# Patient Record
Sex: Female | Born: 1955 | Race: White | Hispanic: No | Marital: Married | Smoking: Never smoker
Health system: Southern US, Community
[De-identification: ages and names within clinical notes are randomized; demographics above are authoritative.]

## PROBLEM LIST (undated history)

## (undated) DIAGNOSIS — I5189 Other ill-defined heart diseases: Secondary | ICD-10-CM

## (undated) DIAGNOSIS — R350 Frequency of micturition: Secondary | ICD-10-CM

## (undated) DIAGNOSIS — I779 Disorder of arteries and arterioles, unspecified: Secondary | ICD-10-CM

## (undated) DIAGNOSIS — R319 Hematuria, unspecified: Secondary | ICD-10-CM

## (undated) DIAGNOSIS — R519 Headache, unspecified: Secondary | ICD-10-CM

## (undated) DIAGNOSIS — F329 Major depressive disorder, single episode, unspecified: Secondary | ICD-10-CM

## (undated) DIAGNOSIS — M1990 Unspecified osteoarthritis, unspecified site: Secondary | ICD-10-CM

## (undated) DIAGNOSIS — E785 Hyperlipidemia, unspecified: Secondary | ICD-10-CM

## (undated) DIAGNOSIS — L03211 Cellulitis of face: Secondary | ICD-10-CM

## (undated) DIAGNOSIS — K589 Irritable bowel syndrome without diarrhea: Secondary | ICD-10-CM

## (undated) DIAGNOSIS — I639 Cerebral infarction, unspecified: Secondary | ICD-10-CM

## (undated) DIAGNOSIS — E119 Type 2 diabetes mellitus without complications: Secondary | ICD-10-CM

## (undated) DIAGNOSIS — R0789 Other chest pain: Secondary | ICD-10-CM

## (undated) DIAGNOSIS — K219 Gastro-esophageal reflux disease without esophagitis: Secondary | ICD-10-CM

## (undated) DIAGNOSIS — R42 Dizziness and giddiness: Secondary | ICD-10-CM

## (undated) DIAGNOSIS — T7840XA Allergy, unspecified, initial encounter: Secondary | ICD-10-CM

## (undated) DIAGNOSIS — R351 Nocturia: Secondary | ICD-10-CM

## (undated) DIAGNOSIS — L0201 Cutaneous abscess of face: Secondary | ICD-10-CM

## (undated) DIAGNOSIS — I739 Peripheral vascular disease, unspecified: Secondary | ICD-10-CM

## (undated) DIAGNOSIS — G459 Transient cerebral ischemic attack, unspecified: Secondary | ICD-10-CM

## (undated) DIAGNOSIS — R32 Unspecified urinary incontinence: Secondary | ICD-10-CM

## (undated) DIAGNOSIS — R609 Edema, unspecified: Secondary | ICD-10-CM

## (undated) DIAGNOSIS — R51 Headache: Secondary | ICD-10-CM

## (undated) DIAGNOSIS — R569 Unspecified convulsions: Secondary | ICD-10-CM

## (undated) DIAGNOSIS — I2510 Atherosclerotic heart disease of native coronary artery without angina pectoris: Secondary | ICD-10-CM

## (undated) DIAGNOSIS — R3915 Urgency of urination: Secondary | ICD-10-CM

## (undated) DIAGNOSIS — F32A Depression, unspecified: Secondary | ICD-10-CM

## (undated) DIAGNOSIS — I10 Essential (primary) hypertension: Secondary | ICD-10-CM

## (undated) DIAGNOSIS — G43909 Migraine, unspecified, not intractable, without status migrainosus: Secondary | ICD-10-CM

## (undated) HISTORY — DX: Depression, unspecified: F32.A

## (undated) HISTORY — DX: Essential (primary) hypertension: I10

## (undated) HISTORY — DX: Gastro-esophageal reflux disease without esophagitis: K21.9

## (undated) HISTORY — DX: Unspecified urinary incontinence: R32

## (undated) HISTORY — DX: Hyperlipidemia, unspecified: E78.5

## (undated) HISTORY — PX: CATARACT EXTRACTION: SUR2

## (undated) HISTORY — DX: Edema, unspecified: R60.9

## (undated) HISTORY — PX: TOE SURGERY: SHX1073

## (undated) HISTORY — PX: EYE SURGERY: SHX253

## (undated) HISTORY — PX: CHOLECYSTECTOMY: SHX55

## (undated) HISTORY — PX: APPENDECTOMY: SHX54

## (undated) HISTORY — DX: Urgency of urination: R39.15

## (undated) HISTORY — DX: Unspecified osteoarthritis, unspecified site: M19.90

## (undated) HISTORY — DX: Cellulitis of face: L03.211

## (undated) HISTORY — PX: ABDOMINAL HYSTERECTOMY: SHX81

## (undated) HISTORY — PX: ABDOMINOPLASTY: SUR9

## (undated) HISTORY — DX: Cerebral infarction, unspecified: I63.9

## (undated) HISTORY — PX: SHOULDER SURGERY: SHX246

## (undated) HISTORY — DX: Frequency of micturition: R35.0

## (undated) HISTORY — DX: Major depressive disorder, single episode, unspecified: F32.9

## (undated) HISTORY — DX: Hematuria, unspecified: R31.9

## (undated) HISTORY — PX: WRIST FRACTURE SURGERY: SHX121

## (undated) HISTORY — DX: Migraine, unspecified, not intractable, without status migrainosus: G43.909

## (undated) HISTORY — DX: Cutaneous abscess of face: L02.01

## (undated) HISTORY — DX: Nocturia: R35.1

## (undated) HISTORY — PX: UPPER GI ENDOSCOPY: SHX6162

## (undated) HISTORY — DX: Irritable bowel syndrome, unspecified: K58.9

## (undated) HISTORY — PX: OTHER SURGICAL HISTORY: SHX169

## (undated) HISTORY — PX: BACK SURGERY: SHX140

## (undated) HISTORY — PX: HERNIA REPAIR: SHX51

## (undated) HISTORY — DX: Allergy, unspecified, initial encounter: T78.40XA

---

## 1998-07-17 DIAGNOSIS — I639 Cerebral infarction, unspecified: Secondary | ICD-10-CM

## 1998-07-17 HISTORY — DX: Cerebral infarction, unspecified: I63.9

## 2002-12-13 ENCOUNTER — Ambulatory Visit (HOSPITAL_COMMUNITY): Admission: RE | Admit: 2002-12-13 | Discharge: 2002-12-13 | Payer: Self-pay | Admitting: Neurosurgery

## 2003-02-02 ENCOUNTER — Encounter: Admission: RE | Admit: 2003-02-02 | Discharge: 2003-02-02 | Payer: Self-pay | Admitting: Neurosurgery

## 2003-02-02 ENCOUNTER — Encounter: Payer: Self-pay | Admitting: Diagnostic Radiology

## 2003-02-24 ENCOUNTER — Encounter: Admission: RE | Admit: 2003-02-24 | Discharge: 2003-02-24 | Payer: Self-pay | Admitting: Neurosurgery

## 2004-04-16 ENCOUNTER — Encounter: Payer: Self-pay | Admitting: Neurology

## 2004-05-17 ENCOUNTER — Encounter: Payer: Self-pay | Admitting: Neurology

## 2004-06-16 ENCOUNTER — Encounter: Payer: Self-pay | Admitting: Neurology

## 2004-07-06 ENCOUNTER — Encounter: Payer: Self-pay | Admitting: Family Medicine

## 2004-07-22 ENCOUNTER — Encounter: Payer: Self-pay | Admitting: Family Medicine

## 2004-08-17 ENCOUNTER — Encounter: Payer: Self-pay | Admitting: Family Medicine

## 2005-01-25 ENCOUNTER — Emergency Department: Payer: Self-pay | Admitting: Emergency Medicine

## 2005-07-28 ENCOUNTER — Ambulatory Visit: Payer: Self-pay | Admitting: Family Medicine

## 2005-08-14 ENCOUNTER — Inpatient Hospital Stay: Payer: Self-pay | Admitting: Internal Medicine

## 2005-08-14 ENCOUNTER — Other Ambulatory Visit: Payer: Self-pay

## 2005-09-20 ENCOUNTER — Ambulatory Visit: Payer: Self-pay

## 2005-11-05 ENCOUNTER — Other Ambulatory Visit: Payer: Self-pay

## 2005-11-05 ENCOUNTER — Emergency Department: Payer: Self-pay | Admitting: Emergency Medicine

## 2006-01-30 ENCOUNTER — Ambulatory Visit: Payer: Self-pay | Admitting: Ophthalmology

## 2006-02-25 ENCOUNTER — Emergency Department: Payer: Self-pay | Admitting: Emergency Medicine

## 2006-05-24 ENCOUNTER — Ambulatory Visit: Payer: Self-pay | Admitting: Family Medicine

## 2006-11-24 IMAGING — CR DG RIBS 2V*R*
1 series · 5 of 5 positions shown · non-contrast
Comparison: none

REASON FOR EXAM: rt rib pain rm 16
COMMENTS:

[Series 1: view not recorded · 0.17mm/px · 5 of 5 slices shown]
[im 1/5]
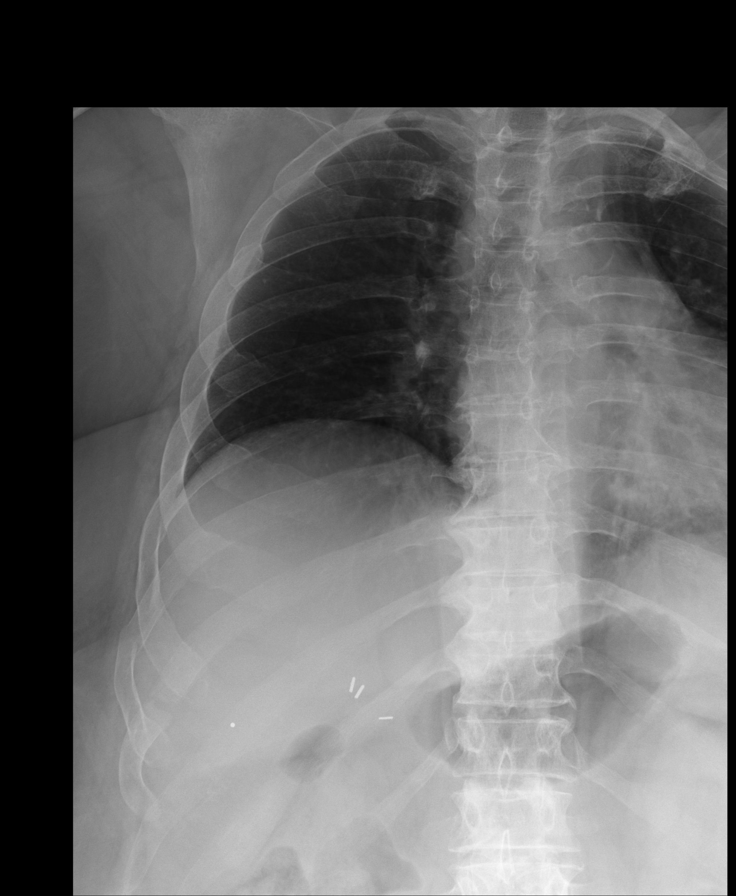
[im 2/5]
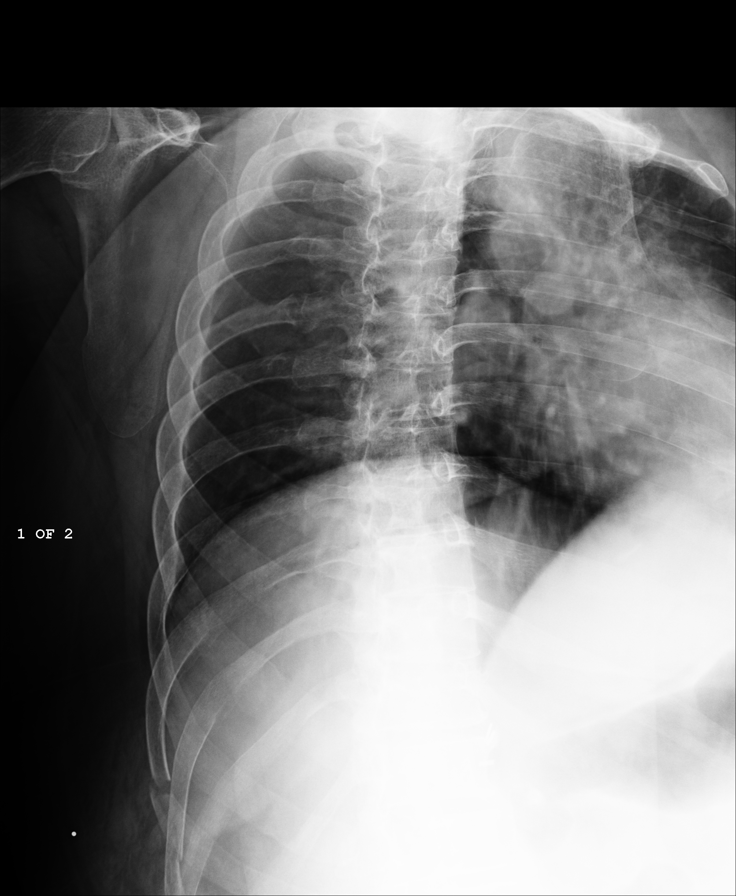
[im 3/5]
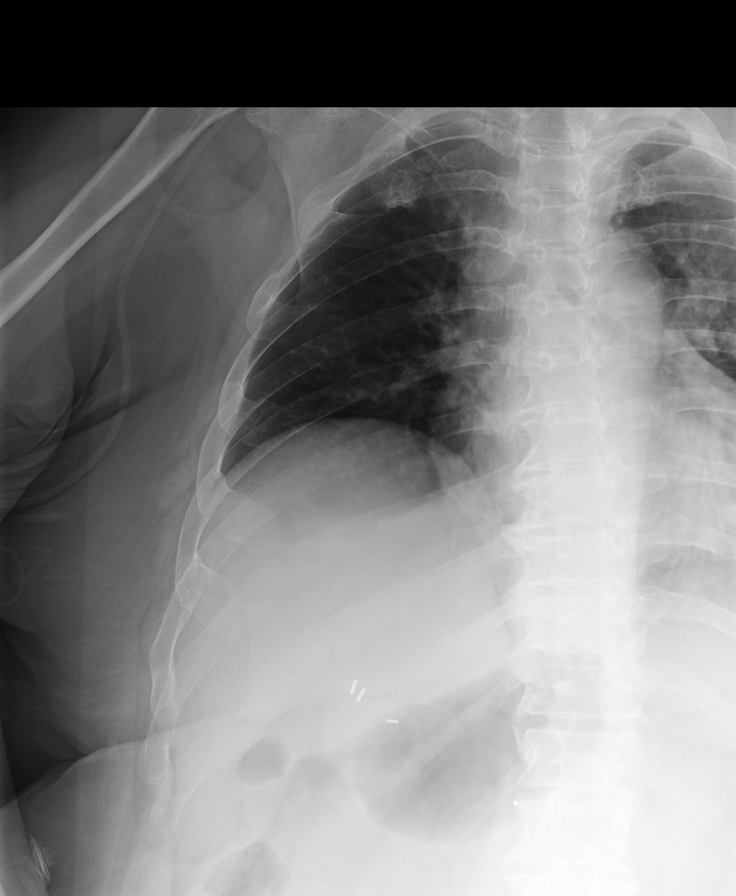
[im 4/5]
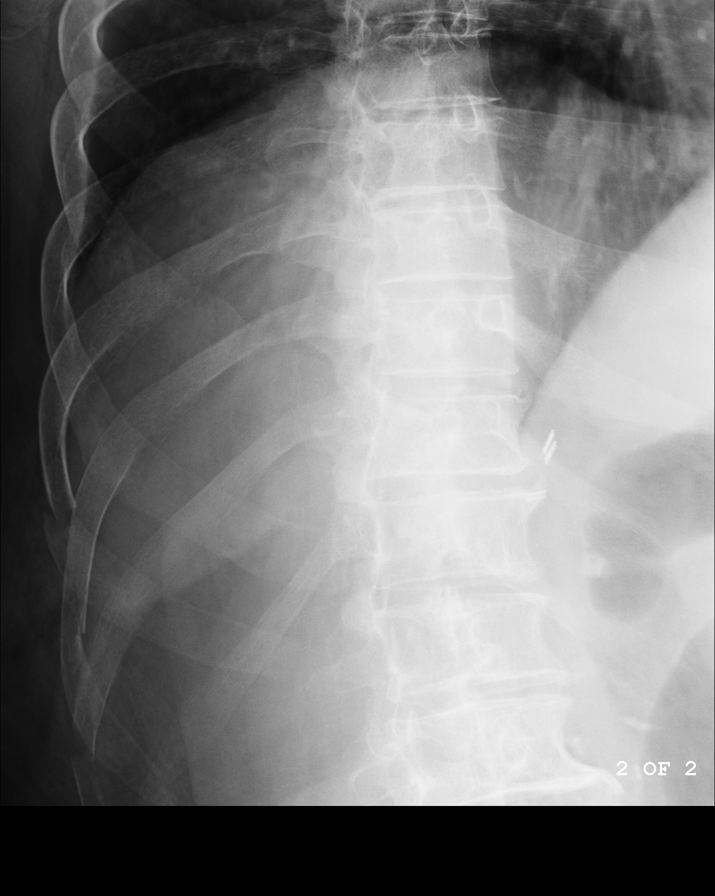
[im 5/5]
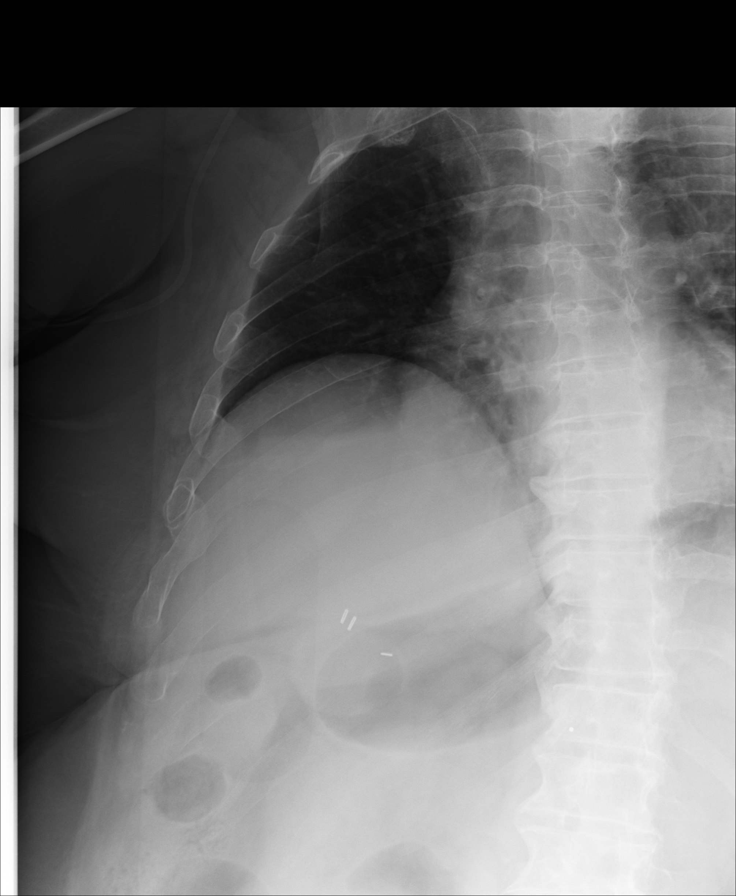

[5 of 5 positions shown; findings below may reference images not displayed]

PROCEDURE:     DXR - DXR RIBS RIGHT UNILATERAL  - January 25, 2005  [DATE]

RESULT:        There are noted minimally displaced fractures of the eighth,
ninth and tenth RIGHT ribs.   Also noted is an old appearing fracture of the
third RIGHT rib laterally.   No pneumothorax or pleural effusion on the
RIGHT is seen.
IMPRESSION: There are fractures of the eighth, ninth and tenth RIGHT ribs as noted above.

## 2007-01-02 ENCOUNTER — Ambulatory Visit: Payer: Self-pay | Admitting: Gastroenterology

## 2007-01-15 ENCOUNTER — Ambulatory Visit: Payer: Self-pay | Admitting: Internal Medicine

## 2007-01-28 ENCOUNTER — Ambulatory Visit: Payer: Self-pay | Admitting: Internal Medicine

## 2007-02-15 ENCOUNTER — Ambulatory Visit: Payer: Self-pay | Admitting: Internal Medicine

## 2007-02-21 ENCOUNTER — Other Ambulatory Visit: Payer: Self-pay

## 2007-02-21 ENCOUNTER — Ambulatory Visit: Payer: Self-pay | Admitting: Podiatry

## 2007-02-25 ENCOUNTER — Ambulatory Visit: Payer: Self-pay | Admitting: Family Medicine

## 2007-02-27 ENCOUNTER — Ambulatory Visit: Payer: Self-pay | Admitting: Podiatry

## 2007-03-19 ENCOUNTER — Inpatient Hospital Stay: Payer: Self-pay | Admitting: Internal Medicine

## 2007-03-19 ENCOUNTER — Other Ambulatory Visit: Payer: Self-pay

## 2007-05-03 ENCOUNTER — Ambulatory Visit: Payer: Self-pay | Admitting: Family Medicine

## 2007-05-27 IMAGING — CT CT CHEST-ABD W/ CM
1 of 2 series · 13 of 32 positions shown, 18 images · non-contrast
Comparison: none

REASON FOR EXAM: RUQ pain and side pain, weight loss_ pt. allergic to dye_
CT relayed pre-med...
COMMENTS:

[Series 2: soft tissue · axial · 0.82mm/px · z∈[-744,-364]mm · 13 of 88 slices shown, 18 images]
[im 6/88  mediastinal]
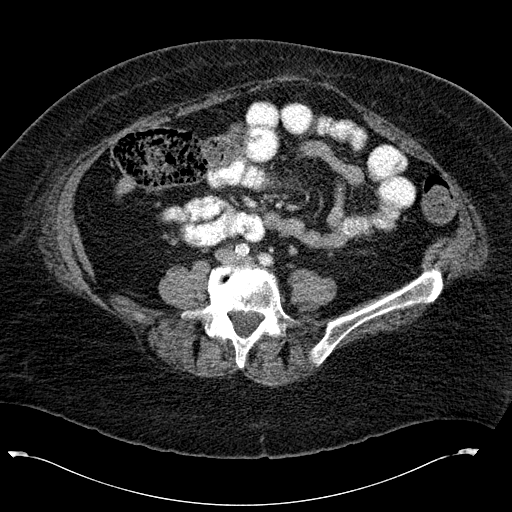
[im 6/88  bone]
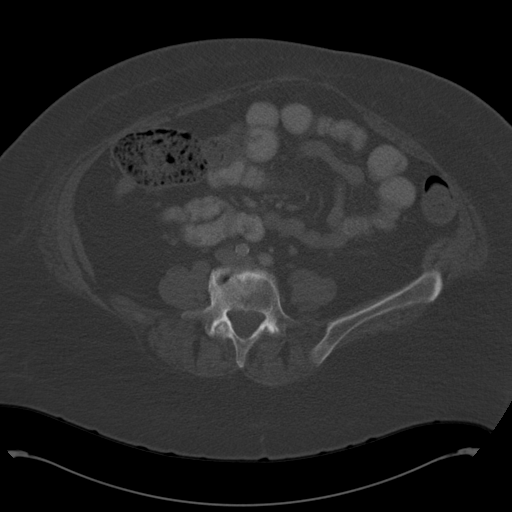
[im 17/88  mediastinal]
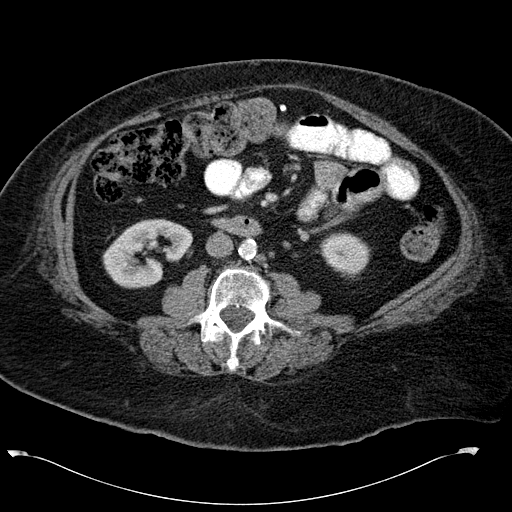
[im 22/88  mediastinal]
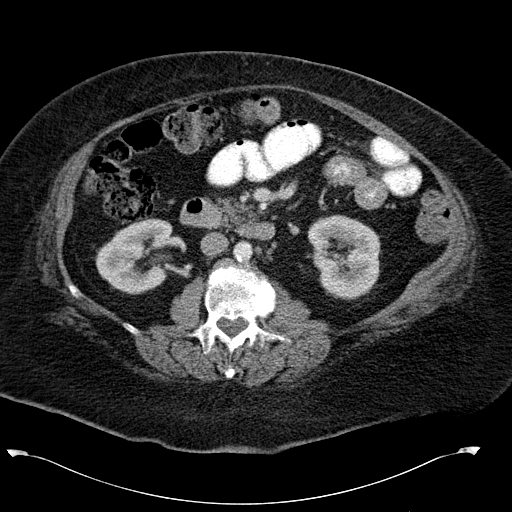
[im 30/88  mediastinal]
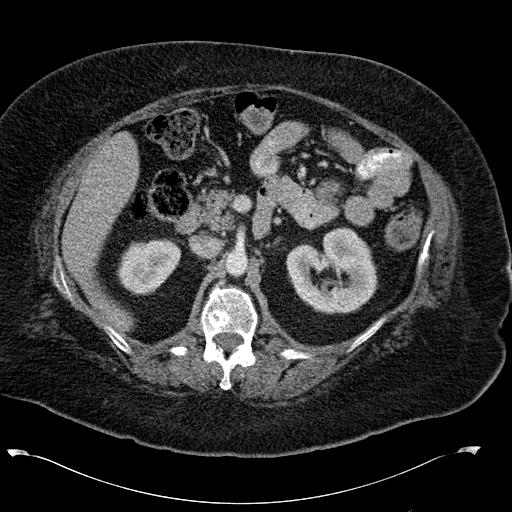
[im 33/88  mediastinal]
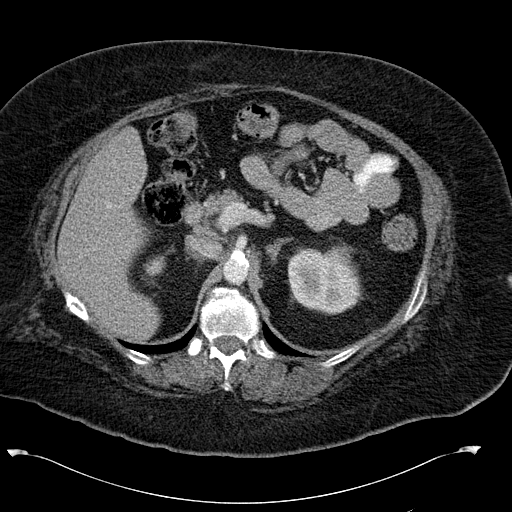
[im 43/88  mediastinal]
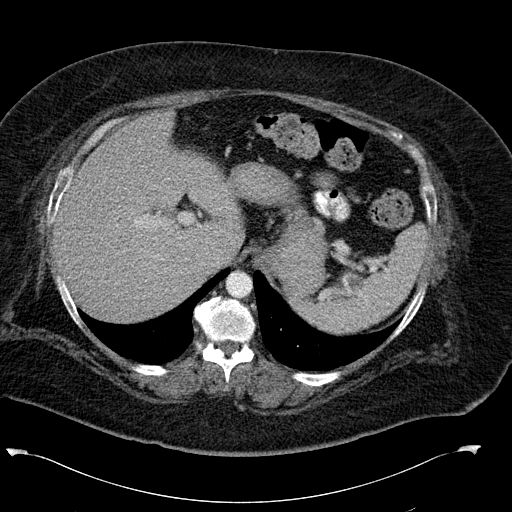
[im 44/88  mediastinal]
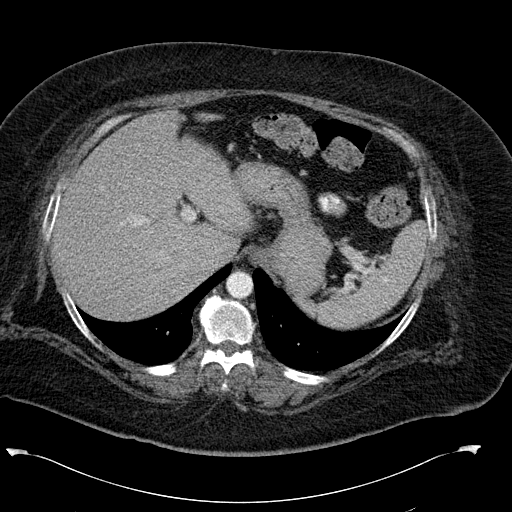
[im 55/88  mediastinal]
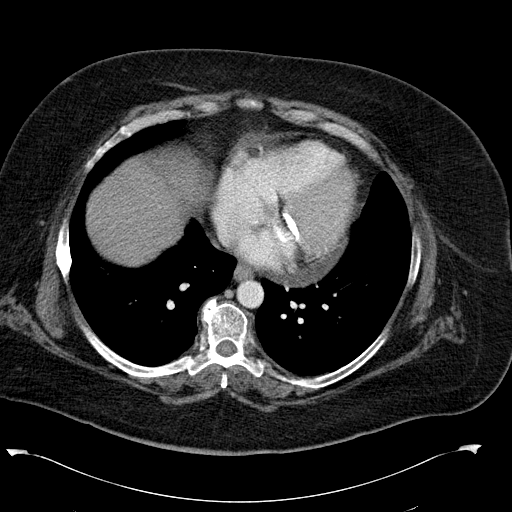
[im 59/88  mediastinal]
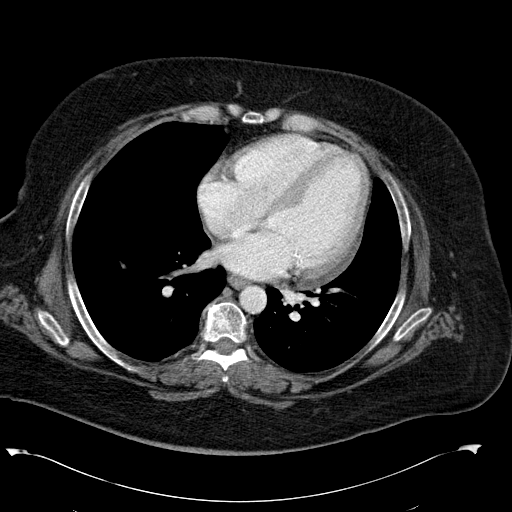
[im 59/88  bone]
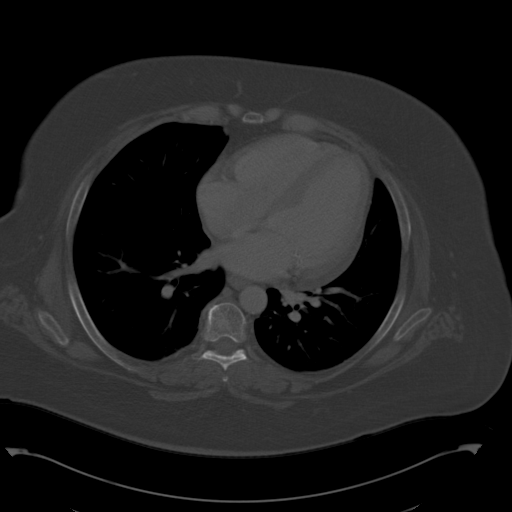
[im 66/88  mediastinal]
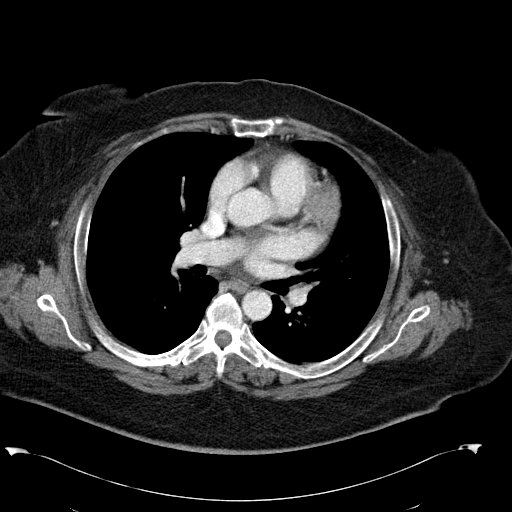
[im 66/88  lung]
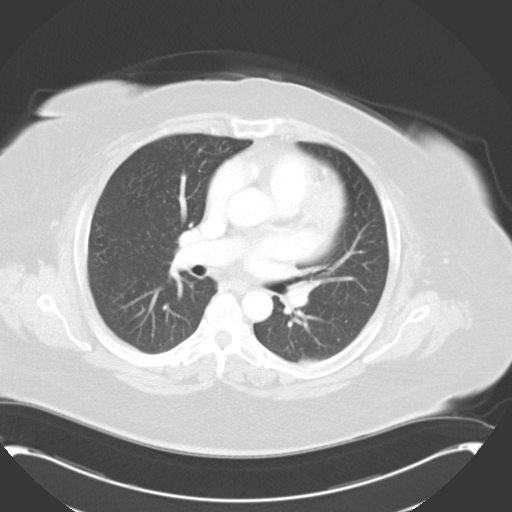
[im 71/88  mediastinal]
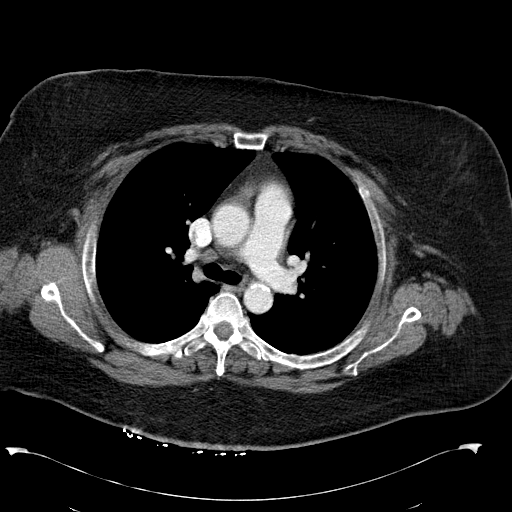
[im 71/88  lung]
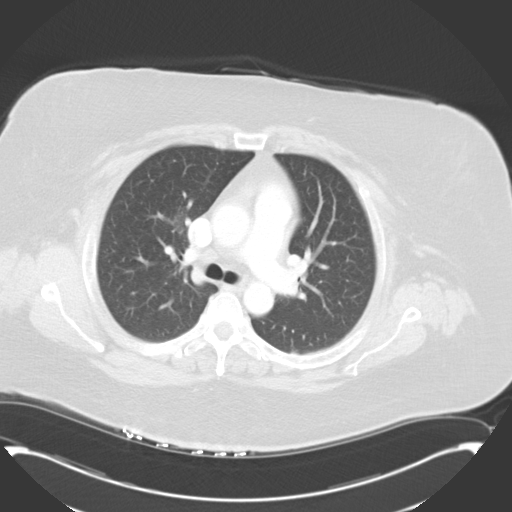
[im 77/88  lung]
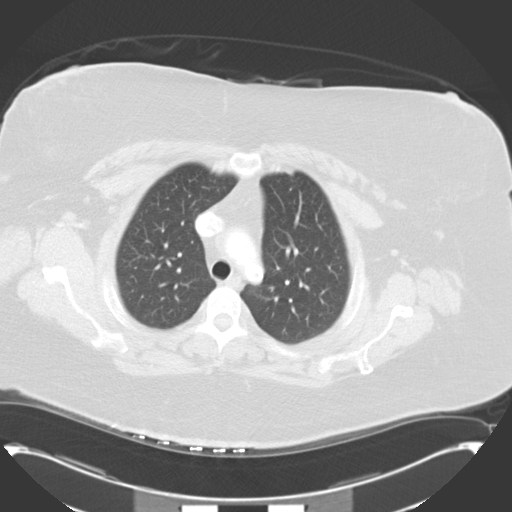
[im 82/88  mediastinal]
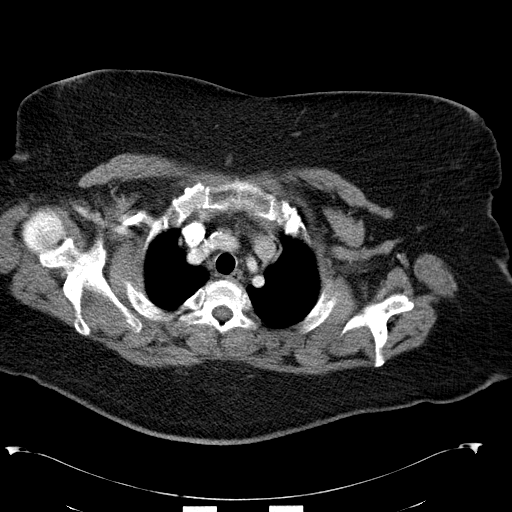
[im 82/88  lung]
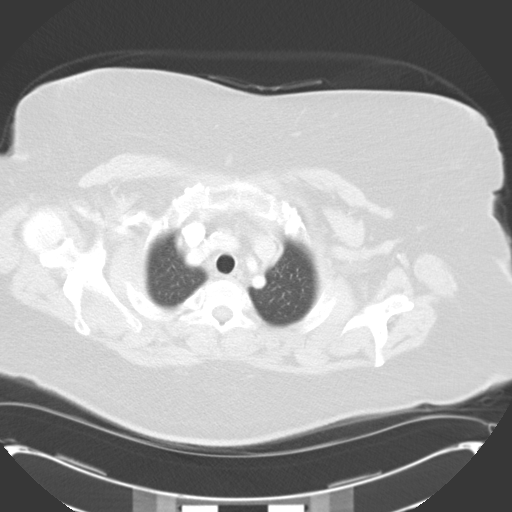

[13 of 32 positions shown; findings below may reference images not displayed]

PROCEDURE:     CT  - CT CHEST AND ABDOMEN W  - July 28, 2005  [DATE]

RESULT:     The patient has unexplained RIGHT upper quadrant discomfort as
well as weight loss. The patient received premedication due to contrast
sensitivity.  The patient received 100 ml of Isovue 370 for this study.

CT SCAN OF THE CHEST:  Within the mediastinum I see no pathologic sized
lymph nodes.  The cardiac chambers are normal in size. There is no pleural
nor pericardial effusion. The caliber of the thoracic aorta is normal. The
pulmonary apices exhibit very minimal scarring on the RIGHT at lung window
settings.  Elsewhere, the lungs exhibit no evidence of atelectasis or
infiltrate nor abnormal nodules. No bronchiectasis is evident. There is no
intrathoracic goiter. I see no axillary lymphadenopathy.

CT SCAN OF THE ABDOMEN:  The patient apparently ingested only a small amount
of the oral contrast.  None is present in the upper gastrointestinal tract.
There is some in the distal mid and distal small bowel, but none is in the
colon.  The liver exhibits normal density with no mass or ductal dilation.
The gallbladder is surgically absent.  The pancreas exhibits no focal mass
nor evidence of inflammatory change.  The spleen, non-distended stomach,
adrenal glands, and kidneys are normal in appearance. The caliber of the
thoracic aorta is normal. The periaortic and pericaval regions appeared
normal.  The partially contrast filled loops of small bowel are normal where
visualized.  The observed portions of the colon appeared normal. No ascites
is seen. There are no findings suspicious for omental caking.
IMPRESSION: 1)I do not see evidence of CHF nor of pneumonia.

2)I see no evidence of mediastinal nor hilar mass.

3)Not mentioned above is moderate degenerative change of the thoracic spine
manifested by osteophytes.

4)I do not see evidence of an intraabdominal mass.

5)I do not see evidence of acute hepatobiliary disease nor acute renal
abnormality.

6)The observed portions of the bowel are normal in appearance. Please note
that this study did not include the pelvis.

## 2007-06-14 IMAGING — MR MRI HEAD WITHOUT AND WITH CONTRAST
9 series · 48 of 48 positions shown · IV contrast (20ML MAGNEVIST)
Comparison: none

REASON FOR EXAM: cva
COMMENTS:

[Series 3: t1_sag · sagittal · 5.0mm · 0.86mm/px · 4 of 20 slices shown]
[im 1/20]
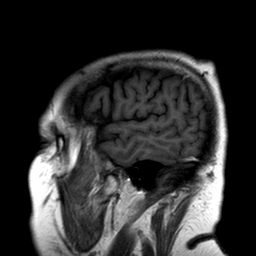
[im 7/20]
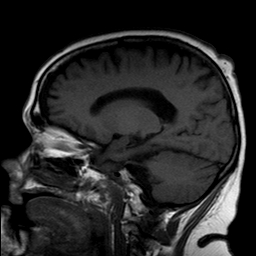
[im 13/20]
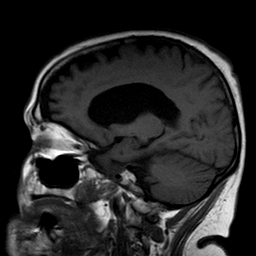
[im 20/20]
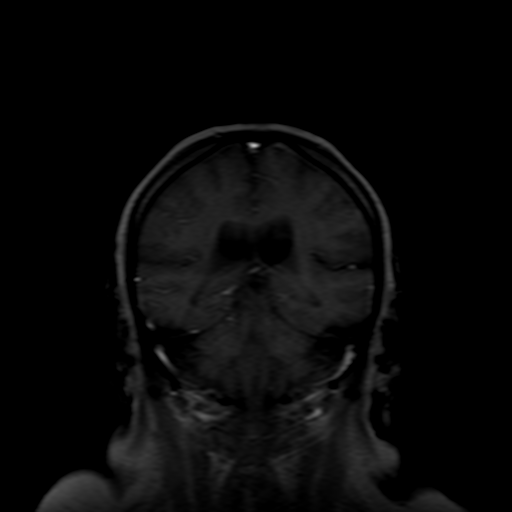

[Series 11: T1 · axial · 5.0mm · 0.86mm/px · z∈[-63,+95]mm · 5 of 24 slices shown (1 of 3)]
[im 1/24]
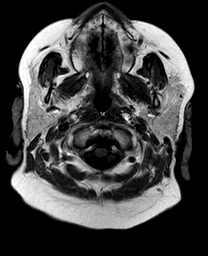
[im 6/24]
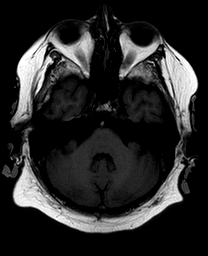
[im 12/24]
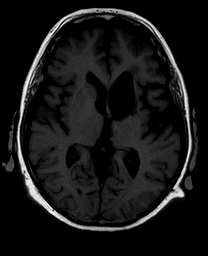
[im 18/24]
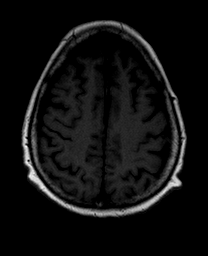
[im 24/24]
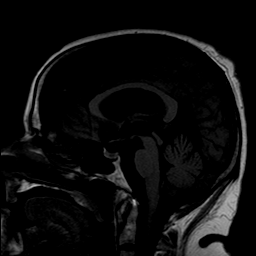

[Series 13: T2 · axial · 5.0mm · 0.43mm/px · z∈[-63,+95]mm · 5 of 24 slices shown]
[im 1/24]
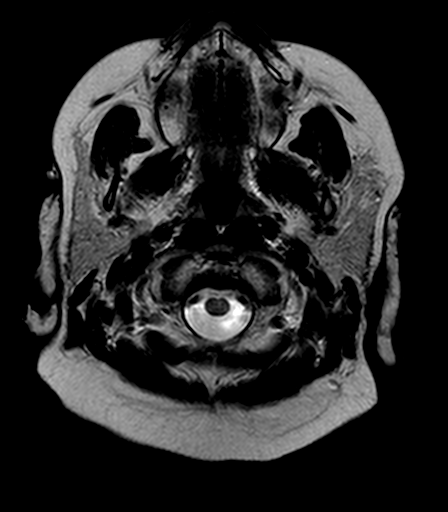
[im 6/24]
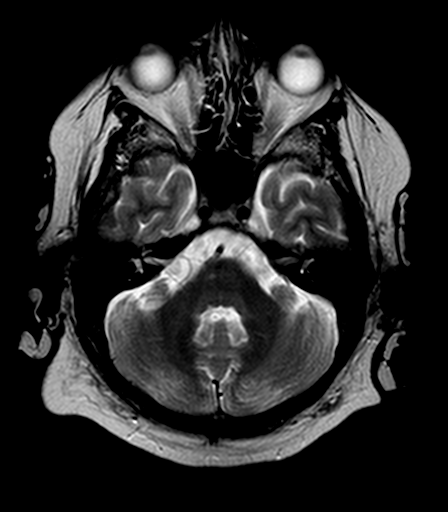
[im 12/24]
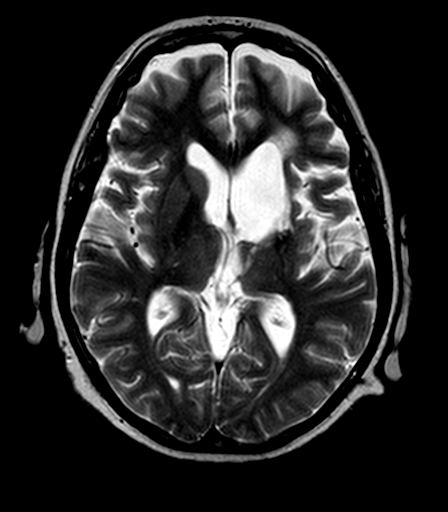
[im 18/24]
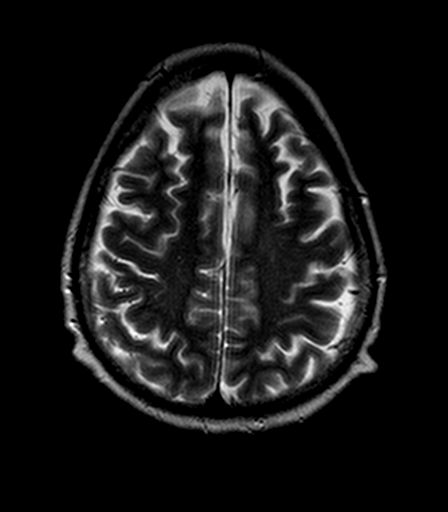
[im 24/24]
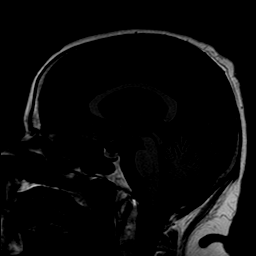

[Series 15: FLAIR · axial · 5.0mm · 0.86mm/px · z∈[-63,+95]mm · 6 of 24 slices shown]
[im 1/24]
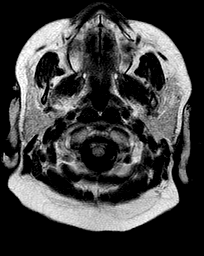
[im 5/24]
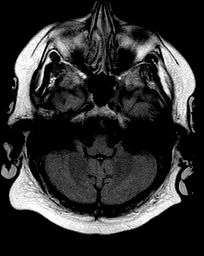
[im 10/24]
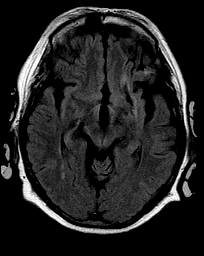
[im 14/24]
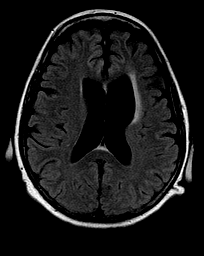
[im 19/24]
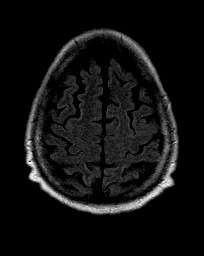
[im 24/24]
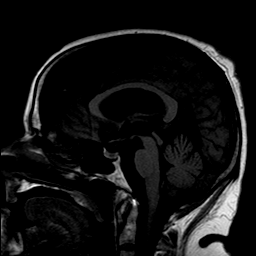

[Series 17: t1_cor · sagittal · 5.0mm · 0.86mm/px · 6 of 24 slices shown]
[im 1/24]
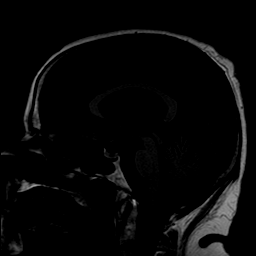
[im 5/24]
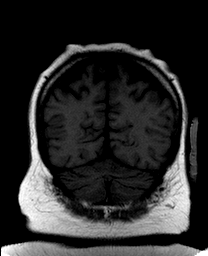
[im 10/24]
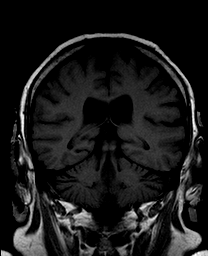
[im 14/24]
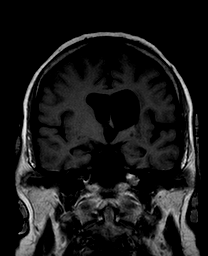
[im 19/24]
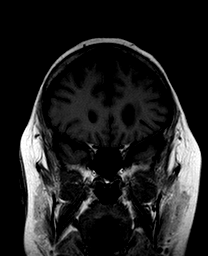
[im 24/24]
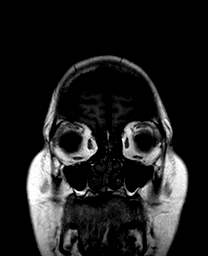

[Series 19: T1 · axial · 5.0mm · 0.43mm/px · z∈[-63,+95]mm · 6 of 24 slices shown (2 of 3)]
[im 1/24]
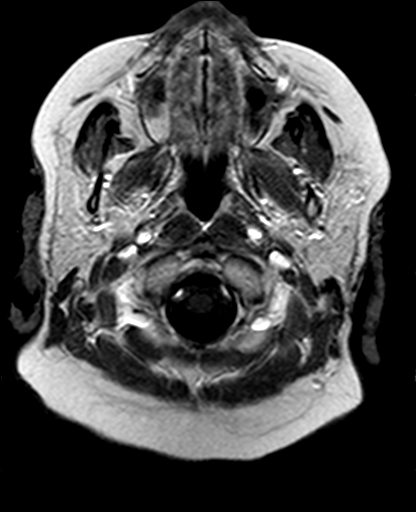
[im 5/24]
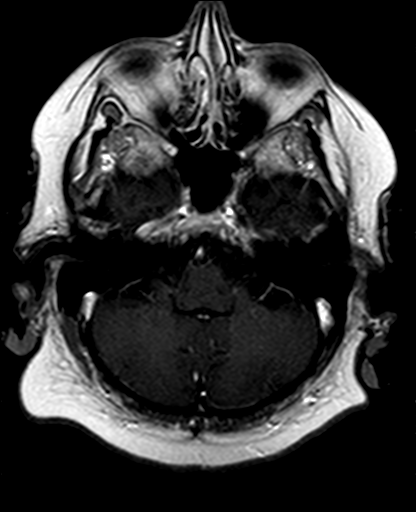
[im 10/24]
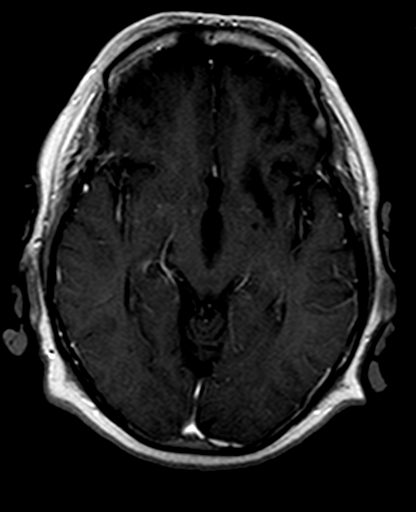
[im 14/24]
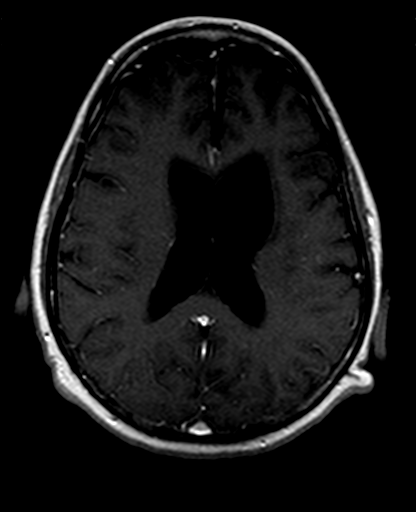
[im 19/24]
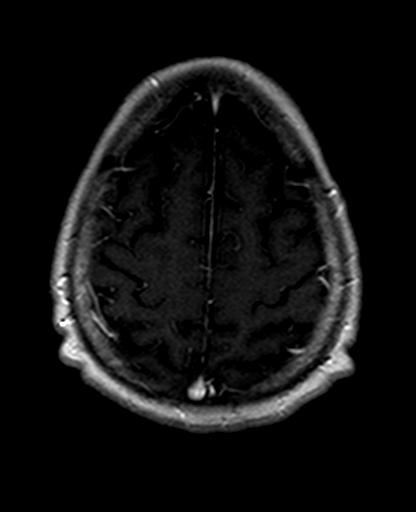
[im 24/24]
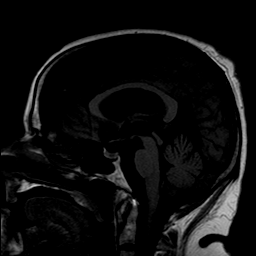

[Series 21: T1 · sagittal · 5.0mm · 0.86mm/px · 6 of 24 slices shown (3 of 3)]
[im 1/24]
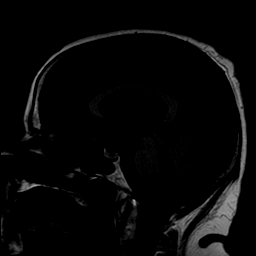
[im 5/24]
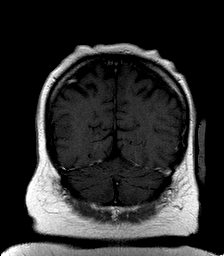
[im 10/24]
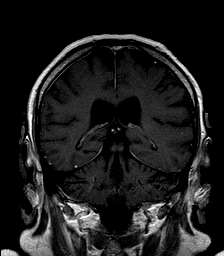
[im 14/24]
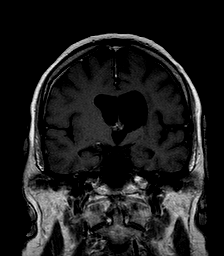
[im 19/24]
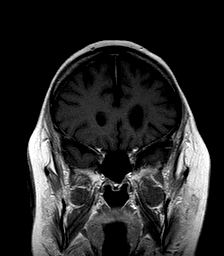
[im 24/24]
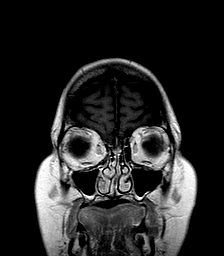

[Series 5002: DWI · axial · 5.0mm · 1.80mm/px · z∈[-59,+95]mm · 5 of 22 slices shown]
[im 1/22]
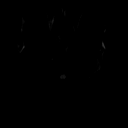
[im 6/22]
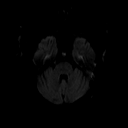
[im 11/22]
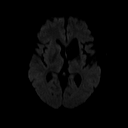
[im 16/22]
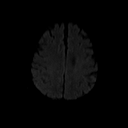
[im 22/22]
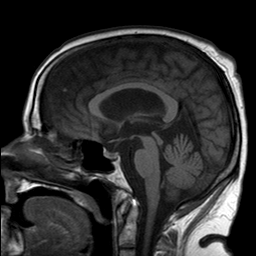

[Series 5003: ADC · axial · 5.0mm · 1.80mm/px · z∈[-59,+95]mm · 5 of 22 slices shown]
[im 1/22]
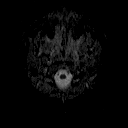
[im 6/22]
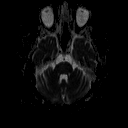
[im 11/22]
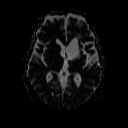
[im 16/22]
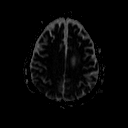
[im 22/22]
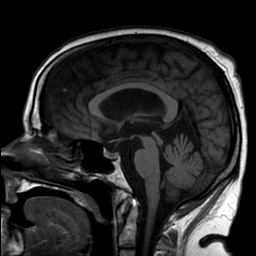

[48 of 48 positions shown; findings below may reference images not displayed]

PROCEDURE:     MR  - MR BRAIN WO/W CONTRAST  - August 15, 2005 [DATE]

RESULT:     The patient underwent CT scanning of the brain on [DATE],
which revealed no acute abnormality, but evidence of old basal ganglia
infarction on the LEFT.

Multiplanar images were obtained through the brain both prior to and
following administration of Gadolinium.  The diffusion-weighted images
reveal mild ex vacuo dilation of the LEFT lateral ventricle.  There is no
evidence of an acute ischemic infarction.  The inversion recovery-FLAIR
sequences reveal very mildly increased periventricular white matter signal
bilaterally with more increased signal noted adjacent to the dilated frontal
horn of the lateral ventricle on the LEFT.  There are no findings to suggest
a demyelinating process. The standard spin echo images reveal very mild
cerebral atrophy. The cerebellum does not demonstrate atrophic change.
Following Gadolinium administration the enhancement pattern of the brain
parenchyma is normal. The 7th and 8th cranial nerves are normal in
appearance at the level of the cerebellopontine angles. The T2 weighted
images reveal no significant inflammatory change in the paranasal sinuses.
The sella and parasellar structures are normal in appearance.
IMPRESSION: 1)There is evidence of ex vacuo dilation of the frontal horn of the LEFT
lateral ventricle consistent with prior ischemic insult involving the basal
ganglia on the LEFT.  No acute infarction is seen.

2)There is no evidence of an intracranial mass nor intracranial hemorrhage
nor of a demyelinating process.

3)Review of the report of the prior MRI of the brain from 12 February, 2001
suggests that there has not been significant interval change.

## 2007-06-17 ENCOUNTER — Ambulatory Visit: Payer: Self-pay | Admitting: Orthopedic Surgery

## 2007-06-19 ENCOUNTER — Ambulatory Visit: Payer: Self-pay | Admitting: Orthopedic Surgery

## 2007-08-18 ENCOUNTER — Emergency Department: Payer: Self-pay | Admitting: Emergency Medicine

## 2007-09-04 IMAGING — CT CT HEAD WITHOUT CONTRAST
2 series · 16 of 30 positions shown, 20 images · non-contrast
Comparison: none

REASON FOR EXAM: Dizzy
COMMENTS:  LMP: hyster

[Series 2: without · axial · non-contrast · 0.42mm/px · z∈[-136,-16]mm · 13 of 30 slices shown, 17 images]
[im 3/30  brain]
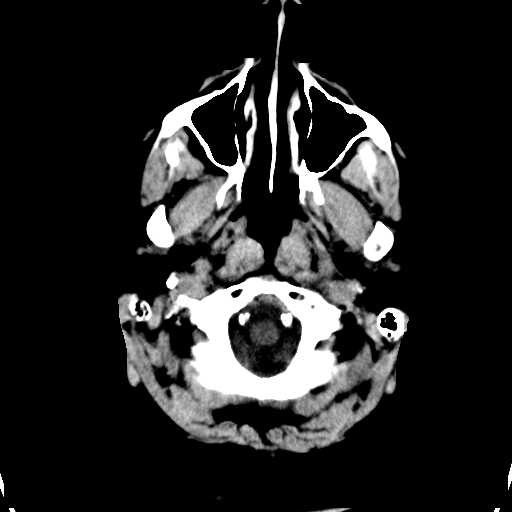
[im 3/30  bone]
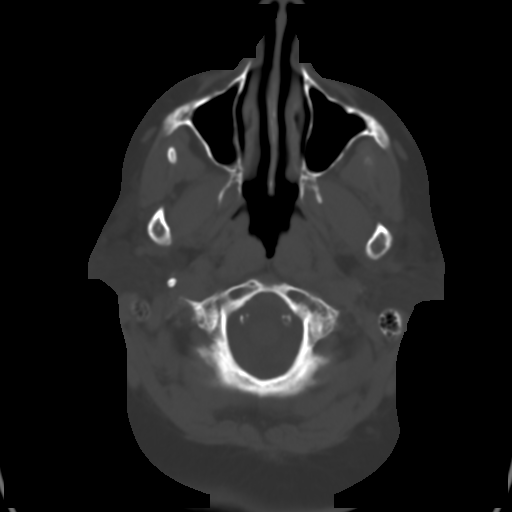
[im 5/30  brain]
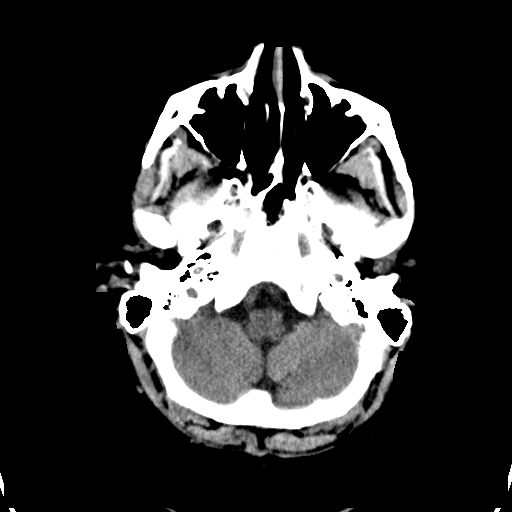
[im 7/30  brain]
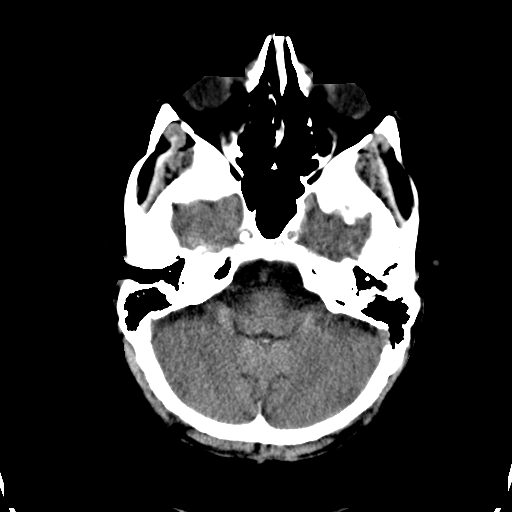
[im 9/30  brain]
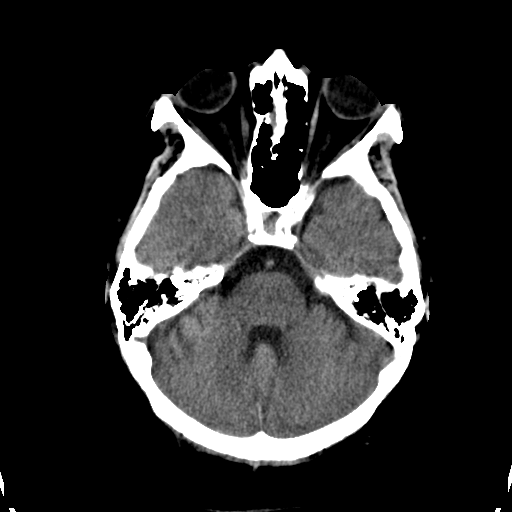
[im 11/30  brain]
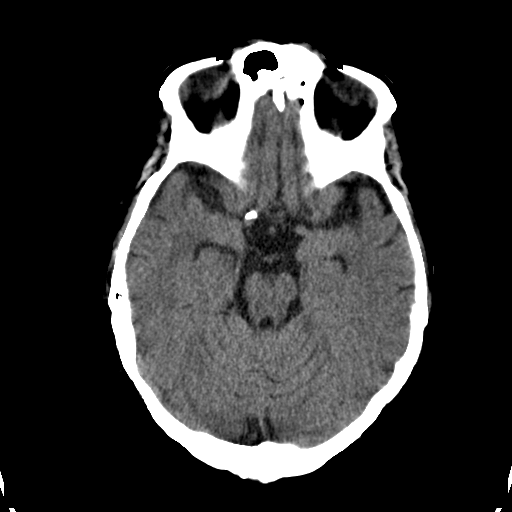
[im 11/30  bone]
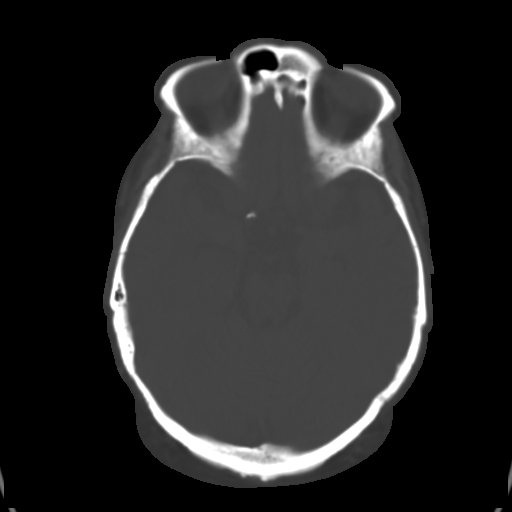
[im 13/30  brain]
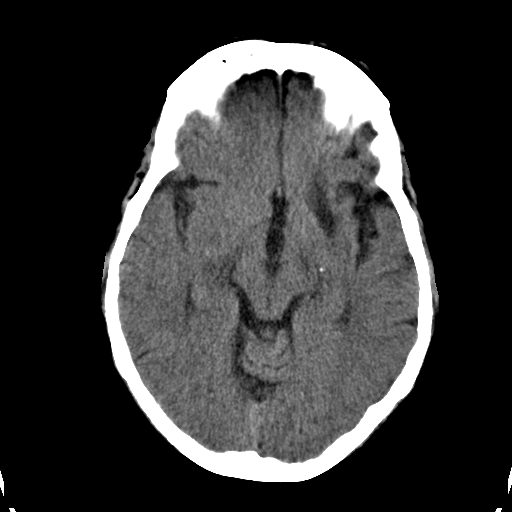
[im 15/30  brain]
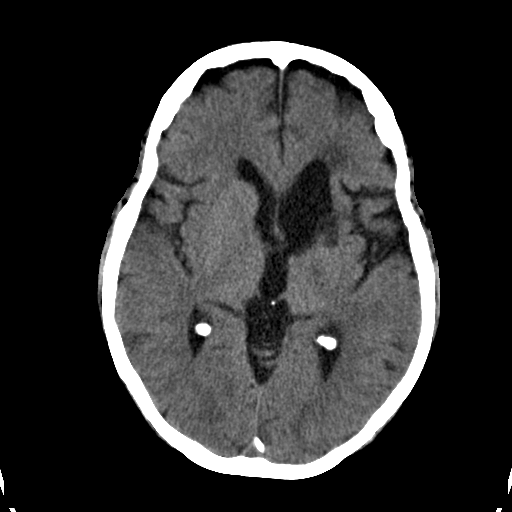
[im 17/30  brain]
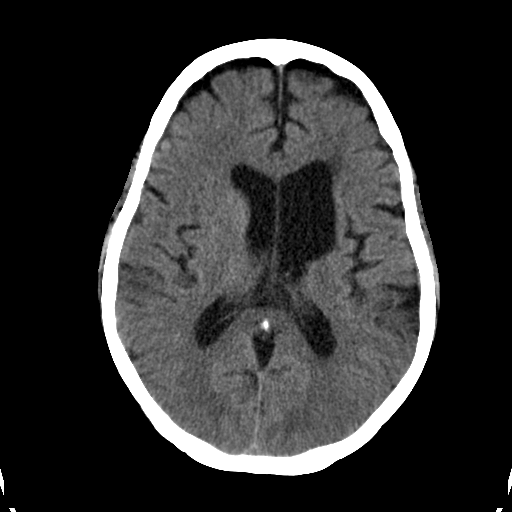
[im 19/30  brain]
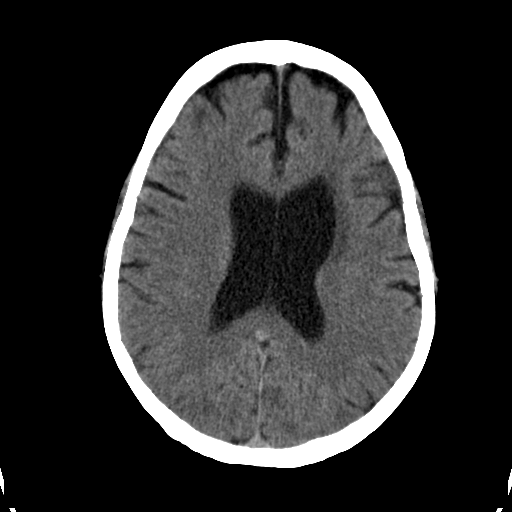
[im 19/30  bone]
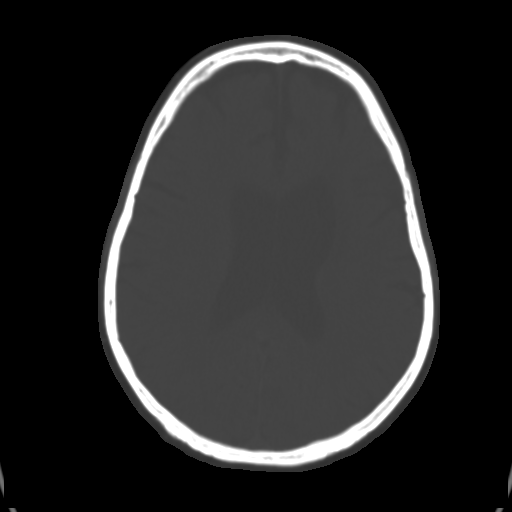
[im 21/30  brain]
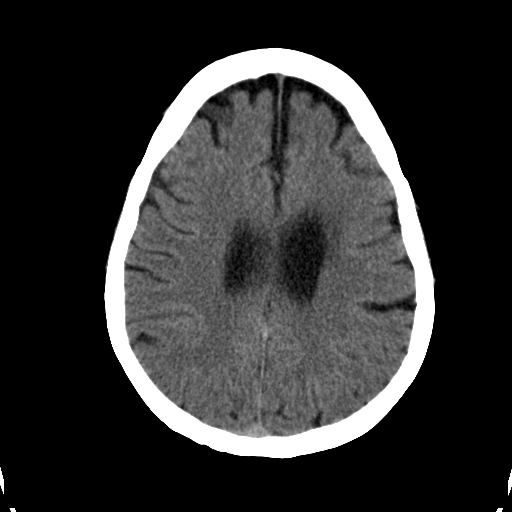
[im 23/30  brain]
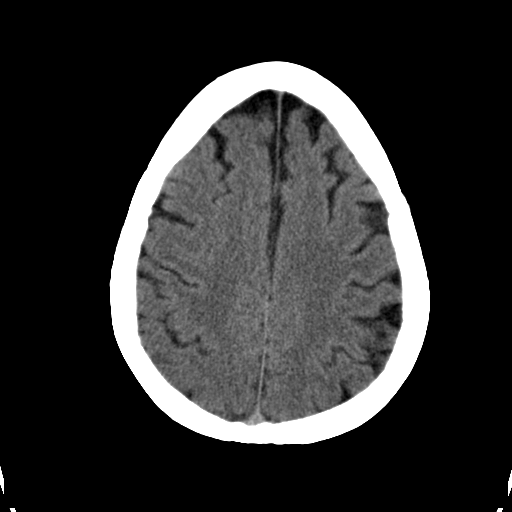
[im 25/30  brain]
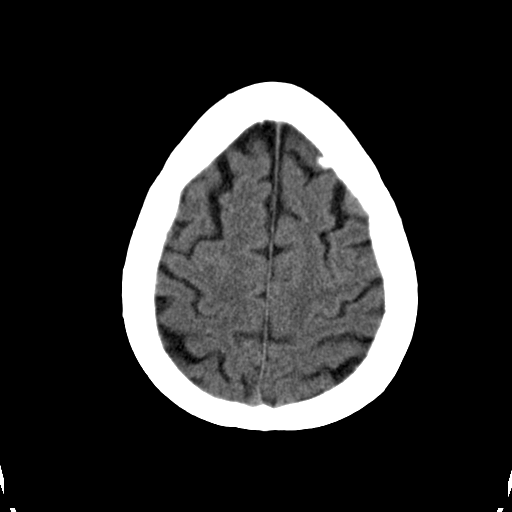
[im 27/30  brain]
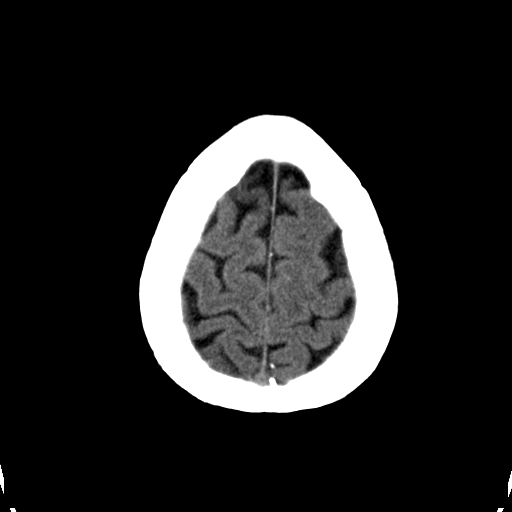
[im 27/30  bone]
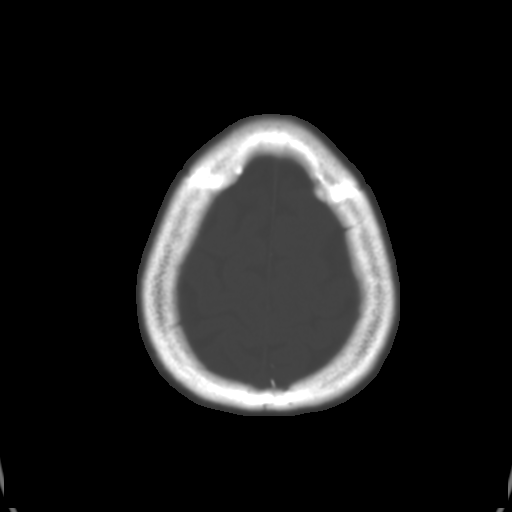

[Series 3: bone · axial · 0.42mm/px · z∈[-136,-96]mm · 3 of 30 slices shown]
[im 3/30  bone]
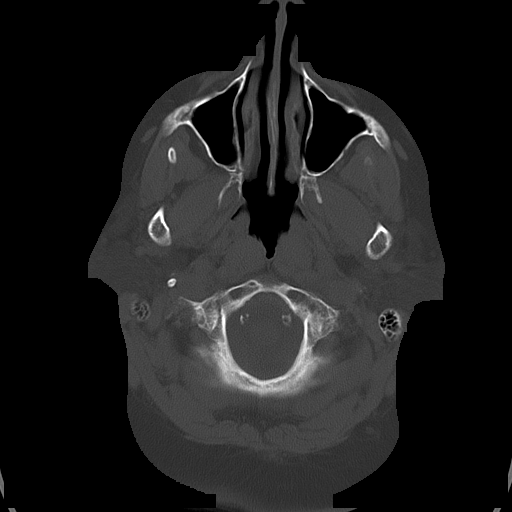
[im 7/30  bone]
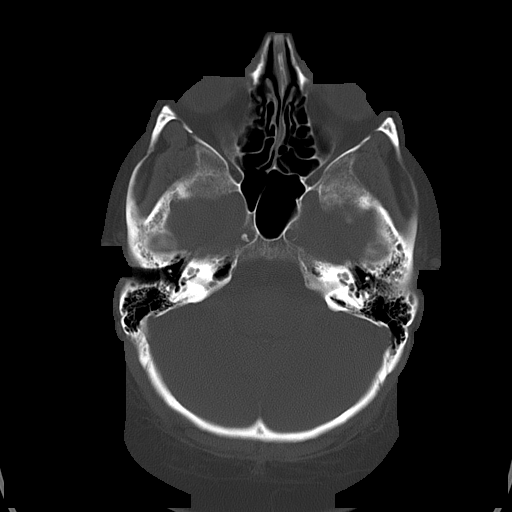
[im 11/30  bone]
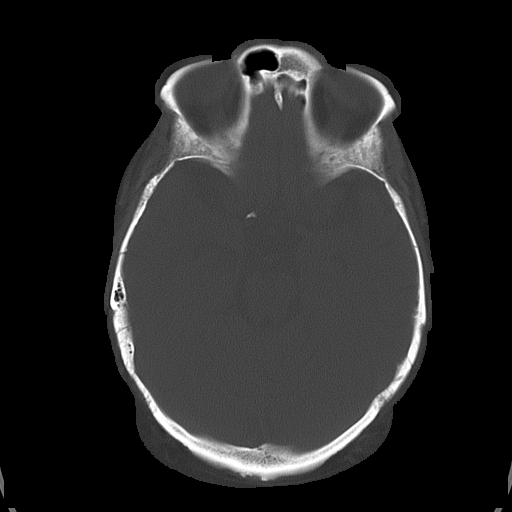

[16 of 30 positions shown; findings below may reference images not displayed]

PROCEDURE:     CT  - CT HEAD WITHOUT CONTRAST  - November 05, 2005  [DATE]

RESULT:          The patient is complaining of dizziness.  Comparison is
made to a study 14 August, 2005.

There remains ex vacuo dilation of the frontal horn of the LEFT lateral
ventricle secondary to prior ischemic insult in the anterior aspect of the
basal ganglia and adjacent caudate nucleus.  There is no shift of the
midline.  There is no intracranial hemorrhage.  There are no findings to
suggest an acute evolving ischemic infarction.  At bone window settings, I
do not see evidence of an acute skull fracture.
IMPRESSION: There are chronic changes present in the LEFT basal
ganglia, but I do not see evidence of an acute ischemic or hemorrhagic
event.

A preliminary report was sent to the emergency room at the conclusion of the
study.

## 2007-11-06 ENCOUNTER — Ambulatory Visit: Payer: Self-pay | Admitting: Ophthalmology

## 2007-11-06 ENCOUNTER — Other Ambulatory Visit: Payer: Self-pay

## 2007-11-12 ENCOUNTER — Ambulatory Visit: Payer: Self-pay | Admitting: Ophthalmology

## 2008-03-22 IMAGING — US US EXTREM LOW VENOUS*R*
1 series · 17 of 24 positions shown · non-contrast
Comparison: none

REASON FOR EXAM: RIGHT leg pain and swelling  CALL report   [id number]
COMMENTS:

PROCEDURE:     US  - US DOPPLER LOW EXTR RIGHT  - May 24, 2006  [DATE]
RESULT:     The phasic, augmentation, and Valsalva flow waveforms are
normal.  The RIGHT femoral and popliteal vein shows normal compressibility.
Doppler examination shows no occlusion or deep venous thrombosis.

[Series 1: us extrem low venous*right* · 17 of 29 slices shown]
[im 1/29]
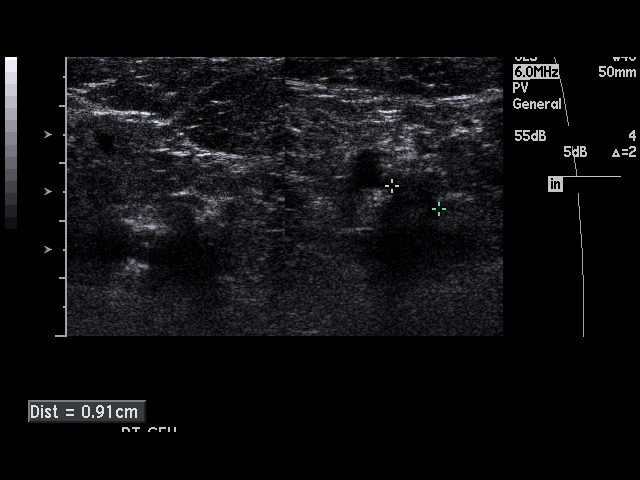
[im 3/29]
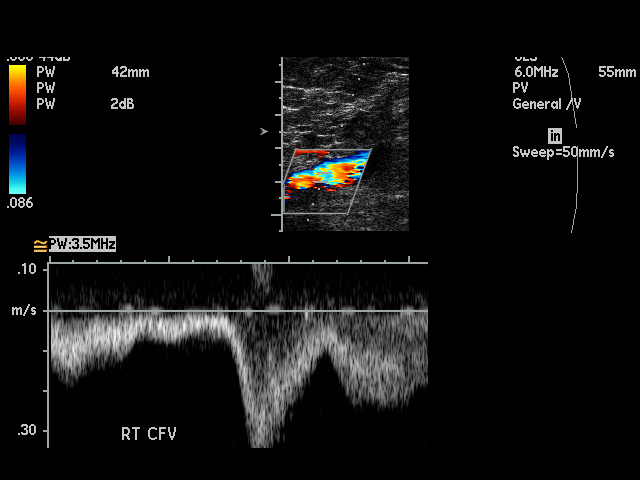
[im 4/29]
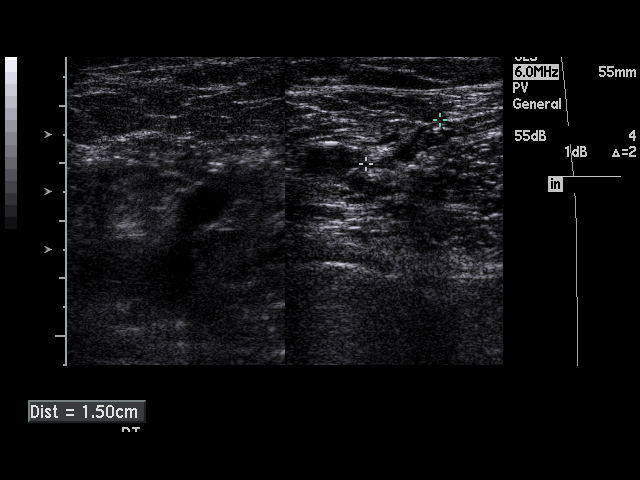
[im 5/29]
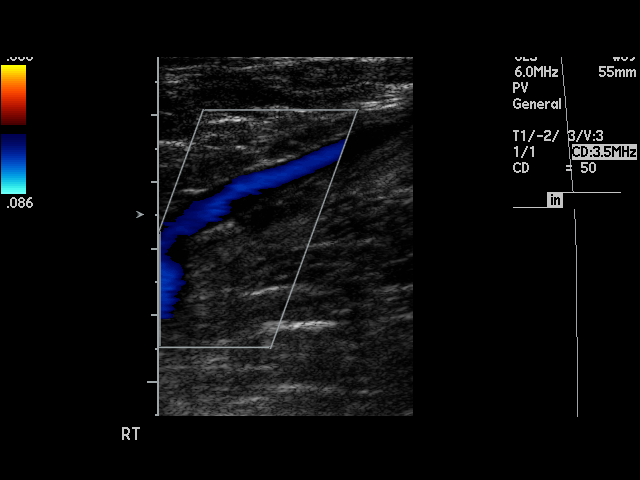
[im 8/29]
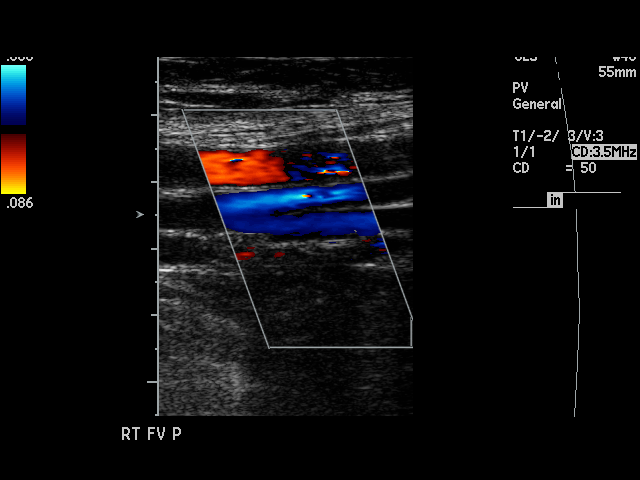
[im 9/29]
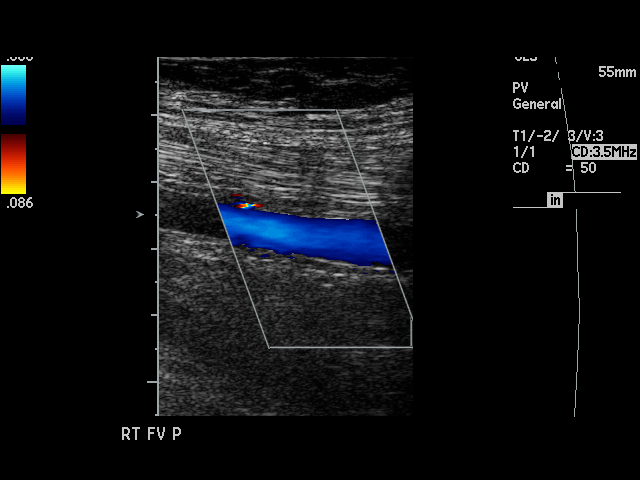
[im 11/29]
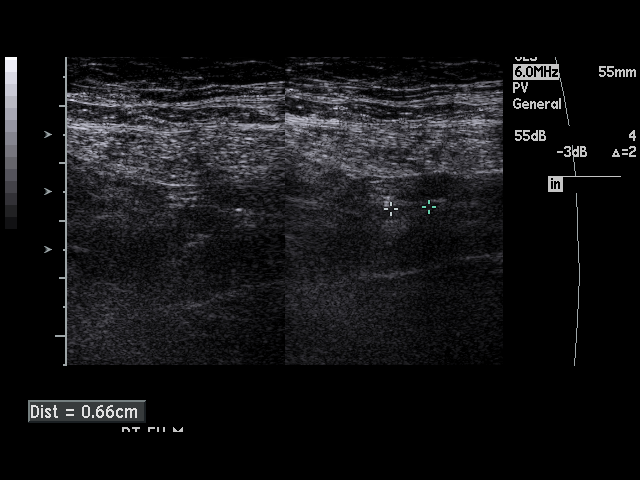
[im 13/29]
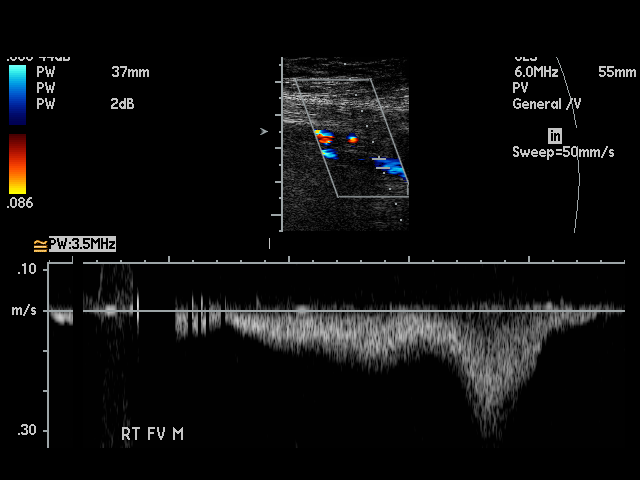
[im 15/29]
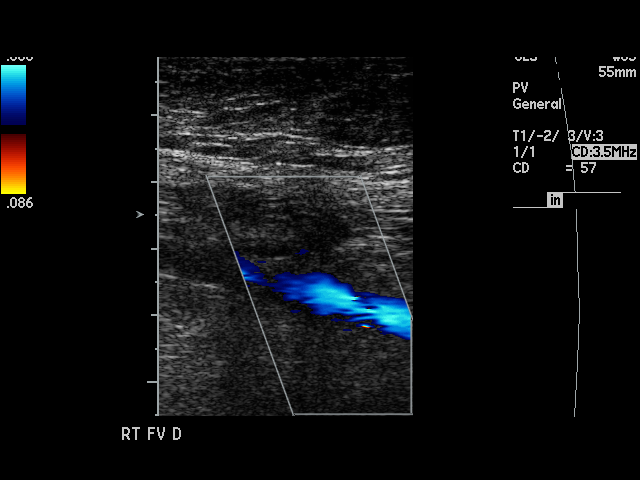
[im 16/29]
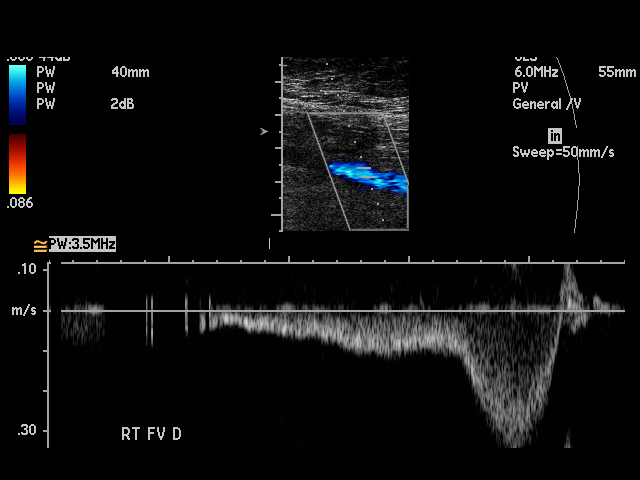
[im 18/29]
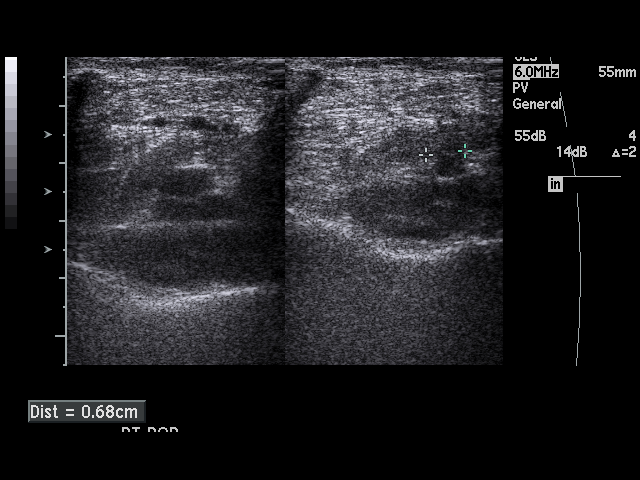
[im 20/29]
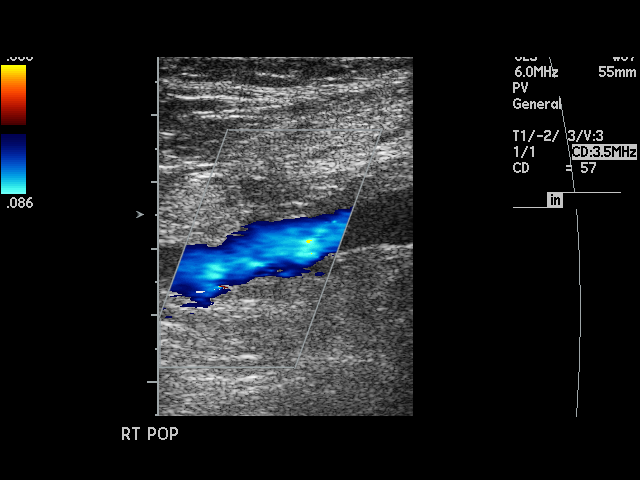
[im 21/29]
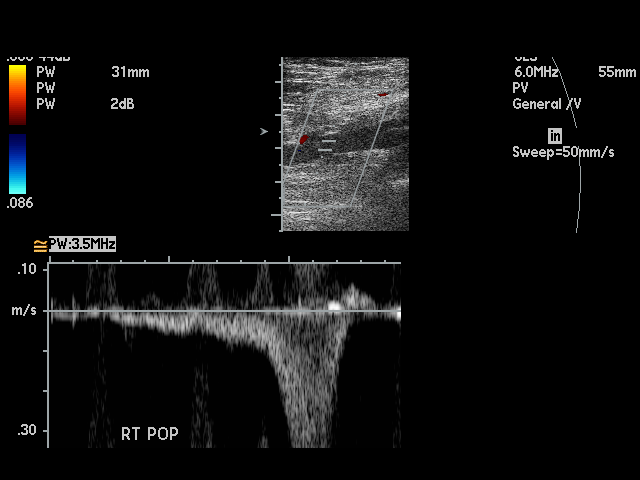
[im 24/29]
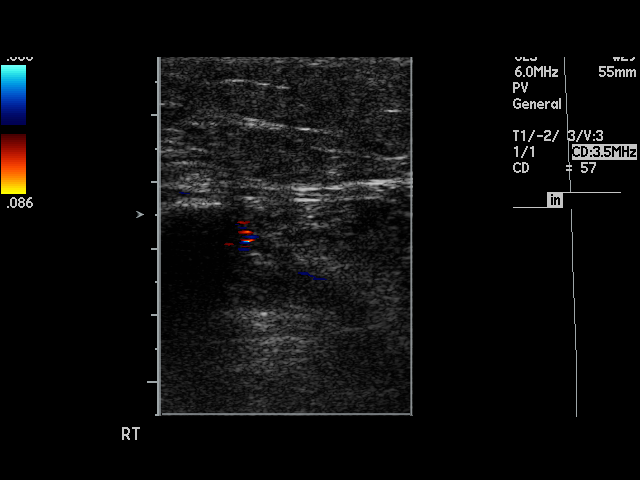
[im 25/29]
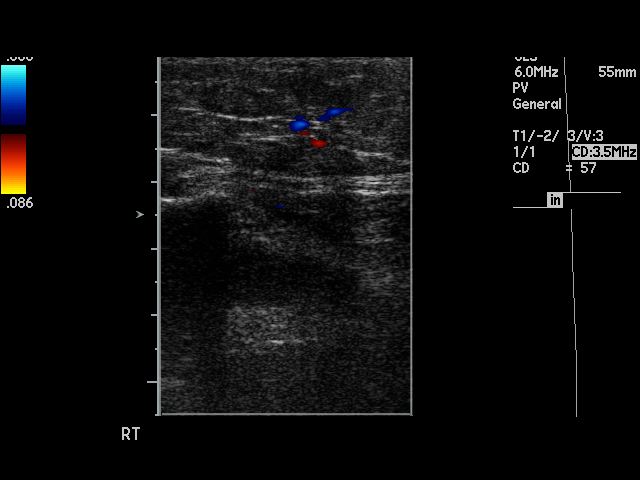
[im 26/29]
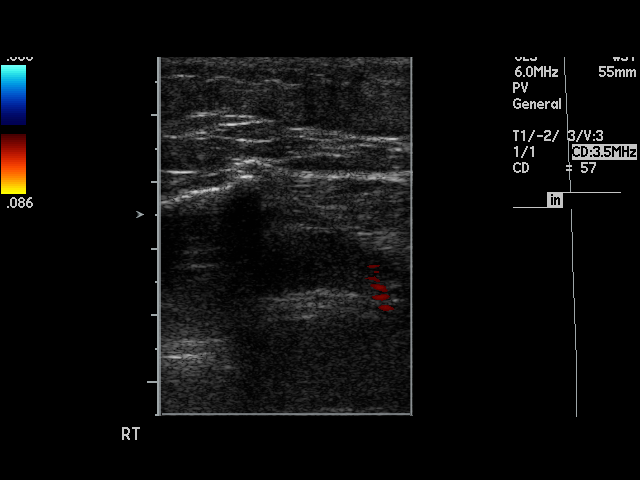
[im 29/29]
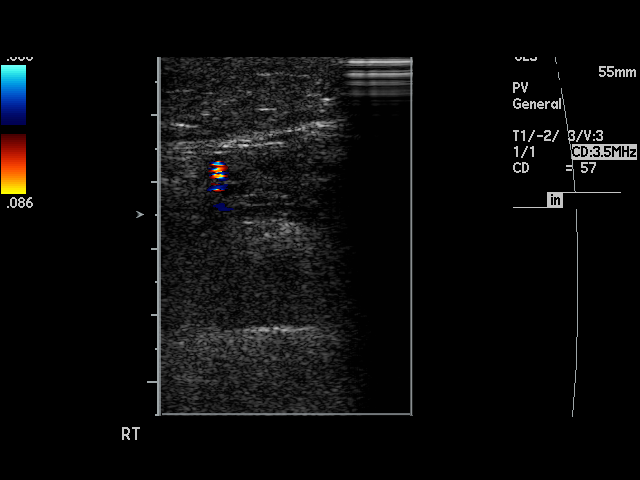

[17 of 24 positions shown; findings below may reference images not displayed]

IMPRESSION: Normal study.  No deep venous thrombosis is identified in the RIGHT lower
extremity.

## 2008-04-21 ENCOUNTER — Ambulatory Visit (HOSPITAL_COMMUNITY): Admission: RE | Admit: 2008-04-21 | Discharge: 2008-04-21 | Payer: Self-pay | Admitting: Neurosurgery

## 2008-06-04 ENCOUNTER — Inpatient Hospital Stay (HOSPITAL_COMMUNITY): Admission: RE | Admit: 2008-06-04 | Discharge: 2008-06-11 | Payer: Self-pay | Admitting: Neurosurgery

## 2008-06-08 ENCOUNTER — Ambulatory Visit: Payer: Self-pay | Admitting: Physical Medicine & Rehabilitation

## 2008-06-18 ENCOUNTER — Observation Stay: Payer: Self-pay | Admitting: Specialist

## 2008-07-17 DIAGNOSIS — G459 Transient cerebral ischemic attack, unspecified: Secondary | ICD-10-CM

## 2008-07-17 HISTORY — PX: COLONOSCOPY: SHX174

## 2008-07-17 HISTORY — DX: Transient cerebral ischemic attack, unspecified: G45.9

## 2008-12-16 ENCOUNTER — Encounter: Admission: RE | Admit: 2008-12-16 | Discharge: 2008-12-16 | Payer: Self-pay | Admitting: Family Medicine

## 2008-12-24 IMAGING — CR DG HAND COMPLETE 3+V*L*
1 series · 4 of 4 positions shown · non-contrast
Comparison: none

REASON FOR EXAM: trauma, fall
COMMENTS:

[Series 2: view not recorded · 0.17mm/px · 4 of 4 slices shown]
[im 1/4]
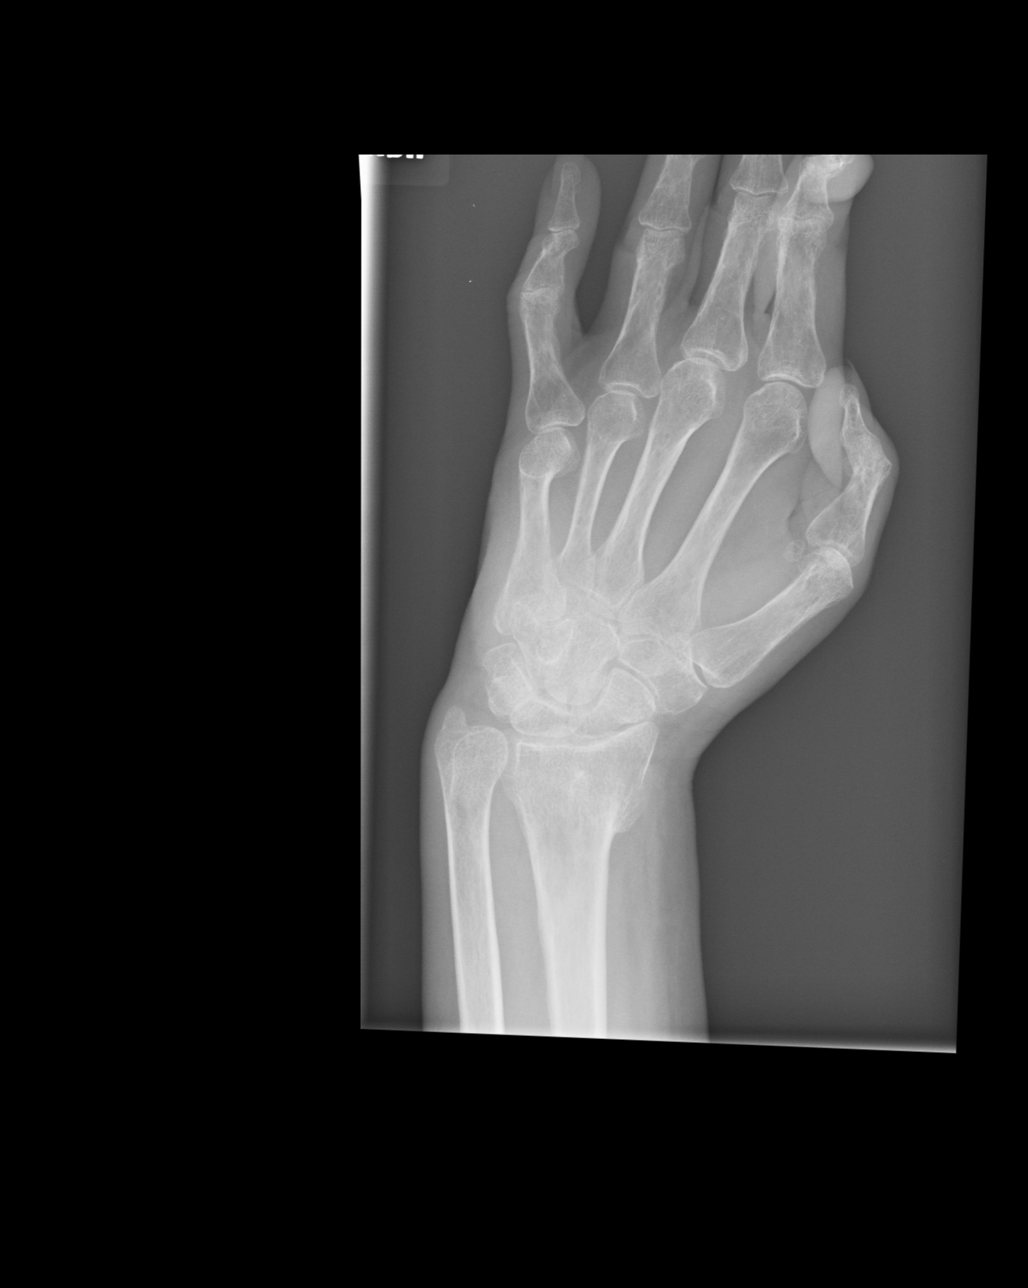
[im 2/4]
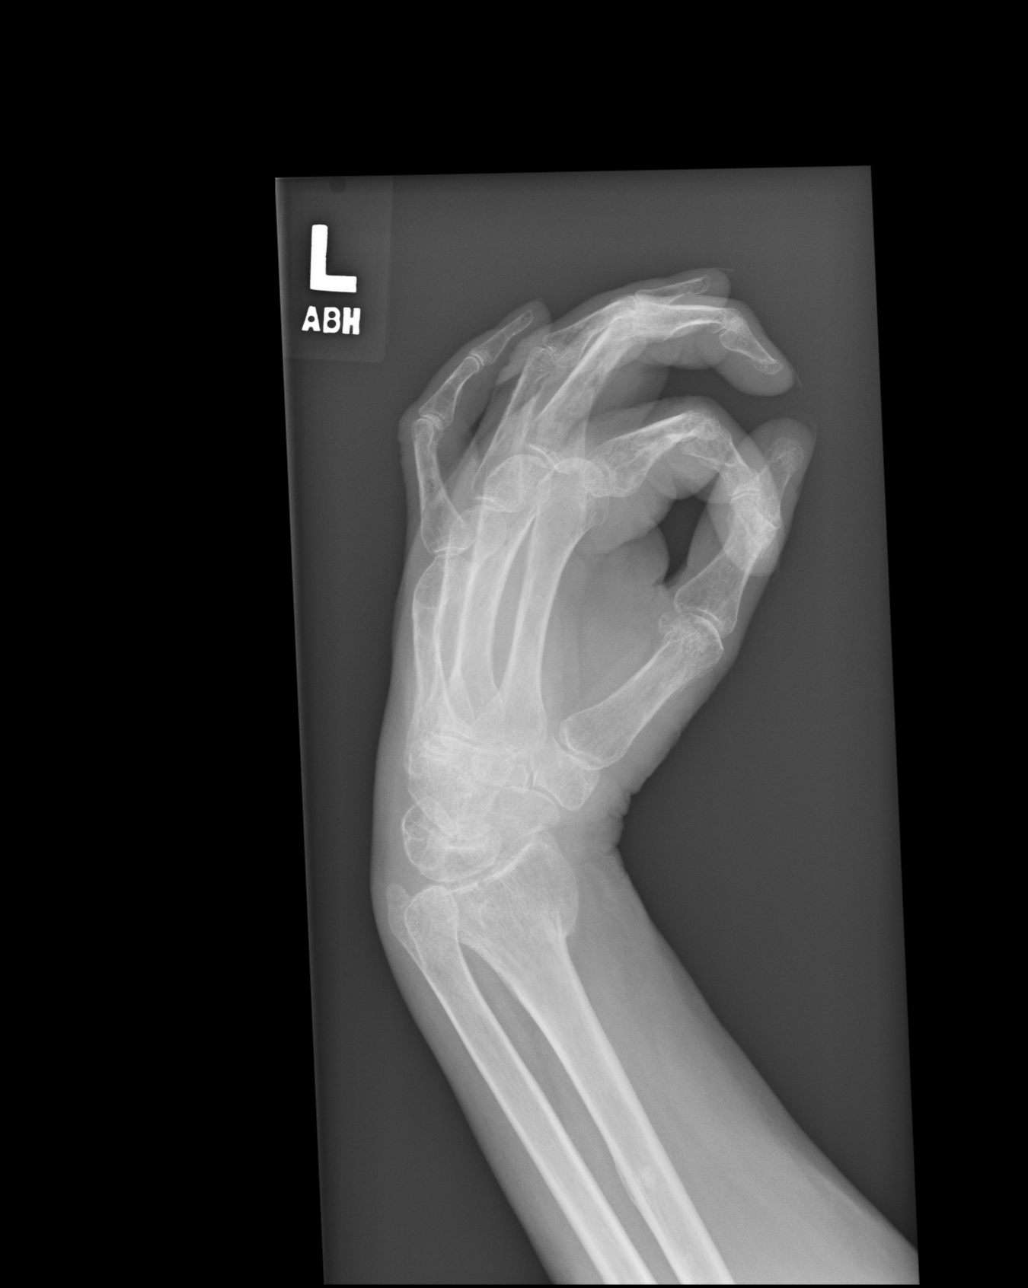
[im 3/4]
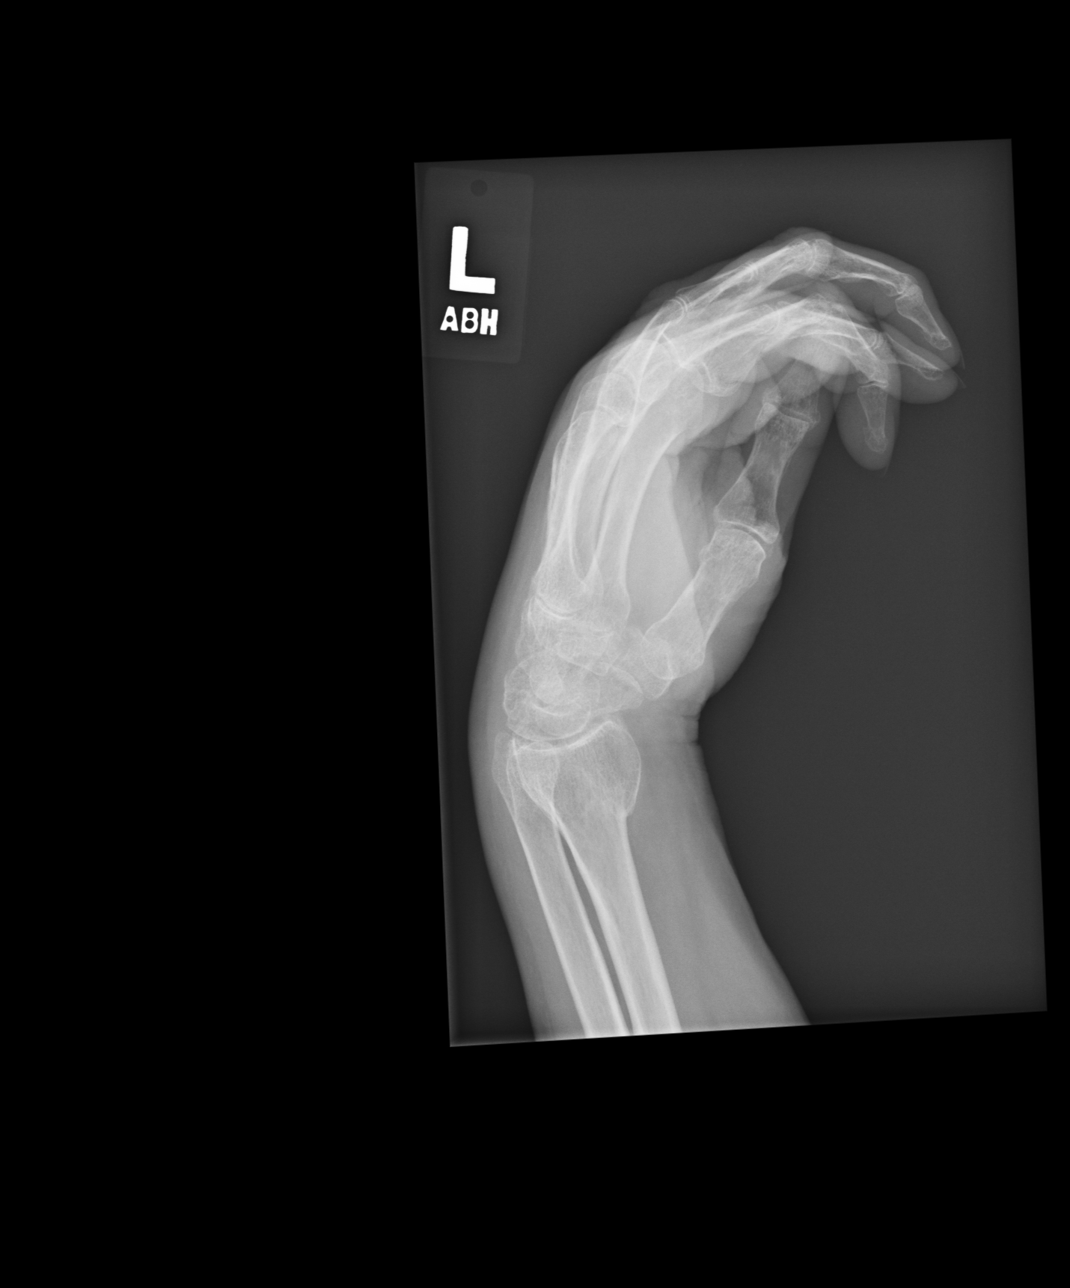
[im 4/4]
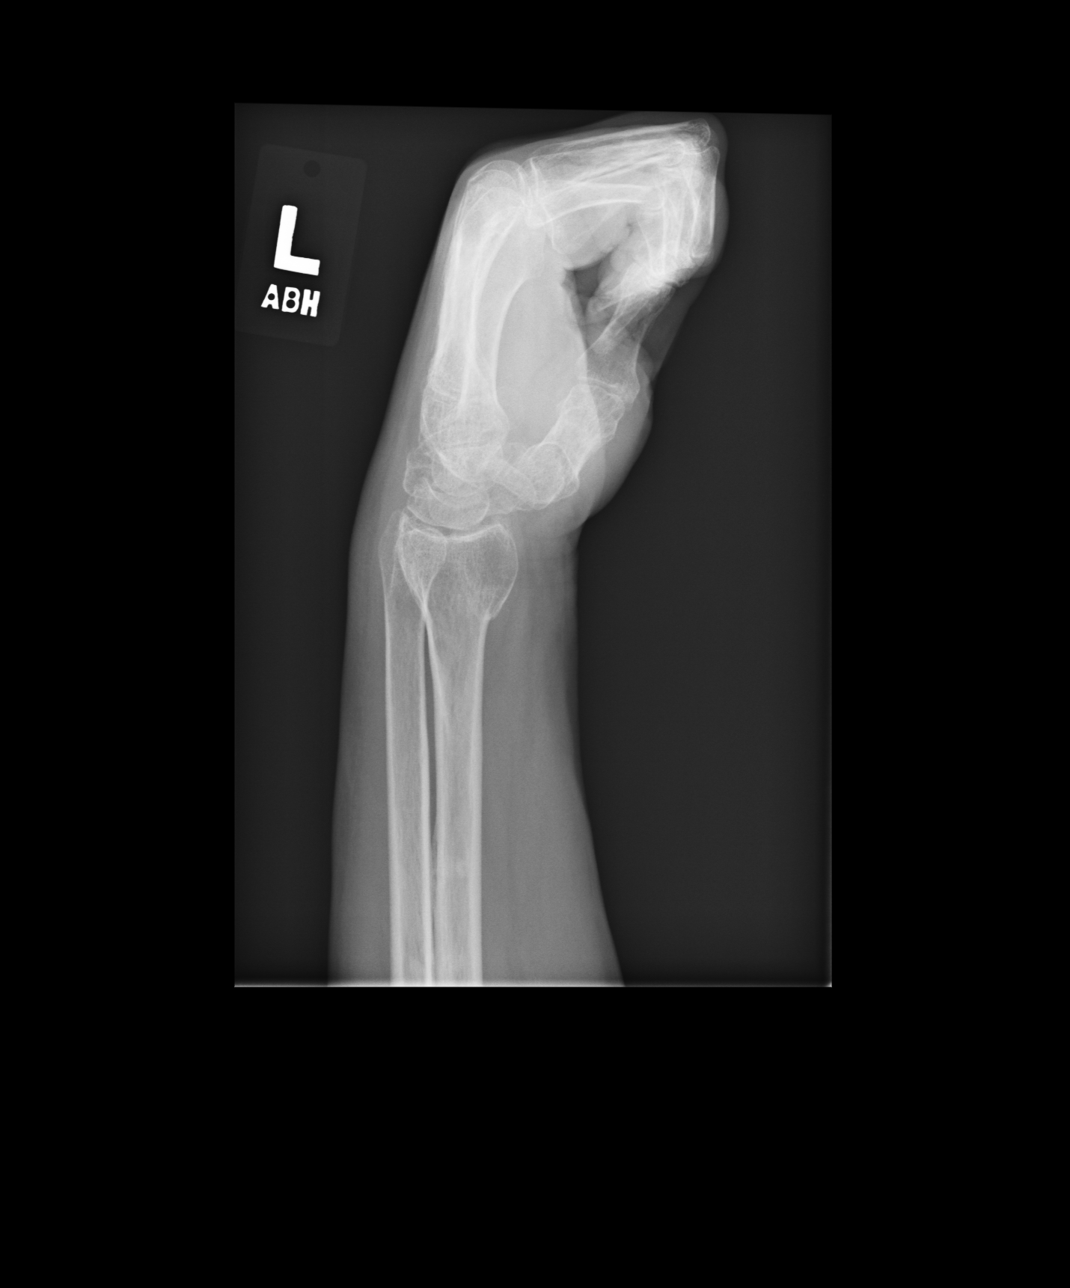

[4 of 4 positions shown; findings below may reference images not displayed]

PROCEDURE:     KDR - KDXR HAND LT COMPLETE W/OBLIQUES  - February 25, 2007 [DATE]

RESULT:     There is a minimally displaced, slightly comminuted fracture of
the distal LEFT radius. The fracture extends into the articular plate but no
step-off along the articular plate is currently seen. No other fractures are
identified.
IMPRESSION: Fracture the distal LEFT radius, minimally displaced.

## 2008-12-24 IMAGING — CR STERNUM - 2+ VIEW
1 series · 4 of 4 positions shown · non-contrast
Comparison: none

REASON FOR EXAM: trauma, fall
COMMENTS:

PROCEDURE:     KDR - KDXR STERNUM  - February 25, 2007 [DATE]
RESULT:     No fracture or other acute bony abnormality is identified.

[Series 1: view not recorded · 0.17mm/px · 4 of 4 slices shown]
[im 1/4]
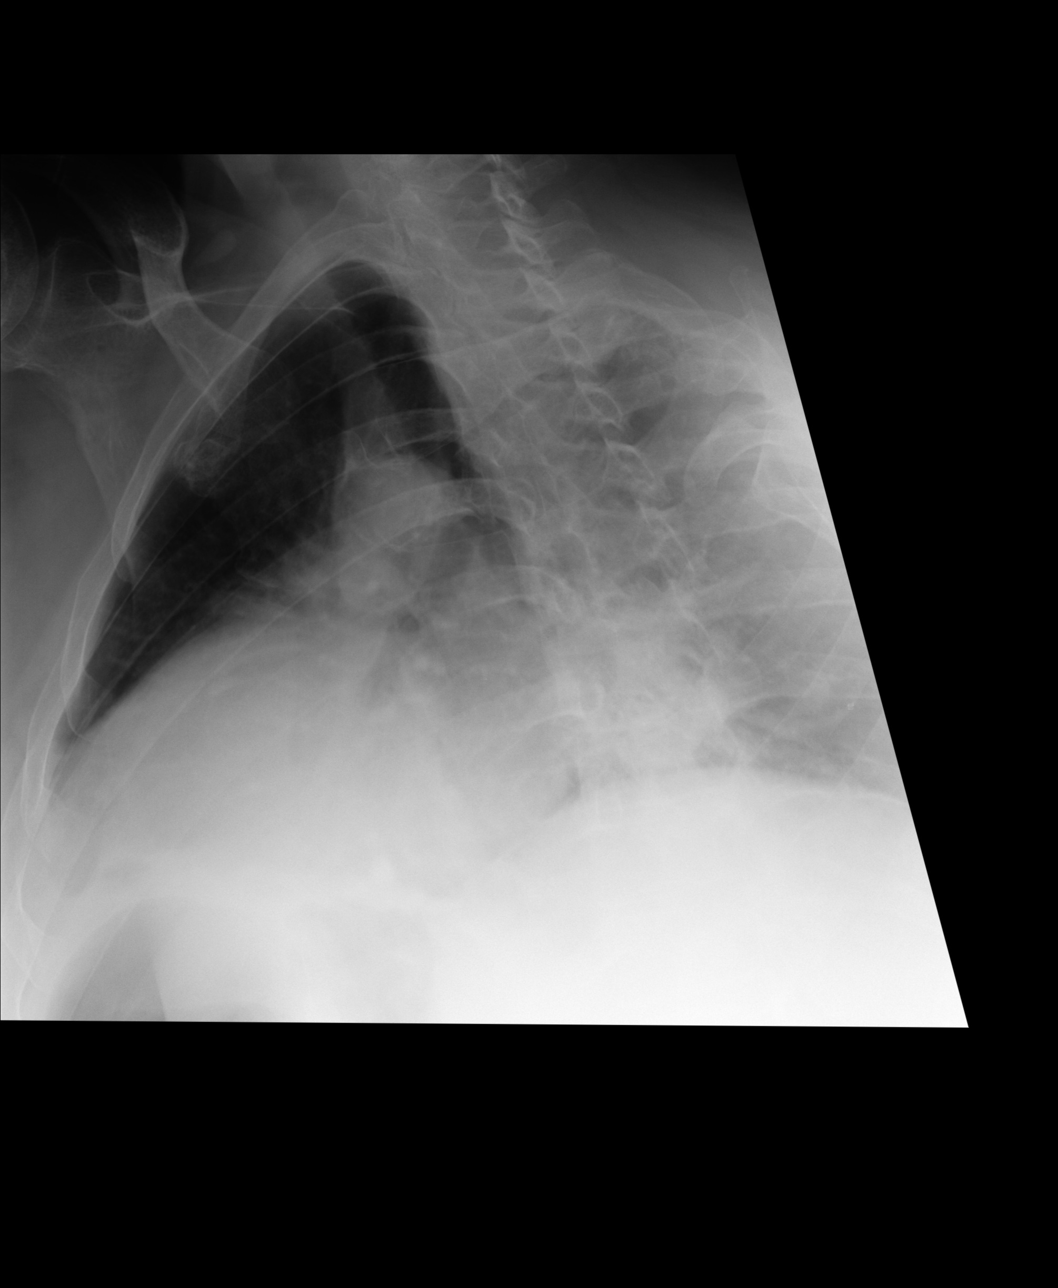
[im 2/4]
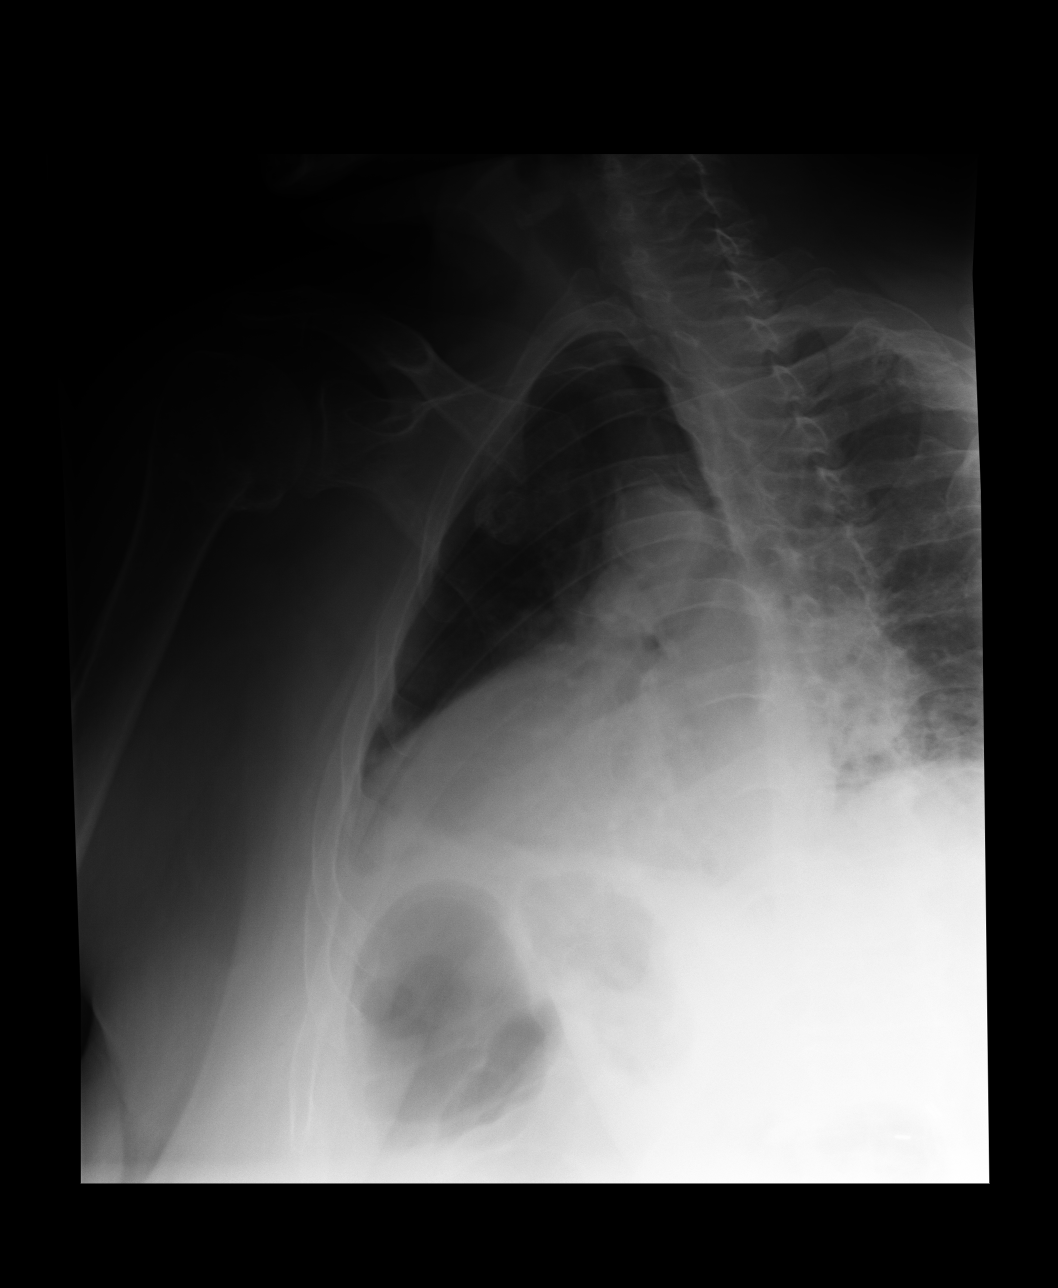
[im 3/4]
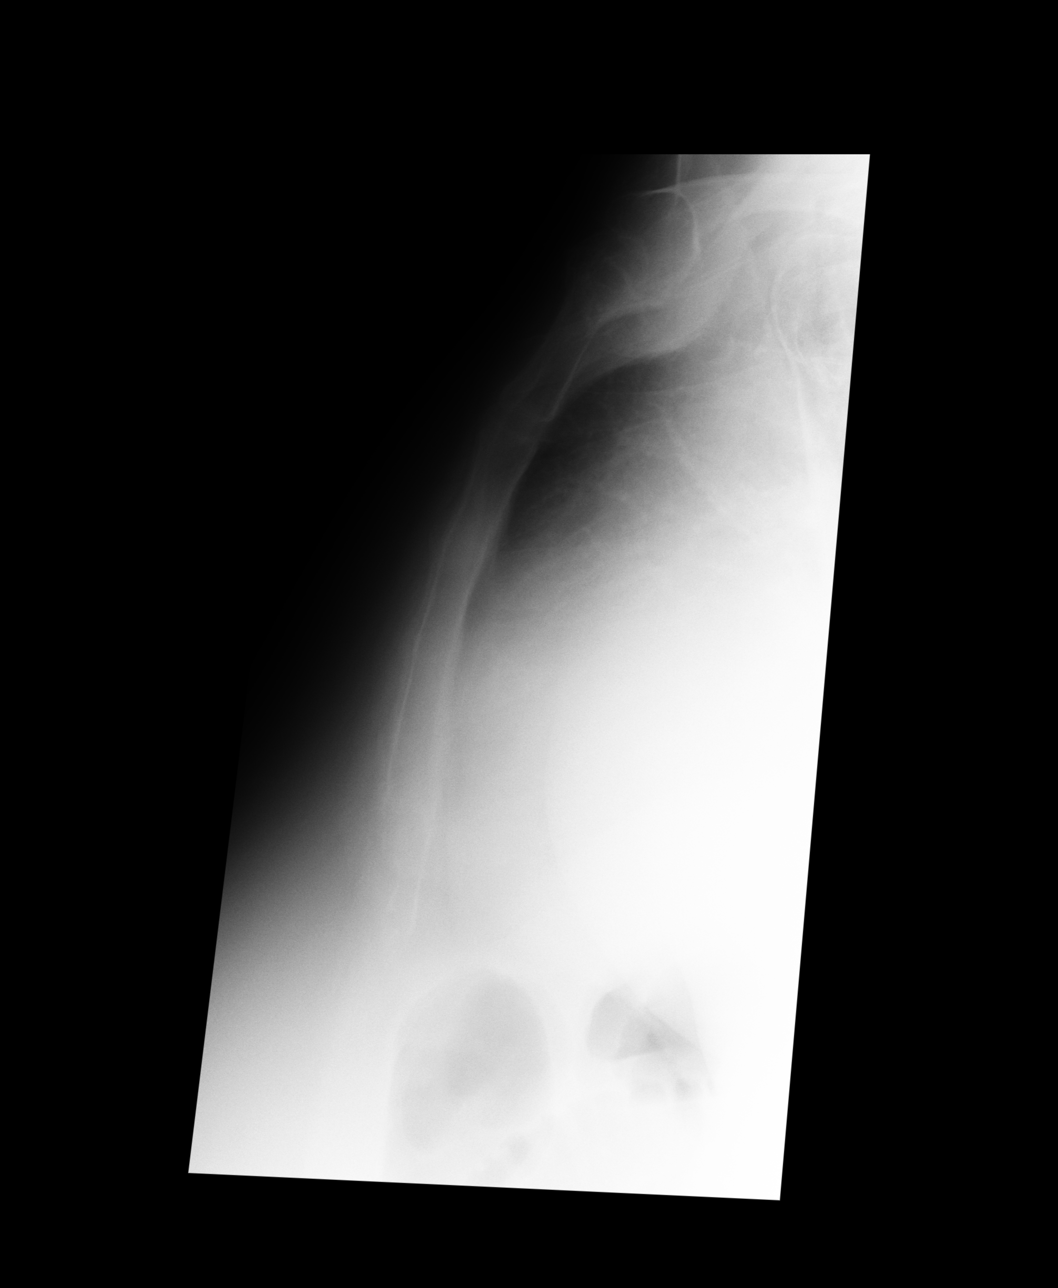
[im 4/4]
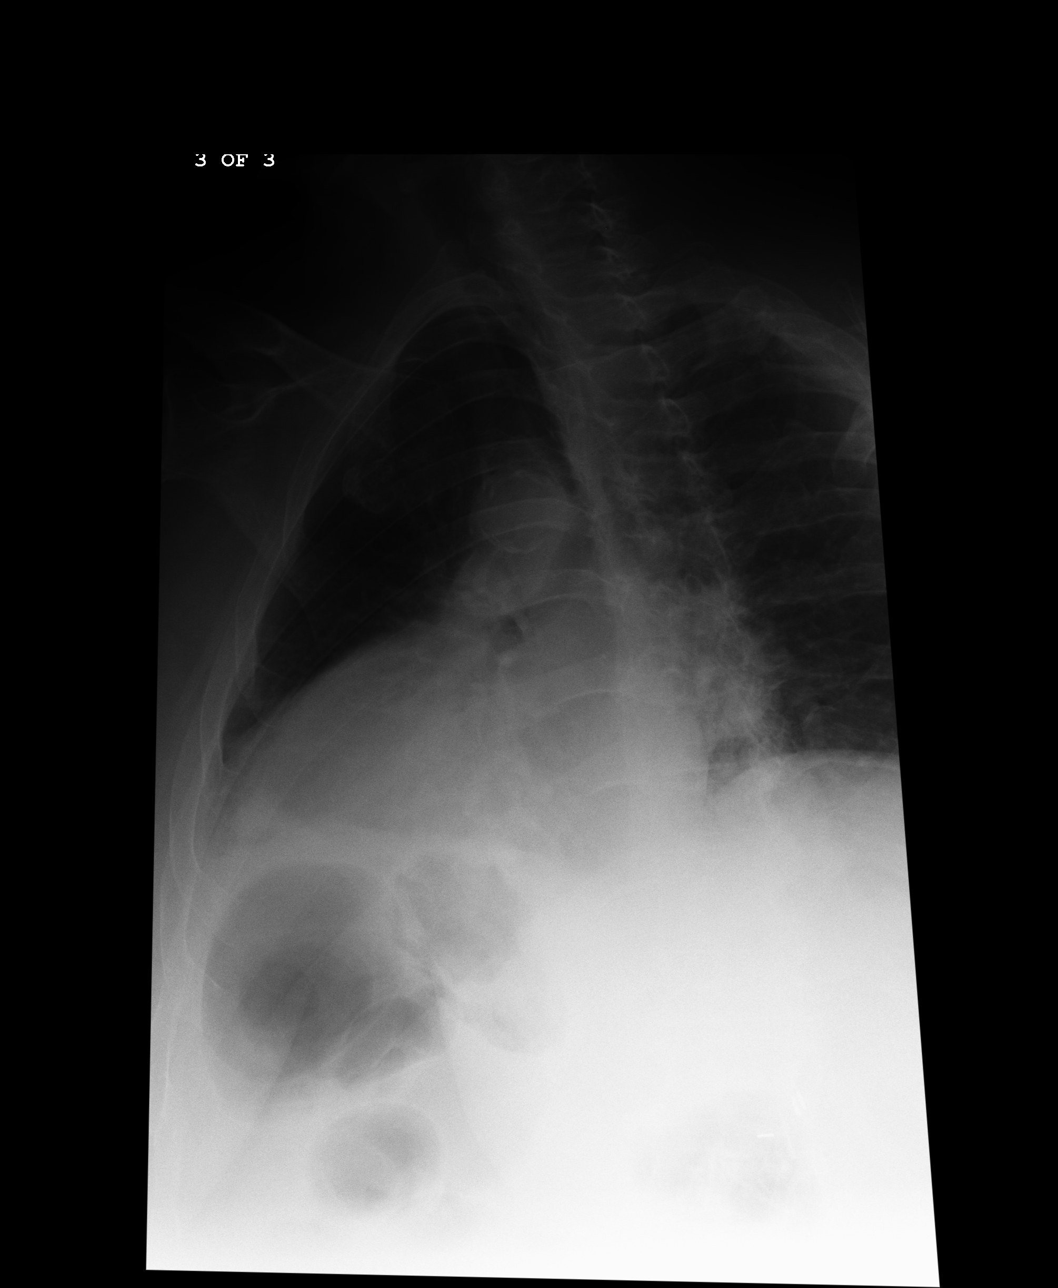

[4 of 4 positions shown; findings below may reference images not displayed]

IMPRESSION: No significant osseous abnormalities are noted.

## 2008-12-24 IMAGING — CR DG CHEST 2V
1 series · 2 of 2 positions shown · non-contrast
Comparison: none

REASON FOR EXAM: trauma, fall
COMMENTS:

PROCEDURE:     KDR - KDXR CHEST PA (OR AP) AND LAT  - February 25, 2007 [DATE]
RESULT:      The lung fields are clear except for slight fibrosis or
atelectasis at the LEFT base. No pneumonia, pneumothorax or pleural effusion
is seen. The heart size is normal. No acute bony abnormalities are seen.

[Series 1: view not recorded · 0.17mm/px · 2 of 2 slices shown]
[im 1/2]
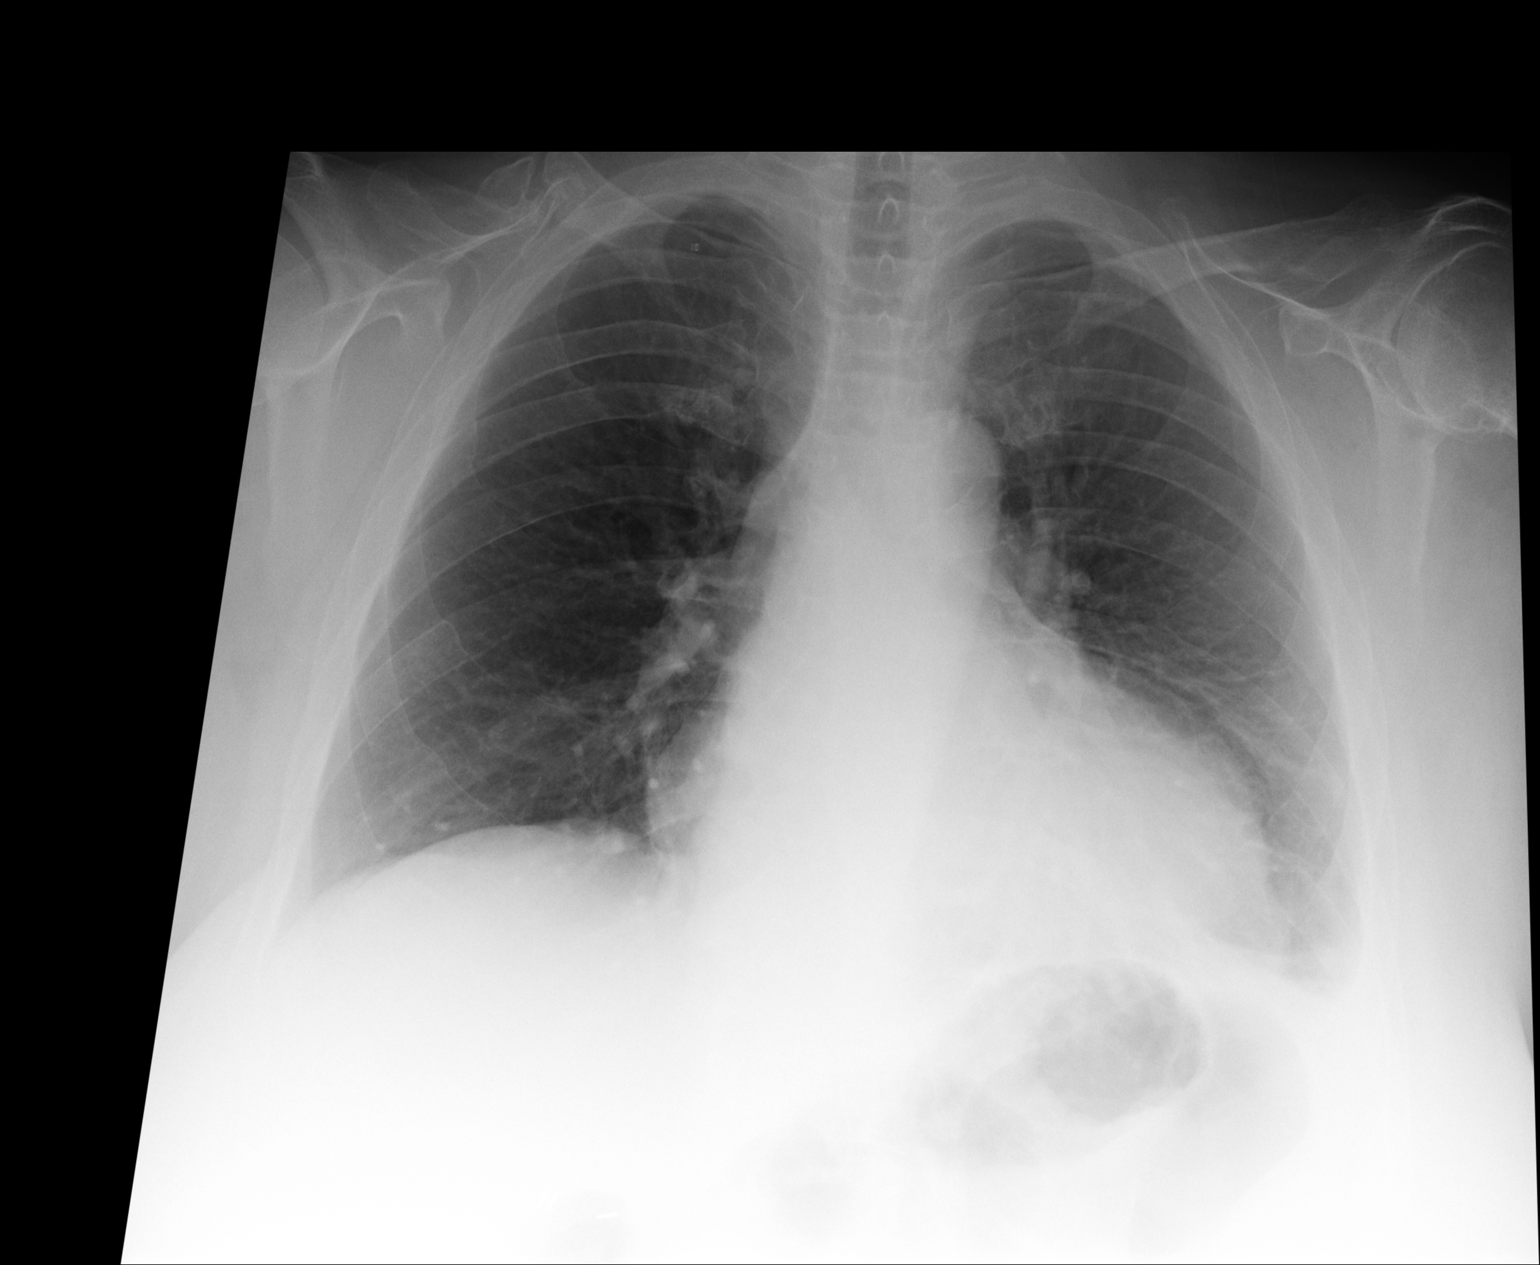
[im 2/2]
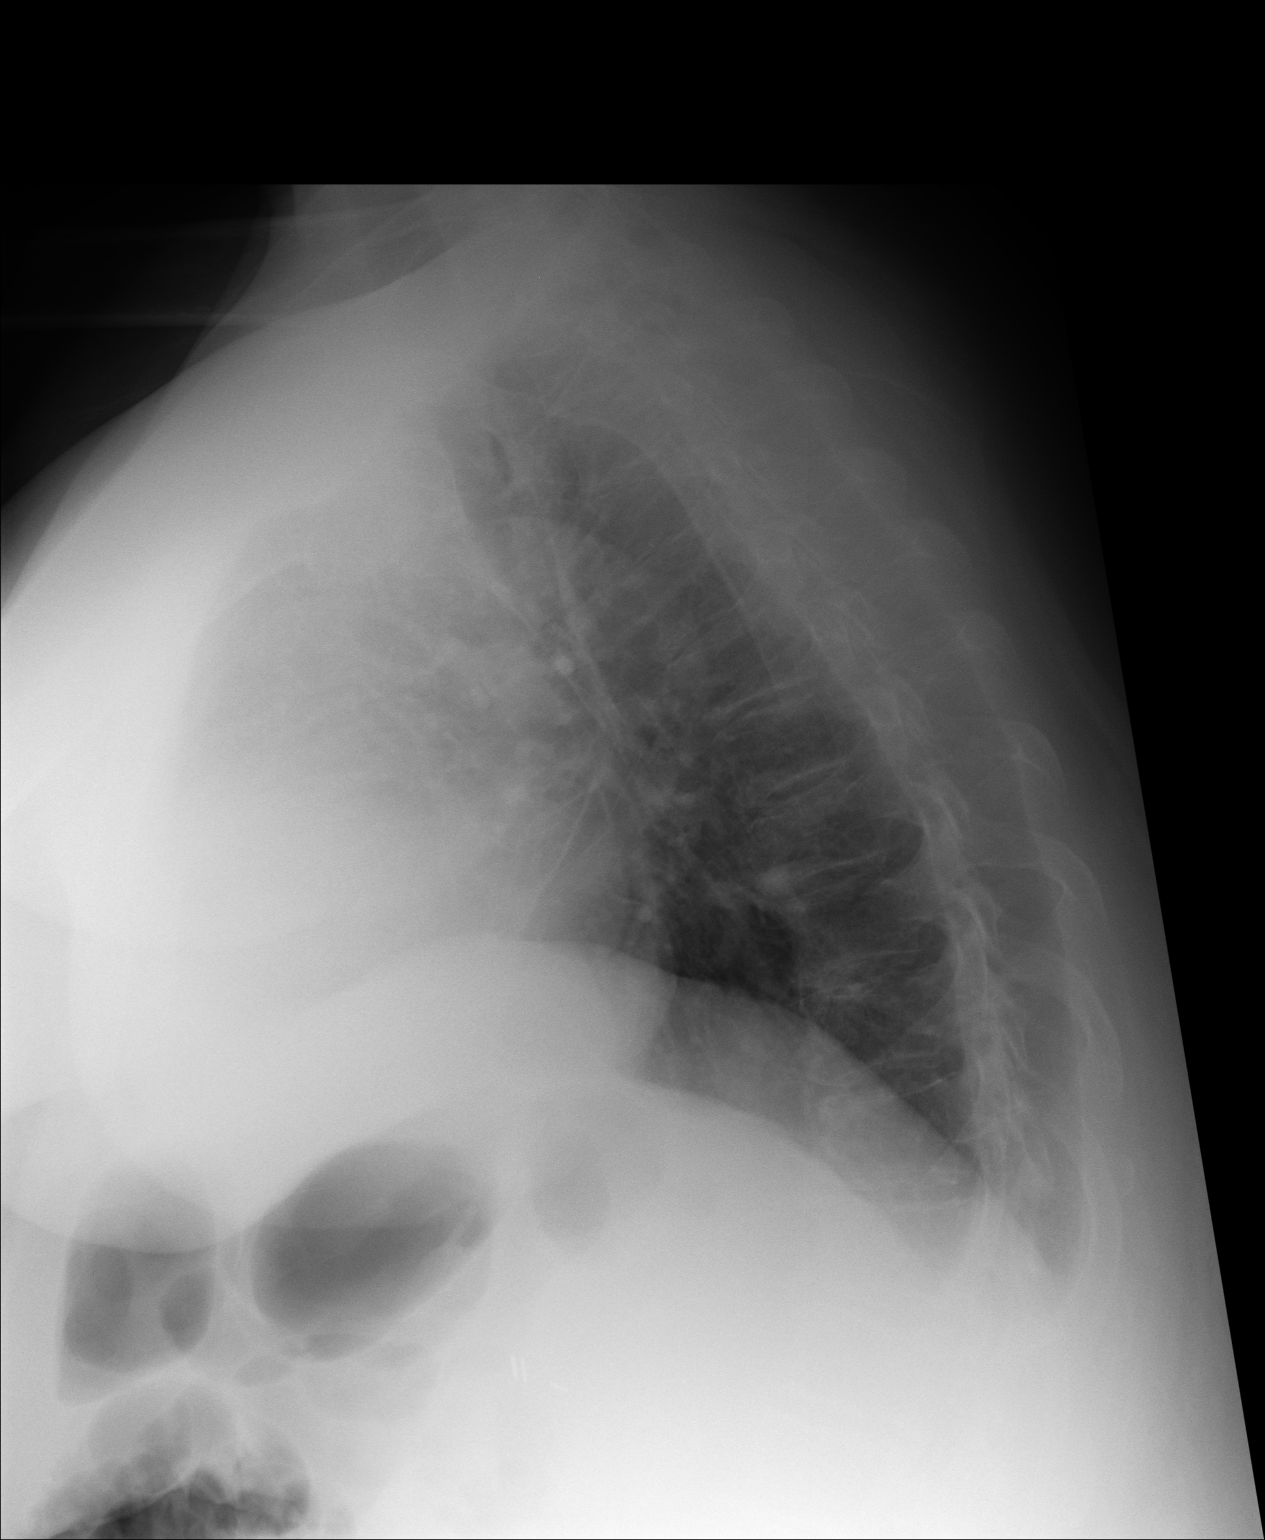

[2 of 2 positions shown; findings below may reference images not displayed]

IMPRESSION: 1. There is a slight increase in density at the LEFT base compatible
fibrosis or atelectasis. The lung fields otherwise are clear.
2. No acute bony abnormalities are identified.

## 2009-01-14 ENCOUNTER — Emergency Department (HOSPITAL_COMMUNITY): Admission: EM | Admit: 2009-01-14 | Discharge: 2009-01-14 | Payer: Self-pay | Admitting: Family Medicine

## 2009-01-15 ENCOUNTER — Ambulatory Visit: Payer: Self-pay | Admitting: Cardiology

## 2009-01-15 ENCOUNTER — Ambulatory Visit: Payer: Self-pay | Admitting: Vascular Surgery

## 2009-01-15 ENCOUNTER — Encounter (INDEPENDENT_AMBULATORY_CARE_PROVIDER_SITE_OTHER): Payer: Self-pay | Admitting: Internal Medicine

## 2009-01-15 ENCOUNTER — Inpatient Hospital Stay (HOSPITAL_COMMUNITY): Admission: EM | Admit: 2009-01-15 | Discharge: 2009-01-16 | Payer: Self-pay | Admitting: Emergency Medicine

## 2009-01-15 IMAGING — CR DG CHEST 2V
1 series · 2 of 2 positions shown · non-contrast
Comparison: none

REASON FOR EXAM: stroke
COMMENTS:

PROCEDURE:     DXR - DXR CHEST PA (OR AP) AND LATERAL  - March 19, 2007  [DATE]
RESULT:       The lung fields are clear. No pneumonia, pneumothorax or
pleural effusion is seen.  The heart is mildly enlarged.  There is observed
an old fracture deformity of the RIGHT clavicle.

[Series 1: view not recorded · 0.17mm/px · 2 of 2 slices shown]
[im 1/2]
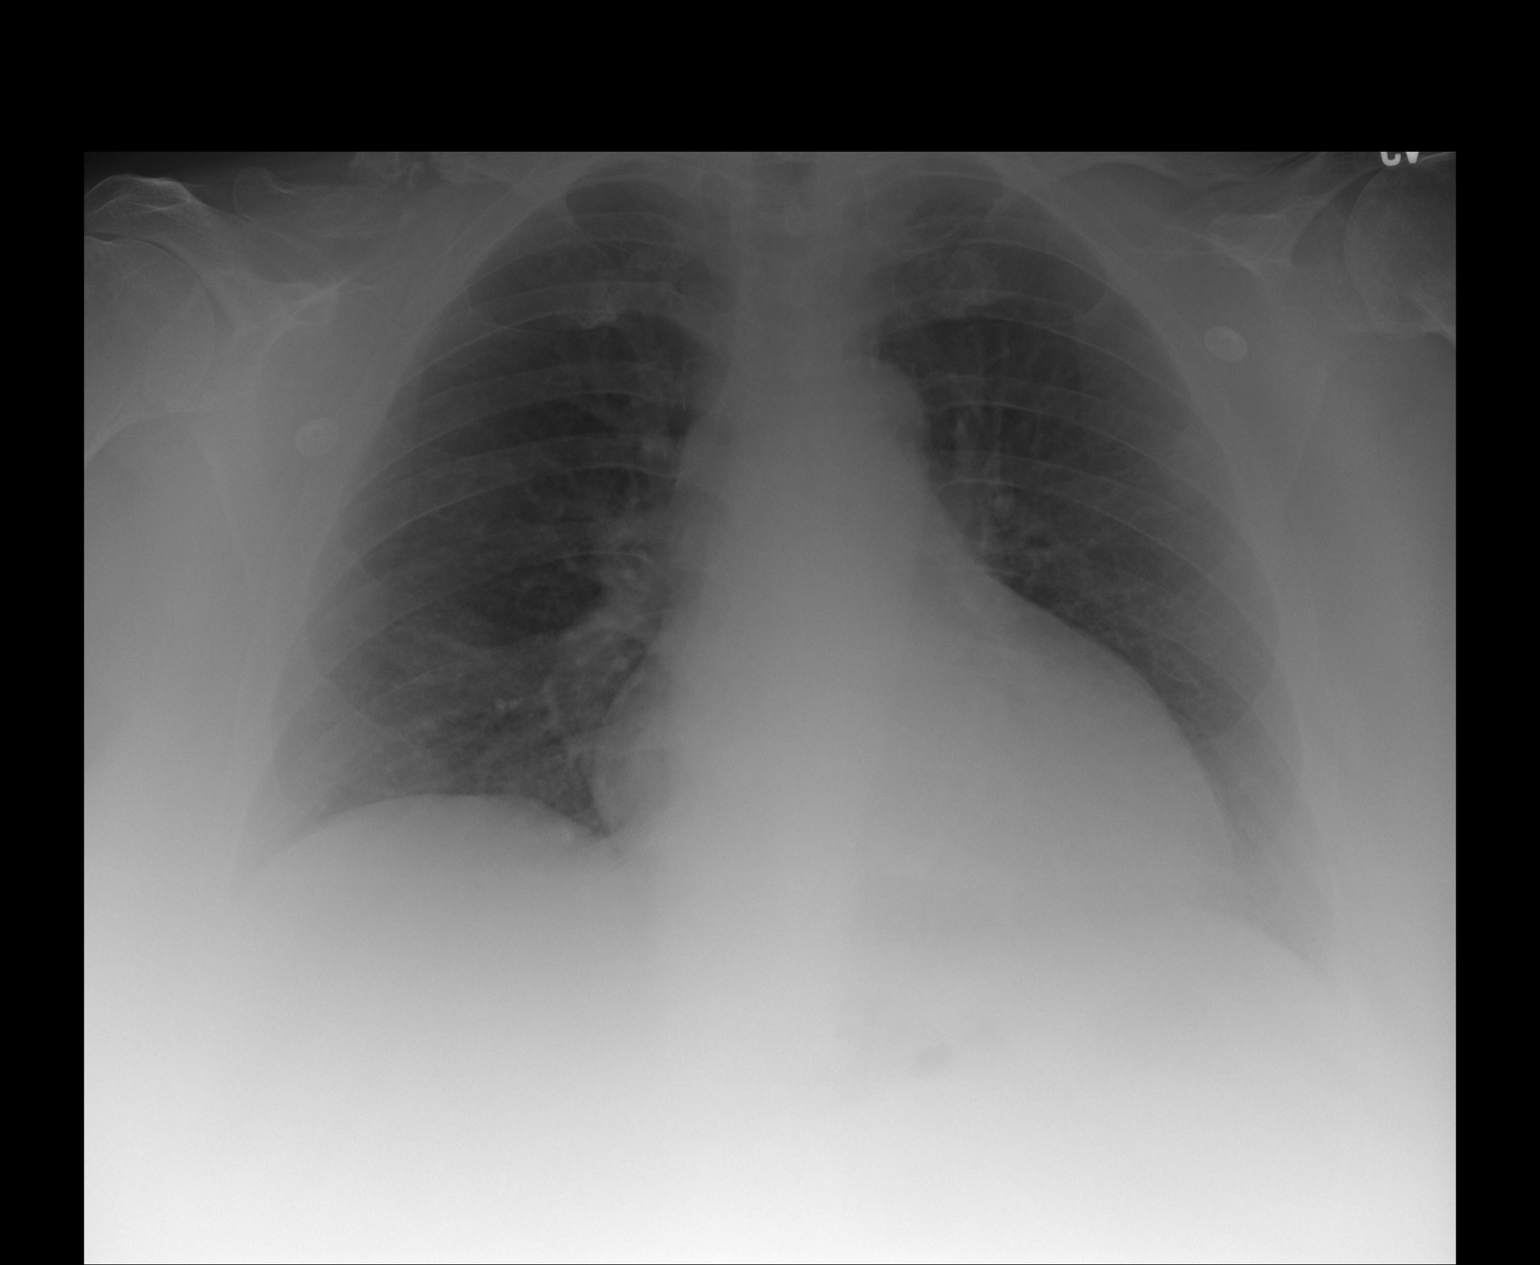
[im 2/2]
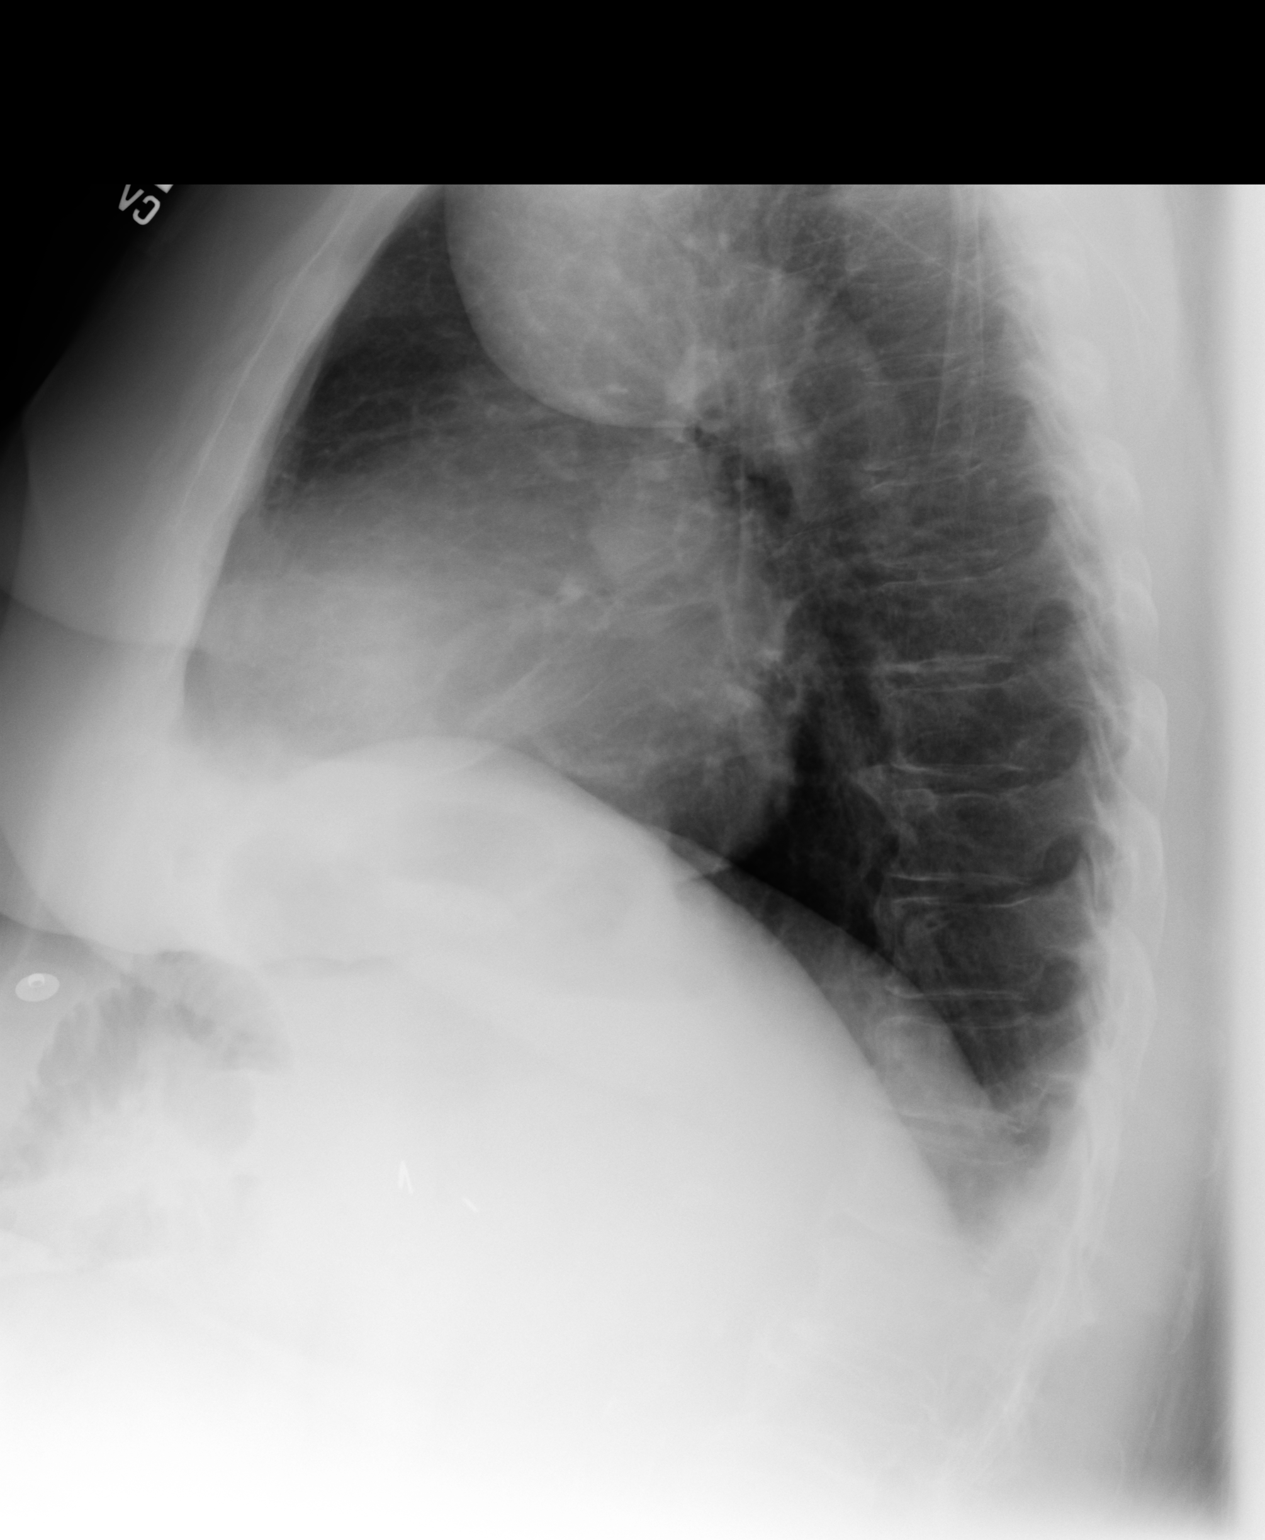

[2 of 2 positions shown; findings below may reference images not displayed]

IMPRESSION: 1.     No acute changes are identified.
2.     Cardiomegaly.
3.     There is an old fracture of the RIGHT clavicle.

## 2009-01-15 IMAGING — CT CT HEAD WITHOUT CONTRAST
1 series · 16 of 30 positions shown, 20 images · non-contrast
Comparison: none

REASON FOR EXAM: confusion  -  ed [HOSPITAL]
COMMENTS:

[Series 2: soft tissue · axial · 0.41mm/px · z∈[-244,-98]mm · 16 of 33 slices shown, 20 images]
[im 2/33  brain]
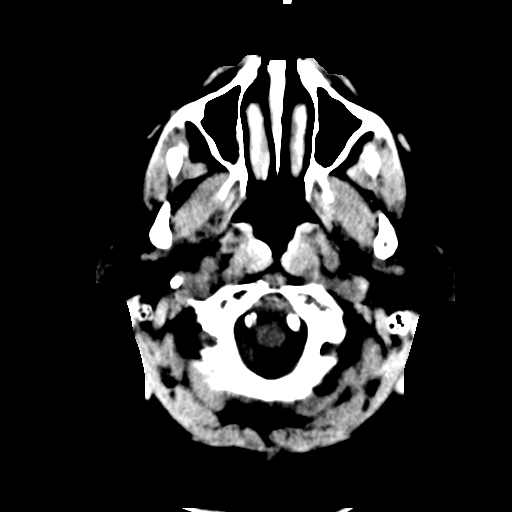
[im 2/33  bone]
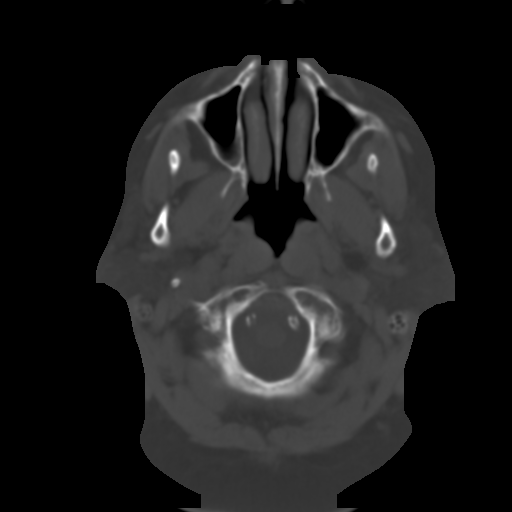
[im 4/33  brain]
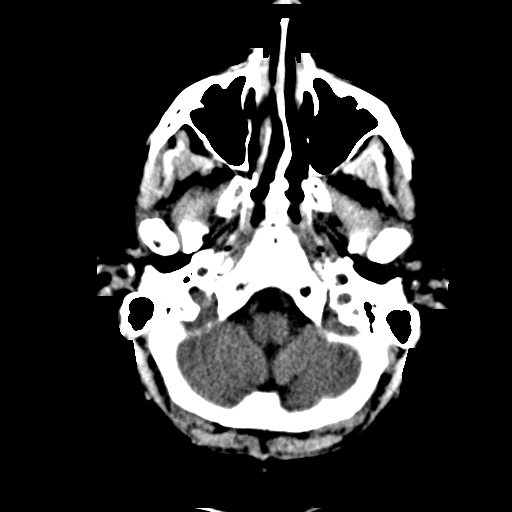
[im 6/33  brain]
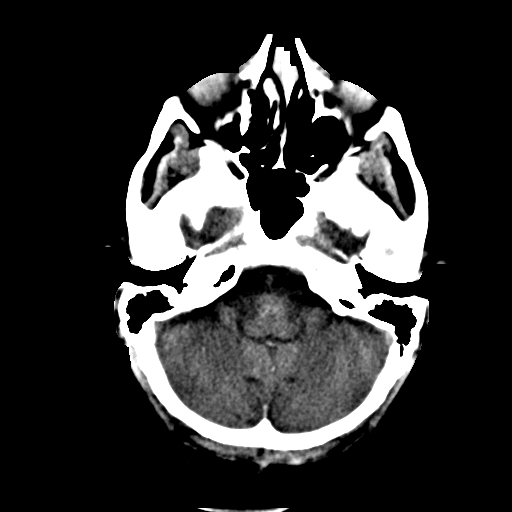
[im 8/33  brain]
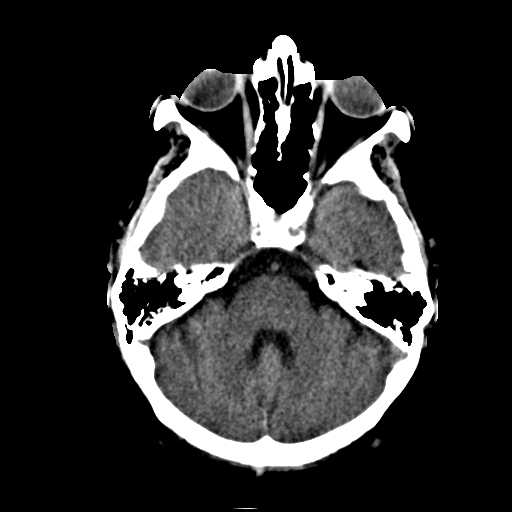
[im 9/33  brain]
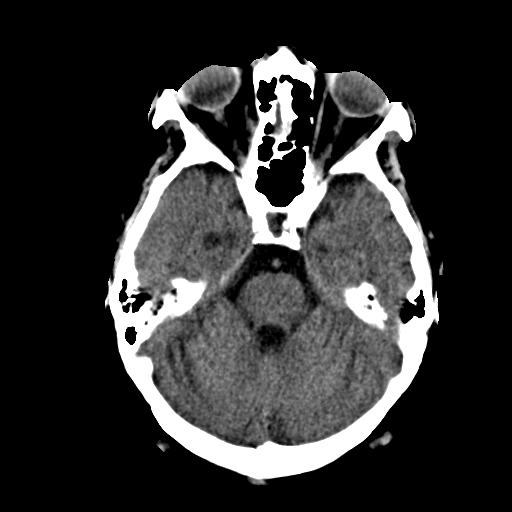
[im 9/33  bone]
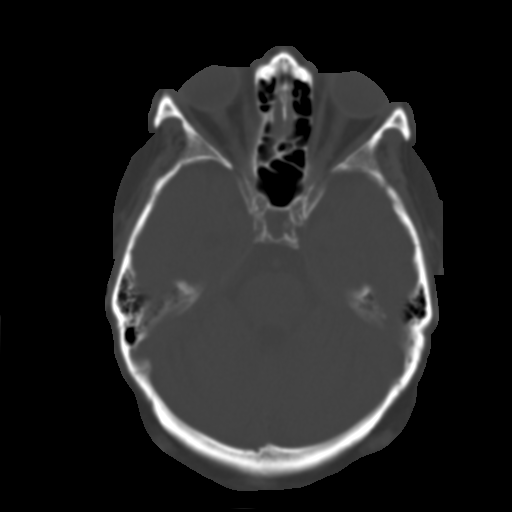
[im 12/33  brain]
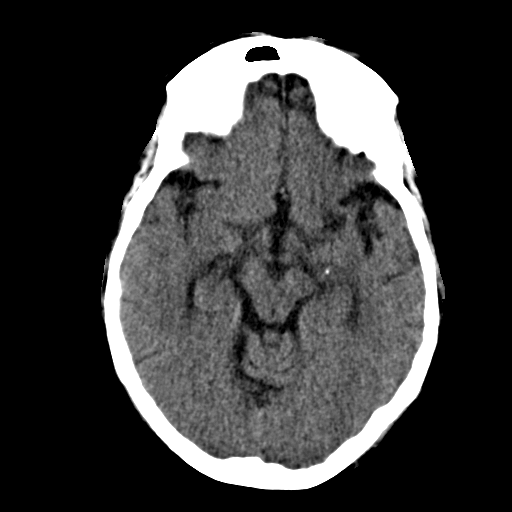
[im 14/33  brain]
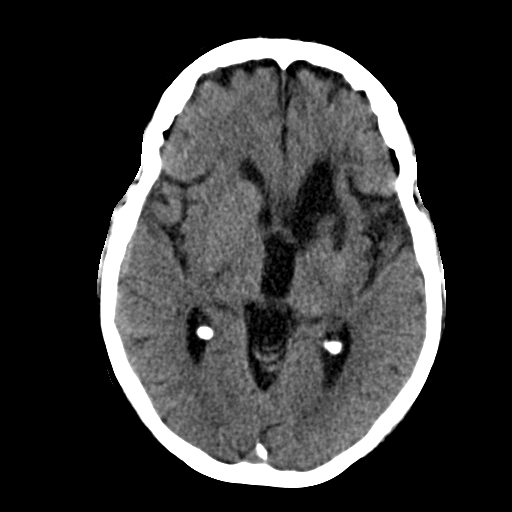
[im 16/33  brain]
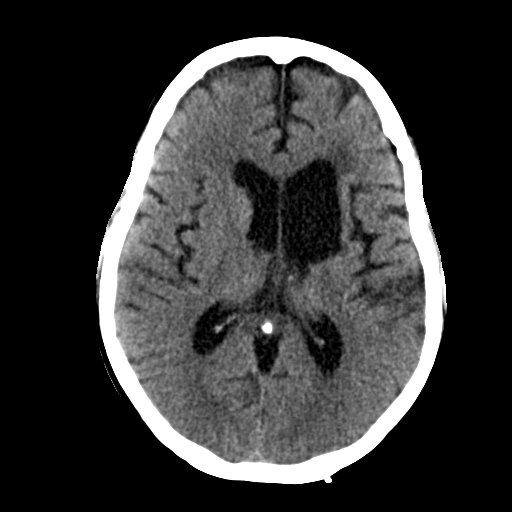
[im 17/33  brain]
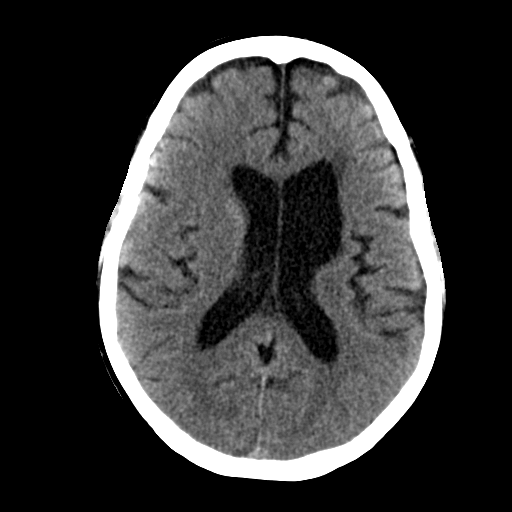
[im 17/33  bone]
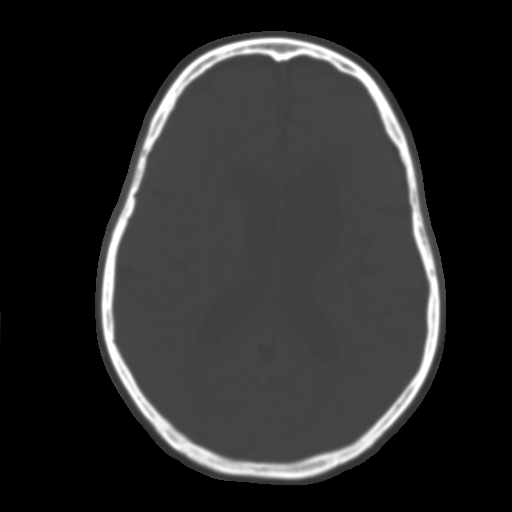
[im 19/33  brain]
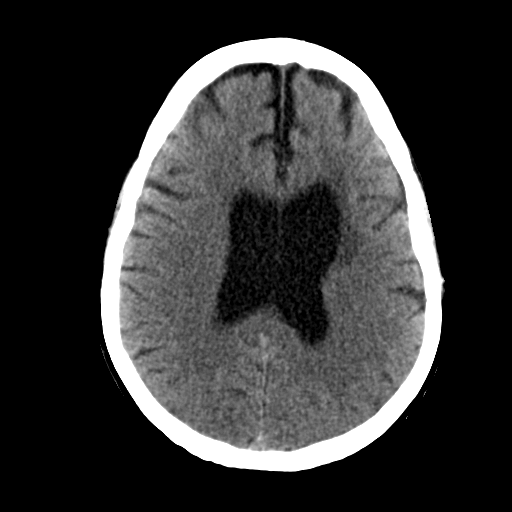
[im 21/33  brain]
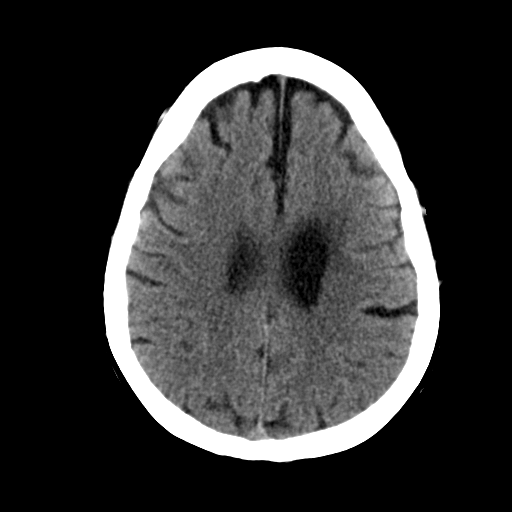
[im 24/33  brain]
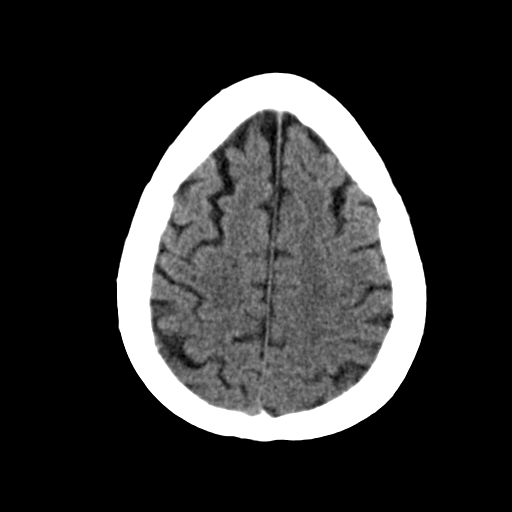
[im 25/33  brain]
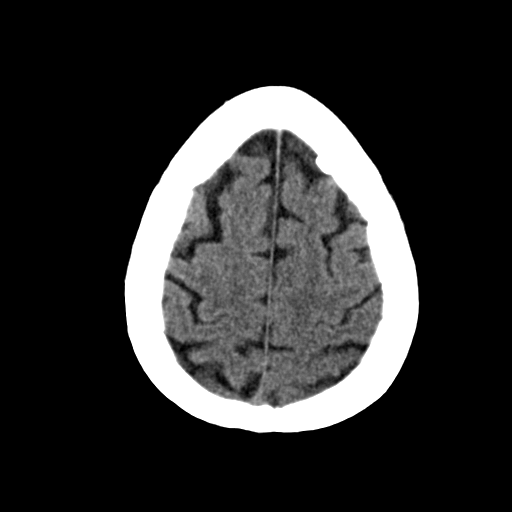
[im 25/33  bone]
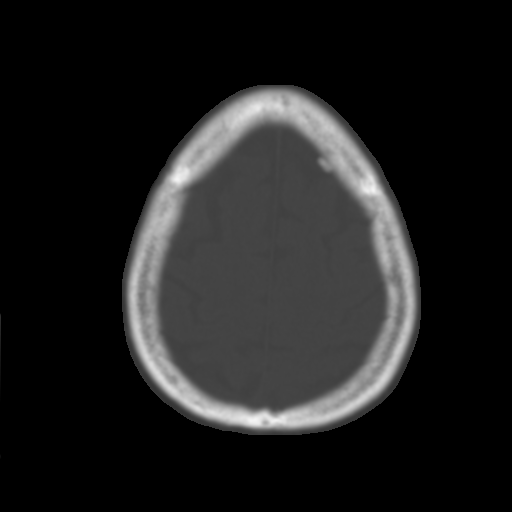
[im 27/33  brain]
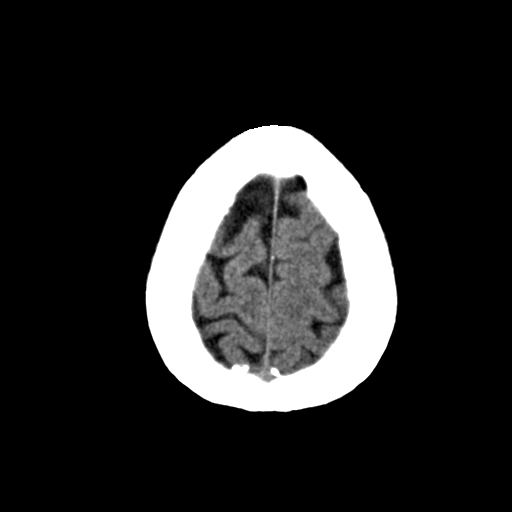
[im 29/33  brain]
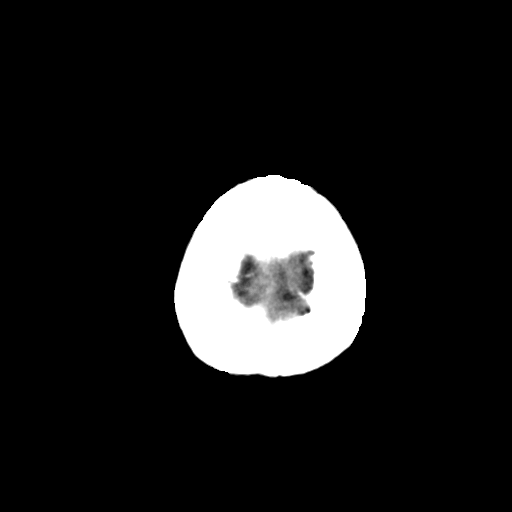
[im 31/33  brain]
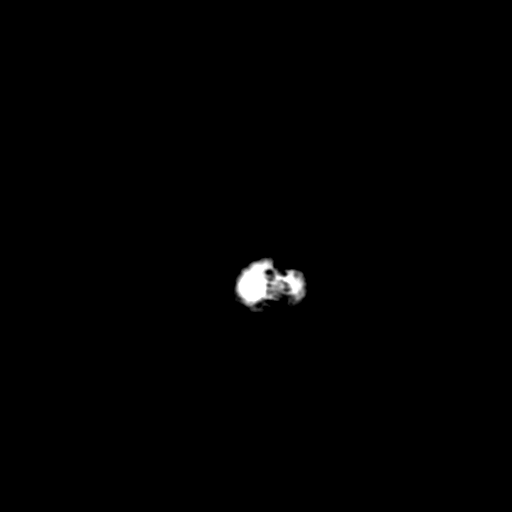

[16 of 30 positions shown; findings below may reference images not displayed]

PROCEDURE:     CT  - CT HEAD WITHOUT CONTRAST  - March 19, 2007  [DATE]

RESULT:     The ventricles are mildly prominent. This is most noticeable on
the LEFT in the frontal region where there is ex vacuo dilation related to
prior encephalomalacia likely from prior ischemic insult in the anterior
parietal lobe and basal ganglia. There is no evidence of acute intracranial
hemorrhage. I do not see findings to suggest an evolving ischemic
infarction. The cerebellum and brainstem are normal in appearance. At bone
window settings the observed portions of the paranasal sinuses are clear.
Review of a study 05 November, 2005 reveals the findings in the brain to be
stable.
IMPRESSION: 1. There are chronic changes in the LEFT basal ganglia with stable
enlargement of the frontal horn of the LEFT lateral ventricle.
2. I do not see evidence of an acute ischemic or hemorrhagic event.

This report was called to the [HOSPITAL] the conclusion of the
study.

## 2009-01-16 IMAGING — US US CAROTID DUPLEX BILAT
1 series · 17 of 24 positions shown · non-contrast
Comparison: none

REASON FOR EXAM: aphasia
COMMENTS:

[Series 1: us carotid duplex bilat · 17 of 61 slices shown]
[im 1/61]
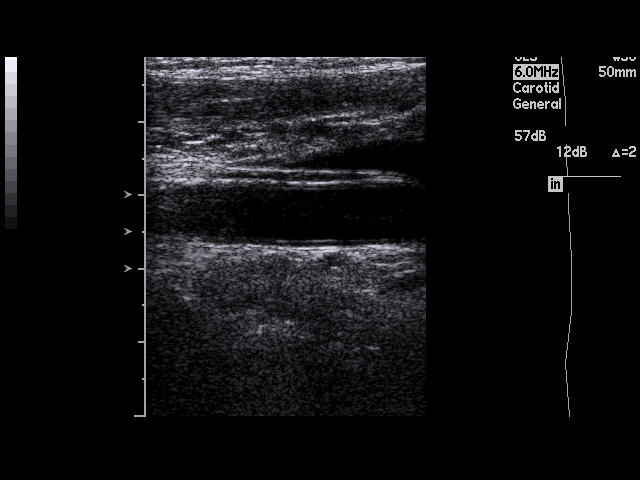
[im 6/61]
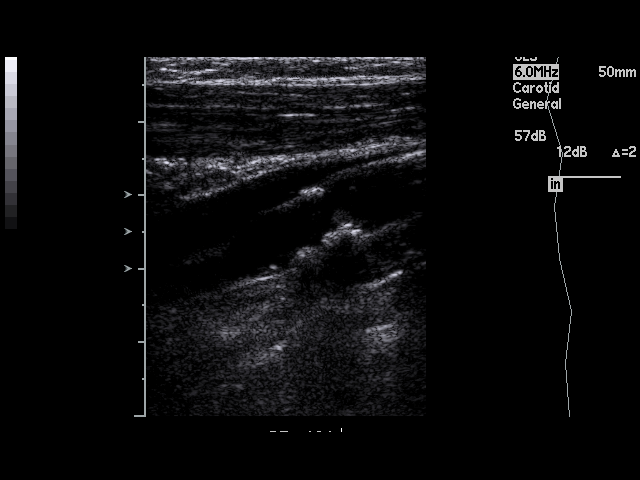
[im 8/61]
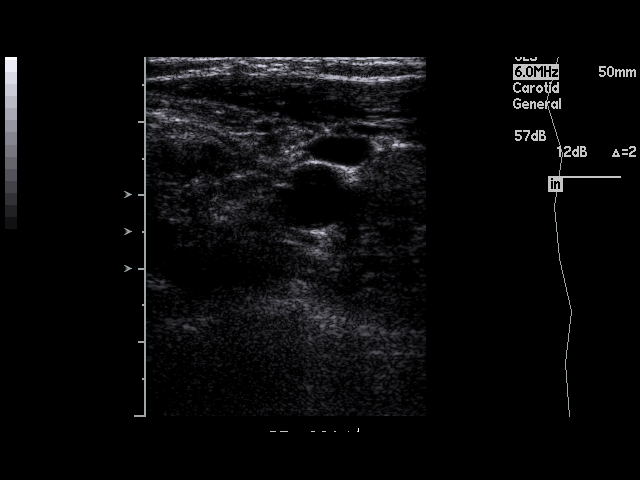
[im 11/61]
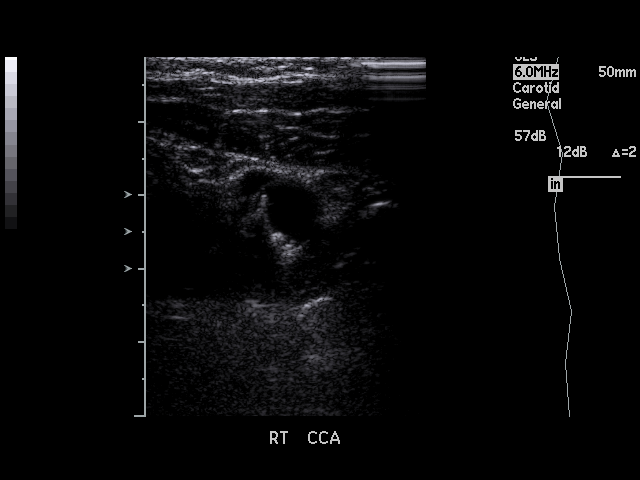
[im 16/61]
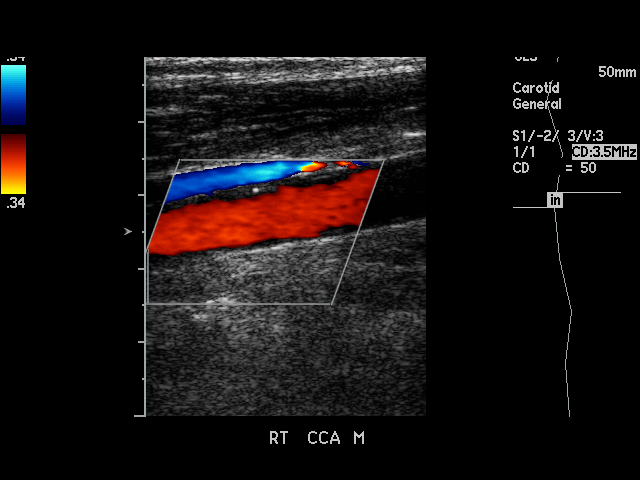
[im 19/61]
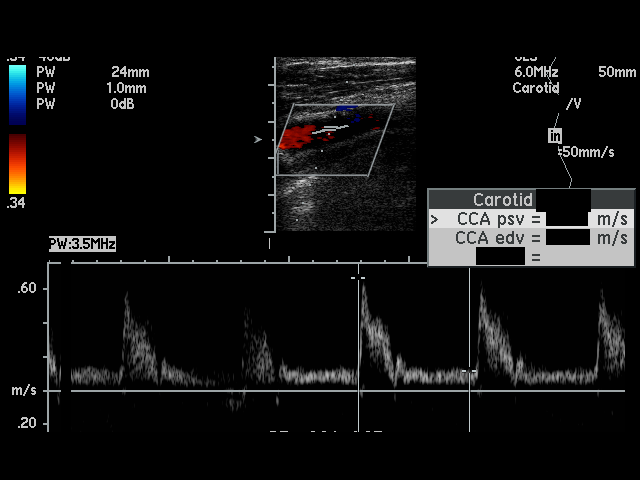
[im 24/61]
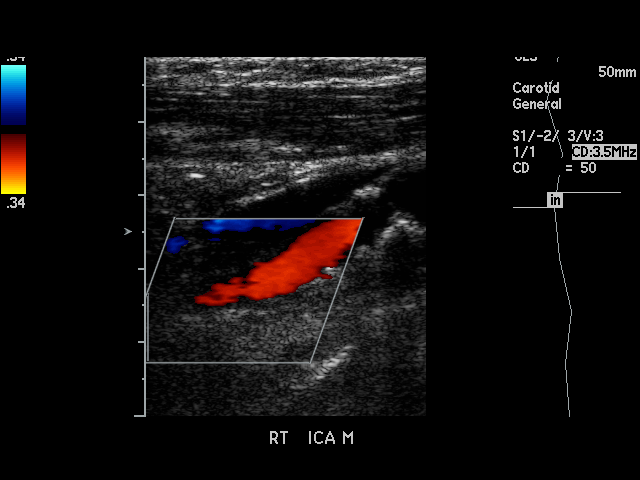
[im 27/61]
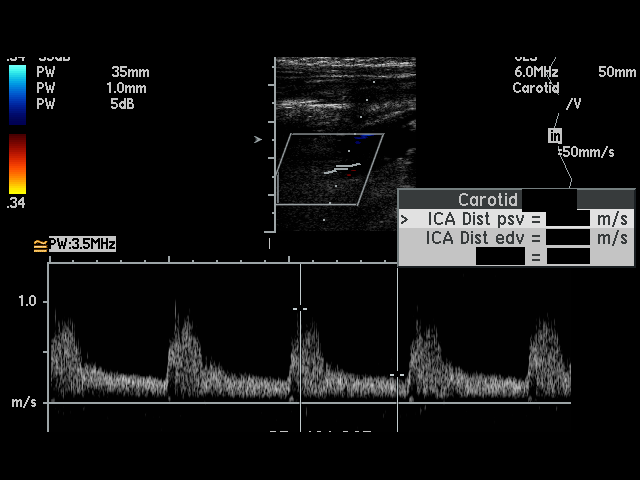
[im 32/61]
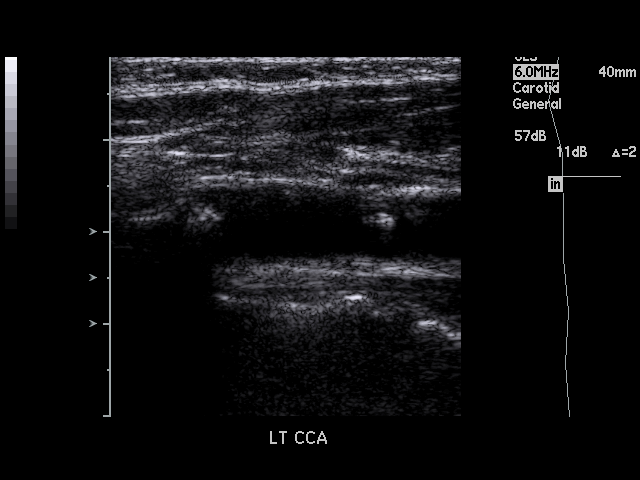
[im 34/61]
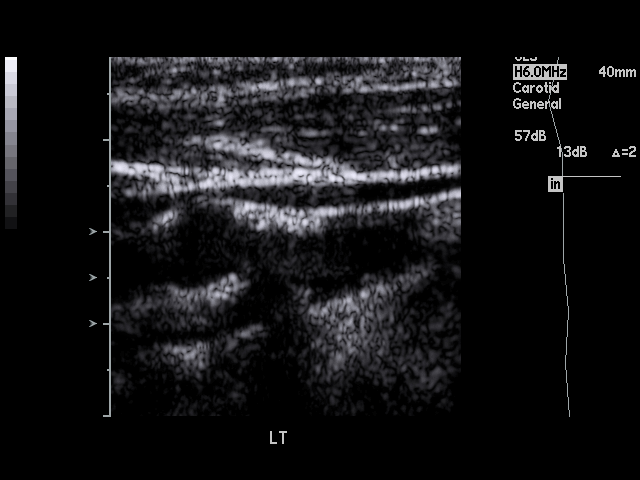
[im 37/61]
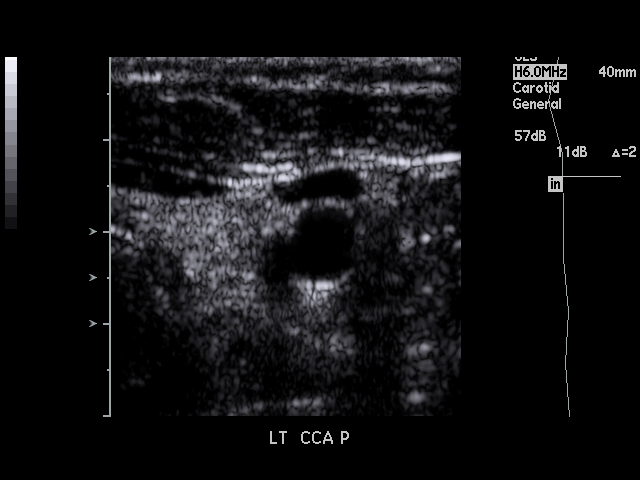
[im 42/61]
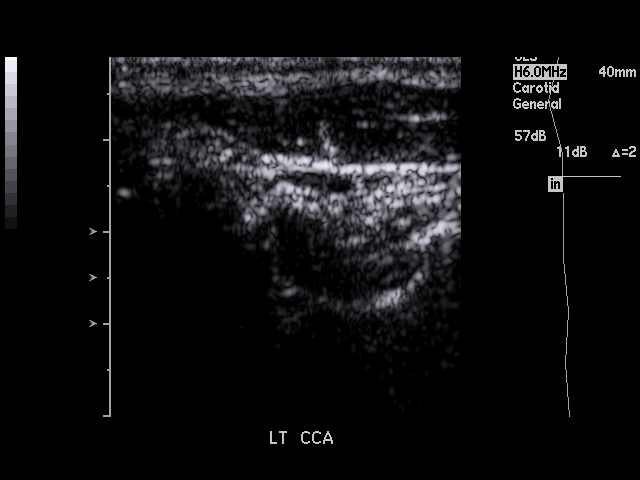
[im 45/61]
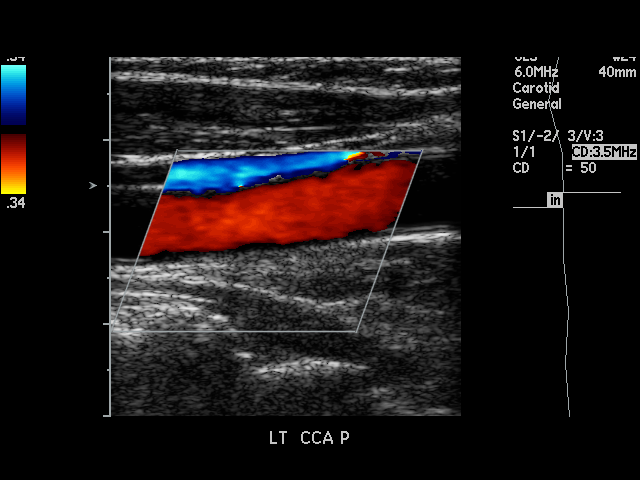
[im 50/61]
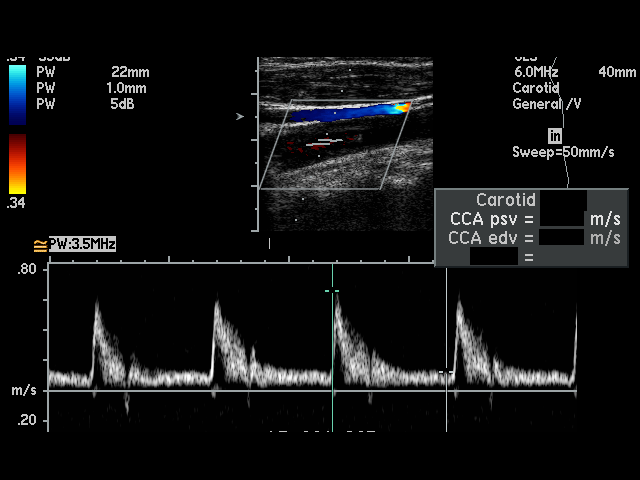
[im 53/61]
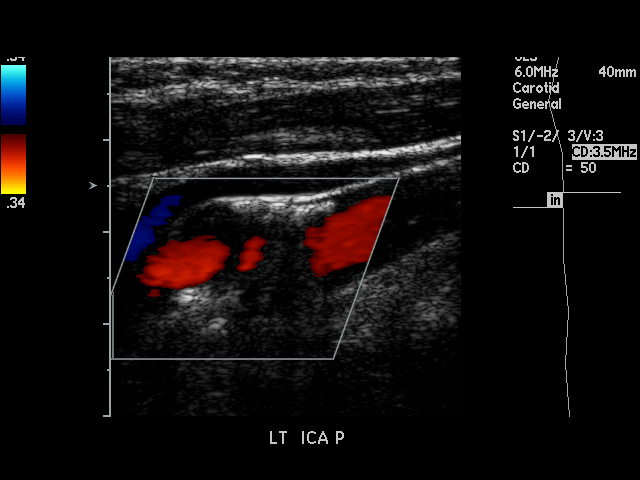
[im 55/61]
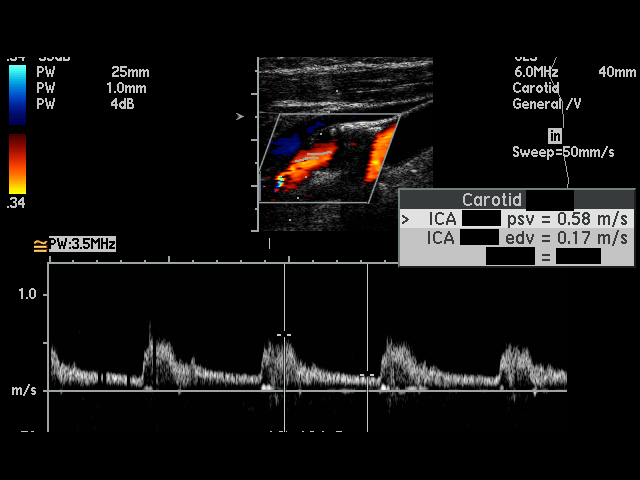
[im 61/61]
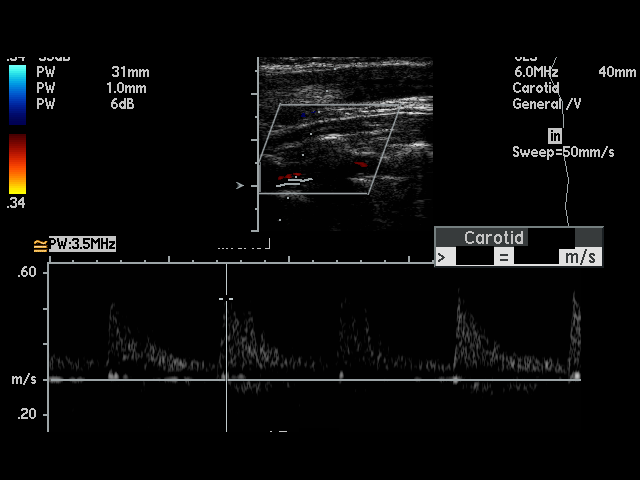

[17 of 24 positions shown; findings below may reference images not displayed]

PROCEDURE:     US  - US CAROTID DOPPLER BILATERAL  - March 20, 2007  [DATE]

RESULT:     There is mild to moderate calcific plaque formation about the
carotid bifurcations bilaterally. On the RIGHT, the peak RIGHT common
carotid artery flow velocity measures .671 meters per second and the peak
RIGHT internal carotid artery flow velocity measures 1.190 meters per
second. The IC/CC ratio is 1.773.  On the LEFT, the peak LEFT common carotid
artery flow velocity measures .684 meters per second and the peak LEFT
internal carotid artery flow velocity measures .750 meters per second. The
IC/CC ratio is 1.096.  These values bilaterally are in the normal range and
compatible with the absence of hemodynamically significant stenosis.

Antegrade flow is observed in both vertebrals.
IMPRESSION: 1. There is mild to moderate calcific plaque formation noted bilaterally. 2.
No hemodynamically significant stenosis is identified on either side.
3. Antegrade flow is present in both vertebrals.

## 2009-01-16 IMAGING — MR MRI HEAD WITHOUT CONTRAST
6 of 7 series · 39 of 48 positions shown · IV contrast (gadolinium)
Comparison: none

REASON FOR EXAM: acute cva
COMMENTS:

PROCEDURE:     MR  - MR BRAIN WO CONTRAST  - March 20, 2007  [DATE]
RESULT:     Comparison: CT of the head on 03/19/2007 and MRI brain on
08/15/05.
TECHNIQUE: Multisequence, multiplanar MRI of the brain was performed without
gadolinium contrast.

[Series 3: t1_se_sag · axial · 10.0mm · 0.55mm/px · z∈[+0,+108]mm · 5 of 24 slices shown]
[im 1/24]
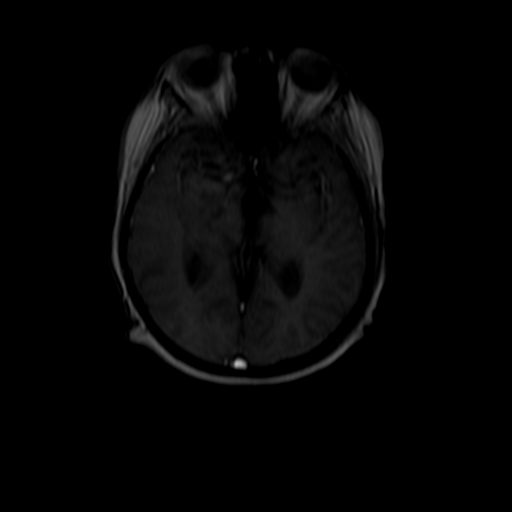
[im 4/24]
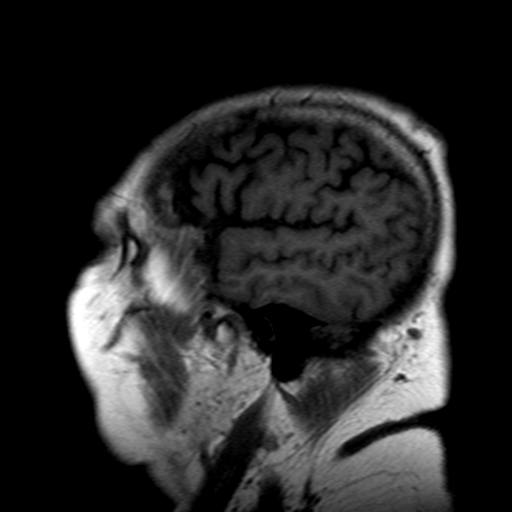
[im 8/24]
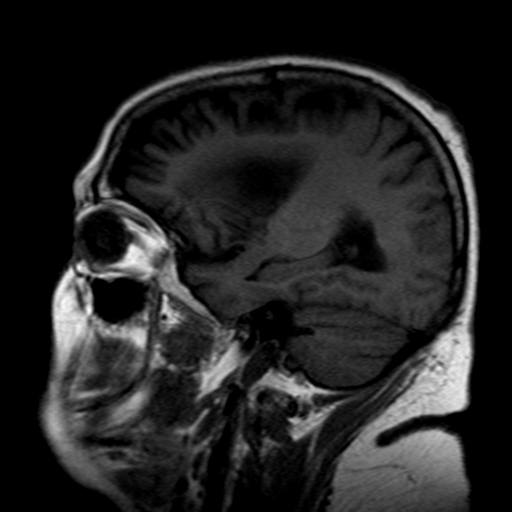
[im 12/24]
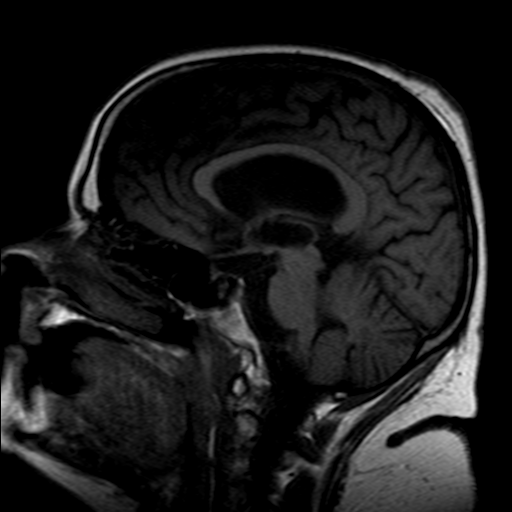
[im 16/24]
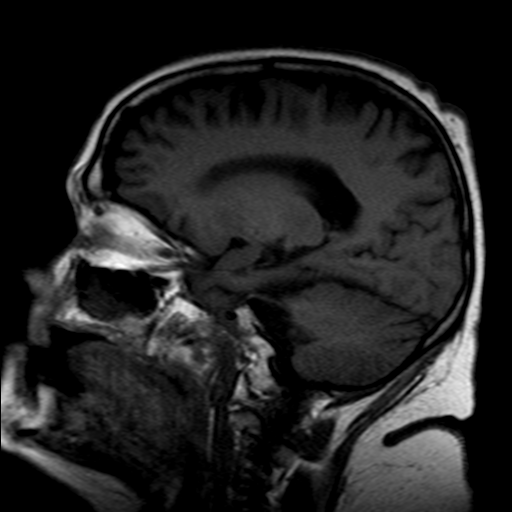

[Series 13: T2 · axial · 5.0mm · 0.45mm/px · z∈[-56,+108]mm · 7 of 24 slices shown]
[im 1/24]
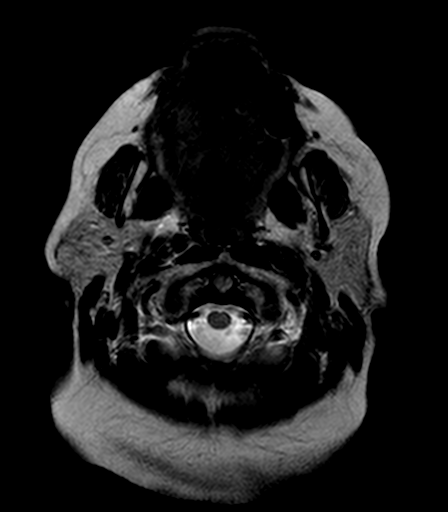
[im 4/24]
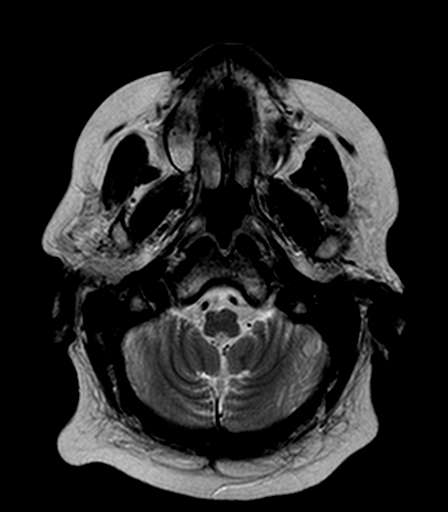
[im 8/24]
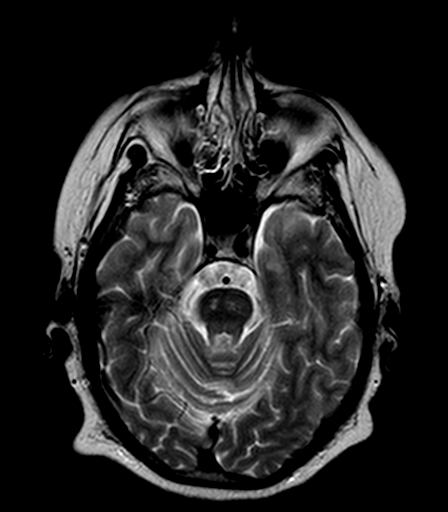
[im 12/24]
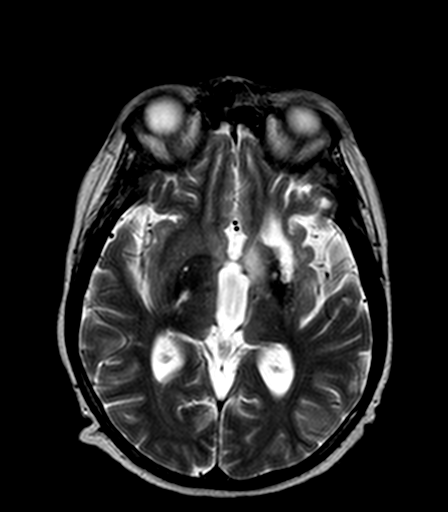
[im 16/24]
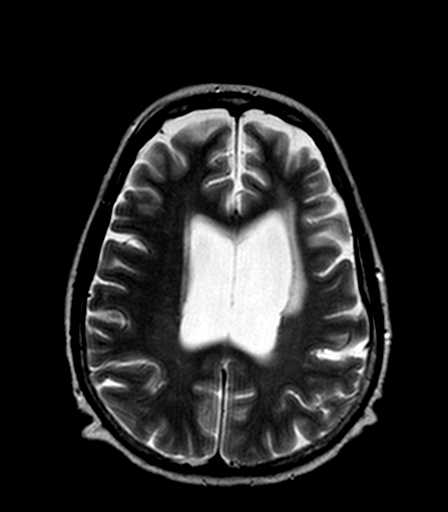
[im 20/24]
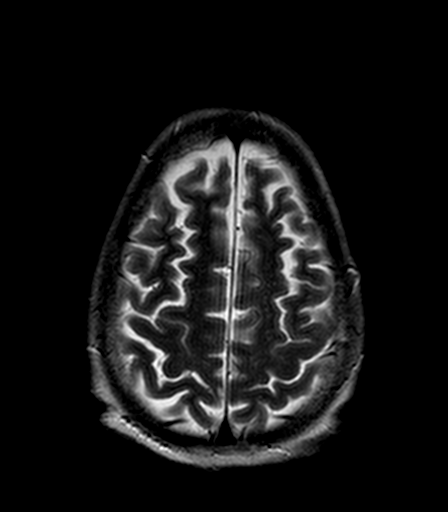
[im 24/24]
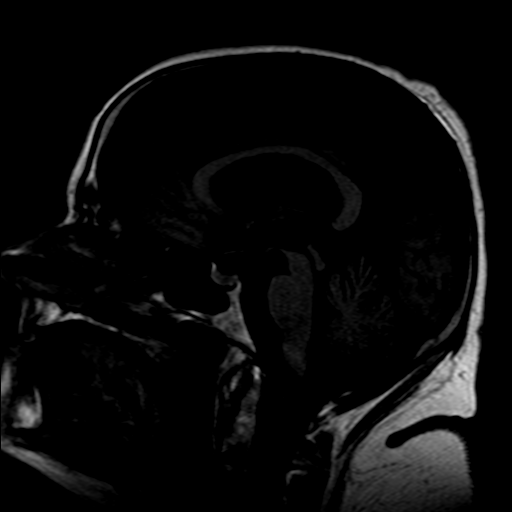

[Series 14: FLAIR · axial · 5.0mm · 0.90mm/px · z∈[-56,+108]mm · 7 of 24 slices shown]
[im 1/24]
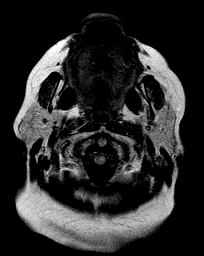
[im 4/24]
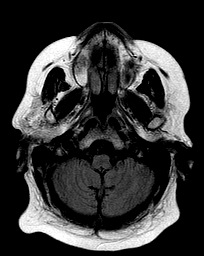
[im 8/24]
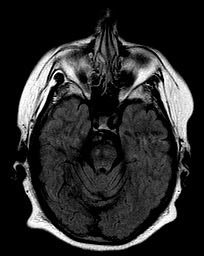
[im 12/24]
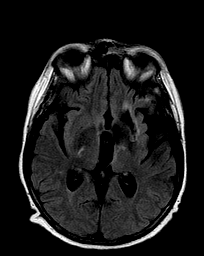
[im 16/24]
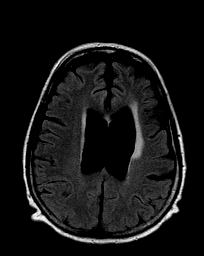
[im 20/24]
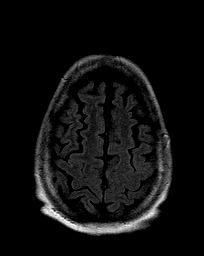
[im 24/24]
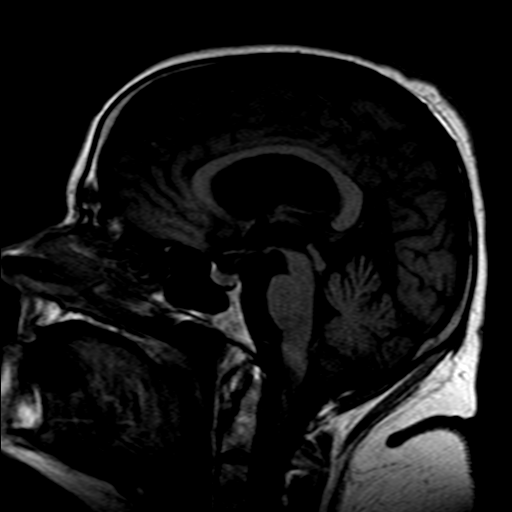

[Series 5002: DWI · axial · 5.0mm · 1.80mm/px · z∈[-40,+108]mm · 6 of 22 slices shown]
[im 1/22]
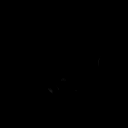
[im 5/22]
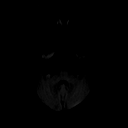
[im 9/22]
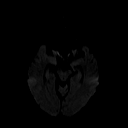
[im 13/22]
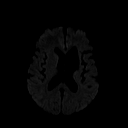
[im 17/22]
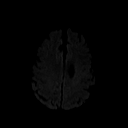
[im 22/22]
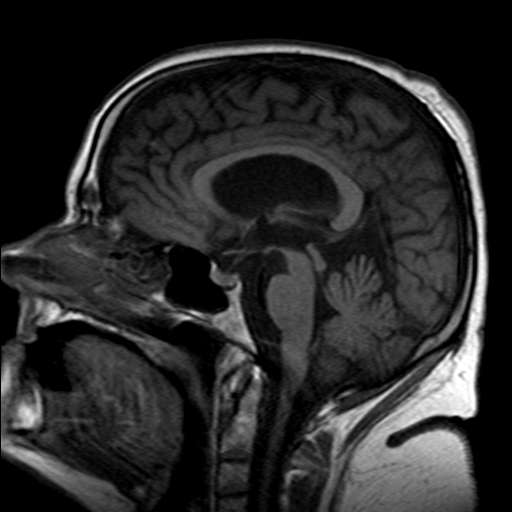

[Series 5003: ADC · axial · 5.0mm · 1.80mm/px · z∈[-40,+108]mm · 6 of 22 slices shown]
[im 1/22]
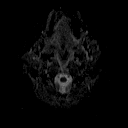
[im 5/22]
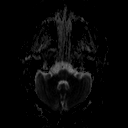
[im 9/22]
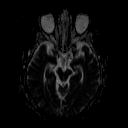
[im 13/22]
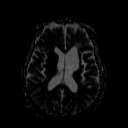
[im 17/22]
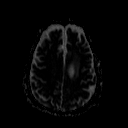
[im 22/22]
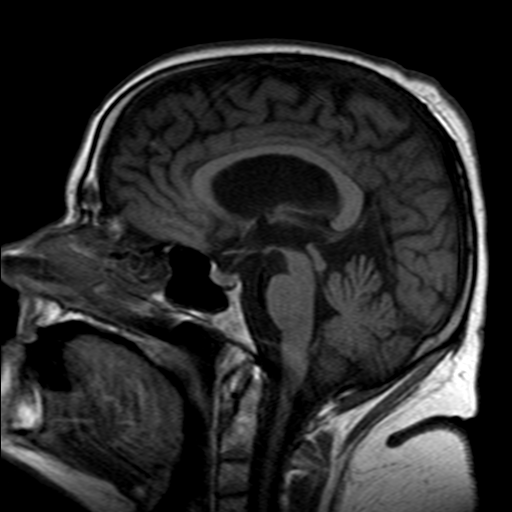

[Series 5009: T1 · coronal · 5.0mm · 0.86mm/px · 8 of 28 slices shown]
[im 1/28]
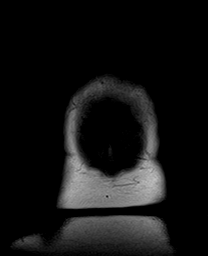
[im 4/28]
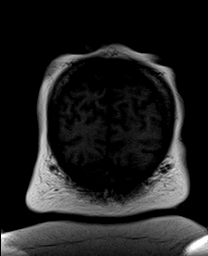
[im 8/28]
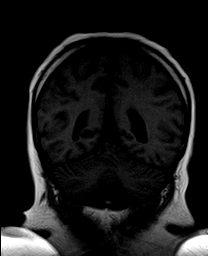
[im 12/28]
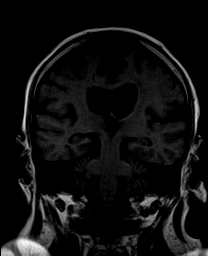
[im 16/28]
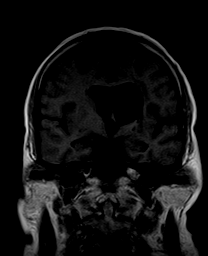
[im 20/28]
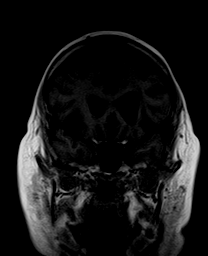
[im 24/28]
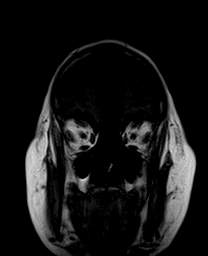
[im 28/28]
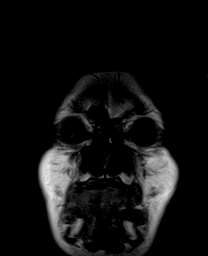

[39 of 48 positions shown; findings below may reference images not displayed]

FINDINGS: There is no restricted diffusion to suggest acute ischemia. There is no
abnormal extra-axial collection. The left basal ganglia and caudate are not
definitely identified. There is expansion of the frontal horn of the left
lateral ventricle. This is likely due to old left basal ganglia and caudate
infarct. Findings are similar compared to MRI on 08/15/2005. Mild
periventricular white matter changes are seen, nonspecific, but are most
commonly due to chronic small vessel disease. There is no intracranial mass
or midline shift. There is no abnormal contrast enhancement. The ventricles
are within normal limits in size for patient's age. The basal cisterns are
patent. The visualized paranasal sinuses and mastoid air cells are
unremarkable.
IMPRESSION: 1. Findings are similar compare to the previous MRI. No evidence of acute
ischemia.

## 2009-01-26 ENCOUNTER — Encounter: Admission: RE | Admit: 2009-01-26 | Discharge: 2009-04-01 | Payer: Self-pay | Admitting: Neurology

## 2009-02-28 ENCOUNTER — Emergency Department: Payer: Self-pay | Admitting: Internal Medicine

## 2009-03-01 IMAGING — CR RIGHT HAND - COMPLETE 3+ VIEW
1 series · 3 of 3 positions shown · non-contrast
Comparison: none

REASON FOR EXAM: Fell, pain
COMMENTS:

[Series 2: view not recorded · 0.17mm/px · 3 of 3 slices shown]
[im 1/3]
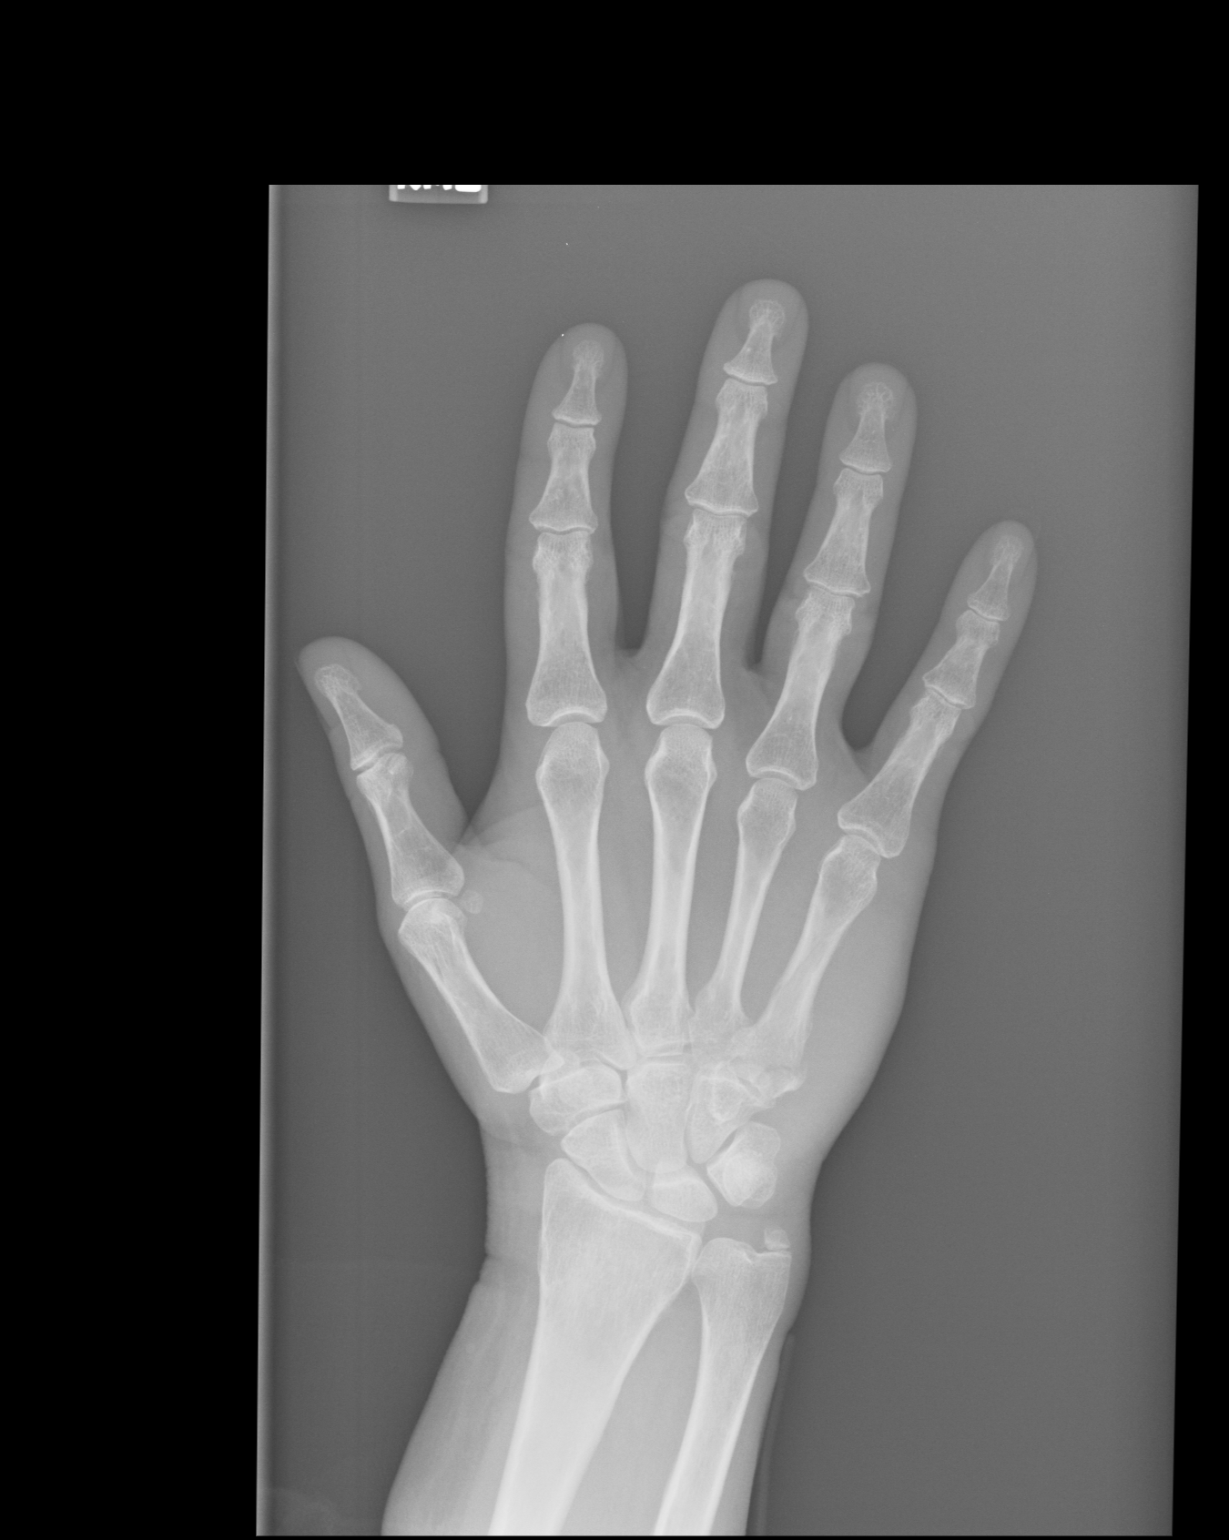
[im 2/3]
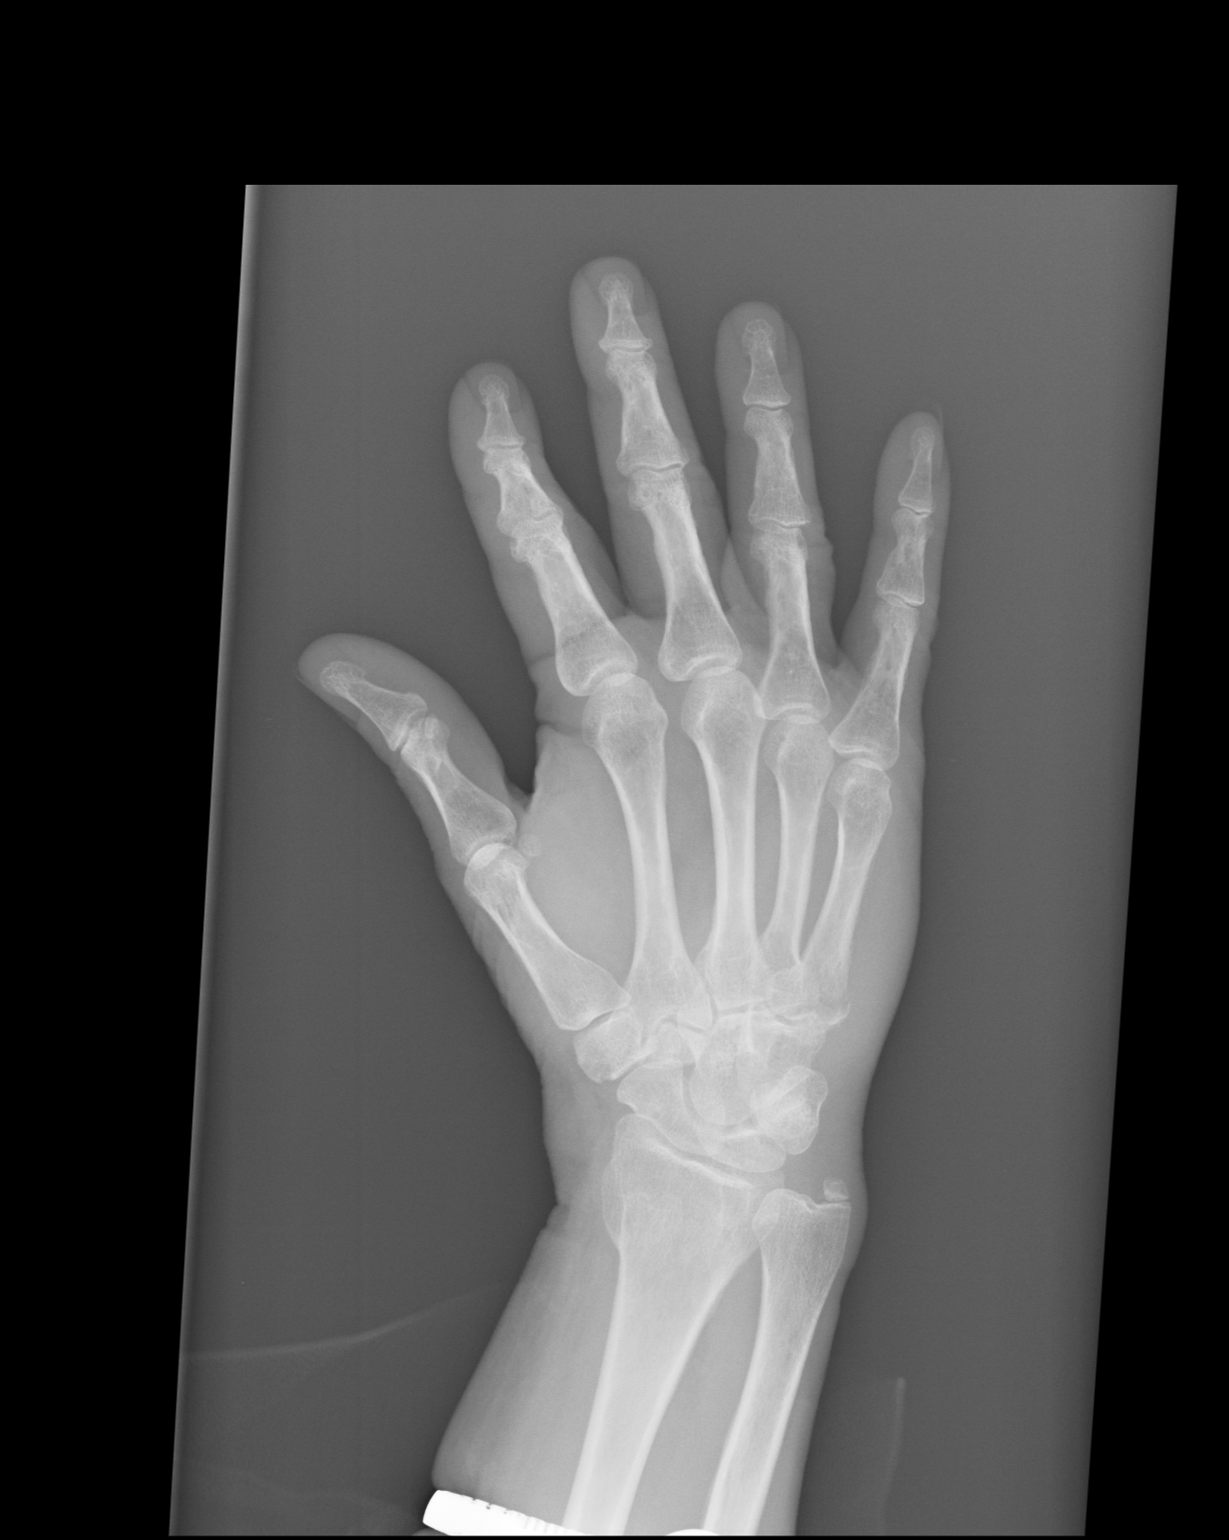
[im 3/3]
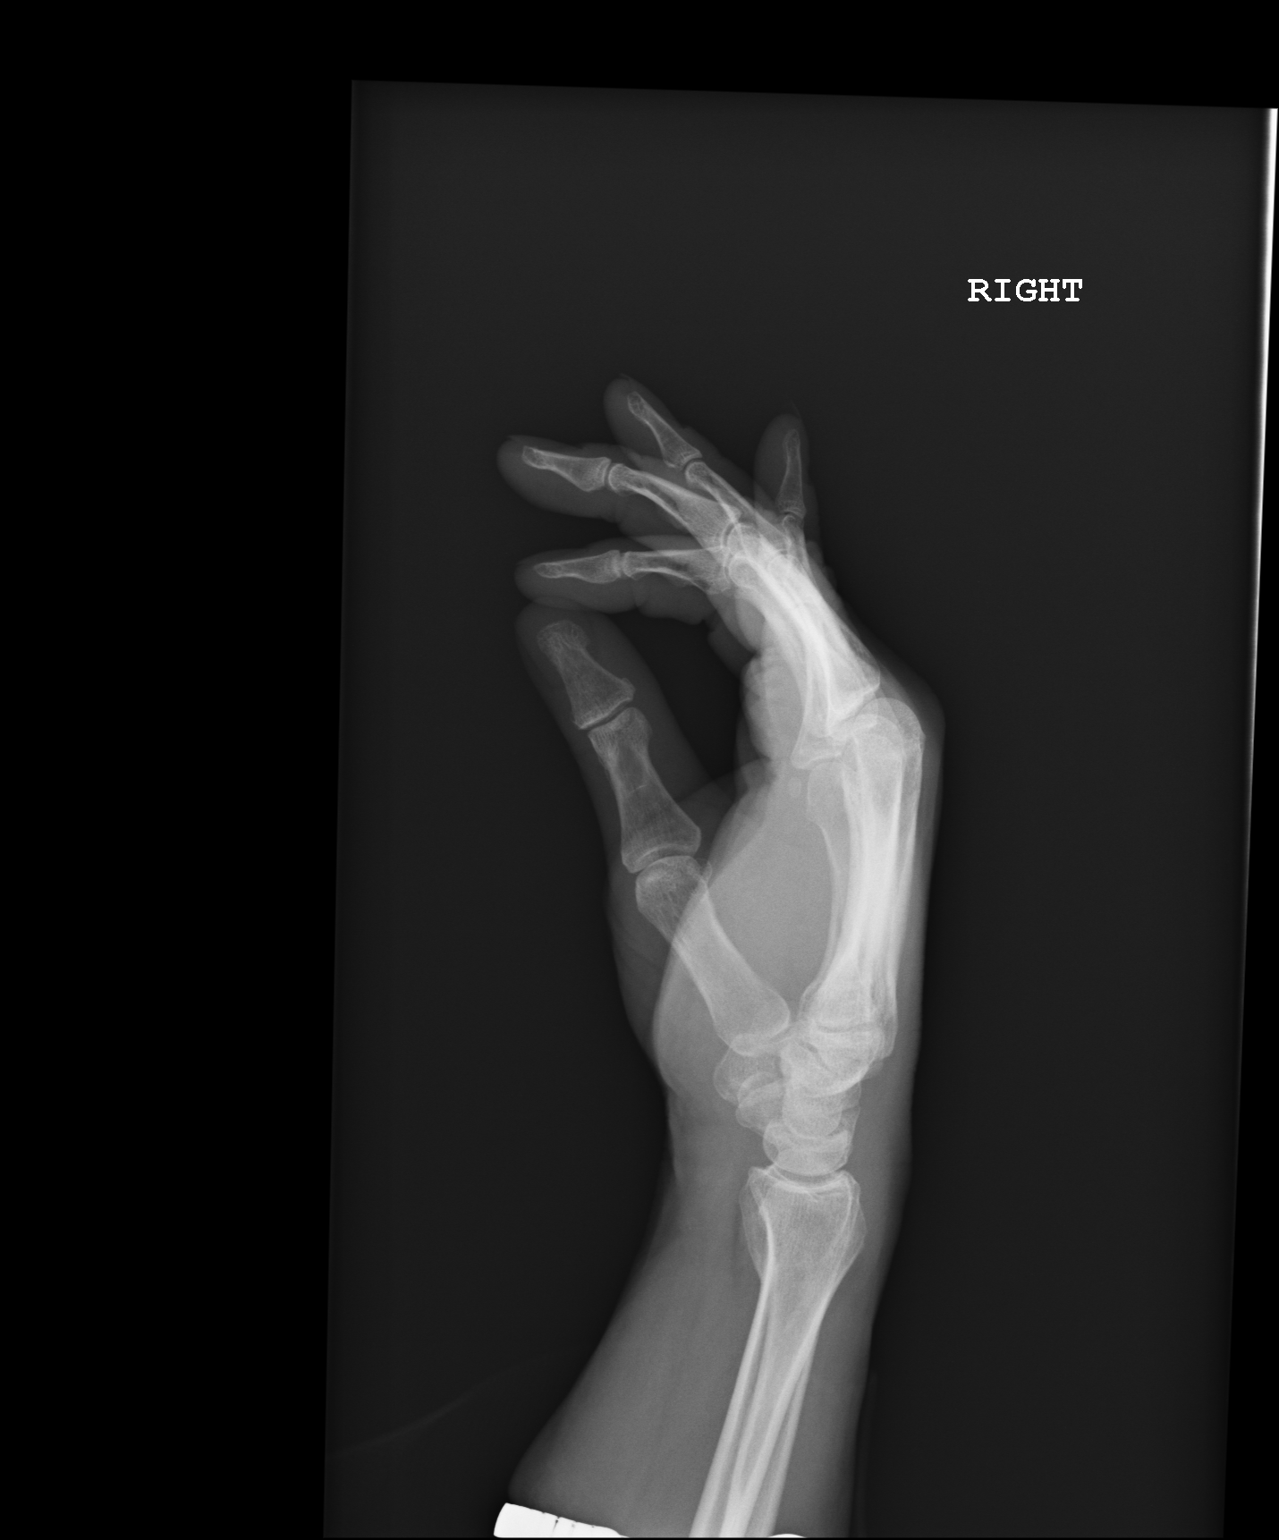

[3 of 3 positions shown; findings below may reference images not displayed]

PROCEDURE:     KDR - KDXR HAND RT COMPLETE W/OBLIQUES  - May 03, 2007  [DATE]

RESULT:     Images of the RIGHT hand are underexposed. There is a fracture
at the base of the ulnar styloid process. Degenerative interphalangeal
changes are noted. The fracture in the wrist may be old. No other
significant finding is seen.
IMPRESSION: Fracture at the base of the ulnar styloid process which is
likely old. Clinical correlation is recommended. There are some
interphalangeal degenerative changes which are relatively mild and more
prominent proximally.

## 2009-03-11 ENCOUNTER — Ambulatory Visit: Payer: Self-pay | Admitting: Family Medicine

## 2009-03-23 ENCOUNTER — Ambulatory Visit: Payer: Self-pay | Admitting: Urology

## 2009-05-04 ENCOUNTER — Ambulatory Visit: Payer: Self-pay | Admitting: Family Medicine

## 2009-06-16 IMAGING — CT CT HEAD WITHOUT CONTRAST
2 series · 15 of 30 positions shown, 19 images · non-contrast
Comparison: none

REASON FOR EXAM: fall  -  waiting room
COMMENTS:   LMP: Post Hysterectomy

[Series 2: without · axial · non-contrast · 0.46mm/px · z∈[+935,+1060]mm · 13 of 31 slices shown, 17 images]
[im 3/31  brain]
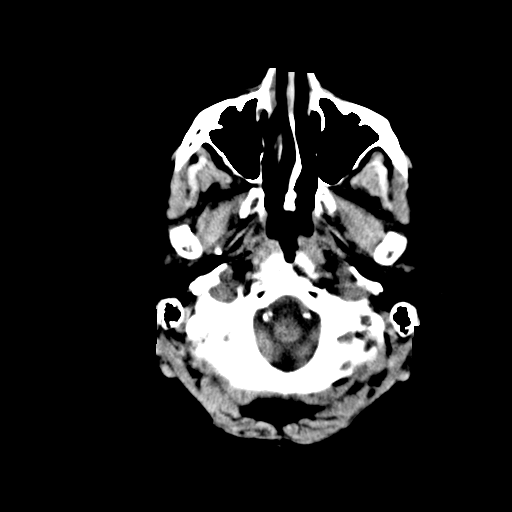
[im 3/31  bone]
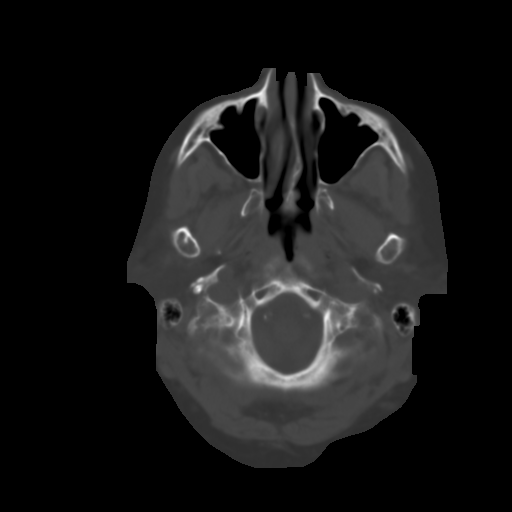
[im 5/31  brain]
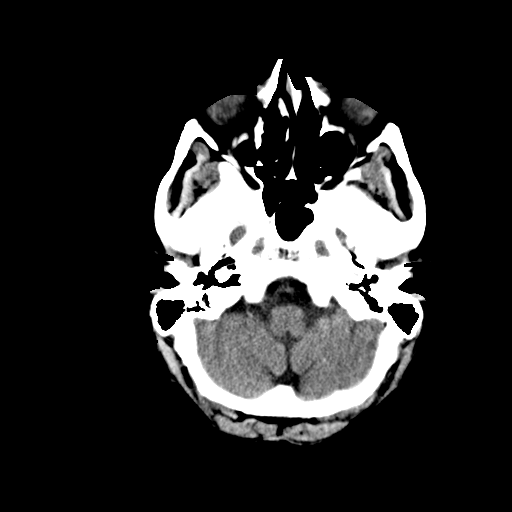
[im 7/31  brain]
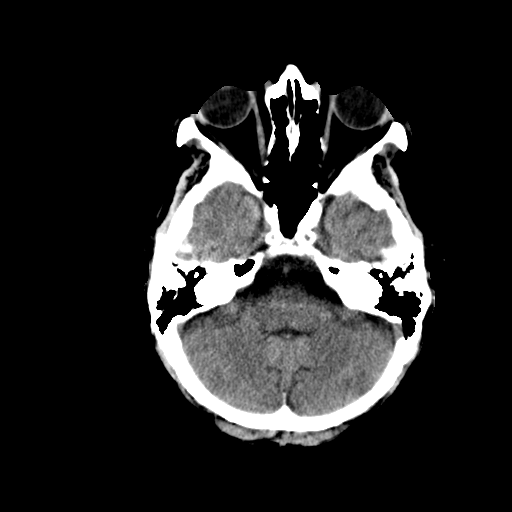
[im 9/31  brain]
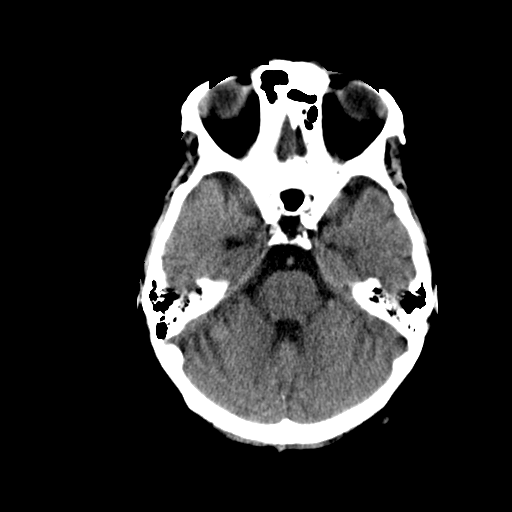
[im 11/31  brain]
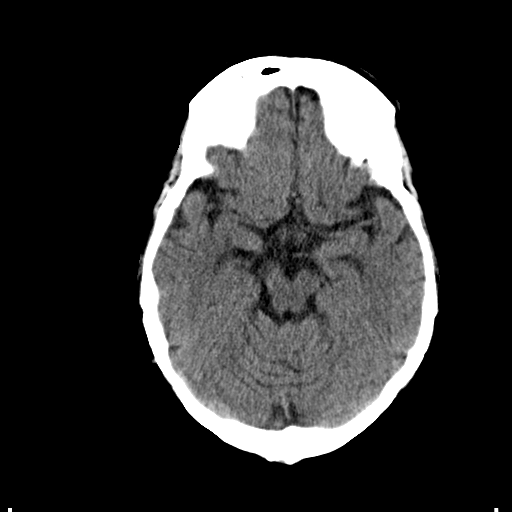
[im 11/31  bone]
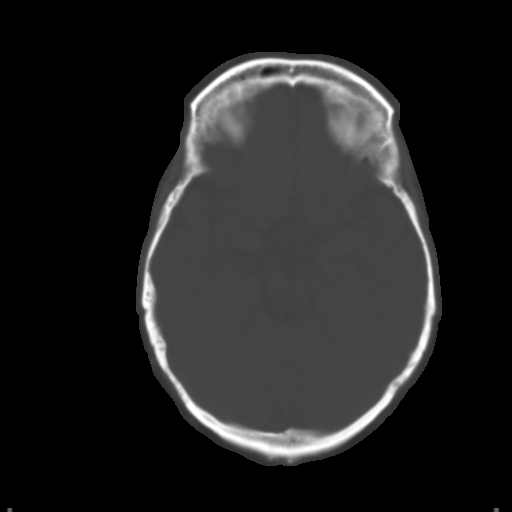
[im 13/31  brain]
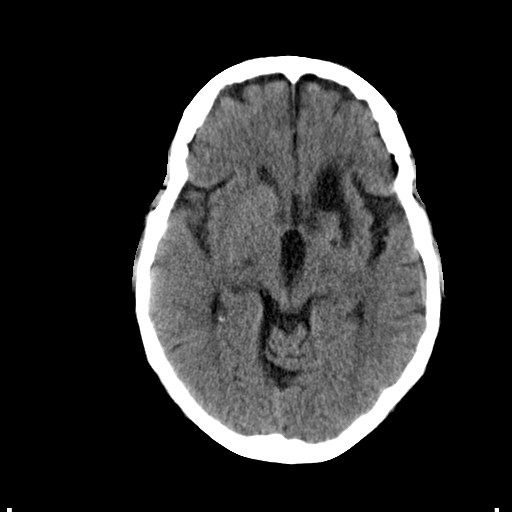
[im 16/31  brain]
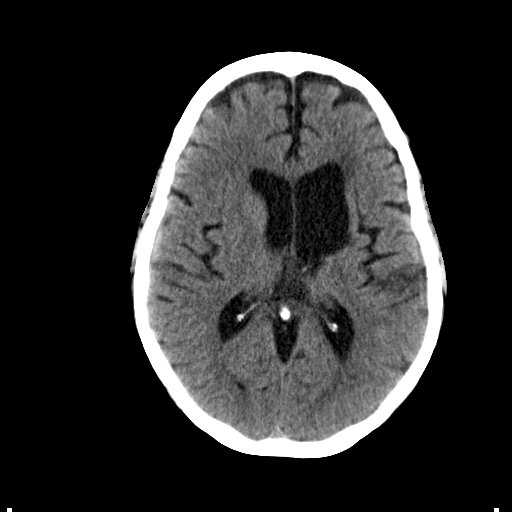
[im 18/31  brain]
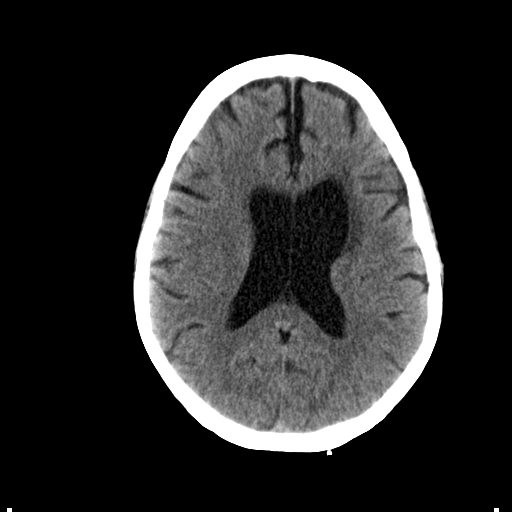
[im 20/31  brain]
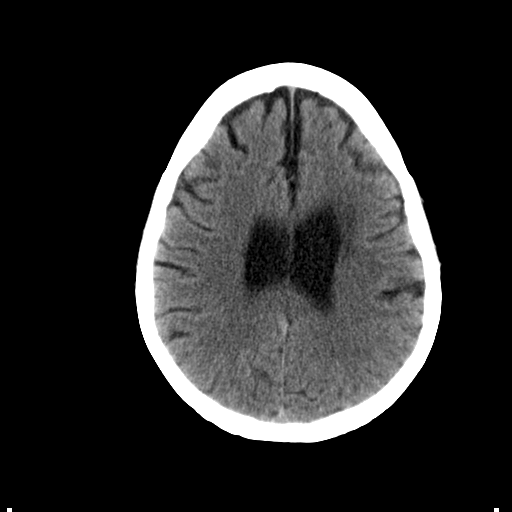
[im 20/31  bone]
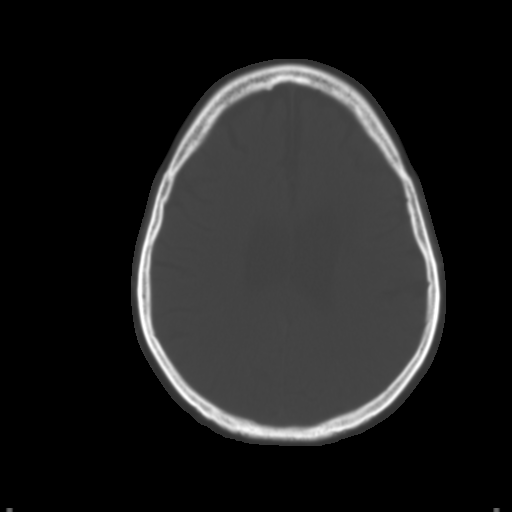
[im 22/31  brain]
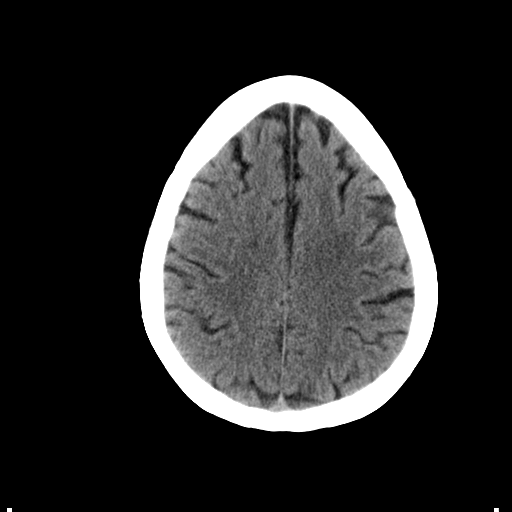
[im 24/31  brain]
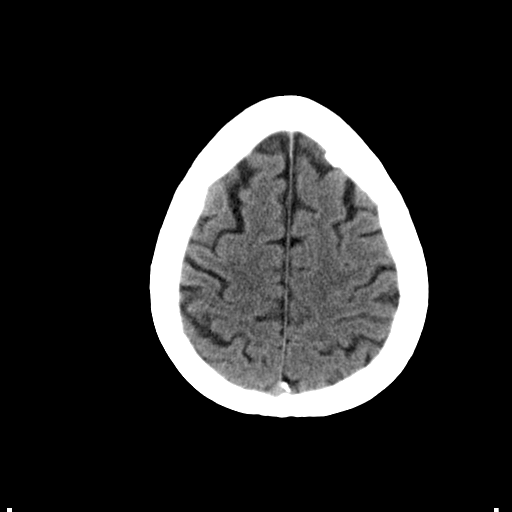
[im 26/31  brain]
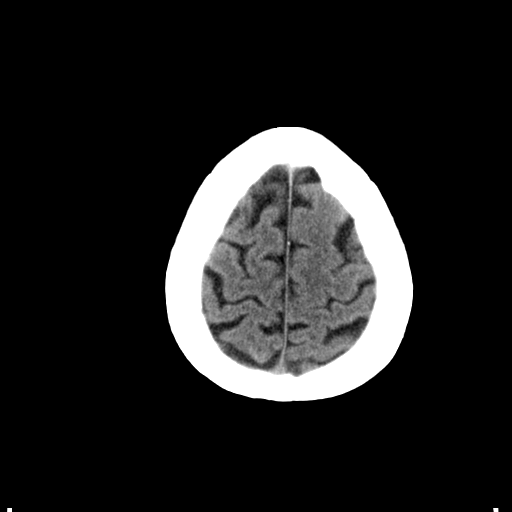
[im 28/31  brain]
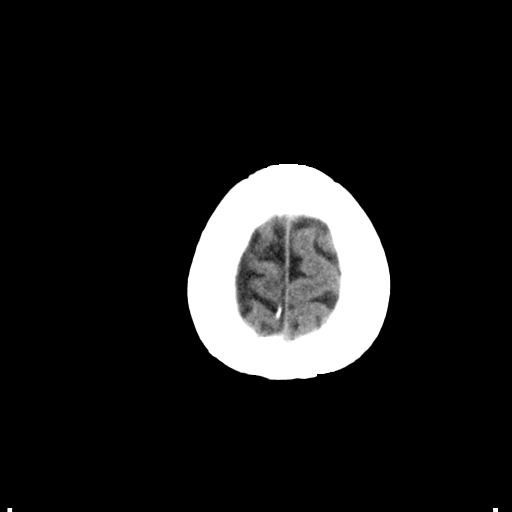
[im 28/31  bone]
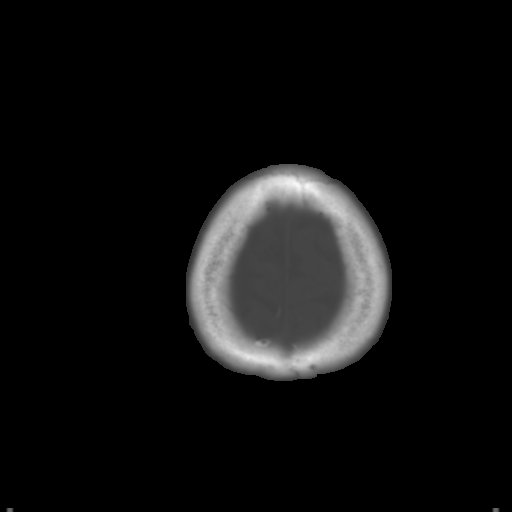

[Series 3: bone · axial · 0.46mm/px · z∈[+935,+955]mm · 2 of 31 slices shown]
[im 3/31  bone]
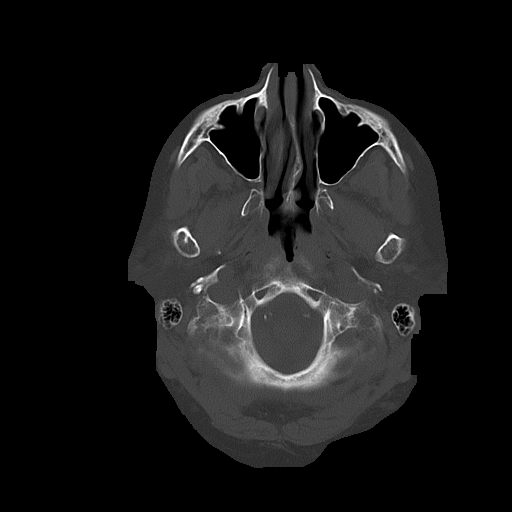
[im 7/31  bone]
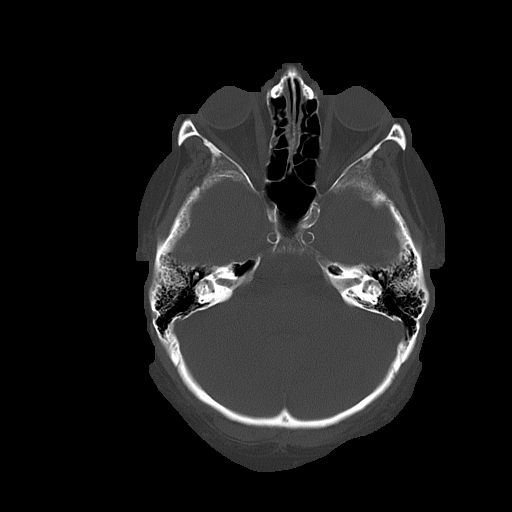

[15 of 30 positions shown; findings below may reference images not displayed]

PROCEDURE:     CT  - CT HEAD WITHOUT CONTRAST  - August 18, 2007  [DATE]

RESULT:     The ventricles are normal in position. There is dilation of the
frontal horn of the LEFT lateral ventricle due to ex vacuo effects from
prior CVA. There is no intracranial hemorrhage, mass, or mass-effect. The
ventricular changes on the LEFT are stable when compared to a study 19 March, 2007. The cerebellum and brainstem are normal in density. At bone
window settings, the observed portions of the paranasal sinuses appear
clear. There is a small amount of soft tissue swelling over the LEFT orbit
but no underlying fracture is seen.
IMPRESSION: There are chronic changes present from prior CVA involving
the anterior aspect of the basal ganglia on the LEFT in the deep white
matter and LEFT anterior parietal lobe. I do not see evidence of an acute
ischemic or hemorrhagic event. There is a small amount of soft tissue
swelling over the LEFT orbit.

A preliminary report was sent to the [HOSPITAL] the conclusion
of the study.

## 2009-06-21 ENCOUNTER — Ambulatory Visit: Payer: Self-pay | Admitting: Family Medicine

## 2009-11-16 LAB — HM DEXA SCAN

## 2010-01-18 ENCOUNTER — Inpatient Hospital Stay: Payer: Self-pay | Admitting: Internal Medicine

## 2010-02-18 IMAGING — CT CT L SPINE W/ CM
2 of 8 series · 4 of 20 positions shown, 5 images · IV contrast (omnipaque)
Comparison: None.
COMPARISON: Lumbar MRI 12/13/2002.

CLINICAL DATA: 52-year-old female with chronic back pain involving
the hips right greater than left.  Pain extends down the posterior
right leg to the knee with "restlessness" of both legs.

MYELOGRAM LUMBAR
TECHNIQUE: Intrathecal contrast was administered by Dr. Kaiser
Hm  via lumbar puncture at the L5-S1 level. Following
injection of intrathecal Omnipaque contrast, spine imaging in
multiple projections was performed using fluoroscopy.
Fluoroscopy Time: 0.29 minutes.
TECHNIQUE: CT imaging of the lumbar spine was performed after
intrathecal contrast administration.  Multiplanar CT image
reconstructions were also generated.

[Series 2: l-spine helical · axial · 0.27mm/px · z∈[-320,-137]mm · 3 of 94 slices shown, 4 images]
[im 1/94  soft-tissue]
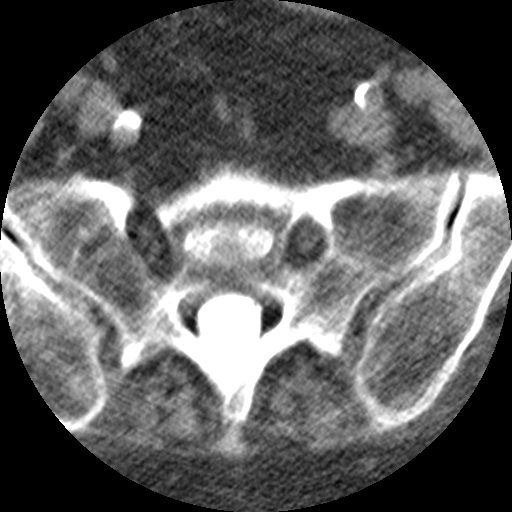
[im 1/94  bone]
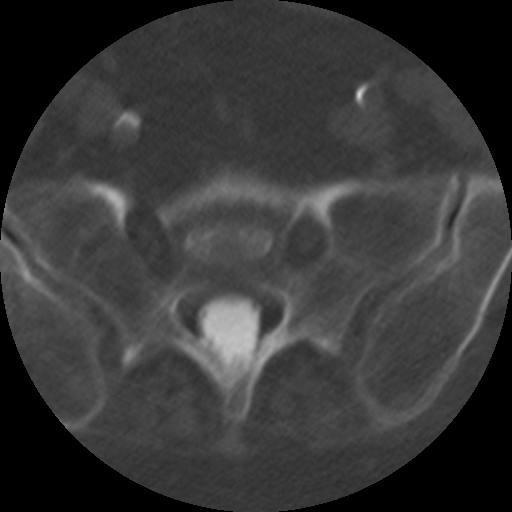
[im 47/94  bone]
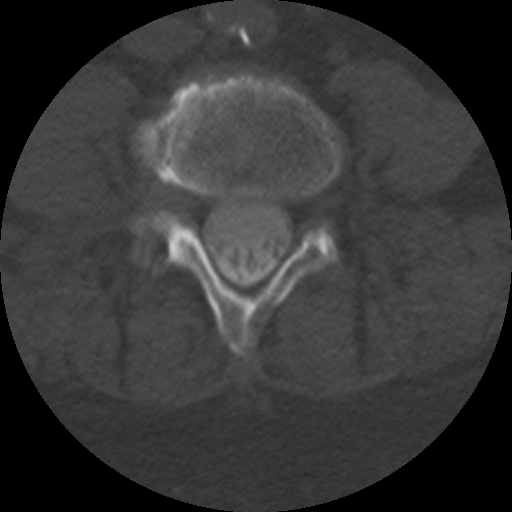
[im 94/94  bone]
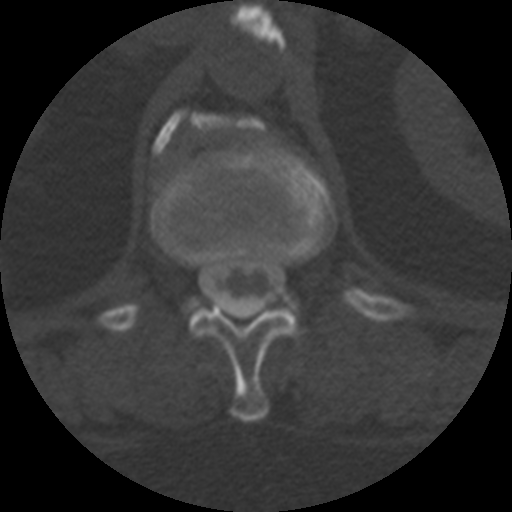

[Series 104: coronal lspine- detail · coronal · 0.39mm/px · 1 of 56 slices shown]
[im 28/56  bone]
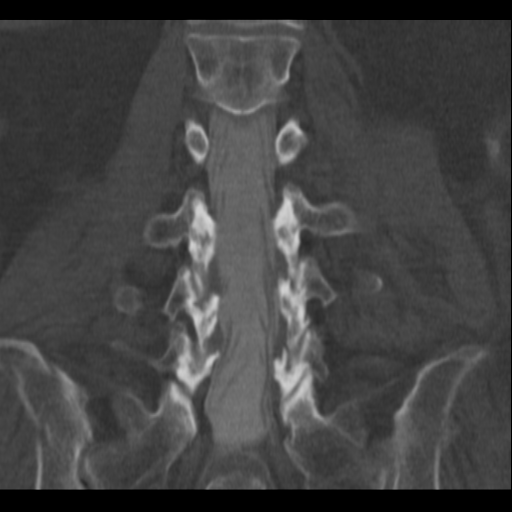

[4 of 20 positions shown; findings below may reference images not displayed]

FINDINGS: There is a moderate ventral defect at L4-L5.  However, AP
and oblique views suggest right lateral recess stenosis without
significant central spinal stenosis.  There is a paucity of
contrast above the L2 level despite Trendelenburg positioning.  A
fluoroscopic lateral view shows moderate ventral defect at L1-L2
and suggestion of moderate to severe spinal stenosis resulting.
The thecal sac at the thoracolumbar junction and has a more normal
appearance.  The conus medullaris is not delineated.  There is a
mild ventral defect at L2-L3.  The L3-L4 level and L5-L1 levels
appear within normal limits.
IMPRESSION: 1.  Moderate spinal stenosis suspected at L1-L2.
2.  Moderate ventral defect at L4-L5 appears to result primarily in
right lateral recess stenosis.
3. See post myelogram CT findings below.

CT MYELOGRAPHY LUMBAR SPINE
FINDINGS: Extensive calcified atherosclerosis of the abdominal
aorta involving all major branch origins and extending into the
iliac arteries.  The study is intermittently degraded by motion
artifact despite repeated imaging.  Bilateral vacuum sacroiliac
joint phenomena.  Visualized sacrum is intact.  Multilevel moderate
to severe degenerative endplate changes, worst at L2-L3 and L4-L5.
Extensive L4-L5 vacuum disc phenomena.  Moderate right posterior L1
Schmorl's node.  No spondylolisthesis. No acute osseous abnormality
identified.

Adequate intrathecal contrast.  The conus medullaris is located at
L1-L2.  The lower lumbar spinal canal is patulous.  The cauda
equina roots appear within normal limits.

T12-L1:  Left greater than right moderate facet hypertrophy.  No
stenosis.

L1-L2:  Partially calcified central disc osteophyte protrusion.
Moderate facet hypertrophy.  Bilateral ligament flavum
calcification.  Combined, there is mild overall spinal stenosis (AP
thecal sac 9 - 9.5 mm).  No significant foraminal stenosis. The
appearance is stable.

L2-L3:  Overall stable left eccentric circumferential disc
protrusion with right paracentral partially calcified superimposed
protrusion.  Mild to moderate facet hypertrophy.  Stable mild
bilateral multifactorial L2 foraminal stenosis; the left L2 nerve
root may be abutted by disc osteophyte.  Vacuum disc phenomenon
anteriorly.  No spinal stenosis.

L3-L4:  Moderate left greater than right facet hypertrophy.  Mild
bilateral L3 foraminal stenosis, primarily due to facet.  No
significant disc protrusion or central stenosis.

L4-L5:  Right eccentric circumferential disc osteophyte complex,
very similar to the prior exam.  There is moderate to severe right
greater than left facet hypertrophy.  As a result, there is
moderate stenosis of the right lateral recess and severe right L4
neural foraminal stenosis.  The left foramen is mildly stenosed,
primarily due to facet changes.  No spinal stenosis.  These are
stable.

L5-S1:  Moderate left greater than right facet hypertrophy.  No
disc protrusion or spinal stenosis.  Patulous canal this level.
Stable mild to moderate bilateral L5 neural foraminal stenosis,
primarily due to facet disease.
IMPRESSION: 1. Partially calcified disc/disc osteophyte protrusion combined
with moderate facet hypertrophy and ligament flavum calcification
at L1-L2 results in spinal stenosis which is stable since 1555 (AP
thecal sac 9 - 9.5 mm).
2.  Disc osteophyte complex and severe facet hypertrophy at L4-L5
result in severe right L4 neural foraminal stenosis and moderate
right lateral recess stenosis. These appear stable since [DATE].  Mild to moderate left L2 and bilateral L5 neural foraminal
stenosis appears stable since 1555.  The extraforaminal left L2
nerve root may be abutted by disc osteophyte complex.

## 2010-02-18 IMAGING — RF DG MYELOGRAM LUMBAR
8 series · 8 of 8 positions shown · IV contrast (omnipaque)
Comparison: None.
COMPARISON: Lumbar MRI 12/13/2002.

CLINICAL DATA: 52-year-old female with chronic back pain involving
the hips right greater than left.  Pain extends down the posterior
right leg to the knee with "restlessness" of both legs.

MYELOGRAM LUMBAR
TECHNIQUE: Intrathecal contrast was administered by Dr. Kaiser
Hm  via lumbar puncture at the L5-S1 level. Following
injection of intrathecal Omnipaque contrast, spine imaging in
multiple projections was performed using fluoroscopy.
Fluoroscopy Time: 0.29 minutes.
TECHNIQUE: CT imaging of the lumbar spine was performed after
intrathecal contrast administration.  Multiplanar CT image
reconstructions were also generated.

[Series 1: run · 1 of 1 slices shown (1 of 8)]
[im 1/1]
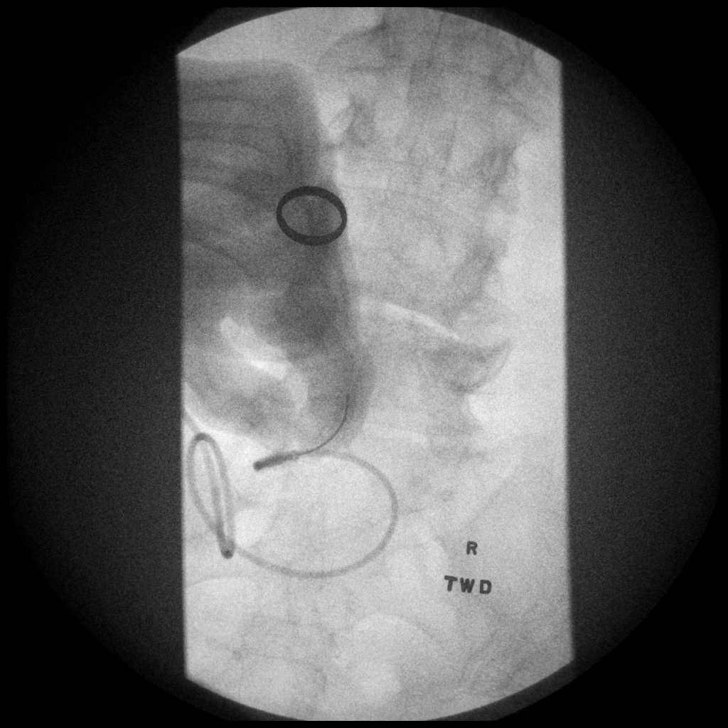

[Series 2: run · 1 of 1 slices shown (2 of 8)]
[im 1/1]
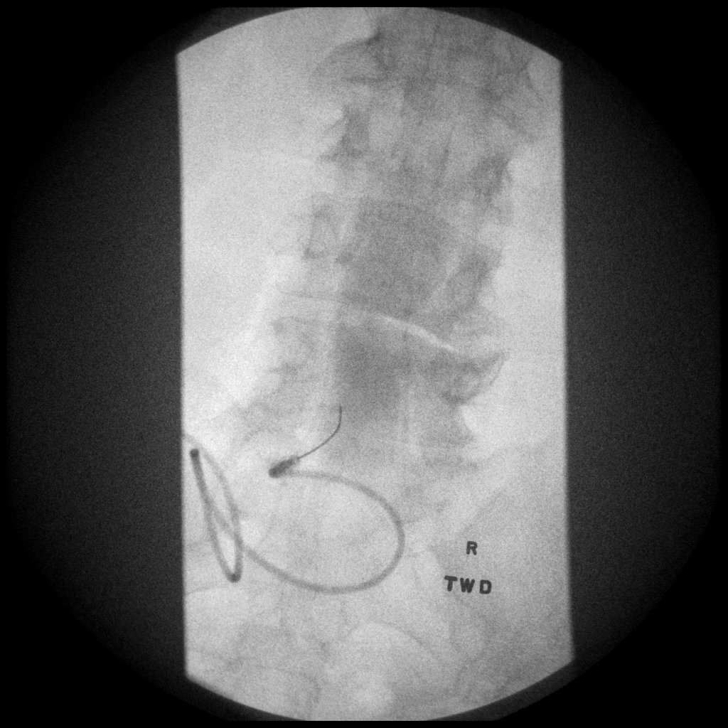

[Series 3: run · 1 of 1 slices shown (3 of 8)]
[im 1/1]
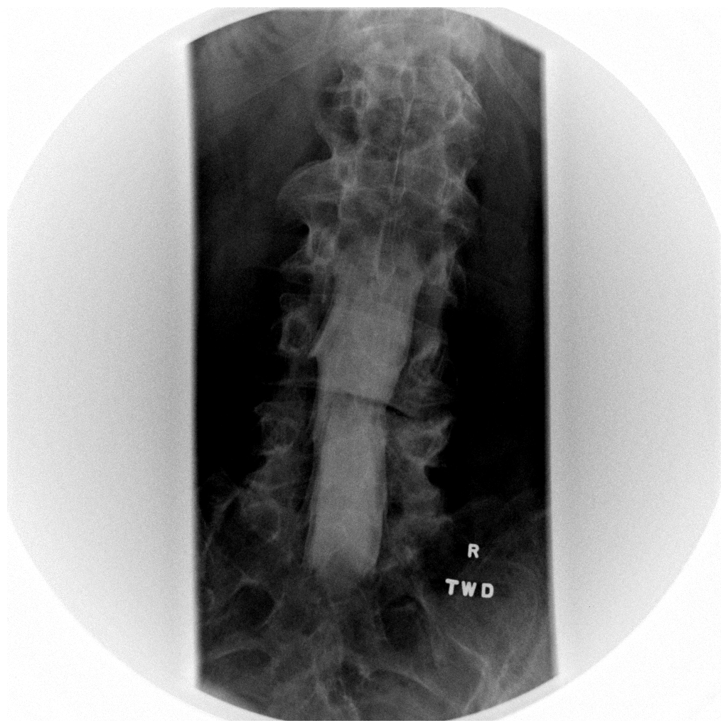

[Series 4: run · 1 of 1 slices shown (4 of 8)]
[im 1/1]
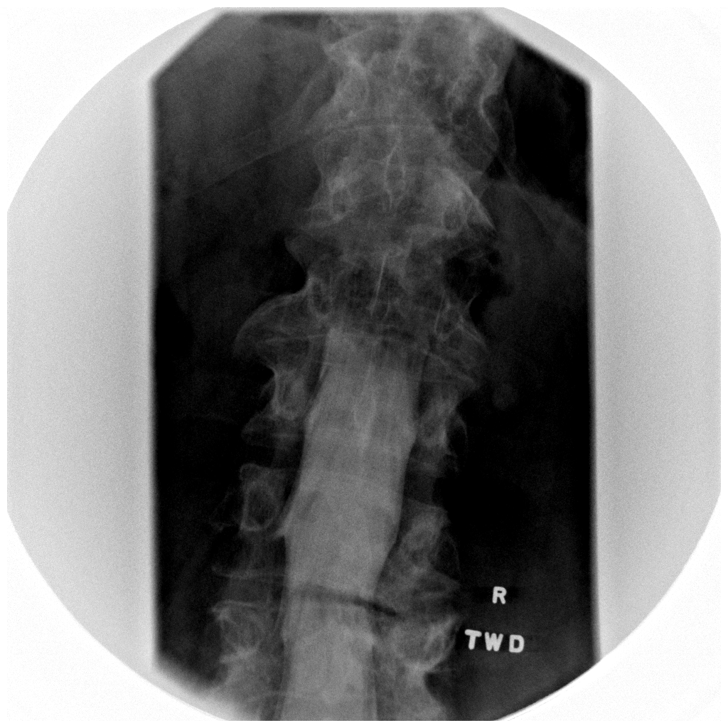

[Series 5: run · 1 of 1 slices shown (5 of 8)]
[im 1/1]
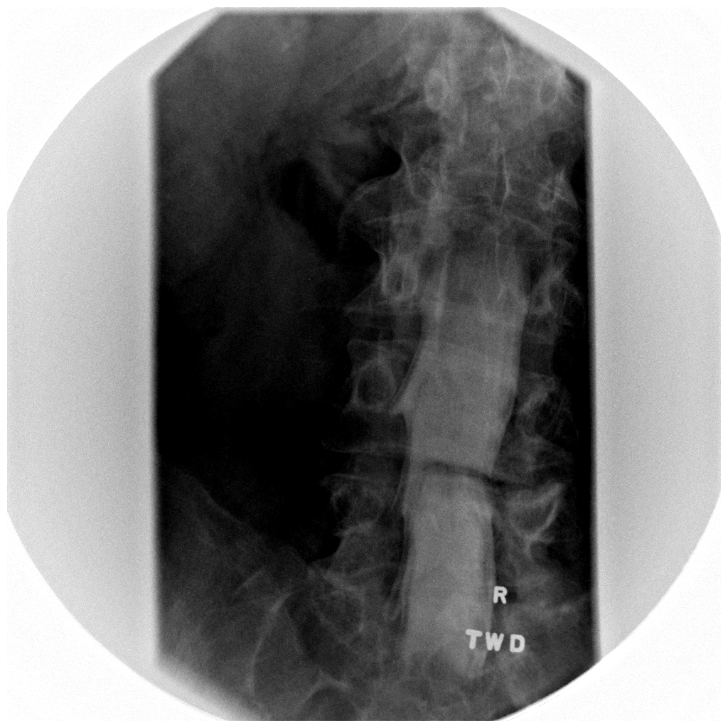

[Series 6: run · 1 of 1 slices shown (6 of 8)]
[im 1/1]
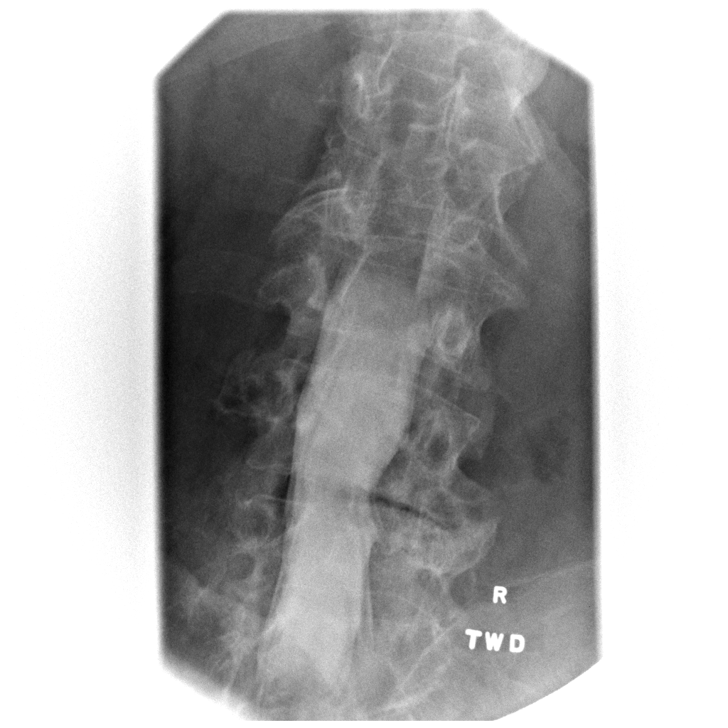

[Series 7: run · 1 of 1 slices shown (7 of 8)]
[im 1/1]
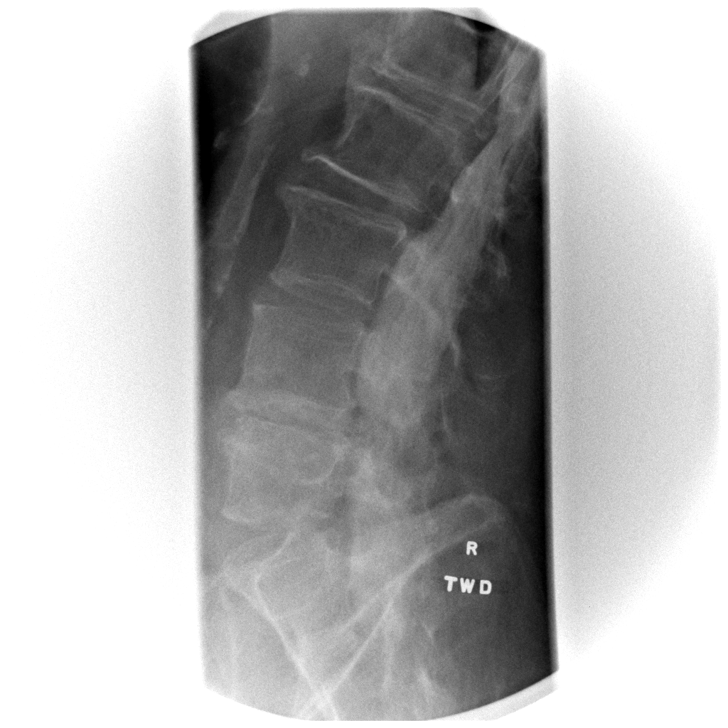

[Series 8: run · 1 of 1 slices shown (8 of 8)]
[im 1/1]
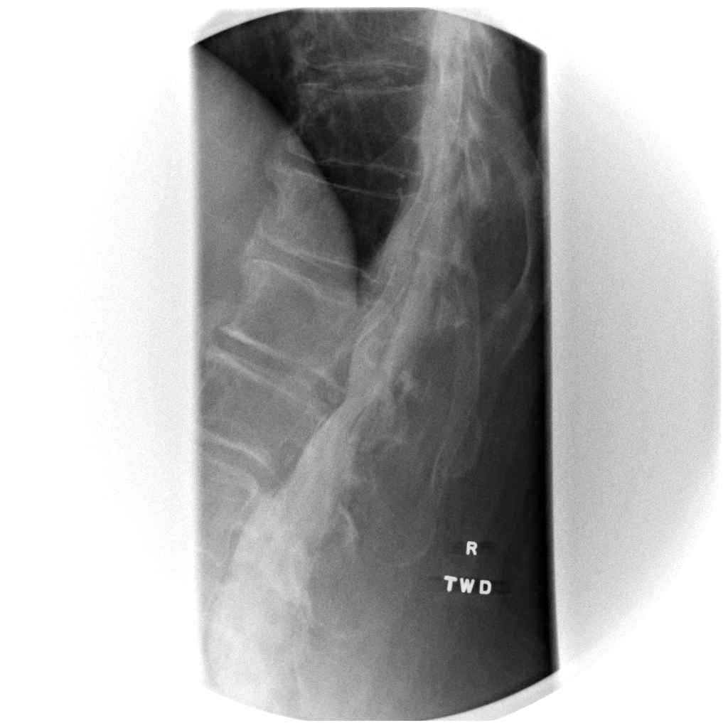

[8 of 8 positions shown; findings below may reference images not displayed]

FINDINGS: There is a moderate ventral defect at L4-L5.  However, AP
and oblique views suggest right lateral recess stenosis without
significant central spinal stenosis.  There is a paucity of
contrast above the L2 level despite Trendelenburg positioning.  A
fluoroscopic lateral view shows moderate ventral defect at L1-L2
and suggestion of moderate to severe spinal stenosis resulting.
The thecal sac at the thoracolumbar junction and has a more normal
appearance.  The conus medullaris is not delineated.  There is a
mild ventral defect at L2-L3.  The L3-L4 level and L5-L1 levels
appear within normal limits.
IMPRESSION: 1.  Moderate spinal stenosis suspected at L1-L2.
2.  Moderate ventral defect at L4-L5 appears to result primarily in
right lateral recess stenosis.
3. See post myelogram CT findings below.

CT MYELOGRAPHY LUMBAR SPINE
FINDINGS: Extensive calcified atherosclerosis of the abdominal
aorta involving all major branch origins and extending into the
iliac arteries.  The study is intermittently degraded by motion
artifact despite repeated imaging.  Bilateral vacuum sacroiliac
joint phenomena.  Visualized sacrum is intact.  Multilevel moderate
to severe degenerative endplate changes, worst at L2-L3 and L4-L5.
Extensive L4-L5 vacuum disc phenomena.  Moderate right posterior L1
Schmorl's node.  No spondylolisthesis. No acute osseous abnormality
identified.

Adequate intrathecal contrast.  The conus medullaris is located at
L1-L2.  The lower lumbar spinal canal is patulous.  The cauda
equina roots appear within normal limits.

T12-L1:  Left greater than right moderate facet hypertrophy.  No
stenosis.

L1-L2:  Partially calcified central disc osteophyte protrusion.
Moderate facet hypertrophy.  Bilateral ligament flavum
calcification.  Combined, there is mild overall spinal stenosis (AP
thecal sac 9 - 9.5 mm).  No significant foraminal stenosis. The
appearance is stable.

L2-L3:  Overall stable left eccentric circumferential disc
protrusion with right paracentral partially calcified superimposed
protrusion.  Mild to moderate facet hypertrophy.  Stable mild
bilateral multifactorial L2 foraminal stenosis; the left L2 nerve
root may be abutted by disc osteophyte.  Vacuum disc phenomenon
anteriorly.  No spinal stenosis.

L3-L4:  Moderate left greater than right facet hypertrophy.  Mild
bilateral L3 foraminal stenosis, primarily due to facet.  No
significant disc protrusion or central stenosis.

L4-L5:  Right eccentric circumferential disc osteophyte complex,
very similar to the prior exam.  There is moderate to severe right
greater than left facet hypertrophy.  As a result, there is
moderate stenosis of the right lateral recess and severe right L4
neural foraminal stenosis.  The left foramen is mildly stenosed,
primarily due to facet changes.  No spinal stenosis.  These are
stable.

L5-S1:  Moderate left greater than right facet hypertrophy.  No
disc protrusion or spinal stenosis.  Patulous canal this level.
Stable mild to moderate bilateral L5 neural foraminal stenosis,
primarily due to facet disease.
IMPRESSION: 1. Partially calcified disc/disc osteophyte protrusion combined
with moderate facet hypertrophy and ligament flavum calcification
at L1-L2 results in spinal stenosis which is stable since 1555 (AP
thecal sac 9 - 9.5 mm).
2.  Disc osteophyte complex and severe facet hypertrophy at L4-L5
result in severe right L4 neural foraminal stenosis and moderate
right lateral recess stenosis. These appear stable since [DATE].  Mild to moderate left L2 and bilateral L5 neural foraminal
stenosis appears stable since 1555.  The extraforaminal left L2
nerve root may be abutted by disc osteophyte complex.

## 2010-03-24 ENCOUNTER — Ambulatory Visit: Payer: Self-pay | Admitting: Family Medicine

## 2010-03-31 IMAGING — CR DG CHEST 2V
2 series · 2 of 2 positions shown · non-contrast
Comparison: No priors

CLINICAL DATA: Preop for lumbar stenosis

CHEST - 2 VIEW

[view not recorded (1 of 2)]
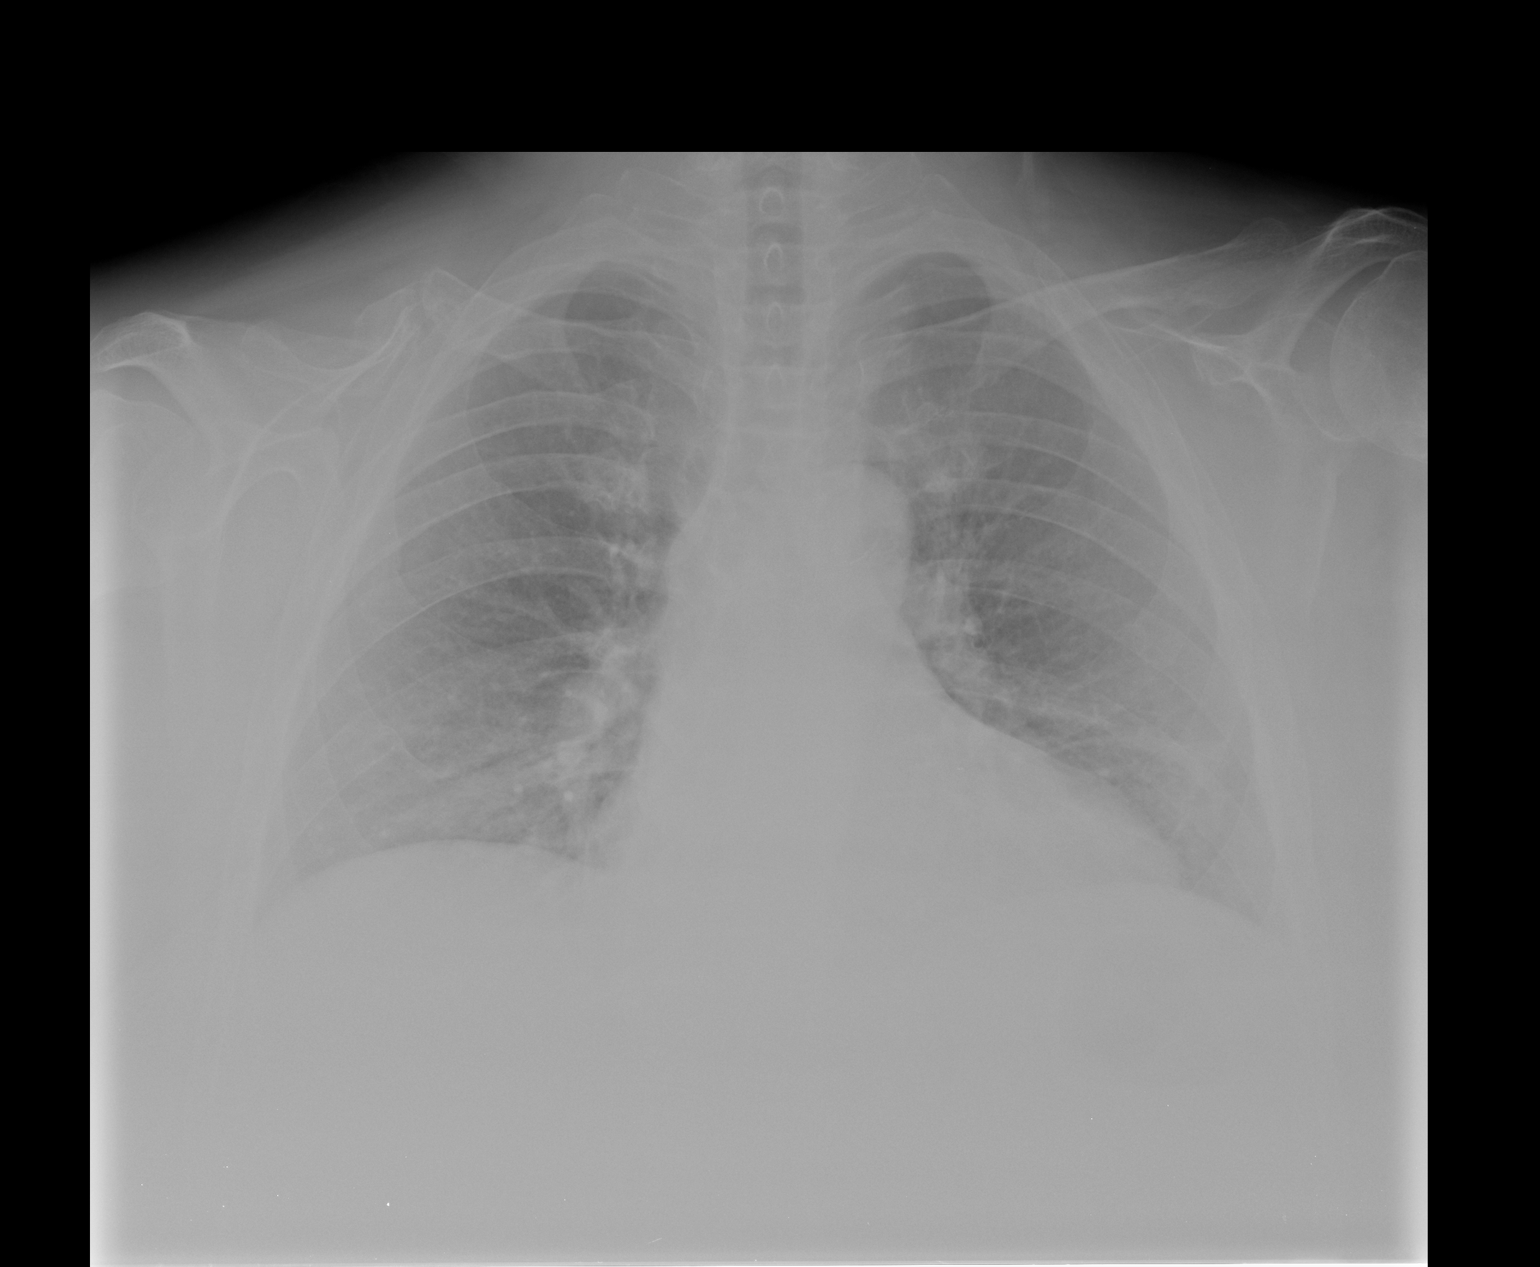

[view not recorded (2 of 2)]
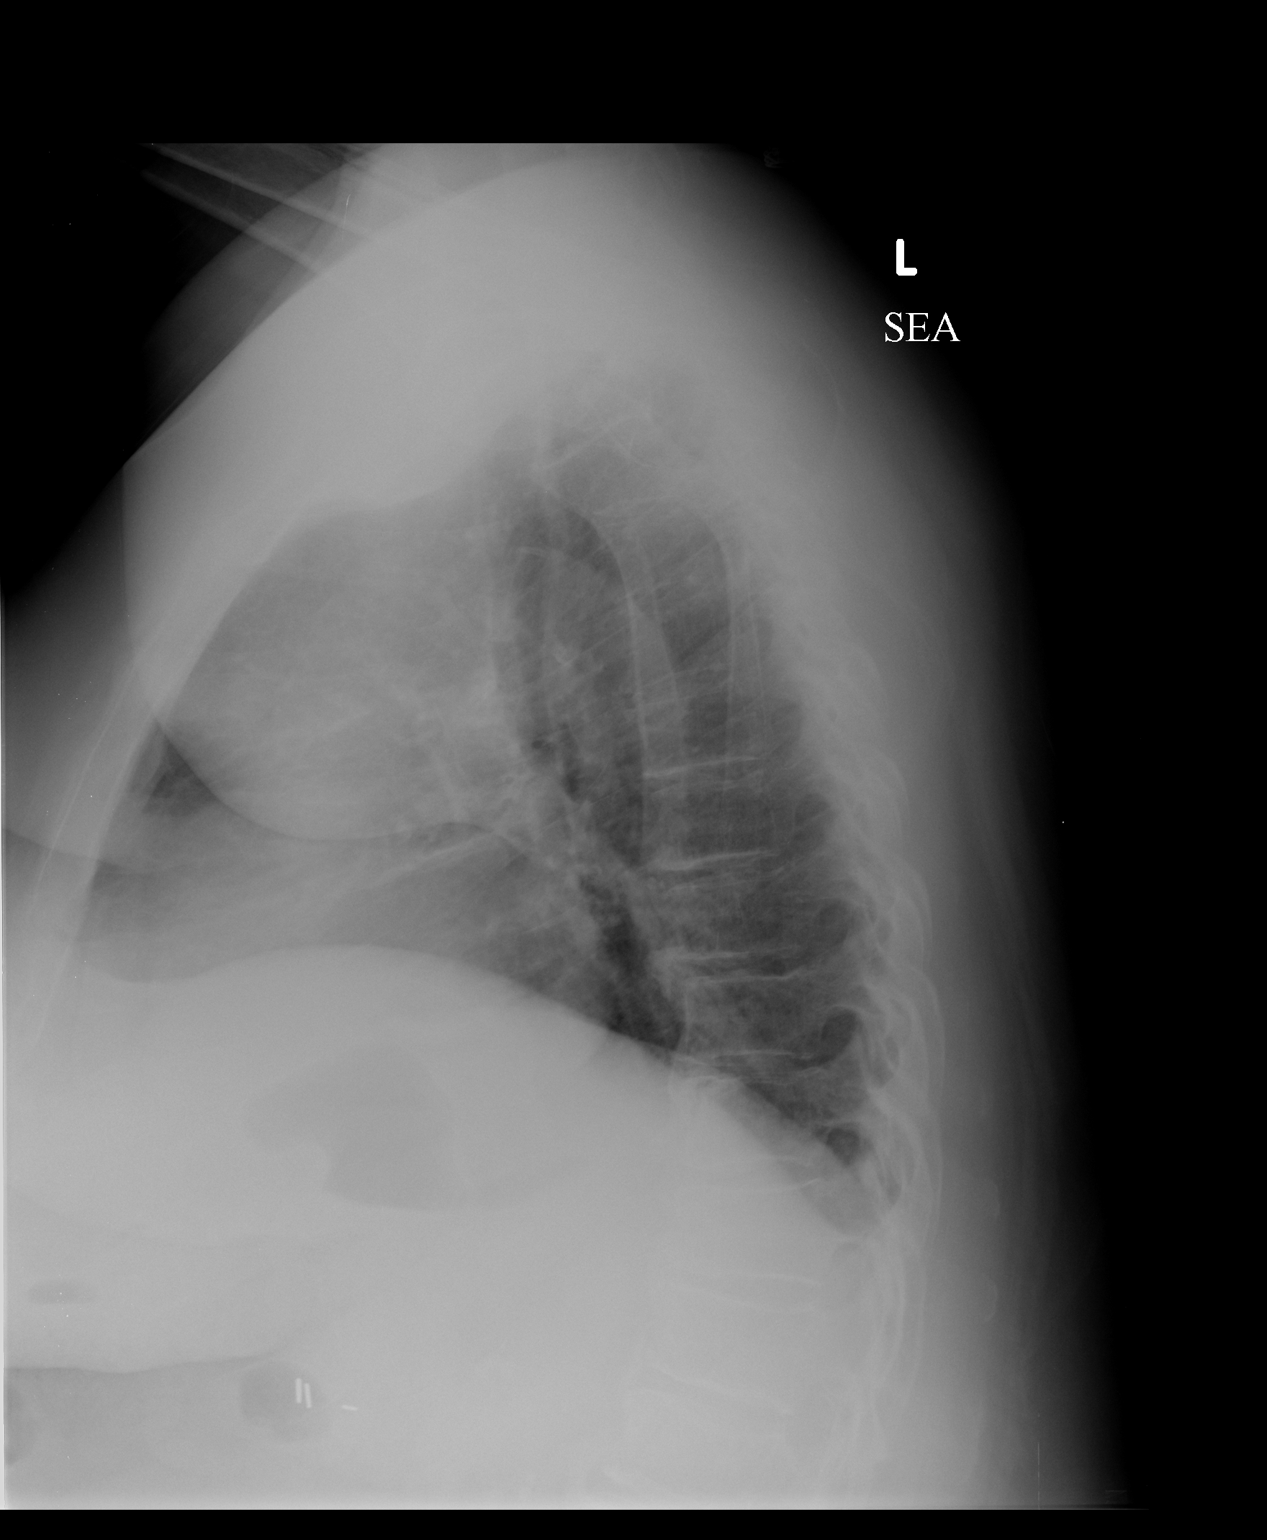

[2 of 2 positions shown; findings below may reference images not displayed]

FINDINGS: The heart is mildly enlarged.  There is no congestive
heart failure or active pulmonary disease.  Osseous structures
intact.There appears to be an old fracture or surgical resection of
the right distal clavicle.
IMPRESSION: 1.  Mild cardiomegaly - no frank congestive heart failure or active
disease.
2.  Post-traumatic or postsurgical changes of the right clavicle.

## 2010-04-03 IMAGING — RF DG LUMBAR SPINE 2-3V
1 series · 2 of 2 positions shown · non-contrast
Comparison: Plain film 06/04/2008

CLINICAL DATA: Posterior lateral interbody fusion

LUMBAR SPINE - 2-3 VIEW

[Series 1: run · 2 of 2 slices shown]
[im 1/2]
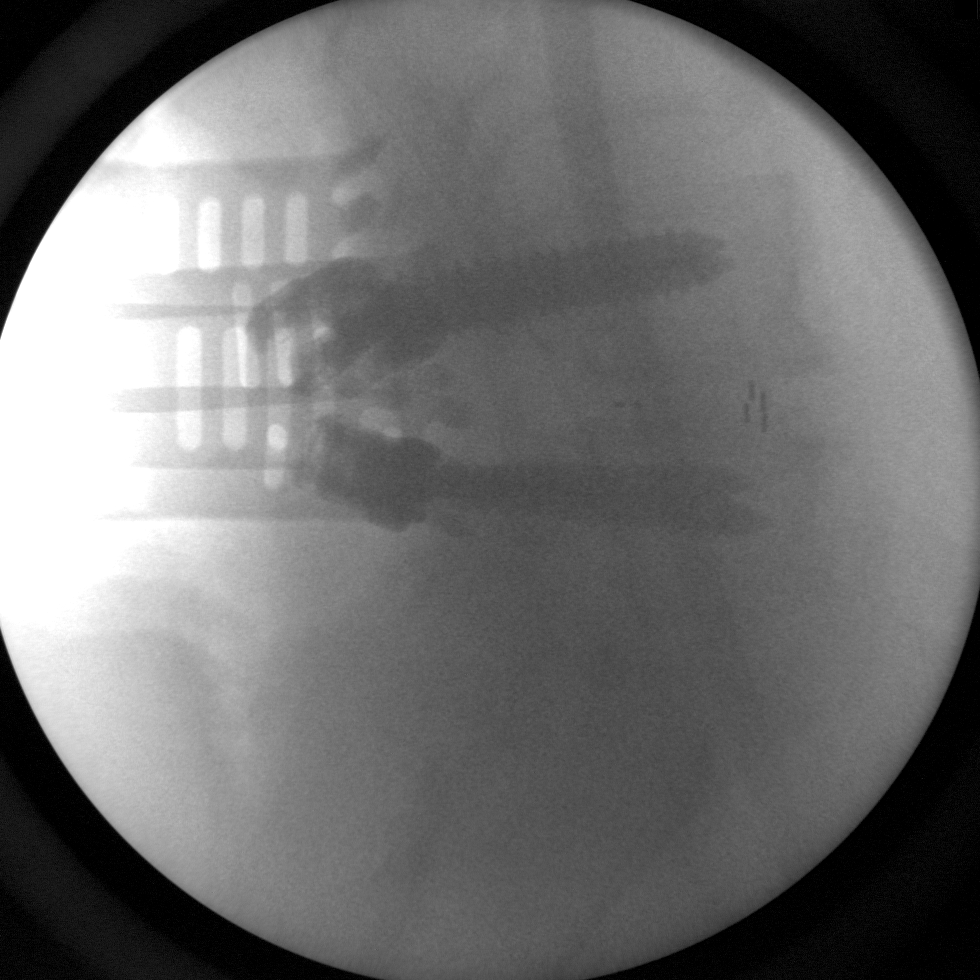
[im 2/2]
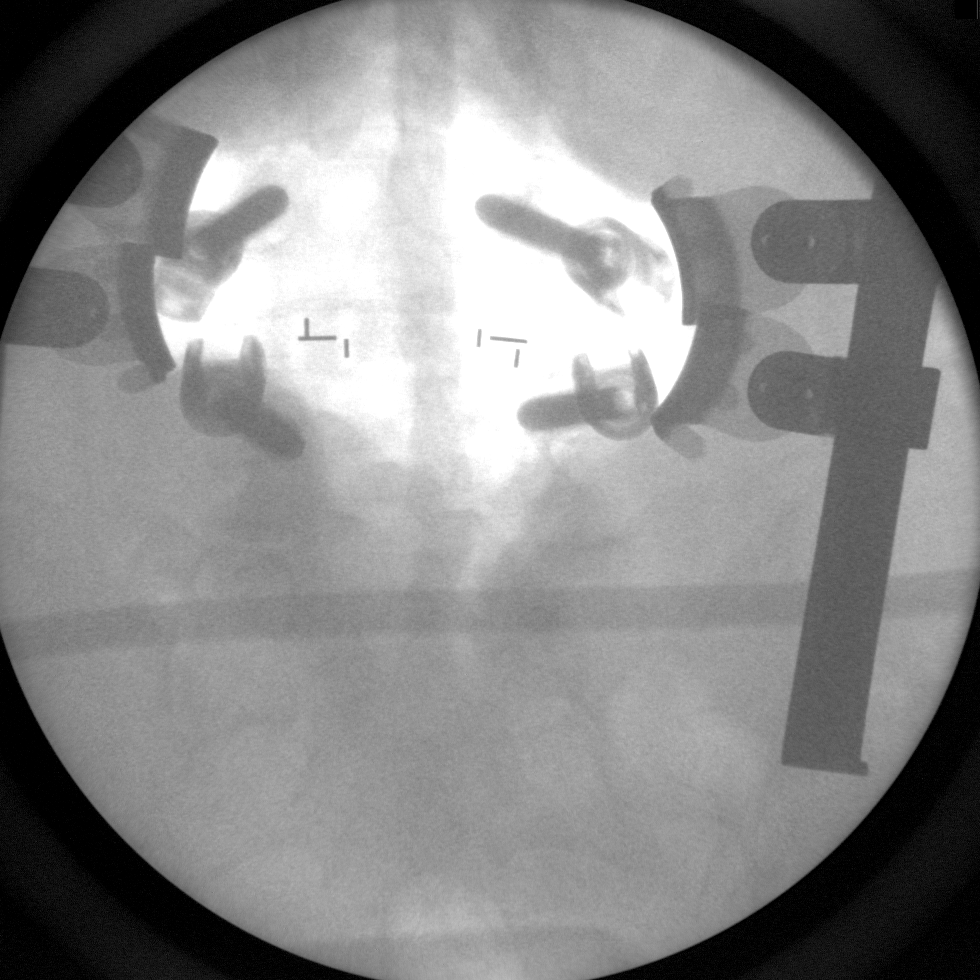

[2 of 2 positions shown; findings below may reference images not displayed]

FINDINGS: Posterior  lumbar fusion with pedicle screws at L4 and
L5.
IMPRESSION:

## 2010-04-03 IMAGING — CR DG LUMBAR SPINE 2-3V
1 series · 1 of 1 positions shown · non-contrast
Comparison: CT scan 04/21/2008

CLINICAL DATA: L4-5 laminectomy and PLIF

LUMBAR SPINE - 2-3 VIEW

[view not recorded]
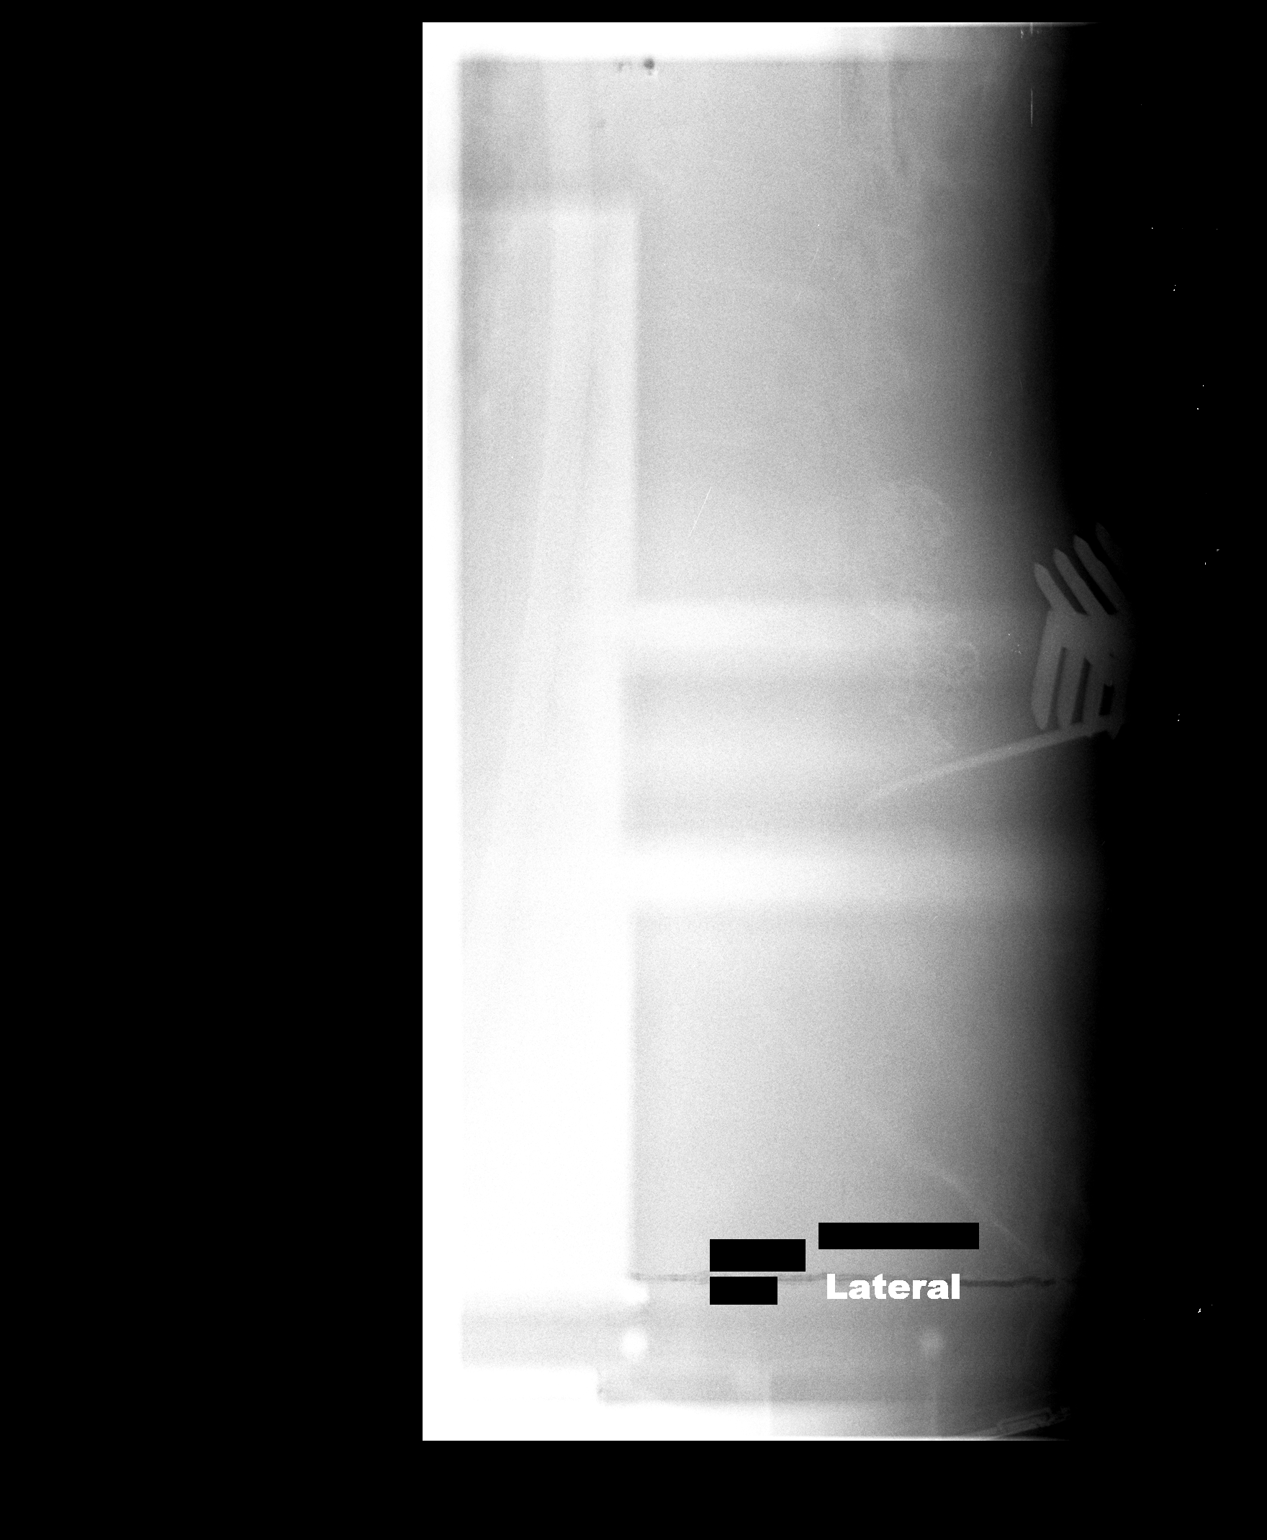

[1 of 1 positions shown; findings below may reference images not displayed]

FINDINGS: The films are markedly under penetrated.  It is difficult
to be sure of the exact location of the markers.  Based on the
sacral anatomy, the initial film is probably at the spinous process
of L4.  The second film is even worse from a technical standpoint.
There is a probe in place that may be between the spinous processes
of L5 and S1.  This is not reliable data.
IMPRESSION: Limited evaluation.  See above.

## 2010-04-03 IMAGING — RF DG OR PORTABLE SPINE
1 series · 1 of 1 positions shown · non-contrast
Comparison: CT 04/21/2008

CLINICAL DATA: Posterior lumbar fixation

PORTABLE SPINE

[Series 1: run · 1 of 1 slices shown]
[im 1/1]
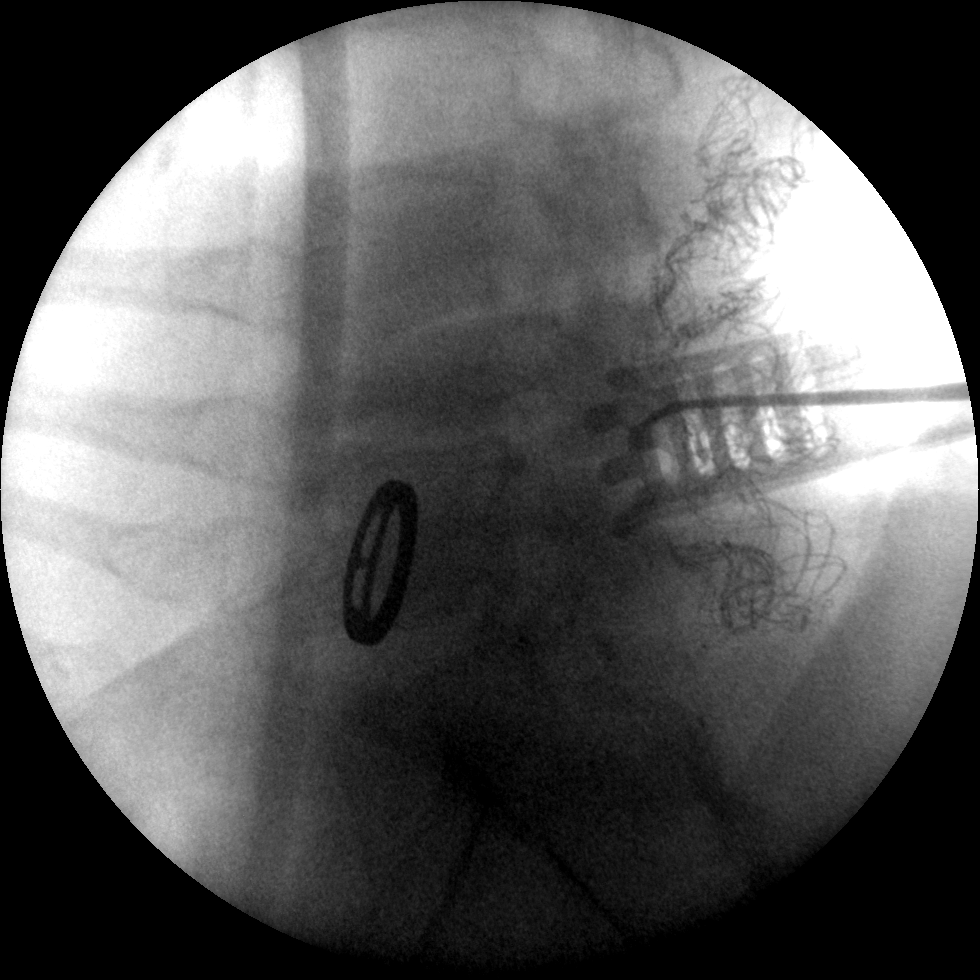

[1 of 1 positions shown; findings below may reference images not displayed]

FINDINGS: Using the CT for comparison, the skin spreaders are posterior to
the L5 vertebral body and the surgical probe is at the L4-L5 disc
space.
IMPRESSION: Intraoperative view of the lumbar spine as described above.

## 2010-04-17 IMAGING — CR DG CHEST 1V PORT
1 series · 1 of 1 positions shown · non-contrast
Comparison: none

REASON FOR EXAM: vomiting with AMS
COMMENTS:   LMP: Post-Menopausal

[view not recorded]
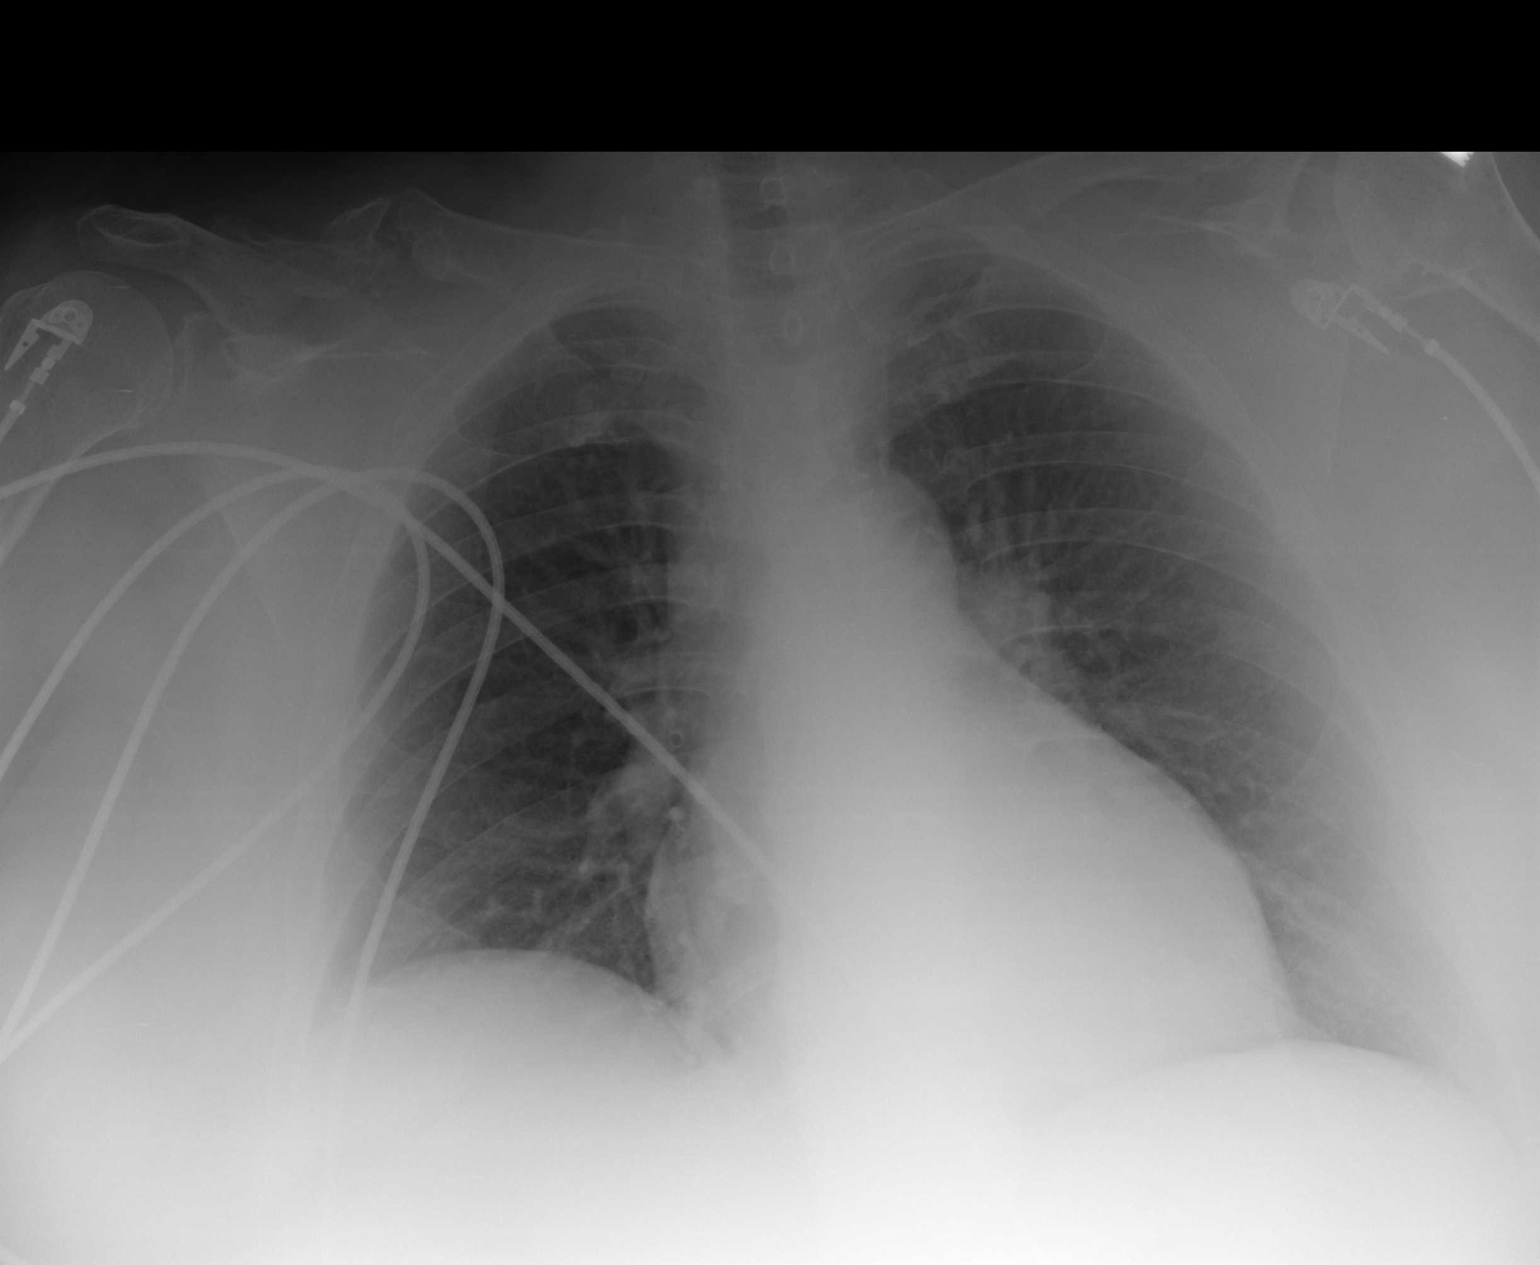

[1 of 1 positions shown; findings below may reference images not displayed]

PROCEDURE:     DXR - DXR PORTABLE CHEST SINGLE VIEW  - June 18, 2008 [DATE]

RESULT:     Comparison is made to the prior exam of 03/19/2007. The lung
fields are clear. No pneumonia, pneumothorax or pleural effusion is seen.
Heart size is normal. There is again noted deformity of the lateral aspect
of the RIGHT clavicle compatible with prior clavicle fracture with fibrous
union.
IMPRESSION: No acute changes are identified.

## 2010-04-17 IMAGING — CT CT HEAD WITHOUT CONTRAST
2 series · 16 of 30 positions shown, 20 images · non-contrast
Comparison: none

REASON FOR EXAM: AMS
COMMENTS:   LMP: Post-Menopausal

[Series 2: without · axial · non-contrast · 0.41mm/px · z∈[+704,+839]mm · 13 of 33 slices shown, 17 images]
[im 3/33  brain]
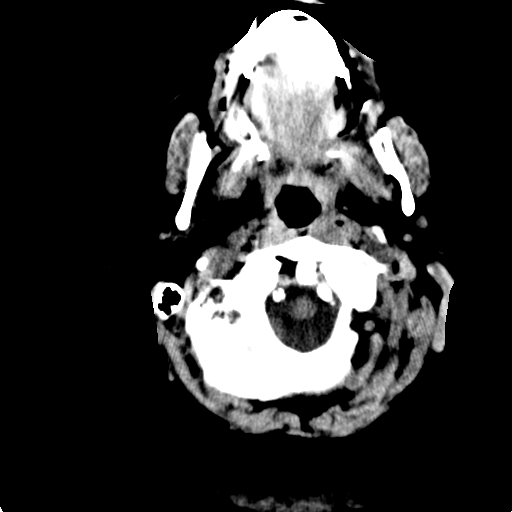
[im 3/33  bone]
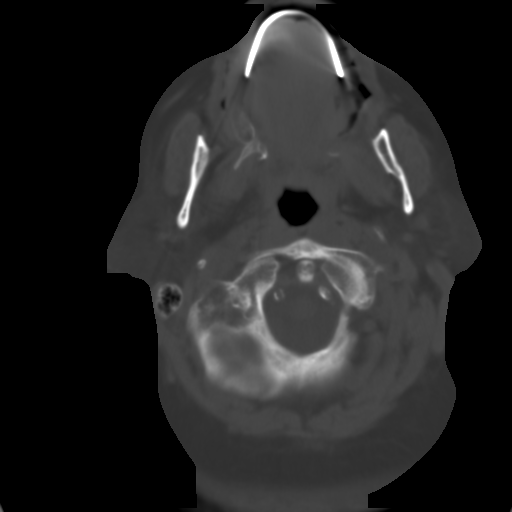
[im 5/33  brain]
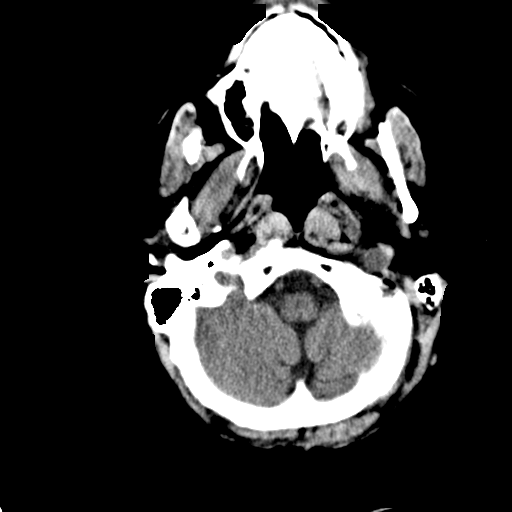
[im 7/33  brain]
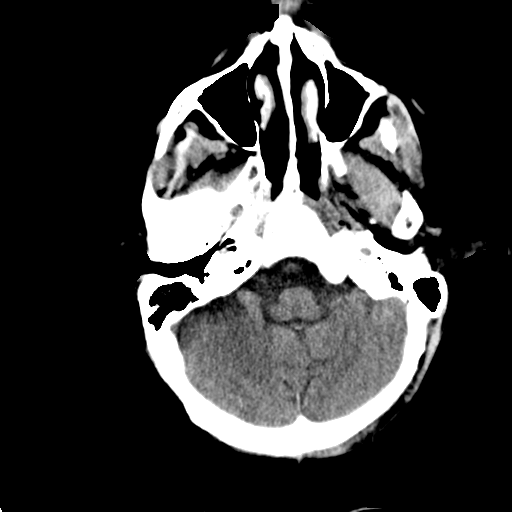
[im 10/33  brain]
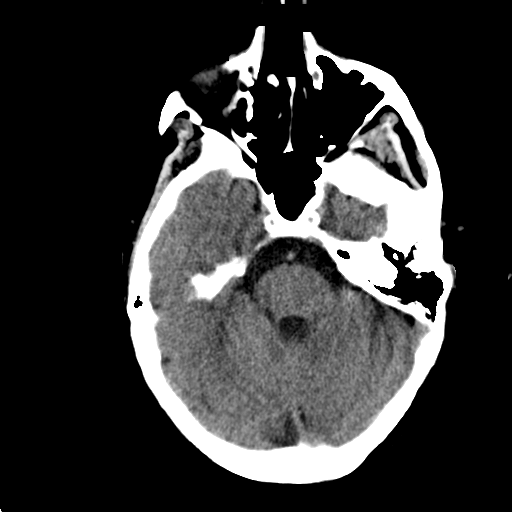
[im 12/33  brain]
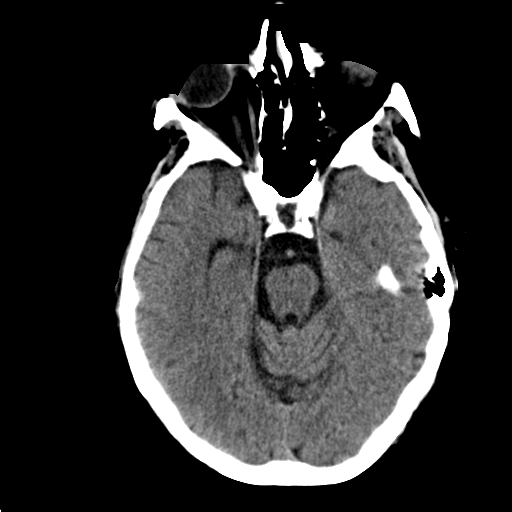
[im 12/33  bone]
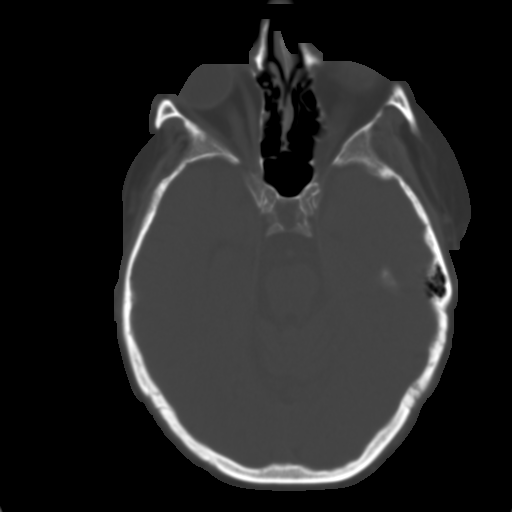
[im 14/33  brain]
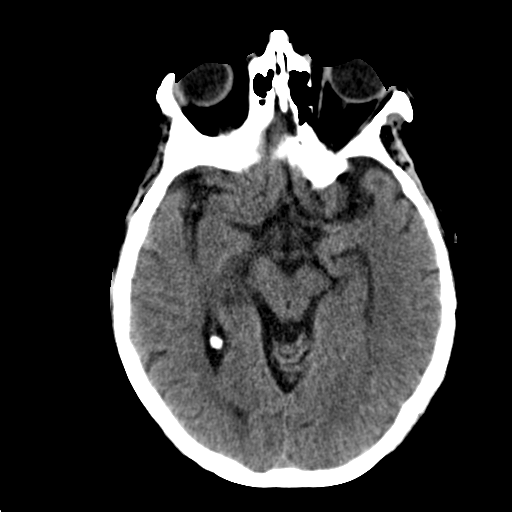
[im 17/33  brain]
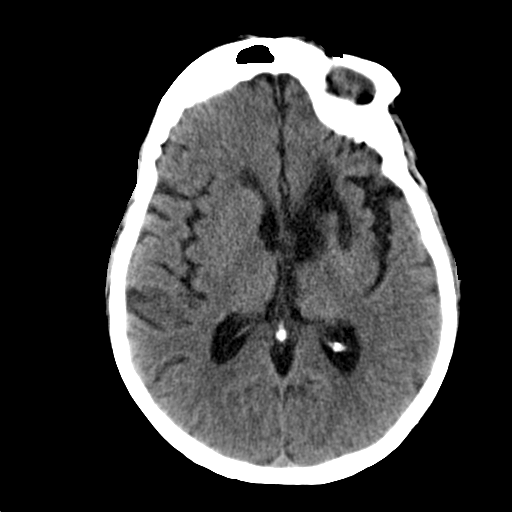
[im 19/33  brain]
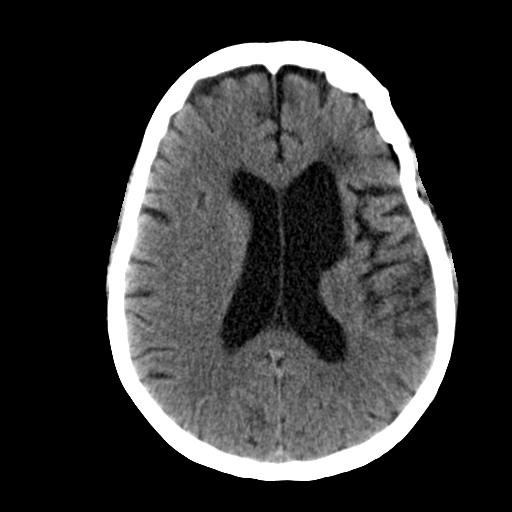
[im 21/33  brain]
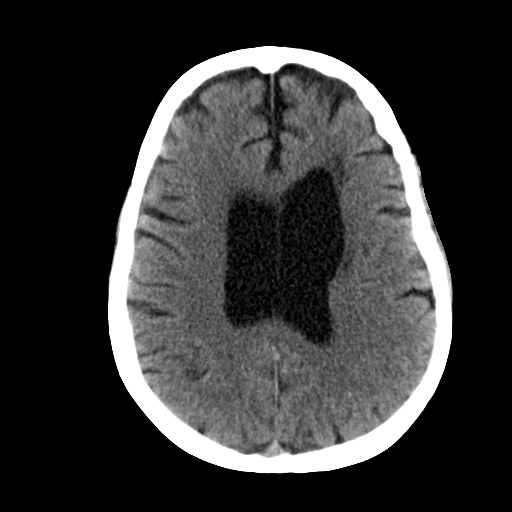
[im 21/33  bone]
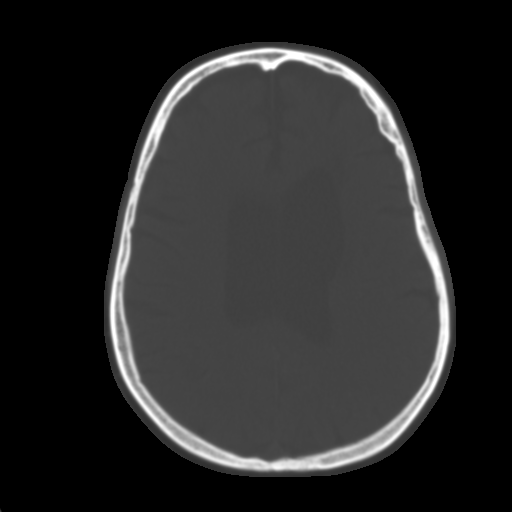
[im 23/33  brain]
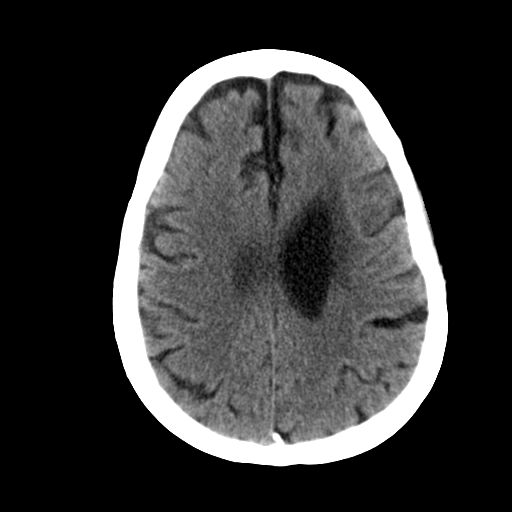
[im 26/33  brain]
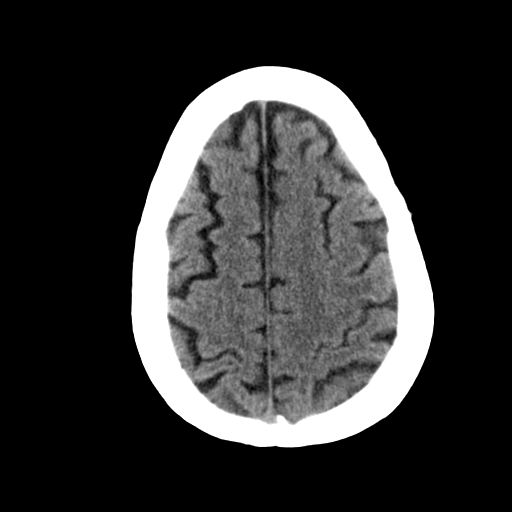
[im 28/33  brain]
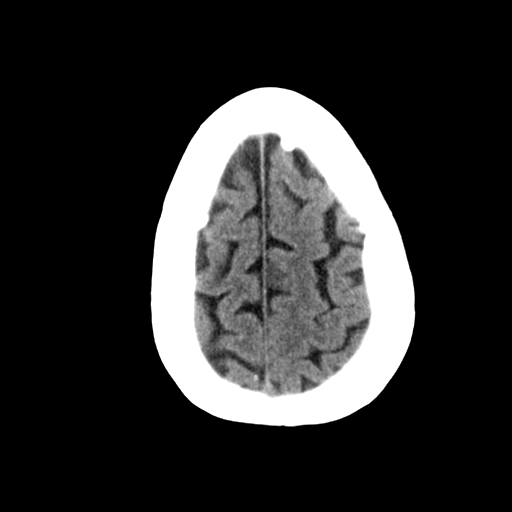
[im 30/33  brain]
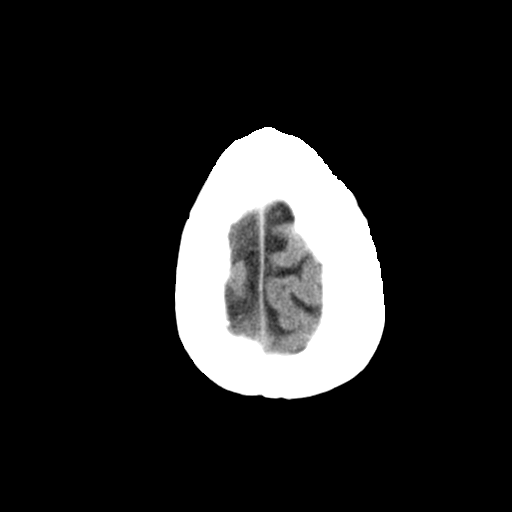
[im 30/33  bone]
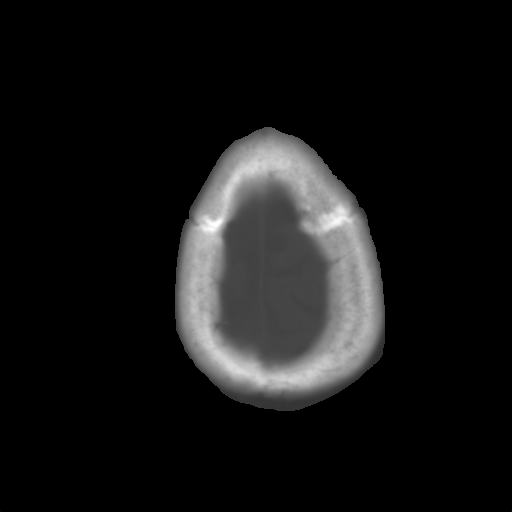

[Series 3: bone · axial · 0.41mm/px · z∈[+704,+749]mm · 3 of 33 slices shown]
[im 3/33  bone]
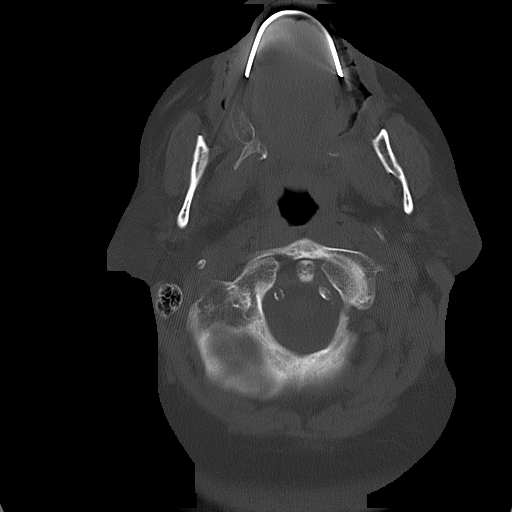
[im 7/33  bone]
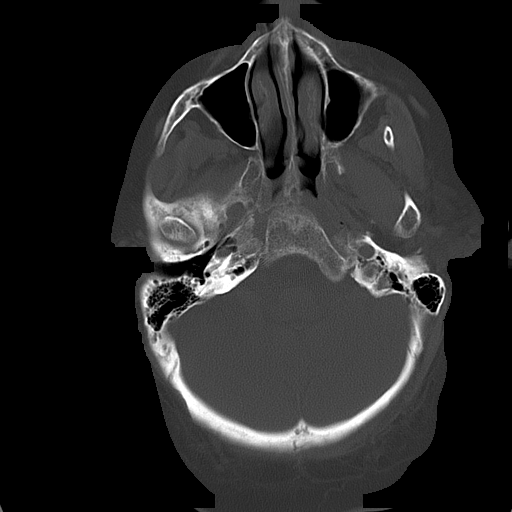
[im 12/33  bone]
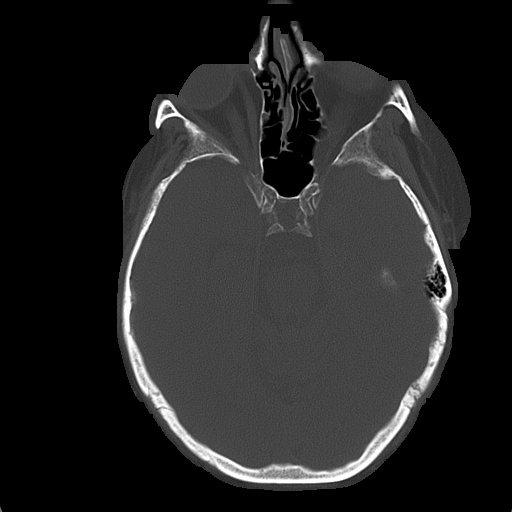

[16 of 30 positions shown; findings below may reference images not displayed]

PROCEDURE:     CT  - CT HEAD WITHOUT CONTRAST  - June 18, 2008  [DATE]

RESULT:     Noncontrast emergent CT of the brain is compared to the previous
exam dated 08/18/2007 and to an examination performed on 03/19/2007.

As noted previously there is prominence of the ventricles and sulci
consistent with atrophy. Ischemic changes are seen in the LEFT parietal and
frontal regions and in the basal ganglia. No new territorial infarct is
present. There is no hemorrhage, mass-effect or midline shift. The bone
window images demonstrate normal aeration of the sinuses and mastoids
included on the exam. The calvarium is intact.
IMPRESSION: Stable CT of the brain with atrophy and chronic microvascular ischemic
disease.

## 2010-05-21 ENCOUNTER — Emergency Department: Payer: Self-pay | Admitting: Unknown Physician Specialty

## 2010-10-15 IMAGING — MR MR HEAD WO/W CM
9 of 11 series · 24 of 48 positions shown · IV contrast (multihance)
Comparison: None available.

CLINICAL DATA: Memory changes.  Inappropriate speech.
Forgetfulness.  Confusion.

MRI HEAD WITHOUT AND WITH CONTRAST
TECHNIQUE: Multiplanar, multiecho pulse sequences of the brain and
surrounding structures were obtained according to standard protocol
without and with intravenous contrast
Contrast: 10 ml Multihance

[Series 1: loc · axial · 10.0mm · 0.94mm/px · 1 of 9 slices shown]
[im 1/9]
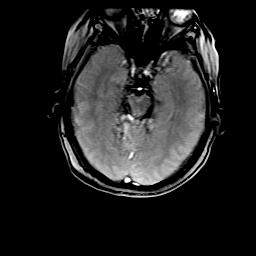

[Series 3: FLAIR · sagittal · 5.0mm · 0.47mm/px · 1 of 25 slices shown (1 of 2)]
[im 1/25]
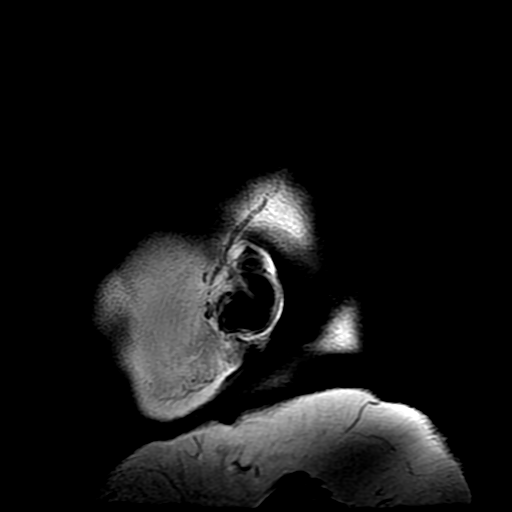

[Series 4: DWI · axial · 5.0mm · 1.09mm/px · z∈[-72,+98]mm · 6 of 63 slices shown (1 of 2)]
[im 1/63]
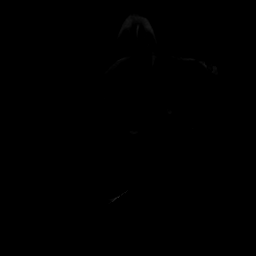
[im 13/63]
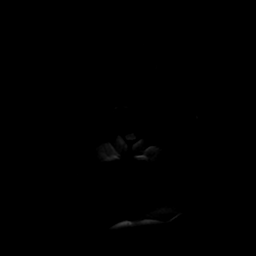
[im 25/63]
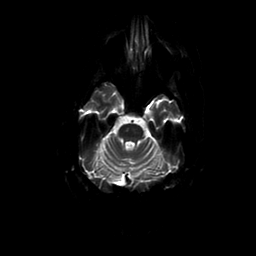
[im 38/63]
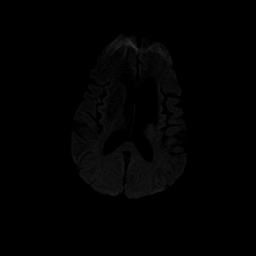
[im 50/63]
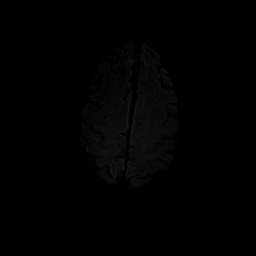
[im 63/63]
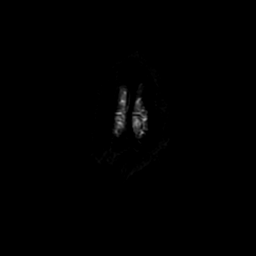

[Series 5: (id) · axial · 5.0mm · 0.43mm/px · z∈[-65,+85]mm · 2 of 26 slices shown]
[im 1/26]
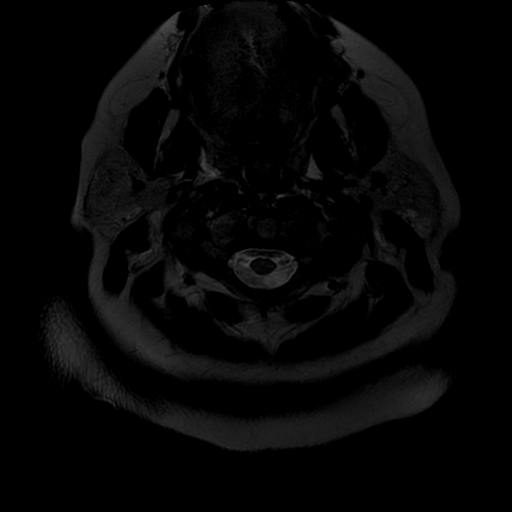
[im 26/26]
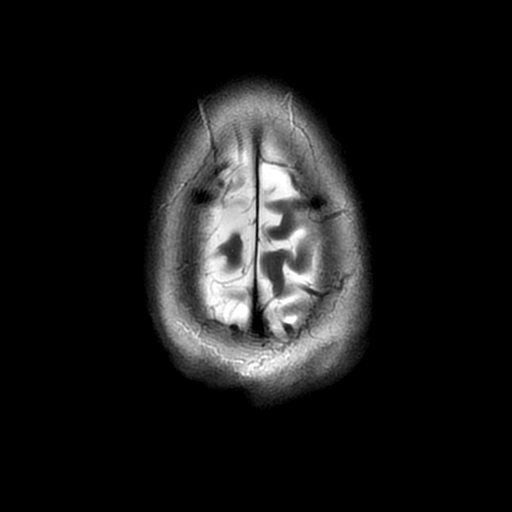

[Series 6: T2-star · axial · 5.0mm · 0.43mm/px · z∈[-65,+85]mm · 2 of 26 slices shown]
[im 1/26]
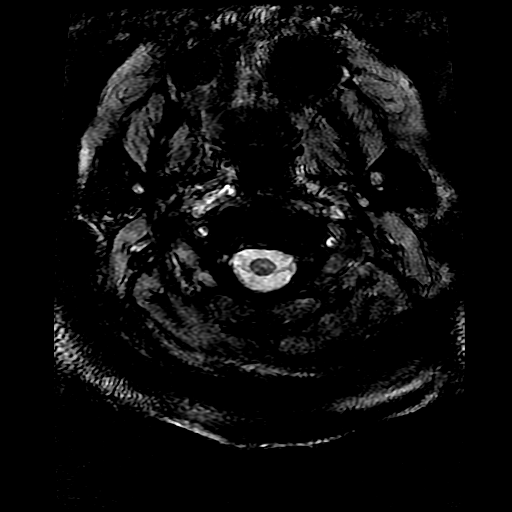
[im 26/26]
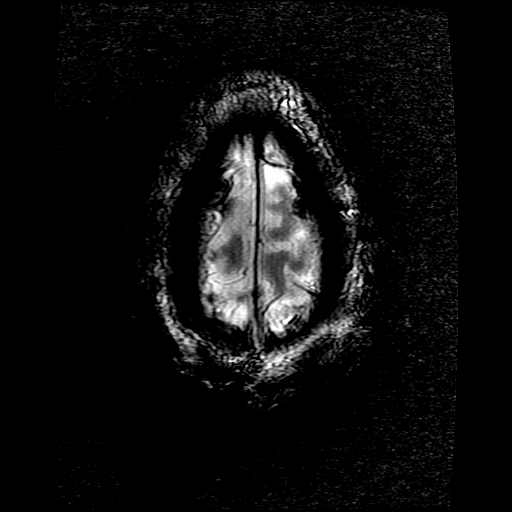

[Series 7: FLAIR · axial · 5.0mm · 0.43mm/px · z∈[-73,+89]mm · 3 of 28 slices shown (2 of 2)]
[im 1/28]
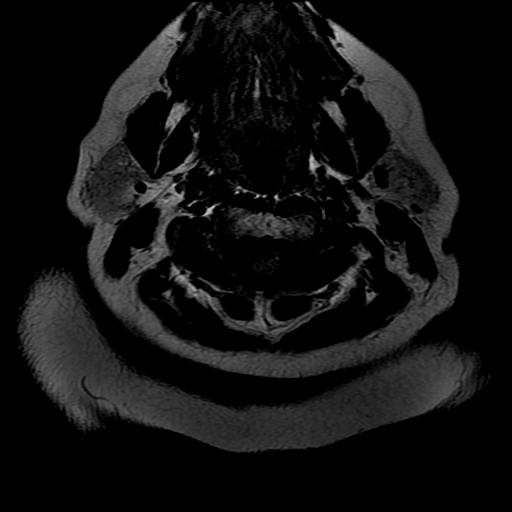
[im 14/28]
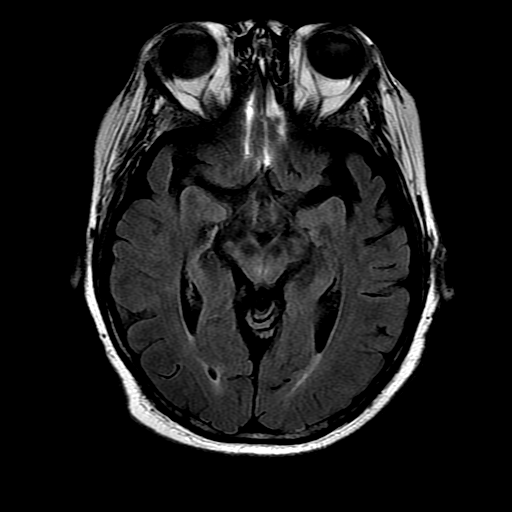
[im 28/28]
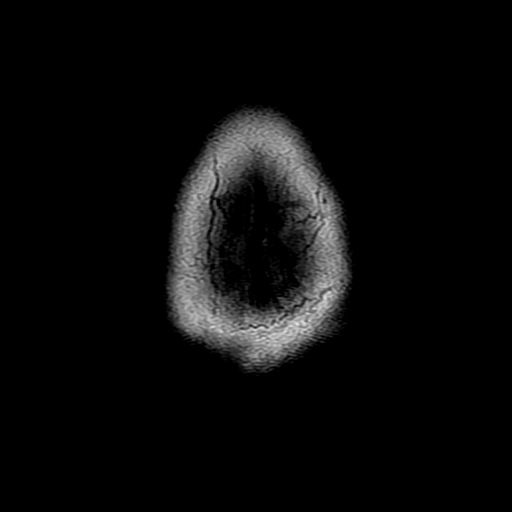

[Series 9: T2 post-contrast · coronal · 5.0mm · 0.45mm/px · 3 of 28 slices shown]
[im 1/28]
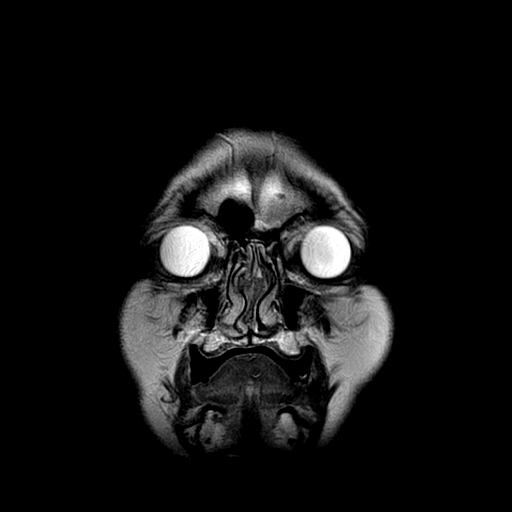
[im 14/28]
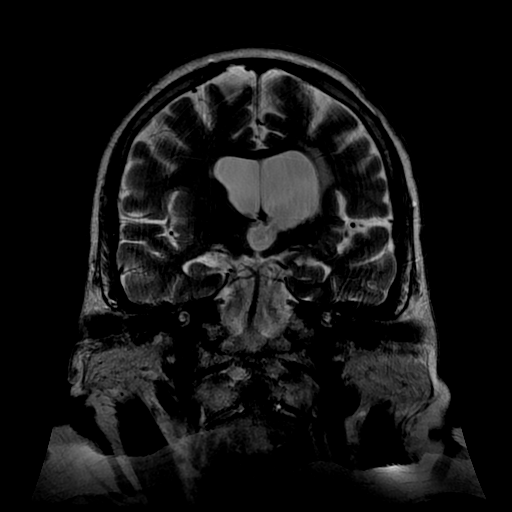
[im 28/28]
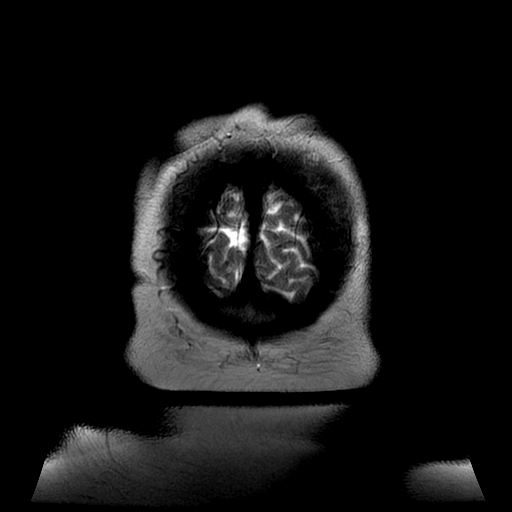

[Series 11: T1 · coronal · 5.0mm · 0.45mm/px · 3 of 29 slices shown]
[im 1/29]
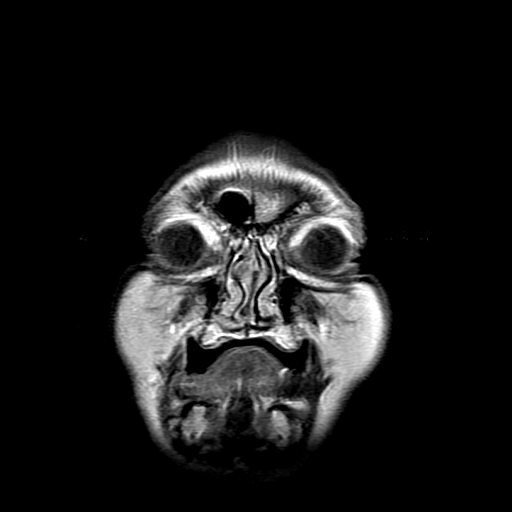
[im 15/29]
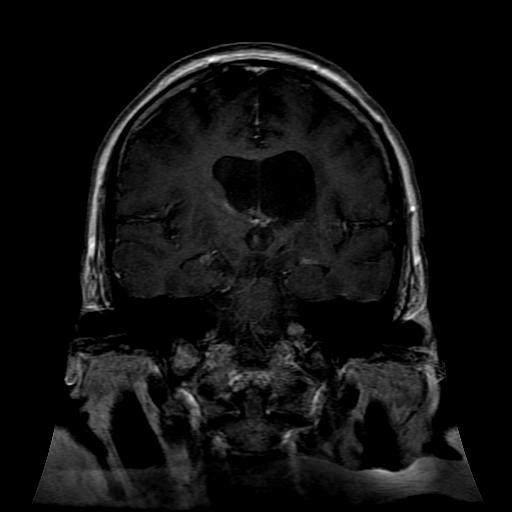
[im 29/29]
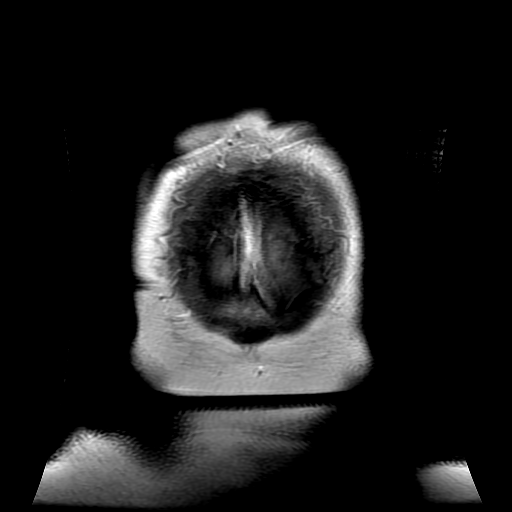

[Series 400: DWI · axial · 5.0mm · 1.09mm/px · z∈[-72,+98]mm · 3 of 32 slices shown (2 of 2)]
[im 1/32]
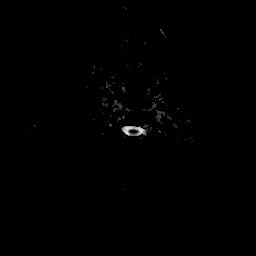
[im 16/32]
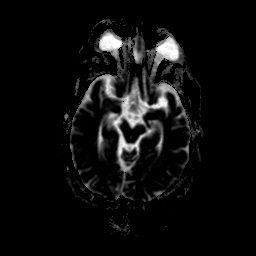
[im 32/32]
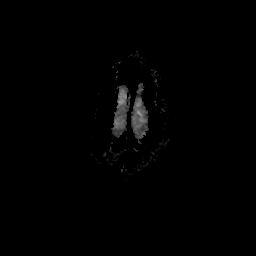

[24 of 48 positions shown; findings below may reference images not displayed]

FINDINGS: There is a focal diffusion abnormality within the high
right parietal lobe near the vertex.  ADC map confirms the finding.
There is no significant edema on T2 or FLAIR weighted images
associated with this area.  The patient does have a history of a
remote left basal ganglia infarct with ex vacuo dilation of the
lateral ventricle.  White matter changes are worse left than right,
associated with remote infarct.  There is no evidence for acute
hemorrhage or mass lesion.  White matter changes are present in the
cortical spinal tracts of the pons.  Flow is present in the major
intracranial arteries.  The paranasal sinuses and mastoid air cells
are clear.

Post contrast images demonstrate no areas of pathologic
enhancement.
IMPRESSION: 1.  Focal area of diffusion signal abnormality in the high right
parietal lobe near the vertex worrisome for acute / subacute
infarct.   This lesion may be artifactual and is not necessarily
correlate with the given symptoms.
2.  Remote left basal ganglia infarct with ex vacuo dilation of the
left lateral ventricle.
3.  Mild asymmetry of white matter disease associated with the left
sided infarct.

Critical test results telephoned to Dr. Felner at the time of

## 2010-10-23 LAB — GLUCOSE, CAPILLARY: Glucose-Capillary: 125 mg/dL — ABNORMAL HIGH (ref 70–99)

## 2010-10-23 LAB — URINALYSIS, ROUTINE W REFLEX MICROSCOPIC
Bilirubin Urine: NEGATIVE
Glucose, UA: NEGATIVE mg/dL
Ketones, ur: NEGATIVE mg/dL
Nitrite: NEGATIVE
pH: 7 (ref 5.0–8.0)

## 2010-10-23 LAB — PROTIME-INR: Prothrombin Time: 14.7 seconds (ref 11.6–15.2)

## 2010-10-23 LAB — BASIC METABOLIC PANEL
CO2: 26 mEq/L (ref 19–32)
Calcium: 8.6 mg/dL (ref 8.4–10.5)
Chloride: 110 mEq/L (ref 96–112)
Creatinine, Ser: 1.13 mg/dL (ref 0.4–1.2)
Glucose, Bld: 145 mg/dL — ABNORMAL HIGH (ref 70–99)
Sodium: 141 mEq/L (ref 135–145)

## 2010-10-23 LAB — CBC
Hemoglobin: 11.7 g/dL — ABNORMAL LOW (ref 12.0–15.0)
MCHC: 33.9 g/dL (ref 30.0–36.0)
MCV: 89.4 fL (ref 78.0–100.0)
MCV: 89.4 fL (ref 78.0–100.0)
Platelets: 163 10*3/uL (ref 150–400)
RBC: 3.77 MIL/uL — ABNORMAL LOW (ref 3.87–5.11)
RDW: 14.5 % (ref 11.5–15.5)
WBC: 6.2 10*3/uL (ref 4.0–10.5)

## 2010-10-23 LAB — COMPREHENSIVE METABOLIC PANEL
AST: 27 U/L (ref 0–37)
AST: 29 U/L (ref 0–37)
Albumin: 3.3 g/dL — ABNORMAL LOW (ref 3.5–5.2)
BUN: 27 mg/dL — ABNORMAL HIGH (ref 6–23)
CO2: 27 mEq/L (ref 19–32)
Calcium: 9 mg/dL (ref 8.4–10.5)
Chloride: 105 mEq/L (ref 96–112)
Creatinine, Ser: 1.22 mg/dL — ABNORMAL HIGH (ref 0.4–1.2)
Creatinine, Ser: 1.25 mg/dL — ABNORMAL HIGH (ref 0.4–1.2)
GFR calc Af Amer: 54 mL/min — ABNORMAL LOW (ref >60)
GFR calc Af Amer: 56 mL/min — ABNORMAL LOW (ref >60)
Glucose, Bld: 112 mg/dL — ABNORMAL HIGH (ref 70–99)
Total Protein: 6.2 g/dL (ref 6.0–8.3)

## 2010-10-23 LAB — DIFFERENTIAL
Basophils Absolute: 0 10*3/uL (ref 0.0–0.1)
Basophils Relative: 0 % (ref 0–1)
Monocytes Absolute: 0.6 10*3/uL (ref 0.1–1.0)
Monocytes Relative: 10 % (ref 3–12)
Neutrophils Relative %: 51 % (ref 43–77)

## 2010-10-23 LAB — URINE CULTURE: Colony Count: 60000

## 2010-10-23 LAB — URINE MICROSCOPIC-ADD ON

## 2010-10-23 LAB — LIPID PANEL
HDL: 39 mg/dL — ABNORMAL LOW (ref >39)
Total CHOL/HDL Ratio: 3.7 RATIO
VLDL: 22 mg/dL (ref 0–40)

## 2010-10-23 LAB — T4, FREE: Free T4: 0.69 ng/dL — ABNORMAL LOW (ref 0.80–1.80)

## 2010-10-23 LAB — HEMOGLOBIN A1C: Hgb A1c MFr Bld: 6.6 % — ABNORMAL HIGH (ref 4.6–6.1)

## 2010-10-23 LAB — POCT CARDIAC MARKERS
CKMB, poc: <1 ng/mL — ABNORMAL LOW (ref 1.0–8.0)
CKMB, poc: <1 ng/mL — ABNORMAL LOW (ref 1.0–8.0)
Myoglobin, poc: 74.8 ng/mL (ref 12–200)
Myoglobin, poc: 92.2 ng/mL (ref 12–200)
Troponin i, poc: <0.05 ng/mL (ref 0.00–0.09)

## 2010-10-23 LAB — SEDIMENTATION RATE: Sed Rate: 22 mm/hr (ref 0–22)

## 2010-10-23 LAB — VITAMIN B12: Vitamin B-12: 551 pg/mL (ref 211–911)

## 2010-10-23 LAB — TSH: TSH: 1.034 u[IU]/mL (ref 0.350–4.500)

## 2010-10-23 LAB — APTT: aPTT: 31 seconds (ref 24–37)

## 2010-10-23 LAB — HOMOCYSTEINE: Homocysteine: 11.8 umol/L (ref 4.0–15.4)

## 2010-11-13 IMAGING — CR DG CHEST 1V PORT
1 series · 1 of 1 positions shown · non-contrast
Comparison: 06/01/2008

CLINICAL DATA: Dizziness

PORTABLE CHEST - 1 VIEW

[AP]
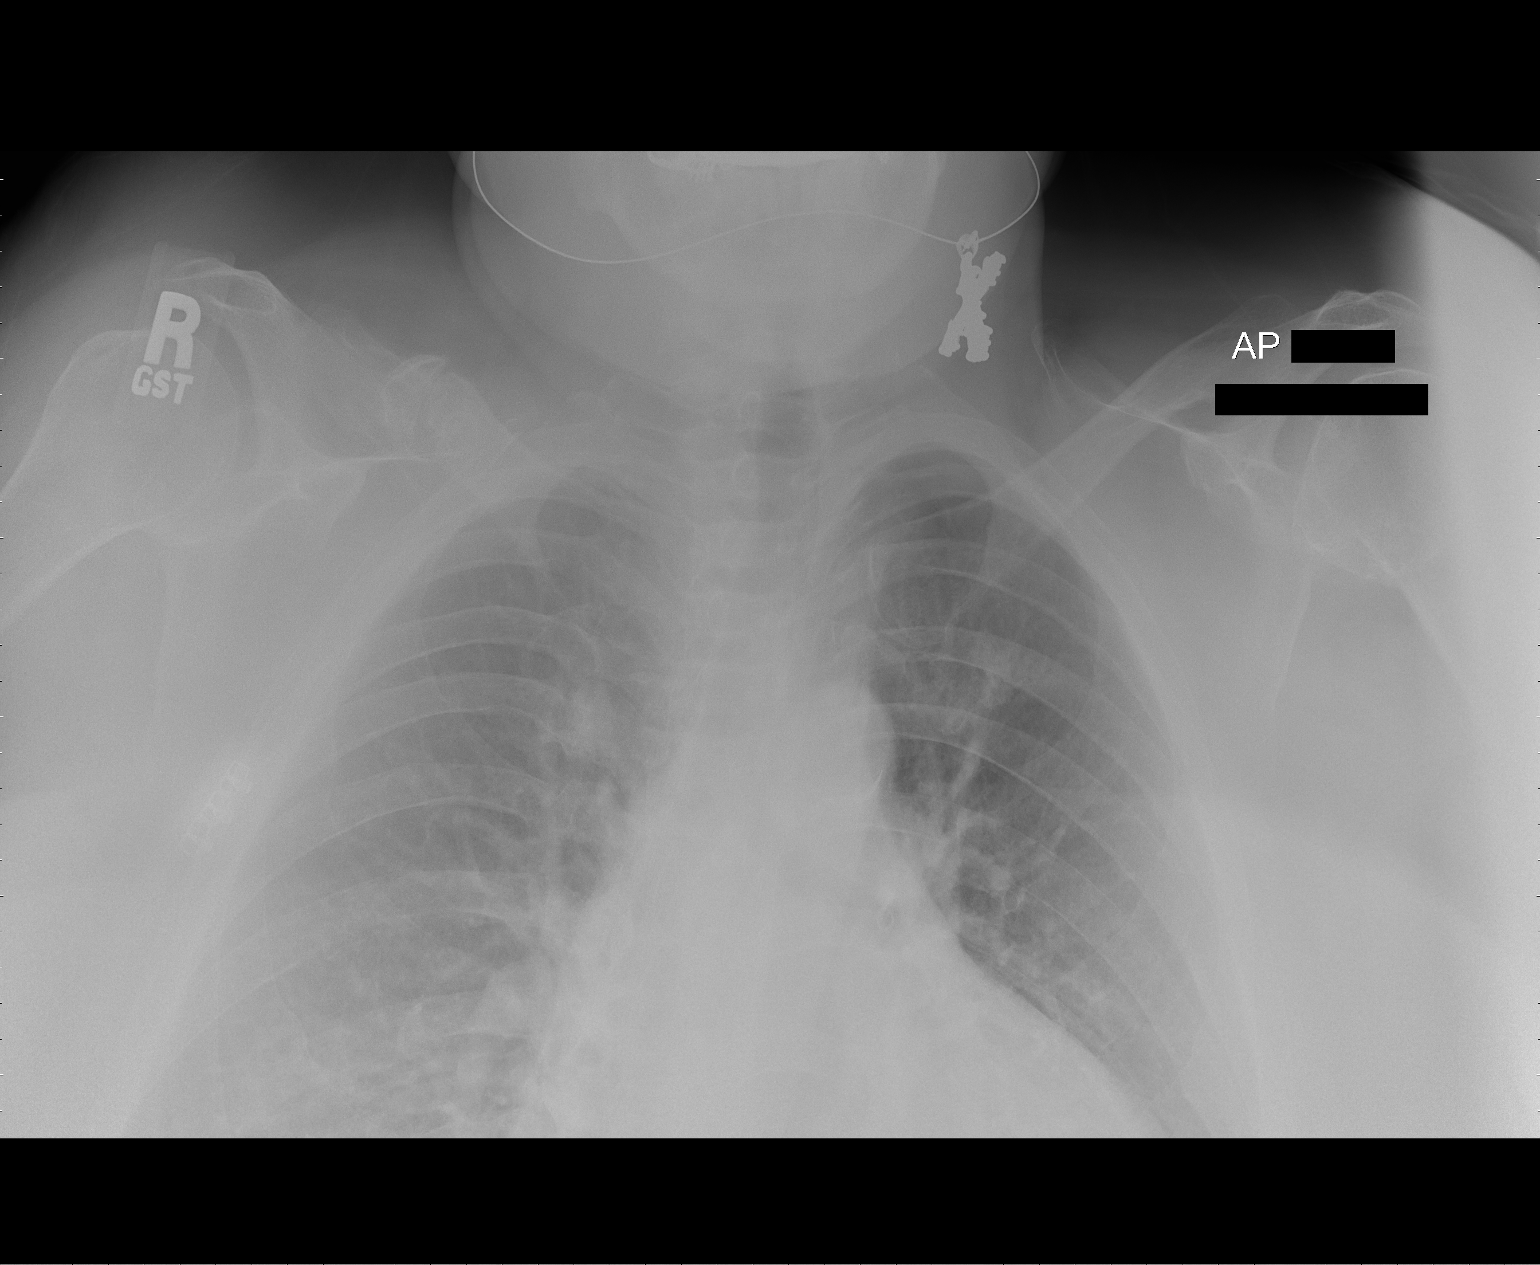

[1 of 1 positions shown; findings below may reference images not displayed]

FINDINGS: Old right clavicle fracture.  Stable cardiomegaly.
Coarse perihilar and bibasilar interstitial opacities as before
without confluent infiltrate or overt edema.  No definite effusion
although the lateral costophrenic angles are excluded.
IMPRESSION: 1.  Stable cardiomegaly and chronic interstitial prominence.
2.  No acute or superimposed abnormality.

## 2010-11-13 IMAGING — CT CT HEAD W/O CM
1 of 2 series · 16 of 30 positions shown, 20 images · non-contrast
Comparison: MR 12/16/2008

CLINICAL DATA: Headache, dizziness

CT HEAD WITHOUT CONTRAST
TECHNIQUE: Contiguous axial images were obtained from the base of
the skull through the vertex without contrast.

[Series 2: head routine 4.8 h37s · axial · 0.47mm/px · z∈[+1192,+1322]mm · 16 of 30 slices shown, 20 images]
[im 2/30  brain]
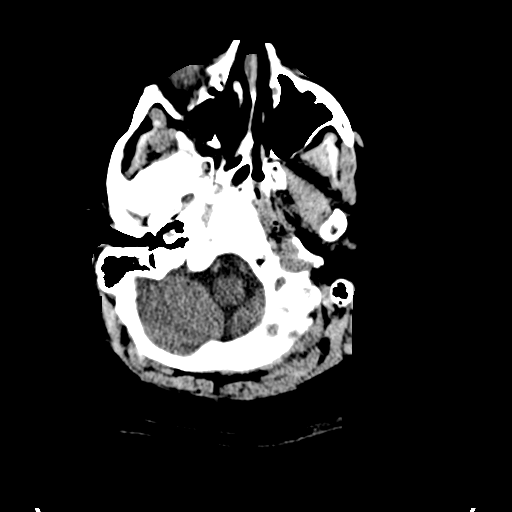
[im 2/30  bone]
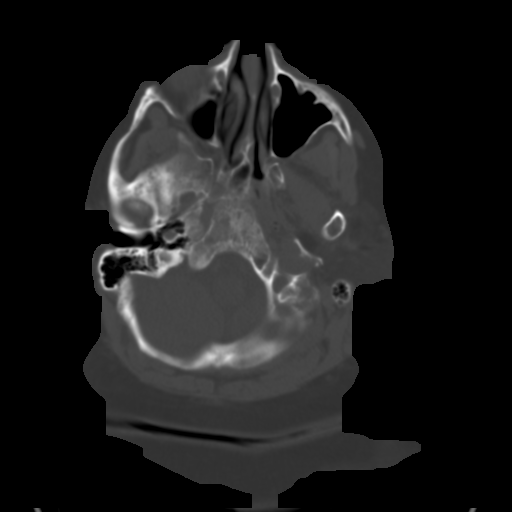
[im 4/30  brain]
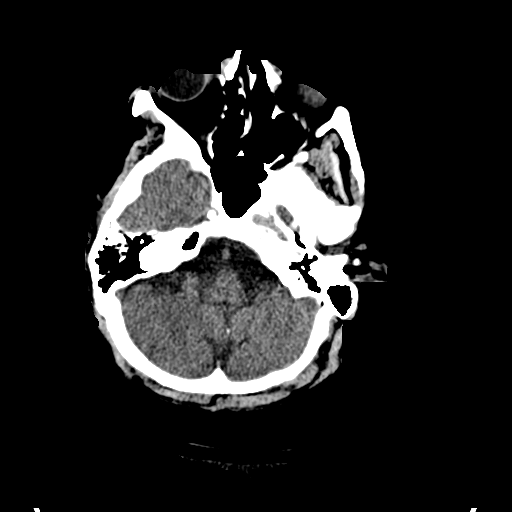
[im 5/30  brain]
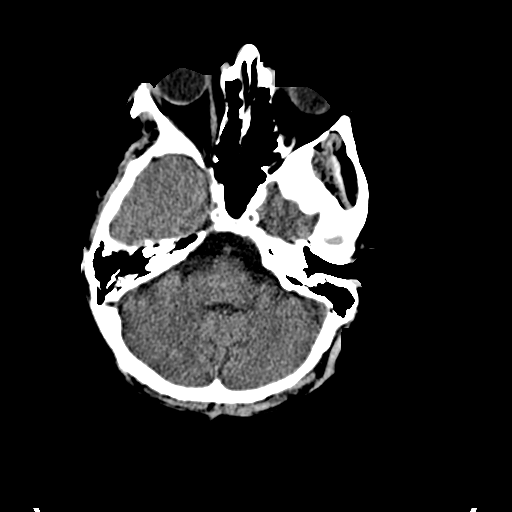
[im 8/30  brain]
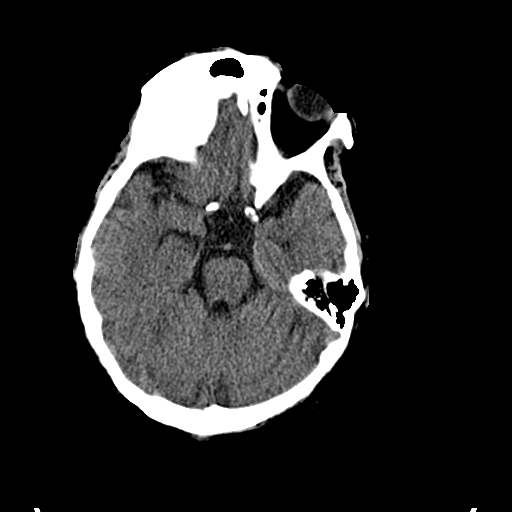
[im 9/30  brain]
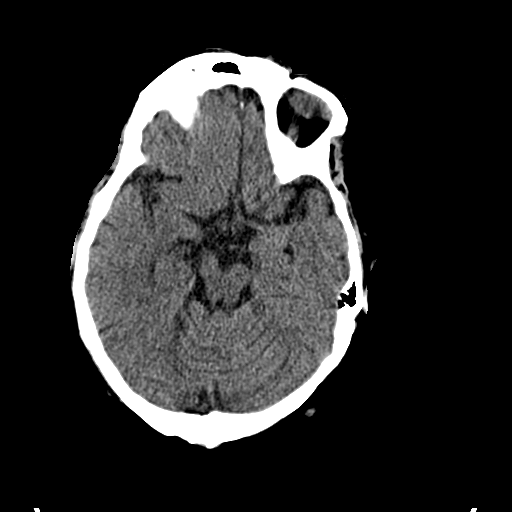
[im 9/30  bone]
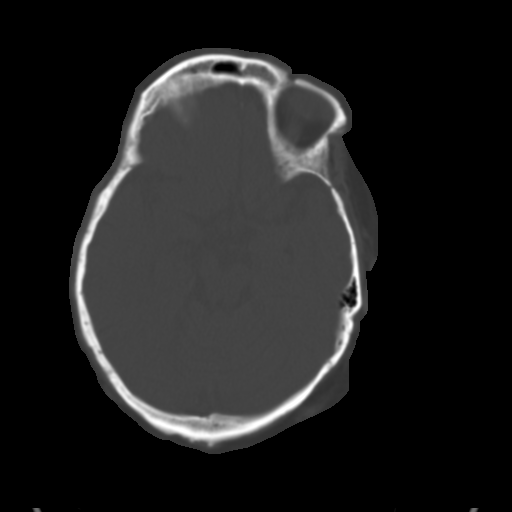
[im 10/30  brain]
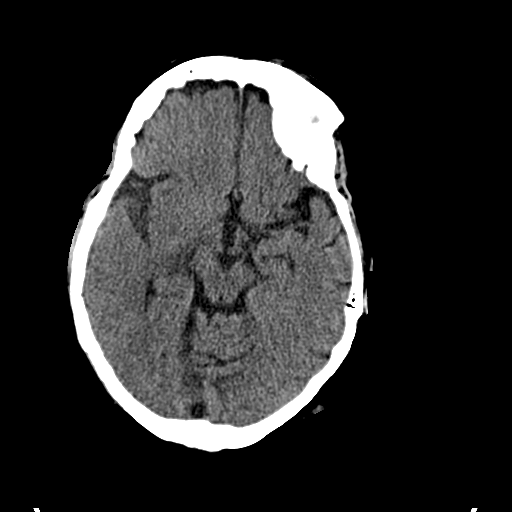
[im 13/30  brain]
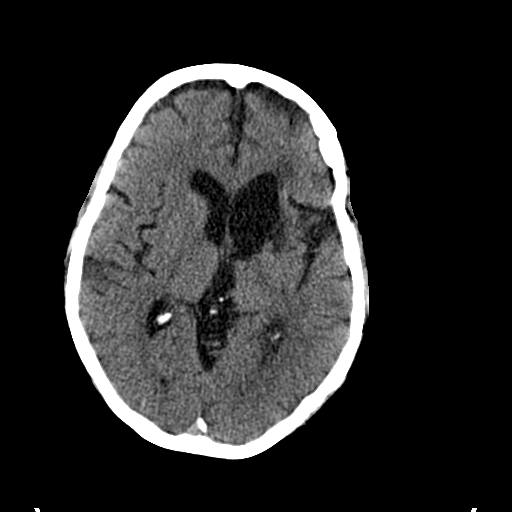
[im 14/30  brain]
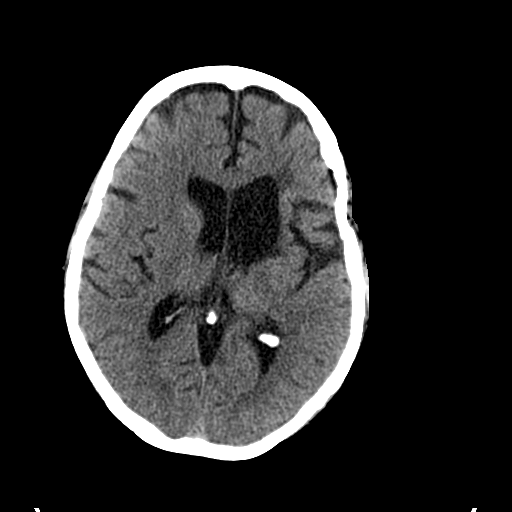
[im 16/30  brain]
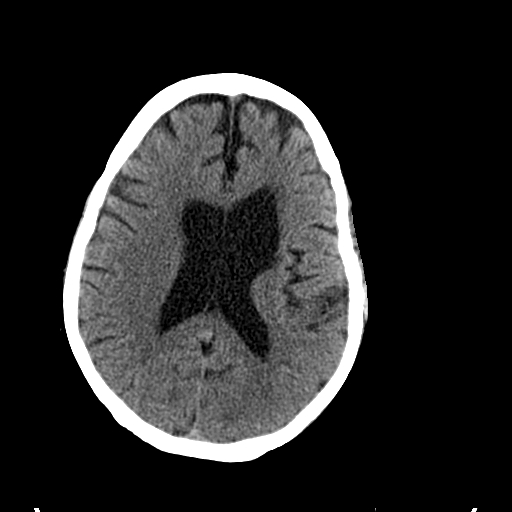
[im 16/30  bone]
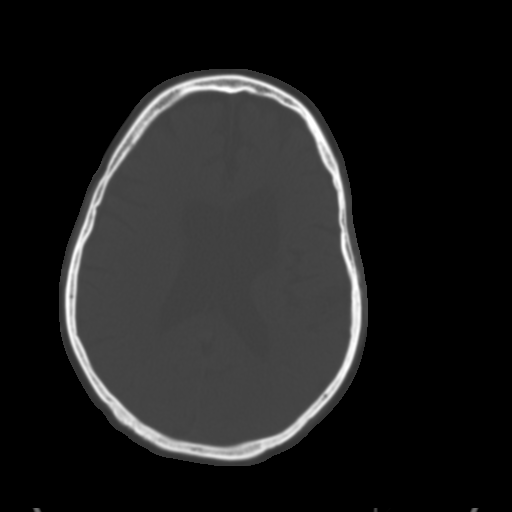
[im 17/30  brain]
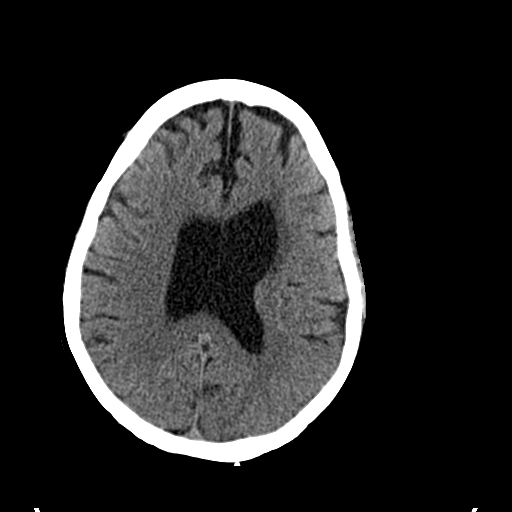
[im 20/30  brain]
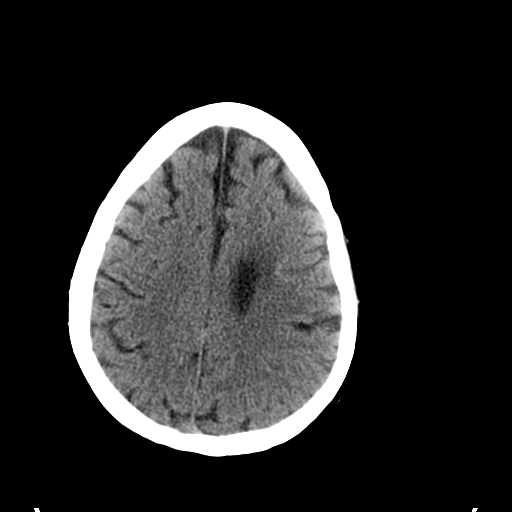
[im 21/30  brain]
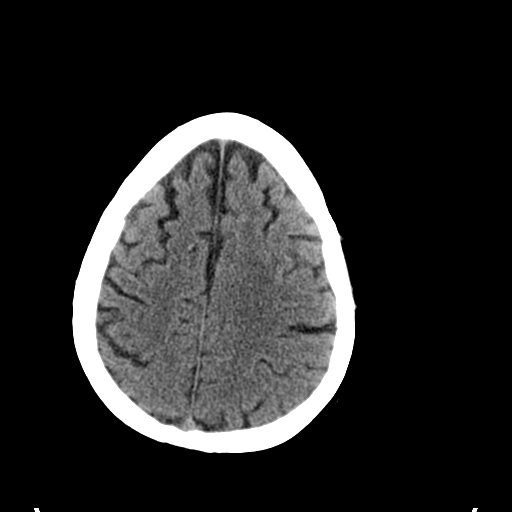
[im 22/30  brain]
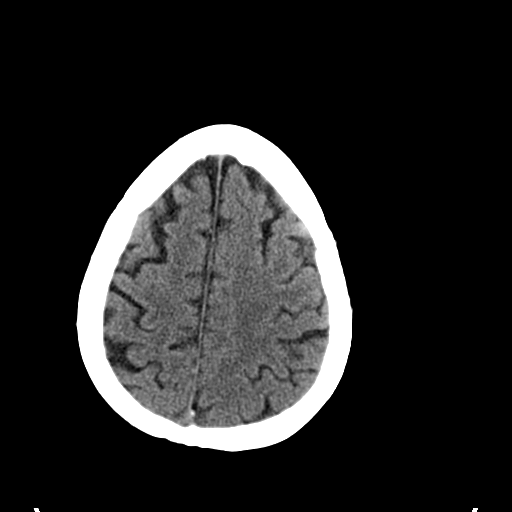
[im 22/30  bone]
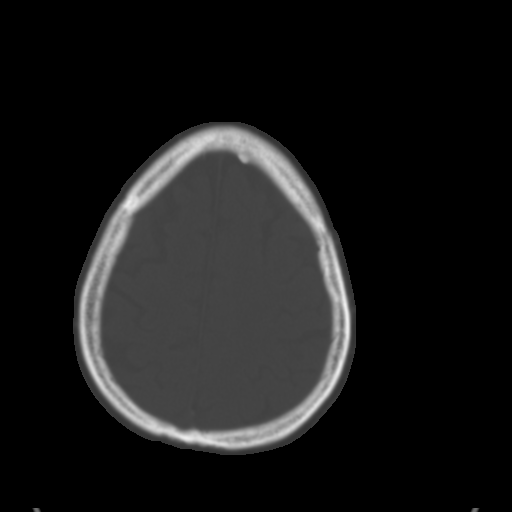
[im 25/30  brain]
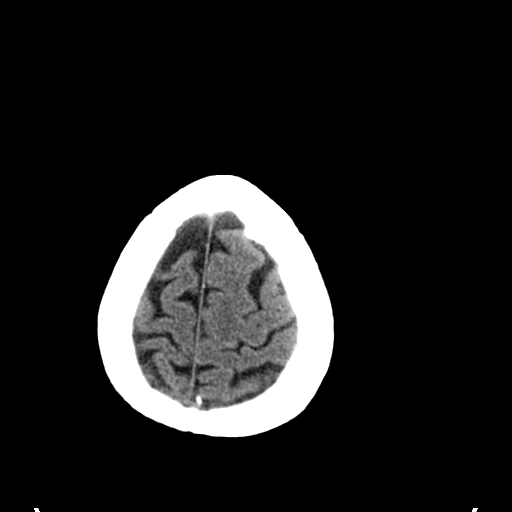
[im 26/30  brain]
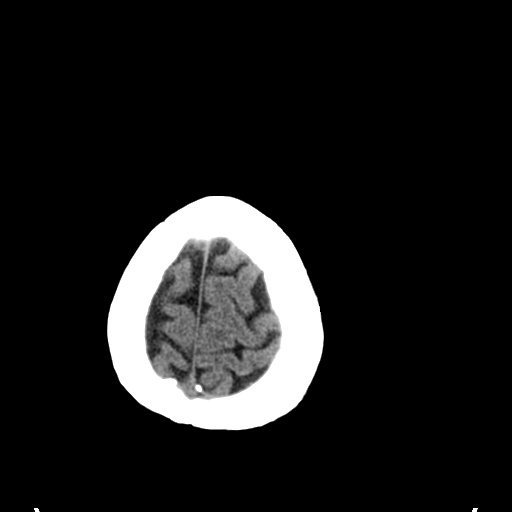
[im 28/30  brain]
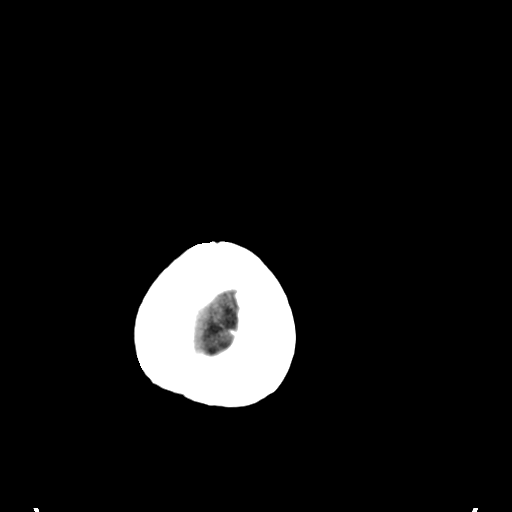

[16 of 30 positions shown; findings below may reference images not displayed]

FINDINGS: Atherosclerotic and physiologic intracranial
calcifications. Old left basal ganglia and caudate nucleus infarct
with parenchymal loss and ex vacuo dilatation of the adjacent
lateral ventricle. Mild diffuse parenchymal atrophy.  Negative for
acute intracranial hemorrhage, midline shift, mass, or mass effect.
Acute infarct may be inapparent on noncontrast CT.  Bone windows
reveal no calvarial lesion.
IMPRESSION: 1.  Negative for bleed or other acute intracranial process.
2.  Old left basal ganglia and caudate nucleus infarct.

## 2010-11-14 IMAGING — MR MR MRA HEAD W/O CM
8 of 12 series · 24 of 48 positions shown · non-contrast
Comparison: CT head 01/14/2009.  MRI brain 12/16/2008.

MRI HEAD

CLINICAL DATA: .  History of vertigo.  Her symptoms became much
worse this morning.  Previous CVA 10 years ago with residual right-
sided weakness.

MRI HEAD WITHOUT CONTRAST
MRA HEAD WITHOUT CONTRAST
TECHNIQUE: Multiplanar, multiecho pulse sequences of the brain and
surrounding structures were obtained according to standard protocol
without intravenous contrast.  Angiographic images of the head were
obtained using MRA technique without contrast.

[Series 1: loc · axial · 6.0mm · 0.59mm/px · z∈[-22,+150]mm · 2 of 19 slices shown]
[im 1/19]
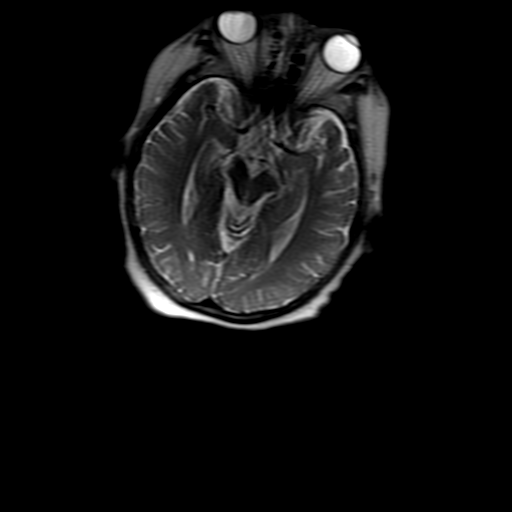
[im 19/19]
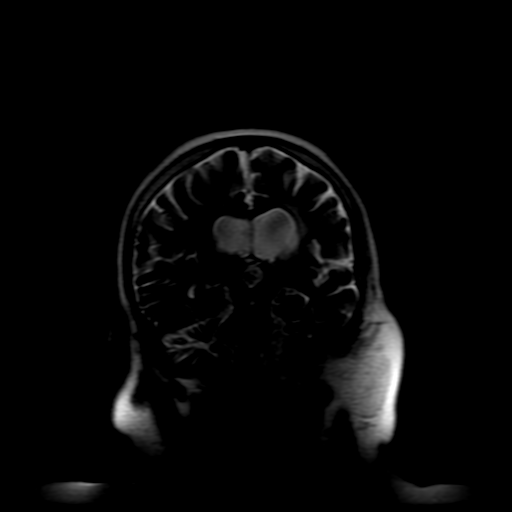

[Series 3: DWI · axial · 5.0mm · 1.09mm/px · z∈[-94,+51]mm · 6 of 60 slices shown (1 of 2)]
[im 1/60]
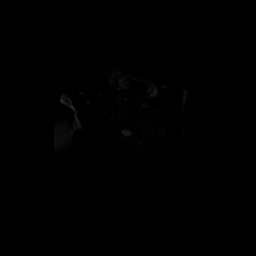
[im 12/60]
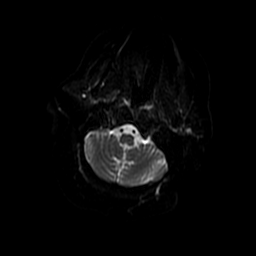
[im 24/60]
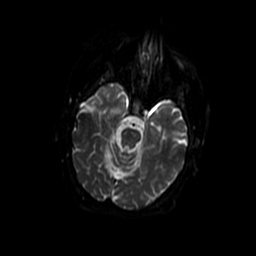
[im 36/60]
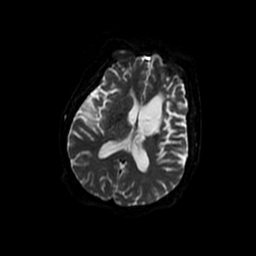
[im 48/60]
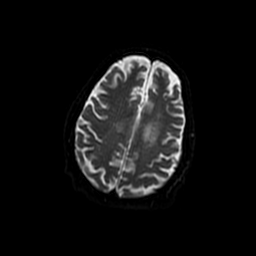
[im 60/60]
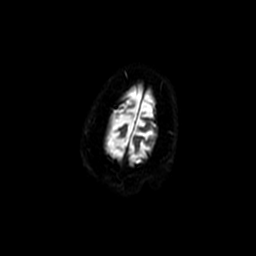

[Series 4: (id) mt fs · axial · 1.4mm · 0.43mm/px · z∈[-79,-27]mm · 5 of 137 slices shown]
[im 1/137]
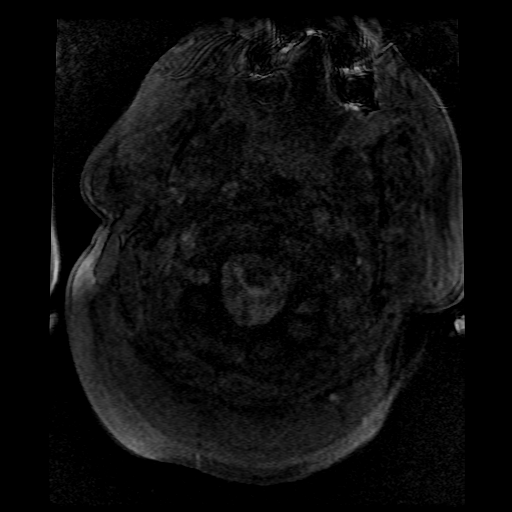
[im 21/137]
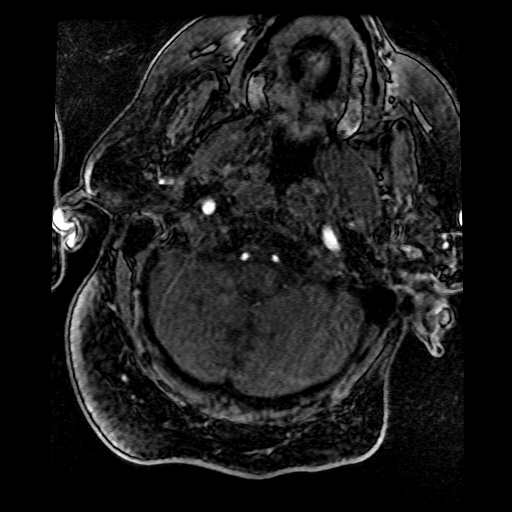
[im 42/137]
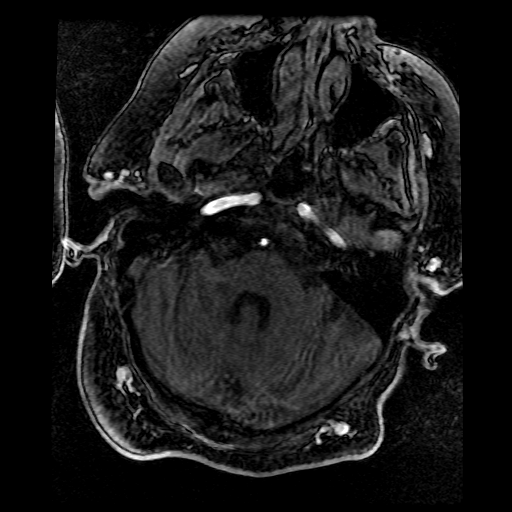
[im 63/137]
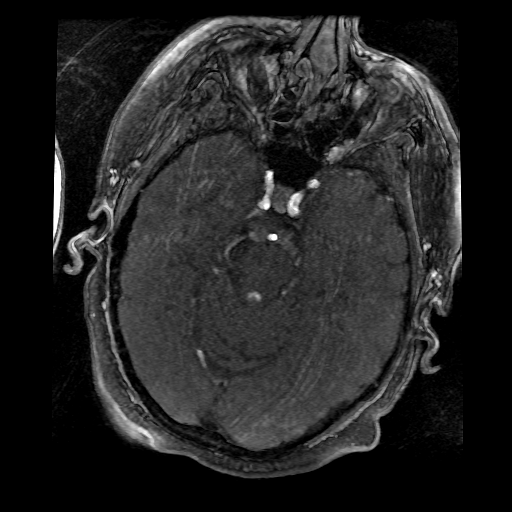
[im 74/137]
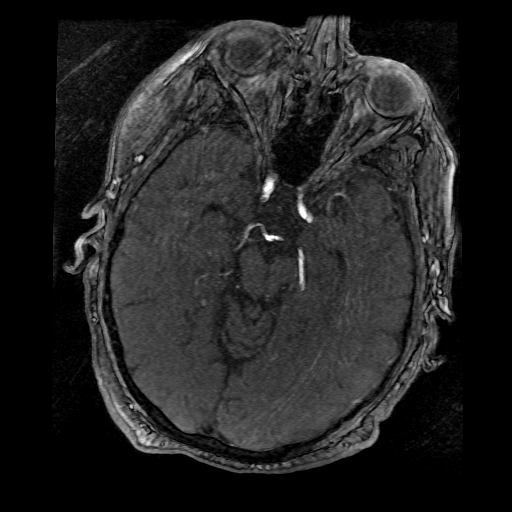

[Series 6: FLAIR · axial · 5.0mm · 0.43mm/px · z∈[-93,+53]mm · 2 of 22 slices shown]
[im 1/22]
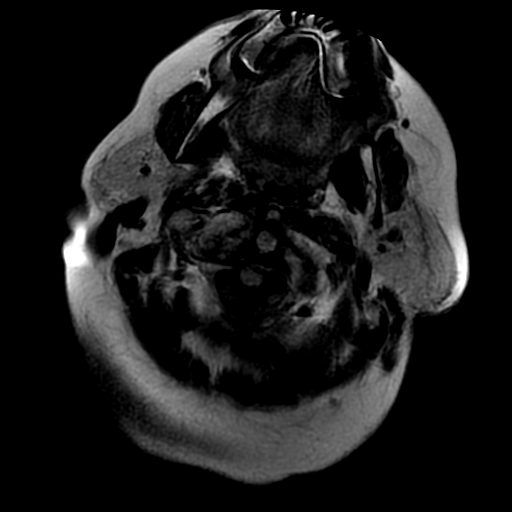
[im 22/22]
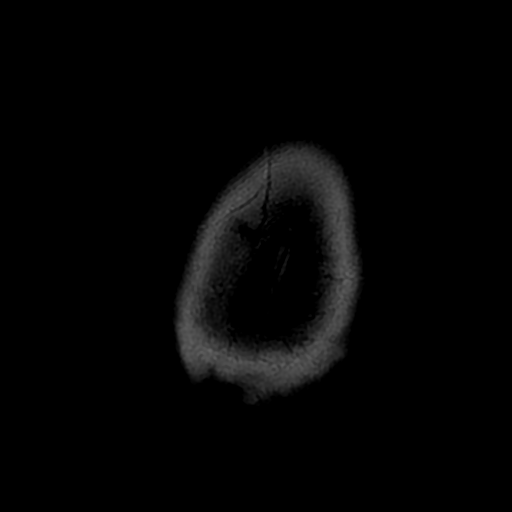

[Series 7: T2 · axial · 5.0mm · 0.43mm/px · z∈[-93,+53]mm · 2 of 22 slices shown (1 of 2)]
[im 1/22]
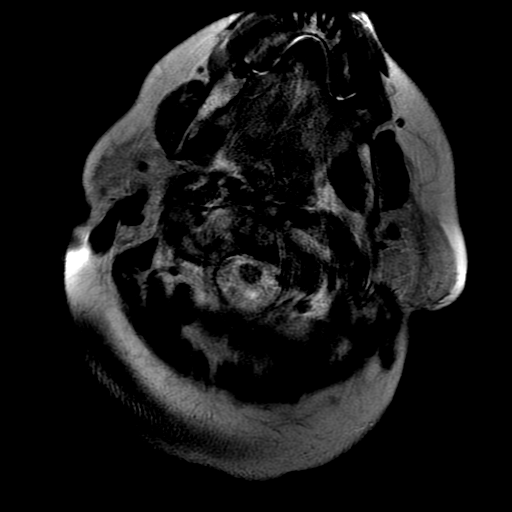
[im 22/22]
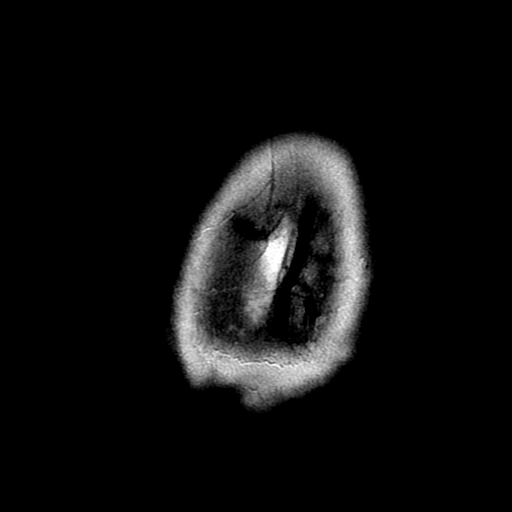

[Series 8: T1 · sagittal · 5.0mm · 0.47mm/px · 1 of 12 slices shown]
[im 1/12]
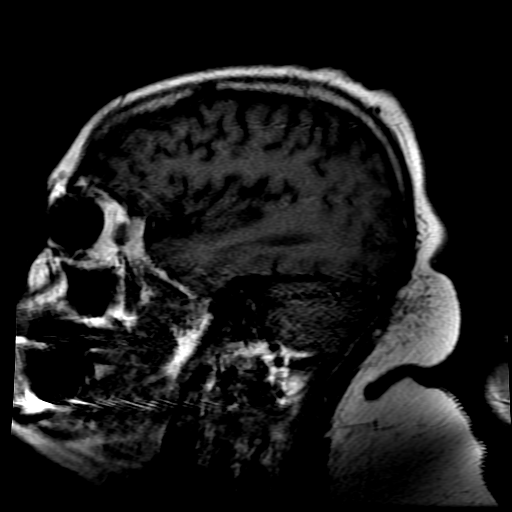

[Series 11: T2 · coronal · 5.0mm · 0.39mm/px · 3 of 24 slices shown (2 of 2)]
[im 1/24]
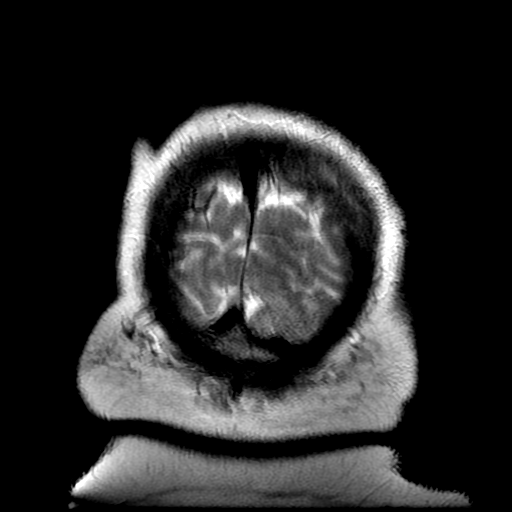
[im 12/24]
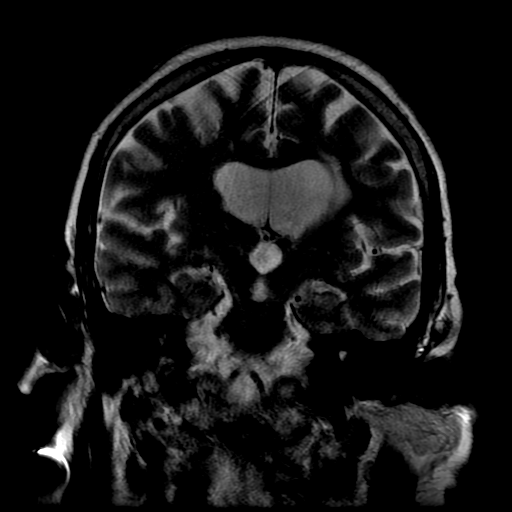
[im 24/24]
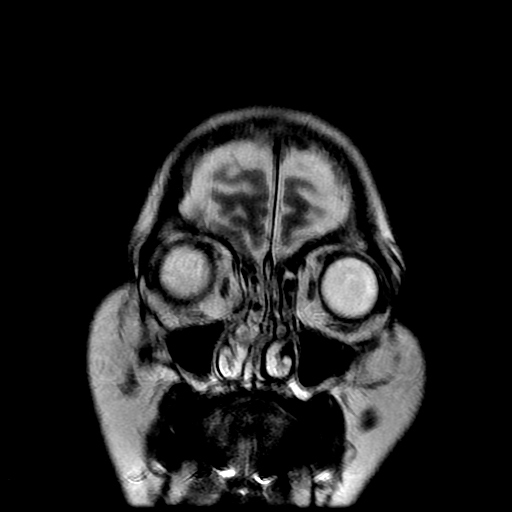

[Series 300: DWI · axial · 5.0mm · 1.09mm/px · z∈[-94,+51]mm · 3 of 30 slices shown (2 of 2)]
[im 1/30]
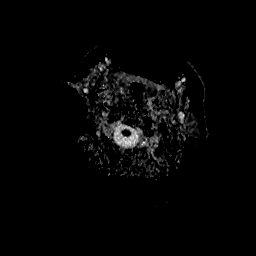
[im 15/30]
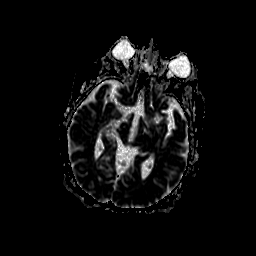
[im 30/30]
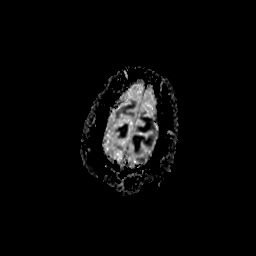

[24 of 48 positions shown; findings below may reference images not displayed]

FINDINGS: The study is somewhat degraded by patient motion.
Diffusion weighted images demonstrate no evidence for acute or
subacute infarction.  A remote left frontal periventricular infarct
is again noted.  Asymmetric left sided white matter disease is
stable.  Basal ganglia lacunar infarcts are unchanged.  No acute
intracranial abnormalities are seen.  Specifically, there is no
evidence for acute infarct, hemorrhage, mass, hydrocephalus, or
extra-axial fluid collection.  The area of concern as a possible
acute infarct involving the posterior right parietal lobe on the
prior study was likely artifactual as there is no significant T2 or
FLAIR signal abnormality in this region on today's exam.

Flow is present in the major intracranial arteries.  The patient is
status post bilateral lens extractions.  The globes and orbits are
otherwise intact.  The paranasal sinuses and mastoid air cells are
clear.
IMPRESSION: 1.  No acute intracranial abnormality.
2.  Stable left frontal periventricular infarcts.
3.  Remote to bilateral basal ganglia infarcts are stable.
4.  The area of concern for acute infarction on the prior exam was
likely artifactual.  There are no lesions in this area on today's
exam.

MRA HEAD
FINDINGS: Normal signal is present in the internal carotid arteries
and high cervical segments through the ICA termini bilaterally.
The A1 and M1 segments are normal.  Distal branch vessel
irregularity within the MCA distribution bilaterally and distal
right ACA distribution is compatible with intracranial
atherosclerotic changes.

The vertebral arteries are codominant.  Both PICA vessels are seen.
Both posterior cerebral arteries originate from the basilar tip.
There is segmental narrowing of the distal PCA branch vessels,
right worse than left.
IMPRESSION: 1.  Bilateral MCA, PCA and right PCA small vessel segmental
irregularity compatible with intracranial atherosclerotic type
change.
2.  No significant proximal stenosis, aneurysm, or branch vessel
occlusion.

## 2010-11-29 NOTE — Op Note (Signed)
NAME:  Susan House, Susan House NO.:  [id number]   MEDICAL RECORD NO.:  [id number]          PATIENT TYPE:  INP   LOCATION:  3006                         FACILITY:  [facility]   PHYSICIAN:  Ophelia Charter, M.D.DATE OF BIRTH:  1955/12/08   DATE OF PROCEDURE:  06/04/2008  DATE OF DISCHARGE:                               OPERATIVE REPORT   BRIEF HISTORY:  The patient is a 55 year old white female who has  suffered from neck and leg pains.  She failed medical management, was  worked up with lumbar MRI, which demonstrated the patient had severe  degenerative disease at L4-L5 with stenosis.  I discussed the various  treatment options with the patient and her husband including surgery.  The patient has weighed the risks, benefits, and alternatives of the  surgery, so I will proceed with the L4-L5 decompression,  instrumentation, and fusion.   PREOPERATIVE DIAGNOSES:  1. L4-L5 degenerative disease.  2. Spinal stenosis.  3. Lumbar radiculopathy status post myelopathy.  4. Lumbago.  5. Facet arthropathy.   POSTOPERATIVE DIAGNOSES:  1. L4-L5 degenerative disease.  2. Spinal stenosis.  3. Lumbar radiculopathy status post myelopathy.  4. Lumbago.  5. Facet arthropathy.   PROCEDURE:  L4 decompressive laminectomy to decompress the bilateral L4  and L5 nerve roots; L4-L5 posterior lumbar interbody fusion with local  morselized autograft bone and Actifuse/Mastergraft bone graft extender;  insertion of the L4-L5 interbody prosthesis (Capstone PEEK interbody  prosthesis); L4-L5 posterior nonsegmental instrumentation with Legacy  titanium pedicle screws and rods; L4-L5 posterolateral arthrodesis with  local morselized autograft bone and Actifuse/Mastergraft bone graft  extender.   SURGEON:  Ophelia Charter, M.D.   ASSISTANT:  None.   ANESTHESIA:  General endotracheal.   ESTIMATED BLOOD LOSS:  250 mL.   SPECIMENS:  None.   DRAINS:  None.   COMPLICATIONS:  None.   DESCRIPTION OF PROCEDURE:  The patient was brought to the operating room  by the anesthesia team.  General endotracheal anesthesia was induced.  The patient was then turned to the prone position on the Wilson frame.  The lumbosacral region was then prepared with Betadine scrub and  Betadine solution.  Sterile drapes were applied and I injected the area  to be incised with Marcaine with epinephrine solution.  I used a scalpel  to make a linear midline incision over the L4-L5 interspace.  I used  electrocautery to perform a bilateral subperiosteal dissection exposing  the spinous process, lamina of L3, L4, and L5 and the upper sacrum.  We  obtained intraoperative radiograph to confirm our location.  I then  inserted a Versatrac retractor for exposure.  We began the decompression  by using a high-speed drill to perform a bilateral L4 laminotomy.  Of  note, because of the patient's severe facet arthropathy and lateral  recess foraminal stenosis, a wide lateral decompression was required at  L4-L5 and we performed wide foraminotomies above the L4 and L5 nerve  roots bilaterally.  We used a high-speed drill to perform bilateral L4  laminotomies and we widened the laminotomies with Kerrison punch and  then removed the medial aspect of the facet joints.  We removed the  excess ligamentum flavum from the lateral recesses and then we performed  wide foraminotomies above the bilateral L4 and L5 nerve root completing  decompression at this level.   Having completed the decompression, we now turned our attention to the  posterior lumbar interbody fusion.  We had one lateral exposure to the  L4-L5 intervertebral disk bilaterally.  We incised the disk with a 15-  blade scalpel and performed a partial intervertebral dissection with the  pituitary forceps and Karlin curettes.  We then prepared the vertebral  endplates for fusion by clearing the soft tissue using the curette.  We  then used trial spacers  and determined to use a 8 x 26 mm Capstone  interbody prosthesis on the right and 10 x 26 mm on the left because  there was asymmetry of the disk space.  We prefilled the prosthesis with  a combination of local autograft bone and Actifuse and Mastergraft bone  graft extender.  We inserted the prosthesis into the interspaces  bilaterally, of course, after retracting the neural structures out of  harm's way.  I should mention we filled the lateral, ventral, and medial  to the prosthesis with a combination of the local autograft bone we  obtained during the decompression as well as Actifuse and Mastergraft  bone graft extender.  This completed the posterior lumbar interbody  fusion.   We now turned our attention to instrumentation.  Under fluoroscopic  guidance, we cannulated the bilateral L4 and L5 pedicles with the bone  probe.  We tapped the pedicles with a 5.5 mm tap.  We then probed the  inside of the tap pedicles with a ball probe and noticed no cortical  breaches.  We inserted a 7.5 x 45 mm pedicle screws bilaterally at L4  and L5.  We did this under fluoroscopic guidance.  We then palpated  along the medial aspect of the bilateral L4-L5 pedicles and noted that  there were no cortical breeches and the L4 and L5 nerve roots were not  injured.  We then connected the unilateral pedicle screws with lordotic  rod, compressed to construct, and then secured the rod in place with  caps, which we tightened appropriately.  This completed the  instrumentation.   We now turned our attention to posterolateral arthrodesis.  We used a  high-speed drill to decorticate the remainder of the L4-L5 facets, pars,  and transverse processes at L4 and L5.  We then laid a combination of  local autograft bone and Actifuse/ Mastergraft bone graft extender.  This completes the posterolateral arthrodesis.  We then irrigated the  wound with bacitracin solution.  We inspected the thecal sac and the  bilateral L4  and L5 nerve roots, and noted they were well decompressed.  We obtained hemostasis using bipolar electrocautery.  We then removed  the retractor and then reapproximated the patient's thoracolumbar fascia  with interrupted #1 Vicryl suture, the subcutaneous tissue with  interrupted 2-0 Vicryl suture, and the skin with Steri-Strips and  Benzoin.  The wound was then coated with  bacitracin ointment.  A sterile dressing was applied.  The drapes were  removed.  The patient was subsequently returned to supine position where  she was extubated by the anesthesia team and transported to the post  anesthesia care unit in stable condition.  All sponge, instrument, and  needle counts were correct at the end of the case.  Ophelia Charter, M.D.  Electronically Signed     JDJ/MEDQ  D:  06/05/2008  T:  06/06/2008  Job:  [id number]

## 2010-11-29 NOTE — H&P (Signed)
Susan House, FILTZ NO.:  [id number]   MEDICAL RECORD NO.:  [id number]          PATIENT TYPE:  INP   LOCATION:  [place]                         FACILITY:  [place] [facility]   PHYSICIAN:  Karlyn Agee, M.D. DATE OF BIRTH:  July 01, 1956   DATE OF ADMISSION:  01/15/2009  DATE OF DISCHARGE:                              HISTORY & PHYSICAL   PRIMARY CARE PHYSICIAN:  Dr. Miguel Aschoff.   NEUROLOGIST:  Dr. Asencion Partridge Dohmeier.   PODIATRIST:  Dr. Cleda Mccreedy in [place].   CHIEF COMPLAINT:  Sudden onset of nausea and vertigo about midday today.   HISTORY OF PRESENT ILLNESS:  This is a 55 year old Caucasian lady with a  history of first CVA in 40 at age 44 with residual left hemiplegia,  but who is able to ambulate slowly with a cane or a walker, who then  developed episodic vertigo about 2 months ago and eventually was seen by  Dr. Brett Fairy about 2 weeks ago and diagnosed with a second stroke, said  to be the cause of her vertigo, memory problems and delayed swallowing  and subsequently has been stable on blood thinners, but who suddenly  developed an episode of vertigo while sitting down today associated with  nausea but not associated with tinnitus and eventually the patient was  brought to the emergency room by ambulance to be evaluated.  The patient  is noting no focal weakness, but she has been having a persistent  headache.  She has also noted slowing of her speech and she has to be  very careful with her swallowing but she is having no choking.   She has been having no fever, cough or cold.  She has noted no  hypoglycemic episodes.  She has been having recurrent falls because of  her gait instability and currently is wearing a splint to her left ankle  because of recent fall with damage to the ligaments of her ankle.   PAST MEDICAL HISTORY:  1. Recurrent CVAs.  2. Hypertension.  3. Diabetes.  4. Cataracts.  5. Hyperlipidemia.  6. History of restless leg syndrome.  7.  History of recurrent urinary tract infections.  8. Chronic vertigo.   PAST SURGICAL HISTORY:  1. Status post appendectomy.  2. Cataract surgery.  3. Cholecystectomy.  4. Fusion of the lumbar spine.  5. C-section.  6. Hysterectomy.   MEDICATIONS:  1. Hydrochlorothiazide 25 mg daily.  2. Byetta 5-10 units twice daily with breakfast and supper.  3. Lantus 30 units at bedtime.  4. Celexa 40 mg daily.  5. Wellbutrin 150 mg daily.  6. Oxybutynin 5 mg twice daily.  7. Enalapril 20 mg twice daily.  8. Diltiazem 240 mg twice daily.  9. Baclofen 20 mg four times daily.  10.Plavix 75 mg daily.  11.Aspirin 81 mg daily.  12.Multivitamins 1 daily.  13.Septra double-strength 1 daily prophylaxis.  14.Pravachol 40 mg at bedtime.  15.Valium 5 mg at bedtime for restless legs.  16.Valium 2 mg p.r.n. usually once daily for vertigo.  The patient      said she took an extra one at midday  today when the vertigo      started.   ALLERGIES:  1. MORPHINE CAUSES ANAPHYLAXIS.  2. DILAUDID CAUSES RESPIRATORY DEPRESSION.  3. REGLAN CAUSES ACUTE HYPERTENSION.  4. BETADINE CAUSES A RASH.  5. TETRACYCLINE CAUSES GENERALIZED ITCHING.   SOCIAL HISTORY:  The patient used to work as a Marine scientist, but since her  stroke has been on disability.  She denies tobacco, alcohol or illicit  drug use.  There is no family history of early strokes.   REVIEW OF SYSTEMS:  A 10-point review of systems significant only for  deliberate loss of about 90 pounds over the past 15 months.  Ambulates  by cane or a walker or for long distances she uses an electronic  wheelchair.  She has been trying to do some physical exercise, walking  in the park when able, but recently has been falling with increased  frequency.  Other than this, a 10-point review of systems is  unremarkable.   PHYSICAL EXAMINATION:  GENERAL:  Obese middle-aged Caucasian lady lying  on the stretcher in no acute distress.  VITAL SIGNS:  Her temperature is 97.1,  respirations 14, pulse is 57,  blood pressure 129/68.  She is saturating at 96% on room air.  HEENT:  Pupils are round, equal and reactive.  Mucous membranes pink and  anicteric.  NECK:  She has no cervical lymphadenopathy or thyromegaly.  No  jugulovenous distention.  CHEST:  Clear to auscultation bilaterally.  CARDIOVASCULAR:  Regular rhythm without murmur.  ABDOMEN:  Obese, soft and nontender.  EXTREMITIES:  She has external rotation of the left lower extremity and  extensive bruising of the thigh  above the left knee and she has flexion  deformity at rest of the left wrist.  She has 2+ dorsalis pedis equal  bilaterally.  There is no edema.  SKIN:  Warm and dry.  There is no ulcerations.  CENTRAL NERVOUS SYSTEM:  Cranial nerves II through XII appear grossly  intact.  She has a marked left hemiplegia.  Gait was not tested.   LABORATORY DATA:  Her white count is 6.2, hemoglobin 11.7, MCV 89.4,  platelets 180.  She has 13% eosinophils and an absolute eosinophil count  was elevated at 0.8, otherwise unremarkable.  Complete metabolic panel:  Sodium was 138, potassium 3.7, chloride 105, CO2 of 27, glucose 112, BUN  27, creatinine 1.25.  Liver functions are completely normal.  Cardiac  markers repeated showed undetectable CK-MB and troponins and normal  myoglobin.  Urinalysis not yet done.  Chest x-ray shows old right  clavicular fracture, stable cardiomegaly and chronic interstitial  prominence, no acute or superimposed abnormality.  CT scan of the brain  without contrast was negative for blood or other acute process.  She has  an old left basal ganglia and caudate nucleus infarct.   ASSESSMENT:  1. Acute vertigo without tinnitus in a lady with a history of acute      strokes presenting as vertigo.  2. Diabetes type 2, controlled.  3. Hypertension, controlled.  4. History of hyperlipidemia.  5. Eosinophilia.  6. Obesity.   PLAN:  This patient has been discussed with the neurologist  on-call, who  has declined admission and recommended admission by the hospitalist  service for a neurology consult.  Therefore, we will admit this patient  for further assessment of her cardiovascular risk factors.  She in fact  has outpatient workup pending and we will go ahead and start those with  a 2-D echo, carotid  Doppler and we will schedule an MRI.  We will check  her serum homocysteine, ESR, CRP, etc.  We will keep her n.p.o. for now  and schedule her a swallowing evaluation.  We will discuss her with the  neurologist service in the morning.      Karlyn Agee, M.D.  Electronically Signed     LC/MEDQ  D:  01/15/2009  T:  01/15/2009  Job:  [id number]   cc:   Larey Seat, M.D.  Richard Rosanna Randy

## 2010-11-29 NOTE — Discharge Summary (Signed)
NAMESUNNYE, SAARINEN NO.:  [id number]   MEDICAL RECORD NO.:  [id number]          PATIENT TYPE:  INP   LOCATION:  6711                         FACILITY:  [facility]   PHYSICIAN:  Sherryl Manges, M.D.  DATE OF BIRTH:  [birth year]   DATE OF ADMISSION:  01/14/2009  DATE OF DISCHARGE:  01/16/2009                               DISCHARGE SUMMARY   PRIMARY CARE PHYSICIAN:  Miguel Aschoff   PRIMARY NEUROLOGIST:  Larey Seat, M.D.   PRIMARY PODIATRIST:  Dr. Caryl Comes, [place], [place].   DISCHARGE DIAGNOSES:  1. Vertebrobasilar transient ischemic attack.  2. History of cerebrovascular disease, status post multiple      cerebrovascular accidents/residual left hemiparesis.  3. Episodic vertigo.  4. Hypertension.  5. Type 2 diabetes mellitus.  6. Dyslipidemia.  7. Obesity.  8. History of cataracts.  9. Restless leg syndrome.  10.History of recurrent urinary tract infections.   DISCHARGE MEDICATIONS:  1. Hydrochlorothiazide 25 mg p.o. daily.  2. Byetta 5 to 10 units subcutaneously b.i.d. with breakfast and      supper.  3. Lantus insulin 30 units subcutaneously q.h.s.  4. Celexa 40 mg p.o. daily.  5. Wellbutrin XL 150 mg p.o. daily.  6. Enalapril 20 mg p.o. b.i.d.  7. Cardizem CD 240 mg p.o. b.i.d.  8. Baclofen 20 mg p.o. q.i.d.  9. Multivitamin one p.o. daily.  10.Septra DS one p.o. daily.  11.Pravachol 40 mg p.o. q.h.s.  12.Valium 5 mg p.o. q.h.s.  13.Meclizine 12.5 mg p.o. p.r.n. q.8h. for vertigo.  14.Aggrenox (200/ 25), one p.o. q.h.s. for 2 weeks, then one p.o.      b.i.d.  15.Oxybutynin 5 mg p.o. b.i.d.  16.Aspirin 81 mg p.o. daily for 2 weeks, then discontinued.   Note:  Plavix and 2 mg p.r.n. Valium have all been discontinued.   PROCEDURES:  1. Chest x-ray done 01/14/2009 showed stable cardiomegaly and chronic      interstitial prominence.  No acute superimposed abnormality.  2. Head CT scan done 01/14/2009:  This was negative for  hemorrhage or      other acute intracranial process.  Old left basal ganglia and      caudate nuclear infarct noted.  3. Brain MRI / MRA 01/15/2009:  This showed no acute intracranial      abnormality although stable left frontal periventricular infarcts      remote, two bilateral basal ganglia infarcts are stable.  The area      of concern for acute infarction on prior examination is likely      artifactual although no new lesions in this area on examination on      01/15/2009.  4. Brain MRI on 01/15/2009:  This showed bilateral MCA, PCA and right      PCA small vessel segment irregularity compatible with intracranial      atherosclerotic type change, no significant proximal stenosis, or      branch vessel occlusion.  5. 2D echocardiogram done 01/15/2009:  This showed normal left      ventricular cavity size, wall thickness increased in part by mild  LVH, systolic function normal.  EF 65%.  The parameters are      consistent with abnormal left ventricular relaxation (diastolic      dysfunction).  There was moderately calcified mitral valve annulus.      The left atrium was mildly dilated.  Peak pulmonary artery pressure      was 30 mmHG.  Inferior vena cava was normal in size with normal      respirophasic diameter changes (50%).  Small pericardial effusion was identified posterior to the heart.   CONSULTATIONS:  1. Dr. Antony Contras, Neurology.   ADMISSION HISTORY:  As in H and P of 01/15/2009, dictated by Karlyn Agee, M.D. However, in brief, this is a 55 year old female, with  known history of recurrent CVAs, residual left hemiparesis,  hypertension, diabetes mellitus, cataracts, dyslipidemia, restless leg  syndrome, chronic vertigo and recurrent urinary tract infections, status  post appendectomy, status post cataract surgery, status post  cholecystectomy, status post lumbar spine fusion, previous Cesarean  section, status post hysterectomy, presenting following an acute  episode  of vertigo, associated with slowing of speech, without associated  dysphasia or new extremity weakness.  She presented to the Emergency  Department, and was admitted for further evaluation, investigation and  management.   CLINICAL COURSE:  1. Vertebrobasilar TIA.  For details of presentation, refer to      admission history above.  The patient has a known history of      recurrent episodes of vertigo of approximately 2 months' duration      however she now presented with a severe acute episode associated      with slowing of speech.  There were no new neurologic findings.      She underwent CVA workup, which essentially showed no evidence of      new cerebrovascular accident.  She has therefore been placed on      p.r.n. Meclizine for symptomatic purposes.  Neurology consultation      was kindly provided by Dr. Antony Contras.  For details of the      consultation, refer to the consultation note of 01/15/2009.  He      recommended discontinuation of Aspirin and Plavix, and switching      patient to Aggrenox.  This has been instituted accordingly.      Fortunately, the patient had no further recurrences of      symptomatology during the course of her hospitalization.  She was      evaluated by PT/OT and continued home health PT is recommended.   1. Type 2 diabetes mellitus.  This is insulin requiring.  The patient      was managed with Lantus insulin, as well as sliding scale insulin      coverage and a carbohydrate modified diet, during the course of her      hospitalization. For the most part, she remained euglycemic.   1. Hypertension.  This was adequately controlled with pre-admission      antihypertensive medications.   1. Dyslipidemia.  The patient continues on statin treatment.   1. Restless leg syndrome.  This did not prove problematic.   1. History of recurrent urinary tract infections.  The patient is on      prophylactic antibiotics for this.  This was  continued.   DISPOSITION:  The patient was on 01/16/2009 completely asymptomatic,  ready to go, was seen by Neurology, and cleared for discharge.  She was  therefore discharged accordingly.  DIET:  Heart healthy/ carbohydrate modified.   ACTIVITY:  As tolerated, otherwise, per PT.   FOLLOWUP:  The patient is to followup routinely with her primary care  physician Dr. Miguel Aschoff, and followup routinely with primary  neurologist Dr. Asencion Partridge Dohmeier.      Sherryl Manges, M.D.  Electronically Signed     CO/MEDQ  D:  01/16/2009  T:  01/16/2009  Job:  [id number]   cc:   Bertram Denver, M.D.  Dr. Caryl Comes podiatrist

## 2010-11-29 NOTE — Consult Note (Signed)
NAME:  Susan House, Susan House NO.:  [id number]   MEDICAL RECORD NO.:  [id number]          PATIENT TYPE:  INP   LOCATION:  6711                         FACILITY:  [facility]   PHYSICIAN:  Pramod P. Leonie Man, MD    DATE OF BIRTH:  [birth year]   DATE OF CONSULTATION:  DATE OF DISCHARGE:                                 CONSULTATION   REFERRING PHYSICIAN:  Sherryl Manges, MD   REASON FOR REFERRAL:  TIA.   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  She has been having some intermittent  positional vertigo for the last month or two and she has seen Dr.  Brett Fairy in the office and had an outpatient workup, MRI in June of this  year, which did not show an acute stroke and showed only old bilateral  basal ganglia and right and left frontal infarcts.  She came in  yesterday when she called for an episode of acute vertigo, which lasted  6-8 hours along with nausea.  She was admitted last night and has felt  better and symptoms have resolved today.  She has undergone an MRI scan  of the brain, which has not shown an acute stroke.  MRI of the brain  shows only mild intracranial atherosclerotic changes and carotid  Dopplers have shown no significant extracranial stenosis.  A 2-D echo  was done but results are pending.  Lipid profile, hemoglobin A1c, and  homocysteine are all normal.  She has currently been on aspirin and  Plavix for last 1 month, prior to that she was only on aspirin alone.  She has been tolerating this without bleeding or other side effects.   PAST MEDICAL HISTORY:  Significant for strokes, hypertension, diabetes,  obesity, cataracts, restless leg syndrome, recurrent bladder infections.   HOME MEDICATIONS:  On aspirin, baclofen,  Byetta, Celexa, diltiazem,  enalapril, hydrochlorothiazide, Lantus insulin, multivitamins,  oxybutynin, Plavix,  Pravachol, Septa, Valium, and Wellbutrin.   PAST SURGICAL HISTORY:  Appendicectomy, cataract surgery,  cholecystectomy, lumbar spine fusion, hysterectomy.   MEDICATION ALLERGIES:  MORPHINE, DILAUDID, REGLAN, BETADINE, and  TETRACYCLINE.   SOCIAL HISTORY:  The patient is married, lives with her husband, does  not smoke or drink or abuse drugs.   REVIEW OF SYSTEMS:  Negative for recent fever, cough, chest pain,  diarrhea, or illness.   PHYSICAL EXAMINATION:  GENERAL:  Obese middle-aged Caucasian lady  currently not in distress.  VITAL SIGNS:  She is febrile with pulse rate of 54 per minute, regular  sinus, blood pressure 114/65, oxygen sats 98% on room air.  HEENT:  Head is nontraumatic.  ENT exam unremarkable.  NECK:  Supple.  There is no bruit.  CARDIAC:  No murmur or gallop.  LUNGS:  Clear to auscultation.  NEUROLOGIC:  She is pleasant, awake, alert, cooperative.  There is no  aphasia, apraxia, or dysarthria.  Eye movements are full range without  nystagmus.  She has a left homonymous hemianopsia.  She  has left lower  facial weakness.  Tongue is midline.  Motor system exam reveals no upper  extremity drift, but she has spastic hemiparesis on the left.  She has  4/5 strength on the left, but has significant weakness of the left grip  and intrinsic hand muscles.  She can barely extend the fingers.  She has  increased tone on the left side with increased reflexes and he has  sustained clonus of the left ankle.  Left plantar is upgoing, right is  downgoing.  She has subjective decreased sensation on the left.  Coordination is slow and slightly impaired on the left.  Gait was not  tested.   DATA REVIEWED:  MRI scan of the brain done yesterday reveals no acute  infarct.  Old left frontal infarct with encephalomalacia and dilatation  of left lateral ventricle is noted.  Bilateral old basal ganglia  infarcts are noted as well.  MRA of the brain shows mild intracranial  atherosclerotic  changes.  Carotid ultrasound shows no significant  extracranial stenosis.  2-D echo is done, results are pending.  Hemoglobin A1c, homocystine, and lipid profile are all normal.   IMPRESSION:  A 55-year lady with previous right cerebral infarcts with  multiple vascular risk factors, presents with 6-8 hour episode of  vertigo and nausea, likely a posterior circulation TIA secondary to  small vessel disease.   PLAN:  I would recommend changing the Plavix to Aggrenox for secondary  stroke prevention.  Begin with Aggrenox once a day for 2 weeks and  increase if tolerated without headache to twice a day.  She may continue  aspirin for the first 2 weeks and stop when she reaches the Aggrenox  twice a day.  She may use Tylenol for pretreatment for her headaches.  Continue aggressive treatment of diabetes and hypertension.  She will  follow up electively in the future with Dr. Brett Fairy her neurologist.   Thank you for the referral.           ______________________________  Kathie Rhodes. Leonie Man, MD     PPS/MEDQ  D:  01/15/2009  T:  01/16/2009  Job:  [id number]

## 2010-12-02 NOTE — Discharge Summary (Signed)
NAME:  Susan House, Susan House NO.:  [id number]   MEDICAL RECORD NO.:  [id number]          PATIENT TYPE:  INP   LOCATION:  3006                         FACILITY:  [facility]   PHYSICIAN:  Ophelia Charter, M.D.DATE OF BIRTH:  Dec 14, 1955   DATE OF ADMISSION:  06/04/2008  DATE OF DISCHARGE:  06/11/2008                               DISCHARGE SUMMARY   BRIEF HISTORY:  The patient is a 55 year old white female who has  suffered from back and leg pains.  She has failed medical management and  worked up with a lumbar MRI, which demonstrated that the patient had  severe degenerative disk disease at L4-L5 with stenosis.  I discussed  the various treatment options with the patient and her husband including  surgery.  The patient is aware of the risks, benefits, and alternatives  to surgery and decided to proceed with an L4-5 decompression,  instrumentation and fusion.   For further details of this admission, please refer to typed history and  physical.   HOSPITAL COURSE:  I admitted the patient to [facility] on  June 04, 2008.  On the day of admission, I performed an L4-5  decompression, instrumentation and fusion.  The surgery went well (for  full details of this operation, please refer to the typed operative  note).   POSTOPERATIVE COURSE:  The patient's postoperative course was as  follows, the patient was a bit anemic with a hemoglobin of 7.1.  She was  treated with iron, and she remained asymptomatic clinically.  We had PT  and OT see the patient and work with her.  We have the rehab team see  her and they did not think that she needed inpatient rehab, and the  patient was subsequently discharged to home on June 11, 2008.   DISCHARGE INSTRUCTIONS:  The patient was given written discharge  instructions and was asked to follow up with me in 4 weeks.   DISCHARGE PRESCRIPTIONS:  1. Percocet 10/325, #100, 1 p.o. q.4 h. p.r.n. for pain.  2. Valium 5 mg, #50, 1  p.o. q.8 h. p.r.n. muscle spasms.  3. Ferrous sulfate 325 mg, #60, 1 p.o. b.i.d.   FINAL DIAGNOSES:  L4-5 degenerative disk disease, spinal stenosis,  lumbar radiculopathy, myelopathy, lumbago, facet arthropathy and blood  loss anemia.   PROCEDURE PERFORMED:  L4-5 decompressive laminectomy, decompressive  bilateral L4 as well as L5 nerve roots; L4-5 posterior lumbar interbody  fusion with local morselized autograft bone and Actifuse/Mastergraft  bone  graft extender; insertion of the L4-5 interbody prosthesis (Capstone  PEEK interbody prosthesis); L4-5 posterior nonsegmental instrumentation  with Legacy titanium pedicle screws and rods; L4-5 posterolateral  arthrodesis with local morselized autograft bone and  Actifuse/Mastergraft bone graft extender.      Ophelia Charter, M.D.  Electronically Signed     JDJ/MEDQ  D:  07/30/2008  T:  07/31/2008  Job:  [id number]

## 2010-12-28 IMAGING — CR RIGHT HAND - COMPLETE 3+ VIEW
1 series · 3 of 3 positions shown · non-contrast
Comparison: none

REASON FOR EXAM: fall
COMMENTS:   LMP: Post Hysterectomy

PROCEDURE:     DXR - DXR HAND RT COMPLETE W/OBLIQUES  - February 28, 2009 [DATE]
RESULT:

[Series 1: view not recorded · 0.17mm/px · 3 of 3 slices shown]
[im 1/3]
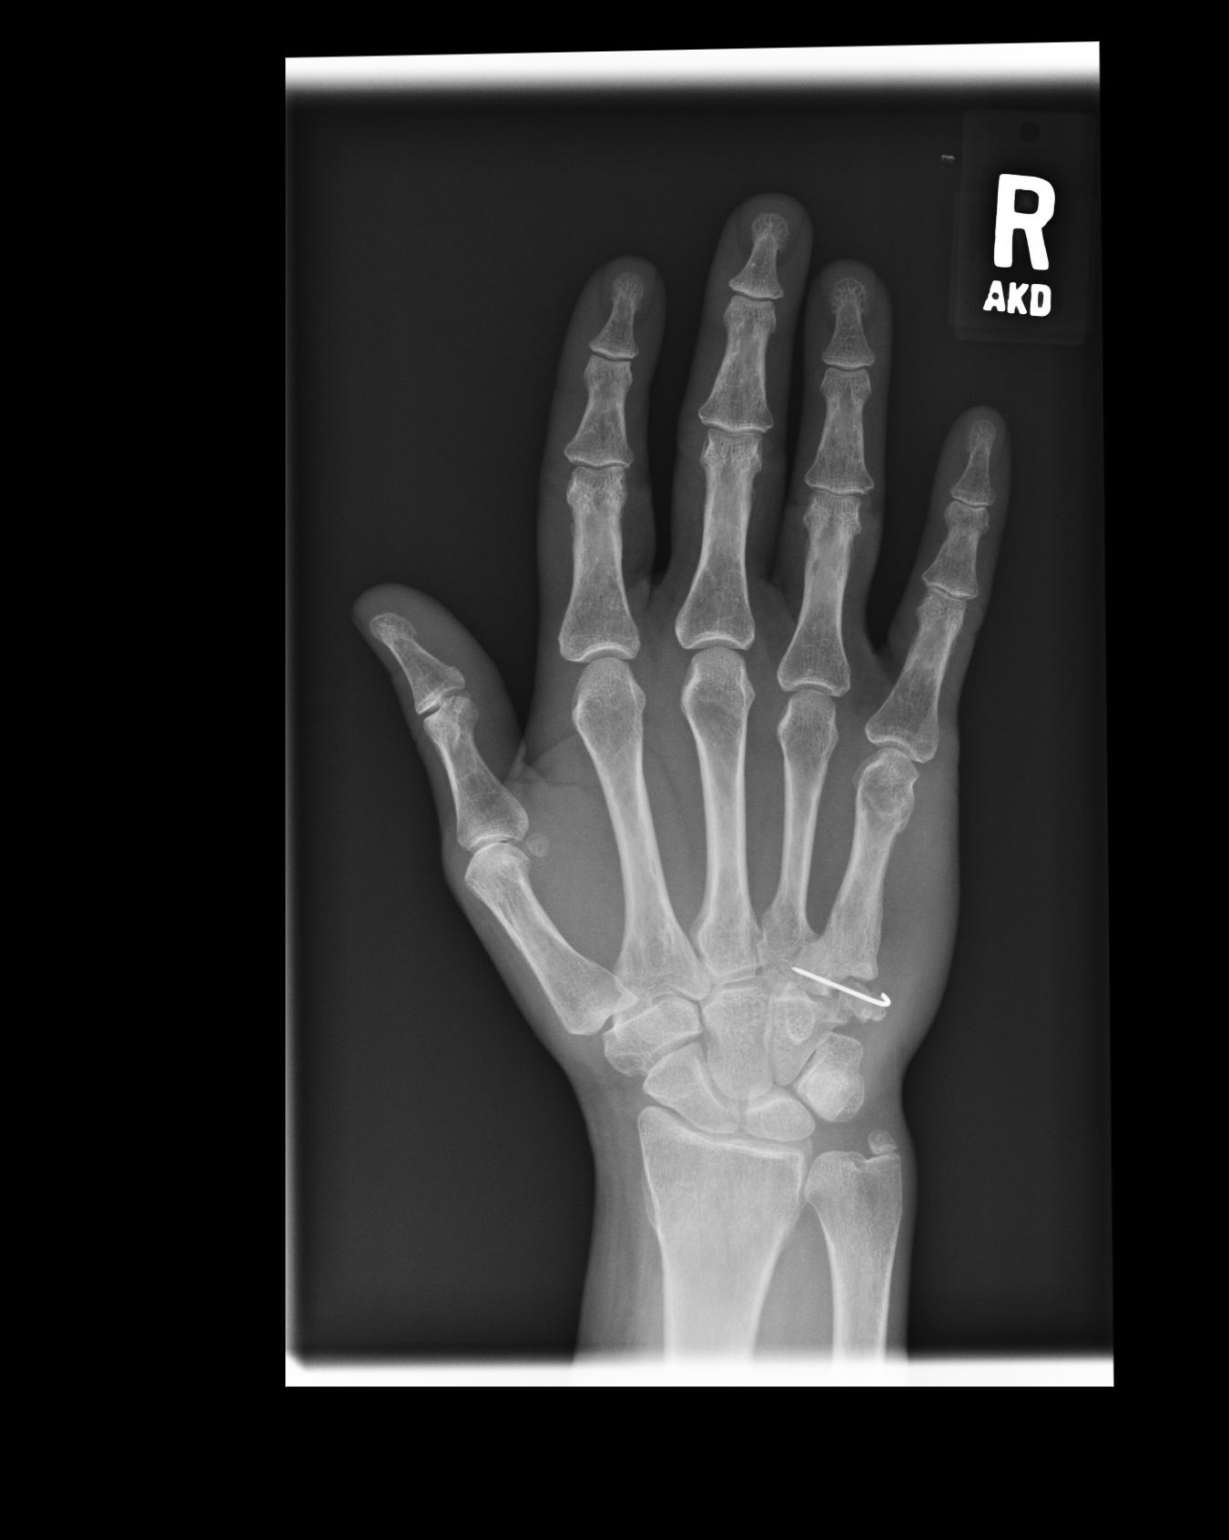
[im 2/3]
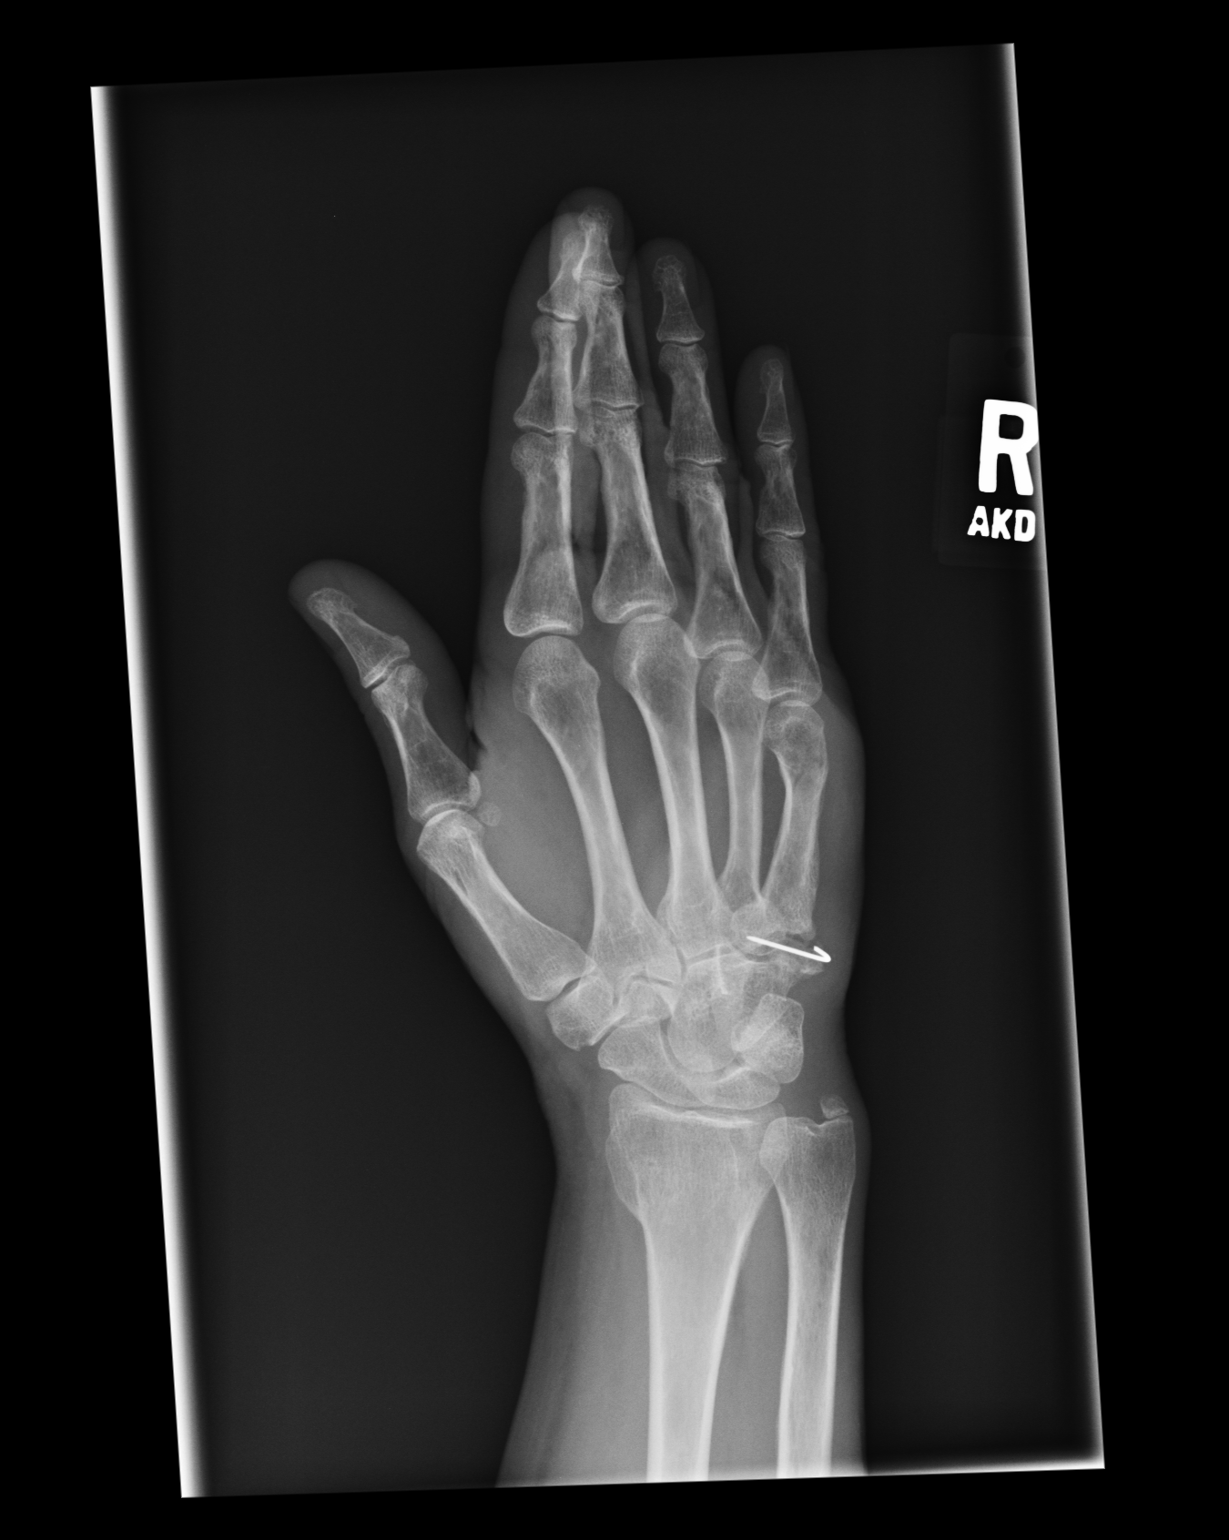
[im 3/3]
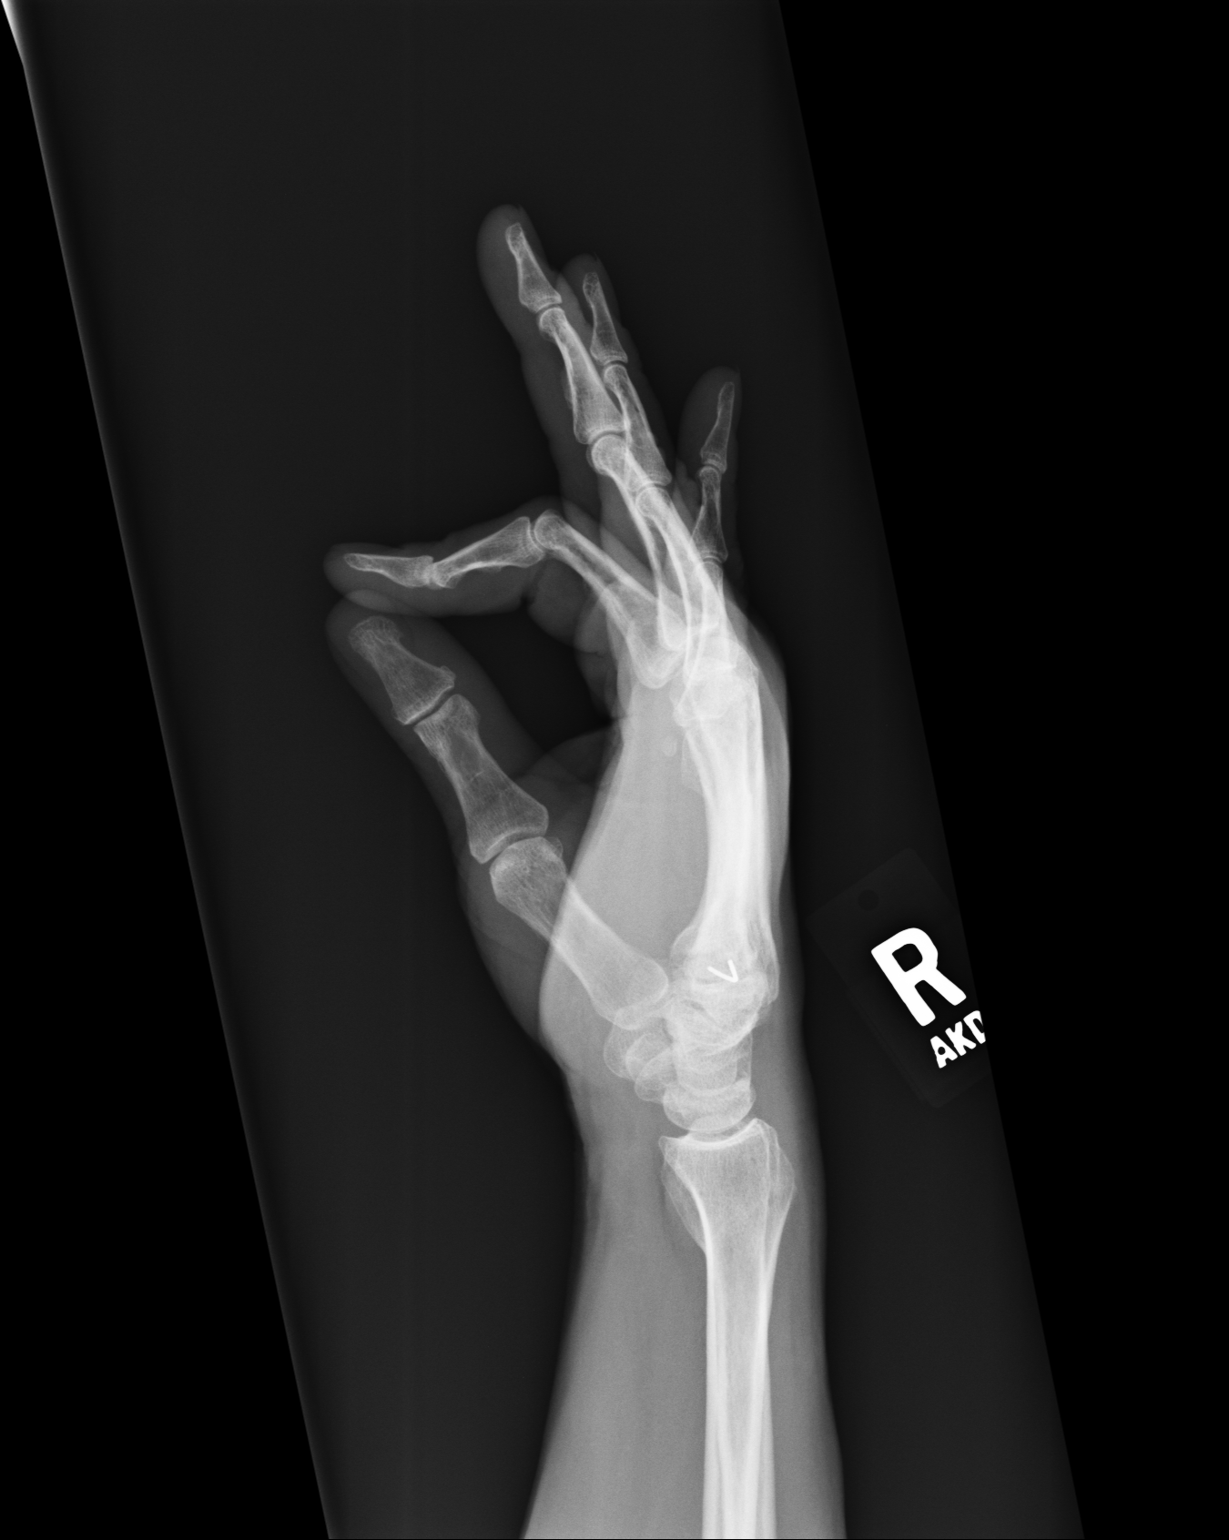

[3 of 3 positions shown; findings below may reference images not displayed]

FINDINGS: Three views of the RIGHT hand are provided.  There is no
comparison.

There is an old RIGHT ulnar styloid fracture.  There is a comminuted
fracture at the base of the RIGHT fifth metacarpal transfixed with a K-wire.
The fracture is ununited.  There are no other fractures identified. There is
no dislocation.  The soft tissues are unremarkable.
IMPRESSION: 1. Ununited, comminuted fracture at the base of the RIGHT fifth metacarpal.

## 2011-01-08 IMAGING — CR RIGHT HAND - COMPLETE 3+ VIEW
1 series · 3 of 3 positions shown · non-contrast
Comparison: none

REASON FOR EXAM: Pain - CALL REPORT
COMMENTS:

[Series 2: view not recorded · 0.17mm/px · 3 of 3 slices shown]
[im 1/3]
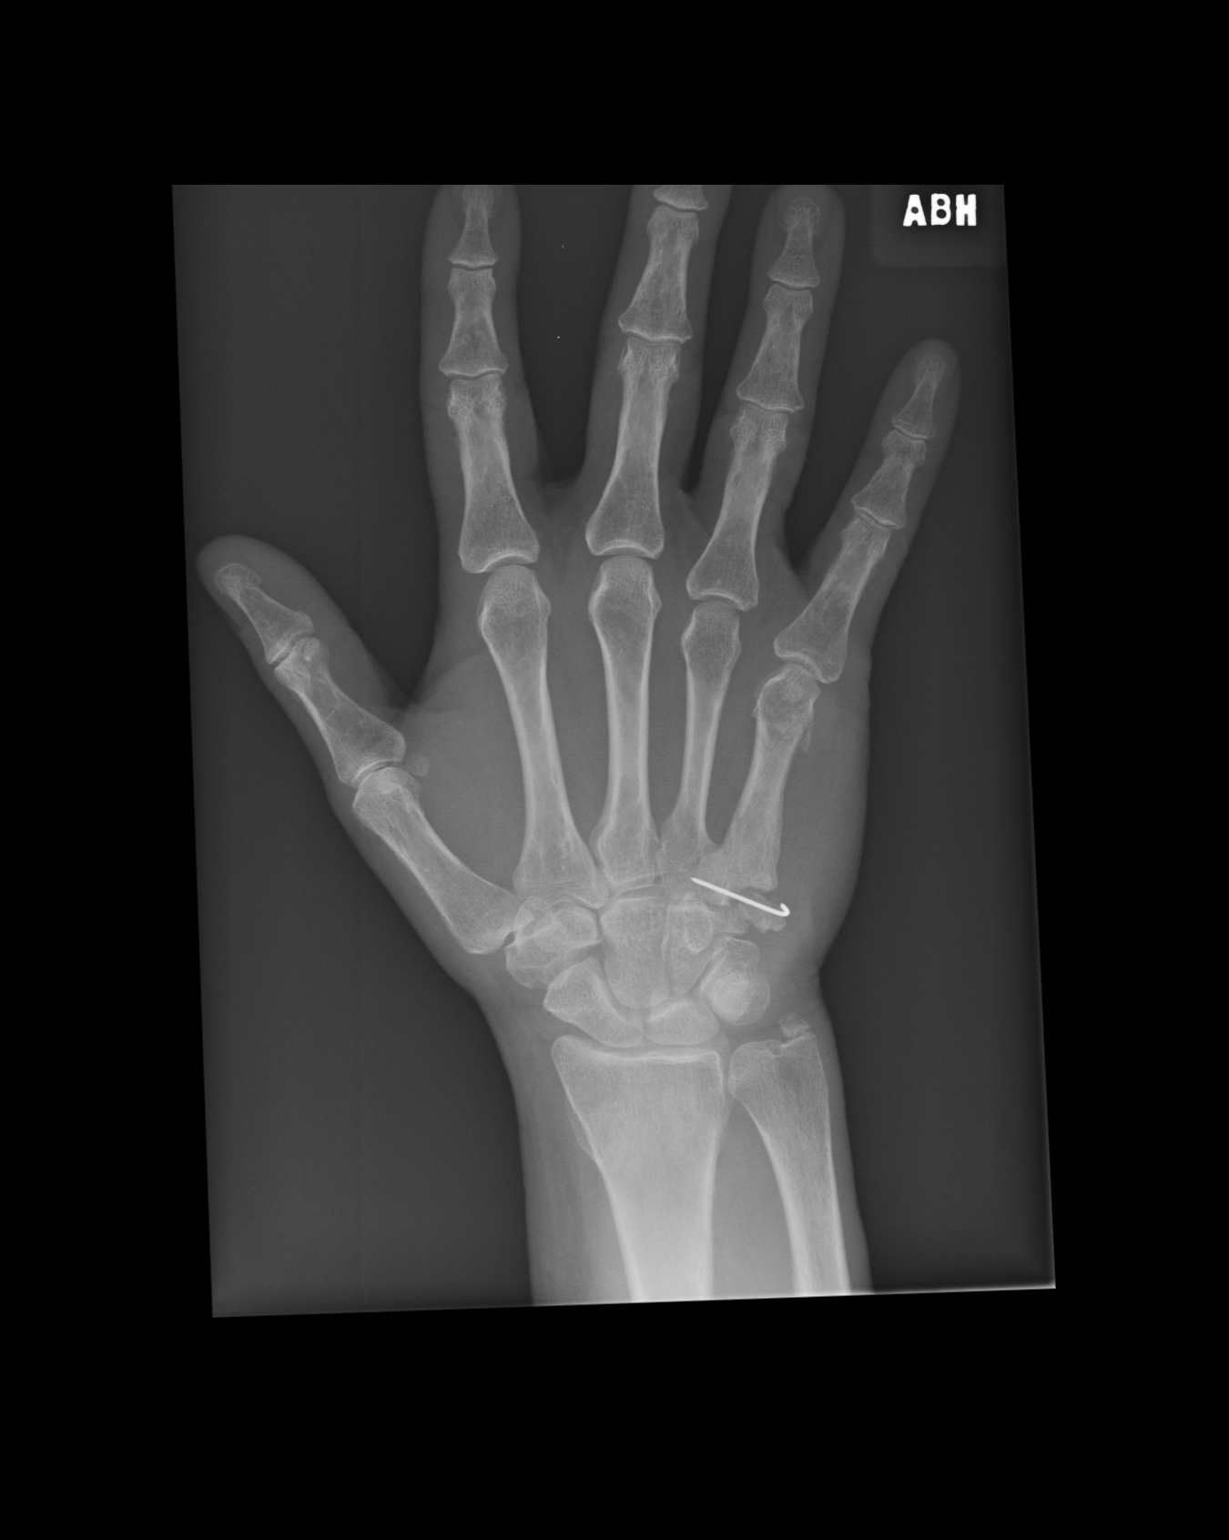
[im 2/3]
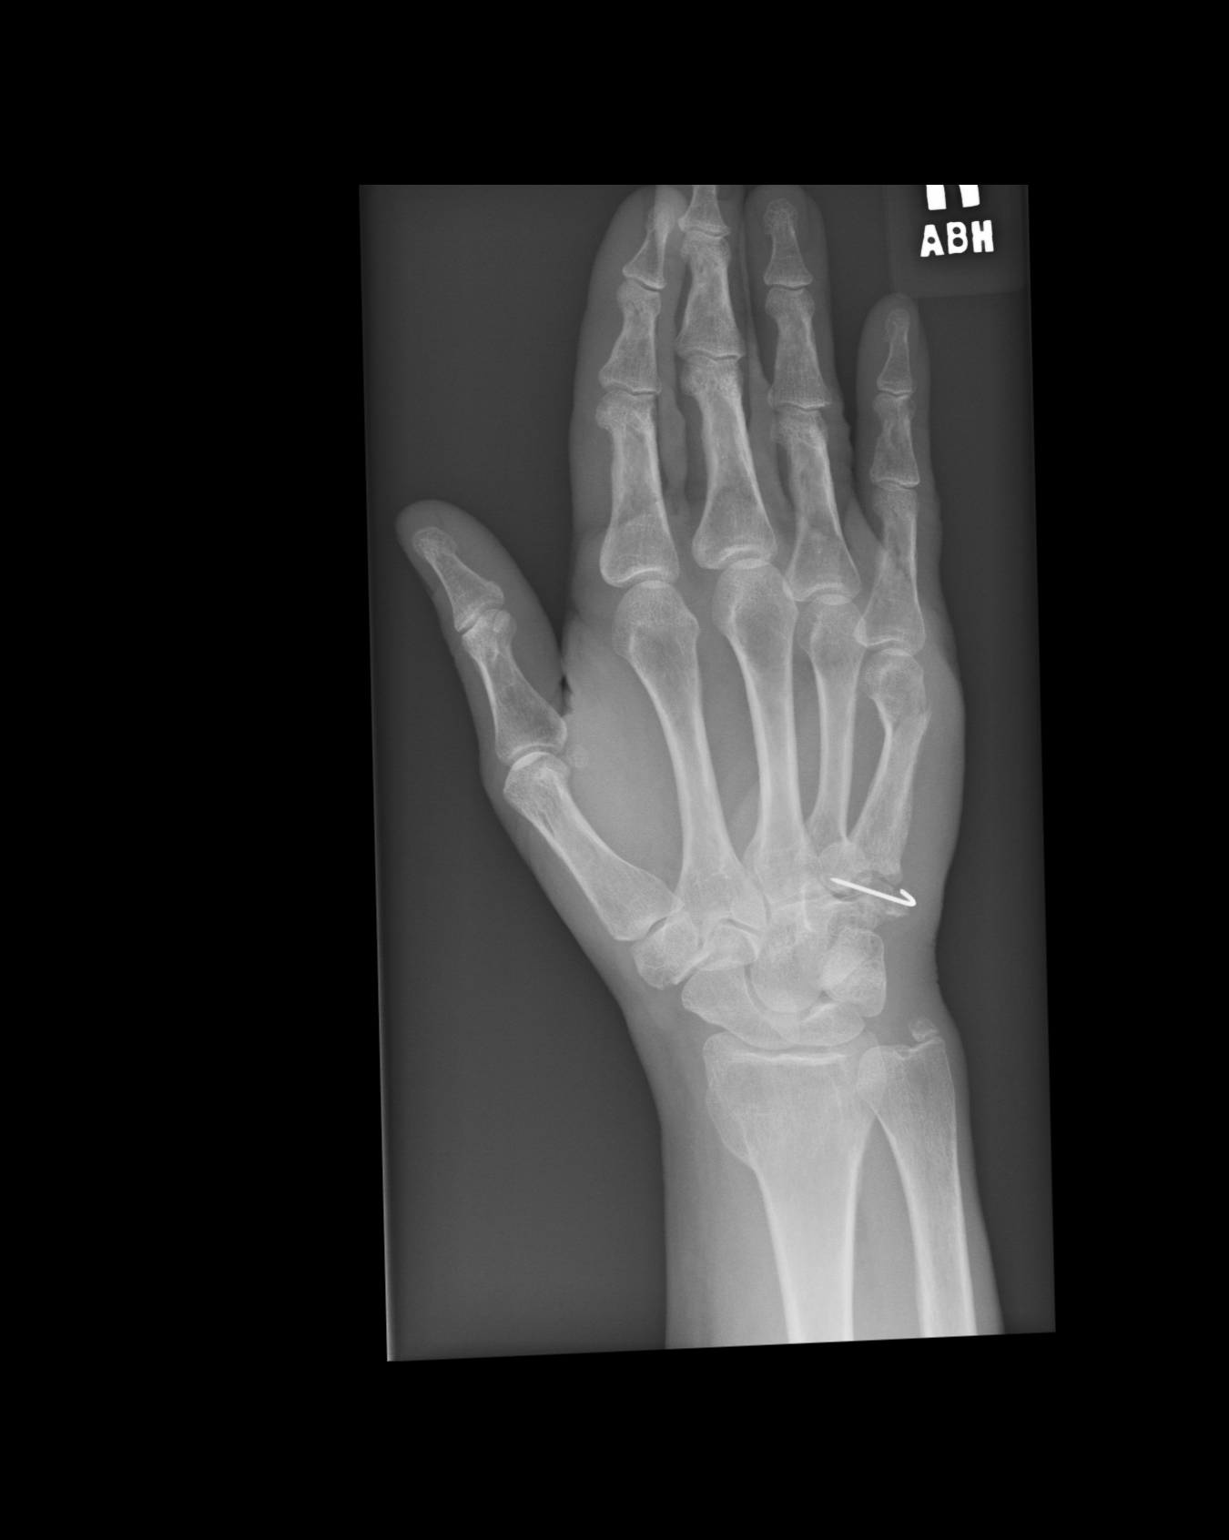
[im 3/3]
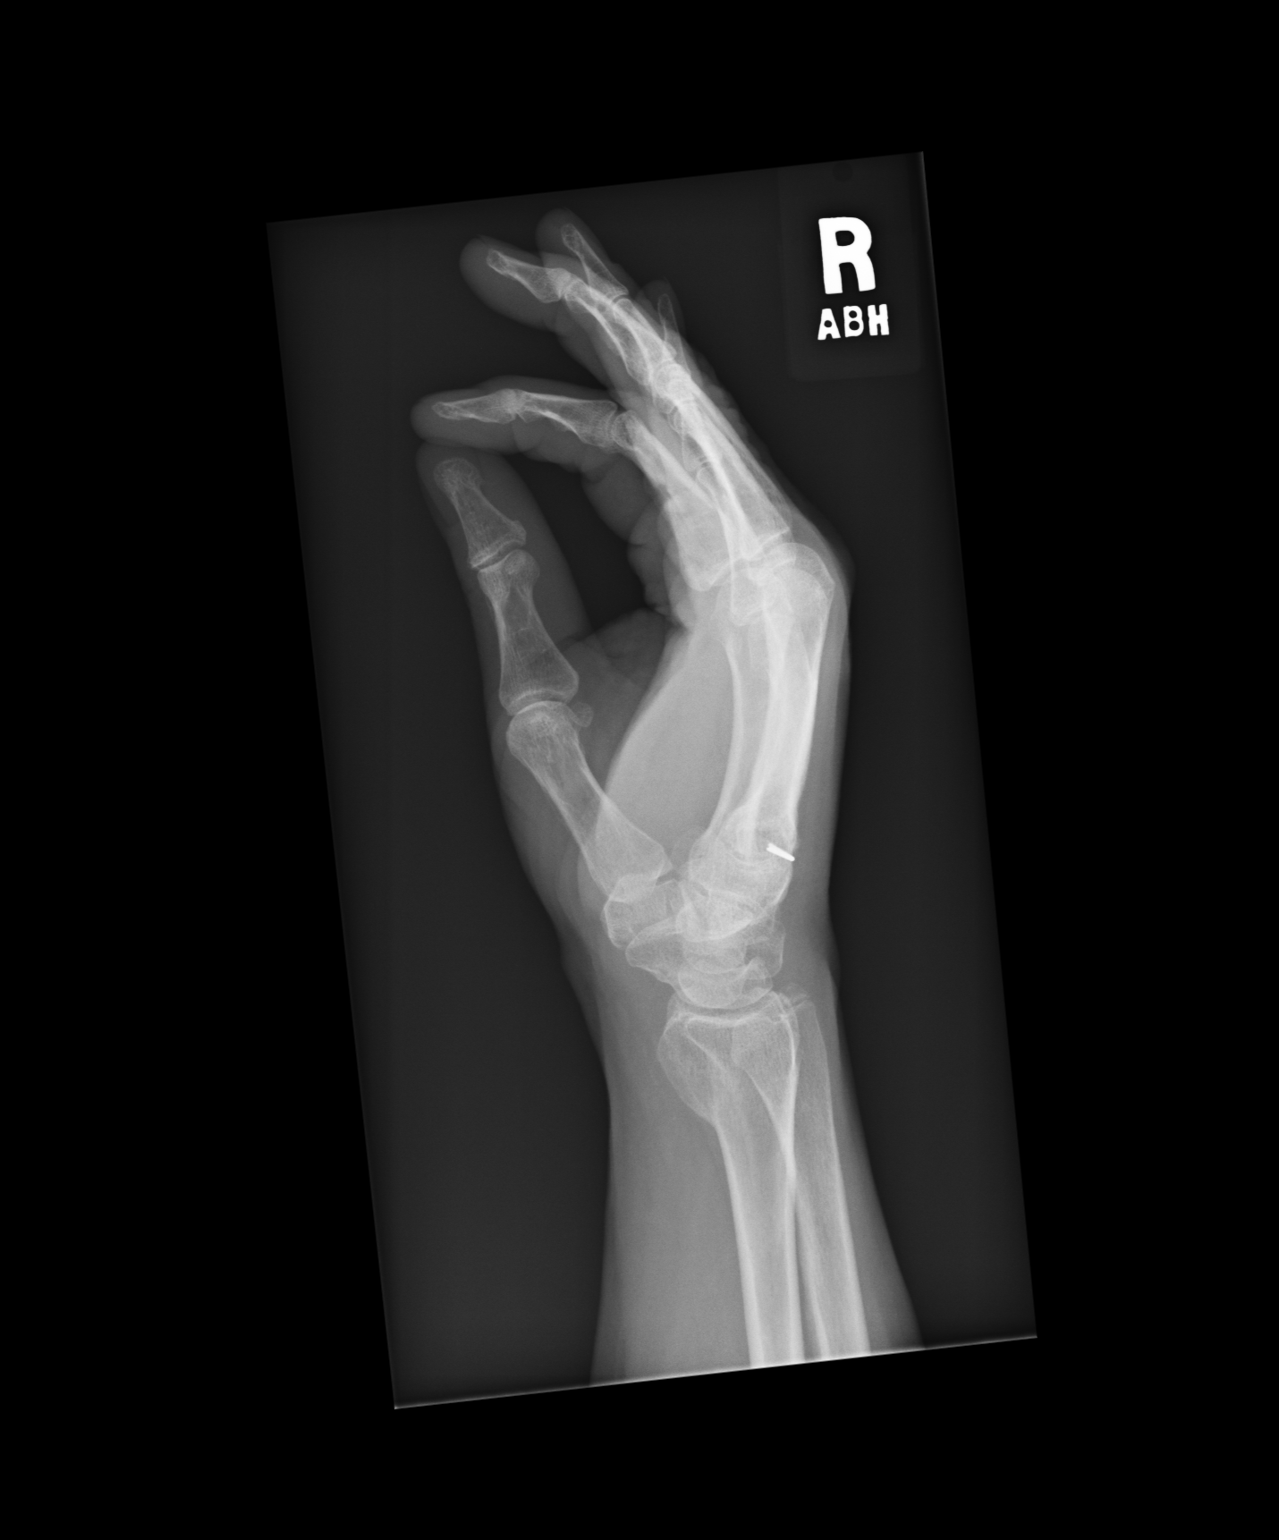

[3 of 3 positions shown; findings below may reference images not displayed]

PROCEDURE:     KDR - KDXR HAND RT COMPLETE W/OBLIQUES  - March 11, 2009 [DATE]

RESULT:     Images of the right hand compare to the study of 02/28/2009.

There is an the base of the right fifth metacarpal. There is a fracture in
the distal portion of the right fifth metacarpal with posterior angulation
consistent with a boxer's type fracture. No additional fracture is seen.
IMPRESSION: Boxer's fracture in the distal right fifth metacarpal with
dorsal angulation. No significant comminution is seen. Previous fracture at
the base of the right fifth metacarpal with a K wire present as described
previously.

## 2011-01-20 IMAGING — US US RENAL KIDNEY
1 series · 17 of 21 positions shown · non-contrast
Comparison: none

REASON FOR EXAM: UTI MICROHEMATURIA
COMMENTS:

[Series 1: us renal kidney · 17 of 21 slices shown]
[im 1/21]
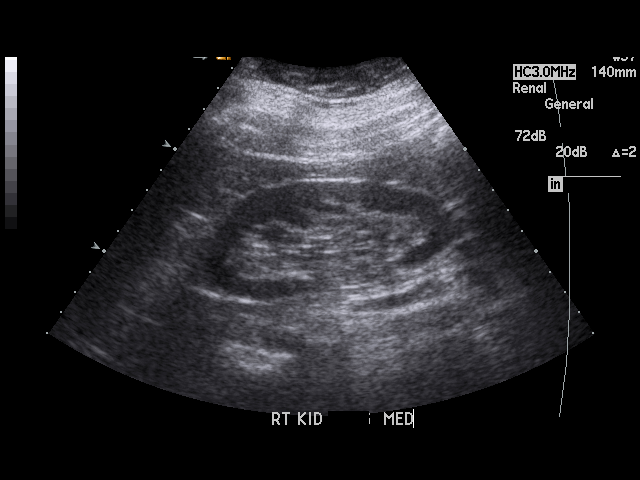
[im 2/21]
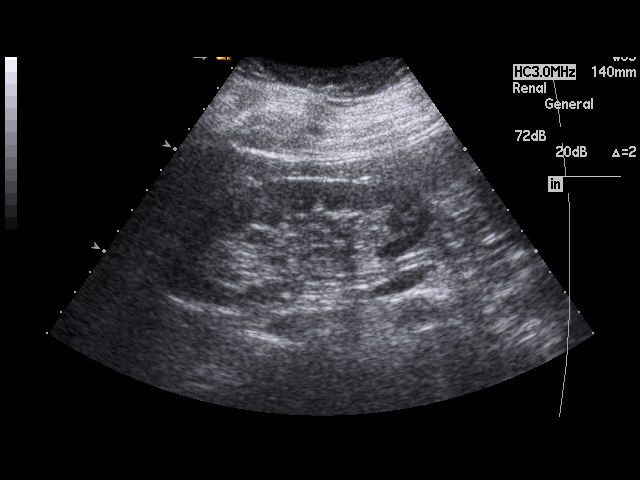
[im 4/21]
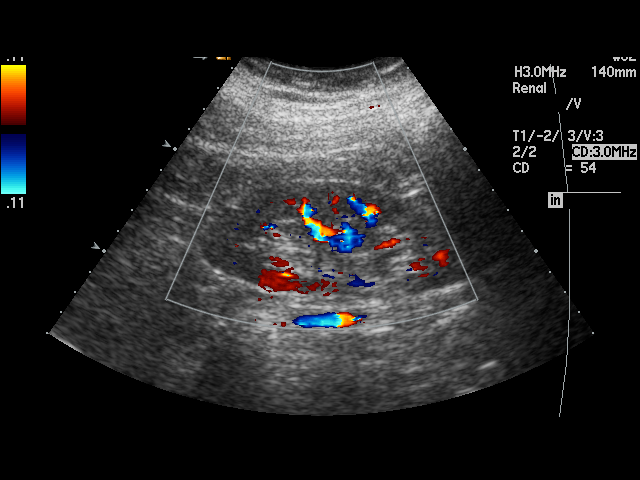
[im 5/21]
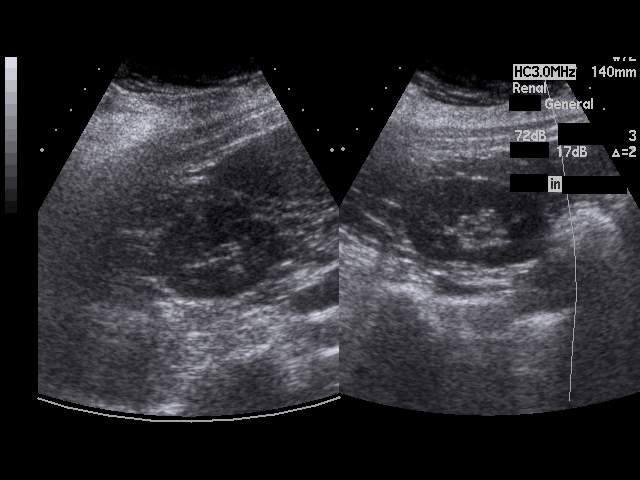
[im 6/21]
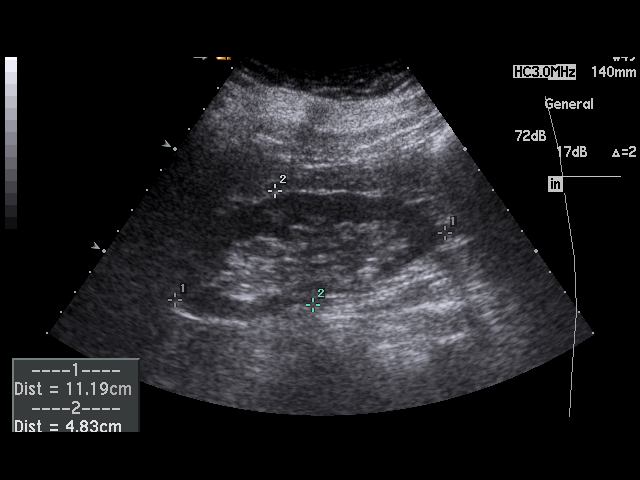
[im 7/21]
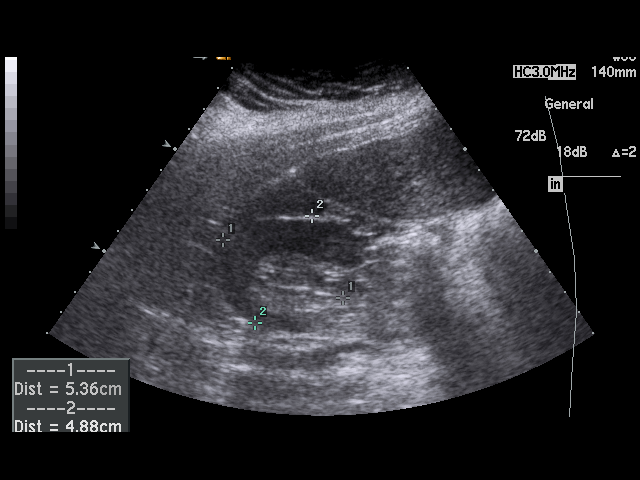
[im 9/21]
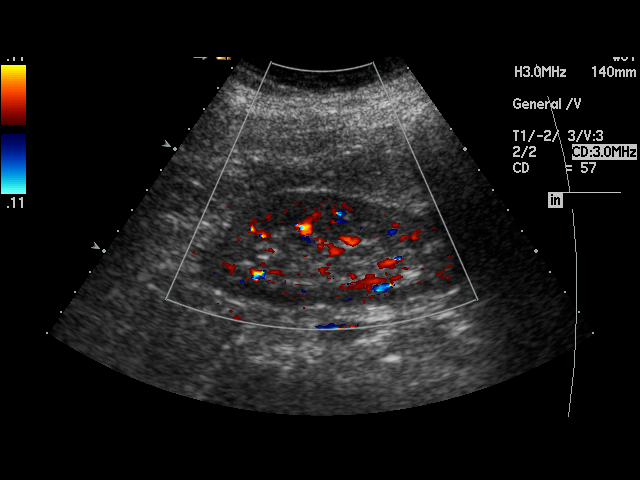
[im 10/21]
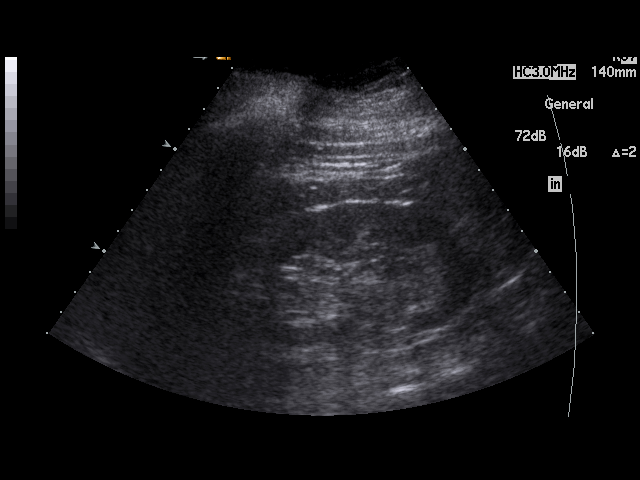
[im 11/21]
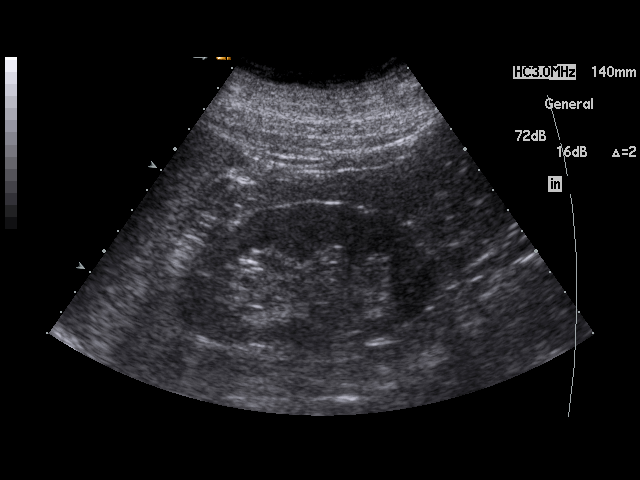
[im 12/21]
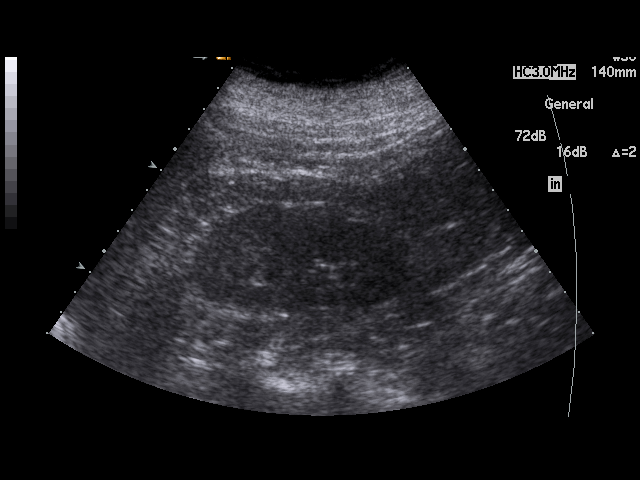
[im 13/21]
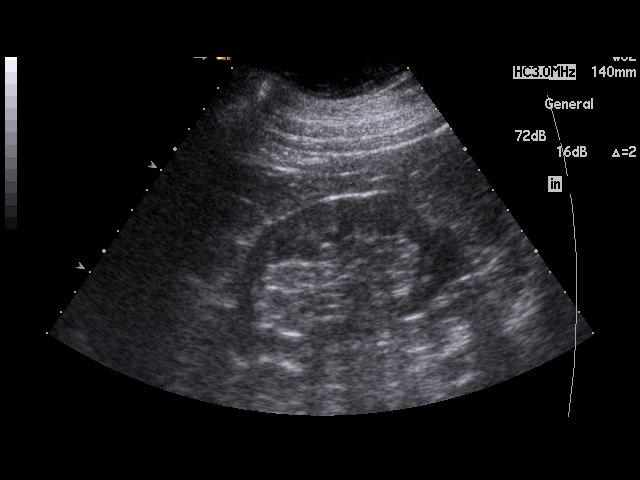
[im 15/21]
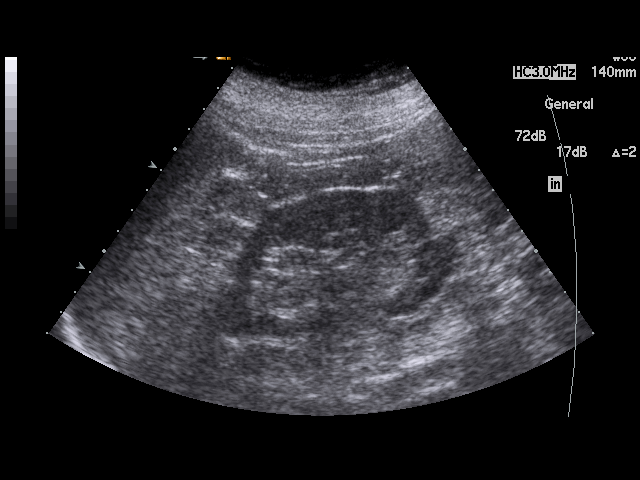
[im 16/21]
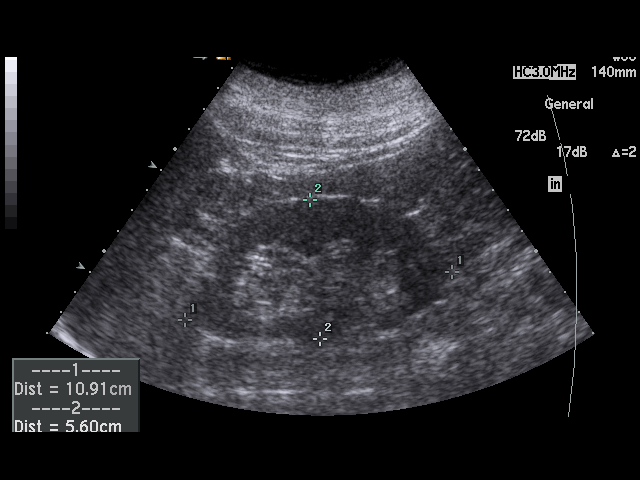
[im 17/21]
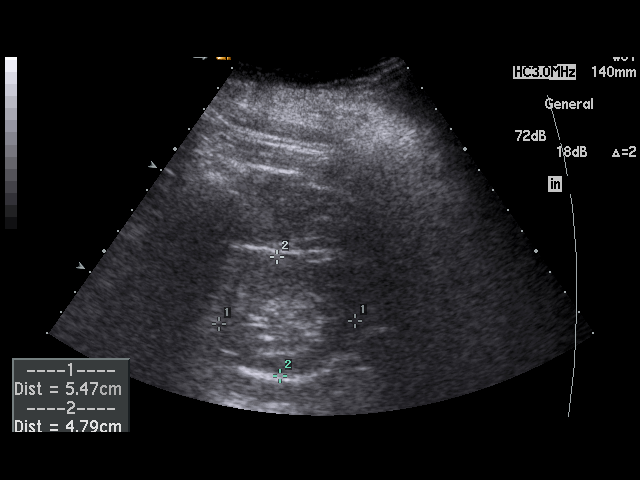
[im 18/21]
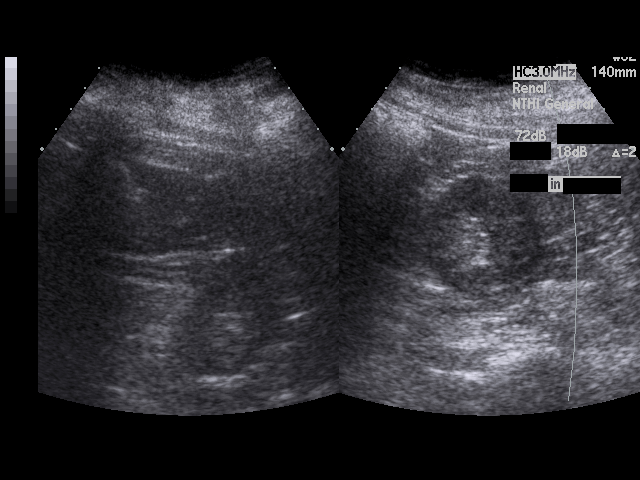
[im 20/21]
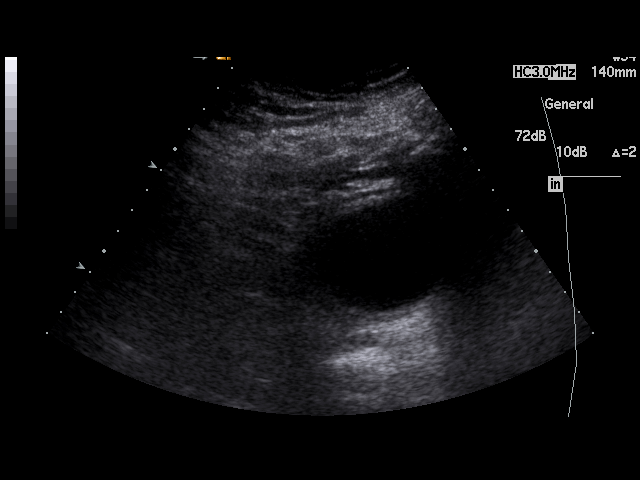
[im 21/21]
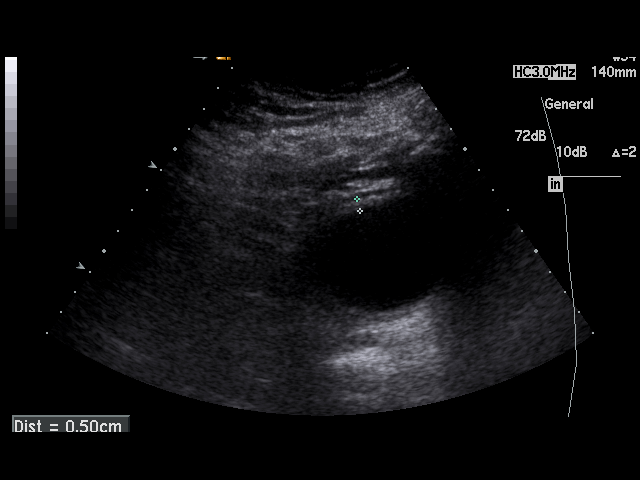

[17 of 21 positions shown; findings below may reference images not displayed]

PROCEDURE:     US  - US KIDNEY  - March 23, 2009  [DATE]

RESULT:     The right kidney measures 11.19 cm x 5.3 cm x 4.8 cm and the
left kidney measures 10.9 cm x 5.4 cm x 4.7 cm. The renal cortical margins
are smooth. No definite renal cortical thickening is seen. No renal mass
lesions are noted. There is no hydronephrosis. No renal calcifications are
identified. The wall of the urinary bladder measures 5 mm in thickness and
appears slightly thickened.
IMPRESSION: 1. The kidneys are normal in appearance sonographically.
2. There is a slightly thickened appearance to the urinary bladder wall.

## 2011-03-03 IMAGING — MR MRI HEAD WITHOUT CONTRAST
7 series · 48 of 48 positions shown · non-contrast
Comparison: none

REASON FOR EXAM: left side HA   HX multilple CVA's    dizziness  double
vision   CALL report ...
COMMENTS:

[Series 2: t1_sag · axial · 10.0mm · 0.55mm/px · z∈[+0,+100]mm · 5 of 27 slices shown]
[im 1/27]
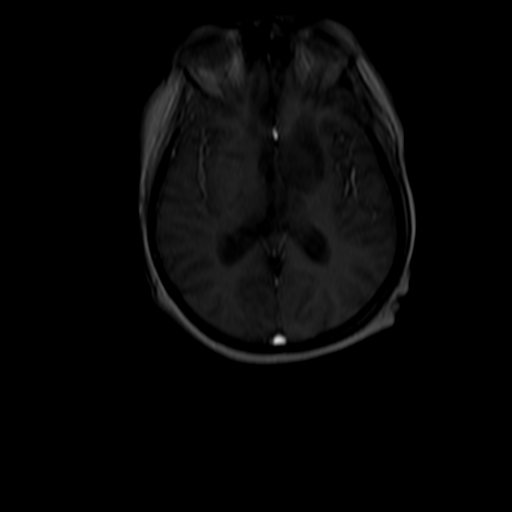
[im 7/27]
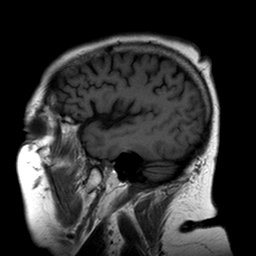
[im 14/27]
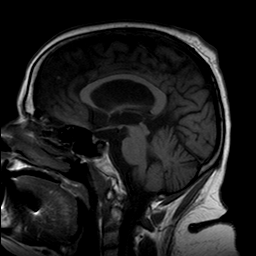
[im 20/27]
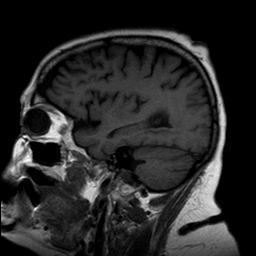
[im 27/27]
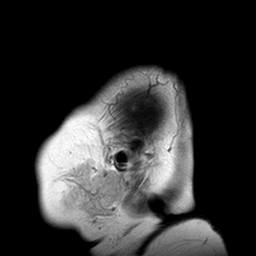

[Series 5: t1_axial_thin slices · axial · 4.0mm · 0.90mm/px · z∈[-74,+100]mm · 8 of 35 slices shown]
[im 1/35]
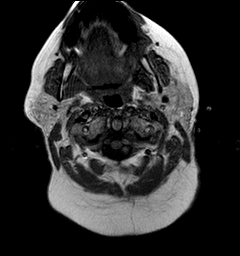
[im 5/35]
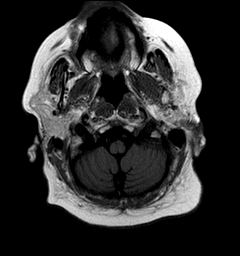
[im 10/35]
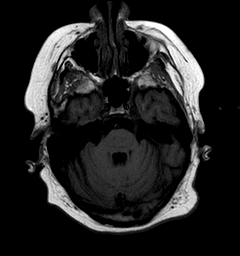
[im 15/35]
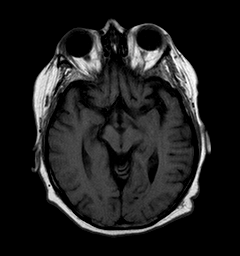
[im 20/35]
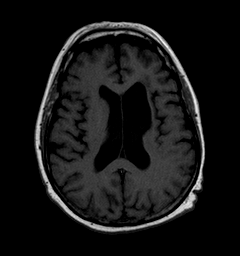
[im 25/35]
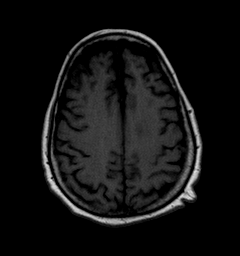
[im 30/35]
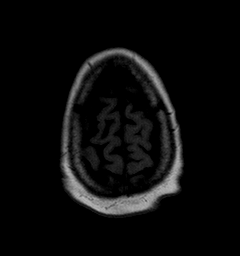
[im 35/35]
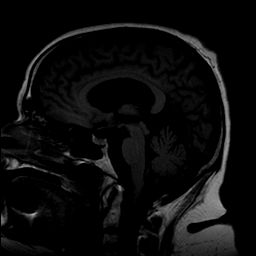

[Series 12: T2 · axial · 4.0mm · 0.45mm/px · z∈[-74,+100]mm · 8 of 34 slices shown]
[im 1/34]
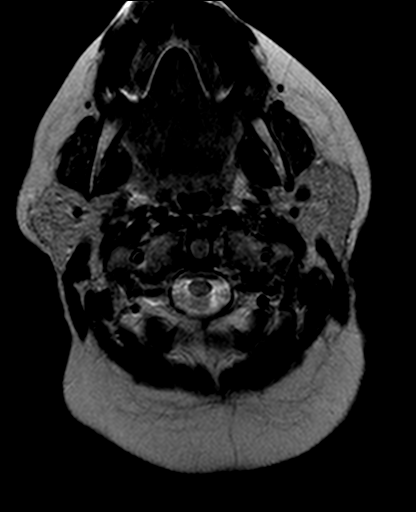
[im 5/34]
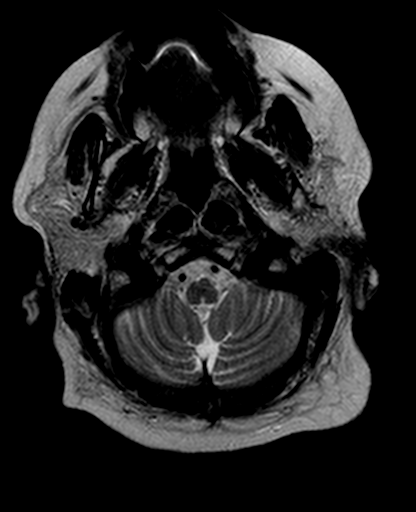
[im 10/34]
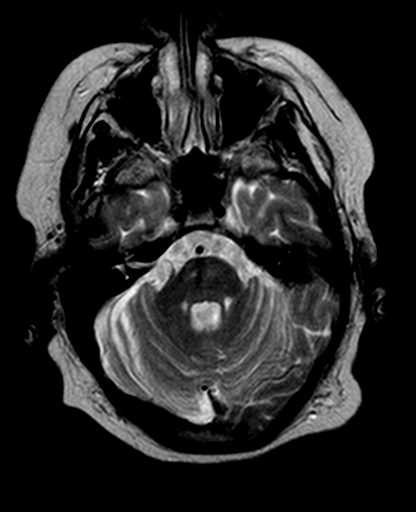
[im 15/34]
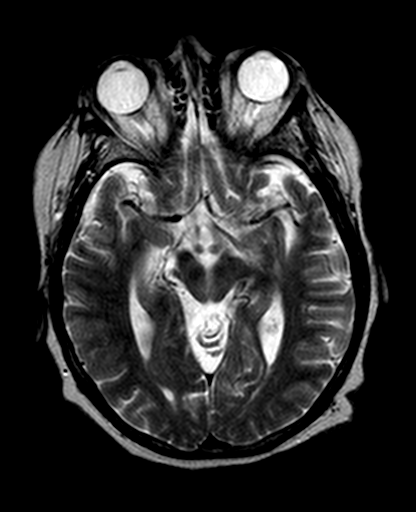
[im 19/34]
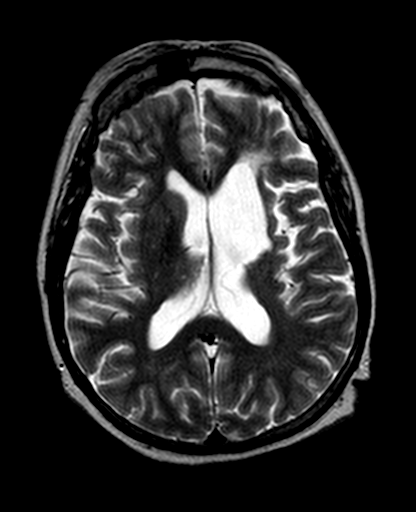
[im 24/34]
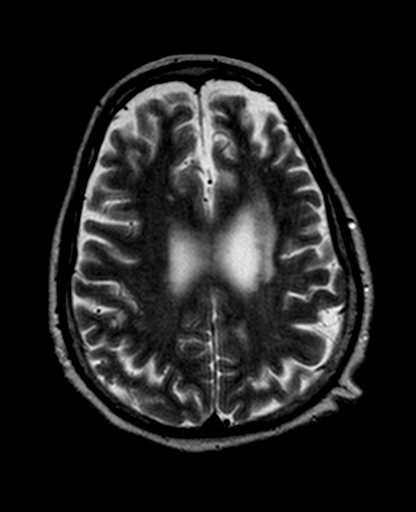
[im 29/34]
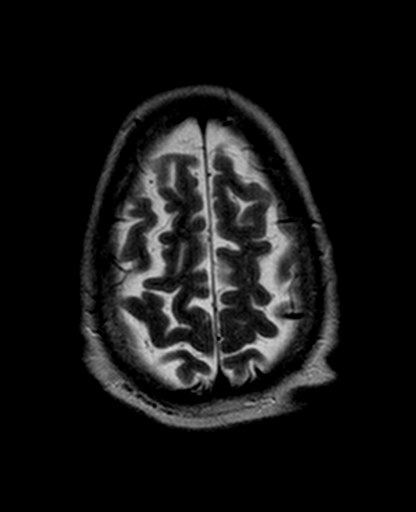
[im 34/34]
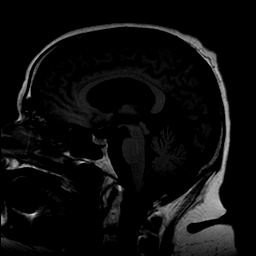

[Series 14: FLAIR · axial · 5.0mm · 0.90mm/px · z∈[-69,+100]mm · 6 of 24 slices shown]
[im 1/24]
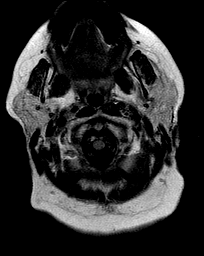
[im 5/24]
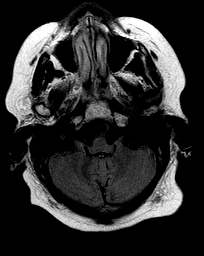
[im 10/24]
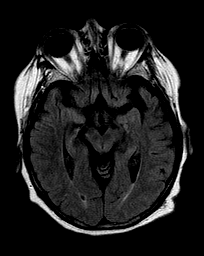
[im 14/24]
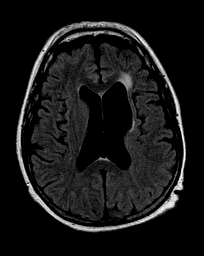
[im 19/24]
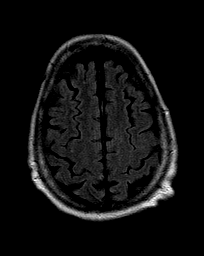
[im 24/24]
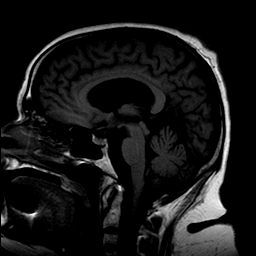

[Series 16: t1_coronal_thin slices · coronal · 4.0mm · 0.90mm/px · 9 of 38 slices shown]
[im 1/38]
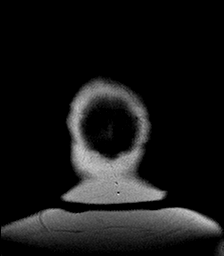
[im 5/38]
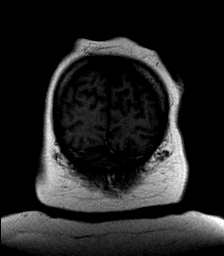
[im 10/38]
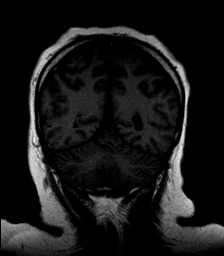
[im 14/38]
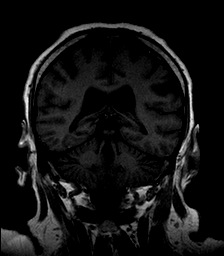
[im 19/38]
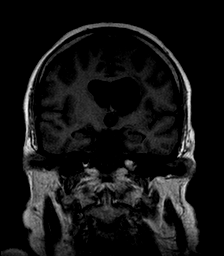
[im 24/38]
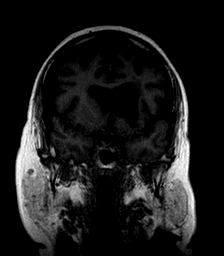
[im 28/38]
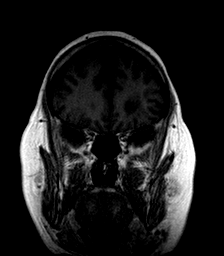
[im 33/38]
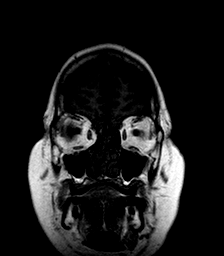
[im 38/38]
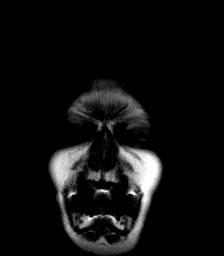

[Series 5003: DWI · axial · 5.0mm · 1.80mm/px · z∈[-72,+100]mm · 6 of 24 slices shown]
[im 1/24]
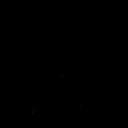
[im 5/24]
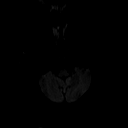
[im 10/24]
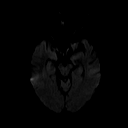
[im 14/24]
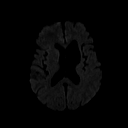
[im 19/24]
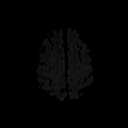
[im 24/24]
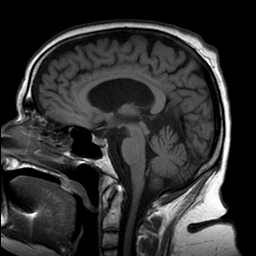

[Series 5005: ADC · axial · 5.0mm · 1.80mm/px · z∈[-72,+100]mm · 6 of 24 slices shown]
[im 1/24]
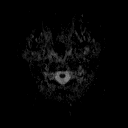
[im 5/24]
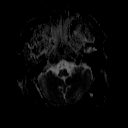
[im 10/24]
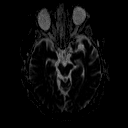
[im 14/24]
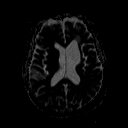
[im 19/24]
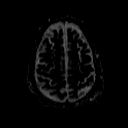
[im 24/24]
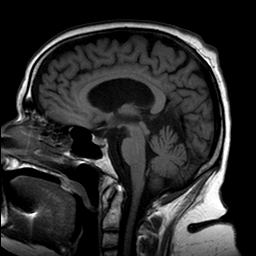

[48 of 48 positions shown; findings below may reference images not displayed]

PROCEDURE:     MR  - MR BRAIN WO CONTRAST  - May 04, 2009 [DATE]

RESULT:     History: Left-sided weakness and double vision.

Comparison Study: Head CT of 06/18/2008 and MRI the brain of 03/20/2007.

Procedure and Findings: Multiplanar, multisequence imaging of the brain is
obtained. Diffusion-weighted images reveal no evidence of acute ischemia.
Deep white matter changes noted consistent with chronic ischemia. Vascular
flow voids are normal. Ectasia of the left lateral horn of the lateral
ventricle noted this is consistent degree of adjacent atrophy.
IMPRESSION: No acute or focal abnormality. Diffuse atrophy present.

## 2011-04-14 ENCOUNTER — Ambulatory Visit: Payer: Self-pay | Admitting: Family Medicine

## 2011-04-18 LAB — GLUCOSE, CAPILLARY
Glucose-Capillary: 71
Glucose-Capillary: 109 — ABNORMAL HIGH
Glucose-Capillary: 112 — ABNORMAL HIGH
Glucose-Capillary: 117 — ABNORMAL HIGH
Glucose-Capillary: 120 — ABNORMAL HIGH
Glucose-Capillary: 124 — ABNORMAL HIGH
Glucose-Capillary: 133 — ABNORMAL HIGH
Glucose-Capillary: 141 — ABNORMAL HIGH
Glucose-Capillary: 143 — ABNORMAL HIGH
Glucose-Capillary: 147 — ABNORMAL HIGH
Glucose-Capillary: 153 — ABNORMAL HIGH
Glucose-Capillary: 156 — ABNORMAL HIGH
Glucose-Capillary: 156 — ABNORMAL HIGH
Glucose-Capillary: 161 — ABNORMAL HIGH
Glucose-Capillary: 171 — ABNORMAL HIGH
Glucose-Capillary: 176 — ABNORMAL HIGH
Glucose-Capillary: 196 — ABNORMAL HIGH
Glucose-Capillary: 199 — ABNORMAL HIGH
Glucose-Capillary: 199 — ABNORMAL HIGH
Glucose-Capillary: 211 — ABNORMAL HIGH
Glucose-Capillary: 236 — ABNORMAL HIGH

## 2011-04-18 LAB — BASIC METABOLIC PANEL
BUN: 22
CO2: 20
CO2: 25
Calcium: 7.5 — ABNORMAL LOW
Calcium: 8.2 — ABNORMAL LOW
Calcium: 9.5
Chloride: 106
Creatinine, Ser: 0.92
GFR calc Af Amer: >60
GFR calc non Af Amer: 53 — ABNORMAL LOW
GFR calc non Af Amer: >60
Glucose, Bld: 136 — ABNORMAL HIGH
Potassium: 3.7
Sodium: 135

## 2011-04-18 LAB — HEPATIC FUNCTION PANEL
ALT: 22
AST: 26
Albumin: 3.8
Total Protein: 5.9 — ABNORMAL LOW

## 2011-04-18 LAB — CBC
HCT: 35 — ABNORMAL LOW
Hemoglobin: 8.4 — ABNORMAL LOW
MCHC: 33
MCHC: 33.8
Platelets: 216
Platelets: 233
RBC: 2.75 — ABNORMAL LOW
RBC: 2.82 — ABNORMAL LOW
RBC: 3.18 — ABNORMAL LOW
RDW: 14
WBC: 6.1
WBC: 6.5
WBC: 7.7
WBC: 8.9

## 2011-04-18 LAB — ABO/RH: ABO/RH(D): B POS

## 2011-04-18 LAB — TYPE AND SCREEN: Antibody Screen: NEGATIVE

## 2011-04-20 IMAGING — CR DG RIBS 2V*L*
1 series · 4 of 4 positions shown · non-contrast
Comparison: none

REASON FOR EXAM: rib fracture
COMMENTS:

PROCEDURE:     KDR - KDXR RIBS LEFT UNILATERAL  - June 21, 2009  [DATE]
RESULT:     Images of the left ribs are underexposed. There does appear to
be a fracture in what appears to be the left seventh rib. There is no
pneumothorax. The eighth rib and more inferior ribs are poorly seen.

[Series 1: view not recorded · 0.17mm/px · 4 of 4 slices shown]
[im 1/4]
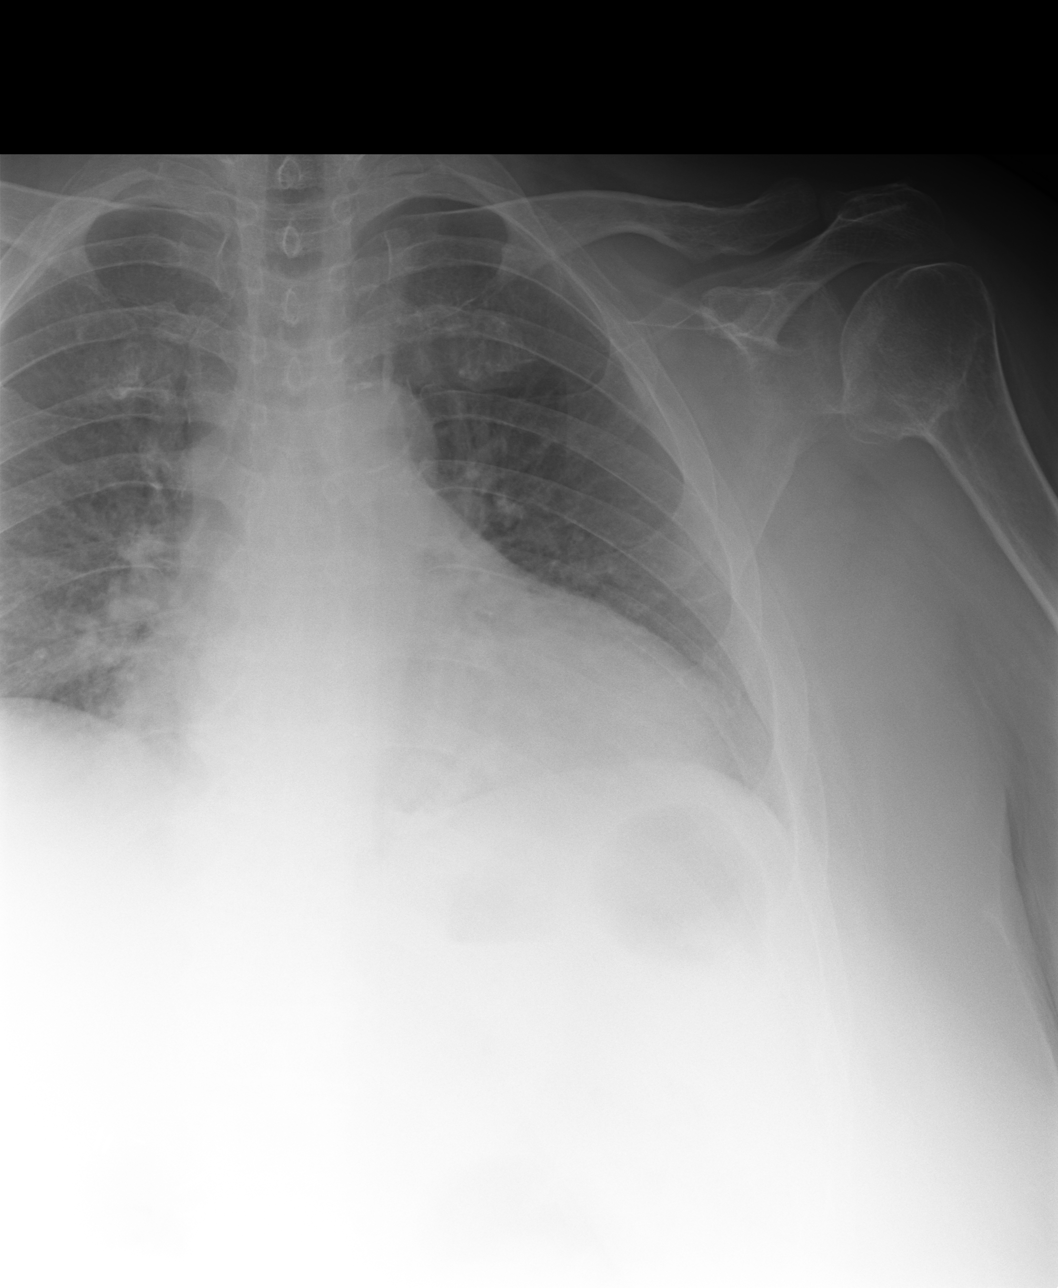
[im 2/4]
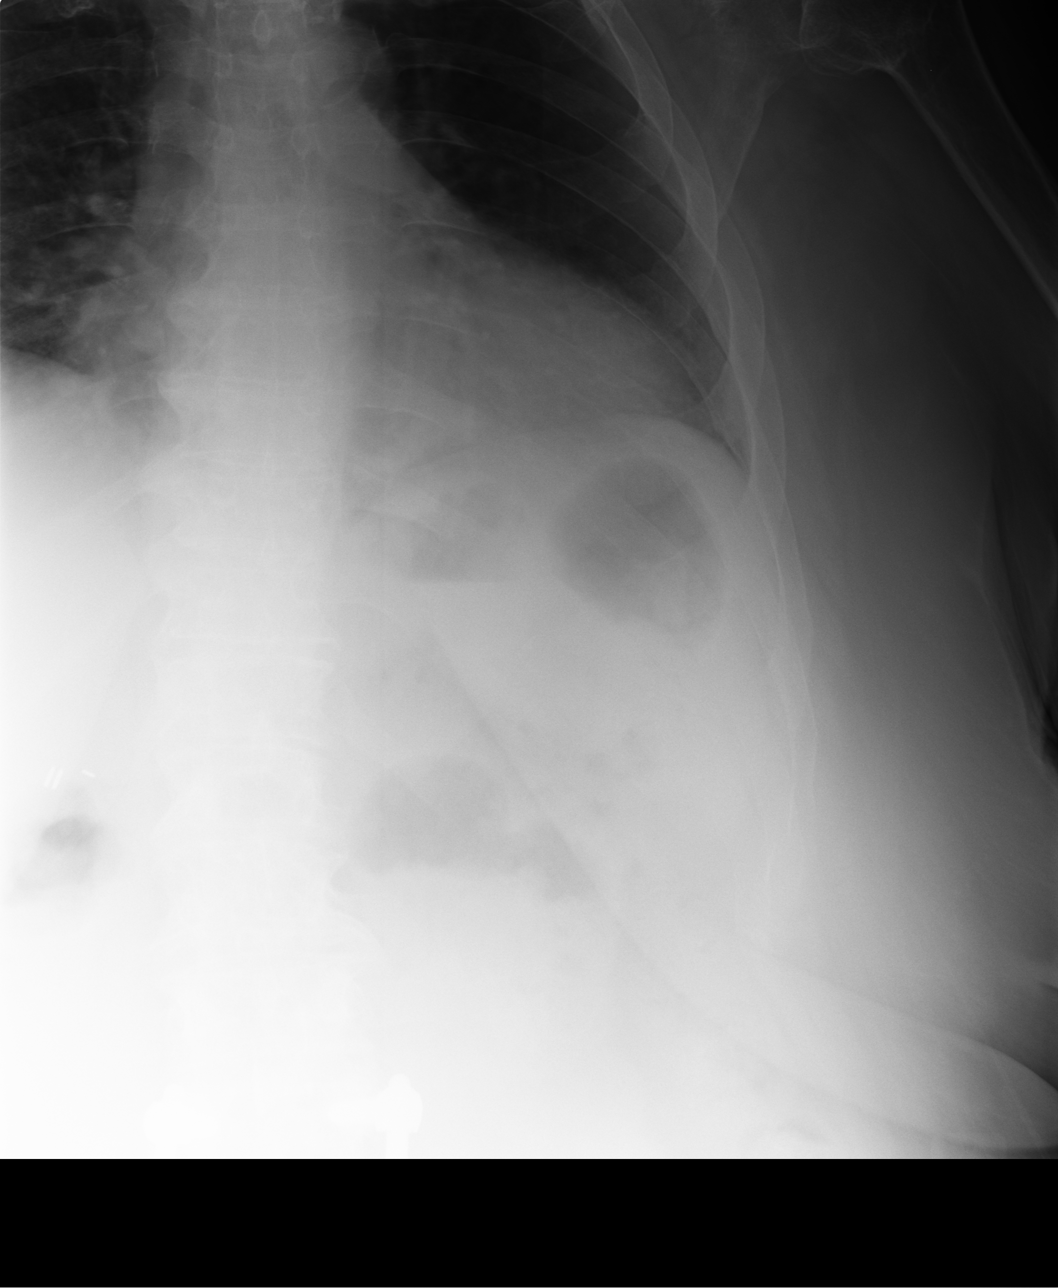
[im 3/4]
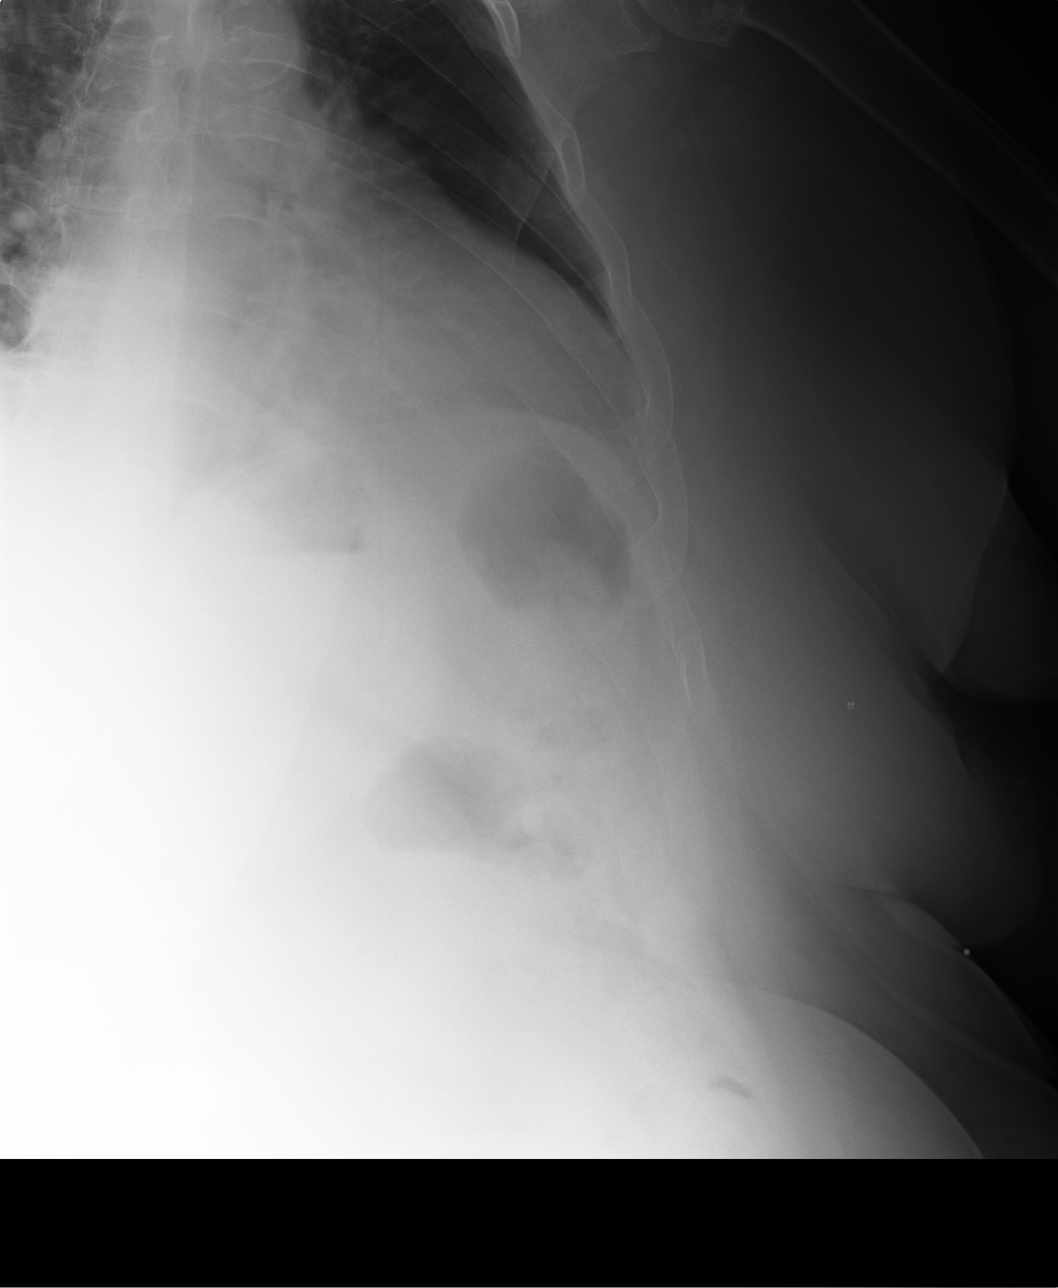
[im 4/4]
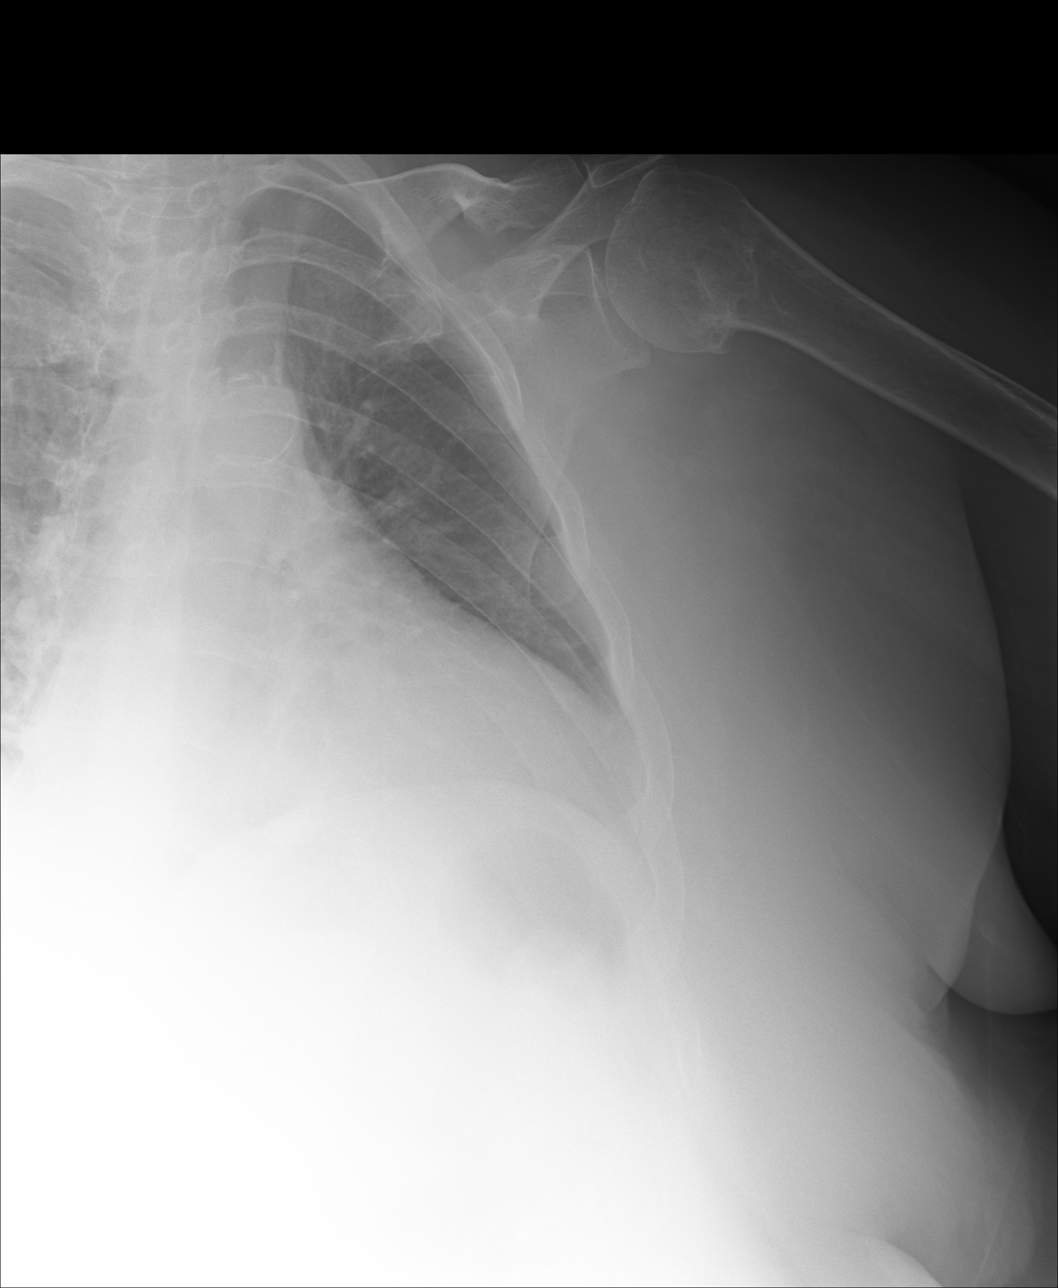

[4 of 4 positions shown; findings below may reference images not displayed]

IMPRESSION: Findings consistent with a left seventh rib fracture.

## 2011-09-29 ENCOUNTER — Ambulatory Visit: Payer: Self-pay | Admitting: Family Medicine

## 2011-10-30 ENCOUNTER — Ambulatory Visit: Payer: Self-pay | Admitting: Orthopedic Surgery

## 2011-11-17 IMAGING — CR DG CHEST 1V PORT
1 series · 1 of 1 positions shown · non-contrast
Comparison: none

REASON FOR EXAM: Chest Pain
COMMENTS:

[view not recorded]
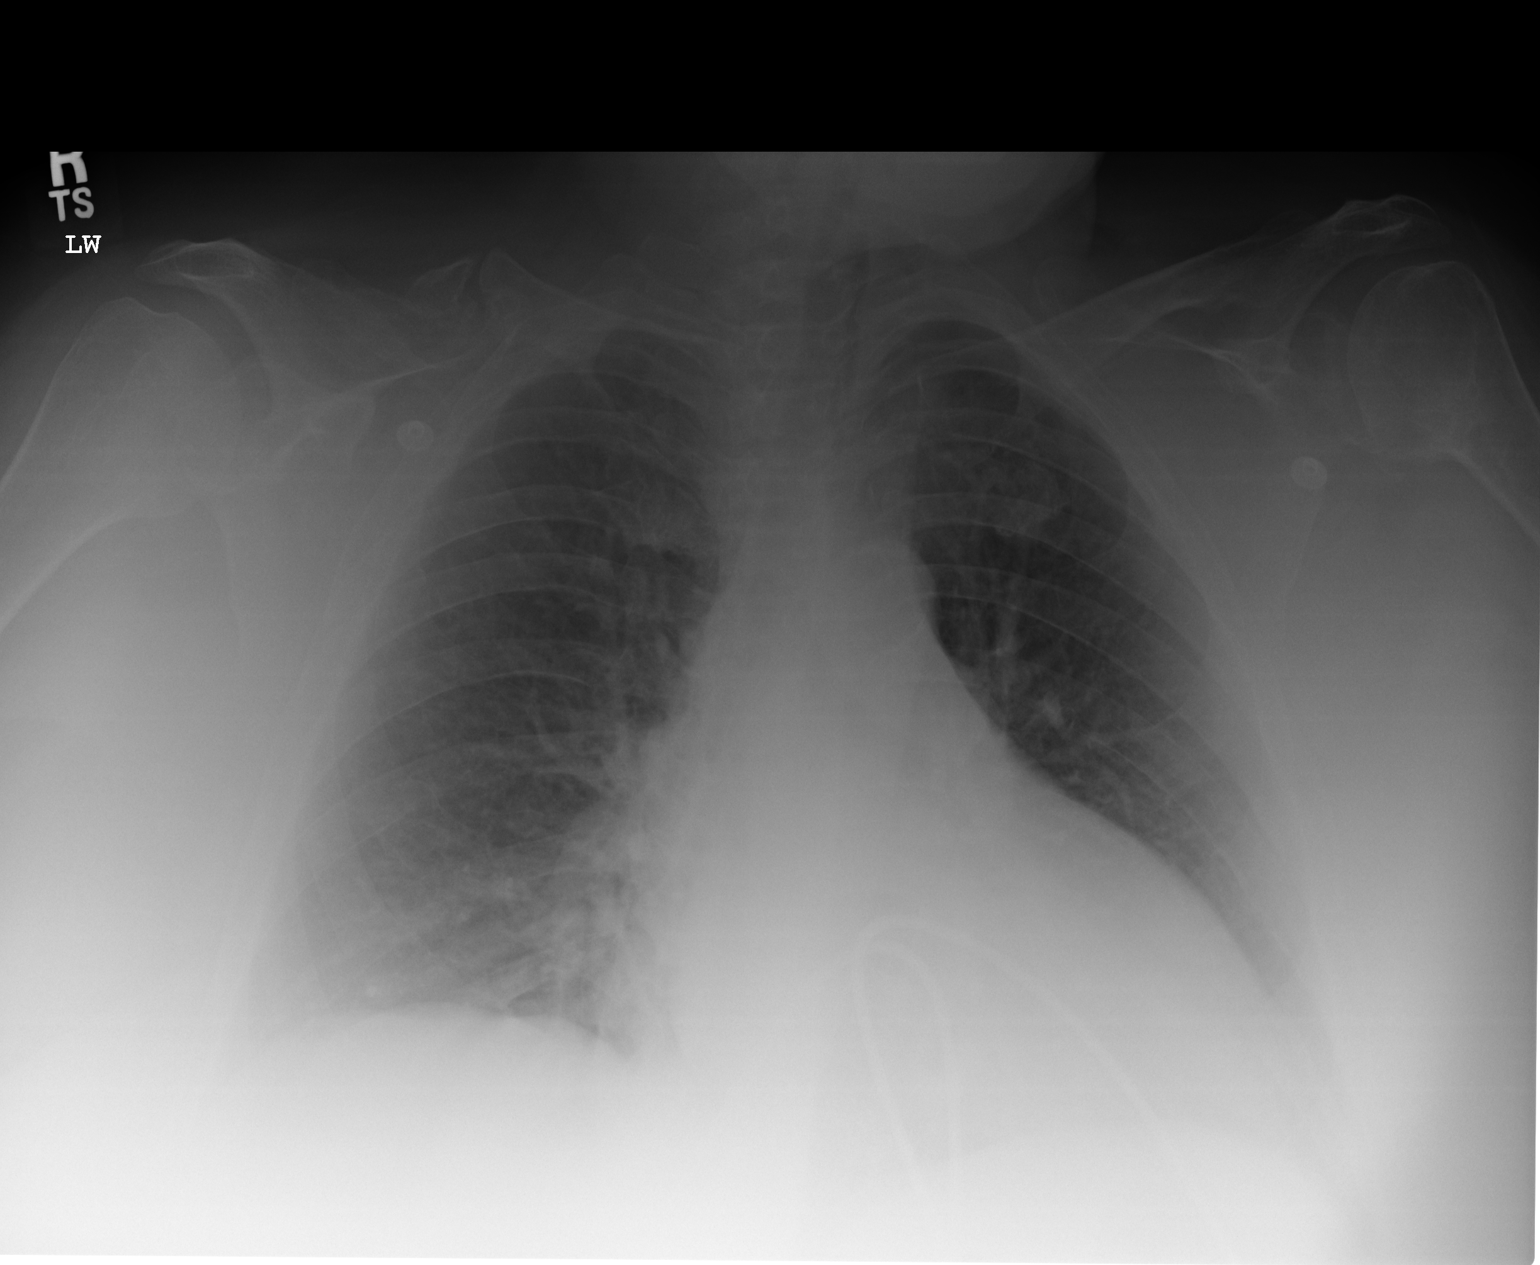

[1 of 1 positions shown; findings below may reference images not displayed]

PROCEDURE:     DXR - DXR PORTABLE CHEST SINGLE VIEW  - January 18, 2010  [DATE]

RESULT:     Comparison is made to a prior study dated 06/18/2008.

The patient has taken a hallow inspiration. With technique taken into
consideration, there is no evidence of focal infiltrates, effusions or
edema. The cardiac silhouette is enlarged. The visualized bony skeleton
demonstrates no gross abnormalities.
IMPRESSION: Shallow inspiration without evidence of acute cardiopulmonary disease.

## 2011-12-09 ENCOUNTER — Other Ambulatory Visit: Payer: Self-pay | Admitting: Family Medicine

## 2011-12-09 LAB — URINALYSIS, COMPLETE
Glucose,UR: NEGATIVE mg/dL (ref 0–75)
Leukocyte Esterase: NEGATIVE
Nitrite: NEGATIVE
Ph: 5 (ref 4.5–8.0)
Protein: 100
RBC,UR: 349 /HPF (ref 0–5)
Specific Gravity: 1.021 (ref 1.003–1.030)

## 2011-12-12 LAB — URINE CULTURE

## 2011-12-15 ENCOUNTER — Ambulatory Visit: Payer: Self-pay | Admitting: Family Medicine

## 2012-01-21 IMAGING — CR DG FOOT COMPLETE 3+V*L*
1 series · 3 of 3 positions shown · non-contrast
Comparison: none

REASON FOR EXAM: left foot pain
COMMENTS:

[Series 2: view not recorded · 0.17mm/px · 3 of 3 slices shown]
[im 1/3]
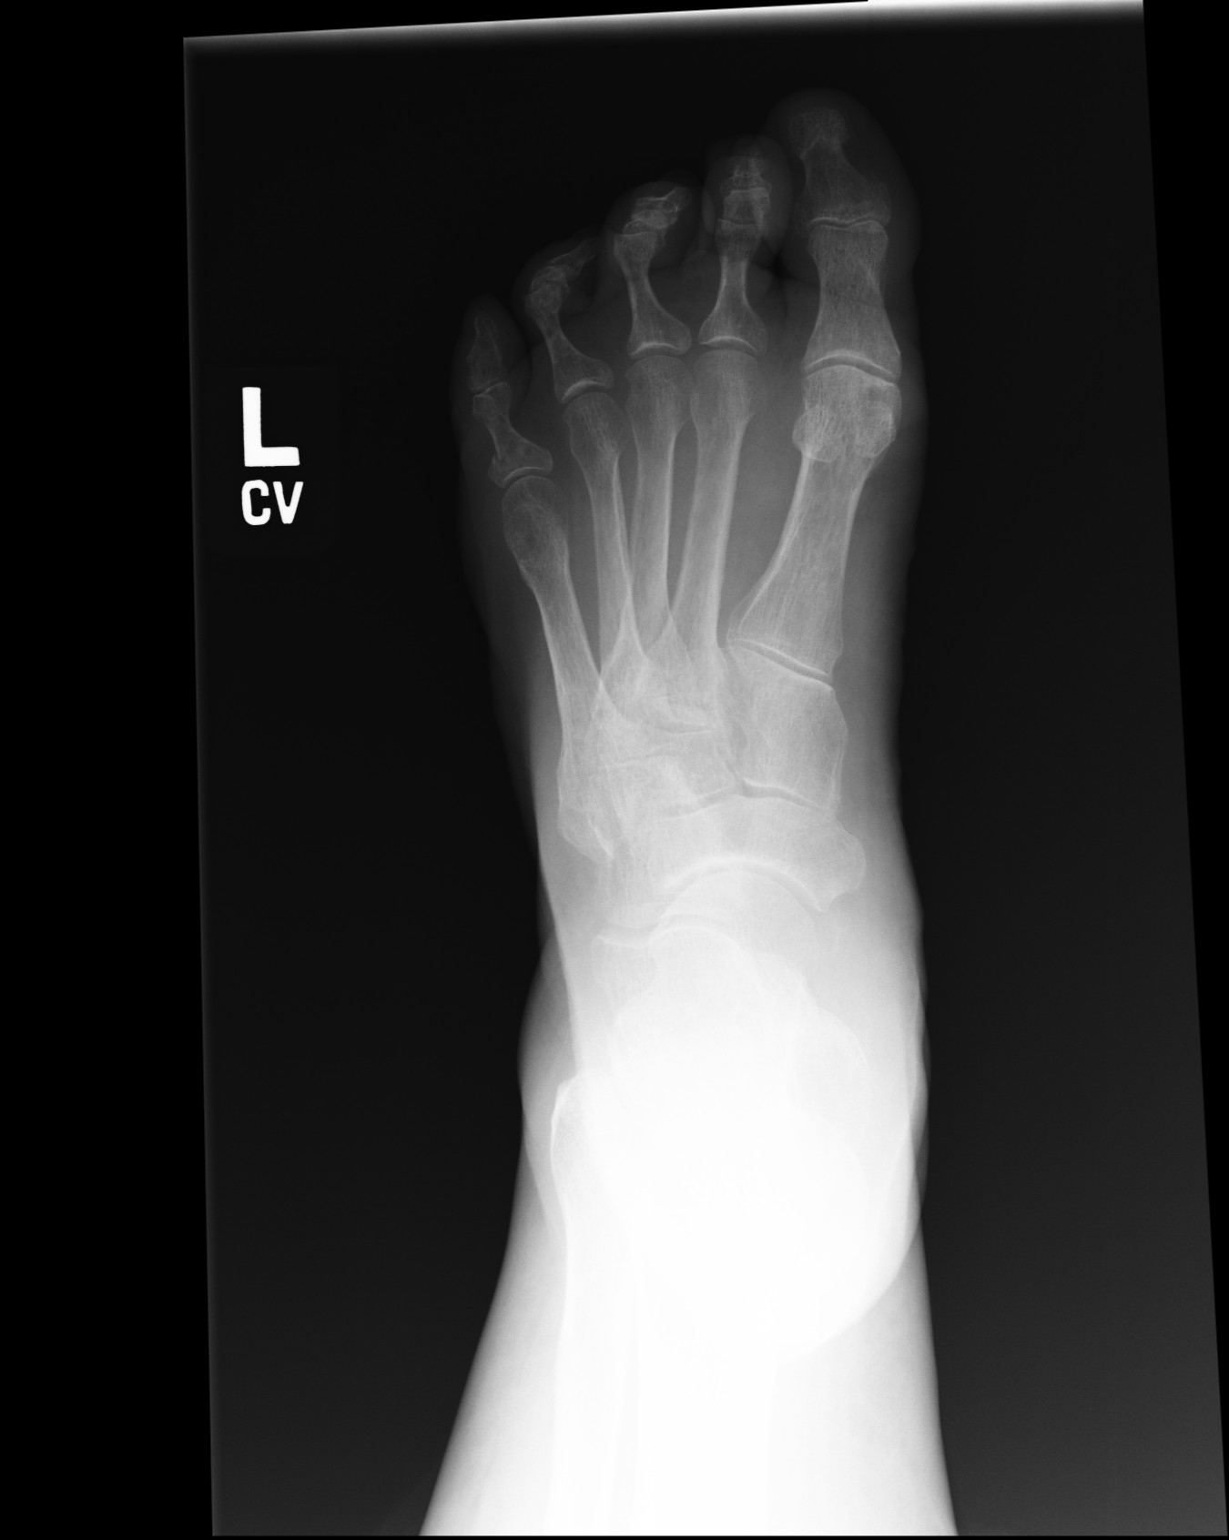
[im 2/3]
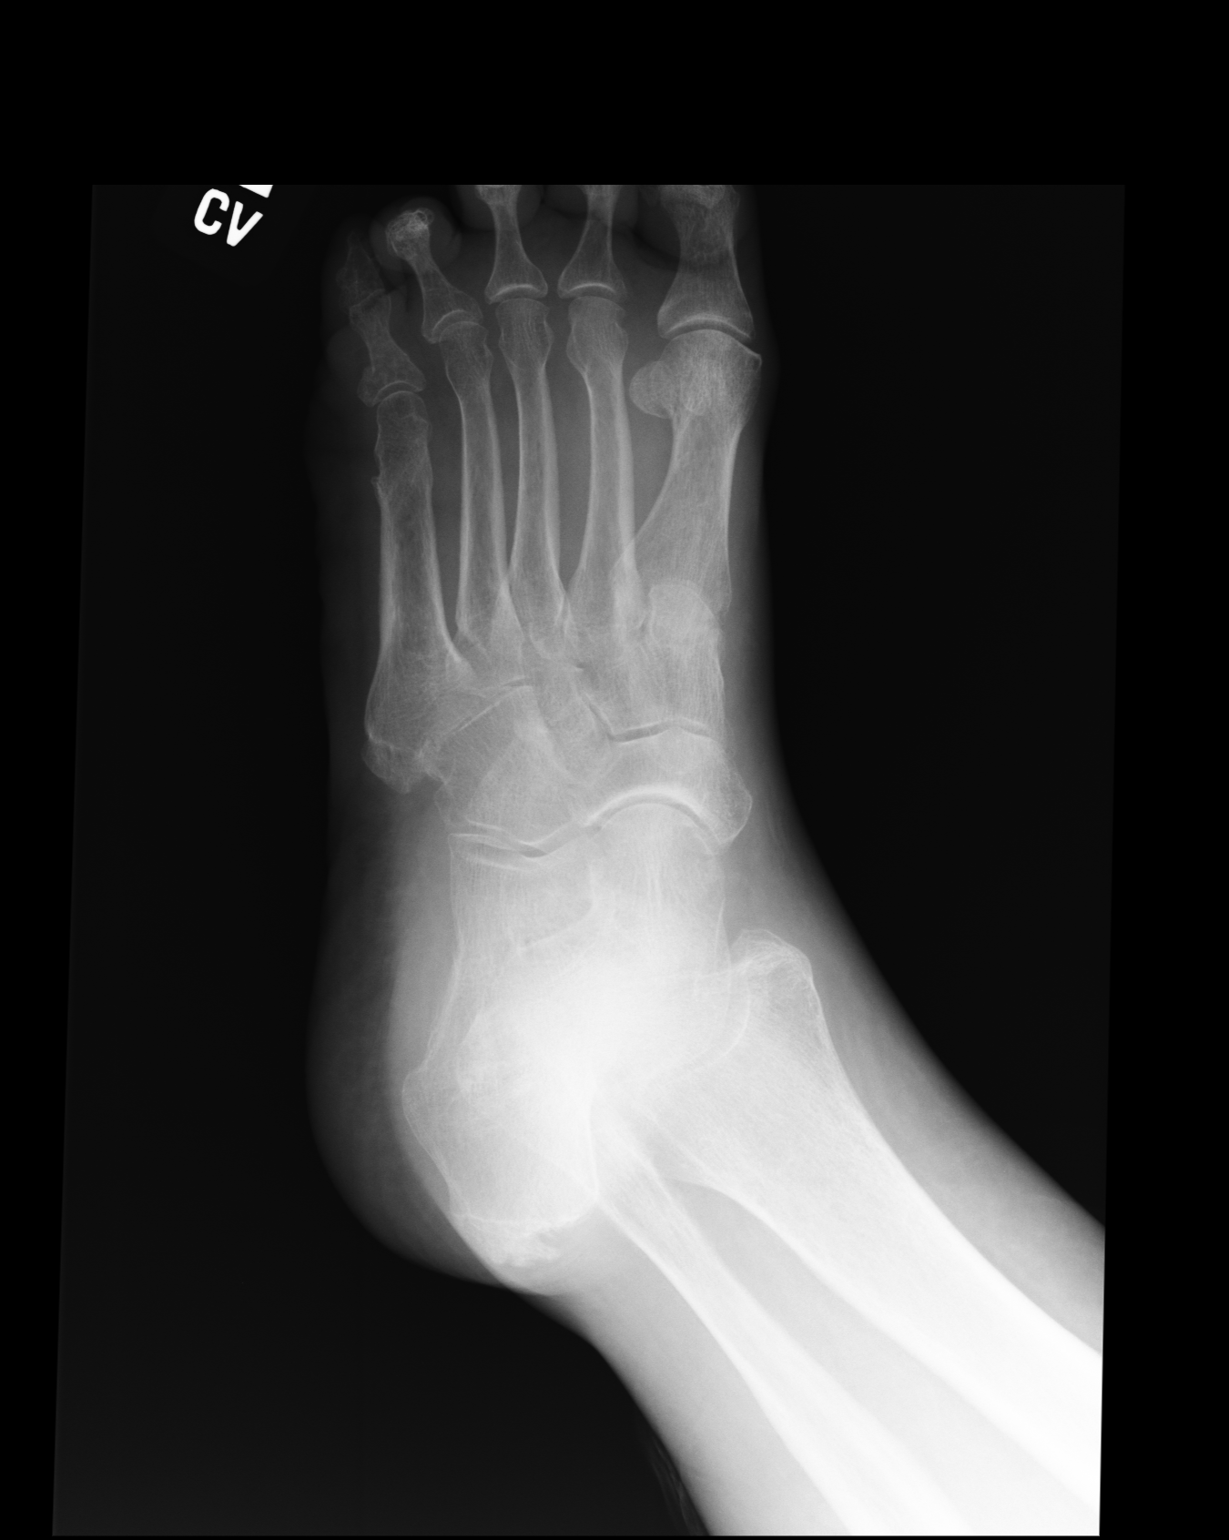
[im 3/3]
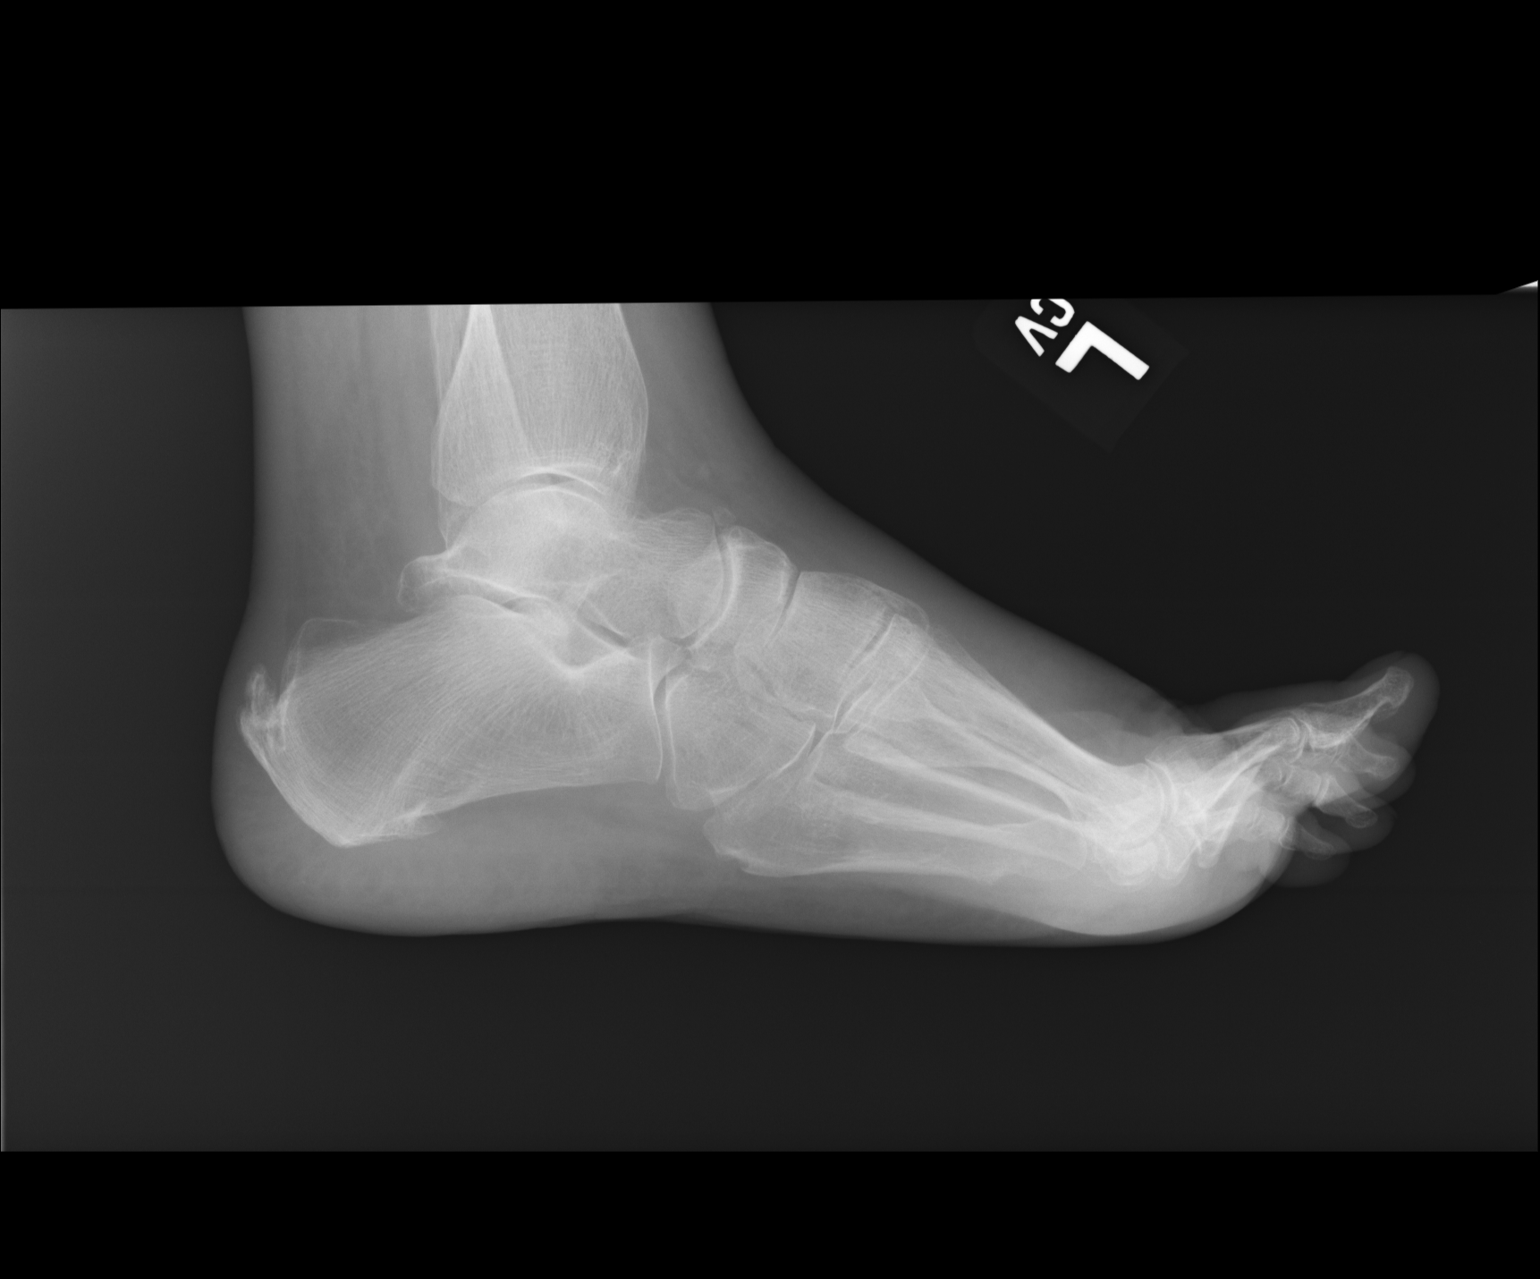

[3 of 3 positions shown; findings below may reference images not displayed]

PROCEDURE:     KDR - KDXR FOOT LT COMP W/OBLIQUES  - March 24, 2010  [DATE]

RESULT:     There is deformity of the proximal portion of the proximal
phalanx of the fifth toe compatible with a minimally displaced impaction
type fracture. Also noted is deformity of the distal shaft of the fifth
metatarsal compatible with residual change from prior injury that has now
healed. No additional fractures are seen. No radiodense soft tissue foreign
body is observed. No periosteal reactive changes are seen.
IMPRESSION: 1.     Fracture of the proximal portion of the proximal phalanx of the fifth
toe, minimally displaced.

## 2012-03-19 IMAGING — CR DG SHOULDER 3+V*R*
1 series · 2 of 2 positions shown · non-contrast
Comparison: none

REASON FOR EXAM: fall, pain
COMMENTS:

[Series 1: view not recorded · 0.17mm/px · 2 of 2 slices shown]
[im 1/2]
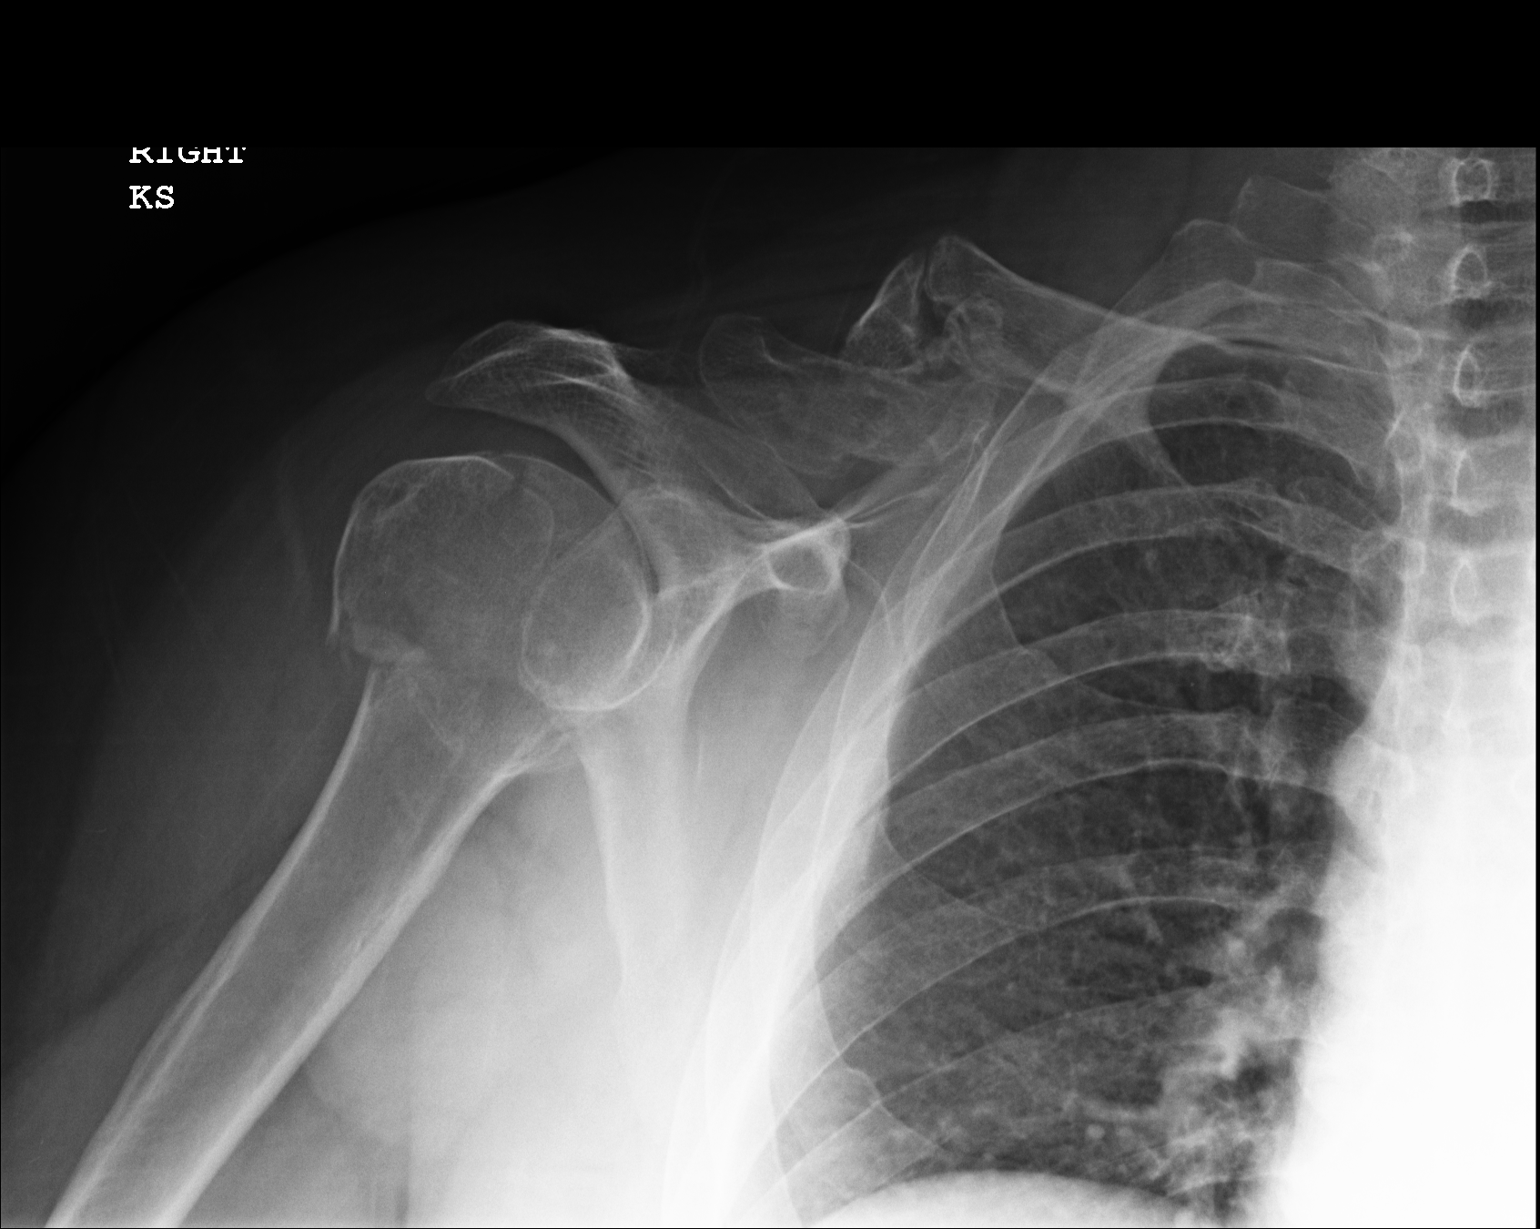
[im 2/2]
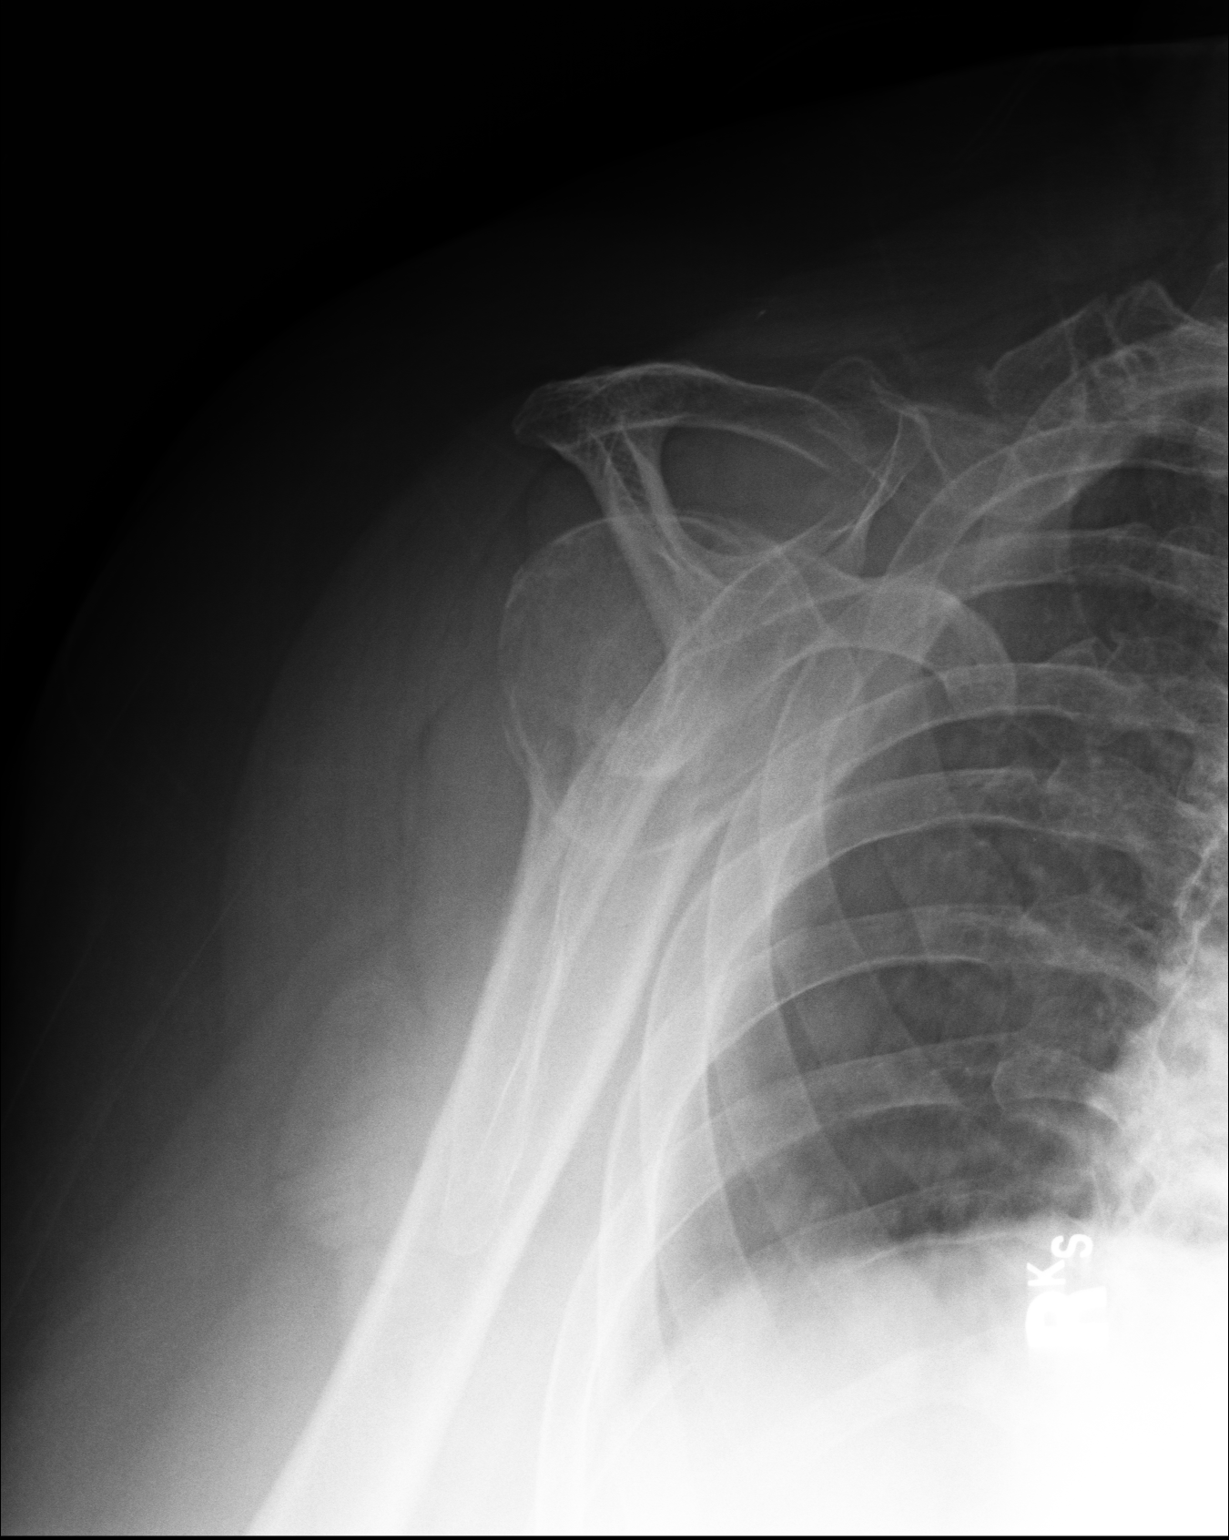

[2 of 2 positions shown; findings below may reference images not displayed]

PROCEDURE:     DXR - DXR SHOULDER RIGHT COMPLETE  - May 21, 2010  [DATE]

RESULT:     The patient has sustained an acute comminuted fracture of the
humeral head. The bony glenoid is grossly intact. There is chronic deformity
of the distal one half of the right clavicle with wide disruption of the AC
joint and overriding of and displacement of the old fracture fragments of
the clavicle.
IMPRESSION: 1. The patient has sustained an acute fracture of the humeral head. It is
comminuted and mildly displaced but the humeral head does not appear
dislocated from the glenohumeral joint.
2. There is chronic deformity of the distal one half of the right clavicle.

## 2012-10-12 ENCOUNTER — Telehealth: Payer: Self-pay | Admitting: *Deleted

## 2012-10-12 NOTE — Telephone Encounter (Signed)
Called patient to cancel appt on 4-9. Will call back to r/s her next week.

## 2012-10-15 ENCOUNTER — Ambulatory Visit: Payer: Self-pay | Admitting: Family Medicine

## 2012-10-19 ENCOUNTER — Emergency Department: Payer: Self-pay | Admitting: Unknown Physician Specialty

## 2012-10-19 LAB — COMPREHENSIVE METABOLIC PANEL
Albumin: 3.7 g/dL (ref 3.4–5.0)
Alkaline Phosphatase: 79 U/L (ref 50–136)
BUN: 56 mg/dL — ABNORMAL HIGH (ref 7–18)
Bilirubin,Total: 0.2 mg/dL (ref 0.2–1.0)
Co2: 26 mmol/L (ref 21–32)
Creatinine: 1.97 mg/dL — ABNORMAL HIGH (ref 0.60–1.30)
EGFR (African American): 32 — ABNORMAL LOW
Glucose: 109 mg/dL — ABNORMAL HIGH (ref 65–99)
Osmolality: 297 (ref 275–301)
Potassium: 4.4 mmol/L (ref 3.5–5.1)
SGOT(AST): 29 U/L (ref 15–37)
Sodium: 141 mmol/L (ref 136–145)
Total Protein: 7.2 g/dL (ref 6.4–8.2)

## 2012-10-19 LAB — URINALYSIS, COMPLETE
Bacteria: NONE SEEN
Bilirubin,UR: NEGATIVE
Specific Gravity: 1.016 (ref 1.003–1.030)
Squamous Epithelial: 3

## 2012-10-19 LAB — CBC
MCHC: 32.3 g/dL (ref 32.0–36.0)
RDW: 16.8 % — ABNORMAL HIGH (ref 11.5–14.5)
WBC: 6.8 10*3/uL (ref 3.6–11.0)

## 2012-10-19 LAB — CK TOTAL AND CKMB (NOT AT ARMC): CK, Total: 110 U/L (ref 21–215)

## 2012-10-23 ENCOUNTER — Ambulatory Visit: Payer: Self-pay | Admitting: Nurse Practitioner

## 2012-11-27 ENCOUNTER — Encounter: Payer: Self-pay | Admitting: General Surgery

## 2012-11-27 ENCOUNTER — Ambulatory Visit (INDEPENDENT_AMBULATORY_CARE_PROVIDER_SITE_OTHER): Payer: Medicare Other | Admitting: General Surgery

## 2012-11-27 VITALS — BP 154/70 | HR 92 | Resp 16 | Ht 62.0 in | Wt 290.0 lb

## 2012-11-27 DIAGNOSIS — I872 Venous insufficiency (chronic) (peripheral): Secondary | ICD-10-CM | POA: Insufficient documentation

## 2012-11-27 NOTE — Patient Instructions (Addendum)
The patient is aware to call back for any questions or concerns.  Start with unna boots weekly then convert to Wearing compression hose daily

## 2012-11-27 NOTE — Progress Notes (Signed)
Patient ID: Susan House, female   DOB: Jan 14, 1956, 57 y.o.   MRN: [id number]  Chief Complaint  Patient presents with  . Other    left leg swelling/ same leg she had stroke in     HPI ADREAM House is Susan 57 y.o. female who presents for left leg swelling. Patient has had previous stroke in this same leg. The patient states she has always had swelling in the left leg since her stroke in 2000. Her most recent stroke was approximately 2 years ago. She states she has had approximately 4 strokes.The past two months the swelling has gotten worse in the left leg but has gotten significantly worse in the past week. She Susan fall 2 weeks ago while trying to get off of the couch. She states her left leg was asleep at the time and didn't realize it.  HPI  Past Medical History  Diagnosis Date  . Cellulitis and abscess of face   . Allergy   . Depression   . Hypertension   . Edema   . Arthritis   . Migraine   . GERD (gastroesophageal reflux disease)   . Stroke   . Diabetes mellitus without complication   . Hyperlipidemia   . Obesity   . IBS (irritable bowel syndrome)   . Urinary incontinence     Past Surgical History  Procedure Laterality Date  . Colonoscopy  2010  . Appendectomy    . Cesarean section    . Hernia repair    . Cholecystectomy    . Eye surgery    . Abdominal hysterectomy    . Upper gi endoscopy    . Carpel tunnel surgery      History reviewed. No pertinent family history.  Social History History  Substance Use Topics  . Smoking status: Never Smoker   . Smokeless tobacco: Not on file  . Alcohol Use: No    Allergies  Allergen Reactions  . Morphine And Related Anaphylaxis  . Betadine (Povidone Iodine) Rash  . Tetracyclines & Related Rash    Current Outpatient Prescriptions  Medication Sig Dispense Refill  . amLODipine (NORVASC) 5 MG tablet Take 5 mg by mouth daily.      Marland Kitchen aspirin 325 MG EC tablet Take 325 mg by mouth daily.      Marland Kitchen atorvastatin  (LIPITOR) 20 MG tablet Take 20 mg by mouth at bedtime.      . baclofen (LIORESAL) 20 MG tablet Take 20 mg by mouth 2 (two) times daily.      . carvedilol (COREG) 6.25 MG tablet 1/2 tab by mouth twice daily.      . citalopram (CELEXA) 40 MG tablet Take 40 mg by mouth daily.      . clopidogrel (PLAVIX) 75 MG tablet Take 75 mg by mouth daily.      . enalapril (VASOTEC) 20 MG tablet Take 20 mg by mouth 2 (two) times daily.      . hydrochlorothiazide (HYDRODIURIL) 25 MG tablet Take 25 mg by mouth daily.      . insulin NPH-regular (NOVOLIN 70/30) (70-30) 100 UNIT/ML injection Inject 15 Units into the skin 2 (two) times daily.      . metFORMIN (GLUCOPHAGE) 1000 MG tablet Take 1,000 mg by mouth 2 (two) times daily.      Marland Kitchen oxybutynin (DITROPAN-XL) 5 MG 24 hr tablet Take 5 mg by mouth 4 (four) times daily.      . pantoprazole (PROTONIX) 40 MG tablet Take 40 mg  by mouth daily.      . pramipexole (MIRAPEX) 0.75 MG tablet Take 0.75 mg by mouth 2 (two) times daily.       No current facility-administered medications for this visit.    Review of Systems Review of Systems  Constitutional: Negative.   Respiratory: Negative.   Cardiovascular: Negative.     Blood pressure 154/70, pulse 92, resp. rate 16, height 5\' 2"  (1.575 m), weight 290 lb (131.543 kg).  Physical Exam Physical Exam  Constitutional: She is oriented to person, place, and time. She appears well-developed.  Cardiovascular: Normal rate and regular rhythm.   Pulmonary/Chest: Effort normal and breath sounds normal.  Neurological: She is alert and oriented to person, place, and time.  Skin: Skin is warm and dry.  1+ edema on right, 3+on left with some stasis discoloration. Left leg is tender to palpation, but no redness noted. Feet are warm capillary refill is normal. Pulses were [place]  Data Reviewed Venous us-no clots seen.   Assessment    Venous insufficiency Plan    Una boot with compression followed by use of compression  hose. Discussed fully with pt.     Duplex venous study of left lower limb was performed. No clots seen in CFV,Fv or PV. Doppler flow is phasic, and varies with deep breathing and  Compression. With the patient lying supine in Susan reverse Trendelenburg position there is mild reflux of  0.53 seconds noted in the common femoral vein. Left GSV is normal  SANKAR,SEEPLAPUTHUR G 11/27/2012, 12:56 PM

## 2012-12-04 ENCOUNTER — Ambulatory Visit (INDEPENDENT_AMBULATORY_CARE_PROVIDER_SITE_OTHER): Payer: Medicare Other | Admitting: *Deleted

## 2012-12-04 DIAGNOSIS — I872 Venous insufficiency (chronic) (peripheral): Secondary | ICD-10-CM

## 2012-12-04 NOTE — Progress Notes (Signed)
Unna boot changed to left leg. Swelling much improved. States it feels better.

## 2012-12-04 NOTE — Patient Instructions (Signed)
The patient is aware to call back for any questions or concerns.  

## 2012-12-11 ENCOUNTER — Encounter: Payer: Self-pay | Admitting: General Surgery

## 2012-12-11 ENCOUNTER — Ambulatory Visit (INDEPENDENT_AMBULATORY_CARE_PROVIDER_SITE_OTHER): Payer: Medicare Other | Admitting: General Surgery

## 2012-12-11 VITALS — BP 140/68 | HR 64 | Resp 16 | Ht 61.0 in | Wt 295.0 lb

## 2012-12-11 DIAGNOSIS — I872 Venous insufficiency (chronic) (peripheral): Secondary | ICD-10-CM

## 2012-12-11 NOTE — Progress Notes (Signed)
Patient ID: Susan House, female   DOB: 02/24/1956, 57 y.o.   MRN: [id number]  Chief Complaint  Patient presents with  . Follow-up    HPI Susan House is a 57 y.o. female who presents for follow up left leg swelling. Patient has had previous stroke in this same leg. The patient states she has always had swelling in the left leg since her stroke in 2000. Her most recent stroke was approximately 2 years ago. She states she has had approximately 4 strokes. Removed the unna boot last night.   HPI  Past Medical History  Diagnosis Date  . Cellulitis and abscess of face   . Allergy   . Depression   . Hypertension   . Edema   . Arthritis   . Migraine   . GERD (gastroesophageal reflux disease)   . Stroke   . Diabetes mellitus without complication   . Hyperlipidemia   . Obesity   . IBS (irritable bowel syndrome)   . Urinary incontinence     Past Surgical History  Procedure Laterality Date  . Colonoscopy  2010  . Appendectomy    . Cesarean section    . Hernia repair    . Cholecystectomy    . Eye surgery    . Abdominal hysterectomy    . Upper gi endoscopy    . Carpel tunnel surgery      History reviewed. No pertinent family history.  Social History History  Substance Use Topics  . Smoking status: Never Smoker   . Smokeless tobacco: Not on file  . Alcohol Use: No    Allergies  Allergen Reactions  . Morphine And Related Anaphylaxis  . Betadine (Povidone Iodine) Rash  . Tetracyclines & Related Rash    Current Outpatient Prescriptions  Medication Sig Dispense Refill  . amLODipine (NORVASC) 5 MG tablet Take 5 mg by mouth daily.      Marland Kitchen aspirin 325 MG EC tablet Take 325 mg by mouth daily.      Marland Kitchen atorvastatin (LIPITOR) 20 MG tablet Take 20 mg by mouth at bedtime.      . baclofen (LIORESAL) 20 MG tablet Take 20 mg by mouth 2 (two) times daily.      . carvedilol (COREG) 6.25 MG tablet 1/2 tab by mouth twice daily.      . citalopram (CELEXA) 40 MG tablet Take 40 mg by  mouth daily.      . clopidogrel (PLAVIX) 75 MG tablet Take 75 mg by mouth daily.      . enalapril (VASOTEC) 20 MG tablet Take 20 mg by mouth 2 (two) times daily.      . hydrochlorothiazide (HYDRODIURIL) 25 MG tablet Take 25 mg by mouth daily.      . insulin NPH-regular (NOVOLIN 70/30) (70-30) 100 UNIT/ML injection Inject 15 Units into the skin 2 (two) times daily.      . metFORMIN (GLUCOPHAGE) 1000 MG tablet Take 1,000 mg by mouth 2 (two) times daily.      Marland Kitchen oxybutynin (DITROPAN-XL) 5 MG 24 hr tablet Take 5 mg by mouth 4 (four) times daily.      . pantoprazole (PROTONIX) 40 MG tablet Take 40 mg by mouth daily.      . pramipexole (MIRAPEX) 0.75 MG tablet Take 0.75 mg by mouth 2 (two) times daily.       No current facility-administered medications for this visit.    Review of Systems Review of Systems  Constitutional: Negative.   Respiratory: Negative.  Cardiovascular: Negative.     Blood pressure 140/68, pulse 64, resp. rate 16, height 5\' 1"  (1.549 m), weight 295 lb (133.811 kg).  Physical Exam Physical Exam  Constitutional: She is oriented to person, place, and time. She appears well-developed and well-nourished.  Neurological: She is alert and oriented to person, place, and time.  Skin: Skin is warm and dry.   Left leg shows less edema and marked reduction in skin discoloration Data Reviewed none  Assessment    Venous insufficiency    Plan    Compression hose RX given 30-40 mmHg       Susan House G 12/12/2012, 9:36 AM

## 2012-12-11 NOTE — Patient Instructions (Addendum)
Wear Compression hose daily The patient is aware to call back for any questions or concerns.

## 2012-12-12 ENCOUNTER — Encounter: Payer: Self-pay | Admitting: General Surgery

## 2012-12-18 ENCOUNTER — Encounter: Payer: Self-pay | Admitting: General Surgery

## 2012-12-22 ENCOUNTER — Other Ambulatory Visit: Payer: Self-pay

## 2012-12-22 MED ORDER — PRAMIPEXOLE DIHYDROCHLORIDE 0.75 MG PO TABS: 0.7500 mg | ORAL_TABLET | Freq: Two times a day (BID) | ORAL | Status: DC

## 2013-01-02 ENCOUNTER — Ambulatory Visit (INDEPENDENT_AMBULATORY_CARE_PROVIDER_SITE_OTHER): Payer: Medicare Other | Admitting: *Deleted

## 2013-01-02 DIAGNOSIS — I872 Venous insufficiency (chronic) (peripheral): Secondary | ICD-10-CM

## 2013-01-02 NOTE — Patient Instructions (Signed)
1 week unna boot

## 2013-01-02 NOTE — Progress Notes (Signed)
The patient came in today for unna boot.  Unna boot to left leg, kerlix and coban applied.  Left posterior calf with redness, tender and warm to touch. Follow up as scheduled.

## 2013-01-03 ENCOUNTER — Encounter: Payer: Self-pay | Admitting: Nurse Practitioner

## 2013-01-03 ENCOUNTER — Ambulatory Visit (INDEPENDENT_AMBULATORY_CARE_PROVIDER_SITE_OTHER): Payer: Medicare Other | Admitting: Nurse Practitioner

## 2013-01-03 VITALS — BP 128/67 | HR 74 | Ht 61.5 in | Wt 293.0 lb

## 2013-01-03 DIAGNOSIS — I69959 Hemiplegia and hemiparesis following unspecified cerebrovascular disease affecting unspecified side: Secondary | ICD-10-CM

## 2013-01-03 DIAGNOSIS — R42 Dizziness and giddiness: Secondary | ICD-10-CM | POA: Insufficient documentation

## 2013-01-03 DIAGNOSIS — I69993 Ataxia following unspecified cerebrovascular disease: Secondary | ICD-10-CM

## 2013-01-03 DIAGNOSIS — G2581 Restless legs syndrome: Secondary | ICD-10-CM

## 2013-01-03 DIAGNOSIS — I69998 Other sequelae following unspecified cerebrovascular disease: Secondary | ICD-10-CM | POA: Insufficient documentation

## 2013-01-03 NOTE — Patient Instructions (Addendum)
Continue Plavix and aspirin for secondary stroke prevention Continue Mirapex for restless legs Follow up in 6-8 months

## 2013-01-03 NOTE — Progress Notes (Signed)
HPI:  Returns for followup after her last visit with Dr. Brett Fairy 04/17/2012. She has history of  stroke in July, 2000, with residual left hemiparesis and left homonymous hemaniopsia. She experienced a posterior circulation TIA in June, 2010.   In October, 2010, she developed horizontal diplopia and was seen at [facility] ER. An MRI was performed that was unchanged from prior studies. 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  The diplopia resolved after a few months and she has had no further problems with her vision. She does report some intermittent dizziness with upgaze.  She complains of tinnitus and wonders if it is related to her other medical conditions. She says that it is not present constantly and does not have a pulsatile quality. She finds it annoying but it is not otherwise bothersome to her.  Patient also has a history of restless leg syndrome for which she takes Mirapex. She denies any compulsive behaviors. She claims she had an ER admission in March for dizziness which she was told was due to dehydration. She received IV fluids. She is wearing a Unna boot to the left lower leg, she has vascular insufficiency but claims that she has no skin breakdown. She reports that her CBGs have been within the normal range. She has no new complaints today    ROS:  Allergies, weight gain, swelling and left leg, insomnia  Physical Exam General: well developed, obese female  seated, in no evident distress Head: head normocephalic and atraumatic. Oropharynx benign Neck: supple with no carotid  bruits Cardiovascular: regular rate and rhythm, no murmurs Skin: Unna boot left leg  Neurologic Exam Mental Status: Awake and fully alert. Oriented to place and time. Follows all commands. Speech and language normal.   Cranial Nerves: Fundoscopic exam reveals sharp disc margins. Pupils equal, briskly  reactive to light. Extraocular movements with left homonymous hemianopsia. Visual fields full to confrontation. Hearing intact and symmetric to finger snap.  Face, tongue, palate move normally and symmetrically. Neck flexion and extension normal.  Motor: Normal bulk and tone on right, left exhibit spastic hemiparesis with a left leg having 4/5 strength, left foot drop, and Unna boot in  place. The left upper arm has 4-5 strength of the hand has 2/5 strength she has a clawlike deformity of the left hand and increased tone.  Sensory.: intact to touch , temperature and pinprick and vibratory on the right, diminished to touch and temperature on the left but pinprick and vibratory are normal.  Coordination: Significantly diminished on the left, normal on the right Gait and Station: Wheelchair-bound   Reflexes: 2+ and symmetric on the left, 1+ on the right . Toes downgoing on the right upgoing on the left.     ASSESSMENT: History of stroke in July 2000 with residual left hemiparesis and left homonymous hemianopsia. Posterior circulatory TIA in June 2010. Restless leg syndrome     PLAN: Continue Plavix and aspirin for secondary stroke prevention Continue Mirapex for restless legs Due to her venous insufficiency and unna boot  on the left having much difficulty doing any kind of exercise program at this time.  Follow up in 6-8 months  Dennie Bible, [facility] APRN

## 2013-01-08 ENCOUNTER — Ambulatory Visit (INDEPENDENT_AMBULATORY_CARE_PROVIDER_SITE_OTHER): Payer: Medicare Other | Admitting: *Deleted

## 2013-01-08 DIAGNOSIS — I872 Venous insufficiency (chronic) (peripheral): Secondary | ICD-10-CM

## 2013-01-08 NOTE — Patient Instructions (Signed)
Follow up as needed

## 2013-01-08 NOTE — Progress Notes (Signed)
Patient here today for unna boot change.  The area of concern on the left leg is with no redness, no swelling and no tenderness.  No unna boot per Dr Jamal Collin.  Continue to wear compression hose.

## 2013-01-09 ENCOUNTER — Ambulatory Visit: Payer: Medicare Other

## 2013-01-25 ENCOUNTER — Emergency Department: Payer: Self-pay | Admitting: Emergency Medicine

## 2013-01-26 ENCOUNTER — Emergency Department: Payer: Self-pay | Admitting: Emergency Medicine

## 2013-01-26 LAB — BASIC METABOLIC PANEL
BUN: 26 mg/dL — ABNORMAL HIGH (ref 7–18)
Calcium, Total: 8.6 mg/dL (ref 8.5–10.1)
Chloride: 108 mmol/L — ABNORMAL HIGH (ref 98–107)
Creatinine: 1.26 mg/dL (ref 0.60–1.30)
EGFR (African American): 55 — ABNORMAL LOW
Glucose: 140 mg/dL — ABNORMAL HIGH (ref 65–99)
Potassium: 4.3 mmol/L (ref 3.5–5.1)
Sodium: 141 mmol/L (ref 136–145)

## 2013-02-10 IMAGING — US US EXTREM LOW VENOUS*L*
1 series · 17 of 23 positions shown · non-contrast
Comparison: none

REASON FOR EXAM: CR [id number] lower left calf pain and swelling  Eval DVT
COMMENTS:

[Series 1: us extrem low venous*left* · 17 of 23 slices shown]
[im 1/23]
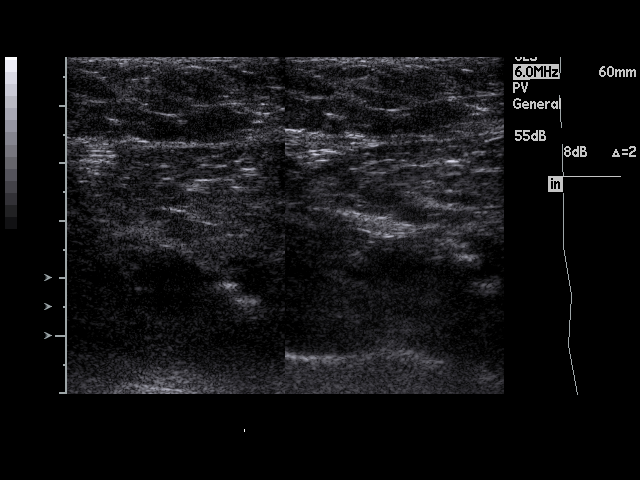
[im 3/23]
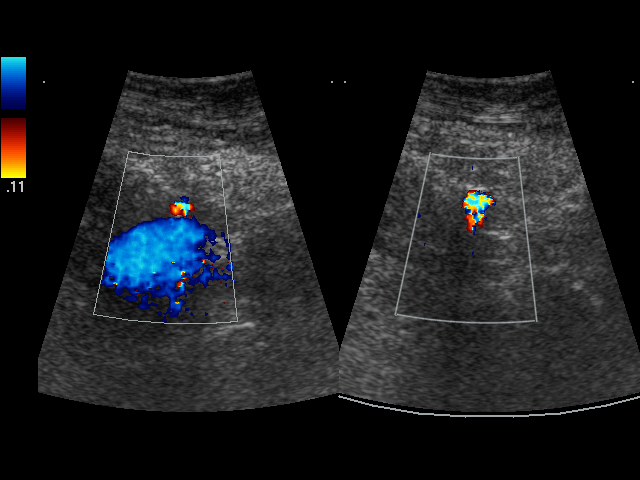
[im 4/23]
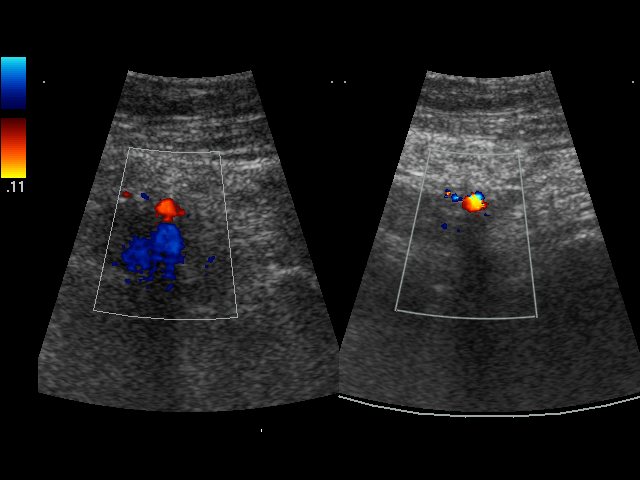
[im 5/23]
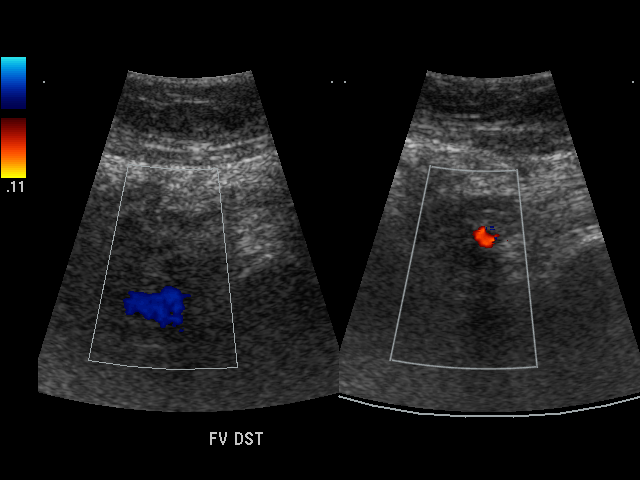
[im 7/23]
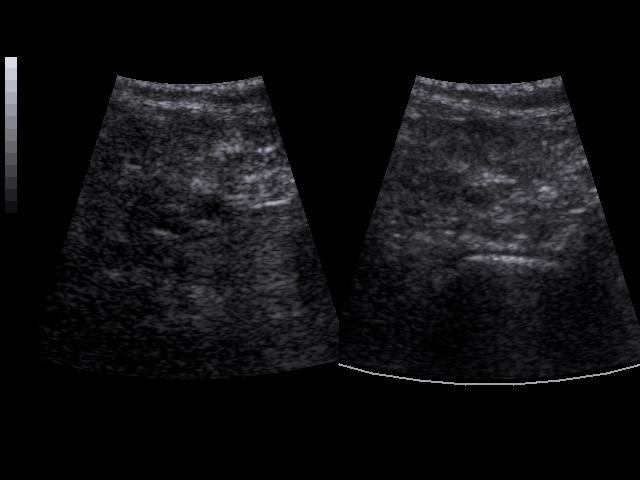
[im 8/23]
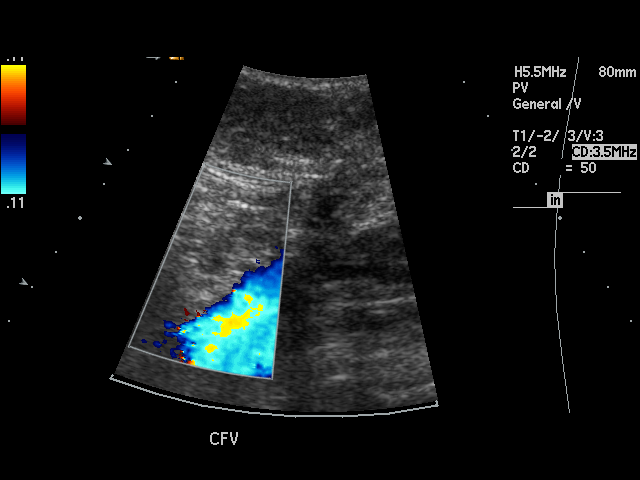
[im 9/23]
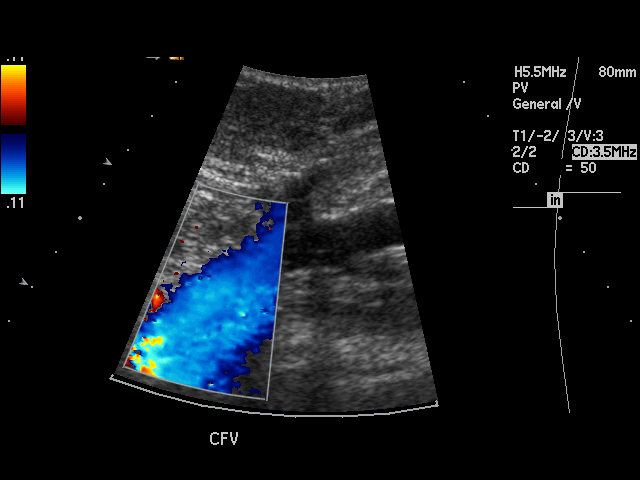
[im 11/23]
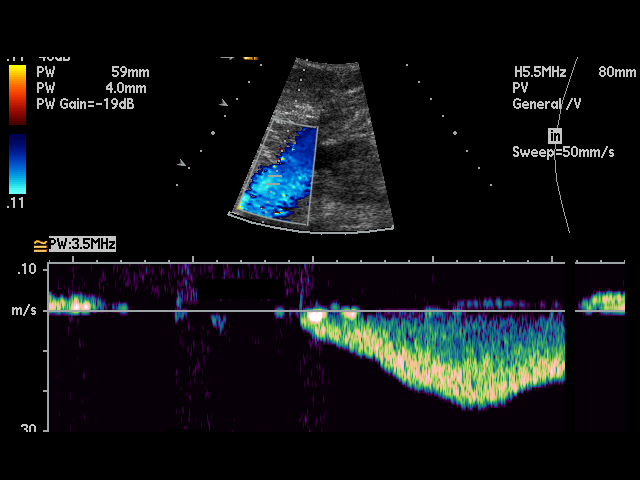
[im 12/23]
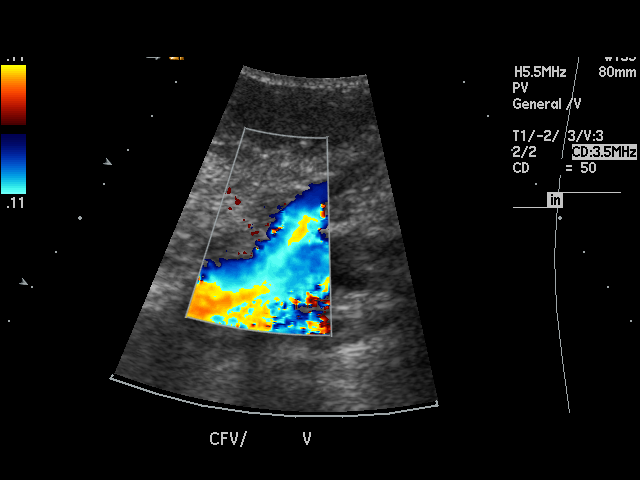
[im 13/23]
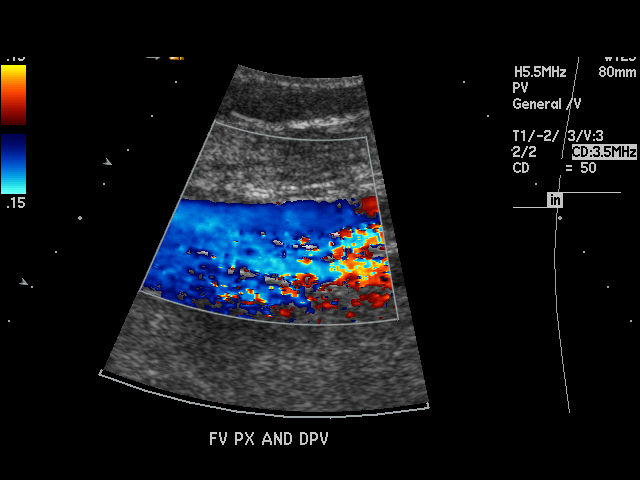
[im 15/23]
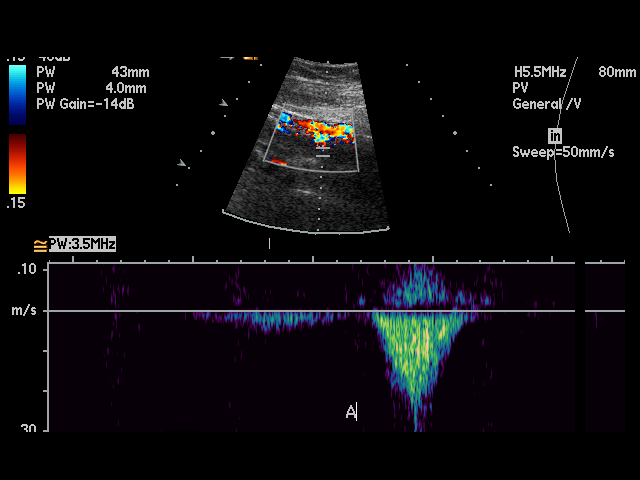
[im 16/23]
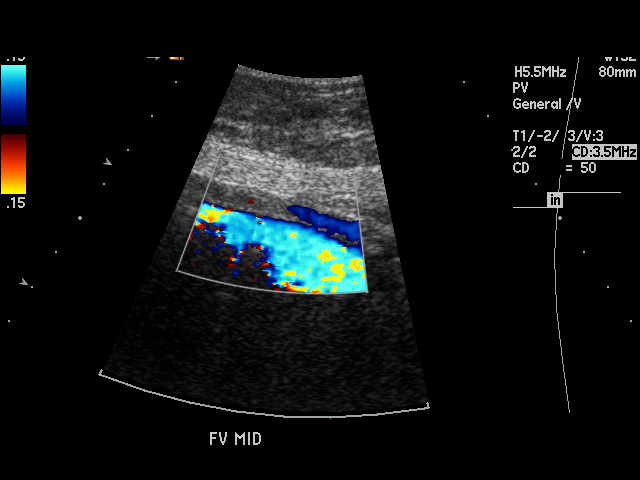
[im 17/23]
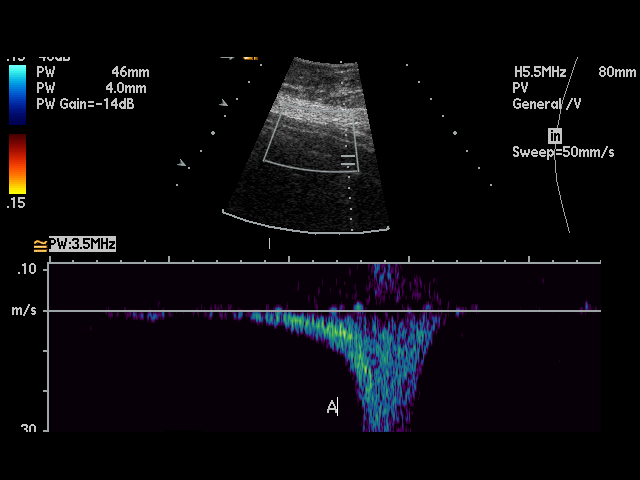
[im 19/23]
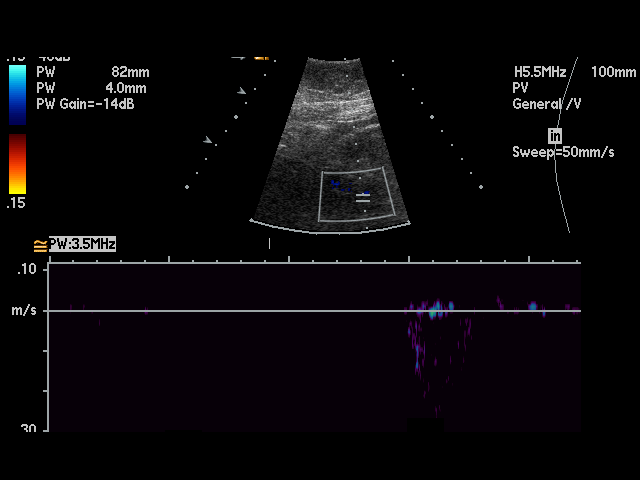
[im 20/23]
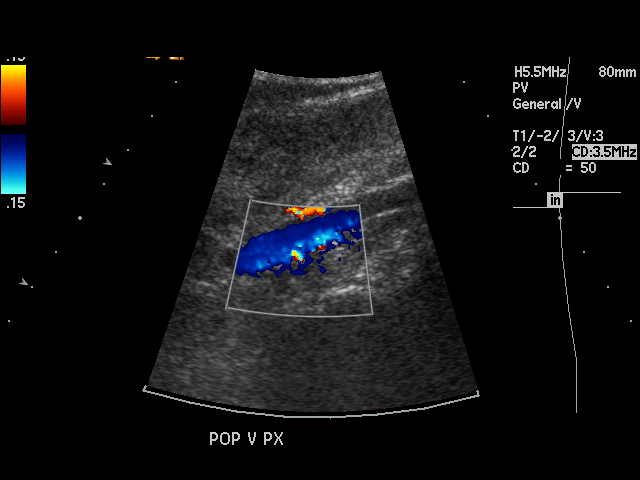
[im 21/23]
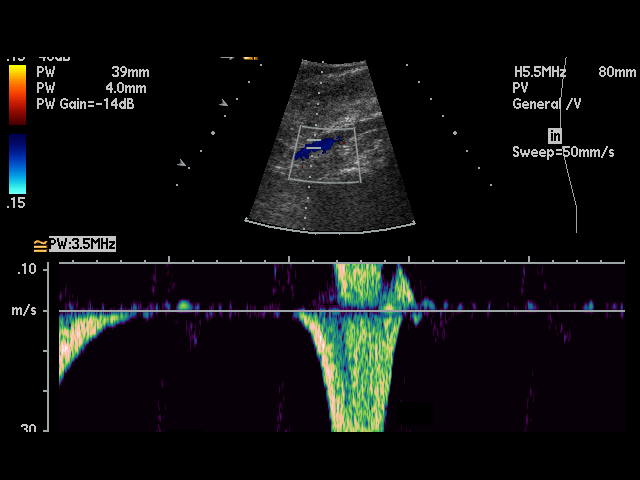
[im 23/23]
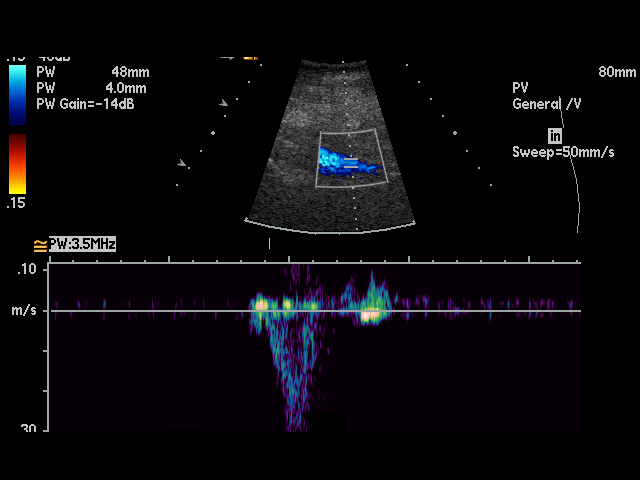

[17 of 23 positions shown; findings below may reference images not displayed]

PROCEDURE:     US  - US DOPPLER LOW EXTR LEFT  - April 14, 2011  [DATE]

RESULT:     This examination is limited due to body habitus. Routine
compression of the venous system cannot be performed in the usual fashion
but was performed utilizing compression with Doppler. No loss of compression
is identified but this method is less sensitive. Routine Doppler examination
shows no occlusion or evidence for deep vein thrombosis.
IMPRESSION: 1. No occlusion or evidence for deep vein thrombosis is identified.
2. Note is made that the examination is limited due to body habitus.

## 2013-03-12 ENCOUNTER — Ambulatory Visit: Payer: Self-pay | Admitting: Family Medicine

## 2013-03-19 ENCOUNTER — Ambulatory Visit: Payer: Self-pay | Admitting: Orthopedic Surgery

## 2013-04-09 ENCOUNTER — Ambulatory Visit: Payer: Self-pay | Admitting: Orthopedic Surgery

## 2013-05-20 ENCOUNTER — Other Ambulatory Visit: Payer: Self-pay | Admitting: Neurology

## 2013-05-20 MED ORDER — PRAMIPEXOLE DIHYDROCHLORIDE 0.75 MG PO TABS: 0.7500 mg | ORAL_TABLET | Freq: Two times a day (BID) | ORAL | Status: DC

## 2013-05-22 ENCOUNTER — Other Ambulatory Visit: Payer: Self-pay

## 2013-07-25 LAB — LIPID PANEL
Cholesterol: 132 mg/dL (ref 0–200)
HDL: 48 mg/dL (ref 35–70)
LDL CALC: 67 mg/dL
LDL/HDL RATIO: 1.4
Triglycerides: 87 mg/dL (ref 40–160)

## 2013-07-28 IMAGING — CR DG CHEST 2V
1 series · 3 of 3 positions shown · non-contrast
Comparison: none

REASON FOR EXAM: shortness of breath
COMMENTS:

[Series 1: pa · 0.17mm/px · 3 of 3 slices shown]
[im 1/3]
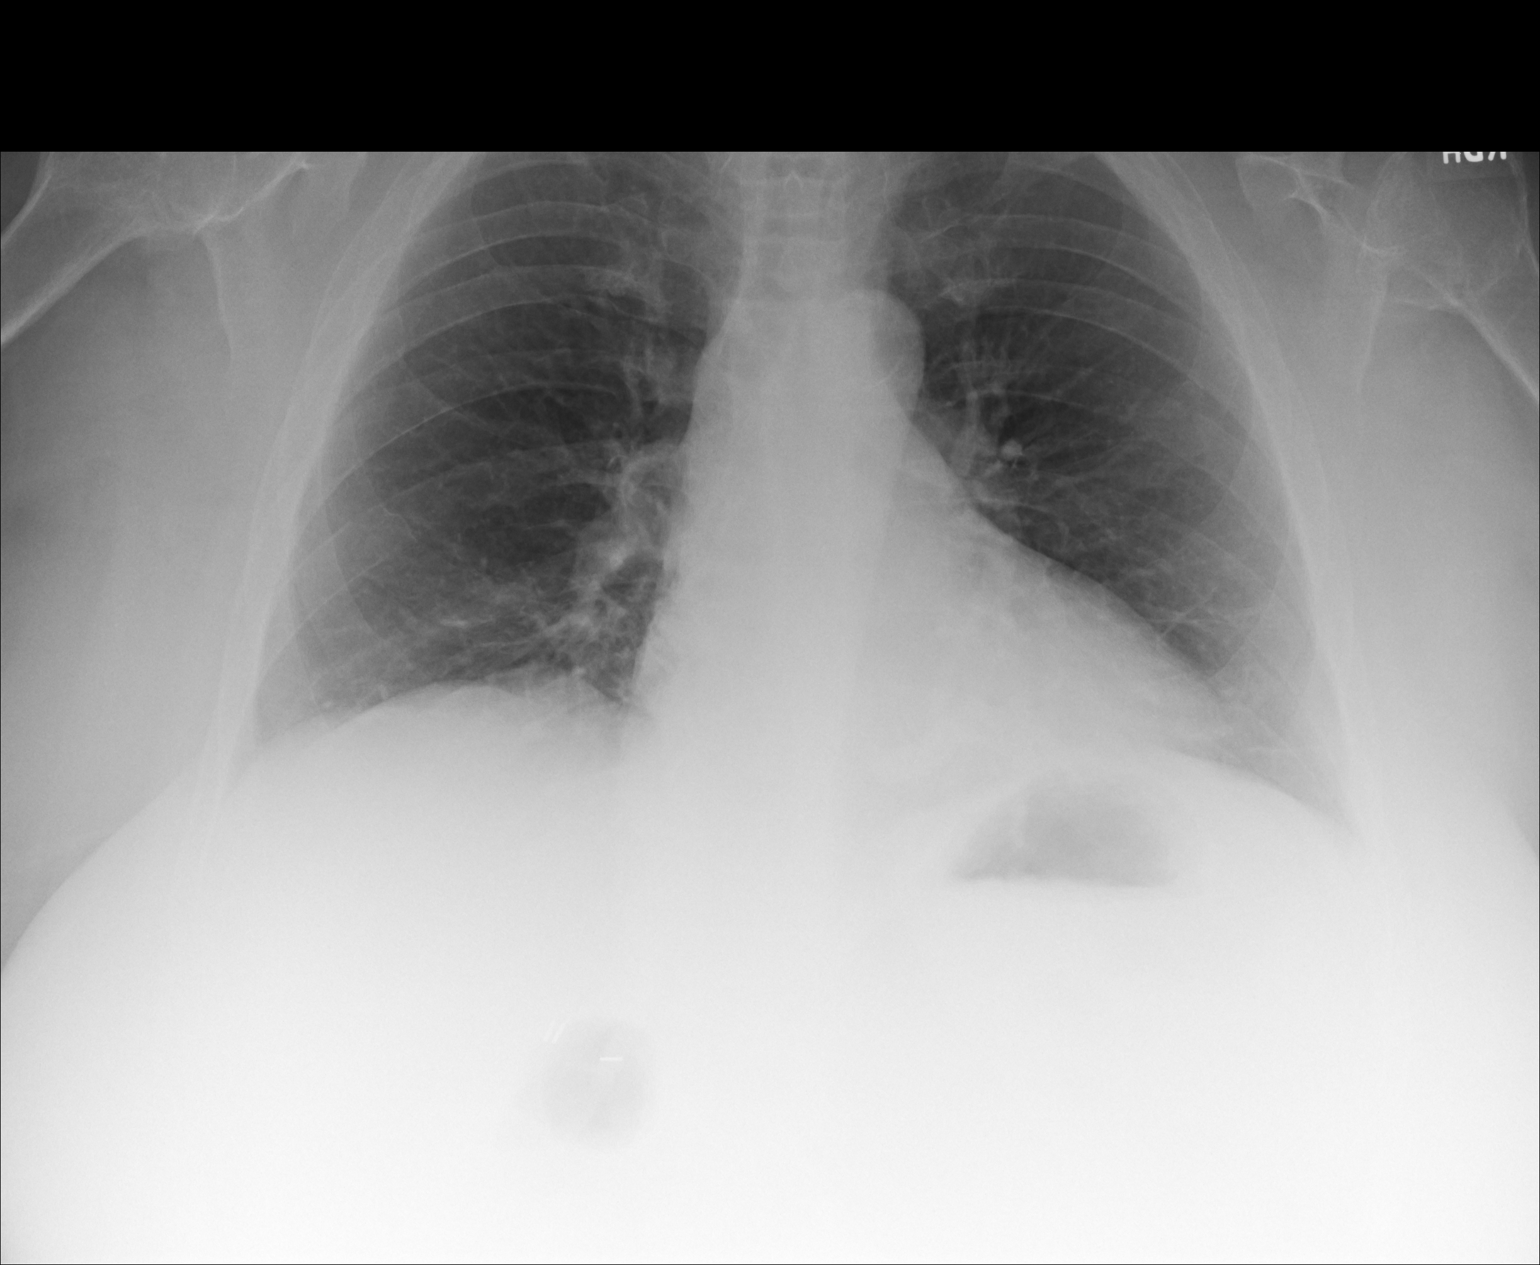
[im 2/3]
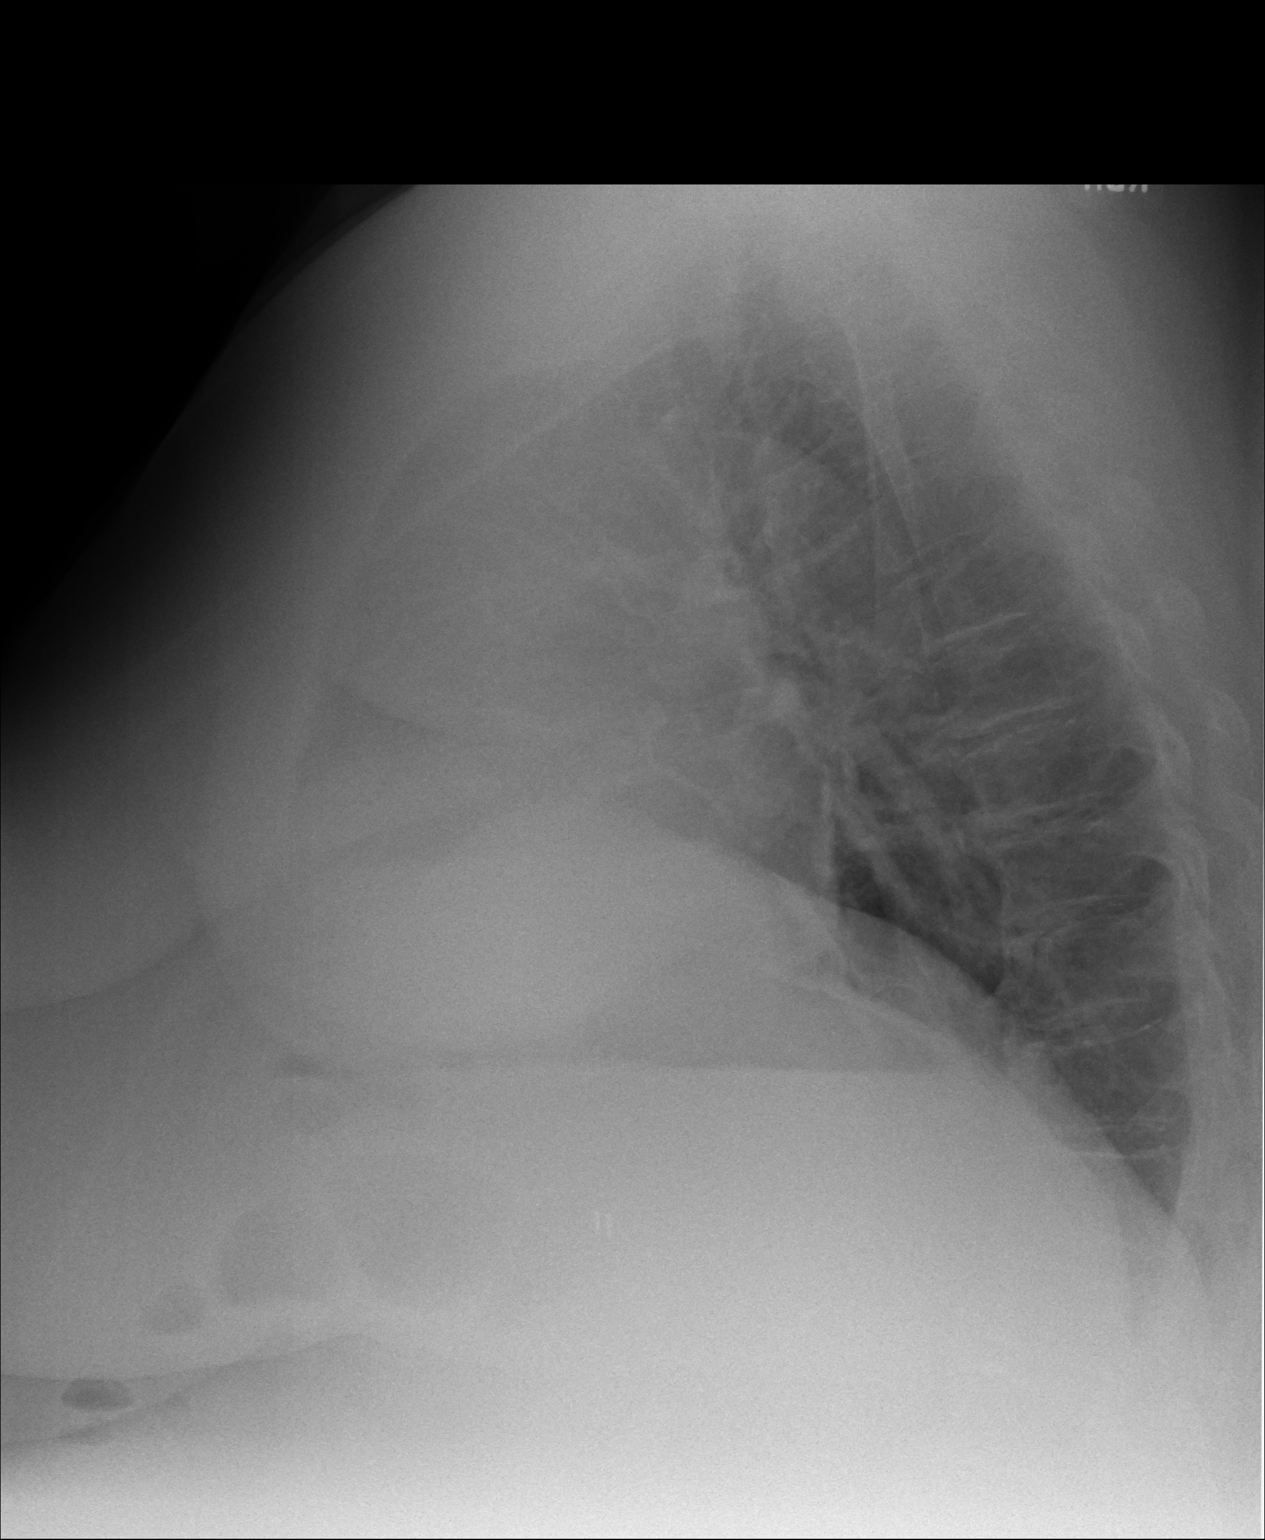
[im 3/3]
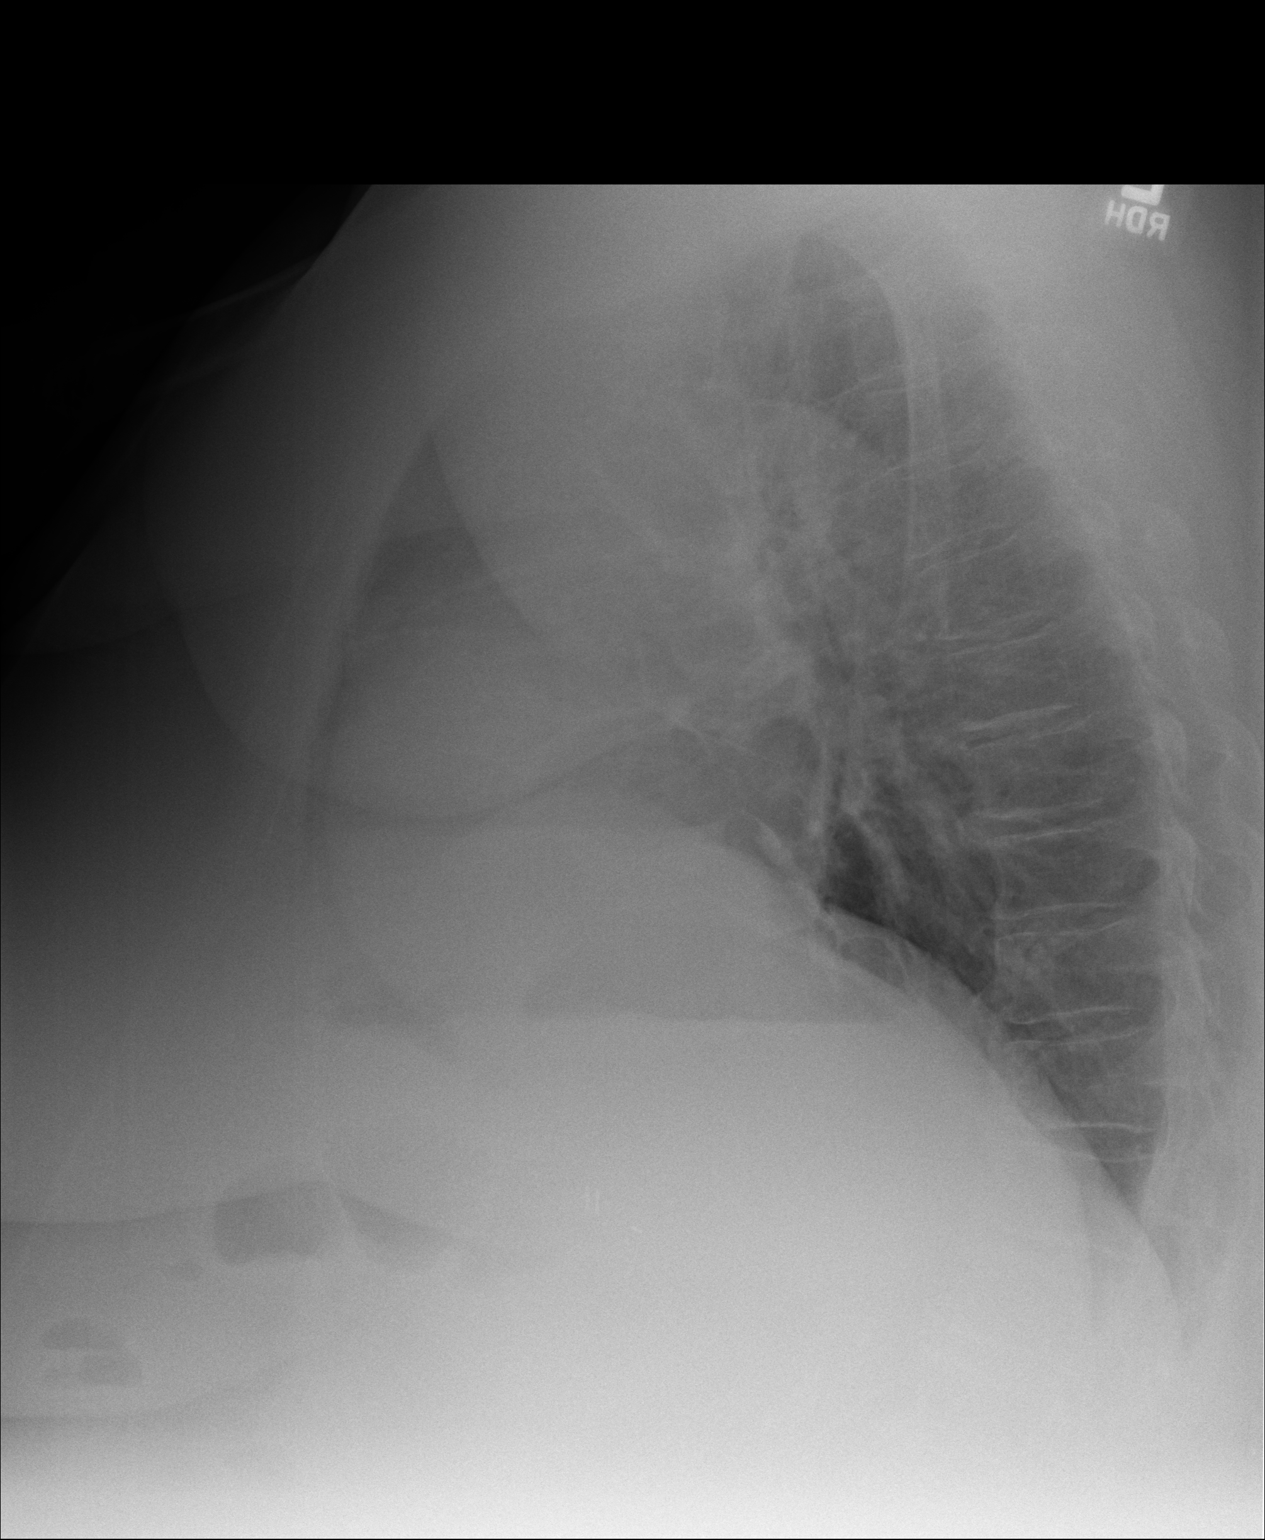

[3 of 3 positions shown; findings below may reference images not displayed]

PROCEDURE:     KDR - KDXR CHEST PA (OR AP) AND LAT  - September 29, 2011  [DATE]

RESULT:     Comparison is made to a prior exam of 01/18/2010. The current exam
shows the lung fields to be clear. No pneumonia, pneumothorax or pleural
effusion is seen. There heart size is normal. No acute bony abnormalities
are identified.
IMPRESSION: No acute changes are identified.

## 2013-08-28 IMAGING — CT CT OF THE LEFT KNEE WITHOUT CONTRAST
1 series · 12 of 14 positions shown, 15 images · non-contrast
Comparison: none

REASON FOR EXAM: lt knee pain eval tibia fracture  fall
COMMENTS:

PROCEDURE:     CT  - CT KNEE LEFT WO  - October 30, 2011 [DATE]
RESULT:
TECHNIQUE: Multislice helical acquisition through the left knee is
reconstructed at bone window settings in the axial, coronal and sagittal
planes. Slice thickness of the reconstructions is 1.0 mm.
There is no previous exam for comparison.

[Series 2: axial · axial · 0.42mm/px · z∈[-244,-106]mm · 12 of 165 slices shown, 15 images]
[im 13/165  soft-tissue]
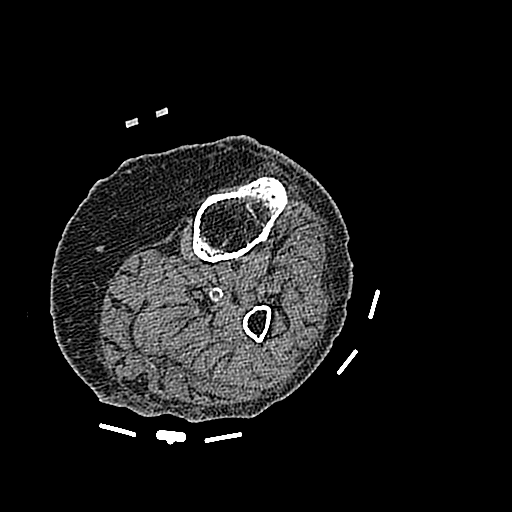
[im 13/165  bone]
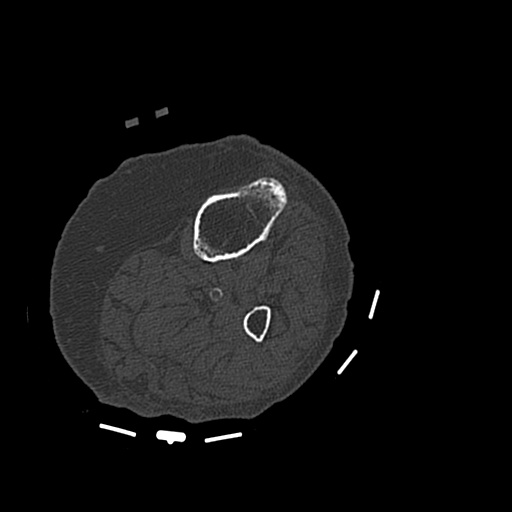
[im 26/165  bone]
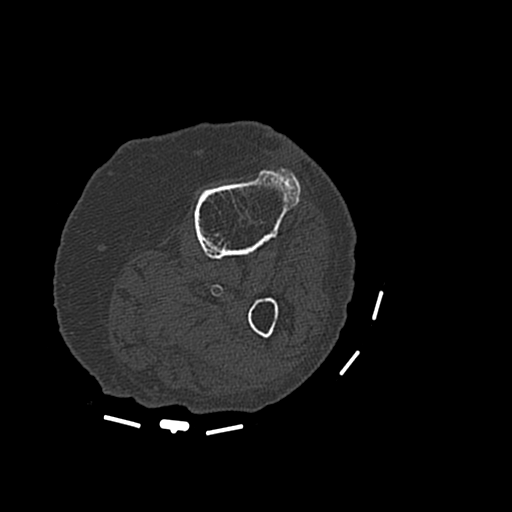
[im 38/165  bone]
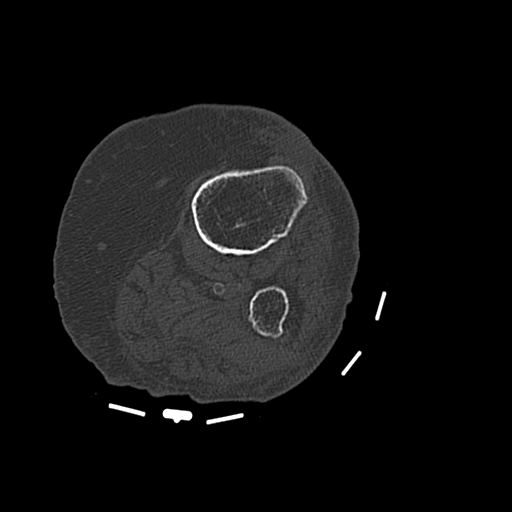
[im 51/165  bone]
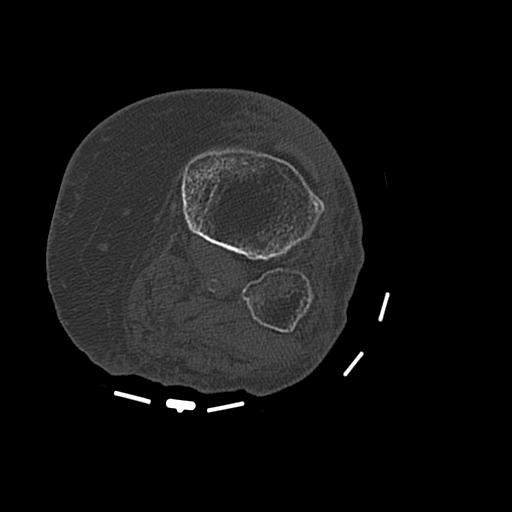
[im 64/165  soft-tissue]
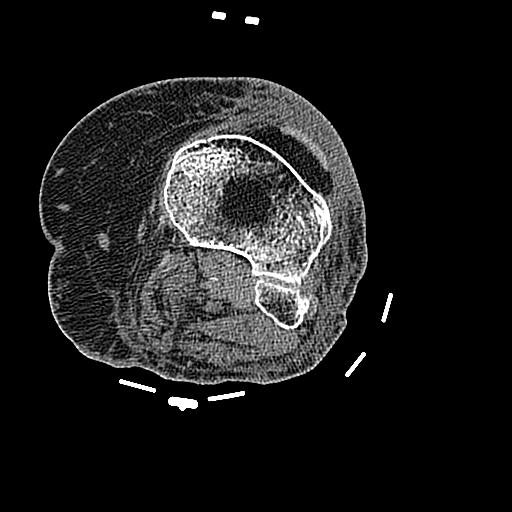
[im 64/165  bone]
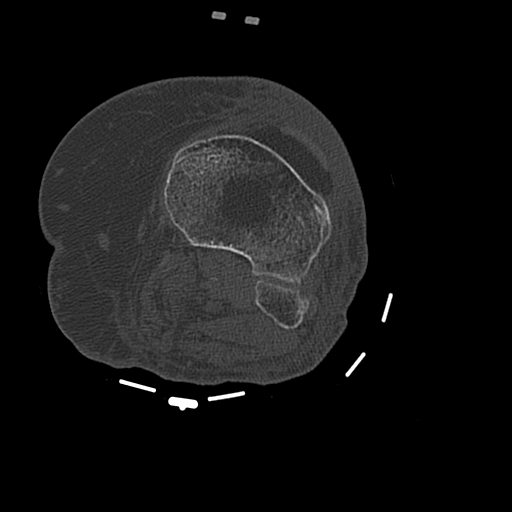
[im 76/165  bone]
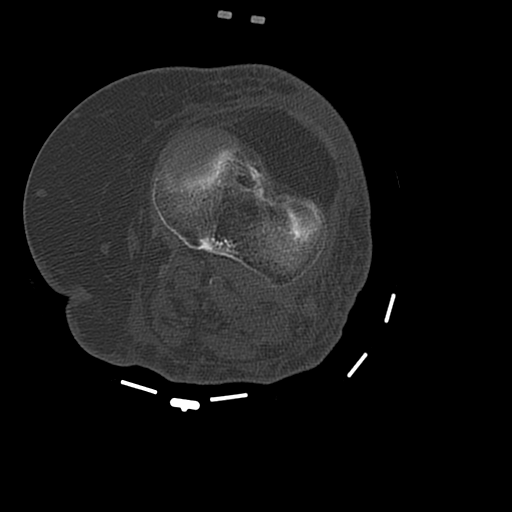
[im 89/165  bone]
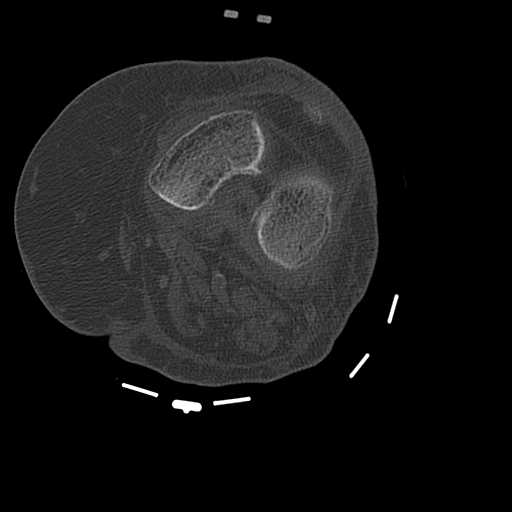
[im 101/165  bone]
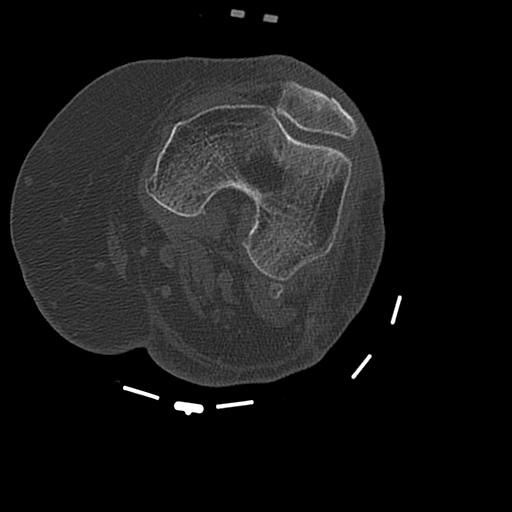
[im 114/165  soft-tissue]
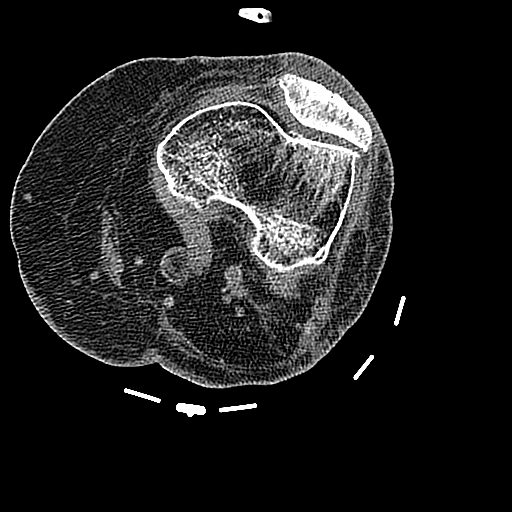
[im 114/165  bone]
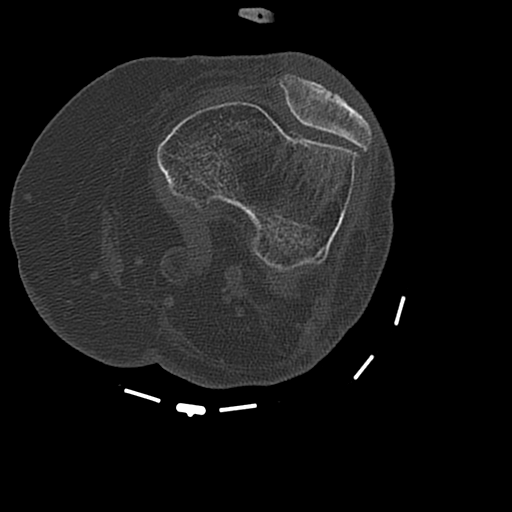
[im 127/165  bone]
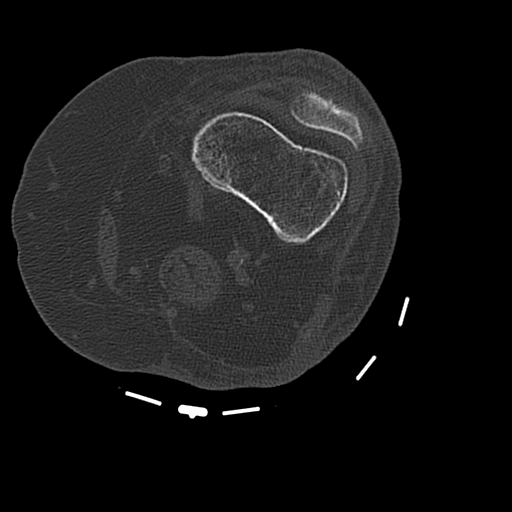
[im 139/165  bone]
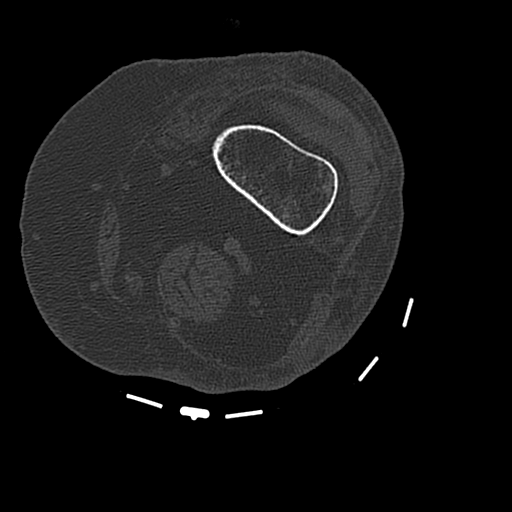
[im 152/165  bone]
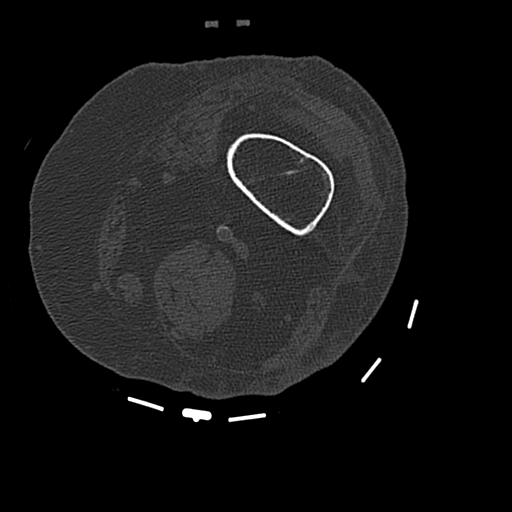

[12 of 14 positions shown; findings below may reference images not displayed]

FINDINGS: There is a vertically oriented fracture within the patella without
distraction. Medial compartmental narrowing is present. The distal femur,
proximal tibia and proximal fibula appear intact. Atherosclerotic
calcification is present. Degenerative marginal hypertrophic spurring is
present. Some lateral compartmental calcification suggests chondrocalcinosis.
IMPRESSION: 1.  Vertically oriented mid patellar fracture without distraction.
2.  Diffuse degenerative changes as described. MRI could be obtained to
investigate the presence of internal derangement or other occult fractures.

## 2013-09-02 ENCOUNTER — Telehealth: Payer: Self-pay | Admitting: Nurse Practitioner

## 2013-09-02 NOTE — Telephone Encounter (Signed)
Calling asking if she should be concerned that her dizziness is lasting longer than usual. She already has an appt for Friday 2/20

## 2013-09-02 NOTE — Telephone Encounter (Signed)
Spoke to patient. She says she is having an increase in symptoms, especially dizziness. Requested earlier appt. Scheduled appt w/ NP-Carolyn on 09/03/13.

## 2013-09-03 ENCOUNTER — Telehealth: Payer: Self-pay | Admitting: Nurse Practitioner

## 2013-09-03 ENCOUNTER — Ambulatory Visit: Payer: Self-pay | Admitting: Nurse Practitioner

## 2013-09-03 NOTE — Telephone Encounter (Signed)
No show for scheduled appt 

## 2013-09-05 ENCOUNTER — Ambulatory Visit: Payer: Medicare Other | Admitting: Nurse Practitioner

## 2013-09-05 ENCOUNTER — Ambulatory Visit: Payer: Commercial Managed Care - HMO | Admitting: Nurse Practitioner

## 2013-09-12 ENCOUNTER — Encounter: Payer: Self-pay | Admitting: Surgery

## 2013-09-14 ENCOUNTER — Encounter: Payer: Self-pay | Admitting: Surgery

## 2013-10-08 ENCOUNTER — Encounter: Payer: Self-pay | Admitting: Nurse Practitioner

## 2013-10-08 ENCOUNTER — Ambulatory Visit (INDEPENDENT_AMBULATORY_CARE_PROVIDER_SITE_OTHER): Payer: Medicare HMO | Admitting: Nurse Practitioner

## 2013-10-08 VITALS — BP 111/64 | HR 80 | Ht 61.5 in | Wt 292.0 lb

## 2013-10-08 DIAGNOSIS — G2581 Restless legs syndrome: Secondary | ICD-10-CM

## 2013-10-08 DIAGNOSIS — I69993 Ataxia following unspecified cerebrovascular disease: Secondary | ICD-10-CM

## 2013-10-08 DIAGNOSIS — I69959 Hemiplegia and hemiparesis following unspecified cerebrovascular disease affecting unspecified side: Secondary | ICD-10-CM

## 2013-10-08 MED ORDER — PRAMIPEXOLE DIHYDROCHLORIDE 0.75 MG PO TABS: 0.7500 mg | ORAL_TABLET | Freq: Two times a day (BID) | ORAL | Status: DC

## 2013-10-08 NOTE — Progress Notes (Signed)
[facility]  PATIENT: Susan House DOB: [birth year]   REASON FOR VISIT: Followup for stroke and restless leg syndrome   HISTORY OF PRESENT ILLNESS: Susan House, 58 year old female returns for followup. She has history of stroke in 2000 and then a posterior circulatory TIA in June of 2010. She is currently on Plavix and aspirin. She denies further stroke or TIA symptoms. She complains of chronic dizziness. She has had admission to the hospital in the past or dizziness which was due to dehydration. Her restless leg syndrome symptoms are well controlled with Mirapex. She denies any compulsive behaviors. She states her blood sugars have been between 130 and 150 fasting. She is on insulin. She returns for reevaluation  She has history of stroke in July, 2000, with residual left hemiparesis and left homonymous hemaniopsia. She experienced a posterior circulation TIA in June, 2010. In October, 2010, she developed horizontal diplopia and was seen at [facility] ER. An MRI was performed that was unchanged from prior studies. 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 The diplopia resolved after a few months and she has had no further problems with her vision. She does report some intermittent dizziness with upgaze.  She complains of tinnitus and wonders if it is related to her other medical conditions. She says that it is not present constantly and does not have a pulsatile quality. She finds it annoying but it is not otherwise bothersome to her.  Patient also has a history of restless leg syndrome for which she takes Mirapex. She denies any compulsive behaviors. She claims she had an ER admission in March for dizziness which she was told was due to dehydration. She received IV fluids.  She is wearing a Unna boot to the left lower leg, she has vascular insufficiency but claims that she has  no skin breakdown. She reports that her CBGs have been within the normal range. She has no new complaints today     REVIEW OF SYSTEMS: Full 14 system review of systems performed and notable only for those listed, all others are neg:  Constitutional: N/A  Cardiovascular: N/A  Ear/Nose/Throat: N/A  Skin: N/A  Eyes: N/A  Respiratory: Cough  Gastroitestinal: Frequency of urine  Hematology/Lymphatic: N/A  Endocrine: N/A Musculoskeletal:N/A  Allergy/Immunology: N/A  Neurological: Dizziness  Psychiatric: N/A   ALLERGIES: Allergies  Allergen Reactions  . Morphine And Related Anaphylaxis  . Betadine [Povidone Iodine] Rash  . Tetracyclines & Related Rash    HOME MEDICATIONS: Outpatient Prescriptions Prior to Visit  Medication Sig Dispense Refill  . amLODipine (NORVASC) 5 MG tablet Take 5 mg by mouth daily.      Marland Kitchen aspirin 325 MG EC tablet Take 325 mg by mouth daily.      Marland Kitchen atorvastatin (LIPITOR) 20 MG tablet Take 20 mg by mouth at bedtime.      . baclofen (LIORESAL) 20 MG tablet Take 20 mg by mouth 2 (two) times daily.      . carvedilol (COREG) 6.25 MG tablet 1/2 tab by mouth twice daily.      . citalopram (CELEXA) 40 MG tablet Take 40 mg by mouth daily.      . clopidogrel (PLAVIX) 75 MG tablet Take 75 mg by mouth daily.      . enalapril (VASOTEC) 20 MG tablet Take 20 mg by mouth 2 (two) times daily.      . hydrochlorothiazide (  HYDRODIURIL) 25 MG tablet Take 25 mg by mouth daily.      . insulin NPH-regular (NOVOLIN 70/30) (70-30) 100 UNIT/ML injection Inject 15 Units into the skin 2 (two) times daily.      . metFORMIN (GLUCOPHAGE) 1000 MG tablet Take 1,000 mg by mouth 2 (two) times daily.      Marland Kitchen oxybutynin (DITROPAN-XL) 5 MG 24 hr tablet Take 5 mg by mouth 4 (four) times daily.      . pantoprazole (PROTONIX) 40 MG tablet Take 40 mg by mouth daily.      . pramipexole (MIRAPEX) 0.75 MG tablet Take 1 tablet (0.75 mg total) by mouth 2 (two) times daily.  180 tablet  0  . Prenatal Vit-Fe  Sulfate-FA (PRENATAL VITAMIN PO) Take by mouth.       No facility-administered medications prior to visit.    PAST MEDICAL HISTORY: Past Medical History  Diagnosis Date  . Cellulitis and abscess of face   . Allergy   . Depression   . Hypertension   . Edema   . Arthritis   . Migraine   . GERD (gastroesophageal reflux disease)   . Stroke   . Diabetes mellitus without complication   . Hyperlipidemia   . Obesity   . IBS (irritable bowel syndrome)   . Urinary incontinence     PAST SURGICAL HISTORY: Past Surgical History  Procedure Laterality Date  . Colonoscopy  2010  . Appendectomy    . Cesarean section    . Hernia repair    . Cholecystectomy    . Eye surgery    . Abdominal hysterectomy    . Upper gi endoscopy    . Carpel tunnel surgery      FAMILY HISTORY: History reviewed. No pertinent family history.  SOCIAL HISTORY: History   Social History  . Marital Status: Married    Spouse Name: Marcello Moores     Number of Children: 2  . Years of Education: 15   Occupational History  . Disabled     Social History Main Topics  . Smoking status: Never Smoker   . Smokeless tobacco: Never Used  . Alcohol Use: No  . Drug Use: No  . Sexual Activity: Not on file   Other Topics Concern  . Not on file   Social History Narrative   Patient lives at home with husband Marcello Moores.    Patient has 2 children and 2 step children.    Patient has 12+ years of education.    Patient is Disabled.      PHYSICAL EXAM  Filed Vitals:   10/08/13 1103 10/08/13 1106  BP: 117/60 111/64  Pulse: 72 80  Height: 5' 1.5" (1.562 m)   Weight: 292 lb (132.45 kg)    Body mass index is 54.29 kg/(m^2).  Generalized: Well developed, morbidly obese female in no acute distress  Head: normocephalic and atraumatic,. Oral cavity dry and tongue is cracked.   Neck: Supple, no carotid bruits  Cardiac: Regular rate rhythm, no murmur  Musculoskeletal: Claw like deformity of the left hand  Neurological  examination   Mentation: Alert oriented to time, place, history taking. Follows all commands speech and language fluent  Cranial nerve II-XII: Fundoscopic exam reveals sharp disc margins.Pupils were equal round reactive to light extraocular movements with left homonympous hemianopsia,  visual field were full on confrontational test. Facial sensation and strength were normal. hearing was intact to finger rubbing bilaterally. Uvula tongue midline. head turning and shoulder shrug were normal and symmetric.Tongue  protrusion into cheek strength was normal. Motor: normal bulk and tone, full strength on the right side, spastic hemiparesis with left lower extremity 4/5 left foot drop. The left upper arm has 4/5 strength of the arm and  2/5 strength with a clawlike deformity and increased tone of the hand  Sensory: normal and symmetric to light touch, pinprick, and  vibration on the right, diminished to touch and temperature on the left, pinprick and vibratory are normal  Coordination: finger-nose-finger, heel-to-shin bilaterally, normal on the right significantly diminished on the left  Reflexes: 2+ and symmetrical on the left ,  1+ on the right, toes downgoing on the right upgoing on the left. Gait and Station: Rising up from seated position without assistance, using a rolling walker, unsteady gait, difficulty with turning obvious foot drop on the left  DIAGNOSTIC DATA (LABS, IMAGING, TESTING) -     ASSESSMENT AND PLAN  58 y.o. year old female  has a past medical history of stroke in 2000 with residual left hemiparesis and left homonymous hemianopsia. Posterior circulatory TIA in June 2010, restless leg syndrome continued dizziness probably due to dehydration  Continue Plavix and aspirin for secondary stroke prevention Continue Mirapex for restless leg syndrome will refill with mail order Given information on dehydration, she needs to increase her fluids Followup in 6 months next visit with Dr.  De Nurse, [facility], [facility], [facility] [address], [place], [place] [address] [phone]

## 2013-10-08 NOTE — Patient Instructions (Signed)
Continue Plavix and aspirin for secondary stroke prevention Continue Mirapex for restless leg syndrome will refill with mail order Given information on dehydration, she needs to increase her fluids Followup in 6 months next visit with Dr. Brett Fairy

## 2013-10-13 IMAGING — US US EXTREM LOW VENOUS*L*
1 series · 14 of 18 positions shown · non-contrast
Comparison: none

REASON FOR EXAM: CR [id number] left leg pain and swelling
COMMENTS:

PROCEDURE:     US  - US DOPPLER LOW EXTR LEFT  - December 15, 2011  [DATE]
RESULT:     Left lower extremity color flow duplex Doppler reveals no
evidence of deep venous thrombosis.

[Series 1: us extrem low venous*left* · 0.13mm/px · 14 of 18 slices shown]
[im 1/18]
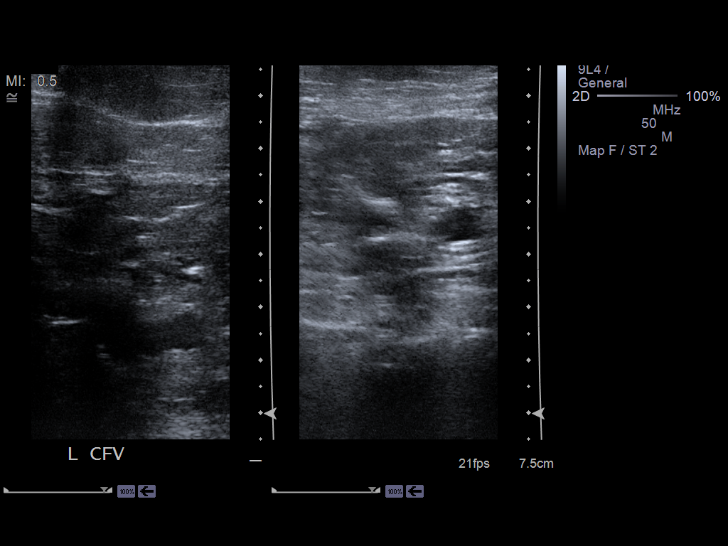
[im 2/18]
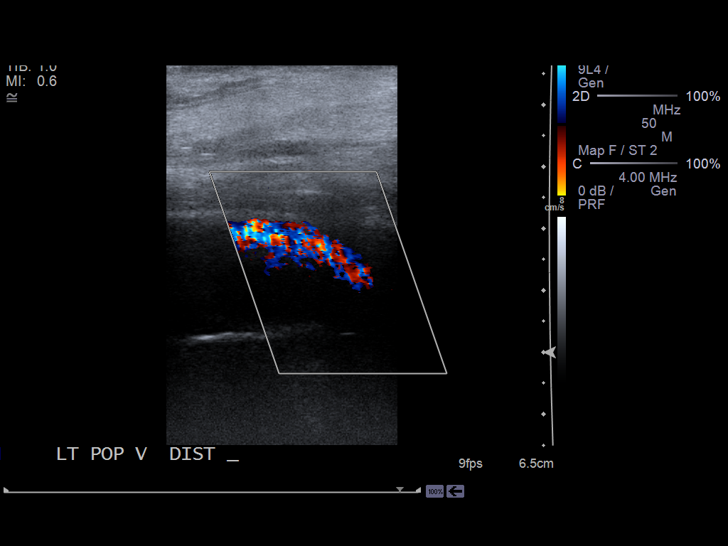
[im 4/18]
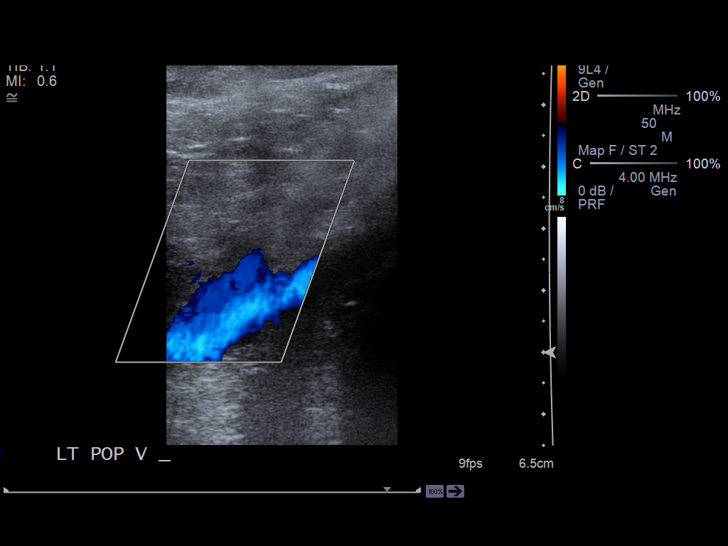
[im 5/18]
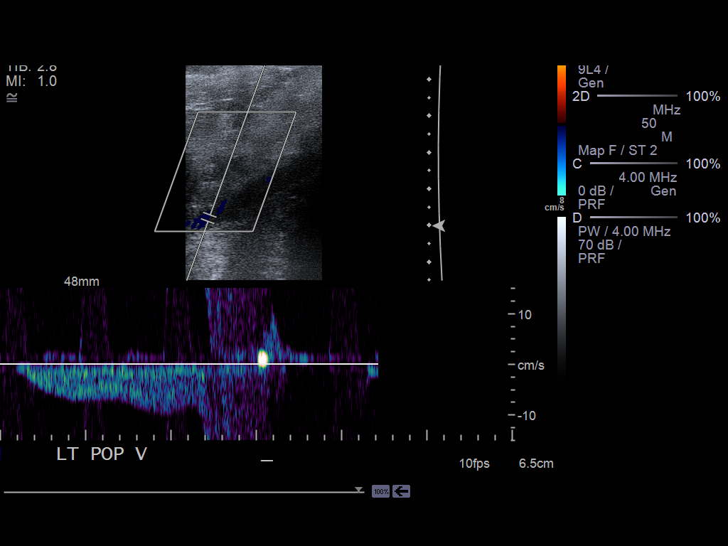
[im 6/18]
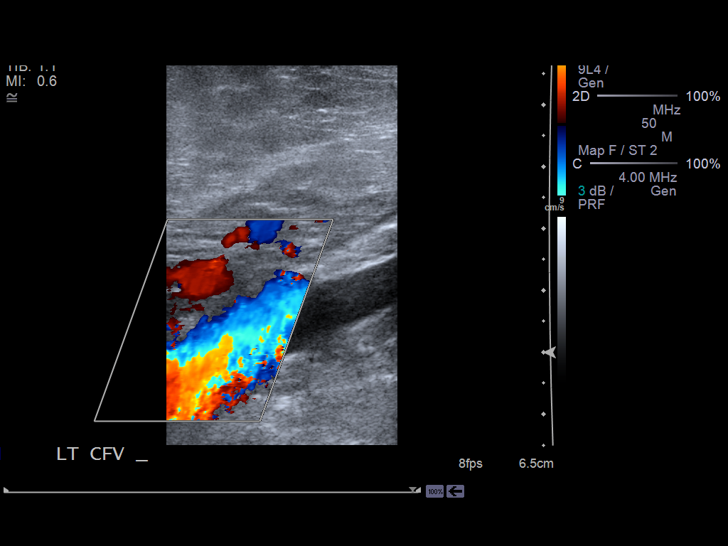
[im 8/18]
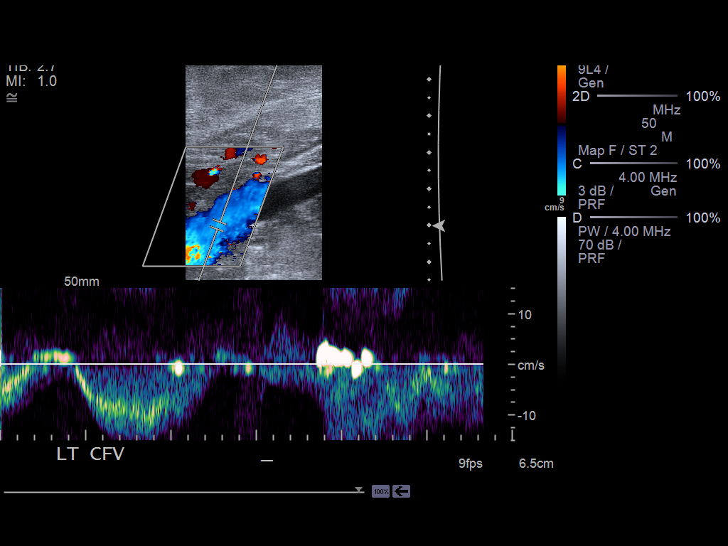
[im 9/18]
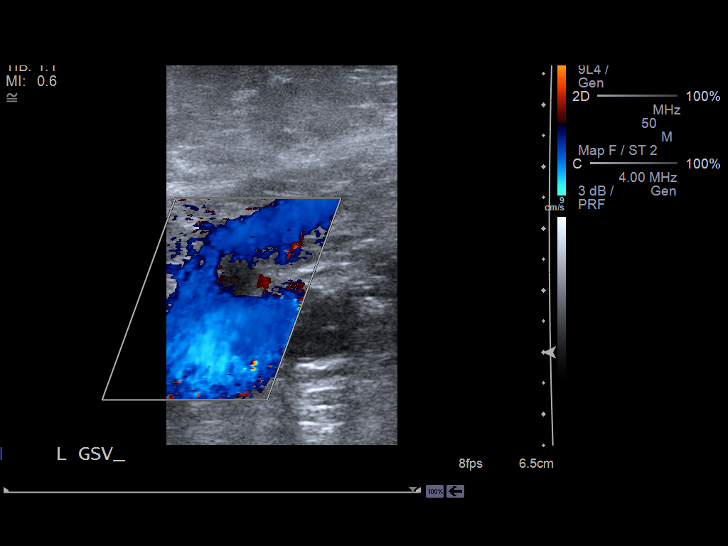
[im 10/18]
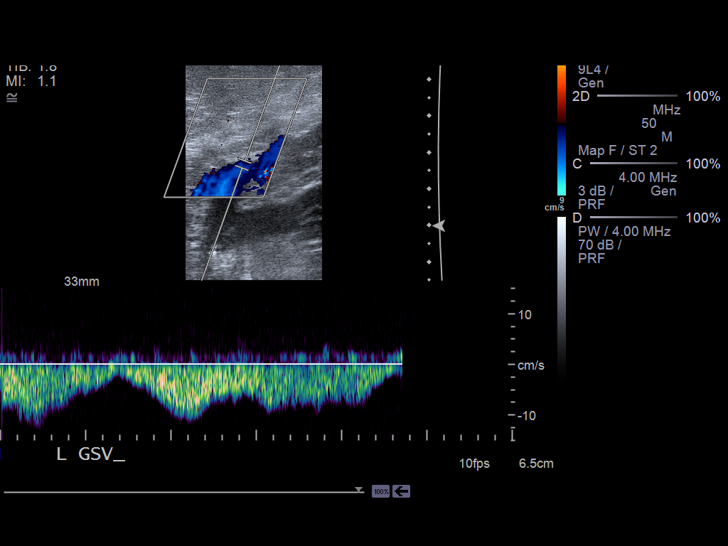
[im 11/18]
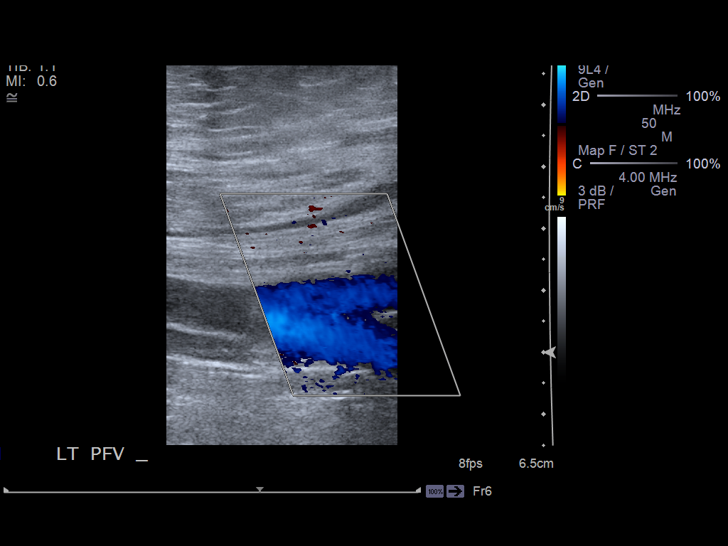
[im 13/18]
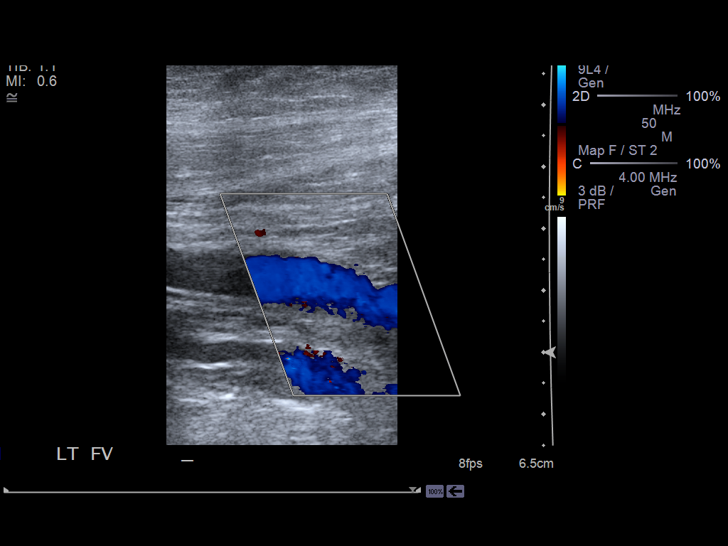
[im 14/18]
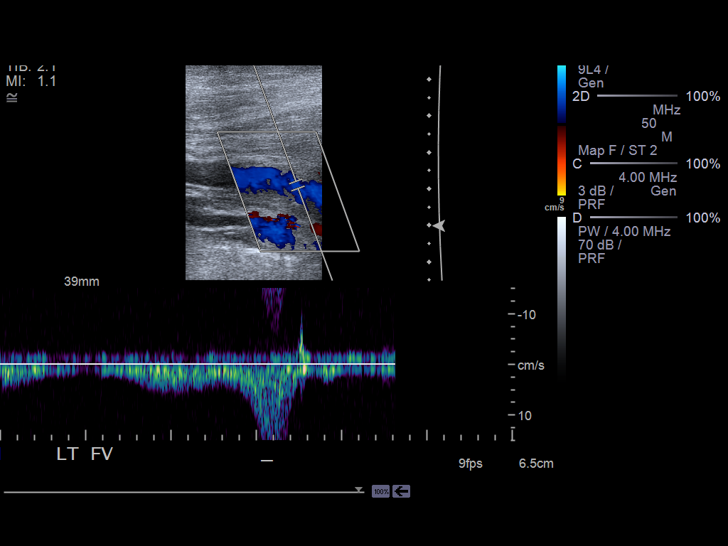
[im 15/18]
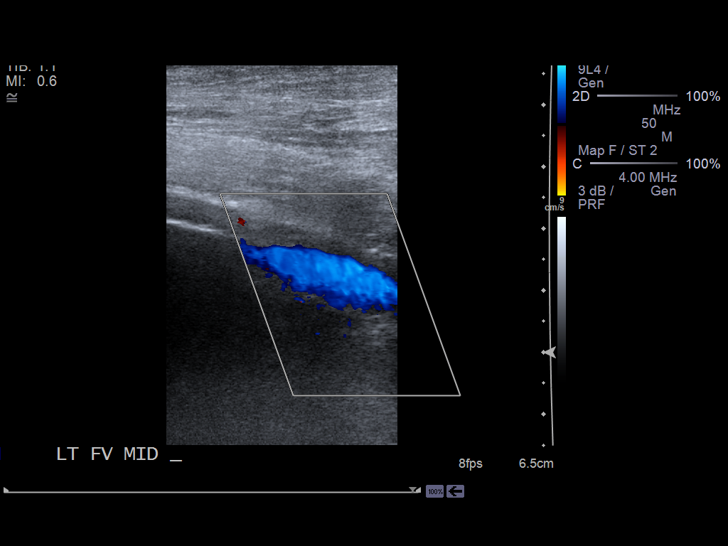
[im 17/18]
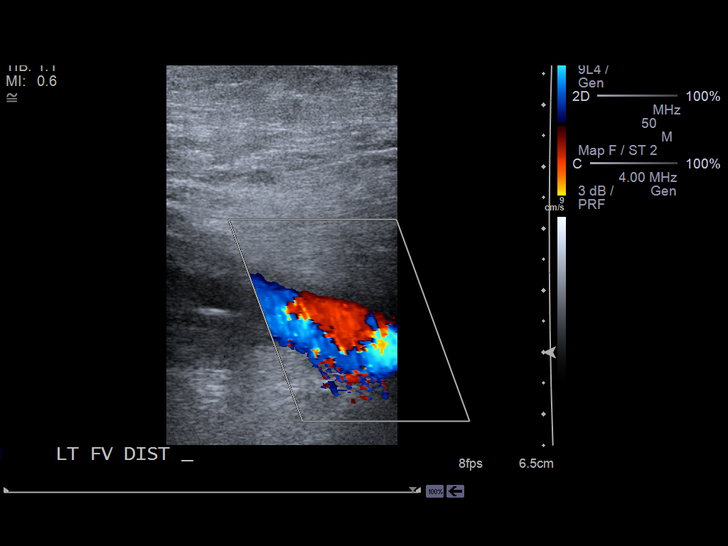
[im 18/18]
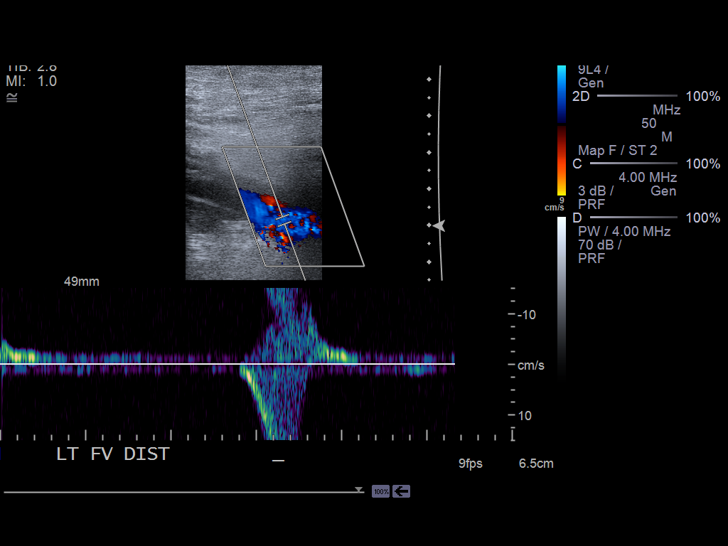

[14 of 18 positions shown; findings below may reference images not displayed]

IMPRESSION: Negative exam.

## 2013-10-17 ENCOUNTER — Encounter: Payer: Self-pay | Admitting: Surgery

## 2013-10-30 ENCOUNTER — Telehealth: Payer: Self-pay | Admitting: Neurology

## 2013-10-30 MED ORDER — PRAMIPEXOLE DIHYDROCHLORIDE 0.75 MG PO TABS: 0.7500 mg | ORAL_TABLET | Freq: Two times a day (BID) | ORAL | Status: DC

## 2013-10-30 NOTE — Telephone Encounter (Signed)
Mirapex was sent to the pharmacy on 03/25.  I called the patient back to verify pharmacy info.  She said she does use [facility].  Tokd her we will resend the Rx. She will call us back if anything further is needed.   pramipexole (MIRAPEX) 0.75 MG tablet 180 tablet 1 10/08/2013     Sig - Route: Take 1 tablet (0.75 mg total) by mouth 2 (two) times daily. - Oral    E-Prescribing Status: Receipt confirmed by pharmacy (10/08/2013 11:58 AM EDT)                Pharmacy    [address] DELIVERY - [place], [place]

## 2013-10-30 NOTE — Telephone Encounter (Signed)
Patient called to state that her pharmacy hasn't received anything regarding Mirapex script. Please call and advise patient.

## 2013-10-31 ENCOUNTER — Telehealth: Payer: Self-pay | Admitting: *Deleted

## 2013-10-31 NOTE — Telephone Encounter (Signed)
Pt was seen here in May 2014 with swelling in her left leg and had an unna boot put on she said it helped it then. She stated that for about 9-10 months it was started swelling again and she was just wondering if there is anything she can do to help the swelling go down or does she need to come in again for another unna boot to see if that would help.

## 2013-11-18 NOTE — Telephone Encounter (Signed)
Needs appt to see if unna boot is appropricate

## 2013-12-01 ENCOUNTER — Ambulatory Visit: Payer: Medicare HMO | Admitting: General Surgery

## 2013-12-03 ENCOUNTER — Ambulatory Visit (INDEPENDENT_AMBULATORY_CARE_PROVIDER_SITE_OTHER): Payer: Commercial Managed Care - HMO | Admitting: General Surgery

## 2013-12-03 ENCOUNTER — Encounter: Payer: Self-pay | Admitting: General Surgery

## 2013-12-03 VITALS — BP 132/74 | HR 70 | Resp 16 | Ht 61.0 in | Wt 292.0 lb

## 2013-12-03 DIAGNOSIS — I872 Venous insufficiency (chronic) (peripheral): Secondary | ICD-10-CM

## 2013-12-03 NOTE — Patient Instructions (Addendum)
Continue compression hose and elevate lower extremity when not walking.

## 2013-12-03 NOTE — Progress Notes (Signed)
Patient ID: Marciano Sequin, female   DOB: Jan 15, 1956, 58 y.o.   MRN: [id number]  Chief Complaint  Patient presents with  . Other    left left swelling    HPI RANIM HERTLING is a 58 y.o. female here today for left legs swelling . Patient states this has been going on for about one month. Jon Gills NP applied a unna boot then it it seemed to have gotten better.  HPI  Past Medical History  Diagnosis Date  . Cellulitis and abscess of face   . Allergy   . Depression   . Hypertension   . Edema   . Arthritis   . Migraine   . GERD (gastroesophageal reflux disease)   . Stroke   . Diabetes mellitus without complication   . Hyperlipidemia   . Obesity   . IBS (irritable bowel syndrome)   . Urinary incontinence     Past Surgical History  Procedure Laterality Date  . Colonoscopy  2010  . Appendectomy    . Cesarean section    . Hernia repair    . Cholecystectomy    . Eye surgery    . Abdominal hysterectomy    . Upper gi endoscopy    . Carpel tunnel surgery      History reviewed. No pertinent family history.  Social History History  Substance Use Topics  . Smoking status: Never Smoker   . Smokeless tobacco: Never Used  . Alcohol Use: No    Allergies  Allergen Reactions  . Morphine And Related Anaphylaxis  . Betadine [Povidone Iodine] Rash  . Tetracyclines & Related Rash    Current Outpatient Prescriptions  Medication Sig Dispense Refill  . amLODipine (NORVASC) 5 MG tablet Take 5 mg by mouth daily.      Marland Kitchen aspirin 325 MG EC tablet Take 325 mg by mouth daily.      Marland Kitchen atorvastatin (LIPITOR) 20 MG tablet Take 20 mg by mouth at bedtime.      . baclofen (LIORESAL) 20 MG tablet Take 20 mg by mouth 2 (two) times daily.      . citalopram (CELEXA) 40 MG tablet Take 40 mg by mouth daily.      . clopidogrel (PLAVIX) 75 MG tablet Take 75 mg by mouth daily.      . enalapril (VASOTEC) 20 MG tablet Take 20 mg by mouth 2 (two) times daily.      . hydrochlorothiazide (HYDRODIURIL) 25 MG  tablet Take 25 mg by mouth daily.      . insulin NPH-regular (NOVOLIN 70/30) (70-30) 100 UNIT/ML injection Inject 15 Units into the skin 2 (two) times daily.      . metFORMIN (GLUCOPHAGE) 1000 MG tablet Take 1,000 mg by mouth 2 (two) times daily.      Marland Kitchen oxybutynin (DITROPAN-XL) 5 MG 24 hr tablet Take 5 mg by mouth 4 (four) times daily.      . pantoprazole (PROTONIX) 40 MG tablet Take 40 mg by mouth daily.      . pramipexole (MIRAPEX) 0.75 MG tablet Take 1 tablet (0.75 mg total) by mouth 2 (two) times daily.  180 tablet  1  . Prenatal Vit-Fe Sulfate-FA (PRENATAL VITAMIN PO) Take by mouth.       No current facility-administered medications for this visit.    Review of Systems Review of Systems  Constitutional: Negative.   Cardiovascular: Positive for leg swelling. Negative for chest pain and palpitations.    Blood pressure 132/74, pulse 70, resp. rate  16, height 5\' 1"  (1.549 m), weight 292 lb (132.45 kg).  Physical Exam Physical Exam  Constitutional: She is oriented to person, place, and time. She appears well-developed and well-nourished.  Cardiovascular:  Pulses:      Posterior tibial pulses are 2+ on the right side, and 2+ on the left side.  Lower leg edema noted, left > right.  Neurological: She is alert and oriented to person, place, and time.  Skin: Skin is warm and dry.  No stasis changes noted.   Data Reviewed none  Assessment    Chronic venous insufficiency, no skin problems now. Feel left leg edema is aggravated by her left hemiparesis. She is using 20-30 mm hg stocking now. Advised to use 30-40 mm stocking for left side and to elevate left leg when not up walking.      Plan    Continue compression hose and elevate lower extremity.       Jassmin Kemmerer G Chevy Sweigert 12/03/2013, 1:11 PM

## 2014-01-06 ENCOUNTER — Encounter: Payer: Self-pay | Admitting: General Surgery

## 2014-01-06 ENCOUNTER — Ambulatory Visit (INDEPENDENT_AMBULATORY_CARE_PROVIDER_SITE_OTHER): Payer: Medicare HMO | Admitting: General Surgery

## 2014-01-06 VITALS — BP 172/82 | HR 64 | Resp 18 | Ht 62.0 in | Wt 293.0 lb

## 2014-01-06 DIAGNOSIS — I8310 Varicose veins of unspecified lower extremity with inflammation: Secondary | ICD-10-CM

## 2014-01-06 DIAGNOSIS — I872 Venous insufficiency (chronic) (peripheral): Secondary | ICD-10-CM

## 2014-01-06 NOTE — Patient Instructions (Addendum)
Udder cream daily when she resumes use of compression hose.

## 2014-01-06 NOTE — Progress Notes (Signed)
Patient ID: Susan House, female   DOB: Jan 08, 1956, 58 y.o.   MRN: [id number]  Chief Complaint  Patient presents with  . Follow-up    left leg sores    HPI Susan House is a 58 y.o. female. Here today for evaluation of left lower leg sores. States she noticed them Friday night. States the largest one started as a blister. She has noticed some swelling but has been keeping her legs elevated. Using antibiotic ointment and gauze.   HPI  Past Medical History  Diagnosis Date  . Cellulitis and abscess of face   . Allergy   . Depression   . Hypertension   . Edema   . Arthritis   . Migraine   . GERD (gastroesophageal reflux disease)   . Stroke   . Diabetes mellitus without complication   . Hyperlipidemia   . Obesity   . IBS (irritable bowel syndrome)   . Urinary incontinence     Past Surgical History  Procedure Laterality Date  . Colonoscopy  2010  . Appendectomy    . Cesarean section    . Hernia repair    . Cholecystectomy    . Eye surgery    . Abdominal hysterectomy    . Upper gi endoscopy    . Carpel tunnel surgery      History reviewed. No pertinent family history.  Social History History  Substance Use Topics  . Smoking status: Never Smoker   . Smokeless tobacco: Never Used  . Alcohol Use: No    Allergies  Allergen Reactions  . Morphine And Related Anaphylaxis  . Betadine [Povidone Iodine] Rash  . Tetracyclines & Related Rash    Current Outpatient Prescriptions  Medication Sig Dispense Refill  . amLODipine (NORVASC) 5 MG tablet Take 5 mg by mouth daily.      Susan House aspirin 325 MG EC tablet Take 325 mg by mouth daily.      Susan House atorvastatin (LIPITOR) 20 MG tablet Take 20 mg by mouth at bedtime.      . baclofen (LIORESAL) 20 MG tablet Take 20 mg by mouth 2 (two) times daily.      . citalopram (CELEXA) 40 MG tablet Take 40 mg by mouth daily.      . clopidogrel (PLAVIX) 75 MG tablet Take 75 mg by mouth daily.      . enalapril (VASOTEC) 20 MG tablet Take 20 mg by  mouth 2 (two) times daily.      . hydrochlorothiazide (HYDRODIURIL) 25 MG tablet Take 25 mg by mouth daily.      . insulin NPH-regular (NOVOLIN 70/30) (70-30) 100 UNIT/ML injection Inject 15 Units into the skin 2 (two) times daily.      . metFORMIN (GLUCOPHAGE) 1000 MG tablet Take 1,000 mg by mouth 2 (two) times daily.      Susan House oxybutynin (DITROPAN-XL) 5 MG 24 hr tablet Take 5 mg by mouth 4 (four) times daily.      . pantoprazole (PROTONIX) 40 MG tablet Take 40 mg by mouth daily.      . pramipexole (MIRAPEX) 0.75 MG tablet Take 1 tablet (0.75 mg total) by mouth 2 (two) times daily.  180 tablet  1  . Prenatal Vit-Fe Sulfate-FA (PRENATAL VITAMIN PO) Take by mouth.       No current facility-administered medications for this visit.    Review of Systems Review of Systems  Constitutional: Negative.   Respiratory: Negative.   Cardiovascular: Negative.     Blood pressure 172/82,  pulse 64, resp. rate 18, height 5\' 2"  (1.575 m), weight 293 lb (132.904 kg).  Physical Exam Physical Exam  Constitutional: She is oriented to person, place, and time. She appears well-developed and well-nourished.  Neurological: She is alert and oriented to person, place, and time.  Skin: Skin is warm and dry.  Mild statis dermatitis involving the left leg. A few small skin breaks are noted. No active drainage, moderate edema to the leg.    Data Reviewed none  Assessment    Mild statis dermatitis involving the left leg. A few small skin breaks are noted. No active drainage, moderate edema to the leg.    Plan    Unna boot application for 2 weeks. Once stasis changes resolve can resume use of compression hose. Also recommended use of udder cream       Susan House G 01/07/2014, 7:09 AM

## 2014-01-07 ENCOUNTER — Encounter: Payer: Self-pay | Admitting: General Surgery

## 2014-01-07 LAB — HEMOGLOBIN A1C: Hgb A1c MFr Bld: 8.8 % — AB (ref 4.0–6.0)

## 2014-01-13 ENCOUNTER — Ambulatory Visit (INDEPENDENT_AMBULATORY_CARE_PROVIDER_SITE_OTHER): Payer: Medicare HMO | Admitting: *Deleted

## 2014-01-13 DIAGNOSIS — I8310 Varicose veins of unspecified lower extremity with inflammation: Secondary | ICD-10-CM

## 2014-01-13 DIAGNOSIS — I872 Venous insufficiency (chronic) (peripheral): Secondary | ICD-10-CM

## 2014-01-13 NOTE — Progress Notes (Signed)
Here today for unna boot dressing change left leg. There is a 3 x 1 ulceration left lateral leg. Edema improving. Unna boot reapplied.

## 2014-01-13 NOTE — Patient Instructions (Signed)
Follow up in one week

## 2014-01-17 ENCOUNTER — Observation Stay: Payer: Self-pay | Admitting: Internal Medicine

## 2014-01-17 LAB — CBC
HCT: 35.2 % (ref 35.0–47.0)
HGB: 11.2 g/dL — AB (ref 12.0–16.0)
MCH: 26 pg (ref 26.0–34.0)
MCHC: 31.8 g/dL — ABNORMAL LOW (ref 32.0–36.0)
MCV: 82 fL (ref 80–100)
Platelet: 215 10*3/uL (ref 150–440)
RBC: 4.3 10*6/uL (ref 3.80–5.20)
RDW: 17.2 % — ABNORMAL HIGH (ref 11.5–14.5)
WBC: 8.3 10*3/uL (ref 3.6–11.0)

## 2014-01-17 LAB — COMPREHENSIVE METABOLIC PANEL
Albumin: 3.4 g/dL (ref 3.4–5.0)
Alkaline Phosphatase: 120 U/L — ABNORMAL HIGH
Anion Gap: 9 (ref 7–16)
BUN: 31 mg/dL — ABNORMAL HIGH (ref 7–18)
Bilirubin,Total: 0.3 mg/dL (ref 0.2–1.0)
CALCIUM: 8.9 mg/dL (ref 8.5–10.1)
CHLORIDE: 105 mmol/L (ref 98–107)
CREATININE: 1.39 mg/dL — AB (ref 0.60–1.30)
Co2: 23 mmol/L (ref 21–32)
EGFR (African American): 49 — ABNORMAL LOW
GFR CALC NON AF AMER: 42 — AB
Glucose: 247 mg/dL — ABNORMAL HIGH (ref 65–99)
Osmolality: 289 (ref 275–301)
POTASSIUM: 4.2 mmol/L (ref 3.5–5.1)
SGOT(AST): 23 U/L (ref 15–37)
SGPT (ALT): 20 U/L (ref 12–78)
SODIUM: 137 mmol/L (ref 136–145)
Total Protein: 7 g/dL (ref 6.4–8.2)

## 2014-01-17 LAB — URINALYSIS, COMPLETE
BLOOD: NEGATIVE
Bilirubin,UR: NEGATIVE
KETONE: NEGATIVE
NITRITE: NEGATIVE
Ph: 5 (ref 4.5–8.0)
Protein: 30
SPECIFIC GRAVITY: 1.014 (ref 1.003–1.030)

## 2014-01-17 LAB — HEMOGLOBIN A1C: Hemoglobin A1C: 9.6 % — ABNORMAL HIGH (ref 4.2–6.3)

## 2014-01-17 LAB — TROPONIN I
Troponin-I: <0.02 ng/mL
Troponin-I: <0.02 ng/mL

## 2014-01-18 LAB — CBC WITH DIFFERENTIAL/PLATELET
BASOS ABS: 0 10*3/uL (ref 0.0–0.1)
Basophil %: 0.6 %
EOS PCT: 5.4 %
Eosinophil #: 0.4 10*3/uL (ref 0.0–0.7)
HCT: 31.8 % — AB (ref 35.0–47.0)
HGB: 10.1 g/dL — AB (ref 12.0–16.0)
LYMPHS ABS: 1.4 10*3/uL (ref 1.0–3.6)
Lymphocyte %: 17.7 %
MCH: 25.5 pg — ABNORMAL LOW (ref 26.0–34.0)
MCHC: 31.7 g/dL — ABNORMAL LOW (ref 32.0–36.0)
MCV: 81 fL (ref 80–100)
MONOS PCT: 8.4 %
Monocyte #: 0.7 x10 3/mm (ref 0.2–0.9)
Neutrophil #: 5.4 10*3/uL (ref 1.4–6.5)
Neutrophil %: 67.9 %
Platelet: 221 10*3/uL (ref 150–440)
RBC: 3.95 10*6/uL (ref 3.80–5.20)
RDW: 16.4 % — ABNORMAL HIGH (ref 11.5–14.5)
WBC: 7.9 10*3/uL (ref 3.6–11.0)

## 2014-01-18 LAB — LIPID PANEL
Cholesterol: 110 mg/dL (ref 0–200)
HDL Cholesterol: 35 mg/dL — ABNORMAL LOW (ref 40–60)
LDL CHOLESTEROL, CALC: 51 mg/dL (ref 0–100)
Triglycerides: 122 mg/dL (ref 0–200)
VLDL CHOLESTEROL, CALC: 24 mg/dL (ref 5–40)

## 2014-01-18 LAB — COMPREHENSIVE METABOLIC PANEL
ALBUMIN: 3 g/dL — AB (ref 3.4–5.0)
ALK PHOS: 97 U/L
Anion Gap: 8 (ref 7–16)
BILIRUBIN TOTAL: 0.3 mg/dL (ref 0.2–1.0)
BUN: 25 mg/dL — ABNORMAL HIGH (ref 7–18)
CALCIUM: 8.7 mg/dL (ref 8.5–10.1)
CHLORIDE: 105 mmol/L (ref 98–107)
CO2: 25 mmol/L (ref 21–32)
CREATININE: 1.26 mg/dL (ref 0.60–1.30)
EGFR (African American): 55 — ABNORMAL LOW
EGFR (Non-African Amer.): 47 — ABNORMAL LOW
Glucose: 152 mg/dL — ABNORMAL HIGH (ref 65–99)
Osmolality: 283 (ref 275–301)
POTASSIUM: 3.5 mmol/L (ref 3.5–5.1)
SGOT(AST): 21 U/L (ref 15–37)
SGPT (ALT): 18 U/L (ref 12–78)
SODIUM: 138 mmol/L (ref 136–145)
Total Protein: 6.5 g/dL (ref 6.4–8.2)

## 2014-01-18 LAB — TROPONIN I: Troponin-I: <0.02 ng/mL

## 2014-01-19 LAB — URINE CULTURE

## 2014-01-20 ENCOUNTER — Ambulatory Visit: Payer: Self-pay | Admitting: Neurology

## 2014-01-20 ENCOUNTER — Ambulatory Visit: Payer: Medicare HMO

## 2014-01-20 ENCOUNTER — Telehealth: Payer: Self-pay | Admitting: *Deleted

## 2014-01-20 NOTE — Telephone Encounter (Signed)
States she is going home today. She was hospitilized for a severe UTI. Aware unna boot needs changing this week, possible Wednesday or Thursday per husband.

## 2014-01-22 ENCOUNTER — Ambulatory Visit (INDEPENDENT_AMBULATORY_CARE_PROVIDER_SITE_OTHER): Payer: Commercial Managed Care - HMO | Admitting: *Deleted

## 2014-01-22 DIAGNOSIS — I8310 Varicose veins of unspecified lower extremity with inflammation: Secondary | ICD-10-CM | POA: Diagnosis not present

## 2014-01-22 DIAGNOSIS — I872 Venous insufficiency (chronic) (peripheral): Secondary | ICD-10-CM

## 2014-01-22 LAB — CULTURE, BLOOD (SINGLE)

## 2014-01-22 LAB — TSH: TSH: 0.74 u[IU]/mL (ref <=5.90)

## 2014-01-22 NOTE — Patient Instructions (Signed)
One week

## 2014-01-22 NOTE — Progress Notes (Addendum)
The patient came in today for unna boot dressing change.  Left leg were washed with soap and water.  Unna boot, kerlix and coban applied.  Edema improving.The area of concern is improving. Follow up as scheduled.  Unna boots ordered through [facility] as pt is receiving this since discharge from [facility]. Follow up with MD in 3 weeks.

## 2014-02-11 ENCOUNTER — Ambulatory Visit: Payer: Medicare HMO | Admitting: General Surgery

## 2014-03-06 ENCOUNTER — Other Ambulatory Visit: Payer: Self-pay

## 2014-03-06 MED ORDER — PRAMIPEXOLE DIHYDROCHLORIDE 0.75 MG PO TABS: 0.7500 mg | ORAL_TABLET | Freq: Two times a day (BID) | ORAL | Status: DC

## 2014-03-10 ENCOUNTER — Encounter: Payer: Self-pay | Admitting: *Deleted

## 2014-03-16 LAB — BASIC METABOLIC PANEL
BUN: 35 mg/dL — AB (ref 4–21)
Creatinine: 1.4 mg/dL — AB (ref <=1.1)
GLUCOSE: 249 mg/dL
POTASSIUM: 4.6 mmol/L (ref 3.4–5.3)
SODIUM: 143 mmol/L (ref 137–147)

## 2014-03-16 LAB — CBC AND DIFFERENTIAL
HCT: 33 % — AB (ref 36–46)
Hemoglobin: 10.3 g/dL — AB (ref 12.0–16.0)
Platelets: 238 10*3/uL (ref 150–399)
WBC: 7.2 10^3/mL

## 2014-03-16 LAB — HEPATIC FUNCTION PANEL
ALT: 11 U/L (ref 7–35)
AST: 13 U/L (ref 13–35)

## 2014-03-17 ENCOUNTER — Emergency Department: Payer: Self-pay | Admitting: Emergency Medicine

## 2014-03-26 LAB — HM MAMMOGRAPHY: HM Mammogram: NORMAL

## 2014-04-08 ENCOUNTER — Ambulatory Visit: Payer: Self-pay | Admitting: Family Medicine

## 2014-04-15 ENCOUNTER — Ambulatory Visit: Payer: Medicare HMO | Admitting: Neurology

## 2014-05-12 ENCOUNTER — Ambulatory Visit: Payer: Self-pay | Admitting: Family Medicine

## 2014-06-10 ENCOUNTER — Ambulatory Visit: Payer: Self-pay | Admitting: Urology

## 2014-06-17 ENCOUNTER — Emergency Department: Payer: Self-pay | Admitting: Emergency Medicine

## 2014-06-17 LAB — TROPONIN I
Troponin-I: <0.02 ng/mL
Troponin-I: <0.02 ng/mL

## 2014-06-17 LAB — CBC
HCT: 34 % — ABNORMAL LOW (ref 35.0–47.0)
HGB: 10.6 g/dL — AB (ref 12.0–16.0)
MCH: 26 pg (ref 26.0–34.0)
MCHC: 31.2 g/dL — AB (ref 32.0–36.0)
MCV: 84 fL (ref 80–100)
Platelet: 206 10*3/uL (ref 150–440)
RBC: 4.07 10*6/uL (ref 3.80–5.20)
RDW: 16.6 % — ABNORMAL HIGH (ref 11.5–14.5)
WBC: 8.2 10*3/uL (ref 3.6–11.0)

## 2014-06-17 LAB — PRO B NATRIURETIC PEPTIDE: B-Type Natriuretic Peptide: 1150 pg/mL — ABNORMAL HIGH (ref 0–125)

## 2014-06-17 LAB — BASIC METABOLIC PANEL
ANION GAP: 8 (ref 7–16)
BUN: 35 mg/dL — ABNORMAL HIGH (ref 7–18)
CALCIUM: 8.6 mg/dL (ref 8.5–10.1)
CHLORIDE: 105 mmol/L (ref 98–107)
Co2: 27 mmol/L (ref 21–32)
Creatinine: 1.29 mg/dL (ref 0.60–1.30)
EGFR (African American): 55 — ABNORMAL LOW
EGFR (Non-African Amer.): 45 — ABNORMAL LOW
GLUCOSE: 116 mg/dL — AB (ref 65–99)
OSMOLALITY: 288 (ref 275–301)
Potassium: 4.1 mmol/L (ref 3.5–5.1)
Sodium: 140 mmol/L (ref 136–145)

## 2014-06-19 ENCOUNTER — Ambulatory Visit: Payer: Commercial Managed Care - HMO | Admitting: Cardiovascular Disease

## 2014-06-25 ENCOUNTER — Ambulatory Visit (INDEPENDENT_AMBULATORY_CARE_PROVIDER_SITE_OTHER): Payer: Self-pay | Admitting: General Surgery

## 2014-06-25 ENCOUNTER — Encounter: Payer: Self-pay | Admitting: General Surgery

## 2014-06-25 VITALS — BP 132/72 | HR 72 | Resp 16 | Ht 62.0 in | Wt 284.0 lb

## 2014-06-25 DIAGNOSIS — I872 Venous insufficiency (chronic) (peripheral): Secondary | ICD-10-CM

## 2014-06-25 NOTE — Progress Notes (Signed)
Patient ID: Susan House, female   DOB: 1956-01-08, 57 y.o.   MRN: [id number]  Chief Complaint  Patient presents with  . Follow-up    left leg swelling    HPI Susan House is a 58 y.o. female here today for a evaluation of left leg swelling. Patient states this started two days ago. It is not severe, no pain with it.  HPI  Past Medical History  Diagnosis Date  . Cellulitis and abscess of face   . Allergy   . Depression   . Hypertension   . Edema   . Arthritis   . Migraine   . GERD (gastroesophageal reflux disease)   . Stroke   . Diabetes mellitus without complication   . Hyperlipidemia   . Obesity   . IBS (irritable bowel syndrome)   . Urinary incontinence     Past Surgical History  Procedure Laterality Date  . Colonoscopy  2010  . Appendectomy    . Cesarean section    . Hernia repair    . Cholecystectomy    . Eye surgery    . Abdominal hysterectomy    . Upper gi endoscopy    . Carpel tunnel surgery      History reviewed. No pertinent family history.  Social History History  Substance Use Topics  . Smoking status: Never Smoker   . Smokeless tobacco: Never Used  . Alcohol Use: No    Allergies  Allergen Reactions  . Morphine And Related Anaphylaxis  . Betadine [Povidone Iodine] Rash  . Tetracyclines & Related Rash    Current Outpatient Prescriptions  Medication Sig Dispense Refill  . amLODipine (NORVASC) 5 MG tablet Take 5 mg by mouth daily.    Marland Kitchen aspirin 325 MG EC tablet Take 325 mg by mouth daily.    Marland Kitchen atorvastatin (LIPITOR) 20 MG tablet Take 20 mg by mouth at bedtime.    . baclofen (LIORESAL) 20 MG tablet Take 20 mg by mouth 2 (two) times daily.    . citalopram (CELEXA) 40 MG tablet Take 40 mg by mouth daily.    . clopidogrel (PLAVIX) 75 MG tablet Take 75 mg by mouth daily.    . enalapril (VASOTEC) 20 MG tablet Take 20 mg by mouth 2 (two) times daily.    . hydrochlorothiazide (HYDRODIURIL) 25 MG tablet Take 25 mg by mouth daily.    . insulin  NPH-regular (NOVOLIN 70/30) (70-30) 100 UNIT/ML injection Inject 15 Units into the skin 2 (two) times daily.    . metFORMIN (GLUCOPHAGE) 1000 MG tablet Take 1,000 mg by mouth 2 (two) times daily.    Marland Kitchen oxybutynin (DITROPAN-XL) 5 MG 24 hr tablet Take 5 mg by mouth 4 (four) times daily.    . pantoprazole (PROTONIX) 40 MG tablet Take 40 mg by mouth daily.    . pramipexole (MIRAPEX) 0.75 MG tablet Take 1 tablet (0.75 mg total) by mouth 2 (two) times daily. 180 tablet 0  . Prenatal Vit-Fe Sulfate-FA (PRENATAL VITAMIN PO) Take by mouth.     No current facility-administered medications for this visit.    Review of Systems Review of Systems  Constitutional: Negative.   Respiratory: Negative.   Cardiovascular: Negative.     Blood pressure 132/72, pulse 72, resp. rate 16, height 5\' 2"  (1.575 m), weight 284 lb (128.822 kg).  Physical Exam Physical Exam  Constitutional: She is oriented to person, place, and time. She appears well-developed and well-nourished.  Cardiovascular:  Pulses:      Dorsalis  pedis pulses are 0 on the right side, and 0 on the left side.       Posterior tibial pulses are 2+ on the right side, and 2+ on the left side.  Feet are warm, cap refill is 2secs. Mild edema left leg- no calf tenderness.  Neurological: She is alert and oriented to person, place, and time.  Skin: Skin is warm and dry.    Data Reviewed Last note reviewed Assessment    Venous insufficiency-this has been documented before.. No arterial issues at present. Pt advised. Continue use of compression stockings.    Plan    Patient to return  as needed.       Kisean Rollo G 06/25/2014, 11:16 AM

## 2014-07-02 ENCOUNTER — Ambulatory Visit: Payer: Self-pay | Admitting: Urology

## 2014-07-09 ENCOUNTER — Ambulatory Visit: Payer: Commercial Managed Care - HMO | Admitting: Cardiovascular Disease

## 2014-07-28 ENCOUNTER — Ambulatory Visit: Payer: Commercial Managed Care - HMO | Admitting: Cardiovascular Disease

## 2014-08-13 ENCOUNTER — Encounter: Payer: Self-pay | Admitting: Cardiovascular Disease

## 2014-08-13 ENCOUNTER — Ambulatory Visit (INDEPENDENT_AMBULATORY_CARE_PROVIDER_SITE_OTHER): Payer: Commercial Managed Care - HMO | Admitting: Cardiovascular Disease

## 2014-08-13 VITALS — BP 100/52 | HR 68 | Ht 62.0 in | Wt 275.5 lb

## 2014-08-13 DIAGNOSIS — E785 Hyperlipidemia, unspecified: Secondary | ICD-10-CM

## 2014-08-13 DIAGNOSIS — R002 Palpitations: Secondary | ICD-10-CM

## 2014-08-13 DIAGNOSIS — I152 Hypertension secondary to endocrine disorders: Secondary | ICD-10-CM | POA: Insufficient documentation

## 2014-08-13 DIAGNOSIS — I639 Cerebral infarction, unspecified: Secondary | ICD-10-CM

## 2014-08-13 DIAGNOSIS — I10 Essential (primary) hypertension: Secondary | ICD-10-CM

## 2014-08-13 DIAGNOSIS — R0789 Other chest pain: Secondary | ICD-10-CM

## 2014-08-13 DIAGNOSIS — R0602 Shortness of breath: Secondary | ICD-10-CM | POA: Diagnosis not present

## 2014-08-13 DIAGNOSIS — I6529 Occlusion and stenosis of unspecified carotid artery: Secondary | ICD-10-CM | POA: Insufficient documentation

## 2014-08-13 DIAGNOSIS — E1159 Type 2 diabetes mellitus with other circulatory complications: Secondary | ICD-10-CM | POA: Insufficient documentation

## 2014-08-13 DIAGNOSIS — I6523 Occlusion and stenosis of bilateral carotid arteries: Secondary | ICD-10-CM

## 2014-08-13 DIAGNOSIS — R079 Chest pain, unspecified: Secondary | ICD-10-CM

## 2014-08-13 DIAGNOSIS — E118 Type 2 diabetes mellitus with unspecified complications: Secondary | ICD-10-CM

## 2014-08-13 NOTE — Patient Instructions (Signed)
You are doing well.  Please cut the amlodipine in 1/2 once a day (blood pressure was running low today) Or take HCTZ every other day  Please call us if you have new issues that need to be addressed before your next appt.  Your physician wants you to follow-up in: 6 months.  You will receive a reminder letter in the mail two months in advance. If you don't receive a letter, please call our office to schedule the follow-up appointment.

## 2014-08-13 NOTE — Assessment & Plan Note (Signed)
Blood pressure is low on today's visit. She is dizzy getting up to the exam table. Systolic pressure 123XX123. Up to 110 on recheck. Recommended she take her HCTZ every other day. Also could consider cutting amlodipine in half for holding amlodipine. As mouth is very dry, considered she try to cut back on the HCTZ

## 2014-08-13 NOTE — Assessment & Plan Note (Signed)
Cholesterol is at goal on the current lipid regimen. No changes to the medications were made.  

## 2014-08-13 NOTE — Assessment & Plan Note (Signed)
Stroke in 2000, on high-dose aspirin and Plavix. TIA in 2010. Mild to moderate carotid arterial disease in 2008. Possibly embolic. No evidence of arrhythmia

## 2014-08-13 NOTE — Assessment & Plan Note (Signed)
Mild to moderate disease in 2008.  Lipids well controlled. Stressed importance of good diabetes control. We'll set her up for follow-up carotid ultrasound in the future

## 2014-08-13 NOTE — Assessment & Plan Note (Addendum)
She reports hemoglobin A1c 7.2. Stressed the importance of aggressive diabetes control.

## 2014-08-13 NOTE — Progress Notes (Signed)
Patient ID: Susan House, female    DOB: 1955/08/08, 59 y.o.   MRN: [id number]  HPI Comments: 59 year old female with a history of stroke in 2000 followed by posterior circulatory TIA in June of 2010, on Plavix and aspirin, chronic dizziness in the past secondary to dehydration, restless leg syndrome, type 2 diabetes, who presents for recent episodes of chest pain.  She has had significant cardiac workup in the past for chest pain including numerous echocardiograms dating back to 2008, and per the patient she's had a cardiac catheterization. Results of the latter is not available at this time She reports having episode of chest pain early December 2015. She was seen in the emergency room to summer second with normal workup at that time, cardiac enzymes negative, chest x-ray reviewed showing no abnormalities. Normal EKG She returned to the emergency room with hematuria 07/02/2014 from urinary tract infection. These are becoming more common per the patient She denies any further episodes of chest pain through the rest of December into January. Currently feels well  Last echocardiogram July 2015 showing normal ejection fraction She reports having some arrhythmia in early January 2016 in the setting of infection of her toe. Since her toe infection has resolved, she's had no further palpitations. She was treated with Augmentin. No EKG documenting any arrhythmia. She reports her sugars have been relatively well-controlled. She believes her hemoglobin A1c is 7.2  Prior carotid ultrasound 2008 showing mild to moderate carotid arterial disease. Stress test July 2011 showing no ischemia. At that time was having chest pain and had rule out Continues to have chronic dizziness, typically when she stands Mouth is always dry, she attributes this to the oxybutynin  Initial blood pressure today was 100/52, repeat blood pressure 110/70 EKG on today's visit shows normal sinus rhythm with rate 68 bpm, no  significant ST or T-wave changes  Lab work Junior 2015 showing total cholesterol 132, LDL 67 EKG November 2014 showing rare PVC   She denies further stroke or TIA symptoms. She complains of chronic dizziness. She has had admission to the hospital in the past or dizziness which was due to dehydration. Her restless leg syndrome symptoms are well controlled with Mirapex. She denies any compulsive behaviors. She states her blood sugars have been between 130 and 150 fasting. She is on insulin. She returns for reevaluation   Allergies  Allergen Reactions  . Morphine And Related Anaphylaxis  . Betadine [Povidone Iodine] Rash  . Tetracyclines & Related Rash    Outpatient Encounter Prescriptions as of 08/13/2014  Medication Sig  . amLODipine (NORVASC) 5 MG tablet Take 5 mg by mouth daily.  Marland Kitchen aspirin 325 MG EC tablet Take 325 mg by mouth daily.  Marland Kitchen atorvastatin (LIPITOR) 20 MG tablet Take 20 mg by mouth at bedtime.  . baclofen (LIORESAL) 20 MG tablet Take 20 mg by mouth 2 (two) times daily.  . citalopram (CELEXA) 40 MG tablet Take 40 mg by mouth daily.  . clopidogrel (PLAVIX) 75 MG tablet Take 75 mg by mouth daily.  . enalapril (VASOTEC) 20 MG tablet Take 20 mg by mouth 2 (two) times daily.  . hydrochlorothiazide (HYDRODIURIL) 25 MG tablet Take 25 mg by mouth daily.  . insulin NPH-regular (NOVOLIN 70/30) (70-30) 100 UNIT/ML injection Inject 15 Units into the skin 2 (two) times daily.  . metFORMIN (GLUCOPHAGE) 1000 MG tablet Take 1,000 mg by mouth 2 (two) times daily.  Marland Kitchen oxybutynin (DITROPAN-XL) 5 MG 24 hr tablet Take 5 mg by  mouth 4 (four) times daily.  . pantoprazole (PROTONIX) 40 MG tablet Take 40 mg by mouth daily.  . pramipexole (MIRAPEX) 0.75 MG tablet Take 1 tablet (0.75 mg total) by mouth 2 (two) times daily.  . Prenatal Vit-Fe Sulfate-FA (PRENATAL VITAMIN PO) Take by mouth.    Past Medical History  Diagnosis Date  . Cellulitis and abscess of face   . Allergy   . Depression   .  Hypertension   . Edema   . Arthritis   . Migraine   . GERD (gastroesophageal reflux disease)   . Stroke   . Diabetes mellitus without complication   . Hyperlipidemia   . Obesity   . IBS (irritable bowel syndrome)   . Urinary incontinence     Past Surgical History  Procedure Laterality Date  . Colonoscopy  2010  . Appendectomy    . Cesarean section    . Hernia repair    . Cholecystectomy    . Eye surgery    . Abdominal hysterectomy    . Upper gi endoscopy    . Carpel tunnel surgery      Social History  reports that she has never smoked. She has never used smokeless tobacco. She reports that she does not drink alcohol or use illicit drugs.  Family History family history includes Arrhythmia in her mother; Heart attack (age of onset: 54) in her father; Hyperlipidemia in her father and mother; Hypertension in her father and mother.  Review of Systems  Constitutional: Negative.   Eyes: Negative.   Respiratory: Negative.   Cardiovascular: Negative.   Gastrointestinal: Negative.   Musculoskeletal: Negative.   Neurological: Negative.   Hematological: Negative.   Psychiatric/Behavioral: Negative.   All other systems reviewed and are negative.   BP 100/52 mmHg  Pulse 68  Ht 5\' 2"  (1.575 m)  Wt 275 lb 8 oz (124.966 kg)  BMI 50.38 kg/m2  Physical Exam  Constitutional: She is oriented to person, place, and time. She appears well-developed and well-nourished.  Obese  HENT:  Head: Normocephalic.  Nose: Nose normal.  Mouth/Throat: Oropharynx is clear and moist.  Eyes: Conjunctivae are normal. Pupils are equal, round, and reactive to light.  Neck: Normal range of motion. Neck supple. No JVD present.  Cardiovascular: Normal rate, regular rhythm, S1 normal, S2 normal, normal heart sounds and intact distal pulses.  Exam reveals no gallop and no friction rub.   No murmur heard. Trace edema of the left lower extremity below the knee  Pulmonary/Chest: Effort normal and breath  sounds normal. No respiratory distress. She has no wheezes. She has no rales. She exhibits no tenderness.  Abdominal: Soft. Bowel sounds are normal. She exhibits no distension. There is no tenderness.  Musculoskeletal: Normal range of motion. She exhibits no edema or tenderness.  Lymphadenopathy:    She has no cervical adenopathy.  Neurological: She is alert and oriented to person, place, and time. Coordination normal.  Left side weakness arm and leg  Skin: Skin is warm and dry. No rash noted. No erythema.  Psychiatric: She has a normal mood and affect. Her behavior is normal. Judgment and thought content normal.    Assessment and Plan  Nursing note and vitals reviewed.

## 2014-08-13 NOTE — Assessment & Plan Note (Signed)
Atypical chest pain in early December 2015. No further episodes since that time. No prior cardiac issues, numerous echocardiogram showing normal ejection fraction. As she's not having any recurrent symptoms, stress test has not been ordered. Suggested to her that if she has recurrent chest pain symptoms that she call our office for further evaluation, possible stress testing

## 2014-08-14 IMAGING — US US EXTREM LOW VENOUS*R*
1 series · 14 of 21 positions shown · non-contrast
Comparison: none

REASON FOR EXAM: Call Report  [id number]  after 5pm [id number]  Right leg
swelling and redness  Mennea...
COMMENTS:

[Series 1: us extrem low venous*right* · 0.12mm/px · 14 of 21 slices shown]
[im 1/21]
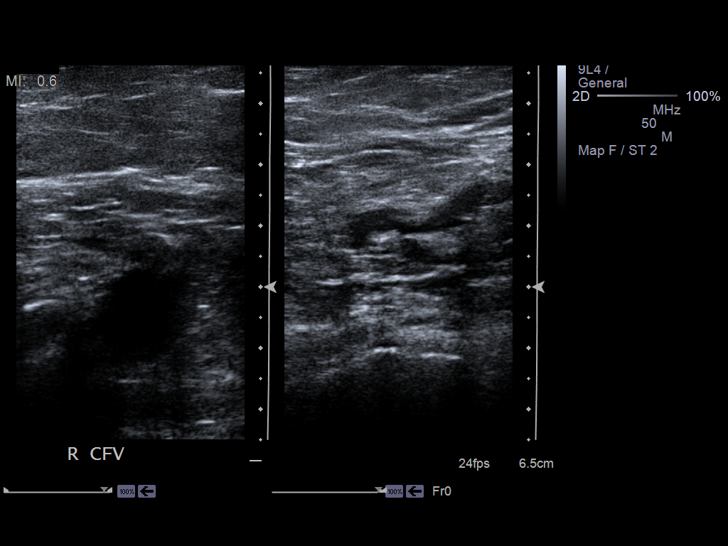
[im 3/21]
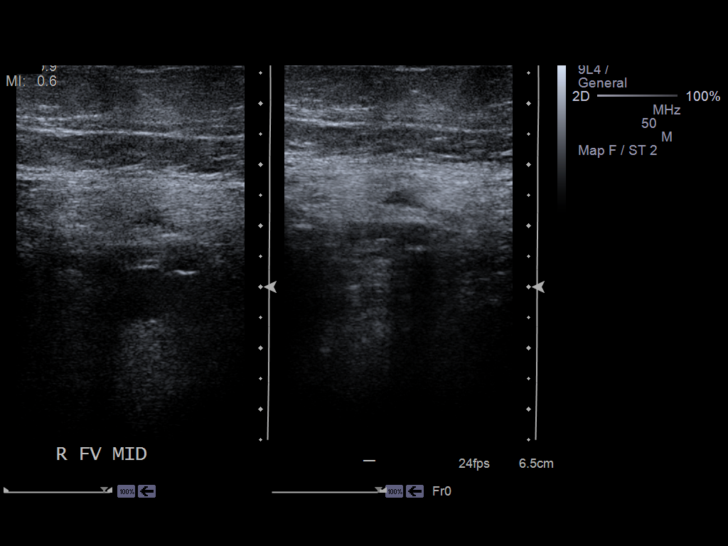
[im 4/21]
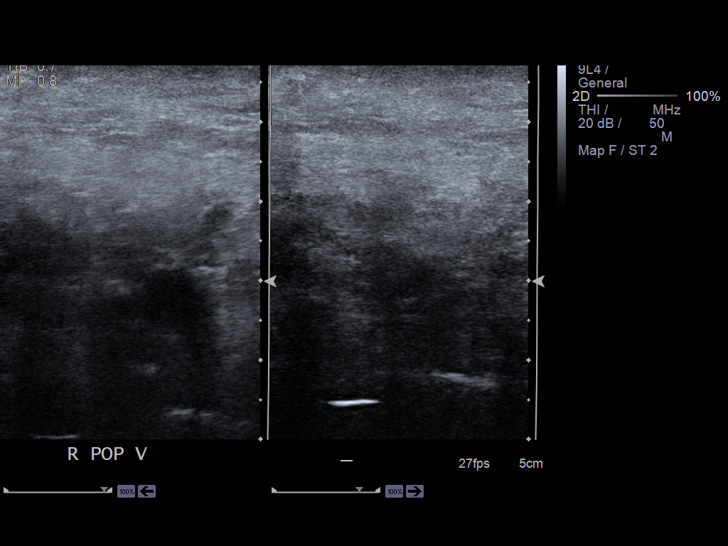
[im 6/21]
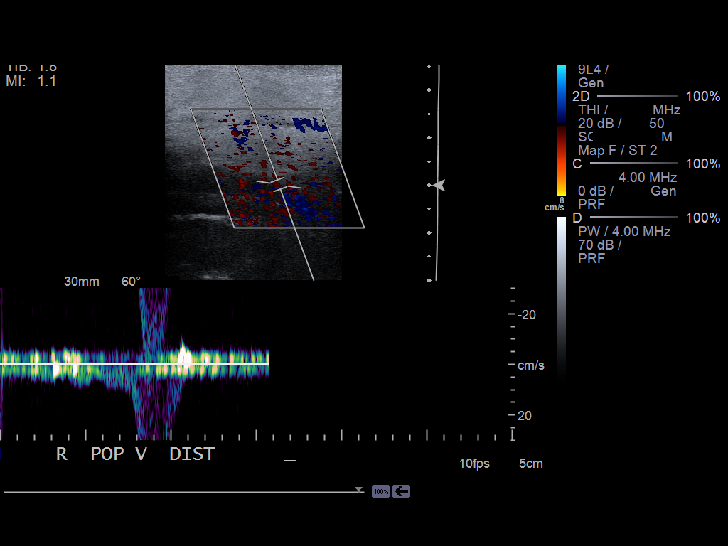
[im 7/21]
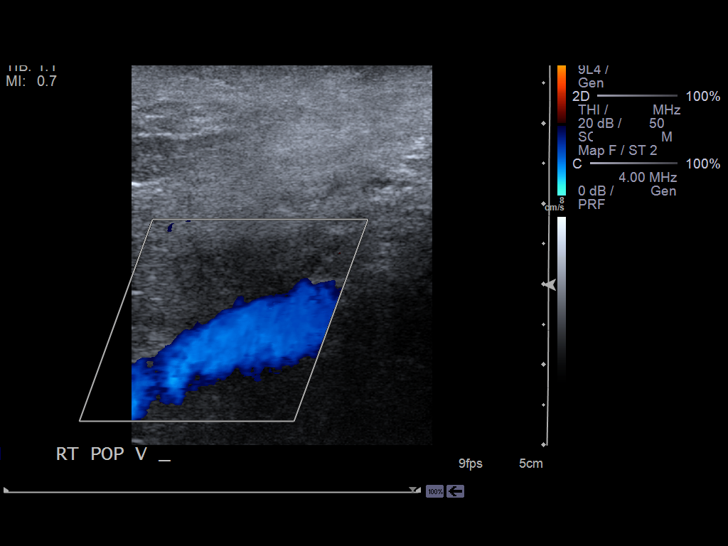
[im 9/21]
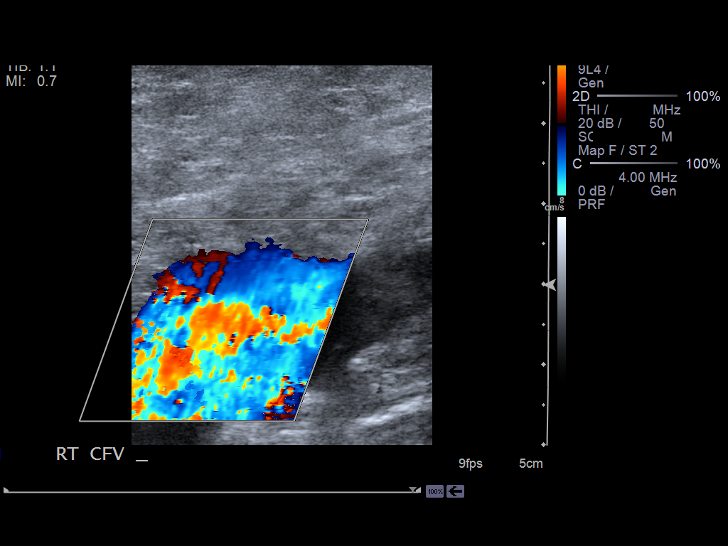
[im 10/21]
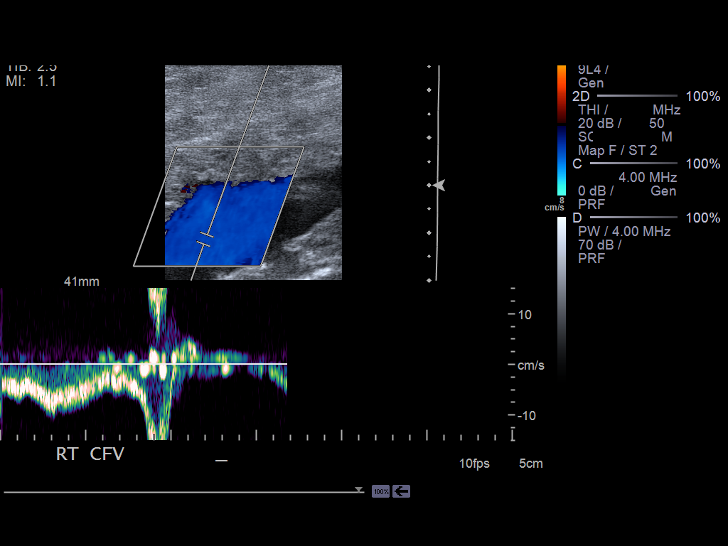
[im 12/21]
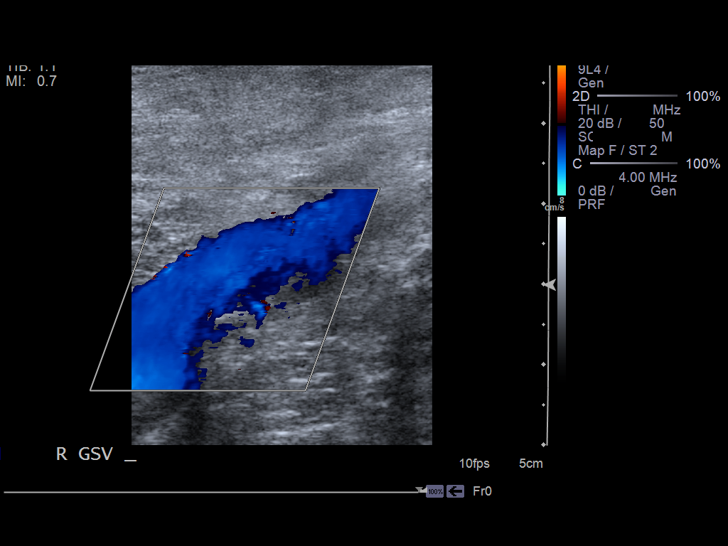
[im 13/21]
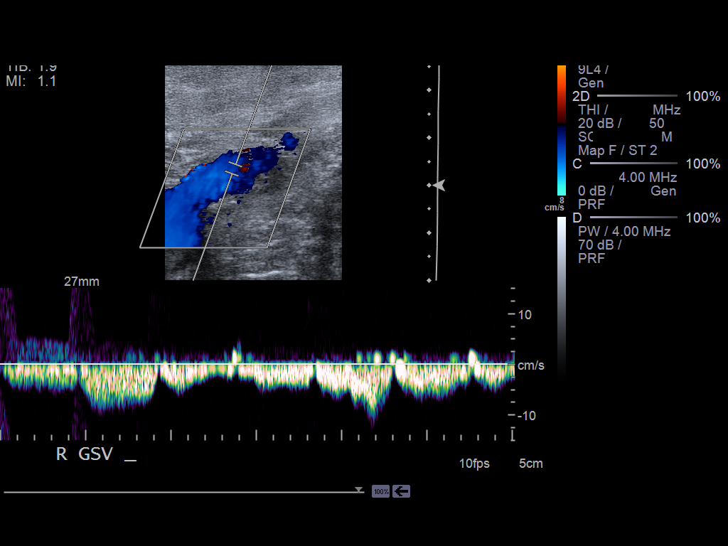
[im 15/21]
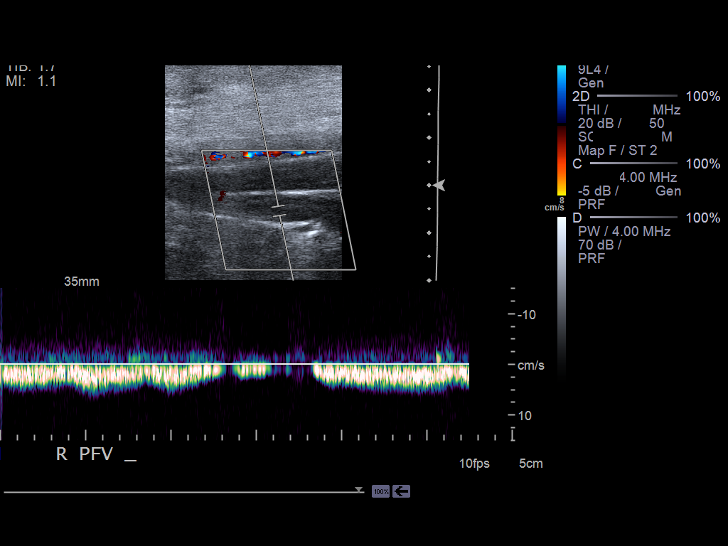
[im 16/21]
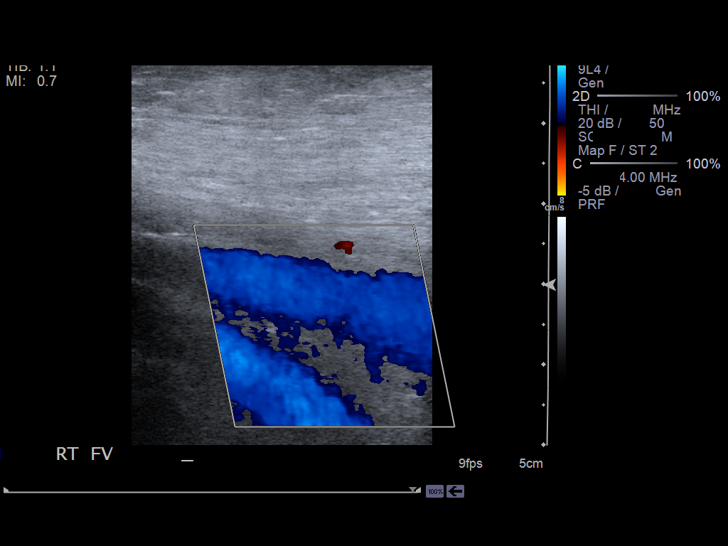
[im 18/21]
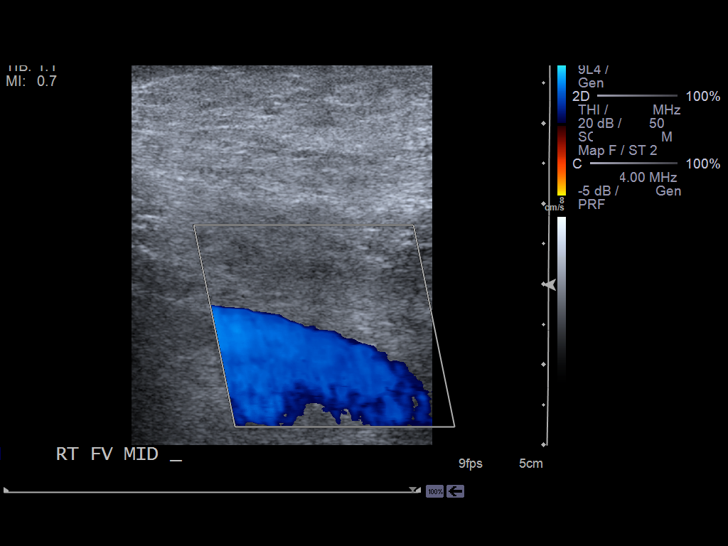
[im 19/21]
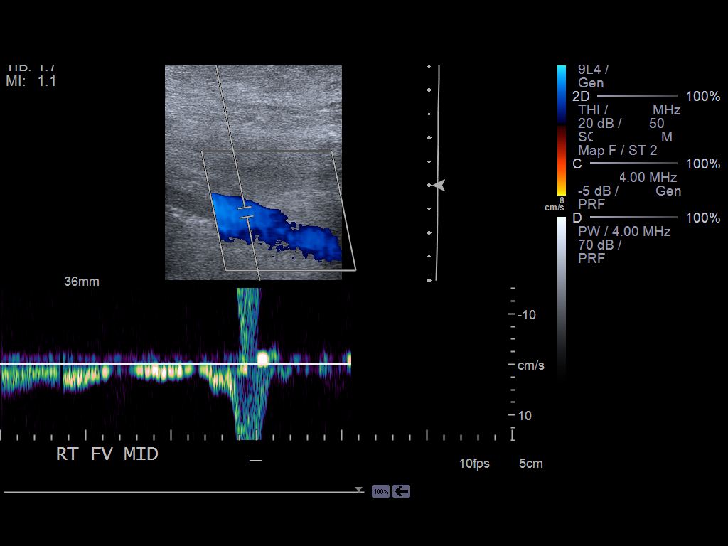
[im 21/21]
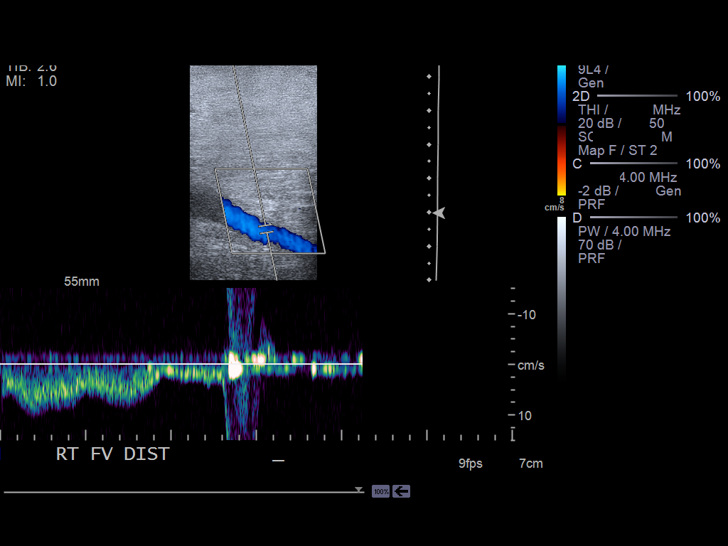

[14 of 21 positions shown; findings below may reference images not displayed]

PROCEDURE:     US  - US DOPPLER LOW EXTR RIGHT  - October 15, 2012  [DATE]

RESULT:     Doppler interrogation of the deep venous system of the right leg
is performed from the common femoral vein through the popliteal vein. Images
demonstrate grayscale compression images suggest full compressibility of the
deep venous structures. Color Doppler and spectral Doppler interrogation is
normal. There is no evidence of abnormal fluid collection.
IMPRESSION: 1. No evidence of right lower extremity deep vein thrombosis.

[REDACTED]

## 2014-08-18 IMAGING — CT CT HEAD WITHOUT CONTRAST
1 of 2 series · 13 of 30 positions shown, 17 images · non-contrast
Comparison: none

REASON FOR EXAM: Double vision, facial droop
COMMENTS:

[Series 5: soft tissue · axial · 0.42mm/px · z∈[-296,-156]mm · 13 of 34 slices shown, 17 images]
[im 3/34  brain]
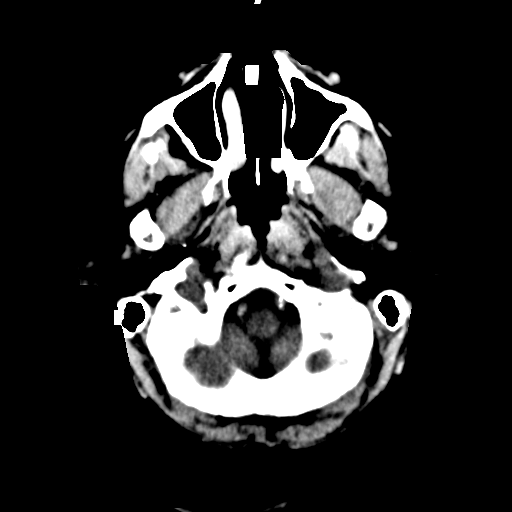
[im 3/34  bone]
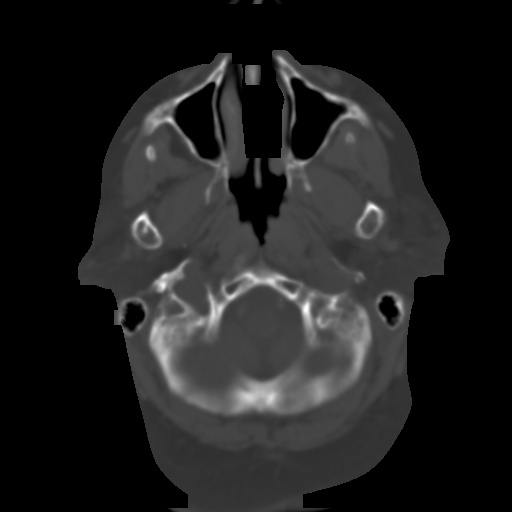
[im 5/34  brain]
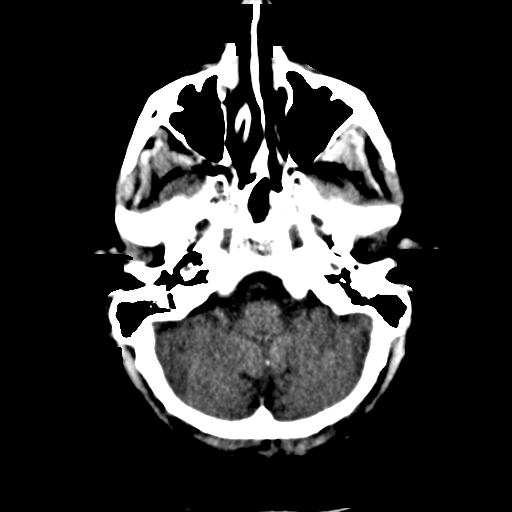
[im 8/34  brain]
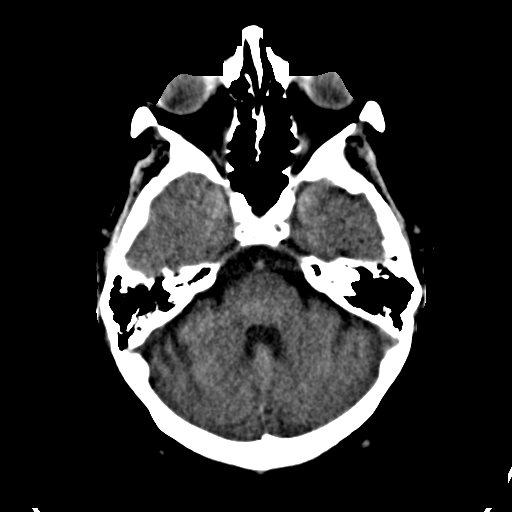
[im 10/34  brain]
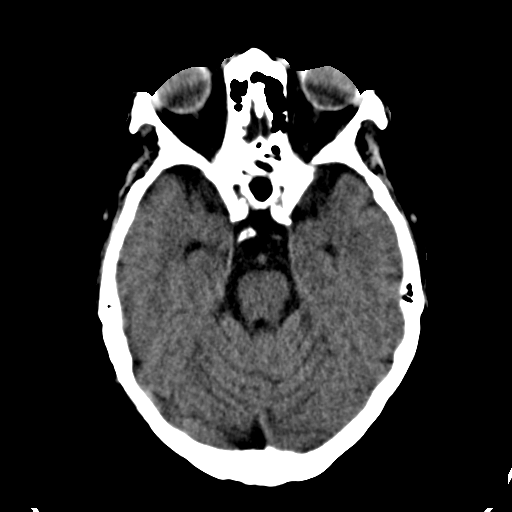
[im 12/34  brain]
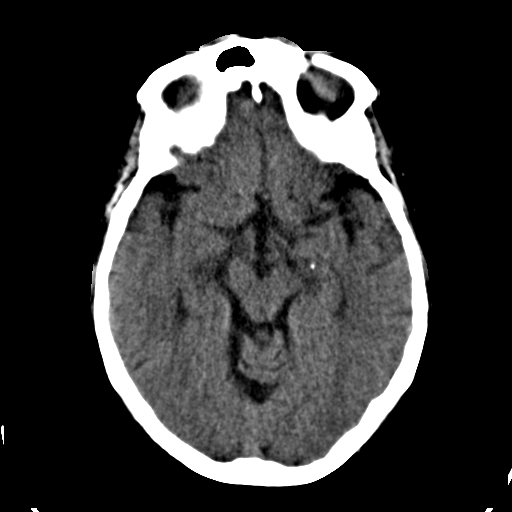
[im 12/34  bone]
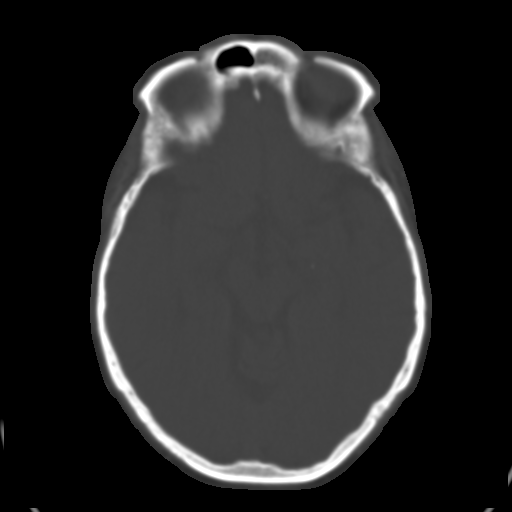
[im 15/34  brain]
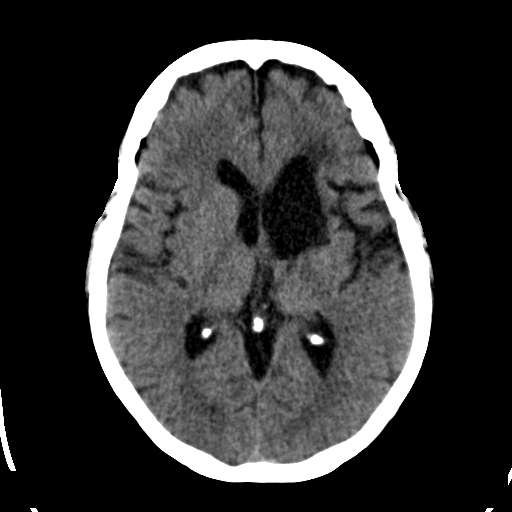
[im 17/34  brain]
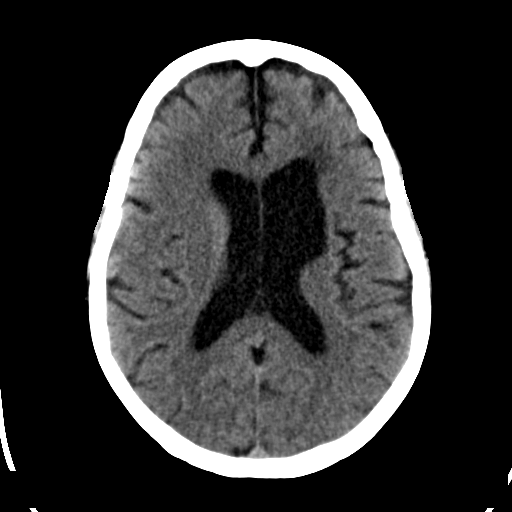
[im 19/34  brain]
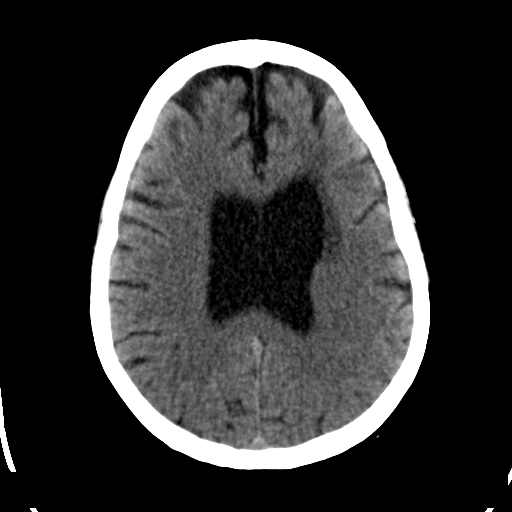
[im 22/34  brain]
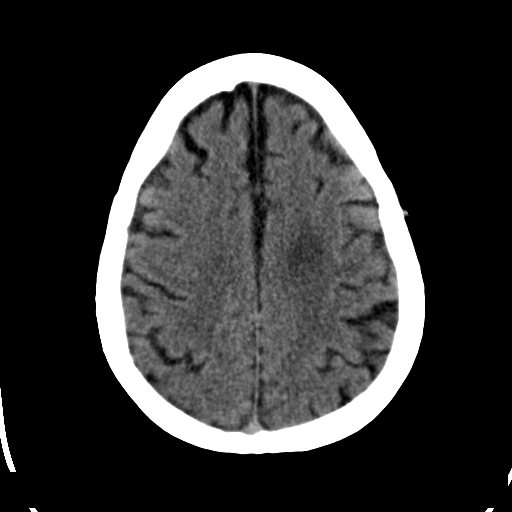
[im 22/34  bone]
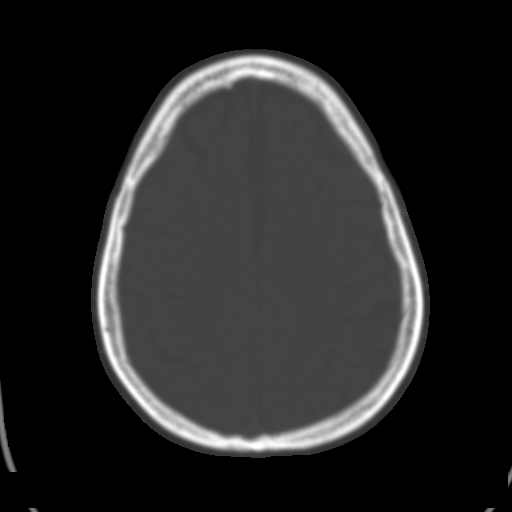
[im 24/34  brain]
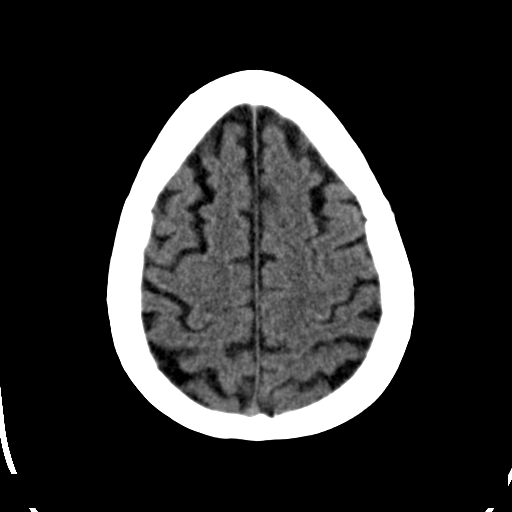
[im 26/34  brain]
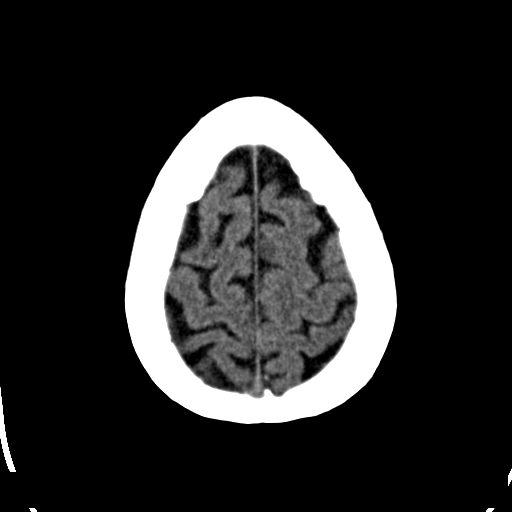
[im 29/34  brain]
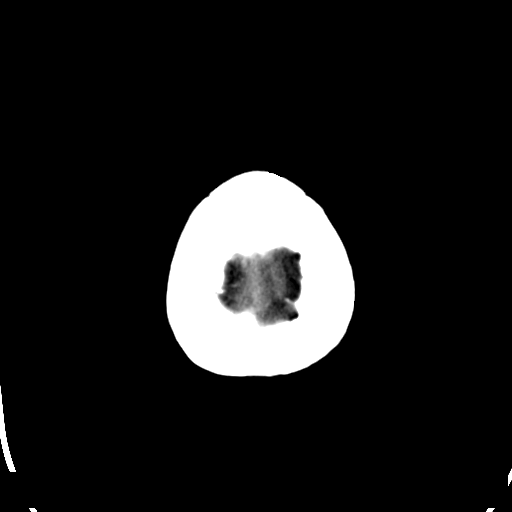
[im 31/34  brain]
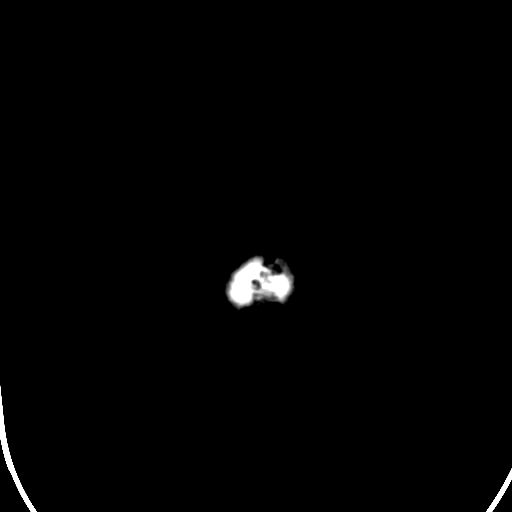
[im 31/34  bone]
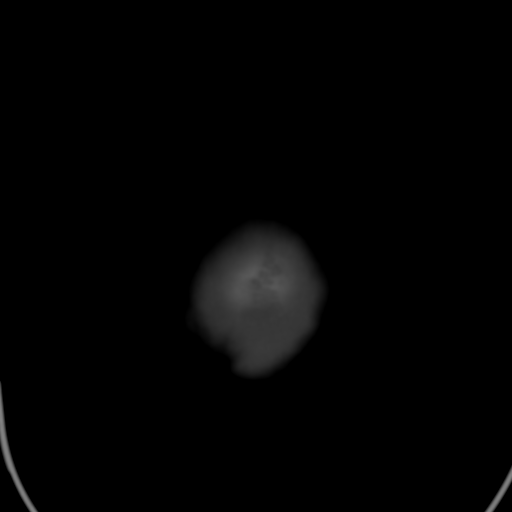

[13 of 30 positions shown; findings below may reference images not displayed]

PROCEDURE:     CT  - CT HEAD WITHOUT CONTRAST  - October 19, 2012 [DATE]

RESULT:     Axial noncontrast CT scanning was performed through the brain
with reconstructions at 5 mm intervals and slice thicknesses. There are no
previous studies available for comparison.

There is ex vacuo dilation of the left lateral ventricle in its anterior and
midportion. There is evidence of encephalomalacia in the basal ganglia on
the left. There is mildly decreased density in the deep white matter of the
left cerebellar hemisphere in the frontal region. There is no shift of the
midline. There is no acute intracranial hemorrhage. The cerebellum and
brainstem are normal in density. There are no abnormal intracranial
calcifications.

At bone window settings there is no evidence of an acute skull fracture. The
observed portions of the paranasal sinuses are clear.
IMPRESSION: There is no acute intracranial abnormality. There are
chronic changes in the left frontal lobe and left basal ganglia consistent
with a pre with previous ischemic insults resulting in encephalomalacia and
ex vacuo dilation of the left lateral ventricle.

[REDACTED]

## 2014-11-07 NOTE — H&P (Signed)
PATIENT NAME:  Susan House, Susan House MR#:  [id number] DATE OF BIRTH:  August 14, 1955  DATE OF ADMISSION:  01/17/2014  PRIMARY CARE PHYSICIAN:  Dr. Rosanna Randy.   CARDIOLOGIST:  Dr. Clayborn Bigness.   HISTORY OF PRESENT ILLNESS:  The patient is a 59 year old Caucasian female with past medical history significant for history of multiple medical problems including admission in July 2011 for chest pains which was felt to be musculoskeletal, history of bradycardia, renal failure, hypertension, stroke with left-sided weakness, history of urinary tract infection, hyperlipidemia, diabetes mellitus, who presents to the hospital with complaints of confusion.  Apparently the patient was doing well yesterday; however, today in the morning she was found to be confused by family and so they brought her in to the hospital via EMS.  She was somewhat sleepy earlier today.  She was complaining of lower back pains; however, lower back pain seems to be chronic.  In the Emergency Room, she was noted to have renal insufficiency as well as some dehydration and hospice services were contacted for admission.  She was also noted to have urinary tract infection.   PAST MEDICAL HISTORY:  Significant for history of admission for musculoskeletal chest pains in July 2011, bradycardia, acute renal failure resolved with IV fluids, hypertension, stroke, history of urinary tract infections in the past, hyperlipidemia, obesity, diabetes mellitus insulin-dependent, transient ischemic attack in 2007, history of migraine headaches, spinal fusion surgery in the past, overactive bladder, anemia, history of vitreous hemorrhage and decreased peripheral vision in the eyes status post repair and vision is back to normal.   PAST SURGICAL HISTORY:  Also significant for cataract surgery, toe surgery, hysterectomy, cholecystectomy, appendectomy, Cesarean section as well as back surgeries.   ALLERGIES:  MORPHINE, REGLAN, DILAUDID, TETRACYCLINE AS WELL AS BETADINE.    MEDICATIONS:  According to medical records, the patient is on acetaminophen hydrocodone 325/5 mg 1 tablet 3 times daily as needed, Aggrenox 1 tablet twice daily, amlodipine 5 mg by mouth daily, aspirin 325 mg by mouth daily, atorvastatin 10 mg by mouth at bedtime, baclofen 10 mg 4 times daily, bupropion SR 150 mg by mouth twice daily, carvedilol 6.25 mg by mouth twice daily, ciprofloxacin 500 mg by mouth twice daily, citalopram 40 mg by mouth daily, Citracal with vitamin D once daily, clopidogrel 75 mg by mouth daily, cranberry juice capsule once daily, diazepam 5 mg 4 times daily, enalapril 20 mg by mouth twice daily, folic acid 0.8 mg by mouth once daily, HCTZ 25 mg by mouth daily, insulin 70/30 26 units in the morning and 17 units in the p.m., magnesium oxide 400 mg by mouth daily, magnesium oxide 600 mg by mouth daily, metformin 1 gram twice daily, Novolin, omeprazole 20 mg by mouth daily, multivitamins, oxybutynin 5 mg by mouth twice daily, oxycodone 5 mg 2 capsules every four hours as needed for pain, pramipexole 0.75 mg by mouth twice daily, Pravachol 40 mg by mouth daily.  It is unclear which medications this patient is getting since reconciliation is not yet done.   FAMILY HISTORY:  Hypertension as well as heart disease as well as diabetes.  The patient's father died of heart disease at the age of 31.  No history of cancers.   SOCIAL HISTORY:  The patient is married, lives with her husband.  No history of smoking, alcohol abuse.  She is disabled, not working after stroke.   REVIEW OF SYSTEMS:   Difficult to obtain as the patient is confused.  Admits of having some lower back pains  as well as incontinent, according to the patient's family she uses [facility], however she does have significant incontinence intermittently.  She has also left lower extremity sore which is being treated.  She has lower extremity swelling which seems to be chronic and being managed with Kerlix.  She walks with cane as  well as walker according to family members.   CONSTITUTIONAL:  Denies any fevers, chills, fatigue, weakness, pains, weight loss or gain.  EYES:  Denies any blurry vision, double vision, glaucoma or cataracts.  EARS, NOSE, THROAT:  Denies any tinnitus, allergies, epistaxis, sinus pain, dentures, difficulty swallowing.   RESPIRATORY:  Denies cough, wheeze, asthma, COPD.  CARDIOVASCULAR:  Denies chest pains, orthopnea, edema, arrhythmias, palpitations or syncope.  GASTROINTESTINAL:  Denies nausea, vomiting, diarrhea or constipation.  GENITOURINARY:  Denies dysuria, hematuria, frequency.  ENDOCRINOLOGY:  Denies any polydipsia, nocturia or thyroid problems, heat or cold intolerance or thirst.  HEMATOLOGY AND LYMPHATIC:  Denies anemia, easy bruising, bleeding, swollen glands.  SKIN:  Denies any acne, lesion, moles.  MUSCULOSKELETAL:  Denies arthritis, cramps, swelling.  NEUROLOGIC:  Denies numbness, epilepsy or tremor.  PSYCHIATRIC:  Denies anxiety, insomnia, depression.   PHYSICAL EXAMINATION: VITAL SIGNS:  On arrival to the hospital, temperature was 97.6, pulse was 65, respiratory rate was 18, blood pressure 143/85, saturation was 100% on room air.  GENERAL:  This is a well-developed, well-nourished, obese Caucasian female in no significant distress, sitting on the stretcher.  HEENT:  Her pupils equal, reactive to light.  Extraocular muscles intact, no icterus or conjunctivitis.  Has normal hearing.  No pharyngeal erythema.  Mucosa is very dry.  NECK:  No masses.  Supple, nontender.  Thyroid is not enlarged.  No adenopathy.  No JVD or carotid bruits bilaterally.  Full range of motion.  LUNGS:  Clear to auscultation in all fields.  No rales, rhonchi, diminished breath sounds or wheezing.  No labored inspirations, increased effort, dullness to percussion or overt respiratory distress.  CARDIOVASCULAR:  S1, S2 appreciated.  The rhythm is regular.  PMI not lateralized.  Chest is nontender to palpation.   1+ pedal pulses.  Trace lower extremity edema.  The patient's left lower extremity is in Kerlix wrapped.  No calf tenderness or cyanosis was noted.  ABDOMEN:  Soft, protuberant, nontender.  Bowel sounds are present.  No hepatosplenomegaly or masses were noted.  RECTAL:  Deferred.  MUSCLE STRENGTH:  Able to move the right side, however has difficulty with left side, however her effort is not adequate.  She does have significant contracture in the left upper extremity.  No cyanosis, degenerative joint disease.  SKIN:  Did not reveal any rashes, lesions, erythema, nodularity or induration.  She does have an ulceration apparently in the left lower extremity which was unfortunately not unwrapped.  LYMPHATIC:  No adenopathy in the cervical region.  NEUROLOGICAL:  Cranial nerves grossly intact.  Sensory is intact.  No dysarthria, aphasia.  The patient is alert, disoriented, cooperative, but memory is impaired.  She is confused, but no agitation or depression noted.   LABORATORY DATA:  BMP done on arrival to the hospital 01/17/2014 revealed elevation of BUN and creatinine to 31 and 1.39.  Glucose level is elevated at 247 and apparently at home it was 400.  Liver enzymes revealed alkaline phosphatase elevation to 120, otherwise liver enzymes normal.  Troponin less than 0.02.  White blood cell count was normal at 8.3, hemoglobin 11.2, platelet count 215.  Urinalysis, yellow hazy urine, more than 500 glucose,  negative for bilirubin or ketones.  Specific gravity 1.014, pH was 5.0, negative for blood, 30 mg/dL of protein, negative for nitrites, 2+ leukocyte esterase, 0 to 5 red blood cells, 15 to 30 white blood cells, 1+ bacteria, 0 to 5 epithelial cell as well as transitional epithelial cell.  White blood cell clumps were present.   RADIOLOGIC STUDIES:  Chest x-ray, portable single view, 01/17/2014, showed mild bibasilar atelectasis, PA and lateral chest x-ray would be beneficial when clinically possible.  CT scan of  head without contrast 01/17/2014 showed no acute intracranial abnormality, stable chronic changes in left frontal lobe and basal ganglia were demonstrated.   ASSESSMENT AND PLAN: 1.  Metabolic encephalopathy due to urinary tract infection as well as dehydration.  Admit the patient to the medical floor.  Continue intravenous fluids.  Neuro checks.  CT scan of head was unremarkable.  We will continue the patient with physical therapy.  2.  Urinary tract infection.  We will continue Rocephin and get cultures.  3.  Renal insufficiency, acute.  Continue intravenous fluids.  Follow up the patient's creatinine.  4.  Dehydration, questionable due to poorly controlled diabetes mellitus.  Get hemoglobin A1c.  We will continue intravenous fluids.  5.  Anemia, seems to be stable.  Follow up with rehydration.   TIME SPENT:  50 minutes.    ____________________________ Theodoro Grist, MD rv:ea D: 01/17/2014 19:41:53 ET T: 01/17/2014 23:00:40 ET JOB#: [id number]  cc: Theodoro Grist, MD, <Dictator> Richard L. Rosanna Randy, MD Theodoro Grist MD ELECTRONICALLY SIGNED 02/05/2014 16:20

## 2014-11-07 NOTE — Consult Note (Signed)
PATIENT NAME:  Susan House, Susan House MR#:  [id number] DATE OF BIRTH:  11-11-1955  DATE OF CONSULTATION:  01/20/2014  REFERRING PHYSICIAN:   CONSULTING PHYSICIAN:  Leotis Pain, MD  REASON FOR CONSULTATION: Altered mental status.   HISTORY OF PRESENT ILLNESS:  A pleasant female presenting July 4th with altered mental status. She is a 59 year old Caucasian female with past medical history significant for admission 2011 for chest pain, renal failure, hypertension, history of a stroke with residual left-sided weakness, history of urinary tract infections, and diabetes presenting with 2-3 days history of confusion, per family.  Hospital course complicated by findings of a urinary tract infection,  periods of unresponsiveness and confusion. The patient is status post MRI of the head, did not show any acute intracranial abnormality. As per family, the patient is currently back to baseline. All laboratory work-up and imaging has been reviewed.   ALLERGIES: INCLUDE MORPHINE, REGLAN AND DILAUDID.   PAST SURGICAL HISTORY: Bilateral cataract surgery, status post hysterectomy, cholecystectomy, appendectomy and cesarean section.   PAST MEDICAL HISTORY: Multiple admissions for chest pain, bradycardia, renal failure, urinary tract infections, stroke, and cataracts.  FAMILY HISTORY: Hypertension and diabetes.   SOCIAL HISTORY: Lives with her husband. No smoking, drug or EtOH use.   CURRENT REVIEW OF SYSTEMS: No shortness of breath. No abdominal pain. No chest pain and chronic weakness on the left side from previous stroke. No anxiety. No depression. No heat or cold intolerance.   PHYSICAL EXAMINATION:  VITAL SIGNS: Include a temperature of 97.8, pulse 61, respirations 20, blood pressure 148/66, pulse oximetry 96.  NEUROLOGICAL EVALUATION: The patient is alert, awake and oriented to time, place, and location.  Able to count the days of the week backwards. Facial sensation intact. Facial motor is intact. There is  prominent anisocoria that is present. Right eye is 4 mm, left eye is 2 mm.  Both are reactive and responsive. No proptosis.  No anhidrosis.  I do not think this is Horner's. Tongue is midline. Motor strength appears to be diminished in the left upper extremity with chronic contractures, chronic, left upper extremity proximal is 4/5, distally 3/5.  Left lower extremity is 3/5. There is a left facial droop, which is chronic.  Reflexes: Significant reflexes on the left upper extremity. Coordination: Finger-to-nose intact on the right. Gait could not be assessed.   IMPRESSION: A 59 year old female admitted with altered mental status, found to present with urinary tract infection.  Mental status back to baseline.   PLAN: Agree with discharge planning. No further imaging from a neurological prospective. Continue the antiplatelet medication. Follow up with primary urology the patient sees in [place]. This case was discussed with Dr. Bridgett Larsson.   Thank you. It was a pleasure seeing this patient.    ____________________________ Leotis Pain, MD yz:ts D: 01/20/2014 13:28:59 ET T: 01/20/2014 13:57:20 ET JOB#: [id number]  cc: Leotis Pain, MD, <Dictator> Leotis Pain MD ELECTRONICALLY SIGNED 01/27/2014 14:55

## 2014-11-07 NOTE — Discharge Summary (Signed)
PATIENT NAME:  Susan House, Susan House MR#:  [id number] DATE OF BIRTH:  04-08-1956  DATE OF ADMISSION:  01/17/2014 DATE OF DISCHARGE:  01/20/2014  PRIMARY CARE PHYSICIAN: Dr. Miguel Aschoff  DISCHARGE DIAGNOSES: 1.  Acute encephalopathy, possibly due to urinary tract infection.  2.  Staphylococcus urinary tract infection.  3.  Acute renal failure. 4.  Hypertension. 5.  Diabetes. 6.  History of cerebrovascular accident.   CONDITION: Stable.   CODE STATUS: FULL code.   HOME MEDICATIONS: Please refer to the medication reconciliation list.   DISCHARGE DIET: Low-sodium, low-fat, low-cholesterol, ADA diet.  DISCHARGE ACTIVITY: As tolerated.   FOLLOW-UP CARE: Follow up with PCP within 1 to 2 weeks.   HOME CARE: The patient needs home health and physical therapy.   REASON FOR ADMISSION: Confusion.   HOSPITAL COURSE: The patient is a 59 year old female with a history of hypertension, diabetes, and CVA with left-sided weakness who was sent to ED due to confusion. She was noted to have renal insufficiency and dehydration. In addition, urinalysis showed UTI. For detailed history and physical examination, please refer to the admission note dictated by Dr. Ether Griffins.   DIAGNOSTIC DATA: Laboratory data on admission date showed BUN 31 and creatinine 1.39. WBC 8.3 and hemoglobin 11.2.   Chest x-ray showed mild bibasilar atelectasis. CAT scan of head did not show any acute intracranial abnormality.   HOSPITAL COURSE: 1.  Acute metabolic encephalopathy, possibly due to UTI. The patient's mental status has been improving. The patient becomes alert, awake, and oriented today. She got a MRI of brain, which did not show any acute CVA. Dr. Irish Elders evaluated the patient and suggested the patient may be discharged to home today.  2.  For UTI, the patient was treated with Rocephin. Since urine culture showed Staphylococcus aureus, the patient's antibiotic was changed to Bactrim p.o.  3.  Acute renal failure.  After IV fluid support, renal function improved.  4.  History of CVA. The patient has been treated with statin and aspirin and the patient got physical therapy evaluation.   The patient's mental status has much improved. Vital signs are stable. She is clinically stable and will be discharged to home with home health and PT today. I discussed the patient's discharge plan with the patient, the patient's husband, nurse and case manager.   TIME SPENT: About 40 minutes.   ____________________________ Demetrios Loll, MD qc:sb D: 01/20/2014 14:20:17 ET T: 01/20/2014 16:25:52 ET JOB#: [id number]  cc: Demetrios Loll, MD, <Dictator> Demetrios Loll MD ELECTRONICALLY SIGNED 01/20/2014 18:04

## 2014-11-10 ENCOUNTER — Ambulatory Visit
Admit: 2014-11-10 | Disposition: A | Discharge status: Home / Self Care | Payer: Self-pay | Attending: Family Medicine | Admitting: Family Medicine

## 2014-11-11 DIAGNOSIS — E1149 Type 2 diabetes mellitus with other diabetic neurological complication: Secondary | ICD-10-CM | POA: Insufficient documentation

## 2014-11-11 DIAGNOSIS — S4292XA Fracture of left shoulder girdle, part unspecified, initial encounter for closed fracture: Secondary | ICD-10-CM | POA: Insufficient documentation

## 2014-11-11 DIAGNOSIS — F330 Major depressive disorder, recurrent, mild: Secondary | ICD-10-CM

## 2014-11-11 DIAGNOSIS — I739 Peripheral vascular disease, unspecified: Secondary | ICD-10-CM

## 2014-11-11 DIAGNOSIS — F419 Anxiety disorder, unspecified: Secondary | ICD-10-CM

## 2014-11-11 DIAGNOSIS — R252 Cramp and spasm: Secondary | ICD-10-CM

## 2014-11-11 DIAGNOSIS — F32A Depression, unspecified: Secondary | ICD-10-CM

## 2014-11-11 DIAGNOSIS — E039 Hypothyroidism, unspecified: Secondary | ICD-10-CM | POA: Insufficient documentation

## 2014-11-11 DIAGNOSIS — F320 Major depressive disorder, single episode, mild: Secondary | ICD-10-CM | POA: Insufficient documentation

## 2014-11-11 DIAGNOSIS — Z8614 Personal history of Methicillin resistant Staphylococcus aureus infection: Secondary | ICD-10-CM

## 2014-11-11 DIAGNOSIS — I998 Other disorder of circulatory system: Secondary | ICD-10-CM

## 2014-11-11 DIAGNOSIS — H269 Unspecified cataract: Secondary | ICD-10-CM

## 2014-11-11 DIAGNOSIS — N390 Urinary tract infection, site not specified: Secondary | ICD-10-CM

## 2014-11-11 DIAGNOSIS — E785 Hyperlipidemia, unspecified: Secondary | ICD-10-CM | POA: Insufficient documentation

## 2014-11-11 DIAGNOSIS — S82892S Other fracture of left lower leg, sequela: Secondary | ICD-10-CM

## 2014-11-11 DIAGNOSIS — G2581 Restless legs syndrome: Secondary | ICD-10-CM

## 2014-11-11 DIAGNOSIS — T148XXA Other injury of unspecified body region, initial encounter: Secondary | ICD-10-CM

## 2014-11-11 DIAGNOSIS — F329 Major depressive disorder, single episode, unspecified: Secondary | ICD-10-CM | POA: Insufficient documentation

## 2014-11-11 DIAGNOSIS — E669 Obesity, unspecified: Secondary | ICD-10-CM

## 2014-11-11 DIAGNOSIS — Z7409 Other reduced mobility: Secondary | ICD-10-CM

## 2014-11-11 DIAGNOSIS — L300 Nummular dermatitis: Secondary | ICD-10-CM

## 2014-11-11 DIAGNOSIS — T300 Burn of unspecified body region, unspecified degree: Secondary | ICD-10-CM

## 2014-11-11 DIAGNOSIS — I69359 Hemiplegia and hemiparesis following cerebral infarction affecting unspecified side: Secondary | ICD-10-CM

## 2014-11-11 DIAGNOSIS — R32 Unspecified urinary incontinence: Secondary | ICD-10-CM

## 2014-11-11 DIAGNOSIS — I878 Other specified disorders of veins: Secondary | ICD-10-CM

## 2014-11-11 DIAGNOSIS — S82899A Other fracture of unspecified lower leg, initial encounter for closed fracture: Secondary | ICD-10-CM | POA: Insufficient documentation

## 2014-11-11 DIAGNOSIS — M129 Arthropathy, unspecified: Secondary | ICD-10-CM

## 2014-11-11 DIAGNOSIS — R296 Repeated falls: Secondary | ICD-10-CM

## 2014-11-11 DIAGNOSIS — D649 Anemia, unspecified: Secondary | ICD-10-CM | POA: Insufficient documentation

## 2014-11-11 DIAGNOSIS — I10 Essential (primary) hypertension: Secondary | ICD-10-CM

## 2014-11-11 DIAGNOSIS — R29898 Other symptoms and signs involving the musculoskeletal system: Secondary | ICD-10-CM

## 2014-11-11 DIAGNOSIS — E113299 Type 2 diabetes mellitus with mild nonproliferative diabetic retinopathy without macular edema, unspecified eye: Secondary | ICD-10-CM

## 2014-11-11 DIAGNOSIS — G9341 Metabolic encephalopathy: Secondary | ICD-10-CM

## 2014-11-11 DIAGNOSIS — E11319 Type 2 diabetes mellitus with unspecified diabetic retinopathy without macular edema: Secondary | ICD-10-CM | POA: Insufficient documentation

## 2014-11-11 DIAGNOSIS — E038 Other specified hypothyroidism: Secondary | ICD-10-CM

## 2014-11-11 DIAGNOSIS — E1142 Type 2 diabetes mellitus with diabetic polyneuropathy: Secondary | ICD-10-CM

## 2014-11-24 IMAGING — CR DG FOOT COMPLETE 3+V*L*
1 series · 3 of 3 positions shown · non-contrast
Comparison: none

REASON FOR EXAM: fall, pain
COMMENTS:

[Series 1: ap · 0.17mm/px · 3 of 3 slices shown]
[im 1/3]
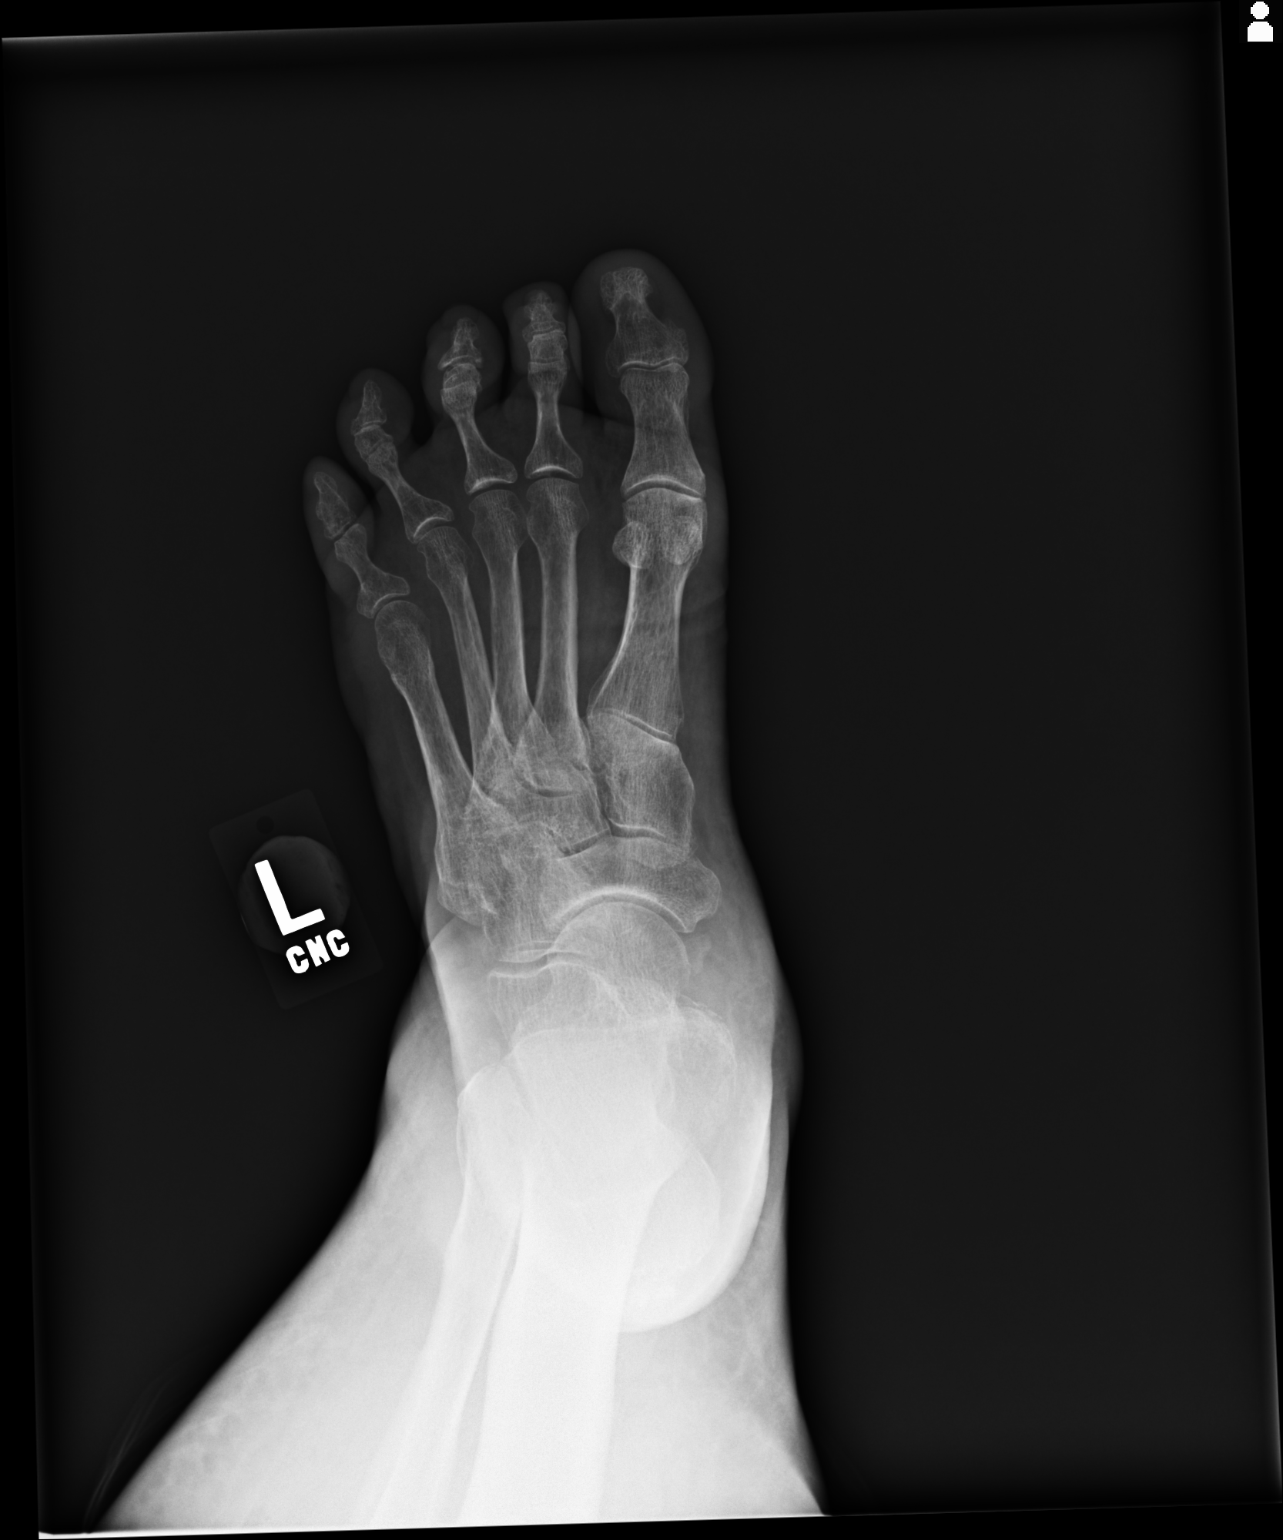
[im 2/3]
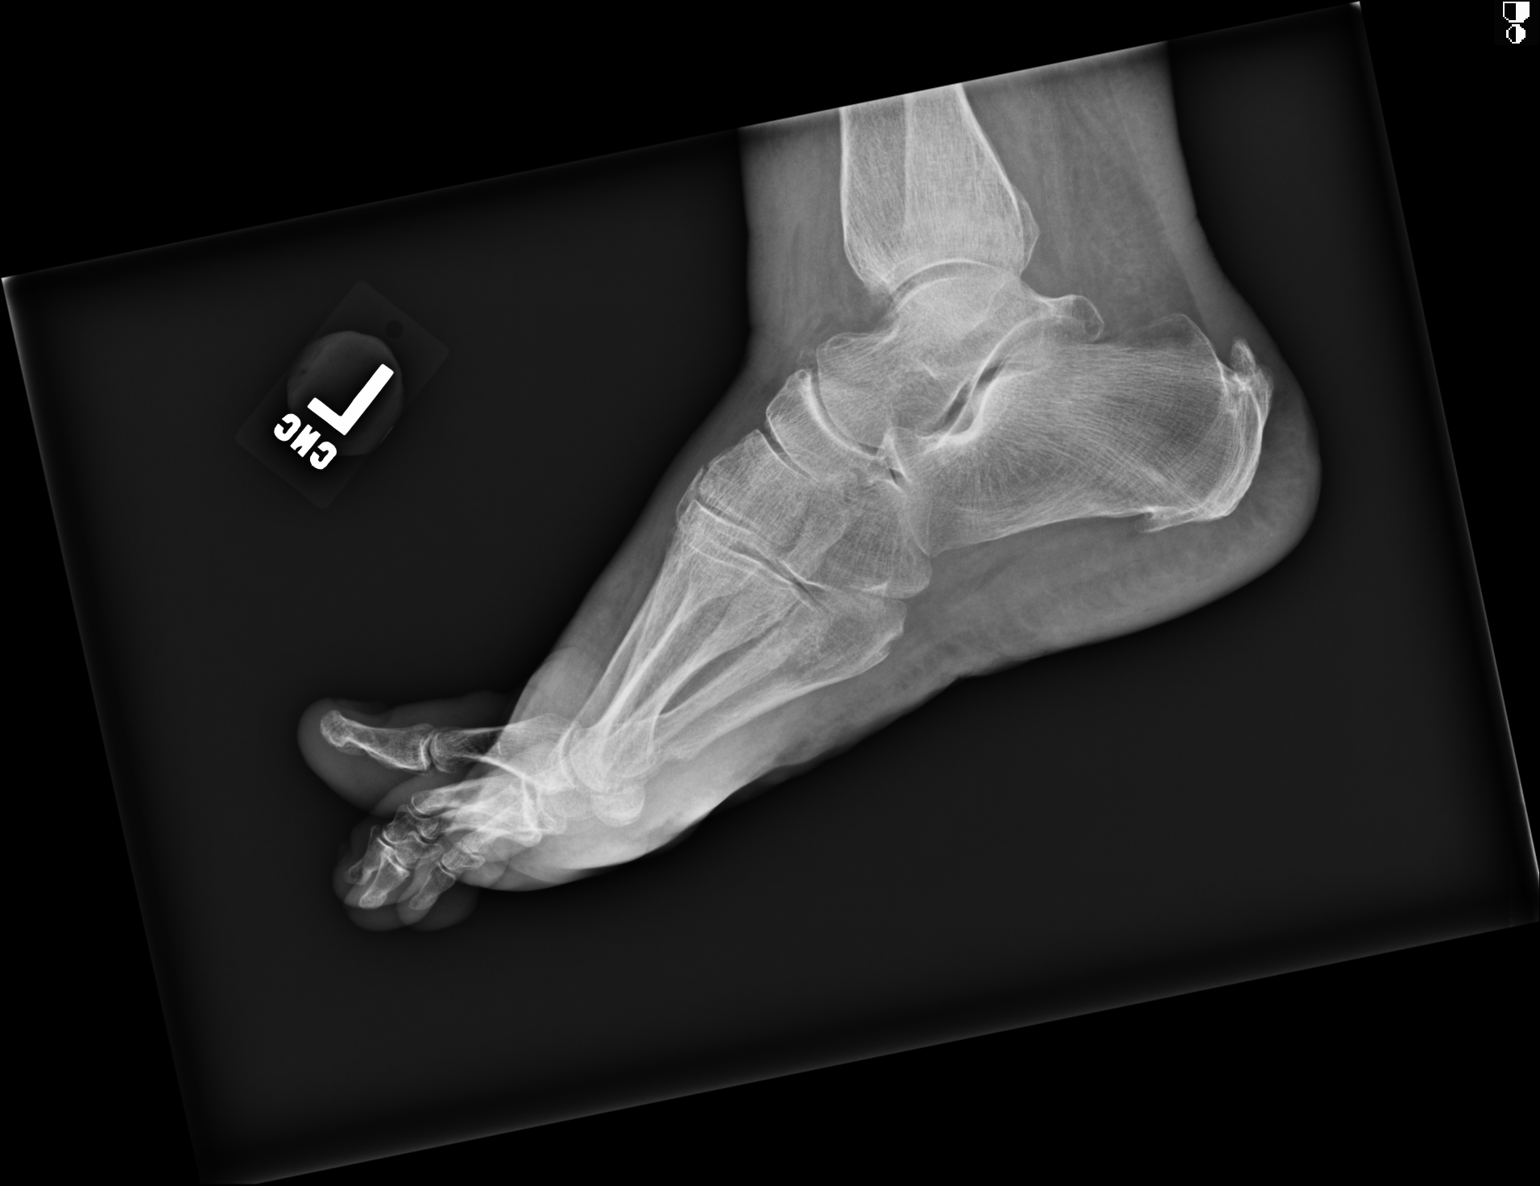
[im 3/3]
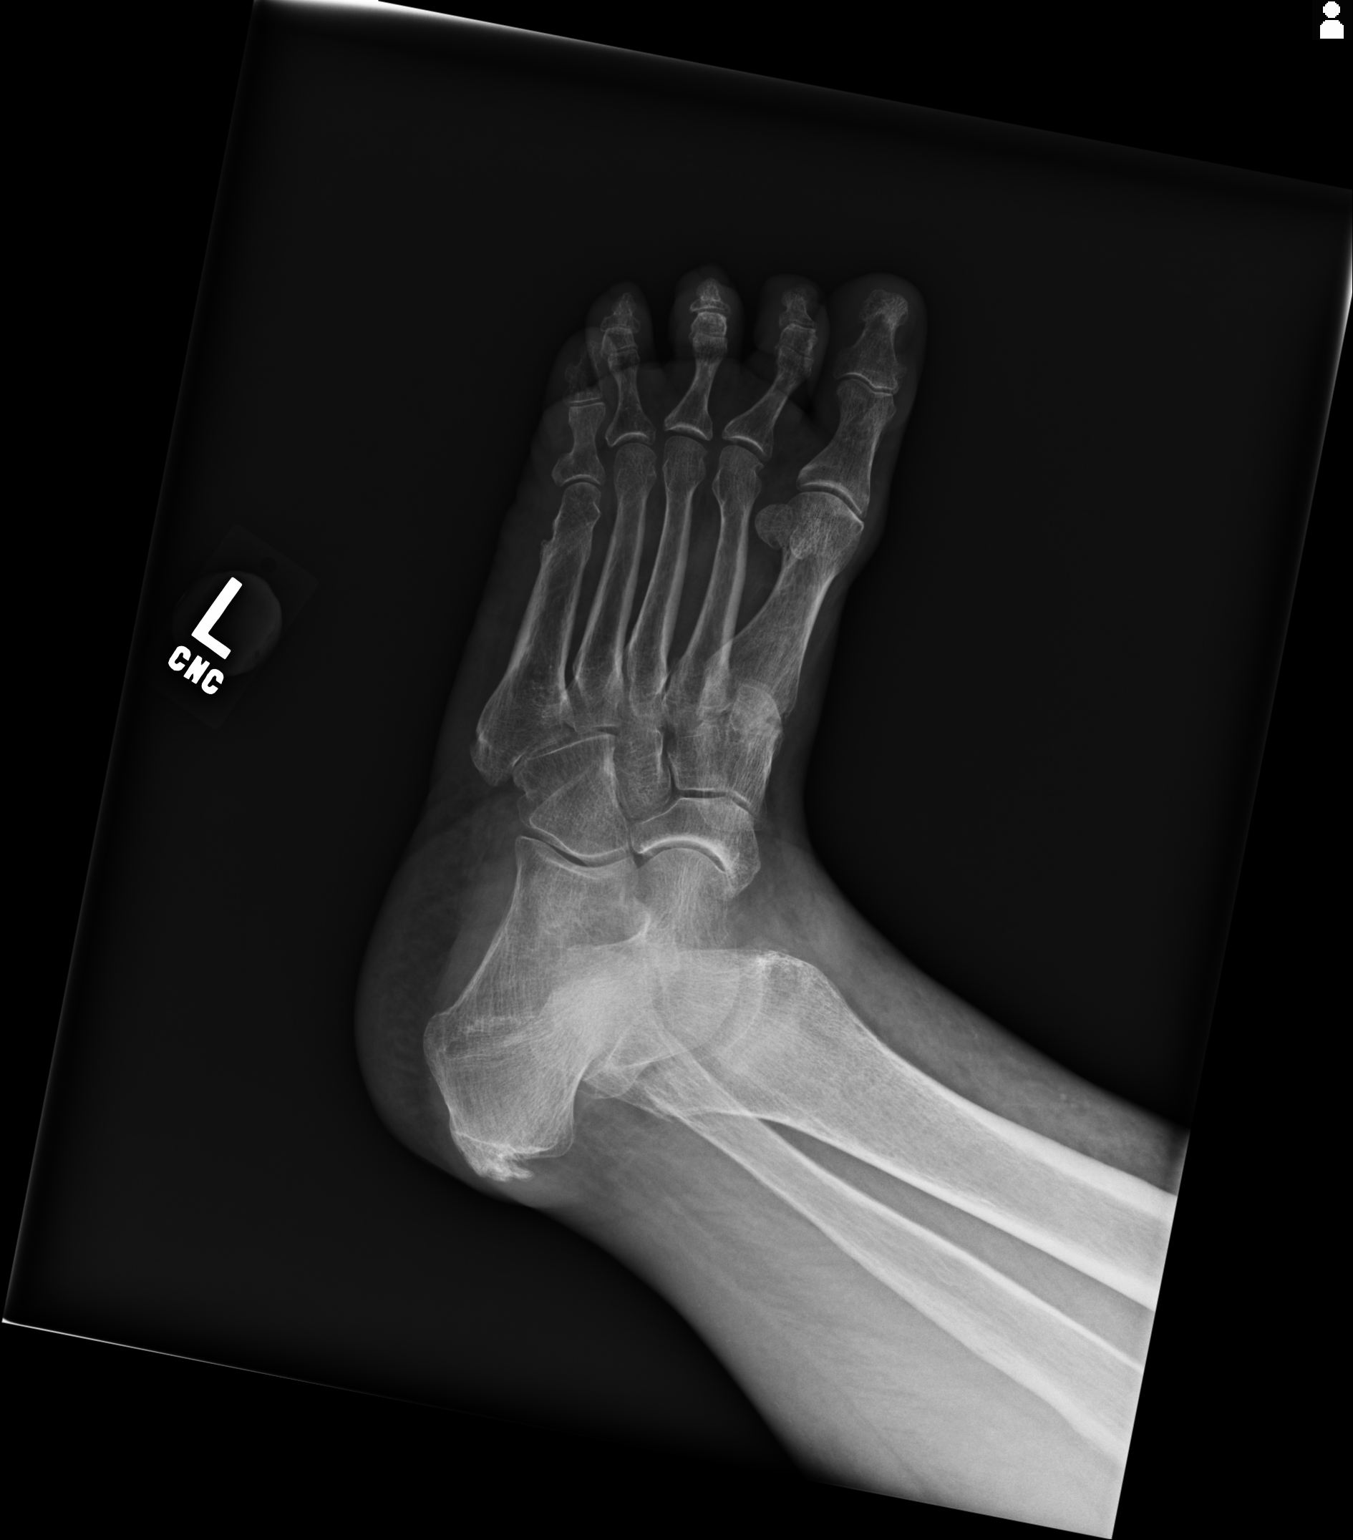

[3 of 3 positions shown; findings below may reference images not displayed]

PROCEDURE:     DXR - DXR FOOT LT COMP W/OBLIQUES  - January 25, 2013 [DATE]

RESULT:     There is spurring in the Achilles and plantar regions of the
calcaneus. There appears to be an old healed distal left fifth metatarsal
fracture. There is osteopenia with degenerative change. There is no evidence
of acute bony abnormality.
IMPRESSION: Please see above.

[REDACTED]

## 2014-11-24 IMAGING — CR DG ANKLE COMPLETE 3+V*L*
1 series · 4 of 4 positions shown · non-contrast
Comparison: none

REASON FOR EXAM: fall, pain
COMMENTS:

[Series 1: ap · 0.17mm/px · 4 of 4 slices shown]
[im 1/4]
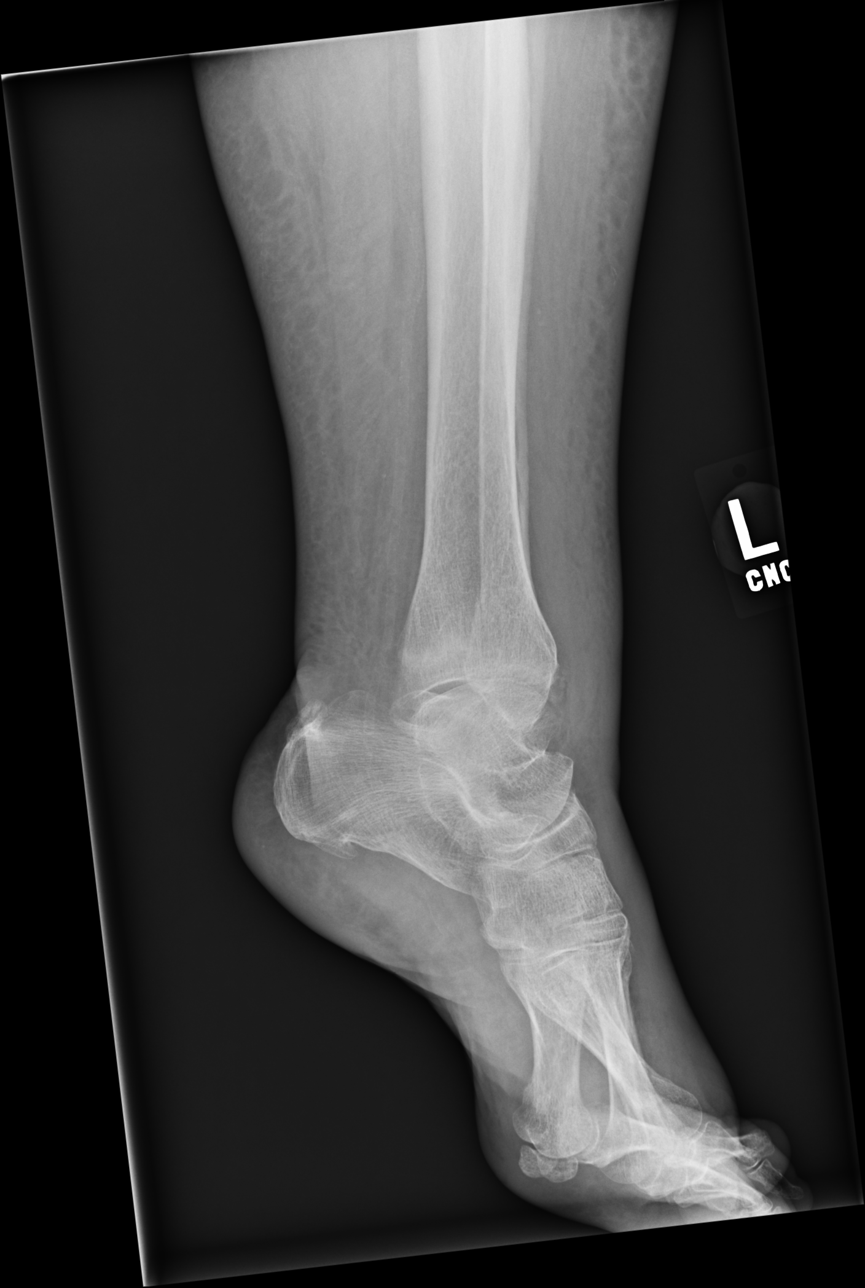
[im 2/4]
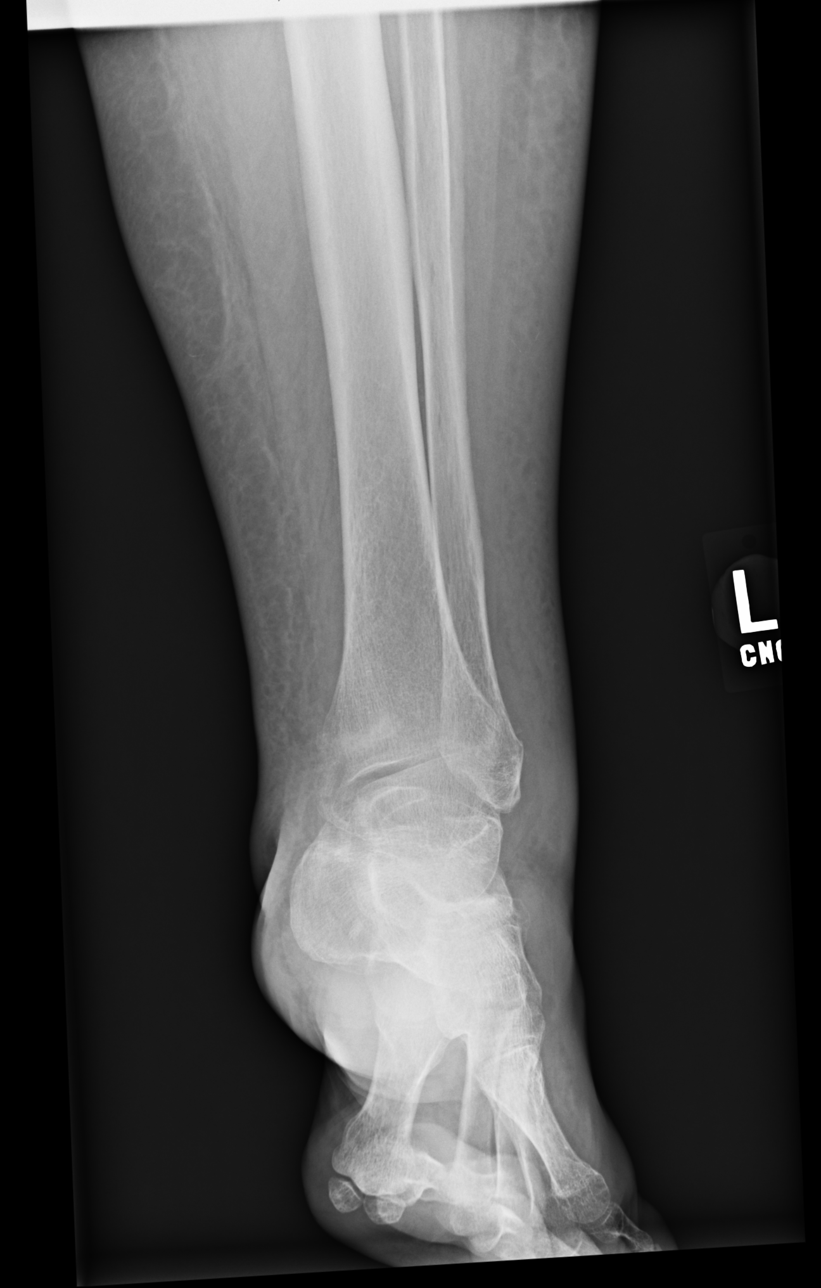
[im 3/4]
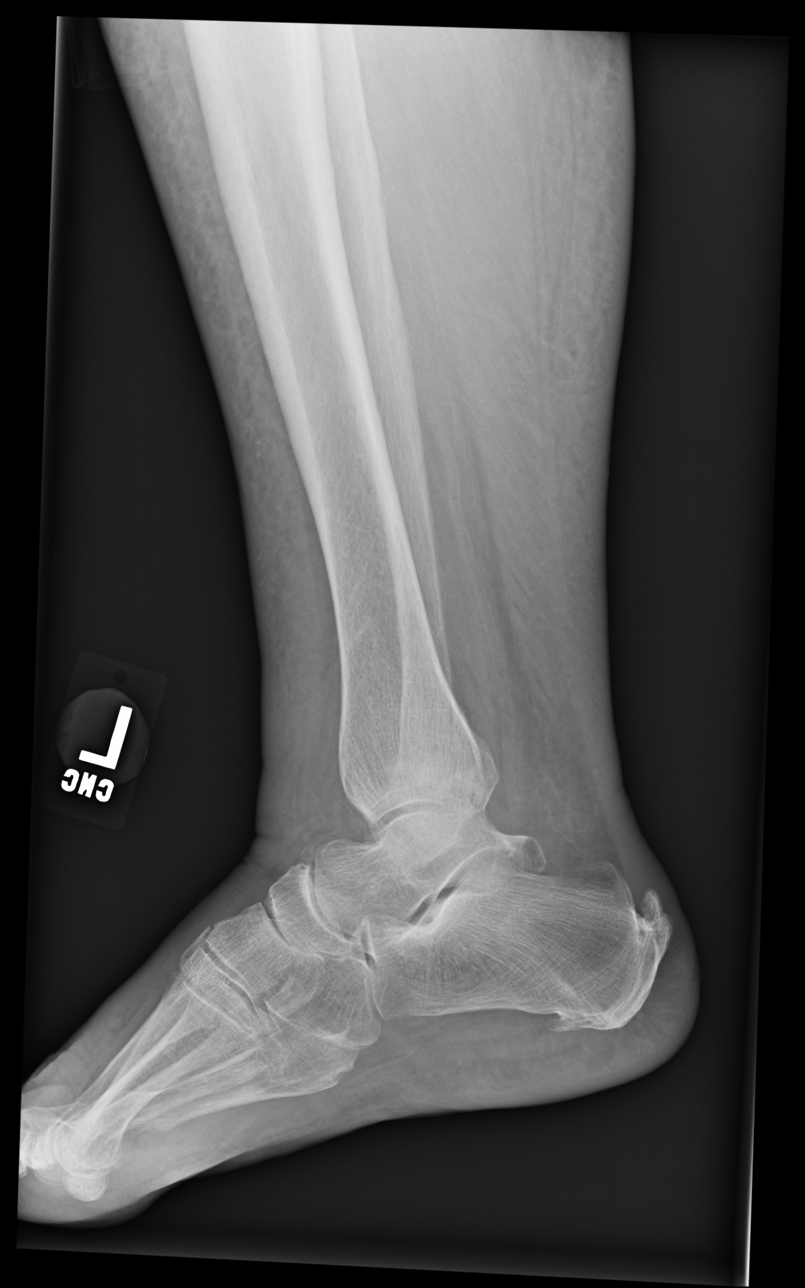
[im 4/4]
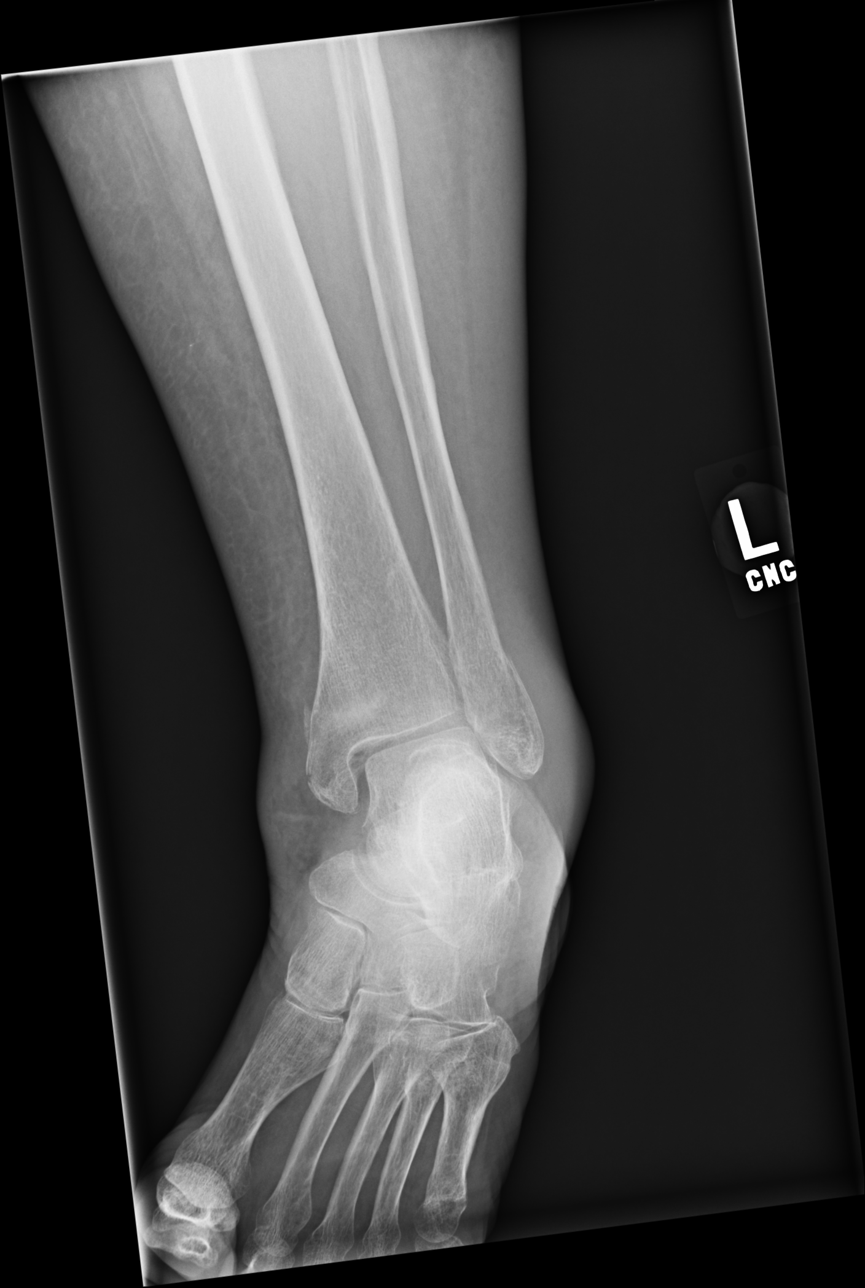

[4 of 4 positions shown; findings below may reference images not displayed]

PROCEDURE:     DXR - DXR ANKLE LEFT COMPLETE  - January 25, 2013 [DATE]

RESULT:     There is soft tissue prominence especially laterally with
evidence of osteopenia. There are nondisplaced fractures in the medial and
lateral malleolus without a definite posterior malleolar fracture.
Degenerative spurring is seen in the plantar and Achilles regions of the
calcaneus.
IMPRESSION: Findings concerning for fractures in the base of the medial
malleolus and in the lateral malleolus. The posterior malleolus appears
intact.

[REDACTED]

## 2014-12-24 ENCOUNTER — Encounter: Payer: Self-pay | Admitting: Family Medicine

## 2014-12-24 ENCOUNTER — Other Ambulatory Visit: Payer: Self-pay | Admitting: Family Medicine

## 2014-12-24 ENCOUNTER — Ambulatory Visit (INDEPENDENT_AMBULATORY_CARE_PROVIDER_SITE_OTHER): Payer: Commercial Managed Care - HMO | Admitting: Family Medicine

## 2014-12-24 VITALS — BP 136/62 | HR 72 | Temp 97.7°F | Resp 14 | Ht 62.0 in | Wt 277.0 lb

## 2014-12-24 DIAGNOSIS — G2581 Restless legs syndrome: Secondary | ICD-10-CM

## 2014-12-24 DIAGNOSIS — R29898 Other symptoms and signs involving the musculoskeletal system: Secondary | ICD-10-CM | POA: Diagnosis not present

## 2014-12-24 DIAGNOSIS — N390 Urinary tract infection, site not specified: Secondary | ICD-10-CM | POA: Diagnosis not present

## 2014-12-24 DIAGNOSIS — I739 Peripheral vascular disease, unspecified: Secondary | ICD-10-CM | POA: Diagnosis not present

## 2014-12-24 LAB — POCT URINALYSIS DIPSTICK
Bilirubin, UA: NEGATIVE
Glucose, UA: NEGATIVE
Ketones, UA: NEGATIVE
Nitrite, UA: POSITIVE
SPEC GRAV UA: 1.025
Urobilinogen, UA: 0.2
pH, UA: 6

## 2014-12-24 MED ORDER — CIPROFLOXACIN HCL 500 MG PO TABS: 500.0000 mg | ORAL_TABLET | Freq: Two times a day (BID) | ORAL | Status: DC

## 2014-12-24 MED ORDER — FERROUS SULFATE 325 (65 FE) MG PO TABS: 325.0000 mg | ORAL_TABLET | Freq: Every day | ORAL | Status: DC
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
## 2014-12-26 LAB — URINE CULTURE

## 2015-01-09 IMAGING — CR DG LUMBAR SPINE COMPLETE 4+V
1 series · 7 of 7 positions shown · non-contrast
Comparison: none

REASON FOR EXAM: fall 1 week ago back pain
COMMENTS:

PROCEDURE:     KDR - KDXR LUMBAR SPINE WITH OBLIQUES  - March 12, 2013  [DATE]
RESULT:

[Series 1: ap · 0.17mm/px · 7 of 7 slices shown]
[im 1/7]
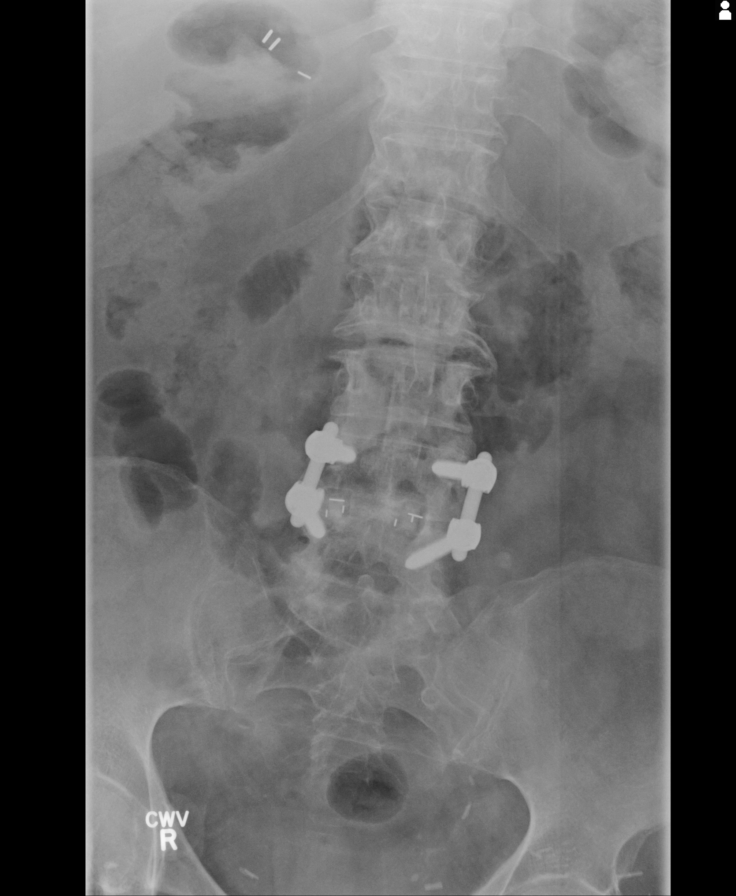
[im 2/7]
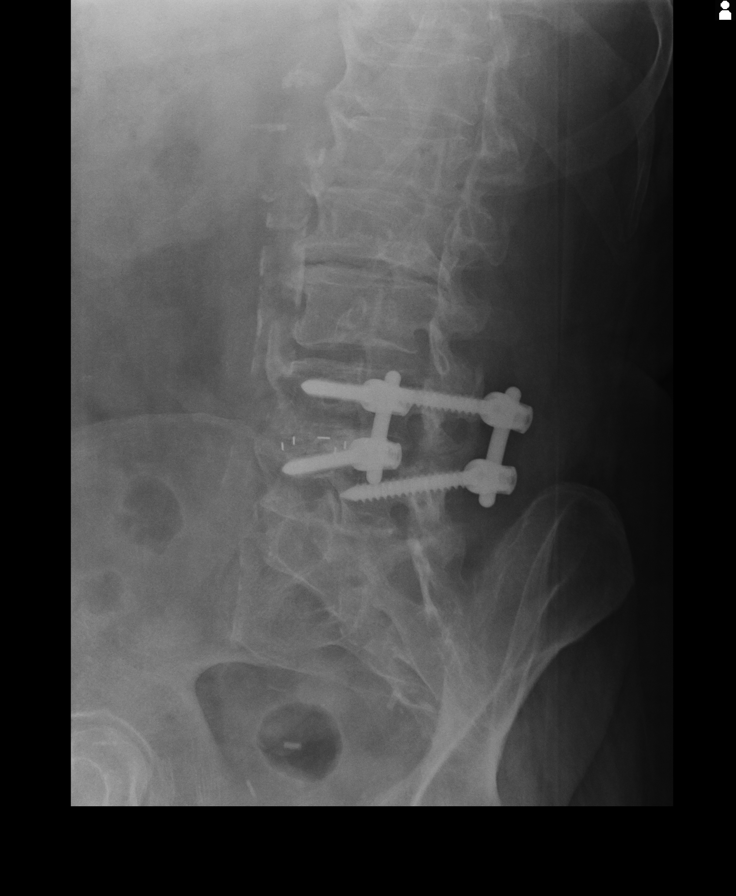
[im 3/7]
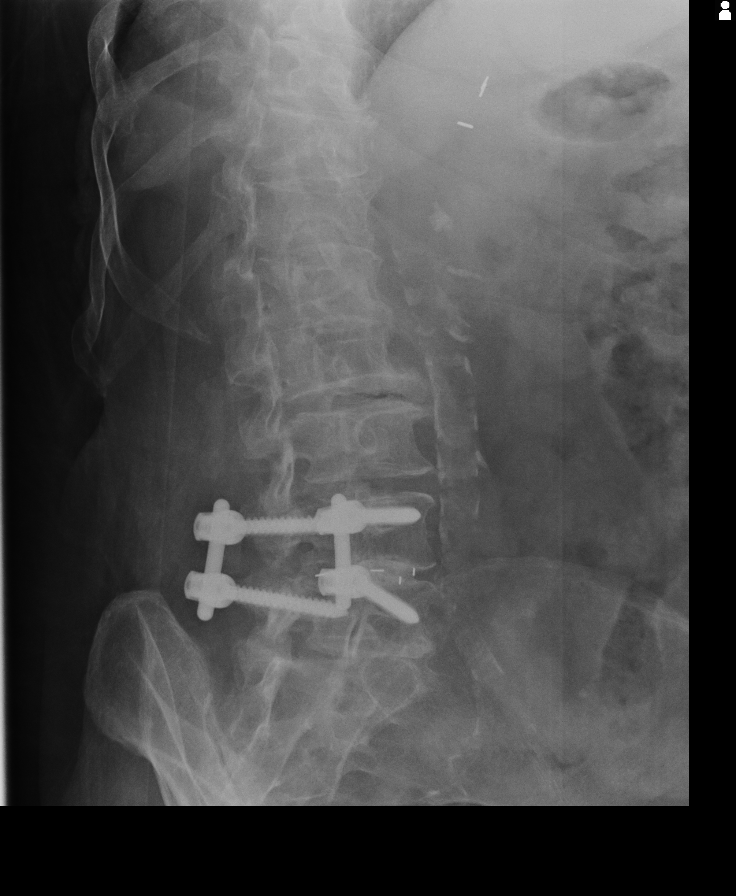
[im 4/7]
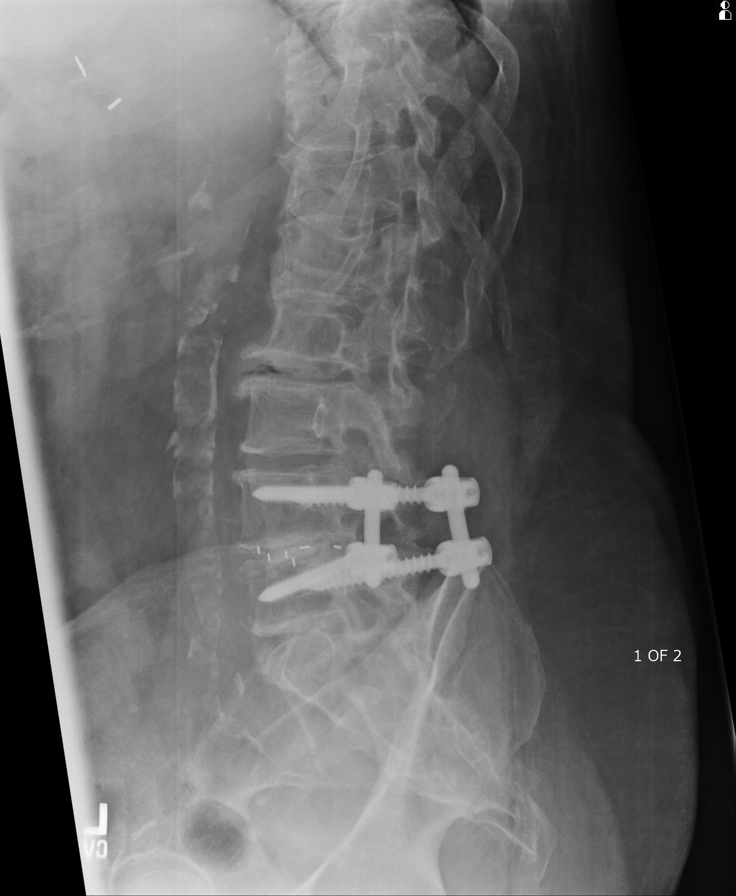
[im 5/7]
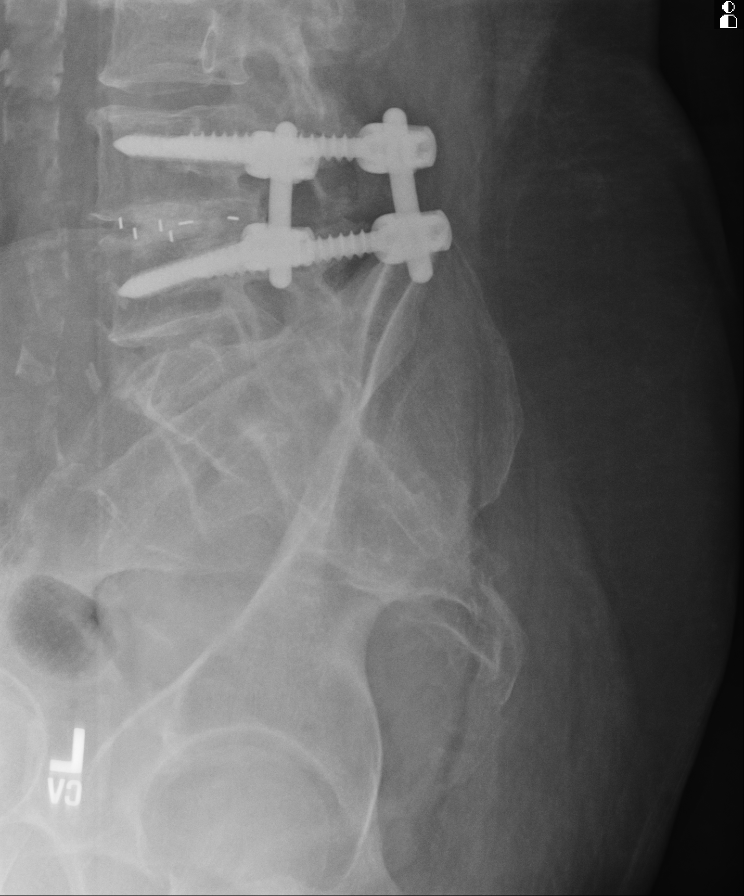
[im 6/7]
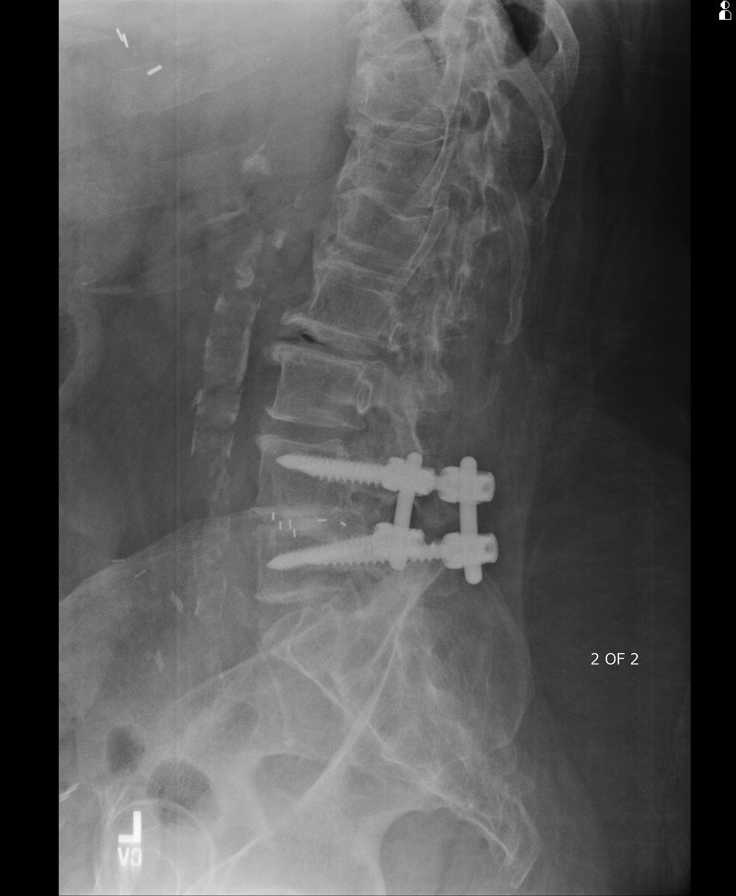
[im 7/7]
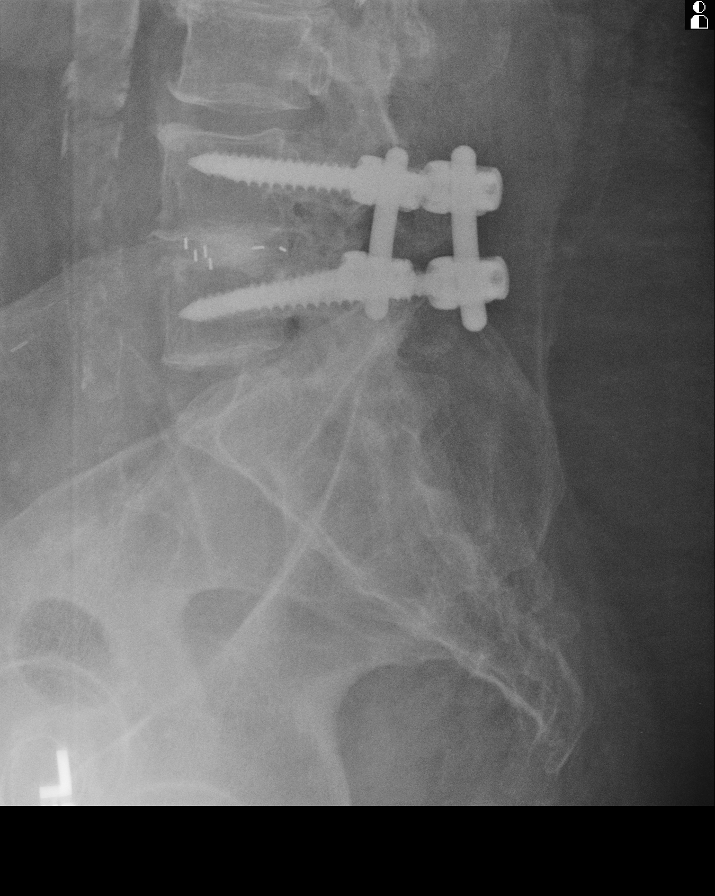

[7 of 7 positions shown; findings below may reference images not displayed]

FINDINGS: The patient is status post posterior fusion of the L4-5 vertebral
bodies. There is otherwise no evidence of fracture, dislocation or
malalignment. Multilevel degenerative disc disease changes are identified
within the lumbar spine. Hardware appears intact without evidence of
loosening or failure.
IMPRESSION: 1.  No evidence of acute osseous abnormalities.
2.  Multilevel degenerative disc disease changes.

## 2015-01-25 ENCOUNTER — Telehealth: Payer: Self-pay | Admitting: Family Medicine

## 2015-02-06 IMAGING — CR DG ANKLE 2V *L*
1 series · 2 of 2 positions shown · non-contrast
Comparison: none

REASON FOR EXAM: Dx lt ankle pain
COMMENTS:

[Series 2: x ankle ap left · 0.14mm/px · 2 of 2 slices shown]
[im 1/2]
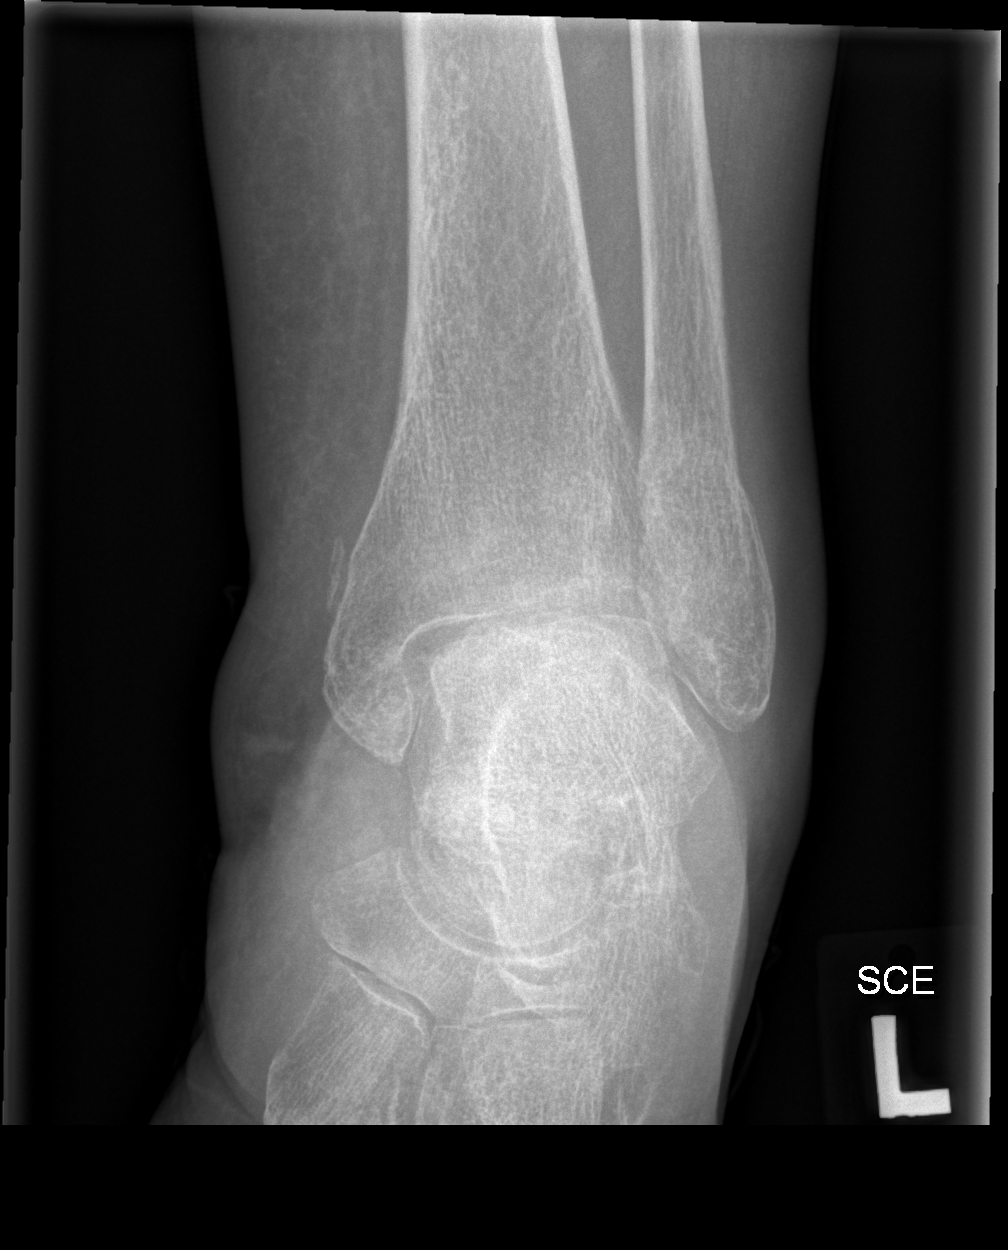
[im 2/2]
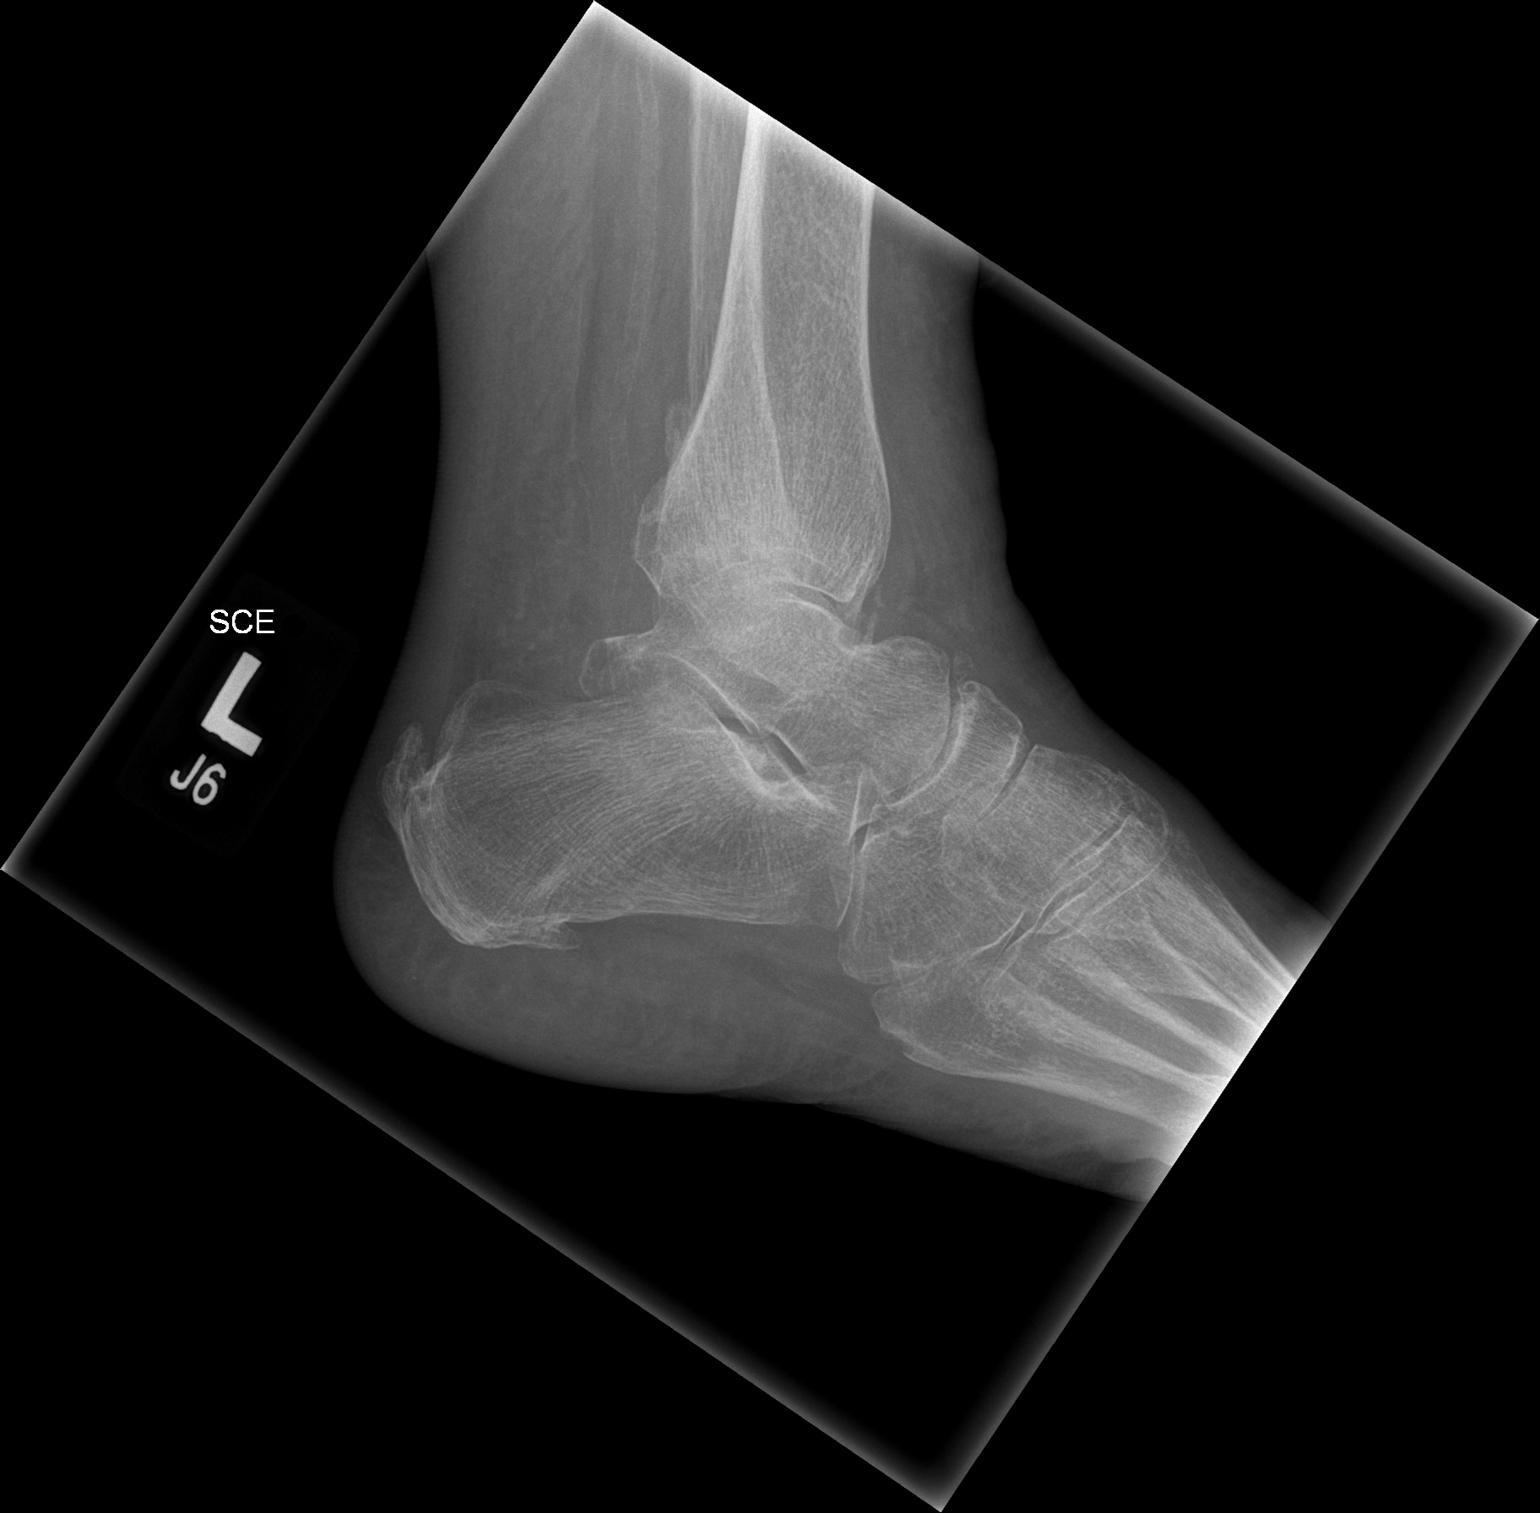

[2 of 2 positions shown; findings below may reference images not displayed]

PROCEDURE:     DXR - DXR ANKLE LEFT AP AND LATERAL  - April 09, 2013 [DATE]

RESULT:     There is significant osteopenia with soft tissue swelling. There
is fracture suggested at the base of the medial malleolus. Only 2 views are
submitted. Complete ankle series may be beneficial. The calcaneus shows
spurring in the Achilles and plantar regions. Talus appears intact. There is
some sclerosis in the distal fibular shaft which could represent a site of
nondisplaced fracture.
IMPRESSION: Significant osteopenia. Findings concerning for fracture at
the base of the medial malleolus. There is some sclerosis in the distal
fibula which could represent an area of nondisplaced fracture as well.
Complete ankle series would be helpful.

[REDACTED]

## 2015-02-12 ENCOUNTER — Other Ambulatory Visit: Payer: Self-pay | Admitting: Family Medicine

## 2015-02-19 ENCOUNTER — Encounter: Payer: Self-pay | Admitting: Cardiovascular Disease

## 2015-02-19 ENCOUNTER — Ambulatory Visit (INDEPENDENT_AMBULATORY_CARE_PROVIDER_SITE_OTHER): Payer: Commercial Managed Care - HMO | Admitting: Family Medicine

## 2015-02-19 ENCOUNTER — Encounter: Payer: Self-pay | Admitting: Family Medicine

## 2015-02-19 VITALS — BP 118/66 | HR 74 | Temp 98.5°F | Resp 16 | Wt 284.4 lb

## 2015-02-19 DIAGNOSIS — N3090 Cystitis, unspecified without hematuria: Secondary | ICD-10-CM | POA: Diagnosis not present

## 2015-02-19 LAB — POCT URINALYSIS DIPSTICK
GLUCOSE UA: 100
Nitrite, UA: NEGATIVE
PH UA: 5
Protein, UA: 2000
UROBILINOGEN UA: 0.2

## 2015-02-19 MED ORDER — AMOXICILLIN-POT CLAVULANATE 875-125 MG PO TABS: 1.0000 | ORAL_TABLET | Freq: Two times a day (BID) | ORAL | Status: DC

## 2015-02-19 NOTE — Progress Notes (Signed)
Subjective:     Patient ID: Susan House, female   DOB: 1956-06-27, 59 y.o.   MRN: [id number]  HPI  Chief Complaint  Patient presents with  . Dysuria    Patient comes in office today with concerns of urinary tract infection. Patient states for the past 3 days she has had burning with urination, patient denies frequency of urination but states that she has had incontinence. Patient reports taking Cipro for 2 days but it did not help with her symptoms  Also has noticed increased frequency of her usually liquid stools from once to five x day. Accompanied by her husband today.   Review of Systems  Constitutional: Negative for fever and chills.  Gastrointestinal: Negative for nausea and vomiting.       Objective:   Physical Exam  Constitutional: She appears well-developed and well-nourished. No distress.  Genitourinary:  No cva tenderness       Assessment:    1. Cystitis - Urine culture - POCT urinalysis dipstick - amoxicillin-clavulanate (AUGMENTIN) 875-125 MG per tablet; Take 1 tablet by mouth 2 (two) times daily.  Dispense: 14 tablet; Refill: 0    Plan:    Imodium for increased frequency of stools. Further f/u pending urine culture report.

## 2015-02-19 NOTE — Patient Instructions (Addendum)
We will call you with the culture result. Discussed use of imodium for increased frequency of stools.

## 2015-02-21 LAB — URINE CULTURE

## 2015-02-22 ENCOUNTER — Telehealth: Payer: Self-pay

## 2015-02-22 NOTE — Telephone Encounter (Signed)
Patient was advised of culture she states that symptoms have improved since taking Augmentin. Advised patient to complete antibiotic, if symptoms return to give our office a call back.KW

## 2015-02-22 NOTE — Telephone Encounter (Signed)
Pt returned call. Thanks TNP °

## 2015-02-22 NOTE — Telephone Encounter (Signed)
[id number] 

## 2015-02-22 NOTE — Telephone Encounter (Signed)
-----   Message from Carmon Ginsberg, [place] sent at 02/22/2015 11:58 AM EDT ----- No specific organism but his may have been altered by the use of Cipro. Are you improving with Augmentin?

## 2015-03-06 ENCOUNTER — Emergency Department
Admission: EM | Admit: 2015-03-06 | Discharge: 2015-03-06 | Disposition: A | Discharge status: Home / Self Care | Payer: Commercial Managed Care - HMO | Attending: Emergency Medicine | Admitting: Emergency Medicine

## 2015-03-06 DIAGNOSIS — E11319 Type 2 diabetes mellitus with unspecified diabetic retinopathy without macular edema: Secondary | ICD-10-CM | POA: Diagnosis not present

## 2015-03-06 DIAGNOSIS — Z79899 Other long term (current) drug therapy: Secondary | ICD-10-CM | POA: Insufficient documentation

## 2015-03-06 DIAGNOSIS — Z981 Arthrodesis status: Secondary | ICD-10-CM | POA: Insufficient documentation

## 2015-03-06 DIAGNOSIS — Z792 Long term (current) use of antibiotics: Secondary | ICD-10-CM | POA: Diagnosis not present

## 2015-03-06 DIAGNOSIS — E1149 Type 2 diabetes mellitus with other diabetic neurological complication: Secondary | ICD-10-CM | POA: Insufficient documentation

## 2015-03-06 DIAGNOSIS — G8929 Other chronic pain: Secondary | ICD-10-CM | POA: Insufficient documentation

## 2015-03-06 DIAGNOSIS — Z7952 Long term (current) use of systemic steroids: Secondary | ICD-10-CM | POA: Insufficient documentation

## 2015-03-06 DIAGNOSIS — I10 Essential (primary) hypertension: Secondary | ICD-10-CM | POA: Insufficient documentation

## 2015-03-06 DIAGNOSIS — Z7982 Long term (current) use of aspirin: Secondary | ICD-10-CM | POA: Diagnosis not present

## 2015-03-06 DIAGNOSIS — Z7902 Long term (current) use of antithrombotics/antiplatelets: Secondary | ICD-10-CM | POA: Diagnosis not present

## 2015-03-06 DIAGNOSIS — I69352 Hemiplegia and hemiparesis following cerebral infarction affecting left dominant side: Secondary | ICD-10-CM | POA: Diagnosis not present

## 2015-03-06 DIAGNOSIS — M545 Low back pain: Secondary | ICD-10-CM | POA: Insufficient documentation

## 2015-03-06 DIAGNOSIS — Z794 Long term (current) use of insulin: Secondary | ICD-10-CM | POA: Insufficient documentation

## 2015-03-06 MED ORDER — KETOROLAC TROMETHAMINE 60 MG/2ML IM SOLN
60.0000 mg | Freq: Once | INTRAMUSCULAR | Status: AC
  Administered 2015-03-06: 60 mg via INTRAMUSCULAR
  Filled 2015-03-06: qty 2

## 2015-03-06 MED ORDER — ORPHENADRINE CITRATE 30 MG/ML IJ SOLN
60.0000 mg | INTRAMUSCULAR | Status: AC
  Administered 2015-03-06: 60 mg via INTRAMUSCULAR
  Filled 2015-03-06: qty 2

## 2015-03-06 MED ORDER — KETOROLAC TROMETHAMINE 10 MG PO TABS: 10.0000 mg | ORAL_TABLET | Freq: Three times a day (TID) | ORAL | Status: DC

## 2015-03-06 MED ORDER — CYCLOBENZAPRINE HCL 5 MG PO TABS: 5.0000 mg | ORAL_TABLET | Freq: Three times a day (TID) | ORAL | Status: DC | PRN

## 2015-03-06 NOTE — Discharge Instructions (Signed)
Chronic Back Pain  When back pain lasts longer than 3 months, it is called chronic back pain.People with chronic back pain often go through certain periods that are more intense (flare-ups).  CAUSES Chronic back pain can be caused by wear and tear (degeneration) on different structures in your back. These structures include:  The bones of your spine (vertebrae) and the joints surrounding your spinal cord and nerve roots (facets).  The strong, fibrous tissues that connect your vertebrae (ligaments). Degeneration of these structures may result in pressure on your nerves. This can lead to constant pain. HOME CARE INSTRUCTIONS  Avoid bending, heavy lifting, prolonged sitting, and activities which make the problem worse.  Take brief periods of rest throughout the day to reduce your pain. Lying down or standing usually is better than sitting while you are resting.  Take over-the-counter or prescription medicines only as directed by your caregiver. SEEK IMMEDIATE MEDICAL CARE IF:   You have weakness or numbness in one of your legs or feet.  You have trouble controlling your bladder or bowels.  You have nausea, vomiting, abdominal pain, shortness of breath, or fainting. Document Released: 08/10/2004 Document Revised: 09/25/2011 Document Reviewed: 06/17/2011 [facility] Patient Information 2015 [place], [place]. This information is not intended to replace advice given to you by your health care provider. Make sure you discuss any questions you have with your health care provider.  Radicular Pain Radicular pain in either the arm or leg is usually from a bulging or herniated disk in the spine. A piece of the herniated disk may press against the nerves as the nerves exit the spine. This causes pain which is felt at the tips of the nerves down the arm or leg. Other causes of radicular pain may include:  Fractures.  Heart disease.  Cancer.  An abnormal and usually degenerative state of the nervous  system or nerves (neuropathy). Diagnosis may require CT or MRI scanning to determine the primary cause.  Nerves that start at the neck (nerve roots) may cause radicular pain in the outer shoulder and arm. It can spread down to the thumb and fingers. The symptoms vary depending on which nerve root has been affected. In most cases radicular pain improves with conservative treatment. Neck problems may require physical therapy, a neck collar, or cervical traction. Treatment may take many weeks, and surgery may be considered if the symptoms do not improve.  Conservative treatment is also recommended for sciatica. Sciatica causes pain to radiate from the lower back or buttock area down the leg into the foot. Often there is a history of back problems. Most patients with sciatica are better after 2 to 4 weeks of rest and other supportive care. Short term bed rest can reduce the disk pressure considerably. Sitting, however, is not a good position since this increases the pressure on the disk. You should avoid bending, lifting, and all other activities which make the problem worse. Traction can be used in severe cases. Surgery is usually reserved for patients who do not improve within the first months of treatment. Only take over-the-counter or prescription medicines for pain, discomfort, or fever as directed by your caregiver. Narcotics and muscle relaxants may help by relieving more severe pain and spasm and by providing mild sedation. Cold or massage can give significant relief. Spinal manipulation is not recommended. It can increase the degree of disc protrusion. Epidural steroid injections are often effective treatment for radicular pain. These injections deliver medicine to the spinal nerve in the space between  the protective covering of the spinal cord and back bones (vertebrae). Your caregiver can give you more information about steroid injections. These injections are most effective when given within two weeks of  the onset of pain.  You should see your caregiver for follow up care as recommended. A program for neck and back injury rehabilitation with stretching and strengthening exercises is an important part of management.  SEEK IMMEDIATE MEDICAL CARE IF:  You develop increased pain, weakness, or numbness in your arm or leg.  You develop difficulty with bladder or bowel control.  You develop abdominal pain. Document Released: 08/10/2004 Document Revised: 09/25/2011 Document Reviewed: 10/26/2008 [facility] Patient Information 2015 [place], [place]. This information is not intended to replace advice given to you by your health care provider. Make sure you discuss any questions you have with your health care provider. Emergency care providers appreciate that many patients coming to [place] are in severe pain and we wish to address their pain in the safest, most responsible manner.  It is important to recognize, however, that the proper treatment of chronic pain differs from that of the pain of injuries and acute illnesses.  Our goal is to provider quality, safe, personalized care and we thank you for giving [place] the opportunity to serve you.  The use of narcotics and related agents for chronic pain syndromes may lead to additional physical and psychological problems.  Nearly as many people die from prescription narcotics each year as die from car crashes.  Additionally, this risk is increased if such prescriptions are obtained from a variety of sources.  Therefore, only your primary care physician or a pain management specialist is able to safely treat such syndromes with narcotic medications long-term.  Documentation revealing such prescriptions have been sought from multiple sources may prohibit [place] from providing a refill or different narcotic medication.  Your name may be checked first through the [place].  This database is a record of controlled substance medication  prescriptions that the patient has received.  This has been established by [facility] in an effort to eliminate the dangerous, and often life-threatening, practice of obtaining multiple prescriptions from different medical providers.  If you have a chronic pain syndrome (i.e. chronic headaches, recurrent back or neck pain, dental pain, abdominal or pelvic pain without a specific diagnosis, or neuropathic pain such as fibromyalgia) or recurrent visits for the same condition without an acute diagnosis, you may be treated with non-narcotics and other non-addictive medicines.  Allergic reactions or negative side effects that may be reported by a patient to such medications will not typically lead to the use of a narcotic analgesic or other controlled substance as an alternative.  Patients managing chronic pain with a personal physician should have provisions in place for breakthrough pain.  If you are in crisis, you should call your physician.  If your physician directs you to the emergency department, please have the doctor call and speak to our attending physician concerning your care.  When patients come to the Emergency Department (ED) with acute medical conditions in which the ED physician feels it is appropriate to prescribe narcotic or sedating pain medication, the physician will prescribe these in very limited quantities.  The amount of these medications will last only until you can see your primary care physician in his/her office.  Any patient who returns to the ED seeking refills should expect only non-narcotic pain medications.  In the event an acute medical condition exists and the  emergency physician feels it is necessary that the patient be given a narcotic or sedating medication, a responsible adult driver should be present in the room prior to the medication being given by the nurse.  Prescriptions for narcotic or sedating medications that have been lost, stolen, or expired will NOT be  refilled in the ED.  Patients who have chronic pain may receive non-narcotic prescriptions until seen by their primary care physician.  It is every patient's personal responsibility to maintain active prescriptions with his or her primary care physician or specialist.    Take the prescription meds as directed. Follow-up with your provider for further evaluation and management.
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
## 2015-03-06 NOTE — ED Notes (Signed)
Pt is having back pain and states that it has not got worse today and started three days ago. Pt states a history of back pain and surgery.

## 2015-03-08 ENCOUNTER — Ambulatory Visit (INDEPENDENT_AMBULATORY_CARE_PROVIDER_SITE_OTHER): Payer: Commercial Managed Care - HMO | Admitting: Family Medicine

## 2015-03-08 ENCOUNTER — Encounter: Payer: Self-pay | Admitting: Family Medicine

## 2015-03-08 ENCOUNTER — Telehealth: Payer: Self-pay | Admitting: Family Medicine

## 2015-03-08 VITALS — BP 148/80 | HR 88 | Temp 97.8°F | Resp 16

## 2015-03-08 DIAGNOSIS — M545 Low back pain: Secondary | ICD-10-CM

## 2015-03-08 DIAGNOSIS — M461 Sacroiliitis, not elsewhere classified: Secondary | ICD-10-CM | POA: Diagnosis not present

## 2015-03-08 DIAGNOSIS — M79602 Pain in left arm: Secondary | ICD-10-CM | POA: Diagnosis not present

## 2015-03-08 MED ORDER — HYDROMORPHONE HCL 2 MG PO TABS: ORAL_TABLET | ORAL | Status: DC

## 2015-03-08 MED ORDER — PREDNISONE 10 MG (21) PO TBPK: 10.0000 mg | ORAL_TABLET | Freq: Every day | ORAL | Status: DC

## 2015-03-08 NOTE — Telephone Encounter (Signed)
Noted-aa 
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
## 2015-03-08 NOTE — Telephone Encounter (Signed)
Pt eas discharged from [facility] ER on 03/06/2015 for back pain.  Pt is scheduled to come in today for a follow up/MW

## 2015-03-11 ENCOUNTER — Encounter: Payer: Self-pay | Admitting: Family Medicine

## 2015-03-11 ENCOUNTER — Ambulatory Visit (INDEPENDENT_AMBULATORY_CARE_PROVIDER_SITE_OTHER): Payer: Commercial Managed Care - HMO | Admitting: Family Medicine

## 2015-03-11 VITALS — BP 124/78 | HR 93 | Temp 97.5°F | Resp 16

## 2015-03-11 DIAGNOSIS — Z0000 Encounter for general adult medical examination without abnormal findings: Secondary | ICD-10-CM

## 2015-03-11 DIAGNOSIS — M461 Sacroiliitis, not elsewhere classified: Secondary | ICD-10-CM | POA: Diagnosis not present

## 2015-03-11 DIAGNOSIS — M545 Low back pain: Secondary | ICD-10-CM
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
## 2015-03-25 ENCOUNTER — Encounter: Payer: Self-pay | Admitting: Family Medicine

## 2015-03-25 ENCOUNTER — Ambulatory Visit (INDEPENDENT_AMBULATORY_CARE_PROVIDER_SITE_OTHER): Payer: Commercial Managed Care - HMO | Admitting: Family Medicine

## 2015-03-25 VITALS — BP 148/62 | HR 96 | Temp 97.9°F | Resp 18 | Wt 280.0 lb

## 2015-03-25 DIAGNOSIS — N390 Urinary tract infection, site not specified: Secondary | ICD-10-CM

## 2015-03-25 LAB — POCT URINALYSIS DIPSTICK
BILIRUBIN UA: NEGATIVE
GLUCOSE UA: 2000
Ketones, UA: NEGATIVE
NITRITE UA: POSITIVE
PH UA: 5
Spec Grav, UA: 1.02
Urobilinogen, UA: 0.2

## 2015-03-25 MED ORDER — CIPROFLOXACIN HCL 250 MG PO TABS: 250.0000 mg | ORAL_TABLET | Freq: Two times a day (BID) | ORAL | Status: DC
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
## 2015-03-26 ENCOUNTER — Other Ambulatory Visit: Payer: Self-pay | Admitting: Family Medicine

## 2015-03-29 LAB — URINE CULTURE

## 2015-04-01 ENCOUNTER — Other Ambulatory Visit: Payer: Self-pay

## 2015-04-01 ENCOUNTER — Telehealth: Payer: Self-pay | Admitting: Family Medicine

## 2015-04-01 MED ORDER — CLOPIDOGREL BISULFATE 75 MG PO TABS: 75.0000 mg | ORAL_TABLET | Freq: Every day | ORAL | Status: DC

## 2015-04-01 NOTE — Telephone Encounter (Signed)
Spoke with patient, she needed her Plavix filled. Taking care of-aa

## 2015-04-01 NOTE — Telephone Encounter (Signed)
Pt would like Ana to return her call. Thanks TNP

## 2015-04-02 ENCOUNTER — Encounter: Payer: Self-pay | Admitting: Family Medicine

## 2015-04-02 ENCOUNTER — Ambulatory Visit: Payer: Commercial Managed Care - HMO | Admitting: Cardiovascular Disease

## 2015-04-08 ENCOUNTER — Encounter: Payer: Self-pay | Admitting: Family Medicine

## 2015-04-12 ENCOUNTER — Encounter: Payer: Self-pay | Admitting: Family Medicine

## 2015-04-12 ENCOUNTER — Ambulatory Visit (INDEPENDENT_AMBULATORY_CARE_PROVIDER_SITE_OTHER): Payer: Commercial Managed Care - HMO | Admitting: Family Medicine

## 2015-04-12 VITALS — BP 142/82 | HR 68 | Temp 98.3°F | Resp 16

## 2015-04-12 DIAGNOSIS — Z23 Encounter for immunization: Secondary | ICD-10-CM

## 2015-04-12 MED ORDER — HYDROCODONE-ACETAMINOPHEN 10-325 MG PO TABS: 1.0000 | ORAL_TABLET | ORAL | Status: DC | PRN
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
## 2015-04-23 ENCOUNTER — Encounter: Payer: Self-pay | Admitting: Family Medicine

## 2015-04-24 ENCOUNTER — Encounter: Payer: Self-pay | Admitting: Family Medicine

## 2015-04-24 ENCOUNTER — Ambulatory Visit (INDEPENDENT_AMBULATORY_CARE_PROVIDER_SITE_OTHER): Payer: Commercial Managed Care - HMO | Admitting: Family Medicine

## 2015-04-24 VITALS — BP 136/80 | HR 92 | Temp 97.7°F | Resp 20

## 2015-04-24 DIAGNOSIS — J320 Chronic maxillary sinusitis: Secondary | ICD-10-CM | POA: Diagnosis not present

## 2015-04-24 MED ORDER — AMOXICILLIN 875 MG PO TABS
875.0000 mg | ORAL_TABLET | Freq: Two times a day (BID) | ORAL | Status: DC
Start: 2015-04-24 — End: 2015-05-03
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
## 2015-04-26 ENCOUNTER — Encounter: Payer: Self-pay | Admitting: Family Medicine

## 2015-04-26 ENCOUNTER — Ambulatory Visit (INDEPENDENT_AMBULATORY_CARE_PROVIDER_SITE_OTHER): Payer: Commercial Managed Care - HMO | Admitting: Family Medicine

## 2015-04-26 ENCOUNTER — Ambulatory Visit
Admission: RE | Admit: 2015-04-26 | Discharge: 2015-04-26 | Disposition: A | Discharge status: Home / Self Care | Payer: Commercial Managed Care - HMO | Source: Ambulatory Visit | Attending: Family Medicine | Admitting: Family Medicine

## 2015-04-26 ENCOUNTER — Telehealth: Payer: Self-pay | Admitting: Family Medicine

## 2015-04-26 VITALS — BP 128/78 | HR 88 | Temp 98.6°F | Resp 18

## 2015-04-26 DIAGNOSIS — M79671 Pain in right foot: Secondary | ICD-10-CM | POA: Insufficient documentation

## 2015-04-26 DIAGNOSIS — M79604 Pain in right leg: Secondary | ICD-10-CM | POA: Diagnosis not present

## 2015-04-26 DIAGNOSIS — S9031XA Contusion of right foot, initial encounter: Secondary | ICD-10-CM

## 2015-04-26 NOTE — Telephone Encounter (Signed)
X Ray results have not came back yet. Will call patient when results come in.

## 2015-04-26 NOTE — Progress Notes (Signed)
Patient ID: Susan House, female   DOB: Jul 19, 1955, 59 y.o.   MRN: [id number] , Name: Susan House   MRN: [id number]    DOB: 1956-02-29   Date:04/26/2015       Progress Note  Subjective  Chief Complaint  Chief Complaint  Patient presents with  . Foot Pain    right foot pain X 1 day    Foot Pain This is a new problem. The current episode started yesterday. The problem occurs constantly. The problem has been gradually worsening. Associated symptoms include joint swelling.  Was coming out of her walk-in tub and slipped the right foot under the door of the tub yesterday evening. Swelling, pain and bruising noted today with an abrasion on the top of the foot. Did not fall. Last Tdap was in 2012. Pain to walk or stand. Used an ice pack immediately with rest over last night.   Past Medical History  Diagnosis Date  . Cellulitis and abscess of face   . Allergy   . Depression   . Hypertension   . Edema   . Arthritis   . Migraine   . GERD (gastroesophageal reflux disease)   . Stroke ([place])   . Diabetes mellitus without complication ([place])   . Hyperlipidemia   . Obesity   . IBS (irritable bowel syndrome)   . Urinary incontinence     Social History  Substance Use Topics  . Smoking status: Never Smoker   . Smokeless tobacco: Never Used  . Alcohol Use: No    Current outpatient prescriptions:  .  amLODipine (NORVASC) 5 MG tablet, Take 5 mg by mouth daily., Disp: , Rfl:  .  amoxicillin (AMOXIL) 875 MG tablet, Take 1 tablet (875 mg total) by mouth 2 (two) times daily., Disp: 20 tablet, Rfl: 0 .  aspirin 325 MG tablet, Take 325 mg by mouth daily., Disp: , Rfl:  .  atorvastatin (LIPITOR) 20 MG tablet, Take 20 mg by mouth daily., Disp: , Rfl:  .  clopidogrel (PLAVIX) 75 MG tablet, Take 1 tablet (75 mg total) by mouth daily., Disp: 90 tablet, Rfl: 3 .  enalapril (VASOTEC) 20 MG tablet, Take 20 mg by mouth 2 (two) times daily., Disp: , Rfl:  .  glucose blood test strip, 1 each by Other  route daily. Use as instructed, Disp: , Rfl:  .  hydrochlorothiazide (HYDRODIURIL) 25 MG tablet, Take 25 mg by mouth daily., Disp: , Rfl:  .  HYDROmorphone (DILAUDID) 2 MG tablet, 1-2 every 6 hours PRN, Disp: 45 tablet, Rfl: 0 .  insulin aspart protamine- aspart (NOVOLOG MIX 70/30) (70-30) 100 UNIT/ML injection, Inject 12-15 Units into the skin 2 (two) times daily., Disp: , Rfl:  .  insulin NPH-regular (NOVOLIN 70/30) (70-30) 100 UNIT/ML injection, Inject 15 Units into the skin 2 (two) times daily., Disp: , Rfl:  .  ketorolac (TORADOL) 10 MG tablet, Take 1 tablet (10 mg total) by mouth every 8 (eight) hours., Disp: 15 tablet, Rfl: 0 .  metFORMIN (GLUCOPHAGE) 1000 MG tablet, Take 1,000 mg by mouth 2 (two) times daily with a meal., Disp: , Rfl:  .  nitroGLYCERIN (NITROSTAT) 0.4 MG SL tablet, Place 0.4 mg under the tongue every 5 (five) minutes as needed for chest pain., Disp: , Rfl:  .  oxybutynin (DITROPAN-XL) 5 MG 24 hr tablet, Take 5 mg by mouth 4 (four) times daily., Disp: , Rfl:   Allergies  Allergen Reactions  . Morphine And Related Anaphylaxis  . Betadine [Povidone  Iodine]   . Fentanyl   . Iodine   . Morphine And Related   . Reglan [Metoclopramide]   . Simvastatin     Muscle pain  . Betadine [Povidone Iodine] Rash  . Tetracyclines & Related Rash  . Tetracyclines & Related Rash   Review of Systems  Constitutional: Negative.   HENT: Negative.   Eyes: Negative.   Respiratory: Negative.   Cardiovascular: Negative.   Gastrointestinal: Negative.   Genitourinary: Negative.   Musculoskeletal: Positive for joint pain and joint swelling.  Skin: Negative.   Neurological: Negative.   Endo/Heme/Allergies: Negative.   Psychiatric/Behavioral: Negative.    Objective  Filed Vitals:   04/26/15 1029  BP: 128/78  Pulse: 88  Temp: 98.6 F (37 C)  TempSrc: Oral  Resp: 18  SpO2: 98%   Physical Exam  Constitutional: She is oriented to person, place, and time and well-developed,  well-nourished, and in no distress.  Musculoskeletal: She exhibits edema and tenderness.  Ecchymosis and swelling of the right forefoot. Pain increased to apply pressure to the plantar or dorsal surface of the second metatarsal. Good pulses. Denies numbness.  Neurological: She is alert and oriented to person, place, and time.  Skin:  Couple of small abrasions to the top of the right foot.    Recent Results (from the past 2160 hour(s))  Urine culture     Status: None   Collection Time: 02/19/15 12:00 AM  Result Value Ref Range   Urine Culture, Routine Final report    Urine Culture result 1 Comment     Comment: Culture shows less than 10,000 colony forming units of bacteria per milliliter of urine. This colony count is not generally considered to be clinically significant.   POCT urinalysis dipstick     Status: Abnormal   Collection Time: 02/19/15 11:54 AM  Result Value Ref Range   Color, UA dark yellow    Clarity, UA cloudy    Glucose, UA 100    Bilirubin, UA small    Ketones, UA small    Spec Grav, UA >=1.030    Blood, UA moderate    pH, UA 5.0    Protein, UA 2000     Comment: ++++   Urobilinogen, UA 0.2    Nitrite, UA negative    Leukocytes, UA moderate (2+) (A) Negative  POCT urinalysis dipstick     Status: Abnormal   Collection Time: 03/25/15  3:59 PM  Result Value Ref Range   Color, UA brownish color    Clarity, UA cloudy    Glucose, UA 2000    Bilirubin, UA negative    Ketones, UA negative    Spec Grav, UA 1.020    Blood, UA moderate hemolyzide    pH, UA 5.0    Protein, UA 2+    Urobilinogen, UA 0.2    Nitrite, UA positive    Leukocytes, UA moderate (2+) (A) Negative  Urine culture     Status: Abnormal   Collection Time: 03/26/15 12:00 AM  Result Value Ref Range   Urine Culture, Routine Final report (A)    Urine Culture result 1 Proteus mirabilis (A)     Comment: Greater than 100,000 colony forming units per mL   RESULT 2 Comment     Comment: Mixed  urogenital flora 10,000-25,000 colony forming units per mL    ANTIMICROBIAL SUSCEPTIBILITY Comment     Comment:       ** S = Susceptible; I = Intermediate; R = Resistant **  P = Positive; N = Negative             MICS are expressed in micrograms per mL    Antibiotic                 RSLT#1    RSLT#2    RSLT#3    RSLT#4 Amoxicillin/Clavulanic Acid    S Ampicillin                     S Cefepime                       S Ceftriaxone                    S Cefuroxime                     S Cephalothin                    S Ciprofloxacin                  S Ertapenem                      S Gentamicin                     S Levofloxacin                   S Nitrofurantoin                 R Piperacillin                   S Tetracycline                   R Tobramycin                     S Trimethoprim/Sulfa             S      Assessment & Plan  1. Pain of right lower extremity Onset yesterday evening while getting out of her walk-in tub. Very bruised and swollen. Will get x-ray evaluation to rule out fracture. - DG Foot Complete Right  2. Contusion of right foot, initial encounter Onset yesterday when she slipped getting out of her walk-in tub scraping the foot on the bottom to the tub door. Continue elevation and apply band-aid to abrasion. Tdap up to date (2012). Recheck pending x-ray report. Advised to watch for signs of infection.

## 2015-04-26 NOTE — Telephone Encounter (Signed)
Pt would like results of her x-ray results called to [phone].

## 2015-04-27 ENCOUNTER — Telehealth: Payer: Self-pay

## 2015-04-27 NOTE — Telephone Encounter (Signed)
Patient advised as directed below. Patient verbalized understanding and agrees with plan of care.  

## 2015-04-27 NOTE — Telephone Encounter (Signed)
Attempted to contact patient. No answer nor voicemail.  

## 2015-04-27 NOTE — Telephone Encounter (Signed)
-----   Message from Margo Common, [place] sent at 04/26/2015  5:39 PM EDT ----- No sign of fractured bones in the right foot. Seems to be a severe contusion with the abrasion. May use Tylenol or Aleve as needed and elevate foot as often as possible. Recheck as needed.

## 2015-04-27 NOTE — Telephone Encounter (Signed)
Pt is returning call.  CB#[phone]/MW °

## 2015-04-27 NOTE — Telephone Encounter (Signed)
X Ray results back. See lab result message.  Attempted to contact patient. No answer nor voicemail.

## 2015-05-03 ENCOUNTER — Encounter: Payer: Self-pay | Admitting: Family Medicine

## 2015-05-03 ENCOUNTER — Ambulatory Visit (INDEPENDENT_AMBULATORY_CARE_PROVIDER_SITE_OTHER): Payer: Commercial Managed Care - HMO | Admitting: Family Medicine

## 2015-05-03 VITALS — BP 146/80 | HR 88 | Temp 97.6°F | Resp 16

## 2015-05-03 DIAGNOSIS — L03115 Cellulitis of right lower limb: Secondary | ICD-10-CM | POA: Diagnosis not present

## 2015-05-03 DIAGNOSIS — S9031XA Contusion of right foot, initial encounter: Secondary | ICD-10-CM | POA: Diagnosis not present

## 2015-05-03 MED ORDER — SULFAMETHOXAZOLE-TRIMETHOPRIM 800-160 MG PO TABS: 1.0000 | ORAL_TABLET | Freq: Two times a day (BID) | ORAL | Status: DC
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
## 2015-05-06 ENCOUNTER — Ambulatory Visit (INDEPENDENT_AMBULATORY_CARE_PROVIDER_SITE_OTHER): Payer: Commercial Managed Care - HMO | Admitting: Family Medicine

## 2015-05-06 ENCOUNTER — Encounter: Payer: Self-pay | Admitting: Family Medicine

## 2015-05-06 VITALS — BP 146/76 | HR 76 | Temp 98.4°F | Resp 16 | Wt 277.0 lb

## 2015-05-06 DIAGNOSIS — I638 Other cerebral infarction: Secondary | ICD-10-CM | POA: Diagnosis not present

## 2015-05-06 DIAGNOSIS — R42 Dizziness and giddiness: Secondary | ICD-10-CM | POA: Diagnosis not present

## 2015-05-06 DIAGNOSIS — I6389 Other cerebral infarction: Secondary | ICD-10-CM

## 2015-05-06 DIAGNOSIS — L03115 Cellulitis of right lower limb: Secondary | ICD-10-CM

## 2015-05-06 DIAGNOSIS — R51 Headache: Secondary | ICD-10-CM

## 2015-05-06 DIAGNOSIS — R519 Headache, unspecified: Secondary | ICD-10-CM

## 2015-05-06 MED ORDER — MECLIZINE HCL 25 MG PO TABS: 25.0000 mg | ORAL_TABLET | Freq: Three times a day (TID) | ORAL | Status: DC | PRN
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
## 2015-05-10 ENCOUNTER — Encounter: Payer: Self-pay | Admitting: Family Medicine

## 2015-05-10 ENCOUNTER — Ambulatory Visit (INDEPENDENT_AMBULATORY_CARE_PROVIDER_SITE_OTHER): Payer: Commercial Managed Care - HMO | Admitting: Family Medicine

## 2015-05-10 VITALS — BP 124/80 | HR 92 | Temp 97.9°F | Resp 16

## 2015-05-10 DIAGNOSIS — Z794 Long term (current) use of insulin: Secondary | ICD-10-CM | POA: Diagnosis not present

## 2015-05-10 DIAGNOSIS — B373 Candidiasis of vulva and vagina: Secondary | ICD-10-CM | POA: Diagnosis not present

## 2015-05-10 DIAGNOSIS — E1142 Type 2 diabetes mellitus with diabetic polyneuropathy: Secondary | ICD-10-CM

## 2015-05-10 DIAGNOSIS — B3731 Acute candidiasis of vulva and vagina: Secondary | ICD-10-CM | POA: Insufficient documentation

## 2015-05-10 DIAGNOSIS — L0390 Cellulitis, unspecified: Secondary | ICD-10-CM | POA: Insufficient documentation

## 2015-05-10 DIAGNOSIS — L03818 Cellulitis of other sites: Secondary | ICD-10-CM | POA: Diagnosis not present

## 2015-05-10 MED ORDER — CEPHALEXIN 500 MG PO TABS: 500.0000 mg | ORAL_TABLET | Freq: Four times a day (QID) | ORAL | Status: DC

## 2015-05-10 MED ORDER — FLUCONAZOLE 150 MG PO TABS: 150.0000 mg | ORAL_TABLET | Freq: Once | ORAL | Status: DC
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
## 2015-05-14 ENCOUNTER — Other Ambulatory Visit: Payer: Self-pay | Admitting: Family Medicine

## 2015-06-15 ENCOUNTER — Other Ambulatory Visit: Payer: Self-pay

## 2015-06-15 MED ORDER — MOMETASONE FUROATE 0.1 % EX CREA: 1.0000 "application " | TOPICAL_CREAM | Freq: Every day | CUTANEOUS | Status: DC

## 2015-06-24 ENCOUNTER — Telehealth: Payer: Self-pay | Admitting: Family Medicine

## 2015-06-28 ENCOUNTER — Ambulatory Visit: Payer: Commercial Managed Care - HMO | Admitting: Cardiovascular Disease

## 2015-07-01 ENCOUNTER — Ambulatory Visit (INDEPENDENT_AMBULATORY_CARE_PROVIDER_SITE_OTHER): Payer: Commercial Managed Care - HMO | Admitting: Family Medicine

## 2015-07-01 ENCOUNTER — Other Ambulatory Visit: Payer: Self-pay | Admitting: Family Medicine

## 2015-07-01 VITALS — BP 152/80 | HR 82 | Temp 97.7°F | Resp 16 | Wt 268.0 lb

## 2015-07-01 DIAGNOSIS — N390 Urinary tract infection, site not specified: Secondary | ICD-10-CM | POA: Diagnosis not present

## 2015-07-01 DIAGNOSIS — R300 Dysuria: Secondary | ICD-10-CM

## 2015-07-01 DIAGNOSIS — J329 Chronic sinusitis, unspecified: Secondary | ICD-10-CM | POA: Diagnosis not present

## 2015-07-01 DIAGNOSIS — R319 Hematuria, unspecified: Secondary | ICD-10-CM | POA: Diagnosis not present

## 2015-07-01 LAB — POCT URINALYSIS DIPSTICK
BILIRUBIN UA: NEGATIVE
Glucose, UA: NEGATIVE
KETONES UA: NEGATIVE
Nitrite, UA: NEGATIVE
PROTEIN UA: NEGATIVE
Spec Grav, UA: 1.025
Urobilinogen, UA: 0.2
pH, UA: 6

## 2015-07-01 MED ORDER — LEVOFLOXACIN 500 MG PO TABS: 500.0000 mg | ORAL_TABLET | Freq: Every day | ORAL | Status: DC
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
## 2015-07-02 LAB — URINE CULTURE

## 2015-07-20 ENCOUNTER — Telehealth: Payer: Self-pay | Admitting: Family Medicine

## 2015-07-20 ENCOUNTER — Encounter: Payer: Self-pay | Admitting: Family Medicine

## 2015-07-20 ENCOUNTER — Ambulatory Visit (INDEPENDENT_AMBULATORY_CARE_PROVIDER_SITE_OTHER): Payer: Commercial Managed Care - HMO | Admitting: Family Medicine

## 2015-07-20 VITALS — BP 148/72 | HR 105 | Resp 18

## 2015-07-20 DIAGNOSIS — N390 Urinary tract infection, site not specified: Secondary | ICD-10-CM

## 2015-07-20 DIAGNOSIS — N3090 Cystitis, unspecified without hematuria: Secondary | ICD-10-CM | POA: Diagnosis not present

## 2015-07-20 DIAGNOSIS — L03818 Cellulitis of other sites: Secondary | ICD-10-CM

## 2015-07-20 DIAGNOSIS — R32 Unspecified urinary incontinence: Secondary | ICD-10-CM

## 2015-07-20 DIAGNOSIS — J329 Chronic sinusitis, unspecified: Secondary | ICD-10-CM

## 2015-07-20 MED ORDER — OXYBUTYNIN CHLORIDE ER 5 MG PO TB24: 5.0000 mg | ORAL_TABLET | Freq: Every day | ORAL | Status: DC

## 2015-07-20 MED ORDER — AMOXICILLIN-POT CLAVULANATE 875-125 MG PO TABS: 1.0000 | ORAL_TABLET | Freq: Two times a day (BID) | ORAL | Status: DC

## 2015-07-20 NOTE — Telephone Encounter (Signed)
Pt calling saying [facility] called telling her they need an ok on her  oxybutynin (DITROPAN-XL) 5 MG 24 hr tablet  Taking -- -- Historical Provider, MD   Pt's call back [phone]     Thanks Con Memos

## 2015-07-20 NOTE — Telephone Encounter (Signed)
Ok to refill fort 1 year.

## 2015-07-20 NOTE — Telephone Encounter (Signed)
Med sent to pharmacy. Pt informed.  

## 2015-07-20 NOTE — Telephone Encounter (Signed)
Is thi ok to fill?-aa
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
## 2015-07-21 ENCOUNTER — Other Ambulatory Visit: Payer: Self-pay

## 2015-07-21 MED ORDER — GLUCOSE BLOOD VI STRP: 1.0000 | ORAL_STRIP | Freq: Every day | Status: DC

## 2015-07-22 ENCOUNTER — Observation Stay (HOSPITAL_BASED_OUTPATIENT_CLINIC_OR_DEPARTMENT_OTHER)
Admit: 2015-07-22 | Discharge: 2015-07-22 | Disposition: A | Discharge status: Home / Self Care | Payer: Commercial Managed Care - HMO | Attending: Cardiovascular Disease | Admitting: Cardiovascular Disease

## 2015-07-22 ENCOUNTER — Observation Stay
Admission: EM | Admit: 2015-07-22 | Discharge: 2015-07-23 | Disposition: A | Discharge status: Home / Self Care | Payer: Commercial Managed Care - HMO | Attending: Internal Medicine | Admitting: Internal Medicine

## 2015-07-22 ENCOUNTER — Encounter: Payer: Self-pay | Admitting: Emergency Medicine

## 2015-07-22 ENCOUNTER — Emergency Department: Payer: Commercial Managed Care - HMO

## 2015-07-22 DIAGNOSIS — Z794 Long term (current) use of insulin: Secondary | ICD-10-CM | POA: Diagnosis not present

## 2015-07-22 DIAGNOSIS — Z87440 Personal history of urinary (tract) infections: Secondary | ICD-10-CM | POA: Insufficient documentation

## 2015-07-22 DIAGNOSIS — E785 Hyperlipidemia, unspecified: Secondary | ICD-10-CM | POA: Diagnosis not present

## 2015-07-22 DIAGNOSIS — Z833 Family history of diabetes mellitus: Secondary | ICD-10-CM | POA: Insufficient documentation

## 2015-07-22 DIAGNOSIS — G2581 Restless legs syndrome: Secondary | ICD-10-CM | POA: Insufficient documentation

## 2015-07-22 DIAGNOSIS — R531 Weakness: Secondary | ICD-10-CM | POA: Insufficient documentation

## 2015-07-22 DIAGNOSIS — Z9049 Acquired absence of other specified parts of digestive tract: Secondary | ICD-10-CM | POA: Insufficient documentation

## 2015-07-22 DIAGNOSIS — I10 Essential (primary) hypertension: Secondary | ICD-10-CM | POA: Diagnosis present

## 2015-07-22 DIAGNOSIS — E669 Obesity, unspecified: Secondary | ICD-10-CM | POA: Diagnosis not present

## 2015-07-22 DIAGNOSIS — Z981 Arthrodesis status: Secondary | ICD-10-CM | POA: Diagnosis not present

## 2015-07-22 DIAGNOSIS — E1129 Type 2 diabetes mellitus with other diabetic kidney complication: Secondary | ICD-10-CM | POA: Diagnosis not present

## 2015-07-22 DIAGNOSIS — R42 Dizziness and giddiness: Secondary | ICD-10-CM | POA: Diagnosis not present

## 2015-07-22 DIAGNOSIS — Z8673 Personal history of transient ischemic attack (TIA), and cerebral infarction without residual deficits: Secondary | ICD-10-CM | POA: Diagnosis not present

## 2015-07-22 DIAGNOSIS — E119 Type 2 diabetes mellitus without complications: Secondary | ICD-10-CM | POA: Diagnosis not present

## 2015-07-22 DIAGNOSIS — K219 Gastro-esophageal reflux disease without esophagitis: Secondary | ICD-10-CM | POA: Diagnosis not present

## 2015-07-22 DIAGNOSIS — F4321 Adjustment disorder with depressed mood: Secondary | ICD-10-CM

## 2015-07-22 DIAGNOSIS — R21 Rash and other nonspecific skin eruption: Secondary | ICD-10-CM | POA: Diagnosis not present

## 2015-07-22 DIAGNOSIS — E1122 Type 2 diabetes mellitus with diabetic chronic kidney disease: Secondary | ICD-10-CM

## 2015-07-22 DIAGNOSIS — I739 Peripheral vascular disease, unspecified: Secondary | ICD-10-CM | POA: Diagnosis not present

## 2015-07-22 DIAGNOSIS — I6529 Occlusion and stenosis of unspecified carotid artery: Secondary | ICD-10-CM | POA: Insufficient documentation

## 2015-07-22 DIAGNOSIS — R079 Chest pain, unspecified: Secondary | ICD-10-CM | POA: Diagnosis present

## 2015-07-22 DIAGNOSIS — R0789 Other chest pain: Principal | ICD-10-CM | POA: Insufficient documentation

## 2015-07-22 DIAGNOSIS — N390 Urinary tract infection, site not specified: Secondary | ICD-10-CM | POA: Insufficient documentation

## 2015-07-22 DIAGNOSIS — Z9889 Other specified postprocedural states: Secondary | ICD-10-CM | POA: Diagnosis not present

## 2015-07-22 DIAGNOSIS — Z90710 Acquired absence of both cervix and uterus: Secondary | ICD-10-CM | POA: Insufficient documentation

## 2015-07-22 DIAGNOSIS — Z7982 Long term (current) use of aspirin: Secondary | ICD-10-CM | POA: Insufficient documentation

## 2015-07-22 DIAGNOSIS — Z8349 Family history of other endocrine, nutritional and metabolic diseases: Secondary | ICD-10-CM | POA: Insufficient documentation

## 2015-07-22 DIAGNOSIS — N183 Chronic kidney disease, stage 3 (moderate): Secondary | ICD-10-CM

## 2015-07-22 DIAGNOSIS — Z9849 Cataract extraction status, unspecified eye: Secondary | ICD-10-CM | POA: Insufficient documentation

## 2015-07-22 DIAGNOSIS — Z8249 Family history of ischemic heart disease and other diseases of the circulatory system: Secondary | ICD-10-CM | POA: Diagnosis not present

## 2015-07-22 DIAGNOSIS — E876 Hypokalemia: Secondary | ICD-10-CM | POA: Diagnosis present

## 2015-07-22 DIAGNOSIS — R0602 Shortness of breath: Secondary | ICD-10-CM | POA: Diagnosis not present

## 2015-07-22 DIAGNOSIS — F329 Major depressive disorder, single episode, unspecified: Secondary | ICD-10-CM | POA: Diagnosis not present

## 2015-07-22 DIAGNOSIS — I209 Angina pectoris, unspecified: Secondary | ICD-10-CM | POA: Insufficient documentation

## 2015-07-22 DIAGNOSIS — E860 Dehydration: Secondary | ICD-10-CM | POA: Insufficient documentation

## 2015-07-22 DIAGNOSIS — F4320 Adjustment disorder, unspecified: Secondary | ICD-10-CM | POA: Insufficient documentation

## 2015-07-22 HISTORY — DX: Morbid (severe) obesity due to excess calories: E66.01

## 2015-07-22 HISTORY — DX: Type 2 diabetes mellitus without complications: E11.9

## 2015-07-22 HISTORY — DX: Transient cerebral ischemic attack, unspecified: G45.9

## 2015-07-22 LAB — TROPONIN I: TROPONIN I: 0.03 ng/mL (ref <0.031)

## 2015-07-22 LAB — CBC
HCT: 36.5 % (ref 35.0–47.0)
HEMOGLOBIN: 11.9 g/dL — AB (ref 12.0–16.0)
MCH: 27.8 pg (ref 26.0–34.0)
MCHC: 32.6 g/dL (ref 32.0–36.0)
MCV: 85.3 fL (ref 80.0–100.0)
Platelets: 232 10*3/uL (ref 150–440)
RBC: 4.28 MIL/uL (ref 3.80–5.20)
RDW: 15.6 % — ABNORMAL HIGH (ref 11.5–14.5)
WBC: 9.9 10*3/uL (ref 3.6–11.0)

## 2015-07-22 LAB — URINE CULTURE

## 2015-07-22 LAB — BASIC METABOLIC PANEL
ANION GAP: 11 (ref 5–15)
BUN: 40 mg/dL — ABNORMAL HIGH (ref 6–20)
CALCIUM: 8.6 mg/dL — AB (ref 8.9–10.3)
CHLORIDE: 104 mmol/L (ref 101–111)
CO2: 23 mmol/L (ref 22–32)
Creatinine, Ser: 1.64 mg/dL — ABNORMAL HIGH (ref 0.44–1.00)
GFR calc non Af Amer: 33 mL/min — ABNORMAL LOW (ref >60)
GFR, EST AFRICAN AMERICAN: 39 mL/min — AB (ref >60)
Glucose, Bld: 308 mg/dL — ABNORMAL HIGH (ref 65–99)
Potassium: 2.9 mmol/L — CL (ref 3.5–5.1)
Sodium: 138 mmol/L (ref 135–145)

## 2015-07-22 LAB — GLUCOSE, CAPILLARY
GLUCOSE-CAPILLARY: 94 mg/dL (ref 65–99)
Glucose-Capillary: 234 mg/dL — ABNORMAL HIGH (ref 65–99)
Glucose-Capillary: 154 mg/dL — ABNORMAL HIGH (ref 65–99)
Glucose-Capillary: 75 mg/dL (ref 65–99)

## 2015-07-22 LAB — MAGNESIUM: MAGNESIUM: 1.4 mg/dL — AB (ref 1.7–2.4)

## 2015-07-22 LAB — POTASSIUM: Potassium: 3.6 mmol/L (ref 3.5–5.1)

## 2015-07-22 MED ORDER — ENOXAPARIN SODIUM 40 MG/0.4ML ~~LOC~~ SOLN
40.0000 mg | SUBCUTANEOUS | Status: DC
  Administered 2015-07-22: 40 mg via SUBCUTANEOUS
  Filled 2015-07-22: qty 0.4

## 2015-07-22 MED ORDER — ASPIRIN 81 MG PO CHEW
81.0000 mg | CHEWABLE_TABLET | Freq: Every day | ORAL | Status: DC
  Administered 2015-07-23: 81 mg via ORAL
  Filled 2015-07-22: qty 1

## 2015-07-22 MED ORDER — AMLODIPINE BESYLATE 5 MG PO TABS
5.0000 mg | ORAL_TABLET | Freq: Every day | ORAL | Status: DC
  Administered 2015-07-22 – 2015-07-23 (×2): 5 mg via ORAL
  Filled 2015-07-22 (×2): qty 1

## 2015-07-22 MED ORDER — LEVOFLOXACIN 500 MG PO TABS
500.0000 mg | ORAL_TABLET | Freq: Every day | ORAL | Status: DC
  Administered 2015-07-22: 500 mg via ORAL
  Filled 2015-07-22: qty 1

## 2015-07-22 MED ORDER — HYDROCODONE-ACETAMINOPHEN 5-325 MG PO TABS
2.0000 | ORAL_TABLET | Freq: Once | ORAL | Status: AC
  Administered 2015-07-22: 2 via ORAL
  Filled 2015-07-22: qty 2

## 2015-07-22 MED ORDER — POTASSIUM CHLORIDE CRYS ER 20 MEQ PO TBCR
40.0000 meq | EXTENDED_RELEASE_TABLET | Freq: Once | ORAL | Status: AC
  Administered 2015-07-22: 40 meq via ORAL
  Filled 2015-07-22: qty 2

## 2015-07-22 MED ORDER — ASPIRIN 300 MG RE SUPP: 300.0000 mg | RECTAL | Status: DC

## 2015-07-22 MED ORDER — POTASSIUM CHLORIDE 10 MEQ/100ML IV SOLN
10.0000 meq | INTRAVENOUS | Status: AC
  Administered 2015-07-22: 10 meq via INTRAVENOUS
  Filled 2015-07-22 (×2): qty 100

## 2015-07-22 MED ORDER — ONDANSETRON HCL 4 MG/2ML IJ SOLN: 4.0000 mg | Freq: Four times a day (QID) | INTRAMUSCULAR | Status: DC | PRN

## 2015-07-22 MED ORDER — PANTOPRAZOLE SODIUM 40 MG PO TBEC
40.0000 mg | DELAYED_RELEASE_TABLET | Freq: Every day | ORAL | Status: DC
  Administered 2015-07-22 – 2015-07-23 (×2): 40 mg via ORAL
  Filled 2015-07-22 (×2): qty 1

## 2015-07-22 MED ORDER — INSULIN ASPART 100 UNIT/ML ~~LOC~~ SOLN: 0.0000 [IU] | Freq: Every day | SUBCUTANEOUS | Status: DC

## 2015-07-22 MED ORDER — LORAZEPAM 2 MG/ML IJ SOLN
0.5000 mg | Freq: Once | INTRAMUSCULAR | Status: AC
  Administered 2015-07-22: 0.5 mg via INTRAVENOUS
  Filled 2015-07-22: qty 1

## 2015-07-22 MED ORDER — OXYBUTYNIN CHLORIDE ER 5 MG PO TB24
5.0000 mg | ORAL_TABLET | Freq: Every day | ORAL | Status: DC
  Administered 2015-07-22 – 2015-07-23 (×2): 5 mg via ORAL
  Filled 2015-07-22 (×2): qty 1

## 2015-07-22 MED ORDER — NITROGLYCERIN 0.4 MG SL SUBL: 0.4000 mg | SUBLINGUAL_TABLET | SUBLINGUAL | Status: DC | PRN

## 2015-07-22 MED ORDER — BACLOFEN 10 MG PO TABS
20.0000 mg | ORAL_TABLET | Freq: Two times a day (BID) | ORAL | Status: DC
  Administered 2015-07-22 – 2015-07-23 (×3): 20 mg via ORAL
  Filled 2015-07-22 (×3): qty 2

## 2015-07-22 MED ORDER — SODIUM CHLORIDE 0.9 % IV SOLN: INTRAVENOUS | Status: DC

## 2015-07-22 MED ORDER — SODIUM CHLORIDE 0.9 % IV SOLN
INTRAVENOUS | Status: DC
  Administered 2015-07-22: 19:00:00 via INTRAVENOUS

## 2015-07-22 MED ORDER — CLOPIDOGREL BISULFATE 75 MG PO TABS
75.0000 mg | ORAL_TABLET | Freq: Every day | ORAL | Status: DC
  Administered 2015-07-22 – 2015-07-23 (×2): 75 mg via ORAL
  Filled 2015-07-22 (×2): qty 1

## 2015-07-22 MED ORDER — MAGNESIUM OXIDE 400 (241.3 MG) MG PO TABS
400.0000 mg | ORAL_TABLET | Freq: Two times a day (BID) | ORAL | Status: DC
  Administered 2015-07-22 – 2015-07-23 (×3): 400 mg via ORAL
  Filled 2015-07-22 (×3): qty 1

## 2015-07-22 MED ORDER — NITROGLYCERIN 0.4 MG SL SUBL
0.4000 mg | SUBLINGUAL_TABLET | SUBLINGUAL | Status: DC | PRN
  Administered 2015-07-22: 0.4 mg via SUBLINGUAL
  Filled 2015-07-22: qty 1

## 2015-07-22 MED ORDER — SODIUM CHLORIDE 0.9 % IV BOLUS (SEPSIS)
500.0000 mL | Freq: Once | INTRAVENOUS | Status: AC
  Administered 2015-07-22: 500 mL via INTRAVENOUS

## 2015-07-22 MED ORDER — ACETAMINOPHEN 325 MG PO TABS: 650.0000 mg | ORAL_TABLET | ORAL | Status: DC | PRN

## 2015-07-22 MED ORDER — ASPIRIN 325 MG PO TABS
325.0000 mg | ORAL_TABLET | Freq: Every day | ORAL | Status: DC
  Filled 2015-07-22: qty 1

## 2015-07-22 MED ORDER — ENALAPRIL MALEATE 10 MG PO TABS
20.0000 mg | ORAL_TABLET | Freq: Two times a day (BID) | ORAL | Status: DC
  Administered 2015-07-22 – 2015-07-23 (×3): 20 mg via ORAL
  Filled 2015-07-22 (×3): qty 2

## 2015-07-22 MED ORDER — NITROGLYCERIN 2 % TD OINT
1.0000 [in_us] | TOPICAL_OINTMENT | Freq: Four times a day (QID) | TRANSDERMAL | Status: DC
  Filled 2015-07-22: qty 1

## 2015-07-22 MED ORDER — INSULIN ASPART PROT & ASPART (70-30 MIX) 100 UNIT/ML ~~LOC~~ SUSP
25.0000 [IU] | Freq: Two times a day (BID) | SUBCUTANEOUS | Status: DC
  Administered 2015-07-22 – 2015-07-23 (×2): 25 [IU] via SUBCUTANEOUS
  Filled 2015-07-22 (×2): qty 25

## 2015-07-22 MED ORDER — ASPIRIN EC 81 MG PO TBEC: 81.0000 mg | DELAYED_RELEASE_TABLET | Freq: Every day | ORAL | Status: DC

## 2015-07-22 MED ORDER — INSULIN ASPART 100 UNIT/ML ~~LOC~~ SOLN
0.0000 [IU] | Freq: Three times a day (TID) | SUBCUTANEOUS | Status: DC
  Administered 2015-07-22: 5 [IU] via SUBCUTANEOUS
  Administered 2015-07-22: 3 [IU] via SUBCUTANEOUS
  Administered 2015-07-23: 2 [IU] via SUBCUTANEOUS
  Filled 2015-07-22: qty 5
  Filled 2015-07-22: qty 3
  Filled 2015-07-22: qty 2

## 2015-07-22 MED ORDER — ASPIRIN 81 MG PO CHEW: 324.0000 mg | CHEWABLE_TABLET | ORAL | Status: DC

## 2015-07-22 MED ORDER — INSULIN ASPART PROT & ASPART (70-30 MIX) 100 UNIT/ML ~~LOC~~ SUSP
25.0000 [IU] | Freq: Two times a day (BID) | SUBCUTANEOUS | Status: DC
  Administered 2015-07-22: 25 [IU] via SUBCUTANEOUS
  Filled 2015-07-22: qty 25
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
## 2015-07-22 NOTE — Care Management (Signed)
Patient place in observation to evaluate chest pain.  Cariology has consult.  Echo performed and Lexiscan ordered.

## 2015-07-22 NOTE — ED Notes (Signed)
pt woke up at 0200with chest pain reports took 3 of her husbands NTG prior to calling EMS who gave an additional 3 NTG and placed 1" NTG paste to left chest wall, and 324 ASA. PT is currently pain free

## 2015-07-22 NOTE — Progress Notes (Signed)
*  PRELIMINARY RESULTS* Echocardiogram 2D Echocardiogram has been performed.  Susan House Stills 07/22/2015, 4:56 PM

## 2015-07-22 NOTE — Consult Note (Signed)
Cardiology Consultation Note  Patient ID: Susan House, MRN: [id number], DOB/AGE: 08-30-1955 60 y.o. Admit date: 07/22/2015   Date of Consult: 07/22/2015 Primary Physician: Wilhemena Durie, MD Primary Cardiologist: Dr. Rockey Situ, MD  Chief Complaint: Chest pain Reason for Consult: Chest pain  HPI: 60 year old female with a history of stroke in 2000 followed by posterior circulatory TIA in June of 2010, on Plavix and aspirin, chronic dizziness in the past secondary to dehydration, restless leg syndrome, type 2 diabetes, chronic UTIs, who presents for recent episodes of chest pain.  She has had significant cardiac workup in the past for chest pain including numerous echocardiograms dating back to 2008 showing normal LV systolic function, and per the patient she's had a cardiac catheterization. Results of the latter is not available at this time, though has had a negative Lexiscan Myoview in 12/2009 in the setting of chest after ruling out. Echo showed normal LV systolic function. Chest pain admission in December 2015, ruled out. Nor further work up. She reports having some arrhythmia in early January 2016 in the setting of infection of her toe. Since her toe infection has resolved, she's had no further palpitations. She was treated with Augmentin. No EKG documenting any arrhythmia.  Prior carotid ultrasound 2008 showing mild to moderate carotid arterial disease. Continues to have chronic dizziness, typically when she stands Mouth is always dry, she attributes this to the oxybutynin  Lab work 2015 showing total cholesterol 132, LDL 67 EKG November 2014 showing rare PVC  She presented to [facility] on 2022-07-27 with nonexertional chest pain that developed around 1 or 2 AM. SHe has been under a tremendous amount of stress lately with her mother being in the hospital at [facility] and her mother ultimately passed away on 2022-07-27. Patient is extremely upset about this. Patient has associated SOB and fatigue. No associated  nausea, vomiting, diaphoresis, palpitations, presyncope or syncope. She has not had any exertional symptoms.   Upon the [facility]'s [facility] they were found to have troponin negative x 2, K+ 2.9 s/p IV potassium, KCl 40 meq x 1 and 10 meq x 1, Mg++ 1.4, hgb 11.9. ECG as below, CXR showed no acute process. She has been without chest pain since taking SL NTG x 3 from her husband.     Past Medical History  Diagnosis Date  . Cellulitis and abscess of face   . Allergy   . Depression   . Hypertension   . Edema   . Arthritis   . Migraine   . GERD (gastroesophageal reflux disease)   . Stroke ([place])   . Diabetes mellitus without complication ([place])   . Hyperlipidemia   . Obesity   . IBS (irritable bowel syndrome)   . Urinary incontinence       Most Recent Cardiac Studies: As above   Surgical History:  Past Surgical History  Procedure Laterality Date  . Colonoscopy  2010  . Appendectomy    . Hernia repair    . Eye surgery    . Upper gi endoscopy    . Carpel tunnel surgery    . Back surgery      extensive spinal fusion  . Shoulder surgery    . Wrist fracture surgery Left   . Abdominoplasty    . Toe surgery    . Abdominal hysterectomy    . Cataract extraction      x 2  . Cholecystectomy    . Cesarean section  Home Meds: Prior to Admission medications   Medication Sig Start Date End Date Taking? Authorizing Provider  amLODipine (NORVASC) 5 MG tablet Take 5 mg by mouth daily.   Yes Historical Provider, MD  amoxicillin-clavulanate (AUGMENTIN) 875-125 MG tablet Take 1 tablet by mouth 2 (two) times daily. 07/20/15  Yes Richard Maceo Pro., MD  aspirin 325 MG tablet Take 325 mg by mouth daily.   Yes Historical Provider, MD  atorvastatin (LIPITOR) 20 MG tablet Take 20 mg by mouth daily.   Yes Historical Provider, MD  baclofen (LIORESAL) 20 MG tablet Take 20 mg by mouth 2 (two) times daily.   Yes Historical Provider, MD  clopidogrel (PLAVIX) 75 MG tablet Take 1 tablet (75  mg total) by mouth daily. 04/01/15  Yes Richard Maceo Pro., MD  enalapril (VASOTEC) 20 MG tablet Take 20 mg by mouth 2 (two) times daily.   Yes Historical Provider, MD  glucose blood test strip 1 each by Other route daily. Check sugar three times daily, DX:E11.9 07/21/15  Yes Richard Maceo Pro., MD  hydrochlorothiazide (HYDRODIURIL) 25 MG tablet Take 25 mg by mouth daily.   Yes Historical Provider, MD  insulin aspart protamine- aspart (NOVOLOG MIX 70/30) (70-30) 100 UNIT/ML injection Inject 25-30 Units into the skin 2 (two) times daily.    Yes Historical Provider, MD  metFORMIN (GLUCOPHAGE) 1000 MG tablet Take 1,000 mg by mouth 2 (two) times daily with a meal.   Yes Historical Provider, MD  nitroGLYCERIN (NITROSTAT) 0.4 MG SL tablet Place 0.4 mg under the tongue every 5 (five) minutes as needed for chest pain.   Yes Historical Provider, MD  oxybutynin (DITROPAN-XL) 5 MG 24 hr tablet Take 1 tablet (5 mg total) by mouth daily. 07/20/15  Yes Richard Maceo Pro., MD    Inpatient Medications:  . amLODipine  5 mg Oral Daily  . aspirin  324 mg Oral NOW   Or  . aspirin  300 mg Rectal NOW  . aspirin  325 mg Oral Daily  . baclofen  20 mg Oral BID  . clopidogrel  75 mg Oral Daily  . enalapril  20 mg Oral BID  . enoxaparin (LOVENOX) injection  40 mg Subcutaneous Q24H  . insulin aspart  0-15 Units Subcutaneous TID WC  . insulin aspart  0-5 Units Subcutaneous QHS  . insulin aspart protamine- aspart  25-30 Units Subcutaneous BID  . levofloxacin  500 mg Oral Daily  . nitroGLYCERIN  1 inch Topical 4 times per day  . oxybutynin  5 mg Oral Daily      Allergies:  Allergies  Allergen Reactions  . Morphine And Related Anaphylaxis  . Betadine [Povidone Iodine]   . Fentanyl   . Iodine   . Morphine And Related   . Reglan [Metoclopramide]   . Simvastatin     Muscle pain  . Betadine [Povidone Iodine] Rash  . Tetracyclines & Related Rash  . Tetracyclines & Related Rash    Social History    Social History  . Marital Status: Married    Spouse Name: Marcello Moores   . Number of Children: 2  . Years of Education: 15   Occupational History  . Disabled    . retired    Social History Main Topics  . Smoking status: Never Smoker   . Smokeless tobacco: Never Used  . Alcohol Use: No  . Drug Use: No  . Sexual Activity: No   Other Topics Concern  . Not on file  Social History Narrative   ** Merged History Encounter **       Patient lives at home with husband Marcello Moores.    Patient has 2 children and 2 step children.    Patient has 12+ years of education.    Patient is Disabled.      Family History  Problem Relation Age of Onset  . Hyperlipidemia Mother   . Arrhythmia Mother     WPW  . Heart attack Father 21  . Hyperlipidemia Father   . Hypertension Father   . Hypertension Mother   . Rheum arthritis Mother   . Heart disease Mother   . Stroke Father   . Diabetes Father   . Heart disease Father   . Coronary artery disease Father   . Diabetes Sister   . Hyperlipidemia Sister   . Hypertension Sister   . Depression Sister   . Depression Sister   . Diabetes Sister   . Hypertension Sister      Review of Systems: Review of Systems  Constitutional: Positive for malaise/fatigue. Negative for fever, chills, weight loss and diaphoresis.  HENT: Negative for congestion.   Eyes: Negative for discharge and redness.  Respiratory: Positive for shortness of breath. Negative for cough, hemoptysis, sputum production and wheezing.   Cardiovascular: Positive for chest pain. Negative for palpitations, orthopnea, claudication, leg swelling and PND.  Gastrointestinal: Negative for heartburn, nausea, vomiting and abdominal pain.  Musculoskeletal: Negative for falls.  Skin: Negative for rash.  Neurological: Positive for weakness. Negative for dizziness, sensory change, speech change, focal weakness and loss of consciousness.  Endo/Heme/Allergies: Does not bruise/bleed easily.   Psychiatric/Behavioral: The patient is not nervous/anxious.   All other systems reviewed and are negative.    Labs:  Recent Labs  07/22/15 0347 07/22/15 0944  TROPONINI <0.03 <0.03   Lab Results  Component Value Date   WBC 9.9 07/22/2015   HGB 11.9* 07/22/2015   HCT 36.5 07/22/2015   MCV 85.3 07/22/2015   PLT 232 07/22/2015    Recent Labs Lab 07/22/15 0347  NA 138  K 2.9*  CL 104  CO2 23  BUN 40*  CREATININE 1.64*  CALCIUM 8.6*  GLUCOSE 308*   Lab Results  Component Value Date   CHOL 110 01/18/2014   HDL 35* 01/18/2014   LDLCALC 51 01/18/2014   TRIG 122 01/18/2014   No results found for: DDIMER  Radiology/Studies:  Dg Chest 2 View  07/22/2015  CLINICAL DATA:  Acute onset of generalized chest pain. Initial encounter. EXAM: CHEST  2 VIEW COMPARISON:  Chest radiograph performed 06/17/2014 FINDINGS: The lungs are well-aerated and clear. There is no evidence of focal opacification, pleural effusion or pneumothorax. The heart is normal in size; the mediastinal contour is within normal limits. No acute osseous abnormalities are seen. There is chronic deformity involving the distal left clavicle, with underlying chronic displaced fracture. IMPRESSION: No acute cardiopulmonary process seen. Electronically Signed   By: Garald Balding M.D.   On: 07/22/2015 04:14    EKG: NSR, 97 bpm, early repolarization, nonspecific st/t changes    Weights: Filed Weights   07/22/15 0349 07/22/15 0744  Weight: 260 lb (117.935 kg) 269 lb 12.8 oz (122.38 kg)     Physical Exam: Blood pressure 106/48, pulse 79, temperature 97.9 F (36.6 C), temperature source Oral, resp. rate 18, height 5\' 1"  (1.549 m), weight 269 lb 12.8 oz (122.38 kg), SpO2 100 %. Body mass index is 51 kg/(m^2). General: Well developed, well nourished, in no  acute distress. Head: Normocephalic, atraumatic, sclera non-icteric, no xanthomas, nares are without discharge.  Neck: Negative for carotid bruits. JVD not  elevated. Lungs: Clear bilaterally to auscultation without wheezes, rales, or rhonchi. Breathing is unlabored. Heart: RRR with S1 S2. No murmurs, rubs, or gallops appreciated. Abdomen: Obese, soft, non-tender, non-distended with normoactive bowel sounds. No hepatomegaly. No rebound/guarding. No obvious abdominal masses. Msk:  Strength and tone appear normal for age. Extremities: No clubbing or cyanosis. No edema.  Distal pedal pulses are 2+ and equal bilaterally. Neuro: Alert and oriented X 3. No facial asymmetry. No focal deficit. Moves all extremities spontaneously. Psych:  Responds to questions appropriately with a normal affect.    Assessment and Plan:   1. Atypical chest pain: -Patient has been under a large amount of stress lately with her mother being hospitalized at [facility] and her passing away on 07/20/2014 -There is concern for Takotsubo cardiomyopathy -Check echo to evaluate LV function and wall motion -If normal echo she would need Lexiscan Myoview to evaluate for high risk ischemia -If echo demonstrates Taktosubo cardiomyopathy will need to treat medically and follow up with outpatient echo -Chest pain also possibly in the setting of spasm 2/2 hypokalemia and hypomagnesemia  2. Hypokalemia: -Replete to 4.0  3. Hypomagnesemia: -Replete to 2.0  4. HTN: -Per IM  5. History of stroke: -Aspirin and Plavix  6. PAD: -Needs outpatient follow up for carotid dupplex      SignedChristell Faith, PA-C Pager: [phone] 07/22/2015, 10:27 AM

## 2015-07-22 NOTE — Care Management Obs Status (Signed)
[facility] NOTIFICATION   Patient Details  Name: Susan House MRN: [id number] Date of Birth: Aug 09, 1955   Medicare Observation Status Notification Given:  Yes    Katrina Stack, RN 07/22/2015, 2:53 PM

## 2015-07-23 ENCOUNTER — Telehealth: Payer: Self-pay | Admitting: Family Medicine

## 2015-07-23 ENCOUNTER — Ambulatory Visit: Payer: Commercial Managed Care - HMO | Admitting: Cardiovascular Disease

## 2015-07-23 ENCOUNTER — Telehealth: Payer: Self-pay

## 2015-07-23 LAB — LIPID PANEL
CHOL/HDL RATIO: 3.4 ratio
CHOLESTEROL: 101 mg/dL (ref 0–200)
HDL: 30 mg/dL — AB (ref >40)
LDL Cholesterol: 49 mg/dL (ref 0–99)
TRIGLYCERIDES: 108 mg/dL (ref <150)
VLDL: 22 mg/dL (ref 0–40)

## 2015-07-23 LAB — MAGNESIUM: Magnesium: 1.7 mg/dL (ref 1.7–2.4)

## 2015-07-23 LAB — BASIC METABOLIC PANEL
ANION GAP: 8 (ref 5–15)
BUN: 33 mg/dL — ABNORMAL HIGH (ref 6–20)
CO2: 23 mmol/L (ref 22–32)
Calcium: 8.2 mg/dL — ABNORMAL LOW (ref 8.9–10.3)
Chloride: 110 mmol/L (ref 101–111)
Creatinine, Ser: 1.09 mg/dL — ABNORMAL HIGH (ref 0.44–1.00)
GFR, EST NON AFRICAN AMERICAN: 54 mL/min — AB (ref >60)
GLUCOSE: 119 mg/dL — AB (ref 65–99)
POTASSIUM: 3.5 mmol/L (ref 3.5–5.1)
SODIUM: 141 mmol/L (ref 135–145)

## 2015-07-23 LAB — GLUCOSE, CAPILLARY: GLUCOSE-CAPILLARY: 131 mg/dL — AB (ref 65–99)

## 2015-07-23 MED ORDER — MAGNESIUM OXIDE 400 (241.3 MG) MG PO TABS: 400.0000 mg | ORAL_TABLET | Freq: Every day | ORAL | Status: DC

## 2015-07-23 MED ORDER — PANTOPRAZOLE SODIUM 40 MG PO TBEC: 40.0000 mg | DELAYED_RELEASE_TABLET | Freq: Every day | ORAL | Status: DC

## 2015-07-23 MED ORDER — ENOXAPARIN SODIUM 40 MG/0.4ML ~~LOC~~ SOLN: 40.0000 mg | Freq: Two times a day (BID) | SUBCUTANEOUS | Status: DC

## 2015-07-23 NOTE — Progress Notes (Signed)
Pt heart rhythm normal sinus throughout night. Pt had 8 beat run of Vtach. Asymptomatic. Md notified. Acknowledged. No new orders placed. Will continue to monitor.

## 2015-07-23 NOTE — Telephone Encounter (Signed)
-----   Message from Jerrol Banana., MD sent at 07/23/2015  2:31 PM EST ----- Urine resistant to PCN but pt not having symptoms--if still asym,ptomatic with uring just push fluids and cranberry juice.

## 2015-07-23 NOTE — Progress Notes (Signed)
Enoxaparin 40 mg subcutaneously daily ordered for DVT prophylaxis. Order was changed to enoxaparin 40 mg subcutaneously BID per protocol due to CrCl > 30 mL/min and BMI > 40.  Darylene Price Yuridia Couts 11:47 AM

## 2015-07-23 NOTE — Progress Notes (Signed)
Discharge instructions explained to pt/ verbalized an understanding/ iv and tele removed/ will transport off unit via wheelchair.  

## 2015-07-23 NOTE — Progress Notes (Signed)
Patient: Susan House / Admit Date: 07/22/2015 / Date of Encounter: 07/23/2015, 10:28 AM   Subjective: No complaints overnight. Echo showed EF 55-60%, no regional WMA, GR1DD, calcified mitral annulus, mildly dilated LA, RV systolic function was normal, PASP normal. She has not had any further chest pain. Potassium is low normal at 3.5. Renal function has improved from 1.64-->1.09 with IV hydration.   Review of Systems: Review of Systems  Constitutional: Positive for malaise/fatigue. Negative for fever, chills, weight loss and diaphoresis.  HENT: Negative for congestion.   Eyes: Negative for discharge and redness.  Respiratory: Negative for cough, hemoptysis, sputum production, shortness of breath and wheezing.   Cardiovascular: Negative for chest pain, palpitations, orthopnea, claudication, leg swelling and PND.  Gastrointestinal: Negative for nausea.  Musculoskeletal: Negative for falls.  Skin: Negative for rash.  Neurological: Positive for weakness. Negative for sensory change, speech change, focal weakness and loss of consciousness.  Endo/Heme/Allergies: Does not bruise/bleed easily.  Psychiatric/Behavioral: The patient is not nervous/anxious.     Objective: Telemetry: NSR, 80s, 8 beats of NSVT overnight Physical Exam: Blood pressure 135/55, pulse 73, temperature 97.7 F (36.5 C), temperature source Oral, resp. rate 18, height 5\' 1"  (1.549 m), weight 269 lb 12.8 oz (122.38 kg), SpO2 99 %. Body mass index is 51 kg/(m^2). General: Well developed, well nourished, in no acute distress. Head: Normocephalic, atraumatic, sclera non-icteric, no xanthomas, nares are without discharge. Neck: Negative for carotid bruits. JVP not elevated. Lungs: Clear bilaterally to auscultation without wheezes, rales, or rhonchi. Breathing is unlabored. Heart: RRR S1 S2 without murmurs, rubs, or gallops.  Abdomen: Obese, soft, non-tender, non-distended with normoactive bowel sounds. No  rebound/guarding. Resolving mildly erythematous rash along lower abdomen that has not advanced past marked borders.  Extremities: No clubbing or cyanosis. No edema. Distal pedal pulses are 2+ and equal bilaterally. Neuro: Alert and oriented X 3. Moves all extremities spontaneously. Psych:  Responds to questions appropriately with a normal affect.   Intake/Output Summary (Last 24 hours) at 07/23/15 1028 Last data filed at 07/23/15 0830  Gross per 24 hour  Intake    600 ml  Output      0 ml  Net    600 ml    Inpatient Medications:  . amLODipine  5 mg Oral Daily  . aspirin  81 mg Oral Daily  . baclofen  20 mg Oral BID  . clopidogrel  75 mg Oral Daily  . enalapril  20 mg Oral BID  . enoxaparin (LOVENOX) injection  40 mg Subcutaneous Q24H  . insulin aspart  0-15 Units Subcutaneous TID WC  . insulin aspart  0-5 Units Subcutaneous QHS  . insulin aspart protamine- aspart  25 Units Subcutaneous BID  . magnesium oxide  400 mg Oral BID  . oxybutynin  5 mg Oral Daily  . pantoprazole  40 mg Oral Daily   Infusions:  . sodium chloride 100 mL/hr at 07/22/15 1837    Labs:  Recent Labs  07/22/15 0347 07/22/15 1325 07/23/15 0516  NA 138  --  141  K 2.9* 3.6 3.5  CL 104  --  110  CO2 23  --  23  GLUCOSE 308*  --  119*  BUN 40*  --  33*  CREATININE 1.64*  --  1.09*  CALCIUM 8.6*  --  8.2*  MG 1.4*  --  1.7   No results for input(s): AST, ALT, ALKPHOS, BILITOT, PROT, ALBUMIN in the last 72 hours.  Recent Labs  07/22/15 0347  WBC 9.9  HGB 11.9*  HCT 36.5  MCV 85.3  PLT 232    Recent Labs  07/22/15 0347 07/22/15 0944 07/22/15 1325 07/22/15 1915  TROPONINI <0.03 <0.03 0.03 <0.03   Invalid input(s): POCBNP No results for input(s): HGBA1C in the last 72 hours.   Weights: Filed Weights   07/22/15 0349 07/22/15 0744  Weight: 260 lb (117.935 kg) 269 lb 12.8 oz (122.38 kg)     Radiology/Studies:  Dg Chest 2 View  07/22/2015  CLINICAL DATA:  Acute onset of generalized  chest pain. Initial encounter. EXAM: CHEST  2 VIEW COMPARISON:  Chest radiograph performed 06/17/2014 FINDINGS: The lungs are well-aerated and clear. There is no evidence of focal opacification, pleural effusion or pneumothorax. The heart is normal in size; the mediastinal contour is within normal limits. No acute osseous abnormalities are seen. There is chronic deformity involving the distal left clavicle, with underlying chronic displaced fracture. IMPRESSION: No acute cardiopulmonary process seen. Electronically Signed   By: Garald Balding M.D.   On: 07/22/2015 04:14     Assessment and Plan   1. Atypical chest pain: -Patient has been under a large amount of stress lately with her mother being hospitalized at [facility] and her passing away on 07/20/2014 -She was found to be hypokalemic with a potassium of 2.9 upon admission, s/p repletion to 3.5 thus far -She has been chest pain free since prior to her arrival to the ED -Echo as above with normal LV systolic function and no regional wall motion abnormalities  -No further inpatient ischemic  -Consider outpatient nuclear stress testing should she redevelop symptoms -Need to get her prior cardiac cath on file for thoroughness of her medical record    2. Hypokalemia: -Improved -May not need KCl at this time, could use banana unless this persists/worsens at her follow up  3. Hypomagnesemia: -Replete to 2.0 -Continue Mag ox 400 mg daily   4. HTN: -Per IM  5. History of stroke: -Aspirin and Plavix  6. PAD/carotid artery stenosis: -Needs outpatient follow up for carotid duplex  7. Rash: -Appears to be cellulitis vs skin irritation 2/2 her pannus  -Per IM   Signed, Christell Faith, PA-C Pager: [phone] 07/23/2015, 10:28 AM

## 2015-07-23 NOTE — Telephone Encounter (Signed)
[id number] 

## 2015-07-23 NOTE — Progress Notes (Signed)
Pt informed me rash on lower abdomen has become more red than it was the prior day. No pain or discomfort associated with rash at this time. Rash has been marked on abdomen. Pt stated " I don't want anything done to it if it will keep me here longer in the hospital". MD notified of change and pt statement. Acknowledged. Will continue to monitor.

## 2015-07-23 NOTE — Telephone Encounter (Signed)
[facility] schedule f/u with Dr. Rosanna Randy for 07/29/15. Pt was treated for chest pain at rest and is being discharged today 07/23/15. Thanks TNP

## 2015-07-23 NOTE — Discharge Instructions (Signed)

## 2015-07-26 NOTE — Discharge Summary (Signed)
[facility] [place]  NAME: Susan House    MR#:  [id number]  DATE OF BIRTH:  06/24/1956  DATE OF ADMISSION:  07/22/2015 ADMITTING PHYSICIAN: Saundra Shelling, MD  DATE OF DISCHARGE: 07/23/2015 12:05 PM  PRIMARY CARE PHYSICIAN: Wilhemena Durie, MD    ADMISSION DIAGNOSIS:  Dehydration [E86.0] Hypokalemia [E87.6] Essential hypertension [I10] Chest pain, unspecified chest pain type [R07.9] Type 2 diabetes mellitus with other diabetic kidney complication, without long-term current use of insulin (HCC) [E11.29]  DISCHARGE DIAGNOSIS:  Principal Problem:   Chest pain at rest Active Problems:   Hypokalemia   Dehydration   Type 2 diabetes mellitus with kidney complication, without long-term current use of insulin (HCC)   Grief reaction   Esophageal reflux   Angina pectoris ([place])   SECONDARY DIAGNOSIS:   Past Medical History  Diagnosis Date  . Cellulitis and abscess of face   . Allergy   . Depression   . Hypertension   . Edema   . Arthritis   . Migraine   . GERD (gastroesophageal reflux disease)   . Stroke ([place]) 2000  . Diabetes mellitus ([place])   . Hyperlipidemia   . Morbid obesity ([place])   . IBS (irritable bowel syndrome)   . Urinary incontinence   . History of nuclear stress test     a. 12/2009: lexiscan - negative  . TIA (transient ischemic attack) 2010  . History of echocardiogram     2008: normal, 2011: normal LVSF     ADMITTING HISTORY  HISTORY OF PRESENT ILLNESS: Tarissa Bucknam is a 60 y.o. female with a known history of hypertension, GERD, CVA, diabetes mellitus, hyperlipidemia who, around 2 AM this morning from sleep with chest discomfort. Pain was located left-sided chest was nonradiating. Patient took 3 nitroglycerin tablets which were belonging to her husband prior to the arrival to the ER. Patient also received aspirin was put on Nitropaste. Chest pain got relieved with above intervention. No cough or fever. No  history of travel or sick contacts. Patient was worked up in the emergency room first set of troponin was negative and EKG did not show any ST segment elevation or depression. Patient's mother passed away recently. During the workup in the emergency room patient's potassium was low, it was supplemented in the emergency room. Patient also complained of rash below the umbilical area along the waistline. No itching or any vesicles.    HOSPITAL COURSE:   Patient was admitted to telemetry floor. Troponin x 3 was normal. EKG showed nothing acute. Seen by cardiology Dr. Rockey Situ and thought to have atypical chest pain likely from GERD. Started on protonix. Echo showed normal EF with no focal wall motion abnormalities. chest pain resolved. Discharged home in a stable condition to follow up with cardiology and PCP.  CONSULTS OBTAINED:  Treatment Team:  Minna Merritts, MD  DRUG ALLERGIES:   Allergies  Allergen Reactions  . Morphine And Related Anaphylaxis  . Betadine [Povidone Iodine]   . Fentanyl   . Iodine   . Morphine And Related   . Reglan [Metoclopramide]   . Simvastatin     Muscle pain  . Betadine [Povidone Iodine] Rash  . Tetracyclines & Related Rash  . Tetracyclines & Related Rash    DISCHARGE MEDICATIONS:   Discharge Medication List as of 07/23/2015 11:19 AM    START taking these medications   Details  magnesium oxide (MAG-OX) 400 (241.3 Mg) MG tablet Take 1 tablet (400  mg total) by mouth daily., Starting 07/23/2015, Until Discontinued, Print    pantoprazole (PROTONIX) 40 MG tablet Take 1 tablet (40 mg total) by mouth daily., Starting 07/23/2015, Until Discontinued, Print      CONTINUE these medications which have NOT CHANGED   Details  amLODipine (NORVASC) 5 MG tablet Take 5 mg by mouth daily., Until Discontinued, Historical Med    amoxicillin-clavulanate (AUGMENTIN) 875-125 MG tablet Take 1 tablet by mouth 2 (two) times daily., Starting 07/20/2015, Until Discontinued, Normal     aspirin 325 MG tablet Take 325 mg by mouth daily., Until Discontinued, Historical Med    atorvastatin (LIPITOR) 20 MG tablet Take 20 mg by mouth daily., Until Discontinued, Historical Med    baclofen (LIORESAL) 20 MG tablet Take 20 mg by mouth 2 (two) times daily., Until Discontinued, Historical Med    clopidogrel (PLAVIX) 75 MG tablet Take 1 tablet (75 mg total) by mouth daily., Starting 04/01/2015, Until Discontinued, Normal    enalapril (VASOTEC) 20 MG tablet Take 20 mg by mouth 2 (two) times daily., Until Discontinued, Historical Med    glucose blood test strip 1 each by Other route daily. Check sugar three times daily, DX:E11.9, Starting 07/21/2015, Until Discontinued, Normal    hydrochlorothiazide (HYDRODIURIL) 25 MG tablet Take 25 mg by mouth daily., Until Discontinued, Historical Med    insulin aspart protamine- aspart (NOVOLOG MIX 70/30) (70-30) 100 UNIT/ML injection Inject 25-30 Units into the skin 2 (two) times daily. , Until Discontinued, Historical Med    metFORMIN (GLUCOPHAGE) 1000 MG tablet Take 1,000 mg by mouth 2 (two) times daily with a meal., Until Discontinued, Historical Med    nitroGLYCERIN (NITROSTAT) 0.4 MG SL tablet Place 0.4 mg under the tongue every 5 (five) minutes as needed for chest pain., Until Discontinued, Historical Med    oxybutynin (DITROPAN-XL) 5 MG 24 hr tablet Take 1 tablet (5 mg total) by mouth daily., Starting 07/20/2015, Until Discontinued, Normal         Today    VITAL SIGNS:  Blood pressure 135/55, pulse 73, temperature 97.7 F (36.5 C), temperature source Oral, resp. rate 18, height 5\' 1"  (1.549 m), weight 122.38 kg (269 lb 12.8 oz), SpO2 99 %.  I/O:  No intake or output data in the 24 hours ending 07/26/15 1534  PHYSICAL EXAMINATION:  Physical Exam  GENERAL:  60 y.o.-year-old patient lying in the bed with no acute distress. Obese. LUNGS: Normal breath sounds bilaterally, no wheezing, rales,rhonchi or crepitation. No use of accessory  muscles of respiration.  CARDIOVASCULAR: S1, S2 normal. No murmurs, rubs, or gallops.  ABDOMEN: Soft, non-tender, non-distended. Bowel sounds present. No organomegaly or mass.  NEUROLOGIC: Moves all 4 extremities. PSYCHIATRIC: The patient is alert and oriented x 3.  SKIN: No obvious rash, lesion, or ulcer.   DATA REVIEW:   CBC  Recent Labs Lab 07/22/15 0347  WBC 9.9  HGB 11.9*  HCT 36.5  PLT 232    Chemistries   Recent Labs Lab 07/23/15 0516  NA 141  K 3.5  CL 110  CO2 23  GLUCOSE 119*  BUN 33*  CREATININE 1.09*  CALCIUM 8.2*  MG 1.7    Cardiac Enzymes  Recent Labs Lab 07/22/15 1915  TROPONINI <0.03    Microbiology Results  Results for orders placed or performed in visit on 07/20/15  Urine culture     Status: Abnormal   Collection Time: 07/20/15 12:00 AM  Result Value Ref Range Status   Urine Culture, Routine Final report (A)  Final   Urine Culture result 1 Staphylococcus aureus (A)  Final    Comment: Greater than 100,000 colony forming units per mL Based on resistance to penicillin and susceptibility to oxacillin this isolate would be susceptible to: * Penicillinase-stable penicillins; such as:     Cloxacillin     Dicloxacillin     Nafcillin * Beta-lactam/beta-lactamase inhibitor combinations; such as:     Amoxicillin-clavulanic acid     Ampicillin-sulbactam * Antistaphylococcal cephems; such as:     Cefaclor     Cefuroxime * Antistaphylococcal carbapenems; such as:     Imipenem     Meropenem    RESULT 2 Comment  Final    Comment: Mixed urogenital flora 25,000-50,000 colony forming units per mL    ANTIMICROBIAL SUSCEPTIBILITY Comment  Final    Comment:       ** S = Susceptible; I = Intermediate; R = Resistant **                    P = Positive; N = Negative             MICS are expressed in micrograms per mL    Antibiotic                 RSLT#1    RSLT#2    RSLT#3    RSLT#4 Ciprofloxacin                  R Gentamicin                      S Levofloxacin                   R Linezolid                      S Moxifloxacin                   R Nitrofurantoin                 S Oxacillin                      S Penicillin                     R Quinupristin/Dalfopristin      S Rifampin                       S Tetracycline                   S Trimethoprim/Sulfa             R Vancomycin                     S     RADIOLOGY:  No results found.   Follow up with PCP in 1 week.  Management plans discussed with the patient, family and they are in agreement.  CODE STATUS:   TOTAL TIME TAKING CARE OF THIS PATIENT ON DAY OF DISCHARGE: more than 30 minutes.    Hillary Bow R M.D on 07/26/2015 at 3:34 PM  Between 7am to 6pm - Pager - [phone]  After 6pm go to [email or web address] - password [email or web address]  Office  [phone]  CC: Primary care physician; Wilhemena Durie, MD   Note: This dictation was prepared with Dragon dictation along with smaller  Company secretary. Any transcriptional errors that result from this process are unintentional.

## 2015-07-27 NOTE — Telephone Encounter (Signed)
Patient reports that they added 2 medications during her stay at the hospital. I recommended patient to bring those medications with her on OV so we could review. Patient reports that she has not experienced any chest pain since then.

## 2015-07-27 NOTE — Telephone Encounter (Signed)
Advised patient as below.  

## 2015-07-28 ENCOUNTER — Other Ambulatory Visit: Payer: Self-pay | Admitting: Family Medicine

## 2015-07-28 DIAGNOSIS — I10 Essential (primary) hypertension: Secondary | ICD-10-CM

## 2015-07-28 DIAGNOSIS — R790 Abnormal level of blood mineral: Secondary | ICD-10-CM

## 2015-07-28 DIAGNOSIS — E876 Hypokalemia: Secondary | ICD-10-CM

## 2015-07-29 ENCOUNTER — Ambulatory Visit (INDEPENDENT_AMBULATORY_CARE_PROVIDER_SITE_OTHER): Payer: Medicare HMO | Admitting: Family Medicine

## 2015-07-29 ENCOUNTER — Encounter: Payer: Self-pay | Admitting: Family Medicine

## 2015-07-29 VITALS — BP 168/82 | HR 84 | Temp 97.6°F | Resp 16 | Wt 273.0 lb

## 2015-07-29 DIAGNOSIS — F4321 Adjustment disorder with depressed mood: Secondary | ICD-10-CM

## 2015-07-29 DIAGNOSIS — K219 Gastro-esophageal reflux disease without esophagitis: Secondary | ICD-10-CM | POA: Diagnosis not present

## 2015-07-29 DIAGNOSIS — E876 Hypokalemia: Secondary | ICD-10-CM

## 2015-07-29 DIAGNOSIS — E860 Dehydration: Secondary | ICD-10-CM | POA: Diagnosis not present

## 2015-07-29 DIAGNOSIS — R079 Chest pain, unspecified: Secondary | ICD-10-CM | POA: Diagnosis not present

## 2015-07-29 LAB — COMPREHENSIVE METABOLIC PANEL
ALBUMIN: 4.3 g/dL (ref 3.5–5.5)
ALT: 11 IU/L (ref 0–32)
AST: 18 IU/L (ref 0–40)
Albumin/Globulin Ratio: 1.6 (ref 1.1–2.5)
Alkaline Phosphatase: 84 IU/L (ref 39–117)
BUN/Creatinine Ratio: 20 (ref 9–23)
BUN: 18 mg/dL (ref 6–24)
Bilirubin Total: 0.3 mg/dL (ref 0.0–1.2)
CO2: 21 mmol/L (ref 18–29)
CREATININE: 0.88 mg/dL (ref 0.57–1.00)
Calcium: 9.5 mg/dL (ref 8.7–10.2)
Chloride: 101 mmol/L (ref 96–106)
GFR, EST AFRICAN AMERICAN: 83 mL/min/{1.73_m2} (ref >59)
GFR, EST NON AFRICAN AMERICAN: 72 mL/min/{1.73_m2} (ref >59)
GLOBULIN, TOTAL: 2.7 g/dL (ref 1.5–4.5)
GLUCOSE: 172 mg/dL — AB (ref 65–99)
POTASSIUM: 4.4 mmol/L (ref 3.5–5.2)
Sodium: 143 mmol/L (ref 134–144)
TOTAL PROTEIN: 7 g/dL (ref 6.0–8.5)

## 2015-07-29 MED ORDER — PANTOPRAZOLE SODIUM 40 MG PO TBEC: 40.0000 mg | DELAYED_RELEASE_TABLET | Freq: Every day | ORAL | Status: DC

## 2015-07-29 MED ORDER — MAGNESIUM OXIDE 400 (241.3 MG) MG PO TABS: 400.0000 mg | ORAL_TABLET | Freq: Every day | ORAL | Status: DC
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
## 2015-08-02 ENCOUNTER — Telehealth: Payer: Self-pay | Admitting: Emergency Medicine

## 2015-08-02 DIAGNOSIS — R32 Unspecified urinary incontinence: Secondary | ICD-10-CM

## 2015-08-02 MED ORDER — OXYBUTYNIN CHLORIDE ER 5 MG PO TB24: 5.0000 mg | ORAL_TABLET | Freq: Every day | ORAL | Status: DC

## 2015-08-02 NOTE — Telephone Encounter (Signed)
Sent refill for pt

## 2015-08-03 LAB — GLUCOSE, CAPILLARY: Glucose-Capillary: 164 mg/dL — ABNORMAL HIGH (ref 65–99)

## 2015-08-09 ENCOUNTER — Ambulatory Visit (INDEPENDENT_AMBULATORY_CARE_PROVIDER_SITE_OTHER): Payer: Commercial Managed Care - HMO | Admitting: Family Medicine

## 2015-08-09 ENCOUNTER — Encounter: Payer: Self-pay | Admitting: Family Medicine

## 2015-08-09 VITALS — BP 164/80 | HR 88 | Temp 97.5°F | Resp 18

## 2015-08-09 DIAGNOSIS — N390 Urinary tract infection, site not specified: Secondary | ICD-10-CM

## 2015-08-09 DIAGNOSIS — J320 Chronic maxillary sinusitis: Secondary | ICD-10-CM

## 2015-08-09 DIAGNOSIS — R42 Dizziness and giddiness: Secondary | ICD-10-CM

## 2015-08-09 LAB — POCT URINALYSIS DIPSTICK
BILIRUBIN UA: NEGATIVE
GLUCOSE UA: NEGATIVE
KETONES UA: NEGATIVE
Nitrite, UA: POSITIVE
Spec Grav, UA: 1.025
Urobilinogen, UA: 0.2
pH, UA: 6

## 2015-08-09 MED ORDER — PREDNISONE 10 MG (21) PO TBPK: 10.0000 mg | ORAL_TABLET | Freq: Every day | ORAL | Status: DC

## 2015-08-09 MED ORDER — LEVOFLOXACIN 500 MG PO TABS: 500.0000 mg | ORAL_TABLET | Freq: Every day | ORAL | Status: DC

## 2015-08-09 MED ORDER — KETOROLAC TROMETHAMINE 60 MG/2ML IM SOLN
60.0000 mg | Freq: Once | INTRAMUSCULAR | Status: AC
  Administered 2015-08-09: 60 mg via INTRAMUSCULAR
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
## 2015-08-10 ENCOUNTER — Telehealth: Payer: Self-pay | Admitting: Family Medicine

## 2015-08-10 NOTE — Telephone Encounter (Signed)
Pt called to let Dr. Rosanna Randy know that her headaches have gotten better.  You dont have to call back.  She just said he wanted her to let him know.  Thanks Con Memos

## 2015-08-10 NOTE — Telephone Encounter (Signed)
fyi

## 2015-08-11 LAB — CBC WITH DIFFERENTIAL/PLATELET
BASOS: 1 %
Basophils Absolute: 0 10*3/uL (ref 0.0–0.2)
EOS (ABSOLUTE): 0.3 10*3/uL (ref 0.0–0.4)
EOS: 6 %
HEMATOCRIT: 35.8 % (ref 34.0–46.6)
HEMOGLOBIN: 11.7 g/dL (ref 11.1–15.9)
IMMATURE GRANULOCYTES: 0 %
Immature Grans (Abs): 0 10*3/uL (ref 0.0–0.1)
LYMPHS ABS: 0.9 10*3/uL (ref 0.7–3.1)
Lymphs: 14 %
MCH: 27.6 pg (ref 26.6–33.0)
MCHC: 32.7 g/dL (ref 31.5–35.7)
MCV: 84 fL (ref 79–97)
MONOCYTES: 4 %
MONOS ABS: 0.2 10*3/uL (ref 0.1–0.9)
Neutrophils Absolute: 4.7 10*3/uL (ref 1.4–7.0)
Neutrophils: 75 %
Platelets: 279 10*3/uL (ref 150–379)
RBC: 4.24 x10E6/uL (ref 3.77–5.28)
RDW: 15.1 % (ref 12.3–15.4)
WBC: 6.2 10*3/uL (ref 3.4–10.8)

## 2015-08-11 LAB — SEDIMENTATION RATE: Sed Rate: 7 mm/hr (ref 0–40)

## 2015-08-11 NOTE — Telephone Encounter (Signed)
-----   Message from Jerrol Banana., MD sent at 08/11/2015 10:22 AM EST ----- Blood work normal. Urine culture pending

## 2015-08-11 NOTE — Telephone Encounter (Signed)
Patient advised as below. sd 

## 2015-08-11 NOTE — Progress Notes (Signed)
Pt advised as below. sd

## 2015-08-12 ENCOUNTER — Telehealth: Payer: Self-pay

## 2015-08-12 LAB — URINE CULTURE

## 2015-08-12 NOTE — Telephone Encounter (Signed)
-----   Message from Jerrol Banana., MD sent at 08/12/2015  3:00 PM EST ----- UTI sensitive to Levaquin.

## 2015-08-12 NOTE — Telephone Encounter (Signed)
Patient advised as below. sd 

## 2015-08-18 ENCOUNTER — Ambulatory Visit: Payer: Self-pay | Admitting: Family Medicine

## 2015-08-18 ENCOUNTER — Encounter: Payer: Self-pay | Admitting: Family Medicine

## 2015-08-18 ENCOUNTER — Ambulatory Visit (INDEPENDENT_AMBULATORY_CARE_PROVIDER_SITE_OTHER): Payer: Commercial Managed Care - HMO | Admitting: Family Medicine

## 2015-08-18 VITALS — BP 142/58 | HR 88 | Temp 97.7°F | Resp 16

## 2015-08-18 DIAGNOSIS — M5137 Other intervertebral disc degeneration, lumbosacral region: Secondary | ICD-10-CM | POA: Diagnosis not present

## 2015-08-18 MED ORDER — GABAPENTIN 100 MG PO CAPS: 100.0000 mg | ORAL_CAPSULE | Freq: Three times a day (TID) | ORAL | Status: DC

## 2015-08-18 MED ORDER — OXYCODONE HCL 10 MG PO TABS
10.0000 mg | ORAL_TABLET | ORAL | Status: DC | PRN
Start: 2015-08-18 — End: 2015-11-01

## 2015-08-18 NOTE — Progress Notes (Signed)
Patient ID: Susan House, female   DOB: [birth year], 60 y.o.   MRN: [id number]       Patient: Susan House Female    DOB: Aug 29, 1955   60 y.o.   MRN: [id number] Visit Date: 08/18/2015  Today's Provider: Wilhemena Durie, MD   Chief Complaint  Patient presents with  . Back Pain    Chronic pain    Subjective:    Back Pain This is a chronic problem. The problem occurs constantly. The problem has been gradually worsening since onset. The quality of the pain is described as aching and stabbing. The pain is moderate (to severe). The pain is the same all the time. Stiffness is present all day. Pertinent negatives include no numbness, paresthesias, pelvic pain, tingling or weakness. She has tried analgesics for the symptoms. The treatment provided no relief.  Patient reports that pain on scale 1-10 is a 9. Currently pain is 7-8.      Allergies  Allergen Reactions  . Morphine And Related Anaphylaxis  . Betadine [Povidone Iodine]   . Fentanyl   . Iodine   . Morphine And Related   . Reglan [Metoclopramide]   . Simvastatin     Muscle pain  . Betadine [Povidone Iodine] Rash  . Tetracyclines & Related Rash  . Tetracyclines & Related Rash   Previous Medications   AMLODIPINE (NORVASC) 5 MG TABLET    Take 5 mg by mouth daily.   ASPIRIN 325 MG TABLET    Take 325 mg by mouth daily.   ATORVASTATIN (LIPITOR) 20 MG TABLET    Take 20 mg by mouth daily.   BACLOFEN (LIORESAL) 20 MG TABLET    Take 20 mg by mouth 2 (two) times daily.   CLOPIDOGREL (PLAVIX) 75 MG TABLET    Take 1 tablet (75 mg total) by mouth daily.   ENALAPRIL (VASOTEC) 20 MG TABLET    Take 20 mg by mouth 2 (two) times daily.   GLUCOSE BLOOD TEST STRIP    1 each by Other route daily. Check sugar three times daily, DX:E11.9   HYDROCHLOROTHIAZIDE (HYDRODIURIL) 25 MG TABLET    Take 25 mg by mouth daily.   INSULIN ASPART PROTAMINE- ASPART (NOVOLOG MIX 70/30) (70-30) 100 UNIT/ML INJECTION    Inject 25-30 Units into the skin 2  (two) times daily.    LEVOFLOXACIN (LEVAQUIN) 500 MG TABLET    Take 1 tablet (500 mg total) by mouth daily.   MAGNESIUM OXIDE (MAG-OX) 400 (241.3 MG) MG TABLET    Take 1 tablet (400 mg total) by mouth daily.   METFORMIN (GLUCOPHAGE) 1000 MG TABLET    Take 1,000 mg by mouth 2 (two) times daily with a meal.   NITROGLYCERIN (NITROSTAT) 0.4 MG SL TABLET    Place 0.4 mg under the tongue every 5 (five) minutes as needed for chest pain.   OXYBUTYNIN (DITROPAN-XL) 5 MG 24 HR TABLET    Take 1 tablet (5 mg total) by mouth daily.   PANTOPRAZOLE (PROTONIX) 40 MG TABLET    Take 1 tablet (40 mg total) by mouth daily.   PREDNISONE (STERAPRED UNI-PAK 21 TAB) 10 MG (21) TBPK TABLET    Take 1 tablet (10 mg total) by mouth daily.    Review of Systems  Constitutional: Negative.   Cardiovascular: Negative.   Genitourinary: Negative for pelvic pain.  Musculoskeletal: Positive for back pain.  Neurological: Negative for tingling, weakness, numbness and paresthesias.    Social History  Substance Use Topics  .  Smoking status: Never Smoker   . Smokeless tobacco: Never Used  . Alcohol Use: No   Objective:   BP 142/58 mmHg  Pulse 88  Temp(Src) 97.7 F (36.5 C)  Resp 16  Wt   Physical Exam  Constitutional: She is oriented to person, place, and time. She appears well-developed and well-nourished.  Musculoskeletal:  Tender over S1 with palpitation.   Neurological: She is alert and oriented to person, place, and time. She has normal reflexes.  Tender over both SI joints-R>L. 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 No new deficit from back issue.    Assessment & Plan:     There are no diagnoses linked to this encounter. Chronic Low Back Pain/LS DDD Try Gabapentin and change Vicodin To Oxycodone q 4 hours prn. RTC 2 weeks. Pain is 8/10 at worst. 7/10 now. Discussed pt with Dr Catheryn Bacon who will see pt in 1 month. S/P CVA I have done the exam and reviewed the above chart and it is accurate to the best of  my knowledge.       Lizandra Zakrzewski Cranford Mon, MD  [facility]

## 2015-08-19 ENCOUNTER — Ambulatory Visit: Payer: Self-pay | Admitting: Family Medicine

## 2015-09-01 ENCOUNTER — Ambulatory Visit (INDEPENDENT_AMBULATORY_CARE_PROVIDER_SITE_OTHER): Payer: Commercial Managed Care - HMO | Admitting: Family Medicine

## 2015-09-01 ENCOUNTER — Encounter: Payer: Self-pay | Admitting: Family Medicine

## 2015-09-01 VITALS — BP 146/62 | HR 98 | Temp 97.8°F | Resp 16

## 2015-09-01 DIAGNOSIS — M549 Dorsalgia, unspecified: Secondary | ICD-10-CM | POA: Diagnosis not present

## 2015-09-01 DIAGNOSIS — M79641 Pain in right hand: Secondary | ICD-10-CM

## 2015-09-01 DIAGNOSIS — M109 Gout, unspecified: Secondary | ICD-10-CM

## 2015-09-01 DIAGNOSIS — G8929 Other chronic pain: Secondary | ICD-10-CM | POA: Diagnosis not present

## 2015-09-01 DIAGNOSIS — M10041 Idiopathic gout, right hand: Secondary | ICD-10-CM

## 2015-09-01 MED ORDER — COLCHICINE 0.6 MG PO TABS: 0.6000 mg | ORAL_TABLET | Freq: Two times a day (BID) | ORAL | Status: DC

## 2015-09-01 NOTE — Progress Notes (Signed)
Patient ID: Susan House, female   DOB: 1955-07-25, 60 y.o.   MRN: [id number]       Patient: Susan House Female    DOB: [birth year]   60 y.o.   MRN: [id number] Visit Date: 09/01/2015  Today's Provider: Wilhemena Durie, MD   Chief Complaint  Patient presents with  . Back Pain    chronic back pain. 2 week F/U.   Marland Kitchen Hand Pain    X 1 week.    Subjective:    Back Pain This is a chronic problem. The problem is unchanged. The quality of the pain is described as aching. The pain does not radiate. The pain is moderate. The pain is the same all the time. Stiffness is present all day. Pertinent negatives include no numbness, paresthesias, tingling or weakness. She has tried analgesics, chiropractic manipulation and NSAIDs for the symptoms. The treatment provided no relief.  Hand Pain  The incident occurred more than 1 week ago. There was no injury mechanism. The pain is present in the right hand. The quality of the pain is described as aching. The pain does not radiate. The pain is mild. The pain has been constant since the incident. Pertinent negatives include no numbness or tingling. She has tried rest for the symptoms. The treatment provided no relief.  Patient reports that she can not tell any difference in her pain since starting Gabapentin. She reports that her back pain is about the same. She also mentions that she has pain in her right hand that is in her joints. She denies any injury.      Allergies  Allergen Reactions  . Morphine And Related Anaphylaxis  . Betadine [Povidone Iodine]   . Fentanyl   . Iodine   . Morphine And Related   . Reglan [Metoclopramide]   . Simvastatin     Muscle pain  . Betadine [Povidone Iodine] Rash  . Tetracyclines & Related Rash  . Tetracyclines & Related Rash   Previous Medications   AMLODIPINE (NORVASC) 5 MG TABLET    Take 5 mg by mouth daily.   ASPIRIN 325 MG TABLET    Take 325 mg by mouth daily.   ATORVASTATIN (LIPITOR) 20 MG TABLET     Take 20 mg by mouth daily.   BACLOFEN (LIORESAL) 20 MG TABLET    Take 20 mg by mouth 2 (two) times daily.   CLOPIDOGREL (PLAVIX) 75 MG TABLET    Take 1 tablet (75 mg total) by mouth daily.   ENALAPRIL (VASOTEC) 20 MG TABLET    Take 20 mg by mouth 2 (two) times daily.   GABAPENTIN (NEURONTIN) 100 MG CAPSULE    Take 1 capsule (100 mg total) by mouth 3 (three) times daily.   GLUCOSE BLOOD TEST STRIP    1 each by Other route daily. Check sugar three times daily, DX:E11.9   HYDROCHLOROTHIAZIDE (HYDRODIURIL) 25 MG TABLET    Take 25 mg by mouth daily.   INSULIN ASPART PROTAMINE- ASPART (NOVOLOG MIX 70/30) (70-30) 100 UNIT/ML INJECTION    Inject 25-30 Units into the skin 2 (two) times daily.    LEVOFLOXACIN (LEVAQUIN) 500 MG TABLET    Take 1 tablet (500 mg total) by mouth daily.   MAGNESIUM OXIDE (MAG-OX) 400 (241.3 MG) MG TABLET    Take 1 tablet (400 mg total) by mouth daily.   METFORMIN (GLUCOPHAGE) 1000 MG TABLET    Take 1,000 mg by mouth 2 (two) times daily with a meal.   NITROGLYCERIN (  NITROSTAT) 0.4 MG SL TABLET    Place 0.4 mg under the tongue every 5 (five) minutes as needed for chest pain.   OXYBUTYNIN (DITROPAN-XL) 5 MG 24 HR TABLET    Take 1 tablet (5 mg total) by mouth daily.   OXYCODONE HCL 10 MG TABS    Take 1 tablet (10 mg total) by mouth every 4 (four) hours as needed.   PANTOPRAZOLE (PROTONIX) 40 MG TABLET    Take 1 tablet (40 mg total) by mouth daily.   PREDNISONE (STERAPRED UNI-PAK 21 TAB) 10 MG (21) TBPK TABLET    Take 1 tablet (10 mg total) by mouth daily.    Review of Systems  Constitutional: Negative.   Musculoskeletal: Positive for back pain, joint swelling, arthralgias and gait problem.  Skin: Negative.   Neurological: Negative for tingling, weakness, numbness and paresthesias.    Social History  Substance Use Topics  . Smoking status: Never Smoker   . Smokeless tobacco: Never Used  . Alcohol Use: No   Objective:   BP 146/62 mmHg  Pulse 98  Temp(Src) 97.8 F  (36.6 C)  Resp 16  Wt   Physical Exam  Constitutional: She is oriented to person, place, and time. She appears well-developed and well-nourished.  HENT:  Head: Normocephalic and atraumatic.  Right Ear: External ear normal.  Left Ear: External ear normal.  Nose: Nose normal.  Musculoskeletal:  Swollen, and warm to the touch, and tender to the MCP joint.  Neurovascular is intact.   Neurological: She is alert and oriented to person, place, and time.  Chronic left hemiparesis.        Assessment & Plan:      1. Chronic back pain Has appt later this month with Dr Maryjean Ka from pain clini for injection.  2. Acute gout of right hand, unspecified cause  - CBC with Differential/Platelet - colchicine 0.6 MG tablet; Take 1 tablet (0.6 mg total) by mouth 2 (two) times daily.  Dispense: 60 tablet; Refill: 1 - Uric acid  3. Pain of right hand Gout most likely etiology     I have done the exam and reviewed the above chart and it is accurate to the best of my knowledge.   Richard Cranford Mon, MD  [facility]

## 2015-09-02 ENCOUNTER — Ambulatory Visit (INDEPENDENT_AMBULATORY_CARE_PROVIDER_SITE_OTHER): Payer: Commercial Managed Care - HMO | Admitting: Family Medicine

## 2015-09-02 VITALS — BP 138/80 | HR 78 | Temp 97.9°F | Resp 14

## 2015-09-02 DIAGNOSIS — R059 Cough, unspecified: Secondary | ICD-10-CM

## 2015-09-02 DIAGNOSIS — R05 Cough: Secondary | ICD-10-CM

## 2015-09-02 DIAGNOSIS — J069 Acute upper respiratory infection, unspecified: Secondary | ICD-10-CM | POA: Diagnosis not present

## 2015-09-02 LAB — CBC WITH DIFFERENTIAL/PLATELET
BASOS: 1 %
Basophils Absolute: 0 10*3/uL (ref 0.0–0.2)
EOS (ABSOLUTE): 0.9 10*3/uL — AB (ref 0.0–0.4)
EOS: 15 %
HEMATOCRIT: 35.2 % (ref 34.0–46.6)
HEMOGLOBIN: 11.3 g/dL (ref 11.1–15.9)
IMMATURE GRANS (ABS): 0 10*3/uL (ref 0.0–0.1)
Immature Granulocytes: 0 %
LYMPHS: 21 %
Lymphocytes Absolute: 1.2 10*3/uL (ref 0.7–3.1)
MCH: 27.2 pg (ref 26.6–33.0)
MCHC: 32.1 g/dL (ref 31.5–35.7)
MCV: 85 fL (ref 79–97)
Monocytes Absolute: 0.5 10*3/uL (ref 0.1–0.9)
Monocytes: 9 %
NEUTROS ABS: 3.3 10*3/uL (ref 1.4–7.0)
Neutrophils: 54 %
PLATELETS: 266 10*3/uL (ref 150–379)
RBC: 4.15 x10E6/uL (ref 3.77–5.28)
RDW: 15.2 % (ref 12.3–15.4)
WBC: 5.9 10*3/uL (ref 3.4–10.8)

## 2015-09-02 LAB — URIC ACID: Uric Acid: 8.5 mg/dL — ABNORMAL HIGH (ref 2.5–7.1)

## 2015-09-02 MED ORDER — AZITHROMYCIN 250 MG PO TABS: ORAL_TABLET | ORAL | Status: DC

## 2015-09-02 MED ORDER — HYDROCODONE-HOMATROPINE 5-1.5 MG/5ML PO SYRP: 5.0000 mL | ORAL_SOLUTION | Freq: Three times a day (TID) | ORAL | Status: DC | PRN
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
## 2015-09-06 ENCOUNTER — Other Ambulatory Visit: Payer: Self-pay | Admitting: Family Medicine

## 2015-09-08 ENCOUNTER — Ambulatory Visit (INDEPENDENT_AMBULATORY_CARE_PROVIDER_SITE_OTHER): Payer: Commercial Managed Care - HMO | Admitting: Family Medicine

## 2015-09-08 ENCOUNTER — Encounter: Payer: Self-pay | Admitting: Family Medicine

## 2015-09-08 VITALS — BP 118/92 | HR 80 | Temp 97.5°F | Resp 12

## 2015-09-08 DIAGNOSIS — M10041 Idiopathic gout, right hand: Secondary | ICD-10-CM | POA: Diagnosis not present

## 2015-09-08 DIAGNOSIS — M109 Gout, unspecified: Secondary | ICD-10-CM

## 2015-09-08 DIAGNOSIS — R05 Cough: Secondary | ICD-10-CM

## 2015-09-08 DIAGNOSIS — R059 Cough, unspecified: Secondary | ICD-10-CM
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
## 2015-09-09 ENCOUNTER — Telehealth: Payer: Self-pay | Admitting: Family Medicine

## 2015-09-09 NOTE — Telephone Encounter (Signed)
Cipro 250 mg daily 5 days

## 2015-09-09 NOTE — Telephone Encounter (Signed)
What would you like to do ?

## 2015-09-09 NOTE — Telephone Encounter (Signed)
Pt states she is having burning and pain when voiding.  Pt is also having frequency.  Pt is requesting a Rx to help with this.  Hyman Hopes.  CB#[id number]/MW

## 2015-09-10 ENCOUNTER — Other Ambulatory Visit: Payer: Self-pay

## 2015-09-10 MED ORDER — CIPROFLOXACIN HCL 250 MG PO TABS: 250.0000 mg | ORAL_TABLET | Freq: Two times a day (BID) | ORAL | Status: DC

## 2015-09-13 ENCOUNTER — Other Ambulatory Visit: Payer: Self-pay

## 2015-09-13 MED ORDER — ATORVASTATIN CALCIUM 20 MG PO TABS: 20.0000 mg | ORAL_TABLET | Freq: Every day | ORAL | Status: DC

## 2015-09-13 MED ORDER — GABAPENTIN 100 MG PO CAPS: 100.0000 mg | ORAL_CAPSULE | Freq: Three times a day (TID) | ORAL | Status: DC

## 2015-09-16 ENCOUNTER — Ambulatory Visit: Payer: Commercial Managed Care - HMO | Admitting: Cardiovascular Disease

## 2015-09-20 ENCOUNTER — Ambulatory Visit (INDEPENDENT_AMBULATORY_CARE_PROVIDER_SITE_OTHER): Payer: Commercial Managed Care - HMO | Admitting: Family Medicine

## 2015-09-20 VITALS — BP 136/72 | HR 88 | Temp 97.8°F | Resp 18

## 2015-09-20 DIAGNOSIS — M10041 Idiopathic gout, right hand: Secondary | ICD-10-CM | POA: Diagnosis not present

## 2015-09-20 DIAGNOSIS — L84 Corns and callosities: Secondary | ICD-10-CM

## 2015-09-20 DIAGNOSIS — M109 Gout, unspecified: Secondary | ICD-10-CM

## 2015-09-20 MED ORDER — COLCHICINE 0.6 MG PO TABS: 0.6000 mg | ORAL_TABLET | Freq: Two times a day (BID) | ORAL | Status: DC

## 2015-09-20 MED ORDER — ALLOPURINOL 100 MG PO TABS: 100.0000 mg | ORAL_TABLET | Freq: Every day | ORAL | Status: DC
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
## 2015-09-27 ENCOUNTER — Ambulatory Visit: Payer: Self-pay | Admitting: Family Medicine

## 2015-10-03 ENCOUNTER — Other Ambulatory Visit: Payer: Self-pay | Admitting: Family Medicine

## 2015-10-07 ENCOUNTER — Other Ambulatory Visit: Payer: Self-pay | Admitting: Family Medicine

## 2015-10-07 ENCOUNTER — Ambulatory Visit (INDEPENDENT_AMBULATORY_CARE_PROVIDER_SITE_OTHER): Payer: Commercial Managed Care - HMO | Admitting: Family Medicine

## 2015-10-07 VITALS — BP 156/66 | HR 84 | Temp 97.5°F | Resp 18

## 2015-10-07 DIAGNOSIS — N390 Urinary tract infection, site not specified: Secondary | ICD-10-CM | POA: Diagnosis not present

## 2015-10-07 DIAGNOSIS — R319 Hematuria, unspecified: Secondary | ICD-10-CM

## 2015-10-07 DIAGNOSIS — R82998 Other abnormal findings in urine: Secondary | ICD-10-CM

## 2015-10-07 DIAGNOSIS — R32 Unspecified urinary incontinence: Secondary | ICD-10-CM | POA: Diagnosis not present

## 2015-10-07 LAB — POCT URINALYSIS DIPSTICK
BILIRUBIN UA: NEGATIVE
GLUCOSE UA: NEGATIVE
KETONES UA: NEGATIVE
Nitrite, UA: POSITIVE
SPEC GRAV UA: 1.025
Urobilinogen, UA: NEGATIVE
pH, UA: 5

## 2015-10-07 MED ORDER — SULFAMETHOXAZOLE-TRIMETHOPRIM 800-160 MG PO TABS: 1.0000 | ORAL_TABLET | Freq: Two times a day (BID) | ORAL | Status: DC
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
## 2015-10-09 ENCOUNTER — Telehealth: Payer: Self-pay | Admitting: Family Medicine

## 2015-10-09 NOTE — Telephone Encounter (Signed)
Is she showing any signs of any sort of infection? May need to be evaluated to make sure as that is normally why BS become elevated. If her sugar is not responding to her insulin 70/30 she may need to go to the ER for further evaluation. Biggest concern is hyperosmolar hyperglycemia or DKA (DKA is more common in T1DM). Does she have someone else in the house to monitor her for confusion or AMS?

## 2015-10-09 NOTE — Telephone Encounter (Signed)
Tawanna Sat spoke with patient and advised to go the hospital if her sugars don't go down below 300.  Thanks,  -Jennife Zaucha

## 2015-10-09 NOTE — Telephone Encounter (Signed)
Pt states her blood sugar was a little over 300 and this morning fasting it was 456.  Pt states she has taken 50 units total of Humalog 730.  Pt states she will recheck in 2 hours to see if she need to take more medication.  CB#[phone]/MW  Susan House will follow up with Susan House and call pt back/MW

## 2015-10-11 ENCOUNTER — Other Ambulatory Visit: Payer: Self-pay

## 2015-10-11 ENCOUNTER — Ambulatory Visit: Payer: Self-pay | Admitting: Family Medicine

## 2015-10-11 DIAGNOSIS — N390 Urinary tract infection, site not specified: Secondary | ICD-10-CM

## 2015-10-11 LAB — URINE CULTURE

## 2015-10-11 MED ORDER — SULFAMETHOXAZOLE-TRIMETHOPRIM 800-160 MG PO TABS: 1.0000 | ORAL_TABLET | Freq: Two times a day (BID) | ORAL | Status: DC

## 2015-10-11 MED ORDER — NITROGLYCERIN 0.4 MG SL SUBL: 0.4000 mg | SUBLINGUAL_TABLET | SUBLINGUAL | Status: AC | PRN

## 2015-10-12 ENCOUNTER — Telehealth: Payer: Self-pay

## 2015-10-12 NOTE — Telephone Encounter (Signed)
Advised pt of lab results. Pt verbally acknowledges understanding. Emily Drozdowski, CMA   

## 2015-10-12 NOTE — Telephone Encounter (Signed)
-----   Message from Jerrol Banana., MD sent at 10/12/2015 10:21 AM EDT ----- UTI sensitive to Cipro.

## 2015-10-13 ENCOUNTER — Other Ambulatory Visit: Payer: Self-pay

## 2015-10-13 ENCOUNTER — Other Ambulatory Visit: Payer: Self-pay | Admitting: Family Medicine

## 2015-10-13 DIAGNOSIS — M109 Gout, unspecified: Secondary | ICD-10-CM

## 2015-10-14 LAB — URIC ACID: URIC ACID: 6.3 mg/dL (ref 2.5–7.1)

## 2015-10-17 ENCOUNTER — Other Ambulatory Visit: Payer: Self-pay | Admitting: Family Medicine

## 2015-10-21 LAB — HM DIABETES EYE EXAM

## 2015-10-26 ENCOUNTER — Telehealth: Payer: Self-pay | Admitting: Family Medicine

## 2015-10-26 NOTE — Telephone Encounter (Signed)
Pt contacted office for refill request on the following medications:  Hydrocodone-Acetaminophen 10-325mg .  This has not been filed since 01/28/2013.  CB#[phone]/MW  Pt has scheduled an appointment with Dr Rosanna Randy for Monday.

## 2015-10-26 NOTE — Telephone Encounter (Signed)
Please review-aa 

## 2015-10-27 NOTE — Telephone Encounter (Signed)
Please review-aa 

## 2015-10-27 NOTE — Telephone Encounter (Signed)
Spoke with patient, in February we switched to Oxycodone from [place] due to pain was worsening. Patient states she felt like Oxycodone is not helping as Norco did and she hurts. She had Norco RX printed out the last time we gave it to her per pharmacist per patient in 2015 and when she went to try and get it filled pharmacist told her it was too old and they could not fill it. I advised patient that it will be best, if she has enough of oxycodone to last till Monday, to wait and discuss with Dr. Rosanna Randy. Patient was ok with that and had Oxycodone to last till then. Will address on Monday-aa

## 2015-10-27 NOTE — Telephone Encounter (Signed)
This is a schedule II medication and I am not in the office to sign it. Please forward to a provider who is in the office this week. Thanks.

## 2015-10-27 NOTE — Telephone Encounter (Signed)
Why is she needing this? I see she has oxy 10mg .

## 2015-11-01 ENCOUNTER — Encounter: Payer: Self-pay | Admitting: Family Medicine

## 2015-11-01 ENCOUNTER — Ambulatory Visit (INDEPENDENT_AMBULATORY_CARE_PROVIDER_SITE_OTHER): Payer: Commercial Managed Care - HMO | Admitting: Family Medicine

## 2015-11-01 ENCOUNTER — Encounter: Payer: Self-pay | Admitting: Cardiovascular Disease

## 2015-11-01 ENCOUNTER — Ambulatory Visit (INDEPENDENT_AMBULATORY_CARE_PROVIDER_SITE_OTHER): Payer: Commercial Managed Care - HMO | Admitting: Cardiovascular Disease

## 2015-11-01 VITALS — BP 130/60 | HR 79 | Ht 62.0 in | Wt 275.1 lb

## 2015-11-01 VITALS — BP 142/80 | HR 80 | Temp 97.9°F | Resp 14

## 2015-11-01 DIAGNOSIS — F329 Major depressive disorder, single episode, unspecified: Secondary | ICD-10-CM

## 2015-11-01 DIAGNOSIS — R002 Palpitations: Secondary | ICD-10-CM

## 2015-11-01 DIAGNOSIS — N183 Chronic kidney disease, stage 3 unspecified: Secondary | ICD-10-CM

## 2015-11-01 DIAGNOSIS — M549 Dorsalgia, unspecified: Secondary | ICD-10-CM

## 2015-11-01 DIAGNOSIS — E1122 Type 2 diabetes mellitus with diabetic chronic kidney disease: Secondary | ICD-10-CM | POA: Diagnosis not present

## 2015-11-01 DIAGNOSIS — G8929 Other chronic pain: Secondary | ICD-10-CM | POA: Diagnosis not present

## 2015-11-01 DIAGNOSIS — I10 Essential (primary) hypertension: Secondary | ICD-10-CM

## 2015-11-01 DIAGNOSIS — E785 Hyperlipidemia, unspecified: Secondary | ICD-10-CM

## 2015-11-01 DIAGNOSIS — I209 Angina pectoris, unspecified: Secondary | ICD-10-CM

## 2015-11-01 DIAGNOSIS — F32A Depression, unspecified: Secondary | ICD-10-CM

## 2015-11-01 MED ORDER — HYDROCODONE-ACETAMINOPHEN 10-325 MG PO TABS: 1.0000 | ORAL_TABLET | ORAL | Status: DC | PRN

## 2015-11-01 NOTE — Progress Notes (Signed)
Patient ID: Susan House, female    DOB: 1956/06/14, 60 y.o.   MRN: [id number]  HPI Comments: 60 year old female with a history of stroke in 2000 followed by posterior circulatory TIA in June of 2010, on Plavix and aspirin, chronic dizziness in the past secondary to dehydration, restless leg syndrome, type 2 diabetes, previous episodes of chest pain who presents for routine follow-up of her stroke, chest pain symptoms. She has residual left side weakness arm and leg from her stroke  In follow-up, she denies any significant chest pain on exertion  she continues to have chronic diarrhea that comes and goes Sometimes has diarrhea 2 days per week, other weeks with no diarrhea Review of lab work shows dehydration  in early January 2017, renal function back to normal several days later.  EKG on today's visit shows normal sinus rhythm with rate 76 bpm, no significant ST changes, there is T-wave abnormality in lead 3, aVF No change from prior EKGs  Other past medical history reviewed cardiac workup in the past for chest pain including numerous echocardiograms dating back to 2008, cardiac catheterization. Results of the latter is not available at this time chest pain early December 2015. She was seen in the emergency room to summer second with normal workup at that time, cardiac enzymes negative, chest x-ray reviewed showing no abnormalities. Normal EKG She returned to the emergency room with hematuria 07/02/2014 from urinary tract infection. These are becoming more common per the patient  Last echocardiogram July 2015 showing normal ejection fraction She reports having some arrhythmia in early January 2016 in the setting of infection of her toe. Since her toe infection has resolved, she's had no further palpitations. She was treated with Augmentin. No EKG documenting any arrhythmia.  Prior carotid ultrasound 2008 showing mild to moderate carotid arterial disease. Stress test July 2011 showing no  ischemia. At that time was having chest pain and had rule out Continues to have chronic dizziness, typically when she stands Mouth is always dry, she attributes this to the oxybutynin  Initial blood pressure today was 100/52, repeat blood pressure 110/70 EKG on today's visit shows normal sinus rhythm with rate 68 bpm, no significant ST or T-wave changes  Lab work Junior 2015 showing total cholesterol 132, LDL 67 EKG November 2014 showing rare PVC   Allergies  Allergen Reactions  . Morphine And Related Anaphylaxis  . Betadine [Povidone Iodine]   . Fentanyl   . Iodine   . Morphine And Related   . Reglan [Metoclopramide]   . Simvastatin     Muscle pain  . Betadine [Povidone Iodine] Rash  . Tetracyclines & Related Rash  . Tetracyclines & Related Rash    Outpatient Encounter Prescriptions as of 11/01/2015  Medication Sig  . allopurinol (ZYLOPRIM) 100 MG tablet Take 1 tablet (100 mg total) by mouth daily.  Marland Kitchen amLODipine (NORVASC) 5 MG tablet TAKE 1 TABLET EVERY DAY  . aspirin 325 MG tablet Take 325 mg by mouth daily.  Marland Kitchen atorvastatin (LIPITOR) 20 MG tablet Take 1 tablet (20 mg total) by mouth at bedtime.  . baclofen (LIORESAL) 20 MG tablet Take 20 mg by mouth 2 (two) times daily.  . clopidogrel (PLAVIX) 75 MG tablet Take 1 tablet (75 mg total) by mouth daily.  . colchicine 0.6 MG tablet Take 1 tablet (0.6 mg total) by mouth 2 (two) times daily.  . enalapril (VASOTEC) 20 MG tablet Take 20 mg by mouth 2 (two) times daily.  Marland Kitchen gabapentin (NEURONTIN)  100 MG capsule Take 1 capsule (100 mg total) by mouth 3 (three) times daily.  Marland Kitchen glucose blood test strip 1 each by Other route daily. Check sugar three times daily, DX:E11.9  . hydrochlorothiazide (HYDRODIURIL) 25 MG tablet TAKE 1 TABLET EVERY DAY  . HYDROcodone-acetaminophen (NORCO) 10-325 MG tablet Take 1-2 tablets by mouth every 4 (four) hours as needed.  . insulin aspart protamine- aspart (NOVOLOG MIX 70/30) (70-30) 100 UNIT/ML injection  Inject 25-30 Units into the skin 2 (two) times daily.   . magnesium oxide (MAG-OX) 400 (241.3 Mg) MG tablet Take 1 tablet (400 mg total) by mouth daily.  . metFORMIN (GLUCOPHAGE) 1000 MG tablet TAKE 1 TABLET TWICE DAILY  . nitroGLYCERIN (NITROSTAT) 0.4 MG SL tablet Place 1 tablet (0.4 mg total) under the tongue every 5 (five) minutes as needed for chest pain.  Marland Kitchen oxybutynin (DITROPAN-XL) 5 MG 24 hr tablet Take 1 tablet (5 mg total) by mouth daily.  . pantoprazole (PROTONIX) 40 MG tablet Take 1 tablet (40 mg total) by mouth daily.  Marland Kitchen sulfamethoxazole-trimethoprim (BACTRIM DS,SEPTRA DS) 800-160 MG tablet Take 1 tablet by mouth 2 (two) times daily.   No facility-administered encounter medications on file as of 11/01/2015.    Past Medical History  Diagnosis Date  . Cellulitis and abscess of face   . Allergy   . Depression   . Hypertension   . Edema   . Arthritis   . Migraine   . GERD (gastroesophageal reflux disease)   . Stroke ([place]) 2000  . Diabetes mellitus ([place])   . Hyperlipidemia   . Morbid obesity ([place])   . IBS (irritable bowel syndrome)   . Urinary incontinence   . History of nuclear stress test     a. 12/2009: lexiscan - negative  . TIA (transient ischemic attack) 2010  . History of echocardiogram     2008: normal, 2011: normal LVSF    Past Surgical History  Procedure Laterality Date  . Colonoscopy  2010  . Appendectomy    . Hernia repair    . Eye surgery    . Upper gi endoscopy    . Carpel tunnel surgery    . Back surgery      extensive spinal fusion  . Shoulder surgery    . Wrist fracture surgery Left   . Abdominoplasty    . Toe surgery    . Abdominal hysterectomy    . Cataract extraction      x 2  . Cholecystectomy    . Cesarean section      Social History  reports that she has never smoked. She has never used smokeless tobacco. She reports that she does not drink alcohol or use illicit drugs.  Family History family history includes Arrhythmia in her  mother; Coronary artery disease in her father; Depression in her sister and sister; Diabetes in her father, sister, and sister; Heart attack (age of onset: 38) in her father; Heart disease in her father and mother; Hyperlipidemia in her father, mother, and sister; Hypertension in her father, mother, sister, and sister; Rheum arthritis in her mother; Stroke in her father.  Review of Systems  Constitutional: Negative.   Eyes: Negative.   Respiratory: Negative.   Gastrointestinal: Negative.   Musculoskeletal: Negative.   Neurological: Negative.   Hematological: Negative.   Psychiatric/Behavioral: Negative.   All other systems reviewed and are negative.   BP 130/60 mmHg  Pulse 79  Ht 5\' 2"  (1.575 m)  Wt 275 lb 1.9 oz (  124.794 kg)  BMI 50.31 kg/m2  SpO2 94%  Physical Exam  Constitutional: She is oriented to person, place, and time. She appears well-developed and well-nourished.  Obese  HENT:  Head: Normocephalic.  Nose: Nose normal.  Mouth/Throat: Oropharynx is clear and moist.  Eyes: Conjunctivae are normal. Pupils are equal, round, and reactive to light.  Neck: Normal range of motion. Neck supple. No JVD present.  Cardiovascular: Normal rate, regular rhythm, S1 normal, S2 normal, normal heart sounds and intact distal pulses.  Exam reveals no gallop and no friction rub.   No murmur heard. Pulmonary/Chest: Effort normal and breath sounds normal. No respiratory distress. She has no wheezes. She has no rales. She exhibits no tenderness.  Abdominal: Soft. Bowel sounds are normal. She exhibits no distension. There is no tenderness.  Musculoskeletal: Normal range of motion. She exhibits no edema or tenderness.  Lymphadenopathy:    She has no cervical adenopathy.  Neurological: She is alert and oriented to person, place, and time. Coordination normal.  Left side weakness arm and leg  Skin: Skin is warm and dry. No rash noted. No erythema.  Psychiatric: She has a normal mood and affect.  Her behavior is normal. Judgment and thought content normal.    Assessment and Plan  Nursing note and vitals reviewed.

## 2015-11-01 NOTE — Patient Instructions (Signed)
You are doing well. No medication changes were made.  Please call us if you have new issues that need to be addressed before your next appt.  Your physician wants you to follow-up in: 12 months.  You will receive a reminder letter in the mail two months in advance. If you don't receive a letter, please call our office to schedule the follow-up appointment. 
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
## 2015-11-01 NOTE — Assessment & Plan Note (Signed)
Cholesterol is at goal on the current lipid regimen. No changes to the medications were made.  

## 2015-11-01 NOTE — Assessment & Plan Note (Signed)
Prior history of poor control diabetes Stressed the importance of strict diet control, low carbohydrates

## 2015-11-01 NOTE — Assessment & Plan Note (Signed)
Blood pressure is well controlled on today's visit. No changes made to the medications. 

## 2015-11-01 NOTE — Assessment & Plan Note (Signed)
She denies any chest pain concerning for angina on today's visit No further workup at this time

## 2015-11-03 ENCOUNTER — Ambulatory Visit: Payer: Self-pay | Admitting: Urology

## 2015-11-12 ENCOUNTER — Encounter: Payer: Self-pay | Admitting: Urology

## 2015-11-12 ENCOUNTER — Ambulatory Visit (INDEPENDENT_AMBULATORY_CARE_PROVIDER_SITE_OTHER): Payer: Commercial Managed Care - HMO | Admitting: Urology

## 2015-11-12 ENCOUNTER — Ambulatory Visit
Admission: RE | Admit: 2015-11-12 | Discharge: 2015-11-12 | Disposition: A | Discharge status: Home / Self Care | Payer: Commercial Managed Care - HMO | Source: Ambulatory Visit | Attending: Urology | Admitting: Urology

## 2015-11-12 VITALS — BP 165/82 | HR 99 | Ht 62.0 in | Wt 274.5 lb

## 2015-11-12 DIAGNOSIS — N952 Postmenopausal atrophic vaginitis: Secondary | ICD-10-CM

## 2015-11-12 DIAGNOSIS — N3941 Urge incontinence: Secondary | ICD-10-CM | POA: Diagnosis not present

## 2015-11-12 DIAGNOSIS — N390 Urinary tract infection, site not specified: Secondary | ICD-10-CM

## 2015-11-12 LAB — MICROSCOPIC EXAMINATION

## 2015-11-12 LAB — URINALYSIS, COMPLETE
BILIRUBIN UA: NEGATIVE
GLUCOSE, UA: NEGATIVE
KETONES UA: NEGATIVE
Nitrite, UA: NEGATIVE
PH UA: 5 (ref 5.0–7.5)
SPEC GRAV UA: 1.025 (ref 1.005–1.030)
UUROB: 0.2 mg/dL (ref 0.2–1.0)

## 2015-11-12 NOTE — Progress Notes (Addendum)
11/12/2015 1:05 PM   Susan House 1955-08-04 [id number]  Referring provider: Jerrol Banana., MD [address] [place], [place] [address]  Chief Complaint  Patient presents with  . Recurrent UTI    allscripts 06/2014    HPI: 60 yo F with recurrent UTIs.    5-6 UTIs over last 2-3 years.  Associated symptoms include pain with urination, cloudy urine.  No fevers with infections.  Review of urine culture data reveals MSSA, Kelbsiella, Proteus, E. Coli over past year with variable sensitivity pattern.   Baseline urinary symptoms include incontitnence following stroke 2000.  Previous PVRs minimal.  Personal history of DM, A1C fairly well controlled recently but poor control in the past.  No personal history of passing kidney stones.  + FH of stones.   CT abd w/o contrast in 05/2014 showed 2 mm LLP nonobstructing calculus.  Asymptomatic.    Last cysto 2015 unremarkable.    Today, she denies any dysuria, gross hematuria, or any other symptoms. Urine clear today.  She is post menopausal.  s/p TAH/ BSO for PCOS.    No currently sexually active.  No issues with vaginal dryness.  Uses rare premarin cream, once every 2-3 weeks.    Today, she denies any dysuria, gross hematuria, or any other symptoms. Urine clear today.  PMH: Past Medical History  Diagnosis Date  . Cellulitis and abscess of face   . Allergy   . Depression   . Hypertension   . Edema   . Arthritis   . Migraine   . GERD (gastroesophageal reflux disease)   . Stroke ([place]) 2000  . Diabetes mellitus ([place])   . Hyperlipidemia   . Morbid obesity ([place])   . IBS (irritable bowel syndrome)   . Urinary incontinence   . History of nuclear stress test     a. 12/2009: lexiscan - negative  . TIA (transient ischemic attack) 2010  . History of echocardiogram     2008: normal, 2011: normal LVSF  . Hematuria   . Nocturia   . Urgency of micturation   . Urinary frequency     Surgical History: Past  Surgical History  Procedure Laterality Date  . Colonoscopy  2010  . Appendectomy    . Hernia repair    . Eye surgery    . Upper gi endoscopy    . Carpel tunnel surgery    . Back surgery      extensive spinal fusion  . Shoulder surgery    . Wrist fracture surgery Left   . Abdominoplasty    . Toe surgery    . Abdominal hysterectomy    . Cataract extraction      x 2  . Cholecystectomy    . Cesarean section      Home Medications:    Medication List       This list is accurate as of: 11/12/15 11:59 PM.  Always use your most recent med list.               allopurinol 100 MG tablet  Commonly known as:  ZYLOPRIM  Take 1 tablet (100 mg total) by mouth daily.     amLODipine 5 MG tablet  Commonly known as:  NORVASC  TAKE 1 TABLET EVERY DAY     aspirin 325 MG tablet  Take 325 mg by mouth daily.     atorvastatin 20 MG tablet  Commonly known as:  LIPITOR  Take 1 tablet (20 mg total)  by mouth at bedtime.     baclofen 20 MG tablet  Commonly known as:  LIORESAL  Take 20 mg by mouth 2 (two) times daily.     clopidogrel 75 MG tablet  Commonly known as:  PLAVIX  Take 1 tablet (75 mg total) by mouth daily.     colchicine 0.6 MG tablet  Take 1 tablet (0.6 mg total) by mouth 2 (two) times daily.     enalapril 20 MG tablet  Commonly known as:  VASOTEC  Take 20 mg by mouth 2 (two) times daily.     gabapentin 100 MG capsule  Commonly known as:  NEURONTIN  Take 1 capsule (100 mg total) by mouth 3 (three) times daily.     glucose blood test strip  1 each by Other route daily. Check sugar three times daily, DX:E11.9     hydrochlorothiazide 25 MG tablet  Commonly known as:  HYDRODIURIL  TAKE 1 TABLET EVERY DAY     HYDROcodone-acetaminophen 10-325 MG tablet  Commonly known as:  NORCO  Take 1-2 tablets by mouth every 4 (four) hours as needed.     insulin aspart protamine- aspart (70-30) 100 UNIT/ML injection  Commonly known as:  NOVOLOG MIX 70/30  Inject 25-30 Units into  the skin 2 (two) times daily.     magnesium oxide 400 (241.3 Mg) MG tablet  Commonly known as:  MAG-OX  Take 1 tablet (400 mg total) by mouth daily.     metFORMIN 1000 MG tablet  Commonly known as:  GLUCOPHAGE  TAKE 1 TABLET TWICE DAILY     nitroGLYCERIN 0.4 MG SL tablet  Commonly known as:  NITROSTAT  Place 1 tablet (0.4 mg total) under the tongue every 5 (five) minutes as needed for chest pain.     oxybutynin 5 MG 24 hr tablet  Commonly known as:  DITROPAN-XL  Take 1 tablet (5 mg total) by mouth daily.     pantoprazole 40 MG tablet  Commonly known as:  PROTONIX  Take 1 tablet (40 mg total) by mouth daily.     sulfamethoxazole-trimethoprim 800-160 MG tablet  Commonly known as:  BACTRIM DS,SEPTRA DS  Take 1 tablet by mouth 2 (two) times daily.        Allergies:  Allergies  Allergen Reactions  . Morphine And Related Anaphylaxis  . Betadine [Povidone Iodine]   . Fentanyl   . Iodine   . Morphine And Related   . Reglan [Metoclopramide]   . Simvastatin     Muscle pain  . Betadine [Povidone Iodine] Rash  . Tetracyclines & Related Rash  . Tetracyclines & Related Rash    Family History: Family History  Problem Relation Age of Onset  . Hyperlipidemia Mother   . Arrhythmia Mother     WPW  . Heart attack Father 104  . Hyperlipidemia Father   . Hypertension Father   . Hypertension Mother   . Rheum arthritis Mother   . Heart disease Mother   . Stroke Father   . Diabetes Father   . Heart disease Father   . Coronary artery disease Father   . Diabetes Sister   . Hyperlipidemia Sister   . Hypertension Sister   . Depression Sister   . Depression Sister   . Diabetes Sister   . Hypertension Sister   . Kidney disease Paternal Grandmother     Social History:  reports that she has never smoked. She has never used smokeless tobacco. She reports that she does not  drink alcohol or use illicit drugs.  ROS: UROLOGY Frequent Urination?: Yes Hard to postpone urination?:  Yes Burning/pain with urination?: No Get up at night to urinate?: Yes Leakage of urine?: Yes Urine stream starts and stops?: No Trouble starting stream?: No Do you have to strain to urinate?: No Blood in urine?: No Urinary tract infection?: No Sexually transmitted disease?: No Injury to kidneys or bladder?: No Painful intercourse?: No Weak stream?: No Currently pregnant?: No Vaginal bleeding?: No Last menstrual period?: n  Gastrointestinal Nausea?: No Vomiting?: No Indigestion/heartburn?: No Diarrhea?: No Constipation?: No  Constitutional Fever: No Night sweats?: No Weight loss?: No Fatigue?: No  Skin Skin rash/lesions?: No Itching?: No  Eyes Blurred vision?: No Double vision?: No  Ears/Nose/Throat Sore throat?: No Sinus problems?: No  Hematologic/Lymphatic Swollen glands?: No Easy bruising?: No  Cardiovascular Leg swelling?: No Chest pain?: No  Respiratory Cough?: No Shortness of breath?: No  Endocrine Excessive thirst?: No  Musculoskeletal Back pain?: No Joint pain?: No  Neurological Headaches?: No Dizziness?: No  Psychologic Depression?: No Anxiety?: No  Physical Exam: BP 165/82 mmHg  Pulse 99  Ht 5\' 2"  (1.575 m)  Wt 274 lb 8 oz (124.512 kg)  BMI 50.19 kg/m2  Constitutional:  Alert and oriented, No acute distress.  Presents with husband today. HEENT: [place] AT, moist mucus membranes.  Trachea midline, no masses. Cardiovascular: No clubbing, cyanosis, or edema. Respiratory: Normal respiratory effort, no increased work of breathing. GI: Abdomen is soft, nontender, nondistended, no abdominal masses.  Morbidly obese.   GU: No CVA tenderness.  Skin: No rashes, bruises or suspicious lesions. Lymph: No cervical or inguinal adenopathy. Neurologic: Grossly intact, no focal deficits, moving all 4 extremities. Psychiatric: Normal mood and affect.  Laboratory Data: Lab Results  Component Value Date   WBC 5.9 09/01/2015   HGB 11.9*  07/22/2015   HCT 35.2 09/01/2015   MCV 85 09/01/2015   PLT 266 09/01/2015    Lab Results  Component Value Date   CREATININE 0.88 07/28/2015     Lab Results  Component Value Date   HGBA1C 9.6* 01/17/2014   Reports better more recently, between 6-7.   Urinalysis Results for orders placed or performed in visit on 11/12/15  Microscopic Examination  Result Value Ref Range   WBC, UA 11-30 (A) 0 -  5 /hpf   RBC, UA 0-2 0 -  2 /hpf   Epithelial Cells (non renal) 0-10 0 - 10 /hpf   Bacteria, UA Few None seen/Few  Urinalysis, Complete  Result Value Ref Range   Specific Gravity, UA 1.025 1.005 - 1.030   pH, UA 5.0 5.0 - 7.5   Color, UA Yellow Yellow   Appearance Ur Clear Clear   Leukocytes, UA Trace (A) Negative   Protein, UA 2+ (A) Negative/Trace   Glucose, UA Negative Negative   Ketones, UA Negative Negative   RBC, UA Trace (A) Negative   Bilirubin, UA Negative Negative   Urobilinogen, Ur 0.2 0.2 - 1.0 mg/dL   Nitrite, UA Negative Negative   Microscopic Examination See below:     Pertinent Imaging: Previous CT san abd/ pelvis 05/2014 reviewed 2 mm LLP stone   Assessment & Plan:    1. Recurrent UTI Lengthy discussion today discussing pathophysiology of recurrent urinary tract infections.  Discussed hygiene issues today at length as well as importance of adequate fluid intake as well as weight loss in excellent glycemic control.  I have recommended probiotics and Estrace cream in addition to the above as  UTI prevention.  Antibiotic stewardship was discussed at length today as well as her variable bacterial growth and resistance patterns. I hesitate to start suppressive antibiotics at this time. May consider in the future if deemed necessary.  Recommend KUB today given history of nonobstructing kidney stone to assess its size and consideration of treatment of the stone in the setting of recurrent infections.  - Urinalysis, Complete - DG Abd 1 View; Future  2.  Atrophic vaginitis History of atrophic vaginitis, on estrogen cream. Discussed the importance of denies tissue and its relationship to recurrent urinary tract infections. I would recommend using a pea-sized knot per urethra 3 times a week daily at bedtime. We discussed the anatomy and how to apply the medication. She will resume this medication.  3. Urge incontinence of urine Stable, baseline since stroke.     Return in about 6 months (around 05/13/2016) for recheck UTIs.  Hollice Espy, MD  [facility] [address], [place], [place] [address] [phone]

## 2015-11-14 ENCOUNTER — Encounter: Payer: Self-pay | Admitting: Urology

## 2015-11-15 ENCOUNTER — Telehealth: Payer: Self-pay

## 2015-11-15 NOTE — Telephone Encounter (Signed)
LMOM

## 2015-11-15 NOTE — Telephone Encounter (Signed)
-----   Message from Hollice Espy, MD sent at 11/12/2015  5:04 PM EDT ----- Please let [name] know that she has no obvious stones on her x-ray. The tiny stone that was seen on previous imaging has either likely passed or too small to see on plain film x-ray. This is not likely contributing to her UTIs.  Hollice Espy, MD

## 2015-11-15 NOTE — Telephone Encounter (Signed)
Spoke with pt in reference to xray results and UTIs. Pt voiced understanding.

## 2015-11-16 IMAGING — CT CT HEAD WITHOUT CONTRAST
2 series · 16 of 30 positions shown, 20 images · non-contrast
Comparison: Noncontrast CT scan of brain October 19, 2012

CLINICAL DATA: Altered mental status and confusion

EXAM:
CT HEAD WITHOUT CONTRAST
TECHNIQUE: Contiguous axial images were obtained from the base of the skull
through the vertex without intravenous contrast.

[Series 2: head wo · axial · 0.43mm/px · z∈[-210,-90]mm · 13 of 30 slices shown, 17 images]
[im 3/30  brain]
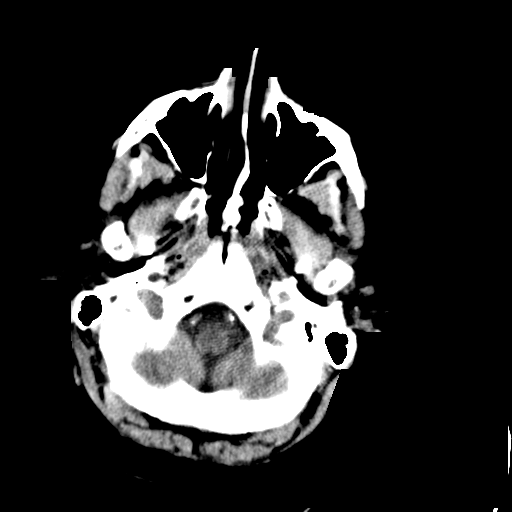
[im 3/30  bone]
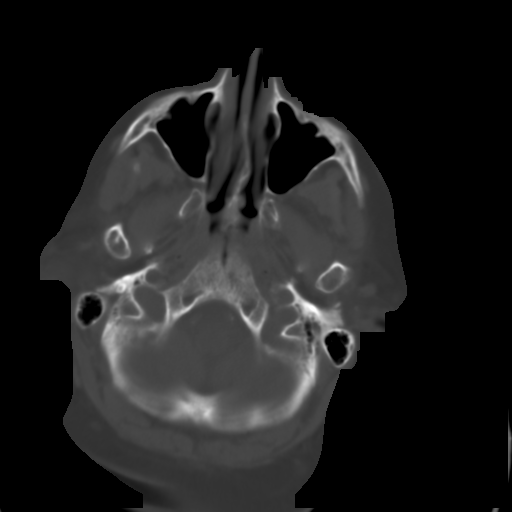
[im 5/30  brain]
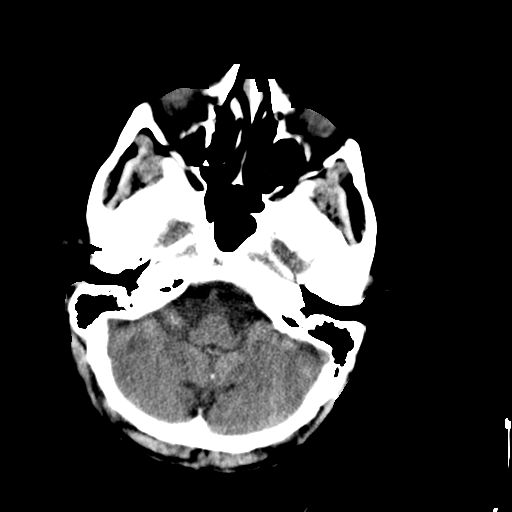
[im 7/30  brain]
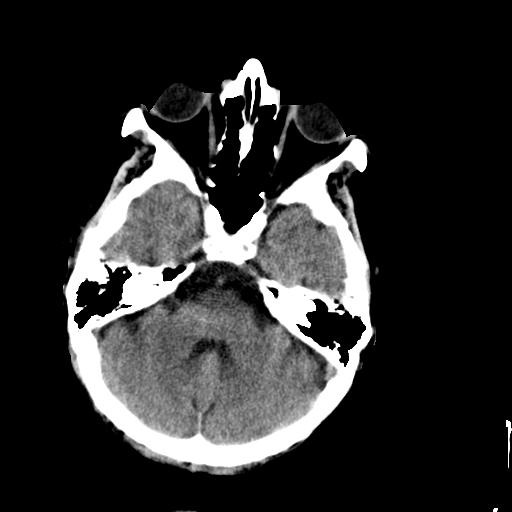
[im 9/30  brain]
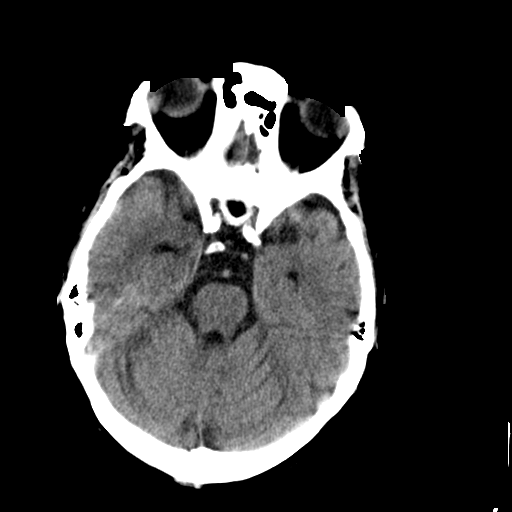
[im 11/30  brain]
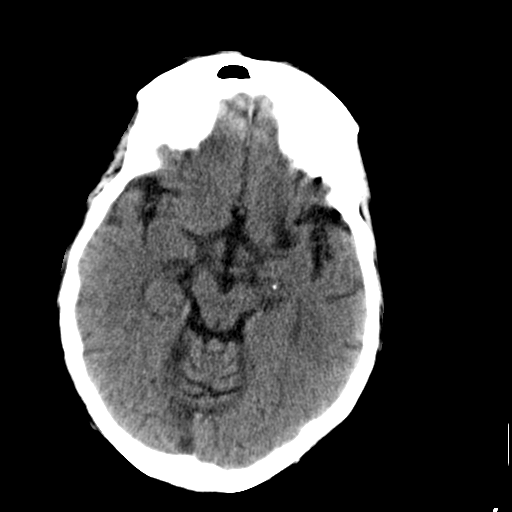
[im 11/30  bone]
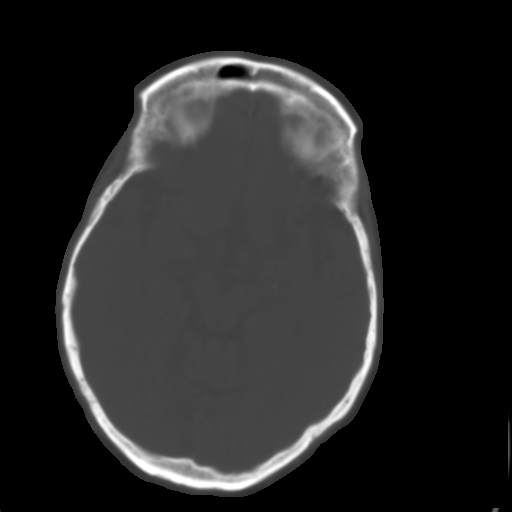
[im 13/30  brain]
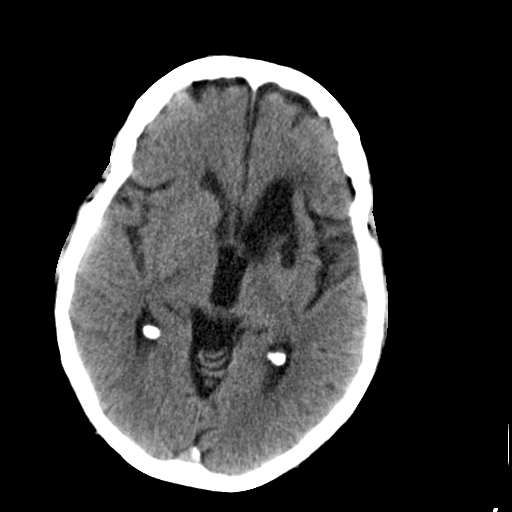
[im 15/30  brain]
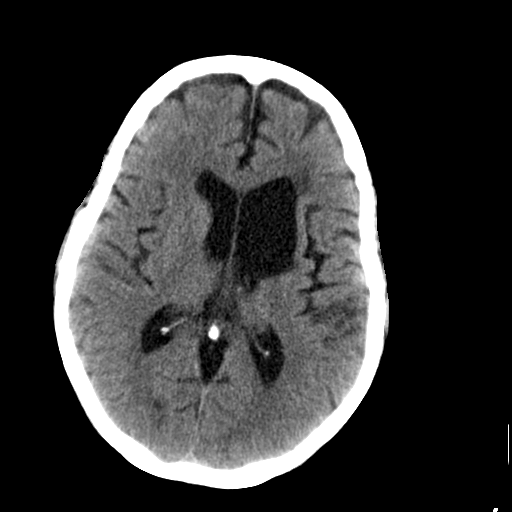
[im 17/30  brain]
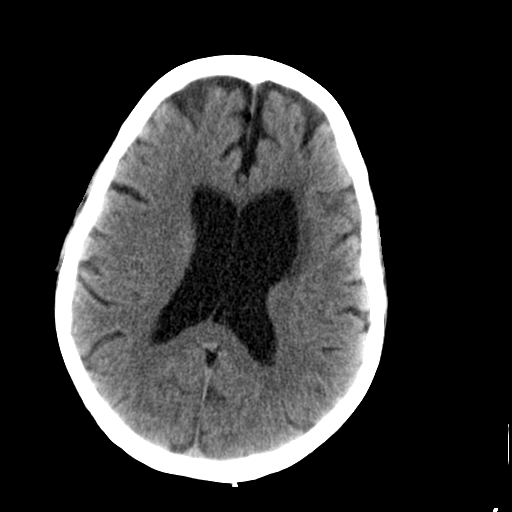
[im 19/30  brain]
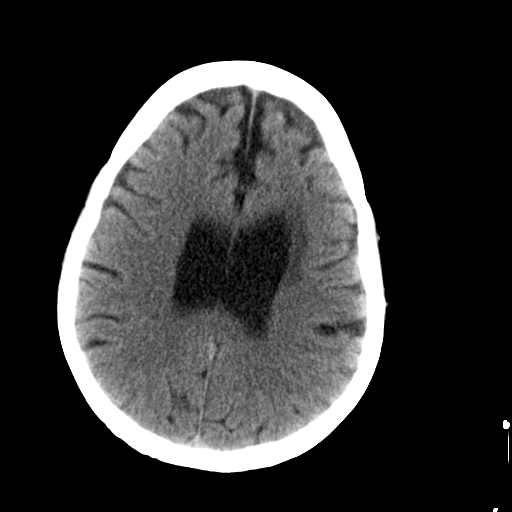
[im 19/30  bone]
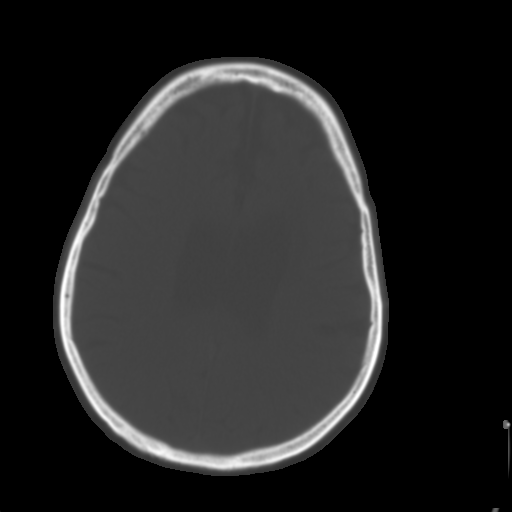
[im 21/30  brain]
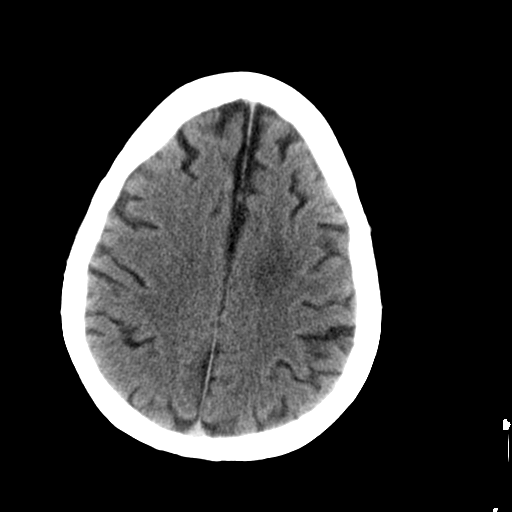
[im 23/30  brain]
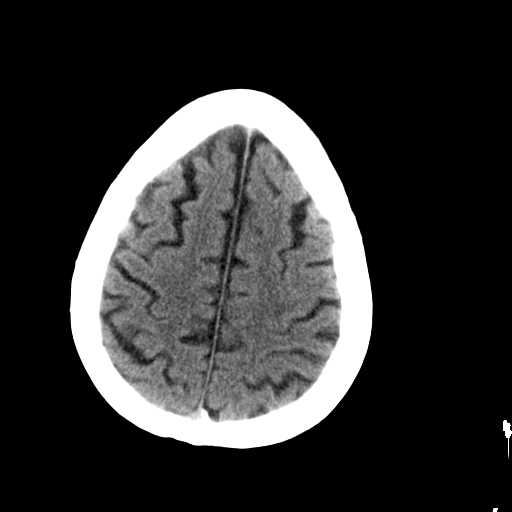
[im 25/30  brain]
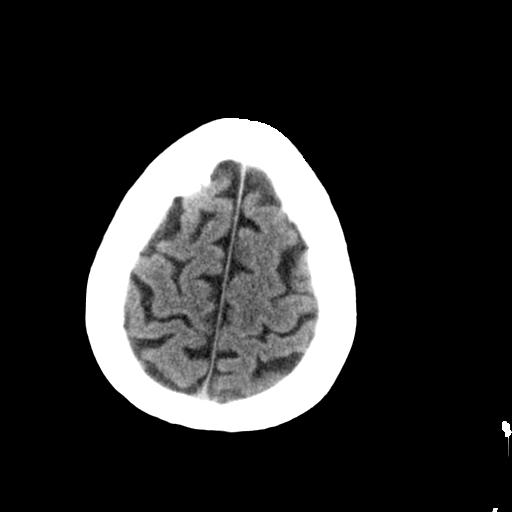
[im 27/30  brain]
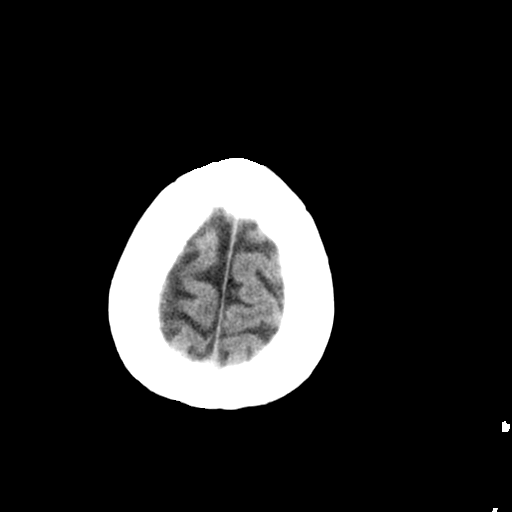
[im 27/30  bone]
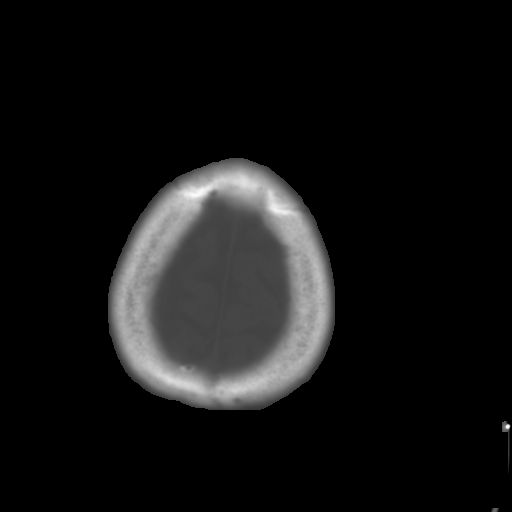

[Series 4: head wo2 · axial · 0.43mm/px · z∈[-179,-141]mm · 3 of 31 slices shown]
[im 3/31  brain]
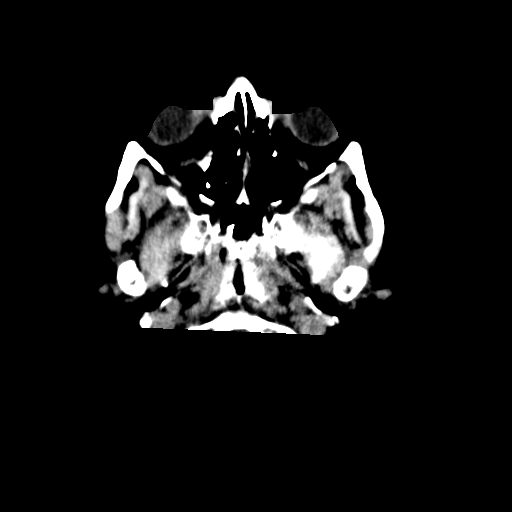
[im 7/31  brain]
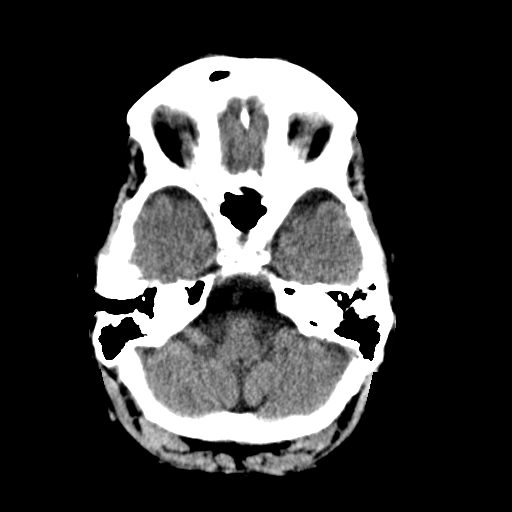
[im 11/31  brain]
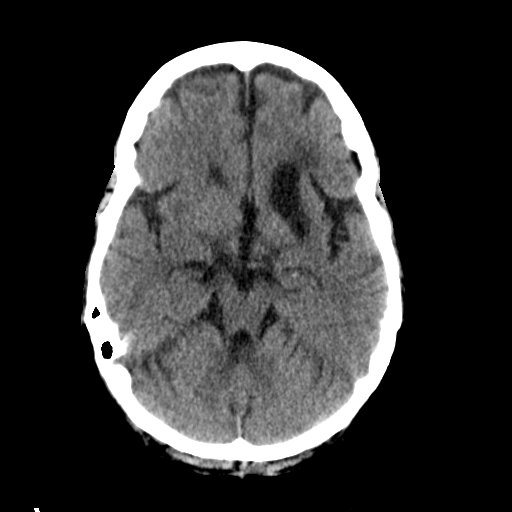

[16 of 30 positions shown; findings below may reference images not displayed]

FINDINGS: There is chronic ex vacuo dilation of the frontal horn of the left
lateral ventricle due to areas of encephalomalacia in the deep left
frontal white matter and the adjacent left basal ganglia. There is
no shift of the midline. There is no intracranial hemorrhage nor
acute ischemic change. There is very mild diffuse cerebral atrophy.
The cerebellum is normal.

The paranasal sinuses and mastoid air cells are clear. There is no
skull fracture nor lytic or blastic skull lesion.
IMPRESSION: There is no acute intracranial abnormality. Stable chronic changes
in the left frontal lobe and basal ganglia are demonstrated.

## 2015-11-16 IMAGING — CR DG CHEST 1V PORT
1 series · 1 of 1 positions shown · non-contrast
Comparison: 09/29/2011

CLINICAL DATA: Hypertension

EXAM:
PORTABLE CHEST - 1 VIEW

[ap]
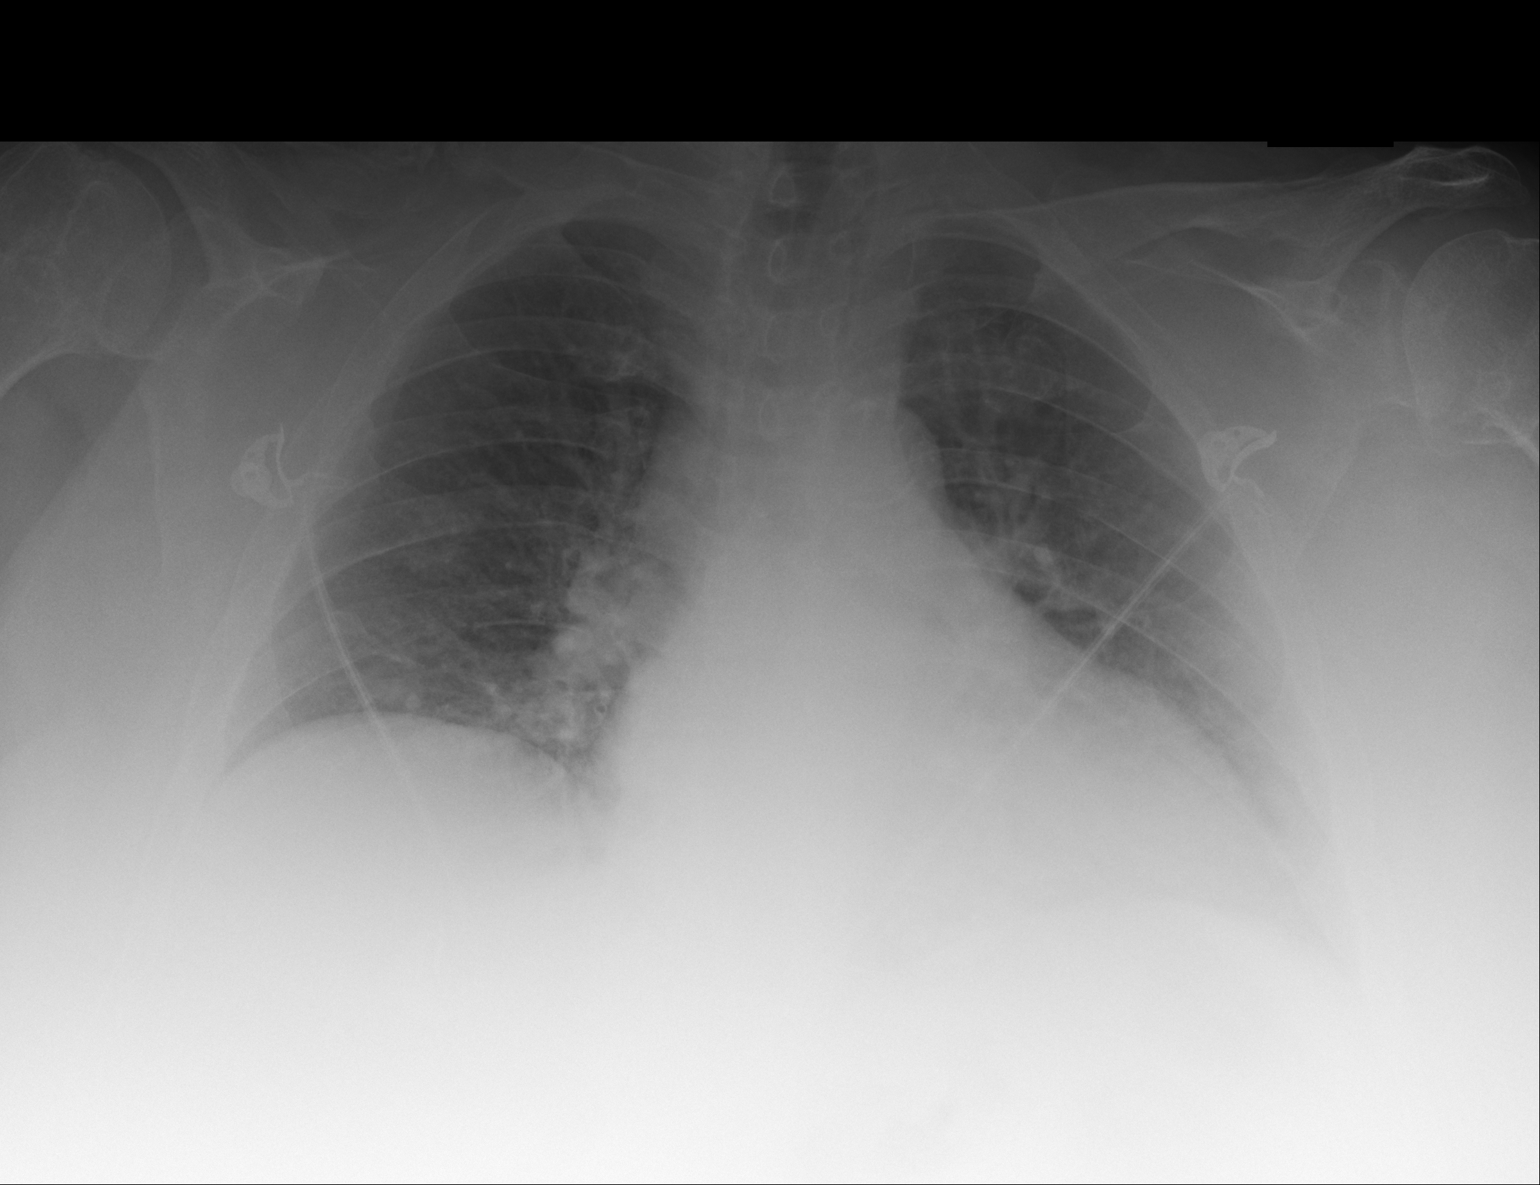

[1 of 1 positions shown; findings below may reference images not displayed]

FINDINGS: The cardiac shadow is mildly enlarged but stable. The overall
inspiratory effort is poor with mild bibasilar atelectasis. No focal
confluent infiltrate is seen. A vague density is noted in the right
mid lung related to the anterior aspect of the right third rib. No
other focal abnormality is seen.
IMPRESSION: Mild bibasilar atelectasis. PA and lateral chest x-ray would be
beneficial when clinically able.

## 2015-11-17 IMAGING — MR MRI HEAD WITHOUT CONTRAST
10 of 11 series · 43 of 48 positions shown · non-contrast
Comparison: Head CT 01/18/2014.  MRI 05/04/2009.

CLINICAL DATA: Stroke.  Slurred speech.

EXAM:
MRI HEAD WITHOUT CONTRAST
TECHNIQUE: Multiplanar, multiecho pulse sequences of the brain and surrounding
structures were obtained without intravenous contrast.

[Series 4: DWI · axial · 5.0mm · 1.80mm/px · z∈[-66,+89]mm · 4 of 26 slices shown (1 of 4)]
[im 1/26]
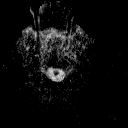
[im 9/26]
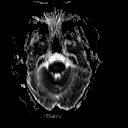
[im 17/26]
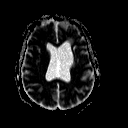
[im 26/26]
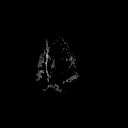

[Series 6: DWI · coronal · 5.0mm · 1.80mm/px · 6 of 37 slices shown (2 of 4)]
[im 1/37]
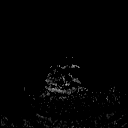
[im 8/37]
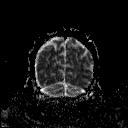
[im 15/37]
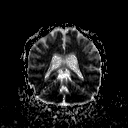
[im 22/37]
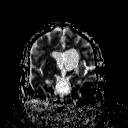
[im 29/37]
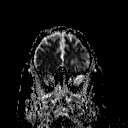
[im 37/37]
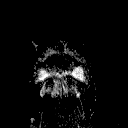

[Series 8: FLAIR · axial · 5.0mm · 0.45mm/px · z∈[-66,+89]mm · 4 of 26 slices shown (1 of 2)]
[im 1/26]
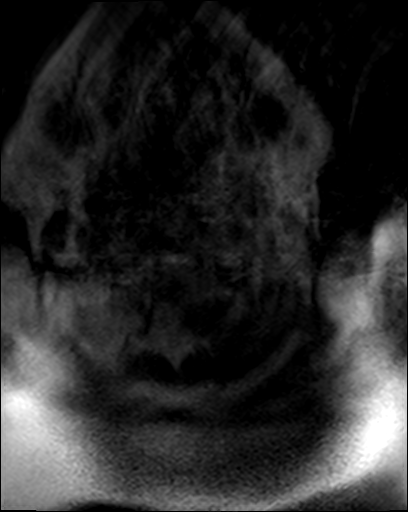
[im 9/26]
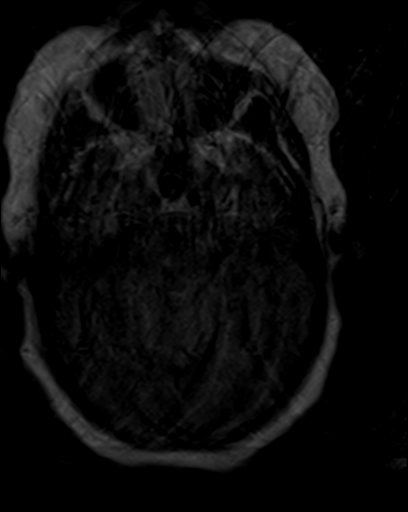
[im 17/26]
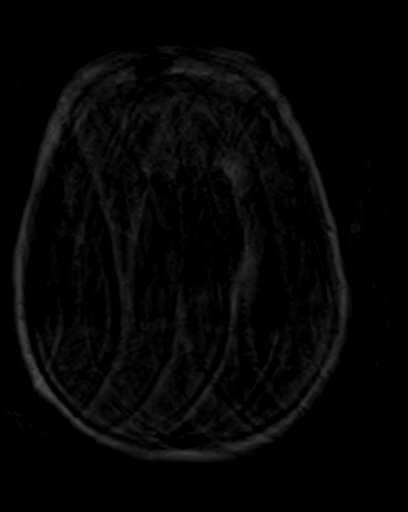
[im 26/26]
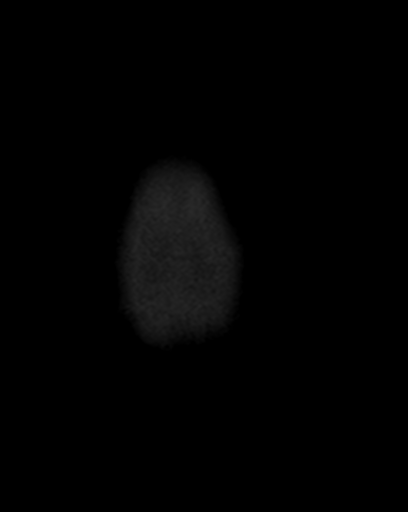

[Series 9: T2 · axial · 5.0mm · 0.45mm/px · z∈[-51,+101]mm · 4 of 26 slices shown (1 of 3)]
[im 1/26]
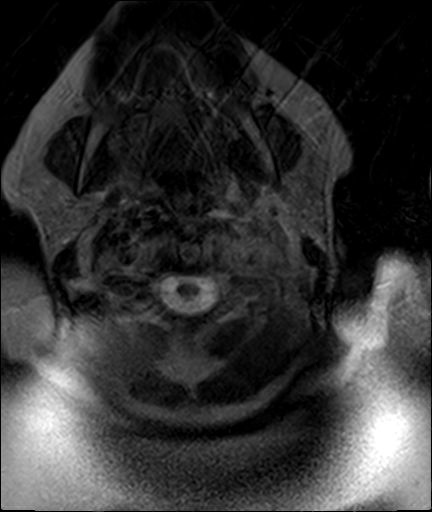
[im 9/26]
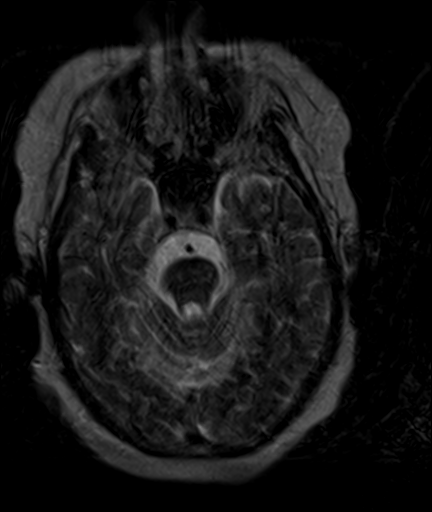
[im 17/26]
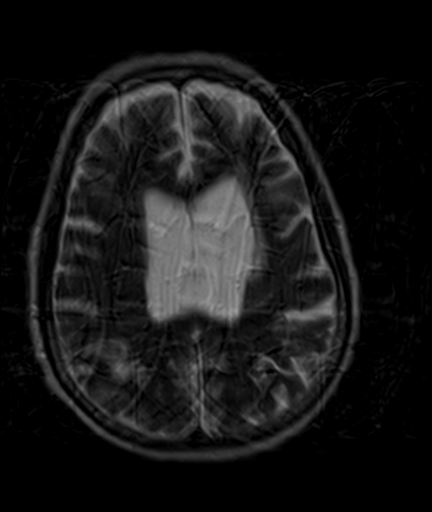
[im 26/26]
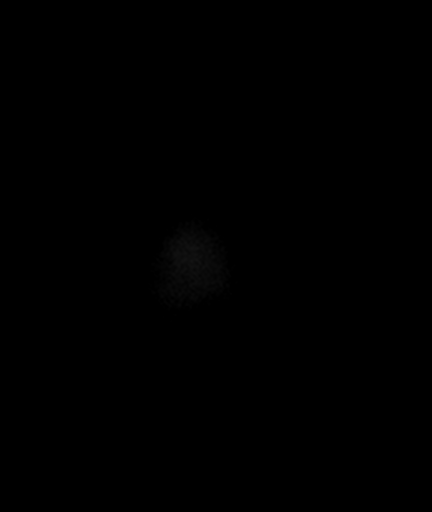

[Series 10: GRE · axial · 5.0mm · 0.45mm/px · z∈[-51,+46]mm · 3 of 26 slices shown]
[im 1/26]
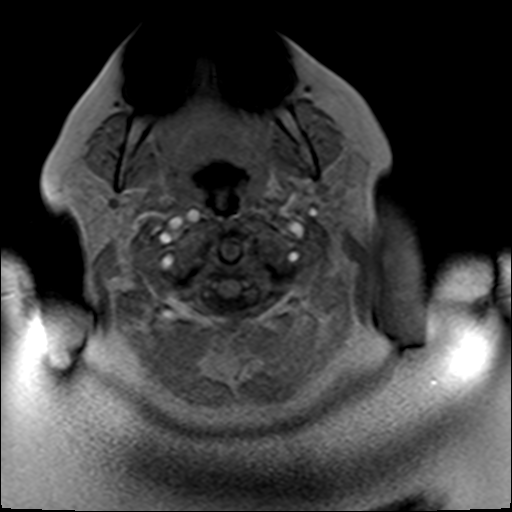
[im 9/26]
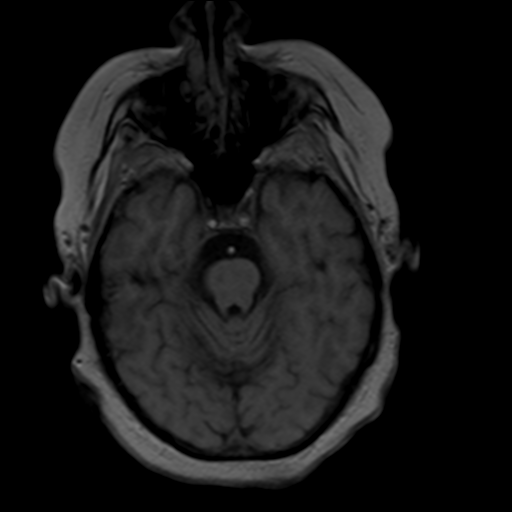
[im 17/26]
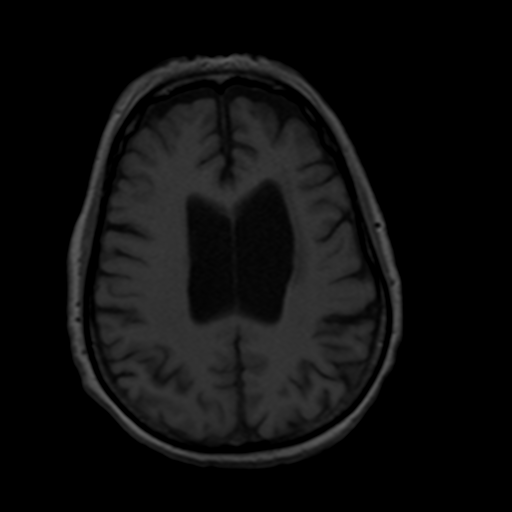

[Series 11: T2 · axial · 5.0mm · 1.20mm/px · z∈[-51,+101]mm · 4 of 26 slices shown (2 of 3)]
[im 1/26]
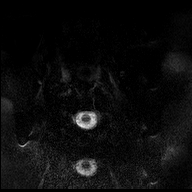
[im 9/26]
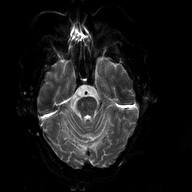
[im 17/26]
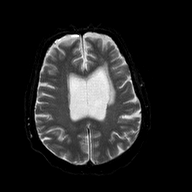
[im 26/26]
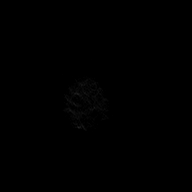

[Series 12: T2 · coronal · 5.0mm · 0.45mm/px · 4 of 29 slices shown (3 of 3)]
[im 1/29]
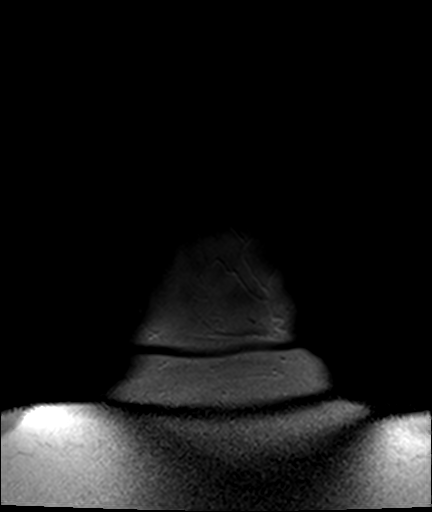
[im 10/29]
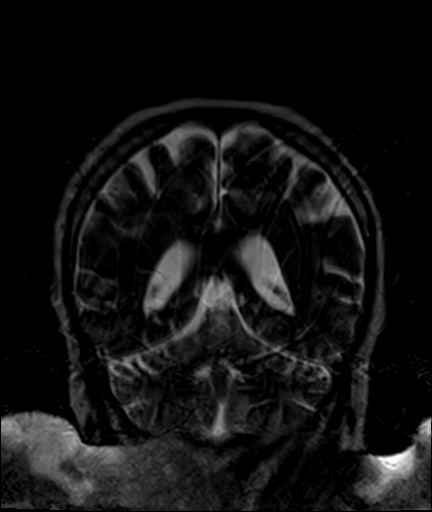
[im 19/29]
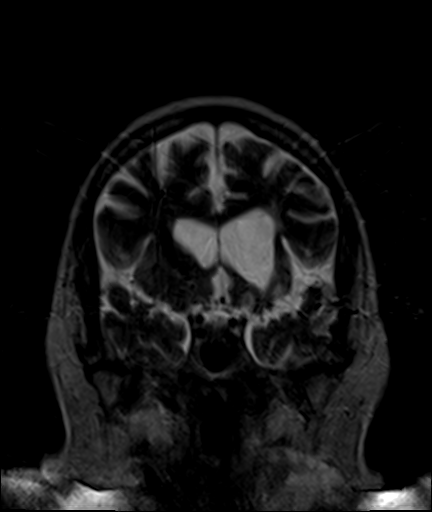
[im 29/29]
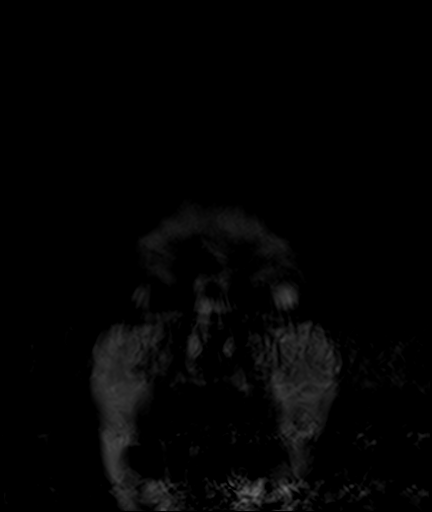

[Series 13: FLAIR · axial · 5.0mm · 0.45mm/px · z∈[-66,+89]mm · 4 of 26 slices shown (2 of 2)]
[im 1/26]
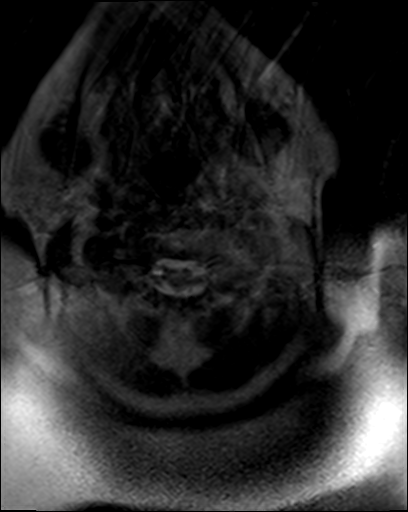
[im 9/26]
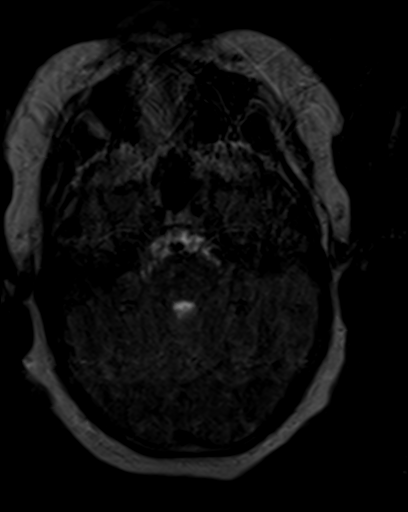
[im 17/26]
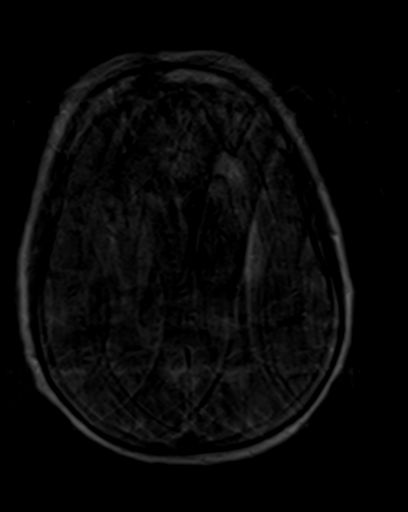
[im 26/26]
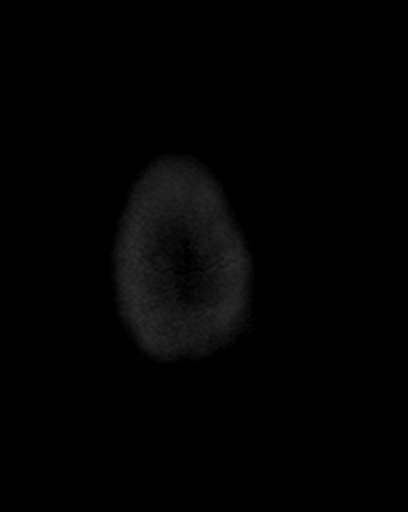

[Series 100: DWI · coronal · 5.0mm · 1.80mm/px · 6 of 37 slices shown (3 of 4)]
[im 1/37]
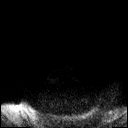
[im 8/37]
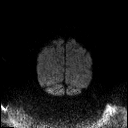
[im 15/37]
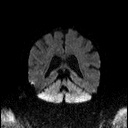
[im 22/37]
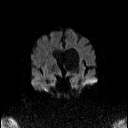
[im 29/37]
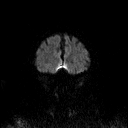
[im 37/37]
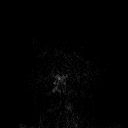

[Series 102: DWI · axial · 5.0mm · 1.80mm/px · z∈[-66,+89]mm · 4 of 26 slices shown (4 of 4)]
[im 1/26]
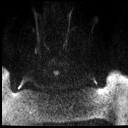
[im 9/26]
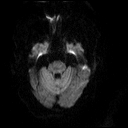
[im 17/26]
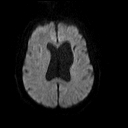
[im 26/26]
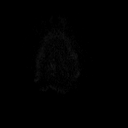

[43 of 48 positions shown; findings below may reference images not displayed]

FINDINGS: The examination is severely degraded by motion, despite the
technologist's best efforts. Diffusion imaging is of good quality
and does not show any acute or subacute infarction. There is some
chronic small-vessel changes within the pons and cerebellum. There
is an old left frontal deep brain infarction that also involves the
basal ganglia. This has progressed to atrophy, encephalomalacia and
adjacent gliosis. There are mild chronic small-vessel changes of the
hemispheric white matter elsewhere. No mass lesion, hemorrhage,
hydrocephalus or extra-axial collection. No pituitary mass. No
inflammatory sinus disease. No skull or skullbase lesion.
IMPRESSION: No acute finding.

Old infarction left frontal lobe and left basal ganglia.

## 2015-11-17 IMAGING — MR MRA HEAD WITHOUT CONTRAST
1 series · 23 of 48 positions shown · non-contrast
Comparison: Head CT yesterday.  MRI brain today.

CLINICAL DATA: Stroke.  Slurred speech.

EXAM:
MRA HEAD WITHOUT CONTRAST
TECHNIQUE: Angiographic images of the Circle of Willis were obtained using MRA
technique without intravenous contrast.

[Series 2: TOF · axial · non-contrast · 0.7mm · 0.37mm/px · z∈[-18,+59]mm · 23 of 130 slices shown]
[im 1/130]
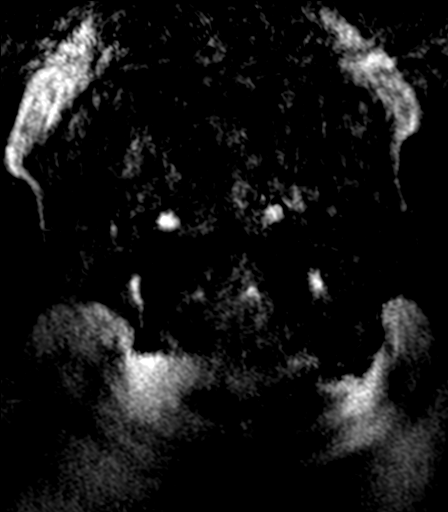
[im 3/130]
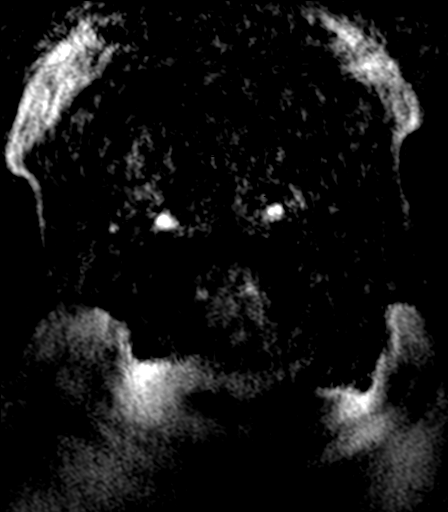
[im 6/130]
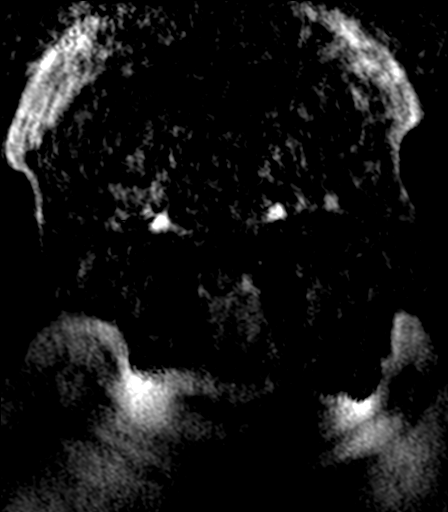
[im 9/130]
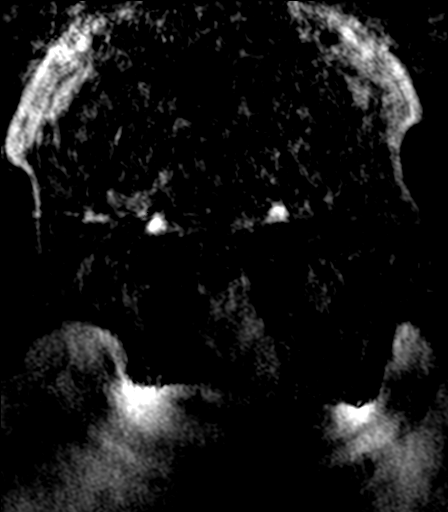
[im 11/130]
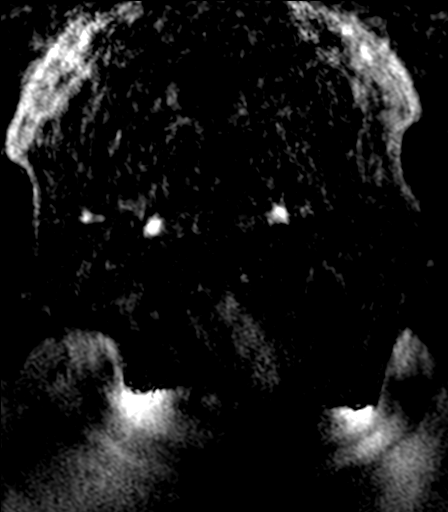
[im 14/130]
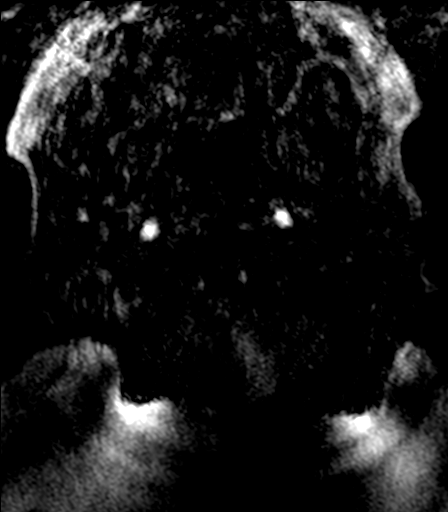
[im 17/130]
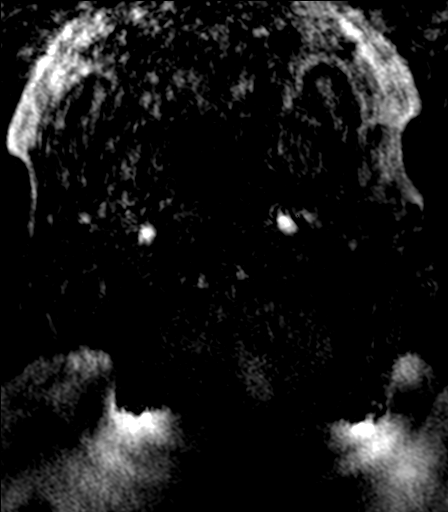
[im 20/130]
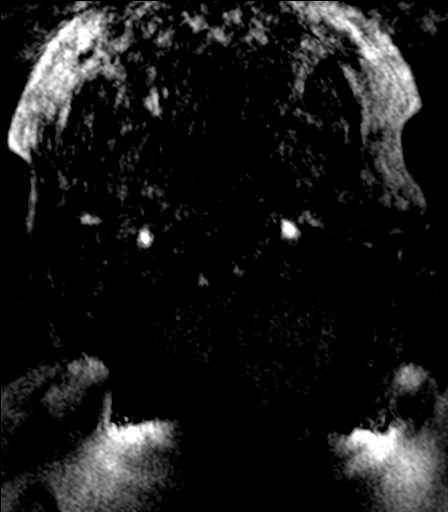
[im 22/130]
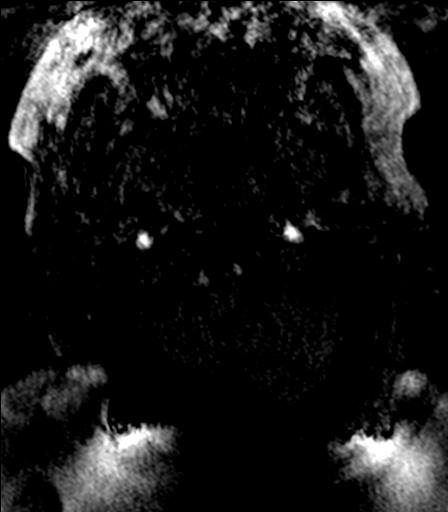
[im 25/130]
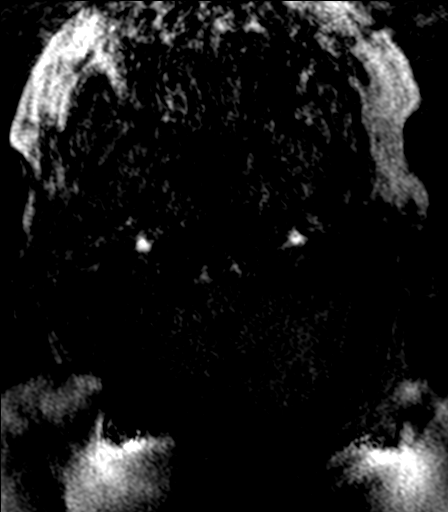
[im 28/130]
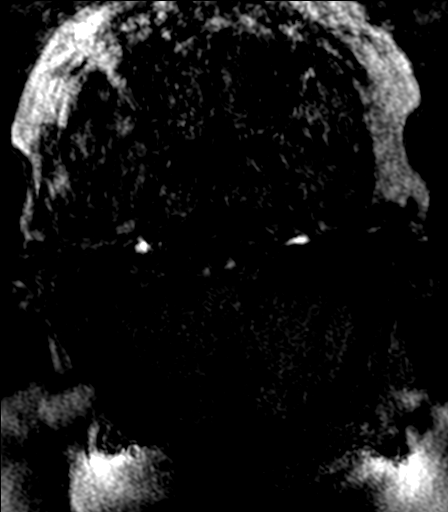
[im 31/130]
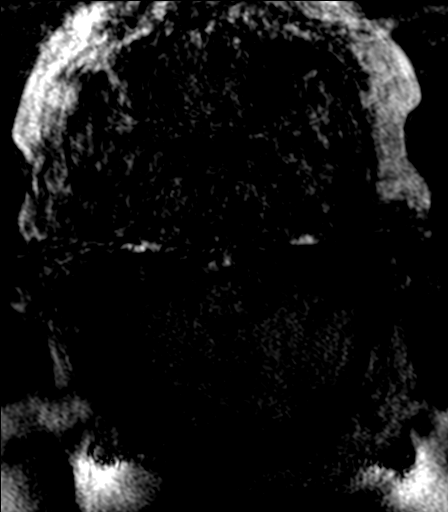
[im 33/130]
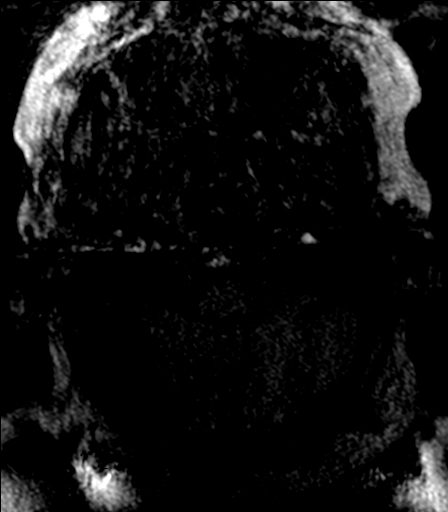
[im 36/130]
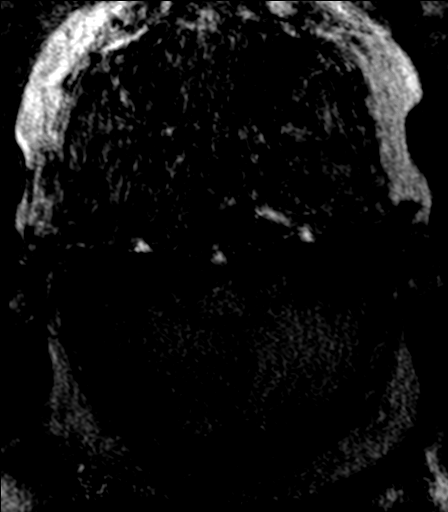
[im 39/130]
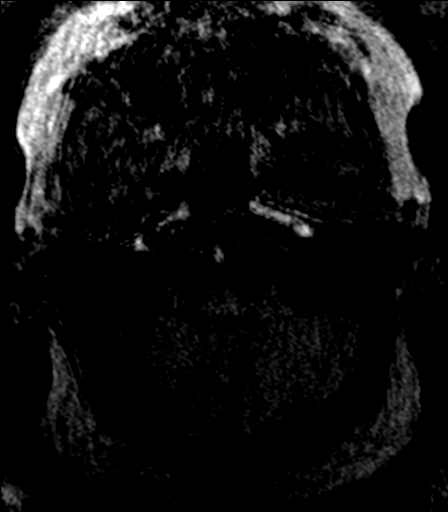
[im 42/130]
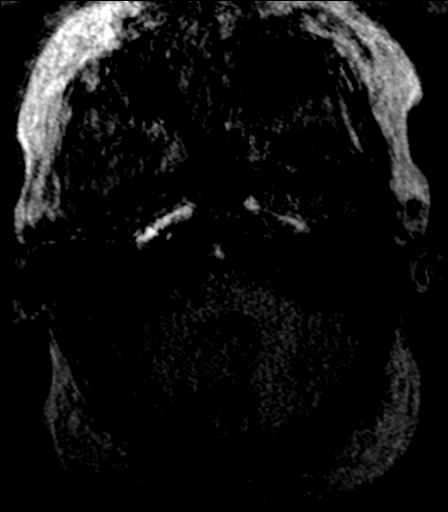
[im 58/130]
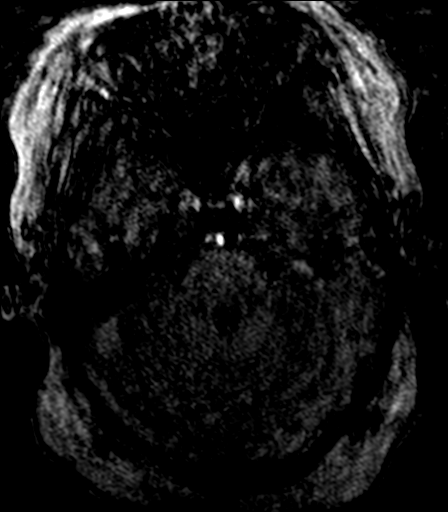
[im 66/130]
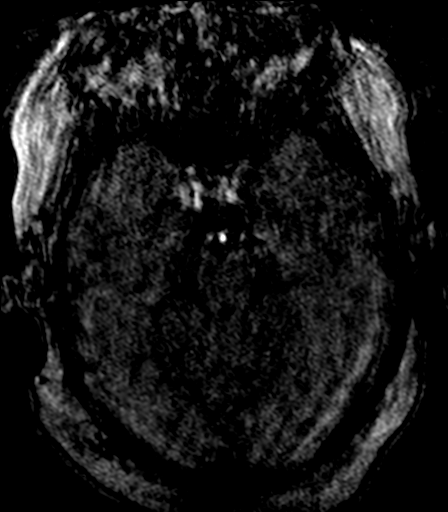
[im 75/130]
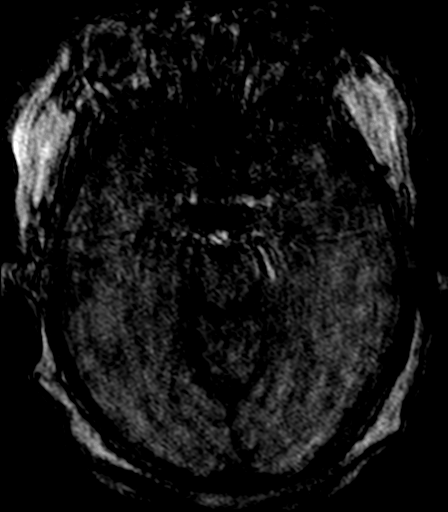
[im 91/130]
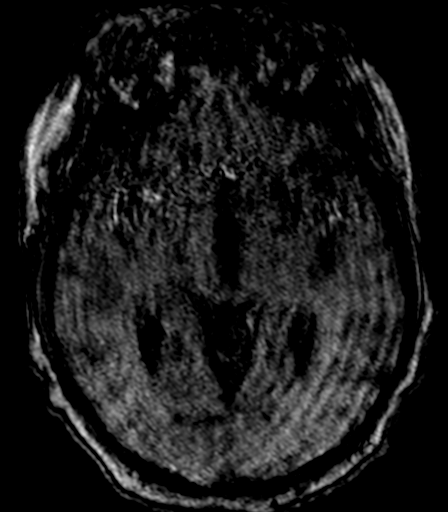
[im 108/130]
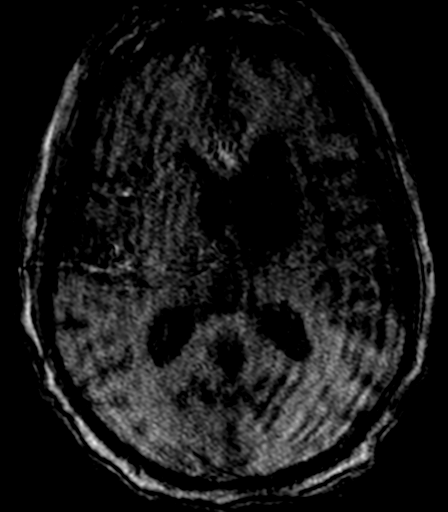
[im 110/130]
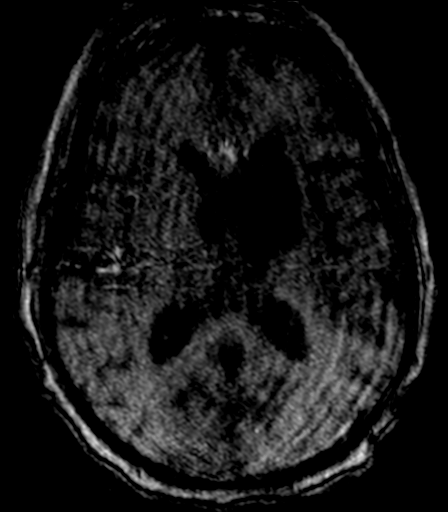
[im 124/130]
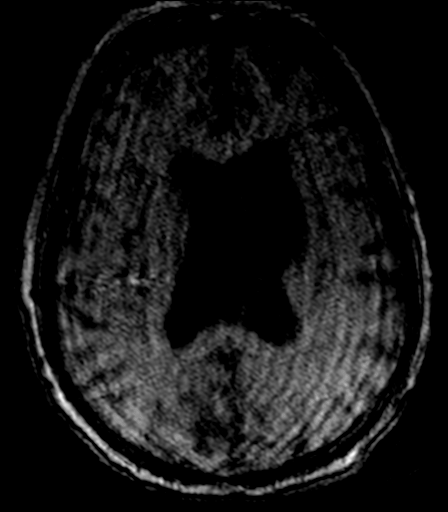

[23 of 48 positions shown; findings below may reference images not displayed]

FINDINGS: This study suffers from severe motion degradation. Both internal
carotid arteries show antegrade flow. There is flow a in both the
anterior and middle cerebral arteries. The vessels cannot be
evaluated for anatomic detail.

Both vertebral artery show flow. The basilar artery shows flow
without apparent stenosis. Vascular detail cannot be evaluated.
IMPRESSION: Severely motion degraded exam. All major vessels appear to show
flow. Anatomic detail is not possible to evaluate given the degree
of motion degradation.

## 2015-11-18 ENCOUNTER — Other Ambulatory Visit: Payer: Self-pay

## 2015-11-18 ENCOUNTER — Telehealth: Payer: Self-pay | Admitting: Family Medicine

## 2015-11-18 IMAGING — CT CT HEAD WITHOUT CONTRAST
2 of 3 series · 16 of 30 positions shown, 18 images · non-contrast
Comparison: MRI brain 01/18/2014.  CT head 01/17/2014.

CLINICAL DATA: Possible stroke. Different size pupils starting
tonight.

EXAM:
CT HEAD WITHOUT CONTRAST
TECHNIQUE: Contiguous axial images were obtained from the base of the skull
through the vertex without intravenous contrast.

[Series 2: soft tissue · axial · 0.42mm/px · z∈[-92,+28]mm · 8 of 32 slices shown, 10 images]
[im 4/32  brain]
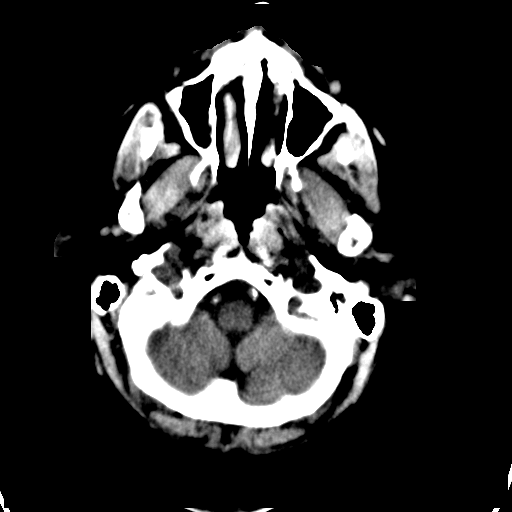
[im 4/32  bone]
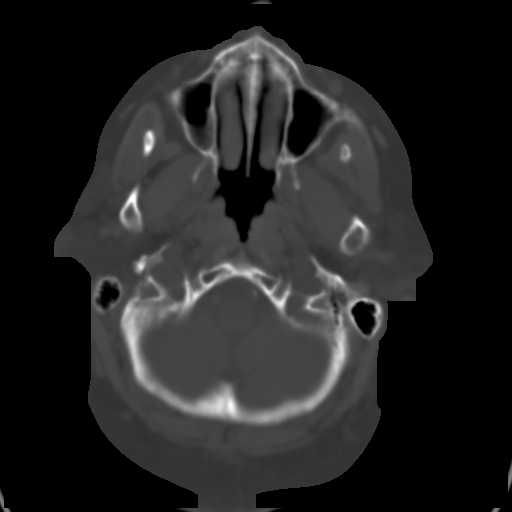
[im 7/32  brain]
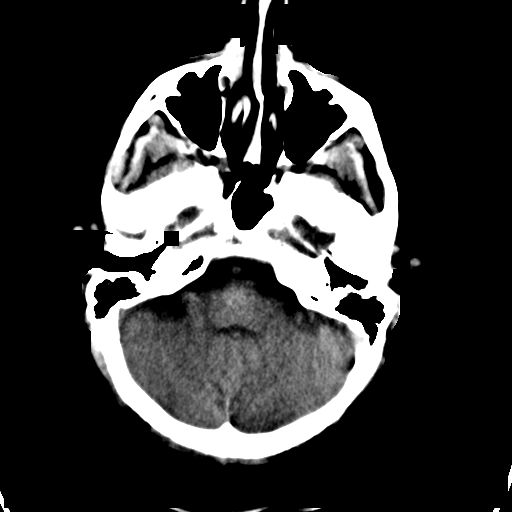
[im 11/32  brain]
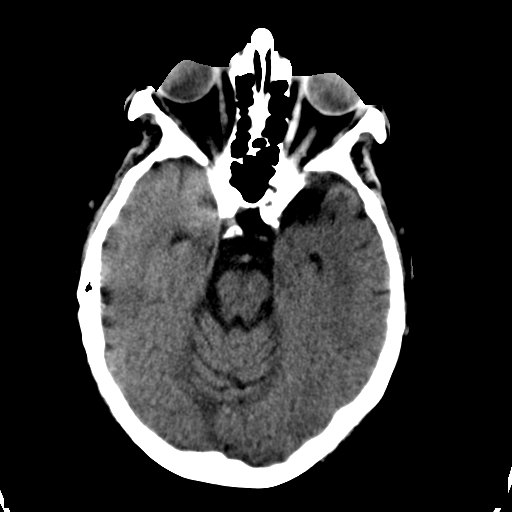
[im 14/32  brain]
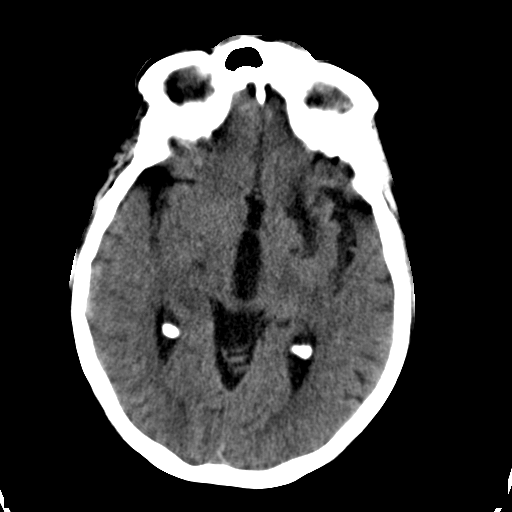
[im 18/32  brain]
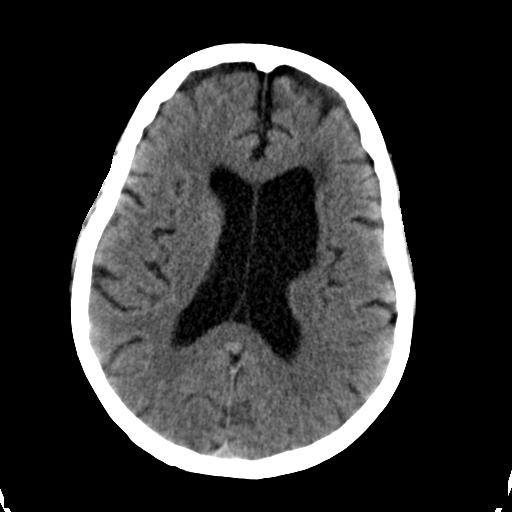
[im 18/32  bone]
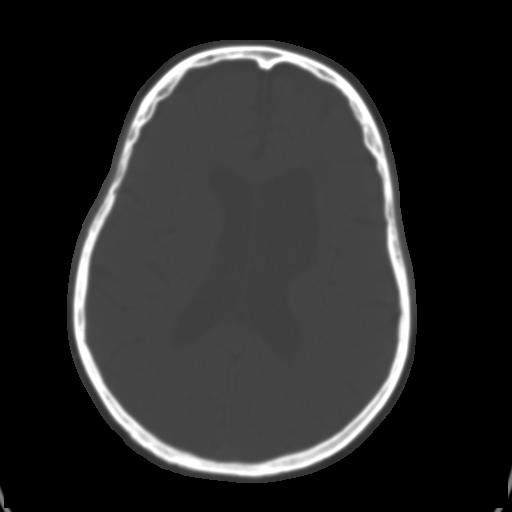
[im 21/32  brain]
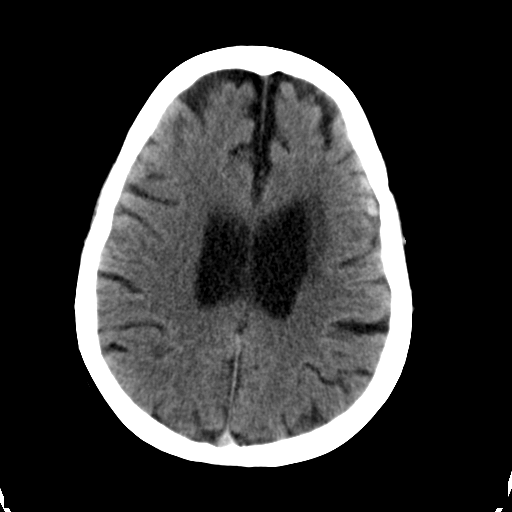
[im 25/32  brain]
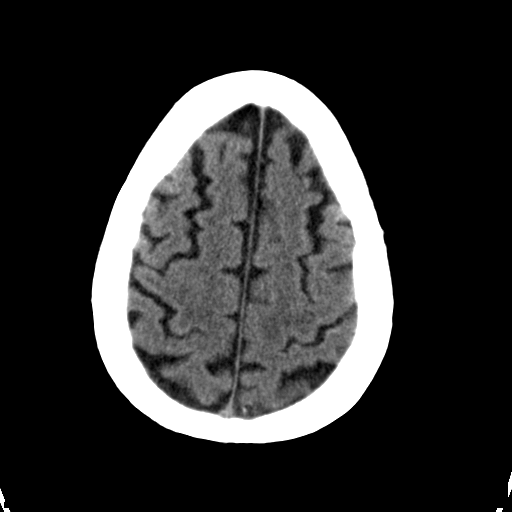
[im 28/32  brain]
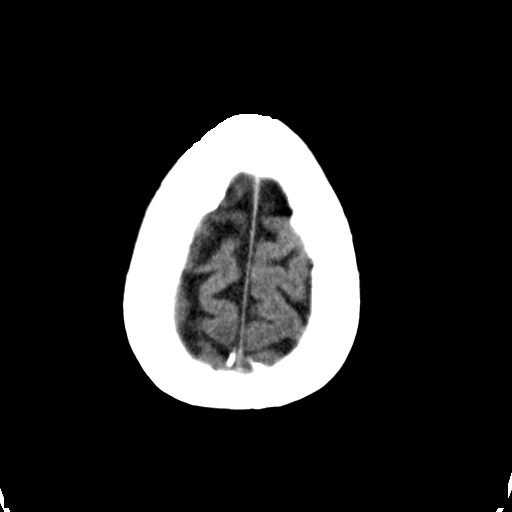

[Series 4: soft tissue recon · axial · 0.42mm/px · z∈[-54,+60]mm · 8 of 32 slices shown]
[im 4/32  brain]
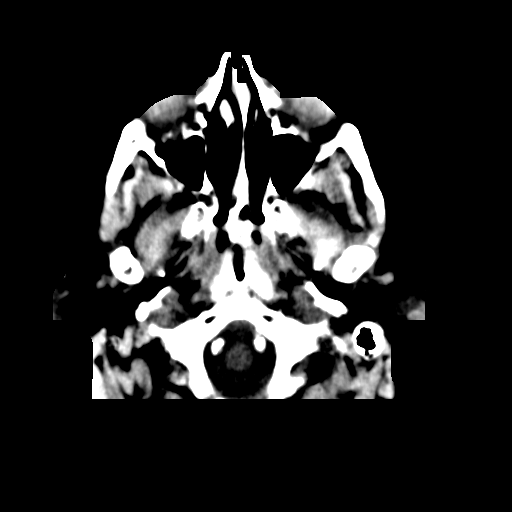
[im 7/32  brain]
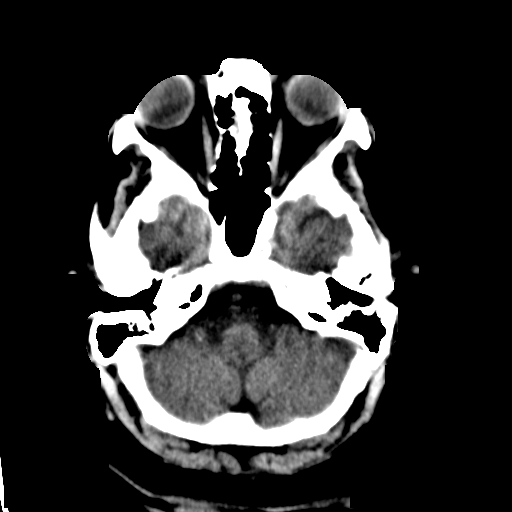
[im 11/32  brain]
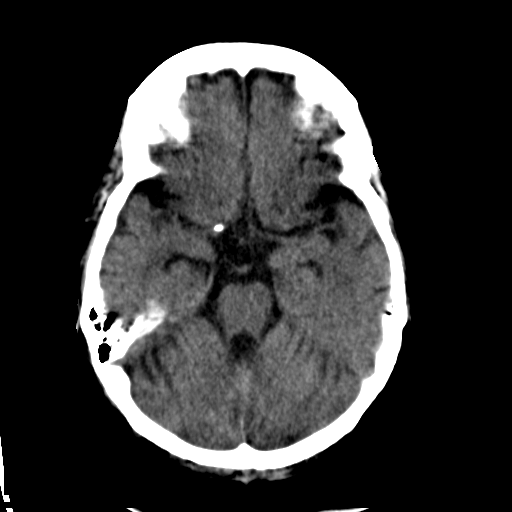
[im 14/32  brain]
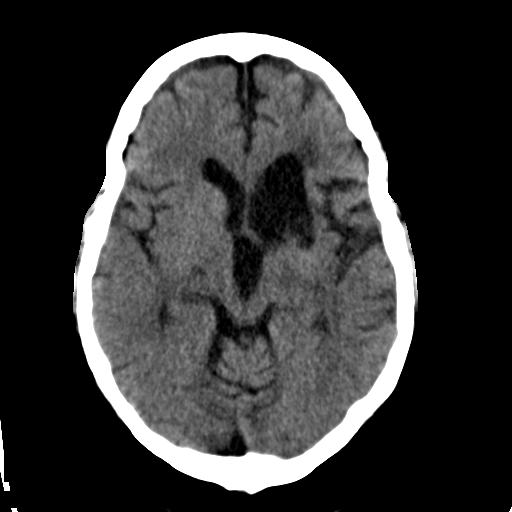
[im 18/32  brain]
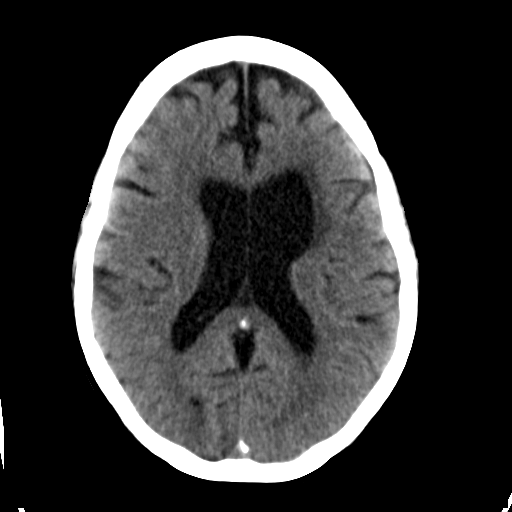
[im 21/32  brain]
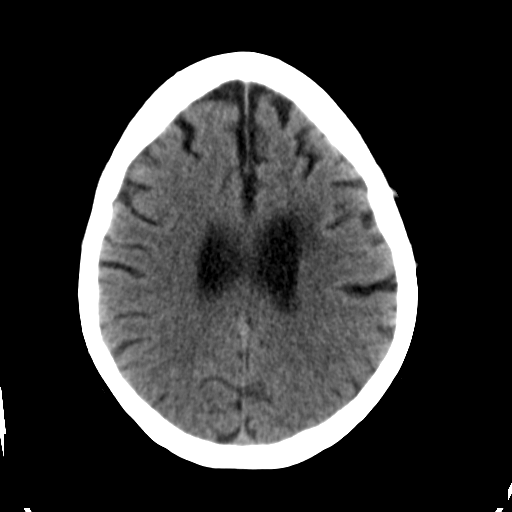
[im 25/32  brain]
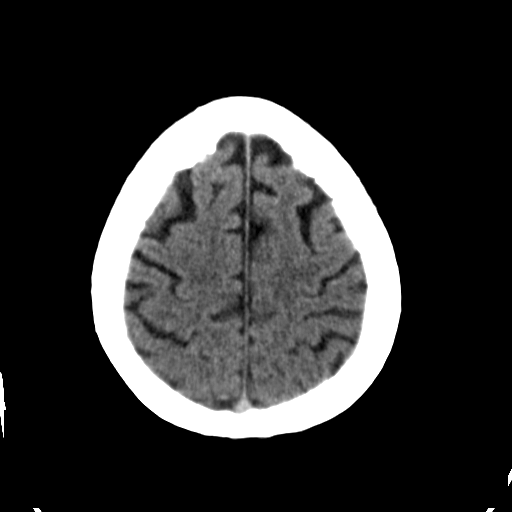
[im 28/32  brain]
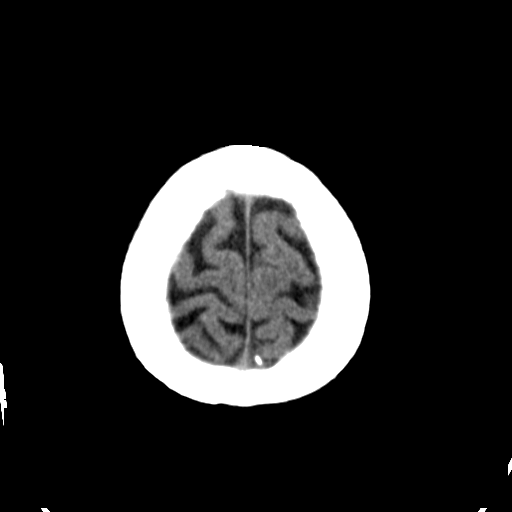

[16 of 30 positions shown; findings below may reference images not displayed]

FINDINGS: Mild diffuse cerebral atrophy. Old encephalomalacia in the left
anterior frontal deep white matter and basal ganglia with associated
ex vacuo dilatation of the anterior horn left lateral ventricle.
This is stable since previous studies and likely represents old
infarct. Low-attenuation changes in the deep white matter consistent
with small vessel ischemia. No mass effect or midline shift. No
abnormal extra-axial fluid collections. Gray-white matter junctions
are distinct. Basal cisterns are not effaced. No evidence of acute
intracranial hemorrhage. No depressed skull fractures. Visualized
paranasal sinuses and mastoid air cells are not opacified. Prominent
vascular calcifications.
IMPRESSION: No acute intracranial abnormalities. No significant change since
previous study.

## 2015-11-18 MED ORDER — ONETOUCH ULTRA SYSTEM W/DEVICE KIT: 1.0000 | PACK | Freq: Two times a day (BID) | Status: DC

## 2015-11-18 NOTE — Telephone Encounter (Signed)
Pt stated that she needs an RX for a new glucose meter and strips sent to [employer] [facility]. Pt stated her meter no longer works. Please advise. Thanks TNP

## 2015-11-23 ENCOUNTER — Ambulatory Visit
Admission: RE | Admit: 2015-11-23 | Discharge: 2015-11-23 | Disposition: A | Discharge status: Home / Self Care | Payer: Commercial Managed Care - HMO | Source: Ambulatory Visit | Attending: Family Medicine | Admitting: Family Medicine

## 2015-11-23 ENCOUNTER — Other Ambulatory Visit: Payer: Self-pay

## 2015-11-23 ENCOUNTER — Telehealth: Payer: Self-pay

## 2015-11-23 ENCOUNTER — Ambulatory Visit (INDEPENDENT_AMBULATORY_CARE_PROVIDER_SITE_OTHER): Payer: Commercial Managed Care - HMO | Admitting: Family Medicine

## 2015-11-23 VITALS — BP 178/62 | HR 88 | Temp 97.4°F | Resp 18

## 2015-11-23 DIAGNOSIS — R519 Headache, unspecified: Secondary | ICD-10-CM

## 2015-11-23 DIAGNOSIS — R42 Dizziness and giddiness: Secondary | ICD-10-CM | POA: Insufficient documentation

## 2015-11-23 DIAGNOSIS — R51 Headache: Secondary | ICD-10-CM | POA: Insufficient documentation

## 2015-11-23 DIAGNOSIS — I10 Essential (primary) hypertension: Secondary | ICD-10-CM | POA: Diagnosis not present

## 2015-11-23 DIAGNOSIS — I6389 Other cerebral infarction: Secondary | ICD-10-CM

## 2015-11-23 DIAGNOSIS — I638 Other cerebral infarction: Secondary | ICD-10-CM

## 2015-11-23 DIAGNOSIS — I69359 Hemiplegia and hemiparesis following cerebral infarction affecting unspecified side: Secondary | ICD-10-CM | POA: Diagnosis not present

## 2015-11-23 MED ORDER — ACCU-CHEK AVIVA PLUS W/DEVICE KIT: PACK | Status: DC

## 2015-11-23 MED ORDER — GLUCOSE BLOOD VI STRP
ORAL_STRIP | Status: DC
Start: 2015-11-23 — End: 2016-04-24

## 2015-11-23 MED ORDER — ACCU-CHEK SOFTCLIX LANCET DEV MISC: Status: DC

## 2015-11-23 NOTE — Telephone Encounter (Signed)
CT of head OK

## 2015-11-23 NOTE — Patient Instructions (Signed)
Take Amlodipine 5 mg 2 tablets daily until we see you back for follow up.
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
## 2015-11-23 NOTE — Telephone Encounter (Signed)
Received fax from [place], one touch needs PA asking to switch to Accu check instead, RX sent in-aa

## 2015-11-23 NOTE — Telephone Encounter (Signed)
Patient advised as directed below.  Thanks,  -Marylu Dudenhoeffer 

## 2015-11-23 NOTE — Telephone Encounter (Signed)
[id number] 

## 2015-11-23 NOTE — Telephone Encounter (Signed)
Call Report: Radiology called with CT results for Susan House.   Results:No acute findings or change since 2015. Atrophy and remote left lenticulostriate infarct.  Thanks,  -Hanadi Stanly

## 2015-11-23 NOTE — Telephone Encounter (Signed)
Please review-aa 

## 2015-11-25 ENCOUNTER — Ambulatory Visit: Payer: Self-pay | Admitting: Family Medicine

## 2015-11-26 ENCOUNTER — Ambulatory Visit (INDEPENDENT_AMBULATORY_CARE_PROVIDER_SITE_OTHER): Payer: Commercial Managed Care - HMO | Admitting: Family Medicine

## 2015-11-26 ENCOUNTER — Encounter: Payer: Self-pay | Admitting: Family Medicine

## 2015-11-26 VITALS — BP 166/60 | HR 76 | Temp 97.5°F | Resp 16

## 2015-11-26 DIAGNOSIS — R6883 Chills (without fever): Secondary | ICD-10-CM

## 2015-11-26 DIAGNOSIS — E118 Type 2 diabetes mellitus with unspecified complications: Secondary | ICD-10-CM | POA: Diagnosis not present

## 2015-11-26 DIAGNOSIS — E038 Other specified hypothyroidism: Secondary | ICD-10-CM | POA: Diagnosis not present

## 2015-11-26 DIAGNOSIS — R42 Dizziness and giddiness: Secondary | ICD-10-CM | POA: Diagnosis not present

## 2015-11-26 DIAGNOSIS — R51 Headache: Secondary | ICD-10-CM | POA: Diagnosis not present

## 2015-11-26 DIAGNOSIS — Z794 Long term (current) use of insulin: Secondary | ICD-10-CM | POA: Diagnosis not present

## 2015-11-26 DIAGNOSIS — N390 Urinary tract infection, site not specified: Secondary | ICD-10-CM

## 2015-11-26 DIAGNOSIS — I10 Essential (primary) hypertension: Secondary | ICD-10-CM

## 2015-11-26 DIAGNOSIS — R519 Headache, unspecified: Secondary | ICD-10-CM

## 2015-11-26 LAB — GLUCOSE, POCT (MANUAL RESULT ENTRY): POC GLUCOSE: 82 mg/dL (ref 70–99)

## 2015-11-26 MED ORDER — MECLIZINE HCL 25 MG PO TABS: 25.0000 mg | ORAL_TABLET | Freq: Two times a day (BID) | ORAL | Status: DC
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
## 2015-11-28 LAB — CBC WITH DIFFERENTIAL/PLATELET
BASOS: 0 %
Basophils Absolute: 0 10*3/uL (ref 0.0–0.2)
EOS (ABSOLUTE): 0.1 10*3/uL (ref 0.0–0.4)
Eos: 1 %
HEMOGLOBIN: 12.3 g/dL (ref 11.1–15.9)
Hematocrit: 38.5 % (ref 34.0–46.6)
IMMATURE GRANS (ABS): 0.1 10*3/uL (ref 0.0–0.1)
IMMATURE GRANULOCYTES: 1 %
LYMPHS: 14 %
Lymphocytes Absolute: 1.4 10*3/uL (ref 0.7–3.1)
MCH: 27.3 pg (ref 26.6–33.0)
MCHC: 31.9 g/dL (ref 31.5–35.7)
MCV: 85 fL (ref 79–97)
MONOCYTES: 9 %
Monocytes Absolute: 0.9 10*3/uL (ref 0.1–0.9)
NEUTROS PCT: 75 %
Neutrophils Absolute: 7.6 10*3/uL — ABNORMAL HIGH (ref 1.4–7.0)
PLATELETS: 330 10*3/uL (ref 150–379)
RBC: 4.51 x10E6/uL (ref 3.77–5.28)
RDW: 16 % — ABNORMAL HIGH (ref 12.3–15.4)
WBC: 10.1 10*3/uL (ref 3.4–10.8)

## 2015-11-28 LAB — COMPREHENSIVE METABOLIC PANEL
ALBUMIN: 4.5 g/dL (ref 3.5–5.5)
ALT: 13 IU/L (ref 0–32)
AST: 17 IU/L (ref 0–40)
Albumin/Globulin Ratio: 1.7 (ref 1.2–2.2)
Alkaline Phosphatase: 91 IU/L (ref 39–117)
BUN / CREAT RATIO: 21 (ref 9–23)
BUN: 32 mg/dL — AB (ref 6–24)
Bilirubin Total: 0.2 mg/dL (ref 0.0–1.2)
CALCIUM: 10 mg/dL (ref 8.7–10.2)
CO2: 22 mmol/L (ref 18–29)
Chloride: 99 mmol/L (ref 96–106)
Creatinine, Ser: 1.5 mg/dL — ABNORMAL HIGH (ref 0.57–1.00)
GFR calc Af Amer: 44 mL/min/{1.73_m2} — ABNORMAL LOW (ref >59)
GFR, EST NON AFRICAN AMERICAN: 38 mL/min/{1.73_m2} — AB (ref >59)
GLUCOSE: 154 mg/dL — AB (ref 65–99)
Globulin, Total: 2.6 g/dL (ref 1.5–4.5)
Potassium: 3.5 mmol/L (ref 3.5–5.2)
Sodium: 143 mmol/L (ref 134–144)
TOTAL PROTEIN: 7.1 g/dL (ref 6.0–8.5)

## 2015-11-28 LAB — SEDIMENTATION RATE: SED RATE: 13 mm/h (ref 0–40)

## 2015-11-28 LAB — URINE CULTURE

## 2015-11-28 LAB — TSH: TSH: 2.47 u[IU]/mL (ref 0.450–4.500)

## 2015-12-06 ENCOUNTER — Other Ambulatory Visit: Payer: Self-pay | Admitting: Family Medicine

## 2015-12-06 DIAGNOSIS — I10 Essential (primary) hypertension: Secondary | ICD-10-CM

## 2015-12-06 MED ORDER — AMLODIPINE BESYLATE 5 MG PO TABS: 5.0000 mg | ORAL_TABLET | Freq: Every day | ORAL | Status: DC

## 2015-12-06 NOTE — Telephone Encounter (Signed)
Pt contacted office for refill request on the following medications:  amLODipine (NORVASC) 5 MG tablet.  [facility] mail order.  CB#[phone]/MW  Pt states Dr Rosanna Randy was going to change the dosage to 10mg  a day when a refill was needed/MW

## 2015-12-09 ENCOUNTER — Other Ambulatory Visit: Payer: Self-pay

## 2015-12-09 DIAGNOSIS — I10 Essential (primary) hypertension: Secondary | ICD-10-CM

## 2015-12-09 MED ORDER — AMLODIPINE BESYLATE 10 MG PO TABS: 10.0000 mg | ORAL_TABLET | Freq: Every day | ORAL | Status: DC

## 2015-12-14 ENCOUNTER — Other Ambulatory Visit: Payer: Self-pay | Admitting: Emergency Medicine

## 2015-12-14 DIAGNOSIS — I10 Essential (primary) hypertension: Secondary | ICD-10-CM

## 2015-12-14 MED ORDER — AMLODIPINE BESYLATE 10 MG PO TABS: 10.0000 mg | ORAL_TABLET | Freq: Every day | ORAL | Status: DC

## 2015-12-14 MED ORDER — AMLODIPINE BESYLATE 10 MG PO TABS
10.0000 mg | ORAL_TABLET | Freq: Every day | ORAL | Status: DC
Start: 2015-12-14 — End: 2016-06-12

## 2015-12-16 ENCOUNTER — Encounter: Payer: Self-pay | Admitting: Family Medicine

## 2015-12-16 ENCOUNTER — Ambulatory Visit (INDEPENDENT_AMBULATORY_CARE_PROVIDER_SITE_OTHER): Payer: Commercial Managed Care - HMO | Admitting: Family Medicine

## 2015-12-16 ENCOUNTER — Other Ambulatory Visit: Payer: Self-pay | Admitting: Family Medicine

## 2015-12-16 VITALS — BP 132/60 | HR 94 | Temp 98.3°F | Resp 16

## 2015-12-16 DIAGNOSIS — M545 Low back pain, unspecified: Secondary | ICD-10-CM

## 2015-12-16 LAB — POCT URINALYSIS DIPSTICK
BILIRUBIN UA: NEGATIVE
Glucose, UA: NEGATIVE
KETONES UA: NEGATIVE
NITRITE UA: POSITIVE
PROTEIN UA: 100
Spec Grav, UA: >=1.03
Urobilinogen, UA: 0.2
pH, UA: 8

## 2015-12-16 MED ORDER — CYCLOBENZAPRINE HCL 5 MG PO TABS: 5.0000 mg | ORAL_TABLET | Freq: Three times a day (TID) | ORAL | Status: DC | PRN

## 2015-12-16 NOTE — Patient Instructions (Signed)
We will call you with the urine culture results. 
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
## 2015-12-18 ENCOUNTER — Other Ambulatory Visit: Payer: Self-pay | Admitting: Family Medicine

## 2015-12-18 DIAGNOSIS — N3090 Cystitis, unspecified without hematuria: Secondary | ICD-10-CM

## 2015-12-18 LAB — URINE CULTURE

## 2015-12-18 MED ORDER — CEPHALEXIN 500 MG PO CAPS: 500.0000 mg | ORAL_CAPSULE | Freq: Two times a day (BID) | ORAL | Status: DC

## 2015-12-20 ENCOUNTER — Telehealth: Payer: Self-pay

## 2015-12-20 NOTE — Telephone Encounter (Signed)
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 Is the Keflex antibiotic helping?

## 2015-12-20 NOTE — Telephone Encounter (Signed)
Was unable to reach patient at this time voice mailbox is full will try contacting patient at a later time. KW

## 2015-12-21 NOTE — Telephone Encounter (Signed)
[id number] 

## 2015-12-22 ENCOUNTER — Emergency Department
Admission: EM | Admit: 2015-12-22 | Discharge: 2015-12-23 | Disposition: A | Discharge status: Home / Self Care | Payer: Commercial Managed Care - HMO | Source: Home / Self Care | Attending: Emergency Medicine | Admitting: Emergency Medicine

## 2015-12-22 ENCOUNTER — Encounter: Payer: Self-pay | Admitting: Emergency Medicine

## 2015-12-22 DIAGNOSIS — Z7984 Long term (current) use of oral hypoglycemic drugs: Secondary | ICD-10-CM | POA: Insufficient documentation

## 2015-12-22 DIAGNOSIS — N390 Urinary tract infection, site not specified: Secondary | ICD-10-CM | POA: Diagnosis not present

## 2015-12-22 DIAGNOSIS — F320 Major depressive disorder, single episode, mild: Secondary | ICD-10-CM | POA: Insufficient documentation

## 2015-12-22 DIAGNOSIS — R1032 Left lower quadrant pain: Secondary | ICD-10-CM

## 2015-12-22 DIAGNOSIS — E039 Hypothyroidism, unspecified: Secondary | ICD-10-CM | POA: Insufficient documentation

## 2015-12-22 DIAGNOSIS — E1129 Type 2 diabetes mellitus with other diabetic kidney complication: Secondary | ICD-10-CM | POA: Insufficient documentation

## 2015-12-22 DIAGNOSIS — I10 Essential (primary) hypertension: Secondary | ICD-10-CM | POA: Insufficient documentation

## 2015-12-22 DIAGNOSIS — Z8673 Personal history of transient ischemic attack (TIA), and cerebral infarction without residual deficits: Secondary | ICD-10-CM | POA: Insufficient documentation

## 2015-12-22 DIAGNOSIS — M1990 Unspecified osteoarthritis, unspecified site: Secondary | ICD-10-CM

## 2015-12-22 DIAGNOSIS — E11319 Type 2 diabetes mellitus with unspecified diabetic retinopathy without macular edema: Secondary | ICD-10-CM

## 2015-12-22 DIAGNOSIS — N200 Calculus of kidney: Secondary | ICD-10-CM

## 2015-12-22 DIAGNOSIS — Z79899 Other long term (current) drug therapy: Secondary | ICD-10-CM

## 2015-12-22 DIAGNOSIS — E785 Hyperlipidemia, unspecified: Secondary | ICD-10-CM | POA: Insufficient documentation

## 2015-12-22 DIAGNOSIS — Z794 Long term (current) use of insulin: Secondary | ICD-10-CM | POA: Insufficient documentation

## 2015-12-22 DIAGNOSIS — Z7982 Long term (current) use of aspirin: Secondary | ICD-10-CM | POA: Insufficient documentation

## 2015-12-22 DIAGNOSIS — I739 Peripheral vascular disease, unspecified: Secondary | ICD-10-CM

## 2015-12-22 LAB — CBC
HCT: 36 % (ref 35.0–47.0)
Hemoglobin: 12 g/dL (ref 12.0–16.0)
MCH: 27.7 pg (ref 26.0–34.0)
MCHC: 33.5 g/dL (ref 32.0–36.0)
MCV: 82.6 fL (ref 80.0–100.0)
PLATELETS: 270 10*3/uL (ref 150–440)
RBC: 4.36 MIL/uL (ref 3.80–5.20)
RDW: 16.2 % — ABNORMAL HIGH (ref 11.5–14.5)
WBC: 10.8 10*3/uL (ref 3.6–11.0)

## 2015-12-22 LAB — COMPREHENSIVE METABOLIC PANEL
ALT: 16 U/L (ref 14–54)
ANION GAP: 13 (ref 5–15)
AST: 28 U/L (ref 15–41)
Albumin: 4 g/dL (ref 3.5–5.0)
Alkaline Phosphatase: 79 U/L (ref 38–126)
BUN: 23 mg/dL — ABNORMAL HIGH (ref 6–20)
CHLORIDE: 106 mmol/L (ref 101–111)
CO2: 18 mmol/L — ABNORMAL LOW (ref 22–32)
CREATININE: 1.31 mg/dL — AB (ref 0.44–1.00)
Calcium: 9.3 mg/dL (ref 8.9–10.3)
GFR, EST AFRICAN AMERICAN: 51 mL/min — AB (ref >60)
GFR, EST NON AFRICAN AMERICAN: 44 mL/min — AB (ref >60)
Glucose, Bld: 232 mg/dL — ABNORMAL HIGH (ref 65–99)
POTASSIUM: 3.9 mmol/L (ref 3.5–5.1)
Sodium: 137 mmol/L (ref 135–145)
Total Bilirubin: 0.6 mg/dL (ref 0.3–1.2)
Total Protein: 7.2 g/dL (ref 6.5–8.1)

## 2015-12-22 LAB — LIPASE, BLOOD: LIPASE: 43 U/L (ref 11–51)

## 2015-12-22 MED ORDER — ONDANSETRON HCL 4 MG/2ML IJ SOLN
4.0000 mg | Freq: Once | INTRAMUSCULAR | Status: AC | PRN
  Administered 2015-12-22: 4 mg via INTRAVENOUS
  Filled 2015-12-22: qty 2

## 2015-12-22 MED ORDER — HYDROMORPHONE HCL 1 MG/ML IJ SOLN
1.0000 mg | Freq: Once | INTRAMUSCULAR | Status: AC
  Administered 2015-12-22: 1 mg via INTRAVENOUS
  Filled 2015-12-22: qty 1

## 2015-12-22 MED ORDER — BARIUM SULFATE 2.1 % PO SUSP
900.0000 mL | Freq: Once | ORAL | Status: AC
  Administered 2015-12-22: 900 mL via ORAL
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
## 2015-12-23 ENCOUNTER — Emergency Department: Payer: Commercial Managed Care - HMO

## 2015-12-23 NOTE — ED Provider Notes (Signed)
I assumed care the patient 11:30 PM from Dr. Reita Cliche with CT scan of the abdomen and pelvis pending which revealed: CT Abdomen Pelvis Wo Contrast (Final result) Result time: 12/23/15 01:57:51   Final result by Rad Results In Interface (12/23/15 01:57:51)   Narrative:   CLINICAL DATA: Left lower quadrant pain  EXAM: CT ABDOMEN AND PELVIS WITHOUT CONTRAST  TECHNIQUE: Multidetector CT imaging of the abdomen and pelvis was performed following the standard protocol without IV contrast.  COMPARISON: 06/10/2014  FINDINGS: Lower chest and abdominal wall: Small fatty umbilical hernia. Lower abdominal wall scarring  Bulky mitral annular calcification/ossification. Coronary atherosclerotic calcification.  Hepatobiliary: No focal liver abnormality.Cholecystectomy with normal common bile duct diameter.  Pancreas: Unremarkable.  Spleen: Unremarkable.  Adrenals/Urinary Tract: Negative adrenals. Punctate stone in the lower pole left kidney, nonobstructive. No hydronephrosis or ureteral calculus. Hazy 7 mm high-density area within the medullary region of the interpolar right kidney, favor hemorrhagic cyst. Mildly thick appearance of the bladder but underdistended. No surrounding inflammatory changes.  Reproductive:Hysterectomy and probable oophorectomies. No adnexal mass.  Stomach/Bowel: No obstruction. No inflammation. Appendectomy.  Vascular/Lymphatic: Extensive aortic and branch vessel atherosclerosis, especially for age. No mass or adenopathy.  Peritoneal: No ascites or pneumoperitoneum.  Musculoskeletal: No acute finding. L4-5 discectomy with solid bony fusion.  IMPRESSION: 1. No acute finding to explain left lower quadrant pain. 2. Small, nonobstructing left nephrolithiasis.   Electronically Signed By: Monte Fantasia M.D. On: 12/23/2015 01:57      His clinical findings were conveyed to the patient. On my evaluation the patient states that her pain was  located in the left flank which is consistent with CT scan finding.  Gregor Hams, MD 12/23/15 [phone]

## 2015-12-23 NOTE — Discharge Instructions (Signed)
Kidney Stones °Kidney stones (urolithiasis) are deposits that form inside your kidneys. The intense pain is caused by the stone moving through the urinary tract. When the stone moves, the ureter goes into spasm around the stone. The stone is usually passed in the urine.  °CAUSES  °· A disorder that makes certain neck glands produce too much parathyroid hormone (primary hyperparathyroidism). °· A buildup of uric acid crystals, similar to gout in your joints. °· Narrowing (stricture) of the ureter. °· A kidney obstruction present at birth (congenital obstruction). °· Previous surgery on the kidney or ureters. °· Numerous kidney infections. °SYMPTOMS  °· Feeling sick to your stomach (nauseous). °· Throwing up (vomiting). °· Blood in the urine (hematuria). °· Pain that usually spreads (radiates) to the groin. °· Frequency or urgency of urination. °DIAGNOSIS  °· Taking a history and physical exam. °· Blood or urine tests. °· CT scan. °· Occasionally, an examination of the inside of the urinary bladder (cystoscopy) is performed. °TREATMENT  °· Observation. °· Increasing your fluid intake. °· Extracorporeal shock wave lithotripsy--This is a noninvasive procedure that uses shock waves to break up kidney stones. °· Surgery may be needed if you have severe pain or persistent obstruction. There are various surgical procedures. Most of the procedures are performed with the use of small instruments. Only small incisions are needed to accommodate these instruments, so recovery time is minimized. °The size, location, and chemical composition are all important variables that will determine the proper choice of action for you. Talk to your health care provider to better understand your situation so that you will minimize the risk of injury to yourself and your kidney.  °HOME CARE INSTRUCTIONS  °· Drink enough water and fluids to keep your urine clear or pale yellow. This will help you to pass the stone or stone fragments. °· Strain  all urine through the provided strainer. Keep all particulate matter and stones for your health care provider to see. The stone causing the pain may be as small as a grain of salt. It is very important to use the strainer each and every time you pass your urine. The collection of your stone will allow your health care provider to analyze it and verify that a stone has actually passed. The stone analysis will often identify what you can do to reduce the incidence of recurrences. °· Only take over-the-counter or prescription medicines for pain, discomfort, or fever as directed by your health care provider. °· Keep all follow-up visits as told by your health care provider. This is important. °· Get follow-up X-rays if required. The absence of pain does not always mean that the stone has passed. It may have only stopped moving. If the urine remains completely obstructed, it can cause loss of kidney function or even complete destruction of the kidney. It is your responsibility to make sure X-rays and follow-ups are completed. Ultrasounds of the kidney can show blockages and the status of the kidney. Ultrasounds are not associated with any radiation and can be performed easily in a matter of minutes. °· Make changes to your daily diet as told by your health care provider. You may be told to: °¨ Limit the amount of salt that you eat. °¨ Eat 5 or more servings of fruits and vegetables each day. °¨ Limit the amount of meat, poultry, fish, and eggs that you eat. °· Collect a 24-hour urine sample as told by your health care provider. You may need to collect another urine sample every 6-12   months. °SEEK MEDICAL CARE IF: °· You experience pain that is progressive and unresponsive to any pain medicine you have been prescribed. °SEEK IMMEDIATE MEDICAL CARE IF:  °· Pain cannot be controlled with the prescribed medicine. °· You have a fever or shaking chills. °· The severity or intensity of pain increases over 18 hours and is not  relieved by pain medicine. °· You develop a new onset of abdominal pain. °· You feel faint or pass out. °· You are unable to urinate. °  °This information is not intended to replace advice given to you by your health care provider. Make sure you discuss any questions you have with your health care provider. °  °Document Released: 07/03/2005 Document Revised: 03/24/2015 Document Reviewed: 12/04/2012 °Elsevier Interactive Patient Education ©2016 [employer] ° °

## 2015-12-24 ENCOUNTER — Ambulatory Visit (INDEPENDENT_AMBULATORY_CARE_PROVIDER_SITE_OTHER): Payer: Commercial Managed Care - HMO | Admitting: Urology

## 2015-12-24 VITALS — BP 134/74 | HR 99 | Ht 61.0 in | Wt 272.0 lb

## 2015-12-24 DIAGNOSIS — N3941 Urge incontinence: Secondary | ICD-10-CM | POA: Diagnosis not present

## 2015-12-24 DIAGNOSIS — N200 Calculus of kidney: Secondary | ICD-10-CM | POA: Diagnosis not present

## 2015-12-24 DIAGNOSIS — N390 Urinary tract infection, site not specified: Secondary | ICD-10-CM | POA: Diagnosis not present

## 2015-12-24 DIAGNOSIS — N952 Postmenopausal atrophic vaginitis: Secondary | ICD-10-CM

## 2015-12-26 ENCOUNTER — Emergency Department: Payer: Commercial Managed Care - HMO

## 2015-12-26 ENCOUNTER — Inpatient Hospital Stay
Admission: EM | Admit: 2015-12-26 | Discharge: 2015-12-28 | DRG: 689 | Disposition: A | Discharge status: Home Health | Payer: Commercial Managed Care - HMO | Attending: Internal Medicine | Admitting: Internal Medicine

## 2015-12-26 DIAGNOSIS — Z79899 Other long term (current) drug therapy: Secondary | ICD-10-CM | POA: Diagnosis not present

## 2015-12-26 DIAGNOSIS — Z885 Allergy status to narcotic agent status: Secondary | ICD-10-CM | POA: Diagnosis not present

## 2015-12-26 DIAGNOSIS — E119 Type 2 diabetes mellitus without complications: Secondary | ICD-10-CM | POA: Diagnosis present

## 2015-12-26 DIAGNOSIS — N390 Urinary tract infection, site not specified: Principal | ICD-10-CM | POA: Diagnosis present

## 2015-12-26 DIAGNOSIS — Z7902 Long term (current) use of antithrombotics/antiplatelets: Secondary | ICD-10-CM | POA: Diagnosis not present

## 2015-12-26 DIAGNOSIS — G9341 Metabolic encephalopathy: Secondary | ICD-10-CM | POA: Diagnosis present

## 2015-12-26 DIAGNOSIS — Z7409 Other reduced mobility: Secondary | ICD-10-CM

## 2015-12-26 DIAGNOSIS — I69354 Hemiplegia and hemiparesis following cerebral infarction affecting left non-dominant side: Secondary | ICD-10-CM | POA: Diagnosis not present

## 2015-12-26 DIAGNOSIS — E1129 Type 2 diabetes mellitus with other diabetic kidney complication: Secondary | ICD-10-CM | POA: Diagnosis present

## 2015-12-26 DIAGNOSIS — R6510 Systemic inflammatory response syndrome (SIRS) of non-infectious origin without acute organ dysfunction: Secondary | ICD-10-CM

## 2015-12-26 DIAGNOSIS — Z981 Arthrodesis status: Secondary | ICD-10-CM

## 2015-12-26 DIAGNOSIS — Z833 Family history of diabetes mellitus: Secondary | ICD-10-CM

## 2015-12-26 DIAGNOSIS — Z888 Allergy status to other drugs, medicaments and biological substances status: Secondary | ICD-10-CM

## 2015-12-26 DIAGNOSIS — R410 Disorientation, unspecified: Secondary | ICD-10-CM

## 2015-12-26 DIAGNOSIS — Z8261 Family history of arthritis: Secondary | ICD-10-CM

## 2015-12-26 DIAGNOSIS — E785 Hyperlipidemia, unspecified: Secondary | ICD-10-CM | POA: Diagnosis present

## 2015-12-26 DIAGNOSIS — Z8249 Family history of ischemic heart disease and other diseases of the circulatory system: Secondary | ICD-10-CM

## 2015-12-26 DIAGNOSIS — Z823 Family history of stroke: Secondary | ICD-10-CM

## 2015-12-26 DIAGNOSIS — Z881 Allergy status to other antibiotic agents status: Secondary | ICD-10-CM | POA: Diagnosis not present

## 2015-12-26 DIAGNOSIS — I10 Essential (primary) hypertension: Secondary | ICD-10-CM | POA: Diagnosis present

## 2015-12-26 DIAGNOSIS — Z7982 Long term (current) use of aspirin: Secondary | ICD-10-CM | POA: Diagnosis not present

## 2015-12-26 DIAGNOSIS — Z6841 Body Mass Index (BMI) 40.0 and over, adult: Secondary | ICD-10-CM | POA: Diagnosis not present

## 2015-12-26 DIAGNOSIS — G9340 Encephalopathy, unspecified: Secondary | ICD-10-CM

## 2015-12-26 DIAGNOSIS — R680 Hypothermia, not associated with low environmental temperature: Secondary | ICD-10-CM | POA: Diagnosis present

## 2015-12-26 DIAGNOSIS — R4182 Altered mental status, unspecified: Secondary | ICD-10-CM

## 2015-12-26 LAB — CBC
HCT: 37.2 % (ref 35.0–47.0)
Hemoglobin: 12.2 g/dL (ref 12.0–16.0)
MCH: 27.6 pg (ref 26.0–34.0)
MCHC: 32.9 g/dL (ref 32.0–36.0)
MCV: 83.7 fL (ref 80.0–100.0)
Platelets: 251 K/uL (ref 150–440)
RBC: 4.44 MIL/uL (ref 3.80–5.20)
RDW: 16.2 % — ABNORMAL HIGH (ref 11.5–14.5)
WBC: 8.8 K/uL (ref 3.6–11.0)

## 2015-12-26 LAB — URINALYSIS COMPLETE WITH MICROSCOPIC (ARMC ONLY)
Bilirubin Urine: NEGATIVE
Glucose, UA: NEGATIVE mg/dL
Ketones, ur: NEGATIVE mg/dL
Nitrite: NEGATIVE
PH: 5 (ref 5.0–8.0)
PROTEIN: 30 mg/dL — AB
Specific Gravity, Urine: 1.014 (ref 1.005–1.030)

## 2015-12-26 LAB — COMPREHENSIVE METABOLIC PANEL
ALBUMIN: 4.1 g/dL (ref 3.5–5.0)
ALK PHOS: 87 U/L (ref 38–126)
ALT: 18 U/L (ref 14–54)
ANION GAP: 12 (ref 5–15)
AST: 21 U/L (ref 15–41)
BILIRUBIN TOTAL: 0.5 mg/dL (ref 0.3–1.2)
BUN: 38 mg/dL — ABNORMAL HIGH (ref 6–20)
CALCIUM: 9.5 mg/dL (ref 8.9–10.3)
CO2: 21 mmol/L — ABNORMAL LOW (ref 22–32)
Chloride: 105 mmol/L (ref 101–111)
Creatinine, Ser: 1.3 mg/dL — ABNORMAL HIGH (ref 0.44–1.00)
GFR calc Af Amer: 51 mL/min — ABNORMAL LOW (ref >60)
GFR, EST NON AFRICAN AMERICAN: 44 mL/min — AB (ref >60)
GLUCOSE: 195 mg/dL — AB (ref 65–99)
POTASSIUM: 3.7 mmol/L (ref 3.5–5.1)
Sodium: 138 mmol/L (ref 135–145)
TOTAL PROTEIN: 7.6 g/dL (ref 6.5–8.1)

## 2015-12-26 LAB — PROTIME-INR
INR: 0.99
Prothrombin Time: 13.3 seconds (ref 11.4–15.0)

## 2015-12-26 LAB — DIFFERENTIAL
Basophils Absolute: 0.1 K/uL (ref 0–0.1)
Basophils Relative: 1 %
Eosinophils Absolute: 0.1 K/uL (ref 0–0.7)
Eosinophils Relative: 2 %
Lymphocytes Relative: 10 %
Lymphs Abs: 0.9 K/uL — ABNORMAL LOW (ref 1.0–3.6)
Monocytes Absolute: 0.6 K/uL (ref 0.2–0.9)
Monocytes Relative: 7 %
Neutro Abs: 7.1 K/uL — ABNORMAL HIGH (ref 1.4–6.5)
Neutrophils Relative %: 80 %

## 2015-12-26 LAB — APTT: aPTT: 27 seconds (ref 24–36)

## 2015-12-26 LAB — LACTIC ACID, PLASMA
Lactic Acid, Venous: 1.1 mmol/L (ref 0.5–2.0)
Lactic Acid, Venous: 1.6 mmol/L (ref 0.5–2.0)

## 2015-12-26 LAB — TROPONIN I: Troponin I: <0.03 ng/mL

## 2015-12-26 LAB — GLUCOSE, CAPILLARY: Glucose-Capillary: 265 mg/dL — ABNORMAL HIGH (ref 65–99)

## 2015-12-26 MED ORDER — MAGNESIUM OXIDE 400 (241.3 MG) MG PO TABS
400.0000 mg | ORAL_TABLET | Freq: Every day | ORAL | Status: DC
  Administered 2015-12-26 – 2015-12-28 (×3): 400 mg via ORAL
  Filled 2015-12-26 (×3): qty 1

## 2015-12-26 MED ORDER — AMLODIPINE BESYLATE 10 MG PO TABS
10.0000 mg | ORAL_TABLET | Freq: Every day | ORAL | Status: DC
  Administered 2015-12-27 – 2015-12-28 (×2): 10 mg via ORAL
  Filled 2015-12-26 (×3): qty 1

## 2015-12-26 MED ORDER — ONDANSETRON HCL 4 MG PO TABS: 4.0000 mg | ORAL_TABLET | Freq: Four times a day (QID) | ORAL | Status: DC | PRN

## 2015-12-26 MED ORDER — INSULIN ASPART 100 UNIT/ML ~~LOC~~ SOLN: 0.0000 [IU] | Freq: Three times a day (TID) | SUBCUTANEOUS | Status: DC

## 2015-12-26 MED ORDER — ENALAPRIL MALEATE 5 MG PO TABS
20.0000 mg | ORAL_TABLET | Freq: Two times a day (BID) | ORAL | Status: DC
Start: 2015-12-26 — End: 2015-12-28
  Administered 2015-12-26 – 2015-12-28 (×4): 20 mg via ORAL
  Filled 2015-12-26 (×4): qty 4

## 2015-12-26 MED ORDER — ACETAMINOPHEN 325 MG PO TABS: 650.0000 mg | ORAL_TABLET | Freq: Four times a day (QID) | ORAL | Status: DC | PRN

## 2015-12-26 MED ORDER — OXYBUTYNIN CHLORIDE ER 5 MG PO TB24
5.0000 mg | ORAL_TABLET | Freq: Every day | ORAL | Status: DC
  Administered 2015-12-26 – 2015-12-28 (×3): 5 mg via ORAL
  Filled 2015-12-26 (×3): qty 1

## 2015-12-26 MED ORDER — ONDANSETRON HCL 4 MG/2ML IJ SOLN: 4.0000 mg | Freq: Four times a day (QID) | INTRAMUSCULAR | Status: DC | PRN

## 2015-12-26 MED ORDER — SODIUM CHLORIDE 0.9 % IV SOLN
INTRAVENOUS | Status: DC
  Administered 2015-12-26 – 2015-12-28 (×3): via INTRAVENOUS

## 2015-12-26 MED ORDER — CLOPIDOGREL BISULFATE 75 MG PO TABS
75.0000 mg | ORAL_TABLET | Freq: Every day | ORAL | Status: DC
  Administered 2015-12-26 – 2015-12-28 (×3): 75 mg via ORAL
  Filled 2015-12-26 (×3): qty 1

## 2015-12-26 MED ORDER — BISACODYL 10 MG RE SUPP: 10.0000 mg | Freq: Every day | RECTAL | Status: DC | PRN

## 2015-12-26 MED ORDER — METFORMIN HCL 500 MG PO TABS
1000.0000 mg | ORAL_TABLET | Freq: Two times a day (BID) | ORAL | Status: DC
  Administered 2015-12-27 – 2015-12-28 (×4): 1000 mg via ORAL
  Filled 2015-12-26 (×5): qty 2

## 2015-12-26 MED ORDER — HYDROCHLOROTHIAZIDE 25 MG PO TABS
25.0000 mg | ORAL_TABLET | Freq: Every day | ORAL | Status: DC
Start: 2015-12-26 — End: 2015-12-28
  Administered 2015-12-27 – 2015-12-28 (×2): 25 mg via ORAL
  Filled 2015-12-26 (×3): qty 1

## 2015-12-26 MED ORDER — NITROGLYCERIN 0.4 MG SL SUBL: 0.4000 mg | SUBLINGUAL_TABLET | SUBLINGUAL | Status: DC | PRN

## 2015-12-26 MED ORDER — ACETAMINOPHEN 650 MG RE SUPP: 650.0000 mg | Freq: Four times a day (QID) | RECTAL | Status: DC | PRN

## 2015-12-26 MED ORDER — ATORVASTATIN CALCIUM 20 MG PO TABS
20.0000 mg | ORAL_TABLET | Freq: Every day | ORAL | Status: DC
  Administered 2015-12-26 – 2015-12-27 (×2): 20 mg via ORAL
  Filled 2015-12-26 (×2): qty 1

## 2015-12-26 MED ORDER — DEXTROSE 5 % IV SOLN
1.0000 g | Freq: Once | INTRAVENOUS | Status: AC
  Administered 2015-12-26: 1 g via INTRAVENOUS
  Filled 2015-12-26: qty 10

## 2015-12-26 MED ORDER — CRANBERRY 1000 MG PO CAPS: 2000.0000 mg | ORAL_CAPSULE | Freq: Every day | ORAL | Status: DC

## 2015-12-26 MED ORDER — BACLOFEN 10 MG PO TABS
20.0000 mg | ORAL_TABLET | Freq: Two times a day (BID) | ORAL | Status: DC
  Administered 2015-12-26 – 2015-12-28 (×4): 20 mg via ORAL
  Filled 2015-12-26 (×4): qty 2

## 2015-12-26 MED ORDER — INSULIN ASPART PROT & ASPART (70-30 MIX) 100 UNIT/ML ~~LOC~~ SUSP
20.0000 [IU] | Freq: Two times a day (BID) | SUBCUTANEOUS | Status: DC
  Administered 2015-12-27 – 2015-12-28 (×4): 20 [IU] via SUBCUTANEOUS
  Filled 2015-12-26 (×3): qty 20

## 2015-12-26 MED ORDER — HYDROCODONE-ACETAMINOPHEN 5-325 MG PO TABS
1.0000 | ORAL_TABLET | ORAL | Status: DC | PRN
  Administered 2015-12-26 – 2015-12-28 (×3): 2 via ORAL
  Filled 2015-12-26 (×2): qty 1
  Filled 2015-12-26 (×2): qty 2

## 2015-12-26 MED ORDER — PIPERACILLIN-TAZOBACTAM 3.375 G IVPB
3.3750 g | Freq: Three times a day (TID) | INTRAVENOUS | Status: DC
  Administered 2015-12-26 – 2015-12-28 (×5): 3.375 g via INTRAVENOUS
  Filled 2015-12-26 (×8): qty 50

## 2015-12-26 MED ORDER — GABAPENTIN 100 MG PO CAPS
100.0000 mg | ORAL_CAPSULE | Freq: Two times a day (BID) | ORAL | Status: DC
  Administered 2015-12-26 – 2015-12-28 (×4): 100 mg via ORAL
  Filled 2015-12-26 (×4): qty 1

## 2015-12-26 MED ORDER — HEPARIN SODIUM (PORCINE) 5000 UNIT/ML IJ SOLN
5000.0000 [IU] | Freq: Three times a day (TID) | INTRAMUSCULAR | Status: DC
  Administered 2015-12-26 – 2015-12-28 (×5): 5000 [IU] via SUBCUTANEOUS
  Filled 2015-12-26 (×5): qty 1

## 2015-12-26 MED ORDER — DOCUSATE SODIUM 100 MG PO CAPS
100.0000 mg | ORAL_CAPSULE | Freq: Two times a day (BID) | ORAL | Status: DC
Start: 2015-12-26 — End: 2015-12-28
  Administered 2015-12-26 – 2015-12-27 (×3): 100 mg via ORAL
  Filled 2015-12-26 (×4): qty 1

## 2015-12-26 MED ORDER — ASPIRIN EC 81 MG PO TBEC
81.0000 mg | DELAYED_RELEASE_TABLET | Freq: Every day | ORAL | Status: DC
  Administered 2015-12-26 – 2015-12-28 (×3): 81 mg via ORAL
  Filled 2015-12-26 (×3): qty 1

## 2015-12-26 MED ORDER — PANTOPRAZOLE SODIUM 40 MG PO TBEC
40.0000 mg | DELAYED_RELEASE_TABLET | Freq: Every day | ORAL | Status: DC
  Administered 2015-12-26 – 2015-12-28 (×3): 40 mg via ORAL
  Filled 2015-12-26 (×3): qty 1

## 2015-12-26 NOTE — Progress Notes (Signed)
PHARMACIST - PHYSICIAN ORDER COMMUNICATION  CONCERNING: P&T Medication Policy on Herbal Medications  DESCRIPTION:  This patient's order for:  Cranberry tablets  has been noted.  This product(s) is classified as an "herbal" or natural product. Due to a lack of definitive safety studies or FDA approval, nonstandard manufacturing practices, plus the potential risk of unknown drug-drug interactions while on inpatient medications, the Pharmacy and Therapeutics Committee does not permit the use of "herbal" or natural products of this type within .   ACTION TAKEN: The pharmacy department is unable to verify this order at this time and your patient has been informed of this safety policy. Please reevaluate patient's clinical condition at discharge and address if the herbal or natural product(s) should be resumed at that time.  

## 2015-12-26 NOTE — ED Notes (Signed)
Pt presents via EMS from home with c/o AMS x4 days worsening. Vomit stain noted to gown. EMS reports couch saturated with urine. Unable to accurately answer questions.
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
## 2015-12-26 NOTE — H&P (Signed)
History and Physical    Susan House OJJ:[id number] DOB: October 06, 1955 DOA: 12/26/2015  Referring physician: Dr. Reita Cliche PCP: Wilhemena Durie, MD  Specialists: none  Chief Complaint: confusion and vomiting  HPI: Susan House is a 60 y.o. female has a past medical history significant for HTN, DM, obesity, CVA, and elevated lipids now with 2 day hx of vomiting and confusion. In ER, pt noted to be hypothermic with altered mental status with UTI c/w SIRS. She is now admitted. The patient is confused. The family denies fever or diarrhea. No recent complaints of pain.  Review of Systems: The patient denies anorexia, fever, weight loss,, vision loss, decreased hearing, hoarseness, chest pain, syncope, dyspnea on exertion, peripheral edema, balance deficits, hemoptysis, abdominal pain, melena, hematochezia, severe indigestion/heartburn, hematuria, incontinence, genital sores, muscle weakness, suspicious skin lesions, transient blindness, difficulty walking, depression, unusual weight change, abnormal bleeding, enlarged lymph nodes, angioedema, and breast masses.   Past Medical History  Diagnosis Date  . Cellulitis and abscess of face   . Allergy   . Depression   . Hypertension   . Edema   . Arthritis   . Migraine   . GERD (gastroesophageal reflux disease)   . Stroke ([place]) 2000  . Diabetes mellitus ([place])   . Hyperlipidemia   . Morbid obesity ([place])   . IBS (irritable bowel syndrome)   . Urinary incontinence   . History of nuclear stress test     a. 12/2009: lexiscan - negative  . TIA (transient ischemic attack) 2010  . History of echocardiogram     2008: normal, 2011: normal LVSF  . Hematuria   . Nocturia   . Urgency of micturation   . Urinary frequency    Past Surgical History  Procedure Laterality Date  . Colonoscopy  2010  . Appendectomy    . Hernia repair    . Eye surgery    . Upper gi endoscopy    . Carpel tunnel surgery    . Back surgery      extensive spinal  fusion  . Shoulder surgery    . Wrist fracture surgery Left   . Abdominoplasty    . Toe surgery    . Abdominal hysterectomy    . Cataract extraction      x 2  . Cholecystectomy    . Cesarean section     Social History:  reports that she has never smoked. She has never used smokeless tobacco. She reports that she does not drink alcohol or use illicit drugs.  Allergies  Allergen Reactions  . Morphine And Related Anaphylaxis  . Fentanyl Other (See Comments)    Unknown reaction.  . Reglan [Metoclopramide]   . Simvastatin Other (See Comments)    Muscle pain  . Betadine [Povidone Iodine] Rash  . Iodine Rash  . Tetracyclines & Related Rash    Family History  Problem Relation Age of Onset  . Hyperlipidemia Mother   . Arrhythmia Mother     WPW  . Heart attack Father 10  . Hyperlipidemia Father   . Hypertension Father   . Hypertension Mother   . Rheum arthritis Mother   . Heart disease Mother   . Stroke Father   . Diabetes Father   . Heart disease Father   . Coronary artery disease Father   . Diabetes Sister   . Hyperlipidemia Sister   . Hypertension Sister   . Depression Sister   . Depression Sister   . Diabetes Sister   .  Hypertension Sister   . Kidney disease Paternal Grandmother     Prior to Admission medications   Medication Sig Start Date End Date Taking? Authorizing Provider  amLODipine (NORVASC) 10 MG tablet Take 1 tablet (10 mg total) by mouth daily. 12/14/15  Yes Richard Maceo Pro., MD  aspirin 325 MG tablet Take 325 mg by mouth daily.   Yes Historical Provider, MD  atorvastatin (LIPITOR) 20 MG tablet Take 1 tablet (20 mg total) by mouth at bedtime. 09/13/15  Yes Richard Maceo Pro., MD  baclofen (LIORESAL) 20 MG tablet Take 20 mg by mouth 2 (two) times daily.   Yes Historical Provider, MD  Blood Glucose Monitoring Suppl (ACCU-CHEK AVIVA PLUS) w/Device KIT Check sugar three times daily DX E11.9-needs a meter 11/23/15  Yes Richard Maceo Pro., MD   clopidogrel (PLAVIX) 75 MG tablet TAKE 1 TABLET EVERY DAY 12/20/15  Yes Jerrol Banana., MD  Cranberry 1000 MG CAPS Take 2,000 mg by mouth daily.    Yes Historical Provider, MD  enalapril (VASOTEC) 20 MG tablet Take 20 mg by mouth 2 (two) times daily.   Yes Historical Provider, MD  gabapentin (NEURONTIN) 100 MG capsule Take 1 capsule (100 mg total) by mouth 3 (three) times daily. Patient taking differently: Take 100 mg by mouth 2 (two) times daily.  09/13/15  Yes Richard Maceo Pro., MD  glucose blood (ACCU-CHEK AVIVA) test strip Check sugar three times daily DX E11.9 11/23/15  Yes Jerrol Banana., MD  hydrochlorothiazide (HYDRODIURIL) 25 MG tablet TAKE 1 TABLET EVERY DAY 09/06/15  Yes Jerrol Banana., MD  HYDROcodone-acetaminophen [facility]) 10-325 MG tablet Take 1-2 tablets by mouth every 4 (four) hours as needed. 11/01/15  Yes Richard Maceo Pro., MD  insulin aspart protamine- aspart (NOVOLOG MIX 70/30) (70-30) 100 UNIT/ML injection Inject 25-30 Units into the skin 2 (two) times daily.    Yes Historical Provider, MD  Lancet Devices [facility]) lancets Check sugar three times daily DX E11.9 11/23/15  Yes Richard L Cranford Mon., MD  magnesium oxide (MAG-OX) 400 (241.3 Mg) MG tablet Take 1 tablet (400 mg total) by mouth daily. 07/29/15  Yes Richard Maceo Pro., MD  metFORMIN (GLUCOPHAGE) 1000 MG tablet TAKE 1 TABLET TWICE DAILY 10/18/15  Yes Richard Maceo Pro., MD  nitroGLYCERIN (NITROSTAT) 0.4 MG SL tablet Place 1 tablet (0.4 mg total) under the tongue every 5 (five) minutes as needed for chest pain. 10/11/15  Yes Richard Maceo Pro., MD  oxybutynin (DITROPAN-XL) 5 MG 24 hr tablet Take 1 tablet (5 mg total) by mouth daily. 08/02/15  Yes Richard Maceo Pro., MD  pantoprazole (PROTONIX) 40 MG tablet Take 1 tablet (40 mg total) by mouth daily. 07/29/15  Yes Richard Maceo Pro., MD  cyclobenzaprine (FLEXERIL) 5 MG tablet Take 1 tablet (5 mg total) by mouth 3 (three) times daily  as needed for muscle spasms. 12/16/15   Carmon Ginsberg, PA  meclizine (ANTIVERT) 25 MG tablet Take 1 tablet (25 mg total) by mouth 2 (two) times daily. 11/26/15   Richard Maceo Pro., MD   Physical Exam: Filed Vitals:   12/26/15 1300 12/26/15 1330 12/26/15 1343 12/26/15 1411  BP: 132/50 130/67  138/59  Pulse: 80 77  70  Temp:   96.3 F (35.7 C)   TempSrc:   Rectal   Resp: 10 11  20   Height:      Weight:      SpO2: 98%  97%  97%     General:  No apparent distress, obese, [place]/AT  Eyes: PERRL, EOMI, no scleral icterus, conjunctiva clear  ENT: moist oropharynx without lesions, TM's benign. Dentition fair  Neck: supple, no lymphadenopathy. No bruits or thyromegaly  Cardiovascular: regular rate without MRG; 2+ peripheral pulses, no JVD, 1+ peripheral edema  Respiratory: CTA biL, good air movement without wheezing, rhonchi or crackled, respiratory effort normal  Abdomen: soft, non tender to palpation, positive bowel sounds, no guarding, no rebound  Skin: no rashes or lesions  Musculoskeletal: normal bulk and tone, no joint swelling  Psychiatric: normal mood and affect, oriented to person only  Neurologic: CN 2-12 grossly intact, Motor strength 5/5 in all 4 groups with symmetric DTR's and non-focal sensory exam  Labs on Admission:  Basic Metabolic Panel:  Recent Labs Lab 12/22/15 2249 12/26/15 1202  NA 137 138  K 3.9 3.7  CL 106 105  CO2 18* 21*  GLUCOSE 232* 195*  BUN 23* 38*  CREATININE 1.31* 1.30*  CALCIUM 9.3 9.5   Liver Function Tests:  Recent Labs Lab 12/22/15 2249 12/26/15 1202  AST 28 21  ALT 16 18  ALKPHOS 79 87  BILITOT 0.6 0.5  PROT 7.2 7.6  ALBUMIN 4.0 4.1    Recent Labs Lab 12/22/15 2249  LIPASE 43   No results for input(s): AMMONIA in the last 168 hours. CBC:  Recent Labs Lab 12/22/15 2249 12/26/15 1202  WBC 10.8 8.8  NEUTROABS  --  7.1*  HGB 12.0 12.2  HCT 36.0 37.2  MCV 82.6 83.7  PLT 270 251   Cardiac Enzymes:  Recent  Labs Lab 12/26/15 1202  TROPONINI <0.03    BNP (last 3 results) No results for input(s): BNP in the last 8760 hours.  ProBNP (last 3 results) No results for input(s): PROBNP in the last 8760 hours.  CBG: No results for input(s): GLUCAP in the last 168 hours.  Radiological Exams on Admission: Ct Head Wo Contrast  12/26/2015  CLINICAL DATA:  Increasing unresponsiveness. EXAM: CT HEAD WITHOUT CONTRAST TECHNIQUE: Contiguous axial images were obtained from the base of the skull through the vertex without intravenous contrast. COMPARISON:  Nov 23, 2015 CT scan of the brain FINDINGS: Paranasal sinuses, mastoid air cells, and middle ears are normal. Bones and extracranial soft tissues are unremarkable. No mass, mass effect, or midline shift. No subdural, epidural, or subarachnoid hemorrhage. The cerebellum, brainstem, and basal cisterns are normal. A remote left lenticulostriate infarct with ex vacuo dilatation of the frontal horn of the left lateral ventricle is stable. Scattered white matter changes are stable. No acute cortical ischemia or infarct. No other acute abnormalities. IMPRESSION: 1. No acute intracranial abnormalities identified. Electronically Signed   By: Dorise Bullion III M.D   On: 12/26/2015 12:39   Dg Chest Port 1 View  12/26/2015  CLINICAL DATA:  Acute mental status change. EXAM: PORTABLE CHEST 1 VIEW COMPARISON:  July 22, 2015 FINDINGS: Heart size not well evaluated due to portable technique. However, the heart appears to be at least borderline in size. The hila and mediastinum are normal. No pulmonary nodules, masses, or infiltrates. IMPRESSION: No active disease. Electronically Signed   By: Dorise Bullion III M.D   On: 12/26/2015 15:02    EKG: Independently reviewed.  Assessment/Plan Principal Problem:   SIRS (systemic inflammatory response syndrome) (HCC) Active Problems:   Type 2 diabetes mellitus with kidney complication, without long-term current use of insulin  (HCC)   UTI (urinary tract infection)  Encephalopathy acute   Will admit to floor with IV ABX and IV fluids. Cultures sent. Follow sugars. Consider Neuro consult if mental status does not improve. Repeat labs in AM  Diet: low carb Fluids: NS@100  DVT Prophylaxis: SQ Heparin  Code Status: FULL  Family Communication: yes  Disposition Plan: home  Time spent: 50 min

## 2015-12-27 LAB — COMPREHENSIVE METABOLIC PANEL
ALBUMIN: 3.5 g/dL (ref 3.5–5.0)
ALK PHOS: 75 U/L (ref 38–126)
ALT: 16 U/L (ref 14–54)
AST: 22 U/L (ref 15–41)
Anion gap: 7 (ref 5–15)
BUN: 28 mg/dL — ABNORMAL HIGH (ref 6–20)
CALCIUM: 9.1 mg/dL (ref 8.9–10.3)
CO2: 25 mmol/L (ref 22–32)
Chloride: 107 mmol/L (ref 101–111)
Creatinine, Ser: 1 mg/dL (ref 0.44–1.00)
GFR calc Af Amer: >60 mL/min (ref >60)
GFR calc non Af Amer: >60 mL/min (ref >60)
GLUCOSE: 191 mg/dL — AB (ref 65–99)
Potassium: 3.3 mmol/L — ABNORMAL LOW (ref 3.5–5.1)
SODIUM: 139 mmol/L (ref 135–145)
TOTAL PROTEIN: 6.5 g/dL (ref 6.5–8.1)
Total Bilirubin: 0.4 mg/dL (ref 0.3–1.2)

## 2015-12-27 LAB — CBC
HCT: 33.9 % — ABNORMAL LOW (ref 35.0–47.0)
HEMOGLOBIN: 11.3 g/dL — AB (ref 12.0–16.0)
MCH: 28 pg (ref 26.0–34.0)
MCHC: 33.5 g/dL (ref 32.0–36.0)
MCV: 83.6 fL (ref 80.0–100.0)
Platelets: 233 10*3/uL (ref 150–440)
RBC: 4.05 MIL/uL (ref 3.80–5.20)
RDW: 16.1 % — ABNORMAL HIGH (ref 11.5–14.5)
WBC: 6.8 10*3/uL (ref 3.6–11.0)

## 2015-12-27 LAB — URINE CULTURE: Culture: NO GROWTH

## 2015-12-27 LAB — GLUCOSE, CAPILLARY
GLUCOSE-CAPILLARY: 178 mg/dL — AB (ref 65–99)
GLUCOSE-CAPILLARY: 189 mg/dL — AB (ref 65–99)
GLUCOSE-CAPILLARY: 263 mg/dL — AB (ref 65–99)
Glucose-Capillary: 104 mg/dL — ABNORMAL HIGH (ref 65–99)

## 2015-12-27 MED ORDER — INSULIN ASPART 100 UNIT/ML ~~LOC~~ SOLN
0.0000 [IU] | Freq: Three times a day (TID) | SUBCUTANEOUS | Status: DC
  Administered 2015-12-27 (×2): 2 [IU] via SUBCUTANEOUS
  Administered 2015-12-27: 3 [IU] via SUBCUTANEOUS
  Administered 2015-12-28: 2 [IU] via SUBCUTANEOUS
  Filled 2015-12-27: qty 2
  Filled 2015-12-27: qty 3
  Filled 2015-12-27 (×2): qty 2

## 2015-12-27 MED ORDER — INSULIN ASPART 100 UNIT/ML ~~LOC~~ SOLN
5.0000 [IU] | Freq: Once | SUBCUTANEOUS | Status: AC
  Administered 2015-12-27: 5 [IU] via SUBCUTANEOUS
  Filled 2015-12-27: qty 5

## 2015-12-27 MED ORDER — POTASSIUM CHLORIDE CRYS ER 20 MEQ PO TBCR
40.0000 meq | EXTENDED_RELEASE_TABLET | Freq: Once | ORAL | Status: AC
  Administered 2015-12-27: 40 meq via ORAL
  Filled 2015-12-27: qty 2

## 2015-12-27 NOTE — Progress Notes (Signed)
[facility] [place] NAME: Susan House    MR#:  [id number]  DATE OF BIRTH:  1956-04-16  SUBJECTIVE: 60 year old female patient admitted for altered mental status found to have hypothermia /mild uti. she is more awake and oriented. According to family for the past 3 days She is confused.   CHIEF COMPLAINT:   Chief Complaint  Patient presents with  . Altered Mental Status    REVIEW OF SYSTEMS:    Review of Systems  Constitutional: Negative for fever and chills.  HENT: Negative for hearing loss.   Eyes: Negative for blurred vision, double vision and photophobia.  Respiratory: Negative for cough, hemoptysis and shortness of breath.   Cardiovascular: Negative for palpitations, orthopnea and leg swelling.  Gastrointestinal: Negative for vomiting, abdominal pain and diarrhea.  Genitourinary: Negative for dysuria and urgency.  Musculoskeletal: Negative for myalgias and neck pain.  Skin: Negative for rash.  Neurological: Negative for dizziness, focal weakness, seizures, weakness and headaches.  Psychiatric/Behavioral: Negative for memory loss. The patient does not have insomnia.    left-sided weakness present due to previous stroke.  Nutrition:  Tolerating Diet: Tolerating PT:      DRUG ALLERGIES:   Allergies  Allergen Reactions  . Morphine And Related Anaphylaxis  . Fentanyl Other (See Comments)    Unknown reaction.  . Reglan [Metoclopramide]   . Simvastatin Other (See Comments)    Muscle pain  . Betadine [Povidone Iodine] Rash  . Iodine Rash  . Tetracyclines & Related Rash    VITALS:  Blood pressure 135/54, pulse 64, temperature 98 F (36.7 C), temperature source Oral, resp. rate 18, height 5\' 1"  (1.549 m), weight 123.787 kg (272 lb 14.4 oz), SpO2 100 %.  PHYSICAL EXAMINATION:   Physical Exam  GENERAL:  60 y.o.-year-old patient lying in the bed with no acute distress.  EYES: Pupils equal, round, reactive to light  and accommodation. No scleral icterus. Extraocular muscles intact.  HEENT: Head atraumatic, normocephalic. Oropharynx and nasopharynx clear.  NECK:  Supple, no jugular venous distention. No thyroid enlargement, no tenderness.  LUNGS: Normal breath sounds bilaterally, no wheezing, rales,rhonchi or crepitation. No use of accessory muscles of respiration.  CARDIOVASCULAR: S1, S2 normal. No murmurs, rubs, or gallops.  ABDOMEN: Soft, nontender, nondistended. Bowel sounds present. No organomegaly or mass.  EXTREMITIES: No pedal edema, cyanosis, or clubbing.  NEUROLOGIC: Cranial nerves II through XII are intact. Decreased muscle strength on  the left side due to previous stroke. Uses  cane at baseline. PSYCHIATRIC: The patient is alert and oriented x 3.  SKIN: No obvious rash, lesion, or ulcer.    LABORATORY PANEL:   CBC  Recent Labs Lab 12/27/15 0432  WBC 6.8  HGB 11.3*  HCT 33.9*  PLT 233   ------------------------------------------------------------------------------------------------------------------  Chemistries   Recent Labs Lab 12/27/15 0432  NA 139  K 3.3*  CL 107  CO2 25  GLUCOSE 191*  BUN 28*  CREATININE 1.00  CALCIUM 9.1  AST 22  ALT 16  ALKPHOS 75  BILITOT 0.4   ------------------------------------------------------------------------------------------------------------------  Cardiac Enzymes  Recent Labs Lab 12/26/15 1202  TROPONINI <0.03   ------------------------------------------------------------------------------------------------------------------  RADIOLOGY:  Ct Head Wo Contrast  12/26/2015  CLINICAL DATA:  Increasing unresponsiveness. EXAM: CT HEAD WITHOUT CONTRAST TECHNIQUE: Contiguous axial images were obtained from the base of the skull through the vertex without intravenous contrast. COMPARISON:  Nov 23, 2015 CT scan of the brain FINDINGS: Paranasal sinuses, mastoid air cells, and middle ears  are normal. Bones and extracranial soft tissues are  unremarkable. No mass, mass effect, or midline shift. No subdural, epidural, or subarachnoid hemorrhage. The cerebellum, brainstem, and basal cisterns are normal. A remote left lenticulostriate infarct with ex vacuo dilatation of the frontal horn of the left lateral ventricle is stable. Scattered white matter changes are stable. No acute cortical ischemia or infarct. No other acute abnormalities. IMPRESSION: 1. No acute intracranial abnormalities identified. Electronically Signed   By: Dorise Bullion III M.D   On: 12/26/2015 12:39   Dg Chest Port 1 View  12/26/2015  CLINICAL DATA:  Acute mental status change. EXAM: PORTABLE CHEST 1 VIEW COMPARISON:  July 22, 2015 FINDINGS: Heart size not well evaluated due to portable technique. However, the heart appears to be at least borderline in size. The hila and mediastinum are normal. No pulmonary nodules, masses, or infiltrates. IMPRESSION: No active disease. Electronically Signed   By: Dorise Bullion III M.D   On: 12/26/2015 15:02     ASSESSMENT AND PLAN:   Principal Problem:   SIRS (systemic inflammatory response syndrome) (HCC) Active Problems:   Type 2 diabetes mellitus with kidney complication, without long-term current use of insulin (HCC)   UTI (urinary tract infection)   Encephalopathy acute  # SIRS on admission: Receiving  IV hydration. Clinically improving. Follow urine cultures. Continue empiric Rocephin.  #2 hyperthermia on admission improved. #3 acute metabolic encephalopathy secondary to SIRS improved. #4 diabetes mellitus type 2:  No Hypoglycemia. Blood glucose is well controlled. 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 #6 essential hypertension #7 hyperlipidemia D/w family,.  All the records are reviewed and case discussed with Care Management/Social Workerr. Management plans discussed with the patient, family and they  are in agreement.  CODE STATUS:full  TOTAL TIME TAKING CARE OF THIS PATIENT: 70minutes.   POSSIBLE D/C IN   1-2 DAYS, DEPENDING ON CLINICAL CONDITION.   Epifanio Lesches M.D on 12/27/2015 at 12:40 PM  Between 7am to 6pm - Pager - [phone]  After 6pm go to [email or web address] - password [email or web address] [facility]  Office  [phone]  CC: Primary care physician; Wilhemena Durie, MD

## 2015-12-27 NOTE — Evaluation (Signed)
Physical Therapy Evaluation Patient Details Name: Jamita Mckelvin MRN: [id number] DOB: July 19, 1955 Today's Date: 12/27/2015   History of Present Illness  Kiara Mcdowell is a 60yo white female who come [facility] on 6[facility]11 after 2d emesis and AMS: pt admitted with UTI, SIRS. PMH: COPD, DM, morbid obesity, remote CVA (2000) c Left hemiplegia, and now HLD. At baseline the pt performs household distance AMB c SPC, and community distances with electronic WC or shopping cart. 1 fall in the last 6 months, unexpectantly descending a single curb step.   Clinical Impression  Upon entry, the patient is received semirecumbent in bed, husband and daughter present. The pt is awake and agreeable to participate. No acute distress noted at this time. The pt is alert and oriented x3, pleasant, conversational, and following simple and multi-step commands consistently, although she does demonstrate some delay in response. Functional mobility assessment demonstrates mild-moderate weakness related to chronic CVA impairment, however the patient performs these with supervision and at her baseline. Pt is a high falls risk as demonstrated by multiple LOB in room during gait trials and transfer, as well as history of falls at home.   Patient presenting with impairment of strength, balance, and activity tolerance, limiting safety during performance of  ADL and mobility tasks. Patient will benefit from skilled intervention to address the above impairments and limitations, in order to improve patient safety upon discharge and to decrease falls risk. All further PT needs can be met at next venue of care. PT signing off.      Follow Up Recommendations Home health PT    Equipment Recommendations  None recommended by PT    Recommendations for Other Services       Precautions / Restrictions Precautions Precautions: Fall      Mobility  Bed Mobility Overal bed mobility: Needs Assistance Bed Mobility: Supine to Sit     Supine  to sit: Min assist     General bed mobility comments: Pt has a railing on be dat home, and typically exits on the opposite side.   Transfers Overall transfer level: Needs assistance Equipment used: Straight cane Transfers: Sit to/from Stand Sit to Stand: Min guard         General transfer comment: retropulsion upon initial stance, citing dizziness (a chronic issue); no LOB at second attempt.   Ambulation/Gait Ambulation/Gait assistance: Supervision Ambulation Distance (Feet): 30 Feet Assistive device: Straight cane       General Gait Details: chronic defciits noted, realted to LLE hemiplegia, pt reports to be at baseline, confirmed with family.   Stairs            Wheelchair Mobility    Modified Rankin (Stroke Patients Only)       Balance Overall balance assessment: History of Falls                                           Pertinent Vitals/Pain Pain Assessment: No/denies pain    Home Living Family/patient expects to be discharged to:: Private residence Living Arrangements: Spouse/significant other Available Help at Discharge: Family Type of Home: House Home Access: [place]: One [place]: Environmental consultant - 2 wheels;Walker - 4 wheels;Cane - single point;Wheelchair - manual;Wheelchair - power Photographer. )      Prior Function Level of Independence: Independent with assistive device(s)  Comments: additional time and AD needed; household distances only. Electronic cart or scooter/WC for community access.      Hand Dominance   Dominant Hand: Right    Extremity/Trunk Assessment   Upper Extremity Assessment: LUE deficits/detail (contracted with limited funcitonal use aside from some management of RW. )           Lower Extremity Assessment: LLE deficits/detail   LLE Deficits / Details: limited with chronic ER hypertonicicty, AMB with toe out and limited swing during gait.       Communication   Communication:  (mild delay in response. (at baseline))  Cognition Arousal/Alertness: Awake/alert Behavior During Therapy: WFL for tasks assessed/performed Overall Cognitive Status: Within Functional Limits for tasks assessed       Memory: Decreased recall of precautions              General Comments      Exercises        Assessment/Plan    PT Assessment All further PT needs can be met in the next venue of care  PT Diagnosis Difficulty walking;Generalized weakness;Other (comment) (unsteadiness on feet)   PT Problem List Decreased strength;Decreased range of motion;Decreased activity tolerance;Decreased balance;Decreased mobility;Obesity  PT Treatment Interventions     PT Goals (Current goals can be found in the Care Plan section) Acute Rehab PT Goals PT Goal Formulation: All assessment and education complete, DC therapy    Frequency     Barriers to discharge        Co-evaluation               End of Session Equipment Utilized During Treatment: Gait belt Activity Tolerance: Patient tolerated treatment well;Patient limited by fatigue;Patient limited by lethargy Patient left: in chair;with call bell/phone within reach;with chair alarm set;with family/visitor present Nurse Communication: Mobility status;Other (comment)         Time: 1610-9604 PT Time Calculation (min) (ACUTE ONLY): 22 min   Charges:   PT Evaluation $PT Eval Moderate Complexity: 1 Procedure PT Treatments $Therapeutic Activity: 8-22 mins   PT G Codes:       2:23 PM, Jan 03, 2016 Etta Grandchild, PT, DPT PRN Physical Therapist - [place] License # [id number] [phone] (mobile)

## 2015-12-28 ENCOUNTER — Inpatient Hospital Stay: Payer: Commercial Managed Care - HMO

## 2015-12-28 ENCOUNTER — Ambulatory Visit: Payer: Self-pay | Admitting: Family Medicine

## 2015-12-28 LAB — BASIC METABOLIC PANEL
ANION GAP: 9 (ref 5–15)
BUN: 24 mg/dL — AB (ref 6–20)
CALCIUM: 8.7 mg/dL — AB (ref 8.9–10.3)
CO2: 22 mmol/L (ref 22–32)
Chloride: 110 mmol/L (ref 101–111)
Creatinine, Ser: 0.98 mg/dL (ref 0.44–1.00)
GFR calc Af Amer: >60 mL/min (ref >60)
GLUCOSE: 126 mg/dL — AB (ref 65–99)
POTASSIUM: 3.9 mmol/L (ref 3.5–5.1)
SODIUM: 141 mmol/L (ref 135–145)

## 2015-12-28 LAB — GLUCOSE, CAPILLARY
GLUCOSE-CAPILLARY: 116 mg/dL — AB (ref 65–99)
Glucose-Capillary: 226 mg/dL — ABNORMAL HIGH (ref 65–99)
Glucose-Capillary: 95 mg/dL (ref 65–99)
Glucose-Capillary: 172 mg/dL — ABNORMAL HIGH (ref 65–99)

## 2015-12-28 MED ORDER — GADOBENATE DIMEGLUMINE 529 MG/ML IV SOLN
20.0000 mL | Freq: Once | INTRAVENOUS | Status: AC | PRN
  Administered 2015-12-28: 20 mL via INTRAVENOUS

## 2015-12-28 MED ORDER — BENZONATATE 100 MG PO CAPS
100.0000 mg | ORAL_CAPSULE | Freq: Three times a day (TID) | ORAL | Status: DC | PRN
  Administered 2015-12-28: 100 mg via ORAL
  Filled 2015-12-28: qty 1

## 2015-12-28 NOTE — Care Management Important Message (Signed)
Important Message  Patient Details  Name: Susan House MRN: [id number] Date of Birth: 04-01-1956   Medicare Important Message Given:  Yes    Juliann Pulse A Ruhan Borak 12/28/2015, 11:52 AM

## 2015-12-28 NOTE — Progress Notes (Signed)
Spoke with Wells Guiles, daughter of patient, regarding concerns about patient's mentation, MRI results, and discharge plan. Paged Dr. Vianne Bulls. Spoke with MD and MD able to speak with husband. MRI normal. Mentation improved and back to baseline prior to hospitalization. Patient and husband state, delayed response and emotional state is due to s/p CVA. I called Candice, another daughter, with patient's permission and expressed plan of care and plan to discharge patient this evening. Family all now in agreeance for plan for patient.

## 2015-12-28 NOTE — Care Management Note (Signed)
Case Management Note  Patient Details  Name: Susan House MRN: [id number] Date of Birth: [birth year]  Subjective/Objective:               Patient admitted with SIRS.  Patient lives at home with her husband.  Uses a cane for ambulation.   Patient states that both her and her husband drive.  Patient obtains her medications from [facility], and [employer].  Patient has 2 adult children who live locally for support.  PT has recommended home health pt   Action/Plan: MD to order home health PT and RN.  Patient was offered choice of Home health Agency.  [facility] was selected. Referral was called to [facility] with [facility].  RNCM signing off  Expected Discharge Date:                  Expected Discharge Plan:     In-House Referral:     Discharge planning Services     Post Acute Care Choice:    Choice offered to:     DME Arranged:    DME Agency:     HH Arranged:    [place] Agency:     Status of Service:     Medicare Important Message Given:  Yes Date Medicare IM Given:    Medicare IM give by:    Date Additional Medicare IM Given:    Additional Medicare Important Message give by:     If discussed at [place] of Stay Meetings, dates discussed:    Additional Comments:  Beverly Sessions, RN 12/28/2015, 4:07 PM

## 2015-12-28 NOTE — Progress Notes (Signed)
[facility] [place] NAME: Susan House    MR#:  [id number]  DATE OF BIRTH:  08/15/1955  SUBJECTIVE: 61 year old female patient admitted for altered mental status found to have hypothermia/MAS/improved with IV hydration and empiric abx.MRI brain is negative for new strokes.ptm feels much better.daught reports episodes of confusion early am.  CHIEF COMPLAINT:   Chief Complaint  Patient presents with  . Altered Mental Status    REVIEW OF SYSTEMS:    Review of Systems  Constitutional: Negative for fever and chills.  HENT: Negative for hearing loss.   Eyes: Negative for blurred vision, double vision and photophobia.  Respiratory: Negative for cough, hemoptysis and shortness of breath.   Cardiovascular: Negative for palpitations, orthopnea and leg swelling.  Gastrointestinal: Negative for vomiting, abdominal pain and diarrhea.  Genitourinary: Negative for dysuria and urgency.  Musculoskeletal: Negative for myalgias and neck pain.  Skin: Negative for rash.  Neurological: Negative for dizziness, focal weakness, seizures, weakness and headaches.  Psychiatric/Behavioral: Negative for memory loss. The patient does not have insomnia.    left-sided weakness present due to previous stroke.  Nutrition:  Tolerating Diet: Tolerating PT:      DRUG ALLERGIES:   Allergies  Allergen Reactions  . Morphine And Related Anaphylaxis  . Fentanyl Other (See Comments)    Unknown reaction.  . Reglan [Metoclopramide]   . Simvastatin Other (See Comments)    Muscle pain  . Betadine [Povidone Iodine] Rash  . Iodine Rash  . Tetracyclines & Related Rash    VITALS:  Blood pressure 151/57, pulse 87, temperature 97.5 F (36.4 C), temperature source Oral, resp. rate 16, height 5\' 1"  (1.549 m), weight 123.378 kg (272 lb), SpO2 100 %.  PHYSICAL EXAMINATION:   Physical Exam  GENERAL:  60 y.o.-year-old patient lying in the bed with no acute distress.   EYES: Pupils equal, round, reactive to light and accommodation. No scleral icterus. Extraocular muscles intact.  HEENT: Head atraumatic, normocephalic. Oropharynx and nasopharynx clear.  NECK:  Supple, no jugular venous distention. No thyroid enlargement, no tenderness.  LUNGS: Normal breath sounds bilaterally, no wheezing, rales,rhonchi or crepitation. No use of accessory muscles of respiration.  CARDIOVASCULAR: S1, S2 normal. No murmurs, rubs, or gallops.  ABDOMEN: Soft, nontender, nondistended. Bowel sounds present. No organomegaly or mass.  EXTREMITIES: No pedal edema, cyanosis, or clubbing.  NEUROLOGIC: Cranial nerves II through XII are intact. Decreased muscle strength on  the left side due to previous stroke. Uses  cane at baseline. PSYCHIATRIC: The patient is alert and oriented x 3.  SKIN: No obvious rash, lesion, or ulcer.    LABORATORY PANEL:   CBC  Recent Labs Lab 12/27/15 0432  WBC 6.8  HGB 11.3*  HCT 33.9*  PLT 233   ------------------------------------------------------------------------------------------------------------------  Chemistries   Recent Labs Lab 12/27/15 0432 12/28/15 0414  NA 139 141  K 3.3* 3.9  CL 107 110  CO2 25 22  GLUCOSE 191* 126*  BUN 28* 24*  CREATININE 1.00 0.98  CALCIUM 9.1 8.7*  AST 22  --   ALT 16  --   ALKPHOS 75  --   BILITOT 0.4  --    ------------------------------------------------------------------------------------------------------------------  Cardiac Enzymes  Recent Labs Lab 12/26/15 1202  TROPONINI <0.03   ------------------------------------------------------------------------------------------------------------------  RADIOLOGY:  Mr Kizzie Fantasia Contrast  12/28/2015  CLINICAL DATA:  60 year old female with confusion. Initial encounter. EXAM: MRI HEAD WITHOUT AND WITH CONTRAST TECHNIQUE: Multiplanar, multiecho pulse sequences of the brain and  surrounding structures were obtained without and with intravenous  contrast. CONTRAST:  85mL MULTIHANCE GADOBENATE DIMEGLUMINE 529 MG/ML IV SOLN COMPARISON:  Head CTs without contrast 12/26/2015 and earlier. Brain MRI 01/18/2014. FINDINGS: Major intracranial vascular flow voids are stable. No restricted diffusion to suggest acute infarction. No midline shift, mass effect, evidence of mass lesion, ventriculomegaly, extra-axial collection or acute intracranial hemorrhage. Cervicomedullary junction and pituitary are within normal limits. Chronic encephalomalacia in the left MCA territory with ex vacuo enlargement of the left frontal horn. Mild associated hemosiderin at the level of the left basal ganglia. No associated cortical encephalomalacia. Superimposed chronic lacunar infarct of the posterior limb right internal capsule tracking into the right midbrain. Associated right greater than left Wallerian degeneration in the brainstem. Superimposed mild T2 heterogeneity elsewhere in the deep gray matter nuclei. Overall stable gray and white matter signal since 2015. No other chronic cerebral blood products. Cerebellum remains normal. No abnormal enhancement identified. No dural thickening. Visible internal auditory structures appear normal. Mastoids remain clear. Minor paranasal sinus mucosal thickening is stable. Postoperative changes to both globes. Negative orbit and scalp soft tissues otherwise. Normal bone marrow signal. IMPRESSION: 1.  No acute intracranial abnormality. 2. Stable MRI appearance the brain since 2015 with chronic left deep gray matter and right internal capsule infarcts with Wallerian degeneration and ex vacuo left ventricle enlargement. Electronically Signed   By: Genevie Ann M.D.   On: 12/28/2015 12:06     ASSESSMENT AND PLAN:   Principal Problem:   SIRS (systemic inflammatory response syndrome) (HCC) Active Problems:   Type 2 diabetes mellitus with kidney complication, without long-term current use of insulin (HCC)   UTI (urinary tract infection)    Encephalopathy acute  # SIRS on admission: Receiving  IV hydration. Clinically improving.,urine cultures showed no infection,m.  #2 hypothermia on admission improved. #3 acute metabolic encephalopathy secondary to SIRS improved. #4 diabetes mellitus type 2:  No Hypoglycemia. Blood glucose is well controlled. 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 Management plans discussed with the patient, family and they are in agreement.  CODE STATUS:full  TOTAL TIME TAKING CARE OF THIS PATIENT: 62minutes.    Epifanio Lesches M.D on 12/28/2015 at 3:43 PM  Between 7am to 6pm - Pager - [phone]  After 6pm go to [email or web address] - password [email or web address]  Office  [phone]  CC: Primary care physician; Wilhemena Durie, MD

## 2015-12-29 ENCOUNTER — Encounter: Payer: Self-pay | Admitting: Family Medicine

## 2015-12-29 ENCOUNTER — Ambulatory Visit (INDEPENDENT_AMBULATORY_CARE_PROVIDER_SITE_OTHER): Payer: Commercial Managed Care - HMO | Admitting: Family Medicine

## 2015-12-29 VITALS — BP 148/62 | HR 98 | Temp 97.3°F | Resp 18

## 2015-12-29 DIAGNOSIS — Z09 Encounter for follow-up examination after completed treatment for conditions other than malignant neoplasm: Secondary | ICD-10-CM | POA: Diagnosis not present

## 2015-12-29 DIAGNOSIS — L259 Unspecified contact dermatitis, unspecified cause: Secondary | ICD-10-CM | POA: Diagnosis not present

## 2015-12-29 DIAGNOSIS — R6510 Systemic inflammatory response syndrome (SIRS) of non-infectious origin without acute organ dysfunction: Secondary | ICD-10-CM

## 2015-12-29 DIAGNOSIS — J690 Pneumonitis due to inhalation of food and vomit: Secondary | ICD-10-CM | POA: Diagnosis not present

## 2015-12-29 DIAGNOSIS — Z794 Long term (current) use of insulin: Secondary | ICD-10-CM

## 2015-12-29 DIAGNOSIS — E118 Type 2 diabetes mellitus with unspecified complications: Secondary | ICD-10-CM

## 2015-12-29 DIAGNOSIS — G9340 Encephalopathy, unspecified: Secondary | ICD-10-CM | POA: Diagnosis not present

## 2015-12-29 LAB — GLUCOSE, POCT (MANUAL RESULT ENTRY): POC Glucose: 117 mg/dl — AB (ref 70–99)

## 2015-12-29 MED ORDER — TRIAMCINOLONE ACETONIDE 0.1 % EX CREA: 1.0000 "application " | TOPICAL_CREAM | Freq: Two times a day (BID) | CUTANEOUS | Status: DC

## 2015-12-29 MED ORDER — AMOXICILLIN-POT CLAVULANATE 875-125 MG PO TABS: 1.0000 | ORAL_TABLET | Freq: Two times a day (BID) | ORAL | Status: DC
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
## 2015-12-29 NOTE — Telephone Encounter (Signed)
Pt was at [facility] and is been followed up with today at the office, will [place]

## 2015-12-29 NOTE — Discharge Summary (Signed)
Susan House, is a 60 y.o. female  DOB 1956-04-11  MRN [id number].  Admission date:  12/26/2015  Admitting Physician  Susan Crouch, MD  Discharge Date:  12/28/2015   Primary MD  Susan Durie, MD  Recommendations for primary care physician for things to follow:   Follow  up  With his primary doctor in one week Follow up with primary psychiatrist also in 1 week.   Admission Diagnosis  UTI (lower urinary tract infection) [N39.0] Altered mental status, unspecified altered mental status type [R41.82]   Discharge Diagnosis  UTI (lower urinary tract infection) [N39.0] Altered mental status, unspecified altered mental status type [R41.82]    Principal Problem:   SIRS (systemic inflammatory response syndrome) (HCC) Active Problems:   Type 2 diabetes mellitus with kidney complication, without long-term current use of insulin (HCC)   UTI (urinary tract infection)   Encephalopathy acute      Past Medical History  Diagnosis Date  . Cellulitis and abscess of face   . Allergy   . Depression   . Hypertension   . Edema   . Arthritis   . Migraine   . GERD (gastroesophageal reflux disease)   . Stroke ([place]) 2000  . Diabetes mellitus ([place])   . Hyperlipidemia   . Morbid obesity ([place])   . IBS (irritable bowel syndrome)   . Urinary incontinence   . History of nuclear stress test     a. 12/2009: lexiscan - negative  . TIA (transient ischemic attack) 2010  . History of echocardiogram     2008: normal, 2011: normal LVSF  . Hematuria   . Nocturia   . Urgency of micturation   . Urinary frequency     Past Surgical History  Procedure Laterality Date  . Colonoscopy  2010  . Appendectomy    . Hernia repair    . Eye surgery    . Upper gi endoscopy    . Carpel tunnel surgery    . Back surgery      extensive  spinal fusion  . Shoulder surgery    . Wrist fracture surgery Left   . Abdominoplasty    . Toe surgery    . Abdominal hysterectomy    . Cataract extraction      x 2  . Cholecystectomy    . Cesarean section         History of present illness and  Hospital Course:     Kindly see H&P for history of present illness and admission details, please review complete Labs, Consult reports and Test reports for all details in brief  HPI  from the history and physical done on the day of admission  60 year old female patient with history of depression, essential hypertension, history of previous stroke and left-sided weakness, diabetes mellitus, and because of vomiting for 2 days and also confusion for about 2-3 days. Patient temperature was low in the emergency room, Is 96.3 Fahrenheit in the emergency room. She is admitted for SIRS with hypothermia, encephalopathy. Hospital Course  1 acute encephalopathy secondary to hypothermia and SIRS on admission. Treated with IV hydration, blood cultures, urine cultures did not show any infection. Patient empirically started on IV Zosyn. Did not require any home antibiotics as culture data did not show any infection. Cbc ,,  bmp are  within normal limits Patient mental status improved the following day with IV hydration. Patient also had MRI of the brain because of previous history of stroke and also confusion.  MRI of the brain did not show any acute stroke. Physical therapy recommended home health physical therapy. Discharge home with home health physical therapy and RN. #2 number to hypothermia improved. 3,Acute metabolic encephalopathy secondary to SIRS improved. Patient has history of previous stroke with left-sided weakness and also gets emotional on and off. Advised the patient and patient's husband and patient's daughter to make sure she follows up with primary doctor. He outpatient to workup did not show any abnormalities in electrolytes, white count,  culture data, no concern for any drug overdose because patient and the family denied  taking any extra doses of pills..  #4 hyperlipidemia continue status. #5 history of previous stroke with left-sided weakness patient does use cane at baseline. She is on aspirin, statins, Plavix. Diabetes mellitus type 2: On metformin 1 g by mouth daily: Continue that.   Discharge Condition: Stable Follow UP      Discharge Instructions  and  Discharge Medications     Discharge Instructions    Face-to-face encounter (required for Medicare/Medicaid patients)    Complete by:  As directed   I Susan House certify that this patient is under my care and that I, or a nurse practitioner or physician's assistant working with me, had a face-to-face encounter that meets the physician face-to-face encounter requirements with this patient on 12/28/2015. The encounter with the patient was in whole, or in part for the following medical condition(s) which is the primary reason for home health care H/o CVA htn DMII  The encounter with the patient was in whole, or in part, for the following medical condition, which is the primary reason for home health care:  whole  I certify that, based on my findings, the following services are medically necessary home health services:   Nursing Physical therapy    Reason for Medically Necessary Home Health Services:  Therapy- Personnel officer, Public librarian  My clinical findings support the need for the above services:  Unable to leave home safely without assistance and/or assistive device  Further, I certify that my clinical findings support that this patient is homebound due to:  Unsafe ambulation due to balance issues     Home Health    Complete by:  As directed   To provide the following care/treatments:   PT RN              Medication List    STOP taking these medications        cyclobenzaprine 5 MG tablet  Commonly known as:  FLEXERIL      meclizine 25 MG tablet  Commonly known as:  ANTIVERT      TAKE these medications        ACCU-CHEK AVIVA PLUS w/Device Kit  Check sugar three times daily DX E11.9-needs a meter     accu-chek softclix lancets  Check sugar three times daily DX E11.9     amLODipine 10 MG tablet  Commonly known as:  NORVASC  Take 1 tablet (10 mg total) by mouth daily.     aspirin 325 MG tablet  Take 325 mg by mouth daily.     atorvastatin 20 MG tablet  Commonly known as:  LIPITOR  Take 1 tablet (20 mg total) by mouth at bedtime.     baclofen 20 MG tablet  Commonly known as:  LIORESAL  Take 20 mg by mouth 2 (two) times daily.     clopidogrel 75 MG tablet  Commonly known as:  PLAVIX  TAKE 1  TABLET EVERY DAY     Cranberry 1000 MG Caps  Take 2,000 mg by mouth daily.     enalapril 20 MG tablet  Commonly known as:  VASOTEC  Take 20 mg by mouth 2 (two) times daily.     gabapentin 100 MG capsule  Commonly known as:  NEURONTIN  Take 1 capsule (100 mg total) by mouth 3 (three) times daily.     glucose blood test strip  Commonly known as:  ACCU-CHEK AVIVA  Check sugar three times daily DX E11.9     hydrochlorothiazide 25 MG tablet  Commonly known as:  HYDRODIURIL  TAKE 1 TABLET EVERY DAY     HYDROcodone-acetaminophen 10-325 MG tablet  Commonly known as:  NORCO  Take 1-2 tablets by mouth every 4 (four) hours as needed.     insulin aspart protamine- aspart (70-30) 100 UNIT/ML injection  Commonly known as:  NOVOLOG MIX 70/30  Inject 25-30 Units into the skin 2 (two) times daily.     magnesium oxide 400 (241.3 Mg) MG tablet  Commonly known as:  MAG-OX  Take 1 tablet (400 mg total) by mouth daily.     metFORMIN 1000 MG tablet  Commonly known as:  GLUCOPHAGE  TAKE 1 TABLET TWICE DAILY     nitroGLYCERIN 0.4 MG SL tablet  Commonly known as:  NITROSTAT  Place 1 tablet (0.4 mg total) under the tongue every 5 (five) minutes as needed for chest pain.     oxybutynin 5 MG 24 hr tablet   Commonly known as:  DITROPAN-XL  Take 1 tablet (5 mg total) by mouth daily.     pantoprazole 40 MG tablet  Commonly known as:  PROTONIX  Take 1 tablet (40 mg total) by mouth daily.          Diet and Activity recommendation: See Discharge Instructions above   Consults obtained -  PT   Major procedures and Radiology Reports - PLEASE review detailed and final reports for all details, in brief -      Ct Abdomen Pelvis Wo Contrast  12/23/2015  CLINICAL DATA:  Left lower quadrant pain EXAM: CT ABDOMEN AND PELVIS WITHOUT CONTRAST TECHNIQUE: Multidetector CT imaging of the abdomen and pelvis was performed following the standard protocol without IV contrast. COMPARISON:  06/10/2014 FINDINGS: Lower chest and abdominal wall: Small fatty umbilical hernia. Lower abdominal wall scarring Bulky mitral annular calcification/ossification. Coronary atherosclerotic calcification. Hepatobiliary: No focal liver abnormality.Cholecystectomy with normal common bile duct diameter. Pancreas: Unremarkable. Spleen: Unremarkable. Adrenals/Urinary Tract: Negative adrenals. Punctate stone in the lower pole left kidney, nonobstructive. No hydronephrosis or ureteral calculus. Hazy 7 mm high-density area within the medullary region of the interpolar right kidney, favor hemorrhagic cyst. Mildly thick appearance of the bladder but underdistended. No surrounding inflammatory changes. Reproductive:Hysterectomy and probable oophorectomies. No adnexal mass. Stomach/Bowel:  No obstruction. No inflammation.  Appendectomy. Vascular/Lymphatic: Extensive aortic and branch vessel atherosclerosis, especially for age. No mass or adenopathy. Peritoneal: No ascites or pneumoperitoneum. Musculoskeletal: No acute finding. L4-5 discectomy with solid bony fusion. IMPRESSION: 1. No acute finding to explain left lower quadrant pain. 2. Small, nonobstructing left nephrolithiasis. Electronically Signed   By: Monte Fantasia M.D.   On: 12/23/2015  01:57   Ct Head Wo Contrast  12/26/2015  CLINICAL DATA:  Increasing unresponsiveness. EXAM: CT HEAD WITHOUT CONTRAST TECHNIQUE: Contiguous axial images were obtained from the base of the skull through the vertex without intravenous contrast. COMPARISON:  Nov 23, 2015 CT scan of the brain FINDINGS: Paranasal  sinuses, mastoid air cells, and middle ears are normal. Bones and extracranial soft tissues are unremarkable. No mass, mass effect, or midline shift. No subdural, epidural, or subarachnoid hemorrhage. The cerebellum, brainstem, and basal cisterns are normal. A remote left lenticulostriate infarct with ex vacuo dilatation of the frontal horn of the left lateral ventricle is stable. Scattered white matter changes are stable. No acute cortical ischemia or infarct. No other acute abnormalities. IMPRESSION: 1. No acute intracranial abnormalities identified. Electronically Signed   By: Dorise Bullion III M.D   On: 12/26/2015 12:39   Mr Jeri Cos Wo Contrast  12/28/2015  CLINICAL DATA:  60 year old female with confusion. Initial encounter. EXAM: MRI HEAD WITHOUT AND WITH CONTRAST TECHNIQUE: Multiplanar, multiecho pulse sequences of the brain and surrounding structures were obtained without and with intravenous contrast. CONTRAST:  17m MULTIHANCE GADOBENATE DIMEGLUMINE 529 MG/ML IV SOLN COMPARISON:  Head CTs without contrast 12/26/2015 and earlier. Brain MRI 01/18/2014. FINDINGS: Major intracranial vascular flow voids are stable. No restricted diffusion to suggest acute infarction. No midline shift, mass effect, evidence of mass lesion, ventriculomegaly, extra-axial collection or acute intracranial hemorrhage. Cervicomedullary junction and pituitary are within normal limits. Chronic encephalomalacia in the left MCA territory with ex vacuo enlargement of the left frontal horn. Mild associated hemosiderin at the level of the left basal ganglia. No associated cortical encephalomalacia. Superimposed chronic lacunar  infarct of the posterior limb right internal capsule tracking into the right midbrain. Associated right greater than left Wallerian degeneration in the brainstem. Superimposed mild T2 heterogeneity elsewhere in the deep gray matter nuclei. Overall stable gray and white matter signal since 2015. No other chronic cerebral blood products. Cerebellum remains normal. No abnormal enhancement identified. No dural thickening. Visible internal auditory structures appear normal. Mastoids remain clear. Minor paranasal sinus mucosal thickening is stable. Postoperative changes to both globes. Negative orbit and scalp soft tissues otherwise. Normal bone marrow signal. IMPRESSION: 1.  No acute intracranial abnormality. 2. Stable MRI appearance the brain since 2015 with chronic left deep gray matter and right internal capsule infarcts with Wallerian degeneration and ex vacuo left ventricle enlargement. Electronically Signed   By: HGenevie AnnM.D.   On: 12/28/2015 12:06   Dg Chest Port 1 View  12/26/2015  CLINICAL DATA:  Acute mental status change. EXAM: PORTABLE CHEST 1 VIEW COMPARISON:  July 22, 2015 FINDINGS: Heart size not well evaluated due to portable technique. However, the heart appears to be at least borderline in size. The hila and mediastinum are normal. No pulmonary nodules, masses, or infiltrates. IMPRESSION: No active disease. Electronically Signed   By: DDorise BullionIII M.D   On: 12/26/2015 15:02    Micro Results     Recent Results (from the past 240 hour(s))  Urine culture     Status: None   Collection Time: 12/26/15 12:02 PM  Result Value Ref Range Status   Specimen Description URINE, CATHETERIZED  Final   Special Requests NONE  Final   Culture [facility] GR[facility]TH [facility]  Final   Report Status 12/27/2015 FINAL  Final  Culture, blood (routine x 2)     Status: None (Preliminary result)   Collection Time: 12/26/15  3:33 PM  Result Value Ref Range Status   Specimen Description  BLOOD RIGHT HAND  Final   Special Requests BOTTLES DRAWN AEROBIC AND ANAEROBIC  1CC  Final   Culture NO GROWTH 3 DAYS  Final   Report Status PENDING  Incomplete  Culture, blood (routine x  2)     Status: None (Preliminary result)   Collection Time: 12/26/15  3:33 PM  Result Value Ref Range Status   Specimen Description BLOOD RIGHT ARM  Final   Special Requests BOTTLES DRAWN AEROBIC AND ANAEROBIC 1CC  Final   Culture NO GROWTH 3 DAYS  Final   Report Status PENDING  Incomplete       Today   Subjective:   Susan House today has no headache,no chest abdominal pain,no new weakness tingling or numbness, feels much better wants to go home today.   Objective:   Blood pressure 151/57, pulse 87, temperature 97.5 F (36.4 C), temperature source Oral, resp. rate 16, height 5' 1"  (1.549 m), weight 123.378 kg (272 lb), SpO2 100 %.  No intake or output data in the 24 hours ending 12/29/15 1003  Exam Awake Alert, Oriented x 3, No new F.N deficits, Normal affect [place].AT,PERRAL Supple Neck,No JVD, No cervical lymphadenopathy appriciated.  Symmetrical Chest wall movement, Good air movement bilaterally, CTAB RRR,No Gallops,Rubs or new Murmurs, No Parasternal Heave +ve B.Sounds, Abd Soft, Non tender, No organomegaly appriciated, No rebound -guarding or rigidity. No Cyanosis, Clubbing or edema, No new Rash or bruise  Data Review   CBC w Diff:  Lab Results  Component Value Date   WBC 6.8 12/27/2015   WBC 10.1 11/26/2015   WBC 8.2 06/17/2014   HGB 11.3* 12/27/2015   HGB 10.6* 06/17/2014   HCT 33.9* 12/27/2015   HCT 38.5 11/26/2015   HCT 34.0* 06/17/2014   PLT 233 12/27/2015   PLT 330 11/26/2015   PLT 206 06/17/2014   LYMPHOPCT 10 12/26/2015   LYMPHOPCT 17.7 01/18/2014   MONOPCT 7 12/26/2015   MONOPCT 8.4 01/18/2014   EOSPCT 2 12/26/2015   EOSPCT 5.4 01/18/2014   BASOPCT 1 12/26/2015   BASOPCT 0.6 01/18/2014    CMP:  Lab Results  Component Value Date   NA 141 12/28/2015   NA  143 11/26/2015   NA 140 06/17/2014   K 3.9 12/28/2015   K 4.1 06/17/2014   CL 110 12/28/2015   CL 105 06/17/2014   CO2 22 12/28/2015   CO2 27 06/17/2014   BUN 24* 12/28/2015   BUN 32* 11/26/2015   BUN 35* 06/17/2014   CREATININE 0.98 12/28/2015   CREATININE 1.29 06/17/2014   CREATININE 1.4* 03/16/2014   GLU 249 03/16/2014   PROT 6.5 12/27/2015   PROT 7.1 11/26/2015   PROT 6.5 01/18/2014   ALBUMIN 3.5 12/27/2015   ALBUMIN 4.5 11/26/2015   ALBUMIN 3.0* 01/18/2014   BILITOT 0.4 12/27/2015   BILITOT 0.2 11/26/2015   BILITOT 0.3 01/18/2014   ALKPHOS 75 12/27/2015   ALKPHOS 97 01/18/2014   AST 22 12/27/2015   AST 21 01/18/2014   ALT 16 12/27/2015   ALT 18 01/18/2014  .   Total Time in preparing paper work, data evaluation and todays exam - 46 minutes  Susan House M.D on 12/28/2015 at 10:03 AM    Note: This dictation was prepared with Dragon dictation along with smaller phrase technology. Any transcriptional errors that result from this process are unintentional.

## 2015-12-31 LAB — CULTURE, BLOOD (ROUTINE X 2)
CULTURE: NO GROWTH
Culture: NO GROWTH

## 2016-01-03 ENCOUNTER — Encounter: Payer: Self-pay | Admitting: Urology

## 2016-01-03 NOTE — Progress Notes (Signed)
4:35 PM  11/23/15  Susan House 12-Oct-1955 [id number]  Referring provider: Jerrol Banana., MD [address] [place], [place] [address]  Chief Complaint  Patient presents with  . Nephrolithiasis    HPI: 60 yo F with recurrent UTIs who returns to the office today after being seen in the ED on 12/22/15 with left lower quadrant pain/ left flank pain.  She underwent further workup with a noncontrast CT scan. This showed a stable left 2 mm lower pole stone which is unchanged from 2 years ago.  No UA/ UCx was obtained during this ED visit.   Otherwise, it was fairly unremarkable.  She was told to follow up with urology for her kidney stone and thus presents today.  Her discomfort has since resolved.  Review of urine culture data reveals MSSA, Kelbsiella, Proteus, E. Coli over past year with variable sensitivity pattern.  Most recent infection on 12/16/2015 with Citrobacter.  Baseline urinary symptoms include incontitnence following stroke 2000.  Previous PVRs minimal.  Personal history of DM.  No personal history of passing kidney stones.  + FH of stones.    Last cysto 2015 unremarkable.    She is taking her cranberry tablets but is not using her estrogen cream on a regular basis.   PMH: Past Medical History  Diagnosis Date  . Cellulitis and abscess of face   . Allergy   . Depression   . Hypertension   . Edema   . Arthritis   . Migraine   . GERD (gastroesophageal reflux disease)   . Stroke ([place]) 2000  . Diabetes mellitus ([place])   . Hyperlipidemia   . Morbid obesity ([place])   . IBS (irritable bowel syndrome)   . Urinary incontinence   . History of nuclear stress test     a. 12/2009: lexiscan - negative  . TIA (transient ischemic attack) 2010  . History of echocardiogram     2008: normal, 2011: normal LVSF  . Hematuria   . Nocturia   . Urgency of micturation   . Urinary frequency     Surgical History: Past Surgical History  Procedure Laterality  Date  . Colonoscopy  2010  . Appendectomy    . Hernia repair    . Eye surgery    . Upper gi endoscopy    . Carpel tunnel surgery    . Back surgery      extensive spinal fusion  . Shoulder surgery    . Wrist fracture surgery Left   . Abdominoplasty    . Toe surgery    . Abdominal hysterectomy    . Cataract extraction      x 2  . Cholecystectomy    . Cesarean section      Home Medications:    Medication List       This list is accurate as of: 12/24/15 11:59 PM.  Always use your most recent med list.               ACCU-CHEK AVIVA PLUS w/Device Kit  Check sugar three times daily DX E11.9-needs a meter     accu-chek softclix lancets  Check sugar three times daily DX E11.9     amLODipine 10 MG tablet  Commonly known as:  NORVASC  Take 1 tablet (10 mg total) by mouth daily.     aspirin 325 MG tablet  Take 325 mg by mouth daily.     atorvastatin 20 MG tablet  Commonly known as:  LIPITOR  Take 1 tablet (20 mg total) by mouth at bedtime.     baclofen 20 MG tablet  Commonly known as:  LIORESAL  Take 20 mg by mouth 2 (two) times daily.     clopidogrel 75 MG tablet  Commonly known as:  PLAVIX  TAKE 1 TABLET EVERY DAY     Cranberry 1000 MG Caps  Take 2,000 mg by mouth daily.     cyclobenzaprine 5 MG tablet  Commonly known as:  FLEXERIL  Take 1 tablet (5 mg total) by mouth 3 (three) times daily as needed for muscle spasms.     enalapril 20 MG tablet  Commonly known as:  VASOTEC  Take 20 mg by mouth 2 (two) times daily.     gabapentin 100 MG capsule  Commonly known as:  NEURONTIN  Take 1 capsule (100 mg total) by mouth 3 (three) times daily.     glucose blood test strip  Commonly known as:  ACCU-CHEK AVIVA  Check sugar three times daily DX E11.9     hydrochlorothiazide 25 MG tablet  Commonly known as:  HYDRODIURIL  TAKE 1 TABLET EVERY DAY     HYDROcodone-acetaminophen 10-325 MG tablet  Commonly known as:  NORCO  Take 1-2 tablets by mouth every 4 (four)  hours as needed.     insulin aspart protamine- aspart (70-30) 100 UNIT/ML injection  Commonly known as:  NOVOLOG MIX 70/30  Inject 25-30 Units into the skin 2 (two) times daily.     magnesium oxide 400 (241.3 Mg) MG tablet  Commonly known as:  MAG-OX  Take 1 tablet (400 mg total) by mouth daily.     meclizine 25 MG tablet  Commonly known as:  ANTIVERT  Take 1 tablet (25 mg total) by mouth 2 (two) times daily.     metFORMIN 1000 MG tablet  Commonly known as:  GLUCOPHAGE  TAKE 1 TABLET TWICE DAILY     nitroGLYCERIN 0.4 MG SL tablet  Commonly known as:  NITROSTAT  Place 1 tablet (0.4 mg total) under the tongue every 5 (five) minutes as needed for chest pain.     oxybutynin 5 MG 24 hr tablet  Commonly known as:  DITROPAN-XL  Take 1 tablet (5 mg total) by mouth daily.     pantoprazole 40 MG tablet  Commonly known as:  PROTONIX  Take 1 tablet (40 mg total) by mouth daily.        Allergies:  Allergies  Allergen Reactions  . Morphine And Related Anaphylaxis  . Fentanyl Other (See Comments)    Unknown reaction.  . Reglan [Metoclopramide]   . Simvastatin Other (See Comments)    Muscle pain  . Betadine [Povidone Iodine] Rash  . Iodine Rash  . Tetracyclines & Related Rash    Family History: Family History  Problem Relation Age of Onset  . Hyperlipidemia Mother   . Arrhythmia Mother     WPW  . Heart attack Father 15  . Hyperlipidemia Father   . Hypertension Father   . Hypertension Mother   . Rheum arthritis Mother   . Heart disease Mother   . Stroke Father   . Diabetes Father   . Heart disease Father   . Coronary artery disease Father   . Diabetes Sister   . Hyperlipidemia Sister   . Hypertension Sister   . Depression Sister   . Depression Sister   . Diabetes Sister   . Hypertension Sister   . Kidney disease Paternal Grandmother  Social History:  reports that she has never smoked. She has never used smokeless tobacco. She reports that she does not drink  alcohol or use illicit drugs.  ROS: UROLOGY Frequent Urination?: No Hard to postpone urination?: No Burning/pain with urination?: No Get up at night to urinate?: Yes Leakage of urine?: Yes Urine stream starts and stops?: No Trouble starting stream?: No Do you have to strain to urinate?: No Blood in urine?: No Urinary tract infection?: No Sexually transmitted disease?: No Injury to kidneys or bladder?: No Painful intercourse?: No Weak stream?: No Currently pregnant?: No Last menstrual period?: n  Gastrointestinal Nausea?: No Vomiting?: No Indigestion/heartburn?: No Diarrhea?: No Constipation?: No  Constitutional Fever: No Night sweats?: No Weight loss?: No Fatigue?: No  Skin Skin rash/lesions?: No Itching?: No  Eyes Blurred vision?: No Double vision?: No  Ears/Nose/Throat Sore throat?: No Sinus problems?: No  Hematologic/Lymphatic Swollen glands?: No Easy bruising?: No  Cardiovascular Leg swelling?: No Chest pain?: No  Respiratory Cough?: No Shortness of breath?: No  Endocrine Excessive thirst?: No  Musculoskeletal Back pain?: No Joint pain?: No  Neurological Headaches?: No Dizziness?: No  Psychologic Depression?: No Anxiety?: No  Physical Exam: BP 134/74 mmHg  Pulse 99  Ht _0  (1.549 m)  Wt 272 lb (123.378 kg)  BMI 51.42 kg/m2  Constitutional:  Alert and oriented, No acute distress.  Presents with husband today. HEENT: [place] AT, moist mucus membranes.  Trachea midline, no masses. Cardiovascular: No clubbing, cyanosis, or edema. Respiratory: Normal respiratory effort, no increased work of breathing. GI: Abdomen is soft, nontender, nondistended, no abdominal masses.  Morbidly obese.   GU: No CVA tenderness.  Skin: No rashes, bruises or suspicious lesions. Lymph: No cervical or inguinal adenopathy. Neurologic: Grossly intact, no focal deficits, moving all 4 extremities. Psychiatric: Normal mood and affect.  Laboratory  Data: Component     Latest Ref Rng 12/22/2015          Glucose     65 - 99 mg/dL 232 (H)  BUN     6 - 20 mg/dL 23 (H)  Creatinine     0.44 - 1.00 mg/dL 1.31 (H)  Sodium     135 - 145 mmol/L 137  Potassium     3.5 - 5.1 mmol/L 3.9  Chloride     101 - 111 mmol/L 106  CO2     22 - 32 mmol/L 18 (L)  Calcium     8.9 - 10.3 mg/dL 9.3  AST     15 - 41 U/L 28  ALT     14 - 54 U/L 16  Alkaline Phosphatase     38 - 126 U/L 79  Albumin     3.5 - 5.0 g/dL 4.0  Total Protein     6.5 - 8.1 g/dL 7.2  Total Bilirubin     0.3 - 1.2 mg/dL 0.6  Anion gap     5 - 15 13  EGFR (African American)     >60 mL/min 51 (L)  EGFR (Non-African Amer.)     >60 mL/min 44 (L)    Urinalysis Currently asymptomatic, we'll defer urinalysis today  UCx 12/16/15 Component     Latest Ref Rng 12/16/2015  Urine Culture, Routine      Final report (A)  Urine Culture result 1      Citrobacter freundii (A)  Antimicrobial Susceptibility      Comment    Pertinent Imaging: Previous CT san abd/ pelvis 05/2014 reviewed 2 mm LLP stone -  stable from 2015  Study Result     CLINICAL DATA: Left lower quadrant pain  EXAM: CT ABDOMEN AND PELVIS WITHOUT CONTRAST  TECHNIQUE: Multidetector CT imaging of the abdomen and pelvis was performed following the standard protocol without IV contrast.  COMPARISON: 06/10/2014  FINDINGS: Lower chest and abdominal wall: Small fatty umbilical hernia. Lower abdominal wall scarring  Bulky mitral annular calcification/ossification. Coronary atherosclerotic calcification.  Hepatobiliary: No focal liver abnormality.Cholecystectomy with normal common bile duct diameter.  Pancreas: Unremarkable.  Spleen: Unremarkable.  Adrenals/Urinary Tract: Negative adrenals. Punctate stone in the lower pole left kidney, nonobstructive. No hydronephrosis or ureteral calculus. Hazy 7 mm high-density area within the medullary region of the interpolar right kidney, favor  hemorrhagic cyst. Mildly thick appearance of the bladder but underdistended. No surrounding inflammatory changes.  Reproductive:Hysterectomy and probable oophorectomies. No adnexal mass.  Stomach/Bowel: No obstruction. No inflammation. Appendectomy.  Vascular/Lymphatic: Extensive aortic and branch vessel atherosclerosis, especially for age. No mass or adenopathy.  Peritoneal: No ascites or pneumoperitoneum.  Musculoskeletal: No acute finding. L4-5 discectomy with solid bony fusion.  IMPRESSION: 1. No acute finding to explain left lower quadrant pain. 2. Small, nonobstructing left nephrolithiasis.   Electronically Signed  By: Monte Fantasia M.D.  On: 12/23/2015 01:57     Assessment & Plan:    1. Recurrent UTI Reviewed with the patient that when she asymptomatic, she will need to be catheterized to obtain an accurate UA/urine culture given her habitus.  I suspect many of her infections are likely contaminants and not true infections.  Additionally, we reviewed hygiene issues again today along with importance of vaginal estrogen cream. She is inconsistent with the usage.  2. Nonobstructing 2 mm LLP stone Stable in size since 2015, CT essentially unchanged No intervention recommended No source of flank pain or and unlikely to be nidus recurrent infections  2. Atrophic vaginitis History of atrophic vaginitis- has not been using cream again Reinforced this again today  3. Urge incontinence of urine Stable, baseline since stroke.     Return for in 05/2016 as previously scheduled.  Hollice Espy, MD  [facility] [address], [place], [place] [address] [phone]

## 2016-01-05 ENCOUNTER — Ambulatory Visit (INDEPENDENT_AMBULATORY_CARE_PROVIDER_SITE_OTHER): Payer: Commercial Managed Care - HMO | Admitting: Family Medicine

## 2016-01-05 ENCOUNTER — Telehealth: Payer: Self-pay | Admitting: Family Medicine

## 2016-01-05 VITALS — BP 132/64 | HR 98 | Temp 98.3°F | Resp 16

## 2016-01-05 DIAGNOSIS — Z794 Long term (current) use of insulin: Secondary | ICD-10-CM

## 2016-01-05 DIAGNOSIS — I69359 Hemiplegia and hemiparesis following cerebral infarction affecting unspecified side: Secondary | ICD-10-CM | POA: Diagnosis not present

## 2016-01-05 DIAGNOSIS — E118 Type 2 diabetes mellitus with unspecified complications: Secondary | ICD-10-CM

## 2016-01-05 DIAGNOSIS — D649 Anemia, unspecified: Secondary | ICD-10-CM

## 2016-01-05 DIAGNOSIS — M549 Dorsalgia, unspecified: Secondary | ICD-10-CM | POA: Diagnosis not present

## 2016-01-05 DIAGNOSIS — G8929 Other chronic pain: Secondary | ICD-10-CM | POA: Diagnosis not present

## 2016-01-05 DIAGNOSIS — R6510 Systemic inflammatory response syndrome (SIRS) of non-infectious origin without acute organ dysfunction: Secondary | ICD-10-CM | POA: Diagnosis not present

## 2016-01-05 DIAGNOSIS — J690 Pneumonitis due to inhalation of food and vomit: Secondary | ICD-10-CM | POA: Diagnosis not present

## 2016-01-05 NOTE — Telephone Encounter (Signed)
Susan House with [facility] would like a call back. She would like to know what dosage of insulin pt is supposed to be taking. Please advise. Thanks TNP
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
## 2016-01-05 NOTE — Telephone Encounter (Signed)
10 or 20 or 30 units twice daily as needed, depending on her sugars, like sliding scale.-aa

## 2016-01-05 NOTE — Telephone Encounter (Signed)
Lisa informed

## 2016-01-06 ENCOUNTER — Ambulatory Visit (INDEPENDENT_AMBULATORY_CARE_PROVIDER_SITE_OTHER): Payer: Commercial Managed Care - HMO | Admitting: Neurology

## 2016-01-06 ENCOUNTER — Encounter: Payer: Self-pay | Admitting: Neurology

## 2016-01-06 VITALS — BP 150/72 | HR 88 | Resp 20 | Ht 61.0 in | Wt 273.0 lb

## 2016-01-06 DIAGNOSIS — T670XXA Heatstroke and sunstroke, initial encounter: Secondary | ICD-10-CM

## 2016-01-06 DIAGNOSIS — T6701XA Heatstroke and sunstroke, initial encounter: Secondary | ICD-10-CM | POA: Insufficient documentation

## 2016-01-06 LAB — CBC WITH DIFFERENTIAL/PLATELET
BASOS ABS: 0 10*3/uL (ref 0.0–0.2)
Basos: 0 %
EOS (ABSOLUTE): 0.3 10*3/uL (ref 0.0–0.4)
EOS: 3 %
HEMATOCRIT: 35.1 % (ref 34.0–46.6)
Hemoglobin: 11.2 g/dL (ref 11.1–15.9)
Immature Grans (Abs): 0 10*3/uL (ref 0.0–0.1)
Immature Granulocytes: 0 %
LYMPHS ABS: 1.5 10*3/uL (ref 0.7–3.1)
Lymphs: 18 %
MCH: 27.5 pg (ref 26.6–33.0)
MCHC: 31.9 g/dL (ref 31.5–35.7)
MCV: 86 fL (ref 79–97)
MONOS ABS: 0.7 10*3/uL (ref 0.1–0.9)
Monocytes: 8 %
Neutrophils Absolute: 5.9 10*3/uL (ref 1.4–7.0)
Neutrophils: 71 %
Platelets: 264 10*3/uL (ref 150–379)
RBC: 4.08 x10E6/uL (ref 3.77–5.28)
RDW: 15.7 % — AB (ref 12.3–15.4)
WBC: 8.4 10*3/uL (ref 3.4–10.8)

## 2016-01-06 LAB — HEMOGLOBIN A1C
ESTIMATED AVERAGE GLUCOSE: 189 mg/dL
Hgb A1c MFr Bld: 8.2 % — ABNORMAL HIGH (ref 4.8–5.6)

## 2016-01-06 NOTE — Patient Instructions (Signed)
Confusion Confusion is the inability to think with your usual speed or clarity. Confusion may come on quickly or slowly over time. How quickly the confusion comes on depends on the cause. Confusion can be due to any number of causes. CAUSES   Concussion, head injury, or head trauma.  Seizures.  Stroke.  Fever.  Brain tumor.  Age related decreased brain function (dementia).  Heightened emotional states like rage or terror.  Mental illness in which the person loses the ability to determine what is real and what is not (hallucinations).  Infections such as a urinary tract infection (UTI).  Toxic effects from alcohol, drugs, or prescription medicines.  Dehydration and an imbalance of salts in the body (electrolytes).  Lack of sleep.  Low blood sugar (diabetes).  Low levels of oxygen from conditions such as chronic lung disorders.  Drug interactions or other medicine side effects.  Nutritional deficiencies, especially niacin, thiamine, vitamin C, or vitamin B.  Sudden drop in body temperature (hypothermia).  Change in routine, such as when traveling or hospitalized. SIGNS AND SYMPTOMS  People often describe their thinking as cloudy or unclear when they are confused. Confusion can also include feeling disoriented. That means you are unaware of where or who you are. You may also not know what the date or time is. If confused, you may also have difficulty paying attention, remembering, and making decisions. Some people also act aggressively when they are confused.  DIAGNOSIS  The medical evaluation of confusion may include:  Blood and urine tests.  X-rays.  Brain and nervous system tests.  Analyzing your brain waves (electroencephalogram or EEG).  Magnetic resonance imaging (MRI) of your head.  Computed tomography (CT) scan of your head.  Mental status tests in which your health care provider may ask many questions. Some of these questions may seem silly or strange,  but they are a very important test to help diagnose and treat confusion. TREATMENT  An admission to the hospital may not be needed, but a person with confusion should not be left alone. Stay with a family member or friend until the confusion clears. Avoid alcohol, pain relievers, or sedative drugs until you have fully recovered. Do not drive until directed by your health care provider. HOME CARE INSTRUCTIONS  What family and friends can do:  To find out if someone is confused, ask the person to state his or her name, age, and the date. If the person is unsure or answers incorrectly, he or she is confused.  Always introduce yourself, no matter how well the person knows you.  Often remind the person of his or her location.  Place a calendar and clock near the confused person.  Help the person with his or her medicines. You may want to use a pill box, an alarm as a reminder, or give the person each dose as prescribed.  Talk about current events and plans for the day.  Try to keep the environment calm, quiet, and peaceful.  Make sure the person keeps follow-up visits with his or her health care provider. PREVENTION  Ways to prevent confusion:  Avoid alcohol.  Eat a balanced diet.  Get enough sleep.  Take medicine only as directed by your health care provider.  Do not become isolated. Spend time with other people and make plans for your days.  Keep careful watch on your blood sugar levels if you are diabetic. SEEK IMMEDIATE MEDICAL CARE IF:   You develop severe headaches, repeated vomiting, seizures, blackouts, or   slurred speech.  There is increasing confusion, weakness, numbness, restlessness, or personality changes.  You develop a loss of balance, have marked dizziness, feel uncoordinated, or fall.  You have delusions, hallucinations, or develop severe anxiety.  Your family members think you need to be rechecked.   This information is not intended to replace advice given  to you by your health care provider. Make sure you discuss any questions you have with your health care provider.   Document Released: 08/10/2004 Document Revised: 07/24/2014 Document Reviewed: 08/08/2013 Elsevier Interactive Patient Education 2016 [employer] Inc.  

## 2016-01-06 NOTE — Progress Notes (Signed)
[facility]  PATIENT: Nellie Pester DOB: May 05, 1956   REASON FOR VISIT: Followup for stroke and restless leg syndrome   HISTORY OF PRESENT ILLNESS:  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 She has continued to take Plavix and aspirin and her risk factors have not changed significantly since our last visit. She has a history of his hypertension, migraine headaches, gastroesophageal reflux, diabetes, hyperlipidemia, obesity, irritable bowel syndrome, urinary incontinence, and was last admitted with a UTI to [facility] in [place]. Admission date was 12/26/2015 under the guidance of Dr. Fulton Reek. Primary doctor is Miguel Aschoff Junior discharge date was 12/28/2015. The patient was diagnosed with altered mental status due to urosepsis. She was treated with 3 antibiotics. Upon discharge the patient was told that she did not have an infection? She was diagnosed with systemic inflammatory response syndrome and encephalopathy. A CT of the head was obtained on the 11th of children it showed no acute intracranial abnormalities, an MRI of the brain with and without contrast followed on 12/28/2015."  It appeared stable in comparison to a brain image from 2015 with chronic  deep left intracranial scoliosis, postoperative changes to both globes after cataract surgery, some minor nasal sinus mucosal thickening. 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 She was at least 8 hours out on a very hot and sunny day working outside not sure that she hydrated properly developing a terrific headache she cannot remember  what happened that night from Saturday to Sunday and her husband reports that she was not able to sleep for a couple of nights. He accompanied her to the hospital. It is likely that she had a heat related accident. She missed a couple of medicine doses as well.   CM-  09-2014 [name], 60 year old female returns for followup. She has history of stroke in 2000 and then a posterior circulatory TIA in June of 2010. She is currently on Plavix and aspirin. She denies further stroke or TIA symptoms. She complains of chronic dizziness. She has had admission to the hospital in the past or dizziness which was due to dehydration. Her restless leg syndrome symptoms are well controlled with Mirapex. She denies any compulsive behaviors. She states her blood sugars have been between 130 and 150 fasting. She is on insulin. She returns for reevaluation She has history of stroke in July, 2000, with residual left hemiparesis and left homonymous hemaniopsia. She experienced a posterior circulation TIA in June, 2010. In October, 2010, she developed horizontal diplopia and was seen at [facility] ER. An MRI was performed that was unchanged from prior studies. 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 The diplopia resolved after a few months and she has had no further problems with her vision. She does report some intermittent dizziness with upgaze.  She complains of tinnitus and wonders if it is related to her other medical conditions. She says that it is not present constantly and does not have a pulsatile quality. She finds it annoying but it is not otherwise bothersome to her. Patient also has a  history of restless leg syndrome for which she takes Mirapex. She denies any compulsive behaviors. She claims she had an ER admission in March for dizziness which she was told was due to dehydration. She received IV fluids. She is  wearing a Unna boot to the left lower leg, she has vascular insufficiency but claims that she has no skin breakdown. She reports that her CBGs have been within the normal range. She has no new complaints today   REVIEW OF SYSTEMS: Full 14 system review of systems performed and notable only for those listed, all others are neg:  Respiratory: Cough , mucous discharge Gastroitestinal: Frequency of urine  Neurological: Dizziness, hemiparesis,   Psychiatric: confused on a hot day.   ALLERGIES: Allergies  Allergen Reactions  . Morphine And Related Anaphylaxis  . Fentanyl Other (See Comments)    Unknown reaction.  . Reglan [Metoclopramide]   . Simvastatin Other (See Comments)    Muscle pain  . Betadine [Povidone Iodine] Rash  . Iodine Rash  . Tetracyclines & Related Rash    HOME MEDICATIONS: Outpatient Prescriptions Prior to Visit  Medication Sig Dispense Refill  . amLODipine (NORVASC) 10 MG tablet Take 1 tablet (10 mg total) by mouth daily. 30 tablet 0  . aspirin 325 MG tablet Take 325 mg by mouth daily.    Marland Kitchen atorvastatin (LIPITOR) 20 MG tablet Take 1 tablet (20 mg total) by mouth at bedtime. 90 tablet 3  . baclofen (LIORESAL) 20 MG tablet Take 20 mg by mouth 2 (two) times daily.    . Blood Glucose Monitoring Suppl (ACCU-CHEK AVIVA PLUS) w/Device KIT Check sugar three times daily DX E11.9-needs a meter 1 kit 0  . clopidogrel (PLAVIX) 75 MG tablet TAKE 1 TABLET EVERY DAY 90 tablet 3  . Cranberry 1000 MG CAPS Take 2,000 mg by mouth daily.     . enalapril (VASOTEC) 20 MG tablet Take 20 mg by mouth 2 (two) times daily.    Marland Kitchen gabapentin (NEURONTIN) 100 MG capsule Take 1 capsule (100 mg total) by mouth 3 (three) times daily. (Patient taking differently: Take 100 mg by mouth 2 (two) times daily. ) 90 capsule 3  . glucose blood (ACCU-CHEK AVIVA) test strip Check sugar three times daily DX E11.9 100 each 3  . hydrochlorothiazide (HYDRODIURIL) 25 MG tablet TAKE 1 TABLET EVERY DAY 90 tablet 3  .  HYDROcodone-acetaminophen (NORCO) 10-325 MG tablet Take 1-2 tablets by mouth every 4 (four) hours as needed. 180 tablet 0  . insulin aspart protamine- aspart (NOVOLOG MIX 70/30) (70-30) 100 UNIT/ML injection Inject 25-30 Units into the skin 2 (two) times daily.     Elmore Guise Devices (ACCU-CHEK SOFTCLIX) lancets Check sugar three times daily DX E11.9 100 each 3  . magnesium oxide (MAG-OX) 400 (241.3 Mg) MG tablet Take 1 tablet (400 mg total) by mouth daily. 90 tablet 3  . metFORMIN (GLUCOPHAGE) 1000 MG tablet TAKE 1 TABLET TWICE DAILY 180 tablet 3  . nitroGLYCERIN (NITROSTAT) 0.4 MG SL tablet Place 1 tablet (0.4 mg total) under the tongue every 5 (five) minutes as needed for chest pain. 25 tablet 0  . oxybutynin (DITROPAN-XL) 5 MG 24 hr tablet Take 1 tablet (5 mg total) by mouth daily. 90 tablet 3  . pantoprazole (PROTONIX) 40 MG tablet Take 1 tablet (40 mg total) by mouth daily. 90 tablet 3  . triamcinolone cream (KENALOG) 0.1 % Apply 1 application topically 2 (two) times daily. 30 g 0   No facility-administered medications prior to  visit.    PAST MEDICAL HISTORY: Past Medical History  Diagnosis Date  . Cellulitis and abscess of face   . Allergy   . Depression   . Hypertension   . Edema   . Arthritis   . Migraine   . GERD (gastroesophageal reflux disease)   . Stroke ([place]) 2000  . Diabetes mellitus ([place])   . Hyperlipidemia   . Morbid obesity ([place])   . IBS (irritable bowel syndrome)   . Urinary incontinence   . History of nuclear stress test     a. 12/2009: lexiscan - negative  . TIA (transient ischemic attack) 2010  . History of echocardiogram     2008: normal, 2011: normal LVSF  . Hematuria   . Nocturia   . Urgency of micturation   . Urinary frequency     PAST SURGICAL HISTORY: Past Surgical History  Procedure Laterality Date  . Colonoscopy  2010  . Appendectomy    . Hernia repair    . Eye surgery    . Upper gi endoscopy    . Carpel tunnel surgery    . Back surgery        extensive spinal fusion  . Shoulder surgery    . Wrist fracture surgery Left   . Abdominoplasty    . Toe surgery    . Abdominal hysterectomy    . Cataract extraction      x 2  . Cholecystectomy    . Cesarean section      FAMILY HISTORY: Family History  Problem Relation Age of Onset  . Hyperlipidemia Mother   . Arrhythmia Mother     WPW  . Heart attack Father 67  . Hyperlipidemia Father   . Hypertension Father   . Hypertension Mother   . Rheum arthritis Mother   . Heart disease Mother   . Stroke Father   . Diabetes Father   . Heart disease Father   . Coronary artery disease Father   . Diabetes Sister   . Hyperlipidemia Sister   . Hypertension Sister   . Depression Sister   . Depression Sister   . Diabetes Sister   . Hypertension Sister   . Kidney disease Paternal Grandmother     SOCIAL HISTORY: Social History   Social History  . Marital Status: Married    Spouse Name: Marcello Moores   . Number of Children: 2  . Years of Education: 15   Occupational History  . Disabled    . retired    Social History Main Topics  . Smoking status: Never Smoker   . Smokeless tobacco: Never Used  . Alcohol Use: No  . Drug Use: No  . Sexual Activity: No   Other Topics Concern  . Not on file   Social History Narrative   ** Merged History Encounter **       Patient lives at home with husband Marcello Moores.    Patient has 2 children and 2 step children.    Patient has 12+ years of education.    Patient is Disabled.      PHYSICAL EXAM  Filed Vitals:   01/06/16 1516  BP: 150/72  Pulse: 88  Resp: 20  Height: _0  (1.549 m)  Weight: 273 lb (123.832 kg)   Body mass index is 51.61 kg/(m^2).  Generalized: Well developed, morbidly obese female in no acute distress  Head: normocephalic and atraumatic,. Oral cavity dry and tongue is cracked.   Neck: Supple, no carotid bruits  Cardiac: Regular  rate rhythm, no murmur  Musculoskeletal: Claw like deformity of the left hand   Neurological examination   Mentation: Alert oriented to time, place, history taking. Follows all commands speech and language fluent- she is amnestic for 2 days prior to her admission to hospital.  Cranial nerve : Normal sense of taste and smell Fundoscopic exam reveals sharp disc margins.Pupils were equal round reactive to light extraocular movements with left homonympous hemianopsia,  visual field were full on confrontational test. Facial sensation and strength were normal. hearing was intact to finger rubbing bilaterally. Uvula tongue midline. head turning and shoulder shrug were normal and symmetric.Tongue protrusion into cheek strength was normal. Motor: normal bulk and tone, full strength on the right side, spastic hemiparesis with left lower extremity 4/5 left foot drop. The left upper arm has 4/5 strength of the arm and  2/5 strength with a clawlike deformity and increased tone of the hand  Sensory: normal and symmetric to light touch, pinprick, and  vibration on the right, diminished to touch and temperature on the left, pinprick and vibratory are normal  Coordination: finger-nose-finger, heel-to-shin bilaterally, normal on the right significantly diminished on the left  Reflexes: 2+ and symmetrical on the left ,  1+ on the right, toes downgoing on the right upgoing on the left. Gait and Station: Rising up from seated position without assistance, using a rolling walker,  unsteady gait, difficulty with turning obvious foot drop on the left  DIAGNOSTIC DATA (LABS, IMAGING, TESTING) See ED notes from [place]  60 y.o. year old female  has a past medical history of stroke in 2000 with residual left hemiparesis and left homonymous hemianopsia.  Posterior circulatory TIA in June 2010, restless leg syndrome continued dizziness probably due to dehydration  Continue Plavix and aspirin for secondary stroke prevention Continue Mirapex for restless leg syndrome will refill  with mail order- she endorsed the [employer] questionnaire at low impairment  Given information on dehydration, she needs to increase her fluids Followup in 6 months next visit with Dennie Bible, Charlotte Crumb, [facility] [address], [place],  [address] [phone]

## 2016-01-13 ENCOUNTER — Ambulatory Visit (INDEPENDENT_AMBULATORY_CARE_PROVIDER_SITE_OTHER): Payer: Commercial Managed Care - HMO | Admitting: Family Medicine

## 2016-01-13 ENCOUNTER — Ambulatory Visit (INDEPENDENT_AMBULATORY_CARE_PROVIDER_SITE_OTHER): Payer: Commercial Managed Care - HMO

## 2016-01-13 VITALS — BP 146/60 | HR 76 | Temp 97.7°F | Resp 16

## 2016-01-13 DIAGNOSIS — N390 Urinary tract infection, site not specified: Secondary | ICD-10-CM

## 2016-01-13 LAB — MICROSCOPIC EXAMINATION: WBC, UA: >30 /hpf — AB (ref 0–?)

## 2016-01-13 LAB — URINALYSIS, COMPLETE
Bilirubin, UA: NEGATIVE
Glucose, UA: NEGATIVE
NITRITE UA: POSITIVE — AB
Specific Gravity, UA: 1.025 (ref 1.005–1.030)
Urobilinogen, Ur: 0.2 mg/dL (ref 0.2–1.0)
pH, UA: 5 (ref 5.0–7.5)

## 2016-01-13 MED ORDER — SULFAMETHOXAZOLE-TRIMETHOPRIM 800-160 MG PO TABS: 1.0000 | ORAL_TABLET | Freq: Two times a day (BID) | ORAL | Status: DC

## 2016-01-13 NOTE — Progress Notes (Signed)
In and Out Catheterization  Patient is present today for a I & O catheterization due to possible UTI. Patient was cleaned and prepped in a sterile fashion with betadine and Lidocaine 2% jelly was instilled into the urethra.  A 14FR cath was inserted no complications were noted , 127ml of urine return was noted, urine was amber in color. A clean urine sample was collected for u/a and cx. Bladder was drained  And catheter was removed with out difficulty.    Preformed by: Toniann Fail, LPN   Follow up/ Additional notes: This is a Dr. Rosanna Randy pt who requested pt have cath specimen. Results will be given to Dr. Rosanna Randy.
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
## 2016-01-13 NOTE — Patient Instructions (Signed)
Patient is instructed to go to the urologist office today after she leaves here as they are going to get a cath specimen.  Patient is given a prescription for an antibiotic to use while culture is pending.

## 2016-01-14 DIAGNOSIS — E119 Type 2 diabetes mellitus without complications: Secondary | ICD-10-CM

## 2016-01-14 DIAGNOSIS — G9341 Metabolic encephalopathy: Secondary | ICD-10-CM

## 2016-01-14 DIAGNOSIS — I69354 Hemiplegia and hemiparesis following cerebral infarction affecting left non-dominant side: Secondary | ICD-10-CM

## 2016-01-14 DIAGNOSIS — Z87440 Personal history of urinary (tract) infections: Secondary | ICD-10-CM
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
## 2016-01-16 LAB — CULTURE, URINE COMPREHENSIVE

## 2016-01-17 ENCOUNTER — Telehealth: Payer: Self-pay | Admitting: Urology

## 2016-01-17 ENCOUNTER — Other Ambulatory Visit: Payer: Self-pay | Admitting: Family Medicine

## 2016-01-17 NOTE — Telephone Encounter (Signed)
Patient called the office today to obtain her urine culture results.  I advised her "Patient's urine culture is positive. It is sensitive to the Septra DS that Dr. Rosanna Randy prescribed, so she should continue that medication. Dr. Rosanna Randy would like a recheck of her urine after she completes her antibiotic. She will need to present to the office for a catheter UA."  Dr. Rosanna Randy only prescribed her 3 days worth of abx.  She is wanting to know if this is adequate.  She also stated that she is feeling dehydrated.

## 2016-01-17 NOTE — Telephone Encounter (Signed)
She needs to increase her fluid intake.  She should come in this week for a CATH UA and culture.

## 2016-01-17 NOTE — Telephone Encounter (Signed)
-----   Message from Nori Riis, PA-C sent at 01/16/2016  6:01 PM EDT ----- Patient's urine culture is positive. It is sensitive to the Septra DS that Dr. Rosanna Randy prescribed, so she should continue that medication.  Dr. Rosanna Randy would like a recheck of her urine after she completes her antibiotic. She will need to present to the office for a catheter UA.

## 2016-01-19 ENCOUNTER — Ambulatory Visit (INDEPENDENT_AMBULATORY_CARE_PROVIDER_SITE_OTHER): Payer: Commercial Managed Care - HMO | Admitting: Family Medicine

## 2016-01-19 ENCOUNTER — Encounter: Payer: Self-pay | Admitting: Family Medicine

## 2016-01-19 VITALS — BP 142/60 | HR 88 | Temp 98.1°F | Resp 16 | Wt 274.0 lb

## 2016-01-19 DIAGNOSIS — R51 Headache: Secondary | ICD-10-CM | POA: Diagnosis not present

## 2016-01-19 DIAGNOSIS — R519 Headache, unspecified: Secondary | ICD-10-CM

## 2016-01-19 DIAGNOSIS — N3001 Acute cystitis with hematuria: Secondary | ICD-10-CM

## 2016-01-19 DIAGNOSIS — E1122 Type 2 diabetes mellitus with diabetic chronic kidney disease: Secondary | ICD-10-CM | POA: Diagnosis not present

## 2016-01-19 DIAGNOSIS — N183 Chronic kidney disease, stage 3 (moderate): Secondary | ICD-10-CM

## 2016-01-19 NOTE — Progress Notes (Signed)
Patient: Susan House Female    DOB: Sep 23, 1955   60 y.o.   MRN: [id number] Visit Date: 01/19/2016  Today's Provider: Wilhemena Durie, MD   Chief Complaint  Patient presents with  . Headache   Subjective:    Headache  This is a new problem. The current episode started in the past 7 days. The problem occurs constantly. The problem has been unchanged. The pain does not radiate. The quality of the pain is described as dull and throbbing. Associated symptoms include dizziness. Pertinent negatives include no blurred vision, neck pain or sinus pressure. Nothing aggravates the symptoms. She has tried acetaminophen for the symptoms. The treatment provided mild relief.  Patient feels like she could be dehydrated. She reports that she has been trying to push fluids, but it does not seem to be helping. Patient reports that she has felt bad for the last 5 days.     Allergies  Allergen Reactions  . Morphine And Related Anaphylaxis  . Fentanyl Other (See Comments)    Unknown reaction.  . Reglan [Metoclopramide]   . Simvastatin Other (See Comments)    Muscle pain  . Betadine [Povidone Iodine] Rash  . Iodine Rash  . Tetracyclines & Related Rash   Current Meds  Medication Sig  . amLODipine (NORVASC) 10 MG tablet Take 1 tablet (10 mg total) by mouth daily.  Marland Kitchen aspirin 325 MG tablet Take 325 mg by mouth daily.  Marland Kitchen atorvastatin (LIPITOR) 20 MG tablet Take 1 tablet (20 mg total) by mouth at bedtime.  . baclofen (LIORESAL) 20 MG tablet Take 20 mg by mouth 2 (two) times daily.  . Blood Glucose Monitoring Suppl (ACCU-CHEK AVIVA PLUS) w/Device KIT Check sugar three times daily DX E11.9-needs a meter  . clopidogrel (PLAVIX) 75 MG tablet TAKE 1 TABLET EVERY DAY  . Cranberry 1000 MG CAPS Take 2,000 mg by mouth daily.   . enalapril (VASOTEC) 20 MG tablet TAKE 1 TABLET TWICE DAILY  . gabapentin (NEURONTIN) 100 MG capsule Take 1 capsule (100 mg total) by mouth 3 (three) times daily. (Patient  taking differently: Take 100 mg by mouth 2 (two) times daily. )  . glucose blood (ACCU-CHEK AVIVA) test strip Check sugar three times daily DX E11.9  . hydrochlorothiazide (HYDRODIURIL) 25 MG tablet TAKE 1 TABLET EVERY DAY  . HYDROcodone-acetaminophen (NORCO) 10-325 MG tablet Take 1-2 tablets by mouth every 4 (four) hours as needed.  . insulin aspart protamine- aspart (NOVOLOG MIX 70/30) (70-30) 100 UNIT/ML injection Inject 25-30 Units into the skin 2 (two) times daily.   Elmore Guise Devices (ACCU-CHEK SOFTCLIX) lancets Check sugar three times daily DX E11.9  . magnesium oxide (MAG-OX) 400 (241.3 Mg) MG tablet Take 1 tablet (400 mg total) by mouth daily.  . metFORMIN (GLUCOPHAGE) 1000 MG tablet TAKE 1 TABLET TWICE DAILY  . nitroGLYCERIN (NITROSTAT) 0.4 MG SL tablet Place 1 tablet (0.4 mg total) under the tongue every 5 (five) minutes as needed for chest pain.  Marland Kitchen oxybutynin (DITROPAN-XL) 5 MG 24 hr tablet Take 1 tablet (5 mg total) by mouth daily.  . pantoprazole (PROTONIX) 40 MG tablet Take 1 tablet (40 mg total) by mouth daily.  Marland Kitchen sulfamethoxazole-trimethoprim (BACTRIM DS,SEPTRA DS) 800-160 MG tablet Take 1 tablet by mouth 2 (two) times daily.  Marland Kitchen triamcinolone cream (KENALOG) 0.1 % Apply 1 application topically 2 (two) times daily.    Review of Systems  Constitutional: Negative.   HENT: Negative for sinus pressure.  Eyes: Negative.  Negative for blurred vision.  Respiratory: Negative.   Cardiovascular: Negative.   Endocrine: Negative.   Musculoskeletal: Negative for neck pain.  Neurological: Positive for dizziness and headaches.  Hematological: Negative.   Psychiatric/Behavioral: Negative.     Social History  Substance Use Topics  . Smoking status: Never Smoker   . Smokeless tobacco: Never Used  . Alcohol Use: No   Objective:   BP 142/60 mmHg  Pulse 88  Temp(Src) 98.1 F (36.7 C)  Resp 16  Wt 274 lb (124.286 kg)  Physical Exam  Constitutional: She is oriented to person,  place, and time. She appears well-developed and well-nourished.  HENT:  Head: Normocephalic and atraumatic.  Right Ear: External ear normal.  Left Ear: External ear normal.  Nose: Nose normal.  Mouth/Throat: Oropharynx is clear and moist.  No tenderness of temporal arteries.  Eyes: Conjunctivae and EOM are normal. Pupils are equal, round, and reactive to light.  Neck: Normal range of motion. Neck supple.  No nuchal rigidity.   Cardiovascular: Normal rate, regular rhythm and normal heart sounds.   Pulmonary/Chest: Effort normal and breath sounds normal.  Abdominal: Soft.  Neurological: She is alert and oriented to person, place, and time.  Chronic  Mild left facial weakness. Chronic left hemiparesis with no sensory defect deficit on that side.  Skin: Skin is warm and dry. Rash noted.  Blister on anterior left foot. Top of first 2 toes.  Psychiatric: She has a normal mood and affect. Her behavior is normal. Judgment and thought content normal.        Assessment & Plan:     1. Headache, unspecified headache type Will continue to monitor. Will obtain CT scan and sed rate if not improving.   2. Acute cystitis with hematuria Stable. F/U per  Dr. Erlene Quan. Recent UTI is treated. Patient now asymptomatic. 3. Type 2 diabetes mellitus with stage 3 chronic kidney disease, without long-term current use of insulin (HCC) Stable. F/U in 3 months.        Richard Cranford Mon, MD  [facility]

## 2016-01-20 ENCOUNTER — Encounter: Payer: Self-pay | Admitting: Family Medicine

## 2016-01-20 ENCOUNTER — Ambulatory Visit (INDEPENDENT_AMBULATORY_CARE_PROVIDER_SITE_OTHER): Payer: Commercial Managed Care - HMO | Admitting: Family Medicine

## 2016-01-20 VITALS — BP 158/68 | HR 82 | Temp 97.8°F | Resp 18 | Wt 277.0 lb

## 2016-01-20 DIAGNOSIS — R600 Localized edema: Secondary | ICD-10-CM | POA: Diagnosis not present

## 2016-01-20 DIAGNOSIS — L03032 Cellulitis of left toe: Secondary | ICD-10-CM | POA: Diagnosis not present

## 2016-01-20 MED ORDER — AMOXICILLIN 500 MG PO CAPS: 500.0000 mg | ORAL_CAPSULE | Freq: Two times a day (BID) | ORAL | Status: DC
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
## 2016-01-20 NOTE — Patient Instructions (Signed)
Take lasix you have at home daily over the weekend (for the next 4 days)

## 2016-01-25 ENCOUNTER — Ambulatory Visit: Payer: Self-pay | Admitting: Family Medicine

## 2016-01-25 ENCOUNTER — Telehealth: Payer: Self-pay | Admitting: Emergency Medicine

## 2016-01-25 ENCOUNTER — Telehealth: Payer: Self-pay | Admitting: Family Medicine

## 2016-01-25 NOTE — Telephone Encounter (Signed)
Merry Proud with [facility] calling for verbal orders for PT 1 time a week for 2 more weeks to continue with strengthen and improved mobility in her home.    CB# [phone]

## 2016-01-25 NOTE — Telephone Encounter (Signed)
Lattie Haw with [facility] calling with a question about pt's medication for toe and if it should be left open with out a bandage. Please return Lattie Haw call @ [phone]. Thanks CC

## 2016-01-25 NOTE — Telephone Encounter (Signed)
Spoke with Susan House and patient is going to be putting neosporin and bandage on the toes, the area is a little red and irritated. Susan House patient can be seen if this does not improve sooner then next week-aa

## 2016-01-26 NOTE — Telephone Encounter (Signed)
Spoke with patient. Patient states no sxs of uti at this time and she had more antibiotic at home and took three more days worth. She has been off antibiotic for a few days. Spoke to patient about increasing her fluid intake so she would not feel dehydrated. Also let her know that Dr. Rosanna Randy and Larene Beach wants her to recheck on Nurse schedule for a cath specimen and culture to make sure infection is gone. Patient ok with plan and appointment was made.

## 2016-01-27 ENCOUNTER — Ambulatory Visit (INDEPENDENT_AMBULATORY_CARE_PROVIDER_SITE_OTHER): Payer: Commercial Managed Care - HMO

## 2016-01-27 DIAGNOSIS — N390 Urinary tract infection, site not specified: Secondary | ICD-10-CM

## 2016-01-27 LAB — URINALYSIS, COMPLETE
Bilirubin, UA: NEGATIVE
GLUCOSE, UA: NEGATIVE
KETONES UA: NEGATIVE
Leukocytes, UA: NEGATIVE
NITRITE UA: NEGATIVE
RBC UA: NEGATIVE
SPEC GRAV UA: 1.02 (ref 1.005–1.030)
UUROB: 0.2 mg/dL (ref 0.2–1.0)
pH, UA: 5 (ref 5.0–7.5)

## 2016-01-27 LAB — MICROSCOPIC EXAMINATION: Bacteria, UA: NONE SEEN

## 2016-01-27 NOTE — Telephone Encounter (Signed)
ok 

## 2016-01-27 NOTE — Telephone Encounter (Signed)
Susan House

## 2016-01-27 NOTE — Progress Notes (Signed)
In and Out Catheterization  Patient is present today for a I & O catheterization due to recurrent UTI. Patient was cleaned and prepped in a sterile fashion with betadine and Lidocaine 2% jelly was instilled into the urethra.  A 14FR cath was inserted no complications were noted , 170ml of urine return was noted, urine was yellow in color. A clean urine sample was collected for u/a and cx. Bladder was drained  And catheter was removed with out difficulty.    Preformed by: Toniann Fail, LPN

## 2016-01-28 ENCOUNTER — Encounter (HOSPITAL_COMMUNITY): Payer: Self-pay | Admitting: Emergency Medicine

## 2016-01-28 ENCOUNTER — Ambulatory Visit (HOSPITAL_COMMUNITY)
Admission: EM | Admit: 2016-01-28 | Discharge: 2016-01-28 | Disposition: A | Discharge status: Home / Self Care | Payer: Commercial Managed Care - HMO | Attending: Internal Medicine | Admitting: Internal Medicine

## 2016-01-28 DIAGNOSIS — L03116 Cellulitis of left lower limb: Secondary | ICD-10-CM

## 2016-01-28 MED ORDER — CEFTRIAXONE SODIUM 1 G IJ SOLR
INTRAMUSCULAR | Status: AC
  Filled 2016-01-28: qty 10

## 2016-01-28 MED ORDER — LIDOCAINE HCL (PF) 1 % IJ SOLN
INTRAMUSCULAR | Status: AC
Start: 2016-01-28 — End: 2016-01-28
  Filled 2016-01-28: qty 2

## 2016-01-28 MED ORDER — CEFTRIAXONE SODIUM 1 G IJ SOLR
1.0000 g | Freq: Once | INTRAMUSCULAR | Status: AC
  Administered 2016-01-28: 1 g via INTRAMUSCULAR

## 2016-01-28 NOTE — ED Notes (Signed)
Pt is here for wounds on left 4th and 5th digit of foot onset x1 week... Reports drainage... Hx of DM Seen by podiatrist yest but it was not draining denies pain... Brought back in wheel chair... A&O x4... NAD

## 2016-01-28 NOTE — Discharge Instructions (Signed)

## 2016-01-28 NOTE — ED Provider Notes (Signed)
CSN: [id number]     Arrival date & time 01/28/16  1457 History   First MD Initiated Contact with Patient 01/28/16 1632     Chief Complaint  Patient presents with  . Wound Infection   (Consider location/radiation/quality/duration/timing/severity/associated sxs/prior Treatment) Patient is a 59 y.o. female presenting with foot injury. The history is provided by the patient. No language interpreter was used.  Foot Injury Location:  Foot Time since incident:  1 week Injury: no   Foot location:  L foot Pain details:    Quality:  Aching   Radiates to:  Does not radiate   Severity:  Mild   Onset quality:  Gradual Chronicity:  New Dislocation: no   Tetanus status:  Up to date Relieved by:  Nothing Worsened by:  Nothing tried Ineffective treatments:  None tried Associated symptoms: swelling   Pt has sores on 4th and 5th toe.  Her MD has been treating her with amoxicillian.  Podiatrist called in Bactrim today.  Pt reports redness around wound now.  Past Medical History  Diagnosis Date  . Cellulitis and abscess of face   . Allergy   . Depression   . Hypertension   . Edema   . Arthritis   . Migraine   . GERD (gastroesophageal reflux disease)   . Stroke ([facility]) 2000  . Diabetes mellitus ([facility])   . Hyperlipidemia   . Morbid obesity ([facility])   . IBS (irritable bowel syndrome)   . Urinary incontinence   . History of nuclear stress test     a. 12/2009: lexiscan - negative  . TIA (transient ischemic attack) 2010  . History of echocardiogram     2008: normal, 2011: normal LVSF  . Hematuria   . Nocturia   . Urgency of micturation   . Urinary frequency    Past Surgical History  Procedure Laterality Date  . Colonoscopy  2010  . Appendectomy    . Hernia repair    . Eye surgery    . Upper gi endoscopy    . Carpel tunnel surgery    . Back surgery      extensive spinal fusion  . Shoulder surgery    . Wrist fracture surgery Left   . Abdominoplasty    . Toe surgery    . Abdominal  hysterectomy    . Cataract extraction      x 2  . Cholecystectomy    . Cesarean section     Family History  Problem Relation Age of Onset  . Hyperlipidemia Mother   . Arrhythmia Mother     WPW  . Heart attack Father 72  . Hyperlipidemia Father   . Hypertension Father   . Hypertension Mother   . Rheum arthritis Mother   . Heart disease Mother   . Stroke Father   . Diabetes Father   . Heart disease Father   . Coronary artery disease Father   . Diabetes Sister   . Hyperlipidemia Sister   . Hypertension Sister   . Depression Sister   . Depression Sister   . Diabetes Sister   . Hypertension Sister   . Kidney disease Paternal Grandmother    Social History  Substance Use Topics  . Smoking status: Never Smoker   . Smokeless tobacco: Never Used  . Alcohol Use: No   OB History    Gravida Para Term Preterm AB TAB SAB Ectopic Multiple Living   1 1 0 0 0 0 0 0         Review of Systems  Skin: Positive for wound.  All other systems reviewed and are negative.   Allergies  Morphine and related; Fentanyl; Reglan; Simvastatin; Betadine; Iodine; and Tetracyclines & related  Home Medications   Prior to Admission medications   Medication Sig Start Date End Date Taking? Authorizing Provider  amLODipine (NORVASC) 10 MG tablet Take 1 tablet (10 mg total) by mouth daily. 12/14/15  Yes Richard L Gilbert Jr., MD  aspirin 325 MG tablet Take 325 mg by mouth daily.   Yes Historical Provider, MD  clopidogrel (PLAVIX) 75 MG tablet TAKE 1 TABLET EVERY DAY 12/20/15  Yes Richard L Gilbert Jr., MD  hydrochlorothiazide (HYDRODIURIL) 25 MG tablet TAKE 1 TABLET EVERY DAY 09/06/15  Yes Richard L Gilbert Jr., MD  insulin aspart protamine- aspart (NOVOLOG MIX 70/30) (70-30) 100 UNIT/ML injection Inject 25-30 Units into the skin 2 (two) times daily.    Yes Historical Provider, MD  metFORMIN (GLUCOPHAGE) 1000 MG tablet TAKE 1 TABLET TWICE DAILY 10/18/15  Yes Richard L Gilbert Jr., MD  oxybutynin (DITROPAN-XL)  5 MG 24 hr tablet Take 1 tablet (5 mg total) by mouth daily. 08/02/15  Yes Richard L Gilbert Jr., MD  pantoprazole (PROTONIX) 40 MG tablet Take 1 tablet (40 mg total) by mouth daily. 07/29/15  Yes Richard L Gilbert Jr., MD  amoxicillin (AMOXIL) 500 MG capsule Take 1 capsule (500 mg total) by mouth 2 (two) times daily. 01/20/16   Richard L Gilbert Jr., MD  atorvastatin (LIPITOR) 20 MG tablet Take 1 tablet (20 mg total) by mouth at bedtime. 09/13/15   Richard L Gilbert Jr., MD  baclofen (LIORESAL) 20 MG tablet Take 20 mg by mouth 2 (two) times daily.    Historical Provider, MD  Blood Glucose Monitoring Suppl (ACCU-CHEK AVIVA PLUS) w/Device KIT Check sugar three times daily DX E11.9-needs a meter 11/23/15   Richard L Gilbert Jr., MD  Cranberry 1000 MG CAPS Take 2,000 mg by mouth daily.     Historical Provider, MD  enalapril (VASOTEC) 20 MG tablet TAKE 1 TABLET TWICE DAILY 01/18/16   Richard L Gilbert Jr., MD  gabapentin (NEURONTIN) 100 MG capsule Take 1 capsule (100 mg total) by mouth 3 (three) times daily. Patient taking differently: Take 100 mg by mouth 2 (two) times daily.  09/13/15   Richard L Gilbert Jr., MD  glucose blood (ACCU-CHEK AVIVA) test strip Check sugar three times daily DX E11.9 11/23/15   Richard L Gilbert Jr., MD  HYDROcodone-acetaminophen ([place]CO) 10-325 MG tablet Take 1-2 tablets by mouth every 4 (four) hours as needed. 11/01/15   Richard L Gilbert Jr., MD  Lancet Devices (ACCU-CHEK SOFTCLIX) lancets Check sugar three times daily DX E11.9 11/23/15   Richard L Gilbert Jr., MD  magnesium oxide (MAG-OX) 400 (241.3 Mg) MG tablet Take 1 tablet (400 mg total) by mouth daily. 07/29/15   Richard L Gilbert Jr., MD  nitroGLYCERIN (NITROSTAT) 0.4 MG SL tablet Place 1 tablet (0.4 mg total) under the tongue every 5 (five) minutes as needed for chest pain. 10/11/15   Richard L Gilbert Jr., MD  sulfamethoxazole-trimethoprim (BACTRIM DS,SEPTRA DS) 800-160 MG tablet Take 1 tablet by mouth 2 (two) times daily. 01/13/16    Richard L Gilbert Jr., MD  triamcinolone cream (KENALOG) 0.1 % Apply 1 application topically 2 (two) times daily. 12/29/15   Richard L Gilbert Jr., MD   Meds Ordered and Administered this Visit   Medications  cefTRIAXone (ROCEPHIN) injection 1 g (1 g Intramuscular Given 01/28/16 1715)      BP 148/66 mmHg  Pulse 95  Temp(Src) 98.2 F (36.8 C) (Oral)  Resp 18  SpO2 99% No data found.   Physical Exam  Constitutional: She is oriented to person, place, and time. She appears well-developed and well-nourished.  Musculoskeletal: She exhibits tenderness.  2 small abrasions,  Redness around wound  2cm redness to foot  Neurological: She is alert and oriented to person, place, and time. She has normal reflexes.  Skin: There is erythema.  Psychiatric: She has a normal mood and affect.  Nursing note and vitals reviewed.   ED Course  Procedures (including critical care time)  Labs Review Labs Reviewed - No data to display  Imaging Review No results found.   Visual Acuity Review  Right Eye Distance:   Left Eye Distance:   Bilateral Distance:    Right Eye Near:   Left Eye Near:    Bilateral Near:         MDM  Pt has been on amoxicillian.  I doubt this would cover.   I advised continue/start Bactrim.  Pt given Rocephin 1 gram here.  Pt is to go to ED if worse over next 24,  Recheck here tomorrow for repeat injection of Rocephin if improving.    1. Cellulitis of left foot    An After Visit Summary was printed and given to the patient. Meds ordered this encounter  Medications  . cefTRIAXone (ROCEPHIN) injection 1 g    Sig:     Order Specific Question:  Antibiotic Indication:    Answer:  Cellulitis     Fransico Meadow, PA-C 01/28/16 2115

## 2016-01-29 LAB — CULTURE, URINE COMPREHENSIVE

## 2016-01-31 ENCOUNTER — Ambulatory Visit (INDEPENDENT_AMBULATORY_CARE_PROVIDER_SITE_OTHER): Payer: Commercial Managed Care - HMO | Admitting: Family Medicine

## 2016-01-31 ENCOUNTER — Encounter: Payer: Self-pay | Admitting: Family Medicine

## 2016-01-31 ENCOUNTER — Telehealth: Payer: Self-pay | Admitting: Family Medicine

## 2016-01-31 VITALS — BP 148/72 | HR 72 | Temp 97.8°F | Resp 20

## 2016-01-31 DIAGNOSIS — B9562 Methicillin resistant Staphylococcus aureus infection as the cause of diseases classified elsewhere: Secondary | ICD-10-CM

## 2016-01-31 DIAGNOSIS — G4700 Insomnia, unspecified: Secondary | ICD-10-CM | POA: Insufficient documentation

## 2016-01-31 DIAGNOSIS — L03116 Cellulitis of left lower limb: Secondary | ICD-10-CM

## 2016-01-31 DIAGNOSIS — R0683 Snoring: Secondary | ICD-10-CM | POA: Diagnosis not present

## 2016-01-31 MED ORDER — MELATONIN 10 MG PO TABS: 1.0000 | ORAL_TABLET | Freq: Every day | ORAL | Status: DC

## 2016-01-31 MED ORDER — ZOLPIDEM TARTRATE 5 MG PO TABS: 5.0000 mg | ORAL_TABLET | Freq: Every evening | ORAL | Status: DC | PRN

## 2016-01-31 NOTE — Progress Notes (Signed)
Patient: Susan House Female    DOB: 1956/07/05   59 y.o.   MRN: [id number] Visit Date: 01/31/2016  Today's Provider: Wilhemena Durie, MD   Chief Complaint  Patient presents with  . Insomnia   Subjective:    Insomnia Primary symptoms: fragmented sleep (as well as snoring), sleep disturbance, no difficulty falling asleep, somnolence, frequent awakening, no malaise/fatigue, napping.  The problem has been gradually worsening since onset. How many beverages per day that contain caffeine: 0 - 1.  Past treatments include nothing. Typical bedtime:  10-11 P.M..  Prior workup: Has had sleep study several years ago; reports the sleep study was negative at that time.       Allergies  Allergen Reactions  . Morphine And Related Anaphylaxis  . Fentanyl Other (See Comments)    Unknown reaction.  . Reglan [Metoclopramide]     Elevated BP  . Simvastatin Other (See Comments)    Muscle pain  . Betadine [Povidone Iodine] Rash  . Iodine Rash  . Tetracyclines & Related Rash   Current Meds  Medication Sig  . amLODipine (NORVASC) 10 MG tablet Take 1 tablet (10 mg total) by mouth daily.  Marland Kitchen aspirin 325 MG tablet Take 325 mg by mouth daily.  Marland Kitchen atorvastatin (LIPITOR) 20 MG tablet Take 1 tablet (20 mg total) by mouth at bedtime.  . Blood Glucose Monitoring Suppl (ACCU-CHEK AVIVA PLUS) w/Device KIT Check sugar three times daily DX E11.9-needs a meter  . clopidogrel (PLAVIX) 75 MG tablet TAKE 1 TABLET EVERY DAY  . Cranberry 1000 MG CAPS Take 2,000 mg by mouth daily.   . enalapril (VASOTEC) 20 MG tablet TAKE 1 TABLET TWICE DAILY  . glucose blood (ACCU-CHEK AVIVA) test strip Check sugar three times daily DX E11.9  . hydrochlorothiazide (HYDRODIURIL) 25 MG tablet TAKE 1 TABLET EVERY DAY  . HYDROcodone-acetaminophen (NORCO) 10-325 MG tablet Take 1-2 tablets by mouth every 4 (four) hours as needed.  . insulin aspart protamine- aspart (NOVOLOG MIX 70/30) (70-30) 100 UNIT/ML injection Inject  25-30 Units into the skin 2 (two) times daily.   Elmore Guise Devices (ACCU-CHEK SOFTCLIX) lancets Check sugar three times daily DX E11.9  . magnesium oxide (MAG-OX) 400 (241.3 Mg) MG tablet Take 1 tablet (400 mg total) by mouth daily.  . metFORMIN (GLUCOPHAGE) 1000 MG tablet TAKE 1 TABLET TWICE DAILY  . nitroGLYCERIN (NITROSTAT) 0.4 MG SL tablet Place 1 tablet (0.4 mg total) under the tongue every 5 (five) minutes as needed for chest pain.  Marland Kitchen oxybutynin (DITROPAN-XL) 5 MG 24 hr tablet Take 1 tablet (5 mg total) by mouth daily.  . pantoprazole (PROTONIX) 40 MG tablet Take 1 tablet (40 mg total) by mouth daily.  Marland Kitchen sulfamethoxazole-trimethoprim (BACTRIM DS,SEPTRA DS) 800-160 MG tablet Take 1 tablet by mouth 2 (two) times daily.  Marland Kitchen triamcinolone cream (KENALOG) 0.1 % Apply 1 application topically 2 (two) times daily.    Review of Systems  Constitutional: Negative for malaise/fatigue.  Eyes: Negative.   Respiratory: Negative.   Cardiovascular: Negative.   Endocrine: Negative.   Allergic/Immunologic: Negative.   Neurological: Positive for weakness.  Psychiatric/Behavioral: Positive for sleep disturbance. The patient has insomnia.     Social History  Substance Use Topics  . Smoking status: Never Smoker   . Smokeless tobacco: Never Used  . Alcohol Use: No   Objective:   BP 148/72 mmHg  Pulse 72  Temp(Src) 97.8 F (36.6 C) (Oral)  Resp 20  Physical Exam  Constitutional: She is oriented to person, place, and time. She appears well-developed and well-nourished.  Cardiovascular: Normal rate, regular rhythm and normal heart sounds.   Pulmonary/Chest: Effort normal and breath sounds normal. No respiratory distress.  Neurological: She is alert and oriented to person, place, and time.  Chronic left hemiparesis.  Skin:  Cellulitis ulceration on LLE has improved by 50%.  Psychiatric: She has a normal mood and affect. Her behavior is normal.        Assessment & Plan:     1.  Snoring Epworth Scale is 6. Declines sleep study. OSA not likely with clinical presentation. Still possible,however. 2. Insomnia Worsening. Declines sleep study. Start Melatonin as below. If does not improve sx, will start Ambien. Prescription printed today. - Melatonin 10 MG TABS; Take 1 tablet by mouth at bedtime.  Dispense: 30 tablet; Refill: 11 - zolpidem (AMBIEN) 5 MG tablet; Take 1 tablet (5 mg total) by mouth at bedtime as needed for sleep.  Dispense: 30 tablet; Refill: 5  3. Cellulitis of left foot due to methicillin-resistant Staphylococcus aureus Improving. Continue abx. This looks much better. Likely MRSA.    Patient seen and examined by Miguel Aschoff, MD, and note scribed by Renaldo Fiddler, CMA.   Richard Cranford Mon, MD  [facility]

## 2016-01-31 NOTE — Telephone Encounter (Signed)
Patient notified and voiced understanding.

## 2016-01-31 NOTE — Telephone Encounter (Signed)
-----   Message from Hollice Espy, MD sent at 01/29/2016  9:00 PM EDT ----- Repeat UCx negative.  Hollice Espy, MD

## 2016-02-03 ENCOUNTER — Ambulatory Visit: Payer: Self-pay | Admitting: Family Medicine

## 2016-02-05 IMAGING — CR RIGHT FOOT COMPLETE - 3+ VIEW
2 series · 3 of 3 positions shown · non-contrast
Comparison: None.

CLINICAL DATA: Right foot pain.

EXAM:
RIGHT FOOT COMPLETE - 3+ VIEW

[Series 1: kdxr foot rt complete w/obliques · 0.14mm/px · 2 of 2 slices shown (1 of 2)]
[im 1/2]
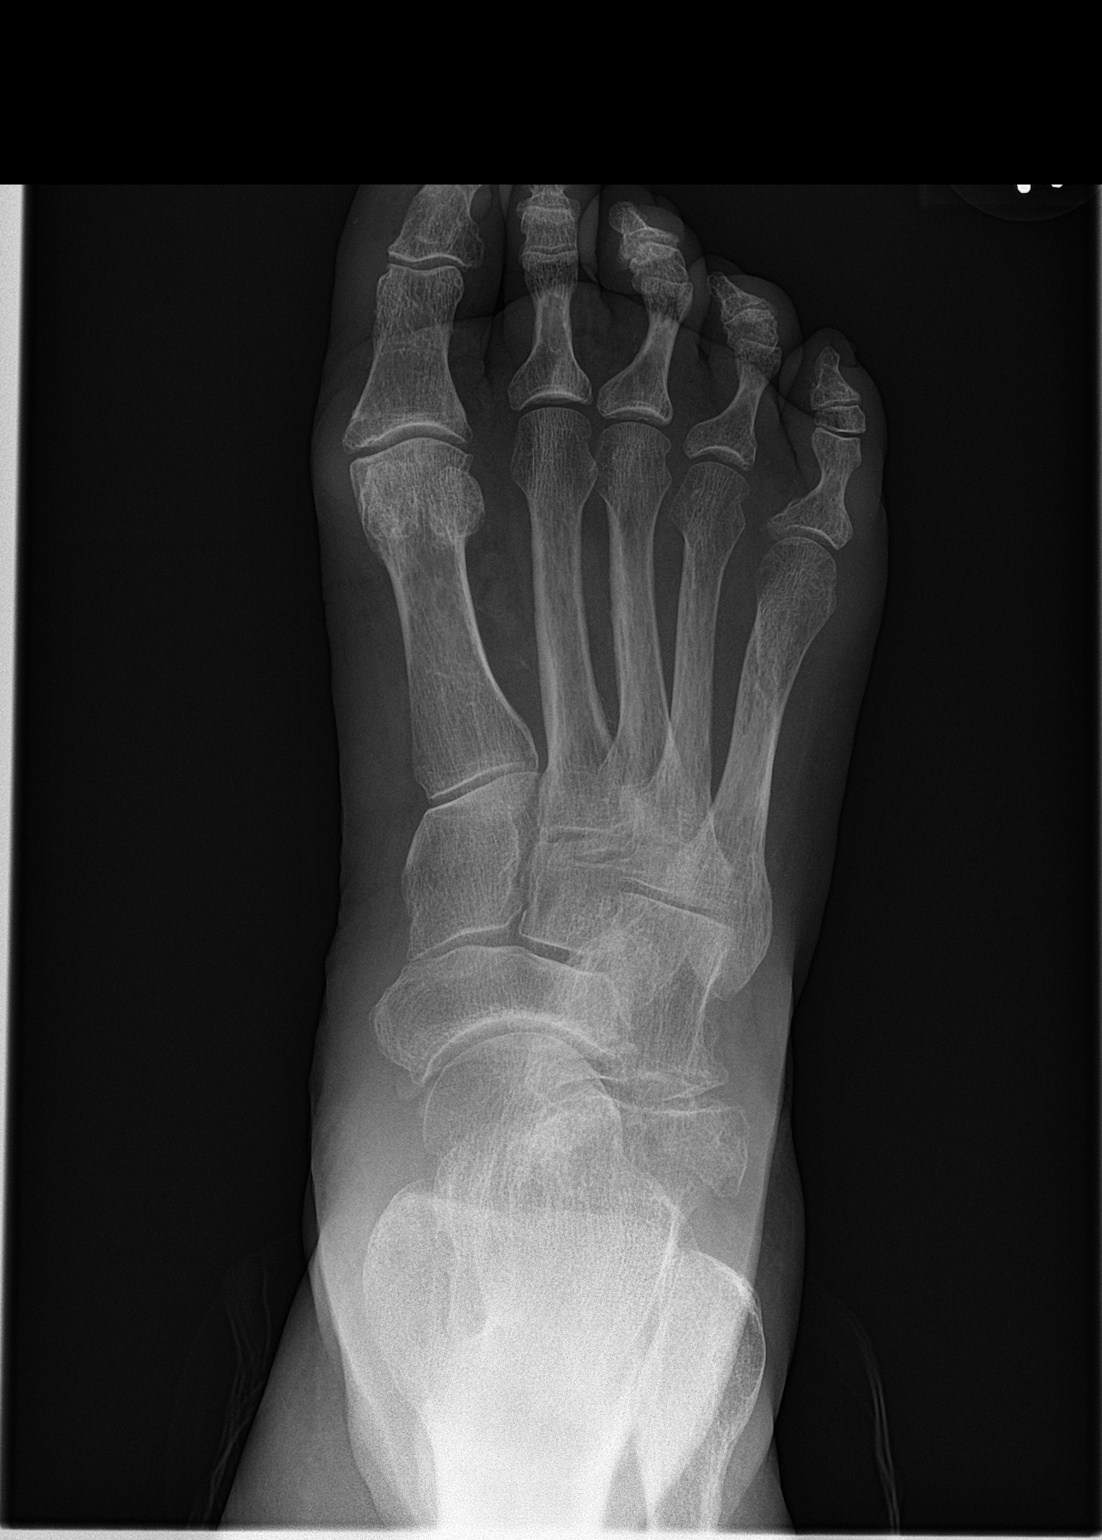
[im 2/2]
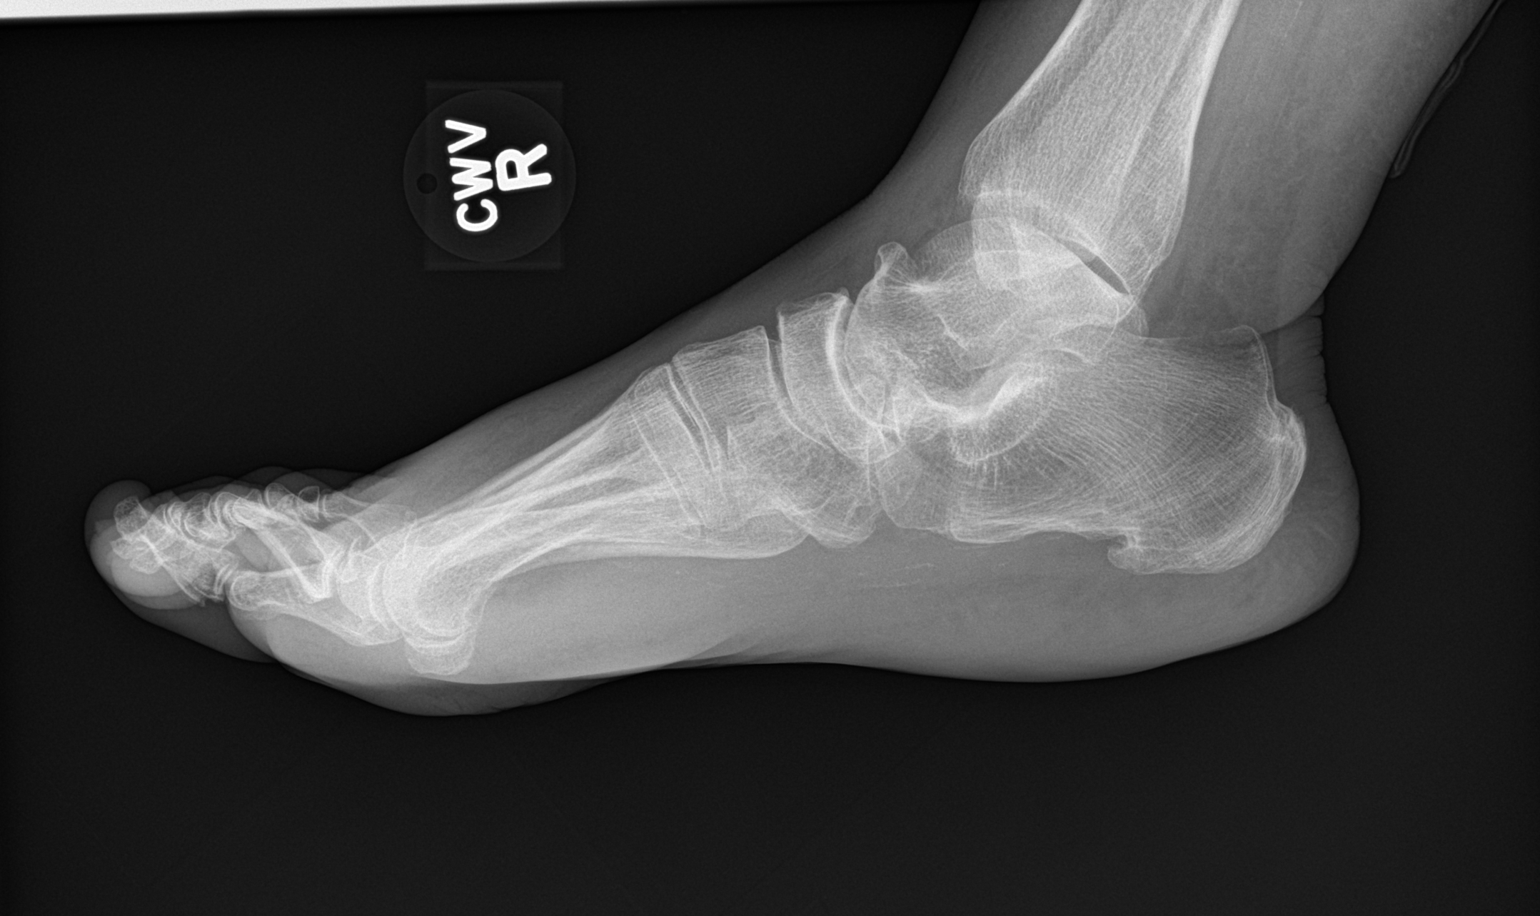

[kdxr foot rt complete w/obliques (2 of 2)]
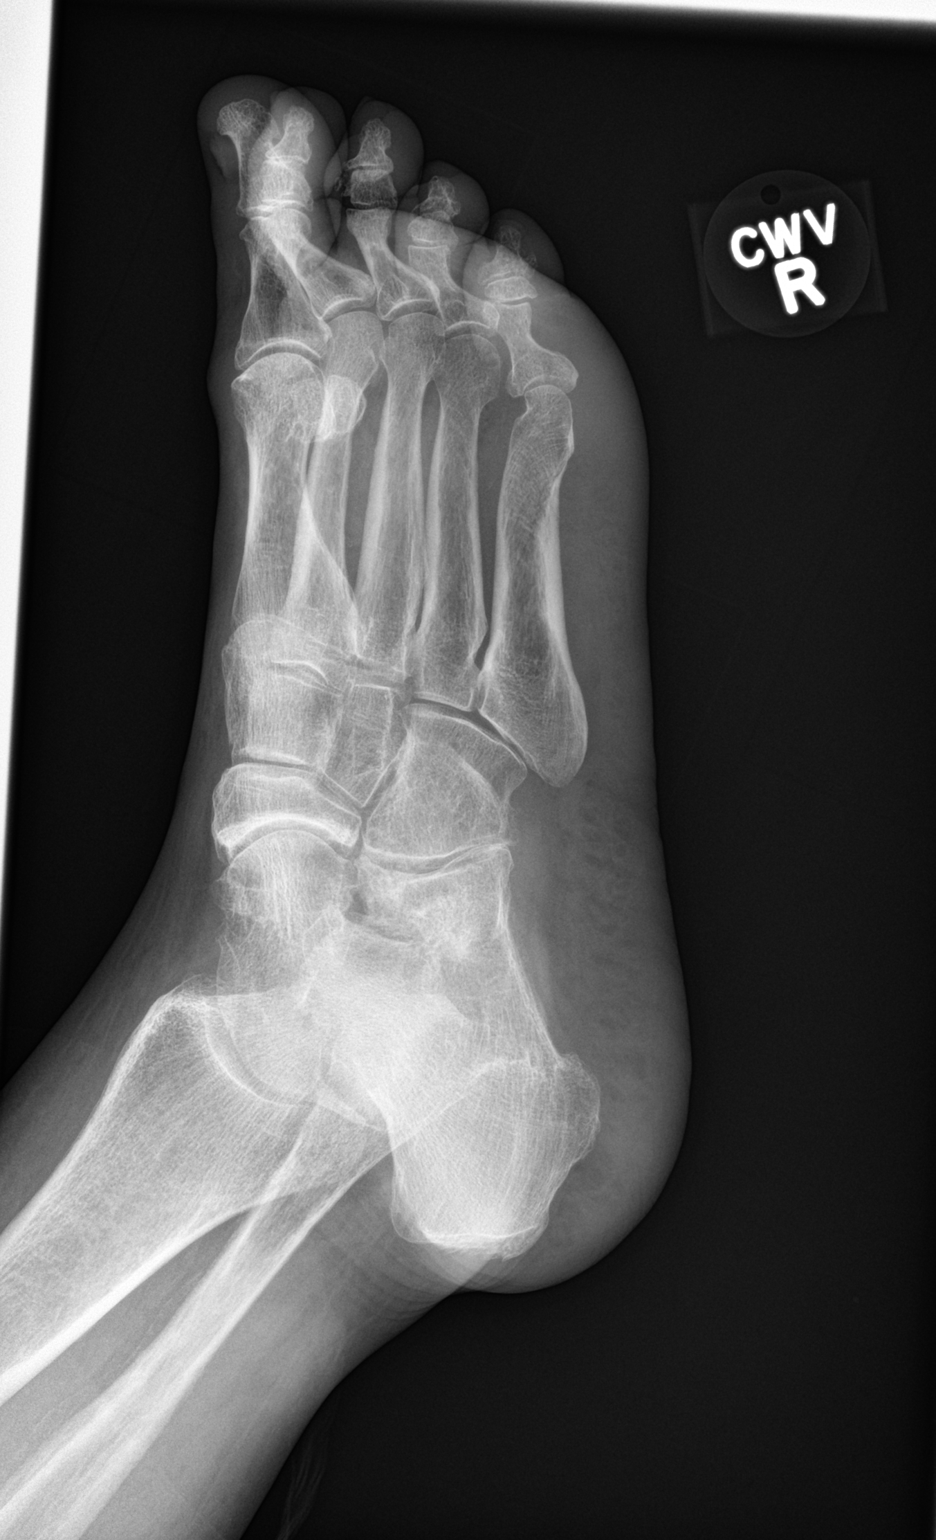

[3 of 3 positions shown; findings below may reference images not displayed]

FINDINGS: Mild irregularity is seen involving the proximal base of the fifth
proximal phalanx which may represent nondisplaced fracture. Joint
spaces are intact. Probable old fracture of distal fifth metatarsal
is noted. Spurring of posterior calcaneus is noted.
IMPRESSION: Possible nondisplaced fracture involving proximal base of the fifth
proximal phalanx.

## 2016-02-08 ENCOUNTER — Telehealth: Payer: Self-pay | Admitting: Family Medicine

## 2016-02-08 NOTE — Telephone Encounter (Signed)
Please review-aa 

## 2016-02-08 NOTE — Telephone Encounter (Signed)
Pt states that she is still having back pain.She wants to know if there is anything that can be added in addition to her Hydrocodone

## 2016-02-10 ENCOUNTER — Other Ambulatory Visit: Payer: Self-pay | Admitting: Family Medicine

## 2016-02-10 ENCOUNTER — Ambulatory Visit: Payer: Self-pay | Admitting: Neurology

## 2016-02-14 ENCOUNTER — Other Ambulatory Visit: Payer: Self-pay

## 2016-02-14 MED ORDER — CYCLOBENZAPRINE HCL 10 MG PO TABS: 10.0000 mg | ORAL_TABLET | Freq: Three times a day (TID) | ORAL | 3 refills | Status: DC | PRN

## 2016-02-15 ENCOUNTER — Telehealth: Payer: Self-pay | Admitting: Family Medicine

## 2016-02-15 NOTE — Telephone Encounter (Signed)
Pt called regarding the Rx of Flexaril that was suppose to be sent in yesterday.  She said she went to pick it up at [facility] but it had not been called in.  I did tell her it could be up to 48 hrs but I would send a message back asking about it.  Thanks Con Memos

## 2016-02-16 ENCOUNTER — Telehealth: Payer: Self-pay | Admitting: Family Medicine

## 2016-02-16 NOTE — Telephone Encounter (Signed)
Last MRI I can find is 2004--has she had one since then?

## 2016-02-16 NOTE — Telephone Encounter (Signed)
Pt is calling to see if the referral/appointment to see a specialist for her back pain has been made.    CB#[phone]/MW

## 2016-02-18 ENCOUNTER — Ambulatory Visit (INDEPENDENT_AMBULATORY_CARE_PROVIDER_SITE_OTHER): Payer: Commercial Managed Care - HMO

## 2016-02-18 VITALS — BP 133/79 | HR 102 | Temp 98.1°F | Wt 277.0 lb

## 2016-02-18 DIAGNOSIS — N390 Urinary tract infection, site not specified: Secondary | ICD-10-CM

## 2016-02-18 LAB — MICROSCOPIC EXAMINATION
Epithelial Cells (non renal): NONE SEEN /hpf (ref 0–10)
RBC, UA: NONE SEEN /hpf (ref 0–?)

## 2016-02-18 LAB — URINALYSIS, COMPLETE
BILIRUBIN UA: NEGATIVE
GLUCOSE, UA: NEGATIVE
KETONES UA: NEGATIVE
Nitrite, UA: NEGATIVE
SPEC GRAV UA: 1.015 (ref 1.005–1.030)
UUROB: 0.2 mg/dL (ref 0.2–1.0)
pH, UA: 7 (ref 5.0–7.5)

## 2016-02-18 NOTE — Progress Notes (Signed)
Pt came in today with c/o possible UTI. Pt described s/s to be foul smelling urine, cloudy urine, increased frequency, not able to hold urine, and nausea. Pt denied f/c and vomiting. Pt VS- 133/79, 102, 98.1, 277lbs. A cath specimen was obtained for u/a and cx.  In and Out Catheterization  Patient is present today for a I & O catheterization due to recurrent UTI. Patient was cleaned and prepped in a sterile fashion with betadine and Lidocaine 2% jelly was instilled into the urethra.  A 14FR cath was inserted no complications were noted , 249ml of urine return was noted, urine was cloudy and yellow in color. A clean urine sample was collected for u/a and cx. Bladder was drained  And catheter was removed with out difficulty.    Preformed by: Toniann Fail, LPN

## 2016-02-21 ENCOUNTER — Telehealth: Payer: Self-pay

## 2016-02-21 DIAGNOSIS — N390 Urinary tract infection, site not specified: Secondary | ICD-10-CM

## 2016-02-21 LAB — CULTURE, URINE COMPREHENSIVE

## 2016-02-21 MED ORDER — AMOXICILLIN-POT CLAVULANATE 875-125 MG PO TABS: 1.0000 | ORAL_TABLET | Freq: Two times a day (BID) | ORAL | 0 refills | Status: AC

## 2016-02-21 NOTE — Telephone Encounter (Signed)
Pt called to get an update about the referral. Pt stated that she had an MRI done within the last year. Pt stated it was done in [place] when she saw a neurosurgeon and the pain clinic in [place]. Please advise. Thanks TNP

## 2016-02-21 NOTE — Telephone Encounter (Signed)
-----   Message from Hollice Espy, MD sent at 02/21/2016 10:47 AM EDT ----- Treat with Augmentin bid x 7 days.  Hollice Espy, MD

## 2016-02-21 NOTE — Telephone Encounter (Signed)
This was addressed during OV today.

## 2016-02-21 NOTE — Telephone Encounter (Signed)
Spoke with pt husband in reference to +ucx. Made aware abx were sent to pharmacy. Husband voiced understanding.

## 2016-02-21 NOTE — Telephone Encounter (Signed)
error 

## 2016-02-22 ENCOUNTER — Telehealth: Payer: Self-pay

## 2016-02-22 NOTE — Telephone Encounter (Signed)
Pt called stating that Sunday she went to the urgent care due to UTI symptoms and dr gave her cipro. Per ucx organism is susceptible to cipro. Pt stated that she would rather stay on cipro than start a new medication. Made pt aware that would be ok.

## 2016-02-22 NOTE — Telephone Encounter (Signed)
error 

## 2016-02-29 ENCOUNTER — Telehealth: Payer: Self-pay | Admitting: Emergency Medicine

## 2016-02-29 NOTE — Telephone Encounter (Signed)
Pt called today about her blood sugar and back pain. She reports that Saturday her blood sugar dropped to 39. They got it back up but last night before bed her blood sugar was 369 and she had not eaten anything that would have caused it. Then this morning it was 109 fasting. She took 30 units of insulin last night before bed. She reports that her blood sugars are all over the place. Then she said she has the same back pain that she has been having but has gotten worse. She does not want to see the neurosurgeon again of the pain clinic, she said that you had mentioned another referral at some point. Please advise. thanks

## 2016-02-29 NOTE — Telephone Encounter (Signed)
According to her message about this prior, she had an MRI last year by the neurosurgeon in [place].

## 2016-02-29 NOTE — Telephone Encounter (Signed)
She might need to be seen by me or dennis to determine if she needs an MRI of her lower spine. I do not see that she has had this done in recent years. Please check behind me to see if I'm right.

## 2016-02-29 NOTE — Telephone Encounter (Signed)
If you can find that in printed for me so I can see what results were. Thanks. I cannot find it in the chart.

## 2016-02-29 NOTE — Telephone Encounter (Signed)
Pt called back and said she was going with her husband to the dr. To please call her cell [phone] or leave a message.  Thanks, [employer]

## 2016-03-01 NOTE — Telephone Encounter (Signed)
I need a copy of the MRI from last year. If we can track down where it was done and have the results sent over that will be great. Thank you. Have patient check sugars at least twice a day and document those. Follow-up in 1-2 weeks regarding sugars

## 2016-03-01 NOTE — Telephone Encounter (Signed)
No record of MRI it was just in a note from last year where patient said she had it done. She said that you mentioned another referral besides neurosurgeon who she does not want to see.  Also sugars are up and down, please see patient message about that.

## 2016-03-01 NOTE — Telephone Encounter (Signed)
Spoke with patient and advised as below, called Dr Arnoldo Morale office for MRI report and they are faxing it over. Please let me know about referral-aa

## 2016-03-06 ENCOUNTER — Ambulatory Visit: Payer: Self-pay | Admitting: Family Medicine

## 2016-03-09 ENCOUNTER — Encounter: Payer: Self-pay | Admitting: Family Medicine

## 2016-03-09 ENCOUNTER — Ambulatory Visit (INDEPENDENT_AMBULATORY_CARE_PROVIDER_SITE_OTHER): Payer: Commercial Managed Care - HMO | Admitting: Family Medicine

## 2016-03-09 VITALS — BP 126/72 | HR 88 | Temp 99.2°F | Resp 16 | Wt 276.0 lb

## 2016-03-09 DIAGNOSIS — G8929 Other chronic pain: Secondary | ICD-10-CM

## 2016-03-09 DIAGNOSIS — M5136 Other intervertebral disc degeneration, lumbar region: Secondary | ICD-10-CM | POA: Diagnosis not present

## 2016-03-09 DIAGNOSIS — J069 Acute upper respiratory infection, unspecified: Secondary | ICD-10-CM | POA: Diagnosis not present

## 2016-03-09 DIAGNOSIS — M1990 Unspecified osteoarthritis, unspecified site: Secondary | ICD-10-CM | POA: Diagnosis not present

## 2016-03-09 DIAGNOSIS — M545 Low back pain: Secondary | ICD-10-CM | POA: Diagnosis not present

## 2016-03-09 DIAGNOSIS — M549 Dorsalgia, unspecified: Secondary | ICD-10-CM

## 2016-03-09 MED ORDER — AZITHROMYCIN 250 MG PO TABS: ORAL_TABLET | ORAL | 0 refills | Status: DC

## 2016-03-09 MED ORDER — HYDROCODONE-ACETAMINOPHEN 10-325 MG PO TABS: 1.0000 | ORAL_TABLET | ORAL | 0 refills | Status: DC | PRN

## 2016-03-09 NOTE — Progress Notes (Signed)
Patient: Susan House Female    DOB: [birth year]   60 y.o.   MRN: [id number] Visit Date: 03/09/2016  Today's Provider: Wilhemena Durie, MD   Chief Complaint  Patient presents with  . URI    X 2 days.   . Back Pain   Subjective:    HPI Patient comes in today c/o cough and congestion. Patient reports that she has had symptoms for 2 days. Patient reports that she has felt feverish, but has not had a fever. Patient reports that she has not been taking anything OTC for symptoms.  Patient also mentions that she is still having pain in her lower back.     Allergies  Allergen Reactions  . Morphine And Related Anaphylaxis  . Fentanyl Other (See Comments)    Unknown reaction.  . Reglan [Metoclopramide]     Elevated BP  . Simvastatin Other (See Comments)    Muscle pain  . Betadine [Povidone Iodine] Rash  . Iodine Rash  . Tetracyclines & Related Rash   No outpatient prescriptions have been marked as taking for the 03/09/16 encounter (Office Visit) with Jerrol Banana., MD.    Review of Systems  Constitutional: Positive for fatigue and fever.       Has felt feverish.  HENT: Positive for congestion, postnasal drip, rhinorrhea, sneezing and sore throat. Negative for dental problem, drooling, ear discharge, ear pain, facial swelling, hearing loss, mouth sores, nosebleeds, sinus pressure, tinnitus, trouble swallowing and voice change.   Eyes: Negative.   Respiratory: Positive for cough. Negative for apnea, choking, chest tightness, shortness of breath, wheezing and stridor.   Cardiovascular: Negative.   Endocrine: Negative.   Genitourinary: Negative.   Musculoskeletal: Positive for arthralgias, back pain, gait problem and myalgias. Negative for joint swelling, neck pain and neck stiffness.  Neurological: Positive for weakness. Negative for dizziness and numbness.       Chronic left hemiparesis  Psychiatric/Behavioral: Negative.     Social History  Substance Use  Topics  . Smoking status: Never Smoker  . Smokeless tobacco: Never Used  . Alcohol use No   Objective:   BP 126/72 (BP Location: Right Arm, Patient Position: Sitting, Cuff Size: Normal)   Pulse 88   Temp 99.2 F (37.3 C)   Resp 16   Wt 276 lb (125.2 kg)   BMI 52.15 kg/m   Physical Exam  Constitutional: She is oriented to person, place, and time. She appears well-developed and well-nourished.  HENT:  Head: Normocephalic and atraumatic.  Right Ear: External ear normal.  Left Ear: External ear normal.  Nose: Nose normal.  Eyes: Conjunctivae are normal. No scleral icterus.  Neck: No JVD present. No thyromegaly present.  Cardiovascular: Normal rate, regular rhythm and normal heart sounds.   Pulmonary/Chest: Effort normal and breath sounds normal.  Abdominal: Soft.  Musculoskeletal: She exhibits tenderness.  Mild tenderness along the LS spinal  around the joint line. There is no tenderness over vertebral bodies  Lymphadenopathy:    She has no cervical adenopathy.  Neurological: She is alert and oriented to person, place, and time.  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  Psychiatric: She has a normal mood and affect. Her behavior is normal. Judgment and thought content normal.        Assessment & Plan:     1. Chronic low back pain MRI from Dr. Arnoldo Morale is now entered into the office notes. This is  from a year ago but it does not appear that she has a surgical problem from the MRI - Ambulatory referral to Physical Therapy  2. Upper respiratory infection/Bronchitis zpak if she worsens. - azithromycin (ZITHROMAX) 250 MG tablet; Take 2 tablets today, then 1 tablet daily thereafter.  Dispense: 6 tablet; Refill: 0  3. DDD (degenerative disc disease), lumbar   4. Arthritis  5. Morbid obesity       Wilhemena Durie, MD  [facility]

## 2016-03-10 IMAGING — CR DG ABDOMEN 1V
1 series · 3 of 3 positions shown · non-contrast
Comparison: Lumbar spine series [DATE].

CLINICAL DATA: Abdominal pain.  Pressure.  Hematuria .

EXAM:
ABDOMEN - 1 VIEW

[Series 1: kdxr kidney ureter bladder · 0.14mm/px · 3 of 3 slices shown]
[im 1/3]
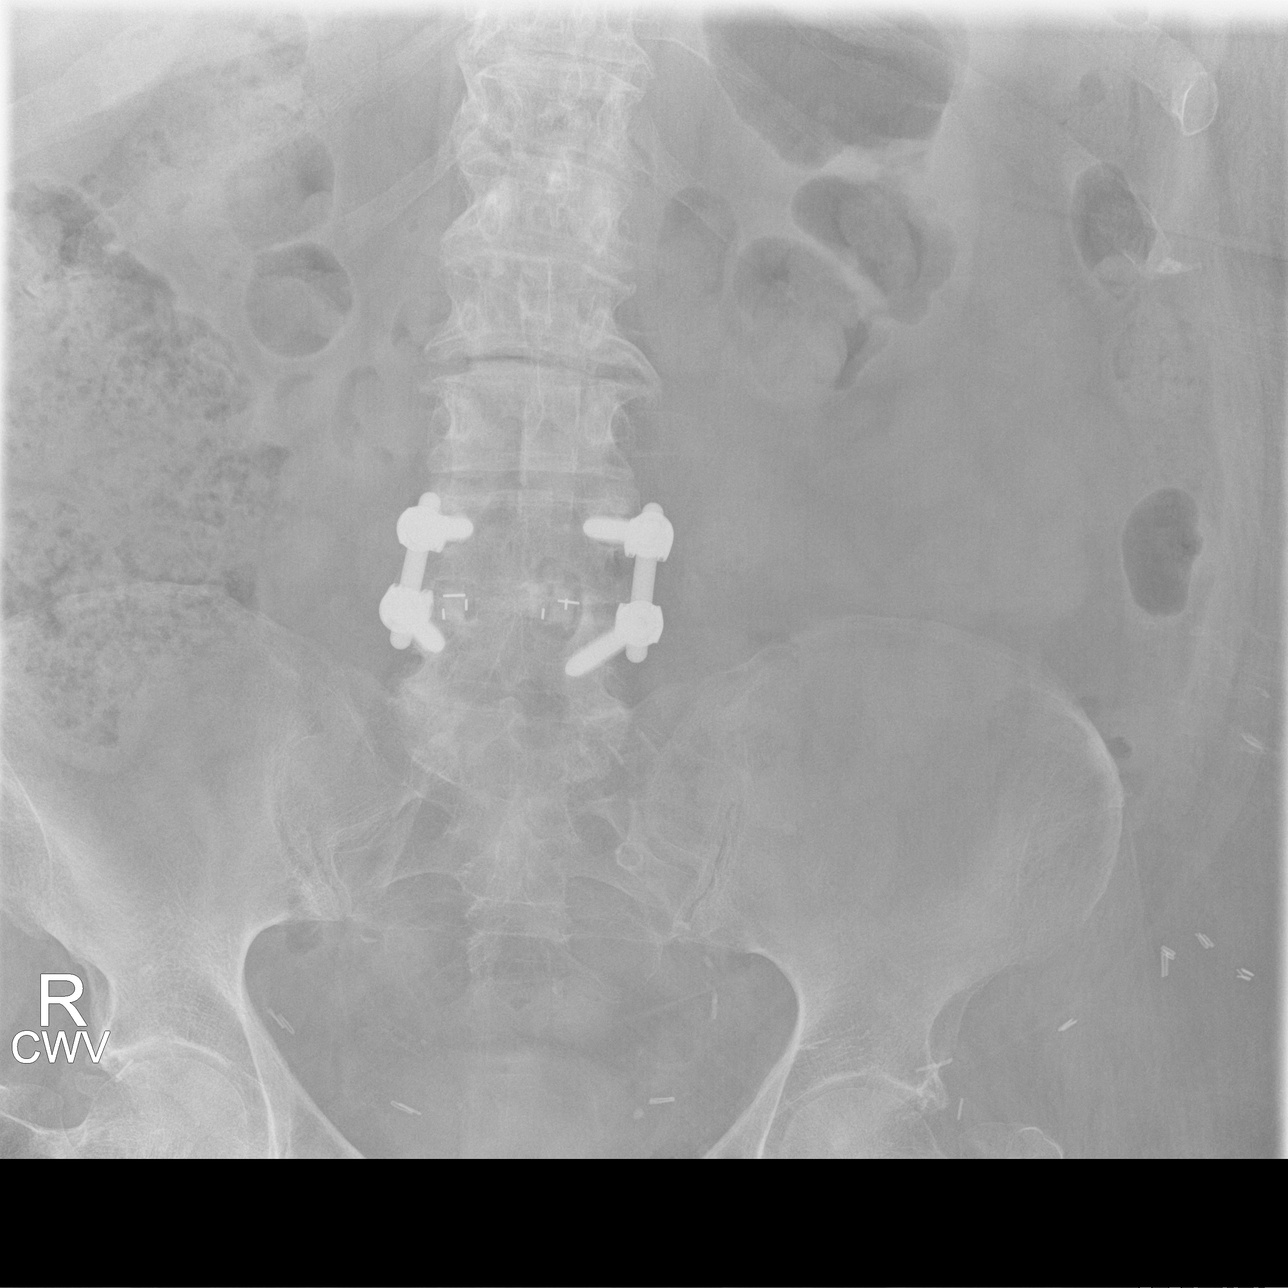
[im 2/3]
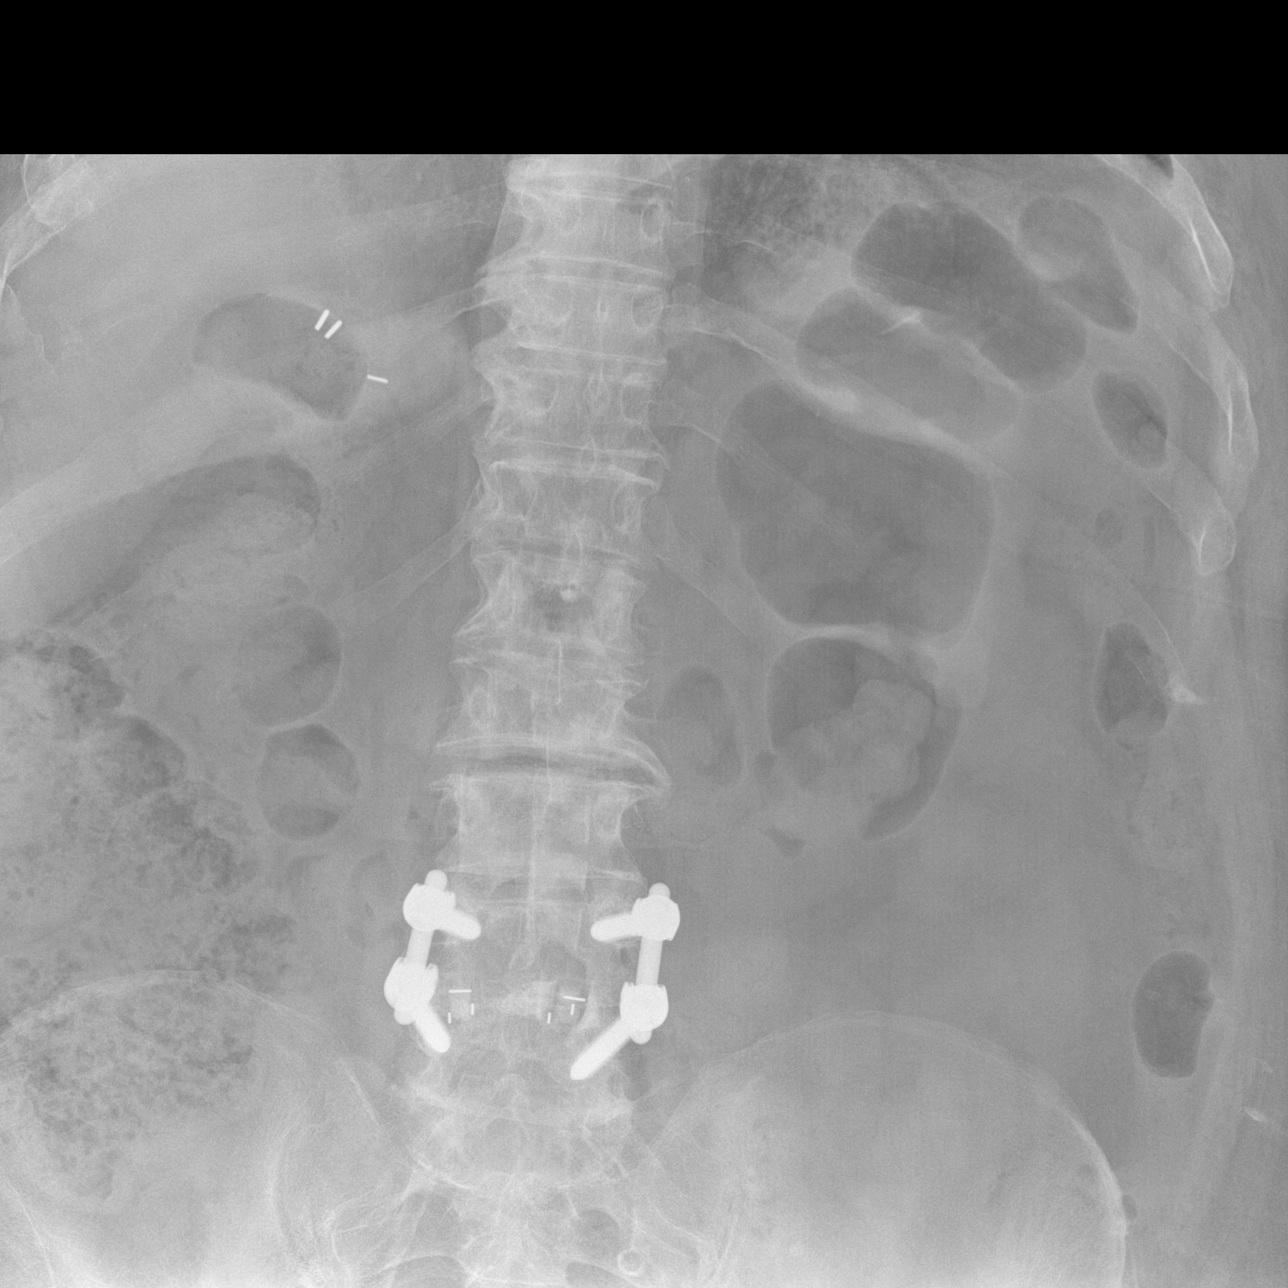
[im 3/3]
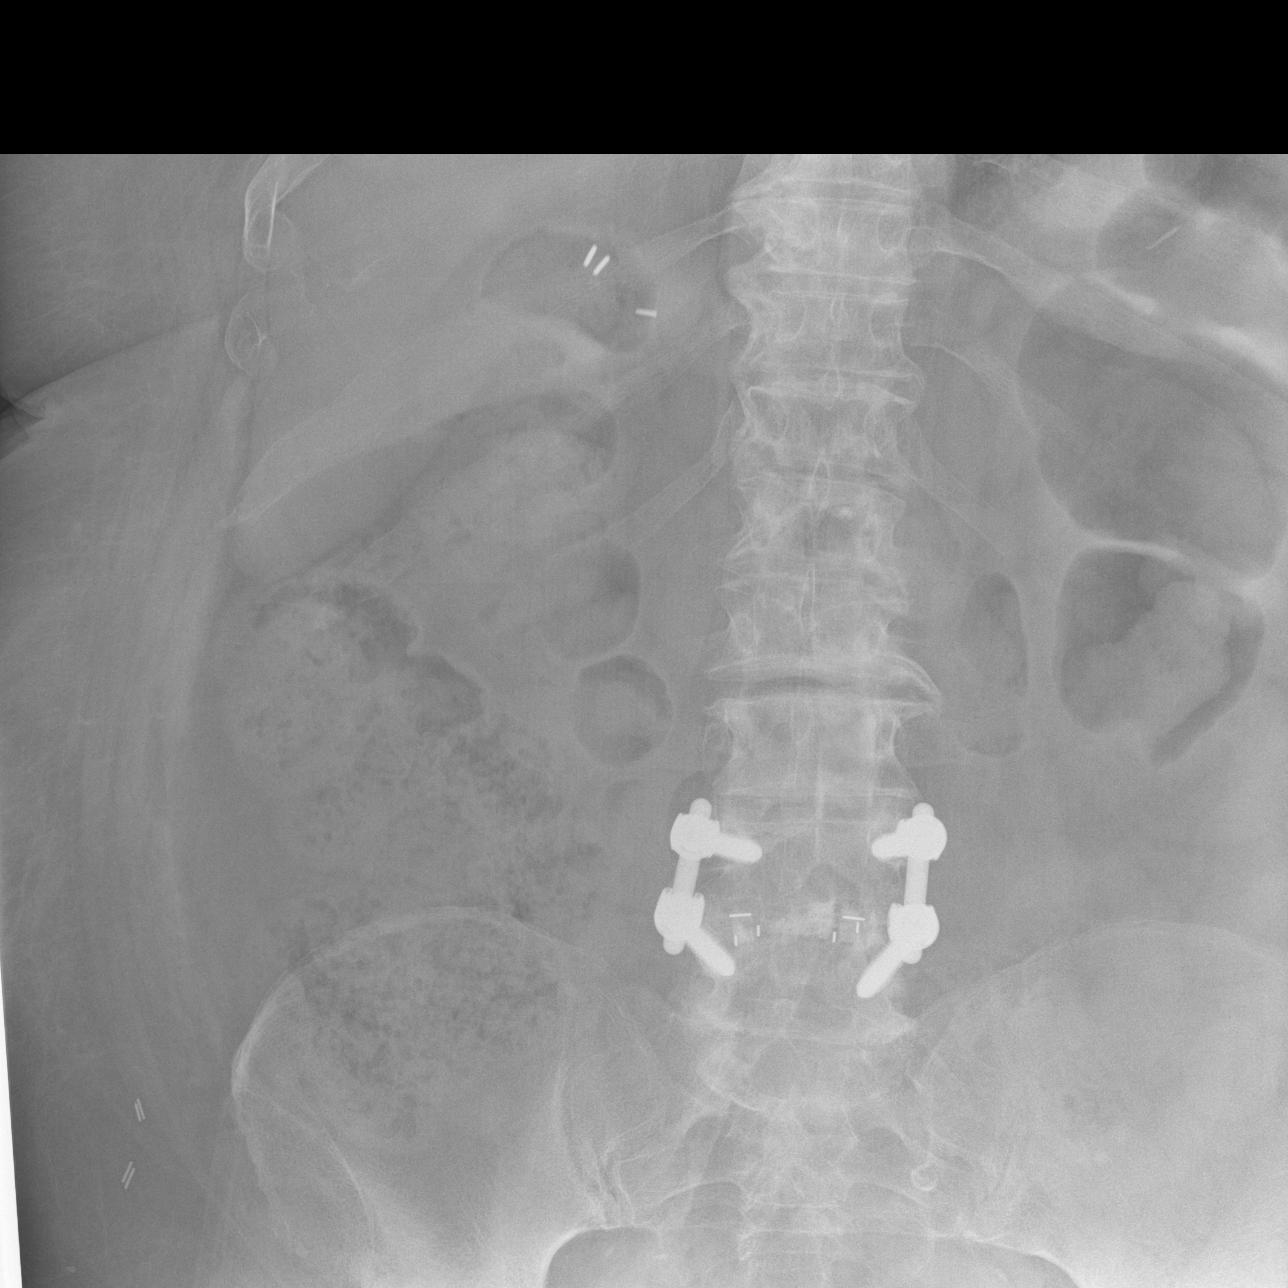

[3 of 3 positions shown; findings below may reference images not displayed]

FINDINGS: Soft tissue structures are unremarkable. Gas pattern is nonspecific.
Stool noted throughout the colon. Surgical clips are noted pelvis.
Prior lumbar spine fusion. Peripheral vascular calcification. Mild
infiltrate left lung base cannot be excluded. Cardiac calcification
noted.
IMPRESSION: 1. No evidence of nephrolithiasis.
2. Degenerative changes of postsurgical changes lumbar spine.
3. Peripheral vascular disease.
4. Left lower lobe infiltrate, possibly chronic.

## 2016-03-15 ENCOUNTER — Telehealth: Payer: Self-pay | Admitting: Family Medicine

## 2016-03-15 NOTE — Telephone Encounter (Signed)
Pt called and has questions about physical therapy/MW

## 2016-03-15 NOTE — Telephone Encounter (Signed)
FYI  Patient states she forgot to tell you that she just finished physical therapy for strength and balance and all to help back so I told her not to do another one since she just finished her secessions.-aa

## 2016-03-29 ENCOUNTER — Ambulatory Visit: Payer: Commercial Managed Care - HMO | Admitting: Family Medicine

## 2016-03-29 VITALS — BP 128/78 | HR 76 | Ht 61.0 in | Wt 278.0 lb

## 2016-03-29 DIAGNOSIS — N390 Urinary tract infection, site not specified: Secondary | ICD-10-CM

## 2016-03-29 LAB — MICROSCOPIC EXAMINATION

## 2016-03-29 LAB — URINALYSIS, COMPLETE
Bilirubin, UA: NEGATIVE
Glucose, UA: NEGATIVE
Nitrite, UA: NEGATIVE
PH UA: 5 (ref 5.0–7.5)
Specific Gravity, UA: 1.025 (ref 1.005–1.030)
Urobilinogen, Ur: 0.2 mg/dL (ref 0.2–1.0)

## 2016-03-29 NOTE — Progress Notes (Signed)
In and Out Catheterization  Patient is present today for a I & O catheterization due to recurrent UTI. Patient was cleaned and prepped in a sterile fashion with Hybiclens.  A 16FR cath was inserted no complications were noted , 57ml of urine return was noted, urine was yellow in color. A clean urine sample was collected for UA, Culture. Bladder was drained  And catheter was removed with out difficulty.    Preformed by: Elberta Leatherwood, CMA

## 2016-03-30 ENCOUNTER — Ambulatory Visit: Payer: Self-pay | Admitting: Family Medicine

## 2016-04-03 ENCOUNTER — Telehealth: Payer: Self-pay

## 2016-04-03 ENCOUNTER — Other Ambulatory Visit: Payer: Self-pay | Admitting: Urology

## 2016-04-03 DIAGNOSIS — N390 Urinary tract infection, site not specified: Secondary | ICD-10-CM

## 2016-04-03 LAB — CULTURE, URINE COMPREHENSIVE

## 2016-04-03 MED ORDER — AMOXICILLIN-POT CLAVULANATE 875-125 MG PO TABS: 1.0000 | ORAL_TABLET | Freq: Two times a day (BID) | ORAL | 0 refills | Status: DC

## 2016-04-03 NOTE — Telephone Encounter (Signed)
Spoke with pt in reference to ucx results and augmentin. Pt stated that she has been on augmentin for the past 6 weeks. Please advise.

## 2016-04-03 NOTE — Telephone Encounter (Signed)
-----   Message from Nori Riis, PA-C sent at 04/03/2016 12:40 PM EDT ----- Please notify the patient that her urine culture was positive.  I have sent a prescription to [facility] for Augmentin for seven days.  She needs to keep her appointment in 05/2016.

## 2016-04-03 NOTE — Telephone Encounter (Signed)
Why has she been on Augmentin for the last six weeks?

## 2016-04-04 ENCOUNTER — Telehealth: Payer: Self-pay | Admitting: *Deleted

## 2016-04-04 MED ORDER — CIPROFLOXACIN HCL 500 MG PO TABS: 500.0000 mg | ORAL_TABLET | Freq: Two times a day (BID) | ORAL | 0 refills | Status: DC

## 2016-04-04 NOTE — Telephone Encounter (Signed)
Discontinue the Augmentin and start Cipro 500 mg, twice daily for seven days.

## 2016-04-04 NOTE — Telephone Encounter (Signed)
Pt states UTI and medication was given by PCP.

## 2016-04-04 NOTE — Telephone Encounter (Signed)
Patient called to state she saw Dr.  Sharlotte Alamo at [facility] clinic for her toe. She states they are recommending toe amputation. She does not agree and wanted your opinion. Per Rosann Auerbach before appointment made, ask you first.

## 2016-04-04 NOTE — Telephone Encounter (Signed)
Spoke with pt in reference to augmentin. Made pt aware to take cipro instead. Pt voiced understanding. Medication sent to pharmacy.

## 2016-04-05 ENCOUNTER — Encounter: Payer: Self-pay | Admitting: *Deleted

## 2016-04-05 ENCOUNTER — Telehealth: Payer: Self-pay | Admitting: Family Medicine

## 2016-04-05 NOTE — Telephone Encounter (Signed)
ok 

## 2016-04-05 NOTE — Telephone Encounter (Signed)
Is it okay to order?  Thanks,   -Mickel Baas

## 2016-04-05 NOTE — Telephone Encounter (Signed)
Wells Guiles from Dr Dwyane Luo office called requesting [facility] referral for pt to be seen for possible amputation of left toe which Dr Cleda Mccreedy recommended

## 2016-04-05 NOTE — Telephone Encounter (Signed)
OK 

## 2016-04-06 ENCOUNTER — Ambulatory Visit: Payer: Self-pay | Admitting: Family Medicine

## 2016-04-06 ENCOUNTER — Ambulatory Visit (INDEPENDENT_AMBULATORY_CARE_PROVIDER_SITE_OTHER): Payer: Commercial Managed Care - HMO | Admitting: Family Medicine

## 2016-04-06 VITALS — BP 104/62 | HR 78 | Temp 98.4°F | Resp 16

## 2016-04-06 DIAGNOSIS — I69359 Hemiplegia and hemiparesis following cerebral infarction affecting unspecified side: Secondary | ICD-10-CM

## 2016-04-06 DIAGNOSIS — L209 Atopic dermatitis, unspecified: Secondary | ICD-10-CM

## 2016-04-06 DIAGNOSIS — M1990 Unspecified osteoarthritis, unspecified site: Secondary | ICD-10-CM

## 2016-04-06 DIAGNOSIS — E1122 Type 2 diabetes mellitus with diabetic chronic kidney disease: Secondary | ICD-10-CM | POA: Diagnosis not present

## 2016-04-06 DIAGNOSIS — G8929 Other chronic pain: Secondary | ICD-10-CM

## 2016-04-06 DIAGNOSIS — M545 Low back pain: Secondary | ICD-10-CM

## 2016-04-06 DIAGNOSIS — R42 Dizziness and giddiness: Secondary | ICD-10-CM

## 2016-04-06 DIAGNOSIS — N183 Chronic kidney disease, stage 3 (moderate): Secondary | ICD-10-CM

## 2016-04-06 MED ORDER — MECLIZINE HCL 25 MG PO TABS: 25.0000 mg | ORAL_TABLET | Freq: Three times a day (TID) | ORAL | 3 refills | Status: DC | PRN

## 2016-04-06 MED ORDER — MOMETASONE FUROATE 0.1 % EX CREA: TOPICAL_CREAM | CUTANEOUS | 2 refills | Status: DC
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
## 2016-04-06 NOTE — Telephone Encounter (Signed)
Please order-aa

## 2016-04-08 IMAGING — CT CT ABD-PELV W/O CM
2 of 4 series · 16 of 46 positions shown, 18 images · non-contrast
Comparison: July 28, 2005

CLINICAL DATA: Dysuria with painless microscopic hematuria

EXAM:
CT ABDOMEN AND PELVIS WITHOUT CONTRAST
TECHNIQUE: Multidetector CT imaging of the abdomen and pelvis was performed
following the standard protocol without oral or intravenous contrast
material administration.

[Series 2: routine abd pel without · axial · non-contrast · 0.98mm/px · z∈[-464,-34]mm · 13 of 96 slices shown, 15 images]
[im 5/96  soft-tissue]
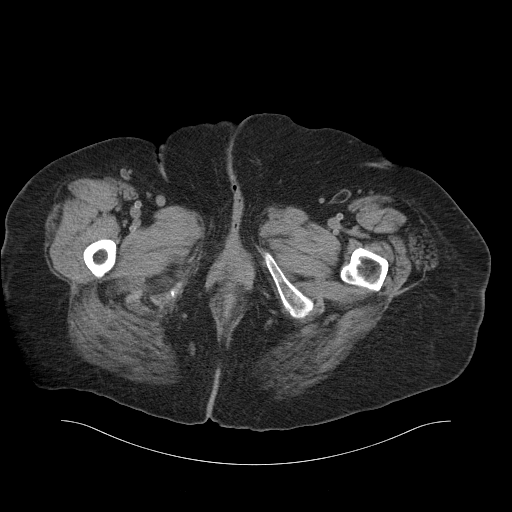
[im 5/96  bone]
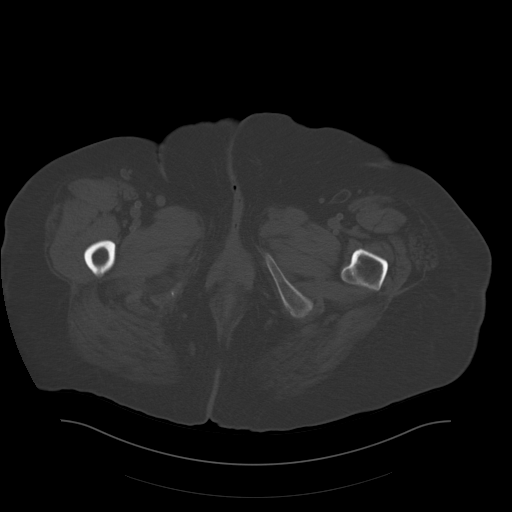
[im 13/96  soft-tissue]
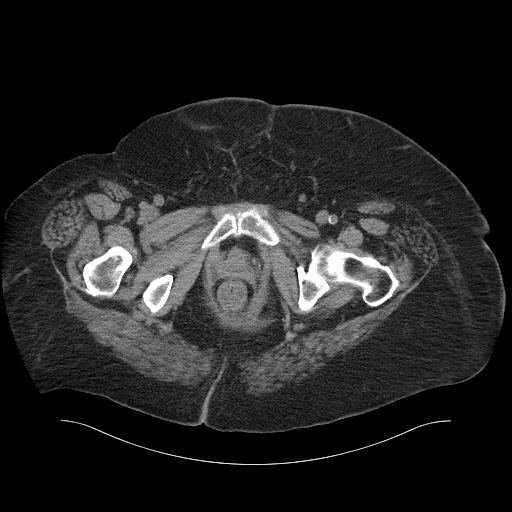
[im 21/96  soft-tissue]
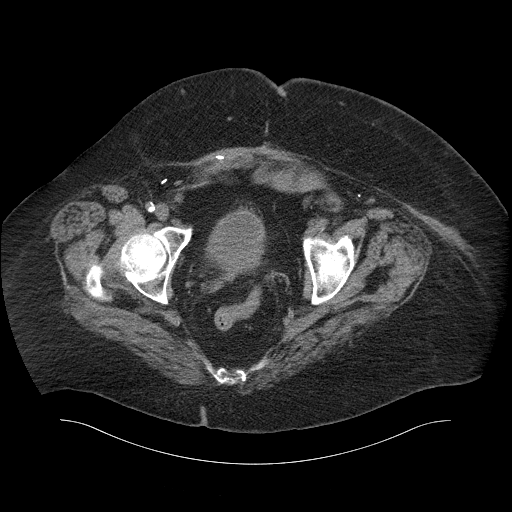
[im 25/96  soft-tissue]
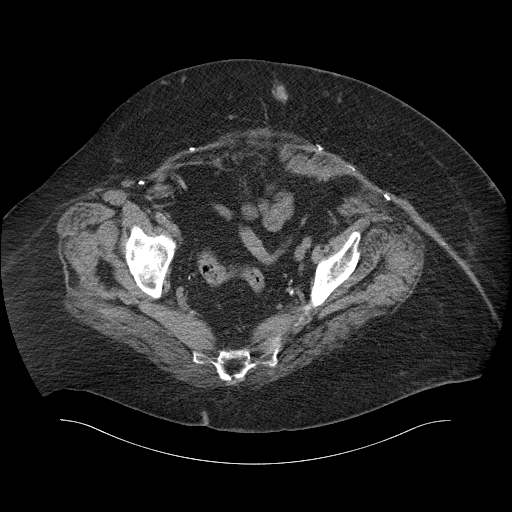
[im 34/96  soft-tissue]
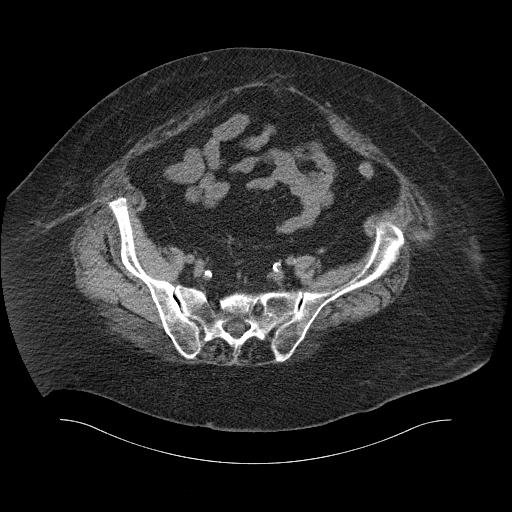
[im 42/96  soft-tissue]
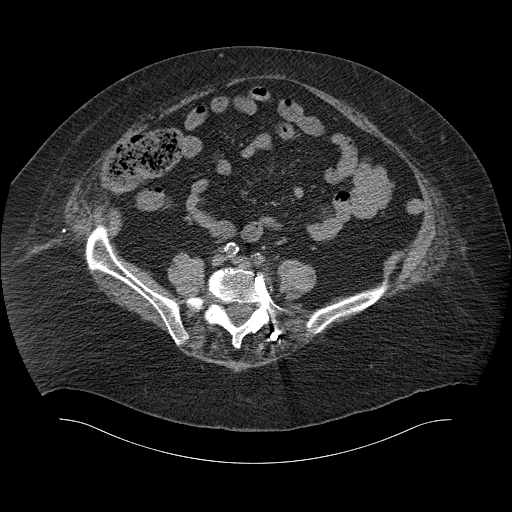
[im 50/96  soft-tissue]
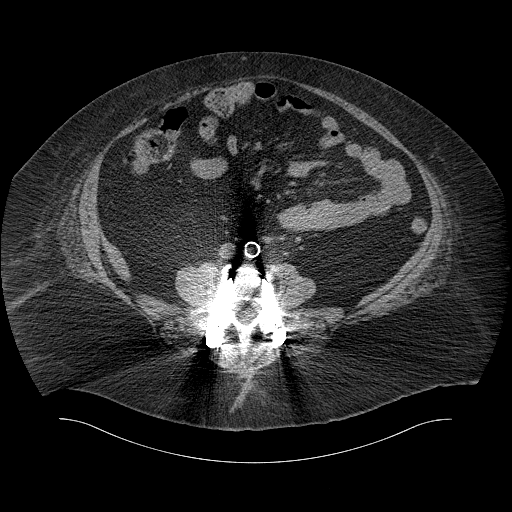
[im 54/96  soft-tissue]
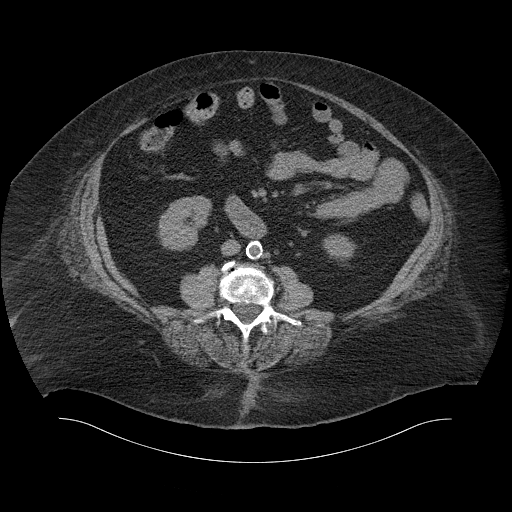
[im 62/96  soft-tissue]
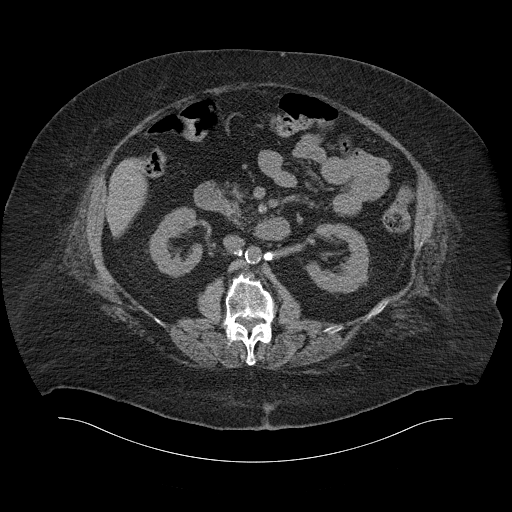
[im 62/96  bone]
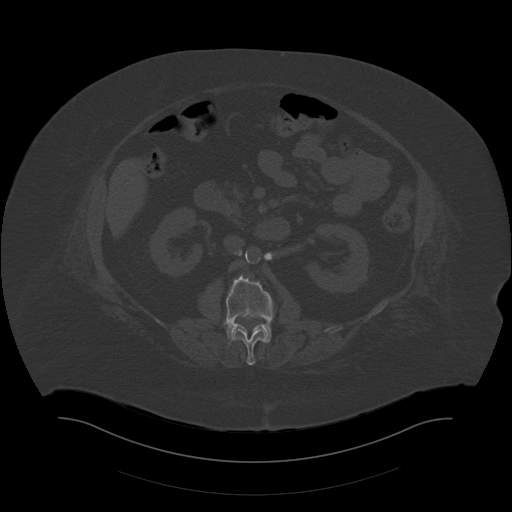
[im 71/96  soft-tissue]
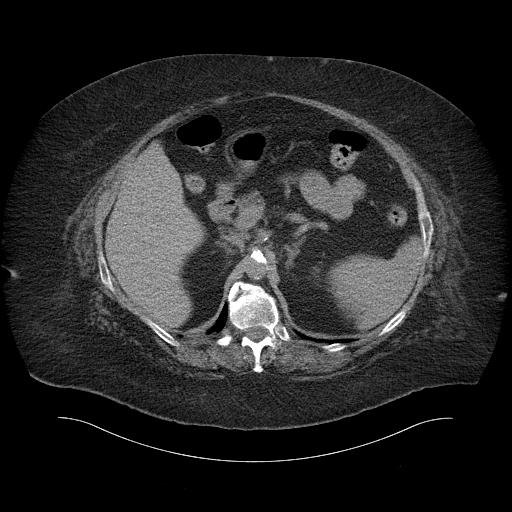
[im 75/96  soft-tissue]
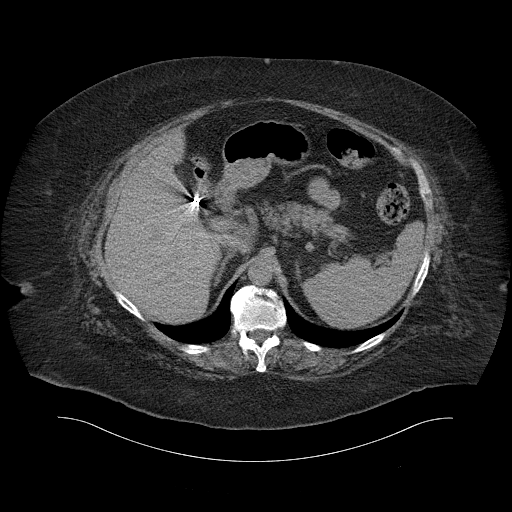
[im 83/96  soft-tissue]
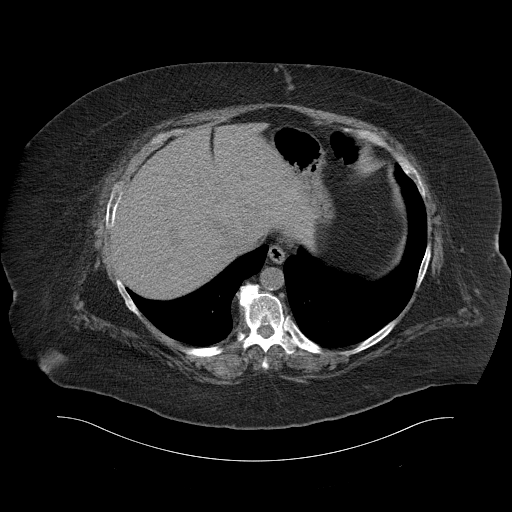
[im 91/96  soft-tissue]
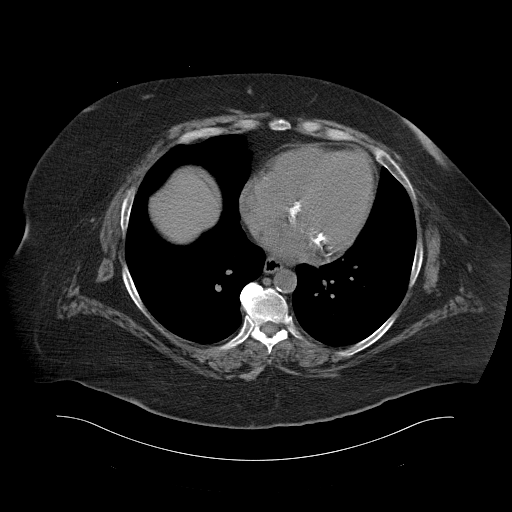

[Series 5: cor routine abd pel wo · coronal · 0.93mm/px · 3 of 200 slices shown]
[im 67/200  soft-tissue]
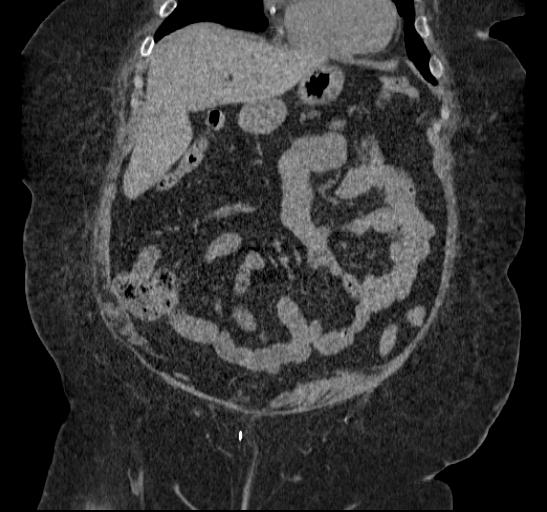
[im 89/200  soft-tissue]
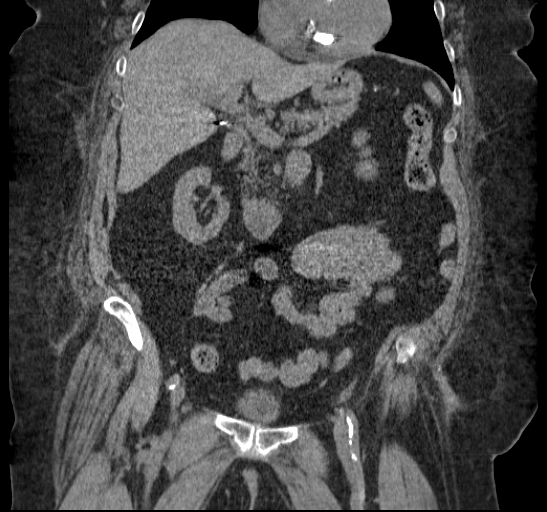
[im 111/200  soft-tissue]
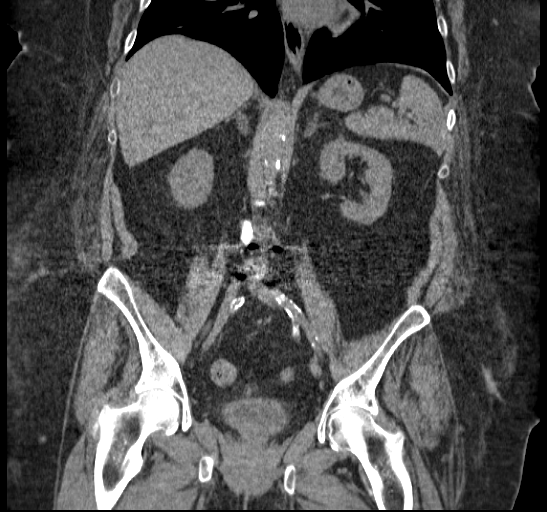

[16 of 46 positions shown; findings below may reference images not displayed]

FINDINGS: Lung bases are clear. There is a prosthetic mitral valve. There is
minimal pericardial thickening.

Liver is prominent measuring 18.0 cm in length. No focal liver
lesions are identified on this noncontrast enhanced study.
Gallbladder is absent. There is no appreciable biliary duct
dilatation.

Spleen and pancreas appear normal. Mild bilateral adrenal
hypertrophy is stable.

There is a 2 mm nonobstructing calculus in the lower pole left
kidney, seen on axial slice 40 series 2 and coronal slice 104 series
5. No other intrarenal calculi are identified. There is no renal
mass or hydronephrosis appreciable on either side. There is no
ureteral calculus seen on either side.

In the pelvis, the urinary bladder is partially decompressed. The
wall of the urinary bladder appears mildly thickened. Uterus is
absent. There is no pelvic mass or fluid collection. Appendix is not
seen. There is no periappendiceal region inflammation.

There is no bowel obstruction. No free air or portal venous air.
There is a small ventral hernia containing only fat.

There is no appreciable ascites, adenopathy, or abscess in the
abdomen or pelvis. There is atherosclerotic change in the aorta but
no aneurysm. There is extensive calcification at the origins of the
celiac and superior mesenteric arteries. There is also extensive
calcification in both proximal renal arteries. There is no evidence
of bowel ischemia. There is extensive postoperative change in the
lumbar spine at L4 and L5 as well as extensive lumbar arthropathy.
There are no blastic or bone lesions.
IMPRESSION: 2 mm nonobstructing calculus lower pole left kidney. No
hydronephrosis or ureteral calculus on either side. No renal masses
are appreciable on this noncontrast enhanced study.

Extensive atherosclerotic change as noted above.

Small ventral hernia containing only fat.

Liver prominent without focal lesion.  Gallbladder absent.

Extensive postoperative change in the lumbar spine as well as
extensive degenerative type change.

No bowel obstruction.  No abscess.

Mild urinary bladder wall thickening.  Suspect a degree of cystitis.

## 2016-04-11 ENCOUNTER — Other Ambulatory Visit: Payer: Self-pay

## 2016-04-11 DIAGNOSIS — L03039 Cellulitis of unspecified toe: Secondary | ICD-10-CM

## 2016-04-13 ENCOUNTER — Ambulatory Visit (INDEPENDENT_AMBULATORY_CARE_PROVIDER_SITE_OTHER): Payer: Commercial Managed Care - HMO | Admitting: General Surgery

## 2016-04-13 ENCOUNTER — Encounter: Payer: Self-pay | Admitting: General Surgery

## 2016-04-13 VITALS — BP 180/84 | HR 84 | Resp 16 | Ht 63.0 in | Wt 276.0 lb

## 2016-04-13 DIAGNOSIS — L97509 Non-pressure chronic ulcer of other part of unspecified foot with unspecified severity: Secondary | ICD-10-CM | POA: Insufficient documentation

## 2016-04-13 DIAGNOSIS — L97529 Non-pressure chronic ulcer of other part of left foot with unspecified severity: Secondary | ICD-10-CM

## 2016-04-13 NOTE — Patient Instructions (Signed)
Return as needed

## 2016-04-13 NOTE — Progress Notes (Signed)
Patient ID: Susan House, female   DOB: Sep 13, 1955, 60 y.o.   MRN: [id number]  Chief Complaint  Patient presents with  . Other    ulcer on toe    HPI Susan House is a 60 y.o. female here today for a evaluation of a ulcer on toe. Patient states she noticed this about three months or so. No drainage and no pain.X-ray performedAt her last visit with Dr. Cleda Mccreedy in May 2017. HPI  Past Medical History:  Diagnosis Date  . Allergy   . Arthritis   . Cellulitis and abscess of face   . Depression   . Diabetes mellitus ([place])   . Edema   . GERD (gastroesophageal reflux disease)   . Hematuria   . History of echocardiogram    2008: normal, 2011: normal LVSF  . History of nuclear stress test    a. 12/2009: lexiscan - negative  . Hyperlipidemia   . Hypertension   . IBS (irritable bowel syndrome)   . Migraine   . Morbid obesity ([place])   . Nocturia   . Stroke ([place]) 2000  . TIA (transient ischemic attack) 2010  . Urgency of micturation   . Urinary frequency   . Urinary incontinence     Past Surgical History:  Procedure Laterality Date  . ABDOMINAL HYSTERECTOMY    . ABDOMINOPLASTY    . APPENDECTOMY    . BACK SURGERY     extensive spinal fusion  . carpel tunnel surgery    . CATARACT EXTRACTION     x 2  . CESAREAN SECTION    . CHOLECYSTECTOMY    . COLONOSCOPY  2010  . EYE SURGERY    . HERNIA REPAIR    . SHOULDER SURGERY    . TOE SURGERY    . UPPER GI ENDOSCOPY    . WRIST FRACTURE SURGERY Left     Family History  Problem Relation Age of Onset  . Hyperlipidemia Mother   . Arrhythmia Mother     WPW  . Heart attack Father 6  . Hyperlipidemia Father   . Hypertension Father   . Hypertension Mother   . Rheum arthritis Mother   . Heart disease Mother   . Stroke Father   . Diabetes Father   . Heart disease Father   . Coronary artery disease Father   . Diabetes Sister   . Hyperlipidemia Sister   . Hypertension Sister   . Depression Sister   . Depression Sister    . Diabetes Sister   . Hypertension Sister   . Kidney disease Paternal Grandmother     Social History Social History  Substance Use Topics  . Smoking status: Never Smoker  . Smokeless tobacco: Never Used  . Alcohol use No    Allergies  Allergen Reactions  . Morphine And Related Anaphylaxis  . Fentanyl Other (See Comments)    Unknown reaction.  . Reglan [Metoclopramide]     Elevated BP  . Simvastatin Other (See Comments)    Muscle pain  . Betadine [Povidone Iodine] Rash  . Iodine Rash  . Tetracyclines & Related Rash    Current Outpatient Prescriptions  Medication Sig Dispense Refill  . amLODipine (NORVASC) 10 MG tablet Take 1 tablet (10 mg total) by mouth daily. 30 tablet 0  . aspirin 325 MG tablet Take 325 mg by mouth daily.    Marland Kitchen atorvastatin (LIPITOR) 20 MG tablet Take 1 tablet (20 mg total) by mouth at bedtime. 90 tablet 3  .  Blood Glucose Monitoring Suppl (ACCU-CHEK AVIVA PLUS) w/Device KIT Check sugar three times daily DX E11.9-needs a meter 1 kit 0  . clopidogrel (PLAVIX) 75 MG tablet TAKE 1 TABLET EVERY DAY 90 tablet 3  . Cranberry 1000 MG CAPS Take 2,000 mg by mouth daily.     . enalapril (VASOTEC) 20 MG tablet TAKE 1 TABLET TWICE DAILY 180 tablet 3  . glucose blood (ACCU-CHEK AVIVA) test strip Check sugar three times daily DX E11.9 100 each 3  . hydrochlorothiazide (HYDRODIURIL) 25 MG tablet TAKE 1 TABLET EVERY DAY 90 tablet 3  . HYDROcodone-acetaminophen (NORCO) 10-325 MG tablet Take 1-2 tablets by mouth every 4 (four) hours as needed. 180 tablet 0  . insulin aspart protamine- aspart (NOVOLOG MIX 70/30) (70-30) 100 UNIT/ML injection Inject 25-30 Units into the skin 2 (two) times daily.     Elmore Guise Devices (ACCU-CHEK SOFTCLIX) lancets Check sugar three times daily DX E11.9 100 each 3  . magnesium oxide (MAG-OX) 400 (241.3 Mg) MG tablet Take 1 tablet (400 mg total) by mouth daily. 90 tablet 3  . meclizine (ANTIVERT) 25 MG tablet Take 1 tablet (25 mg total) by mouth  3 (three) times daily as needed for dizziness. 90 tablet 3  . Melatonin 10 MG TABS Take 1 tablet by mouth at bedtime. 30 tablet 11  . metFORMIN (GLUCOPHAGE) 1000 MG tablet TAKE 1 TABLET TWICE DAILY 180 tablet 3  . mometasone (ELOCON) 0.1 % cream Apply once daily for 1 week at a time. 50 g 2  . nitroGLYCERIN (NITROSTAT) 0.4 MG SL tablet Place 1 tablet (0.4 mg total) under the tongue every 5 (five) minutes as needed for chest pain. 25 tablet 0  . oxybutynin (DITROPAN-XL) 5 MG 24 hr tablet Take 1 tablet (5 mg total) by mouth daily. 90 tablet 3  . pantoprazole (PROTONIX) 40 MG tablet Take 1 tablet (40 mg total) by mouth daily. 90 tablet 3   No current facility-administered medications for this visit.     Review of Systems Review of Systems  Constitutional: Negative.   Respiratory: Negative.   Cardiovascular: Negative.     Blood pressure (!) 180/84, pulse 84, resp. rate 16, height 5' 3"  (1.6 m), weight 276 lb (125.2 kg).  Physical Exam Physical Exam  Constitutional: She is oriented to person, place, and time. She appears well-developed and well-nourished.  Cardiovascular: Normal rate, regular rhythm and normal heart sounds.   Pulses:      Dorsalis pedis pulses are 2+ on the right side, and 1+ on the left side.       Posterior tibial pulses are 1+ on the right side, and 2+ on the left side.  Chronic lower extremity skin changes of venous insufficiency. No active ulcers.  Musculoskeletal:       Feet:  Neurological: She is alert and oriented to person, place, and time.  Skin: Skin is warm and dry.    Data Reviewed 11/17/2015 plain films of the foot reported degenerative changes without bony destruction of the third toe. Films not available for review.  Assessment    Callus of the lateral aspect of the left third toe, possibly related to underlying bone.    Plan    Propria padding to remove pressure on the area is recommended. Ongoing follow-up with her podiatrist is encouraged.  No urgent need for amputation.    Patient to return as needed. This information has been scribed by Gaspar Cola CMA.  Robert Bellow 04/13/2016, 9:01 PM

## 2016-04-15 IMAGING — CR DG CHEST 2V
1 series · 2 of 2 positions shown · non-contrast
Comparison: Plain film 01/17/2014, 09/29/2011, prior CT abdomen
06/10/2014.

CLINICAL DATA: 50-year-old female with a history of chest pain at
[DATE] a.m.. No history of cough.

EXAM:
CHEST - 2 VIEW

[Series 1: dxr chest pa (or ap) and lateral · 0.14mm/px · 2 of 2 slices shown]
[im 1/2]
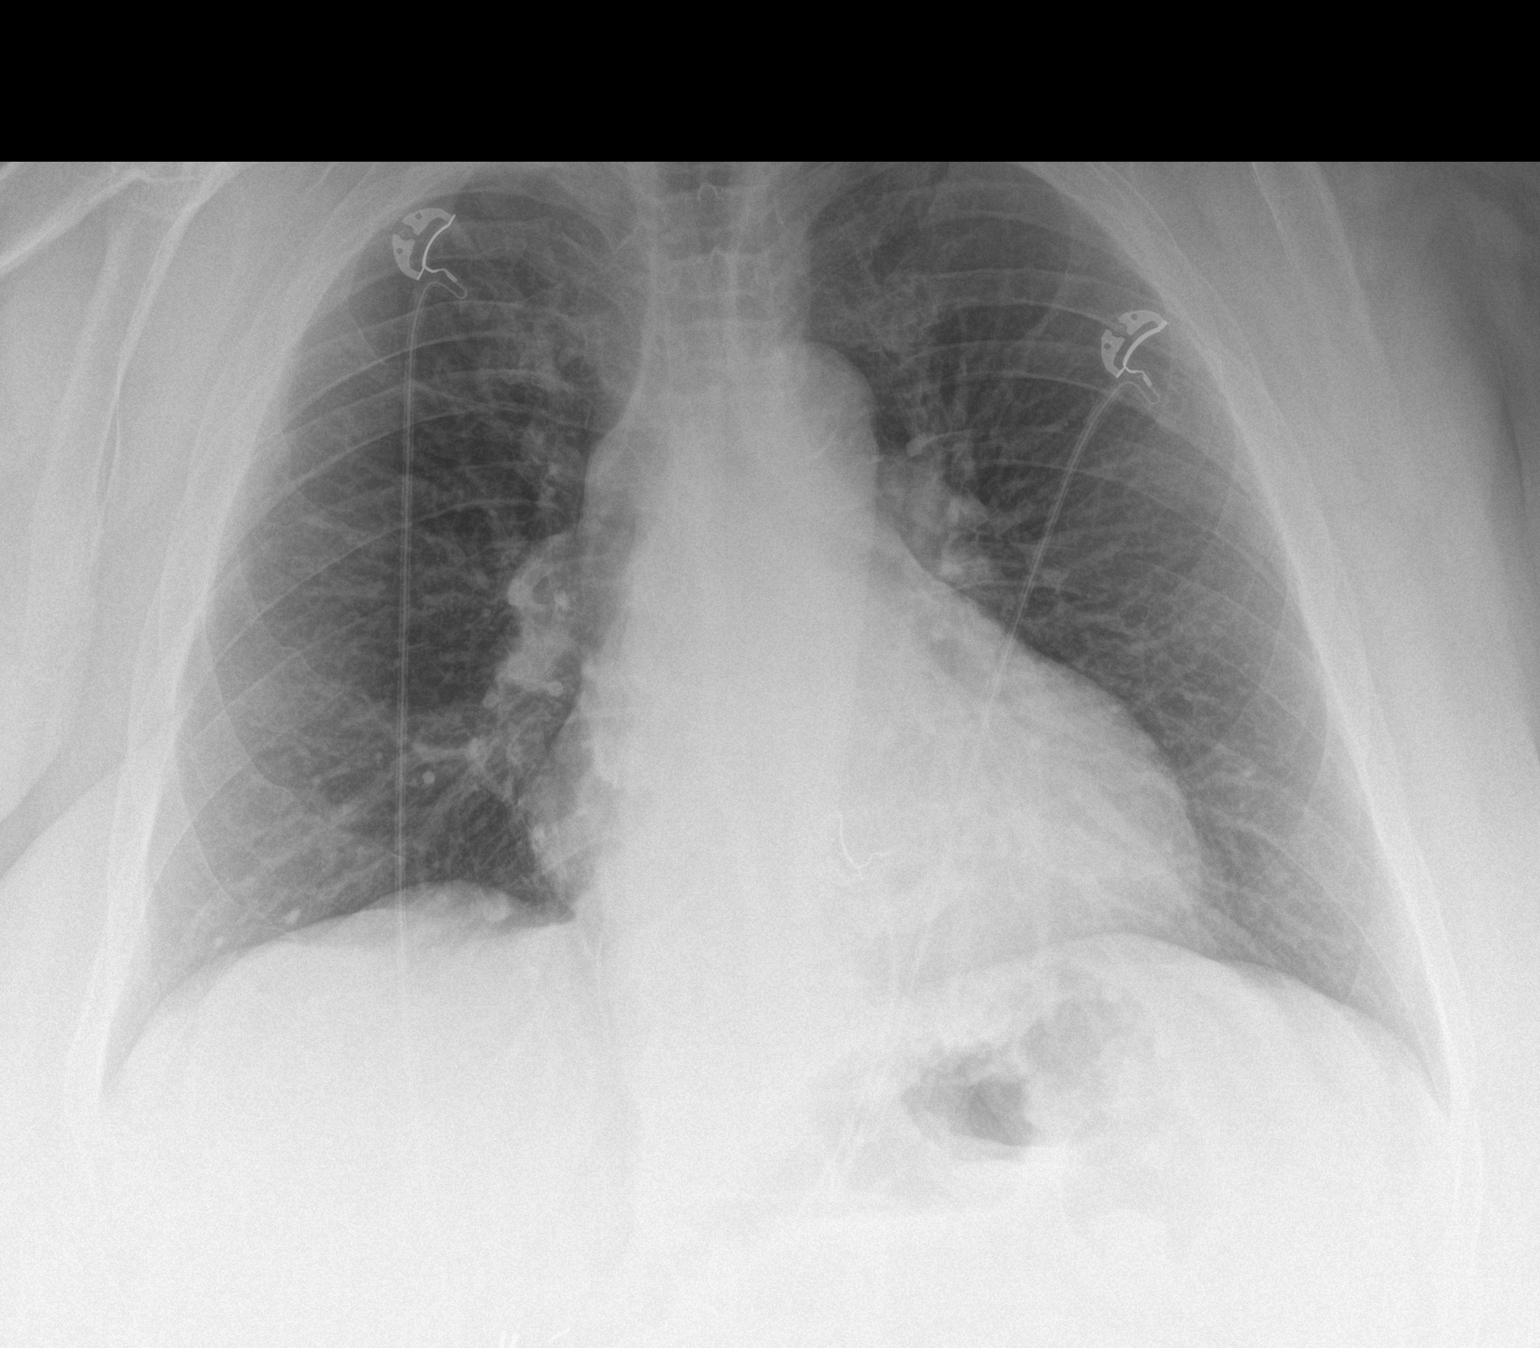
[im 2/2]
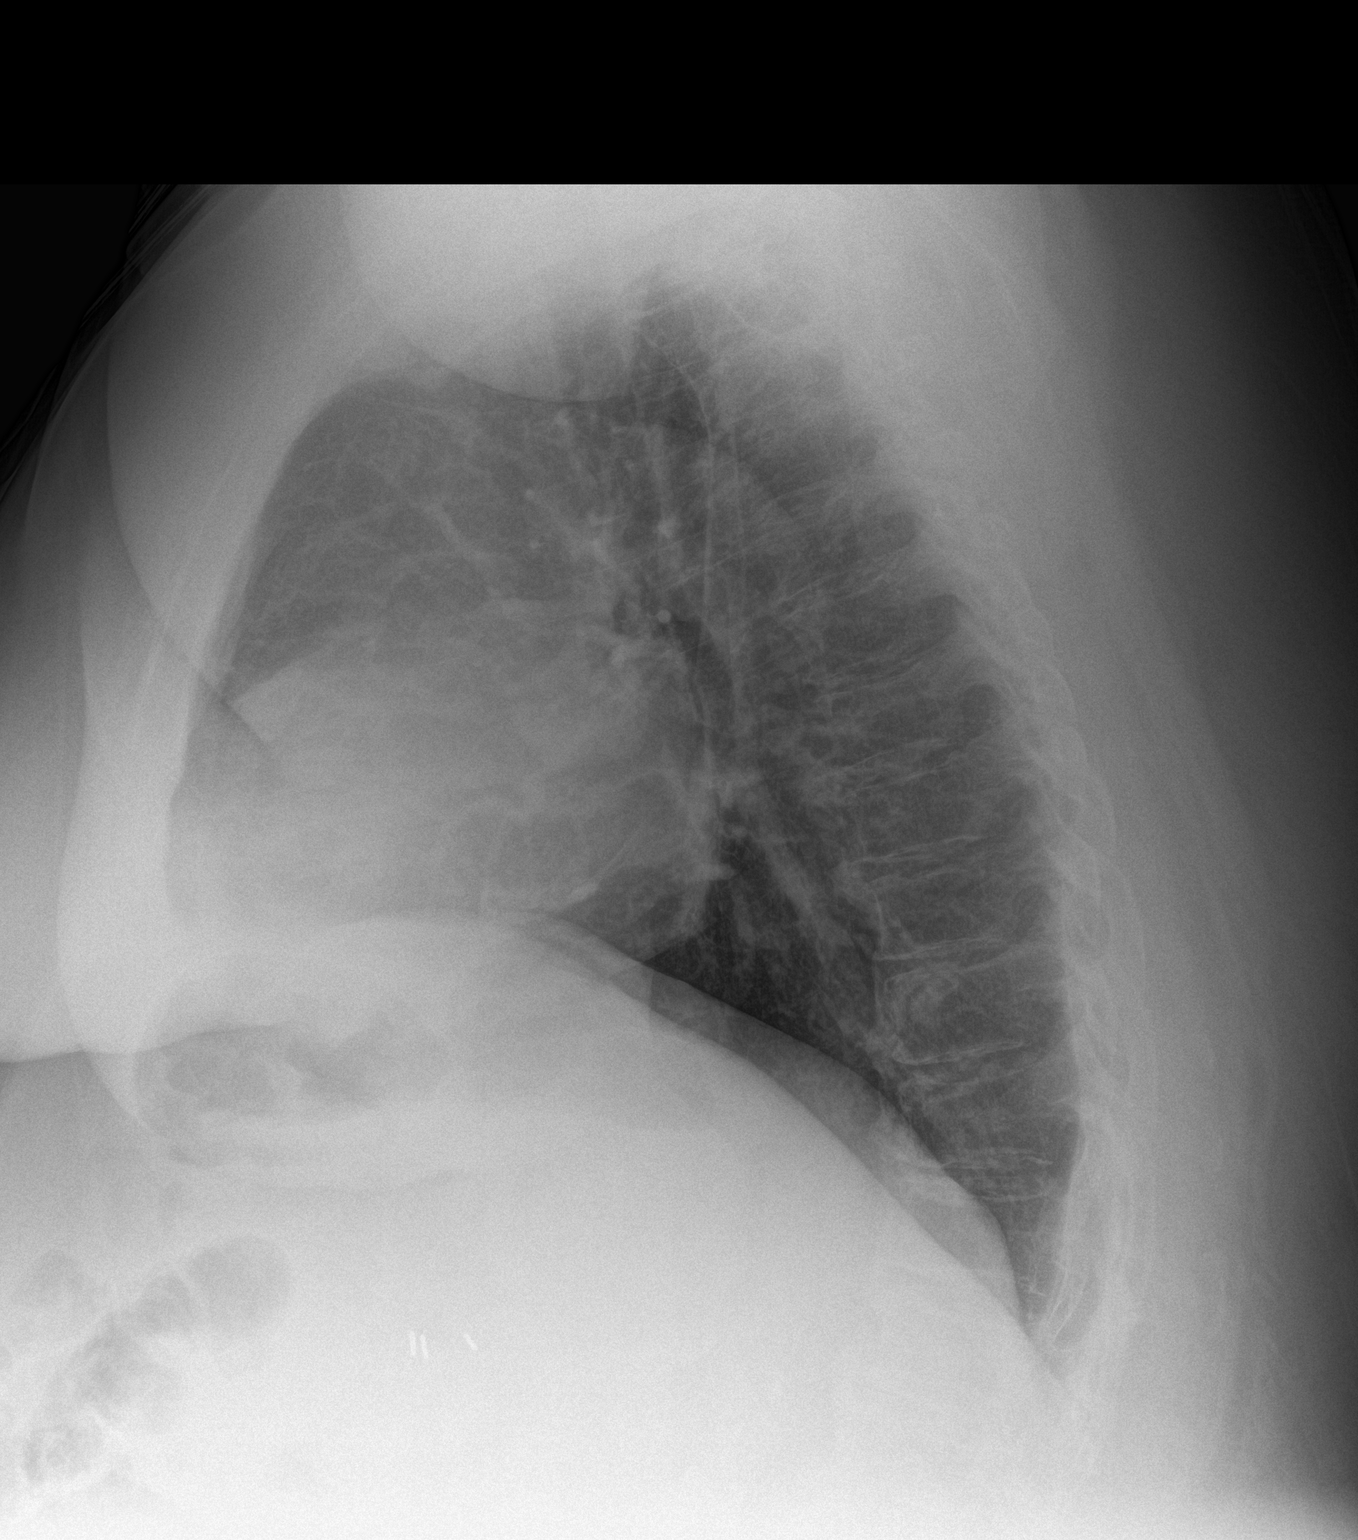

[2 of 2 positions shown; findings below may reference images not displayed]

FINDINGS: Cardiomediastinal silhouette is unchanged in size and contour from
comparison studies. No evidence of pulmonary vascular congestion.
Atherosclerotic calcifications of the aortic arch.

Ring of calcifications overlying the cardiac silhouette, compatible
with dense mitral annular calcifications present on comparison CT.

Lung volumes are low accentuating the interstitium. No confluent
airspace disease, pleural effusion, or pneumothorax. No interlobular
septal thickening. Central vasculature maintained sharp margin.

Previously questionable opacity of the right lung favored to
represent the rib, as is not currently visualized with differing
projection.

No displaced fracture identified. Multilevel degenerative disc
disease of the visualized spine with anterior osteophyte production.

Posttraumatic changes of right clavicle.

Unremarkable appearance of the upper abdomen.
IMPRESSION: No radiographic evidence of acute cardiopulmonary disease.

Changes of atherosclerosis.

Mitral annular calcifications. If not already completed, the patient
may be referred for elective workup of potential valve dysfunction.

## 2016-04-21 ENCOUNTER — Other Ambulatory Visit: Payer: Self-pay

## 2016-04-21 ENCOUNTER — Ambulatory Visit (INDEPENDENT_AMBULATORY_CARE_PROVIDER_SITE_OTHER): Payer: Commercial Managed Care - HMO

## 2016-04-21 DIAGNOSIS — Z23 Encounter for immunization: Secondary | ICD-10-CM | POA: Diagnosis not present

## 2016-04-21 NOTE — Patient Outreach (Signed)
[facility]) Care Management  04/21/2016  Mandy Peeks August 23, 1955 [id number]  Telephonic Screening  Referral Date:  04/06/16 Source:  Sonda Rumble hand off of care 04/12/16.  Issue:  Self Referral Emmi Prevent; DM type 2, HBP, Stroke with left sided weakness. DME:  Cane, walker, W/C Falls: none Meds:  About 15 Primary MD:  Last appt 04/06/16 Caregiver:  Husband ED Visits:  07/23/15, 12/28/15 Admissions: 07/22/15, 12/22/15, 12/23/15, 12/26/15, 01/28/16 A1C: 8.8  H/o ulcer on toe, Obesity, PAD,  Hypothyroidism, Diabetic retinopathy,   Outreach call #1 to patient.  Patient not reached.   RN CM left HIPAA compliant voice message with name and contact #.  RN CM scheduled for next outreach call within one week.    Nathaneil Canary, BSN, RN, [place] [facility] Care Management Coordinator [phone] Direct [phone] Cell [phone] Office [phone] Fax [email or web address]

## 2016-04-24 ENCOUNTER — Other Ambulatory Visit: Payer: Self-pay | Admitting: Family Medicine

## 2016-04-24 ENCOUNTER — Ambulatory Visit: Payer: Self-pay

## 2016-04-25 ENCOUNTER — Other Ambulatory Visit: Payer: Self-pay

## 2016-04-25 NOTE — Patient Outreach (Signed)
[facility]) Care Management  04/25/2016  Susan House 06-08-1956 [id number]   Telephonic Screening  Referral Date:  04/06/16 Source:  Sonda Rumble hand off of care 04/12/16.  Issue:  Self Referral Emmi Prevent; DM type 2, HBP, Stroke with left sided weakness.  H/o Emmi Prevent Screen Assessment: DME:  Cane, walker, W/C Falls: none Meds:  About 15 Primary MD:  Last appt 04/06/16 Caregiver:  Husband ED Visits:  07/23/15, 12/28/15 Admissions: 07/22/15, 12/22/15, 12/23/15, 12/26/15, 01/28/16 A1C: 8.8  H/o ulcer on toe, Obesity, PAD,  Hypothyroidism, Diabetic retinopathy,   Outreach call #2 to patient.  Patient not reached.   RN CM unable to leave voice message; "memory full" and would not accept message.  RN CM scheduled for next outreach call within one week.  Nathaneil Canary, BSN, RN, [place] Care Management Care Management Coordinator [phone] Direct [phone] Cell [phone] Office [phone] Fax [email or web address]

## 2016-04-26 ENCOUNTER — Other Ambulatory Visit: Payer: Self-pay

## 2016-04-26 ENCOUNTER — Encounter: Payer: Self-pay | Admitting: Family Medicine

## 2016-04-26 NOTE — Patient Outreach (Addendum)
[facility]) Care Management  04/26/2016  Susan House 20-Apr-1956 [id number]   Telephonic Screening  Referral Date: 04/06/16 Source: Sonda Rumble hand off of care 04/12/16.  Issue: Self Referral Emmi Prevent; DM type 2, HBP, Stroke with left sided weakness. Insurance:  [employer] - states looking at another policy that will pick up the secondary coverage.  Specialist  $45.00 Primary MD $10.00 - States she is having to pay more since Dr. Rosanna Randy joined the [facility] group but is not able to explain the specifics of the details of charges.  Subjective: Identifies her biggest health concern as her ability to walk and manage her Blood sugar readings.   Providers: Primary MD:    Dr. Rosanna Randy - Last appt 04/06/16  / next appt: end of November Surgeon:  Dr. Tollie Pizza - last appt 04/13/16 Podiatrist: Dr. Jens Som, [facility]   HH: none   Social: Patient lives in her home with her husband Mobility:  Ability to walk  Left side weakness from stroke. 2000 Falls: (1) over the past year -  last fall 04/24/16 while bending over picking up something and lost balance and fell to floor requiring 911 to assist with getting up out of the floor.  Refused ED transport.  Bruise Left shoulder about the size of an orange and  generalized pain back, legs and left arm more than usual.   Tries to walk some daily.  Pain: back injury, back surgery, degenerative disk disease.   Depression: none  Transportation: husband  Caregiver: husband  Emergency Contact: husband  Advance Directive: yes, Dr. Rosanna Randy does not have a copy.  Consent:  yes DME:  Cane, walker, W/C manual and electric, scales, CBG meter and supplies, BP cuff (manual and digital), Upper complete denture and lower partial plate.   Co-morbidities:  DM type 2, HBP, Stroke with left sided weakness, H/o ulcer on toe, Obesity, PAD, Hypothyroidism, Diabetic retinopathy, Gout ED Visits: 07/23/15,  12/28/15 Admissions: 07/22/15, 12/22/15, 12/23/15, 12/26/15, 01/28/16  DM A1C: 8.2 Blood sugars running from 35 - 400  States history of frequent UTI's.  Weight 276 lbs Height 62 inches  BMI 49 Monitors weight and BP only when she thinks she is having a problem but going to start because she just started a weight loss program today:   Piyo Diet Plan by Eustace Pen (workouts for the body which includes a diet plan).   Left side of the third toe ulceration since about (08/2015) Podiatrist:  Dr. Jens Som, [facility], [place] has recommended surgical removal of toe.  Patient followed up with Surgeon, Dr Tollie Pizza last week 04/2016 who supported patient's decision not to remove the toe at this time.  Patient describes wound as about 1 cm size edge to edge  with opening of 0.3 cm.   Currently applying Neosporin and keeping covered and using a toe spacer to avoid pressure.    Medications:  Patient taking about 15 medications  Co-pay cost issues:  None; generics are not an issue.  Brands vary from $15 - $80 Prevnar (PCV13) Va N/D PPSV23 05/09/2006 Flu Vaccine 04/23/2014 tDAP Vaccine 04/27/2011  Encounter Medications:  Outpatient Encounter Prescriptions as of 04/26/2016  Medication Sig  . ACCU-CHEK AVIVA PLUS test strip TEST BLOOD SUGAR THREE TIMES DAILY  . ACCU-CHEK SOFTCLIX LANCETS lancets CHECK BLOOD SUGAR THREE TIMES DAILY  . amLODipine (NORVASC) 10 MG tablet Take 1 tablet (10 mg total) by mouth daily.  Marland Kitchen aspirin 325 MG tablet Take  325 mg by mouth daily.  Marland Kitchen atorvastatin (LIPITOR) 20 MG tablet Take 1 tablet (20 mg total) by mouth at bedtime.  . Blood Glucose Monitoring Suppl (ACCU-CHEK AVIVA PLUS) w/Device KIT Check sugar three times daily DX E11.9-needs a meter  . clopidogrel (PLAVIX) 75 MG tablet TAKE 1 TABLET EVERY DAY  . Cranberry 1000 MG CAPS Take 2,000 mg by mouth daily.   . enalapril (VASOTEC) 20 MG tablet TAKE 1 TABLET TWICE DAILY  . hydrochlorothiazide  (HYDRODIURIL) 25 MG tablet TAKE 1 TABLET EVERY DAY  . HYDROcodone-acetaminophen (NORCO) 10-325 MG tablet Take 1-2 tablets by mouth every 4 (four) hours as needed.  . insulin aspart protamine- aspart (NOVOLOG MIX 70/30) (70-30) 100 UNIT/ML injection Inject 25-30 Units into the skin 2 (two) times daily.   Elmore Guise Devices (ACCU-CHEK SOFTCLIX) lancets Check sugar three times daily DX E11.9  . magnesium oxide (MAG-OX) 400 (241.3 Mg) MG tablet Take 1 tablet (400 mg total) by mouth daily.  . meclizine (ANTIVERT) 25 MG tablet Take 1 tablet (25 mg total) by mouth 3 (three) times daily as needed for dizziness.  . Melatonin 10 MG TABS Take 1 tablet by mouth at bedtime.  . metFORMIN (GLUCOPHAGE) 1000 MG tablet TAKE 1 TABLET TWICE DAILY  . mometasone (ELOCON) 0.1 % cream Apply once daily for 1 week at a time.  . nitroGLYCERIN (NITROSTAT) 0.4 MG SL tablet Place 1 tablet (0.4 mg total) under the tongue every 5 (five) minutes as needed for chest pain.  Marland Kitchen oxybutynin (DITROPAN-XL) 5 MG 24 hr tablet Take 1 tablet (5 mg total) by mouth daily.  . pantoprazole (PROTONIX) 40 MG tablet Take 1 tablet (40 mg total) by mouth daily.   No facility-administered encounter medications on file as of 04/26/2016.     Functional Status:  In your present state of health, do you have any difficulty performing the following activities: 04/06/2016 12/26/2015  Hearing? N N  Vision? N N  Difficulty concentrating or making decisions? N N  Walking or climbing stairs? Y Y  Dressing or bathing? Y Y  Doing errands, shopping? Tempie Donning  Some recent data might be hidden    Fall/Depression Screening: PHQ 2/9 Scores 09/08/2015 03/11/2015  PHQ - 2 Score 0 0    Preventives: Hearing: no issues, screened years ago.  Eyes: 04/20/16 and every 6 months - Dr. Leandrew Koyanagi [facility]  -H/o Bilateral cataract surgery, laser treatments years ago  Dentist: Dr. Delfino Lovett Daily - every 6 months - 1 year but has been 2 years this time.  Next  appt 04/2016  Upper complete denture and lower partial plate.  Podiatrist:  Mammogram 03/26/2014 Bone Density: Colonoscopy: 01/02/2007  Assessment / Plan:  Referral Date:  04/12/17 Screening 04/26/2016  [facility] Community RN CM Referral 04/26/2016 -Diabetes - uncontrolled -non-healing would relating to complications of diabetes.  -A1C 8.2 -BS running 35-400s -Recent Fall  (Patient states she will be out of town starting Tuesday 05/02/16 - 05/07/2016 but can be reached before or after those dates).  [place] Referral 04/26/2016 -Medication review:  A1C 8.2 and BS running 35-400s  RN CM advised patient in next [facility] scheduled contact call within the next 10 business days for [place] contact.   RN CM advised to please notify MD of any changes in condition prior to scheduled appt's.   RN CM provided contact name and # [phone] or main office # [phone] and 24-hour nurse line # [phone].  RN CM confirmed patient is aware  of 911 services for urgent emergency needs.  Nathaneil Canary, BSN, RN, [place] Management Care Management Coordinator [phone] Direct [phone] Cell [phone] Office [phone] Fax [email or web address] [email or web address]

## 2016-04-27 ENCOUNTER — Other Ambulatory Visit: Payer: Self-pay | Admitting: *Deleted

## 2016-04-27 NOTE — Patient Outreach (Signed)
Attempt made to contact pt, follow up on referral received from [place] telephonic RN CM 10/12 for community nurse case management services.   Unable to leave message on pt's home phone as phone kept ringing.  Attempt made to contact pt on mobile phone number provided in Epic, person answering the phone reports wrong number.      Plan:  RN CM to call pt again within 1 day.      Zara Chess.   [place] Care Management  [phone]

## 2016-04-27 NOTE — Patient Outreach (Signed)
Second attempt made to contact pt, follow up on referral from [place] 10/12 for community nurse case management services.  Unable to leave a voice message on pt's home phone as phone kept ringing.     Plan:  With pt being out of town 10/17-10/22 as  related to [facility] telephonic RN CM, this RN CM will follow up after pt returns, discuss scheduling home visit.      Zara Chess.   [place] Care Management  [phone]

## 2016-05-08 ENCOUNTER — Other Ambulatory Visit: Payer: Self-pay | Admitting: *Deleted

## 2016-05-08 NOTE — Patient Outreach (Addendum)
Follow up phone call successful (2 phone attempts made prior).   Spoke with pt, HIPAA verified, discussed purpose of call to follow up on referral received (10/12) from coworker [place] for community nurse case management services.   This RN CM was  informed by telephonic RN CM pt  will be out of town 10/17-10/22.   RN CM informed pt with phone call attempts, found her mobile number not to be  correct to which pt provided correct mobile number [phone]).   Pt reports does not think she needs a nurse but was willing to have this RN CM do a home visit.     Plan: As discussed with pt, plan to follow up again this week- home visit to assess needs.  RN CM's contact name and number provided to pt.     Zara Chess.   [place] Care Management  [phone]

## 2016-05-10 ENCOUNTER — Encounter: Payer: Self-pay | Admitting: General Surgery

## 2016-05-10 ENCOUNTER — Other Ambulatory Visit: Payer: Self-pay | Admitting: *Deleted

## 2016-05-10 ENCOUNTER — Encounter: Payer: Self-pay | Admitting: *Deleted

## 2016-05-10 NOTE — Patient Outreach (Addendum)
[facility] [facility]) Care Management   05/10/2016  Susan House June 22, 1956 [id number]  Susan House is an 60 y.o. female  Subjective: Pt reports dealing with a cold that started 10/23, attempt made to cancel home visit. Pt reports she has no fever, no productive cough,little sob sitting, feels like congestion in her  Lungs.   Pt reports she has an appointment to see Dr. Rosanna House tomorrow.   Pt reports has a hx of  UTI's, had an old prescription of Cipro 43 mg (60 years old) which she started taking 10/23 for her  Cold, not helping.  Pt reports she was having symptoms of UTI- burning, increased frequency and  That has improved.  Pt reports she did not eat or take her insulin this am, sugar 103, staying Hydrated.   Pt reports her energy level has been down for a year, MD suggested outpatient rehab, declined, not up to it at this time.  Pt reports hx of stroke in 2000, leaving her with left  sided weakness.  Pt reports on follow up with Dr. Tollie House (surgeon) about ulceration on left third toe as Dr. Cleda House suggested amputation.   Pt reports does not want amputation, monitoring  Daily, having no drainage, pain.     Objective:   Vitals:   05/10/16 1348  BP: (!) 154/62  Pulse: 80  Resp: 20    ROS  Physical Exam  Constitutional: She is oriented to person, place, and time. She appears well-developed and well-nourished.  Cardiovascular: Normal rate, regular rhythm and normal heart sounds.   Respiratory: Effort normal and breath sounds normal.  GI: Soft.  Musculoskeletal: She exhibits edema.  Trace edema noted left ankle/foot (top).  Cane/walker needed for ambulation.     Neurological: She is alert and oriented to person, place, and time.  Skin: Skin is warm and dry.  Third left toe- side facing fourth toe appears as a callous, no signs and symptoms of infection noted.    Psychiatric: She has a normal mood and affect. Her behavior is normal. Judgment and thought content  normal.    Encounter Medications:   Outpatient Encounter Prescriptions as of 05/10/2016  Medication Sig Note  . ACCU-CHEK AVIVA PLUS test strip TEST BLOOD SUGAR THREE TIMES DAILY   . ACCU-CHEK SOFTCLIX LANCETS lancets CHECK BLOOD SUGAR THREE TIMES DAILY   . amLODipine (NORVASC) 10 MG tablet Take 1 tablet (10 mg total) by mouth daily.   Marland Kitchen aspirin 325 MG tablet Take 325 mg by mouth daily.   Marland Kitchen atorvastatin (LIPITOR) 20 MG tablet Take 1 tablet (20 mg total) by mouth at bedtime.   . baclofen (LIORESAL) 20 MG tablet Take 20 mg by mouth 4 (four) times daily.   . Blood Glucose Monitoring Suppl (ACCU-CHEK AVIVA PLUS) w/Device KIT Check sugar three times daily DX E11.9-needs a meter   . clopidogrel (PLAVIX) 75 MG tablet TAKE 1 TABLET EVERY DAY   . Cranberry 1000 MG CAPS Take 2,000 mg by mouth daily.  05/10/2016: Per pt- taking 2 tablets 450 mg daily   . enalapril (VASOTEC) 20 MG tablet TAKE 1 TABLET TWICE DAILY   . gabapentin (NEURONTIN) 100 MG capsule Take 100 mg by mouth 3 (three) times daily.   . hydrochlorothiazide (HYDRODIURIL) 25 MG tablet TAKE 1 TABLET EVERY DAY   . HYDROcodone-acetaminophen (NORCO) 10-325 MG tablet Take 1-2 tablets by mouth every 4 (four) hours as needed.   . insulin aspart protamine- aspart (NOVOLOG MIX 70/30) (70-30) 100 UNIT/ML injection  Inject 25-30 Units into the skin 2 (two) times daily.    Elmore Guise Devices (ACCU-CHEK SOFTCLIX) lancets Check sugar three times daily DX E11.9   . magnesium oxide (MAG-OX) 400 (241.3 Mg) MG tablet Take 1 tablet (400 mg total) by mouth daily.   . meclizine (ANTIVERT) 25 MG tablet Take 1 tablet (25 mg total) by mouth 3 (three) times daily as needed for dizziness. 05/10/2016: As needed.   . Melatonin 10 MG TABS Take 1 tablet by mouth at bedtime. 05/10/2016: As needed.   . metFORMIN (GLUCOPHAGE) 1000 MG tablet TAKE 1 TABLET TWICE DAILY   . mometasone (ELOCON) 0.1 % cream Apply once daily for 1 week at a time. 05/10/2016: As needed  . oxybutynin  (DITROPAN-XL) 5 MG 24 hr tablet Take 1 tablet (5 mg total) by mouth daily.   . nitroGLYCERIN (NITROSTAT) 0.4 MG SL tablet Place 1 tablet (0.4 mg total) under the tongue every 5 (five) minutes as needed for chest pain. (Patient not taking: Reported on 05/10/2016) 05/10/2016: Have available if needed.   . pantoprazole (PROTONIX) 40 MG tablet Take 1 tablet (40 mg total) by mouth daily. (Patient not taking: Reported on 05/10/2016) 05/10/2016: Pt out of it, pt to call pharmacy    No facility-administered encounter medications on file as of 05/10/2016.     Functional Status:   In your present state of health, do you have any difficulty performing the following activities: 04/26/2016 04/06/2016  Hearing? N N  Vision? N N  Difficulty concentrating or making decisions? N N  Walking or climbing stairs? Y Y  Dressing or bathing? Y Y  Doing errands, shopping? Susan House  Preparing Food and eating ? N -  Using the Toilet? N -  In the past six months, have you accidently leaked urine? N -  Do you have problems with loss of bowel control? N -  Managing your Medications? N -  Managing your Finances? N -  Housekeeping or managing your Housekeeping? Y -  Some recent data might be hidden    Fall/Depression Screening:    PHQ 2/9 Scores 04/26/2016 09/08/2015 03/11/2015  PHQ - 2 Score 0 0 0    Assessment:  Pleasant 60 year old woman, resides with spouse, sitting in recliner upon  Arrival, reported dealing with a cough.   Lungs clear.  Trace edema left ankle/foot.  Per pt,  been taking Cipro 500 mg bid (view of prescription- expired >2 years ago), instructed pt to stop.    DM- view of pt's glucometer sugars today 103, ranges per pt and glucometer 45- 312. Information            On Basic carb counting, sick day rules.    HTN-  BP today 154/62.  Information on Low Na+ diet provided/reviewed - foods to avoid, reading               Food labels.     Skin integrity- pt to continue to monitor left third toe, follow up  with Dr. Cleda House.              Plan:  As discussed, pt to stop taking Cipro (medication expired), follow up with Dr. Rosanna House               Tomorrow (assess cough).             DM- pt to continue to check sugars, observe sick day rules, include protein with each meal/  Snack.              Plan to inform Dr. Rosanna House of [facility] involvement- send barrier letter as well as 10/25 home                Visit encounter.              Plan to continue to follow pt with community nurse case management services, follow up              Again next month- home visit.                  [facility] CM Care Plan Problem One   Flowsheet Row Most Recent Value  Care Plan Problem One  Flunctuation in blood sugars- several high readings   Role Documenting the Problem One  Care Management [place] for Problem One  Active  [facility] Long Term Goal (31-90 days)  Pt would see a decrease in her sugars in the next 40 days   [facility] Long Term Goal Start Date  05/10/16  Interventions for Problem One Long Term Goal  Initial home visit done, information on Basic carb counting provided, reviewed sick days for diabetics.   [facility] CM Short Term Goal #1 (0-30 days)  Pt would have protein with all of her meals/snacks within the next 30 days   [facility] CM Short Term Goal #1 Start Date  05/10/16  Interventions for Short Term Goal #1  Discussed with pt importance of having protein with all her meals/snacks- assist with keeping sugars stable     [facility] CM Care Plan Problem Two   Flowsheet Row Most Recent Value  Care Plan Problem Two  Decreased energy level   Role Documenting the Problem Two  Care Management [place] for Problem Two  Active  [facility] CM Short Term Goal #1 (0-30 days)  Pt would see energy level increase in the next 30 days   [facility] CM Short Term Goal #1 Start Date  05/10/16  Interventions for Short Term Goal #2   Discussed with pt doing follow up exercises provided by [facility] PT, pt declined outpatient rehab at this  time.       Zara Chess.   [place] Management  [phone]   Addendum: to correct spelling of  Dr. Cleda House.     Zara Chess.   [place] Care Management  [phone]

## 2016-05-11 ENCOUNTER — Encounter: Payer: Self-pay | Admitting: *Deleted

## 2016-05-11 ENCOUNTER — Ambulatory Visit (INDEPENDENT_AMBULATORY_CARE_PROVIDER_SITE_OTHER): Payer: Commercial Managed Care - HMO | Admitting: Family Medicine

## 2016-05-11 VITALS — BP 102/64 | HR 94 | Temp 97.6°F | Resp 16

## 2016-05-11 DIAGNOSIS — N183 Chronic kidney disease, stage 3 unspecified: Secondary | ICD-10-CM

## 2016-05-11 DIAGNOSIS — E871 Hypo-osmolality and hyponatremia: Secondary | ICD-10-CM | POA: Diagnosis not present

## 2016-05-11 DIAGNOSIS — M545 Low back pain: Secondary | ICD-10-CM | POA: Diagnosis not present

## 2016-05-11 DIAGNOSIS — E1122 Type 2 diabetes mellitus with diabetic chronic kidney disease: Secondary | ICD-10-CM

## 2016-05-11 DIAGNOSIS — G8929 Other chronic pain: Secondary | ICD-10-CM

## 2016-05-11 DIAGNOSIS — I10 Essential (primary) hypertension: Secondary | ICD-10-CM

## 2016-05-11 DIAGNOSIS — J0110 Acute frontal sinusitis, unspecified: Secondary | ICD-10-CM | POA: Diagnosis not present

## 2016-05-11 MED ORDER — AMOXICILLIN-POT CLAVULANATE 875-125 MG PO TABS: 1.0000 | ORAL_TABLET | Freq: Two times a day (BID) | ORAL | 0 refills | Status: DC
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
## 2016-05-12 ENCOUNTER — Other Ambulatory Visit: Payer: Self-pay | Admitting: *Deleted

## 2016-05-12 NOTE — Patient Outreach (Signed)
Error entry.   [place] M.   Kylen Ismael RN CCM [facility]  [phone]  

## 2016-05-16 ENCOUNTER — Ambulatory Visit
Admission: RE | Admit: 2016-05-16 | Discharge: 2016-05-16 | Disposition: A | Discharge status: Home / Self Care | Payer: Commercial Managed Care - HMO | Source: Ambulatory Visit | Attending: Physician Assistant | Admitting: Physician Assistant

## 2016-05-16 ENCOUNTER — Ambulatory Visit (INDEPENDENT_AMBULATORY_CARE_PROVIDER_SITE_OTHER): Payer: Commercial Managed Care - HMO | Admitting: Physician Assistant

## 2016-05-16 ENCOUNTER — Encounter: Payer: Self-pay | Admitting: Physician Assistant

## 2016-05-16 VITALS — BP 124/64 | HR 78 | Temp 98.3°F | Resp 16

## 2016-05-16 DIAGNOSIS — M545 Low back pain, unspecified: Secondary | ICD-10-CM

## 2016-05-16 DIAGNOSIS — Z9889 Other specified postprocedural states: Secondary | ICD-10-CM | POA: Insufficient documentation

## 2016-05-16 DIAGNOSIS — M47896 Other spondylosis, lumbar region: Secondary | ICD-10-CM | POA: Insufficient documentation

## 2016-05-16 DIAGNOSIS — I700 Atherosclerosis of aorta: Secondary | ICD-10-CM | POA: Insufficient documentation

## 2016-05-16 DIAGNOSIS — G8929 Other chronic pain: Secondary | ICD-10-CM

## 2016-05-16 NOTE — Progress Notes (Signed)
Patient: Susan House Female    DOB: [birth year]   60 y.o.   MRN: [id number] Visit Date: 05/16/2016  Today's Provider: Trinna Post, PA-C   Chief Complaint  Patient presents with  . Back Pain    Acute flare on a chronic issue.    Subjective:    HPI Pt comes into complaining of worsening back pain.  She has chronic low back pain for years, but she states she hurt it last night getting into bed.  She say she felt a "pop" then felt a sharpe pain in her lower back, left hip and thigh.  She says the pain is about the same as last night.  She has taking her Norco, Flexeril and that has not helped with the pain. She also has seen [facility] for injections, which have not helped. She had an MRI there three months ago, she reports. Patient reports she has baseline leftsided weakness and incontinence from a previous stroke. No noticeable increase in numbness and tingling.  Pain is 8/10 at its worst and 6/10 just sitting there. Pain worsens with movement. Patient also has Lumbosacral orthotic that she has tried in the past without relief.     Allergies  Allergen Reactions  . Morphine And Related Anaphylaxis  . Fentanyl Other (See Comments)    Unknown reaction.  . Reglan [Metoclopramide]     Elevated BP  . Simvastatin Other (See Comments)    Muscle pain  . Betadine [Povidone Iodine] Rash  . Iodine Rash  . Tetracyclines & Related Rash     Current Outpatient Prescriptions:  .  ACCU-CHEK AVIVA PLUS test strip, TEST BLOOD SUGAR THREE TIMES DAILY, Disp: 300 each, Rfl: 3 .  ACCU-CHEK SOFTCLIX LANCETS lancets, CHECK BLOOD SUGAR THREE TIMES DAILY, Disp: 300 each, Rfl: 3 .  amLODipine (NORVASC) 10 MG tablet, Take 1 tablet (10 mg total) by mouth daily., Disp: 30 tablet, Rfl: 0 .  amoxicillin-clavulanate (AUGMENTIN) 875-125 MG tablet, Take 1 tablet by mouth 2 (two) times daily., Disp: 14 tablet, Rfl: 0 .  aspirin 325 MG tablet, Take 325 mg by mouth daily., Disp: ,  Rfl:  .  atorvastatin (LIPITOR) 20 MG tablet, Take 1 tablet (20 mg total) by mouth at bedtime., Disp: 90 tablet, Rfl: 3 .  baclofen (LIORESAL) 20 MG tablet, Take 20 mg by mouth 4 (four) times daily., Disp: , Rfl:  .  clopidogrel (PLAVIX) 75 MG tablet, TAKE 1 TABLET EVERY DAY, Disp: 90 tablet, Rfl: 3 .  Cranberry 1000 MG CAPS, Take 2,000 mg by mouth daily. , Disp: , Rfl:  .  enalapril (VASOTEC) 20 MG tablet, TAKE 1 TABLET TWICE DAILY, Disp: 180 tablet, Rfl: 3 .  gabapentin (NEURONTIN) 100 MG capsule, Take 100 mg by mouth 3 (three) times daily., Disp: , Rfl:  .  hydrochlorothiazide (HYDRODIURIL) 25 MG tablet, TAKE 1 TABLET EVERY DAY, Disp: 90 tablet, Rfl: 3 .  HYDROcodone-acetaminophen (NORCO) 10-325 MG tablet, Take 1-2 tablets by mouth every 4 (four) hours as needed., Disp: 180 tablet, Rfl: 0 .  insulin aspart protamine- aspart (NOVOLOG MIX 70/30) (70-30) 100 UNIT/ML injection, Inject 25-30 Units into the skin 2 (two) times daily. , Disp: , Rfl:  .  Lancet Devices (ACCU-CHEK SOFTCLIX) lancets, Check sugar three times daily DX E11.9, Disp: 100 each, Rfl: 3 .  magnesium oxide (MAG-OX) 400 (241.3 Mg) MG tablet, Take 1 tablet (400 mg total) by mouth daily., Disp: 90 tablet, Rfl: 3 .  meclizine (ANTIVERT) 25 MG tablet, Take 1 tablet (25 mg total) by mouth 3 (three) times daily as needed for dizziness., Disp: 90 tablet, Rfl: 3 .  Melatonin 10 MG TABS, Take 1 tablet by mouth at bedtime., Disp: 30 tablet, Rfl: 11 .  metFORMIN (GLUCOPHAGE) 1000 MG tablet, TAKE 1 TABLET TWICE DAILY, Disp: 180 tablet, Rfl: 3 .  mometasone (ELOCON) 0.1 % cream, Apply once daily for 1 week at a time., Disp: 50 g, Rfl: 2 .  nitroGLYCERIN (NITROSTAT) 0.4 MG SL tablet, Place 1 tablet (0.4 mg total) under the tongue every 5 (five) minutes as needed for chest pain., Disp: 25 tablet, Rfl: 0 .  oxybutynin (DITROPAN-XL) 5 MG 24 hr tablet, Take 1 tablet (5 mg total) by mouth daily., Disp: 90 tablet, Rfl: 3 .  pantoprazole (PROTONIX) 40  MG tablet, Take 1 tablet (40 mg total) by mouth daily., Disp: 90 tablet, Rfl: 3 .  Blood Glucose Monitoring Suppl (ACCU-CHEK AVIVA PLUS) w/Device KIT, Check sugar three times daily DX E11.9-needs a meter, Disp: 1 kit, Rfl: 0  Review of Systems  Constitutional: Negative.   Musculoskeletal: Positive for back pain. Negative for gait problem, joint swelling, myalgias, neck pain and neck stiffness.  Neurological: Negative for dizziness, light-headedness and headaches.    Social History  Substance Use Topics  . Smoking status: Never Smoker  . Smokeless tobacco: Never Used  . Alcohol use No   Objective:   BP 124/64 (BP Location: Right Arm, Patient Position: Sitting, Cuff Size: Large)   Pulse 78   Temp 98.3 F (36.8 C) (Oral)   Resp 16   Physical Exam  Constitutional: She is oriented to person, place, and time. She appears well-developed and well-nourished.  Abdominal: Soft. Bowel sounds are normal.  Musculoskeletal:       Cervical back: Normal.       Thoracic back: Normal.       Lumbar back: She exhibits pain. She exhibits no tenderness, no bony tenderness, no swelling, no edema, no deformity and no spasm.  Neurological: She is alert and oriented to person, place, and time.  Strength 4/5 left lower extremity. Strength 5/5 right lower extremity. Sensation grossly intact distal bilateral extremities. Full exam limited due to patient's mobility.   Skin: Skin is warm and dry.        Assessment & Plan:      Problem List Items Addressed This Visit    Chronic back pain - Primary   Relevant Orders   DG Lumbar Spine Complete   Ambulatory referral to Orthopedics    Other Visit Diagnoses   None.    Patient is 60 y/o presenting with acute on chronic back pain. Patient has tried multiple interventions including muscle relaxers, Hydrocodone 10/325, and injections from [facility]. Told patient in absence of relief from these interventions, not many other options to be  tried in context of primary care. Will refer to Dr. Sharlet Salina at physiatry to see if he has further interventions. Suggested retrying lumbosacral orthotic to see if this helps. Will get plain film today to evaluate for any acute malalignment.  No Follow-up on file.  The entirety of the information documented in the History of Present Illness, Review of Systems and Physical Exam were personally obtained by me. Portions of this information were initially documented by Ashley Royalty, CMA and reviewed by me for thoroughness and accuracy.     Patient Instructions  Back Pain, Adult Back pain is very common in adults.The cause of  back pain is rarely dangerous and the pain often gets better over time.The cause of your back pain may not be known. Some common causes of back pain include:  Strain of the muscles or ligaments supporting the spine.  Wear and tear (degeneration) of the spinal disks.  Arthritis.  Direct injury to the back. For many people, back pain may return. Since back pain is rarely dangerous, most people can learn to manage this condition on their own. HOME CARE INSTRUCTIONS Watch your back pain for any changes. The following actions may help to lessen any discomfort you are feeling:  Remain active. It is stressful on your back to sit or stand in one place for long periods of time. Do not sit, drive, or stand in one place for more than 30 minutes at a time. Take short walks on even surfaces as soon as you are able.Try to increase the length of time you walk each day.  Exercise regularly as directed by your health care provider. Exercise helps your back heal faster. It also helps avoid future injury by keeping your muscles strong and flexible.  Do not stay in bed.Resting more than 1-2 days can delay your recovery.  Pay attention to your body when you bend and lift. The most comfortable positions are those that put less stress on your recovering back. Always use proper lifting  techniques, including:  Bending your knees.  Keeping the load close to your body.  Avoiding twisting.  Find a comfortable position to sleep. Use a firm mattress and lie on your side with your knees slightly bent. If you lie on your back, put a pillow under your knees.  Avoid feeling anxious or stressed.Stress increases muscle tension and can worsen back pain.It is important to recognize when you are anxious or stressed and learn ways to manage it, such as with exercise.  Take medicines only as directed by your health care provider. Over-the-counter medicines to reduce pain and inflammation are often the most helpful.Your health care provider may prescribe muscle relaxant drugs.These medicines help dull your pain so you can more quickly return to your normal activities and healthy exercise.  Apply ice to the injured area:  Put ice in a plastic bag.  Place a towel between your skin and the bag.  Leave the ice on for 20 minutes, 2-3 times a day for the first 2-3 days. After that, ice and heat may be alternated to reduce pain and spasms.  Maintain a healthy weight. Excess weight puts extra stress on your back and makes it difficult to maintain good posture. SEEK MEDICAL CARE IF:  You have pain that is not relieved with rest or medicine.  You have increasing pain going down into the legs or buttocks.  You have pain that does not improve in one week.  You have night pain.  You lose weight.  You have a fever or chills. SEEK IMMEDIATE MEDICAL CARE IF:   You develop new bowel or bladder control problems.  You have unusual weakness or numbness in your arms or legs.  You develop nausea or vomiting.  You develop abdominal pain.  You feel faint.   This information is not intended to replace advice given to you by your health care provider. Make sure you discuss any questions you have with your health care provider.   Document Released: 07/03/2005 Document Revised: 07/24/2014  Document Reviewed: 11/04/2013 Elsevier Interactive Patient Education 2016 [place]  Iva Lento, PA-C  [facility]

## 2016-05-16 NOTE — Patient Instructions (Signed)

## 2016-05-17 ENCOUNTER — Ambulatory Visit (INDEPENDENT_AMBULATORY_CARE_PROVIDER_SITE_OTHER): Payer: Commercial Managed Care - HMO | Admitting: Urology

## 2016-05-17 ENCOUNTER — Telehealth: Payer: Self-pay

## 2016-05-17 ENCOUNTER — Encounter: Payer: Self-pay | Admitting: Urology

## 2016-05-17 ENCOUNTER — Telehealth: Payer: Self-pay | Admitting: Neurology

## 2016-05-17 VITALS — BP 151/82 | HR 83 | Ht 61.0 in | Wt 269.5 lb

## 2016-05-17 DIAGNOSIS — N3941 Urge incontinence: Secondary | ICD-10-CM

## 2016-05-17 DIAGNOSIS — N200 Calculus of kidney: Secondary | ICD-10-CM

## 2016-05-17 DIAGNOSIS — N952 Postmenopausal atrophic vaginitis: Secondary | ICD-10-CM

## 2016-05-17 DIAGNOSIS — N390 Urinary tract infection, site not specified: Secondary | ICD-10-CM

## 2016-05-17 MED ORDER — OXYBUTYNIN CHLORIDE 5 MG PO TABS: 5.0000 mg | ORAL_TABLET | Freq: Three times a day (TID) | ORAL | 3 refills | Status: DC

## 2016-05-17 NOTE — Telephone Encounter (Signed)
I reviewed the notes in EP[facility] and cannot find mention that [facility] has given pt injections in her back. Pt could have received injections in her back prior to EPIC in 2014, but that would be in our old EMR system. I spoke to Judson Roch and advised her of this; they are in EPIC as well. I don't mind pulling the old EMR records from prior to 2014 if needed.

## 2016-05-17 NOTE — Progress Notes (Signed)
8:47 AM  05/18/16  Susan House [birth year] [id number]  Referring provider: Jerrol Banana., MD [address] [place], [place] [address]  Chief Complaint  Patient presents with  . Follow-up    recurrent UTI    HPI: 60 yo F with recurrent UTIs who returns today for follow up.    Since last visit, she has had 3 UTIs over 6 month period.  One was treated by Dr. Rosanna Randy, one at our office with a straight cath specimen, and one two weeks ago which she self treated with cipro x 1 week.  She reports that last week while at the [facility], she began to experience symptoms of burning along with urinary urgency and frequency. As the weekend so she just took some Cipro that she had at home. She never came in for UA/urine culture. None of her infections have been associated with fevers. No hospitalizations or infections.  Baseline urinary symptoms include incontitnence following stroke 2000.  Previous PVRs minimal.  She does have a 2 mm left lower pole kidney stone which is asymptomatic. He has been stable for many years.  No personal history of passing kidney stones.  + FH of stones.    Last cysto 2015 unremarkable.    She is taking her cranberry tablets but is not using her estrogen cream on a regular basis.  She is was previously on probiotic but has run out and stopped taking them.     PMH: Past Medical History:  Diagnosis Date  . Allergy   . Arthritis   . Cellulitis and abscess of face   . Depression   . Diabetes mellitus ([place])   . Edema   . GERD (gastroesophageal reflux disease)   . Hematuria   . History of echocardiogram    2008: normal, 2011: normal LVSF  . History of nuclear stress test    a. 12/2009: lexiscan - negative  . Hyperlipidemia   . Hypertension   . IBS (irritable bowel syndrome)   . Migraine   . Morbid obesity ([place])   . Nocturia   . Stroke ([place]) 2000  . TIA (transient ischemic attack) 2010  . Urgency of micturation   . Urinary  frequency   . Urinary incontinence     Surgical History: Past Surgical History:  Procedure Laterality Date  . ABDOMINAL HYSTERECTOMY    . ABDOMINOPLASTY    . APPENDECTOMY    . BACK SURGERY     extensive spinal fusion  . carpel tunnel surgery    . CATARACT EXTRACTION     x 2  . CESAREAN SECTION    . CHOLECYSTECTOMY    . COLONOSCOPY  2010  . EYE SURGERY    . HERNIA REPAIR    . SHOULDER SURGERY    . TOE SURGERY    . UPPER GI ENDOSCOPY    . WRIST FRACTURE SURGERY Left     Home Medications:    Medication List       Accurate as of 05/17/16 11:59 PM. Always use your most recent med list.          ACCU-CHEK AVIVA PLUS test strip Generic drug:  glucose blood TEST BLOOD SUGAR THREE TIMES DAILY   ACCU-CHEK AVIVA PLUS w/Device Kit Check sugar three times daily DX E11.9-needs a meter   accu-chek softclix lancets Check sugar three times daily DX E11.9   ACCU-CHEK SOFTCLIX LANCETS lancets CHECK BLOOD SUGAR THREE TIMES DAILY   amLODipine 10 MG tablet Commonly  known as:  NORVASC Take 1 tablet (10 mg total) by mouth daily.   amoxicillin-clavulanate 875-125 MG tablet Commonly known as:  AUGMENTIN Take 1 tablet by mouth 2 (two) times daily.   aspirin 325 MG tablet Take 325 mg by mouth daily.   atorvastatin 20 MG tablet Commonly known as:  LIPITOR Take 1 tablet (20 mg total) by mouth at bedtime.   baclofen 20 MG tablet Commonly known as:  LIORESAL Take 20 mg by mouth 4 (four) times daily.   clopidogrel 75 MG tablet Commonly known as:  PLAVIX TAKE 1 TABLET EVERY DAY   Cranberry 1000 MG Caps Take 2,000 mg by mouth daily.   enalapril 20 MG tablet Commonly known as:  VASOTEC TAKE 1 TABLET TWICE DAILY   gabapentin 100 MG capsule Commonly known as:  NEURONTIN Take 100 mg by mouth 3 (three) times daily.   hydrochlorothiazide 25 MG tablet Commonly known as:  HYDRODIURIL TAKE 1 TABLET EVERY DAY   HYDROcodone-acetaminophen 10-325 MG tablet Commonly known as:   NORCO Take 1-2 tablets by mouth every 4 (four) hours as needed.   insulin aspart protamine- aspart (70-30) 100 UNIT/ML injection Commonly known as:  NOVOLOG MIX 70/30 Inject 25-30 Units into the skin 2 (two) times daily.   magnesium oxide 400 (241.3 Mg) MG tablet Commonly known as:  MAG-OX Take 1 tablet (400 mg total) by mouth daily.   meclizine 25 MG tablet Commonly known as:  ANTIVERT Take 1 tablet (25 mg total) by mouth 3 (three) times daily as needed for dizziness.   Melatonin 10 MG Tabs Take 1 tablet by mouth at bedtime.   metFORMIN 1000 MG tablet Commonly known as:  GLUCOPHAGE TAKE 1 TABLET TWICE DAILY   mometasone 0.1 % cream Commonly known as:  ELOCON Apply once daily for 1 week at a time.   nitroGLYCERIN 0.4 MG SL tablet Commonly known as:  NITROSTAT Place 1 tablet (0.4 mg total) under the tongue every 5 (five) minutes as needed for chest pain.   oxybutynin 5 MG tablet Commonly known as:  DITROPAN Take 1 tablet (5 mg total) by mouth 3 (three) times daily.   pantoprazole 40 MG tablet Commonly known as:  PROTONIX Take 1 tablet (40 mg total) by mouth daily.       Allergies:  Allergies  Allergen Reactions  . Morphine And Related Anaphylaxis  . Fentanyl Other (See Comments)    Unknown reaction.  . Reglan [Metoclopramide]     Elevated BP  . Simvastatin Other (See Comments)    Muscle pain  . Betadine [Povidone Iodine] Rash  . Iodine Rash  . Tetracyclines & Related Rash    Family History: Family History  Problem Relation Age of Onset  . Hyperlipidemia Mother   . Arrhythmia Mother     WPW  . Hypertension Mother   . Rheum arthritis Mother   . Heart disease Mother   . Heart attack Father 46  . Hyperlipidemia Father   . Hypertension Father   . Stroke Father   . Diabetes Father   . Heart disease Father   . Coronary artery disease Father   . Diabetes Sister   . Hyperlipidemia Sister   . Hypertension Sister   . Depression Sister   . Depression  Sister   . Diabetes Sister   . Hypertension Sister   . Hyperlipidemia Sister   . Kidney disease Paternal Grandmother     Social History:  reports that she has never smoked. She has never  used smokeless tobacco. She reports that she does not drink alcohol or use drugs.  ROS: UROLOGY Frequent Urination?: No Hard to postpone urination?: No Burning/pain with urination?: No Get up at night to urinate?: No Leakage of urine?: Yes Urine stream starts and stops?: No Trouble starting stream?: No Do you have to strain to urinate?: No Blood in urine?: No Urinary tract infection?: No Sexually transmitted disease?: No Injury to kidneys or bladder?: No Painful intercourse?: No Weak stream?: No Currently pregnant?: No Vaginal bleeding?: No Last menstrual period?: n  Gastrointestinal Nausea?: No Vomiting?: No Indigestion/heartburn?: No Diarrhea?: No Constipation?: No  Constitutional Fever: No Night sweats?: No Weight loss?: No Fatigue?: No  Skin Skin rash/lesions?: No Itching?: No  Eyes Blurred vision?: No Double vision?: No  Ears/Nose/Throat Sore throat?: No Sinus problems?: No  Hematologic/Lymphatic Swollen glands?: No Easy bruising?: No  Cardiovascular Leg swelling?: No Chest pain?: No  Respiratory Cough?: No Shortness of breath?: No  Endocrine Excessive thirst?: No  Musculoskeletal Back pain?: Yes Joint pain?: No  Neurological Headaches?: No Dizziness?: Yes  Psychologic Depression?: No Anxiety?: No  Physical Exam: BP (!) 151/82   Pulse 83   Ht 5' 1"  (1.549 m)   Wt 269 lb 8 oz (122.2 kg)   BMI 50.92 kg/m   Constitutional:  Alert and oriented, No acute distress.  Presents with husband today. HEENT: [place] AT, moist mucus membranes.  Trachea midline, no masses. Cardiovascular: No clubbing, cyanosis, or edema. Respiratory: Normal respiratory effort, no increased work of breathing. GI: Abdomen is soft, nontender, nondistended, no abdominal masses.   Morbidly obese.   GU: No CVA tenderness.  Skin: No rashes, bruises or suspicious lesions. Neurologic: Grossly intact, no focal deficits, moving all 4 extremities. Psychiatric: Normal mood and affect.  Laboratory Data: Component     Latest Ref Rng 12/22/2015          Glucose     65 - 99 mg/dL 232 (H)  BUN     6 - 20 mg/dL 23 (H)  Creatinine     0.44 - 1.00 mg/dL 1.31 (H)  Sodium     135 - 145 mmol/L 137  Potassium     3.5 - 5.1 mmol/L 3.9  Chloride     101 - 111 mmol/L 106  CO2     22 - 32 mmol/L 18 (L)  Calcium     8.9 - 10.3 mg/dL 9.3  AST     15 - 41 U/L 28  ALT     14 - 54 U/L 16  Alkaline Phosphatase     38 - 126 U/L 79  Albumin     3.5 - 5.0 g/dL 4.0  Total Protein     6.5 - 8.1 g/dL 7.2  Total Bilirubin     0.3 - 1.2 mg/dL 0.6  Anion gap     5 - 15 13  EGFR (African American)     >60 mL/min 51 (L)  EGFR (Non-African Amer.)     >60 mL/min 44 (L)    Urinalysis Currently asymptomatic, we'll defer urinalysis today   Pertinent Imaging: No new imaging  Assessment & Plan:    1. Recurrent UTI 3 urinary tract infections over 6 month period. Given concern for development of antibiotic resistance, we'll continue to defer suppressive therapy at this time.  Advised to return to our office when symptomatic.  She will need to be catheterized to obtain an accurate UA/urine culture given her habitus.     Recommend against  self treatment and future.  Recommendations reviewed including continued use of probiotic, cranberry tablets, and a string cream.  2. Nonobstructing 2 mm LLP stone Stable in size since 2015, CT essentially unchanged No intervention recommended No source of flank pain or and unlikely to be nidus recurrent infections  2. Atrophic vaginitis History of atrophic vaginitis- has not been using cream again Reinforced this again today  3. Urge incontinence of urine Stable, baseline since stroke.     Return in about 1 year (around 05/17/2017)  for UTI recheck.  Hollice Espy, MD  [facility] [address], [place], [place] [address] [phone]

## 2016-05-17 NOTE — Telephone Encounter (Signed)
-----   Message from Trinna Post, [place] sent at 05/17/2016  8:14 AM EDT ----- No acute fracture or malalignment of vertebrae. Please advise patient to continue with physiatry referral.

## 2016-05-17 NOTE — Telephone Encounter (Signed)
Sarah/[facility] [phone] called wanting to know if there were any injections given to the pt in her back.

## 2016-05-17 NOTE — Telephone Encounter (Signed)
LMTCB 05/17/2016  Thanks,   -Mickel Baas

## 2016-05-18 NOTE — Telephone Encounter (Signed)
Patient has been advised. KW 

## 2016-05-19 ENCOUNTER — Encounter: Payer: Self-pay | Admitting: Family Medicine

## 2016-05-24 ENCOUNTER — Other Ambulatory Visit: Payer: Self-pay | Admitting: Pharmacist

## 2016-05-24 NOTE — Patient Outreach (Signed)
Susan House was referred to pharmacy for medication management related to her blood sugar control. Left a HIPAA compliant message on the patient's voicemail. If have not heard from patient by 05/26/16, will give her another call at that time.   Harlow Asa, PharmD Clinical Pharmacist [facility] [phone]

## 2016-05-25 ENCOUNTER — Telehealth: Payer: Self-pay | Admitting: Urology

## 2016-05-25 ENCOUNTER — Telehealth: Payer: Self-pay

## 2016-05-25 ENCOUNTER — Ambulatory Visit (INDEPENDENT_AMBULATORY_CARE_PROVIDER_SITE_OTHER): Payer: Commercial Managed Care - HMO

## 2016-05-25 VITALS — BP 135/74 | HR 102 | Temp 97.4°F | Wt 269.0 lb

## 2016-05-25 DIAGNOSIS — N390 Urinary tract infection, site not specified: Secondary | ICD-10-CM

## 2016-05-25 DIAGNOSIS — R32 Unspecified urinary incontinence: Secondary | ICD-10-CM | POA: Diagnosis not present

## 2016-05-25 DIAGNOSIS — M549 Dorsalgia, unspecified: Secondary | ICD-10-CM | POA: Diagnosis not present

## 2016-05-25 DIAGNOSIS — R410 Disorientation, unspecified: Secondary | ICD-10-CM | POA: Diagnosis not present

## 2016-05-25 LAB — URINALYSIS, COMPLETE
Bilirubin, UA: NEGATIVE
Glucose, UA: NEGATIVE
NITRITE UA: POSITIVE — AB
PH UA: 5.5 (ref 5.0–7.5)
Specific Gravity, UA: >1.03 — ABNORMAL HIGH (ref 1.005–1.030)
UUROB: 0.2 mg/dL (ref 0.2–1.0)

## 2016-05-25 LAB — MICROSCOPIC EXAMINATION
Bacteria, UA: NONE SEEN
EPITHELIAL CELLS (NON RENAL): NONE SEEN /HPF (ref 0–10)
RBC, UA: NONE SEEN /hpf (ref 0–?)

## 2016-05-25 MED ORDER — SULFAMETHOXAZOLE-TRIMETHOPRIM 800-160 MG PO TABS: 1.0000 | ORAL_TABLET | Freq: Two times a day (BID) | ORAL | 0 refills | Status: AC

## 2016-05-25 NOTE — Telephone Encounter (Signed)
Patient is coming in today for a cath UA for burning with urination possible UTI  Just wanted you to be aware to watch for the results   Susan House

## 2016-05-25 NOTE — Telephone Encounter (Signed)
Spoke with pt husband in reference bactrim. Made aware abx sent to pharmacy. Pt husband voiced understanding.

## 2016-05-25 NOTE — Progress Notes (Signed)
In and Out Catheterization  Patient is present today for a I & O catheterization due to possible UTI. Patient was cleaned and prepped in a sterile fashion with betadine and Lidocaine 2% jelly was instilled into the urethra.  A 14FR cath was inserted no complications were noted , 85ml of urine return was noted, urine was milky white in color. A clean urine sample was collected for u/a and cx. Bladder was drained  And catheter was removed with out difficulty.    Preformed by: Toniann Fail, LPN   Pt c/o urinary frequency, dysuria, leakage of urine, and nausea. Pt has been on abx in the last 30 days for a sinus infection. Dr. Rosanna Randy rx abx.   Blood pressure 135/74, pulse (!) 102, temperature 97.4 F (36.3 C), weight 269 lb (122 kg).

## 2016-05-25 NOTE — Telephone Encounter (Signed)
-----   Message from Hollice Espy, MD sent at 05/25/2016  1:44 PM EST ----- Highly concerning for infection. We'll go ahead and treat presumptively with Bactrim DS twice a day 7 days. We'll adjust as needed when culture and sensitivity data has resulted.  Hollice Espy, MD

## 2016-05-26 ENCOUNTER — Other Ambulatory Visit: Payer: Self-pay | Admitting: Pharmacist

## 2016-05-26 ENCOUNTER — Ambulatory Visit: Payer: Self-pay | Admitting: Pharmacist

## 2016-05-26 NOTE — Patient Outreach (Signed)
Susan House was referred to pharmacy for medication management related to her blood sugar control. Left a HIPAA compliant message on the patient's voicemail. Outreach attempt #2. If have not heard from patient by 05/29/16, will give her another call at that time.   Harlow Asa, PharmD Clinical Pharmacist [facility] [phone]

## 2016-05-29 ENCOUNTER — Telehealth: Payer: Self-pay

## 2016-05-29 ENCOUNTER — Other Ambulatory Visit: Payer: Self-pay | Admitting: Pharmacist

## 2016-05-29 ENCOUNTER — Ambulatory Visit: Payer: Self-pay | Admitting: Pharmacist

## 2016-05-29 DIAGNOSIS — N390 Urinary tract infection, site not specified: Secondary | ICD-10-CM

## 2016-05-29 LAB — CULTURE, URINE COMPREHENSIVE

## 2016-05-29 MED ORDER — CIPROFLOXACIN HCL 500 MG PO TABS: 500.0000 mg | ORAL_TABLET | Freq: Two times a day (BID) | ORAL | 0 refills | Status: DC

## 2016-05-29 NOTE — Telephone Encounter (Signed)
-----   Message from Hollice Espy, MD sent at 05/29/2016  7:21 AM EST ----- Urine culture resistant to bactrim.  Please change abx to cipro 500 mg bid x 7 day.    Hollice Espy, MD

## 2016-05-29 NOTE — Telephone Encounter (Signed)
Spoke with pt in reference to abx change. Pt voiced understanding.

## 2016-05-29 NOTE — Patient Outreach (Signed)
Susan House was referred to pharmacy for medication management related to her blood sugar control. Left another HIPAA compliant message on the patient's home voicemail. Also called patient's mobile number, but patient has a voicemail box that has not yet been setup on this line.Outreach attempt #3. Will send a coordination of care message to [facility]. Will also send patient an outreach letter to attempt contact. If have not heard back from [name] within 14 days, will close pharmacy episode.  Harlow Asa, PharmD Clinical Pharmacist [facility] [phone]

## 2016-06-09 ENCOUNTER — Other Ambulatory Visit: Payer: Self-pay | Admitting: Family Medicine

## 2016-06-09 DIAGNOSIS — E876 Hypokalemia: Secondary | ICD-10-CM

## 2016-06-09 DIAGNOSIS — E860 Dehydration: Secondary | ICD-10-CM

## 2016-06-12 ENCOUNTER — Ambulatory Visit (INDEPENDENT_AMBULATORY_CARE_PROVIDER_SITE_OTHER): Payer: Commercial Managed Care - HMO | Admitting: Family Medicine

## 2016-06-12 ENCOUNTER — Telehealth: Payer: Self-pay

## 2016-06-12 ENCOUNTER — Other Ambulatory Visit: Payer: Self-pay | Admitting: Pharmacist

## 2016-06-12 VITALS — BP 132/68 | HR 74 | Temp 97.8°F | Resp 14 | Wt 273.0 lb

## 2016-06-12 DIAGNOSIS — M5441 Lumbago with sciatica, right side: Secondary | ICD-10-CM | POA: Diagnosis not present

## 2016-06-12 DIAGNOSIS — I69359 Hemiplegia and hemiparesis following cerebral infarction affecting unspecified side: Secondary | ICD-10-CM | POA: Diagnosis not present

## 2016-06-12 DIAGNOSIS — E1122 Type 2 diabetes mellitus with diabetic chronic kidney disease: Secondary | ICD-10-CM | POA: Diagnosis not present

## 2016-06-12 DIAGNOSIS — N183 Chronic kidney disease, stage 3 (moderate): Secondary | ICD-10-CM | POA: Diagnosis not present

## 2016-06-12 DIAGNOSIS — I10 Essential (primary) hypertension: Secondary | ICD-10-CM

## 2016-06-12 DIAGNOSIS — G8929 Other chronic pain: Secondary | ICD-10-CM | POA: Diagnosis not present

## 2016-06-12 DIAGNOSIS — M5442 Lumbago with sciatica, left side: Secondary | ICD-10-CM | POA: Diagnosis not present

## 2016-06-12 DIAGNOSIS — K219 Gastro-esophageal reflux disease without esophagitis: Secondary | ICD-10-CM | POA: Diagnosis not present

## 2016-06-12 DIAGNOSIS — E860 Dehydration: Secondary | ICD-10-CM | POA: Diagnosis not present

## 2016-06-12 LAB — POCT GLYCOSYLATED HEMOGLOBIN (HGB A1C): Hemoglobin A1C: 8.5

## 2016-06-12 MED ORDER — PANTOPRAZOLE SODIUM 40 MG PO TBEC: 40.0000 mg | DELAYED_RELEASE_TABLET | Freq: Every day | ORAL | 3 refills | Status: DC

## 2016-06-12 MED ORDER — HYDROCODONE-ACETAMINOPHEN 10-325 MG PO TABS: 1.0000 | ORAL_TABLET | ORAL | 0 refills | Status: DC | PRN

## 2016-06-12 MED ORDER — AMLODIPINE BESYLATE 10 MG PO TABS: 10.0000 mg | ORAL_TABLET | Freq: Every day | ORAL | 3 refills | Status: DC

## 2016-06-12 MED ORDER — BACLOFEN 20 MG PO TABS
20.0000 mg | ORAL_TABLET | Freq: Three times a day (TID) | ORAL | 3 refills | Status: DC
Start: 2016-06-12 — End: 2016-07-11

## 2016-06-12 MED ORDER — MAGNESIUM OXIDE 400 (241.3 MG) MG PO TABS: 1.0000 | ORAL_TABLET | Freq: Every day | ORAL | 3 refills | Status: DC

## 2016-06-12 MED ORDER — OXYBUTYNIN CHLORIDE 5 MG PO TABS: 5.0000 mg | ORAL_TABLET | Freq: Three times a day (TID) | ORAL | 3 refills | Status: DC

## 2016-06-12 MED ORDER — METFORMIN HCL 1000 MG PO TABS: 1000.0000 mg | ORAL_TABLET | Freq: Two times a day (BID) | ORAL | 3 refills | Status: DC

## 2016-06-12 MED ORDER — ENALAPRIL MALEATE 20 MG PO TABS: 20.0000 mg | ORAL_TABLET | Freq: Two times a day (BID) | ORAL | 3 refills | Status: DC

## 2016-06-12 MED ORDER — ATORVASTATIN CALCIUM 20 MG PO TABS
20.0000 mg | ORAL_TABLET | Freq: Every day | ORAL | 3 refills | Status: DC
Start: 2016-06-12 — End: 2016-07-28

## 2016-06-12 MED ORDER — GABAPENTIN 100 MG PO CAPS: 100.0000 mg | ORAL_CAPSULE | Freq: Three times a day (TID) | ORAL | 3 refills | Status: DC

## 2016-06-12 MED ORDER — HYDROCHLOROTHIAZIDE 25 MG PO TABS: 25.0000 mg | ORAL_TABLET | Freq: Every day | ORAL | 3 refills | Status: DC

## 2016-06-12 MED ORDER — CLOPIDOGREL BISULFATE 75 MG PO TABS: 75.0000 mg | ORAL_TABLET | Freq: Every day | ORAL | 3 refills | Status: DC
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
## 2016-06-12 NOTE — Telephone Encounter (Signed)
Patient states that Dr Sharlet Salina office called her and told her they would not take her in since she was going to [facility] office but patient does not go there anymore. Can we try and call them and see if they thought she was trying to go to 2 different places. For travel convenience would like to the in [place] area. Let me know please-aa

## 2016-06-13 ENCOUNTER — Ambulatory Visit: Payer: Self-pay | Admitting: *Deleted

## 2016-06-13 NOTE — Telephone Encounter (Signed)
Pt advised on her voicemail-aa

## 2016-06-13 NOTE — Telephone Encounter (Signed)
I spoke with Caryl Pina from Dr Sharlet Salina office who states he didn't feel there was anything he could do to help her.He doesn't want to waste her time

## 2016-06-14 ENCOUNTER — Other Ambulatory Visit: Payer: Self-pay | Admitting: *Deleted

## 2016-06-14 NOTE — Patient Outreach (Signed)
[facility]) Care Management   06/14/2016  Susan House 11/24/1955 [id number]  Susan House Susan House is an 60 y.o. female  Subjective:  Pt reports on recent visit with Dr. Rosanna House, A1c went from 8.2 to 8.5 to  Which pt thinks  Was result of UTIs.   Pt reports currently has no signs/symptoms of UTI, sugars are down.   Pt reports She is keeping up  with her sugars as MD wants A1C down to 8, checking 4 times a day, taking insulin Just in am and suppertime.  Pt reports she did talk to Susan House pharmacist about her medications.  Pt Reports her energy is better, walking more.   Pt reports she continues with chronic back pain,+6  Today.   Discussed with pt continuing to follow with community nurse case management services  To which pt declined, her goals are to keep sugars within norm, loose weight, manage insulin/diet.   Objective:   Vitals:   06/14/16 1543  BP: (!) 158/74  Pulse: 82  Resp: 16   ROS  Physical Exam  Constitutional: She is oriented to person, place, and time. She appears well-developed and well-nourished.  Cardiovascular: Normal rate.   Slight irregularity auscultated in rhythm.    Respiratory: Effort normal and breath sounds normal.  GI: Soft.  Musculoskeletal: She exhibits edema.  Trace edema left lower leg.    Neurological: She is alert and oriented to person, place, and time.  Skin: Skin is warm and dry.  Psychiatric: She has a normal mood and affect. Her behavior is normal. Judgment and thought content normal.    Encounter Medications:   Outpatient Encounter Prescriptions as of 06/14/2016  Medication Sig Note  . ACCU-CHEK AVIVA PLUS test strip TEST BLOOD SUGAR THREE TIMES DAILY   . ACCU-CHEK SOFTCLIX LANCETS lancets CHECK BLOOD SUGAR THREE TIMES DAILY   . amLODipine (NORVASC) 10 MG tablet Take 1 tablet (10 mg total) by mouth daily.   Marland Kitchen aspirin 325 MG tablet Take 325 mg by mouth daily.   Marland Kitchen atorvastatin (LIPITOR) 20 MG tablet Take 1 tablet (20  mg total) by mouth at bedtime.   . baclofen (LIORESAL) 20 MG tablet Take 1 tablet (20 mg total) by mouth 3 (three) times daily.   . Blood Glucose Monitoring Suppl (ACCU-CHEK AVIVA PLUS) w/Device KIT Check sugar three times daily DX E11.9-needs a meter   . clopidogrel (PLAVIX) 75 MG tablet Take 1 tablet (75 mg total) by mouth daily.   . Cranberry 1000 MG CAPS Take 2,000 mg by mouth daily.  05/10/2016: Per pt- taking 2 tablets 450 mg daily   . enalapril (VASOTEC) 20 MG tablet Take 1 tablet (20 mg total) by mouth 2 (two) times daily.   Marland Kitchen gabapentin (NEURONTIN) 100 MG capsule Take 1 capsule (100 mg total) by mouth 3 (three) times daily.   . hydrochlorothiazide (HYDRODIURIL) 25 MG tablet Take 1 tablet (25 mg total) by mouth daily.   Marland Kitchen HYDROcodone-acetaminophen (NORCO) 10-325 MG tablet Take 1-2 tablets by mouth every 4 (four) hours as needed. 06/14/2016: As needed.   . insulin aspart protamine- aspart (NOVOLOG MIX 70/30) (70-30) 100 UNIT/ML injection Inject 25-30 Units into the skin 2 (two) times daily.    Elmore Guise Devices (ACCU-CHEK SOFTCLIX) lancets Check sugar three times daily DX E11.9   . magnesium oxide (MAG-OX) 400 (241.3 Mg) MG tablet Take 1 tablet (400 mg total) by mouth daily.   . metFORMIN (GLUCOPHAGE) 1000 MG tablet Take 1 tablet (1,000 mg total)  by mouth 2 (two) times daily.   . mometasone (ELOCON) 0.1 % cream Apply once daily for 1 week at a time. 06/14/2016: As needed.   Marland Kitchen oxybutynin (DITROPAN) 5 MG tablet Take 1 tablet (5 mg total) by mouth 3 (three) times daily.   . pantoprazole (PROTONIX) 40 MG tablet Take 1 tablet (40 mg total) by mouth daily.   . nitroGLYCERIN (NITROSTAT) 0.4 MG SL tablet Place 1 tablet (0.4 mg total) under the tongue every 5 (five) minutes as needed for chest pain. (Patient not taking: Reported on 06/14/2016) 06/14/2016: Available if needed.    No facility-administered encounter medications on file as of 06/14/2016.     Functional Status:   In your present state  of health, do you have any difficulty performing the following activities: 04/26/2016 04/06/2016  Hearing? N N  Vision? N N  Difficulty concentrating or making decisions? N N  Walking or climbing stairs? Y Y  Dressing or bathing? Y Y  Doing errands, shopping? Tempie Donning  Preparing Food and eating ? N -  Using the Toilet? N -  In the past six months, have you accidently leaked urine? N -  Do you have problems with loss of bowel control? N -  Managing your Medications? N -  Managing your Finances? N -  Housekeeping or managing your Housekeeping? Y -  Some recent data might be hidden    Fall/Depression Screening:    PHQ 2/9 Scores 04/26/2016 09/08/2015 03/11/2015  PHQ - 2 Score 0 0 0    Assessment:  Pleasant 60 year old woman, resides with spouse.                           Lungs clear, trace edema in left lower extremity.                          Chronic pain- lower back, today +6 sitting.                           Diabetes- per pt, did not check sugar today.  View of pt's glucometer last reading 11/27 -                           179 pm, 112 am.   Averages 7 day- 201, 14 day- 210, 30 day- 214.                            Provided pt with information on Basic Carb counting/reviewed good carbs, portion                               Sizes.      Plan:   As discussed with pt, plan to discharge from community nurse case management services,                Care plan updated.                  Plan to send pt  case closure letter which includes [facility]'s main number to call if needs arise in the                  Future.  Plan to send case closure letter to Dr. Rosanna House.              Plan to inform [facility] care management assistant to close case.         [facility] CM Care Plan Problem One   Flowsheet Row Most Recent Value  Care Plan Problem One  Flunctuation in blood sugars- several high readings   Role Documenting the Problem One  Care Management [place] for Problem One  Active  [facility]  Long Term Goal (31-90 days)  Pt would see a decrease in her sugars in the next 40 days   [facility] Long Term Goal Start Date  05/10/16  [facility] Long Term Goal Met Date  06/14/16  Interventions for Problem One Long Term Goal  Reviewed pt's glucometer readings, provided pt with information on Basic Carb Counting- reviewed.   [facility] CM Short Term Goal #1 (0-30 days)  Pt would have protein with all of her meals/snacks within the next 30 days   [facility] CM Short Term Goal #1 Start Date  05/10/16  [facility] CM Short Term Goal #1 Met Date  06/14/16    [facility] CM Care Plan Problem Two   Flowsheet Row Most Recent Value  Care Plan Problem Two  Decreased energy level   Role Documenting the Problem Two  Care Management Coordinator  Care Plan for Problem Two  Active  [facility] CM Short Term Goal #1 (0-30 days)  Pt would see energy level increase in the next 30 days   [facility] CM Short Term Goal #1 Start Date  05/10/16  [facility] CM Short Term Goal #1 Met Date   06/14/16     Zara Chess.   [place] [facility] Care Management  [phone]

## 2016-06-14 NOTE — Patient Outreach (Signed)
[facility]) Care Management  [place]   06/14/2016  Susan House 1956/04/26 [id number]  Subjective: Susan House was referred to pharmacy for medication management. Called and spoke with patient. HIPAA identifiers verified and verbal consent received. Reviewed with patient each of her medications including name, dose, indication and administration.   [name] reports that her fasting blood sugar this morning was 112 mg/dL. 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 Reports that she titrates her own insulin based on her blood sugar as directed by her PCP. Patient describes her scale for titration that has her injecting typically 20 units, but up to to 40 units, of Novolog 70/30 twice daily. Reports that she and her PCP have worked together to control her blood sugar particularly during times when she is sick. Reports that she does have symptoms of sweating and dizziness with low blood sugars. Reports appropriate treatment of low blood sugars with orange juice, frequent testing and having a meal to maintain her blood sugar after. Reports that she has a protein and carbohydrate snack each evening before bed, such as yogurt or peanut butter crackers.   [name] reports that she did have a fall about 6 weeks ago. Reports that she was bending over, lost control and fell. Reports that she was not injured, but that her husband had to call an ambulance because they needed help to get her up. Reports that she uses a cane inside her home and uses a walker when going outside of the home. Reports that she always uses railings. Reports that aside from this fall, she has had no more than one other in the past year. Patient reports that she does have occasional dizziness; reports that she has had this since her stroke. Discuss with patient the medications that may contribute to her dizziness such as baclofen, gabapentin, hydrocodone and oxybutynin. Counsel patient about  the importance of using more caution when taking these medications and avoiding using them in combination when possible. Patient verbalizes understanding.   Reports that she does have dry mouth with oxybutynin. Reports that she uses Biotene products to help with this. Discuss management of dry mouth. Reports occasional constipation. Reports improvement in urinary symptoms with use of oxybutynin. Reports that her Urologist has had her try alternatives, but these did not provide symptom relief. Counsel patient on the importance of having sufficient saliva in the event that she needed to use sublingual nitroglycerin. Patient verbalizes understanding. Counsel patient to check that her nitroglycerin is within date and counsel on proper storage of this medicaiton.  [name] reports that she has a visit with her PCP this afternoon.  Patient reports that she has no further medication questions/concerns at this time. Patient confirms that she has my phone number.  Objective:   Encounter Medications: Outpatient Encounter Prescriptions as of 06/12/2016  Medication Sig Note  . aspirin 325 MG tablet Take 325 mg by mouth daily.   . Cranberry 1000 MG CAPS Take 2,000 mg by mouth daily.  05/10/2016: Per pt- taking 2 tablets 450 mg daily   . insulin aspart protamine- aspart (NOVOLOG MIX 70/30) (70-30) 100 UNIT/ML injection Inject 25-30 Units into the skin 2 (two) times daily.    . mometasone (ELOCON) 0.1 % cream Apply once daily for 1 week at a time. 05/10/2016: As needed  . nitroGLYCERIN (NITROSTAT) 0.4 MG SL tablet Place 1 tablet (0.4 mg total) under the tongue every 5 (five) minutes as needed for chest pain. 05/10/2016:  Have available if needed.   . [DISCONTINUED] amLODipine (NORVASC) 10 MG tablet Take 1 tablet (10 mg total) by mouth daily.   . [DISCONTINUED] atorvastatin (LIPITOR) 20 MG tablet Take 1 tablet (20 mg total) by mouth at bedtime.   . [DISCONTINUED] baclofen (LIORESAL) 20 MG tablet Take 20 mg by  mouth 4 (four) times daily.   . [DISCONTINUED] clopidogrel (PLAVIX) 75 MG tablet TAKE 1 TABLET EVERY DAY   . [DISCONTINUED] enalapril (VASOTEC) 20 MG tablet TAKE 1 TABLET TWICE DAILY   . [DISCONTINUED] gabapentin (NEURONTIN) 100 MG capsule Take 100 mg by mouth 3 (three) times daily. 06/12/2016: Twice daily  . [DISCONTINUED] hydrochlorothiazide (HYDRODIURIL) 25 MG tablet TAKE 1 TABLET EVERY DAY   . [DISCONTINUED] HYDROcodone-acetaminophen (NORCO) 10-325 MG tablet Take 1-2 tablets by mouth every 4 (four) hours as needed.   . [DISCONTINUED] magnesium oxide (MAG-OX) 400 (241.3 Mg) MG tablet TAKE 1 TABLET EVERY DAY   . [DISCONTINUED] metFORMIN (GLUCOPHAGE) 1000 MG tablet TAKE 1 TABLET TWICE DAILY   . [DISCONTINUED] oxybutynin (DITROPAN) 5 MG tablet Take 1 tablet (5 mg total) by mouth 3 (three) times daily. (Patient not taking: Reported on 06/12/2016)   . [DISCONTINUED] pantoprazole (PROTONIX) 40 MG tablet Take 1 tablet (40 mg total) by mouth daily. 05/10/2016: Pt out of it, pt to call pharmacy   . ACCU-CHEK AVIVA PLUS test strip TEST BLOOD SUGAR THREE TIMES DAILY   . ACCU-CHEK SOFTCLIX LANCETS lancets CHECK BLOOD SUGAR THREE TIMES DAILY   . Blood Glucose Monitoring Suppl (ACCU-CHEK AVIVA PLUS) w/Device KIT Check sugar three times daily DX E11.9-needs a meter   . Lancet Devices (ACCU-CHEK SOFTCLIX) lancets Check sugar three times daily DX E11.9   . [DISCONTINUED] amoxicillin-clavulanate (AUGMENTIN) 875-125 MG tablet Take 1 tablet by mouth 2 (two) times daily.   . [DISCONTINUED] ciprofloxacin (CIPRO) 500 MG tablet Take 1 tablet (500 mg total) by mouth every 12 (twelve) hours.   . [DISCONTINUED] meclizine (ANTIVERT) 25 MG tablet Take 1 tablet (25 mg total) by mouth 3 (three) times daily as needed for dizziness. 05/10/2016: As needed.   . [DISCONTINUED] Melatonin 10 MG TABS Take 1 tablet by mouth at bedtime. (Patient not taking: Reported on 05/17/2016) 05/10/2016: As needed.    No facility-administered  encounter medications on file as of 06/12/2016.    Assessment:  Drugs sorted by system:  Cardiovascular: amlodipine, aspirin, atorvastatin, clopidogrel, enalapril, hydrochlorothiazide, Nitrostat  Gastrointestinal: pantoprazole  Endocrine: Novolog 70/30, metformin  Urinary: oxybutynin  Topical: mometasone cream  Pain: baclofen, gabapentin, Norco  Vitamins/Minerals: Cranberry, magnesium oxide   Duplications in therapy: none noted Gaps in therapy: none noted Drug interactions:  . Hydrocodone/baclofen/gabapentin: CNS depressants may enhance the CNS depressant effects of other CNS depressants. Patient counseled  Plan:  Will close pharmacy episode at this time.  Harlow Asa, PharmD Clinical Pharmacist [facility] [phone]

## 2016-06-15 ENCOUNTER — Encounter: Payer: Self-pay | Admitting: *Deleted

## 2016-06-27 ENCOUNTER — Telehealth: Payer: Self-pay | Admitting: *Deleted

## 2016-06-27 NOTE — Telephone Encounter (Signed)
Error pt is not on Entresto please disregard message below.

## 2016-06-27 NOTE — Telephone Encounter (Signed)
Pt has been approved for Entresto 24-26 mg tablet 06/26/16-06/26/18.

## 2016-06-29 ENCOUNTER — Ambulatory Visit (INDEPENDENT_AMBULATORY_CARE_PROVIDER_SITE_OTHER): Payer: Commercial Managed Care - HMO | Admitting: Family Medicine

## 2016-06-29 VITALS — BP 168/76 | HR 100 | Temp 97.5°F | Resp 18

## 2016-06-29 DIAGNOSIS — G8929 Other chronic pain: Secondary | ICD-10-CM

## 2016-06-29 DIAGNOSIS — N183 Chronic kidney disease, stage 3 unspecified: Secondary | ICD-10-CM

## 2016-06-29 DIAGNOSIS — N390 Urinary tract infection, site not specified: Secondary | ICD-10-CM | POA: Diagnosis not present

## 2016-06-29 DIAGNOSIS — L678 Other hair color and hair shaft abnormalities: Secondary | ICD-10-CM

## 2016-06-29 DIAGNOSIS — I69359 Hemiplegia and hemiparesis following cerebral infarction affecting unspecified side: Secondary | ICD-10-CM

## 2016-06-29 DIAGNOSIS — M5442 Lumbago with sciatica, left side: Secondary | ICD-10-CM

## 2016-06-29 DIAGNOSIS — E1122 Type 2 diabetes mellitus with diabetic chronic kidney disease: Secondary | ICD-10-CM

## 2016-06-29 DIAGNOSIS — M5441 Lumbago with sciatica, right side: Secondary | ICD-10-CM | POA: Diagnosis not present

## 2016-06-29 LAB — POCT URINALYSIS DIPSTICK
Bilirubin, UA: NEGATIVE
GLUCOSE UA: NEGATIVE
Ketones, UA: NEGATIVE
NITRITE UA: POSITIVE
Spec Grav, UA: 1.015
UROBILINOGEN UA: 0.2
pH, UA: 6

## 2016-06-29 MED ORDER — LIDOCAINE 5 % EX PTCH: 1.0000 | MEDICATED_PATCH | CUTANEOUS | 12 refills | Status: DC

## 2016-06-29 MED ORDER — NITROFURANTOIN MONOHYD MACRO 100 MG PO CAPS: 100.0000 mg | ORAL_CAPSULE | Freq: Two times a day (BID) | ORAL | 1 refills | Status: DC
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
## 2016-06-30 ENCOUNTER — Other Ambulatory Visit: Payer: Self-pay | Admitting: Family Medicine

## 2016-06-30 NOTE — Telephone Encounter (Signed)
Spoke with [facility] and they do have the RX and I advised patient, we are not sure what happened. [facility] does not open till 9 but advised patient per our system it says they received RX for Lidoderm patches. Patient states they had to send something back to [place] to get more information maybe but I have not seen anything yet and advised patient I will look out for any information on that.-aa

## 2016-06-30 NOTE — Telephone Encounter (Signed)
Pt contacted office for refill request on the following medications: 1. nitrofurantoin, macrocrystal-monohydrate, (MACROBID) 100 MG capsule  To [facility] 2. lidocaine (LIDODERM) 5 % To [facility]. Pt stated she has contacted both pharmacies and was advised that they haven't received the Rx. Please advise. Thanks TNP

## 2016-07-03 NOTE — Telephone Encounter (Signed)
-----   Message from Jerrol Banana., MD sent at 07/03/2016  8:12 AM EST ----- This mild UTI is resistant to nitrofurantoin. Just this time stop Nitrofurantoin  and use Cipro 250 twice a day for 3 days unless pt is already asymptomatic then do not get the cipro.

## 2016-07-03 NOTE — Telephone Encounter (Signed)
LMTCB

## 2016-07-04 LAB — PLEASE NOTE

## 2016-07-04 LAB — URINE CULTURE

## 2016-07-04 MED ORDER — CIPROFLOXACIN HCL 250 MG PO TABS: 250.0000 mg | ORAL_TABLET | Freq: Two times a day (BID) | ORAL | 0 refills | Status: DC

## 2016-07-04 NOTE — Telephone Encounter (Signed)
Patient advised as below. Patient verbalizes understanding and is in agreement with treatment plan.  

## 2016-07-05 ENCOUNTER — Ambulatory Visit (INDEPENDENT_AMBULATORY_CARE_PROVIDER_SITE_OTHER): Payer: Commercial Managed Care - HMO | Admitting: Adult Health

## 2016-07-05 ENCOUNTER — Encounter: Payer: Self-pay | Admitting: Adult Health

## 2016-07-05 VITALS — BP 163/68 | HR 75 | Ht 61.0 in | Wt 269.4 lb

## 2016-07-05 DIAGNOSIS — G8194 Hemiplegia, unspecified affecting left nondominant side: Secondary | ICD-10-CM

## 2016-07-05 DIAGNOSIS — G2581 Restless legs syndrome: Secondary | ICD-10-CM

## 2016-07-05 DIAGNOSIS — Z8673 Personal history of transient ischemic attack (TIA), and cerebral infarction without residual deficits: Secondary | ICD-10-CM | POA: Diagnosis not present

## 2016-07-05 NOTE — Progress Notes (Signed)
I agree with the assessment and plan as directed by NP .The patient is known to me .   Annebelle Bostic, MD  

## 2016-07-05 NOTE — Patient Instructions (Signed)
Continue Asprin and Plavix  Monitor Blood Pressure keep <130/90 HgbA1c <6.5 % Cholesterol LDL <70 If your symptoms worsen or you develop new symptoms please let us know.
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
## 2016-07-06 ENCOUNTER — Encounter: Payer: Self-pay | Admitting: Physician Assistant

## 2016-07-06 ENCOUNTER — Ambulatory Visit (INDEPENDENT_AMBULATORY_CARE_PROVIDER_SITE_OTHER): Payer: Commercial Managed Care - HMO | Admitting: Physician Assistant

## 2016-07-06 ENCOUNTER — Encounter: Payer: Self-pay | Admitting: Emergency Medicine

## 2016-07-06 ENCOUNTER — Emergency Department
Admission: EM | Admit: 2016-07-06 | Discharge: 2016-07-06 | Disposition: A | Discharge status: Home / Self Care | Payer: Commercial Managed Care - HMO | Attending: Emergency Medicine | Admitting: Emergency Medicine

## 2016-07-06 DIAGNOSIS — Y9389 Activity, other specified: Secondary | ICD-10-CM | POA: Insufficient documentation

## 2016-07-06 DIAGNOSIS — Z794 Long term (current) use of insulin: Secondary | ICD-10-CM | POA: Diagnosis not present

## 2016-07-06 DIAGNOSIS — Y999 Unspecified external cause status: Secondary | ICD-10-CM | POA: Insufficient documentation

## 2016-07-06 DIAGNOSIS — Z5321 Procedure and treatment not carried out due to patient leaving prior to being seen by health care provider: Secondary | ICD-10-CM | POA: Insufficient documentation

## 2016-07-06 DIAGNOSIS — Z79899 Other long term (current) drug therapy: Secondary | ICD-10-CM | POA: Diagnosis not present

## 2016-07-06 DIAGNOSIS — M62838 Other muscle spasm: Secondary | ICD-10-CM

## 2016-07-06 DIAGNOSIS — M549 Dorsalgia, unspecified: Secondary | ICD-10-CM | POA: Insufficient documentation

## 2016-07-06 DIAGNOSIS — Y92410 Unspecified street and highway as the place of occurrence of the external cause: Secondary | ICD-10-CM | POA: Diagnosis not present

## 2016-07-06 DIAGNOSIS — Z7982 Long term (current) use of aspirin: Secondary | ICD-10-CM | POA: Diagnosis not present

## 2016-07-06 DIAGNOSIS — I10 Essential (primary) hypertension: Secondary | ICD-10-CM | POA: Diagnosis not present

## 2016-07-06 DIAGNOSIS — E119 Type 2 diabetes mellitus without complications: Secondary | ICD-10-CM | POA: Diagnosis not present

## 2016-07-06 MED ORDER — CYCLOBENZAPRINE HCL 10 MG PO TABS: 10.0000 mg | ORAL_TABLET | Freq: Three times a day (TID) | ORAL | 0 refills | Status: DC | PRN
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
## 2016-07-06 NOTE — Patient Instructions (Signed)
Muscle Cramps and Spasms Muscle cramps and spasms are when muscles tighten by themselves. They usually get better within minutes. Muscle cramps are painful. They are usually stronger and last longer than muscle spasms. Muscle spasms may or may not be painful. They can last a few seconds or much longer. HOME CARE  Drink enough fluid to keep your pee (urine) clear or pale yellow.  Massage, stretch, and relax the muscle.  Use a warm towel, heating pad, or warm shower water on tight muscles.  Place ice on the muscle if it is tender or in pain.  Put ice in a plastic bag.  Place a towel between your skin and the bag.  Leave the ice on for 15-20 minutes, 3-4 times a day.  Only take medicine as told by your doctor. GET HELP RIGHT AWAY IF:  Your cramps or spasms get worse, happen more often, or do not get better with time. MAKE SURE YOU:  Understand these instructions.  Will watch your condition.  Will get help right away if you are not doing well or get worse. This information is not intended to replace advice given to you by your health care provider. Make sure you discuss any questions you have with your health care provider. Document Released: 06/15/2008 Document Revised: 10/28/2012 Document Reviewed: 04/06/2015 Elsevier Interactive Patient Education  2017 [employer].
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
## 2016-07-07 ENCOUNTER — Encounter: Payer: Self-pay | Admitting: Emergency Medicine

## 2016-07-07 ENCOUNTER — Observation Stay
Admission: EM | Admit: 2016-07-07 | Discharge: 2016-07-11 | Disposition: A | Discharge status: Home / Self Care | Payer: Commercial Managed Care - HMO | Attending: Internal Medicine | Admitting: Internal Medicine

## 2016-07-07 DIAGNOSIS — E039 Hypothyroidism, unspecified: Secondary | ICD-10-CM | POA: Insufficient documentation

## 2016-07-07 DIAGNOSIS — K589 Irritable bowel syndrome without diarrhea: Secondary | ICD-10-CM | POA: Diagnosis not present

## 2016-07-07 DIAGNOSIS — R351 Nocturia: Secondary | ICD-10-CM | POA: Diagnosis not present

## 2016-07-07 DIAGNOSIS — K559 Vascular disorder of intestine, unspecified: Secondary | ICD-10-CM

## 2016-07-07 DIAGNOSIS — D62 Acute posthemorrhagic anemia: Secondary | ICD-10-CM | POA: Diagnosis not present

## 2016-07-07 DIAGNOSIS — I69354 Hemiplegia and hemiparesis following cerebral infarction affecting left non-dominant side: Secondary | ICD-10-CM | POA: Diagnosis not present

## 2016-07-07 DIAGNOSIS — Z833 Family history of diabetes mellitus: Secondary | ICD-10-CM | POA: Insufficient documentation

## 2016-07-07 DIAGNOSIS — Z91041 Radiographic dye allergy status: Secondary | ICD-10-CM | POA: Insufficient documentation

## 2016-07-07 DIAGNOSIS — Z6841 Body Mass Index (BMI) 40.0 and over, adult: Secondary | ICD-10-CM | POA: Insufficient documentation

## 2016-07-07 DIAGNOSIS — K219 Gastro-esophageal reflux disease without esophagitis: Secondary | ICD-10-CM | POA: Diagnosis not present

## 2016-07-07 DIAGNOSIS — G2581 Restless legs syndrome: Secondary | ICD-10-CM | POA: Diagnosis not present

## 2016-07-07 DIAGNOSIS — Z818 Family history of other mental and behavioral disorders: Secondary | ICD-10-CM | POA: Insufficient documentation

## 2016-07-07 DIAGNOSIS — L309 Dermatitis, unspecified: Secondary | ICD-10-CM | POA: Insufficient documentation

## 2016-07-07 DIAGNOSIS — I10 Essential (primary) hypertension: Secondary | ICD-10-CM | POA: Diagnosis not present

## 2016-07-07 DIAGNOSIS — Z7902 Long term (current) use of antithrombotics/antiplatelets: Secondary | ICD-10-CM | POA: Insufficient documentation

## 2016-07-07 DIAGNOSIS — Z794 Long term (current) use of insulin: Secondary | ICD-10-CM | POA: Insufficient documentation

## 2016-07-07 DIAGNOSIS — Z90710 Acquired absence of both cervix and uterus: Secondary | ICD-10-CM | POA: Insufficient documentation

## 2016-07-07 DIAGNOSIS — B373 Candidiasis of vulva and vagina: Secondary | ICD-10-CM | POA: Insufficient documentation

## 2016-07-07 DIAGNOSIS — Z8249 Family history of ischemic heart disease and other diseases of the circulatory system: Secondary | ICD-10-CM | POA: Insufficient documentation

## 2016-07-07 DIAGNOSIS — Z823 Family history of stroke: Secondary | ICD-10-CM | POA: Insufficient documentation

## 2016-07-07 DIAGNOSIS — E11319 Type 2 diabetes mellitus with unspecified diabetic retinopathy without macular edema: Secondary | ICD-10-CM | POA: Insufficient documentation

## 2016-07-07 DIAGNOSIS — Z841 Family history of disorders of kidney and ureter: Secondary | ICD-10-CM | POA: Insufficient documentation

## 2016-07-07 DIAGNOSIS — R262 Difficulty in walking, not elsewhere classified: Secondary | ICD-10-CM

## 2016-07-07 DIAGNOSIS — G43909 Migraine, unspecified, not intractable, without status migrainosus: Secondary | ICD-10-CM | POA: Diagnosis not present

## 2016-07-07 DIAGNOSIS — G8929 Other chronic pain: Secondary | ICD-10-CM | POA: Insufficient documentation

## 2016-07-07 DIAGNOSIS — Z7982 Long term (current) use of aspirin: Secondary | ICD-10-CM | POA: Insufficient documentation

## 2016-07-07 DIAGNOSIS — K921 Melena: Secondary | ICD-10-CM

## 2016-07-07 DIAGNOSIS — Z881 Allergy status to other antibiotic agents status: Secondary | ICD-10-CM | POA: Insufficient documentation

## 2016-07-07 DIAGNOSIS — Z888 Allergy status to other drugs, medicaments and biological substances status: Secondary | ICD-10-CM | POA: Insufficient documentation

## 2016-07-07 DIAGNOSIS — E785 Hyperlipidemia, unspecified: Secondary | ICD-10-CM | POA: Diagnosis not present

## 2016-07-07 DIAGNOSIS — R350 Frequency of micturition: Secondary | ICD-10-CM | POA: Insufficient documentation

## 2016-07-07 DIAGNOSIS — Z79899 Other long term (current) drug therapy: Secondary | ICD-10-CM | POA: Insufficient documentation

## 2016-07-07 DIAGNOSIS — Z87440 Personal history of urinary (tract) infections: Secondary | ICD-10-CM | POA: Insufficient documentation

## 2016-07-07 DIAGNOSIS — Z9049 Acquired absence of other specified parts of digestive tract: Secondary | ICD-10-CM | POA: Insufficient documentation

## 2016-07-07 DIAGNOSIS — Y93G3 Activity, cooking and baking: Secondary | ICD-10-CM | POA: Insufficient documentation

## 2016-07-07 DIAGNOSIS — R531 Weakness: Secondary | ICD-10-CM | POA: Diagnosis present

## 2016-07-07 DIAGNOSIS — E860 Dehydration: Secondary | ICD-10-CM

## 2016-07-07 DIAGNOSIS — Z885 Allergy status to narcotic agent status: Secondary | ICD-10-CM | POA: Insufficient documentation

## 2016-07-07 DIAGNOSIS — L03116 Cellulitis of left lower limb: Secondary | ICD-10-CM | POA: Insufficient documentation

## 2016-07-07 DIAGNOSIS — F4322 Adjustment disorder with anxiety: Secondary | ICD-10-CM | POA: Diagnosis not present

## 2016-07-07 DIAGNOSIS — Z8614 Personal history of Methicillin resistant Staphylococcus aureus infection: Secondary | ICD-10-CM | POA: Insufficient documentation

## 2016-07-07 DIAGNOSIS — M6281 Muscle weakness (generalized): Secondary | ICD-10-CM

## 2016-07-07 DIAGNOSIS — E1151 Type 2 diabetes mellitus with diabetic peripheral angiopathy without gangrene: Secondary | ICD-10-CM | POA: Insufficient documentation

## 2016-07-07 DIAGNOSIS — G4700 Insomnia, unspecified: Secondary | ICD-10-CM | POA: Insufficient documentation

## 2016-07-07 DIAGNOSIS — I872 Venous insufficiency (chronic) (peripheral): Secondary | ICD-10-CM | POA: Insufficient documentation

## 2016-07-07 DIAGNOSIS — I878 Other specified disorders of veins: Secondary | ICD-10-CM | POA: Insufficient documentation

## 2016-07-07 DIAGNOSIS — Z8261 Family history of arthritis: Secondary | ICD-10-CM | POA: Insufficient documentation

## 2016-07-07 DIAGNOSIS — N179 Acute kidney failure, unspecified: Secondary | ICD-10-CM | POA: Insufficient documentation

## 2016-07-07 DIAGNOSIS — G459 Transient cerebral ischemic attack, unspecified: Principal | ICD-10-CM | POA: Diagnosis present

## 2016-07-07 DIAGNOSIS — E8881 Metabolic syndrome: Secondary | ICD-10-CM | POA: Diagnosis not present

## 2016-07-07 DIAGNOSIS — R32 Unspecified urinary incontinence: Secondary | ICD-10-CM | POA: Diagnosis not present

## 2016-07-07 DIAGNOSIS — G9341 Metabolic encephalopathy: Secondary | ICD-10-CM | POA: Insufficient documentation

## 2016-07-07 DIAGNOSIS — E876 Hypokalemia: Secondary | ICD-10-CM | POA: Diagnosis not present

## 2016-07-07 DIAGNOSIS — F320 Major depressive disorder, single episode, mild: Secondary | ICD-10-CM | POA: Diagnosis not present

## 2016-07-07 DIAGNOSIS — I69393 Ataxia following cerebral infarction: Secondary | ICD-10-CM | POA: Insufficient documentation

## 2016-07-07 LAB — BASIC METABOLIC PANEL
Anion gap: 11 (ref 5–15)
BUN: 45 mg/dL — AB (ref 6–20)
CO2: 22 mmol/L (ref 22–32)
CREATININE: 1.63 mg/dL — AB (ref 0.44–1.00)
Calcium: 9.1 mg/dL (ref 8.9–10.3)
Chloride: 106 mmol/L (ref 101–111)
GFR, EST AFRICAN AMERICAN: 39 mL/min — AB (ref >60)
GFR, EST NON AFRICAN AMERICAN: 33 mL/min — AB (ref >60)
Glucose, Bld: 192 mg/dL — ABNORMAL HIGH (ref 65–99)
POTASSIUM: 4.5 mmol/L (ref 3.5–5.1)
Sodium: 139 mmol/L (ref 135–145)

## 2016-07-07 LAB — TROPONIN I

## 2016-07-07 LAB — CBC
HCT: 36.6 % (ref 35.0–47.0)
HEMOGLOBIN: 11.9 g/dL — AB (ref 12.0–16.0)
MCH: 27.3 pg (ref 26.0–34.0)
MCHC: 32.4 g/dL (ref 32.0–36.0)
MCV: 84.3 fL (ref 80.0–100.0)
Platelets: 261 10*3/uL (ref 150–440)
RBC: 4.34 MIL/uL (ref 3.80–5.20)
RDW: 16.2 % — AB (ref 11.5–14.5)
WBC: 9.3 10*3/uL (ref 3.6–11.0)
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
## 2016-07-07 NOTE — ED Notes (Signed)
2 unsuccessful PIV attempts by this RN (right hand and right wrist).

## 2016-07-07 NOTE — ED Notes (Signed)
Pt. States she finished antibiotics for UTI yesterday.  Pt. Also states having stroke in 2000 that affected her lt. Side.

## 2016-07-07 NOTE — ED Notes (Signed)
Pt. Helped onto bedpan, pt. Had medium bowl movement.

## 2016-07-07 NOTE — ED Notes (Signed)
Pt. Had large bowel movement.  Pt. Helped in bed with bed pan.
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
## 2016-07-07 NOTE — ED Triage Notes (Signed)
Pt. States at around 10 pm this evening sudden onset of weakness to lt. Side.

## 2016-07-08 ENCOUNTER — Emergency Department: Payer: Commercial Managed Care - HMO

## 2016-07-08 ENCOUNTER — Observation Stay: Payer: Commercial Managed Care - HMO

## 2016-07-08 ENCOUNTER — Observation Stay (HOSPITAL_BASED_OUTPATIENT_CLINIC_OR_DEPARTMENT_OTHER)
Admit: 2016-07-08 | Discharge: 2016-07-08 | Disposition: A | Discharge status: Home / Self Care | Payer: Commercial Managed Care - HMO | Attending: Internal Medicine | Admitting: Internal Medicine

## 2016-07-08 DIAGNOSIS — G451 Carotid artery syndrome (hemispheric): Secondary | ICD-10-CM

## 2016-07-08 DIAGNOSIS — G459 Transient cerebral ischemic attack, unspecified: Secondary | ICD-10-CM | POA: Diagnosis not present

## 2016-07-08 LAB — URINALYSIS, COMPLETE (UACMP) WITH MICROSCOPIC
Bilirubin Urine: NEGATIVE
GLUCOSE, UA: NEGATIVE mg/dL
Hgb urine dipstick: NEGATIVE
Ketones, ur: 5 mg/dL — AB
NITRITE: NEGATIVE
PH: 5 (ref 5.0–8.0)
Protein, ur: 30 mg/dL — AB
Specific Gravity, Urine: 1.02 (ref 1.005–1.030)

## 2016-07-08 LAB — IRON AND TIBC
IRON: 22 ug/dL — AB (ref 28–170)
SATURATION RATIOS: 8 % — AB (ref 10.4–31.8)
TIBC: 295 ug/dL (ref 250–450)
UIBC: 273 ug/dL

## 2016-07-08 LAB — ECHOCARDIOGRAM COMPLETE
HEIGHTINCHES: 61 in
Weight: 4327 oz

## 2016-07-08 LAB — GLUCOSE, CAPILLARY
GLUCOSE-CAPILLARY: 273 mg/dL — AB (ref 65–99)
GLUCOSE-CAPILLARY: 167 mg/dL — AB (ref 65–99)
Glucose-Capillary: 258 mg/dL — ABNORMAL HIGH (ref 65–99)
Glucose-Capillary: 322 mg/dL — ABNORMAL HIGH (ref 65–99)

## 2016-07-08 LAB — LIPID PANEL
Cholesterol: 120 mg/dL (ref 0–200)
HDL: 43 mg/dL (ref >40)
LDL CALC: 69 mg/dL (ref 0–99)
TRIGLYCERIDES: 39 mg/dL (ref <150)
Total CHOL/HDL Ratio: 2.8 RATIO
VLDL: 8 mg/dL (ref 0–40)

## 2016-07-08 LAB — HEMOGLOBIN
Hemoglobin: 10.1 g/dL — ABNORMAL LOW (ref 12.0–16.0)
Hemoglobin: 10.6 g/dL — ABNORMAL LOW (ref 12.0–16.0)

## 2016-07-08 LAB — FERRITIN: FERRITIN: 21 ng/mL (ref 11–307)

## 2016-07-08 MED ORDER — ACETAMINOPHEN 160 MG/5ML PO SOLN: 650.0000 mg | ORAL | Status: DC | PRN

## 2016-07-08 MED ORDER — SODIUM CHLORIDE 0.9 % IV SOLN
INTRAVENOUS | Status: DC
  Administered 2016-07-08 (×2): via INTRAVENOUS

## 2016-07-08 MED ORDER — ONDANSETRON HCL 4 MG/2ML IJ SOLN
4.0000 mg | Freq: Once | INTRAMUSCULAR | Status: AC
  Administered 2016-07-08: 4 mg via INTRAVENOUS
  Filled 2016-07-08: qty 2

## 2016-07-08 MED ORDER — INSULIN ASPART 100 UNIT/ML ~~LOC~~ SOLN: 0.0000 [IU] | Freq: Every day | SUBCUTANEOUS | Status: DC

## 2016-07-08 MED ORDER — MAGNESIUM OXIDE 400 (241.3 MG) MG PO TABS
400.0000 mg | ORAL_TABLET | Freq: Every day | ORAL | Status: DC
  Administered 2016-07-08 – 2016-07-11 (×4): 400 mg via ORAL
  Filled 2016-07-08 (×4): qty 1

## 2016-07-08 MED ORDER — HYDROCHLOROTHIAZIDE 25 MG PO TABS
25.0000 mg | ORAL_TABLET | Freq: Every day | ORAL | Status: DC
  Administered 2016-07-08: 25 mg via ORAL
  Filled 2016-07-08: qty 1

## 2016-07-08 MED ORDER — METFORMIN HCL 500 MG PO TABS: 1000.0000 mg | ORAL_TABLET | Freq: Two times a day (BID) | ORAL | Status: DC

## 2016-07-08 MED ORDER — STROKE: EARLY STAGES OF RECOVERY BOOK
Freq: Once | Status: AC
  Administered 2016-07-08: 06:00:00

## 2016-07-08 MED ORDER — HEPARIN SODIUM (PORCINE) 5000 UNIT/ML IJ SOLN: 5000.0000 [IU] | Freq: Three times a day (TID) | INTRAMUSCULAR | Status: DC

## 2016-07-08 MED ORDER — CYCLOBENZAPRINE HCL 10 MG PO TABS: 10.0000 mg | ORAL_TABLET | Freq: Three times a day (TID) | ORAL | Status: DC | PRN

## 2016-07-08 MED ORDER — CLOPIDOGREL BISULFATE 75 MG PO TABS: 75.0000 mg | ORAL_TABLET | Freq: Every day | ORAL | Status: DC

## 2016-07-08 MED ORDER — LIDOCAINE 5 % EX PTCH
1.0000 | MEDICATED_PATCH | CUTANEOUS | Status: DC
  Administered 2016-07-08 – 2016-07-09 (×2): 1 via TRANSDERMAL
  Filled 2016-07-08 (×4): qty 1

## 2016-07-08 MED ORDER — ACETAMINOPHEN 650 MG RE SUPP: 650.0000 mg | RECTAL | Status: DC | PRN

## 2016-07-08 MED ORDER — PANTOPRAZOLE SODIUM 40 MG PO TBEC
40.0000 mg | DELAYED_RELEASE_TABLET | Freq: Every day | ORAL | Status: DC
  Administered 2016-07-08 – 2016-07-10 (×3): 40 mg via ORAL
  Filled 2016-07-08 (×3): qty 1

## 2016-07-08 MED ORDER — INSULIN ASPART PROT & ASPART (70-30 MIX) 100 UNIT/ML ~~LOC~~ SUSP: 25.0000 [IU] | Freq: Two times a day (BID) | SUBCUTANEOUS | Status: DC

## 2016-07-08 MED ORDER — HYDROCODONE-ACETAMINOPHEN 10-325 MG PO TABS
1.0000 | ORAL_TABLET | ORAL | Status: DC | PRN
  Administered 2016-07-08 (×2): 2 via ORAL
  Administered 2016-07-10: 22:00:00 1 via ORAL
  Filled 2016-07-08 (×2): qty 2
  Filled 2016-07-08: qty 1

## 2016-07-08 MED ORDER — INSULIN ASPART 100 UNIT/ML ~~LOC~~ SOLN
0.0000 [IU] | Freq: Three times a day (TID) | SUBCUTANEOUS | Status: DC
  Administered 2016-07-08: 18:00:00 7 [IU] via SUBCUTANEOUS
  Administered 2016-07-08 (×2): 5 [IU] via SUBCUTANEOUS
  Administered 2016-07-09 – 2016-07-10 (×3): 1 [IU] via SUBCUTANEOUS
  Administered 2016-07-10: 12:00:00 3 [IU] via SUBCUTANEOUS
  Administered 2016-07-11: 5 [IU] via SUBCUTANEOUS
  Filled 2016-07-08: qty 1
  Filled 2016-07-08 (×3): qty 5
  Filled 2016-07-08: qty 3
  Filled 2016-07-08 (×2): qty 1
  Filled 2016-07-08: qty 7

## 2016-07-08 MED ORDER — BACLOFEN 10 MG PO TABS: 20.0000 mg | ORAL_TABLET | Freq: Three times a day (TID) | ORAL | Status: DC

## 2016-07-08 MED ORDER — HYDROCODONE-ACETAMINOPHEN 5-325 MG PO TABS
1.0000 | ORAL_TABLET | Freq: Once | ORAL | Status: AC
  Administered 2016-07-08: 1 via ORAL
  Filled 2016-07-08: qty 1

## 2016-07-08 MED ORDER — ASPIRIN 325 MG PO TABS
325.0000 mg | ORAL_TABLET | Freq: Every day | ORAL | Status: DC
  Administered 2016-07-08: 325 mg via ORAL
  Filled 2016-07-08: qty 1

## 2016-07-08 MED ORDER — OXYBUTYNIN CHLORIDE 5 MG PO TABS
5.0000 mg | ORAL_TABLET | Freq: Three times a day (TID) | ORAL | Status: DC
  Administered 2016-07-08 – 2016-07-11 (×10): 5 mg via ORAL
  Filled 2016-07-08 (×10): qty 1

## 2016-07-08 MED ORDER — ATORVASTATIN CALCIUM 20 MG PO TABS
20.0000 mg | ORAL_TABLET | Freq: Every day | ORAL | Status: DC
  Administered 2016-07-08 – 2016-07-10 (×3): 20 mg via ORAL
  Filled 2016-07-08 (×3): qty 1

## 2016-07-08 MED ORDER — SODIUM CHLORIDE 0.9 % IV BOLUS (SEPSIS)
500.0000 mL | Freq: Once | INTRAVENOUS | Status: AC
  Administered 2016-07-08: 500 mL via INTRAVENOUS

## 2016-07-08 MED ORDER — PROMETHAZINE HCL 25 MG/ML IJ SOLN
12.5000 mg | Freq: Four times a day (QID) | INTRAMUSCULAR | Status: DC | PRN
  Administered 2016-07-08 – 2016-07-10 (×4): 12.5 mg via INTRAVENOUS
  Filled 2016-07-08 (×4): qty 1

## 2016-07-08 MED ORDER — NITROGLYCERIN 0.4 MG SL SUBL: 0.4000 mg | SUBLINGUAL_TABLET | SUBLINGUAL | Status: DC | PRN

## 2016-07-08 MED ORDER — ASPIRIN 300 MG RE SUPP: 300.0000 mg | Freq: Every day | RECTAL | Status: DC

## 2016-07-08 MED ORDER — AMLODIPINE BESYLATE 5 MG PO TABS
10.0000 mg | ORAL_TABLET | Freq: Every day | ORAL | Status: DC
  Administered 2016-07-08: 10:00:00 10 mg via ORAL
  Filled 2016-07-08: qty 2

## 2016-07-08 MED ORDER — SENNOSIDES-DOCUSATE SODIUM 8.6-50 MG PO TABS: 1.0000 | ORAL_TABLET | Freq: Every evening | ORAL | Status: DC | PRN

## 2016-07-08 MED ORDER — ENALAPRIL MALEATE 20 MG PO TABS
20.0000 mg | ORAL_TABLET | Freq: Two times a day (BID) | ORAL | Status: DC
  Filled 2016-07-08: qty 1

## 2016-07-08 MED ORDER — ACETAMINOPHEN 325 MG PO TABS: 650.0000 mg | ORAL_TABLET | ORAL | Status: DC | PRN

## 2016-07-08 MED ORDER — GABAPENTIN 100 MG PO CAPS
100.0000 mg | ORAL_CAPSULE | Freq: Three times a day (TID) | ORAL | Status: DC
  Administered 2016-07-08 – 2016-07-11 (×10): 100 mg via ORAL
  Filled 2016-07-08 (×10): qty 1

## 2016-07-08 MED ORDER — INSULIN ASPART PROT & ASPART (70-30 MIX) 100 UNIT/ML ~~LOC~~ SUSP
20.0000 [IU] | Freq: Two times a day (BID) | SUBCUTANEOUS | Status: DC
  Administered 2016-07-08 – 2016-07-10 (×4): 20 [IU] via SUBCUTANEOUS
  Filled 2016-07-08 (×4): qty 20

## 2016-07-08 MED ORDER — ASPIRIN 325 MG PO TABS
325.0000 mg | ORAL_TABLET | Freq: Every day | ORAL | Status: DC
  Filled 2016-07-08 (×2): qty 1
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
## 2016-07-08 NOTE — Progress Notes (Signed)
SLP Cancellation Note  Patient Details Name: Susan House MRN: [id number] DOB: 1955-11-21   Cancelled treatment:       Reason Eval/Treat Not Completed: SLP screened, no needs identified, will sign off (chart reviewed; NSG consulted)  Pt indicated she was at her baseline w/ speech and swallowing; no deficits stated. Pt was oriented x3 and followed general commands; also recalled what she had ordered for her breakfast meal. Encouraged general aspiration precautions w/ meals. NSG will reconsult if any change in status while admitted.    Orinda Kenner, MS, CCC-SLP Delshawn Stech 07/08/2016, 9:00 AM
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
## 2016-07-08 NOTE — ED Notes (Signed)
Pt. Helped to toilet, pt. Had small bowel movement.
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
## 2016-07-08 NOTE — ED Notes (Signed)
Pt. Helped to toilet, pt. Had loose bowel movement.

## 2016-07-08 NOTE — Evaluation (Signed)
Physical Therapy Evaluation Patient Details Name: Susan House MRN: [id number] DOB: 1956/01/28 Today's Date: 07/08/2016   History of Present Illness  60 y.o. female with a history of CVA with left-sided weakness, diabetes mellitus type 2, GERD, hyperlipidemia, hypertension, irritable bowel syndrome presented to the emergency room when she had the symptoms of not hearing she also could not find words or speak normally.   Clinical Impression  Pt is weak and generally limited, but she was actually able to do most mobility and some limited ambulation w/o direct assist.  She shows great effort in getting to sitting at EOB and despite chronic L sided weakness was able to do some in-room ambulation.  She did not have any overt LOBs or safety issues, but was weak and has some functional limitations.  Overall pt feels she will be able to go home with the assist that she has, will benefit from [place].    Follow Up Recommendations Home health PT    Equipment Recommendations       Recommendations for Other Services       Precautions / Restrictions Precautions Precautions: Fall Restrictions Weight Bearing Restrictions: No      Mobility  Bed Mobility Overal bed mobility: Modified Independent             General bed mobility comments: heavy, heavy use of rails to pull herself up - she does have a rail at home  Transfers Overall transfer level: Modified independent Equipment used: Rolling walker (2 wheeled)             General transfer comment: Pt is able to rise to standing w/o with direct assist, she has some hesitation, but ultimately did well and felt safe  Ambulation/Gait Ambulation/Gait assistance: Min guard Ambulation Distance (Feet): 35 Feet Assistive device: Rolling walker (2 wheeled)       General Gait Details: Pt with poor use of L hand on the walker and had some limp on L LE, but ultimately she was able to ambulate safely with limited in room distances w/ good  relative confidence  Stairs            Wheelchair Mobility    Modified Rankin (Stroke Patients Only)       Balance Overall balance assessment: Modified Independent (with walker)                                           Pertinent Vitals/Pain Pain Assessment:  (chronic low back pain, no acute issues)    Home Living Family/patient expects to be discharged to:: Private residence Living Arrangements: Spouse/significant other Available Help at Discharge: Family Type of Home: House Home Access: Ramped entrance     Home Layout: One level        Prior Function Level of Independence: Independent with assistive device(s)         Comments: additional time and AD needed; household distances only. Electronic cart or scooter/WC for community access.      Hand Dominance        Extremity/Trunk Assessment   Upper Extremity Assessment Upper Extremity Assessment: LUE deficits/detail LUE Deficits / Details: chronic L sided weakness from previou CVA, shld elevation to only ~70, poor grip and decreased strength t/o     Lower Extremity Assessment Lower Extremity Assessment: LLE deficits/detail LLE Deficits / Details: chronic L sided weakness, grossly 3/5 t/o  Communication   Communication: Expressive difficulties (some issues with word finding, reports better than yesterday)  Cognition Arousal/Alertness: Awake/alert Behavior During Therapy: WFL for tasks assessed/performed Overall Cognitive Status: Within Functional Limits for tasks assessed                      General Comments      Exercises     Assessment/Plan    PT Assessment Patient needs continued PT services  PT Problem List Decreased strength;Decreased range of motion;Decreased activity tolerance;Decreased balance;Decreased mobility;Decreased coordination;Decreased knowledge of use of DME;Decreased safety awareness;Obesity;Pain          PT Treatment Interventions DME  instruction;Gait training;Stair training;Functional mobility training;Therapeutic activities;Balance training;Therapeutic exercise;Neuromuscular re-education;Patient/family education    PT Goals (Current goals can be found in the Care Plan section)  Acute Rehab PT Goals Patient Stated Goal: go home PT Goal Formulation: With patient Time For Goal Achievement: 07/22/16 Potential to Achieve Goals: Fair    Frequency Min 2X/week   Barriers to discharge        Co-evaluation               End of Session Equipment Utilized During Treatment: Gait belt Activity Tolerance: Patient tolerated treatment well Patient left: with chair alarm set;with call bell/phone within reach Nurse Communication: Mobility status    Functional Assessment Tool Used: clinical judgement Functional Limitation: Mobility: Walking and moving around Mobility: Walking and Moving Around Current Status [phone]): At least 20 percent but less than 40 percent impaired, limited or restricted Mobility: Walking and Moving Around Goal Status [phone]): At least 1 percent but less than 20 percent impaired, limited or restricted    Time: 0858-0922 PT Time Calculation (min) (ACUTE ONLY): 24 min   Charges:   PT Evaluation $PT Eval Low Complexity: 1 Procedure     PT G Codes:   PT G-Codes **NOT FOR INPATIENT CLASS** Functional Assessment Tool Used: clinical judgement Functional Limitation: Mobility: Walking and moving around Mobility: Walking and Moving Around Current Status ([id number]): At least 20 percent but less than 40 percent impaired, limited or restricted Mobility: Walking and Moving Around Goal Status [phone]): At least 1 percent but less than 20 percent impaired, limited or restricted    Kreg Shropshire, DPT 07/08/2016, 10:18 AM

## 2016-07-09 LAB — GLUCOSE, CAPILLARY
GLUCOSE-CAPILLARY: 116 mg/dL — AB (ref 65–99)
GLUCOSE-CAPILLARY: 191 mg/dL — AB (ref 65–99)
Glucose-Capillary: 124 mg/dL — ABNORMAL HIGH (ref 65–99)
Glucose-Capillary: 150 mg/dL — ABNORMAL HIGH (ref 65–99)

## 2016-07-09 LAB — BASIC METABOLIC PANEL
ANION GAP: 6 (ref 5–15)
BUN: 41 mg/dL — ABNORMAL HIGH (ref 6–20)
CHLORIDE: 109 mmol/L (ref 101–111)
CO2: 24 mmol/L (ref 22–32)
Calcium: 8.5 mg/dL — ABNORMAL LOW (ref 8.9–10.3)
Creatinine, Ser: 1.09 mg/dL — ABNORMAL HIGH (ref 0.44–1.00)
GFR calc Af Amer: >60 mL/min (ref >60)
GFR, EST NON AFRICAN AMERICAN: 54 mL/min — AB (ref >60)
GLUCOSE: 92 mg/dL (ref 65–99)
POTASSIUM: 3.7 mmol/L (ref 3.5–5.1)
Sodium: 139 mmol/L (ref 135–145)

## 2016-07-09 LAB — HEMOGLOBIN
HEMOGLOBIN: 9.9 g/dL — AB (ref 12.0–16.0)
Hemoglobin: 10 g/dL — ABNORMAL LOW (ref 12.0–16.0)

## 2016-07-09 MED ORDER — SODIUM CHLORIDE 0.9 % IV SOLN
510.0000 mg | Freq: Once | INTRAVENOUS | Status: AC
  Administered 2016-07-09: 510 mg via INTRAVENOUS
  Filled 2016-07-09: qty 17

## 2016-07-09 MED ORDER — SODIUM CHLORIDE 0.9 % IV SOLN: INTRAVENOUS | Status: DC

## 2016-07-09 MED ORDER — PEG 3350-KCL-NA BICARB-NACL 420 G PO SOLR
4000.0000 mL | Freq: Once | ORAL | Status: AC
  Administered 2016-07-10: 4000 mL via ORAL
  Filled 2016-07-09: qty 4000

## 2016-07-09 NOTE — Progress Notes (Signed)
Occupational Therapy Treatment Patient Details Name: Susan House MRN: [id number] DOB: 13-Nov-1955 Today's Date: 07/09/2016    History of present illness 60 y.o. female with a history of CVA with left-sided weakness, diabetes mellitus type 2, GERD, hyperlipidemia, hypertension, irritable bowel syndrome presented to the emergency room when she had the symptoms of not hearing she also could not find words or speak normally.    OT comments  Patient seen this date for lower body dressing and toileting skills with min guard.  UB strength and ROM exercises performed in sitting with cues and therapist assist.  Will continue to work on improving strength and ability to perform basic self care tasks to return home.   Follow Up Recommendations       Equipment Recommendations       Recommendations for Other Services      Precautions / Restrictions Precautions Precautions: Fall Restrictions Weight Bearing Restrictions: No       Mobility Bed Mobility                  Transfers Overall transfer level: Needs assistance Equipment used: Rolling walker (2 wheeled) Transfers: Sit to/from Stand Sit to Stand: Min guard         General transfer comment: Patient ambulated to the bathroom and back with min guard and use of walker.     Balance                                   ADL Overall ADL's : Needs assistance/impaired     Grooming: Min guard;Wash/dry hands;Standing               Lower Body Dressing: Minimal assistance   Toilet Transfer: Min guard;Comfort height toilet   Toileting- Clothing Manipulation and Hygiene: Minimal assistance Toileting - Clothing Manipulation Details (indicate cue type and reason): min assist with depends undergarment              Vision                     Perception     Praxis      Cognition   Behavior During Therapy: WFL for tasks assessed/performed Overall Cognitive Status: Within Functional  Limits for tasks assessed                       Extremity/Trunk Assessment               Exercises Other Exercises Other Exercises: Patient seen for UE exercises for ROM and strengthening for 2 sets of 10 reps each ex for shoulder flexion, chest press, elbow flexion/extension, wrist flexion extension.    Shoulder Instructions       General Comments      Pertinent Vitals/ Pain       Pain Assessment: No/denies pain Pain Score: 0-No pain  Home Living                                          Prior Functioning/Environment              Frequency  Min 1X/week        Progress Toward Goals  OT Goals(current goals can now be found in the care plan section)  Progress towards OT goals: Progressing toward goals  Acute  Rehab OT Goals Patient Stated Goal: To be able to go home OT Goal Formulation: With patient Time For Goal Achievement: 07/22/16 Potential to Achieve Goals: Good  Plan Discharge plan remains appropriate    Co-evaluation                 End of Session Equipment Utilized During Treatment: Rolling walker;Gait belt   Activity Tolerance Patient tolerated treatment well   Patient Left in chair;with chair alarm set;with call bell/phone within reach;with family/visitor present   Nurse Communication          Time: 1130-1159 OT Time Calculation (min): 29 min  Charges: OT General Charges $OT Visit: 1 Procedure OT Treatments $Self Care/Home Management : 8-22 mins $Therapeutic Exercise: 8-22 mins Amy T Lovett, OTR/L, CLT  Lovett,Amy 07/09/2016, 12:21 PM

## 2016-07-09 NOTE — Progress Notes (Signed)
[facility] [place] NAME: Mercie Balsley    MR#:  [id number]  DATE OF BIRTH:  10/03/1955  SUBJECTIVE:  CHIEF COMPLAINT:   Chief Complaint  Patient presents with  . Weakness    Pt. states having weakness to her lt. side this evening.   The patient still has a little fresh rectal bleeding. REVIEW OF SYSTEMS:  Review of Systems  Constitutional: Positive for malaise/fatigue. Negative for chills and fever.  HENT: Negative for congestion and sore throat.   Eyes: Negative for blurred vision and double vision.  Respiratory: Negative for cough and shortness of breath.   Cardiovascular: Negative for chest pain and leg swelling.  Gastrointestinal: Positive for blood in stool. Negative for abdominal pain, constipation, diarrhea, melena, nausea and vomiting.  Genitourinary: Negative for dysuria and hematuria.  Musculoskeletal: Negative for joint pain.  Neurological: Positive for weakness. Negative for dizziness, focal weakness and loss of consciousness.  Psychiatric/Behavioral: Negative for depression. The patient is not nervous/anxious.     DRUG ALLERGIES:   Allergies  Allergen Reactions  . Morphine And Related Anaphylaxis  . Fentanyl Other (See Comments)    Unknown reaction.  . Reglan [Metoclopramide]     Elevated BP  . Simvastatin Other (See Comments)    Muscle pain  . Betadine [Povidone Iodine] Rash  . Iodine Rash  . Tetracyclines & Related Rash   VITALS:  Blood pressure (!) 119/38, pulse 80, temperature 98.3 F (36.8 C), temperature source Oral, resp. rate (!) 21, height 5\' 1"  (1.549 m), weight 270 lb 7 oz (122.7 kg), SpO2 93 %. PHYSICAL EXAMINATION:  Physical Exam  Constitutional: She is oriented to person, place, and time.  Morbid obesity.  HENT:  Head: Normocephalic.  Mouth/Throat: Oropharynx is clear and moist.  Eyes: Conjunctivae and EOM are normal. No scleral icterus.  Neck: Normal range of motion. Neck supple. No JVD  present. No tracheal deviation present.  Cardiovascular: Normal rate, regular rhythm and normal heart sounds.   No murmur heard. Pulmonary/Chest: Effort normal and breath sounds normal. No respiratory distress. She has no wheezes. She has no rales.  Abdominal: Soft. Bowel sounds are normal. She exhibits no distension. There is no tenderness. There is no rebound.  Musculoskeletal: Normal range of motion. She exhibits no edema or tenderness.  Neurological: She is alert and oriented to person, place, and time. No cranial nerve deficit.  Skin: No rash noted. No erythema.  Psychiatric: Affect normal.   LABORATORY PANEL:   CBC  Recent Labs Lab 07/07/16 2308  07/09/16 0547  WBC 9.3  --   --   HGB 11.9*  < > 9.9*  HCT 36.6  --   --   PLT 261  --   --   < > = values in this interval not displayed. ------------------------------------------------------------------------------------------------------------------ Chemistries   Recent Labs Lab 07/08/16 2356  NA 139  K 3.7  CL 109  CO2 24  GLUCOSE 92  BUN 41*  CREATININE 1.09*  CALCIUM 8.5*   RADIOLOGY:  Mr Brain Wo Contrast  Result Date: 07/08/2016 CLINICAL DATA:  60 year old hypertensive diabetic female with history of prior infarct resulting in left-sided weakness. Recent difficulty finding words lasting for 2 hours, now back to baseline. Subsequent encounter. EXAM: MRI HEAD WITHOUT CONTRAST MRA HEAD WITHOUT CONTRAST TECHNIQUE: Multiplanar, multiecho pulse sequences of the brain and surrounding structures were obtained without intravenous contrast. Angiographic images of the head were obtained using MRA technique without contrast. COMPARISON:  07/08/2016  CT. 12/28/2015 MR. 01/18/2014 MR and MR angiogram. FINDINGS: MRI HEAD FINDINGS Brain: Exam is motion degraded. No acute infarct or intracranial hemorrhage. Remote left basal ganglia/corona radiata infarct with chronically dilated left lateral ventricle and wallerian degeneration.  Remote right basal ganglia/posterior limb right internal capsule infarct. Global atrophy with ventricular prominence felt be related to the atrophy and prior infarct rather than hydrocephalus and unchanged. No intracranial mass lesion noted on this unenhanced exam. Increased signal in the pituitary region on diffusion sequence unchanged from prior exams and of indeterminate significance/etiology. Vascular: Major intracranial vascular structures are patent. Skull and upper cervical spine: No acute abnormality. Sinuses/Orbits: Post lens replacement without acute orbital abnormality. Minimal mucosal thickening paranasal sinuses. Other: Negative MRA HEAD FINDINGS Motion degradation limits evaluation for grading stenosis accurately or detecting aneurysm. Flow noted within the distal vertebral arteries and basilar artery. Findings suggest significant atherosclerotic changes of the posterior cerebral arteries and superior cerebral arteries greater on the right. Nonvisualized anterior inferior cerebellar arteries and poor delineation of majority of the posterior inferior cerebellar artery bilaterally. Anterior circulation with flow within major vessels. Limited for evaluating for stenosis although prominent intracranial atherosclerotic type changes suspected. IMPRESSION: MRI HEAD Exam is motion degraded. No acute infarct or intracranial hemorrhage. Remote left basal ganglia/corona radiata infarct with chronically dilated left lateral ventricle and wallerian degeneration. Remote right basal ganglia/posterior limb right internal capsule infarct. Global atrophy with ventricular prominence felt be related to the atrophy and prior infarct rather than hydrocephalus and unchanged. MRA HEAD Motion degradation limits evaluation for grading stenosis accurately or detecting aneurysm. Flow noted within the distal vertebral arteries and basilar artery. Findings suggest significant atherosclerotic changes of the posterior cerebral  arteries and superior cerebral arteries greater on the right. Nonvisualized anterior inferior cerebellar arteries and poor delineation of majority of the posterior inferior cerebellar artery bilaterally. Anterior circulation with flow within major vessels. Limited for evaluating for stenosis although prominent intracranial atherosclerotic type changes suspected. Electronically Signed   By: Genia Del M.D.   On: 07/08/2016 15:24   Mr Jodene Nam Head/brain DJ Cm  Result Date: 07/08/2016 CLINICAL DATA:  60 year old hypertensive diabetic female with history of prior infarct resulting in left-sided weakness. Recent difficulty finding words lasting for 2 hours, now back to baseline. Subsequent encounter. EXAM: MRI HEAD WITHOUT CONTRAST MRA HEAD WITHOUT CONTRAST TECHNIQUE: Multiplanar, multiecho pulse sequences of the brain and surrounding structures were obtained without intravenous contrast. Angiographic images of the head were obtained using MRA technique without contrast. COMPARISON:  07/08/2016 CT. 12/28/2015 MR. 01/18/2014 MR and MR angiogram. FINDINGS: MRI HEAD FINDINGS Brain: Exam is motion degraded. No acute infarct or intracranial hemorrhage. Remote left basal ganglia/corona radiata infarct with chronically dilated left lateral ventricle and wallerian degeneration. Remote right basal ganglia/posterior limb right internal capsule infarct. Global atrophy with ventricular prominence felt be related to the atrophy and prior infarct rather than hydrocephalus and unchanged. No intracranial mass lesion noted on this unenhanced exam. Increased signal in the pituitary region on diffusion sequence unchanged from prior exams and of indeterminate significance/etiology. Vascular: Major intracranial vascular structures are patent. Skull and upper cervical spine: No acute abnormality. Sinuses/Orbits: Post lens replacement without acute orbital abnormality. Minimal mucosal thickening paranasal sinuses. Other: Negative MRA HEAD  FINDINGS Motion degradation limits evaluation for grading stenosis accurately or detecting aneurysm. Flow noted within the distal vertebral arteries and basilar artery. Findings suggest significant atherosclerotic changes of the posterior cerebral arteries and superior cerebral arteries greater on the right. Nonvisualized anterior inferior cerebellar arteries  and poor delineation of majority of the posterior inferior cerebellar artery bilaterally. Anterior circulation with flow within major vessels. Limited for evaluating for stenosis although prominent intracranial atherosclerotic type changes suspected. IMPRESSION: MRI HEAD Exam is motion degraded. No acute infarct or intracranial hemorrhage. Remote left basal ganglia/corona radiata infarct with chronically dilated left lateral ventricle and wallerian degeneration. Remote right basal ganglia/posterior limb right internal capsule infarct. Global atrophy with ventricular prominence felt be related to the atrophy and prior infarct rather than hydrocephalus and unchanged. MRA HEAD Motion degradation limits evaluation for grading stenosis accurately or detecting aneurysm. Flow noted within the distal vertebral arteries and basilar artery. Findings suggest significant atherosclerotic changes of the posterior cerebral arteries and superior cerebral arteries greater on the right. Nonvisualized anterior inferior cerebellar arteries and poor delineation of majority of the posterior inferior cerebellar artery bilaterally. Anterior circulation with flow within major vessels. Limited for evaluating for stenosis although prominent intracranial atherosclerotic type changes suspected. Electronically Signed   By: Genia Del M.D.   On: 07/08/2016 15:24   ASSESSMENT AND PLAN:   Rectal bleeding. Hold aspirin and Plavix, hemoglobin every 6 hours, GI consult. Per GI consult, Ok with clear liquid diet. Colonoscopy Tuesday or sooner if she continues to bleed or Hb  drops.  Symptomatic anemia due to acute blood loss. Hemoglobin decreased from 11.9 to 9.9.  PRBC transfusion when necessary. Follow-up hemoglobin.  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 Hold aspirin and Plavix, continue Lipitor. Follow-up PT and OT evaluation.  Carotid duplex showed 50-69% stenosis in the right internal carotid artery. Less than 50% stenosis in the left internal carotid artery. Echo: No cardiac source of emboli was identified, EF 60-65%.No acute CVA per MRI/MRA of brain.  ARF.  Improved with Fluid support. Hold vasotec and HCTZ. Hypertension. Hold hypertension medication due to low side blood pressure.  Diabetes. Continue sliding scale and Novolog 70/30. Dictation #1 [id number]  [id number]  Her PT evaluation, the patient needs home health and PT. All the records are reviewed and case discussed with Care Management/Social Worker. Management plans discussed with the patient, family and they are in agreement.  CODE STATUS: Full code  TOTAL TIME TAKING CARE OF THIS PATIENT: 36 minutes.   More than 50% of the time was spent in counseling/coordination of care: YES  POSSIBLE D/C IN 3 DAYS, DEPENDING ON CLINICAL CONDITION.   Demetrios Loll M.D on 07/09/2016 at 12:28 PM  Between 7am to 6pm - Pager - [phone]  After 6pm go to [email or web address] - password [email or web address] [facility]  [facility]  Office  [phone]  CC: Primary care physician; Wilhemena Durie, MD  Note: This dictation was prepared with Dragon dictation along with smaller phrase technology. Any transcriptional errors that result from this process are unintentional.
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
## 2016-07-09 NOTE — Care Management Obs Status (Signed)
[facility] NOTIFICATION   Patient Details  Name: Loeta Herst MRN: [id number] Date of Birth: 1956-02-18   Medicare Observation Status Notification Given:  Yes    Ival Bible, RN 07/09/2016, 10:05 AM

## 2016-07-10 DIAGNOSIS — K921 Melena: Secondary | ICD-10-CM | POA: Diagnosis not present

## 2016-07-10 DIAGNOSIS — K559 Vascular disorder of intestine, unspecified: Secondary | ICD-10-CM | POA: Diagnosis not present

## 2016-07-10 LAB — BASIC METABOLIC PANEL
Anion gap: 7 (ref 5–15)
BUN: 20 mg/dL (ref 6–20)
CHLORIDE: 108 mmol/L (ref 101–111)
CO2: 25 mmol/L (ref 22–32)
CREATININE: 0.74 mg/dL (ref 0.44–1.00)
Calcium: 8.7 mg/dL — ABNORMAL LOW (ref 8.9–10.3)
GFR calc non Af Amer: >60 mL/min (ref >60)
Glucose, Bld: 183 mg/dL — ABNORMAL HIGH (ref 65–99)
POTASSIUM: 3.6 mmol/L (ref 3.5–5.1)
SODIUM: 140 mmol/L (ref 135–145)

## 2016-07-10 LAB — CBC
HCT: 30 % — ABNORMAL LOW (ref 35.0–47.0)
Hemoglobin: 10.1 g/dL — ABNORMAL LOW (ref 12.0–16.0)
MCH: 28 pg (ref 26.0–34.0)
MCHC: 33.5 g/dL (ref 32.0–36.0)
MCV: 83.6 fL (ref 80.0–100.0)
PLATELETS: 199 10*3/uL (ref 150–440)
RBC: 3.59 MIL/uL — AB (ref 3.80–5.20)
RDW: 15.7 % — ABNORMAL HIGH (ref 11.5–14.5)
WBC: 9 10*3/uL (ref 3.6–11.0)

## 2016-07-10 LAB — GLUCOSE, CAPILLARY
GLUCOSE-CAPILLARY: 141 mg/dL — AB (ref 65–99)
GLUCOSE-CAPILLARY: 189 mg/dL — AB (ref 65–99)
Glucose-Capillary: 173 mg/dL — ABNORMAL HIGH (ref 65–99)
Glucose-Capillary: 206 mg/dL — ABNORMAL HIGH (ref 65–99)

## 2016-07-10 MED ORDER — ONDANSETRON HCL 4 MG/2ML IJ SOLN
4.0000 mg | Freq: Four times a day (QID) | INTRAMUSCULAR | Status: DC | PRN
  Administered 2016-07-10: 4 mg via INTRAVENOUS
  Filled 2016-07-10: qty 2

## 2016-07-10 NOTE — Progress Notes (Signed)
Susan Lame, MD [facility]   [address] [place], [place] [address] Phone: [phone] Fax : [phone]   Subjective: This patient was admitted with rectal bleeding. The patient states that the leading has stopped considerably. The patient states she had a colonoscopy by me in the past and for my records it appears that it was in 2008. The patient now reports that she is feeling better without any abdominal pain. She also had 2 bowel movements with a small amount of clots. The patient's hemoglobin has been stable.   Objective: Vital signs in last 24 hours: Vitals:   07/09/16 2030 07/10/16 0010 07/10/16 0427 07/10/16 0834  BP: (!) 138/46 (!) 145/64 (!) 128/48 (!) 132/53  Pulse: 71 95 75 80  Resp: (!) 23 (!) 23 20 18   Temp: 98.7 F (37.1 C) 99.7 F (37.6 C) 98.4 F (36.9 C) 98.6 F (37 C)  TempSrc: Oral Oral Oral Oral  SpO2: 95% 97% 95% 95%  Weight:      Height:       Weight change:   Intake/Output Summary (Last 24 hours) at 07/10/16 1121 Last data filed at 07/09/16 1806  Gross per 24 hour  Intake             1020 ml  Output                0 ml  Net             1020 ml     Exam: Heart:: Regular rate and rhythm, S1S2 present or without murmur or extra heart sounds Lungs: normal and clear to auscultation and percussion Abdomen: soft, nontender, normal bowel sounds   Lab Results: @LABTEST2 @ Micro Results: No results found for this or any previous visit (from the past 240 hour(s)). 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 Recent difficulty finding words lasting for 2 hours, now back to baseline. Subsequent encounter. EXAM: MRI HEAD WITHOUT CONTRAST MRA HEAD WITHOUT CONTRAST TECHNIQUE: Multiplanar, multiecho pulse sequences of the brain and surrounding structures were obtained without intravenous contrast. Angiographic  images of the head were obtained using MRA technique without contrast. COMPARISON:  07/08/2016 CT. 12/28/2015 MR. 01/18/2014 MR and MR angiogram. FINDINGS: MRI HEAD FINDINGS Brain: Exam is motion degraded. No acute infarct or intracranial hemorrhage. Remote left basal ganglia/corona radiata infarct with chronically dilated left lateral ventricle and wallerian degeneration. Remote right basal ganglia/posterior limb right internal capsule infarct. Global atrophy with ventricular prominence felt be related to the atrophy and prior infarct rather than hydrocephalus and unchanged. No intracranial mass lesion noted on this unenhanced exam. Increased signal in the pituitary region on diffusion sequence unchanged from prior exams and of indeterminate significance/etiology. Vascular: Major intracranial vascular structures are patent. Skull and upper cervical spine: No acute abnormality. Sinuses/Orbits: Post lens replacement without acute orbital abnormality. Minimal mucosal thickening paranasal sinuses. Other: Negative MRA HEAD FINDINGS Motion degradation limits evaluation for grading stenosis accurately or detecting aneurysm. Flow noted within the distal vertebral arteries and basilar artery. Findings suggest significant atherosclerotic changes of the posterior cerebral arteries and superior cerebral arteries greater on the right. Nonvisualized anterior inferior cerebellar arteries and poor delineation of majority of the posterior inferior cerebellar artery bilaterally. Anterior circulation with flow within major vessels. Limited for evaluating for stenosis although prominent intracranial atherosclerotic type changes suspected. IMPRESSION: MRI HEAD Exam is motion degraded. No acute infarct or intracranial  hemorrhage. Remote left basal ganglia/corona radiata infarct with chronically dilated left lateral ventricle and wallerian degeneration. Remote right basal ganglia/posterior limb right internal capsule infarct. Global  atrophy with ventricular prominence felt be related to the atrophy and prior infarct rather than hydrocephalus and unchanged. MRA HEAD Motion degradation limits evaluation for grading stenosis accurately or detecting aneurysm. Flow noted within the distal vertebral arteries and basilar artery. Findings suggest significant atherosclerotic changes of the posterior cerebral arteries and superior cerebral arteries greater on the right. Nonvisualized anterior inferior cerebellar arteries and poor delineation of majority of the posterior inferior cerebellar artery bilaterally. Anterior circulation with flow within major vessels. Limited for evaluating for stenosis although prominent intracranial atherosclerotic type changes suspected. Electronically Signed   By: Genia Del M.D.   On: 07/08/2016 15:24   US Carotid Bilateral (at [facility] Only)  Result Date: 07/08/2016 CLINICAL DATA:  TIA EXAM: BILATERAL CAROTID DUPLEX ULTRASOUND TECHNIQUE: Pearline Cables scale imaging, color Doppler and duplex ultrasound were performed of bilateral carotid and vertebral arteries in the neck. COMPARISON:  None. FINDINGS: Criteria: Quantification of carotid stenosis is based on velocity parameters that correlate the residual internal carotid diameter with NASCET-based stenosis levels, using the diameter of the distal internal carotid lumen as the denominator for stenosis measurement. The following velocity measurements were obtained: RIGHT ICA:  199 cm/sec CCA:  70 cm/sec SYSTOLIC ICA/CCA RATIO:  2.8 DIASTOLIC ICA/CCA RATIO:  1.3 ECA:  180 cm/sec LEFT ICA:  94 cm/sec CCA:  56 cm/sec SYSTOLIC ICA/CCA RATIO:  1.7 DIASTOLIC ICA/CCA RATIO:  2.0 ECA:  129 cm/sec RIGHT CAROTID ARTERY: Extensive calcified plaque in the bulb. Low resistance internal carotid Doppler pattern. RIGHT VERTEBRAL ARTERY:  Antegrade. LEFT CAROTID ARTERY: Moderate calcified plaque in the left bulb. Low resistance internal carotid Doppler pattern. LEFT VERTEBRAL ARTERY:   Antegrade. 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 Less than 50% stenosis in the left internal carotid artery Electronically Signed   By: Marybelle Killings M.D.   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 Recent difficulty finding words lasting for 2 hours, now back to baseline. Subsequent encounter. EXAM: MRI HEAD WITHOUT CONTRAST MRA HEAD WITHOUT CONTRAST TECHNIQUE: Multiplanar, multiecho pulse sequences of the brain and surrounding structures were obtained without intravenous contrast. Angiographic images of the head were obtained using MRA technique without contrast. COMPARISON:  07/08/2016 CT. 12/28/2015 MR. 01/18/2014 MR and MR angiogram. FINDINGS: MRI HEAD FINDINGS Brain: Exam is motion degraded. No acute infarct or intracranial hemorrhage. Remote left basal ganglia/corona radiata infarct with chronically dilated left lateral ventricle and wallerian degeneration. Remote right basal ganglia/posterior limb right internal capsule infarct. Global atrophy with ventricular prominence felt be related to the atrophy and prior infarct rather than hydrocephalus and unchanged. No intracranial mass lesion noted on this unenhanced exam. Increased signal in the pituitary region on diffusion sequence unchanged from prior exams and of indeterminate significance/etiology. Vascular: Major intracranial vascular structures are patent. Skull and upper cervical spine: No acute abnormality. Sinuses/Orbits: Post lens replacement without acute orbital abnormality. Minimal mucosal thickening paranasal sinuses. Other: Negative MRA HEAD FINDINGS Motion degradation limits evaluation for grading stenosis accurately or detecting aneurysm. Flow noted within the distal vertebral arteries and basilar artery. Findings suggest significant atherosclerotic changes of the  posterior cerebral arteries and superior cerebral arteries greater on the right. Nonvisualized anterior inferior cerebellar arteries and poor delineation  of majority of the posterior inferior cerebellar artery bilaterally. Anterior circulation with flow within major vessels. Limited for evaluating for stenosis although prominent intracranial atherosclerotic type changes suspected. IMPRESSION: MRI HEAD Exam is motion degraded. No acute infarct or intracranial hemorrhage. Remote left basal ganglia/corona radiata infarct with chronically dilated left lateral ventricle and wallerian degeneration. Remote right basal ganglia/posterior limb right internal capsule infarct. Global atrophy with ventricular prominence felt be related to the atrophy and prior infarct rather than hydrocephalus and unchanged. MRA HEAD Motion degradation limits evaluation for grading stenosis accurately or detecting aneurysm. Flow noted within the distal vertebral arteries and basilar artery. Findings suggest significant atherosclerotic changes of the posterior cerebral arteries and superior cerebral arteries greater on the right. Nonvisualized anterior inferior cerebellar arteries and poor delineation of majority of the posterior inferior cerebellar artery bilaterally. Anterior circulation with flow within major vessels. Limited for evaluating for stenosis although prominent intracranial atherosclerotic type changes suspected. Electronically Signed   By: Genia Del M.D.   On: 07/08/2016 15:24   Medications: I have reviewed the patient's current medications. Scheduled Meds: . atorvastatin  20 mg Oral QHS  . gabapentin  100 mg Oral TID  . insulin aspart  0-5 Units Subcutaneous QHS  . insulin aspart  0-9 Units Subcutaneous TID WC  . insulin aspart protamine- aspart  20 Units Subcutaneous BID WC  . lidocaine  1 patch Transdermal Q24H  . magnesium oxide  400 mg Oral Daily  . oxybutynin  5 mg Oral TID  . pantoprazole  40 mg Oral QAC  breakfast  . polyethylene glycol-electrolytes  4,000 mL Oral Once   Continuous Infusions: . sodium chloride Stopped (07/09/16 1700)   PRN Meds:.acetaminophen **OR** acetaminophen (TYLENOL) oral liquid 160 mg/5 mL **OR** acetaminophen, cyclobenzaprine, HYDROcodone-acetaminophen, nitroGLYCERIN, ondansetron (ZOFRAN) IV, promethazine, senna-docusate   Assessment: Active Problems:   TIA (transient ischemic attack)    Plan: This patient was admitted with rectal bleeding. The patient has never had rectal bleeding before. The patient will be given a prep for her colonoscopy to be done tomorrow. The patient has been explained the procedure. The patient's likely diagnosis is hemorrhoidal bleeding versus diverticular bleeding. Less likely the patient may have a neoplasm causing her bleeding.   LOS: 0 days   Susan House 07/10/2016, 11:21 AM

## 2016-07-10 NOTE — Progress Notes (Signed)
Reading at [place] NAME: Shalon Salado    MR#:  [id number]  DATE OF BIRTH:  May 31, 1956  SUBJECTIVE:  CHIEF COMPLAINT:   Chief Complaint  Patient presents with  . Weakness    Pt. states having weakness to her lt. side this evening.   The patient still has a little fresh rectal bleeding. REVIEW OF SYSTEMS:  Review of Systems  Constitutional: Positive for malaise/fatigue. Negative for chills and fever.  HENT: Negative for congestion and sore throat.   Eyes: Negative for blurred vision and double vision.  Respiratory: Negative for cough and shortness of breath.   Cardiovascular: Negative for chest pain and leg swelling.  Gastrointestinal: Positive for blood in stool. Negative for abdominal pain, constipation, diarrhea, melena, nausea and vomiting.  Genitourinary: Negative for dysuria and hematuria.  Musculoskeletal: Negative for joint pain.  Neurological: Negative for dizziness, focal weakness, loss of consciousness and weakness.  Psychiatric/Behavioral: Negative for depression. The patient is not nervous/anxious.     DRUG ALLERGIES:   Allergies  Allergen Reactions  . Morphine And Related Anaphylaxis  . Fentanyl Other (See Comments)    Unknown reaction.  . Reglan [Metoclopramide]     Elevated BP  . Simvastatin Other (See Comments)    Muscle pain  . Betadine [Povidone Iodine] Rash  . Iodine Rash  . Tetracyclines & Related Rash   VITALS:  Blood pressure (!) 132/53, pulse 80, temperature 98.6 F (37 C), temperature source Oral, resp. rate 18, height 5\' 1"  (1.549 m), weight 270 lb 7 oz (122.7 kg), SpO2 95 %. PHYSICAL EXAMINATION:  Physical Exam  Constitutional: She is oriented to person, place, and time.  Morbid obesity.  HENT:  Head: Normocephalic.  Mouth/Throat: Oropharynx is clear and moist.  Eyes: Conjunctivae and EOM are normal. No scleral icterus.  Neck: Normal range of motion. Neck supple. No JVD present. No tracheal  deviation present.  Cardiovascular: Normal rate, regular rhythm and normal heart sounds.   No murmur heard. Pulmonary/Chest: Effort normal and breath sounds normal. No respiratory distress. She has no wheezes. She has no rales.  Abdominal: Soft. Bowel sounds are normal. She exhibits no distension. There is no tenderness. There is no rebound.  Musculoskeletal: Normal range of motion. She exhibits no edema or tenderness.  Neurological: She is alert and oriented to person, place, and time. No cranial nerve deficit.  Skin: No rash noted. No erythema.  Psychiatric: Affect normal.   LABORATORY PANEL:   CBC  Recent Labs Lab 07/10/16 0336  WBC 9.0  HGB 10.1*  HCT 30.0*  PLT 199   ------------------------------------------------------------------------------------------------------------------ Chemistries   Recent Labs Lab 07/10/16 0336  NA 140  K 3.6  CL 108  CO2 25  GLUCOSE 183*  BUN 20  CREATININE 0.74  CALCIUM 8.7*   RADIOLOGY:  No results found. ASSESSMENT AND PLAN:   Rectal bleeding. Hold aspirin and Plavix, hemoglobin is stable, follow up in am. Per GI consult, Ok with clear liquid diet. Colonoscopy Tuesday or sooner if she continues to bleed or Hb drops.  Symptomatic anemia due to acute blood loss. Hemoglobin decreased from 11.9 to 10.1.  PRBC transfusion when necessary. Follow-up hemoglobin.  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 Hold aspirin and Plavix, continue Lipitor. Follow-up PT and OT evaluation.  Carotid duplex showed 50-69% stenosis in the right internal carotid artery. Less than 50% stenosis in the left internal carotid artery. Echo: No cardiac source of emboli was identified, EF 60-65%.No  acute CVA per MRI/MRA of brain.  ARF.  Improved with Fluid support. Hold vasotec and HCTZ. Hypertension. Hold hypertension medication due to low side blood pressure.  Diabetes. Continue sliding scale and Novolog 70/30. Dictation #1 [id number]   [id number]  Her PT evaluation, the patient needs home health and PT. All the records are reviewed and case discussed with Care Management/Social Worker. Management plans discussed with the patient, her husband and they are in agreement.  CODE STATUS: Full code  TOTAL TIME TAKING CARE OF THIS PATIENT: 36 minutes.   More than 50% of the time was spent in counseling/coordination of care: YES  POSSIBLE D/C IN 1-2 DAYS, DEPENDING ON CLINICAL CONDITION.   Demetrios Loll M.D on 07/10/2016 at 11:46 AM  Between 7am to 6pm - Pager - [phone]  After 6pm go to [email or web address] - password [email or web address] [facility]  [facility]  Office  [phone]  CC: Primary care physician; Wilhemena Durie, MD  Note: This dictation was prepared with Dragon dictation along with smaller phrase technology. Any transcriptional errors that result from this process are unintentional.

## 2016-07-11 ENCOUNTER — Observation Stay: Payer: Commercial Managed Care - HMO | Admitting: Anesthesiology

## 2016-07-11 ENCOUNTER — Telehealth: Payer: Self-pay

## 2016-07-11 ENCOUNTER — Encounter
Admission: EM | Disposition: A | Discharge status: Home / Self Care | Payer: Self-pay | Source: Home / Self Care | Attending: Emergency Medicine

## 2016-07-11 DIAGNOSIS — K559 Vascular disorder of intestine, unspecified: Secondary | ICD-10-CM | POA: Diagnosis not present

## 2016-07-11 DIAGNOSIS — K921 Melena: Secondary | ICD-10-CM | POA: Diagnosis not present

## 2016-07-11 HISTORY — PX: COLONOSCOPY WITH PROPOFOL: SHX5780

## 2016-07-11 LAB — CBC
HCT: 31.3 % — ABNORMAL LOW (ref 35.0–47.0)
HEMOGLOBIN: 10.6 g/dL — AB (ref 12.0–16.0)
MCH: 27.8 pg (ref 26.0–34.0)
MCHC: 33.8 g/dL (ref 32.0–36.0)
MCV: 82.4 fL (ref 80.0–100.0)
PLATELETS: 217 10*3/uL (ref 150–440)
RBC: 3.81 MIL/uL (ref 3.80–5.20)
RDW: 15.5 % — AB (ref 11.5–14.5)
WBC: 6.4 10*3/uL (ref 3.6–11.0)

## 2016-07-11 LAB — GLUCOSE, CAPILLARY
GLUCOSE-CAPILLARY: 253 mg/dL — AB (ref 65–99)
Glucose-Capillary: 176 mg/dL — ABNORMAL HIGH (ref 65–99)

## 2016-07-11 SURGERY — COLONOSCOPY WITH PROPOFOL
Anesthesia: General

## 2016-07-11 MED ORDER — FENTANYL CITRATE (PF) 100 MCG/2ML IJ SOLN: 25.0000 ug | INTRAMUSCULAR | Status: DC | PRN

## 2016-07-11 MED ORDER — PROPOFOL 500 MG/50ML IV EMUL
INTRAVENOUS | Status: AC
  Filled 2016-07-11: qty 50

## 2016-07-11 MED ORDER — PROPOFOL 10 MG/ML IV BOLUS
INTRAVENOUS | Status: DC | PRN
  Administered 2016-07-11: 80 mg via INTRAVENOUS

## 2016-07-11 MED ORDER — ONDANSETRON HCL 4 MG/2ML IJ SOLN: 4.0000 mg | Freq: Once | INTRAMUSCULAR | Status: DC | PRN

## 2016-07-11 MED ORDER — PROPOFOL 500 MG/50ML IV EMUL
INTRAVENOUS | Status: DC | PRN
  Administered 2016-07-11: 120 ug/kg/min via INTRAVENOUS

## 2016-07-11 MED ORDER — SODIUM CHLORIDE 0.9 % IV SOLN
INTRAVENOUS | Status: DC
  Administered 2016-07-11: 08:00:00 via INTRAVENOUS

## 2016-07-11 NOTE — Progress Notes (Signed)
Patient returned to room at 0910 following colonoscopy. No complaints of pain. Madlyn Frankel, RN

## 2016-07-11 NOTE — Telephone Encounter (Signed)
Patient has appointment for hospital f/u on 07/20/16 at 3 pm, for TIA-aa

## 2016-07-11 NOTE — Addendum Note (Signed)
Addendum  created 07/11/16 3795 by Gunnar Fusi, MD   Order list changed, Order sets accessed

## 2016-07-11 NOTE — Progress Notes (Signed)
Patient transported to special procedures. Madlyn Frankel, RN

## 2016-07-11 NOTE — Progress Notes (Signed)
Consent signed for colonoscopy. Madlyn Frankel, RN

## 2016-07-11 NOTE — Progress Notes (Signed)
Discharge instructions given and went over with patient at bedside. All questions answered. Patient discharged home with husband via wheelchair by nursing staff. Madlyn Frankel, RN

## 2016-07-11 NOTE — Transfer of Care (Signed)
Immediate Anesthesia Transfer of Care Note  Patient: Susan House  Procedure(s) Performed: Procedure(s): COLONOSCOPY WITH PROPOFOL (N/A)  Patient Location: PACU  Anesthesia Type:General  Level of Consciousness: awake, alert  and oriented  Airway & Oxygen Therapy: Patient Spontanous Breathing and Patient connected to nasal cannula oxygen  Post-op Assessment: Report given to RN and Post -op Vital signs reviewed and stable  Post vital signs: Reviewed and stable  Last Vitals:  Vitals:   07/11/16 0440 07/11/16 0824  BP: (!) 175/70 110/83  Pulse: (!) 109 87  Resp: 20 13  Temp: 36.6 C 36.3 C    Last Pain:  Vitals:   07/11/16 0440  TempSrc: Oral  PainSc:          Complications: No apparent anesthesia complications

## 2016-07-11 NOTE — Anesthesia Preprocedure Evaluation (Signed)
Anesthesia Evaluation  Patient identified by MRN, date of birth, ID band Patient awake    Reviewed: Allergy & Precautions, NPO status , Patient's Chart, lab work & pertinent test results  History of Anesthesia Complications Negative for: history of anesthetic complications  Airway Mallampati: II       Dental  (+) Upper Dentures, Partial Lower   Pulmonary neg pulmonary ROS,           Cardiovascular hypertension, Pt. on medications + angina      Neuro/Psych Anxiety Depression TIACVA (left sided weakness, peripheral vision), Residual Symptoms    GI/Hepatic GERD  Medicated and Controlled,  Endo/Other  diabetes, Type 2, Oral Hypoglycemic AgentsHypothyroidism   Renal/GU Renal InsufficiencyRenal disease     Musculoskeletal   Abdominal   Peds  Hematology  (+) anemia ,   Anesthesia Other Findings   Reproductive/Obstetrics                             Anesthesia Physical Anesthesia Plan  ASA: III  Anesthesia Plan: General   Post-op Pain Management:    Induction: Intravenous  Airway Management Planned: Nasal Cannula  Additional Equipment:   Intra-op Plan:   Post-operative Plan:   Informed Consent: I have reviewed the patients History and Physical, chart, labs and discussed the procedure including the risks, benefits and alternatives for the proposed anesthesia with the patient or authorized representative who has indicated his/her understanding and acceptance.     Plan Discussed with:   Anesthesia Plan Comments:         Anesthesia Quick Evaluation

## 2016-07-11 NOTE — Discharge Summary (Signed)
[facility] NAME: Susan House    MR#:  [id number]  DATE OF BIRTH:  [birth year]  DATE OF ADMISSION:  07/07/2016   ADMITTING PHYSICIAN: Saundra Shelling, MD  DATE OF DISCHARGE: 07/11/2016 PRIMARY CARE PHYSICIAN: Wilhemena Durie, MD   ADMISSION DIAGNOSIS:  Dehydration [E86.0] TIA (transient ischemic attack) [G45.9] Weakness [R53.1] Transient cerebral ischemia, unspecified type [G45.9] DISCHARGE DIAGNOSIS:  Active Problems:   TIA (transient ischemic attack)   Hematochezia   Ischemic bowel disease ([place])  SECONDARY DIAGNOSIS:   Past Medical History:  Diagnosis Date  . Allergy   . Arthritis   . Cellulitis and abscess of face   . Depression   . Diabetes mellitus ([place])   . Edema   . GERD (gastroesophageal reflux disease)   . Hematuria   . History of echocardiogram    2008: normal, 2011: normal LVSF  . History of nuclear stress test    a. 12/2009: lexiscan - negative  . Hyperlipidemia   . Hypertension   . IBS (irritable bowel syndrome)   . Migraine   . Morbid obesity ([place])   . Nocturia   . Stroke ([place]) 2000  . TIA (transient ischemic attack) 2010  . Urgency of micturation   . Urinary frequency   . Urinary incontinence    HOSPITAL COURSE:   Rectal bleeding. Hold aspirin and Plavix, hemoglobin is stable. No active bleeding today. Colonoscopy today show ischemic colitis. Per Dr. Allen Norris, no aspirin or Plavix..  Symptomatic anemia due to acute blood loss. Hemoglobin decreased from 11.9 to 10.1. But up to 10.6 today.   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 Hold aspirin and Plavix, continue Lipitor..  Carotid duplex showed 50-69% stenosis in the right internal carotid artery. Less than 50% stenosis in the left internal carotid artery. Echo: No cardiac source of emboli was identified, EF 60-65%.No acute CVA per MRI/MRA of brain.  ARF.  Improved with Fluid support. resume vasotec and HCTZ. Hypertension.  Hold hypertension medication due to low side blood pressure. Resume after discharge due to elevated BP.  Diabetes. Continue sliding scale and Novolog 70/30  Her PT evaluation, the patient needs home health and PT. the patient refused.  DISCHARGE CONDITIONS:  Stable, discharge to home today. CONSULTS OBTAINED:  Treatment Team:  Leotis Pain, MD Lin Landsman, MD Lucilla Lame, MD DRUG ALLERGIES:   Allergies  Allergen Reactions  . Morphine And Related Anaphylaxis  . Fentanyl Other (See Comments)    Unknown reaction.  . Reglan [Metoclopramide]     Elevated BP  . Simvastatin Other (See Comments)    Muscle pain  . Betadine [Povidone Iodine] Rash  . Iodine Rash  . Tetracyclines & Related Rash   DISCHARGE MEDICATIONS:   Allergies as of 07/11/2016      Reactions   Morphine And Related Anaphylaxis   Fentanyl Other (See Comments)   Unknown reaction.   Reglan [metoclopramide]    Elevated BP   Simvastatin Other (See Comments)   Muscle pain   Betadine [povidone Iodine] Rash   Iodine Rash   Tetracyclines & Related Rash      Medication List    STOP taking these medications   aspirin 325 MG tablet   baclofen 20 MG tablet Commonly known as:  LIORESAL   ciprofloxacin 250 MG tablet Commonly known as:  CIPRO   clopidogrel 75 MG tablet Commonly known as:  PLAVIX     TAKE these medications  ACCU-CHEK AVIVA PLUS test strip Generic drug:  glucose blood TEST BLOOD SUGAR THREE TIMES DAILY   ACCU-CHEK AVIVA PLUS w/Device Kit Check sugar three times daily DX E11.9-needs a meter   accu-chek softclix lancets Check sugar three times daily DX E11.9   ACCU-CHEK SOFTCLIX LANCETS lancets CHECK BLOOD SUGAR THREE TIMES DAILY   amLODipine 10 MG tablet Commonly known as:  NORVASC Take 1 tablet (10 mg total) by mouth daily.   atorvastatin 20 MG tablet Commonly known as:  LIPITOR Take 1 tablet (20 mg total) by mouth at bedtime.   Cranberry 1000 MG Caps Take 2,000 mg  by mouth daily.   cyclobenzaprine 10 MG tablet Commonly known as:  FLEXERIL Take 1 tablet (10 mg total) by mouth 3 (three) times daily as needed for muscle spasms.   enalapril 20 MG tablet Commonly known as:  VASOTEC Take 1 tablet (20 mg total) by mouth 2 (two) times daily.   gabapentin 100 MG capsule Commonly known as:  NEURONTIN Take 1 capsule (100 mg total) by mouth 3 (three) times daily.   hydrochlorothiazide 25 MG tablet Commonly known as:  HYDRODIURIL Take 1 tablet (25 mg total) by mouth daily.   HYDROcodone-acetaminophen 10-325 MG tablet Commonly known as:  NORCO Take 1-2 tablets by mouth every 4 (four) hours as needed.   insulin aspart protamine- aspart (70-30) 100 UNIT/ML injection Commonly known as:  NOVOLOG MIX 70/30 Inject 25-30 Units into the skin 2 (two) times daily.   lidocaine 5 % Commonly known as:  LIDODERM Place 1 patch onto the skin daily. Remove after 12 hours   magnesium oxide 400 (241.3 Mg) MG tablet Commonly known as:  MAG-OX Take 1 tablet (400 mg total) by mouth daily.   metFORMIN 1000 MG tablet Commonly known as:  GLUCOPHAGE Take 1 tablet (1,000 mg total) by mouth 2 (two) times daily.   mometasone 0.1 % cream Commonly known as:  ELOCON Apply once daily for 1 week at a time.   nitroGLYCERIN 0.4 MG SL tablet Commonly known as:  NITROSTAT Place 1 tablet (0.4 mg total) under the tongue every 5 (five) minutes as needed for chest pain.   oxybutynin 5 MG tablet Commonly known as:  DITROPAN Take 1 tablet (5 mg total) by mouth 3 (three) times daily.   pantoprazole 40 MG tablet Commonly known as:  PROTONIX Take 1 tablet (40 mg total) by mouth daily.        DISCHARGE INSTRUCTIONS:  See AVS.  If you experience worsening of your admission symptoms, develop shortness of breath, life threatening emergency, suicidal or homicidal thoughts you must seek medical attention immediately by calling 911 or calling your MD immediately  if symptoms less  severe.  You Must read complete instructions/literature along with all the possible adverse reactions/side effects for all the Medicines you take and that have been prescribed to you. Take any new Medicines after you have completely understood and accpet all the possible adverse reactions/side effects.   Please note  You were cared for by a hospitalist during your hospital stay. If you have any questions about your discharge medications or the care you received while you were in the hospital after you are discharged, you can call the unit and asked to speak with the hospitalist on call if the hospitalist that took care of you is not available. Once you are discharged, your primary care physician will handle any further medical issues. Please note that NO REFILLS for any discharge medications will be authorized  once you are discharged, as it is imperative that you return to your primary care physician (or establish a relationship with a primary care physician if you do not have one) for your aftercare needs so that they can reassess your need for medications and monitor your lab values.    On the day of Discharge:  VITAL SIGNS:  Blood pressure (!) 140/59, pulse 88, temperature 98.3 F (36.8 C), temperature source Oral, resp. rate 18, height _0  (1.549 m), weight 270 lb 7 oz (122.7 kg), SpO2 96 %. PHYSICAL EXAMINATION:  GENERAL:  60 y.o.-year-old patient lying in the bed with no acute distress. Obese. EYES: Pupils equal, round, reactive to light and accommodation. No scleral icterus. Extraocular muscles intact.  HEENT: Head atraumatic, normocephalic. Oropharynx and nasopharynx clear.  NECK:  Supple, no jugular venous distention. No thyroid enlargement, no tenderness.  LUNGS: Normal breath sounds bilaterally, no wheezing, rales,rhonchi or crepitation. No use of accessory muscles of respiration.  CARDIOVASCULAR: S1, S2 normal. No murmurs, rubs, or gallops.  ABDOMEN: Soft, non-tender,  non-distended. Bowel sounds present. No organomegaly or mass.  EXTREMITIES: No pedal edema, cyanosis, or clubbing.  NEUROLOGIC: Cranial nerves II through XII are intact. Muscle strength 5/5 in all extremities. Sensation intact. Gait not checked.  PSYCHIATRIC: The patient is alert and oriented x 3.  SKIN: No obvious rash, lesion, or ulcer.  DATA REVIEW:   CBC  Recent Labs Lab 07/11/16 0513  WBC 6.4  HGB 10.6*  HCT 31.3*  PLT 217    Chemistries   Recent Labs Lab 07/10/16 0336  NA 140  K 3.6  CL 108  CO2 25  GLUCOSE 183*  BUN 20  CREATININE 0.74  CALCIUM 8.7*     Microbiology Results  Results for orders placed or performed in visit on 06/29/16  Urine Culture     Status: Abnormal   Collection Time: 06/29/16 12:00 AM  Result Value Ref Range Status   Urine Culture, Routine Final report (A)  Final   Urine Culture result 1 Proteus mirabilis (A)  Final    Comment: 50,000-100,000 colony forming units per mL Cefazolin <=4 ug/mL Cefazolin with an MIC <=16 predicts susceptibility to the oral agents cefaclor, cefdinir, cefpodoxime, cefprozil, cefuroxime, cephalexin, and loracarbef when used for therapy of uncomplicated urinary tract infections due to E. coli, Klebsiella pneumoniae, and Proteus mirabilis.    RESULT 2 Citrobacter freundii (A)  Final    Comment: Greater than 100,000 colony forming units per mL   ANTIMICROBIAL SUSCEPTIBILITY Comment  Final    Comment:       ** S = Susceptible; I = Intermediate; R = Resistant **                    P = Positive; N = Negative             MICS are expressed in micrograms per mL    Antibiotic                 RSLT#1    RSLT#2    RSLT#3    RSLT#4 Amoxicillin/Clavulanic Acid    S         R Ampicillin                     S Cefazolin                                R  Cefepime                       S         S Ceftriaxone                    S         S Cefuroxime                     S         R Cephalothin                    S          R Ciprofloxacin                  S         S Ertapenem                      S         S Gentamicin                     S         S Imipenem                                 S Levofloxacin                   S         S Nitrofurantoin                 R         S Piperacillin                   S         S Tetracycline                   R         S Tobramycin                     S         S Trimethoprim/Sulfa             S         S     RADIOLOGY:  No results found.   Management plans discussed with the patient, Her husband and they are in agreement.  CODE STATUS:     Code Status Orders        Start     Ordered   07/08/16 0438  Full code  Continuous     07/08/16 0437    Code Status History    Date Active Date Inactive Code Status Order ID Comments User Context   12/26/2015  6:47 PM 12/28/2015  8:27 PM Full Code [id number]  Idelle Crouch, MD Inpatient   07/22/2015  7:35 AM 07/23/2015  3:05 PM Full Code [id number]  Saundra Shelling, MD Inpatient    Advance Directive Documentation   Flowsheet Row Most Recent Value  Type of Advance Directive  Healthcare Power of Attorney, Living will  Pre-existing out of facility DNR order (yellow form or pink MOST form)  No data  "MOST" Form in Place?  No data      TOTAL TIME TAKING CARE OF THIS PATIENT: 36 minutes.  Demetrios Loll M.D on 07/11/2016 at 12:30 PM  Between 7am to 6pm - Pager - [phone]  After 6pm go to [email or web address] - password [email or web address] [facility]  [employer]  Hospitalists  Office  [phone]  CC: Primary care physician; Wilhemena Durie, MD   Note: This dictation was prepared with Dragon dictation along with smaller phrase technology. Any transcriptional errors that result from this process are unintentional.

## 2016-07-11 NOTE — Care Management (Signed)
Admitted to this facility with the diagnosis of TIA. Lives with husband, Susan House [phone]). Seen Dr, Rosanna Randy about a week ago. [place] in the past. 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 Last fall was 1-2 years ago. Fair appetite. Lost about 15 lbs. Cane, rolling walker, bedside commode, and electric wheelchair in the home. Takes care of all basic activities of daily living herself. Husband will transport. Physical therapy evaluation completed. Recommends home with home health and therapy. Discussed agencies with Susan House at the bedside. Declines services at this time. Discharge to home today per Dr. Bridgett Larsson. Shelbie Ammons RN MSN CCM Care Management

## 2016-07-11 NOTE — Discharge Instructions (Signed)
Heart healthy and low residulal diet. Activity as tolerated.

## 2016-07-11 NOTE — Anesthesia Postprocedure Evaluation (Signed)
Anesthesia Post Note  Patient: Susan House  Procedure(s) Performed: Procedure(s) (LRB): COLONOSCOPY WITH PROPOFOL (N/A)  Patient location during evaluation: PACU Anesthesia Type: General Level of consciousness: awake and alert Pain management: pain level controlled Vital Signs Assessment: post-procedure vital signs reviewed and stable Respiratory status: spontaneous breathing and respiratory function stable Cardiovascular status: stable Anesthetic complications: no     Last Vitals:  Vitals:   07/11/16 0440 07/11/16 0824  BP: (!) 175/70 110/83  Pulse: (!) 109 87  Resp: 20 13  Temp: 36.6 C 36.3 C    Last Pain:  Vitals:   07/11/16 0440  TempSrc: Oral  PainSc:                  KEPHART,WILLIAM K

## 2016-07-11 NOTE — Op Note (Signed)
[facility] Gastroenterology Patient Name: Susan House Procedure Date: 07/11/2016 8:04 AM MRN: [id number] Account #: [id number] Date of Birth: 1955-09-03 Admit Type: Inpatient Age: 60 Room: [facility] ENDO ROOM 2 Gender: Female Note Status: Finalized Procedure:            Colonoscopy Indications:          Hematochezia Providers:            Lucilla Lame MD, MD Referring MD:         Janine Ores. Rosanna Randy, MD (Referring MD) Medicines:            Propofol per Anesthesia Complications:        No immediate complications. Procedure:            Pre-Anesthesia Assessment:                       - Prior to the procedure, a History and Physical was                        performed, and patient medications and allergies were                        reviewed. The patient's tolerance of previous                        anesthesia was also reviewed. The risks and benefits of                        the procedure and the sedation options and risks were                        discussed with the patient. All questions were                        answered, and informed consent was obtained. Prior                        Anticoagulants: The patient has taken no previous                        anticoagulant or antiplatelet agents. ASA Grade                        Assessment: II - A patient with mild systemic disease.                        After reviewing the risks and benefits, the patient was                        deemed in satisfactory condition to undergo the                        procedure.                       After obtaining informed consent, the colonoscope was                        passed under direct vision. Throughout the procedure,  the patient's blood pressure, pulse, and oxygen                        saturations were monitored continuously. The                        Colonoscope was introduced through the anus and                        advanced to the the  descending colon. The colonoscopy                        was performed without difficulty. The patient tolerated                        the procedure well. The quality of the bowel                        preparation was good. Findings:      The perianal and digital rectal examinations were normal.      Segmental severe inflammation characterized by erythema, friability and       linear erosions was found in the sigmoid colon. Biopsies were taken with       a cold forceps for histology. Impression:           - Segmental severe inflammation was found in the                        sigmoid colon secondary to ischemic colitis. Biopsied. Recommendation:       - Return patient to hospital ward for ongoing care. Procedure Code(s):    --- Professional ---                       [phone], 80, Colonoscopy, flexible; with biopsy, single                        or multiple Diagnosis Code(s):    --- Professional ---                       K92.1, Melena (includes Hematochezia)                       K55.9, Vascular disorder of intestine, unspecified CPT copyright 2016 American Medical Association. All rights reserved. The codes documented in this report are preliminary and upon coder review may  be revised to meet current compliance requirements. Lucilla Lame MD, MD 07/11/2016 8:17:46 AM This report has been signed electronically. Number of Addenda: 0 Note Initiated On: 07/11/2016 8:04 AM Total Procedure Duration: 0 hours 3 minutes 39 seconds       [facility]

## 2016-07-12 ENCOUNTER — Encounter: Payer: Self-pay | Admitting: Gastroenterology

## 2016-07-12 ENCOUNTER — Telehealth: Payer: Self-pay

## 2016-07-12 LAB — SURGICAL PATHOLOGY

## 2016-07-13 ENCOUNTER — Telehealth: Payer: Self-pay | Admitting: Neurology

## 2016-07-13 NOTE — Telephone Encounter (Signed)
Called pt back. She was admitted for a possible TIA on 07/07/16 but was cleared. She started to have bloody stools and they found the GI bleed. Stayed from 12/22-12/26. Was told from biopsy that she may have ulcerative colitis. They took her off baclofen, plavix, ASA. Did not tell her how long they want her off of the medication or if they want her to resume.  They did not place her on any antibiotics. She is not sure of next steps to take. They did not send her home on any medication.She was told to stop cipro that she was taking 3 days prior to being admitted to hospital.   Advised Dr Dohmeier seeing pt today and I will send her the message to determine if she would like her to do anything different. Pt verbalized understanding.

## 2016-07-13 NOTE — Telephone Encounter (Signed)
Patient called back. Can call back on home phone number. Thank you-aa

## 2016-07-13 NOTE — Telephone Encounter (Signed)
Pt called to advise she was admitted to hospital for GI bleed on 12/22  - 12/26. Colonoscopy showed preliminary ulcerative colitis. She advised Plavix, Asprin and baclofen was stopped. Please call

## 2016-07-13 NOTE — Telephone Encounter (Signed)
Called and spoke to patient. Relayed Dr Dohmeier's message below. She verbalized understanding. She stated "I was not sure what to do when I got discharged. All I was told was to make appt with PCP and GI doctor". She verified she already has appt with GI doctor. Advised her to call with any further questions/concerns.

## 2016-07-13 NOTE — Telephone Encounter (Signed)
Transition Care Management Follow-up Telephone Call    Date discharged? 07/11/2016  How have you been since you were released from the hospital? Recovering, but still weak.  Any patient concerns? Still having diarrhea and experiencing hyperactive bowel sounds. Pt states this is an ongoing issue for her. Pt denied fever, nausea, abdominal pain or vomiting.   Items Reviewed:  Medications reviewed: Yes  Allergies reviewed: Yes  Dietary changes reviewed: Yes  Referrals reviewed: N/A   Functional Questionnaire:  Independent - I Dependent - D    Activities of Daily Living (ADLs):    Personal hygiene - I Dressing - Need assistance sometimes Eating - I Maintaining continence - D Transferring - D  Independent Activities of Daily Living (iADLs): Basic communication skills - I Transportation - I Meal preparation  - I Shopping - I Housework - I Managing medications - I  Managing personal finances - I   Confirmed importance and date/time of follow-up visits scheduled YES  Provider Appointment booked with PCP 07/20/16 @ 3 PM with Dr. Rosanna Randy.  Confirmed with patient if condition begins to worsen call PCP or go to the ER.  Patient was given the office number and encouraged to call back with question or concerns: YES

## 2016-07-13 NOTE — Telephone Encounter (Signed)
No change in meds, stay off  ASA, etc until cleared by GI- make GI follow up appointment.

## 2016-07-17 LAB — GLUCOSE, CAPILLARY: Glucose-Capillary: 190 mg/dL — ABNORMAL HIGH (ref 65–99)

## 2016-07-18 ENCOUNTER — Ambulatory Visit (INDEPENDENT_AMBULATORY_CARE_PROVIDER_SITE_OTHER): Payer: PPO | Admitting: Family Medicine

## 2016-07-18 VITALS — BP 128/74 | HR 88 | Temp 97.6°F | Resp 18

## 2016-07-18 DIAGNOSIS — N183 Chronic kidney disease, stage 3 unspecified: Secondary | ICD-10-CM

## 2016-07-18 DIAGNOSIS — I10 Essential (primary) hypertension: Secondary | ICD-10-CM

## 2016-07-18 DIAGNOSIS — R079 Chest pain, unspecified: Secondary | ICD-10-CM

## 2016-07-18 DIAGNOSIS — E1122 Type 2 diabetes mellitus with diabetic chronic kidney disease: Secondary | ICD-10-CM | POA: Diagnosis not present

## 2016-07-18 DIAGNOSIS — M5441 Lumbago with sciatica, right side: Secondary | ICD-10-CM

## 2016-07-18 DIAGNOSIS — M5442 Lumbago with sciatica, left side: Secondary | ICD-10-CM

## 2016-07-18 DIAGNOSIS — I493 Ventricular premature depolarization: Secondary | ICD-10-CM

## 2016-07-18 DIAGNOSIS — I69959 Hemiplegia and hemiparesis following unspecified cerebrovascular disease affecting unspecified side: Secondary | ICD-10-CM | POA: Diagnosis not present

## 2016-07-18 DIAGNOSIS — G8929 Other chronic pain: Secondary | ICD-10-CM

## 2016-07-18 DIAGNOSIS — Z09 Encounter for follow-up examination after completed treatment for conditions other than malignant neoplasm: Secondary | ICD-10-CM

## 2016-07-18 DIAGNOSIS — N390 Urinary tract infection, site not specified: Secondary | ICD-10-CM

## 2016-07-18 DIAGNOSIS — I998 Other disorder of circulatory system: Secondary | ICD-10-CM

## 2016-07-18 MED ORDER — CIPROFLOXACIN HCL 250 MG PO TABS: 250.0000 mg | ORAL_TABLET | Freq: Two times a day (BID) | ORAL | 0 refills | Status: DC
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
## 2016-07-19 ENCOUNTER — Telehealth: Payer: Self-pay

## 2016-07-19 LAB — CBC WITH DIFFERENTIAL/PLATELET
BASOS ABS: 0 10*3/uL (ref 0.0–0.2)
Basos: 1 %
EOS (ABSOLUTE): 0.7 10*3/uL — ABNORMAL HIGH (ref 0.0–0.4)
EOS: 11 %
HEMATOCRIT: 34.2 % (ref 34.0–46.6)
HEMOGLOBIN: 10.8 g/dL — AB (ref 11.1–15.9)
IMMATURE GRANS (ABS): 0 10*3/uL (ref 0.0–0.1)
Immature Granulocytes: 1 %
LYMPHS: 28 %
Lymphocytes Absolute: 2 10*3/uL (ref 0.7–3.1)
MCH: 26.5 pg — ABNORMAL LOW (ref 26.6–33.0)
MCHC: 31.6 g/dL (ref 31.5–35.7)
MCV: 84 fL (ref 79–97)
MONOCYTES: 10 %
Monocytes Absolute: 0.7 10*3/uL (ref 0.1–0.9)
Neutrophils Absolute: 3.6 10*3/uL (ref 1.4–7.0)
Neutrophils: 49 %
Platelets: 330 10*3/uL (ref 150–379)
RBC: 4.08 x10E6/uL (ref 3.77–5.28)
RDW: 16.8 % — ABNORMAL HIGH (ref 12.3–15.4)
WBC: 7 10*3/uL (ref 3.4–10.8)

## 2016-07-19 LAB — COMPREHENSIVE METABOLIC PANEL
ALBUMIN: 4.2 g/dL (ref 3.6–4.8)
ALT: 11 IU/L (ref 0–32)
AST: 19 IU/L (ref 0–40)
Albumin/Globulin Ratio: 1.8 (ref 1.2–2.2)
Alkaline Phosphatase: 76 IU/L (ref 39–117)
BUN / CREAT RATIO: 19 (ref 12–28)
BUN: 20 mg/dL (ref 8–27)
Bilirubin Total: 0.3 mg/dL (ref 0.0–1.2)
CALCIUM: 8.9 mg/dL (ref 8.7–10.3)
CO2: 24 mmol/L (ref 18–29)
CREATININE: 1.06 mg/dL — AB (ref 0.57–1.00)
Chloride: 102 mmol/L (ref 96–106)
GFR, EST AFRICAN AMERICAN: 66 mL/min/{1.73_m2} (ref >59)
GFR, EST NON AFRICAN AMERICAN: 57 mL/min/{1.73_m2} — AB (ref >59)
GLOBULIN, TOTAL: 2.3 g/dL (ref 1.5–4.5)
Glucose: 100 mg/dL — ABNORMAL HIGH (ref 65–99)
Potassium: 3.8 mmol/L (ref 3.5–5.2)
SODIUM: 144 mmol/L (ref 134–144)
TOTAL PROTEIN: 6.5 g/dL (ref 6.0–8.5)

## 2016-07-19 LAB — TSH: TSH: 3.37 u[IU]/mL (ref 0.450–4.500)

## 2016-07-19 NOTE — Telephone Encounter (Signed)
-----   Message from Jerrol Banana., MD sent at 07/19/2016 10:12 AM EST ----- Labs okay. Stay hydrated.

## 2016-07-19 NOTE — Telephone Encounter (Signed)
Patient advised of lab results.   Patient states when she was in the hospital they discontinued Plavix, aspirin, and baclofen. She wants to know if she needs to restart medication. Please advise.

## 2016-07-19 NOTE — Telephone Encounter (Signed)
I would restart the aspirin only of these.

## 2016-07-19 NOTE — Telephone Encounter (Signed)
Patient advised.

## 2016-07-20 ENCOUNTER — Inpatient Hospital Stay: Payer: Self-pay | Admitting: Family Medicine

## 2016-07-21 ENCOUNTER — Telehealth: Payer: Self-pay

## 2016-07-21 NOTE — Telephone Encounter (Signed)
LVM for pt to return my call.

## 2016-07-21 NOTE — Telephone Encounter (Signed)
-----   Message from Lucilla Lame, MD sent at 07/19/2016  2:10 PM EST ----- Let the patient know that the biopsies were consistent with decreased blood flow to the colon. This will usually resolve in time.

## 2016-07-21 NOTE — Telephone Encounter (Signed)
Patient left a voice message returning your call regarding results. °

## 2016-07-24 NOTE — Telephone Encounter (Signed)
Pt notified of colonoscopy results.  

## 2016-07-25 ENCOUNTER — Ambulatory Visit (INDEPENDENT_AMBULATORY_CARE_PROVIDER_SITE_OTHER): Payer: PPO | Admitting: Family Medicine

## 2016-07-25 ENCOUNTER — Ambulatory Visit
Admission: RE | Admit: 2016-07-25 | Discharge: 2016-07-25 | Disposition: A | Discharge status: Home / Self Care | Payer: PPO | Source: Ambulatory Visit | Attending: Family Medicine | Admitting: Family Medicine

## 2016-07-25 VITALS — BP 138/66 | HR 120 | Temp 99.3°F | Resp 18

## 2016-07-25 DIAGNOSIS — E860 Dehydration: Secondary | ICD-10-CM | POA: Diagnosis not present

## 2016-07-25 DIAGNOSIS — I69959 Hemiplegia and hemiparesis following unspecified cerebrovascular disease affecting unspecified side: Secondary | ICD-10-CM | POA: Diagnosis not present

## 2016-07-25 DIAGNOSIS — I708 Atherosclerosis of other arteries: Secondary | ICD-10-CM | POA: Diagnosis not present

## 2016-07-25 DIAGNOSIS — K529 Noninfective gastroenteritis and colitis, unspecified: Secondary | ICD-10-CM

## 2016-07-25 DIAGNOSIS — R110 Nausea: Secondary | ICD-10-CM | POA: Diagnosis not present

## 2016-07-25 DIAGNOSIS — R197 Diarrhea, unspecified: Secondary | ICD-10-CM | POA: Diagnosis not present

## 2016-07-25 DIAGNOSIS — E1122 Type 2 diabetes mellitus with diabetic chronic kidney disease: Secondary | ICD-10-CM | POA: Diagnosis not present

## 2016-07-25 DIAGNOSIS — N183 Chronic kidney disease, stage 3 unspecified: Secondary | ICD-10-CM

## 2016-07-25 DIAGNOSIS — I700 Atherosclerosis of aorta: Secondary | ICD-10-CM | POA: Insufficient documentation

## 2016-07-25 MED ORDER — PROMETHAZINE HCL 25 MG/ML IJ SOLN
50.0000 mg | Freq: Once | INTRAMUSCULAR | Status: AC
  Administered 2016-07-25: 50 mg via INTRAMUSCULAR

## 2016-07-25 MED ORDER — PROMETHAZINE HCL 25 MG PO TABS: 25.0000 mg | ORAL_TABLET | Freq: Three times a day (TID) | ORAL | 1 refills | Status: DC | PRN

## 2016-07-25 MED ORDER — DIPHENOXYLATE-ATROPINE 2.5-0.025 MG PO TABS: ORAL_TABLET | ORAL | 1 refills | Status: DC
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
## 2016-07-26 ENCOUNTER — Ambulatory Visit (INDEPENDENT_AMBULATORY_CARE_PROVIDER_SITE_OTHER): Payer: PPO | Admitting: Family Medicine

## 2016-07-26 ENCOUNTER — Telehealth: Payer: Self-pay

## 2016-07-26 VITALS — BP 160/66 | HR 74 | Temp 99.0°F | Resp 18

## 2016-07-26 DIAGNOSIS — M545 Low back pain: Secondary | ICD-10-CM

## 2016-07-26 DIAGNOSIS — N183 Chronic kidney disease, stage 3 unspecified: Secondary | ICD-10-CM

## 2016-07-26 DIAGNOSIS — G8929 Other chronic pain: Secondary | ICD-10-CM | POA: Diagnosis not present

## 2016-07-26 DIAGNOSIS — E1122 Type 2 diabetes mellitus with diabetic chronic kidney disease: Secondary | ICD-10-CM

## 2016-07-26 DIAGNOSIS — M5431 Sciatica, right side: Secondary | ICD-10-CM

## 2016-07-26 DIAGNOSIS — B351 Tinea unguium: Secondary | ICD-10-CM | POA: Diagnosis not present

## 2016-07-26 DIAGNOSIS — I10 Essential (primary) hypertension: Secondary | ICD-10-CM

## 2016-07-26 DIAGNOSIS — L851 Acquired keratosis [keratoderma] palmaris et plantaris: Secondary | ICD-10-CM | POA: Diagnosis not present

## 2016-07-26 DIAGNOSIS — E119 Type 2 diabetes mellitus without complications: Secondary | ICD-10-CM | POA: Diagnosis not present

## 2016-07-26 LAB — COMPREHENSIVE METABOLIC PANEL WITH GFR
ALT: 16 IU/L (ref 0–32)
AST: 23 IU/L (ref 0–40)
Albumin/Globulin Ratio: 1.6 (ref 1.2–2.2)
Albumin: 4.1 g/dL (ref 3.6–4.8)
Alkaline Phosphatase: 79 IU/L (ref 39–117)
BUN/Creatinine Ratio: 19 (ref 12–28)
BUN: 22 mg/dL (ref 8–27)
Bilirubin Total: 0.4 mg/dL (ref 0.0–1.2)
CO2: 19 mmol/L (ref 18–29)
Calcium: 9 mg/dL (ref 8.7–10.3)
Chloride: 96 mmol/L (ref 96–106)
Creatinine, Ser: 1.15 mg/dL — ABNORMAL HIGH (ref 0.57–1.00)
GFR calc Af Amer: 60 mL/min/1.73 (ref >59)
GFR calc non Af Amer: 52 mL/min/1.73 — ABNORMAL LOW (ref >59)
Globulin, Total: 2.6 g/dL (ref 1.5–4.5)
Glucose: 269 mg/dL — ABNORMAL HIGH (ref 65–99)
Potassium: 4.1 mmol/L (ref 3.5–5.2)
Sodium: 137 mmol/L (ref 134–144)
Total Protein: 6.7 g/dL (ref 6.0–8.5)

## 2016-07-26 LAB — CBC WITH DIFFERENTIAL/PLATELET
BASOS: 1 %
Basophils Absolute: 0 10*3/uL (ref 0.0–0.2)
EOS (ABSOLUTE): 0.1 10*3/uL (ref 0.0–0.4)
Eos: 1 %
Hematocrit: 36.1 % (ref 34.0–46.6)
Hemoglobin: 11.5 g/dL (ref 11.1–15.9)
IMMATURE GRANULOCYTES: 0 %
Immature Grans (Abs): 0 10*3/uL (ref 0.0–0.1)
Lymphocytes Absolute: 0.7 10*3/uL (ref 0.7–3.1)
Lymphs: 10 %
MCH: 26.9 pg (ref 26.6–33.0)
MCHC: 31.9 g/dL (ref 31.5–35.7)
MCV: 85 fL (ref 79–97)
MONOS ABS: 0.9 10*3/uL (ref 0.1–0.9)
Monocytes: 13 %
NEUTROS PCT: 75 %
Neutrophils Absolute: 4.9 10*3/uL (ref 1.4–7.0)
PLATELETS: 253 10*3/uL (ref 150–379)
RBC: 4.27 x10E6/uL (ref 3.77–5.28)
RDW: 16.7 % — ABNORMAL HIGH (ref 12.3–15.4)
WBC: 6.5 10*3/uL (ref 3.4–10.8)

## 2016-07-26 MED ORDER — METHYLPREDNISOLONE ACETATE 80 MG/ML IJ SUSP
80.0000 mg | Freq: Once | INTRAMUSCULAR | Status: AC
  Administered 2016-07-26: 80 mg via INTRAMUSCULAR

## 2016-07-26 MED ORDER — OXYCODONE HCL 5 MG PO TABS: 5.0000 mg | ORAL_TABLET | ORAL | 0 refills | Status: DC | PRN

## 2016-07-26 NOTE — Telephone Encounter (Signed)
LMTCB

## 2016-07-26 NOTE — Telephone Encounter (Signed)
-----   Message from Jerrol Banana., MD sent at 07/26/2016  6:51 AM EST ----- X-ray of abdomen okay. Continue to push fluids only for now. Advance diet as tolerated.

## 2016-07-26 NOTE — Telephone Encounter (Signed)
Patient coming in for a OV at 10:30. Nurse will advise patient of lab results.
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
## 2016-07-26 NOTE — Telephone Encounter (Signed)
-----   Message from Jerrol Banana., MD sent at 07/26/2016  6:54 AM EST ----- Labs okay.

## 2016-07-28 ENCOUNTER — Encounter: Payer: Self-pay | Admitting: Cardiovascular Disease

## 2016-07-28 ENCOUNTER — Ambulatory Visit (INDEPENDENT_AMBULATORY_CARE_PROVIDER_SITE_OTHER): Payer: PPO | Admitting: Cardiovascular Disease

## 2016-07-28 VITALS — BP 140/72 | HR 109 | Ht 61.0 in | Wt 257.0 lb

## 2016-07-28 DIAGNOSIS — I638 Other cerebral infarction: Secondary | ICD-10-CM

## 2016-07-28 DIAGNOSIS — E782 Mixed hyperlipidemia: Secondary | ICD-10-CM | POA: Diagnosis not present

## 2016-07-28 DIAGNOSIS — K559 Vascular disorder of intestine, unspecified: Secondary | ICD-10-CM

## 2016-07-28 DIAGNOSIS — I739 Peripheral vascular disease, unspecified: Secondary | ICD-10-CM | POA: Diagnosis not present

## 2016-07-28 DIAGNOSIS — Z794 Long term (current) use of insulin: Secondary | ICD-10-CM

## 2016-07-28 DIAGNOSIS — I10 Essential (primary) hypertension: Secondary | ICD-10-CM

## 2016-07-28 DIAGNOSIS — G451 Carotid artery syndrome (hemispheric): Secondary | ICD-10-CM

## 2016-07-28 DIAGNOSIS — R000 Tachycardia, unspecified: Secondary | ICD-10-CM

## 2016-07-28 DIAGNOSIS — I6389 Other cerebral infarction: Secondary | ICD-10-CM

## 2016-07-28 DIAGNOSIS — R002 Palpitations: Secondary | ICD-10-CM

## 2016-07-28 DIAGNOSIS — E118 Type 2 diabetes mellitus with unspecified complications: Secondary | ICD-10-CM

## 2016-07-28 MED ORDER — METOPROLOL SUCCINATE ER 50 MG PO TB24: 50.0000 mg | ORAL_TABLET | Freq: Every day | ORAL | 11 refills | Status: DC

## 2016-07-28 MED ORDER — ATORVASTATIN CALCIUM 40 MG PO TABS: 20.0000 mg | ORAL_TABLET | Freq: Every day | ORAL | 3 refills | Status: DC

## 2016-07-28 NOTE — Patient Instructions (Addendum)
Medication Instructions:   Please stop the HCTZ  Start metoprolol one a day for fast heart rate  Please increase your Lipitor to 40 mg daily (up from 20 mg)  Iron pill one a day ferrous  Labwork:  No new labs needed  Testing/Procedures:  Your physician has requested that you have a lower extremity arterial exercise duplex. During this test, exercise and ultrasound are used to evaluate arterial blood flow in the legs. Allow one hour for this exam. There are no restrictions or special instructions.   I recommend watching educational videos on topics of interest to you at:       [email or web address]  Enter code: HEARTCARE    Follow-Up: It was a pleasure seeing you in the office today. Please call us if you have new issues that need to be addressed before your next appt.  [phone]  Your physician wants you to follow-up in: 6 month.    If you need a refill on your cardiac medications before your next appointment, please call your pharmacy.     Intermittent Claudication Intermittent claudication is pain in your leg that occurs when you walk or exercise and goes away when you rest. The pain can occur in one or both legs. CAUSES Intermittent claudication is caused by the buildup of plaque within the major arteries in the body (atherosclerosis). The plaque, which makes arteries stiff and narrow, prevents enough blood from reaching your leg muscles. The pain occurs when you walk or exercise because your muscles need more blood when you are moving and exercising. RISK FACTORS Risk factors include:  A family history of atherosclerosis.  A personal history of stroke or heart disease.  Older age.  Being inactive or overweight.  Smoking cigarettes.  Having another health condition such as:  Diabetes.  High blood pressure.  High cholesterol. SIGNS AND SYMPTOMS  Your hip or leg may:   Ache.  Cramp.  Feel tight.  Feel weak.  Feel heavy. Over time, you may feel  pain in your calf, thigh, or hip. DIAGNOSIS  Your health care provider may diagnose intermittent claudication based on your symptoms and medical history. Your health care provider may also do tests to learn more about your condition. These may include:  Blood tests.  An ultrasound.  Imaging tests such as angiography, magnetic resonance angiography (MRA), and computed tomography angiography (CTA). TREATMENT You may be treated for problems such as:  High blood pressure.  High cholesterol.  Diabetes. Other treatments may include:  Lifestyle changes such as:  Starting an exercise program.  Losing weight.  Quitting smoking.  Medicines to help restore blood flow through your legs.  Blood vessel surgery (angioplasty) to restore blood flow if your intermittent claudication is caused by severe peripheral artery disease. HOME CARE INSTRUCTIONS  Manage any other health conditions you have.  Eat a diet low in saturated fats and calories to maintain a healthy weight.  Quit smoking, if you smoke.  Take medicines only as directed by your health care provider.  If your health care provider recommended an exercise program for you, follow it as directed. Your exercise program may involve:  Walking three or more times a week.  Walking until you have certain symptoms of intermittent claudication.  Resting until symptoms go away.  Gradually increasing walking time to about 50 minutes a day. SEEK MEDICAL CARE IF: Your condition is not getting better or is getting worse. SEEK IMMEDIATE MEDICAL CARE IF:   You have chest pain.  You have  difficulty breathing.  You develop arm weakness.  You have trouble speaking.  Your face begins to droop. MAKE SURE YOU:  Understand these instructions.  Will watch your condition.  Will get help if you are not doing well or get worse. This information is not intended to replace advice given to you by your health care provider. Make sure you  discuss any questions you have with your health care provider. Document Released: 05/05/2004 Document Revised: 07/24/2014 Document Reviewed: 10/09/2013 Elsevier Interactive Patient Education  2017 [employer].

## 2016-07-28 NOTE — Progress Notes (Signed)
Cardiology Office Note  Date:  07/28/2016   ID:  Niyanna, Asch Nov 24, 1955, MRN [id number]  PCP:  Wilhemena Durie, MD   Chief Complaint  Patient presents with  . other    Follow up from [facility] from palpitations & PVS's. Meds reviewed by the pt. verbally.     HPI:  61 year old female with a history of stroke in 2000 followed by posterior circulatory TIA in June of 2010, on Plavix and aspirin, chronic dizziness in the past secondary to dehydration, restless leg syndrome, type 2 diabetes, previous episodes of chest pain who presents for routine follow-up of her stroke, chest pain symptoms. She has residual left side weakness arm and leg from her stroke carotid ultrasound 2008 showing mild to moderate carotid arterial disease.  In follow-up today, she reports Being In hospital 07/07/16, possible TIA Could not hear in both the ears ,  felt her speech was slurred. Creatinine 1.6, BUN 45 on arrival, takes HCTZ CT scan of the head did not show any acute intracranial abnormality except for chronic white matter changes  Carotid duplex showed 50-69% stenosis in the right internal carotid artery. Less than 50% stenosis in the left internal carotid artery.  Echo: No cardiac source of emboli was identified, EF 60-65%. She had GI bleed, seen by GI Hemoglobin decreased from 11.9 to 10.1.  up to 10.6 .  Colonoscopy today show ischemic colitis. Stopped plavix Back on asa 81  MRA HEAD Flow noted within the distal vertebral arteries and basilar artery. significant atherosclerotic changes of the posterior cerebral arteries and superior cerebral arteries greater on the right.  As an outpatient, Back on HCTZ Total chol 120, LDL 69 She feels weak, not back to her baseline Denies any focal neurologic deficits denies any significant chest pain on exertion  She has noticed tachycardia both at home and in Dr. offices, heart rate typically greater than 100 Prior history of chronic diarrhea,  comes and goes  dehydration  in early January 2017 She reports recent weight loss  EKG on today's visit shows normal sinus rhythm with rate 108 bpm, PVC No change from prior EKGs  Other past medical history reviewed cardiac workup in the past for chest pain including numerous echocardiograms dating back to 2008, cardiac catheterization. Results of the latter is not available at this time chest pain early December 2015. She was seen in the emergency room to summer second with normal workup at that time, cardiac enzymes negative, chest x-ray reviewed showing no abnormalities. Normal EKG She returned to the emergency room with hematuria 07/02/2014 from urinary tract infection. These are becoming more common per the patient  Last echocardiogram July 2015 showing normal ejection fraction She reports having some arrhythmia in early January 2016 in the setting of infection of her toe. Since her toe infection has resolved, she's had no further palpitations. She was treated with Augmentin. No EKG documenting any arrhythmia.  Prior carotid ultrasound 2008 showing mild to moderate carotid arterial disease. Stress test July 2011 showing no ischemia. At that time was having chest pain and had rule out Continues to have chronic dizziness, typically when she stands Mouth is always dry, she attributes this to the oxybutynin  Lab work Junior 2015 showing total cholesterol 132, LDL 67 EKG November 2014 showing rare PVC   PMH:   has a past medical history of Allergy; Arthritis; Cellulitis and abscess of face; Depression; Diabetes mellitus ([place]); Edema; GERD (gastroesophageal reflux disease); Hematuria; History of echocardiogram; History of  nuclear stress test; Hyperlipidemia; Hypertension; IBS (irritable bowel syndrome); Migraine; Morbid obesity ([place]); Nocturia; Stroke ([place]) (2000); TIA (transient ischemic attack) (2010); Urgency of micturation; Urinary frequency; and Urinary incontinence.  PSH:    Past  Surgical History:  Procedure Laterality Date  . ABDOMINAL HYSTERECTOMY    . ABDOMINOPLASTY    . APPENDECTOMY    . BACK SURGERY     extensive spinal fusion  . carpel tunnel surgery    . CATARACT EXTRACTION     x 2  . CESAREAN SECTION    . CHOLECYSTECTOMY    . COLONOSCOPY  2010  . COLONOSCOPY WITH PROPOFOL N/A 07/11/2016   Procedure: COLONOSCOPY WITH PROPOFOL;  Surgeon: Lucilla Lame, MD;  Location: [facility] ENDOSCOPY;  Service: Endoscopy;  Laterality: N/A;  . EYE SURGERY    . HERNIA REPAIR    . SHOULDER SURGERY    . TOE SURGERY    . UPPER GI ENDOSCOPY    . WRIST FRACTURE SURGERY Left     Current Outpatient Prescriptions  Medication Sig Dispense Refill  . ACCU-CHEK AVIVA PLUS test strip TEST BLOOD SUGAR THREE TIMES DAILY 300 each 3  . ACCU-CHEK SOFTCLIX LANCETS lancets CHECK BLOOD SUGAR THREE TIMES DAILY 300 each 3  . amLODipine (NORVASC) 10 MG tablet Take 1 tablet (10 mg total) by mouth daily. 90 tablet 3  . atorvastatin (LIPITOR) 40 MG tablet Take 0.5 tablets (20 mg total) by mouth at bedtime. 90 tablet 3  . Blood Glucose Monitoring Suppl (ACCU-CHEK AVIVA PLUS) w/Device KIT Check sugar three times daily DX E11.9-needs a meter 1 kit 0  . Cranberry 1000 MG CAPS Take 2,000 mg by mouth daily.     . cyclobenzaprine (FLEXERIL) 10 MG tablet Take 1 tablet (10 mg total) by mouth 3 (three) times daily as needed for muscle spasms. 30 tablet 0  . diphenoxylate-atropine (LOMOTIL) 2.5-0.025 MG tablet 2 tablets initially and then 1 tablet every 6 hours as needed for diarrhea 60 tablet 1  . enalapril (VASOTEC) 20 MG tablet Take 1 tablet (20 mg total) by mouth 2 (two) times daily. 180 tablet 3  . gabapentin (NEURONTIN) 100 MG capsule Take 1 capsule (100 mg total) by mouth 3 (three) times daily. 270 capsule 3  . HYDROcodone-acetaminophen (NORCO) 10-325 MG tablet Take 1-2 tablets by mouth every 4 (four) hours as needed. 180 tablet 0  . insulin aspart protamine- aspart (NOVOLOG MIX 70/30) (70-30) 100  UNIT/ML injection Inject 25-30 Units into the skin 2 (two) times daily.     [employer] Devices (ACCU-CHEK SOFTCLIX) lancets Check sugar three times daily DX E11.9 100 each 3  . lidocaine (LIDODERM) 5 % Place 1 patch onto the skin daily. Remove after 12 hours 30 patch 12  . magnesium oxide (MAG-OX) 400 (241.3 Mg) MG tablet Take 1 tablet (400 mg total) by mouth daily. 90 tablet 3  . metFORMIN (GLUCOPHAGE) 1000 MG tablet Take 1 tablet (1,000 mg total) by mouth 2 (two) times daily. 180 tablet 3  . mometasone (ELOCON) 0.1 % cream Apply once daily for 1 week at a time. 50 g 2  . nitroGLYCERIN (NITROSTAT) 0.4 MG SL tablet Place 1 tablet (0.4 mg total) under the tongue every 5 (five) minutes as needed for chest pain. 25 tablet 0  . oxybutynin (DITROPAN) 5 MG tablet Take 1 tablet (5 mg total) by mouth 3 (three) times daily. 270 tablet 3  . oxyCODONE (OXY IR/ROXICODONE) 5 MG immediate release tablet Take 1 tablet (5 mg total) by mouth  every 4 (four) hours as needed for severe pain. 100 tablet 0  . pantoprazole (PROTONIX) 40 MG tablet Take 1 tablet (40 mg total) by mouth daily. 90 tablet 3  . promethazine (PHENERGAN) 25 MG tablet Take 1 tablet (25 mg total) by mouth every 8 (eight) hours as needed for nausea or vomiting. 55 tablet 1  . metoprolol succinate (TOPROL-XL) 50 MG 24 hr tablet Take 1 tablet (50 mg total) by mouth daily. Take with or immediately following a meal. 30 tablet 11   No current facility-administered medications for this visit.      Allergies:   Morphine and related; Fentanyl; Reglan [metoclopramide]; Simvastatin; Betadine [povidone iodine]; Iodine; and Tetracyclines & related   Social History:  The patient  reports that she has never smoked. She has never used smokeless tobacco. She reports that she does not drink alcohol or use drugs.   Family History:   family history includes Arrhythmia in her mother; Coronary artery disease in her father; Depression in her sister and sister; Diabetes  in her father, sister, and sister; Heart attack (age of onset: 55) in her father; Heart disease in her father and mother; Hyperlipidemia in her father, mother, sister, and sister; Hypertension in her father, mother, sister, and sister; Kidney disease in her paternal grandmother; Rheum arthritis in her mother; Stroke in her father.    Review of Systems: Review of Systems  Constitutional: Positive for malaise/fatigue.  Respiratory: Negative.   Cardiovascular: Positive for palpitations.       Tachycardia  Gastrointestinal: Negative.   Musculoskeletal: Negative.   Neurological: Positive for weakness.  Psychiatric/Behavioral: Negative.   All other systems reviewed and are negative.    PHYSICAL EXAM: VS:  BP 140/72 (BP Location: Left Arm, Patient Position: Sitting, Cuff Size: Normal)   Pulse (!) 109   Ht '5\' 1"'$  (1.549 m)   Wt 257 lb (116.6 kg)   BMI 48.56 kg/m  , BMI Body mass index is 48.56 kg/m. GEN: Well nourished, well developed, in no acute distress , Obese  HEENT: normal  Neck: no JVD, carotid bruits, or masses Cardiac:Regular rhythm, tachycardia, no murmurs, rubs, or gallops,no edema  Decreased lower extremity pulses  Respiratory:  clear to auscultation bilaterally, normal work of breathing GI: soft, nontender, nondistended, + BS MS: no deformity or atrophy  Skin: warm and dry, no rash Neuro:  Strength and sensation are intact Psych: euthymic mood, full affect    Recent Labs: 07/11/2016: Hemoglobin 10.6 07/18/2016: TSH 3.370 07/25/2016: ALT 16; BUN 22; Creatinine, Ser 1.15; Platelets 253; Potassium 4.1; Sodium 137    Lipid Panel Lab Results  Component Value Date   CHOL 120 07/08/2016   HDL 43 07/08/2016   LDLCALC 69 07/08/2016   TRIG 39 07/08/2016      Wt Readings from Last 3 Encounters:  07/28/16 257 lb (116.6 kg)  07/08/16 270 lb 7 oz (122.7 kg)  07/05/16 269 lb 6.4 oz (122.2 kg)       ASSESSMENT AND PLAN:  Palpitations - Plan: EKG 12-Lead, VAS [place] LOWER  EXTREMITY ARTERIAL DUPLEX  Essential hypertension - Plan: EKG 12-Lead, VAS [place] LOWER EXTREMITY ARTERIAL DUPLEX We will hold HCTZ, start metoprolol as below  Claudication ([place]) - Plan: VAS [place] LOWER EXTREMITY ARTERIAL DUPLEX Ankle-brachial indexes ordered for possible PAD, claudication symptoms She has known carotid disease, cerebrovascular disease  Mixed hyperlipidemia Long discussion with her concerning her lipids. Given diffuse cerebrovascular disease, carotid disease, we will pursue very aggressive lipid management. Recommended she increase Lipitor  up to 40 mg daily  Peripheral artery disease ([place])  Cerebrovascular accident (CVA) due to other mechanism [facility])  Hemispheric carotid artery syndrome  Ischemic bowel disease ([place]) Currently on low-dose aspirin, we'll pursue aggressive lipid management Recent workup by GI including colonoscopy  Type 2 diabetes mellitus with complication, with long-term current use of insulin (H[facility]) Recommended strict low carbohydrate diet  Sinus tachycardia Etiology of her tachycardia is unclear. Anemia essentially resolved after iron infusion Does not appear ill or in distress Recommended she stop the HCTZ, start metoprolol succinate 50 mg daily   Total encounter time more than 25 minutes  Greater than 50% was spent in counseling and coordination of care with the patient  Disposition:   F/U  6 months   Orders Placed This Encounter  Procedures  . EKG 12-Lead     Signed, Esmond Plants, M.D., Ph.D. 07/28/2016  [place], [place]

## 2016-07-31 ENCOUNTER — Encounter: Payer: Self-pay | Admitting: Gastroenterology

## 2016-07-31 ENCOUNTER — Ambulatory Visit (INDEPENDENT_AMBULATORY_CARE_PROVIDER_SITE_OTHER): Payer: PPO | Admitting: Gastroenterology

## 2016-07-31 VITALS — BP 144/61 | HR 103 | Temp 98.8°F | Ht 61.0 in | Wt 256.0 lb

## 2016-07-31 DIAGNOSIS — K559 Vascular disorder of intestine, unspecified: Secondary | ICD-10-CM | POA: Diagnosis not present

## 2016-07-31 DIAGNOSIS — K921 Melena: Secondary | ICD-10-CM | POA: Diagnosis not present
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
## 2016-08-01 ENCOUNTER — Telehealth: Payer: Self-pay | Admitting: Family Medicine

## 2016-08-01 NOTE — Telephone Encounter (Signed)
I saw it come through and it was on green chart please review, it was for back brace I believe-aa

## 2016-08-01 NOTE — Telephone Encounter (Signed)
Susan House at [facility] called wanting to know if we have recd a fax for orthotics for [name]. She stated they faxed it about a week ago.  She is going to refax it again.   Pt ref. # is [id number].  Thanks, [employer]

## 2016-08-02 NOTE — Telephone Encounter (Signed)
That requires face to face I am pretty sure. Did pt ask for brace?

## 2016-08-04 DIAGNOSIS — M79671 Pain in right foot: Secondary | ICD-10-CM | POA: Diagnosis not present

## 2016-08-04 DIAGNOSIS — M10072 Idiopathic gout, left ankle and foot: Secondary | ICD-10-CM | POA: Diagnosis not present

## 2016-08-04 DIAGNOSIS — M79672 Pain in left foot: Secondary | ICD-10-CM | POA: Diagnosis not present

## 2016-08-04 DIAGNOSIS — M10071 Idiopathic gout, right ankle and foot: Secondary | ICD-10-CM | POA: Diagnosis not present

## 2016-08-04 NOTE — Telephone Encounter (Signed)
LMTCB to ask patient if she had requested the back brace.  ED

## 2016-08-07 NOTE — Telephone Encounter (Signed)
Spoke with patient and she states yes she requested this. Called [employer] [employer] to speak to [place]

## 2016-08-08 NOTE — Telephone Encounter (Signed)
Dr Rosanna Randy can you fill out the form and we can send it in and see if it needs an office visit. [employer] [employer] still have not called me back. Patient states she did request this for her back-aa

## 2016-08-08 NOTE — Telephone Encounter (Signed)
I expect this is durable medical equipment and will require a visit just to document the need.

## 2016-08-09 NOTE — Telephone Encounter (Signed)
Spoke with patient today and she will make appointment to address this-aa

## 2016-08-15 ENCOUNTER — Encounter: Payer: Self-pay | Admitting: Family Medicine

## 2016-08-15 ENCOUNTER — Ambulatory Visit (INDEPENDENT_AMBULATORY_CARE_PROVIDER_SITE_OTHER): Payer: PPO | Admitting: Family Medicine

## 2016-08-15 VITALS — BP 162/70 | HR 78 | Temp 97.7°F | Resp 16

## 2016-08-15 DIAGNOSIS — M545 Low back pain: Secondary | ICD-10-CM

## 2016-08-15 DIAGNOSIS — I6389 Other cerebral infarction: Secondary | ICD-10-CM

## 2016-08-15 DIAGNOSIS — I69359 Hemiplegia and hemiparesis following cerebral infarction affecting unspecified side: Secondary | ICD-10-CM | POA: Diagnosis not present

## 2016-08-15 DIAGNOSIS — I638 Other cerebral infarction: Secondary | ICD-10-CM

## 2016-08-15 DIAGNOSIS — G8929 Other chronic pain: Secondary | ICD-10-CM | POA: Diagnosis not present
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
## 2016-08-17 ENCOUNTER — Ambulatory Visit: Payer: PPO

## 2016-08-17 ENCOUNTER — Other Ambulatory Visit: Payer: Self-pay | Admitting: Cardiovascular Disease

## 2016-08-17 DIAGNOSIS — I739 Peripheral vascular disease, unspecified: Secondary | ICD-10-CM

## 2016-08-17 DIAGNOSIS — R0989 Other specified symptoms and signs involving the circulatory and respiratory systems: Secondary | ICD-10-CM

## 2016-08-17 DIAGNOSIS — E1159 Type 2 diabetes mellitus with other circulatory complications: Secondary | ICD-10-CM | POA: Diagnosis not present

## 2016-08-21 DIAGNOSIS — M10071 Idiopathic gout, right ankle and foot: Secondary | ICD-10-CM | POA: Diagnosis not present

## 2016-08-22 DIAGNOSIS — L689 Hypertrichosis, unspecified: Secondary | ICD-10-CM | POA: Diagnosis not present

## 2016-08-23 ENCOUNTER — Telehealth: Payer: Self-pay | Admitting: Emergency Medicine

## 2016-08-23 ENCOUNTER — Ambulatory Visit (INDEPENDENT_AMBULATORY_CARE_PROVIDER_SITE_OTHER): Payer: PPO | Admitting: Family Medicine

## 2016-08-23 ENCOUNTER — Ambulatory Visit
Admission: RE | Admit: 2016-08-23 | Discharge: 2016-08-23 | Disposition: A | Discharge status: Home / Self Care | Payer: PPO | Source: Ambulatory Visit | Attending: Family Medicine | Admitting: Family Medicine

## 2016-08-23 ENCOUNTER — Other Ambulatory Visit: Payer: Self-pay | Admitting: Family Medicine

## 2016-08-23 ENCOUNTER — Encounter: Payer: Self-pay | Admitting: Family Medicine

## 2016-08-23 ENCOUNTER — Ambulatory Visit: Payer: PPO

## 2016-08-23 VITALS — BP 152/80 | HR 90 | Temp 97.7°F | Resp 16

## 2016-08-23 DIAGNOSIS — I739 Peripheral vascular disease, unspecified: Secondary | ICD-10-CM | POA: Diagnosis not present

## 2016-08-23 DIAGNOSIS — M79604 Pain in right leg: Secondary | ICD-10-CM | POA: Insufficient documentation

## 2016-08-23 DIAGNOSIS — M79661 Pain in right lower leg: Secondary | ICD-10-CM | POA: Diagnosis not present

## 2016-08-23 NOTE — Telephone Encounter (Signed)
Radiology called to let us know that pt Doppler was negative for DVT. Thanks.
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
## 2016-09-06 DIAGNOSIS — M545 Low back pain: Secondary | ICD-10-CM | POA: Diagnosis not present

## 2016-09-07 ENCOUNTER — Ambulatory Visit (INDEPENDENT_AMBULATORY_CARE_PROVIDER_SITE_OTHER): Payer: PPO | Admitting: Family Medicine

## 2016-09-07 ENCOUNTER — Encounter: Payer: Self-pay | Admitting: Family Medicine

## 2016-09-07 VITALS — BP 122/56 | HR 79 | Temp 98.5°F | Resp 16 | Wt 260.2 lb

## 2016-09-07 DIAGNOSIS — N3090 Cystitis, unspecified without hematuria: Secondary | ICD-10-CM | POA: Diagnosis not present

## 2016-09-07 DIAGNOSIS — H1032 Unspecified acute conjunctivitis, left eye: Secondary | ICD-10-CM | POA: Diagnosis not present

## 2016-09-07 LAB — POCT URINALYSIS DIPSTICK
Bilirubin, UA: NEGATIVE
Glucose, UA: NEGATIVE
Ketones, UA: NEGATIVE
Nitrite, UA: POSITIVE
PROTEIN UA: 300
SPEC GRAV UA: 1.01
UROBILINOGEN UA: 0.2
pH, UA: 6

## 2016-09-07 MED ORDER — SULFACETAMIDE SODIUM 10 % OP SOLN: 2.0000 [drp] | Freq: Four times a day (QID) | OPHTHALMIC | 0 refills | Status: DC

## 2016-09-07 MED ORDER — SULFAMETHOXAZOLE-TRIMETHOPRIM 800-160 MG PO TABS: 1.0000 | ORAL_TABLET | Freq: Two times a day (BID) | ORAL | 0 refills | Status: DC

## 2016-09-07 NOTE — Progress Notes (Signed)
Subjective:     Patient ID: Susan House, female   DOB: [birth year], 61 y.o.   MRN: [id number]  HPI  Chief Complaint  Patient presents with  . Dysuria    Patient comes in office today with concerns of a urinary tract infection. Patient reports for the past three days she has suffered with dysuria, frequency of urination and incontinence.   . Eye Pain    Patient reports pain in the left eye and itching that began this morning. Patient denies blurred vision or discharge from eye.   States she was around her granddaughter who was recovering from a cold. Accompanied by her husband today. Prior urine culture with two organisms.   Review of Systems     Objective:   Physical Exam  Constitutional: She appears well-developed and well-nourished. No distress.  Eyes: Pupils are equal, round, and reactive to light.  Mild left scleral injection; no f.b.visualized. V.A. Intact to # of fingers. Tactile pressures equal.  Genitourinary:  Genitourinary Comments: No c.v.a. tenderness       Assessment:    1. Cystitis - POCT urinalysis dipstick - Urine culture - sulfamethoxazole-trimethoprim (BACTRIM DS,SEPTRA DS) 800-160 MG tablet; Take 1 tablet by mouth 2 (two) times daily.  Dispense: 14 tablet; Refill: 0  2. Acute conjunctivitis of left eye, unspecified acute conjunctivitis type - sulfacetamide (BLEPH-10) 10 % ophthalmic solution; Place 2 drops into the left eye 4 (four) times daily.  Dispense: 15 mL; Refill: 0    Plan:    Frequent warm compresses to left eye. If matting or not improving in 24 hours to start eye abx.

## 2016-09-07 NOTE — Patient Instructions (Signed)
Start warm, wet compresses to left eye several x day. If matting or not improving in 24 hours start the eye drops. We will cal you with the culture results.

## 2016-09-08 IMAGING — CR DG LUMBAR SPINE COMPLETE 4+V
1 series · 5 of 5 positions shown · non-contrast
Comparison: CT 06/10/2014.

CLINICAL DATA: Chronic low back pain .

EXAM:
LUMBAR SPINE - COMPLETE 4+ VIEW

[Series 1: kdxr lumbar spine with obliques · 0.14mm/px · 5 of 5 slices shown]
[im 1/5]
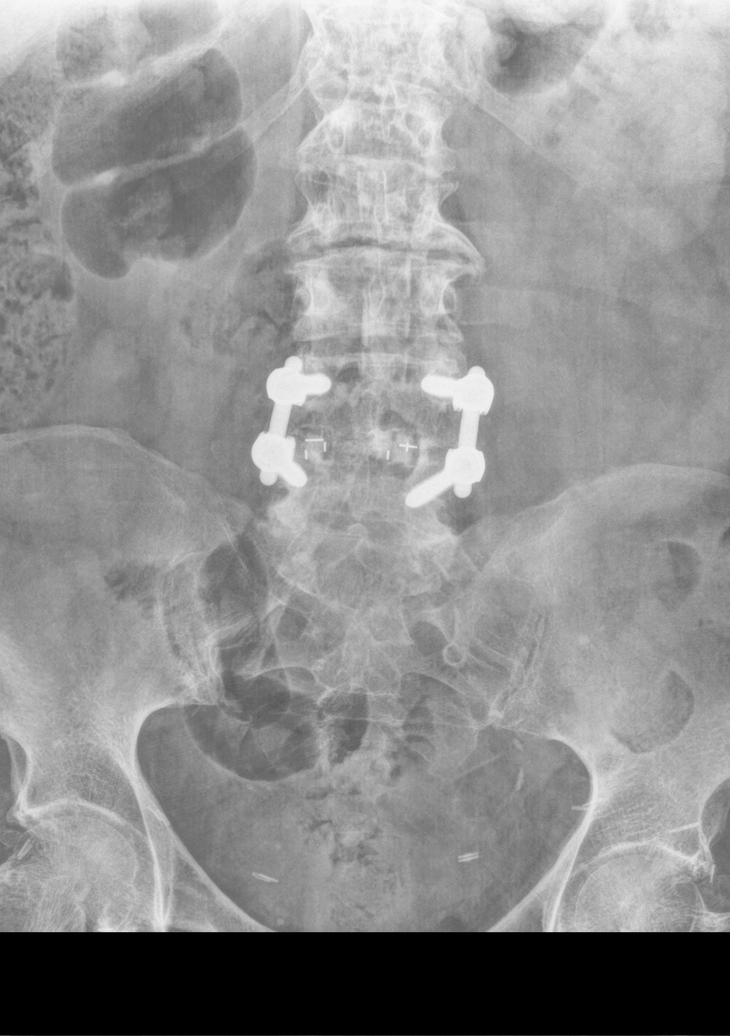
[im 2/5]
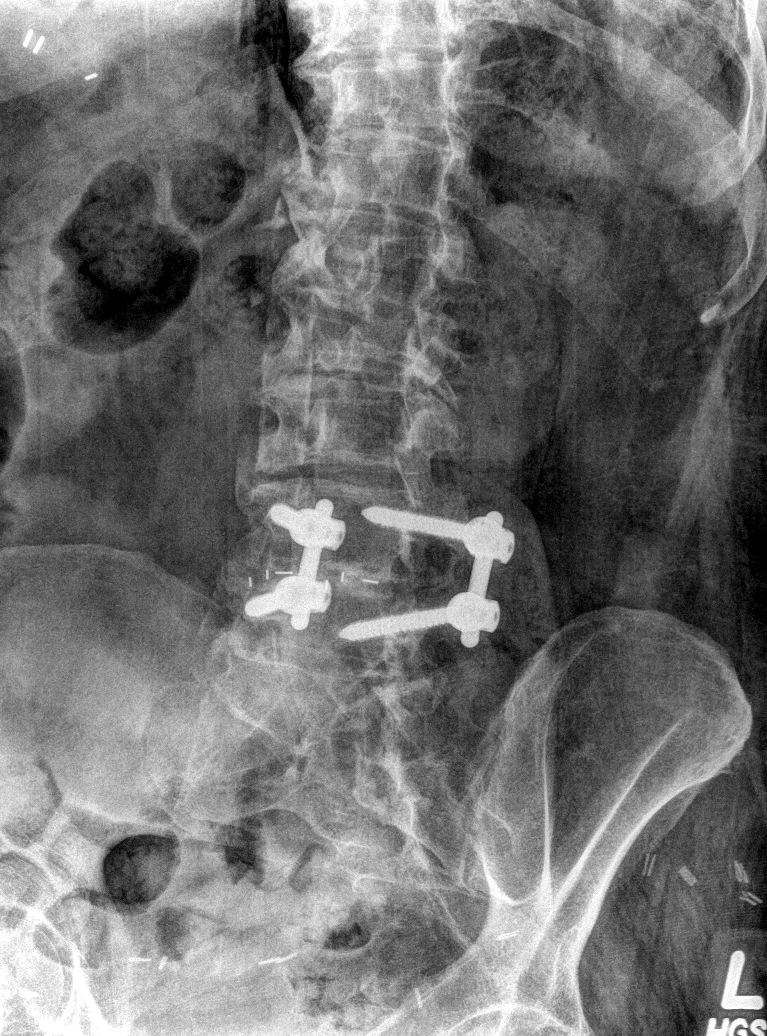
[im 3/5]
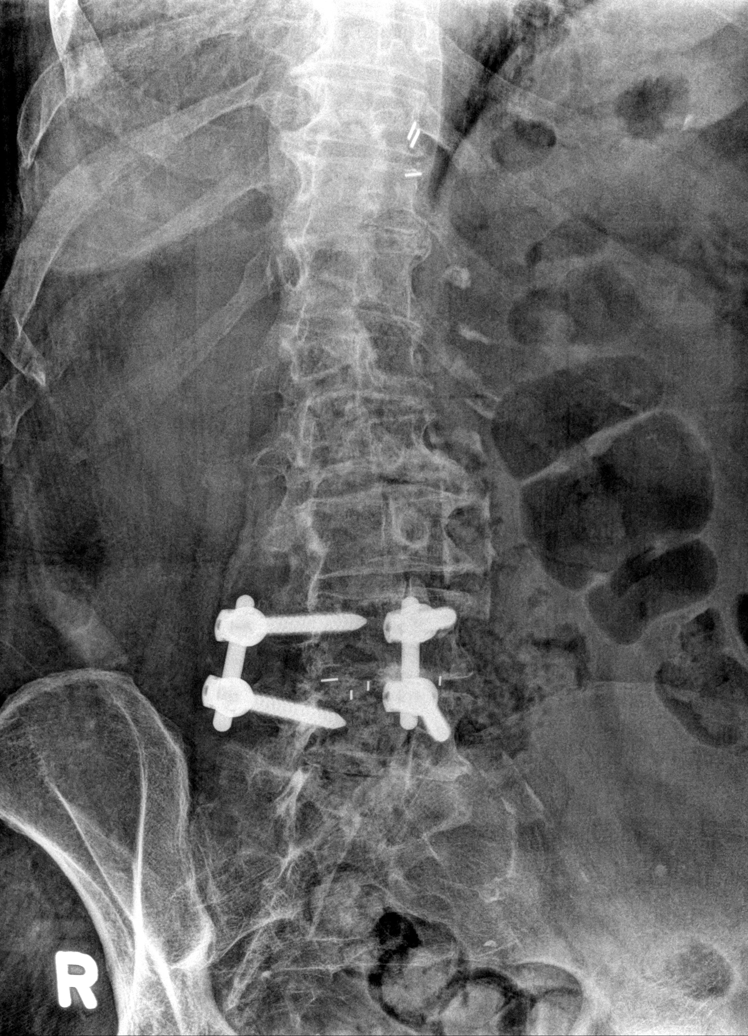
[im 4/5]
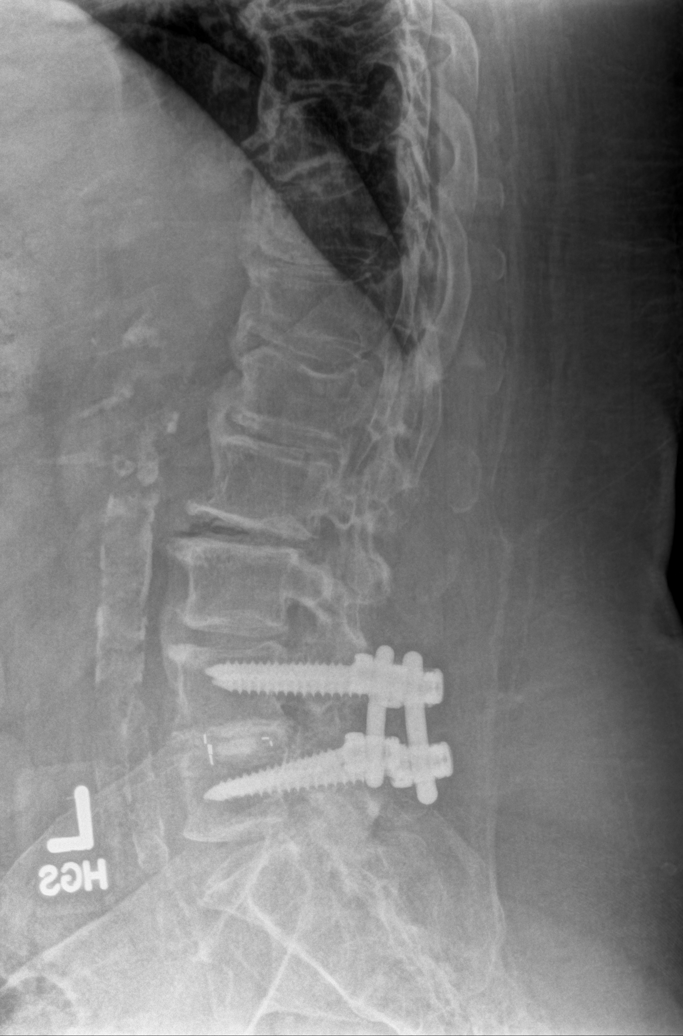
[im 5/5]
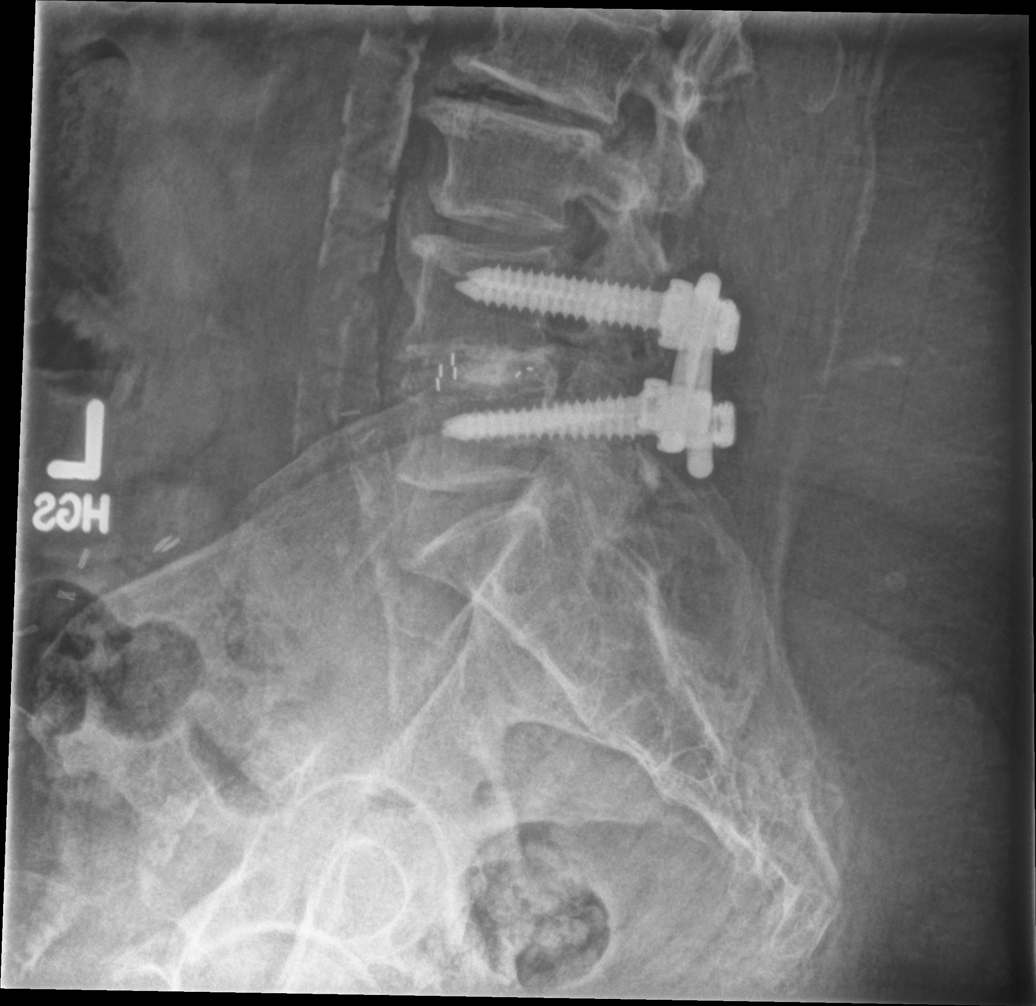

[5 of 5 positions shown; findings below may reference images not displayed]

FINDINGS: Paraspinal soft tissues are normal. Diffuse severe degenerative
changes lumbar spine. L4-L5 posterior interbody fusion. Degenerative
changes both hips. Surgical clips in the pelvis. Aortoiliac
atherosclerotic vascular disease.
IMPRESSION: 1. Prior posterior and interbody fusion L4-L5. Diffuse osteopenia
and degenerative change. No acute bony abnormality.

2. Aortoiliac atherosclerotic vascular disease.

## 2016-09-08 IMAGING — CR RIGHT HIP - COMPLETE 2+ VIEW
1 series · 3 of 3 positions shown · non-contrast
Comparison: CT 06/10/2014.

CLINICAL DATA: Right hip pain. No known injury. Initial evaluation.

EXAM:
RIGHT HIP (WITH PELVIS) 2-3 VIEWS

[Series 1: kdxr hip right complete · 0.14mm/px · 3 of 3 slices shown]
[im 1/3]
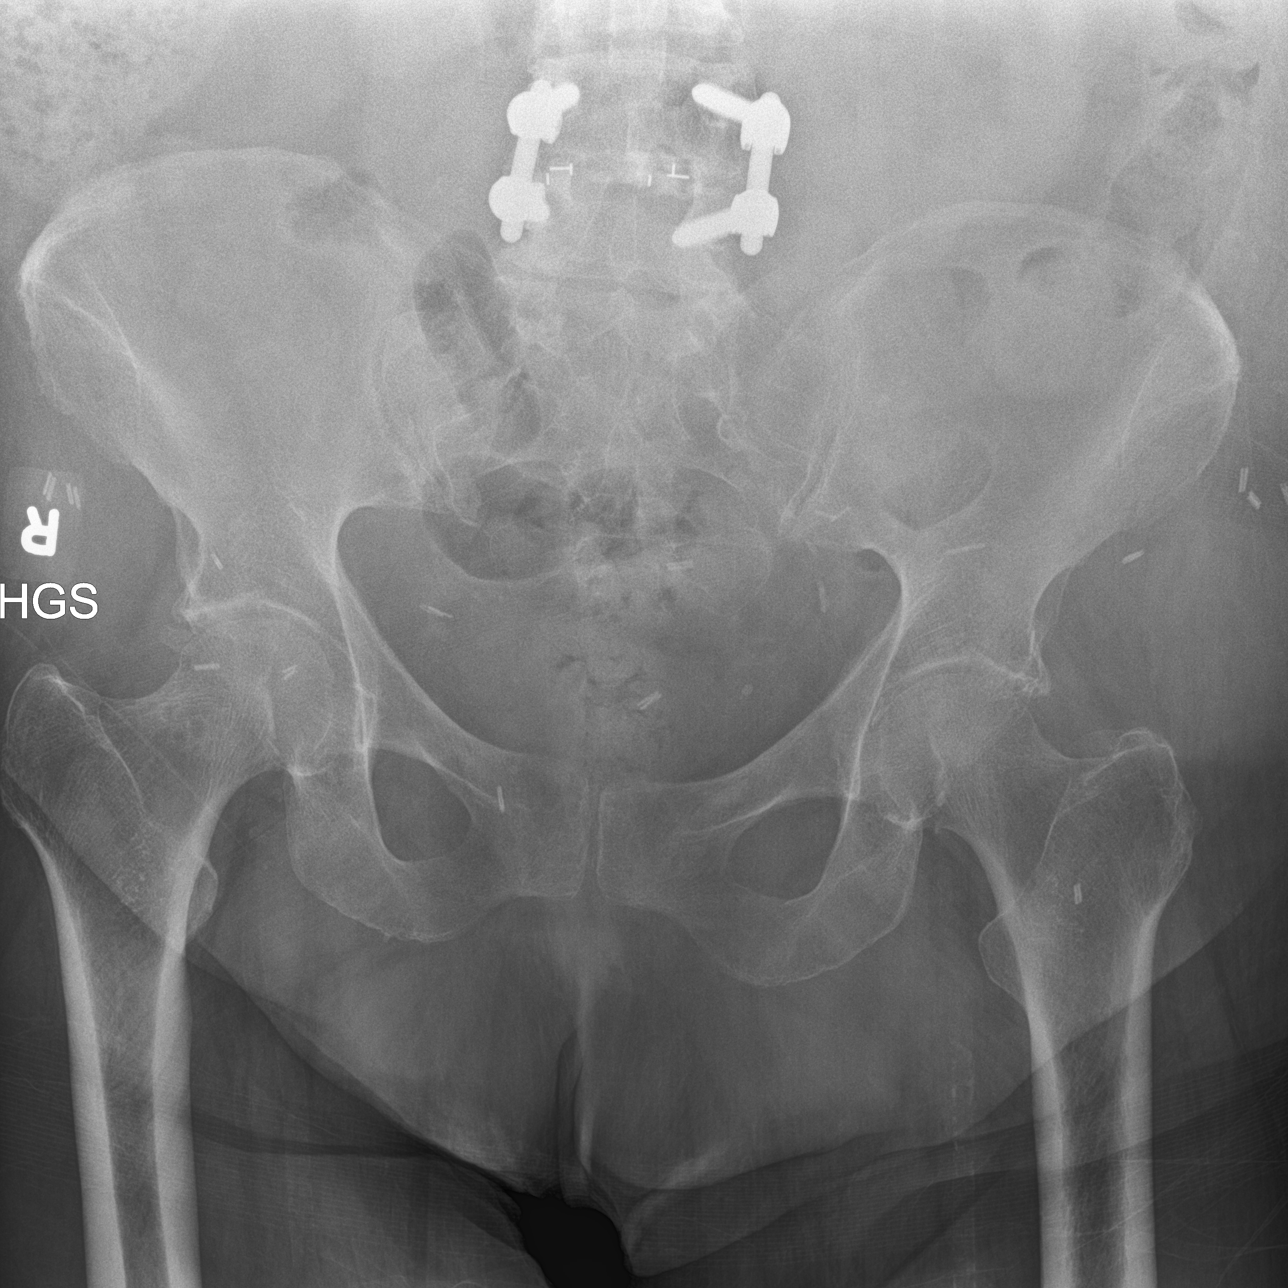
[im 2/3]
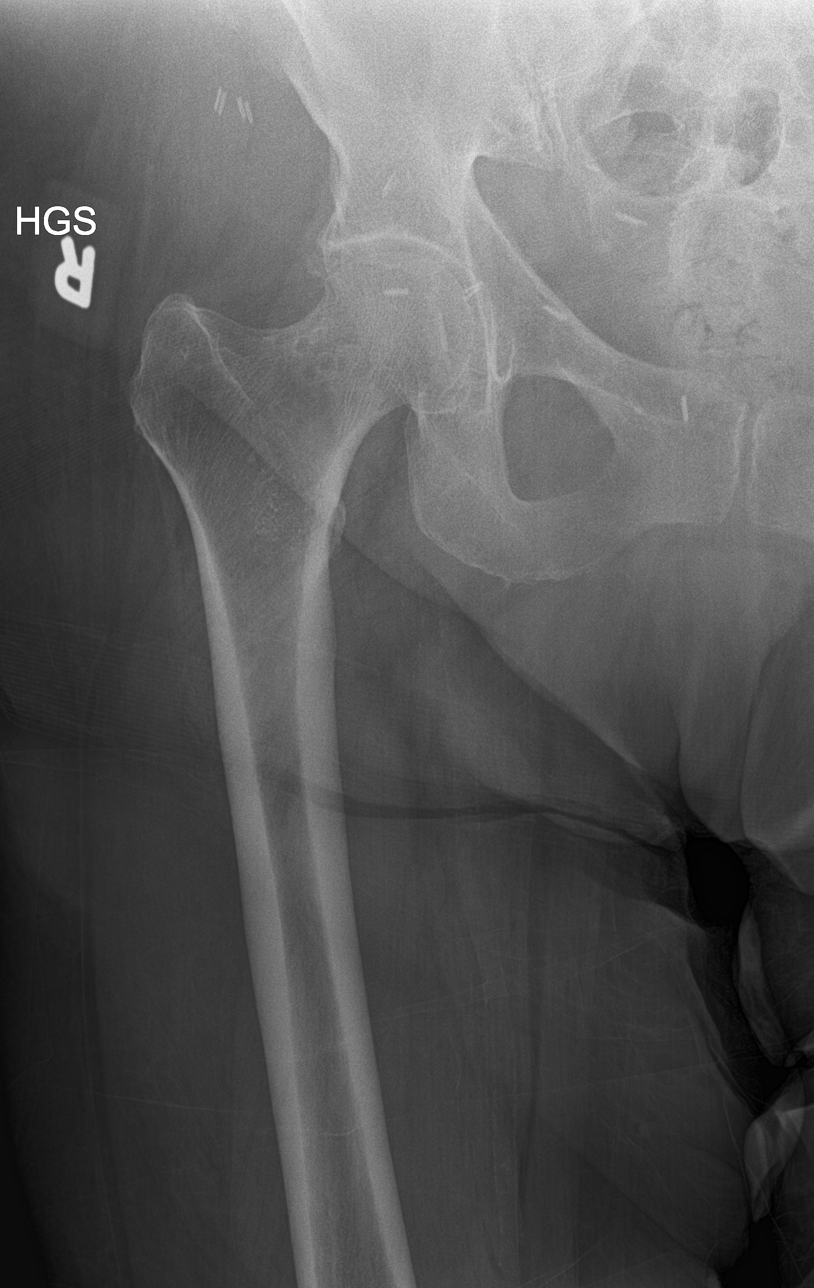
[im 3/3]
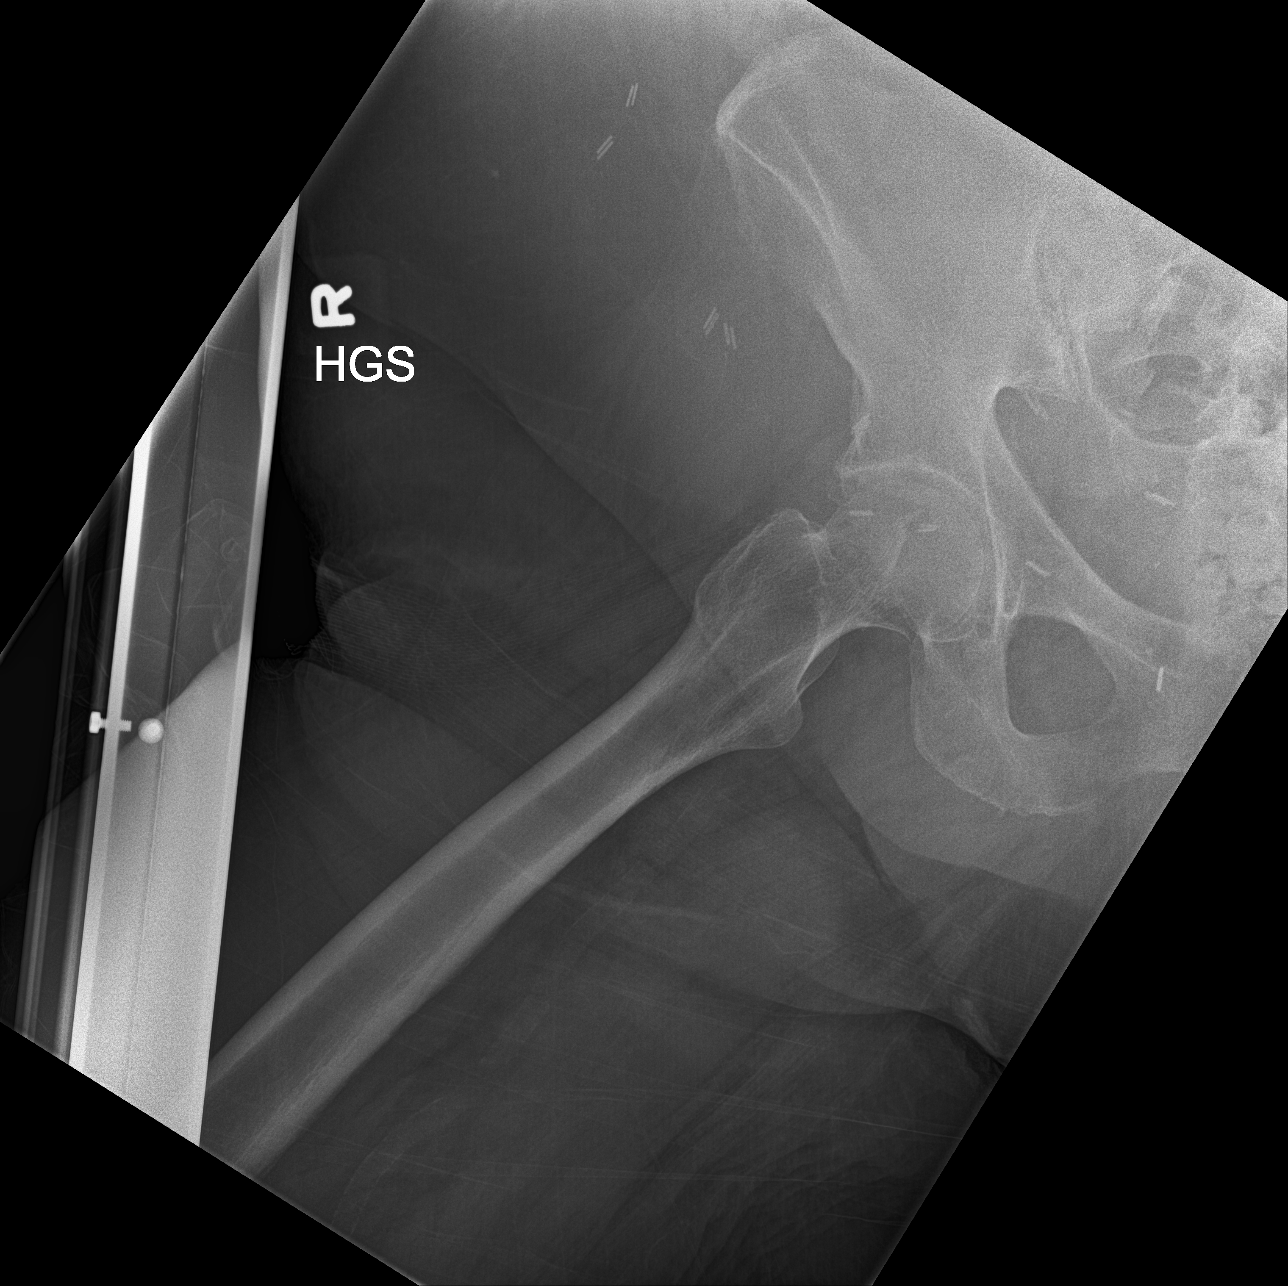

[3 of 3 positions shown; findings below may reference images not displayed]

FINDINGS: No acute bony abnormality identified. Postsurgical changes noted of
the lumbar spine. Surgical clips over the pelvis . Degenerative
changes both hips. Peripheral vascular calcification .
IMPRESSION: 1. Prior L4-L5 fusion. Surgical clips in the pelvis. Degenerative
changes both hips. No acute bony abnormality .

2.  Peripheral vascular disease.

## 2016-09-09 LAB — URINE CULTURE

## 2016-09-11 ENCOUNTER — Telehealth: Payer: Self-pay

## 2016-09-11 NOTE — Telephone Encounter (Signed)
Pt advised. Emily Drozdowski, CMA  

## 2016-09-11 NOTE — Telephone Encounter (Signed)
-----   Message from Carmon Ginsberg, [place] sent at 09/11/2016  7:37 AM EST ----- Klebsiella infection which is sensitive to the Sulfa antibiotiic.

## 2016-09-11 NOTE — Telephone Encounter (Signed)
Lmtcb. Renaldo Fiddler, CMA

## 2016-09-14 ENCOUNTER — Other Ambulatory Visit: Payer: Self-pay | Admitting: Family Medicine

## 2016-09-14 DIAGNOSIS — M5441 Lumbago with sciatica, right side: Principal | ICD-10-CM

## 2016-09-14 DIAGNOSIS — G8929 Other chronic pain: Secondary | ICD-10-CM

## 2016-09-14 DIAGNOSIS — M5442 Lumbago with sciatica, left side: Principal | ICD-10-CM

## 2016-09-14 NOTE — Telephone Encounter (Signed)
Please review-aa 

## 2016-09-14 NOTE — Telephone Encounter (Signed)
Pt called requesting a refill of HYDROcodone-acetaminophen (NORCO) 10-325 MG tablet.  States she has enough for a few more days.  Has not been taking the oxycodone because it is not working well for her.

## 2016-09-15 ENCOUNTER — Other Ambulatory Visit: Payer: Self-pay

## 2016-09-15 MED ORDER — ENALAPRIL MALEATE 20 MG PO TABS: 20.0000 mg | ORAL_TABLET | Freq: Two times a day (BID) | ORAL | 0 refills | Status: DC

## 2016-09-15 NOTE — Telephone Encounter (Signed)
Dr. Rosanna Randy pt.   Thanks,   -Mickel Baas

## 2016-09-18 MED ORDER — HYDROCODONE-ACETAMINOPHEN 10-325 MG PO TABS: 1.0000 | ORAL_TABLET | ORAL | 0 refills | Status: DC | PRN

## 2016-09-18 NOTE — Telephone Encounter (Signed)
Pt informed. Ready for pick up.

## 2016-09-18 NOTE — Telephone Encounter (Signed)
Printed, needs signature.

## 2016-09-18 NOTE — Telephone Encounter (Signed)
ok 

## 2016-09-19 ENCOUNTER — Ambulatory Visit (INDEPENDENT_AMBULATORY_CARE_PROVIDER_SITE_OTHER): Payer: PPO | Admitting: Family Medicine

## 2016-09-19 ENCOUNTER — Encounter: Payer: Self-pay | Admitting: Family Medicine

## 2016-09-19 VITALS — BP 140/82 | HR 82 | Temp 99.0°F | Resp 16

## 2016-09-19 DIAGNOSIS — L309 Dermatitis, unspecified: Secondary | ICD-10-CM | POA: Diagnosis not present

## 2016-09-19 DIAGNOSIS — N3090 Cystitis, unspecified without hematuria: Secondary | ICD-10-CM

## 2016-09-19 LAB — POCT URINALYSIS DIPSTICK
Glucose, UA: NEGATIVE
Nitrite, UA: POSITIVE
Protein, UA: 300
Spec Grav, UA: >=1.03
Urobilinogen, UA: 0.2
pH, UA: 5

## 2016-09-19 MED ORDER — SULFAMETHOXAZOLE-TRIMETHOPRIM 800-160 MG PO TABS: 1.0000 | ORAL_TABLET | Freq: Two times a day (BID) | ORAL | 0 refills | Status: DC

## 2016-09-19 MED ORDER — DOXEPIN HCL 5 % EX CREA: 1.0000 "application " | TOPICAL_CREAM | Freq: Four times a day (QID) | CUTANEOUS | 2 refills | Status: DC

## 2016-09-19 NOTE — Patient Instructions (Signed)
We will call you with the urine culture result. Try Claritin during the day and Benadryl at night for itching from eczema.

## 2016-09-19 NOTE — Progress Notes (Signed)
Subjective:     Patient ID: Susan House, female   DOB: Oct 21, 1955, 61 y.o.   MRN: [id number]  HPI  Chief Complaint  Patient presents with  . Urinary Tract Infection    Patient returns to clinic today with concerns of urinary infection. Patient complains of frequency and dysuria.   . Eczema    Patient would like to address eczema on arms, she states that she was previously on a prescription cream and has been out of it for some time. Patient reports she has been taking otc Benadryl for relief.   States she was better for two weeks after prior treatment with Septra for cystitis on 09/07/16. Currently on Amoxil for a dental problem but prior organism was resistant to ampicillin. Also wishes additional medication to treat the pruritis associated with her life long eczema. Accompanied by her husband today.  Review of Systems     Objective:   Physical Exam  Constitutional: She appears well-developed and well-nourished. No distress.  Genitourinary:  Genitourinary Comments: No cva tenderness on percussion  Skin:  A few patches of rash noted on her upper extremties       Assessment:    1. Cystitis - POCT urinalysis dipstick - Urine culture - sulfamethoxazole-trimethoprim (BACTRIM DS,SEPTRA DS) 800-160 MG tablet; Take 1 tablet by mouth 2 (two) times daily.  Dispense: 14 tablet; Refill: 0  2. Eczema, unspecified type - Doxepin HCl (ZONALON) 5 % CREA; Apply 1 application topically 4 (four) times daily.  Dispense: 45 g; Refill: 2    Plan:    Also discussed use of oral Claritin during the day and Benadryl at night.

## 2016-09-22 ENCOUNTER — Telehealth: Payer: Self-pay

## 2016-09-22 ENCOUNTER — Other Ambulatory Visit: Payer: Self-pay | Admitting: Family Medicine

## 2016-09-22 DIAGNOSIS — N3090 Cystitis, unspecified without hematuria: Secondary | ICD-10-CM

## 2016-09-22 LAB — URINE CULTURE

## 2016-09-22 MED ORDER — NITROFURANTOIN MONOHYD MACRO 100 MG PO CAPS: 100.0000 mg | ORAL_CAPSULE | Freq: Two times a day (BID) | ORAL | 0 refills | Status: DC

## 2016-09-22 NOTE — Telephone Encounter (Signed)
Let her know that so far today I have only received a preliminary result of E. Coli but no sensitivities yet.

## 2016-09-22 NOTE — Telephone Encounter (Signed)
Patient called to check on urine culture results. =aa

## 2016-09-22 NOTE — Telephone Encounter (Signed)
Pt informed and voiced understanding of results. 

## 2016-09-26 ENCOUNTER — Telehealth: Payer: Self-pay | Admitting: Family Medicine

## 2016-09-26 NOTE — Telephone Encounter (Signed)
Called Pt to re-schedule AWV with [facility] - knb

## 2016-10-03 ENCOUNTER — Ambulatory Visit: Payer: PPO | Admitting: Family Medicine

## 2016-10-03 ENCOUNTER — Ambulatory Visit: Payer: PPO

## 2016-10-04 ENCOUNTER — Ambulatory Visit: Payer: PPO | Admitting: Family Medicine

## 2016-10-04 ENCOUNTER — Ambulatory Visit: Payer: PPO

## 2016-10-09 ENCOUNTER — Ambulatory Visit: Payer: Self-pay | Admitting: Family Medicine

## 2016-10-10 ENCOUNTER — Ambulatory Visit (INDEPENDENT_AMBULATORY_CARE_PROVIDER_SITE_OTHER): Payer: PPO | Admitting: Family Medicine

## 2016-10-10 VITALS — BP 132/70 | HR 81 | Temp 98.1°F | Resp 16

## 2016-10-10 DIAGNOSIS — R079 Chest pain, unspecified: Secondary | ICD-10-CM

## 2016-10-10 DIAGNOSIS — I638 Other cerebral infarction: Secondary | ICD-10-CM

## 2016-10-10 DIAGNOSIS — E118 Type 2 diabetes mellitus with unspecified complications: Secondary | ICD-10-CM

## 2016-10-10 DIAGNOSIS — R071 Chest pain on breathing: Secondary | ICD-10-CM

## 2016-10-10 DIAGNOSIS — N3941 Urge incontinence: Secondary | ICD-10-CM

## 2016-10-10 DIAGNOSIS — N3001 Acute cystitis with hematuria: Secondary | ICD-10-CM

## 2016-10-10 DIAGNOSIS — Z794 Long term (current) use of insulin: Secondary | ICD-10-CM

## 2016-10-10 DIAGNOSIS — I6389 Other cerebral infarction: Secondary | ICD-10-CM

## 2016-10-10 DIAGNOSIS — R0789 Other chest pain: Secondary | ICD-10-CM

## 2016-10-10 LAB — POCT URINALYSIS DIPSTICK
Bilirubin, UA: NEGATIVE
GLUCOSE UA: NEGATIVE
Ketones, UA: NEGATIVE
Nitrite, UA: POSITIVE
PH UA: 6 (ref 5.0–8.0)
SPEC GRAV UA: 1.03 (ref 1.030–1.035)
Urobilinogen, UA: 0.2 (ref ?–2.0)

## 2016-10-10 MED ORDER — CIPROFLOXACIN HCL 500 MG PO TABS: 500.0000 mg | ORAL_TABLET | Freq: Two times a day (BID) | ORAL | 4 refills | Status: DC
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
## 2016-10-12 ENCOUNTER — Ambulatory Visit: Payer: PPO | Admitting: Family Medicine

## 2016-10-12 ENCOUNTER — Ambulatory Visit: Payer: PPO

## 2016-10-12 LAB — URINE CULTURE

## 2016-10-15 ENCOUNTER — Emergency Department: Payer: PPO

## 2016-10-15 ENCOUNTER — Observation Stay
Admission: EM | Admit: 2016-10-15 | Discharge: 2016-10-17 | Disposition: A | Discharge status: Home / Self Care | Payer: PPO | Attending: Internal Medicine | Admitting: Internal Medicine

## 2016-10-15 DIAGNOSIS — M1990 Unspecified osteoarthritis, unspecified site: Secondary | ICD-10-CM | POA: Insufficient documentation

## 2016-10-15 DIAGNOSIS — K589 Irritable bowel syndrome without diarrhea: Secondary | ICD-10-CM | POA: Diagnosis not present

## 2016-10-15 DIAGNOSIS — G9341 Metabolic encephalopathy: Secondary | ICD-10-CM | POA: Insufficient documentation

## 2016-10-15 DIAGNOSIS — I700 Atherosclerosis of aorta: Secondary | ICD-10-CM | POA: Diagnosis not present

## 2016-10-15 DIAGNOSIS — Z87440 Personal history of urinary (tract) infections: Secondary | ICD-10-CM | POA: Insufficient documentation

## 2016-10-15 DIAGNOSIS — Z885 Allergy status to narcotic agent status: Secondary | ICD-10-CM | POA: Insufficient documentation

## 2016-10-15 DIAGNOSIS — I69354 Hemiplegia and hemiparesis following cerebral infarction affecting left non-dominant side: Secondary | ICD-10-CM | POA: Insufficient documentation

## 2016-10-15 DIAGNOSIS — F320 Major depressive disorder, single episode, mild: Secondary | ICD-10-CM | POA: Insufficient documentation

## 2016-10-15 DIAGNOSIS — F4322 Adjustment disorder with anxiety: Secondary | ICD-10-CM | POA: Insufficient documentation

## 2016-10-15 DIAGNOSIS — B373 Candidiasis of vulva and vagina: Secondary | ICD-10-CM | POA: Insufficient documentation

## 2016-10-15 DIAGNOSIS — N3941 Urge incontinence: Secondary | ICD-10-CM | POA: Diagnosis not present

## 2016-10-15 DIAGNOSIS — I6529 Occlusion and stenosis of unspecified carotid artery: Secondary | ICD-10-CM | POA: Insufficient documentation

## 2016-10-15 DIAGNOSIS — I639 Cerebral infarction, unspecified: Secondary | ICD-10-CM | POA: Diagnosis not present

## 2016-10-15 DIAGNOSIS — Z841 Family history of disorders of kidney and ureter: Secondary | ICD-10-CM | POA: Insufficient documentation

## 2016-10-15 DIAGNOSIS — R319 Hematuria, unspecified: Secondary | ICD-10-CM | POA: Insufficient documentation

## 2016-10-15 DIAGNOSIS — I878 Other specified disorders of veins: Secondary | ICD-10-CM | POA: Insufficient documentation

## 2016-10-15 DIAGNOSIS — R0602 Shortness of breath: Secondary | ICD-10-CM | POA: Diagnosis not present

## 2016-10-15 DIAGNOSIS — M5441 Lumbago with sciatica, right side: Secondary | ICD-10-CM

## 2016-10-15 DIAGNOSIS — I10 Essential (primary) hypertension: Secondary | ICD-10-CM | POA: Diagnosis not present

## 2016-10-15 DIAGNOSIS — R351 Nocturia: Secondary | ICD-10-CM | POA: Insufficient documentation

## 2016-10-15 DIAGNOSIS — G8929 Other chronic pain: Secondary | ICD-10-CM | POA: Insufficient documentation

## 2016-10-15 DIAGNOSIS — D649 Anemia, unspecified: Secondary | ICD-10-CM | POA: Diagnosis not present

## 2016-10-15 DIAGNOSIS — Z91041 Radiographic dye allergy status: Secondary | ICD-10-CM | POA: Insufficient documentation

## 2016-10-15 DIAGNOSIS — M5136 Other intervertebral disc degeneration, lumbar region: Secondary | ICD-10-CM | POA: Insufficient documentation

## 2016-10-15 DIAGNOSIS — R1031 Right lower quadrant pain: Secondary | ICD-10-CM | POA: Diagnosis not present

## 2016-10-15 DIAGNOSIS — R569 Unspecified convulsions: Secondary | ICD-10-CM | POA: Diagnosis not present

## 2016-10-15 DIAGNOSIS — E039 Hypothyroidism, unspecified: Secondary | ICD-10-CM | POA: Diagnosis not present

## 2016-10-15 DIAGNOSIS — E1151 Type 2 diabetes mellitus with diabetic peripheral angiopathy without gangrene: Secondary | ICD-10-CM | POA: Diagnosis not present

## 2016-10-15 DIAGNOSIS — Z8614 Personal history of Methicillin resistant Staphylococcus aureus infection: Secondary | ICD-10-CM | POA: Diagnosis not present

## 2016-10-15 DIAGNOSIS — M5442 Lumbago with sciatica, left side: Secondary | ICD-10-CM

## 2016-10-15 DIAGNOSIS — I69393 Ataxia following cerebral infarction: Secondary | ICD-10-CM | POA: Diagnosis not present

## 2016-10-15 DIAGNOSIS — R109 Unspecified abdominal pain: Secondary | ICD-10-CM | POA: Diagnosis not present

## 2016-10-15 DIAGNOSIS — Z881 Allergy status to other antibiotic agents status: Secondary | ICD-10-CM | POA: Insufficient documentation

## 2016-10-15 DIAGNOSIS — Z9849 Cataract extraction status, unspecified eye: Secondary | ICD-10-CM | POA: Insufficient documentation

## 2016-10-15 DIAGNOSIS — G459 Transient cerebral ischemic attack, unspecified: Secondary | ICD-10-CM

## 2016-10-15 DIAGNOSIS — L309 Dermatitis, unspecified: Secondary | ICD-10-CM | POA: Insufficient documentation

## 2016-10-15 DIAGNOSIS — E118 Type 2 diabetes mellitus with unspecified complications: Secondary | ICD-10-CM

## 2016-10-15 DIAGNOSIS — G43909 Migraine, unspecified, not intractable, without status migrainosus: Secondary | ICD-10-CM | POA: Insufficient documentation

## 2016-10-15 DIAGNOSIS — E785 Hyperlipidemia, unspecified: Secondary | ICD-10-CM | POA: Diagnosis not present

## 2016-10-15 DIAGNOSIS — R079 Chest pain, unspecified: Secondary | ICD-10-CM | POA: Diagnosis not present

## 2016-10-15 DIAGNOSIS — G4700 Insomnia, unspecified: Secondary | ICD-10-CM | POA: Insufficient documentation

## 2016-10-15 DIAGNOSIS — Z888 Allergy status to other drugs, medicaments and biological substances status: Secondary | ICD-10-CM | POA: Insufficient documentation

## 2016-10-15 DIAGNOSIS — G2581 Restless legs syndrome: Secondary | ICD-10-CM | POA: Insufficient documentation

## 2016-10-15 DIAGNOSIS — Z794 Long term (current) use of insulin: Secondary | ICD-10-CM | POA: Insufficient documentation

## 2016-10-15 DIAGNOSIS — E860 Dehydration: Secondary | ICD-10-CM | POA: Diagnosis not present

## 2016-10-15 DIAGNOSIS — E1149 Type 2 diabetes mellitus with other diabetic neurological complication: Secondary | ICD-10-CM | POA: Insufficient documentation

## 2016-10-15 DIAGNOSIS — L03116 Cellulitis of left lower limb: Secondary | ICD-10-CM | POA: Insufficient documentation

## 2016-10-15 DIAGNOSIS — Z8261 Family history of arthritis: Secondary | ICD-10-CM | POA: Insufficient documentation

## 2016-10-15 DIAGNOSIS — E11319 Type 2 diabetes mellitus with unspecified diabetic retinopathy without macular edema: Secondary | ICD-10-CM | POA: Diagnosis not present

## 2016-10-15 DIAGNOSIS — Z7982 Long term (current) use of aspirin: Secondary | ICD-10-CM | POA: Insufficient documentation

## 2016-10-15 DIAGNOSIS — Z90710 Acquired absence of both cervix and uterus: Secondary | ICD-10-CM | POA: Insufficient documentation

## 2016-10-15 DIAGNOSIS — K219 Gastro-esophageal reflux disease without esophagitis: Secondary | ICD-10-CM | POA: Insufficient documentation

## 2016-10-15 DIAGNOSIS — R4182 Altered mental status, unspecified: Secondary | ICD-10-CM | POA: Diagnosis not present

## 2016-10-15 DIAGNOSIS — M549 Dorsalgia, unspecified: Secondary | ICD-10-CM | POA: Insufficient documentation

## 2016-10-15 DIAGNOSIS — Z7901 Long term (current) use of anticoagulants: Secondary | ICD-10-CM | POA: Insufficient documentation

## 2016-10-15 DIAGNOSIS — I872 Venous insufficiency (chronic) (peripheral): Secondary | ICD-10-CM | POA: Insufficient documentation

## 2016-10-15 DIAGNOSIS — Z8249 Family history of ischemic heart disease and other diseases of the circulatory system: Secondary | ICD-10-CM | POA: Insufficient documentation

## 2016-10-15 DIAGNOSIS — Z7902 Long term (current) use of antithrombotics/antiplatelets: Secondary | ICD-10-CM | POA: Insufficient documentation

## 2016-10-15 DIAGNOSIS — Z833 Family history of diabetes mellitus: Secondary | ICD-10-CM | POA: Insufficient documentation

## 2016-10-15 DIAGNOSIS — Z981 Arthrodesis status: Secondary | ICD-10-CM | POA: Insufficient documentation

## 2016-10-15 DIAGNOSIS — Z6841 Body Mass Index (BMI) 40.0 and over, adult: Secondary | ICD-10-CM | POA: Insufficient documentation

## 2016-10-15 DIAGNOSIS — E876 Hypokalemia: Secondary | ICD-10-CM | POA: Insufficient documentation

## 2016-10-15 DIAGNOSIS — Z823 Family history of stroke: Secondary | ICD-10-CM | POA: Insufficient documentation

## 2016-10-15 LAB — CBC WITH DIFFERENTIAL/PLATELET
BASOS ABS: 0 10*3/uL (ref 0–0.1)
BASOS PCT: 1 %
Eosinophils Absolute: 0.2 10*3/uL (ref 0–0.7)
Eosinophils Relative: 2 %
HEMATOCRIT: 40.5 % (ref 35.0–47.0)
HEMOGLOBIN: 13.4 g/dL (ref 12.0–16.0)
LYMPHS PCT: 20 %
Lymphs Abs: 1.9 10*3/uL (ref 1.0–3.6)
MCH: 27.8 pg (ref 26.0–34.0)
MCHC: 33 g/dL (ref 32.0–36.0)
MCV: 84.3 fL (ref 80.0–100.0)
MONOS PCT: 9 %
Monocytes Absolute: 0.8 10*3/uL (ref 0.2–0.9)
NEUTROS ABS: 6.7 10*3/uL — AB (ref 1.4–6.5)
NEUTROS PCT: 68 %
Platelets: 298 10*3/uL (ref 150–440)
RBC: 4.81 MIL/uL (ref 3.80–5.20)
RDW: 17.5 % — ABNORMAL HIGH (ref 11.5–14.5)
WBC: 9.7 10*3/uL (ref 3.6–11.0)

## 2016-10-15 LAB — BASIC METABOLIC PANEL
Anion gap: 11 (ref 5–15)
BUN: 17 mg/dL (ref 6–20)
CALCIUM: 9.3 mg/dL (ref 8.9–10.3)
CHLORIDE: 105 mmol/L (ref 101–111)
CO2: 22 mmol/L (ref 22–32)
CREATININE: 0.83 mg/dL (ref 0.44–1.00)
GFR calc non Af Amer: >60 mL/min (ref >60)
GLUCOSE: 240 mg/dL — AB (ref 65–99)
Potassium: 3.7 mmol/L (ref 3.5–5.1)
SODIUM: 138 mmol/L (ref 135–145)

## 2016-10-15 LAB — URINALYSIS, ROUTINE W REFLEX MICROSCOPIC
Bacteria, UA: NONE SEEN
Bilirubin Urine: NEGATIVE
Glucose, UA: 50 mg/dL — AB
Ketones, ur: NEGATIVE mg/dL
Leukocytes, UA: NEGATIVE
NITRITE: NEGATIVE
PH: 5 (ref 5.0–8.0)
PROTEIN: 100 mg/dL — AB
Specific Gravity, Urine: >1.046 — ABNORMAL HIGH (ref 1.005–1.030)

## 2016-10-15 LAB — GLUCOSE, CAPILLARY: GLUCOSE-CAPILLARY: 237 mg/dL — AB (ref 65–99)

## 2016-10-15 LAB — LIPID PANEL
Cholesterol: 144 mg/dL (ref 0–200)
HDL: 51 mg/dL (ref >40)
LDL CALC: 69 mg/dL (ref 0–99)
Total CHOL/HDL Ratio: 2.8 RATIO
Triglycerides: 122 mg/dL (ref <150)
VLDL: 24 mg/dL (ref 0–40)

## 2016-10-15 LAB — LIPASE, BLOOD: Lipase: 27 U/L (ref 11–51)

## 2016-10-15 LAB — TROPONIN I: Troponin I: <0.03 ng/mL (ref <0.03)

## 2016-10-15 MED ORDER — DOCUSATE SODIUM 100 MG PO CAPS: 100.0000 mg | ORAL_CAPSULE | Freq: Two times a day (BID) | ORAL | Status: DC | PRN

## 2016-10-15 MED ORDER — METOPROLOL SUCCINATE ER 50 MG PO TB24
50.0000 mg | ORAL_TABLET | Freq: Every day | ORAL | Status: DC
  Administered 2016-10-16 – 2016-10-17 (×2): 50 mg via ORAL
  Filled 2016-10-15 (×2): qty 1

## 2016-10-15 MED ORDER — ENALAPRIL MALEATE 10 MG PO TABS
20.0000 mg | ORAL_TABLET | Freq: Two times a day (BID) | ORAL | Status: DC
  Administered 2016-10-15 – 2016-10-17 (×4): 20 mg via ORAL
  Filled 2016-10-15 (×4): qty 2

## 2016-10-15 MED ORDER — HYDROMORPHONE HCL 1 MG/ML IJ SOLN
INTRAMUSCULAR | Status: AC
  Administered 2016-10-15: 1 mg via INTRAVENOUS
  Filled 2016-10-15: qty 1

## 2016-10-15 MED ORDER — SODIUM CHLORIDE 0.9 % IV BOLUS (SEPSIS)
1000.0000 mL | Freq: Once | INTRAVENOUS | Status: AC
  Administered 2016-10-15: 1000 mL via INTRAVENOUS

## 2016-10-15 MED ORDER — INSULIN ASPART PROT & ASPART (70-30 MIX) 100 UNIT/ML ~~LOC~~ SUSP
20.0000 [IU] | Freq: Two times a day (BID) | SUBCUTANEOUS | Status: DC
  Administered 2016-10-16 – 2016-10-17 (×3): 20 [IU] via SUBCUTANEOUS
  Filled 2016-10-15 (×3): qty 20

## 2016-10-15 MED ORDER — GABAPENTIN 100 MG PO CAPS
100.0000 mg | ORAL_CAPSULE | Freq: Three times a day (TID) | ORAL | Status: DC
  Administered 2016-10-15 – 2016-10-17 (×5): 100 mg via ORAL
  Filled 2016-10-15 (×5): qty 1

## 2016-10-15 MED ORDER — HYDROMORPHONE HCL 1 MG/ML IJ SOLN
1.0000 mg | INTRAMUSCULAR | Status: AC
  Administered 2016-10-15: 1 mg via INTRAVENOUS

## 2016-10-15 MED ORDER — METFORMIN HCL 500 MG PO TABS
1000.0000 mg | ORAL_TABLET | Freq: Two times a day (BID) | ORAL | Status: DC
  Administered 2016-10-16 – 2016-10-17 (×3): 1000 mg via ORAL
  Filled 2016-10-15 (×3): qty 2

## 2016-10-15 MED ORDER — ATORVASTATIN CALCIUM 20 MG PO TABS
20.0000 mg | ORAL_TABLET | Freq: Every day | ORAL | Status: DC
  Administered 2016-10-15 – 2016-10-16 (×2): 20 mg via ORAL
  Filled 2016-10-15 (×2): qty 1

## 2016-10-15 MED ORDER — HEPARIN SODIUM (PORCINE) 5000 UNIT/ML IJ SOLN
5000.0000 [IU] | Freq: Three times a day (TID) | INTRAMUSCULAR | Status: DC
  Administered 2016-10-15 – 2016-10-16 (×2): 5000 [IU] via SUBCUTANEOUS
  Filled 2016-10-15 (×2): qty 1

## 2016-10-15 MED ORDER — NITROGLYCERIN 0.4 MG SL SUBL: 0.4000 mg | SUBLINGUAL_TABLET | SUBLINGUAL | Status: DC | PRN

## 2016-10-15 MED ORDER — BACLOFEN 10 MG PO TABS
20.0000 mg | ORAL_TABLET | Freq: Two times a day (BID) | ORAL | Status: DC | PRN
  Filled 2016-10-15: qty 1

## 2016-10-15 MED ORDER — NITROGLYCERIN 0.4 MG SL SUBL
0.4000 mg | SUBLINGUAL_TABLET | SUBLINGUAL | Status: DC | PRN
  Administered 2016-10-15: 0.4 mg via SUBLINGUAL
  Filled 2016-10-15: qty 1

## 2016-10-15 MED ORDER — IOPAMIDOL (ISOVUE-370) INJECTION 76%
100.0000 mL | Freq: Once | INTRAVENOUS | Status: AC | PRN
  Administered 2016-10-15: 75 mL via INTRAVENOUS

## 2016-10-15 MED ORDER — HYDROCODONE-ACETAMINOPHEN 10-325 MG PO TABS
1.0000 | ORAL_TABLET | ORAL | Status: DC | PRN
Start: 2016-10-15 — End: 2016-10-17
  Administered 2016-10-16 – 2016-10-17 (×2): 2 via ORAL
  Filled 2016-10-15 (×2): qty 2

## 2016-10-15 MED ORDER — PANTOPRAZOLE SODIUM 40 MG PO TBEC
40.0000 mg | DELAYED_RELEASE_TABLET | Freq: Every day | ORAL | Status: DC
  Administered 2016-10-16 – 2016-10-17 (×2): 40 mg via ORAL
  Filled 2016-10-15 (×2): qty 1

## 2016-10-15 MED ORDER — ASPIRIN EC 81 MG PO TBEC
81.0000 mg | DELAYED_RELEASE_TABLET | Freq: Every day | ORAL | Status: DC
  Administered 2016-10-16 – 2016-10-17 (×2): 81 mg via ORAL
  Filled 2016-10-15 (×2): qty 1

## 2016-10-15 MED ORDER — OXYBUTYNIN CHLORIDE 5 MG PO TABS
5.0000 mg | ORAL_TABLET | Freq: Three times a day (TID) | ORAL | Status: DC
  Administered 2016-10-15 – 2016-10-17 (×5): 5 mg via ORAL
  Filled 2016-10-15 (×5): qty 1

## 2016-10-15 MED ORDER — CYCLOBENZAPRINE HCL 10 MG PO TABS: 10.0000 mg | ORAL_TABLET | Freq: Three times a day (TID) | ORAL | Status: DC | PRN

## 2016-10-15 MED ORDER — BARIUM SULFATE 2.1 % PO SUSP: 450.0000 mL | ORAL | Status: AC

## 2016-10-15 MED ORDER — ONDANSETRON HCL 4 MG/2ML IJ SOLN
4.0000 mg | Freq: Once | INTRAMUSCULAR | Status: AC
  Administered 2016-10-15: 4 mg via INTRAVENOUS
  Filled 2016-10-15: qty 2

## 2016-10-15 MED ORDER — INSULIN ASPART 100 UNIT/ML ~~LOC~~ SOLN
0.0000 [IU] | Freq: Three times a day (TID) | SUBCUTANEOUS | Status: DC
  Administered 2016-10-16: 1 [IU] via SUBCUTANEOUS
  Administered 2016-10-16 – 2016-10-17 (×3): 2 [IU] via SUBCUTANEOUS
  Filled 2016-10-15: qty 1
  Filled 2016-10-15 (×3): qty 2

## 2016-10-15 NOTE — H&P (Signed)
[facility] NAME: Susan House    MR#:  [id number]  DATE OF BIRTH:  10-Mar-1956  DATE OF ADMISSION:  10/15/2016  PRIMARY CARE PHYSICIAN: Wilhemena Durie, MD   REQUESTING/REFERRING PHYSICIAN: [email or web address]  CHIEF COMPLAINT:   Chief Complaint  Patient presents with  . Abdominal Pain    HISTORY OF PRESENT ILLNESS: Susan House  is a 61 y.o. female with a known history of Depression, diabetes, gastric surgery for disease, hematuria, hyperlipidemia, hypertension, irritable bowel syndrome and ulcerative colitis, stroke or, TIA, recurrent urinary tract infection and urinary urgency- had a stroke workup done in December 2017 with no acute stroke was found. In the past she was on aspirin and Plavix but she had ulcerative colitis and some bleeding issues so Plavix was stopped since then she is only taking aspirin. She also had recurrent urinary tract infection in last 1-2 months. Today she complained of some abdominal pain but then she was completely confused and does not remember any events happened yesterday when they had been family gathering., Concerned with this family brought her to emergency room, and as per ER physician initially she complained of abdominal pain and chest pain and so he did the CT scan to rule out any abnormalities which was reported to be negative.. Later patient was again confused and she forgot about all these complaints while she came to the emergency room she does not even remember why she came to emergency room and family has to remind her about her complaints. Because of this forgetfulness CT scan of the head was also done which did not show any acute stroke but because her history of having stroke in the past she is at high risk of having another stroke and so ER physician suggested to keep her in hospital for observation and workup for stroke.  PAST MEDICAL HISTORY:   Past Medical History:  Diagnosis Date  .  Allergy   . Arthritis   . Cellulitis and abscess of face   . Depression   . Diabetes mellitus ([place])   . Edema   . GERD (gastroesophageal reflux disease)   . Hematuria   . History of echocardiogram    2008: normal, 2011: normal LVSF  . History of nuclear stress test    a. 12/2009: lexiscan - negative  . Hyperlipidemia   . Hypertension   . IBS (irritable bowel syndrome)   . Migraine   . Morbid obesity ([place])   . Nocturia   . Stroke ([place]) 2000  . TIA (transient ischemic attack) 2010  . Urgency of micturation   . Urinary frequency   . Urinary incontinence     PAST SURGICAL HISTORY: Past Surgical History:  Procedure Laterality Date  . ABDOMINAL HYSTERECTOMY    . ABDOMINOPLASTY    . APPENDECTOMY    . BACK SURGERY     extensive spinal fusion  . carpel tunnel surgery    . CATARACT EXTRACTION     x 2  . CESAREAN SECTION    . CHOLECYSTECTOMY    . COLONOSCOPY  2010  . COLONOSCOPY WITH PROPOFOL N/A 07/11/2016   Procedure: COLONOSCOPY WITH PROPOFOL;  Surgeon: Lucilla Lame, MD;  Location: [facility] ENDOSCOPY;  Service: Endoscopy;  Laterality: N/A;  . EYE SURGERY    . HERNIA REPAIR    . SHOULDER SURGERY    . TOE SURGERY    . UPPER GI ENDOSCOPY    . WRIST FRACTURE SURGERY Left  SOCIAL HISTORY:  Social History  Substance Use Topics  . Smoking status: Never Smoker  . Smokeless tobacco: Never Used  . Alcohol use No    FAMILY HISTORY:  Family History  Problem Relation Age of Onset  . Hyperlipidemia Mother   . Arrhythmia Mother     WPW  . Hypertension Mother   . Rheum arthritis Mother   . Heart disease Mother   . Heart attack Father 48  . Hyperlipidemia Father   . Hypertension Father   . Stroke Father   . Diabetes Father   . Heart disease Father   . Coronary artery disease Father   . Diabetes Sister   . Hyperlipidemia Sister   . Hypertension Sister   . Depression Sister   . Depression Sister   . Diabetes Sister   . Hypertension Sister   . Hyperlipidemia Sister    . Kidney disease Paternal Grandmother     DRUG ALLERGIES:  Allergies  Allergen Reactions  . Morphine And Related Anaphylaxis  . Fentanyl Other (See Comments)    Unknown reaction.  . Reglan [Metoclopramide]     Elevated BP  . Simvastatin Other (See Comments)    Muscle pain  . Betadine [Povidone Iodine] Rash  . Iodine Rash  . Tetracyclines & Related Rash    REVIEW OF SYSTEMS:   CONSTITUTIONAL: No fever, fatigue or weakness.  EYES: No blurred or double vision.  EARS, NOSE, AND THROAT: No tinnitus or ear pain.  RESPIRATORY: No cough, shortness of breath, wheezing or hemoptysis.  CARDIOVASCULAR: No chest pain, orthopnea, edema.  GASTROINTESTINAL: No nausea, vomiting, diarrhea or abdominal pain.  GENITOURINARY: No dysuria, hematuria.  ENDOCRINE: No polyuria, nocturia,  HEMATOLOGY: No anemia, easy bruising or bleeding SKIN: No rash or lesion. MUSCULOSKELETAL: No joint pain or arthritis.   NEUROLOGIC: No tingling, numbness, weakness.  PSYCHIATRY: No anxiety or depression.   MEDICATIONS AT HOME:  Prior to Admission medications   Medication Sig Start Date End Date Taking? Authorizing Provider  ACCU-CHEK AVIVA PLUS test strip TEST BLOOD SUGAR THREE TIMES DAILY 04/24/16   Jerrol Banana., MD  ACCU-CHEK SOFTCLIX LANCETS lancets CHECK BLOOD SUGAR THREE TIMES DAILY 04/24/16   Jerrol Banana., MD  amLODipine (NORVASC) 10 MG tablet Take 1 tablet (10 mg total) by mouth daily. 06/12/16   Jerrol Banana., MD  aspirin EC 81 MG tablet Take 81 mg by mouth daily.    Historical Provider, MD  atorvastatin (LIPITOR) 40 MG tablet Take 0.5 tablets (20 mg total) by mouth at bedtime. 07/28/16   Minna Merritts, MD  Blood Glucose Monitoring Suppl (ACCU-CHEK AVIVA PLUS) w/Device KIT Check sugar three times daily DX E11.9-needs a meter 11/23/15   Richard Maceo Pro., MD  ciprofloxacin (CIPRO) 500 MG tablet Take 1 tablet (500 mg total) by mouth 2 (two) times daily. 10/10/16   Jerrol Banana., MD  Cranberry 1000 MG CAPS Take 2,000 mg by mouth daily.     Historical Provider, MD  cyclobenzaprine (FLEXERIL) 10 MG tablet Take 1 tablet (10 mg total) by mouth 3 (three) times daily as needed for muscle spasms. 07/06/16   Trinna Post, PA-C  diphenoxylate-atropine (LOMOTIL) 2.5-0.025 MG tablet 2 tablets initially and then 1 tablet every 6 hours as needed for diarrhea 07/25/16   Jerrol Banana., MD  Doxepin HCl (ZONALON) 5 % CREA Apply 1 application topically 4 (four) times daily. 09/19/16   Carmon Ginsberg, PA  enalapril (VASOTEC) 20  MG tablet Take 1 tablet (20 mg total) by mouth 2 (two) times daily. 09/15/16   Mar Daring, PA-C  gabapentin (NEURONTIN) 100 MG capsule Take 1 capsule (100 mg total) by mouth 3 (three) times daily. 06/12/16   Richard Maceo Pro., MD  HYDROcodone-acetaminophen [facility]) 10-325 MG tablet Take 1-2 tablets by mouth every 4 (four) hours as needed. 09/18/16   Richard Maceo Pro., MD  insulin aspart protamine- aspart (NOVOLOG MIX 70/30) (70-30) 100 UNIT/ML injection Inject 25-30 Units into the skin 2 (two) times daily.     Historical Provider, MD  Lancet Devices [facility]) lancets Check sugar three times daily DX E11.9 11/23/15   Richard Maceo Pro., MD  lidocaine (LIDODERM) 5 % Place 1 patch onto the skin daily. Remove after 12 hours 06/29/16   Richard Maceo Pro., MD  magnesium oxide (MAG-OX) 400 (241.3 Mg) MG tablet Take 1 tablet (400 mg total) by mouth daily. 06/12/16   Richard Maceo Pro., MD  metFORMIN (GLUCOPHAGE) 1000 MG tablet Take 1 tablet (1,000 mg total) by mouth 2 (two) times daily. 06/12/16   Richard Maceo Pro., MD  metoprolol succinate (TOPROL-XL) 50 MG 24 hr tablet Take 1 tablet (50 mg total) by mouth daily. Take with or immediately following a meal. 07/28/16 07/28/17  Minna Merritts, MD  mometasone (ELOCON) 0.1 % cream Apply once daily for 1 week at a time. 04/06/16   Richard Maceo Pro., MD  nitroGLYCERIN (NITROSTAT)  0.4 MG SL tablet Place 1 tablet (0.4 mg total) under the tongue every 5 (five) minutes as needed for chest pain. 10/11/15   Richard Maceo Pro., MD  oxybutynin (DITROPAN) 5 MG tablet Take 1 tablet (5 mg total) by mouth 3 (three) times daily. 06/12/16   Richard Maceo Pro., MD  oxyCODONE (OXY IR/ROXICODONE) 5 MG immediate release tablet Take 1 tablet (5 mg total) by mouth every 4 (four) hours as needed for severe pain. 07/26/16   Richard Maceo Pro., MD  pantoprazole (PROTONIX) 40 MG tablet Take 1 tablet (40 mg total) by mouth daily. 06/12/16   Richard Maceo Pro., MD  promethazine (PHENERGAN) 25 MG tablet Take 1 tablet (25 mg total) by mouth every 8 (eight) hours as needed for nausea or vomiting. Patient not taking: Reported on 10/10/2016 07/25/16   Jerrol Banana., MD  sulfacetamide (BLEPH-10) 10 % ophthalmic solution Place 2 drops into the left eye 4 (four) times daily. Patient not taking: Reported on 10/15/2016 09/07/16   Carmon Ginsberg, PA      PHYSICAL EXAMINATION:   VITAL SIGNS: Blood pressure (!) 165/71, pulse 72, temperature 97.9 F (36.6 C), temperature source Oral, resp. rate 16, height 5' 2" (1.575 m), weight 113.4 kg (250 lb), SpO2 99 %.  GENERAL:  61 y.o.-year-old obese patient lying in the bed with no acute distress.  EYES: Pupils equal, round, reactive to light and accommodation. No scleral icterus. Extraocular muscles intact.  HEENT: Head atraumatic, normocephalic. Oropharynx and nasopharynx clear.  NECK:  Supple, no jugular venous distention. No thyroid enlargement, no tenderness.  LUNGS: Normal breath sounds bilaterally, no wheezing, rales,rhonchi or crepitation. No use of accessory muscles of respiration.  CARDIOVASCULAR: S1, S2 normal. No murmurs, rubs, or gallops.  ABDOMEN: Soft, nontender, nondistended. Bowel sounds present. No organomegaly or mass.  EXTREMITIES: No pedal edema, cyanosis, or clubbing.  NEUROLOGIC: Cranial nerves II through XII are intact. Muscle  strength 5/5 in all extremities except left upper limb-  2/5 and atrophic changes.. Sensation intact. Gait not checked.  PSYCHIATRIC: The patient is alert and oriented x 3.  SKIN: No obvious rash, lesion, or ulcer.   LABORATORY PANEL:   CBC  Recent Labs Lab 10/15/16 1552  WBC 9.7  HGB 13.4  HCT 40.5  PLT 298  MCV 84.3  MCH 27.8  MCHC 33.0  RDW 17.5*  LYMPHSABS 1.9  MONOABS 0.8  EOSABS 0.2  BASOSABS 0.0   ------------------------------------------------------------------------------------------------------------------  Chemistries   Recent Labs Lab 10/15/16 1552  NA 138  K 3.7  CL 105  CO2 22  GLUCOSE 240*  BUN 17  CREATININE 0.83  CALCIUM 9.3   ------------------------------------------------------------------------------------------------------------------ estimated creatinine clearance is 85.8 mL/min (by C-G formula based on SCr of 0.83 mg/dL). ------------------------------------------------------------------------------------------------------------------ No results for input(s): TSH, T4TOTAL, T3FREE, THYROIDAB in the last 72 hours.  Invalid input(s): FREET3   Coagulation profile No results for input(s): INR, PROTIME in the last 168 hours. ------------------------------------------------------------------------------------------------------------------- No results for input(s): DDIMER in the last 72 hours. -------------------------------------------------------------------------------------------------------------------  Cardiac Enzymes  Recent Labs Lab 10/15/16 1552  TROPONINI <0.03   ------------------------------------------------------------------------------------------------------------------ Invalid input(s): POCBNP  ---------------------------------------------------------------------------------------------------------------  Urinalysis    Component Value Date/Time   COLORURINE YELLOW (A) 10/15/2016 1709   APPEARANCEUR HAZY (A)  10/15/2016 1709   APPEARANCEUR Turbid (A) 05/25/2016 1122   LABSPEC >1.046 (H) 10/15/2016 1709   LABSPEC 1.014 01/17/2014 1708   PHURINE 5.0 10/15/2016 1709   GLUCOSEU 50 (A) 10/15/2016 1709   GLUCOSEU >=500 01/17/2014 1708   HGBUR SMALL (A) 10/15/2016 1709   BILIRUBINUR NEGATIVE 10/15/2016 1709   BILIRUBINUR negative 10/10/2016 1421   BILIRUBINUR Negative 05/25/2016 1122   BILIRUBINUR Negative 01/17/2014 1708   KETONESUR NEGATIVE 10/15/2016 1709   PROTEINUR 100 (A) 10/15/2016 1709   UROBILINOGEN 0.2 10/10/2016 1421   UROBILINOGEN 0.2 01/15/2009 0431   NITRITE NEGATIVE 10/15/2016 1709   LEUKOCYTESUR NEGATIVE 10/15/2016 1709   LEUKOCYTESUR Trace (A) 05/25/2016 1122   LEUKOCYTESUR 2+ 01/17/2014 1708     RADIOLOGY: Ct Head Wo Contrast  Result Date: 10/15/2016 CLINICAL DATA:  Altered mental status. EXAM: CT HEAD WITHOUT CONTRAST TECHNIQUE: Contiguous axial images were obtained from the base of the skull through the vertex without intravenous contrast. COMPARISON:  Brain MRI 07/08/2016 FINDINGS: Brain: There is no evidence of acute cortical infarct, intracranial hemorrhage, mass, midline shift, or extra-axial fluid collection. A chronic infarct is again seen involving the left basal ganglia and corona radiata with dilatation of the left lateral ventricle and associated wallerian degeneration in the brainstem. Additional mild scattered cerebral white matter hypodensities elsewhere are nonspecific but compatible with chronic small vessel ischemic disease. Vascular: Calcified atherosclerosis at the skullbase. No hyperdense vessel. Skull: No fracture or focal osseous lesion. Sinuses/Orbits: Paranasal sinuses and mastoid air cells are clear. Bilateral cataract extraction. Other: None. IMPRESSION: 1. No evidence of acute intracranial abnormality. 2. Chronic left basal ganglia infarct. Electronically Signed   By: Logan Bores M.D.   On: 10/15/2016 16:41   Dg Chest Portable 1 View  Result Date:  10/15/2016 CLINICAL DATA:  Abdominal pain chest pain since this morning, history diabetes mellitus, hypertension, TIA, GERD, stroke EXAM: PORTABLE CHEST 1 VIEW COMPARISON:  Portable exam 1551 hours compared 12/26/2015 FINDINGS: Upper normal heart size. Aortic atherosclerosis. Slight pulmonary vascular congestion. Mitral annular calcification. Bronchitic changes without definite infiltrate, pleural effusion or pneumothorax. Bones demineralized with note of RIGHT clavicular deformity from old non healed fracture. IMPRESSION: Bronchitic changes without infiltrate. Electronically Signed   By: Crist Infante.D.  On: 10/15/2016 16:15   Ct Angio Chest/abd/pel For Dissection W And/or Wo Contrast  Result Date: 10/15/2016 CLINICAL DATA:  Abdominal pain, chest pain, shortness of breath tenderness with palpation in abdomen, history hypertension, GERD, diabetes mellitus, stroke EXAM: CT ANGIOGRAPHY CHEST, ABDOMEN AND PELVIS TECHNIQUE: Multidetector CT imaging through the chest, abdomen and pelvis was performed using the standard protocol during bolus administration of intravenous contrast. Multiplanar reconstructed images and MIPs were obtained and reviewed to evaluate the vascular anatomy. Patient had a history of allergy to iodine. No definite history of contrast allergy. Patient received contrast ventral without reaction. CONTRAST:  75 cc Isovue 370 IV COMPARISON:  CT abdomen pelvis 12/23/2015, CT chest 07/28/2005 FINDINGS: CTA CHEST FINDINGS Cardiovascular: Atherosclerotic calcifications aorta, coronary arteries, and proximal great vessels. No evidence of intramural hematoma on precontrast imaging. Ascending thoracic aorta upper normal caliber 3.5 cm diameter. Mitral annular calcification noted. Normal aortic enhancement without dissection. Pulmonary arteries grossly patent on nondedicated exam. No pericardial effusion. Mediastinum/Nodes: Base of cervical region unremarkable. Esophagus normal appearance. No thoracic  adenopathy. Lungs/Pleura: Minimal bibasilar subsegmental atelectasis. Lungs otherwise clear. No pulmonary infiltrate, pleural effusion or pneumothorax. Musculoskeletal: Diffuse osseous demineralization. LEFT glenohumeral degenerative changes. No acute osseous findings. Review of the MIP images confirms the above findings. CTA ABDOMEN AND PELVIS FINDINGS VASCULAR Aorta: Atherosclerotic calcification without aneurysm Celiac: Significant calcified plaque at origin. Greater than 50% stenosis suspected. SMA: Significant atherosclerotic calcified plaque at origin with greater than 50% stenosis Renals: Significant calcified atherosclerotic plaque at the origins of both renal arteries with suspected high-grade stenoses bilaterally IMA: Patent though calcified plaque is seen at the origin Inflow: Atherosclerotic calcifications throughout the common and internal iliac arteries, less in external iliac and common femoral arteries. Veins: Portal vein and splenic vein appear grossly patent. Review of the MIP images confirms the above findings. NON-VASCULAR Hepatobiliary: Gallbladder surgically absent. Liver normal appearance Pancreas: Normal appearance Spleen: Normal appearance Adrenals/Urinary Tract: Adrenal glands, kidneys, ureters, and bladder normal appearance Stomach/Bowel: Appendix not visualized. Stomach and bowel loops grossly unremarkable for technique Lymphatic: No adenopathy Reproductive: Uterus surgically absent with nonvisualization of ovaries Other: No free air free fluid.  No mass or hernia. Musculoskeletal: Demineralized. Prior L4-L5 fusion. Degenerative disc disease changes lower thoracic and lumbar spine. Review of the MIP images confirms the above findings. IMPRESSION: Extensive atherosclerotic calcifications including aorta, coronary arteries and the origins of multiple abdominal branch vessels as above. No evidence of aortic aneurysm or dissection. Minimal bibasilar atelectasis. No acute intrathoracic,  intra-abdominal or intrapelvic abnormalities. Electronically Signed   By: Lavonia Dana M.D.   On: 10/15/2016 16:49    EKG: Orders placed or performed during the hospital encounter of 10/15/16  . ED EKG  . ED EKG  . EKG 12-Lead  . EKG 12-Lead  . EKG 12-Lead  . EKG 12-Lead    IMPRESSION AND PLAN:  * TIA  Confusion    She is echocardiogram and carotid Doppler study done 3 months ago, I will not repeat at this time.   I will do MRI of the brain and get neurology consult.   Get physical therapy evaluation check her lipid panel and hemoglobin A1c.   Continue home medications for now and neurologist to decide to change any medication.  * Hypertension   Continue medications for now.  * Diabetes   Continue medications and keep on insulin sliding scale coverage.  * Hyperlipidemia   Check lipid panel.  * Recurrent UTI   Currently UA is negative.  All the  records are reviewed and case discussed with ED provider. Management plans discussed with the patient, family and they are in agreement.  CODE STATUS: Full code Code Status History    Date Active Date Inactive Code Status Order ID Comments User Context   07/08/2016  4:37 AM 07/11/2016  4:10 PM Full Code [id number]  Saundra Shelling, MD ED   12/26/2015  6:47 PM 12/28/2015  8:27 PM Full Code [id number]  Idelle Crouch, MD Inpatient   07/22/2015  7:35 AM 07/23/2015  3:05 PM Full Code [id number]  Saundra Shelling, MD Inpatient    Advance Directive Documentation     Most Recent Value  Type of Advance Directive  Living will  Pre-existing out of facility DNR order (yellow form or pink MOST form)  -  "MOST" Form in Place?  -     Patient's husband, daughter, sister were present in the room during my visit.  TOTAL TIME TAKING CARE OF THIS PATIENT: 50 minutes.    Vaughan Basta M.D on 10/15/2016   Between 7am to 6pm - Pager - [phone]  After 6pm go to [email or web address] - password [email or web address]  Office   [phone]  CC: Primary care physician; Wilhemena Durie, MD   Note: This dictation was prepared with Dragon dictation along with smaller phrase technology. Any transcriptional errors that result from this process are unintentional.

## 2016-10-15 NOTE — ED Notes (Signed)
Pt laughing with family, denies pain at this time, states feeling much better, cont to monitor

## 2016-10-15 NOTE — ED Notes (Signed)
Patient alert and oriented, speaking in complete sentences and reports pain has resolved.

## 2016-10-15 NOTE — ED Notes (Signed)
Bladder scan 25ML total. Informed dr. Kerman Passey. Pts brief full of urine. Primary RN notified.

## 2016-10-15 NOTE — ED Triage Notes (Signed)
Pt to ED from home via [facility] c/o abdominal pain. Per EMS pt reports abdominal pain but does not answer any other questions besides moaning and grimaciog in pain. Upon arrival pt continues to moan in pain, and upon palpation reports generalized tenderness.

## 2016-10-15 NOTE — Progress Notes (Signed)
Family Meeting Note  Advance Directive:yes  Today a meeting took place with the Patient, spouse and daughter.  The following clinical team members were present during this meeting:MD  The following were discussed:Patient's diagnosis: TIA, DM, HTn , Patient's progosis: Unable to determine and Goals for treatment: Full Code  Additional follow-up to be provided: PMD., Neurology consult.  Time spent during discussion:20 minutes  Susan House, [name], MD
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
## 2016-10-16 ENCOUNTER — Observation Stay: Payer: PPO

## 2016-10-16 DIAGNOSIS — G459 Transient cerebral ischemic attack, unspecified: Secondary | ICD-10-CM | POA: Diagnosis not present

## 2016-10-16 DIAGNOSIS — I69359 Hemiplegia and hemiparesis following cerebral infarction affecting unspecified side: Secondary | ICD-10-CM | POA: Diagnosis not present

## 2016-10-16 DIAGNOSIS — E118 Type 2 diabetes mellitus with unspecified complications: Secondary | ICD-10-CM | POA: Diagnosis not present

## 2016-10-16 DIAGNOSIS — I10 Essential (primary) hypertension: Secondary | ICD-10-CM | POA: Diagnosis not present

## 2016-10-16 DIAGNOSIS — R569 Unspecified convulsions: Secondary | ICD-10-CM | POA: Diagnosis not present

## 2016-10-16 DIAGNOSIS — G9341 Metabolic encephalopathy: Secondary | ICD-10-CM | POA: Diagnosis not present

## 2016-10-16 DIAGNOSIS — I639 Cerebral infarction, unspecified: Secondary | ICD-10-CM | POA: Diagnosis not present

## 2016-10-16 LAB — CBC
HCT: 36.4 % (ref 35.0–47.0)
Hemoglobin: 12.1 g/dL (ref 12.0–16.0)
MCH: 28.5 pg (ref 26.0–34.0)
MCHC: 33.3 g/dL (ref 32.0–36.0)
MCV: 85.3 fL (ref 80.0–100.0)
Platelets: 262 10*3/uL (ref 150–440)
RBC: 4.27 MIL/uL (ref 3.80–5.20)
RDW: 17.5 % — AB (ref 11.5–14.5)
WBC: 6.8 10*3/uL (ref 3.6–11.0)

## 2016-10-16 LAB — GLUCOSE, CAPILLARY
GLUCOSE-CAPILLARY: 68 mg/dL (ref 65–99)
GLUCOSE-CAPILLARY: 152 mg/dL — AB (ref 65–99)
Glucose-Capillary: 142 mg/dL — ABNORMAL HIGH (ref 65–99)
Glucose-Capillary: 155 mg/dL — ABNORMAL HIGH (ref 65–99)
Glucose-Capillary: 143 mg/dL — ABNORMAL HIGH (ref 65–99)

## 2016-10-16 LAB — BASIC METABOLIC PANEL
Anion gap: 8 (ref 5–15)
BUN: 15 mg/dL (ref 6–20)
CALCIUM: 9.1 mg/dL (ref 8.9–10.3)
CO2: 27 mmol/L (ref 22–32)
CREATININE: 0.82 mg/dL (ref 0.44–1.00)
Chloride: 107 mmol/L (ref 101–111)
GFR calc Af Amer: >60 mL/min (ref >60)
GLUCOSE: 119 mg/dL — AB (ref 65–99)
Potassium: 3.2 mmol/L — ABNORMAL LOW (ref 3.5–5.1)
Sodium: 142 mmol/L (ref 135–145)

## 2016-10-16 MED ORDER — LEVETIRACETAM 500 MG PO TABS
500.0000 mg | ORAL_TABLET | Freq: Two times a day (BID) | ORAL | Status: DC
  Administered 2016-10-16 – 2016-10-17 (×3): 500 mg via ORAL
  Filled 2016-10-16 (×3): qty 1

## 2016-10-16 MED ORDER — ENOXAPARIN SODIUM 40 MG/0.4ML ~~LOC~~ SOLN
40.0000 mg | Freq: Two times a day (BID) | SUBCUTANEOUS | Status: DC
  Administered 2016-10-16 – 2016-10-17 (×2): 40 mg via SUBCUTANEOUS
  Filled 2016-10-16 (×2): qty 0.4

## 2016-10-16 MED ORDER — LEVETIRACETAM 500 MG PO TABS: 500.0000 mg | ORAL_TABLET | Freq: Two times a day (BID) | ORAL | 2 refills | Status: DC

## 2016-10-16 MED ORDER — ENOXAPARIN SODIUM 40 MG/0.4ML ~~LOC~~ SOLN
40.0000 mg | SUBCUTANEOUS | Status: DC
  Administered 2016-10-16: 10:00:00 40 mg via SUBCUTANEOUS
  Filled 2016-10-16: qty 0.4

## 2016-10-16 MED ORDER — POTASSIUM CHLORIDE CRYS ER 20 MEQ PO TBCR
40.0000 meq | EXTENDED_RELEASE_TABLET | Freq: Once | ORAL | Status: AC
  Administered 2016-10-16: 10:00:00 40 meq via ORAL
  Filled 2016-10-16: qty 2

## 2016-10-16 NOTE — Care Management Obs Status (Signed)
[place] NOTIFICATION   Patient Details  Name: Susan House MRN: [id number] Date of Birth: Oct 28, 1955   Medicare Observation Status Notification Given:  Yes    Shelbie Ammons, RN 10/16/2016, 10:18 AM

## 2016-10-16 NOTE — Consult Note (Addendum)
Reason for Consult:AMS Referring Physician: Tressia Miners  CC: Confusion  HPI: Susan House is an 61 y.o. female with a history of confusion with dehydration who presents with confusion.  Patient at baseline spends most of her time at home.  Can ambulate in the house with a cane but requires a wheelchair when outside of the house.  Incontinent.  Does not cook or clean.  Has difficulty getting her words out at times.  This has been the case since the development of severe depression about 20 years ago that was then compounded by her stroke.   She was with her husband on last evening when she started to complain of pain all over.  Became confused and unable to follow commands.  Was brought to the ED for admission.  Has improved this morning but husband does not feel she is back to baseline.  Recently treated for UTI.  Past Medical History:  Diagnosis Date  . Allergy   . Arthritis   . Cellulitis and abscess of face   . Depression   . Diabetes mellitus ([place])   . Edema   . GERD (gastroesophageal reflux disease)   . Hematuria   . History of echocardiogram    2008: normal, 2011: normal LVSF  . History of nuclear stress test    a. 12/2009: lexiscan - negative  . Hyperlipidemia   . Hypertension   . IBS (irritable bowel syndrome)   . Migraine   . Morbid obesity ([place])   . Nocturia   . Stroke ([place]) 2000  . TIA (transient ischemic attack) 2010  . Urgency of micturation   . Urinary frequency   . Urinary incontinence     Past Surgical History:  Procedure Laterality Date  . ABDOMINAL HYSTERECTOMY    . ABDOMINOPLASTY    . APPENDECTOMY    . BACK SURGERY     extensive spinal fusion  . carpel tunnel surgery    . CATARACT EXTRACTION     x 2  . CESAREAN SECTION    . CHOLECYSTECTOMY    . COLONOSCOPY  2010  . COLONOSCOPY WITH PROPOFOL N/A 07/11/2016   Procedure: COLONOSCOPY WITH PROPOFOL;  Surgeon: Lucilla Lame, MD;  Location: [facility] ENDOSCOPY;  Service: Endoscopy;  Laterality: N/A;  . EYE  SURGERY    . HERNIA REPAIR    . SHOULDER SURGERY    . TOE SURGERY    . UPPER GI ENDOSCOPY    . WRIST FRACTURE SURGERY Left     Family History  Problem Relation Age of Onset  . Hyperlipidemia Mother   . Arrhythmia Mother     WPW  . Hypertension Mother   . Rheum arthritis Mother   . Heart disease Mother   . Heart attack Father 72  . Hyperlipidemia Father   . Hypertension Father   . Stroke Father   . Diabetes Father   . Heart disease Father   . Coronary artery disease Father   . Diabetes Sister   . Hyperlipidemia Sister   . Hypertension Sister   . Depression Sister   . Depression Sister   . Diabetes Sister   . Hypertension Sister   . Hyperlipidemia Sister   . Kidney disease Paternal Grandmother     Social History:  reports that she has never smoked. She has never used smokeless tobacco. She reports that she does not drink alcohol or use drugs.  Allergies  Allergen Reactions  . Morphine And Related Anaphylaxis  . Fentanyl Other (See Comments)  Unknown reaction.  . Reglan [Metoclopramide]     Elevated BP  . Simvastatin Other (See Comments)    Muscle pain  . Betadine [Povidone Iodine] Rash  . Iodine Rash  . Tetracyclines & Related Rash    Medications:  I have reviewed the patient's current medications. Prior to Admission:  Prescriptions Prior to Admission  Medication Sig Dispense Refill Last Dose  . amLODipine (NORVASC) 10 MG tablet Take 1 tablet (10 mg total) by mouth daily. 90 tablet 3 10/14/2016 at Unknown time  . aspirin EC 81 MG tablet Take 81 mg by mouth daily.   10/14/2016 at Unknown time  . atorvastatin (LIPITOR) 40 MG tablet Take 0.5 tablets (20 mg total) by mouth at bedtime. 90 tablet 3 10/14/2016 at Unknown time  . baclofen (LIORESAL) 20 MG tablet Take 1 tablet by mouth 2 (two) times daily as needed.    10/14/2016 at Unknown time  . Cranberry 1000 MG CAPS Take 2,000 mg by mouth daily.    10/14/2016 at Unknown time  . cyclobenzaprine (FLEXERIL) 10 MG  tablet Take 1 tablet (10 mg total) by mouth 3 (three) times daily as needed for muscle spasms. 30 tablet 0 prn at prn  . diphenoxylate-atropine (LOMOTIL) 2.5-0.025 MG tablet 2 tablets initially and then 1 tablet every 6 hours as needed for diarrhea 60 tablet 1 prn at prn  . Doxepin HCl (ZONALON) 5 % CREA Apply 1 application topically 4 (four) times daily. 45 g 2 10/14/2016 at Unknown time  . enalapril (VASOTEC) 20 MG tablet Take 1 tablet (20 mg total) by mouth 2 (two) times daily. 180 tablet 0 10/14/2016 at Unknown time  . gabapentin (NEURONTIN) 100 MG capsule Take 1 capsule (100 mg total) by mouth 3 (three) times daily. 270 capsule 3 10/14/2016 at Unknown time  . HYDROcodone-acetaminophen (NORCO) 10-325 MG tablet Take 1-2 tablets by mouth every 4 (four) hours as needed. 180 tablet 0 Past Week at prn  . insulin aspart protamine- aspart (NOVOLOG MIX 70/30) (70-30) 100 UNIT/ML injection Inject 25-30 Units into the skin 2 (two) times daily.    10/14/2016 at Unknown time  . magnesium oxide (MAG-OX) 400 (241.3 Mg) MG tablet Take 1 tablet (400 mg total) by mouth daily. 90 tablet 3 10/14/2016 at Unknown time  . metFORMIN (GLUCOPHAGE) 1000 MG tablet Take 1 tablet (1,000 mg total) by mouth 2 (two) times daily. 180 tablet 3 10/14/2016 at Unknown time  . metoprolol succinate (TOPROL-XL) 50 MG 24 hr tablet Take 1 tablet (50 mg total) by mouth daily. Take with or immediately following a meal. 30 tablet 11 10/14/2016 at Unknown time  . nitroGLYCERIN (NITROSTAT) 0.4 MG SL tablet Place 1 tablet (0.4 mg total) under the tongue every 5 (five) minutes as needed for chest pain. 25 tablet 0 prn at prn  . oxybutynin (DITROPAN) 5 MG tablet Take 1 tablet (5 mg total) by mouth 3 (three) times daily. 270 tablet 3 10/14/2016 at Unknown time  . pantoprazole (PROTONIX) 40 MG tablet Take 1 tablet (40 mg total) by mouth daily. 90 tablet 3 10/14/2016 at Unknown time  . ACCU-CHEK AVIVA PLUS test strip TEST BLOOD SUGAR THREE TIMES DAILY 300 each  3 Taking  . ACCU-CHEK SOFTCLIX LANCETS lancets CHECK BLOOD SUGAR THREE TIMES DAILY 300 each 3 Taking  . Blood Glucose Monitoring Suppl (ACCU-CHEK AVIVA PLUS) w/Device KIT Check sugar three times daily DX E11.9-needs a meter 1 kit 0 Taking  . ciprofloxacin (CIPRO) 500 MG tablet Take 1 tablet (500 mg  total) by mouth 2 (two) times daily. (Patient not taking: Reported on 10/15/2016) 10 tablet 4 Not Taking at prn  . Lancet Devices (ACCU-CHEK SOFTCLIX) lancets Check sugar three times daily DX E11.9 100 each 3 Taking  . lidocaine (LIDODERM) 5 % Place 1 patch onto the skin daily. Remove after 12 hours (Patient not taking: Reported on 10/15/2016) 30 patch 12 Not Taking at Unknown time  . mometasone (ELOCON) 0.1 % cream Apply once daily for 1 week at a time. (Patient not taking: Reported on 10/15/2016) 50 g 2 Not Taking at Unknown time  . oxyCODONE (OXY IR/ROXICODONE) 5 MG immediate release tablet Take 1 tablet (5 mg total) by mouth every 4 (four) hours as needed for severe pain. (Patient not taking: Reported on 10/15/2016) 100 tablet 0 Not Taking at Unknown time  . promethazine (PHENERGAN) 25 MG tablet Take 1 tablet (25 mg total) by mouth every 8 (eight) hours as needed for nausea or vomiting. (Patient not taking: Reported on 10/10/2016) 55 tablet 1 Not Taking at Unknown time  . sulfacetamide (BLEPH-10) 10 % ophthalmic solution Place 2 drops into the left eye 4 (four) times daily. (Patient not taking: Reported on 10/15/2016) 15 mL 0 Not Taking at Unknown time   Scheduled: . aspirin EC  81 mg Oral Daily  . atorvastatin  20 mg Oral QHS  . enalapril  20 mg Oral BID  . enoxaparin (LOVENOX) injection  40 mg Subcutaneous Q24H  . gabapentin  100 mg Oral TID  . insulin aspart  0-9 Units Subcutaneous TID WC  . insulin aspart protamine- aspart  20 Units Subcutaneous BID  . metFORMIN  1,000 mg Oral BID WC  . metoprolol succinate  50 mg Oral Daily  . oxybutynin  5 mg Oral TID  . pantoprazole  40 mg Oral Daily     ROS: History obtained from the patient  General ROS: negative for - chills, fatigue, fever, night sweats, weight gain or weight loss Psychological ROS: as noted in HPI Ophthalmic ROS: negative for - blurry vision, double vision, eye pain or loss of vision ENT ROS: negative for - epistaxis, nasal discharge, oral lesions, sore throat, tinnitus or vertigo Allergy and Immunology ROS: negative for - hives or itchy/watery eyes Hematological and Lymphatic ROS: negative for - bleeding problems, bruising or swollen lymph nodes Endocrine ROS: negative for - galactorrhea, hair pattern changes, polydipsia/polyuria or temperature intolerance Respiratory ROS: negative for - cough, hemoptysis, shortness of breath or wheezing Cardiovascular ROS: negative for - chest pain, dyspnea on exertion, edema or irregular heartbeat Gastrointestinal ROS: abdominal pain Genito-Urinary ROS: as noted in HPI Musculoskeletal ROS: pain in extremities Neurological ROS: as noted in HPI Dermatological ROS: negative for rash and skin lesion changes  Physical Examination: Blood pressure (!) 142/62, pulse 75, temperature 97.7 F (36.5 C), temperature source Oral, resp. rate 20, height 5' 1"  (1.549 m), weight 116.7 kg (257 lb 3.2 oz), SpO2 98 %.  HEENT-  Normocephalic, no lesions, without obvious abnormality.  Normal external eye and conjunctiva.  Normal TM's bilaterally.  Normal auditory canals and external ears. Normal external nose, mucus membranes and septum.  Normal pharynx. Cardiovascular- S1, S2 normal, pulses palpable throughout   Lungs- chest clear, no wheezing, rales, normal symmetric air entry Abdomen- soft, non-tender; bowel sounds normal; no masses,  no organomegaly Extremities- contracture of LUE Lymph-no adenopathy palpable Musculoskeletal-no joint tenderness, deformity or swelling Skin-warm and dry, no hyperpigmentation, vitiligo, or suspicious lesions  Neurological Examination   Mental Status: Alert,  oriented, thought content appropriate.  Word finding difficulties at times.  Able to follow 3 step commands without difficulty. Cranial Nerves: II: Discs flat bilaterally; LHH, pupils equal, round, reactive to light and accommodation III,IV, VI: ptosis not present, extra-ocular motions intact bilaterally V,VII: smile symmetric, facial light touch sensation normal bilaterally VIII: hearing normal bilaterally IX,X: gag reflex present XI: bilateral shoulder shrug XII: midline tongue extension Motor: Right : Upper extremity   5/5    Left:     Upper extremity   3+/5  Lower extremity   5/5     Lower extremity   3/5 Tone and bulk:normal tone throughout; no atrophy noted Sensory: Pinprick and light touch decreased in the LLE Deep Tendon Reflexes: 3+ on the left and 2+ on the right. Plantars: Right: mute   Left: upgoing Cerebellar: Normal finger-to-nose testing on the right, normal heel-to-shin testing bilaterally Gait: not tested due to safety concerns    Laboratory Studies:   Basic Metabolic Panel:  Recent Labs Lab 10/15/16 1552 10/16/16 0412  NA 138 142  K 3.7 3.2*  CL 105 107  CO2 22 27  GLUCOSE 240* 119*  BUN 17 15  CREATININE 0.83 0.82  CALCIUM 9.3 9.1    Liver Function Tests: No results for input(s): AST, ALT, ALKPHOS, BILITOT, PROT, ALBUMIN in the last 168 hours.  Recent Labs Lab 10/15/16 1552  LIPASE 27   No results for input(s): AMMONIA in the last 168 hours.  CBC:  Recent Labs Lab 10/15/16 1552 10/16/16 0412  WBC 9.7 6.8  NEUTROABS 6.7*  --   HGB 13.4 12.1  HCT 40.5 36.4  MCV 84.3 85.3  PLT 298 262    Cardiac Enzymes:  Recent Labs Lab 10/15/16 1552  TROPONINI <0.03    BNP: Invalid input(s): POCBNP  CBG:  Recent Labs Lab 10/15/16 2237 10/16/16 0751  GLUCAP 237* 64    Microbiology: Results for orders placed or performed in visit on 10/10/16  Urine Culture     Status: Abnormal   Collection Time: 10/10/16  2:36 PM  Result Value Ref  Range Status   Urine Culture, Routine Final report (A)  Final   Urine Culture result 1 Escherichia coli (A)  Final    Comment: Greater than 100,000 colony forming units per mL Cefazolin <=4 ug/mL Cefazolin with an MIC <=16 predicts susceptibility to the oral agents cefaclor, cefdinir, cefpodoxime, cefprozil, cefuroxime, cephalexin, and loracarbef when used for therapy of uncomplicated urinary tract infections due to E. coli, Klebsiella pneumoniae, and Proteus mirabilis.    ANTIMICROBIAL SUSCEPTIBILITY Comment  Final    Comment:       ** S = Susceptible; I = Intermediate; R = Resistant **                    P = Positive; N = Negative             MICS are expressed in micrograms per mL    Antibiotic                 RSLT#1    RSLT#2    RSLT#3    RSLT#4 Amoxicillin/Clavulanic Acid    S Ampicillin                     S Cefepime                       S Ceftriaxone  S Cefuroxime                     S Cephalothin                    S Ciprofloxacin                  S Ertapenem                      S Gentamicin                     S Imipenem                       S Levofloxacin                   S Nitrofurantoin                 S Piperacillin                   S Tetracycline                   S Tobramycin                     S Trimethoprim/Sulfa             R     Coagulation Studies: No results for input(s): LABPROT, INR in the last 72 hours.  Urinalysis:  Recent Labs Lab 10/10/16 1421 10/15/16 1709  COLORURINE  --  YELLOW*  LABSPEC  --  >1.046*  PHURINE  --  5.0  GLUCOSEU  --  50*  HGBUR  --  SMALL*  BILIRUBINUR negative NEGATIVE  KETONESUR  --  NEGATIVE  PROTEINUR moderate 100*  UROBILINOGEN 0.2  --   NITRITE positive NEGATIVE  LEUKOCYTESUR moderate (2+)* NEGATIVE    Lipid Panel:     Component Value Date/Time   CHOL 144 10/15/2016 1552   CHOL 110 01/18/2014 0059   TRIG 122 10/15/2016 1552   TRIG 122 01/18/2014 0059   HDL 51 10/15/2016 1552    HDL 35 (L) 01/18/2014 0059   CHOLHDL 2.8 10/15/2016 1552   VLDL 24 10/15/2016 1552   VLDL 24 01/18/2014 0059   LDLCALC 69 10/15/2016 1552   LDLCALC 51 01/18/2014 0059    HgbA1C:  Lab Results  Component Value Date   HGBA1C 8.5 06/12/2016    Urine Drug Screen:  No results found for: LABOPIA, COCAINSCRNUR, LABBENZ, AMPHETMU, THCU, LABBARB  Alcohol Level: No results for input(s): ETH in the last 168 hours.  Other results: EKG: sinus rhythm at 70 bpm.  Imaging: Ct Head Wo Contrast  Result Date: 10/15/2016 CLINICAL DATA:  Altered mental status. EXAM: CT HEAD WITHOUT CONTRAST TECHNIQUE: Contiguous axial images were obtained from the base of the skull through the vertex without intravenous contrast. COMPARISON:  Brain MRI 07/08/2016 FINDINGS: Brain: There is no evidence of acute cortical infarct, intracranial hemorrhage, mass, midline shift, or extra-axial fluid collection. A chronic infarct is again seen involving the left basal ganglia and corona radiata with dilatation of the left lateral ventricle and associated wallerian degeneration in the brainstem. Additional mild scattered cerebral white matter hypodensities elsewhere are nonspecific but compatible with chronic small vessel ischemic disease. Vascular: Calcified atherosclerosis at the skullbase. No hyperdense vessel. Skull: No fracture or focal osseous lesion. Sinuses/Orbits: Paranasal sinuses and mastoid air cells are  clear. Bilateral cataract extraction. Other: None. IMPRESSION: 1. No evidence of acute intracranial abnormality. 2. Chronic left basal ganglia infarct. Electronically Signed   By: Logan Bores M.D.   On: 10/15/2016 16:41   Mr Brain Wo Contrast  Result Date: 10/16/2016 CLINICAL DATA:  CVA. EXAM: MRI HEAD WITHOUT CONTRAST TECHNIQUE: Multiplanar, multiecho pulse sequences of the brain and surrounding structures were obtained without intravenous contrast. COMPARISON:  Head CT from yesterday.  Brain MRI 07/08/2016 FINDINGS: Brain:  No acute infarction, hemorrhage, hydrocephalus, extra-axial collection or mass lesion. Remote left basal ganglia and neighboring white matter infarct with ex vacuo dilatation of the left lateral ventricle. There has also been remote low internal capsule lacunar infarct on the right with wallerian degeneration seen into the brainstem. Remote lacunar infarct in the right posterior frontal subcortical white matter. Small remote high posterior right frontal cortex infarct. Chronic rounded DWI hyperintensity at the level of the posterior sella. No pituitary enlargement or underlying nodule seen on the other sequences to suggest this is clinically significant. Mild ventriculomegaly attributed to generalized volume loss. Vascular: Preserved flow voids Skull and upper cervical spine: Negative Sinuses/Orbits: Bilateral cataract resection.  Otherwise negative. IMPRESSION: 1. No acute finding or change since 2017. 2. Remote small/medium vessel infarcts as described. Electronically Signed   By: Monte Fantasia M.D.   On: 10/16/2016 09:48   Dg Chest Portable 1 View  Result Date: 10/15/2016 CLINICAL DATA:  Abdominal pain chest pain since this morning, history diabetes mellitus, hypertension, TIA, GERD, stroke EXAM: PORTABLE CHEST 1 VIEW COMPARISON:  Portable exam 1551 hours compared 12/26/2015 FINDINGS: Upper normal heart size. Aortic atherosclerosis. Slight pulmonary vascular congestion. Mitral annular calcification. Bronchitic changes without definite infiltrate, pleural effusion or pneumothorax. Bones demineralized with note of RIGHT clavicular deformity from old non healed fracture. IMPRESSION: Bronchitic changes without infiltrate. Electronically Signed   By: Lavonia Dana M.D.   On: 10/15/2016 16:15   Ct Angio Chest/abd/pel For Dissection W And/or Wo Contrast  Result Date: 10/15/2016 CLINICAL DATA:  Abdominal pain, chest pain, shortness of breath tenderness with palpation in abdomen, history hypertension, GERD, diabetes  mellitus, stroke EXAM: CT ANGIOGRAPHY CHEST, ABDOMEN AND PELVIS TECHNIQUE: Multidetector CT imaging through the chest, abdomen and pelvis was performed using the standard protocol during bolus administration of intravenous contrast. Multiplanar reconstructed images and MIPs were obtained and reviewed to evaluate the vascular anatomy. Patient had a history of allergy to iodine. No definite history of contrast allergy. Patient received contrast ventral without reaction. CONTRAST:  75 cc Isovue 370 IV COMPARISON:  CT abdomen pelvis 12/23/2015, CT chest 07/28/2005 FINDINGS: CTA CHEST FINDINGS Cardiovascular: Atherosclerotic calcifications aorta, coronary arteries, and proximal great vessels. No evidence of intramural hematoma on precontrast imaging. Ascending thoracic aorta upper normal caliber 3.5 cm diameter. Mitral annular calcification noted. Normal aortic enhancement without dissection. Pulmonary arteries grossly patent on nondedicated exam. No pericardial effusion. Mediastinum/Nodes: Base of cervical region unremarkable. Esophagus normal appearance. No thoracic adenopathy. Lungs/Pleura: Minimal bibasilar subsegmental atelectasis. Lungs otherwise clear. No pulmonary infiltrate, pleural effusion or pneumothorax. Musculoskeletal: Diffuse osseous demineralization. LEFT glenohumeral degenerative changes. No acute osseous findings. Review of the MIP images confirms the above findings. CTA ABDOMEN AND PELVIS FINDINGS VASCULAR Aorta: Atherosclerotic calcification without aneurysm Celiac: Significant calcified plaque at origin. Greater than 50% stenosis suspected. SMA: Significant atherosclerotic calcified plaque at origin with greater than 50% stenosis Renals: Significant calcified atherosclerotic plaque at the origins of both renal arteries with suspected high-grade stenoses bilaterally IMA: Patent though calcified plaque is seen at  the origin Inflow: Atherosclerotic calcifications throughout the common and internal  iliac arteries, less in external iliac and common femoral arteries. Veins: Portal vein and splenic vein appear grossly patent. Review of the MIP images confirms the above findings. NON-VASCULAR Hepatobiliary: Gallbladder surgically absent. Liver normal appearance Pancreas: Normal appearance Spleen: Normal appearance Adrenals/Urinary Tract: Adrenal glands, kidneys, ureters, and bladder normal appearance Stomach/Bowel: Appendix not visualized. Stomach and bowel loops grossly unremarkable for technique Lymphatic: No adenopathy Reproductive: Uterus surgically absent with nonvisualization of ovaries Other: No free air free fluid.  No mass or hernia. Musculoskeletal: Demineralized. Prior L4-L5 fusion. Degenerative disc disease changes lower thoracic and lumbar spine. Review of the MIP images confirms the above findings. IMPRESSION: Extensive atherosclerotic calcifications including aorta, coronary arteries and the origins of multiple abdominal branch vessels as above. No evidence of aortic aneurysm or dissection. Minimal bibasilar atelectasis. No acute intrathoracic, intra-abdominal or intrapelvic abnormalities. Electronically Signed   By: Lavonia Dana M.D.   On: 10/15/2016 16:49     Assessment/Plan: 61 year old female presenting with confusion.  MRI of the brain reviewed and shows no acute changes.  Patient returning to baseline.  Urine culture positive.  Suspect AMS related to metabolic/toxic encephalopathy.  Since patient amnestic of event with evaluate brain activity as well.  Recommendations: 1.  EEG.  If unremarkable, no further neurological testing recommended at this time.    Alexis Goodell, MD Neurology [phone] 10/16/2016, 11:01 AM    Addendum: EEG abnormal.  Would give a trial of Keppra at 566m BID.  No load required.  Patient to follow up with neurology after discharge.    LAlexis Goodell MD Neurology [phone]

## 2016-10-16 NOTE — Progress Notes (Signed)
PT Cancellation Note  Patient Details Name: Susan House MRN: [id number] DOB: June 28, 1956   Cancelled Treatment:    Reason Eval/Treat Not Completed: Patient at procedure or test/unavailable (Consult received and chart reviewed.  Patient currently off unit for diagnostic testing.  Will re-attempt at later time/date as medically appropriate and available.)   Zyiah Withington H. Owens Shark, PT, DPT, NCS 10/16/16, 11:39 AM [phone]

## 2016-10-16 NOTE — Progress Notes (Signed)
[facility] NAME: Susan House    MR#:  [id number]  DATE OF BIRTH:  05/10/1956  SUBJECTIVE:  CHIEF COMPLAINT:   Chief Complaint  Patient presents with  . Abdominal Pain   - admitted with confusion, better now - slow to respond at times  REVIEW OF SYSTEMS:  Review of Systems  Constitutional: Positive for malaise/fatigue. Negative for chills and fever.  HENT: Negative for ear discharge, hearing loss and nosebleeds.   Eyes: Negative for blurred vision and double vision.  Respiratory: Negative for cough, shortness of breath and wheezing.   Cardiovascular: Negative for chest pain, palpitations and leg swelling.  Gastrointestinal: Negative for abdominal pain, constipation, diarrhea, nausea and vomiting.  Genitourinary: Negative for dysuria.  Musculoskeletal: Positive for myalgias.  Neurological: Negative for dizziness, seizures and headaches.    DRUG ALLERGIES:   Allergies  Allergen Reactions  . Morphine And Related Anaphylaxis  . Fentanyl Other (See Comments)    Unknown reaction.  . Reglan [Metoclopramide]     Elevated BP  . Simvastatin Other (See Comments)    Muscle pain  . Betadine [Povidone Iodine] Rash  . Iodine Rash  . Tetracyclines & Related Rash    VITALS:  Blood pressure (!) 136/53, pulse 66, temperature 98.7 F (37.1 C), resp. rate 18, height 5\' 1"  (1.549 m), weight 116.7 kg (257 lb 3.2 oz), SpO2 100 %.  PHYSICAL EXAMINATION:  Physical Exam  GENERAL:  61 y.o.-year-old obese patient lying in the bed with no acute distress.  EYES: Pupils equal, round, reactive to light and accommodation. No scleral icterus. Extraocular muscles intact.  HEENT: Head atraumatic, normocephalic. Oropharynx and nasopharynx clear.  NECK:  Supple, no jugular venous distention. No thyroid enlargement, no tenderness.  LUNGS: Normal breath sounds bilaterally, no wheezing, rales,rhonchi or crepitation. No use of accessory  muscles of respiration.  CARDIOVASCULAR: S1, S2 normal. No murmurs, rubs, or gallops.  ABDOMEN: Soft, nontender, nondistended. Bowel sounds present. No organomegaly or mass.  EXTREMITIES: No pedal edema, cyanosis, or clubbing.  NEUROLOGIC: Cranial nerves II through XII are intact. Muscle strength 5/5 in all extremities. Sensation intact. Gait not checked. Speech is clear, slow to respond PSYCHIATRIC: The patient is alert and oriented x 3.  SKIN: No obvious rash, lesion, or ulcer.    LABORATORY PANEL:   CBC  Recent Labs Lab 10/16/16 0412  WBC 6.8  HGB 12.1  HCT 36.4  PLT 262   ------------------------------------------------------------------------------------------------------------------  Chemistries   Recent Labs Lab 10/16/16 0412  NA 142  K 3.2*  CL 107  CO2 27  GLUCOSE 119*  BUN 15  CREATININE 0.82  CALCIUM 9.1   ------------------------------------------------------------------------------------------------------------------  Cardiac Enzymes  Recent Labs Lab 10/15/16 1552  TROPONINI <0.03   ------------------------------------------------------------------------------------------------------------------  RADIOLOGY:  Ct Head Wo Contrast  Result Date: 10/15/2016 CLINICAL DATA:  Altered mental status. EXAM: CT HEAD WITHOUT CONTRAST TECHNIQUE: Contiguous axial images were obtained from the base of the skull through the vertex without intravenous contrast. COMPARISON:  Brain MRI 07/08/2016 FINDINGS: Brain: There is no evidence of acute cortical infarct, intracranial hemorrhage, mass, midline shift, or extra-axial fluid collection. A chronic infarct is again seen involving the left basal ganglia and corona radiata with dilatation of the left lateral ventricle and associated wallerian degeneration in the brainstem. Additional mild scattered cerebral white matter hypodensities elsewhere are nonspecific but compatible with chronic small vessel ischemic disease. Vascular:  Calcified atherosclerosis at the skullbase. No hyperdense vessel. Skull: No fracture  or focal osseous lesion. Sinuses/Orbits: Paranasal sinuses and mastoid air cells are clear. Bilateral cataract extraction. Other: None. IMPRESSION: 1. No evidence of acute intracranial abnormality. 2. Chronic left basal ganglia infarct. Electronically Signed   By: Logan Bores M.D.   On: 10/15/2016 16:41   Mr Brain Wo Contrast  Result Date: 10/16/2016 CLINICAL DATA:  CVA. EXAM: MRI HEAD WITHOUT CONTRAST TECHNIQUE: Multiplanar, multiecho pulse sequences of the brain and surrounding structures were obtained without intravenous contrast. COMPARISON:  Head CT from yesterday.  Brain MRI 07/08/2016 FINDINGS: Brain: No acute infarction, hemorrhage, hydrocephalus, extra-axial collection or mass lesion. Remote left basal ganglia and neighboring white matter infarct with ex vacuo dilatation of the left lateral ventricle. There has also been remote low internal capsule lacunar infarct on the right with wallerian degeneration seen into the brainstem. Remote lacunar infarct in the right posterior frontal subcortical white matter. Small remote high posterior right frontal cortex infarct. Chronic rounded DWI hyperintensity at the level of the posterior sella. No pituitary enlargement or underlying nodule seen on the other sequences to suggest this is clinically significant. Mild ventriculomegaly attributed to generalized volume loss. Vascular: Preserved flow voids Skull and upper cervical spine: Negative Sinuses/Orbits: Bilateral cataract resection.  Otherwise negative. IMPRESSION: 1. No acute finding or change since 2017. 2. Remote small/medium vessel infarcts as described. Electronically Signed   By: Monte Fantasia M.D.   On: 10/16/2016 09:48   Dg Chest Portable 1 View  Result Date: 10/15/2016 CLINICAL DATA:  Abdominal pain chest pain since this morning, history diabetes mellitus, hypertension, TIA, GERD, stroke EXAM: PORTABLE CHEST 1  VIEW COMPARISON:  Portable exam 1551 hours compared 12/26/2015 FINDINGS: Upper normal heart size. Aortic atherosclerosis. Slight pulmonary vascular congestion. Mitral annular calcification. Bronchitic changes without definite infiltrate, pleural effusion or pneumothorax. Bones demineralized with note of RIGHT clavicular deformity from old non healed fracture. IMPRESSION: Bronchitic changes without infiltrate. Electronically Signed   By: Lavonia Dana M.D.   On: 10/15/2016 16:15   Ct Angio Chest/abd/pel For Dissection W And/or Wo Contrast  Result Date: 10/15/2016 CLINICAL DATA:  Abdominal pain, chest pain, shortness of breath tenderness with palpation in abdomen, history hypertension, GERD, diabetes mellitus, stroke EXAM: CT ANGIOGRAPHY CHEST, ABDOMEN AND PELVIS TECHNIQUE: Multidetector CT imaging through the chest, abdomen and pelvis was performed using the standard protocol during bolus administration of intravenous contrast. Multiplanar reconstructed images and MIPs were obtained and reviewed to evaluate the vascular anatomy. Patient had a history of allergy to iodine. No definite history of contrast allergy. Patient received contrast ventral without reaction. CONTRAST:  75 cc Isovue 370 IV COMPARISON:  CT abdomen pelvis 12/23/2015, CT chest 07/28/2005 FINDINGS: CTA CHEST FINDINGS Cardiovascular: Atherosclerotic calcifications aorta, coronary arteries, and proximal great vessels. No evidence of intramural hematoma on precontrast imaging. Ascending thoracic aorta upper normal caliber 3.5 cm diameter. Mitral annular calcification noted. Normal aortic enhancement without dissection. Pulmonary arteries grossly patent on nondedicated exam. No pericardial effusion. Mediastinum/Nodes: Base of cervical region unremarkable. Esophagus normal appearance. No thoracic adenopathy. Lungs/Pleura: Minimal bibasilar subsegmental atelectasis. Lungs otherwise clear. No pulmonary infiltrate, pleural effusion or pneumothorax.  Musculoskeletal: Diffuse osseous demineralization. LEFT glenohumeral degenerative changes. No acute osseous findings. Review of the MIP images confirms the above findings. CTA ABDOMEN AND PELVIS FINDINGS VASCULAR Aorta: Atherosclerotic calcification without aneurysm Celiac: Significant calcified plaque at origin. Greater than 50% stenosis suspected. SMA: Significant atherosclerotic calcified plaque at origin with greater than 50% stenosis Renals: Significant calcified atherosclerotic plaque at the origins of both renal arteries with  suspected high-grade stenoses bilaterally IMA: Patent though calcified plaque is seen at the origin Inflow: Atherosclerotic calcifications throughout the common and internal iliac arteries, less in external iliac and common femoral arteries. Veins: Portal vein and splenic vein appear grossly patent. Review of the MIP images confirms the above findings. NON-VASCULAR Hepatobiliary: Gallbladder surgically absent. Liver normal appearance Pancreas: Normal appearance Spleen: Normal appearance Adrenals/Urinary Tract: Adrenal glands, kidneys, ureters, and bladder normal appearance Stomach/Bowel: Appendix not visualized. Stomach and bowel loops grossly unremarkable for technique Lymphatic: No adenopathy Reproductive: Uterus surgically absent with nonvisualization of ovaries Other: No free air free fluid.  No mass or hernia. Musculoskeletal: Demineralized. Prior L4-L5 fusion. Degenerative disc disease changes lower thoracic and lumbar spine. Review of the MIP images confirms the above findings. IMPRESSION: Extensive atherosclerotic calcifications including aorta, coronary arteries and the origins of multiple abdominal branch vessels as above. No evidence of aortic aneurysm or dissection. Minimal bibasilar atelectasis. No acute intrathoracic, intra-abdominal or intrapelvic abnormalities. Electronically Signed   By: Lavonia Dana M.D.   On: 10/15/2016 16:49    EKG:   Orders placed or performed  during the hospital encounter of 10/15/16  . ED EKG  . ED EKG  . EKG 12-Lead  . EKG 12-Lead  . EKG 12-Lead  . EKG 12-Lead    ASSESSMENT AND PLAN:   61 y/o Female with history of chronic depression, diabetes, hematuria, hyperlipidemia and hypertension, irritable bowel syndrome, recent CVA with left sided weakness admitted for altered mental status  #1 altered mental status-likely metabolic encephalopathy. -Appreciate neurology consult. EEG ordered to rule out seizures. -MRI without any acute findings. -Mental status back to baseline at this time. -Physical therapy consult is pending.  #2 history of recurrent UTIs-recent urine analysis is negative for infection. -No need for antibiotics at this time.  #3 history of CVA-baseline left-sided weakness. Continue aspirin and statin at this time. -Not a candidate for Plavix or other anticoagulation due to significant history of ulcerative colitis and GI bleed.  #4 hypertension-continue home medications with enalapril, Toprol.  #5 history of depression-stable at this time.  #6 DVT prophylaxis-on Lovenox     All the records are reviewed and case discussed with Care Management/Social Workerr. Management plans discussed with the patient, family and they are in agreement.  CODE STATUS: Full Code  TOTAL TIME TAKING CARE OF THIS PATIENT: 38 minutes.   POSSIBLE D/C IN 1-2 DAYS, DEPENDING ON CLINICAL CONDITION.   Gladstone Lighter M.D on 10/16/2016 at 1:58 PM  Between 7am to 6pm - Pager - [phone]  After 6pm go to [email or web address] - password [email or web address]  Office  [phone]  CC: Primary care physician; Wilhemena Durie, MD

## 2016-10-16 NOTE — Care Management (Signed)
Admitted to [facility] with the diagnosis of TIA under observation status. Lives with husband, Marcello Moores [phone]). Last seen Dr, Rosanna Randy 10/10/16. Home Health per [facility] in the past. Skilled Nursing in [place] in 2000. No home oxygen. Prescriptions are filled at [facility] or [facility] on [address]. Cane, rolling walker, bedside commode, electric wheelchair and raised toilet seat in the home. Takes care of all basic activities of daily living herself, drives. No falls. Good appetite. Husband will transport. Shelbie Ammons RN MSN CCM Care Management

## 2016-10-16 NOTE — Evaluation (Signed)
Physical Therapy Evaluation Patient Details Name: Susan House MRN: [id number] DOB: Jun 24, 1956 Today's Date: 10/16/2016   History of Present Illness  presented to ER and admitted under observation secondary to AMS, abdominal pain.  MRI brain negative for acute change.  Clinical Impression  Upon evaluation, patient alert and oriented to basic information; follows simple commands and demonstrates good effort with all functional activities.  L UE > LE hemiparesis evident (residual from CVA approx 17 years prior), unchanged with this event.  Currently requiring min/mod assist for bed mobility; min assist for sit/stand, SPT and short-distance gait (5') with SPC.  Increased lateral sway bilat with decreased active use/weight shift to L LE, but no overt buckling or LOB.  Suspect performance is near baseline for patient (patient with difficulty describing).  Additional mobility deferred secondary to incontinent bowel episode and need for toileting during session. Will continue to assess/progress as appropriate. Would benefit from skilled PT to address above deficits and promote optimal return to PLOF; Recommend transition to [facility] upon discharge from acute hospitalization.     Follow Up Recommendations Home health PT    Equipment Recommendations       Recommendations for Other Services       Precautions / Restrictions Precautions Precautions: Fall Precaution Comments: No BP L UE Restrictions Weight Bearing Restrictions: No      Mobility  Bed Mobility Overal bed mobility: Needs Assistance Bed Mobility: Supine to Sit     Supine to sit: Min assist;Mod assist     General bed mobility comments: for LE management and truncal elevation  Transfers Overall transfer level: Needs assistance   Transfers: Sit to/from Stand;Stand Pivot Transfers Sit to Stand: Min assist Stand pivot transfers: Min assist          Ambulation/Gait Ambulation/Gait assistance: Min assist Ambulation  Distance (Feet): 5 Feet Assistive device: Straight cane       General Gait Details: broad BOS with choppy steps, increased bilat lateral sway; no buckling or LOB.  Distance limited by incontinent BM requiring transfer to [facility] during session.  Stairs            Wheelchair Mobility    Modified Rankin (Stroke Patients Only)       Balance Overall balance assessment: Needs assistance Sitting-balance support: No upper extremity supported;Feet supported Sitting balance-Leahy Scale: Good     Standing balance support: No upper extremity supported Standing balance-Leahy Scale: Fair                               Pertinent Vitals/Pain Pain Assessment: No/denies pain    Home Living Family/patient expects to be discharged to:: Private residence Living Arrangements: Spouse/significant other Available Help at Discharge: Family;Available 24 hours/day Type of Home: House Home Access: Ramped entrance     Home Layout: One level Home Equipment: Walker - 2 wheels;Walker - 4 wheels;Cane - single point;Wheelchair - Education officer, community - power      Prior Function Level of [place]: Independent with assistive device(s)         Comments: Ambulatory for household distances with SPC, sup/mod indep; manual WC/power scooter for community distances.  Endorses two falls related to health event; otherwise, denies recent fall history.      Hand Dominance        Extremity/Trunk Assessment   Upper Extremity Assessment Upper Extremity Assessment:  (L UE hemiparesis, increased flexor tone with exertion)    Lower Extremity Assessment Lower Extremity Assessment:  (  L LE grossly 3-/5, decreased speed of activation, decreased active weight shift/use during functional activities (baseline hemiparesis))       Communication      Cognition Arousal/Alertness: Awake/alert Behavior During Therapy: WFL for tasks assessed/performed Overall Cognitive Status: Within Functional Limits  for tasks assessed                                 General Comments: increased processing time      General Comments      Exercises Other Exercises Other Exercises: Toilet transfer, SPT with SPC vs UE on armrest, cga/min assist; sit/stand from [facility], min assist; hygiene for continent/incontinent BM, dep   Assessment/Plan    PT Assessment Patient needs continued PT services  PT Problem List Decreased strength;Decreased range of motion;Decreased activity tolerance;Decreased balance;Decreased mobility;Decreased coordination;Decreased cognition;Decreased knowledge of use of DME;Decreased safety awareness;Decreased knowledge of precautions;Obesity       PT Treatment Interventions DME instruction;Gait training;Functional mobility training;Therapeutic activities;Therapeutic exercise;Balance training;Patient/family education    PT Goals (Current goals can be found in the Care Plan section)  Acute Rehab PT Goals Patient Stated Goal: to get better PT Goal Formulation: With patient/family Time For Goal Achievement: 10/30/16 Potential to Achieve Goals: Good    Frequency Min 2X/week   Barriers to discharge Decreased caregiver support      Co-evaluation               End of Session Equipment Utilized During Treatment: Gait belt Activity Tolerance: Patient tolerated treatment well Patient left: in chair;with call bell/phone within reach;with chair alarm set;with family/visitor present Nurse Communication: Mobility status PT Visit Diagnosis: Muscle weakness (generalized) (M62.81);Unsteadiness on feet (R26.81)    Time: 5830-9407 PT Time Calculation (min) (ACUTE ONLY): 36 min   Charges:   PT Evaluation $PT Eval Low Complexity: 1 Procedure PT Treatments $Therapeutic Activity: 8-22 mins   PT G Codes:   PT G-Codes **NOT FOR INPATIENT CLASS** Functional Assessment Tool Used: AM-PAC 6 Clicks Basic Mobility;Clinical judgement Functional Limitation: Mobility: Walking  and moving around Mobility: Walking and Moving Around Current Status (W8088): At least 40 percent but less than 60 percent impaired, limited or restricted Mobility: Walking and Moving Around Goal Status [phone]): At least 20 percent but less than 40 percent impaired, limited or restricted   Sahian Kerney H. Owens Shark, PT, DPT, NCS 10/16/16, 4:36 PM [phone]

## 2016-10-16 NOTE — Progress Notes (Signed)
61 y/o F on Lovenox for DVT prophylaxis with BMI > 40. Will increase Lovenox to 40 mg bid.   Ulice Dash, PharmD Clinical Pharmacist

## 2016-10-17 ENCOUNTER — Telehealth: Payer: Self-pay | Admitting: Neurology

## 2016-10-17 ENCOUNTER — Telehealth: Payer: Self-pay

## 2016-10-17 LAB — GLUCOSE, CAPILLARY
GLUCOSE-CAPILLARY: 158 mg/dL — AB (ref 65–99)
Glucose-Capillary: 176 mg/dL — ABNORMAL HIGH (ref 65–99)

## 2016-10-17 LAB — HEMOGLOBIN A1C
Hgb A1c MFr Bld: 7.8 % — ABNORMAL HIGH (ref 4.8–5.6)
Mean Plasma Glucose: 177 mg/dL

## 2016-10-17 MED ORDER — NYSTATIN 100000 UNIT/GM EX POWD
Freq: Two times a day (BID) | CUTANEOUS | Status: DC
  Administered 2016-10-17: 08:00:00 via TOPICAL
  Filled 2016-10-17: qty 15

## 2016-10-17 NOTE — Discharge Summary (Signed)
[facility] NAME: Susan House    MR#:  [id number]  DATE OF BIRTH:  1955/08/07  DATE OF ADMISSION:  10/15/2016 ADMITTING PHYSICIAN: Vaughan Basta, MD  DATE OF DISCHARGE: 10/17/2016  PRIMARY CARE PHYSICIAN: Wilhemena Durie, MD    ADMISSION DIAGNOSIS:  RLQ abdominal pain [R10.31] Cerebral infarction ([place]) [I63.9] CVA (cerebral infarction) [I63.9] Abdominal pain, unspecified abdominal location [R10.9] Transient cerebral ischemia, unspecified type [G45.9]  DISCHARGE DIAGNOSIS:  Acute encephalopathy due to New onset seizures ( EEG positive with left temporal region) h/o CVA  SECONDARY DIAGNOSIS:   Past Medical History:  Diagnosis Date  . Allergy   . Arthritis   . Cellulitis and abscess of face   . Depression   . Diabetes mellitus ([place])   . Edema   . GERD (gastroesophageal reflux disease)   . Hematuria   . History of echocardiogram    2008: normal, 2011: normal LVSF  . History of nuclear stress test    a. 12/2009: lexiscan - negative  . Hyperlipidemia   . Hypertension   . IBS (irritable bowel syndrome)   . Migraine   . Morbid obesity ([place])   . Nocturia   . Stroke ([place]) 2000  . TIA (transient ischemic attack) 2010  . Urgency of micturation   . Urinary frequency   . Urinary incontinence     HOSPITAL COURSE:  61 y/o Female with history of chronic depression, diabetes, hematuria, hyperlipidemia and hypertension, irritable bowel syndrome, recent CVA with left sided weakness admitted for altered mental status  #1 altered mental status-likely metabolic encephalopathy. -Appreciate neurology consult. EEG abnormal with positive seizures spikes from left temporal area.now on Keppra per neuro rec. Pt will fo/u neurology in [place]. Pt advised not to drive till cleared by her Neurologist. -seen by Dr Doy Mince -MRI without any acute findings. -Mental status back to baseline at this time. -Physical  therapy recommends HHPT  #2 history of recurrent UTIs-recent urine analysis is negative for infection. -No need for antibiotics at this time.  #3 history of CVA-baseline left-sided weakness. Continue aspirin and statin at this time. -Not a candidate for Plavix or other anticoagulation due to significant history of ulcerative colitis and GI bleed.  #4 hypertension-continue home medications with enalapril, Toprol.  #5 history of depression-stable at this time.  #6 DVT prophylaxis-on Lovenox  Overall stable for d/c to home with HHPT D/w husband  CONSULTS OBTAINED:  Treatment Team:  Alexis Goodell, MD  DRUG ALLERGIES:   Allergies  Allergen Reactions  . Morphine And Related Anaphylaxis  . Fentanyl Other (See Comments)    Unknown reaction.  . Reglan [Metoclopramide]     Elevated BP  . Simvastatin Other (See Comments)    Muscle pain  . Betadine [Povidone Iodine] Rash  . Iodine Rash  . Tetracyclines & Related Rash    DISCHARGE MEDICATIONS:   Current Discharge Medication List    START taking these medications   Details  levETIRAcetam (KEPPRA) 500 MG tablet Take 1 tablet (500 mg total) by mouth 2 (two) times daily. Qty: 60 tablet, Refills: 2      CONTINUE these medications which have NOT CHANGED   Details  amLODipine (NORVASC) 10 MG tablet Take 1 tablet (10 mg total) by mouth daily. Qty: 90 tablet, Refills: 3   Associated Diagnoses: Essential hypertension    aspirin EC 81 MG tablet Take 81 mg by mouth daily.    atorvastatin (LIPITOR) 40 MG tablet Take 0.5  tablets (20 mg total) by mouth at bedtime. Qty: 90 tablet, Refills: 3    baclofen (LIORESAL) 20 MG tablet Take 1 tablet by mouth 2 (two) times daily as needed.     Cranberry 1000 MG CAPS Take 2,000 mg by mouth daily.     cyclobenzaprine (FLEXERIL) 10 MG tablet Take 1 tablet (10 mg total) by mouth 3 (three) times daily as needed for muscle spasms. Qty: 30 tablet, Refills: 0   Associated Diagnoses: Muscle  spasm    diphenoxylate-atropine (LOMOTIL) 2.5-0.025 MG tablet 2 tablets initially and then 1 tablet every 6 hours as needed for diarrhea Qty: 60 tablet, Refills: 1    Doxepin HCl (ZONALON) 5 % CREA Apply 1 application topically 4 (four) times daily. Qty: 45 g, Refills: 2   Associated Diagnoses: Eczema, unspecified type    enalapril (VASOTEC) 20 MG tablet Take 1 tablet (20 mg total) by mouth 2 (two) times daily. Qty: 180 tablet, Refills: 0    gabapentin (NEURONTIN) 100 MG capsule Take 1 capsule (100 mg total) by mouth 3 (three) times daily. Qty: 270 capsule, Refills: 3    insulin aspart protamine- aspart (NOVOLOG MIX 70/30) (70-30) 100 UNIT/ML injection Inject 25-30 Units into the skin 2 (two) times daily.     magnesium oxide (MAG-OX) 400 (241.3 Mg) MG tablet Take 1 tablet (400 mg total) by mouth daily. Qty: 90 tablet, Refills: 3   Associated Diagnoses: Dehydration    metFORMIN (GLUCOPHAGE) 1000 MG tablet Take 1 tablet (1,000 mg total) by mouth 2 (two) times daily. Qty: 180 tablet, Refills: 3    metoprolol succinate (TOPROL-XL) 50 MG 24 hr tablet Take 1 tablet (50 mg total) by mouth daily. Take with or immediately following a meal. Qty: 30 tablet, Refills: 11    nitroGLYCERIN (NITROSTAT) 0.4 MG SL tablet Place 1 tablet (0.4 mg total) under the tongue every 5 (five) minutes as needed for chest pain. Qty: 25 tablet, Refills: 0    oxybutynin (DITROPAN) 5 MG tablet Take 1 tablet (5 mg total) by mouth 3 (three) times daily. Qty: 270 tablet, Refills: 3    pantoprazole (PROTONIX) 40 MG tablet Take 1 tablet (40 mg total) by mouth daily. Qty: 90 tablet, Refills: 3   Associated Diagnoses: Gastroesophageal reflux disease, esophagitis presence not specified    ACCU-CHEK AVIVA PLUS test strip TEST BLOOD SUGAR THREE TIMES DAILY Qty: 300 each, Refills: 3    ACCU-CHEK SOFTCLIX LANCETS lancets CHECK BLOOD SUGAR THREE TIMES DAILY Qty: 300 each, Refills: 3    Blood Glucose Monitoring Suppl  (ACCU-CHEK AVIVA PLUS) w/Device KIT Check sugar three times daily DX E11.9-needs a meter Qty: 1 kit, Refills: 0    Lancet Devices (ACCU-CHEK SOFTCLIX) lancets Check sugar three times daily DX E11.9 Qty: 100 each, Refills: 3      STOP taking these medications     HYDROcodone-acetaminophen (NORCO) 10-325 MG tablet      ciprofloxacin (CIPRO) 500 MG tablet      lidocaine (LIDODERM) 5 %      mometasone (ELOCON) 0.1 % cream      oxyCODONE (OXY IR/ROXICODONE) 5 MG immediate release tablet      promethazine (PHENERGAN) 25 MG tablet      sulfacetamide (BLEPH-10) 10 % ophthalmic solution         If you experience worsening of your admission symptoms, develop shortness of breath, life threatening emergency, suicidal or homicidal thoughts you must seek medical attention immediately by calling 911 or calling your MD immediately  if  symptoms less severe.  You Must read complete instructions/literature along with all the possible adverse reactions/side effects for all the Medicines you take and that have been prescribed to you. Take any new Medicines after you have completely understood and accept all the possible adverse reactions/side effects.   Please note  You were cared for by a hospitalist during your hospital stay. If you have any questions about your discharge medications or the care you received while you were in the hospital after you are discharged, you can call the unit and asked to speak with the hospitalist on call if the hospitalist that took care of you is not available. Once you are discharged, your primary care physician will handle any further medical issues. Please note that NO REFILLS for any discharge medications will be authorized once you are discharged, as it is imperative that you return to your primary care physician (or establish a relationship with a primary care physician if you do not have one) for your aftercare needs so that they can reassess your need for  medications and monitor your lab values. Today   SUBJECTIVE    Doing well VITAL SIGNS:  Blood pressure (!) 135/57, pulse 95, temperature 97.8 F (36.6 C), temperature source Oral, resp. rate 17, height 5' 1"  (1.549 m), weight 116.7 kg (257 lb 3.2 oz), SpO2 97 %.  I/O:    Intake/Output Summary (Last 24 hours) at 10/17/16 0958 Last data filed at 10/17/16 0550  Gross per 24 hour  Intake              240 ml  Output                1 ml  Net              239 ml    PHYSICAL EXAMINATION:  GENERAL:  61 y.o.-year-old patient lying in the bed with no acute distress.  EYES: Pupils equal, round, reactive to light and accommodation. No scleral icterus. Extraocular muscles intact.  HEENT: Head atraumatic, normocephalic. Oropharynx and nasopharynx clear.  NECK:  Supple, no jugular venous distention. No thyroid enlargement, no tenderness.  LUNGS: Normal breath sounds bilaterally, no wheezing, rales,rhonchi or crepitation. No use of accessory muscles of respiration.  CARDIOVASCULAR: S1, S2 normal. No murmurs, rubs, or gallops.  ABDOMEN: Soft, non-tender, non-distended. Bowel sounds present. No organomegaly or mass.  EXTREMITIES: No pedal edema, cyanosis, or clubbing.  NEUROLOGIC: Cranial nerves II through XII are intact. Muscle strength 5/5 in all extremities. Sensation intact. Gait not checked.  PSYCHIATRIC: The patient is alert and oriented x 3.  SKIN: No obvious rash, lesion, or ulcer.   DATA REVIEW:   CBC   Recent Labs Lab 10/16/16 0412  WBC 6.8  HGB 12.1  HCT 36.4  PLT 262    Chemistries   Recent Labs Lab 10/16/16 0412  NA 142  K 3.2*  CL 107  CO2 27  GLUCOSE 119*  BUN 15  CREATININE 0.82  CALCIUM 9.1    Microbiology Results   Recent Results (from the past 240 hour(s))  Urine Culture     Status: Abnormal   Collection Time: 10/10/16  2:36 PM  Result Value Ref Range Status   Urine Culture, Routine Final report (A)  Final   Urine Culture result 1 Escherichia coli  (A)  Final    Comment: Greater than 100,000 colony forming units per mL Cefazolin <=4 ug/mL Cefazolin with an MIC <=16 predicts susceptibility to the oral agents cefaclor, cefdinir,  cefpodoxime, cefprozil, cefuroxime, cephalexin, and loracarbef when used for therapy of uncomplicated urinary tract infections due to E. coli, Klebsiella pneumoniae, and Proteus mirabilis.    ANTIMICROBIAL SUSCEPTIBILITY Comment  Final    Comment:       ** S = Susceptible; I = Intermediate; R = Resistant **                    P = Positive; N = Negative             MICS are expressed in micrograms per mL    Antibiotic                 RSLT#1    RSLT#2    RSLT#3    RSLT#4 Amoxicillin/Clavulanic Acid    S Ampicillin                     S Cefepime                       S Ceftriaxone                    S Cefuroxime                     S Cephalothin                    S Ciprofloxacin                  S Ertapenem                      S Gentamicin                     S Imipenem                       S Levofloxacin                   S Nitrofurantoin                 S Piperacillin                   S Tetracycline                   S Tobramycin                     S Trimethoprim/Sulfa             R     RADIOLOGY:  Ct Head Wo Contrast  Result Date: 10/15/2016 CLINICAL DATA:  Altered mental status. EXAM: CT HEAD WITHOUT CONTRAST TECHNIQUE: Contiguous axial images were obtained from the base of the skull through the vertex without intravenous contrast. COMPARISON:  Brain MRI 07/08/2016 FINDINGS: Brain: There is no evidence of acute cortical infarct, intracranial hemorrhage, mass, midline shift, or extra-axial fluid collection. A chronic infarct is again seen involving the left basal ganglia and corona radiata with dilatation of the left lateral ventricle and associated wallerian degeneration in the brainstem. Additional mild scattered cerebral white matter hypodensities elsewhere are nonspecific but compatible with  chronic small vessel ischemic disease. Vascular: Calcified atherosclerosis at the skullbase. No hyperdense vessel. Skull: No fracture or focal osseous lesion. Sinuses/Orbits: Paranasal sinuses and mastoid air cells are clear. Bilateral cataract extraction. Other: None. IMPRESSION: 1. No evidence of acute intracranial abnormality.  2. Chronic left basal ganglia infarct. Electronically Signed   By: Logan Bores M.D.   On: 10/15/2016 16:41   Mr Brain Wo Contrast  Result Date: 10/16/2016 CLINICAL DATA:  CVA. EXAM: MRI HEAD WITHOUT CONTRAST TECHNIQUE: Multiplanar, multiecho pulse sequences of the brain and surrounding structures were obtained without intravenous contrast. COMPARISON:  Head CT from yesterday.  Brain MRI 07/08/2016 FINDINGS: Brain: No acute infarction, hemorrhage, hydrocephalus, extra-axial collection or mass lesion. Remote left basal ganglia and neighboring white matter infarct with ex vacuo dilatation of the left lateral ventricle. There has also been remote low internal capsule lacunar infarct on the right with wallerian degeneration seen into the brainstem. Remote lacunar infarct in the right posterior frontal subcortical white matter. Small remote high posterior right frontal cortex infarct. Chronic rounded DWI hyperintensity at the level of the posterior sella. No pituitary enlargement or underlying nodule seen on the other sequences to suggest this is clinically significant. Mild ventriculomegaly attributed to generalized volume loss. Vascular: Preserved flow voids Skull and upper cervical spine: Negative Sinuses/Orbits: Bilateral cataract resection.  Otherwise negative. IMPRESSION: 1. No acute finding or change since 2017. 2. Remote small/medium vessel infarcts as described. Electronically Signed   By: Monte Fantasia M.D.   On: 10/16/2016 09:48   Dg Chest Portable 1 View  Result Date: 10/15/2016 CLINICAL DATA:  Abdominal pain chest pain since this morning, history diabetes mellitus,  hypertension, TIA, GERD, stroke EXAM: PORTABLE CHEST 1 VIEW COMPARISON:  Portable exam 1551 hours compared 12/26/2015 FINDINGS: Upper normal heart size. Aortic atherosclerosis. Slight pulmonary vascular congestion. Mitral annular calcification. Bronchitic changes without definite infiltrate, pleural effusion or pneumothorax. Bones demineralized with note of RIGHT clavicular deformity from old non healed fracture. IMPRESSION: Bronchitic changes without infiltrate. Electronically Signed   By: Lavonia Dana M.D.   On: 10/15/2016 16:15   Ct Angio Chest/abd/pel For Dissection W And/or Wo Contrast  Result Date: 10/15/2016 CLINICAL DATA:  Abdominal pain, chest pain, shortness of breath tenderness with palpation in abdomen, history hypertension, GERD, diabetes mellitus, stroke EXAM: CT ANGIOGRAPHY CHEST, ABDOMEN AND PELVIS TECHNIQUE: Multidetector CT imaging through the chest, abdomen and pelvis was performed using the standard protocol during bolus administration of intravenous contrast. Multiplanar reconstructed images and MIPs were obtained and reviewed to evaluate the vascular anatomy. Patient had a history of allergy to iodine. No definite history of contrast allergy. Patient received contrast ventral without reaction. CONTRAST:  75 cc Isovue 370 IV COMPARISON:  CT abdomen pelvis 12/23/2015, CT chest 07/28/2005 FINDINGS: CTA CHEST FINDINGS Cardiovascular: Atherosclerotic calcifications aorta, coronary arteries, and proximal great vessels. No evidence of intramural hematoma on precontrast imaging. Ascending thoracic aorta upper normal caliber 3.5 cm diameter. Mitral annular calcification noted. Normal aortic enhancement without dissection. Pulmonary arteries grossly patent on nondedicated exam. No pericardial effusion. Mediastinum/Nodes: Base of cervical region unremarkable. Esophagus normal appearance. No thoracic adenopathy. Lungs/Pleura: Minimal bibasilar subsegmental atelectasis. Lungs otherwise clear. No pulmonary  infiltrate, pleural effusion or pneumothorax. Musculoskeletal: Diffuse osseous demineralization. LEFT glenohumeral degenerative changes. No acute osseous findings. Review of the MIP images confirms the above findings. CTA ABDOMEN AND PELVIS FINDINGS VASCULAR Aorta: Atherosclerotic calcification without aneurysm Celiac: Significant calcified plaque at origin. Greater than 50% stenosis suspected. SMA: Significant atherosclerotic calcified plaque at origin with greater than 50% stenosis Renals: Significant calcified atherosclerotic plaque at the origins of both renal arteries with suspected high-grade stenoses bilaterally IMA: Patent though calcified plaque is seen at the origin Inflow: Atherosclerotic calcifications throughout the common and internal iliac arteries, less in  external iliac and common femoral arteries. Veins: Portal vein and splenic vein appear grossly patent. Review of the MIP images confirms the above findings. NON-VASCULAR Hepatobiliary: Gallbladder surgically absent. Liver normal appearance Pancreas: Normal appearance Spleen: Normal appearance Adrenals/Urinary Tract: Adrenal glands, kidneys, ureters, and bladder normal appearance Stomach/Bowel: Appendix not visualized. Stomach and bowel loops grossly unremarkable for technique Lymphatic: No adenopathy Reproductive: Uterus surgically absent with nonvisualization of ovaries Other: No free air free fluid.  No mass or hernia. Musculoskeletal: Demineralized. Prior L4-L5 fusion. Degenerative disc disease changes lower thoracic and lumbar spine. Review of the MIP images confirms the above findings. IMPRESSION: Extensive atherosclerotic calcifications including aorta, coronary arteries and the origins of multiple abdominal branch vessels as above. No evidence of aortic aneurysm or dissection. Minimal bibasilar atelectasis. No acute intrathoracic, intra-abdominal or intrapelvic abnormalities. Electronically Signed   By: Lavonia Dana M.D.   On: 10/15/2016  16:49     Management plans discussed with the patient, family and they are in agreement.  CODE STATUS:     Code Status Orders        Start     Ordered   10/15/16 2202  Full code  Continuous     10/15/16 2201    Code Status History    Date Active Date Inactive Code Status Order ID Comments User Context   07/08/2016  4:37 AM 07/11/2016  4:10 PM Full Code [id number]  Saundra Shelling, MD ED   12/26/2015  6:47 PM 12/28/2015  8:27 PM Full Code [id number]  Idelle Crouch, MD Inpatient   07/22/2015  7:35 AM 07/23/2015  3:05 PM Full Code [id number]  Saundra Shelling, MD Inpatient    Advance Directive Documentation     Most Recent Value  Type of Advance Directive  Living will  Pre-existing out of facility DNR order (yellow form or pink MOST form)  -  "MOST" Form in Place?  -      TOTAL TIME TAKING CARE OF THIS PATIENT: 40 minutes.    Gracelynn Bircher M.D on 10/17/2016 at 9:58 AM  Between 7am to 6pm - Pager - [phone] After 6pm go to [email or web address] - password [facility]  Office  [phone]  CC: Primary care physician; Wilhemena Durie, MD

## 2016-10-17 NOTE — Telephone Encounter (Signed)
Patient's sister Marliss Coots called and stated that her sister was seen in the ER yesterday at [facility] and  Was told to call and schedule apt With Dr. Brett Fairy for  f/u seizures in 2 weeks/ they stated that the person that answered the phone told them Dr. Brett Fairy does not have any apt's until June/ they are wanting to see if pt can been seen sooner.

## 2016-10-17 NOTE — Care Management (Signed)
Physical therapy evaluation completed. Recommending home health and physical therapy. Discussed agencies with [name] at the bedside. Discussed that she has [employer] as insurance. This insurance requires a co-pay for services in the home. Declines services at this time.  Discharge to home per Dr. [name] Shelbie Ammons RN MSN CCM Care Management

## 2016-10-17 NOTE — Discharge Instructions (Signed)
PAteint advised NOT to drive due to her new onset seizures. She will f/u with her Neurologist as out pt

## 2016-10-17 NOTE — Telephone Encounter (Signed)
Patient called stating that she was discharged from the hospital today, when she arrived back home her blood sugar was 84. She states she ate some chocolate candy and M&M's and her sugar dropped down to 65. Patient states she feels weak and lightheaded, but no different that how she normally feels. Patient denies any blurred vision. I advised Dr. Rosanna Randy of patients condition and he recommends that patient drink something sweet such as orange juice or sweet tea. He also states that if she develops any other symptoms or starts to feel worse, to go to the ER.  Patient was advised of this and she verbally voiced understanding.

## 2016-10-18 ENCOUNTER — Other Ambulatory Visit: Payer: Self-pay | Admitting: Family Medicine

## 2016-10-18 NOTE — Telephone Encounter (Signed)
I spoke to [facility] and was able to schedule the patient for 4/17.

## 2016-10-19 ENCOUNTER — Other Ambulatory Visit: Payer: Self-pay

## 2016-10-19 LAB — RNA QUALITATIVE

## 2016-10-19 LAB — HIV 1/2 AB DIFFERENTIATION
HIV 1 AB: NEGATIVE
HIV 2 AB: NEGATIVE
NOTE (HIV CONF MULTISPOT): NEGATIVE

## 2016-10-19 LAB — HIV ANTIBODY (ROUTINE TESTING W REFLEX): HIV SCREEN 4TH GENERATION: REACTIVE — AB

## 2016-10-24 DIAGNOSIS — L851 Acquired keratosis [keratoderma] palmaris et plantaris: Secondary | ICD-10-CM | POA: Diagnosis not present

## 2016-10-24 DIAGNOSIS — E119 Type 2 diabetes mellitus without complications: Secondary | ICD-10-CM | POA: Diagnosis not present

## 2016-10-24 DIAGNOSIS — B351 Tinea unguium: Secondary | ICD-10-CM | POA: Diagnosis not present

## 2016-10-30 ENCOUNTER — Inpatient Hospital Stay: Payer: PPO | Admitting: Family Medicine

## 2016-10-31 ENCOUNTER — Encounter: Payer: Self-pay | Admitting: Family Medicine

## 2016-10-31 ENCOUNTER — Ambulatory Visit (INDEPENDENT_AMBULATORY_CARE_PROVIDER_SITE_OTHER): Payer: PPO | Admitting: Family Medicine

## 2016-10-31 ENCOUNTER — Ambulatory Visit: Payer: Self-pay | Admitting: Neurology

## 2016-10-31 ENCOUNTER — Inpatient Hospital Stay: Payer: PPO | Admitting: Family Medicine

## 2016-10-31 VITALS — BP 162/82 | HR 90 | Temp 97.6°F | Resp 16

## 2016-10-31 DIAGNOSIS — I10 Essential (primary) hypertension: Secondary | ICD-10-CM | POA: Diagnosis not present

## 2016-10-31 DIAGNOSIS — E118 Type 2 diabetes mellitus with unspecified complications: Secondary | ICD-10-CM | POA: Diagnosis not present

## 2016-10-31 DIAGNOSIS — G40909 Epilepsy, unspecified, not intractable, without status epilepticus: Secondary | ICD-10-CM

## 2016-10-31 DIAGNOSIS — Z794 Long term (current) use of insulin: Secondary | ICD-10-CM

## 2016-10-31 MED ORDER — METOPROLOL SUCCINATE ER 100 MG PO TB24: 100.0000 mg | ORAL_TABLET | Freq: Every day | ORAL | 11 refills | Status: DC
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
## 2016-11-01 ENCOUNTER — Ambulatory Visit: Payer: PPO | Admitting: Family Medicine

## 2016-11-01 ENCOUNTER — Ambulatory Visit: Payer: PPO

## 2016-11-06 ENCOUNTER — Ambulatory Visit (INDEPENDENT_AMBULATORY_CARE_PROVIDER_SITE_OTHER): Payer: PPO | Admitting: Family Medicine

## 2016-11-06 ENCOUNTER — Encounter: Payer: Self-pay | Admitting: Family Medicine

## 2016-11-06 VITALS — BP 130/64 | HR 63 | Temp 98.5°F | Resp 16

## 2016-11-06 DIAGNOSIS — R062 Wheezing: Secondary | ICD-10-CM

## 2016-11-06 NOTE — Progress Notes (Signed)
Subjective:     Patient ID: Susan House, female   DOB: 02-Apr-1956, 61 y.o.   MRN: [id number]  HPI  Chief Complaint  Patient presents with  . Wheezing    Patient comes in office today with complaints of wheezing and shortness of breath that began about 2hrs ago. Patient denies any URI symptoms.   States she was eating [place] at that time that her husband had prepared. Reports taking hydrocodone during the night and again two hours before eating. Reports episode has resolved. No precedent allergy or reflux sx (controlled with PPI). Has been started on Keppra recently for a seizure disorder. No special consistency diet. Accompanied by her husband today.   Review of Systems     Objective:   Physical Exam  Constitutional: She appears well-developed and well-nourished. No distress (sitting in a w/c per baseline).  Cardiovascular: Normal rate and regular rhythm.   Pulmonary/Chest: Breath sounds normal. She has no wheezes.       Assessment:    1. Wheezing: transient-suspect due to aspiration episode.     Plan:    Swallow evaluation if recurrent. Avoid pain medication 4 hours before eating if possible.

## 2016-11-06 NOTE — Patient Instructions (Signed)
Avoid pain medication < 4 hours before a meal. If you have recurrent wheezing with meals-call for swallowing study.

## 2016-11-14 ENCOUNTER — Telehealth: Payer: Self-pay | Admitting: Family Medicine

## 2016-11-14 NOTE — Telephone Encounter (Signed)
Pt is returning call.  CB#[phone]/MW

## 2016-11-16 ENCOUNTER — Ambulatory Visit (INDEPENDENT_AMBULATORY_CARE_PROVIDER_SITE_OTHER): Payer: PPO | Admitting: Family Medicine

## 2016-11-16 ENCOUNTER — Encounter: Payer: Self-pay | Admitting: Family Medicine

## 2016-11-16 VITALS — BP 142/72 | HR 68 | Temp 98.0°F | Resp 16

## 2016-11-16 DIAGNOSIS — M79672 Pain in left foot: Secondary | ICD-10-CM | POA: Diagnosis not present

## 2016-11-16 MED ORDER — MELOXICAM 7.5 MG PO TABS: 7.5000 mg | ORAL_TABLET | Freq: Two times a day (BID) | ORAL | 0 refills | Status: DC | PRN

## 2016-11-16 NOTE — Progress Notes (Signed)
     Patient: Susan House Female    DOB: 08/05/1955   60 y.o.   MRN: [id number] Visit Date: 11/16/2016  Today's Provider:   Jr, MD   Chief Complaint  Patient presents with  . Foot Pain   Subjective:    HPI  Patient comes in today c/o left foot pain. She reports that this has been ongoing for 2-3 days. She states that she has been using Tylenol for the pain. She does occasionally have swelling, but states it is minor.     Allergies  Allergen Reactions  . Morphine And Related Anaphylaxis  . Fentanyl Other (See Comments)    Unknown reaction.  . Reglan [Metoclopramide]     Elevated BP  . Simvastatin Other (See Comments)    Muscle pain  . Betadine [Povidone Iodine] Rash  . Iodine Rash  . Tetracyclines & Related Rash     Current Outpatient Prescriptions:  .  ACCU-CHEK AVIVA PLUS test strip, TEST BLOOD SUGAR THREE TIMES DAILY, Disp: 300 each, Rfl: 3 .  ACCU-CHEK SOFTCLIX LANCETS lancets, CHECK BLOOD SUGAR THREE TIMES DAILY, Disp: 300 each, Rfl: 3 .  amLODipine (NORVASC) 10 MG tablet, Take 1 tablet (10 mg total) by mouth daily., Disp: 90 tablet, Rfl: 3 .  aspirin EC 81 MG tablet, Take 81 mg by mouth daily., Disp: , Rfl:  .  atorvastatin (LIPITOR) 40 MG tablet, Take 0.5 tablets (20 mg total) by mouth at bedtime., Disp: 90 tablet, Rfl: 3 .  baclofen (LIORESAL) 20 MG tablet, Take 1 tablet by mouth 2 (two) times daily as needed. , Disp: , Rfl:  .  Blood Glucose Monitoring Suppl (ACCU-CHEK AVIVA PLUS) w/Device KIT, Check sugar three times daily DX E11.9-needs a meter, Disp: 1 kit, Rfl: 0 .  Cranberry 1000 MG CAPS, Take 2,000 mg by mouth daily. , Disp: , Rfl:  .  cyclobenzaprine (FLEXERIL) 10 MG tablet, Take 1 tablet (10 mg total) by mouth 3 (three) times daily as needed for muscle spasms., Disp: 30 tablet, Rfl: 0 .  diphenoxylate-atropine (LOMOTIL) 2.5-0.025 MG tablet, 2 tablets initially and then 1 tablet every 6 hours as needed for diarrhea, Disp: 60 tablet,  Rfl: 1 .  Doxepin HCl (ZONALON) 5 % CREA, Apply 1 application topically 4 (four) times daily., Disp: 45 g, Rfl: 2 .  enalapril (VASOTEC) 20 MG tablet, Take 1 tablet (20 mg total) by mouth 2 (two) times daily., Disp: 180 tablet, Rfl: 0 .  gabapentin (NEURONTIN) 100 MG capsule, Take 1 capsule (100 mg total) by mouth 3 (three) times daily., Disp: 270 capsule, Rfl: 3 .  HYDROcodone-acetaminophen (NORCO) 10-325 MG tablet, , Disp: , Rfl:  .  insulin aspart protamine- aspart (NOVOLOG MIX 70/30) (70-30) 100 UNIT/ML injection, Inject 25-30 Units into the skin 2 (two) times daily. , Disp: , Rfl:  .  Lancet Devices (ACCU-CHEK SOFTCLIX) lancets, Check sugar three times daily DX E11.9, Disp: 100 each, Rfl: 3 .  levETIRAcetam (KEPPRA) 500 MG tablet, Take 1 tablet (500 mg total) by mouth 2 (two) times daily., Disp: 60 tablet, Rfl: 2 .  magnesium oxide (MAG-OX) 400 (241.3 Mg) MG tablet, Take 1 tablet (400 mg total) by mouth daily., Disp: 90 tablet, Rfl: 3 .  metFORMIN (GLUCOPHAGE) 1000 MG tablet, Take 1 tablet (1,000 mg total) by mouth 2 (two) times daily., Disp: 180 tablet, Rfl: 3 .  metoprolol succinate (TOPROL-XL) 100 MG 24 hr tablet, Take 1 tablet (100 mg total) by mouth daily. Take with   or immediately following a meal., Disp: 30 tablet, Rfl: 11 .  nitroGLYCERIN (NITROSTAT) 0.4 MG SL tablet, Place 1 tablet (0.4 mg total) under the tongue every 5 (five) minutes as needed for chest pain., Disp: 25 tablet, Rfl: 0 .  oxybutynin (DITROPAN) 5 MG tablet, Take 1 tablet (5 mg total) by mouth 3 (three) times daily., Disp: 270 tablet, Rfl: 3 .  pantoprazole (PROTONIX) 40 MG tablet, Take 1 tablet (40 mg total) by mouth daily., Disp: 90 tablet, Rfl: 3  Review of Systems  Constitutional: Negative.   HENT: Negative.   Eyes: Negative.   Respiratory: Negative.   Cardiovascular: Negative.   Endocrine: Negative.   Musculoskeletal: Positive for arthralgias, gait problem, joint swelling and myalgias.  Allergic/Immunologic:  Negative.   Psychiatric/Behavioral: Negative.     Social History  Substance Use Topics  . Smoking status: Never Smoker  . Smokeless tobacco: Never Used  . Alcohol use No   Objective:   BP (!) 142/72 (BP Location: Right Arm, Patient Position: Sitting, Cuff Size: Normal)   Pulse 68   Temp 98 F (36.7 C)   Resp 16   SpO2 97%  Vitals:   11/16/16 1033  BP: (!) 142/72  Pulse: 68  Resp: 16  Temp: 98 F (36.7 C)  SpO2: 97%     Physical Exam  Constitutional: She is oriented to person, place, and time. She appears well-developed and well-nourished.  HENT:  Head: Normocephalic and atraumatic.  Right Ear: External ear normal.  Left Ear: External ear normal.  Nose: Nose normal.  Eyes: Conjunctivae are normal.  Neck: No thyromegaly present.  Cardiovascular: Normal rate, regular rhythm, normal heart sounds and intact distal pulses.   Pulmonary/Chest: Effort normal and breath sounds normal.  Abdominal: Soft.  Musculoskeletal: Normal range of motion. She exhibits tenderness and deformity. She exhibits no edema.  Has some mild tenderness on the dorsal surface over the 3rd and 4th MTP.  Neurological: She is alert and oriented to person, place, and time.  Skin: Skin is warm and dry. No rash noted. No erythema. No pallor.  Psychiatric: She has a normal mood and affect. Her behavior is normal. Judgment and thought content normal.        Assessment & Plan:     1. Left foot pain Nonspecific. - meloxicam (MOBIC) 7.5 MG tablet; Take 1 tablet (7.5 mg total) by mouth 2 (two) times daily as needed for pain.  Dispense: 60 tablet; Refill: 0 - DG Foot Complete Left; Future 2.s/p CVA      I have done the exam and reviewed the above chart and it is accurate to the best of my knowledge. Dragon  technology has been used in this note in any air is in the dictation or transcription are unintentional.    Jr, MD  [facility]  

## 2016-11-22 ENCOUNTER — Ambulatory Visit (INDEPENDENT_AMBULATORY_CARE_PROVIDER_SITE_OTHER): Payer: PPO | Admitting: Family Medicine

## 2016-11-22 ENCOUNTER — Encounter: Payer: Self-pay | Admitting: Emergency Medicine

## 2016-11-22 ENCOUNTER — Emergency Department: Payer: PPO

## 2016-11-22 ENCOUNTER — Observation Stay
Admission: EM | Admit: 2016-11-22 | Discharge: 2016-11-22 | Disposition: A | Discharge status: Home / Self Care | Payer: PPO | Attending: Internal Medicine | Admitting: Internal Medicine

## 2016-11-22 VITALS — BP 178/76 | HR 74 | Resp 22

## 2016-11-22 DIAGNOSIS — E785 Hyperlipidemia, unspecified: Secondary | ICD-10-CM | POA: Insufficient documentation

## 2016-11-22 DIAGNOSIS — I69393 Ataxia following cerebral infarction: Secondary | ICD-10-CM | POA: Insufficient documentation

## 2016-11-22 DIAGNOSIS — Z6841 Body Mass Index (BMI) 40.0 and over, adult: Secondary | ICD-10-CM | POA: Insufficient documentation

## 2016-11-22 DIAGNOSIS — E039 Hypothyroidism, unspecified: Secondary | ICD-10-CM | POA: Insufficient documentation

## 2016-11-22 DIAGNOSIS — E11319 Type 2 diabetes mellitus with unspecified diabetic retinopathy without macular edema: Secondary | ICD-10-CM | POA: Diagnosis not present

## 2016-11-22 DIAGNOSIS — K589 Irritable bowel syndrome without diarrhea: Secondary | ICD-10-CM | POA: Insufficient documentation

## 2016-11-22 DIAGNOSIS — R079 Chest pain, unspecified: Secondary | ICD-10-CM | POA: Diagnosis not present

## 2016-11-22 DIAGNOSIS — E1151 Type 2 diabetes mellitus with diabetic peripheral angiopathy without gangrene: Secondary | ICD-10-CM | POA: Insufficient documentation

## 2016-11-22 DIAGNOSIS — Z8614 Personal history of Methicillin resistant Staphylococcus aureus infection: Secondary | ICD-10-CM | POA: Diagnosis not present

## 2016-11-22 DIAGNOSIS — K219 Gastro-esophageal reflux disease without esophagitis: Secondary | ICD-10-CM | POA: Insufficient documentation

## 2016-11-22 DIAGNOSIS — E1149 Type 2 diabetes mellitus with other diabetic neurological complication: Secondary | ICD-10-CM | POA: Insufficient documentation

## 2016-11-22 DIAGNOSIS — I10 Essential (primary) hypertension: Secondary | ICD-10-CM | POA: Insufficient documentation

## 2016-11-22 LAB — BASIC METABOLIC PANEL
Anion gap: 10 (ref 5–15)
BUN: 24 mg/dL — AB (ref 6–20)
CALCIUM: 9 mg/dL (ref 8.9–10.3)
CO2: 24 mmol/L (ref 22–32)
CREATININE: 1.25 mg/dL — AB (ref 0.44–1.00)
Chloride: 103 mmol/L (ref 101–111)
GFR calc Af Amer: 53 mL/min — ABNORMAL LOW (ref >60)
GFR, EST NON AFRICAN AMERICAN: 46 mL/min — AB (ref >60)
GLUCOSE: 158 mg/dL — AB (ref 65–99)
Potassium: 4.2 mmol/L (ref 3.5–5.1)
Sodium: 137 mmol/L (ref 135–145)

## 2016-11-22 LAB — CBC
HCT: 37.7 % (ref 35.0–47.0)
Hemoglobin: 12.7 g/dL (ref 12.0–16.0)
MCH: 28.4 pg (ref 26.0–34.0)
MCHC: 33.7 g/dL (ref 32.0–36.0)
MCV: 84.4 fL (ref 80.0–100.0)
Platelets: 221 10*3/uL (ref 150–440)
RBC: 4.46 MIL/uL (ref 3.80–5.20)
RDW: 15.6 % — AB (ref 11.5–14.5)
WBC: 8.6 10*3/uL (ref 3.6–11.0)

## 2016-11-22 LAB — TROPONIN I
Troponin I: <0.03 ng/mL (ref <0.03)
Troponin I: <0.03 ng/mL (ref <0.03)

## 2016-11-22 MED ORDER — HYDROMORPHONE HCL 1 MG/ML IJ SOLN
0.5000 mg | Freq: Once | INTRAMUSCULAR | Status: DC
  Filled 2016-11-22: qty 1

## 2016-11-22 MED ORDER — ASPIRIN 81 MG PO CHEW
243.0000 mg | CHEWABLE_TABLET | Freq: Once | ORAL | Status: AC
  Administered 2016-11-22: 243 mg via ORAL

## 2016-11-22 MED ORDER — NITROGLYCERIN 2 % TD OINT
1.0000 [in_us] | TOPICAL_OINTMENT | Freq: Once | TRANSDERMAL | Status: AC
  Administered 2016-11-22: 1 [in_us] via TOPICAL
  Filled 2016-11-22: qty 1

## 2016-11-22 MED ORDER — ASPIRIN 81 MG PO CHEW
324.0000 mg | CHEWABLE_TABLET | Freq: Once | ORAL | Status: DC
  Filled 2016-11-22: qty 4

## 2016-11-22 NOTE — ED Triage Notes (Signed)
Pt in via EMS, sent over from [facility]; pt reports centralized chest pain starting last night radiating through to back and left neck w/ associated dizziness/light headedness.  Pt took 81mg  aspirin at home, given 1 nitro without relief at PCP.  Pt appears uncomfortable.  MD notified.

## 2016-11-22 NOTE — ED Notes (Signed)
Patient transported to X-ray 
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
## 2016-11-22 NOTE — ED Notes (Signed)
MD to bedside to speak with patient prior to discharge; pt agrees to be admitted to hospital at this time.
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
## 2016-11-23 ENCOUNTER — Encounter: Payer: Self-pay | Admitting: Family Medicine

## 2016-11-23 ENCOUNTER — Ambulatory Visit (INDEPENDENT_AMBULATORY_CARE_PROVIDER_SITE_OTHER): Payer: PPO | Admitting: Family Medicine

## 2016-11-23 VITALS — BP 164/70 | HR 72 | Temp 97.7°F | Resp 20

## 2016-11-23 DIAGNOSIS — M546 Pain in thoracic spine: Secondary | ICD-10-CM

## 2016-11-23 MED ORDER — PREDNISONE 10 MG (21) PO TBPK: ORAL_TABLET | ORAL | 0 refills | Status: DC

## 2016-11-23 MED ORDER — KETOROLAC TROMETHAMINE 60 MG/2ML IM SOLN
60.0000 mg | Freq: Once | INTRAMUSCULAR | Status: AC
  Administered 2016-11-23: 60 mg via INTRAMUSCULAR

## 2016-11-23 NOTE — Patient Instructions (Addendum)
Stop Meloxicam until follow up next week.

## 2016-11-23 NOTE — Progress Notes (Signed)
Patient: Susan House Female    DOB: Dec 29, 1955   61 y.o.   MRN: [id number] Visit Date: 11/23/2016  Today's Provider: Wilhemena Durie, MD   Chief Complaint  Patient presents with  . Hospitalization Follow-up   Subjective:    HPI  Follow Up ER Visit  Patient is here for ER follow up.  She was recently seen at [facility] for chest pain on 11/22/2016. Treatment for this included labs, chest xray, and aspirin.  She reports this condition is Unchanged.  Patient reports that she is still having the chest pain that radiates to her back. She states, "They did nothing for me at the hospital." She is requesting something to help resolve the pain. Per ER note, it was recommended that the patient was to stay for further work up, but she declined.         Allergies  Allergen Reactions  . Morphine And Related Anaphylaxis  . Fentanyl Other (See Comments)    Unknown reaction.  . Reglan [Metoclopramide]     Elevated BP  . Simvastatin Other (See Comments)    Muscle pain  . Betadine [Povidone Iodine] Rash  . Iodine Rash  . Tetracyclines & Related Rash     Current Outpatient Prescriptions:  .  ACCU-CHEK AVIVA PLUS test strip, TEST BLOOD SUGAR THREE TIMES DAILY, Disp: 300 each, Rfl: 3 .  ACCU-CHEK SOFTCLIX LANCETS lancets, CHECK BLOOD SUGAR THREE TIMES DAILY, Disp: 300 each, Rfl: 3 .  amLODipine (NORVASC) 10 MG tablet, Take 1 tablet (10 mg total) by mouth daily., Disp: 90 tablet, Rfl: 3 .  aspirin EC 81 MG tablet, Take 81 mg by mouth daily., Disp: , Rfl:  .  atorvastatin (LIPITOR) 40 MG tablet, Take 0.5 tablets (20 mg total) by mouth at bedtime., Disp: 90 tablet, Rfl: 3 .  baclofen (LIORESAL) 20 MG tablet, Take 1 tablet by mouth 2 (two) times daily as needed. , Disp: , Rfl:  .  Blood Glucose Monitoring Suppl (ACCU-CHEK AVIVA PLUS) w/Device KIT, Check sugar three times daily DX E11.9-needs a meter, Disp: 1 kit, Rfl: 0 .  Cranberry 1000 MG CAPS, Take 2,000 mg by mouth daily. ,  Disp: , Rfl:  .  cyclobenzaprine (FLEXERIL) 10 MG tablet, Take 1 tablet (10 mg total) by mouth 3 (three) times daily as needed for muscle spasms., Disp: 30 tablet, Rfl: 0 .  diphenoxylate-atropine (LOMOTIL) 2.5-0.025 MG tablet, 2 tablets initially and then 1 tablet every 6 hours as needed for diarrhea, Disp: 60 tablet, Rfl: 1 .  Doxepin HCl (ZONALON) 5 % CREA, Apply 1 application topically 4 (four) times daily., Disp: 45 g, Rfl: 2 .  enalapril (VASOTEC) 20 MG tablet, Take 1 tablet (20 mg total) by mouth 2 (two) times daily., Disp: 180 tablet, Rfl: 0 .  gabapentin (NEURONTIN) 100 MG capsule, Take 1 capsule (100 mg total) by mouth 3 (three) times daily., Disp: 270 capsule, Rfl: 3 .  HYDROcodone-acetaminophen (NORCO) 10-325 MG tablet, , Disp: , Rfl:  .  insulin aspart protamine- aspart (NOVOLOG MIX 70/30) (70-30) 100 UNIT/ML injection, Inject 25-30 Units into the skin 2 (two) times daily. , Disp: , Rfl:  .  Lancet Devices (ACCU-CHEK SOFTCLIX) lancets, Check sugar three times daily DX E11.9, Disp: 100 each, Rfl: 3 .  levETIRAcetam (KEPPRA) 500 MG tablet, Take 1 tablet (500 mg total) by mouth 2 (two) times daily., Disp: 60 tablet, Rfl: 2 .  magnesium oxide (MAG-OX) 400 (241.3 Mg) MG tablet,  Take 1 tablet (400 mg total) by mouth daily., Disp: 90 tablet, Rfl: 3 .  meloxicam (MOBIC) 7.5 MG tablet, Take 1 tablet (7.5 mg total) by mouth 2 (two) times daily as needed for pain., Disp: 60 tablet, Rfl: 0 .  metFORMIN (GLUCOPHAGE) 1000 MG tablet, Take 1 tablet (1,000 mg total) by mouth 2 (two) times daily., Disp: 180 tablet, Rfl: 3 .  metoprolol succinate (TOPROL-XL) 100 MG 24 hr tablet, Take 1 tablet (100 mg total) by mouth daily. Take with or immediately following a meal., Disp: 30 tablet, Rfl: 11 .  nitroGLYCERIN (NITROSTAT) 0.4 MG SL tablet, Place 1 tablet (0.4 mg total) under the tongue every 5 (five) minutes as needed for chest pain., Disp: 25 tablet, Rfl: 0 .  oxybutynin (DITROPAN) 5 MG tablet, Take 1 tablet  (5 mg total) by mouth 3 (three) times daily., Disp: 270 tablet, Rfl: 3 .  pantoprazole (PROTONIX) 40 MG tablet, Take 1 tablet (40 mg total) by mouth daily., Disp: 90 tablet, Rfl: 3  Review of Systems  Constitutional: Negative.   Eyes: Negative.   Respiratory: Positive for chest tightness and shortness of breath.   Cardiovascular: Positive for chest pain. Negative for palpitations and leg swelling.  Endocrine: Negative.   Musculoskeletal: Positive for back pain, gait problem and myalgias.       Pt in wheelchair  Allergic/Immunologic: Negative.   Neurological: Negative for dizziness, syncope, weakness, numbness and headaches.  Psychiatric/Behavioral: Negative.     Social History  Substance Use Topics  . Smoking status: Never Smoker  . Smokeless tobacco: Never Used  . Alcohol use No   Objective:   BP (!) 164/70 (BP Location: Right Arm, Patient Position: Sitting, Cuff Size: Normal)   Pulse 72   Temp 97.7 F (36.5 C)   Resp 20   SpO2 99%  Vitals:   11/23/16 1103  BP: (!) 164/70  Pulse: 72  Resp: 20  Temp: 97.7 F (36.5 C)  SpO2: 99%     Physical Exam  Constitutional: She appears well-developed and well-nourished.  HENT:  Head: Normocephalic and atraumatic.  Right Ear: External ear normal.  Left Ear: External ear normal.  Nose: Nose normal.  Eyes: Conjunctivae are normal.  Pulmonary/Chest: Effort normal and breath sounds normal.  Patient has tenderness through her upper back (scapula an up) bilaterally.   Abdominal: Soft.  Musculoskeletal: She exhibits tenderness.  Skin: Skin is warm and dry.  Psychiatric: She has a normal mood and affect. Her behavior is normal. Judgment and thought content normal.        Assessment & Plan:     1. Acute bilateral thoracic back pain Uncertain etiology. Will treat as below. Symptoms appear to be muscular. Stop meloxicam and treat as below. F/U in 4-5 days. Keep appt with cardiology.  - ketorolac (TORADOL) injection 60 mg; Inject  2 mLs (60 mg total) into the muscle once. - predniSONE (STERAPRED UNI-PAK 21 TAB) 10 MG (21) TBPK tablet; Take prednisone pak and taper as directed.  Dispense: 21 tablet; Refill: 0 2.TIIDM 3.h/o CVA      I have done the exam and reviewed the above chart and it is accurate to the best of my knowledge. Development worker, community has been used in this note in any air is in the dictation or transcription are unintentional.  Wilhemena Durie, MD  [place]

## 2016-11-24 ENCOUNTER — Telehealth: Payer: Self-pay

## 2016-11-24 ENCOUNTER — Telehealth: Payer: Self-pay | Admitting: Cardiovascular Disease

## 2016-11-24 NOTE — Telephone Encounter (Signed)
Pt calling stating she was having chest pains and is still having those pains  Pt c/o of Chest Pain: STAT if CP now or developed within 24 hours  1. Are you having CP right now? Yes   2. Are you experiencing any other symptoms (ex. SOB, nausea, vomiting, sweating)?  Just back pain, nausea   3. How long have you been experiencing CP? Mid night last night  4. Is your CP continuous or coming and going? On going (now)  5. Have you taken Nitroglycerin? Only took some pain killers to see if that would help the pains, for some back pain she was having  ?

## 2016-11-24 NOTE — Telephone Encounter (Signed)
Spoke w/ pt.  She reports that she is having severe, crushing chest pain that is radiating to her back and b/t her shoulder blades. She reports that she pain has been increasing and she is c/o SOB. Pt's husband is home w/ her now.  Advised her to have him take her to the ED or call 911. She verbalizes understanding, but asks if she can come to our office. Advised her that there is no MD in the office right now. She will proceed to the ED.

## 2016-11-24 NOTE — Telephone Encounter (Signed)
Patient called and states she is having worsening pain of her back through her chest pain like the day she came in and we sent her to ER by EMS. Patient was seen yesterday but is hurting worse again. I spoke with Dr Rosanna Randy and his advice was to go back to ER. Patient states when she went to ER earlier this week she did not leave on her own they released her otherwise she would have stayed.-aa

## 2016-11-27 ENCOUNTER — Ambulatory Visit (INDEPENDENT_AMBULATORY_CARE_PROVIDER_SITE_OTHER): Payer: PPO | Admitting: Neurology

## 2016-11-27 ENCOUNTER — Encounter: Payer: Self-pay | Admitting: Neurology

## 2016-11-27 VITALS — BP 159/77 | HR 77

## 2016-11-27 DIAGNOSIS — N183 Chronic kidney disease, stage 3 unspecified: Secondary | ICD-10-CM | POA: Insufficient documentation

## 2016-11-27 DIAGNOSIS — I69398 Other sequelae of cerebral infarction: Secondary | ICD-10-CM

## 2016-11-27 DIAGNOSIS — E669 Obesity, unspecified: Secondary | ICD-10-CM

## 2016-11-27 DIAGNOSIS — R569 Unspecified convulsions: Secondary | ICD-10-CM

## 2016-11-27 DIAGNOSIS — R269 Unspecified abnormalities of gait and mobility: Secondary | ICD-10-CM | POA: Diagnosis not present

## 2016-11-27 DIAGNOSIS — E0822 Diabetes mellitus due to underlying condition with diabetic chronic kidney disease: Secondary | ICD-10-CM | POA: Diagnosis not present

## 2016-11-27 MED ORDER — LEVETIRACETAM 500 MG PO TABS: ORAL_TABLET | ORAL | 5 refills | Status: DC

## 2016-11-27 MED ORDER — ASPIRIN 81 MG PO TBEC: 81.0000 mg | DELAYED_RELEASE_TABLET | Freq: Every day | ORAL | 12 refills | Status: DC

## 2016-11-27 NOTE — Progress Notes (Signed)
PATIENT: Susan House DOB: 07/04/1956  REASON FOR VISIT: follow up- stroke, restless leg syndrome, left hemiparesis HISTORY FROM: patient  HISTORY OF PRESENT ILLNESS:   Today is 11/27/2016 and I have the pleasure of seeing Susan House in the presence of her husband, daughter and granddaughter. Susan House suffered a possible seizure episode on 10/15/2016. Susan House is a daughter described which she had witnessed a day; the patient had fallen out of bed and onto a stool that was placed next to the bed she had very limited recollection of what happened and her husband called to the emergency room services. Her daughter met her at the hospital and found her mother still very confused she had speech arrest. She tried to communicate by gesticulation. Was pantng, seemed in discomfort. VS were Ok. Patient remained restless and erratic, needing to nurses to observe her and basically help prevent injuries. Then within an hour or so the patient became suddenly fully aware of her surroundings. The patient had received nitroglycerin for chest pain, she had some aspirin after her stroke had been ruled out, nausea medication, but she was not given a seizure medicine or sedation.  The EEG during hospitalization was read by Dr. Alexis Goodell and described a drowsy rhythm with rare left temporal sharp transients and phase reversal at the third temporal lead. Photic stimulation did not show any changes, hyperventilation was deferred. The finding was suggestive of a focal disturbance with epileptogenic potential. The patient was treated by Dr. Tressia Miners, and the discharge diagnosis on April 3 with acute encephalopathy with a new onset seizure, abnormal EEG, started on Keppra 500 mg twice a day to follow-up with neurology outpatient, no driving. She has taking it and feels slowed, thought process delay. Her family feels she is snappy and angry since starting the medication.  She takes narco for back pain.  Other medications were baclofen, Lipitor, Flexeril, Lomotil, enalapril, gabapentin, magnesium oxide insulin did troponin, Protonix, nitroglycerin, Toprol. Plavix was discontinued after she had a GI bleed, colonoscopy confirmed ulceration 06-2016 . May re start after 6 month.  MM- 2017 [name] is a 61 year old female with a history of stroke with subsequent left hemiparesis. Her stroke occurred in 2000. She reports that she has continued on aspirin and Plavix. She is tolerating these medications well. Her blood pressure is slightly elevated today. Her primary care is managing her blood pressure medications. Reports that her last hemoglobin A1c was 8.4. She is currently on Lipitor for her cholesterol. She denies any additional strokelike symptoms. She states that she does have difficulty walking long distances and for that reason she will use a wheelchair. She has very limited movement of the left arm. She has found baclofen to be beneficial for spasms. She also has a history of restless leg symptoms. Her primary care tried her on Requip however she did not find it beneficial. She is currently on gabapentin not specifically for restless leg symptoms. She states that her symptoms are tolerable but does at times interfere with her sleep. She returns today for an evaluation.  HISTORY (Dr. Edwena Felty note): Susan House is a 61 year old Caucasian female patient with significant left hemiparetic residual and left homonymous hemianopsia after her stroke she suffered in 2000. She has continued to take Plavix and aspirin and her risk factors have not changed significantly since our last visit. She has a history of his hypertension, migraine headaches, gastroesophageal reflux, diabetes, hyperlipidemia, obesity, irritable bowel syndrome, urinary incontinence, and was last admitted  with a UTI to [facility] in [place]. Admission date was 12/26/2015 under the guidance of Dr. Fulton Reek. Primary doctor is Miguel Aschoff Junior discharge date was 12/28/2015. The patient was diagnosed with altered mental status due to urosepsis. She was treated with 3 antibiotics. Upon discharge the patient was told that she did not have an infection? She was diagnosed with systemic inflammatory response syndrome and encephalopathy. A CT of the head was obtained on the 11th of June, it showed no acute intracranial abnormalities, an MRI of the brain with and without contrast followed on 12/28/2015."   It appeared stable in comparison to a brain image from 2015 with chronic  deep left intracranial scoliosis, postoperative changes to both globes after cataract surgery, some minor nasal sinus mucosal thickening. 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 She was at least 8 hours out on a very hot and sunny day working outside not sure that she hydrated properly developing a terrific headache she cannot remember what happened that night from Saturday to Sunday and her husband reports that she was not able to sleep for a couple of nights. He accompanied her to the hospital. It is likely that she had a heat related accident. She missed a couple of medicine doses as well.    REVIEW OF SYSTEMS: Out of a complete 14 system review of symptoms, the patient complains only of the following symptoms, and all other reviewed systems are negative  ALLERGIES: Allergies  Allergen Reactions  . Morphine And Related Anaphylaxis  . Fentanyl Other (See Comments)    Unknown reaction.  . Reglan [Metoclopramide]     Elevated BP  . Simvastatin Other (See Comments)    Muscle pain  . Betadine [Povidone Iodine] Rash  . Iodine Rash  . Tetracyclines & Related Rash    HOME MEDICATIONS: Outpatient  Medications Prior to Visit  Medication Sig Dispense Refill  . ACCU-CHEK AVIVA PLUS test strip TEST BLOOD SUGAR THREE TIMES DAILY 300 each 3  . ACCU-CHEK SOFTCLIX LANCETS lancets CHECK BLOOD SUGAR THREE TIMES DAILY 300 each 3  . amLODipine (NORVASC) 10 MG tablet Take 1 tablet (10 mg total) by mouth daily. 90 tablet 3  . aspirin EC 81 MG tablet Take 81 mg by mouth daily.    Marland Kitchen atorvastatin (LIPITOR) 40 MG tablet Take 0.5 tablets (20 mg total) by mouth at bedtime. 90 tablet 3  . baclofen (LIORESAL) 20 MG tablet Take 1 tablet by mouth 2 (two) times daily as needed.     . Blood Glucose Monitoring Suppl (ACCU-CHEK AVIVA PLUS) w/Device KIT Check sugar three times daily DX E11.9-needs a meter 1 kit 0  . Cranberry 1000 MG CAPS Take 2,000 mg by mouth daily.     . cyclobenzaprine (FLEXERIL) 10 MG tablet Take 1 tablet (10 mg total) by mouth 3 (three) times daily as needed for muscle spasms. 30 tablet 0  . diphenoxylate-atropine (LOMOTIL) 2.5-0.025 MG tablet 2 tablets initially and then 1 tablet every 6 hours as needed for diarrhea 60 tablet 1  . enalapril (VASOTEC) 20 MG tablet Take 1 tablet (20 mg total) by mouth 2 (two) times daily. 180 tablet 0  . gabapentin (NEURONTIN) 100 MG capsule Take 1 capsule (100 mg total) by mouth 3 (three) times  daily. 270 capsule 3  . HYDROcodone-acetaminophen (NORCO) 10-325 MG tablet     . insulin aspart protamine- aspart (NOVOLOG MIX 70/30) (70-30) 100 UNIT/ML injection Inject 25-30 Units into the skin 2 (two) times daily.     Elmore Guise Devices (ACCU-CHEK SOFTCLIX) lancets Check sugar three times daily DX E11.9 100 each 3  . levETIRAcetam (KEPPRA) 500 MG tablet Take 1 tablet (500 mg total) by mouth 2 (two) times daily. 60 tablet 2  . metFORMIN (GLUCOPHAGE) 1000 MG tablet Take 1 tablet (1,000 mg total) by mouth 2 (two) times daily. 180 tablet 3  . metoprolol succinate (TOPROL-XL) 100 MG 24 hr tablet Take 1 tablet (100 mg total) by mouth daily. Take with or immediately following a  meal. 30 tablet 11  . nitroGLYCERIN (NITROSTAT) 0.4 MG SL tablet Place 1 tablet (0.4 mg total) under the tongue every 5 (five) minutes as needed for chest pain. 25 tablet 0  . oxybutynin (DITROPAN) 5 MG tablet Take 1 tablet (5 mg total) by mouth 3 (three) times daily. 270 tablet 3  . pantoprazole (PROTONIX) 40 MG tablet Take 1 tablet (40 mg total) by mouth daily. 90 tablet 3  . predniSONE (STERAPRED UNI-PAK 21 TAB) 10 MG (21) TBPK tablet Take prednisone pak and taper as directed. 21 tablet 0  . magnesium oxide (MAG-OX) 400 (241.3 Mg) MG tablet Take 1 tablet (400 mg total) by mouth daily. (Patient not taking: Reported on 11/27/2016) 90 tablet 3  . meloxicam (MOBIC) 7.5 MG tablet Take 1 tablet (7.5 mg total) by mouth 2 (two) times daily as needed for pain. (Patient not taking: Reported on 11/27/2016) 60 tablet 0  . Doxepin HCl (ZONALON) 5 % CREA Apply 1 application topically 4 (four) times daily. 45 g 2   No facility-administered medications prior to visit.     PAST MEDICAL HISTORY: Past Medical History:  Diagnosis Date  . Allergy   . Arthritis   . Cellulitis and abscess of face   . Depression   . Diabetes mellitus ([place])   . Edema   . GERD (gastroesophageal reflux disease)   . Hematuria   . History of echocardiogram    2008: normal, 2011: normal LVSF  . History of nuclear stress test    a. 12/2009: lexiscan - negative  . Hyperlipidemia   . Hypertension   . IBS (irritable bowel syndrome)   . Migraine   . Morbid obesity ([place])   . Nocturia   . Stroke ([place]) 2000  . TIA (transient ischemic attack) 2010  . Urgency of micturation   . Urinary frequency   . Urinary incontinence     PAST SURGICAL HISTORY: Past Surgical History:  Procedure Laterality Date  . ABDOMINAL HYSTERECTOMY    . ABDOMINOPLASTY    . APPENDECTOMY    . BACK SURGERY     extensive spinal fusion  . carpel tunnel surgery    . CATARACT EXTRACTION     x 2  . CESAREAN SECTION    . CHOLECYSTECTOMY    . COLONOSCOPY   2010  . COLONOSCOPY WITH PROPOFOL N/A 07/11/2016   Procedure: COLONOSCOPY WITH PROPOFOL;  Surgeon: Lucilla Lame, MD;  Location: [facility] ENDOSCOPY;  Service: Endoscopy;  Laterality: N/A;  . EYE SURGERY    . HERNIA REPAIR    . SHOULDER SURGERY    . TOE SURGERY    . UPPER GI ENDOSCOPY    . WRIST FRACTURE SURGERY Left     FAMILY HISTORY: Family History  Problem Relation Age  of Onset  . Hyperlipidemia Mother   . Arrhythmia Mother        WPW  . Hypertension Mother   . Rheum arthritis Mother   . Heart disease Mother   . Heart attack Father 19  . Hyperlipidemia Father   . Hypertension Father   . Stroke Father   . Diabetes Father   . Heart disease Father   . Coronary artery disease Father   . Diabetes Sister   . Hyperlipidemia Sister   . Hypertension Sister   . Depression Sister   . Depression Sister   . Diabetes Sister   . Hypertension Sister   . Hyperlipidemia Sister   . Kidney disease Paternal Grandmother     SOCIAL HISTORY: Social History   Social History  . Marital status: Married    Spouse name: Marcello Moores   . Number of children: 2  . Years of education: 15   Occupational History  . Disabled    . retired    Social History Main Topics  . Smoking status: Never Smoker  . Smokeless tobacco: Never Used  . Alcohol use No  . Drug use: No  . Sexual activity: No   Other Topics Concern  . Not on file   Social History Narrative   ** Merged History Encounter **       Patient lives at home with husband Marcello Moores.    Patient has 2 children and 2 step children.    Patient has 12+ years of education.    Patient is Disabled.       PHYSICAL EXAM  Vitals:   11/27/16 1124  BP: (!) 159/77  Pulse: 77   There is no height or weight on file to calculate BMI.  Generalized: Well developed, in no acute distress   Neurological examination  Mentation: Alert oriented to time, place, history taking. Follows all commands speech and language fluent, dysarthria.  Cranial nerve :   No change in taste or smell, but less appetite. Pupils were equal round reactive to light.  Extraocular movements were full, visual field were full on confrontational test. Facial sensation and strength were normal. Uvula tongue midline. Head turning and shoulder shrug  were intact . Motor: Limited movement in the left arm particularly in the elbow and hands.  Spasticity over left extremities  Sensory: Sensory testing is intact to soft touch on all 4 extremities however slightly decreased on the left hand.. No evidence of extinction is noted.  Coordination: Cerebellar testing reveals good finger-nose-finger on the right difficulty on the left. Good heel-to-shin bilaterally.  Gait and station: Patient is in a wheelchair today. Reflexes: 2/2     DIAGNOSTIC DATA (LABS, IMAGING, TESTING) - I reviewed patient records, labs, notes, testing and imaging myself where available. MRI HEAD WITHOUT CONTRAST  TECHNIQUE: Multiplanar, multiecho pulse sequences of the brain and surrounding structures were obtained without intravenous contrast.  COMPARISON:  Head CT from yesterday.  Brain MRI 07/08/2016  FINDINGS: Brain: No acute infarction, hemorrhage, hydrocephalus, extra-axial collection or mass lesion.  Remote left basal ganglia and neighboring white matter infarct with ex vacuo dilatation of the left lateral ventricle. There has also been remote low internal capsule lacunar infarct on the right with wallerian degeneration seen into the brainstem. Remote lacunar infarct in the right posterior frontal subcortical white matter. Small remote high posterior right frontal cortex infarct.  Chronic rounded DWI hyperintensity at the level of the posterior sella. No pituitary enlargement or underlying nodule seen on the other sequences  to suggest this is clinically significant.  Mild ventriculomegaly attributed to generalized volume loss.  Vascular: Preserved flow voids  Skull and upper  cervical spine: Negative  Sinuses/Orbits: Bilateral cataract resection.  Otherwise negative.  IMPRESSION: 1. No acute finding or change since 2017. 2. Remote small/medium vessel infarcts as described.   Electronically Signed   By: Monte Fantasia M.D.   On: 10/16/2016 09:48   Orders Requiring a Screening Form   Procedure Order Status Form Status  MR BRAIN WO CONTRAST Completed Completed  Vitals   Height Weight BMI (Calculated)  5' 1"  (1.549 m) 257 lb 3.2 oz (116.7 kg) 48.7     Lab Results  Component Value Date   WBC 8.6 11/22/2016   HGB 12.7 11/22/2016   HCT 37.7 11/22/2016   MCV 84.4 11/22/2016   PLT 221 11/22/2016      Component Value Date/Time   NA 137 11/22/2016 1209   NA 137 07/25/2016 1429   NA 140 06/17/2014 0744   K 4.2 11/22/2016 1209   K 4.1 06/17/2014 0744   CL 103 11/22/2016 1209   CL 105 06/17/2014 0744   CO2 24 11/22/2016 1209   CO2 27 06/17/2014 0744   GLUCOSE 158 (H) 11/22/2016 1209   GLUCOSE 116 (H) 06/17/2014 0744   BUN 24 (H) 11/22/2016 1209   BUN 22 07/25/2016 1429   BUN 35 (H) 06/17/2014 0744   CREATININE 1.25 (H) 11/22/2016 1209   CREATININE 1.29 06/17/2014 0744   CALCIUM 9.0 11/22/2016 1209   CALCIUM 8.6 06/17/2014 0744   PROT 6.7 07/25/2016 1429   PROT 6.5 01/18/2014 0059   ALBUMIN 4.1 07/25/2016 1429   ALBUMIN 3.0 (L) 01/18/2014 0059   AST 23 07/25/2016 1429   AST 21 01/18/2014 0059   ALT 16 07/25/2016 1429   ALT 18 01/18/2014 0059   ALKPHOS 79 07/25/2016 1429   ALKPHOS 97 01/18/2014 0059   BILITOT 0.4 07/25/2016 1429   BILITOT 0.3 01/18/2014 0059   GFRNONAA 46 (L) 11/22/2016 1209   GFRNONAA 45 (L) 06/17/2014 0744   GFRNONAA 47 (L) 01/18/2014 0059   GFRAA 53 (L) 11/22/2016 1209   GFRAA 55 (L) 06/17/2014 0744   GFRAA 55 (L) 01/18/2014 0059   Lab Results  Component Value Date   CHOL 144 10/15/2016   HDL 51 10/15/2016   LDLCALC 69 10/15/2016   TRIG 122 10/15/2016   CHOLHDL 2.8 10/15/2016   Lab Results    Component Value Date   HGBA1C 7.8 (H) 10/15/2016   Lab Results  Component Value Date   VITAMINB12 551 01/15/2009   Lab Results  Component Value Date   TSH 3.370 07/18/2016      ASSESSMENT AND PLAN 61 y.o. year old female  has a past medical history of Allergy; Arthritis; Cellulitis and abscess of face; Depression; Diabetes mellitus ([place]); Edema; GERD (gastroesophageal reflux disease); Hematuria; History of echocardiogram; History of nuclear stress test; Hyperlipidemia; Hypertension; IBS (irritable bowel syndrome); Migraine; Morbid obesity ([place]); Nocturia; Stroke ([place]) (2000); TIA (transient ischemic attack) (2010); Urgency of micturation; Urinary frequency; and Urinary incontinence. here with:  1. History of stroke with enkephalomalacia. New MRI confirmed no new stroke. Not likely that event was TIA, but she should restart on ASA 81 mg. Not plavix .   2. Left hemiparesis post CVA , gait disorder, slowed and haltering speech.  3. Possible seizure ( ? ) with postiktal confusion. Has a higher risk due to old CVA- large. Had multiple spells before.   Needs repeat  EEG> reviewed MRI and CT head with patient.      Larey Seat, MD   11/27/2016, 11:50 AM [facility] [address], [place], [place] [address] [phone]

## 2016-11-27 NOTE — Patient Instructions (Signed)
Place seizure patient instructions here.  [name]. GU 750 mg of Keppra twice a day, return for an EEG here at [facility] neurologic and will follow up with my nurse practitioner within the next 4-6 weeks. I am not surprised that she had an abnormal EEG since she has well-known encephalomalacia related to a stroke in the left hemisphere as well as 2 previous strokes in the right hemisphere. In the past her confusional spells seem to always correlate with dehydration and urinary tract infections this was the first time the could not relate to one of these conditions. We also discussed that her antiplatelet therapy had been discontinued after she developed a lower GI bleed. I would like for her to return after now  6 months of therapy , back  now to a baby aspirin daily which should be enteric-coated.

## 2016-11-27 NOTE — Addendum Note (Signed)
Addended by: Larey Seat on: 11/27/2016 12:39 PM   Modules accepted: Orders

## 2016-11-28 ENCOUNTER — Ambulatory Visit (INDEPENDENT_AMBULATORY_CARE_PROVIDER_SITE_OTHER): Payer: PPO | Admitting: Family Medicine

## 2016-11-28 ENCOUNTER — Encounter: Payer: Self-pay | Admitting: Family Medicine

## 2016-11-28 VITALS — BP 158/70 | HR 80 | Temp 97.4°F | Resp 16

## 2016-11-28 DIAGNOSIS — R079 Chest pain, unspecified: Secondary | ICD-10-CM

## 2016-11-28 DIAGNOSIS — G40909 Epilepsy, unspecified, not intractable, without status epilepticus: Secondary | ICD-10-CM | POA: Diagnosis not present

## 2016-11-28 DIAGNOSIS — M546 Pain in thoracic spine: Secondary | ICD-10-CM | POA: Diagnosis not present

## 2016-11-28 MED ORDER — LEVETIRACETAM 750 MG PO TABS: ORAL_TABLET | ORAL | 0 refills | Status: DC
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
## 2016-11-29 NOTE — Progress Notes (Signed)
Cardiology Office Note  Date:  11/30/2016   ID:  Susan House, Susan House 11/03/1955, MRN [id number]  PCP:  Jerrol Banana., MD   Chief Complaint  Patient presents with  . other    Follow up from [facility] ER; chest pain. Pt. c/o chest pain that radiates through to her back.    HPI:   61 year old female with a history of  stroke in 2000 followed by posterior circulatory TIA in June of 2010,  chronic dizziness in the past secondary to dehydration,  restless leg syndrome,  type 2 diabetes,  previous episodes of chest pain  She has residual left side weakness arm and leg from her stroke Carotid duplex showed 50-69% stenosis in the right internal carotid artery. <50% stenosis  Left who presents for routine follow-up of her stroke, chest pain symptoms.  possible seizure episode on 10/15/2016. Seen by neurology, MRI showing no stroke Question of seizure  Recently seen in the emergency room 11/22/2016 for chest pain radiating through to her back Hospital records reviewed with the patient in detail centralized chest pain that started last night that radiates into her back and into her neck. Patient states the pain started last night and she took several doses of pain medication to relieve her symptoms. She's had dizziness and lightheadedness. She took aspirin and nitroglycerin without relief at the primary care doctor.  CT scan chest 10/15/2016 showing diffuse three-vessel coronary disease  1 weeks of back to chest pain Worse with movement Keppra "messing with her head", causing anxiety  Hospitalization December 2017 for weakness, dehydration, possible TIA, GI bleed, diarrhea Off plavix She noticed Black stool Felt to have Symptomatic anemia due to acute blood loss.  Hemoglobin decreased from 11.9 to 10.1. But up to 10.6 Had colo as an outpatient, was told that she had "ulcerations"  Total chol 144,LDL 69 HBA1C 7.8  EKG personally reviewed by myself on todays visit Shows  normal sinus rhythm with rate 78 bpm no significant ST or T-wave changes  Other past medical history reviewed  In hospital 07/07/16, possible TIA Could not hear in both the ears ,  felt her speech was slurred. Creatinine 1.6, BUN 45 on arrival, takes HCTZ CT scan of the head did not show any acute intracranial abnormality except for chronic white matter changes  Echo: No cardiac source of emboli was identified, EF 60-65%.  MRA HEAD Flow noted within the distal vertebral arteries and basilar artery. significant atherosclerotic changes of the posterior cerebral arteries and superior cerebral arteries greater on the right.  cardiac workup in the past for chest pain including numerous echocardiograms dating back to 2008, cardiac catheterization. Results of the latter is not available at this time chest pain early December 2015. She was seen in the emergency room to summer second with normal workup at that time, cardiac enzymes negative, chest x-ray reviewed showing no abnormalities. Normal EKG She returned to the emergency room with hematuria 07/02/2014 from urinary tract infection. These are becoming more common per the patient  Last echocardiogram July 2015 showing normal ejection fraction She reports having some arrhythmia in early January 2016 in the setting of infection of her toe. Since her toe infection has resolved, she's had no further palpitations. She was treated with Augmentin. No EKG documenting any arrhythmia.  Prior carotid ultrasound 2008 showing mild to moderate carotid arterial disease. Stress test July 2011 showing no ischemia. At that time was having chest pain and had rule out Continues to have chronic  dizziness, typically when she stands Mouth is always dry, she attributes this to the oxybutynin  Lab work Junior 2015 showing total cholesterol 132, LDL 67 EKG November 2014 showing rare PVC   PMH:   has a past medical history of Allergy; Arthritis; Cellulitis and  abscess of face; Depression; Diabetes mellitus ([place]); Edema; GERD (gastroesophageal reflux disease); Hematuria; History of echocardiogram; History of nuclear stress test; Hyperlipidemia; Hypertension; IBS (irritable bowel syndrome); Migraine; Morbid obesity ([place]); Nocturia; Stroke ([place]) (2000); TIA (transient ischemic attack) (2010); Urgency of micturation; Urinary frequency; and Urinary incontinence.  PSH:    Past Surgical History:  Procedure Laterality Date  . ABDOMINAL HYSTERECTOMY    . ABDOMINOPLASTY    . APPENDECTOMY    . BACK SURGERY     extensive spinal fusion  . carpel tunnel surgery    . CATARACT EXTRACTION     x 2  . CESAREAN SECTION    . CHOLECYSTECTOMY    . COLONOSCOPY  2010  . COLONOSCOPY WITH PROPOFOL N/A 07/11/2016   Procedure: COLONOSCOPY WITH PROPOFOL;  Surgeon: Lucilla Lame, MD;  Location: [facility] ENDOSCOPY;  Service: Endoscopy;  Laterality: N/A;  . EYE SURGERY    . HERNIA REPAIR    . SHOULDER SURGERY    . TOE SURGERY    . UPPER GI ENDOSCOPY    . WRIST FRACTURE SURGERY Left     Current Outpatient Prescriptions  Medication Sig Dispense Refill  . ACCU-CHEK AVIVA PLUS test strip TEST BLOOD SUGAR THREE TIMES DAILY 300 each 3  . ACCU-CHEK SOFTCLIX LANCETS lancets CHECK BLOOD SUGAR THREE TIMES DAILY 300 each 3  . aspirin 81 MG EC tablet Take 1 tablet (81 mg total) by mouth daily. Swallow whole. 30 tablet 12  . atorvastatin (LIPITOR) 40 MG tablet Take 0.5 tablets (20 mg total) by mouth at bedtime. 90 tablet 3  . baclofen (LIORESAL) 20 MG tablet Take 1 tablet by mouth 2 (two) times daily as needed.     . Blood Glucose Monitoring Suppl (ACCU-CHEK AVIVA PLUS) w/Device KIT Check sugar three times daily DX E11.9-needs a meter 1 kit 0  . diphenoxylate-atropine (LOMOTIL) 2.5-0.025 MG tablet 2 tablets initially and then 1 tablet every 6 hours as needed for diarrhea 60 tablet 1  . enalapril (VASOTEC) 20 MG tablet Take 1 tablet (20 mg total) by mouth 2 (two) times daily. 180 tablet 0   . HYDROcodone-acetaminophen (NORCO) 10-325 MG tablet     . insulin aspart protamine- aspart (NOVOLOG MIX 70/30) (70-30) 100 UNIT/ML injection Inject 25-30 Units into the skin 2 (two) times daily.     Elmore Guise Devices (ACCU-CHEK SOFTCLIX) lancets Check sugar three times daily DX E11.9 100 each 3  . levETIRAcetam (KEPPRA) 750 MG tablet 750 mg bid. 60 tablet 0  . metFORMIN (GLUCOPHAGE) 1000 MG tablet Take 1 tablet (1,000 mg total) by mouth 2 (two) times daily. 180 tablet 3  . metoprolol succinate (TOPROL-XL) 100 MG 24 hr tablet Take 1 tablet (100 mg total) by mouth daily. Take with or immediately following a meal. 30 tablet 11  . nitroGLYCERIN (NITROSTAT) 0.4 MG SL tablet Place 1 tablet (0.4 mg total) under the tongue every 5 (five) minutes as needed for chest pain. 25 tablet 0  . oxybutynin (DITROPAN) 5 MG tablet Take 1 tablet (5 mg total) by mouth 3 (three) times daily. 270 tablet 3  . pantoprazole (PROTONIX) 40 MG tablet Take 1 tablet (40 mg total) by mouth daily. 90 tablet 3   No current facility-administered  medications for this visit.      Allergies:   Morphine and related; Fentanyl; Reglan [metoclopramide]; Simvastatin; Betadine [povidone iodine]; Iodine; and Tetracyclines & related   Social History:  The patient  reports that she has never smoked. She has never used smokeless tobacco. She reports that she does not drink alcohol or use drugs.   Family History:   family history includes Arrhythmia in her mother; Coronary artery disease in her father; Depression in her sister and sister; Diabetes in her father, sister, and sister; Heart attack (age of onset: 46) in her father; Heart disease in her father and mother; Hyperlipidemia in her father, mother, sister, and sister; Hypertension in her father, mother, sister, and sister; Kidney disease in her paternal grandmother; Rheum arthritis in her mother; Stroke in her father.    Review of Systems: Review of Systems  Constitutional: Positive  for malaise/fatigue.  Respiratory: Negative.   Cardiovascular: Positive for palpitations.       Tachycardia  Gastrointestinal: Negative.   Musculoskeletal: Negative.   Neurological: Positive for weakness.  Psychiatric/Behavioral: Negative.   All other systems reviewed and are negative.    PHYSICAL EXAM: VS:  BP (!) 158/64 (BP Location: Right Arm, Patient Position: Sitting, Cuff Size: Normal)   Pulse 78   Ht 5' 1"  (1.549 m)   Wt 254 lb 8 oz (115.4 kg)   BMI 48.09 kg/m  , BMI Body mass index is 48.09 kg/m.  GEN: Well nourished, well developed, in no acute distress , Obese  HEENT: normal  Neck: no JVD, carotid bruits, or masses Cardiac:Regular rhythm, tachycardia, no murmurs, rubs, or gallops,no edema  Decreased lower extremity pulses  Respiratory:  clear to auscultation bilaterally, normal work of breathing GI: soft, nontender, nondistended, + BS MS: no deformity or atrophy  Skin: warm and dry, no rash Neuro:  Strength and sensation are intact Psych: euthymic mood, full affect    Recent Labs: 07/18/2016: TSH 3.370 07/25/2016: ALT 16 11/22/2016: BUN 24; Creatinine, Ser 1.25; Hemoglobin 12.7; Platelets 221; Potassium 4.2; Sodium 137    Lipid Panel Lab Results  Component Value Date   CHOL 144 10/15/2016   HDL 51 10/15/2016   LDLCALC 69 10/15/2016   TRIG 122 10/15/2016      Wt Readings from Last 3 Encounters:  11/30/16 254 lb 8 oz (115.4 kg)  11/22/16 260 lb (117.9 kg)  10/15/16 257 lb 3.2 oz (116.7 kg)       ASSESSMENT AND PLAN:   Essential hypertension -  Blood pressure mildly elevated on today's visit, recommended she monitor blood pressure at home and call our office if this runs high. Metoprolol dose recently increased. In pain today which could push her blood pressure up  Mixed hyperlipidemia Cholesterol is at goal on the current lipid regimen. No changes to the medications were made.  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    Signed, Esmond Plants, M.D., Ph.D. 11/30/2016  [place], [place]

## 2016-11-30 ENCOUNTER — Ambulatory Visit (INDEPENDENT_AMBULATORY_CARE_PROVIDER_SITE_OTHER): Payer: PPO | Admitting: Cardiovascular Disease

## 2016-11-30 ENCOUNTER — Encounter: Payer: Self-pay | Admitting: Cardiovascular Disease

## 2016-11-30 VITALS — BP 158/64 | HR 78 | Ht 61.0 in | Wt 254.5 lb

## 2016-11-30 DIAGNOSIS — I10 Essential (primary) hypertension: Secondary | ICD-10-CM | POA: Diagnosis not present

## 2016-11-30 DIAGNOSIS — I638 Other cerebral infarction: Secondary | ICD-10-CM | POA: Diagnosis not present

## 2016-11-30 DIAGNOSIS — I6523 Occlusion and stenosis of bilateral carotid arteries: Secondary | ICD-10-CM

## 2016-11-30 DIAGNOSIS — Z794 Long term (current) use of insulin: Secondary | ICD-10-CM | POA: Diagnosis not present

## 2016-11-30 DIAGNOSIS — I6389 Other cerebral infarction: Secondary | ICD-10-CM

## 2016-11-30 DIAGNOSIS — E782 Mixed hyperlipidemia: Secondary | ICD-10-CM

## 2016-11-30 DIAGNOSIS — I739 Peripheral vascular disease, unspecified: Secondary | ICD-10-CM

## 2016-11-30 DIAGNOSIS — E118 Type 2 diabetes mellitus with unspecified complications: Secondary | ICD-10-CM

## 2016-11-30 NOTE — Patient Instructions (Addendum)
Medication Instructions:   No medication changes made  Please monitor your blood pressure If you run high, We could use HCTZ 3x a week   Labwork:  No new labs needed  Testing/Procedures:  No further testing at this time   I recommend watching educational videos on topics of interest to you at:       www.goemmi.com  Enter code: HEARTCARE    Follow-Up: It was a pleasure seeing you in the office today. Please call us if you have new issues that need to be addressed before your next appt.  [phone]  Your physician wants you to follow-up in: 12 months.  You will receive a reminder letter in the mail two months in advance. If you don't receive a letter, please call our office to schedule the follow-up appointment.  If you need a refill on your cardiac medications before your next appointment, please call your pharmacy.

## 2016-12-01 ENCOUNTER — Telehealth: Payer: Self-pay | Admitting: Neurology

## 2016-12-01 DIAGNOSIS — G40909 Epilepsy, unspecified, not intractable, without status epilepticus: Secondary | ICD-10-CM

## 2016-12-01 MED ORDER — LEVETIRACETAM 500 MG PO TABS: 500.0000 mg | ORAL_TABLET | Freq: Two times a day (BID) | ORAL | 5 refills | Status: DC

## 2016-12-01 NOTE — Telephone Encounter (Signed)
I called pt. She reports that she has been taking the keppra 750mg  BID since Tuesday and thinks that he depression is worse. She does not want to wait 4-6 weeks to see the NP to change to lamictal. Pt asked that I inform Dr. Brett Fairy of this.

## 2016-12-01 NOTE — Telephone Encounter (Signed)
Pt calling re: increase in [place], she feels it may have something to do with feelings of depresssion and would like a call back

## 2016-12-01 NOTE — Telephone Encounter (Signed)
I advised the patient to return to 500 mg bid dosing due to depression. She had tolerated this dose well, but may not have sufficient  Seizure protection at this dose.  She understood, explained that she is not driving and not drinking, not operating machinery

## 2016-12-01 NOTE — Addendum Note (Signed)
Addended by: Larey Seat on: 12/01/2016 12:27 PM   Modules accepted: Orders

## 2016-12-05 MED ORDER — LEVETIRACETAM 500 MG PO TABS: 500.0000 mg | ORAL_TABLET | Freq: Two times a day (BID) | ORAL | 2 refills | Status: DC

## 2016-12-05 NOTE — Telephone Encounter (Signed)
I called Tammy back at [facility]. I clarified that the RX for keppra is now 500mg  tablet BID. Tammy is requesting a 90 day supply, otherwise, pt will need to use a local pharmacy. Will speak to Dr.Dohmeier regarding this.

## 2016-12-05 NOTE — Telephone Encounter (Signed)
Tammy with [facility] called in reference to levETIRAcetam (KEPPRA) 500 MG tablet requesting clarification on instructions due to receiving 2 different ways patient to to take the medication.  Also would like to see if patient can have a 90 day supply.  When calling per [place] state laws if having to leave a message with Tammy she does have to have callers first and full last name.  Please call [phone].

## 2016-12-05 NOTE — Telephone Encounter (Signed)
Patient called office returning RN's call patient confirmed [facility] is the pharmacy to use.

## 2016-12-05 NOTE — Telephone Encounter (Signed)
I spoke to Dr. Brett Fairy and she gave me a verbal order for a 90 day supply of pt's keppra 500mg  BID.

## 2016-12-05 NOTE — Telephone Encounter (Signed)
I called pt to discuss. It appears that Dr. Brett Fairy sent a new keppra RX to [facility] on 12/01/2016. I'm not sure if this is the preferred pharmacy for this pt. No answer, left a message asking pt to call me back.

## 2016-12-05 NOTE — Addendum Note (Signed)
Addended by: Lester Linn A on: 12/05/2016 01:29 PM   Modules accepted: Orders

## 2016-12-14 ENCOUNTER — Ambulatory Visit (INDEPENDENT_AMBULATORY_CARE_PROVIDER_SITE_OTHER): Payer: PPO | Admitting: Diagnostic Neuroimaging

## 2016-12-14 DIAGNOSIS — R569 Unspecified convulsions: Secondary | ICD-10-CM | POA: Diagnosis not present

## 2016-12-14 DIAGNOSIS — N183 Chronic kidney disease, stage 3 unspecified: Secondary | ICD-10-CM

## 2016-12-14 DIAGNOSIS — R269 Unspecified abnormalities of gait and mobility: Principal | ICD-10-CM

## 2016-12-14 DIAGNOSIS — E0822 Diabetes mellitus due to underlying condition with diabetic chronic kidney disease: Secondary | ICD-10-CM

## 2016-12-14 DIAGNOSIS — I69398 Other sequelae of cerebral infarction: Secondary | ICD-10-CM

## 2016-12-18 ENCOUNTER — Other Ambulatory Visit: Payer: Self-pay

## 2016-12-18 DIAGNOSIS — G40909 Epilepsy, unspecified, not intractable, without status epilepticus: Secondary | ICD-10-CM

## 2016-12-18 DIAGNOSIS — I10 Essential (primary) hypertension: Secondary | ICD-10-CM

## 2016-12-18 MED ORDER — METOPROLOL SUCCINATE ER 100 MG PO TB24: 100.0000 mg | ORAL_TABLET | Freq: Every day | ORAL | 3 refills | Status: DC

## 2016-12-18 MED ORDER — LEVETIRACETAM 500 MG PO TABS: 500.0000 mg | ORAL_TABLET | Freq: Two times a day (BID) | ORAL | 3 refills | Status: DC

## 2016-12-20 ENCOUNTER — Telehealth: Payer: Self-pay | Admitting: Family Medicine

## 2016-12-20 ENCOUNTER — Ambulatory Visit (INDEPENDENT_AMBULATORY_CARE_PROVIDER_SITE_OTHER): Payer: PPO | Admitting: Physician Assistant

## 2016-12-20 ENCOUNTER — Encounter: Payer: Self-pay | Admitting: Physician Assistant

## 2016-12-20 VITALS — BP 162/70 | HR 68 | Temp 97.7°F | Resp 16

## 2016-12-20 DIAGNOSIS — L309 Dermatitis, unspecified: Secondary | ICD-10-CM | POA: Diagnosis not present

## 2016-12-20 DIAGNOSIS — H60502 Unspecified acute noninfective otitis externa, left ear: Secondary | ICD-10-CM

## 2016-12-20 MED ORDER — TRIAMCINOLONE ACETONIDE 0.1 % EX CREA: TOPICAL_CREAM | CUTANEOUS | 0 refills | Status: DC

## 2016-12-20 MED ORDER — NEOMYCIN-COLIST-HC-THONZONIUM 3.3-3-10-0.5 MG/ML OT SUSP: 4.0000 [drp] | Freq: Three times a day (TID) | OTIC | 0 refills | Status: DC

## 2016-12-20 NOTE — Telephone Encounter (Signed)
Please review. Teasia Zapf Drozdowski, CMA  

## 2016-12-20 NOTE — Patient Instructions (Signed)
Otitis Externa Otitis externa is an infection of the outer ear canal. The outer ear canal is the area between the outside of the ear and the eardrum. Otitis externa is sometimes called "swimmer's ear." What are the causes? This condition may be caused by:  Swimming in dirty water.  Moisture in the ear.  An injury to the inside of the ear.  An object stuck in the ear.  A cut or scrape on the outside of the ear.  What increases the risk? This condition is more likely to develop in swimmers. What are the signs or symptoms? The first symptom of this condition is often itching in the ear. Later signs and symptoms include:  Swelling of the ear.  Redness in the ear.  Ear pain. The pain may get worse when you pull on your ear.  Pus coming from the ear.  How is this diagnosed? This condition may be diagnosed by examining the ear and testing fluid from the ear for bacteria and funguses. How is this treated? This condition may be treated with:  Antibiotic ear drops. These are often given for 10-14 days.  Medicine to reduce itching and swelling.  Follow these instructions at home:  If you were prescribed antibiotic ear drops, apply them as told by your health care provider. Do not stop using the antibiotic even if your condition improves.  Take over-the-counter and prescription medicines only as told by your health care provider.  Keep all follow-up visits as told by your health care provider. This is important. How is this prevented?  Keep your ear dry. Use the corner of a towel to dry your ear after you swim or bathe.  Avoid scratching or putting things in your ear. Doing these things can damage the ear canal or remove the protective wax that lines it, which makes it easier for bacteria and funguses to grow.  Avoid swimming in lakes, polluted water, or pools that may not have the right amount of chlorine.  Consider making ear drops and putting 3 or 4 drops in each ear after  you swim. Ask your health care provider about how you can make ear drops. Contact a health care provider if:  You have a fever.  After 3 days your ear is still red, swollen, painful, or draining pus.  Your redness, swelling, or pain gets worse.  You have a severe headache.  You have redness, swelling, pain, or tenderness in the area behind your ear. This information is not intended to replace advice given to you by your health care provider. Make sure you discuss any questions you have with your health care provider. Document Released: 07/03/2005 Document Revised: 08/10/2015 Document Reviewed: 04/12/2015 Elsevier Interactive Patient Education  2018 [employer].  

## 2016-12-20 NOTE — Telephone Encounter (Signed)
Pt states the Rx for neomycin-colistin-hydrocortisone-thonzonium (CORTISPORIN-TC) 3.09-16-08-0.5 MG/ML OTIC suspension is going to be over $200.00.  Pt is asking if she can get something else that would be cheaper.  Hyman Hopes.  CB#[phone]/MW

## 2016-12-20 NOTE — Progress Notes (Signed)
Patient: Susan House Female    DOB: Jul 17, 1956   61 y.o.   MRN: [id number] Visit Date: 12/20/2016  Today's Provider: Trinna Post, PA-C   Chief Complaint  Patient presents with  . Otalgia  . Rash   Subjective:    Otalgia   There is pain in the left ear. This is a new problem. The current episode started today (Pt states that water got in her ear yesterday, and the pain started today.). The problem has been unchanged. There has been no fever. The pain is at a severity of 5/10. The pain is moderate. Associated symptoms include a rash. Pertinent negatives include no abdominal pain, coughing, diarrhea, ear discharge, headaches, hearing loss (unrelated), neck pain, rhinorrhea, sore throat or vomiting. Treatments tried: Norco. The treatment provided no relief.   Rash Pt has a H/O Eczema. The rash affects the left side of her neck, her right wrist, and right leg. She is c/o worsening pruritis. She is asking for treatment of the pruritis.    Allergies  Allergen Reactions  . Morphine And Related Anaphylaxis  . Fentanyl Other (See Comments)    Unknown reaction.  . Reglan [Metoclopramide]     Elevated BP  . Simvastatin Other (See Comments)    Muscle pain  . Betadine [Povidone Iodine] Rash  . Iodine Rash  . Tetracyclines & Related Rash     Current Outpatient Prescriptions:  .  ACCU-CHEK AVIVA PLUS test strip, TEST BLOOD SUGAR THREE TIMES DAILY, Disp: 300 each, Rfl: 3 .  ACCU-CHEK SOFTCLIX LANCETS lancets, CHECK BLOOD SUGAR THREE TIMES DAILY, Disp: 300 each, Rfl: 3 .  aspirin 81 MG EC tablet, Take 1 tablet (81 mg total) by mouth daily. Swallow whole., Disp: 30 tablet, Rfl: 12 .  atorvastatin (LIPITOR) 40 MG tablet, Take 0.5 tablets (20 mg total) by mouth at bedtime., Disp: 90 tablet, Rfl: 3 .  Blood Glucose Monitoring Suppl (ACCU-CHEK AVIVA PLUS) w/Device KIT, Check sugar three times daily DX E11.9-needs a meter, Disp: 1 kit, Rfl: 0 .  enalapril (VASOTEC) 20 MG tablet,  Take 1 tablet (20 mg total) by mouth 2 (two) times daily., Disp: 180 tablet, Rfl: 0 .  HYDROcodone-acetaminophen (NORCO) 10-325 MG tablet, , Disp: , Rfl:  .  insulin aspart protamine- aspart (NOVOLOG MIX 70/30) (70-30) 100 UNIT/ML injection, Inject 25-30 Units into the skin 2 (two) times daily. , Disp: , Rfl:  .  Lancet Devices (ACCU-CHEK SOFTCLIX) lancets, Check sugar three times daily DX E11.9, Disp: 100 each, Rfl: 3 .  levETIRAcetam (KEPPRA) 500 MG tablet, Take 1 tablet (500 mg total) by mouth 2 (two) times daily. 500 mg bid., Disp: 180 tablet, Rfl: 3 .  metFORMIN (GLUCOPHAGE) 1000 MG tablet, Take 1 tablet (1,000 mg total) by mouth 2 (two) times daily., Disp: 180 tablet, Rfl: 3 .  metoprolol succinate (TOPROL-XL) 100 MG 24 hr tablet, Take 1 tablet (100 mg total) by mouth daily., Disp: 90 tablet, Rfl: 3 .  nitroGLYCERIN (NITROSTAT) 0.4 MG SL tablet, Place 1 tablet (0.4 mg total) under the tongue every 5 (five) minutes as needed for chest pain., Disp: 25 tablet, Rfl: 0 .  oxybutynin (DITROPAN) 5 MG tablet, Take 1 tablet (5 mg total) by mouth 3 (three) times daily., Disp: 270 tablet, Rfl: 3 .  pantoprazole (PROTONIX) 40 MG tablet, Take 1 tablet (40 mg total) by mouth daily., Disp: 90 tablet, Rfl: 3 .  baclofen (LIORESAL) 20 MG tablet, Take 1 tablet by  mouth 2 (two) times daily as needed. , Disp: , Rfl:  .  diphenoxylate-atropine (LOMOTIL) 2.5-0.025 MG tablet, 2 tablets initially and then 1 tablet every 6 hours as needed for diarrhea (Patient not taking: Reported on 12/20/2016), Disp: 60 tablet, Rfl: 1  Review of Systems  HENT: Positive for ear pain. Negative for ear discharge, hearing loss (unrelated), rhinorrhea and sore throat.   Respiratory: Negative for cough.   Gastrointestinal: Negative for abdominal pain, diarrhea and vomiting.  Musculoskeletal: Negative for neck pain.  Skin: Positive for rash.  Neurological: Negative for headaches.    Social History  Substance Use Topics  . Smoking  status: Never Smoker  . Smokeless tobacco: Never Used  . Alcohol use No   Objective:   BP (!) 162/70 (BP Location: Right Arm, Patient Position: Sitting, Cuff Size: Normal)   Pulse 68   Temp 97.7 F (36.5 C) (Oral)   Resp 16  Vitals:   12/20/16 1058  BP: (!) 162/70  Pulse: 68  Resp: 16  Temp: 97.7 F (36.5 C)  TempSrc: Oral     Physical Exam  Constitutional: She appears well-developed and well-nourished.  HENT:  Right Ear: External ear normal. No drainage. Tympanic membrane is not injected.  Left Ear: Tympanic membrane and external ear normal. No drainage. Tympanic membrane is not injected.  Mouth/Throat: Oropharynx is clear and moist. No oropharyngeal exudate.  Tragal tenderness of left ear.  Neck: Neck supple.  Lymphadenopathy:    She has no cervical adenopathy.  Skin: Skin is warm and dry. Rash noted. There is erythema.  Scaly red patches on right anterior forearm and left arm.        Assessment & Plan:     1. Acute otitis externa of left ear, unspecified type  - neomycin-colistin-hydrocortisone-thonzonium (CORTISPORIN-TC) 3.09-16-08-0.5 MG/ML OTIC suspension; Place 4 drops into the left ear 3 (three) times daily.  Dispense: 10 mL; Refill: 0  2. Eczema, unspecified type  - triamcinolone cream (KENALOG) 0.1 %; Can use thin layer on affected areas 2x daily for one week at a time. Not for face.  Dispense: 30 g; Refill: 0  No Follow-up on file.  The entirety of the information documented in the History of Present Illness, Review of Systems and Physical Exam were personally obtained by me. Portions of this information were initially documented by Raquel Sarna D. and reviewed by me for thoroughness and accuracy.            Trinna Post, PA-C  [facility]

## 2016-12-21 ENCOUNTER — Telehealth: Payer: Self-pay | Admitting: Family Medicine

## 2016-12-21 DIAGNOSIS — H60502 Unspecified acute noninfective otitis externa, left ear: Secondary | ICD-10-CM

## 2016-12-21 MED ORDER — OFLOXACIN 0.3 % OT SOLN: 5.0000 [drp] | Freq: Every day | OTIC | 0 refills | Status: DC

## 2016-12-21 NOTE — Telephone Encounter (Signed)
Sorry, I sent it to mail order. Re-sent it to [facility]. Can we please call the mail order and cancel that one? Sorry about it.

## 2016-12-21 NOTE — Telephone Encounter (Signed)
Please review. Thanks!  

## 2016-12-21 NOTE — Telephone Encounter (Signed)
Sent in ofloxacin, 5 drops left ear daily until feeling better.

## 2016-12-21 NOTE — Telephone Encounter (Signed)
Pt advised.   Thanks,   -Antionio Negron  

## 2016-12-21 NOTE — Telephone Encounter (Signed)
Pt called saying she went to the pharmacy to pick up the new rx for her ear drops due to the first ones being too expensive.    She said the pharmacy told her nothing new had been sent in.  Please advise.  Thank sTeri

## 2016-12-22 DIAGNOSIS — E113553 Type 2 diabetes mellitus with stable proliferative diabetic retinopathy, bilateral: Secondary | ICD-10-CM | POA: Diagnosis not present

## 2016-12-22 NOTE — Procedures (Signed)
   [facility]  EEG (ELECTROENCEPHALOGRAM) REPORT   STUDY DATE: 12/14/16 PATIENT NAME: Susan House DOB: 30-Jul-1955 MRN: [id number]  ORDERING CLINICIAN: Larey Seat, MD   TECHNOLOGIST: Nonda Lou TECHNIQUE: Electroencephalogram was recorded utilizing standard 10-20 system of lead placement and reformatted into average and bipolar montages.  RECORDING TIME: 25 minutes ACTIVATION: photic stimulation  CLINICAL INFORMATION: 62 year old female with seizure.  FINDINGS: Background rhythms of 10-11 hertz and 30-40 microvolts. No focal, lateralizing, epileptiform activity or seizures are seen. Patient recorded in the awake and drowsy state.   IMPRESSION:  Normal EEG in the awake and drowsy states.    INTERPRETING PHYSICIAN:  Penni Bombard, MD Certified in Neurology, Neurophysiology and Neuroimaging  [facility] [address], [place], [place] [address] [phone]

## 2016-12-25 DIAGNOSIS — L97521 Non-pressure chronic ulcer of other part of left foot limited to breakdown of skin: Secondary | ICD-10-CM | POA: Diagnosis not present

## 2017-01-02 ENCOUNTER — Ambulatory Visit (INDEPENDENT_AMBULATORY_CARE_PROVIDER_SITE_OTHER): Payer: PPO | Admitting: Adult Health

## 2017-01-02 ENCOUNTER — Encounter: Payer: Self-pay | Admitting: Adult Health

## 2017-01-02 VITALS — BP 159/67 | HR 68 | Ht 61.0 in | Wt 252.8 lb

## 2017-01-02 DIAGNOSIS — Z8673 Personal history of transient ischemic attack (TIA), and cerebral infarction without residual deficits: Secondary | ICD-10-CM

## 2017-01-02 DIAGNOSIS — R569 Unspecified convulsions: Secondary | ICD-10-CM | POA: Diagnosis not present

## 2017-01-02 NOTE — Progress Notes (Signed)
PATIENT: Susan House DOB: March 30, 1956  REASON FOR VISIT: follow up-seizure, stroke HISTORY FROM: patient  HISTORY OF PRESENT ILLNESS: Susan House is a 61 year old female with a history of stroke and most recently possible seizure events. She was seen in May with Dr. Brett Fairy. At that time an EEG was repeated and she was placed on Keppra 750 mg twice a day. The EEG was unremarkable. She was unable to tolerate Keppra 750 mg twice a day. She was decreased to 500 mg twice a day. She reports that she is tolerating this better. She has not had any additional seizure-type events. Her primary care recently increased her metoprolol due to her blood pressure. Her last hemoglobin A1c was 7.1%. Patient states that she's not had any additional strokelike symptoms. She returns today for an evaluation.  HISTORY 11/27/16:  I have the pleasure of seeing Susan House in the presence of her husband, daughter and granddaughter. Susan House suffered a possible seizure episode on 10/15/2016. Susan House is a daughter described which she had witnessed a day; the patient had fallen out of bed and onto a stool that was placed next to the bed she had very limited recollection of what happened and her husband called to the emergency room services. Her daughter met her at the hospital and found her mother still very confused she had speech arrest. She tried to communicate by gesticulation. Was pantng, seemed in discomfort. VS were Ok. Patient remained restless and erratic, needing to nurses to observe her and basically help prevent injuries. Then within an hour or so the patient became suddenly fully aware of her surroundings. The patient had received nitroglycerin for chest pain, she had some aspirin after her stroke had been ruled out, nausea medication, but she was not given a seizure medicine or sedation.  The EEG during hospitalization was read by Dr. Alexis Goodell and described a drowsy rhythm with rare left  temporal sharp transients and phase reversal at the third temporal lead. Photic stimulation did not show any changes, hyperventilation was deferred. The finding was suggestive of a focal disturbance with epileptogenic potential. The patient was treated by Dr. Tressia Miners, and the discharge diagnosis on April 3 with acute encephalopathy with a new onset seizure, abnormal EEG, started on Keppra 500 mg twice a day to follow-up with neurology outpatient, no driving. She has taking it and feels slowed, thought process delay. Her family feels she is snappy and angry since starting the medication.  She takes narco for back pain. Other medications were baclofen, Lipitor, Flexeril, Lomotil, enalapril, gabapentin, magnesium oxide insulin did troponin, Protonix, nitroglycerin, Toprol. Plavix was discontinued after she had a GI bleed, colonoscopy confirmed ulceration 06-2016 . May re start after 6 month.  MM- 2017 [name] is a 61 year old female with a history of stroke with subsequent left hemiparesis. Her stroke occurred in 2000. She reports that she has continued on aspirin and Plavix. She is tolerating these medications well. Her blood pressure is slightly elevated today. Her primary care is managing her blood pressure medications. Reports that her last hemoglobin A1c was 8.4. She is currently on Lipitor for her cholesterol. She denies any additional strokelike symptoms. She states that she does have difficulty walking long distances and for that reason she will use a wheelchair. She has very limited movement of the left arm. She has found baclofen to be beneficial for spasms. She also has a history of restless leg symptoms. Her primary care tried her on Requip however  she did not find it beneficial. She is currently on gabapentin not specifically for restless leg symptoms. She states that her symptoms are tolerable but does at times interfere with her sleep. She returns today for an evaluation.  HISTORY (Dr.  Edwena Felty note): Susan House is a 61 year old Caucasian female patient with significant left hemiparetic residual and left homonymous hemianopsia after her stroke she suffered in 2000. She has continued to take Plavix and aspirin and her risk factors have not changed significantly since our last visit. She has a history of his hypertension, migraine headaches, gastroesophageal reflux, diabetes, hyperlipidemia, obesity, irritable bowel syndrome, urinary incontinence, and was last admitted with a UTI to [facility] in [place]. Admission date was 12/26/2015 under the guidance of Dr. Fulton Reek. Primary doctor is Miguel Aschoff Junior discharge date was 12/28/2015. The patient was diagnosed with altered mental status due to urosepsis. She was treated with 3 antibiotics. Upon discharge the patient was told that she did not have an infection? She was diagnosed with systemic inflammatory response syndrome and encephalopathy. A CT of the head was obtained on the 11th of June, it showed no acute intracranial abnormalities, an MRI of the brain with and without contrast followed on 12/28/2015."  It appeared stable in comparison to a brain image from 2015 with chronic deep left intracranial scoliosis, postoperative changes to both globes after cataract surgery, some minor nasal sinus mucosal thickening. 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 She was at least 8 hours out on a very hot and sunny day working outside not sure that she hydrated properly developing a terrific headache she cannot remember what happened that night from Saturday to Sunday and her husband reports that she was not able to sleep for a couple of nights. He accompanied her to the hospital. It  is likely that she had a heat related accident. She missed a couple of medicine doses as well.   REVIEW OF SYSTEMS: Out of a complete 14 system review of symptoms, the patient complains only of the following symptoms, and all other reviewed systems are negative.  See history of present illness  ALLERGIES: Allergies  Allergen Reactions  . Morphine And Related Anaphylaxis  . Fentanyl Other (See Comments)    Unknown reaction.  . Reglan [Metoclopramide]     Elevated BP  . Simvastatin Other (See Comments)    Muscle pain  . Betadine [Povidone Iodine] Rash  . Iodine Rash  . Tetracyclines & Related Rash    HOME MEDICATIONS: Outpatient Medications Prior to Visit  Medication Sig Dispense Refill  . ACCU-CHEK AVIVA PLUS test strip TEST BLOOD SUGAR THREE TIMES DAILY 300 each 3  . ACCU-CHEK SOFTCLIX LANCETS lancets CHECK BLOOD SUGAR THREE TIMES DAILY 300 each 3  . aspirin 81 MG EC tablet Take 1 tablet (81 mg total) by mouth daily. Swallow whole. 30 tablet 12  . atorvastatin (LIPITOR) 40 MG tablet Take 0.5 tablets (20 mg total) by mouth at bedtime. 90 tablet 3  . baclofen (LIORESAL) 20 MG tablet Take 1 tablet by mouth 2 (two) times daily as needed.     . Blood Glucose Monitoring Suppl (ACCU-CHEK AVIVA PLUS) w/Device KIT Check sugar three times daily DX E11.9-needs a meter 1 kit 0  . diphenoxylate-atropine (LOMOTIL) 2.5-0.025 MG  tablet 2 tablets initially and then 1 tablet every 6 hours as needed for diarrhea (Patient not taking: Reported on 12/20/2016) 60 tablet 1  . enalapril (VASOTEC) 20 MG tablet Take 1 tablet (20 mg total) by mouth 2 (two) times daily. 180 tablet 0  . HYDROcodone-acetaminophen (NORCO) 10-325 MG tablet     . insulin aspart protamine- aspart (NOVOLOG MIX 70/30) (70-30) 100 UNIT/ML injection Inject 25-30 Units into the skin 2 (two) times daily.     [employer] Devices (ACCU-CHEK SOFTCLIX) lancets Check sugar three times daily DX E11.9 100 each 3  . levETIRAcetam (KEPPRA) 500 MG  tablet Take 1 tablet (500 mg total) by mouth 2 (two) times daily. 500 mg bid. 180 tablet 3  . metFORMIN (GLUCOPHAGE) 1000 MG tablet Take 1 tablet (1,000 mg total) by mouth 2 (two) times daily. 180 tablet 3  . metoprolol succinate (TOPROL-XL) 100 MG 24 hr tablet Take 1 tablet (100 mg total) by mouth daily. 90 tablet 3  . nitroGLYCERIN (NITROSTAT) 0.4 MG SL tablet Place 1 tablet (0.4 mg total) under the tongue every 5 (five) minutes as needed for chest pain. 25 tablet 0  . ofloxacin (FLOXIN OTIC) 0.3 % OTIC solution Place 5 drops into the left ear daily. 5 mL 0  . oxybutynin (DITROPAN) 5 MG tablet Take 1 tablet (5 mg total) by mouth 3 (three) times daily. 270 tablet 3  . pantoprazole (PROTONIX) 40 MG tablet Take 1 tablet (40 mg total) by mouth daily. 90 tablet 3  . triamcinolone cream (KENALOG) 0.1 % Can use thin layer on affected areas 2x daily for one week at a time. Not for face. 30 g 0   No facility-administered medications prior to visit.     PAST MEDICAL HISTORY: Past Medical History:  Diagnosis Date  . Allergy   . Arthritis   . Cellulitis and abscess of face   . Depression   . Diabetes mellitus ([place])   . Edema   . GERD (gastroesophageal reflux disease)   . Hematuria   . History of echocardiogram    2008: normal, 2011: normal LVSF  . History of nuclear stress test    a. 12/2009: lexiscan - negative  . Hyperlipidemia   . Hypertension   . IBS (irritable bowel syndrome)   . Migraine   . Morbid obesity ([place])   . Nocturia   . Stroke ([place]) 2000  . TIA (transient ischemic attack) 2010  . Urgency of micturation   . Urinary frequency   . Urinary incontinence     PAST SURGICAL HISTORY: Past Surgical History:  Procedure Laterality Date  . ABDOMINAL HYSTERECTOMY    . ABDOMINOPLASTY    . APPENDECTOMY    . BACK SURGERY     extensive spinal fusion  . carpel tunnel surgery    . CATARACT EXTRACTION     x 2  . CESAREAN SECTION    . CHOLECYSTECTOMY    . COLONOSCOPY  2010  .  COLONOSCOPY WITH PROPOFOL N/A 07/11/2016   Procedure: COLONOSCOPY WITH PROPOFOL;  Surgeon: Lucilla Lame, MD;  Location: [facility] ENDOSCOPY;  Service: Endoscopy;  Laterality: N/A;  . EYE SURGERY    . HERNIA REPAIR    . SHOULDER SURGERY    . TOE SURGERY    . UPPER GI ENDOSCOPY    . WRIST FRACTURE SURGERY Left     FAMILY HISTORY: Family History  Problem Relation Age of Onset  . Hyperlipidemia Mother   . Arrhythmia Mother  WPW  . Hypertension Mother   . Rheum arthritis Mother   . Heart disease Mother   . Heart attack Father 26  . Hyperlipidemia Father   . Hypertension Father   . Stroke Father   . Diabetes Father   . Heart disease Father   . Coronary artery disease Father   . Diabetes Sister   . Hyperlipidemia Sister   . Hypertension Sister   . Depression Sister   . Depression Sister   . Diabetes Sister   . Hypertension Sister   . Hyperlipidemia Sister   . Kidney disease Paternal Grandmother     SOCIAL HISTORY: Social History   Social History  . Marital status: Married    Spouse name: Marcello Moores   . Number of children: 2  . Years of education: 15   Occupational History  . Disabled    . retired    Social History Main Topics  . Smoking status: Never Smoker  . Smokeless tobacco: Never Used  . Alcohol use No  . Drug use: No  . Sexual activity: No   Other Topics Concern  . Not on file   Social History Narrative   ** Merged History Encounter **       Patient lives at home with husband Marcello Moores.    Patient has 2 children and 2 step children.    Patient has 12+ years of education.    Patient is Disabled.       PHYSICAL EXAM  Vitals:   01/02/17 0808  BP: (!) 159/67  Pulse: 68  Weight: 252 lb 12.8 oz (114.7 kg)  Height: _0  (1.549 m)   Body mass index is 47.77 kg/m.  Generalized: Well developed, in no acute distress   Neurological examination  Mentation: Alert oriented to time, place, history taking. Follows all commands speech and language  fluent Cranial nerve II-XII: Pupils were equal round reactive to light. Extraocular movements were full, visual field were full on confrontational test. Facial sensation and strength were normal. Uvula tongue midline. Head turning and shoulder shrug  were normal and symmetric. Motor: The motor testing reveals 5 over 5 strengthIn the right upper and lower extremity. 4/5 strength in the left upper and lower extremity. Restrictive movement in the left upper extremity..  Sensory: Sensory testing is intact to soft touch on all 4 extremities. No evidence of extinction is noted.  Coordination: Cerebellar testing reveals good finger-nose-finger on the right. Unable to complete on the left due to mobility. Goodheel-to-shin bilaterally.  Gait and station: Patient is in a wheelchair today.  DIAGNOSTIC DATA (LABS, IMAGING, TESTING) - I reviewed patient records, labs, notes, testing and imaging myself where available.  Lab Results  Component Value Date   WBC 8.6 11/22/2016   HGB 12.7 11/22/2016   HCT 37.7 11/22/2016   MCV 84.4 11/22/2016   PLT 221 11/22/2016      Component Value Date/Time   NA 137 11/22/2016 1209   NA 137 07/25/2016 1429   NA 140 06/17/2014 0744   K 4.2 11/22/2016 1209   K 4.1 06/17/2014 0744   CL 103 11/22/2016 1209   CL 105 06/17/2014 0744   CO2 24 11/22/2016 1209   CO2 27 06/17/2014 0744   GLUCOSE 158 (H) 11/22/2016 1209   GLUCOSE 116 (H) 06/17/2014 0744   BUN 24 (H) 11/22/2016 1209   BUN 22 07/25/2016 1429   BUN 35 (H) 06/17/2014 0744   CREATININE 1.25 (H) 11/22/2016 1209   CREATININE 1.29 06/17/2014 0744  CALCIUM 9.0 11/22/2016 1209   CALCIUM 8.6 06/17/2014 0744   PROT 6.7 07/25/2016 1429   PROT 6.5 01/18/2014 0059   ALBUMIN 4.1 07/25/2016 1429   ALBUMIN 3.0 (L) 01/18/2014 0059   AST 23 07/25/2016 1429   AST 21 01/18/2014 0059   ALT 16 07/25/2016 1429   ALT 18 01/18/2014 0059   ALKPHOS 79 07/25/2016 1429   ALKPHOS 97 01/18/2014 0059   BILITOT 0.4 07/25/2016  1429   BILITOT 0.3 01/18/2014 0059   GFRNONAA 46 (L) 11/22/2016 1209   GFRNONAA 45 (L) 06/17/2014 0744   GFRNONAA 47 (L) 01/18/2014 0059   GFRAA 53 (L) 11/22/2016 1209   GFRAA 55 (L) 06/17/2014 0744   GFRAA 55 (L) 01/18/2014 0059   Lab Results  Component Value Date   CHOL 144 10/15/2016   HDL 51 10/15/2016   LDLCALC 69 10/15/2016   TRIG 122 10/15/2016   CHOLHDL 2.8 10/15/2016   Lab Results  Component Value Date   HGBA1C 7.8 (H) 10/15/2016   Lab Results  Component Value Date   UNHRVACQ58 483 01/15/2009   Lab Results  Component Value Date   TSH 3.370 07/18/2016      ASSESSMENT AND PLAN 61 y.o. year old female  has a past medical history of Allergy; Arthritis; Cellulitis and abscess of face; Depression; Diabetes mellitus ([place]); Edema; GERD (gastroesophageal reflux disease); Hematuria; History of echocardiogram; History of nuclear stress test; Hyperlipidemia; Hypertension; IBS (irritable bowel syndrome); Migraine; Morbid obesity ([place]); Nocturia; Stroke ([place]) (2000); TIA (transient ischemic attack) (2010); Urgency of micturation; Urinary frequency; and Urinary incontinence. here with:  1. Seizure 2. History of stroke  The patient has not had any seizure like events since she began [place]. She will continue on Keppra 500 mg twice a day. If she has any additional events she will let us know. She will continue on aspirin for stroke prevention. She will maintain strict control of her blood pressure with goal  less than 130/90, cholesterol LDL less than 70 and hemoglobin A1c less than 6.5%. Patient is advised that if she has any strokelike symptoms she should call 911 or have someone take her to the emergency room. Patient voices understanding. She will follow-up in 6 months or sooner if needed.    Ward Givens, MSN, NP-C 01/02/2017, 8:02 AM [facility] [address], [place], [place] [address] [phone]

## 2017-01-02 NOTE — Progress Notes (Signed)
I have reviewed and agreed above plan. 

## 2017-01-02 NOTE — Patient Instructions (Signed)
Contiue Keppra 500 mg twice a day Continue Aspirin Blood Pressure <130/90 Cholesterol LDL <70 HgbA1c <6.5 % If your symptoms worsen or you develop new symptoms please let us know.

## 2017-01-08 ENCOUNTER — Other Ambulatory Visit: Payer: Self-pay | Admitting: Family Medicine

## 2017-01-08 DIAGNOSIS — I10 Essential (primary) hypertension: Secondary | ICD-10-CM

## 2017-01-10 ENCOUNTER — Other Ambulatory Visit: Payer: Self-pay | Admitting: Family Medicine

## 2017-01-10 NOTE — Telephone Encounter (Signed)
Patient needs a refill on HYDROcodone-acetaminophen (NORCO) 10-325 MG tablet.

## 2017-01-10 NOTE — Telephone Encounter (Signed)
Last fill was 09/18/16-aa

## 2017-01-11 MED ORDER — HYDROCODONE-ACETAMINOPHEN 10-325 MG PO TABS: 1.0000 | ORAL_TABLET | ORAL | 0 refills | Status: DC | PRN

## 2017-01-16 ENCOUNTER — Ambulatory Visit: Payer: Self-pay | Admitting: Physician Assistant

## 2017-01-18 ENCOUNTER — Ambulatory Visit: Payer: Self-pay | Admitting: Neurology

## 2017-01-22 IMAGING — US US CAROTID DUPLEX BILAT
1 series · 13 of 24 positions shown · non-contrast
Comparison: None.

CLINICAL DATA: TIA

EXAM:
BILATERAL CAROTID DUPLEX ULTRASOUND
TECHNIQUE: Gray scale imaging, color Doppler and duplex ultrasound were
performed of bilateral carotid and vertebral arteries in the neck.

[Series 1: us carotid duplex bilat · 13 of 62 slices shown]
[im 1/62]
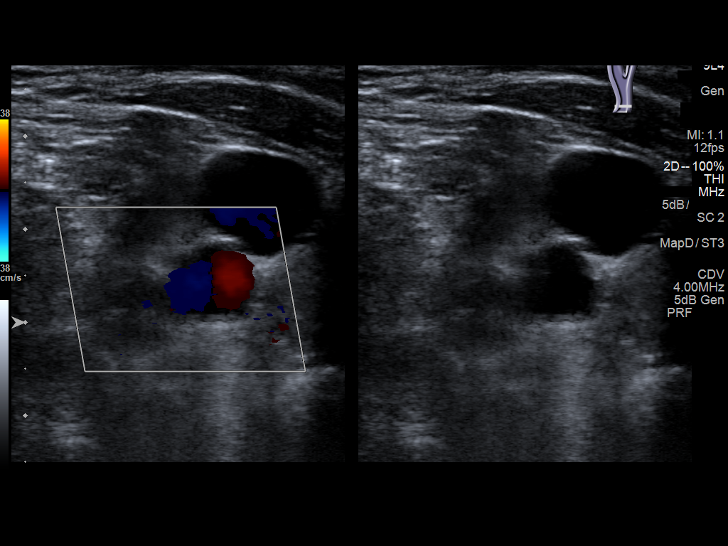
[im 6/62]
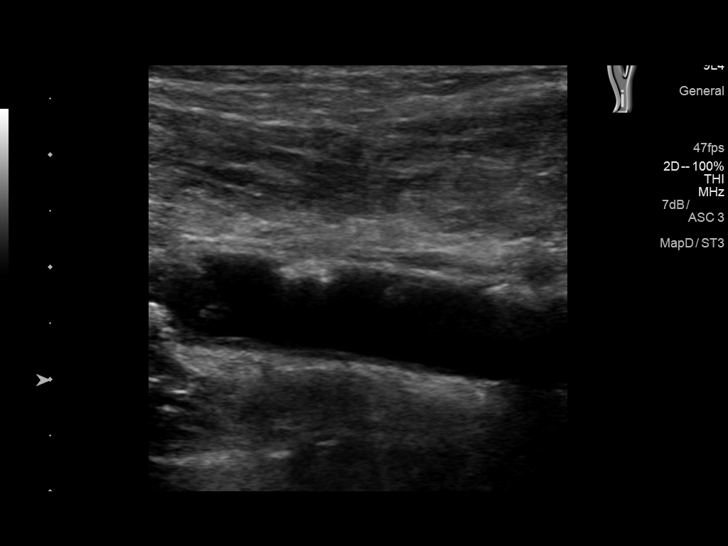
[im 11/62]
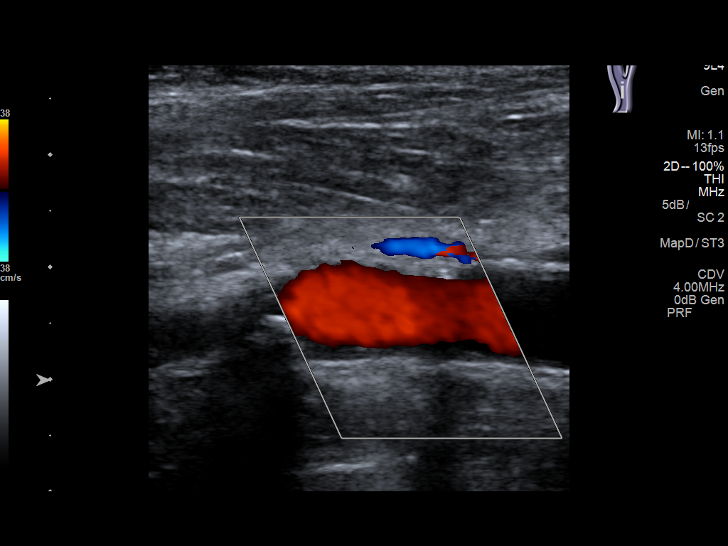
[im 16/62]
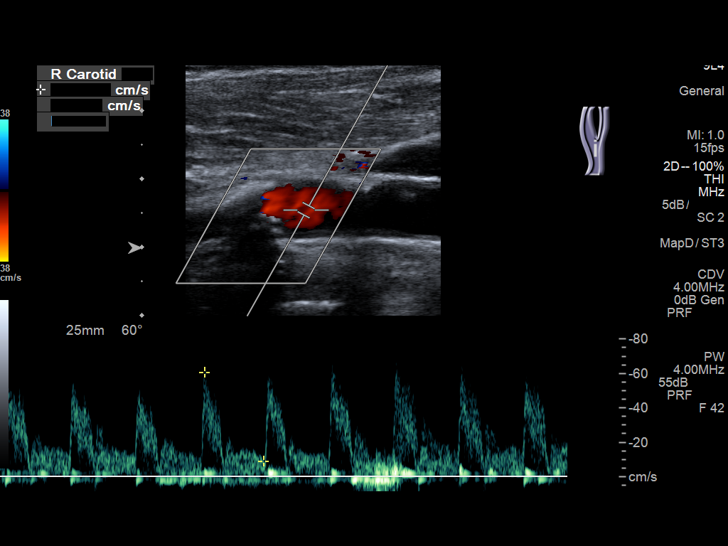
[im 22/62]
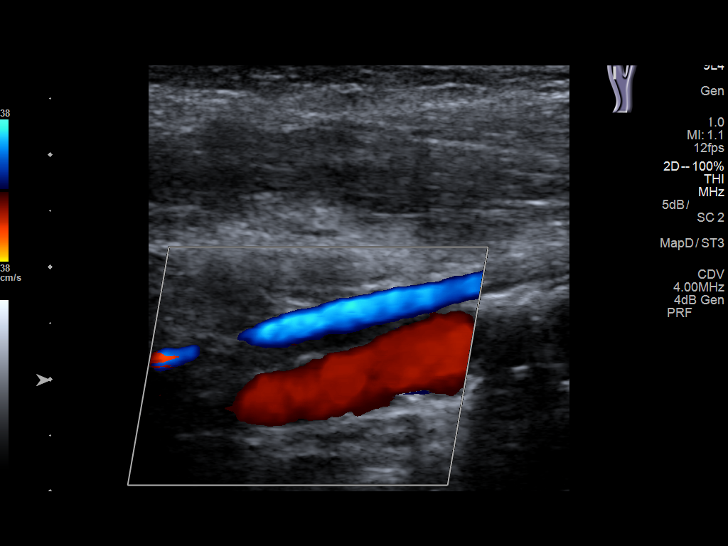
[im 27/62]
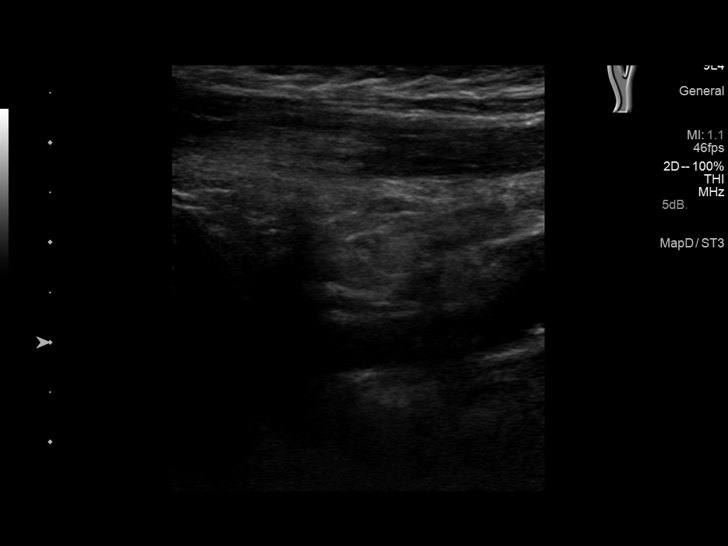
[im 32/62]
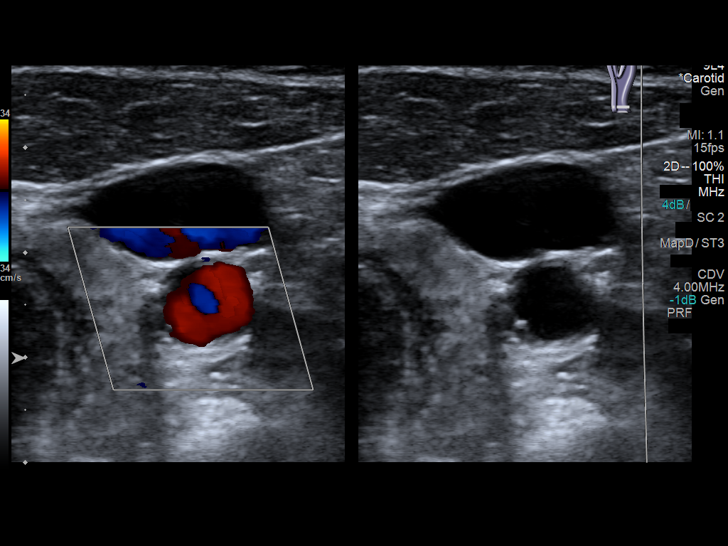
[im 35/62]
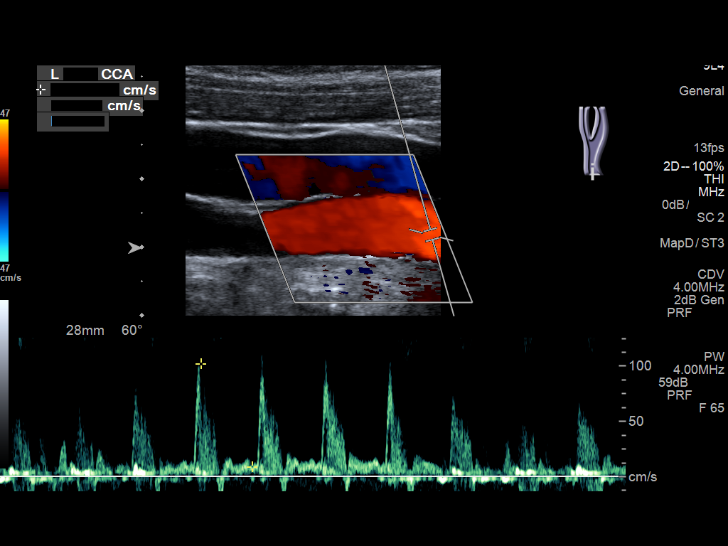
[im 40/62]
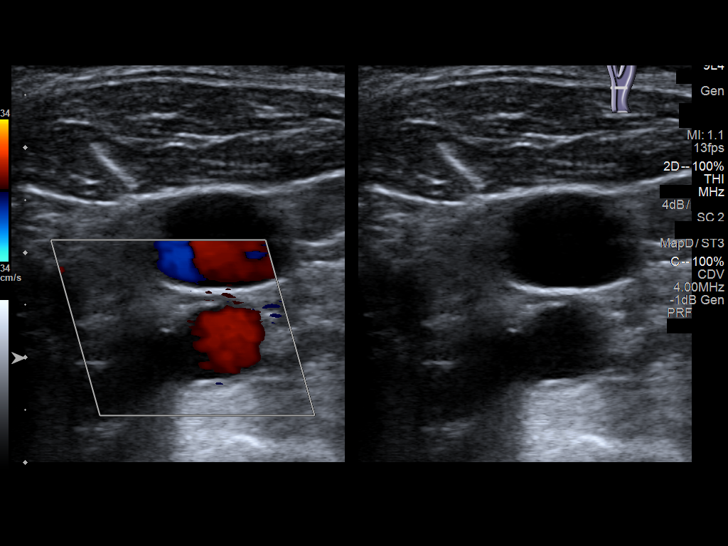
[im 46/62]
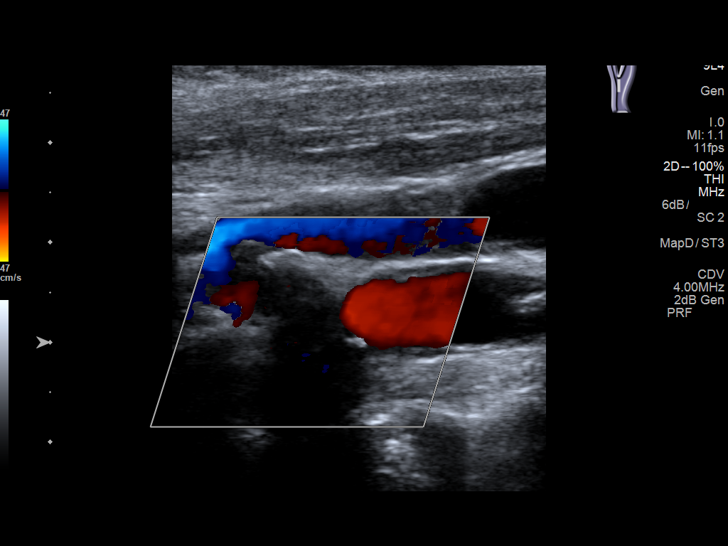
[im 51/62]
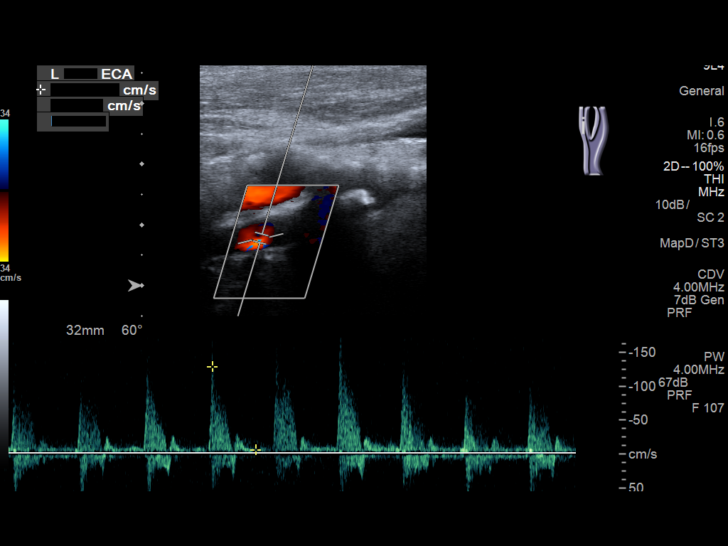
[im 56/62]
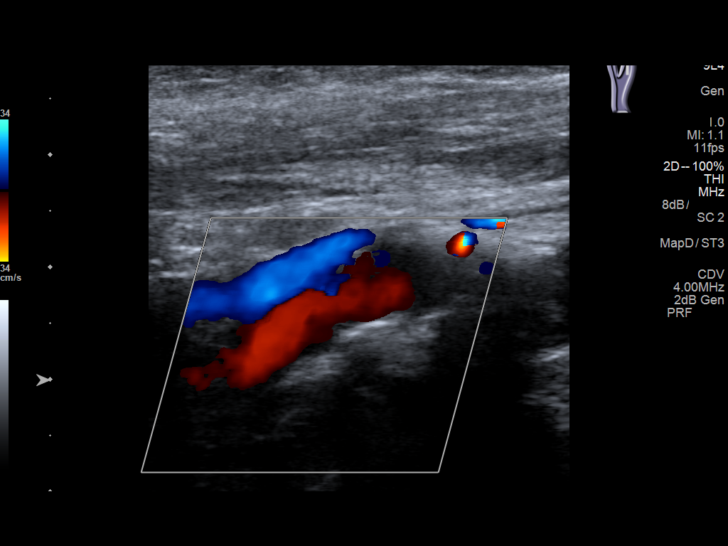
[im 62/62]
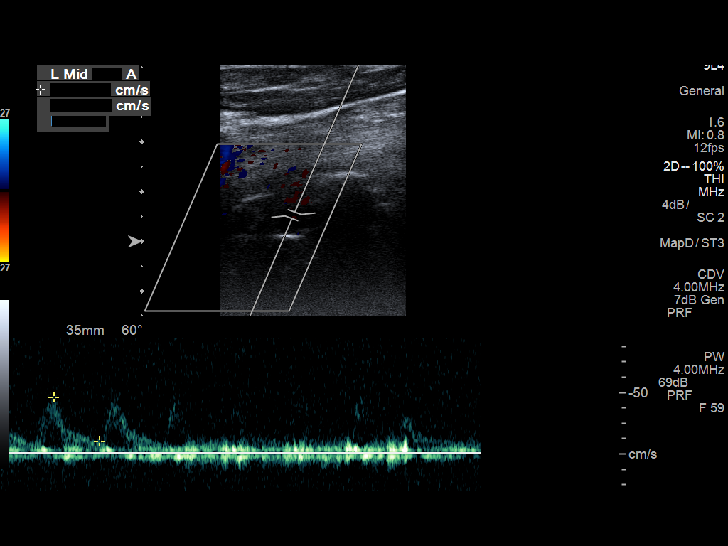

[13 of 24 positions shown; findings below may reference images not displayed]

FINDINGS: Criteria: Quantification of carotid stenosis is based on velocity
parameters that correlate the residual internal carotid diameter
with NASCET-based stenosis levels, using the diameter of the distal
internal carotid lumen as the denominator for stenosis measurement.

The following velocity measurements were obtained:

RIGHT

ICA:  199 cm/sec

CCA:  70 cm/sec

SYSTOLIC ICA/CCA RATIO:

DIASTOLIC ICA/CCA RATIO:

ECA:  180 cm/sec

LEFT

ICA:  94 cm/sec

CCA:  56 cm/sec

SYSTOLIC ICA/CCA RATIO:

DIASTOLIC ICA/CCA RATIO:

ECA:  129 cm/sec

RIGHT CAROTID ARTERY: Extensive calcified plaque in the bulb. Low
resistance internal carotid Doppler pattern.

RIGHT VERTEBRAL ARTERY:  Antegrade.

LEFT CAROTID ARTERY: Moderate calcified plaque in the left bulb. Low
resistance internal carotid Doppler pattern.

LEFT VERTEBRAL ARTERY:  Antegrade.
IMPRESSION: 50-69% stenosis in the right internal carotid artery.

Less than 50% stenosis in the left internal carotid artery

## 2017-01-23 DIAGNOSIS — L97521 Non-pressure chronic ulcer of other part of left foot limited to breakdown of skin: Secondary | ICD-10-CM | POA: Diagnosis not present

## 2017-01-23 DIAGNOSIS — E119 Type 2 diabetes mellitus without complications: Secondary | ICD-10-CM | POA: Diagnosis not present

## 2017-01-23 DIAGNOSIS — B351 Tinea unguium: Secondary | ICD-10-CM | POA: Diagnosis not present

## 2017-01-31 ENCOUNTER — Ambulatory Visit (INDEPENDENT_AMBULATORY_CARE_PROVIDER_SITE_OTHER): Payer: PPO

## 2017-01-31 ENCOUNTER — Ambulatory Visit (INDEPENDENT_AMBULATORY_CARE_PROVIDER_SITE_OTHER): Payer: PPO | Admitting: Family Medicine

## 2017-01-31 VITALS — BP 164/82 | HR 76 | Temp 98.5°F | Ht 61.0 in | Wt 252.2 lb

## 2017-01-31 VITALS — BP 144/60 | HR 76 | Temp 98.5°F | Ht 61.0 in | Wt 252.2 lb

## 2017-01-31 DIAGNOSIS — E118 Type 2 diabetes mellitus with unspecified complications: Secondary | ICD-10-CM | POA: Diagnosis not present

## 2017-01-31 DIAGNOSIS — Z0000 Encounter for general adult medical examination without abnormal findings: Secondary | ICD-10-CM | POA: Diagnosis not present

## 2017-01-31 DIAGNOSIS — Z1231 Encounter for screening mammogram for malignant neoplasm of breast: Secondary | ICD-10-CM | POA: Diagnosis not present

## 2017-01-31 DIAGNOSIS — I638 Other cerebral infarction: Secondary | ICD-10-CM | POA: Diagnosis not present

## 2017-01-31 DIAGNOSIS — M545 Low back pain, unspecified: Secondary | ICD-10-CM

## 2017-01-31 DIAGNOSIS — L509 Urticaria, unspecified: Secondary | ICD-10-CM

## 2017-01-31 DIAGNOSIS — I69359 Hemiplegia and hemiparesis following cerebral infarction affecting unspecified side: Secondary | ICD-10-CM

## 2017-01-31 DIAGNOSIS — Z1159 Encounter for screening for other viral diseases: Secondary | ICD-10-CM | POA: Diagnosis not present

## 2017-01-31 DIAGNOSIS — I6389 Other cerebral infarction: Secondary | ICD-10-CM

## 2017-01-31 DIAGNOSIS — G8929 Other chronic pain: Secondary | ICD-10-CM

## 2017-01-31 DIAGNOSIS — Z794 Long term (current) use of insulin: Secondary | ICD-10-CM

## 2017-01-31 DIAGNOSIS — I10 Essential (primary) hypertension: Secondary | ICD-10-CM

## 2017-01-31 DIAGNOSIS — Z1239 Encounter for other screening for malignant neoplasm of breast: Secondary | ICD-10-CM

## 2017-01-31 LAB — POCT GLYCOSYLATED HEMOGLOBIN (HGB A1C): Hemoglobin A1C: 7.3

## 2017-01-31 MED ORDER — MONTELUKAST SODIUM 10 MG PO TABS: 10.0000 mg | ORAL_TABLET | Freq: Every day | ORAL | 3 refills | Status: DC

## 2017-01-31 NOTE — Patient Instructions (Signed)
[name] , Thank you for taking time to come for your Medicare Wellness Visit. I appreciate your ongoing commitment to your health goals. Please review the following plan we discussed and let me know if I can assist you in the future.   Screening recommendations/referrals: Colonoscopy: completed 07/11/16, due 06/2026 Mammogram: per patient received in 2017 Recommended yearly ophthalmology/optometry visit for glaucoma screening and checkup Recommended yearly dental visit for hygiene and checkup  Vaccinations: Influenza vaccine: due 03/2017 Pneumococcal vaccine: due at age 42  Tdap vaccine: completed 04/27/11, due 04/2021 Shingles vaccine: declined   Advanced directives: Please bring a copy of your POA (Power of [place]) and/or Living Will to your next appointment.   Conditions/risks identified: Obesity; Recommend increasing water intake to 6-8 glasses a day.    Next appointment: None, need to schedule 1 year AWV.  Preventive Care 40-64 Years, Female Preventive care refers to lifestyle choices and visits with your health care provider that can promote health and wellness. What does preventive care include?  A yearly physical exam. This is also called an annual well check.  Dental exams once or twice a year.  Routine eye exams. Ask your health care provider how often you should have your eyes checked.  Personal lifestyle choices, including:  Daily care of your teeth and gums.  Regular physical activity.  Eating a healthy diet.  Avoiding tobacco and drug use.  Limiting alcohol use.  Practicing safe sex.  Taking low-dose aspirin daily starting at age 76.  Taking vitamin and mineral supplements as recommended by your health care provider. What happens during an annual well check? The services and screenings done by your health care provider during your annual well check will depend on your age, overall health, lifestyle risk factors, and family history of  disease. Counseling  Your health care provider may ask you questions about your:  Alcohol use.  Tobacco use.  Drug use.  Emotional well-being.  Home and relationship well-being.  Sexual activity.  Eating habits.  Work and work Statistician.  Method of birth control.  Menstrual cycle.  Pregnancy history. Screening  You may have the following tests or measurements:  Height, weight, and BMI.  Blood pressure.  Lipid and cholesterol levels. These may be checked every 5 years, or more frequently if you are over 17 years old.  Skin check.  Lung cancer screening. You may have this screening every year starting at age 60 if you have a 30-pack-year history of smoking and currently smoke or have quit within the past 15 years.  Fecal occult blood test (FOBT) of the stool. You may have this test every year starting at age 31.  Flexible sigmoidoscopy or colonoscopy. You may have a sigmoidoscopy every 5 years or a colonoscopy every 10 years starting at age 66.  Hepatitis C blood test.  Hepatitis B blood test.  Sexually transmitted disease (STD) testing.  Diabetes screening. This is done by checking your blood sugar (glucose) after you have not eaten for a while (fasting). You may have this done every 1-3 years.  Mammogram. This may be done every 1-2 years. Talk to your health care provider about when you should start having regular mammograms. This may depend on whether you have a family history of breast cancer.  BRCA-related cancer screening. This may be done if you have a family history of breast, ovarian, tubal, or peritoneal cancers.  Pelvic exam and Pap test. This may be done every 3 years starting at age 27. Starting  at age 78, this may be done every 5 years if you have a Pap test in combination with an HPV test.  Bone density scan. This is done to screen for osteoporosis. You may have this scan if you are at high risk for osteoporosis. Discuss your test results,  treatment options, and if necessary, the need for more tests with your health care provider. Vaccines  Your health care provider may recommend certain vaccines, such as:  Influenza vaccine. This is recommended every year.  Tetanus, diphtheria, and acellular pertussis (Tdap, Td) vaccine. You may need a Td booster every 10 years.  Zoster vaccine. You may need this after age 31.  Pneumococcal 13-valent conjugate (PCV13) vaccine. You may need this if you have certain conditions and were not previously vaccinated.  Pneumococcal polysaccharide (PPSV23) vaccine. You may need one or two doses if you smoke cigarettes or if you have certain conditions. Talk to your health care provider about which screenings and vaccines you need and how often you need them. This information is not intended to replace advice given to you by your health care provider. Make sure you discuss any questions you have with your health care provider. Document Released: 07/30/2015 Document Revised: 03/22/2016 Document Reviewed: 05/04/2015 Elsevier Interactive Patient Education  2017 [place] Prevention in the Home Falls can cause injuries. They can happen to people of all ages. There are many things you can do to make your home safe and to help prevent falls. What can I do on the outside of my home?  Regularly fix the edges of walkways and driveways and fix any cracks.  Remove anything that might make you trip as you walk through a door, such as a raised step or threshold.  Trim any bushes or trees on the path to your home.  Use bright outdoor lighting.  Clear any walking paths of anything that might make someone trip, such as rocks or tools.  Regularly check to see if handrails are loose or broken. Make sure that both sides of any steps have handrails.  Any raised decks and porches should have guardrails on the edges.  Have any leaves, snow, or ice cleared regularly.  Use sand or salt on walking  paths during winter.  Clean up any spills in your garage right away. This includes oil or grease spills. What can I do in the bathroom?  Use night lights.  Install grab bars by the toilet and in the tub and shower. Do not use towel bars as grab bars.  Use non-skid mats or decals in the tub or shower.  If you need to sit down in the shower, use a plastic, non-slip stool.  Keep the floor dry. Clean up any water that spills on the floor as soon as it happens.  Remove soap buildup in the tub or shower regularly.  Attach bath mats securely with double-sided non-slip rug tape.  Do not have throw rugs and other things on the floor that can make you trip. What can I do in the bedroom?  Use night lights.  Make sure that you have a light by your bed that is easy to reach.  Do not use any sheets or blankets that are too big for your bed. They should not hang down onto the floor.  Have a firm chair that has side arms. You can use this for support while you get dressed.  Do not have throw rugs and other things on the floor  that can make you trip. What can I do in the kitchen?  Clean up any spills right away.  Avoid walking on wet floors.  Keep items that you use a lot in easy-to-reach places.  If you need to reach something above you, use a strong step stool that has a grab bar.  Keep electrical cords out of the way.  Do not use floor polish or wax that makes floors slippery. If you must use wax, use non-skid floor wax.  Do not have throw rugs and other things on the floor that can make you trip. What can I do with my stairs?  Do not leave any items on the stairs.  Make sure that there are handrails on both sides of the stairs and use them. Fix handrails that are broken or loose. Make sure that handrails are as long as the stairways.  Check any carpeting to make sure that it is firmly attached to the stairs. Fix any carpet that is loose or worn.  Avoid having throw rugs at  the top or bottom of the stairs. If you do have throw rugs, attach them to the floor with carpet tape.  Make sure that you have a light switch at the top of the stairs and the bottom of the stairs. If you do not have them, ask someone to add them for you. What else can I do to help prevent falls?  Wear shoes that:  Do not have high heels.  Have rubber bottoms.  Are comfortable and fit you well.  Are closed at the toe. Do not wear sandals.  If you use a stepladder:  Make sure that it is fully opened. Do not climb a closed stepladder.  Make sure that both sides of the stepladder are locked into place.  Ask someone to hold it for you, if possible.  Clearly mark and make sure that you can see:  Any grab bars or handrails.  First and last steps.  Where the edge of each step is.  Use tools that help you move around (mobility aids) if they are needed. These include:  Canes.  Walkers.  Scooters.  Crutches.  Turn on the lights when you go into a dark area. Replace any light bulbs as soon as they burn out.  Set up your furniture so you have a clear path. Avoid moving your furniture around.  If any of your floors are uneven, fix them.  If there are any pets around you, be aware of where they are.  Review your medicines with your doctor. Some medicines can make you feel dizzy. This can increase your chance of falling. Ask your doctor what other things that you can do to help prevent falls. This information is not intended to replace advice given to you by your health care provider. Make sure you discuss any questions you have with your health care provider. Document Released: 04/29/2009 Document Revised: 12/09/2015 Document Reviewed: 08/07/2014 Elsevier Interactive Patient Education  2017 [employer].

## 2017-01-31 NOTE — Progress Notes (Signed)
Subjective:   Susan House is a 61 y.o. female who presents for Medicare Annual (Subsequent) preventive examination.  Review of Systems:  N/A  Cardiac Risk Factors include: advanced age (>57mn, >>36women);diabetes mellitus;dyslipidemia;hypertension;obesity (BMI >30kg/m2)     Objective:     Vitals: BP (!) 164/82 (BP Location: Left Arm)   Pulse 76   Temp 98.5 F (36.9 C) (Oral)   Ht 5' 1"  (1.549 m)   Wt 252 lb 3.2 oz (114.4 kg)   BMI 47.65 kg/m   Body mass index is 47.65 kg/m.   Tobacco History  Smoking Status  . Never Smoker  Smokeless Tobacco  . Never Used     Counseling given: Not Answered   Past Medical History:  Diagnosis Date  . Allergy   . Arthritis   . Cellulitis and abscess of face   . Depression   . Diabetes mellitus ([place]   . Edema   . GERD (gastroesophageal reflux disease)   . Hematuria   . History of echocardiogram    2008: normal, 2011: normal LVSF  . History of nuclear stress test    a. 12/2009: lexiscan - negative  . Hyperlipidemia   . Hypertension   . IBS (irritable bowel syndrome)   . Migraine   . Morbid obesity ([place]   . Nocturia   . [place]  . TIA (transient ischemic attack) 2010  . Urgency of micturation   . Urinary frequency   . Urinary incontinence    Past Surgical History:  Procedure Laterality Date  . ABDOMINAL HYSTERECTOMY    . ABDOMINOPLASTY    . APPENDECTOMY    . BACK SURGERY     extensive spinal fusion  . carpel tunnel surgery    . CATARACT EXTRACTION     x 2  . CESAREAN SECTION    . CHOLECYSTECTOMY    . COLONOSCOPY  2010  . COLONOSCOPY WITH PROPOFOL N/A 07/11/2016   Procedure: COLONOSCOPY WITH PROPOFOL;  Surgeon: DLucilla Lame MD;  Location: [facility] ENDOSCOPY;  Service: Endoscopy;  Laterality: N/A;  . EYE SURGERY    . HERNIA REPAIR    . SHOULDER SURGERY    . TOE SURGERY    . UPPER GI ENDOSCOPY    . WRIST FRACTURE SURGERY Left    Family History  Problem Relation Age of Onset  .  Hyperlipidemia Mother   . Arrhythmia Mother        WPW  . Hypertension Mother   . Rheum arthritis Mother   . Heart disease Mother   . Heart attack Father [id number] . Hyperlipidemia Father   . Hypertension Father   . Stroke Father   . Diabetes Father   . Heart disease Father   . Coronary artery disease Father   . Diabetes Sister   . Hyperlipidemia Sister   . Hypertension Sister   . Depression Sister   . Depression Sister   . Diabetes Sister   . Hypertension Sister   . Hyperlipidemia Sister   . Kidney disease Paternal Grandmother    History  Sexual Activity  . Sexual activity: No    Outpatient Encounter Prescriptions as of 01/31/2017  Medication Sig  . ACCU-CHEK AVIVA PLUS test strip TEST BLOOD SUGAR THREE TIMES DAILY  . ACCU-CHEK SOFTCLIX LANCETS lancets CHECK BLOOD SUGAR THREE TIMES DAILY  . amLODipine (NORVASC) 10 MG tablet Take 10 mg by mouth daily.  .Marland Kitchenaspirin 81 MG EC tablet Take 1 tablet (81 mg total) by mouth  daily. Swallow whole.  Marland Kitchen atorvastatin (LIPITOR) 40 MG tablet Take 0.5 tablets (20 mg total) by mouth at bedtime.  . Blood Glucose Monitoring Suppl (ACCU-CHEK AVIVA PLUS) w/Device KIT Check sugar three times daily DX E11.9-needs a meter  . diphenoxylate-atropine (LOMOTIL) 2.5-0.025 MG tablet 2 tablets initially and then 1 tablet every 6 hours as needed for diarrhea  . enalapril (VASOTEC) 20 MG tablet Take 1 tablet (20 mg total) by mouth 2 (two) times daily.  Marland Kitchen HYDROcodone-acetaminophen (NORCO) 10-325 MG tablet Take 1-2 tablets by mouth every 4 (four) hours as needed.  . insulin aspart protamine- aspart (NOVOLOG MIX 70/30) (70-30) 100 UNIT/ML injection Inject 25-30 Units into the skin 2 (two) times daily.   Elmore Guise Devices (ACCU-CHEK SOFTCLIX) lancets Check sugar three times daily DX E11.9  . levETIRAcetam (KEPPRA) 500 MG tablet Take 1 tablet (500 mg total) by mouth 2 (two) times daily. 500 mg bid.  . metFORMIN (GLUCOPHAGE) 1000 MG tablet Take 1 tablet (1,000 mg total) by  mouth 2 (two) times daily.  . metoprolol succinate (TOPROL-XL) 100 MG 24 hr tablet Take 1 tablet (100 mg total) by mouth daily.  . nitroGLYCERIN (NITROSTAT) 0.4 MG SL tablet Place 1 tablet (0.4 mg total) under the tongue every 5 (five) minutes as needed for chest pain.  Marland Kitchen oxybutynin (DITROPAN) 5 MG tablet Take 1 tablet (5 mg total) by mouth 3 (three) times daily.  . pantoprazole (PROTONIX) 40 MG tablet Take 1 tablet (40 mg total) by mouth daily.  Marland Kitchen triamcinolone cream (KENALOG) 0.1 % Can use thin layer on affected areas 2x daily for one week at a time. Not for face.  . [DISCONTINUED] metoprolol succinate (TOPROL-XL) 100 MG 24 hr tablet Take 1 tablet by mouth daily. Take with or immediately following a meal.   No facility-administered encounter medications on file as of 01/31/2017.     Activities of Daily Living In your present state of health, do you have any difficulty performing the following activities: 01/31/2017 10/15/2016  Hearing? N N  Vision? N Y  Difficulty concentrating or making decisions? N Y  Walking or climbing stairs? Y Y  Dressing or bathing? N N  Doing errands, shopping? Y N  Preparing Food and eating ? N -  Using the Toilet? N -  In the past six months, have you accidently leaked urine? Y -  Do you have problems with loss of bowel control? Y -  Managing your Medications? N -  Managing your Finances? N -  Housekeeping or managing your Housekeeping? N -  Some recent data might be hidden    Patient Care Team: Jerrol Banana., MD as PCP - General (Family Medicine) Christene Lye, MD (General Surgery) Requested, Self Rockey Situ, Kathlene November, MD as Consulting Physician (Cardiology) Sharlotte Alamo, DPM (Podiatry) Bary Castilla, Forest Gleason, MD (General Surgery) Leandrew Koyanagi, MD as Referring Physician (Ophthalmology)    Assessment:     Exercise Activities and Dietary recommendations Current Exercise Habits: The patient does not participate in regular exercise at  present, Exercise limited by: orthopedic condition(s)  Goals    . Increase water intake          Recommend increasing water intake to 6-8 glasses a day.       Fall Risk Fall Risk  01/31/2017 10/10/2016 06/12/2016 04/26/2016 09/08/2015  Falls in the past year? No No Yes Yes No  Number falls in past yr: - - 2 or more 1 -  Injury with Fall? - - No  Yes -  Risk Factor Category  - - - - -  Risk for fall due to : - - - Impaired mobility;Impaired balance/gait;History of fall(s) -  Follow up - - - Falls evaluation completed;Education provided;Falls prevention discussed -   Depression Screen PHQ 2/9 Scores 01/31/2017 04/26/2016 09/08/2015 03/11/2015  PHQ - 2 Score 0 0 0 0     Cognitive Function     6CIT Screen 01/31/2017  What Year? 0 points  What month? 0 points  What time? 0 points  Count back from 20 0 points  Months in reverse 0 points  Repeat phrase 0 points  Total Score 0    Immunization History  Administered Date(s) Administered  . Influenza Split 05/09/2006, 03/07/2010, 04/27/2011  . Influenza, High Dose Seasonal PF 04/21/2016  . Influenza,inj,Quad PF,36+ Mos 04/18/2013, 04/23/2014, 04/12/2015  . Pneumococcal Polysaccharide-23 05/25/1999, 05/09/2006  . Td 07/04/1999  . Tdap 04/27/2011   Screening Tests Health Maintenance  Topic Date Due  . Hepatitis C Screening  [birth year]  . INFLUENZA VACCINE  02/14/2017  . MAMMOGRAM  03/25/2017  . HEMOGLOBIN A1C  04/16/2017  . OPHTHALMOLOGY EXAM  04/24/2017  . FOOT EXAM  01/23/2018  . TETANUS/TDAP  04/26/2021  . COLONOSCOPY  07/11/2026  . PNEUMOCOCCAL POLYSACCHARIDE VACCINE  Completed  . HIV Screening  Completed  . PAP SMEAR  Excluded      Plan:  I have personally reviewed and addressed the Medicare Annual Wellness questionnaire and have noted the following in the patient's chart:  A. Medical and social history B. Use of alcohol, tobacco or illicit drugs  C. Current medications and supplements D. Functional ability and  status E.  Nutritional status F.  Physical activity G. Advance directives H. List of other physicians I.  Hospitalizations, surgeries, and ER visits in previous 12 months J.  [place] such as hearing and vision if needed, cognitive and depression L. Referrals and appointments - none  In addition, I have reviewed and discussed with patient certain preventive protocols, quality metrics, and best practice recommendations. A written personalized care plan for preventive services as well as general preventive health recommendations were provided to patient.  See attached scanned questionnaire for additional information.   Signed,  [name], LPN Nurse Health Advisor   MD Recommendations: None.
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
## 2017-02-01 ENCOUNTER — Ambulatory Visit: Payer: PPO | Admitting: Physician Assistant

## 2017-02-01 LAB — PLEASE NOTE

## 2017-02-01 LAB — HEPATITIS C ANTIBODY: Hep C Virus Ab: 0.2 s/co ratio (ref 0.0–0.9)

## 2017-02-05 ENCOUNTER — Ambulatory Visit (INDEPENDENT_AMBULATORY_CARE_PROVIDER_SITE_OTHER): Payer: PPO | Admitting: Family Medicine

## 2017-02-05 VITALS — BP 148/62 | HR 72 | Temp 97.6°F | Resp 16 | Wt 249.8 lb

## 2017-02-05 DIAGNOSIS — Z794 Long term (current) use of insulin: Secondary | ICD-10-CM | POA: Diagnosis not present

## 2017-02-05 DIAGNOSIS — I638 Other cerebral infarction: Secondary | ICD-10-CM

## 2017-02-05 DIAGNOSIS — I6389 Other cerebral infarction: Secondary | ICD-10-CM

## 2017-02-05 DIAGNOSIS — G8929 Other chronic pain: Secondary | ICD-10-CM | POA: Diagnosis not present

## 2017-02-05 DIAGNOSIS — M5126 Other intervertebral disc displacement, lumbar region: Secondary | ICD-10-CM | POA: Diagnosis not present

## 2017-02-05 DIAGNOSIS — M545 Low back pain, unspecified: Secondary | ICD-10-CM

## 2017-02-05 DIAGNOSIS — K529 Noninfective gastroenteritis and colitis, unspecified: Secondary | ICD-10-CM | POA: Diagnosis not present

## 2017-02-05 DIAGNOSIS — M48061 Spinal stenosis, lumbar region without neurogenic claudication: Secondary | ICD-10-CM

## 2017-02-05 DIAGNOSIS — E118 Type 2 diabetes mellitus with unspecified complications: Secondary | ICD-10-CM

## 2017-02-05 DIAGNOSIS — N3001 Acute cystitis with hematuria: Secondary | ICD-10-CM
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
## 2017-02-06 ENCOUNTER — Telehealth: Payer: Self-pay | Admitting: Family Medicine

## 2017-02-06 NOTE — Telephone Encounter (Signed)
Pt called saying she is still having a lot of diarrhea today.  She wants to know if tere is anything else she can do.  Her call is [phone]  Thanks teri

## 2017-02-06 NOTE — Telephone Encounter (Signed)
Please review-aa 

## 2017-02-06 NOTE — Telephone Encounter (Signed)
Liquids/Immodium AD for couple of days.May need stool studies if it persists.

## 2017-02-07 LAB — URINE CULTURE

## 2017-02-07 NOTE — Telephone Encounter (Signed)
Patient advised.

## 2017-02-12 ENCOUNTER — Ambulatory Visit
Admission: RE | Admit: 2017-02-12 | Discharge: 2017-02-12 | Disposition: A | Discharge status: Home / Self Care | Payer: PPO | Source: Ambulatory Visit | Attending: Family Medicine | Admitting: Family Medicine

## 2017-02-12 DIAGNOSIS — M5126 Other intervertebral disc displacement, lumbar region: Secondary | ICD-10-CM | POA: Diagnosis not present

## 2017-02-12 DIAGNOSIS — M545 Low back pain, unspecified: Secondary | ICD-10-CM

## 2017-02-12 DIAGNOSIS — M48061 Spinal stenosis, lumbar region without neurogenic claudication: Secondary | ICD-10-CM | POA: Diagnosis not present

## 2017-02-12 DIAGNOSIS — G8929 Other chronic pain: Secondary | ICD-10-CM | POA: Diagnosis not present

## 2017-02-12 DIAGNOSIS — M2578 Osteophyte, vertebrae: Secondary | ICD-10-CM | POA: Insufficient documentation

## 2017-02-13 ENCOUNTER — Other Ambulatory Visit: Payer: Self-pay | Admitting: *Deleted

## 2017-02-13 ENCOUNTER — Telehealth: Payer: Self-pay

## 2017-02-13 DIAGNOSIS — M5126 Other intervertebral disc displacement, lumbar region: Secondary | ICD-10-CM

## 2017-02-13 DIAGNOSIS — M5136 Other intervertebral disc degeneration, lumbar region: Secondary | ICD-10-CM

## 2017-02-13 LAB — POCT URINALYSIS DIPSTICK
BILIRUBIN UA: NEGATIVE
Glucose, UA: NEGATIVE
Ketones, UA: NEGATIVE
Nitrite, UA: NEGATIVE
Protein, UA: 2
SPEC GRAV UA: 1.02 (ref 1.010–1.025)
Urobilinogen, UA: 0.2 E.U./dL
pH, UA: 6.5 (ref 5.0–8.0)

## 2017-02-13 NOTE — Telephone Encounter (Signed)
-----   Message from Jerrol Banana., MD sent at 02/13/2017  3:05 PM EDT ----- Diffuse disease would try to refer to Dr Sharlet Salina.

## 2017-02-13 NOTE — Telephone Encounter (Signed)
Pt advised and agrees to referral. Order placed.

## 2017-02-13 NOTE — Patient Outreach (Signed)
HTA THN Screening call, unsuccessful and member did not have an answering machine.  l will try again within the week.  Eulah Pont. Myrtie Neither, MSN, [facility] Gerontological Nurse Practitioner [facility] Care Management [phone]

## 2017-02-15 ENCOUNTER — Other Ambulatory Visit: Payer: Self-pay | Admitting: *Deleted

## 2017-02-15 ENCOUNTER — Encounter: Payer: Self-pay | Admitting: *Deleted

## 2017-02-15 NOTE — Patient Outreach (Signed)
HTA THN Screen. Pt has been previously involved with [facility] care management. She had a nurse and pharmacy assistance. Pt sees primary care MD routinely for diabetes, HTN, CAD and chronic back pain. Takes medications as ordered for chronic problems. No acute health concerns at present. No care management needs. HTA letter sent.  Eulah Pont. Myrtie Neither, MSN, [facility] Gerontological Nurse Practitioner [facility] Care Management [phone]

## 2017-02-22 ENCOUNTER — Telehealth (INDEPENDENT_AMBULATORY_CARE_PROVIDER_SITE_OTHER): Payer: PPO | Admitting: Emergency Medicine

## 2017-02-22 DIAGNOSIS — N3001 Acute cystitis with hematuria: Secondary | ICD-10-CM

## 2017-02-22 LAB — POCT URINALYSIS DIPSTICK
Bilirubin, UA: NEGATIVE
Glucose, UA: NEGATIVE
Ketones, UA: NEGATIVE
NITRITE UA: NEGATIVE
PH UA: 6 (ref 5.0–8.0)
Protein, UA: 100
Spec Grav, UA: 1.02 (ref 1.010–1.025)
UROBILINOGEN UA: 0.2 U/dL
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
## 2017-02-22 NOTE — Telephone Encounter (Signed)
Pt advised, pt advised that when she brings the specimen to bring it on the side where [place] Plan is since we consulted with her about this issue and have her sign off on it-aa

## 2017-02-22 NOTE — Telephone Encounter (Signed)
Yes ok to get poct UA, if positive get culture

## 2017-02-22 NOTE — Telephone Encounter (Signed)
Pt called stating that Dr. Rosanna Randy told her that she could drop off a sample when she had UTI symptoms. She reports that she had a refill on her antibiotic that he usually gives her but she wants her urine checked before she takes it. Is it ok for her to drop the urine off for u/a? Please advise. Thanks

## 2017-02-24 LAB — URINE CULTURE

## 2017-03-06 DIAGNOSIS — D225 Melanocytic nevi of trunk: Secondary | ICD-10-CM | POA: Diagnosis not present

## 2017-03-06 DIAGNOSIS — D2272 Melanocytic nevi of left lower limb, including hip: Secondary | ICD-10-CM | POA: Diagnosis not present

## 2017-03-06 DIAGNOSIS — L2089 Other atopic dermatitis: Secondary | ICD-10-CM | POA: Diagnosis not present

## 2017-03-06 DIAGNOSIS — D2261 Melanocytic nevi of right upper limb, including shoulder: Secondary | ICD-10-CM | POA: Diagnosis not present

## 2017-03-07 DIAGNOSIS — M48062 Spinal stenosis, lumbar region with neurogenic claudication: Secondary | ICD-10-CM | POA: Diagnosis not present

## 2017-03-07 DIAGNOSIS — M47816 Spondylosis without myelopathy or radiculopathy, lumbar region: Secondary | ICD-10-CM | POA: Diagnosis not present

## 2017-03-07 DIAGNOSIS — M461 Sacroiliitis, not elsewhere classified: Secondary | ICD-10-CM | POA: Diagnosis not present

## 2017-03-09 IMAGING — US US EXTREM LOW VENOUS*R*
1 series · 13 of 24 positions shown · non-contrast
Comparison: None.

CLINICAL DATA: Right lower extremity pain for the past 14 hours.
Evaluate for DVT.



[Series 1: us extrem low venous*right* · 0.08mm/px · 13 of 34 slices shown]
[im 1/34]
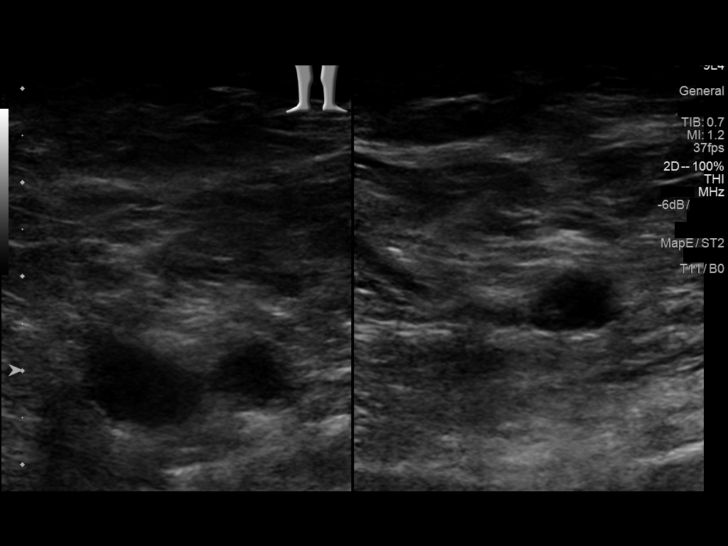
[im 3/34]
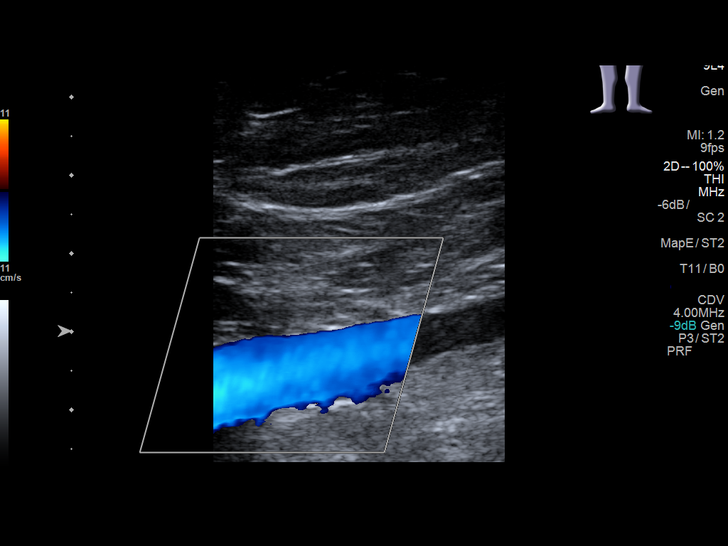
[im 6/34]
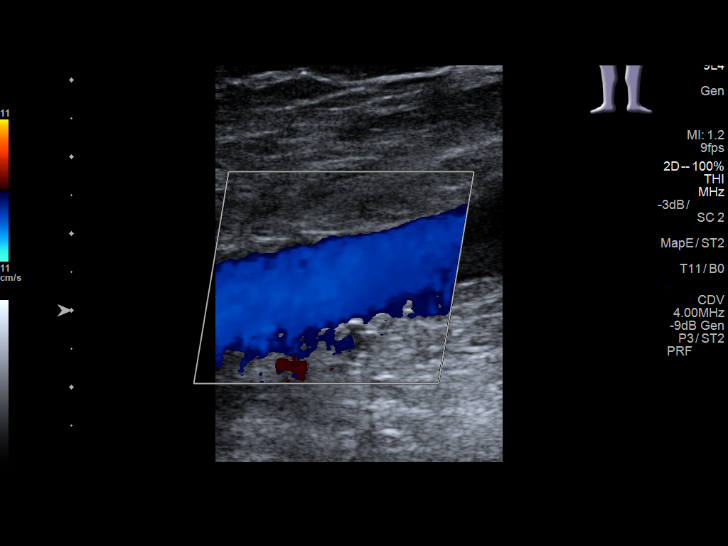
[im 9/34]
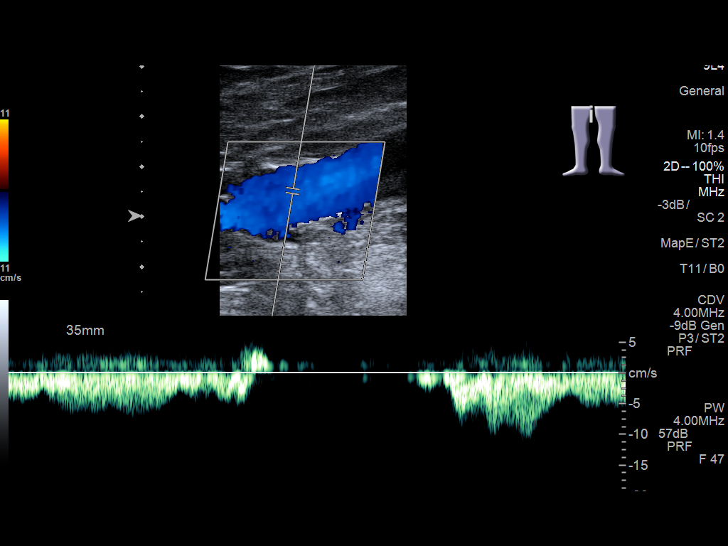
[im 12/34]
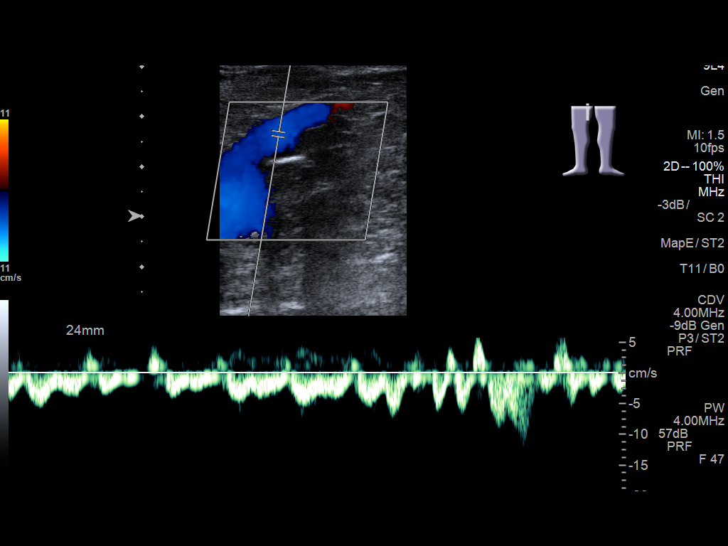
[im 15/34]
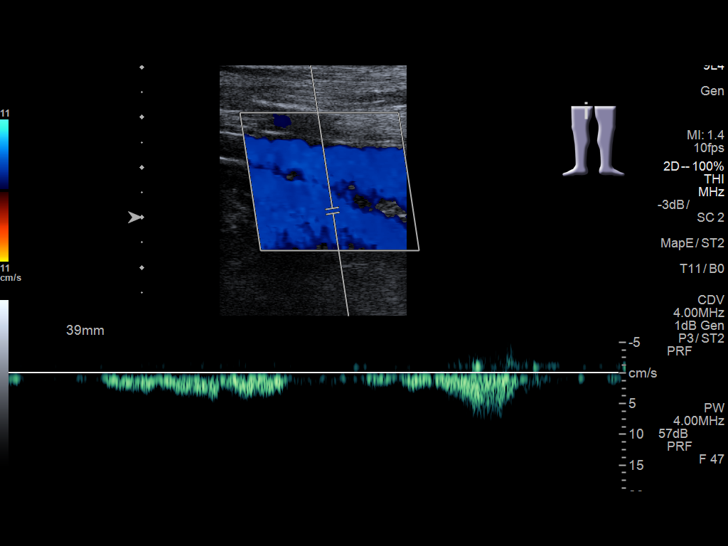
[im 18/34]
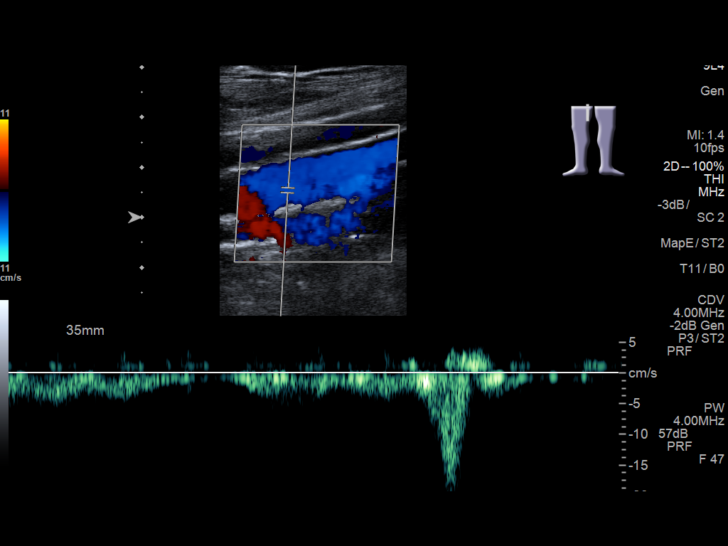
[im 19/34]
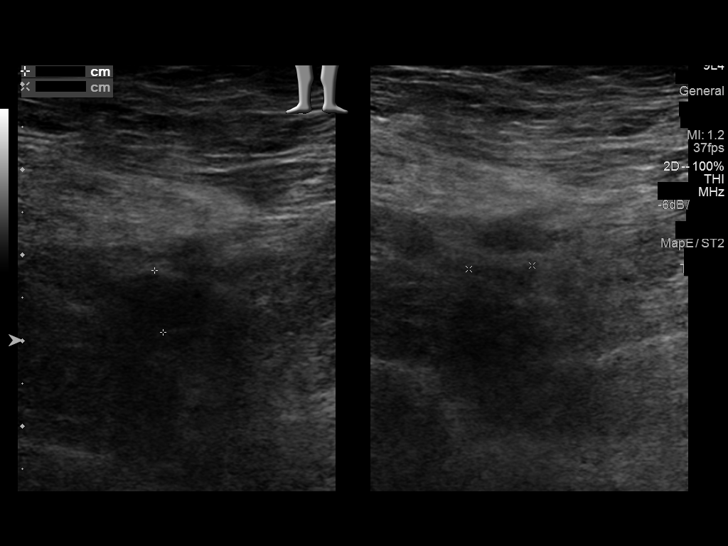
[im 22/34]
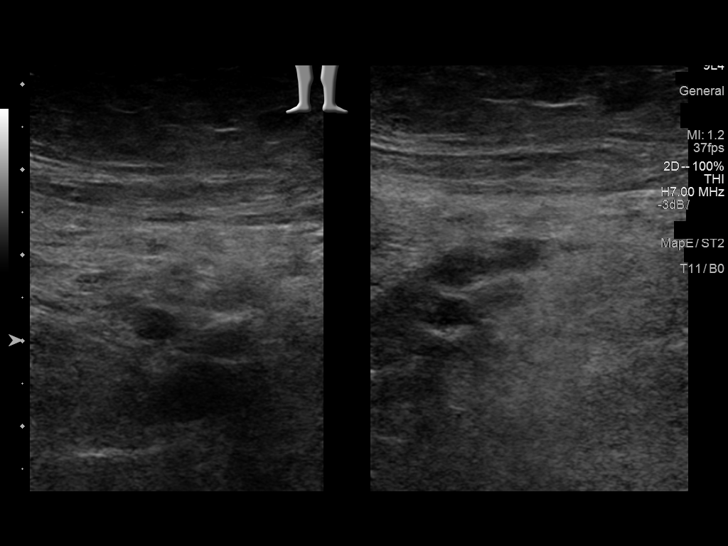
[im 25/34]
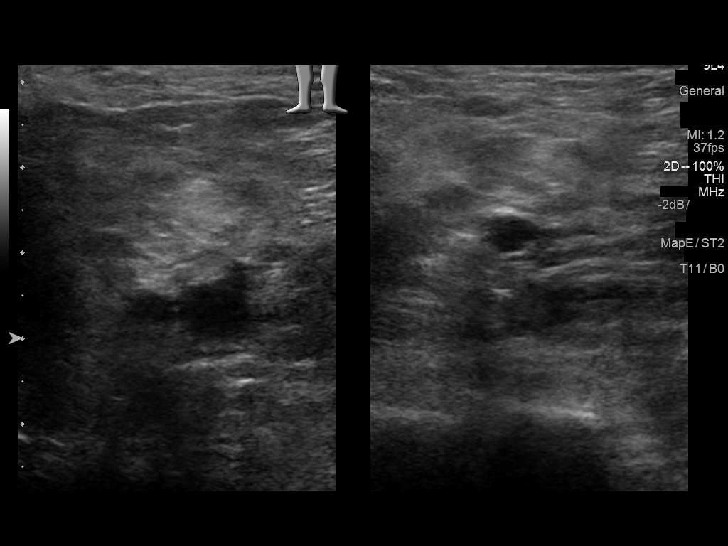
[im 28/34]
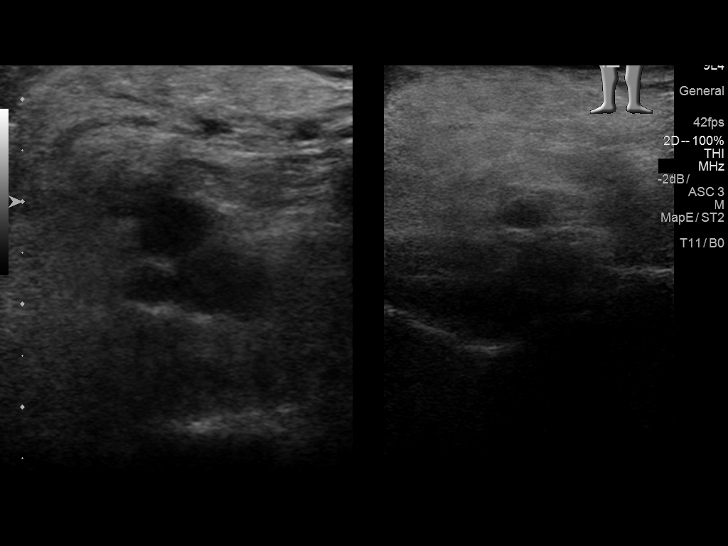
[im 31/34]
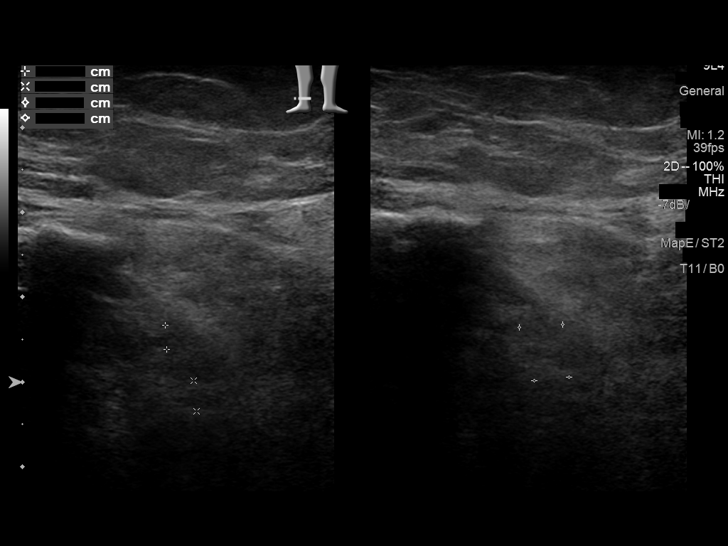
[im 34/34]
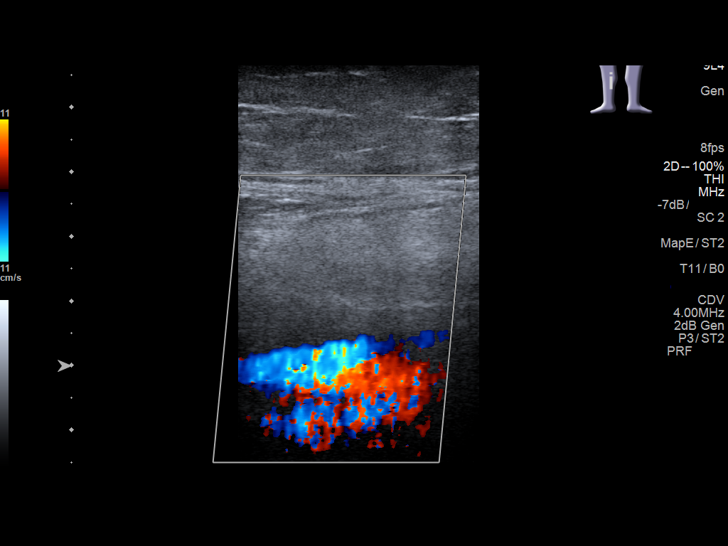

[13 of 24 positions shown; findings below may reference images not displayed]

FINDINGS: Contralateral Common Femoral Vein: Respiratory phasicity is normal
and symmetric with the symptomatic side. No evidence of thrombus.
Normal compressibility.

Common Femoral Vein: No evidence of thrombus. Normal
compressibility, respiratory phasicity and response to augmentation.

Saphenofemoral Junction: No evidence of thrombus. Normal
compressibility and flow on color Doppler imaging.

Profunda Femoral Vein: No evidence of thrombus. Normal
compressibility and flow on color Doppler imaging.

Femoral Vein: No evidence of thrombus. Normal compressibility,
respiratory phasicity and response to augmentation.

Popliteal Vein: No evidence of thrombus. Normal compressibility,
respiratory phasicity and response to augmentation.

Calf Veins: No evidence of thrombus. Normal compressibility and flow
on color Doppler imaging.

Superficial Great Saphenous Vein: No evidence of thrombus. Normal
compressibility and flow on color Doppler imaging.

Venous Reflux:  None.

Other Findings:  None.
IMPRESSION: No evidence of DVT within the right lower extremity.

## 2017-03-22 ENCOUNTER — Other Ambulatory Visit: Payer: Self-pay | Admitting: Family Medicine

## 2017-03-22 NOTE — Telephone Encounter (Signed)
Pt needs refill on her hydrocodone 10-325  Thanks teri

## 2017-03-26 MED ORDER — HYDROCODONE-ACETAMINOPHEN 10-325 MG PO TABS: 1.0000 | ORAL_TABLET | ORAL | 0 refills | Status: DC | PRN

## 2017-04-02 DIAGNOSIS — M461 Sacroiliitis, not elsewhere classified: Secondary | ICD-10-CM | POA: Diagnosis not present

## 2017-04-05 ENCOUNTER — Other Ambulatory Visit: Payer: Self-pay | Admitting: Family Medicine

## 2017-04-05 MED ORDER — ENALAPRIL MALEATE 20 MG PO TABS: 20.0000 mg | ORAL_TABLET | Freq: Two times a day (BID) | ORAL | 3 refills | Status: DC

## 2017-04-05 MED ORDER — METFORMIN HCL 1000 MG PO TABS: 1000.0000 mg | ORAL_TABLET | Freq: Two times a day (BID) | ORAL | 3 refills | Status: DC

## 2017-04-05 NOTE — Telephone Encounter (Signed)
Pt called needing refills on the   Enalapril 20mg  180 Metformin 1000 mg 180  [employer]

## 2017-04-18 ENCOUNTER — Telehealth: Payer: Self-pay | Admitting: Family Medicine

## 2017-04-18 ENCOUNTER — Ambulatory Visit (INDEPENDENT_AMBULATORY_CARE_PROVIDER_SITE_OTHER): Payer: PPO | Admitting: Family Medicine

## 2017-04-18 ENCOUNTER — Encounter: Payer: Self-pay | Admitting: Family Medicine

## 2017-04-18 VITALS — BP 162/70 | HR 74 | Temp 97.6°F | Resp 16

## 2017-04-18 DIAGNOSIS — H6690 Otitis media, unspecified, unspecified ear: Secondary | ICD-10-CM

## 2017-04-18 DIAGNOSIS — J0110 Acute frontal sinusitis, unspecified: Secondary | ICD-10-CM | POA: Diagnosis not present

## 2017-04-18 MED ORDER — HYDROCODONE-HOMATROPINE 5-1.5 MG/5ML PO SYRP: 5.0000 mL | ORAL_SOLUTION | ORAL | 0 refills | Status: DC | PRN

## 2017-04-18 MED ORDER — AMOXICILLIN-POT CLAVULANATE 875-125 MG PO TABS
1.0000 | ORAL_TABLET | Freq: Two times a day (BID) | ORAL | 0 refills | Status: DC
Start: 2017-04-18 — End: 2017-05-01

## 2017-04-18 NOTE — Telephone Encounter (Signed)
Hycodin--5cc po q 4 hours prn,120cc.  thx

## 2017-04-18 NOTE — Telephone Encounter (Signed)
Pt states she was seen today and is requesting a Rx to help with her cough.  Hyman Hopes.  UV#[phone]

## 2017-04-18 NOTE — Telephone Encounter (Signed)
RX printed. Patient advised.

## 2017-04-18 NOTE — Progress Notes (Signed)
Patient: Susan House Female    DOB: Oct 29, 1955   61 y.o.   MRN: [id number] Visit Date: 04/18/2017  Today's Provider: Wilhemena Durie, MD   Chief Complaint  Patient presents with  . URI   Subjective:    HPI Pt is here today for a URI. She reports that it started on Sunday (3 days ago). She has sinus congestion, cough, headache, chills (started today), left ear tenderness, diarrhea, nausea, vomiting (once). She reports that she had on episode of vomiting and has had diarrhea and nausea since this started.       Allergies  Allergen Reactions  . Morphine And Related Anaphylaxis  . Fentanyl Other (See Comments)    Unknown reaction.  . Reglan [Metoclopramide]     Elevated BP  . Simvastatin Other (See Comments)    Muscle pain  . Betadine [Povidone Iodine] Rash  . Iodine Rash  . Tetracyclines & Related Rash     Current Outpatient Prescriptions:  .  ACCU-CHEK AVIVA PLUS test strip, TEST BLOOD SUGAR THREE TIMES DAILY, Disp: 300 each, Rfl: 3 .  ACCU-CHEK SOFTCLIX LANCETS lancets, CHECK BLOOD SUGAR THREE TIMES DAILY, Disp: 300 each, Rfl: 3 .  amLODipine (NORVASC) 10 MG tablet, Take 10 mg by mouth daily., Disp: , Rfl:  .  aspirin 81 MG EC tablet, Take 1 tablet (81 mg total) by mouth daily. Swallow whole., Disp: 30 tablet, Rfl: 12 .  atorvastatin (LIPITOR) 40 MG tablet, Take 0.5 tablets (20 mg total) by mouth at bedtime., Disp: 90 tablet, Rfl: 3 .  Blood Glucose Monitoring Suppl (ACCU-CHEK AVIVA PLUS) w/Device KIT, Check sugar three times daily DX E11.9-needs a meter, Disp: 1 kit, Rfl: 0 .  enalapril (VASOTEC) 20 MG tablet, Take 1 tablet (20 mg total) by mouth 2 (two) times daily., Disp: 180 tablet, Rfl: 3 .  HYDROcodone-acetaminophen (NORCO) 10-325 MG tablet, Take 1-2 tablets by mouth every 4 (four) hours as needed., Disp: 60 tablet, Rfl: 0 .  insulin aspart protamine- aspart (NOVOLOG MIX 70/30) (70-30) 100 UNIT/ML injection, Inject 25-30 Units into the skin 2 (two)  times daily. , Disp: , Rfl:  .  Lancet Devices (ACCU-CHEK SOFTCLIX) lancets, Check sugar three times daily DX E11.9, Disp: 100 each, Rfl: 3 .  levETIRAcetam (KEPPRA) 500 MG tablet, Take 1 tablet (500 mg total) by mouth 2 (two) times daily. 500 mg bid., Disp: 180 tablet, Rfl: 3 .  metFORMIN (GLUCOPHAGE) 1000 MG tablet, Take 1 tablet (1,000 mg total) by mouth 2 (two) times daily., Disp: 180 tablet, Rfl: 3 .  metoprolol succinate (TOPROL-XL) 100 MG 24 hr tablet, Take 1 tablet (100 mg total) by mouth daily., Disp: 90 tablet, Rfl: 3 .  nitroGLYCERIN (NITROSTAT) 0.4 MG SL tablet, Place 1 tablet (0.4 mg total) under the tongue every 5 (five) minutes as needed for chest pain., Disp: 25 tablet, Rfl: 0 .  oxybutynin (DITROPAN) 5 MG tablet, Take 1 tablet (5 mg total) by mouth 3 (three) times daily., Disp: 270 tablet, Rfl: 3 .  pantoprazole (PROTONIX) 40 MG tablet, Take 1 tablet (40 mg total) by mouth daily., Disp: 90 tablet, Rfl: 3 .  triamcinolone cream (KENALOG) 0.1 %, Can use thin layer on affected areas 2x daily for one week at a time. Not for face., Disp: 30 g, Rfl: 0 .  diphenoxylate-atropine (LOMOTIL) 2.5-0.025 MG tablet, 2 tablets initially and then 1 tablet every 6 hours as needed for diarrhea (Patient not taking: Reported on 02/15/2017),  Disp: 60 tablet, Rfl: 1  Review of Systems  Constitutional: Positive for chills and fatigue.  HENT: Positive for congestion, ear pain and sneezing.   Eyes: Negative.   Respiratory: Positive for cough.   Cardiovascular: Negative.   Gastrointestinal: Positive for diarrhea, nausea and vomiting.  Endocrine: Negative.   Genitourinary: Negative.   Musculoskeletal: Negative.   Skin: Negative.   Allergic/Immunologic: Negative.   Neurological: Positive for headaches.  Hematological: Negative.   Psychiatric/Behavioral: Negative.     Social History  Substance Use Topics  . Smoking status: Never Smoker  . Smokeless tobacco: Never Used  . Alcohol use No    Objective:   BP (!) 162/70 (BP Location: Right Arm, Patient Position: Sitting, Cuff Size: Normal)   Pulse 74   Temp 97.6 F (36.4 C) (Oral)   Resp 16   SpO2 98%  Vitals:   04/18/17 1409  BP: (!) 162/70  Pulse: 74  Resp: 16  Temp: 97.6 F (36.4 C)  TempSrc: Oral  SpO2: 98%     Physical Exam  Constitutional: She is oriented to person, place, and time. She appears well-developed and well-nourished.  HENT:  Head: Normocephalic.  Right Ear: External ear normal.  Mouth/Throat: Oropharynx is clear and moist.  Mild otis media of the left, frontal sinus tenderness  Eyes: Pupils are equal, round, and reactive to light. Conjunctivae and EOM are normal.  Neck: Normal range of motion. Neck supple.  Cardiovascular: Normal rate, regular rhythm, normal heart sounds and intact distal pulses.   Pulmonary/Chest: Effort normal and breath sounds normal.  Musculoskeletal: Normal range of motion.  Neurological: She is alert and oriented to person, place, and time. She has normal reflexes.  Skin: Skin is warm and dry.  Psychiatric: She has a normal mood and affect. Her behavior is normal. Judgment and thought content normal.        Assessment & Plan:     1. Acute otitis media, unspecified otitis media type  - amoxicillin-clavulanate (AUGMENTIN) 875-125 MG tablet; Take 1 tablet by mouth 2 (two) times daily.  Dispense: 20 tablet; Refill: 0  2. Acute frontal sinusitis, recurrence not specified  - amoxicillin-clavulanate (AUGMENTIN) 875-125 MG tablet; Take 1 tablet by mouth 2 (two) times daily.  Dispense: 20 tablet; Refill: 0     HPI, Exam, and A&P Transcribed under the direction and in the presence of Richard L. Cranford Mon, MD  Electronically Signed: Katina Dung, CMA  I have done the exam and reviewed the above chart and it is accurate to the best of my knowledge. Development worker, community has been used in this note in any air is in the dictation or transcription are  unintentional.  Wilhemena Durie, MD  [place]

## 2017-04-25 DIAGNOSIS — B351 Tinea unguium: Secondary | ICD-10-CM | POA: Diagnosis not present

## 2017-04-25 DIAGNOSIS — E119 Type 2 diabetes mellitus without complications: Secondary | ICD-10-CM | POA: Diagnosis not present

## 2017-04-25 DIAGNOSIS — L851 Acquired keratosis [keratoderma] palmaris et plantaris: Secondary | ICD-10-CM | POA: Diagnosis not present

## 2017-05-01 ENCOUNTER — Ambulatory Visit (INDEPENDENT_AMBULATORY_CARE_PROVIDER_SITE_OTHER): Payer: PPO | Admitting: Family Medicine

## 2017-05-01 ENCOUNTER — Encounter: Payer: Self-pay | Admitting: Family Medicine

## 2017-05-01 VITALS — BP 136/72 | HR 65 | Temp 98.1°F

## 2017-05-01 DIAGNOSIS — J0140 Acute pansinusitis, unspecified: Secondary | ICD-10-CM | POA: Diagnosis not present

## 2017-05-01 DIAGNOSIS — I639 Cerebral infarction, unspecified: Secondary | ICD-10-CM | POA: Insufficient documentation

## 2017-05-01 MED ORDER — CEFDINIR 300 MG PO CAPS: 300.0000 mg | ORAL_CAPSULE | Freq: Two times a day (BID) | ORAL | 0 refills | Status: DC

## 2017-05-01 NOTE — Progress Notes (Signed)
Patient: Susan House Female    DOB: [birth year]   61 y.o.   MRN: [id number] Visit Date: 05/01/2017  Today's Provider: Vernie Murders, PA   Chief Complaint  Patient presents with  . URI   Subjective:    URI   This is a recurrent problem. The current episode started 1 to 4 weeks ago. The problem has been gradually worsening. Associated symptoms include congestion, coughing and a sore throat. Treatments tried: Antibiotics prescribed  The treatment provided no relief.   Past Medical History:  Diagnosis Date  . Allergy   . Arthritis   . Cellulitis and abscess of face   . Depression   . Diabetes mellitus ([place])   . Edema   . GERD (gastroesophageal reflux disease)   . Hematuria   . History of echocardiogram    2008: normal, 2011: normal LVSF  . History of nuclear stress test    a. 12/2009: lexiscan - negative  . Hyperlipidemia   . Hypertension   . IBS (irritable bowel syndrome)   . Migraine   . Morbid obesity ([place])   . Nocturia   . Stroke ([place]) 2000  . TIA (transient ischemic attack) 2010  . Urgency of micturation   . Urinary frequency   . Urinary incontinence    Past Surgical History:  Procedure Laterality Date  . ABDOMINAL HYSTERECTOMY    . ABDOMINOPLASTY    . APPENDECTOMY    . BACK SURGERY     extensive spinal fusion  . carpel tunnel surgery    . CATARACT EXTRACTION     x 2  . CESAREAN SECTION    . CHOLECYSTECTOMY    . COLONOSCOPY  2010  . COLONOSCOPY WITH PROPOFOL N/A 07/11/2016   Procedure: COLONOSCOPY WITH PROPOFOL;  Surgeon: Lucilla Lame, MD;  Location: [facility] ENDOSCOPY;  Service: Endoscopy;  Laterality: N/A;  . EYE SURGERY    . HERNIA REPAIR    . SHOULDER SURGERY    . TOE SURGERY    . UPPER GI ENDOSCOPY    . WRIST FRACTURE SURGERY Left    Family History  Problem Relation Age of Onset  . Hyperlipidemia Mother   . Arrhythmia Mother        WPW  . Hypertension Mother   . Rheum arthritis Mother   . Heart disease Mother   . Heart attack Father 60   . Hyperlipidemia Father   . Hypertension Father   . Stroke Father   . Diabetes Father   . Heart disease Father   . Coronary artery disease Father   . Diabetes Sister   . Hyperlipidemia Sister   . Hypertension Sister   . Depression Sister   . Depression Sister   . Diabetes Sister   . Hypertension Sister   . Hyperlipidemia Sister   . Kidney disease Paternal Grandmother    Allergies  Allergen Reactions  . Morphine And Related Anaphylaxis  . Fentanyl Other (See Comments)    Unknown reaction.  . Reglan [Metoclopramide]     Other reaction(s): Unknown Elevated BP  . Simvastatin Other (See Comments)    Muscle pain  . Betadine [Povidone Iodine] Rash  . Iodine Rash    Other reaction(s): Unknown  . Tetracyclines & Related Rash    Other reaction(s): Unknown     Previous Medications   ACCU-CHEK AVIVA PLUS TEST STRIP    TEST BLOOD SUGAR THREE TIMES DAILY   ACCU-CHEK SOFTCLIX LANCETS LANCETS    CHECK BLOOD SUGAR THREE TIMES DAILY  AMLODIPINE (NORVASC) 10 MG TABLET    Take 10 mg by mouth daily.   AMOXICILLIN-CLAVULANATE (AUGMENTIN) 875-125 MG TABLET    Take 1 tablet by mouth 2 (two) times daily.   ASPIRIN 81 MG EC TABLET    Take 1 tablet (81 mg total) by mouth daily. Swallow whole.   ATORVASTATIN (LIPITOR) 40 MG TABLET    Take 0.5 tablets (20 mg total) by mouth at bedtime.   BLOOD GLUCOSE MONITORING SUPPL (ACCU-CHEK AVIVA PLUS) W/DEVICE KIT    Check sugar three times daily DX E11.9-needs a meter   DIPHENOXYLATE-ATROPINE (LOMOTIL) 2.5-0.025 MG TABLET    2 tablets initially and then 1 tablet every 6 hours as needed for diarrhea   ENALAPRIL (VASOTEC) 20 MG TABLET    Take 1 tablet (20 mg total) by mouth 2 (two) times daily.   HYDROCODONE-ACETAMINOPHEN (NORCO) 10-325 MG TABLET    Take 1-2 tablets by mouth every 4 (four) hours as needed.   HYDROCODONE-HOMATROPINE (HYCODAN) 5-1.5 MG/5ML SYRUP    Take 5 mLs by mouth every 4 (four) hours as needed for cough.   INSULIN ASPART PROTAMINE- ASPART  (NOVOLOG MIX 70/30) (70-30) 100 UNIT/ML INJECTION    Inject 25-30 Units into the skin 2 (two) times daily.    LANCET DEVICES (ACCU-CHEK SOFTCLIX) LANCETS    Check sugar three times daily DX E11.9   LEVETIRACETAM (KEPPRA) 500 MG TABLET    Take 1 tablet (500 mg total) by mouth 2 (two) times daily. 500 mg bid.   METFORMIN (GLUCOPHAGE) 1000 MG TABLET    Take 1 tablet (1,000 mg total) by mouth 2 (two) times daily.   METOPROLOL SUCCINATE (TOPROL-XL) 100 MG 24 HR TABLET    Take 1 tablet (100 mg total) by mouth daily.   NITROGLYCERIN (NITROSTAT) 0.4 MG SL TABLET    Place 1 tablet (0.4 mg total) under the tongue every 5 (five) minutes as needed for chest pain.   OXYBUTYNIN (DITROPAN) 5 MG TABLET    Take 1 tablet (5 mg total) by mouth 3 (three) times daily.   PANTOPRAZOLE (PROTONIX) 40 MG TABLET    Take 1 tablet (40 mg total) by mouth daily.   TRIAMCINOLONE CREAM (KENALOG) 0.1 %    Can use thin layer on affected areas 2x daily for one week at a time. Not for face.    Review of Systems  Constitutional: Negative.   HENT: Positive for congestion and sore throat.   Respiratory: Positive for cough.   Cardiovascular: Negative.     Social History  Substance Use Topics  . Smoking status: Never Smoker  . Smokeless tobacco: Never Used  . Alcohol use No   Objective:   BP 136/72 (BP Location: Right Arm, Patient Position: Sitting, Cuff Size: Normal)   Pulse 65   Temp 98.1 F (36.7 C) (Oral)   SpO2 97%   Physical Exam  Constitutional: She is oriented to person, place, and time. She appears well-developed and well-nourished. No distress.  HENT:  Head: Normocephalic and atraumatic.  Right Ear: Hearing and external ear normal.  Left Ear: Hearing and external ear normal.  Nose: Nose normal.  Slightly reddened posterior pharynx without exudates. Maxillary and frontal sinuses are tender but transilluminate well.  Eyes: Conjunctivae and lids are normal. Right eye exhibits no discharge. Left eye exhibits no  discharge. No scleral icterus.  Neck: Neck supple.  Cardiovascular: Normal rate and regular rhythm.   Pulmonary/Chest: Effort normal and breath sounds normal. No respiratory distress.  Abdominal: Soft. Bowel sounds  are normal.  Lymphadenopathy:    She has no cervical adenopathy.  Neurological: She is alert and oriented to person, place, and time.  Skin: Skin is intact. No lesion and no rash noted.  Psychiatric: She has a normal mood and affect. Her speech is normal and behavior is normal. Thought content normal.      Assessment & Plan:     1. Subacute pansinusitis No longer having earache. Continues to have sinus tenderness and PND with cough. No sputum production but having a scratchy sore throat. States she finished the Augmentin 5 days ago and was sure the prescription was "not at the dose prescribed". Still has some of the Hycodan for cough at night. Recommend switching antibiotic to the Omnicef BID and using Mucinex-DM for daytime cough. Increase fluid intake and try to maintain a balanced diet to keep diabetes under control. Keep follow up appointment with Dr. Rosanna Randy on 05-07-17. - cefdinir (OMNICEF) 300 MG capsule; Take 1 capsule (300 mg total) by mouth 2 (two) times daily.  Dispense: 20 capsule; Refill: 0

## 2017-05-07 ENCOUNTER — Ambulatory Visit (INDEPENDENT_AMBULATORY_CARE_PROVIDER_SITE_OTHER): Payer: PPO | Admitting: Family Medicine

## 2017-05-07 VITALS — BP 130/70 | HR 76 | Temp 98.3°F | Resp 16 | Wt 241.0 lb

## 2017-05-07 DIAGNOSIS — Z794 Long term (current) use of insulin: Secondary | ICD-10-CM | POA: Diagnosis not present

## 2017-05-07 DIAGNOSIS — E1142 Type 2 diabetes mellitus with diabetic polyneuropathy: Secondary | ICD-10-CM

## 2017-05-07 LAB — POCT GLYCOSYLATED HEMOGLOBIN (HGB A1C): HEMOGLOBIN A1C: 7.3
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
## 2017-05-10 ENCOUNTER — Encounter: Payer: Self-pay | Admitting: Family Medicine

## 2017-05-10 DIAGNOSIS — Z1231 Encounter for screening mammogram for malignant neoplasm of breast: Secondary | ICD-10-CM | POA: Diagnosis not present

## 2017-05-18 ENCOUNTER — Ambulatory Visit: Payer: Self-pay | Admitting: Urology

## 2017-05-18 ENCOUNTER — Encounter: Payer: Self-pay | Admitting: Urology

## 2017-05-20 IMAGING — CR DG CHEST 2V
2 series · 2 of 2 positions shown · non-contrast
Comparison: Chest radiograph performed 06/17/2014

CLINICAL DATA: Acute onset of generalized chest pain. Initial
encounter.

EXAM:
CHEST  2 VIEW

[chest pa]
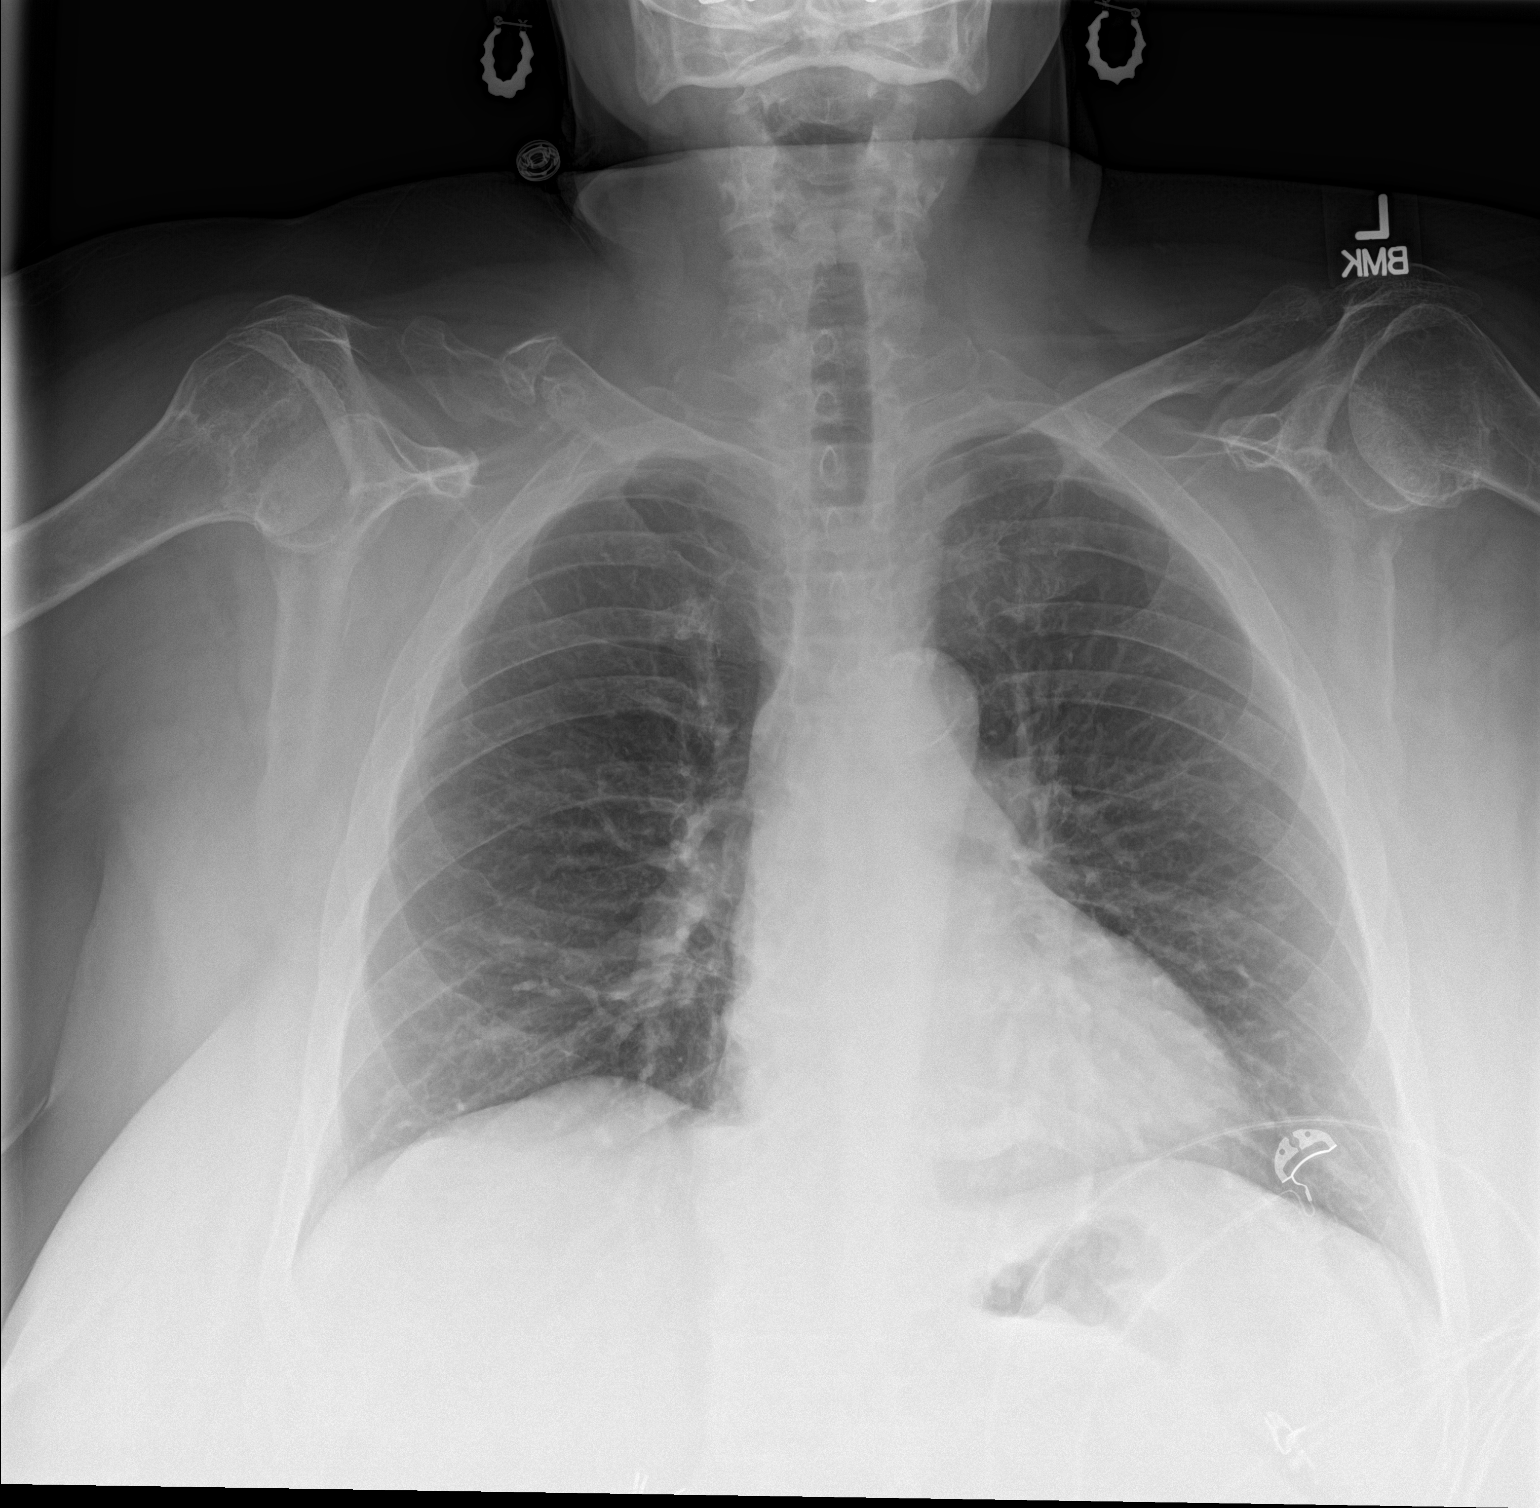

[chest lat]
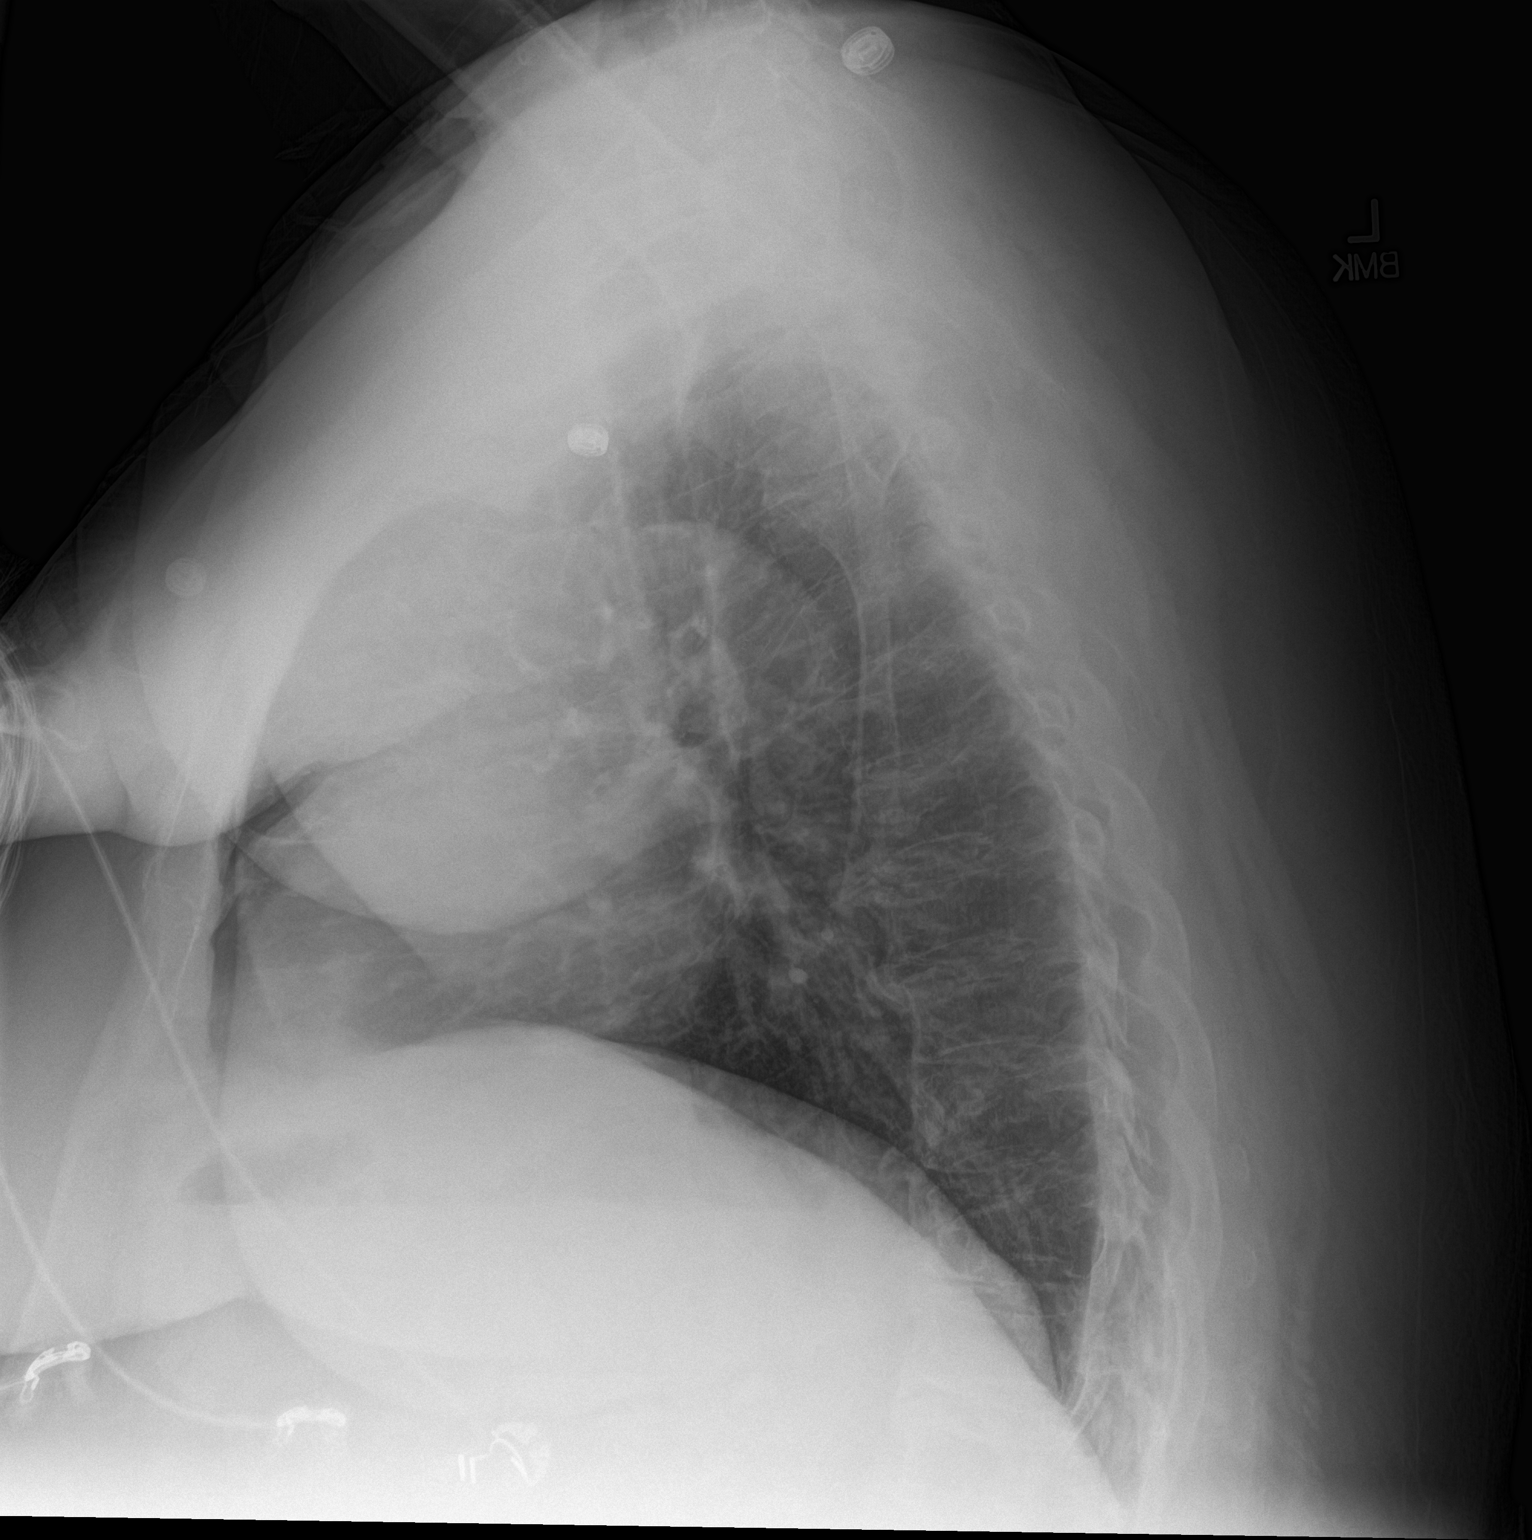

[2 of 2 positions shown; findings below may reference images not displayed]

FINDINGS: The lungs are well-aerated and clear. There is no evidence of focal
opacification, pleural effusion or pneumothorax.

The heart is normal in size; the mediastinal contour is within
normal limits. No acute osseous abnormalities are seen. There is
chronic deformity involving the distal left clavicle, with
underlying chronic displaced fracture.
IMPRESSION: No acute cardiopulmonary process seen.

## 2017-05-21 DIAGNOSIS — M47816 Spondylosis without myelopathy or radiculopathy, lumbar region: Secondary | ICD-10-CM | POA: Diagnosis not present

## 2017-05-21 DIAGNOSIS — M48062 Spinal stenosis, lumbar region with neurogenic claudication: Secondary | ICD-10-CM | POA: Diagnosis not present

## 2017-05-21 DIAGNOSIS — M461 Sacroiliitis, not elsewhere classified: Secondary | ICD-10-CM | POA: Diagnosis not present

## 2017-05-24 ENCOUNTER — Ambulatory Visit (INDEPENDENT_AMBULATORY_CARE_PROVIDER_SITE_OTHER): Payer: PPO

## 2017-05-24 DIAGNOSIS — Z23 Encounter for immunization: Secondary | ICD-10-CM

## 2017-05-25 ENCOUNTER — Other Ambulatory Visit: Payer: Self-pay | Admitting: Family Medicine

## 2017-05-25 ENCOUNTER — Encounter: Payer: Self-pay | Admitting: Family Medicine

## 2017-05-25 ENCOUNTER — Ambulatory Visit: Payer: PPO | Admitting: Family Medicine

## 2017-05-25 VITALS — BP 142/82 | HR 50 | Temp 98.4°F | Resp 16

## 2017-05-25 DIAGNOSIS — J069 Acute upper respiratory infection, unspecified: Secondary | ICD-10-CM | POA: Diagnosis not present

## 2017-05-25 MED ORDER — HYDROCODONE-HOMATROPINE 5-1.5 MG/5ML PO SYRP: ORAL_SOLUTION | ORAL | 0 refills | Status: DC

## 2017-05-25 NOTE — Progress Notes (Signed)
Subjective:     Patient ID: Susan House, female   DOB: 26-Aug-1955, 61 y.o.   MRN: [id number]  HPI  Chief Complaint  Patient presents with  . Cough    Patient comes in office today with complaints of cough and congestion x 1 week that has gradually been getting worse. Patient reports post nasal drainage and congestion, she has been taking otc Tylenol.   Reports residual dry cough after treatment of sinus infection in October. Received her flu shot yesterday.States in the last 24 hours has developed cold sx with sore throat, sinus congestion, and PND with accompanying increased cough. Accompanied by her husband today.   Review of Systems     Objective:   Physical Exam  Constitutional: She appears well-developed and well-nourished. No distress.  Ears: T.M's intact without inflammation Sinuses: mild maxillary sinus tenderness Throat: no tonsillar enlargement or exudate Neck: no cervical adenopathy Lungs: clear     Assessment:    1. Viral upper respiratory tract infection: refilled hydrocodone cough syrup.    Plan:    Discussed use of Coricidin, Mucinex and saline nasal spray.

## 2017-05-25 NOTE — Patient Instructions (Signed)
Discussed use of Coricidin, Mucinex, and salt water nasal spray.

## 2017-06-01 DIAGNOSIS — H1033 Unspecified acute conjunctivitis, bilateral: Secondary | ICD-10-CM | POA: Diagnosis not present

## 2017-06-06 DIAGNOSIS — M79672 Pain in left foot: Secondary | ICD-10-CM | POA: Diagnosis not present

## 2017-06-06 DIAGNOSIS — M79675 Pain in left toe(s): Secondary | ICD-10-CM | POA: Diagnosis not present

## 2017-06-16 ENCOUNTER — Encounter: Payer: Self-pay | Admitting: Family Medicine

## 2017-06-16 ENCOUNTER — Ambulatory Visit (INDEPENDENT_AMBULATORY_CARE_PROVIDER_SITE_OTHER): Payer: PPO | Admitting: Family Medicine

## 2017-06-16 VITALS — BP 148/60 | HR 79 | Temp 98.2°F | Resp 16

## 2017-06-16 DIAGNOSIS — H7092 Unspecified mastoiditis, left ear: Secondary | ICD-10-CM

## 2017-06-16 MED ORDER — AMOXICILLIN-POT CLAVULANATE 875-125 MG PO TABS: 1.0000 | ORAL_TABLET | Freq: Two times a day (BID) | ORAL | 0 refills | Status: DC

## 2017-06-16 NOTE — Patient Instructions (Signed)
F/u on Monday of symptoms not improving.Go to the ER if fever,chills or worsening pain over the weekend.

## 2017-06-16 NOTE — Progress Notes (Signed)
Subjective:     Patient ID: Susan House, female   DOB: 12/04/1955, 61 y.o.   MRN: [id number] Chief Complaint  Patient presents with  . Ear Pain    Patient is here today with c/o ear pain, left. She reports she just woke up with this today. She reports that she has had URI symptoms for 3 week. Treatment:Hycodan and eye drops that were given by [facility] for eye infection. Patient report no chest pain, fever, chest tightness,SOB or wheezing.  States cold symptoms from 11/8 have left her with an occasional dry cough. Accompanied by her husband today. HPI   Review of Systems     Objective:   Physical Exam  Constitutional: She appears well-developed and well-nourished. No distress.  HENT:  R TM partially obscured by cerumen. Left TM intact without inflammation. She has mild tenderness on palpation of her external ear but is moderately tender over her mastoid. No overlying erythema.  Pulmonary/Chest: Breath sounds normal.       Assessment:    1. Mastoiditis of left side: rx for Augmentin    Plan:    F/u in two days if not improving. Report to the ER if worsening over the weekend.

## 2017-06-18 ENCOUNTER — Other Ambulatory Visit: Payer: Self-pay | Admitting: Family Medicine

## 2017-06-18 ENCOUNTER — Other Ambulatory Visit: Payer: Self-pay | Admitting: Neurology

## 2017-06-18 ENCOUNTER — Ambulatory Visit: Payer: Self-pay | Admitting: Family Medicine

## 2017-06-18 DIAGNOSIS — K219 Gastro-esophageal reflux disease without esophagitis: Secondary | ICD-10-CM

## 2017-06-18 DIAGNOSIS — G40909 Epilepsy, unspecified, not intractable, without status epilepticus: Secondary | ICD-10-CM

## 2017-06-21 ENCOUNTER — Other Ambulatory Visit: Payer: Self-pay | Admitting: Family Medicine

## 2017-06-21 DIAGNOSIS — E113553 Type 2 diabetes mellitus with stable proliferative diabetic retinopathy, bilateral: Secondary | ICD-10-CM | POA: Diagnosis not present

## 2017-06-21 NOTE — Telephone Encounter (Signed)
Pt called for refill for the hydrocodone 10 mg.  Pt's call back is [phone] or [phone]  Thanks teri

## 2017-06-22 NOTE — Telephone Encounter (Signed)
Pt called to see if Rx was ready. Pt was advised Dr. Rosanna Randy is out of the office today. Can another provider write the Rx? Please advise. Thanks TNP

## 2017-06-23 MED ORDER — HYDROCODONE-ACETAMINOPHEN 10-325 MG PO TABS: 1.0000 | ORAL_TABLET | ORAL | 0 refills | Status: DC | PRN

## 2017-06-23 NOTE — Telephone Encounter (Signed)
Attempted to contact patient to advise her that RX has been sent to pharmacy. No answer and no voicemail

## 2017-06-23 NOTE — Telephone Encounter (Signed)
Dr. Alben Spittle patient. She called yesterday to see if another provider could write RX. Last RF 05/25/17 and last OV 06/16/17

## 2017-06-27 ENCOUNTER — Telehealth: Payer: Self-pay | Admitting: Family Medicine

## 2017-06-27 NOTE — Telephone Encounter (Signed)
Dr. Caryn Section sent RX electronically on 06/23/17

## 2017-06-27 NOTE — Telephone Encounter (Signed)
Pt needs refill on her hyrdrocodone 10-325.  She said she called last week and hasnt heard anything back  Her call back is [phone]  Thanks teri

## 2017-06-27 NOTE — Telephone Encounter (Signed)
Ok to rf,

## 2017-06-27 NOTE — Telephone Encounter (Signed)
Pt advised-Youa Deloney V Melany Wiesman, RMA  

## 2017-06-27 NOTE — Telephone Encounter (Signed)
This was done electronically on 06/23/17.

## 2017-06-29 ENCOUNTER — Encounter: Payer: Self-pay | Admitting: Family Medicine

## 2017-07-03 ENCOUNTER — Ambulatory Visit: Payer: PPO | Admitting: Family Medicine

## 2017-07-04 ENCOUNTER — Ambulatory Visit: Payer: PPO | Admitting: Neurology

## 2017-07-04 ENCOUNTER — Encounter: Payer: Self-pay | Admitting: Neurology

## 2017-07-04 VITALS — BP 173/78 | HR 70 | Ht 62.0 in | Wt 239.0 lb

## 2017-07-04 DIAGNOSIS — I6350 Cerebral infarction due to unspecified occlusion or stenosis of unspecified cerebral artery: Secondary | ICD-10-CM | POA: Diagnosis not present

## 2017-07-04 DIAGNOSIS — G40909 Epilepsy, unspecified, not intractable, without status epilepticus: Secondary | ICD-10-CM | POA: Diagnosis not present

## 2017-07-04 DIAGNOSIS — I739 Peripheral vascular disease, unspecified: Secondary | ICD-10-CM

## 2017-07-04 DIAGNOSIS — Z79899 Other long term (current) drug therapy: Secondary | ICD-10-CM | POA: Diagnosis not present

## 2017-07-04 DIAGNOSIS — Z8673 Personal history of transient ischemic attack (TIA), and cerebral infarction without residual deficits: Secondary | ICD-10-CM | POA: Diagnosis not present

## 2017-07-04 DIAGNOSIS — G9389 Other specified disorders of brain: Secondary | ICD-10-CM | POA: Insufficient documentation

## 2017-07-04 DIAGNOSIS — I63512 Cerebral infarction due to unspecified occlusion or stenosis of left middle cerebral artery: Secondary | ICD-10-CM

## 2017-07-04 DIAGNOSIS — I639 Cerebral infarction, unspecified: Secondary | ICD-10-CM | POA: Insufficient documentation

## 2017-07-04 MED ORDER — ACYCLOVIR 5 % EX OINT: 1.0000 "application " | TOPICAL_OINTMENT | CUTANEOUS | 1 refills | Status: DC

## 2017-07-04 MED ORDER — LEVETIRACETAM 500 MG PO TABS: 500.0000 mg | ORAL_TABLET | Freq: Two times a day (BID) | ORAL | 3 refills | Status: DC

## 2017-07-04 NOTE — Patient Instructions (Signed)
Transient Ischemic Attack °A transient ischemic attack (TIA) is a "warning stroke" that causes stroke-like symptoms. A TIA does not cause lasting damage to the brain. The symptoms of a TIA can happen fast and do not last long. It is important to know the symptoms of a TIA and what to do. This can help prevent stroke or death. °Follow these instructions at home: °· Take medicines only as told by your doctor. Make sure you understand all of the instructions. °· You may need to take aspirin or warfarin medicine. Warfarin needs to be taken exactly as told. °? Taking too much or too little warfarin is dangerous. Blood tests must be done as often as told by your doctor. A PT blood test measures how long it takes for blood to clot. Your PT is used to calculate another value called an INR. Your PT and INR help your doctor adjust your warfarin dosage. He or she will make sure you are taking the right amount. °? Food can cause problems with warfarin and affect the results of your blood tests. This is true for foods high in vitamin K. Eat the same amount of foods high in vitamin K each day. Foods high in vitamin K include spinach, kale, broccoli, cabbage, collard and turnip greens, Brussels sprouts, peas, cauliflower, seaweed, and parsley. Other foods high in vitamin K include beef and pork liver, green tea, and soybean oil. Eat the same amount of foods high in vitamin K each day. Avoid big changes in your diet. Tell your doctor before changing your diet. Talk to a food specialist (dietitian) if you have questions. °? Many medicines can cause problems with warfarin and affect your PT and INR. Tell your doctor about all medicines you take. This includes vitamins and dietary pills (supplements). Do not take or stop taking any prescribed or over-the-counter medicines unless your doctor tells you to. °? Warfarin can cause more bruising or bleeding. Hold pressure over any cuts for longer than normal. Talk to your doctor about other  side effects of warfarin. °? Avoid sports or activities that may cause injury or bleeding. °? Be careful when you shave, floss, or use sharp objects. °? Avoid or drink very little alcohol while taking warfarin. Tell your doctor if you change how much alcohol you drink. °? Tell your dentist and other doctors that you take warfarin before any procedures. °· Follow your diet program as told, if you are given one. °· Keep a healthy weight. °· Stay active. Try to get at least 30 minutes of activity on all or most days. °· Do not use any tobacco products, including cigarettes, chewing tobacco, or electronic cigarettes. If you need help quitting, ask your doctor. °· Limit alcohol intake to no more than 1 drink per day for nonpregnant women and 2 drinks per day for men. One drink equals 12 ounces of beer, 5 ounces of wine, or 1½ ounces of hard liquor. °· Do not abuse drugs. °· Keep your home safe so you do not fall. You can do this by: °? Putting grab bars in the bedroom and bathroom. °? Raising toilet seats. °? Putting a seat in the shower. °· Keep all follow-up visits as told by your doctor. This is important. °Contact a doctor if: °· Your personality changes. °· You have trouble swallowing. °· You have double vision. °· You are dizzy. °· You have a fever. °Get help right away if: °These symptoms may be an emergency. Do not wait to see if   the symptoms will go away. Get medical help right away. Call your local emergency services (911 in the [place] Do not drive yourself to the hospital. °· You have sudden weakness or lose feeling (go numb), especially on one side of the body. This can affect your: °? Face. °? Arm. °? Leg. °· You have sudden trouble walking. °· You have sudden trouble moving your arms or legs. °· You have sudden confusion. °· You have trouble talking. °· You have trouble understanding. °· You have sudden trouble seeing in one or both eyes. °· You lose your balance. °· Your movements are not smooth. °· You  have a sudden, very bad headache with no known cause. °· You have new chest pain. °· Your heartbeat is unsteady. °· You are partly or totally unaware of what is going on around you. ° °This information is not intended to replace advice given to you by your health care provider. Make sure you discuss any questions you have with your health care provider. °Document Released: 04/11/2008 Document Revised: 03/06/2016 Document Reviewed: 10/08/2013 °Elsevier Interactive Patient Education © 2018 [employer] ° °

## 2017-07-04 NOTE — Addendum Note (Signed)
Addended by: Larey Seat on: 07/04/2017 09:17 AM   Modules accepted: Orders

## 2017-07-04 NOTE — Progress Notes (Addendum)
PATIENT: [name] DOB: Jul 21, 1955  REASON FOR VISIT: follow up- stroke, restless leg syndrome, left hemiparesis HISTORY FROM: patient  HISTORY OF PRESENT ILLNESS:   I have the pleasure of seeing [name] and her husband today on 04 July 2017, the patient had been evaluated in May for a possible seizure activity versus TIA.  Her EEG returned normal.  She had no more spells since.  Patient is no longer on baclofen, stroke prevention is provided by antiplatelet therapy with aspirin, She is pain-free, she reports no headaches, dizziness or palpitations.  She is walking with a  single point cane.   Today is 11/27/2016 and I have the pleasure of seeing [name] in the presence of her husband, daughter and granddaughter. [name] suffered a possible seizure episode on 10/15/2016. [name] is a daughter described which she had witnessed a day; the patient had fallen out of bed and onto a stool that was placed next to the bed she had very limited recollection of what happened and her husband called to the emergency room services. Her daughter met her at the hospital and found her mother still very confused she had speech arrest. She tried to communicate by gesticulation. Was pantng, seemed in discomfort. VS were Ok. Patient remained restless and erratic, needing to nurses to observe her and basically help prevent injuries. Then within an hour or so the patient became suddenly fully aware of her surroundings. The patient had received nitroglycerin for chest pain, she had some aspirin after her stroke had been ruled out, nausea medication, but she was not given a seizure medicine or sedation.  The EEG during hospitalization was read by Dr. Alexis Goodell and described a drowsy rhythm with rare left temporal sharp transients and phase reversal at the third temporal lead. Photic stimulation did not show any changes, hyperventilation was deferred. The finding was suggestive of a  focal disturbance with epileptogenic potential. The patient was treated by Dr. Tressia Miners, and the discharge diagnosis on April 3 with acute encephalopathy with a new onset seizure, abnormal EEG, started on Keppra 500 mg twice a day to follow-up with neurology outpatient, no driving. She has taking it and feels slowed, thought process delay. Her family feels she is snappy and angry since starting the medication.  She takes narco for back pain. Other medications were baclofen, Lipitor, Flexeril, Lomotil, enalapril, gabapentin, magnesium oxide insulin did troponin, Protonix, nitroglycerin, Toprol. Plavix was discontinued after she had a GI bleed, colonoscopy confirmed ulceration 06-2016 . May re start after 6 month.  MM- 2017 [name] is a 61 year old female with a history of stroke with subsequent left hemiparesis. Her stroke occurred in 2000. She reports that she has continued on aspirin and Plavix. She is tolerating these medications well. Her blood pressure is slightly elevated today. Her primary care is managing her blood pressure medications. Reports that her last hemoglobin A1c was 8.4. She is currently on Lipitor for her cholesterol. She denies any additional strokelike symptoms. She states that she does have difficulty walking long distances and for that reason she will use a wheelchair. She has very limited movement of the left arm. She has found baclofen to be beneficial for spasms. She also has a history of restless leg symptoms. Her primary care tried her on Requip however she did not find it beneficial. She is currently on gabapentin not specifically for restless leg symptoms. She states that her symptoms are tolerable but does at times interfere with  her sleep. She returns today for an evaluation.  HISTORY (Dr. Edwena Felty note): [name] is a 61 year old Caucasian female patient with significant left hemiparetic residual and left homonymous hemianopsia after her stroke she suffered in 2000.  She has continued to take Plavix and aspirin and her risk factors have not changed significantly since our last visit. She has a history of his hypertension, migraine headaches, gastroesophageal reflux, diabetes, hyperlipidemia, obesity, irritable bowel syndrome, urinary incontinence, and was last admitted with a UTI to [facility] in [place]. Admission date was 12/26/2015 under the guidance of Dr. Fulton Reek. Primary doctor is Miguel Aschoff Junior discharge date was 12/28/2015. The patient was diagnosed with altered mental status due to urosepsis. She was treated with 3 antibiotics. Upon discharge the patient was told that she did not have an infection? She was diagnosed with systemic inflammatory response syndrome and encephalopathy. A CT of the head was obtained on the 11th of June, it showed no acute intracranial abnormalities, an MRI of the brain with and without contrast followed on 12/28/2015."   It appeared stable in comparison to a brain image from 2015 with chronic deep left intracranial gliosis, postoperative changes to both globes after cataract surgery, some minor nasal sinus mucosal thickening. Chronic encephalomalacia in the left MCA territory with ex vacuo enlargement of the left frontal horn of the ventricle" she had that stroke in 2000.  The patient reports that on Saturday preceding her admission to the hospital she had worked on her parents state ready to dissolve the estate. She was at least 8 hours out on a very hot and sunny day working outside not sure that she hydrated properly developing a terrific headache she cannot remember what happened that night from Saturday to Sunday and her husband reports that she was not able to sleep for a couple of nights. He accompanied her to the hospital. It is likely that she had a heat related accident. She missed a couple of medicine doses as well.    REVIEW OF SYSTEMS: Out of a complete 14 system review of  symptoms, the patient complains only of the following symptoms, and all other reviewed systems are negative  ALLERGIES: Allergies  Allergen Reactions  . Morphine And Related Anaphylaxis  . Fentanyl Other (See Comments)    Unknown reaction.  . Reglan [Metoclopramide]     Other reaction(s): Unknown Elevated BP  . Simvastatin Other (See Comments)    Muscle pain  . Betadine [Povidone Iodine] Rash  . Iodine Rash    Other reaction(s): Unknown  . Tetracyclines & Related Rash    Other reaction(s): Unknown    HOME MEDICATIONS: Outpatient Medications Prior to Visit  Medication Sig Dispense Refill  . ACCU-CHEK AVIVA PLUS test strip TEST BLOOD SUGAR THREE TIMES DAILY 300 each 3  . ACCU-CHEK SOFTCLIX LANCETS lancets CHECK BLOOD SUGAR THREE TIMES DAILY 300 each 3  . amLODipine (NORVASC) 10 MG tablet Take 1 tablet by mouth once daily 90 tablet 3  . aspirin 81 MG EC tablet Take 1 tablet (81 mg total) by mouth daily. Swallow whole. 30 tablet 12  . atorvastatin (LIPITOR) 40 MG tablet Take 0.5 tablets (20 mg total) by mouth at bedtime. 90 tablet 3  . Blood Glucose Monitoring Suppl (ACCU-CHEK AVIVA PLUS) w/Device KIT Check sugar three times daily DX E11.9-needs a meter 1 kit 0  . diphenoxylate-atropine (LOMOTIL) 2.5-0.025 MG tablet 2 tablets initially and then 1 tablet every 6 hours as needed for diarrhea 60 tablet  1  . enalapril (VASOTEC) 20 MG tablet Take 1 tablet (20 mg total) by mouth 2 (two) times daily. 180 tablet 3  . HYDROcodone-acetaminophen (NORCO) 10-325 MG tablet Take 1-2 tablets by mouth every 4 (four) hours as needed. 60 tablet 0  . HYDROcodone-homatropine (HYCODAN) 5-1.5 MG/5ML syrup 5 ml 4-6 hours as needed for cough 120 mL 0  . insulin aspart protamine- aspart (NOVOLOG MIX 70/30) (70-30) 100 UNIT/ML injection Inject 25-30 Units into the skin 2 (two) times daily.     [employer] Devices (ACCU-CHEK SOFTCLIX) lancets Check sugar three times daily DX E11.9 100 each 3  . levETIRAcetam  (KEPPRA) 500 MG tablet Take 1 tablet (500 mg total) by mouth 2 (two) times daily. 500 mg bid. 180 tablet 3  . metFORMIN (GLUCOPHAGE) 1000 MG tablet Take 1 tablet (1,000 mg total) by mouth 2 (two) times daily. 180 tablet 3  . metoprolol succinate (TOPROL-XL) 100 MG 24 hr tablet Take 1 tablet (100 mg total) by mouth daily. 90 tablet 3  . nitroGLYCERIN (NITROSTAT) 0.4 MG SL tablet Place 1 tablet (0.4 mg total) under the tongue every 5 (five) minutes as needed for chest pain. 25 tablet 0  . oxybutynin (DITROPAN) 5 MG tablet Take 1 tablet (5 mg total) by mouth 3 (three) times daily. 270 tablet 3  . pantoprazole (PROTONIX) 40 MG tablet Take 1 tablet by mouth once daily 90 tablet 3  . triamcinolone cream (KENALOG) 0.1 % Can use thin layer on affected areas 2x daily for one week at a time. Not for face. 30 g 0  . amoxicillin-clavulanate (AUGMENTIN) 875-125 MG tablet Take 1 tablet by mouth 2 (two) times daily. 20 tablet 0  . atorvastatin (LIPITOR) 20 MG tablet Take 1 tablet by mouth at bedtime 90 tablet 3  . cyclobenzaprine (FLEXERIL) 10 MG tablet 1/2-1 po qHS prn    . oxybutynin (DITROPAN) 5 MG tablet Take 1 tablet by mouth 3 times a day 270 tablet 3   No facility-administered medications prior to visit.     PAST MEDICAL HISTORY: Past Medical History:  Diagnosis Date  . Allergy   . Arthritis   . Cellulitis and abscess of face   . Depression   . Diabetes mellitus ([place])   . Edema   . GERD (gastroesophageal reflux disease)   . Hematuria   . History of echocardiogram    2008: normal, 2011: normal LVSF  . History of nuclear stress test    a. 12/2009: lexiscan - negative  . Hyperlipidemia   . Hypertension   . IBS (irritable bowel syndrome)   . Migraine   . Morbid obesity ([place])   . Nocturia   . Stroke ([place]) 2000  . TIA (transient ischemic attack) 2010  . Urgency of micturation   . Urinary frequency   . Urinary incontinence     PAST SURGICAL HISTORY: Past Surgical History:  Procedure  Laterality Date  . ABDOMINAL HYSTERECTOMY    . ABDOMINOPLASTY    . APPENDECTOMY    . BACK SURGERY     extensive spinal fusion  . carpel tunnel surgery    . CATARACT EXTRACTION     x 2  . CESAREAN SECTION    . CHOLECYSTECTOMY    . COLONOSCOPY  2010  . COLONOSCOPY WITH PROPOFOL N/A 07/11/2016   Procedure: COLONOSCOPY WITH PROPOFOL;  Surgeon: Lucilla Lame, MD;  Location: [facility] ENDOSCOPY;  Service: Endoscopy;  Laterality: N/A;  . EYE SURGERY    . HERNIA REPAIR    .  SHOULDER SURGERY    . TOE SURGERY    . UPPER GI ENDOSCOPY    . WRIST FRACTURE SURGERY Left     FAMILY HISTORY: Family History  Problem Relation Age of Onset  . Hyperlipidemia Mother   . Arrhythmia Mother        WPW  . Hypertension Mother   . Rheum arthritis Mother   . Heart disease Mother   . Heart attack Father 72  . Hyperlipidemia Father   . Hypertension Father   . Stroke Father   . Diabetes Father   . Heart disease Father   . Coronary artery disease Father   . Diabetes Sister   . Hyperlipidemia Sister   . Hypertension Sister   . Depression Sister   . Depression Sister   . Diabetes Sister   . Hypertension Sister   . Hyperlipidemia Sister   . Kidney disease Paternal Grandmother     SOCIAL HISTORY: Social History   Socioeconomic History  . Marital status: Married    Spouse name: Marcello Moores   . Number of children: 2  . Years of education: 18  . Highest education level: Not on file  Social Needs  . Financial resource strain: Not on file  . Food insecurity - worry: Not on file  . Food insecurity - inability: Not on file  . Transportation needs - medical: Not on file  . Transportation needs - non-medical: Not on file  Occupational History  . Occupation: Disabled   . Occupation: retired  Tobacco Use  . Smoking status: Never Smoker  . Smokeless tobacco: Never Used  Substance and Sexual Activity  . Alcohol use: No  . Drug use: No  . Sexual activity: No  Other Topics Concern  . Not on file  Social  History Narrative   ** Merged History Encounter **       Patient lives at home with husband Marcello Moores.    Patient has 2 children and 2 step children.    Patient has 12+ years of education.    Patient is Disabled.       PHYSICAL EXAM  Vitals:   07/04/17 0829  BP: (!) 173/78  Pulse: 70  Weight: 239 lb (108.4 kg)  Height: 5' 2"  (1.575 m)   Body mass index is 43.71 kg/m.  Generalized: Well developed, in no acute distress   Neurological examination  Mentation: Alert oriented to time, place, history taking. She speaks fluently- Cranial nerve :  No change in taste or smell, but less appetite.  Pupils were equal, status post cataract, she has diabetic retinopathy.    Extraocular movements were full, visual field were full on confrontational test. Facial sensation normal. Uvula and  tongue midline. Head turning and shoulder shrug  were intact . Right shoulder is lower.  Motor: Limited movement in the left arm particularly in the elbow and hands.  Spasticity over left extremities  Sensory: Sensory testing is intact to soft touch on all 4 extremities however slightly decreased on the left hand. Coordination: Cerebellar testing reveals good finger-nose-finger on the right, with mild dysmetria and pronator drift  difficulty on the left. Reflexes: 2/2     DIAGNOSTIC DATA (LABS, IMAGING, TESTING) - I reviewed patient records, labs, notes, testing and imaging myself where available. MRI HEAD WITHOUT CONTRAST  TECHNIQUE: Multiplanar, multiecho pulse sequences of the brain and surrounding structures were obtained without intravenous contrast.  COMPARISON:  Head CT from yesterday.  Brain MRI 07/08/2016  FINDINGS: Brain: No acute infarction, hemorrhage,  hydrocephalus, extra-axial collection or mass lesion.  Remote left basal ganglia and neighboring white matter infarct with ex vacuo dilatation of the left lateral ventricle. There has also been remote low internal capsule lacunar  infarct on the right with wallerian degeneration seen into the brainstem. Remote lacunar infarct in the right posterior frontal subcortical white matter. Small remote high posterior right frontal cortex infarct.  Chronic rounded DWI hyperintensity at the level of the posterior sella. No pituitary enlargement or underlying nodule seen on the other sequences to suggest this is clinically significant.  Mild ventriculomegaly attributed to generalized volume loss.  Vascular: Preserved flow voids  Skull and upper cervical spine: Negative  Sinuses/Orbits: Bilateral cataract resection.  Otherwise negative.  IMPRESSION: 1. No acute finding or change since 2017. 2. Remote small/medium vessel infarcts as described.   Electronically Signed   By: Monte Fantasia M.D.   On: 10/16/2016 09:48   Orders Requiring a Screening Form   Procedure Order Status Form Status  MR BRAIN WO CONTRAST Completed Completed  Vitals   Height Weight BMI (Calculated)  5' 1"  (1.549 m) 257 lb 3.2 oz (116.7 kg) 48.7     Lab Results  Component Value Date   WBC 8.6 11/22/2016   HGB 12.7 11/22/2016   HCT 37.7 11/22/2016   MCV 84.4 11/22/2016   PLT 221 11/22/2016      Component Value Date/Time   NA 137 11/22/2016 1209   NA 137 07/25/2016 1429   NA 140 06/17/2014 0744   K 4.2 11/22/2016 1209   K 4.1 06/17/2014 0744   CL 103 11/22/2016 1209   CL 105 06/17/2014 0744   CO2 24 11/22/2016 1209   CO2 27 06/17/2014 0744   GLUCOSE 158 (H) 11/22/2016 1209   GLUCOSE 116 (H) 06/17/2014 0744   BUN 24 (H) 11/22/2016 1209   BUN 22 07/25/2016 1429   BUN 35 (H) 06/17/2014 0744   CREATININE 1.25 (H) 11/22/2016 1209   CREATININE 1.29 06/17/2014 0744   CALCIUM 9.0 11/22/2016 1209   CALCIUM 8.6 06/17/2014 0744   PROT 6.7 07/25/2016 1429   PROT 6.5 01/18/2014 0059   ALBUMIN 4.1 07/25/2016 1429   ALBUMIN 3.0 (L) 01/18/2014 0059   AST 23 07/25/2016 1429   AST 21 01/18/2014 0059   ALT 16 07/25/2016 1429    ALT 18 01/18/2014 0059   ALKPHOS 79 07/25/2016 1429   ALKPHOS 97 01/18/2014 0059   BILITOT 0.4 07/25/2016 1429   BILITOT 0.3 01/18/2014 0059   GFRNONAA 46 (L) 11/22/2016 1209   GFRNONAA 45 (L) 06/17/2014 0744   GFRNONAA 47 (L) 01/18/2014 0059   GFRAA 53 (L) 11/22/2016 1209   GFRAA 55 (L) 06/17/2014 0744   GFRAA 55 (L) 01/18/2014 0059   Lab Results  Component Value Date   CHOL 144 10/15/2016   HDL 51 10/15/2016   LDLCALC 69 10/15/2016   TRIG 122 10/15/2016   CHOLHDL 2.8 10/15/2016   Lab Results  Component Value Date   HGBA1C 7.3 05/07/2017   Lab Results  Component Value Date   VITAMINB12 551 01/15/2009   Lab Results  Component Value Date   TSH 3.370 07/18/2016      ASSESSMENT AND PLAN 61 y.o. year old female  has a past medical history of Allergy, Arthritis, Cellulitis and abscess of face, Depression, Diabetes mellitus ([place]), Edema, GERD (gastroesophageal reflux disease), Hematuria, History of echocardiogram, History of nuclear stress test, Hyperlipidemia, Hypertension, IBS (irritable bowel syndrome), Migraine, Morbid obesity ([place]), Nocturia, Stroke ([place]) (2000), TIA (  transient ischemic attack) (2010), Urgency of micturation, Urinary frequency, and Urinary incontinence. here with:  Spells. - TIA versus stroke, history of left  hemispheric stroke in 2000, hemiplegia.    1. History of stroke with enkephalomalacia. New MRI confirmed no new stroke. Not likely that event was TIA, but she remains on ASA 81 mg.   2. Left hemiparesis post CVA , gait disorder, slowed and haltering speech.  Stroke from 2000.  3. Possible single seizure ( ? ) with postiktal confusion. Has a higher risk due to old CVA- large. Had multiple spells before.  Stays on Keppra 500 mg bid for control- has not had another spell. EEG normal in office, abnormal in hospital.   She stays on Keppra as she is allowed to drive now and doesn't want to lose that previlege for 6 month after d/c seizure medications.  She  has no more RLS ! . Cut out caffeine.  Has high BP- needs to talk to Dr Rosanna Randy about CKD stage 2 with HTN and DM. Given that she has PAD- may have renal artery stenosis , too. Fever blisters around the lips, mouth- lasting 14 days, burning. Topical  acyclovir- not systemic due to renal insufficieny      Rv in 12 month.  Larey Seat, MD   07/04/2017, 8:45 AM [facility] [address], [place], [place] [address] [phone]

## 2017-07-05 ENCOUNTER — Ambulatory Visit: Payer: Self-pay | Admitting: Neurology

## 2017-07-19 ENCOUNTER — Telehealth: Payer: Self-pay | Admitting: Family Medicine

## 2017-07-19 NOTE — Telephone Encounter (Signed)
Please advise-Anastasiya V Hopkins, RMA  

## 2017-07-19 NOTE — Telephone Encounter (Signed)
Pt having Abd pain, very nauseated, diarrhea since last night.    Does she need an appt or do you want to call her in [place]?

## 2017-07-22 NOTE — Telephone Encounter (Signed)
Appt if not improving

## 2017-07-23 NOTE — Telephone Encounter (Signed)
Pt advised, pt is improving-Carmen Vallecillo V Junior Kenedy, RMA

## 2017-07-24 ENCOUNTER — Other Ambulatory Visit: Payer: Self-pay

## 2017-07-24 ENCOUNTER — Emergency Department
Admission: EM | Admit: 2017-07-24 | Discharge: 2017-07-24 | Disposition: A | Discharge status: Home / Self Care | Payer: PPO | Attending: Emergency Medicine | Admitting: Emergency Medicine

## 2017-07-24 ENCOUNTER — Encounter: Payer: Self-pay | Admitting: Emergency Medicine

## 2017-07-24 DIAGNOSIS — E1122 Type 2 diabetes mellitus with diabetic chronic kidney disease: Secondary | ICD-10-CM | POA: Insufficient documentation

## 2017-07-24 DIAGNOSIS — Z79899 Other long term (current) drug therapy: Secondary | ICD-10-CM | POA: Diagnosis not present

## 2017-07-24 DIAGNOSIS — E039 Hypothyroidism, unspecified: Secondary | ICD-10-CM | POA: Diagnosis not present

## 2017-07-24 DIAGNOSIS — I129 Hypertensive chronic kidney disease with stage 1 through stage 4 chronic kidney disease, or unspecified chronic kidney disease: Secondary | ICD-10-CM | POA: Insufficient documentation

## 2017-07-24 DIAGNOSIS — N183 Chronic kidney disease, stage 3 (moderate): Secondary | ICD-10-CM | POA: Insufficient documentation

## 2017-07-24 DIAGNOSIS — Z794 Long term (current) use of insulin: Secondary | ICD-10-CM | POA: Diagnosis not present

## 2017-07-24 DIAGNOSIS — E1165 Type 2 diabetes mellitus with hyperglycemia: Secondary | ICD-10-CM | POA: Insufficient documentation

## 2017-07-24 DIAGNOSIS — E162 Hypoglycemia, unspecified: Secondary | ICD-10-CM | POA: Diagnosis not present

## 2017-07-24 DIAGNOSIS — E11649 Type 2 diabetes mellitus with hypoglycemia without coma: Secondary | ICD-10-CM | POA: Diagnosis not present

## 2017-07-24 DIAGNOSIS — N3090 Cystitis, unspecified without hematuria: Secondary | ICD-10-CM | POA: Insufficient documentation

## 2017-07-24 DIAGNOSIS — Z7982 Long term (current) use of aspirin: Secondary | ICD-10-CM | POA: Insufficient documentation

## 2017-07-24 DIAGNOSIS — I69359 Hemiplegia and hemiparesis following cerebral infarction affecting unspecified side: Secondary | ICD-10-CM | POA: Diagnosis not present

## 2017-07-24 LAB — LIPASE, BLOOD: Lipase: 37 U/L (ref 11–51)

## 2017-07-24 LAB — URINALYSIS, COMPLETE (UACMP) WITH MICROSCOPIC
BILIRUBIN URINE: NEGATIVE
Glucose, UA: NEGATIVE mg/dL
HGB URINE DIPSTICK: NEGATIVE
Ketones, ur: NEGATIVE mg/dL
Nitrite: NEGATIVE
PROTEIN: 100 mg/dL — AB
SPECIFIC GRAVITY, URINE: 1.02 (ref 1.005–1.030)
pH: 5 (ref 5.0–8.0)

## 2017-07-24 LAB — CBC WITH DIFFERENTIAL/PLATELET
Basophils Absolute: 0 10*3/uL (ref 0–0.1)
Basophils Relative: 0 %
Eosinophils Absolute: 0 10*3/uL (ref 0–0.7)
Eosinophils Relative: 1 %
HEMATOCRIT: 39.5 % (ref 35.0–47.0)
HEMOGLOBIN: 13.1 g/dL (ref 12.0–16.0)
LYMPHS ABS: 0.5 10*3/uL — AB (ref 1.0–3.6)
LYMPHS PCT: 6 %
MCH: 28.9 pg (ref 26.0–34.0)
MCHC: 33.3 g/dL (ref 32.0–36.0)
MCV: 86.6 fL (ref 80.0–100.0)
Monocytes Absolute: 0.5 10*3/uL (ref 0.2–0.9)
Monocytes Relative: 6 %
NEUTROS PCT: 87 %
Neutro Abs: 7.9 10*3/uL — ABNORMAL HIGH (ref 1.4–6.5)
Platelets: 251 10*3/uL (ref 150–440)
RBC: 4.56 MIL/uL (ref 3.80–5.20)
RDW: 14.5 % (ref 11.5–14.5)
WBC: 9 10*3/uL (ref 3.6–11.0)

## 2017-07-24 LAB — COMPREHENSIVE METABOLIC PANEL
ALBUMIN: 3.9 g/dL (ref 3.5–5.0)
ALT: 14 U/L (ref 14–54)
ANION GAP: 13 (ref 5–15)
AST: 33 U/L (ref 15–41)
Alkaline Phosphatase: 74 U/L (ref 38–126)
BUN: 25 mg/dL — ABNORMAL HIGH (ref 6–20)
CHLORIDE: 104 mmol/L (ref 101–111)
CO2: 23 mmol/L (ref 22–32)
Calcium: 9 mg/dL (ref 8.9–10.3)
Creatinine, Ser: 1.1 mg/dL — ABNORMAL HIGH (ref 0.44–1.00)
GFR calc non Af Amer: 53 mL/min — ABNORMAL LOW (ref >60)
Glucose, Bld: 104 mg/dL — ABNORMAL HIGH (ref 65–99)
Potassium: 4.2 mmol/L (ref 3.5–5.1)
SODIUM: 140 mmol/L (ref 135–145)
Total Bilirubin: 0.3 mg/dL (ref 0.3–1.2)
Total Protein: 7.3 g/dL (ref 6.5–8.1)

## 2017-07-24 LAB — GLUCOSE, CAPILLARY
GLUCOSE-CAPILLARY: 75 mg/dL (ref 65–99)
Glucose-Capillary: 126 mg/dL — ABNORMAL HIGH (ref 65–99)

## 2017-07-24 MED ORDER — CEFPODOXIME PROXETIL 200 MG PO TABS
200.0000 mg | ORAL_TABLET | Freq: Once | ORAL | Status: AC
  Administered 2017-07-24: 200 mg via ORAL
  Filled 2017-07-24: qty 1

## 2017-07-24 MED ORDER — CEFPODOXIME PROXETIL 100 MG PO TABS: 200.0000 mg | ORAL_TABLET | Freq: Two times a day (BID) | ORAL | 0 refills | Status: DC

## 2017-07-24 MED ORDER — SODIUM CHLORIDE 0.9 % IV BOLUS (SEPSIS)
1000.0000 mL | Freq: Once | INTRAVENOUS | Status: AC
  Administered 2017-07-24: 1000 mL via INTRAVENOUS

## 2017-07-24 NOTE — ED Notes (Signed)
Patient assisted to ambulate to [facility]. Patient reports weakness but able to take several steps without assistance. Left-sided weakness noted from previous CVA.

## 2017-07-24 NOTE — ED Triage Notes (Addendum)
Patient from home via [facility]. Reports she hasn't eaten since last night around 6pm. Patient reports weakness this morning and called EMS because she couldn't get out of bed. EMS reports CBG upon their arrival 56. Patient given 2 cups of OJ with added sugar by husband, after which CBG rose to 85. EMS reports patient was shaky with trouble concentrating upon their arrival. Upon arrival to ED patient alert and oriented x4 and states she "feels better".
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
## 2017-07-24 NOTE — ED Notes (Signed)
Patient to lobby in wheelchair with husband. NAD noted. Verbalized understanding of discharge instructions and followup care.

## 2017-07-25 LAB — URINE CULTURE

## 2017-07-26 DIAGNOSIS — B351 Tinea unguium: Secondary | ICD-10-CM | POA: Diagnosis not present

## 2017-07-26 DIAGNOSIS — L851 Acquired keratosis [keratoderma] palmaris et plantaris: Secondary | ICD-10-CM | POA: Diagnosis not present

## 2017-07-26 DIAGNOSIS — E119 Type 2 diabetes mellitus without complications: Secondary | ICD-10-CM | POA: Diagnosis not present

## 2017-07-30 ENCOUNTER — Ambulatory Visit: Payer: PPO | Admitting: Family Medicine

## 2017-07-30 DIAGNOSIS — H10503 Unspecified blepharoconjunctivitis, bilateral: Secondary | ICD-10-CM | POA: Diagnosis not present

## 2017-07-31 ENCOUNTER — Ambulatory Visit (INDEPENDENT_AMBULATORY_CARE_PROVIDER_SITE_OTHER): Payer: PPO | Admitting: Family Medicine

## 2017-07-31 ENCOUNTER — Other Ambulatory Visit: Payer: Self-pay

## 2017-07-31 VITALS — BP 160/68 | HR 82 | Temp 98.7°F | Resp 16

## 2017-07-31 DIAGNOSIS — I10 Essential (primary) hypertension: Secondary | ICD-10-CM

## 2017-07-31 DIAGNOSIS — G40909 Epilepsy, unspecified, not intractable, without status epilepticus: Secondary | ICD-10-CM

## 2017-07-31 DIAGNOSIS — N183 Chronic kidney disease, stage 3 unspecified: Secondary | ICD-10-CM

## 2017-07-31 DIAGNOSIS — K219 Gastro-esophageal reflux disease without esophagitis: Secondary | ICD-10-CM | POA: Diagnosis not present

## 2017-07-31 DIAGNOSIS — N39498 Other specified urinary incontinence: Secondary | ICD-10-CM

## 2017-07-31 DIAGNOSIS — E785 Hyperlipidemia, unspecified: Secondary | ICD-10-CM | POA: Diagnosis not present

## 2017-07-31 DIAGNOSIS — E0822 Diabetes mellitus due to underlying condition with diabetic chronic kidney disease: Secondary | ICD-10-CM | POA: Diagnosis not present

## 2017-07-31 MED ORDER — METFORMIN HCL 1000 MG PO TABS: 1000.0000 mg | ORAL_TABLET | Freq: Two times a day (BID) | ORAL | 3 refills | Status: DC

## 2017-07-31 MED ORDER — LEVETIRACETAM 500 MG PO TABS: 500.0000 mg | ORAL_TABLET | Freq: Two times a day (BID) | ORAL | 3 refills | Status: DC

## 2017-07-31 MED ORDER — PANTOPRAZOLE SODIUM 40 MG PO TBEC: 40.0000 mg | DELAYED_RELEASE_TABLET | Freq: Every day | ORAL | 3 refills | Status: DC

## 2017-07-31 MED ORDER — AMLODIPINE BESYLATE 10 MG PO TABS: 10.0000 mg | ORAL_TABLET | Freq: Every day | ORAL | 3 refills | Status: DC

## 2017-07-31 MED ORDER — OXYBUTYNIN CHLORIDE 5 MG PO TABS: 5.0000 mg | ORAL_TABLET | Freq: Two times a day (BID) | ORAL | 3 refills | Status: DC

## 2017-07-31 MED ORDER — ATORVASTATIN CALCIUM 40 MG PO TABS: 20.0000 mg | ORAL_TABLET | Freq: Every day | ORAL | 3 refills | Status: DC

## 2017-07-31 MED ORDER — METOPROLOL SUCCINATE ER 100 MG PO TB24: 100.0000 mg | ORAL_TABLET | Freq: Every day | ORAL | 3 refills | Status: DC

## 2017-07-31 MED ORDER — ENALAPRIL MALEATE 20 MG PO TABS: 20.0000 mg | ORAL_TABLET | Freq: Two times a day (BID) | ORAL | 3 refills | Status: DC
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
## 2017-08-09 ENCOUNTER — Other Ambulatory Visit: Payer: Self-pay | Admitting: Family Medicine

## 2017-08-09 MED ORDER — HYDROCODONE-ACETAMINOPHEN 10-325 MG PO TABS: 1.0000 | ORAL_TABLET | Freq: Four times a day (QID) | ORAL | 0 refills | Status: DC | PRN

## 2017-08-09 NOTE — Telephone Encounter (Signed)
Pt contacted office for refill request on the following medications:  HYDROcodone-acetaminophen ([place]) 10-325 MG tablet   Last Rx: 06/23/17 LOV: 07/31/17  Please advise. Thanks TNP

## 2017-08-15 ENCOUNTER — Ambulatory Visit: Payer: PPO | Admitting: Family Medicine

## 2017-08-16 ENCOUNTER — Ambulatory Visit (INDEPENDENT_AMBULATORY_CARE_PROVIDER_SITE_OTHER): Payer: PPO | Admitting: Family Medicine

## 2017-08-16 VITALS — BP 158/62 | HR 65 | Temp 98.2°F | Resp 16 | Wt 233.8 lb

## 2017-08-16 DIAGNOSIS — N183 Chronic kidney disease, stage 3 unspecified: Secondary | ICD-10-CM

## 2017-08-16 DIAGNOSIS — M545 Low back pain, unspecified: Secondary | ICD-10-CM

## 2017-08-16 DIAGNOSIS — I69359 Hemiplegia and hemiparesis following cerebral infarction affecting unspecified side: Secondary | ICD-10-CM | POA: Diagnosis not present

## 2017-08-16 DIAGNOSIS — I10 Essential (primary) hypertension: Secondary | ICD-10-CM

## 2017-08-16 DIAGNOSIS — E0822 Diabetes mellitus due to underlying condition with diabetic chronic kidney disease: Secondary | ICD-10-CM

## 2017-08-16 DIAGNOSIS — G8929 Other chronic pain: Secondary | ICD-10-CM | POA: Diagnosis not present

## 2017-08-16 DIAGNOSIS — I6389 Other cerebral infarction: Secondary | ICD-10-CM | POA: Diagnosis not present

## 2017-08-16 MED ORDER — HYDRALAZINE HCL 25 MG PO TABS: 25.0000 mg | ORAL_TABLET | Freq: Two times a day (BID) | ORAL | 12 refills | Status: DC
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
## 2017-08-22 DIAGNOSIS — M48062 Spinal stenosis, lumbar region with neurogenic claudication: Secondary | ICD-10-CM | POA: Diagnosis not present

## 2017-08-22 DIAGNOSIS — M5136 Other intervertebral disc degeneration, lumbar region: Secondary | ICD-10-CM | POA: Diagnosis not present

## 2017-08-22 DIAGNOSIS — M5416 Radiculopathy, lumbar region: Secondary | ICD-10-CM | POA: Diagnosis not present

## 2017-08-23 ENCOUNTER — Telehealth: Payer: Self-pay | Admitting: Family Medicine

## 2017-08-23 NOTE — Telephone Encounter (Signed)
Pt calling to update Dr. Rosanna Randy on her BP. Pt stated that her BP today is 190/70 and she is having off and on headaches like she does when her BP starts to elevate. Please advise. Thanks TNP

## 2017-08-27 NOTE — Telephone Encounter (Signed)
Patient advised as below. Patient verbalizes understanding and is in agreement with treatment plan.  

## 2017-08-27 NOTE — Telephone Encounter (Signed)
Double hydralazine to 50mg  BID.

## 2017-08-31 ENCOUNTER — Telehealth: Payer: Self-pay | Admitting: Family Medicine

## 2017-08-31 NOTE — Telephone Encounter (Signed)
Susan House with Susan House stated that pt's husband came into the pharmacy to pick up the new hydrALAZINE (APRESOLINE) since Dr. Rosanna Randy changed it from 25 mg to 50 mg. In the Telephone Encounter opened on 08/23/17 Dr. Rosanna Randy noted in that encounter to double Hydralazine to 50 mg BID on 08/27/16. Susan House is requesting a call back for the verbal change or a new Rx if Dr. Rosanna Randy is wanting to make this a long term change to the dosage of the medication. Please advise. Thanks TNP

## 2017-08-31 NOTE — Telephone Encounter (Signed)
Please review. Thanks!  

## 2017-09-06 ENCOUNTER — Encounter: Payer: Self-pay | Admitting: Family Medicine

## 2017-09-06 ENCOUNTER — Ambulatory Visit (INDEPENDENT_AMBULATORY_CARE_PROVIDER_SITE_OTHER): Payer: PPO | Admitting: Family Medicine

## 2017-09-06 VITALS — BP 164/78 | HR 74 | Temp 98.0°F | Resp 18 | Wt 235.0 lb

## 2017-09-06 DIAGNOSIS — I10 Essential (primary) hypertension: Secondary | ICD-10-CM | POA: Diagnosis not present

## 2017-09-06 DIAGNOSIS — E118 Type 2 diabetes mellitus with unspecified complications: Secondary | ICD-10-CM

## 2017-09-06 DIAGNOSIS — Z794 Long term (current) use of insulin: Secondary | ICD-10-CM

## 2017-09-06 LAB — POCT GLYCOSYLATED HEMOGLOBIN (HGB A1C): Hemoglobin A1C: 8.4

## 2017-09-06 MED ORDER — HYDRALAZINE HCL 100 MG PO TABS: 100.0000 mg | ORAL_TABLET | Freq: Two times a day (BID) | ORAL | 3 refills | Status: DC

## 2017-09-06 NOTE — Patient Instructions (Addendum)
Blood pressure still elevated. Will increase Hydralazine to 100mg  twice a day and follow up in 1 month.

## 2017-09-06 NOTE — Progress Notes (Signed)
Patient: Susan House Female    DOB: September 21, 1955   62 y.o.   MRN: [id number] Visit Date: 09/06/2017  Today's Provider: Wilhemena Durie, MD   Chief Complaint  Patient presents with  . Hypertension  . Diabetes   Subjective:    HPI  Hypertension, follow-up:  BP Readings from Last 3 Encounters:  08/16/17 (!) 158/62  07/31/17 (!) 160/68  07/24/17 (!) 152/78    She was last seen for hypertension 3 months ago.  BP at that visit was 158/62. Management since that visit includes increasing Hydralazine to 32m twice daily. She reports good compliance with treatment. Patient report that her appetite has decreased. She is not having side effects.  She is not exercising. She is adherent to low salt diet.   Outside blood pressures are not being checked. She is experiencing none.  Patient denies chest pain, chest pressure/discomfort, claudication, dyspnea, exertional chest pressure/discomfort, fatigue, irregular heart beat, lower extremity edema, near-syncope, orthopnea, palpitations, paroxysmal nocturnal dyspnea, syncope and tachypnea.   Cardiovascular risk factors include hypertension, obesity (BMI >= 30 kg/m2) and sedentary lifestyle.  Use of agents associated with hypertension: NSAIDS.     Weight trend: stable Wt Readings from Last 3 Encounters:  08/16/17 233 lb 12.8 oz (106.1 kg)  07/24/17 239 lb (108.4 kg)  07/04/17 239 lb (108.4 kg)    Current diet: well balanced  ------------------------------------------------------------------------  Diabetes Mellitus Type II, Follow-up:   Lab Results  Component Value Date   HGBA1C 7.3 05/07/2017   HGBA1C 7.3 01/31/2017   HGBA1C 7.8 (H) 10/15/2016    Last seen for diabetes 1 months ago.  Management since then includes no changes. She reports good compliance with treatment. She is not having side effects.  Current symptoms include none and have been stable. Home blood sugar records: fasting range:  25-321  Episodes of hypoglycemia? yes - occurs frequently   Current Insulin Regimen:NOVOLOG MIX 70/30)  20-30 units Twice a day Most Recent Eye Exam: < 1 year ago Weight trend: stable Prior visit with dietician: no Current diet: well balanced Current exercise: none  Pertinent Labs:    Component Value Date/Time   CHOL 144 10/15/2016 1552   CHOL 110 01/18/2014 0059   TRIG 122 10/15/2016 1552   TRIG 122 01/18/2014 0059   HDL 51 10/15/2016 1552   HDL 35 (L) 01/18/2014 0059   LDLCALC 69 10/15/2016 1552   LDLCALC 51 01/18/2014 0059   CREATININE 1.10 (H) 07/24/2017 0942   CREATININE 1.29 06/17/2014 0744    Wt Readings from Last 3 Encounters:  09/06/17 235 lb (106.6 kg)  08/16/17 233 lb 12.8 oz (106.1 kg)  07/24/17 239 lb (108.4 kg)    ------------------------------------------------------------------------     Allergies  Allergen Reactions  . Morphine And Related Anaphylaxis  . Fentanyl Other (See Comments)    Unknown reaction.  . Reglan [Metoclopramide]     Other reaction(s): Unknown Elevated BP  . Simvastatin Other (See Comments)    Muscle pain  . Betadine [Povidone Iodine] Rash  . Iodine Rash    Other reaction(s): Unknown  . Tetracyclines & Related Rash    Other reaction(s): Unknown     Current Outpatient Medications:  .  ACCU-CHEK AVIVA PLUS test strip, TEST BLOOD SUGAR THREE TIMES DAILY, Disp: 300 each, Rfl: 3 .  ACCU-CHEK SOFTCLIX LANCETS lancets, CHECK BLOOD SUGAR THREE TIMES DAILY, Disp: 300 each, Rfl: 3 .  acyclovir ointment (ZOVIRAX) 5 %, Apply 1 application topically every  3 (three) hours., Disp: 5 g, Rfl: 1 .  amLODipine (NORVASC) 10 MG tablet, Take 1 tablet (10 mg total) by mouth daily., Disp: 90 tablet, Rfl: 3 .  aspirin 81 MG EC tablet, Take 1 tablet (81 mg total) by mouth daily. Swallow whole., Disp: 30 tablet, Rfl: 12 .  atorvastatin (LIPITOR) 40 MG tablet, Take 0.5 tablets (20 mg total) by mouth at bedtime., Disp: 90 tablet, Rfl: 3 .  Blood  Glucose Monitoring Suppl (ACCU-CHEK AVIVA PLUS) w/Device KIT, Check sugar three times daily DX E11.9-needs a meter, Disp: 1 kit, Rfl: 0 .  diphenoxylate-atropine (LOMOTIL) 2.5-0.025 MG tablet, 2 tablets initially and then 1 tablet every 6 hours as needed for diarrhea, Disp: 60 tablet, Rfl: 1 .  enalapril (VASOTEC) 20 MG tablet, Take 1 tablet (20 mg total) by mouth 2 (two) times daily., Disp: 180 tablet, Rfl: 3 .  hydrALAZINE (APRESOLINE) 25 MG tablet, Take 1 tablet (25 mg total) by mouth 2 (two) times daily. (Patient taking differently: Take 50 mg by mouth 2 (two) times daily. ), Disp: 60 tablet, Rfl: 12 .  HYDROcodone-acetaminophen (NORCO) 10-325 MG tablet, Take 1-2 tablets by mouth every 6 (six) hours as needed., Disp: 60 tablet, Rfl: 0 .  insulin aspart protamine- aspart (NOVOLOG MIX 70/30) (70-30) 100 UNIT/ML injection, Inject 25-30 Units into the skin 2 (two) times daily. , Disp: , Rfl:  .  Lancet Devices (ACCU-CHEK SOFTCLIX) lancets, Check sugar three times daily DX E11.9, Disp: 100 each, Rfl: 3 .  levETIRAcetam (KEPPRA) 500 MG tablet, Take 1 tablet (500 mg total) by mouth 2 (two) times daily. 500 mg bid., Disp: 180 tablet, Rfl: 3 .  metFORMIN (GLUCOPHAGE) 1000 MG tablet, Take 1 tablet (1,000 mg total) by mouth 2 (two) times daily., Disp: 180 tablet, Rfl: 3 .  metoprolol succinate (TOPROL-XL) 100 MG 24 hr tablet, Take 1 tablet (100 mg total) by mouth daily., Disp: 90 tablet, Rfl: 3 .  nitroGLYCERIN (NITROSTAT) 0.4 MG SL tablet, Place 1 tablet (0.4 mg total) under the tongue every 5 (five) minutes as needed for chest pain., Disp: 25 tablet, Rfl: 0 .  oxybutynin (DITROPAN) 5 MG tablet, Take 1 tablet (5 mg total) by mouth 2 (two) times daily., Disp: 180 tablet, Rfl: 3 .  pantoprazole (PROTONIX) 40 MG tablet, Take 1 tablet (40 mg total) by mouth daily., Disp: 90 tablet, Rfl: 3 .  triamcinolone cream (KENALOG) 0.1 %, Can use thin layer on affected areas 2x daily for one week at a time. Not for face.,  Disp: 30 g, Rfl: 0  Review of Systems  Constitutional: Positive for appetite change. Negative for chills, fatigue and fever.  HENT: Negative.   Eyes: Negative.   Respiratory: Negative for chest tightness and shortness of breath.   Cardiovascular: Negative for chest pain and palpitations.  Gastrointestinal: Negative for abdominal pain, nausea and vomiting.  Endocrine: Negative.   Musculoskeletal: Positive for back pain.  Allergic/Immunologic: Negative.   Neurological: Negative for dizziness and weakness.  Psychiatric/Behavioral: Negative.     Social History   Tobacco Use  . Smoking status: Never Smoker  . Smokeless tobacco: Never Used  Substance Use Topics  . Alcohol use: No   Objective:   BP (!) 164/78 (BP Location: Right Arm, Patient Position: Sitting, Cuff Size: Large)   Pulse 74   Temp 98 F (36.7 C) (Oral)   Resp 18   Wt 235 lb (106.6 kg)   SpO2 100% Comment: room air  BMI 42.98 kg/m  There  were no vitals filed for this visit.   Physical Exam  Constitutional: She is oriented to person, place, and time. She appears well-developed and well-nourished.  HENT:  Head: Normocephalic and atraumatic.  Eyes: Conjunctivae are normal.  Neck: No thyromegaly present.  Cardiovascular: Normal rate, regular rhythm and normal heart sounds.  Pulmonary/Chest: Effort normal and breath sounds normal.  Abdominal: Soft.  Neurological: She is alert and oriented to person, place, and time.  Chronic left side weakness.        Assessment & Plan:     1. Type 2 diabetes mellitus with complication, with long-term current use of insulin (HCC)  - POCT HgB A1C--8.4 today.  2. Essential hypertension Increase hydralazine to 163m BID. - hydrALAZINE (APRESOLINE) 100 MG tablet; Take 1 tablet (100 mg total) by mouth 2 (two) times daily.  Dispense: 60 tablet; Refill: 3 3.Obesity      RWilhemena Durie MD  [facility]

## 2017-09-10 IMAGING — CR DG ABDOMEN 1V
1 series · 1 of 1 positions shown · non-contrast
Comparison: None.

CLINICAL DATA: Nephrolithiasis.

EXAM:
ABDOMEN - 1 VIEW

[dg abd 1 view]
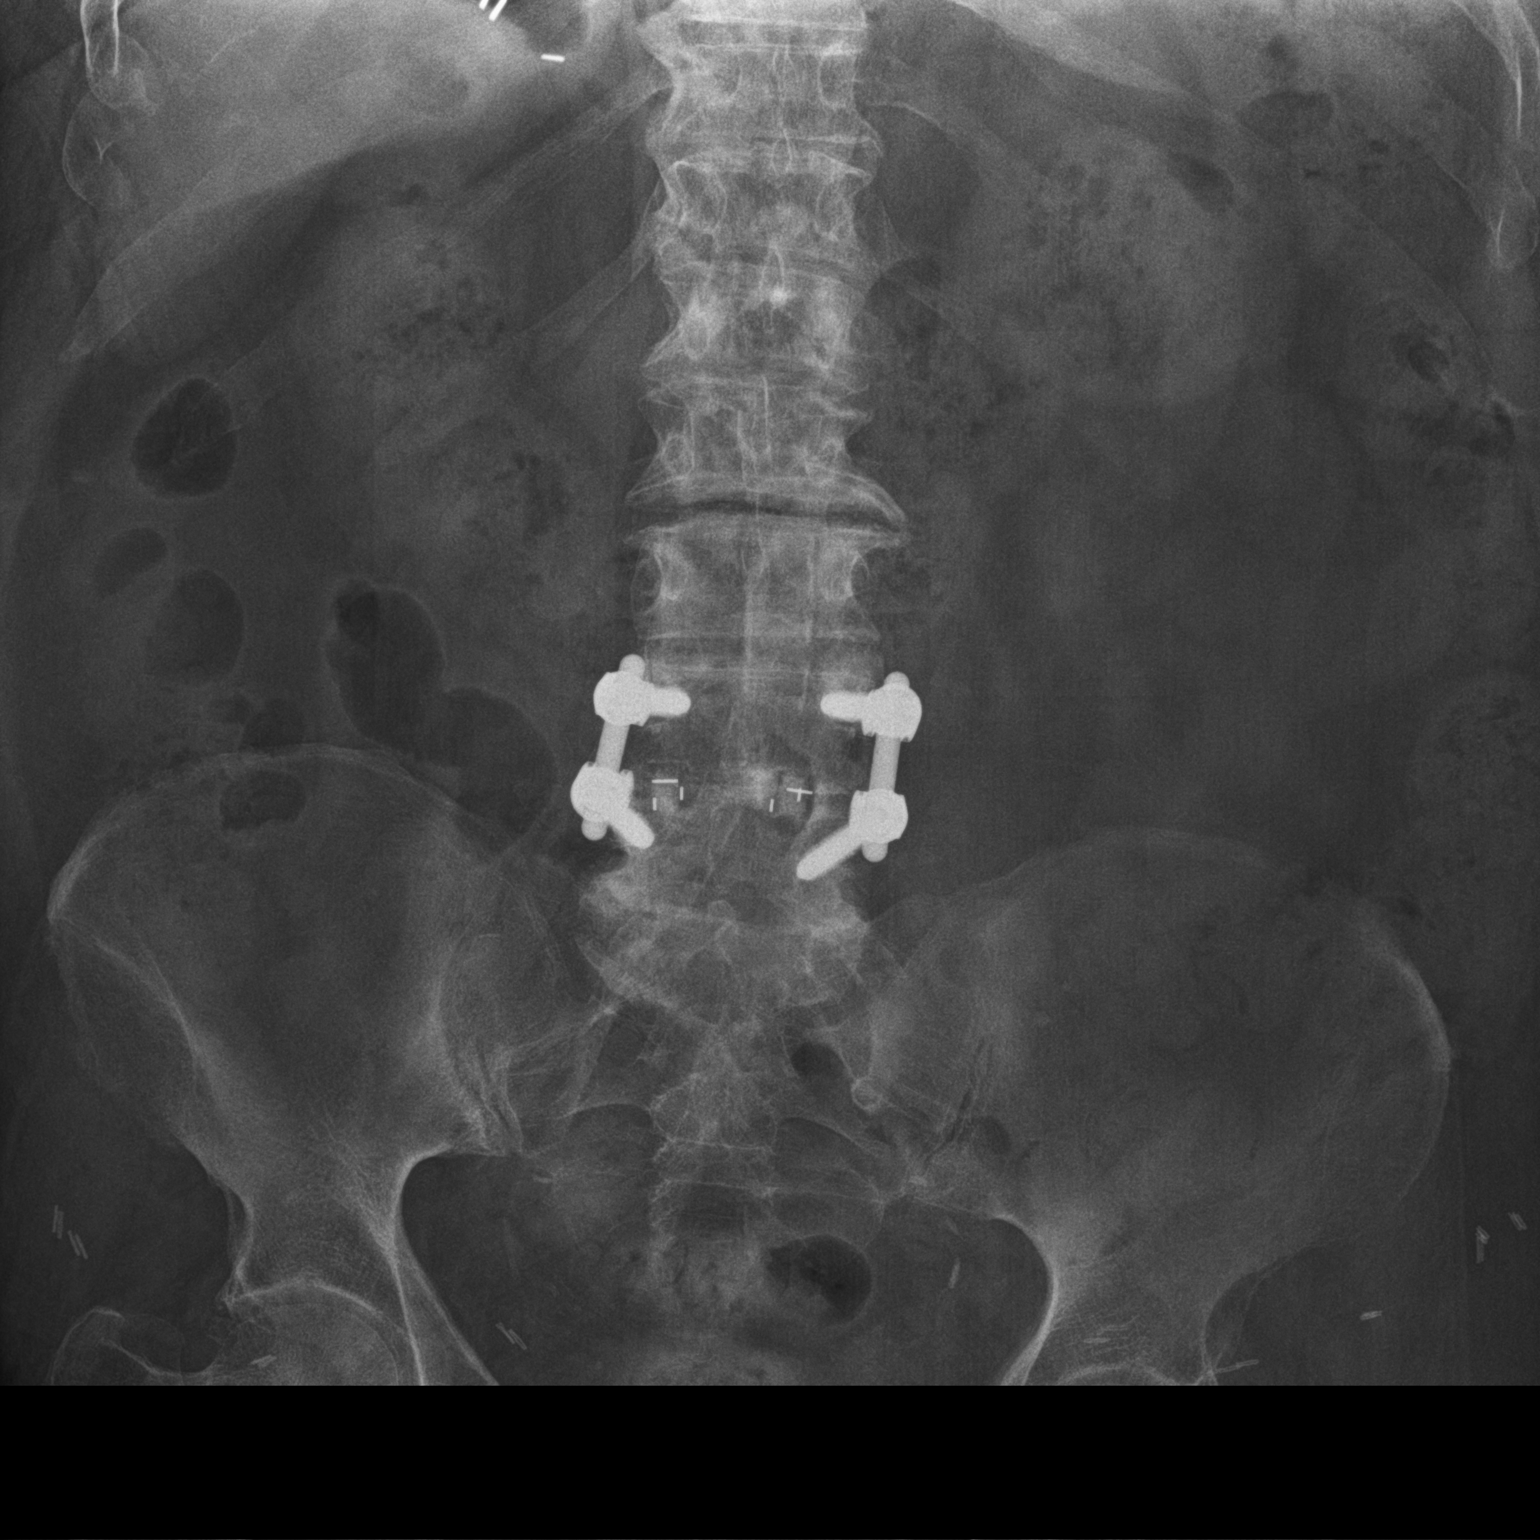

[1 of 1 positions shown; findings below may reference images not displayed]

FINDINGS: The bowel gas pattern is normal. Status post surgical posterior
fusion of lower lumbar spine. Surgical clips are noted in the
pelvis. No definite renal calculi are noted. Phleboliths are noted
in the pelvis.
IMPRESSION: No definite evidence of renal calculi. No evidence of bowel
obstruction or ileus.

## 2017-09-19 DIAGNOSIS — M48062 Spinal stenosis, lumbar region with neurogenic claudication: Secondary | ICD-10-CM | POA: Diagnosis not present

## 2017-09-19 DIAGNOSIS — M5416 Radiculopathy, lumbar region: Secondary | ICD-10-CM | POA: Diagnosis not present

## 2017-09-19 DIAGNOSIS — M5126 Other intervertebral disc displacement, lumbar region: Secondary | ICD-10-CM | POA: Diagnosis not present

## 2017-09-20 ENCOUNTER — Other Ambulatory Visit: Payer: Self-pay | Admitting: Family Medicine

## 2017-09-20 ENCOUNTER — Ambulatory Visit (INDEPENDENT_AMBULATORY_CARE_PROVIDER_SITE_OTHER): Payer: PPO | Admitting: Family Medicine

## 2017-09-20 ENCOUNTER — Other Ambulatory Visit: Payer: Self-pay

## 2017-09-20 VITALS — BP 160/82 | HR 94 | Temp 98.1°F | Resp 16

## 2017-09-20 DIAGNOSIS — G40909 Epilepsy, unspecified, not intractable, without status epilepticus: Secondary | ICD-10-CM

## 2017-09-20 DIAGNOSIS — I209 Angina pectoris, unspecified: Secondary | ICD-10-CM | POA: Diagnosis not present

## 2017-09-20 DIAGNOSIS — I63512 Cerebral infarction due to unspecified occlusion or stenosis of left middle cerebral artery: Secondary | ICD-10-CM | POA: Diagnosis not present

## 2017-09-20 DIAGNOSIS — K219 Gastro-esophageal reflux disease without esophagitis: Secondary | ICD-10-CM

## 2017-09-20 NOTE — Patient Instructions (Signed)
Try Gaviscon
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
## 2017-09-21 IMAGING — CT CT HEAD W/O CM
1 series · 16 of 29 positions shown, 20 images · non-contrast
Comparison: 03/17/2014

CLINICAL DATA: Headache with dizziness and blurred vision.

EXAM:
CT HEAD WITHOUT CONTRAST
TECHNIQUE: Contiguous axial images were obtained from the base of the skull
through the vertex without intravenous contrast.

[Series 2: head wo · axial · 0.42mm/px · z∈[-130,+0]mm · 16 of 29 slices shown, 20 images]
[im 2/29  brain]
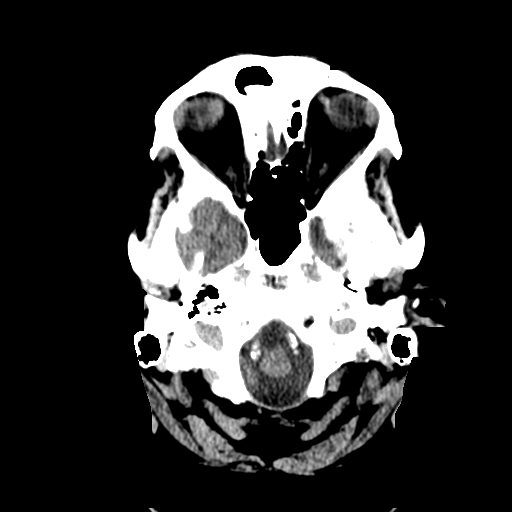
[im 2/29  bone]
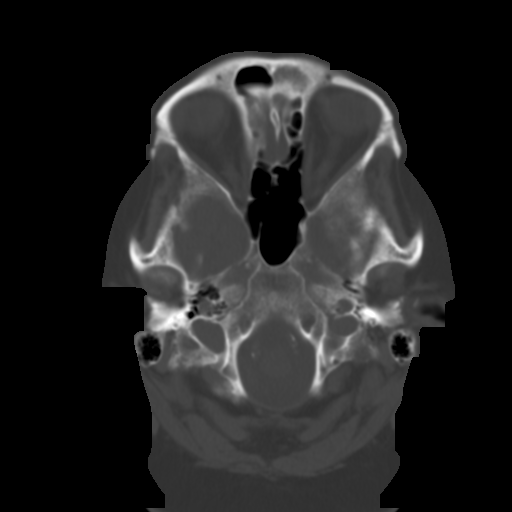
[im 4/29  brain]
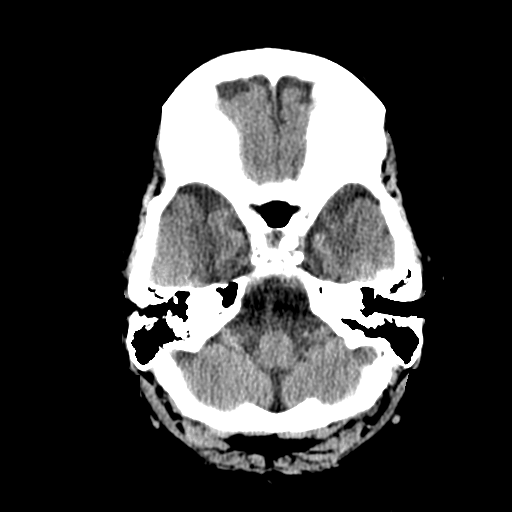
[im 6/29  brain]
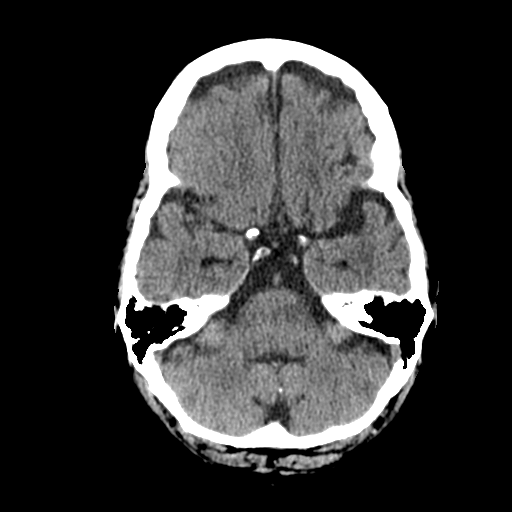
[im 7/29  brain]
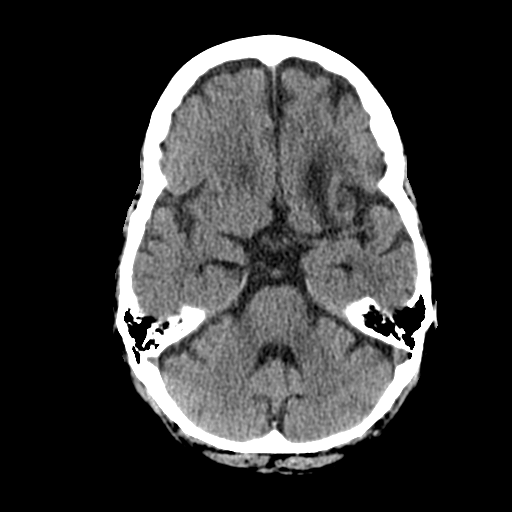
[im 9/29  brain]
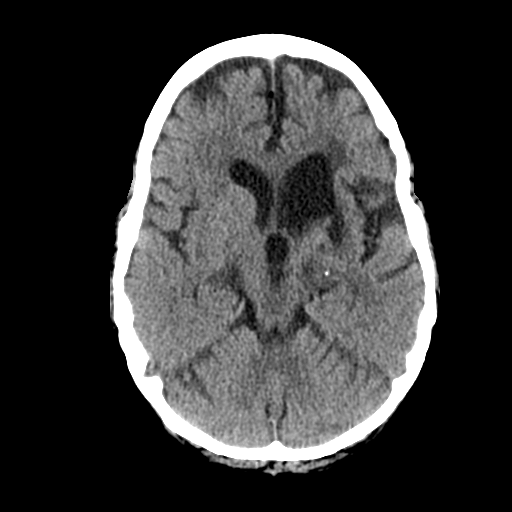
[im 9/29  bone]
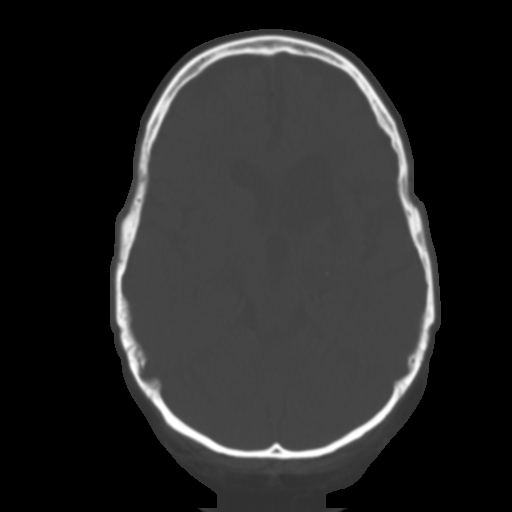
[im 11/29  brain]
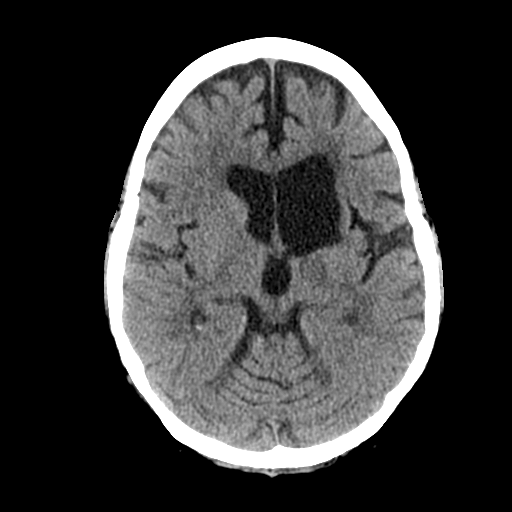
[im 12/29  brain]
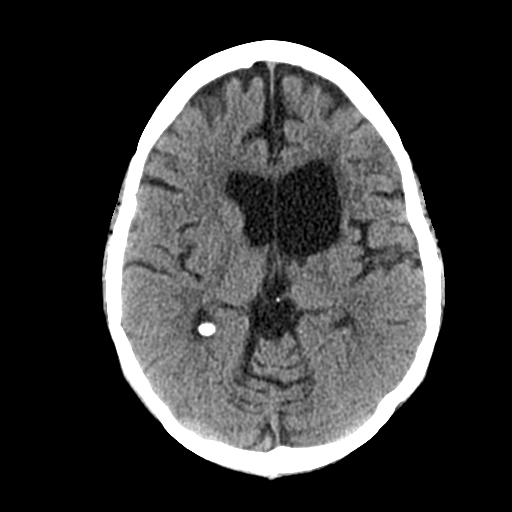
[im 14/29  brain]
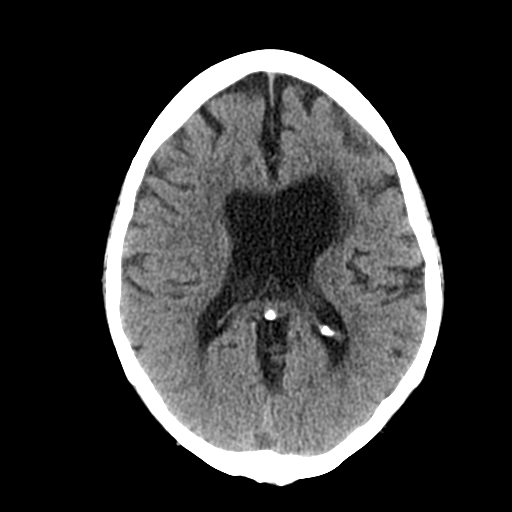
[im 16/29  brain]
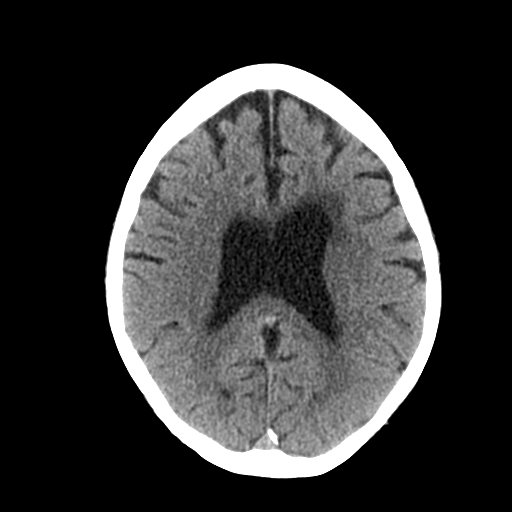
[im 16/29  bone]
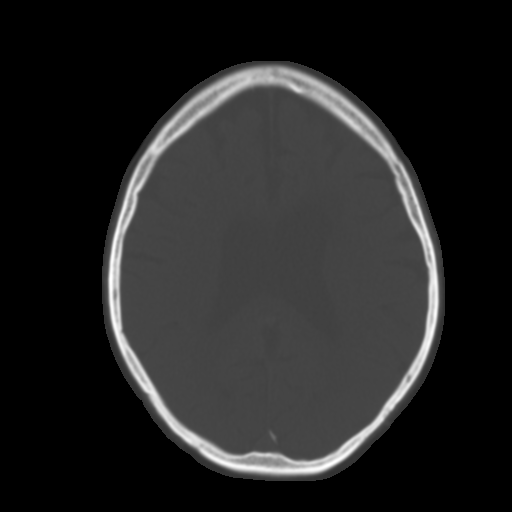
[im 18/29  brain]
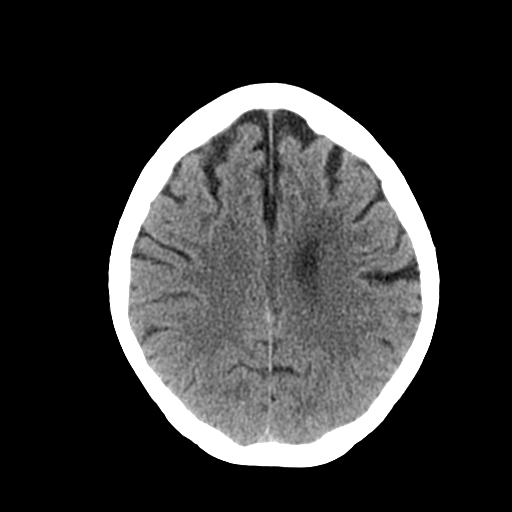
[im 19/29  brain]
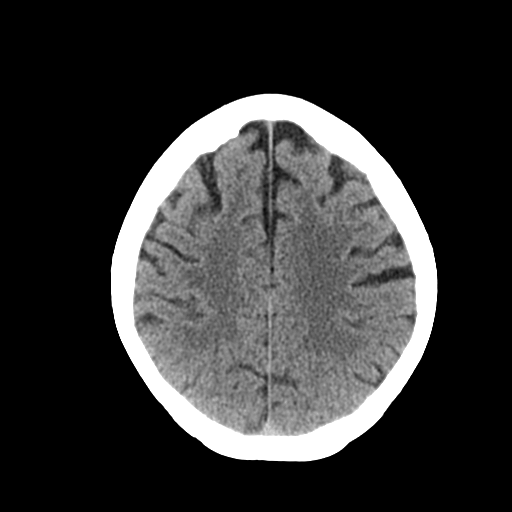
[im 21/29  brain]
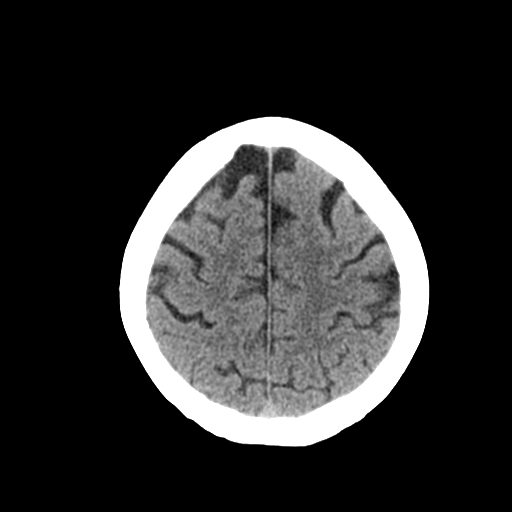
[im 23/29  brain]
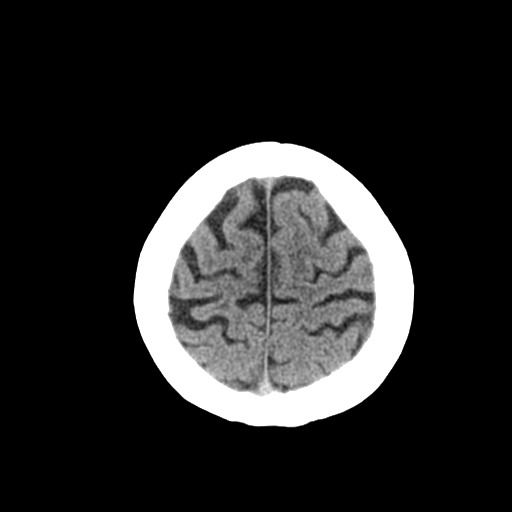
[im 23/29  bone]
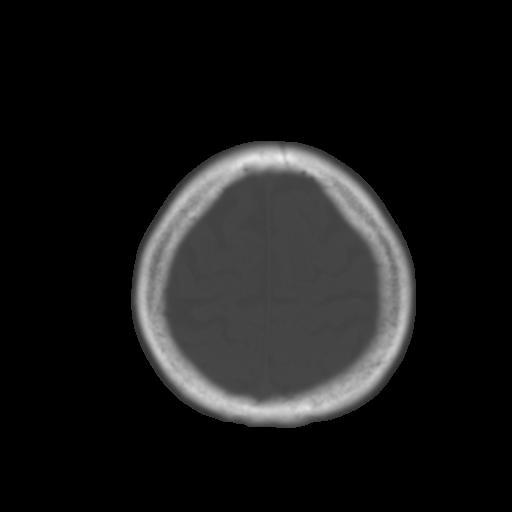
[im 24/29  brain]
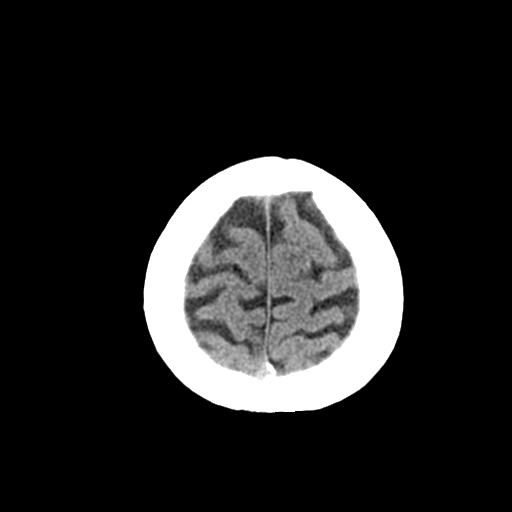
[im 26/29  brain]
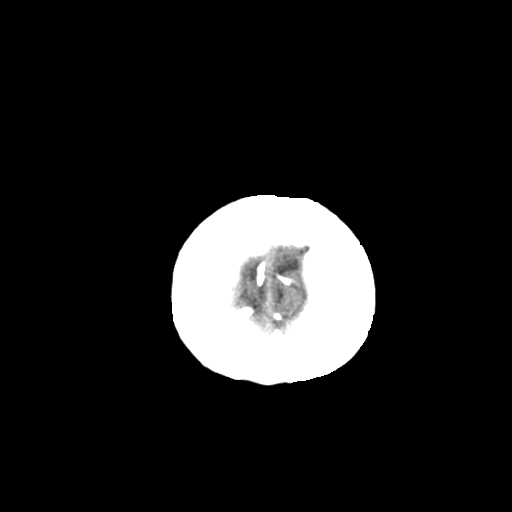
[im 28/29  brain]
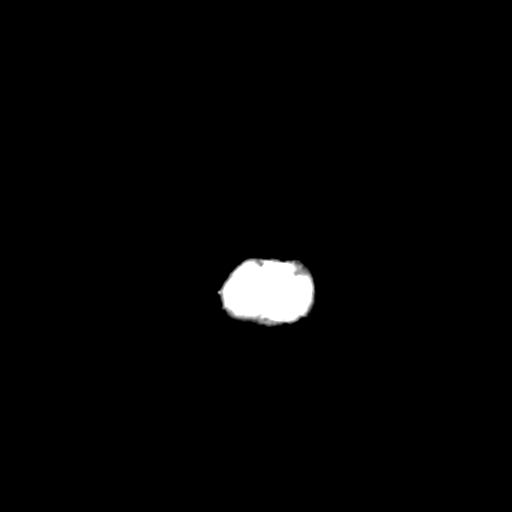

[16 of 29 positions shown; findings below may reference images not displayed]

FINDINGS: Skull and Sinuses:Negative for fracture or destructive process. The
visualized mastoids, middle ears, and imaged paranasal sinuses are
clear.

Visualized orbits: Negative.

Brain: No evidence of acute infarction, hemorrhage, hydrocephalus,
or mass lesion/mass effect.

Notable atrophy for age, generalized. Remote perforator infarct on
the left with atrophy and gliosis along the anterior limb internal
capsule and caudate/ putamen. Extensive calcified intracranial
atherosclerosis.
IMPRESSION: 1. No acute finding or change since [DATE]. Atrophy and remote left lenticulostriate infarct.

## 2017-09-25 ENCOUNTER — Encounter: Payer: Self-pay | Admitting: Family Medicine

## 2017-09-26 DIAGNOSIS — M5416 Radiculopathy, lumbar region: Secondary | ICD-10-CM | POA: Diagnosis not present

## 2017-09-26 DIAGNOSIS — M5136 Other intervertebral disc degeneration, lumbar region: Secondary | ICD-10-CM | POA: Diagnosis not present

## 2017-09-26 DIAGNOSIS — M48062 Spinal stenosis, lumbar region with neurogenic claudication: Secondary | ICD-10-CM | POA: Diagnosis not present

## 2017-09-26 DIAGNOSIS — M47816 Spondylosis without myelopathy or radiculopathy, lumbar region: Secondary | ICD-10-CM | POA: Diagnosis not present

## 2017-10-01 ENCOUNTER — Telehealth: Payer: Self-pay

## 2017-10-01 ENCOUNTER — Encounter: Payer: Self-pay | Admitting: Family Medicine

## 2017-10-01 ENCOUNTER — Ambulatory Visit (INDEPENDENT_AMBULATORY_CARE_PROVIDER_SITE_OTHER): Payer: PPO | Admitting: Family Medicine

## 2017-10-01 ENCOUNTER — Ambulatory Visit
Admission: RE | Admit: 2017-10-01 | Discharge: 2017-10-01 | Disposition: A | Discharge status: Home / Self Care | Payer: PPO | Source: Ambulatory Visit | Attending: Family Medicine | Admitting: Family Medicine

## 2017-10-01 ENCOUNTER — Ambulatory Visit: Payer: PPO | Admitting: Family Medicine

## 2017-10-01 VITALS — BP 160/82 | HR 71 | Temp 98.7°F | Resp 16

## 2017-10-01 DIAGNOSIS — E1122 Type 2 diabetes mellitus with diabetic chronic kidney disease: Secondary | ICD-10-CM | POA: Diagnosis not present

## 2017-10-01 DIAGNOSIS — I69359 Hemiplegia and hemiparesis following cerebral infarction affecting unspecified side: Secondary | ICD-10-CM

## 2017-10-01 DIAGNOSIS — R569 Unspecified convulsions: Secondary | ICD-10-CM

## 2017-10-01 DIAGNOSIS — Z87440 Personal history of urinary (tract) infections: Secondary | ICD-10-CM | POA: Diagnosis not present

## 2017-10-01 DIAGNOSIS — R4182 Altered mental status, unspecified: Secondary | ICD-10-CM

## 2017-10-01 DIAGNOSIS — N183 Chronic kidney disease, stage 3 unspecified: Secondary | ICD-10-CM

## 2017-10-01 DIAGNOSIS — W19XXXA Unspecified fall, initial encounter: Secondary | ICD-10-CM

## 2017-10-01 DIAGNOSIS — G9341 Metabolic encephalopathy: Secondary | ICD-10-CM | POA: Diagnosis not present

## 2017-10-01 DIAGNOSIS — I10 Essential (primary) hypertension: Secondary | ICD-10-CM | POA: Diagnosis not present

## 2017-10-01 DIAGNOSIS — I69398 Other sequelae of cerebral infarction: Secondary | ICD-10-CM

## 2017-10-01 LAB — POCT URINALYSIS DIPSTICK
BILIRUBIN UA: NEGATIVE
GLUCOSE UA: NEGATIVE
Ketones, UA: NEGATIVE
Leukocytes, UA: NEGATIVE
Nitrite, UA: NEGATIVE
Protein, UA: 300
RBC UA: NEGATIVE
SPEC GRAV UA: 1.02 (ref 1.010–1.025)
Urobilinogen, UA: 0.2 E.U./dL
pH, UA: 5 (ref 5.0–8.0)

## 2017-10-01 MED ORDER — HYDROCODONE-ACETAMINOPHEN 10-325 MG PO TABS: 1.0000 | ORAL_TABLET | Freq: Four times a day (QID) | ORAL | 0 refills | Status: DC | PRN

## 2017-10-01 NOTE — Progress Notes (Signed)
Patient: Susan House Female    DOB: Dec 05, 1955   62 y.o.   MRN: [id number] Visit Date: 10/02/2017  Today's Provider: Wilhemena Durie, MD   Chief Complaint  Patient presents with  . Fall  . Altered Mental Status   Subjective:    Fall  The accident occurred 3 to 5 days ago (On Friday). The fall occurred while standing (Once she step she got dizzy and fell). She fell from a height of 3 to 5 ft. She landed on concrete. There was no blood loss. The point of impact was the buttocks, neck, left shoulder and right shoulder (Upper back and lower back pain on a seat that was there). The pain is present in the back. The pain is at a severity of 7/10. The pain is moderate. The symptoms are aggravated by movement, sitting and pressure on injury. Pertinent negatives include no abdominal pain, bowel incontinence, fever, headaches, hematuria, loss of consciousness, nausea, numbness, tingling, visual change or vomiting. Associated symptoms comments: Reports the her upper back is bruised from hitting the bench that was in the back when she fell.. She has tried ice (Hydrocodone #4 ?) for the symptoms. The treatment provided no relief.  patient takes Hydro  Confusion: Per Patient she called this morning to scheduled appointment regarding her fall on Friday. She can't recall whether she told CMA that she had SOB. Per patient EMS came this morning and her blood sugar was level 51. She reports that this level was fasting. Reports that after she ate peanut butter toast to help with the sugar level and is feeling better.      Allergies  Allergen Reactions  . Morphine And Related Anaphylaxis  . Fentanyl Other (See Comments)    Unknown reaction.  . Reglan [Metoclopramide]     Other reaction(s): Unknown Elevated BP  . Simvastatin Other (See Comments)    Muscle pain  . Betadine [Povidone Iodine] Rash  . Iodine Rash    Other reaction(s): Unknown  . Tetracyclines & Related Rash    Other  reaction(s): Unknown     Current Outpatient Medications:  .  ACCU-CHEK AVIVA PLUS test strip, TEST BLOOD SUGAR THREE TIMES DAILY, Disp: 300 each, Rfl: 3 .  ACCU-CHEK SOFTCLIX LANCETS lancets, CHECK BLOOD SUGAR THREE TIMES DAILY, Disp: 300 each, Rfl: 3 .  amLODipine (NORVASC) 10 MG tablet, Take 1 tablet (10 mg total) by mouth daily., Disp: 90 tablet, Rfl: 3 .  aspirin 81 MG EC tablet, Take 1 tablet (81 mg total) by mouth daily. Swallow whole., Disp: 30 tablet, Rfl: 12 .  atorvastatin (LIPITOR) 40 MG tablet, Take 0.5 tablets (20 mg total) by mouth at bedtime., Disp: 90 tablet, Rfl: 3 .  Blood Glucose Monitoring Suppl (ACCU-CHEK AVIVA PLUS) w/Device KIT, Check sugar three times daily DX E11.9-needs a meter, Disp: 1 kit, Rfl: 0 .  diphenoxylate-atropine (LOMOTIL) 2.5-0.025 MG tablet, 2 tablets initially and then 1 tablet every 6 hours as needed for diarrhea, Disp: 60 tablet, Rfl: 1 .  enalapril (VASOTEC) 20 MG tablet, Take 1 tablet (20 mg total) by mouth 2 (two) times daily., Disp: 180 tablet, Rfl: 3 .  hydrALAZINE (APRESOLINE) 100 MG tablet, Take 1 tablet (100 mg total) by mouth 2 (two) times daily., Disp: 60 tablet, Rfl: 3 .  HYDROcodone-acetaminophen (NORCO) 10-325 MG tablet, Take 1-2 tablets by mouth every 6 (six) hours as needed., Disp: 60 tablet, Rfl: 0 .  insulin aspart protamine- aspart (NOVOLOG MIX  70/30) (70-30) 100 UNIT/ML injection, Inject 25-30 Units into the skin 2 (two) times daily. , Disp: , Rfl:  .  Lancet Devices (ACCU-CHEK SOFTCLIX) lancets, Check sugar three times daily DX E11.9, Disp: 100 each, Rfl: 3 .  levETIRAcetam (KEPPRA) 500 MG tablet, Take 1 tablet (500 mg total) by mouth 2 (two) times daily. 500 mg bid., Disp: 180 tablet, Rfl: 3 .  metFORMIN (GLUCOPHAGE) 1000 MG tablet, Take 1 tablet (1,000 mg total) by mouth 2 (two) times daily., Disp: 180 tablet, Rfl: 3 .  metoprolol succinate (TOPROL-XL) 100 MG 24 hr tablet, Take 1 tablet (100 mg total) by mouth daily., Disp: 90 tablet,  Rfl: 3 .  nitroGLYCERIN (NITROSTAT) 0.4 MG SL tablet, Place 1 tablet (0.4 mg total) under the tongue every 5 (five) minutes as needed for chest pain., Disp: 25 tablet, Rfl: 0 .  oxybutynin (DITROPAN) 5 MG tablet, Take 1 tablet (5 mg total) by mouth 2 (two) times daily., Disp: 180 tablet, Rfl: 3 .  pantoprazole (PROTONIX) 40 MG tablet, Take 1 tablet (40 mg total) by mouth daily., Disp: 90 tablet, Rfl: 3 .  triamcinolone cream (KENALOG) 0.1 %, Can use thin layer on affected areas 2x daily for one week at a time. Not for face., Disp: 30 g, Rfl: 0 .  acyclovir ointment (ZOVIRAX) 5 %, Apply 1 application topically every 3 (three) hours. (Patient not taking: Reported on 10/01/2017), Disp: 5 g, Rfl: 1 .  levETIRAcetam (KEPPRA) 750 MG tablet, TAKE ONE TABLET BY MOUTH TWO TIMES A DAY., Disp: 60 tablet, Rfl: 5  Review of Systems  Constitutional: Negative for fever.  HENT: Negative.   Eyes: Negative.   Cardiovascular: Negative for chest pain, palpitations and leg swelling.  Gastrointestinal: Negative for abdominal pain, bowel incontinence, nausea and vomiting.  Endocrine: Negative.   Genitourinary: Negative for hematuria.  Musculoskeletal: Positive for arthralgias and back pain.  Allergic/Immunologic: Negative.   Neurological: Negative for tingling, loss of consciousness, numbness and headaches.  Psychiatric/Behavioral: Negative.     Social History   Tobacco Use  . Smoking status: Never Smoker  . Smokeless tobacco: Never Used  Substance Use Topics  . Alcohol use: No   Objective:   BP (!) 160/82 (BP Location: Right Arm, Patient Position: Sitting, Cuff Size: Normal)   Pulse 71   Temp 98.7 F (37.1 C) (Oral)   Resp 16   SpO2 97%    Physical Exam  Constitutional: She appears well-developed and well-nourished.  HENT:  Head: Normocephalic and atraumatic.  Eyes: Conjunctivae are normal. No scleral icterus.  Neck: No thyromegaly present.  Cardiovascular: Normal rate, regular rhythm and  normal heart sounds.  Pulmonary/Chest: Effort normal and breath sounds normal.  Abdominal: Soft.  Musculoskeletal: She exhibits no deformity.  Mild tenderness of thoracic and LS spine.  Lymphadenopathy:    She has no cervical adenopathy.  Neurological:  Chronic right side weakness. Stable exam today.  Skin: Skin is warm and dry.  Bruising across left upper back--  Psychiatric: She has a normal mood and affect. Her behavior is normal. Judgment and thought content normal.        Assessment & Plan:  1. Altered mental status, unspecified altered mental status type Possible hypoglycemia.h/o seizures--followed by neurology. 2. History of UTI Possibly contributing to #1.  - POCT urinalysis dipstick  3. 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 Development worker, community has been used in this note in any air is in the dictation or transcription are unintentional.  Wilhemena Durie, MD  [place]

## 2017-10-01 NOTE — Telephone Encounter (Signed)
Late entry- The patient called the office stating she was having difficulty breathing.  The phone team asked for clinical to take the call as she wasn't making sense to them.  They reported she continually repeated that she was having difficulty breathing.  Clinical took the call and asked the phone team to call rescue and have them go to the patient's home to check on her.   Patient reported she was having difficulty breathing.  When asked if she was short of breath,. Having trouble catching or breath all she would say was that she was having difficulty breathing.  When asked about anything else that is what she answered.  I spoke to the husband and he said he wasn't aware of her having any difficulty.  I advised him that we had rescue coming to the home and he stated he had to get off the phone to dress.  I wanted to stay on the phone so I continued back with the patient.  I asked her if she was having trouble breathing and she then said no.  I asked why she called the office and she said difficulty breathing.  I could not detect her having any trouble with anything except some confusion.   When rescue arrived they checked her glucose and it was 50 something.  I told them that was low for her and they found her glucometer and the previous few readings were all over 200.  They also believed she appeared to be presenting with possible UTI.  The said they would assess but if she refused transport as she has in the past they would recommend her coming to see [place].   Call was later received that she would make appointment for today.

## 2017-10-02 ENCOUNTER — Telehealth: Payer: Self-pay

## 2017-10-02 NOTE — Telephone Encounter (Signed)
Imaging center called that they don't do the Thoracic 4V-only the one with 2V or the Thoracic with 2V. Per Dr.Gilbert is ok to do the 2V. Gave verbal order and they are going to change it and call patient.  Thanks,  -Breeze Angell

## 2017-10-03 ENCOUNTER — Ambulatory Visit (INDEPENDENT_AMBULATORY_CARE_PROVIDER_SITE_OTHER): Payer: PPO | Admitting: Physician Assistant

## 2017-10-03 ENCOUNTER — Observation Stay
Admission: EM | Admit: 2017-10-03 | Discharge: 2017-10-04 | Disposition: A | Discharge status: Home / Self Care | Payer: PPO | Attending: Family Medicine | Admitting: Family Medicine

## 2017-10-03 ENCOUNTER — Other Ambulatory Visit: Payer: Self-pay

## 2017-10-03 ENCOUNTER — Ambulatory Visit
Admission: RE | Admit: 2017-10-03 | Discharge: 2017-10-03 | Disposition: A | Discharge status: Home / Self Care | Payer: PPO | Source: Ambulatory Visit | Attending: Family Medicine | Admitting: Family Medicine

## 2017-10-03 ENCOUNTER — Telehealth: Payer: Self-pay

## 2017-10-03 ENCOUNTER — Encounter: Payer: Self-pay | Admitting: Physician Assistant

## 2017-10-03 ENCOUNTER — Emergency Department: Payer: PPO

## 2017-10-03 VITALS — BP 110/62 | HR 52 | Temp 97.4°F | Resp 16

## 2017-10-03 DIAGNOSIS — Z7984 Long term (current) use of oral hypoglycemic drugs: Secondary | ICD-10-CM | POA: Insufficient documentation

## 2017-10-03 DIAGNOSIS — M7918 Myalgia, other site: Secondary | ICD-10-CM | POA: Diagnosis not present

## 2017-10-03 DIAGNOSIS — E118 Type 2 diabetes mellitus with unspecified complications: Secondary | ICD-10-CM | POA: Diagnosis not present

## 2017-10-03 DIAGNOSIS — E1122 Type 2 diabetes mellitus with diabetic chronic kidney disease: Secondary | ICD-10-CM | POA: Diagnosis not present

## 2017-10-03 DIAGNOSIS — Z8614 Personal history of Methicillin resistant Staphylococcus aureus infection: Secondary | ICD-10-CM | POA: Insufficient documentation

## 2017-10-03 DIAGNOSIS — G2581 Restless legs syndrome: Secondary | ICD-10-CM | POA: Diagnosis not present

## 2017-10-03 DIAGNOSIS — Z7902 Long term (current) use of antithrombotics/antiplatelets: Secondary | ICD-10-CM | POA: Insufficient documentation

## 2017-10-03 DIAGNOSIS — Z888 Allergy status to other drugs, medicaments and biological substances status: Secondary | ICD-10-CM | POA: Insufficient documentation

## 2017-10-03 DIAGNOSIS — N183 Chronic kidney disease, stage 3 (moderate): Secondary | ICD-10-CM | POA: Diagnosis not present

## 2017-10-03 DIAGNOSIS — R001 Bradycardia, unspecified: Secondary | ICD-10-CM

## 2017-10-03 DIAGNOSIS — R410 Disorientation, unspecified: Secondary | ICD-10-CM

## 2017-10-03 DIAGNOSIS — Z794 Long term (current) use of insulin: Secondary | ICD-10-CM | POA: Insufficient documentation

## 2017-10-03 DIAGNOSIS — R110 Nausea: Secondary | ICD-10-CM

## 2017-10-03 DIAGNOSIS — Z79899 Other long term (current) drug therapy: Secondary | ICD-10-CM | POA: Insufficient documentation

## 2017-10-03 DIAGNOSIS — I69354 Hemiplegia and hemiparesis following cerebral infarction affecting left non-dominant side: Secondary | ICD-10-CM | POA: Insufficient documentation

## 2017-10-03 DIAGNOSIS — I639 Cerebral infarction, unspecified: Secondary | ICD-10-CM | POA: Diagnosis not present

## 2017-10-03 DIAGNOSIS — G459 Transient cerebral ischemic attack, unspecified: Secondary | ICD-10-CM | POA: Diagnosis not present

## 2017-10-03 DIAGNOSIS — E11319 Type 2 diabetes mellitus with unspecified diabetic retinopathy without macular edema: Secondary | ICD-10-CM | POA: Diagnosis not present

## 2017-10-03 DIAGNOSIS — I350 Nonrheumatic aortic (valve) stenosis: Secondary | ICD-10-CM | POA: Diagnosis not present

## 2017-10-03 DIAGNOSIS — R4781 Slurred speech: Secondary | ICD-10-CM | POA: Diagnosis not present

## 2017-10-03 DIAGNOSIS — Z6841 Body Mass Index (BMI) 40.0 and over, adult: Secondary | ICD-10-CM | POA: Insufficient documentation

## 2017-10-03 DIAGNOSIS — Z885 Allergy status to narcotic agent status: Secondary | ICD-10-CM | POA: Insufficient documentation

## 2017-10-03 DIAGNOSIS — K589 Irritable bowel syndrome without diarrhea: Secondary | ICD-10-CM | POA: Insufficient documentation

## 2017-10-03 DIAGNOSIS — Z91041 Radiographic dye allergy status: Secondary | ICD-10-CM | POA: Insufficient documentation

## 2017-10-03 DIAGNOSIS — G9389 Other specified disorders of brain: Secondary | ICD-10-CM | POA: Insufficient documentation

## 2017-10-03 DIAGNOSIS — I129 Hypertensive chronic kidney disease with stage 1 through stage 4 chronic kidney disease, or unspecified chronic kidney disease: Secondary | ICD-10-CM | POA: Diagnosis not present

## 2017-10-03 DIAGNOSIS — G3189 Other specified degenerative diseases of nervous system: Secondary | ICD-10-CM | POA: Diagnosis not present

## 2017-10-03 DIAGNOSIS — M1990 Unspecified osteoarthritis, unspecified site: Secondary | ICD-10-CM | POA: Insufficient documentation

## 2017-10-03 DIAGNOSIS — R4182 Altered mental status, unspecified: Secondary | ICD-10-CM

## 2017-10-03 DIAGNOSIS — R262 Difficulty in walking, not elsewhere classified: Secondary | ICD-10-CM | POA: Diagnosis not present

## 2017-10-03 DIAGNOSIS — I700 Atherosclerosis of aorta: Secondary | ICD-10-CM | POA: Diagnosis not present

## 2017-10-03 DIAGNOSIS — R531 Weakness: Secondary | ICD-10-CM

## 2017-10-03 DIAGNOSIS — Z881 Allergy status to other antibiotic agents status: Secondary | ICD-10-CM | POA: Insufficient documentation

## 2017-10-03 DIAGNOSIS — I672 Cerebral atherosclerosis: Secondary | ICD-10-CM | POA: Diagnosis not present

## 2017-10-03 DIAGNOSIS — I10 Essential (primary) hypertension: Secondary | ICD-10-CM | POA: Diagnosis not present

## 2017-10-03 DIAGNOSIS — G40909 Epilepsy, unspecified, not intractable, without status epilepticus: Secondary | ICD-10-CM | POA: Diagnosis not present

## 2017-10-03 DIAGNOSIS — M4804 Spinal stenosis, thoracic region: Secondary | ICD-10-CM | POA: Diagnosis not present

## 2017-10-03 DIAGNOSIS — K219 Gastro-esophageal reflux disease without esophagitis: Secondary | ICD-10-CM | POA: Insufficient documentation

## 2017-10-03 DIAGNOSIS — I6523 Occlusion and stenosis of bilateral carotid arteries: Secondary | ICD-10-CM | POA: Insufficient documentation

## 2017-10-03 DIAGNOSIS — Z7982 Long term (current) use of aspirin: Secondary | ICD-10-CM | POA: Insufficient documentation

## 2017-10-03 DIAGNOSIS — E785 Hyperlipidemia, unspecified: Secondary | ICD-10-CM | POA: Diagnosis not present

## 2017-10-03 DIAGNOSIS — S3992XA Unspecified injury of lower back, initial encounter: Secondary | ICD-10-CM | POA: Diagnosis not present

## 2017-10-03 DIAGNOSIS — I708 Atherosclerosis of other arteries: Secondary | ICD-10-CM | POA: Insufficient documentation

## 2017-10-03 DIAGNOSIS — M542 Cervicalgia: Secondary | ICD-10-CM | POA: Diagnosis not present

## 2017-10-03 DIAGNOSIS — E1151 Type 2 diabetes mellitus with diabetic peripheral angiopathy without gangrene: Secondary | ICD-10-CM | POA: Insufficient documentation

## 2017-10-03 DIAGNOSIS — R569 Unspecified convulsions: Secondary | ICD-10-CM | POA: Diagnosis not present

## 2017-10-03 DIAGNOSIS — W19XXXA Unspecified fall, initial encounter: Secondary | ICD-10-CM

## 2017-10-03 LAB — CBC
HCT: 35.1 % (ref 35.0–47.0)
Hemoglobin: 11.7 g/dL — ABNORMAL LOW (ref 12.0–16.0)
MCH: 28.9 pg (ref 26.0–34.0)
MCHC: 33.4 g/dL (ref 32.0–36.0)
MCV: 86.5 fL (ref 80.0–100.0)
PLATELETS: 213 10*3/uL (ref 150–440)
RBC: 4.06 MIL/uL (ref 3.80–5.20)
RDW: 14.9 % — ABNORMAL HIGH (ref 11.5–14.5)
WBC: 7.1 10*3/uL (ref 3.6–11.0)

## 2017-10-03 LAB — COMPREHENSIVE METABOLIC PANEL
ALBUMIN: 3.6 g/dL (ref 3.5–5.0)
ALT: 23 U/L (ref 14–54)
ANION GAP: 8 (ref 5–15)
AST: 27 U/L (ref 15–41)
Alkaline Phosphatase: 68 U/L (ref 38–126)
BUN: 23 mg/dL — ABNORMAL HIGH (ref 6–20)
CO2: 25 mmol/L (ref 22–32)
Calcium: 8.4 mg/dL — ABNORMAL LOW (ref 8.9–10.3)
Chloride: 105 mmol/L (ref 101–111)
Creatinine, Ser: 0.95 mg/dL (ref 0.44–1.00)
GFR calc Af Amer: >60 mL/min (ref >60)
GFR calc non Af Amer: >60 mL/min (ref >60)
GLUCOSE: 94 mg/dL (ref 65–99)
Potassium: 3.4 mmol/L — ABNORMAL LOW (ref 3.5–5.1)
SODIUM: 138 mmol/L (ref 135–145)
TOTAL PROTEIN: 6.7 g/dL (ref 6.5–8.1)
Total Bilirubin: 0.5 mg/dL (ref 0.3–1.2)

## 2017-10-03 LAB — GLUCOSE, CAPILLARY
Glucose-Capillary: 113 mg/dL — ABNORMAL HIGH (ref 65–99)
Glucose-Capillary: 31 mg/dL — CL (ref 65–99)

## 2017-10-03 LAB — DIFFERENTIAL
Basophils Absolute: 0 10*3/uL (ref 0–0.1)
Basophils Relative: 1 %
EOS ABS: 0.3 10*3/uL (ref 0–0.7)
EOS PCT: 5 %
Lymphocytes Relative: 26 %
Lymphs Abs: 1.8 10*3/uL (ref 1.0–3.6)
Monocytes Absolute: 0.8 10*3/uL (ref 0.2–0.9)
Monocytes Relative: 11 %
NEUTROS PCT: 57 %
Neutro Abs: 4.1 10*3/uL (ref 1.4–6.5)

## 2017-10-03 LAB — APTT: aPTT: 28 seconds (ref 24–36)

## 2017-10-03 LAB — PROTIME-INR
INR: 0.95
Prothrombin Time: 12.6 seconds (ref 11.4–15.2)

## 2017-10-03 LAB — TROPONIN I: Troponin I: <0.03 ng/mL (ref <0.03)

## 2017-10-03 MED ORDER — ONDANSETRON HCL 4 MG/2ML IJ SOLN
4.0000 mg | Freq: Once | INTRAMUSCULAR | Status: AC
  Administered 2017-10-03: 4 mg via INTRAVENOUS
  Filled 2017-10-03: qty 2

## 2017-10-03 MED ORDER — ENOXAPARIN SODIUM 40 MG/0.4ML ~~LOC~~ SOLN
40.0000 mg | Freq: Two times a day (BID) | SUBCUTANEOUS | Status: DC
  Administered 2017-10-04 (×2): 40 mg via SUBCUTANEOUS
  Filled 2017-10-03 (×2): qty 0.4

## 2017-10-03 MED ORDER — DEXTROSE 50 % IV SOLN
INTRAVENOUS | Status: AC
  Administered 2017-10-04: 25 g via INTRAVENOUS
  Filled 2017-10-03: qty 50

## 2017-10-03 MED ORDER — OXYBUTYNIN CHLORIDE 5 MG PO TABS
5.0000 mg | ORAL_TABLET | Freq: Two times a day (BID) | ORAL | Status: DC
  Administered 2017-10-04 (×2): 5 mg via ORAL
  Filled 2017-10-03 (×2): qty 1

## 2017-10-03 MED ORDER — DIPHENOXYLATE-ATROPINE 2.5-0.025 MG PO TABS: 2.0000 | ORAL_TABLET | Freq: Four times a day (QID) | ORAL | Status: DC | PRN

## 2017-10-03 MED ORDER — METFORMIN HCL 500 MG PO TABS
1000.0000 mg | ORAL_TABLET | Freq: Two times a day (BID) | ORAL | Status: DC
  Administered 2017-10-04: 09:00:00 1000 mg via ORAL
  Filled 2017-10-03 (×2): qty 2

## 2017-10-03 MED ORDER — ASPIRIN 81 MG PO CHEW
324.0000 mg | CHEWABLE_TABLET | Freq: Once | ORAL | Status: AC
Start: 2017-10-03 — End: 2017-10-03
  Administered 2017-10-03: 324 mg via ORAL
  Filled 2017-10-03: qty 4

## 2017-10-03 MED ORDER — ENALAPRIL MALEATE 20 MG PO TABS
20.0000 mg | ORAL_TABLET | Freq: Two times a day (BID) | ORAL | Status: DC
  Administered 2017-10-04: 20 mg via ORAL
  Filled 2017-10-03 (×3): qty 1

## 2017-10-03 MED ORDER — STROKE: EARLY STAGES OF RECOVERY BOOK
Freq: Once | Status: AC
  Administered 2017-10-03: 23:00:00

## 2017-10-03 MED ORDER — ATORVASTATIN CALCIUM 20 MG PO TABS
20.0000 mg | ORAL_TABLET | Freq: Every day | ORAL | Status: DC
  Administered 2017-10-04 (×2): 20 mg via ORAL
  Filled 2017-10-03 (×2): qty 1

## 2017-10-03 MED ORDER — SODIUM CHLORIDE 0.9 % IV BOLUS (SEPSIS)
1000.0000 mL | Freq: Once | INTRAVENOUS | Status: AC
  Administered 2017-10-03: 1000 mL via INTRAVENOUS

## 2017-10-03 MED ORDER — ACETAMINOPHEN 160 MG/5ML PO SOLN
650.0000 mg | ORAL | Status: DC | PRN
  Filled 2017-10-03: qty 20.3

## 2017-10-03 MED ORDER — ACETAMINOPHEN 650 MG RE SUPP: 650.0000 mg | RECTAL | Status: DC | PRN

## 2017-10-03 MED ORDER — PANTOPRAZOLE SODIUM 40 MG PO TBEC
40.0000 mg | DELAYED_RELEASE_TABLET | Freq: Every day | ORAL | Status: DC
  Administered 2017-10-04: 09:00:00 40 mg via ORAL
  Filled 2017-10-03: qty 1

## 2017-10-03 MED ORDER — HYDRALAZINE HCL 50 MG PO TABS
100.0000 mg | ORAL_TABLET | Freq: Two times a day (BID) | ORAL | Status: DC
  Administered 2017-10-04: 100 mg via ORAL
  Filled 2017-10-03: qty 2

## 2017-10-03 MED ORDER — HYDROCODONE-ACETAMINOPHEN 10-325 MG PO TABS: 1.0000 | ORAL_TABLET | Freq: Four times a day (QID) | ORAL | Status: DC | PRN

## 2017-10-03 MED ORDER — AMLODIPINE BESYLATE 10 MG PO TABS
10.0000 mg | ORAL_TABLET | Freq: Every day | ORAL | Status: DC
  Administered 2017-10-04: 09:00:00 10 mg via ORAL
  Filled 2017-10-03: qty 1

## 2017-10-03 MED ORDER — LEVETIRACETAM 750 MG PO TABS
750.0000 mg | ORAL_TABLET | Freq: Two times a day (BID) | ORAL | Status: DC
  Administered 2017-10-04 (×2): 750 mg via ORAL
  Filled 2017-10-03 (×3): qty 1

## 2017-10-03 MED ORDER — INSULIN ASPART 100 UNIT/ML ~~LOC~~ SOLN
0.0000 [IU] | Freq: Three times a day (TID) | SUBCUTANEOUS | Status: DC
  Administered 2017-10-04 (×2): 2 [IU] via SUBCUTANEOUS
  Filled 2017-10-03 (×2): qty 1

## 2017-10-03 MED ORDER — METOPROLOL SUCCINATE ER 50 MG PO TB24
100.0000 mg | ORAL_TABLET | Freq: Every day | ORAL | Status: DC
  Administered 2017-10-04: 100 mg via ORAL
  Filled 2017-10-03: qty 2

## 2017-10-03 MED ORDER — ASPIRIN EC 81 MG PO TBEC
81.0000 mg | DELAYED_RELEASE_TABLET | Freq: Every day | ORAL | Status: DC
  Administered 2017-10-04: 09:00:00 81 mg via ORAL
  Filled 2017-10-03: qty 1

## 2017-10-03 MED ORDER — ACETAMINOPHEN 325 MG PO TABS: 650.0000 mg | ORAL_TABLET | ORAL | Status: DC | PRN

## 2017-10-03 MED ORDER — NITROGLYCERIN 0.4 MG SL SUBL: 0.4000 mg | SUBLINGUAL_TABLET | SUBLINGUAL | Status: DC | PRN

## 2017-10-03 NOTE — Progress Notes (Signed)
Patient: Susan House Female    DOB: 12-05-1955   62 y.o.   MRN: [id number] Visit Date: 10/03/2017  Today's Provider: Trinna Post, PA-C   Chief Complaint  Patient presents with  . Fall    Pt fell 09/28/2017   Subjective:    Susan House is a 62 y/o woman with a history of CVA with residual hemiparesis, Type II DM, and seizures who presents today after a fall on Friday. She reports that she lost her balance and landed on her back. Today, she reports confusion since lunch time. She says she had trouble finding words and was misspeaking. She says her vision is "snowy" which is not normal for her. She says her left arm is weaker as well as both of her legs. She does not have a headache. She had episodes of confusion over the weekend and EMS was called. Her blood sugar was low and symptoms resolved with snack. She did not go to the hospital. She wonders if she had a stroke or if it may be secondary effects from her fall.   Fall  The accident occurred 5 to 7 days ago. Fall occurred: Pt reports stepping of a step and lost her balance.   She landed on concrete. There was no blood loss. The point of impact was the buttocks (Back). The pain is present in the back and buttocks. The pain is at a severity of 8/10. The symptoms are aggravated by movement. Pertinent negatives include no abdominal pain, bowel incontinence, fever, headaches, hearing loss, hematuria, loss of consciousness, nausea, numbness, tingling, visual change or vomiting.       Allergies  Allergen Reactions  . Morphine And Related Anaphylaxis  . Fentanyl Other (See Comments)    Unknown reaction.  . Reglan [Metoclopramide]     Other reaction(s): Unknown Elevated BP  . Simvastatin Other (See Comments)    Muscle pain  . Betadine [Povidone Iodine] Rash  . Iodine Rash    Other reaction(s): Unknown  . Tetracyclines & Related Rash    Other reaction(s): Unknown     Current Outpatient Medications:  .   ACCU-CHEK AVIVA PLUS test strip, TEST BLOOD SUGAR THREE TIMES DAILY, Disp: 300 each, Rfl: 3 .  ACCU-CHEK SOFTCLIX LANCETS lancets, CHECK BLOOD SUGAR THREE TIMES DAILY, Disp: 300 each, Rfl: 3 .  acyclovir ointment (ZOVIRAX) 5 %, Apply 1 application topically every 3 (three) hours., Disp: 5 g, Rfl: 1 .  amLODipine (NORVASC) 10 MG tablet, Take 1 tablet (10 mg total) by mouth daily., Disp: 90 tablet, Rfl: 3 .  aspirin 81 MG EC tablet, Take 1 tablet (81 mg total) by mouth daily. Swallow whole., Disp: 30 tablet, Rfl: 12 .  atorvastatin (LIPITOR) 40 MG tablet, Take 0.5 tablets (20 mg total) by mouth at bedtime., Disp: 90 tablet, Rfl: 3 .  Blood Glucose Monitoring Suppl (ACCU-CHEK AVIVA PLUS) w/Device KIT, Check sugar three times daily DX E11.9-needs a meter, Disp: 1 kit, Rfl: 0 .  diphenoxylate-atropine (LOMOTIL) 2.5-0.025 MG tablet, 2 tablets initially and then 1 tablet every 6 hours as needed for diarrhea, Disp: 60 tablet, Rfl: 1 .  enalapril (VASOTEC) 20 MG tablet, Take 1 tablet (20 mg total) by mouth 2 (two) times daily., Disp: 180 tablet, Rfl: 3 .  hydrALAZINE (APRESOLINE) 100 MG tablet, Take 1 tablet (100 mg total) by mouth 2 (two) times daily., Disp: 60 tablet, Rfl: 3 .  HYDROcodone-acetaminophen (NORCO) 10-325 MG tablet, Take 1-2  tablets by mouth every 6 (six) hours as needed., Disp: 60 tablet, Rfl: 0 .  insulin aspart protamine- aspart (NOVOLOG MIX 70/30) (70-30) 100 UNIT/ML injection, Inject 25-30 Units into the skin 2 (two) times daily. , Disp: , Rfl:  .  Lancet Devices (ACCU-CHEK SOFTCLIX) lancets, Check sugar three times daily DX E11.9, Disp: 100 each, Rfl: 3 .  levETIRAcetam (KEPPRA) 500 MG tablet, Take 1 tablet (500 mg total) by mouth 2 (two) times daily. 500 mg bid., Disp: 180 tablet, Rfl: 3 .  levETIRAcetam (KEPPRA) 750 MG tablet, TAKE ONE TABLET BY MOUTH TWO TIMES A DAY., Disp: 60 tablet, Rfl: 5 .  metFORMIN (GLUCOPHAGE) 1000 MG tablet, Take 1 tablet (1,000 mg total) by mouth 2 (two) times  daily., Disp: 180 tablet, Rfl: 3 .  metoprolol succinate (TOPROL-XL) 100 MG 24 hr tablet, Take 1 tablet (100 mg total) by mouth daily., Disp: 90 tablet, Rfl: 3 .  nitroGLYCERIN (NITROSTAT) 0.4 MG SL tablet, Place 1 tablet (0.4 mg total) under the tongue every 5 (five) minutes as needed for chest pain., Disp: 25 tablet, Rfl: 0 .  oxybutynin (DITROPAN) 5 MG tablet, Take 1 tablet (5 mg total) by mouth 2 (two) times daily., Disp: 180 tablet, Rfl: 3 .  pantoprazole (PROTONIX) 40 MG tablet, Take 1 tablet (40 mg total) by mouth daily., Disp: 90 tablet, Rfl: 3 .  triamcinolone cream (KENALOG) 0.1 %, Can use thin layer on affected areas 2x daily for one week at a time. Not for face., Disp: 30 g, Rfl: 0  Review of Systems  Constitutional: Positive for fatigue. Negative for activity change, appetite change, chills, diaphoresis, fever and unexpected weight change.  Gastrointestinal: Negative.  Negative for abdominal pain, bowel incontinence, nausea and vomiting.  Genitourinary: Negative for hematuria.  Neurological: Positive for dizziness, speech difficulty (At lunch time today.) and weakness (Increased weakness in legs and left arm.). Negative for tingling, tremors, seizures, loss of consciousness, syncope, facial asymmetry, light-headedness, numbness and headaches.  Psychiatric/Behavioral: Positive for confusion (Increased confusion at lunch time.).    Social History   Tobacco Use  . Smoking status: Never Smoker  . Smokeless tobacco: Never Used  Substance Use Topics  . Alcohol use: No   Objective:   BP 110/62 (BP Location: Right Arm, Patient Position: Sitting, Cuff Size: Large)   Pulse (!) 52   Temp (!) 97.4 F (36.3 C) (Oral)   Resp 16   SpO2 97%  Vitals:   10/03/17 1625  BP: 110/62  Pulse: (!) 52  Resp: 16  Temp: (!) 97.4 F (36.3 C)  TempSrc: Oral  SpO2: 97%     Physical Exam  Constitutional: She is oriented to person, place, and time. She appears well-developed and  well-nourished.  Eyes: Pupils are equal, round, and reactive to light.  Cardiovascular: Regular rhythm. Bradycardia present.  Neurological: She is alert and oriented to person, place, and time. She displays no tremor. A cranial nerve deficit is present. She exhibits abnormal muscle tone. She displays no seizure activity.  Left side mouth slightly downturned. 4/5 strength in left upper extremity, 5/5 strength in right upper extremity. 4/5 strength bilateral lower extremities.   Skin: Skin is warm and dry.  Psychiatric: She has a normal mood and affect.        Assessment & Plan:     1. Altered mental status, unspecified altered mental status type  Woman with history of stroke presenting today with increased confusion, changes in speech and vision and increased weakness. Last  known normal was around lunch time. She reports these changes have persisted from at least since lunch time. She presents a difficult clinical picture because she does have a history of CVA with residual hemiparesis on exam and TIA noted in her chart. Her pulse and blood pressure are both lower today than normal. Her random glucose is 158. I have recommended her to go to the ER for workup, especially as the patient herself is concerned for possibility of stroke. She declines EMS services and reports her husband, who is present in the clinic today, will take her. I have called ahead to the ER.  2. Confusion   3. Weakness  Return if symptoms worsen or fail to improve.  The entirety of the information documented in the History of Present Illness, Review of Systems and Physical Exam were personally obtained by me. Portions of this information were initially documented by Ashley Royalty, CMA and reviewed by me for thoroughness and accuracy.          Trinna Post, PA-C  [facility]

## 2017-10-03 NOTE — H&P (Signed)
[facility] NAME: Susan House    MR#:  [id number]  DATE OF BIRTH:  [birth year]  DATE OF ADMISSION:  10/03/2017  PRIMARY CARE PHYSICIAN: Jerrol Banana., MD   REQUESTING/REFERRING PHYSICIAN: Eula Listen, MD  CHIEF COMPLAINT:   Chief Complaint  Patient presents with  . Weakness  . Altered Mental Status    HISTORY OF PRESENT ILLNESS: Susan House  is a 62 y.o. female with a known history of previous history of stroke with chronic left-sided weakness who is presenting with complaint of worsening weakness on the left side since this morning at 10 AM.  Patient reports that she had a stroke about 19 years ago.  Which initially left her with significant weakness in the left upper and left lower extremity requiring rehab.  Subsequently her strength improved.  However this morning patient started to have having worsening weakness of the left upper extremity and left lower extremity.  She also started having slurred speech.  Patient came to the emergency room went to see symptoms and CT scan of the head was done which was negative.    PAST MEDICAL HISTORY:   Past Medical History:  Diagnosis Date  . Allergy   . Arthritis   . Cellulitis and abscess of face   . Depression   . Diabetes mellitus ([place])   . Edema   . GERD (gastroesophageal reflux disease)   . Hematuria   . History of echocardiogram    2008: normal, 2011: normal LVSF  . History of nuclear stress test    a. 12/2009: lexiscan - negative  . Hyperlipidemia   . Hypertension   . IBS (irritable bowel syndrome)   . Migraine   . Morbid obesity ([place])   . Nocturia   . Stroke ([place]) 2000  . TIA (transient ischemic attack) 2010  . Urgency of micturation   . Urinary frequency   . Urinary incontinence     PAST SURGICAL HISTORY:  Past Surgical History:  Procedure Laterality Date  . ABDOMINAL HYSTERECTOMY    . ABDOMINOPLASTY    . APPENDECTOMY    . BACK  SURGERY     extensive spinal fusion  . carpel tunnel surgery    . CATARACT EXTRACTION     x 2  . CESAREAN SECTION    . CHOLECYSTECTOMY    . COLONOSCOPY  2010  . COLONOSCOPY WITH PROPOFOL N/A 07/11/2016   Procedure: COLONOSCOPY WITH PROPOFOL;  Surgeon: Lucilla Lame, MD;  Location: [facility] ENDOSCOPY;  Service: Endoscopy;  Laterality: N/A;  . EYE SURGERY    . HERNIA REPAIR    . SHOULDER SURGERY    . TOE SURGERY    . UPPER GI ENDOSCOPY    . WRIST FRACTURE SURGERY Left     SOCIAL HISTORY:  Social History   Tobacco Use  . Smoking status: Never Smoker  . Smokeless tobacco: Never Used  Substance Use Topics  . Alcohol use: No    FAMILY HISTORY:  Family History  Problem Relation Age of Onset  . Hyperlipidemia Mother   . Arrhythmia Mother        WPW  . Hypertension Mother   . Rheum arthritis Mother   . Heart disease Mother   . Heart attack Father 30  . Hyperlipidemia Father   . Hypertension Father   . Stroke Father   . Diabetes Father   . Heart disease Father   . Coronary artery disease Father   .  Diabetes Sister   . Hyperlipidemia Sister   . Hypertension Sister   . Depression Sister   . Depression Sister   . Diabetes Sister   . Hypertension Sister   . Hyperlipidemia Sister   . Kidney disease Paternal Grandmother     DRUG ALLERGIES:  Allergies  Allergen Reactions  . Morphine And Related Anaphylaxis  . Fentanyl Other (See Comments)    Unknown reaction.  . Reglan [Metoclopramide]     Other reaction(s): Unknown Elevated BP  . Simvastatin Other (See Comments)    Muscle pain  . Betadine [Povidone Iodine] Rash  . Iodine Rash    Other reaction(s): Unknown  . Tetracyclines & Related Rash    Other reaction(s): Unknown    REVIEW OF SYSTEMS:   CONSTITUTIONAL: No fever, fatigue or positive weakness.  EYES: No blurred or double vision.  EARS, NOSE, AND THROAT: No tinnitus or ear pain.  RESPIRATORY: No cough, shortness of breath, wheezing or hemoptysis.   CARDIOVASCULAR: No chest pain, orthopnea, edema.  GASTROINTESTINAL: No nausea, vomiting, diarrhea or abdominal pain.  GENITOURINARY: No dysuria, hematuria.  ENDOCRINE: No polyuria, nocturia,  HEMATOLOGY: No anemia, easy bruising or bleeding SKIN: Chronic eczema. MUSCULOSKELETAL: No joint pain or arthritis.   NEUROLOGIC: No tingling, numbness, left-sided weakness  pSYCHIATRY: No anxiety or depression.   MEDICATIONS AT HOME:  Prior to Admission medications   Medication Sig Start Date End Date Taking? Authorizing Provider  ACCU-CHEK AVIVA PLUS test strip TEST BLOOD SUGAR THREE TIMES DAILY 04/24/16  Yes Jerrol Banana., MD  ACCU-CHEK SOFTCLIX LANCETS lancets CHECK BLOOD SUGAR THREE TIMES DAILY 04/24/16  Yes Jerrol Banana., MD  amLODipine (NORVASC) 10 MG tablet Take 1 tablet (10 mg total) by mouth daily. 07/31/17  Yes Jerrol Banana., MD  aspirin 81 MG EC tablet Take 1 tablet (81 mg total) by mouth daily. Swallow whole. 11/27/16  Yes Dohmeier, Asencion Partridge, MD  atorvastatin (LIPITOR) 20 MG tablet Take 20 mg by mouth daily.   Yes [provider]  Blood Glucose Monitoring Suppl (ACCU-CHEK AVIVA PLUS) w/Device KIT Check sugar three times daily DX E11.9-needs a meter 11/23/15  Yes Jerrol Banana., MD  diphenoxylate-atropine (LOMOTIL) 2.5-0.025 MG tablet 2 tablets initially and then 1 tablet every 6 hours as needed for diarrhea 07/25/16  Yes Jerrol Banana., MD  insulin aspart protamine- aspart (NOVOLOG MIX 70/30) (70-30) 100 UNIT/ML injection Inject 10-35 Units into the skin 2 (two) times daily.    Yes [provider]  Lancet Devices [facility]) lancets Check sugar three times daily DX E11.9 11/23/15  Yes Jerrol Banana., MD  acyclovir ointment (ZOVIRAX) 5 % Apply 1 application topically every 3 (three) hours. Patient not taking: Reported on 10/03/2017 07/04/17   Dohmeier, Asencion Partridge, MD  atorvastatin (LIPITOR) 40 MG tablet Take 0.5 tablets (20 mg  total) by mouth at bedtime. Patient not taking: Reported on 10/03/2017 07/31/17   Jerrol Banana., MD  enalapril (VASOTEC) 20 MG tablet Take 1 tablet (20 mg total) by mouth 2 (two) times daily. 07/31/17   Jerrol Banana., MD  hydrALAZINE (APRESOLINE) 100 MG tablet Take 1 tablet (100 mg total) by mouth 2 (two) times daily. 09/06/17   Jerrol Banana., MD  HYDROcodone-acetaminophen [facility]) 10-325 MG tablet Take 1-2 tablets by mouth every 6 (six) hours as needed. 10/01/17   Jerrol Banana., MD  levETIRAcetam (KEPPRA) 500 MG tablet Take 1 tablet (500 mg total) by  mouth 2 (two) times daily. 500 mg bid. 07/31/17   Jerrol Banana., MD  levETIRAcetam (KEPPRA) 750 MG tablet TAKE ONE TABLET BY MOUTH TWO TIMES A DAY. 09/21/17   Jerrol Banana., MD  metFORMIN (GLUCOPHAGE) 1000 MG tablet Take 1 tablet (1,000 mg total) by mouth 2 (two) times daily. 07/31/17   Jerrol Banana., MD  metoprolol succinate (TOPROL-XL) 100 MG 24 hr tablet Take 1 tablet (100 mg total) by mouth daily. 07/31/17   Jerrol Banana., MD  nitroGLYCERIN (NITROSTAT) 0.4 MG SL tablet Place 1 tablet (0.4 mg total) under the tongue every 5 (five) minutes as needed for chest pain. 10/11/15   Jerrol Banana., MD  oxybutynin (DITROPAN) 5 MG tablet Take 1 tablet (5 mg total) by mouth 2 (two) times daily. 07/31/17   Jerrol Banana., MD  pantoprazole (PROTONIX) 40 MG tablet Take 1 tablet (40 mg total) by mouth daily. 07/31/17   Jerrol Banana., MD  triamcinolone cream (KENALOG) 0.1 % Can use thin layer on affected areas 2x daily for one week at a time. Not for face. Patient not taking: Reported on 10/03/2017 12/20/16   Trinna Post, PA-C      PHYSICAL EXAMINATION:   VITAL SIGNS: Weight 235 lb (106.6 kg).  GENERAL:  62 y.o.-year-old patient lying in the bed with no acute distress.  EYES: Pupils equal, round, reactive to light and accommodation. No scleral icterus. Extraocular muscles  intact.  HEENT: Head atraumatic, normocephalic. Oropharynx and nasopharynx clear.  NECK:  Supple, no jugular venous distention. No thyroid enlargement, no tenderness.  LUNGS: Normal breath sounds bilaterally, no wheezing, rales,rhonchi or crepitation. No use of accessory muscles of respiration.  CARDIOVASCULAR: S1, S2 normal. No murmurs, rubs, or gallops.  ABDOMEN: Soft, nontender, nondistended. Bowel sounds present. No organomegaly or mass.  EXTREMITIES: No pedal edema, cyanosis, or clubbing.  NEUROLOGIC: Cranial nerves II through XII are intact.  Muscle strength in the left upper extremity is 4 out of 5.  Lower extremity is 4 out of 5 right upper extremity right lower extremity is 5 out of 5 PSYCHIATRIC: The patient is alert and oriented x 3.  SKIN: Eczema on her face chest  LABORATORY PANEL:   CBC Recent Labs  Lab 10/03/17 1734  WBC 7.1  HGB 11.7*  HCT 35.1  PLT 213  MCV 86.5  MCH 28.9  MCHC 33.4  RDW 14.9*  LYMPHSABS 1.8  MONOABS 0.8  EOSABS 0.3  BASOSABS 0.0   ------------------------------------------------------------------------------------------------------------------  Chemistries  Recent Labs  Lab 10/03/17 1734  NA 138  K 3.4*  CL 105  CO2 25  GLUCOSE 94  BUN 23*  CREATININE 0.95  CALCIUM 8.4*  AST 27  ALT 23  ALKPHOS 68  BILITOT 0.5   ------------------------------------------------------------------------------------------------------------------ estimated creatinine clearance is 71.4 mL/min (by C-G formula based on SCr of 0.95 mg/dL). ------------------------------------------------------------------------------------------------------------------ No results for input(s): TSH, T4TOTAL, T3FREE, THYROIDAB in the last 72 hours.  Invalid input(s): FREET3   Coagulation profile Recent Labs  Lab 10/03/17 1734  INR 0.95   ------------------------------------------------------------------------------------------------------------------- No results  for input(s): DDIMER in the last 72 hours. -------------------------------------------------------------------------------------------------------------------  Cardiac Enzymes Recent Labs  Lab 10/03/17 1734  TROPONINI <0.03   ------------------------------------------------------------------------------------------------------------------ Invalid input(s): POCBNP  ---------------------------------------------------------------------------------------------------------------  Urinalysis    Component Value Date/Time   COLORURINE YELLOW (A) 07/24/2017 0942   APPEARANCEUR CLOUDY (A) 07/24/2017 0942   APPEARANCEUR Turbid (A) 05/25/2016 1122   LABSPEC 1.020 07/24/2017 2924  LABSPEC 1.014 01/17/2014 1708   PHURINE 5.0 07/24/2017 0942   GLUCOSEU NEGATIVE 07/24/2017 0942   GLUCOSEU >=500 01/17/2014 1708   HGBUR NEGATIVE 07/24/2017 0942   BILIRUBINUR Negative 10/01/2017 1618   BILIRUBINUR Negative 05/25/2016 1122   BILIRUBINUR Negative 01/17/2014 1708   KETONESUR NEGATIVE 07/24/2017 0942   PROTEINUR 300 10/01/2017 1618   PROTEINUR 100 (A) 07/24/2017 0942   UROBILINOGEN 0.2 10/01/2017 1618   UROBILINOGEN 0.2 01/15/2009 0431   NITRITE Negative 10/01/2017 1618   NITRITE NEGATIVE 07/24/2017 0942   LEUKOCYTESUR Negative 10/01/2017 1618   LEUKOCYTESUR Trace (A) 05/25/2016 1122   LEUKOCYTESUR 2+ 01/17/2014 1708     RADIOLOGY: Dg Thoracic Spine 2 View  Result Date: 10/03/2017 CLINICAL DATA:  Chronic dorsalgia EXAM: THORACIC SPINE 3 VIEWS COMPARISON:  Chest radiograph Nov 22, 2016 FINDINGS: Frontal, lateral, and swimmer's views were obtained. There is no fracture or spondylolisthesis. There is moderate disc space narrowing at multiple levels. No erosive change. There are right-sided lateral osteophytes at multiple levels. No paraspinous lesions. IMPRESSION: Mild osteoarthritic change at multiple levels. No fracture or spondylolisthesis. Electronically Signed   By: Lowella Grip III M.D.    On: 10/03/2017 14:19   Dg Lumbar Spine Complete  Result Date: 10/03/2017 CLINICAL DATA:  Pain following fall EXAM: LUMBAR SPINE - COMPLETE 4+ VIEW COMPARISON:  Lumbar spine radiographs May 16, 2016; lumbar MRI February 12, 2017 FINDINGS: Frontal, lateral, spot lumbosacral lateral, and bilateral oblique views were obtained. There are 5 non-rib-bearing lumbar type vertebral bodies. There is posterior screw and plate fixation at L4 and L5. pedicle screw tips are in the respective vertebral bodies. Disc spacer at L4-5. No acute fracture or spondylolisthesis. There is severe disc space narrowing at L2-3 and L4-5, stable. There is moderate disc space narrowing at all other levels. There is facet osteoarthritic change at all levels bilaterally. There is aortoiliac atherosclerosis. IMPRESSION: Postoperative change at L4 and L5. Extensive multilevel osteoarthritic change persists. No fracture or spondylolisthesis. There is aortoiliac atherosclerotic change. Aortic Atherosclerosis (ICD10-I70.0). Electronically Signed   By: Lowella Grip III M.D.   On: 10/03/2017 14:18   Ct Head Wo Contrast  Result Date: 10/03/2017 CLINICAL DATA:  Confusion, slurred speech and LEFT-sided weakness since 1000 hours, suspected stroke greater than 6 hours old EXAM: CT HEAD WITHOUT CONTRAST TECHNIQUE: Contiguous axial images were obtained from the base of the skull through the vertex without intravenous contrast. COMPARISON:  10/15/2016 FINDINGS: Brain: Generalized atrophy. Ex vacuo dilatation of the anterior LEFT lateral ventricle unchanged. No midline shift or mass effect. Small vessel chronic ischemic changes of deep cerebral white matter. Old infarcts involving the LEFT basal ganglia, LEFT caudate lobe and anterior limb of LEFT internal capsule with encephalomalacia. No intracranial hemorrhage, mass lesion or definite evidence of acute infarction. No extra-axial fluid collections. Vascular: Extensive atherosclerotic calcifications  of internal carotid and vertebral arteries at skull base Skull: Intact Sinuses/Orbits: Clear Other: N/A IMPRESSION: Atrophy with small vessel chronic ischemic changes of deep cerebral white matter. Encephalomalacia at sites of prior lacunar infarcts involving the LEFT basal ganglia, LEFT caudate and anterior limb LEFT internal capsule with ex vacuo dilatation of anterior LEFT lateral ventricle. No new intracranial abnormalities identified. Electronically Signed   By: Lavonia Dana M.D.   On: 10/03/2017 17:50    EKG: Orders placed or performed during the hospital encounter of 10/03/17  . ED EKG  . ED EKG    IMPRESSION AND PLAN: Patient is 62 year old white female presenting with strokelike symptoms  1.  Acute CVA Swallow screen Neurology consult MRI MRA of the brain Carotid Doppler Echo of the heart Continue aspirin PT evaluation  2.  Diabetes type 2 Place on sliding scale insulin Start on low-dose Lantus substitute for 7030 insulin  3.  Essential hypertension continue amlodipine and enalapril and hydralazine  4.  History of seizure disorder continue Keppra  5.  Essential hypertension continue Toprol  6.  GERD continue Protonix  7.  Miscellaneous Lovenox for DVT prophylaxis  All the records are reviewed and case discussed with ED provider. Management plans discussed with the patient, family and they are in agreement.  CODE STATUS: Code Status History    Date Active Date Inactive Code Status Order ID Comments User Context   10/15/2016 22:01 10/17/2016 16:24 Full Code [id number]  Vaughan Basta, MD ED   07/08/2016 04:37 07/11/2016 16:10 Full Code [id number]  Saundra Shelling, MD ED   12/26/2015 18:47 12/28/2015 20:27 Full Code [id number]  Idelle Crouch, MD Inpatient   07/22/2015 07:35 07/23/2015 15:05 Full Code [id number]  Saundra Shelling, MD Inpatient       TOTAL TIME TAKING CARE OF THIS PATIENT: 55 minutes.    Dustin Flock M.D on 10/03/2017 at 8:35 PM  Between 7am to  6pm - Pager - [phone]  After 6pm go to [email or web address] - password [email or web address] [facility]  [phone]  CC: Primary care physician; Jerrol Banana., MD

## 2017-10-03 NOTE — Progress Notes (Signed)
Anticoagulation monitoring(Lovenox):  62 yo female ordered Lovenox 40 mg Q24h  Filed Weights   10/03/17 1728  Weight: 235 lb (106.6 kg)   BMI 43   Lab Results  Component Value Date   CREATININE 0.95 10/03/2017   CREATININE 1.10 (H) 07/24/2017   CREATININE 1.25 (H) 11/22/2016   Estimated Creatinine Clearance: 71.4 mL/min (by C-G formula based on SCr of 0.95 mg/dL). Hemoglobin & Hematocrit     Component Value Date/Time   HGB 11.7 (L) 10/03/2017 1734   HGB 11.5 07/25/2016 1429   HCT 35.1 10/03/2017 1734   HCT 36.1 07/25/2016 1429     Per Protocol for Patient with estCrcl > 30 ml/min and BMI > 40, will transition to Lovenox 40 mg Q12h.

## 2017-10-03 NOTE — ED Triage Notes (Addendum)
Pt to ER via POV c/o confusion, left sided weakness, vision "snowy" since 10AM today. Pt feels that her speech is slurred. Hx of stroke with left sided arm contracture.  Left arm weaker than baseline as well as left leg weaker than baseline. Pt alert and oriented X 4 at this time, speech mildly slurred at this time of presentation.

## 2017-10-03 NOTE — Telephone Encounter (Signed)
Patient has appointment to see Fabio Bering today, Mickel Baas will give results to patient

## 2017-10-03 NOTE — Telephone Encounter (Signed)
Patient wanted to let provider know she went for x-ray today, she states that she is very uncomfortable and has had trouble laying down for the past 2 night due to fall. Patient states today she has been extremely unsteady and dizzy, patient denies nausea or vomiting she denies falling today. Patient would like to know if we got xray reports back. KW

## 2017-10-03 NOTE — Telephone Encounter (Signed)
-----   Message from Jerrol Banana., MD sent at 10/03/2017  2:33 PM EDT ----- No fractures.Alot of arthritic changes.

## 2017-10-03 NOTE — Telephone Encounter (Signed)
Unable to reach patient at this time home line is busy. KW

## 2017-10-03 NOTE — Patient Instructions (Signed)

## 2017-10-03 NOTE — ED Notes (Signed)
Patient transported to CT 

## 2017-10-03 NOTE — ED Notes (Signed)
FN: pt sent over by PCP with confusion, slurred speech and left side weakness started around 10am this morning.
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
## 2017-10-04 ENCOUNTER — Inpatient Hospital Stay: Payer: PPO

## 2017-10-04 ENCOUNTER — Inpatient Hospital Stay (HOSPITAL_COMMUNITY)
Admit: 2017-10-04 | Discharge: 2017-10-04 | Disposition: A | Discharge status: Home / Self Care | Payer: PPO | Attending: Internal Medicine | Admitting: Internal Medicine

## 2017-10-04 ENCOUNTER — Ambulatory Visit: Payer: Self-pay | Admitting: Family Medicine

## 2017-10-04 ENCOUNTER — Telehealth: Payer: Self-pay | Admitting: Family Medicine

## 2017-10-04 DIAGNOSIS — G459 Transient cerebral ischemic attack, unspecified: Secondary | ICD-10-CM

## 2017-10-04 DIAGNOSIS — I6523 Occlusion and stenosis of bilateral carotid arteries: Secondary | ICD-10-CM | POA: Diagnosis not present

## 2017-10-04 DIAGNOSIS — I639 Cerebral infarction, unspecified: Secondary | ICD-10-CM

## 2017-10-04 DIAGNOSIS — R569 Unspecified convulsions: Secondary | ICD-10-CM | POA: Diagnosis not present

## 2017-10-04 DIAGNOSIS — I5030 Unspecified diastolic (congestive) heart failure: Secondary | ICD-10-CM | POA: Diagnosis not present

## 2017-10-04 DIAGNOSIS — E118 Type 2 diabetes mellitus with unspecified complications: Secondary | ICD-10-CM | POA: Diagnosis not present

## 2017-10-04 DIAGNOSIS — R531 Weakness: Secondary | ICD-10-CM | POA: Diagnosis not present

## 2017-10-04 DIAGNOSIS — I10 Essential (primary) hypertension: Secondary | ICD-10-CM | POA: Diagnosis not present

## 2017-10-04 LAB — LIPID PANEL
CHOLESTEROL: 97 mg/dL (ref 0–200)
HDL: 48 mg/dL (ref >40)
LDL Cholesterol: 39 mg/dL (ref 0–99)
Total CHOL/HDL Ratio: 2 RATIO
Triglycerides: 51 mg/dL (ref <150)
VLDL: 10 mg/dL (ref 0–40)

## 2017-10-04 LAB — GLUCOSE, CAPILLARY
GLUCOSE-CAPILLARY: 48 mg/dL — AB (ref 65–99)
GLUCOSE-CAPILLARY: 48 mg/dL — AB (ref 65–99)
GLUCOSE-CAPILLARY: 155 mg/dL — AB (ref 65–99)
GLUCOSE-CAPILLARY: 167 mg/dL — AB (ref 65–99)
GLUCOSE-CAPILLARY: 192 mg/dL — AB (ref 65–99)
Glucose-Capillary: 119 mg/dL — ABNORMAL HIGH (ref 65–99)
Glucose-Capillary: 127 mg/dL — ABNORMAL HIGH (ref 65–99)
Glucose-Capillary: 158 mg/dL — ABNORMAL HIGH (ref 65–99)
Glucose-Capillary: 158 mg/dL — ABNORMAL HIGH (ref 65–99)

## 2017-10-04 LAB — HEMOGLOBIN A1C
HEMOGLOBIN A1C: 7.8 % — AB (ref 4.8–5.6)
MEAN PLASMA GLUCOSE: 177.16 mg/dL

## 2017-10-04 LAB — ECHOCARDIOGRAM COMPLETE
HEIGHTINCHES: 62 in
Weight: 3907.2 oz

## 2017-10-04 MED ORDER — PERFLUTREN LIPID MICROSPHERE
1.0000 mL | INTRAVENOUS | Status: AC | PRN
  Administered 2017-10-04: 3 mL via INTRAVENOUS
  Filled 2017-10-04: qty 10

## 2017-10-04 MED ORDER — DEXTROSE 50 % IV SOLN
25.0000 g | Freq: Once | INTRAVENOUS | Status: AC
  Administered 2017-10-04: 25 g via INTRAVENOUS

## 2017-10-04 MED ORDER — DEXTROSE 5 % IV SOLN
INTRAVENOUS | Status: DC
  Administered 2017-10-04: 02:00:00 via INTRAVENOUS

## 2017-10-04 MED ORDER — DEXTROSE 50 % IV SOLN
INTRAVENOUS | Status: AC
  Administered 2017-10-04: 02:00:00 50 mL via INTRAVENOUS
  Filled 2017-10-04: qty 50

## 2017-10-04 MED ORDER — CLOPIDOGREL BISULFATE 75 MG PO TABS: 75.0000 mg | ORAL_TABLET | Freq: Every day | ORAL | 0 refills | Status: DC

## 2017-10-04 MED ORDER — DEXTROSE 50 % IV SOLN
1.0000 | Freq: Once | INTRAVENOUS | Status: AC
  Administered 2017-10-04: 50 mL via INTRAVENOUS

## 2017-10-04 NOTE — Telephone Encounter (Signed)
Patient is being D/C from [facility] today for CVA.  She has HFU on 10/11/2017 at 10:20am.

## 2017-10-04 NOTE — Care Management Obs Status (Signed)
[facility] NOTIFICATION   Patient Details  Name: Susan House MRN: [id number] Date of Birth: August 01, 1955   Medicare Observation Status Notification Given:  Yes    Katrina Stack, RN 10/04/2017, 2:06 PM

## 2017-10-04 NOTE — Discharge Summary (Addendum)
[facility] NAME: Susan House    MR#:  [id number]  DATE OF BIRTH:  June 30, 1956  DATE OF ADMISSION:  10/03/2017 ADMITTING PHYSICIAN: Dustin Flock, MD  DATE OF DISCHARGE: No discharge date for patient encounter.  PRIMARY CARE PHYSICIAN: Jerrol Banana., MD    ADMISSION DIAGNOSIS:  Slurred speech [R47.81] Nausea [R11.0] Sinus bradycardia [R00.1] Difficulty walking [R26.2] Left-sided weakness [R53.1]  DISCHARGE DIAGNOSIS:  Active Problems:   CVA (cerebral vascular accident) ([place])   SECONDARY DIAGNOSIS:   Past Medical History:  Diagnosis Date  . Allergy   . Arthritis   . Cellulitis and abscess of face   . Depression   . Diabetes mellitus ([place])   . Edema   . GERD (gastroesophageal reflux disease)   . Hematuria   . History of echocardiogram    2008: normal, 2011: normal LVSF  . History of nuclear stress test    a. 12/2009: lexiscan - negative  . Hyperlipidemia   . Hypertension   . IBS (irritable bowel syndrome)   . Migraine   . Morbid obesity ([place])   . Nocturia   . Stroke ([place]) 2000  . TIA (transient ischemic attack) 2010  . Urgency of micturation   . Urinary frequency   . Urinary incontinence     HOSPITAL COURSE:  Patient is 62 year old white female presenting with strokelike symptoms  1.  Acute TIA Resolved Admitted on our CVA/TIA protocol, MRI/A negative, physical therapy did see patient while in house-no physical therapy needs identified, patient tolerated diet, no complications while in house, patient changed from aspirin to Plavix daily, statin therapy remained unchanged given noted myofascial pain with medication, neurology did see patient while in house, echocardiogram and carotid Dopplers pending at the time of discharge  2.  Diabetes type 2 Controlled on current regiment  3.  Essential hypertension Controlled on amlodipine and enalapril and hydralazine  4.  History of  seizure disorder Stable on Keppra  5.  Essential hypertension Stable on Toprol  6.  GERD Stable on Protonix  DISCHARGE CONDITIONS:  On the day of discharge patient is afebrile, hemodynamically stable, tolerating diet, back to her baseline, to follow-up with primary care provider and urology as directed, for more specific details please see chart  CONSULTS OBTAINED:  Treatment Team:  Alexis Goodell, MD  DRUG ALLERGIES:   Allergies  Allergen Reactions  . Morphine And Related Anaphylaxis  . Fentanyl Other (See Comments)    Unknown reaction.  . Reglan [Metoclopramide]     Other reaction(s): Unknown Elevated BP  . Simvastatin Other (See Comments)    Muscle pain  . Betadine [Povidone Iodine] Rash  . Iodine Rash    Other reaction(s): Unknown  . Tetracyclines & Related Rash    Other reaction(s): Unknown    DISCHARGE MEDICATIONS:   Allergies as of 10/04/2017      Reactions   Morphine And Related Anaphylaxis   Fentanyl Other (See Comments)   Unknown reaction.   Reglan [metoclopramide]    Other reaction(s): Unknown Elevated BP   Simvastatin Other (See Comments)   Muscle pain   Betadine [povidone Iodine] Rash   Iodine Rash   Other reaction(s): Unknown   Tetracyclines & Related Rash   Other reaction(s): Unknown      Medication List    STOP taking these medications   aspirin 81 MG EC tablet     TAKE these medications   ACCU-CHEK AVIVA PLUS test strip  Generic drug:  glucose blood TEST BLOOD SUGAR THREE TIMES DAILY   ACCU-CHEK AVIVA PLUS w/Device Kit Check sugar three times daily DX E11.9-needs a meter   accu-chek softclix lancets Check sugar three times daily DX E11.9   ACCU-CHEK SOFTCLIX LANCETS lancets CHECK BLOOD SUGAR THREE TIMES DAILY   acyclovir ointment 5 % Commonly known as:  ZOVIRAX Apply 1 application topically every 3 (three) hours.   amLODipine 10 MG tablet Commonly known as:  NORVASC Take 1 tablet (10 mg total) by mouth daily.    atorvastatin 20 MG tablet Commonly known as:  LIPITOR Take 20 mg by mouth daily.   clopidogrel 75 MG tablet Commonly known as:  PLAVIX Take 1 tablet (75 mg total) by mouth daily.   diphenoxylate-atropine 2.5-0.025 MG tablet Commonly known as:  LOMOTIL 2 tablets initially and then 1 tablet every 6 hours as needed for diarrhea   enalapril 20 MG tablet Commonly known as:  VASOTEC Take 1 tablet (20 mg total) by mouth 2 (two) times daily.   hydrALAZINE 100 MG tablet Commonly known as:  APRESOLINE Take 1 tablet (100 mg total) by mouth 2 (two) times daily.   HYDROcodone-acetaminophen 10-325 MG tablet Commonly known as:  NORCO Take 1-2 tablets by mouth every 6 (six) hours as needed.   insulin aspart protamine- aspart (70-30) 100 UNIT/ML injection Commonly known as:  NOVOLOG MIX 70/30 Inject 10-35 Units into the skin 2 (two) times daily.   levETIRAcetam 750 MG tablet Commonly known as:  KEPPRA Take 750 mg by mouth 2 (two) times daily.   levETIRAcetam 500 MG tablet Commonly known as:  KEPPRA Take 1 tablet (500 mg total) by mouth 2 (two) times daily. 500 mg bid.   metFORMIN 1000 MG tablet Commonly known as:  GLUCOPHAGE Take 1 tablet (1,000 mg total) by mouth 2 (two) times daily.   metoprolol succinate 100 MG 24 hr tablet Commonly known as:  TOPROL-XL Take 1 tablet (100 mg total) by mouth daily.   nitroGLYCERIN 0.4 MG SL tablet Commonly known as:  NITROSTAT Place 1 tablet (0.4 mg total) under the tongue every 5 (five) minutes as needed for chest pain.   oxybutynin 5 MG tablet Commonly known as:  DITROPAN Take 1 tablet (5 mg total) by mouth 2 (two) times daily.   pantoprazole 40 MG tablet Commonly known as:  PROTONIX Take 1 tablet (40 mg total) by mouth daily.   triamcinolone cream 0.1 % Commonly known as:  KENALOG Can use thin layer on affected areas 2x daily for one week at a time. Not for face.        DISCHARGE INSTRUCTIONS:    If you experience worsening of  your admission symptoms, develop shortness of breath, life threatening emergency, suicidal or homicidal thoughts you must seek medical attention immediately by calling 911 or calling your MD immediately  if symptoms less severe.  You Must read complete instructions/literature along with all the possible adverse reactions/side effects for all the Medicines you take and that have been prescribed to you. Take any new Medicines after you have completely understood and accept all the possible adverse reactions/side effects.   Please note  You were cared for by a hospitalist during your hospital stay. If you have any questions about your discharge medications or the care you received while you were in the hospital after you are discharged, you can call the unit and asked to speak with the hospitalist on call if the hospitalist that took care of you is  not available. Once you are discharged, your primary care physician will handle any further medical issues. Please note that NO REFILLS for any discharge medications will be authorized once you are discharged, as it is imperative that you return to your primary care physician (or establish a relationship with a primary care physician if you do not have one) for your aftercare needs so that they can reassess your need for medications and monitor your lab values.    Today   CHIEF COMPLAINT:   Chief Complaint  Patient presents with  . Weakness  . Altered Mental Status    HISTORY OF PRESENT ILLNESS:  62 y.o. female with a known history of previous history of stroke with chronic left-sided weakness who is presenting with complaint of worsening weakness on the left side since this morning at 10 AM.  Patient reports that she had a stroke about 19 years ago.  Which initially left her with significant weakness in the left upper and left lower extremity requiring rehab.  Subsequently her strength improved.  However this morning patient started to have having  worsening weakness of the left upper extremity and left lower extremity.  She also started having slurred speech.  Patient came to the emergency room went to see symptoms and CT scan of the head was done which was negative.  VITAL SIGNS:  Blood pressure (!) 128/42, pulse 68, temperature 98 F (36.7 C), temperature source Oral, resp. rate 18, height 5' 2"  (1.575 m), weight 110.8 kg (244 lb 3.2 oz), SpO2 95 %.  I/O:    Intake/Output Summary (Last 24 hours) at 10/04/2017 1253 Last data filed at 10/04/2017 0400 Gross per 24 hour  Intake 140 ml  Output 0 ml  Net 140 ml    PHYSICAL EXAMINATION:  GENERAL:  62 y.o.-year-old patient lying in the bed with no acute distress.  EYES: Pupils equal, round, reactive to light and accommodation. No scleral icterus. Extraocular muscles intact.  HEENT: Head atraumatic, normocephalic. Oropharynx and nasopharynx clear.  NECK:  Supple, no jugular venous distention. No thyroid enlargement, no tenderness.  LUNGS: Normal breath sounds bilaterally, no wheezing, rales,rhonchi or crepitation. No use of accessory muscles of respiration.  CARDIOVASCULAR: S1, S2 normal. No murmurs, rubs, or gallops.  ABDOMEN: Soft, non-tender, non-distended. Bowel sounds present. No organomegaly or mass.  EXTREMITIES: No pedal edema, cyanosis, or clubbing.  NEUROLOGIC: Cranial nerves II through XII are intact. Muscle strength 5/5 in all extremities. Sensation intact. Gait not checked.  PSYCHIATRIC: The patient is alert and oriented x 3.  SKIN: No obvious rash, lesion, or ulcer.   DATA REVIEW:   CBC Recent Labs  Lab 10/03/17 1734  WBC 7.1  HGB 11.7*  HCT 35.1  PLT 213    Chemistries  Recent Labs  Lab 10/03/17 1734  NA 138  K 3.4*  CL 105  CO2 25  GLUCOSE 94  BUN 23*  CREATININE 0.95  CALCIUM 8.4*  AST 27  ALT 23  ALKPHOS 68  BILITOT 0.5    Cardiac Enzymes Recent Labs  Lab 10/03/17 1734  TROPONINI <0.03    Microbiology Results  Results for orders  placed or performed during the hospital encounter of 07/24/17  Urine culture     Status: Abnormal   Collection Time: 07/24/17 10:04 AM  Result Value Ref Range Status   Specimen Description   Final    URINE, RANDOM Performed at [facility], [address] [place], [place] [address]    Special Requests  Final    NONE Performed at [facility], [address] [place], [place] [address]    Culture MULTIPLE SPECIES PRESENT, SUGGEST RECOLLECTION (A)  Final   Report Status 07/25/2017 FINAL  Final    RADIOLOGY:  Dg Thoracic Spine 2 View  Result Date: 10/03/2017 CLINICAL DATA:  Chronic dorsalgia EXAM: THORACIC SPINE 3 VIEWS COMPARISON:  Chest radiograph Nov 22, 2016 FINDINGS: Frontal, lateral, and swimmer's views were obtained. There is no fracture or spondylolisthesis. There is moderate disc space narrowing at multiple levels. No erosive change. There are right-sided lateral osteophytes at multiple levels. No paraspinous lesions. IMPRESSION: Mild osteoarthritic change at multiple levels. No fracture or spondylolisthesis. Electronically Signed   By: Lowella Grip III M.D.   On: 10/03/2017 14:19   Dg Lumbar Spine Complete  Result Date: 10/03/2017 CLINICAL DATA:  Pain following fall EXAM: LUMBAR SPINE - COMPLETE 4+ VIEW COMPARISON:  Lumbar spine radiographs May 16, 2016; lumbar MRI February 12, 2017 FINDINGS: Frontal, lateral, spot lumbosacral lateral, and bilateral oblique views were obtained. There are 5 non-rib-bearing lumbar type vertebral bodies. There is posterior screw and plate fixation at L4 and L5. pedicle screw tips are in the respective vertebral bodies. Disc spacer at L4-5. No acute fracture or spondylolisthesis. There is severe disc space narrowing at L2-3 and L4-5, stable. There is moderate disc space narrowing at all other levels. There is facet osteoarthritic change at all levels bilaterally. There is aortoiliac atherosclerosis. IMPRESSION: Postoperative change  at L4 and L5. Extensive multilevel osteoarthritic change persists. No fracture or spondylolisthesis. There is aortoiliac atherosclerotic change. Aortic Atherosclerosis (ICD10-I70.0). Electronically Signed   By: Lowella Grip III M.D.   On: 10/03/2017 14:18   Ct Head Wo Contrast  Result Date: 10/03/2017 CLINICAL DATA:  Confusion, slurred speech and LEFT-sided weakness since 1000 hours, suspected stroke greater than 6 hours old EXAM: CT HEAD WITHOUT CONTRAST TECHNIQUE: Contiguous axial images were obtained from the base of the skull through the vertex without intravenous contrast. COMPARISON:  10/15/2016 FINDINGS: Brain: Generalized atrophy. Ex vacuo dilatation of the anterior LEFT lateral ventricle unchanged. No midline shift or mass effect. Small vessel chronic ischemic changes of deep cerebral white matter. Old infarcts involving the LEFT basal ganglia, LEFT caudate lobe and anterior limb of LEFT internal capsule with encephalomalacia. No intracranial hemorrhage, mass lesion or definite evidence of acute infarction. No extra-axial fluid collections. Vascular: Extensive atherosclerotic calcifications of internal carotid and vertebral arteries at skull base Skull: Intact Sinuses/Orbits: Clear Other: N/A IMPRESSION: Atrophy with small vessel chronic ischemic changes of deep cerebral white matter. Encephalomalacia at sites of prior lacunar infarcts involving the LEFT basal ganglia, LEFT caudate and anterior limb LEFT internal capsule with ex vacuo dilatation of anterior LEFT lateral ventricle. No new intracranial abnormalities identified. Electronically Signed   By: Lavonia Dana M.D.   On: 10/03/2017 17:50   Mr Brain Wo Contrast  Result Date: 10/04/2017 CLINICAL DATA:  62 year old female with history of prior stroke with chronic left-sided weakness, presenting with worsening left-sided weakness. EXAM: MRI HEAD WITHOUT CONTRAST MRA HEAD WITHOUT CONTRAST TECHNIQUE: Multiplanar, multiecho pulse sequences of the  brain and surrounding structures were obtained without intravenous contrast. Angiographic images of the head were obtained using MRA technique without contrast. COMPARISON:  Prior CT from 10/03/2017. FINDINGS: MRI HEAD FINDINGS Brain: Generalized age-related cerebral atrophy. Remote lacunar infarct involving the left caudate, internal capsule, and lentiform nuclei again seen. Associated encephalomalacia with ex vacuo dilatation of the left lateral ventricle. Small amount of chronic  blood products within this region. Mild chronic microvascular ischemic changes present within the periventricular white matter. No abnormal foci of restricted diffusion to suggest acute or subacute ischemia. Gray-white matter differentiation otherwise maintained. No other areas of remote cortical infarction. No acute intracranial hemorrhage. No mass lesion, midline shift or mass effect. Ventricular legally related to prior infarct without hydrocephalus. No extra-axial fluid collection. Major dural sinuses are grossly patent. Pituitary gland suprasellar region within normal limits. Midline structures intact and normal. Vascular: Major intracranial vascular flow voids are maintained. Skull and upper cervical spine: Craniocervical junction normal. Upper cervical spine normal. Bone marrow signal intensity within normal limits. No scalp soft tissue abnormality. Sinuses/Orbits: Globes normal soft tissues within normal limits. Patient status post cataract extraction bilaterally. Paranasal sinuses are largely clear. No mastoid effusion. Inner ear structures normal. Other: None. MRA HEAD FINDINGS ANTERIOR CIRCULATION: Examination mildly degraded by motion artifact. Distal cervical segments of the internal carotid arteries are widely patent with antegrade flow. Petrous segments widely patent. Mild atheromatous irregularity within the cavernous/supraclinoid ICAs without flow-limiting stenosis. Origin of the ophthalmic arteries normal. ICA termini  widely patent. A1 segments patent bilaterally. Normal and patent anterior communicating artery. Anterior cerebral arteries patent to their distal aspects without stenosis. No M1 stenosis or occlusion. No proximal M2 occlusion. Distal MCA branches well perfused and symmetric, but demonstrate distal small vessel atheromatous irregularity. POSTERIOR CIRCULATION: Visualized distal V4 segments widely patent. Posterior inferior cerebral arteries patent proximally. Basilar artery widely patent to its distal aspect. Superior cerebral arteries patent bilaterally. Both of the posterior cerebral arteries primarily supplied via the basilar. PCAs demonstrate atheromatous irregularity without high-grade stenosis. Left PCA patent to its distal aspect. Distal right PCA branches attenuated. No aneurysm. IMPRESSION: MRI HEAD IMPRESSION: 1. No acute intracranial infarct or other abnormality identified. 2. Chronic left basal ganglia lacunar infarct with associated ex vacuo dilatation of the left lateral ventricle. 3. Mild chronic small vessel ischemic disease. MRA HEAD IMPRESSION: 1. Negative intracranial MRA with no evidence for large vessel occlusion. No high-grade or proximal correctable stenosis. 2. Mild distal small vessel atheromatous irregularity. Electronically Signed   By: Jeannine Boga M.D.   On: 10/04/2017 03:51   US Carotid Bilateral (at [facility] Only)  Result Date: 10/04/2017 CLINICAL DATA:  CVA EXAM: BILATERAL CAROTID DUPLEX ULTRASOUND TECHNIQUE: Pearline Cables scale imaging, color Doppler and duplex ultrasound were performed of bilateral carotid and vertebral arteries in the neck. COMPARISON:  07/08/2016 FINDINGS: Criteria: Quantification of carotid stenosis is based on velocity parameters that correlate the residual internal carotid diameter with NASCET-based stenosis levels, using the diameter of the distal internal carotid lumen as the denominator for stenosis measurement. The following velocity measurements were  obtained: RIGHT ICA:  193 cm/sec CCA:  52 cm/sec SYSTOLIC ICA/CCA RATIO:  3.7 DIASTOLIC ICA/CCA RATIO:  2.3 ECA:  194 cm/sec LEFT ICA:  73 cm/sec CCA:  61 cm/sec SYSTOLIC ICA/CCA RATIO:  1.2 DIASTOLIC ICA/CCA RATIO:  2.1 ECA:  115 cm/sec RIGHT CAROTID ARTERY: Advanced calcified plaque in the right bulb. Low resistance internal carotid Doppler pattern is preserved. RIGHT VERTEBRAL ARTERY:  Antegrade. LEFT CAROTID ARTERY: There is moderate calcified plaque in the upper common carotid. There is prominent calcified plaque in the bulb. Low resistance internal carotid Doppler pattern is preserved. LEFT VERTEBRAL ARTERY:  Antegrade. 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 Less than 50% stenosis in the left internal carotid artery. Electronically Signed   By: Marybelle Killings M.D.   On: 10/04/2017 12:15  Mr Jodene Nam Head/brain VO Cm  Result Date: 10/04/2017 CLINICAL DATA:  63 year old female with history of prior stroke with chronic left-sided weakness, presenting with worsening left-sided weakness. EXAM: MRI HEAD WITHOUT CONTRAST MRA HEAD WITHOUT CONTRAST TECHNIQUE: Multiplanar, multiecho pulse sequences of the brain and surrounding structures were obtained without intravenous contrast. Angiographic images of the head were obtained using MRA technique without contrast. COMPARISON:  Prior CT from 10/03/2017. FINDINGS: MRI HEAD FINDINGS Brain: Generalized age-related cerebral atrophy. Remote lacunar infarct involving the left caudate, internal capsule, and lentiform nuclei again seen. Associated encephalomalacia with ex vacuo dilatation of the left lateral ventricle. Small amount of chronic blood products within this region. Mild chronic microvascular ischemic changes present within the periventricular white matter. No abnormal foci of restricted diffusion to suggest acute or subacute ischemia. Gray-white matter differentiation otherwise maintained. No other areas of remote cortical infarction. No acute  intracranial hemorrhage. No mass lesion, midline shift or mass effect. Ventricular legally related to prior infarct without hydrocephalus. No extra-axial fluid collection. Major dural sinuses are grossly patent. Pituitary gland suprasellar region within normal limits. Midline structures intact and normal. Vascular: Major intracranial vascular flow voids are maintained. Skull and upper cervical spine: Craniocervical junction normal. Upper cervical spine normal. Bone marrow signal intensity within normal limits. No scalp soft tissue abnormality. Sinuses/Orbits: Globes normal soft tissues within normal limits. Patient status post cataract extraction bilaterally. Paranasal sinuses are largely clear. No mastoid effusion. Inner ear structures normal. Other: None. MRA HEAD FINDINGS ANTERIOR CIRCULATION: Examination mildly degraded by motion artifact. Distal cervical segments of the internal carotid arteries are widely patent with antegrade flow. Petrous segments widely patent. Mild atheromatous irregularity within the cavernous/supraclinoid ICAs without flow-limiting stenosis. Origin of the ophthalmic arteries normal. ICA termini widely patent. A1 segments patent bilaterally. Normal and patent anterior communicating artery. Anterior cerebral arteries patent to their distal aspects without stenosis. No M1 stenosis or occlusion. No proximal M2 occlusion. Distal MCA branches well perfused and symmetric, but demonstrate distal small vessel atheromatous irregularity. POSTERIOR CIRCULATION: Visualized distal V4 segments widely patent. Posterior inferior cerebral arteries patent proximally. Basilar artery widely patent to its distal aspect. Superior cerebral arteries patent bilaterally. Both of the posterior cerebral arteries primarily supplied via the basilar. PCAs demonstrate atheromatous irregularity without high-grade stenosis. Left PCA patent to its distal aspect. Distal right PCA branches attenuated. No aneurysm.  IMPRESSION: MRI HEAD IMPRESSION: 1. No acute intracranial infarct or other abnormality identified. 2. Chronic left basal ganglia lacunar infarct with associated ex vacuo dilatation of the left lateral ventricle. 3. Mild chronic small vessel ischemic disease. MRA HEAD IMPRESSION: 1. Negative intracranial MRA with no evidence for large vessel occlusion. No high-grade or proximal correctable stenosis. 2. Mild distal small vessel atheromatous irregularity. Electronically Signed   By: Jeannine Boga M.D.   On: 10/04/2017 03:51    EKG:   Orders placed or performed during the hospital encounter of 10/03/17  . ED EKG  . ED EKG      Management plans discussed with the patient, family and they are in agreement.  CODE STATUS:     Code Status Orders  (From admission, onward)        Start     Ordered   10/03/17 2209  Full code  Continuous     10/03/17 2208    Code Status History    Date Active Date Inactive Code Status Order ID Comments User Context   10/15/2016 2201 10/17/2016 1624 Full Code [id number]  Vaughan Basta, MD ED  07/08/2016 0437 07/11/2016 1610 Full Code [id number]  Saundra Shelling, MD ED   12/26/2015 1847 12/28/2015 2027 Full Code [id number]  Idelle Crouch, MD Inpatient   07/22/2015 0735 07/23/2015 1505 Full Code [id number]  Saundra Shelling, MD Inpatient      TOTAL TIME TAKING CARE OF THIS PATIENT: 35 minutes.    Avel Peace Salary M.D on 10/04/2017 at 12:53 PM  Between 7am to 6pm - Pager - [phone]  After 6pm go to [email or web address] - password [email or web address] [facility]  Office  [phone]  CC: Primary care physician; Jerrol Banana., MD   Note: This dictation was prepared with Dragon dictation along with smaller phrase technology. Any transcriptional errors that result from this process are unintentional.

## 2017-10-04 NOTE — Care Management CC44 (Signed)
Condition Code 44 Documentation Completed  Patient Details  Name: Susan House MRN: [id number] Date of Birth: 1955/12/15   Condition Code 44 given:  Yes Patient signature on Condition Code 44 notice:  Yes Documentation of 2 MD's agreement:  Yes Code 44 added to claim:  Yes    Katrina Stack, RN 10/04/2017, 2:06 PM

## 2017-10-04 NOTE — Significant Event (Signed)
Hypoglycemic Event  CBG: 48  Treatment: D50 IV 50 mL  Symptoms: None  Follow-up CBG: Time: 0234 CBG Result:127 Possible Reasons for Event: Unknown  Comments/MD notified:[facility]

## 2017-10-04 NOTE — Progress Notes (Signed)
*  PRELIMINARY RESULTS* Echocardiogram 2D Echocardiogram has been performed.  Susan House 10/04/2017, 11:24 AM

## 2017-10-04 NOTE — Progress Notes (Addendum)
Hypoglycemic Event  CBG: 31  Treatment: D50 IV 50 mL  Symptoms: drowsy and weak  Follow-up CBG: Time:0024 CBG Result :119  Possible Reasons for Event: Unknown  Comments/MD notified:Dr. Lacretia Leigh [facility]

## 2017-10-04 NOTE — Evaluation (Signed)
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  Clinical Impression  Pt did well with mobility, ambulation and generally is at her baseline.  She did not need assist with any activity and was independent/mod ind with all functional tasks.  Overall she did well, family present and agrees that she will be safe at home and will not need further PT intervention.     Follow Up Recommendations No PT follow up    Equipment Recommendations       Recommendations for Other Services       Precautions / Restrictions Precautions Precautions: Fall Restrictions Weight Bearing Restrictions: No      Mobility  Bed Mobility Overal bed mobility: Modified Independent             General bed mobility comments: Pt able to get to sitting EOB w/o assist  Transfers Overall transfer level: Modified independent Equipment used: Straight cane             General transfer comment: Pt able to rise and maintain balance w/o issue  Ambulation/Gait Ambulation/Gait assistance: Supervision Ambulation Distance (Feet): 150 Feet Assistive device: Straight cane       General Gait Details: Pt was able to walk without direct assist, no LOBs only minimal fatigue with SPC.  No overt safety issues, she reports she is at/near her baseline.  Stairs            Wheelchair Mobility    Modified Rankin (Stroke Patients Only)       Balance Overall balance assessment: Modified Independent                                           Pertinent Vitals/Pain Pain Assessment: No/denies pain    Home Living Family/patient expects to be discharged to:: Private residence Living Arrangements: Spouse/significant other Available Help at  Discharge: Family Type of Home: House Home Access: Ramped entrance       Home Equipment: Transport chair;Cane - single point;Walker - 2 wheels      Prior Function Level of Independence: Independent with assistive device(s)               Hand Dominance   Dominant Hand: Right    Extremity/Trunk Assessment   Upper Extremity Assessment Upper Extremity Assessment: (chronic significant L sided weakness, R weak but WFL)    Lower Extremity Assessment Lower Extremity Assessment: Generalized weakness;Overall WFL for tasks assessed(L side not nearly as functionally limited as UE)       Communication   Communication: No difficulties  Cognition Arousal/Alertness: Awake/alert Behavior During Therapy: WFL for tasks assessed/performed Overall Cognitive Status: Within Functional Limits for tasks assessed                                        General Comments      Exercises     Assessment/Plan    PT Assessment Patent does not need any further PT services  PT Problem List         PT Treatment Interventions      PT Goals (  Current goals can be found in the Care Plan section)  Acute Rehab PT Goals Patient Stated Goal: go home PT Goal Formulation: All assessment and education complete, DC therapy    Frequency     Barriers to discharge        Co-evaluation               AM-PAC PT "6 Clicks" Daily Activity  Outcome Measure Difficulty turning over in bed (including adjusting bedclothes, sheets and blankets)?: None Difficulty moving from lying on back to sitting on the side of the bed? : A Little Difficulty sitting down on and standing up from a chair with arms (e.g., wheelchair, bedside commode, etc,.)?: None Help needed moving to and from a bed to chair (including a wheelchair)?: None Help needed walking in hospital room?: None Help needed climbing 3-5 steps with a railing? : A Little 6 Click Score: 22    End of Session Equipment Utilized  During Treatment: Gait belt Activity Tolerance: Patient tolerated treatment well Patient left: with chair alarm set;with call bell/phone within reach   PT Visit Diagnosis: Muscle weakness (generalized) (M62.81)    Time: 1610-9604 PT Time Calculation (min) (ACUTE ONLY): 15 min   Charges:   PT Evaluation $PT Eval Low Complexity: 1 Low         Kreg Shropshire, DPT 10/04/2017, 11:17 AM

## 2017-10-04 NOTE — Progress Notes (Signed)
Discharge instructions given and went over with patient at bedside. Prescription given and reviewed. Patient educated regarding new medication. Follow-up appointments reviewed. Patient stating that she has established neurologist in [place] and wishes to follow-up there vs [place]. Stroke booklet referenced during education for discharge planning and importance of follow-up. Patient discharged home with family via wheelchair by volunteer services. Madlyn Frankel, RN

## 2017-10-04 NOTE — Progress Notes (Signed)
SLP Cancellation Note  Patient Details Name: Lis Savitt MRN: [id number] DOB: 02-26-1956   Cancelled treatment:       Reason Eval/Treat Not Completed: SLP screened, no needs identified, will sign off(chart reviewed; consulted MD/NSG then met w/ pt). Pt denied any difficulty swallowing and is currently on a regular diet; tolerates swallowing pills w/ water per NSG. Pt conversed at conversational level and answered questions re: self w/out deficits noted; pt and family denied any speech-language deficits.  No further skilled ST services indicated as pt appears at her baseline. Pt agreed. NSG to reconsult if any change in status.     Orinda Kenner, MS, CCC-SLP Watson,Katherine 10/04/2017, 12:18 PM

## 2017-10-04 NOTE — Progress Notes (Signed)
OT Cancellation Note  Patient Details Name: Susan House MRN: [id number] DOB: 12/16/1955   Cancelled Treatment:    Reason Eval/Treat Not Completed: Patient at procedure or test/ unavailable. Order received, chart reviewed. Pt having echo performed upon initial attempt. Will re-attempt at later time as pt is available and medically appropriate.   Jeni Salles, MPH, MS, OTR/L ascom [phone] 10/04/17, 11:00 AM

## 2017-10-04 NOTE — Consult Note (Signed)
Referring Physician: Salary    Chief Complaint: Left sided weakness  HPI: Susan House is an 62 y.o. female with a history of a stroke about 19 years ago with resultant left hemiparesis who repots that on yesterday had acute worsening of her left sided weakness. Has had no recent illness or change in medications.  Records show patient was referred to the ED with confusion, slurred speech and worsening left sided weakness.  Overnight returned to baseline with her left sided weakness.   Initial NIHSS of 5.   At baseline patient uses a walker and cane.  Is incontinent.    Date last known well: Date: 10/03/2017 Time last known well: Time: 10:00 tPA Given: No: Outside time window  Past Medical History:  Diagnosis Date  . Allergy   . Arthritis   . Cellulitis and abscess of face   . Depression   . Diabetes mellitus ([place])   . Edema   . GERD (gastroesophageal reflux disease)   . Hematuria   . History of echocardiogram    2008: normal, 2011: normal LVSF  . History of nuclear stress test    a. 12/2009: lexiscan - negative  . Hyperlipidemia   . Hypertension   . IBS (irritable bowel syndrome)   . Migraine   . Morbid obesity ([place])   . Nocturia   . Stroke ([place]) 2000  . TIA (transient ischemic attack) 2010  . Urgency of micturation   . Urinary frequency   . Urinary incontinence     Past Surgical History:  Procedure Laterality Date  . ABDOMINAL HYSTERECTOMY    . ABDOMINOPLASTY    . APPENDECTOMY    . BACK SURGERY     extensive spinal fusion  . carpel tunnel surgery    . CATARACT EXTRACTION     x 2  . CESAREAN SECTION    . CHOLECYSTECTOMY    . COLONOSCOPY  2010  . COLONOSCOPY WITH PROPOFOL N/A 07/11/2016   Procedure: COLONOSCOPY WITH PROPOFOL;  Surgeon: Lucilla Lame, MD;  Location: [facility] ENDOSCOPY;  Service: Endoscopy;  Laterality: N/A;  . EYE SURGERY    . HERNIA REPAIR    . SHOULDER SURGERY    . TOE SURGERY    . UPPER GI ENDOSCOPY    . WRIST FRACTURE SURGERY Left      Family History  Problem Relation Age of Onset  . Hyperlipidemia Mother   . Arrhythmia Mother        WPW  . Hypertension Mother   . Rheum arthritis Mother   . Heart disease Mother   . Heart attack Father 75  . Hyperlipidemia Father   . Hypertension Father   . Stroke Father   . Diabetes Father   . Heart disease Father   . Coronary artery disease Father   . Diabetes Sister   . Hyperlipidemia Sister   . Hypertension Sister   . Depression Sister   . Depression Sister   . Diabetes Sister   . Hypertension Sister   . Hyperlipidemia Sister   . Kidney disease Paternal Grandmother    Social History:  reports that she has never smoked. She has never used smokeless tobacco. She reports that she does not drink alcohol or use drugs.  Allergies:  Allergies  Allergen Reactions  . Morphine And Related Anaphylaxis  . Fentanyl Other (See Comments)    Unknown reaction.  . Reglan [Metoclopramide]     Other reaction(s): Unknown Elevated BP  . Simvastatin Other (See Comments)  Muscle pain  . Betadine [Povidone Iodine] Rash  . Iodine Rash    Other reaction(s): Unknown  . Tetracyclines & Related Rash    Other reaction(s): Unknown    Medications:  I have reviewed the patient's current medications. Prior to Admission:  Medications Prior to Admission  Medication Sig Dispense Refill Last Dose  . ACCU-CHEK AVIVA PLUS test strip TEST BLOOD SUGAR THREE TIMES DAILY 300 each 3 UTD at UTD  . ACCU-CHEK SOFTCLIX LANCETS lancets CHECK BLOOD SUGAR THREE TIMES DAILY 300 each 3 UTD at UTD  . amLODipine (NORVASC) 10 MG tablet Take 1 tablet (10 mg total) by mouth daily. 90 tablet 3 10/03/2017 at AM  . aspirin 81 MG EC tablet Take 1 tablet (81 mg total) by mouth daily. Swallow whole. 30 tablet 12 10/03/2017 at AM  . atorvastatin (LIPITOR) 20 MG tablet Take 20 mg by mouth daily.   10/02/2017 at PM  . Blood Glucose Monitoring Suppl (ACCU-CHEK AVIVA PLUS) w/Device KIT Check sugar three times daily DX  E11.9-needs a meter 1 kit 0 UTD at UTD  . diphenoxylate-atropine (LOMOTIL) 2.5-0.025 MG tablet 2 tablets initially and then 1 tablet every 6 hours as needed for diarrhea 60 tablet 1 PRN at PRN  . enalapril (VASOTEC) 20 MG tablet Take 1 tablet (20 mg total) by mouth 2 (two) times daily. 180 tablet 3 10/03/2017 at AM  . hydrALAZINE (APRESOLINE) 100 MG tablet Take 1 tablet (100 mg total) by mouth 2 (two) times daily. 60 tablet 3 10/03/2017 at AM  . HYDROcodone-acetaminophen (NORCO) 10-325 MG tablet Take 1-2 tablets by mouth every 6 (six) hours as needed. 60 tablet 0 PRN at PRN  . insulin aspart protamine- aspart (NOVOLOG MIX 70/30) (70-30) 100 UNIT/ML injection Inject 10-35 Units into the skin 2 (two) times daily.    10/02/2017 at PM  . Lancet Devices (ACCU-CHEK SOFTCLIX) lancets Check sugar three times daily DX E11.9 100 each 3 UTD at UTD  . levETIRAcetam (KEPPRA) 750 MG tablet Take 750 mg by mouth 2 (two) times daily.   10/03/2017 at AM  . metFORMIN (GLUCOPHAGE) 1000 MG tablet Take 1 tablet (1,000 mg total) by mouth 2 (two) times daily. 180 tablet 3 10/03/2017 at AM  . metoprolol succinate (TOPROL-XL) 100 MG 24 hr tablet Take 1 tablet (100 mg total) by mouth daily. 90 tablet 3 10/03/2017 at AM  . nitroGLYCERIN (NITROSTAT) 0.4 MG SL tablet Place 1 tablet (0.4 mg total) under the tongue every 5 (five) minutes as needed for chest pain. 25 tablet 0 PRN at PRN  . oxybutynin (DITROPAN) 5 MG tablet Take 1 tablet (5 mg total) by mouth 2 (two) times daily. 180 tablet 3 10/03/2017 at AM  . pantoprazole (PROTONIX) 40 MG tablet Take 1 tablet (40 mg total) by mouth daily. 90 tablet 3 10/03/2017 at AM  . acyclovir ointment (ZOVIRAX) 5 % Apply 1 application topically every 3 (three) hours. (Patient not taking: Reported on 10/03/2017) 5 g 1 Not Taking at --  . levETIRAcetam (KEPPRA) 500 MG tablet Take 1 tablet (500 mg total) by mouth 2 (two) times daily. 500 mg bid. (Patient not taking: Reported on 10/03/2017) 180 tablet 3 Not  Taking at --  . triamcinolone cream (KENALOG) 0.1 % Can use thin layer on affected areas 2x daily for one week at a time. Not for face. (Patient not taking: Reported on 10/03/2017) 30 g 0 Not Taking at --   Scheduled: . amLODipine  10 mg Oral Daily  . aspirin  EC  81 mg Oral Daily  . atorvastatin  20 mg Oral Daily  . enalapril  20 mg Oral BID  . enoxaparin (LOVENOX) injection  40 mg Subcutaneous Q12H  . hydrALAZINE  100 mg Oral BID  . insulin aspart  0-9 Units Subcutaneous TID WC  . levETIRAcetam  750 mg Oral BID  . metFORMIN  1,000 mg Oral BID WC  . metoprolol succinate  100 mg Oral Daily  . oxybutynin  5 mg Oral BID  . pantoprazole  40 mg Oral Daily    ROS: History obtained from the patient  General ROS: negative for - chills, fatigue, fever, night sweats, weight gain or weight loss Psychological ROS: as noted in HPI Ophthalmic ROS: negative for - blurry vision, double vision, eye pain or loss of vision ENT ROS: negative for - epistaxis, nasal discharge, oral lesions, sore throat, tinnitus or vertigo Allergy and Immunology ROS: negative for - hives or itchy/watery eyes Hematological and Lymphatic ROS: negative for - bleeding problems, bruising or swollen lymph nodes Endocrine ROS: negative for - galactorrhea, hair pattern changes, polydipsia/polyuria or temperature intolerance Respiratory ROS: negative for - cough, hemoptysis, shortness of breath or wheezing Cardiovascular ROS: negative for - chest pain, dyspnea on exertion, edema or irregular heartbeat Gastrointestinal ROS: negative for - abdominal pain, diarrhea, hematemesis, nausea/vomiting or stool incontinence Genito-Urinary ROS: negative for - dysuria, hematuria, incontinence or urinary frequency/urgency Musculoskeletal ROS: negative for - joint swelling or muscular weakness Neurological ROS: as noted in HPI Dermatological ROS: negative for rash and skin lesion changes  Physical Examination: Blood pressure (!) 167/47, pulse  (!) 58, temperature 97.9 F (36.6 C), temperature source Oral, resp. rate 18, height '5\' 2"'$  (1.575 m), weight 110.8 kg (244 lb 3.2 oz), SpO2 99 %.  HEENT-  Normocephalic, no lesions, without obvious abnormality.  Normal external eye and conjunctiva.  Normal TM's bilaterally.  Normal auditory canals and external ears. Normal external nose, mucus membranes and septum.  Normal pharynx. Cardiovascular- S1, S2 normal, pulses palpable throughout   Lungs- chest clear, no wheezing, rales, normal symmetric air entry Abdomen- soft, non-tender; bowel sounds normal; no masses,  no organomegaly Extremities- no edema Lymph-no adenopathy palpable Musculoskeletal-no joint tenderness, deformity or swelling Skin-warm and dry, no hyperpigmentation, vitiligo, or suspicious lesions  Neurological Examination   Mental Status: Alert, oriented, thought content appropriate.  Speech fluent without evidence of aphasia.  Able to follow 3 step commands without difficulty. Cranial Nerves: II: Discs flat bilaterally; Left HH, pupils equal, round, reactive to light and accommodation III,IV, VI: ptosis not present, extra-ocular motions intact bilaterally V,VII: smile symmetric, facial light touch sensation normal bilaterally VIII: hearing normal bilaterally IX,X: gag reflex present XI: bilateral shoulder shrug XII: midline tongue extension Motor: Right : Upper extremity   5/5    Left:     Upper extremity   4+/5  Lower extremity   5/5     Lower extremity   4/5 Increased tone on the left Sensory: Pinprick and light touch intact throughout, bilaterally Deep Tendon Reflexes: 2+ and symmetric throughout Plantars: Right: downgoing   Left: upgoing Cerebellar: Normal finger-to-nose and normal heel-to-shin testing on the right Gait: not tested due to safety concerns    Laboratory Studies:  Basic Metabolic Panel: Recent Labs  Lab 10/03/17 1734  NA 138  K 3.4*  CL 105  CO2 25  GLUCOSE 94  BUN 23*  CREATININE 0.95   CALCIUM 8.4*    Liver Function Tests: Recent Labs  Lab 10/03/17 1734  AST 27  ALT 23  ALKPHOS 68  BILITOT 0.5  PROT 6.7  ALBUMIN 3.6   No results for input(s): LIPASE, AMYLASE in the last 168 hours. No results for input(s): AMMONIA in the last 168 hours.  CBC: Recent Labs  Lab 10/03/17 1734  WBC 7.1  NEUTROABS 4.1  HGB 11.7*  HCT 35.1  MCV 86.5  PLT 213    Cardiac Enzymes: Recent Labs  Lab 10/03/17 1734  TROPONINI <0.03    BNP: Invalid input(s): POCBNP  CBG: Recent Labs  Lab 10/04/17 0208 10/04/17 0234 10/04/17 0405 10/04/17 0625 10/04/17 0739  GLUCAP 48* 127* 155* 158* 158*    Microbiology: Results for orders placed or performed during the hospital encounter of 07/24/17  Urine culture     Status: Abnormal   Collection Time: 07/24/17 10:04 AM  Result Value Ref Range Status   Specimen Description   Final    URINE, RANDOM Performed at [facility], [address] [place], [place] [address]    Special Requests   Final    NONE Performed at [facility], [address] [place], [place] [address]    Culture MULTIPLE SPECIES PRESENT, SUGGEST RECOLLECTION (A)  Final   Report Status 07/25/2017 FINAL  Final    Coagulation Studies: Recent Labs    10/03/17 1734  LABPROT 12.6  INR 0.95    Urinalysis:  Recent Labs  Lab 10/01/17 1618  BILIRUBINUR Negative  PROTEINUR 300  UROBILINOGEN 0.2  NITRITE Negative  LEUKOCYTESUR Negative    Lipid Panel:    Component Value Date/Time   CHOL 97 10/04/2017 0458   CHOL 110 01/18/2014 0059   TRIG 51 10/04/2017 0458   TRIG 122 01/18/2014 0059   HDL 48 10/04/2017 0458   HDL 35 (L) 01/18/2014 0059   CHOLHDL 2.0 10/04/2017 0458   VLDL 10 10/04/2017 0458   VLDL 24 01/18/2014 0059   LDLCALC 39 10/04/2017 0458   LDLCALC 51 01/18/2014 0059    HgbA1C:  Lab Results  Component Value Date   HGBA1C 7.8 (H) 10/04/2017    Urine Drug Screen:  No results found for: LABOPIA,  COCAINSCRNUR, LABBENZ, AMPHETMU, THCU, LABBARB  Alcohol Level: No results for input(s): ETH in the last 168 hours.  Other results: EKG: sinus bradycardia at 47 bpm.  Imaging: Dg Thoracic Spine 2 View  Result Date: 10/03/2017 CLINICAL DATA:  Chronic dorsalgia EXAM: THORACIC SPINE 3 VIEWS COMPARISON:  Chest radiograph Nov 22, 2016 FINDINGS: Frontal, lateral, and swimmer's views were obtained. There is no fracture or spondylolisthesis. There is moderate disc space narrowing at multiple levels. No erosive change. There are right-sided lateral osteophytes at multiple levels. No paraspinous lesions. IMPRESSION: Mild osteoarthritic change at multiple levels. No fracture or spondylolisthesis. Electronically Signed   By: Lowella Grip III M.D.   On: 10/03/2017 14:19   Dg Lumbar Spine Complete  Result Date: 10/03/2017 CLINICAL DATA:  Pain following fall EXAM: LUMBAR SPINE - COMPLETE 4+ VIEW COMPARISON:  Lumbar spine radiographs May 16, 2016; lumbar MRI February 12, 2017 FINDINGS: Frontal, lateral, spot lumbosacral lateral, and bilateral oblique views were obtained. There are 5 non-rib-bearing lumbar type vertebral bodies. There is posterior screw and plate fixation at L4 and L5. pedicle screw tips are in the respective vertebral bodies. Disc spacer at L4-5. No acute fracture or spondylolisthesis. There is severe disc space narrowing at L2-3 and L4-5, stable. There is moderate disc space narrowing at all other levels. There is facet osteoarthritic change at all levels  bilaterally. There is aortoiliac atherosclerosis. IMPRESSION: Postoperative change at L4 and L5. Extensive multilevel osteoarthritic change persists. No fracture or spondylolisthesis. There is aortoiliac atherosclerotic change. Aortic Atherosclerosis (ICD10-I70.0). Electronically Signed   By: Lowella Grip III M.D.   On: 10/03/2017 14:18   Ct Head Wo Contrast  Result Date: 10/03/2017 CLINICAL DATA:  Confusion, slurred speech and LEFT-sided  weakness since 1000 hours, suspected stroke greater than 6 hours old EXAM: CT HEAD WITHOUT CONTRAST TECHNIQUE: Contiguous axial images were obtained from the base of the skull through the vertex without intravenous contrast. COMPARISON:  10/15/2016 FINDINGS: Brain: Generalized atrophy. Ex vacuo dilatation of the anterior LEFT lateral ventricle unchanged. No midline shift or mass effect. Small vessel chronic ischemic changes of deep cerebral white matter. Old infarcts involving the LEFT basal ganglia, LEFT caudate lobe and anterior limb of LEFT internal capsule with encephalomalacia. No intracranial hemorrhage, mass lesion or definite evidence of acute infarction. No extra-axial fluid collections. Vascular: Extensive atherosclerotic calcifications of internal carotid and vertebral arteries at skull base Skull: Intact Sinuses/Orbits: Clear Other: N/A IMPRESSION: Atrophy with small vessel chronic ischemic changes of deep cerebral white matter. Encephalomalacia at sites of prior lacunar infarcts involving the LEFT basal ganglia, LEFT caudate and anterior limb LEFT internal capsule with ex vacuo dilatation of anterior LEFT lateral ventricle. No new intracranial abnormalities identified. Electronically Signed   By: Lavonia Dana M.D.   On: 10/03/2017 17:50   Mr Brain Wo Contrast  Result Date: 10/04/2017 CLINICAL DATA:  62 year old female with history of prior stroke with chronic left-sided weakness, presenting with worsening left-sided weakness. EXAM: MRI HEAD WITHOUT CONTRAST MRA HEAD WITHOUT CONTRAST TECHNIQUE: Multiplanar, multiecho pulse sequences of the brain and surrounding structures were obtained without intravenous contrast. Angiographic images of the head were obtained using MRA technique without contrast. COMPARISON:  Prior CT from 10/03/2017. FINDINGS: MRI HEAD FINDINGS Brain: Generalized age-related cerebral atrophy. Remote lacunar infarct involving the left caudate, internal capsule, and lentiform nuclei  again seen. Associated encephalomalacia with ex vacuo dilatation of the left lateral ventricle. Small amount of chronic blood products within this region. Mild chronic microvascular ischemic changes present within the periventricular white matter. No abnormal foci of restricted diffusion to suggest acute or subacute ischemia. Gray-white matter differentiation otherwise maintained. No other areas of remote cortical infarction. No acute intracranial hemorrhage. No mass lesion, midline shift or mass effect. Ventricular legally related to prior infarct without hydrocephalus. No extra-axial fluid collection. Major dural sinuses are grossly patent. Pituitary gland suprasellar region within normal limits. Midline structures intact and normal. Vascular: Major intracranial vascular flow voids are maintained. Skull and upper cervical spine: Craniocervical junction normal. Upper cervical spine normal. Bone marrow signal intensity within normal limits. No scalp soft tissue abnormality. Sinuses/Orbits: Globes normal soft tissues within normal limits. Patient status post cataract extraction bilaterally. Paranasal sinuses are largely clear. No mastoid effusion. Inner ear structures normal. Other: None. MRA HEAD FINDINGS ANTERIOR CIRCULATION: Examination mildly degraded by motion artifact. Distal cervical segments of the internal carotid arteries are widely patent with antegrade flow. Petrous segments widely patent. Mild atheromatous irregularity within the cavernous/supraclinoid ICAs without flow-limiting stenosis. Origin of the ophthalmic arteries normal. ICA termini widely patent. A1 segments patent bilaterally. Normal and patent anterior communicating artery. Anterior cerebral arteries patent to their distal aspects without stenosis. No M1 stenosis or occlusion. No proximal M2 occlusion. Distal MCA branches well perfused and symmetric, but demonstrate distal small vessel atheromatous irregularity. POSTERIOR CIRCULATION:  Visualized distal V4 segments widely patent. Posterior inferior  cerebral arteries patent proximally. Basilar artery widely patent to its distal aspect. Superior cerebral arteries patent bilaterally. Both of the posterior cerebral arteries primarily supplied via the basilar. PCAs demonstrate atheromatous irregularity without high-grade stenosis. Left PCA patent to its distal aspect. Distal right PCA branches attenuated. No aneurysm. IMPRESSION: MRI HEAD IMPRESSION: 1. No acute intracranial infarct or other abnormality identified. 2. Chronic left basal ganglia lacunar infarct with associated ex vacuo dilatation of the left lateral ventricle. 3. Mild chronic small vessel ischemic disease. MRA HEAD IMPRESSION: 1. Negative intracranial MRA with no evidence for large vessel occlusion. No high-grade or proximal correctable stenosis. 2. Mild distal small vessel atheromatous irregularity. Electronically Signed   By: Jeannine Boga M.D.   On: 10/04/2017 03:51   Mr Jodene Nam Head/brain YI Cm  Result Date: 10/04/2017 CLINICAL DATA:  62 year old female with history of prior stroke with chronic left-sided weakness, presenting with worsening left-sided weakness. EXAM: MRI HEAD WITHOUT CONTRAST MRA HEAD WITHOUT CONTRAST TECHNIQUE: Multiplanar, multiecho pulse sequences of the brain and surrounding structures were obtained without intravenous contrast. Angiographic images of the head were obtained using MRA technique without contrast. COMPARISON:  Prior CT from 10/03/2017. FINDINGS: MRI HEAD FINDINGS Brain: Generalized age-related cerebral atrophy. Remote lacunar infarct involving the left caudate, internal capsule, and lentiform nuclei again seen. Associated encephalomalacia with ex vacuo dilatation of the left lateral ventricle. Small amount of chronic blood products within this region. Mild chronic microvascular ischemic changes present within the periventricular white matter. No abnormal foci of restricted diffusion to  suggest acute or subacute ischemia. Gray-white matter differentiation otherwise maintained. No other areas of remote cortical infarction. No acute intracranial hemorrhage. No mass lesion, midline shift or mass effect. Ventricular legally related to prior infarct without hydrocephalus. No extra-axial fluid collection. Major dural sinuses are grossly patent. Pituitary gland suprasellar region within normal limits. Midline structures intact and normal. Vascular: Major intracranial vascular flow voids are maintained. Skull and upper cervical spine: Craniocervical junction normal. Upper cervical spine normal. Bone marrow signal intensity within normal limits. No scalp soft tissue abnormality. Sinuses/Orbits: Globes normal soft tissues within normal limits. Patient status post cataract extraction bilaterally. Paranasal sinuses are largely clear. No mastoid effusion. Inner ear structures normal. Other: None. MRA HEAD FINDINGS ANTERIOR CIRCULATION: Examination mildly degraded by motion artifact. Distal cervical segments of the internal carotid arteries are widely patent with antegrade flow. Petrous segments widely patent. Mild atheromatous irregularity within the cavernous/supraclinoid ICAs without flow-limiting stenosis. Origin of the ophthalmic arteries normal. ICA termini widely patent. A1 segments patent bilaterally. Normal and patent anterior communicating artery. Anterior cerebral arteries patent to their distal aspects without stenosis. No M1 stenosis or occlusion. No proximal M2 occlusion. Distal MCA branches well perfused and symmetric, but demonstrate distal small vessel atheromatous irregularity. POSTERIOR CIRCULATION: Visualized distal V4 segments widely patent. Posterior inferior cerebral arteries patent proximally. Basilar artery widely patent to its distal aspect. Superior cerebral arteries patent bilaterally. Both of the posterior cerebral arteries primarily supplied via the basilar. PCAs demonstrate  atheromatous irregularity without high-grade stenosis. Left PCA patent to its distal aspect. Distal right PCA branches attenuated. No aneurysm. IMPRESSION: MRI HEAD IMPRESSION: 1. No acute intracranial infarct or other abnormality identified. 2. Chronic left basal ganglia lacunar infarct with associated ex vacuo dilatation of the left lateral ventricle. 3. Mild chronic small vessel ischemic disease. MRA HEAD IMPRESSION: 1. Negative intracranial MRA with no evidence for large vessel occlusion. No high-grade or proximal correctable stenosis. 2. Mild distal small vessel atheromatous irregularity. Electronically Signed  By: Jeannine Boga M.D.   On: 10/04/2017 03:51    Assessment: 62 y.o. female with a history of stroke presenting with worsening left sided weakness, confusion and slurred speech.  Unclear etiology. Patient with a history of seizure as well.  MRI of the brain reviewed and shows no acute changes.  Patient on ASA prior to admission.  Carotid doppler shows 50-69% stenosis of RICA and <74% LICA.  Echocardiogram pending.   A1c 7.8, LDL 39.    Stroke Risk Factors - diabetes mellitus, hyperlipidemia and hypertension  Plan: 1. Blood sugar management with target A1c<7.0 2. PT consult, OT consult, Speech consult 3. Prophylactic therapy-Continue ASA,would increase to 351m daily 4. Telemetry monitoring 5. Frequent neuro checks 6. CTA of the head to follow up doppler findings.  If findings correlate with dopplers would have patient continue follow up on an outpatient basis 7. Unclear if this was TIA or seizure.  Patient with recent increase in [place]  Would continue at 7553mBID.      LeAlexis GoodellMD Neurology [id number]/21/2019, 11:11 AM

## 2017-10-04 NOTE — Evaluation (Signed)
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   Clinical Impression   Pt currently at baseline modified independence with mobility and ADL tasks. Pt endorses that L sided weakness is back to baseline weakness at this time. Pt reports only remaining deficit she is experiencing is blurry/"snowy" vision. With testing, able to determine R eye blurriness with pt reporting that the lower hemisphere of R eye is what's "most blurry". With visual fields testing, pt noted with decreased peripheral vision in both eyes. Visual deficits appear to not be causing any functional impairment. No further skilled OT services at this time. Will sign off. Please re-consult if additional needs arise.     Follow Up Recommendations  No OT follow up    Equipment Recommendations  None recommended by OT    Recommendations for Other Services       Precautions / Restrictions Precautions Precautions: Fall Restrictions Weight Bearing Restrictions: No      Mobility Bed Mobility Overal bed mobility: Modified Independent             General bed mobility comments: additional time/effort, use of bed rail but currently at baseline modified independence  Transfers Overall transfer level: Modified independent Equipment used: Straight cane             General transfer comment: Pt able to rise and maintain balance w/o issue    Balance Overall balance assessment: Modified Independent                                         ADL either performed or assessed with clinical judgement   ADL Overall ADL's : Modified independent;At baseline                                             Vision Baseline Vision/History: Wears  glasses Wears Glasses: At all times Patient Visual Report: Blurring of vision(R eye blurriness) Vision Assessment?: Yes Eye Alignment: Within Functional Limits Ocular Range of Motion: Within Functional Limits Alignment/Gaze Preference: Within Defined Limits Tracking/Visual Pursuits: Able to track stimulus in all quads without difficulty Visual Fields: Other (comment);Impaired-to be further tested in functional context(appears to have decreased peripheral vision bilaterally)     Perception     Praxis      Pertinent Vitals/Pain Pain Assessment: No/denies pain(reports chronic lumbar back pain, improved with repositioning in bed)     Hand Dominance Right   Extremity/Trunk Assessment Upper Extremity Assessment Upper Extremity Assessment: Overall WFL for tasks assessed;LUE deficits/detail(RUE WFL) LUE Deficits / Details: weakness t/o from previous stroke, pt notes weakness is at baseline currently   Lower Extremity Assessment Lower Extremity Assessment: Overall WFL for tasks assessed;Generalized weakness(LLE weaker than R, but is currently at baseline)   Cervical / Trunk Assessment Cervical / Trunk Assessment: Normal   Communication Communication Communication: No difficulties   Cognition Arousal/Alertness: Awake/alert Behavior During Therapy: WFL for tasks assessed/performed Overall Cognitive Status: Within Functional Limits for tasks assessed  General Comments       Exercises     Shoulder Instructions      Home Living Family/patient expects to be discharged to:: Private residence Living Arrangements: Spouse/significant other Available Help at Discharge: Family Type of Home: House Home Access: [place]: One level     Bathroom Shower/Tub: Occupational psychologist: Handicapped height     [place]: Transport chair;Cane - single point;Walker - 2 wheels          Prior  Functioning/Environment Level of Independence: Independent with assistive device(s)        Comments: Ambulatory for household distances w/ SPC, sup/mod indep; manual WC/power scooter for community distances; endorses 1 fall last Friday but no others in past 12 months        OT Problem List:        OT Treatment/Interventions:      OT Goals(Current goals can be found in the care plan section) Acute Rehab OT Goals Patient Stated Goal: go home OT Goal Formulation: All assessment and education complete, DC therapy  OT Frequency:     Barriers to D/C:            Co-evaluation              AM-PAC PT "6 Clicks" Daily Activity     Outcome Measure Help from another person eating meals?: None Help from another person taking care of personal grooming?: None Help from another person toileting, which includes using toliet, bedpan, or urinal?: None Help from another person bathing (including washing, rinsing, drying)?: None Help from another person to put on and taking off regular upper body clothing?: None Help from another person to put on and taking off regular lower body clothing?: None 6 Click Score: 24   End of Session    Activity Tolerance: Patient tolerated treatment well Patient left: in bed;with call bell/phone within reach;with family/visitor present  OT Visit Diagnosis: Other symptoms and signs involving cognitive function                Time: 1121-1140 OT Time Calculation (min): 19 min Charges:  OT General Charges $OT Visit: 1 Visit OT Evaluation $OT Eval Low Complexity: 1 Low  Jeni Salles, MPH, MS, OTR/L ascom [phone] 10/04/17, 11:59 AM

## 2017-10-08 ENCOUNTER — Ambulatory Visit (INDEPENDENT_AMBULATORY_CARE_PROVIDER_SITE_OTHER): Payer: PPO | Admitting: Family Medicine

## 2017-10-08 ENCOUNTER — Encounter: Payer: Self-pay | Admitting: Family Medicine

## 2017-10-08 ENCOUNTER — Telehealth: Payer: Self-pay

## 2017-10-08 VITALS — BP 160/68 | HR 72 | Temp 97.7°F | Resp 16

## 2017-10-08 DIAGNOSIS — I6389 Other cerebral infarction: Secondary | ICD-10-CM | POA: Diagnosis not present

## 2017-10-08 DIAGNOSIS — I69352 Hemiplegia and hemiparesis following cerebral infarction affecting left dominant side: Secondary | ICD-10-CM | POA: Diagnosis not present

## 2017-10-08 DIAGNOSIS — R6889 Other general symptoms and signs: Secondary | ICD-10-CM

## 2017-10-08 LAB — POCT INFLUENZA A/B
INFLUENZA B, POC: NEGATIVE
Influenza A, POC: NEGATIVE

## 2017-10-08 NOTE — Progress Notes (Signed)
Patient: Susan House Female    DOB: [birth year]   62 y.o.   MRN: [id number] Visit Date: 10/08/2017  Today's Provider: Wilhemena Durie, MD   Chief Complaint  Patient presents with  . URI   Subjective:    HPI Pt is here for URI symptoms that started yesterday. She reports that she has some symptoms that started Saturday but really started to feel worse yesterday. She has cough, congestion, sneezing, nasal congestion, headache, fever blisters on her mouth and rash on her face, right ear pain and chills. Denies fever, sore throat, sinus pain/pressure, productive cough.     Allergies  Allergen Reactions  . Morphine And Related Anaphylaxis  . Fentanyl Other (See Comments)    Unknown reaction.  . Reglan [Metoclopramide]     Other reaction(s): Unknown Elevated BP  . Simvastatin Other (See Comments)    Muscle pain  . Betadine [Povidone Iodine] Rash  . Iodine Rash    Other reaction(s): Unknown  . Tetracyclines & Related Rash    Other reaction(s): Unknown     Current Outpatient Medications:  .  ACCU-CHEK AVIVA PLUS test strip, TEST BLOOD SUGAR THREE TIMES DAILY, Disp: 300 each, Rfl: 3 .  ACCU-CHEK SOFTCLIX LANCETS lancets, CHECK BLOOD SUGAR THREE TIMES DAILY, Disp: 300 each, Rfl: 3 .  acyclovir ointment (ZOVIRAX) 5 %, Apply 1 application topically every 3 (three) hours., Disp: 5 g, Rfl: 1 .  amLODipine (NORVASC) 10 MG tablet, Take 1 tablet (10 mg total) by mouth daily., Disp: 90 tablet, Rfl: 3 .  atorvastatin (LIPITOR) 20 MG tablet, Take 20 mg by mouth daily., Disp: , Rfl:  .  Blood Glucose Monitoring Suppl (ACCU-CHEK AVIVA PLUS) w/Device KIT, Check sugar three times daily DX E11.9-needs a meter, Disp: 1 kit, Rfl: 0 .  clopidogrel (PLAVIX) 75 MG tablet, Take 1 tablet (75 mg total) by mouth daily., Disp: 90 tablet, Rfl: 0 .  diphenoxylate-atropine (LOMOTIL) 2.5-0.025 MG tablet, 2 tablets initially and then 1 tablet every 6 hours as needed for diarrhea, Disp: 60  tablet, Rfl: 1 .  enalapril (VASOTEC) 20 MG tablet, Take 1 tablet (20 mg total) by mouth 2 (two) times daily., Disp: 180 tablet, Rfl: 3 .  hydrALAZINE (APRESOLINE) 100 MG tablet, Take 1 tablet (100 mg total) by mouth 2 (two) times daily., Disp: 60 tablet, Rfl: 3 .  HYDROcodone-acetaminophen (NORCO) 10-325 MG tablet, Take 1-2 tablets by mouth every 6 (six) hours as needed., Disp: 60 tablet, Rfl: 0 .  insulin aspart protamine- aspart (NOVOLOG MIX 70/30) (70-30) 100 UNIT/ML injection, Inject 10-35 Units into the skin 2 (two) times daily. , Disp: , Rfl:  .  Lancet Devices (ACCU-CHEK SOFTCLIX) lancets, Check sugar three times daily DX E11.9, Disp: 100 each, Rfl: 3 .  levETIRAcetam (KEPPRA) 500 MG tablet, Take 1 tablet (500 mg total) by mouth 2 (two) times daily. 500 mg bid., Disp: 180 tablet, Rfl: 3 .  metFORMIN (GLUCOPHAGE) 1000 MG tablet, Take 1 tablet (1,000 mg total) by mouth 2 (two) times daily., Disp: 180 tablet, Rfl: 3 .  metoprolol succinate (TOPROL-XL) 100 MG 24 hr tablet, Take 1 tablet (100 mg total) by mouth daily., Disp: 90 tablet, Rfl: 3 .  nitroGLYCERIN (NITROSTAT) 0.4 MG SL tablet, Place 1 tablet (0.4 mg total) under the tongue every 5 (five) minutes as needed for chest pain., Disp: 25 tablet, Rfl: 0 .  oxybutynin (DITROPAN) 5 MG tablet, Take 1 tablet (5 mg total) by mouth 2 (  two) times daily., Disp: 180 tablet, Rfl: 3 .  pantoprazole (PROTONIX) 40 MG tablet, Take 1 tablet (40 mg total) by mouth daily., Disp: 90 tablet, Rfl: 3 .  triamcinolone cream (KENALOG) 0.1 %, Can use thin layer on affected areas 2x daily for one week at a time. Not for face., Disp: 30 g, Rfl: 0 .  levETIRAcetam (KEPPRA) 750 MG tablet, Take 750 mg by mouth 2 (two) times daily., Disp: , Rfl:   Review of Systems  Constitutional: Positive for chills and fatigue.  HENT: Positive for congestion, ear pain, mouth sores, postnasal drip, rhinorrhea and sneezing.   Eyes: Negative.   Respiratory: Positive for cough.     Cardiovascular: Negative.   Gastrointestinal: Negative.   Endocrine: Negative.   Genitourinary: Negative.   Musculoskeletal: Negative.   Skin: Positive for rash.  Allergic/Immunologic: Negative.   Neurological: Positive for headaches.  Hematological: Negative.   Psychiatric/Behavioral: Negative.     Social History   Tobacco Use  . Smoking status: Never Smoker  . Smokeless tobacco: Never Used  Substance Use Topics  . Alcohol use: No   Objective:   BP (!) 160/68 (BP Location: Right Arm, Patient Position: Sitting, Cuff Size: Normal)   Pulse 72   Temp 97.7 F (36.5 C) (Oral)   Resp 16   SpO2 99%  Vitals:   10/08/17 1544  BP: (!) 160/68  Pulse: 72  Resp: 16  Temp: 97.7 F (36.5 C)  TempSrc: Oral  SpO2: 99%     Physical Exam  Constitutional: She is oriented to person, place, and time. She appears well-developed and well-nourished.  HENT:  Head: Normocephalic and atraumatic.  Eyes: Conjunctivae are normal. No scleral icterus.  Neck: No thyromegaly present.  Cardiovascular: Normal rate, regular rhythm and normal heart sounds.  Pulmonary/Chest: Effort normal and breath sounds normal.  Abdominal: Soft.  Neurological: She is alert and oriented to person, place, and time.  Skin: Skin is warm and dry.  Psychiatric: She has a normal mood and affect. Her behavior is normal. Judgment and thought content normal.        Assessment & Plan:     1. Flu-like symptoms/Viral syndrome Fluids,rest,tylenol. - POCT Influenza A/B--negative. 2.TIIDM 3.s/p CVA 4.Morbid Obesity  I have done the exam and reviewed the chart and it is accurate to the best of my knowledge. Development worker, community has been used and  any errors in dictation or transcription are unintentional. Miguel Aschoff M.D. [place], MD  [facility]

## 2017-10-08 NOTE — Telephone Encounter (Signed)
EMMI Follow-up: Called patient as there were questions about Rx being filled, follow-up appointment and discharge papers.  9:37 am - no answer - will try calling again.

## 2017-10-09 ENCOUNTER — Telehealth: Payer: Self-pay

## 2017-10-09 NOTE — Telephone Encounter (Signed)
EMMI Follow-up: 2nd follow-up call, no answer.

## 2017-10-10 ENCOUNTER — Ambulatory Visit (INDEPENDENT_AMBULATORY_CARE_PROVIDER_SITE_OTHER): Payer: PPO | Admitting: Neurology

## 2017-10-10 ENCOUNTER — Encounter: Payer: Self-pay | Admitting: Neurology

## 2017-10-10 DIAGNOSIS — G40909 Epilepsy, unspecified, not intractable, without status epilepticus: Secondary | ICD-10-CM | POA: Diagnosis not present

## 2017-10-10 MED ORDER — CLOPIDOGREL BISULFATE 75 MG PO TABS: 75.0000 mg | ORAL_TABLET | Freq: Every day | ORAL | 3 refills | Status: AC

## 2017-10-10 MED ORDER — LEVETIRACETAM 500 MG PO TABS: 500.0000 mg | ORAL_TABLET | Freq: Two times a day (BID) | ORAL | 3 refills | Status: DC

## 2017-10-10 NOTE — Progress Notes (Addendum)
PATIENT: Susan House DOB: 1956/03/17  REASON FOR VISIT: follow up- TIA , hospitalization for acute worsening- stroke, restless leg syndrome, left hemiparesis HISTORY FROM: patient and husband   HISTORY OF PRESENT ILLNESS:  I had the pleasure to see Susan House today on 10 October 2017, the patient is well established with our practice.  She was seen at [facility] on 04 October 2017 with evolving left-sided hemiparesis, was unable to stand and according to her husband felt weaker in her predisposed left side.  She has a history of removal remote lacunar infarcts in the internal capsule, basal ganglia has a small amount of chronic blood products.  The MRI MRA of the head did not reveal a new stroke and was compared to a prior CT from 03 October 2017 the day of admission.   All MRA related large vessels were patent.  She had some atheromatous irregularities in the flow of the MCA branches, ophthalmic arteries were normal, there is mild distal small vessel disease noted.  She had her first stroke about 19 years ago which initially left her with significant weakness in the left upper and lower extremities, she underwent extensive rehab and subsequently her strength had improved.   However in the morning hours of 20 March she started having worsening weakness of the left upper and lower extremity as well as slurred speech.  After the MRI did not show an acute stroke the patient was worked up under the assumption that this was a TIA she underwent a transthoracic echocardiogram which showed an ejection fraction of 65-70%, normal left ventricle size, slightly calcified mitral valve, very mild stenosis almost trivial for the aortic valve, a moderately dilated left atrium there was no mentioning of pulmonary hypertension being found or not.  Risk factors were diabetes, hypertension, BMI of 44.7,  Susan House was placed back on Plavix,Plavix was discontinued after she had  a GI bleed in 06-2016, colonoscopy confirmed ulceration 06-2016 .  She will continue amlodipine, Lipitor, Lomotil, insulin NovoLog, Lipitor Apresoline, hydrocodone for pain as needed prescribed by Dr. Rosanna Randy, Protonix, Ditropan, metoprolol, nitroglycerin as needed, metformin 1000 mg twice a day, and Keppra 500 mg tablets 2 tablets twice a day.   I have the pleasure of seeing Susan House and her husband today on 04 July 2017, the patient had been evaluated in May for a possible seizure activity versus TIA.  Her EEG returned normal.  She had no more spells since.  Patient is no longer on baclofen, stroke prevention is provided by antiplatelet therapy with aspirin, She is pain-free, she reports no headaches, dizziness or palpitations.  She is walking with a  single point cane.  Today is 11/27/2016 and I have the pleasure of seeing Susan House in the presence of her husband, daughter and granddaughter. Susan House suffered a possible seizure episode on 10/15/2016. [name] daughter described which she had witnessed a day; the patient had fallen out of bed and onto a stool that was placed next to the bed she had very limitedrecollection of what happened and her husband called to the emergency room services. Her daughter met her at the hospital and found her mother still very confused she had speech arrest. She tried to communicate by gesticulation. Was pantng, seemed in discomfort. VS were Ok. Patient remained restless and erratic, needing to nurses to observe her and basically help prevent injuries. Then within an hour or so the patient became suddenly fully aware  of her surroundings. The patient had received nitroglycerin for chest pain, she had some aspirin after her stroke had been ruled out, nausea medication, but she was not given a seizure medicine or sedation.  The EEG during hospitalization was read by Dr. Alexis Goodell and described a drowsy rhythm with rare left temporal sharp transients and  phase reversal at the third temporal lead. Photic stimulation did not show any changes, hyperventilation was deferred. The finding was suggestive of a focal disturbance with epileptogenic potential.The patient was treated by Dr. Tressia Miners, and the discharge diagnosis on April 3 with acute encephalopathy with a new onset seizure, abnormal EEG, started on Keppra 500 mg twice a day to follow-up with neurology outpatient, no driving. She has taking it and feels slowed, thought process delay. Her family feels she is snappy and angry since starting the medication.  She takes narco for back pain. Other medications were baclofen, Lipitor, Flexeril, Lomotil, enalapril, gabapentin, magnesium oxide insulin did troponin, Protonix, nitroglycerin, Toprol. Plavix was discontinued after she had a GI bleed, colonoscopy confirmed ulceration 06-2016 . May re start after 6 month.  MM- 2017 Susan House is a 62 year old female with a history of stroke with subsequent left hemiparesis. Her stroke occurred in 2000. She reports that she has continued on aspirin and Plavix. She is tolerating these medications well. Her blood pressure is slightly elevated today. Her primary care is managing her blood pressure medications. Reports that her last hemoglobin A1c was 8.4. She is currently on Lipitor for her cholesterol. She denies any additional strokelike symptoms. She states that she does have difficulty walking long distances and for that reason she will use a wheelchair. She has very limited movement of the left arm. She has found baclofen to be beneficial for spasms. She also has a history of restless leg symptoms. Her primary care tried her on Requip however she did not find it beneficial. She is currently on gabapentin not specifically for restless leg symptoms. She states that her symptoms are tolerable but does at times interfere with her sleep. She returns today for an evaluation.  HISTORY (Dr. Edwena Felty note): Susan House is a  62 year old Caucasian female patient with significant left hemiparetic residual and left homonymous hemianopsia after her stroke she suffered in 2000. She has continued to take Plavix and aspirin and her risk factors have not changed significantly since our last visit. She has a history of his hypertension, migraine headaches, gastroesophageal reflux, diabetes, hyperlipidemia, obesity, irritable bowel syndrome, urinary incontinence, and was last admitted with a UTI to [facility] in [place]. Admission date was 12/26/2015 under the guidance of Dr. Fulton Reek. Primary doctor is Miguel Aschoff Junior discharge date was 12/28/2015. The patient was diagnosed with altered mental status due to urosepsis. She was treated with 3 antibiotics. Upon discharge the patient was told that she did not have an infection? She was diagnosed with systemic inflammatory response syndrome and encephalopathy. A CT of the head was obtained on the 11th of June, it showed no acute intracranial abnormalities, an MRI of the brain with and without contrast followed on 12/28/2015."   It appeared stable in comparison to a brain image from 2015 with chronic deep left intracranial gliosis, postoperative changes to both globes after cataract surgery, some minor nasal sinus mucosal thickening. Chronic encephalomalacia in the left MCA territory with ex vacuo enlargement of the left frontal horn of the ventricle" she had that stroke in 2000.  The patient reports that on Saturday preceding  her admission to the hospital she had worked on her parents state ready to dissolve the estate. She was at least 8 hours out on a very hot and sunny day working outside not sure that she hydrated properly developing a terrific headache she cannot remember what happened that night from Saturday to Sunday and her husband reports that she was not able to sleep for a couple of nights. He accompanied her to the hospital. It is likely  that she had a heat related accident. She missed a couple of medicine doses as well.    REVIEW OF SYSTEMS: Out of a complete 14 system review of symptoms, the patient complains only of the following symptoms, and all other reviewed systems are negative.  She is back to baseline after presumed TIA. No seizure .  ALLERGIES: Allergies  Allergen Reactions  . Morphine And Related Anaphylaxis  . Fentanyl Other (See Comments)    Unknown reaction.  . Reglan [Metoclopramide]     Other reaction(s): Unknown Elevated BP  . Simvastatin Other (See Comments)    Muscle pain  . Betadine [Povidone Iodine] Rash  . Iodine Rash    Other reaction(s): Unknown  . Tetracyclines & Related Rash    Other reaction(s): Unknown    HOME MEDICATIONS: Outpatient Medications Prior to Visit  Medication Sig Dispense Refill  . ACCU-CHEK AVIVA PLUS test strip TEST BLOOD SUGAR THREE TIMES DAILY 300 each 3  . ACCU-CHEK SOFTCLIX LANCETS lancets CHECK BLOOD SUGAR THREE TIMES DAILY 300 each 3  . acyclovir ointment (ZOVIRAX) 5 % Apply 1 application topically every 3 (three) hours. 5 g 1  . amLODipine (NORVASC) 10 MG tablet Take 1 tablet (10 mg total) by mouth daily. 90 tablet 3  . atorvastatin (LIPITOR) 20 MG tablet Take 20 mg by mouth daily.    . Blood Glucose Monitoring Suppl (ACCU-CHEK AVIVA PLUS) w/Device KIT Check sugar three times daily DX E11.9-needs a meter 1 kit 0  . clopidogrel (PLAVIX) 75 MG tablet Take 1 tablet (75 mg total) by mouth daily. 90 tablet 0  . diphenoxylate-atropine (LOMOTIL) 2.5-0.025 MG tablet 2 tablets initially and then 1 tablet every 6 hours as needed for diarrhea 60 tablet 1  . enalapril (VASOTEC) 20 MG tablet Take 1 tablet (20 mg total) by mouth 2 (two) times daily. 180 tablet 3  . hydrALAZINE (APRESOLINE) 100 MG tablet Take 1 tablet (100 mg total) by mouth 2 (two) times daily. 60 tablet 3  . HYDROcodone-acetaminophen (NORCO) 10-325 MG tablet Take 1-2 tablets by mouth every 6 (six) hours as  needed. 60 tablet 0  . insulin aspart protamine- aspart (NOVOLOG MIX 70/30) (70-30) 100 UNIT/ML injection Inject 10-35 Units into the skin 2 (two) times daily.     [employer] Devices (ACCU-CHEK SOFTCLIX) lancets Check sugar three times daily DX E11.9 100 each 3  . levETIRAcetam (KEPPRA) 500 MG tablet Take 1 tablet (500 mg total) by mouth 2 (two) times daily. 500 mg bid. 180 tablet 3  . metFORMIN (GLUCOPHAGE) 1000 MG tablet Take 1 tablet (1,000 mg total) by mouth 2 (two) times daily. 180 tablet 3  . metoprolol succinate (TOPROL-XL) 100 MG 24 hr tablet Take 1 tablet (100 mg total) by mouth daily. 90 tablet 3  . nitroGLYCERIN (NITROSTAT) 0.4 MG SL tablet Place 1 tablet (0.4 mg total) under the tongue every 5 (five) minutes as needed for chest pain. 25 tablet 0  . oxybutynin (DITROPAN) 5 MG tablet Take 1 tablet (5 mg total) by mouth 2 (  two) times daily. 180 tablet 3  . pantoprazole (PROTONIX) 40 MG tablet Take 1 tablet (40 mg total) by mouth daily. 90 tablet 3  . triamcinolone cream (KENALOG) 0.1 % Can use thin layer on affected areas 2x daily for one week at a time. Not for face. 30 g 0  . levETIRAcetam (KEPPRA) 750 MG tablet Take 750 mg by mouth 2 (two) times daily.     No facility-administered medications prior to visit.     PAST MEDICAL HISTORY: Past Medical History:  Diagnosis Date  . Allergy   . Arthritis   . Cellulitis and abscess of face   . Depression   . Diabetes mellitus ([place])   . Edema   . GERD (gastroesophageal reflux disease)   . Hematuria   . History of echocardiogram    2008: normal, 2011: normal LVSF  . History of nuclear stress test    a. 12/2009: lexiscan - negative  . Hyperlipidemia   . Hypertension   . IBS (irritable bowel syndrome)   . Migraine   . Morbid obesity ([place])   . Nocturia   . Stroke ([place]) 2000  . TIA (transient ischemic attack) 2010  . Urgency of micturation   . Urinary frequency   . Urinary incontinence     PAST SURGICAL HISTORY: Past Surgical  History:  Procedure Laterality Date  . ABDOMINAL HYSTERECTOMY    . ABDOMINOPLASTY    . APPENDECTOMY    . BACK SURGERY     extensive spinal fusion  . carpel tunnel surgery    . CATARACT EXTRACTION     x 2  . CESAREAN SECTION    . CHOLECYSTECTOMY    . COLONOSCOPY  2010  . COLONOSCOPY WITH PROPOFOL N/A 07/11/2016   Procedure: COLONOSCOPY WITH PROPOFOL;  Surgeon: Lucilla Lame, MD;  Location: [facility] ENDOSCOPY;  Service: Endoscopy;  Laterality: N/A;  . EYE SURGERY    . HERNIA REPAIR    . SHOULDER SURGERY    . TOE SURGERY    . UPPER GI ENDOSCOPY    . WRIST FRACTURE SURGERY Left     FAMILY HISTORY: Family History  Problem Relation Age of Onset  . Hyperlipidemia Mother   . Arrhythmia Mother        WPW  . Hypertension Mother   . Rheum arthritis Mother   . Heart disease Mother   . Heart attack Father 23  . Hyperlipidemia Father   . Hypertension Father   . Stroke Father   . Diabetes Father   . Heart disease Father   . Coronary artery disease Father   . Diabetes Sister   . Hyperlipidemia Sister   . Hypertension Sister   . Depression Sister   . Depression Sister   . Diabetes Sister   . Hypertension Sister   . Hyperlipidemia Sister   . Kidney disease Paternal Grandmother     SOCIAL HISTORY: Social History   Socioeconomic History  . Marital status: Married    Spouse name: Marcello Moores   . Number of children: 2  . Years of education: 54  . Highest education level: Not on file  Occupational History  . Occupation: Disabled   . Occupation: retired  Scientific laboratory technician  . Financial resource strain: Not on file  . Food insecurity:    Worry: Not on file    Inability: Not on file  . Transportation needs:    Medical: Not on file    Non-medical: Not on file  Tobacco Use  . Smoking status:  Never Smoker  . Smokeless tobacco: Never Used  Substance and Sexual Activity  . Alcohol use: No  . Drug use: No  . Sexual activity: Never  Lifestyle  . Physical activity:    Days per week: Not  on file    Minutes per session: Not on file  . Stress: Not on file  Relationships  . Social connections:    Talks on phone: Not on file    Gets together: Not on file    Attends religious service: Not on file    Active member of club or organization: Not on file    Attends meetings of clubs or organizations: Not on file    Relationship status: Not on file  . Intimate partner violence:    Fear of current or ex partner: Not on file    Emotionally abused: Not on file    Physically abused: Not on file    Forced sexual activity: Not on file  Other Topics Concern  . Not on file  Social History Narrative   ** Merged History Encounter **       Patient lives at home with husband Marcello Moores.    Patient has 2 children and 2 step children.    Patient has 12+ years of education.    Patient is Disabled.       PHYSICAL EXAM  Vitals:   10/10/17 1020  BP: (!) 169/74  Pulse: 75  Weight: 232 lb (105.2 kg)  Height: _0  (1.549 m)   Body mass index is 43.84 kg/m.  Generalized: Well developed, in no acute distress   Neurological examination  Mentation: Alert oriented to time, place, history taking. She speaks fluently- Cranial nerve :  No change in taste or smell, but less appetite.  Pupils were equal, status post cataract, she has diabetic retinopathy.    Extraocular movements were full, visual field were full on confrontational test. Facial sensation normal. Uvula and  tongue midline. Head turning and shoulder shrug  were intact . Right shoulder is lower.  Motor: Limited movement in the left arm particularly in the elbow and hands.  Spasticity over left extremities  Sensory: Sensory testing is intact to soft touch on all 4 extremities however slightly decreased on the left hand. Coordination: Cerebellar testing reveals good finger-nose-finger on the right, with mild dysmetria and pronator drift  difficulty on the left. Reflexes: 2/2     DIAGNOSTIC DATA (LABS, IMAGING, TESTING) - I  reviewed patient records, labs, notes, testing and imaging myself where available. I reviewed the patient's MRI and MRA, Echocardiogram form [facility]. .   Lab Results  Component Value Date   WBC 7.1 10/03/2017   HGB 11.7 (L) 10/03/2017   HCT 35.1 10/03/2017   MCV 86.5 10/03/2017   PLT 213 10/03/2017      Component Value Date/Time   NA 138 10/03/2017 1734   NA 137 07/25/2016 1429   NA 140 06/17/2014 0744   K 3.4 (L) 10/03/2017 1734   K 4.1 06/17/2014 0744   CL 105 10/03/2017 1734   CL 105 06/17/2014 0744   CO2 25 10/03/2017 1734   CO2 27 06/17/2014 0744   GLUCOSE 94 10/03/2017 1734   GLUCOSE 116 (H) 06/17/2014 0744   BUN 23 (H) 10/03/2017 1734   BUN 22 07/25/2016 1429   BUN 35 (H) 06/17/2014 0744   CREATININE 0.95 10/03/2017 1734   CREATININE 1.29 06/17/2014 0744   CALCIUM 8.4 (L) 10/03/2017 1734   CALCIUM 8.6 06/17/2014 0744  PROT 6.7 10/03/2017 1734   PROT 6.7 07/25/2016 1429   PROT 6.5 01/18/2014 0059   ALBUMIN 3.6 10/03/2017 1734   ALBUMIN 4.1 07/25/2016 1429   ALBUMIN 3.0 (L) 01/18/2014 0059   AST 27 10/03/2017 1734   AST 21 01/18/2014 0059   ALT 23 10/03/2017 1734   ALT 18 01/18/2014 0059   ALKPHOS 68 10/03/2017 1734   ALKPHOS 97 01/18/2014 0059   BILITOT 0.5 10/03/2017 1734   BILITOT 0.4 07/25/2016 1429   BILITOT 0.3 01/18/2014 0059   GFRNONAA >60 10/03/2017 1734   GFRNONAA 45 (L) 06/17/2014 0744   GFRNONAA 47 (L) 01/18/2014 0059   GFRAA >60 10/03/2017 1734   GFRAA 55 (L) 06/17/2014 0744   GFRAA 55 (L) 01/18/2014 0059   Lab Results  Component Value Date   CHOL 97 10/04/2017   HDL 48 10/04/2017   LDLCALC 39 10/04/2017   TRIG 51 10/04/2017   CHOLHDL 2.0 10/04/2017   Lab Results  Component Value Date   HGBA1C 7.8 (H) 10/04/2017   Lab Results  Component Value Date   VITAMINB12 551 01/15/2009   Lab Results  Component Value Date   TSH 3.370 07/18/2016      ASSESSMENT AND PLAN 62 y.o. year old female  has a past medical history of Allergy,  Arthritis, Cellulitis and abscess of face, Depression, Diabetes mellitus ([place]), Edema, GERD (gastroesophageal reflux disease), Hematuria, History of echocardiogram, History of nuclear stress test, Hyperlipidemia, Hypertension, IBS (irritable bowel syndrome), Migraine, Morbid obesity ([place]), Nocturia, Stroke ([place]) (2000), TIA (transient ischemic attack) (2010), Urgency of micturation, Urinary frequency, and Urinary incontinence. here with:  Spells. - TIA versus stroke, history of left  hemispheric stroke in 2000, hemiplegia worsened temporarily- work up March 20-21 at [facility]  .    1. History of stroke with enkephalomalacia. New MRI confirmed again no new stroke.  TIA-  she restarted on plavix and D/C  ASA 81 mg.  Uncontrolle blood pressures on multiple medication-  Has not had renal artery stenosis work up yet.  DM running high,  also not narrowly controlled.    2. Left hemiparesis post CVA , gait disorder, slowed and haltering speech.  Stroke from 2000.   3. Possible single seizure ( ? ) in 2018 with postiktal confusion. Had multiple spells before.  Stays on Keppra 500 mg bid for control- has not had another spell. EEG normal in office, supposingly abnormal in hospital. She stays on Keppra as she is allowed to drive now and doesn't want to lose that previlege for 6 month after d/c seizure medications.  She has no more RLS ! . Cut out caffeine. Keep on plavix, try exercise- aqua gymnastic ( she is a non swimmer) , chair yoga.    Rv in Dec with me and in 12 month with Stroke NP, Rudean Curt Schaick .  Larey Seat, MD   10/10/2017, 11:00 AM [facility] [address] [place], [place] [address] [phone]

## 2017-10-11 ENCOUNTER — Inpatient Hospital Stay: Payer: PPO | Admitting: Family Medicine

## 2017-10-11 ENCOUNTER — Telehealth: Payer: Self-pay | Admitting: Licensed Clinical Social Worker

## 2017-10-11 ENCOUNTER — Telehealth: Payer: Self-pay | Admitting: Family Medicine

## 2017-10-11 NOTE — Telephone Encounter (Signed)
Patient was seen in office 10/08/2017 with flu like symptoms. Please advise?

## 2017-10-11 NOTE — Telephone Encounter (Signed)
Patient called and stated that she is now having sinus pain over her eyes and in her cheek.    CB# [phone]

## 2017-10-11 NOTE — Telephone Encounter (Signed)
EMMI flagged patient for answering yes to loss of interest in things. Clinical Education officer, museum (CSW) attempted to contact patient twice with no answer. The phone continued to ring and a voicemail did not pick up.   [employer], LCSW [phone]

## 2017-10-12 ENCOUNTER — Telehealth: Payer: Self-pay | Admitting: Licensed Clinical Social Worker

## 2017-10-12 NOTE — Telephone Encounter (Signed)
EMMI flagged patient for answering yes to questions about Rx being filled, follow-up appointment, discharge papers and loss of interest in things. Clinical Education officer, museum (CSW) was able to reach patient via telephone on a 3rd attempt. Patient reported that she is going fine and does not have any questions or concerns. Patient reported that she has not lost interest in things and she hit the wrong buttons. No future call is needed.   [employer], LCSW [phone]

## 2017-10-17 DIAGNOSIS — L2089 Other atopic dermatitis: Secondary | ICD-10-CM | POA: Diagnosis not present

## 2017-10-19 ENCOUNTER — Encounter: Payer: Self-pay | Admitting: Family Medicine

## 2017-10-19 ENCOUNTER — Ambulatory Visit (INDEPENDENT_AMBULATORY_CARE_PROVIDER_SITE_OTHER): Payer: PPO | Admitting: Family Medicine

## 2017-10-19 VITALS — BP 176/82 | HR 66 | Temp 98.0°F | Resp 16

## 2017-10-19 DIAGNOSIS — I10 Essential (primary) hypertension: Secondary | ICD-10-CM | POA: Diagnosis not present

## 2017-10-19 MED ORDER — CLONIDINE HCL 0.1 MG PO TABS: 0.1000 mg | ORAL_TABLET | Freq: Two times a day (BID) | ORAL | 3 refills | Status: DC

## 2017-10-19 NOTE — Progress Notes (Signed)
Patient: Susan House Female    DOB: 05/12/1956   62 y.o.   MRN: [id number] Visit Date: 10/19/2017  Today's Provider: Lavon Paganini, MD   I, Martha Clan, CMA, am acting as scribe for Lavon Paganini, MD.  Chief Complaint  Patient presents with  . Hypertension   Subjective:    Hypertension  The problem has been gradually worsening since onset. The problem is uncontrolled. Associated symptoms include headaches and malaise/fatigue. Pertinent negatives include no anxiety, blurred vision, chest pain, orthopnea, palpitations, peripheral edema, shortness of breath or sweats. There are no associated agents to hypertension. Risk factors for coronary artery disease include dyslipidemia, post-menopausal state and diabetes mellitus.  Pt is currently taking amlodipine 10 mg, enalapril 20 mg BID, hydralazine 100 mg BID for HTN. Her BP readings at home have been reading 160's-170's/60's-70's. She is c/o recent fatigue and headache this morning, but denies any other neurological or cardiovascular s/s.     Allergies  Allergen Reactions  . Morphine And Related Anaphylaxis  . Fentanyl Other (See Comments)    Unknown reaction.  . Reglan [Metoclopramide]     Other reaction(s): Unknown Elevated BP  . Simvastatin Other (See Comments)    Muscle pain  . Betadine [Povidone Iodine] Rash  . Iodine Rash    Other reaction(s): Unknown  . Tetracyclines & Related Rash    Other reaction(s): Unknown     Current Outpatient Medications:  .  ACCU-CHEK AVIVA PLUS test strip, TEST BLOOD SUGAR THREE TIMES DAILY, Disp: 300 each, Rfl: 3 .  ACCU-CHEK SOFTCLIX LANCETS lancets, CHECK BLOOD SUGAR THREE TIMES DAILY, Disp: 300 each, Rfl: 3 .  acyclovir ointment (ZOVIRAX) 5 %, Apply 1 application topically every 3 (three) hours., Disp: 5 g, Rfl: 1 .  amLODipine (NORVASC) 10 MG tablet, Take 1 tablet (10 mg total) by mouth daily., Disp: 90 tablet, Rfl: 3 .  atorvastatin (LIPITOR) 20 MG tablet, Take 20 mg  by mouth daily., Disp: , Rfl:  .  Blood Glucose Monitoring Suppl (ACCU-CHEK AVIVA PLUS) w/Device KIT, Check sugar three times daily DX E11.9-needs a meter, Disp: 1 kit, Rfl: 0 .  clopidogrel (PLAVIX) 75 MG tablet, Take 1 tablet (75 mg total) by mouth daily., Disp: 90 tablet, Rfl: 3 .  enalapril (VASOTEC) 20 MG tablet, Take 1 tablet (20 mg total) by mouth 2 (two) times daily., Disp: 180 tablet, Rfl: 3 .  hydrALAZINE (APRESOLINE) 100 MG tablet, Take 1 tablet (100 mg total) by mouth 2 (two) times daily., Disp: 60 tablet, Rfl: 3 .  HYDROcodone-acetaminophen (NORCO) 10-325 MG tablet, Take 1-2 tablets by mouth every 6 (six) hours as needed., Disp: 60 tablet, Rfl: 0 .  insulin aspart protamine- aspart (NOVOLOG MIX 70/30) (70-30) 100 UNIT/ML injection, Inject 10-35 Units into the skin 2 (two) times daily. , Disp: , Rfl:  .  Lancet Devices (ACCU-CHEK SOFTCLIX) lancets, Check sugar three times daily DX E11.9, Disp: 100 each, Rfl: 3 .  levETIRAcetam (KEPPRA) 500 MG tablet, Take 1 tablet (500 mg total) by mouth 2 (two) times daily. 500 mg bid., Disp: 180 tablet, Rfl: 3 .  metFORMIN (GLUCOPHAGE) 1000 MG tablet, Take 1 tablet (1,000 mg total) by mouth 2 (two) times daily., Disp: 180 tablet, Rfl: 3 .  metoprolol succinate (TOPROL-XL) 100 MG 24 hr tablet, Take 1 tablet (100 mg total) by mouth daily., Disp: 90 tablet, Rfl: 3 .  nitroGLYCERIN (NITROSTAT) 0.4 MG SL tablet, Place 1 tablet (0.4 mg total) under the tongue every 5 (  five) minutes as needed for chest pain., Disp: 25 tablet, Rfl: 0 .  oxybutynin (DITROPAN) 5 MG tablet, Take 1 tablet (5 mg total) by mouth 2 (two) times daily., Disp: 180 tablet, Rfl: 3 .  pantoprazole (PROTONIX) 40 MG tablet, Take 1 tablet (40 mg total) by mouth daily., Disp: 90 tablet, Rfl: 3 .  triamcinolone cream (KENALOG) 0.1 %, Can use thin layer on affected areas 2x daily for one week at a time. Not for face., Disp: 30 g, Rfl: 0 .  diphenoxylate-atropine (LOMOTIL) 2.5-0.025 MG tablet, 2  tablets initially and then 1 tablet every 6 hours as needed for diarrhea (Patient not taking: Reported on 10/19/2017), Disp: 60 tablet, Rfl: 1  Review of Systems  Constitutional: Positive for fatigue and malaise/fatigue. Negative for activity change, appetite change, chills, diaphoresis, fever and unexpected weight change.  Eyes: Negative for blurred vision.  Respiratory: Negative for shortness of breath.   Cardiovascular: Negative for chest pain, palpitations, orthopnea and leg swelling.  Neurological: Positive for dizziness (intermittent) and headaches. Negative for syncope, speech difficulty, weakness and numbness.  Psychiatric/Behavioral: Negative for confusion.    Social History   Tobacco Use  . Smoking status: Never Smoker  . Smokeless tobacco: Never Used  Substance Use Topics  . Alcohol use: No   Objective:   BP (!) 176/82 (BP Location: Right Arm, Patient Position: Sitting, Cuff Size: Large)   Pulse 66   Temp 98 F (36.7 C) (Oral)   Resp 16   SpO2 98%  Vitals:   10/19/17 1130  BP: (!) 176/82  Pulse: 66  Resp: 16  Temp: 98 F (36.7 C)  TempSrc: Oral  SpO2: 98%     Physical Exam  Constitutional: She is oriented to person, place, and time. She appears well-developed and well-nourished. No distress.  HENT:  Head: Normocephalic and atraumatic.  Eyes: Conjunctivae are normal. No scleral icterus.  Neck: Neck supple.  Cardiovascular: Normal rate, regular rhythm and normal heart sounds.  No murmur heard. Pulmonary/Chest: Effort normal and breath sounds normal. No respiratory distress. She has no wheezes. She has no rales.  Musculoskeletal: She exhibits no edema.  Lymphadenopathy:    She has no cervical adenopathy.  Neurological: She is alert and oriented to person, place, and time.  Skin: Skin is warm and dry. No rash noted.  Psychiatric: She has a normal mood and affect. Her behavior is normal.  Vitals reviewed.       Assessment & Plan:   Problem List Items  Addressed This Visit      Cardiovascular and Mediastinum   Essential hypertension - Primary    Uncontrolled Continue current medications Add clonidine 0.56m BID Has f/u scheduled next month with PCP      Relevant Medications   cloNIDine (CATAPRES) 0.1 MG tablet      The entirety of the information documented in the History of Present Illness, Review of Systems and Physical Exam were personally obtained by me. Portions of this information were initially documented by ERaquel SarnaRatchford, CMA and reviewed by me for thoroughness and accuracy.    BVirginia Crews MD, MPH [facility]4/11/2017 3:37 PM

## 2017-10-19 NOTE — Assessment & Plan Note (Signed)
Uncontrolled Continue current medications Add clonidine 0.1mg  BID Has f/u scheduled next month with PCP

## 2017-10-19 NOTE — Patient Instructions (Signed)
DASH Eating Plan DASH stands for "Dietary Approaches to Stop Hypertension." The DASH eating plan is a healthy eating plan that has been shown to reduce high blood pressure (hypertension). It may also reduce your risk for type 2 diabetes, heart disease, and stroke. The DASH eating plan may also help with weight loss. What are tips for following this plan? General guidelines  Avoid eating more than 2,300 mg (milligrams) of salt (sodium) a day. If you have hypertension, you may need to reduce your sodium intake to 1,500 mg a day.  Limit alcohol intake to no more than 1 drink a day for nonpregnant women and 2 drinks a day for men. One drink equals 12 oz of beer, 5 oz of wine, or 1 oz of hard liquor.  Work with your health care provider to maintain a healthy body weight or to lose weight. Ask what an ideal weight is for you.  Get at least 30 minutes of exercise that causes your heart to beat faster (aerobic exercise) most days of the week. Activities may include walking, swimming, or biking.  Work with your health care provider or diet and nutrition specialist (dietitian) to adjust your eating plan to your individual calorie needs. Reading food labels  Check food labels for the amount of sodium per serving. Choose foods with less than 5 percent of the Daily Value of sodium. Generally, foods with less than 300 mg of sodium per serving fit into this eating plan.  To find whole grains, look for the word "whole" as the first word in the ingredient list. Shopping  Buy products labeled as "low-sodium" or "no salt added."  Buy fresh foods. Avoid canned foods and premade or frozen meals. Cooking  Avoid adding salt when cooking. Use salt-free seasonings or herbs instead of table salt or sea salt. Check with your health care provider or pharmacist before using salt substitutes.  Do not fry foods. Cook foods using healthy methods such as baking, boiling, grilling, and broiling instead.  Cook with  heart-healthy oils, such as olive, canola, soybean, or sunflower oil. Meal planning   Eat a balanced diet that includes: ? 5 or more servings of fruits and vegetables each day. At each meal, try to fill half of your plate with fruits and vegetables. ? Up to 6-8 servings of whole grains each day. ? Less than 6 oz of lean meat, poultry, or fish each day. A 3-oz serving of meat is about the same size as a deck of cards. One egg equals 1 oz. ? 2 servings of low-fat dairy each day. ? A serving of nuts, seeds, or beans 5 times each week. ? Heart-healthy fats. Healthy fats called Omega-3 fatty acids are found in foods such as flaxseeds and coldwater fish, like sardines, salmon, and mackerel.  Limit how much you eat of the following: ? Canned or prepackaged foods. ? Food that is high in trans fat, such as fried foods. ? Food that is high in saturated fat, such as fatty meat. ? Sweets, desserts, sugary drinks, and other foods with added sugar. ? Full-fat dairy products.  Do not salt foods before eating.  Try to eat at least 2 vegetarian meals each week.  Eat more home-cooked food and less restaurant, buffet, and fast food.  When eating at a restaurant, ask that your food be prepared with less salt or no salt, if possible. What foods are recommended? The items listed may not be a complete list. Talk with your dietitian about what   dietary choices are best for you. Grains Whole-grain or whole-wheat bread. Whole-grain or whole-wheat pasta. Brown rice. Oatmeal. Quinoa. Bulgur. Whole-grain and low-sodium cereals. Pita bread. Low-fat, low-sodium crackers. Whole-wheat flour tortillas. Vegetables Fresh or frozen vegetables (raw, steamed, roasted, or grilled). Low-sodium or reduced-sodium tomato and vegetable juice. Low-sodium or reduced-sodium tomato sauce and tomato paste. Low-sodium or reduced-sodium canned vegetables. Fruits All fresh, dried, or frozen fruit. Canned fruit in natural juice (without  added sugar). Meat and other protein foods Skinless chicken or turkey. Ground chicken or turkey. Pork with fat trimmed off. Fish and seafood. Egg whites. Dried beans, peas, or lentils. Unsalted nuts, nut butters, and seeds. Unsalted canned beans. Lean cuts of beef with fat trimmed off. Low-sodium, lean deli meat. Dairy Low-fat (1%) or fat-free (skim) milk. Fat-free, low-fat, or reduced-fat cheeses. Nonfat, low-sodium ricotta or cottage cheese. Low-fat or nonfat yogurt. Low-fat, low-sodium cheese. Fats and oils Soft margarine without trans fats. Vegetable oil. Low-fat, reduced-fat, or light mayonnaise and salad dressings (reduced-sodium). Canola, safflower, olive, soybean, and sunflower oils. Avocado. Seasoning and other foods Herbs. Spices. Seasoning mixes without salt. Unsalted popcorn and pretzels. Fat-free sweets. What foods are not recommended? The items listed may not be a complete list. Talk with your dietitian about what dietary choices are best for you. Grains Baked goods made with fat, such as croissants, muffins, or some breads. Dry pasta or rice meal packs. Vegetables Creamed or fried vegetables. Vegetables in a cheese sauce. Regular canned vegetables (not low-sodium or reduced-sodium). Regular canned tomato sauce and paste (not low-sodium or reduced-sodium). Regular tomato and vegetable juice (not low-sodium or reduced-sodium). Pickles. Olives. Fruits Canned fruit in a light or heavy syrup. Fried fruit. Fruit in cream or butter sauce. Meat and other protein foods Fatty cuts of meat. Ribs. Fried meat. Bacon. Sausage. Bologna and other processed lunch meats. Salami. Fatback. Hotdogs. Bratwurst. Salted nuts and seeds. Canned beans with added salt. Canned or smoked fish. Whole eggs or egg yolks. Chicken or turkey with skin. Dairy Whole or 2% milk, cream, and half-and-half. Whole or full-fat cream cheese. Whole-fat or sweetened yogurt. Full-fat cheese. Nondairy creamers. Whipped toppings.  Processed cheese and cheese spreads. Fats and oils Butter. Stick margarine. Lard. Shortening. Ghee. Bacon fat. Tropical oils, such as coconut, palm kernel, or palm oil. Seasoning and other foods Salted popcorn and pretzels. Onion salt, garlic salt, seasoned salt, table salt, and sea salt. Worcestershire sauce. Tartar sauce. Barbecue sauce. Teriyaki sauce. Soy sauce, including reduced-sodium. Steak sauce. Canned and packaged gravies. Fish sauce. Oyster sauce. Cocktail sauce. Horseradish that you find on the shelf. Ketchup. Mustard. Meat flavorings and tenderizers. Bouillon cubes. Hot sauce and Tabasco sauce. Premade or packaged marinades. Premade or packaged taco seasonings. Relishes. Regular salad dressings. Where to find more information:  National Heart, Lung, and Blood Institute: www.nhlbi.nih.gov  American Heart Association: www.heart.org Summary  The DASH eating plan is a healthy eating plan that has been shown to reduce high blood pressure (hypertension). It may also reduce your risk for type 2 diabetes, heart disease, and stroke.  With the DASH eating plan, you should limit salt (sodium) intake to 2,300 mg a day. If you have hypertension, you may need to reduce your sodium intake to 1,500 mg a day.  When on the DASH eating plan, aim to eat more fresh fruits and vegetables, whole grains, lean proteins, low-fat dairy, and heart-healthy fats.  Work with your health care provider or diet and nutrition specialist (dietitian) to adjust your eating plan to your individual   calorie needs. This information is not intended to replace advice given to you by your health care provider. Make sure you discuss any questions you have with your health care provider. Document Released: 06/22/2011 Document Revised: 06/26/2016 Document Reviewed: 06/26/2016 Elsevier Interactive Patient Education  2018 [employer]  

## 2017-10-21 IMAGING — CT CT ABD-PELV W/O CM
2 of 7 series · 14 of 46 positions shown, 19 images · non-contrast
Comparison: 06/10/2014

CLINICAL DATA: Left lower quadrant pain

EXAM:
CT ABDOMEN AND PELVIS WITHOUT CONTRAST
TECHNIQUE: Multidetector CT imaging of the abdomen and pelvis was performed
following the standard protocol without IV contrast.

[Series 2: routine abd pel wo · axial · 0.98mm/px · z∈[-950,-516]mm · 11 of 99 slices shown, 16 images]
[im 6/99  soft-tissue]
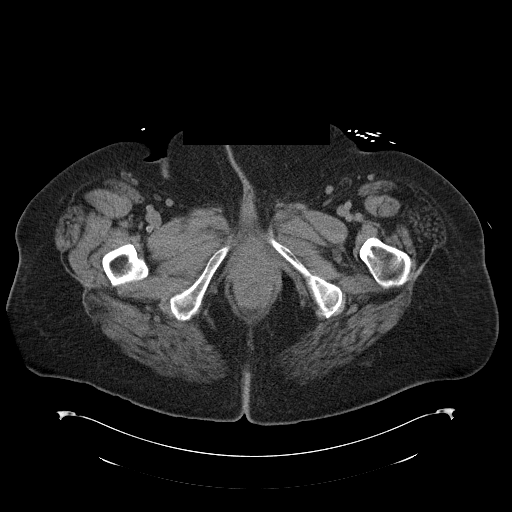
[im 6/99  bone]
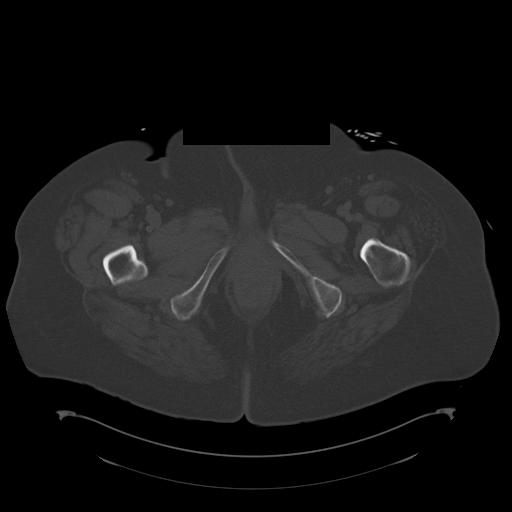
[im 18/99  soft-tissue]
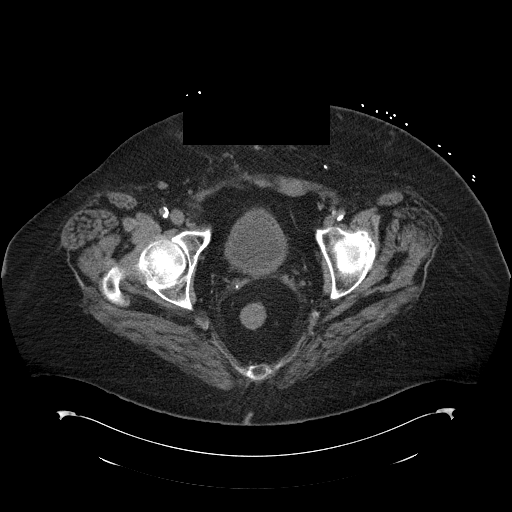
[im 29/99  soft-tissue]
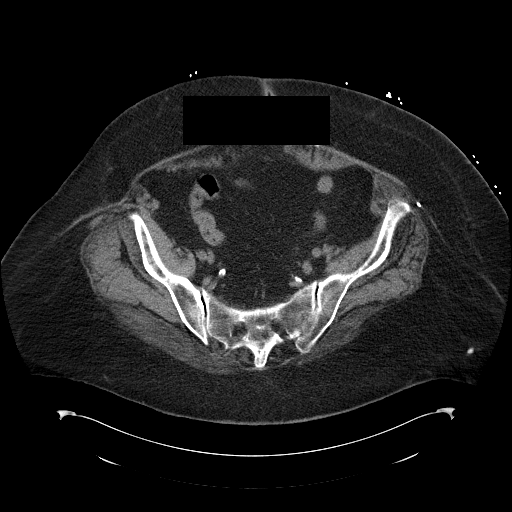
[im 35/99  soft-tissue]
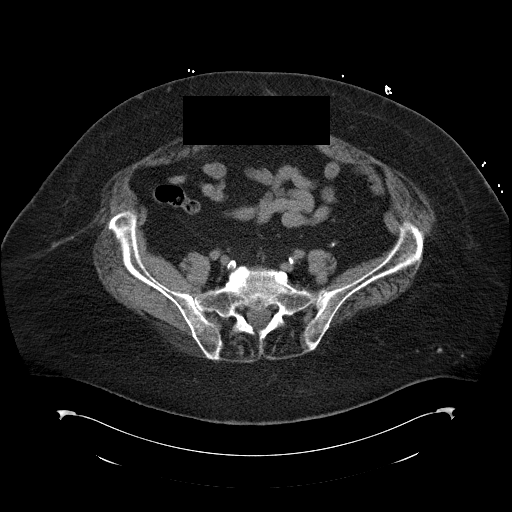
[im 47/99  soft-tissue]
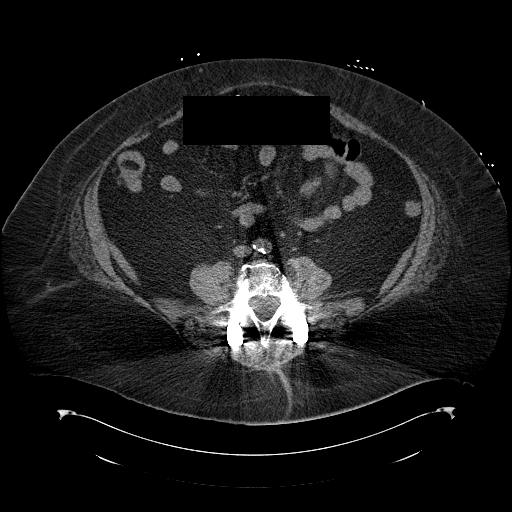
[im 52/99  soft-tissue]
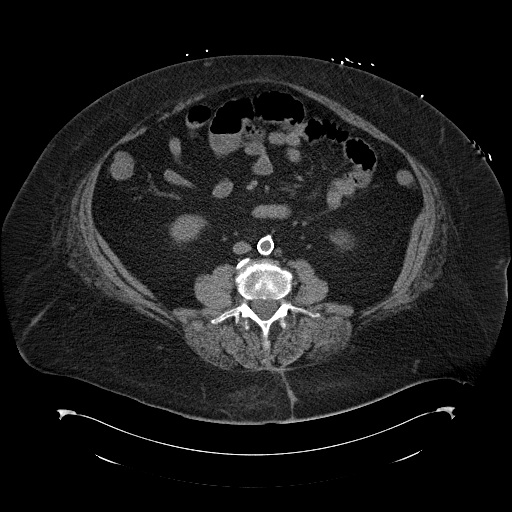
[im 64/99  soft-tissue]
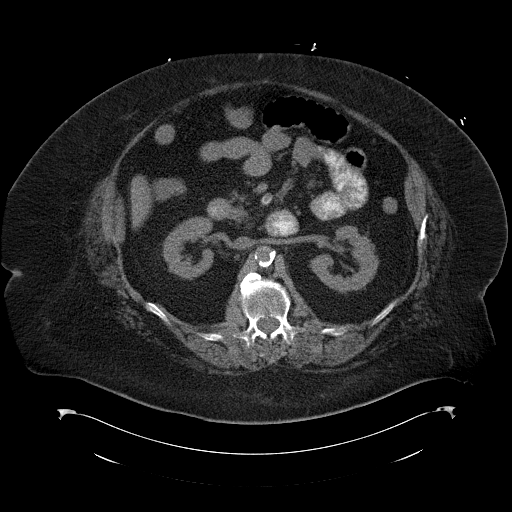
[im 75/99  soft-tissue]
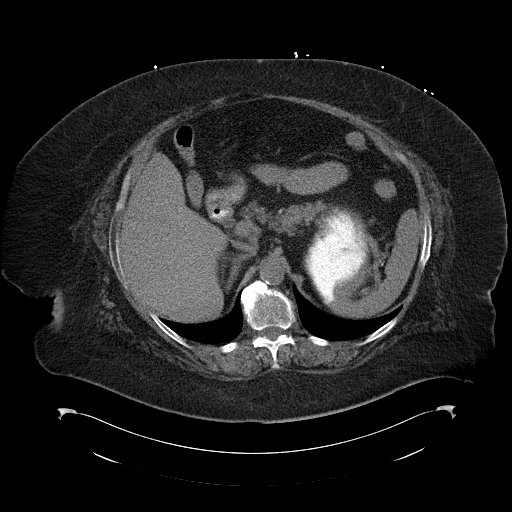
[im 75/99  lung]
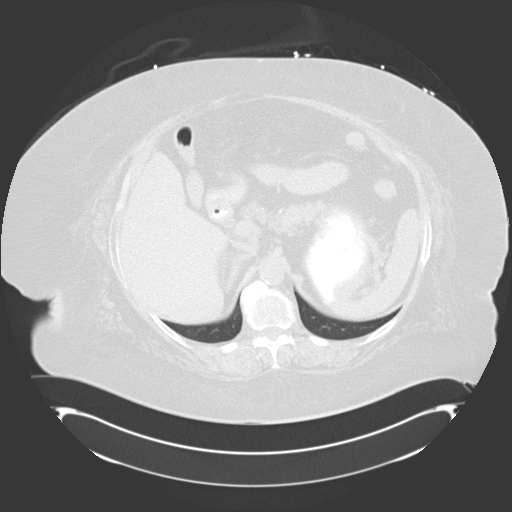
[im 81/99  soft-tissue]
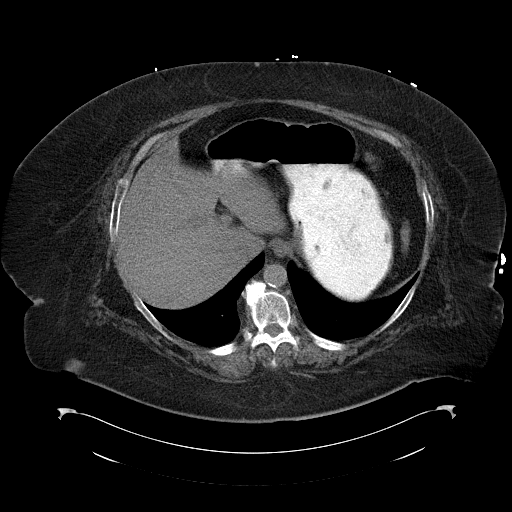
[im 81/99  lung]
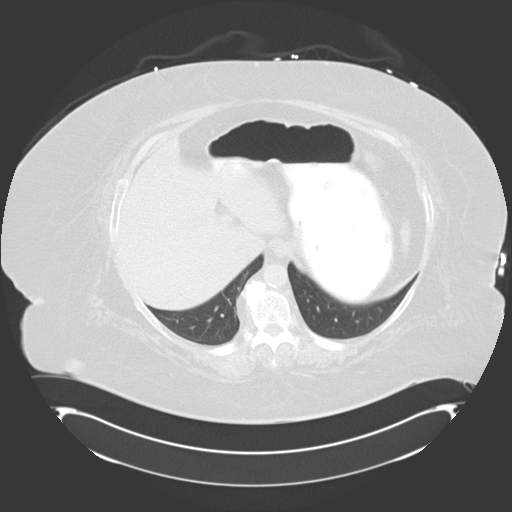
[im 81/99  bone]
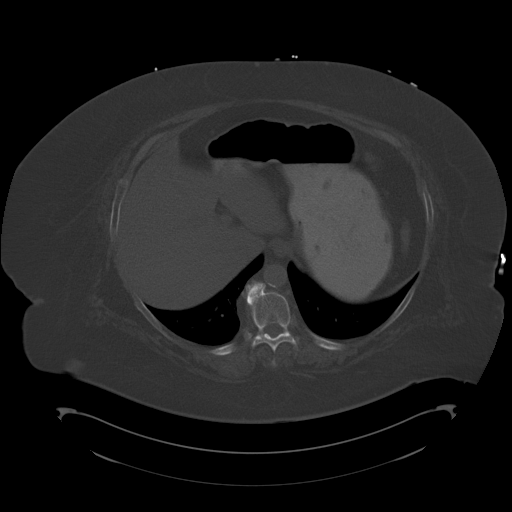
[im 87/99  lung]
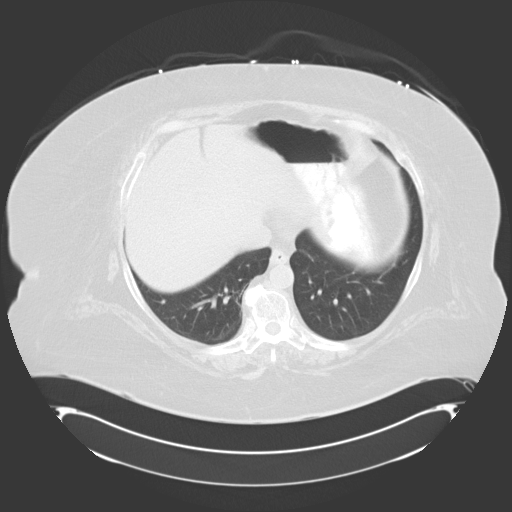
[im 93/99  soft-tissue]
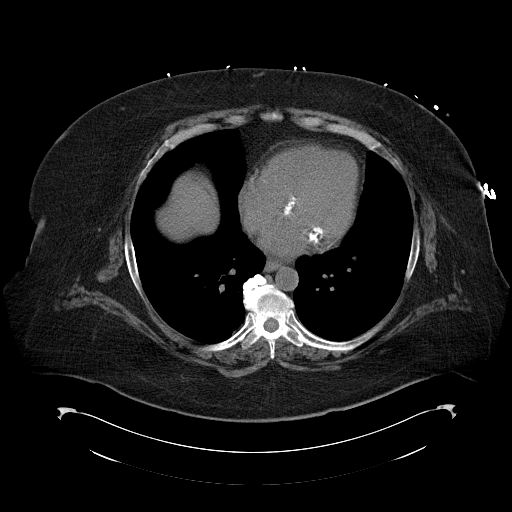
[im 93/99  lung]
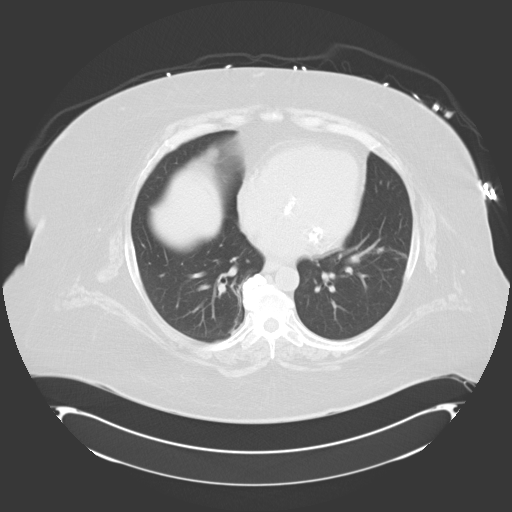

[Series 7: cor routine abd pel wo · coronal · 0.90mm/px · 3 of 182 slices shown]
[im 46/182  soft-tissue]
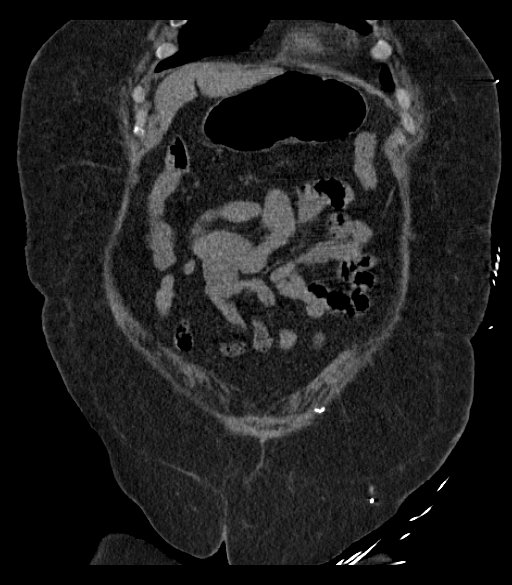
[im 91/182  soft-tissue]
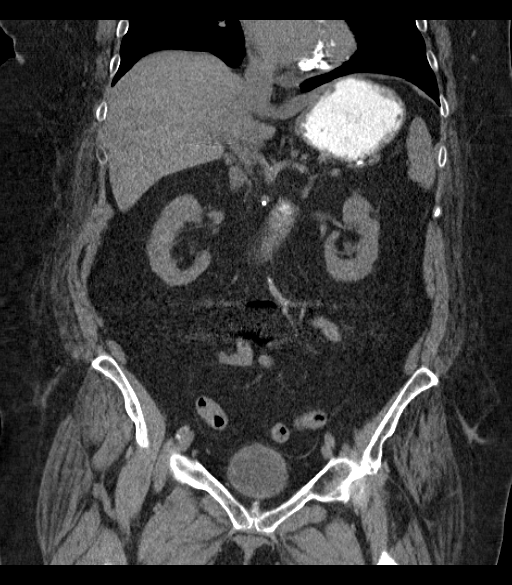
[im 136/182  soft-tissue]
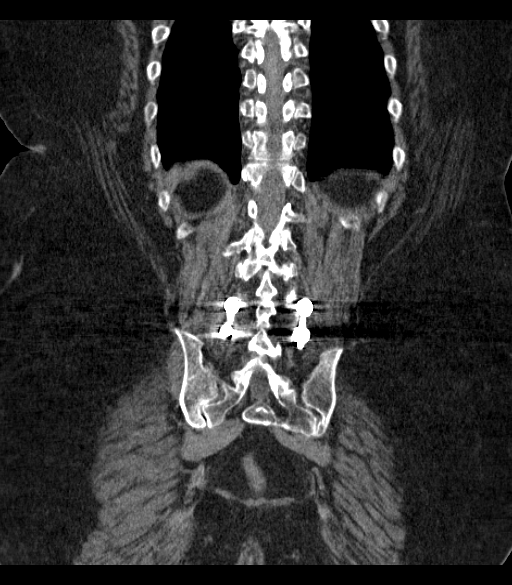

[14 of 46 positions shown; findings below may reference images not displayed]

FINDINGS: Lower chest and abdominal wall: Small fatty umbilical hernia. Lower
abdominal wall scarring

Bulky mitral annular calcification/ossification. Coronary
atherosclerotic calcification.

Hepatobiliary: No focal liver abnormality.Cholecystectomy with
normal common bile duct diameter.

Pancreas: Unremarkable.

Spleen: Unremarkable.

Adrenals/Urinary Tract: Negative adrenals. Punctate stone in the
lower pole left kidney, nonobstructive. No hydronephrosis or
ureteral calculus. Hazy 7 mm high-density area within the medullary
region of the interpolar right kidney, favor hemorrhagic cyst.
Mildly thick appearance of the bladder but underdistended. No
surrounding inflammatory changes.

Reproductive:Hysterectomy and probable oophorectomies. No adnexal
mass.

Stomach/Bowel:  No obstruction. No inflammation.  Appendectomy.

Vascular/Lymphatic: Extensive aortic and branch vessel
atherosclerosis, especially for age. No mass or adenopathy.

Peritoneal: No ascites or pneumoperitoneum.

Musculoskeletal: No acute finding. L4-5 discectomy with solid bony
fusion.
IMPRESSION: 1. No acute finding to explain left lower quadrant pain.
2. Small, nonobstructing left nephrolithiasis.

## 2017-10-24 IMAGING — CT CT HEAD W/O CM
3 of 4 series · 15 of 47 positions shown, 18 images · non-contrast
Comparison: November 23, 2015 CT scan of the brain

CLINICAL DATA: Increasing unresponsiveness.

EXAM:
CT HEAD WITHOUT CONTRAST
TECHNIQUE: Contiguous axial images were obtained from the base of the skull
through the vertex without intravenous contrast.

[Series 2: head wo · axial · 0.44mm/px · z∈[-209,-79]mm · 9 of 32 slices shown, 12 images]
[im 3/32  brain]
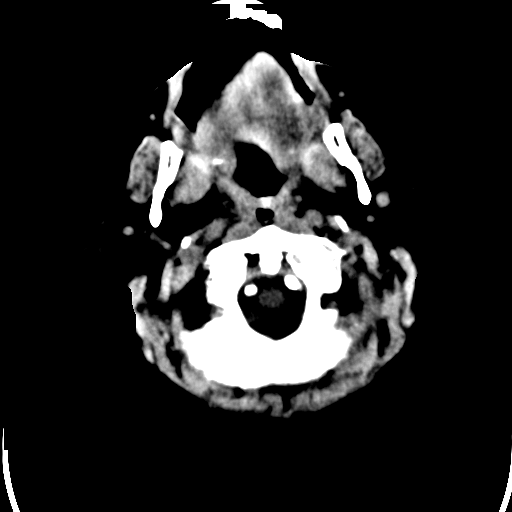
[im 3/32  bone]
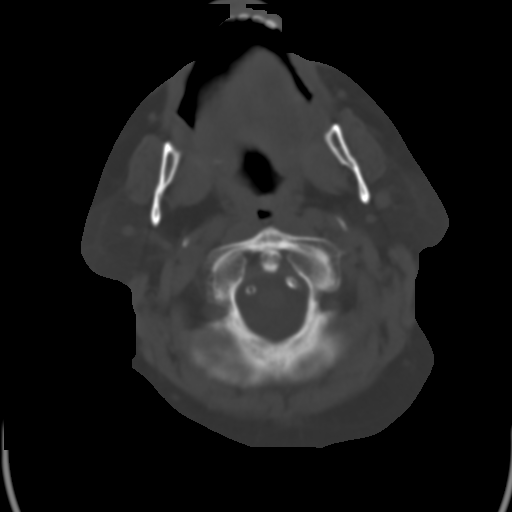
[im 7/32  brain]
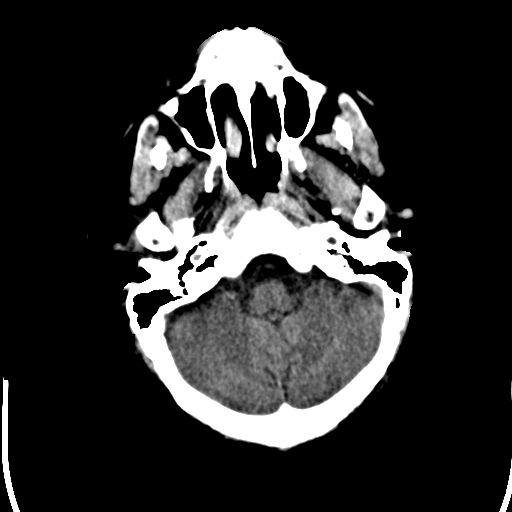
[im 9/32  brain]
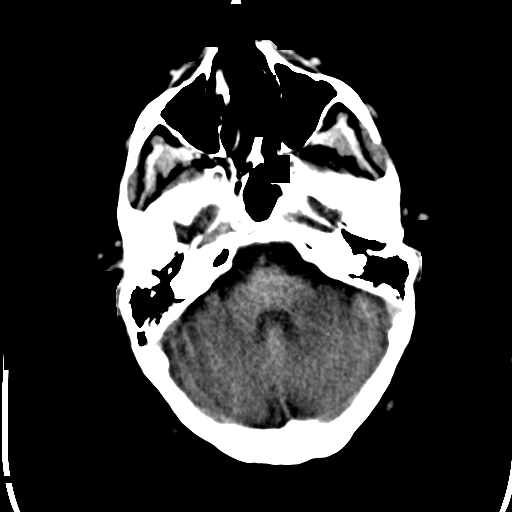
[im 14/32  brain]
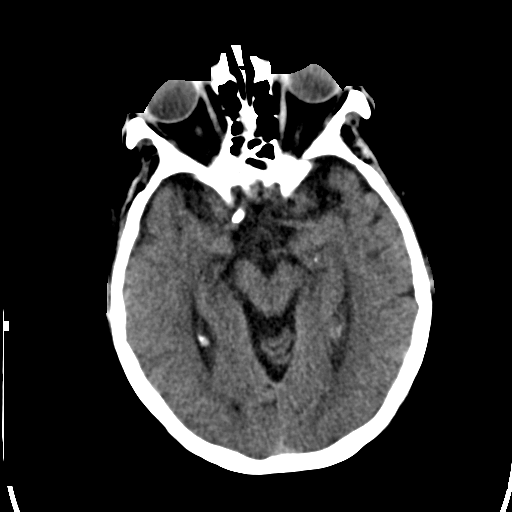
[im 16/32  brain]
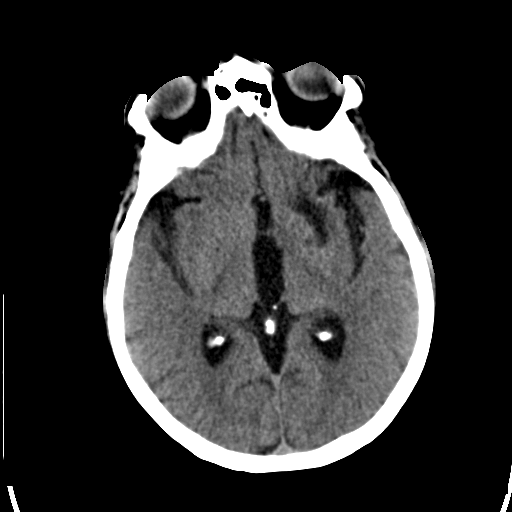
[im 16/32  bone]
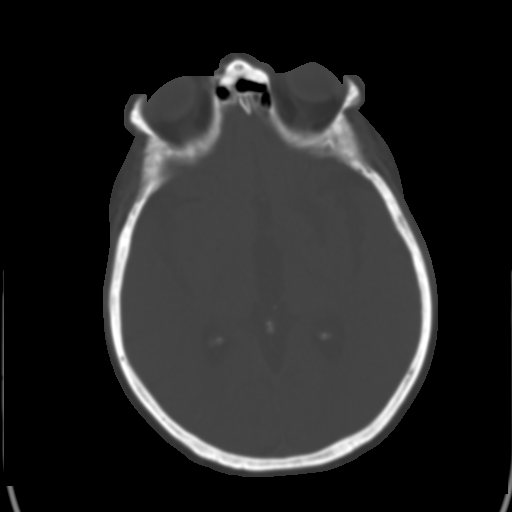
[im 18/32  brain]
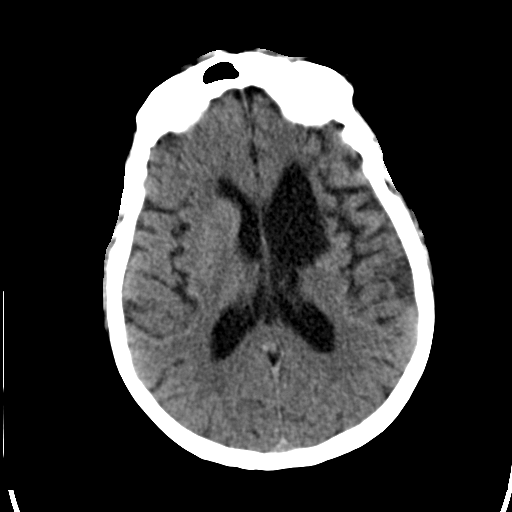
[im 23/32  brain]
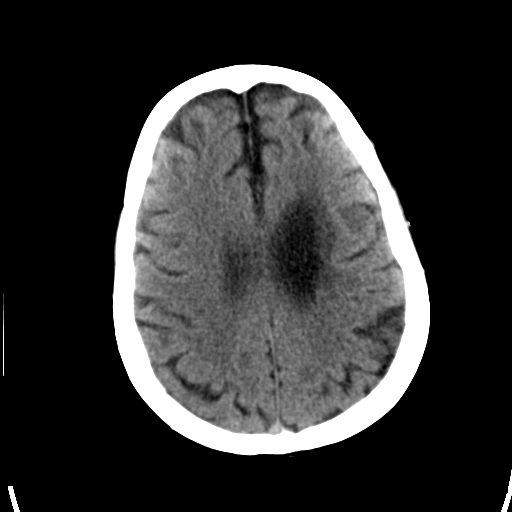
[im 25/32  brain]
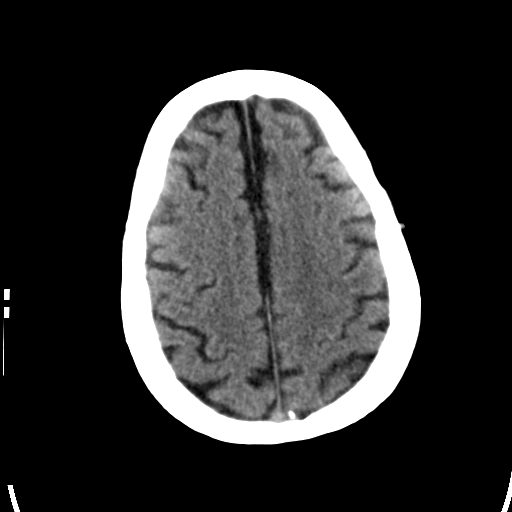
[im 29/32  brain]
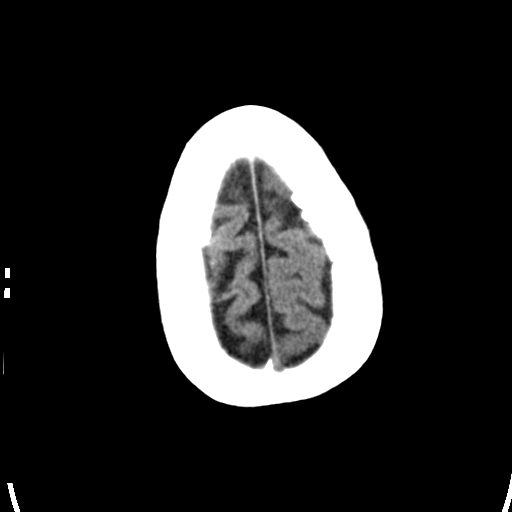
[im 29/32  bone]
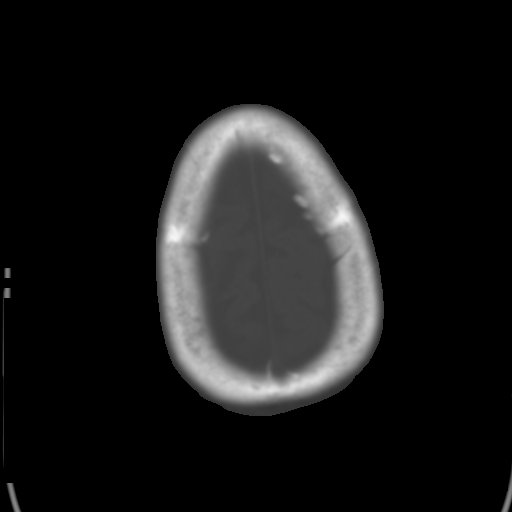

[Series 6: coronal soft tissue · coronal · 0.27mm/px · 3 of 65 slices shown]
[im 22/65  brain]
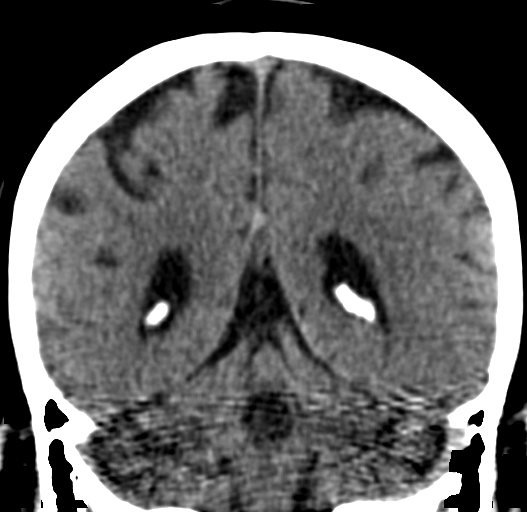
[im 29/65  brain]
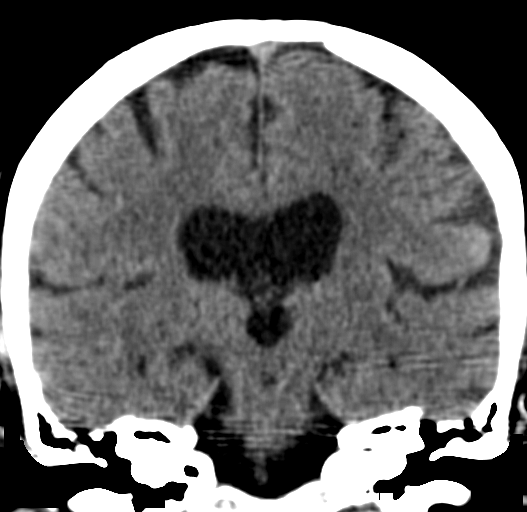
[im 36/65  brain]
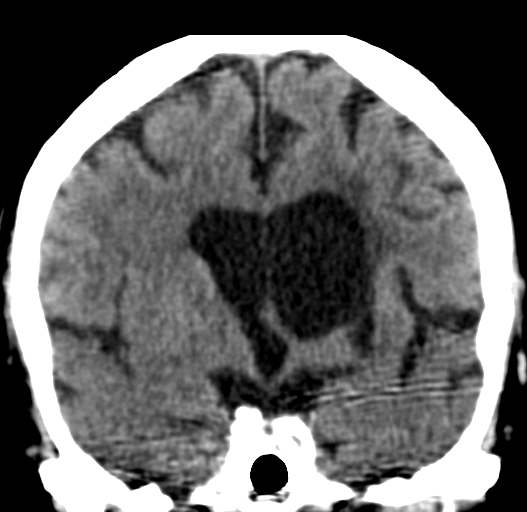

[Series 7: sagittal soft tissue · sagittal · 0.28mm/px · 3 of 49 slices shown]
[im 17/49  brain]
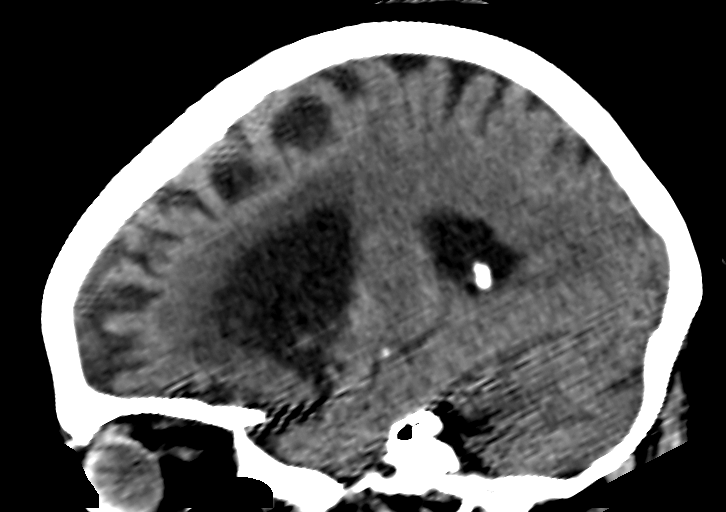
[im 25/49  brain]
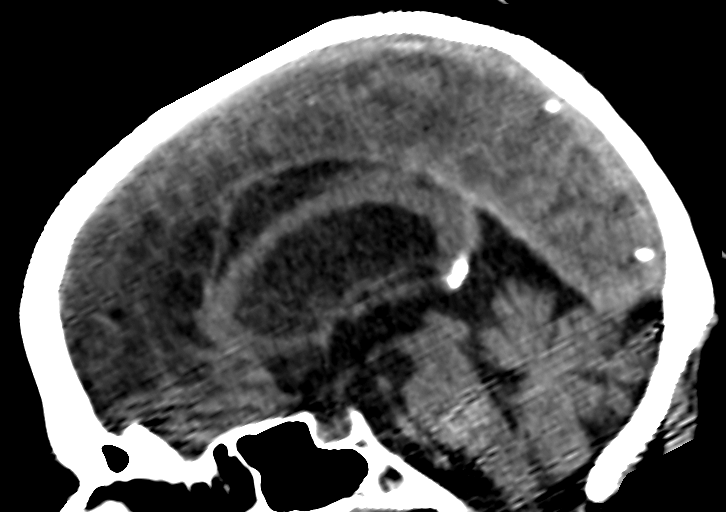
[im 33/49  brain]
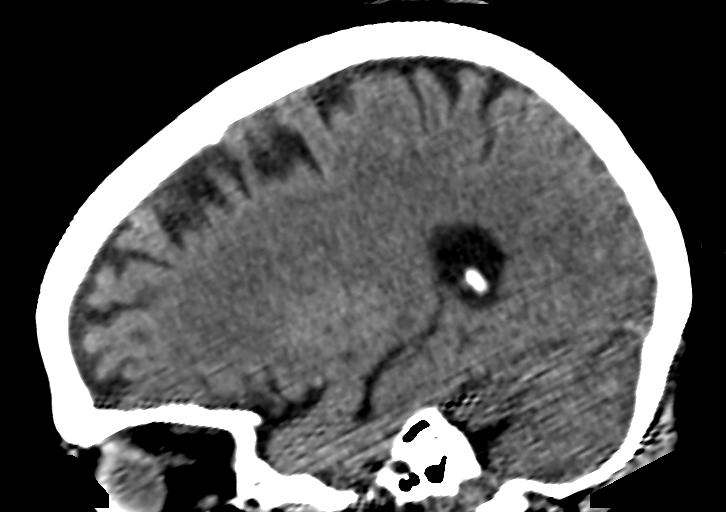

[15 of 47 positions shown; findings below may reference images not displayed]

FINDINGS: Paranasal sinuses, mastoid air cells, and middle ears are normal.
Bones and extracranial soft tissues are unremarkable. No mass, mass
effect, or midline shift. No subdural, epidural, or subarachnoid
hemorrhage. The cerebellum, brainstem, and basal cisterns are
normal. A remote left lenticulostriate infarct with ex vacuo
dilatation of the frontal horn of the left lateral ventricle is
stable. Scattered white matter changes are stable. No acute cortical
ischemia or infarct. No other acute abnormalities.
IMPRESSION: 1. No acute intracranial abnormalities identified.

## 2017-10-25 DIAGNOSIS — E119 Type 2 diabetes mellitus without complications: Secondary | ICD-10-CM | POA: Diagnosis not present

## 2017-10-25 DIAGNOSIS — B351 Tinea unguium: Secondary | ICD-10-CM | POA: Diagnosis not present

## 2017-10-26 IMAGING — MR MR HEAD WO/W CM
12 series · 48 of 48 positions shown · IV contrast (multihance)
Comparison: Head CTs without contrast 12/26/2015 and earlier. Brain
MRI 01/18/2014.

CLINICAL DATA: 59-year-old female with confusion. Initial
encounter.

EXAM:
MRI HEAD WITHOUT AND WITH CONTRAST
TECHNIQUE: Multiplanar, multiecho pulse sequences of the brain and surrounding
structures were obtained without and with intravenous contrast.
CONTRAST:  20mL MULTIHANCE GADOBENATE DIMEGLUMINE 529 MG/ML IV SOLN

[Series 2: T1 · sagittal · 5.0mm · 0.45mm/px · 3 of 29 slices shown (1 of 2)]
[im 1/29]
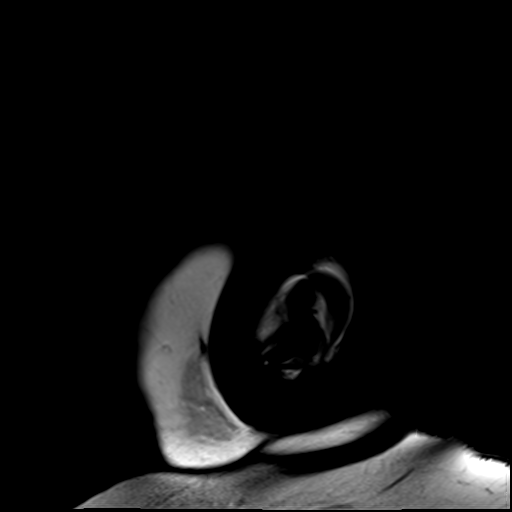
[im 15/29]
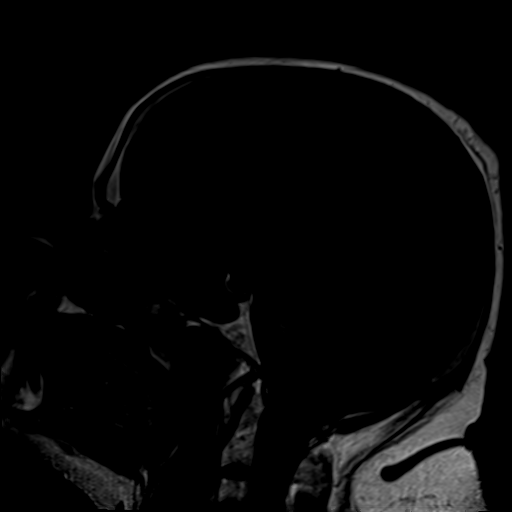
[im 29/29]
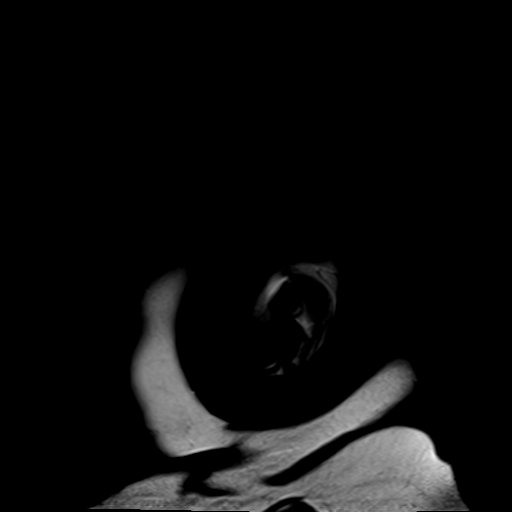

[Series 4: DWI · axial · 3.0mm · 1.80mm/px · z∈[-33,+122]mm · 5 of 55 slices shown (1 of 3)]
[im 1/55]
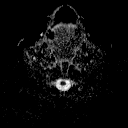
[im 14/55]
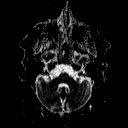
[im 28/55]
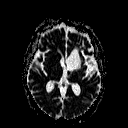
[im 41/55]
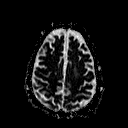
[im 55/55]
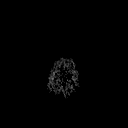

[Series 6: DWI · coronal · 3.0mm · 1.80mm/px · 4 of 45 slices shown (2 of 3)]
[im 1/45]
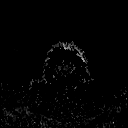
[im 15/45]
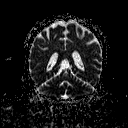
[im 30/45]
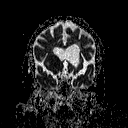
[im 45/45]
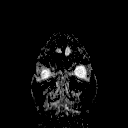

[Series 7: T2 · axial · 5.0mm · 0.60mm/px · z∈[-36,+125]mm · 3 of 27 slices shown (1 of 2)]
[im 1/27]
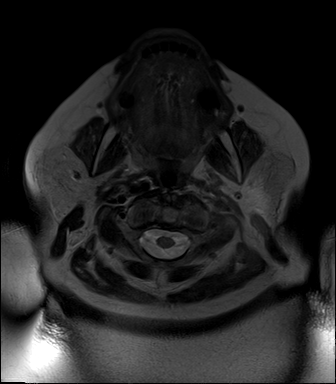
[im 14/27]
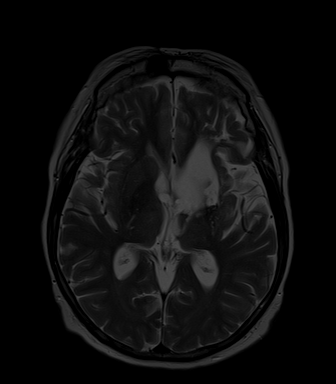
[im 27/27]
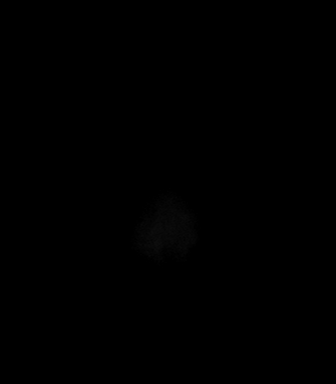

[Series 8: FLAIR · axial · 5.0mm · 0.45mm/px · z∈[-36,+125]mm · 3 of 27 slices shown]
[im 1/27]
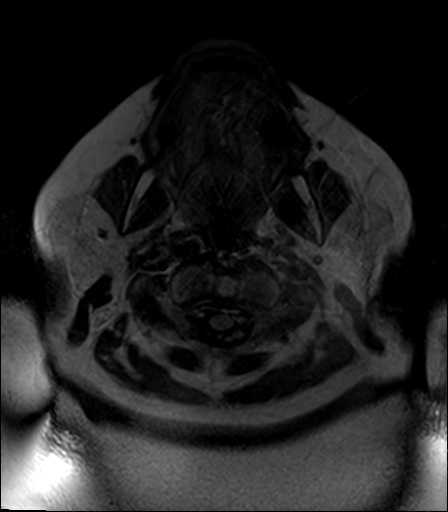
[im 14/27]
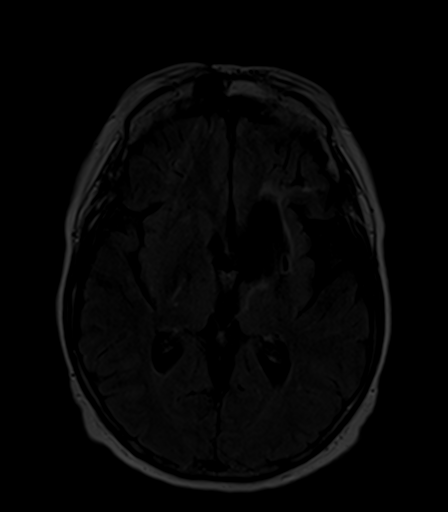
[im 27/27]
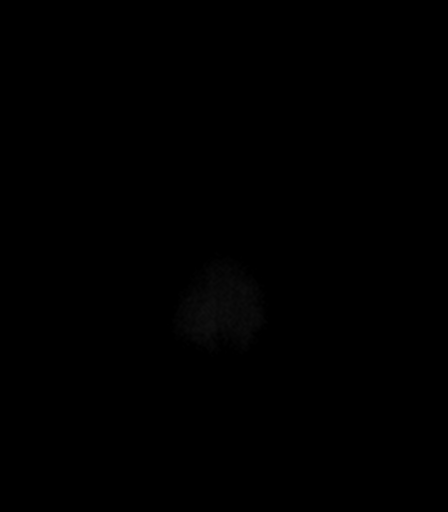

[Series 9: T2 · axial · 5.0mm · 0.45mm/px · z∈[-36,+125]mm · 3 of 27 slices shown (2 of 2)]
[im 1/27]
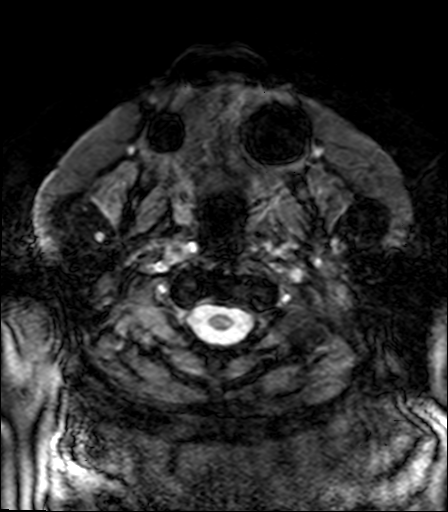
[im 14/27]
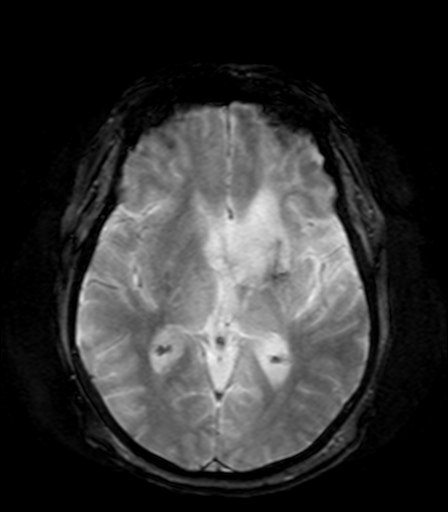
[im 27/27]
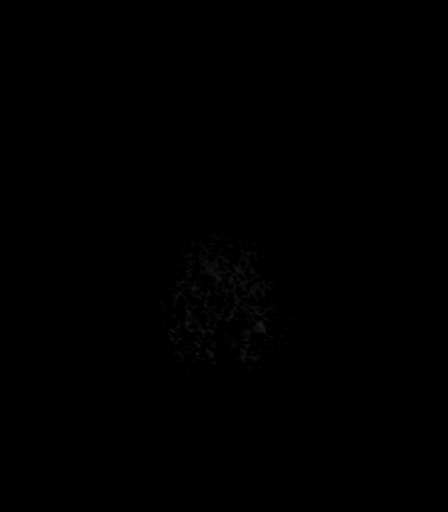

[Series 10: T1 · axial · 3.0mm · 1.00mm/px · z∈[-36,+133]mm · 6 of 60 slices shown (2 of 2)]
[im 1/60]
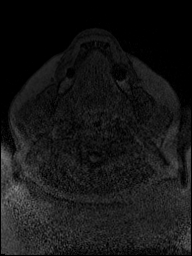
[im 12/60]
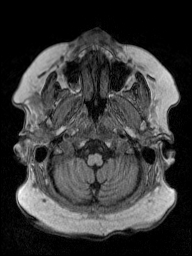
[im 24/60]
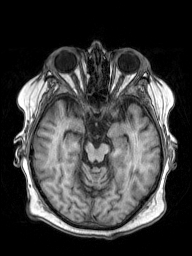
[im 36/60]
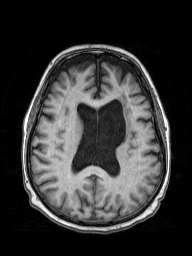
[im 48/60]
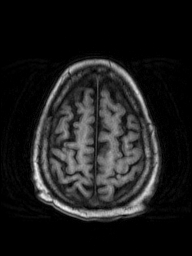
[im 60/60]
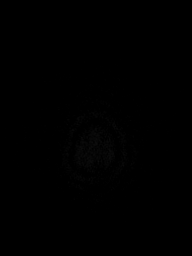

[Series 11: T2 post-contrast · coronal · 5.0mm · 0.49mm/px · 3 of 27 slices shown]
[im 1/27]
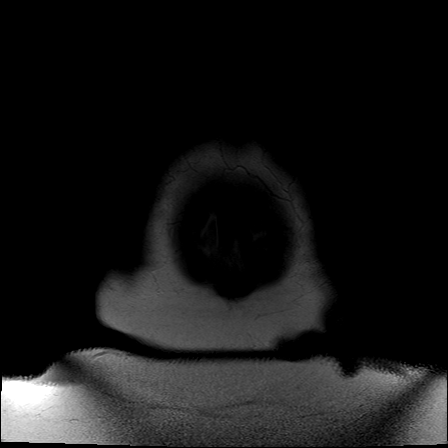
[im 14/27]
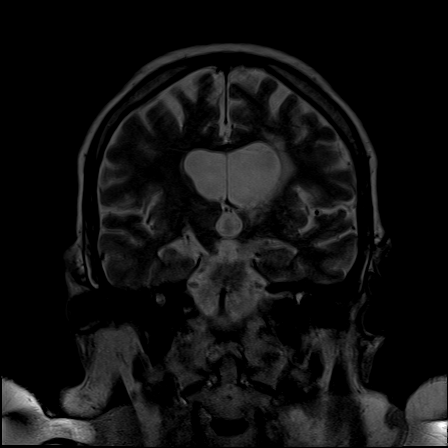
[im 27/27]
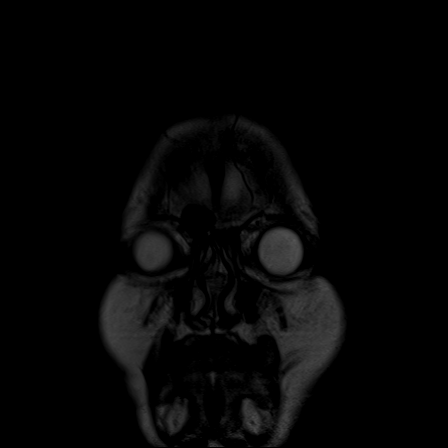

[Series 12: T1 post-contrast · axial · 3.0mm · 1.00mm/px · z∈[-36,+133]mm · 6 of 60 slices shown (1 of 2)]
[im 1/60]
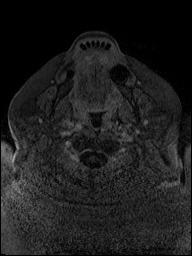
[im 12/60]
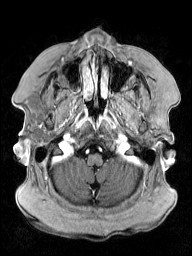
[im 24/60]
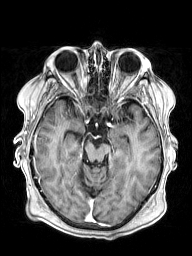
[im 36/60]
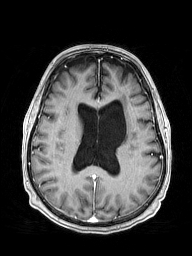
[im 48/60]
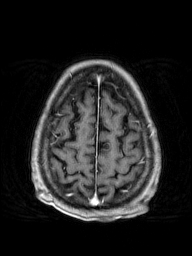
[im 60/60]
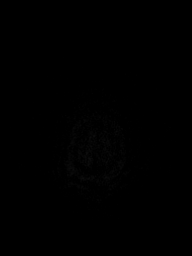

[Series 13: T1 post-contrast · coronal · 5.0mm · 0.43mm/px · 3 of 27 slices shown (2 of 2)]
[im 1/27]
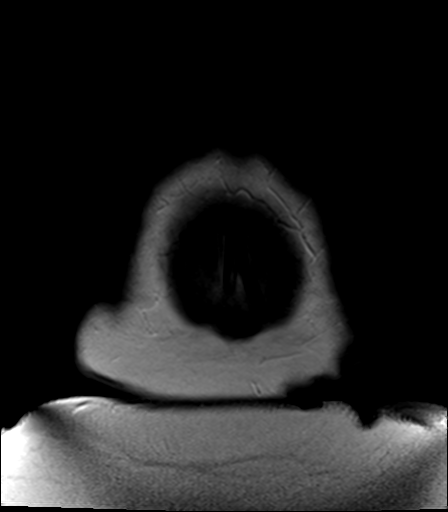
[im 14/27]
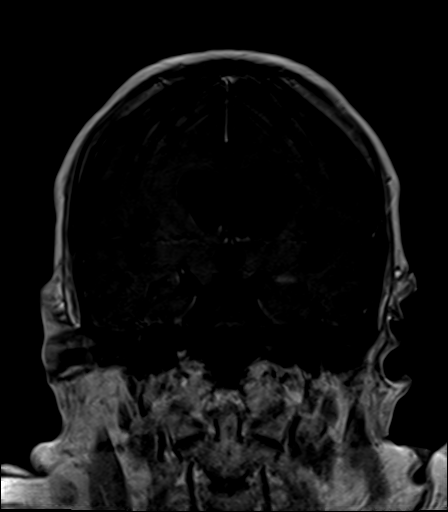
[im 27/27]
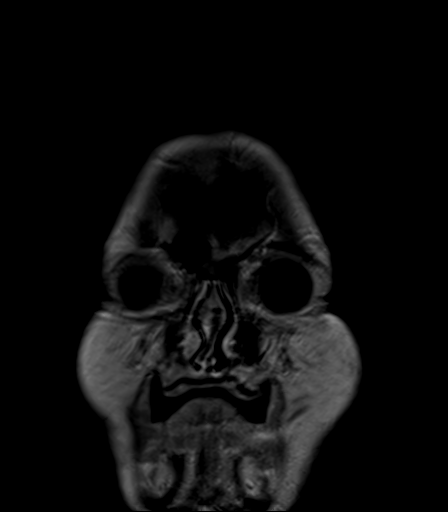

[Series 100: DWI · axial · 3.0mm · 1.80mm/px · z∈[-33,+122]mm · 5 of 55 slices shown (3 of 3)]
[im 1/55]
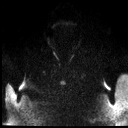
[im 14/55]
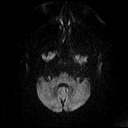
[im 28/55]
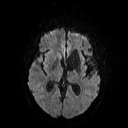
[im 41/55]
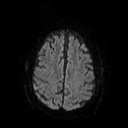
[im 55/55]
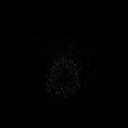

[Series 101: (id) cor · coronal · 3.0mm · 1.80mm/px · 4 of 44 slices shown]
[im 1/44]
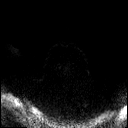
[im 15/44]
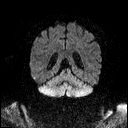
[im 29/44]
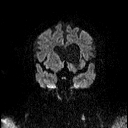
[im 44/44]
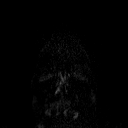

[48 of 48 positions shown; findings below may reference images not displayed]

FINDINGS: Major intracranial vascular flow voids are stable. No restricted
diffusion to suggest acute infarction. No midline shift, mass
effect, evidence of mass lesion, ventriculomegaly, extra-axial
collection or acute intracranial hemorrhage. Cervicomedullary
junction and pituitary are within normal limits.

Chronic encephalomalacia in the left MCA territory with ex vacuo
enlargement of the left frontal horn. Mild associated hemosiderin at
the level of the left basal ganglia. No associated cortical
encephalomalacia. Superimposed chronic lacunar infarct of the
posterior limb right internal capsule tracking into the right
midbrain. Associated right greater than left Wallerian degeneration
in the brainstem. Superimposed mild T2 heterogeneity elsewhere in
the deep gray matter nuclei. Overall stable gray and white matter
signal since 2500. No other chronic cerebral blood products.
Cerebellum remains normal. No abnormal enhancement identified. No
dural thickening.

Visible internal auditory structures appear normal. Mastoids remain
clear. Minor paranasal sinus mucosal thickening is stable.
Postoperative changes to both globes. Negative orbit and scalp soft
tissues otherwise. Normal bone marrow signal.
IMPRESSION: 1.  No acute intracranial abnormality.
2. Stable MRI appearance the brain since 2500 with chronic left deep
gray matter and right internal capsule infarcts with Wallerian
degeneration and ex vacuo left ventricle enlargement.

## 2017-12-04 ENCOUNTER — Encounter: Payer: Self-pay | Admitting: Family Medicine

## 2017-12-04 ENCOUNTER — Ambulatory Visit (INDEPENDENT_AMBULATORY_CARE_PROVIDER_SITE_OTHER): Payer: PPO | Admitting: Family Medicine

## 2017-12-04 VITALS — BP 162/74 | HR 70 | Temp 98.9°F | Resp 16 | Ht 61.0 in | Wt 236.0 lb

## 2017-12-04 DIAGNOSIS — Z794 Long term (current) use of insulin: Secondary | ICD-10-CM | POA: Diagnosis not present

## 2017-12-04 DIAGNOSIS — I10 Essential (primary) hypertension: Secondary | ICD-10-CM

## 2017-12-04 DIAGNOSIS — Z6841 Body Mass Index (BMI) 40.0 and over, adult: Secondary | ICD-10-CM

## 2017-12-04 DIAGNOSIS — G459 Transient cerebral ischemic attack, unspecified: Secondary | ICD-10-CM | POA: Diagnosis not present

## 2017-12-04 DIAGNOSIS — E118 Type 2 diabetes mellitus with unspecified complications: Secondary | ICD-10-CM

## 2017-12-04 NOTE — Progress Notes (Signed)
Patient: Susan House Female    DOB: Dec 18, 1955   62 y.o.   MRN: [id number] Visit Date: 12/04/2017  Today's Provider: Wilhemena Durie, MD   Chief Complaint  Patient presents with  . Hypertension  . Diabetes  . URI   Subjective:    HPI Patient comes in today to follow up on Hypertension. She was last seen in the office about 1 month ago (saw Dr. Brita Romp) due to her BP being too high. She reports that she was started on clonidine 0.38m BID. She denies any side effects to the medication. However, she reports that she is still getting high BP readings at home.   BP Readings from Last 3 Encounters:  12/04/17 (!) 162/74  10/19/17 (!) 176/82  10/10/17 (!) 169/74    Diabetes Mellitus Type II, Follow-up:   Lab Results  Component Value Date   HGBA1C 7.8 (H) 10/04/2017   HGBA1C 8.4 09/06/2017   HGBA1C 7.3 05/07/2017    Last seen for diabetes 2 months ago.  Management since then includes no changes. She reports good compliance with treatment. She is not having side effects.  Home blood sugar records: trend: fluctuating a bit  Episodes of hypoglycemia? no   Pertinent Labs:    Component Value Date/Time   CHOL 97 10/04/2017 0458   CHOL 110 01/18/2014 0059   TRIG 51 10/04/2017 0458   TRIG 122 01/18/2014 0059   HDL 48 10/04/2017 0458   HDL 35 (L) 01/18/2014 0059   LDLCALC 39 10/04/2017 0458   LDLCALC 51 01/18/2014 0059   CREATININE 0.95 10/03/2017 1734   CREATININE 1.29 06/17/2014 0744    Wt Readings from Last 3 Encounters:  12/04/17 236 lb (107 kg)  10/10/17 232 lb (105.2 kg)  10/03/17 244 lb 3.2 oz (110.8 kg)    Patient also mentions that she has had a cough for the last 3 days. She has not taken anything OTC for her symptoms.     Allergies  Allergen Reactions  . Morphine And Related Anaphylaxis  . Fentanyl Other (See Comments)    Unknown reaction.  . Reglan [Metoclopramide]     Other reaction(s): Unknown Elevated BP  . Simvastatin Other  (See Comments)    Muscle pain  . Betadine [Povidone Iodine] Rash  . Iodine Rash    Other reaction(s): Unknown  . Tetracyclines & Related Rash    Other reaction(s): Unknown     Current Outpatient Medications:  .  ACCU-CHEK AVIVA PLUS test strip, TEST BLOOD SUGAR THREE TIMES DAILY, Disp: 300 each, Rfl: 3 .  ACCU-CHEK SOFTCLIX LANCETS lancets, CHECK BLOOD SUGAR THREE TIMES DAILY, Disp: 300 each, Rfl: 3 .  acyclovir ointment (ZOVIRAX) 5 %, Apply 1 application topically every 3 (three) hours., Disp: 5 g, Rfl: 1 .  amLODipine (NORVASC) 10 MG tablet, Take 1 tablet (10 mg total) by mouth daily., Disp: 90 tablet, Rfl: 3 .  atorvastatin (LIPITOR) 20 MG tablet, Take 20 mg by mouth daily., Disp: , Rfl:  .  Blood Glucose Monitoring Suppl (ACCU-CHEK AVIVA PLUS) w/Device KIT, Check sugar three times daily DX E11.9-needs a meter, Disp: 1 kit, Rfl: 0 .  cloNIDine (CATAPRES) 0.1 MG tablet, Take 1 tablet (0.1 mg total) by mouth 2 (two) times daily., Disp: 60 tablet, Rfl: 3 .  clopidogrel (PLAVIX) 75 MG tablet, Take 1 tablet (75 mg total) by mouth daily., Disp: 90 tablet, Rfl: 3 .  diphenoxylate-atropine (LOMOTIL) 2.5-0.025 MG tablet, 2 tablets initially and  then 1 tablet every 6 hours as needed for diarrhea (Patient not taking: Reported on 10/19/2017), Disp: 60 tablet, Rfl: 1 .  enalapril (VASOTEC) 20 MG tablet, Take 1 tablet (20 mg total) by mouth 2 (two) times daily., Disp: 180 tablet, Rfl: 3 .  hydrALAZINE (APRESOLINE) 100 MG tablet, Take 1 tablet (100 mg total) by mouth 2 (two) times daily., Disp: 60 tablet, Rfl: 3 .  HYDROcodone-acetaminophen (NORCO) 10-325 MG tablet, Take 1-2 tablets by mouth every 6 (six) hours as needed., Disp: 60 tablet, Rfl: 0 .  insulin aspart protamine- aspart (NOVOLOG MIX 70/30) (70-30) 100 UNIT/ML injection, Inject 10-35 Units into the skin 2 (two) times daily. , Disp: , Rfl:  .  Lancet Devices (ACCU-CHEK SOFTCLIX) lancets, Check sugar three times daily DX E11.9, Disp: 100 each,  Rfl: 3 .  levETIRAcetam (KEPPRA) 500 MG tablet, Take 1 tablet (500 mg total) by mouth 2 (two) times daily. 500 mg bid., Disp: 180 tablet, Rfl: 3 .  metFORMIN (GLUCOPHAGE) 1000 MG tablet, Take 1 tablet (1,000 mg total) by mouth 2 (two) times daily., Disp: 180 tablet, Rfl: 3 .  metoprolol succinate (TOPROL-XL) 100 MG 24 hr tablet, Take 1 tablet (100 mg total) by mouth daily., Disp: 90 tablet, Rfl: 3 .  nitroGLYCERIN (NITROSTAT) 0.4 MG SL tablet, Place 1 tablet (0.4 mg total) under the tongue every 5 (five) minutes as needed for chest pain., Disp: 25 tablet, Rfl: 0 .  oxybutynin (DITROPAN) 5 MG tablet, Take 1 tablet (5 mg total) by mouth 2 (two) times daily., Disp: 180 tablet, Rfl: 3 .  pantoprazole (PROTONIX) 40 MG tablet, Take 1 tablet (40 mg total) by mouth daily., Disp: 90 tablet, Rfl: 3 .  triamcinolone cream (KENALOG) 0.1 %, Can use thin layer on affected areas 2x daily for one week at a time. Not for face., Disp: 30 g, Rfl: 0  Review of Systems  Constitutional: Negative for activity change, appetite change and fatigue.  HENT: Negative.   Eyes: Negative.   Respiratory: Negative.   Cardiovascular: Negative for chest pain, palpitations and leg swelling.  Endocrine: Negative.   Allergic/Immunologic: Negative.   Neurological: Negative for headaches.  Psychiatric/Behavioral: Negative.     Social History   Tobacco Use  . Smoking status: Never Smoker  . Smokeless tobacco: Never Used  Substance Use Topics  . Alcohol use: No   Objective:   BP (!) 162/74 (BP Location: Left Arm, Patient Position: Sitting, Cuff Size: Normal)   Pulse 70   Temp 98.9 F (37.2 C)   Resp 16   Ht 5' 1"  (1.549 m)   Wt 236 lb (107 kg)   SpO2 97%   BMI 44.59 kg/m  Vitals:   12/04/17 1108  BP: (!) 162/74  Pulse: 70  Resp: 16  Temp: 98.9 F (37.2 C)  SpO2: 97%  Weight: 236 lb (107 kg)  Height: 5' 1"  (1.549 m)     Physical Exam      Assessment & Plan:     HTN Increase Clonidine to 0.2 mg  BID. RTC 1 month Obesity TIIDM H/o CVA  I have done the exam and reviewed the chart and it is accurate to the best of my knowledge. Development worker, community has been used and  any errors in dictation or transcription are unintentional. Miguel Aschoff M.D. [place], MD  [facility]

## 2017-12-04 NOTE — Patient Instructions (Signed)
Increase Clonidine to 0.2mg  BID.

## 2017-12-05 ENCOUNTER — Other Ambulatory Visit: Payer: Self-pay | Admitting: Family Medicine

## 2017-12-05 MED ORDER — GLUCOSE BLOOD VI STRP: ORAL_STRIP | 3 refills | Status: DC

## 2017-12-05 MED ORDER — HYDROCODONE-ACETAMINOPHEN 10-325 MG PO TABS: 1.0000 | ORAL_TABLET | Freq: Four times a day (QID) | ORAL | 0 refills | Status: DC | PRN

## 2017-12-05 NOTE — Telephone Encounter (Signed)
Pt contacted office for refill request on the following medications:  HYDROcodone-acetaminophen (NORCO) 10-325 MG tablet   Susan House  Last Rx: 10/01/17 LOV: 12/04/17 Please advise. Thanks TNP

## 2017-12-05 NOTE — Telephone Encounter (Signed)
Please review. Thanks!  

## 2017-12-05 NOTE — Telephone Encounter (Signed)
Pt contacted office for refill request on the following medications:  Mobile test strip  [place]  Please advise. Thanks TNP

## 2017-12-11 ENCOUNTER — Ambulatory Visit (INDEPENDENT_AMBULATORY_CARE_PROVIDER_SITE_OTHER): Payer: PPO | Admitting: Family Medicine

## 2017-12-11 VITALS — BP 138/60 | HR 72 | Temp 98.2°F

## 2017-12-11 DIAGNOSIS — E1122 Type 2 diabetes mellitus with diabetic chronic kidney disease: Secondary | ICD-10-CM

## 2017-12-11 DIAGNOSIS — N183 Chronic kidney disease, stage 3 (moderate): Secondary | ICD-10-CM | POA: Diagnosis not present

## 2017-12-11 DIAGNOSIS — J329 Chronic sinusitis, unspecified: Secondary | ICD-10-CM | POA: Diagnosis not present

## 2017-12-11 MED ORDER — AMOXICILLIN-POT CLAVULANATE 875-125 MG PO TABS: 1.0000 | ORAL_TABLET | Freq: Two times a day (BID) | ORAL | 0 refills | Status: DC
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
## 2017-12-25 ENCOUNTER — Ambulatory Visit (INDEPENDENT_AMBULATORY_CARE_PROVIDER_SITE_OTHER): Payer: PPO | Admitting: Family Medicine

## 2017-12-25 VITALS — BP 138/78 | Temp 98.9°F | Resp 16 | Wt 214.0 lb

## 2017-12-25 DIAGNOSIS — K529 Noninfective gastroenteritis and colitis, unspecified: Secondary | ICD-10-CM

## 2017-12-25 DIAGNOSIS — Z6841 Body Mass Index (BMI) 40.0 and over, adult: Secondary | ICD-10-CM | POA: Diagnosis not present

## 2017-12-25 DIAGNOSIS — I6389 Other cerebral infarction: Secondary | ICD-10-CM

## 2017-12-25 DIAGNOSIS — R634 Abnormal weight loss: Secondary | ICD-10-CM | POA: Diagnosis not present

## 2017-12-25 DIAGNOSIS — I69993 Ataxia following unspecified cerebrovascular disease: Secondary | ICD-10-CM | POA: Diagnosis not present

## 2017-12-25 DIAGNOSIS — R1084 Generalized abdominal pain: Secondary | ICD-10-CM | POA: Diagnosis not present

## 2017-12-25 MED ORDER — DIPHENOXYLATE-ATROPINE 2.5-0.025 MG PO TABS: ORAL_TABLET | ORAL | 1 refills | Status: DC

## 2017-12-25 NOTE — Progress Notes (Signed)
Patient: Susan House Female    DOB: 13-Oct-1955   62 y.o.   MRN: [id number] Visit Date: 12/25/2017  Today's Provider: Wilhemena Durie, MD   Chief Complaint  Patient presents with  . Diarrhea   Subjective:    Diarrhea   This is a new problem. The current episode started in the past 7 days (about 4 days). The problem occurs 2 to 4 times per day. The problem has been unchanged. The stool consistency is described as watery. Associated symptoms include chills, myalgias, sweats and weight loss. Pertinent negatives include no fever or vomiting. Nothing aggravates the symptoms. There are no known risk factors. She has tried increased fluids for the symptoms. The treatment provided no relief.  She has city water. No recent antibiotics. Pt has a history of colitis.No blood in stool nor fevers.     Allergies  Allergen Reactions  . Morphine And Related Anaphylaxis  . Fentanyl Other (See Comments)    Unknown reaction.  . Reglan [Metoclopramide]     Other reaction(s): Unknown Elevated BP  . Simvastatin Other (See Comments)    Muscle pain  . Betadine [Povidone Iodine] Rash  . Iodine Rash    Other reaction(s): Unknown  . Tetracyclines & Related Rash    Other reaction(s): Unknown     Current Outpatient Medications:  .  ACCU-CHEK SOFTCLIX LANCETS lancets, CHECK BLOOD SUGAR THREE TIMES DAILY, Disp: 300 each, Rfl: 3 .  acyclovir ointment (ZOVIRAX) 5 %, Apply 1 application topically every 3 (three) hours., Disp: 5 g, Rfl: 1 .  amLODipine (NORVASC) 10 MG tablet, Take 1 tablet (10 mg total) by mouth daily., Disp: 90 tablet, Rfl: 3 .  atorvastatin (LIPITOR) 20 MG tablet, Take 20 mg by mouth daily., Disp: , Rfl:  .  Blood Glucose Monitoring Suppl (ACCU-CHEK AVIVA PLUS) w/Device KIT, Check sugar three times daily DX E11.9-needs a meter, Disp: 1 kit, Rfl: 0 .  cloNIDine (CATAPRES) 0.1 MG tablet, Take 1 tablet (0.1 mg total) by mouth 2 (two) times daily., Disp: 60 tablet, Rfl: 3 .   clopidogrel (PLAVIX) 75 MG tablet, Take 1 tablet (75 mg total) by mouth daily., Disp: 90 tablet, Rfl: 3 .  diphenoxylate-atropine (LOMOTIL) 2.5-0.025 MG tablet, 2 tablets initially and then 1 tablet every 6 hours as needed for diarrhea, Disp: 60 tablet, Rfl: 1 .  enalapril (VASOTEC) 20 MG tablet, Take 1 tablet (20 mg total) by mouth 2 (two) times daily., Disp: 180 tablet, Rfl: 3 .  glucose blood (ACCU-CHEK AVIVA PLUS) test strip, TEST BLOOD SUGAR THREE TIMES DAILY, Disp: 300 each, Rfl: 3 .  hydrALAZINE (APRESOLINE) 100 MG tablet, Take 1 tablet (100 mg total) by mouth 2 (two) times daily., Disp: 60 tablet, Rfl: 3 .  HYDROcodone-acetaminophen (NORCO) 10-325 MG tablet, Take 1-2 tablets by mouth every 6 (six) hours as needed., Disp: 60 tablet, Rfl: 0 .  insulin aspart protamine- aspart (NOVOLOG MIX 70/30) (70-30) 100 UNIT/ML injection, Inject 10-35 Units into the skin 2 (two) times daily. , Disp: , Rfl:  .  Lancet Devices (ACCU-CHEK SOFTCLIX) lancets, Check sugar three times daily DX E11.9, Disp: 100 each, Rfl: 3 .  levETIRAcetam (KEPPRA) 500 MG tablet, Take 1 tablet (500 mg total) by mouth 2 (two) times daily. 500 mg bid., Disp: 180 tablet, Rfl: 3 .  metFORMIN (GLUCOPHAGE) 1000 MG tablet, Take 1 tablet (1,000 mg total) by mouth 2 (two) times daily., Disp: 180 tablet, Rfl: 3 .  metoprolol succinate (  TOPROL-XL) 100 MG 24 hr tablet, Take 1 tablet (100 mg total) by mouth daily., Disp: 90 tablet, Rfl: 3 .  nitroGLYCERIN (NITROSTAT) 0.4 MG SL tablet, Place 1 tablet (0.4 mg total) under the tongue every 5 (five) minutes as needed for chest pain., Disp: 25 tablet, Rfl: 0 .  oxybutynin (DITROPAN) 5 MG tablet, Take 1 tablet (5 mg total) by mouth 2 (two) times daily., Disp: 180 tablet, Rfl: 3 .  pantoprazole (PROTONIX) 40 MG tablet, Take 1 tablet (40 mg total) by mouth daily., Disp: 90 tablet, Rfl: 3 .  triamcinolone cream (KENALOG) 0.1 %, Can use thin layer on affected areas 2x daily for one week at a time. Not for  face., Disp: 30 g, Rfl: 0 .  amoxicillin-clavulanate (AUGMENTIN) 875-125 MG tablet, Take 1 tablet by mouth 2 (two) times daily. (Patient not taking: Reported on 12/25/2017), Disp: 20 tablet, Rfl: 0  Review of Systems  Constitutional: Positive for chills and weight loss. Negative for fever.  Gastrointestinal: Positive for diarrhea. Negative for vomiting.  Musculoskeletal: Positive for myalgias.    Social History   Tobacco Use  . Smoking status: Never Smoker  . Smokeless tobacco: Never Used  Substance Use Topics  . Alcohol use: No   Objective:   BP 138/78 (BP Location: Right Arm, Patient Position: Sitting, Cuff Size: Large)   Temp 98.9 F (37.2 C)   Resp 16   Wt 214 lb (97.1 kg)   BMI 40.43 kg/m  Vitals:   12/25/17 1506  BP: 138/78  Resp: 16  Temp: 98.9 F (37.2 C)  Weight: 214 lb (97.1 kg)     Physical Exam  Constitutional: She is oriented to person, place, and time. She appears well-developed and well-nourished.  Obese WF NAD.  HENT:  Head: Normocephalic and atraumatic.  Eyes: Conjunctivae are normal. No scleral icterus.  Neck: No thyromegaly present.  Cardiovascular: Normal rate, regular rhythm and normal heart sounds.  Pulmonary/Chest: Effort normal and breath sounds normal.  Abdominal: Soft. Bowel sounds are normal. There is no tenderness.  Musculoskeletal: She exhibits no edema.  Neurological: She is alert and oriented to person, place, and time.  Chronic left side weakness.  Skin: Skin is warm and dry.  Psychiatric: She has a normal mood and affect. Her behavior is normal. Judgment and thought content normal.        Assessment & Plan:     1. Generalized abdominal pain/colitis  - CBC with Differential/Platelet - Comprehensive metabolic panel - TSH - Cdiff NAA+O+P+Stool Culture; Future - Lipase - Ambulatory referral to Gastroenterology - diphenoxylate-atropine (LOMOTIL) 2.5-0.025 MG tablet; 2 tablets initially and then 1 tablet every 6 hours as needed  for diarrhea  Dispense: 60 tablet; Refill: 1 - Cdiff NAA+O+P+Stool Culture  2. Weight loss  - CBC with Differential/Platelet - Comprehensive metabolic panel - TSH - Cdiff NAA+O+P+Stool Culture; Future - Lipase - Ambulatory referral to Gastroenterology - diphenoxylate-atropine (LOMOTIL) 2.5-0.025 MG tablet; 2 tablets initially and then 1 tablet every 6 hours as needed for diarrhea  Dispense: 60 tablet; Refill: 1 - Cdiff NAA+O+P+Stool Culture 3.Morbid obesity 4.TIIDM 5.CVA with left hemiparesis     I have done the exam and reviewed the above chart and it is accurate to the best of my knowledge. Development worker, community has been used in this note in any air is in the dictation or transcription are unintentional.  Wilhemena Durie, MD  [place]

## 2017-12-26 LAB — CBC WITH DIFFERENTIAL/PLATELET
Basophils Absolute: 0 10*3/uL (ref 0.0–0.2)
Basos: 0 %
EOS (ABSOLUTE): 0.2 10*3/uL (ref 0.0–0.4)
Eos: 3 %
Hematocrit: 38.2 % (ref 34.0–46.6)
Hemoglobin: 12.4 g/dL (ref 11.1–15.9)
IMMATURE GRANS (ABS): 0 10*3/uL (ref 0.0–0.1)
IMMATURE GRANULOCYTES: 0 %
LYMPHS: 25 %
Lymphocytes Absolute: 1.7 10*3/uL (ref 0.7–3.1)
MCH: 28.3 pg (ref 26.6–33.0)
MCHC: 32.5 g/dL (ref 31.5–35.7)
MCV: 87 fL (ref 79–97)
MONOS ABS: 0.7 10*3/uL (ref 0.1–0.9)
Monocytes: 10 %
NEUTROS PCT: 62 %
Neutrophils Absolute: 4.2 10*3/uL (ref 1.4–7.0)
PLATELETS: 301 10*3/uL (ref 150–450)
RBC: 4.38 x10E6/uL (ref 3.77–5.28)
RDW: 13.7 % (ref 12.3–15.4)
WBC: 6.8 10*3/uL (ref 3.4–10.8)

## 2017-12-26 LAB — COMPREHENSIVE METABOLIC PANEL
ALT: 14 IU/L (ref 0–32)
AST: 18 IU/L (ref 0–40)
Albumin/Globulin Ratio: 1.7 (ref 1.2–2.2)
Albumin: 4.3 g/dL (ref 3.6–4.8)
Alkaline Phosphatase: 81 IU/L (ref 39–117)
BUN/Creatinine Ratio: 13 (ref 12–28)
BUN: 14 mg/dL (ref 8–27)
Bilirubin Total: 0.4 mg/dL (ref 0.0–1.2)
CALCIUM: 8.9 mg/dL (ref 8.7–10.3)
CO2: 15 mmol/L — AB (ref 20–29)
CREATININE: 1.09 mg/dL — AB (ref 0.57–1.00)
Chloride: 102 mmol/L (ref 96–106)
GFR calc Af Amer: 63 mL/min/{1.73_m2} (ref >59)
GFR, EST NON AFRICAN AMERICAN: 55 mL/min/{1.73_m2} — AB (ref >59)
Globulin, Total: 2.5 g/dL (ref 1.5–4.5)
Glucose: 302 mg/dL — ABNORMAL HIGH (ref 65–99)
Potassium: 3.9 mmol/L (ref 3.5–5.2)
Sodium: 139 mmol/L (ref 134–144)
TOTAL PROTEIN: 6.8 g/dL (ref 6.0–8.5)

## 2017-12-26 LAB — TSH: TSH: 1.32 u[IU]/mL (ref 0.450–4.500)

## 2017-12-26 LAB — LIPASE: Lipase: 56 U/L (ref 14–72)

## 2017-12-27 ENCOUNTER — Encounter: Payer: Self-pay | Admitting: Gastroenterology

## 2018-01-01 ENCOUNTER — Ambulatory Visit (INDEPENDENT_AMBULATORY_CARE_PROVIDER_SITE_OTHER): Payer: PPO | Admitting: Family Medicine

## 2018-01-01 VITALS — BP 150/62 | HR 62 | Temp 98.0°F | Resp 16 | Wt 234.0 lb

## 2018-01-01 DIAGNOSIS — I10 Essential (primary) hypertension: Secondary | ICD-10-CM

## 2018-01-01 DIAGNOSIS — I6389 Other cerebral infarction: Secondary | ICD-10-CM

## 2018-01-01 DIAGNOSIS — Z794 Long term (current) use of insulin: Secondary | ICD-10-CM | POA: Diagnosis not present

## 2018-01-01 DIAGNOSIS — E118 Type 2 diabetes mellitus with unspecified complications: Secondary | ICD-10-CM

## 2018-01-01 DIAGNOSIS — R1310 Dysphagia, unspecified: Secondary | ICD-10-CM | POA: Diagnosis not present

## 2018-01-01 DIAGNOSIS — M545 Low back pain, unspecified: Secondary | ICD-10-CM

## 2018-01-01 DIAGNOSIS — G8929 Other chronic pain: Secondary | ICD-10-CM

## 2018-01-01 DIAGNOSIS — Z6841 Body Mass Index (BMI) 40.0 and over, adult: Secondary | ICD-10-CM

## 2018-01-01 DIAGNOSIS — R42 Dizziness and giddiness: Secondary | ICD-10-CM

## 2018-01-01 MED ORDER — CLONIDINE HCL 0.2 MG PO TABS: 0.1000 mg | ORAL_TABLET | Freq: Two times a day (BID) | ORAL | 12 refills | Status: DC

## 2018-01-01 MED ORDER — CLONIDINE HCL 0.3 MG PO TABS: 0.3000 mg | ORAL_TABLET | Freq: Two times a day (BID) | ORAL | 12 refills | Status: DC

## 2018-01-01 MED ORDER — MECLIZINE HCL 25 MG PO TABS: 25.0000 mg | ORAL_TABLET | Freq: Three times a day (TID) | ORAL | 0 refills | Status: DC | PRN

## 2018-01-01 MED ORDER — RANITIDINE HCL 150 MG PO CAPS: 150.0000 mg | ORAL_CAPSULE | Freq: Every evening | ORAL | 12 refills | Status: DC
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
## 2018-01-04 ENCOUNTER — Encounter: Payer: Self-pay | Admitting: Family Medicine

## 2018-01-04 ENCOUNTER — Ambulatory Visit (INDEPENDENT_AMBULATORY_CARE_PROVIDER_SITE_OTHER): Payer: PPO | Admitting: Family Medicine

## 2018-01-04 VITALS — BP 144/62 | HR 70 | Resp 16 | Wt 234.0 lb

## 2018-01-04 DIAGNOSIS — K219 Gastro-esophageal reflux disease without esophagitis: Secondary | ICD-10-CM | POA: Diagnosis not present

## 2018-01-04 DIAGNOSIS — I10 Essential (primary) hypertension: Secondary | ICD-10-CM

## 2018-01-04 NOTE — Patient Instructions (Signed)
Food Choices for Gastroesophageal Reflux Disease, Adult When you have gastroesophageal reflux disease (GERD), the foods you eat and your eating habits are very important. Choosing the right foods can help ease your discomfort. What guidelines do I need to follow?  Choose fruits, vegetables, whole grains, and low-fat dairy products.  Choose low-fat meat, fish, and poultry.  Limit fats such as oils, salad dressings, butter, nuts, and avocado.  Keep a food diary. This helps you identify foods that cause symptoms.  Avoid foods that cause symptoms. These may be different for everyone.  Eat small meals often instead of 3 large meals a day.  Eat your meals slowly, in a place where you are relaxed.  Limit fried foods.  Cook foods using methods other than frying.  Avoid drinking alcohol.  Avoid drinking large amounts of liquids with your meals.  Avoid bending over or lying down until 2-3 hours after eating. What foods are not recommended? These are some foods and drinks that may make your symptoms worse: Vegetables  Tomatoes. Tomato juice. Tomato and spaghetti sauce. Chili peppers. Onion and garlic. Horseradish. Fruits  Oranges, grapefruit, and lemon (fruit and juice). Meats  High-fat meats, fish, and poultry. This includes hot dogs, ribs, ham, sausage, salami, and bacon. Dairy  Whole milk and chocolate milk. Sour cream. Cream. Butter. Ice cream. Cream cheese. Drinks  Coffee and tea. Bubbly (carbonated) drinks or energy drinks. Condiments  Hot sauce. Barbecue sauce. Sweets/Desserts  Chocolate and cocoa. Donuts. Peppermint and spearmint. Fats and Oils  High-fat foods. This includes French fries and potato chips. Other  Vinegar. Strong spices. This includes black pepper, white pepper, red pepper, cayenne, curry powder, cloves, ginger, and chili powder. The items listed above may not be a complete list of foods and drinks to avoid. Contact your dietitian for more information.    This information is not intended to replace advice given to you by your health care provider. Make sure you discuss any questions you have with your health care provider. Document Released: 01/02/2012 Document Revised: 12/09/2015 Document Reviewed: 05/07/2013 Elsevier Interactive Patient Education  2017 Elsevier Inc.  

## 2018-01-04 NOTE — Progress Notes (Signed)
Patient: Susan House Female    DOB: 12-17-1955   62 y.o.   MRN: [id number] Visit Date: 01/04/2018  Today's Provider: Lavon Paganini, MD   I, Martha Clan, CMA, am acting as scribe for Lavon Paganini, MD.  No chief complaint on file.  Subjective:    HPI      Hypertension, follow-up:  BP Readings from Last 3 Encounters:  01/01/18 (!) 150/62  12/25/17 138/78  12/11/17 138/60    She was last seen for hypertension 3 days ago.  BP at that visit was 150/62. Management since that visit includes increasing Clonidine from 0.1 mg BID to 0.3 mg BID. She reports good compliance with treatment. She is not having side effects.  She is experiencing none.  Patient denies chest pain, chest pressure/discomfort, claudication, dyspnea, exertional chest pressure/discomfort, fatigue, irregular heart beat, lower extremity edema, near-syncope, orthopnea, palpitations and syncope.   Cardiovascular risk factors include diabetes mellitus, dyslipidemia, hypertension and obesity (BMI >= 30 kg/m2).  Use of agents associated with hypertension: none.     Weight trend: improving Wt Readings from Last 3 Encounters:  01/01/18 234 lb (106.1 kg)  12/25/17 214 lb (97.1 kg)  12/04/17 236 lb (107 kg)    Current diet: improving; pt was seen 3 days ago for weight loss and vertigo. She was started on Antivert, with relief. Her vertigo and appetite has improved.  ------------------------------------------------------------------------   Follow up for Dysphagia  The patient was last seen for this 3 days ago. Changes made at last visit include starting Protonix.  She reports good compliance with treatment. She feels that condition is Improved. She is not having side effects.   ------------------------------------------------------------------------------------    Allergies  Allergen Reactions  . Morphine And Related Anaphylaxis  . Fentanyl Other (See Comments)    Unknown  reaction.  . Reglan [Metoclopramide]     Other reaction(s): Unknown Elevated BP  . Simvastatin Other (See Comments)    Muscle pain  . Betadine [Povidone Iodine] Rash  . Iodine Rash    Other reaction(s): Unknown  . Tetracyclines & Related Rash    Other reaction(s): Unknown     Current Outpatient Medications:  .  ACCU-CHEK SOFTCLIX LANCETS lancets, CHECK BLOOD SUGAR THREE TIMES DAILY, Disp: 300 each, Rfl: 3 .  acyclovir ointment (ZOVIRAX) 5 %, Apply 1 application topically every 3 (three) hours., Disp: 5 g, Rfl: 1 .  amLODipine (NORVASC) 10 MG tablet, Take 1 tablet (10 mg total) by mouth daily., Disp: 90 tablet, Rfl: 3 .  amoxicillin-clavulanate (AUGMENTIN) 875-125 MG tablet, Take 1 tablet by mouth 2 (two) times daily., Disp: 20 tablet, Rfl: 0 .  atorvastatin (LIPITOR) 20 MG tablet, Take 20 mg by mouth daily., Disp: , Rfl:  .  Blood Glucose Monitoring Suppl (ACCU-CHEK AVIVA PLUS) w/Device KIT, Check sugar three times daily DX E11.9-needs a meter, Disp: 1 kit, Rfl: 0 .  cloNIDine (CATAPRES) 0.3 MG tablet, Take 1 tablet (0.3 mg total) by mouth 2 (two) times daily., Disp: 60 tablet, Rfl: 12 .  clopidogrel (PLAVIX) 75 MG tablet, Take 1 tablet (75 mg total) by mouth daily., Disp: 90 tablet, Rfl: 3 .  diphenoxylate-atropine (LOMOTIL) 2.5-0.025 MG tablet, 2 tablets initially and then 1 tablet every 6 hours as needed for diarrhea, Disp: 60 tablet, Rfl: 1 .  enalapril (VASOTEC) 20 MG tablet, Take 1 tablet (20 mg total) by mouth 2 (two) times daily., Disp: 180 tablet, Rfl: 3 .  glucose blood (ACCU-CHEK AVIVA PLUS)  test strip, TEST BLOOD SUGAR THREE TIMES DAILY, Disp: 300 each, Rfl: 3 .  hydrALAZINE (APRESOLINE) 100 MG tablet, Take 1 tablet (100 mg total) by mouth 2 (two) times daily., Disp: 60 tablet, Rfl: 3 .  HYDROcodone-acetaminophen (NORCO) 10-325 MG tablet, Take 1-2 tablets by mouth every 6 (six) hours as needed., Disp: 60 tablet, Rfl: 0 .  insulin aspart protamine- aspart (NOVOLOG MIX 70/30)  (70-30) 100 UNIT/ML injection, Inject 10-35 Units into the skin 2 (two) times daily. , Disp: , Rfl:  .  Lancet Devices (ACCU-CHEK SOFTCLIX) lancets, Check sugar three times daily DX E11.9, Disp: 100 each, Rfl: 3 .  levETIRAcetam (KEPPRA) 500 MG tablet, Take 1 tablet (500 mg total) by mouth 2 (two) times daily. 500 mg bid., Disp: 180 tablet, Rfl: 3 .  meclizine (ANTIVERT) 25 MG tablet, Take 1 tablet (25 mg total) by mouth 3 (three) times daily as needed for dizziness., Disp: 30 tablet, Rfl: 0 .  metFORMIN (GLUCOPHAGE) 1000 MG tablet, Take 1 tablet (1,000 mg total) by mouth 2 (two) times daily., Disp: 180 tablet, Rfl: 3 .  metoprolol succinate (TOPROL-XL) 100 MG 24 hr tablet, Take 1 tablet (100 mg total) by mouth daily., Disp: 90 tablet, Rfl: 3 .  nitroGLYCERIN (NITROSTAT) 0.4 MG SL tablet, Place 1 tablet (0.4 mg total) under the tongue every 5 (five) minutes as needed for chest pain., Disp: 25 tablet, Rfl: 0 .  oxybutynin (DITROPAN) 5 MG tablet, Take 1 tablet (5 mg total) by mouth 2 (two) times daily., Disp: 180 tablet, Rfl: 3 .  pantoprazole (PROTONIX) 40 MG tablet, Take 1 tablet (40 mg total) by mouth daily., Disp: 90 tablet, Rfl: 3 .  ranitidine (ZANTAC) 150 MG capsule, Take 1 capsule (150 mg total) by mouth every evening., Disp: 30 capsule, Rfl: 12 .  triamcinolone cream (KENALOG) 0.1 %, Can use thin layer on affected areas 2x daily for one week at a time. Not for face., Disp: 30 g, Rfl: 0  Review of Systems  Constitutional: Negative for activity change, appetite change, chills, diaphoresis, fatigue, fever and unexpected weight change.  Respiratory: Negative for choking and shortness of breath.   Cardiovascular: Negative for chest pain, palpitations and leg swelling.  Gastrointestinal: Negative for nausea.  Neurological: Negative for dizziness.    Social History   Tobacco Use  . Smoking status: Never Smoker  . Smokeless tobacco: Never Used  Substance Use Topics  . Alcohol use: No    Objective:   There were no vitals taken for this visit. There were no vitals filed for this visit.   Physical Exam  Constitutional: She is oriented to person, place, and time. She appears well-developed and well-nourished. No distress.  HENT:  Head: Normocephalic and atraumatic.  Mouth/Throat: Oropharynx is clear and moist.  Eyes: Conjunctivae are normal. No scleral icterus.  Neck: Neck supple. No thyromegaly present.  Cardiovascular: Normal rate, regular rhythm and normal heart sounds.  Pulmonary/Chest: Effort normal and breath sounds normal. No respiratory distress. She has no wheezes. She has no rales.  Abdominal: Soft. Bowel sounds are normal. She exhibits no distension. There is no tenderness. There is no rebound and no guarding.  Musculoskeletal: She exhibits no edema or tenderness.  Lymphadenopathy:    She has no cervical adenopathy.  Neurological: She is alert and oriented to person, place, and time.  Skin: Skin is warm and dry. Capillary refill takes less than 2 seconds. No rash noted.  Psychiatric: She has a normal mood and affect. Her behavior  is normal.  Vitals reviewed.       Assessment & Plan:   1. Essential hypertension - Chronic, improving with increased dose of clonidine -Patient's symptoms including vertigo and abdominal pain have improved - Though her blood pressure is slightly above goal today, as she is asymptomatic, we will not change medications at this time -She has follow-up appointment with PCP in 1 month  2. Gastroesophageal reflux disease, esophagitis presence not specified -Patient reports that symptoms are much improved with addition of Protonix - Her appearance and exam today are benign -Return precautions discussed -Discussed food choices to improve symptoms   Return if symptoms worsen or fail to improve.   The entirety of the information documented in the History of Present Illness, Review of Systems and Physical Exam were personally  obtained by me. Portions of this information were initially documented by Raquel Sarna Ratchford, CMA and reviewed by me for thoroughness and accuracy.    Virginia Crews, MD, MPH [facility] 01/04/2018 10:03 AM

## 2018-01-12 ENCOUNTER — Other Ambulatory Visit: Payer: Self-pay | Admitting: Family Medicine

## 2018-01-12 DIAGNOSIS — I10 Essential (primary) hypertension: Secondary | ICD-10-CM

## 2018-01-14 ENCOUNTER — Ambulatory Visit (INDEPENDENT_AMBULATORY_CARE_PROVIDER_SITE_OTHER): Payer: PPO | Admitting: Family Medicine

## 2018-01-14 ENCOUNTER — Encounter: Payer: Self-pay | Admitting: Family Medicine

## 2018-01-14 VITALS — BP 98/62 | HR 64 | Temp 98.4°F | Resp 16

## 2018-01-14 DIAGNOSIS — E118 Type 2 diabetes mellitus with unspecified complications: Secondary | ICD-10-CM

## 2018-01-14 DIAGNOSIS — Z6841 Body Mass Index (BMI) 40.0 and over, adult: Secondary | ICD-10-CM | POA: Diagnosis not present

## 2018-01-14 DIAGNOSIS — Z794 Long term (current) use of insulin: Secondary | ICD-10-CM | POA: Diagnosis not present

## 2018-01-14 DIAGNOSIS — I63412 Cerebral infarction due to embolism of left middle cerebral artery: Secondary | ICD-10-CM | POA: Diagnosis not present

## 2018-01-14 DIAGNOSIS — I69359 Hemiplegia and hemiparesis following cerebral infarction affecting unspecified side: Secondary | ICD-10-CM | POA: Diagnosis not present

## 2018-01-14 DIAGNOSIS — J324 Chronic pansinusitis: Secondary | ICD-10-CM | POA: Diagnosis not present

## 2018-01-14 MED ORDER — AMOXICILLIN-POT CLAVULANATE 875-125 MG PO TABS: 1.0000 | ORAL_TABLET | Freq: Two times a day (BID) | ORAL | 0 refills | Status: DC

## 2018-01-14 NOTE — Progress Notes (Signed)
Patient: Susan House Female    DOB: 1956/01/14   62 y.o.   MRN: [id number] Visit Date: 01/14/2018  Today's Provider: Wilhemena Durie, MD   Chief Complaint  Patient presents with  . URI   Subjective:    URI   This is a new problem. The current episode started in the past 7 days. The problem has been gradually worsening. There has been no fever. Associated symptoms include congestion, coughing, headaches, nausea, rhinorrhea, sinus pain and sneezing. Pertinent negatives include no chest pain, rash or vomiting. She has tried decongestant for the symptoms. The treatment provided no relief.   Patient also mentions that she had a fall this morning and EMS had to come and get her up. She missed a step out of her bed. She has a minor bruise on her right lower extremity.   Her fasting blood sugar was 420 this morning. She has not checked her BS since this morning, but reports that she has had 36 units of insulin.  She also c/o chronic soreness/pain in area of coccyx after sitting for a period of time.She is worried it could be a pilonidal cyst.     Allergies  Allergen Reactions  . Morphine And Related Anaphylaxis  . Fentanyl Other (See Comments)    Unknown reaction.  . Reglan [Metoclopramide]     Other reaction(s): Unknown Elevated BP  . Simvastatin Other (See Comments)    Muscle pain  . Betadine [Povidone Iodine] Rash  . Iodine Rash    Other reaction(s): Unknown  . Tetracyclines & Related Rash    Other reaction(s): Unknown     Current Outpatient Medications:  .  ACCU-CHEK SOFTCLIX LANCETS lancets, CHECK BLOOD SUGAR THREE TIMES DAILY, Disp: 300 each, Rfl: 3 .  amLODipine (NORVASC) 10 MG tablet, Take 1 tablet (10 mg total) by mouth daily., Disp: 90 tablet, Rfl: 3 .  atorvastatin (LIPITOR) 20 MG tablet, Take 20 mg by mouth daily., Disp: , Rfl:  .  Blood Glucose Monitoring Suppl (ACCU-CHEK AVIVA PLUS) w/Device KIT, Check sugar three times daily DX E11.9-needs a meter,  Disp: 1 kit, Rfl: 0 .  cloNIDine (CATAPRES) 0.3 MG tablet, Take 1 tablet (0.3 mg total) by mouth 2 (two) times daily., Disp: 60 tablet, Rfl: 12 .  clopidogrel (PLAVIX) 75 MG tablet, Take 1 tablet (75 mg total) by mouth daily., Disp: 90 tablet, Rfl: 3 .  enalapril (VASOTEC) 20 MG tablet, Take 1 tablet (20 mg total) by mouth 2 (two) times daily., Disp: 180 tablet, Rfl: 3 .  glucose blood (ACCU-CHEK AVIVA PLUS) test strip, TEST BLOOD SUGAR THREE TIMES DAILY, Disp: 300 each, Rfl: 3 .  hydrALAZINE (APRESOLINE) 100 MG tablet, Take 1 tablet (100 mg total) by mouth 2 (two) times daily., Disp: 60 tablet, Rfl: 11 .  HYDROcodone-acetaminophen (NORCO) 10-325 MG tablet, Take 1-2 tablets by mouth every 6 (six) hours as needed., Disp: 60 tablet, Rfl: 0 .  insulin aspart protamine- aspart (NOVOLOG MIX 70/30) (70-30) 100 UNIT/ML injection, Inject 10-35 Units into the skin 2 (two) times daily. , Disp: , Rfl:  .  Lancet Devices (ACCU-CHEK SOFTCLIX) lancets, Check sugar three times daily DX E11.9, Disp: 100 each, Rfl: 3 .  levETIRAcetam (KEPPRA) 500 MG tablet, Take 1 tablet (500 mg total) by mouth 2 (two) times daily. 500 mg bid., Disp: 180 tablet, Rfl: 3 .  meclizine (ANTIVERT) 25 MG tablet, Take 1 tablet (25 mg total) by mouth 3 (three) times daily  as needed for dizziness., Disp: 30 tablet, Rfl: 0 .  metFORMIN (GLUCOPHAGE) 1000 MG tablet, Take 1 tablet (1,000 mg total) by mouth 2 (two) times daily., Disp: 180 tablet, Rfl: 3 .  metoprolol succinate (TOPROL-XL) 100 MG 24 hr tablet, Take 1 tablet (100 mg total) by mouth daily., Disp: 90 tablet, Rfl: 3 .  nitroGLYCERIN (NITROSTAT) 0.4 MG SL tablet, Place 1 tablet (0.4 mg total) under the tongue every 5 (five) minutes as needed for chest pain., Disp: 25 tablet, Rfl: 0 .  oxybutynin (DITROPAN) 5 MG tablet, Take 1 tablet (5 mg total) by mouth 2 (two) times daily., Disp: 180 tablet, Rfl: 3 .  pantoprazole (PROTONIX) 40 MG tablet, Take 1 tablet (40 mg total) by mouth daily.,  Disp: 90 tablet, Rfl: 3 .  ranitidine (ZANTAC) 150 MG capsule, Take 1 capsule (150 mg total) by mouth every evening., Disp: 30 capsule, Rfl: 12 .  triamcinolone cream (KENALOG) 0.1 %, Can use thin layer on affected areas 2x daily for one week at a time. Not for face., Disp: 30 g, Rfl: 0 .  diphenoxylate-atropine (LOMOTIL) 2.5-0.025 MG tablet, 2 tablets initially and then 1 tablet every 6 hours as needed for diarrhea (Patient not taking: Reported on 01/04/2018), Disp: 60 tablet, Rfl: 1  Review of Systems  Constitutional: Positive for fatigue.  HENT: Positive for congestion, postnasal drip, rhinorrhea, sinus pressure, sinus pain and sneezing.   Eyes: Negative.   Respiratory: Positive for cough.   Cardiovascular: Negative for chest pain, palpitations and leg swelling.  Gastrointestinal: Positive for nausea. Negative for vomiting.  Endocrine: Negative for cold intolerance, heat intolerance, polydipsia, polyphagia and polyuria.  Musculoskeletal: Positive for arthralgias and myalgias.  Skin: Negative for pallor, rash and wound.  Allergic/Immunologic: Negative.   Neurological: Positive for weakness, light-headedness and headaches. Negative for syncope.  Hematological: Bruises/bleeds easily.  Psychiatric/Behavioral: Negative.     Social History   Tobacco Use  . Smoking status: Never Smoker  . Smokeless tobacco: Never Used  Substance Use Topics  . Alcohol use: No   Objective:   BP 98/62 (BP Location: Right Arm, Patient Position: Sitting, Cuff Size: Large)   Pulse 64   Temp 98.4 F (36.9 C)   Resp 16   SpO2 98%  Vitals:   01/14/18 1557  BP: 98/62  Pulse: 64  Resp: 16  Temp: 98.4 F (36.9 C)  SpO2: 98%     Physical Exam  Constitutional: She is oriented to person, place, and time. She appears well-developed and well-nourished.  HENT:  Head: Normocephalic and atraumatic.  Right Ear: External ear normal.  Left Ear: External ear normal.  Nose: Nose normal.  Mouth/Throat:  Oropharynx is clear and moist.  Sinus tenderness noted.  Eyes: Conjunctivae are normal. No scleral icterus.  Neck: No thyromegaly present.  Cardiovascular: Normal rate, regular rhythm and normal heart sounds.  Pulmonary/Chest: Effort normal and breath sounds normal.  Abdominal: Soft.  Musculoskeletal:  No pilonidal cyst or mas in buttocks area. Tenderness is noted with deep palpation in area of coccyx.  Lymphadenopathy:    She has no cervical adenopathy.  Neurological: She is alert and oriented to person, place, and time.  Mild left arm hemiparesis.Especially hand.  Skin: Skin is warm and dry.  Psychiatric: She has a normal mood and affect. Her behavior is normal. Judgment and thought content normal.        Assessment & Plan:     1. Pansinusitis, unspecified chronicity  - amoxicillin-clavulanate (AUGMENTIN) 875-125 MG tablet; Take  1 tablet by mouth 2 (two) times daily.  Dispense: 20 tablet; Refill: 0 2.TIIDM Worse recently with pt being sick. Slight SS discussed with 70/30 which pt has been doing. 3.s/p CVA with left hemiparesis 4.Falls 5.Sacral Pain Use cushions with sitting.  6.Morbid Obesity     I have done the exam and reviewed the above chart and it is accurate to the best of my knowledge. Development worker, community has been used in this note in any air is in the dictation or transcription are unintentional.  Wilhemena Durie, MD  [place]

## 2018-01-22 ENCOUNTER — Encounter: Payer: Self-pay | Admitting: Family Medicine

## 2018-01-22 ENCOUNTER — Ambulatory Visit (INDEPENDENT_AMBULATORY_CARE_PROVIDER_SITE_OTHER): Payer: PPO | Admitting: Family Medicine

## 2018-01-22 VITALS — BP 130/62 | HR 54 | Temp 97.8°F | Resp 15 | Wt 237.0 lb

## 2018-01-22 DIAGNOSIS — Z794 Long term (current) use of insulin: Secondary | ICD-10-CM | POA: Diagnosis not present

## 2018-01-22 DIAGNOSIS — E118 Type 2 diabetes mellitus with unspecified complications: Secondary | ICD-10-CM

## 2018-01-22 DIAGNOSIS — R29898 Other symptoms and signs involving the musculoskeletal system: Secondary | ICD-10-CM | POA: Diagnosis not present

## 2018-01-22 DIAGNOSIS — Z87440 Personal history of urinary (tract) infections: Secondary | ICD-10-CM | POA: Diagnosis not present

## 2018-01-22 LAB — POCT URINALYSIS DIPSTICK
Bilirubin, UA: NEGATIVE
Blood, UA: NEGATIVE
Glucose, UA: POSITIVE — AB
Ketones, UA: NEGATIVE
LEUKOCYTES UA: NEGATIVE
NITRITE UA: NEGATIVE
PROTEIN UA: POSITIVE — AB
Spec Grav, UA: 1.025 (ref 1.010–1.025)
Urobilinogen, UA: 0.2 E.U./dL
pH, UA: 5 (ref 5.0–8.0)

## 2018-01-22 LAB — GLUCOSE, POCT (MANUAL RESULT ENTRY): POC Glucose: 198 mg/dl — AB (ref 70–99)

## 2018-01-22 NOTE — Progress Notes (Signed)
  Subjective:     Patient ID: Susan House, female   DOB: 30-Dec-1955, 62 y.o.   MRN: [id number] Chief Complaint  Patient presents with  . Fatigue    Patient comes in office today with cocnerns of fatigue, dizziness and leg pain that began this morning.    HPI She is completing a course of Augmentin for a sinus infection. Sugar had been running high so she increased her Novolog 70/30 to 40 units twice daily. Has not eaten or had insulin today. Noticed that she could not walk as well in her house today due to increased weakness in her left leg. Denies fall. Hx of left hemiparesis after prior CVA. She has concern about an emerging CVA. Accompanied by her daughter and granddaughter.  Review of Systems     Objective:   Physical Exam  Constitutional: She appears well-developed and well-nourished. No distress.  Eyes: Pupils are equal, round, and reactive to light. EOM are normal.  Neck: Carotid bruit is not present.  Cardiovascular: Regular rhythm.  No murmur heard. Bradycardic per use of beta blocker  Pulmonary/Chest: Breath sounds normal.  Musculoskeletal:  Right UE and RLE 5/5. Left UE grip only 5/5. Left LE 2-3/5       Assessment:    1. Weakness of left leg: Discussed with her that it is not clear that this is a new neurological event and may be more reflective of recent sinus infection with elevated sugar and poor p.o intake. She defers ER evaluation at this time.  2. Type 2 diabetes mellitus with complication, with long-term current use of insulin (HCC) - POCT glucose (manual entry)  3. History of UTI - POCT urinalysis dipstick    Plan:    Phone f/u in AM. Report to ER with worsening neurological sx.

## 2018-01-22 NOTE — Patient Instructions (Signed)
We will do phone follow up in the AM. Do increase fluid intake and start eating. If worsening neurological sx to report to the ER.

## 2018-01-23 ENCOUNTER — Other Ambulatory Visit: Payer: Self-pay | Admitting: Family Medicine

## 2018-01-23 NOTE — Telephone Encounter (Signed)
pt needs refill on her hydrocodone 10-325  Susan House  Thanks [employer]

## 2018-01-24 DIAGNOSIS — S9031XA Contusion of right foot, initial encounter: Secondary | ICD-10-CM | POA: Diagnosis not present

## 2018-01-24 DIAGNOSIS — M79671 Pain in right foot: Secondary | ICD-10-CM | POA: Diagnosis not present

## 2018-01-24 DIAGNOSIS — S90121A Contusion of right lesser toe(s) without damage to nail, initial encounter: Secondary | ICD-10-CM | POA: Diagnosis not present

## 2018-01-24 DIAGNOSIS — E119 Type 2 diabetes mellitus without complications: Secondary | ICD-10-CM | POA: Diagnosis not present

## 2018-01-24 DIAGNOSIS — B351 Tinea unguium: Secondary | ICD-10-CM | POA: Diagnosis not present

## 2018-01-24 MED ORDER — HYDROCODONE-ACETAMINOPHEN 10-325 MG PO TABS: 1.0000 | ORAL_TABLET | Freq: Four times a day (QID) | ORAL | 0 refills | Status: DC | PRN

## 2018-01-31 ENCOUNTER — Encounter: Payer: Self-pay | Admitting: Family Medicine

## 2018-01-31 ENCOUNTER — Ambulatory Visit (INDEPENDENT_AMBULATORY_CARE_PROVIDER_SITE_OTHER): Payer: PPO | Admitting: Family Medicine

## 2018-01-31 ENCOUNTER — Ambulatory Visit: Payer: PPO

## 2018-01-31 ENCOUNTER — Ambulatory Visit: Payer: Self-pay | Admitting: Family Medicine

## 2018-01-31 ENCOUNTER — Ambulatory Visit
Admission: RE | Admit: 2018-01-31 | Discharge: 2018-01-31 | Disposition: A | Discharge status: Home / Self Care | Payer: PPO | Source: Ambulatory Visit | Attending: Family Medicine | Admitting: Family Medicine

## 2018-01-31 VITALS — BP 126/68 | HR 60 | Temp 98.5°F | Resp 16 | Wt 234.0 lb

## 2018-01-31 DIAGNOSIS — M533 Sacrococcygeal disorders, not elsewhere classified: Secondary | ICD-10-CM | POA: Diagnosis not present

## 2018-01-31 NOTE — Progress Notes (Signed)
Patient: Susan House Female    DOB: 08-06-1955   62 y.o.   MRN: [id number] Visit Date: 01/31/2018  Today's Provider: Wilhemena Durie, MD   No chief complaint on file.  Subjective:    HPI Patient comes in today c/o pain on her bottom. She reports that it has been there for a few weeks. She feels a shooting pain when she sits down.     Allergies  Allergen Reactions  . Morphine And Related Anaphylaxis  . Fentanyl Other (See Comments)    Unknown reaction.  . Reglan [Metoclopramide]     Other reaction(s): Unknown Elevated BP  . Simvastatin Other (See Comments)    Muscle pain  . Betadine [Povidone Iodine] Rash  . Iodine Rash    Other reaction(s): Unknown  . Tetracyclines & Related Rash    Other reaction(s): Unknown     Current Outpatient Medications:  .  ACCU-CHEK SOFTCLIX LANCETS lancets, CHECK BLOOD SUGAR THREE TIMES DAILY, Disp: 300 each, Rfl: 3 .  amLODipine (NORVASC) 10 MG tablet, Take 1 tablet (10 mg total) by mouth daily., Disp: 90 tablet, Rfl: 3 .  amoxicillin-clavulanate (AUGMENTIN) 875-125 MG tablet, Take 1 tablet by mouth 2 (two) times daily., Disp: 20 tablet, Rfl: 0 .  atorvastatin (LIPITOR) 20 MG tablet, Take 20 mg by mouth daily., Disp: , Rfl:  .  Blood Glucose Monitoring Suppl (ACCU-CHEK AVIVA PLUS) w/Device KIT, Check sugar three times daily DX E11.9-needs a meter, Disp: 1 kit, Rfl: 0 .  cloNIDine (CATAPRES) 0.3 MG tablet, Take 1 tablet (0.3 mg total) by mouth 2 (two) times daily., Disp: 60 tablet, Rfl: 12 .  clopidogrel (PLAVIX) 75 MG tablet, Take 1 tablet (75 mg total) by mouth daily., Disp: 90 tablet, Rfl: 3 .  diphenoxylate-atropine (LOMOTIL) 2.5-0.025 MG tablet, 2 tablets initially and then 1 tablet every 6 hours as needed for diarrhea, Disp: 60 tablet, Rfl: 1 .  enalapril (VASOTEC) 20 MG tablet, Take 1 tablet (20 mg total) by mouth 2 (two) times daily., Disp: 180 tablet, Rfl: 3 .  glucose blood (ACCU-CHEK AVIVA PLUS) test strip, TEST BLOOD  SUGAR THREE TIMES DAILY, Disp: 300 each, Rfl: 3 .  hydrALAZINE (APRESOLINE) 100 MG tablet, Take 1 tablet (100 mg total) by mouth 2 (two) times daily., Disp: 60 tablet, Rfl: 11 .  HYDROcodone-acetaminophen (NORCO) 10-325 MG tablet, Take 1-2 tablets by mouth every 6 (six) hours as needed., Disp: 100 tablet, Rfl: 0 .  insulin aspart protamine- aspart (NOVOLOG MIX 70/30) (70-30) 100 UNIT/ML injection, Inject 10-35 Units into the skin 2 (two) times daily. , Disp: , Rfl:  .  Lancet Devices (ACCU-CHEK SOFTCLIX) lancets, Check sugar three times daily DX E11.9, Disp: 100 each, Rfl: 3 .  levETIRAcetam (KEPPRA) 500 MG tablet, Take 1 tablet (500 mg total) by mouth 2 (two) times daily. 500 mg bid., Disp: 180 tablet, Rfl: 3 .  meclizine (ANTIVERT) 25 MG tablet, Take 1 tablet (25 mg total) by mouth 3 (three) times daily as needed for dizziness., Disp: 30 tablet, Rfl: 0 .  metFORMIN (GLUCOPHAGE) 1000 MG tablet, Take 1 tablet (1,000 mg total) by mouth 2 (two) times daily., Disp: 180 tablet, Rfl: 3 .  metoprolol succinate (TOPROL-XL) 100 MG 24 hr tablet, Take 1 tablet (100 mg total) by mouth daily., Disp: 90 tablet, Rfl: 3 .  nitroGLYCERIN (NITROSTAT) 0.4 MG SL tablet, Place 1 tablet (0.4 mg total) under the tongue every 5 (five) minutes as needed for chest  pain., Disp: 25 tablet, Rfl: 0 .  oxybutynin (DITROPAN) 5 MG tablet, Take 1 tablet (5 mg total) by mouth 2 (two) times daily., Disp: 180 tablet, Rfl: 3 .  pantoprazole (PROTONIX) 40 MG tablet, Take 1 tablet (40 mg total) by mouth daily., Disp: 90 tablet, Rfl: 3 .  ranitidine (ZANTAC) 150 MG capsule, Take 1 capsule (150 mg total) by mouth every evening., Disp: 30 capsule, Rfl: 12 .  triamcinolone cream (KENALOG) 0.1 %, Can use thin layer on affected areas 2x daily for one week at a time. Not for face., Disp: 30 g, Rfl: 0  Review of Systems  Constitutional: Negative.   Eyes: Negative.   Respiratory: Negative.   Cardiovascular: Negative for leg swelling.    Gastrointestinal: Negative for abdominal distention, abdominal pain, anal bleeding, blood in stool, constipation, diarrhea, nausea, rectal pain and vomiting.  Endocrine: Negative.   Musculoskeletal: Positive for myalgias.  Skin: Negative for color change, pallor, rash and wound.  Allergic/Immunologic: Negative.   Psychiatric/Behavioral: Negative.     Social History   Tobacco Use  . Smoking status: Never Smoker  . Smokeless tobacco: Never Used  Substance Use Topics  . Alcohol use: No   Objective:   BP 126/68   Pulse 60   Temp 98.5 F (36.9 C)   Resp 16   Wt 234 lb (106.1 kg)   SpO2 98%   BMI 44.21 kg/m  Vitals:   01/31/18 1436  BP: 126/68  Pulse: 60  Resp: 16  Temp: 98.5 F (36.9 C)  SpO2: 98%  Weight: 234 lb (106.1 kg)     Physical Exam  Constitutional: She appears well-developed and well-nourished.  HENT:  Head: Normocephalic and atraumatic.  Eyes: Conjunctivae are normal. No scleral icterus.  Neck: No thyromegaly present.  Cardiovascular: Normal rate, regular rhythm and normal heart sounds.  Pulmonary/Chest: Effort normal.  Abdominal: Soft.  Musculoskeletal: She exhibits tenderness. She exhibits no edema.  Tender over coccyx.No fluctuance. Skin appears normal in area.  Skin: Skin is warm and dry.  Psychiatric: She has a normal mood and affect. Her behavior is normal. Judgment and thought content normal.        Assessment & Plan:     1. Coccydynia Xray,use doughnut to keep pressure off--RTC few weeks.Reexamine if still hurting. - DG Sacrum/Coccyx; Future 2.morbid obesity 3.OA     I have done the exam and reviewed the chart and it is accurate to the best of my knowledge. Development worker, community has been used and  any errors in dictation or transcription are unintentional. Miguel Aschoff M.D. [place], MD  [facility]

## 2018-02-01 ENCOUNTER — Telehealth: Payer: Self-pay

## 2018-02-01 NOTE — Telephone Encounter (Signed)
-----   Message from Jerrol Banana., MD sent at 02/01/2018 11:12 AM EDT ----- Odette Horns ok.

## 2018-02-01 NOTE — Telephone Encounter (Signed)
Pt advised.   Thanks,   -Laura  

## 2018-02-04 ENCOUNTER — Other Ambulatory Visit: Payer: Self-pay | Admitting: Family Medicine

## 2018-02-12 ENCOUNTER — Ambulatory Visit: Payer: PPO | Admitting: Gastroenterology

## 2018-02-12 ENCOUNTER — Other Ambulatory Visit: Payer: Self-pay

## 2018-02-12 ENCOUNTER — Encounter: Payer: Self-pay | Admitting: Gastroenterology

## 2018-02-12 ENCOUNTER — Ambulatory Visit (INDEPENDENT_AMBULATORY_CARE_PROVIDER_SITE_OTHER): Payer: PPO | Admitting: Gastroenterology

## 2018-02-12 VITALS — BP 115/79 | HR 59 | Ht 61.0 in | Wt 230.0 lb

## 2018-02-12 DIAGNOSIS — R634 Abnormal weight loss: Secondary | ICD-10-CM

## 2018-02-12 DIAGNOSIS — R1310 Dysphagia, unspecified: Secondary | ICD-10-CM

## 2018-02-12 NOTE — Progress Notes (Signed)
Primary Care Physician: Jerrol Banana., MD  Primary Gastroenterologist:  Dr. Lucilla Lame  Chief Complaint  Patient presents with  . Abdominal Pain  . Weight Loss    HPI: Susan House is a 62 y.o. female here For weight loss and abdominal pain.  The patient had a colonoscopy by me back in December 2017 with ischemic colitis seen in the distal colon.  The colonoscopy was not completed due to fear of perforation on the patient with acute ischemia.  The patient had been doing well recently until she started to notice that she has lost approximately 25 pounds without trying.  She also denies any black stools or bloody stools. The patient also reports that besides the weight loss she also has had some dysphagia with food getting stuck.  She reports that she chokes when she eats.  She has a family history of this and states that her father had the same thing and was found to have strictures of his esophagus that needed dilation.  Current Outpatient Medications  Medication Sig Dispense Refill  . ACCU-CHEK SOFTCLIX LANCETS lancets CHECK BLOOD SUGAR THREE TIMES DAILY 300 each 3  . amLODipine (NORVASC) 10 MG tablet Take 1 tablet (10 mg total) by mouth daily. 90 tablet 3  . atorvastatin (LIPITOR) 20 MG tablet Take 20 mg by mouth daily.    . Blood Glucose Monitoring Suppl (ACCU-CHEK AVIVA PLUS) w/Device KIT Check sugar three times daily DX E11.9-needs a meter 1 kit 0  . cloNIDine (CATAPRES) 0.3 MG tablet Take 1 tablet (0.3 mg total) by mouth 2 (two) times daily. 60 tablet 12  . clopidogrel (PLAVIX) 75 MG tablet Take 1 tablet (75 mg total) by mouth daily. 90 tablet 3  . diphenoxylate-atropine (LOMOTIL) 2.5-0.025 MG tablet 2 tablets initially and then 1 tablet every 6 hours as needed for diarrhea 60 tablet 1  . enalapril (VASOTEC) 20 MG tablet Take 1 tablet (20 mg total) by mouth 2 (two) times daily. 180 tablet 3  . enalapril (VASOTEC) 20 MG tablet Take 1 tablet (20 mg total) by mouth 2  (two) times daily. 180 tablet 2  . glucose blood (ACCU-CHEK AVIVA PLUS) test strip TEST BLOOD SUGAR THREE TIMES DAILY 300 each 3  . hydrALAZINE (APRESOLINE) 100 MG tablet Take 1 tablet (100 mg total) by mouth 2 (two) times daily. 60 tablet 11  . HYDROcodone-acetaminophen (NORCO) 10-325 MG tablet Take 1-2 tablets by mouth every 6 (six) hours as needed. 100 tablet 0  . insulin aspart protamine- aspart (NOVOLOG MIX 70/30) (70-30) 100 UNIT/ML injection Inject 10-35 Units into the skin 2 (two) times daily.     [employer] Devices (ACCU-CHEK SOFTCLIX) lancets Check sugar three times daily DX E11.9 100 each 3  . levETIRAcetam (KEPPRA) 500 MG tablet Take 1 tablet (500 mg total) by mouth 2 (two) times daily. 500 mg bid. 180 tablet 3  . meclizine (ANTIVERT) 25 MG tablet Take 1 tablet (25 mg total) by mouth 3 (three) times daily as needed for dizziness. 30 tablet 0  . metFORMIN (GLUCOPHAGE) 1000 MG tablet Take 1 tablet (1,000 mg total) by mouth 2 (two) times daily. 180 tablet 3  . metoprolol succinate (TOPROL-XL) 100 MG 24 hr tablet Take 1 tablet (100 mg total) by mouth daily. 90 tablet 3  . nitroGLYCERIN (NITROSTAT) 0.4 MG SL tablet Place 1 tablet (0.4 mg total) under the tongue every 5 (five) minutes as needed for chest pain. 25 tablet 0  . oxybutynin (  DITROPAN) 5 MG tablet Take 1 tablet (5 mg total) by mouth 2 (two) times daily. 180 tablet 3  . pantoprazole (PROTONIX) 40 MG tablet Take 1 tablet (40 mg total) by mouth daily. 90 tablet 3  . ranitidine (ZANTAC) 150 MG capsule Take 1 capsule (150 mg total) by mouth every evening. 30 capsule 12  . triamcinolone cream (KENALOG) 0.1 % Can use thin layer on affected areas 2x daily for one week at a time. Not for face. 30 g 0   No current facility-administered medications for this visit.     Allergies as of 02/12/2018 - Review Complete 02/12/2018  Allergen Reaction Noted  . Morphine and related Anaphylaxis 11/27/2012  . Fentanyl Other (See Comments) 11/11/2014  .  Reglan [metoclopramide]  10/02/2013  . Simvastatin Other (See Comments) 11/11/2014  . Betadine [povidone iodine] Rash 11/27/2012  . Iodine Rash 10/02/2013  . Tetracyclines & related Rash 10/02/2013    ROS:  General: Negative for anorexia, weight loss, fever, chills, fatigue, weakness. ENT: Negative for hoarseness, difficulty swallowing , nasal congestion. CV: Negative for chest pain, angina, palpitations, dyspnea on exertion, peripheral edema.  Respiratory: Negative for dyspnea at rest, dyspnea on exertion, cough, sputum, wheezing.  GI: See history of present illness. GU:  Negative for dysuria, hematuria, urinary incontinence, urinary frequency, nocturnal urination.  Endo: Negative for unusual weight change.    Physical Examination:   BP 115/79   Pulse (!) 59   Ht 5' 1"  (1.549 m)   Wt 230 lb (104.3 kg)   BMI 43.46 kg/m   General: Well-nourished, well-developed in no acute distress.  Eyes: No icterus. Conjunctivae pink. Mouth: Oropharyngeal mucosa moist and pink , no lesions erythema or exudate. Lungs: Clear to auscultation bilaterally. Non-labored. Heart: Regular rate and rhythm, no murmurs rubs or gallops.  Abdomen: Bowel sounds are normal, nontender, nondistended, no hepatosplenomegaly or masses, no abdominal bruits or hernia , no rebound or guarding.   Extremities: No lower extremity edema. No clubbing or deformities. Neuro: Alert and oriented x 3.  Hemiparesis from previous stroke.. Skin: Warm and dry, no jaundice.   Psych: Alert and cooperative, normal mood and affect.  Labs:    Imaging Studies: Dg Sacrum/coccyx  Result Date: 02/01/2018 CLINICAL DATA:  Tailbone pain for 2 weeks.  No known injury. EXAM: SACRUM AND COCCYX - 2+ VIEW COMPARISON:  Lumbar spine imaging 10/03/2017 FINDINGS: Diffuse osteopenia. No fracture. Postoperative changes in the lower lumbar spine from posterior fusion at L4-5. IMPRESSION: No acute bony abnormality. Electronically Signed   By: Rolm Baptise M.D.   On: 02/01/2018 09:49    Assessment and Plan:   Susan House is a 62 y.o. y/o female who has a report of weight loss and dysphagia. The patient's last colonoscopy was incomplete since she had ischemic changes in the lower colon.  I have discussed risks & benefits which include, but are not limited to, bleeding, infection, perforation & drug reaction.  The patient agrees with this plan & written consent will be obtained.       Lucilla Lame, MD. [place] [facility]   Note: This dictation was prepared with Dragon dictation along with smaller phrase technology. Any transcriptional errors that result from this process are unintentional.

## 2018-02-13 ENCOUNTER — Other Ambulatory Visit: Payer: Self-pay

## 2018-02-13 DIAGNOSIS — R634 Abnormal weight loss: Secondary | ICD-10-CM

## 2018-02-13 DIAGNOSIS — R1310 Dysphagia, unspecified: Secondary | ICD-10-CM

## 2018-02-14 ENCOUNTER — Ambulatory Visit (INDEPENDENT_AMBULATORY_CARE_PROVIDER_SITE_OTHER): Payer: PPO | Admitting: Family Medicine

## 2018-02-14 ENCOUNTER — Encounter: Payer: Self-pay | Admitting: Family Medicine

## 2018-02-14 ENCOUNTER — Ambulatory Visit: Payer: PPO

## 2018-02-14 VITALS — BP 126/58 | HR 62 | Temp 98.3°F | Resp 20 | Wt 230.0 lb

## 2018-02-14 DIAGNOSIS — I6389 Other cerebral infarction: Secondary | ICD-10-CM | POA: Diagnosis not present

## 2018-02-14 DIAGNOSIS — M545 Low back pain: Secondary | ICD-10-CM | POA: Diagnosis not present

## 2018-02-14 DIAGNOSIS — E118 Type 2 diabetes mellitus with unspecified complications: Secondary | ICD-10-CM | POA: Diagnosis not present

## 2018-02-14 DIAGNOSIS — G8929 Other chronic pain: Secondary | ICD-10-CM

## 2018-02-14 DIAGNOSIS — Z794 Long term (current) use of insulin: Secondary | ICD-10-CM

## 2018-02-14 DIAGNOSIS — B372 Candidiasis of skin and nail: Secondary | ICD-10-CM

## 2018-02-14 LAB — POCT GLYCOSYLATED HEMOGLOBIN (HGB A1C): Hemoglobin A1C: 10.8 % — AB (ref 4.0–5.6)

## 2018-02-14 MED ORDER — CYCLOBENZAPRINE HCL 5 MG PO TABS: 5.0000 mg | ORAL_TABLET | Freq: Three times a day (TID) | ORAL | 1 refills | Status: DC | PRN

## 2018-02-14 MED ORDER — NYSTATIN 100000 UNIT/GM EX CREA: 1.0000 "application " | TOPICAL_CREAM | Freq: Two times a day (BID) | CUTANEOUS | 0 refills | Status: DC

## 2018-02-14 NOTE — Patient Instructions (Signed)
Increase Insulin by 2 units every 3 days to get FBS less than 200.

## 2018-02-14 NOTE — Progress Notes (Signed)
Patient: Susan House Female    DOB: February 22, 1956   62 y.o.   MRN: [id number] Visit Date: 02/14/2018  Today's Provider: Wilhemena Durie, MD   Chief Complaint  Patient presents with  . Diabetes  . Hypertension  . Hyperlipidemia   Subjective:    HPI  Diabetes Mellitus Type II, Follow-up:   Lab Results  Component Value Date   HGBA1C 10.8 (A) 02/14/2018   HGBA1C 7.8 (H) 10/04/2017   HGBA1C 8.4 09/06/2017    Last seen for diabetes 4 months ago.  Management since then includes no changes. She reports good compliance with treatment. She is not having side effects.  Current symptoms include hypoglycemia , nausea and paresthesia of the feet and have been unchanged. Home blood sugar records: trend: fluctuating a bit, can be as high as 300.  Episodes of hypoglycemia? yes - patient reports that as soon as she takes her insulin her BS drops too low. She takes around 31 units.   Most Recent Eye Exam: due Weight trend: stable Prior visit with dietician: no Current diet: well balanced Current exercise: no regular exercise   Chronic Low back pain and pain around coccyx.  Pertinent Labs:    Component Value Date/Time   CHOL 97 10/04/2017 0458   CHOL 110 01/18/2014 0059   TRIG 51 10/04/2017 0458   TRIG 122 01/18/2014 0059   HDL 48 10/04/2017 0458   HDL 35 (L) 01/18/2014 0059   LDLCALC 39 10/04/2017 0458   LDLCALC 51 01/18/2014 0059   CREATININE 1.09 (H) 12/25/2017 1600   CREATININE 1.29 06/17/2014 0744    Wt Readings from Last 3 Encounters:  02/14/18 230 lb (104.3 kg)  02/12/18 230 lb (104.3 kg)  01/31/18 234 lb (106.1 kg)      Allergies  Allergen Reactions  . Morphine And Related Anaphylaxis  . Fentanyl Other (See Comments)    Unknown reaction.  . Reglan [Metoclopramide]     Other reaction(s): Unknown Elevated BP  . Simvastatin Other (See Comments)    Muscle pain  . Betadine [Povidone Iodine] Rash  . Iodine Rash    Other reaction(s): Unknown    . Tetracyclines & Related Rash    Other reaction(s): Unknown     Current Outpatient Medications:  .  enalapril (VASOTEC) 20 MG tablet, Take 1 tablet (20 mg total) by mouth 2 (two) times daily., Disp: 180 tablet, Rfl: 2 .  glucose blood (ACCU-CHEK AVIVA PLUS) test strip, TEST BLOOD SUGAR THREE TIMES DAILY, Disp: 300 each, Rfl: 3 .  hydrALAZINE (APRESOLINE) 100 MG tablet, Take 1 tablet (100 mg total) by mouth 2 (two) times daily., Disp: 60 tablet, Rfl: 11 .  HYDROcodone-acetaminophen (NORCO) 10-325 MG tablet, Take 1-2 tablets by mouth every 6 (six) hours as needed., Disp: 100 tablet, Rfl: 0 .  insulin aspart protamine- aspart (NOVOLOG MIX 70/30) (70-30) 100 UNIT/ML injection, Inject 10-35 Units into the skin 2 (two) times daily. , Disp: , Rfl:  .  Lancet Devices (ACCU-CHEK SOFTCLIX) lancets, Check sugar three times daily DX E11.9, Disp: 100 each, Rfl: 3 .  levETIRAcetam (KEPPRA) 500 MG tablet, Take 1 tablet (500 mg total) by mouth 2 (two) times daily. 500 mg bid., Disp: 180 tablet, Rfl: 3 .  meclizine (ANTIVERT) 25 MG tablet, Take 1 tablet (25 mg total) by mouth 3 (three) times daily as needed for dizziness., Disp: 30 tablet, Rfl: 0 .  metFORMIN (GLUCOPHAGE) 1000 MG tablet, Take 1 tablet (1,000 mg total)  by mouth 2 (two) times daily., Disp: 180 tablet, Rfl: 3 .  metoprolol succinate (TOPROL-XL) 100 MG 24 hr tablet, Take 1 tablet (100 mg total) by mouth daily., Disp: 90 tablet, Rfl: 3 .  nitroGLYCERIN (NITROSTAT) 0.4 MG SL tablet, Place 1 tablet (0.4 mg total) under the tongue every 5 (five) minutes as needed for chest pain., Disp: 25 tablet, Rfl: 0 .  oxybutynin (DITROPAN) 5 MG tablet, Take 1 tablet (5 mg total) by mouth 2 (two) times daily., Disp: 180 tablet, Rfl: 3 .  pantoprazole (PROTONIX) 40 MG tablet, Take 1 tablet (40 mg total) by mouth daily., Disp: 90 tablet, Rfl: 3 .  ranitidine (ZANTAC) 150 MG capsule, Take 1 capsule (150 mg total) by mouth every evening., Disp: 30 capsule, Rfl: 12 .   triamcinolone cream (KENALOG) 0.1 %, Can use thin layer on affected areas 2x daily for one week at a time. Not for face., Disp: 30 g, Rfl: 0 .  ACCU-CHEK SOFTCLIX LANCETS lancets, CHECK BLOOD SUGAR THREE TIMES DAILY, Disp: 300 each, Rfl: 3 .  amLODipine (NORVASC) 10 MG tablet, Take 1 tablet (10 mg total) by mouth daily., Disp: 90 tablet, Rfl: 3 .  atorvastatin (LIPITOR) 20 MG tablet, Take 20 mg by mouth daily., Disp: , Rfl:  .  Blood Glucose Monitoring Suppl (ACCU-CHEK AVIVA PLUS) w/Device KIT, Check sugar three times daily DX E11.9-needs a meter, Disp: 1 kit, Rfl: 0 .  cloNIDine (CATAPRES) 0.3 MG tablet, Take 1 tablet (0.3 mg total) by mouth 2 (two) times daily., Disp: 60 tablet, Rfl: 12 .  clopidogrel (PLAVIX) 75 MG tablet, Take 1 tablet (75 mg total) by mouth daily., Disp: 90 tablet, Rfl: 3 .  diphenoxylate-atropine (LOMOTIL) 2.5-0.025 MG tablet, 2 tablets initially and then 1 tablet every 6 hours as needed for diarrhea, Disp: 60 tablet, Rfl: 1 .  enalapril (VASOTEC) 20 MG tablet, Take 1 tablet (20 mg total) by mouth 2 (two) times daily., Disp: 180 tablet, Rfl: 3  Review of Systems  Constitutional: Positive for fatigue. Negative for activity change, appetite change, chills, diaphoresis, fever and unexpected weight change.  Respiratory: Negative for cough and shortness of breath.   Cardiovascular: Negative for chest pain and leg swelling.  Gastrointestinal: Negative for abdominal pain, constipation, diarrhea and nausea.  Endocrine: Positive for polydipsia and polyphagia. Negative for cold intolerance and heat intolerance.  Genitourinary: Negative.   Musculoskeletal: Positive for arthralgias, back pain, joint swelling and myalgias.  Neurological: Positive for weakness and light-headedness. Negative for dizziness, tremors, syncope, numbness and headaches.  Psychiatric/Behavioral: Negative for self-injury, sleep disturbance and suicidal ideas. The patient is not nervous/anxious.     Social  History   Tobacco Use  . Smoking status: Never Smoker  . Smokeless tobacco: Never Used  Substance Use Topics  . Alcohol use: No   Objective:   BP (!) 126/58 (BP Location: Right Arm, Patient Position: Sitting, Cuff Size: Normal)   Pulse 62   Temp 98.3 F (36.8 C)   Resp 20   Wt 230 lb (104.3 kg)   SpO2 98%   BMI 43.46 kg/m  Vitals:   02/14/18 1519  BP: (!) 126/58  Pulse: 62  Resp: 20  Temp: 98.3 F (36.8 C)  SpO2: 98%  Weight: 230 lb (104.3 kg)     Physical Exam  Constitutional: She is oriented to person, place, and time. She appears well-developed and well-nourished.  HENT:  Head: Normocephalic and atraumatic.  Right Ear: External ear normal.  Left Ear: External ear  normal.  Nose: Nose normal.  Mouth/Throat: Oropharynx is clear and moist.  Eyes: Conjunctivae are normal. No scleral icterus.  Neck: No thyromegaly present.  Cardiovascular: Normal rate, regular rhythm and normal heart sounds.  Pulmonary/Chest: Effort normal and breath sounds normal.  Abdominal: Soft.  Musculoskeletal: She exhibits no edema.  Neurological: She is alert and oriented to person, place, and time.  Left hemiparesis.  Skin: Skin is warm and dry.  Small dimple at coccyx. Mild yeast irritationof buttocks.  Psychiatric: She has a normal mood and affect. Her behavior is normal. Judgment and thought content normal.        Assessment & Plan:     1. Type 2 diabetes mellitus with complication, with long-term current use of insulin [facility]) May need referral to Endocrinology. Increase insulin by 2 units every 3 days untile FBS below 200. - POCT glycosylated hemoglobin (Hb A1C)--10.8  2. Yeast dermatitis  - nystatin cream (MYCOSTATIN); Apply 1 application topically 2 (two) times daily.  Dispense: 30 g; Refill: 0  3. Chronic low back pain, unspecified back pain laterality, with sciatica presence unspecified  - cyclobenzaprine (FLEXERIL) 5 MG tablet; Take 1 tablet (5 mg total) by mouth 3  (three) times daily as needed for muscle spasms.  Dispense: 30 tablet; Refill: 1  4. Cerebrovascular accident (CVA) due to other mechanism ([place]) 5.Obesity      I have done the exam and reviewed the above chart and it is accurate to the best of my knowledge. Development worker, community has been used in this note in any air is in the dictation or transcription are unintentional.  Wilhemena Durie, MD  [place]

## 2018-02-22 DIAGNOSIS — E113553 Type 2 diabetes mellitus with stable proliferative diabetic retinopathy, bilateral: Secondary | ICD-10-CM | POA: Diagnosis not present

## 2018-02-23 ENCOUNTER — Encounter: Payer: Self-pay | Admitting: Family Medicine

## 2018-02-23 ENCOUNTER — Ambulatory Visit (INDEPENDENT_AMBULATORY_CARE_PROVIDER_SITE_OTHER): Payer: PPO | Admitting: Family Medicine

## 2018-02-23 VITALS — BP 118/70 | HR 76 | Temp 98.3°F | Resp 16

## 2018-02-23 DIAGNOSIS — S90421A Blister (nonthermal), right great toe, initial encounter: Secondary | ICD-10-CM

## 2018-02-23 MED ORDER — AMOXICILLIN-POT CLAVULANATE 875-125 MG PO TABS: 1.0000 | ORAL_TABLET | Freq: Two times a day (BID) | ORAL | 0 refills | Status: AC

## 2018-02-23 NOTE — Progress Notes (Signed)
Patient: Susan House Female    DOB: [birth year]   62 y.o.   MRN: [id number] Visit Date: 02/23/2018  Today's Provider: Lelon Huh, MD   Chief Complaint  Patient presents with  . Toe Injury   Subjective:    HPI  Patient has a small tear on her right big toe. She noticed it yesterday. Patient doesn't know how she injured her toe. Patient wanted to have it looked out.   Allergies  Allergen Reactions  . Morphine And Related Anaphylaxis  . Fentanyl Other (See Comments)    Unknown reaction.  . Reglan [Metoclopramide]     Other reaction(s): Unknown Elevated BP  . Simvastatin Other (See Comments)    Muscle pain  . Betadine [Povidone Iodine] Rash  . Iodine Rash    Other reaction(s): Unknown  . Tetracyclines & Related Rash    Other reaction(s): Unknown     Current Outpatient Medications:  .  ACCU-CHEK SOFTCLIX LANCETS lancets, CHECK BLOOD SUGAR THREE TIMES DAILY, Disp: 300 each, Rfl: 3 .  amLODipine (NORVASC) 10 MG tablet, Take 1 tablet (10 mg total) by mouth daily., Disp: 90 tablet, Rfl: 3 .  atorvastatin (LIPITOR) 20 MG tablet, Take 20 mg by mouth daily., Disp: , Rfl:  .  Blood Glucose Monitoring Suppl (ACCU-CHEK AVIVA PLUS) w/Device KIT, Check sugar three times daily DX E11.9-needs a meter, Disp: 1 kit, Rfl: 0 .  cloNIDine (CATAPRES) 0.3 MG tablet, Take 1 tablet (0.3 mg total) by mouth 2 (two) times daily., Disp: 60 tablet, Rfl: 12 .  clopidogrel (PLAVIX) 75 MG tablet, Take 1 tablet (75 mg total) by mouth daily., Disp: 90 tablet, Rfl: 3 .  cyclobenzaprine (FLEXERIL) 5 MG tablet, Take 1 tablet (5 mg total) by mouth 3 (three) times daily as needed for muscle spasms., Disp: 30 tablet, Rfl: 1 .  diphenoxylate-atropine (LOMOTIL) 2.5-0.025 MG tablet, 2 tablets initially and then 1 tablet every 6 hours as needed for diarrhea, Disp: 60 tablet, Rfl: 1 .  enalapril (VASOTEC) 20 MG tablet, Take 1 tablet (20 mg total) by mouth 2 (two) times daily., Disp: 180 tablet, Rfl:  2 .  glucose blood (ACCU-CHEK AVIVA PLUS) test strip, TEST BLOOD SUGAR THREE TIMES DAILY, Disp: 300 each, Rfl: 3 .  hydrALAZINE (APRESOLINE) 100 MG tablet, Take 1 tablet (100 mg total) by mouth 2 (two) times daily., Disp: 60 tablet, Rfl: 11 .  HYDROcodone-acetaminophen (NORCO) 10-325 MG tablet, Take 1-2 tablets by mouth every 6 (six) hours as needed., Disp: 100 tablet, Rfl: 0 .  insulin aspart protamine- aspart (NOVOLOG MIX 70/30) (70-30) 100 UNIT/ML injection, Inject 10-35 Units into the skin 2 (two) times daily. , Disp: , Rfl:  .  Lancet Devices (ACCU-CHEK SOFTCLIX) lancets, Check sugar three times daily DX E11.9, Disp: 100 each, Rfl: 3 .  levETIRAcetam (KEPPRA) 500 MG tablet, Take 1 tablet (500 mg total) by mouth 2 (two) times daily. 500 mg bid., Disp: 180 tablet, Rfl: 3 .  meclizine (ANTIVERT) 25 MG tablet, Take 1 tablet (25 mg total) by mouth 3 (three) times daily as needed for dizziness., Disp: 30 tablet, Rfl: 0 .  metFORMIN (GLUCOPHAGE) 1000 MG tablet, Take 1 tablet (1,000 mg total) by mouth 2 (two) times daily., Disp: 180 tablet, Rfl: 3 .  metoprolol succinate (TOPROL-XL) 100 MG 24 hr tablet, Take 1 tablet (100 mg total) by mouth daily., Disp: 90 tablet, Rfl: 3 .  nitroGLYCERIN (NITROSTAT) 0.4 MG SL tablet, Place 1 tablet (0.4 mg  total) under the tongue every 5 (five) minutes as needed for chest pain., Disp: 25 tablet, Rfl: 0 .  nystatin cream (MYCOSTATIN), Apply 1 application topically 2 (two) times daily., Disp: 30 g, Rfl: 0 .  oxybutynin (DITROPAN) 5 MG tablet, Take 1 tablet (5 mg total) by mouth 2 (two) times daily., Disp: 180 tablet, Rfl: 3 .  pantoprazole (PROTONIX) 40 MG tablet, Take 1 tablet (40 mg total) by mouth daily., Disp: 90 tablet, Rfl: 3 .  ranitidine (ZANTAC) 150 MG capsule, Take 1 capsule (150 mg total) by mouth every evening., Disp: 30 capsule, Rfl: 12 .  triamcinolone cream (KENALOG) 0.1 %, Can use thin layer on affected areas 2x daily for one week at a time. Not for face.,  Disp: 30 g, Rfl: 0 .  enalapril (VASOTEC) 20 MG tablet, Take 1 tablet (20 mg total) by mouth 2 (two) times daily. (Patient not taking: Reported on 02/23/2018), Disp: 180 tablet, Rfl: 3  Review of Systems  Constitutional: Negative for appetite change, chills, fatigue and fever.  Respiratory: Negative for chest tightness and shortness of breath.   Cardiovascular: Negative for chest pain and palpitations.  Gastrointestinal: Negative for abdominal pain, nausea and vomiting.  Neurological: Negative for dizziness and weakness.    Social History   Tobacco Use  . Smoking status: Never Smoker  . Smokeless tobacco: Never Used  Substance Use Topics  . Alcohol use: No   Objective:   BP 118/70 (BP Location: Right Arm, Patient Position: Sitting, Cuff Size: Large)   Pulse 76   Temp 98.3 F (36.8 C) (Oral)   Resp 16   SpO2 97%  Vitals:   02/23/18 1017  BP: 118/70  Pulse: 76  Resp: 16  Temp: 98.3 F (36.8 C)  TempSrc: Oral  SpO2: 97%     Physical Exam  General appearance: alert, well developed, well nourished, cooperative and in no distress Head: Normocephalic, without obvious abnormality, atraumatic Respiratory: Respirations even and unlabored, normal respiratory rate Extremities: No gross deformities Skin: About 1cm blister with faint erythema medial aspect of right great toe. No discharge.      Assessment & Plan:     1. Blister of great toe of right foot, initial encounter Some signs of early infection. Consider h/o diabetes and vascular disease cover with amoxicillin-clavulanate (AUGMENTIN) 875-125 MG tablet; Take 1 tablet by mouth 2 (two) times daily for 7 days.  Dispense: 14 tablet; Refill: 0  Counseled on appropriate wound care.  Call if symptoms change or if not rapidly improving.         Lelon Huh, MD  [facility]

## 2018-02-27 ENCOUNTER — Telehealth: Payer: Self-pay | Admitting: Gastroenterology

## 2018-02-27 NOTE — Telephone Encounter (Signed)
Tried contacting pt to reschedule colonoscopy but vm was not set up.

## 2018-02-27 NOTE — Telephone Encounter (Signed)
Pt would like to speak to Ginger to r/s her procedure due to being out of town until the follow week please call pt

## 2018-03-01 NOTE — Telephone Encounter (Signed)
Pt has rescheduled colonoscopy to 03/26/18.

## 2018-03-15 IMAGING — CR DG LUMBAR SPINE COMPLETE 4+V
1 series · 6 of 6 positions shown · non-contrast
Comparison: November 10, 2014.

CLINICAL DATA: Acute onset pain 1 day prior

EXAM:
LUMBAR SPINE - COMPLETE 4+ VIEW

[Series 1: dg lumbar spine complete 4 +v · 0.14mm/px · 6 of 6 slices shown]
[im 1/6]
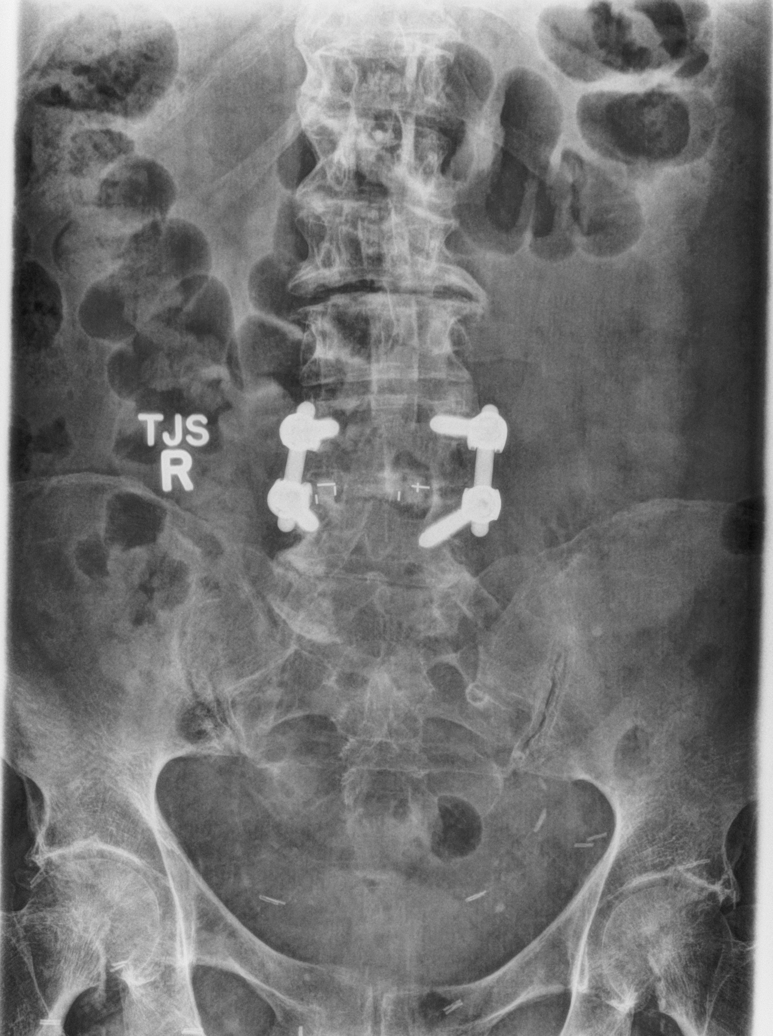
[im 2/6]
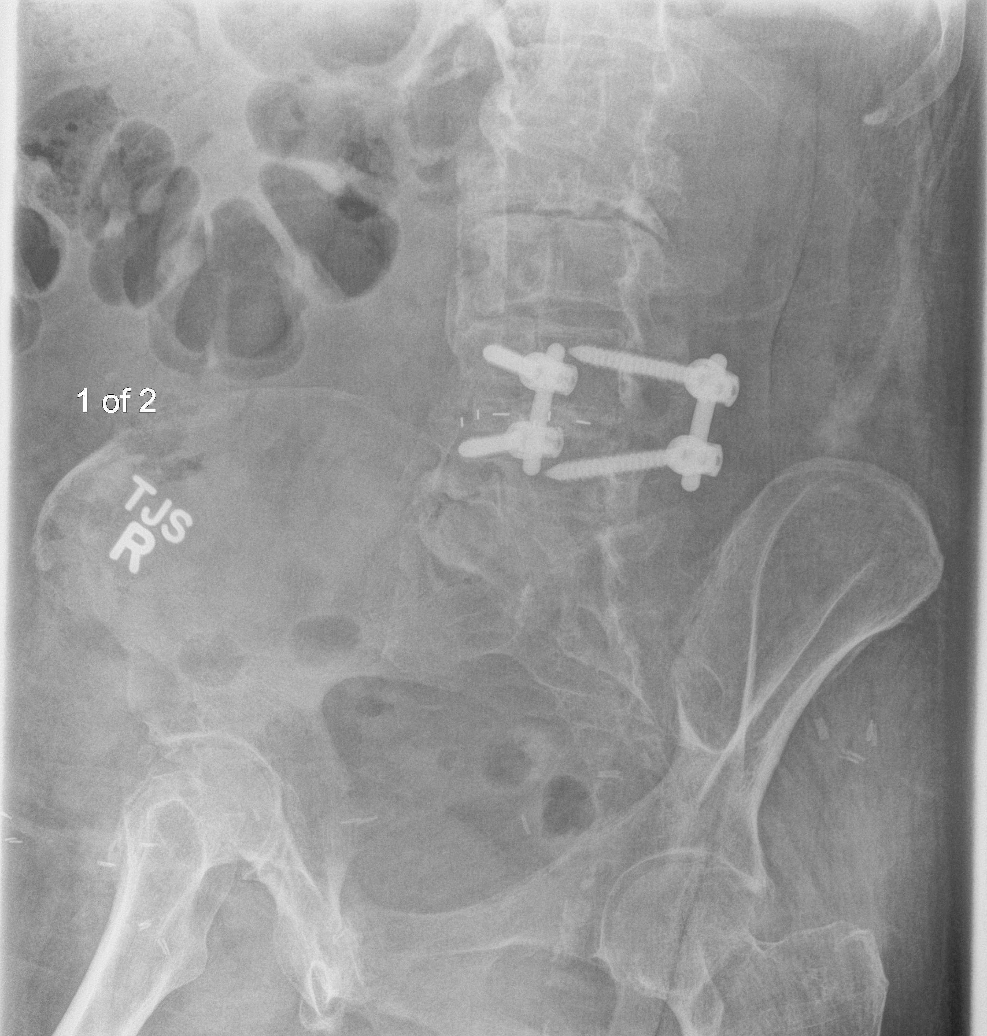
[im 3/6]
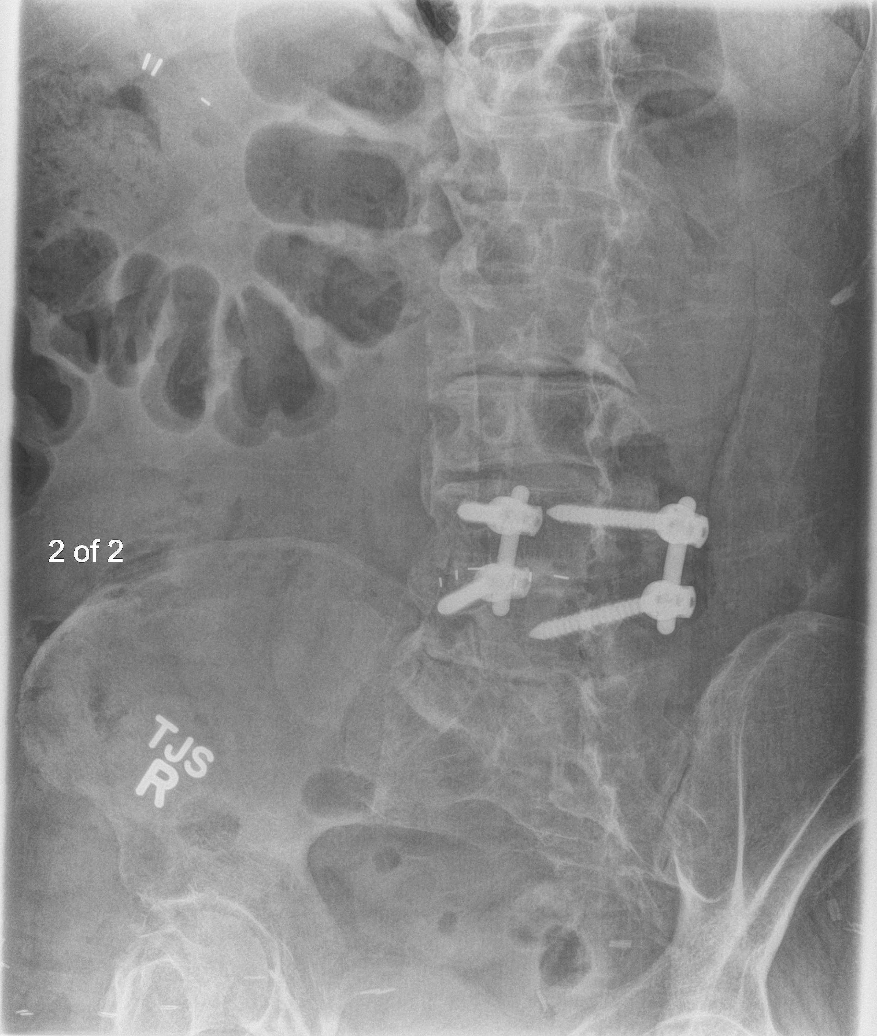
[im 4/6]
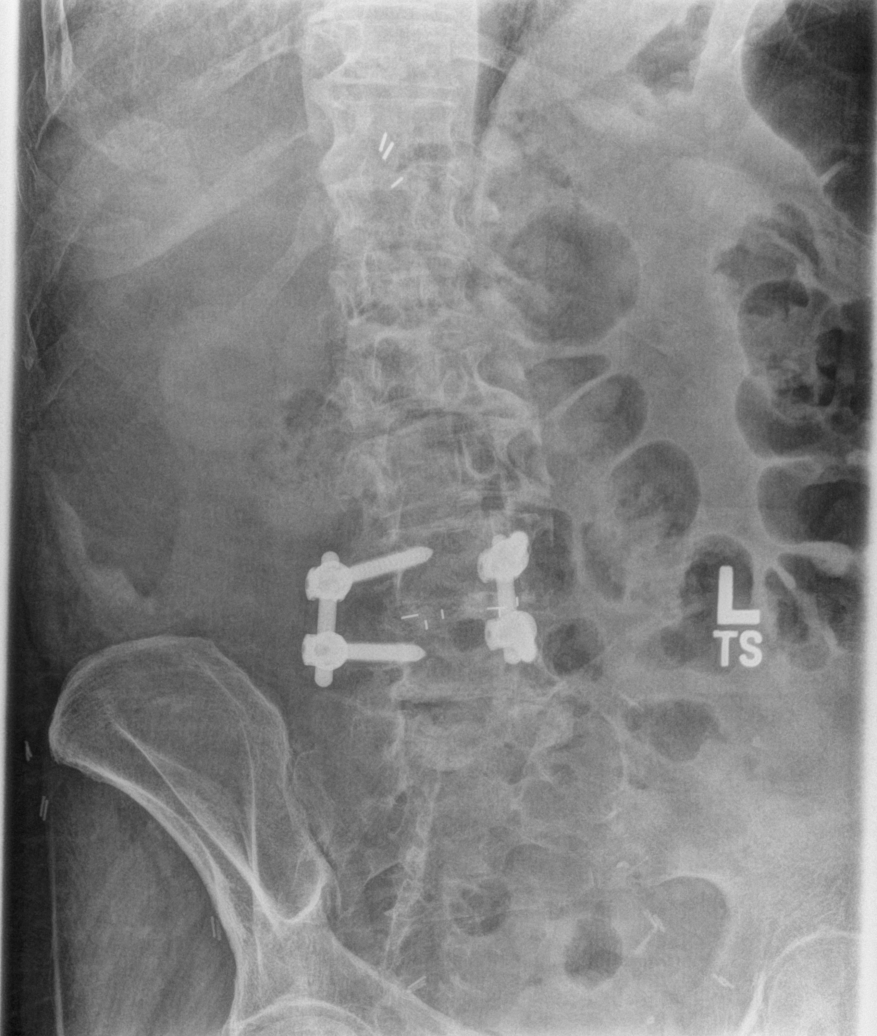
[im 5/6]
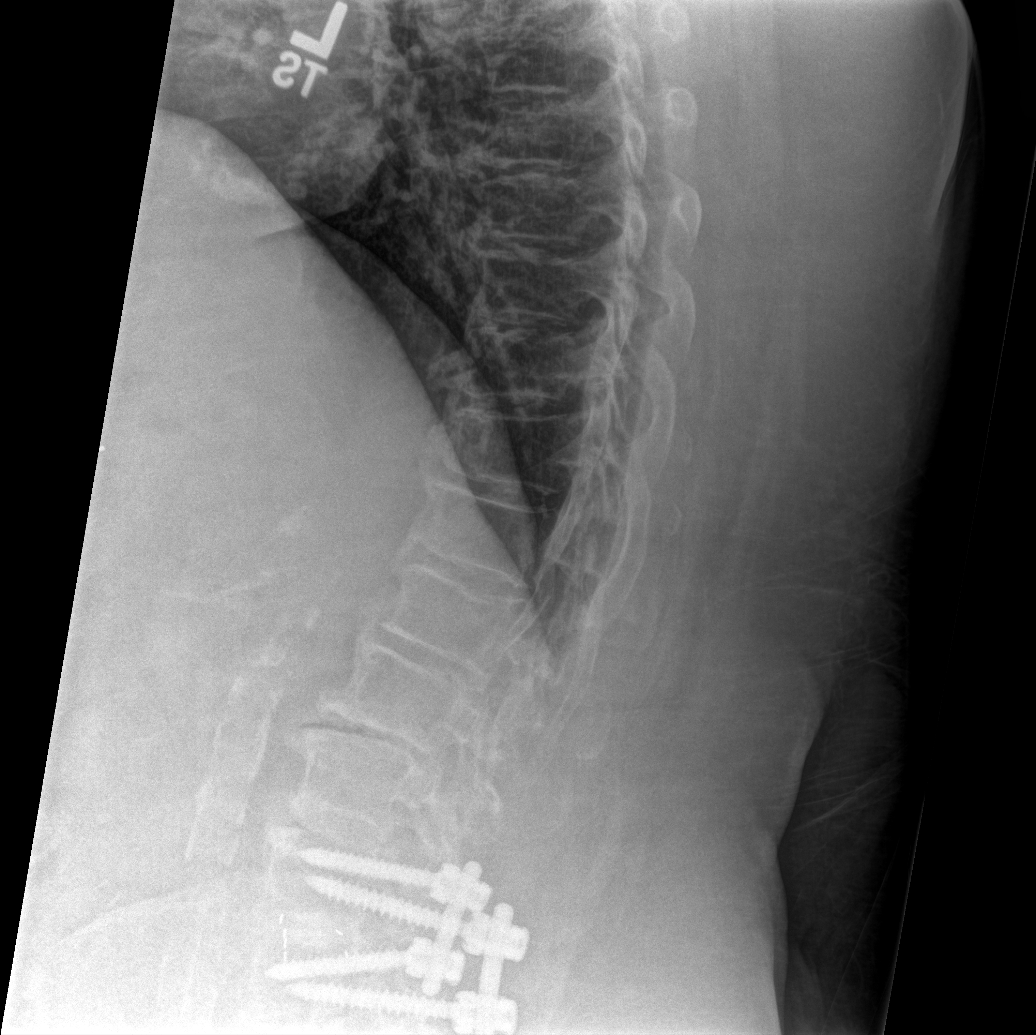
[im 6/6]
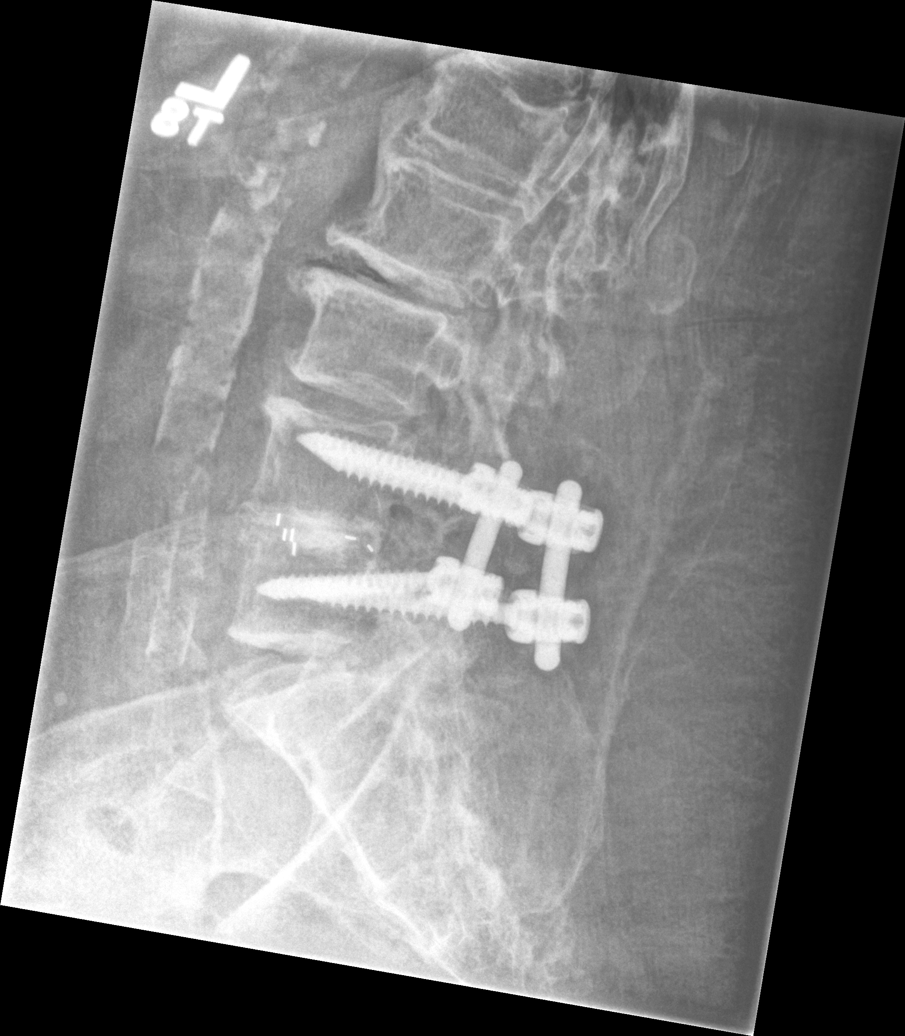

[6 of 6 positions shown; findings below may reference images not displayed]

FINDINGS: Frontal, lateral, spot lumbosacral lateral, and bilateral oblique
views were obtained. There are 5 non-rib-bearing lumbar type
vertebral bodies. There is posterior screw and plate fixation at L5
and S1. There is a disc spacer at L4-5. The screw and plate fixation
devices appear intact. There is no acute fracture or
spondylolisthesis. There is disc space narrowing throughout the
lumbar spine at all levels, stable. There is also disc space
narrowing at several levels in the lower thoracic region. These
changes are stable. There is facet osteoarthritic change at L3-4,
L4-5, and L5-S1 bilaterally. There is atherosclerotic calcification
in the aorta.
IMPRESSION: Postoperative change at L4 and L5 with support hardware intact. No
acute fracture or spondylolisthesis. Stable multilevel arthropathy.
There is aortic atherosclerosis.

## 2018-03-19 ENCOUNTER — Ambulatory Visit (INDEPENDENT_AMBULATORY_CARE_PROVIDER_SITE_OTHER): Payer: PPO | Admitting: Family Medicine

## 2018-03-19 VITALS — BP 170/80 | HR 68 | Temp 98.1°F | Resp 14

## 2018-03-19 DIAGNOSIS — L03119 Cellulitis of unspecified part of limb: Secondary | ICD-10-CM

## 2018-03-19 DIAGNOSIS — L02619 Cutaneous abscess of unspecified foot: Secondary | ICD-10-CM | POA: Diagnosis not present

## 2018-03-19 DIAGNOSIS — N390 Urinary tract infection, site not specified: Secondary | ICD-10-CM | POA: Diagnosis not present

## 2018-03-19 MED ORDER — CEPHALEXIN 500 MG PO CAPS: 500.0000 mg | ORAL_CAPSULE | Freq: Two times a day (BID) | ORAL | 0 refills | Status: DC
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
## 2018-03-21 LAB — URINE CULTURE

## 2018-03-21 LAB — SPECIMEN STATUS REPORT

## 2018-03-21 NOTE — Progress Notes (Signed)
Advised  ED 

## 2018-03-26 ENCOUNTER — Encounter
Admission: RE | Disposition: A | Discharge status: Home / Self Care | Payer: Self-pay | Source: Ambulatory Visit | Attending: Gastroenterology

## 2018-03-26 ENCOUNTER — Ambulatory Visit: Payer: PPO | Admitting: Certified Registered"

## 2018-03-26 ENCOUNTER — Ambulatory Visit
Admission: RE | Admit: 2018-03-26 | Discharge: 2018-03-26 | Disposition: A | Discharge status: Home / Self Care | Payer: PPO | Source: Ambulatory Visit | Attending: Gastroenterology | Admitting: Gastroenterology

## 2018-03-26 ENCOUNTER — Encounter: Payer: Self-pay | Admitting: Emergency Medicine

## 2018-03-26 DIAGNOSIS — K640 First degree hemorrhoids: Secondary | ICD-10-CM | POA: Insufficient documentation

## 2018-03-26 DIAGNOSIS — K2900 Acute gastritis without bleeding: Secondary | ICD-10-CM

## 2018-03-26 DIAGNOSIS — K317 Polyp of stomach and duodenum: Secondary | ICD-10-CM | POA: Diagnosis not present

## 2018-03-26 DIAGNOSIS — F329 Major depressive disorder, single episode, unspecified: Secondary | ICD-10-CM | POA: Insufficient documentation

## 2018-03-26 DIAGNOSIS — R634 Abnormal weight loss: Secondary | ICD-10-CM

## 2018-03-26 DIAGNOSIS — E785 Hyperlipidemia, unspecified: Secondary | ICD-10-CM | POA: Insufficient documentation

## 2018-03-26 DIAGNOSIS — K2950 Unspecified chronic gastritis without bleeding: Secondary | ICD-10-CM | POA: Diagnosis not present

## 2018-03-26 DIAGNOSIS — N183 Chronic kidney disease, stage 3 (moderate): Secondary | ICD-10-CM | POA: Diagnosis not present

## 2018-03-26 DIAGNOSIS — K589 Irritable bowel syndrome without diarrhea: Secondary | ICD-10-CM | POA: Diagnosis not present

## 2018-03-26 DIAGNOSIS — Z794 Long term (current) use of insulin: Secondary | ICD-10-CM | POA: Diagnosis not present

## 2018-03-26 DIAGNOSIS — Z79899 Other long term (current) drug therapy: Secondary | ICD-10-CM | POA: Diagnosis not present

## 2018-03-26 DIAGNOSIS — E119 Type 2 diabetes mellitus without complications: Secondary | ICD-10-CM | POA: Diagnosis not present

## 2018-03-26 DIAGNOSIS — K210 Gastro-esophageal reflux disease with esophagitis: Secondary | ICD-10-CM | POA: Diagnosis not present

## 2018-03-26 DIAGNOSIS — I10 Essential (primary) hypertension: Secondary | ICD-10-CM | POA: Diagnosis not present

## 2018-03-26 DIAGNOSIS — K219 Gastro-esophageal reflux disease without esophagitis: Secondary | ICD-10-CM | POA: Insufficient documentation

## 2018-03-26 DIAGNOSIS — Z8673 Personal history of transient ischemic attack (TIA), and cerebral infarction without residual deficits: Secondary | ICD-10-CM | POA: Insufficient documentation

## 2018-03-26 DIAGNOSIS — K2960 Other gastritis without bleeding: Secondary | ICD-10-CM | POA: Diagnosis not present

## 2018-03-26 DIAGNOSIS — Z6841 Body Mass Index (BMI) 40.0 and over, adult: Secondary | ICD-10-CM | POA: Insufficient documentation

## 2018-03-26 DIAGNOSIS — K2970 Gastritis, unspecified, without bleeding: Secondary | ICD-10-CM | POA: Diagnosis not present

## 2018-03-26 DIAGNOSIS — R1310 Dysphagia, unspecified: Secondary | ICD-10-CM | POA: Diagnosis not present

## 2018-03-26 DIAGNOSIS — E1122 Type 2 diabetes mellitus with diabetic chronic kidney disease: Secondary | ICD-10-CM | POA: Diagnosis not present

## 2018-03-26 DIAGNOSIS — I129 Hypertensive chronic kidney disease with stage 1 through stage 4 chronic kidney disease, or unspecified chronic kidney disease: Secondary | ICD-10-CM | POA: Diagnosis not present

## 2018-03-26 HISTORY — PX: COLONOSCOPY WITH PROPOFOL: SHX5780

## 2018-03-26 HISTORY — PX: ESOPHAGOGASTRODUODENOSCOPY (EGD) WITH PROPOFOL: SHX5813

## 2018-03-26 SURGERY — COLONOSCOPY WITH PROPOFOL
Anesthesia: General

## 2018-03-26 MED ORDER — MIDAZOLAM HCL 2 MG/2ML IJ SOLN
INTRAMUSCULAR | Status: AC
  Filled 2018-03-26: qty 2

## 2018-03-26 MED ORDER — LIDOCAINE HCL (CARDIAC) PF 100 MG/5ML IV SOSY
PREFILLED_SYRINGE | INTRAVENOUS | Status: DC | PRN
  Administered 2018-03-26: 100 mg via INTRAVENOUS

## 2018-03-26 MED ORDER — PROPOFOL 500 MG/50ML IV EMUL
INTRAVENOUS | Status: DC | PRN
  Administered 2018-03-26: 100 ug/kg/min via INTRAVENOUS

## 2018-03-26 MED ORDER — SODIUM CHLORIDE 0.9 % IV SOLN
INTRAVENOUS | Status: DC
  Administered 2018-03-26: 1000 mL via INTRAVENOUS

## 2018-03-26 MED ORDER — MIDAZOLAM HCL 2 MG/2ML IJ SOLN
INTRAMUSCULAR | Status: DC | PRN
  Administered 2018-03-26: 2 mg via INTRAVENOUS

## 2018-03-26 MED ORDER — ONDANSETRON HCL 4 MG/2ML IJ SOLN: 4.0000 mg | Freq: Once | INTRAMUSCULAR | Status: DC | PRN

## 2018-03-26 MED ORDER — LIDOCAINE HCL (PF) 2 % IJ SOLN
INTRAMUSCULAR | Status: AC
  Filled 2018-03-26: qty 10

## 2018-03-26 MED ORDER — PROPOFOL 10 MG/ML IV BOLUS
INTRAVENOUS | Status: DC | PRN
  Administered 2018-03-26: 80 mg via INTRAVENOUS
  Administered 2018-03-26: 20 mg via INTRAVENOUS

## 2018-03-26 NOTE — Op Note (Signed)
[facility] Patient Name: Susan House Procedure Date: 03/26/2018 11:52 AM MRN: [id number] Account #: [id number] Date of Birth: 1955/09/15 Admit Type: Outpatient Age: 62 Room: [facility] ENDO ROOM 4 Gender: Female Note Status: Finalized Procedure:            Colonoscopy Indications:          Weight loss Providers:            Lucilla Lame MD, MD Referring MD:         Janine Ores. Rosanna Randy, MD (Referring MD) Medicines:            Propofol per Anesthesia Complications:        No immediate complications. Procedure:            Pre-Anesthesia Assessment:                       - Prior to the procedure, a History and Physical was                        performed, and patient medications and allergies were                        reviewed. The patient's tolerance of previous                        anesthesia was also reviewed. The risks and benefits of                        the procedure and the sedation options and risks were                        discussed with the patient. All questions were                        answered, and informed consent was obtained. Prior                        Anticoagulants: The patient has taken no previous                        anticoagulant or antiplatelet agents. ASA Grade                        Assessment: II - A patient with mild systemic disease.                        After reviewing the risks and benefits, the patient was                        deemed in satisfactory condition to undergo the                        procedure.                       After obtaining informed consent, the colonoscope was                        passed under direct vision. Throughout the procedure,  the patient's blood pressure, pulse, and oxygen                        saturations were monitored continuously. The                        Colonoscope was introduced through the anus and                        advanced to  the the cecum, identified by appendiceal                        orifice and ileocecal valve. The colonoscopy was                        performed without difficulty. The patient tolerated the                        procedure well. The quality of the bowel preparation                        was fair. Findings:      The perianal and digital rectal examinations were normal.      Non-bleeding internal hemorrhoids were found during retroflexion. The       hemorrhoids were Grade I (internal hemorrhoids that do not prolapse). Impression:           - Preparation of the colon was fair.                       - Non-bleeding internal hemorrhoids.                       - No specimens collected. Recommendation:       - Discharge patient to home.                       - Resume previous diet.                       - Continue present medications.                       - Repeat colonoscopy in 10 years for screening unless                        any change in family history or lower GI problems. Procedure Code(s):    --- Professional ---                       [phone], Colonoscopy, flexible; diagnostic, including                        collection of specimen(s) by brushing or washing, when                        performed (separate procedure) Diagnosis Code(s):    --- Professional ---                       R63.4, Abnormal weight loss CPT copyright 2017 American Medical Association. All rights reserved. The codes documented in this report are preliminary and upon coder review  may  be revised to meet current compliance requirements. Lucilla Lame MD, MD 03/26/2018 12:24:07 PM This report has been signed electronically. Number of Addenda: 0 Note Initiated On: 03/26/2018 11:52 AM Scope Withdrawal Time: 0 hours 6 minutes 57 seconds  Total Procedure Duration: 0 hours 12 minutes 52 seconds       [facility]

## 2018-03-26 NOTE — Transfer of Care (Signed)
Immediate Anesthesia Transfer of Care Note  Patient: Susan House  Procedure(s) Performed: COLONOSCOPY WITH PROPOFOL (N/A ) ESOPHAGOGASTRODUODENOSCOPY (EGD) WITH PROPOFOL (N/A )  Patient Location: PACU  Anesthesia Type:General  Level of Consciousness: sedated  Airway & Oxygen Therapy: Patient Spontanous Breathing and Patient connected to nasal cannula oxygen  Post-op Assessment: Report given to RN and Post -op Vital signs reviewed and stable  Post vital signs: Reviewed and stable  Last Vitals:  Vitals Value Taken Time  BP 106/56 03/26/2018 12:28 PM  Temp    Pulse 55 03/26/2018 12:28 PM  Resp 11 03/26/2018 12:28 PM  SpO2 95 % 03/26/2018 12:28 PM    Last Pain:  Vitals:   03/26/18 1109  TempSrc: Tympanic  PainSc: 0-No pain         Complications: No apparent anesthesia complications

## 2018-03-26 NOTE — Anesthesia Preprocedure Evaluation (Signed)
Anesthesia Evaluation  Patient identified by MRN, date of birth, ID band Patient awake    Reviewed: Allergy & Precautions, NPO status , Patient's Chart, lab work & pertinent test results  History of Anesthesia Complications Negative for: history of anesthetic complications  Airway Mallampati: II       Dental  (+) Upper Dentures, Partial Lower   Pulmonary neg pulmonary ROS,           Cardiovascular hypertension, Pt. on medications + angina      Neuro/Psych Anxiety Depression TIACVA (left sided weakness, peripheral vision), Residual Symptoms    GI/Hepatic GERD  Medicated and Controlled,  Endo/Other  diabetes, Type 2, Oral Hypoglycemic AgentsHypothyroidism   Renal/GU Renal InsufficiencyRenal disease     Musculoskeletal   Abdominal   Peds  Hematology  (+) anemia ,   Anesthesia Other Findings   Reproductive/Obstetrics                             Anesthesia Physical  Anesthesia Plan  ASA: III  Anesthesia Plan: General   Post-op Pain Management:    Induction: Intravenous  PONV Risk Score and Plan:   Airway Management Planned: Nasal Cannula  Additional Equipment:   Intra-op Plan:   Post-operative Plan:   Informed Consent: I have reviewed the patients History and Physical, chart, labs and discussed the procedure including the risks, benefits and alternatives for the proposed anesthesia with the patient or authorized representative who has indicated his/her understanding and acceptance.     Plan Discussed with:   Anesthesia Plan Comments:         Anesthesia Quick Evaluation

## 2018-03-26 NOTE — Anesthesia Post-op Follow-up Note (Signed)
Anesthesia QCDR form completed.        

## 2018-03-26 NOTE — H&P (Signed)
Lucilla Lame, MD [address] [place], [place] [address] [phone] Fax : [phone]  Primary Care Physician:  Jerrol Banana., MD Primary Gastroenterologist:  Dr. Allen Norris  Pre-Procedure History & Physical: HPI:  Susan House is a 62 y.o. female is here for an endoscopy and colonoscopy.   Past Medical History:  Diagnosis Date  . Allergy   . Arthritis   . Cellulitis and abscess of face   . Depression   . Diabetes mellitus ([place])   . Edema   . GERD (gastroesophageal reflux disease)   . Hematuria   . History of echocardiogram    2008: normal, 2011: normal LVSF  . History of nuclear stress test    a. 12/2009: lexiscan - negative  . Hyperlipidemia   . Hypertension   . IBS (irritable bowel syndrome)   . Migraine   . Morbid obesity ([place])   . Nocturia   . Stroke ([place]) 2000  . TIA (transient ischemic attack) 2010  . Urgency of micturation   . Urinary frequency   . Urinary incontinence     Past Surgical History:  Procedure Laterality Date  . ABDOMINAL HYSTERECTOMY    . ABDOMINOPLASTY    . APPENDECTOMY    . BACK SURGERY     extensive spinal fusion  . carpel tunnel surgery    . CATARACT EXTRACTION     x 2  . CESAREAN SECTION    . CHOLECYSTECTOMY    . COLONOSCOPY  2010  . COLONOSCOPY WITH PROPOFOL N/A 07/11/2016   Procedure: COLONOSCOPY WITH PROPOFOL;  Surgeon: Lucilla Lame, MD;  Location: [facility] ENDOSCOPY;  Service: Endoscopy;  Laterality: N/A;  . EYE SURGERY    . HERNIA REPAIR    . SHOULDER SURGERY    . TOE SURGERY    . UPPER GI ENDOSCOPY    . WRIST FRACTURE SURGERY Left     Prior to Admission medications   Medication Sig Start Date End Date Taking? Authorizing Provider  amLODipine (NORVASC) 10 MG tablet Take 1 tablet (10 mg total) by mouth daily. 07/31/17  Yes Jerrol Banana., MD  atorvastatin (LIPITOR) 20 MG tablet Take 20 mg by mouth daily.   Yes [provider]  cephALEXin (KEFLEX) 500 MG capsule Take 1  capsule (500 mg total) by mouth 2 (two) times daily. 03/19/18  Yes Jerrol Banana., MD  cloNIDine (CATAPRES) 0.3 MG tablet Take 1 tablet (0.3 mg total) by mouth 2 (two) times daily. 01/01/18  Yes Jerrol Banana., MD  clopidogrel (PLAVIX) 75 MG tablet Take 1 tablet (75 mg total) by mouth daily. 10/10/17 10/10/18 Yes Dohmeier, Asencion Partridge, MD  cyclobenzaprine (FLEXERIL) 5 MG tablet Take 1 tablet (5 mg total) by mouth 3 (three) times daily as needed for muscle spasms. 02/14/18  Yes Jerrol Banana., MD  diphenoxylate-atropine (LOMOTIL) 2.5-0.025 MG tablet 2 tablets initially and then 1 tablet every 6 hours as needed for diarrhea 12/25/17  Yes Jerrol Banana., MD  enalapril (VASOTEC) 20 MG tablet Take 1 tablet (20 mg total) by mouth 2 (two) times daily. 02/04/18  Yes Jerrol Banana., MD  glucose blood (ACCU-CHEK AVIVA PLUS) test strip TEST BLOOD SUGAR THREE TIMES DAILY 12/05/17  Yes Jerrol Banana., MD  hydrALAZINE (APRESOLINE) 100 MG tablet Take 1 tablet (100 mg total) by mouth 2 (two) times daily. 01/12/18  Yes Jerrol Banana., MD  HYDROcodone-acetaminophen [facility]) 10-325 MG tablet Take 1-2 tablets  by mouth every 6 (six) hours as needed. 01/24/18  Yes Jerrol Banana., MD  insulin aspart protamine- aspart (NOVOLOG MIX 70/30) (70-30) 100 UNIT/ML injection Inject 10-35 Units into the skin 2 (two) times daily.    Yes [provider]  Lancet Devices [facility]) lancets Check sugar three times daily DX E11.9 11/23/15  Yes Jerrol Banana., MD  levETIRAcetam (KEPPRA) 500 MG tablet Take 1 tablet (500 mg total) by mouth 2 (two) times daily. 500 mg bid. 10/10/17  Yes Dohmeier, Asencion Partridge, MD  meclizine (ANTIVERT) 25 MG tablet Take 1 tablet (25 mg total) by mouth 3 (three) times daily as needed for dizziness. 01/01/18  Yes Jerrol Banana., MD  metFORMIN (GLUCOPHAGE) 1000 MG tablet Take 1 tablet (1,000 mg total) by mouth 2 (two) times daily. 07/31/17  Yes  Jerrol Banana., MD  metoprolol succinate (TOPROL-XL) 100 MG 24 hr tablet Take 1 tablet (100 mg total) by mouth daily. 07/31/17  Yes Jerrol Banana., MD  nitroGLYCERIN (NITROSTAT) 0.4 MG SL tablet Place 1 tablet (0.4 mg total) under the tongue every 5 (five) minutes as needed for chest pain. 10/11/15  Yes Jerrol Banana., MD  nystatin cream (MYCOSTATIN) Apply 1 application topically 2 (two) times daily. 02/14/18  Yes Jerrol Banana., MD  oxybutynin (DITROPAN) 5 MG tablet Take 1 tablet (5 mg total) by mouth 2 (two) times daily. 07/31/17  Yes Jerrol Banana., MD  pantoprazole (PROTONIX) 40 MG tablet Take 1 tablet (40 mg total) by mouth daily. 07/31/17  Yes Jerrol Banana., MD  ranitidine (ZANTAC) 150 MG capsule Take 1 capsule (150 mg total) by mouth every evening. 01/01/18  Yes Jerrol Banana., MD  triamcinolone cream (KENALOG) 0.1 % Can use thin layer on affected areas 2x daily for one week at a time. Not for face. 12/20/16  Yes Trinna Post, PA-C  ACCU-CHEK SOFTCLIX LANCETS lancets CHECK BLOOD SUGAR THREE TIMES DAILY 04/24/16   Jerrol Banana., MD  Blood Glucose Monitoring Suppl (ACCU-CHEK AVIVA PLUS) w/Device KIT Check sugar three times daily DX E11.9-needs a meter 11/23/15   Jerrol Banana., MD    Allergies as of 02/13/2018 - Review Complete 02/12/2018  Allergen Reaction Noted  . Morphine and related Anaphylaxis 11/27/2012  . Fentanyl Other (See Comments) 11/11/2014  . Reglan [metoclopramide]  10/02/2013  . Simvastatin Other (See Comments) 11/11/2014  . Betadine [povidone iodine] Rash 11/27/2012  . Iodine Rash 10/02/2013  . Tetracyclines & related Rash 10/02/2013    Family History  Problem Relation Age of Onset  . Hyperlipidemia Mother   . Arrhythmia Mother        WPW  . Hypertension Mother   . Rheum arthritis Mother   . Heart disease Mother   . Heart attack Father 58  . Hyperlipidemia Father   . Hypertension Father   .  Stroke Father   . Diabetes Father   . Heart disease Father   . Coronary artery disease Father   . Diabetes Sister   . Hyperlipidemia Sister   . Hypertension Sister   . Depression Sister   . Depression Sister   . Diabetes Sister   . Hypertension Sister   . Hyperlipidemia Sister   . Kidney disease Paternal Grandmother     Social History   Socioeconomic History  . Marital status: Married    Spouse name: Marcello Moores   . Number of children: 2  .  Years of education: 30  . Highest education level: Not on file  Occupational History  . Occupation: Disabled   . Occupation: retired  Scientific laboratory technician  . Financial resource strain: Somewhat hard  . Food insecurity:    Worry: Patient refused    Inability: Patient refused  . Transportation needs:    Medical: No    Non-medical: No  Tobacco Use  . Smoking status: Never Smoker  . Smokeless tobacco: Never Used  Substance and Sexual Activity  . Alcohol use: No  . Drug use: No  . Sexual activity: Never  Lifestyle  . Physical activity:    Days per week: Patient refused    Minutes per session: Patient refused  . Stress: Only a little  Relationships  . Social connections:    Talks on phone: More than three times a week    Gets together: More than three times a week    Attends religious service: More than 4 times per year    Active member of club or organization: Patient refused    Attends meetings of clubs or organizations: 1 to 4 times per year    Relationship status: Married  . Intimate partner violence:    Fear of current or ex partner: No    Emotionally abused: No    Physically abused: No    Forced sexual activity: No  Other Topics Concern  . Not on file  Social History Narrative   ** Merged History Encounter **       Patient lives at home with husband Marcello Moores.    Patient has 2 children and 2 step children.    Patient has 12+ years of education.    Patient is Disabled.     Review of Systems: See HPI, otherwise negative  ROS  Physical Exam: BP 139/63   Pulse 61   Temp (!) 96.9 F (36.1 C) (Tympanic)   Resp 18   Ht 5' 1"  (1.549 m)   Wt 104.3 kg   SpO2 100%   BMI 43.46 kg/m  General:   Alert,  pleasant and cooperative in NAD Head:  Normocephalic and atraumatic. Neck:  Supple; no masses or thyromegaly. Lungs:  Clear throughout to auscultation.    Heart:  Regular rate and rhythm. Abdomen:  Soft, nontender and nondistended. Normal bowel sounds, without guarding, and without rebound.   Neurologic:  Alert and  oriented x4;  grossly normal neurologically.  Impression/Plan: Susan House is here for an endoscopy and colonoscopy to be performed for weight loss and dysphagia  Risks, benefits, limitations, and alternatives regarding  endoscopy and colonoscopy have been reviewed with the patient.  Questions have been answered.  All parties agreeable.   Lucilla Lame, MD  03/26/2018, 11:50 AM

## 2018-03-26 NOTE — Anesthesia Procedure Notes (Signed)
Performed by: Ladeana Laplant, CRNA Pre-anesthesia Checklist: Patient identified, Emergency Drugs available, Suction available, Patient being monitored and Timeout performed Patient Re-evaluated:Patient Re-evaluated prior to induction Oxygen Delivery Method: Nasal cannula Induction Type: IV induction       

## 2018-03-26 NOTE — Op Note (Signed)
[facility] Gastroenterology Patient Name: Susan House Procedure Date: 03/26/2018 11:55 AM MRN: [id number] Account #: [id number] Date of Birth: 07-Jan-1956 Admit Type: Outpatient Age: 62 Room: [facility] ENDO ROOM 4 Gender: Female Note Status: Finalized Procedure:            Upper GI endoscopy Indications:          Dysphagia Providers:            Lucilla Lame MD, MD Referring MD:         Janine Ores. Rosanna Randy, MD (Referring MD) Medicines:            Propofol per Anesthesia Complications:        No immediate complications. Procedure:            Pre-Anesthesia Assessment:                       - Prior to the procedure, a History and Physical was                        performed, and patient medications and allergies were                        reviewed. The patient's tolerance of previous                        anesthesia was also reviewed. The risks and benefits of                        the procedure and the sedation options and risks were                        discussed with the patient. All questions were                        answered, and informed consent was obtained. Prior                        Anticoagulants: The patient has taken no previous                        anticoagulant or antiplatelet agents. ASA Grade                        Assessment: II - A patient with mild systemic disease.                        After reviewing the risks and benefits, the patient was                        deemed in satisfactory condition to undergo the                        procedure.                       After obtaining informed consent, the endoscope was                        passed under direct vision. Throughout the procedure,  the patient's blood pressure, pulse, and oxygen                        saturations were monitored continuously. The Endoscope                        was introduced through the mouth, and advanced to the       second part of duodenum. The upper GI endoscopy was                        accomplished without difficulty. The patient tolerated                        the procedure well. Findings:      The examined esophagus was normal. Two biopsies were obtained in the       middle third of the esophagus with cold forceps for histology.      Localized moderate inflammation characterized by erythema was found in       the gastric antrum. Biopsies were taken with a cold forceps for       histology.      A few 5 mm sessile polyps with no bleeding and no stigmata of recent       bleeding were found in the gastric body. Biopsies were taken with a cold       forceps for histology.      The examined duodenum was normal. Impression:           - Normal esophagus.                       - Gastritis. Biopsied.                       - A few gastric polyps. Biopsied.                       - Normal examined duodenum.                       - Two biopsies were obtained in the middle third of the                        esophagus. Recommendation:       - Discharge patient to home.                       - Resume previous diet.                       - Continue present medications.                       - Await pathology results.                       - Perform a colonoscopy today. Procedure Code(s):    --- Professional ---                       [phone], Esophagogastroduodenoscopy, flexible, transoral;                        with biopsy, single or multiple Diagnosis Code(s):    ---  Professional ---                       R13.10, Dysphagia, unspecified                       K31.7, Polyp of stomach and duodenum                       K29.70, Gastritis, unspecified, without bleeding CPT copyright 2017 American Medical Association. All rights reserved. The codes documented in this report are preliminary and upon coder review may  be revised to meet current compliance requirements. Lucilla Lame MD, MD 03/26/2018 12:07:48  PM This report has been signed electronically. Number of Addenda: 0 Note Initiated On: 03/26/2018 11:55 AM      [facility]

## 2018-03-27 ENCOUNTER — Encounter: Payer: Self-pay | Admitting: Gastroenterology

## 2018-03-27 LAB — SURGICAL PATHOLOGY

## 2018-03-27 NOTE — Anesthesia Postprocedure Evaluation (Signed)
Anesthesia Post Note  Patient: Susan House  Procedure(s) Performed: COLONOSCOPY WITH PROPOFOL (N/A ) ESOPHAGOGASTRODUODENOSCOPY (EGD) WITH PROPOFOL (N/A )  Patient location during evaluation: PACU Anesthesia Type: General Level of consciousness: awake and alert and oriented Pain management: pain level controlled Vital Signs Assessment: post-procedure vital signs reviewed and stable Respiratory status: spontaneous breathing Cardiovascular status: blood pressure returned to baseline Anesthetic complications: no     Last Vitals:  Vitals:   03/26/18 1228 03/26/18 1247  BP: (!) 106/56 (!) 163/61  Pulse: (!) 55   Resp: 11   Temp:    SpO2: 95% 98%    Last Pain:  Vitals:   03/26/18 1247  TempSrc:   PainSc: 0-No pain                 Nehemias Sauceda

## 2018-03-28 ENCOUNTER — Encounter: Payer: Self-pay | Admitting: Gastroenterology

## 2018-03-31 ENCOUNTER — Emergency Department: Payer: PPO

## 2018-03-31 ENCOUNTER — Encounter: Payer: Self-pay | Admitting: Emergency Medicine

## 2018-03-31 ENCOUNTER — Other Ambulatory Visit: Payer: Self-pay

## 2018-03-31 ENCOUNTER — Observation Stay
Admission: EM | Admit: 2018-03-31 | Discharge: 2018-04-02 | Disposition: A | Discharge status: Home / Self Care | Payer: PPO | Attending: Internal Medicine | Admitting: Internal Medicine

## 2018-03-31 DIAGNOSIS — Z885 Allergy status to narcotic agent status: Secondary | ICD-10-CM | POA: Diagnosis not present

## 2018-03-31 DIAGNOSIS — R531 Weakness: Secondary | ICD-10-CM | POA: Diagnosis not present

## 2018-03-31 DIAGNOSIS — E785 Hyperlipidemia, unspecified: Secondary | ICD-10-CM | POA: Diagnosis not present

## 2018-03-31 DIAGNOSIS — Z79899 Other long term (current) drug therapy: Secondary | ICD-10-CM | POA: Insufficient documentation

## 2018-03-31 DIAGNOSIS — G2581 Restless legs syndrome: Secondary | ICD-10-CM | POA: Insufficient documentation

## 2018-03-31 DIAGNOSIS — Z6841 Body Mass Index (BMI) 40.0 and over, adult: Secondary | ICD-10-CM | POA: Insufficient documentation

## 2018-03-31 DIAGNOSIS — I10 Essential (primary) hypertension: Secondary | ICD-10-CM | POA: Diagnosis not present

## 2018-03-31 DIAGNOSIS — Z888 Allergy status to other drugs, medicaments and biological substances status: Secondary | ICD-10-CM | POA: Insufficient documentation

## 2018-03-31 DIAGNOSIS — I639 Cerebral infarction, unspecified: Secondary | ICD-10-CM

## 2018-03-31 DIAGNOSIS — K589 Irritable bowel syndrome without diarrhea: Secondary | ICD-10-CM | POA: Diagnosis not present

## 2018-03-31 DIAGNOSIS — R4781 Slurred speech: Secondary | ICD-10-CM | POA: Diagnosis not present

## 2018-03-31 DIAGNOSIS — I69354 Hemiplegia and hemiparesis following cerebral infarction affecting left non-dominant side: Principal | ICD-10-CM | POA: Insufficient documentation

## 2018-03-31 DIAGNOSIS — Z794 Long term (current) use of insulin: Secondary | ICD-10-CM | POA: Insufficient documentation

## 2018-03-31 DIAGNOSIS — Z8614 Personal history of Methicillin resistant Staphylococcus aureus infection: Secondary | ICD-10-CM | POA: Insufficient documentation

## 2018-03-31 DIAGNOSIS — E1122 Type 2 diabetes mellitus with diabetic chronic kidney disease: Secondary | ICD-10-CM | POA: Diagnosis not present

## 2018-03-31 DIAGNOSIS — N183 Chronic kidney disease, stage 3 (moderate): Secondary | ICD-10-CM | POA: Insufficient documentation

## 2018-03-31 DIAGNOSIS — R2681 Unsteadiness on feet: Secondary | ICD-10-CM | POA: Diagnosis not present

## 2018-03-31 DIAGNOSIS — E11319 Type 2 diabetes mellitus with unspecified diabetic retinopathy without macular edema: Secondary | ICD-10-CM | POA: Diagnosis not present

## 2018-03-31 DIAGNOSIS — F329 Major depressive disorder, single episode, unspecified: Secondary | ICD-10-CM | POA: Diagnosis not present

## 2018-03-31 DIAGNOSIS — R29810 Facial weakness: Secondary | ICD-10-CM | POA: Diagnosis not present

## 2018-03-31 DIAGNOSIS — K219 Gastro-esophageal reflux disease without esophagitis: Secondary | ICD-10-CM | POA: Insufficient documentation

## 2018-03-31 DIAGNOSIS — E860 Dehydration: Secondary | ICD-10-CM | POA: Diagnosis not present

## 2018-03-31 DIAGNOSIS — G43809 Other migraine, not intractable, without status migrainosus: Secondary | ICD-10-CM | POA: Diagnosis not present

## 2018-03-31 DIAGNOSIS — E1149 Type 2 diabetes mellitus with other diabetic neurological complication: Secondary | ICD-10-CM | POA: Insufficient documentation

## 2018-03-31 DIAGNOSIS — R29818 Other symptoms and signs involving the nervous system: Secondary | ICD-10-CM | POA: Diagnosis not present

## 2018-03-31 DIAGNOSIS — I129 Hypertensive chronic kidney disease with stage 1 through stage 4 chronic kidney disease, or unspecified chronic kidney disease: Secondary | ICD-10-CM | POA: Diagnosis not present

## 2018-03-31 DIAGNOSIS — E119 Type 2 diabetes mellitus without complications: Secondary | ICD-10-CM | POA: Diagnosis not present

## 2018-03-31 LAB — URINALYSIS, COMPLETE (UACMP) WITH MICROSCOPIC
Bacteria, UA: NONE SEEN
Bilirubin Urine: NEGATIVE
GLUCOSE, UA: NEGATIVE mg/dL
Ketones, ur: NEGATIVE mg/dL
Nitrite: NEGATIVE
PH: 6 (ref 5.0–8.0)
Protein, ur: NEGATIVE mg/dL
Specific Gravity, Urine: 1.013 (ref 1.005–1.030)

## 2018-03-31 LAB — DIFFERENTIAL
BASOS ABS: 0 10*3/uL (ref 0–0.1)
Basophils Relative: 1 %
EOS ABS: 0.3 10*3/uL (ref 0–0.7)
Eosinophils Relative: 5 %
Lymphocytes Relative: 23 %
Lymphs Abs: 1.5 10*3/uL (ref 1.0–3.6)
MONOS PCT: 9 %
Monocytes Absolute: 0.6 10*3/uL (ref 0.2–0.9)
NEUTROS ABS: 4 10*3/uL (ref 1.4–6.5)
Neutrophils Relative %: 62 %

## 2018-03-31 LAB — PROTIME-INR
INR: 1.02
Prothrombin Time: 13.3 seconds (ref 11.4–15.2)

## 2018-03-31 LAB — COMPREHENSIVE METABOLIC PANEL
ALT: 19 U/L (ref 0–44)
AST: 21 U/L (ref 15–41)
Albumin: 3.7 g/dL (ref 3.5–5.0)
Alkaline Phosphatase: 73 U/L (ref 38–126)
Anion gap: 9 (ref 5–15)
BUN: 27 mg/dL — AB (ref 8–23)
CHLORIDE: 106 mmol/L (ref 98–111)
CO2: 26 mmol/L (ref 22–32)
Calcium: 8.9 mg/dL (ref 8.9–10.3)
Creatinine, Ser: 1.12 mg/dL — ABNORMAL HIGH (ref 0.44–1.00)
GFR calc Af Amer: 60 mL/min — ABNORMAL LOW (ref >60)
GFR, EST NON AFRICAN AMERICAN: 52 mL/min — AB (ref >60)
Glucose, Bld: 183 mg/dL — ABNORMAL HIGH (ref 70–99)
POTASSIUM: 3.9 mmol/L (ref 3.5–5.1)
SODIUM: 141 mmol/L (ref 135–145)
Total Bilirubin: 0.6 mg/dL (ref 0.3–1.2)
Total Protein: 6.8 g/dL (ref 6.5–8.1)

## 2018-03-31 LAB — CBC
HCT: 33.1 % — ABNORMAL LOW (ref 35.0–47.0)
Hemoglobin: 11.2 g/dL — ABNORMAL LOW (ref 12.0–16.0)
MCH: 29.7 pg (ref 26.0–34.0)
MCHC: 33.8 g/dL (ref 32.0–36.0)
MCV: 87.7 fL (ref 80.0–100.0)
Platelets: 220 10*3/uL (ref 150–440)
RBC: 3.77 MIL/uL — ABNORMAL LOW (ref 3.80–5.20)
RDW: 14 % (ref 11.5–14.5)
WBC: 6.4 10*3/uL (ref 3.6–11.0)

## 2018-03-31 LAB — TROPONIN I

## 2018-03-31 LAB — GLUCOSE, CAPILLARY
GLUCOSE-CAPILLARY: 161 mg/dL — AB (ref 70–99)
Glucose-Capillary: 89 mg/dL (ref 70–99)

## 2018-03-31 LAB — APTT: APTT: 27 s (ref 24–36)

## 2018-03-31 MED ORDER — INSULIN ASPART 100 UNIT/ML ~~LOC~~ SOLN
0.0000 [IU] | Freq: Three times a day (TID) | SUBCUTANEOUS | Status: DC
  Administered 2018-04-01 (×2): 8 [IU] via SUBCUTANEOUS
  Administered 2018-04-02 (×2): 5 [IU] via SUBCUTANEOUS
  Filled 2018-03-31 (×4): qty 1

## 2018-03-31 MED ORDER — FAMOTIDINE 20 MG PO TABS
20.0000 mg | ORAL_TABLET | Freq: Every day | ORAL | Status: DC
  Administered 2018-03-31 – 2018-04-01 (×2): 20 mg via ORAL
  Filled 2018-03-31 (×2): qty 1

## 2018-03-31 MED ORDER — SENNOSIDES-DOCUSATE SODIUM 8.6-50 MG PO TABS: 1.0000 | ORAL_TABLET | Freq: Every evening | ORAL | Status: DC | PRN

## 2018-03-31 MED ORDER — ACETAMINOPHEN 650 MG RE SUPP: 650.0000 mg | RECTAL | Status: DC | PRN

## 2018-03-31 MED ORDER — INSULIN ASPART 100 UNIT/ML ~~LOC~~ SOLN: 0.0000 [IU] | Freq: Every day | SUBCUTANEOUS | Status: DC

## 2018-03-31 MED ORDER — INSULIN GLARGINE 100 UNIT/ML ~~LOC~~ SOLN
10.0000 [IU] | Freq: Every day | SUBCUTANEOUS | Status: DC
  Administered 2018-03-31 – 2018-04-01 (×2): 10 [IU] via SUBCUTANEOUS
  Filled 2018-03-31 (×3): qty 0.1

## 2018-03-31 MED ORDER — INFLUENZA VAC SPLIT QUAD 0.5 ML IM SUSY
0.5000 mL | PREFILLED_SYRINGE | INTRAMUSCULAR | Status: AC
  Filled 2018-03-31: qty 0.5

## 2018-03-31 MED ORDER — ATORVASTATIN CALCIUM 20 MG PO TABS
20.0000 mg | ORAL_TABLET | Freq: Every day | ORAL | Status: DC
  Administered 2018-03-31 – 2018-04-01 (×2): 20 mg via ORAL
  Filled 2018-03-31 (×2): qty 1

## 2018-03-31 MED ORDER — ENOXAPARIN SODIUM 40 MG/0.4ML ~~LOC~~ SOLN
40.0000 mg | SUBCUTANEOUS | Status: DC
  Administered 2018-03-31 – 2018-04-01 (×2): 40 mg via SUBCUTANEOUS
  Filled 2018-03-31 (×2): qty 0.4

## 2018-03-31 MED ORDER — CLOPIDOGREL BISULFATE 75 MG PO TABS
75.0000 mg | ORAL_TABLET | Freq: Every day | ORAL | Status: DC
  Administered 2018-03-31 – 2018-04-02 (×3): 75 mg via ORAL
  Filled 2018-03-31 (×3): qty 1

## 2018-03-31 MED ORDER — PANTOPRAZOLE SODIUM 40 MG PO TBEC
40.0000 mg | DELAYED_RELEASE_TABLET | Freq: Every day | ORAL | Status: DC
  Administered 2018-04-01 – 2018-04-02 (×2): 40 mg via ORAL
  Filled 2018-03-31 (×2): qty 1

## 2018-03-31 MED ORDER — STROKE: EARLY STAGES OF RECOVERY BOOK
Freq: Once | Status: AC
  Administered 2018-03-31: 22:00:00

## 2018-03-31 MED ORDER — ACETAMINOPHEN 160 MG/5ML PO SOLN
650.0000 mg | ORAL | Status: DC | PRN
  Filled 2018-03-31: qty 20.3

## 2018-03-31 MED ORDER — OXYBUTYNIN CHLORIDE 5 MG PO TABS
5.0000 mg | ORAL_TABLET | Freq: Two times a day (BID) | ORAL | Status: DC
  Administered 2018-03-31 – 2018-04-02 (×4): 5 mg via ORAL
  Filled 2018-03-31 (×4): qty 1

## 2018-03-31 MED ORDER — ACETAMINOPHEN 325 MG PO TABS: 650.0000 mg | ORAL_TABLET | ORAL | Status: DC | PRN

## 2018-03-31 MED ORDER — HYDROCODONE-ACETAMINOPHEN 10-325 MG PO TABS: 1.0000 | ORAL_TABLET | Freq: Four times a day (QID) | ORAL | Status: DC | PRN

## 2018-03-31 MED ORDER — IOPAMIDOL (ISOVUE-370) INJECTION 76%
40.0000 mL | Freq: Once | INTRAVENOUS | Status: AC | PRN
  Administered 2018-03-31: 40 mL via INTRAVENOUS

## 2018-03-31 MED ORDER — LEVETIRACETAM 500 MG PO TABS
500.0000 mg | ORAL_TABLET | Freq: Two times a day (BID) | ORAL | Status: DC
  Administered 2018-03-31 – 2018-04-02 (×4): 500 mg via ORAL
  Filled 2018-03-31 (×5): qty 1

## 2018-03-31 NOTE — ED Notes (Signed)
Code stroke button hit at 1420

## 2018-03-31 NOTE — H&P (Addendum)
[facility] [place] NAME: Susan House    MR#:  [id number]  DATE OF BIRTH:  Jan 27, 1956  DATE OF ADMISSION:  03/31/2018  PRIMARY CARE PHYSICIAN: Jerrol Banana., MD   REQUESTING/REFERRING PHYSICIAN: Dr. Alfred Levins  CHIEF COMPLAINT:   Chief Complaint  Patient presents with  . Dizziness    HISTORY OF PRESENT ILLNESS:  Susan House  is a 62 y.o. female with a known history of CVA and TIA, HTN, T2DM, HLD, morbid obesity, and depression who presented to the ED with slurred speech and left arm/leg weakness that started around 11AM this morning. When she woke up this morning, she noticed that she felt "slow" and "sluggish". She went to church and continued to feel worse. Then all of the sudden, her left leg got very weak and she was unable to pick it up off the ground. She does have a history of stroke in 2000 and has residual left sided weakness, but her weakness got much worse today. She has been taking plavix daily, but stopped this for 5 days prior to an endoscopy/colonoscopy on 03/26/18. She denies any numbness, tingling, balance issues, or vision changes.  In the ED, code stroke was called. She was seen by [facility], who felt that she was outside the tPA window. CT head was negative. CT cerebral perfusion study showed possible abnormal appearance of the right anterior cerebral artery territory. Hospitalist team was called for admission.  PAST MEDICAL HISTORY:   Past Medical History:  Diagnosis Date  . Allergy   . Arthritis   . Cellulitis and abscess of face   . Depression   . Diabetes mellitus ([place])   . Edema   . GERD (gastroesophageal reflux disease)   . Hematuria   . History of echocardiogram    2008: normal, 2011: normal LVSF  . History of nuclear stress test    a. 12/2009: lexiscan - negative  . Hyperlipidemia   . Hypertension   . IBS (irritable bowel syndrome)   . Migraine   . Morbid obesity ([place])   .  Nocturia   . Stroke ([place]) 2000  . TIA (transient ischemic attack) 2010  . Urgency of micturation   . Urinary frequency   . Urinary incontinence     PAST SURGICAL HISTORY:   Past Surgical History:  Procedure Laterality Date  . ABDOMINAL HYSTERECTOMY    . ABDOMINOPLASTY    . APPENDECTOMY    . BACK SURGERY     extensive spinal fusion  . carpel tunnel surgery    . CATARACT EXTRACTION     x 2  . CESAREAN SECTION    . CHOLECYSTECTOMY    . COLONOSCOPY  2010  . COLONOSCOPY WITH PROPOFOL N/A 07/11/2016   Procedure: COLONOSCOPY WITH PROPOFOL;  Surgeon: Lucilla Lame, MD;  Location: [facility] ENDOSCOPY;  Service: Endoscopy;  Laterality: N/A;  . COLONOSCOPY WITH PROPOFOL N/A 03/26/2018   Procedure: COLONOSCOPY WITH PROPOFOL;  Surgeon: Lucilla Lame, MD;  Location[facility] ENDOSCOPY;  Service: Endoscopy;  Laterality: N/A;  . ESOPHAGOGASTRODUODENOSCOPY (EGD) WITH PROPOFOL N/A 03/26/2018   Procedure: ESOPHAGOGASTRODUODENOSCOPY (EGD) WITH PROPOFOL;  Surgeon: Lucilla Lame, MD;  Location: [facility] ENDOSCOPY;  Service: Endoscopy;  Laterality: N/A;  . EYE SURGERY    . HERNIA REPAIR    . SHOULDER SURGERY    . TOE SURGERY    . UPPER GI ENDOSCOPY    . WRIST FRACTURE SURGERY Left     SOCIAL HISTORY:  Social History   Tobacco Use  . Smoking status: Never Smoker  . Smokeless tobacco: Never Used  Substance Use Topics  . Alcohol use: No    FAMILY HISTORY:   Family History  Problem Relation Age of Onset  . Hyperlipidemia Mother   . Arrhythmia Mother        WPW  . Hypertension Mother   . Rheum arthritis Mother   . Heart disease Mother   . Heart attack Father 72  . Hyperlipidemia Father   . Hypertension Father   . Stroke Father   . Diabetes Father   . Heart disease Father   . Coronary artery disease Father   . Diabetes Sister   . Hyperlipidemia Sister   . Hypertension Sister   . Depression Sister   . Depression Sister   . Diabetes Sister   . Hypertension Sister   . Hyperlipidemia Sister   .  Kidney disease Paternal Grandmother     DRUG ALLERGIES:   Allergies  Allergen Reactions  . Morphine And Related Anaphylaxis  . Fentanyl Other (See Comments)    Unknown reaction.  . Reglan [Metoclopramide]     Other reaction(s): Unknown Elevated BP  . Simvastatin Other (See Comments)    Muscle pain  . Betadine [Povidone Iodine] Rash  . Iodine Rash    Other reaction(s): Unknown  . Tetracyclines & Related Rash    Other reaction(s): Unknown    REVIEW OF SYSTEMS:   Review of Systems  Constitutional: Negative for chills and fever.  HENT: Negative for congestion and sore throat.   Eyes: Negative for blurred vision and double vision.  Respiratory: Negative for cough and shortness of breath.   Cardiovascular: Negative for chest pain, palpitations and leg swelling.  Gastrointestinal: Negative for abdominal pain, nausea and vomiting.  Genitourinary: Negative for dysuria and hematuria.  Musculoskeletal: Negative for back pain and neck pain.  Neurological: Positive for dizziness, speech change, focal weakness and headaches. Negative for sensory change.  Psychiatric/Behavioral: Negative for depression. The patient is not nervous/anxious.      MEDICATIONS AT HOME:   Prior to Admission medications   Medication Sig Start Date End Date Taking? Authorizing Provider  ACCU-CHEK SOFTCLIX LANCETS lancets CHECK BLOOD SUGAR THREE TIMES DAILY 04/24/16  Yes Gilbert, Richard L Jr., MD  amLODipine (NORVASC) 10 MG tablet Take 1 tablet (10 mg total) by mouth daily. 07/31/17  Yes Gilbert, Richard L Jr., MD  atorvastatin (LIPITOR) 20 MG tablet Take 20 mg by mouth daily.   Yes [provider]  Blood Glucose Monitoring Suppl (ACCU-CHEK AVIVA PLUS) w/Device KIT Check sugar three times daily DX E11.9-needs a meter 11/23/15  Yes Gilbert, Richard L Jr., MD  cloNIDine (CATAPRES) 0.3 MG tablet Take 1 tablet (0.3 mg total) by mouth 2 (two) times daily. 01/01/18  Yes Gilbert, Richard L Jr., MD  clopidogrel  (PLAVIX) 75 MG tablet Take 1 tablet (75 mg total) by mouth daily. 10/10/17 10/10/18 Yes Dohmeier, Carmen, MD  diphenoxylate-atropine (LOMOTIL) 2.5-0.025 MG tablet 2 tablets initially and then 1 tablet every 6 hours as needed for diarrhea 12/25/17  Yes Gilbert, Richard L Jr., MD  enalapril (VASOTEC) 20 MG tablet Take 1 tablet (20 mg total) by mouth 2 (two) times daily. 02/04/18  Yes Gilbert, Richard L Jr., MD  glucose blood (ACCU-CHEK AVIVA PLUS) test strip TEST BLOOD SUGAR THREE TIMES DAILY 12/05/17  Yes Gilbert, Richard L Jr., MD  hydrALAZINE (APRESOLINE) 100 MG tablet Take 1 tablet (100 mg total) by mouth 2 (  two) times daily. 01/12/18  Yes Gilbert, Richard L Jr., MD  HYDROcodone-acetaminophen (NORCO) 10-325 MG tablet Take 1-2 tablets by mouth every 6 (six) hours as needed. 01/24/18  Yes Gilbert, Richard L Jr., MD  insulin aspart protamine- aspart (NOVOLOG MIX 70/30) (70-30) 100 UNIT/ML injection Inject 10-35 Units into the skin 2 (two) times daily.    Yes [provider]  Lancet Devices (ACCU-CHEK SOFTCLIX) lancets Check sugar three times daily DX E11.9 11/23/15  Yes Gilbert, Richard L Jr., MD  levETIRAcetam (KEPPRA) 500 MG tablet Take 1 tablet (500 mg total) by mouth 2 (two) times daily. 500 mg bid. 10/10/17  Yes Dohmeier, Carmen, MD  metFORMIN (GLUCOPHAGE) 1000 MG tablet Take 1 tablet (1,000 mg total) by mouth 2 (two) times daily. 07/31/17  Yes Gilbert, Richard L Jr., MD  metoprolol succinate (TOPROL-XL) 100 MG 24 hr tablet Take 1 tablet (100 mg total) by mouth daily. 07/31/17  Yes Gilbert, Richard L Jr., MD  nitroGLYCERIN (NITROSTAT) 0.4 MG SL tablet Place 1 tablet (0.4 mg total) under the tongue every 5 (five) minutes as needed for chest pain. 10/11/15  Yes Gilbert, Richard L Jr., MD  nystatin cream (MYCOSTATIN) Apply 1 application topically 2 (two) times daily. 02/14/18  Yes Gilbert, Richard L Jr., MD  oxybutynin (DITROPAN) 5 MG tablet Take 1 tablet (5 mg total) by mouth 2 (two) times daily. 07/31/17   Yes Gilbert, Richard L Jr., MD  pantoprazole (PROTONIX) 40 MG tablet Take 1 tablet (40 mg total) by mouth daily. 07/31/17  Yes Gilbert, Richard L Jr., MD  ranitidine (ZANTAC) 150 MG capsule Take 1 capsule (150 mg total) by mouth every evening. 01/01/18  Yes Gilbert, Richard L Jr., MD  triamcinolone cream (KENALOG) 0.1 % Can use thin layer on affected areas 2x daily for one week at a time. Not for face. 12/20/16  Yes Pollak, Adriana M, PA-C  cephALEXin (KEFLEX) 500 MG capsule Take 1 capsule (500 mg total) by mouth 2 (two) times daily. Patient not taking: Reported on 03/31/2018 03/19/18   Gilbert, Richard L Jr., MD  cyclobenzaprine (FLEXERIL) 5 MG tablet Take 1 tablet (5 mg total) by mouth 3 (three) times daily as needed for muscle spasms. Patient not taking: Reported on 03/31/2018 02/14/18   Gilbert, Richard L Jr., MD  meclizine (ANTIVERT) 25 MG tablet Take 1 tablet (25 mg total) by mouth 3 (three) times daily as needed for dizziness. Patient not taking: Reported on 03/31/2018 01/01/18   Gilbert, Richard L Jr., MD      VITAL SIGNS:  Blood pressure (!) 122/38, pulse (!) 51, temperature (!) 97.5 F (36.4 C), temperature source Oral, resp. rate 13, height 5' 1" (1.549 m), weight 104.3 kg, SpO2 96 %.  PHYSICAL EXAMINATION:  Physical Exam  GENERAL:  62 y.o.-year-old patient lying in the bed with no acute distress.  EYES: Pupils equal, round, reactive to light and accommodation. No scleral icterus. Extraocular muscles intact.  HEENT: Head atraumatic, normocephalic. Oropharynx and nasopharynx clear.  NECK:  Supple, no jugular venous distention. No thyroid enlargement, no tenderness.  LUNGS: Normal breath sounds bilaterally, no wheezing, rales,rhonchi or crepitation. No use of accessory muscles of respiration.  CARDIOVASCULAR: Bradycardic, regular rhythm, S1, S2 normal. No murmurs, rubs, or gallops.  ABDOMEN: Soft, nontender, nondistended. Bowel sounds present. No organomegaly or mass.  EXTREMITIES: No pedal  edema, cyanosis, or clubbing.  NEUROLOGIC: Muscle strength 2-3/5 in LUE, 2/5 in LLE, 5/5 in RUE, and 5/5 in RLE, sensation intact to light touch throughout, unable   to assess finger-to-nose testing on the left, but normal on the right. +slurred speech. PSYCHIATRIC: The patient is alert and oriented x 3.  SKIN: No obvious rash, lesion, or ulcer.   LABORATORY PANEL:   CBC Recent Labs  Lab 03/31/18 1252  WBC 6.4  HGB 11.2*  HCT 33.1*  PLT 220   ------------------------------------------------------------------------------------------------------------------  Chemistries  Recent Labs  Lab 03/31/18 1252  NA 141  K 3.9  CL 106  CO2 26  GLUCOSE 183*  BUN 27*  CREATININE 1.12*  CALCIUM 8.9  AST 21  ALT 19  ALKPHOS 73  BILITOT 0.6   ------------------------------------------------------------------------------------------------------------------  Cardiac Enzymes Recent Labs  Lab 03/31/18 1252  TROPONINI <0.03   ------------------------------------------------------------------------------------------------------------------  RADIOLOGY:  Ct Head Wo Contrast  Result Date: 03/31/2018 CLINICAL DATA:  Focal neuro deficit greater than 6 hours, stroke suspected. Weakness and LEFT-sided facial droop. EXAM: CT HEAD WITHOUT CONTRAST TECHNIQUE: Contiguous axial images were obtained from the base of the skull through the vertex without intravenous contrast. COMPARISON:  Head CT dated 10/03/2017.  Brain MRI dated 10/04/2017. FINDINGS: Brain: Ventricles are stable in size and configuration. Chronic ischemic changes again noted within the periventricular white matter regions bilaterally and LEFT basal ganglia with associated encephalomalacia. There is no mass, hemorrhage, edema or other evidence of acute parenchymal abnormality. No extra-axial hemorrhage. Vascular: Chronic calcified atherosclerotic changes of the large vessels at the skull base. No unexpected hyperdense vessel. Skull: Normal.  Negative for fracture or focal lesion. Sinuses/Orbits: No acute finding. Other: None. IMPRESSION: 1. No acute findings.  No intracranial mass, hemorrhage or edema. 2. Chronic ischemic changes within the white matter and LEFT basal ganglia. Electronically Signed   By: Franki Cabot M.D.   On: 03/31/2018 13:35   Ct Cerebral Perfusion W Contrast  Result Date: 03/31/2018 CLINICAL DATA:  Awoke today with balance disturbance and weakness. Left facial droop. EXAM: CT PERFUSION BRAIN TECHNIQUE: Multiphase CT imaging of the brain was performed following IV bolus contrast injection. Subsequent parametric perfusion maps were calculated using RAPID software. CONTRAST:  9m ISOVUE-370 IOPAMIDOL (ISOVUE-370) INJECTION 76% COMPARISON:  CT same day. Multiple previous intracranial MR angiography exams as distant as 2010. FINDINGS: CT Brain Perfusion Findings: CBF (<30%) Volume: 01mPerfusion (Tmax>6.0s) volume: 51m651mThe study does suggest a region of T-max greater than 4 seconds possibly corresponding with the distal anterior cerebral artery territory on the right. Mismatch Volume: 51mL62mPECTS on noncontrast CT Head: 10 at 1312 hours today. Infarct Core: 0 mL Infarction Location:None IMPRESSION: No finding to suggest completed infarction or T-max greater than 6 seconds. T-max greater than 4 seconds in the right anterior cerebral artery territory could represent right anterior cerebral insult. However, in reviewing an MR angiogram from March of this year, 2017 and as distant as 2010, there appears to be an abnormal appearance of the right anterior cerebral artery at that time and this could be due to chronic stenosis or occlusion of that vessel. If concern persists, consider CT angiography. Electronically Signed   By: MarkNelson Chimes.   On: 03/31/2018 15:48      IMPRESSION AND PLAN:   Left sided weakness and slurred speech- likely new CVA vs TIA. She has residual left sided weakness from her previous stroke, but weakness  got much weaker today. CT head negative. CT cerebral perfusion study with abnormality in the right anterior cerebral artery territory. Follows with Dr. DohmBrett Fairyan outpatient. - MRI brain pending - ECHO - carotid US -[facility] consulted - continue  plavix - NPO until passes bedside swallow - PT/OT/SLP consult  History of carotid stenosis- last carotid US 09/2017 with 50-69% stenosis in the right internal carotid artery and <50% stenosis in the left internal carotid artery - repeat carotid US  Hypotension with a history of hypertension- BP 83/70 in the ED - hold home norvasc, clonidine, enalapril, hydralazine, and metoprolol for permissive hypertension and until BPs improve  Type 2 diabetes- hyperglycemic in the ED. Recent A1c 10.8. - lantus 10 units qhs - moderate SSI  Hyperlipidemia- stable - continue lipitor - check lipid panel  Urinary incontinence- external urinary catheter in place - continue home oxybutynin   History of seizures- had one seizure in 2018 with postictal confusion and has been seizure-free since then - continue home keppra  Chronic normocytic anemia- Hgb at baseline. Just had endoscopy/colonoscopy 03/26/18 that were unremarkable except for hemorrhoids. - monitor  All the records are reviewed and case discussed with ED provider. Management plans discussed with the patient, family and they are in agreement.  CODE STATUS: DNR  TOTAL TIME TAKING CARE OF THIS PATIENT: 40 minutes.    Katy D Mayo M.D on 03/31/2018 at 6:13 PM  Between 7am to 6pm - Pager - [phone]  After 6pm go to [email or web address] - password [email or web address]  [facility]  Office  [phone]  CC: Primary care physician; Gilbert, Richard L Jr., MD   Note: This dictation was prepared with Dragon dictation along with smaller phrase technology. Any transcriptional errors that result from this process are unintentional.  

## 2018-03-31 NOTE — ED Notes (Signed)
Ct scan for  Perfusion with contrast

## 2018-03-31 NOTE — ED Triage Notes (Signed)
First Nurse Note:  States awoke this morning with difficulty maintaining balance.  Continued about her day, got dressed and went to church, where patient began to feel worse, more weak, and needed assistance getting up from seat.    Left facial droop noted.

## 2018-03-31 NOTE — Progress Notes (Signed)
Family Meeting Note  Advance Directive:no  Today a meeting took place with the Patient and spouse.  Patient is able to participate.  The following clinical team members were present during this meeting:MD  The following were discussed:Patient's diagnosis: , Patient's progosis: Unable to determine and Goals for treatment: DNR  Additional follow-up to be provided: prn  Time spent during discussion:20 minutes  Evette Doffing, MD
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
## 2018-03-31 NOTE — Consult Note (Addendum)
  [facility] Consult Services Impression:   The patient woke up with generalized weakness, and then 11-12 have left sided weakness, and slurred speech, she has been been dizzy since this morning. She was dizzy since the night before, and diabetes and htn, and hld. She had a questionable seizure back in april, and she is on Keppra and on plavix.  She had a CT  She was not a tpa candidate, she was off, this morning when she woke up.  Last night was the last time she felt normal.  Possible right bg, vs left cerebellar stroke. No Perfusion Defiict that is noted.  CTA head/neck is pending.      Comments:  @Door  Time: 1239 @Paged , 1443 @online , 1349 No TPA as outside of window, felt slow, off, abulic this morning.       Recommendations:  <220/110   Start antiplatelet if no obvious contraindication Stroke protocol admission/ orderset suggested with placement on stroke floor tele monitoring Bedside swallow evaluation HOB less than 30 degrees IV Fluid hydration with NS Euglycemia avoid hyperthermia, PRN acetaminophen dvt ppx Consider inpatient neurology consultation Discussed with ED MD Please call with questions ----------------------------------------------------------------------------------------- CC History of Present Illness   The patient was up and walking and taling at 7:00 pm she was normal, up walking and talking per history.   Diagnostic: CT head is negative.  Exam: Patient is in no apparent distress. Patient appears as stated age. No obvious acute respiratory or cardiac distress. Patient is well groomed and well-nourished. NIHSS score:4  1A: Level of Consciousness - Requires repeated stimulation to arouse 0 1B: Ask Month and Age - 0 Questions Right 0 1C: 'Blink Eyes' & 'Squeeze Hands' - Performs 0 Tasks 0 2: Test Horizontal Extraocular Movements - Partial Gaze Palsy: Corrects with Oculocephalic Reflex 0 3: Test Visual Fields - No Visual Loss 0 4:  Test Facial Palsy - Normal symmetry 0 5A: Test Left Arm Motor Drift - Some Effort Against Gravity 2 5B: Test Right Arm Motor Drift - Some Effort Against Gravity0 6A: Test Left Leg Motor Drift - Some Effort Against Gravity 2 6B: Test Right Leg Motor Drift - Some Effort Against Gravity 0 7: Test Limb Ataxia - Ataxia in 2 Limbs 0 8: Test Sensation - Complete Loss: Cannot Sense Being Touched At All 0 9: Test Language/Aphasia- Severe Aphasia: Fragmentary Expression, Inference Needed, Cannot Identify Materials 0 10: Test Dysarthria - Mute/Anarthric 0 11: Test Extinction/Inattention - Extinction to bilateral simultaneous stimulation 0    Medical Decision Making: - Extensive number of diagnosis or management options are considered above. - Extensive amount of complex data reviewed. - High risk of complication and/or morbidity or mortality are associated with differential diagnostic considerations above. - There may be Uncertain outcome and increased probability of prolonged functional impairment or high probability of severe prolonged functional impairment associated with some of these differential diagnosis. Medical Data Reviewed: 1.Data reviewed include clinical labs, radiology,Medical Tests; 2.Tests results discussed w/performing or interpreting physician; 3.Obtaining/reviewing old medical records; 4.Obtaining case history from another source; 5.Independent review of image, tracing or specimen

## 2018-03-31 NOTE — ED Triage Notes (Signed)
Pt states that when she woke up this morning she was off balance. Pt states that while she was in church she felt like she was getting weak, pt states that she felt weak on both sides. Pt states that she has HA on right side and is noted to have left facial droop. No slurred speech noted. Pt states that she has hx/o TIA and CVA.

## 2018-04-01 ENCOUNTER — Observation Stay: Payer: PPO

## 2018-04-01 ENCOUNTER — Encounter: Payer: Self-pay | Admitting: Radiology

## 2018-04-01 ENCOUNTER — Observation Stay (HOSPITAL_BASED_OUTPATIENT_CLINIC_OR_DEPARTMENT_OTHER)
Admit: 2018-04-01 | Discharge: 2018-04-01 | Disposition: A | Discharge status: Home / Self Care | Payer: PPO | Attending: Internal Medicine | Admitting: Internal Medicine

## 2018-04-01 DIAGNOSIS — I10 Essential (primary) hypertension: Secondary | ICD-10-CM | POA: Diagnosis not present

## 2018-04-01 DIAGNOSIS — R29818 Other symptoms and signs involving the nervous system: Secondary | ICD-10-CM | POA: Diagnosis not present

## 2018-04-01 DIAGNOSIS — R531 Weakness: Secondary | ICD-10-CM

## 2018-04-01 DIAGNOSIS — I639 Cerebral infarction, unspecified: Secondary | ICD-10-CM | POA: Diagnosis not present

## 2018-04-01 DIAGNOSIS — E785 Hyperlipidemia, unspecified: Secondary | ICD-10-CM | POA: Diagnosis not present

## 2018-04-01 DIAGNOSIS — I6789 Other cerebrovascular disease: Secondary | ICD-10-CM | POA: Diagnosis not present

## 2018-04-01 DIAGNOSIS — G459 Transient cerebral ischemic attack, unspecified: Secondary | ICD-10-CM | POA: Diagnosis not present

## 2018-04-01 DIAGNOSIS — I5030 Unspecified diastolic (congestive) heart failure: Secondary | ICD-10-CM

## 2018-04-01 DIAGNOSIS — E119 Type 2 diabetes mellitus without complications: Secondary | ICD-10-CM | POA: Diagnosis not present

## 2018-04-01 DIAGNOSIS — I6523 Occlusion and stenosis of bilateral carotid arteries: Secondary | ICD-10-CM | POA: Diagnosis not present

## 2018-04-01 LAB — LIPID PANEL
Cholesterol: 102 mg/dL (ref 0–200)
HDL: 29 mg/dL — AB (ref >40)
LDL Cholesterol: 33 mg/dL (ref 0–99)
Total CHOL/HDL Ratio: 3.5 RATIO
Triglycerides: 198 mg/dL — ABNORMAL HIGH (ref <150)
VLDL: 40 mg/dL (ref 0–40)

## 2018-04-01 LAB — GLUCOSE, CAPILLARY
GLUCOSE-CAPILLARY: 176 mg/dL — AB (ref 70–99)
Glucose-Capillary: 221 mg/dL — ABNORMAL HIGH (ref 70–99)
Glucose-Capillary: 267 mg/dL — ABNORMAL HIGH (ref 70–99)
Glucose-Capillary: 188 mg/dL — ABNORMAL HIGH (ref 70–99)

## 2018-04-01 LAB — ECHOCARDIOGRAM COMPLETE
Height: 61 in
Weight: 3680 oz

## 2018-04-01 MED ORDER — IOHEXOL 350 MG/ML SOLN
75.0000 mL | Freq: Once | INTRAVENOUS | Status: AC | PRN
  Administered 2018-04-01: 13:00:00 75 mL via INTRAVENOUS

## 2018-04-01 MED ORDER — TRAMADOL HCL 50 MG PO TABS: 50.0000 mg | ORAL_TABLET | Freq: Four times a day (QID) | ORAL | Status: DC | PRN

## 2018-04-01 MED ORDER — HYDROCODONE-ACETAMINOPHEN 10-325 MG PO TABS: 1.0000 | ORAL_TABLET | Freq: Four times a day (QID) | ORAL | Status: DC | PRN

## 2018-04-01 NOTE — Progress Notes (Signed)
SLP Cancellation Note  Patient Details Name: Susan House MRN: [id number] DOB: December 24, 1955   Cancelled treatment:       Reason Eval/Treat Not Completed: Patient at procedure or test/unavailable Reviewed chart. Attempted Bedside Swallow Evaluation; however patient unavailable, off floor for ultrasound. Will reattempt at a later time/date.   Meshell Abdulaziz, CCC-SLP 04/01/2018, 8:40 AM

## 2018-04-01 NOTE — Progress Notes (Signed)
[facility] [place] NAME: Susan House    MR#:  [id number]  DATE OF BIRTH:  [birth year]  SUBJECTIVE:   Patient here with left-sided weakness.  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  She is unable to move her left leg.  She denies fall or trauma  REVIEW OF SYSTEMS:    Review of Systems  Constitutional: Negative for fever, chills weight loss HENT: Negative for ear pain, nosebleeds, congestion, facial swelling, rhinorrhea, neck pain, neck stiffness and ear discharge.   Respiratory: Negative for cough, shortness of breath, wheezing  Cardiovascular: Negative for chest pain, palpitations and leg swelling.  Gastrointestinal: Negative for heartburn, abdominal pain, vomiting, diarrhea or consitpation Genitourinary: Negative for dysuria, urgency, frequency, hematuria Musculoskeletal: Negative for back pain or joint pain Neurological: She has left hemiplegia worsening left lower extremity weakness  negative for dizziness, seizures, syncope,numbness and headaches.  Hematological: Does not bruise/bleed easily.  Psychiatric/Behavioral: Negative for hallucinations, confusion, dysphoric mood    Tolerating Diet: yes      DRUG ALLERGIES:   Allergies  Allergen Reactions  . Morphine And Related Anaphylaxis  . Fentanyl Other (See Comments)    Unknown reaction.  . Reglan [Metoclopramide]     Other reaction(s): Unknown Elevated BP  . Simvastatin Other (See Comments)    Muscle pain  . Betadine [Povidone Iodine] Rash  . Iodine Rash    Other reaction(s): Unknown  . Tetracyclines & Related Rash    Other reaction(s): Unknown    VITALS:  Blood pressure (!) 140/45, pulse 62, temperature 98.5 F (36.9 C), resp. rate 16, height 5\' 1"  (1.549 m), weight 104.3 kg, SpO2 96 %.  PHYSICAL EXAMINATION:  Constitutional: Appears well-developed and well-nourished. No  distress. HENT: Normocephalic. Marland Kitchen Oropharynx is clear and moist.  Eyes: Conjunctivae and EOM are normal. PERRLA, no scleral icterus.  Neck: Normal ROM. Neck supple. No JVD. No tracheal deviation. CVS: RRR, S1/S2 +, no murmurs, no gallops, no carotid bruit.  Pulmonary: Effort and breath sounds normal, no stridor, rhonchi, wheezes, rales.  Abdominal: Soft. BS +,  no distension, tenderness, rebound or guarding.  Musculoskeletal: Left hemiplegia no edema and no tenderness.  Neuro: Alert. CN 2-12 grossly intact. No focal deficits. Skin: Skin is warm and dry. No rash noted. Psychiatric: Normal mood and affect.      LABORATORY PANEL:   CBC Recent Labs  Lab 03/31/18 1252  WBC 6.4  HGB 11.2*  HCT 33.1*  PLT 220   ------------------------------------------------------------------------------------------------------------------  Chemistries  Recent Labs  Lab 03/31/18 1252  NA 141  K 3.9  CL 106  CO2 26  GLUCOSE 183*  BUN 27*  CREATININE 1.12*  CALCIUM 8.9  AST 21  ALT 19  ALKPHOS 73  BILITOT 0.6   ------------------------------------------------------------------------------------------------------------------  Cardiac Enzymes Recent Labs  Lab 03/31/18 1252  TROPONINI <0.03   ------------------------------------------------------------------------------------------------------------------  RADIOLOGY:  Ct Head Wo Contrast  Result Date: 03/31/2018 CLINICAL DATA:  Focal neuro deficit greater than 6 hours, stroke suspected. Weakness and LEFT-sided facial droop. EXAM: CT HEAD WITHOUT CONTRAST TECHNIQUE: Contiguous axial images were obtained from the base of the skull through the vertex without intravenous contrast. COMPARISON:  Head CT dated 10/03/2017.  Brain MRI dated 10/04/2017. FINDINGS: Brain: Ventricles are stable in size and configuration. Chronic ischemic changes again noted within the periventricular white matter regions bilaterally and LEFT basal ganglia with  associated encephalomalacia.  There is no mass, hemorrhage, edema or other evidence of acute parenchymal abnormality. No extra-axial hemorrhage. Vascular: Chronic calcified atherosclerotic changes of the large vessels at the skull base. No unexpected hyperdense vessel. Skull: Normal. Negative for fracture or focal lesion. Sinuses/Orbits: No acute finding. Other: None. IMPRESSION: 1. No acute findings.  No intracranial mass, hemorrhage or edema. 2. Chronic ischemic changes within the white matter and LEFT basal ganglia. Electronically Signed   By: Franki Cabot M.D.   On: 03/31/2018 13:35   US Carotid Bilateral (at [facility] Only)  Result Date: 04/01/2018 CLINICAL DATA:  Left-sided weakness and slurred speech. History of hypertension, hyperlipidemia and diabetes. EXAM: BILATERAL CAROTID DUPLEX ULTRASOUND TECHNIQUE: Pearline Cables scale imaging, color Doppler and duplex ultrasound were performed of bilateral carotid and vertebral arteries in the neck. COMPARISON:  10/04/2017 FINDINGS: Criteria: Quantification of carotid stenosis is based on velocity parameters that correlate the residual internal carotid diameter with NASCET-based stenosis levels, using the diameter of the distal internal carotid lumen as the denominator for stenosis measurement. The following velocity measurements were obtained: RIGHT ICA:  196/37 cm/sec CCA:  74/08 cm/sec SYSTOLIC ICA/CCA RATIO:  3.1 ECA:  197 cm/sec LEFT ICA:  75/17 cm/sec CCA:  14/48 cm/sec SYSTOLIC ICA/CCA RATIO:  1.3 ECA:  153 cm/sec RIGHT CAROTID ARTERY: Similar and stable appearance of moderate amount of calcified plaque at the level of the distal common carotid artery, carotid bulb and extending into the proximal internal and external carotid arteries. Turbulent flow present in the proximal right ICA with stable velocities corresponding to an estimated 50-69% stenosis. Velocities and systolic ratio almost meet criteria for a greater than 70% stenosis. RIGHT VERTEBRAL ARTERY:  Antegrade flow with normal waveform and velocity. LEFT CAROTID ARTERY: Stable moderate calcified plaque at the level of the carotid bulb and proximal left ICA. Velocities are stable and estimated left ICA stenosis remains less than 50%. LEFT VERTEBRAL ARTERY: Antegrade flow with normal waveform and velocity. IMPRESSION: 1. Stable appearance of moderate plaque at the right carotid bifurcation with estimated 50-69% proximal right ICA stenosis. Velocity and velocity ratio almost meet criteria for a greater than 70% stenosis. 2. Stable appearance of plaque at the left carotid bifurcation with estimated less than 50% proximal left ICA stenosis. Electronically Signed   By: Aletta Edouard M.D.   On: 04/01/2018 09:33   Ct Cerebral Perfusion W Contrast  Result Date: 03/31/2018 CLINICAL DATA:  Awoke today with balance disturbance and weakness. Left facial droop. EXAM: CT PERFUSION BRAIN TECHNIQUE: Multiphase CT imaging of the brain was performed following IV bolus contrast injection. Subsequent parametric perfusion maps were calculated using RAPID software. CONTRAST:  45mL ISOVUE-370 IOPAMIDOL (ISOVUE-370) INJECTION 76% COMPARISON:  CT same day. Multiple previous intracranial MR angiography exams as distant as 2010. FINDINGS: CT Brain Perfusion Findings: CBF (<30%) Volume: 32mL Perfusion (Tmax>6.0s) volume: 24mL. The study does suggest a region of T-max greater than 4 seconds possibly corresponding with the distal anterior cerebral artery territory on the right. Mismatch Volume: 24mL ASPECTS on noncontrast CT Head: 10 at 1312 hours today. Infarct Core: 0 mL Infarction Location:None IMPRESSION: No finding to suggest completed infarction or T-max greater than 6 seconds. T-max greater than 4 seconds in the right anterior cerebral artery territory could represent right anterior cerebral insult. However, in reviewing an MR angiogram from March of this year, 2017 and as distant as 2010, there appears to be an abnormal appearance  of the right anterior cerebral artery at that time and this could be due  to chronic stenosis or occlusion of that vessel. If concern persists, consider CT angiography. Electronically Signed   By: Nelson Chimes M.D.   On: 03/31/2018 15:48     ASSESSMENT AND PLAN:   62 year old female with a history of CVA and left-sided residual deficit, diabetes and hyperlipidemia presented to the ER with left arm/leg weakness and slurred speech.  1.  Acute CVA: Patient continues to have left-sided deficit worse than previous deficits. Follow-up on MRI, echo and carotid Doppler Follow-up in neurology, PT, OT and speech consultation Continue Plavix and atorvastatin LDL 33 which is at goal  2.  Diabetes: Continue sliding scale, test and ADA diet  3.  Hypertension: Initially blood pressure medications were on hold due to low blood pressure.  I will continue to hold blood pressure medications to allow brain perfusion due to acute CVA.  4.  History of seizures: Continue Keppra  5.  Hyperlipidemia: Continue statin      Management plans discussed with the patient and she is in agreement.  CODE STATUS: DNR  TOTAL TIME TAKING CARE OF THIS PATIENT: 30 minutes.     POSSIBLE D/C tomorrow, DEPENDING ON CLINICAL CONDITION.   Kaymen Adrian M.D on 04/01/2018 at 10:23 AM  Between 7am to 6pm - Pager - [phone] After 6pm go to [email or web address] - password [email or web address]  Office  [phone]  CC: Primary care physician; Jerrol Banana., MD  Note: This dictation was prepared with Dragon dictation along with smaller phrase technology. Any transcriptional errors that result from this process are unintentional.

## 2018-04-01 NOTE — Clinical Social Work Note (Signed)
Clinical Social Work Assessment  Patient Details  Name: Susan House MRN: [id number] Date of Birth: 1956/03/13  Date of referral:  04/01/18               Reason for consult:  Facility Placement                Permission sought to share information with:  Case Manager, Customer service manager, Family Supports Permission granted to share information::  Yes, Verbal Permission Granted  Name::        Agency::     Relationship::     Contact Information:     Housing/Transportation Living arrangements for the past 2 months:  Single Family Home Source of Information:  Patient Patient Interpreter Needed:  None Criminal Activity/Legal Involvement Pertinent to Current Situation/Hospitalization:  No - Comment as needed Significant Relationships:  Adult Children, Spouse Lives with:  Spouse Do you feel safe going back to the place where you live?  Yes Need for family participation in patient care:  Yes (Comment)  Care giving concerns:  Patient lives with her husband    Facilities manager / plan:  CSW consulted for SNF placement. CSW met with patient, husband Susan House and daughter Susan House at bedside. Patient states that she lives with her husband and feels that she is doing pretty well. CSW explained that PT is recommending SNF. Patient states that she does not want to go to SNF and would prefer to go home with husband. Husband and daughter are also in agreement with this. Patient states that she has had [facility] in the past and would like to have them again. CSW made RN CM aware of above. CSW signing off. Please re consult if further needs arise.   Employment status:  Retired Nurse, adult PT Recommendations:  [place] / Referral to community resources:  [place]  Patient/Family's Response to care:  Patient and family thanked CSW for assistance   Patient/Family's Understanding of  and Emotional Response to Diagnosis, Current Treatment, and Prognosis:  Patient and family state understanding of current treatment and plan   Emotional Assessment Appearance:  Appears stated age Attitude/Demeanor/Rapport:    Affect (typically observed):  Accepting, Pleasant Orientation:  Oriented to Self, Oriented to Place, Oriented to  Time, Oriented to Situation Alcohol / Substance use:  Not Applicable Psych involvement (Current and /or in the community):  No (Comment)  Discharge Needs  Concerns to be addressed:  Discharge Planning Concerns Readmission within the last 30 days:  No Current discharge risk:  None Barriers to Discharge:  Continued Medical Work up   [employer], [place] 04/01/2018, 2:46 PM

## 2018-04-01 NOTE — Evaluation (Signed)
Physical Therapy Evaluation Patient Details Name: Susan House MRN: [id number] DOB: 09-01-1955 Today's Date: 04/01/2018   History of Present Illness  presented to ER secondary to L UE/LE weakness; admitted for TIA/CVA work up.  Head CT negative; MRI pending (returned negative at time of note)  Clinical Impression  Upon evaluation, patient alert and oriented; follows commands and demonstrates good effort with all mobility tasks.  Speech generally effortful, mildly dysarthric, but able to make needs known and maintain fluent conversation.  Noted with moderate L UE > LE hemiparesis (worse than baseline per patient and husband), distally > proximally.  MAintains L hand in fisted position; passive assist from therapist required to open.  Currently requiring min/mod assist for bed mobility; min/mod assist for sit/stand, basic transfers and gait (30').  L LE control improves with distance, but continues to demonstrate limited heel strike/toe off and subsequent L LE drag throughout gait cycle; hand-over-hand required for L UE position/placement. Increased fall risk evident; unsafe to attempt gait/mobility without assist device and +1 assist at all times. Would benefit from skilled PT to address above deficits and promote optimal return to PLOF; recommend transition to STR upon discharge from acute hospitalization.   Will continue to monitor patient status throughout hospital stay and update recommendations as appropriate.     Follow Up Recommendations SNF    Equipment Recommendations       Recommendations for Other Services       Precautions / Restrictions Precautions Precautions: Fall Restrictions Weight Bearing Restrictions: No      Mobility  Bed Mobility Overal bed mobility: Needs Assistance Bed Mobility: Supine to Sit     Supine to sit: Min assist;Mod assist     General bed mobility comments: assist for truncal elevation  Transfers Overall transfer level: Needs  assistance Equipment used: Rolling walker (2 wheeled) Transfers: Sit to/from Stand Sit to Stand: Min assist;Mod assist         General transfer comment: mod progressing to min assist for lift off, standing balance, L knee control and L UE placement on RW (hand-over-hand to unfist and place)  Ambulation/Gait Ambulation/Gait assistance: Min assist;+2 safety/equipment Gait Distance (Feet): 30 Feet Assistive device: Rolling walker (2 wheeled)       General Gait Details: step to progressing to step through gait pattern; limited heel strike/toe off of L LE, resulting in L LE drag across the floor when advancing.  Improving knee control with distance; continues to require assist for L hand placement/sustained grasp  Stairs            Wheelchair Mobility    Modified Rankin (Stroke Patients Only)       Balance Overall balance assessment: Needs assistance Sitting-balance support: No upper extremity supported;Feet supported Sitting balance-Leahy Scale: Good     Standing balance support: Bilateral upper extremity supported Standing balance-Leahy Scale: Fair                               Pertinent Vitals/Pain Pain Assessment: No/denies pain    Home Living Family/patient expects to be discharged to:: Private residence Living Arrangements: Spouse/significant other Available Help at Discharge: Family Type of Home: House Home Access: Ramped entrance     Home Layout: One level Home Equipment: Transport chair;Cane - single point;Walker - 2 wheels;Bedside commode      Prior Function Level of Independence: Independent with assistive device(s)         Comments: Ambulatory for household  distances w/ SPC, sup/mod indep; intermittent use of RW "when needed".  Able to complete ADLs indep, though husband available to assist as needed.  Denies fall history in previous six months.     Hand Dominance   Dominant Hand: Right    Extremity/Trunk Assessment   Upper  Extremity Assessment Upper Extremity Assessment: (L UE shoulder and elbow 3-/5, wrist and hand 0/5; maintains in fisted position (assist from therapist to extend to neutral).  Denies sensory deficit.)    Lower Extremity Assessment Lower Extremity Assessment: (L LE hip and knee grossly 2+ to 3-/5, generally discoodinated, ankle and toes 2-/5; denies sensory deficit.)       Communication   Communication: (mild slowing of rate of speech; somewhat effortful )  Cognition Arousal/Alertness: Awake/alert Behavior During Therapy: WFL for tasks assessed/performed Overall Cognitive Status: Within Functional Limits for tasks assessed                                        General Comments      Exercises Other Exercises Other Exercises: Sit/stand x3 with RW, mod progressing to min assist-emphasis on lift off, midline and symmetrical WBing/use of bilat LEs Other Exercises: Toilet transfer, SPT with RW, min assist; sit/stand from [facility] with RW, min assist.  CNA at bedside to complete toileting/hygiene with patient as needed.   Assessment/Plan    PT Assessment Patient needs continued PT services  PT Problem List Decreased strength;Decreased balance;Decreased mobility;Decreased coordination;Decreased cognition;Decreased activity tolerance;Decreased knowledge of use of DME;Decreased safety awareness;Decreased knowledge of precautions;Obesity       PT Treatment Interventions DME instruction;Gait training;Functional mobility training;Therapeutic activities;Therapeutic exercise;Balance training;Neuromuscular re-education;Patient/family education    PT Goals (Current goals can be found in the Care Plan section)  Acute Rehab PT Goals Patient Stated Goal: to get my left leg working again PT Goal Formulation: With patient/family Time For Goal Achievement: 04/15/18 Potential to Achieve Goals: Good    Frequency Min 2X/week   Barriers to discharge        Co-evaluation                AM-PAC PT "6 Clicks" Daily Activity  Outcome Measure Difficulty turning over in bed (including adjusting bedclothes, sheets and blankets)?: Unable Difficulty moving from lying on back to sitting on the side of the bed? : Unable Difficulty sitting down on and standing up from a chair with arms (e.g., wheelchair, bedside commode, etc,.)?: Unable Help needed moving to and from a bed to chair (including a wheelchair)?: A Little Help needed walking in hospital room?: A Lot Help needed climbing 3-5 steps with a railing? : A Lot 6 Click Score: 10    End of Session Equipment Utilized During Treatment: Gait belt Activity Tolerance: Patient tolerated treatment well Patient left: (on BSC with CNA to complete toileting needs) Nurse Communication: Mobility status PT Visit Diagnosis: Unsteadiness on feet (R26.81);Muscle weakness (generalized) (M62.81);Difficulty in walking, not elsewhere classified (R26.2);Hemiplegia and hemiparesis Hemiplegia - Right/Left: Left Hemiplegia - dominant/non-dominant: Non-dominant Hemiplegia - caused by: Unspecified    Time: 5188-4166 PT Time Calculation (min) (ACUTE ONLY): 34 min   Charges:   PT Evaluation $PT Eval Moderate Complexity: 1 Mod PT Treatments $Therapeutic Activity: 8-22 mins $Neuromuscular Re-education: 8-22 mins      Jessey Stehlin H. Owens Shark, PT, DPT, NCS 04/01/18, 2:27 PM [phone]

## 2018-04-01 NOTE — Progress Notes (Signed)
PT Cancellation Note  Patient Details Name: [place] Huberty MRN: [id number] DOB: 1956-06-12   Cancelled Treatment:    Reason Eval/Treat Not Completed: Patient at procedure or test/unavailable(Consult received and chart reviewed. Patient currently off unit for diagnostic testing.  Will re-attempt at later time/date as medically appropriate and available.)   Evon Lopezperez H. Owens Shark, PT, DPT, NCS 04/01/18, 8:53 AM [phone]

## 2018-04-01 NOTE — Consult Note (Signed)
Referring Physician: Sela Hua, MD    Chief Complaint: Left side weakness and slurred speech  HPI: Susan House is an 62 y.o. 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  Patient states that she woke up in the morning with generalized weakness and not feeling well.  Patient states that she went to church however she continued to feel unwell with worsening of her left side weakness.  Patient states that while at church she suddenly felt heaviness in her left leg and  was unable to pick it up off the ground.  She denies other associated symptoms of sudden headache, visual disturbance, or other focal neurologic deficit other than what she described.  She had a stroke work-up in the ED including CT head which was normal without acute intracranial abnormality.  She also had a CT perfusion study which showed possible abnormal appearance of the right anterior cerebral artery territory.  Due to her significant risk factors for stroke as above, patient was admitted for further stroke work-up.  Initial NIHSS score:4  Date last known well: Date: 03/31/2018 Time last known well: Time: 11:00 tPA Given: No: outside time window   Past Medical History:  Diagnosis Date  . Allergy   . Arthritis   . Cellulitis and abscess of face   . Depression   . Diabetes mellitus ([place])   . Edema   . GERD (gastroesophageal reflux disease)   . Hematuria   . History of echocardiogram    2008: normal, 2011: normal LVSF  . History of nuclear stress test    a. 12/2009: lexiscan - negative  . Hyperlipidemia   . Hypertension   . IBS (irritable bowel syndrome)   . Migraine   . Morbid obesity ([place])   . Nocturia   . Stroke ([place]) 2000  . TIA (transient ischemic  attack) 2010  . Urgency of micturation   . Urinary frequency   . Urinary incontinence     Past Surgical History:  Procedure Laterality Date  . ABDOMINAL HYSTERECTOMY    . ABDOMINOPLASTY    . APPENDECTOMY    . BACK SURGERY     extensive spinal fusion  . carpel tunnel surgery    . CATARACT EXTRACTION     x 2  . CESAREAN SECTION    . CHOLECYSTECTOMY    . COLONOSCOPY  2010  . COLONOSCOPY WITH PROPOFOL N/A 07/11/2016   Procedure: COLONOSCOPY WITH PROPOFOL;  Surgeon: Lucilla Lame, MD;  Location: [facility] ENDOSCOPY;  Service: Endoscopy;  Laterality: N/A;  . COLONOSCOPY WITH PROPOFOL N/A 03/26/2018   Procedure: COLONOSCOPY WITH PROPOFOL;  Surgeon: Lucilla Lame, MD;  Location: [facility] ENDOSCOPY;  Service: Endoscopy;  Laterality: N/A;  . ESOPHAGOGASTRODUODENOSCOPY (EGD) WITH PROPOFOL N/A 03/26/2018   Procedure: ESOPHAGOGASTRODUODENOSCOPY (EGD) WITH PROPOFOL;  Surgeon: Lucilla Lame, MD;  Location: [facility] ENDOSCOPY;  Service: Endoscopy;  Laterality: N/A;  . EYE SURGERY    . HERNIA REPAIR    . SHOULDER SURGERY    . TOE SURGERY    . UPPER GI ENDOSCOPY    . WRIST FRACTURE SURGERY Left     Family History  Problem Relation Age of Onset  . Hyperlipidemia Mother   . Arrhythmia Mother        WPW  . Hypertension Mother   . Rheum arthritis Mother   .  Heart disease Mother   . Heart attack Father 85  . Hyperlipidemia Father   . Hypertension Father   . Stroke Father   . Diabetes Father   . Heart disease Father   . Coronary artery disease Father   . Diabetes Sister   . Hyperlipidemia Sister   . Hypertension Sister   . Depression Sister   . Depression Sister   . Diabetes Sister   . Hypertension Sister   . Hyperlipidemia Sister   . Kidney disease Paternal Grandmother    Social History:  reports that she has never smoked. She has never used smokeless tobacco. She reports that she does not drink alcohol or use drugs.  Allergies:  Allergies  Allergen Reactions  . Morphine And Related Anaphylaxis   . Fentanyl Other (See Comments)    Unknown reaction.  . Reglan [Metoclopramide]     Other reaction(s): Unknown Elevated BP  . Simvastatin Other (See Comments)    Muscle pain  . Betadine [Povidone Iodine] Rash  . Iodine Rash    Other reaction(s): Unknown  . Tetracyclines & Related Rash    Other reaction(s): Unknown    Medications:  I have reviewed the patient's current medications. Prior to Admission:  Medications Prior to Admission  Medication Sig Dispense Refill Last Dose  . ACCU-CHEK SOFTCLIX LANCETS lancets CHECK BLOOD SUGAR THREE TIMES DAILY 300 each 3 Taking  . amLODipine (NORVASC) 10 MG tablet Take 1 tablet (10 mg total) by mouth daily. 90 tablet 3 03/31/2018 at 0900  . atorvastatin (LIPITOR) 20 MG tablet Take 20 mg by mouth daily.   03/30/2018 at 1700  . Blood Glucose Monitoring Suppl (ACCU-CHEK AVIVA PLUS) w/Device KIT Check sugar three times daily DX E11.9-needs a meter 1 kit 0 Taking  . cloNIDine (CATAPRES) 0.3 MG tablet Take 1 tablet (0.3 mg total) by mouth 2 (two) times daily. 60 tablet 12 03/31/2018 at 0900  . clopidogrel (PLAVIX) 75 MG tablet Take 1 tablet (75 mg total) by mouth daily. 90 tablet 3 03/30/2018 at 1700  . diphenoxylate-atropine (LOMOTIL) 2.5-0.025 MG tablet 2 tablets initially and then 1 tablet every 6 hours as needed for diarrhea 60 tablet 1 PRN at PRN  . enalapril (VASOTEC) 20 MG tablet Take 1 tablet (20 mg total) by mouth 2 (two) times daily. 180 tablet 2 03/31/2018 at 0900  . glucose blood (ACCU-CHEK AVIVA PLUS) test strip TEST BLOOD SUGAR THREE TIMES DAILY 300 each 3 Past Month at Unknown time  . hydrALAZINE (APRESOLINE) 100 MG tablet Take 1 tablet (100 mg total) by mouth 2 (two) times daily. 60 tablet 11 03/31/2018 at 0900  . HYDROcodone-acetaminophen (NORCO) 10-325 MG tablet Take 1-2 tablets by mouth every 6 (six) hours as needed. 100 tablet 0 PRN at PRN  . insulin aspart protamine- aspart (NOVOLOG MIX 70/30) (70-30) 100 UNIT/ML injection Inject 10-35  Units into the skin 2 (two) times daily.    03/31/2018 at 0900  . Lancet Devices (ACCU-CHEK SOFTCLIX) lancets Check sugar three times daily DX E11.9 100 each 3 Past Week at Unknown time  . levETIRAcetam (KEPPRA) 500 MG tablet Take 1 tablet (500 mg total) by mouth 2 (two) times daily. 500 mg bid. 180 tablet 3 03/31/2018 at 0900  . metFORMIN (GLUCOPHAGE) 1000 MG tablet Take 1 tablet (1,000 mg total) by mouth 2 (two) times daily. 180 tablet 3 03/31/2018 at 0900  . metoprolol succinate (TOPROL-XL) 100 MG 24 hr tablet Take 1 tablet (100 mg total) by mouth daily. 90 tablet 3  Past Week at Unknown time  . nitroGLYCERIN (NITROSTAT) 0.4 MG SL tablet Place 1 tablet (0.4 mg total) under the tongue every 5 (five) minutes as needed for chest pain. 25 tablet 0 PRN at PRN  . nystatin cream (MYCOSTATIN) Apply 1 application topically 2 (two) times daily. 30 g 0 PRN at PRN  . oxybutynin (DITROPAN) 5 MG tablet Take 1 tablet (5 mg total) by mouth 2 (two) times daily. 180 tablet 3 03/31/2018 at 0900  . pantoprazole (PROTONIX) 40 MG tablet Take 1 tablet (40 mg total) by mouth daily. 90 tablet 3 03/31/2018 at 0900  . ranitidine (ZANTAC) 150 MG capsule Take 1 capsule (150 mg total) by mouth every evening. 30 capsule 12 03/30/2018 at 1700  . triamcinolone cream (KENALOG) 0.1 % Can use thin layer on affected areas 2x daily for one week at a time. Not for face. 30 g 0 PRN at PRN  . cephALEXin (KEFLEX) 500 MG capsule Take 1 capsule (500 mg total) by mouth 2 (two) times daily. (Patient not taking: Reported on 03/31/2018) 10 capsule 0 Completed Course at Unknown time  . cyclobenzaprine (FLEXERIL) 5 MG tablet Take 1 tablet (5 mg total) by mouth 3 (three) times daily as needed for muscle spasms. (Patient not taking: Reported on 03/31/2018) 30 tablet 1 Not Taking at Unknown time  . meclizine (ANTIVERT) 25 MG tablet Take 1 tablet (25 mg total) by mouth 3 (three) times daily as needed for dizziness. (Patient not taking: Reported on 03/31/2018) 30  tablet 0 Not Taking at Unknown time   Scheduled: . atorvastatin  20 mg Oral Daily  . clopidogrel  75 mg Oral Daily  . enoxaparin (LOVENOX) injection  40 mg Subcutaneous Q24H  . famotidine  20 mg Oral Daily  . Influenza vac split quadrivalent PF  0.5 mL Intramuscular Tomorrow-1000  . insulin aspart  0-15 Units Subcutaneous TID WC  . insulin aspart  0-5 Units Subcutaneous QHS  . insulin glargine  10 Units Subcutaneous QHS  . levETIRAcetam  500 mg Oral BID  . oxybutynin  5 mg Oral BID  . pantoprazole  40 mg Oral Daily    ROS: History obtained from the patient   General ROS: negative for - chills, fatigue, fever, night sweats, weight gain or weight loss Psychological ROS: negative for - behavioral disorder, hallucinations, memory difficulties, mood swings or suicidal ideation Ophthalmic ROS: negative for - blurry vision, double vision, eye pain or loss of vision ENT ROS: negative for - epistaxis, nasal discharge, oral lesions, sore throat, tinnitus or vertigo Allergy and Immunology ROS: negative for - hives or itchy/watery eyes Hematological and Lymphatic ROS: negative for - bleeding problems, bruising or swollen lymph nodes Endocrine ROS: negative for - galactorrhea, hair pattern changes, polydipsia/polyuria or temperature intolerance Respiratory ROS: negative for - cough, hemoptysis, shortness of breath or wheezing Cardiovascular ROS: negative for - chest pain, dyspnea on exertion, edema or irregular heartbeat Gastrointestinal ROS: negative for - abdominal pain, diarrhea, hematemesis, nausea/vomiting or stool incontinence Genito-Urinary ROS: negative for - dysuria, hematuria, incontinence or urinary frequency/urgency Musculoskeletal ROS: negative for - joint swelling or muscular weakness Neurological ROS: as noted in HPI Dermatological ROS: negative for rash and skin lesion changes  Physical Exam   Vitals Blood pressure (!) 140/45, pulse 62, temperature 98.5 F (36.9 C), resp.  rate 16, height 5' 1"  (1.549 m), weight 104.3 kg, SpO2 96 %.   HEENT-  Normocephalic, no lesions, without obvious abnormality.  Normal external eye and conjunctiva.  Normal  TM's bilaterally.  Normal auditory canals and external ears. Normal external nose, mucus membranes and septum.  Normal pharynx. Cardiovascular- S1, S2 normal, pulses palpable throughout   Lungs- chest clear, no wheezing, rales, normal symmetric air entry Abdomen- soft, non-tender; bowel sounds normal; no masses,  no organomegaly Extremities- no edema Lymph-no adenopathy palpable Musculoskeletal-no joint tenderness, left hand contracture Skin-warm and dry, no hyperpigmentation, vitiligo, or suspicious lesions  Neurological Exam   Mental Status: Alert, oriented, thought content appropriate.  Speech slurred and dysarthric.  Able to follow 3 step commands without difficulty. Attention span and concentration seemed appropriate  Cranial Nerves: II: Discs flat bilaterally; LHH, pupils equal, round, reactive to light and accommodation III,IV, VI: ptosis not present, extra-ocular motions intact bilaterally V,VII: smile asymmetric mildly on the left, facial light touch sensation decreased on the left VIII: hearing normal bilaterally IX,X: gag reflex differed XI: bilateral shoulder shrug XII: midline tongue extension Motor: Right :  Upper extremity   5-/5 Without pronator drift      Left: Upper extremity   3/5 Right:   Lower extremity   5/5                                          Left: Lower extremity   3-/5 Increased tone on the left Sensory: Pinprick and light touch decreased on the left hand Deep Tendon Reflexes: 2+ and symmetric throughout Plantars: Right: mute                              Left: upgoing Cerebellar: Finger-to-nose testing and heel to chin intact on the right. Unable to perform on the left due to hemiparesis and contracture of hand. Gait: not tested due to safety concerns  Data Reviewed  Laboratory  Studies:  Basic Metabolic Panel: Recent Labs  Lab 03/31/18 1252  NA 141  K 3.9  CL 106  CO2 26  GLUCOSE 183*  BUN 27*  CREATININE 1.12*  CALCIUM 8.9    Liver Function Tests: Recent Labs  Lab 03/31/18 1252  AST 21  ALT 19  ALKPHOS 73  BILITOT 0.6  PROT 6.8  ALBUMIN 3.7   No results for input(s): LIPASE, AMYLASE in the last 168 hours. No results for input(s): AMMONIA in the last 168 hours.  CBC: Recent Labs  Lab 03/31/18 1252  WBC 6.4  NEUTROABS 4.0  HGB 11.2*  HCT 33.1*  MCV 87.7  PLT 220    Cardiac Enzymes: Recent Labs  Lab 03/31/18 1252  TROPONINI <0.03    BNP: Invalid input(s): POCBNP  CBG: Recent Labs  Lab 03/31/18 1249 03/31/18 2038 04/01/18 0744  GLUCAP 161* 89 221*    Microbiology: Results for orders placed or performed in visit on 03/19/18  Urine Culture     Status: None   Collection Time: 03/19/18 12:00 AM  Result Value Ref Range Status   Urine Culture, Routine Final report  Final   Organism ID, Bacteria Comment  Final    Comment: Mixed urogenital flora Less than 10,000 colonies/mL     Coagulation Studies: Recent Labs    03/31/18 1252  LABPROT 13.3  INR 1.02    Urinalysis:  Recent Labs  Lab 03/31/18 1722  COLORURINE STRAW*  LABSPEC 1.013  PHURINE 6.0  GLUCOSEU NEGATIVE  HGBUR SMALL*  BILIRUBINUR NEGATIVE  KETONESUR NEGATIVE  PROTEINUR NEGATIVE  NITRITE NEGATIVE  LEUKOCYTESUR TRACE*    Lipid Panel:    Component Value Date/Time   CHOL 102 04/01/2018 0458   CHOL 110 01/18/2014 0059   TRIG 198 (H) 04/01/2018 0458   TRIG 122 01/18/2014 0059   HDL 29 (L) 04/01/2018 0458   HDL 35 (L) 01/18/2014 0059   CHOLHDL 3.5 04/01/2018 0458   VLDL 40 04/01/2018 0458   VLDL 24 01/18/2014 0059   LDLCALC 33 04/01/2018 0458   LDLCALC 51 01/18/2014 0059    HgbA1C:  Lab Results  Component Value Date   HGBA1C 10.8 (A) 02/14/2018    Urine Drug Screen:  No results found for: LABOPIA, COCAINSCRNUR, LABBENZ, AMPHETMU,  THCU, LABBARB  Alcohol Level: No results for input(s): ETH in the last 168 hours.  Other results: EKG: normal EKG, normal sinus rhythm, unchanged from previous tracings.  Imaging: Ct Head Wo Contrast  Result Date: 03/31/2018 CLINICAL DATA:  Focal neuro deficit greater than 6 hours, stroke suspected. Weakness and LEFT-sided facial droop. EXAM: CT HEAD WITHOUT CONTRAST TECHNIQUE: Contiguous axial images were obtained from the base of the skull through the vertex without intravenous contrast. COMPARISON:  Head CT dated 10/03/2017.  Brain MRI dated 10/04/2017. FINDINGS: Brain: Ventricles are stable in size and configuration. Chronic ischemic changes again noted within the periventricular white matter regions bilaterally and LEFT basal ganglia with associated encephalomalacia. There is no mass, hemorrhage, edema or other evidence of acute parenchymal abnormality. No extra-axial hemorrhage. Vascular: Chronic calcified atherosclerotic changes of the large vessels at the skull base. No unexpected hyperdense vessel. Skull: Normal. Negative for fracture or focal lesion. Sinuses/Orbits: No acute finding. Other: None. IMPRESSION: 1. No acute findings.  No intracranial mass, hemorrhage or edema. 2. Chronic ischemic changes within the white matter and LEFT basal ganglia. Electronically Signed   By: Franki Cabot M.D.   On: 03/31/2018 13:35   Ct Cerebral Perfusion W Contrast  Result Date: 03/31/2018 CLINICAL DATA:  Awoke today with balance disturbance and weakness. Left facial droop. EXAM: CT PERFUSION BRAIN TECHNIQUE: Multiphase CT imaging of the brain was performed following IV bolus contrast injection. Subsequent parametric perfusion maps were calculated using RAPID software. CONTRAST:  62m ISOVUE-370 IOPAMIDOL (ISOVUE-370) INJECTION 76% COMPARISON:  CT same day. Multiple previous intracranial MR angiography exams as distant as 2010. FINDINGS: CT Brain Perfusion Findings: CBF (<30%) Volume: 035mPerfusion  (Tmax>6.0s) volume: 25m57mThe study does suggest a region of T-max greater than 4 seconds possibly corresponding with the distal anterior cerebral artery territory on the right. Mismatch Volume: 25mL33mPECTS on noncontrast CT Head: 10 at 1312 hours today. Infarct Core: 0 mL Infarction Location:None IMPRESSION: No finding to suggest completed infarction or T-max greater than 6 seconds. T-max greater than 4 seconds in the right anterior cerebral artery territory could represent right anterior cerebral insult. However, in reviewing an MR angiogram from March of this year, 2017 and as distant as 2010, there appears to be an abnormal appearance of the right anterior cerebral artery at that time and this could be due to chronic stenosis or occlusion of that vessel. If concern persists, consider CT angiography. Electronically Signed   By: MarkNelson Chimes.   On: 03/31/2018 15:48    Patient seen and examined.  Clinical course and management discussed.  Necessary edits performed.  I agree with the above.  Assessment and plan of care developed and discussed below.    Assessment: 62 y97. female presenting with worsening left sided weakness that is improving.  Initial imaging  was concerning and patient was admitted for further evaluation.  MRI of the brain performed today and reviewed.  It shows no acute changes.  Suspect TIA for worsening symptoms.  Patient on Plavix.  Had significant GIB on ASA and Plavix in the past.  Will continue to work up to identify further risk factors for optimization.   LDL 33.  A1c pending.  Stroke Risk Factors - diabetes mellitus, hyperlipidemia and hypertension  Plan: 1. CTA head and neck 2. Continue Plavix 75 mg/day and statin 3. PT consult, OT consult, Speech consult 4. Echocardiogram pending 5. NPO until RN stroke swallow screen 6. Telemetry monitoring 7. Frequent neuro checks   This patient was staffed with Dr. Magda Paganini, Doy Mince who personally evaluated patient, reviewed  documentation and agreed with assessment and plan of care as above.  Rufina Falco, DNP, FNP-BC Board certified Nurse Practitioner Neurology Department  04/01/2018, 8:18 AM   Alexis Goodell, MD Neurology [phone]  04/01/2018  3:13 PM

## 2018-04-01 NOTE — Progress Notes (Signed)
*  PRELIMINARY RESULTS* Echocardiogram 2D Echocardiogram has been performed.  Susan House 04/01/2018, 9:58 AM

## 2018-04-01 NOTE — Progress Notes (Addendum)
OT Cancellation Note  Patient Details Name: Susan House MRN: [id number] DOB: Mar 09, 1956   Cancelled Treatment:    Reason Eval/Treat Not Completed: Patient at procedure or test/ unavailable. Order received, chart reviewed. Pt out of room for testing. On 2nd attempt, pt still out of the room. Will re-attempt OT evaluation at later date/time as pt is available and medically appropriate.  Jeni Salles, MPH, MS, OTR/L ascom [phone] 04/01/18, 12:41 PM

## 2018-04-01 NOTE — Consult Note (Signed)
[facility] Vascular Consult Note  MRN : [id number]  Susan House is a 62 y.o. (07-06-1956) female who presents with chief complaint of  Chief Complaint  Patient presents with  . Dizziness   History of Present Illness:  The patient is a 62 year old female with a past medical history of venous insufficiency, restless leg syndrome, hemiplegia, vertigo, ataxia, past CVA, TIA, hyperlipidemia, carotid stenosis, essential hypertension, type 2 diabetes, depression, ischemic bowel disease, seizure who presented to the [facility] emergency department yesterday complaining of left-sided weakness.  The patient was at church on Sunday when her left-sided weakness began.  The patient also notes some right-sided weakness occurring at the same time.  At the time the patient began to experience "weakness", her husband notes that her speech was slightly slurred.  The patient and her husband deny any facial droop.  The patient does have a past medical history of CVA in 2000.  Before Sunday the patient denies experiencing any recent amaurosis fugax or neurological deficits. The patient notes a slight improvement in her left and right-sided weakness today.  The patient notes that her speech is not as slurred.   Vascular ultrasound of the bilateral carotid arteries on April 01, 2018: Stable appearance of moderate plaque at the right carotid bifurcation with estimated 50-69% proximal right ICA stenosis. Velocity and velocity ratio almost meet criteria for a greater than 70% stenosis. Stable appearance of plaque at the left carotid bifurcation with estimated less than 50% proximal left ICA stenosis.  CTA of the neck conducted on April 01, 2018: Right carotid system: Calcified plaque at the common carotid bifurcation and ICA bulb. ICA origin stenosis measures 60% on sagittal reformats. No beading or dissection seen. Left carotid system: Prominent calcified plaque  at the common carotid bifurcation. Proximal ICA stenosis measures 45% on coronal reformats. No downstream beading or dissection. Vertebral arteries: Bilateral proximal subclavian calcified plaque. On the right this stenosis measures up to 50% when measured on coronal reformats. Both vertebral arteries are smooth and widely patent to the dura.  Vascular surgery was consulted by Dr. Benjie Karvonen for further recommendations.  Current Facility-Administered Medications  Medication Dose Route Frequency Provider Last Rate Last Dose  . acetaminophen (TYLENOL) tablet 650 mg  650 mg Oral Q4H PRN Mayo, Pete Pelt, MD       Or  . acetaminophen (TYLENOL) solution 650 mg  650 mg Per Tube Q4H PRN Mayo, Pete Pelt, MD       Or  . acetaminophen (TYLENOL) suppository 650 mg  650 mg Rectal Q4H PRN Mayo, Pete Pelt, MD      . atorvastatin (LIPITOR) tablet 20 mg  20 mg Oral Daily Mayo, Pete Pelt, MD   20 mg at 04/01/18 1729  . clopidogrel (PLAVIX) tablet 75 mg  75 mg Oral Daily Mayo, Pete Pelt, MD   75 mg at 04/01/18 1115  . enoxaparin (LOVENOX) injection 40 mg  40 mg Subcutaneous Q24H Mayo, Pete Pelt, MD   40 mg at 03/31/18 2209  . famotidine (PEPCID) tablet 20 mg  20 mg Oral Daily Mayo, Pete Pelt, MD   20 mg at 03/31/18 2214  . HYDROcodone-acetaminophen (NORCO) 10-325 MG per tablet 1-2 tablet  1-2 tablet Oral Q6H PRN Mody, Sital, MD      . insulin aspart (novoLOG) injection 0-15 Units  0-15 Units Subcutaneous TID WC Mayo, Pete Pelt, MD   8 Units at 04/01/18 1728  . insulin aspart (novoLOG) injection 0-5 Units  0-5 Units Subcutaneous QHS Mayo, Pete Pelt, MD      . insulin glargine (LANTUS) injection 10 Units  10 Units Subcutaneous QHS Sela Hua, MD   10 Units at 03/31/18 2209  . levETIRAcetam (KEPPRA) tablet 500 mg  500 mg Oral BID Mayo, Pete Pelt, MD   500 mg at 04/01/18 1130  . oxybutynin (DITROPAN) tablet 5 mg  5 mg Oral BID Mayo, Pete Pelt, MD   5 mg at 04/01/18 1130  . pantoprazole (PROTONIX) EC tablet 40 mg  40 mg  Oral Daily Mayo, Pete Pelt, MD   40 mg at 04/01/18 1130  . senna-docusate (Senokot-S) tablet 1 tablet  1 tablet Oral QHS PRN Mayo, Pete Pelt, MD      . traMADol Veatrice Bourbon) tablet 50 mg  50 mg Oral Q6H PRN Bettey Costa, MD       Past Medical History:  Diagnosis Date  . Allergy   . Arthritis   . Cellulitis and abscess of face   . Depression   . Diabetes mellitus ([place])   . Edema   . GERD (gastroesophageal reflux disease)   . Hematuria   . History of echocardiogram    2008: normal, 2011: normal LVSF  . History of nuclear stress test    a. 12/2009: lexiscan - negative  . Hyperlipidemia   . Hypertension   . IBS (irritable bowel syndrome)   . Migraine   . Morbid obesity ([place])   . Nocturia   . Stroke ([place]) 2000  . TIA (transient ischemic attack) 2010  . Urgency of micturation   . Urinary frequency   . Urinary incontinence    Past Surgical History:  Procedure Laterality Date  . ABDOMINAL HYSTERECTOMY    . ABDOMINOPLASTY    . APPENDECTOMY    . BACK SURGERY     extensive spinal fusion  . carpel tunnel surgery    . CATARACT EXTRACTION     x 2  . CESAREAN SECTION    . CHOLECYSTECTOMY    . COLONOSCOPY  2010  . COLONOSCOPY WITH PROPOFOL N/A 07/11/2016   Procedure: COLONOSCOPY WITH PROPOFOL;  Surgeon: Lucilla Lame, MD;  Location: [facility] ENDOSCOPY;  Service: Endoscopy;  Laterality: N/A;  . COLONOSCOPY WITH PROPOFOL N/A 03/26/2018   Procedure: COLONOSCOPY WITH PROPOFOL;  Surgeon: Lucilla Lame, MD;  Location: [facility] ENDOSCOPY;  Service: Endoscopy;  Laterality: N/A;  . ESOPHAGOGASTRODUODENOSCOPY (EGD) WITH PROPOFOL N/A 03/26/2018   Procedure: ESOPHAGOGASTRODUODENOSCOPY (EGD) WITH PROPOFOL;  Surgeon: Lucilla Lame, MD;  Location: [facility] ENDOSCOPY;  Service: Endoscopy;  Laterality: N/A;  . EYE SURGERY    . HERNIA REPAIR    . SHOULDER SURGERY    . TOE SURGERY    . UPPER GI ENDOSCOPY    . WRIST FRACTURE SURGERY Left    Social History Social History   Tobacco Use  . Smoking status: Never Smoker   . Smokeless tobacco: Never Used  Substance Use Topics  . Alcohol use: No  . Drug use: No   Family History Family History  Problem Relation Age of Onset  . Hyperlipidemia Mother   . Arrhythmia Mother        WPW  . Hypertension Mother   . Rheum arthritis Mother   . Heart disease Mother   . Heart attack Father 63  . Hyperlipidemia Father   . Hypertension Father   . Stroke Father   . Diabetes Father   . Heart disease Father   . Coronary artery disease Father   . Diabetes  Sister   . Hyperlipidemia Sister   . Hypertension Sister   . Depression Sister   . Depression Sister   . Diabetes Sister   . Hypertension Sister   . Hyperlipidemia Sister   . Kidney disease Paternal Grandmother   The patient denies a family history of venous disease, peripheral artery disease or bleeding / clotting disorders.  Allergies  Allergen Reactions  . Morphine And Related Anaphylaxis  . Fentanyl Other (See Comments)    Unknown reaction.  . Reglan [Metoclopramide]     Other reaction(s): Unknown Elevated BP  . Simvastatin Other (See Comments)    Muscle pain  . Betadine [Povidone Iodine] Rash  . Iodine Rash    Other reaction(s): Unknown-not allergic to ct contrast-ars 04/01/18  . Tetracyclines & Related Rash    Other reaction(s): Unknown   REVIEW OF SYSTEMS (Negative unless checked)  Constitutional: [] Weight loss  [] Fever  [] Chills Cardiac: [] Chest pain   [] Chest pressure   [] Palpitations   [] Shortness of breath when laying flat   [] Shortness of breath at rest   [] Shortness of breath with exertion. Vascular:  [] Pain in legs with walking   [] Pain in legs at rest   [] Pain in legs when laying flat   [] Claudication   [] Pain in feet when walking  [] Pain in feet at rest  [] Pain in feet when laying flat   [] History of DVT   [] Phlebitis   [] Swelling in legs   [] Varicose veins   [] Non-healing ulcers Pulmonary:   [] Uses home oxygen   [] Productive cough   [] Hemoptysis   [] Wheeze  [] COPD    [] Asthma Neurologic:  [] Dizziness  [] Blackouts   [] Seizures   [x] History of stroke   [x] History of TIA  [x] Aphasia   [] Temporary blindness   [] Dysphagia   [x] Weakness or numbness in arms   [x] Weakness or numbness in legs Musculoskeletal:  [] Arthritis   [] Joint swelling   [] Joint pain   [] Low back pain Hematologic:  [] Easy bruising  [] Easy bleeding   [] Hypercoagulable state   [] Anemic  [] Hepatitis Gastrointestinal:  [] Blood in stool   [] Vomiting blood  [x] Gastroesophageal reflux/heartburn   [] Difficulty swallowing. Genitourinary:  [x] Chronic kidney disease   [] Difficult urination  [] Frequent urination  [] Burning with urination   [] Blood in urine Skin:  [] Rashes   [] Ulcers   [] Wounds Psychological:  [] History of anxiety   [x]  History of major depression.  Physical Examination  Vitals:   04/01/18 0435 04/01/18 0612 04/01/18 0803 04/01/18 1354  BP: (!) 125/46 (!) 142/52 (!) 140/45 (!) 148/56  Pulse: (!) 57 (!) 59 62 (!) 57  Resp: 13 16  18   Temp:  98.5 F (36.9 C)  98.1 F (36.7 C)  TempSrc:      SpO2: 98% 97% 96% 100%  Weight:      Height:       Body mass index is 43.46 kg/m. Gen:  WD/WN, NAD Head: [place]/AT, No temporalis wasting. Prominent temp pulse not noted. Ear/Nose/Throat: Hearing grossly intact, nares w/o erythema or drainage, oropharynx w/o Erythema/Exudate Eyes: Sclera non-icteric, conjunctiva clear Neck: Trachea midline.  No JVD.  No carotid bruits auscultated. Pulmonary:  Good air movement, respirations not labored, equal bilaterally.  Cardiac: RRR, normal S1, S2. Vascular:  Vessel Right Left  Radial Palpable Palpable  Ulnar Palpable Palpable  Brachial Palpable Palpable  Carotid Palpable, without bruit Palpable, without bruit  Aorta Not palpable N/A  Femoral Palpable Palpable  Popliteal Palpable Palpable  PT Palpable Palpable  DP Palpable Palpable   Gastrointestinal: soft,  non-tender/non-distended. No guarding/reflex.  Musculoskeletal: Upper extremity slightly  weaker when compared to lower extremity.  Extremities without ischemic changes.  No deformity or atrophy. No edema. Neurologic: Sensation grossly intact in extremities.  Symmetrical.  Speech is fluent -very minimal slurred speech. Motor exam as listed above. Psychiatric: Judgment intact, Mood & affect appropriate for pt's clinical situation. Dermatologic: No rashes or ulcers noted.  No cellulitis or open wounds. Lymph : No Cervical, Axillary, or Inguinal lymphadenopathy.  CBC Lab Results  Component Value Date   WBC 6.4 03/31/2018   HGB 11.2 (L) 03/31/2018   HCT 33.1 (L) 03/31/2018   MCV 87.7 03/31/2018   PLT 220 03/31/2018   BMET    Component Value Date/Time   NA 141 03/31/2018 1252   NA 139 12/25/2017 1600   NA 140 06/17/2014 0744   K 3.9 03/31/2018 1252   K 4.1 06/17/2014 0744   CL 106 03/31/2018 1252   CL 105 06/17/2014 0744   CO2 26 03/31/2018 1252   CO2 27 06/17/2014 0744   GLUCOSE 183 (H) 03/31/2018 1252   GLUCOSE 116 (H) 06/17/2014 0744   BUN 27 (H) 03/31/2018 1252   BUN 14 12/25/2017 1600   BUN 35 (H) 06/17/2014 0744   CREATININE 1.12 (H) 03/31/2018 1252   CREATININE 1.29 06/17/2014 0744   CALCIUM 8.9 03/31/2018 1252   CALCIUM 8.6 06/17/2014 0744   GFRNONAA 52 (L) 03/31/2018 1252   GFRNONAA 45 (L) 06/17/2014 0744   GFRNONAA 47 (L) 01/18/2014 0059   GFRAA 60 (L) 03/31/2018 1252   GFRAA 55 (L) 06/17/2014 0744   GFRAA 55 (L) 01/18/2014 0059   Estimated Creatinine Clearance: 57.9 mL/min (A) (by C-G formula based on SCr of 1.12 mg/dL (H)).  COAG Lab Results  Component Value Date   INR 1.02 03/31/2018   INR 0.95 10/03/2017   INR 0.99 12/26/2015   Radiology Ct Angio Head W Or Wo Contrast  Result Date: 04/01/2018 CLINICAL DATA:  Left-sided weakness.  Stroke workup. EXAM: CT ANGIOGRAPHY HEAD AND NECK TECHNIQUE: Multidetector CT imaging of the head and neck was performed using the standard protocol during bolus administration of intravenous contrast. Multiplanar  CT image reconstructions and MIPs were obtained to evaluate the vascular anatomy. Carotid stenosis measurements (when applicable) are obtained utilizing NASCET criteria, using the distal internal carotid diameter as the denominator. CONTRAST:  4mL OMNIPAQUE IOHEXOL 350 MG/ML SOLN COMPARISON:  Head CT from yesterday FINDINGS: CT HEAD FINDINGS Brain: Remote left lateral lenticulostriate infarct affecting left basal ganglia and regional white matter. Atrophy, most notably of the cerebellum. Vascular: See below. Skull: No acute finding Sinuses: Negative Orbits: Probable remote blowout fracture on the right. No acute finding. Review of the MIP images confirms the above findings CTA NECK FINDINGS Aortic arch: Atherosclerotic plaque.  Three vessel branching. Right carotid system: Calcified plaque at the common carotid bifurcation and ICA bulb. ICA origin stenosis measures 60% on sagittal reformats. No beading or dissection seen. Left carotid system: Prominent calcified plaque at the common carotid bifurcation. Proximal ICA stenosis measures 45% on coronal reformats. No downstream beading or dissection. Vertebral arteries: Bilateral proximal subclavian calcified plaque. On the right this stenosis measures up to 50% when measured on coronal reformats. Both vertebral arteries are smooth and widely patent to the dura. Skeleton: Remote nonunited right clavicle fracture. Cervical disc degeneration. Osteopenia Other neck: No evidence of mass or adenopathy. Upper chest: Negative Review of the MIP images confirms the above findings CTA HEAD FINDINGS Anterior circulation: Confluent calcified plaque on  the carotid siphons. Plaque blooming limits quantification of stenosis; no focal stenosis is suspected. Cut off of right A2, which correlates with history of leg symptoms. MCA branches are symmetrically patent. Best seen on MIPS there is prominent, asymmetric collaterals extending over the right cerebral convexity. These can be  compensatory in the acute or chronic setting. Posterior circulation: Confluent calcified plaque on the V4 segments with mild generalized luminal narrowing. The vertebral and basilar arteries are diffusely smooth. Asymmetrically small right PCA without evidence of focal inter lying stenosis. Question if there is duplicated right PCA from the posterior communicating artery. Venous sinuses: Patent Anatomic variants: As above Delayed phase: No abnormal intracranial enhancement Review of the MIP images confirms the above findings IMPRESSION: 1. Right A2 cut off/occlusion with prominent collaterals extending over the right cerebral convexity. This correlates well with history of left leg weakness. No detected infarct by CT. 2. Carotid bifurcation atherosclerosis leading to 60% proximal ICA stenosis on the left and 45% on the right. 3. 50% narrowing in the proximal right subclavian. Electronically Signed   By: Monte Fantasia M.D.   On: 04/01/2018 13:26   Ct Head Wo Contrast  Result Date: 03/31/2018 CLINICAL DATA:  Focal neuro deficit greater than 6 hours, stroke suspected. Weakness and LEFT-sided facial droop. EXAM: CT HEAD WITHOUT CONTRAST TECHNIQUE: Contiguous axial images were obtained from the base of the skull through the vertex without intravenous contrast. COMPARISON:  Head CT dated 10/03/2017.  Brain MRI dated 10/04/2017. FINDINGS: Brain: Ventricles are stable in size and configuration. Chronic ischemic changes again noted within the periventricular white matter regions bilaterally and LEFT basal ganglia with associated encephalomalacia. There is no mass, hemorrhage, edema or other evidence of acute parenchymal abnormality. No extra-axial hemorrhage. Vascular: Chronic calcified atherosclerotic changes of the large vessels at the skull base. No unexpected hyperdense vessel. Skull: Normal. Negative for fracture or focal lesion. Sinuses/Orbits: No acute finding. Other: None. IMPRESSION: 1. No acute findings.  No  intracranial mass, hemorrhage or edema. 2. Chronic ischemic changes within the white matter and LEFT basal ganglia. Electronically Signed   By: Franki Cabot M.D.   On: 03/31/2018 13:35   Ct Angio Neck W Or Wo Contrast  Result Date: 04/01/2018 CLINICAL DATA:  Left-sided weakness.  Stroke workup. EXAM: CT ANGIOGRAPHY HEAD AND NECK TECHNIQUE: Multidetector CT imaging of the head and neck was performed using the standard protocol during bolus administration of intravenous contrast. Multiplanar CT image reconstructions and MIPs were obtained to evaluate the vascular anatomy. Carotid stenosis measurements (when applicable) are obtained utilizing NASCET criteria, using the distal internal carotid diameter as the denominator. CONTRAST:  69mL OMNIPAQUE IOHEXOL 350 MG/ML SOLN COMPARISON:  Head CT from yesterday FINDINGS: CT HEAD FINDINGS Brain: Remote left lateral lenticulostriate infarct affecting left basal ganglia and regional white matter. Atrophy, most notably of the cerebellum. Vascular: See below. Skull: No acute finding Sinuses: Negative Orbits: Probable remote blowout fracture on the right. No acute finding. Review of the MIP images confirms the above findings CTA NECK FINDINGS Aortic arch: Atherosclerotic plaque.  Three vessel branching. Right carotid system: Calcified plaque at the common carotid bifurcation and ICA bulb. ICA origin stenosis measures 60% on sagittal reformats. No beading or dissection seen. Left carotid system: Prominent calcified plaque at the common carotid bifurcation. Proximal ICA stenosis measures 45% on coronal reformats. No downstream beading or dissection. Vertebral arteries: Bilateral proximal subclavian calcified plaque. On the right this stenosis measures up to 50% when measured on coronal reformats. Both vertebral arteries  are smooth and widely patent to the dura. Skeleton: Remote nonunited right clavicle fracture. Cervical disc degeneration. Osteopenia Other neck: No evidence of  mass or adenopathy. Upper chest: Negative Review of the MIP images confirms the above findings CTA HEAD FINDINGS Anterior circulation: Confluent calcified plaque on the carotid siphons. Plaque blooming limits quantification of stenosis; no focal stenosis is suspected. Cut off of right A2, which correlates with history of leg symptoms. MCA branches are symmetrically patent. Best seen on MIPS there is prominent, asymmetric collaterals extending over the right cerebral convexity. These can be compensatory in the acute or chronic setting. Posterior circulation: Confluent calcified plaque on the V4 segments with mild generalized luminal narrowing. The vertebral and basilar arteries are diffusely smooth. Asymmetrically small right PCA without evidence of focal inter lying stenosis. Question if there is duplicated right PCA from the posterior communicating artery. Venous sinuses: Patent Anatomic variants: As above Delayed phase: No abnormal intracranial enhancement Review of the MIP images confirms the above findings IMPRESSION: 1. Right A2 cut off/occlusion with prominent collaterals extending over the right cerebral convexity. This correlates well with history of left leg weakness. No detected infarct by CT. 2. Carotid bifurcation atherosclerosis leading to 60% proximal ICA stenosis on the left and 45% on the right. 3. 50% narrowing in the proximal right subclavian. Electronically Signed   By: Monte Fantasia M.D.   On: 04/01/2018 13:26   Mr Brain Wo Contrast  Result Date: 04/01/2018 CLINICAL DATA:  Stroke follow-up. Slurred speech and left arm and leg weakness. EXAM: MRI HEAD WITHOUT CONTRAST TECHNIQUE: Multiplanar, multiecho pulse sequences of the brain and surrounding structures were obtained without intravenous contrast. COMPARISON:  Head CT/CTA 04/01/2018 Brain MRI 10/04/2017 FINDINGS: BRAIN: There is no acute infarct, acute hemorrhage or mass effect. The midline structures are normal. Old left frontal lobe  infarct and multiple old gangliothalamic lacunar infarct. There is ex vacuo dilatation of the left lateral ventricle. Aside from the sites of prior infarcts, minimal periventricular white matter hyperintensity. Atrophy is greater than expected for age. Susceptibility-sensitive sequences show no chronic microhemorrhage or superficial siderosis. VASCULAR: Major intracranial arterial and venous sinus flow voids are preserved. SKULL AND UPPER CERVICAL SPINE: The visualized skull base, calvarium, upper cervical spine and extracranial soft tissues are normal. SINUSES/ORBITS: No fluid levels or advanced mucosal thickening. No mastoid or middle ear effusion. The orbits are normal. IMPRESSION: 1. No acute intracranial abnormality. 2. 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 Ex vacuo dilatation of the left lateral ventricle and generalized mildly age advanced atrophy. Electronically Signed   By: Ulyses Jarred M.D.   On: 04/01/2018 13:37   US Carotid Bilateral (at [facility] And [facility] Only)  Result Date: 04/01/2018 CLINICAL DATA:  Left-sided weakness and slurred speech. History of hypertension, hyperlipidemia and diabetes. EXAM: BILATERAL CAROTID DUPLEX ULTRASOUND TECHNIQUE: Pearline Cables scale imaging, color Doppler and duplex ultrasound were performed of bilateral carotid and vertebral arteries in the neck. COMPARISON:  10/04/2017 FINDINGS: Criteria: Quantification of carotid stenosis is based on velocity parameters that correlate the residual internal carotid diameter with NASCET-based stenosis levels, using the diameter of the distal internal carotid lumen as the denominator for stenosis measurement. The following velocity measurements were obtained: RIGHT ICA:  196/37 cm/sec CCA:  47/42 cm/sec SYSTOLIC ICA/CCA RATIO:  3.1 ECA:  197 cm/sec LEFT ICA:  75/17 cm/sec CCA:  59/56 cm/sec SYSTOLIC ICA/CCA RATIO:  1.3 ECA:  153 cm/sec RIGHT CAROTID ARTERY: Similar and stable appearance of moderate amount of calcified plaque at  the  level of the distal common carotid artery, carotid bulb and extending into the proximal internal and external carotid arteries. Turbulent flow present in the proximal right ICA with stable velocities corresponding to an estimated 50-69% stenosis. Velocities and systolic ratio almost meet criteria for a greater than 70% stenosis. RIGHT VERTEBRAL ARTERY: Antegrade flow with normal waveform and velocity. LEFT CAROTID ARTERY: Stable moderate calcified plaque at the level of the carotid bulb and proximal left ICA. Velocities are stable and estimated left ICA stenosis remains less than 50%. LEFT VERTEBRAL ARTERY: Antegrade flow with normal waveform and velocity. IMPRESSION: 1. Stable appearance of moderate plaque at the right carotid bifurcation with estimated 50-69% proximal right ICA stenosis. Velocity and velocity ratio almost meet criteria for a greater than 70% stenosis. 2. Stable appearance of plaque at the left carotid bifurcation with estimated less than 50% proximal left ICA stenosis. Electronically Signed   By: Aletta Edouard M.D.   On: 04/01/2018 09:33   Ct Cerebral Perfusion W Contrast  Result Date: 03/31/2018 CLINICAL DATA:  Awoke today with balance disturbance and weakness. Left facial droop. EXAM: CT PERFUSION BRAIN TECHNIQUE: Multiphase CT imaging of the brain was performed following IV bolus contrast injection. Subsequent parametric perfusion maps were calculated using RAPID software. CONTRAST:  67mL ISOVUE-370 IOPAMIDOL (ISOVUE-370) INJECTION 76% COMPARISON:  CT same day. Multiple previous intracranial MR angiography exams as distant as 2010. FINDINGS: CT Brain Perfusion Findings: CBF (<30%) Volume: 45mL Perfusion (Tmax>6.0s) volume: 98mL. The study does suggest a region of T-max greater than 4 seconds possibly corresponding with the distal anterior cerebral artery territory on the right. Mismatch Volume: 58mL ASPECTS on noncontrast CT Head: 10 at 1312 hours today. Infarct Core: 0 mL Infarction  Location:None IMPRESSION: No finding to suggest completed infarction or T-max greater than 6 seconds. T-max greater than 4 seconds in the right anterior cerebral artery territory could represent right anterior cerebral insult. However, in reviewing an MR angiogram from March of this year, 2017 and as distant as 2010, there appears to be an abnormal appearance of the right anterior cerebral artery at that time and this could be due to chronic stenosis or occlusion of that vessel. If concern persists, consider CT angiography. Electronically Signed   By: Nelson Chimes M.D.   On: 03/31/2018 15:48   Assessment/Plan The patient is a 62 year old female with a past medical history of venous insufficiency, restless leg syndrome, hemiplegia, vertigo, ataxia, past CVA, TIA, hyperlipidemia, carotid stenosis, essential hypertension, type 2 diabetes, depression, ischemic bowel disease admitted to the [facility] emergency department with an acute CVA - Stable 1. Carotid Stenosis: Patient does not have critical carotid stenosis noted on CTA of the neck.  The patients current stenosis is unlikely the source of the patient's CVA.  At this time, there is no indication for vascular intervention.  The patient should follow-up with [place] as an outpatient - recommend follow-up in 3 months with a carotid ultrasound.  Would recommend maximal medical management.  Patient is currently on statin and Plavix.  Would add aspirin daily. 2.  Hyperlipidemia: Encouraged good control as its slows the progression of atherosclerotic disease 3.  Diabetes: Encouraged good control as its slows the progression of atherosclerotic disease 4.  Hypertension: Encouraged good control as its slows the progression of atherosclerotic disease  Discussed with Dr. Mayme Genta, PA-C  04/01/2018 5:35 PM  This note was created with Dragon medical transcription system.  Any error is purely unintentional.

## 2018-04-02 ENCOUNTER — Telehealth: Payer: Self-pay | Admitting: Family Medicine

## 2018-04-02 ENCOUNTER — Other Ambulatory Visit: Payer: Self-pay

## 2018-04-02 DIAGNOSIS — G459 Transient cerebral ischemic attack, unspecified: Secondary | ICD-10-CM | POA: Diagnosis not present

## 2018-04-02 DIAGNOSIS — G43809 Other migraine, not intractable, without status migrainosus: Secondary | ICD-10-CM | POA: Diagnosis not present

## 2018-04-02 DIAGNOSIS — I10 Essential (primary) hypertension: Secondary | ICD-10-CM | POA: Diagnosis not present

## 2018-04-02 DIAGNOSIS — E119 Type 2 diabetes mellitus without complications: Secondary | ICD-10-CM | POA: Diagnosis not present

## 2018-04-02 DIAGNOSIS — E785 Hyperlipidemia, unspecified: Secondary | ICD-10-CM | POA: Diagnosis not present

## 2018-04-02 DIAGNOSIS — R531 Weakness: Secondary | ICD-10-CM | POA: Diagnosis not present

## 2018-04-02 LAB — HEMOGLOBIN A1C
Hgb A1c MFr Bld: 9.5 % — ABNORMAL HIGH (ref 4.8–5.6)
MEAN PLASMA GLUCOSE: 226 mg/dL

## 2018-04-02 LAB — GLUCOSE, CAPILLARY
GLUCOSE-CAPILLARY: 211 mg/dL — AB (ref 70–99)
Glucose-Capillary: 206 mg/dL — ABNORMAL HIGH (ref 70–99)

## 2018-04-02 LAB — HIV ANTIBODY (ROUTINE TESTING W REFLEX): HIV Screen 4th Generation wRfx: NONREACTIVE

## 2018-04-02 MED ORDER — ASPIRIN 81 MG PO CHEW: 81.0000 mg | CHEWABLE_TABLET | Freq: Every day | ORAL | 0 refills | Status: DC

## 2018-04-02 NOTE — Plan of Care (Signed)
  Problem: Education: Goal: Knowledge of General Education information will improve Description: Including pain rating scale, medication(s)/side effects and non-pharmacologic comfort measures Outcome: Progressing   Problem: Health Behavior/Discharge Planning: Goal: Ability to manage health-related needs will improve Outcome: Progressing   Problem: Clinical Measurements: Goal: Ability to maintain clinical measurements within normal limits will improve Outcome: Progressing Goal: Will remain free from infection Outcome: Progressing Goal: Diagnostic test results will improve Outcome: Progressing Goal: Respiratory complications will improve Outcome: Progressing Goal: Cardiovascular complication will be avoided Outcome: Progressing   Problem: Activity: Goal: Risk for activity intolerance will decrease Outcome: Progressing   Problem: Nutrition: Goal: Adequate nutrition will be maintained Outcome: Progressing   Problem: Coping: Goal: Level of anxiety will decrease Outcome: Progressing   Problem: Elimination: Goal: Will not experience complications related to bowel motility Outcome: Progressing Goal: Will not experience complications related to urinary retention Outcome: Progressing   Problem: Pain Managment: Goal: General experience of comfort will improve Outcome: Progressing   Problem: Safety: Goal: Ability to remain free from injury will improve Outcome: Progressing   Problem: Skin Integrity: Goal: Risk for impaired skin integrity will decrease Outcome: Progressing   Problem: Education: Goal: Knowledge of secondary prevention will improve Outcome: Progressing Goal: Knowledge of patient specific risk factors addressed and post discharge goals established will improve Outcome: Progressing Goal: Individualized Educational Video(s) Outcome: Progressing   

## 2018-04-02 NOTE — Progress Notes (Signed)
Pt was able to stand at sink and participate in bathing, peri care.  Pt has ROM with both legs and purposeful movement with left arm. Dorna Bloom RN

## 2018-04-02 NOTE — ED Notes (Signed)
Pt rechecked, co persistent headache no change in condition.

## 2018-04-02 NOTE — Progress Notes (Signed)
SLP Cancellation Note  Patient Details Name: Susan House MRN: [id number] DOB: 09-Nov-1955   Cancelled treatment:       Reason Eval/Treat Not Completed: SLP screened, no needs identified, will sign off. Patient stated that slurred speech "has improved" and returned to baseline. Patient denied changes in cognition and/or language skills. No word-finding deficits noted during conversation. Patient able to answer y/n and wh-ques appropriately during informal conversation and sustain attention appropriately. VERY mild slurred speech noted, however dentures were not present in patients oral cavity, potentially impacting articulation. Patient reported no difficulty in swallowing on current diet and stated that she placed dentures in oral cavity before PO intake. Dentures observed in container next to kitchen in room. NSG reported patient consuming current diet and medications without difficulty. At this time, ST services not indicated.   Loni Beckwith, M.S. CCC-SLP Speech-Language Pathologist  Loni Beckwith 04/02/2018, 9:16 AM

## 2018-04-02 NOTE — Telephone Encounter (Signed)
Just an Pharmacist, hospital for you [facility]

## 2018-04-02 NOTE — Telephone Encounter (Signed)
Pt is scheduled for hospital f/u on 04/11/18. Pt is being discharged today 04/02/18. Please advise. Thanks TNP

## 2018-04-02 NOTE — Care Management Note (Signed)
Case Management Note  Patient Details  Name: Susan House MRN: [id number] Date of Birth: 18-May-1956  Subjective/Objective:  Admitted with left sided weakness and CVA/TIA. PT recommending SNF but patient is refusing. She wants to go home with the care of her spouse and family. She has a walker and bsc. Offered a list of home health agencies. She prefers [place]. Referral to [facility] with [facility] for RN, PT and OT. Patient discharging today. She will discharge home by car. PCP is Dr. Rosanna Randy.                       Action/Plan:   Expected Discharge Date:  04/02/18               Expected Discharge Plan:  [place]  In-House Referral:     Discharge planning Services  CM Consult  Post Acute Care Choice:  Home Health Choice offered to:  Patient  DME Arranged:    DME Agency:     HH Arranged:  RN, PT, OT HH Agency:  [facility]  Status of Service:  Completed, signed off  If discussed at [place] of Stay Meetings, dates discussed:    Additional Comments:  Jolly Mango, RN 04/02/2018, 9:27 AM

## 2018-04-02 NOTE — Progress Notes (Signed)
OT Cancellation Note  Patient Details Name: Susan House MRN: [id number] DOB: 25-Jul-1955   Cancelled Treatment:    Reason Eval/Treat Not Completed: Other (comment). Spoke with RN. Pt preparing for discharge today. Home health services have been ordered. Will hold acute setting OT evaluation this date and re-attempt as appropriate should pt not discharge.   Jeni Salles, MPH, MS, OTR/L ascom [phone] 04/02/18, 1:05 PM

## 2018-04-02 NOTE — ED Triage Notes (Addendum)
Pt in with co headache states hx of migraines and has hx of the same. Pt states it feels like her regular migraines. Pt was discharged today after being admitted x2 days ago for possible stroke. States tests showed she did not have a stroke.

## 2018-04-02 NOTE — Discharge Summary (Addendum)
[facility] NAME: Susan House    MR#:  [id number]  DATE OF BIRTH:  Nov 26, 1955  DATE OF ADMISSION:  03/31/2018 ADMITTING PHYSICIAN: Sela Hua, MD  DATE OF DISCHARGE: 04/02/2018  PRIMARY CARE PHYSICIAN: Jerrol Banana., MD    ADMISSION DIAGNOSIS:  Weakness [R53.1] Cerebrovascular accident (CVA), unspecified mechanism ([place]) [I63.9]  DISCHARGE DIAGNOSIS:  Active Problems:   Left-sided weakness  TIA  SECONDARY DIAGNOSIS:   Past Medical History:  Diagnosis Date  . Allergy   . Arthritis   . Cellulitis and abscess of face   . Depression   . Diabetes mellitus ([place])   . Edema   . GERD (gastroesophageal reflux disease)   . Hematuria   . History of echocardiogram    2008: normal, 2011: normal LVSF  . History of nuclear stress test    a. 12/2009: lexiscan - negative  . Hyperlipidemia   . Hypertension   . IBS (irritable bowel syndrome)   . Migraine   . Morbid obesity ([place])   . Nocturia   . Stroke ([place]) 2000  . TIA (transient ischemic attack) 2010  . Urgency of micturation   . Urinary frequency   . Urinary incontinence     HOSPITAL COURSE:  62 year old female with a history of CVA and left-sided residual deficit, diabetes and hyperlipidemia presented to the ER with left arm/leg weakness and slurred speech.  1.    TIA with worsening left-sided weakness Patient underwent MRI which is negative for acute stroke.  Her symptoms have resolved.  It appears that patient may have had a TIA.  Carotid Dopplers and CTA of the neck were ordered with results of CTA showing Right carotid system: Calcified plaque at the common carotid bifurcation and ICA bulb. ICA origin stenosis measures 60% on sagittal reformats. No beading or dissection seen. Left carotid system: Prominent calcified plaque at the common carotid bifurcation. Proximal ICA stenosis measures 45% on coronal reformats. No downstream beading or dissection.  Vertebral arteries: Bilateral proximal subclavian calcified plaque. On the right this stenosis measures up to 50% when measured on coronal reformats. Both vertebral arteries are smooth and widely patent to the dura.  He was evaluated by both neurology and vascular surgery. She has not approach critical carotid stenosis.  She will have outpatient follow-up with Dr. Lucky Cowboy in 3 months.  She will follow-up with her neurologist in 2 weeks.  She will be discharged on Plavix and statin. GI bleed on aspirin and Plavix combined and therefore will not be discharged on both of these. She wishes to go home with home health which was set up prior to discharge. Echocardiogram showed normal ejection fraction with diastolic dysfunction and no source of thrombi. LDL 33 which is at goal  2.  Diabetes: She needs close follow-up for diabetes.  Hemoglobin A1c is 9.1.  3.  Hypertension: She may resume outpatient medications.  Initially blood pressure medications were held due to low blood pressure and to allow perfusion to the brain.  4.  History of seizures: Continue Keppra  5.  Hyperlipidemia: Continue statin   DISCHARGE CONDITIONS AND DIET:   Stable for discharge on diabetic heart healthy diet  CONSULTS OBTAINED:  Treatment Team:  Alexis Goodell, MD  DRUG ALLERGIES:   Allergies  Allergen Reactions  . Morphine And Related Anaphylaxis  . Fentanyl Other (See Comments)    Unknown reaction.  . Reglan [Metoclopramide]     Other reaction(s): Unknown Elevated  BP  . Simvastatin Other (See Comments)    Muscle pain  . Betadine [Povidone Iodine] Rash  . Iodine Rash    Other reaction(s): Unknown-not allergic to ct contrast-ars 04/01/18  . Tetracyclines & Related Rash    Other reaction(s): Unknown    DISCHARGE MEDICATIONS:   Allergies as of 04/02/2018      Reactions   Morphine And Related Anaphylaxis   Fentanyl Other (See Comments)   Unknown reaction.   Reglan [metoclopramide]    Other  reaction(s): Unknown Elevated BP   Simvastatin Other (See Comments)   Muscle pain   Betadine [povidone Iodine] Rash   Iodine Rash   Other reaction(s): Unknown-not allergic to ct contrast-ars 04/01/18   Tetracyclines & Related Rash   Other reaction(s): Unknown      Medication List    STOP taking these medications   cephALEXin 500 MG capsule Commonly known as:  KEFLEX     TAKE these medications   ACCU-CHEK AVIVA PLUS w/Device Kit Check sugar three times daily DX E11.9-needs a meter   accu-chek softclix lancets Check sugar three times daily DX E11.9   ACCU-CHEK SOFTCLIX LANCETS lancets CHECK BLOOD SUGAR THREE TIMES DAILY   amLODipine 10 MG tablet Commonly known as:  NORVASC Take 1 tablet (10 mg total) by mouth daily.   atorvastatin 20 MG tablet Commonly known as:  LIPITOR Take 20 mg by mouth daily.   cloNIDine 0.3 MG tablet Commonly known as:  CATAPRES Take 1 tablet (0.3 mg total) by mouth 2 (two) times daily.   clopidogrel 75 MG tablet Commonly known as:  PLAVIX Take 1 tablet (75 mg total) by mouth daily.   cyclobenzaprine 5 MG tablet Commonly known as:  FLEXERIL Take 1 tablet (5 mg total) by mouth 3 (three) times daily as needed for muscle spasms.   diphenoxylate-atropine 2.5-0.025 MG tablet Commonly known as:  LOMOTIL 2 tablets initially and then 1 tablet every 6 hours as needed for diarrhea   enalapril 20 MG tablet Commonly known as:  VASOTEC Take 1 tablet (20 mg total) by mouth 2 (two) times daily.   glucose blood test strip TEST BLOOD SUGAR THREE TIMES DAILY   hydrALAZINE 100 MG tablet Commonly known as:  APRESOLINE Take 1 tablet (100 mg total) by mouth 2 (two) times daily.   HYDROcodone-acetaminophen 10-325 MG tablet Commonly known as:  NORCO Take 1-2 tablets by mouth every 6 (six) hours as needed.   insulin aspart protamine- aspart (70-30) 100 UNIT/ML injection Commonly known as:  NOVOLOG MIX 70/30 Inject 10-35 Units into the skin 2 (two)  times daily.   levETIRAcetam 500 MG tablet Commonly known as:  KEPPRA Take 1 tablet (500 mg total) by mouth 2 (two) times daily. 500 mg bid.   meclizine 25 MG tablet Commonly known as:  ANTIVERT Take 1 tablet (25 mg total) by mouth 3 (three) times daily as needed for dizziness.   metFORMIN 1000 MG tablet Commonly known as:  GLUCOPHAGE Take 1 tablet (1,000 mg total) by mouth 2 (two) times daily.   metoprolol succinate 100 MG 24 hr tablet Commonly known as:  TOPROL-XL Take 1 tablet (100 mg total) by mouth daily.   nitroGLYCERIN 0.4 MG SL tablet Commonly known as:  NITROSTAT Place 1 tablet (0.4 mg total) under the tongue every 5 (five) minutes as needed for chest pain.   nystatin cream Commonly known as:  MYCOSTATIN Apply 1 application topically 2 (two) times daily.   oxybutynin 5 MG tablet Commonly known as:  DITROPAN Take 1 tablet (5 mg total) by mouth 2 (two) times daily.   pantoprazole 40 MG tablet Commonly known as:  PROTONIX Take 1 tablet (40 mg total) by mouth daily.   ranitidine 150 MG capsule Commonly known as:  ZANTAC Take 1 capsule (150 mg total) by mouth every evening.   triamcinolone cream 0.1 % Commonly known as:  KENALOG Can use thin layer on affected areas 2x daily for one week at a time. Not for face.            Durable Medical Equipment  (From admission, onward)         Start     Ordered   04/02/18 0823  For home use only DME Gilford Rile  [facility])  Once    Question:  Patient needs a walker to treat with the following condition  Answer:  TIA (transient ischemic attack)   04/02/18 0823            Today   CHIEF COMPLAINT:  Patient is doing well.  She is able to move all extremities.  She is at her baseline left-sided upper extremity weakness   VITAL SIGNS:  Blood pressure (!) 162/61, pulse (!) 59, temperature 98.1 F (36.7 C), temperature source Oral, resp. rate 20, height _0  (1.549 m), weight 104.3 kg, SpO2 96 %.   REVIEW OF  SYSTEMS:  Review of Systems  Constitutional: Negative.  Negative for chills, fever and malaise/fatigue.  HENT: Negative.  Negative for ear discharge, ear pain, hearing loss, nosebleeds and sore throat.   Eyes: Negative for blurred vision and pain.  Respiratory: Negative.  Negative for cough, hemoptysis, shortness of breath and wheezing.   Cardiovascular: Negative.  Negative for chest pain, palpitations and leg swelling.  Gastrointestinal: Negative.  Negative for abdominal pain, blood in stool, diarrhea, nausea and vomiting.  Genitourinary: Negative.  Negative for dysuria.  Musculoskeletal: Negative.  Negative for back pain.  Skin: Negative.   Neurological: Positive for focal weakness (baseline). Negative for dizziness, tremors, speech change, seizures and headaches.  Endo/Heme/Allergies: Negative.  Does not bruise/bleed easily.  Psychiatric/Behavioral: Negative.  Negative for depression, hallucinations and suicidal ideas.     PHYSICAL EXAMINATION:  GENERAL:  62 y.o.-year-old patient lying in the bed with no acute distress.  NECK:  Supple, no jugular venous distention. No thyroid enlargement, no tenderness.  LUNGS: Normal breath sounds bilaterally, no wheezing, rales,rhonchi  No use of accessory muscles of respiration.  CARDIOVASCULAR: S1, S2 normal. No murmurs, rubs, or gallops.  ABDOMEN: Soft, non-tender, non-distended. Bowel sounds present. No organomegaly or mass.  EXTREMITIES: No pedal edema, cyanosis, or clubbing.  PSYCHIATRIC: The patient is alert and oriented x 3.  SKIN: No obvious rash, lesion, or ulcer.  Neuro: Patient has chronic left upper extremity weakness and facial droop from previous stroke DATA REVIEW:   CBC Recent Labs  Lab 03/31/18 1252  WBC 6.4  HGB 11.2*  HCT 33.1*  PLT 220    Chemistries  Recent Labs  Lab 03/31/18 1252  NA 141  K 3.9  CL 106  CO2 26  GLUCOSE 183*  BUN 27*  CREATININE 1.12*  CALCIUM 8.9  AST 21  ALT 19  ALKPHOS 73  BILITOT  0.6    Cardiac Enzymes Recent Labs  Lab 03/31/18 1252  [place] <0.03    Microbiology Results  _1 @  RADIOLOGY:  Ct Angio Head W Or Wo Contrast  Result Date: 04/01/2018 CLINICAL DATA:  Left-sided weakness.  Stroke workup. EXAM: CT ANGIOGRAPHY HEAD  AND NECK TECHNIQUE: Multidetector CT imaging of the head and neck was performed using the standard protocol during bolus administration of intravenous contrast. Multiplanar CT image reconstructions and MIPs were obtained to evaluate the vascular anatomy. Carotid stenosis measurements (when applicable) are obtained utilizing NASCET criteria, using the distal internal carotid diameter as the denominator. CONTRAST:  32m OMNIPAQUE IOHEXOL 350 MG/ML SOLN COMPARISON:  Head CT from yesterday FINDINGS: CT HEAD FINDINGS Brain: Remote left lateral lenticulostriate infarct affecting left basal ganglia and regional white matter. Atrophy, most notably of the cerebellum. Vascular: See below. Skull: No acute finding Sinuses: Negative Orbits: Probable remote blowout fracture on the right. No acute finding. Review of the MIP images confirms the above findings CTA NECK FINDINGS Aortic arch: Atherosclerotic plaque.  Three vessel branching. Right carotid system: Calcified plaque at the common carotid bifurcation and ICA bulb. ICA origin stenosis measures 60% on sagittal reformats. No beading or dissection seen. Left carotid system: Prominent calcified plaque at the common carotid bifurcation. Proximal ICA stenosis measures 45% on coronal reformats. No downstream beading or dissection. Vertebral arteries: Bilateral proximal subclavian calcified plaque. On the right this stenosis measures up to 50% when measured on coronal reformats. Both vertebral arteries are smooth and widely patent to the dura. Skeleton: Remote nonunited right clavicle fracture. Cervical disc degeneration. Osteopenia Other neck: No evidence of mass or adenopathy. Upper chest: Negative Review of  the MIP images confirms the above findings CTA HEAD FINDINGS Anterior circulation: Confluent calcified plaque on the carotid siphons. Plaque blooming limits quantification of stenosis; no focal stenosis is suspected. Cut off of right A2, which correlates with history of leg symptoms. MCA branches are symmetrically patent. Best seen on MIPS there is prominent, asymmetric collaterals extending over the right cerebral convexity. These can be compensatory in the acute or chronic setting. Posterior circulation: Confluent calcified plaque on the V4 segments with mild generalized luminal narrowing. The vertebral and basilar arteries are diffusely smooth. Asymmetrically small right PCA without evidence of focal inter lying stenosis. Question if there is duplicated right PCA from the posterior communicating artery. Venous sinuses: Patent Anatomic variants: As above Delayed phase: No abnormal intracranial enhancement Review of the MIP images confirms the above findings IMPRESSION: 1. Right A2 cut off/occlusion with prominent collaterals extending over the right cerebral convexity. This correlates well with history of left leg weakness. No detected infarct by CT. 2. Carotid bifurcation atherosclerosis leading to 60% proximal ICA stenosis on the left and 45% on the right. 3. 50% narrowing in the proximal right subclavian. Electronically Signed   By: JMonte FantasiaM.D.   On: 04/01/2018 13:26   Ct Head Wo Contrast  Result Date: 03/31/2018 CLINICAL DATA:  Focal neuro deficit greater than 6 hours, stroke suspected. Weakness and LEFT-sided facial droop. EXAM: CT HEAD WITHOUT CONTRAST TECHNIQUE: Contiguous axial images were obtained from the base of the skull through the vertex without intravenous contrast. COMPARISON:  Head CT dated 10/03/2017.  Brain MRI dated 10/04/2017. FINDINGS: Brain: Ventricles are stable in size and configuration. Chronic ischemic changes again noted within the periventricular white matter regions  bilaterally and LEFT basal ganglia with associated encephalomalacia. There is no mass, hemorrhage, edema or other evidence of acute parenchymal abnormality. No extra-axial hemorrhage. Vascular: Chronic calcified atherosclerotic changes of the large vessels at the skull base. No unexpected hyperdense vessel. Skull: Normal. Negative for fracture or focal lesion. Sinuses/Orbits: No acute finding. Other: None. IMPRESSION: 1. No acute findings.  No intracranial mass, hemorrhage or edema. 2. Chronic ischemic changes  within the white matter and LEFT basal ganglia. Electronically Signed   By: Franki Cabot M.D.   On: 03/31/2018 13:35   Ct Angio Neck W Or Wo Contrast  Result Date: 04/01/2018 CLINICAL DATA:  Left-sided weakness.  Stroke workup. EXAM: CT ANGIOGRAPHY HEAD AND NECK TECHNIQUE: Multidetector CT imaging of the head and neck was performed using the standard protocol during bolus administration of intravenous contrast. Multiplanar CT image reconstructions and MIPs were obtained to evaluate the vascular anatomy. Carotid stenosis measurements (when applicable) are obtained utilizing NASCET criteria, using the distal internal carotid diameter as the denominator. CONTRAST:  44m OMNIPAQUE IOHEXOL 350 MG/ML SOLN COMPARISON:  Head CT from yesterday FINDINGS: CT HEAD FINDINGS Brain: Remote left lateral lenticulostriate infarct affecting left basal ganglia and regional white matter. Atrophy, most notably of the cerebellum. Vascular: See below. Skull: No acute finding Sinuses: Negative Orbits: Probable remote blowout fracture on the right. No acute finding. Review of the MIP images confirms the above findings CTA NECK FINDINGS Aortic arch: Atherosclerotic plaque.  Three vessel branching. Right carotid system: Calcified plaque at the common carotid bifurcation and ICA bulb. ICA origin stenosis measures 60% on sagittal reformats. No beading or dissection seen. Left carotid system: Prominent calcified plaque at the common  carotid bifurcation. Proximal ICA stenosis measures 45% on coronal reformats. No downstream beading or dissection. Vertebral arteries: Bilateral proximal subclavian calcified plaque. On the right this stenosis measures up to 50% when measured on coronal reformats. Both vertebral arteries are smooth and widely patent to the dura. Skeleton: Remote nonunited right clavicle fracture. Cervical disc degeneration. Osteopenia Other neck: No evidence of mass or adenopathy. Upper chest: Negative Review of the MIP images confirms the above findings CTA HEAD FINDINGS Anterior circulation: Confluent calcified plaque on the carotid siphons. Plaque blooming limits quantification of stenosis; no focal stenosis is suspected. Cut off of right A2, which correlates with history of leg symptoms. MCA branches are symmetrically patent. Best seen on MIPS there is prominent, asymmetric collaterals extending over the right cerebral convexity. These can be compensatory in the acute or chronic setting. Posterior circulation: Confluent calcified plaque on the V4 segments with mild generalized luminal narrowing. The vertebral and basilar arteries are diffusely smooth. Asymmetrically small right PCA without evidence of focal inter lying stenosis. Question if there is duplicated right PCA from the posterior communicating artery. Venous sinuses: Patent Anatomic variants: As above Delayed phase: No abnormal intracranial enhancement Review of the MIP images confirms the above findings IMPRESSION: 1. Right A2 cut off/occlusion with prominent collaterals extending over the right cerebral convexity. This correlates well with history of left leg weakness. No detected infarct by CT. 2. Carotid bifurcation atherosclerosis leading to 60% proximal ICA stenosis on the left and 45% on the right. 3. 50% narrowing in the proximal right subclavian. Electronically Signed   By: JMonte FantasiaM.D.   On: 04/01/2018 13:26   Mr Brain Wo Contrast  Result Date:  04/01/2018 CLINICAL DATA:  Stroke follow-up. Slurred speech and left arm and leg weakness. EXAM: MRI HEAD WITHOUT CONTRAST TECHNIQUE: Multiplanar, multiecho pulse sequences of the brain and surrounding structures were obtained without intravenous contrast. COMPARISON:  Head CT/CTA 04/01/2018 Brain MRI 10/04/2017 FINDINGS: BRAIN: There is no acute infarct, acute hemorrhage or mass effect. The midline structures are normal. Old left frontal lobe infarct and multiple old gangliothalamic lacunar infarct. There is ex vacuo dilatation of the left lateral ventricle. Aside from the sites of prior infarcts, minimal periventricular white matter hyperintensity. Atrophy is greater  than expected for age. Susceptibility-sensitive sequences show no chronic microhemorrhage or superficial siderosis. VASCULAR: Major intracranial arterial and venous sinus flow voids are preserved. SKULL AND UPPER CERVICAL SPINE: The visualized skull base, calvarium, upper cervical spine and extracranial soft tissues are normal. SINUSES/ORBITS: No fluid levels or advanced mucosal thickening. No mastoid or middle ear effusion. The orbits are normal. IMPRESSION: 1. No acute intracranial abnormality. 2. 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 Ex vacuo dilatation of the left lateral ventricle and generalized mildly age advanced atrophy. Electronically Signed   By: Ulyses Jarred M.D.   On: 04/01/2018 13:37   US Carotid Bilateral (at [facility] Only)  Result Date: 04/01/2018 CLINICAL DATA:  Left-sided weakness and slurred speech. History of hypertension, hyperlipidemia and diabetes. EXAM: BILATERAL CAROTID DUPLEX ULTRASOUND TECHNIQUE: Pearline Cables scale imaging, color Doppler and duplex ultrasound were performed of bilateral carotid and vertebral arteries in the neck. COMPARISON:  10/04/2017 FINDINGS: Criteria: Quantification of carotid stenosis is based on velocity parameters that correlate the residual internal carotid diameter with  NASCET-based stenosis levels, using the diameter of the distal internal carotid lumen as the denominator for stenosis measurement. The following velocity measurements were obtained: RIGHT ICA:  196/37 cm/sec CCA:  47/65 cm/sec SYSTOLIC ICA/CCA RATIO:  3.1 ECA:  197 cm/sec LEFT ICA:  75/17 cm/sec CCA:  46/50 cm/sec SYSTOLIC ICA/CCA RATIO:  1.3 ECA:  153 cm/sec RIGHT CAROTID ARTERY: Similar and stable appearance of moderate amount of calcified plaque at the level of the distal common carotid artery, carotid bulb and extending into the proximal internal and external carotid arteries. Turbulent flow present in the proximal right ICA with stable velocities corresponding to an estimated 50-69% stenosis. Velocities and systolic ratio almost meet criteria for a greater than 70% stenosis. RIGHT VERTEBRAL ARTERY: Antegrade flow with normal waveform and velocity. LEFT CAROTID ARTERY: Stable moderate calcified plaque at the level of the carotid bulb and proximal left ICA. Velocities are stable and estimated left ICA stenosis remains less than 50%. LEFT VERTEBRAL ARTERY: Antegrade flow with normal waveform and velocity. IMPRESSION: 1. Stable appearance of moderate plaque at the right carotid bifurcation with estimated 50-69% proximal right ICA stenosis. Velocity and velocity ratio almost meet criteria for a greater than 70% stenosis. 2. Stable appearance of plaque at the left carotid bifurcation with estimated less than 50% proximal left ICA stenosis. Electronically Signed   By: Aletta Edouard M.D.   On: 04/01/2018 09:33   Ct Cerebral Perfusion W Contrast  Result Date: 03/31/2018 CLINICAL DATA:  Awoke today with balance disturbance and weakness. Left facial droop. EXAM: CT PERFUSION BRAIN TECHNIQUE: Multiphase CT imaging of the brain was performed following IV bolus contrast injection. Subsequent parametric perfusion maps were calculated using RAPID software. CONTRAST:  84m ISOVUE-370 IOPAMIDOL (ISOVUE-370) INJECTION 76%  COMPARISON:  CT same day. Multiple previous intracranial MR angiography exams as distant as 2010. FINDINGS: CT Brain Perfusion Findings: CBF (<30%) Volume: 074mPerfusion (Tmax>6.0s) volume: 8m84mThe study does suggest a region of T-max greater than 4 seconds possibly corresponding with the distal anterior cerebral artery territory on the right. Mismatch Volume: 8mL49mPECTS on noncontrast CT Head: 10 at 1312 hours today. Infarct Core: 0 mL Infarction Location:None IMPRESSION: No finding to suggest completed infarction or T-max greater than 6 seconds. T-max greater than 4 seconds in the right anterior cerebral artery territory could represent right anterior cerebral insult. However, in reviewing an MR angiogram from March of this year, 2017 and as distant as 2010, there appears to  be an abnormal appearance of the right anterior cerebral artery at that time and this could be due to chronic stenosis or occlusion of that vessel. If concern persists, consider CT angiography. Electronically Signed   By: Nelson Chimes M.D.   On: 03/31/2018 15:48      Allergies as of 04/02/2018      Reactions   Morphine And Related Anaphylaxis   Fentanyl Other (See Comments)   Unknown reaction.   Reglan [metoclopramide]    Other reaction(s): Unknown Elevated BP   Simvastatin Other (See Comments)   Muscle pain   Betadine [povidone Iodine] Rash   Iodine Rash   Other reaction(s): Unknown-not allergic to ct contrast-ars 04/01/18   Tetracyclines & Related Rash   Other reaction(s): Unknown      Medication List    STOP taking these medications   cephALEXin 500 MG capsule Commonly known as:  KEFLEX     TAKE these medications   ACCU-CHEK AVIVA PLUS w/Device Kit Check sugar three times daily DX E11.9-needs a meter   accu-chek softclix lancets Check sugar three times daily DX E11.9   ACCU-CHEK SOFTCLIX LANCETS lancets CHECK BLOOD SUGAR THREE TIMES DAILY   amLODipine 10 MG tablet Commonly known as:  NORVASC Take 1  tablet (10 mg total) by mouth daily.   atorvastatin 20 MG tablet Commonly known as:  LIPITOR Take 20 mg by mouth daily.   cloNIDine 0.3 MG tablet Commonly known as:  CATAPRES Take 1 tablet (0.3 mg total) by mouth 2 (two) times daily.   clopidogrel 75 MG tablet Commonly known as:  PLAVIX Take 1 tablet (75 mg total) by mouth daily.   cyclobenzaprine 5 MG tablet Commonly known as:  FLEXERIL Take 1 tablet (5 mg total) by mouth 3 (three) times daily as needed for muscle spasms.   diphenoxylate-atropine 2.5-0.025 MG tablet Commonly known as:  LOMOTIL 2 tablets initially and then 1 tablet every 6 hours as needed for diarrhea   enalapril 20 MG tablet Commonly known as:  VASOTEC Take 1 tablet (20 mg total) by mouth 2 (two) times daily.   glucose blood test strip TEST BLOOD SUGAR THREE TIMES DAILY   hydrALAZINE 100 MG tablet Commonly known as:  APRESOLINE Take 1 tablet (100 mg total) by mouth 2 (two) times daily.   HYDROcodone-acetaminophen 10-325 MG tablet Commonly known as:  NORCO Take 1-2 tablets by mouth every 6 (six) hours as needed.   insulin aspart protamine- aspart (70-30) 100 UNIT/ML injection Commonly known as:  NOVOLOG MIX 70/30 Inject 10-35 Units into the skin 2 (two) times daily.   levETIRAcetam 500 MG tablet Commonly known as:  KEPPRA Take 1 tablet (500 mg total) by mouth 2 (two) times daily. 500 mg bid.   meclizine 25 MG tablet Commonly known as:  ANTIVERT Take 1 tablet (25 mg total) by mouth 3 (three) times daily as needed for dizziness.   metFORMIN 1000 MG tablet Commonly known as:  GLUCOPHAGE Take 1 tablet (1,000 mg total) by mouth 2 (two) times daily.   metoprolol succinate 100 MG 24 hr tablet Commonly known as:  TOPROL-XL Take 1 tablet (100 mg total) by mouth daily.   nitroGLYCERIN 0.4 MG SL tablet Commonly known as:  NITROSTAT Place 1 tablet (0.4 mg total) under the tongue every 5 (five) minutes as needed for chest pain.   nystatin  cream Commonly known as:  MYCOSTATIN Apply 1 application topically 2 (two) times daily.   oxybutynin 5 MG tablet Commonly known as:  DITROPAN Take 1 tablet (5 mg total) by mouth 2 (two) times daily.   pantoprazole 40 MG tablet Commonly known as:  PROTONIX Take 1 tablet (40 mg total) by mouth daily.   ranitidine 150 MG capsule Commonly known as:  ZANTAC Take 1 capsule (150 mg total) by mouth every evening.   triamcinolone cream 0.1 % Commonly known as:  KENALOG Can use thin layer on affected areas 2x daily for one week at a time. Not for face.            Durable Medical Equipment  (From admission, onward)         Start     Ordered   04/02/18 0823  For home use only DME Gilford Rile  [facility])  Once    Question:  Patient needs a walker to treat with the following condition  Answer:  TIA (transient ischemic attack)   04/02/18 0823             Management plans discussed with the patient and she is in agreement. Stable for discharge  Patient should follow up with pcp  CODE STATUS:     Code Status Orders  (From admission, onward)         Start     Ordered   03/31/18 2006  Do not attempt resuscitation (DNR)  Continuous    Question Answer Comment  In the event of cardiac or respiratory ARREST Do not call a "code blue"   In the event of cardiac or respiratory ARREST Do not perform Intubation, CPR, defibrillation or ACLS   In the event of cardiac or respiratory ARREST Use medication by any route, position, wound care, and other measures to relive pain and suffering. May use oxygen, suction and manual treatment of airway obstruction as needed for comfort.      03/31/18 2006        Code Status History    Date Active Date Inactive Code Status Order ID Comments User Context   10/03/2017 2208 10/04/2017 1718 Full Code [id number]  Dustin Flock, MD Inpatient   10/15/2016 2201 10/17/2016 1624 Full Code [id number]  Vaughan Basta, MD ED   07/08/2016 0437 07/11/2016 1610  Full Code [id number]  Saundra Shelling, MD ED   12/26/2015 1847 12/28/2015 2027 Full Code [id number]  Idelle Crouch, MD Inpatient   07/22/2015 0735 07/23/2015 1505 Full Code [id number]  Saundra Shelling, MD Inpatient    Advance Directive Documentation     Most Recent Value  Type of Advance Directive  Healthcare Power of [place], Living will  Pre-existing out of facility DNR order (yellow form or pink MOST form)  -  "MOST" Form in Place?  -      TOTAL TIME TAKING CARE OF THIS PATIENT: 39 minutes.    Note: This dictation was prepared with Dragon dictation along with smaller phrase technology. Any transcriptional errors that result from this process are unintentional.  Marlys Stegmaier M.D on 04/02/2018 at 9:52 AM  Between 7am to 6pm - Pager - [phone] After 6pm go to [email or web address] - password [email or web address] [facility]  Office  [phone]  CC: Primary care physician; Jerrol Banana., MD

## 2018-04-02 NOTE — Progress Notes (Signed)
Pt discharged home at this time no c/o. Out via w/c with husband. Instructions discussed with pt. meds / diet activity and f/u given . Verbalizes understanding. Sl d/cd earlier.

## 2018-04-03 ENCOUNTER — Emergency Department
Admission: EM | Admit: 2018-04-03 | Discharge: 2018-04-03 | Disposition: A | Discharge status: Home / Self Care | Payer: PPO | Source: Home / Self Care | Attending: Emergency Medicine | Admitting: Emergency Medicine

## 2018-04-03 DIAGNOSIS — G43809 Other migraine, not intractable, without status migrainosus: Secondary | ICD-10-CM

## 2018-04-03 MED ORDER — PROCHLORPERAZINE EDISYLATE 10 MG/2ML IJ SOLN
INTRAMUSCULAR | Status: AC
  Filled 2018-04-03: qty 2

## 2018-04-03 MED ORDER — DIPHENHYDRAMINE HCL 50 MG/ML IJ SOLN
12.5000 mg | Freq: Once | INTRAMUSCULAR | Status: AC
  Administered 2018-04-03: 12.5 mg via INTRAVENOUS

## 2018-04-03 MED ORDER — KETOROLAC TROMETHAMINE 30 MG/ML IJ SOLN
INTRAMUSCULAR | Status: AC
  Filled 2018-04-03: qty 1

## 2018-04-03 MED ORDER — PROCHLORPERAZINE MALEATE 5 MG PO TABS: 5.0000 mg | ORAL_TABLET | Freq: Four times a day (QID) | ORAL | 0 refills | Status: DC | PRN

## 2018-04-03 MED ORDER — PROCHLORPERAZINE EDISYLATE 10 MG/2ML IJ SOLN
2.5000 mg | Freq: Once | INTRAMUSCULAR | Status: AC
  Administered 2018-04-03: 2.5 mg via INTRAVENOUS

## 2018-04-03 MED ORDER — KETOROLAC TROMETHAMINE 30 MG/ML IJ SOLN
10.0000 mg | Freq: Once | INTRAMUSCULAR | Status: AC
  Administered 2018-04-03: 9.9 mg via INTRAVENOUS

## 2018-04-03 MED ORDER — DIPHENHYDRAMINE HCL 50 MG/ML IJ SOLN
INTRAMUSCULAR | Status: AC
  Filled 2018-04-03: qty 1
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
## 2018-04-03 NOTE — Discharge Instructions (Addendum)
1.  You may take Compazine as needed for headache and nausea. 2.  Return to the ER for worsening symptoms, persistent vomiting, lethargy or other concerns.

## 2018-04-03 NOTE — Telephone Encounter (Signed)
Thank you, pt is currently in the ER. Will await d/c before calling.  -MM

## 2018-04-04 ENCOUNTER — Telehealth: Payer: Self-pay | Admitting: *Deleted

## 2018-04-04 DIAGNOSIS — K219 Gastro-esophageal reflux disease without esophagitis: Secondary | ICD-10-CM | POA: Diagnosis not present

## 2018-04-04 DIAGNOSIS — R3915 Urgency of urination: Secondary | ICD-10-CM | POA: Diagnosis not present

## 2018-04-04 DIAGNOSIS — I69354 Hemiplegia and hemiparesis following cerebral infarction affecting left non-dominant side: Secondary | ICD-10-CM | POA: Diagnosis not present

## 2018-04-04 DIAGNOSIS — E785 Hyperlipidemia, unspecified: Secondary | ICD-10-CM | POA: Diagnosis not present

## 2018-04-04 DIAGNOSIS — I69398 Other sequelae of cerebral infarction: Secondary | ICD-10-CM | POA: Diagnosis not present

## 2018-04-04 DIAGNOSIS — I69392 Facial weakness following cerebral infarction: Secondary | ICD-10-CM | POA: Diagnosis not present

## 2018-04-04 DIAGNOSIS — R569 Unspecified convulsions: Secondary | ICD-10-CM | POA: Diagnosis not present

## 2018-04-04 DIAGNOSIS — H53462 Homonymous bilateral field defects, left side: Secondary | ICD-10-CM | POA: Diagnosis not present

## 2018-04-04 DIAGNOSIS — F329 Major depressive disorder, single episode, unspecified: Secondary | ICD-10-CM | POA: Diagnosis not present

## 2018-04-04 DIAGNOSIS — K589 Irritable bowel syndrome without diarrhea: Secondary | ICD-10-CM | POA: Diagnosis not present

## 2018-04-04 DIAGNOSIS — Z794 Long term (current) use of insulin: Secondary | ICD-10-CM | POA: Diagnosis not present

## 2018-04-04 DIAGNOSIS — E1165 Type 2 diabetes mellitus with hyperglycemia: Secondary | ICD-10-CM | POA: Diagnosis not present

## 2018-04-04 DIAGNOSIS — I6523 Occlusion and stenosis of bilateral carotid arteries: Secondary | ICD-10-CM | POA: Diagnosis not present

## 2018-04-04 DIAGNOSIS — Z79891 Long term (current) use of opiate analgesic: Secondary | ICD-10-CM | POA: Diagnosis not present

## 2018-04-04 DIAGNOSIS — Z6841 Body Mass Index (BMI) 40.0 and over, adult: Secondary | ICD-10-CM | POA: Diagnosis not present

## 2018-04-04 DIAGNOSIS — M4320 Fusion of spine, site unspecified: Secondary | ICD-10-CM | POA: Diagnosis not present

## 2018-04-04 DIAGNOSIS — I69322 Dysarthria following cerebral infarction: Secondary | ICD-10-CM | POA: Diagnosis not present

## 2018-04-04 DIAGNOSIS — Z981 Arthrodesis status: Secondary | ICD-10-CM | POA: Diagnosis not present

## 2018-04-04 DIAGNOSIS — Z7902 Long term (current) use of antithrombotics/antiplatelets: Secondary | ICD-10-CM | POA: Diagnosis not present

## 2018-04-04 DIAGNOSIS — I69393 Ataxia following cerebral infarction: Secondary | ICD-10-CM | POA: Diagnosis not present

## 2018-04-04 DIAGNOSIS — I10 Essential (primary) hypertension: Secondary | ICD-10-CM | POA: Diagnosis not present

## 2018-04-04 NOTE — Telephone Encounter (Signed)
Please review. Thanks!  

## 2018-04-04 NOTE — Telephone Encounter (Signed)
Patient called office requesting an rx for Toradol. Patient states she has a migraine. She states she has been given Toradol in the past and this really helped her. Please advise?

## 2018-04-05 ENCOUNTER — Telehealth: Payer: Self-pay | Admitting: Family Medicine

## 2018-04-05 NOTE — Telephone Encounter (Signed)
Please review. Thanks!  

## 2018-04-05 NOTE — Telephone Encounter (Signed)
Susan House with [facility] is requesting verbal orders for in home physical therapy as follows:  Once a week for 1 week Twice a week for 2 weeks Once a week for 1 week.  Please advise. Thanks TNP

## 2018-04-05 NOTE — Telephone Encounter (Signed)
Adding to note below: Raquel Sarna also wanted to let Dr. Rosanna Randy know pt's heart rate was 58 today. Please advise. Thanks TNP

## 2018-04-05 NOTE — Telephone Encounter (Signed)
Susan House is needing approval for plan of care for nursing  Services for pt.  1 time a week for 1 week 2 times a week for 1 week 1 time a week for 1 week 1 every other week for 1 week  Please advise.  Thanks [employer]

## 2018-04-08 NOTE — Telephone Encounter (Signed)
Tried to contact pt and family and requested a CB. No CB was made. FYI to PCP. -MM

## 2018-04-08 NOTE — Telephone Encounter (Signed)
OK for orders? And any comment on the heart rate?

## 2018-04-08 NOTE — Telephone Encounter (Signed)
Yes for orders--heart rate ok at 58.

## 2018-04-08 NOTE — Telephone Encounter (Signed)
ok 

## 2018-04-08 NOTE — Telephone Encounter (Signed)
Con Memos, I have forwarded this to Dr Rosanna Randy,  I hope you let her know that he was not here Friday and therefore we did not get the message to send to him until this morning.  I will call her as soon as he lets me know somoething OK Thanks ED

## 2018-04-08 NOTE — Telephone Encounter (Signed)
Advised  ED 

## 2018-04-08 NOTE — Telephone Encounter (Signed)
Amy with [facility] called this morning saying she has not heard back for the verbal request for nursing care.  Call [phone]  Thanks Con Memos

## 2018-04-09 ENCOUNTER — Ambulatory Visit (INDEPENDENT_AMBULATORY_CARE_PROVIDER_SITE_OTHER): Payer: PPO | Admitting: Family Medicine

## 2018-04-09 ENCOUNTER — Encounter: Payer: Self-pay | Admitting: Family Medicine

## 2018-04-09 ENCOUNTER — Telehealth: Payer: Self-pay | Admitting: Family Medicine

## 2018-04-09 VITALS — BP 122/60 | HR 70 | Temp 98.7°F | Resp 16

## 2018-04-09 DIAGNOSIS — M2669 Other specified disorders of temporomandibular joint: Secondary | ICD-10-CM

## 2018-04-09 DIAGNOSIS — L309 Dermatitis, unspecified: Secondary | ICD-10-CM

## 2018-04-09 MED ORDER — TRIAMCINOLONE ACETONIDE 0.1 % EX CREA: TOPICAL_CREAM | CUTANEOUS | 0 refills | Status: DC

## 2018-04-09 NOTE — Telephone Encounter (Signed)
Advised Susan House as below.  

## 2018-04-09 NOTE — Telephone Encounter (Signed)
Needing verbal orders for OT  2 times a week for 2 weeks  1 time a week for 1 week  The number has a confidential voicemail.  A voicemail can be left.

## 2018-04-09 NOTE — Telephone Encounter (Signed)
Amy with [facility] called this morning again saying she has not heard back for the verbal request for nursing care. She is afraid pt will be missing appointments if not approved soon. Please call Amy back at:  [phone]  Thanks, [facility]

## 2018-04-09 NOTE — Progress Notes (Signed)
Patient: Susan House Female    DOB: 1956/04/04   62 y.o.   MRN: [id number] Visit Date: 04/09/2018  Today's Provider: Wilhemena Durie, MD   Chief Complaint  Patient presents with  . Ear Pain  . Rash   Subjective:    HPI Patient comes in today c/o pain in her left ear. She reports that she has had symptoms X 3 days. She reports that her left ear is tender to touch on the outside.  She also reports that she has a rash on the left side of her neck that is red and tichy X 3 days.   Patient also mentions that she has swelling in her right ankle that has not gotten any better.     Allergies  Allergen Reactions  . Morphine And Related Anaphylaxis  . Fentanyl Other (See Comments)    Unknown reaction.  . Reglan [Metoclopramide]     Other reaction(s): Unknown Elevated BP  . Simvastatin Other (See Comments)    Muscle pain  . Betadine [Povidone Iodine] Rash  . Iodine Rash    Other reaction(s): Unknown-not allergic to ct contrast-ars 04/01/18  . Tetracyclines & Related Rash    Other reaction(s): Unknown     Current Outpatient Medications:  .  ACCU-CHEK SOFTCLIX LANCETS lancets, CHECK BLOOD SUGAR THREE TIMES DAILY, Disp: 300 each, Rfl: 3 .  amLODipine (NORVASC) 10 MG tablet, Take 1 tablet (10 mg total) by mouth daily., Disp: 90 tablet, Rfl: 3 .  atorvastatin (LIPITOR) 20 MG tablet, Take 20 mg by mouth daily., Disp: , Rfl:  .  Blood Glucose Monitoring Suppl (ACCU-CHEK AVIVA PLUS) w/Device KIT, Check sugar three times daily DX E11.9-needs a meter, Disp: 1 kit, Rfl: 0 .  cloNIDine (CATAPRES) 0.3 MG tablet, Take 1 tablet (0.3 mg total) by mouth 2 (two) times daily., Disp: 60 tablet, Rfl: 12 .  clopidogrel (PLAVIX) 75 MG tablet, Take 1 tablet (75 mg total) by mouth daily., Disp: 90 tablet, Rfl: 3 .  diphenoxylate-atropine (LOMOTIL) 2.5-0.025 MG tablet, 2 tablets initially and then 1 tablet every 6 hours as needed for diarrhea, Disp: 60 tablet, Rfl: 1 .  enalapril  (VASOTEC) 20 MG tablet, Take 1 tablet (20 mg total) by mouth 2 (two) times daily., Disp: 180 tablet, Rfl: 2 .  glucose blood (ACCU-CHEK AVIVA PLUS) test strip, TEST BLOOD SUGAR THREE TIMES DAILY, Disp: 300 each, Rfl: 3 .  hydrALAZINE (APRESOLINE) 100 MG tablet, Take 1 tablet (100 mg total) by mouth 2 (two) times daily., Disp: 60 tablet, Rfl: 11 .  HYDROcodone-acetaminophen (NORCO) 10-325 MG tablet, Take 1-2 tablets by mouth every 6 (six) hours as needed., Disp: 100 tablet, Rfl: 0 .  insulin aspart protamine- aspart (NOVOLOG MIX 70/30) (70-30) 100 UNIT/ML injection, Inject 10-35 Units into the skin 2 (two) times daily. , Disp: , Rfl:  .  Lancet Devices (ACCU-CHEK SOFTCLIX) lancets, Check sugar three times daily DX E11.9, Disp: 100 each, Rfl: 3 .  levETIRAcetam (KEPPRA) 500 MG tablet, Take 1 tablet (500 mg total) by mouth 2 (two) times daily. 500 mg bid., Disp: 180 tablet, Rfl: 3 .  metFORMIN (GLUCOPHAGE) 1000 MG tablet, Take 1 tablet (1,000 mg total) by mouth 2 (two) times daily., Disp: 180 tablet, Rfl: 3 .  metoprolol succinate (TOPROL-XL) 100 MG 24 hr tablet, Take 1 tablet (100 mg total) by mouth daily., Disp: 90 tablet, Rfl: 3 .  nitroGLYCERIN (NITROSTAT) 0.4 MG SL tablet, Place 1 tablet (0.4 mg  total) under the tongue every 5 (five) minutes as needed for chest pain., Disp: 25 tablet, Rfl: 0 .  nystatin cream (MYCOSTATIN), Apply 1 application topically 2 (two) times daily., Disp: 30 g, Rfl: 0 .  oxybutynin (DITROPAN) 5 MG tablet, Take 1 tablet (5 mg total) by mouth 2 (two) times daily., Disp: 180 tablet, Rfl: 3 .  pantoprazole (PROTONIX) 40 MG tablet, Take 1 tablet (40 mg total) by mouth daily., Disp: 90 tablet, Rfl: 3 .  prochlorperazine (COMPAZINE) 5 MG tablet, Take 1 tablet (5 mg total) by mouth every 6 (six) hours as needed for nausea or vomiting., Disp: 20 tablet, Rfl: 0 .  ranitidine (ZANTAC) 150 MG capsule, Take 1 capsule (150 mg total) by mouth every evening., Disp: 30 capsule, Rfl: 12 .   triamcinolone cream (KENALOG) 0.1 %, Can use thin layer on affected areas 2x daily for one week at a time. Not for face., Disp: 30 g, Rfl: 0 .  cyclobenzaprine (FLEXERIL) 5 MG tablet, Take 1 tablet (5 mg total) by mouth 3 (three) times daily as needed for muscle spasms. (Patient not taking: Reported on 03/31/2018), Disp: 30 tablet, Rfl: 1 .  meclizine (ANTIVERT) 25 MG tablet, Take 1 tablet (25 mg total) by mouth 3 (three) times daily as needed for dizziness. (Patient not taking: Reported on 03/31/2018), Disp: 30 tablet, Rfl: 0  Review of Systems  Constitutional: Positive for fatigue. Negative for activity change, appetite change, chills, diaphoresis, fever and unexpected weight change.  HENT: Positive for congestion, ear pain and postnasal drip. Negative for ear discharge, sinus pressure, sinus pain, sneezing, sore throat, tinnitus and voice change.   Eyes: Negative.   Respiratory: Negative for cough and shortness of breath.   Cardiovascular: Negative for chest pain and leg swelling.  Endocrine: Negative.   Musculoskeletal: Positive for arthralgias. Negative for back pain.  Allergic/Immunologic: Negative.   Neurological: Negative for dizziness, light-headedness and headaches.    Social History   Tobacco Use  . Smoking status: Never Smoker  . Smokeless tobacco: Never Used  Substance Use Topics  . Alcohol use: No   Objective:   BP 122/60 (BP Location: Right Arm, Patient Position: Sitting, Cuff Size: Normal)   Pulse 70   Temp 98.7 F (37.1 C)   Resp 16  Vitals:   04/09/18 0856  BP: 122/60  Pulse: 70  Resp: 16  Temp: 98.7 F (37.1 C)     Physical Exam  Constitutional: She is oriented to person, place, and time. She appears well-developed and well-nourished.  HENT:  Head: Normocephalic and atraumatic.  Eyes: Conjunctivae are normal. No scleral icterus.  Neck: No thyromegaly present.  Cardiovascular: Normal rate, regular rhythm and normal heart sounds.  Pulmonary/Chest: Effort  normal and breath sounds normal.  Abdominal: Soft.  Musculoskeletal: She exhibits no edema.  Neurological: She is alert and oriented to person, place, and time.  Skin: Skin is warm and dry.  Psychiatric: She has a normal mood and affect. Her behavior is normal. Thought content normal.        Assessment & Plan:     1. TMJ inflammation Hot compresses/tylenol.May need muscle relaxers.  2. Eczema, unspecified type Left ear. - triamcinolone cream (KENALOG) 0.1 %; Can use thin layer on affected areas 2x daily for one week at a time. Not for face.  Dispense: 30 g; Refill: 0 3.Rt Ankle Sprain  I have done the exam and reviewed the chart and it is accurate to the best of my knowledge. Diplomatic Services operational officer  technology has been used and  any errors in dictation or transcription are unintentional. Miguel Aschoff M.D. [place], MD  [facility]

## 2018-04-10 NOTE — Telephone Encounter (Signed)
Tried calling number as below twice. Unable to leave a VM. States. "a call cannot be completed at this time..." Will try again later.

## 2018-04-10 NOTE — Telephone Encounter (Signed)
Advised Shireen as below.

## 2018-04-10 NOTE — Telephone Encounter (Signed)
ok 

## 2018-04-10 NOTE — Telephone Encounter (Signed)
Ok to order? Please advise. Thanks!  

## 2018-04-11 ENCOUNTER — Ambulatory Visit (INDEPENDENT_AMBULATORY_CARE_PROVIDER_SITE_OTHER): Payer: PPO | Admitting: Family Medicine

## 2018-04-11 ENCOUNTER — Encounter: Payer: Self-pay | Admitting: Family Medicine

## 2018-04-11 VITALS — BP 142/64 | HR 64 | Temp 97.5°F | Resp 16 | Wt 229.0 lb

## 2018-04-11 DIAGNOSIS — G43809 Other migraine, not intractable, without status migrainosus: Secondary | ICD-10-CM

## 2018-04-11 DIAGNOSIS — Z6841 Body Mass Index (BMI) 40.0 and over, adult: Secondary | ICD-10-CM

## 2018-04-11 DIAGNOSIS — R531 Weakness: Secondary | ICD-10-CM | POA: Diagnosis not present

## 2018-04-11 DIAGNOSIS — H60502 Unspecified acute noninfective otitis externa, left ear: Secondary | ICD-10-CM

## 2018-04-11 DIAGNOSIS — G459 Transient cerebral ischemic attack, unspecified: Secondary | ICD-10-CM | POA: Diagnosis not present

## 2018-04-11 DIAGNOSIS — Z794 Long term (current) use of insulin: Secondary | ICD-10-CM

## 2018-04-11 DIAGNOSIS — I63512 Cerebral infarction due to unspecified occlusion or stenosis of left middle cerebral artery: Secondary | ICD-10-CM | POA: Diagnosis not present

## 2018-04-11 DIAGNOSIS — E118 Type 2 diabetes mellitus with unspecified complications: Secondary | ICD-10-CM

## 2018-04-11 DIAGNOSIS — Z7409 Other reduced mobility: Secondary | ICD-10-CM | POA: Diagnosis not present

## 2018-04-11 MED ORDER — FREESTYLE LIBRE 14 DAY SENSOR MISC: 1.0000 | Freq: Every day | 0 refills | Status: DC

## 2018-04-11 MED ORDER — MUPIROCIN CALCIUM 2 % EX CREA: 1.0000 "application " | TOPICAL_CREAM | Freq: Every day | CUTANEOUS | 0 refills | Status: DC

## 2018-04-11 NOTE — Progress Notes (Signed)
Patient: Susan House Female    DOB: 04/28/1956   62 y.o.   MRN: [id number] Visit Date: 04/11/2018  Today's Provider: Wilhemena Durie, MD   Chief Complaint  Patient presents with  . Hospitalization Follow-up  . Ear Pain   Subjective:    HPI  Follow up Hospitalization  Patient was admitted to [facility] on 04/02/2018 and discharged on 04/03/2018. She was treated for migraine headaches. Possible TIA Treatment for this included Toradol injection and Compazine for nausea .  She reports good compliance with treatment. She reports this condition is Improved.  BS chronically very labile.    Allergies  Allergen Reactions  . Morphine And Related Anaphylaxis  . Fentanyl Other (See Comments)    Unknown reaction.  . Reglan [Metoclopramide]     Other reaction(s): Unknown Elevated BP  . Simvastatin Other (See Comments)    Muscle pain  . Betadine [Povidone Iodine] Rash  . Iodine Rash    Other reaction(s): Unknown-not allergic to ct contrast-ars 04/01/18  . Tetracyclines & Related Rash    Other reaction(s): Unknown     Current Outpatient Medications:  .  ACCU-CHEK SOFTCLIX LANCETS lancets, CHECK BLOOD SUGAR THREE TIMES DAILY, Disp: 300 each, Rfl: 3 .  amLODipine (NORVASC) 10 MG tablet, Take 1 tablet (10 mg total) by mouth daily., Disp: 90 tablet, Rfl: 3 .  atorvastatin (LIPITOR) 20 MG tablet, Take 20 mg by mouth daily., Disp: , Rfl:  .  Blood Glucose Monitoring Suppl (ACCU-CHEK AVIVA PLUS) w/Device KIT, Check sugar three times daily DX E11.9-needs a meter, Disp: 1 kit, Rfl: 0 .  cloNIDine (CATAPRES) 0.3 MG tablet, Take 1 tablet (0.3 mg total) by mouth 2 (two) times daily., Disp: 60 tablet, Rfl: 12 .  clopidogrel (PLAVIX) 75 MG tablet, Take 1 tablet (75 mg total) by mouth daily., Disp: 90 tablet, Rfl: 3 .  cyclobenzaprine (FLEXERIL) 5 MG tablet, Take 1 tablet (5 mg total) by mouth 3 (three) times daily as needed for muscle spasms. (Patient not taking: Reported on  03/31/2018), Disp: 30 tablet, Rfl: 1 .  diphenoxylate-atropine (LOMOTIL) 2.5-0.025 MG tablet, 2 tablets initially and then 1 tablet every 6 hours as needed for diarrhea, Disp: 60 tablet, Rfl: 1 .  enalapril (VASOTEC) 20 MG tablet, Take 1 tablet (20 mg total) by mouth 2 (two) times daily., Disp: 180 tablet, Rfl: 2 .  glucose blood (ACCU-CHEK AVIVA PLUS) test strip, TEST BLOOD SUGAR THREE TIMES DAILY, Disp: 300 each, Rfl: 3 .  hydrALAZINE (APRESOLINE) 100 MG tablet, Take 1 tablet (100 mg total) by mouth 2 (two) times daily., Disp: 60 tablet, Rfl: 11 .  HYDROcodone-acetaminophen (NORCO) 10-325 MG tablet, Take 1-2 tablets by mouth every 6 (six) hours as needed., Disp: 100 tablet, Rfl: 0 .  insulin aspart protamine- aspart (NOVOLOG MIX 70/30) (70-30) 100 UNIT/ML injection, Inject 10-35 Units into the skin 2 (two) times daily. , Disp: , Rfl:  .  Lancet Devices (ACCU-CHEK SOFTCLIX) lancets, Check sugar three times daily DX E11.9, Disp: 100 each, Rfl: 3 .  levETIRAcetam (KEPPRA) 500 MG tablet, Take 1 tablet (500 mg total) by mouth 2 (two) times daily. 500 mg bid., Disp: 180 tablet, Rfl: 3 .  meclizine (ANTIVERT) 25 MG tablet, Take 1 tablet (25 mg total) by mouth 3 (three) times daily as needed for dizziness. (Patient not taking: Reported on 03/31/2018), Disp: 30 tablet, Rfl: 0 .  metFORMIN (GLUCOPHAGE) 1000 MG tablet, Take 1 tablet (1,000 mg total) by mouth  2 (two) times daily., Disp: 180 tablet, Rfl: 3 .  metoprolol succinate (TOPROL-XL) 100 MG 24 hr tablet, Take 1 tablet (100 mg total) by mouth daily., Disp: 90 tablet, Rfl: 3 .  nitroGLYCERIN (NITROSTAT) 0.4 MG SL tablet, Place 1 tablet (0.4 mg total) under the tongue every 5 (five) minutes as needed for chest pain., Disp: 25 tablet, Rfl: 0 .  nystatin cream (MYCOSTATIN), Apply 1 application topically 2 (two) times daily., Disp: 30 g, Rfl: 0 .  oxybutynin (DITROPAN) 5 MG tablet, Take 1 tablet (5 mg total) by mouth 2 (two) times daily., Disp: 180 tablet, Rfl:  3 .  pantoprazole (PROTONIX) 40 MG tablet, Take 1 tablet (40 mg total) by mouth daily., Disp: 90 tablet, Rfl: 3 .  prochlorperazine (COMPAZINE) 5 MG tablet, Take 1 tablet (5 mg total) by mouth every 6 (six) hours as needed for nausea or vomiting., Disp: 20 tablet, Rfl: 0 .  ranitidine (ZANTAC) 150 MG capsule, Take 1 capsule (150 mg total) by mouth every evening., Disp: 30 capsule, Rfl: 12 .  triamcinolone cream (KENALOG) 0.1 %, Can use thin layer on affected areas 2x daily for one week at a time. Not for face., Disp: 30 g, Rfl: 0  Review of Systems  Constitutional: Negative.   HENT: Positive for ear pain.   Eyes: Negative.   Respiratory: Negative.   Cardiovascular: Negative.   Endocrine:       BS very labile.  Musculoskeletal: Negative.   Allergic/Immunologic: Negative.   Neurological: Negative.   Psychiatric/Behavioral: Negative.     Social History   Tobacco Use  . Smoking status: Never Smoker  . Smokeless tobacco: Never Used  Substance Use Topics  . Alcohol use: No   Objective:   BP (!) 142/64   Pulse 64   Temp (!) 97.5 F (36.4 C)   Resp 16   Wt 229 lb (103.9 kg)   SpO2 98%   BMI 41.88 kg/m  Vitals:   04/11/18 1503  BP: (!) 142/64  Pulse: 64  Resp: 16  Temp: (!) 97.5 F (36.4 C)  SpO2: 98%  Weight: 229 lb (103.9 kg)     Physical Exam  Constitutional: She is oriented to person, place, and time. She appears well-developed and well-nourished.  HENT:  Head: Normocephalic and atraumatic.  Right Ear: External ear normal.  Left Ear: External ear normal.  Nose: Nose normal.  Eyes: Conjunctivae are normal. No scleral icterus.  Neck: No thyromegaly present.  Cardiovascular: Normal rate and regular rhythm.  Pulmonary/Chest: Effort normal and breath sounds normal.  Abdominal: Soft.  Musculoskeletal: She exhibits no edema.  Neurological: She is alert and oriented to person, place, and time.  Skin: Skin is warm and dry.  Eczematous lesion anterior left auricle  with tenderness and possible secondary mild cellulitis.  Psychiatric: She has a normal mood and affect. Her behavior is normal. Judgment and thought content normal.        Assessment & Plan:     1. Acute otitis externa of left ear, unspecified type  - mupirocin cream (BACTROBAN) 2 %; Apply 1 application topically daily.  Dispense: 15 g; Refill: 0  2. Other migraine without status migrainosus, not intractable   3. Type 2 diabetes mellitus with complication, with long-term current use of insulin (HCC) Very labile BSs. - Continuous Blood Gluc Sensor (FREESTYLE LIBRE 14 DAY SENSOR) MISC; 1 kit by Does not apply route daily.  Dispense: 1 each; Refill: 0  4. TIA (transient ischemic attack)  5. Cerebrovascular accident (CVA) due to occlusion of left middle cerebral artery ([place])   6. 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  7. Poor mobility   8. Class 3 severe obesity due to excess calories with serious comorbidity and body mass index (BMI) of 40.0 to 44.9 in adult [facility])       I have done the exam and reviewed the above chart and it is accurate to the best of my knowledge. Development worker, community has been used in this note in any air is in the dictation or transcription are unintentional.  Wilhemena Durie, MD  [place]

## 2018-04-17 ENCOUNTER — Telehealth: Payer: Self-pay | Admitting: Family Medicine

## 2018-04-17 NOTE — Telephone Encounter (Signed)
Jody w/ pharmacy is calling regarding pt calling waiting the pharmacy for the topical Rx that was supposed tto be called in for her itchy ear. He was checking on the status of the Rx to be called in for pt.   Please advise.  Thanks, [employer]

## 2018-04-17 NOTE — Telephone Encounter (Signed)
Please advise   Thanks,    -Laura  

## 2018-04-24 ENCOUNTER — Telehealth: Payer: Self-pay | Admitting: Family Medicine

## 2018-04-24 NOTE — Telephone Encounter (Signed)
Has referral to endocrine been ordered?

## 2018-04-24 NOTE — Telephone Encounter (Signed)
Needing Verbal Orders to extend PT for pt to: 2 times a week for 1 week 1 time a week for 1 week  Reporting:  Blood sugar swings from 300 to 400 and down to 70 and 80's.  Please advise.  Thanks, [employer]

## 2018-04-25 ENCOUNTER — Other Ambulatory Visit: Payer: Self-pay

## 2018-04-25 DIAGNOSIS — E109 Type 1 diabetes mellitus without complications: Secondary | ICD-10-CM

## 2018-04-25 NOTE — Telephone Encounter (Signed)
Advised and referral ordered

## 2018-04-26 DIAGNOSIS — E119 Type 2 diabetes mellitus without complications: Secondary | ICD-10-CM | POA: Diagnosis not present

## 2018-04-26 DIAGNOSIS — E1140 Type 2 diabetes mellitus with diabetic neuropathy, unspecified: Secondary | ICD-10-CM | POA: Diagnosis not present

## 2018-04-26 DIAGNOSIS — L851 Acquired keratosis [keratoderma] palmaris et plantaris: Secondary | ICD-10-CM | POA: Diagnosis not present

## 2018-04-26 DIAGNOSIS — B351 Tinea unguium: Secondary | ICD-10-CM | POA: Diagnosis not present

## 2018-04-29 ENCOUNTER — Telehealth: Payer: Self-pay | Admitting: Family Medicine

## 2018-04-29 NOTE — Telephone Encounter (Signed)
Pt called saying she is not going to see an endocrinologist until she talks to you again. She knows the home health PT suggested she see one but she does not think she needs to.   She also stated that she is not going to see the PT at her home any more for right now.  She would like to discontinue that service  Pt's CB # [phone]  Thanks Con Memos

## 2018-04-29 NOTE — Telephone Encounter (Signed)
Please review. Thanks!  

## 2018-04-30 ENCOUNTER — Ambulatory Visit (INDEPENDENT_AMBULATORY_CARE_PROVIDER_SITE_OTHER): Payer: PPO | Admitting: Neurology

## 2018-04-30 ENCOUNTER — Encounter: Payer: Self-pay | Admitting: Neurology

## 2018-04-30 VITALS — BP 110/62 | HR 72 | Ht 62.0 in | Wt 233.0 lb

## 2018-04-30 DIAGNOSIS — I69398 Other sequelae of cerebral infarction: Secondary | ICD-10-CM | POA: Diagnosis not present

## 2018-04-30 DIAGNOSIS — G9389 Other specified disorders of brain: Secondary | ICD-10-CM

## 2018-04-30 DIAGNOSIS — I639 Cerebral infarction, unspecified: Secondary | ICD-10-CM

## 2018-04-30 DIAGNOSIS — R269 Unspecified abnormalities of gait and mobility: Secondary | ICD-10-CM | POA: Diagnosis not present

## 2018-04-30 DIAGNOSIS — G40909 Epilepsy, unspecified, not intractable, without status epilepticus: Secondary | ICD-10-CM

## 2018-04-30 DIAGNOSIS — Z8673 Personal history of transient ischemic attack (TIA), and cerebral infarction without residual deficits: Secondary | ICD-10-CM | POA: Diagnosis not present

## 2018-04-30 DIAGNOSIS — I739 Peripheral vascular disease, unspecified: Secondary | ICD-10-CM | POA: Diagnosis not present

## 2018-04-30 DIAGNOSIS — G459 Transient cerebral ischemic attack, unspecified: Secondary | ICD-10-CM

## 2018-04-30 DIAGNOSIS — R569 Unspecified convulsions: Secondary | ICD-10-CM | POA: Diagnosis not present

## 2018-04-30 DIAGNOSIS — I69322 Dysarthria following cerebral infarction: Secondary | ICD-10-CM | POA: Diagnosis not present

## 2018-04-30 MED ORDER — LEVETIRACETAM 500 MG PO TABS: 500.0000 mg | ORAL_TABLET | Freq: Two times a day (BID) | ORAL | 3 refills | Status: DC

## 2018-04-30 NOTE — Progress Notes (Signed)
PATIENT: Susan House DOB: June 12, 1956  REASON FOR VISIT: follow up- TIA , hospitalization for acute worsening- stroke, restless leg syndrome, left hemiparesis HISTORY FROM: patient and husband   HISTORY OF PRESENT ILLNESS:  04-30-2018, I have the pleasure of meeting today with Susan House, and her husband . Susan House has a history of progressive small vessel disease, she was admitted to [facility] on 31 March 2018 and stayed overnight she was discharged on the 17th.Mr. Fabio House states that his wife had slurred speech and seemed to have worsening of left arm and leg weakness that was noted around 11 AM of the morning of 15 September.  She felt very sluggish went to church continued to feel worse.  All of a sudden her left leg got very weak-  she was unable to lift it off the ground.  Also, these changes lasted only 24 hours- and she was not confused.    It appears that she presented with slurred speech in the setting of previous strokes, hypertension, hyperlipidemia and diabetes mellitus.  She underwent an MRI of the head which did not show any new stroke, no acute infarct or hemorrhage or mass-effect.  Old left frontal lobe infarct and multiple ganglia thalamic lacunar infarcts noted.  None of them fresh.  There is ex vacuo dilation of the left lateral ventricle and generalized age advanced atrophy of the brain with microvascular disease, she was seen by tele-neurology, the ED had Susan House called a code stroke.  She also underwent an carotid studies and her right carotid bifurcation shows an estimated 50 to 70% proximal ICA stenosis velocity meet criteria for a greater than 70% stenosis though.  The interpreting radiologist Dr. Aletta Edouard, appreciated a stable appearance of moderate plaque at the right carotid bifurcation.  There was also a stable appearance of the plaque at the left carotid bifurcation here with less than 50% stenosis.  Blood chemistry showed  nonfasting glucose level of 183, BUN 27, creatinine 1.12, sodium 141 potassium 3.9 hemoglobin 11.2 hematocrit 33.1.  Muscle strength in her left upper and lower extremities is reduced to 2 out of 5 historically but he has 5 out of 5 in her right extremities.  The slurred speech is still noted today also neither she nor her husband are sure if it is actually improved or if it was absolutely At the day of her presentation.  We do not have any evidence of a new stroke so at baseline this may have been a TIA or could have been an effect of a seizure?  She was not extremely hyperglycemic, her blood pressure was not out of control, she was not on any medications that confused her. She  Had Pt at home and she is not back to baseline stated her husband- and she insists she is back to baseline. Her speech seems still very different  From her baseline.    Susan House is seen on 10 October 2017, the patient is well established with our practice.  She was seen at [facility] on 04 October 2017 with evolving left-sided hemiparesis, was unable to stand and according to her husband felt weaker in her predisposed left side.  She has a history of removal remote lacunar infarcts in the internal capsule, basal ganglia has a small amount of chronic blood products.  The MRI MRA of the head did not reveal a new stroke and was compared to a prior CT from 03 October 2017 the day of admission.   All MRA related large vessels were patent.  She had some atheromatous irregularities in the flow of the MCA branches, ophthalmic arteries were normal, there is mild distal small vessel disease noted.  She had her first stroke about 19 years ago which initially left her with significant weakness in the left upper and lower extremities, she underwent extensive rehab and subsequently her strength had improved.   However in the morning hours of 20 March she started having worsening weakness of the left upper and lower  extremity as well as slurred speech.  After the MRI did not show an acute stroke the patient was worked up under the assumption that this was a TIA she underwent a transthoracic echocardiogram which showed an ejection fraction of 65-70%, normal left ventricle size, slightly calcified mitral valve, very mild stenosis almost trivial for the aortic valve, a moderately dilated left atrium there was no mentioning of pulmonary hypertension being found or not.  Risk factors were diabetes, hypertension, BMI of 44.7,  Susan House was placed back on Plavix,Plavix was discontinued after she had a GI bleed on 06-21-2016, colonoscopy confirmed ulceration 06-2016 .  She will continue amlodipine, Lipitor, Lomotil, insulin NovoLog, Lipitor Apresoline, hydrocodone for pain as needed prescribed by Dr. Rosanna Randy, Protonix, Ditropan, metoprolol, nitroglycerin as needed, metformin 1000 mg twice a day, and Keppra 500 mg tablets 2 tablets twice a day. I have the pleasure of seeing Susan House and her husband today on 04 July 2017, the patient had been evaluated in May for a possible seizure activity versus TIA.  Her EEG returned normal.  She had no more spells since.  Patient is no longer on baclofen, stroke prevention is provided by antiplatelet therapy with aspirin,She is pain-free, she reports no headaches, dizziness or palpitations.  She is walking with a  single point cane.  Today is 11/27/2016 and I have the pleasure of seeing Susan House in the presence of her husband, daughter and granddaughter. Susan House suffered a possible seizure episode on 10/15/2016. [name] daughter described which she had witnessed a day; the patient had fallen out of bed and onto a stool that was placed next to the bed she had very limitedrecollection of what happened and her husband called to the emergency room services. Her daughter met her at the hospital and found her mother still very confused she had speech arrest. She tried to communicate  by gesticulation. Was pantng, seemed in discomfort. VS were Ok. Patient remained restless and erratic, needing to nurses to observe her and basically help prevent injuries. Then within an hour or so the patient became suddenly fully aware of her surroundings. The patient had received nitroglycerin for chest pain, she had some aspirin after her stroke had been ruled out, nausea medication, but she was not given a seizure medicine or sedation.  The EEG during hospitalization was read by Dr. Alexis Goodell and described a drowsy rhythm with rare left temporal sharp transients and phase reversal at the third temporal lead. Photic stimulation did not show any changes, hyperventilation was deferred. The finding was suggestive of a focal disturbance with epileptogenic potential.The patient was treated by Dr. Tressia Miners, and the discharge diagnosis on April 3 with acute encephalopathy with a new onset seizure, abnormal EEG, started on Keppra 500 mg twice a day to follow-up with neurology outpatient, no driving. She has taking it and feels slowed, thought process delay. Her family feels she is snappy and angry since starting  the medication.  She takes narco for back pain. Other medications were baclofen, Lipitor, Flexeril, Lomotil, enalapril, gabapentin, magnesium oxide insulin did troponin, Protonix, nitroglycerin, Toprol. Plavix was discontinued after she had a GI bleed, colonoscopy confirmed ulceration 06-2016 . May re start after 6 month.  MM- 2017 [name] is a 62 year old female with a history of stroke with subsequent left hemiparesis. Her stroke occurred in 2000. She reports that she has continued on aspirin and Plavix. She is tolerating these medications well. Her blood pressure is slightly elevated today. Her primary care is managing her blood pressure medications. Reports that her last hemoglobin A1c was 8.4. She is currently on Lipitor for her cholesterol. She denies any additional strokelike symptoms.  She states that she does have difficulty walking long distances and for that reason she will use a wheelchair. She has very limited movement of the left arm. She has found baclofen to be beneficial for spasms. She also has a history of restless leg symptoms. Her primary care tried her on Requip however she did not find it beneficial. She is currently on gabapentin not specifically for restless leg symptoms. She states that her symptoms are tolerable but does at times interfere with her sleep. She returns today for an evaluation.  HISTORY (Dr. Edwena Felty note): Susan House is a 62 year old Caucasian female patient with significant left hemiparetic residual and left homonymous hemianopsia after her stroke she suffered in 2000. She has continued to take Plavix and aspirin and her risk factors have not changed significantly since our last visit. She has a history of his hypertension, migraine headaches, gastroesophageal reflux, diabetes, hyperlipidemia, obesity, irritable bowel syndrome, urinary incontinence, and was last admitted with a UTI to [facility] in [place]. Admission date was 12/26/2015 under the guidance of Dr. Fulton Reek. Primary doctor is Miguel Aschoff Junior discharge date was 12/28/2015. The patient was diagnosed with altered mental status due to urosepsis. She was treated with 3 antibiotics. Upon discharge the patient was told that she did not have an infection? She was diagnosed with systemic inflammatory response syndrome and encephalopathy. A CT of the head was obtained on the 11th of June, it showed no acute intracranial abnormalities, an MRI of the brain with and without contrast followed on 12/28/2015."   It appeared stable in comparison to a brain image from 2015 with chronic deep left intracranial gliosis, postoperative changes to both globes after cataract surgery, some minor nasal sinus mucosal thickening. Chronic encephalomalacia in the left MCA  territory with ex vacuo enlargement of the left frontal horn of the ventricle" she had that stroke in 2000.  The patient reports that on Saturday preceding her admission to the hospital she had worked on her parents state ready to dissolve the estate. She was at least 8 hours out on a very hot and sunny day working outside not sure that she hydrated properly developing a terrific headache she cannot remember what happened that night from Saturday to Sunday and her husband reports that she was not able to sleep for a couple of nights. He accompanied her to the hospital. It is likely that she had a heat related accident. She missed a couple of medicine doses as well.    REVIEW OF SYSTEMS: Out of a complete 14 system review of symptoms, the patient complains only of the following symptoms, and all other reviewed systems are negative.  She is back to baseline after presumed TIA. No seizure .  ALLERGIES: Allergies  Allergen Reactions  .  Morphine And Related Anaphylaxis  . Fentanyl Other (See Comments)    Unknown reaction.  . Reglan [Metoclopramide]     Other reaction(s): Unknown Elevated BP  . Simvastatin Other (See Comments)    Muscle pain  . Betadine [Povidone Iodine] Rash  . Iodine Rash    Other reaction(s): Unknown-not allergic to ct contrast-ars 04/01/18  . Tetracyclines & Related Rash    Other reaction(s): Unknown    HOME MEDICATIONS: Outpatient Medications Prior to Visit  Medication Sig Dispense Refill  . ACCU-CHEK SOFTCLIX LANCETS lancets CHECK BLOOD SUGAR THREE TIMES DAILY 300 each 3  . amLODipine (NORVASC) 10 MG tablet Take 1 tablet (10 mg total) by mouth daily. 90 tablet 3  . atorvastatin (LIPITOR) 20 MG tablet Take 20 mg by mouth daily.    . Blood Glucose Monitoring Suppl (ACCU-CHEK AVIVA PLUS) w/Device KIT Check sugar three times daily DX E11.9-needs a meter 1 kit 0  . cloNIDine (CATAPRES) 0.3 MG tablet Take 1 tablet (0.3 mg total) by mouth 2 (two) times daily. 60 tablet 12    . clopidogrel (PLAVIX) 75 MG tablet Take 1 tablet (75 mg total) by mouth daily. 90 tablet 3  . Continuous Blood Gluc Sensor (FREESTYLE LIBRE 14 DAY SENSOR) MISC 1 kit by Does not apply route daily. 1 each 0  . cyclobenzaprine (FLEXERIL) 5 MG tablet Take 1 tablet (5 mg total) by mouth 3 (three) times daily as needed for muscle spasms. 30 tablet 1  . diphenoxylate-atropine (LOMOTIL) 2.5-0.025 MG tablet 2 tablets initially and then 1 tablet every 6 hours as needed for diarrhea 60 tablet 1  . enalapril (VASOTEC) 20 MG tablet Take 1 tablet (20 mg total) by mouth 2 (two) times daily. 180 tablet 2  . glucose blood (ACCU-CHEK AVIVA PLUS) test strip TEST BLOOD SUGAR THREE TIMES DAILY 300 each 3  . hydrALAZINE (APRESOLINE) 100 MG tablet Take 1 tablet (100 mg total) by mouth 2 (two) times daily. 60 tablet 11  . HYDROcodone-acetaminophen (NORCO) 10-325 MG tablet Take 1-2 tablets by mouth every 6 (six) hours as needed. 100 tablet 0  . insulin aspart protamine- aspart (NOVOLOG MIX 70/30) (70-30) 100 UNIT/ML injection Inject 10-35 Units into the skin 2 (two) times daily.     [employer]more Gu[employer] Devices (ACCU-CHEK SOFTCLIX) lancets Check sugar three times daily DX E11.9 100 each 3  . levETIRAcetam (KEPPRA) 500 MG tablet Take 1 tablet (500 mg total) by mouth 2 (two) times daily. 500 mg bid. 180 tablet 3  . meclizine (ANTIVERT) 25 MG tablet Take 1 tablet (25 mg total) by mouth 3 (three) times daily as needed for dizziness. 30 tablet 0  . metFORMIN (GLUCOPHAGE) 1000 MG tablet Take 1 tablet (1,000 mg total) by mouth 2 (two) times daily. 180 tablet 3  . metoprolol succinate (TOPROL-XL) 100 MG 24 hr tablet Take 1 tablet (100 mg total) by mouth daily. 90 tablet 3  . mupirocin cream (BACTROBAN) 2 % Apply 1 application topically daily. 15 g 0  . nitroGLYCERIN (NITROSTAT) 0.4 MG SL tablet Place 1 tablet (0.4 mg total) under the tongue every 5 (five) minutes as needed for chest pain. 25 tablet 0  . nystatin cream (MYCOSTATIN) Apply 1  application topically 2 (two) times daily. 30 g 0  . oxybutynin (DITROPAN) 5 MG tablet Take 1 tablet (5 mg total) by mouth 2 (two) times daily. 180 tablet 3  . pantoprazole (PROTONIX) 40 MG tablet Take 1 tablet (40 mg total) by mouth daily. 90 tablet 3  .  prochlorperazine (COMPAZINE) 5 MG tablet Take 1 tablet (5 mg total) by mouth every 6 (six) hours as needed for nausea or vomiting. 20 tablet 0  . ranitidine (ZANTAC) 150 MG capsule Take 1 capsule (150 mg total) by mouth every evening. 30 capsule 12  . triamcinolone cream (KENALOG) 0.1 % Can use thin layer on affected areas 2x daily for one week at a time. Not for face. 30 g 0   No facility-administered medications prior to visit.     PAST MEDICAL HISTORY: Past Medical History:  Diagnosis Date  . Allergy   . Arthritis   . Cellulitis and abscess of face   . Depression   . Diabetes mellitus ([place])   . Edema   . GERD (gastroesophageal reflux disease)   . Hematuria   . History of echocardiogram    2008: normal, 2011: normal LVSF  . History of nuclear stress test    a. 12/2009: lexiscan - negative  . Hyperlipidemia   . Hypertension   . IBS (irritable bowel syndrome)   . Migraine   . Morbid obesity ([place])   . Nocturia   . Stroke ([place]) 2000  . TIA (transient ischemic attack) 2010  . Urgency of micturation   . Urinary frequency   . Urinary incontinence     PAST SURGICAL HISTORY: Past Surgical History:  Procedure Laterality Date  . ABDOMINAL HYSTERECTOMY    . ABDOMINOPLASTY    . APPENDECTOMY    . BACK SURGERY     extensive spinal fusion  . carpel tunnel surgery    . CATARACT EXTRACTION     x 2  . CESAREAN SECTION    . CHOLECYSTECTOMY    . COLONOSCOPY  2010  . COLONOSCOPY WITH PROPOFOL N/A 07/11/2016   Procedure: COLONOSCOPY WITH PROPOFOL;  Surgeon: Lucilla Lame, MD;  Location: [facility] ENDOSCOPY;  Service: Endoscopy;  Laterality: N/A;  . COLONOSCOPY WITH PROPOFOL N/A 03/26/2018   Procedure: COLONOSCOPY WITH PROPOFOL;  Surgeon:  Lucilla Lame, MD;  Location: [facility] ENDOSCOPY;  Service: Endoscopy;  Laterality: N/A;  . ESOPHAGOGASTRODUODENOSCOPY (EGD) WITH PROPOFOL N/A 03/26/2018   Procedure: ESOPHAGOGASTRODUODENOSCOPY (EGD) WITH PROPOFOL;  Surgeon: Lucilla Lame, MD;  Location: [facility] ENDOSCOPY;  Service: Endoscopy;  Laterality: N/A;  . EYE SURGERY    . HERNIA REPAIR    . SHOULDER SURGERY    . TOE SURGERY    . UPPER GI ENDOSCOPY    . WRIST FRACTURE SURGERY Left     FAMILY HISTORY: Family History  Problem Relation Age of Onset  . Hyperlipidemia Mother   . Arrhythmia Mother        WPW  . Hypertension Mother   . Rheum arthritis Mother   . Heart disease Mother   . Heart attack Father 29  . Hyperlipidemia Father   . Hypertension Father   . Stroke Father   . Diabetes Father   . Heart disease Father   . Coronary artery disease Father   . Diabetes Sister   . Hyperlipidemia Sister   . Hypertension Sister   . Depression Sister   . Depression Sister   . Diabetes Sister   . Hypertension Sister   . Hyperlipidemia Sister   . Kidney disease Paternal Grandmother     SOCIAL HISTORY: Social History   Socioeconomic History  . Marital status: Married    Spouse name: Marcello Moores   . Number of children: 2  . Years of education: 43  . Highest education level: Not on file  Occupational History  .  Occupation: Disabled   . Occupation: retired  Scientific laboratory technician  . Financial resource strain: Somewhat hard  . Food insecurity:    Worry: Patient refused    Inability: Patient refused  . Transportation needs:    Medical: No    Non-medical: No  Tobacco Use  . Smoking status: Never Smoker  . Smokeless tobacco: Never Used  Substance and Sexual Activity  . Alcohol use: No  . Drug use: No  . Sexual activity: Never  Lifestyle  . Physical activity:    Days per week: Patient refused    Minutes per session: Patient refused  . Stress: Only a little  Relationships  . Social connections:    Talks on phone: More than three times a  week    Gets together: More than three times a week    Attends religious service: More than 4 times per year    Active member of club or organization: Patient refused    Attends meetings of clubs or organizations: 1 to 4 times per year    Relationship status: Married  . Intimate partner violence:    Fear of current or ex partner: No    Emotionally abused: No    Physically abused: No    Forced sexual activity: No  Other Topics Concern  . Not on file  Social History Narrative   ** Merged History Encounter **       Patient lives at home with husband Marcello Moores.    Patient has 2 children and 2 step children.    Patient has 12+ years of education.    Patient is Disabled.       PHYSICAL EXAM  Vitals:   04/30/18 1002  BP: 110/62  Pulse: 72  Weight: 233 lb (105.7 kg)  Height: 5' 2"  (1.575 m)   Body mass index is 42.62 kg/m.  Generalized: Well developed, in no acute distress, well groomed. Cooperative    Neurological examination  Mentation: Alert oriented to time, place, history taking. She speaks haltering,  Cranial nerve :  No change in taste or smell,  Pupils were equal, status post cataract, she has diabetic retinopathy.    Extraocular movements were full, visual field were full on confrontational test. Facial sensation normal.facial asymmetry with drooling out of left angle of her mouth.  Uvula and tongue deviation . Head turning and shoulder shrug were intact .  Right shoulder is lower.  Motor: Limited movement in the left arm particularly in the elbow and hands. Left arm muscle bulk atrophy.  Spasticity over left extremities - DTR brisk.  Sensory: Sensory testing is intact to soft touch on all 4 extremities however slightly decreased on the left hand. Coordination: Cerebellar testing reveals good finger-nose-finger on the right, with mild dysmetria and pronator drift  difficulty on the left. Reflexes: 2/2  Cannot rise from seated position without bracing, 3 attempts.    Coordination and gait - everted left foot, cannot bear weight, leaning onto cane.  Turning  with almost 8 steps. Stopped posture.    DIAGNOSTIC DATA (LABS, IMAGING, TESTING) - I reviewed patient records, labs, notes, testing and imaging myself where available. I reviewed the patient's 9-17 -2019 brain MRI and MRA, Echocardiogram, carotid doppler.   Lab Results  Component Value Date   WBC 6.4 03/31/2018   HGB 11.2 (L) 03/31/2018   HCT 33.1 (L) 03/31/2018   MCV 87.7 03/31/2018   PLT 220 03/31/2018      Component Value Date/Time   NA 141 03/31/2018 1252  NA 139 12/25/2017 1600   NA 140 06/17/2014 0744   K 3.9 03/31/2018 1252   K 4.1 06/17/2014 0744   CL 106 03/31/2018 1252   CL 105 06/17/2014 0744   CO2 26 03/31/2018 1252   CO2 27 06/17/2014 0744   GLUCOSE 183 (H) 03/31/2018 1252   GLUCOSE 116 (H) 06/17/2014 0744   BUN 27 (H) 03/31/2018 1252   BUN 14 12/25/2017 1600   BUN 35 (H) 06/17/2014 0744   CREATININE 1.12 (H) 03/31/2018 1252   CREATININE 1.29 06/17/2014 0744   CALCIUM 8.9 03/31/2018 1252   CALCIUM 8.6 06/17/2014 0744   PROT 6.8 03/31/2018 1252   PROT 6.8 12/25/2017 1600   PROT 6.5 01/18/2014 0059   ALBUMIN 3.7 03/31/2018 1252   ALBUMIN 4.3 12/25/2017 1600   ALBUMIN 3.0 (L) 01/18/2014 0059   AST 21 03/31/2018 1252   AST 21 01/18/2014 0059   ALT 19 03/31/2018 1252   ALT 18 01/18/2014 0059   ALKPHOS 73 03/31/2018 1252   ALKPHOS 97 01/18/2014 0059   BILITOT 0.6 03/31/2018 1252   BILITOT 0.4 12/25/2017 1600   BILITOT 0.3 01/18/2014 0059   GFRNONAA 52 (L) 03/31/2018 1252   GFRNONAA 45 (L) 06/17/2014 0744   GFRNONAA 47 (L) 01/18/2014 0059   GFRAA 60 (L) 03/31/2018 1252   GFRAA 55 (L) 06/17/2014 0744   GFRAA 55 (L) 01/18/2014 0059   Lab Results  Component Value Date   CHOL 102 04/01/2018   HDL 29 (L) 04/01/2018   LDLCALC 33 04/01/2018   TRIG 198 (H) 04/01/2018   CHOLHDL 3.5 04/01/2018   Lab Results  Component Value Date   HGBA1C 9.5 (H) 04/01/2018    Lab Results  Component Value Date   VITAMINB12 551 01/15/2009   Lab Results  Component Value Date   TSH 1.320 12/25/2017      ASSESSMENT AND PLAN 62 y.o. year old female  has a past medical history of Allergy, Arthritis, Cellulitis and abscess of face, Depression, Diabetes mellitus ([place]), Edema, GERD (gastroesophageal reflux disease), Hematuria, History of echocardiogram, History of nuclear stress test, Hyperlipidemia, Hypertension, IBS (irritable bowel syndrome), Migraine, Morbid obesity ([place]), Nocturia, Stroke ([place]) (2000), TIA (transient ischemic attack) (2010), Urgency of micturation, Urinary frequency, and Urinary incontinence. here with:   TIA - transient event over 24 hours or less,  5 days after colonoscopy and being for 6 days off Plavix- ,  history of left hemispheric stroke in 2000, hemiplegia work up  at [facility]-  .   New spell of left sided weakness- slurring of speech. Had diarrhea in the month of August- September and lost 25 pounds . She restarted on plavix and D/C  ASA 81 mg in March 2019.  She was taken off Plavix in preparation for colonoscopy- 03-26-2018, and resumed the medications on or after 03-28-2018.  History of stroke with enkephalomalacia. New MRI confirmed yet again no new stroke. Possible  TIA-    A) Uncontrolled blood pressures on multiple medication-  Has not had renal artery stenosis work up yet,she reports-  ECHO normal.carotid doppler to be reviewwd by STROKE MD. Has remote lacunar strokes.  Marland Kitchen  B) DM running high- and has been uncontrolled for years .    2. Left hemiparesis post CVA is in my opinion unchanged  , stabilized gait disorder, but very slurred,  slowed and haltering speech.  She needs to move - needs to walk- preferably indoors at a gym, [employer], or in a mall. I  Like  her to resume water exercises.    3. Possible single seizure ( ? ) in 2018 with postiktal confusion. Had multiple spells before.   Stays on Keppra 500 mg bid for  control- has not had another spell. EEG normal in office, supposingly abnormal in hospital. She stays on Keppra as she is allowed to drive now and doesn't want to lose that previlege for 6 month after d/c seizure medications.   4) renal artery evaluation at [facility]. See of HTN is related to a renal artery stenosis.     The patient has brain atrophy, ex vacuo atrophy and asymmetry of ventricles. There is one embolic stroke and many,many lacunes in the basal gangliae. Small vessel disease and large vessel disease. Risk factors are HTN and DM.     Rv in January  with Stroke Service ,  NP, Venancio Poisson, alternating with me.   Larey Seat, MD   04/30/2018, 10:15 AM [facility] [address] [address], [place] [address] [phone]

## 2018-04-30 NOTE — Patient Instructions (Signed)
Transient Ischemic Attack °A transient ischemic attack (TIA) is a "warning stroke" that causes stroke-like symptoms. A TIA does not cause lasting damage to the brain. The symptoms of a TIA can happen fast and do not last long. It is important to know the symptoms of a TIA and what to do. This can help prevent stroke or death. °Follow these instructions at home: °· Take medicines only as told by your doctor. Make sure you understand all of the instructions. °· You may need to take aspirin or warfarin medicine. Warfarin needs to be taken exactly as told. °? Taking too much or too little warfarin is dangerous. Blood tests must be done as often as told by your doctor. A PT blood test measures how long it takes for blood to clot. Your PT is used to calculate another value called an INR. Your PT and INR help your doctor adjust your warfarin dosage. He or she will make sure you are taking the right amount. °? Food can cause problems with warfarin and affect the results of your blood tests. This is true for foods high in vitamin K. Eat the same amount of foods high in vitamin K each day. Foods high in vitamin K include spinach, kale, broccoli, cabbage, collard and turnip greens, Brussels sprouts, peas, cauliflower, seaweed, and parsley. Other foods high in vitamin K include beef and pork liver, green tea, and soybean oil. Eat the same amount of foods high in vitamin K each day. Avoid big changes in your diet. Tell your doctor before changing your diet. Talk to a food specialist (dietitian) if you have questions. °? Many medicines can cause problems with warfarin and affect your PT and INR. Tell your doctor about all medicines you take. This includes vitamins and dietary pills (supplements). Do not take or stop taking any prescribed or over-the-counter medicines unless your doctor tells you to. °? Warfarin can cause more bruising or bleeding. Hold pressure over any cuts for longer than normal. Talk to your doctor about other  side effects of warfarin. °? Avoid sports or activities that may cause injury or bleeding. °? Be careful when you shave, floss, or use sharp objects. °? Avoid or drink very little alcohol while taking warfarin. Tell your doctor if you change how much alcohol you drink. °? Tell your dentist and other doctors that you take warfarin before any procedures. °· Follow your diet program as told, if you are given one. °· Keep a healthy weight. °· Stay active. Try to get at least 30 minutes of activity on all or most days. °· Do not use any tobacco products, including cigarettes, chewing tobacco, or electronic cigarettes. If you need help quitting, ask your doctor. °· Limit alcohol intake to no more than 1 drink per day for nonpregnant women and 2 drinks per day for men. One drink equals 12 ounces of beer, 5 ounces of wine, or 1½ ounces of hard liquor. °· Do not abuse drugs. °· Keep your home safe so you do not fall. You can do this by: °? Putting grab bars in the bedroom and bathroom. °? Raising toilet seats. °? Putting a seat in the shower. °· Keep all follow-up visits as told by your doctor. This is important. °Contact a doctor if: °· Your personality changes. °· You have trouble swallowing. °· You have double vision. °· You are dizzy. °· You have a fever. °Get help right away if: °These symptoms may be an emergency. Do not wait to see if   the symptoms will go away. Get medical help right away. Call your local emergency services (911 in the [place] Do not drive yourself to the hospital. °· You have sudden weakness or lose feeling (go numb), especially on one side of the body. This can affect your: °? Face. °? Arm. °? Leg. °· You have sudden trouble walking. °· You have sudden trouble moving your arms or legs. °· You have sudden confusion. °· You have trouble talking. °· You have trouble understanding. °· You have sudden trouble seeing in one or both eyes. °· You lose your balance. °· Your movements are not smooth. °· You  have a sudden, very bad headache with no known cause. °· You have new chest pain. °· Your heartbeat is unsteady. °· You are partly or totally unaware of what is going on around you. ° °This information is not intended to replace advice given to you by your health care provider. Make sure you discuss any questions you have with your health care provider. °Document Released: 04/11/2008 Document Revised: 03/06/2016 Document Reviewed: 10/08/2013 °Elsevier Interactive Patient Education © 2018 [employer] ° °

## 2018-05-02 ENCOUNTER — Telehealth: Payer: Self-pay | Admitting: Neurology

## 2018-05-02 ENCOUNTER — Other Ambulatory Visit: Payer: Self-pay | Admitting: Neurology

## 2018-05-02 DIAGNOSIS — G9389 Other specified disorders of brain: Secondary | ICD-10-CM

## 2018-05-02 DIAGNOSIS — Z8673 Personal history of transient ischemic attack (TIA), and cerebral infarction without residual deficits: Secondary | ICD-10-CM

## 2018-05-02 DIAGNOSIS — I639 Cerebral infarction, unspecified: Secondary | ICD-10-CM

## 2018-05-02 DIAGNOSIS — I739 Peripheral vascular disease, unspecified: Secondary | ICD-10-CM

## 2018-05-02 DIAGNOSIS — N183 Chronic kidney disease, stage 3 unspecified: Secondary | ICD-10-CM

## 2018-05-02 DIAGNOSIS — I69322 Dysarthria following cerebral infarction: Secondary | ICD-10-CM

## 2018-05-02 DIAGNOSIS — E0822 Diabetes mellitus due to underlying condition with diabetic chronic kidney disease: Secondary | ICD-10-CM

## 2018-05-02 DIAGNOSIS — I63512 Cerebral infarction due to unspecified occlusion or stenosis of left middle cerebral artery: Secondary | ICD-10-CM

## 2018-05-02 DIAGNOSIS — G459 Transient cerebral ischemic attack, unspecified: Secondary | ICD-10-CM

## 2018-05-02 NOTE — Telephone Encounter (Signed)
Order correct Susan House Will Call patient.

## 2018-05-02 NOTE — Telephone Encounter (Signed)
Myriam Jacobson or Dr. Brett Fairy  Will you please re- order - order # [id number]  - US RENAL ARTERY DUPLEX LIMITED  Patient want's at [facility]- [phone] . Thanks Hinton Dyer

## 2018-05-02 NOTE — Telephone Encounter (Signed)
Myriam Jacobson, would you please order renal vascular US for this patient at [facility]? Thank you.

## 2018-05-02 NOTE — Telephone Encounter (Signed)
Order placed

## 2018-05-06 ENCOUNTER — Ambulatory Visit (INDEPENDENT_AMBULATORY_CARE_PROVIDER_SITE_OTHER): Payer: PPO | Admitting: Family Medicine

## 2018-05-06 VITALS — BP 148/60 | HR 61 | Temp 98.4°F | Resp 16

## 2018-05-06 DIAGNOSIS — R531 Weakness: Secondary | ICD-10-CM | POA: Diagnosis not present

## 2018-05-06 DIAGNOSIS — Z6841 Body Mass Index (BMI) 40.0 and over, adult: Secondary | ICD-10-CM

## 2018-05-06 DIAGNOSIS — I69359 Hemiplegia and hemiparesis following cerebral infarction affecting unspecified side: Secondary | ICD-10-CM

## 2018-05-06 DIAGNOSIS — I63512 Cerebral infarction due to unspecified occlusion or stenosis of left middle cerebral artery: Secondary | ICD-10-CM

## 2018-05-06 DIAGNOSIS — R296 Repeated falls: Secondary | ICD-10-CM

## 2018-05-06 DIAGNOSIS — Z7409 Other reduced mobility: Secondary | ICD-10-CM | POA: Diagnosis not present

## 2018-05-06 DIAGNOSIS — I10 Essential (primary) hypertension: Secondary | ICD-10-CM
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
## 2018-05-07 ENCOUNTER — Telehealth: Payer: Self-pay | Admitting: Family Medicine
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
## 2018-05-07 IMAGING — MR MR MRA HEAD W/O CM
9 of 11 series · 31 of 48 positions shown · non-contrast
Comparison: 07/08/2016 CT. 12/28/2015 MR. 01/18/2014 MR and MR
angiogram.

CLINICAL DATA: 60-year-old hypertensive diabetic female with
history of prior infarct resulting in left-sided weakness. Recent
difficulty finding words lasting for 2 hours, now back to baseline.
Subsequent encounter.

EXAM:
MRI HEAD WITHOUT CONTRAST
MRA HEAD WITHOUT CONTRAST
TECHNIQUE: Multiplanar, multiecho pulse sequences of the brain and surrounding
structures were obtained without intravenous contrast. Angiographic
images of the head were obtained using MRA technique without
contrast.

[Series 2: GRE · sagittal · 5.0mm · 0.45mm/px · 3 of 23 slices shown (1 of 2)]
[im 1/23]
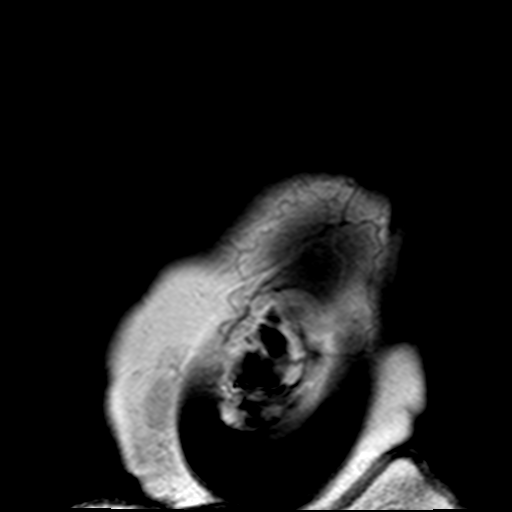
[im 12/23]
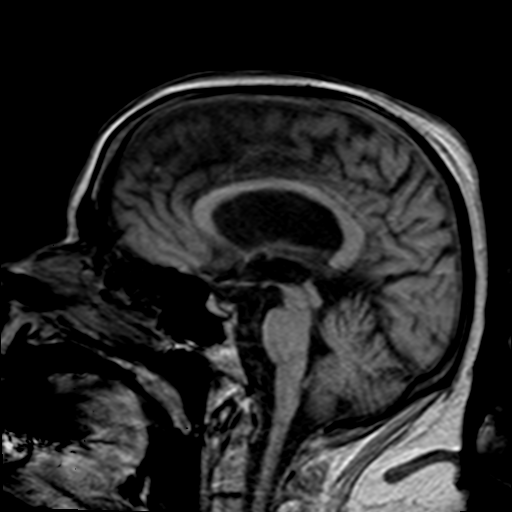
[im 23/23]
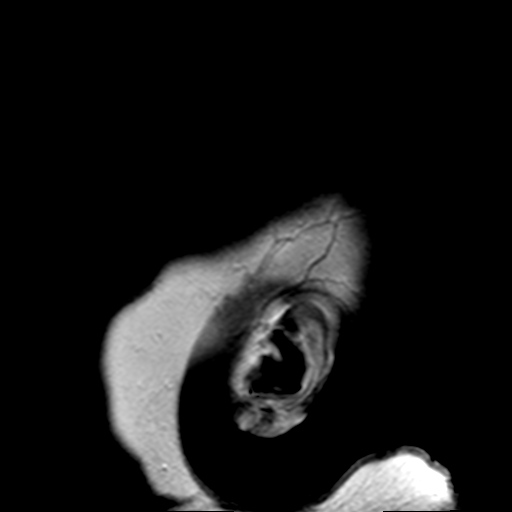

[Series 4: DWI · axial · 3.0mm · 1.80mm/px · z∈[-110,+41]mm · 6 of 52 slices shown (1 of 2)]
[im 1/52]
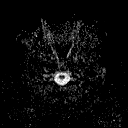
[im 11/52]
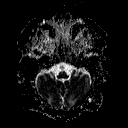
[im 21/52]
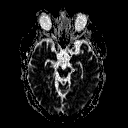
[im 31/52]
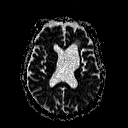
[im 41/52]
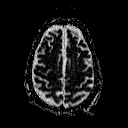
[im 52/52]
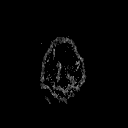

[Series 7: DWI · coronal · 3.0mm · 1.80mm/px · 5 of 46 slices shown (2 of 2)]
[im 1/46]
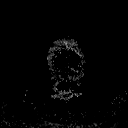
[im 12/46]
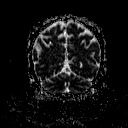
[im 23/46]
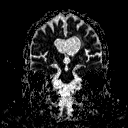
[im 34/46]
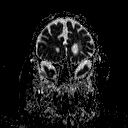
[im 46/46]
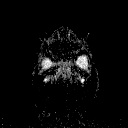

[Series 8: TOF · axial · non-contrast · 0.7mm · 0.37mm/px · z∈[-96,-28]mm · 6 of 143 slices shown]
[im 1/143]
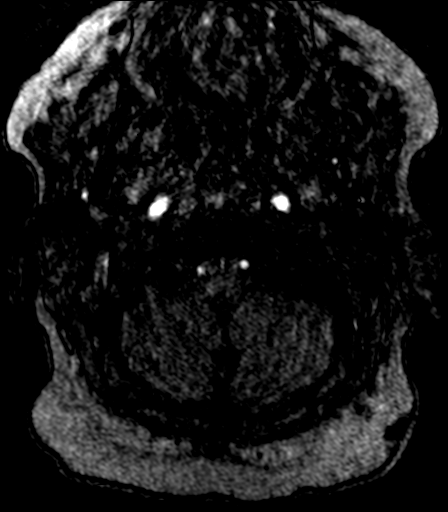
[im 22/143]
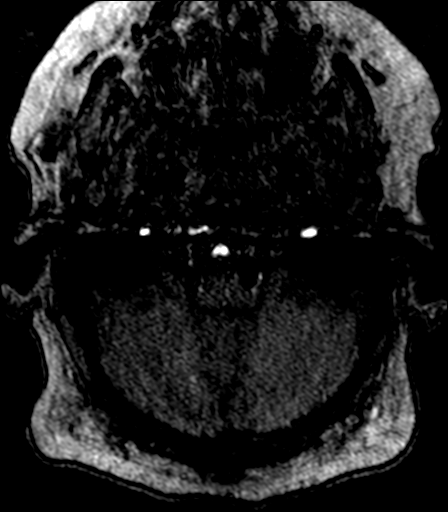
[im 44/143]
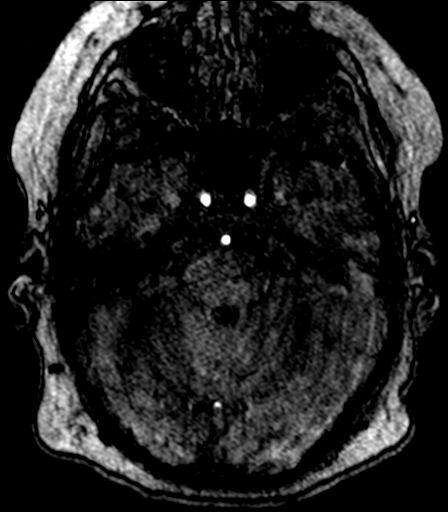
[im 66/143]
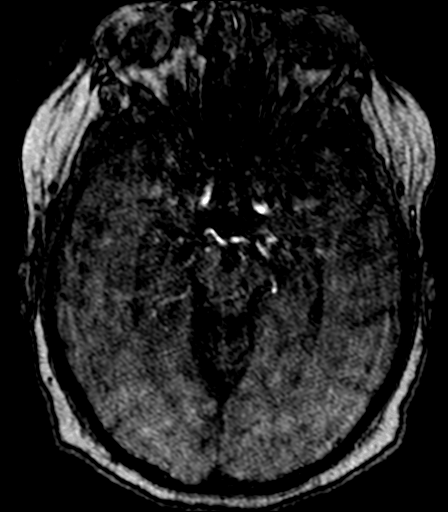
[im 77/143]
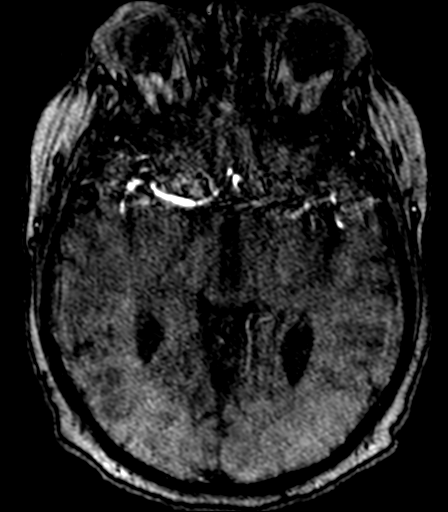
[im 99/143]
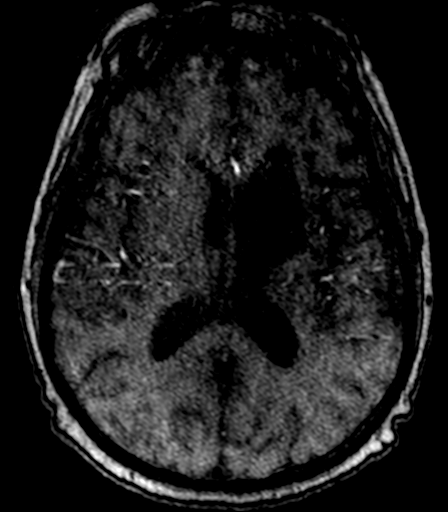

[Series 12: T2 · axial · 5.0mm · 0.45mm/px · z∈[-111,+43]mm · 2 of 24 slices shown (1 of 3)]
[im 1/24]
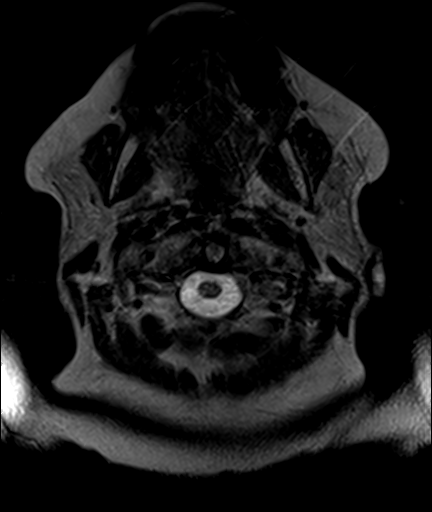
[im 24/24]
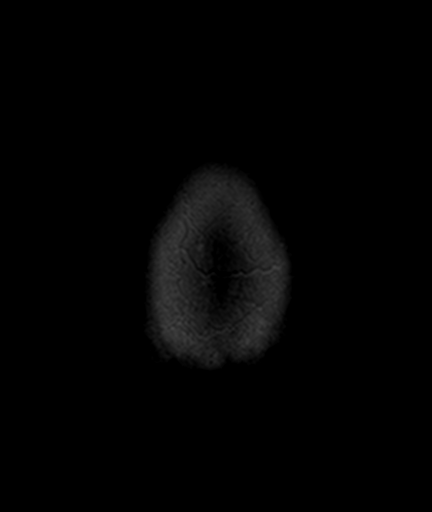

[Series 13: FLAIR · axial · 5.0mm · 0.45mm/px · z∈[-111,+43]mm · 2 of 25 slices shown]
[im 1/25]
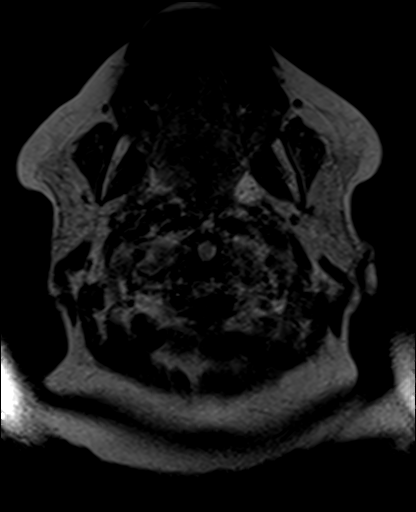
[im 25/25]
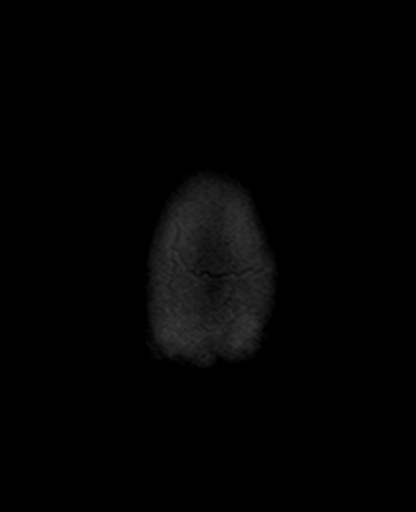

[Series 14: T2 · axial · 5.0mm · 1.20mm/px · z∈[-112,+42]mm · 2 of 25 slices shown (2 of 3)]
[im 1/25]
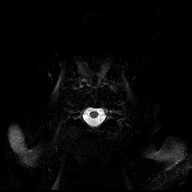
[im 25/25]
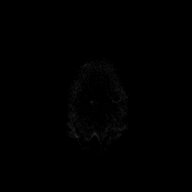

[Series 15: GRE · axial · 5.0mm · 0.45mm/px · z∈[-112,+42]mm · 2 of 25 slices shown (2 of 2)]
[im 1/25]
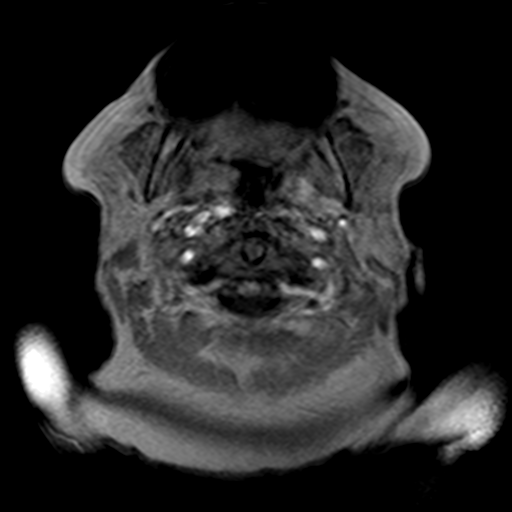
[im 25/25]
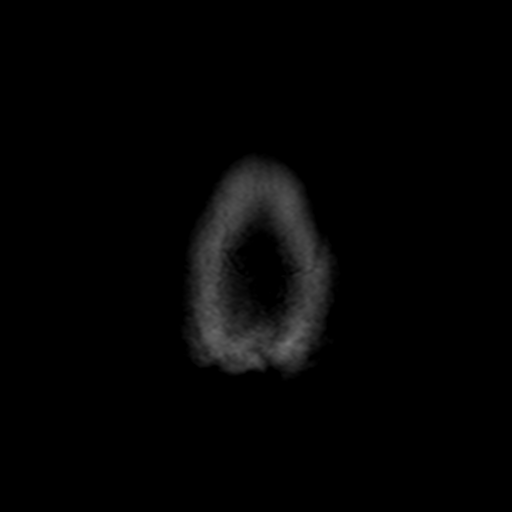

[Series 17: T2 · coronal · 5.0mm · 0.45mm/px · 3 of 26 slices shown (3 of 3)]
[im 1/26]
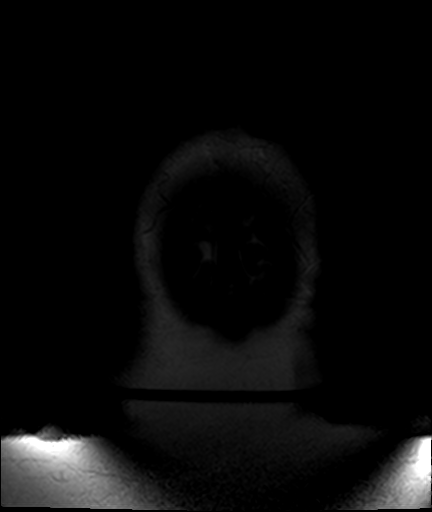
[im 13/26]
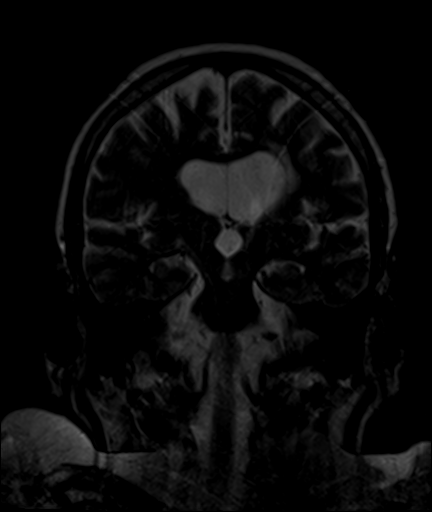
[im 26/26]
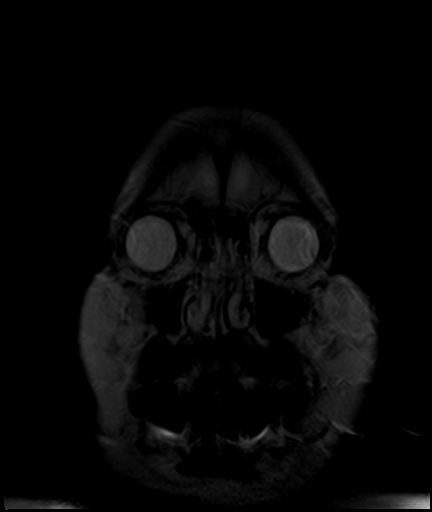

[31 of 48 positions shown; findings below may reference images not displayed]

FINDINGS: MRI HEAD FINDINGS

Brain: Exam is motion degraded.

No acute infarct or intracranial hemorrhage.

Remote left basal ganglia/corona radiata infarct with chronically
dilated left lateral ventricle and wallerian degeneration.

Remote right basal ganglia/posterior limb right internal capsule
infarct.

Global atrophy with ventricular prominence felt be related to the
atrophy and prior infarct rather than hydrocephalus and unchanged.

No intracranial mass lesion noted on this unenhanced exam.

Increased signal in the pituitary region on diffusion sequence
unchanged from prior exams and of indeterminate
significance/etiology.

Vascular: Major intracranial vascular structures are patent.

Skull and upper cervical spine: No acute abnormality.

Sinuses/Orbits: Post lens replacement without acute orbital
abnormality. Minimal mucosal thickening paranasal sinuses.

Other: Negative

MRA HEAD FINDINGS

Motion degradation limits evaluation for grading stenosis accurately
or detecting aneurysm.

Flow noted within the distal vertebral arteries and basilar artery.

Findings suggest significant atherosclerotic changes of the
posterior cerebral arteries and superior cerebral arteries greater
on the right.

Nonvisualized anterior inferior cerebellar arteries and poor
delineation of majority of the posterior inferior cerebellar artery
bilaterally.

Anterior circulation with flow within major vessels. Limited for
evaluating for stenosis although prominent intracranial
atherosclerotic type changes suspected.
IMPRESSION: MRI HEAD

Exam is motion degraded.

No acute infarct or intracranial hemorrhage.

Remote left basal ganglia/corona radiata infarct with chronically
dilated left lateral ventricle and wallerian degeneration.

Remote right basal ganglia/posterior limb right internal capsule
infarct.

Global atrophy with ventricular prominence felt be related to the
atrophy and prior infarct rather than hydrocephalus and unchanged.

MRA HEAD

Motion degradation limits evaluation for grading stenosis accurately
or detecting aneurysm.

Flow noted within the distal vertebral arteries and basilar artery.

Findings suggest significant atherosclerotic changes of the
posterior cerebral arteries and superior cerebral arteries greater
on the right.

Nonvisualized anterior inferior cerebellar arteries and poor
delineation of majority of the posterior inferior cerebellar artery
bilaterally.

Anterior circulation with flow within major vessels. Limited for
evaluating for stenosis although prominent intracranial
atherosclerotic type changes suspected.

## 2018-05-07 MED ORDER — HYDROCODONE-ACETAMINOPHEN 10-325 MG PO TABS: 1.0000 | ORAL_TABLET | Freq: Four times a day (QID) | ORAL | 0 refills | Status: DC | PRN

## 2018-05-07 NOTE — Telephone Encounter (Signed)
Raquel Sarna with [facility] called saying she wanted to extend her PT but she can not get in touch with the patient.  She has made several attempts. She is going to discontinue PT services for right now..  Thanks  teri

## 2018-05-09 ENCOUNTER — Other Ambulatory Visit: Payer: Self-pay

## 2018-05-09 NOTE — Telephone Encounter (Signed)
ok 

## 2018-05-16 DIAGNOSIS — M5416 Radiculopathy, lumbar region: Secondary | ICD-10-CM | POA: Diagnosis not present

## 2018-05-16 DIAGNOSIS — M5136 Other intervertebral disc degeneration, lumbar region: Secondary | ICD-10-CM | POA: Diagnosis not present

## 2018-05-16 DIAGNOSIS — M48062 Spinal stenosis, lumbar region with neurogenic claudication: Secondary | ICD-10-CM | POA: Diagnosis not present

## 2018-05-22 ENCOUNTER — Other Ambulatory Visit: Payer: Self-pay | Admitting: Family Medicine

## 2018-05-24 IMAGING — CR DG ABDOMEN 1V
1 series · 2 of 2 positions shown · non-contrast
Comparison: 05/16/2016.

CLINICAL DATA: Nausea.  Diarrhea .

EXAM:
ABDOMEN - 1 VIEW

[Series 1: dg abd 1 view · 0.14mm/px · 2 of 2 slices shown]
[im 1/2]
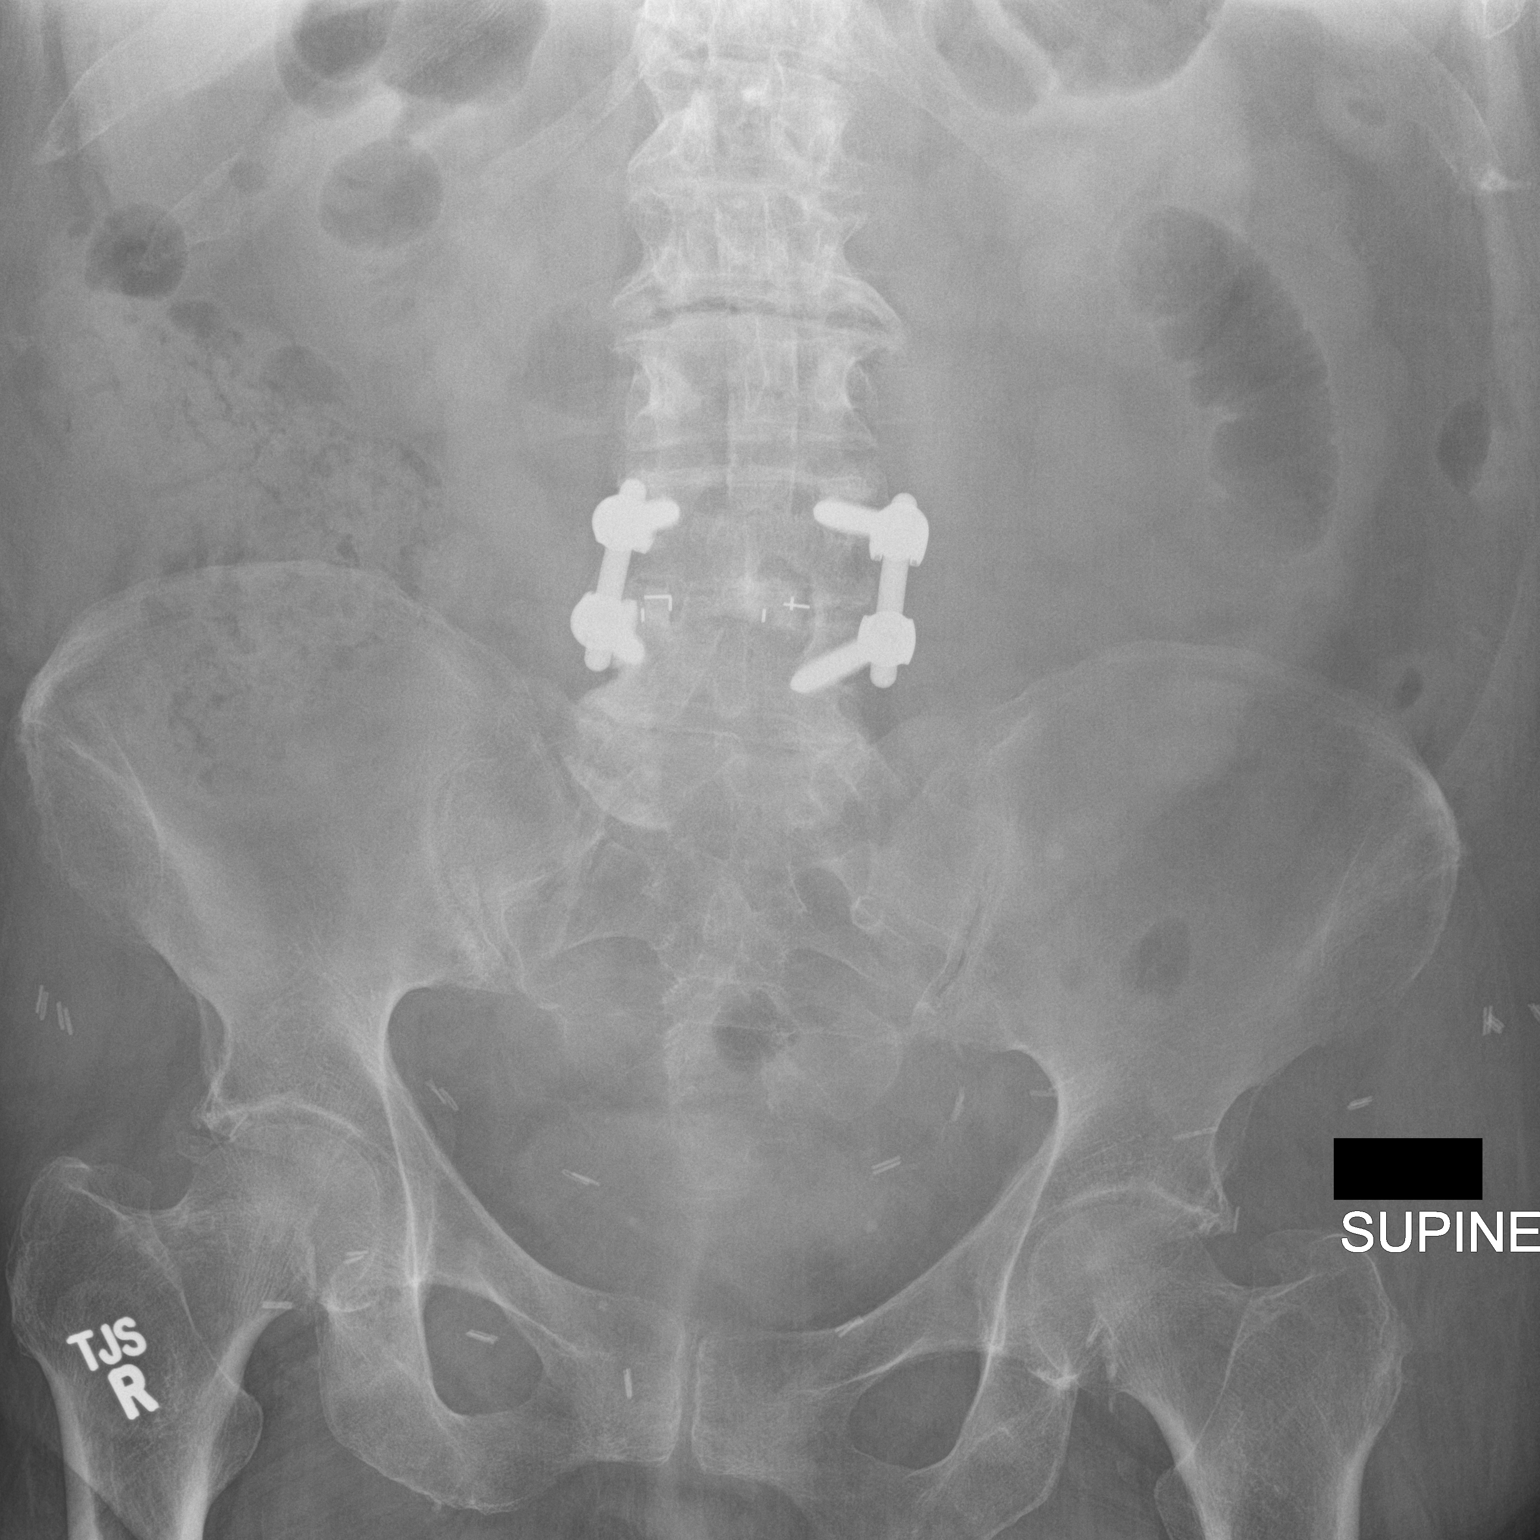
[im 2/2]
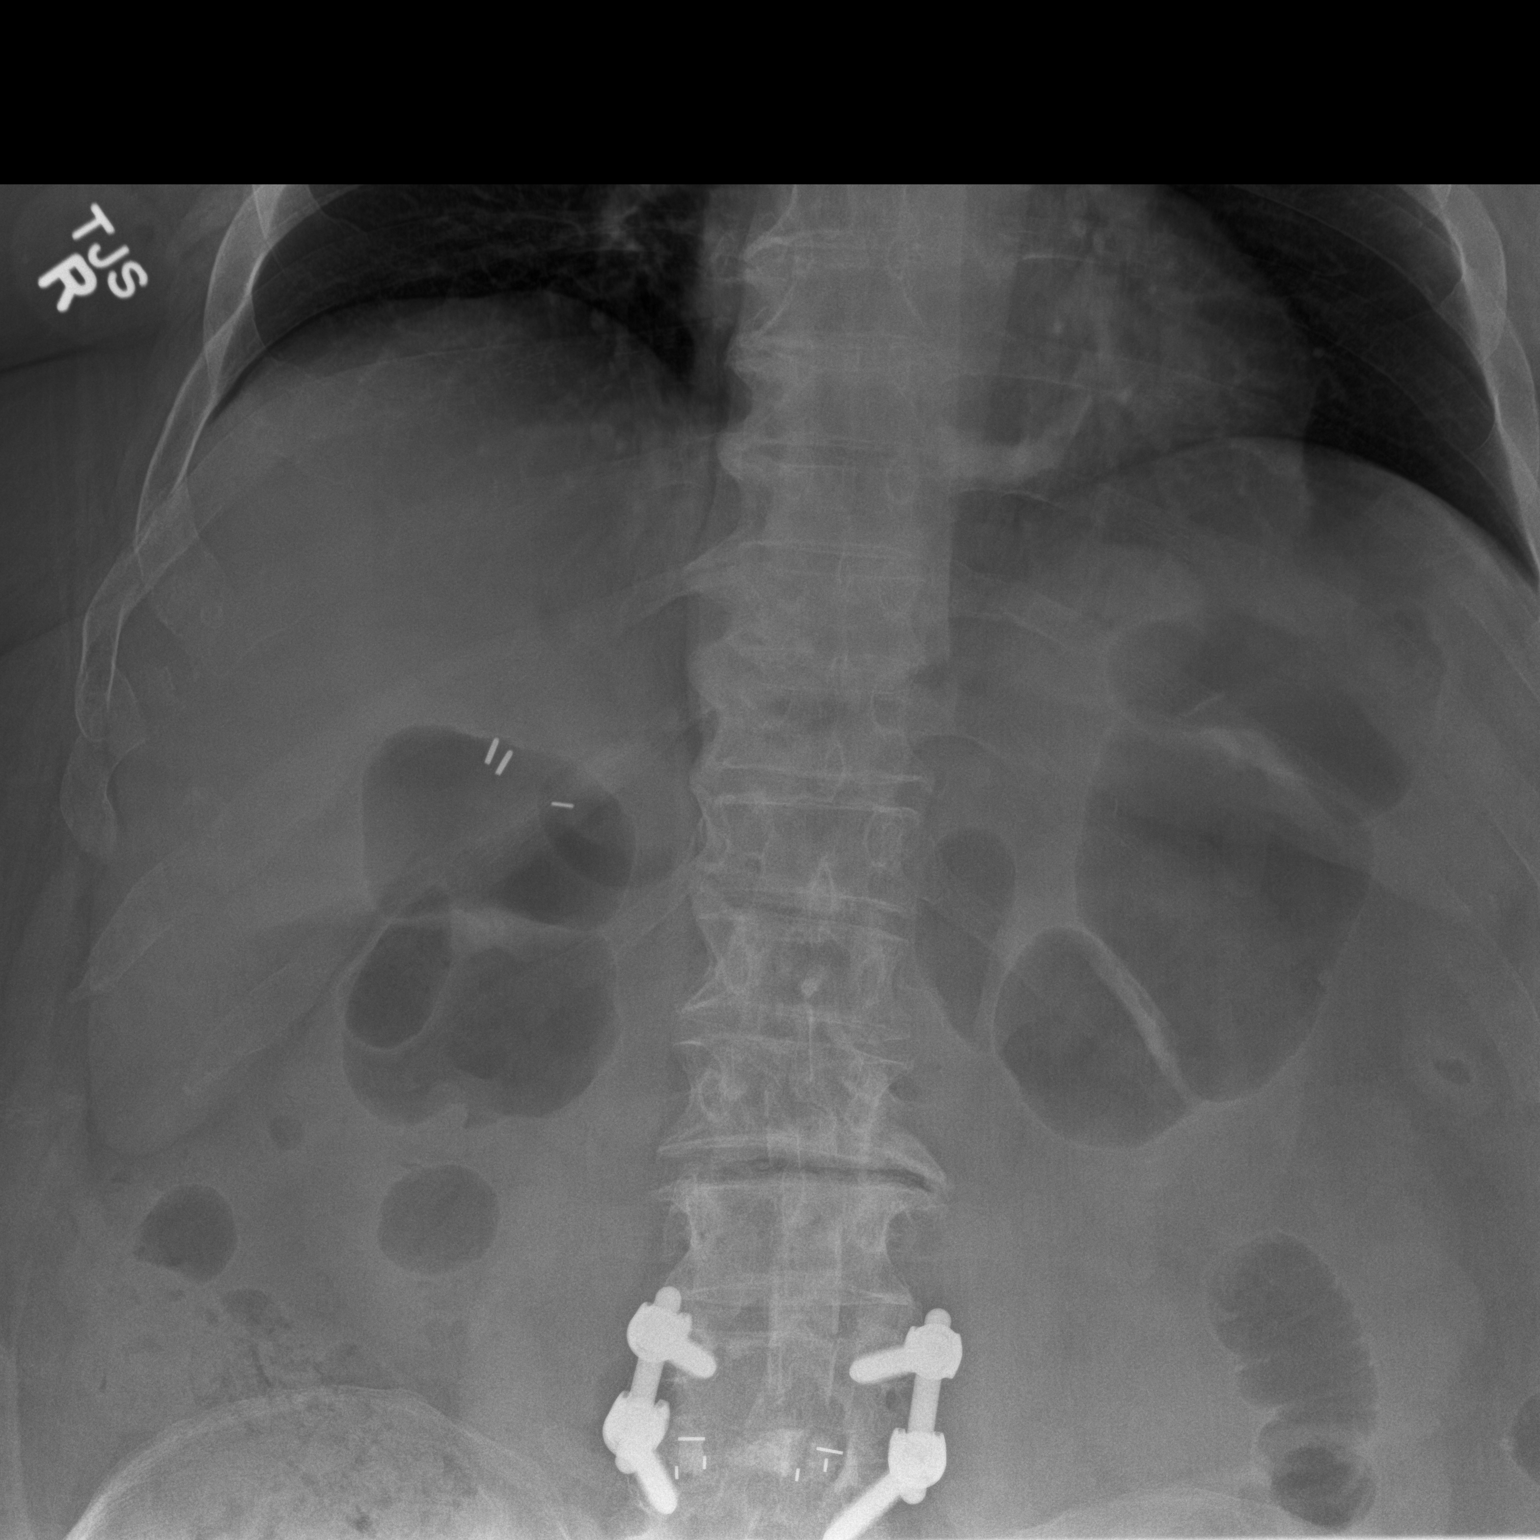

[2 of 2 positions shown; findings below may reference images not displayed]

FINDINGS: Soft tissue structures are unremarkable. Nonspecific air-filled
loops of small bowel noted. Colonic gas pattern is unremarkable .
Clips right upper quadrant. Aortoiliac atherosclerotic vascular
calcification. Prior lumbar spine fusion. Surgical clips in the
pelvis .
IMPRESSION: 1. Nonspecific air-filled loop of small bowel noted. Colonic gas
pattern is unremarkable. No free air.

2. Aortoiliac atherosclerotic vascular disease .

## 2018-06-04 ENCOUNTER — Ambulatory Visit: Payer: Self-pay | Admitting: Family Medicine

## 2018-06-17 ENCOUNTER — Other Ambulatory Visit: Payer: Self-pay | Admitting: Family Medicine

## 2018-06-17 DIAGNOSIS — K219 Gastro-esophageal reflux disease without esophagitis: Secondary | ICD-10-CM

## 2018-06-17 DIAGNOSIS — I10 Essential (primary) hypertension: Secondary | ICD-10-CM

## 2018-06-20 DIAGNOSIS — M5136 Other intervertebral disc degeneration, lumbar region: Secondary | ICD-10-CM | POA: Diagnosis not present

## 2018-06-20 DIAGNOSIS — M48062 Spinal stenosis, lumbar region with neurogenic claudication: Secondary | ICD-10-CM | POA: Diagnosis not present

## 2018-06-20 DIAGNOSIS — M5416 Radiculopathy, lumbar region: Secondary | ICD-10-CM | POA: Diagnosis not present

## 2018-06-26 ENCOUNTER — Emergency Department
Admission: EM | Admit: 2018-06-26 | Discharge: 2018-06-26 | Disposition: A | Discharge status: Home / Self Care | Payer: PPO | Attending: Emergency Medicine | Admitting: Emergency Medicine

## 2018-06-26 ENCOUNTER — Other Ambulatory Visit: Payer: Self-pay

## 2018-06-26 ENCOUNTER — Emergency Department: Payer: PPO

## 2018-06-26 ENCOUNTER — Other Ambulatory Visit: Payer: Self-pay | Admitting: Family Medicine

## 2018-06-26 ENCOUNTER — Encounter: Payer: Self-pay | Admitting: Emergency Medicine

## 2018-06-26 DIAGNOSIS — E039 Hypothyroidism, unspecified: Secondary | ICD-10-CM | POA: Insufficient documentation

## 2018-06-26 DIAGNOSIS — E1122 Type 2 diabetes mellitus with diabetic chronic kidney disease: Secondary | ICD-10-CM | POA: Insufficient documentation

## 2018-06-26 DIAGNOSIS — R0602 Shortness of breath: Secondary | ICD-10-CM | POA: Diagnosis not present

## 2018-06-26 DIAGNOSIS — N183 Chronic kidney disease, stage 3 (moderate): Secondary | ICD-10-CM | POA: Diagnosis not present

## 2018-06-26 DIAGNOSIS — F329 Major depressive disorder, single episode, unspecified: Secondary | ICD-10-CM | POA: Insufficient documentation

## 2018-06-26 DIAGNOSIS — Z8673 Personal history of transient ischemic attack (TIA), and cerebral infarction without residual deficits: Secondary | ICD-10-CM | POA: Insufficient documentation

## 2018-06-26 DIAGNOSIS — Z794 Long term (current) use of insulin: Secondary | ICD-10-CM | POA: Diagnosis not present

## 2018-06-26 DIAGNOSIS — J40 Bronchitis, not specified as acute or chronic: Secondary | ICD-10-CM | POA: Insufficient documentation

## 2018-06-26 DIAGNOSIS — Z9049 Acquired absence of other specified parts of digestive tract: Secondary | ICD-10-CM | POA: Insufficient documentation

## 2018-06-26 DIAGNOSIS — R05 Cough: Secondary | ICD-10-CM | POA: Diagnosis not present

## 2018-06-26 DIAGNOSIS — Z79899 Other long term (current) drug therapy: Secondary | ICD-10-CM | POA: Diagnosis not present

## 2018-06-26 DIAGNOSIS — Z7902 Long term (current) use of antithrombotics/antiplatelets: Secondary | ICD-10-CM | POA: Insufficient documentation

## 2018-06-26 DIAGNOSIS — I129 Hypertensive chronic kidney disease with stage 1 through stage 4 chronic kidney disease, or unspecified chronic kidney disease: Secondary | ICD-10-CM | POA: Insufficient documentation

## 2018-06-26 DIAGNOSIS — E118 Type 2 diabetes mellitus with unspecified complications: Secondary | ICD-10-CM

## 2018-06-26 LAB — CBC WITH DIFFERENTIAL/PLATELET
Abs Immature Granulocytes: 0.06 10*3/uL (ref 0.00–0.07)
Basophils Absolute: 0.1 10*3/uL (ref 0.0–0.1)
Basophils Relative: 1 %
Eosinophils Absolute: 0.1 10*3/uL (ref 0.0–0.5)
Eosinophils Relative: 1 %
HCT: 39.7 % (ref 36.0–46.0)
Hemoglobin: 13 g/dL (ref 12.0–15.0)
Immature Granulocytes: 1 %
Lymphocytes Relative: 22 %
Lymphs Abs: 2.1 10*3/uL (ref 0.7–4.0)
MCH: 28.1 pg (ref 26.0–34.0)
MCHC: 32.7 g/dL (ref 30.0–36.0)
MCV: 85.7 fL (ref 80.0–100.0)
Monocytes Absolute: 0.9 10*3/uL (ref 0.1–1.0)
Monocytes Relative: 9 %
NEUTROS ABS: 6.3 10*3/uL (ref 1.7–7.7)
NEUTROS PCT: 66 %
Platelets: 283 10*3/uL (ref 150–400)
RBC: 4.63 MIL/uL (ref 3.87–5.11)
RDW: 13 % (ref 11.5–15.5)
WBC: 9.4 10*3/uL (ref 4.0–10.5)
nRBC: 0 % (ref 0.0–0.2)

## 2018-06-26 LAB — COMPREHENSIVE METABOLIC PANEL
ALT: 16 U/L (ref 0–44)
AST: 26 U/L (ref 15–41)
Albumin: 3.9 g/dL (ref 3.5–5.0)
Alkaline Phosphatase: 80 U/L (ref 38–126)
Anion gap: 13 (ref 5–15)
BUN: 22 mg/dL (ref 8–23)
CO2: 21 mmol/L — ABNORMAL LOW (ref 22–32)
Calcium: 8.4 mg/dL — ABNORMAL LOW (ref 8.9–10.3)
Chloride: 99 mmol/L (ref 98–111)
Creatinine, Ser: 1.8 mg/dL — ABNORMAL HIGH (ref 0.44–1.00)
GFR calc Af Amer: 34 mL/min — ABNORMAL LOW (ref >60)
GFR calc non Af Amer: 30 mL/min — ABNORMAL LOW (ref >60)
Glucose, Bld: 490 mg/dL — ABNORMAL HIGH (ref 70–99)
Potassium: 3.6 mmol/L (ref 3.5–5.1)
Sodium: 133 mmol/L — ABNORMAL LOW (ref 135–145)
Total Bilirubin: 0.7 mg/dL (ref 0.3–1.2)
Total Protein: 7 g/dL (ref 6.5–8.1)

## 2018-06-26 LAB — TROPONIN I: Troponin I: <0.03 ng/mL (ref <0.03)

## 2018-06-26 MED ORDER — BENZONATATE 100 MG PO CAPS: 100.0000 mg | ORAL_CAPSULE | Freq: Three times a day (TID) | ORAL | 0 refills | Status: DC | PRN

## 2018-06-26 MED ORDER — AZITHROMYCIN 500 MG PO TABS
500.0000 mg | ORAL_TABLET | Freq: Once | ORAL | Status: AC
  Administered 2018-06-26: 500 mg via ORAL
  Filled 2018-06-26: qty 1

## 2018-06-26 MED ORDER — AZITHROMYCIN 500 MG PO TABS: 500.0000 mg | ORAL_TABLET | Freq: Every day | ORAL | 0 refills | Status: DC

## 2018-06-26 MED ORDER — AZITHROMYCIN 500 MG PO TABS: 500.0000 mg | ORAL_TABLET | Freq: Every day | ORAL | 0 refills | Status: AC

## 2018-06-26 MED ORDER — BENZONATATE 100 MG PO CAPS
100.0000 mg | ORAL_CAPSULE | Freq: Once | ORAL | Status: AC
  Administered 2018-06-26: 100 mg via ORAL
  Filled 2018-06-26: qty 1

## 2018-06-26 NOTE — ED Triage Notes (Signed)
Pt to triage via w/c with no distress noted; pt reports today having SHOB and nonprod cough; st hx of same "long time ago" and dx with bronchitis and asthma; denies pain

## 2018-06-27 ENCOUNTER — Ambulatory Visit (INDEPENDENT_AMBULATORY_CARE_PROVIDER_SITE_OTHER): Payer: PPO | Admitting: Family Medicine

## 2018-06-27 VITALS — BP 124/78 | HR 94 | Temp 98.1°F | Resp 14

## 2018-06-27 DIAGNOSIS — R059 Cough, unspecified: Secondary | ICD-10-CM

## 2018-06-27 DIAGNOSIS — R05 Cough: Secondary | ICD-10-CM

## 2018-06-27 DIAGNOSIS — J209 Acute bronchitis, unspecified: Secondary | ICD-10-CM

## 2018-06-27 DIAGNOSIS — J440 Chronic obstructive pulmonary disease with acute lower respiratory infection: Secondary | ICD-10-CM

## 2018-06-27 DIAGNOSIS — Z6841 Body Mass Index (BMI) 40.0 and over, adult: Secondary | ICD-10-CM | POA: Diagnosis not present
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
## 2018-07-03 ENCOUNTER — Other Ambulatory Visit (INDEPENDENT_AMBULATORY_CARE_PROVIDER_SITE_OTHER): Payer: Self-pay | Admitting: Vascular Surgery

## 2018-07-03 DIAGNOSIS — I779 Disorder of arteries and arterioles, unspecified: Secondary | ICD-10-CM

## 2018-07-03 DIAGNOSIS — I739 Peripheral vascular disease, unspecified: Principal | ICD-10-CM

## 2018-07-04 ENCOUNTER — Ambulatory Visit: Payer: PPO | Admitting: Neurology

## 2018-07-04 ENCOUNTER — Ambulatory Visit: Payer: Self-pay | Admitting: Family Medicine

## 2018-07-05 ENCOUNTER — Encounter (INDEPENDENT_AMBULATORY_CARE_PROVIDER_SITE_OTHER): Payer: Self-pay | Admitting: Vascular Surgery

## 2018-07-05 ENCOUNTER — Ambulatory Visit (INDEPENDENT_AMBULATORY_CARE_PROVIDER_SITE_OTHER): Payer: PPO

## 2018-07-05 ENCOUNTER — Ambulatory Visit (INDEPENDENT_AMBULATORY_CARE_PROVIDER_SITE_OTHER): Payer: PPO | Admitting: Vascular Surgery

## 2018-07-05 VITALS — BP 119/48 | HR 59 | Resp 20 | Ht 61.0 in | Wt 228.0 lb

## 2018-07-05 DIAGNOSIS — I10 Essential (primary) hypertension: Secondary | ICD-10-CM | POA: Diagnosis not present

## 2018-07-05 DIAGNOSIS — I63412 Cerebral infarction due to embolism of left middle cerebral artery: Secondary | ICD-10-CM

## 2018-07-05 DIAGNOSIS — I6523 Occlusion and stenosis of bilateral carotid arteries: Secondary | ICD-10-CM | POA: Diagnosis not present

## 2018-07-05 DIAGNOSIS — E118 Type 2 diabetes mellitus with unspecified complications: Secondary | ICD-10-CM | POA: Diagnosis not present

## 2018-07-05 DIAGNOSIS — E785 Hyperlipidemia, unspecified: Secondary | ICD-10-CM

## 2018-07-05 DIAGNOSIS — I779 Disorder of arteries and arterioles, unspecified: Secondary | ICD-10-CM

## 2018-07-05 DIAGNOSIS — I739 Peripheral vascular disease, unspecified: Secondary | ICD-10-CM | POA: Diagnosis not present

## 2018-07-05 NOTE — Progress Notes (Signed)
MRN : [id number]  Susan House is a 62 y.o. (06-19-1956) female who presents with chief complaint of  Chief Complaint  Patient presents with  . Follow-up  .  History of Present Illness: Patient returns in follow-up.  She continues to be somewhat debilitated but has not had any new events since her hospitalization and our consult several months ago. Carotid duplex today reveals velocities consistent with 40 to 59% right ICA stenosis and 1 to 39% left ICA stenosis.   Current Outpatient Medications  Medication Sig Dispense Refill  . ACCU-CHEK SOFTCLIX LANCETS lancets CHECK BLOOD SUGAR THREE TIMES DAILY 300 each 3  . amLODipine (NORVASC) 10 MG tablet Take 1 tablet by mouth daily 90 tablet 2  . atorvastatin (LIPITOR) 20 MG tablet Take 20 mg by mouth daily.    . benzonatate (TESSALON PERLES) 100 MG capsule Take 1 capsule (100 mg total) by mouth 3 (three) times daily as needed for cough. 30 capsule 0  . Blood Glucose Monitoring Suppl (ACCU-CHEK AVIVA PLUS) w/Device KIT Check sugar three times daily DX E11.9-needs a meter 1 kit 0  . cloNIDine (CATAPRES) 0.3 MG tablet Take 1 tablet (0.3 mg total) by mouth 2 (two) times daily. 60 tablet 12  . clopidogrel (PLAVIX) 75 MG tablet Take 1 tablet (75 mg total) by mouth daily. 90 tablet 3  . Continuous Blood Gluc Receiver (FREESTYLE LIBRE 14 DAY READER) DEVI LENGTH OF NEED: LIFETIME - UNLESS SPECIFIED OTHERWISE 2 Device 11  . Continuous Blood Gluc Sensor (FREESTYLE LIBRE 14 DAY SENSOR) MISC LENGTH OF NEED: LIFETIME - UNLESS SPECIFIED OTHERWISE 2 each 0  . diphenoxylate-atropine (LOMOTIL) 2.5-0.025 MG tablet 2 tablets initially and then 1 tablet every 6 hours as needed for diarrhea 60 tablet 1  . enalapril (VASOTEC) 20 MG tablet Take 1 tablet (20 mg total) by mouth 2 (two) times daily. 180 tablet 2  . glucose blood (ACCU-CHEK AVIVA PLUS) test strip TEST BLOOD SUGAR THREE TIMES DAILY 300 each 3  . hydrALAZINE (APRESOLINE) 100 MG tablet Take 1 tablet  (100 mg total) by mouth 2 (two) times daily. 60 tablet 11  . HYDROcodone-acetaminophen (NORCO) 10-325 MG tablet Take 1-2 tablets by mouth every 6 (six) hours as needed. 100 tablet 0  . insulin aspart protamine- aspart (NOVOLOG MIX 70/30) (70-30) 100 UNIT/ML injection Inject 10-35 Units into the skin 2 (two) times daily.     [employer] Devices (ACCU-CHEK SOFTCLIX) lancets Check sugar three times daily DX E11.9 100 each 3  . levETIRAcetam (KEPPRA) 500 MG tablet Take 1 tablet (500 mg total) by mouth 2 (two) times daily. 500 mg bid. 180 tablet 3  . metFORMIN (GLUCOPHAGE) 1000 MG tablet Take 1 tablet (1,000 mg total) by mouth 2 (two) times daily. 180 tablet 3  . metoprolol succinate (TOPROL-XL) 100 MG 24 hr tablet Take 1 tablet (100 mg total) by mouth daily. 90 tablet 3  . nitroGLYCERIN (NITROSTAT) 0.4 MG SL tablet Place 1 tablet (0.4 mg total) under the tongue every 5 (five) minutes as needed for chest pain. 25 tablet 0  . oxybutynin (DITROPAN) 5 MG tablet Take 1 tablet (5 mg total) by mouth 2 (two) times daily. 180 tablet 3  . pantoprazole (PROTONIX) 40 MG tablet Take 1 tablet by mouth daily 90 tablet 2  . meclizine (ANTIVERT) 25 MG tablet Take 1 tablet (25 mg total) by mouth 3 (three) times daily as needed for dizziness. (Patient not taking: Reported on 07/05/2018) 30 tablet 0  .  metFORMIN (GLUCOPHAGE) 1000 MG tablet Take 1 tablet (1,000 mg total) by mouth 2 (two) times daily. (Patient not taking: Reported on 07/05/2018) 180 tablet 2  . mupirocin cream (BACTROBAN) 2 % Apply 1 application topically daily. (Patient not taking: Reported on 07/05/2018) 15 g 0  . nystatin cream (MYCOSTATIN) Apply 1 application topically 2 (two) times daily. (Patient not taking: Reported on 07/05/2018) 30 g 0  . prochlorperazine (COMPAZINE) 5 MG tablet Take 1 tablet (5 mg total) by mouth every 6 (six) hours as needed for nausea or vomiting. (Patient not taking: Reported on 07/05/2018) 20 tablet 0  . triamcinolone cream  (KENALOG) 0.1 % Can use thin layer on affected areas 2x daily for one week at a time. Not for face. (Patient not taking: Reported on 07/05/2018) 30 g 0   No current facility-administered medications for this visit.     Past Medical History:  Diagnosis Date  . Allergy   . Arthritis   . Cellulitis and abscess of face   . Depression   . Diabetes mellitus ([place])   . Edema   . GERD (gastroesophageal reflux disease)   . Hematuria   . History of echocardiogram    2008: normal, 2011: normal LVSF  . History of nuclear stress test    a. 12/2009: lexiscan - negative  . Hyperlipidemia   . Hypertension   . IBS (irritable bowel syndrome)   . Migraine   . Morbid obesity ([place])   . Nocturia   . Stroke ([place]) 2000  . TIA (transient ischemic attack) 2010  . Urgency of micturation   . Urinary frequency   . Urinary incontinence     Past Surgical History:  Procedure Laterality Date  . ABDOMINAL HYSTERECTOMY    . ABDOMINOPLASTY    . APPENDECTOMY    . BACK SURGERY     extensive spinal fusion  . carpel tunnel surgery    . CATARACT EXTRACTION     x 2  . CESAREAN SECTION    . CHOLECYSTECTOMY    . COLONOSCOPY  2010  . COLONOSCOPY WITH PROPOFOL N/A 07/11/2016   Procedure: COLONOSCOPY WITH PROPOFOL;  Surgeon: Lucilla Lame, MD;  Location: [facility] ENDOSCOPY;  Service: Endoscopy;  Laterality: N/A;  . COLONOSCOPY WITH PROPOFOL N/A 03/26/2018   Procedure: COLONOSCOPY WITH PROPOFOL;  Surgeon: Lucilla Lame, MD;  Location: [facility] ENDOSCOPY;  Service: Endoscopy;  Laterality: N/A;  . ESOPHAGOGASTRODUODENOSCOPY (EGD) WITH PROPOFOL N/A 03/26/2018   Procedure: ESOPHAGOGASTRODUODENOSCOPY (EGD) WITH PROPOFOL;  Surgeon: Lucilla Lame, MD;  Location: [facility] ENDOSCOPY;  Service: Endoscopy;  Laterality: N/A;  . EYE SURGERY    . HERNIA REPAIR    . SHOULDER SURGERY    . TOE SURGERY    . UPPER GI ENDOSCOPY    . WRIST FRACTURE SURGERY Left     Social History Social History   Tobacco Use  . Smoking status: Never Smoker    . Smokeless tobacco: Never Used  Substance Use Topics  . Alcohol use: No  . Drug use: No    Family History Family History  Problem Relation Age of Onset  . Hyperlipidemia Mother   . Arrhythmia Mother        WPW  . Hypertension Mother   . Rheum arthritis Mother   . Heart disease Mother   . Heart attack Father 96  . Hyperlipidemia Father   . Hypertension Father   . Stroke Father   . Diabetes Father   . Heart disease Father   . Coronary artery disease  Father   . Diabetes Sister   . Hyperlipidemia Sister   . Hypertension Sister   . Depression Sister   . Depression Sister   . Diabetes Sister   . Hypertension Sister   . Hyperlipidemia Sister   . Kidney disease Paternal Grandmother      Allergies  Allergen Reactions  . Morphine And Related Anaphylaxis  . Fentanyl Other (See Comments)    Unknown reaction.  . Reglan [Metoclopramide]     Other reaction(s): Unknown Elevated BP  . Simvastatin Other (See Comments)    Muscle pain  . Betadine [Povidone Iodine] Rash  . Iodine Rash    Other reaction(s): Unknown-not allergic to ct contrast-ars 04/01/18  . Tetracyclines & Related Rash    Other reaction(s): Unknown     REVIEW OF SYSTEMS (Negative unless checked)  Constitutional: [] Weight loss  [] Fever  [] Chills Cardiac: [] Chest pain   [] Chest pressure   [] Palpitations   [] Shortness of breath when laying flat   [] Shortness of breath at rest   [x] Shortness of breath with exertion. Vascular:  [] Pain in legs with walking   [] Pain in legs at rest   [] Pain in legs when laying flat   [] Claudication   [] Pain in feet when walking  [] Pain in feet at rest  [] Pain in feet when laying flat   [] History of DVT   [] Phlebitis   [] Swelling in legs   [] Varicose veins   [] Non-healing ulcers Pulmonary:   [] Uses home oxygen   [] Productive cough   [] Hemoptysis   [] Wheeze  [] COPD   [] Asthma Neurologic:  [] Dizziness  [] Blackouts   [] Seizures   [x] History of stroke   [x] History of TIA  [x] Aphasia    [] Temporary blindness   [] Dysphagia   [x] Weakness or numbness in arms   [x] Weakness or numbness in legs Musculoskeletal:  [x] Arthritis   [] Joint swelling   [x] Joint pain   [x] Low back pain Hematologic:  [] Easy bruising  [] Easy bleeding   [] Hypercoagulable state   [] Anemic  [] Hepatitis Gastrointestinal:  [] Blood in stool   [] Vomiting blood  [] Gastroesophageal reflux/heartburn   [] Difficulty swallowing. Genitourinary:  [x] Chronic kidney disease   [] Difficult urination  [] Frequent urination  [] Burning with urination   [] Blood in urine Skin:  [] Rashes   [] Ulcers   [] Wounds Psychological:  [] History of anxiety   []  History of major depression.  Physical Examination  Vitals:   07/05/18 1519  BP: (!) 119/48  Pulse: (!) 59  Resp: 20  Weight: 228 lb (103.4 kg)  Height: 5' 1"  (1.549 m)   Body mass index is 43.08 kg/m. Gen: Obese, NAD.  Appears older than stated age Head: [place]/AT, No temporalis wasting. Ear/Nose/Throat: Hearing grossly intact, nares w/o erythema or drainage, trachea midline Eyes: Conjunctiva clear. Sclera non-icteric Neck: Supple.  Soft right carotid bruit  Pulmonary:  Good air movement, equal and clear to auscultation bilaterally.  Cardiac: RRR, No JVD Vascular:  Vessel Right Left  Radial Palpable Palpable                   Musculoskeletal: Wheelchair.  No deformity or atrophy.  Neurologic:Sensation grossly intact in extremities.  Speech is somewhat labored. Psychiatric: Judgment intact, Mood & affect appropriate for pt's clinical situation. Dermatologic: No rashes or ulcers noted.  No cellulitis or open wounds.      CBC Lab Results  Component Value Date   WBC 9.4 06/26/2018   HGB 13.0 06/26/2018   HCT 39.7 06/26/2018   MCV 85.7 06/26/2018   PLT 283 06/26/2018  BMET    Component Value Date/Time   NA 133 (L) 06/26/2018 0111   NA 139 12/25/2017 1600   NA 140 06/17/2014 0744   K 3.6 06/26/2018 0111   K 4.1 06/17/2014 0744   CL 99 06/26/2018 0111   CL  105 06/17/2014 0744   CO2 21 (L) 06/26/2018 0111   CO2 27 06/17/2014 0744   GLUCOSE 490 (H) 06/26/2018 0111   GLUCOSE 116 (H) 06/17/2014 0744   BUN 22 06/26/2018 0111   BUN 14 12/25/2017 1600   BUN 35 (H) 06/17/2014 0744   CREATININE 1.80 (H) 06/26/2018 0111   CREATININE 1.29 06/17/2014 0744   CALCIUM 8.4 (L) 06/26/2018 0111   CALCIUM 8.6 06/17/2014 0744   GFRNONAA 30 (L) 06/26/2018 0111   GFRNONAA 45 (L) 06/17/2014 0744   GFRNONAA 47 (L) 01/18/2014 0059   GFRAA 34 (L) 06/26/2018 0111   GFRAA 55 (L) 06/17/2014 0744   GFRAA 55 (L) 01/18/2014 0059   Estimated Creatinine Clearance: 35.8 mL/min (A) (by C-G formula based on SCr of 1.8 mg/dL (H)).  COAG Lab Results  Component Value Date   INR 1.02 03/31/2018   INR 0.95 10/03/2017   INR 0.99 12/26/2015    Radiology Dg Chest 2 View  Result Date: 06/26/2018 CLINICAL DATA:  62 year old female with shortness of breath. EXAM: CHEST - 2 VIEW COMPARISON:  Chest radiograph dated 11/22/2016. FINDINGS: Shallow inspiration. No focal consolidation, pleural effusion, or pneumothorax. The cardiac silhouette is within normal limits. Old right clavicular fracture. No acute osseous pathology. IMPRESSION: No active cardiopulmonary disease. Electronically Signed   By: Anner Crete M.D.   On: 06/26/2018 01:42     Assessment/Plan Essential hypertension blood pressure control important in reducing the progression of atherosclerotic disease. On appropriate oral medications.   Type 2 diabetes mellitus with complications blood glucose control important in reducing the progression of atherosclerotic disease. Also, involved in wound healing. On appropriate medications.   Hyperlipidemia lipid control important in reducing the progression of atherosclerotic disease. Continue statin therapy   Carotid stenosis Carotid duplex today reveals velocities consistent with 40 to 59% right ICA stenosis and 1 to 39% left ICA stenosis.  No role for  intervention at this level.  Continue current regimen medically.  Recheck in 6 months with noninvasive studies.    Leotis Pain, MD  07/05/2018 4:07 PM    This note was created with Dragon medical transcription system.  Any errors from dictation are purely unintentional

## 2018-07-05 NOTE — Assessment & Plan Note (Signed)
lipid control important in reducing the progression of atherosclerotic disease. Continue statin therapy  

## 2018-07-05 NOTE — Assessment & Plan Note (Signed)
blood glucose control important in reducing the progression of atherosclerotic disease. Also, involved in wound healing. On appropriate medications.  

## 2018-07-05 NOTE — Assessment & Plan Note (Signed)
blood pressure control important in reducing the progression of atherosclerotic disease. On appropriate oral medications.  

## 2018-07-05 NOTE — Assessment & Plan Note (Signed)
Carotid duplex today reveals velocities consistent with 40 to 59% right ICA stenosis and 1 to 39% left ICA stenosis.  No role for intervention at this level.  Continue current regimen medically.  Recheck in 6 months with noninvasive studies.

## 2018-07-15 ENCOUNTER — Other Ambulatory Visit: Payer: Self-pay

## 2018-07-15 ENCOUNTER — Other Ambulatory Visit: Payer: Self-pay | Admitting: Family Medicine

## 2018-07-15 ENCOUNTER — Encounter: Payer: Self-pay | Admitting: Family Medicine

## 2018-07-15 ENCOUNTER — Ambulatory Visit (INDEPENDENT_AMBULATORY_CARE_PROVIDER_SITE_OTHER): Payer: PPO | Admitting: Family Medicine

## 2018-07-15 VITALS — BP 106/66 | HR 56 | Temp 97.4°F | Ht 61.0 in | Wt 232.0 lb

## 2018-07-15 DIAGNOSIS — M5416 Radiculopathy, lumbar region: Secondary | ICD-10-CM | POA: Diagnosis not present

## 2018-07-15 DIAGNOSIS — E0822 Diabetes mellitus due to underlying condition with diabetic chronic kidney disease: Secondary | ICD-10-CM

## 2018-07-15 DIAGNOSIS — N183 Chronic kidney disease, stage 3 unspecified: Secondary | ICD-10-CM

## 2018-07-15 MED ORDER — OXYCODONE HCL 5 MG PO TABS: 5.0000 mg | ORAL_TABLET | ORAL | 0 refills | Status: DC | PRN

## 2018-07-15 NOTE — Telephone Encounter (Signed)
[facility] faxed refill request for the following medications:  metFORMIN (GLUCOPHAGE) 1000 MG tablet  90 day supply  Please advise.

## 2018-07-15 NOTE — Patient Instructions (Signed)
Start prednisione and oxycodone as needed. Do f/u with Dr. Sharlet Salina as scheduled next month.

## 2018-07-15 NOTE — Progress Notes (Signed)
  Subjective:     Patient ID: Susan House, female   DOB: 02/29/1956, 62 y.o.   MRN: [id number] Chief Complaint  Patient presents with  . Back Pain    started at 5am today    HPI Reports an exacerbation of her chronic back pain.Denies falls and states she was just moving in the bed when the pain occurred which runs down the back of her left leg. Last had EPI 06/20/18. Reports Dr. Sharlet Salina called in a steroid pack for her. Would like to try oxycodone again as hydrocodone not easing the pain much. She is accompanied by her husband today.  Review of Systems     Objective:   Physical Exam Constitutional:      General: She is not in acute distress.    Appearance: She is not ill-appearing.  Musculoskeletal:     Comments: Moderately tender in her left lumbar paravertebral area. No rash noted  Neurological:     Mental Status: She is alert.        Assessment:    1. Left lumbar radiculitis - oxyCODONE (OXY IR/ROXICODONE) 5 MG immediate release tablet; Take 1 tablet (5 mg total) by mouth every 4 (four) hours as needed for severe pain.  Dispense: 30 tablet; Refill: 0    Plan:    Start prednisone and f/u with Dr. Sharlet Salina as scheduled.

## 2018-07-22 MED ORDER — METFORMIN HCL 1000 MG PO TABS: 1000.0000 mg | ORAL_TABLET | Freq: Two times a day (BID) | ORAL | 3 refills | Status: DC

## 2018-07-23 ENCOUNTER — Telehealth: Payer: Self-pay

## 2018-07-23 ENCOUNTER — Other Ambulatory Visit: Payer: Self-pay

## 2018-07-23 ENCOUNTER — Ambulatory Visit (INDEPENDENT_AMBULATORY_CARE_PROVIDER_SITE_OTHER): Payer: Medicare Other | Admitting: Family Medicine

## 2018-07-23 ENCOUNTER — Encounter: Payer: Self-pay | Admitting: Family Medicine

## 2018-07-23 VITALS — BP 98/50 | HR 57 | Temp 97.7°F | Wt 230.0 lb

## 2018-07-23 DIAGNOSIS — R739 Hyperglycemia, unspecified: Secondary | ICD-10-CM | POA: Diagnosis not present

## 2018-07-23 LAB — POCT URINALYSIS DIPSTICK
Bilirubin, UA: NEGATIVE
Blood, UA: NEGATIVE
Glucose, UA: POSITIVE — AB
Ketones, UA: NEGATIVE
Leukocytes, UA: NEGATIVE
Nitrite, UA: NEGATIVE
Protein, UA: POSITIVE — AB
Spec Grav, UA: 1.01 (ref 1.010–1.025)
Urobilinogen, UA: 0.2 E.U./dL
pH, UA: 5 (ref 5.0–8.0)

## 2018-07-23 NOTE — Telephone Encounter (Signed)
Patient called office requesting CMA give her a call back to discuss fluctuating blood sugar readings in the morning, patient request appointment with Dr. Rosanna Randy only to discuss. Earliest appointment I had was Thursday at 8:40. KW

## 2018-07-23 NOTE — Telephone Encounter (Signed)
Appt made with PA

## 2018-07-23 NOTE — Patient Instructions (Signed)
Continue to monitor blood sugars as you are doing as adjust insulin accordingly.

## 2018-07-23 NOTE — Telephone Encounter (Signed)
No openings until Thursday--maybe one of PAs.

## 2018-07-23 NOTE — Progress Notes (Signed)
  Subjective:     Patient ID: Susan House, female   DOB: 1955-08-08, 63 y.o.   MRN: [id number] Chief Complaint  Patient presents with  . elevated blood sugars  . possible UTI   HPI States she had a blood sugar reading in the 500's yesterday while completing a 6 day course of prednisone. She has adjusted her insulin accordingly and glucose has gone from the 300's this AM to 165 this PM. Wishes to make sure that she does not have an occult UTI. Accompanied by her husband today.  Review of Systems     Objective:   Physical Exam Constitutional:      General: She is not in acute distress.    Appearance: She is not ill-appearing.  Neurological:     Mental Status: She is alert.        Assessment:    1. Hyperglycemia: secondary to corticosteroids - POCT Urinalysis Dipstick    Plan:    She will continue to monitor insulin and adjust according to blood sugar readings.

## 2018-07-23 NOTE — Telephone Encounter (Signed)
Spoke with the patient, and she reports that her blood sugars have been fluctuating for the last 2-3 days. She reports that her BS yesterday went up to 501. She reports that she took 55units of Novolog, and it came down to around 480. She reports that this morning, her BS was at 398 (fasting). She has already taken 30 units of Novolog this morning. Patient feels that she may have an underlying infection that is causing her readings to be off. She denies any URI symptoms, but she reports that she does have history of recurrent UTIs, and feels it may be that. She denies hematuria or urinary frequency. Denies fevers or chills. Patient is willing to be seen, but wants to be seen by you. You do not have any openings until Thursday. Do you want to work the patient in? Please advise. Thanks!

## 2018-07-24 NOTE — Telephone Encounter (Signed)
Faxed printed Rx for metFORMIN (GLUCOPHAGE) 1000 MG tablet to [facility] Fax# [phone]. Thanks TNP

## 2018-07-25 ENCOUNTER — Ambulatory Visit: Payer: Self-pay | Admitting: Family Medicine

## 2018-07-29 ENCOUNTER — Ambulatory Visit (INDEPENDENT_AMBULATORY_CARE_PROVIDER_SITE_OTHER): Payer: Medicare Other | Admitting: Family Medicine

## 2018-07-29 ENCOUNTER — Ambulatory Visit: Payer: Self-pay | Admitting: Family Medicine

## 2018-07-29 ENCOUNTER — Telehealth: Payer: Self-pay | Admitting: Family Medicine

## 2018-07-29 VITALS — BP 112/70 | HR 73 | Temp 97.4°F | Resp 16

## 2018-07-29 DIAGNOSIS — I63419 Cerebral infarction due to embolism of unspecified middle cerebral artery: Secondary | ICD-10-CM

## 2018-07-29 DIAGNOSIS — L851 Acquired keratosis [keratoderma] palmaris et plantaris: Secondary | ICD-10-CM | POA: Diagnosis not present

## 2018-07-29 DIAGNOSIS — N183 Chronic kidney disease, stage 3 unspecified: Secondary | ICD-10-CM

## 2018-07-29 DIAGNOSIS — E0822 Diabetes mellitus due to underlying condition with diabetic chronic kidney disease: Secondary | ICD-10-CM

## 2018-07-29 DIAGNOSIS — I69354 Hemiplegia and hemiparesis following cerebral infarction affecting left non-dominant side: Secondary | ICD-10-CM

## 2018-07-29 DIAGNOSIS — R531 Weakness: Secondary | ICD-10-CM | POA: Diagnosis not present

## 2018-07-29 DIAGNOSIS — B351 Tinea unguium: Secondary | ICD-10-CM | POA: Diagnosis not present

## 2018-07-29 DIAGNOSIS — K219 Gastro-esophageal reflux disease without esophagitis: Secondary | ICD-10-CM

## 2018-07-29 DIAGNOSIS — E1140 Type 2 diabetes mellitus with diabetic neuropathy, unspecified: Secondary | ICD-10-CM | POA: Diagnosis not present

## 2018-07-29 DIAGNOSIS — R0789 Other chest pain: Secondary | ICD-10-CM

## 2018-07-29 DIAGNOSIS — Z6841 Body Mass Index (BMI) 40.0 and over, adult: Secondary | ICD-10-CM

## 2018-07-29 LAB — POCT GLYCOSYLATED HEMOGLOBIN (HGB A1C): Hemoglobin A1C: 12 % — AB (ref 4.0–5.6)

## 2018-07-29 MED ORDER — INSULIN DEGLUDEC 100 UNIT/ML ~~LOC~~ SOPN: PEN_INJECTOR | SUBCUTANEOUS | 11 refills | Status: DC

## 2018-07-29 NOTE — Patient Instructions (Addendum)
Discontinue Novolog 70/30.  Start Tresiba at 15 units daily. For every 2 day average of 200 or more, increase insulin by 2 units.

## 2018-07-29 NOTE — Telephone Encounter (Signed)
Diflucan 150 mg daily for 2 days.

## 2018-07-29 NOTE — Telephone Encounter (Signed)
Please review. Thanks!  
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
## 2018-07-29 NOTE — Telephone Encounter (Signed)
Pt's vaginal irritation medication wasn't called into:   [id number] Lorina Rabon, [place] [phone] (Phone) [phone] (Fax)   Please fill from today's visit with Rosanna Randy.  Thanks, [employer]

## 2018-07-30 MED ORDER — FLUCONAZOLE 150 MG PO TABS: 150.0000 mg | ORAL_TABLET | Freq: Every day | ORAL | 0 refills | Status: DC

## 2018-07-31 ENCOUNTER — Encounter: Payer: Self-pay | Admitting: Family Medicine

## 2018-07-31 ENCOUNTER — Other Ambulatory Visit: Payer: Self-pay

## 2018-07-31 ENCOUNTER — Ambulatory Visit (INDEPENDENT_AMBULATORY_CARE_PROVIDER_SITE_OTHER): Payer: Medicare Other | Admitting: Family Medicine

## 2018-07-31 VITALS — BP 146/70 | HR 76 | Temp 98.4°F | Ht 62.0 in | Wt 230.0 lb

## 2018-07-31 DIAGNOSIS — S0083XA Contusion of other part of head, initial encounter: Secondary | ICD-10-CM | POA: Diagnosis not present

## 2018-07-31 NOTE — Patient Instructions (Addendum)
Discussed use of cold compresses for 20 minutes several x day then change to warm compresses tomorrow. Let us know if new symptoms.

## 2018-07-31 NOTE — Progress Notes (Addendum)
  Subjective:     Patient ID: Susan House, female   DOB: 12/23/1955, 63 y.o.   MRN: [id number] Chief Complaint  Patient presents with  . Ear Pain    left ear started this morning 07/31/18   HPI Reports she fell two days ago and sustained bruising to the left side of her face. No recent URI or changes in hearing. Remains on Plavix. Accompanied by her husband today.  Review of Systems     Objective:   Physical Exam Constitutional:      General: She is not in acute distress.    Appearance: She is not ill-appearing.  HENT:     Right Ear: Tympanic membrane and ear canal normal.     Left Ear: Tympanic membrane and ear canal normal.  Eyes:     Extraocular Movements: Extraocular movements intact.  Musculoskeletal:     Comments: No TMJ tenderness and able to move her jaw well without discomfort.  Skin:    Comments: Small chin and left maxillary contusion noted. Mild tenderness over her left face without significant swellinng  Neurological:     Mental Status: She is alert.        Assessment:    1. Contusion of face, initial encounter     Plan:    Discussed cold compresses today then start warm compresses tomorrow. Further f/u if new symptoms. Discussed likelihood of dependent drift of bruising.

## 2018-08-05 DIAGNOSIS — M5136 Other intervertebral disc degeneration, lumbar region: Secondary | ICD-10-CM | POA: Diagnosis not present

## 2018-08-05 DIAGNOSIS — M5416 Radiculopathy, lumbar region: Secondary | ICD-10-CM | POA: Diagnosis not present

## 2018-08-05 DIAGNOSIS — M48062 Spinal stenosis, lumbar region with neurogenic claudication: Secondary | ICD-10-CM | POA: Diagnosis not present

## 2018-08-05 DIAGNOSIS — M47816 Spondylosis without myelopathy or radiculopathy, lumbar region: Secondary | ICD-10-CM | POA: Diagnosis not present

## 2018-08-13 ENCOUNTER — Other Ambulatory Visit: Payer: Self-pay | Admitting: Family Medicine

## 2018-08-13 DIAGNOSIS — K219 Gastro-esophageal reflux disease without esophagitis: Secondary | ICD-10-CM

## 2018-08-13 MED ORDER — METFORMIN HCL 1000 MG PO TABS: 1000.0000 mg | ORAL_TABLET | Freq: Two times a day (BID) | ORAL | 2 refills | Status: DC

## 2018-08-13 MED ORDER — PANTOPRAZOLE SODIUM 40 MG PO TBEC: 40.0000 mg | DELAYED_RELEASE_TABLET | Freq: Every day | ORAL | 2 refills | Status: DC

## 2018-08-13 MED ORDER — ATORVASTATIN CALCIUM 20 MG PO TABS: 20.0000 mg | ORAL_TABLET | Freq: Every day | ORAL | 3 refills | Status: DC

## 2018-08-13 NOTE — Telephone Encounter (Signed)
Please review. Thanks!  

## 2018-08-13 NOTE — Telephone Encounter (Signed)
Pt needing refills on:  metFORMIN (GLUCOPHAGE) 1000 MG tablet atorvastatin (LIPITOR) 20 MG tablet pantoprazole (PROTONIX) 40 MG tablet   Please call into:  [place], [place] [phone] (Phone) [phone] (Fax)     Thanks, [employer]
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
## 2018-08-14 IMAGING — CT CT HEAD W/O CM
3 series · 16 of 47 positions shown, 19 images · non-contrast
Comparison: Brain MRI 07/08/2016

CLINICAL DATA: Altered mental status.

EXAM:
CT HEAD WITHOUT CONTRAST
TECHNIQUE: Contiguous axial images were obtained from the base of the skull
through the vertex without intravenous contrast.

[Series 2: head wo · axial · 0.42mm/px · z∈[-226,-91]mm · 10 of 33 slices shown, 13 images]
[im 3/33  brain]
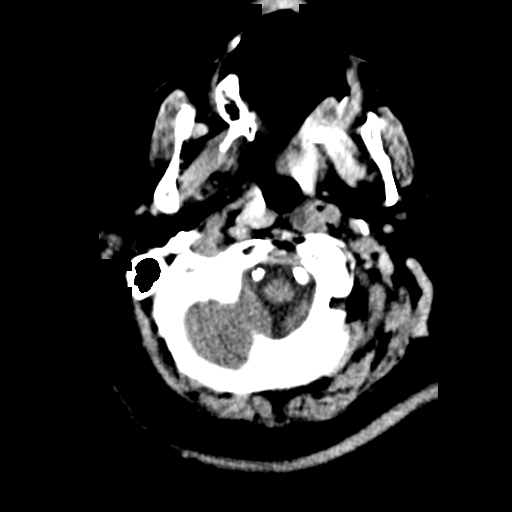
[im 3/33  bone]
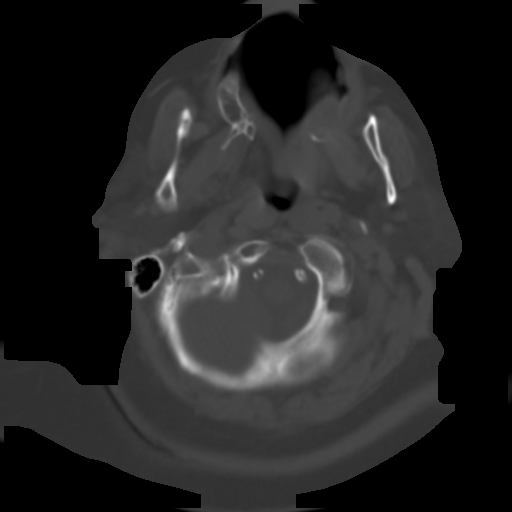
[im 6/33  brain]
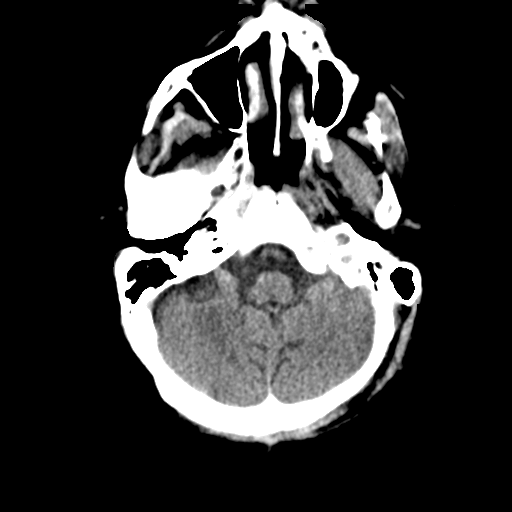
[im 9/33  brain]
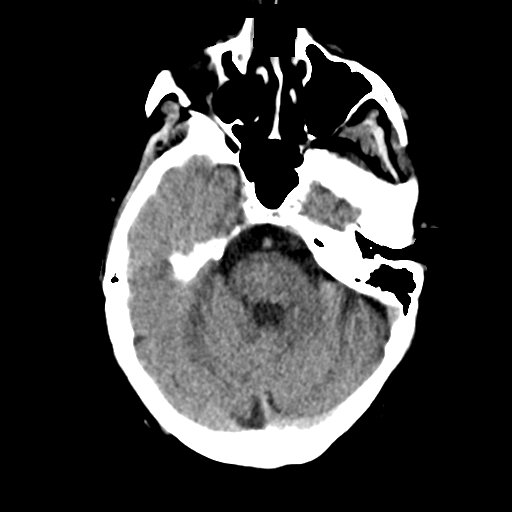
[im 12/33  brain]
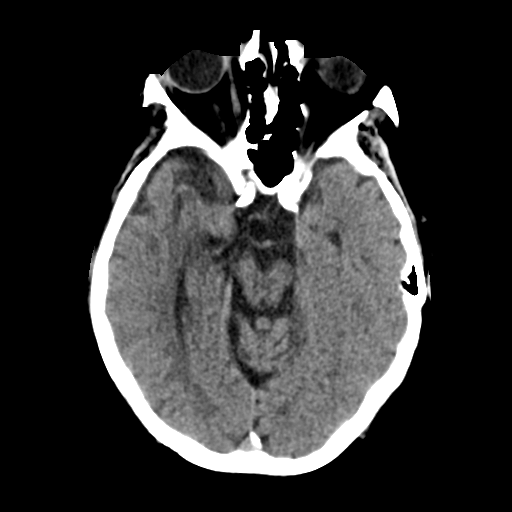
[im 15/33  brain]
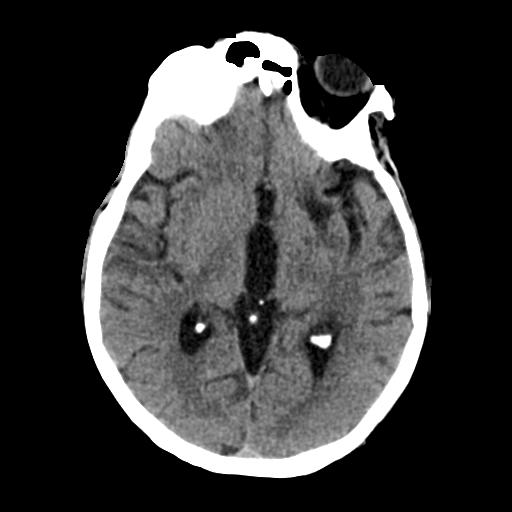
[im 15/33  bone]
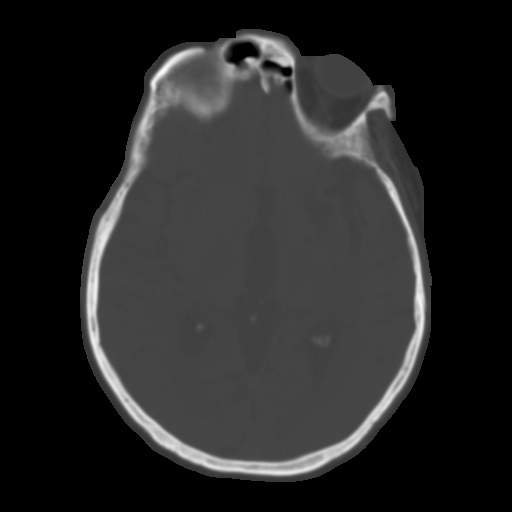
[im 18/33  brain]
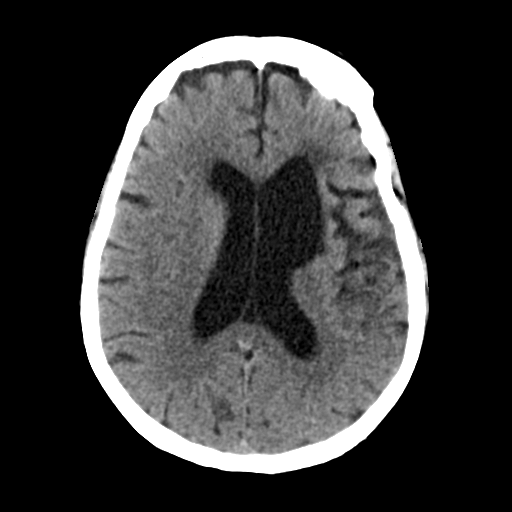
[im 21/33  brain]
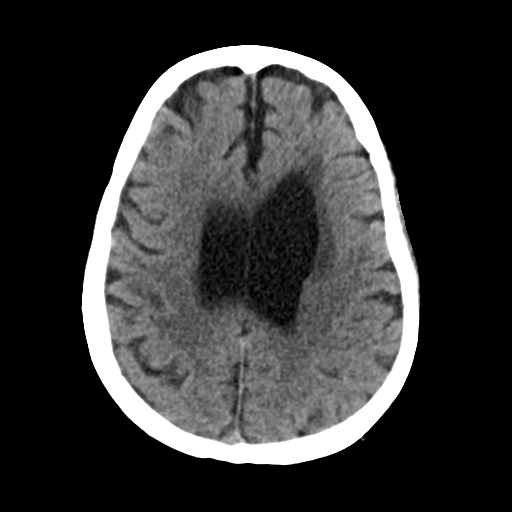
[im 25/33  brain]
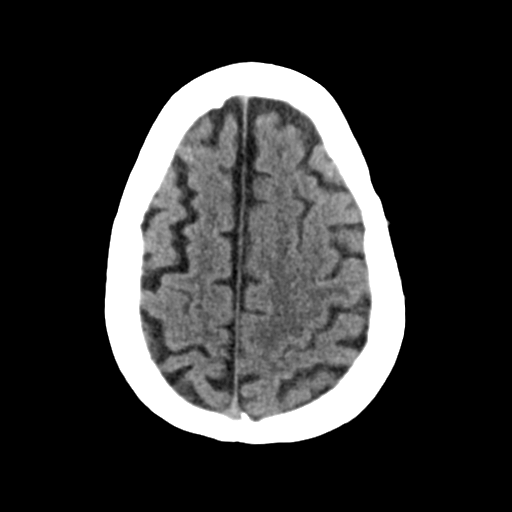
[im 27/33  brain]
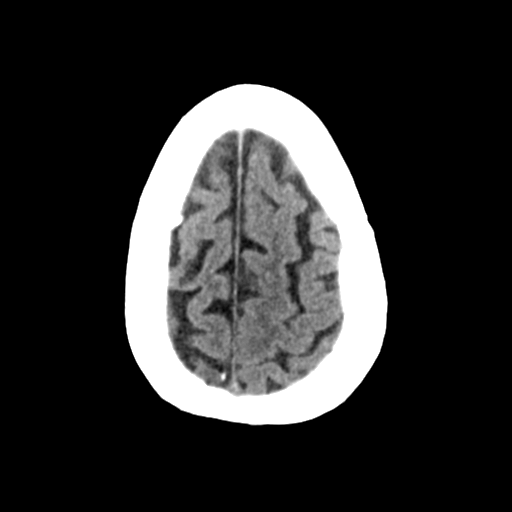
[im 27/33  bone]
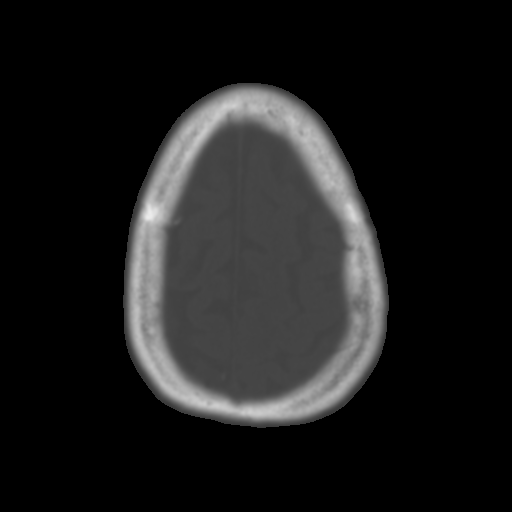
[im 30/33  brain]
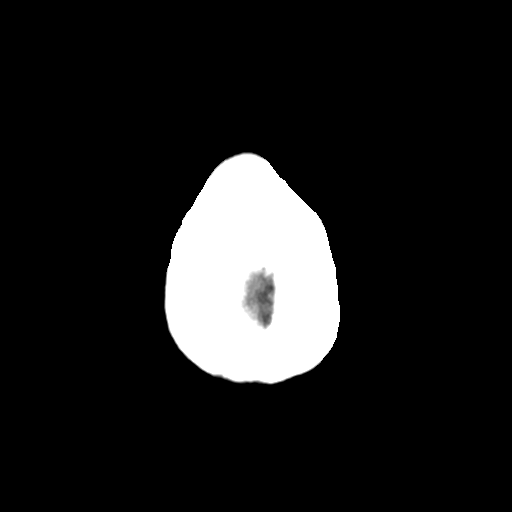

[Series 4: coronal soft tissue · coronal · 0.31mm/px · 3 of 67 slices shown]
[im 23/67  brain]
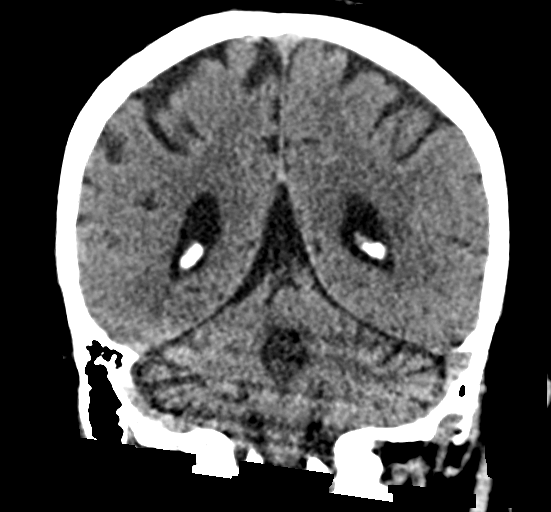
[im 30/67  brain]
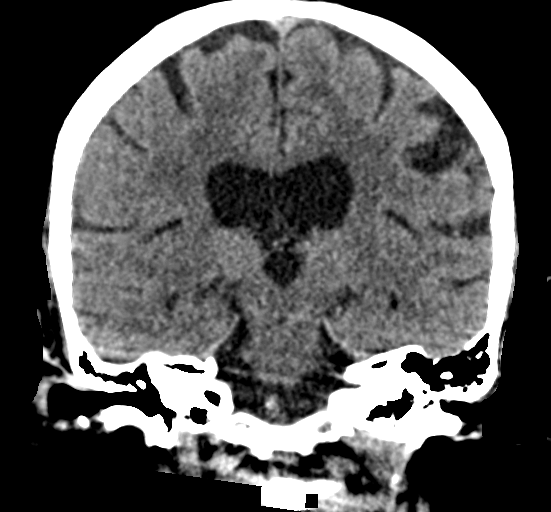
[im 37/67  brain]
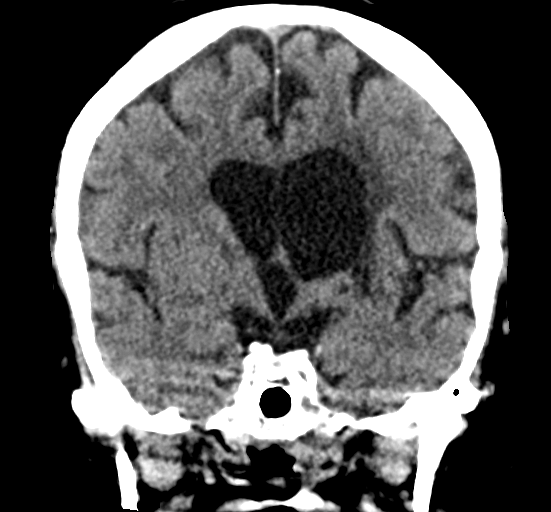

[Series 5: sagittal soft tissue · sagittal · 0.37mm/px · 3 of 51 slices shown]
[im 17/51  brain]
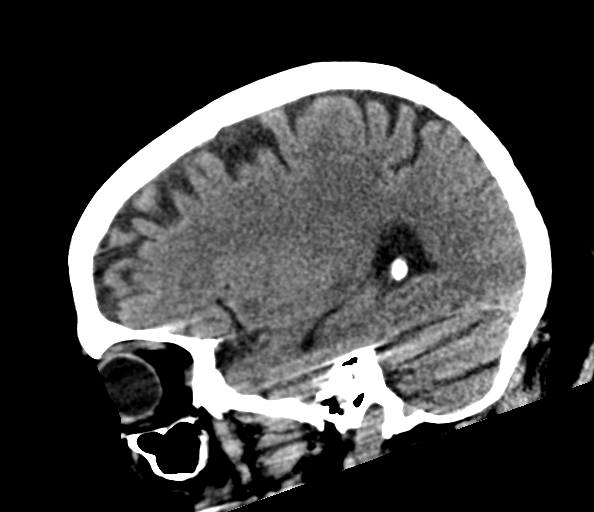
[im 26/51  brain]
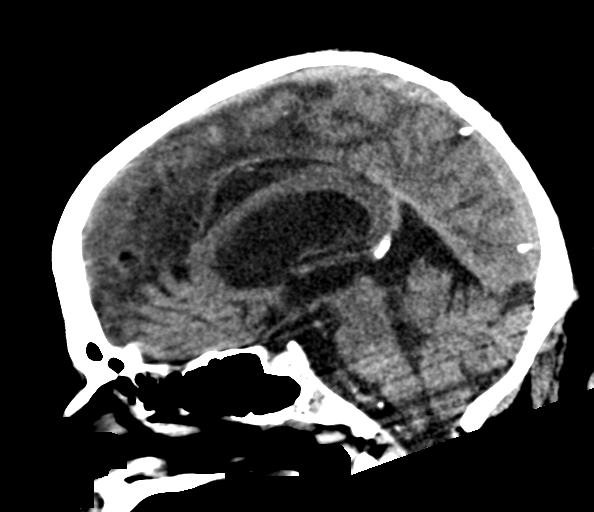
[im 34/51  brain]
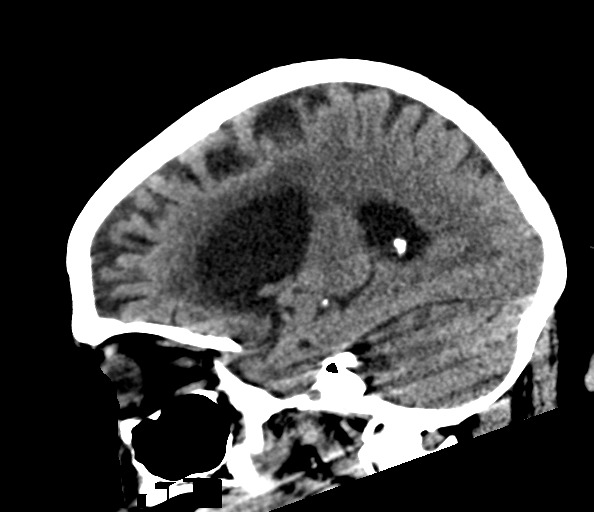

[16 of 47 positions shown; findings below may reference images not displayed]

FINDINGS: Brain: There is no evidence of acute cortical infarct, intracranial
hemorrhage, mass, midline shift, or extra-axial fluid collection. A
chronic infarct is again seen involving the left basal ganglia and
corona radiata with dilatation of the left lateral ventricle and
associated wallerian degeneration in the brainstem. Additional mild
scattered cerebral white matter hypodensities elsewhere are
nonspecific but compatible with chronic small vessel ischemic
disease.

Vascular: Calcified atherosclerosis at the skullbase. No hyperdense
vessel.

Skull: No fracture or focal osseous lesion.

Sinuses/Orbits: Paranasal sinuses and mastoid air cells are clear.
Bilateral cataract extraction.

Other: None.
IMPRESSION: 1. No evidence of acute intracranial abnormality.
2. Chronic left basal ganglia infarct.

## 2018-08-15 IMAGING — MR MR HEAD W/O CM
9 of 10 series · 35 of 48 positions shown · non-contrast
Comparison: Head CT from yesterday.  Brain MRI 07/08/2016

CLINICAL DATA: CVA.

EXAM:
MRI HEAD WITHOUT CONTRAST
TECHNIQUE: Multiplanar, multiecho pulse sequences of the brain and surrounding
structures were obtained without intravenous contrast.

[Series 2: T1 · sagittal · 5.0mm · 0.47mm/px · 2 of 23 slices shown (1 of 2)]
[im 1/23]
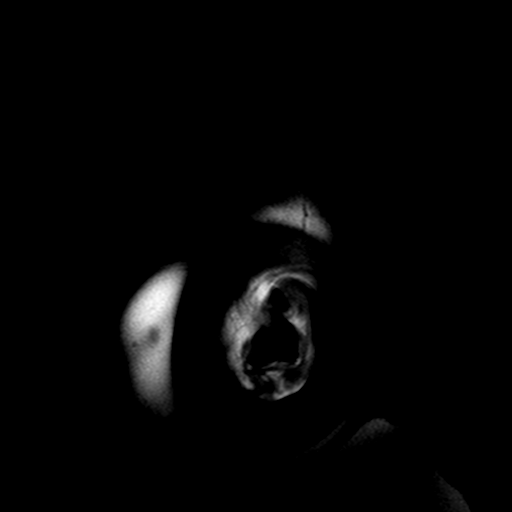
[im 23/23]
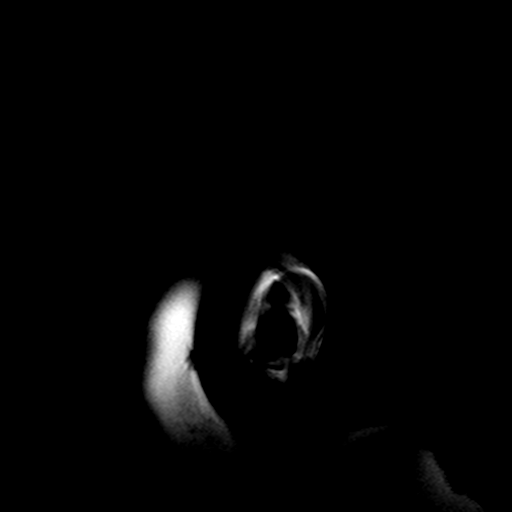

[Series 4: DWI · axial · 3.0mm · 0.94mm/px · z∈[-87,+71]mm · 5 of 54 slices shown (1 of 2)]
[im 1/54]
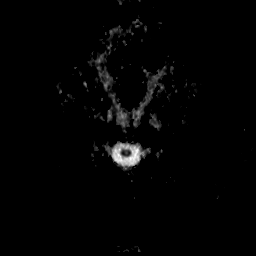
[im 14/54]
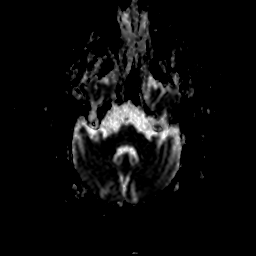
[im 27/54]
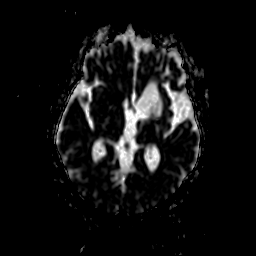
[im 40/54]
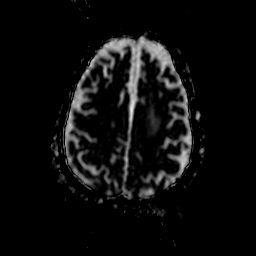
[im 54/54]
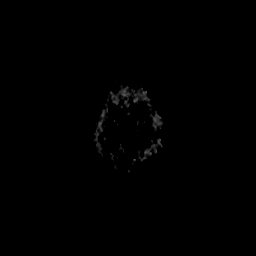

[Series 6: DWI · coronal · 5.0mm · 1.80mm/px · 4 of 39 slices shown (2 of 2)]
[im 1/39]
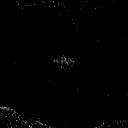
[im 13/39]
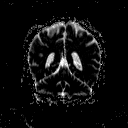
[im 26/39]
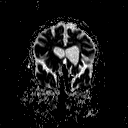
[im 39/39]
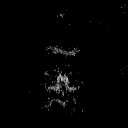

[Series 7: ax (id) · axial · 3.0mm · 0.94mm/px · z∈[-87,-9]mm · 3 of 54 slices shown]
[im 1/54]
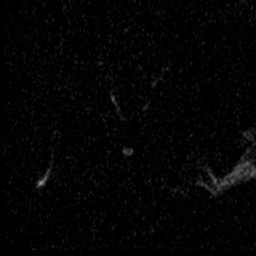
[im 14/54]
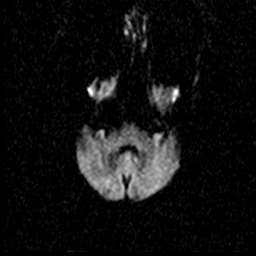
[im 27/54]
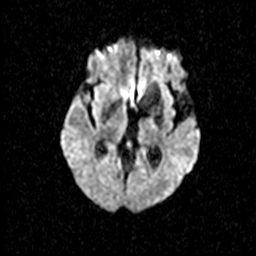

[Series 9: T2 · axial · 5.0mm · 0.45mm/px · z∈[-98,+69]mm · 2 of 25 slices shown (1 of 3)]
[im 1/25]
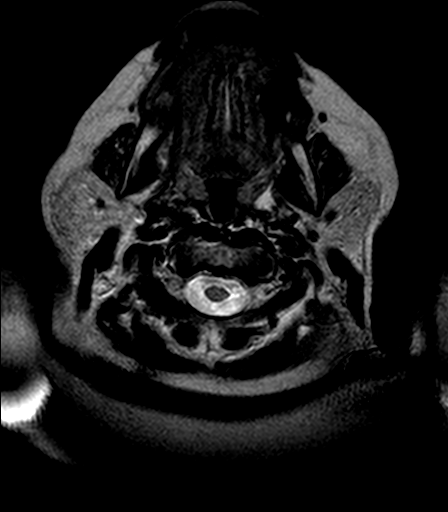
[im 25/25]
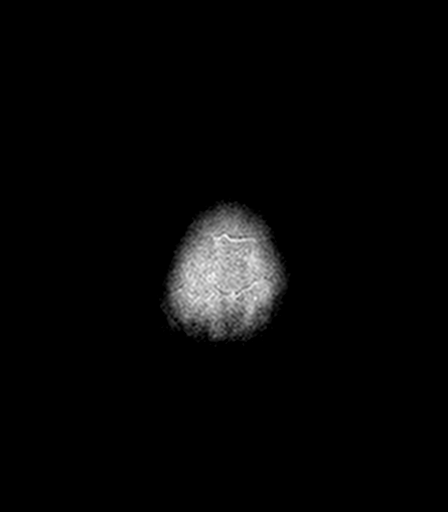

[Series 10: FLAIR · axial · 3.0mm · 0.90mm/px · z∈[-85,+67]mm · 5 of 52 slices shown]
[im 1/52]
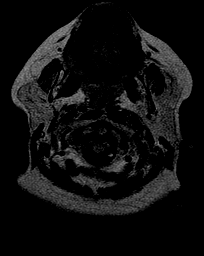
[im 13/52]
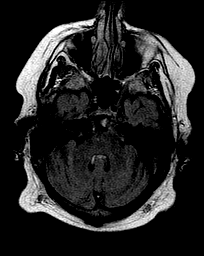
[im 26/52]
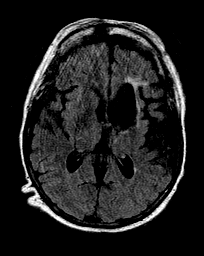
[im 39/52]
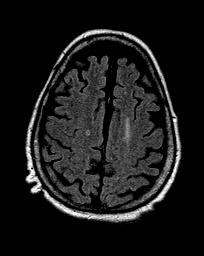
[im 52/52]
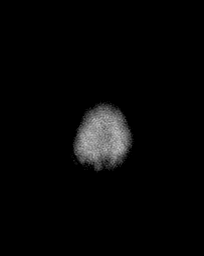

[Series 11: T2 · axial · 5.0mm · 0.45mm/px · z∈[-89,+72]mm · 3 of 28 slices shown (2 of 3)]
[im 1/28]
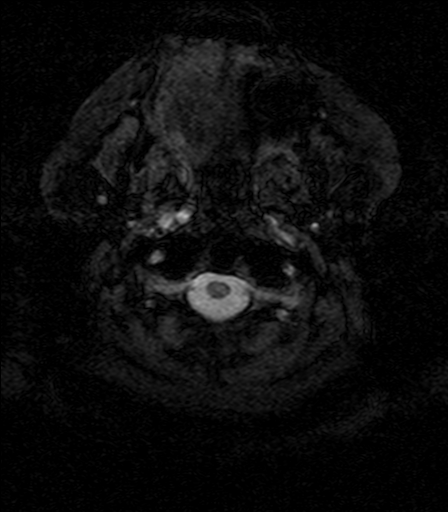
[im 14/28]
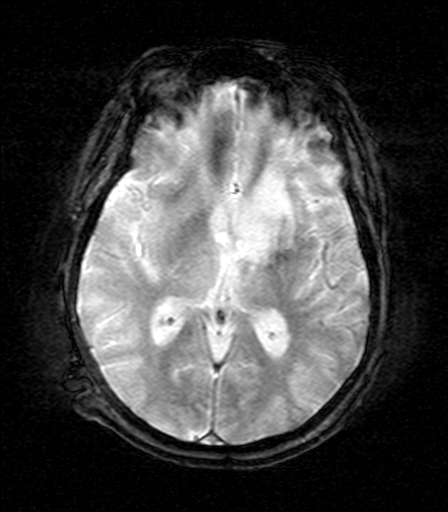
[im 28/28]
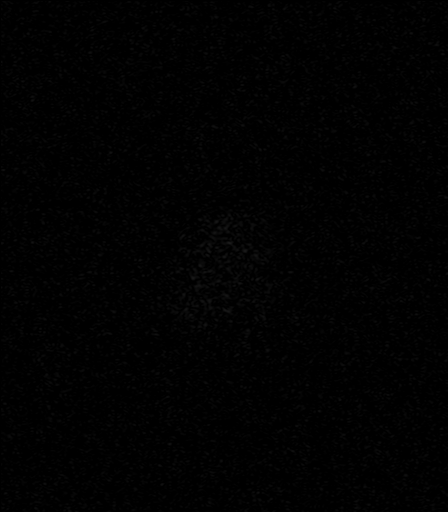

[Series 12: T1 · axial · 1.0mm · 0.45mm/px · z∈[-88,+70]mm · 8 of 160 slices shown (2 of 2)]
[im 1/160]
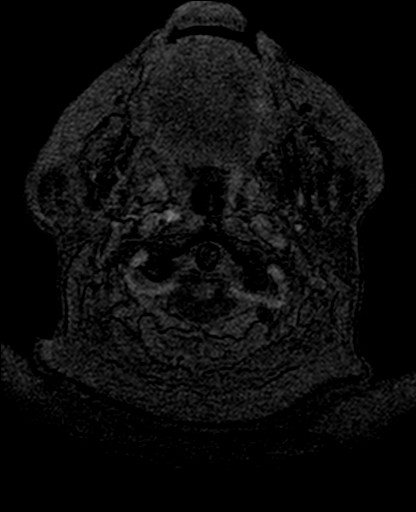
[im 23/160]
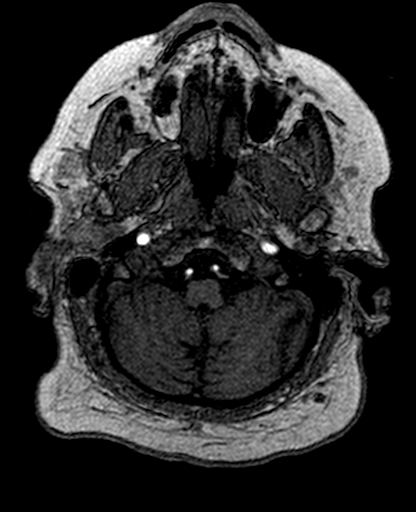
[im 46/160]
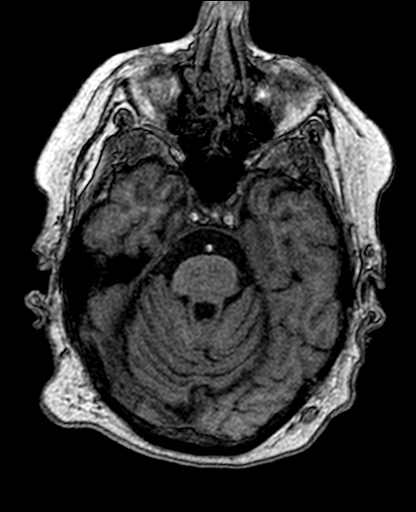
[im 69/160]
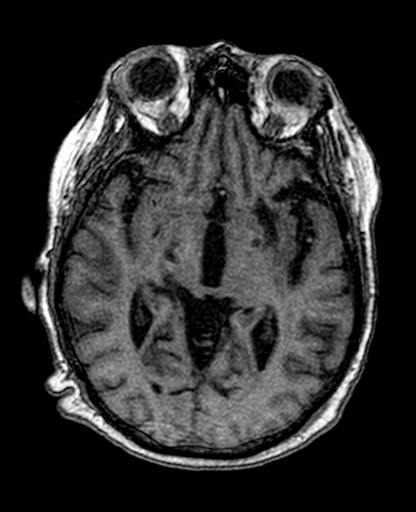
[im 91/160]
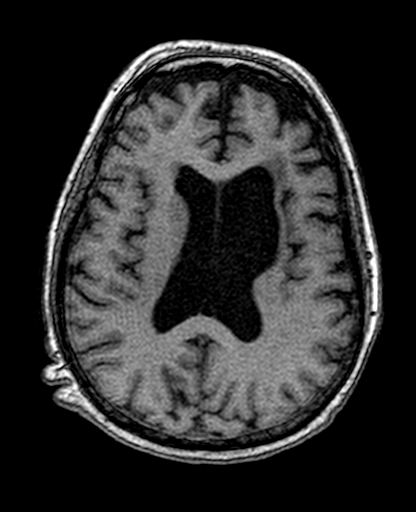
[im 114/160]
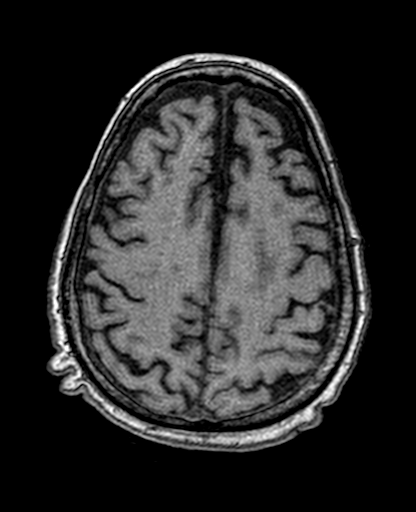
[im 137/160]
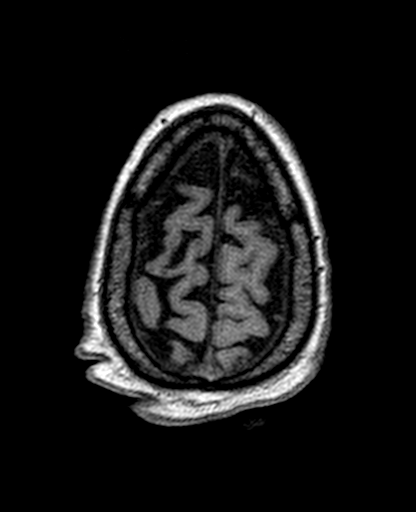
[im 160/160]
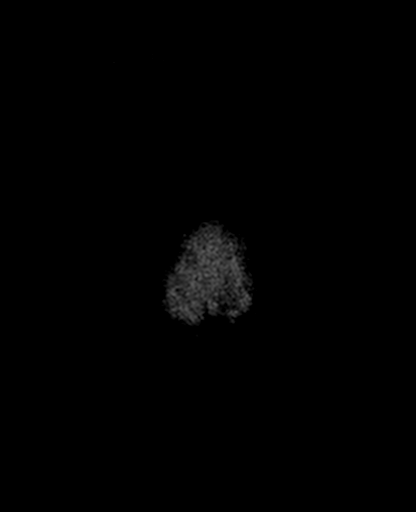

[Series 13: T2 · coronal · 5.0mm · 0.45mm/px · 3 of 30 slices shown (3 of 3)]
[im 1/30]
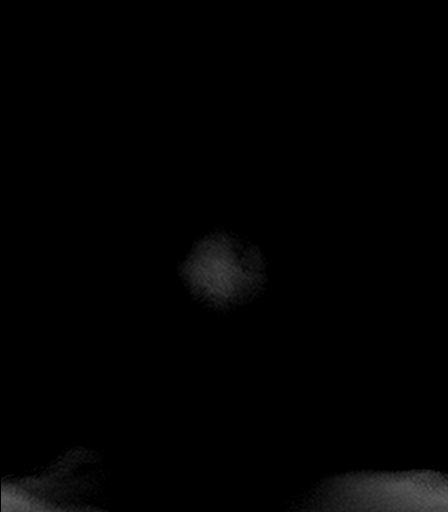
[im 15/30]
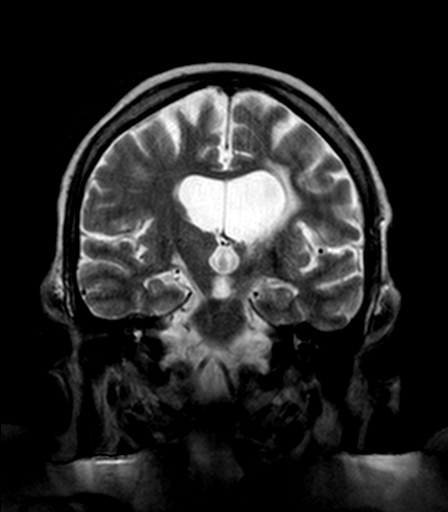
[im 30/30]
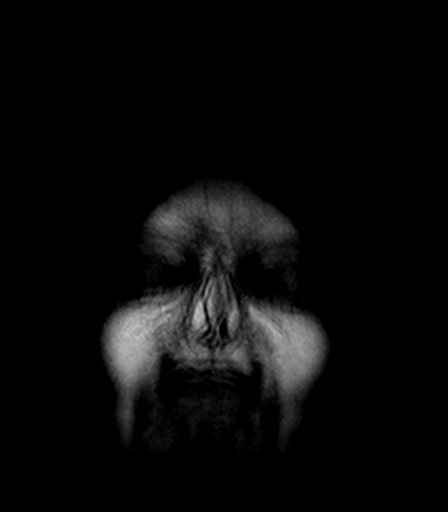

[35 of 48 positions shown; findings below may reference images not displayed]

FINDINGS: Brain: No acute infarction, hemorrhage, hydrocephalus, extra-axial
collection or mass lesion.

Remote left basal ganglia and neighboring white matter infarct with
ex vacuo dilatation of the left lateral ventricle. There has also
been remote low internal capsule lacunar infarct on the right with
wallerian degeneration seen into the brainstem. Remote lacunar
infarct in the right posterior frontal subcortical white matter.
Small remote high posterior right frontal cortex infarct.

Chronic rounded DWI hyperintensity at the level of the posterior
sella. No pituitary enlargement or underlying nodule seen on the
other sequences to suggest this is clinically significant.

Mild ventriculomegaly attributed to generalized volume loss.

Vascular: Preserved flow voids

Skull and upper cervical spine: Negative

Sinuses/Orbits: Bilateral cataract resection.  Otherwise negative.
IMPRESSION: 1. No acute finding or change since [DATE]. Remote small/medium vessel infarcts as described.

## 2018-08-21 ENCOUNTER — Telehealth: Payer: Self-pay

## 2018-08-21 NOTE — Telephone Encounter (Signed)
Pt called c/o having difficulty with left side being sluggish and/or slow.  Blood sugar was 256 this am around 10am which pt states that is good for her.  She stated that she would just go to sleep or shut her eyes for no reason then wake up and go back off to sleep.  Pt was home with daughter and husband and I asked to speak to one of them.  I spoke to the daughter and she advised that pt was not having any face drooping or any severe headaches and that pt hasn't slept much and felt that she needed to lay down to rest. Daughter will check blood pressure and keep an eye on pt.  Pt was advised that if anything changes or gets worse through the evening or before her appt on 08/22/18 at 2:40pm that she should call 911 or got to the hospital.  dbs

## 2018-08-21 NOTE — Telephone Encounter (Deleted)
Pt called back and I gave pap results to pt and when to repeat pap again.  dbs

## 2018-08-22 ENCOUNTER — Ambulatory Visit: Payer: Medicare Other | Admitting: Family Medicine

## 2018-08-22 NOTE — Telephone Encounter (Signed)
done

## 2018-08-23 ENCOUNTER — Encounter: Payer: Self-pay | Admitting: Family Medicine

## 2018-08-23 ENCOUNTER — Ambulatory Visit (INDEPENDENT_AMBULATORY_CARE_PROVIDER_SITE_OTHER): Payer: Medicare Other | Admitting: Family Medicine

## 2018-08-23 ENCOUNTER — Other Ambulatory Visit: Payer: Self-pay

## 2018-08-23 VITALS — BP 142/72 | HR 65 | Temp 98.1°F | Ht 61.0 in | Wt 230.0 lb

## 2018-08-23 DIAGNOSIS — L03114 Cellulitis of left upper limb: Secondary | ICD-10-CM

## 2018-08-23 DIAGNOSIS — N3090 Cystitis, unspecified without hematuria: Secondary | ICD-10-CM | POA: Diagnosis not present

## 2018-08-23 LAB — POCT URINALYSIS DIPSTICK
Glucose, UA: NEGATIVE
Ketones, UA: NEGATIVE
NITRITE UA: NEGATIVE
PROTEIN UA: POSITIVE — AB
Spec Grav, UA: 1.02 (ref 1.010–1.025)
UROBILINOGEN UA: 0.2 U/dL
pH, UA: 5 (ref 5.0–8.0)

## 2018-08-23 MED ORDER — CEPHALEXIN 500 MG PO CAPS: 500.0000 mg | ORAL_CAPSULE | Freq: Two times a day (BID) | ORAL | 0 refills | Status: DC

## 2018-08-23 NOTE — Patient Instructions (Signed)
We will call you with the urine culture next week. Apply warm compress to your right arm several x day.

## 2018-08-23 NOTE — Progress Notes (Signed)
  Subjective:     Patient ID: [name], female   DOB: 1955-08-21, 63 y.o.   MRN: [id number] Chief Complaint  Patient presents with  . Urinary Tract Infection    possible with pain at urinating, lower back pain, since 08/22/18  . Cyst    lower left arm that is red and painful since last night 08/22/18   HPI States she had a bout of loose stools prior to the onset of her dysuria. Also bumped her left forearm a few days ago and it only has started to get red and tender in the last 24 hours. Accompanied by her husband today.  Review of Systems      Objective:   Physical Exam Constitutional:      General: She is not in acute distress.    Appearance: She is not ill-appearing.  Genitourinary:    Comments: No cva tenderness Skin:    Comments: Left proximal  posterior forearm with area of mild swelling, tenderness, and erythema.  Neurological:     Mental Status: She is alert.        Assessment:    1. Cystitis - POCT Urinalysis Dipstick - Urine Culture - cephALEXin (KEFLEX) 500 MG capsule; Take 1 capsule (500 mg total) by mouth 2 (two) times daily.  Dispense: 14 capsule; Refill: 0  2. Cellulitis of left upper extremity - cephALEXin (KEFLEX) 500 MG capsule; Take 1 capsule (500 mg total) by mouth 2 (two) times daily.  Dispense: 14 capsule; Refill: 0    Plan:    Further f/u pending urine culture results.

## 2018-08-23 NOTE — Progress Notes (Signed)
m °

## 2018-08-25 LAB — URINE CULTURE

## 2018-08-26 ENCOUNTER — Telehealth: Payer: Self-pay

## 2018-08-26 NOTE — Telephone Encounter (Signed)
Patient has been advised and states that her arm is improving. KW

## 2018-08-26 NOTE — Telephone Encounter (Signed)
-----   Message from Carmon Ginsberg, [place] sent at 08/26/2018  7:25 AM EST ----- You have a Klebsiella infection which should respond to the cephalexin.  Is your arm getting better as well?

## 2018-08-28 ENCOUNTER — Encounter: Payer: Self-pay | Admitting: Emergency Medicine

## 2018-08-28 ENCOUNTER — Ambulatory Visit (INDEPENDENT_AMBULATORY_CARE_PROVIDER_SITE_OTHER): Payer: Medicare Other | Admitting: Family Medicine

## 2018-08-28 ENCOUNTER — Observation Stay
Admission: EM | Admit: 2018-08-28 | Discharge: 2018-08-30 | Disposition: A | Discharge status: Home / Self Care | Payer: Medicare Other | Attending: Internal Medicine | Admitting: Internal Medicine

## 2018-08-28 ENCOUNTER — Other Ambulatory Visit: Payer: Self-pay

## 2018-08-28 DIAGNOSIS — Z794 Long term (current) use of insulin: Secondary | ICD-10-CM | POA: Diagnosis not present

## 2018-08-28 DIAGNOSIS — Z885 Allergy status to narcotic agent status: Secondary | ICD-10-CM | POA: Insufficient documentation

## 2018-08-28 DIAGNOSIS — I10 Essential (primary) hypertension: Secondary | ICD-10-CM | POA: Diagnosis not present

## 2018-08-28 DIAGNOSIS — Z833 Family history of diabetes mellitus: Secondary | ICD-10-CM | POA: Diagnosis not present

## 2018-08-28 DIAGNOSIS — E161 Other hypoglycemia: Secondary | ICD-10-CM | POA: Diagnosis not present

## 2018-08-28 DIAGNOSIS — E162 Hypoglycemia, unspecified: Secondary | ICD-10-CM | POA: Diagnosis not present

## 2018-08-28 DIAGNOSIS — K219 Gastro-esophageal reflux disease without esophagitis: Secondary | ICD-10-CM | POA: Diagnosis not present

## 2018-08-28 DIAGNOSIS — Z6841 Body Mass Index (BMI) 40.0 and over, adult: Secondary | ICD-10-CM | POA: Insufficient documentation

## 2018-08-28 DIAGNOSIS — K589 Irritable bowel syndrome without diarrhea: Secondary | ICD-10-CM | POA: Diagnosis not present

## 2018-08-28 DIAGNOSIS — N183 Chronic kidney disease, stage 3 unspecified: Secondary | ICD-10-CM

## 2018-08-28 DIAGNOSIS — Z8249 Family history of ischemic heart disease and other diseases of the circulatory system: Secondary | ICD-10-CM | POA: Insufficient documentation

## 2018-08-28 DIAGNOSIS — E0822 Diabetes mellitus due to underlying condition with diabetic chronic kidney disease: Secondary | ICD-10-CM | POA: Diagnosis not present

## 2018-08-28 DIAGNOSIS — E1165 Type 2 diabetes mellitus with hyperglycemia: Secondary | ICD-10-CM | POA: Diagnosis not present

## 2018-08-28 DIAGNOSIS — E11649 Type 2 diabetes mellitus with hypoglycemia without coma: Secondary | ICD-10-CM | POA: Diagnosis not present

## 2018-08-28 DIAGNOSIS — R001 Bradycardia, unspecified: Secondary | ICD-10-CM | POA: Diagnosis not present

## 2018-08-28 DIAGNOSIS — E1122 Type 2 diabetes mellitus with diabetic chronic kidney disease: Secondary | ICD-10-CM | POA: Diagnosis not present

## 2018-08-28 DIAGNOSIS — E785 Hyperlipidemia, unspecified: Secondary | ICD-10-CM | POA: Diagnosis not present

## 2018-08-28 DIAGNOSIS — B961 Klebsiella pneumoniae [K. pneumoniae] as the cause of diseases classified elsewhere: Secondary | ICD-10-CM | POA: Diagnosis not present

## 2018-08-28 DIAGNOSIS — Z8673 Personal history of transient ischemic attack (TIA), and cerebral infarction without residual deficits: Secondary | ICD-10-CM | POA: Diagnosis not present

## 2018-08-28 DIAGNOSIS — R404 Transient alteration of awareness: Secondary | ICD-10-CM | POA: Diagnosis not present

## 2018-08-28 DIAGNOSIS — N390 Urinary tract infection, site not specified: Secondary | ICD-10-CM | POA: Diagnosis not present

## 2018-08-28 DIAGNOSIS — Z79899 Other long term (current) drug therapy: Secondary | ICD-10-CM | POA: Diagnosis not present

## 2018-08-28 DIAGNOSIS — Z7902 Long term (current) use of antithrombotics/antiplatelets: Secondary | ICD-10-CM | POA: Diagnosis not present

## 2018-08-28 DIAGNOSIS — Z888 Allergy status to other drugs, medicaments and biological substances status: Secondary | ICD-10-CM | POA: Insufficient documentation

## 2018-08-28 DIAGNOSIS — I959 Hypotension, unspecified: Secondary | ICD-10-CM | POA: Diagnosis not present

## 2018-08-28 LAB — GLUCOSE, CAPILLARY
Glucose-Capillary: 107 mg/dL — ABNORMAL HIGH (ref 70–99)
Glucose-Capillary: 59 mg/dL — ABNORMAL LOW (ref 70–99)
Glucose-Capillary: 78 mg/dL (ref 70–99)
Glucose-Capillary: 79 mg/dL (ref 70–99)
Glucose-Capillary: 103 mg/dL — ABNORMAL HIGH (ref 70–99)
Glucose-Capillary: 230 mg/dL — ABNORMAL HIGH (ref 70–99)
Glucose-Capillary: 68 mg/dL — ABNORMAL LOW (ref 70–99)
Glucose-Capillary: 60 mg/dL — ABNORMAL LOW (ref 70–99)
Glucose-Capillary: 69 mg/dL — ABNORMAL LOW (ref 70–99)
Glucose-Capillary: 80 mg/dL (ref 70–99)
Glucose-Capillary: 70 mg/dL (ref 70–99)
Glucose-Capillary: 58 mg/dL — ABNORMAL LOW (ref 70–99)
Glucose-Capillary: 67 mg/dL — ABNORMAL LOW (ref 70–99)
Glucose-Capillary: 137 mg/dL — ABNORMAL HIGH (ref 70–99)
Glucose-Capillary: 211 mg/dL — ABNORMAL HIGH (ref 70–99)

## 2018-08-28 LAB — URINALYSIS, COMPLETE (UACMP) WITH MICROSCOPIC
Bilirubin Urine: NEGATIVE
Glucose, UA: NEGATIVE mg/dL
Hgb urine dipstick: NEGATIVE
Ketones, ur: NEGATIVE mg/dL
NITRITE: NEGATIVE
Protein, ur: 100 mg/dL — AB
Specific Gravity, Urine: 1.025 (ref 1.005–1.030)
pH: 5 (ref 5.0–8.0)

## 2018-08-28 LAB — CBC
HCT: 35.6 % — ABNORMAL LOW (ref 36.0–46.0)
Hemoglobin: 11.3 g/dL — ABNORMAL LOW (ref 12.0–15.0)
MCH: 28.6 pg (ref 26.0–34.0)
MCHC: 31.7 g/dL (ref 30.0–36.0)
MCV: 90.1 fL (ref 80.0–100.0)
PLATELETS: 231 10*3/uL (ref 150–400)
RBC: 3.95 MIL/uL (ref 3.87–5.11)
RDW: 13.6 % (ref 11.5–15.5)
WBC: 9.4 10*3/uL (ref 4.0–10.5)
nRBC: 0 % (ref 0.0–0.2)

## 2018-08-28 LAB — BASIC METABOLIC PANEL
ANION GAP: 9 (ref 5–15)
BUN: 33 mg/dL — ABNORMAL HIGH (ref 8–23)
CO2: 24 mmol/L (ref 22–32)
Calcium: 8.6 mg/dL — ABNORMAL LOW (ref 8.9–10.3)
Chloride: 106 mmol/L (ref 98–111)
Creatinine, Ser: 1.3 mg/dL — ABNORMAL HIGH (ref 0.44–1.00)
GFR calc Af Amer: 51 mL/min — ABNORMAL LOW (ref >60)
GFR calc non Af Amer: 44 mL/min — ABNORMAL LOW (ref >60)
Glucose, Bld: 114 mg/dL — ABNORMAL HIGH (ref 70–99)
Potassium: 4.3 mmol/L (ref 3.5–5.1)
Sodium: 139 mmol/L (ref 135–145)

## 2018-08-28 MED ORDER — ONDANSETRON HCL 4 MG PO TABS: 4.0000 mg | ORAL_TABLET | Freq: Four times a day (QID) | ORAL | Status: DC | PRN

## 2018-08-28 MED ORDER — OXYBUTYNIN CHLORIDE 5 MG PO TABS
5.0000 mg | ORAL_TABLET | Freq: Two times a day (BID) | ORAL | Status: DC
  Administered 2018-08-29 – 2018-08-30 (×4): 5 mg via ORAL
  Filled 2018-08-28 (×4): qty 1

## 2018-08-28 MED ORDER — ACETAMINOPHEN 650 MG RE SUPP: 650.0000 mg | Freq: Four times a day (QID) | RECTAL | Status: DC | PRN

## 2018-08-28 MED ORDER — HYDROCODONE-ACETAMINOPHEN 10-325 MG PO TABS
1.0000 | ORAL_TABLET | Freq: Four times a day (QID) | ORAL | Status: DC | PRN
  Administered 2018-08-29: 1 via ORAL
  Administered 2018-08-30 (×2): 2 via ORAL
  Filled 2018-08-28: qty 1
  Filled 2018-08-28 (×2): qty 2

## 2018-08-28 MED ORDER — ATORVASTATIN CALCIUM 20 MG PO TABS
20.0000 mg | ORAL_TABLET | Freq: Every day | ORAL | Status: DC
  Administered 2018-08-29 (×2): 20 mg via ORAL
  Filled 2018-08-28 (×2): qty 1

## 2018-08-28 MED ORDER — AMLODIPINE BESYLATE 10 MG PO TABS
10.0000 mg | ORAL_TABLET | Freq: Every day | ORAL | Status: DC
  Administered 2018-08-29 – 2018-08-30 (×2): 10 mg via ORAL
  Filled 2018-08-28 (×2): qty 1

## 2018-08-28 MED ORDER — PANTOPRAZOLE SODIUM 40 MG PO TBEC
40.0000 mg | DELAYED_RELEASE_TABLET | Freq: Every day | ORAL | Status: DC
  Administered 2018-08-29 – 2018-08-30 (×2): 40 mg via ORAL
  Filled 2018-08-28 (×2): qty 1

## 2018-08-28 MED ORDER — ONDANSETRON HCL 4 MG/2ML IJ SOLN: 4.0000 mg | Freq: Four times a day (QID) | INTRAMUSCULAR | Status: DC | PRN

## 2018-08-28 MED ORDER — CLOPIDOGREL BISULFATE 75 MG PO TABS
75.0000 mg | ORAL_TABLET | Freq: Every day | ORAL | Status: DC
  Administered 2018-08-29 – 2018-08-30 (×2): 75 mg via ORAL
  Filled 2018-08-28 (×2): qty 1

## 2018-08-28 MED ORDER — SODIUM CHLORIDE 0.9% FLUSH
3.0000 mL | Freq: Once | INTRAVENOUS | Status: AC
  Administered 2018-08-28: 3 mL via INTRAVENOUS

## 2018-08-28 MED ORDER — METOPROLOL SUCCINATE ER 100 MG PO TB24
100.0000 mg | ORAL_TABLET | Freq: Every day | ORAL | Status: DC
  Administered 2018-08-29 – 2018-08-30 (×2): 100 mg via ORAL
  Filled 2018-08-28 (×2): qty 1

## 2018-08-28 MED ORDER — LEVETIRACETAM 500 MG PO TABS
500.0000 mg | ORAL_TABLET | Freq: Two times a day (BID) | ORAL | Status: DC
  Administered 2018-08-29 – 2018-08-30 (×4): 500 mg via ORAL
  Filled 2018-08-28 (×4): qty 1

## 2018-08-28 MED ORDER — ACETAMINOPHEN 325 MG PO TABS: 650.0000 mg | ORAL_TABLET | Freq: Four times a day (QID) | ORAL | Status: DC | PRN

## 2018-08-28 MED ORDER — ENOXAPARIN SODIUM 40 MG/0.4ML ~~LOC~~ SOLN
40.0000 mg | SUBCUTANEOUS | Status: DC
  Administered 2018-08-29: 40 mg via SUBCUTANEOUS
  Filled 2018-08-28: qty 0.4

## 2018-08-28 MED ORDER — DEXTROSE 10 % IV SOLN
INTRAVENOUS | Status: DC
  Administered 2018-08-28 – 2018-08-29 (×2): via INTRAVENOUS

## 2018-08-28 NOTE — ED Notes (Signed)
Meal and soda provided and d10 stopped per dr. Posey Pronto. Will recheck sugar after eating

## 2018-08-28 NOTE — Progress Notes (Signed)
Family Meeting Note  Patient being admitted with hypoglycemia. She has history of diabetes hypertension hyperlipidemia morbid obesity and Gerd. Patient feeling relatively better. Will admit for overnight observation for sugars.  Code status discussed patient is a full code. Time spent 16 minutes [name], MD

## 2018-08-28 NOTE — H&P (Signed)
[facility] [place] NAME: Susan House    MR#:  [id number]  DATE OF BIRTH:  Nov 15, 1955  DATE OF ADMISSION:  08/28/2018  PRIMARY CARE PHYSICIAN: Jerrol Banana., MD   REQUESTING/REFERRING PHYSICIAN: Dr. Rip Harbour  CHIEF COMPLAINT:  my sugars went down to 30 at the doctor's office  HISTORY OF PRESENT ILLNESS:  Susan House  is a 63 y.o. female with a known history of two diabetes on insulin, hypertension, Gerd, lipid EMEA comes to the emergency room after she was found to be hypoglycemic at Dr. Alben Spittle office. Patient had gone for her husband's appointment. She checked her sugar at home in the morning was 78. She skipped breakfast. She took her morning meds including her insulin. At that [place] office she passed out. They checked the sugar it was 30 and repeat sugar check was 20. She was into the emergency room. She was started on dextrose 10% drip. Her sugars have been anywhere from 70 to 168. Patient states she is feeling fine back to baseline at present.  Apparently patient was started on Insulin tresiba 20 units daily recently for her uncontrolled sugars. She also takes metformin thousand milligrams BID.  Patient apparently was accidentally taking Insulin tresiba 20 units twice a day instead of once a day.  She is being admitted for overnight observation for hyperglycemia  Patient A1c is 12.0 in January 2020. Her insulin 7030 was stopped and new insulin prescription was given along with metformin  PAST MEDICAL HISTORY:   Past Medical History:  Diagnosis Date  . Allergy   . Arthritis   . Cellulitis and abscess of face   . Depression   . Diabetes mellitus ([place])   . Edema   . GERD (gastroesophageal reflux disease)   . Hematuria   . History of echocardiogram    2008: normal, 2011: normal LVSF  . History of nuclear stress test    a. 12/2009: lexiscan - negative  . Hyperlipidemia   . Hypertension   . IBS  (irritable bowel syndrome)   . Migraine   . Morbid obesity ([place])   . Nocturia   . Stroke ([place]) 2000  . TIA (transient ischemic attack) 2010  . Urgency of micturation   . Urinary frequency   . Urinary incontinence     PAST SURGICAL HISTOIRY:   Past Surgical History:  Procedure Laterality Date  . ABDOMINAL HYSTERECTOMY    . ABDOMINOPLASTY    . APPENDECTOMY    . BACK SURGERY     extensive spinal fusion  . carpel tunnel surgery    . CATARACT EXTRACTION     x 2  . CESAREAN SECTION    . CHOLECYSTECTOMY    . COLONOSCOPY  2010  . COLONOSCOPY WITH PROPOFOL N/A 07/11/2016   Procedure: COLONOSCOPY WITH PROPOFOL;  Surgeon: Lucilla Lame, MD;  Location: [facility] ENDOSCOPY;  Service: Endoscopy;  Laterality: N/A;  . COLONOSCOPY WITH PROPOFOL N/A 03/26/2018   Procedure: COLONOSCOPY WITH PROPOFOL;  Surgeon: Lucilla Lame, MD;  [facility] ENDOSCOPY;  Service: Endoscopy;  Laterality: N/A;  . ESOPHAGOGASTRODUODENOSCOPY (EGD) WITH PROPOFOL N/A 03/26/2018   Procedure: ESOPHAGOGASTRODUODENOSCOPY (EGD) WITH PROPOFOL;  Surgeon: Lucilla Lame, MD;  Location: [facility] ENDOSCOPY;  Service: Endoscopy;  Laterality: N/A;  . EYE SURGERY    . HERNIA REPAIR    . SHOULDER SURGERY    . TOE SURGERY    . UPPER GI ENDOSCOPY    . WRIST FRACTURE SURGERY Left  SOCIAL HISTORY:   Social History   Tobacco Use  . Smoking status: Never Smoker  . Smokeless tobacco: Never Used  Substance Use Topics  . Alcohol use: No    FAMILY HISTORY:   Family History  Problem Relation Age of Onset  . Hyperlipidemia Mother   . Arrhythmia Mother        WPW  . Hypertension Mother   . Rheum arthritis Mother   . Heart disease Mother   . Heart attack Father 66  . Hyperlipidemia Father   . Hypertension Father   . Stroke Father   . Diabetes Father   . Heart disease Father   . Coronary artery disease Father   . Diabetes Sister   . Hyperlipidemia Sister   . Hypertension Sister   . Depression Sister   . Depression Sister   .  Diabetes Sister   . Hypertension Sister   . Hyperlipidemia Sister   . Kidney disease Paternal Grandmother     DRUG ALLERGIES:   Allergies  Allergen Reactions  . Morphine And Related Anaphylaxis  . Fentanyl Other (See Comments)    Unknown reaction.  . Reglan [Metoclopramide]     Other reaction(s): Unknown Elevated BP  . Simvastatin Other (See Comments)    Muscle pain  . Betadine [Povidone Iodine] Rash  . Iodine Rash    Other reaction(s): Unknown-not allergic to ct contrast-ars 04/01/18  . Tetracyclines & Related Rash    Other reaction(s): Unknown    REVIEW OF SYSTEMS:  Review of Systems  Constitutional: Negative for chills, fever and weight loss.  HENT: Negative for ear discharge, ear pain and nosebleeds.   Eyes: Negative for blurred vision, pain and discharge.  Respiratory: Negative for sputum production, shortness of breath, wheezing and stridor.   Cardiovascular: Negative for chest pain, palpitations, orthopnea and PND.  Gastrointestinal: Negative for abdominal pain, diarrhea, nausea and vomiting.  Genitourinary: Negative for frequency and urgency.  Musculoskeletal: Negative for back pain and joint pain.  Neurological: Negative for sensory change, speech change, focal weakness and weakness.  Psychiatric/Behavioral: Negative for depression and hallucinations. The patient is not nervous/anxious.      MEDICATIONS AT HOME:   Prior to Admission medications   Medication Sig Start Date End Date Taking? Authorizing Provider  ACCU-CHEK SOFTCLIX LANCETS lancets CHECK BLOOD SUGAR THREE TIMES DAILY 04/24/16  Yes Jerrol Banana., MD  amLODipine [facility]) 10 MG tablet Take 1 tablet by mouth daily 06/17/18  Yes Jerrol Banana., MD  atorvastatin (LIPITOR) 20 MG tablet Take 1 tablet (20 mg total) by mouth daily. 08/13/18  Yes Jerrol Banana., MD  Blood Glucose Monitoring Suppl (ACCU-CHEK AVIVA PLUS) w/Device KIT Check sugar three times daily DX E11.9-needs a meter  11/23/15  Yes Jerrol Banana., MD  cephALEXin (KEFLEX) 500 MG capsule Take 1 capsule (500 mg total) by mouth 2 (two) times daily. 08/23/18  Yes Carmon Ginsberg, PA  cloNIDine (CATAPRES) 0.3 MG tablet Take 1 tablet (0.3 mg total) by mouth 2 (two) times daily. 01/01/18  Yes Jerrol Banana., MD  clopidogrel (PLAVIX) 75 MG tablet Take 1 tablet (75 mg total) by mouth daily. 10/10/17 10/10/18 Yes Dohmeier, Asencion Partridge, MD  Continuous Blood Gluc Receiver (FREESTYLE LIBRE 14 DAY READER) DEVI LENGTH OF NEED: LIFETIME - UNLESS SPECIFIED OTHERWISE 06/27/18  Yes Jerrol Banana., MD  Continuous Blood Gluc Sensor (FREESTYLE LIBRE 14 DAY SENSOR) MISC LENGTH OF NEED: LIFETIME - UNLESS SPECIFIED OTHERWISE 06/27/18  Yes Rosanna Randy,  Retia Passe., MD  enalapril (VASOTEC) 20 MG tablet Take 1 tablet (20 mg total) by mouth 2 (two) times daily. 02/04/18  Yes Jerrol Banana., MD  fluconazole (DIFLUCAN) 150 MG tablet Take 1 tablet (150 mg total) by mouth daily. 07/30/18  Yes Jerrol Banana., MD  glucose blood (ACCU-CHEK AVIVA PLUS) test strip TEST BLOOD SUGAR THREE TIMES DAILY 12/05/17  Yes Jerrol Banana., MD  hydrALAZINE (APRESOLINE) 100 MG tablet Take 1 tablet (100 mg total) by mouth 2 (two) times daily. 01/12/18  Yes Jerrol Banana., MD  HYDROcodone-acetaminophen [facility]) 10-325 MG tablet Take 1-2 tablets by mouth every 6 (six) hours as needed. 05/07/18  Yes Jerrol Banana., MD  insulin degludec (TRESIBA FLEXTOUCH) 100 UNIT/ML SOPN FlexTouch Pen Up to 30 units daily. 07/29/18  Yes Jerrol Banana., MD  Lancet Devices [facility]) lancets Check sugar three times daily DX E11.9 11/23/15  Yes Jerrol Banana., MD  levETIRAcetam (KEPPRA) 500 MG tablet Take 1 tablet (500 mg total) by mouth 2 (two) times daily. 500 mg bid. 04/30/18  Yes Dohmeier, Asencion Partridge, MD  metFORMIN (GLUCOPHAGE) 1000 MG tablet Take 1 tablet (1,000 mg total) by mouth 2 (two) times daily. 07/22/18  Yes Jerrol Banana., MD  metoprolol succinate (TOPROL-XL) 100 MG 24 hr tablet Take 1 tablet (100 mg total) by mouth daily. 07/31/17  Yes Jerrol Banana., MD  nitroGLYCERIN (NITROSTAT) 0.4 MG SL tablet Place 1 tablet (0.4 mg total) under the tongue every 5 (five) minutes as needed for chest pain. 10/11/15  Yes Jerrol Banana., MD  oxybutynin (DITROPAN) 5 MG tablet Take 1 tablet (5 mg total) by mouth 2 (two) times daily. 07/31/17  Yes Jerrol Banana., MD  oxyCODONE (OXY IR/ROXICODONE) 5 MG immediate release tablet Take 1 tablet (5 mg total) by mouth every 4 (four) hours as needed for severe pain. 07/15/18  Yes Carmon Ginsberg, PA  pantoprazole (PROTONIX) 40 MG tablet Take 1 tablet (40 mg total) by mouth daily. 08/13/18  Yes Jerrol Banana., MD      VITAL SIGNS:  Blood pressure (!) 115/48, pulse (!) 45, temperature 97.7 F (36.5 C), temperature source Oral, resp. rate (!) 9, height 5' 1"  (1.549 m), weight 103.4 kg, SpO2 96 %.  PHYSICAL EXAMINATION:  GENERAL:  63 y.o.-year-old patient lying in the bed with no acute distress. Morbid obesity EYES: Pupils equal, round, reactive to light and accommodation. No scleral icterus. Extraocular muscles intact.  HEENT: Head atraumatic, normocephalic. Oropharynx and nasopharynx clear.  NECK:  Supple, no jugular venous distention. No thyroid enlargement, no tenderness.  LUNGS: Normal breath sounds bilaterally, no wheezing, rales,rhonchi or crepitation. No use of accessory muscles of respiration.  CARDIOVASCULAR: S1, S2 normal. No murmurs, rubs, or gallops.  ABDOMEN: Soft, nontender, nondistended. Bowel sounds present. No organomegaly or mass.  EXTREMITIES: No pedal edema, cyanosis, or clubbing.  NEUROLOGIC: Cranial nerves II through XII are intact. Muscle strength 5/5 in all extremities. Sensation intact. Gait not checked.  PSYCHIATRIC: The patient is alert and oriented x 3.  SKIN: No obvious rash, lesion, or ulcer.   LABORATORY PANEL:    CBC Recent Labs  Lab 08/28/18 1106  WBC 9.4  HGB 11.3*  HCT 35.6*  PLT 231   ------------------------------------------------------------------------------------------------------------------  Chemistries  Recent Labs  Lab 08/28/18 1106  NA 139  K 4.3  CL 106  CO2 24  GLUCOSE 114*  BUN 33*  CREATININE 1.30*  CALCIUM 8.6*   ------------------------------------------------------------------------------------------------------------------  Cardiac Enzymes No results for input(s): TROPONINI in the last 168 hours. ------------------------------------------------------------------------------------------------------------------  RADIOLOGY:  No results found.  EKG:    IMPRESSION AND PLAN:   Susan House  is a 63 y.o. female with a known history of two diabetes on insulin, hypertension, Gerd, lipid EMEA comes to the emergency room after she was found to be hypoglycemic at Dr. Alben Spittle office. Patient had gone for her husband's appointment. She checked her sugar at home in the morning was 78. She skipped breakfast. She took her morning meds including her insulin. At that [place] office she passed out. They checked the sugar it was 30 and repeat sugar check was 20.  1. Hypoglycemia suspected due to accidental taking increased doses of insulin tresiba that was started three days ago by PCP -patient received IV D10 in the ER. Her sugars have been more than 70. She will get a sandwich box. If she keeps it down sugars okay I will stop D10 drip. -Will check sugars every four hours. -depending on her sugars resume insulin tomorrow  2. Relative soft BP -am holding clonidine and hydralazine -continue amlodipine and losartan  3. Jerrye Bushy continue PPI  4. Hyperlipidemia on statins  Above was discussed with patient and husband.   All the records are reviewed and case discussed with ED provider.   CODE STATUS: full  TOTAL TIME TAKING CARE OF THIS PATIENT: *50* minutes.     [name] M.D on 08/28/2018 at 4:14 PM  Between 7am to 6pm - Pager - [phone]  After 6pm go to [email or web address] - password [email or web address]  [facility]  Office  [phone]  CC: Primary care physician; Jerrol Banana., MD

## 2018-08-28 NOTE — ED Notes (Signed)
D10 rate changed to 26mL/hr

## 2018-08-28 NOTE — ED Notes (Signed)
Dr. Cinda Quest at bedside-aware of cbg 43 Snacks provided to pt with encouragement to eat and drink
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
## 2018-08-28 NOTE — ED Notes (Signed)
Admitting MD made awre of current BG. New verbal received to increase D10 rate to 46ml/hr. Rate increased. Pt eating a peanut butter graham cracker as well.

## 2018-08-28 NOTE — ED Triage Notes (Signed)
Pt via ems from [facility]. She was at a follow up appointment with her husband and began to feel weak. Staff in the MD office checked her blood sugar and it was 32. EMS was called, IV established, and pt got 269ml of D10. Her blood glucose after that was 374. Pt CBG on arrival at ED 107. Pt takes metformin (took this morning) and has recently been started on insulin injections, which she did not take this morning because her CBG was 72. Pt alert & oriented, has left sided weakness from previous stroke (2000).

## 2018-08-28 NOTE — ED Notes (Signed)
Dr Cinda Quest aware of pt heart rate- second EKG obtained and given to EDP

## 2018-08-29 ENCOUNTER — Ambulatory Visit: Payer: Medicare Other | Admitting: Family Medicine

## 2018-08-29 ENCOUNTER — Ambulatory Visit: Payer: Self-pay | Admitting: Family Medicine

## 2018-08-29 DIAGNOSIS — K219 Gastro-esophageal reflux disease without esophagitis: Secondary | ICD-10-CM | POA: Diagnosis not present

## 2018-08-29 DIAGNOSIS — I10 Essential (primary) hypertension: Secondary | ICD-10-CM | POA: Diagnosis not present

## 2018-08-29 DIAGNOSIS — E162 Hypoglycemia, unspecified: Secondary | ICD-10-CM | POA: Diagnosis not present

## 2018-08-29 DIAGNOSIS — E1122 Type 2 diabetes mellitus with diabetic chronic kidney disease: Secondary | ICD-10-CM | POA: Diagnosis not present

## 2018-08-29 LAB — GLUCOSE, CAPILLARY
GLUCOSE-CAPILLARY: 324 mg/dL — AB (ref 70–99)
Glucose-Capillary: 183 mg/dL — ABNORMAL HIGH (ref 70–99)
Glucose-Capillary: 202 mg/dL — ABNORMAL HIGH (ref 70–99)
Glucose-Capillary: 206 mg/dL — ABNORMAL HIGH (ref 70–99)
Glucose-Capillary: 241 mg/dL — ABNORMAL HIGH (ref 70–99)
Glucose-Capillary: 296 mg/dL — ABNORMAL HIGH (ref 70–99)
Glucose-Capillary: 310 mg/dL — ABNORMAL HIGH (ref 70–99)
Glucose-Capillary: 381 mg/dL — ABNORMAL HIGH (ref 70–99)

## 2018-08-29 MED ORDER — ENOXAPARIN SODIUM 40 MG/0.4ML ~~LOC~~ SOLN
40.0000 mg | Freq: Two times a day (BID) | SUBCUTANEOUS | Status: DC
  Administered 2018-08-29 (×2): 40 mg via SUBCUTANEOUS
  Filled 2018-08-29 (×2): qty 0.4

## 2018-08-29 MED ORDER — INSULIN GLARGINE 100 UNIT/ML ~~LOC~~ SOLN
15.0000 [IU] | Freq: Every day | SUBCUTANEOUS | Status: DC
  Administered 2018-08-29: 15 [IU] via SUBCUTANEOUS
  Filled 2018-08-29 (×2): qty 0.15

## 2018-08-29 MED ORDER — INSULIN GLARGINE 100 UNIT/ML ~~LOC~~ SOLN
15.0000 [IU] | Freq: Once | SUBCUTANEOUS | Status: AC
  Administered 2018-08-29: 15 [IU] via SUBCUTANEOUS
  Filled 2018-08-29: qty 0.15

## 2018-08-29 MED ORDER — INSULIN ASPART 100 UNIT/ML ~~LOC~~ SOLN
0.0000 [IU] | Freq: Three times a day (TID) | SUBCUTANEOUS | Status: DC
  Administered 2018-08-29: 9 [IU] via SUBCUTANEOUS
  Administered 2018-08-30: 3 [IU] via SUBCUTANEOUS
  Filled 2018-08-29 (×2): qty 1

## 2018-08-29 MED ORDER — CLONIDINE HCL 0.1 MG PO TABS
0.3000 mg | ORAL_TABLET | Freq: Two times a day (BID) | ORAL | Status: DC
  Administered 2018-08-29 – 2018-08-30 (×2): 0.3 mg via ORAL
  Filled 2018-08-29 (×2): qty 3

## 2018-08-29 MED ORDER — INSULIN GLARGINE 100 UNIT/ML ~~LOC~~ SOLN
30.0000 [IU] | Freq: Every day | SUBCUTANEOUS | Status: DC
  Administered 2018-08-30: 30 [IU] via SUBCUTANEOUS
  Filled 2018-08-29: qty 0.3

## 2018-08-29 MED ORDER — INSULIN ASPART 100 UNIT/ML ~~LOC~~ SOLN
0.0000 [IU] | Freq: Every day | SUBCUTANEOUS | Status: DC
  Administered 2018-08-29: 2 [IU] via SUBCUTANEOUS
  Filled 2018-08-29: qty 1

## 2018-08-29 MED ORDER — HYDRALAZINE HCL 50 MG PO TABS
100.0000 mg | ORAL_TABLET | Freq: Two times a day (BID) | ORAL | Status: DC
  Administered 2018-08-29 – 2018-08-30 (×2): 100 mg via ORAL
  Filled 2018-08-29 (×2): qty 2

## 2018-08-29 MED ORDER — CEPHALEXIN 500 MG PO CAPS
500.0000 mg | ORAL_CAPSULE | Freq: Two times a day (BID) | ORAL | Status: DC
  Administered 2018-08-29 – 2018-08-30 (×3): 500 mg via ORAL
  Filled 2018-08-29 (×3): qty 1

## 2018-08-29 NOTE — Progress Notes (Signed)
PHARMACIST - PHYSICIAN COMMUNICATION  CONCERNING:  Enoxaparin (Lovenox) for DVT Prophylaxis    RECOMMENDATION: Patient was prescribed enoxaprin 40mg  q24 hours for VTE prophylaxis.   Filed Weights   08/28/18 1101  Weight: 228 lb (103.4 kg)    Body mass index is 43.08 kg/m.  Estimated Creatinine Clearance: 49.6 mL/min (A) (by C-G formula based on SCr of 1.3 mg/dL (H)).   Based on [place] patient is candidate for enoxaparin 40mg  every 12 hour dosing due to BMI being >40.  DESCRIPTION: Pharmacy has adjusted enoxaparin dose per [facility] policy.  Patient is now receiving enoxaparin 40mg  every 12 hours.   Dallie Piles, PharmD Clinical Pharmacist  08/29/2018 10:27 AM

## 2018-08-29 NOTE — Progress Notes (Signed)
Inpatient Diabetes Program Recommendations  AACE/ADA: New Consensus Statement on Inpatient Glycemic Control  Target Ranges:  Prepandial:   less than 140 mg/dL      Peak postprandial:   less than 180 mg/dL (1-2 hours)      Critically ill patients:  140 - 180 mg/dL  Results for [name], Statia A "[name]" (MRN [id number]) as of 08/29/2018 11:19  Ref. Range 08/29/2018 00:15 08/29/2018 04:34 08/29/2018 07:33 08/29/2018 10:38  Glucose-Capillary Latest Ref Range: 70 - 99 mg/dL 202 (H) 183 (H) 206 (H) 310 (H)   Results for [name], IVY PURYEAR" (MRN [id number]) as of 08/29/2018 11:19  Ref. Range 08/28/2018 11:33 08/28/2018 11:58 08/28/2018 12:26 08/28/2018 12:59 08/28/2018 13:01 08/28/2018 13:06 08/28/2018 13:44 08/28/2018 14:27 08/28/2018 14:59 08/28/2018 15:42 08/28/2018 17:25 08/28/2018 18:11 08/28/2018 19:10 08/28/2018 21:18  Glucose-Capillary Latest Ref Range: 70 - 99 mg/dL 79 59 (L) 78 230 (H) 68 (L) 103 (H) 69 (L) 60 (L) 80 70 58 (L) 67 (L) 137 (H) 211 (H)   Review of Glycemic Control  Diabetes history: DM2 Outpatient Diabetes medications: Tresiba 20 units daily, Metformin 1000 mg BID Current orders for Inpatient glycemic control: None  Inpatient Diabetes Program Recommendations:  IV fluids: Hypoglycemia resolved and glucose up to 310 mg/dl today at 10:38 am.  Please discontinue D10. Insulin - Basal: Please consider ordering Lantus 15 units Q24H. Correction (SSI): Please consider ordering CBGs ACHS and Novolog 0-9 units TID with meals and Novolog 0-5 units QHS.  NOTE: Per H&P, "patient found to be hypoglycemic at Dr. Alben Spittle office. Patient had gone for her husband's appointment. She checked her sugar at home in the morning was 78. She skipped breakfast. She took her morning meds including her insulin. At that [place] office she passed out. They checked the sugar it was 30 and repeat sugar check was 20. She was into the emergency room.  Apparently patient was started on Insulin tresiba 20 units  daily recently for her uncontrolled sugars. She also takes metformin thousand milligrams BID. Patient apparently was accidentally taking Insulin tresiba 20 units twice a day instead of once a day."    Per chart review, patient seen PCP on 07/29/18 and per office note, "A1c today is 12 which is entirely too high.  Was 9.5.  Patient is unwilling to see endocrinology or consider insulin pump.  This time will switch from 70/30 insulin to [place] starting at 15 units daily at titrating up every 2 days as long as fasting sugars greater than 200."  Thanks, Susan Alderman, RN, MSN, CDE Diabetes Coordinator Inpatient Diabetes Program [phone] (Team Pager from 8am to 5pm)

## 2018-08-29 NOTE — Progress Notes (Signed)
[place] [place] NAME: Susan House    MR#:  [id number]  DATE OF BIRTH:  [birth year]  SUBJECTIVE:  CHIEF COMPLAINT:   Chief Complaint  Patient presents with  . Hypoglycemia    No new complaint this morning.  Sitting up and having breakfast this morning.  No fevers.  Blood sugars trending up.  Resumed insulin regimen. Initially presented with hypoglycemia secondary to decreased p.o. intake.  REVIEW OF SYSTEMS:  Review of Systems  Constitutional: Negative for chills and fever.  HENT: Negative for hearing loss and tinnitus.   Eyes: Negative for blurred vision and double vision.  Respiratory: Negative for cough and hemoptysis.   Cardiovascular: Negative for chest pain and palpitations.  Gastrointestinal: Negative for abdominal pain, diarrhea, heartburn, nausea and vomiting.  Genitourinary: Negative for dysuria and urgency.  Musculoskeletal: Negative for myalgias and neck pain.  Skin: Negative for itching and rash.  Neurological: Negative for dizziness and headaches.  Psychiatric/Behavioral: Negative for depression and hallucinations.    DRUG ALLERGIES:   Allergies  Allergen Reactions  . Morphine And Related Anaphylaxis  . Fentanyl Other (See Comments)    Unknown reaction.  . Reglan [Metoclopramide]     Other reaction(s): Unknown Elevated BP  . Simvastatin Other (See Comments)    Muscle pain  . Betadine [Povidone Iodine] Rash  . Iodine Rash    Other reaction(s): Unknown-not allergic to ct contrast-ars 04/01/18  . Tetracyclines & Related Rash    Other reaction(s): Unknown   VITALS:  Blood pressure (!) 141/69, pulse 79, temperature 98.7 F (37.1 C), temperature source Oral, resp. rate 19, height 5\' 1"  (1.549 m), weight 103.4 kg, SpO2 98 %. PHYSICAL EXAMINATION:  GENERAL:  63 y.o.-year-old patient lying in the bed with no acute distress. Morbid obesity EYES: Pupils equal, round, reactive to light and accommodation.  No scleral icterus. Extraocular muscles intact.  HEENT: Head atraumatic, normocephalic. Oropharynx and nasopharynx clear.  NECK:  Supple, no jugular venous distention. No thyroid enlargement, no tenderness.  LUNGS: Normal breath sounds bilaterally, no wheezing, rales,rhonchi or crepitation. No use of accessory muscles of respiration.  CARDIOVASCULAR: S1, S2 normal. No murmurs, rubs, or gallops.  ABDOMEN: Soft, nontender, nondistended. Bowel sounds present. No organomegaly or mass.  EXTREMITIES: No pedal edema, cyanosis, or clubbing.  NEUROLOGIC: Cranial nerves II through XII are intact. Muscle strength 5/5 in all extremities. Sensation intact. Gait not checked.  PSYCHIATRIC: The patient is alert and oriented x 3.  SKIN: No obvious rash, lesion, or ulcer.    LABORATORY PANEL:  Female CBC Recent Labs  Lab 08/28/18 1106  WBC 9.4  HGB 11.3*  HCT 35.6*  PLT 231   ------------------------------------------------------------------------------------------------------------------ Chemistries  Recent Labs  Lab 08/28/18 1106  NA 139  K 4.3  CL 106  CO2 24  GLUCOSE 114*  BUN 33*  CREATININE 1.30*  CALCIUM 8.6*   RADIOLOGY:  No results found. ASSESSMENT AND PLAN:   Susan House  is a 63 y.o. female with a known history of two diabetes on insulin, hypertension, Jerrye Bushy, who presented to the emergency room after she was found to be hypoglycemic at Dr. Alben Spittle office. Patient had gone for her husband's appointment. She checked her sugar at home in the morning was 78. She skipped breakfast. She took her morning meds including her insulin. At that [place] office she passed out. They checked the sugar it was 30 and repeat sugar check was 20.  1.   Syncope  secondary to hypoglycemia.   Patient admitted to decreased p.o. intake.  Appears to have skipped breakfast.  And then took her insulin.   Found to have blood sugar as low as 20.  Started on D10 which was weaned off this morning due  to blood sugars running high.   Resumed insulin regimen and most recent blood sugar still greater than 300.  Increasing insulin dose to home dose and monitor blood sugars for next 24 hours to ensure no recurrent hypoglycemia and initial blood sugar better controlled and stable before discharge  Glycosylated hemoglobin level in a.m.  2.   Hypertension  Blood pressure beginning to trend up.  Resume home dose of clonidine and hydralazine.  Continue Norvasc and metoprolol.  Monitor   3. Jerrye Bushy continue PPI  4. Hyperlipidemia on statins  5.  Recent urinary tract infection with urine culture growing Klebsiella pneumonia Home dose of Keflex resumed to complete treatment duration based on sensitivities  DVT prophylaxis; Lovenox   All the records are reviewed and case discussed with Care Management/Social Worker. Management plans discussed with the patient, family and they are in agreement.  CODE STATUS: Full Code  TOTAL TIME TAKING CARE OF THIS PATIENT: 26 minutes.   More than 50% of the time was spent in counseling/coordination of care: YES  POSSIBLE D/C IN 1 DAY, DEPENDING ON CLINICAL CONDITION.   Susan House M.D on 08/29/2018 at 3:34 PM  Between 7am to 6pm - Pager - [phone]  After 6pm go to [email or web address] - password [email or web address]  [facility]  Office  [phone]  CC: Primary care physician; Jerrol Banana., MD  Note: This dictation was prepared with Dragon dictation along with smaller phrase technology. Any transcriptional errors that result from this process are unintentional.

## 2018-08-30 DIAGNOSIS — K219 Gastro-esophageal reflux disease without esophagitis: Secondary | ICD-10-CM | POA: Diagnosis not present

## 2018-08-30 DIAGNOSIS — I10 Essential (primary) hypertension: Secondary | ICD-10-CM | POA: Diagnosis not present

## 2018-08-30 DIAGNOSIS — E1122 Type 2 diabetes mellitus with diabetic chronic kidney disease: Secondary | ICD-10-CM | POA: Diagnosis not present

## 2018-08-30 DIAGNOSIS — E162 Hypoglycemia, unspecified: Secondary | ICD-10-CM | POA: Diagnosis not present

## 2018-08-30 LAB — BASIC METABOLIC PANEL
ANION GAP: 6 (ref 5–15)
BUN: 28 mg/dL — ABNORMAL HIGH (ref 8–23)
CO2: 26 mmol/L (ref 22–32)
Calcium: 8.7 mg/dL — ABNORMAL LOW (ref 8.9–10.3)
Chloride: 105 mmol/L (ref 98–111)
Creatinine, Ser: 1.07 mg/dL — ABNORMAL HIGH (ref 0.44–1.00)
GFR calc Af Amer: >60 mL/min (ref >60)
GFR calc non Af Amer: 56 mL/min — ABNORMAL LOW (ref >60)
Glucose, Bld: 231 mg/dL — ABNORMAL HIGH (ref 70–99)
Potassium: 4.1 mmol/L (ref 3.5–5.1)
Sodium: 137 mmol/L (ref 135–145)

## 2018-08-30 LAB — GLUCOSE, CAPILLARY: Glucose-Capillary: 231 mg/dL — ABNORMAL HIGH (ref 70–99)

## 2018-08-30 LAB — MAGNESIUM: Magnesium: 1.4 mg/dL — ABNORMAL LOW (ref 1.7–2.4)

## 2018-08-30 LAB — HEMOGLOBIN A1C
HEMOGLOBIN A1C: 10.5 % — AB (ref 4.8–5.6)
Mean Plasma Glucose: 254.65 mg/dL

## 2018-08-30 MED ORDER — MAGNESIUM SULFATE 2 GM/50ML IV SOLN
2.0000 g | Freq: Once | INTRAVENOUS | Status: AC
  Administered 2018-08-30: 2 g via INTRAVENOUS
  Filled 2018-08-30: qty 50

## 2018-08-30 MED ORDER — HYDRALAZINE HCL 20 MG/ML IJ SOLN
10.0000 mg | INTRAMUSCULAR | Status: DC | PRN
  Administered 2018-08-30: 10 mg via INTRAVENOUS
  Filled 2018-08-30: qty 1

## 2018-08-30 NOTE — Discharge Summary (Signed)
[place] [facility] [place] NAME: Susan House    MR#:  [id number]  DATE OF BIRTH:  07/22/1955  DATE OF ADMISSION:  08/28/2018   ADMITTING PHYSICIAN: [name], MD  DATE OF DISCHARGE: 08/30/2018  PRIMARY CARE PHYSICIAN: Jerrol Banana., MD   ADMISSION DIAGNOSIS:  Hypoglycemia [E16.2] DISCHARGE DIAGNOSIS:  Active Problems:   Hypoglycemia  SECONDARY DIAGNOSIS:   Past Medical History:  Diagnosis Date  . Allergy   . Arthritis   . Cellulitis and abscess of face   . Depression   . Diabetes mellitus ([place])   . Edema   . GERD (gastroesophageal reflux disease)   . Hematuria   . History of echocardiogram    2008: normal, 2011: normal LVSF  . History of nuclear stress test    a. 12/2009: lexiscan - negative  . Hyperlipidemia   . Hypertension   . IBS (irritable bowel syndrome)   . Migraine   . Morbid obesity ([place])   . Nocturia   . Stroke ([place]) 2000  . TIA (transient ischemic attack) 2010  . Urgency of micturation   . Urinary frequency   . Urinary incontinence    HOSPITAL COURSE:   Chief complaint; passing out due to low blood sugar  History of presenting complaint; Susan House  is a 63 y.o. female with a known history of two diabetes on insulin, hypertension, Gerd, lipid EME[employer] comes to the emergency room after she was found to be hypoglycemic at Dr. Alben Spittle office. Patient had gone for her husband's appointment. She checked her sugar at home in the morning was 78. She skipped breakfast. She took her morning meds including her insulin. At that [place] office she passed out. They checked the sugar it was 30 and repeat sugar check was 20. She was into the emergency room. She was started on dextrose 10% drip.  Please refer to the H&P dictated for further details   Hospital course; 1.  Syncope secondary to hypoglycemia.   Patient admitted to decreased p.o. intake.  Appears to have skipped breakfast.  And then took her  insulin.   Found to have blood sugar as low as 20.  Started on D10 which was weaned off yesterday.  Blood sugar started trending up and home insulin regimen has been resumed.  Blood sugars remain fairly controlled.  Hypoglycemia was due to decreased p.o. intake and patient not having breakfast on the day of admission.  Clinically and hemodynamically stable.  Back to baseline.  Glycosylated hemoglobin level of 10.5.  Outpatient monitoring and adjustment of insulin regimen by primary care physician  2.  Hypertension  Blood pressure better controlled.  Resume home regimen.  Continue the same and follow-up with primary care physician  3.Gerd continue PPI  4.Hyperlipidemia on statins  5.  Recent urinary tract infection with urine culture growing Klebsiella pneumonia Home dose of Keflex resumed to complete treatment duration based on sensitivities.  To complete the same on discharge  DISCHARGE CONDITIONS:  Stable CONSULTS OBTAINED:   DRUG ALLERGIES:   Allergies  Allergen Reactions  . Morphine And Related Anaphylaxis  . Fentanyl Other (See Comments)    Unknown reaction.  . Reglan [Metoclopramide]     Other reaction(s): Unknown Elevated BP  . Simvastatin Other (See Comments)    Muscle pain  . Betadine [Povidone Iodine] Rash  . Iodine Rash    Other reaction(s): Unknown-not allergic to ct contrast-ars 04/01/18  . Tetracyclines & Related Rash  Other reaction(s): Unknown   DISCHARGE MEDICATIONS:   Allergies as of 08/30/2018      Reactions   Morphine And Related Anaphylaxis   Fentanyl Other (See Comments)   Unknown reaction.   Reglan [metoclopramide]    Other reaction(s): Unknown Elevated BP   Simvastatin Other (See Comments)   Muscle pain   Betadine [povidone Iodine] Rash   Iodine Rash   Other reaction(s): Unknown-not allergic to ct contrast-ars 04/01/18   Tetracyclines & Related Rash   Other reaction(s): Unknown      Medication List    TAKE these medications     ACCU-CHEK AVIVA PLUS w/Device Kit Check sugar three times daily DX E11.9-needs a meter   accu-chek softclix lancets Check sugar three times daily DX E11.9   ACCU-CHEK SOFTCLIX LANCETS lancets CHECK BLOOD SUGAR THREE TIMES DAILY   amLODipine 10 MG tablet Commonly known as:  NORVASC Take 1 tablet by mouth daily   atorvastatin 20 MG tablet Commonly known as:  LIPITOR Take 1 tablet (20 mg total) by mouth daily.   cephALEXin 500 MG capsule Commonly known as:  KEFLEX Take 1 capsule (500 mg total) by mouth 2 (two) times daily.   cloNIDine 0.3 MG tablet Commonly known as:  CATAPRES Take 1 tablet (0.3 mg total) by mouth 2 (two) times daily.   clopidogrel 75 MG tablet Commonly known as:  PLAVIX Take 1 tablet (75 mg total) by mouth daily.   enalapril 20 MG tablet Commonly known as:  VASOTEC Take 1 tablet (20 mg total) by mouth 2 (two) times daily.   fluconazole 150 MG tablet Commonly known as:  DIFLUCAN Take 1 tablet (150 mg total) by mouth daily.   FREESTYLE LIBRE 14 DAY READER Devi LENGTH OF NEED: LIFETIME - UNLESS SPECIFIED OTHERWISE   FREESTYLE LIBRE 14 DAY SENSOR Misc LENGTH OF NEED: LIFETIME - UNLESS SPECIFIED OTHERWISE   glucose blood test strip Commonly known as:  ACCU-CHEK AVIVA PLUS TEST BLOOD SUGAR THREE TIMES DAILY   hydrALAZINE 100 MG tablet Commonly known as:  APRESOLINE Take 1 tablet (100 mg total) by mouth 2 (two) times daily.   HYDROcodone-acetaminophen 10-325 MG tablet Commonly known as:  NORCO Take 1-2 tablets by mouth every 6 (six) hours as needed.   insulin degludec 100 UNIT/ML Sopn FlexTouch Pen Commonly known as:  TRESIBA FLEXTOUCH Up to 30 units daily. What changed:    how much to take  how to take this  when to take this  additional instructions   levETIRAcetam 500 MG tablet Commonly known as:  KEPPRA Take 1 tablet (500 mg total) by mouth 2 (two) times daily. 500 mg bid.   metFORMIN 1000 MG tablet Commonly known as:   GLUCOPHAGE Take 1 tablet (1,000 mg total) by mouth 2 (two) times daily.   metoprolol succinate 100 MG 24 hr tablet Commonly known as:  TOPROL-XL Take 1 tablet (100 mg total) by mouth daily.   nitroGLYCERIN 0.4 MG SL tablet Commonly known as:  NITROSTAT Place 1 tablet (0.4 mg total) under the tongue every 5 (five) minutes as needed for chest pain.   oxybutynin 5 MG tablet Commonly known as:  DITROPAN Take 1 tablet (5 mg total) by mouth 2 (two) times daily.   oxyCODONE 5 MG immediate release tablet Commonly known as:  Oxy IR/ROXICODONE Take 1 tablet (5 mg total) by mouth every 4 (four) hours as needed for severe pain.   pantoprazole 40 MG tablet Commonly known as:  PROTONIX Take 1 tablet (40 mg  total) by mouth daily.        DISCHARGE INSTRUCTIONS:   DIET:  Diabetic diet DISCHARGE CONDITION:  Stable ACTIVITY:  Activity as tolerated OXYGEN:  Home Oxygen: No.  Oxygen Delivery: room air DISCHARGE LOCATION:  home   If you experience worsening of your admission symptoms, develop shortness of breath, life threatening emergency, suicidal or homicidal thoughts you must seek medical attention immediately by calling 911 or calling your MD immediately  if symptoms less severe.  You Must read complete instructions/literature along with all the possible adverse reactions/side effects for all the Medicines you take and that have been prescribed to you. Take any new Medicines after you have completely understood and accpet all the possible adverse reactions/side effects.   Please note  You were cared for by a hospitalist during your hospital stay. If you have any questions about your discharge medications or the care you received while you were in the hospital after you are discharged, you can call the unit and asked to speak with the hospitalist on call if the hospitalist that took care of you is not available. Once you are discharged, your primary care physician will handle any further  medical issues. Please note that NO REFILLS for any discharge medications will be authorized once you are discharged, as it is imperative that you return to your primary care physician (or establish a relationship with a primary care physician if you do not have one) for your aftercare needs so that they can reassess your need for medications and monitor your lab values.    On the day of Discharge:  VITAL SIGNS:  Blood pressure (!) 128/50, pulse (!) 57, temperature 98.4 F (36.9 C), temperature source Oral, resp. rate 20, height '5\' 1"'$  (1.549 m), weight 103.4 kg, SpO2 100 %. PHYSICAL EXAMINATION:  GENERAL:  63 y.o.-year-old patient lying in the bed with no acute distress.  EYES: Pupils equal, round, reactive to light and accommodation. No scleral icterus. Extraocular muscles intact.  HEENT: Head atraumatic, normocephalic. Oropharynx and nasopharynx clear.  NECK:  Supple, no jugular venous distention. No thyroid enlargement, no tenderness.  LUNGS: Normal breath sounds bilaterally, no wheezing, rales,rhonchi or crepitation. No use of accessory muscles of respiration.  CARDIOVASCULAR: S1, S2 normal. No murmurs, rubs, or gallops.  ABDOMEN: Soft, non-tender, non-distended. Bowel sounds present. No organomegaly or mass.  EXTREMITIES: No pedal edema, cyanosis, or clubbing.  NEUROLOGIC: Cranial nerves II through XII are intact. Muscle strength 5/5 in all extremities. Sensation intact. Gait not checked.  PSYCHIATRIC: The patient is alert and oriented x 3.  SKIN: No obvious rash, lesion, or ulcer.  DATA REVIEW:   CBC Recent Labs  Lab 08/28/18 1106  WBC 9.4  HGB 11.3*  HCT 35.6*  PLT 231    Chemistries  Recent Labs  Lab 08/30/18 0515  NA 137  K 4.1  CL 105  CO2 26  GLUCOSE 231*  BUN 28*  CREATININE 1.07*  CALCIUM 8.7*  MG 1.4*     Microbiology Results  Results for orders placed or performed in visit on 08/23/18  Urine Culture     Status: Abnormal   Collection Time: 08/23/18   2:53 PM  Result Value Ref Range Status   Urine Culture, Routine Final report (A)  Final   Organism ID, Bacteria Klebsiella pneumoniae (A)  Final    Comment: Greater than 100,000 colony forming units per mL Cefazolin <=4 ug/mL Cefazolin with an MIC <=16 predicts susceptibility to the oral agents cefaclor, cefdinir, cefpodoxime,  cefprozil, cefuroxime, cephalexin, and loracarbef when used for therapy of uncomplicated urinary tract infections due to E. coli, Klebsiella pneumoniae, and Proteus mirabilis.    Antimicrobial Susceptibility Comment  Final    Comment:       ** S = Susceptible; I = Intermediate; R = Resistant **                    P = Positive; N = Negative             MICS are expressed in micrograms per mL    Antibiotic                 RSLT#1    RSLT#2    RSLT#3    RSLT#4 Amoxicillin/Clavulanic Acid    S Ampicillin                     R Cefepime                       S Ceftriaxone                    S Cefuroxime                     S Ciprofloxacin                  S Ertapenem                      S Gentamicin                     S Imipenem                       S Levofloxacin                   S Meropenem                      S Nitrofurantoin                 S Piperacillin/Tazobactam        S Tetracycline                   S Tobramycin                     S Trimethoprim/Sulfa             S     RADIOLOGY:  No results found.   Management plans discussed with the patient, family and they are in agreement.  CODE STATUS: Full Code   TOTAL TIME TAKING CARE OF THIS PATIENT: 26 minutes.    Susan House M.D on 08/30/2018 at 10:27 AM  Between 7am to 6pm - Pager - [phone]  After 6pm go to [email or web address] - password [email or web address]  [employer] [facility] [place] [facility]  Office  [phone]  CC: Primary care physician; Jerrol Banana., MD   Note: This dictation was prepared with Dragon dictation along with smaller phrase technology. Any transcriptional errors  that result from this process are unintentional.

## 2018-08-31 NOTE — Progress Notes (Signed)
Patient's husband was being seen and he says she did not sleep all night.  First she was answering questions and then became unresponsive but, only to painful stimuli.  There was checked and it was 30, she was given oral glucose and blood sugar recheck was 26.  EMS was called and she was sent to the ED.  She was much improved for she left.  Blood sugar was 36 on the third check.

## 2018-09-02 ENCOUNTER — Telehealth: Payer: Self-pay

## 2018-09-02 NOTE — Telephone Encounter (Signed)
Transition Care Management Follow-up Telephone Call  Date of discharge and from where: [facility] on 08/30/18.  How have you been since you were released from the hospital? Doing ok. Blood sugar was 222 around noon yesterday and pt held off on her Insulin. Then around 7 she checked again and it was 314. Pt took 10 units of Insulin at that time. Pt has not checked her BS again today. Pt states she has felt fatigued since this episode began and she has not recovered from it yet. Pt plans to try an do some walking today. Advised her to be careful and discussed fall prevention. Declines fever, SOB or n/v/d.  Any questions or concerns? No   Items Reviewed:  Did the pt receive and understand the discharge instructions provided? Yes   Medications obtained and verified? No, pt declines at this time.  Any new allergies since your discharge? No   Dietary orders reviewed? Yes  Do you have support at home? Yes   Other (ie: DME, Home Health, etc) N/A  Functional Questionnaire: (I = Independent and D = Dependent)  Bathing/Dressing- I   Meal Prep- I  Eating- I  Maintaining continence- I, wears protection daily.   Transferring/Ambulation- Uses a cane at all times and has a walker and wheelchair as needed.  Managing Meds- I   Follow up appointments reviewed:    PCP Hospital f/u appt confirmed? Yes  Scheduled to see Dr. Rosanna Randy on 09/04/18 @ 1:40 PM.  [facility] f/u appt confirmed? No    Are transportation arrangements needed? No   If their condition worsens, is the pt aware to call  their PCP or go to the ED? Yes  Was the patient provided with contact information for the PCP's office or ED? Yes  Was the pt encouraged to call back with questions or concerns? Yes

## 2018-09-03 ENCOUNTER — Ambulatory Visit: Payer: Self-pay

## 2018-09-03 DIAGNOSIS — I10 Essential (primary) hypertension: Secondary | ICD-10-CM

## 2018-09-03 DIAGNOSIS — E118 Type 2 diabetes mellitus with unspecified complications: Secondary | ICD-10-CM

## 2018-09-03 NOTE — Chronic Care Management (AMB) (Signed)
  Chronic Care Management   Note  09/03/2018 Name: Susan House MRN: [id number] DOB: [birth year]  Susan House Susan House is a 63 year old female who sees Dr. Miguel Aschoff for primary care. Dr. Rosanna Randy asked the CCM team to consult the patient for Diabetes management secondary to her disease being uncontrolled with multiple complications. Patient has a history of but not limited to CVA with hemiplegia, HTN, CKD, DM uncontrolled with complications, and depression. Referral was placed 08/28/2018. Patient's last office visit was 08/28/2018 where she was treated for severe hypoglycemia and transported to the ED for admission.Telephone outreach to patient today to introduce CCM services.  Susan House was given information about Chronic Care Management services today including:  1. CCM service includes personalized support from designated clinical staff supervised by her physician, including individualized plan of care and coordination with other care providers 2. 24/7 contact phone numbers for assistance for urgent and routine care needs. 3. Service will only be billed when office clinical staff spend 20 minutes or more in a month to coordinate care. 4. Only one practitioner may furnish and bill the service in a calendar month. 5. The patient may stop CCM services at any time (effective at the end of the month) by phone call to the office staff. 6. The patient will be responsible for cost sharing (co-pay) of up to 20% of the service fee (after annual deductible is met).  Patient agreed to services and verbal consent obtained.      Plan: CCM Team will meet with patient face to face 09/09/2018  Susan House E. Rollene Rotunda, RN, BSN Nurse Care Coordinator [facility] Care Management [phone]

## 2018-09-04 ENCOUNTER — Encounter: Payer: Self-pay | Admitting: Family Medicine

## 2018-09-04 ENCOUNTER — Other Ambulatory Visit: Payer: Self-pay

## 2018-09-04 ENCOUNTER — Ambulatory Visit (INDEPENDENT_AMBULATORY_CARE_PROVIDER_SITE_OTHER): Payer: Medicare Other | Admitting: Family Medicine

## 2018-09-04 VITALS — BP 132/58 | HR 61 | Temp 97.8°F | Ht 61.0 in | Wt 240.4 lb

## 2018-09-04 DIAGNOSIS — M5416 Radiculopathy, lumbar region: Secondary | ICD-10-CM

## 2018-09-04 DIAGNOSIS — Z7409 Other reduced mobility: Secondary | ICD-10-CM

## 2018-09-04 DIAGNOSIS — N183 Chronic kidney disease, stage 3 unspecified: Secondary | ICD-10-CM

## 2018-09-04 DIAGNOSIS — E0822 Diabetes mellitus due to underlying condition with diabetic chronic kidney disease: Secondary | ICD-10-CM | POA: Diagnosis not present

## 2018-09-04 DIAGNOSIS — I69354 Hemiplegia and hemiparesis following cerebral infarction affecting left non-dominant side: Secondary | ICD-10-CM

## 2018-09-04 DIAGNOSIS — E118 Type 2 diabetes mellitus with unspecified complications: Secondary | ICD-10-CM

## 2018-09-04 DIAGNOSIS — Z6841 Body Mass Index (BMI) 40.0 and over, adult: Secondary | ICD-10-CM

## 2018-09-04 NOTE — Progress Notes (Signed)
Patient: Susan House Female    DOB: July 16, 1956   63 y.o.   MRN: [id number] Visit Date: 09/04/2018  Today's Provider: Wilhemena Durie, MD   Chief Complaint  Patient presents with  . Hospitalization Follow-up    low blood sugars  . Back Pain    this morning when pt went to sit up out of bed she heard something crack and she is having pain in middle of back.   Subjective:     HPI   Follow up Hospitalization This is a transition of care visit.  Patient was taking [place] twice a day.  She understands now it is a once a day drug.  She needs more understanding of her diabetes. Patient was admitted to [facility] on 08/28/18 and discharged on 08/30/18 She was treated for hypoglycemia. Treatment for this included IV dextrose to try to elevate blood sugars. Telephone follow up was done on  She reports good compliance with treatment. She reports this condition is Improved.  ------------------------------------------------------------------------------------     Allergies  Allergen Reactions  . Morphine And Related Anaphylaxis  . Fentanyl Other (See Comments)    Unknown reaction.  . Reglan [Metoclopramide]     Other reaction(s): Unknown Elevated BP  . Simvastatin Other (See Comments)    Muscle pain  . Betadine [Povidone Iodine] Rash  . Iodine Rash    Other reaction(s): Unknown-not allergic to ct contrast-ars 04/01/18  . Tetracyclines & Related Rash    Other reaction(s): Unknown     Current Outpatient Medications:  .  ACCU-CHEK SOFTCLIX LANCETS lancets, CHECK BLOOD SUGAR THREE TIMES DAILY, Disp: 300 each, Rfl: 3 .  amLODipine (NORVASC) 10 MG tablet, Take 1 tablet by mouth daily, Disp: 90 tablet, Rfl: 2 .  atorvastatin (LIPITOR) 20 MG tablet, Take 1 tablet (20 mg total) by mouth daily., Disp: 90 tablet, Rfl: 3 .  Blood Glucose Monitoring Suppl (ACCU-CHEK AVIVA PLUS) w/Device KIT, Check sugar three times daily DX E11.9-needs a meter, Disp: 1 kit, Rfl: 0 .   cephALEXin (KEFLEX) 500 MG capsule, Take 1 capsule (500 mg total) by mouth 2 (two) times daily., Disp: 14 capsule, Rfl: 0 .  cloNIDine (CATAPRES) 0.3 MG tablet, Take 1 tablet (0.3 mg total) by mouth 2 (two) times daily., Disp: 60 tablet, Rfl: 12 .  clopidogrel (PLAVIX) 75 MG tablet, Take 1 tablet (75 mg total) by mouth daily., Disp: 90 tablet, Rfl: 3 .  Continuous Blood Gluc Receiver (FREESTYLE LIBRE 14 DAY READER) DEVI, LENGTH OF NEED: LIFETIME - UNLESS SPECIFIED OTHERWISE, Disp: 2 Device, Rfl: 11 .  Continuous Blood Gluc Sensor (FREESTYLE LIBRE 14 DAY SENSOR) MISC, LENGTH OF NEED: LIFETIME - UNLESS SPECIFIED OTHERWISE, Disp: 2 each, Rfl: 0 .  enalapril (VASOTEC) 20 MG tablet, Take 1 tablet (20 mg total) by mouth 2 (two) times daily., Disp: 180 tablet, Rfl: 2 .  fluconazole (DIFLUCAN) 150 MG tablet, Take 1 tablet (150 mg total) by mouth daily., Disp: 2 tablet, Rfl: 0 .  glucose blood (ACCU-CHEK AVIVA PLUS) test strip, TEST BLOOD SUGAR THREE TIMES DAILY, Disp: 300 each, Rfl: 3 .  hydrALAZINE (APRESOLINE) 100 MG tablet, Take 1 tablet (100 mg total) by mouth 2 (two) times daily., Disp: 60 tablet, Rfl: 11 .  HYDROcodone-acetaminophen (NORCO) 10-325 MG tablet, Take 1-2 tablets by mouth every 6 (six) hours as needed., Disp: 100 tablet, Rfl: 0 .  insulin degludec (TRESIBA FLEXTOUCH) 100 UNIT/ML SOPN FlexTouch Pen, Up to 30 units daily. (Patient taking  differently: Inject 20 Units into the skin daily. ), Disp: 3 pen, Rfl: 11 .  Lancet Devices (ACCU-CHEK SOFTCLIX) lancets, Check sugar three times daily DX E11.9, Disp: 100 each, Rfl: 3 .  levETIRAcetam (KEPPRA) 500 MG tablet, Take 1 tablet (500 mg total) by mouth 2 (two) times daily. 500 mg bid., Disp: 180 tablet, Rfl: 3 .  metFORMIN (GLUCOPHAGE) 1000 MG tablet, Take 1 tablet (1,000 mg total) by mouth 2 (two) times daily., Disp: 180 tablet, Rfl: 3 .  metoprolol succinate (TOPROL-XL) 100 MG 24 hr tablet, Take 1 tablet (100 mg total) by mouth daily., Disp: 90  tablet, Rfl: 3 .  nitroGLYCERIN (NITROSTAT) 0.4 MG SL tablet, Place 1 tablet (0.4 mg total) under the tongue every 5 (five) minutes as needed for chest pain., Disp: 25 tablet, Rfl: 0 .  oxybutynin (DITROPAN) 5 MG tablet, Take 1 tablet (5 mg total) by mouth 2 (two) times daily., Disp: 180 tablet, Rfl: 3 .  oxyCODONE (OXY IR/ROXICODONE) 5 MG immediate release tablet, Take 1 tablet (5 mg total) by mouth every 4 (four) hours as needed for severe pain., Disp: 30 tablet, Rfl: 0 .  pantoprazole (PROTONIX) 40 MG tablet, Take 1 tablet (40 mg total) by mouth daily., Disp: 90 tablet, Rfl: 2  Review of Systems  Constitutional: Negative.   Eyes: Negative.   Respiratory: Negative.   Cardiovascular: Negative.   Gastrointestinal: Negative.   Endocrine: Negative.   Genitourinary: Negative.   Musculoskeletal: Positive for back pain (felt something crack when sitting up out of bed this morning).  Skin: Negative.   Allergic/Immunologic: Negative.   Neurological: Negative.   Hematological: Negative.   Psychiatric/Behavioral: Negative.     Social History   Tobacco Use  . Smoking status: Never Smoker  . Smokeless tobacco: Never Used  Substance Use Topics  . Alcohol use: No      Objective:   BP (!) 132/58 (BP Location: Right Arm, Patient Position: Sitting, Cuff Size: Normal)   Pulse 61   Temp 97.8 F (36.6 C) (Oral)   Ht 5' 1"  (1.549 m)   Wt 240 lb 6.4 oz (109 kg)   SpO2 98%   BMI 45.42 kg/m  Vitals:   09/04/18 1359  BP: (!) 132/58  Pulse: 61  Temp: 97.8 F (36.6 C)  TempSrc: Oral  SpO2: 98%  Weight: 240 lb 6.4 oz (109 kg)  Height: 5' 1"  (1.549 m)     Physical Exam Vitals signs reviewed.  Constitutional:      Appearance: She is well-developed.  HENT:     Head: Normocephalic and atraumatic.     Right Ear: External ear normal.     Left Ear: External ear normal.     Nose: Nose normal.  Eyes:     General: No scleral icterus.    Conjunctiva/sclera: Conjunctivae normal.  Neck:      Thyroid: No thyromegaly.  Cardiovascular:     Rate and Rhythm: Normal rate and regular rhythm.  Pulmonary:     Effort: Pulmonary effort is normal.     Breath sounds: Normal breath sounds.  Abdominal:     Palpations: Abdomen is soft.  Musculoskeletal:        General: Tenderness present.     Comments: Tenderness of paraspinal area of lower thoracic area and upper lumbar spine. No  Focal tenderness over any vertebral body.  Skin:    General: Skin is warm and dry.     Comments: Eczematous lesion anterior left auricle with tenderness and possible  secondary mild cellulitis.  Neurological:     Mental Status: She is alert and oriented to person, place, and time. Mental status is at baseline.     Motor: Weakness present.  Psychiatric:        Behavior: Behavior normal.        Thought Content: Thought content normal.        Judgment: Judgment normal.         Assessment & Plan    1. Left lumbar radiculitis Need referral or further evaluation.  OTC Voltaren gel. - oxyCODONE (OXY IR/ROXICODONE) 5 MG immediate release tablet; Take 1 tablet (5 mg total) by mouth every 4 (four) hours as needed for severe pain.  Dispense: 30 tablet; Refill: 0  2. Type 2 diabetes mellitus with complications ([place]) Gust with patient and I would like to refer her to endocrinology.  She wishes to try chronic care management first to see if she can get some counseling regarding appropriate care.  3. Diabetes mellitus due to underlying condition with stage 3 chronic kidney disease, without long-term current use of insulin (HCC)   4. Spastic hemiplegia of left nondominant side as late effect of cerebral infarction ([place])   5. Poor mobility Patient is high fall risk.  consider reevaluation for physical therapy referral.  6. Class 3 severe obesity due to excess calories with serious comorbidity and body mass index (BMI) of 40.0 to 44.9 in adult [facility])     I have done the exam and reviewed the above chart and it  is accurate to the best of my knowledge. Development worker, community has been used in this note in any air is in the dictation or transcription are unintentional.  I have done the exam and reviewed the above chart and it is accurate to the best of my knowledge. Development worker, community has been used in this note in any air is in the dictation or transcription are unintentional.  Wilhemena Durie, MD  [place]

## 2018-09-04 NOTE — Patient Instructions (Signed)
1. Thank You for allowing the CCM (Chronic Care Management) Team to assist you with your healthcare goals!! We look forward to meeting you on 09/09/2018 at 10:00 2. Please bring ALL medications to your appointment! If you have a blood sugar meter or a blood pressure monitor at home, bring those as well.  3.  Contact the CCM Team if you have any question or need to reschedule your initial visit.  CCM (Chronic Care Management) Team   Trish Fountain RN, BSN Nurse Care Coordinator  [phone]  Ruben Reason PharmD  Clinical Pharmacist  [phone]   [name] was given information about Chronic Care Management services today including:  1. CCM service includes personalized support from designated clinical staff supervised by her physician, including individualized plan of care and coordination with other care providers 2. 24/7 contact phone numbers for assistance for urgent and routine care needs. 3. Service will only be billed when office clinical staff spend 20 minutes or more in a month to coordinate care. 4. Only one practitioner may furnish and bill the service in a calendar month. 5. The patient may stop CCM services at any time (effective at the end of the month) by phone call to the office staff. 6. The patient will be responsible for cost sharing (co-pay) of up to 20% of the service fee (after annual deductible is met).  Patient agreed to services and verbal consent obtained.

## 2018-09-05 ENCOUNTER — Other Ambulatory Visit: Payer: Self-pay | Admitting: Family Medicine

## 2018-09-05 MED ORDER — HYDROCODONE-ACETAMINOPHEN 10-325 MG PO TABS: 1.0000 | ORAL_TABLET | Freq: Four times a day (QID) | ORAL | 0 refills | Status: DC | PRN

## 2018-09-05 NOTE — Telephone Encounter (Signed)
Pt needing a refill on: HYDROcodone-acetaminophen (NORCO) 10-325 MG tablet  Pt stating her back is hurting worse today.  Please call into: [id number] [address], [place] [phone] (Phone) [phone] (Fax)   Thanks, [place]

## 2018-09-07 MED ORDER — OXYCODONE HCL 5 MG PO TABS: 5.0000 mg | ORAL_TABLET | ORAL | 0 refills | Status: DC | PRN

## 2018-09-09 ENCOUNTER — Ambulatory Visit (INDEPENDENT_AMBULATORY_CARE_PROVIDER_SITE_OTHER): Payer: Medicare Other | Admitting: Family Medicine

## 2018-09-09 ENCOUNTER — Ambulatory Visit: Payer: Self-pay

## 2018-09-09 ENCOUNTER — Encounter: Payer: Self-pay | Admitting: Family Medicine

## 2018-09-09 VITALS — BP 142/78 | HR 62 | Temp 98.4°F | Resp 20 | Wt 239.0 lb

## 2018-09-09 DIAGNOSIS — N183 Chronic kidney disease, stage 3 (moderate): Secondary | ICD-10-CM

## 2018-09-09 DIAGNOSIS — E1122 Type 2 diabetes mellitus with diabetic chronic kidney disease: Secondary | ICD-10-CM | POA: Diagnosis not present

## 2018-09-09 DIAGNOSIS — W19XXXA Unspecified fall, initial encounter: Secondary | ICD-10-CM

## 2018-09-09 DIAGNOSIS — Z6841 Body Mass Index (BMI) 40.0 and over, adult: Secondary | ICD-10-CM

## 2018-09-09 DIAGNOSIS — E66813 Obesity, class 3: Secondary | ICD-10-CM

## 2018-09-09 DIAGNOSIS — M25511 Pain in right shoulder: Secondary | ICD-10-CM | POA: Diagnosis not present

## 2018-09-09 DIAGNOSIS — E118 Type 2 diabetes mellitus with unspecified complications: Secondary | ICD-10-CM

## 2018-09-09 DIAGNOSIS — Z7409 Other reduced mobility: Secondary | ICD-10-CM

## 2018-09-09 DIAGNOSIS — I69354 Hemiplegia and hemiparesis following cerebral infarction affecting left non-dominant side: Secondary | ICD-10-CM

## 2018-09-09 DIAGNOSIS — I10 Essential (primary) hypertension: Secondary | ICD-10-CM

## 2018-09-09 DIAGNOSIS — M62838 Other muscle spasm: Secondary | ICD-10-CM

## 2018-09-09 DIAGNOSIS — I739 Peripheral vascular disease, unspecified: Secondary | ICD-10-CM

## 2018-09-09 DIAGNOSIS — I63419 Cerebral infarction due to embolism of unspecified middle cerebral artery: Secondary | ICD-10-CM

## 2018-09-09 NOTE — Chronic Care Management (AMB) (Deleted)
  Chronic Care Management   Note  09/09/2018 Name: Susan House MRN: [id number] DOB: 05-Feb-1956     {CCM FOLLOW UP [id number]}  SIGNATURE

## 2018-09-09 NOTE — Chronic Care Management (AMB) (Signed)
  Chronic Care Management   Note  09/09/2018 Name: Susan House MRN: [id number] DOB: 03-03-1956  Care Coordination: CCM RN CM met with Susan House during today's encounter with PCP for recent fall out of bed. Susan House has agreed to reschedule appointment with the CCM Team to discuss DM education and medication reconciliation. She was again informed to bring ALL of her mediations to this appointment along with her BP cuff and glucometer for review.  Face to Face appointment with CCM team member scheduled foR:  09/23/2018 at 11:00   [place]. Rollene Rotunda, RN, BSN Nurse Care Coordinator [facility] Care Management [phone]

## 2018-09-09 NOTE — Chronic Care Management (AMB) (Signed)
  Chronic Care Management   Note  09/09/2018 Name: Susan House MRN: [id number] DOB: [birth year]   Care Coordination: Incoming call received from [name], who has an initial face to face appointment with the CCM Team this morning at 10;00. [name] states she would like to cancel her and her husbands appointment. [name] states she fell out of bed last night and is "weak". She states her arm is bruised but denies other injuries. She does not wish for CCM RN CM to make urgent appointment with PCP. States "I am fine". She is unsure why she fell out of bed. She does not wish to reschedule face to face with the CCM Team at this time.  Addendum: Patient has made appointment with PCP this pm related to her fall. CCM Team will follow up with [name] during her appointment.  Next PCP appointment scheduled for:  today at 4:00  Tiyah Zelenak E. Rollene Rotunda, RN, BSN Nurse Care Coordinator [facility] [phone]

## 2018-09-09 NOTE — Progress Notes (Signed)
Patient: Susan House Female    DOB: 1956/02/26   63 y.o.   MRN: [id number] Visit Date: 09/09/2018  Today's Provider: Wilhemena Durie, MD   Chief Complaint  Patient presents with  . Fall   Subjective:     Fall  The accident occurred 2 days ago. The fall occurred from a bed. She fell from an unknown height. She landed on hard floor. The point of impact was the head and right shoulder. The pain is at a severity of 4/10. The pain is mild. The symptoms are aggravated by pressure on injury, rotation and movement. Associated symptoms include headaches. Pertinent negatives include no fever, loss of consciousness or numbness. She has tried nothing for the symptoms.  Other than some stiffness in her neck and some bruising in discomfort in the right upper arm she has no problems from the fall.  EMS came and she had no problems with weightbearing on her right leg.  No discernible injuries to the left side.  The patient has had no further hypoglycemic episodes since discharge from the hospital.  She is now taking [place] just once a day.  Allergies  Allergen Reactions  . Morphine And Related Anaphylaxis  . Fentanyl Other (See Comments)    Unknown reaction.  . Reglan [Metoclopramide]     Other reaction(s): Unknown Elevated BP  . Simvastatin Other (See Comments)    Muscle pain  . Betadine [Povidone Iodine] Rash  . Iodine Rash    Other reaction(s): Unknown-not allergic to ct contrast-ars 04/01/18  . Tetracyclines & Related Rash    Other reaction(s): Unknown     Current Outpatient Medications:  .  ACCU-CHEK SOFTCLIX LANCETS lancets, CHECK BLOOD SUGAR THREE TIMES DAILY, Disp: 300 each, Rfl: 3 .  amLODipine (NORVASC) 10 MG tablet, Take 1 tablet by mouth daily, Disp: 90 tablet, Rfl: 2 .  atorvastatin (LIPITOR) 20 MG tablet, Take 1 tablet (20 mg total) by mouth daily., Disp: 90 tablet, Rfl: 3 .  Blood Glucose Monitoring Suppl (ACCU-CHEK AVIVA PLUS) w/Device KIT, Check sugar  three times daily DX E11.9-needs a meter, Disp: 1 kit, Rfl: 0 .  cloNIDine (CATAPRES) 0.3 MG tablet, Take 1 tablet (0.3 mg total) by mouth 2 (two) times daily., Disp: 60 tablet, Rfl: 12 .  clopidogrel (PLAVIX) 75 MG tablet, Take 1 tablet (75 mg total) by mouth daily., Disp: 90 tablet, Rfl: 3 .  Continuous Blood Gluc Receiver (FREESTYLE LIBRE 14 DAY READER) DEVI, LENGTH OF NEED: LIFETIME - UNLESS SPECIFIED OTHERWISE, Disp: 2 Device, Rfl: 11 .  Continuous Blood Gluc Sensor (FREESTYLE LIBRE 14 DAY SENSOR) MISC, LENGTH OF NEED: LIFETIME - UNLESS SPECIFIED OTHERWISE, Disp: 2 each, Rfl: 0 .  enalapril (VASOTEC) 20 MG tablet, Take 1 tablet (20 mg total) by mouth 2 (two) times daily., Disp: 180 tablet, Rfl: 2 .  fluconazole (DIFLUCAN) 150 MG tablet, Take 1 tablet (150 mg total) by mouth daily., Disp: 2 tablet, Rfl: 0 .  glucose blood (ACCU-CHEK AVIVA PLUS) test strip, TEST BLOOD SUGAR THREE TIMES DAILY, Disp: 300 each, Rfl: 3 .  hydrALAZINE (APRESOLINE) 100 MG tablet, Take 1 tablet (100 mg total) by mouth 2 (two) times daily., Disp: 60 tablet, Rfl: 11 .  HYDROcodone-acetaminophen (NORCO) 10-325 MG tablet, Take 1-2 tablets by mouth every 6 (six) hours as needed., Disp: 100 tablet, Rfl: 0 .  insulin degludec (TRESIBA FLEXTOUCH) 100 UNIT/ML SOPN FlexTouch Pen, Up to 30 units daily. (Patient taking differently: Inject 20 Units  into the skin daily. ), Disp: 3 pen, Rfl: 11 .  Lancet Devices (ACCU-CHEK SOFTCLIX) lancets, Check sugar three times daily DX E11.9, Disp: 100 each, Rfl: 3 .  levETIRAcetam (KEPPRA) 500 MG tablet, Take 1 tablet (500 mg total) by mouth 2 (two) times daily. 500 mg bid., Disp: 180 tablet, Rfl: 3 .  metFORMIN (GLUCOPHAGE) 1000 MG tablet, Take 1 tablet (1,000 mg total) by mouth 2 (two) times daily., Disp: 180 tablet, Rfl: 3 .  metoprolol succinate (TOPROL-XL) 100 MG 24 hr tablet, Take 1 tablet (100 mg total) by mouth daily., Disp: 90 tablet, Rfl: 3 .  nitroGLYCERIN (NITROSTAT) 0.4 MG SL tablet,  Place 1 tablet (0.4 mg total) under the tongue every 5 (five) minutes as needed for chest pain., Disp: 25 tablet, Rfl: 0 .  oxybutynin (DITROPAN) 5 MG tablet, Take 1 tablet (5 mg total) by mouth 2 (two) times daily., Disp: 180 tablet, Rfl: 3 .  oxyCODONE (OXY IR/ROXICODONE) 5 MG immediate release tablet, Take 1 tablet (5 mg total) by mouth every 4 (four) hours as needed for severe pain., Disp: 30 tablet, Rfl: 0 .  pantoprazole (PROTONIX) 40 MG tablet, Take 1 tablet (40 mg total) by mouth daily., Disp: 90 tablet, Rfl: 2 .  cephALEXin (KEFLEX) 500 MG capsule, Take 1 capsule (500 mg total) by mouth 2 (two) times daily. (Patient not taking: Reported on 09/09/2018), Disp: 14 capsule, Rfl: 0  Review of Systems  Constitutional: Negative for activity change, appetite change, chills, diaphoresis, fatigue, fever and unexpected weight change.  HENT: Negative.   Eyes: Negative.   Respiratory: Negative for cough and shortness of breath.   Cardiovascular: Negative for chest pain, palpitations and leg swelling.  Gastrointestinal: Negative.   Endocrine: Negative.   Musculoskeletal: Positive for arthralgias and neck pain.  Skin: Negative.   Allergic/Immunologic: Negative.   Neurological: Positive for headaches. Negative for loss of consciousness and numbness.  Psychiatric/Behavioral: Negative.     Social History   Tobacco Use  . Smoking status: Never Smoker  . Smokeless tobacco: Never Used  Substance Use Topics  . Alcohol use: No      Objective:   BP (!) 142/78 (BP Location: Right Arm, Patient Position: Sitting, Cuff Size: Large)   Pulse 62   Temp 98.4 F (36.9 C)   Resp 20   Wt 239 lb (108.4 kg)   SpO2 100%   BMI 45.16 kg/m  Vitals:   09/09/18 1615  BP: (!) 142/78  Pulse: 62  Resp: 20  Temp: 98.4 F (36.9 C)  SpO2: 100%  Weight: 239 lb (108.4 kg)     Physical Exam Vitals signs reviewed.  Constitutional:      Appearance: She is obese.  HENT:     Head: Normocephalic and  atraumatic.     Right Ear: External ear normal.     Left Ear: External ear normal.     Nose: Nose normal.  Eyes:     General: No scleral icterus.    Conjunctiva/sclera: Conjunctivae normal.  Neck:     Comments: Very mild paracervical tenderness.. Cardiovascular:     Rate and Rhythm: Normal rate and regular rhythm.     Heart sounds: Normal heart sounds.  Pulmonary:     Effort: Pulmonary effort is normal.     Breath sounds: Normal breath sounds.  Abdominal:     Palpations: Abdomen is soft.  Musculoskeletal:        General: Tenderness present.     Comments: Mild tenderness of shoulder  region on the right with full range of motion without crepitance.  Lymphadenopathy:     Cervical: No cervical adenopathy.  Skin:    Findings: Bruising present.     Comments: Sniffing and bruising of the right posterior arm from  the shoulder to about two thirds of the way down to the elbow  Neurological:     Mental Status: She is alert and oriented to person, place, and time. Mental status is at baseline.  Psychiatric:        Mood and Affect: Mood normal.        Behavior: Behavior normal.        Thought Content: Thought content normal.        Judgment: Judgment normal.         Assessment & Plan    1. Type 2 diabetes mellitus with stage 3 chronic kidney disease, without long-term current use of insulin ([place]) She has had brittle diabetes by her description.  I am not sure how compliant she is with dietary measures.  She would benefit from endocrinology referral.  Last A1c in the office was 12.  Is now on [place] daily.  Hopefully that will lessen some of her hypoglycemic episodes. - Ambulatory referral to Endocrinology 2. Fall, initial encounter Offered x-rays of her neck and shoulder the patient declines.  I think there is a very low likelihood that she has any bony injuries.  I think it is okay to just treat this vertically with icing and heat and Tylenol.  We discussed fall risk at length. More  than 50% of 25-minute visit spent in counseling and coordination of care. 3. Acute pain of right shoulder Secondary to fall  4. Cervical paraspinal muscle spasm She fell from the bed was a rail beside the bed and she had her head and neck caught in that rail.  Her husband helped her and she was fine.  He has some cervical spasm from this.    I have done the exam and reviewed the above chart and it is accurate to the best of my knowledge. Development worker, community has been used in this note in any air is in the dictation or transcription are unintentional.  Wilhemena Durie, MD  [place]

## 2018-09-09 NOTE — Progress Notes (Signed)
This encounter was created in error - please disregard.  This encounter was created in error - please disregard.

## 2018-09-18 DIAGNOSIS — M5136 Other intervertebral disc degeneration, lumbar region: Secondary | ICD-10-CM | POA: Diagnosis not present

## 2018-09-18 DIAGNOSIS — M5416 Radiculopathy, lumbar region: Secondary | ICD-10-CM | POA: Diagnosis not present

## 2018-09-18 DIAGNOSIS — M48062 Spinal stenosis, lumbar region with neurogenic claudication: Secondary | ICD-10-CM | POA: Diagnosis not present

## 2018-09-20 ENCOUNTER — Ambulatory Visit: Payer: Self-pay | Admitting: Physician Assistant

## 2018-09-20 ENCOUNTER — Other Ambulatory Visit: Payer: Self-pay

## 2018-09-20 ENCOUNTER — Emergency Department: Payer: Medicare Other

## 2018-09-20 ENCOUNTER — Emergency Department
Admission: EM | Admit: 2018-09-20 | Discharge: 2018-09-20 | Disposition: A | Discharge status: Home / Self Care | Payer: Medicare Other | Attending: Emergency Medicine | Admitting: Emergency Medicine

## 2018-09-20 DIAGNOSIS — S0993XA Unspecified injury of face, initial encounter: Secondary | ICD-10-CM | POA: Diagnosis not present

## 2018-09-20 DIAGNOSIS — I129 Hypertensive chronic kidney disease with stage 1 through stage 4 chronic kidney disease, or unspecified chronic kidney disease: Secondary | ICD-10-CM | POA: Diagnosis not present

## 2018-09-20 DIAGNOSIS — W010XXA Fall on same level from slipping, tripping and stumbling without subsequent striking against object, initial encounter: Secondary | ICD-10-CM | POA: Diagnosis not present

## 2018-09-20 DIAGNOSIS — Y998 Other external cause status: Secondary | ICD-10-CM | POA: Insufficient documentation

## 2018-09-20 DIAGNOSIS — S80919A Unspecified superficial injury of unspecified knee, initial encounter: Secondary | ICD-10-CM | POA: Diagnosis not present

## 2018-09-20 DIAGNOSIS — Z8673 Personal history of transient ischemic attack (TIA), and cerebral infarction without residual deficits: Secondary | ICD-10-CM | POA: Diagnosis not present

## 2018-09-20 DIAGNOSIS — S8002XA Contusion of left knee, initial encounter: Secondary | ICD-10-CM

## 2018-09-20 DIAGNOSIS — S8992XA Unspecified injury of left lower leg, initial encounter: Secondary | ICD-10-CM | POA: Diagnosis not present

## 2018-09-20 DIAGNOSIS — Y929 Unspecified place or not applicable: Secondary | ICD-10-CM | POA: Diagnosis not present

## 2018-09-20 DIAGNOSIS — W19XXXA Unspecified fall, initial encounter: Secondary | ICD-10-CM

## 2018-09-20 DIAGNOSIS — Y9389 Activity, other specified: Secondary | ICD-10-CM | POA: Diagnosis not present

## 2018-09-20 DIAGNOSIS — Z79899 Other long term (current) drug therapy: Secondary | ICD-10-CM | POA: Diagnosis not present

## 2018-09-20 DIAGNOSIS — S0083XA Contusion of other part of head, initial encounter: Secondary | ICD-10-CM

## 2018-09-20 DIAGNOSIS — M25562 Pain in left knee: Secondary | ICD-10-CM | POA: Diagnosis not present

## 2018-09-20 DIAGNOSIS — R52 Pain, unspecified: Secondary | ICD-10-CM | POA: Diagnosis not present

## 2018-09-20 DIAGNOSIS — Z794 Long term (current) use of insulin: Secondary | ICD-10-CM | POA: Insufficient documentation

## 2018-09-20 DIAGNOSIS — N183 Chronic kidney disease, stage 3 (moderate): Secondary | ICD-10-CM | POA: Insufficient documentation

## 2018-09-20 DIAGNOSIS — R220 Localized swelling, mass and lump, head: Secondary | ICD-10-CM | POA: Diagnosis not present

## 2018-09-20 DIAGNOSIS — E1122 Type 2 diabetes mellitus with diabetic chronic kidney disease: Secondary | ICD-10-CM | POA: Insufficient documentation

## 2018-09-20 DIAGNOSIS — R51 Headache: Secondary | ICD-10-CM | POA: Diagnosis not present

## 2018-09-20 DIAGNOSIS — N390 Urinary tract infection, site not specified: Secondary | ICD-10-CM | POA: Diagnosis not present

## 2018-09-20 DIAGNOSIS — S0990XA Unspecified injury of head, initial encounter: Secondary | ICD-10-CM | POA: Diagnosis not present

## 2018-09-20 LAB — URINALYSIS, COMPLETE (UACMP) WITH MICROSCOPIC
Bilirubin Urine: NEGATIVE
Glucose, UA: 150 mg/dL — AB
Ketones, ur: NEGATIVE mg/dL
Nitrite: POSITIVE — AB
Protein, ur: 100 mg/dL — AB
SPECIFIC GRAVITY, URINE: 1.012 (ref 1.005–1.030)
WBC, UA: >50 WBC/hpf — ABNORMAL HIGH (ref 0–5)
pH: 6 (ref 5.0–8.0)

## 2018-09-20 MED ORDER — ACETAMINOPHEN 500 MG PO TABS
ORAL_TABLET | ORAL | Status: AC
  Filled 2018-09-20: qty 1

## 2018-09-20 MED ORDER — CEPHALEXIN 500 MG PO CAPS: 500.0000 mg | ORAL_CAPSULE | Freq: Two times a day (BID) | ORAL | 0 refills | Status: DC

## 2018-09-20 MED ORDER — ACETAMINOPHEN 500 MG PO TABS
1000.0000 mg | ORAL_TABLET | Freq: Once | ORAL | Status: AC
  Administered 2018-09-20: 1000 mg via ORAL
  Filled 2018-09-20: qty 2

## 2018-09-20 NOTE — ED Notes (Signed)
Patient reports recent Diarrhea with subsequent burning and urination. Has an appt scheduled with PCP today for these complaints. Dr. Corky Downs alerted see orders.

## 2018-09-20 NOTE — ED Notes (Signed)
Patient transported to CT 

## 2018-09-20 NOTE — ED Triage Notes (Signed)
Jisselle 62 from home  status post fall.  EMS reports patient tripped and fell hitting left side of face on hardwood floor patient has left facial swelling and bruising.  Patient has a history of cva with residual left sided weakness. Vital signs WNL . cbg 240.

## 2018-09-20 NOTE — ED Notes (Signed)
Patient placed on bedpan.Skin care given.
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
## 2018-09-21 ENCOUNTER — Encounter: Payer: Self-pay | Admitting: Emergency Medicine

## 2018-09-21 ENCOUNTER — Emergency Department
Admission: EM | Admit: 2018-09-21 | Discharge: 2018-09-21 | Disposition: A | Discharge status: Home / Self Care | Payer: Medicare Other | Attending: Emergency Medicine | Admitting: Emergency Medicine

## 2018-09-21 ENCOUNTER — Other Ambulatory Visit: Payer: Self-pay

## 2018-09-21 ENCOUNTER — Emergency Department: Payer: Medicare Other

## 2018-09-21 DIAGNOSIS — Z8673 Personal history of transient ischemic attack (TIA), and cerebral infarction without residual deficits: Secondary | ICD-10-CM | POA: Insufficient documentation

## 2018-09-21 DIAGNOSIS — I10 Essential (primary) hypertension: Secondary | ICD-10-CM | POA: Diagnosis not present

## 2018-09-21 DIAGNOSIS — Z794 Long term (current) use of insulin: Secondary | ICD-10-CM | POA: Diagnosis not present

## 2018-09-21 DIAGNOSIS — R51 Headache: Secondary | ICD-10-CM | POA: Diagnosis not present

## 2018-09-21 DIAGNOSIS — S0990XA Unspecified injury of head, initial encounter: Secondary | ICD-10-CM | POA: Diagnosis not present

## 2018-09-21 DIAGNOSIS — W19XXXD Unspecified fall, subsequent encounter: Secondary | ICD-10-CM | POA: Diagnosis not present

## 2018-09-21 DIAGNOSIS — Z79899 Other long term (current) drug therapy: Secondary | ICD-10-CM | POA: Diagnosis not present

## 2018-09-21 DIAGNOSIS — E119 Type 2 diabetes mellitus without complications: Secondary | ICD-10-CM | POA: Diagnosis not present

## 2018-09-21 DIAGNOSIS — S0990XD Unspecified injury of head, subsequent encounter: Secondary | ICD-10-CM | POA: Diagnosis not present

## 2018-09-21 DIAGNOSIS — R519 Headache, unspecified: Secondary | ICD-10-CM

## 2018-09-21 DIAGNOSIS — Z7902 Long term (current) use of antithrombotics/antiplatelets: Secondary | ICD-10-CM | POA: Diagnosis not present

## 2018-09-21 DIAGNOSIS — W19XXXA Unspecified fall, initial encounter: Secondary | ICD-10-CM | POA: Diagnosis not present

## 2018-09-21 DIAGNOSIS — E039 Hypothyroidism, unspecified: Secondary | ICD-10-CM | POA: Insufficient documentation

## 2018-09-21 DIAGNOSIS — G43909 Migraine, unspecified, not intractable, without status migrainosus: Secondary | ICD-10-CM | POA: Diagnosis not present

## 2018-09-21 IMAGING — CR DG CHEST 2V
1 series · 2 of 2 positions shown · non-contrast
Comparison: 10/15/2016 chest radiograph.

CLINICAL DATA: Chest pain

EXAM:
CHEST  2 VIEW

[Series 1: dg chest 2 view · 0.14mm/px · 2 of 2 slices shown]
[im 1/2]
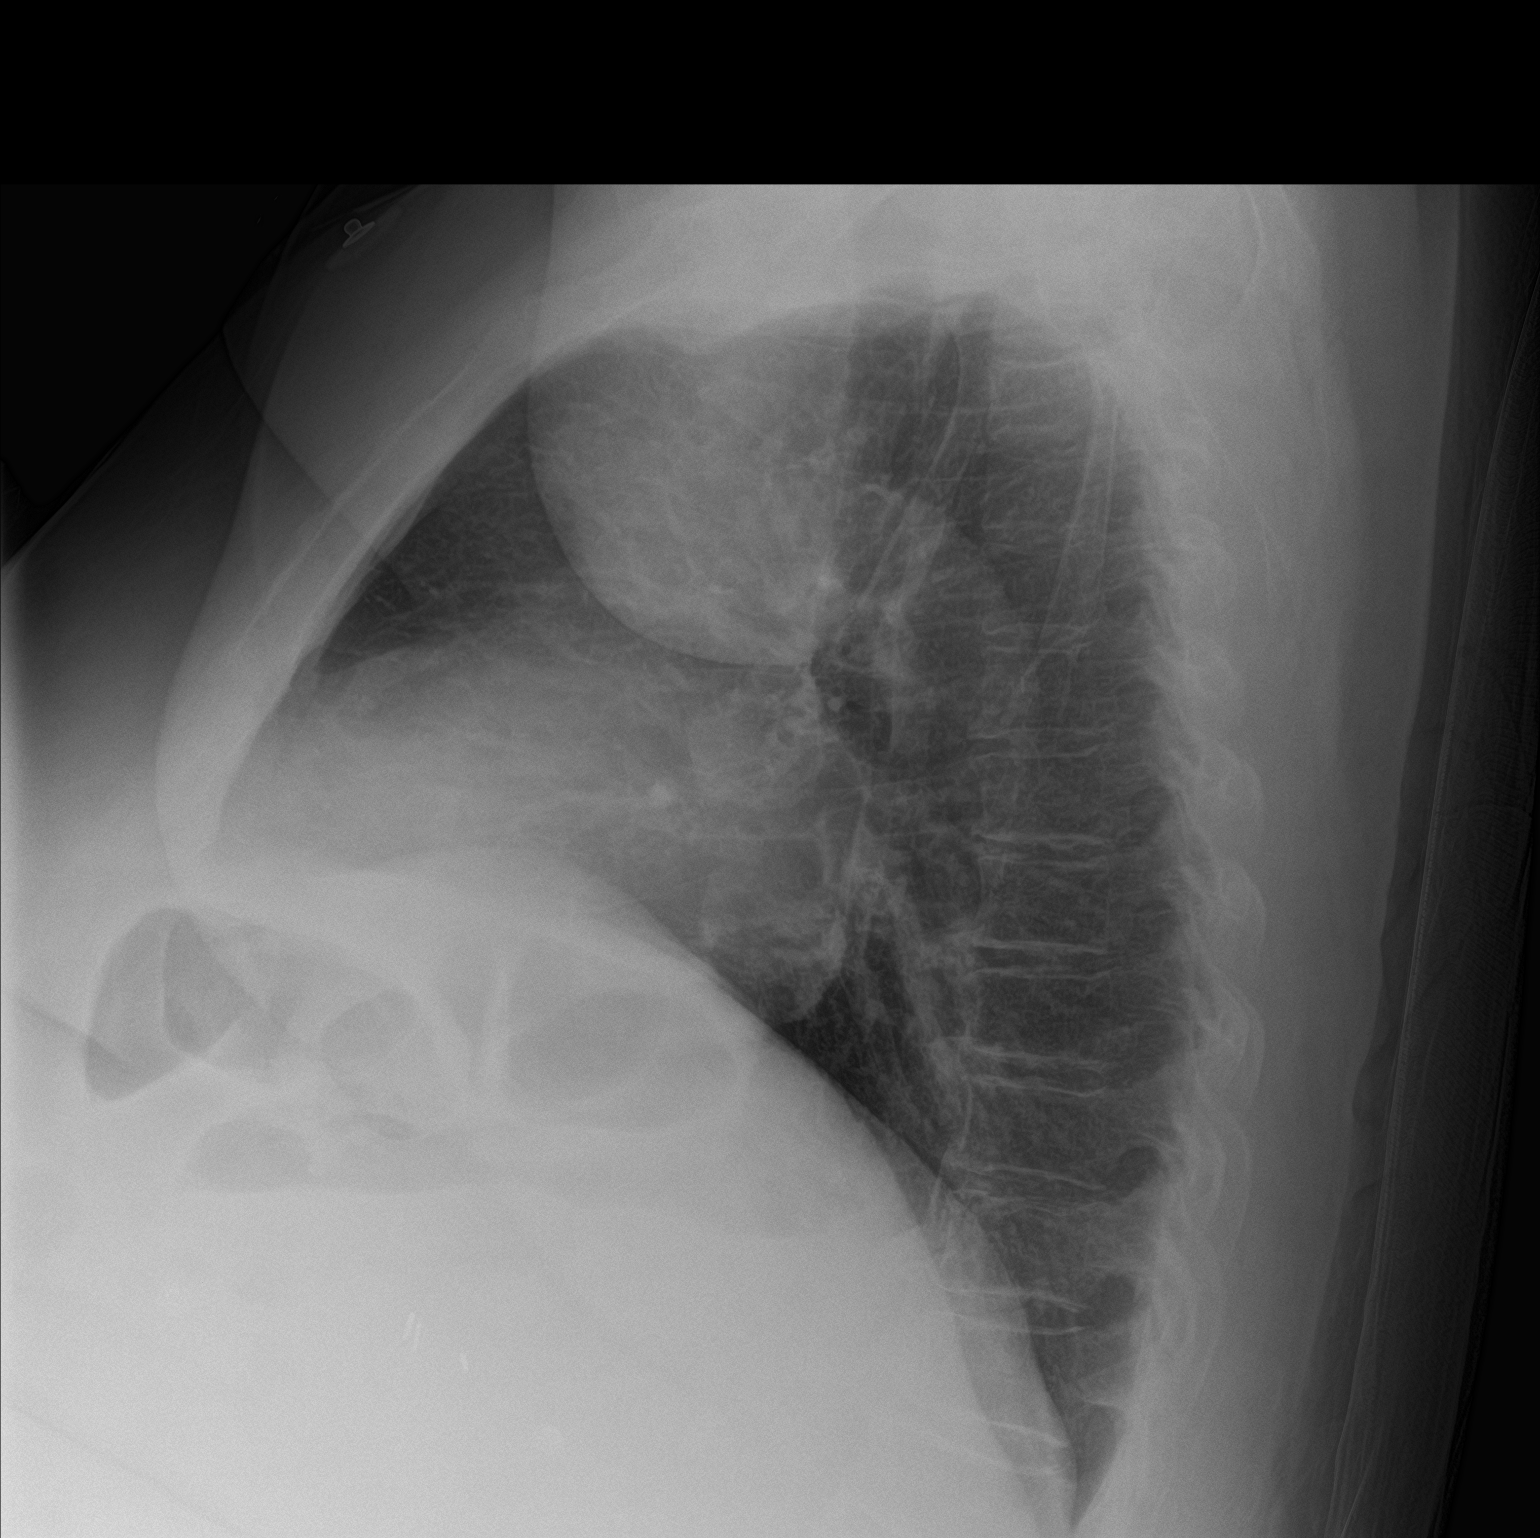
[im 2/2]
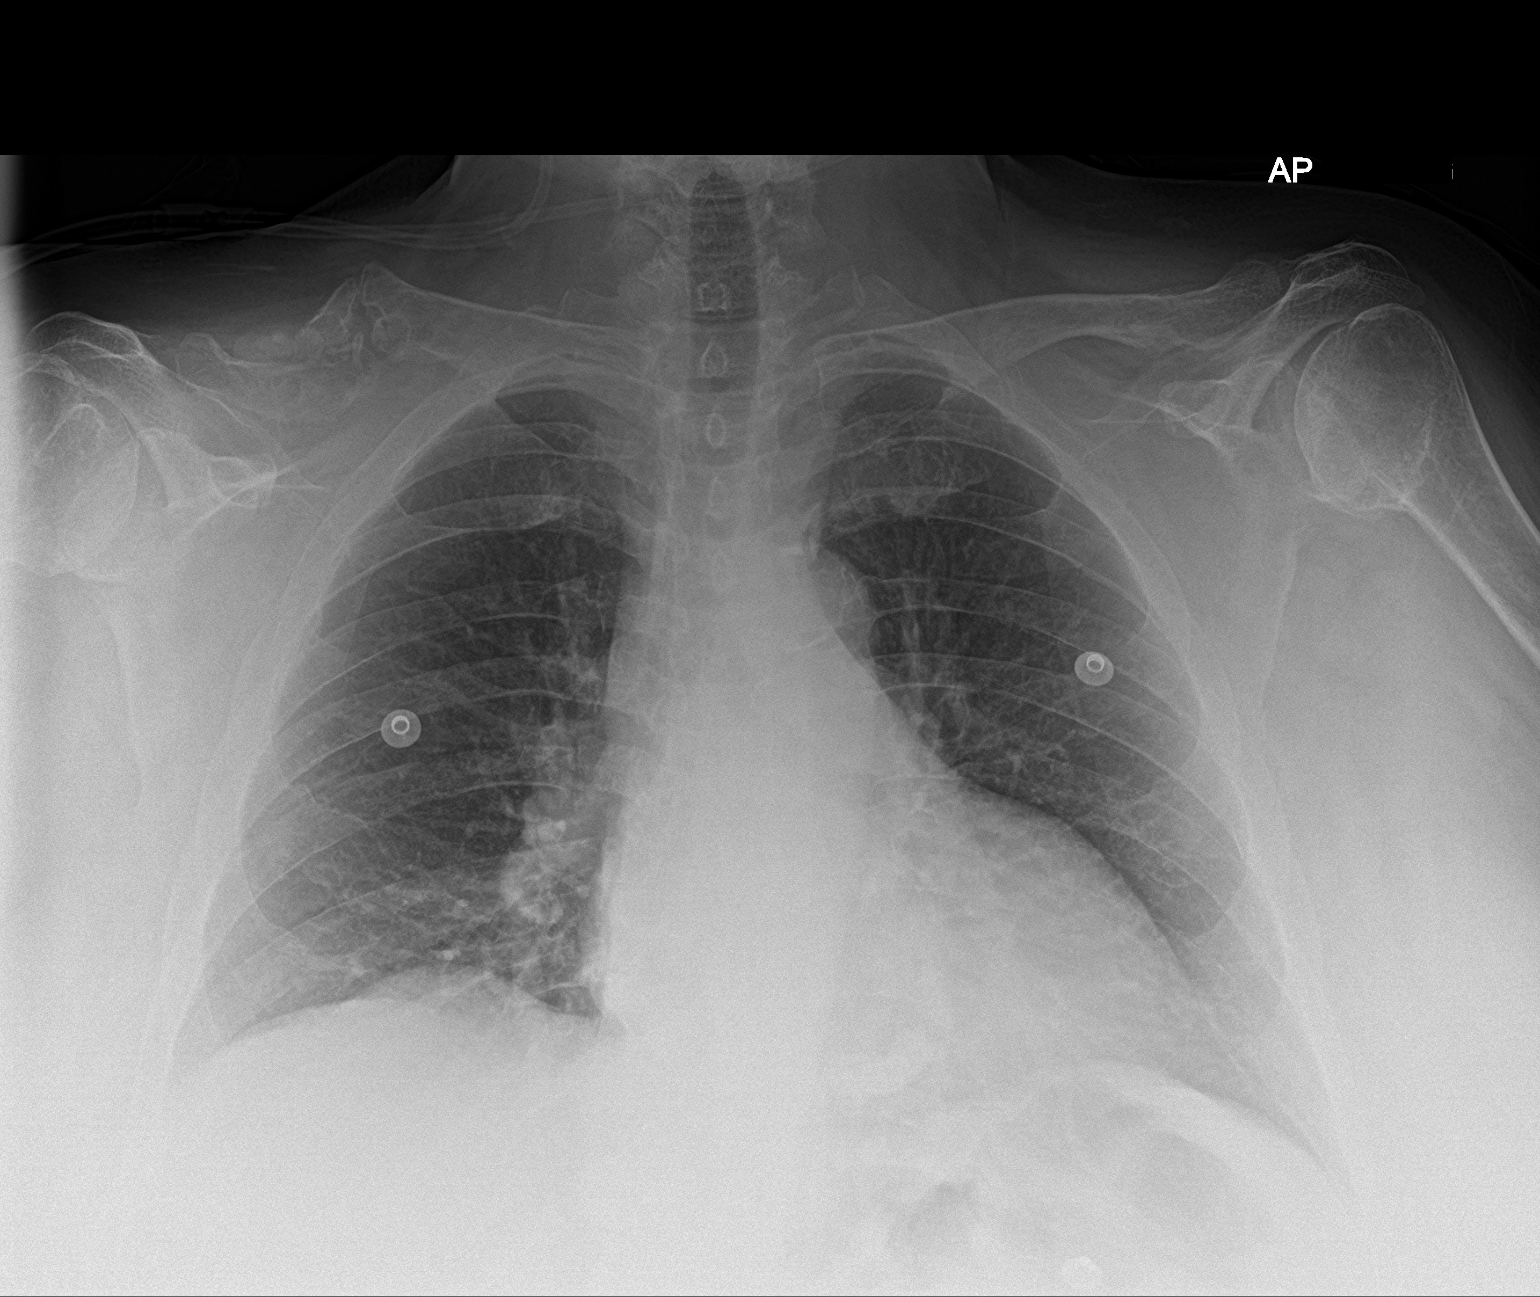

[2 of 2 positions shown; findings below may reference images not displayed]

FINDINGS: Stable cardiomediastinal silhouette with top-normal heart size and
aortic atherosclerosis. No pneumothorax. No pleural effusion. Lungs
appear clear, with no acute consolidative airspace disease and no
pulmonary edema.
IMPRESSION: No active cardiopulmonary disease.  Aortic atherosclerosis.

## 2018-09-21 MED ORDER — BUTALBITAL-APAP-CAFFEINE 50-325-40 MG PO TABS
1.0000 | ORAL_TABLET | Freq: Once | ORAL | Status: AC
  Administered 2018-09-21: 1 via ORAL
  Filled 2018-09-21: qty 1

## 2018-09-21 MED ORDER — BUTALBITAL-APAP-CAFFEINE 50-325-40 MG PO TABS: 1.0000 | ORAL_TABLET | Freq: Four times a day (QID) | ORAL | 0 refills | Status: DC | PRN

## 2018-09-21 MED ORDER — KETOROLAC TROMETHAMINE 30 MG/ML IJ SOLN
30.0000 mg | Freq: Once | INTRAMUSCULAR | Status: AC
  Administered 2018-09-21: 30 mg via INTRAVENOUS
  Filled 2018-09-21: qty 1

## 2018-09-21 MED ORDER — ONDANSETRON HCL 4 MG/2ML IJ SOLN
4.0000 mg | Freq: Once | INTRAMUSCULAR | Status: AC
  Administered 2018-09-21: 4 mg via INTRAVENOUS
  Filled 2018-09-21: qty 2

## 2018-09-21 NOTE — ED Notes (Signed)
Call light answered, pt nauseated, EDP notified, orders rx'd

## 2018-09-21 NOTE — ED Notes (Signed)
Peripheral IV discontinued. Catheter intact. No signs of infiltration or redness. Gauze applied to IV site.   Discharge instructions reviewed with patient. Questions fielded by this RN. Patient verbalizes understanding of instructions. Patient discharged home in stable condition per brown. No acute distress noted at time of discharge.
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
## 2018-09-21 NOTE — ED Notes (Signed)
Patient transported to CT 

## 2018-09-21 NOTE — ED Triage Notes (Signed)
Patient presents to Emergency Department via [facility] from home with complaints of HA and vision loss since 2100  Pt was seen earlier today for fall and released  Pt on Plavix, prior CVA in 2000 with left side weakness  Pt took 1000 mg tylenol at 2300, and 1 x tylenol #3 at 0100 without any relief of pain

## 2018-09-23 ENCOUNTER — Ambulatory Visit (INDEPENDENT_AMBULATORY_CARE_PROVIDER_SITE_OTHER): Payer: Medicare Other | Admitting: Family Medicine

## 2018-09-23 ENCOUNTER — Ambulatory Visit: Payer: Self-pay

## 2018-09-23 ENCOUNTER — Encounter: Payer: Self-pay | Admitting: Family Medicine

## 2018-09-23 VITALS — BP 124/74 | HR 59 | Temp 97.6°F | Resp 16

## 2018-09-23 DIAGNOSIS — I69354 Hemiplegia and hemiparesis following cerebral infarction affecting left non-dominant side: Secondary | ICD-10-CM | POA: Diagnosis not present

## 2018-09-23 DIAGNOSIS — G44319 Acute post-traumatic headache, not intractable: Secondary | ICD-10-CM | POA: Diagnosis not present

## 2018-09-23 DIAGNOSIS — I69993 Ataxia following unspecified cerebrovascular disease: Secondary | ICD-10-CM | POA: Diagnosis not present

## 2018-09-23 DIAGNOSIS — T07XXXA Unspecified multiple injuries, initial encounter: Secondary | ICD-10-CM | POA: Diagnosis not present

## 2018-09-23 NOTE — Progress Notes (Signed)
Patient: Susan House Female    DOB: 07/28/1955   63 y.o.   MRN: [id number] Visit Date: 09/23/2018  Today's Provider: Vernie Murders, PA   Chief Complaint  Patient presents with  . Hospitalization Follow-up   Subjective:     HPI   Follow up ER visit  Patient was seen in ER for Fall on 09/21/2018. She was treated for Head and knee injury. Treatment for this included; CT scan of head, x-ray of knee. She reports good compliance with treatment. She reports this condition is Unchanged.  -------------------------------------------------------------------  Patient states she is still having a headache. Also patient states she is having a pain in her chest.   Past Medical History:  Diagnosis Date  . Allergy   . Arthritis   . Cellulitis and abscess of face   . Depression   . Diabetes mellitus ([place])   . Edema   . GERD (gastroesophageal reflux disease)   . Hematuria   . History of echocardiogram    2008: normal, 2011: normal LVSF  . History of nuclear stress test    a. 12/2009: lexiscan - negative  . Hyperlipidemia   . Hypertension   . IBS (irritable bowel syndrome)   . Migraine   . Morbid obesity ([place])   . Nocturia   . Stroke ([place]) 2000  . TIA (transient ischemic attack) 2010  . Urgency of micturation   . Urinary frequency   . Urinary incontinence    Past Surgical History:  Procedure Laterality Date  . ABDOMINAL HYSTERECTOMY    . ABDOMINOPLASTY    . APPENDECTOMY    . BACK SURGERY     extensive spinal fusion  . carpel tunnel surgery    . CATARACT EXTRACTION     x 2  . CESAREAN SECTION    . CHOLECYSTECTOMY    . COLONOSCOPY  2010  . COLONOSCOPY WITH PROPOFOL N/A 07/11/2016   Procedure: COLONOSCOPY WITH PROPOFOL;  Surgeon: Lucilla Lame, MD;  Location: [facility] ENDOSCOPY;  Service: Endoscopy;  Laterality: N/A;  . COLONOSCOPY WITH PROPOFOL N/A 03/26/2018   Procedure: COLONOSCOPY WITH PROPOFOL;  Surgeon: Lucilla Lame, MD;  Location: [facility] ENDOSCOPY;   Service: Endoscopy;  Laterality: N/A;  . ESOPHAGOGASTRODUODENOSCOPY (EGD) WITH PROPOFOL N/A 03/26/2018   Procedure: ESOPHAGOGASTRODUODENOSCOPY (EGD) WITH PROPOFOL;  Surgeon: Lucilla Lame, MD;  Location: [facility] ENDOSCOPY;  Service: Endoscopy;  Laterality: N/A;  . EYE SURGERY    . HERNIA REPAIR    . SHOULDER SURGERY    . TOE SURGERY    . UPPER GI ENDOSCOPY    . WRIST FRACTURE SURGERY Left    Family History  Problem Relation Age of Onset  . Hyperlipidemia Mother   . Arrhythmia Mother        WPW  . Hypertension Mother   . Rheum arthritis Mother   . Heart disease Mother   . Heart attack Father 47  . Hyperlipidemia Father   . Hypertension Father   . Stroke Father   . Diabetes Father   . Heart disease Father   . Coronary artery disease Father   . Diabetes Sister   . Hyperlipidemia Sister   . Hypertension Sister   . Depression Sister   . Depression Sister   . Diabetes Sister   . Hypertension Sister   . Hyperlipidemia Sister   . Kidney disease Paternal Grandmother    Allergies  Allergen Reactions  . Morphine And Related Anaphylaxis  . Fentanyl Other (See Comments)  Unknown reaction.  . Reglan [Metoclopramide]     Other reaction(s): Unknown Elevated BP  . Simvastatin Other (See Comments)    Muscle pain  . Betadine [Povidone Iodine] Rash  . Iodine Rash    Other reaction(s): Unknown-not allergic to ct contrast-ars 04/01/18  . Tetracyclines & Related Rash    Other reaction(s): Unknown    Current Outpatient Medications:  .  ACCU-CHEK SOFTCLIX LANCETS lancets, CHECK BLOOD SUGAR THREE TIMES DAILY, Disp: 300 each, Rfl: 3 .  amLODipine (NORVASC) 10 MG tablet, Take 1 tablet by mouth daily, Disp: 90 tablet, Rfl: 2 .  atorvastatin (LIPITOR) 20 MG tablet, Take 1 tablet (20 mg total) by mouth daily., Disp: 90 tablet, Rfl: 3 .  Blood Glucose Monitoring Suppl (ACCU-CHEK AVIVA PLUS) w/Device KIT, Check sugar three times daily DX E11.9-needs a meter, Disp: 1 kit, Rfl: 0 .   butalbital-acetaminophen-caffeine (FIORICET, ESGIC) 50-325-40 MG tablet, Take 1 tablet by mouth every 6 (six) hours as needed for headache., Disp: 20 tablet, Rfl: 0 .  cephALEXin (KEFLEX) 500 MG capsule, Take 1 capsule (500 mg total) by mouth 2 (two) times daily., Disp: 14 capsule, Rfl: 0 .  cloNIDine (CATAPRES) 0.3 MG tablet, Take 1 tablet (0.3 mg total) by mouth 2 (two) times daily., Disp: 60 tablet, Rfl: 12 .  clopidogrel (PLAVIX) 75 MG tablet, Take 1 tablet (75 mg total) by mouth daily., Disp: 90 tablet, Rfl: 3 .  Continuous Blood Gluc Receiver (FREESTYLE LIBRE 14 DAY READER) DEVI, LENGTH OF NEED: LIFETIME - UNLESS SPECIFIED OTHERWISE, Disp: 2 Device, Rfl: 11 .  Continuous Blood Gluc Sensor (FREESTYLE LIBRE 14 DAY SENSOR) MISC, LENGTH OF NEED: LIFETIME - UNLESS SPECIFIED OTHERWISE, Disp: 2 each, Rfl: 0 .  enalapril (VASOTEC) 20 MG tablet, Take 1 tablet (20 mg total) by mouth 2 (two) times daily., Disp: 180 tablet, Rfl: 2 .  fluconazole (DIFLUCAN) 150 MG tablet, Take 1 tablet (150 mg total) by mouth daily., Disp: 2 tablet, Rfl: 0 .  glucose blood (ACCU-CHEK AVIVA PLUS) test strip, TEST BLOOD SUGAR THREE TIMES DAILY, Disp: 300 each, Rfl: 3 .  hydrALAZINE (APRESOLINE) 100 MG tablet, Take 1 tablet (100 mg total) by mouth 2 (two) times daily., Disp: 60 tablet, Rfl: 11 .  HYDROcodone-acetaminophen (NORCO) 10-325 MG tablet, Take 1-2 tablets by mouth every 6 (six) hours as needed., Disp: 100 tablet, Rfl: 0 .  insulin degludec (TRESIBA FLEXTOUCH) 100 UNIT/ML SOPN FlexTouch Pen, Up to 30 units daily. (Patient taking differently: Inject 20 Units into the skin daily. ), Disp: 3 pen, Rfl: 11 .  Lancet Devices (ACCU-CHEK SOFTCLIX) lancets, Check sugar three times daily DX E11.9, Disp: 100 each, Rfl: 3 .  levETIRAcetam (KEPPRA) 500 MG tablet, Take 1 tablet (500 mg total) by mouth 2 (two) times daily. 500 mg bid., Disp: 180 tablet, Rfl: 3 .  metFORMIN (GLUCOPHAGE) 1000 MG tablet, Take 1 tablet (1,000 mg total) by  mouth 2 (two) times daily., Disp: 180 tablet, Rfl: 3 .  metoprolol succinate (TOPROL-XL) 100 MG 24 hr tablet, Take 1 tablet (100 mg total) by mouth daily., Disp: 90 tablet, Rfl: 3 .  nitroGLYCERIN (NITROSTAT) 0.4 MG SL tablet, Place 1 tablet (0.4 mg total) under the tongue every 5 (five) minutes as needed for chest pain., Disp: 25 tablet, Rfl: 0 .  oxybutynin (DITROPAN) 5 MG tablet, Take 1 tablet (5 mg total) by mouth 2 (two) times daily., Disp: 180 tablet, Rfl: 3 .  oxyCODONE (OXY IR/ROXICODONE) 5 MG immediate release tablet, Take 1 tablet (  5 mg total) by mouth every 4 (four) hours as needed for severe pain., Disp: 30 tablet, Rfl: 0 .  pantoprazole (PROTONIX) 40 MG tablet, Take 1 tablet (40 mg total) by mouth daily., Disp: 90 tablet, Rfl: 2  Review of Systems  Constitutional: Negative for appetite change, chills, fatigue and fever.  Respiratory: Negative for chest tightness and shortness of breath.   Cardiovascular: Negative for chest pain and palpitations.  Gastrointestinal: Negative for abdominal pain, nausea and vomiting.  Neurological: Negative for dizziness and weakness.   Social History   Tobacco Use  . Smoking status: Never Smoker  . Smokeless tobacco: Never Used  Substance Use Topics  . Alcohol use: No     Objective:   BP 124/74 (BP Location: Right Arm, Patient Position: Sitting, Cuff Size: Large)   Pulse (!) 59   Temp 97.6 F (36.4 C) (Oral)   Resp 16   SpO2 96%  Vitals:   09/23/18 1056  BP: 124/74  Pulse: (!) 59  Resp: 16  Temp: 97.6 F (36.4 C)  TempSrc: Oral  SpO2: 96%   Physical Exam Constitutional:      General: She is not in acute distress.    Appearance: She is well-developed.  HENT:     Head: Normocephalic.     Comments: Large ecchymotic area under the left eye and cheek. Tender soft tissue to palpate with hematoma under the left maxillary bone.    Right Ear: Hearing and tympanic membrane normal.     Left Ear: Hearing and tympanic membrane normal.      Nose: Nose normal.     Mouth/Throat:     Pharynx: Oropharynx is clear.  Eyes:     General: Lids are normal. No scleral icterus.       Right eye: No discharge.        Left eye: No discharge.     Extraocular Movements: Extraocular movements intact.     Conjunctiva/sclera: Conjunctivae normal.     Pupils: Pupils are equal, round, and reactive to light.  Neck:     Musculoskeletal: Neck supple.  Cardiovascular:     Rate and Rhythm: Normal rate and regular rhythm.     Heart sounds: Normal heart sounds.  Pulmonary:     Effort: Pulmonary effort is normal. No respiratory distress.     Breath sounds: Normal breath sounds.     Comments: Chest wall tenderness anteriorly to compress/palpate. No significant bruises noted. Chest:     Chest wall: Tenderness present.  Abdominal:     General: Bowel sounds are normal.     Palpations: Abdomen is soft.  Musculoskeletal:     Comments: Weakness and contracture of left hand and arm from past CVA  Skin:    Findings: No lesion or rash.  Neurological:     Mental Status: She is alert and oriented to person, place, and time.     Motor: Weakness present.     Gait: Gait abnormal.     Comments: Weakness left side of body with poor balance.  Psychiatric:        Speech: Speech normal.        Behavior: Behavior normal.        Thought Content: Thought content normal.       Assessment & Plan    1. Multiple contusions Fell at home trying to carry a cushion with using her cane on 09-20-18. No loss of consciousness. Landed on her left side hitting her cheek on the floor. Large bruises  on the left cheek and minor scrapes and bruising of the knees. Evaluated at the ER that day and all diagnostic imaging was negative for fractures. Having more tenderness across chest to palpation with normal lung and heart exam. Probably fell due to poor balance, ataxia and left hemiplegia secondary to past stroke. Continue to monitor for neurologic changes.  2. Acute  post-traumatic headache, not intractable Onset the day after her fall on 09-20-18. Initial CT scans were negative. When headache persisted the next day, she went back to the ER for reassessment and repeat CT scan of head. A left maxillary soft tissue hematoma without fracture was noted. No intracranial bleed or lesion. Was treated with Toradol injection and given Fioricet. No further help from the Fioricet. Headache noted when she shakes her head. Had Oxycodone at home, but it has not helped any of her back, chest or head pains. Went back to the Vicodin with better relief. Advised to stop all the Oxycodone and Fioricet if using the Vicodin. Monitor for neurologic changes and recheck prn.  3. Spastic hemiplegia of left nondominant side as late effect of cerebral infarction (HCC) Unchanged ataxia and hemiplegia since CVA in July 2000. 4. Ataxia, late effect of cerebrovascular disease      Vernie Murders, PA  [facility]

## 2018-09-24 ENCOUNTER — Ambulatory Visit: Payer: Medicare Other | Admitting: Family Medicine

## 2018-09-25 ENCOUNTER — Observation Stay: Payer: Medicare Other

## 2018-09-25 ENCOUNTER — Emergency Department: Payer: Medicare Other

## 2018-09-25 ENCOUNTER — Observation Stay
Admission: EM | Admit: 2018-09-25 | Discharge: 2018-09-26 | Disposition: A | Discharge status: Home / Self Care | Payer: Medicare Other | Attending: Internal Medicine | Admitting: Internal Medicine

## 2018-09-25 ENCOUNTER — Encounter: Payer: Self-pay | Admitting: Emergency Medicine

## 2018-09-25 ENCOUNTER — Other Ambulatory Visit: Payer: Self-pay

## 2018-09-25 DIAGNOSIS — Z79899 Other long term (current) drug therapy: Secondary | ICD-10-CM | POA: Diagnosis not present

## 2018-09-25 DIAGNOSIS — W19XXXD Unspecified fall, subsequent encounter: Secondary | ICD-10-CM

## 2018-09-25 DIAGNOSIS — K219 Gastro-esophageal reflux disease without esophagitis: Secondary | ICD-10-CM | POA: Diagnosis not present

## 2018-09-25 DIAGNOSIS — E1122 Type 2 diabetes mellitus with diabetic chronic kidney disease: Secondary | ICD-10-CM | POA: Insufficient documentation

## 2018-09-25 DIAGNOSIS — G4700 Insomnia, unspecified: Secondary | ICD-10-CM | POA: Diagnosis not present

## 2018-09-25 DIAGNOSIS — I10 Essential (primary) hypertension: Secondary | ICD-10-CM

## 2018-09-25 DIAGNOSIS — G2581 Restless legs syndrome: Secondary | ICD-10-CM | POA: Insufficient documentation

## 2018-09-25 DIAGNOSIS — R079 Chest pain, unspecified: Secondary | ICD-10-CM

## 2018-09-25 DIAGNOSIS — I700 Atherosclerosis of aorta: Secondary | ICD-10-CM | POA: Diagnosis not present

## 2018-09-25 DIAGNOSIS — Z7902 Long term (current) use of antithrombotics/antiplatelets: Secondary | ICD-10-CM | POA: Insufficient documentation

## 2018-09-25 DIAGNOSIS — E1159 Type 2 diabetes mellitus with other circulatory complications: Secondary | ICD-10-CM | POA: Diagnosis not present

## 2018-09-25 DIAGNOSIS — I129 Hypertensive chronic kidney disease with stage 1 through stage 4 chronic kidney disease, or unspecified chronic kidney disease: Secondary | ICD-10-CM | POA: Insufficient documentation

## 2018-09-25 DIAGNOSIS — N183 Chronic kidney disease, stage 3 (moderate): Secondary | ICD-10-CM | POA: Diagnosis not present

## 2018-09-25 DIAGNOSIS — R519 Headache, unspecified: Secondary | ICD-10-CM

## 2018-09-25 DIAGNOSIS — E118 Type 2 diabetes mellitus with unspecified complications: Secondary | ICD-10-CM | POA: Diagnosis not present

## 2018-09-25 DIAGNOSIS — E785 Hyperlipidemia, unspecified: Secondary | ICD-10-CM | POA: Insufficient documentation

## 2018-09-25 DIAGNOSIS — E876 Hypokalemia: Secondary | ICD-10-CM | POA: Insufficient documentation

## 2018-09-25 DIAGNOSIS — K589 Irritable bowel syndrome without diarrhea: Secondary | ICD-10-CM | POA: Diagnosis not present

## 2018-09-25 DIAGNOSIS — Z8673 Personal history of transient ischemic attack (TIA), and cerebral infarction without residual deficits: Secondary | ICD-10-CM | POA: Diagnosis not present

## 2018-09-25 DIAGNOSIS — M1990 Unspecified osteoarthritis, unspecified site: Secondary | ICD-10-CM | POA: Insufficient documentation

## 2018-09-25 DIAGNOSIS — R51 Headache: Principal | ICD-10-CM

## 2018-09-25 DIAGNOSIS — Z794 Long term (current) use of insulin: Secondary | ICD-10-CM | POA: Insufficient documentation

## 2018-09-25 DIAGNOSIS — E039 Hypothyroidism, unspecified: Secondary | ICD-10-CM | POA: Insufficient documentation

## 2018-09-25 DIAGNOSIS — E1165 Type 2 diabetes mellitus with hyperglycemia: Secondary | ICD-10-CM | POA: Diagnosis not present

## 2018-09-25 DIAGNOSIS — Z8249 Family history of ischemic heart disease and other diseases of the circulatory system: Secondary | ICD-10-CM | POA: Insufficient documentation

## 2018-09-25 DIAGNOSIS — R7989 Other specified abnormal findings of blood chemistry: Secondary | ICD-10-CM | POA: Diagnosis not present

## 2018-09-25 DIAGNOSIS — Z8614 Personal history of Methicillin resistant Staphylococcus aureus infection: Secondary | ICD-10-CM | POA: Insufficient documentation

## 2018-09-25 DIAGNOSIS — Z66 Do not resuscitate: Secondary | ICD-10-CM | POA: Diagnosis not present

## 2018-09-25 DIAGNOSIS — E1149 Type 2 diabetes mellitus with other diabetic neurological complication: Secondary | ICD-10-CM | POA: Diagnosis not present

## 2018-09-25 DIAGNOSIS — F329 Major depressive disorder, single episode, unspecified: Secondary | ICD-10-CM | POA: Insufficient documentation

## 2018-09-25 DIAGNOSIS — I2510 Atherosclerotic heart disease of native coronary artery without angina pectoris: Secondary | ICD-10-CM | POA: Insufficient documentation

## 2018-09-25 DIAGNOSIS — M50221 Other cervical disc displacement at C4-C5 level: Secondary | ICD-10-CM | POA: Insufficient documentation

## 2018-09-25 DIAGNOSIS — Z6841 Body Mass Index (BMI) 40.0 and over, adult: Secondary | ICD-10-CM | POA: Insufficient documentation

## 2018-09-25 DIAGNOSIS — I200 Unstable angina: Secondary | ICD-10-CM

## 2018-09-25 DIAGNOSIS — R778 Other specified abnormalities of plasma proteins: Secondary | ICD-10-CM

## 2018-09-25 DIAGNOSIS — E119 Type 2 diabetes mellitus without complications: Secondary | ICD-10-CM | POA: Diagnosis not present

## 2018-09-25 DIAGNOSIS — S060X9D Concussion with loss of consciousness of unspecified duration, subsequent encounter: Secondary | ICD-10-CM | POA: Diagnosis not present

## 2018-09-25 DIAGNOSIS — M4802 Spinal stenosis, cervical region: Secondary | ICD-10-CM | POA: Diagnosis not present

## 2018-09-25 HISTORY — DX: Headache, unspecified: R51.9

## 2018-09-25 HISTORY — DX: Headache: R51

## 2018-09-25 HISTORY — DX: Atherosclerotic heart disease of native coronary artery without angina pectoris: I25.10

## 2018-09-25 HISTORY — DX: Other chest pain: R07.89

## 2018-09-25 HISTORY — DX: Disorder of arteries and arterioles, unspecified: I77.9

## 2018-09-25 HISTORY — DX: Peripheral vascular disease, unspecified: I73.9

## 2018-09-25 HISTORY — DX: Dizziness and giddiness: R42

## 2018-09-25 HISTORY — DX: Other ill-defined heart diseases: I51.89

## 2018-09-25 HISTORY — DX: Unspecified convulsions: R56.9

## 2018-09-25 LAB — CBC
HCT: 39.2 % (ref 36.0–46.0)
Hemoglobin: 12.9 g/dL (ref 12.0–15.0)
MCH: 28.4 pg (ref 26.0–34.0)
MCHC: 32.9 g/dL (ref 30.0–36.0)
MCV: 86.3 fL (ref 80.0–100.0)
Platelets: 280 10*3/uL (ref 150–400)
RBC: 4.54 MIL/uL (ref 3.87–5.11)
RDW: 13.7 % (ref 11.5–15.5)
WBC: 7.4 10*3/uL (ref 4.0–10.5)
nRBC: 0 % (ref 0.0–0.2)

## 2018-09-25 LAB — BASIC METABOLIC PANEL
Anion gap: 14 (ref 5–15)
BUN: 17 mg/dL (ref 8–23)
CO2: 20 mmol/L — ABNORMAL LOW (ref 22–32)
Calcium: 9.5 mg/dL (ref 8.9–10.3)
Chloride: 106 mmol/L (ref 98–111)
Creatinine, Ser: 1.19 mg/dL — ABNORMAL HIGH (ref 0.44–1.00)
GFR calc Af Amer: 57 mL/min — ABNORMAL LOW (ref >60)
GFR calc non Af Amer: 49 mL/min — ABNORMAL LOW (ref >60)
GLUCOSE: 274 mg/dL — AB (ref 70–99)
Potassium: 3.3 mmol/L — ABNORMAL LOW (ref 3.5–5.1)
Sodium: 140 mmol/L (ref 135–145)

## 2018-09-25 LAB — GLUCOSE, CAPILLARY
GLUCOSE-CAPILLARY: 240 mg/dL — AB (ref 70–99)
Glucose-Capillary: 149 mg/dL — ABNORMAL HIGH (ref 70–99)
Glucose-Capillary: 136 mg/dL — ABNORMAL HIGH (ref 70–99)
Glucose-Capillary: 155 mg/dL — ABNORMAL HIGH (ref 70–99)

## 2018-09-25 LAB — TROPONIN I
Troponin I: 0.04 ng/mL (ref <0.03)
Troponin I: 0.03 ng/mL (ref <0.03)
Troponin I: 0.03 ng/mL (ref <0.03)

## 2018-09-25 LAB — MAGNESIUM: Magnesium: 1.5 mg/dL — ABNORMAL LOW (ref 1.7–2.4)

## 2018-09-25 MED ORDER — METHOCARBAMOL 1000 MG/10ML IJ SOLN
500.0000 mg | Freq: Once | INTRAVENOUS | Status: AC
  Administered 2018-09-25: 500 mg via INTRAVENOUS
  Filled 2018-09-25: qty 5

## 2018-09-25 MED ORDER — ASPIRIN 81 MG PO CHEW
81.0000 mg | CHEWABLE_TABLET | Freq: Every day | ORAL | Status: DC
  Administered 2018-09-25 – 2018-09-26 (×2): 81 mg via ORAL
  Filled 2018-09-25 (×2): qty 1

## 2018-09-25 MED ORDER — NITROGLYCERIN 0.4 MG SL SUBL: 0.4000 mg | SUBLINGUAL_TABLET | SUBLINGUAL | Status: DC | PRN

## 2018-09-25 MED ORDER — HYDROMORPHONE HCL 1 MG/ML IJ SOLN
0.5000 mg | Freq: Once | INTRAMUSCULAR | Status: AC
  Administered 2018-09-25: 0.5 mg via INTRAVENOUS
  Filled 2018-09-25: qty 1

## 2018-09-25 MED ORDER — SODIUM CHLORIDE 0.9% FLUSH: 3.0000 mL | Freq: Once | INTRAVENOUS | Status: DC

## 2018-09-25 MED ORDER — HYDRALAZINE HCL 50 MG PO TABS
100.0000 mg | ORAL_TABLET | Freq: Three times a day (TID) | ORAL | Status: DC
  Administered 2018-09-25 – 2018-09-26 (×4): 100 mg via ORAL
  Filled 2018-09-25 (×4): qty 2

## 2018-09-25 MED ORDER — INSULIN DEGLUDEC 100 UNIT/ML ~~LOC~~ SOPN: 20.0000 [IU] | PEN_INJECTOR | Freq: Two times a day (BID) | SUBCUTANEOUS | Status: DC

## 2018-09-25 MED ORDER — SODIUM CHLORIDE 0.9 % IV BOLUS
1000.0000 mL | Freq: Once | INTRAVENOUS | Status: AC
  Administered 2018-09-25: 1000 mL via INTRAVENOUS

## 2018-09-25 MED ORDER — SODIUM CHLORIDE 0.9% FLUSH: 3.0000 mL | INTRAVENOUS | Status: DC | PRN

## 2018-09-25 MED ORDER — INSULIN GLARGINE 100 UNIT/ML ~~LOC~~ SOLN
20.0000 [IU] | Freq: Two times a day (BID) | SUBCUTANEOUS | Status: DC
  Administered 2018-09-25 (×2): 20 [IU] via SUBCUTANEOUS
  Filled 2018-09-25 (×5): qty 0.2

## 2018-09-25 MED ORDER — ASPIRIN 81 MG PO CHEW
324.0000 mg | CHEWABLE_TABLET | Freq: Once | ORAL | Status: AC
  Administered 2018-09-25: 324 mg via ORAL
  Filled 2018-09-25: qty 4

## 2018-09-25 MED ORDER — OXYBUTYNIN CHLORIDE 5 MG PO TABS
5.0000 mg | ORAL_TABLET | Freq: Two times a day (BID) | ORAL | Status: DC
  Administered 2018-09-25 – 2018-09-26 (×3): 5 mg via ORAL
  Filled 2018-09-25 (×5): qty 1

## 2018-09-25 MED ORDER — PANTOPRAZOLE SODIUM 40 MG PO TBEC
40.0000 mg | DELAYED_RELEASE_TABLET | Freq: Every day | ORAL | Status: DC
  Administered 2018-09-25 – 2018-09-26 (×2): 40 mg via ORAL
  Filled 2018-09-25 (×2): qty 1

## 2018-09-25 MED ORDER — ATORVASTATIN CALCIUM 20 MG PO TABS
20.0000 mg | ORAL_TABLET | Freq: Every day | ORAL | Status: DC
  Administered 2018-09-25 – 2018-09-26 (×2): 20 mg via ORAL
  Filled 2018-09-25 (×2): qty 1

## 2018-09-25 MED ORDER — ONDANSETRON HCL 4 MG PO TABS: 4.0000 mg | ORAL_TABLET | Freq: Four times a day (QID) | ORAL | Status: DC | PRN

## 2018-09-25 MED ORDER — ONDANSETRON HCL 4 MG/2ML IJ SOLN: 4.0000 mg | Freq: Four times a day (QID) | INTRAMUSCULAR | Status: DC | PRN

## 2018-09-25 MED ORDER — CLONIDINE HCL 0.1 MG PO TABS
0.3000 mg | ORAL_TABLET | Freq: Two times a day (BID) | ORAL | Status: DC
  Administered 2018-09-25 – 2018-09-26 (×3): 0.3 mg via ORAL
  Filled 2018-09-25 (×3): qty 3

## 2018-09-25 MED ORDER — VALPROATE SODIUM 500 MG/5ML IV SOLN
500.0000 mg | Freq: Once | INTRAVENOUS | Status: DC
  Administered 2018-09-25: 500 mg via INTRAVENOUS
  Filled 2018-09-25: qty 5

## 2018-09-25 MED ORDER — HYDRALAZINE HCL 20 MG/ML IJ SOLN: 10.0000 mg | Freq: Four times a day (QID) | INTRAMUSCULAR | Status: DC | PRN

## 2018-09-25 MED ORDER — MAGNESIUM SULFATE 2 GM/50ML IV SOLN
2.0000 g | Freq: Once | INTRAVENOUS | Status: AC
  Administered 2018-09-25: 2 g via INTRAVENOUS
  Filled 2018-09-25: qty 50

## 2018-09-25 MED ORDER — ENOXAPARIN SODIUM 40 MG/0.4ML ~~LOC~~ SOLN
40.0000 mg | Freq: Two times a day (BID) | SUBCUTANEOUS | Status: DC
  Administered 2018-09-25 – 2018-09-26 (×2): 40 mg via SUBCUTANEOUS
  Filled 2018-09-25 (×2): qty 0.4

## 2018-09-25 MED ORDER — ACETAMINOPHEN 650 MG RE SUPP: 650.0000 mg | Freq: Four times a day (QID) | RECTAL | Status: DC | PRN

## 2018-09-25 MED ORDER — ENALAPRIL MALEATE 20 MG PO TABS
20.0000 mg | ORAL_TABLET | Freq: Two times a day (BID) | ORAL | Status: DC
  Administered 2018-09-25 – 2018-09-26 (×3): 20 mg via ORAL
  Filled 2018-09-25: qty 2
  Filled 2018-09-25 (×3): qty 1

## 2018-09-25 MED ORDER — SODIUM CHLORIDE 0.9% FLUSH
3.0000 mL | Freq: Two times a day (BID) | INTRAVENOUS | Status: DC
  Administered 2018-09-25 – 2018-09-26 (×2): 3 mL via INTRAVENOUS

## 2018-09-25 MED ORDER — ONDANSETRON HCL 4 MG/2ML IJ SOLN
4.0000 mg | Freq: Once | INTRAMUSCULAR | Status: AC
  Administered 2018-09-25: 4 mg via INTRAVENOUS
  Filled 2018-09-25: qty 2

## 2018-09-25 MED ORDER — VALPROATE SODIUM 500 MG/5ML IV SOLN
500.0000 mg | Freq: Once | INTRAVENOUS | Status: DC
  Filled 2018-09-25: qty 5

## 2018-09-25 MED ORDER — HYDRALAZINE HCL 100 MG PO TABS: 100.0000 mg | ORAL_TABLET | Freq: Two times a day (BID) | ORAL | Status: DC

## 2018-09-25 MED ORDER — METFORMIN HCL 500 MG PO TABS
1000.0000 mg | ORAL_TABLET | Freq: Two times a day (BID) | ORAL | Status: DC
  Administered 2018-09-25 – 2018-09-26 (×3): 1000 mg via ORAL
  Filled 2018-09-25 (×3): qty 2

## 2018-09-25 MED ORDER — PROCHLORPERAZINE EDISYLATE 10 MG/2ML IJ SOLN
10.0000 mg | Freq: Once | INTRAMUSCULAR | Status: AC
  Administered 2018-09-25: 10 mg via INTRAVENOUS
  Filled 2018-09-25: qty 2

## 2018-09-25 MED ORDER — LIDOCAINE VISCOUS HCL 2 % MT SOLN
15.0000 mL | Freq: Once | OROMUCOSAL | Status: AC
  Administered 2018-09-25: 15 mL via ORAL
  Filled 2018-09-25: qty 15

## 2018-09-25 MED ORDER — HYDRALAZINE HCL 50 MG PO TABS
25.0000 mg | ORAL_TABLET | Freq: Three times a day (TID) | ORAL | Status: DC
  Administered 2018-09-25: 25 mg via ORAL
  Filled 2018-09-25: qty 1

## 2018-09-25 MED ORDER — ACETAMINOPHEN 325 MG PO TABS
650.0000 mg | ORAL_TABLET | Freq: Four times a day (QID) | ORAL | Status: DC | PRN
  Administered 2018-09-25: 650 mg via ORAL
  Filled 2018-09-25: qty 2

## 2018-09-25 MED ORDER — SODIUM CHLORIDE 0.9 % IV SOLN: 250.0000 mL | INTRAVENOUS | Status: DC | PRN

## 2018-09-25 MED ORDER — REGADENOSON 0.4 MG/5ML IV SOLN
0.4000 mg | Freq: Once | INTRAVENOUS | Status: AC
  Administered 2018-09-26: 0.4 mg via INTRAVENOUS
  Filled 2018-09-25: qty 5

## 2018-09-25 MED ORDER — INSULIN ASPART 100 UNIT/ML ~~LOC~~ SOLN
0.0000 [IU] | Freq: Three times a day (TID) | SUBCUTANEOUS | Status: DC
  Administered 2018-09-25: 1 [IU] via SUBCUTANEOUS
  Administered 2018-09-25: 3 [IU] via SUBCUTANEOUS
  Administered 2018-09-26: 1 [IU] via SUBCUTANEOUS
  Filled 2018-09-25 (×3): qty 1

## 2018-09-25 MED ORDER — BUTALBITAL-APAP-CAFFEINE 50-325-40 MG PO TABS
1.0000 | ORAL_TABLET | Freq: Four times a day (QID) | ORAL | Status: DC | PRN
  Administered 2018-09-25 – 2018-09-26 (×2): 1 via ORAL
  Filled 2018-09-25 (×2): qty 1

## 2018-09-25 MED ORDER — ACETAMINOPHEN 10 MG/ML IV SOLN
1000.0000 mg | Freq: Four times a day (QID) | INTRAVENOUS | Status: DC
  Filled 2018-09-25 (×2): qty 100

## 2018-09-25 MED ORDER — ENOXAPARIN SODIUM 40 MG/0.4ML ~~LOC~~ SOLN
40.0000 mg | SUBCUTANEOUS | Status: DC
  Administered 2018-09-25: 40 mg via SUBCUTANEOUS
  Filled 2018-09-25: qty 0.4

## 2018-09-25 MED ORDER — CLOPIDOGREL BISULFATE 75 MG PO TABS
75.0000 mg | ORAL_TABLET | Freq: Every day | ORAL | Status: DC
  Administered 2018-09-25 – 2018-09-26 (×2): 75 mg via ORAL
  Filled 2018-09-25 (×2): qty 1

## 2018-09-25 MED ORDER — METOPROLOL SUCCINATE ER 100 MG PO TB24
100.0000 mg | ORAL_TABLET | Freq: Every day | ORAL | Status: DC
  Administered 2018-09-25 – 2018-09-26 (×2): 100 mg via ORAL
  Filled 2018-09-25: qty 2
  Filled 2018-09-25: qty 1

## 2018-09-25 MED ORDER — ALUM & MAG HYDROXIDE-SIMETH 200-200-20 MG/5ML PO SUSP
30.0000 mL | Freq: Once | ORAL | Status: AC
  Administered 2018-09-25: 30 mL via ORAL
  Filled 2018-09-25: qty 30

## 2018-09-25 MED ORDER — POTASSIUM CHLORIDE CRYS ER 20 MEQ PO TBCR
40.0000 meq | EXTENDED_RELEASE_TABLET | Freq: Once | ORAL | Status: AC
  Administered 2018-09-25: 40 meq via ORAL
  Filled 2018-09-25: qty 2

## 2018-09-25 MED ORDER — LEVETIRACETAM 500 MG PO TABS
500.0000 mg | ORAL_TABLET | Freq: Two times a day (BID) | ORAL | Status: DC
  Administered 2018-09-25 – 2018-09-26 (×3): 500 mg via ORAL
  Filled 2018-09-25 (×3): qty 1

## 2018-09-25 MED ORDER — AMLODIPINE BESYLATE 10 MG PO TABS
10.0000 mg | ORAL_TABLET | Freq: Every day | ORAL | Status: DC
  Administered 2018-09-25 – 2018-09-26 (×2): 10 mg via ORAL
  Filled 2018-09-25: qty 2
  Filled 2018-09-25: qty 1

## 2018-09-25 NOTE — ED Notes (Signed)
Pt currently in CT.

## 2018-09-25 NOTE — Progress Notes (Signed)
PHARMACIST - PHYSICIAN COMMUNICATION  CONCERNING:  Enoxaparin (Lovenox) for DVT Prophylaxis   RECOMMENDATION: Patient was prescribed enoxaprin 40mg  q24 hours for VTE prophylaxis.   Filed Weights   09/25/18 0453  Weight: 242 lb 8.1 oz (110 kg)    Body mass index is 45.82 kg/m.  Estimated Creatinine Clearance: 56.3 mL/min (A) (by C-G formula based on SCr of 1.19 mg/dL (H)).  Based on [place] patient is candidate for enoxaparin 40mg  every 12 hour dosing due to BMI being >40.  DESCRIPTION: Pharmacy has adjusted enoxaparin dose per [facility] policy.  Patient is now receiving enoxaparin 40mg  every 12 hours.   Pernell Dupre, PharmD, BCPS Clinical Pharmacist 09/25/2018 11:24 AM

## 2018-09-25 NOTE — ED Notes (Signed)
ED TO INPATIENT HANDOFF REPORT  ED Nurse Name and Phone #: Carney Harder RN [phone]  S Name/Age/Gender Susan House 63 y.o. female Room/Bed: ED03A/ED03A  Code Status   Code Status: DNR  Home/SNF/Other Home Patient oriented to: self Is this baseline? Yes   Triage Complete: Triage complete  Chief Complaint headache  Triage Note Pt arrived to the ED accompanied by her husband for complaints of headache secondary to taking nitro for her chest pain. Pt's husband states that their MD gave them nitro for chest pain, but the Pt has no cardiac history. Pt reports that last night she had chest pain and took the nitro which gave her a bad headache. Pt had a previous stroke with deficit on her left side. Pt is AQx4 in moderate pain. MD notified.    Allergies Allergies  Allergen Reactions  . Morphine And Related Anaphylaxis  . Fentanyl Other (See Comments)    Unknown reaction.  . Reglan [Metoclopramide]     Other reaction(s): Unknown Elevated BP  . Simvastatin Other (See Comments)    Muscle pain  . Betadine [Povidone Iodine] Rash  . Iodine Rash    Other reaction(s): Unknown-not allergic to ct contrast-ars 04/01/18  . Tetracyclines & Related Rash    Other reaction(s): Unknown    Level of Care/Admitting Diagnosis ED Disposition    ED Disposition Condition [facility] Area: [place] [[id number]]  Level of Care: Telemetry [5]  Diagnosis: Chest pain [[id number]]  Admitting Physician: Dustin Flock [[id number]]  Attending Physician: Dustin Flock [[id number]]  PT Class (Do Not Modify): Observation [104]  PT Acc Code (Do Not Modify): Observation [10022]       B Medical/Surgery History Past Medical History:  Diagnosis Date  . Allergy   . Arthritis   . Atypical chest pain    a. Reportedly nl cath in the past; b. 12/2009 MV: No ischemia/infarct. EF 87%.  . Carotid arterial disease ([place])    a. 10/3327 CTA head/neck: LICA 51O, RICA 84-->Z[facility] rx.  .  Cellulitis and abscess of face   . Chronic Dizziness   . Coronary artery calcification seen on CT scan    a. 10/2016 CTA chest: Atherosclerotic calcifications Ao, cors, and prox great vessels.  . Depression   . Diabetes mellitus ([place])   . Diastolic dysfunction    a. 2008 Echo: Nl EF; b. 2011 Echo: Nl EF; c. 06/2016 Echo: EF 55-60%, Gr1DD; d. 03/2018 Echo: EF 55-60%, no rwma, Gr1DD, mod dil LA.  . Edema   . GERD (gastroesophageal reflux disease)   . Headache   . Hematuria   . Hyperlipidemia   . Hypertension   . IBS (irritable bowel syndrome)   . Migraine   . Morbid obesity ([place])   . Nocturia   . Possible Seizure ([place])    a. 10/2016 ? sz. MRI neg for stroke.  . Stroke ([place]) 2000  . TIA (transient ischemic attack) 2010 & 2019   a. 2010; b. 03/2018 Left sided wkns-->MRI neg for stroke. CTA head/neck: Right A2 cut off/occlusion w/ collats extending over R cerebral convexity. LICA 06T, RICA 45, RSubclavian 50->med rx.  . Urgency of micturation   . Urinary frequency   . Urinary incontinence    Past Surgical History:  Procedure Laterality Date  . ABDOMINAL HYSTERECTOMY    . ABDOMINOPLASTY    . APPENDECTOMY    . BACK SURGERY     extensive spinal fusion  . carpel tunnel surgery    .  CATARACT EXTRACTION     x 2  . CESAREAN SECTION    . CHOLECYSTECTOMY    . COLONOSCOPY  2010  . COLONOSCOPY WITH PROPOFOL N/A 07/11/2016   Procedure: COLONOSCOPY WITH PROPOFOL;  Surgeon: Lucilla Lame, MD;  Location: [facility] ENDOSCOPY;  Service: Endoscopy;  Laterality: N/A;  . COLONOSCOPY WITH PROPOFOL N/A 03/26/2018   Procedure: COLONOSCOPY WITH PROPOFOL;  Surgeon: Lucilla Lame, MD;  Location: [facility] ENDOSCOPY;  Service: Endoscopy;  Laterality: N/A;  . ESOPHAGOGASTRODUODENOSCOPY (EGD) WITH PROPOFOL N/A 03/26/2018   Procedure: ESOPHAGOGASTRODUODENOSCOPY (EGD) WITH PROPOFOL;  Surgeon: Lucilla Lame, MD;  Location: [facility] ENDOSCOPY;  Service: Endoscopy;  Laterality: N/A;  . EYE SURGERY    . HERNIA REPAIR    . SHOULDER  SURGERY    . TOE SURGERY    . UPPER GI ENDOSCOPY    . WRIST FRACTURE SURGERY Left      A IV Location/Drains/Wounds Patient Lines/Drains/Airways Status   Active Line/Drains/Airways    Name:   Placement date:   Placement time:   Site:   Days:   Peripheral IV 09/25/18 Right Antecubital   09/25/18    0528    Antecubital   less than 1          Intake/Output Last 24 hours  Intake/Output Summary (Last 24 hours) at 09/25/2018 1327 Last data filed at 09/25/2018 1200 Gross per 24 hour  Intake 1050 ml  Output -  Net 1050 ml    Labs/Imaging Results for orders placed or performed during the hospital encounter of 09/25/18 (from the past 48 hour(s))  Basic metabolic panel     Status: Abnormal   Collection Time: 09/25/18  5:25 AM  Result Value Ref Range   Sodium 140 135 - 145 mmol/L   Potassium 3.3 (L) 3.5 - 5.1 mmol/L   Chloride 106 98 - 111 mmol/L   CO2 20 (L) 22 - 32 mmol/L   Glucose, Bld 274 (H) 70 - 99 mg/dL   BUN 17 8 - 23 mg/dL   Creatinine, Ser 1.19 (H) 0.44 - 1.00 mg/dL   Calcium 9.5 8.9 - 10.3 mg/dL   GFR calc non Af Amer 49 (L) >60 mL/min   GFR calc Af Amer 57 (L) >60 mL/min   Anion gap 14 5 - 15    Comment: Performed at [facility], [address] [place], [place] [id number]  CBC     Status: None   Collection Time: 09/25/18  5:25 AM  Result Value Ref Range   WBC 7.4 4.0 - 10.5 K/uL   RBC 4.54 3.87 - 5.11 MIL/uL   Hemoglobin 12.9 12.0 - 15.0 g/dL   HCT 39.2 36.0 - 46.0 %   MCV 86.3 80.0 - 100.0 fL   MCH 28.4 26.0 - 34.0 pg   MCHC 32.9 30.0 - 36.0 g/dL   RDW 13.7 11.5 - 15.5 %   Platelets 280 150 - 400 K/uL   nRBC 0.0 0.0 - 0.2 %    Comment: Performed at [facility], [address] [place], [place] [address]  Troponin I - ONCE - STAT     Status: Abnormal   Collection Time: 09/25/18  5:25 AM  Result Value Ref Range   Troponin I 0.04 (HH) <0.03 ng/mL    Comment: CRITICAL RESULT CALLED TO, READ BACK BY AND VERIFIED WITH ANNIE SMITH AT 0559  09/25/2018 SMA Performed at [facility], [address], [place], [place] [address]   Troponin I -     Status:  Abnormal   Collection Time: 09/25/18  7:52 AM  Result Value Ref Range   Troponin I 0.03 (HH) <0.03 ng/mL    Comment: CRITICAL VALUE NOTED. VALUE IS CONSISTENT WITH PREVIOUSLY REPORTED/CALLED VALUE RDW/SDR Performed at [facility], [address] [place], [place] [address]   Glucose, capillary     Status: Abnormal   Collection Time: 09/25/18 12:21 PM  Result Value Ref Range   Glucose-Capillary 240 (H) 70 - 99 mg/dL   Dg Chest 2 View  Result Date: 09/25/2018 CLINICAL DATA:  63 y/o  F; chest pain. EXAM: CHEST - 2 VIEW COMPARISON:  06/26/2018 chest radiograph FINDINGS: Normal cardiac silhouette. Aortic atherosclerosis with calcification. Clear lungs. No pleural effusion or pneumothorax. No acute osseous abnormality is evident. Right upper quadrant cholecystectomy clips. Chronic fracture deformity of the right mid and distal clavicle. IMPRESSION: No acute pulmonary process identified. Electronically Signed   By: Kristine Garbe M.D.   On: 09/25/2018 05:25   Ct Head Wo Contrast  Result Date: 09/25/2018 CLINICAL DATA:  Acute headache with normal neuro exam EXAM: CT HEAD WITHOUT CONTRAST TECHNIQUE: Contiguous axial images were obtained from the base of the skull through the vertex without intravenous contrast. COMPARISON:  09/21/2018 FINDINGS: Brain: Large remote left basal ganglia infarct, lateral lenticulostriate distribution. Cerebral volume loss with ventriculomegaly. No acute infarct, hemorrhage, hydrocephalus, collection, or masslike finding Vascular: Atherosclerotic calcification without hyperdense vessel Skull: No acute finding. Sinuses/Orbits: Bilateral cataract resection. IMPRESSION: 1. No acute finding or change from recent prior. 2. Remote left basal ganglia infarction. Electronically Signed   By: Monte Fantasia M.D.   On: 09/25/2018 05:17   Ct  Cervical Spine Wo Contrast  Result Date: 09/25/2018 CLINICAL DATA:  Neck pain. EXAM: CT CERVICAL SPINE WITHOUT CONTRAST TECHNIQUE: Multidetector CT imaging of the cervical spine was performed without intravenous contrast. Multiplanar CT image reconstructions were also generated. COMPARISON:  CT angiogram of the neck dated 04/01/2018 FINDINGS: Alignment: There is straightening of the cervical lordosis. 1.5 mm retrolisthesis of C6 on C7. Skull base and vertebrae: No fracture or bone destruction. No significant facet arthritis. Soft tissues and spinal canal: No prevertebral fluid or swelling. No visible canal hematoma. Disc levels:  C2-3: Negative. C3-4: Negative. C4-5: Tiny partially calcified disc bulge in the midline with no neural impingement. C5-6: Minimal disc space narrowing. Small broad-based disc bulge with no neural impingement. Widely patent neural foramina. C6-7: Disc space narrowing. Small broad-based disc bulge with accompanying osteophytes without neural impingement. Widely patent neural foramina. C7-T1: Focal calcification in the left ligamentum flavum no consequence. No disc bulging or protrusion. No foraminal stenosis. Upper chest: Aortic atherosclerosis.  Lung apices are normal. Other: Prominent calcification in the carotid bifurcations bilaterally. Calcification in the carotid siphons and in the distal vertebral arteries. IMPRESSION: 1. No acute abnormality of the cervical spine. 2. 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 3. Atherosclerotic changes in the carotid and vertebral arteries and in the arch of the aorta. Electronically Signed   By: Lorriane Shire M.D.   On: 09/25/2018 11:45   Mr Brain Wo Contrast  Result Date: 09/25/2018 CLINICAL DATA:  Acute presentation with headache. EXAM: MRI HEAD WITHOUT CONTRAST TECHNIQUE: Multiplanar, multiecho pulse sequences of the brain and surrounding structures were obtained without intravenous contrast.  COMPARISON:  Head CT same day.  MRI 04/01/2018 FINDINGS: Brain: Diffusion imaging does not show any acute or subacute infarction. Chronic small-vessel ischemic changes and wall air in degeneration affect the pons. No focal  cerebellar insult. Cerebral hemispheres show old infarction in the right radiating white matter tracts and extensive old infarction in the left basal ganglia and radiating white matter tracts. Chronic small-vessel ischemic changes affect the deep white matter. No mass lesion, acute hemorrhage, hydrocephalus or extra-axial collection. Hemosiderin deposition is present in the old infarction regions. Ex vacuo enlargement of the ventricles without suspicion of obstructive hydrocephalus. Vascular: Major vessels at the base of the brain show flow. Skull and upper cervical spine: Negative Sinuses/Orbits: Clear/normal Other: None IMPRESSION: No change seen since September of last year. Old infarctions at the base of the brain bilaterally, more extensive on the left than the right. Chronic small-vessel ischemic changes. No evidence of acute or subacute infarction. Electronically Signed   By: Nelson Chimes M.D.   On: 09/25/2018 12:16    Pending Labs Unresulted Labs (From admission, onward)    Start     Ordered   10/02/18 0500  Creatinine, serum  (enoxaparin (LOVENOX)    CrCl >/= 30 ml/min)  Weekly,   STAT    Comments:  while on enoxaparin therapy    09/25/18 0921   09/26/18 3536  Basic metabolic panel  Tomorrow morning,   STAT     09/25/18 0921   09/25/18 1246  Magnesium  Add-on,   AD     09/25/18 1245   09/25/18 0810  Troponin I - Now Then Q6H  Now then every 6 hours,   STAT     09/25/18 0809          Vitals/Pain Today's Vitals   09/25/18 1037 09/25/18 1100 09/25/18 1230 09/25/18 1314  BP: (!) 175/74 (!) 153/59 140/64 (!) 150/66  Pulse: 78   63  Resp: 15   16  Temp:      TempSrc:      SpO2: 95%   98%  Weight:      Height:      PainSc:    6     Isolation Precautions No  active isolations  Medications Medications  sodium chloride flush (NS) 0.9 % injection 3 mL (3 mLs Intravenous Not Given 09/25/18 0730)  butalbital-acetaminophen-caffeine (FIORICET, ESGIC) 50-325-40 MG per tablet 1 tablet (has no administration in time range)  amLODipine (NORVASC) tablet 10 mg (10 mg Oral Given 09/25/18 0945)  atorvastatin (LIPITOR) tablet 20 mg (20 mg Oral Given 09/25/18 0937)  cloNIDine (CATAPRES) tablet 0.3 mg (0.3 mg Oral Given 09/25/18 0944)  enalapril (VASOTEC) tablet 20 mg (20 mg Oral Given 09/25/18 1316)  metoprolol succinate (TOPROL-XL) 24 hr tablet 100 mg (100 mg Oral Given 09/25/18 0941)  nitroGLYCERIN (NITROSTAT) SL tablet 0.4 mg (has no administration in time range)  metFORMIN (GLUCOPHAGE) tablet 1,000 mg (1,000 mg Oral Given 09/25/18 0937)  pantoprazole (PROTONIX) EC tablet 40 mg (40 mg Oral Given 09/25/18 0944)  oxybutynin (DITROPAN) tablet 5 mg (5 mg Oral Given 09/25/18 1316)  clopidogrel (PLAVIX) tablet 75 mg (75 mg Oral Given 09/25/18 0945)  levETIRAcetam (KEPPRA) tablet 500 mg (500 mg Oral Given 09/25/18 0945)  sodium chloride flush (NS) 0.9 % injection 3 mL (3 mLs Intravenous Not Given 09/25/18 1234)  sodium chloride flush (NS) 0.9 % injection 3 mL (has no administration in time range)  0.9 %  sodium chloride infusion (has no administration in time range)  acetaminophen (TYLENOL) tablet 650 mg (650 mg Oral Given 09/25/18 0909)    Or  acetaminophen (TYLENOL) suppository 650 mg ( Rectal See Alternative 09/25/18 0909)  ondansetron (ZOFRAN) tablet 4 mg (has no  administration in time range)    Or  ondansetron (ZOFRAN) injection 4 mg (has no administration in time range)  hydrALAZINE (APRESOLINE) injection 10 mg (has no administration in time range)  aspirin chewable tablet 81 mg (81 mg Oral Given 09/25/18 0908)  insulin aspart (novoLOG) injection 0-9 Units (3 Units Subcutaneous Given 09/25/18 1229)  hydrALAZINE (APRESOLINE) tablet 100 mg (100 mg Oral Not Given 09/25/18  1058)  insulin glargine (LANTUS) injection 20 Units (20 Units Subcutaneous Given 09/25/18 1230)  valproate (DEPACON) 500 mg in dextrose 5 % 50 mL IVPB (500 mg Intravenous Not Given 09/25/18 1242)  enoxaparin (LOVENOX) injection 40 mg (has no administration in time range)  regadenoson (LEXISCAN) injection SOLN 0.4 mg (has no administration in time range)  sodium chloride 0.9 % bolus 1,000 mL (0 mLs Intravenous Stopped 09/25/18 0807)  aspirin chewable tablet 324 mg (324 mg Oral Given 09/25/18 0621)  HYDROmorphone (DILAUDID) injection 0.5 mg (0.5 mg Intravenous Given 09/25/18 0622)  ondansetron (ZOFRAN) injection 4 mg (4 mg Intravenous Given 09/25/18 0622)  alum & mag hydroxide-simeth (MAALOX/MYLANTA) 200-200-20 MG/5ML suspension 30 mL (30 mLs Oral Given 09/25/18 0717)    And  lidocaine (XYLOCAINE) 2 % viscous mouth solution 15 mL (15 mLs Oral Given 09/25/18 0717)  HYDROmorphone (DILAUDID) injection 0.5 mg (0.5 mg Intravenous Given 09/25/18 0756)  methocarbamol (ROBAXIN) 500 mg in dextrose 5 % 50 mL IVPB (0 mg Intravenous Stopped 09/25/18 1006)  prochlorperazine (COMPAZINE) injection 10 mg (10 mg Intravenous Given 09/25/18 0911)  potassium chloride SA (K-DUR,KLOR-CON) CR tablet 40 mEq (40 mEq Oral Given 09/25/18 0909)    Mobility power wheelchair High fall risk   Focused Assessments    R Recommendations: See Admitting Provider Note  Report given to:   Additional Notes: Pt uses a wheelchair at home.

## 2018-09-25 NOTE — ED Notes (Signed)
Date and time results received: 09/25/18 0602 (use smartphrase ".now" to insert current time)  Test: Troponin Critical Value: 0.04  Name of Provider Notified: Dr. Rip Harbour  Orders Received? Or Actions Taken?: Orders Received - See Orders for details

## 2018-09-25 NOTE — Consult Note (Addendum)
Reason for Consult:Headache Referring Physician: Dustin Flock, MD  CC: Headache, chest pain  HPI: Susan House is an 63 y.o. 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 She reports onset of headache since her traumatic fall on 09/21/2018 and has progressively increased in frequency, intensity and severity. The headache is intermittent, moderate to severe in intensity, located predominantly in the occipital region, neck, left supraorbital and left parietal area. She describes pain as throbbing, pressure and dull aching. Headaches does not responds to over the counter analgesics. Headache is associated with nausea, word finding difficulty and vision disturbances. Denies light and noise sensitivity, irritability or difficulty with concentration. Denies associated altered sensorium, speech abnormality, cranial nerve deficit, seizures like activity,  focal motor or sensory deficits, syncope or LOC, paresthesia (numbness, tingling, pins-and-needles sensation) or a heavy feeling in an extremity. A non contrast CT head shows no acute intracranial abnormality. Patient continues to complain of headache rating it at a 6/10 on a pain intensity rating scale.  Past Medical History:  Diagnosis Date  . Allergy   . Arthritis   . Atypical chest pain    a. Reportedly nl cath in the past; b. 12/2009 MV: No ischemia/infarct. EF 87%.  . Carotid arterial disease ([place])    a. 02/5026 CTA head/neck: LICA 74J, RICA 28-->NOM rx.  . Cellulitis and abscess of face   . Chronic Dizziness   . Depression   . Diabetes mellitus ([place])   . Diastolic dysfunction    a. 2008 Echo: Nl EF; b. 2011 Echo: Nl EF; c. 06/2016 Echo:  EF 55-60%, Gr1DD; d. 03/2018 Echo: EF 55-60%, no rwma, Gr1DD, mod dil LA.  . Edema   . GERD (gastroesophageal reflux disease)   . Headache   . Hematuria   . Hyperlipidemia   . Hypertension   . IBS (irritable bowel syndrome)   . Migraine   . Morbid obesity ([place])   . Nocturia   . Possible Seizure ([place])    a. 10/2016 ? sz. MRI neg for stroke.  . Stroke ([place]) 2000  . TIA (transient ischemic attack) 2010 & 2019   a. 2010; b. 03/2018 Left sided wkns-->MRI neg for stroke. CTA head/neck: Right A2 cut off/occlusion w/ collats extending over R cerebral convexity. LICA 76H, RICA 45, RSubclavian 50->med rx.  . Urgency of micturation   . Urinary frequency   . Urinary incontinence     Past Surgical History:  Procedure Laterality Date  . ABDOMINAL HYSTERECTOMY    . ABDOMINOPLASTY    . APPENDECTOMY    . BACK SURGERY     extensive spinal fusion  . carpel tunnel surgery    . CATARACT EXTRACTION     x 2  . CESAREAN SECTION    . CHOLECYSTECTOMY    . COLONOSCOPY  2010  . COLONOSCOPY WITH PROPOFOL N/A 07/11/2016   Procedure: COLONOSCOPY WITH PROPOFOL;  Surgeon: Lucilla Lame, MD;  Location: [facility] ENDOSCOPY;  Service: Endoscopy;  Laterality: N/A;  . COLONOSCOPY WITH PROPOFOL N/A 03/26/2018   Procedure: COLONOSCOPY WITH PROPOFOL;  Surgeon: Lucilla Lame, MD;  Location: [facility] ENDOSCOPY;  Service: Endoscopy;  Laterality: N/A;  . ESOPHAGOGASTRODUODENOSCOPY (EGD) WITH PROPOFOL N/A 03/26/2018   Procedure: ESOPHAGOGASTRODUODENOSCOPY (EGD) WITH PROPOFOL;  Surgeon: Lucilla Lame, MD;  Location:  [place] ENDOSCOPY;  Service: Endoscopy;  Laterality: N/A;  . EYE SURGERY    . HERNIA REPAIR    . SHOULDER SURGERY    . TOE SURGERY    . UPPER GI ENDOSCOPY    . WRIST FRACTURE SURGERY Left     Family History  Problem Relation Age of Onset  . Hyperlipidemia Mother   . Arrhythmia Mother        WPW  . Hypertension Mother   . Rheum arthritis Mother   . Heart disease Mother   . Heart attack Father 66  . Hyperlipidemia  Father   . Hypertension Father   . Stroke Father   . Diabetes Father   . Heart disease Father   . Coronary artery disease Father   . Diabetes Sister   . Hyperlipidemia Sister   . Hypertension Sister   . Depression Sister   . Depression Sister   . Diabetes Sister   . Hypertension Sister   . Hyperlipidemia Sister   . Kidney disease Paternal Grandmother     Social History:  reports that she has never smoked. She has never used smokeless tobacco. She reports that she does not drink alcohol or use drugs.  Allergies  Allergen Reactions  . Morphine And Related Anaphylaxis  . Fentanyl Other (See Comments)    Unknown reaction.  . Reglan [Metoclopramide]     Other reaction(s): Unknown Elevated BP  . Simvastatin Other (See Comments)    Muscle pain  . Betadine [Povidone Iodine] Rash  . Iodine Rash    Other reaction(s): Unknown-not allergic to ct contrast-ars 04/01/18  . Tetracyclines & Related Rash    Other reaction(s): Unknown    Medications:  I have reviewed the patient's current medications. Prior to Admission: (Not in a hospital admission)  Scheduled: . amLODipine  10 mg Oral Daily  . aspirin  81 mg Oral Daily  . atorvastatin  20 mg Oral Daily  . cloNIDine  0.3 mg Oral BID  . clopidogrel  75 mg Oral Daily  . enalapril  20 mg Oral BID  . enoxaparin (LOVENOX) injection  40 mg Subcutaneous Q24H  . hydrALAZINE  100 mg Oral Q8H  . insulin aspart  0-9 Units Subcutaneous TID WC  . insulin glargine  20 Units Subcutaneous BID  . levETIRAcetam  500 mg Oral BID  . metFORMIN  1,000 mg Oral BID  . metoprolol succinate  100 mg Oral Daily  . oxybutynin  5 mg Oral BID  . pantoprazole  40 mg Oral Daily  . sodium chloride flush  3 mL Intravenous Once  . sodium chloride flush  3 mL Intravenous Q12H    ROS: History obtained from the patient   General ROS: negative for - chills, fatigue, fever, night sweats, weight gain or weight loss Psychological ROS: negative for - behavioral  disorder, hallucinations, memory difficulties, mood swings or suicidal ideation Ophthalmic ROS: negative for - double vision, eye pain or loss of vision. Positive for blurry vision ENT ROS: negative for - epistaxis, nasal discharge, oral lesions, sore throat, tinnitus or vertigo Allergy and Immunology ROS: negative for - hives or itchy/watery eyes Hematological and Lymphatic ROS: negative for - bleeding problems, bruising or swollen lymph nodes Endocrine ROS: negative for - galactorrhea, hair pattern changes, polydipsia/polyuria or temperature intolerance Respiratory ROS: negative for - cough, hemoptysis, shortness of breath or wheezing Cardiovascular ROS: negative for - dyspnea on exertion, edema or irregular heartbeat. Positive for chest pain Gastrointestinal ROS: negative for - abdominal pain,  diarrhea, hematemesis, vomiting or stool incontinence. Positive for nausea Genito-Urinary ROS: negative for - dysuria, hematuria, incontinence or urinary frequency/urgency Musculoskeletal ROS: negative for - joint swelling or muscular weakness Neurological ROS: as noted in HPI Dermatological ROS: negative for rash and skin lesion changes  Physical Examination: Blood pressure (!) 172/66, pulse 76, temperature 97.8 F (36.6 C), resp. rate 18, height 5\' 1"  (1.549 m), weight 110 kg, SpO2 98 %.  HEENT-  Left facial bruising and swelling.  Normal external eye and conjunctiva.  Normal TM's bilaterally.  Normal auditory canals and external ears. Normal external nose, mucus membranes and septum.  Normal pharynx. Cardiovascular- S1, S2 normal, pulses palpable throughout   Lungs- chest clear, no wheezing, rales, normal symmetric air entry Abdomen- soft, non-tender; bowel sounds normal; no masses,  no organomegaly Extremities- no edema Lymph-no adenopathy palpable Musculoskeletal-no joint tenderness, left hand contracture Skin-warm and dry, no hyperpigmentation, vitiligo, or suspicious lesions  Mental  Status: Alert, oriented, thought content appropriate. Speech clear without evidence of aphasia. Able to follow 3 step commands without difficulty. Attention span and concentration seemed appropriate  Cranial Nerves: II: Discs flat bilaterally; LHH, pupils equal, round, reactive to light and accommodation III,IV, VI: ptosis not present, extra-ocular motions intact bilaterally V,VII: smile asymmetric mildly on the left, facial light touch sensationdecreased on the left VIII: hearing normal bilaterally IX,X: gag reflex differed XI: bilateral shoulder shrug XII: midline tongue extension Motor: Right :Upper extremity 5/5Without pronator driftLeft: Upper extremity 3/5 Right:Lower extremity 5/5Left: Lower extremity 3-/5 Increased tone on the left Sensory: Pinprick and light touchdecreased on the left hand Deep Tendon Reflexes: 2+ and symmetric throughout Plantars: Right:muteLeft: upgoing Cerebellar: Finger-to-nosetesting and heel to chin intact on the right. Unable to perform on the left due to hemiparesis and contracture of hand. Gait: not tested due to safety concerns  Data Reviewed  Laboratory Studies:   Basic Metabolic Panel: Recent Labs  Lab 09/25/18 0525  NA 140  K 3.3*  CL 106  CO2 20*  GLUCOSE 274*  BUN 17  CREATININE 1.19*  CALCIUM 9.5    Liver Function Tests: No results for input(s): AST, ALT, ALKPHOS, BILITOT, PROT, ALBUMIN in the last 168 hours. No results for input(s): LIPASE, AMYLASE in the last 168 hours. No results for input(s): AMMONIA in the last 168 hours.  CBC: Recent Labs  Lab 09/25/18 0525  WBC 7.4  HGB 12.9  HCT 39.2  MCV 86.3  PLT 280    Cardiac Enzymes: Recent Labs  Lab 09/25/18 0525 09/25/18 0752  TROPONINI 0.04* 0.03*    BNP: Invalid input(s): POCBNP  CBG: No results for input(s): GLUCAP in the last 168  hours.  Microbiology: Results for orders placed or performed in visit on 08/23/18  Urine Culture     Status: Abnormal   Collection Time: 08/23/18  2:53 PM  Result Value Ref Range Status   Urine Culture, Routine Final report (A)  Final   Organism ID, Bacteria Klebsiella pneumoniae (A)  Final    Comment: Greater than 100,000 colony forming units per mL Cefazolin <=4 ug/mL Cefazolin with an MIC <=16 predicts susceptibility to the oral agents cefaclor, cefdinir, cefpodoxime, cefprozil, cefuroxime, cephalexin, and loracarbef when used for therapy of uncomplicated urinary tract infections due to E. coli, Klebsiella pneumoniae, and Proteus mirabilis.    Antimicrobial Susceptibility Comment  Final    Comment:       ** S = Susceptible; I = Intermediate; R = Resistant **  P = Positive; N = Negative             MICS are expressed in micrograms per mL    Antibiotic                 RSLT#1    RSLT#2    RSLT#3    RSLT#4 Amoxicillin/Clavulanic Acid    S Ampicillin                     R Cefepime                       S Ceftriaxone                    S Cefuroxime                     S Ciprofloxacin                  S Ertapenem                      S Gentamicin                     S Imipenem                       S Levofloxacin                   S Meropenem                      S Nitrofurantoin                 S Piperacillin/Tazobactam        S Tetracycline                   S Tobramycin                     S Trimethoprim/Sulfa             S     Coagulation Studies: No results for input(s): LABPROT, INR in the last 72 hours.  Urinalysis:  Recent Labs  Lab 09/20/18 0952  COLORURINE YELLOW*  LABSPEC 1.012  PHURINE 6.0  GLUCOSEU 150*  HGBUR SMALL*  BILIRUBINUR NEGATIVE  KETONESUR NEGATIVE  PROTEINUR 100*  NITRITE POSITIVE*  LEUKOCYTESUR LARGE*    Lipid Panel:     Component Value Date/Time   CHOL 102 04/01/2018 0458   CHOL 110 01/18/2014 0059   TRIG 198 (H)  04/01/2018 0458   TRIG 122 01/18/2014 0059   HDL 29 (L) 04/01/2018 0458   HDL 35 (L) 01/18/2014 0059   CHOLHDL 3.5 04/01/2018 0458   VLDL 40 04/01/2018 0458   VLDL 24 01/18/2014 0059   LDLCALC 33 04/01/2018 0458   LDLCALC 51 01/18/2014 0059    HgbA1C:  Lab Results  Component Value Date   HGBA1C 10.5 (H) 08/30/2018    Urine Drug Screen:  No results found for: LABOPIA, COCAINSCRNUR, LABBENZ, AMPHETMU, THCU, LABBARB  Alcohol Level: No results for input(s): ETH in the last 168 hours.  Other results: EKG: sinus rhythm at 71 bpm.  Imaging: Dg Chest 2 View  Result Date: 09/25/2018 CLINICAL DATA:  63 y/o  F; chest pain. EXAM: CHEST - 2 VIEW COMPARISON:  06/26/2018 chest radiograph FINDINGS: Normal cardiac silhouette. Aortic atherosclerosis with calcification. Clear lungs.  No pleural effusion or pneumothorax. No acute osseous abnormality is evident. Right upper quadrant cholecystectomy clips. Chronic fracture deformity of the right mid and distal clavicle. IMPRESSION: No acute pulmonary process identified. Electronically Signed   By: Kristine Garbe M.D.   On: 09/25/2018 05:25   Ct Head Wo Contrast  Result Date: 09/25/2018 CLINICAL DATA:  Acute headache with normal neuro exam EXAM: CT HEAD WITHOUT CONTRAST TECHNIQUE: Contiguous axial images were obtained from the base of the skull through the vertex without intravenous contrast. COMPARISON:  09/21/2018 FINDINGS: Brain: Large remote left basal ganglia infarct, lateral lenticulostriate distribution. Cerebral volume loss with ventriculomegaly. No acute infarct, hemorrhage, hydrocephalus, collection, or masslike finding Vascular: Atherosclerotic calcification without hyperdense vessel Skull: No acute finding. Sinuses/Orbits: Bilateral cataract resection. IMPRESSION: 1. No acute finding or change from recent prior. 2. Remote left basal ganglia infarction. Electronically Signed   By: Monte Fantasia M.D.   On: 09/25/2018 05:17   Patient  seen and examined.  Clinical course and management discussed.  Necessary edits performed.  I agree with the above.  Assessment and plan of care developed and discussed below.    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 Onset of headache since recent concussion with traumatic left facial injury. Concerns for post concussive headache.  Plan: 1. CT cervical spine 2. MRI Brain without contrast 3. Agree with Depakote for headache relief but will need to infuse over 15 minutes or this will not be helpful.    This patient was staffed with Dr. Magda Paganini, Doy Mince who personally evaluated patient, reviewed documentation and agreed with assessment and plan of care as above.  Rufina Falco, DNP, FNP-BC Board certified Nurse Practitioner Neurology Department   09/25/2018, 10:25 AM   Addendum: MRI of the brain reviewed and shows no acute changes.  CT of the cervical spine shows no evidence of fracture.  Post traumatic headache likely etiology with other postconcussive symptoms.  Disease process and prognosis discussed.    Recommendations: 1.  Would consider Ultracet prn headache.  Would not expect Fioricet to be helpful.  If headache recurrent daily would consider a low dose tricyclics, Elavil at 25mg  qhs for headache prophylaxis.     Alexis Goodell, MD Neurology [phone]  09/25/2018  3:31 PM

## 2018-09-25 NOTE — ED Notes (Signed)
Pt states headache started after falling on Saturday on her left side. Pt states "I felt and hit the left side of my head" denies LOC. Pt c/o intermittent headache at this time. Pt C/o left sided chest pain.
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
## 2018-09-25 NOTE — ED Notes (Signed)
Meal tray at bedside.  

## 2018-09-25 NOTE — ED Triage Notes (Signed)
Pt arrived to the ED accompanied by her husband for complaints of headache secondary to taking nitro for her chest pain. Pt's husband states that their MD gave them nitro for chest pain, but the Pt has no cardiac history. Pt reports that last night she had chest pain and took the nitro which gave her a bad headache. Pt had a previous stroke with deficit on her left side. Pt is AQx4 in moderate pain. MD notified.

## 2018-09-25 NOTE — H&P (Signed)
Susan House at [place] NAME: Susan House    MR#:  [id number]  DATE OF BIRTH:  04-Sep-1955  DATE OF ADMISSION:  09/25/2018  PRIMARY CARE PHYSICIAN: Jerrol Banana., MD   REQUESTING/REFERRING PHYSICIAN:   CHIEF COMPLAINT:   Chief Complaint  Patient presents with  . Headache  . Chest Pain    HISTORY OF PRESENT ILLNESS: Susan House  is a 63 y.o. female with a known history of osteoarthritis, diabetes type 2, GERD, hypertension, hyperlipidemia, previous history of CVA presenting to the hospital with complaint of chest pain and headache.  Patient states that she fell 2 weeks ago approximately and tripped.  She developed significant bruising on the left side of her face.  She does have a history of migraine but since the fall she has had episodes of headaches.  Patient actually was seen in the emergency room on March 6 and discharged home.  Now returns back with headache.  She reports that the headache is all over her head it is a dull aching pain.  Also on the left side is a little worse.  She has not had any nausea vomiting or diarrhea.  Along with a headache she is also having chest pain since last night. Pt is having left sided chest pain.  She is having dull aching pain. She continues to have chest pressure. No radiation of pain.   PAST MEDICAL HISTORY:   Past Medical History:  Diagnosis Date  . Allergy   . Arthritis   . Cellulitis and abscess of face   . Depression   . Diabetes mellitus ([place])   . Edema   . GERD (gastroesophageal reflux disease)   . Hematuria   . History of echocardiogram    2008: normal, 2011: normal LVSF  . History of nuclear stress test    a. 12/2009: lexiscan - negative  . Hyperlipidemia   . Hypertension   . IBS (irritable bowel syndrome)   . Migraine   . Morbid obesity ([place])   . Nocturia   . Stroke ([place]) 2000  . TIA (transient ischemic attack) 2010  . Urgency of micturation   . Urinary frequency    . Urinary incontinence     PAST SURGICAL HISTORY:  Past Surgical History:  Procedure Laterality Date  . ABDOMINAL HYSTERECTOMY    . ABDOMINOPLASTY    . APPENDECTOMY    . BACK SURGERY     extensive spinal fusion  . carpel tunnel surgery    . CATARACT EXTRACTION     x 2  . CESAREAN SECTION    . CHOLECYSTECTOMY    . COLONOSCOPY  2010  . COLONOSCOPY WITH PROPOFOL N/A 07/11/2016   Procedure: COLONOSCOPY WITH PROPOFOL;  Surgeon: Lucilla Lame, MD;  Location: [facility] ENDOSCOPY;  Service: Endoscopy;  Laterality: N/A;  . COLONOSCOPY WITH PROPOFOL N/A 03/26/2018   Procedure: COLONOSCOPY WITH PROPOFOL;  Surgeon: Lucilla Lame, MD;  Location: [facility] ENDOSCOPY;  Service: Endoscopy;  Laterality: N/A;  . ESOPHAGOGASTRODUODENOSCOPY (EGD) WITH PROPOFOL N/A 03/26/2018   Procedure: ESOPHAGOGASTRODUODENOSCOPY (EGD) WITH PROPOFOL;  Surgeon: Lucilla Lame, MD;  Location: [facility] ENDOSCOPY;  Service: Endoscopy;  Laterality: N/A;  . EYE SURGERY    . HERNIA REPAIR    . SHOULDER SURGERY    . TOE SURGERY    . UPPER GI ENDOSCOPY    . WRIST FRACTURE SURGERY Left     SOCIAL HISTORY:  Social History   Tobacco Use  . Smoking status:  Never Smoker  . Smokeless tobacco: Never Used  Substance Use Topics  . Alcohol use: No    FAMILY HISTORY:  Family History  Problem Relation Age of Onset  . Hyperlipidemia Mother   . Arrhythmia Mother        WPW  . Hypertension Mother   . Rheum arthritis Mother   . Heart disease Mother   . Heart attack Father 99  . Hyperlipidemia Father   . Hypertension Father   . Stroke Father   . Diabetes Father   . Heart disease Father   . Coronary artery disease Father   . Diabetes Sister   . Hyperlipidemia Sister   . Hypertension Sister   . Depression Sister   . Depression Sister   . Diabetes Sister   . Hypertension Sister   . Hyperlipidemia Sister   . Kidney disease Paternal Grandmother     DRUG ALLERGIES:  Allergies  Allergen Reactions  . Morphine And Related Anaphylaxis   . Fentanyl Other (See Comments)    Unknown reaction.  . Reglan [Metoclopramide]     Other reaction(s): Unknown Elevated BP  . Simvastatin Other (See Comments)    Muscle pain  . Betadine [Povidone Iodine] Rash  . Iodine Rash    Other reaction(s): Unknown-not allergic to ct contrast-ars 04/01/18  . Tetracyclines & Related Rash    Other reaction(s): Unknown    REVIEW OF SYSTEMS:   CONSTITUTIONAL: No fever, fatigue or weakness.  EYES: No blurred or double vision.  EARS, NOSE, AND THROAT: No tinnitus or ear pain.  RESPIRATORY: No cough, shortness of breath, wheezing or hemoptysis.  CARDIOVASCULAR:+ chest pain, orthopnea, edema.  GASTROINTESTINAL: No nausea, vomiting, diarrhea or abdominal pain.  GENITOURINARY: No dysuria, hematuria.  ENDOCRINE: No polyuria, nocturia,  HEMATOLOGY: No anemia, easy bruising or bleeding SKIN: No rash or lesion. MUSCULOSKELETAL: No joint pain or arthritis.   NEUROLOGIC: No tingling, numbness, weakness. + hedache PSYCHIATRY: No anxiety or depression.   MEDICATIONS AT HOME:  Prior to Admission medications   Medication Sig Start Date End Date Taking? Authorizing Provider  amLODipine (NORVASC) 10 MG tablet Take 1 tablet by mouth daily 06/17/18  Yes Jerrol Banana., MD  atorvastatin (LIPITOR) 20 MG tablet Take 1 tablet (20 mg total) by mouth daily. 08/13/18  Yes Jerrol Banana., MD  cephALEXin (KEFLEX) 500 MG capsule Take 1 capsule (500 mg total) by mouth 2 (two) times daily. 09/20/18  Yes Lavonia Drafts, MD  cloNIDine (CATAPRES) 0.3 MG tablet Take 1 tablet (0.3 mg total) by mouth 2 (two) times daily. 01/01/18  Yes Jerrol Banana., MD  clopidogrel (PLAVIX) 75 MG tablet Take 1 tablet (75 mg total) by mouth daily. 10/10/17 10/10/18 Yes Dohmeier, Asencion Partridge, MD  enalapril (VASOTEC) 20 MG tablet Take 1 tablet (20 mg total) by mouth 2 (two) times daily. 02/04/18  Yes Jerrol Banana., MD  hydrALAZINE (APRESOLINE) 100 MG tablet Take 1 tablet (100  mg total) by mouth 2 (two) times daily. 01/12/18  Yes Jerrol Banana., MD  insulin degludec (TRESIBA FLEXTOUCH) 100 UNIT/ML SOPN FlexTouch Pen Up to 30 units daily. Patient taking differently: Inject 20 Units into the skin 2 (two) times daily.  07/29/18  Yes Jerrol Banana., MD  levETIRAcetam (KEPPRA) 500 MG tablet Take 1 tablet (500 mg total) by mouth 2 (two) times daily. 500 mg bid. 04/30/18  Yes Dohmeier, Asencion Partridge, MD  metFORMIN (GLUCOPHAGE) 1000 MG tablet Take 1 tablet (1,000 mg total) by  mouth 2 (two) times daily. 07/22/18  Yes Jerrol Banana., MD  metoprolol succinate (TOPROL-XL) 100 MG 24 hr tablet Take 1 tablet (100 mg total) by mouth daily. 07/31/17  Yes Jerrol Banana., MD  nitroGLYCERIN (NITROSTAT) 0.4 MG SL tablet Place 1 tablet (0.4 mg total) under the tongue every 5 (five) minutes as needed for chest pain. 10/11/15  Yes Jerrol Banana., MD  oxybutynin (DITROPAN) 5 MG tablet Take 1 tablet (5 mg total) by mouth 2 (two) times daily. 07/31/17  Yes Jerrol Banana., MD  pantoprazole (PROTONIX) 40 MG tablet Take 1 tablet (40 mg total) by mouth daily. 08/13/18  Yes Jerrol Banana., MD  ACCU-CHEK SOFTCLIX LANCETS lancets CHECK BLOOD SUGAR THREE TIMES DAILY 04/24/16   Jerrol Banana., MD  Blood Glucose Monitoring Suppl (ACCU-CHEK AVIVA PLUS) w/Device KIT Check sugar three times daily DX E11.9-needs a meter 11/23/15   Jerrol Banana., MD  butalbital-acetaminophen-caffeine (FIORICET, ESGIC) [phone] MG tablet Take 1 tablet by mouth every 6 (six) hours as needed for headache. 09/21/18 09/21/19  Gregor Hams, MD  Continuous Blood Gluc Receiver (FREESTYLE LIBRE 14 DAY READER) DEVI LENGTH OF NEED: LIFETIME - UNLESS SPECIFIED OTHERWISE 06/27/18   Jerrol Banana., MD  Continuous Blood Gluc Sensor (FREESTYLE LIBRE 14 DAY SENSOR) MISC LENGTH OF NEED: LIFETIME - UNLESS SPECIFIED OTHERWISE 06/27/18   Jerrol Banana., MD  fluconazole (DIFLUCAN)  150 MG tablet Take 1 tablet (150 mg total) by mouth daily. Patient not taking: Reported on 09/25/2018 07/30/18   Jerrol Banana., MD  glucose blood (ACCU-CHEK AVIVA PLUS) test strip TEST BLOOD SUGAR THREE TIMES DAILY 12/05/17   Jerrol Banana., MD  HYDROcodone-acetaminophen [facility]) 10-325 MG tablet Take 1-2 tablets by mouth every 6 (six) hours as needed. Patient not taking: Reported on 09/25/2018 09/05/18   Jerrol Banana., MD  Lancet Devices [facility]) lancets Check sugar three times daily DX E11.9 11/23/15   Jerrol Banana., MD  mupirocin cream (BACTROBAN) 2 % Apply 1 application topically daily.    [provider]      PHYSICAL EXAMINATION:   VITAL SIGNS: Blood pressure (!) 161/77, pulse 76, temperature 97.8 F (36.6 C), resp. rate (!) 23, height 5' 1"  (1.549 m), weight 110 kg, SpO2 100 %.  GENERAL:  63 y.o.-year-old patient lying in the bed with no acute distress.  EYES: Pupils equal, round, reactive to light and accommodation. No scleral icterus. Extraocular muscles intact.  HEENT: Head atraumatic, normocephalic. Oropharynx and nasopharynx clear.  NECK:  Supple, no jugular venous distention. No thyroid enlargement, no tenderness.  LUNGS: Normal breath sounds bilaterally, no wheezing, rales,rhonchi or crepitation. No use of accessory muscles of respiration.  CARDIOVASCULAR: S1, S2 normal. No murmurs, rubs, or gallops.  ABDOMEN: Soft, nontender, nondistended. Bowel sounds present. No organomegaly or mass.  EXTREMITIES: No pedal edema, cyanosis, or clubbing.  NEUROLOGIC: Cranial nerves II through XII are intact. Muscle strength 5/5 in all extremities. Sensation intact. Gait not checked.  PSYCHIATRIC: The patient is alert and oriented x 3.  SKIN: No obvious rash, lesion, or ulcer.   LABORATORY PANEL:   CBC Recent Labs  Lab 09/25/18 0525  WBC 7.4  HGB 12.9  HCT 39.2  PLT 280  MCV 86.3  MCH 28.4  MCHC 32.9  RDW 13.7    ------------------------------------------------------------------------------------------------------------------  Chemistries  Recent Labs  Lab 09/25/18 0525  NA 140  K 3.3*  CL 106  CO2 20*  GLUCOSE 274*  BUN 17  CREATININE 1.19*  CALCIUM 9.5   ------------------------------------------------------------------------------------------------------------------ estimated creatinine clearance is 56.3 mL/min (A) (by C-G formula based on SCr of 1.19 mg/dL (H)). ------------------------------------------------------------------------------------------------------------------ No results for input(s): TSH, T4TOTAL, T3FREE, THYROIDAB in the last 72 hours.  Invalid input(s): FREET3   Coagulation profile No results for input(s): INR, PROTIME in the last 168 hours. ------------------------------------------------------------------------------------------------------------------- No results for input(s): DDIMER in the last 72 hours. -------------------------------------------------------------------------------------------------------------------  Cardiac Enzymes Recent Labs  Lab 09/25/18 0525  TROPONINI 0.04*   ------------------------------------------------------------------------------------------------------------------ Invalid input(s): POCBNP  ---------------------------------------------------------------------------------------------------------------  Urinalysis    Component Value Date/Time   COLORURINE YELLOW (A) 09/20/2018 0952   APPEARANCEUR CLOUDY (A) 09/20/2018 0952   APPEARANCEUR Turbid (A) 05/25/2016 1122   LABSPEC 1.012 09/20/2018 0952   LABSPEC 1.014 01/17/2014 1708   PHURINE 6.0 09/20/2018 0952   GLUCOSEU 150 (A) 09/20/2018 0952   GLUCOSEU >=500 01/17/2014 1708   HGBUR SMALL (A) 09/20/2018 0952   BILIRUBINUR NEGATIVE 09/20/2018 0952   BILIRUBINUR small 08/23/2018 1505   BILIRUBINUR Negative 05/25/2016 1122   BILIRUBINUR Negative 01/17/2014 1708    KETONESUR NEGATIVE 09/20/2018 0952   PROTEINUR 100 (A) 09/20/2018 0952   UROBILINOGEN 0.2 08/23/2018 1505   UROBILINOGEN 0.2 01/15/2009 0431   NITRITE POSITIVE (A) 09/20/2018 0952   LEUKOCYTESUR LARGE (A) 09/20/2018 0952   LEUKOCYTESUR 2+ 01/17/2014 1708     RADIOLOGY: Dg Chest 2 View  Result Date: 09/25/2018 CLINICAL DATA:  63 y/o  F; chest pain. EXAM: CHEST - 2 VIEW COMPARISON:  06/26/2018 chest radiograph FINDINGS: Normal cardiac silhouette. Aortic atherosclerosis with calcification. Clear lungs. No pleural effusion or pneumothorax. No acute osseous abnormality is evident. Right upper quadrant cholecystectomy clips. Chronic fracture deformity of the right mid and distal clavicle. IMPRESSION: No acute pulmonary process identified. Electronically Signed   By: Kristine Garbe M.D.   On: 09/25/2018 05:25   Ct Head Wo Contrast  Result Date: 09/25/2018 CLINICAL DATA:  Acute headache with normal neuro exam EXAM: CT HEAD WITHOUT CONTRAST TECHNIQUE: Contiguous axial images were obtained from the base of the skull through the vertex without intravenous contrast. COMPARISON:  09/21/2018 FINDINGS: Brain: Large remote left basal ganglia infarct, lateral lenticulostriate distribution. Cerebral volume loss with ventriculomegaly. No acute infarct, hemorrhage, hydrocephalus, collection, or masslike finding Vascular: Atherosclerotic calcification without hyperdense vessel Skull: No acute finding. Sinuses/Orbits: Bilateral cataract resection. IMPRESSION: 1. No acute finding or change from recent prior. 2. Remote left basal ganglia infarction. Electronically Signed   By: Monte Fantasia M.D.   On: 09/25/2018 05:17    EKG: Orders placed or performed during the hospital encounter of 09/25/18  . ED EKG  . ED EKG  . Repeat EKG  . Repeat EKG    IMPRESSION AND PLAN: Pt is 63 y/o with multiple medical problems presents with chest pain and headache.   1. Chest pain with abnormal trop- pt follows up  with cardiology as out patient we will consult them now. Trend trop. ASA, nitroglycerine prn  2. HA likely migraine: Treat symtomatic, due to persistance symtoms I ask per neurology  3. DM- place on SSI, Continue oral medication.continue insulin  4. H/o CVA asa, plavix  5. hyperlipdemia continue lipitor  6. Accelerated htn- start hydralazine, continue vasotec, norvasc  7. H/o sz continue keppra  8. Misc: lovenox  For dvt prop   All the records are reviewed and case discussed with ED provider. Management plans discussed with the patient, family and they are  in agreement.  CODE STATUS: Code Status History    Date Active Date Inactive Code Status Order ID Comments User Context   08/28/2018 2226 08/30/2018 1657 Full Code [id number]  [name], MD Inpatient   03/31/2018 2006 04/02/2018 1733 DNR [id number]  Sela Hua, MD Inpatient   10/03/2017 2208 10/04/2017 1718 Full Code [id number]  Dustin Flock, MD Inpatient   10/15/2016 2201 10/17/2016 1624 Full Code [id number]  Vaughan Basta, MD ED   07/08/2016 0437 07/11/2016 1610 Full Code [id number]  Saundra Shelling, MD ED   12/26/2015 1847 12/28/2015 2027 Full Code [id number]  Idelle Crouch, MD Inpatient   07/22/2015 0735 07/23/2015 1505 Full Code [id number]  Saundra Shelling, MD Inpatient    Advance Directive Documentation     Most Recent Value  Type of Advance Directive  Out of facility DNR (pink MOST or yellow form), Living will  Pre-existing out of facility DNR order (yellow form or pink MOST form)  -  "MOST" Form in Place?  -       TOTAL TIME TAKING CARE OF THIS PATIENT: 55 minutes.    Dustin Flock M.D on 09/25/2018 at 7:44 AM  Between 7am to 6pm - Pager - [phone]  After 6pm go to [email or web address] - password [email or web address] [facility]  [phone]  CC: Primary care physician; Jerrol Banana., MD

## 2018-09-25 NOTE — Consult Note (Signed)
Cardiology Consult    Patient ID: Susan House MRN: [id number], DOB/AGE: 63-Feb-1957   Admit date: 09/25/2018 Date of Consult: 09/25/2018  Primary Physician: Jerrol Banana., MD Primary Cardiologist: Ida Rogue, MD Requesting Provider: Chauncey Cruel. Posey Pronto, MD  Patient Profile    Susan House is a 63 y.o. female with a history of stroke w/ chronic left hand wkns, prior TIAs, DMII, HL, cerebrovascular and carotid arterial dzs, atypical c/p, diast dysfxn, chronic dizziness, obesity, and possible seizure, who is being seen today for the evaluation of chest pain at the request of Dr. Posey Pronto.  Past Medical History   Past Medical History:  Diagnosis Date   Allergy    Arthritis    Atypical chest pain    a. Reportedly nl cath in the past; b. 12/2009 MV: No ischemia/infarct. EF 87%.   Carotid arterial disease ([place])    a. 10/979 CTA head/neck: LICA 19J, RICA 47-->WGN rx.   Cellulitis and abscess of face    Chronic Dizziness    Coronary artery calcification seen on CT scan    a. 10/2016 CTA chest: Atherosclerotic calcifications Ao, cors, and prox great vessels.   Depression    Diabetes mellitus ([place])    Diastolic dysfunction    a. 2008 Echo: Nl EF; b. 2011 Echo: Nl EF; c. 06/2016 Echo: EF 55-60%, Gr1DD; d. 03/2018 Echo: EF 55-60%, no rwma, Gr1DD, mod dil LA.   Edema    GERD (gastroesophageal reflux disease)    Headache    Hematuria    Hyperlipidemia    Hypertension    IBS (irritable bowel syndrome)    Migraine    Morbid obesity ([place])    Nocturia    Possible Seizure ([place])    a. 10/2016 ? sz. MRI neg for stroke.   Stroke [facility]) 2000   TIA (transient ischemic attack) 2010 & 2019   a. 2010; b. 03/2018 Left sided wkns-->MRI neg for stroke. CTA head/neck: Right A2 cut off/occlusion w/ collats extending over R cerebral convexity. LICA 56O, RICA 45, RSubclavian 50->med rx.   Urgency of micturation    Urinary frequency    Urinary incontinence     Past  Surgical History:  Procedure Laterality Date   ABDOMINAL HYSTERECTOMY     ABDOMINOPLASTY     APPENDECTOMY     BACK SURGERY     extensive spinal fusion   carpel tunnel surgery     CATARACT EXTRACTION     x 2   CESAREAN SECTION     CHOLECYSTECTOMY     COLONOSCOPY  2010   COLONOSCOPY WITH PROPOFOL N/A 07/11/2016   Procedure: COLONOSCOPY WITH PROPOFOL;  Surgeon: Lucilla Lame, MD;  Location: [facility] ENDOSCOPY;  Service: Endoscopy;  Laterality: N/A;   COLONOSCOPY WITH PROPOFOL N/A 03/26/2018   Procedure: COLONOSCOPY WITH PROPOFOL;  Surgeon: Lucilla Lame, MD;  Location[facility] ENDOSCOPY;  Service: Endoscopy;  Laterality: N/A;   ESOPHAGOGASTRODUODENOSCOPY (EGD) WITH PROPOFOL N/A 03/26/2018   Procedure: ESOPHAGOGASTRODUODENOSCOPY (EGD) WITH PROPOFOL;  Surgeon: Lucilla Lame, MD;  Location: [facility] ENDOSCOPY;  Service: Endoscopy;  Laterality: N/A;   EYE SURGERY     HERNIA REPAIR     SHOULDER SURGERY     TOE SURGERY     UPPER GI ENDOSCOPY     WRIST FRACTURE SURGERY Left      Allergies  Allergies  Allergen Reactions   Morphine And Related Anaphylaxis   Fentanyl Other (See Comments)    Unknown reaction.   Reglan [Metoclopramide]  Other reaction(s): Unknown Elevated BP   Simvastatin Other (See Comments)    Muscle pain   Betadine [Povidone Iodine] Rash   Iodine Rash    Other reaction(s): Unknown-not allergic to ct contrast-ars 04/01/18   Tetracyclines & Related Rash    Other reaction(s): Unknown    History of Present Illness    63 y.o. female with a history of stroke (2000) w/ chronic left hand wkns, prior TIAs (2010, 03/2018), DMII, HL, cerebrovascular and carotid arterial dzs, atypical c/p, diast dysfxn, chronic dizziness, obesity, and possible seizure.  She reports a h/o chest pain dating back into the '90's and says that she previously underwent diagnostic catheterization @ that time.  She had a neg MV in 2011 and has since had inpt evaluation for atypical c/p  2017.  At that time, trop was nl and LV fxn remained nl.  She has seen Dr. Rockey Situ in clinic in the past, but not since 2018.  She lives @ home locally with her husband and is relatively sedentary.  She has some degree of chronic DOE and uses a walker or cane to ambulate, but if she needed to go longer distances, her husband pushes her in a w/c (has been this way for a few yrs).  In Sept 2019, she was admitted to [facility] w/ left sided wkns.  MRI was neg for stroke however, CTA of the head and neck showed R A2 cut off/occlusion w/ prominent collaterals and moderate bilat carotid dzs.  She was seen by vascular surgery with rec for continued med rx including asa/plavix.    Since her hospitalization in 03/2018, she says her activity level has declined further.  She denies symptoms, and simply says that she is less likely to get up and walk around.  On 3/6, she was walking in her home and holding a chux pad, which she had planned to place on a chair prior to sitting.  Unfortunately, she dropped the pad and then her feet were cut up in it, causing her to stumble and fall forward, striking her left face on the floor.  She was seen in the ED and head CT was neg for acute fracture/abnormality.  She was subsequently d/c'd but returned on 3/7 due to headache.  Repeat head CT remained negative and she was again discharged home.  Since then, she has continued to have headaches, which are predominantly frontal but do extend over her crown to the posterior head.  She has been using tylenol w/ codeine w/ only modest relief.  This AM, she awoke @ ~ 3AM w/ ongoing headache and she also noted 6/10 sscp w/o assoc symptoms or radiation.  She awoke her husband who then gave her a sl ntg from his bottle w/o relief.  Husband then drove her to the ED for further eval.  Here, ECG reportedly w/o acute ST/T changes (no ecg available for review @ this time). Initial trop was mildly elevated @ 0.04 w/ f/u  @ 7:52 am lower @ 0.03.  C/p finally  resolved around 8:30 or 9am, ~ 5.5 to 6 hrs after onset.  She is currently c/p free.  Prior to this AM, she denies any prior h/o chest pain.  Inpatient Medications     amLODipine  10 mg Oral Daily   aspirin  81 mg Oral Daily   atorvastatin  20 mg Oral Daily   cloNIDine  0.3 mg Oral BID   clopidogrel  75 mg Oral Daily   enalapril  20 mg Oral  BID   enoxaparin (LOVENOX) injection  40 mg Subcutaneous Q12H   hydrALAZINE  100 mg Oral Q8H   insulin aspart  0-9 Units Subcutaneous TID WC   insulin glargine  20 Units Subcutaneous BID   levETIRAcetam  500 mg Oral BID   metFORMIN  1,000 mg Oral BID   metoprolol succinate  100 mg Oral Daily   oxybutynin  5 mg Oral BID   pantoprazole  40 mg Oral Daily   sodium chloride flush  3 mL Intravenous Once   sodium chloride flush  3 mL Intravenous Q12H   Family History    Family History  Problem Relation Age of Onset   Hyperlipidemia Mother    Arrhythmia Mother        WPW   Hypertension Mother    Rheum arthritis Mother    Heart disease Mother    Heart attack Father 89   Hyperlipidemia Father    Hypertension Father    Stroke Father    Diabetes Father    Heart disease Father    Coronary artery disease Father    Diabetes Sister    Hyperlipidemia Sister    Hypertension Sister    Depression Sister    Depression Sister    Diabetes Sister    Hypertension Sister    Hyperlipidemia Sister    Kidney disease Paternal Grandmother    She indicated that her mother is deceased. She indicated that her father is deceased. She indicated that both of her sisters are alive. She indicated that the status of her paternal grandmother is unknown. She indicated that her daughter is alive.  Social History    Social History   Socioeconomic History   Marital status: Married    Spouse name: Marcello Moores    Number of children: 2   Years of education: 15   Highest education level: Not on file  Occupational History    Occupation: Disabled    Occupation: retired  Scientist, product/process development strain: Somewhat hard   Food insecurity:    Worry: Patient refused    Inability: Patient refused   Transportation needs:    Medical: No    Non-medical: No  Tobacco Use   Smoking status: Never Smoker   Smokeless tobacco: Never Used  Substance and Sexual Activity   Alcohol use: No   Drug use: No   Sexual activity: Never  Lifestyle   Physical activity:    Days per week: Patient refused    Minutes per session: Patient refused   Stress: Only a little  Relationships   Social connections:    Talks on phone: More than three times a week    Gets together: More than three times a week    Attends religious service: More than 4 times per year    Active member of club or organization: Patient refused    Attends meetings of clubs or organizations: 1 to 4 times per year    Relationship status: Married   Intimate partner violence:    Fear of current or ex partner: No    Emotionally abused: No    Physically abused: No    Forced sexual activity: No  Other Topics Concern   Not on file  Social History Narrative   Patient lives in [place] at home with husband Marcello Moores.    Patient has 2 children and 2 step children.    Patient has 12+ years of education.    Patient is Disabled since stroke in 2000.  Review of Systems    General:  No chills, fever, night sweats or weight changes.  Cardiovascular:  +++ chest pain, +++ chronic mild dyspnea on exertion, +++ mild edema, no orthopnea, palpitations, paroxysmal nocturnal dyspnea. Dermatological: No rash, lesions/masses Respiratory: No cough, +++ dyspnea Urologic: No hematuria, dysuria Abdominal:   +++ nausea following arrival in the ED, no vomiting, diarrhea, bright red blood per rectum, melena, or hematemesis Neurologic:  No visual changes, +++ chronic left hand wkns, no changes in mental status. All other systems reviewed and are otherwise  negative except as noted above.  Physical Exam    Blood pressure (!) 175/74, pulse 78, temperature 97.8 F (36.6 C), resp. rate 15, height 5\' 1"  (1.549 m), weight 110 kg, SpO2 95 %.  General: Pleasant, NAD Psych: Normal affect. Neuro: Alert and oriented X 3. Left hand wkns. Some word searching/aphasia noted. HEENT: Normal  Neck: Supple without bruits or JVD. Lungs:  Resp regular and unlabored, diminished breath sounds @ bilat bases. Heart: RRR no s3, s4, or murmurs. Abdomen: Soft, non-tender, non-distended, BS + x 4.  Extremities: No clubbing, cyanosis or edema. DP/PT/Radials 2+ and equal bilaterally.  Labs     Recent Labs    09/25/18 0525 09/25/18 0752  TROPONINI 0.04* 0.03*   Lab Results  Component Value Date   WBC 7.4 09/25/2018   HGB 12.9 09/25/2018   HCT 39.2 09/25/2018   MCV 86.3 09/25/2018   PLT 280 09/25/2018    Recent Labs  Lab 09/25/18 0525  NA 140  K 3.3*  CL 106  CO2 20*  BUN 17  CREATININE 1.19*  CALCIUM 9.5  GLUCOSE 274*   Lab Results  Component Value Date   CHOL 102 04/01/2018   HDL 29 (L) 04/01/2018   LDLCALC 33 04/01/2018   TRIG 198 (H) 04/01/2018    Radiology Studies    Dg Chest 2 View  Result Date: 09/25/2018 CLINICAL DATA:  63 y/o  F; chest pain. EXAM: CHEST - 2 VIEW COMPARISON:  06/26/2018 chest radiograph FINDINGS: Normal cardiac silhouette. Aortic atherosclerosis with calcification. Clear lungs. No pleural effusion or pneumothorax. No acute osseous abnormality is evident. Right upper quadrant cholecystectomy clips. Chronic fracture deformity of the right mid and distal clavicle. IMPRESSION: No acute pulmonary process identified. Electronically Signed   By: Kristine Garbe M.D.   On: 09/25/2018 05:25   Dg Knee 2 Views Left  Result Date: 09/20/2018 CLINICAL DATA:  Pt reports that she fell earlier today and landed on her left knee. Pt states she only hit the floor on the way down. Complaining of left knee pain at this time.  Anterior bruising noted EXAM: LEFT KNEE - 1-2 VIEW COMPARISON:  None. FINDINGS: No fracture, bone lesion or dislocation. Mild narrowing of the medial joint space compartment. Calcifications are noted along the menisci. No joint effusion. Soft tissues are unremarkable. Skeletal structures are diffusely demineralized. IMPRESSION: 1. No fracture or acute finding. 2. Mile arthropathic changes with medial joint space compartment narrowing. No joint effusion. Electronically Signed   By: Lajean Manes M.D.   On: 09/20/2018 09:56   Ct Head Wo Contrast  Result Date: 09/25/2018 CLINICAL DATA:  Acute headache with normal neuro exam EXAM: CT HEAD WITHOUT CONTRAST TECHNIQUE: Contiguous axial images were obtained from the base of the skull through the vertex without intravenous contrast. COMPARISON:  09/21/2018 FINDINGS: Brain: Large remote left basal ganglia infarct, lateral lenticulostriate distribution. Cerebral volume loss with ventriculomegaly. No acute infarct, hemorrhage, hydrocephalus, collection, or masslike finding  Vascular: Atherosclerotic calcification without hyperdense vessel Skull: No acute finding. Sinuses/Orbits: Bilateral cataract resection. IMPRESSION: 1. No acute finding or change from recent prior. 2. Remote left basal ganglia infarction. Electronically Signed   By: Monte Fantasia M.D.   On: 09/25/2018 05:17   Ct Head Wo Contrast  Result Date: 09/21/2018 CLINICAL DATA:  Headaches and visual loss for a few hours, history of recent fall with negative head CT, subsequent encounter EXAM: CT HEAD WITHOUT CONTRAST TECHNIQUE: Contiguous axial images were obtained from the base of the skull through the vertex without intravenous contrast. COMPARISON:  CT from the previous day FINDINGS: Brain: Diffuse atrophic changes are noted. Chronic white matter ischemic change is seen. Findings of prior infarct on the left with ex vacuo dilatation of the left lateral ventricle are again seen. No findings to suggest acute  hemorrhage, acute infarction or space-occupying mass lesion. Vascular: No hyperdense vessel or unexpected calcification. Skull: Normal. Negative for fracture or focal lesion. Sinuses/Orbits: No acute finding. Other: None. IMPRESSION: Chronic atrophic and ischemic changes without acute abnormality. No significant change from the previous day. Electronically Signed   By: Inez Catalina M.D.   On: 09/21/2018 04:02   Ct Head Wo Contrast  Result Date: 09/20/2018 CLINICAL DATA:  Blunt trauma to the face. EXAM: CT HEAD WITHOUT CONTRAST CT MAXILLOFACIAL WITHOUT CONTRAST TECHNIQUE: Multidetector CT imaging of the head and maxillofacial structures were performed using the standard protocol without intravenous contrast. Multiplanar CT image reconstructions of the maxillofacial structures were also generated. COMPARISON:  Head CT, 03/31/2018 FINDINGS: CT HEAD FINDINGS Brain: No evidence of acute infarction, hemorrhage, hydrocephalus, extra-axial collection or mass lesion/mass effect. Old left MCA distribution infarct associated with left lateral ventricle ex vacuo dilation is stable. Patchy white matter hypoattenuation is also noted consistent with mild chronic microvascular ischemic change. There is generalized ventricular and sulcal enlargement reflecting mild diffuse atrophy. Vascular: No hyperdense vessel or unexpected calcification. Skull: Normal. Negative for fracture or focal lesion. Other: None. CT MAXILLOFACIAL FINDINGS Osseous: No acute fracture or bone lesion. Normally aligned temporal mandibular joints. Orbits: Negative. No traumatic or inflammatory finding. Sinuses: Clear sinuses, middle ear cavities and mastoid air cells. Soft tissues: Left cheek soft tissue hematoma with edema extending to the left lateral periorbital soft tissues. No masses or adenopathy. Carotid artery vascular calcifications. IMPRESSION: HEAD CT 1. No acute intracranial abnormalities. 2. Old left MCA distribution infarct, atrophy and mild  chronic microvascular ischemic change. MAXILLOFACIAL CT 1. Left maxillary soft tissue contusion/hematoma. 2. No fractures. 3. No acute orbital abnormality.  Clear sinuses. Electronically Signed   By: Lajean Manes M.D.   On: 09/20/2018 10:14   Ct Cervical Spine Wo Contrast  Result Date: 09/25/2018 CLINICAL DATA:  Neck pain. EXAM: CT CERVICAL SPINE WITHOUT CONTRAST TECHNIQUE: Multidetector CT imaging of the cervical spine was performed without intravenous contrast. Multiplanar CT image reconstructions were also generated. COMPARISON:  CT angiogram of the neck dated 04/01/2018 FINDINGS: Alignment: There is straightening of the cervical lordosis. 1.5 mm retrolisthesis of C6 on C7. Skull base and vertebrae: No fracture or bone destruction. No significant facet arthritis. Soft tissues and spinal canal: No prevertebral fluid or swelling. No visible canal hematoma. Disc levels:  C2-3: Negative. C3-4: Negative. C4-5: Tiny partially calcified disc bulge in the midline with no neural impingement. C5-6: Minimal disc space narrowing. Small broad-based disc bulge with no neural impingement. Widely patent neural foramina. C6-7: Disc space narrowing. Small broad-based disc bulge with accompanying osteophytes without neural impingement. Widely patent neural foramina.  C7-T1: Focal calcification in the left ligamentum flavum no consequence. No disc bulging or protrusion. No foraminal stenosis. Upper chest: Aortic atherosclerosis.  Lung apices are normal. Other: Prominent calcification in the carotid bifurcations bilaterally. Calcification in the carotid siphons and in the distal vertebral arteries. IMPRESSION: 1. No acute abnormality of the cervical spine. 2. 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 3. Atherosclerotic changes in the carotid and vertebral arteries and in the arch of the aorta. Electronically Signed   By: Lorriane Shire M.D.   On: 09/25/2018 11:45   Mr Brain Wo  Contrast  Result Date: 09/25/2018 CLINICAL DATA:  Acute presentation with headache. EXAM: MRI HEAD WITHOUT CONTRAST TECHNIQUE: Multiplanar, multiecho pulse sequences of the brain and surrounding structures were obtained without intravenous contrast. COMPARISON:  Head CT same day.  MRI 04/01/2018 FINDINGS: Brain: Diffusion imaging does not show any acute or subacute infarction. Chronic small-vessel ischemic changes and wall air in degeneration affect the pons. No focal cerebellar insult. Cerebral hemispheres show old infarction in the right radiating white matter tracts and extensive old infarction in the left basal ganglia and radiating white matter tracts. Chronic small-vessel ischemic changes affect the deep white matter. No mass lesion, acute hemorrhage, hydrocephalus or extra-axial collection. Hemosiderin deposition is present in the old infarction regions. Ex vacuo enlargement of the ventricles without suspicion of obstructive hydrocephalus. Vascular: Major vessels at the base of the brain show flow. Skull and upper cervical spine: Negative Sinuses/Orbits: Clear/normal Other: None IMPRESSION: No change seen since September of last year. Old infarctions at the base of the brain bilaterally, more extensive on the left than the right. Chronic small-vessel ischemic changes. No evidence of acute or subacute infarction. Electronically Signed   By: Nelson Chimes M.D.   On: 09/25/2018 12:16   Ct Maxillofacial Wo Contrast  Result Date: 09/20/2018 CLINICAL DATA:  Blunt trauma to the face. EXAM: CT HEAD WITHOUT CONTRAST CT MAXILLOFACIAL WITHOUT CONTRAST TECHNIQUE: Multidetector CT imaging of the head and maxillofacial structures were performed using the standard protocol without intravenous contrast. Multiplanar CT image reconstructions of the maxillofacial structures were also generated. COMPARISON:  Head CT, 03/31/2018 FINDINGS: CT HEAD FINDINGS Brain: No evidence of acute infarction, hemorrhage, hydrocephalus,  extra-axial collection or mass lesion/mass effect. Old left MCA distribution infarct associated with left lateral ventricle ex vacuo dilation is stable. Patchy white matter hypoattenuation is also noted consistent with mild chronic microvascular ischemic change. There is generalized ventricular and sulcal enlargement reflecting mild diffuse atrophy. Vascular: No hyperdense vessel or unexpected calcification. Skull: Normal. Negative for fracture or focal lesion. Other: None. CT MAXILLOFACIAL FINDINGS Osseous: No acute fracture or bone lesion. Normally aligned temporal mandibular joints. Orbits: Negative. No traumatic or inflammatory finding. Sinuses: Clear sinuses, middle ear cavities and mastoid air cells. Soft tissues: Left cheek soft tissue hematoma with edema extending to the left lateral periorbital soft tissues. No masses or adenopathy. Carotid artery vascular calcifications. IMPRESSION: HEAD CT 1. No acute intracranial abnormalities. 2. Old left MCA distribution infarct, atrophy and mild chronic microvascular ischemic change. MAXILLOFACIAL CT 1. Left maxillary soft tissue contusion/hematoma. 2. No fractures. 3. No acute orbital abnormality.  Clear sinuses. Electronically Signed   By: Lajean Manes M.D.   On: 09/20/2018 10:14    ECG & Cardiac Imaging    pending - personally reviewed.  Assessment & Plan    1.  Chest pain/CAD/demand ischemia:  Pt w/ prior h/o atypical c/p and neg MV in 2011 with subsequent echos showing  nl EF w/o rwma (most recent 03/2018).  She awoke this AM w/ a headache in the setting of recent fall and facial trauma and also noted c/p.  C/p lasted 5-6 hrs w/o associated symptoms and trop was initially 0.04 and has since come down to 0.03.  ECG pending (not available for review when I saw her).  She is currently c/p free.  She does have evidence of coronary atherosclerosis on prior chest CT and also has known carotid and cerebrovascular dzs.  In that setting, I suspect troponin  elevation represents demand ischemia as opposed to ACS.  Agree w/ admission, asa,  blocker, acei, plavix, statin.  I will arrange for a lexiscan MV in the AM to r/o ischemia.  The benefits (risk stratification, diagnosing coronary artery disease, treatment guidance) and risks [chest pain, dyspnea, cardiac arrhythmias, dizziness, low blood pressure, allergic reaction, heart attack, and life-threatening complications (estimated to be 1 in 10,000)] were discussed in detail with Susan House and she agrees to proceed.  2.  Headache/Facial trauma:  S/p fall on 3/6 - mechanical - tripped over a chux pad.  Multiple head CT's since then and now also an MRI.  Left maxillary soft tissue contusion/hematoma noted on CT on 3/6, otw no acute findings.  Pain mgmt per IM.  3.  Essential HTN:  BP currently elevated in ED.  She did not take AM meds.  Follow bp's and adjust meds as necessary during admission.  4.  HL:  LDL 33 in 03/2018.  Cont statin rx.  5.  Carotid/cerebrovascular dzs w/ h/o TIA/Stroke:  No new Ss.  Cont asa, plavix, statin rx.  6.  Diastolic dysfunction:  Euvolemic.  Address BP as above.  7.  DMII:  A1c 10.5 in Feb.  Insulin therapy per IM.  Needs close outpt f/u.  8.  Hypokalemia:  Supplementation ordered.  F/u in AM and check Mg.   Signed, Murray Hodgkins, NP 09/25/2018, 12:27 PM  For questions or updates, please contact   Please consult [email or web address] for contact info under Cardiology/STEMI.

## 2018-09-25 NOTE — Plan of Care (Signed)
  Problem: Safety: Goal: Ability to remain free from injury will improve Outcome: Progressing   Problem: Skin Integrity: Goal: Risk for impaired skin integrity will decrease Outcome: Progressing   Problem: Clinical Measurements: Goal: Ability to maintain clinical measurements within normal limits will improve Outcome: Progressing

## 2018-09-25 NOTE — ED Notes (Signed)
ED Provider at bedside. 

## 2018-09-25 NOTE — Progress Notes (Signed)
Advanced care plan.  Purpose of the Encounter: CODE STATUS  Parties in Attendance:  Patient's Decision Capacity:  Subjective/Patient's story: Susan House  is a 63 y.o. female with a known history of osteoarthritis, diabetes type 2, GERD, hypertension, hyperlipidemia, previous history of CVA presenting to the hospital with complaint of chest pain and headache.  Patient states that she fell 2 weeks ago approximately and tripped.  She developed significant bruising on the left side of her face.  She does have a history of migraine but since the fall she has had episodes of headaches.  Patient actually was seen in the emergency room on March 6 and discharged home.  Now returns back with headache.     Objective/Medical story I discussed with patient regarding her desires for cardiac and pulmonary rescussitation.  Pt states she wants to be DNR. She also has living will instructing her wishes.     Goals of care determination:  DNR   CODE STATUS:  DNR  Time spent discussing advanced care planning: 16 minutes

## 2018-09-25 NOTE — Progress Notes (Signed)
Discussed with ordering provider for [facility], Sherrilyn Rist,  that it is recommended for an infusion of no more than 20 mg/min per [facility]rom[facility]. Provider confirmed that she wanted the medication given over 15 minutes for a headache.   Kristeen Miss, PharmD

## 2018-09-25 NOTE — ED Notes (Signed)
Pt c/o CP and Headache 7/10

## 2018-09-26 ENCOUNTER — Other Ambulatory Visit: Payer: Self-pay

## 2018-09-26 ENCOUNTER — Observation Stay (HOSPITAL_BASED_OUTPATIENT_CLINIC_OR_DEPARTMENT_OTHER): Payer: Medicare Other

## 2018-09-26 DIAGNOSIS — R7989 Other specified abnormal findings of blood chemistry: Secondary | ICD-10-CM | POA: Diagnosis not present

## 2018-09-26 DIAGNOSIS — E1159 Type 2 diabetes mellitus with other circulatory complications: Secondary | ICD-10-CM

## 2018-09-26 DIAGNOSIS — R079 Chest pain, unspecified: Secondary | ICD-10-CM | POA: Diagnosis not present

## 2018-09-26 DIAGNOSIS — E119 Type 2 diabetes mellitus without complications: Secondary | ICD-10-CM | POA: Diagnosis not present

## 2018-09-26 DIAGNOSIS — S060X9D Concussion with loss of consciousness of unspecified duration, subsequent encounter: Secondary | ICD-10-CM | POA: Diagnosis not present

## 2018-09-26 DIAGNOSIS — R51 Headache: Secondary | ICD-10-CM | POA: Diagnosis not present

## 2018-09-26 DIAGNOSIS — I10 Essential (primary) hypertension: Secondary | ICD-10-CM | POA: Diagnosis not present

## 2018-09-26 LAB — NM MYOCAR MULTI W/SPECT W/WALL MOTION / EF
CSEPHR: 57 %
Estimated workload: 1 METS
Exercise duration (min): 0 min
Exercise duration (sec): 0 s
LV dias vol: 45 mL (ref 46–106)
LVSYSVOL: 15 mL
MPHR: 158 {beats}/min
Peak HR: 91 {beats}/min
Rest HR: 63 {beats}/min
SDS: 2
SRS: 0
SSS: 0
TID: 1.15

## 2018-09-26 LAB — BASIC METABOLIC PANEL
Anion gap: 9 (ref 5–15)
BUN: 15 mg/dL (ref 8–23)
CO2: 22 mmol/L (ref 22–32)
Calcium: 8.6 mg/dL — ABNORMAL LOW (ref 8.9–10.3)
Chloride: 110 mmol/L (ref 98–111)
Creatinine, Ser: 1.15 mg/dL — ABNORMAL HIGH (ref 0.44–1.00)
GFR calc Af Amer: 59 mL/min — ABNORMAL LOW (ref >60)
GFR calc non Af Amer: 51 mL/min — ABNORMAL LOW (ref >60)
GLUCOSE: 115 mg/dL — AB (ref 70–99)
Potassium: 4.1 mmol/L (ref 3.5–5.1)
Sodium: 141 mmol/L (ref 135–145)

## 2018-09-26 LAB — MAGNESIUM: Magnesium: 2.1 mg/dL (ref 1.7–2.4)

## 2018-09-26 LAB — GLUCOSE, CAPILLARY
GLUCOSE-CAPILLARY: 113 mg/dL — AB (ref 70–99)
Glucose-Capillary: 129 mg/dL — ABNORMAL HIGH (ref 70–99)
Glucose-Capillary: 117 mg/dL — ABNORMAL HIGH (ref 70–99)

## 2018-09-26 MED ORDER — ASPIRIN 81 MG PO CHEW: 81.0000 mg | CHEWABLE_TABLET | Freq: Every day | ORAL | Status: AC

## 2018-09-26 MED ORDER — TECHNETIUM TC 99M TETROFOSMIN IV KIT
31.8200 | PACK | Freq: Once | INTRAVENOUS | Status: AC | PRN
  Administered 2018-09-26: 31.82 via INTRAVENOUS

## 2018-09-26 MED ORDER — DOXAZOSIN MESYLATE 1 MG PO TABS: 1.0000 mg | ORAL_TABLET | Freq: Every day | ORAL | Status: DC

## 2018-09-26 MED ORDER — TECHNETIUM TC 99M TETROFOSMIN IV KIT
10.3470 | PACK | Freq: Once | INTRAVENOUS | Status: AC | PRN
  Administered 2018-09-26: 10.347 via INTRAVENOUS

## 2018-09-26 MED ORDER — CARVEDILOL 6.25 MG PO TABS: 6.2500 mg | ORAL_TABLET | Freq: Two times a day (BID) | ORAL | Status: DC

## 2018-09-26 MED ORDER — TRAMADOL-ACETAMINOPHEN 37.5-325 MG PO TABS: 1.0000 | ORAL_TABLET | Freq: Four times a day (QID) | ORAL | 0 refills | Status: DC | PRN

## 2018-09-26 NOTE — Progress Notes (Signed)
Progress Note  Patient Name: Susan House Date of Encounter: 09/26/2018  Primary Cardiologist: Ida Rogue, MD  Subjective   Susan House is sitting up in the chair this morning, visibly more comfortable and alert able to respond readily to questions.  She reports feeling "much better" this morning.  She denies chest pain/dyspnea overnight, and was able to ambulate to the bathroom without difficulty.  She does admit to continued headache, but much improved at 4/10.  She has no complaints at this time, and says she feels fine.    Inpatient Medications    Scheduled Meds:  amLODipine  10 mg Oral Daily   aspirin  81 mg Oral Daily   atorvastatin  20 mg Oral Daily   cloNIDine  0.3 mg Oral BID   clopidogrel  75 mg Oral Daily   enalapril  20 mg Oral BID   enoxaparin (LOVENOX) injection  40 mg Subcutaneous Q12H   hydrALAZINE  100 mg Oral Q8H   insulin aspart  0-9 Units Subcutaneous TID WC   insulin glargine  20 Units Subcutaneous BID   levETIRAcetam  500 mg Oral BID   metFORMIN  1,000 mg Oral BID   metoprolol succinate  100 mg Oral Daily   oxybutynin  5 mg Oral BID   pantoprazole  40 mg Oral Daily   regadenoson  0.4 mg Intravenous Once   sodium chloride flush  3 mL Intravenous Once   sodium chloride flush  3 mL Intravenous Q12H   Continuous Infusions:  sodium chloride     valproate sodium     PRN Meds: sodium chloride, acetaminophen **OR** acetaminophen, butalbital-acetaminophen-caffeine, hydrALAZINE, nitroGLYCERIN, ondansetron **OR** ondansetron (ZOFRAN) IV, sodium chloride flush   Vital Signs    Vitals:   09/25/18 2024 09/25/18 2140 09/26/18 0319 09/26/18 0553  BP: (!) 140/52 (!) 150/52 (!) 158/56 (!) 170/54  Pulse: 63 (!) 59 64 60  Resp: 18  16   Temp: 98.5 F (36.9 C) 98.6 F (37 C) 98.6 F (37 C)   TempSrc: Oral Oral Oral   SpO2: 100% 99% 98%   Weight:      Height:        Intake/Output Summary (Last 24 hours) at 09/26/2018  0806 Last data filed at 09/26/2018 0100 Gross per 24 hour  Intake 1100 ml  Output 400 ml  Net 700 ml   Filed Weights   09/25/18 0453 09/25/18 1439  Weight: 110 kg 106.1 kg    Physical Exam   GEN: Obese, in no acute distress.  HEENT: + L sided facial bruising, impaired vision with glasses present. otherwise normal.  Neck: Supple, no JVD, carotid bruits, or masses. Cardiac: RRR, no murmurs, rubs, or gallops. No clubbing, cyanosis, edema.  Radials/PT 2+ and equal bilaterally.  Respiratory:  Respirations regular and unlabored, clear to auscultation bilaterally. GI: Soft, nontender, nondistended, BS + x 4. MS: +L hand contracture, no additional deformity or atrophy. Skin: warm and dry, no rash. Neuro:  +chronic L hand weakness, other strength and sensation are intact. Psych: AAOx3.  Normal affect.  Labs    Chemistry Recent Labs  Lab 09/25/18 0525 09/26/18 0522  NA 140 141  K 3.3* 4.1  CL 106 110  CO2 20* 22  GLUCOSE 274* 115*  BUN 17 15  CREATININE 1.19* 1.15*  CALCIUM 9.5 8.6*  GFRNONAA 49* 51*  GFRAA 57* 59*  ANIONGAP 14 9     Hematology Recent Labs  Lab 09/25/18 0525  WBC 7.4  RBC 4.54  HGB 12.9  HCT 39.2  MCV 86.3  MCH 28.4  MCHC 32.9  RDW 13.7  PLT 280    Cardiac Enzymes Recent Labs  Lab 09/25/18 0525 09/25/18 0752 09/25/18 1447  TROPONINI 0.04* 0.03* 0.03*      Radiology    Dg Chest 2 View  Result Date: 09/25/2018 CLINICAL DATA:  63 y/o  F; chest pain. EXAM: CHEST - 2 VIEW COMPARISON:  06/26/2018 chest radiograph FINDINGS: Normal cardiac silhouette. Aortic atherosclerosis with calcification. Clear lungs. No pleural effusion or pneumothorax. No acute osseous abnormality is evident. Right upper quadrant cholecystectomy clips. Chronic fracture deformity of the right mid and distal clavicle. IMPRESSION: No acute pulmonary process identified. Electronically Signed   By: Kristine Garbe M.D.   On: 09/25/2018 05:25   Ct Head Wo  Contrast  Result Date: 09/25/2018 CLINICAL DATA:  Acute headache with normal neuro exam EXAM: CT HEAD WITHOUT CONTRAST TECHNIQUE: Contiguous axial images were obtained from the base of the skull through the vertex without intravenous contrast. COMPARISON:  09/21/2018 FINDINGS: Brain: Large remote left basal ganglia infarct, lateral lenticulostriate distribution. Cerebral volume loss with ventriculomegaly. No acute infarct, hemorrhage, hydrocephalus, collection, or masslike finding Vascular: Atherosclerotic calcification without hyperdense vessel Skull: No acute finding. Sinuses/Orbits: Bilateral cataract resection. IMPRESSION: 1. No acute finding or change from recent prior. 2. Remote left basal ganglia infarction. Electronically Signed   By: Monte Fantasia M.D.   On: 09/25/2018 05:17   Ct Cervical Spine Wo Contrast  Result Date: 09/25/2018 CLINICAL DATA:  Neck pain. EXAM: CT CERVICAL SPINE WITHOUT CONTRAST TECHNIQUE: Multidetector CT imaging of the cervical spine was performed without intravenous contrast. Multiplanar CT image reconstructions were also generated. COMPARISON:  CT angiogram of the neck dated 04/01/2018 FINDINGS: Alignment: There is straightening of the cervical lordosis. 1.5 mm retrolisthesis of C6 on C7. Skull base and vertebrae: No fracture or bone destruction. No significant facet arthritis. Soft tissues and spinal canal: No prevertebral fluid or swelling. No visible canal hematoma. Disc levels:  C2-3: Negative. C3-4: Negative. C4-5: Tiny partially calcified disc bulge in the midline with no neural impingement. C5-6: Minimal disc space narrowing. Small broad-based disc bulge with no neural impingement. Widely patent neural foramina. C6-7: Disc space narrowing. Small broad-based disc bulge with accompanying osteophytes without neural impingement. Widely patent neural foramina. C7-T1: Focal calcification in the left ligamentum flavum no consequence. No disc bulging or protrusion. No foraminal  stenosis. Upper chest: Aortic atherosclerosis.  Lung apices are normal. Other: Prominent calcification in the carotid bifurcations bilaterally. Calcification in the carotid siphons and in the distal vertebral arteries. IMPRESSION: 1. No acute abnormality of the cervical spine. 2. 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 3. Atherosclerotic changes in the carotid and vertebral arteries and in the arch of the aorta. Electronically Signed   By: Lorriane Shire M.D.   On: 09/25/2018 11:45   Mr Brain Wo Contrast  Result Date: 09/25/2018 CLINICAL DATA:  Acute presentation with headache. EXAM: MRI HEAD WITHOUT CONTRAST TECHNIQUE: Multiplanar, multiecho pulse sequences of the brain and surrounding structures were obtained without intravenous contrast. COMPARISON:  Head CT same day.  MRI 04/01/2018 FINDINGS: Brain: Diffusion imaging does not show any acute or subacute infarction. Chronic small-vessel ischemic changes and wall air in degeneration affect the pons. No focal cerebellar insult. Cerebral hemispheres show old infarction in the right radiating white matter tracts and extensive old infarction in the left basal ganglia and radiating white matter tracts. Chronic small-vessel  ischemic changes affect the deep white matter. No mass lesion, acute hemorrhage, hydrocephalus or extra-axial collection. Hemosiderin deposition is present in the old infarction regions. Ex vacuo enlargement of the ventricles without suspicion of obstructive hydrocephalus. Vascular: Major vessels at the base of the brain show flow. Skull and upper cervical spine: Negative Sinuses/Orbits: Clear/normal Other: None IMPRESSION: No change seen since September of last year. Old infarctions at the base of the brain bilaterally, more extensive on the left than the right. Chronic small-vessel ischemic changes. No evidence of acute or subacute infarction. Electronically Signed   By: Nelson Chimes M.D.   On: 09/25/2018  12:16    Telemetry    NSR, 63, no acute events  - Personally Reviewed  ECG    NSR, 71, no acute changes  - Personally Reviewed  Cardiac Studies   Echocardiogram: 03/2018  - LV EF: 55%-65% - moderate LVH, moderately dilated LA - RA/RV normal - unable to estimate PAP - trivial TR noted, no other valve abnormalities noted  Patient Profile     63 y.o. female with history of stroke (2000) w/ chronic left hand weakness, prior TIA's (2010, 03/2018), T2DM, HLD, cerebrovascular and carotid arterial disease, chronic dizziness, atypical chest pain, Gr1DD, obesity & seizure with abnormal EEG (10/2016), who was admitted 09/25/2018 w/ headache, c/p, and mild trop elevation.  Assessment & Plan    1. Chest pain/CAD/demand ischemia:  Prior history of atypical chest pain with negative Myoview in 2011 & subsequent echos showing nl LV function without regional wall motion abnormality (most recent 03/2018).  She reports noticing chest pain 3/11 after waking from sleep with a headache in the the setting of a recent mechanical fall.  Chest pain lasted 5-6 hours without other symptoms & unrelieved by sublingual nitroglycerin given by her husband.  Troponin elevated initially 3/11 with a flat trend: 0.04 - 0.03 - 0.03.  Chest pain free overnight & this morning.  Due to history of coronary atherosclerosis noted on prior chest CT in the setting of known cerebrovascular/carotid disease she is scheduled for a lexiscan Myoview this morning to rule out ischemia.  Will follow results.  2. Headache/facial trauma:  S/p mechanical fall 09/20/2018 when she tripped over a chux pad.  Multiple head CT's & MRI with CT on 09/20/2018 notable for left maxillary soft tissue contusion/hematoma.  Neurology & IM following patient.  3. Essential HTN: BP elevated during admission with systolic overnight between 140's and 170's.  Currently taking maximum doses of amlodipine, enalapril, clonidine, & hydralazine.  Also on toprol xl 100 w/ HRs in  the 50's - 60's.  Would consider a thiazide, but due to overall sedentary status and mobility concerns s/p mechanical fall will avoid diuretic agents. Discussed with pharmacy - will transition from toprol to carvedilol to see if we can get more bp lowering effect. Given ongoing HTN despite multiple agents and w/ known vascular dzs, reasonable to pursue renal artery duplex as outpt.  4. Hyperlipidemia:  LDL 33 in September 2019.  Continue statin.  5. Carotid/cerebrovascualr disease with h/o TIA/Stroke:  No new signs/symptoms.  Continue aspirin, plavix & statin.  6.  Gr1DD: Euvolemic, address BP as stated above.  7.  T2DM:  A1C 10.5 in February 2020, insulin therapy per IM, needs outpatient f/u.  8. Hypokalemia:  Normal K+ & Mg this morning after supplementation 3/11.  Will continue to follow lab work.   Signed, Domingo Pulse [place], Student- NP 09/26/2018, 8:06 AM    For questions or updates, please  contact   Please consult [email or web address] for contact info under Cardiology/STEMI.

## 2018-09-26 NOTE — Progress Notes (Signed)
Discharged to home with husband.  No prescriptions needed.   Follow up appointments made

## 2018-09-26 NOTE — Care Management Obs Status (Signed)
[facility] NOTIFICATION   Patient Details  Name: Susan House MRN: [id number] Date of Birth: Feb 23, 1956   Medicare Observation Status Notification Given:  Yes    Elza Rafter, RN 09/26/2018, 2:37 PM

## 2018-09-26 NOTE — Discharge Summary (Signed)
[place] [facility], 63 y.o., DOB Jul 04, 1956, MRN [id number]. Admission date: 09/25/2018 Discharge Date 09/26/2018 Primary MD Alexis Goodell, MD Admitting Physician Dustin Flock, MD  Admission Diagnosis  Bad headache [R51] Elevated troponin [R79.89] Chest pain, unspecified type [R07.9]  Discharge Diagnosis   Active Problems: Chest pain atypical in nature status post stress test Headache possibly postconcussion headache Diabetes type 2 History of CVA Hyperlipidemia Accelerated hypertension History of seizure Morbid obesity    Hospital Course HISTORY OF PRESENT ILLNESS: Susan House  is a 63 y.o. female with a known history of osteoarthritis, diabetes type 2, GERD, hypertension, hyperlipidemia, previous history of CVA presenting to the hospital with complaint of chest pain and headache.  Patient states that she fell 2 weeks ago approximately and tripped.  She developed significant bruising on the left side of her face.  She does have a history of migraine but since the fall she has had episodes of headaches.  Patient actually was seen in the emergency room on March 6 and discharged home.  Now returns back with headache.  She reports that the headache is all over her head it is a dull aching pain.  Also on the left side is a little worse.  She has not had any nausea vomiting or diarrhea.  Along with a headache she is also having chest pain since last night. Pt is having left sided chest pain.  Patient was admitted under observation and was seen by cardiology and neurology.  Patient was recommended to be treated with Ultracet.  Also if daily continues to use Elavil.  Patient was seen by cardiology underwent a stress test.  Results are currently pending if stress is negative she will be discharged home.             Consults  neurology, cardiology  Significant Tests:  See full reports for all details     Dg Chest 2  View  Result Date: 09/25/2018 CLINICAL DATA:  62 y/o  F; chest pain. EXAM: CHEST - 2 VIEW COMPARISON:  06/26/2018 chest radiograph FINDINGS: Normal cardiac silhouette. Aortic atherosclerosis with calcification. Clear lungs. No pleural effusion or pneumothorax. No acute osseous abnormality is evident. Right upper quadrant cholecystectomy clips. Chronic fracture deformity of the right mid and distal clavicle. IMPRESSION: No acute pulmonary process identified. Electronically Signed   By: Kristine Garbe M.D.   On: 09/25/2018 05:25   Dg Knee 2 Views Left  Result Date: 09/20/2018 CLINICAL DATA:  Pt reports that she fell earlier today and landed on her left knee. Pt states she only hit the floor on the way down. Complaining of left knee pain at this time. Anterior bruising noted EXAM: LEFT KNEE - 1-2 VIEW COMPARISON:  None. FINDINGS: No fracture, bone lesion or dislocation. Mild narrowing of the medial joint space compartment. Calcifications are noted along the menisci. No joint effusion. Soft tissues are unremarkable. Skeletal structures are diffusely demineralized. IMPRESSION: 1. No fracture or acute finding. 2. Mile arthropathic changes with medial joint space compartment narrowing. No joint effusion. Electronically Signed   By: Lajean Manes M.D.   On: 09/20/2018 09:56   Ct Head Wo Contrast  Result Date: 09/25/2018 CLINICAL DATA:  Acute headache with normal neuro exam EXAM: CT HEAD WITHOUT CONTRAST TECHNIQUE: Contiguous axial images were obtained from the base of the skull through the vertex without intravenous contrast. COMPARISON:  09/21/2018 FINDINGS: Brain: Large remote left basal ganglia infarct, lateral lenticulostriate distribution. Cerebral  volume loss with ventriculomegaly. No acute infarct, hemorrhage, hydrocephalus, collection, or masslike finding Vascular: Atherosclerotic calcification without hyperdense vessel Skull: No acute finding. Sinuses/Orbits: Bilateral cataract resection.  IMPRESSION: 1. No acute finding or change from recent prior. 2. Remote left basal ganglia infarction. Electronically Signed   By: Monte Fantasia M.D.   On: 09/25/2018 05:17   Ct Head Wo Contrast  Result Date: 09/21/2018 CLINICAL DATA:  Headaches and visual loss for a few hours, history of recent fall with negative head CT, subsequent encounter EXAM: CT HEAD WITHOUT CONTRAST TECHNIQUE: Contiguous axial images were obtained from the base of the skull through the vertex without intravenous contrast. COMPARISON:  CT from the previous day FINDINGS: Brain: Diffuse atrophic changes are noted. Chronic white matter ischemic change is seen. Findings of prior infarct on the left with ex vacuo dilatation of the left lateral ventricle are again seen. No findings to suggest acute hemorrhage, acute infarction or space-occupying mass lesion. Vascular: No hyperdense vessel or unexpected calcification. Skull: Normal. Negative for fracture or focal lesion. Sinuses/Orbits: No acute finding. Other: None. IMPRESSION: Chronic atrophic and ischemic changes without acute abnormality. No significant change from the previous day. Electronically Signed   By: Inez Catalina M.D.   On: 09/21/2018 04:02   Ct Head Wo Contrast  Result Date: 09/20/2018 CLINICAL DATA:  Blunt trauma to the face. EXAM: CT HEAD WITHOUT CONTRAST CT MAXILLOFACIAL WITHOUT CONTRAST TECHNIQUE: Multidetector CT imaging of the head and maxillofacial structures were performed using the standard protocol without intravenous contrast. Multiplanar CT image reconstructions of the maxillofacial structures were also generated. COMPARISON:  Head CT, 03/31/2018 FINDINGS: CT HEAD FINDINGS Brain: No evidence of acute infarction, hemorrhage, hydrocephalus, extra-axial collection or mass lesion/mass effect. Old left MCA distribution infarct associated with left lateral ventricle ex vacuo dilation is stable. Patchy white matter hypoattenuation is also noted consistent with mild chronic  microvascular ischemic change. There is generalized ventricular and sulcal enlargement reflecting mild diffuse atrophy. Vascular: No hyperdense vessel or unexpected calcification. Skull: Normal. Negative for fracture or focal lesion. Other: None. CT MAXILLOFACIAL FINDINGS Osseous: No acute fracture or bone lesion. Normally aligned temporal mandibular joints. Orbits: Negative. No traumatic or inflammatory finding. Sinuses: Clear sinuses, middle ear cavities and mastoid air cells. Soft tissues: Left cheek soft tissue hematoma with edema extending to the left lateral periorbital soft tissues. No masses or adenopathy. Carotid artery vascular calcifications. IMPRESSION: HEAD CT 1. No acute intracranial abnormalities. 2. Old left MCA distribution infarct, atrophy and mild chronic microvascular ischemic change. MAXILLOFACIAL CT 1. Left maxillary soft tissue contusion/hematoma. 2. No fractures. 3. No acute orbital abnormality.  Clear sinuses. Electronically Signed   By: Lajean Manes M.D.   On: 09/20/2018 10:14   Ct Cervical Spine Wo Contrast  Result Date: 09/25/2018 CLINICAL DATA:  Neck pain. EXAM: CT CERVICAL SPINE WITHOUT CONTRAST TECHNIQUE: Multidetector CT imaging of the cervical spine was performed without intravenous contrast. Multiplanar CT image reconstructions were also generated. COMPARISON:  CT angiogram of the neck dated 04/01/2018 FINDINGS: Alignment: There is straightening of the cervical lordosis. 1.5 mm retrolisthesis of C6 on C7. Skull base and vertebrae: No fracture or bone destruction. No significant facet arthritis. Soft tissues and spinal canal: No prevertebral fluid or swelling. No visible canal hematoma. Disc levels:  C2-3: Negative. C3-4: Negative. C4-5: Tiny partially calcified disc bulge in the midline with no neural impingement. C5-6: Minimal disc space narrowing. Small broad-based disc bulge with no neural impingement. Widely patent neural foramina. C6-7: Disc space narrowing. Small  broad-based disc bulge with accompanying osteophytes without neural impingement. Widely patent neural foramina. C7-T1: Focal calcification in the left ligamentum flavum no consequence. No disc bulging or protrusion. No foraminal stenosis. Upper chest: Aortic atherosclerosis.  Lung apices are normal. Other: Prominent calcification in the carotid bifurcations bilaterally. Calcification in the carotid siphons and in the distal vertebral arteries. IMPRESSION: 1. No acute abnormality of the cervical spine. 2. 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 3. Atherosclerotic changes in the carotid and vertebral arteries and in the arch of the aorta. Electronically Signed   By: Lorriane Shire M.D.   On: 09/25/2018 11:45   Mr Brain Wo Contrast  Result Date: 09/25/2018 CLINICAL DATA:  Acute presentation with headache. EXAM: MRI HEAD WITHOUT CONTRAST TECHNIQUE: Multiplanar, multiecho pulse sequences of the brain and surrounding structures were obtained without intravenous contrast. COMPARISON:  Head CT same day.  MRI 04/01/2018 FINDINGS: Brain: Diffusion imaging does not show any acute or subacute infarction. Chronic small-vessel ischemic changes and wall air in degeneration affect the pons. No focal cerebellar insult. Cerebral hemispheres show old infarction in the right radiating white matter tracts and extensive old infarction in the left basal ganglia and radiating white matter tracts. Chronic small-vessel ischemic changes affect the deep white matter. No mass lesion, acute hemorrhage, hydrocephalus or extra-axial collection. Hemosiderin deposition is present in the old infarction regions. Ex vacuo enlargement of the ventricles without suspicion of obstructive hydrocephalus. Vascular: Major vessels at the base of the brain show flow. Skull and upper cervical spine: Negative Sinuses/Orbits: Clear/normal Other: None IMPRESSION: No change seen since September of last year. Old  infarctions at the base of the brain bilaterally, more extensive on the left than the right. Chronic small-vessel ischemic changes. No evidence of acute or subacute infarction. Electronically Signed   By: Nelson Chimes M.D.   On: 09/25/2018 12:16   Ct Maxillofacial Wo Contrast  Result Date: 09/20/2018 CLINICAL DATA:  Blunt trauma to the face. EXAM: CT HEAD WITHOUT CONTRAST CT MAXILLOFACIAL WITHOUT CONTRAST TECHNIQUE: Multidetector CT imaging of the head and maxillofacial structures were performed using the standard protocol without intravenous contrast. Multiplanar CT image reconstructions of the maxillofacial structures were also generated. COMPARISON:  Head CT, 03/31/2018 FINDINGS: CT HEAD FINDINGS Brain: No evidence of acute infarction, hemorrhage, hydrocephalus, extra-axial collection or mass lesion/mass effect. Old left MCA distribution infarct associated with left lateral ventricle ex vacuo dilation is stable. Patchy white matter hypoattenuation is also noted consistent with mild chronic microvascular ischemic change. There is generalized ventricular and sulcal enlargement reflecting mild diffuse atrophy. Vascular: No hyperdense vessel or unexpected calcification. Skull: Normal. Negative for fracture or focal lesion. Other: None. CT MAXILLOFACIAL FINDINGS Osseous: No acute fracture or bone lesion. Normally aligned temporal mandibular joints. Orbits: Negative. No traumatic or inflammatory finding. Sinuses: Clear sinuses, middle ear cavities and mastoid air cells. Soft tissues: Left cheek soft tissue hematoma with edema extending to the left lateral periorbital soft tissues. No masses or adenopathy. Carotid artery vascular calcifications. IMPRESSION: HEAD CT 1. No acute intracranial abnormalities. 2. Old left MCA distribution infarct, atrophy and mild chronic microvascular ischemic change. MAXILLOFACIAL CT 1. Left maxillary soft tissue contusion/hematoma. 2. No fractures. 3. No acute orbital abnormality.   Clear sinuses. Electronically Signed   By: Lajean Manes M.D.   On: 09/20/2018 10:14       Today   Subjective:   Kristeen Mans   denies any headache or chest pain  Objective:   Blood pressure Marland Kitchen)  142/62, pulse 63, temperature 98.5 F (36.9 C), temperature source Oral, resp. rate 18, height 5' 1"  (1.549 m), weight 106.1 kg, SpO2 98 %.  .  Intake/Output Summary (Last 24 hours) at 09/26/2018 1419 Last data filed at 09/26/2018 0100 Gross per 24 hour  Intake 50 ml  Output 400 ml  Net -350 ml    Exam VITAL SIGNS: Blood pressure (!) 142/62, pulse 63, temperature 98.5 F (36.9 C), temperature source Oral, resp. rate 18, height 5' 1"  (1.549 m), weight 106.1 kg, SpO2 98 %.  GENERAL:  63 y.o.-year-old patient lying in the bed with no acute distress.  EYES: Pupils equal, round, reactive to light and accommodation. No scleral icterus. Extraocular muscles intact.  HEENT: Head atraumatic, normocephalic. Oropharynx and nasopharynx clear.  NECK:  Supple, no jugular venous distention. No thyroid enlargement, no tenderness.  LUNGS: Normal breath sounds bilaterally, no wheezing, rales,rhonchi or crepitation. No use of accessory muscles of respiration.  CARDIOVASCULAR: S1, S2 normal. No murmurs, rubs, or gallops.  ABDOMEN: Soft, nontender, nondistended. Bowel sounds present. No organomegaly or mass.  EXTREMITIES: No pedal edema, cyanosis, or clubbing.  NEUROLOGIC: Cranial nerves II through XII are intact. Muscle strength 5/5 in all extremities. Sensation intact. Gait not checked.  PSYCHIATRIC: The patient is alert and oriented x 3.  SKIN: No obvious rash, lesion, or ulcer.   Data Review     CBC w Diff:  Lab Results  Component Value Date   WBC 7.4 09/25/2018   HGB 12.9 09/25/2018   HGB 12.4 12/25/2017   HCT 39.2 09/25/2018   HCT 38.2 12/25/2017   PLT 280 09/25/2018   PLT 301 12/25/2017   LYMPHOPCT 22 06/26/2018   LYMPHOPCT 17.7 01/18/2014   MONOPCT 9 06/26/2018   MONOPCT 8.4  01/18/2014   EOSPCT 1 06/26/2018   EOSPCT 5.4 01/18/2014   BASOPCT 1 06/26/2018   BASOPCT 0.6 01/18/2014   CMP:  Lab Results  Component Value Date   NA 141 09/26/2018   NA 139 12/25/2017   NA 140 06/17/2014   K 4.1 09/26/2018   K 4.1 06/17/2014   CL 110 09/26/2018   CL 105 06/17/2014   CO2 22 09/26/2018   CO2 27 06/17/2014   BUN 15 09/26/2018   BUN 14 12/25/2017   BUN 35 (H) 06/17/2014   CREATININE 1.15 (H) 09/26/2018   CREATININE 1.29 06/17/2014   GLU 249 03/16/2014   PROT 7.0 06/26/2018   PROT 6.8 12/25/2017   PROT 6.5 01/18/2014   ALBUMIN 3.9 06/26/2018   ALBUMIN 4.3 12/25/2017   ALBUMIN 3.0 (L) 01/18/2014   BILITOT 0.7 06/26/2018   BILITOT 0.4 12/25/2017   BILITOT 0.3 01/18/2014   ALKPHOS 80 06/26/2018   ALKPHOS 97 01/18/2014   AST 26 06/26/2018   AST 21 01/18/2014   ALT 16 06/26/2018   ALT 18 01/18/2014  .  Micro Results No results found for this or any previous visit (from the past 240 hour(s)).      Code Status Orders  (From admission, onward)         Start     Ordered   09/25/18 0921  Do not attempt resuscitation (DNR)  Continuous    Question Answer Comment  In the event of cardiac or respiratory ARREST Do not call a "code blue"   In the event of cardiac or respiratory ARREST Do not perform Intubation, CPR, defibrillation or ACLS   In the event of cardiac or respiratory ARREST Use medication by any route, position, wound  care, and other measures to relive pain and suffering. May use oxygen, suction and manual treatment of airway obstruction as needed for comfort.      09/25/18 0921        Code Status History    Date Active Date Inactive Code Status Order ID Comments User Context   08/28/2018 2226 08/30/2018 1657 Full Code [id number]  [name], MD Inpatient   03/31/2018 2006 04/02/2018 1733 DNR [id number]  Sela Hua, MD Inpatient   10/03/2017 2208 10/04/2017 1718 Full Code [id number]  Dustin Flock, MD Inpatient   10/15/2016 2201 10/17/2016  1624 Full Code [id number]  Vaughan Basta, MD ED   07/08/2016 0437 07/11/2016 1610 Full Code [id number]  Saundra Shelling, MD ED   12/26/2015 1847 12/28/2015 2027 Full Code [id number]  Idelle Crouch, MD Inpatient   07/22/2015 0735 07/23/2015 1505 Full Code [id number]  Saundra Shelling, MD Inpatient    Advance Directive Documentation     Most Recent Value  Type of Advance Directive  Out of facility DNR (pink MOST or yellow form), Living will  Pre-existing out of facility DNR order (yellow form or pink MOST form)  -  "MOST" Form in Place?  -          Follow-up Information    Gollan, Kathlene November, MD Follow up in 1 week(s).   Specialty:  Cardiology Contact information: [place] [address] [phone]        pcp Follow up in 1 week(s).           Discharge Medications   Allergies as of 09/26/2018      Reactions   Morphine And Related Anaphylaxis   Fentanyl Other (See Comments)   Unknown reaction.   Reglan [metoclopramide]    Other reaction(s): Unknown Elevated BP   Simvastatin Other (See Comments)   Muscle pain   Betadine [povidone Iodine] Rash   Iodine Rash   Other reaction(s): Unknown-not allergic to ct contrast-ars 04/01/18   Tetracyclines & Related Rash   Other reaction(s): Unknown      Medication List    STOP taking these medications   fluconazole 150 MG tablet Commonly known as:  DIFLUCAN   HYDROcodone-acetaminophen 10-325 MG tablet Commonly known as:  NORCO     TAKE these medications   Accu-Chek Aviva Plus w/Device Kit Check sugar three times daily DX E11.9-needs a meter   accu-chek softclix lancets Check sugar three times daily DX E11.9   Accu-Chek Softclix Lancets lancets CHECK BLOOD SUGAR THREE TIMES DAILY   amLODipine 10 MG tablet Commonly known as:  NORVASC Take 1 tablet by mouth daily   aspirin 81 MG chewable tablet Chew 1 tablet (81 mg total) by mouth daily. Start taking on:  September 27, 2018    atorvastatin 20 MG tablet Commonly known as:  LIPITOR Take 1 tablet (20 mg total) by mouth daily.   butalbital-acetaminophen-caffeine 50-325-40 MG tablet Commonly known as:  FIORICET, ESGIC Take 1 tablet by mouth every 6 (six) hours as needed for headache.   cephALEXin 500 MG capsule Commonly known as:  KEFLEX Take 1 capsule (500 mg total) by mouth 2 (two) times daily.   cloNIDine 0.3 MG tablet Commonly known as:  CATAPRES Take 1 tablet (0.3 mg total) by mouth 2 (two) times daily.   clopidogrel 75 MG tablet Commonly known as:  Plavix Take 1 tablet (75 mg total) by mouth daily.   enalapril 20 MG tablet Commonly known as:  VASOTEC Take 1  tablet (20 mg total) by mouth 2 (two) times daily.   FreeStyle Libre 14 Day Reader Kerrin Mo LENGTH OF NEED: LIFETIME - UNLESS SPECIFIED OTHERWISE   FreeStyle Libre 14 Day Sensor Misc LENGTH OF NEED: LIFETIME - UNLESS SPECIFIED OTHERWISE   glucose blood test strip Commonly known as:  Accu-Chek Aviva Plus TEST BLOOD SUGAR THREE TIMES DAILY   hydrALAZINE 100 MG tablet Commonly known as:  APRESOLINE Take 1 tablet (100 mg total) by mouth 2 (two) times daily.   insulin degludec 100 UNIT/ML Sopn FlexTouch Pen Commonly known as:  [place] FlexTouch Up to 30 units daily. What changed:    how much to take  how to take this  when to take this  additional instructions   levETIRAcetam 500 MG tablet Commonly known as:  KEPPRA Take 1 tablet (500 mg total) by mouth 2 (two) times daily. 500 mg bid.   metFORMIN 1000 MG tablet Commonly known as:  GLUCOPHAGE Take 1 tablet (1,000 mg total) by mouth 2 (two) times daily.   metoprolol succinate 100 MG 24 hr tablet Commonly known as:  TOPROL-XL Take 1 tablet (100 mg total) by mouth daily.   mupirocin cream 2 % Commonly known as:  BACTROBAN Apply 1 application topically daily.   nitroGLYCERIN 0.4 MG SL tablet Commonly known as:  NITROSTAT Place 1 tablet (0.4 mg total) under the tongue every 5  (five) minutes as needed for chest pain.   oxybutynin 5 MG tablet Commonly known as:  DITROPAN Take 1 tablet (5 mg total) by mouth 2 (two) times daily.   pantoprazole 40 MG tablet Commonly known as:  PROTONIX Take 1 tablet (40 mg total) by mouth daily.   traMADol-acetaminophen 37.5-325 MG tablet Commonly known as:  Ultracet Take 1 tablet by mouth every 6 (six) hours as needed.          Total Time in preparing paper work, data evaluation and todays exam - 25 minutes  Dustin Flock M.D on 09/26/2018 at 2:19 PM [place]  [phone]

## 2018-09-26 NOTE — Consult Note (Addendum)
error 

## 2018-09-26 NOTE — Progress Notes (Signed)
Subjective: Patient reports that she has no headache today.  Dizziness resolved  Objective: Current vital signs: BP (!) 142/62   Pulse 63   Temp 98.5 F (36.9 C) (Oral)   Resp 18   Ht 5\' 1"  (1.549 m)   Wt 106.1 kg   SpO2 98%   BMI 44.20 kg/m  Vital signs in last 24 hours: Temp:  [98.5 F (36.9 C)-98.6 F (37 C)] 98.5 F (36.9 C) (03/12 0819) Pulse Rate:  [59-66] 63 (03/12 0819) Resp:  [16-18] 18 (03/12 0819) BP: (140-170)/(52-66) 142/62 (03/12 0819) SpO2:  [98 %-100 %] 98 % (03/12 0819) Weight:  [106.1 kg] 106.1 kg (03/11 1439)  Intake/Output from previous day: 03/11 0701 - 03/12 0700 In: 1100 [IV Piggyback:1100] Out: 400 [Urine:400] Intake/Output this shift: No intake/output data recorded. Nutritional status:  Diet Order            Diet NPO time specified  Diet effective midnight              Neurologic Exam: Mental Status: Alert, oriented, thought content appropriate. Speechclear without evidence of aphasia.Able to follow 3 step commands without difficulty. Attention span and concentration seemed appropriate  Cranial Nerves: II: Discs flat bilaterally;LHH, pupils equal, round, reactive to light and accommodation III,IV, VI: ptosis not present, extra-ocular motions intact bilaterally V,VII: smileasymmetricmildlyon the left, facial light touch sensationdecreased on the left VIII: hearing normal bilaterally IX,X: gag reflexdiffered XI: bilateral shoulder shrug XII: midline tongue extension Motor: Left hemiparesis Sensory: Pinprick and light touchdecreased on thelefthand Deep Tendon Reflexes: 2+ and symmetric throughout   Lab Results: Basic Metabolic Panel: Recent Labs  Lab 09/25/18 0525 09/25/18 1447 09/26/18 0522  NA 140  --  141  K 3.3*  --  4.1  CL 106  --  110  CO2 20*  --  22  GLUCOSE 274*  --  115*  BUN 17  --  15  CREATININE 1.19*  --  1.15*  CALCIUM 9.5  --  8.6*  MG  --  1.5* 2.1    Liver Function Tests: No results for  input(s): AST, ALT, ALKPHOS, BILITOT, PROT, ALBUMIN in the last 168 hours. No results for input(s): LIPASE, AMYLASE in the last 168 hours. No results for input(s): AMMONIA in the last 168 hours.  CBC: Recent Labs  Lab 09/25/18 0525  WBC 7.4  HGB 12.9  HCT 39.2  MCV 86.3  PLT 280    Cardiac Enzymes: Recent Labs  Lab 09/25/18 0525 09/25/18 0752 09/25/18 1447  TROPONINI 0.04* 0.03* 0.03*    Lipid Panel: No results for input(s): CHOL, TRIG, HDL, CHOLHDL, VLDL, LDLCALC in the last 168 hours.  CBG: Recent Labs  Lab 09/25/18 1737 09/25/18 2054 09/25/18 2251 09/26/18 0551 09/26/18 0717  GLUCAP 149* 136* 155* 113* 129*    Microbiology: Results for orders placed or performed in visit on 08/23/18  Urine Culture     Status: Abnormal   Collection Time: 08/23/18  2:53 PM  Result Value Ref Range Status   Urine Culture, Routine Final report (A)  Final   Organism ID, Bacteria Klebsiella pneumoniae (A)  Final    Comment: Greater than 100,000 colony forming units per mL Cefazolin <=4 ug/mL Cefazolin with an MIC <=16 predicts susceptibility to the oral agents cefaclor, cefdinir, cefpodoxime, cefprozil, cefuroxime, cephalexin, and loracarbef when used for therapy of uncomplicated urinary tract infections due to E. coli, Klebsiella pneumoniae, and Proteus mirabilis.    Antimicrobial Susceptibility Comment  Final    Comment:       **  S = Susceptible; I = Intermediate; R = Resistant **                    P = Positive; N = Negative             MICS are expressed in micrograms per mL    Antibiotic                 RSLT#1    RSLT#2    RSLT#3    RSLT#4 Amoxicillin/Clavulanic Acid    S Ampicillin                     R Cefepime                       S Ceftriaxone                    S Cefuroxime                     S Ciprofloxacin                  S Ertapenem                      S Gentamicin                     S Imipenem                       S Levofloxacin                    S Meropenem                      S Nitrofurantoin                 S Piperacillin/Tazobactam        S Tetracycline                   S Tobramycin                     S Trimethoprim/Sulfa             S     Coagulation Studies: No results for input(s): LABPROT, INR in the last 72 hours.  Imaging: Dg Chest 2 View  Result Date: 09/25/2018 CLINICAL DATA:  63 y/o  F; chest pain. EXAM: CHEST - 2 VIEW COMPARISON:  06/26/2018 chest radiograph FINDINGS: Normal cardiac silhouette. Aortic atherosclerosis with calcification. Clear lungs. No pleural effusion or pneumothorax. No acute osseous abnormality is evident. Right upper quadrant cholecystectomy clips. Chronic fracture deformity of the right mid and distal clavicle. IMPRESSION: No acute pulmonary process identified. Electronically Signed   By: Kristine Garbe M.D.   On: 09/25/2018 05:25   Ct Head Wo Contrast  Result Date: 09/25/2018 CLINICAL DATA:  Acute headache with normal neuro exam EXAM: CT HEAD WITHOUT CONTRAST TECHNIQUE: Contiguous axial images were obtained from the base of the skull through the vertex without intravenous contrast. COMPARISON:  09/21/2018 FINDINGS: Brain: Large remote left basal ganglia infarct, lateral lenticulostriate distribution. Cerebral volume loss with ventriculomegaly. No acute infarct, hemorrhage, hydrocephalus, collection, or masslike finding Vascular: Atherosclerotic calcification without hyperdense vessel Skull: No acute finding. Sinuses/Orbits: Bilateral cataract resection. IMPRESSION: 1. No acute finding or change from recent prior. 2. Remote left basal ganglia infarction. Electronically  Signed   By: Monte Fantasia M.D.   On: 09/25/2018 05:17   Ct Cervical Spine Wo Contrast  Result Date: 09/25/2018 CLINICAL DATA:  Neck pain. EXAM: CT CERVICAL SPINE WITHOUT CONTRAST TECHNIQUE: Multidetector CT imaging of the cervical spine was performed without intravenous contrast. Multiplanar CT image reconstructions  were also generated. COMPARISON:  CT angiogram of the neck dated 04/01/2018 FINDINGS: Alignment: There is straightening of the cervical lordosis. 1.5 mm retrolisthesis of C6 on C7. Skull base and vertebrae: No fracture or bone destruction. No significant facet arthritis. Soft tissues and spinal canal: No prevertebral fluid or swelling. No visible canal hematoma. Disc levels:  C2-3: Negative. C3-4: Negative. C4-5: Tiny partially calcified disc bulge in the midline with no neural impingement. C5-6: Minimal disc space narrowing. Small broad-based disc bulge with no neural impingement. Widely patent neural foramina. C6-7: Disc space narrowing. Small broad-based disc bulge with accompanying osteophytes without neural impingement. Widely patent neural foramina. C7-T1: Focal calcification in the left ligamentum flavum no consequence. No disc bulging or protrusion. No foraminal stenosis. Upper chest: Aortic atherosclerosis.  Lung apices are normal. Other: Prominent calcification in the carotid bifurcations bilaterally. Calcification in the carotid siphons and in the distal vertebral arteries. IMPRESSION: 1. No acute abnormality of the cervical spine. 2. 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 3. Atherosclerotic changes in the carotid and vertebral arteries and in the arch of the aorta. Electronically Signed   By: Lorriane Shire M.D.   On: 09/25/2018 11:45   Mr Brain Wo Contrast  Result Date: 09/25/2018 CLINICAL DATA:  Acute presentation with headache. EXAM: MRI HEAD WITHOUT CONTRAST TECHNIQUE: Multiplanar, multiecho pulse sequences of the brain and surrounding structures were obtained without intravenous contrast. COMPARISON:  Head CT same day.  MRI 04/01/2018 FINDINGS: Brain: Diffusion imaging does not show any acute or subacute infarction. Chronic small-vessel ischemic changes and wall air in degeneration affect the pons. No focal cerebellar insult. Cerebral hemispheres show  old infarction in the right radiating white matter tracts and extensive old infarction in the left basal ganglia and radiating white matter tracts. Chronic small-vessel ischemic changes affect the deep white matter. No mass lesion, acute hemorrhage, hydrocephalus or extra-axial collection. Hemosiderin deposition is present in the old infarction regions. Ex vacuo enlargement of the ventricles without suspicion of obstructive hydrocephalus. Vascular: Major vessels at the base of the brain show flow. Skull and upper cervical spine: Negative Sinuses/Orbits: Clear/normal Other: None IMPRESSION: No change seen since September of last year. Old infarctions at the base of the brain bilaterally, more extensive on the left than the right. Chronic small-vessel ischemic changes. No evidence of acute or subacute infarction. Electronically Signed   By: Nelson Chimes M.D.   On: 09/25/2018 12:16    Medications:  I have reviewed the patient's current medications. Scheduled: . amLODipine  10 mg Oral Daily  . aspirin  81 mg Oral Daily  . atorvastatin  20 mg Oral Daily  . cloNIDine  0.3 mg Oral BID  . clopidogrel  75 mg Oral Daily  . doxazosin  1 mg Oral Daily  . enalapril  20 mg Oral BID  . enoxaparin (LOVENOX) injection  40 mg Subcutaneous Q12H  . hydrALAZINE  100 mg Oral Q8H  . insulin aspart  0-9 Units Subcutaneous TID WC  . insulin glargine  20 Units Subcutaneous BID  . levETIRAcetam  500 mg Oral BID  . metFORMIN  1,000 mg Oral BID  . metoprolol succinate  100 mg  Oral Daily  . oxybutynin  5 mg Oral BID  . pantoprazole  40 mg Oral Daily  . sodium chloride flush  3 mL Intravenous Once  . sodium chloride flush  3 mL Intravenous Q12H    Assessment/Plan: Patient improved today.  No headache and no dizziness.    Recommendations: No further neurologic intervention is recommended at this time.  If further questions arise, please call or page at that time.  Thank you for allowing neurology to participate in the  care of this patient.    LOS: 0 days   Alexis Goodell, MD Neurology [phone] 09/26/2018  12:51 PM

## 2018-09-30 ENCOUNTER — Ambulatory Visit: Payer: Self-pay | Admitting: Pharmacist

## 2018-09-30 DIAGNOSIS — E118 Type 2 diabetes mellitus with unspecified complications: Secondary | ICD-10-CM

## 2018-09-30 DIAGNOSIS — Z7409 Other reduced mobility: Secondary | ICD-10-CM

## 2018-09-30 DIAGNOSIS — I63419 Cerebral infarction due to embolism of unspecified middle cerebral artery: Secondary | ICD-10-CM

## 2018-09-30 DIAGNOSIS — I69993 Ataxia following unspecified cerebrovascular disease: Secondary | ICD-10-CM

## 2018-09-30 DIAGNOSIS — I69354 Hemiplegia and hemiparesis following cerebral infarction affecting left non-dominant side: Secondary | ICD-10-CM

## 2018-09-30 DIAGNOSIS — W19XXXA Unspecified fall, initial encounter: Secondary | ICD-10-CM

## 2018-09-30 NOTE — Chronic Care Management (AMB) (Signed)
  Chronic Care Management   Note  09/30/2018 Name: Susan House MRN: [id number] DOB: 10/16/1955  Successful outreach to Susan House to reschedule CCM office visit that was previously cancelled due to hospitalization.   Rescheduled for Monday, April 6 at [place]  Follow up plan: Face to Face appointment with CCM team member scheduled foR:  Monday April 6 at 10 am  Ruben Reason, PharmD Clinical Pharmacist [place] [phone]

## 2018-10-01 ENCOUNTER — Ambulatory Visit: Payer: Self-pay | Admitting: Family Medicine

## 2018-10-14 ENCOUNTER — Telehealth: Payer: Self-pay | Admitting: Adult Health

## 2018-10-14 ENCOUNTER — Ambulatory Visit (INDEPENDENT_AMBULATORY_CARE_PROVIDER_SITE_OTHER): Payer: Medicare Other | Admitting: Adult Health

## 2018-10-14 ENCOUNTER — Encounter: Payer: Self-pay | Admitting: Adult Health

## 2018-10-14 ENCOUNTER — Other Ambulatory Visit: Payer: Self-pay

## 2018-10-14 DIAGNOSIS — I69322 Dysarthria following cerebral infarction: Secondary | ICD-10-CM

## 2018-10-14 DIAGNOSIS — Z8673 Personal history of transient ischemic attack (TIA), and cerebral infarction without residual deficits: Secondary | ICD-10-CM | POA: Diagnosis not present

## 2018-10-14 DIAGNOSIS — R569 Unspecified convulsions: Secondary | ICD-10-CM

## 2018-10-14 NOTE — Telephone Encounter (Signed)
Please call patient to schedule office visit follow-up with Dr. Brett Fairy in 6 months time

## 2018-10-14 NOTE — Progress Notes (Signed)
[facility] [address] [place] [address] [phone]     Virtual Visit via Telephone Note  I connected with Susan House on 10/14/18 at 11:15 AM EDT by telephone located at [facility] and verified that I am speaking with the correct person using two identifiers who reports being located in her own home.    I discussed the limitations, risks, security and privacy concerns of performing an evaluation and management service by telephone and the availability of in person appointments. I also discussed with the patient that there may be a patient responsible charge related to this service. The patient expressed understanding and agreed to proceed.   History of Present Illness:  Susan House is a 63 y.o. femalewith underlying extensive medical history and has been followed in this office by Dr. 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  She was initially scheduled for face-to-face office visit today at this time but due to [place], face-to-face office visit rescheduled for non-face-to-face telephone visit.   She was last seen in this office by Dr. Brett Fairy on 04/30/2018 after TIA episode in 03/2018 with transient symptoms of slurred speech and worsening baseline left hemiparesis.  She states she has been doing well from a stroke standpoint without any recurrent episodes of speech difficulties or worsening baseline left hemiparesis.  She has continued on aspirin 81 mg without side effects of bleeding or bruising along with atorvastatin without side effects myalgias.  She continues to monitor BP and BG at home which have both been stable per patient report.  She continues to follow with PCP for HTN, HLD and DM management.  She continues on Keppra 500 mg twice daily which she tolerates well and denies any recent seizure activity.  She unfortunately presented to the ED on 09/20/2018  after experiencing a mechanical fall with resultant facial and bilateral knee bruising but no other injury and return to ED on 09/21/2018 and 09/25/2018 with ongoing severe headache.  Imaging obtained which was negative for acute abnormalities or concerns.  She was discharged with tramadol-acetaminophen 37.5-325 mg tablet.  She states her headaches have almost completely resolved and has not taken tramadol-acetaminophen tablet in the past few days.  She denies any recurrent falls.      Observations/Objective:  General: Pleasant middle-aged Caucasian female with slowed speech who asked and answered questions appropriately throughout conversation  Reviewed all recent imaging and labs from ED admissions  Assessment and Plan:  [name] is a 63 year old female with underlying medical history of DM, HTN, HLD, stroke (2000) with resultant left hemiparesis, TIA and potential seizure activity.  She has been stable from a neurological standpoint without any new or worsening stroke/TIA symptoms.  -Continue aspirin 81 mg and atorvastatin for secondary stroke prevention -Continue to follow with PCP for HTN, HLD and DM management -Continue Keppra 500 mg twice daily for seizure prophylaxis -maintain strict control of hypertension with blood pressure goal below 130/90, diabetes with hemoglobin A1c goal below 6.5% and lipids with LDL cholesterol goal below 70 mg/dL. I also advised the patient to eat a healthy diet with plenty of whole grains, cereals, fruits and vegetables, exercise regularly and maintain ideal body weight   Follow Up Instructions:   Follow-up in 6 months with Dr. Brett Fairy or call earlier if needed    I discussed the assessment and treatment plan with the patient.  The patient was provided an opportunity to ask questions  and all were answered to their satisfaction. The patient agreed with the plan and verbalized an understanding of the instructions.   I provided 25 minutes of  non-face-to-face time during this encounter.    Venancio Poisson, AGNP-BC  [facility] [address] [place], [place] [address]  Phone [phone] Fax [phone] Note: This document was prepared with digital dictation and possible smart phrase technology. Any transcriptional errors that result from this process are unintentional.

## 2018-10-16 ENCOUNTER — Telehealth: Payer: Self-pay

## 2018-10-16 IMAGING — US US CAROTID DUPLEX BILAT
1 series · 13 of 24 positions shown · non-contrast
Comparison: 10/04/2017

CLINICAL DATA: Left-sided weakness and slurred speech. History of
hypertension, hyperlipidemia and diabetes.

EXAM:
BILATERAL CAROTID DUPLEX ULTRASOUND
TECHNIQUE: Gray scale imaging, color Doppler and duplex ultrasound were
performed of bilateral carotid and vertebral arteries in the neck.

[Series 1: us carotid duplex bilat · 13 of 65 slices shown]
[im 1/65]
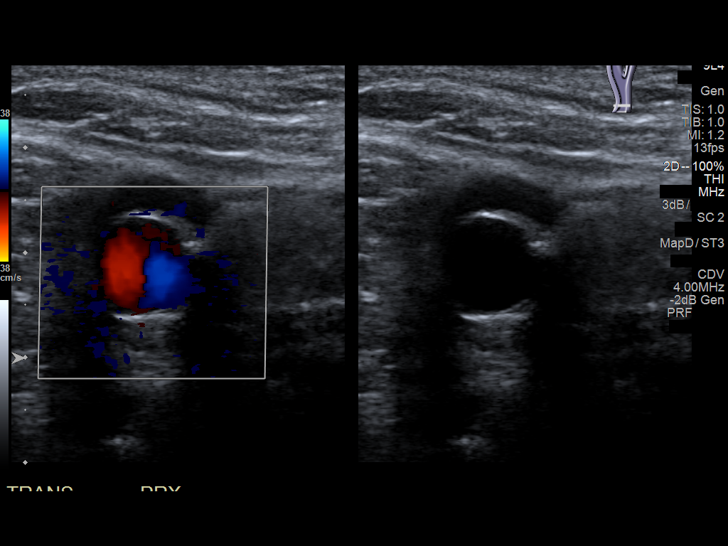
[im 6/65]
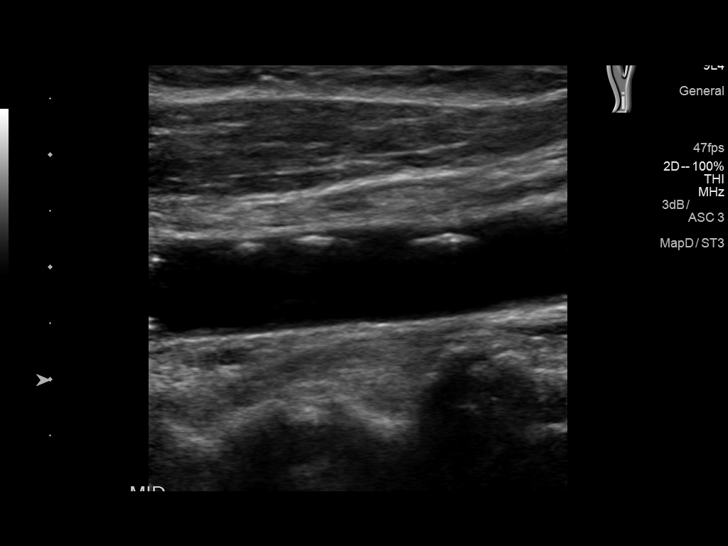
[im 12/65]
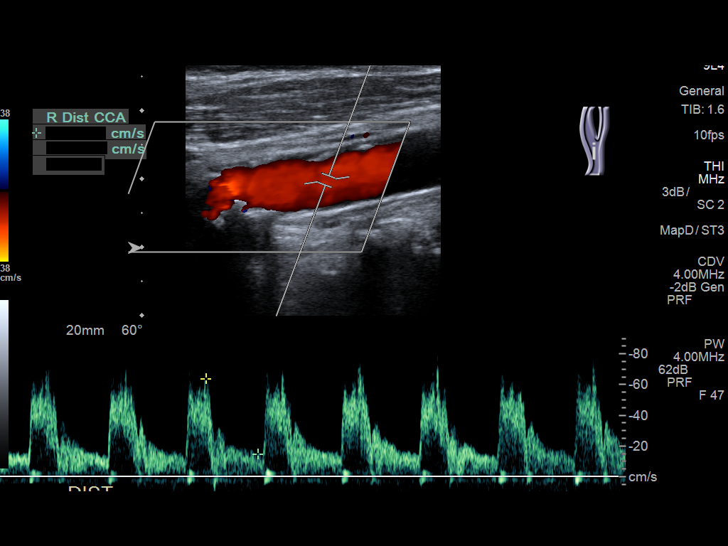
[im 17/65]
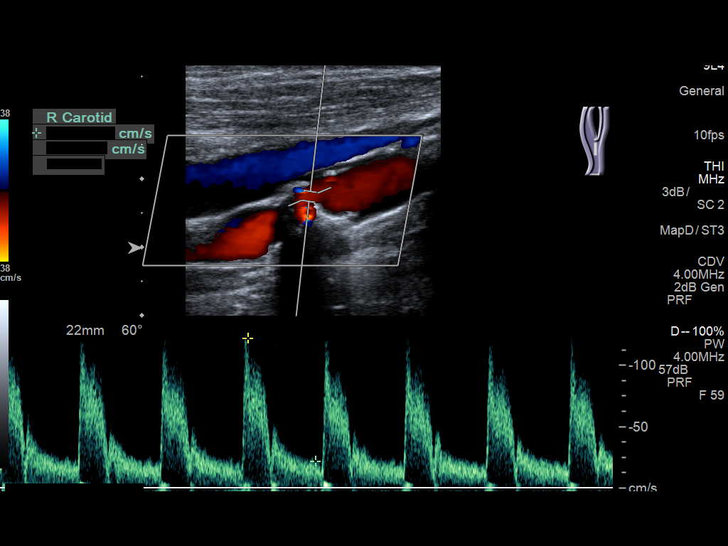
[im 23/65]
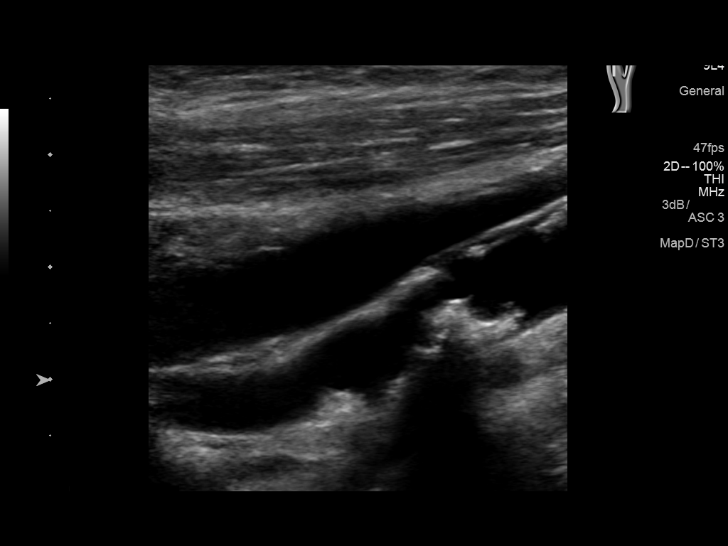
[im 28/65]
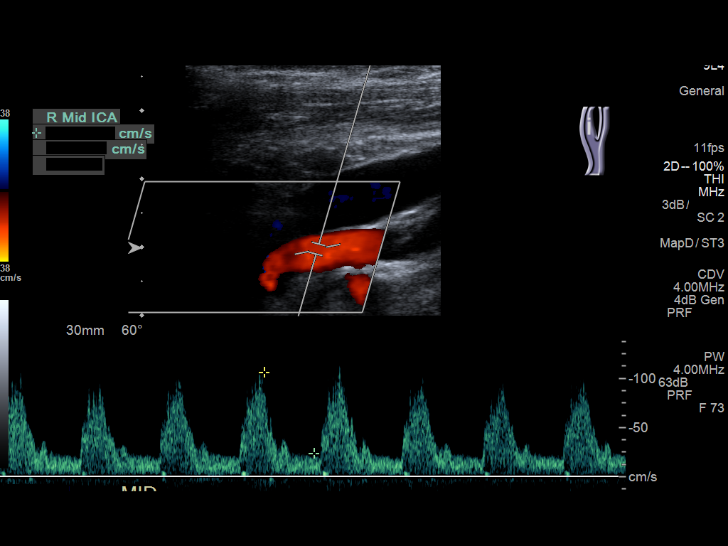
[im 34/65]
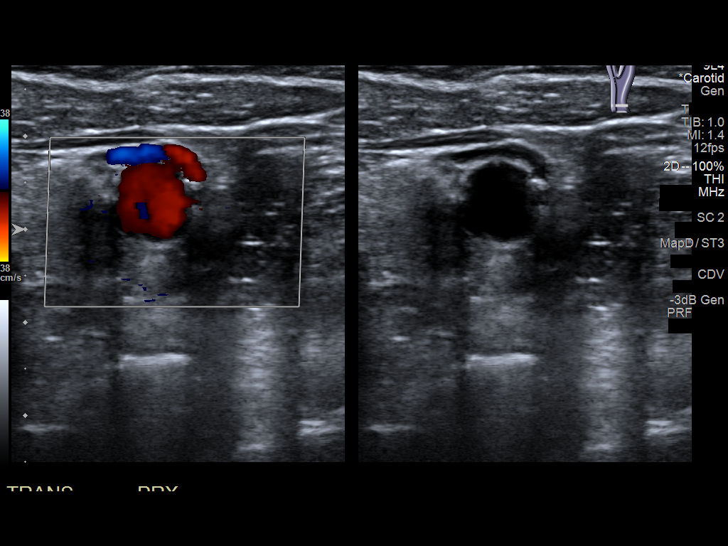
[im 37/65]
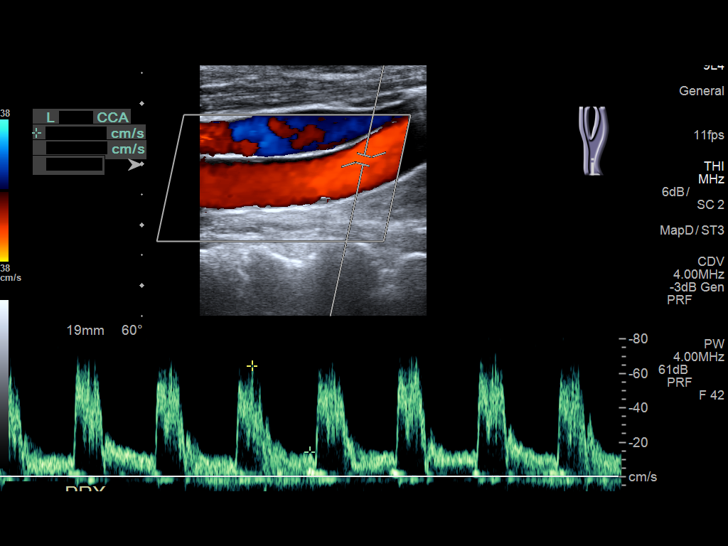
[im 42/65]
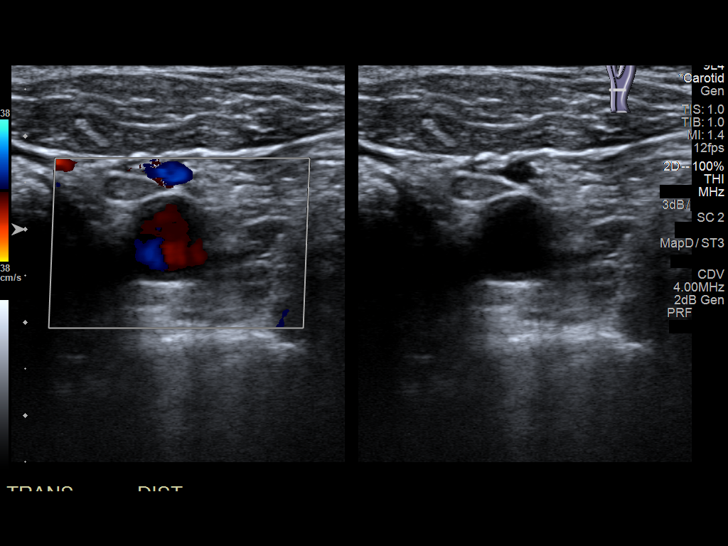
[im 48/65]
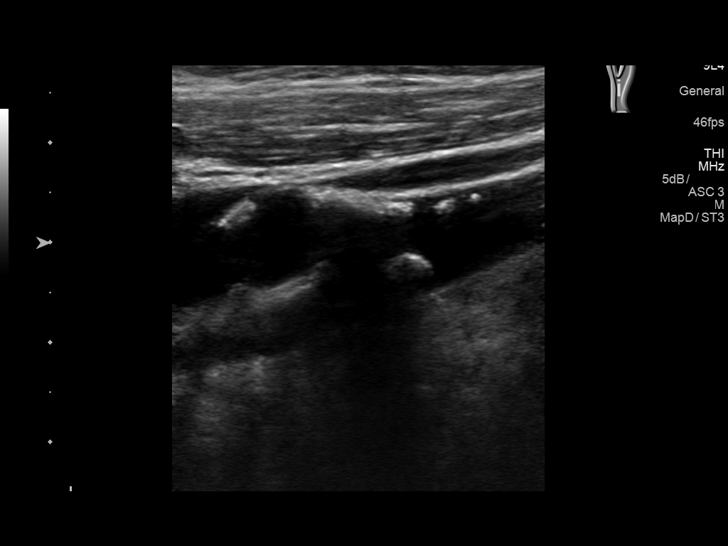
[im 53/65]
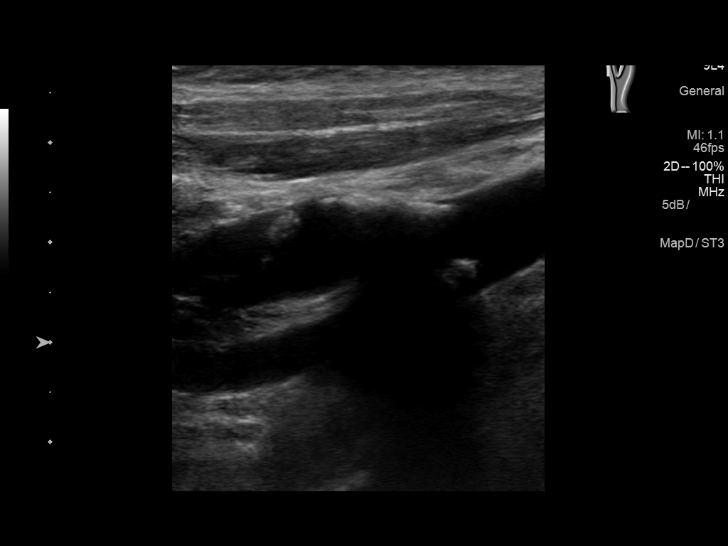
[im 59/65]
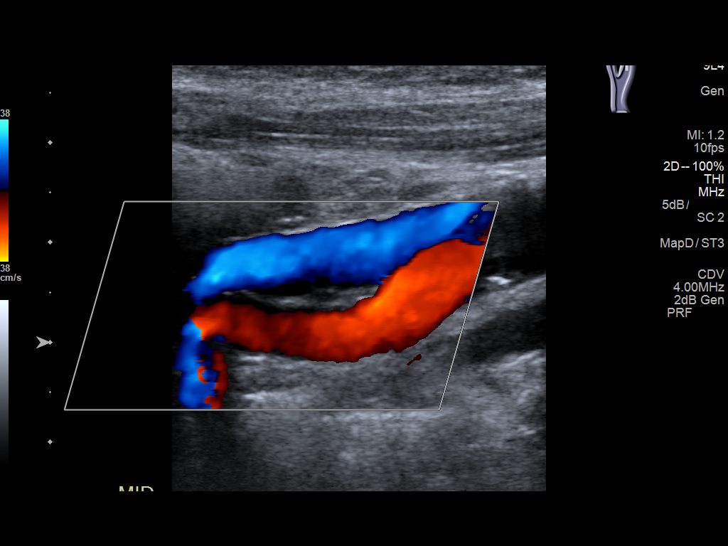
[im 65/65]
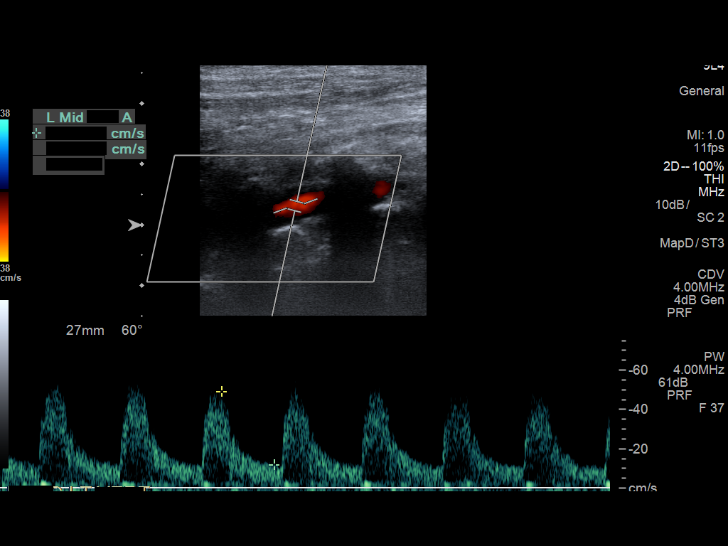

[13 of 24 positions shown; findings below may reference images not displayed]

FINDINGS: Criteria: Quantification of carotid stenosis is based on velocity
parameters that correlate the residual internal carotid diameter
with NASCET-based stenosis levels, using the diameter of the distal
internal carotid lumen as the denominator for stenosis measurement.

The following velocity measurements were obtained:

RIGHT

ICA:  196/37 cm/sec

CCA:  64/15 cm/sec

SYSTOLIC ICA/CCA RATIO:

ECA:  197 cm/sec

LEFT

ICA:  75/17 cm/sec

CCA:  57/13 cm/sec

SYSTOLIC ICA/CCA RATIO:

ECA:  153 cm/sec

RIGHT CAROTID ARTERY: Similar and stable appearance of moderate
amount of calcified plaque at the level of the distal common carotid
artery, carotid bulb and extending into the proximal internal and
external carotid arteries. Turbulent flow present in the proximal
right ICA with stable velocities corresponding to an estimated
50-69% stenosis. Velocities and systolic ratio almost meet criteria
for a greater than 70% stenosis.

RIGHT VERTEBRAL ARTERY: Antegrade flow with normal waveform and
velocity.

LEFT CAROTID ARTERY: Stable moderate calcified plaque at the level
of the carotid bulb and proximal left ICA. Velocities are stable and
estimated left ICA stenosis remains less than 50%.

LEFT VERTEBRAL ARTERY: Antegrade flow with normal waveform and
velocity.
IMPRESSION: 1. Stable appearance of moderate plaque at the right carotid
bifurcation with estimated 50-69% proximal right ICA stenosis.
Velocity and velocity ratio almost meet criteria for a greater than
70% stenosis.
2. Stable appearance of plaque at the left carotid bifurcation with
estimated less than 50% proximal left ICA stenosis.

## 2018-10-16 NOTE — Telephone Encounter (Signed)
Patient called the office requesting an appointment for elevated blood pressure. She states her blood pressure is 160/60, and she is having headaches and blurred vision. Patient denies any chest pain, shortness of breath, numbness, weakness or nausea or vomiting. I consulted with Dr. Rosanna Randy who recommend that patient take an extra dose of Clonidine today and follow up in the morning for an office visit. Patient was advised and verbally voiced understanding. Appointment scheduled 10/17/2018 at 9:40am.

## 2018-10-17 ENCOUNTER — Other Ambulatory Visit: Payer: Self-pay

## 2018-10-17 ENCOUNTER — Ambulatory Visit: Payer: Self-pay | Admitting: Family Medicine

## 2018-10-17 DIAGNOSIS — G40909 Epilepsy, unspecified, not intractable, without status epilepticus: Secondary | ICD-10-CM

## 2018-10-17 DIAGNOSIS — I10 Essential (primary) hypertension: Secondary | ICD-10-CM

## 2018-10-17 MED ORDER — AMLODIPINE BESYLATE 10 MG PO TABS: 10.0000 mg | ORAL_TABLET | Freq: Every day | ORAL | 2 refills | Status: DC

## 2018-10-17 MED ORDER — LEVETIRACETAM 500 MG PO TABS: 500.0000 mg | ORAL_TABLET | Freq: Two times a day (BID) | ORAL | 3 refills | Status: DC

## 2018-10-17 MED ORDER — METOPROLOL SUCCINATE ER 100 MG PO TB24: 100.0000 mg | ORAL_TABLET | Freq: Every day | ORAL | 3 refills | Status: DC

## 2018-10-21 ENCOUNTER — Telehealth: Payer: Self-pay

## 2018-10-21 ENCOUNTER — Ambulatory Visit: Payer: Self-pay

## 2018-10-21 ENCOUNTER — Ambulatory Visit: Payer: Self-pay | Admitting: Pharmacist

## 2018-10-21 DIAGNOSIS — E118 Type 2 diabetes mellitus with unspecified complications: Secondary | ICD-10-CM

## 2018-10-21 DIAGNOSIS — I69993 Ataxia following unspecified cerebrovascular disease: Secondary | ICD-10-CM

## 2018-10-21 NOTE — Telephone Encounter (Signed)
Virtual Visit Pre-Appointment Phone Call  Steps For Call:  1. Confirm consent - "In the setting of the current Covid19 crisis, you are scheduled for a phone visit with your provider on November 05, 2020 at 9:30AM.  Just as we do with many in-office visits, in order for you to participate in this visit, we must obtain consent.  If you'd like, I can send this to your mychart (if signed up) or email for you to review.  Otherwise, I can obtain your verbal consent now.  All virtual visits are billed to your insurance company just like a normal visit would be.  By agreeing to a virtual visit, we'd like you to understand that the technology does not allow for your provider to perform an examination, and thus may limit your provider's ability to fully assess your condition.  Finally, though the technology is pretty good, we cannot assure that it will always work on either your or our end, and in the setting of a video visit, we may have to convert it to a phone-only visit.  In either situation, we cannot ensure that we have a secure connection.  Are you willing to proceed?"  2. Give patient instructions for WebEx download to smartphone as below if video visit  3. Advise patient to be prepared with any vital sign or heart rhythm information, their current medicines, and a piece of paper and pen handy for any instructions they may receive the day of their visit  4. Inform patient they will receive a phone call 15 minutes prior to their appointment time (may be from unknown caller ID) so they should be prepared to answer  5. Confirm that appointment type is correct in Epic appointment notes (video vs telephone)    TELEPHONE CALL NOTE  Susan House has been deemed a candidate for a follow-up tele-health visit to limit community exposure during the Covid-19 pandemic. I spoke with the patient via phone to ensure availability of phone/video source, confirm preferred email & phone number, and discuss  instructions and expectations.  I reminded Susan House to be prepared with any vital sign and/or heart rhythm information that could potentially be obtained via home monitoring, at the time of her visit. I reminded Susan House to expect a phone call at the time of her visit if her visit.  Did the patient verbally acknowledge consent to treatment? YES  Horton Finer 10/21/2018 12:18 PM   CONSENT FOR TELE-HEALTH VISIT - PLEASE REVIEW  I hereby voluntarily request, consent and authorize [facility] and its employed or contracted physicians, physician assistants, nurse practitioners or other licensed health care professionals (the Practitioner), to provide me with telemedicine health care services (the Services") as deemed necessary by the treating Practitioner. I acknowledge and consent to receive the Services by the Practitioner via telemedicine. I understand that the telemedicine visit will involve communicating with the Practitioner through live audiovisual communication technology and the disclosure of certain medical information by electronic transmission. I acknowledge that I have been given the opportunity to request an in-person assessment or other available alternative prior to the telemedicine visit and am voluntarily participating in the telemedicine visit.  I understand that I have the right to withhold or withdraw my consent to the use of telemedicine in the course of my care at any time, without affecting my right to future care or treatment, and that the Practitioner or I may terminate the telemedicine visit at any  time. I understand that I have the right to inspect all information obtained and/or recorded in the course of the telemedicine visit and may receive copies of available information for a reasonable fee.  I understand that some of the potential risks of receiving the Services via telemedicine include:   Delay or interruption in medical evaluation  due to technological equipment failure or disruption;  Information transmitted may not be sufficient (e.g. poor resolution of images) to allow for appropriate medical decision making by the Practitioner; and/or   In rare instances, security protocols could fail, causing a breach of personal health information.  Furthermore, I acknowledge that it is my responsibility to provide information about my medical history, conditions and care that is complete and accurate to the best of my ability. I acknowledge that Practitioner's advice, recommendations, and/or decision may be based on factors not within their control, such as incomplete or inaccurate data provided by me or distortions of diagnostic images or specimens that may result from electronic transmissions. I understand that the practice of medicine is not an exact science and that Practitioner makes no warranties or guarantees regarding treatment outcomes. I acknowledge that I will receive a copy of this consent concurrently upon execution via email to the email address I last provided but may also request a printed copy by calling the office of [place].    I understand that my insurance will be billed for this visit.   I have read or had this consent read to me.  I understand the contents of this consent, which adequately explains the benefits and risks of the Services being provided via telemedicine.   I have been provided ample opportunity to ask questions regarding this consent and the Services and have had my questions answered to my satisfaction.  I give my informed consent for the services to be provided through the use of telemedicine in my medical care  By participating in this telemedicine visit I agree to the above.

## 2018-10-21 NOTE — Patient Instructions (Signed)
Initial CCM office visit rescheduled for Monday, May 11 at 9 am. Please bring all medications and glucometer with you!    Thank you allowing the Chronic Care Management Team to be a part of your care!   Please call a member of the CCM (Chronic Care Management) Team with any questions or case management needs:   Vanetta Mulders, BSN Nurse Care Coordinator  [phone]  Ruben Reason, PharmD  Clinical Pharmacist  [phone]  Elliot Gurney, [place] Clinical Social Worker [phone]

## 2018-10-21 NOTE — Chronic Care Management (AMB) (Signed)
  Chronic Care Management   Note  10/21/2018 Name: Susan House MRN: [id number] DOB: 1955-08-29  Successful outreach to reschedule [name] Initial CCM office visit.   Visit scheduled for Monday, May 11 at 9 am. [name] instructed to bring all medications and glucometer with her to the appointment.  Follow up plan: Face to Face appointment with CCM team member scheduled foR:  May 11  Ruben Reason, PharmD Clinical Pharmacist [place] [phone]

## 2018-10-24 ENCOUNTER — Other Ambulatory Visit: Payer: Self-pay

## 2018-10-24 ENCOUNTER — Telehealth: Payer: Self-pay | Admitting: Family Medicine

## 2018-10-24 DIAGNOSIS — G40909 Epilepsy, unspecified, not intractable, without status epilepticus: Secondary | ICD-10-CM

## 2018-10-24 DIAGNOSIS — I10 Essential (primary) hypertension: Secondary | ICD-10-CM

## 2018-10-29 ENCOUNTER — Telehealth: Payer: Self-pay | Admitting: Adult Health

## 2018-10-29 NOTE — Telephone Encounter (Signed)
I called pt that her PCP sent a year of refills to [facility] in [place] for 90 days on 10/17/2018. I recommend she call their office to have them send to the mail order. I advise pt to pick up the rx that her PCP sent on 10/17/2018 to the local pharmacy. Pt verbalized understanding.

## 2018-10-29 NOTE — Telephone Encounter (Signed)
Pt wants 61mo supply sen to [facility] but also wants to know if 15 days worth of keppra can be sent to [place] on file

## 2018-10-29 NOTE — Telephone Encounter (Signed)
Pt called in for a refill of her levETIRAcetam (KEPPRA) 500 MG tablet to be sent to [place], [place]

## 2018-10-29 NOTE — Telephone Encounter (Signed)
Pt called saying the medication have not been sent in per [facility]  Pt's states she spoke with them today and does not understand why her meds have not been sent in  Pt's call [phone]  Thanks teri

## 2018-10-30 MED ORDER — METOPROLOL SUCCINATE ER 100 MG PO TB24: 100.0000 mg | ORAL_TABLET | Freq: Every day | ORAL | 3 refills | Status: DC

## 2018-10-30 MED ORDER — AMLODIPINE BESYLATE 10 MG PO TABS: 10.0000 mg | ORAL_TABLET | Freq: Every day | ORAL | 2 refills | Status: DC

## 2018-10-30 MED ORDER — CLONIDINE HCL 0.3 MG PO TABS: 0.3000 mg | ORAL_TABLET | Freq: Two times a day (BID) | ORAL | 3 refills | Status: DC

## 2018-10-30 MED ORDER — LEVETIRACETAM 500 MG PO TABS: 500.0000 mg | ORAL_TABLET | Freq: Two times a day (BID) | ORAL | 3 refills | Status: DC

## 2018-10-30 NOTE — Telephone Encounter (Signed)
Please review. Thanks!  

## 2018-11-06 ENCOUNTER — Telehealth: Payer: Medicare Other | Admitting: Nurse Practitioner

## 2018-11-07 ENCOUNTER — Telehealth: Payer: Self-pay | Admitting: Family Medicine

## 2018-11-07 NOTE — Telephone Encounter (Signed)
Pt needs refill on  Clopidogrel 75 mg one a day  [facility]  CB#  [phone]  Thanks Con Memos

## 2018-11-08 ENCOUNTER — Telehealth: Payer: Self-pay

## 2018-11-08 ENCOUNTER — Telehealth: Payer: Medicare Other | Admitting: Cardiovascular Disease

## 2018-11-08 ENCOUNTER — Other Ambulatory Visit: Payer: Self-pay

## 2018-11-08 ENCOUNTER — Telehealth: Payer: Self-pay | Admitting: Family Medicine

## 2018-11-08 NOTE — Telephone Encounter (Signed)
Pt called saying she is having nausea, abd pain, diarrhea, no fever  Pt states this started yesterday afternoon,  CB#  [phone]  Thanks Con Memos

## 2018-11-08 NOTE — Telephone Encounter (Signed)
Pt advised.   Thanks,   -Chitara Clonch  

## 2018-11-08 NOTE — Telephone Encounter (Signed)
May use Pepto-Bismol for diarrhea and Bonine (meclizine) for nausea. Change to bland diet with clear liquids. No milk, dairy products, caffeine or spicy foods. May drink Gatorade to replace fluids. If no better in 2-3 days, will need to consider ER visit to get IV rehydration and possible imaging tests. Go sooner if fever develops.

## 2018-11-08 NOTE — Telephone Encounter (Signed)
Spoke with patient.  Made her aware that Dr. Rockey Situ has a procedure at 11:30 and we will need to move her appointment to Tuesday.  Patient was agreeable.  Scheduled for 11/12/2018 @ 8:20am

## 2018-11-08 NOTE — Telephone Encounter (Signed)
Called patient.  No answer. No VM.  Need to obtain vitals.  Need to review medications

## 2018-11-08 NOTE — Telephone Encounter (Signed)
Offered pt appointment with you this afternoon, but she states she is not able to come in secondary to diarrhea.  Pt denies fevers, bloody or black tarry stool.      Thanks,   -Mickel Baas

## 2018-11-11 NOTE — Progress Notes (Signed)
Virtual Visit via Telephone Note   This visit type was conducted due to national recommendations for restrictions regarding the COVID-19 Pandemic (e.g. social distancing) in an effort to limit this patient's exposure and mitigate transmission in our community.  Due to her co-morbid illnesses, this patient is at least at moderate risk for complications without adequate follow up.  This format is felt to be most appropriate for this patient at this time.  The patient did not have access to video technology/had technical difficulties with video requiring transitioning to audio format only (telephone).  All issues noted in this document were discussed and addressed.  No physical exam could be performed with this format.  Please refer to the patient's chart for her  consent to telehealth for [facility].   I connected with  [name] on 11/11/18 by a video enabled telemedicine application and verified that I am speaking with the correct person using two identifiers. I discussed the limitations of evaluation and management by telemedicine. The patient expressed understanding and agreed to proceed.   Evaluation Performed:  Follow-up visit  Date:  11/11/2018   ID:  Susan, House 12/06/1955, MRN [id number]  Patient Location:  [address] [place] [address]   Provider location:   [facility], [place] office  PCP:  Alexis Goodell, MD  Cardiologist:  [facility]  Chief Complaint: Gait instability   History of Present Illness:    Susan House is a 63 y.o. female who presents via audio/video conferencing for a telehealth visit today.   The patient does not symptoms concerning for COVID-19 infection (fever, chills, cough, or new SHORTNESS OF BREATH).   Patient has a past medical history of stroke in 2000 followed by posterior circulatory TIA in June of 2010,  chronic dizziness in the past secondary to dehydration,  Chronic chest  pain restless leg syndrome, home type 2 diabetes,  She has residual left side weakness arm and leg from her stroke Carotid duplex showed 50-69% stenosis in the right internal carotid artery. <50% stenosis  Left CT scan chest 10/15/2016 showing diffuse three-vessel coronary disease who presents for routine follow-up of her stroke, chest pain symptoms.  Notes indicating recent urinary tract infection Klebsiella pneumonia  Admitted to the hospital August 30, 2018 with syncope secondary to hypoglycemia  fell into [place] developed significant bruising on the left side of her face. Presented to the hospital with headache, chest pain In the hospital had work-up for her chest pain Stress test September 26, 2018 Pharmacological myocardial perfusion imaging study with no significant  ischemia Normal wall motion, EF estimated at 71% No EKG changes concerning for ischemia at peak stress or in recovery. Low risk scan   Total chol 102 LDL 51 HBA1C 10.5 down from 12  headache better Left side weakness No more chest pain, no SOB NO swelling,  Blood pressure: good range   Other past medical history reviewed Admission September 2019 Left-sided weakness with TIA  possible seizure episode on 10/15/2016. Seen by neurology, MRI showing no stroke Question of seizure   emergency room 11/22/2016 for chest pain radiating through to her back centralized chest pain  CT scan chest 10/15/2016 showing diffuse three-vessel coronary disease  Hospitalization December 2017 for weakness, dehydration, possible TIA, GI bleed, diarrhea Off plavix Had colo as an outpatient, was told that she had "ulcerations"    In hospital 07/07/16, possible TIA Could not hear in both the  ears ,  felt her speech was slurred. CT scan of the head did not show any acute intracranial abnormality except for chronic white matter changes  Echo: No cardiac source of emboli was identified, EF 60-65%.  MRA HEAD Flow  noted within the distal vertebral arteries and basilar artery. significant atherosclerotic changes of the posterior cerebral arteries and superior cerebral arteries greater on the right.  cardiac workup numerous echocardiograms dating back to 2008,  cardiac catheterization. Results of the latter is not available at this time  chest pain early December 2015. She was seen in the emergency room  echocardiogram July 2015 showing normal ejection fraction  Prior carotid ultrasound 2008 showing mild to moderate carotid arterial disease. Stress test July 2011 showing no ischemia.  Prior CV studies:   The following studies were reviewed today: Echo 03/2018 Left ventricle: The cavity size was normal. There was moderate concentric hypertrophy. Systolic function was normal. The estimated ejection fraction was in the range of 55% to 60%. Wall   motion was normal; there were no regional wall motion   abnormalities. Doppler parameters are consistent with abnormal   left ventricular relaxation (grade 1 diastolic dysfunction). - Mitral valve: Calcified annulus. - Left atrium: The atrium was moderately dilated. - Pulmonary arteries: Systolic pressure could not be accurately   estimated.  Past Medical History:  Diagnosis Date  . Allergy   . Arthritis   . Atypical chest pain    a. Reportedly nl cath in the past; b. 12/2009 MV: No ischemia/infarct. EF 87%.  . Carotid arterial disease ([place])    a. 10/350 CTA head/neck: LICA 48L, RICA 85-->TMB rx.  . Cellulitis and abscess of face   . Chronic Dizziness   . Coronary artery calcification seen on CT scan    a. 10/2016 CTA chest: Atherosclerotic calcifications Ao, cors, and prox great vessels.  . Depression   . Diabetes mellitus ([place])   . Diastolic dysfunction    a. 2008 Echo: Nl EF; b. 2011 Echo: Nl EF; c. 06/2016 Echo: EF 55-60%, Gr1DD; d. 03/2018 Echo: EF 55-60%, no rwma, Gr1DD, mod dil LA.  . Edema   . GERD (gastroesophageal reflux disease)   .  Headache   . Hematuria   . Hyperlipidemia   . Hypertension   . IBS (irritable bowel syndrome)   . Migraine   . Morbid obesity ([place])   . Nocturia   . Possible Seizure ([place])    a. 10/2016 ? sz. MRI neg for stroke.  . Stroke ([place]) 2000  . TIA (transient ischemic attack) 2010 & 2019   a. 2010; b. 03/2018 Left sided wkns-->MRI neg for stroke. CTA head/neck: Right A2 cut off/occlusion w/ collats extending over R cerebral convexity. LICA 31P, RICA 45, RSubclavian 50->med rx.  . Urgency of micturation   . Urinary frequency   . Urinary incontinence    Past Surgical History:  Procedure Laterality Date  . ABDOMINAL HYSTERECTOMY    . ABDOMINOPLASTY    . APPENDECTOMY    . BACK SURGERY     extensive spinal fusion  . carpel tunnel surgery    . CATARACT EXTRACTION     x 2  . CESAREAN SECTION    . CHOLECYSTECTOMY    . COLONOSCOPY  2010  . COLONOSCOPY WITH PROPOFOL N/A 07/11/2016   Procedure: COLONOSCOPY WITH PROPOFOL;  Surgeon: Lucilla Lame, MD;  Location: [facility] ENDOSCOPY;  Service: Endoscopy;  Laterality: N/A;  . COLONOSCOPY WITH PROPOFOL N/A 03/26/2018   Procedure: COLONOSCOPY WITH PROPOFOL;  Surgeon: Lucilla Lame, MD;  Location[facility] ENDOSCOPY;  Service: Endoscopy;  Laterality: N/A;  . ESOPHAGOGASTRODUODENOSCOPY (EGD) WITH PROPOFOL N/A 03/26/2018   Procedure: ESOPHAGOGASTRODUODENOSCOPY (EGD) WITH PROPOFOL;  Surgeon: Lucilla Lame, MD;  Location: [facility] ENDOSCOPY;  Service: Endoscopy;  Laterality: N/A;  . EYE SURGERY    . HERNIA REPAIR    . SHOULDER SURGERY    . TOE SURGERY    . UPPER GI ENDOSCOPY    . WRIST FRACTURE SURGERY Left      No outpatient medications have been marked as taking for the 11/12/18 encounter (Appointment) with Minna Merritts, MD.     Allergies:   Morphine and related; Fentanyl; Reglan [metoclopramide]; Simvastatin; Betadine [povidone iodine]; Iodine; and Tetracyclines & related   Social History   Tobacco Use  . Smoking status: Never Smoker  . Smokeless tobacco:  Never Used  Substance Use Topics  . Alcohol use: No  . Drug use: No     Current Outpatient Medications on File Prior to Visit  Medication Sig Dispense Refill  . ACCU-CHEK SOFTCLIX LANCETS lancets CHECK BLOOD SUGAR THREE TIMES DAILY 300 each 3  . amLODipine (NORVASC) 10 MG tablet Take 1 tablet (10 mg total) by mouth daily. 90 tablet 2  . aspirin 81 MG chewable tablet Chew 1 tablet (81 mg total) by mouth daily.    Marland Kitchen atorvastatin (LIPITOR) 20 MG tablet Take 1 tablet (20 mg total) by mouth daily. 90 tablet 3  . Blood Glucose Monitoring Suppl (ACCU-CHEK AVIVA PLUS) w/Device KIT Check sugar three times daily DX E11.9-needs a meter 1 kit 0  . cephALEXin (KEFLEX) 500 MG capsule Take 1 capsule (500 mg total) by mouth 2 (two) times daily. 14 capsule 0  . cloNIDine (CATAPRES) 0.3 MG tablet Take 1 tablet (0.3 mg total) by mouth 2 (two) times daily. 180 tablet 3  . Continuous Blood Gluc Receiver (FREESTYLE LIBRE 14 DAY READER) DEVI LENGTH OF NEED: LIFETIME - UNLESS SPECIFIED OTHERWISE 2 Device 11  . Continuous Blood Gluc Sensor (FREESTYLE LIBRE 14 DAY SENSOR) MISC LENGTH OF NEED: LIFETIME - UNLESS SPECIFIED OTHERWISE 2 each 0  . enalapril (VASOTEC) 20 MG tablet Take 1 tablet (20 mg total) by mouth 2 (two) times daily. 180 tablet 2  . glucose blood (ACCU-CHEK AVIVA PLUS) test strip TEST BLOOD SUGAR THREE TIMES DAILY 300 each 3  . hydrALAZINE (APRESOLINE) 100 MG tablet Take 1 tablet (100 mg total) by mouth 2 (two) times daily. 60 tablet 11  . insulin degludec (TRESIBA FLEXTOUCH) 100 UNIT/ML SOPN FlexTouch Pen Up to 30 units daily. (Patient taking differently: Inject 20 Units into the skin 2 (two) times daily. ) 3 pen 11  . Lancet Devices (ACCU-CHEK SOFTCLIX) lancets Check sugar three times daily DX E11.9 100 each 3  . levETIRAcetam (KEPPRA) 500 MG tablet Take 1 tablet (500 mg total) by mouth 2 (two) times daily. 500 mg bid. 180 tablet 3  . metFORMIN (GLUCOPHAGE) 1000 MG tablet Take 1 tablet (1,000 mg total)  by mouth 2 (two) times daily. 180 tablet 3  . metoprolol succinate (TOPROL-XL) 100 MG 24 hr tablet Take 1 tablet (100 mg total) by mouth daily. 90 tablet 3  . mupirocin cream (BACTROBAN) 2 % Apply 1 application topically daily.    . nitroGLYCERIN (NITROSTAT) 0.4 MG SL tablet Place 1 tablet (0.4 mg total) under the tongue every 5 (five) minutes as needed for chest pain. 25 tablet 0  . oxybutynin (DITROPAN) 5 MG tablet Take 1 tablet (5 mg total)  by mouth 2 (two) times daily. 180 tablet 3  . pantoprazole (PROTONIX) 40 MG tablet Take 1 tablet (40 mg total) by mouth daily. 90 tablet 2  . traMADol-acetaminophen (ULTRACET) 37.5-325 MG tablet Take 1 tablet by mouth every 6 (six) hours as needed. 30 tablet 0   No current facility-administered medications on file prior to visit.      Family Hx: The patient's family history includes Arrhythmia in her mother; Coronary artery disease in her father; Depression in her sister and sister; Diabetes in her father, sister, and sister; Heart attack (age of onset: 64) in her father; Heart disease in her father and mother; Hyperlipidemia in her father, mother, sister, and sister; Hypertension in her father, mother, sister, and sister; Kidney disease in her paternal grandmother; Rheum arthritis in her mother; Stroke in her father.  ROS:   Please see the history of present illness.    Review of Systems  Constitutional: Negative.   Respiratory: Negative.   Cardiovascular: Negative.   Gastrointestinal: Negative.   Musculoskeletal: Negative.        Gait instability  Neurological: Negative.   Psychiatric/Behavioral: Negative.   All other systems reviewed and are negative.     Labs/Other Tests and Data Reviewed:    Recent Labs: 12/25/2017: TSH 1.320 06/26/2018: ALT 16 09/25/2018: Hemoglobin 12.9; Platelets 280 09/26/2018: BUN 15; Creatinine, Ser 1.15; Magnesium 2.1; Potassium 4.1; Sodium 141   Recent Lipid Panel Lab Results  Component Value Date/Time   CHOL  102 04/01/2018 04:58 AM   CHOL 110 01/18/2014 12:59 AM   TRIG 198 (H) 04/01/2018 04:58 AM   TRIG 122 01/18/2014 12:59 AM   HDL 29 (L) 04/01/2018 04:58 AM   HDL 35 (L) 01/18/2014 12:59 AM   CHOLHDL 3.5 04/01/2018 04:58 AM   LDLCALC 33 04/01/2018 04:58 AM   LDLCALC 51 01/18/2014 12:59 AM    Wt Readings from Last 3 Encounters:  09/25/18 233 lb 14.4 oz (106.1 kg)  09/21/18 238 lb 1.6 oz (108 kg)  09/20/18 239 lb (108.4 kg)     Exam:    Vital Signs: Vital signs may also be detailed in the HPI There were no vitals taken for this visit.  Wt Readings from Last 3 Encounters:  09/25/18 233 lb 14.4 oz (106.1 kg)  09/21/18 238 lb 1.6 oz (108 kg)  09/20/18 239 lb (108.4 kg)   Temp Readings from Last 3 Encounters:  09/26/18 98.5 F (36.9 C) (Oral)  09/23/18 97.6 F (36.4 C) (Oral)  09/21/18 98.2 F (36.8 C) (Oral)   BP Readings from Last 3 Encounters:  09/26/18 (!) 138/50  09/23/18 124/74  09/21/18 (!) 165/67   Pulse Readings from Last 3 Encounters:  09/26/18 60  09/23/18 (!) 59  09/21/18 (!) 4     Well nourished, well developed female in no acute distress. Constitutional:  oriented to person, place, and time. No distress.     ASSESSMENT & PLAN:    Chronic chest pain Dating back over 10 years, prior catheterizations stress test echocardiograms all unrevealing No further testing at this time With that said she does have coronary calcification seen on CT scan Stressed the importance of aggressive diabetes control  Peripheral artery disease ([place]) Carotid disease, will need periodic carotid ultrasound Followed by vascular surgery  Cerebrovascular accident (CVA) due to other mechanism [facility]) Prior history of stroke, mechanism unclear, previously seen by neurology  Type 2 diabetes mellitus with complication, with long-term current use of insulin ([place]) Poorly controlled diabetes We have encouraged  continued exercise, careful diet management in an effort to lose weight.   Bilateral carotid artery stenosis As above, periodic carotid ultrasound Done through vascular surgery, last in December 2019  Mixed hyperlipidemia Cholesterol is at goal on the current lipid regimen. No changes to the medications were made.  Essential hypertension Blood pressure is well controlled on today's visit. No changes made to the medications.   COVID-19 Education: The signs and symptoms of COVID-19 were discussed with the patient and how to seek care for testing (follow up with PCP or arrange E-visit).  The importance of social distancing was discussed today.  Patient Risk:   After full review of this patients clinical status, I feel that they are at least moderate risk at this time.  Time:   Today, I have spent 25 minutes with the patient with telehealth technology discussing the cardiac and medical problems/diagnoses detailed above   10 min spent reviewing the chart prior to patient visit today   Medication Adjustments/Labs and Tests Ordered: Current medicines are reviewed at length with the patient today.  Concerns regarding medicines are outlined above.   Tests Ordered: No tests ordered   Medication Changes: No changes made   Disposition: Follow-up in 12 months   Signed, Ida Rogue, MD  11/11/2018 5:37 PM    [place] Office [address] [place] [address], [place], [place] [address]

## 2018-11-11 NOTE — Telephone Encounter (Signed)
yes

## 2018-11-11 NOTE — Telephone Encounter (Signed)
I do not see plavix on med list. Ok to refill? Please advise. Thanks!

## 2018-11-12 ENCOUNTER — Other Ambulatory Visit: Payer: Self-pay

## 2018-11-12 ENCOUNTER — Telehealth (INDEPENDENT_AMBULATORY_CARE_PROVIDER_SITE_OTHER): Payer: Medicare Other | Admitting: Cardiovascular Disease

## 2018-11-12 DIAGNOSIS — E782 Mixed hyperlipidemia: Secondary | ICD-10-CM | POA: Diagnosis not present

## 2018-11-12 DIAGNOSIS — I10 Essential (primary) hypertension: Secondary | ICD-10-CM | POA: Diagnosis not present

## 2018-11-12 DIAGNOSIS — I6389 Other cerebral infarction: Secondary | ICD-10-CM

## 2018-11-12 DIAGNOSIS — I6523 Occlusion and stenosis of bilateral carotid arteries: Secondary | ICD-10-CM | POA: Diagnosis not present

## 2018-11-12 DIAGNOSIS — I739 Peripheral vascular disease, unspecified: Secondary | ICD-10-CM

## 2018-11-12 DIAGNOSIS — Z794 Long term (current) use of insulin: Secondary | ICD-10-CM

## 2018-11-12 DIAGNOSIS — E118 Type 2 diabetes mellitus with unspecified complications: Secondary | ICD-10-CM

## 2018-11-12 MED ORDER — CLOPIDOGREL BISULFATE 75 MG PO TABS: 75.0000 mg | ORAL_TABLET | Freq: Every day | ORAL | 3 refills | Status: DC

## 2018-11-12 NOTE — Patient Instructions (Addendum)
Please see below for number to call for primary care provider.    Medication Instructions:  No changes  If you need a refill on your cardiac medications before your next appointment, please call your pharmacy.    Lab work: No new labs needed   If you have labs (blood work) drawn today and your tests are completely normal, you will receive your results only by: Marland Kitchen MyChart Message (if you have MyChart) OR . A paper copy in the mail If you have any lab test that is abnormal or we need to change your treatment, we will call you to review the results.   Testing/Procedures: No new testing needed   Follow-Up: At [facility], you and your health needs are our priority.  As part of our continuing mission to provide you with exceptional heart care, we have created designated Provider Care Teams.  These Care Teams include your primary Cardiologist (physician) and Advanced Practice Providers (APPs -  Physician Assistants and Nurse Practitioners) who all work together to provide you with the care you need, when you need it.  . You will need a follow up appointment in 12 months .   Please call our office 2 months in advance to schedule this appointment.    . Providers on your designated Care Team:   . Murray Hodgkins, NP . Christell Faith, PA-C . Marrianne Mood, PA-C  Any Other Special Instructions Will Be Listed Below (If Applicable).  For educational health videos Log in to : [email or web address] Or : [email or web address], password : [email or web address]

## 2018-11-12 NOTE — Telephone Encounter (Signed)
Sent in plavix into mail order pharmacy.

## 2018-11-20 ENCOUNTER — Ambulatory Visit (INDEPENDENT_AMBULATORY_CARE_PROVIDER_SITE_OTHER): Payer: Medicare Other

## 2018-11-20 DIAGNOSIS — I10 Essential (primary) hypertension: Secondary | ICD-10-CM | POA: Diagnosis not present

## 2018-11-20 DIAGNOSIS — E118 Type 2 diabetes mellitus with unspecified complications: Secondary | ICD-10-CM | POA: Diagnosis not present

## 2018-11-20 DIAGNOSIS — I69993 Ataxia following unspecified cerebrovascular disease: Secondary | ICD-10-CM

## 2018-11-20 NOTE — Chronic Care Management (AMB) (Signed)
   Chronic Care Management   Long Term RN Case Manager Follow Up Note  11/20/2018 Name: Lashauna Arpin MRN: [id number] DOB: [birth year]  Previously referred by PCP, Dr.Gilbert for Disease Management Educational Needs and Care Coordination.  Assessment:  Ms. Doranne Schmutz. Aris [place], 63 year old female patient who was referred to [facility] Team 08/28/2018 during office visit with husband. Multiple attempts were made for patient to connect with CCM RN CM and [facility] Pharmacist prior to COVID-19 restrictions, however appointments were cancelled by patient.  [name] is scheduled for an in office face to face appointment with CCM Team 11/25/2018 however due to CCM Team working remotely, patient is encouraged to change face to face appointment to telephone visit.  Patient declines stating she is doing well. She wishes to cancel appointment with CCM Team until she can meet in person. States her sugars are more stable but she has not engaged with an endocrinology as advised.  Review of patient status, including review of consultants reports, relevant laboratory and other test results, and collaboration with appropriate care team members and the patient's provider was performed as part of comprehensive patient evaluation and provision of chronic care management services.    Objective:  Lab Results  Component Value Date   HGBA1C 10.5 (H) 08/30/2018   HGBA1C 12.0 (A) 07/29/2018   HGBA1C 9.5 (H) 04/01/2018   Lab Results  Component Value Date   LDLCALC 33 04/01/2018   CREATININE 1.15 (H) 09/26/2018    3 ED visits and 2 Hospitalizations in the last 6 months  Changes in SDOH (Social Determinants of Health) since last assessment:   Medications Reviewed: No: Patient declined  Does patient express difficulty paying for or obtaining medications: No  Date of last attended PCP appointment: 09/23/2018  Date of last attended Annual Wellness Visit: 01/2017  Date of last specialist appointments:  11/12/2018 Cardiology  Care Gaps Noted: No Annual Wellness Visit (AWV) scheduled. Will request appointment once COVID-19 restrictions are lifted  Does patient exhibit evidence of need for re-establishment of more frequent engagement in CCM services?: Yes: If yes, what services are needed? Pharmacist for Disease Management Educational Needs Medication Management and Education Medication Reconciliation  Plan:Will continue to follow patient remotely through EMR and intervene in patients care as necessary. Will reschedule face to face appointment one CCM Team is no longer working remotely.  Jahayra Mazo E. Rollene Rotunda, RN, BSN Nurse Care Coordinator [facility] Care Management [phone]

## 2018-11-21 ENCOUNTER — Telehealth: Payer: Self-pay | Admitting: Family Medicine

## 2018-11-21 MED ORDER — GLUCOSE BLOOD VI STRP: ORAL_STRIP | 3 refills | Status: DC

## 2018-11-21 NOTE — Telephone Encounter (Signed)
Pt needs refill on  Accu Chek Aviva Plus  Test strips  3 months supply  [facility]  Thanks Con Memos

## 2018-11-25 ENCOUNTER — Ambulatory Visit: Payer: Self-pay

## 2018-11-29 ENCOUNTER — Encounter: Payer: Self-pay | Admitting: Emergency Medicine

## 2018-11-29 ENCOUNTER — Emergency Department: Payer: Medicare Other

## 2018-11-29 ENCOUNTER — Emergency Department
Admission: EM | Admit: 2018-11-29 | Discharge: 2018-11-29 | Disposition: A | Discharge status: Home / Self Care | Payer: Medicare Other | Attending: Emergency Medicine | Admitting: Emergency Medicine

## 2018-11-29 DIAGNOSIS — E1122 Type 2 diabetes mellitus with diabetic chronic kidney disease: Secondary | ICD-10-CM | POA: Diagnosis not present

## 2018-11-29 DIAGNOSIS — I313 Pericardial effusion (noninflammatory): Secondary | ICD-10-CM

## 2018-11-29 DIAGNOSIS — Z794 Long term (current) use of insulin: Secondary | ICD-10-CM | POA: Insufficient documentation

## 2018-11-29 DIAGNOSIS — Z7901 Long term (current) use of anticoagulants: Secondary | ICD-10-CM | POA: Diagnosis not present

## 2018-11-29 DIAGNOSIS — Z79899 Other long term (current) drug therapy: Secondary | ICD-10-CM | POA: Insufficient documentation

## 2018-11-29 DIAGNOSIS — I69959 Hemiplegia and hemiparesis following unspecified cerebrovascular disease affecting unspecified side: Secondary | ICD-10-CM | POA: Diagnosis not present

## 2018-11-29 DIAGNOSIS — E11319 Type 2 diabetes mellitus with unspecified diabetic retinopathy without macular edema: Secondary | ICD-10-CM | POA: Insufficient documentation

## 2018-11-29 DIAGNOSIS — Z7982 Long term (current) use of aspirin: Secondary | ICD-10-CM | POA: Insufficient documentation

## 2018-11-29 DIAGNOSIS — R079 Chest pain, unspecified: Secondary | ICD-10-CM

## 2018-11-29 DIAGNOSIS — R069 Unspecified abnormalities of breathing: Secondary | ICD-10-CM | POA: Diagnosis not present

## 2018-11-29 DIAGNOSIS — I129 Hypertensive chronic kidney disease with stage 1 through stage 4 chronic kidney disease, or unspecified chronic kidney disease: Secondary | ICD-10-CM | POA: Diagnosis not present

## 2018-11-29 DIAGNOSIS — E039 Hypothyroidism, unspecified: Secondary | ICD-10-CM | POA: Insufficient documentation

## 2018-11-29 DIAGNOSIS — I3139 Other pericardial effusion (noninflammatory): Secondary | ICD-10-CM

## 2018-11-29 DIAGNOSIS — Z20828 Contact with and (suspected) exposure to other viral communicable diseases: Secondary | ICD-10-CM | POA: Diagnosis not present

## 2018-11-29 DIAGNOSIS — R0602 Shortness of breath: Secondary | ICD-10-CM | POA: Diagnosis not present

## 2018-11-29 DIAGNOSIS — R0689 Other abnormalities of breathing: Secondary | ICD-10-CM | POA: Diagnosis not present

## 2018-11-29 DIAGNOSIS — N183 Chronic kidney disease, stage 3 (moderate): Secondary | ICD-10-CM | POA: Diagnosis not present

## 2018-11-29 DIAGNOSIS — I708 Atherosclerosis of other arteries: Secondary | ICD-10-CM | POA: Diagnosis not present

## 2018-11-29 DIAGNOSIS — I10 Essential (primary) hypertension: Secondary | ICD-10-CM | POA: Diagnosis not present

## 2018-11-29 DIAGNOSIS — I2510 Atherosclerotic heart disease of native coronary artery without angina pectoris: Secondary | ICD-10-CM | POA: Insufficient documentation

## 2018-11-29 DIAGNOSIS — E785 Hyperlipidemia, unspecified: Secondary | ICD-10-CM | POA: Insufficient documentation

## 2018-11-29 DIAGNOSIS — I772 Rupture of artery: Secondary | ICD-10-CM | POA: Diagnosis not present

## 2018-11-29 LAB — CBC WITH DIFFERENTIAL/PLATELET
Abs Immature Granulocytes: 0.04 10*3/uL (ref 0.00–0.07)
Basophils Absolute: 0 10*3/uL (ref 0.0–0.1)
Basophils Relative: 0 %
Eosinophils Absolute: 0.2 10*3/uL (ref 0.0–0.5)
Eosinophils Relative: 2 %
HCT: 35 % — ABNORMAL LOW (ref 36.0–46.0)
Hemoglobin: 11.8 g/dL — ABNORMAL LOW (ref 12.0–15.0)
Immature Granulocytes: 0 %
Lymphocytes Relative: 18 %
Lymphs Abs: 1.8 10*3/uL (ref 0.7–4.0)
MCH: 28.4 pg (ref 26.0–34.0)
MCHC: 33.7 g/dL (ref 30.0–36.0)
MCV: 84.3 fL (ref 80.0–100.0)
Monocytes Absolute: 0.9 10*3/uL (ref 0.1–1.0)
Monocytes Relative: 9 %
Neutro Abs: 6.9 10*3/uL (ref 1.7–7.7)
Neutrophils Relative %: 71 %
Platelets: 218 10*3/uL (ref 150–400)
RBC: 4.15 MIL/uL (ref 3.87–5.11)
RDW: 13.3 % (ref 11.5–15.5)
WBC: 9.8 10*3/uL (ref 4.0–10.5)
nRBC: 0 % (ref 0.0–0.2)

## 2018-11-29 LAB — COMPREHENSIVE METABOLIC PANEL
ALT: 42 U/L (ref 0–44)
AST: 61 U/L — ABNORMAL HIGH (ref 15–41)
Albumin: 3.8 g/dL (ref 3.5–5.0)
Alkaline Phosphatase: 86 U/L (ref 38–126)
Anion gap: 12 (ref 5–15)
BUN: 25 mg/dL — ABNORMAL HIGH (ref 8–23)
CO2: 22 mmol/L (ref 22–32)
Calcium: 9 mg/dL (ref 8.9–10.3)
Chloride: 106 mmol/L (ref 98–111)
Creatinine, Ser: 1.03 mg/dL — ABNORMAL HIGH (ref 0.44–1.00)
GFR calc Af Amer: >60 mL/min (ref >60)
GFR calc non Af Amer: 58 mL/min — ABNORMAL LOW (ref >60)
Glucose, Bld: 151 mg/dL — ABNORMAL HIGH (ref 70–99)
Potassium: 4.2 mmol/L (ref 3.5–5.1)
Sodium: 140 mmol/L (ref 135–145)
Total Bilirubin: 0.8 mg/dL (ref 0.3–1.2)
Total Protein: 7.3 g/dL (ref 6.5–8.1)

## 2018-11-29 LAB — TROPONIN I: Troponin I: <0.03 ng/mL (ref <0.03)

## 2018-11-29 LAB — BRAIN NATRIURETIC PEPTIDE: B Natriuretic Peptide: 131 pg/mL — ABNORMAL HIGH (ref 0.0–100.0)

## 2018-11-29 LAB — FIBRIN DERIVATIVES D-DIMER (ARMC ONLY): Fibrin derivatives D-dimer (ARMC): 1339.24 ng/mL (FEU) — ABNORMAL HIGH (ref 0.00–499.00)

## 2018-11-29 LAB — SARS CORONAVIRUS 2 BY RT PCR (HOSPITAL ORDER, PERFORMED IN ~~LOC~~ HOSPITAL LAB): SARS Coronavirus 2: NEGATIVE

## 2018-11-29 MED ORDER — IOHEXOL 350 MG/ML SOLN
75.0000 mL | Freq: Once | INTRAVENOUS | Status: AC | PRN
  Administered 2018-11-29: 75 mL via INTRAVENOUS
  Filled 2018-11-29: qty 75

## 2018-11-29 MED ORDER — METHYLPREDNISOLONE SODIUM SUCC 125 MG IJ SOLR
125.0000 mg | Freq: Once | INTRAMUSCULAR | Status: DC
  Filled 2018-11-29: qty 2

## 2018-11-29 MED ORDER — PREDNISONE 50 MG PO TABS: 50.0000 mg | ORAL_TABLET | Freq: Every day | ORAL | 0 refills | Status: DC

## 2018-11-29 MED ORDER — IPRATROPIUM-ALBUTEROL 0.5-2.5 (3) MG/3ML IN SOLN
6.0000 mL | Freq: Once | RESPIRATORY_TRACT | Status: AC
  Administered 2018-11-29: 20:00:00 6 mL via RESPIRATORY_TRACT
  Filled 2018-11-29: qty 6

## 2018-11-29 NOTE — ED Triage Notes (Signed)
Arrives from home via [facility].  EMS reports that patient felt SOB about 20 minutes prior to arrival.  Took 4 puffs on inhaler with no relief.  States has history of Asthma, but this episode does not feel the same as an asthma attack.  Patient is AAOx3.  Skin warm and dry. NAD
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
## 2018-11-29 NOTE — ED Notes (Signed)
Patient transported to CT 

## 2018-12-03 ENCOUNTER — Ambulatory Visit (INDEPENDENT_AMBULATORY_CARE_PROVIDER_SITE_OTHER): Payer: Medicare Other

## 2018-12-03 ENCOUNTER — Encounter (INDEPENDENT_AMBULATORY_CARE_PROVIDER_SITE_OTHER): Payer: Self-pay | Admitting: Nurse Practitioner

## 2018-12-03 ENCOUNTER — Other Ambulatory Visit: Payer: Self-pay

## 2018-12-03 ENCOUNTER — Ambulatory Visit (INDEPENDENT_AMBULATORY_CARE_PROVIDER_SITE_OTHER): Payer: Medicare Other | Admitting: Nurse Practitioner

## 2018-12-03 VITALS — BP 108/57 | HR 56 | Resp 10 | Ht 61.0 in | Wt 225.0 lb

## 2018-12-03 DIAGNOSIS — Z79899 Other long term (current) drug therapy: Secondary | ICD-10-CM

## 2018-12-03 DIAGNOSIS — E782 Mixed hyperlipidemia: Secondary | ICD-10-CM | POA: Diagnosis not present

## 2018-12-03 DIAGNOSIS — I10 Essential (primary) hypertension: Secondary | ICD-10-CM | POA: Diagnosis not present

## 2018-12-03 DIAGNOSIS — I6523 Occlusion and stenosis of bilateral carotid arteries: Secondary | ICD-10-CM

## 2018-12-03 DIAGNOSIS — E118 Type 2 diabetes mellitus with unspecified complications: Secondary | ICD-10-CM

## 2018-12-03 DIAGNOSIS — Z7984 Long term (current) use of oral hypoglycemic drugs: Secondary | ICD-10-CM

## 2018-12-03 DIAGNOSIS — Z7902 Long term (current) use of antithrombotics/antiplatelets: Secondary | ICD-10-CM | POA: Diagnosis not present

## 2018-12-03 NOTE — Progress Notes (Signed)
SUBJECTIVE:  Patient ID: Susan House, female    DOB: [birth year], 63 y.o.   MRN: [id number] Chief Complaint  Patient presents with  . Follow-up    HPI  Susan House is a 63 y.o. female that presents today after a subsequent CT scan showed a possible left subclavian artery stenosis.  The patient presented to the hospital with shortness of breath and she was being ruled out for PE.  The patient denies any arm pain.  She denies any numbness or tingling of her left upper extremity.  The patient subsequently underwent a carotid artery duplex which revealed stenosis of 40 to 59% in the right carotid artery with 1 to 39% within the left internal carotid artery.  There were no increased velocities were abnormal waveforms suggesting proximal stenosis of the subclavian artery.  The patient also had blood pressures done on her bilateral arms which were within 5 mmHg of each other.  CT scan was independently reviewed by myself and Dr. Lucky Cowboy, and based on the scan there was no evidence of significant stenosis.  Past Medical History:  Diagnosis Date  . Allergy   . Arthritis   . Atypical chest pain    a. Reportedly nl cath in the past; b. 12/2009 MV: No ischemia/infarct. EF 87%.  . Carotid arterial disease ([place])    a. 09/2547 CTA head/neck: LICA 82M, RICA 41-->RAX rx.  . Cellulitis and abscess of face   . Chronic Dizziness   . Coronary artery calcification seen on CT scan    a. 10/2016 CTA chest: Atherosclerotic calcifications Ao, cors, and prox great vessels.  . Depression   . Diabetes mellitus ([place])   . Diastolic dysfunction    a. 2008 Echo: Nl EF; b. 2011 Echo: Nl EF; c. 06/2016 Echo: EF 55-60%, Gr1DD; d. 03/2018 Echo: EF 55-60%, no rwma, Gr1DD, mod dil LA.  . Edema   . GERD (gastroesophageal reflux disease)   . Headache   . Hematuria   . Hyperlipidemia   . Hypertension   . IBS (irritable bowel syndrome)   . Migraine   . Morbid obesity ([place])   . Nocturia   . Possible Seizure  ([place])    a. 10/2016 ? sz. MRI neg for stroke.  . Stroke ([place]) 2000  . TIA (transient ischemic attack) 2010 & 2019   a. 2010; b. 03/2018 Left sided wkns-->MRI neg for stroke. CTA head/neck: Right A2 cut off/occlusion w/ collats extending over R cerebral convexity. LICA 09M, RICA 45, RSubclavian 50->med rx.  . Urgency of micturation   . Urinary frequency   . Urinary incontinence     Past Surgical History:  Procedure Laterality Date  . ABDOMINAL HYSTERECTOMY    . ABDOMINOPLASTY    . APPENDECTOMY    . BACK SURGERY     extensive spinal fusion  . carpel tunnel surgery    . CATARACT EXTRACTION     x 2  . CESAREAN SECTION    . CHOLECYSTECTOMY    . COLONOSCOPY  2010  . COLONOSCOPY WITH PROPOFOL N/A 07/11/2016   Procedure: COLONOSCOPY WITH PROPOFOL;  Surgeon: Lucilla Lame, MD;  Location: [facility] ENDOSCOPY;  Service: Endoscopy;  Laterality: N/A;  . COLONOSCOPY WITH PROPOFOL N/A 03/26/2018   Procedure: COLONOSCOPY WITH PROPOFOL;  Surgeon: Lucilla Lame, MD;  Location: [facility] ENDOSCOPY;  Service: Endoscopy;  Laterality: N/A;  . ESOPHAGOGASTRODUODENOSCOPY (EGD) WITH PROPOFOL N/A 03/26/2018   Procedure: ESOPHAGOGASTRODUODENOSCOPY (EGD) WITH PROPOFOL;  Surgeon: Lucilla Lame, MD;  Location: [facility] ENDOSCOPY;  Service: Endoscopy;  Laterality: N/A;  . EYE SURGERY    . HERNIA REPAIR    . SHOULDER SURGERY    . TOE SURGERY    . UPPER GI ENDOSCOPY    . WRIST FRACTURE SURGERY Left     Social History   Socioeconomic History  . Marital status: Married    Spouse name: Marcello Moores   . Number of children: 2  . Years of education: 73  . Highest education level: Not on file  Occupational History  . Occupation: Disabled   . Occupation: retired  Scientific laboratory technician  . Financial resource strain: Somewhat hard  . Food insecurity:    Worry: Patient refused    Inability: Patient refused  . Transportation needs:    Medical: No    Non-medical: No  Tobacco Use  . Smoking status: Never Smoker  . Smokeless tobacco: Never  Used  Substance and Sexual Activity  . Alcohol use: No  . Drug use: No  . Sexual activity: Never  Lifestyle  . Physical activity:    Days per week: Patient refused    Minutes per session: Patient refused  . Stress: Only a little  Relationships  . Social connections:    Talks on phone: More than three times a week    Gets together: More than three times a week    Attends religious service: More than 4 times per year    Active member of club or organization: Patient refused    Attends meetings of clubs or organizations: 1 to 4 times per year    Relationship status: Married  . Intimate partner violence:    Fear of current or ex partner: No    Emotionally abused: No    Physically abused: No    Forced sexual activity: No  Other Topics Concern  . Not on file  Social History Narrative   Patient lives in [place] at home with husband Marcello Moores.    Patient has 2 children and 2 step children.    Patient has 12+ years of education.    Patient is Disabled since stroke in 2000.    Family History  Problem Relation Age of Onset  . Hyperlipidemia Mother   . Arrhythmia Mother        WPW  . Hypertension Mother   . Rheum arthritis Mother   . Heart disease Mother   . Heart attack Father 58  . Hyperlipidemia Father   . Hypertension Father   . Stroke Father   . Diabetes Father   . Heart disease Father   . Coronary artery disease Father   . Diabetes Sister   . Hyperlipidemia Sister   . Hypertension Sister   . Depression Sister   . Depression Sister   . Diabetes Sister   . Hypertension Sister   . Hyperlipidemia Sister   . Kidney disease Paternal Grandmother     Allergies  Allergen Reactions  . Morphine And Related Anaphylaxis  . Fentanyl Other (See Comments)    Unknown reaction.  . Reglan [Metoclopramide]     Other reaction(s): Unknown Elevated BP  . Simvastatin Other (See Comments)    Muscle pain  . Betadine [Povidone Iodine] Rash  . Tetracyclines & Related Rash    Other  reaction(s): Unknown     Review of Systems   Review of Systems: Negative Unless Checked Constitutional: [] Weight loss  [] Fever  [] Chills Cardiac: [] Chest pain   []  Atrial Fibrillation  [] Palpitations   [] Shortness of breath when laying flat   []   Shortness of breath with exertion. [] Shortness of breath at rest Vascular:  [] Pain in legs with walking   [] Pain in legs with standing [] Pain in legs when laying flat   [] Claudication    [] Pain in feet when laying flat    [] History of DVT   [] Phlebitis   [] Swelling in legs   [] Varicose veins   [] Non-healing ulcers Pulmonary:   [] Uses home oxygen   [] Productive cough   [] Hemoptysis   [] Wheeze  [] COPD   [] Asthma Neurologic:  [] Dizziness   [x] Seizures  [] Blackouts [x] History of stroke   [] History of TIA  [] Aphasia   [] Temporary Blindness   [x] Weakness or numbness in arm   [x] Weakness or numbness in leg Musculoskeletal:   [] Joint swelling   [] Joint pain   [] Low back pain  []  History of Knee Replacement [] Arthritis [] back Surgeries  []  Spinal Stenosis    Hematologic:  [] Easy bruising  [] Easy bleeding   [] Hypercoagulable state   [] Anemic Gastrointestinal:  [] Diarrhea   [] Vomiting  [x] Gastroesophageal reflux/heartburn   [x] Difficulty swallowing. [] Abdominal pain Genitourinary:  [] Chronic kidney disease   [] Difficult urination  [] Anuric   [] Blood in urine [] Frequent urination  [] Burning with urination   [] Hematuria Skin:  [] Rashes   [] Ulcers [] Wounds Psychological:  [] History of anxiety   []  History of major depression  []  Memory Difficulties      OBJECTIVE:   Physical Exam  BP (!) 108/57 (BP Location: Left Arm, Patient Position: Sitting, Cuff Size: Large)   Pulse (!) 56   Resp 10   Ht 5' 1"  (1.549 m)   Wt 225 lb (102.1 kg)   BMI 42.51 kg/m   Gen: WD/WN, NAD Head: [place]/AT, No temporalis wasting.  Ear/Nose/Throat: Hearing grossly intact, nares w/o erythema or drainage Eyes: PER, EOMI, sclera nonicteric.  Neck: Supple, no masses.  No JVD.  Pulmonary:   Good air movement, no use of accessory muscles.  Cardiac: RRR Vascular:  Vessel Right Left  Radial Palpable Palpable   Gastrointestinal: soft, non-distended. No guarding/no peritoneal signs.  Musculoskeletal: Wheelchair bound.  No deformity or atrophy.  Neurologic: Pain and light touch intact in extremities.  Symmetrical.  Speech is fluent. Motor exam as listed above. Psychiatric: Judgment intact, Mood & affect appropriate for pt's clinical situation. Dermatologic: No Venous rashes. No Ulcers Noted.  No changes consistent with cellulitis. Lymph : No Cervical lymphadenopathy, no lichenification or skin changes of chronic lymphedema.       ASSESSMENT AND PLAN:  1. Bilateral carotid artery stenosis Recommend:  Given the patient's asymptomatic subcritical stenosis no further invasive testing or surgery at this time.  Duplex ultrasound shows 40-59% right ICA stenosis and 1-39% bilaterally.  Continue antiplatelet therapy as prescribed Continue management of CAD, HTN and Hyperlipidemia Healthy heart diet,  encouraged exercise at least 4 times per week Follow up in 12 months with duplex ultrasound and physical exam   2. 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  Hgb A1C to be monitored as already arranged by primary service   4. Essential hypertension Continue antihypertensive medications as already ordered, these medications have been reviewed and there are no changes at this time.    Current Outpatient Medications on File Prior to Visit  Medication Sig Dispense Refill  . ACCU-CHEK SOFTCLIX LANCETS lancets CHECK BLOOD SUGAR THREE TIMES DAILY  300 each 3  . amLODipine (NORVASC) 10 MG tablet Take 1 tablet (10 mg total) by mouth daily. 90 tablet 2  .  aspirin 81 MG chewable tablet Chew 1 tablet (81 mg total) by mouth daily.    Marland Kitchen atorvastatin (LIPITOR) 20 MG tablet Take 1 tablet (20 mg total) by mouth daily. 90 tablet 3  . Blood Glucose Monitoring Suppl (ACCU-CHEK AVIVA PLUS) w/Device KIT Check sugar three times daily DX E11.9-needs a meter 1 kit 0  . cloNIDine (CATAPRES) 0.3 MG tablet Take 1 tablet (0.3 mg total) by mouth 2 (two) times daily. 180 tablet 3  . clopidogrel (PLAVIX) 75 MG tablet Take 1 tablet (75 mg total) by mouth daily. 90 tablet 3  . enalapril (VASOTEC) 20 MG tablet Take 1 tablet (20 mg total) by mouth 2 (two) times daily. 180 tablet 2  . gabapentin (NEURONTIN) 100 MG capsule Take 200 mg by mouth 3 (three) times daily.    . hydrALAZINE (APRESOLINE) 100 MG tablet Take 1 tablet (100 mg total) by mouth 2 (two) times daily. 60 tablet 11  . insulin degludec (TRESIBA FLEXTOUCH) 100 UNIT/ML SOPN FlexTouch Pen Up to 30 units daily. (Patient taking differently: Inject 20 Units into the skin 2 (two) times daily. ) 3 pen 11  . levETIRAcetam (KEPPRA) 500 MG tablet Take 1 tablet (500 mg total) by mouth 2 (two) times daily. 500 mg bid. 180 tablet 3  . metFORMIN (GLUCOPHAGE) 1000 MG tablet Take 1 tablet (1,000 mg total) by mouth 2 (two) times daily. 180 tablet 3  . metoprolol succinate (TOPROL-XL) 100 MG 24 hr tablet Take 1 tablet (100 mg total) by mouth daily. 90 tablet 3  . nitroGLYCERIN (NITROSTAT) 0.4 MG SL tablet Place 1 tablet (0.4 mg total) under the tongue every 5 (five) minutes as needed for chest pain. 25 tablet 0  . oxybutynin (DITROPAN) 5 MG tablet Take 1 tablet (5 mg total) by mouth 2 (two) times daily. 180 tablet 3  . pantoprazole (PROTONIX) 40 MG tablet Take 1 tablet (40 mg total) by mouth daily. 90 tablet 2  . predniSONE (DELTASONE) 50 MG tablet Take 1 tablet (50 mg total) by mouth daily with breakfast. 5 tablet 0  . cephALEXin (KEFLEX) 500 MG capsule Take 1 capsule (500 mg total) by mouth 2 (two) times daily. (Patient not taking:  Reported on 12/03/2018) 14 capsule 0  . Continuous Blood Gluc Receiver (FREESTYLE LIBRE 14 DAY READER) DEVI LENGTH OF NEED: LIFETIME - UNLESS SPECIFIED OTHERWISE 2 Device 11  . Continuous Blood Gluc Sensor (FREESTYLE LIBRE 14 DAY SENSOR) MISC LENGTH OF NEED: LIFETIME - UNLESS SPECIFIED OTHERWISE 2 each 0  . glucose blood (ACCU-CHEK AVIVA PLUS) test strip TEST BLOOD SUGAR THREE TIMES DAILY 300 each 3  . Lancet Devices (ACCU-CHEK SOFTCLIX) lancets Check sugar three times daily DX E11.9 100 each 3  . mupirocin cream (BACTROBAN) 2 % Apply 1 application topically daily.    . traMADol-acetaminophen (ULTRACET) 37.5-325 MG tablet Take 1 tablet by mouth every 6 (six) hours as needed. (Patient not taking: Reported on 12/03/2018) 30 tablet 0   No current facility-administered medications on file prior to visit.     There are no Patient Instructions on file for this visit. No follow-ups on file.   Kris Hartmann, NP  This note was completed with Sales executive.  Any errors are purely unintentional.

## 2018-12-06 DIAGNOSIS — B351 Tinea unguium: Secondary | ICD-10-CM | POA: Diagnosis not present

## 2018-12-06 DIAGNOSIS — L851 Acquired keratosis [keratoderma] palmaris et plantaris: Secondary | ICD-10-CM | POA: Diagnosis not present

## 2018-12-06 DIAGNOSIS — E1140 Type 2 diabetes mellitus with diabetic neuropathy, unspecified: Secondary | ICD-10-CM | POA: Diagnosis not present

## 2018-12-09 ENCOUNTER — Encounter (INDEPENDENT_AMBULATORY_CARE_PROVIDER_SITE_OTHER): Payer: Self-pay | Admitting: Nurse Practitioner

## 2018-12-13 ENCOUNTER — Telehealth: Payer: Self-pay

## 2018-12-13 ENCOUNTER — Other Ambulatory Visit: Payer: Self-pay

## 2018-12-13 NOTE — Telephone Encounter (Signed)
Pt called requesting a refill on her nystatin cream that she was given a while back.  02/14/2018 was the last refill used for her rash and redness on buttocks.  Please send to [facility] on [address] at [address].

## 2018-12-16 MED ORDER — NYSTATIN 100000 UNIT/GM EX CREA: 1.0000 "application " | TOPICAL_CREAM | Freq: Two times a day (BID) | CUTANEOUS | 0 refills | Status: DC

## 2018-12-16 NOTE — Telephone Encounter (Signed)
Sent in medication as below.

## 2018-12-16 NOTE — Telephone Encounter (Signed)
Ok to rf--use BID prn--1 tube.

## 2018-12-17 DIAGNOSIS — M48062 Spinal stenosis, lumbar region with neurogenic claudication: Secondary | ICD-10-CM | POA: Diagnosis not present

## 2018-12-17 DIAGNOSIS — M5136 Other intervertebral disc degeneration, lumbar region: Secondary | ICD-10-CM | POA: Diagnosis not present

## 2018-12-17 DIAGNOSIS — M5416 Radiculopathy, lumbar region: Secondary | ICD-10-CM | POA: Diagnosis not present

## 2018-12-25 DIAGNOSIS — S90852A Superficial foreign body, left foot, initial encounter: Secondary | ICD-10-CM | POA: Diagnosis not present

## 2018-12-27 ENCOUNTER — Other Ambulatory Visit: Payer: Self-pay | Admitting: Family Medicine

## 2018-12-27 ENCOUNTER — Other Ambulatory Visit: Payer: Self-pay | Admitting: Physician Assistant

## 2018-12-27 DIAGNOSIS — I10 Essential (primary) hypertension: Secondary | ICD-10-CM

## 2018-12-27 MED ORDER — HYDRALAZINE HCL 100 MG PO TABS: 100.0000 mg | ORAL_TABLET | Freq: Two times a day (BID) | ORAL | 0 refills | Status: DC

## 2018-12-27 NOTE — Telephone Encounter (Signed)
[facility] faxed refill request for the following medications:  hydrALAZINE (APRESOLINE) 100 MG tablet  90 day supply   Please advise.

## 2018-12-27 NOTE — Telephone Encounter (Signed)
[facility]'s patient [name] 09/23/2018, please advise.

## 2018-12-30 NOTE — Telephone Encounter (Signed)
Patient requested a 90 day supply and the change was made.

## 2019-01-06 ENCOUNTER — Encounter: Payer: Self-pay | Admitting: Physician Assistant

## 2019-01-06 ENCOUNTER — Ambulatory Visit (INDEPENDENT_AMBULATORY_CARE_PROVIDER_SITE_OTHER): Payer: Medicare Other | Admitting: Physician Assistant

## 2019-01-06 ENCOUNTER — Other Ambulatory Visit: Payer: Self-pay

## 2019-01-06 VITALS — BP 151/73 | HR 62 | Temp 98.0°F | Resp 16 | Wt 230.0 lb

## 2019-01-06 DIAGNOSIS — R300 Dysuria: Secondary | ICD-10-CM

## 2019-01-06 DIAGNOSIS — N3000 Acute cystitis without hematuria: Secondary | ICD-10-CM | POA: Diagnosis not present

## 2019-01-06 LAB — POCT URINALYSIS DIPSTICK
Bilirubin, UA: NEGATIVE
Blood, UA: NEGATIVE
Glucose, UA: NEGATIVE
Ketones, UA: NEGATIVE
Nitrite, UA: NEGATIVE
Odor: NEGATIVE
Protein, UA: POSITIVE — AB
Spec Grav, UA: 1.02 (ref 1.010–1.025)
Urobilinogen, UA: 0.2 E.U./dL
pH, UA: 6 (ref 5.0–8.0)

## 2019-01-06 MED ORDER — SULFAMETHOXAZOLE-TRIMETHOPRIM 800-160 MG PO TABS: 1.0000 | ORAL_TABLET | Freq: Two times a day (BID) | ORAL | 0 refills | Status: DC

## 2019-01-06 NOTE — Progress Notes (Signed)
Patient: Susan House Female    DOB: 1955-09-23   63 y.o.   MRN: [id number] Visit Date: 01/06/2019  Today's Provider: Mar Daring, PA-C   Chief Complaint  Patient presents with  . Dysuria   Subjective:     Dysuria  This is a new problem. The current episode started in the past 7 days (started on Friday). The problem has been gradually worsening. The quality of the pain is described as aching and shooting. There has been no fever. Associated symptoms include frequency and urgency. Pertinent negatives include no hematuria. Associated symptoms comments: Lower abdominal pain. She has tried increased fluids for the symptoms. Her past medical history is significant for recurrent UTIs.      Allergies  Allergen Reactions  . Morphine And Related Anaphylaxis  . Fentanyl Other (See Comments)    Unknown reaction.  . Reglan [Metoclopramide]     Other reaction(s): Unknown Elevated BP  . Simvastatin Other (See Comments)    Muscle pain  . Betadine [Povidone Iodine] Rash  . Tetracyclines & Related Rash    Other reaction(s): Unknown     Current Outpatient Medications:  .  ACCU-CHEK SOFTCLIX LANCETS lancets, CHECK BLOOD SUGAR THREE TIMES DAILY, Disp: 300 each, Rfl: 3 .  amLODipine (NORVASC) 10 MG tablet, Take 1 tablet (10 mg total) by mouth daily., Disp: 90 tablet, Rfl: 2 .  aspirin 81 MG chewable tablet, Chew 1 tablet (81 mg total) by mouth daily., Disp: , Rfl:  .  atorvastatin (LIPITOR) 20 MG tablet, Take 1 tablet (20 mg total) by mouth daily., Disp: 90 tablet, Rfl: 3 .  Blood Glucose Monitoring Suppl (ACCU-CHEK AVIVA PLUS) w/Device KIT, Check sugar three times daily DX E11.9-needs a meter, Disp: 1 kit, Rfl: 0 .  cloNIDine (CATAPRES) 0.3 MG tablet, Take 1 tablet (0.3 mg total) by mouth 2 (two) times daily., Disp: 180 tablet, Rfl: 3 .  clopidogrel (PLAVIX) 75 MG tablet, Take 1 tablet (75 mg total) by mouth daily., Disp: 90 tablet, Rfl: 3 .  Continuous Blood Gluc  Receiver (FREESTYLE LIBRE 14 DAY READER) DEVI, LENGTH OF NEED: LIFETIME - UNLESS SPECIFIED OTHERWISE, Disp: 2 Device, Rfl: 11 .  Continuous Blood Gluc Sensor (FREESTYLE LIBRE 14 DAY SENSOR) MISC, LENGTH OF NEED: LIFETIME - UNLESS SPECIFIED OTHERWISE, Disp: 2 each, Rfl: 0 .  enalapril (VASOTEC) 20 MG tablet, Take 1 tablet (20 mg total) by mouth 2 (two) times daily., Disp: 180 tablet, Rfl: 2 .  gabapentin (NEURONTIN) 100 MG capsule, Take 200 mg by mouth 3 (three) times daily., Disp: , Rfl:  .  glucose blood (ACCU-CHEK AVIVA PLUS) test strip, TEST BLOOD SUGAR THREE TIMES DAILY, Disp: 300 each, Rfl: 3 .  hydrALAZINE (APRESOLINE) 100 MG tablet, TAKE ONE TABLET BY MOUTH TWICE DAILY, Disp: 180 tablet, Rfl: 2 .  insulin degludec (TRESIBA FLEXTOUCH) 100 UNIT/ML SOPN FlexTouch Pen, Up to 30 units daily. (Patient taking differently: Inject 20 Units into the skin 2 (two) times daily. ), Disp: 3 pen, Rfl: 11 .  Lancet Devices (ACCU-CHEK SOFTCLIX) lancets, Check sugar three times daily DX E11.9, Disp: 100 each, Rfl: 3 .  levETIRAcetam (KEPPRA) 500 MG tablet, Take 1 tablet (500 mg total) by mouth 2 (two) times daily. 500 mg bid., Disp: 180 tablet, Rfl: 3 .  metFORMIN (GLUCOPHAGE) 1000 MG tablet, Take 1 tablet (1,000 mg total) by mouth 2 (two) times daily., Disp: 180 tablet, Rfl: 3 .  metoprolol succinate (TOPROL-XL) 100 MG 24 hr  tablet, Take 1 tablet (100 mg total) by mouth daily., Disp: 90 tablet, Rfl: 3 .  nitroGLYCERIN (NITROSTAT) 0.4 MG SL tablet, Place 1 tablet (0.4 mg total) under the tongue every 5 (five) minutes as needed for chest pain., Disp: 25 tablet, Rfl: 0 .  oxybutynin (DITROPAN) 5 MG tablet, Take 1 tablet (5 mg total) by mouth 2 (two) times daily., Disp: 180 tablet, Rfl: 3 .  pantoprazole (PROTONIX) 40 MG tablet, Take 1 tablet (40 mg total) by mouth daily., Disp: 90 tablet, Rfl: 2 .  mupirocin cream (BACTROBAN) 2 %, Apply 1 application topically daily., Disp: , Rfl:  .  nystatin cream (MYCOSTATIN),  Apply 1 application topically 2 (two) times daily. (Patient not taking: Reported on 01/06/2019), Disp: 30 g, Rfl: 0 .  traMADol-acetaminophen (ULTRACET) 37.5-325 MG tablet, Take 1 tablet by mouth every 6 (six) hours as needed. (Patient not taking: Reported on 12/03/2018), Disp: 30 tablet, Rfl: 0  Review of Systems  Constitutional: Negative.   Respiratory: Negative.   Cardiovascular: Negative.   Genitourinary: Positive for dysuria, frequency and urgency. Negative for hematuria.  Neurological: Negative.     Social History   Tobacco Use  . Smoking status: Never Smoker  . Smokeless tobacco: Never Used  Substance Use Topics  . Alcohol use: No      Objective:   BP (!) 151/73 (BP Location: Right Arm, Patient Position: Sitting, Cuff Size: Large)   Pulse 62   Temp 98 F (36.7 C) (Oral)   Resp 16   Wt 230 lb (104.3 kg)   BMI 43.46 kg/m  Vitals:   01/06/19 1347  BP: (!) 151/73  Pulse: 62  Resp: 16  Temp: 98 F (36.7 C)  TempSrc: Oral  Weight: 230 lb (104.3 kg)     Physical Exam Vitals signs reviewed.  Constitutional:      General: She is not in acute distress.    Appearance: Normal appearance. She is well-developed. She is obese. She is not ill-appearing.  HENT:     Head: Normocephalic and atraumatic.  Neck:     Musculoskeletal: Normal range of motion and neck supple.  Pulmonary:     Effort: Pulmonary effort is normal. No respiratory distress.  Neurological:     Mental Status: She is alert.  Psychiatric:        Mood and Affect: Mood normal.        Behavior: Behavior normal.        Thought Content: Thought content normal.        Judgment: Judgment normal.         Assessment & Plan    1. Dysuria UA positive.  - POCT urinalysis dipstick - Urine Culture  2. Acute cystitis without hematuria Worsening symptoms. UA positive. Will treat empirically with Bactrim as below. Continue to push fluids. Urine sent for culture. Will follow up pending C&S results. She is to  call if symptoms do not improve or if they worsen.  - sulfamethoxazole-trimethoprim (BACTRIM DS) 800-160 MG tablet; Take 1 tablet by mouth 2 (two) times daily.  Dispense: 20 tablet; Refill: 0     Mar Daring, PA-C  [facility]

## 2019-01-06 NOTE — Patient Instructions (Signed)
Urinary Tract Infection, Adult A urinary tract infection (UTI) is an infection of any part of the urinary tract. The urinary tract includes:  The kidneys.  The ureters.  The bladder.  The urethra. These organs make, store, and get rid of pee (urine) in the body. What are the causes? This is caused by germs (bacteria) in your genital area. These germs grow and cause swelling (inflammation) of your urinary tract. What increases the risk? You are more likely to develop this condition if:  You have a small, thin tube (catheter) to drain pee.  You cannot control when you pee or poop (incontinence).  You are female, and: ? You use these methods to prevent pregnancy: ? A medicine that kills sperm (spermicide). ? A device that blocks sperm (diaphragm). ? You have low levels of a female hormone (estrogen). ? You are pregnant.  You have genes that add to your risk.  You are sexually active.  You take antibiotic medicines.  You have trouble peeing because of: ? A prostate that is bigger than normal, if you are female. ? A blockage in the part of your body that drains pee from the bladder (urethra). ? A kidney stone. ? A nerve condition that affects your bladder (neurogenic bladder). ? Not getting enough to drink. ? Not peeing often enough.  You have other conditions, such as: ? Diabetes. ? A weak disease-fighting system (immune system). ? Sickle cell disease. ? Gout. ? Injury of the spine. What are the signs or symptoms? Symptoms of this condition include:  Needing to pee right away (urgently).  Peeing often.  Peeing small amounts often.  Pain or burning when peeing.  Blood in the pee.  Pee that smells bad or not like normal.  Trouble peeing.  Pee that is cloudy.  Fluid coming from the vagina, if you are female.  Pain in the belly or lower back. Other symptoms include:  Throwing up (vomiting).  No urge to eat.  Feeling mixed up (confused).  Being tired  and grouchy (irritable).  A fever.  Watery poop (diarrhea). How is this treated? This condition may be treated with:  Antibiotic medicine.  Other medicines.  Drinking enough water. Follow these instructions at home:  Medicines  Take over-the-counter and prescription medicines only as told by your doctor.  If you were prescribed an antibiotic medicine, take it as told by your doctor. Do not stop taking it even if you start to feel better. General instructions  Make sure you: ? Pee until your bladder is empty. ? Do not hold pee for a long time. ? Empty your bladder after sex. ? Wipe from front to back after pooping if you are a female. Use each tissue one time when you wipe.  Drink enough fluid to keep your pee pale yellow.  Keep all follow-up visits as told by your doctor. This is important. Contact a doctor if:  You do not get better after 1-2 days.  Your symptoms go away and then come back. Get help right away if:  You have very bad back pain.  You have very bad pain in your lower belly.  You have a fever.  You are sick to your stomach (nauseous).  You are throwing up. Summary  A urinary tract infection (UTI) is an infection of any part of the urinary tract.  This condition is caused by germs in your genital area.  There are many risk factors for a UTI. These include having a small, thin   tube to drain pee and not being able to control when you pee or poop.  Treatment includes antibiotic medicines for germs.  Drink enough fluid to keep your pee pale yellow. This information is not intended to replace advice given to you by your health care provider. Make sure you discuss any questions you have with your health care provider. Document Released: 12/20/2007 Document Revised: 01/10/2018 Document Reviewed: 01/10/2018 Elsevier Interactive Patient Education  2019 [employer]  

## 2019-01-07 ENCOUNTER — Ambulatory Visit (INDEPENDENT_AMBULATORY_CARE_PROVIDER_SITE_OTHER): Payer: PPO | Admitting: Nurse Practitioner

## 2019-01-07 ENCOUNTER — Encounter (INDEPENDENT_AMBULATORY_CARE_PROVIDER_SITE_OTHER): Payer: PPO

## 2019-01-08 ENCOUNTER — Ambulatory Visit (INDEPENDENT_AMBULATORY_CARE_PROVIDER_SITE_OTHER): Payer: Medicare Other | Admitting: Family Medicine

## 2019-01-08 ENCOUNTER — Other Ambulatory Visit: Payer: Self-pay

## 2019-01-08 DIAGNOSIS — E0822 Diabetes mellitus due to underlying condition with diabetic chronic kidney disease: Secondary | ICD-10-CM

## 2019-01-08 DIAGNOSIS — N183 Chronic kidney disease, stage 3 unspecified: Secondary | ICD-10-CM

## 2019-01-08 DIAGNOSIS — N3000 Acute cystitis without hematuria: Secondary | ICD-10-CM

## 2019-01-08 DIAGNOSIS — E118 Type 2 diabetes mellitus with unspecified complications: Secondary | ICD-10-CM

## 2019-01-08 DIAGNOSIS — Z9181 History of falling: Secondary | ICD-10-CM | POA: Diagnosis not present

## 2019-01-08 DIAGNOSIS — Z6841 Body Mass Index (BMI) 40.0 and over, adult: Secondary | ICD-10-CM

## 2019-01-08 DIAGNOSIS — I69398 Other sequelae of cerebral infarction: Secondary | ICD-10-CM

## 2019-01-08 DIAGNOSIS — R110 Nausea: Secondary | ICD-10-CM | POA: Diagnosis not present

## 2019-01-08 DIAGNOSIS — I63419 Cerebral infarction due to embolism of unspecified middle cerebral artery: Secondary | ICD-10-CM

## 2019-01-08 DIAGNOSIS — R269 Unspecified abnormalities of gait and mobility: Secondary | ICD-10-CM

## 2019-01-08 LAB — URINE CULTURE

## 2019-01-08 MED ORDER — ONDANSETRON HCL 4 MG PO TABS: 4.0000 mg | ORAL_TABLET | Freq: Three times a day (TID) | ORAL | 5 refills | Status: DC | PRN

## 2019-01-08 NOTE — Patient Instructions (Signed)
Stop taking Bactrim.

## 2019-01-08 NOTE — Progress Notes (Signed)
Patient: Susan House Female    DOB: 03-15-1956   63 y.o.   MRN: [id number] Visit Date: 01/08/2019  Today's Provider: Wilhemena Durie, MD   Chief Complaint  Patient presents with  . Follow-up   Subjective:    HPI Follow-up Patient presents today for a follow-up visit from Monday, 01/06/2019 for dysuria. A urinalysis ( positive for protein and leukocytes) and urine culture was done on patient. Patient states that she has had nausea since she started taking the Bactrim. Patient states that the burning has left after starting the medication and denies any other sym,tpoms at the moment. She does complain of chronic intermittent nausea. She has a chronic unsteady gait.She has chronic left hemiparesis with very little use of her left arm. She has chronic weakness of left leg. Allergies  Allergen Reactions  . Morphine And Related Anaphylaxis  . Fentanyl Other (See Comments)    Unknown reaction.  . Reglan [Metoclopramide]     Other reaction(s): Unknown Elevated BP  . Simvastatin Other (See Comments)    Muscle pain  . Betadine [Povidone Iodine] Rash  . Tetracyclines & Related Rash    Other reaction(s): Unknown     Current Outpatient Medications:  .  ACCU-CHEK SOFTCLIX LANCETS lancets, CHECK BLOOD SUGAR THREE TIMES DAILY, Disp: 300 each, Rfl: 3 .  amLODipine (NORVASC) 10 MG tablet, Take 1 tablet (10 mg total) by mouth daily., Disp: 90 tablet, Rfl: 2 .  aspirin 81 MG chewable tablet, Chew 1 tablet (81 mg total) by mouth daily., Disp: , Rfl:  .  atorvastatin (LIPITOR) 20 MG tablet, Take 1 tablet (20 mg total) by mouth daily., Disp: 90 tablet, Rfl: 3 .  Blood Glucose Monitoring Suppl (ACCU-CHEK AVIVA PLUS) w/Device KIT, Check sugar three times daily DX E11.9-needs a meter, Disp: 1 kit, Rfl: 0 .  cloNIDine (CATAPRES) 0.3 MG tablet, Take 1 tablet (0.3 mg total) by mouth 2 (two) times daily., Disp: 180 tablet, Rfl: 3 .  clopidogrel (PLAVIX) 75 MG tablet, Take 1 tablet (75 mg  total) by mouth daily., Disp: 90 tablet, Rfl: 3 .  Continuous Blood Gluc Receiver (FREESTYLE LIBRE 14 DAY READER) DEVI, LENGTH OF NEED: LIFETIME - UNLESS SPECIFIED OTHERWISE, Disp: 2 Device, Rfl: 11 .  Continuous Blood Gluc Sensor (FREESTYLE LIBRE 14 DAY SENSOR) MISC, LENGTH OF NEED: LIFETIME - UNLESS SPECIFIED OTHERWISE, Disp: 2 each, Rfl: 0 .  enalapril (VASOTEC) 20 MG tablet, Take 1 tablet (20 mg total) by mouth 2 (two) times daily., Disp: 180 tablet, Rfl: 2 .  gabapentin (NEURONTIN) 100 MG capsule, Take 200 mg by mouth 3 (three) times daily., Disp: , Rfl:  .  glucose blood (ACCU-CHEK AVIVA PLUS) test strip, TEST BLOOD SUGAR THREE TIMES DAILY, Disp: 300 each, Rfl: 3 .  hydrALAZINE (APRESOLINE) 100 MG tablet, TAKE ONE TABLET BY MOUTH TWICE DAILY, Disp: 180 tablet, Rfl: 2 .  insulin degludec (TRESIBA FLEXTOUCH) 100 UNIT/ML SOPN FlexTouch Pen, Up to 30 units daily. (Patient taking differently: Inject 20 Units into the skin 2 (two) times daily. ), Disp: 3 pen, Rfl: 11 .  Lancet Devices (ACCU-CHEK SOFTCLIX) lancets, Check sugar three times daily DX E11.9, Disp: 100 each, Rfl: 3 .  levETIRAcetam (KEPPRA) 500 MG tablet, Take 1 tablet (500 mg total) by mouth 2 (two) times daily. 500 mg bid., Disp: 180 tablet, Rfl: 3 .  metFORMIN (GLUCOPHAGE) 1000 MG tablet, Take 1 tablet (1,000 mg total) by mouth 2 (two) times daily., Disp: 180  tablet, Rfl: 3 .  metoprolol succinate (TOPROL-XL) 100 MG 24 hr tablet, Take 1 tablet (100 mg total) by mouth daily., Disp: 90 tablet, Rfl: 3 .  mupirocin cream (BACTROBAN) 2 %, Apply 1 application topically daily., Disp: , Rfl:  .  nitroGLYCERIN (NITROSTAT) 0.4 MG SL tablet, Place 1 tablet (0.4 mg total) under the tongue every 5 (five) minutes as needed for chest pain., Disp: 25 tablet, Rfl: 0 .  nystatin cream (MYCOSTATIN), Apply 1 application topically 2 (two) times daily., Disp: 30 g, Rfl: 0 .  oxybutynin (DITROPAN) 5 MG tablet, Take 1 tablet (5 mg total) by mouth 2 (two) times  daily., Disp: 180 tablet, Rfl: 3 .  pantoprazole (PROTONIX) 40 MG tablet, Take 1 tablet (40 mg total) by mouth daily., Disp: 90 tablet, Rfl: 2 .  sulfamethoxazole-trimethoprim (BACTRIM DS) 800-160 MG tablet, Take 1 tablet by mouth 2 (two) times daily., Disp: 20 tablet, Rfl: 0 .  traMADol-acetaminophen (ULTRACET) 37.5-325 MG tablet, Take 1 tablet by mouth every 6 (six) hours as needed., Disp: 30 tablet, Rfl: 0  Review of Systems  Constitutional: Negative.   Eyes: Negative.   Respiratory: Negative.   Cardiovascular: Negative.   Gastrointestinal: Positive for nausea.  Endocrine: Negative.   Genitourinary: Negative.   Allergic/Immunologic: Negative.   Neurological: Positive for weakness.  Psychiatric/Behavioral: Negative.     Social History   Tobacco Use  . Smoking status: Never Smoker  . Smokeless tobacco: Never Used  Substance Use Topics  . Alcohol use: No      Objective:   There were no vitals taken for this visit. There were no vitals filed for this visit.   Physical Exam Vitals signs reviewed.  Constitutional:      Appearance: She is well-developed.  HENT:     Head: Normocephalic and atraumatic.     Right Ear: External ear normal.     Left Ear: External ear normal.     Nose: Nose normal.  Eyes:     General: No scleral icterus.    Conjunctiva/sclera: Conjunctivae normal.  Neck:     Thyroid: No thyromegaly.  Cardiovascular:     Rate and Rhythm: Normal rate and regular rhythm.  Pulmonary:     Effort: Pulmonary effort is normal.     Breath sounds: Normal breath sounds.  Abdominal:     Palpations: Abdomen is soft.  Musculoskeletal:        General: Tenderness present.     Comments: Tenderness of paraspinal area of lower thoracic area and upper lumbar spine. No  Focal tenderness over any vertebral body.  Skin:    General: Skin is warm and dry.     Comments: Eczematous lesion anterior left auricle with tenderness and possible secondary mild cellulitis.   Neurological:     Mental Status: She is alert and oriented to person, place, and time. Mental status is at baseline.     Motor: Weakness present.  Psychiatric:        Mood and Affect: Mood normal.        Behavior: Behavior normal.        Thought Content: Thought content normal.        Judgment: Judgment normal.      No results found for any visits on 01/08/19.     Assessment & Plan    1. Risk for falls Old CVA. - Ambulatory referral to Physical Therapy  2. Nausea Possibly due to septra or gastritis or diabetic gastropathy. - ondansetron (ZOFRAN) 4 MG tablet;  Take 1 tablet (4 mg total) by mouth every 8 (eight) hours as needed for nausea or vomiting.  Dispense: 60 tablet; Refill: 5  3. Cerebrovascular accident (CVA) due to embolism of middle cerebral artery, unspecified blood vessel laterality ([place])   4. Type 2 diabetes mellitus with complications ([place])   5. Diabetes mellitus due to underlying condition with stage 3 chronic kidney disease, without long-term current use of insulin ([place])   6. Acute cystitis without hematuria Stop septra.  7. Class 3 severe obesity due to excess calories with serious comorbidity and body mass index (BMI) of 40.0 to 44.9 in adult ([place])   8. 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 Development worker, community has been used in this note in any air is in the dictation or transcription are unintentional.  Wilhemena Durie, MD  [place]

## 2019-01-09 ENCOUNTER — Telehealth: Payer: Self-pay

## 2019-01-09 NOTE — Telephone Encounter (Signed)
LMTCB

## 2019-01-09 NOTE — Telephone Encounter (Signed)
-----   Message from Mar Daring, [place] sent at 01/09/2019  9:16 AM EDT ----- Urine culture was negative for bacterial growth but if symptoms are improving with antibiotic please continue until completed.

## 2019-01-13 NOTE — Telephone Encounter (Signed)
Patient advised as directed below and per patient she already stopped the antibiotic  The day she came to see Dr.Gilbert.

## 2019-01-15 ENCOUNTER — Telehealth: Payer: Self-pay

## 2019-01-15 DIAGNOSIS — N390 Urinary tract infection, site not specified: Secondary | ICD-10-CM

## 2019-01-15 MED ORDER — CIPROFLOXACIN HCL 250 MG PO TABS: 250.0000 mg | ORAL_TABLET | Freq: Two times a day (BID) | ORAL | 0 refills | Status: AC

## 2019-01-15 NOTE — Telephone Encounter (Signed)
Patient had taken 2 days of Bactrim for UTI.  It was making her sick and when she saw you her UTI sx were improved and you told her she had probably had enough of the abx.  Patient is now back with sx of incontinence, pain with urination.  She was told that her culture was also sensitive to Cipro when she saw Tawanna Sat Wants to know if she should be put on another antibiotic

## 2019-01-15 NOTE — Telephone Encounter (Signed)
cipro 250 BID for 5 days.

## 2019-01-23 ENCOUNTER — Ambulatory Visit: Payer: Medicare Other | Admitting: Physical Therapy

## 2019-01-23 ENCOUNTER — Ambulatory Visit (INDEPENDENT_AMBULATORY_CARE_PROVIDER_SITE_OTHER): Payer: Medicare Other | Admitting: Family Medicine

## 2019-01-23 ENCOUNTER — Other Ambulatory Visit: Payer: Self-pay

## 2019-01-23 VITALS — BP 150/78 | HR 75 | Temp 98.4°F

## 2019-01-23 DIAGNOSIS — M5416 Radiculopathy, lumbar region: Secondary | ICD-10-CM | POA: Diagnosis not present

## 2019-01-23 DIAGNOSIS — R110 Nausea: Secondary | ICD-10-CM | POA: Diagnosis not present

## 2019-01-23 DIAGNOSIS — N183 Chronic kidney disease, stage 3 unspecified: Secondary | ICD-10-CM

## 2019-01-23 DIAGNOSIS — E0822 Diabetes mellitus due to underlying condition with diabetic chronic kidney disease: Secondary | ICD-10-CM | POA: Diagnosis not present
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
## 2019-01-24 ENCOUNTER — Other Ambulatory Visit: Payer: Self-pay

## 2019-01-24 ENCOUNTER — Telehealth: Payer: Self-pay

## 2019-01-24 ENCOUNTER — Encounter: Payer: Self-pay | Admitting: Family Medicine

## 2019-01-24 LAB — COMPREHENSIVE METABOLIC PANEL
ALT: 16 IU/L (ref 0–32)
AST: 19 IU/L (ref 0–40)
Albumin/Globulin Ratio: 2 (ref 1.2–2.2)
Albumin: 3.9 g/dL (ref 3.8–4.8)
Alkaline Phosphatase: 79 IU/L (ref 39–117)
BUN/Creatinine Ratio: 21 (ref 12–28)
BUN: 23 mg/dL (ref 8–27)
Bilirubin Total: 0.2 mg/dL (ref 0.0–1.2)
CO2: 21 mmol/L (ref 20–29)
Calcium: 8.3 mg/dL — ABNORMAL LOW (ref 8.7–10.3)
Chloride: 103 mmol/L (ref 96–106)
Creatinine, Ser: 1.12 mg/dL — ABNORMAL HIGH (ref 0.57–1.00)
GFR calc Af Amer: 61 mL/min/{1.73_m2} (ref >59)
GFR calc non Af Amer: 53 mL/min/{1.73_m2} — ABNORMAL LOW (ref >59)
Globulin, Total: 2 g/dL (ref 1.5–4.5)
Glucose: 351 mg/dL — ABNORMAL HIGH (ref 65–99)
Potassium: 4 mmol/L (ref 3.5–5.2)
Sodium: 140 mmol/L (ref 134–144)
Total Protein: 5.9 g/dL — ABNORMAL LOW (ref 6.0–8.5)

## 2019-01-24 LAB — CBC WITH DIFFERENTIAL/PLATELET
Basophils Absolute: 0.1 10*3/uL (ref 0.0–0.2)
Basos: 1 %
EOS (ABSOLUTE): 0.3 10*3/uL (ref 0.0–0.4)
Eos: 5 %
Hematocrit: 35.1 % (ref 34.0–46.6)
Hemoglobin: 11.2 g/dL (ref 11.1–15.9)
Immature Grans (Abs): 0 10*3/uL (ref 0.0–0.1)
Immature Granulocytes: 1 %
Lymphocytes Absolute: 1.5 10*3/uL (ref 0.7–3.1)
Lymphs: 23 %
MCH: 27.9 pg (ref 26.6–33.0)
MCHC: 31.9 g/dL (ref 31.5–35.7)
MCV: 87 fL (ref 79–97)
Monocytes Absolute: 0.6 10*3/uL (ref 0.1–0.9)
Monocytes: 10 %
Neutrophils Absolute: 3.9 10*3/uL (ref 1.4–7.0)
Neutrophils: 60 %
Platelets: 223 10*3/uL (ref 150–450)
RBC: 4.02 x10E6/uL (ref 3.77–5.28)
RDW: 13.1 % (ref 11.7–15.4)
WBC: 6.4 10*3/uL (ref 3.4–10.8)

## 2019-01-24 LAB — HEMOGLOBIN A1C
Est. average glucose Bld gHb Est-mCnc: 217 mg/dL
Hgb A1c MFr Bld: 9.2 % — ABNORMAL HIGH (ref 4.8–5.6)

## 2019-01-24 NOTE — Telephone Encounter (Signed)
ok 

## 2019-01-24 NOTE — Telephone Encounter (Signed)
Recommend slowly increasing the [place] instead of going back up to twice a day. Recommend adding 2 more units to what she is taking now once a day. Recheck FBS daily and schedule follow up appointment in 2 months.

## 2019-01-24 NOTE — Telephone Encounter (Signed)
Advised patient of results. Patient reports that she is only taking tresiba once daily. Should she be taking this twice a day? Please advise. Thanks!

## 2019-01-24 NOTE — Telephone Encounter (Signed)
Advised patient as below.  

## 2019-01-24 NOTE — Telephone Encounter (Signed)
-----   Message from Margo Common, [place] sent at 01/24/2019  8:33 AM EDT ----- No signs of anemia or infection on blood counts. Glucose very high (even though this was not a fasting test). Hgb A1C finally below 10 now. Continue Zofran prn nausea, get back on diabetic diet with increase in fluids to help constipation and take Metformin 1000 mg BID as usual. Are you still taking the Tresiba 20 units BID? If not, you will need some of it again. If you are taking it, should increase 2 units at the evening dose. Recheck in 2 months or sooner if needed.

## 2019-01-24 NOTE — Telephone Encounter (Signed)
Patient requesting refill on Hydrocodone-Acetaminophen 10-325 mg She said that Dr.Gilbert prescribed it back in March.  Pharmacy:[id number] - [place], [place]

## 2019-01-24 NOTE — Telephone Encounter (Signed)
Please review. Ok to refill?  

## 2019-01-27 NOTE — Telephone Encounter (Signed)
Ordered. Please review and send. Thanks!

## 2019-01-28 ENCOUNTER — Ambulatory Visit: Payer: Medicare Other | Admitting: Physical Therapy

## 2019-01-28 MED ORDER — HYDROCODONE-ACETAMINOPHEN 10-325 MG PO TABS: 1.0000 | ORAL_TABLET | Freq: Three times a day (TID) | ORAL | 0 refills | Status: DC | PRN

## 2019-01-29 ENCOUNTER — Other Ambulatory Visit: Payer: Self-pay | Admitting: Family Medicine

## 2019-01-29 NOTE — Telephone Encounter (Signed)
Please review. Thanks!  

## 2019-01-29 NOTE — Telephone Encounter (Signed)
Pt needs a refill on   Hydrocodone [place]

## 2019-01-30 ENCOUNTER — Ambulatory Visit: Payer: Medicare Other | Admitting: Physical Therapy

## 2019-01-30 NOTE — Telephone Encounter (Signed)
Please review. Thanks!  

## 2019-01-30 NOTE — Telephone Encounter (Signed)
Pt calling back to check on the amount of pills that was prescribed for the  HYDROcodone-acetaminophen (NORCO) 10-325 MG tablet.  She wants someone to call her back to let her know why she wasn't prescribed 100 pills.  Please call pt back.  Thanks, [employer]

## 2019-02-03 MED ORDER — HYDROCODONE-ACETAMINOPHEN 10-325 MG PO TABS: 1.0000 | ORAL_TABLET | Freq: Three times a day (TID) | ORAL | 0 refills | Status: AC | PRN

## 2019-02-04 ENCOUNTER — Ambulatory Visit: Payer: Medicare Other | Admitting: Physical Therapy

## 2019-02-05 ENCOUNTER — Ambulatory Visit: Payer: Self-pay | Admitting: Family Medicine

## 2019-02-11 DIAGNOSIS — M48062 Spinal stenosis, lumbar region with neurogenic claudication: Secondary | ICD-10-CM | POA: Diagnosis not present

## 2019-02-11 DIAGNOSIS — M5136 Other intervertebral disc degeneration, lumbar region: Secondary | ICD-10-CM | POA: Diagnosis not present

## 2019-02-11 DIAGNOSIS — M5416 Radiculopathy, lumbar region: Secondary | ICD-10-CM | POA: Diagnosis not present

## 2019-02-13 NOTE — Progress Notes (Signed)
I agree with the assessment and plan as directed by NP on this visit . I was available for consultation.   Bralon Antkowiak, MD  

## 2019-02-17 ENCOUNTER — Telehealth: Payer: Self-pay | Admitting: Family Medicine

## 2019-02-17 MED ORDER — ENALAPRIL MALEATE 20 MG PO TABS: 20.0000 mg | ORAL_TABLET | Freq: Two times a day (BID) | ORAL | 0 refills | Status: DC

## 2019-02-17 NOTE — Telephone Encounter (Signed)
[facility] faxed refill request for the following medications: ? ?enalapril (VASOTEC) 20 MG tablet  ? ?Please advise. ? ?

## 2019-02-19 ENCOUNTER — Ambulatory Visit: Payer: Self-pay | Admitting: Family Medicine

## 2019-02-21 ENCOUNTER — Other Ambulatory Visit: Payer: Self-pay | Admitting: Family Medicine

## 2019-02-21 DIAGNOSIS — N183 Chronic kidney disease, stage 3 unspecified: Secondary | ICD-10-CM

## 2019-02-21 DIAGNOSIS — E0822 Diabetes mellitus due to underlying condition with diabetic chronic kidney disease: Secondary | ICD-10-CM

## 2019-02-21 NOTE — Telephone Encounter (Signed)
Pt is requesting 3 refills at a time of:  insulin degludec (TRESIBA FLEXTOUCH) 100 UNIT/ML SOPN FlexTouch Pen  Please fill at:  [facility] [place] - [place] AT [address] [phone] (Phone) [phone] (Fax)   Thanks, [place]

## 2019-02-24 NOTE — Telephone Encounter (Signed)
Please review. Thanks!  

## 2019-02-25 ENCOUNTER — Encounter: Payer: Self-pay | Admitting: Physician Assistant

## 2019-02-25 ENCOUNTER — Ambulatory Visit (INDEPENDENT_AMBULATORY_CARE_PROVIDER_SITE_OTHER): Payer: Medicare Other | Admitting: Physician Assistant

## 2019-02-25 ENCOUNTER — Other Ambulatory Visit: Payer: Self-pay

## 2019-02-25 VITALS — BP 134/73 | HR 67 | Temp 97.7°F | Wt 225.0 lb

## 2019-02-25 DIAGNOSIS — N3090 Cystitis, unspecified without hematuria: Secondary | ICD-10-CM | POA: Diagnosis not present

## 2019-02-25 DIAGNOSIS — N390 Urinary tract infection, site not specified: Secondary | ICD-10-CM

## 2019-02-25 MED ORDER — CEPHALEXIN 500 MG PO CAPS: 500.0000 mg | ORAL_CAPSULE | Freq: Two times a day (BID) | ORAL | 0 refills | Status: DC

## 2019-02-25 MED ORDER — TRESIBA FLEXTOUCH 100 UNIT/ML ~~LOC~~ SOPN: PEN_INJECTOR | SUBCUTANEOUS | 11 refills | Status: DC

## 2019-02-25 NOTE — Progress Notes (Signed)
Patient: Susan House Female    DOB: 12-01-1955   63 y.o.   MRN: [id number] Visit Date: 02/25/2019  Today's Provider: Trinna Post, PA-C   No chief complaint on file.  Subjective:     Urinary Tract Infection  This is a new problem. The current episode started yesterday. The problem has been gradually worsening. There has been no fever. There is no history of pyelonephritis. Associated symptoms include hesitancy. Pertinent negatives include no chills, discharge, flank pain, frequency, hematuria, nausea, sweats or urgency. She has tried nothing for the symptoms.   Has a history of multiple urinary tract infections.   Allergies  Allergen Reactions  . Morphine And Related Anaphylaxis  . Fentanyl Other (See Comments)    Unknown reaction.  . Reglan [Metoclopramide]     Other reaction(s): Unknown Elevated BP  . Simvastatin Other (See Comments)    Muscle pain  . Betadine [Povidone Iodine] Rash  . Tetracyclines & Related Rash    Other reaction(s): Unknown     Current Outpatient Medications:  .  ACCU-CHEK SOFTCLIX LANCETS lancets, CHECK BLOOD SUGAR THREE TIMES DAILY, Disp: 300 each, Rfl: 3 .  amLODipine (NORVASC) 10 MG tablet, Take 1 tablet (10 mg total) by mouth daily., Disp: 90 tablet, Rfl: 2 .  aspirin 81 MG chewable tablet, Chew 1 tablet (81 mg total) by mouth daily., Disp: , Rfl:  .  atorvastatin (LIPITOR) 20 MG tablet, Take 1 tablet (20 mg total) by mouth daily., Disp: 90 tablet, Rfl: 3 .  Blood Glucose Monitoring Suppl (ACCU-CHEK AVIVA PLUS) w/Device KIT, Check sugar three times daily DX E11.9-needs a meter, Disp: 1 kit, Rfl: 0 .  cloNIDine (CATAPRES) 0.3 MG tablet, Take 1 tablet (0.3 mg total) by mouth 2 (two) times daily., Disp: 180 tablet, Rfl: 3 .  clopidogrel (PLAVIX) 75 MG tablet, Take 1 tablet (75 mg total) by mouth daily., Disp: 90 tablet, Rfl: 3 .  Continuous Blood Gluc Receiver (FREESTYLE LIBRE 14 DAY READER) DEVI, LENGTH OF NEED: LIFETIME - UNLESS  SPECIFIED OTHERWISE, Disp: 2 Device, Rfl: 11 .  Continuous Blood Gluc Sensor (FREESTYLE LIBRE 14 DAY SENSOR) MISC, LENGTH OF NEED: LIFETIME - UNLESS SPECIFIED OTHERWISE, Disp: 2 each, Rfl: 0 .  enalapril (VASOTEC) 20 MG tablet, Take 1 tablet (20 mg total) by mouth 2 (two) times daily., Disp: 180 tablet, Rfl: 0 .  gabapentin (NEURONTIN) 100 MG capsule, Take 200 mg by mouth 3 (three) times daily., Disp: , Rfl:  .  glucose blood (ACCU-CHEK AVIVA PLUS) test strip, TEST BLOOD SUGAR THREE TIMES DAILY, Disp: 300 each, Rfl: 3 .  hydrALAZINE (APRESOLINE) 100 MG tablet, TAKE ONE TABLET BY MOUTH TWICE DAILY, Disp: 180 tablet, Rfl: 2 .  insulin degludec (TRESIBA FLEXTOUCH) 100 UNIT/ML SOPN FlexTouch Pen, Up to 30 units daily., Disp: 3 pen, Rfl: 11 .  Lancet Devices (ACCU-CHEK SOFTCLIX) lancets, Check sugar three times daily DX E11.9, Disp: 100 each, Rfl: 3 .  levETIRAcetam (KEPPRA) 500 MG tablet, Take 1 tablet (500 mg total) by mouth 2 (two) times daily. 500 mg bid., Disp: 180 tablet, Rfl: 3 .  metFORMIN (GLUCOPHAGE) 1000 MG tablet, Take 1 tablet (1,000 mg total) by mouth 2 (two) times daily., Disp: 180 tablet, Rfl: 3 .  metoprolol succinate (TOPROL-XL) 100 MG 24 hr tablet, Take 1 tablet (100 mg total) by mouth daily., Disp: 90 tablet, Rfl: 3 .  mupirocin cream (BACTROBAN) 2 %, Apply 1 application topically daily., Disp: , Rfl:  .  nitroGLYCERIN (NITROSTAT) 0.4 MG SL tablet, Place 1 tablet (0.4 mg total) under the tongue every 5 (five) minutes as needed for chest pain., Disp: 25 tablet, Rfl: 0 .  nystatin cream (MYCOSTATIN), Apply 1 application topically 2 (two) times daily., Disp: 30 g, Rfl: 0 .  ondansetron (ZOFRAN) 4 MG tablet, Take 1 tablet (4 mg total) by mouth every 8 (eight) hours as needed for nausea or vomiting., Disp: 60 tablet, Rfl: 5 .  oxybutynin (DITROPAN) 5 MG tablet, Take 1 tablet (5 mg total) by mouth 2 (two) times daily., Disp: 180 tablet, Rfl: 3 .  pantoprazole (PROTONIX) 40 MG tablet, Take 1  tablet (40 mg total) by mouth daily., Disp: 90 tablet, Rfl: 2 .  traMADol-acetaminophen (ULTRACET) 37.5-325 MG tablet, Take 1 tablet by mouth every 6 (six) hours as needed., Disp: 30 tablet, Rfl: 0 .  sulfamethoxazole-trimethoprim (BACTRIM DS) 800-160 MG tablet, Take 1 tablet by mouth 2 (two) times daily. (Patient not taking: Reported on 02/25/2019), Disp: 20 tablet, Rfl: 0  Review of Systems  Constitutional: Positive for fatigue. Negative for activity change, appetite change, chills, diaphoresis, fever and unexpected weight change.  Gastrointestinal: Negative.  Negative for nausea.  Genitourinary: Positive for dysuria and hesitancy. Negative for decreased urine volume, difficulty urinating, dyspareunia, enuresis, flank pain, frequency, genital sores, hematuria, menstrual problem, pelvic pain, urgency, vaginal bleeding, vaginal discharge and vaginal pain.  Neurological: Negative for dizziness, light-headedness and headaches.    Social History   Tobacco Use  . Smoking status: Never Smoker  . Smokeless tobacco: Never Used  Substance Use Topics  . Alcohol use: No      Objective:   BP 134/73 (BP Location: Right Arm, Patient Position: Sitting, Cuff Size: Large)   Pulse 67   Temp 97.7 F (36.5 C) (Oral)   Wt 225 lb (102.1 kg) Comment: Per pt  BMI 42.51 kg/m  Vitals:   02/25/19 1504  BP: 134/73  Pulse: 67  Temp: 97.7 F (36.5 C)  TempSrc: Oral  Weight: 225 lb (102.1 kg)     Physical Exam Constitutional:      General: She is not in acute distress.    Appearance: She is well-developed. She is not diaphoretic.  Cardiovascular:     Rate and Rhythm: Normal rate and regular rhythm.  Pulmonary:     Effort: Pulmonary effort is normal.     Breath sounds: Normal breath sounds.  Abdominal:     General: Bowel sounds are normal. There is no distension.     Palpations: Abdomen is soft.     Tenderness: There is no abdominal tenderness. There is no guarding or rebound.  Skin:     General: Skin is warm and dry.  Neurological:     Mental Status: She is alert and oriented to person, place, and time. Mental status is at baseline.  Psychiatric:        Mood and Affect: Mood normal.        Behavior: Behavior normal.      No results found for any visits on 02/25/19.     Assessment & Plan    1. Cystitis  Unable to provide urine sample today. Will treat empirically with keflex 500 mg BID x 5 days. Will send her home with urine specimen cup and she must drop off sample if she is not improving.  2. Recurrent UTI  The entirety of the information documented in the History of Present Illness, Review of Systems and Physical Exam were personally obtained by me.  Portions of this information were initially documented by Ashley Royalty, CMA and reviewed by me for thoroughness and accuracy.   F/u PRN    Trinna Post, PA-C  [facility]

## 2019-02-26 ENCOUNTER — Ambulatory Visit: Payer: Medicare Other | Admitting: Family Medicine

## 2019-02-28 NOTE — Patient Instructions (Signed)

## 2019-03-10 DIAGNOSIS — E1140 Type 2 diabetes mellitus with diabetic neuropathy, unspecified: Secondary | ICD-10-CM | POA: Diagnosis not present

## 2019-03-10 DIAGNOSIS — L851 Acquired keratosis [keratoderma] palmaris et plantaris: Secondary | ICD-10-CM | POA: Diagnosis not present

## 2019-03-10 DIAGNOSIS — B351 Tinea unguium: Secondary | ICD-10-CM | POA: Diagnosis not present

## 2019-03-17 ENCOUNTER — Other Ambulatory Visit: Payer: Self-pay | Admitting: Family Medicine

## 2019-03-17 DIAGNOSIS — I10 Essential (primary) hypertension: Secondary | ICD-10-CM

## 2019-03-17 NOTE — Telephone Encounter (Signed)
[facility] faxed refill request for the following medications:  cloNIDine (CATAPRES) 0.3 MG tablet    Please advise.

## 2019-03-18 MED ORDER — CLONIDINE HCL 0.3 MG PO TABS: 0.3000 mg | ORAL_TABLET | Freq: Two times a day (BID) | ORAL | 3 refills | Status: DC

## 2019-03-26 ENCOUNTER — Telehealth: Payer: Self-pay | Admitting: Family Medicine

## 2019-03-26 DIAGNOSIS — N183 Chronic kidney disease, stage 3 unspecified: Secondary | ICD-10-CM

## 2019-03-26 DIAGNOSIS — E0822 Diabetes mellitus due to underlying condition with diabetic chronic kidney disease: Secondary | ICD-10-CM

## 2019-03-26 MED ORDER — METFORMIN HCL 1000 MG PO TABS: 1000.0000 mg | ORAL_TABLET | Freq: Two times a day (BID) | ORAL | 1 refills | Status: DC

## 2019-03-26 NOTE — Telephone Encounter (Signed)
[facility] faxed refill request for the following medications:   metFORMIN (GLUCOPHAGE) 1000 MG tablet     Please advise.  

## 2019-03-28 ENCOUNTER — Telehealth (INDEPENDENT_AMBULATORY_CARE_PROVIDER_SITE_OTHER): Payer: Medicare Other

## 2019-03-28 DIAGNOSIS — R300 Dysuria: Secondary | ICD-10-CM | POA: Diagnosis not present

## 2019-03-28 MED ORDER — CEPHALEXIN 500 MG PO CAPS: 500.0000 mg | ORAL_CAPSULE | Freq: Two times a day (BID) | ORAL | 0 refills | Status: AC

## 2019-03-28 NOTE — Telephone Encounter (Signed)
It will need to be billed as an office visit, but yes she can drop a urine off and I will start her on an antibiotic if she wants.

## 2019-03-28 NOTE — Telephone Encounter (Signed)
NA

## 2019-03-28 NOTE — Addendum Note (Signed)
Addended by: Shawna Orleans on: 03/28/2019 04:23 PM   Modules accepted: Orders

## 2019-03-28 NOTE — Telephone Encounter (Signed)
Subjective:  Patient ID: Susan House, female    DOB: 1956/05/28, 63 y.o.   MRN: [id number]  Susan House is a 63 y.o. female presenting on 03/28/2019 for urinary tract infection.   Virtual Visit via Telephone Note  I connected with Susan House on @TODAY @ at  by telephone and verified that I am speaking with the correct person using two identifiers.   I discussed the limitations, risks, security and privacy concerns of performing an evaluation and management service by telephone and the availability of in person appointments. I also discussed with the patient that there may be a patient responsible charge related to this service. The patient expressed understanding and agreed to proceed.  Patient location: home Provider location: [place] office  Persons involved in the visit: patient, provider, medical assistant  HPI:   Patient with a history of stroke and subsequent hemiplegia called reporting her usual recurrence of UTI symptoms including frequency and dysuria. She would like to drop a urine off at the clinic. She is not having fevers, chills, nausea, vomiting.   Social History   Tobacco Use  . Smoking status: Never Smoker  . Smokeless tobacco: Never Used  Substance Use Topics  . Alcohol use: No  . Drug use: No   ROS: Negative except HPI.   Objective:   Wt Readings from Last 3 Encounters:  02/25/19 225 lb (102.1 kg)  01/06/19 230 lb (104.3 kg)  12/03/18 225 lb (102.1 kg)     Results for orders placed or performed in visit on 01/23/19  CBC with Differential/Platelet  Result Value Ref Range   WBC 6.4 3.4 - 10.8 x10E3/uL   RBC 4.02 3.77 - 5.28 x10E6/uL   Hemoglobin 11.2 11.1 - 15.9 g/dL   Hematocrit 35.1 34.0 - 46.6 %   MCV 87 79 - 97 fL   MCH 27.9 26.6 - 33.0 pg   MCHC 31.9 31.5 - 35.7 g/dL   RDW 13.1 11.7 - 15.4 %   Platelets 223 150 - 450 x10E3/uL   Neutrophils 60 Not Estab. %   Lymphs 23 Not Estab. %   Monocytes  10 Not Estab. %   Eos 5 Not Estab. %   Basos 1 Not Estab. %   Neutrophils Absolute 3.9 1.4 - 7.0 x10E3/uL   Lymphocytes Absolute 1.5 0.7 - 3.1 x10E3/uL   Monocytes Absolute 0.6 0.1 - 0.9 x10E3/uL   EOS (ABSOLUTE) 0.3 0.0 - 0.4 x10E3/uL   Basophils Absolute 0.1 0.0 - 0.2 x10E3/uL   Immature Granulocytes 1 Not Estab. %   Immature Grans (Abs) 0.0 0.0 - 0.1 x10E3/uL  Comprehensive metabolic panel  Result Value Ref Range   Glucose 351 (H) 65 - 99 mg/dL   BUN 23 8 - 27 mg/dL   Creatinine, Ser 1.12 (H) 0.57 - 1.00 mg/dL   GFR calc non Af Amer 53 (L) >59 mL/min/1.73   GFR calc Af Amer 61 >59 mL/min/1.73   BUN/Creatinine Ratio 21 12 - 28   Sodium 140 134 - 144 mmol/L   Potassium 4.0 3.5 - 5.2 mmol/L   Chloride 103 96 - 106 mmol/L   CO2 21 20 - 29 mmol/L   Calcium 8.3 (L) 8.7 - 10.3 mg/dL   Total Protein 5.9 (L) 6.0 - 8.5 g/dL   Albumin 3.9 3.8 - 4.8 g/dL   Globulin, Total 2.0 1.5 - 4.5 g/dL   Albumin/Globulin Ratio 2.0 1.2 - 2.2   Bilirubin Total 0.2 0.0 - 1.2  mg/dL   Alkaline Phosphatase 79 39 - 117 IU/L   AST 19 0 - 40 IU/L   ALT 16 0 - 32 IU/L  Hemoglobin A1c  Result Value Ref Range   Hgb A1c MFr Bld 9.2 (H) 4.8 - 5.6 %   Est. average glucose Bld gHb Est-mCnc 217 mg/dL    Assessment & Plan:  1. Dysuria  I have spent 15 minutes of non-face to face time with this patient. I will treat empirically for cystitis as below.   - Urine Culture - cephALEXin (KEFLEX) 500 MG capsule; Take 1 capsule (500 mg total) by mouth 2 (two) times daily for 5 days.  Dispense: 10 capsule; Refill: 0   Follow up PRN.  Carles Collet, PA-C [facility] 03/28/2019, 4:24 PM

## 2019-03-28 NOTE — Telephone Encounter (Signed)
Patient's advised as below. Urine has been dropped off.

## 2019-03-28 NOTE — Addendum Note (Signed)
Addended by: Trinna Post on: 03/28/2019 04:28 PM   Modules accepted: Orders, Level of Service

## 2019-03-28 NOTE — Telephone Encounter (Signed)
Patient calling with c/o UTI symptoms and wants to know if she can bring a urine in? [id number]

## 2019-03-30 ENCOUNTER — Other Ambulatory Visit: Payer: Self-pay | Admitting: Family Medicine

## 2019-03-30 DIAGNOSIS — K219 Gastro-esophageal reflux disease without esophagitis: Secondary | ICD-10-CM

## 2019-03-30 LAB — URINE CULTURE

## 2019-03-31 ENCOUNTER — Telehealth: Payer: Self-pay

## 2019-03-31 NOTE — Telephone Encounter (Signed)
-----   Message from Trinna Post, [place] sent at 03/31/2019  1:33 PM EDT ----- No evidence of urinary tract infection. Can stop antibiotics.

## 2019-03-31 NOTE — Telephone Encounter (Signed)
Pt advised.   Thanks,   -Devone Tousley  

## 2019-04-16 ENCOUNTER — Ambulatory Visit: Payer: Medicare Other | Admitting: Neurology

## 2019-04-16 ENCOUNTER — Encounter: Payer: Self-pay | Admitting: Neurology

## 2019-04-22 ENCOUNTER — Ambulatory Visit (INDEPENDENT_AMBULATORY_CARE_PROVIDER_SITE_OTHER): Payer: Medicare Other

## 2019-04-22 DIAGNOSIS — Z23 Encounter for immunization: Secondary | ICD-10-CM

## 2019-05-02 ENCOUNTER — Telehealth: Payer: Self-pay | Admitting: Family Medicine

## 2019-05-02 NOTE — Telephone Encounter (Signed)
Pt needing to know if she is needing the Shingles shot and pneumonia shot.  Please advise.  Thanks, [employer]

## 2019-05-05 NOTE — Telephone Encounter (Signed)
Pneumovax 23

## 2019-05-05 NOTE — Telephone Encounter (Signed)
Tried calling patient, no answer. Will try again later.  

## 2019-05-05 NOTE — Telephone Encounter (Signed)
Looks like she does need both. Which pneumonia vaccine would be appropriate? Prevnar or pneumovax?

## 2019-05-06 NOTE — Telephone Encounter (Signed)
Advised appt scheduled.

## 2019-05-08 ENCOUNTER — Ambulatory Visit: Payer: Self-pay | Admitting: Family Medicine

## 2019-05-16 ENCOUNTER — Other Ambulatory Visit: Payer: Self-pay | Admitting: Family Medicine

## 2019-05-22 ENCOUNTER — Ambulatory Visit
Admission: RE | Admit: 2019-05-22 | Discharge: 2019-05-22 | Disposition: A | Discharge status: Home / Self Care | Payer: Medicare Other | Source: Ambulatory Visit | Attending: Family Medicine | Admitting: Family Medicine

## 2019-05-22 ENCOUNTER — Other Ambulatory Visit: Payer: Self-pay

## 2019-05-22 ENCOUNTER — Ambulatory Visit (INDEPENDENT_AMBULATORY_CARE_PROVIDER_SITE_OTHER): Payer: Medicare Other | Admitting: Family Medicine

## 2019-05-22 VITALS — BP 152/78 | HR 80 | Temp 97.9°F | Resp 20

## 2019-05-22 DIAGNOSIS — M79604 Pain in right leg: Secondary | ICD-10-CM | POA: Insufficient documentation

## 2019-05-22 DIAGNOSIS — E118 Type 2 diabetes mellitus with unspecified complications: Secondary | ICD-10-CM | POA: Diagnosis not present

## 2019-05-22 DIAGNOSIS — T148XXA Other injury of unspecified body region, initial encounter: Secondary | ICD-10-CM | POA: Diagnosis not present

## 2019-05-22 DIAGNOSIS — S8991XA Unspecified injury of right lower leg, initial encounter: Secondary | ICD-10-CM | POA: Diagnosis not present

## 2019-05-22 DIAGNOSIS — L03115 Cellulitis of right lower limb: Secondary | ICD-10-CM

## 2019-05-22 MED ORDER — HYDROCODONE-ACETAMINOPHEN 10-325 MG PO TABS: 1.0000 | ORAL_TABLET | Freq: Four times a day (QID) | ORAL | 0 refills | Status: DC | PRN

## 2019-05-22 MED ORDER — AMOXICILLIN-POT CLAVULANATE 875-125 MG PO TABS: 1.0000 | ORAL_TABLET | Freq: Two times a day (BID) | ORAL | 0 refills | Status: DC

## 2019-05-22 NOTE — Progress Notes (Signed)
Patient: Susan House Female    DOB: 05/14/1956   63 y.o.   MRN: [id number] Visit Date: 05/22/2019  Today's Provider: Wilhemena Durie, MD   Chief Complaint  Patient presents with  . Fall  . Bleeding/Bruising   Subjective:   HPI Patient comes in today c/o a fall. She has fallen twice since yesterday. She now has bruising on both lower legs. She was getting out of her bed both times.    Allergies  Allergen Reactions  . Morphine And Related Anaphylaxis  . Fentanyl Other (See Comments)    Unknown reaction.  . Reglan [Metoclopramide]     Other reaction(s): Unknown Elevated BP  . Simvastatin Other (See Comments)    Muscle pain  . Betadine [Povidone Iodine] Rash  . Tetracyclines & Related Rash    Other reaction(s): Unknown     Current Outpatient Medications:  .  ACCU-CHEK SOFTCLIX LANCETS lancets, CHECK BLOOD SUGAR THREE TIMES DAILY, Disp: 300 each, Rfl: 3 .  amLODipine (NORVASC) 10 MG tablet, Take 1 tablet (10 mg total) by mouth daily., Disp: 90 tablet, Rfl: 2 .  aspirin 81 MG chewable tablet, Chew 1 tablet (81 mg total) by mouth daily., Disp: , Rfl:  .  atorvastatin (LIPITOR) 20 MG tablet, Take 1 tablet (20 mg total) by mouth daily., Disp: 90 tablet, Rfl: 3 .  Blood Glucose Monitoring Suppl (ACCU-CHEK AVIVA PLUS) w/Device KIT, Check sugar three times daily DX E11.9-needs a meter, Disp: 1 kit, Rfl: 0 .  cloNIDine (CATAPRES) 0.3 MG tablet, Take 1 tablet (0.3 mg total) by mouth 2 (two) times daily., Disp: 180 tablet, Rfl: 3 .  clopidogrel (PLAVIX) 75 MG tablet, Take 1 tablet (75 mg total) by mouth daily., Disp: 90 tablet, Rfl: 3 .  Continuous Blood Gluc Receiver (FREESTYLE LIBRE 14 DAY READER) DEVI, LENGTH OF NEED: LIFETIME - UNLESS SPECIFIED OTHERWISE, Disp: 2 Device, Rfl: 11 .  Continuous Blood Gluc Sensor (FREESTYLE LIBRE 14 DAY SENSOR) MISC, LENGTH OF NEED: LIFETIME - UNLESS SPECIFIED OTHERWISE, Disp: 2 each, Rfl: 0 .  enalapril (VASOTEC) 20 MG tablet, TAKE 1  TABLET BY MOUTH TWICE DAILY, Disp: 180 tablet, Rfl: 3 .  gabapentin (NEURONTIN) 100 MG capsule, Take 200 mg by mouth 3 (three) times daily., Disp: , Rfl:  .  glucose blood (ACCU-CHEK AVIVA PLUS) test strip, TEST BLOOD SUGAR THREE TIMES DAILY, Disp: 300 each, Rfl: 3 .  hydrALAZINE (APRESOLINE) 100 MG tablet, TAKE ONE TABLET BY MOUTH TWICE DAILY, Disp: 180 tablet, Rfl: 2 .  insulin degludec (TRESIBA FLEXTOUCH) 100 UNIT/ML SOPN FlexTouch Pen, Up to 30 units daily., Disp: 3 pen, Rfl: 11 .  Lancet Devices (ACCU-CHEK SOFTCLIX) lancets, Check sugar three times daily DX E11.9, Disp: 100 each, Rfl: 3 .  levETIRAcetam (KEPPRA) 500 MG tablet, Take 1 tablet (500 mg total) by mouth 2 (two) times daily. 500 mg bid., Disp: 180 tablet, Rfl: 3 .  metFORMIN (GLUCOPHAGE) 1000 MG tablet, Take 1 tablet (1,000 mg total) by mouth 2 (two) times daily., Disp: 180 tablet, Rfl: 1 .  metoprolol succinate (TOPROL-XL) 100 MG 24 hr tablet, Take 1 tablet (100 mg total) by mouth daily., Disp: 90 tablet, Rfl: 3 .  mupirocin cream (BACTROBAN) 2 %, Apply 1 application topically daily., Disp: , Rfl:  .  nitroGLYCERIN (NITROSTAT) 0.4 MG SL tablet, Place 1 tablet (0.4 mg total) under the tongue every 5 (five) minutes as needed for chest pain., Disp: 25 tablet, Rfl: 0 .  nystatin cream (MYCOSTATIN), Apply 1 application topically 2 (two) times daily., Disp: 30 g, Rfl: 0 .  ondansetron (ZOFRAN) 4 MG tablet, Take 1 tablet (4 mg total) by mouth every 8 (eight) hours as needed for nausea or vomiting., Disp: 60 tablet, Rfl: 5 .  oxybutynin (DITROPAN) 5 MG tablet, Take 1 tablet (5 mg total) by mouth 2 (two) times daily., Disp: 180 tablet, Rfl: 3 .  pantoprazole (PROTONIX) 40 MG tablet, TAKE 1 TABLET BY MOUTH  DAILY, Disp: 90 tablet, Rfl: 3 .  sulfamethoxazole-trimethoprim (BACTRIM DS) 800-160 MG tablet, Take 1 tablet by mouth 2 (two) times daily. (Patient not taking: Reported on 02/25/2019), Disp: 20 tablet, Rfl: 0 .  traMADol-acetaminophen  (ULTRACET) 37.5-325 MG tablet, Take 1 tablet by mouth every 6 (six) hours as needed., Disp: 30 tablet, Rfl: 0  Review of Systems  Constitutional: Negative for appetite change, chills, fatigue and fever.  Respiratory: Negative for chest tightness and shortness of breath.   Cardiovascular: Negative for chest pain and palpitations.  Gastrointestinal: Negative for abdominal pain, nausea and vomiting.  Endocrine: Negative.   Musculoskeletal: Positive for arthralgias and myalgias.  Allergic/Immunologic: Negative.   Neurological: Negative for dizziness and weakness.  Hematological: Negative.   Psychiatric/Behavioral: Negative.     Social History   Tobacco Use  . Smoking status: Never Smoker  . Smokeless tobacco: Never Used  Substance Use Topics  . Alcohol use: No      Objective:   BP (!) 152/78   Pulse 80   Temp 97.9 F (36.6 C)   Resp 20  Vitals:   05/22/19 1546  BP: (!) 152/78  Pulse: 80  Resp: 20  Temp: 97.9 F (36.6 C)  There is no height or weight on file to calculate BMI.   Physical Exam Vitals signs reviewed.  Constitutional:      Appearance: She is obese.  HENT:     Head: Normocephalic and atraumatic.     Right Ear: External ear normal.     Left Ear: External ear normal.  Eyes:     General: No scleral icterus. Cardiovascular:     Rate and Rhythm: Normal rate and regular rhythm.     Heart sounds: Normal heart sounds.  Pulmonary:     Breath sounds: Normal breath sounds.  Abdominal:     Palpations: Abdomen is soft.  Musculoskeletal:        General: Swelling and tenderness present.     Comments: Moderate size hematoma on the right mid anterior shin.  This area is tender.  .  Much smaller area on the left leg also noted.  Skin:    General: Skin is warm and dry.  Neurological:     Mental Status: She is alert and oriented to person, place, and time.  Psychiatric:        Mood and Affect: Mood normal.        Behavior: Behavior normal.        Thought  Content: Thought content normal.        Judgment: Judgment normal.      No results found for any visits on 05/22/19.     Assessment & Plan    1. Cellulitis of right lower extremity Positive erythema and induration at home.  Does know if it worsens. - amoxicillin-clavulanate (AUGMENTIN) 875-125 MG tablet; Take 1 tablet by mouth 2 (two) times daily.  Dispense: 20 tablet; Refill: 0  2. Pain of right lower extremity I do not think there is a fracture  here. 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  At some point I would like to get the patient to endocrinology for consideration of insulin pump but patient is against this. - POCT HgB A1C--9.4 today More than 50% 25 minute visit spent in counseling or coordination of care  4. Hematoma and contusion I think all the pain is coming from the hematoma.  Use ice for a couple more days and then heat locally     Wilhemena Durie, MD  [place]

## 2019-05-26 ENCOUNTER — Telehealth: Payer: Self-pay | Admitting: Family Medicine

## 2019-05-26 NOTE — Telephone Encounter (Signed)
Advised patient of results. Per Dr. Rosanna Randy, pain will take some time to heal due to the hematoma. Patient is to call back if not improving.

## 2019-05-26 NOTE — Telephone Encounter (Signed)
Notes recorded by Jerrol Banana., MD on 05/24/2019 at 11:10 AM EST  No fracture.

## 2019-05-26 NOTE — Telephone Encounter (Signed)
Pt needing her Xray results.  She also wanted to let Dr. Rosanna Randy know her legs still hurt as well.  Please call her back at [phone].  Thanks, [employer]

## 2019-05-28 LAB — POCT GLYCOSYLATED HEMOGLOBIN (HGB A1C)
Est. average glucose Bld gHb Est-mCnc: 223
Hemoglobin A1C: 9.4 % — AB (ref 4.0–5.6)

## 2019-05-29 ENCOUNTER — Ambulatory Visit (INDEPENDENT_AMBULATORY_CARE_PROVIDER_SITE_OTHER): Payer: Medicare Other | Admitting: Family Medicine

## 2019-05-29 ENCOUNTER — Other Ambulatory Visit: Payer: Self-pay

## 2019-05-29 VITALS — BP 111/54 | HR 60 | Temp 98.2°F | Resp 16 | Ht 62.0 in | Wt 231.0 lb

## 2019-05-29 DIAGNOSIS — L03115 Cellulitis of right lower limb: Secondary | ICD-10-CM | POA: Diagnosis not present

## 2019-05-29 DIAGNOSIS — T148XXA Other injury of unspecified body region, initial encounter: Secondary | ICD-10-CM

## 2019-05-29 NOTE — Progress Notes (Signed)
Patient: Susan House Female    DOB: 11-30-1955   63 y.o.   MRN: [id number] Visit Date: 05/29/2019  Today's Provider: Wilhemena Durie, MD   Chief Complaint  Patient presents with  . Wound Check   Subjective:     Wound Check She was originally treated 10 to 14 days ago. Previous treatment included oral antibiotics. There has been no drainage from the wound. There is new redness present. The swelling has not changed. The pain has worsened. She has no difficulty moving the affected extremity or digit.  She has started the Antibiotic today.   Allergies  Allergen Reactions  . Morphine And Related Anaphylaxis  . Fentanyl Other (See Comments)    Unknown reaction.  . Reglan [Metoclopramide]     Other reaction(s): Unknown Elevated BP  . Simvastatin Other (See Comments)    Muscle pain  . Betadine [Povidone Iodine] Rash  . Tetracyclines & Related Rash    Other reaction(s): Unknown     Current Outpatient Medications:  .  amLODipine (NORVASC) 10 MG tablet, Take 1 tablet (10 mg total) by mouth daily., Disp: 90 tablet, Rfl: 2 .  amoxicillin-clavulanate (AUGMENTIN) 875-125 MG tablet, Take 1 tablet by mouth 2 (two) times daily., Disp: 20 tablet, Rfl: 0 .  aspirin 81 MG chewable tablet, Chew 1 tablet (81 mg total) by mouth daily., Disp: , Rfl:  .  atorvastatin (LIPITOR) 20 MG tablet, Take 1 tablet (20 mg total) by mouth daily., Disp: 90 tablet, Rfl: 3 .  cloNIDine (CATAPRES) 0.3 MG tablet, Take 1 tablet (0.3 mg total) by mouth 2 (two) times daily., Disp: 180 tablet, Rfl: 3 .  clopidogrel (PLAVIX) 75 MG tablet, Take 1 tablet (75 mg total) by mouth daily., Disp: 90 tablet, Rfl: 3 .  enalapril (VASOTEC) 20 MG tablet, TAKE 1 TABLET BY MOUTH TWICE DAILY, Disp: 180 tablet, Rfl: 3 .  gabapentin (NEURONTIN) 100 MG capsule, Take 200 mg by mouth 3 (three) times daily., Disp: , Rfl:  .  glucose blood (ACCU-CHEK AVIVA PLUS) test strip, TEST BLOOD SUGAR THREE TIMES DAILY, Disp: 300  each, Rfl: 3 .  hydrALAZINE (APRESOLINE) 100 MG tablet, TAKE ONE TABLET BY MOUTH TWICE DAILY, Disp: 180 tablet, Rfl: 2 .  levETIRAcetam (KEPPRA) 500 MG tablet, Take 1 tablet (500 mg total) by mouth 2 (two) times daily. 500 mg bid., Disp: 180 tablet, Rfl: 3 .  metFORMIN (GLUCOPHAGE) 1000 MG tablet, Take 1 tablet (1,000 mg total) by mouth 2 (two) times daily., Disp: 180 tablet, Rfl: 1 .  metoprolol succinate (TOPROL-XL) 100 MG 24 hr tablet, Take 1 tablet (100 mg total) by mouth daily., Disp: 90 tablet, Rfl: 3 .  nitroGLYCERIN (NITROSTAT) 0.4 MG SL tablet, Place 1 tablet (0.4 mg total) under the tongue every 5 (five) minutes as needed for chest pain., Disp: 25 tablet, Rfl: 0 .  pantoprazole (PROTONIX) 40 MG tablet, TAKE 1 TABLET BY MOUTH  DAILY, Disp: 90 tablet, Rfl: 3 .  ACCU-CHEK SOFTCLIX LANCETS lancets, CHECK BLOOD SUGAR THREE TIMES DAILY, Disp: 300 each, Rfl: 3 .  Blood Glucose Monitoring Suppl (ACCU-CHEK AVIVA PLUS) w/Device KIT, Check sugar three times daily DX E11.9-needs a meter, Disp: 1 kit, Rfl: 0 .  Continuous Blood Gluc Receiver (FREESTYLE LIBRE 14 DAY READER) DEVI, LENGTH OF NEED: LIFETIME - UNLESS SPECIFIED OTHERWISE, Disp: 2 Device, Rfl: 11 .  Continuous Blood Gluc Sensor (FREESTYLE LIBRE 14 DAY SENSOR) MISC, LENGTH OF NEED: LIFETIME - UNLESS SPECIFIED OTHERWISE,  Disp: 2 each, Rfl: 0 .  HYDROcodone-acetaminophen (NORCO) 10-325 MG tablet, Take 1 tablet by mouth every 6 (six) hours as needed., Disp: 45 tablet, Rfl: 0 .  insulin degludec (TRESIBA FLEXTOUCH) 100 UNIT/ML SOPN FlexTouch Pen, Up to 30 units daily., Disp: 3 pen, Rfl: 11 .  Lancet Devices (ACCU-CHEK SOFTCLIX) lancets, Check sugar three times daily DX E11.9, Disp: 100 each, Rfl: 3 .  mupirocin cream (BACTROBAN) 2 %, Apply 1 application topically daily., Disp: , Rfl:  .  nystatin cream (MYCOSTATIN), Apply 1 application topically 2 (two) times daily. (Patient not taking: Reported on 05/29/2019), Disp: 30 g, Rfl: 0 .  ondansetron  (ZOFRAN) 4 MG tablet, Take 1 tablet (4 mg total) by mouth every 8 (eight) hours as needed for nausea or vomiting., Disp: 60 tablet, Rfl: 5 .  oxybutynin (DITROPAN) 5 MG tablet, Take 1 tablet (5 mg total) by mouth 2 (two) times daily., Disp: 180 tablet, Rfl: 3 .  traMADol-acetaminophen (ULTRACET) 37.5-325 MG tablet, Take 1 tablet by mouth every 6 (six) hours as needed., Disp: 30 tablet, Rfl: 0  Review of Systems  Constitutional: Negative.   Respiratory: Negative.   Cardiovascular: Negative.   Gastrointestinal: Negative.   Endocrine: Negative.   Skin: Positive for wound.  Allergic/Immunologic: Negative.   Psychiatric/Behavioral: Negative.     Social History   Tobacco Use  . Smoking status: Never Smoker  . Smokeless tobacco: Never Used  Substance Use Topics  . Alcohol use: No      Objective:   Ht 5' 2"  (1.575 m)   Wt 231 lb (104.8 kg)   BMI 42.25 kg/m  Vitals:   05/29/19 1029  Weight: 231 lb (104.8 kg)  Height: 5' 2"  (1.575 m)  Body mass index is 42.25 kg/m.   Physical Exam Vitals signs reviewed.  Constitutional:      Appearance: She is obese.  Cardiovascular:     Heart sounds: Normal heart sounds.  Pulmonary:     Breath sounds: Normal breath sounds.  Abdominal:     Palpations: Abdomen is soft.  Skin:    General: Skin is warm and dry.     Comments: Silver dollar size area of mid shin with small hematoma and surrounding erythema.  Neurological:     Mental Status: She is alert and oriented to person, place, and time.  Psychiatric:        Mood and Affect: Mood normal.        Behavior: Behavior normal.        Thought Content: Thought content normal.        Judgment: Judgment normal.      No results found for any visits on 05/29/19.     Assessment & Plan    1. Cellulitis of right lower extremity Apply heating pad and continue taking your antibiotics. Just started abx. 2. Hematoma and contusion Improving--use heating pad.     Richard Cranford Mon, MD   [facility]

## 2019-05-29 NOTE — Patient Instructions (Signed)
Please continue the antibiotic as prescribed. Contact our office if things start to worsen or you experience any side effects. If the symptoms become severe please go to the ED. Use Warm compresses on the area or a covered heating pad.

## 2019-05-30 ENCOUNTER — Other Ambulatory Visit: Payer: Self-pay | Admitting: Family Medicine

## 2019-05-30 DIAGNOSIS — I10 Essential (primary) hypertension: Secondary | ICD-10-CM

## 2019-06-02 ENCOUNTER — Other Ambulatory Visit: Payer: Self-pay | Admitting: Family Medicine

## 2019-06-02 DIAGNOSIS — I10 Essential (primary) hypertension: Secondary | ICD-10-CM

## 2019-06-02 MED ORDER — ENALAPRIL MALEATE 20 MG PO TABS: 20.0000 mg | ORAL_TABLET | Freq: Two times a day (BID) | ORAL | 3 refills | Status: DC

## 2019-06-02 NOTE — Telephone Encounter (Signed)
[facility] faxed refill request for the following medications:  enalapril (VASOTEC) 20 MG tablet   Please advise.  Thanks, [employer]

## 2019-06-03 ENCOUNTER — Ambulatory Visit (INDEPENDENT_AMBULATORY_CARE_PROVIDER_SITE_OTHER): Payer: Medicare Other | Admitting: Family Medicine

## 2019-06-03 ENCOUNTER — Other Ambulatory Visit: Payer: Self-pay

## 2019-06-03 VITALS — BP 138/58 | HR 69 | Temp 98.1°F | Resp 16

## 2019-06-03 DIAGNOSIS — T148XXA Other injury of unspecified body region, initial encounter: Secondary | ICD-10-CM

## 2019-06-03 DIAGNOSIS — L03115 Cellulitis of right lower limb: Secondary | ICD-10-CM | POA: Diagnosis not present

## 2019-06-03 NOTE — Progress Notes (Signed)
Patient: Susan House Female    DOB: [birth year]   63 y.o.   MRN: [id number] Visit Date: 06/03/2019  Today's Provider: Vernie Murders, PA   Chief Complaint  Patient presents with  . Wound Check   Subjective:    Cellulitis of right lower extremity Has been using heating pad Started the abx 05/29/19 and has been taking it daily. Hematoma and contusion Improving--use heating pad.  Wound Check She was originally treated more than 14 days ago. Previous treatment included oral antibiotics. There has been no drainage from the wound. The redness has worsened. The swelling has worsened. The pain has worsened. There is difficulty moving the extremity or digit due to pain.   Past Medical History:  Diagnosis Date  . Allergy   . Arthritis   . Atypical chest pain    a. Reportedly nl cath in the past; b. 12/2009 MV: No ischemia/infarct. EF 87%.  . Carotid arterial disease ([place])    a. 03/4502 CTA head/neck: LICA 88E, RICA 28-->[facility] rx.  . Cellulitis and abscess of face   . Chronic Dizziness   . Coronary artery calcification seen on CT scan    a. 10/2016 CTA chest: Atherosclerotic calcifications Ao, cors, and prox great vessels.  . Depression   . Diabetes mellitus ([place])   . Diastolic dysfunction    a. 2008 Echo: Nl EF; b. 2011 Echo: Nl EF; c. 06/2016 Echo: EF 55-60%, Gr1DD; d. 03/2018 Echo: EF 55-60%, no rwma, Gr1DD, mod dil LA.  . Edema   . GERD (gastroesophageal reflux disease)   . Headache   . Hematuria   . Hyperlipidemia   . Hypertension   . IBS (irritable bowel syndrome)   . Migraine   . Morbid obesity ([place])   . Nocturia   . Possible Seizure ([place])    a. 10/2016 ? sz. MRI neg for stroke.  . Stroke ([place]) 2000  . TIA (transient ischemic attack) 2010 & 2019   a. 2010; b. 03/2018 Left sided wkns-->MRI neg for stroke. CTA head/neck: Right A2 cut off/occlusion w/ collats extending over R cerebral convexity. LICA 49Z, RICA 45, RSubclavian 50->med rx.  . Urgency of  micturation   . Urinary frequency   . Urinary incontinence    Past Surgical History:  Procedure Laterality Date  . ABDOMINAL HYSTERECTOMY    . ABDOMINOPLASTY    . APPENDECTOMY    . BACK SURGERY     extensive spinal fusion  . carpel tunnel surgery    . CATARACT EXTRACTION     x 2  . CESAREAN SECTION    . CHOLECYSTECTOMY    . COLONOSCOPY  2010  . COLONOSCOPY WITH PROPOFOL N/A 07/11/2016   Procedure: COLONOSCOPY WITH PROPOFOL;  Surgeon: Lucilla Lame, MD;  Location: [facility] ENDOSCOPY;  Service: Endoscopy;  Laterality: N/A;  . COLONOSCOPY WITH PROPOFOL N/A 03/26/2018   Procedure: COLONOSCOPY WITH PROPOFOL;  Surgeon: Lucilla Lame, MD;  Location: [facility] ENDOSCOPY;  Service: Endoscopy;  Laterality: N/A;  . ESOPHAGOGASTRODUODENOSCOPY (EGD) WITH PROPOFOL N/A 03/26/2018   Procedure: ESOPHAGOGASTRODUODENOSCOPY (EGD) WITH PROPOFOL;  Surgeon: Lucilla Lame, MD;  Location: [facility] ENDOSCOPY;  Service: Endoscopy;  Laterality: N/A;  . EYE SURGERY    . HERNIA REPAIR    . SHOULDER SURGERY    . TOE SURGERY    . UPPER GI ENDOSCOPY    . WRIST FRACTURE SURGERY Left    Family History  Problem Relation Age of Onset  . Hyperlipidemia Mother   . Arrhythmia  Mother        WPW  . Hypertension Mother   . Rheum arthritis Mother   . Heart disease Mother   . Heart attack Father 65  . Hyperlipidemia Father   . Hypertension Father   . Stroke Father   . Diabetes Father   . Heart disease Father   . Coronary artery disease Father   . Diabetes Sister   . Hyperlipidemia Sister   . Hypertension Sister   . Depression Sister   . Depression Sister   . Diabetes Sister   . Hypertension Sister   . Hyperlipidemia Sister   . Kidney disease Paternal Grandmother    Allergies  Allergen Reactions  . Morphine And Related Anaphylaxis  . Fentanyl Other (See Comments)    Unknown reaction.  . Reglan [Metoclopramide]     Other reaction(s): Unknown Elevated BP  . Simvastatin Other (See Comments)    Muscle pain  . Betadine  [Povidone Iodine] Rash  . Tetracyclines & Related Rash    Other reaction(s): Unknown    Current Outpatient Medications:  .  ACCU-CHEK SOFTCLIX LANCETS lancets, CHECK BLOOD SUGAR THREE TIMES DAILY, Disp: 300 each, Rfl: 3 .  amLODipine (NORVASC) 10 MG tablet, TAKE 1 TABLET BY MOUTH  DAILY, Disp: 90 tablet, Rfl: 3 .  amoxicillin-clavulanate (AUGMENTIN) 875-125 MG tablet, Take 1 tablet by mouth 2 (two) times daily., Disp: 20 tablet, Rfl: 0 .  aspirin 81 MG chewable tablet, Chew 1 tablet (81 mg total) by mouth daily., Disp: , Rfl:  .  atorvastatin (LIPITOR) 20 MG tablet, Take 1 tablet (20 mg total) by mouth daily., Disp: 90 tablet, Rfl: 3 .  Blood Glucose Monitoring Suppl (ACCU-CHEK AVIVA PLUS) w/Device KIT, Check sugar three times daily DX E11.9-needs a meter, Disp: 1 kit, Rfl: 0 .  cloNIDine (CATAPRES) 0.3 MG tablet, Take 1 tablet (0.3 mg total) by mouth 2 (two) times daily., Disp: 180 tablet, Rfl: 3 .  clopidogrel (PLAVIX) 75 MG tablet, Take 1 tablet (75 mg total) by mouth daily., Disp: 90 tablet, Rfl: 3 .  Continuous Blood Gluc Receiver (FREESTYLE LIBRE 14 DAY READER) DEVI, LENGTH OF NEED: LIFETIME - UNLESS SPECIFIED OTHERWISE, Disp: 2 Device, Rfl: 11 .  Continuous Blood Gluc Sensor (FREESTYLE LIBRE 14 DAY SENSOR) MISC, LENGTH OF NEED: LIFETIME - UNLESS SPECIFIED OTHERWISE, Disp: 2 each, Rfl: 0 .  enalapril (VASOTEC) 20 MG tablet, Take 1 tablet (20 mg total) by mouth 2 (two) times daily., Disp: 180 tablet, Rfl: 3 .  gabapentin (NEURONTIN) 100 MG capsule, Take 200 mg by mouth 3 (three) times daily., Disp: , Rfl:  .  glucose blood (ACCU-CHEK AVIVA PLUS) test strip, TEST BLOOD SUGAR THREE TIMES DAILY, Disp: 300 each, Rfl: 3 .  hydrALAZINE (APRESOLINE) 100 MG tablet, TAKE ONE TABLET BY MOUTH TWICE DAILY, Disp: 180 tablet, Rfl: 2 .  HYDROcodone-acetaminophen (NORCO) 10-325 MG tablet, Take 1 tablet by mouth every 6 (six) hours as needed., Disp: 45 tablet, Rfl: 0 .  insulin degludec (TRESIBA FLEXTOUCH)  100 UNIT/ML SOPN FlexTouch Pen, Up to 30 units daily., Disp: 3 pen, Rfl: 11 .  Lancet Devices (ACCU-CHEK SOFTCLIX) lancets, Check sugar three times daily DX E11.9, Disp: 100 each, Rfl: 3 .  levETIRAcetam (KEPPRA) 500 MG tablet, Take 1 tablet (500 mg total) by mouth 2 (two) times daily. 500 mg bid., Disp: 180 tablet, Rfl: 3 .  metFORMIN (GLUCOPHAGE) 1000 MG tablet, Take 1 tablet (1,000 mg total) by mouth 2 (two) times daily., Disp: 180 tablet, Rfl:  1 .  metoprolol succinate (TOPROL-XL) 100 MG 24 hr tablet, Take 1 tablet (100 mg total) by mouth daily., Disp: 90 tablet, Rfl: 3 .  mupirocin cream (BACTROBAN) 2 %, Apply 1 application topically daily., Disp: , Rfl:  .  nitroGLYCERIN (NITROSTAT) 0.4 MG SL tablet, Place 1 tablet (0.4 mg total) under the tongue every 5 (five) minutes as needed for chest pain., Disp: 25 tablet, Rfl: 0 .  nystatin cream (MYCOSTATIN), Apply 1 application topically 2 (two) times daily. (Patient not taking: Reported on 05/29/2019), Disp: 30 g, Rfl: 0 .  ondansetron (ZOFRAN) 4 MG tablet, Take 1 tablet (4 mg total) by mouth every 8 (eight) hours as needed for nausea or vomiting., Disp: 60 tablet, Rfl: 5 .  oxybutynin (DITROPAN) 5 MG tablet, Take 1 tablet (5 mg total) by mouth 2 (two) times daily., Disp: 180 tablet, Rfl: 3 .  pantoprazole (PROTONIX) 40 MG tablet, TAKE 1 TABLET BY MOUTH  DAILY, Disp: 90 tablet, Rfl: 3 .  traMADol-acetaminophen (ULTRACET) 37.5-325 MG tablet, Take 1 tablet by mouth every 6 (six) hours as needed., Disp: 30 tablet, Rfl: 0  Review of Systems  Constitutional: Negative.   HENT: Negative.   Respiratory: Negative.   Cardiovascular: Negative.   Skin: Positive for wound.    Social History   Tobacco Use  . Smoking status: Never Smoker  . Smokeless tobacco: Never Used  Substance Use Topics  . Alcohol use: No     Objective:   BP (!) 138/58   Pulse 69   Temp 98.1 F (36.7 C) (Temporal)   Resp 16   SpO2 99%  Vitals:   06/03/19 0904  BP: (!)  138/58  Pulse: 69  Resp: 16  Temp: 98.1 F (36.7 C)  TempSrc: Temporal  SpO2: 99%   Physical Exam Constitutional:      General: She is not in acute distress.    Appearance: She is well-developed.  HENT:     Head: Normocephalic and atraumatic.     Right Ear: Hearing normal.     Left Ear: Hearing normal.     Nose: Nose normal.  Eyes:     General: Lids are normal. No scleral icterus.       Right eye: No discharge.        Left eye: No discharge.     Conjunctiva/sclera: Conjunctivae normal.  Pulmonary:     Effort: Pulmonary effort is normal. No respiratory distress.  Musculoskeletal:     Comments: Symmetric pedal pulses. Tender 3 cm hematoma with erythema and a couple scabs on the right shin from a fall against the bedside commode on 05-21-19. Also, a 2 cm hematoma with minor erythema medial to the left shin without much discomfort.   Skin:    Findings: Erythema present. No lesion.  Neurological:     Mental Status: She is alert and oriented to person, place, and time.  Psychiatric:        Speech: Speech normal.        Behavior: Behavior normal.        Thought Content: Thought content normal.       Assessment & Plan    1. Hematoma and contusion Tender hematoma on the right shin with cellulitis. Left anterior lower leg with smaller hematoma. These occurred on 05-21-19 after a fall. Good pedal pulses. Slow healing. Will recheck in a week.  2. Cellulitis of right lower extremity No fever but redness persists over hematomas with significant tenderness on the right. Continue Augmentin (  did not start this until 4-5 days ago) and moist heat applications. May use a little Hydrocortisone 1-2% 2-3 times a day for redness and tenderness. Recheck in 1 week.     Vernie Murders, PA  [facility]

## 2019-06-09 ENCOUNTER — Telehealth: Payer: Self-pay

## 2019-06-09 ENCOUNTER — Other Ambulatory Visit: Payer: Self-pay

## 2019-06-09 ENCOUNTER — Ambulatory Visit (INDEPENDENT_AMBULATORY_CARE_PROVIDER_SITE_OTHER): Payer: Medicare Other | Admitting: Family Medicine

## 2019-06-09 VITALS — BP 114/64 | HR 69 | Temp 96.6°F

## 2019-06-09 DIAGNOSIS — T148XXA Other injury of unspecified body region, initial encounter: Secondary | ICD-10-CM | POA: Diagnosis not present

## 2019-06-09 DIAGNOSIS — R110 Nausea: Secondary | ICD-10-CM | POA: Diagnosis not present

## 2019-06-09 DIAGNOSIS — L03115 Cellulitis of right lower limb: Secondary | ICD-10-CM | POA: Diagnosis not present

## 2019-06-09 DIAGNOSIS — N362 Urethral caruncle: Secondary | ICD-10-CM

## 2019-06-09 MED ORDER — ONDANSETRON HCL 4 MG PO TABS: 4.0000 mg | ORAL_TABLET | Freq: Three times a day (TID) | ORAL | 5 refills | Status: AC | PRN

## 2019-06-09 MED ORDER — ESTROGENS, CONJUGATED 0.625 MG/GM VA CREA: TOPICAL_CREAM | VAGINAL | 1 refills | Status: DC

## 2019-06-09 NOTE — Telephone Encounter (Signed)
Copied from [place] [id number]. Topic: General - Inquiry >> Jun 09, 2019  2:26 PM Susan House wrote: Reason for CRM: Patient had an appt with Chrismon this morning, regarding a wound.  Patient states she is now is having a cramping and feeling nauseas. Call back [phone]
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
## 2019-06-09 NOTE — Telephone Encounter (Signed)
I have discussed this with Simona Huh and he feels that the exam this morning may be causing some of the cramping which may in turn lead to a little nausea.  Will send some Zofran to the pharmacy.  Patient is to let us know if she develops any bowel or urinary symptoms with this.  RTC if does not improve.

## 2019-06-10 DIAGNOSIS — B351 Tinea unguium: Secondary | ICD-10-CM | POA: Diagnosis not present

## 2019-06-10 DIAGNOSIS — E1140 Type 2 diabetes mellitus with diabetic neuropathy, unspecified: Secondary | ICD-10-CM | POA: Diagnosis not present

## 2019-06-10 DIAGNOSIS — L851 Acquired keratosis [keratoderma] palmaris et plantaris: Secondary | ICD-10-CM | POA: Diagnosis not present

## 2019-06-17 ENCOUNTER — Telehealth: Payer: Self-pay | Admitting: Family Medicine

## 2019-06-17 NOTE — Telephone Encounter (Signed)
Pt. ret'd call and reported the right lower leg redness has increased by about 1/2 ", since eval. one week ago; and reported she has noticed increased warmth and discomfort.  Reported the increased pain occurs when laying on it, and  repositioning.  C/o chills today, but has not checked temperature.  Advised of Dr. Alben Spittle recommendation to apply Bacitracin BID, and to elevate the leg.  Reported she has already been applying Bacitracin to site.  Reported she completed her oral antibiotic on Sat.   Called PCP office; spoke with Arbie Cookey.  Will send note to the office for review and further recommendation.  Pt. Advised of plan.  Encouraged to continue to follow recommendations by Dr. Rosanna Randy, and advised the office will call with any further advice.  Verb. Understanding.

## 2019-06-17 NOTE — Telephone Encounter (Signed)
From [facility] 

## 2019-06-17 NOTE — Telephone Encounter (Signed)
Pt states that Dr. Rosanna Randy has been watching a wound on her right leg and today is is red and has a bit of heat to it.  Pt wants to know what Dr. Rosanna Randy would suggest she do.

## 2019-06-17 NOTE — Telephone Encounter (Signed)
Try topical Bacitracin BID over area with skin breaks.Geoffry Paradise. Elevate leg also.

## 2019-06-18 MED ORDER — CEPHALEXIN 500 MG PO CAPS: 500.0000 mg | ORAL_CAPSULE | Freq: Two times a day (BID) | ORAL | 0 refills | Status: DC

## 2019-06-18 NOTE — Telephone Encounter (Signed)
Patient advised.

## 2019-06-18 NOTE — Addendum Note (Signed)
Addended by: Julieta Bellini on: 06/18/2019 12:19 PM   Modules accepted: Orders

## 2019-06-18 NOTE — Telephone Encounter (Signed)
Try Keflex 500mg  BID for 1 week.

## 2019-06-18 NOTE — Telephone Encounter (Signed)
Please advise 

## 2019-06-18 NOTE — Telephone Encounter (Signed)
Rx sent to pharmacy   

## 2019-06-19 ENCOUNTER — Other Ambulatory Visit: Payer: Self-pay | Admitting: Family Medicine

## 2019-06-19 DIAGNOSIS — M5136 Other intervertebral disc degeneration, lumbar region: Secondary | ICD-10-CM | POA: Diagnosis not present

## 2019-06-19 DIAGNOSIS — M48062 Spinal stenosis, lumbar region with neurogenic claudication: Secondary | ICD-10-CM | POA: Diagnosis not present

## 2019-06-19 DIAGNOSIS — M5416 Radiculopathy, lumbar region: Secondary | ICD-10-CM | POA: Diagnosis not present

## 2019-06-19 NOTE — Telephone Encounter (Signed)
Medication Refill - Medication:  HYDROcodone-acetaminophen (NORCO) 10-325 MG tablet   Has the patient contacted their pharmacy? Yes advised to call office.   Preferred Pharmacy (with phone number or street name):  [id number] [address], [place] [phone] (Phone) [phone] (Fax)     Agent: Please be advised that RX refills may take up to 3 business days. We ask that you follow-up with your pharmacy.

## 2019-06-19 NOTE — Telephone Encounter (Signed)
From [facility] 

## 2019-06-20 ENCOUNTER — Ambulatory Visit: Payer: Self-pay

## 2019-06-20 ENCOUNTER — Telehealth: Payer: Self-pay | Admitting: *Deleted

## 2019-06-20 MED ORDER — HYDROCODONE-ACETAMINOPHEN 10-325 MG PO TABS: 1.0000 | ORAL_TABLET | Freq: Four times a day (QID) | ORAL | 0 refills | Status: DC | PRN

## 2019-06-20 NOTE — Telephone Encounter (Signed)
See other from today, patient was advise.

## 2019-06-20 NOTE — Telephone Encounter (Signed)
Because it is end of day and if she is still having dizziness and elevated sugars she may need to go to ER for further evaluation.

## 2019-06-20 NOTE — Telephone Encounter (Signed)
Requested Prescriptions  Pending Prescriptions Disp Refills  . atorvastatin (LIPITOR) 20 MG tablet [Pharmacy Med Name: ATORVASTATIN  20MG   TAB] 90 tablet 3    Sig: TAKE 1 TABLET BY MOUTH  DAILY     Cardiovascular:  Antilipid - Statins Failed - 06/19/2019 10:54 PM      Failed - Total Cholesterol in normal range and within 360 days    Cholesterol  Date Value Ref Range Status  04/01/2018 102 0 - 200 mg/dL Final  01/18/2014 110 0 - 200 mg/dL Final         Failed - LDL in normal range and within 360 days    Ldl Cholesterol, Calc  Date Value Ref Range Status  01/18/2014 51 0 - 100 mg/dL Final   LDL Cholesterol  Date Value Ref Range Status  04/01/2018 33 0 - 99 mg/dL Final    Comment:           Total Cholesterol/HDL:CHD Risk Coronary Heart Disease Risk Table                     Men   Women  1/2 Average Risk   3.4   3.3  Average Risk       5.0   4.4  2 X Average Risk   9.6   7.1  3 X Average Risk  23.4   11.0        Use the calculated Patient Ratio above and the CHD Risk Table to determine the patient's CHD Risk.        ATP III CLASSIFICATION (LDL):  <100     mg/dL   Optimal  100-129  mg/dL   Near or Above                    Optimal  130-159  mg/dL   Borderline  160-189  mg/dL   High  >190     mg/dL   Very High Performed at [facility], [address] [place], [place] [address]          Failed - HDL in normal range and within 360 days    HDL Cholesterol  Date Value Ref Range Status  01/18/2014 35 (L) 40 - 60 mg/dL Final   HDL  Date Value Ref Range Status  04/01/2018 29 (L) >40 mg/dL Final         Failed - Triglycerides in normal range and within 360 days    Triglycerides  Date Value Ref Range Status  04/01/2018 198 (H) <150 mg/dL Final  01/18/2014 122 0 - 200 mg/dL Final         Passed - Patient is not pregnant      Passed - Valid encounter within last 12 months    Recent Outpatient Visits          1 week ago Hematoma and contusion   [place], [place], [place]   2 weeks ago Hematoma and contusion   [place], [place]   3 weeks ago Cellulitis of right lower extremity   [facility] Jerrol Banana., MD   4 weeks ago Cellulitis of right lower extremity   [facility] Jerrol Banana., MD   3 months ago [place], Fabio Bering [place], [place]

## 2019-06-20 NOTE — Telephone Encounter (Signed)
Patient advised as directed below. 

## 2019-06-20 NOTE — Telephone Encounter (Signed)
Called patient concerning triage call from today. Fenton Malling, PA is advising patient go to ER for evaluation. Tried calling patient several times, but line is busy.

## 2019-06-20 NOTE — Telephone Encounter (Signed)
Pt. Called to report episode of dizziness this AM when she got up from her bed and walked down hallway to front room.  Reported she did not feel faint, but was unsteady, and had to be helped by husband. Also, reported intermittent "sharp, shooting pain" in right forehead, that comes and goes over past 2 days.  Stated she had this pain today during the episode of dizziness.  Denied blurred vision.  Denied feeling heart palpitations.  Denied chest discomfort.  Pt. Checked pulse during call; reported pulse rate 88 and regular.  Stated she has had dizziness in the past, but this has not occurred for a long time, until this morning.  Reported she has drank one quart of water so far today, since she got up.    Phone call to [facility].  Spoke with Arbie Cookey in office. Was advised that a nurse will call the patient back.   Advised the pt. That the office will call her back re: her dizziness.  Encouraged to call back if she has not heard back by 3:30 PM. The pt. stated "I forgot to tell you that my blood sugar was 351 this morning, at about 7:00 or 8:00, and I took an additional 3 units of Tresiba Insulin, for a total of 23 units."   Requested pt. To recheck her blood sugar at this time; blood sugar 318 @ 1:38 PM.  Pt. Advised to drink water to assist with bringing her blood sugar down  Advised to change position slowly, to avoid increased dizziness.  Verb. Understanding.      Reason for Disposition . [1] MODERATE dizziness (e.g., interferes with normal activities) AND [2] has NOT been evaluated by physician for this  (Exception: dizziness caused by heat exposure, sudden standing, or poor fluid intake)  Answer Assessment - Initial Assessment Questions 1. DESCRIPTION: "Describe your dizziness."     Reported dizziness when she sat up on the bed this morning when walking down the hallway; felt like head was going around; over 3-4 minutes   2. LIGHTHEADED: "Do you feel lightheaded?" (e.g., somewhat faint, woozy, weak upon  standing)     Denied feeling faint 3. VERTIGO: "Do you feel like either you or the room is spinning or tilting?" (i.e. vertigo)     Denied  4. SEVERITY: "How bad is it?"  "Do you feel like you are going to faint?" "Can you stand and walk?"   - MILD - walking normally   - MODERATE - interferes with normal activities (e.g., work, school)    - SEVERE - unable to stand, requires support to walk, feels like passing out now.      moderate 5. ONSET:  "When did the dizziness begin?"     About 6:30 AM  6. AGGRAVATING FACTORS: "Does anything make it worse?" (e.g., standing, change in head position)    Reported that sometimes the dizziness can happen if she changes position too fast, and feels off balance  7. HEART RATE: "Can you tell me your heart rate?" "How many beats in 15 seconds?"  (Note: not all patients can do this)       88 and irreg 8. CAUSE: "What do you think is causing the dizziness?"     *No Answer* 9. RECURRENT SYMPTOM: "Have you had dizziness before?" If so, ask: "When was the last time?" "What happened that time?"     *No Answer* 10. OTHER SYMPTOMS: "Do you have any other symptoms?" (e.g., fever, chest pain, vomiting, diarrhea, bleeding)  Denied blurred vision; denied chest pain or heart palpitations.  Reported has had headache like a sharp shooting pain on right forehead, that comes and goes over past 2 days.  11. PREGNANCY: "Is there any chance you are pregnant?" "When was your last menstrual period?"       n/a  Protocols used: DIZZINESS [facility]

## 2019-07-03 ENCOUNTER — Ambulatory Visit (INDEPENDENT_AMBULATORY_CARE_PROVIDER_SITE_OTHER): Payer: Medicare Other | Admitting: Neurology

## 2019-07-03 ENCOUNTER — Other Ambulatory Visit: Payer: Self-pay

## 2019-07-03 ENCOUNTER — Encounter: Payer: Self-pay | Admitting: Neurology

## 2019-07-03 VITALS — BP 141/76 | HR 66 | Temp 97.2°F | Ht 61.0 in | Wt 232.0 lb

## 2019-07-03 DIAGNOSIS — I69354 Hemiplegia and hemiparesis following cerebral infarction affecting left non-dominant side: Secondary | ICD-10-CM

## 2019-07-03 NOTE — Patient Instructions (Signed)
Preventing Cerebrovascular Disease  Arteries are blood vessels that carry blood that contains oxygen from the heart to all parts of the body. Cerebrovascular disease affects arteries that supply the brain. Any condition that blocks or disrupts blood flow to the brain can cause cerebrovascular disease. Brain cells that lose blood supply start to die within minutes (stroke). Stroke is the main danger of cerebrovascular disease. Atherosclerosis and high blood pressure are common causes of cerebrovascular disease. Atherosclerosis is narrowing and hardening of an artery that results when fat, cholesterol, calcium, or other substances (plaque) build up inside an artery. Plaque reduces blood flow through the artery. High blood pressure increases the risk of bleeding inside the brain. Making diet and lifestyle changes to prevent atherosclerosis and high blood pressure lowers your risk of cerebrovascular disease. What nutrition changes can be made?  Eat more fruits, vegetables, and whole grains.  Reduce how much saturated fat you eat. To do this, eat less red meat and fewer full-fat dairy products.  Eat healthy proteins instead of red meat. Healthy proteins include: ? Fish. Eat fish that contains heart-healthy omega-3 fatty acids, twice a week. Examples include salmon, albacore tuna, mackerel, and herring. ? Chicken. ? Nuts. ? Low-fat or nonfat yogurt.  Avoid processed meats, like bacon and lunchmeat.  Avoid foods that contain: ? A lot of sugar, such as sweets and drinks with added sugar. ? A lot of salt (sodium). Avoid adding extra salt to your food, as told by your health care provider. ? Trans fats, such as margarine and baked goods. Trans fats may be listed as "partially hydrogenated oils" on food labels.  Check food labels to see how much sodium, sugar, and trans fats are in foods.  Use vegetable oils that contain low amounts of saturated fat, such as olive oil or canola oil. What lifestyle  changes can be made?  Drink alcohol in moderation. This means no more than 1 drink a day for nonpregnant women and 2 drinks a day for men. One drink equals 12 oz of beer, 5 oz of wine, or 1 oz of hard liquor.  If you are overweight, ask your health care provider to recommend a weight-loss plan for you. Losing 5-10 lb (2.2-4.5 kg) can reduce your risk of diabetes, atherosclerosis, and high blood pressure.  Exercise for 30?60 minutes on most days, or as much as told by your health care provider. ? Do moderate-intensity exercise, such as brisk walking, bicycling, and water aerobics. Ask your health care provider which activities are safe for you.   Do not use any products that contain nicotine or tobacco, such as cigarettes and e-cigarettes. If you need help quitting, ask your health care provider. Why are these changes important? Making these changes lowers your risk of many diseases that can cause cerebrovascular disease and stroke. Stroke is a leading cause of death and disability. Making these changes also improves your overall health and quality of life. What can I do to lower my risk? The following factors make you more likely to develop cerebrovascular disease:  Being overweight.  Smoking.  Being physically inactive.  Eating a high-fat diet.  Having certain health conditions, such as: ? Diabetes. ? High blood pressure. ? Heart disease. ? Atherosclerosis. ? High cholesterol. ? Sickle cell disease.  Talk with your health care provider about your risk for cerebrovascular disease. Work with your health care provider to control diseases that you have that may contribute to cerebrovascular disease. Your health care provider may prescribe medicines to  help prevent major causes of cerebrovascular disease. Where to find more information Learn more about preventing cerebrovascular disease from:  [place], and [place]:  [employer]  Centers for Disease Control and Prevention: http://www.curry-wood.biz/ Summary  Cerebrovascular disease can lead to a stroke.  Atherosclerosis and high blood pressure are major causes of cerebrovascular disease.  Making diet and lifestyle changes can reduce your risk of cerebrovascular disease.  Work with your health care provider to get your risk factors under control to reduce your risk of cerebrovascular disease. This information is not intended to replace advice given to you by your health care provider. Make sure you discuss any questions you have with your health care provider. Document Released: 07/18/2015 Document Revised: 06/15/2017 Document Reviewed: 07/18/2015 Elsevier Patient Education  2020 [employer].

## 2019-07-03 NOTE — Progress Notes (Signed)
PATIENT: Susan House DOB: 02-04-1956  REASON FOR VISIT: follow up- TIA , hospitalization for acute worsening- stroke, restless leg syndrome, left hemiparesis HISTORY FROM: patient and husband   HISTORY OF PRESENT ILLNESS:  [id number] The patient is now 26 here with husband, who mentioned that she had likely some TIA in June 2020, followed by a phone conversation with our Stroke Team NP. The patient has been isolating but goes to church in person, her son is a Environmental education officer.  The patient has had a negative progression of left leg weakness, but not sudden- slowly, and related to low back pain. She has been given shots but these don't help much. Her speech remained slurred.  GDS 1/ 15 , no sleepiness core of fatigue score was obtained. All her meds are refilled through PCP she advised me. "Nothing new ". She still walks but watches out more, the leg may give out - Not painful.  Sleep is good now,      04-30-2018, I have the pleasure of meeting today with [facility] A. Aris Georgia, and her husband . Susan House has a history of progressive small vessel disease, she was admitted to [facility] on 31 March 2018 and stayed overnight she was discharged on the 17th.Mr. Susan House states that his wife had slurred speech and seemed to have worsening of left arm and leg weakness that was noted around 11 AM of the morning of 15 September.  She felt very sluggish went to church continued to feel worse.  All of a sudden her left leg got very weak-  she was unable to lift it off the ground.  Also, these changes lasted only 24 hours- and she was not confused.    It appears that she presented with slurred speech in the setting of previous strokes, hypertension, hyperlipidemia and diabetes mellitus.  She underwent an MRI of the head which did not show any new stroke, no acute infarct or hemorrhage or mass-effect.  Old left frontal lobe infarct and multiple ganglia thalamic lacunar infarcts noted.   None of them fresh.  There is ex vacuo dilation of the left lateral ventricle and generalized age advanced atrophy of the brain with microvascular disease, she was seen by tele-neurology, the ED had Landry Mellow called a code stroke.  She also underwent an carotid studies and her right carotid bifurcation shows an estimated 50 to 70% proximal ICA stenosis velocity meet criteria for a greater than 70% stenosis though.  The interpreting radiologist Dr. Aletta Edouard, appreciated a stable appearance of moderate plaque at the right carotid bifurcation.  There was also a stable appearance of the plaque at the left carotid bifurcation here with less than 50% stenosis.  Blood chemistry showed nonfasting glucose level of 183, BUN 27, creatinine 1.12, sodium 141 potassium 3.9 hemoglobin 11.2 hematocrit 33.1.  Muscle strength in her left upper and lower extremities is reduced to 2 out of 5 historically but he has 5 out of 5 in her right extremities.  The slurred speech is still noted today also neither she nor her husband are sure if it is actually improved or if it was absolutely At the day of her presentation.  We do not have any evidence of a new stroke so at baseline this may have been a TIA or could have been an effect of a seizure?  She was not extremely hyperglycemic, her blood pressure was not out of control, she was not on any medications that confused her. She  Had Pt at home and she is not back to baseline stated her husband- and she insists she is back to baseline. Her speech seems still very different from her baseline.   TIA - transient event over 24 hours or less,  5 days after colonoscopy and being for 6 days off Plavix- ,  history of left hemispheric stroke in 2000, hemiplegia work up  at [facility]-  .   New spell of left sided weakness- slurring of speech. Had diarrhea in the month of August- September and lost 25 pounds . She restarted on plavix and D/C  ASA 81 mg in March 2019.  She was  taken off Plavix in preparation for colonoscopy- 03-26-2018, and resumed the medications on or after 03-28-2018.  History of stroke with enkephalomalacia. New MRI confirmed yet again no new stroke. Possible  TIA-    A) Uncontrolled blood pressures on multiple medication-  Has not had renal artery stenosis work up yet,she reports-  ECHO normal.carotid doppler to be reviewwd by STROKE MD. Has remote lacunar strokes.  Marland Kitchen  B) DM running high- and has been uncontrolled for years .        Zeina Akkerman Lisia Westbay is seen on 10 October 2017, the patient is well established with our practice.  She was seen at [facility] on 04 October 2017 with evolving left-sided hemiparesis, was unable to stand and according to her husband felt weaker in her predisposed left side.  She has a history of removal remote lacunar infarcts in the internal capsule, basal ganglia has a small amount of chronic blood products.  The MRI MRA of the head did not reveal a new stroke and was compared to a prior CT from 03 October 2017 the day of admission.   All MRA related large vessels were patent.  She had some atheromatous irregularities in the flow of the MCA branches, ophthalmic arteries were normal, there is mild distal small vessel disease noted.  She had her first stroke about 19 years ago which initially left her with significant weakness in the left upper and lower extremities, she underwent extensive rehab and subsequently her strength had improved.   However in the morning hours of 20 March she started having worsening weakness of the left upper and lower extremity as well as slurred speech.  After the MRI did not show an acute stroke the patient was worked up under the assumption that this was a TIA she underwent a transthoracic echocardiogram which showed an ejection fraction of 65-70%, normal left ventricle size, slightly calcified mitral valve, very mild stenosis almost trivial for the aortic valve, a  moderately dilated left atrium there was no mentioning of pulmonary hypertension being found or not.  Risk factors were diabetes, hypertension, BMI of 44.7,  Susan House was placed back on Plavix,Plavix was discontinued after she had a GI bleed on 06-21-2016, colonoscopy confirmed ulceration 06-2016 .  She will continue amlodipine, Lipitor, Lomotil, insulin NovoLog, Lipitor Apresoline, hydrocodone for pain as needed prescribed by Dr. Rosanna Randy, Protonix, Ditropan, metoprolol, nitroglycerin as needed, metformin 1000 mg twice a day, and Keppra 500 mg tablets 2 tablets twice a day. I have the pleasure of seeing Susan House and her husband today on 04 July 2017, the patient had been evaluated in May for a possible seizure activity versus TIA.  Her EEG returned normal.  She had no more spells since.  Patient is no longer on baclofen, stroke prevention is provided by antiplatelet therapy  with aspirin,She is pain-free, she reports no headaches, dizziness or palpitations.  She is walking with a  single point cane.  Today is 11/27/2016 and I have the pleasure of seeing Susan House in the presence of her husband, daughter and granddaughter. Susan House suffered a possible seizure episode on 10/15/2016. [name] daughter described which she had witnessed a day; the patient had fallen out of bed and onto a stool that was placed next to the bed she had very limitedrecollection of what happened and her husband called to the emergency room services. Her daughter met her at the hospital and found her mother still very confused she had speech arrest. She tried to communicate by gesticulation. Was pantng, seemed in discomfort. VS were Ok. Patient remained restless and erratic, needing to nurses to observe her and basically help prevent injuries. Then within an hour or so the patient became suddenly fully aware of her surroundings. The patient had received nitroglycerin for chest pain, she had some aspirin after her stroke had  been ruled out, nausea medication, but she was not given a seizure medicine or sedation.  The EEG during hospitalization was read by Dr. Alexis Goodell and described a drowsy rhythm with rare left temporal sharp transients and phase reversal at the third temporal lead. Photic stimulation did not show any changes, hyperventilation was deferred. The finding was suggestive of a focal disturbance with epileptogenic potential.The patient was treated by Dr. Tressia Miners, and the discharge diagnosis on April 3 with acute encephalopathy with a new onset seizure, abnormal EEG, started on Keppra 500 mg twice a day to follow-up with neurology outpatient, no driving. She has taking it and feels slowed, thought process delay. Her family feels she is snappy and angry since starting the medication.  She takes narco for back pain. Other medications were baclofen, Lipitor, Flexeril, Lomotil, enalapril, gabapentin, magnesium oxide insulin did troponin, Protonix, nitroglycerin, Toprol. Plavix was discontinued after she had a GI bleed, colonoscopy confirmed ulceration 06-2016 . May re start after 6 month.  MM- 2017 [name] is a 63 year old female with a history of stroke with subsequent left hemiparesis. Her stroke occurred in 2000. She reports that she has continued on aspirin and Plavix. She is tolerating these medications well. Her blood pressure is slightly elevated today. Her primary care is managing her blood pressure medications. Reports that her last hemoglobin A1c was 8.4. She is currently on Lipitor for her cholesterol. She denies any additional strokelike symptoms. She states that she does have difficulty walking long distances and for that reason she will use a wheelchair. She has very limited movement of the left arm. She has found baclofen to be beneficial for spasms. She also has a history of restless leg symptoms. Her primary care tried her on Requip however she did not find it beneficial. She is currently on  gabapentin not specifically for restless leg symptoms. She states that her symptoms are tolerable but does at times interfere with her sleep. She returns today for an evaluation.  HISTORY (Dr. Edwena Felty note): Susan House is a 63 year old Caucasian female patient with significant left hemiparetic residual and left homonymous hemianopsia after her stroke she suffered in 2000. She has continued to take Plavix and aspirin and her risk factors have not changed significantly since our last visit. She has a history of his hypertension, migraine headaches, gastroesophageal reflux, diabetes, hyperlipidemia, obesity, irritable bowel syndrome, urinary incontinence, and was last admitted with a UTI to [facility] in [place]. Admission  date was 12/26/2015 under the guidance of Dr. Fulton Reek. Primary doctor is Miguel Aschoff Junior discharge date was 12/28/2015. The patient was diagnosed with altered mental status due to urosepsis. She was treated with 3 antibiotics. Upon discharge the patient was told that she did not have an infection? She was diagnosed with systemic inflammatory response syndrome and encephalopathy. A CT of the head was obtained on the 11th of June, it showed no acute intracranial abnormalities, an MRI of the brain with and without contrast followed on 12/28/2015."   It appeared stable in comparison to a brain image from 2015 with chronic deep left intracranial gliosis, postoperative changes to both globes after cataract surgery, some minor nasal sinus mucosal thickening. Chronic encephalomalacia in the left MCA territory with ex vacuo enlargement of the left frontal horn of the ventricle" she had that stroke in 2000.  The patient reports that on Saturday preceding her admission to the hospital she had worked on her parents state ready to dissolve the estate. She was at least 8 hours out on a very hot and sunny day working outside not sure that she hydrated properly  developing a terrific headache she cannot remember what happened that night from Saturday to Sunday and her husband reports that she was not able to sleep for a couple of nights. He accompanied her to the hospital. It is likely that she had a heat related accident. She missed a couple of medicine doses as well.    REVIEW OF SYSTEMS: Out of a complete 14 system review of symptoms, the patient complains only of the following symptoms, and all other reviewed systems are negative.   She was back to baseline after presumed TIA in 2018 but after 2019 remained with slurred speecch. . No seizure .  ALLERGIES: Allergies  Allergen Reactions  . Morphine And Related Anaphylaxis  . Fentanyl Other (See Comments)    Unknown reaction.  . Reglan [Metoclopramide]     Other reaction(s): Unknown Elevated BP  . Simvastatin Other (See Comments)    Muscle pain  . Betadine [Povidone Iodine] Rash  . Tetracyclines & Related Rash    Other reaction(s): Unknown    HOME MEDICATIONS: Outpatient Medications Prior to Visit  Medication Sig Dispense Refill  . ACCU-CHEK SOFTCLIX LANCETS lancets CHECK BLOOD SUGAR THREE TIMES DAILY 300 each 3  . amLODipine (NORVASC) 10 MG tablet TAKE 1 TABLET BY MOUTH  DAILY 90 tablet 3  . aspirin 81 MG chewable tablet Chew 1 tablet (81 mg total) by mouth daily.    Marland Kitchen atorvastatin (LIPITOR) 20 MG tablet TAKE 1 TABLET BY MOUTH  DAILY 90 tablet 3  . Blood Glucose Monitoring Suppl (ACCU-CHEK AVIVA PLUS) w/Device KIT Check sugar three times daily DX E11.9-needs a meter 1 kit 0  . cloNIDine (CATAPRES) 0.3 MG tablet Take 1 tablet (0.3 mg total) by mouth 2 (two) times daily. 180 tablet 3  . clopidogrel (PLAVIX) 75 MG tablet Take 1 tablet (75 mg total) by mouth daily. 90 tablet 3  . Continuous Blood Gluc Receiver (FREESTYLE LIBRE 14 DAY READER) DEVI LENGTH OF NEED: LIFETIME - UNLESS SPECIFIED OTHERWISE 2 Device 11  . Continuous Blood Gluc Sensor (FREESTYLE LIBRE 14 DAY SENSOR) MISC LENGTH OF  NEED: LIFETIME - UNLESS SPECIFIED OTHERWISE 2 each 0  . enalapril (VASOTEC) 20 MG tablet Take 1 tablet (20 mg total) by mouth 2 (two) times daily. 180 tablet 3  . gabapentin (NEURONTIN) 100 MG capsule Take 200 mg by mouth 3 (three) times  daily.    . glucose blood (ACCU-CHEK AVIVA PLUS) test strip TEST BLOOD SUGAR THREE TIMES DAILY 300 each 3  . hydrALAZINE (APRESOLINE) 100 MG tablet TAKE ONE TABLET BY MOUTH TWICE DAILY 180 tablet 2  . HYDROcodone-acetaminophen (NORCO) 10-325 MG tablet Take 1 tablet by mouth every 6 (six) hours as needed. 100 tablet 0  . insulin degludec (TRESIBA FLEXTOUCH) 100 UNIT/ML SOPN FlexTouch Pen Up to 30 units daily. 3 pen 11  . Lancet Devices (ACCU-CHEK SOFTCLIX) lancets Check sugar three times daily DX E11.9 100 each 3  . levETIRAcetam (KEPPRA) 500 MG tablet Take 1 tablet (500 mg total) by mouth 2 (two) times daily. 500 mg bid. 180 tablet 3  . metFORMIN (GLUCOPHAGE) 1000 MG tablet Take 1 tablet (1,000 mg total) by mouth 2 (two) times daily. 180 tablet 1  . metoprolol succinate (TOPROL-XL) 100 MG 24 hr tablet Take 1 tablet (100 mg total) by mouth daily. 90 tablet 3  . nitroGLYCERIN (NITROSTAT) 0.4 MG SL tablet Place 1 tablet (0.4 mg total) under the tongue every 5 (five) minutes as needed for chest pain. 25 tablet 0  . ondansetron (ZOFRAN) 4 MG tablet Take 1 tablet (4 mg total) by mouth every 8 (eight) hours as needed for nausea or vomiting. 60 tablet 5  . oxybutynin (DITROPAN) 5 MG tablet Take 1 tablet (5 mg total) by mouth 2 (two) times daily. 180 tablet 3  . pantoprazole (PROTONIX) 40 MG tablet TAKE 1 TABLET BY MOUTH  DAILY 90 tablet 3  . traMADol-acetaminophen (ULTRACET) 37.5-325 MG tablet Take 1 tablet by mouth every 6 (six) hours as needed. 30 tablet 0  . cephALEXin (KEFLEX) 500 MG capsule Take 1 capsule (500 mg total) by mouth 2 (two) times daily. 14 capsule 0  . conjugated estrogens (PREMARIN) vaginal cream Apply small amount topically to urethra area at bedtime  daily for 1 week the 3 times a week for 3 weeks. 42.5 g 1  . mupirocin cream (BACTROBAN) 2 % Apply 1 application topically daily.    Marland Kitchen nystatin cream (MYCOSTATIN) Apply 1 application topically 2 (two) times daily. (Patient not taking: Reported on 05/29/2019) 30 g 0   No facility-administered medications prior to visit.    PAST MEDICAL HISTORY: Past Medical History:  Diagnosis Date  . Allergy   . Arthritis   . Atypical chest pain    a. Reportedly nl cath in the past; b. 12/2009 MV: No ischemia/infarct. EF 87%.  . Carotid arterial disease ([place])    a. 09/5454 CTA head/neck: LICA 25W, RICA 38-->LHT rx.  . Cellulitis and abscess of face   . Chronic Dizziness   . Coronary artery calcification seen on CT scan    a. 10/2016 CTA chest: Atherosclerotic calcifications Ao, cors, and prox great vessels.  . Depression   . Diabetes mellitus ([place])   . Diastolic dysfunction    a. 2008 Echo: Nl EF; b. 2011 Echo: Nl EF; c. 06/2016 Echo: EF 55-60%, Gr1DD; d. 03/2018 Echo: EF 55-60%, no rwma, Gr1DD, mod dil LA.  . Edema   . GERD (gastroesophageal reflux disease)   . Headache   . Hematuria   . Hyperlipidemia   . Hypertension   . IBS (irritable bowel syndrome)   . Migraine   . Morbid obesity ([place])   . Nocturia   . Possible Seizure ([place])    a. 10/2016 ? sz. MRI neg for stroke.  . Stroke ([place]) 2000  . TIA (transient ischemic attack) 2010 & 2019  a. 2010; b. 03/2018 Left sided wkns-->MRI neg for stroke. CTA head/neck: Right A2 cut off/occlusion w/ collats extending over R cerebral convexity. LICA 99J, RICA 45, RSubclavian 50->med rx.  . Urgency of micturation   . Urinary frequency   . Urinary incontinence     PAST SURGICAL HISTORY: Past Surgical History:  Procedure Laterality Date  . ABDOMINAL HYSTERECTOMY    . ABDOMINOPLASTY    . APPENDECTOMY    . BACK SURGERY     extensive spinal fusion  . carpel tunnel surgery    . CATARACT EXTRACTION     x 2  . CESAREAN SECTION    . CHOLECYSTECTOMY    .  COLONOSCOPY  2010  . COLONOSCOPY WITH PROPOFOL N/A 07/11/2016   Procedure: COLONOSCOPY WITH PROPOFOL;  Surgeon: Lucilla Lame, MD;  Location: [facility] ENDOSCOPY;  Service: Endoscopy;  Laterality: N/A;  . COLONOSCOPY WITH PROPOFOL N/A 03/26/2018   Procedure: COLONOSCOPY WITH PROPOFOL;  Surgeon: Lucilla Lame, MD;  Location: [facility] ENDOSCOPY;  Service: Endoscopy;  Laterality: N/A;  . ESOPHAGOGASTRODUODENOSCOPY (EGD) WITH PROPOFOL N/A 03/26/2018   Procedure: ESOPHAGOGASTRODUODENOSCOPY (EGD) WITH PROPOFOL;  Surgeon: Lucilla Lame, MD;  Location: [facility] ENDOSCOPY;  Service: Endoscopy;  Laterality: N/A;  . EYE SURGERY    . HERNIA REPAIR    . SHOULDER SURGERY    . TOE SURGERY    . UPPER GI ENDOSCOPY    . WRIST FRACTURE SURGERY Left     FAMILY HISTORY: Family History  Problem Relation Age of Onset  . Hyperlipidemia Mother   . Arrhythmia Mother        WPW  . Hypertension Mother   . Rheum arthritis Mother   . Heart disease Mother   . Heart attack Father 81  . Hyperlipidemia Father   . Hypertension Father   . Stroke Father   . Diabetes Father   . Heart disease Father   . Coronary artery disease Father   . Diabetes Sister   . Hyperlipidemia Sister   . Hypertension Sister   . Depression Sister   . Depression Sister   . Diabetes Sister   . Hypertension Sister   . Hyperlipidemia Sister   . Kidney disease Paternal Grandmother     SOCIAL HISTORY: Social History   Socioeconomic History  . Marital status: Married    Spouse name: Susan House   . Number of children: 2  . Years of education: 7  . Highest education level: Not on file  Occupational History  . Occupation: Disabled   . Occupation: retired  Tobacco Use  . Smoking status: Never Smoker  . Smokeless tobacco: Never Used  Substance and Sexual Activity  . Alcohol use: No  . Drug use: No  . Sexual activity: Never  Other Topics Concern  . Not on file  Social History Narrative   Patient lives in [place] at home with husband Susan House.     Patient has 2 children and 2 step children.    Patient has 12+ years of education.    Patient is Disabled since stroke in 2000.   Social Determinants of Health   Financial Resource Strain:   . Difficulty of Paying Living Expenses: Not on file  Food Insecurity:   . Worried About Charity fundraiser in the Last Year: Not on file  . Ran Out of Food in the Last Year: Not on file  Transportation Needs:   . Lack of Transportation (Medical): Not on file  . Lack of Transportation (Non-Medical): Not on file  Physical  Activity:   . Days of Exercise per Week: Not on file  . Minutes of Exercise per Session: Not on file  Stress:   . Feeling of Stress : Not on file  Social Connections:   . Frequency of Communication with Friends and Family: Not on file  . Frequency of Social Gatherings with Friends and Family: Not on file  . Attends Religious Services: Not on file  . Active Member of Clubs or Organizations: Not on file  . Attends Archivist Meetings: Not on file  . Marital Status: Not on file  Intimate Partner Violence:   . Fear of Current or Ex-Partner: Not on file  . Emotionally Abused: Not on file  . Physically Abused: Not on file  . Sexually Abused: Not on file      PHYSICAL EXAM  Vitals:   07/03/19 1328  BP: (!) 141/76  Pulse: 66  Temp: (!) 97.2 F (36.2 C)  Weight: 232 lb (105.2 kg)  Height: '5\' 1"'$  (1.549 m)   Body mass index is 43.84 kg/m.  Generalized: Well developed, in no acute distress, well groomed. Cooperative    Neurological examination  Mentation: Alert oriented to time, place, history taking. She speaks haltering,  Cranial nerve :  No change in taste or smell,  Pupils were equal, status post cataract, she has diabetic retinopathy.    Extraocular movements were full, visual field were full on confrontational test. Facial sensation normal.facial asymmetry with drooling out of left angle of her mouth.  Uvula and tongue deviation . Head turning and shoulder  shrug were intact .  Right shoulder is lower.  Motor: Limited movement in the left arm particularly in the elbow and hands. Left arm muscle bulk atrophy.  Spasticity over left extremities - DTR brisk.  Sensory: Sensory testing is intact to soft touch on all 4 extremities however slightly decreased on the left hand. Coordination: Cerebellar testing reveals good finger-nose-finger on the right, with mild dysmetria and pronator drift  difficulty on the left. Reflexes: 2/2  Cannot rise from seated position without bracing, 3 attempts.  Coordination and gait - everted left foot, cannot bear weight, leaning onto cane.  Turning  with almost 8 steps. Stopped posture.    DIAGNOSTIC DATA (LABS, IMAGING, TESTING) - I reviewed patient records, labs, notes, testing and imaging myself where available. I reviewed the patient's 9-17 -2019 brain MRI and MRA, Echocardiogram, carotid doppler.   Lab Results  Component Value Date   WBC 6.4 01/23/2019   HGB 11.2 01/23/2019   HCT 35.1 01/23/2019   MCV 87 01/23/2019   PLT 223 01/23/2019      Component Value Date/Time   NA 140 01/23/2019 1442   NA 140 06/17/2014 0744   K 4.0 01/23/2019 1442   K 4.1 06/17/2014 0744   CL 103 01/23/2019 1442   CL 105 06/17/2014 0744   CO2 21 01/23/2019 1442   CO2 27 06/17/2014 0744   GLUCOSE 351 (H) 01/23/2019 1442   GLUCOSE 151 (H) 11/29/2018 1636   GLUCOSE 116 (H) 06/17/2014 0744   BUN 23 01/23/2019 1442   BUN 35 (H) 06/17/2014 0744   CREATININE 1.12 (H) 01/23/2019 1442   CREATININE 1.29 06/17/2014 0744   CALCIUM 8.3 (L) 01/23/2019 1442   CALCIUM 8.6 06/17/2014 0744   PROT 5.9 (L) 01/23/2019 1442   PROT 6.5 01/18/2014 0059   ALBUMIN 3.9 01/23/2019 1442   ALBUMIN 3.0 (L) 01/18/2014 0059   AST 19 01/23/2019 1442   AST 21  01/18/2014 0059   ALT 16 01/23/2019 1442   ALT 18 01/18/2014 0059   ALKPHOS 79 01/23/2019 1442   ALKPHOS 97 01/18/2014 0059   BILITOT 0.2 01/23/2019 1442   BILITOT 0.3 01/18/2014 0059    GFRNONAA 53 (L) 01/23/2019 1442   GFRNONAA 45 (L) 06/17/2014 0744   GFRNONAA 47 (L) 01/18/2014 0059   GFRAA 61 01/23/2019 1442   GFRAA 55 (L) 06/17/2014 0744   GFRAA 55 (L) 01/18/2014 0059   Lab Results  Component Value Date   CHOL 102 04/01/2018   HDL 29 (L) 04/01/2018   LDLCALC 33 04/01/2018   TRIG 198 (H) 04/01/2018   CHOLHDL 3.5 04/01/2018   Lab Results  Component Value Date   HGBA1C 9.4 (A) 05/28/2019   Lab Results  Component Value Date   VITAMINB12 551 01/15/2009   Lab Results  Component Value Date   TSH 1.320 12/25/2017    Poorly controlled DM -   ASSESSMENT AND PLAN 63 y.o. year old female  has a past medical history of Allergy, Arthritis, Atypical chest pain, Carotid arterial disease ([place]), Cellulitis and abscess of face, Chronic Dizziness, Coronary artery calcification seen on CT scan, Depression, Diabetes mellitus ([place]), Diastolic dysfunction, Edema, GERD (gastroesophageal reflux disease), Headache, Hematuria, Hyperlipidemia, Hypertension, IBS (irritable bowel syndrome), Migraine, Morbid obesity ([place]), Nocturia, Possible Seizure ([place]), Stroke ([place]) (2000), TIA (transient ischemic attack) (2010 & 2019), Urgency of micturation, Urinary frequency, and Urinary incontinence. here with:  1) stabilized Left hemiparesis post CVA- this  is in my opinion unchanged with left grip weakness, gait disorder, but very slurred,  slowed and haltering speech.   She needs to move - needs to walk- preferably indoors at a gym, [employer], or in a mall. I  Like her to resume water exercises once allowed.  2) she described  Vertigo - the room spins when she rises from a seated position- orthostatic dizziness.   3) uncontrolled DM at HBA 1c 11(!)     The patient has brain atrophy, ex vacuo atrophy and asymmetry of ventricles. There is one embolic stroke and many,many lacunes in the basal gangliae. Small vessel disease and large vessel disease. Risk factors are the known underlying conditions of  HTN and DM.   Rv in December 2021 with Stroke Service ,  NP, Venancio Poisson, alternating with me.  Patient has last seen NP VanSchaick during the pandemic in a phone visit.   Larey Seat, MD   07/03/2019, 1:43 PM [facility] [address], [place], [place] [address] [phone]

## 2019-07-19 ENCOUNTER — Emergency Department
Admission: EM | Admit: 2019-07-19 | Discharge: 2019-07-20 | Disposition: A | Discharge status: Home / Self Care | Payer: Medicare Other | Attending: Emergency Medicine | Admitting: Emergency Medicine

## 2019-07-19 ENCOUNTER — Other Ambulatory Visit: Payer: Self-pay

## 2019-07-19 ENCOUNTER — Encounter: Payer: Self-pay | Admitting: Emergency Medicine

## 2019-07-19 DIAGNOSIS — Z5321 Procedure and treatment not carried out due to patient leaving prior to being seen by health care provider: Secondary | ICD-10-CM | POA: Diagnosis not present

## 2019-07-19 DIAGNOSIS — R319 Hematuria, unspecified: Secondary | ICD-10-CM | POA: Diagnosis not present

## 2019-07-19 LAB — CBC
HCT: 32.3 % — ABNORMAL LOW (ref 36.0–46.0)
Hemoglobin: 10.9 g/dL — ABNORMAL LOW (ref 12.0–15.0)
MCH: 27.9 pg (ref 26.0–34.0)
MCHC: 33.7 g/dL (ref 30.0–36.0)
MCV: 82.6 fL (ref 80.0–100.0)
Platelets: 243 10*3/uL (ref 150–400)
RBC: 3.91 MIL/uL (ref 3.87–5.11)
RDW: 13.6 % (ref 11.5–15.5)
WBC: 7.3 10*3/uL (ref 4.0–10.5)
nRBC: 0 % (ref 0.0–0.2)

## 2019-07-19 LAB — BASIC METABOLIC PANEL
Anion gap: 11 (ref 5–15)
BUN: 25 mg/dL — ABNORMAL HIGH (ref 8–23)
CO2: 22 mmol/L (ref 22–32)
Calcium: 8.5 mg/dL — ABNORMAL LOW (ref 8.9–10.3)
Chloride: 107 mmol/L (ref 98–111)
Creatinine, Ser: 1.34 mg/dL — ABNORMAL HIGH (ref 0.44–1.00)
GFR calc Af Amer: 49 mL/min — ABNORMAL LOW (ref >60)
GFR calc non Af Amer: 42 mL/min — ABNORMAL LOW (ref >60)
Glucose, Bld: 213 mg/dL — ABNORMAL HIGH (ref 70–99)
Potassium: 4 mmol/L (ref 3.5–5.1)
Sodium: 140 mmol/L (ref 135–145)

## 2019-07-19 NOTE — ED Notes (Signed)
First Nurse Note: Pt to ED. States that she is having bleeding. Thinks that it is coming from her urinary tract. Pt is in NAD.

## 2019-07-19 NOTE — ED Notes (Signed)
No answer when pt called in lobby.

## 2019-07-19 NOTE — ED Triage Notes (Signed)
Pt to ED via POV c/o hematuria since this morning. Pt states that she is currently on plavix. Pt states that she is having to wear a pad because she is constantly bleeding. Pt is in NAD.   Pt states that she thought she had a UTI and she found some abx that she had at home and she started taking them.

## 2019-07-22 ENCOUNTER — Telehealth: Payer: Self-pay

## 2019-07-22 ENCOUNTER — Ambulatory Visit: Payer: Self-pay

## 2019-07-22 NOTE — Telephone Encounter (Signed)
Called the patient and she states that she has been feeling off today. She is having some dizziness and she states hard to tell if something is going on with her left side because she is paralyzed. She feels tingling in the left hand and numbness feeling. Advised the patient that I agree with the person she spoke with earlier. That she needs to get this checked out by going to the ED. Would want to make sure there is no bleeding on the brain since she takes plavix but also we would want to make sure there is no new strokes. Pt verbalized understanding. Informed her to contact us after she had work up completed and we can look at getting her scheduled with Dr Brett Fairy or the NP

## 2019-07-22 NOTE — Telephone Encounter (Signed)
Incoming call from Patient with a complaint that she has not been feeling since early this morning.  Patient feels like she may be having a stroke.  Reports Left sided weakness with SOB and chest pain. Hand writing.  " out of wack ".  Talking slow.  Report blurred vision. Patient states she is home alone.  Attempting to call husband. Call back to Patient.  Husband  Is now home.   Preparing to go to ED.               Reason for Disposition . [1] Weakness (i.e., paralysis, loss of muscle strength) of the face, arm / hand, or leg / foot on one side of the body AND [2] sudden onset AND [3] present now  Answer Assessment - Initial Assessment Questions 1. SYMPTOM: "What is the main symptom you are concerned about?" (e.g., weakness, numbness)    Left sided weakness, SOB with chest pain, had writing out ack.   2. ONSET: "When did this start?" (minutes, hours, days; while sleeping)     With in last 20 min.  3. LAST NORMAL: "When was the last time you were normal (no symptoms)?"     This morning 4. PATTERN "Does this come and go, or has it been constant since it started?"  "Is it present now?"     constant 5. CARDIAC SYMPTOMS: "Have you had any of the following symptoms: chest pain, difficulty breathing, palpitations?"    Chest pain intermittent  6. NEUROLOGIC SYMPTOMS: "Have you had any of the following symptoms: headache, dizziness, vision loss, double vision, changes in speech, unsteady on your feet?"    Talks slow vision  Heads not right  7. OTHER SYMPTOMS: "Do you have any other symptoms?"     Dizziness blurred eyesight.   8. PREGNANCY: "Is there any chance you are pregnant?" "When was your last menstrual period?"    na  Protocols used: NEUROLOGIC DEFICIT-A-AH

## 2019-07-22 NOTE — Telephone Encounter (Signed)
Antoinette called to schedule patient a sooner appointment stating they have advised patient to go to the ED however patient Left without being seen and is requesting to be seen with her neurologist. No soon apt available at the time of call.   Antoinette states the Patient reported symptoms as: weak, unable to walk,dizzy,off vision, hand writing Is off and her head is "crazy"  Asked  to FU with patient directly  Please follow up

## 2019-07-23 ENCOUNTER — Telehealth: Payer: Self-pay | Admitting: Emergency Medicine

## 2019-07-23 NOTE — Telephone Encounter (Signed)
Called patient due to lwot to inquire about condition and follow up plans. Says she thinks her doctor already looked at her labs.  Says she thinks the hematurea was from uti which she was treated for and that is better.

## 2019-07-28 ENCOUNTER — Other Ambulatory Visit: Payer: Self-pay | Admitting: Family Medicine

## 2019-07-28 DIAGNOSIS — I10 Essential (primary) hypertension: Secondary | ICD-10-CM

## 2019-07-28 MED ORDER — ENALAPRIL MALEATE 20 MG PO TABS: 20.0000 mg | ORAL_TABLET | Freq: Two times a day (BID) | ORAL | 3 refills | Status: DC

## 2019-07-28 MED ORDER — HYDRALAZINE HCL 100 MG PO TABS: 100.0000 mg | ORAL_TABLET | Freq: Two times a day (BID) | ORAL | 2 refills | Status: DC

## 2019-07-28 NOTE — Telephone Encounter (Signed)
Medication Refill - Medication: enalapril (VASOTEC) 20 MG tablet [[id number]]   hydrALAZINE (APRESOLINE) 100 MG tablet [[id number]]     Preferred Pharmacy (with phone number or street name):  [facility], [address] [place] [address] [place] [address]  Phone: [phone] Fax: [phone]     Agent: Please be advised that RX refills may take up to 3 business days. We ask that you follow-up with your pharmacy.

## 2019-07-30 ENCOUNTER — Encounter: Payer: Self-pay | Admitting: Adult Health

## 2019-07-30 ENCOUNTER — Other Ambulatory Visit: Payer: Self-pay

## 2019-07-30 ENCOUNTER — Ambulatory Visit (INDEPENDENT_AMBULATORY_CARE_PROVIDER_SITE_OTHER): Payer: Medicare Other | Admitting: Adult Health

## 2019-07-30 VITALS — BP 142/80 | HR 76 | Temp 96.2°F | Resp 16

## 2019-07-30 DIAGNOSIS — R710 Precipitous drop in hematocrit: Secondary | ICD-10-CM

## 2019-07-30 DIAGNOSIS — R300 Dysuria: Secondary | ICD-10-CM

## 2019-07-30 DIAGNOSIS — N390 Urinary tract infection, site not specified: Secondary | ICD-10-CM | POA: Diagnosis not present

## 2019-07-30 DIAGNOSIS — D649 Anemia, unspecified: Secondary | ICD-10-CM

## 2019-07-30 DIAGNOSIS — N362 Urethral caruncle: Secondary | ICD-10-CM

## 2019-07-30 DIAGNOSIS — N289 Disorder of kidney and ureter, unspecified: Secondary | ICD-10-CM

## 2019-07-30 DIAGNOSIS — R319 Hematuria, unspecified: Secondary | ICD-10-CM | POA: Diagnosis not present

## 2019-07-30 LAB — POCT URINALYSIS DIPSTICK
Bilirubin, UA: NEGATIVE
Glucose, UA: POSITIVE — AB
Nitrite, UA: NEGATIVE
Protein, UA: POSITIVE — AB
Spec Grav, UA: 1.02 (ref 1.010–1.025)
Urobilinogen, UA: 1 E.U./dL
pH, UA: 5 (ref 5.0–8.0)

## 2019-07-30 MED ORDER — AMOXICILLIN-POT CLAVULANATE 875-125 MG PO TABS: 1.0000 | ORAL_TABLET | Freq: Two times a day (BID) | ORAL | 0 refills | Status: DC

## 2019-07-30 NOTE — Progress Notes (Signed)
Patient: Susan House Female    DOB: May 11, 1956   64 y.o.   MRN: [id number] Visit Date: 07/30/2019  Today's Provider: Marcille Buffy, FNP   Chief Complaint  Patient presents with  . Urinary Tract Infection   Subjective:     Dysuria  This is a new problem. The current episode started yesterday. The problem has been gradually improving. The patient is experiencing no pain. There has been no fever. She is not sexually active. Pertinent negatives include no chills, discharge, flank pain, frequency, hematuria, hesitancy, nausea, possible pregnancy, sweats, urgency or vomiting.    She reports dysuria since yesterday and she reports mildly better today. She has not taken any medication for it. Denies any abdominal pain. Denies hesitancy or frequency. Denies any Vaginal  bleeding or discharge. today, denies any hematuria since 07/19/2019.  She took Cipro on 07/19/2019 that she had at home. ER documented hematuria reported by patinet she denies any then or now.   Reviewed history of neurological issues and she and husband in room report all symptoms have resolved. She feels symptoms were related to UTI which she took Cipro she had at home for this at that time.   Patient went to ED on 07/19/2019 for bleeding from her urinary tract - noted that when she was called in lobby no response.  noted not to have been evaluated. See below reviewed. CBC. CMET was performed appears patient left after that.   Hemoglobin 10.9/ HCT 32.3 on 07/19/2019 / 6 months ago was 11.8/ hct 35.0 Creatinine 1.34 GFR 42   Denies any rectal bleeding.   Registered Nurse  Outpatient  ED Triage Notes  Signed  Date of Service:  07/19/2019 2:12 PM          Signed         [x] Clelia Schaumann, RN  [] Hover for details Pt to ED via POV c/o hematuria since this morning. Pt states that she is currently on plavix. Pt states that she is having to wear a pad because she is constantly bleeding. Pt is in NAD.    Pt states that she thought she had a UTI and she found some abx that she had at home and she started taking them.          Patient  denies any fever, body aches,chills, rash, chest pain, shortness of breath, nausea, vomiting, or diarrhea.    Allergies  Allergen Reactions  . Morphine And Related Anaphylaxis  . Fentanyl Other (See Comments)    Unknown reaction.  . Reglan [Metoclopramide]     Other reaction(s): Unknown Elevated BP  . Simvastatin Other (See Comments)    Muscle pain  . Betadine [Povidone Iodine] Rash  . Tetracyclines & Related Rash    Other reaction(s): Unknown     Current Outpatient Medications:  .  ACCU-CHEK SOFTCLIX LANCETS lancets, CHECK BLOOD SUGAR THREE TIMES DAILY, Disp: 300 each, Rfl: 3 .  amLODipine (NORVASC) 10 MG tablet, TAKE 1 TABLET BY MOUTH  DAILY, Disp: 90 tablet, Rfl: 3 .  aspirin 81 MG chewable tablet, Chew 1 tablet (81 mg total) by mouth daily., Disp: , Rfl:  .  atorvastatin (LIPITOR) 20 MG tablet, TAKE 1 TABLET BY MOUTH  DAILY, Disp: 90 tablet, Rfl: 3 .  Blood Glucose Monitoring Suppl (ACCU-CHEK AVIVA PLUS) w/Device KIT, Check sugar three times daily DX E11.9-needs a meter, Disp: 1 kit, Rfl: 0 .  cloNIDine (CATAPRES) 0.3 MG tablet, Take 1 tablet (  0.3 mg total) by mouth 2 (two) times daily., Disp: 180 tablet, Rfl: 3 .  clopidogrel (PLAVIX) 75 MG tablet, Take 1 tablet (75 mg total) by mouth daily., Disp: 90 tablet, Rfl: 3 .  Continuous Blood Gluc Receiver (FREESTYLE LIBRE 14 DAY READER) DEVI, LENGTH OF NEED: LIFETIME - UNLESS SPECIFIED OTHERWISE, Disp: 2 Device, Rfl: 11 .  Continuous Blood Gluc Sensor (FREESTYLE LIBRE 14 DAY SENSOR) MISC, LENGTH OF NEED: LIFETIME - UNLESS SPECIFIED OTHERWISE, Disp: 2 each, Rfl: 0 .  enalapril (VASOTEC) 20 MG tablet, Take 1 tablet (20 mg total) by mouth 2 (two) times daily., Disp: 180 tablet, Rfl: 3 .  gabapentin (NEURONTIN) 100 MG capsule, Take 200 mg by mouth 3 (three) times daily., Disp: , Rfl:  .  glucose blood  (ACCU-CHEK AVIVA PLUS) test strip, TEST BLOOD SUGAR THREE TIMES DAILY, Disp: 300 each, Rfl: 3 .  hydrALAZINE (APRESOLINE) 100 MG tablet, Take 1 tablet (100 mg total) by mouth 2 (two) times daily., Disp: 180 tablet, Rfl: 2 .  HYDROcodone-acetaminophen (NORCO) 10-325 MG tablet, Take 1 tablet by mouth every 6 (six) hours as needed., Disp: 100 tablet, Rfl: 0 .  insulin degludec (TRESIBA FLEXTOUCH) 100 UNIT/ML SOPN FlexTouch Pen, Up to 30 units daily., Disp: 3 pen, Rfl: 11 .  Lancet Devices (ACCU-CHEK SOFTCLIX) lancets, Check sugar three times daily DX E11.9, Disp: 100 each, Rfl: 3 .  levETIRAcetam (KEPPRA) 500 MG tablet, Take 1 tablet (500 mg total) by mouth 2 (two) times daily. 500 mg bid., Disp: 180 tablet, Rfl: 3 .  metFORMIN (GLUCOPHAGE) 1000 MG tablet, Take 1 tablet (1,000 mg total) by mouth 2 (two) times daily., Disp: 180 tablet, Rfl: 1 .  metoprolol succinate (TOPROL-XL) 100 MG 24 hr tablet, Take 1 tablet (100 mg total) by mouth daily., Disp: 90 tablet, Rfl: 3 .  nitroGLYCERIN (NITROSTAT) 0.4 MG SL tablet, Place 1 tablet (0.4 mg total) under the tongue every 5 (five) minutes as needed for chest pain., Disp: 25 tablet, Rfl: 0 .  ondansetron (ZOFRAN) 4 MG tablet, Take 1 tablet (4 mg total) by mouth every 8 (eight) hours as needed for nausea or vomiting., Disp: 60 tablet, Rfl: 5 .  oxybutynin (DITROPAN) 5 MG tablet, Take 1 tablet (5 mg total) by mouth 2 (two) times daily., Disp: 180 tablet, Rfl: 3 .  pantoprazole (PROTONIX) 40 MG tablet, TAKE 1 TABLET BY MOUTH  DAILY, Disp: 90 tablet, Rfl: 3 .  traMADol-acetaminophen (ULTRACET) 37.5-325 MG tablet, Take 1 tablet by mouth every 6 (six) hours as needed., Disp: 30 tablet, Rfl: 0 .  amoxicillin-clavulanate (AUGMENTIN) 875-125 MG tablet, Take 1 tablet by mouth 2 (two) times daily., Disp: 14 tablet, Rfl: 0 .  estradiol (ESTRACE) 0.1 MG/GM vaginal cream, APPLY A SMALL AMOUNT TOPICALLY TO URETHRA AREA AT BEDTIME DAILY FOR 1 WEEK THEN THREE TIMES A WEEK FOR 3  WEEKS, Disp: , Rfl:   Review of Systems  Constitutional: Negative for activity change, appetite change, chills, diaphoresis, fatigue, fever and unexpected weight change.  HENT: Negative.   Respiratory: Negative.   Cardiovascular: Negative.   Gastrointestinal: Negative.  Negative for nausea and vomiting.  Genitourinary: Positive for dysuria. Negative for decreased urine volume, difficulty urinating, dyspareunia, enuresis, flank pain, frequency, genital sores, hematuria, hesitancy, menstrual problem, pelvic pain, urgency, vaginal bleeding, vaginal discharge and vaginal pain.       Hematuria on 07/19/2019 resolved now.  Musculoskeletal: Negative.   Neurological: Negative.  Negative for dizziness, tremors, seizures, syncope, facial asymmetry, speech difficulty, weakness, light-headedness,  numbness and headaches.       Patient and husband in room denies any symptoms currently all resolved. See notes with neurology/ phone calls.   Hematological: Negative.   Psychiatric/Behavioral: Negative.     Social History   Tobacco Use  . Smoking status: Never Smoker  . Smokeless tobacco: Never Used  Substance Use Topics  . Alcohol use: No      Objective:   BP (!) 142/80   Pulse 76   Temp (!) 96.2 F (35.7 C) (Oral)   Resp 16   SpO2 98%  Vitals:   07/30/19 1354  BP: (!) 142/80  Pulse: 76  Resp: 16  Temp: (!) 96.2 F (35.7 C)  TempSrc: Oral  SpO2: 98%  There is no height or weight on file to calculate BMI.   Physical Exam Vitals reviewed.  Constitutional:      General: She is not in acute distress.    Appearance: Normal appearance. She is obese. She is not ill-appearing, toxic-appearing or diaphoretic.     Comments: Patient is alert and oriented and responsive to questions Engages in eye contact with provider. Speaks in full sentences without any pauses without any shortness of breath or distress.    HENT:     Head: Normocephalic and atraumatic.  Cardiovascular:     Rate and Rhythm:  Normal rate and regular rhythm.     Pulses: Normal pulses.     Heart sounds: Normal heart sounds. No murmur. No friction rub. No gallop.   Pulmonary:     Effort: Pulmonary effort is normal. No respiratory distress.     Breath sounds: Normal breath sounds. No stridor. No wheezing, rhonchi or rales.  Chest:     Chest wall: No tenderness.  Abdominal:     General: Abdomen is flat. There is no distension.     Palpations: Abdomen is soft. There is no mass.     Tenderness: There is no abdominal tenderness. There is no right CVA tenderness, left CVA tenderness, guarding or rebound.     Hernia: No hernia is present.  Genitourinary:    Comments: Declined/ deferred. Patient in wheelchair.  Musculoskeletal:     Cervical back: Normal range of motion and neck supple.  Lymphadenopathy:     Cervical: No cervical adenopathy.  Skin:    General: Skin is warm and dry.     Capillary Refill: Capillary refill takes less than 2 seconds.  Neurological:     General: No focal deficit present.     Mental Status: She is alert and oriented to person, place, and time. Mental status is at baseline.     Cranial Nerves: No cranial nerve deficit.     Sensory: No sensory deficit.     Motor: No weakness.     Coordination: Coordination normal.     Gait: Gait normal.     Deep Tendon Reflexes: Reflexes normal.  Psychiatric:        Mood and Affect: Mood normal.        Behavior: Behavior normal.        Thought Content: Thought content normal.        Judgment: Judgment normal.      Results for orders placed or performed in visit on 07/30/19  POCT urinalysis dipstick  Result Value Ref Range   Color, UA amber    Clarity, UA cloudy    Glucose, UA Positive (A) Negative   Bilirubin, UA negative    Ketones, UA trace    Spec  Grav, UA 1.020 1.010 - 1.025   Blood, UA non hemolyzed trace    pH, UA 5.0 5.0 - 8.0   Protein, UA Positive (A) Negative   Urobilinogen, UA 1.0 0.2 or 1.0 E.U./dL   Nitrite, UA negative     Leukocytes, UA Large (3+) (A) Negative   Appearance     Odor         Assessment & Plan     Patient and husband are in agreement to follow-up with urology for history of hematuria and diagnosis is as above. Urinalysis today does show hematuria and infection.  Will treat with Augmentin as below discussed possible side effects.  Patient is in agreement with plan.  Discussed follow-up and return to the office if symptoms change persist or worsen.  Red flag emergency symptoms discussed as well. Meds ordered this encounter  Medications  . amoxicillin-clavulanate (AUGMENTIN) 875-125 MG tablet    Sig: Take 1 tablet by mouth 2 (two) times daily.    Dispense:  14 tablet    Refill:  0   Advise repeat labs in 1 month CBC and CMP to recheck kidney function and hemoglobin hematocrit.  Patient will start a multivitamin with iron, she currently is not taking any vitamins. Will recheck at that time, she also will have evaluation by urologist given history of reported bleeding and most recent ER visit where she was not seen. Encouraged increased fluids, unsweetened cranberry juice, and maintaining hydration.  We will send urine for culture. Orders Placed This Encounter  Procedures  . Urine Culture  . CBC with Differential/Platelet [id number]  . Comprehensive Metabolic Panel (CMET)  . Ambulatory referral to Urology    Referral Priority:   Urgent    Referral Type:   Consultation    Referral Reason:   Specialty Services Required    Requested Specialty:   Urology    Number of Visits Requested:   1  . POCT urinalysis dipstick   Advised patient call the office or your primary care doctor for an appointment if no improvement within 72 hours or if any symptoms change or worsen at any time  Advised ER or urgent Care if after hours or on weekend. Call 911 for emergency symptoms at any time.Patinet verbalized understanding of all instructions given/reviewed and treatment plan and has no further questions or  concerns at this time.    Reason for Referral: hematuria/ history of urethral caruncle/ recurrent urinary tract infections  Referral discussed with patient: yes Best contact number of patient for referral team: [phone] Has patient been seen by a specialist for this issue before: yes . If so, who (practice/provider): she reports she was seen years ago- unable to find who this was with in care everywhere or epic. She or husband unsure who she may have seen.  . Does the patient wish to return: no preference  Patient provider preference for referral: provider  Patient location preference for referral: [place]      The entirety of the information documented in the History of Present Illness, Review of Systems and Physical Exam were personally obtained by me. Portions of this information were initially documented by the  Certified Medical Assistant whose name is documented in [place] and reviewed by me for thoroughness and accuracy.  I have personally performed the exam and reviewed the chart and it is accurate to the best of my knowledge.  Haematologist has been used and any errors in dictation or transcription are unintentional.  Kelby Aline.  [place], [facility]

## 2019-07-30 NOTE — Patient Instructions (Addendum)
Amoxicillin; Clavulanic Acid Tablets What is this medicine? AMOXICILLIN; CLAVULANIC ACID (a mox i SIL in; KLAV yoo lan ic AS id) is a penicillin antibiotic. It treats some infections caused by bacteria. It will not work for colds, the flu, or other viruses. This medicine may be used for other purposes; ask your health care provider or pharmacist if you have questions. COMMON BRAND NAME(S): Augmentin What should I tell my health care provider before I take this medicine? They need to know if you have any of these conditions:  bowel disease, like colitis  kidney disease  liver disease  mononucleosis  an unusual or allergic reaction to amoxicillin, penicillin, cephalosporin, other antibiotics, clavulanic acid, other medicines, foods, dyes, or preservatives  pregnant or trying to get pregnant  breast-feeding How should I use this medicine? Take this drug by mouth. Take it as directed on the prescription label at the same time every day. Take it with food at the start of a meal or snack. Take all of this drug unless your health care provider tells you to stop it early. Keep taking it even if you think you are better. Talk to your health care provider about the use of this drug in children. While it may be prescribed for selected conditions, precautions do apply. Overdosage: If you think you have taken too much of this medicine contact a poison control center or emergency room at once. NOTE: This medicine is only for you. Do not share this medicine with others. What if I miss a dose? If you miss a dose, take it as soon as you can. If it is almost time for your next dose, take only that dose. Do not take double or extra doses. What may interact with this medicine?  allopurinol  anticoagulants  birth control pills  methotrexate  probenecid This list may not describe all possible interactions. Give your health care provider a list of all the medicines, herbs, non-prescription drugs, or  dietary supplements you use. Also tell them if you smoke, drink alcohol, or use illegal drugs. Some items may interact with your medicine. What should I watch for while using this medicine? Tell your doctor or healthcare provider if your symptoms do not improve. This medicine may cause serious skin reactions. They can happen weeks to months after starting the medicine. Contact your healthcare provider right away if you notice fevers or flu-like symptoms with a rash. The rash may be red or purple and then turn into blisters or peeling of the skin. Or, you might notice a red rash with swelling of the face, lips or lymph nodes in your neck or under your arms. Do not treat diarrhea with over the counter products. Contact your doctor if you have diarrhea that lasts more than 2 days or if it is severe and watery. If you have diabetes, you may get a false-positive result for sugar in your urine. Check with your doctor or healthcare provider. Birth control pills may not work properly while you are taking this medicine. Talk to your doctor about using an extra method of birth control. What side effects may I notice from receiving this medicine? Side effects that you should report to your doctor or health care professional as soon as possible:  allergic reactions like skin rash, itching or hives, swelling of the face, lips, or tongue  breathing problems  dark urine  fever or chills, sore throat  redness, blistering, peeling, or loosening of the skin, including inside the mouth  seizures  trouble passing urine or change in the amount of urine  unusual bleeding, bruising  unusually weak or tired  white patches or sores in the mouth or throat Side effects that usually do not require medical attention (report to your doctor or health care professional if they continue or are bothersome):  diarrhea  dizziness  headache  nausea, vomiting  stomach upset  vaginal or anal irritation This list  may not describe all possible side effects. Call your doctor for medical advice about side effects. You may report side effects to FDA at 1-800-FDA-1088. Where should I keep my medicine? Keep out of the reach of children and pets. Store at room temperature between 20 and 25 degrees C (68 and 77 degrees F). Throw away any unused drug after the expiration date. NOTE: This sheet is a summary. It may not cover all possible information. If you have questions about this medicine, talk to your doctor, pharmacist, or health care provider.  2020 Elsevier/Gold Standard (2019-02-03 11:55:53) Urinary Tract Infection, Adult A urinary tract infection (UTI) is an infection of any part of the urinary tract. The urinary tract includes:  The kidneys.  The ureters.  The bladder.  The urethra. These organs make, store, and get rid of pee (urine) in the body. What are the causes? This is caused by germs (bacteria) in your genital area. These germs grow and cause swelling (inflammation) of your urinary tract. What increases the risk? You are more likely to develop this condition if:  You have a small, thin tube (catheter) to drain pee.  You cannot control when you pee or poop (incontinence).  You are female, and: ? You use these methods to prevent pregnancy:  A medicine that kills sperm (spermicide).  A device that blocks sperm (diaphragm). ? You have low levels of a female hormone (estrogen). ? You are pregnant.  You have genes that add to your risk.  You are sexually active.  You take antibiotic medicines.  You have trouble peeing because of: ? A prostate that is bigger than normal, if you are female. ? A blockage in the part of your body that drains pee from the bladder (urethra). ? A kidney stone. ? A nerve condition that affects your bladder (neurogenic bladder). ? Not getting enough to drink. ? Not peeing often enough.  You have other conditions, such as: ? Diabetes. ? A weak  disease-fighting system (immune system). ? Sickle cell disease. ? Gout. ? Injury of the spine. What are the signs or symptoms? Symptoms of this condition include:  Needing to pee right away (urgently).  Peeing often.  Peeing small amounts often.  Pain or burning when peeing.  Blood in the pee.  Pee that smells bad or not like normal.  Trouble peeing.  Pee that is cloudy.  Fluid coming from the vagina, if you are female.  Pain in the belly or lower back. Other symptoms include:  Throwing up (vomiting).  No urge to eat.  Feeling mixed up (confused).  Being tired and grouchy (irritable).  A fever.  Watery poop (diarrhea). How is this treated? This condition may be treated with:  Antibiotic medicine.  Other medicines.  Drinking enough water. Follow these instructions at home:  Medicines  Take over-the-counter and prescription medicines only as told by your doctor.  If you were prescribed an antibiotic medicine, take it as told by your doctor. Do not stop taking it even if you start to feel better. General instructions  Make sure you: ?  Pee until your bladder is empty. ? Do not hold pee for a long time. ? Empty your bladder after sex. ? Wipe from front to back after pooping if you are a female. Use each tissue one time when you wipe.  Drink enough fluid to keep your pee pale yellow.  Keep all follow-up visits as told by your doctor. This is important. Contact a doctor if:  You do not get better after 1-2 days.  Your symptoms go away and then come back. Get help right away if:  You have very bad back pain.  You have very bad pain in your lower belly.  You have a fever.  You are sick to your stomach (nauseous).  You are throwing up. Summary  A urinary tract infection (UTI) is an infection of any part of the urinary tract.  This condition is caused by germs in your genital area.  There are many risk factors for a UTI. These include having  a small, thin tube to drain pee and not being able to control when you pee or poop.  Treatment includes antibiotic medicines for germs.  Drink enough fluid to keep your pee pale yellow. This information is not intended to replace advice given to you by your health care provider. Make sure you discuss any questions you have with your health care provider. Document Revised: 06/20/2018 Document Reviewed: 01/10/2018 Elsevier Patient Education  [place]. Hematuria, Adult Hematuria is blood in the urine. Blood may be visible in the urine, or it may be identified with a test. This condition can be caused by infections of the bladder, urethra, kidney, or prostate. Other possible causes include:  Kidney stones.  Cancer of the urinary tract.  Too much calcium in the urine.  Conditions that are passed from parent to child (inherited conditions).  Exercise that requires a lot of energy. Infections can usually be treated with medicine, and a kidney stone usually will pass through your urine. If neither of these is the cause of your hematuria, more tests may be needed to identify the cause of your symptoms. It is very important to tell your health care provider about any blood in your urine, even if it is painless or the blood stops without treatment. Blood in the urine, when it happens and then stops and then happens again, can be a symptom of a very serious condition, including cancer. There is no pain in the initial stages of many urinary cancers. Follow these instructions at home: Medicines  Take over-the-counter and prescription medicines only as told by your health care provider.  If you were prescribed an antibiotic medicine, take it as told by your health care provider. Do not stop taking the antibiotic even if you start to feel better. Eating and drinking  Drink enough fluid to keep your urine clear or pale yellow. It is recommended that you drink 3-4 quarts (2.8-3.8 L) a day. If  you have been diagnosed with an infection, it is recommended that you drink cranberry juice in addition to large amounts of water.  Avoid caffeine, tea, and carbonated beverages. These tend to irritate the bladder.  Avoid alcohol because it may irritate the prostate (men). General instructions  If you have been diagnosed with a kidney stone, follow your health care provider's instructions about straining your urine to catch the stone.  Empty your bladder often. Avoid holding urine for long periods of time.  If you are female: ? After a bowel movement, wipe from front  to back and use each piece of toilet paper only once. ? Empty your bladder before and after sex.  Pay attention to any changes in your symptoms. Tell your health care provider about any changes or any new symptoms.  It is your responsibility to get your test results. Ask your health care provider, or the department performing the test, when your results will be ready.  Keep all follow-up visits as told by your health care provider. This is important. Contact a health care provider if:  You develop back pain.  You have a fever.  You have nausea or vomiting.  Your symptoms do not improve after 3 days.  Your symptoms get worse. Get help right away if:  You develop severe vomiting and are unable take medicine without vomiting.  You develop severe pain in your back or abdomen even though you are taking medicine.  You pass a large amount of blood in your urine.  You pass blood clots in your urine.  You feel very weak or like you might faint.  You faint. Summary  Hematuria is blood in the urine. It has many possible causes.  It is very important that you tell your health care provider about any blood in your urine, even if it is painless or the blood stops without treatment.  Take over-the-counter and prescription medicines only as told by your health care provider.  Drink enough fluid to keep your urine  clear or pale yellow. This information is not intended to replace advice given to you by your health care provider. Make sure you discuss any questions you have with your health care provider. Document Revised: 11/27/2018 Document Reviewed: 08/05/2016 Elsevier Patient Education  2020 [employer].

## 2019-07-31 ENCOUNTER — Telehealth: Payer: Self-pay

## 2019-07-31 NOTE — Telephone Encounter (Signed)
Copied from [place] [id number]. Topic: Referral - Status >> Jul 31, 2019 12:51 PM Richardo Priest, [place] wrote: Reason for CRM: Pt called in in regards to referral. Pt would like to see Dr.Stoioff at [facility].

## 2019-07-31 NOTE — Telephone Encounter (Signed)
A referral was put in on this patient today for Urology. Please see patient request below.KW

## 2019-08-01 LAB — URINE CULTURE

## 2019-08-01 NOTE — Progress Notes (Signed)
   Advise to still keep urology follow up.  Phone call on 07/23/2019 reported patient had taken antibiotics. Either way and even more so with negative culture she needs follow up with urology for evaluation given chronic reoccurrence and hematuria in ER on 07/19/2019 when patient left ER not seen.  Return if needed.

## 2019-08-02 IMAGING — CR DG LUMBAR SPINE COMPLETE 4+V
1 series · 6 of 6 positions shown · non-contrast
Comparison: Lumbar spine radiographs May 16, 2016; lumbar MRI
February 12, 2017

CLINICAL DATA: Pain following fall

EXAM:
LUMBAR SPINE - COMPLETE 4+ VIEW

[Series 1: dg lumbar spine complete 4 +v · 0.14mm/px · 6 of 6 slices shown]
[im 1/6]
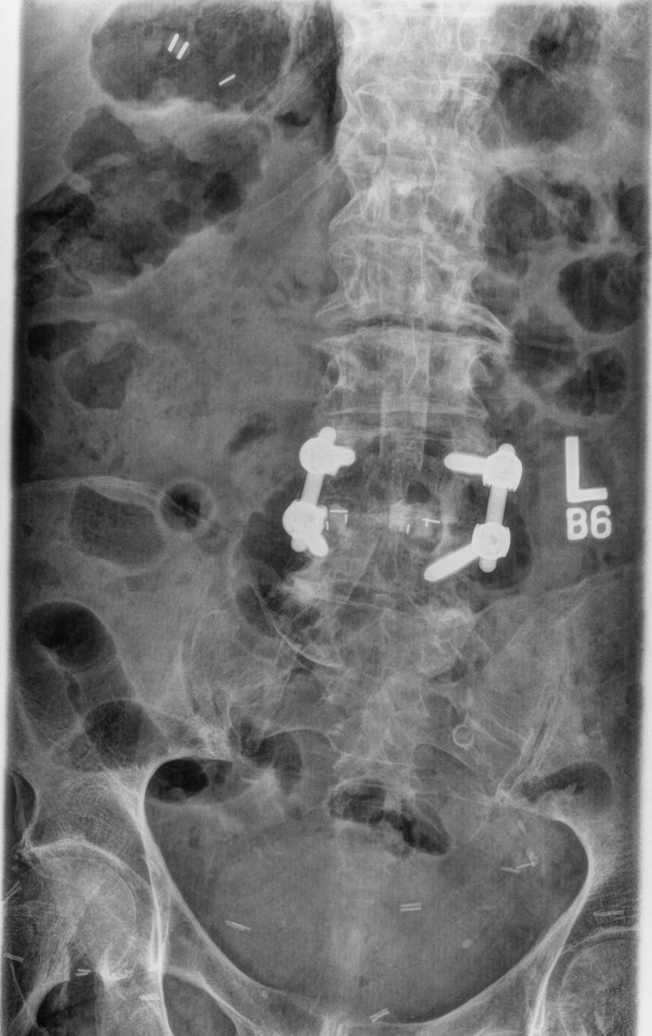
[im 2/6]
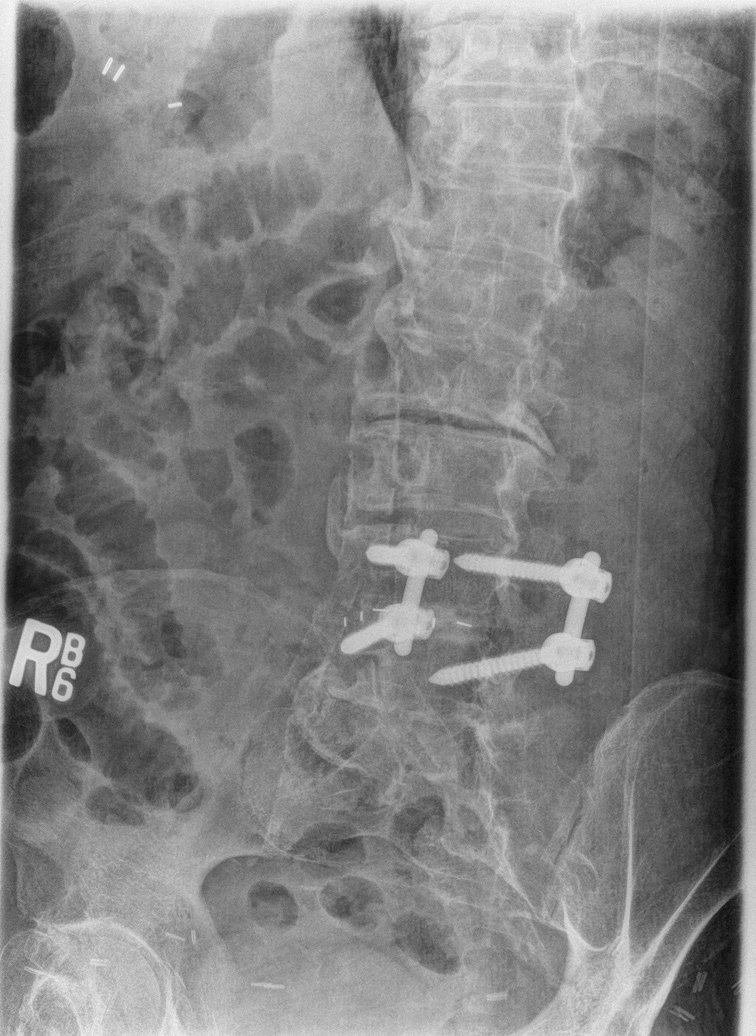
[im 3/6]
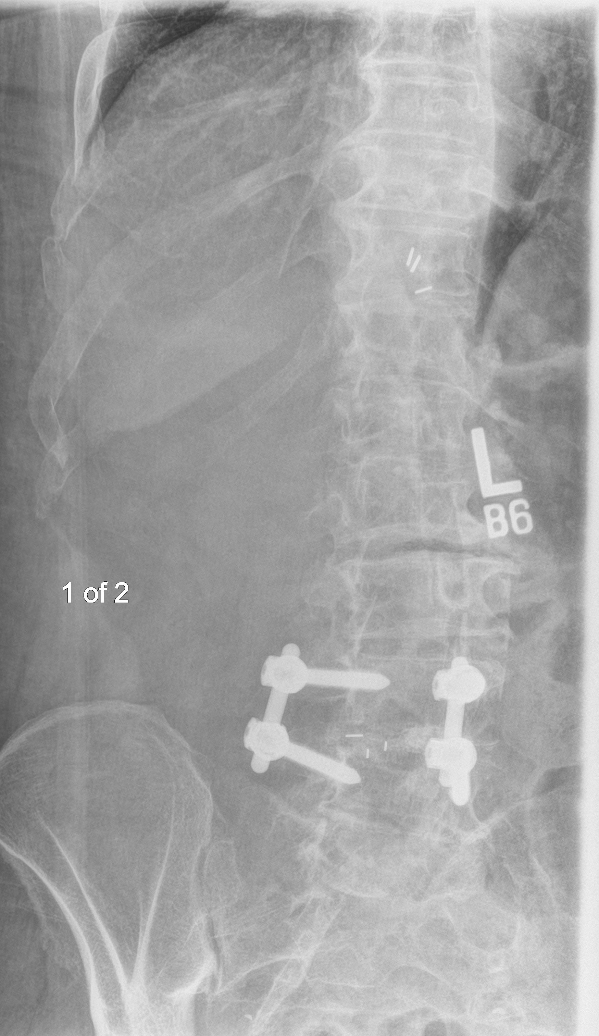
[im 4/6]
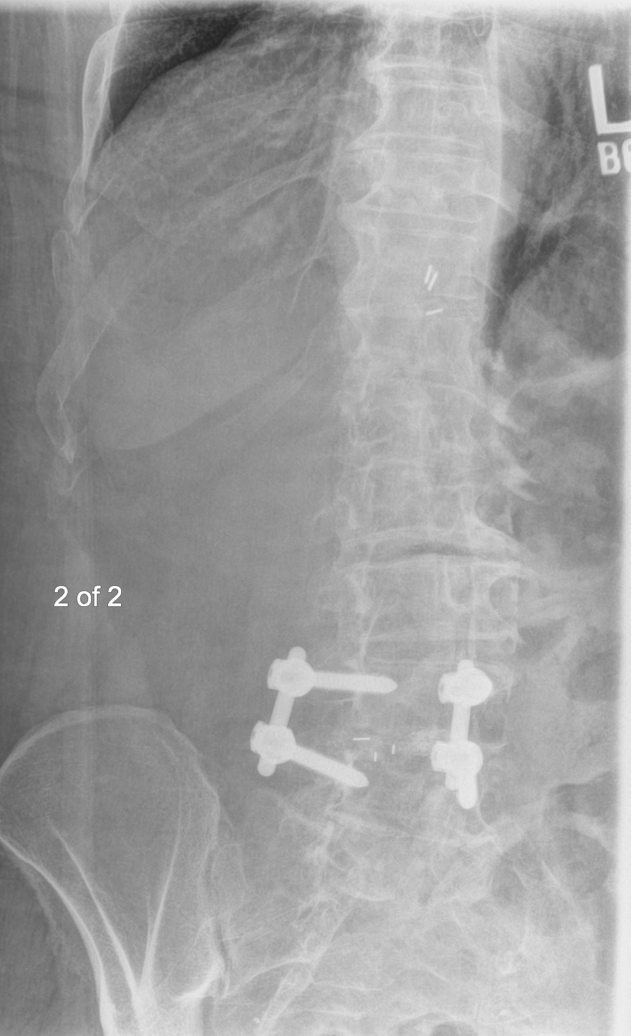
[im 5/6]
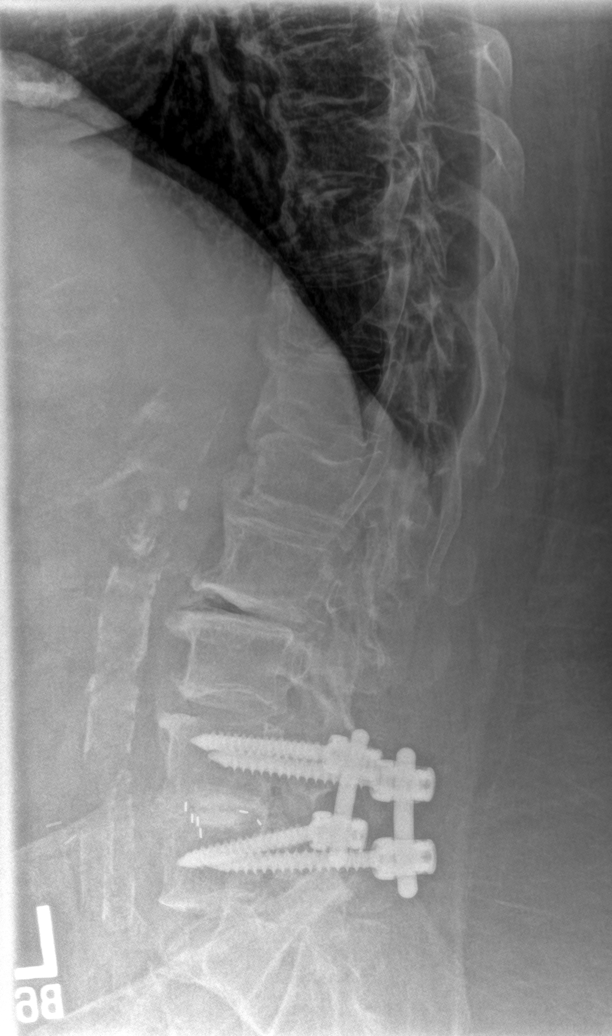
[im 6/6]
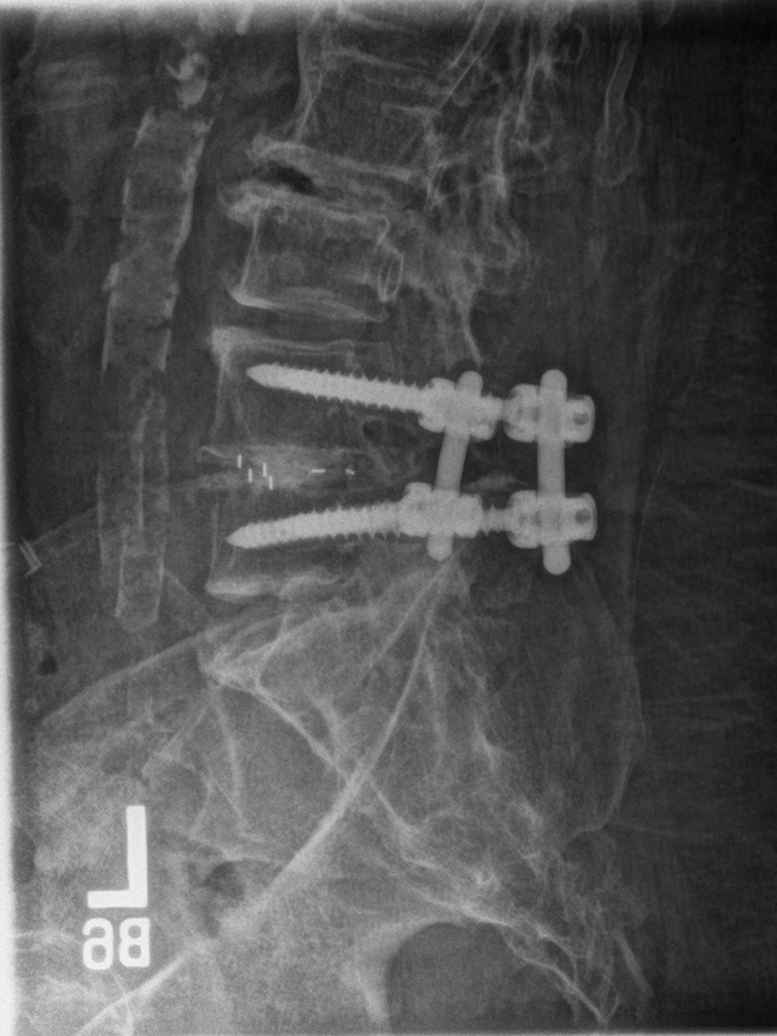

[6 of 6 positions shown; findings below may reference images not displayed]

FINDINGS: Frontal, lateral, spot lumbosacral lateral, and bilateral oblique
views were obtained. There are 5 non-rib-bearing lumbar type
vertebral bodies. There is posterior screw and plate fixation at L4
and L5. pedicle screw tips are in the respective vertebral bodies.
Disc spacer at L4-5. No acute fracture or spondylolisthesis. There
is severe disc space narrowing at L2-3 and L4-5, stable. There is
moderate disc space narrowing at all other levels. There is facet
osteoarthritic change at all levels bilaterally.

There is aortoiliac atherosclerosis.
IMPRESSION: Postoperative change at L4 and L5. Extensive multilevel
osteoarthritic change persists. No fracture or spondylolisthesis.
There is aortoiliac atherosclerotic change.

Aortic Atherosclerosis ([id number].T).

## 2019-08-02 IMAGING — CR DG THORACIC SPINE 2V
1 series · 5 of 5 positions shown · non-contrast
Comparison: Chest radiograph November 22, 2016

CLINICAL DATA: Chronic dorsalgia

EXAM:
THORACIC SPINE 3 VIEWS

[Series 1: dg thoracic spine 2 view · 0.14mm/px · 5 of 5 slices shown]
[im 1/5]
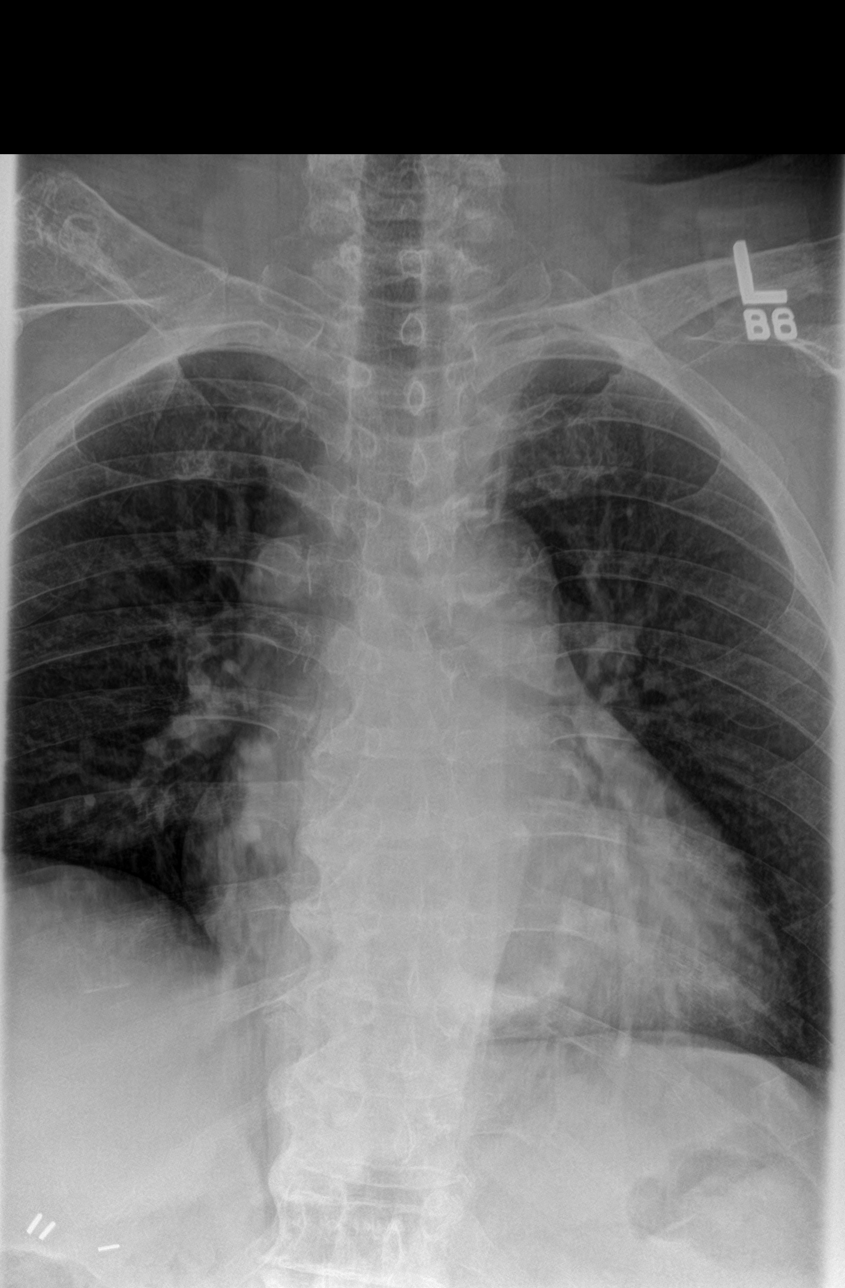
[im 2/5]
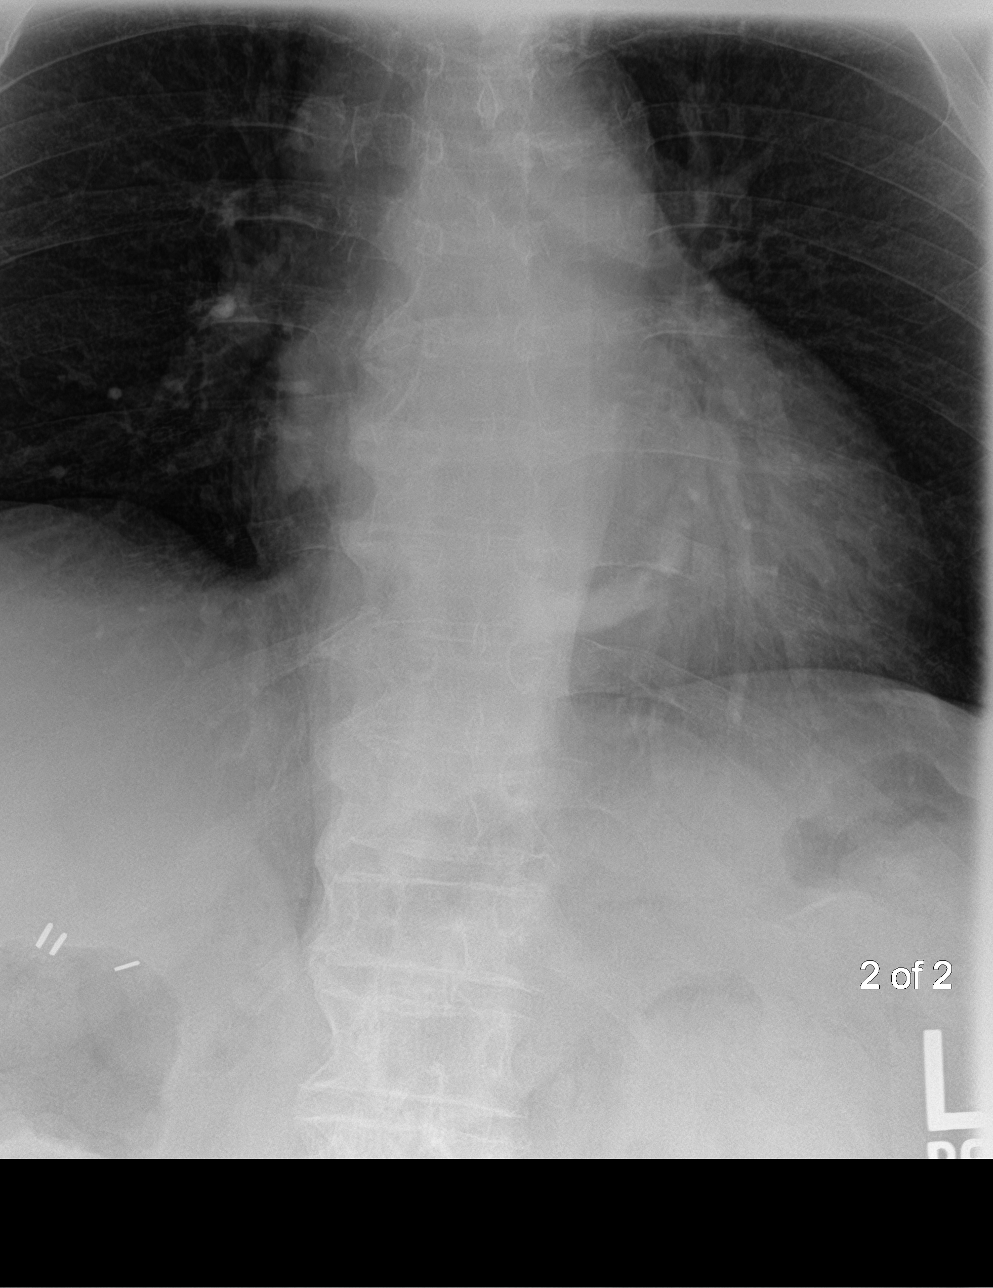
[im 3/5]
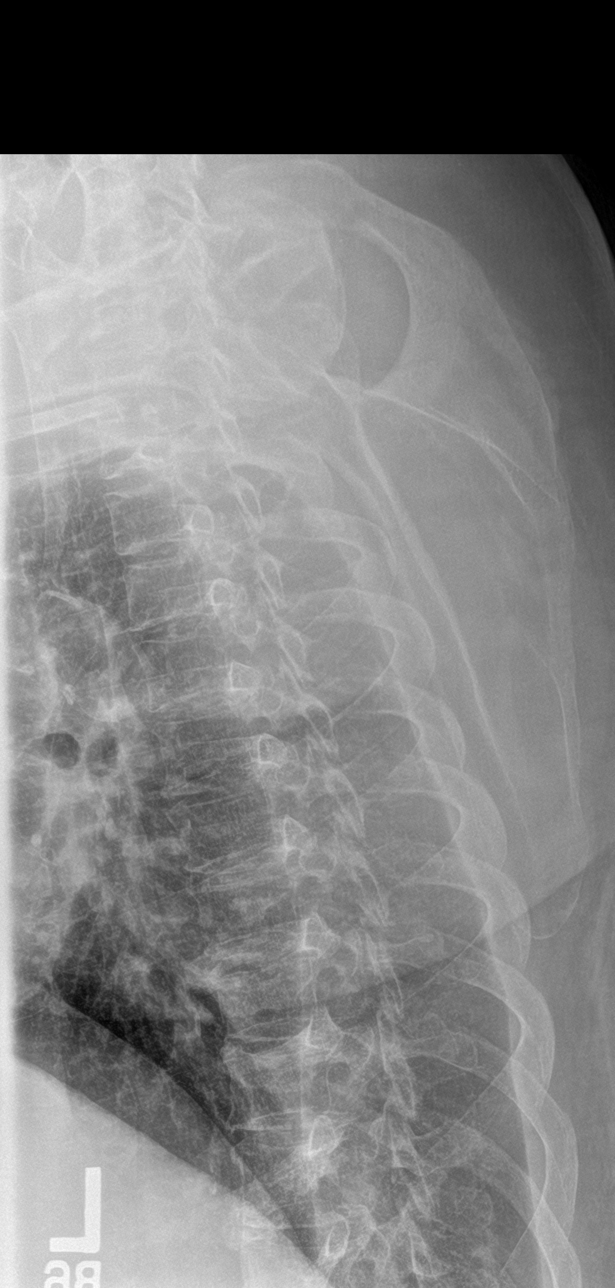
[im 4/5]
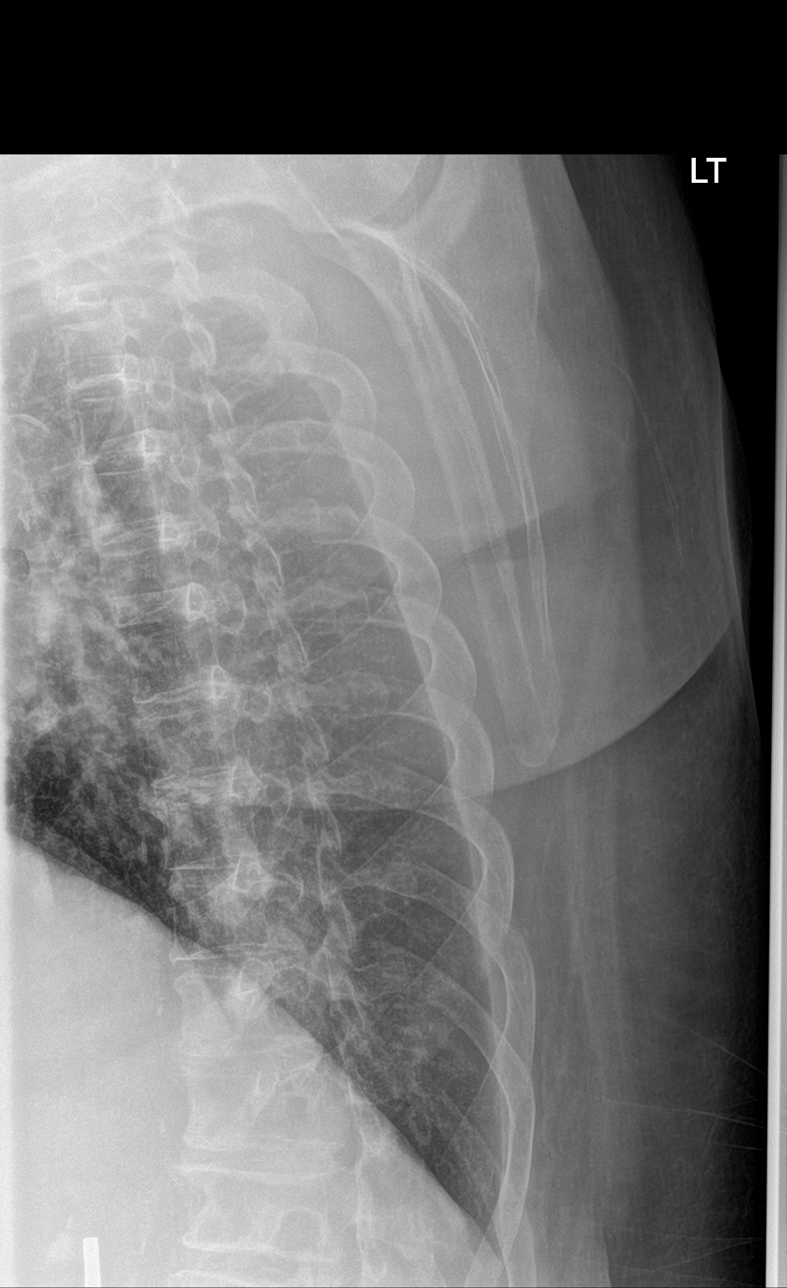
[im 5/5]
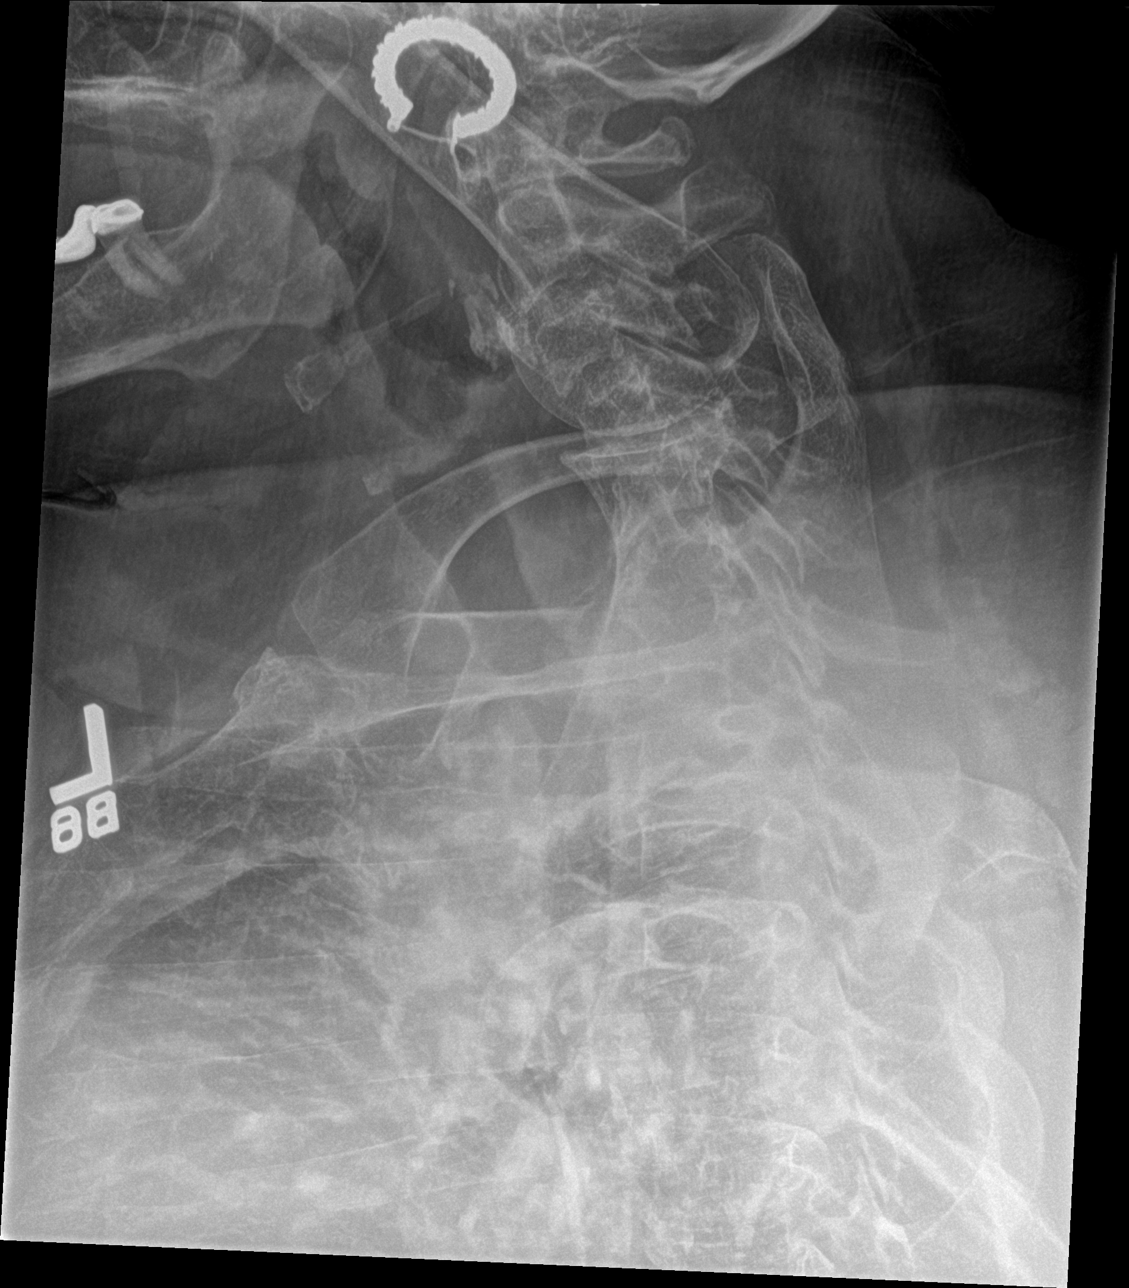

[5 of 5 positions shown; findings below may reference images not displayed]

FINDINGS: Frontal, lateral, and swimmer's views were obtained.. There is no
fracture or spondylolisthesis. There is moderate disc space
narrowing at multiple levels. No erosive change. There are
right-sided lateral osteophytes at multiple levels. No paraspinous
lesions.
IMPRESSION: Mild osteoarthritic change at multiple levels. No fracture or
spondylolisthesis.

## 2019-08-02 IMAGING — CT CT HEAD W/O CM
3 series · 15 of 47 positions shown, 18 images · non-contrast
Comparison: 10/15/2016

CLINICAL DATA: Confusion, slurred speech and LEFT-sided weakness
since 0222 hours, suspected stroke greater than 6 hours old

EXAM:
CT HEAD WITHOUT CONTRAST
TECHNIQUE: Contiguous axial images were obtained from the base of the skull
through the vertex without intravenous contrast.

[Series 2: head wo · axial · 0.41mm/px · z∈[-148,-23]mm · 9 of 30 slices shown, 12 images]
[im 3/30  brain]
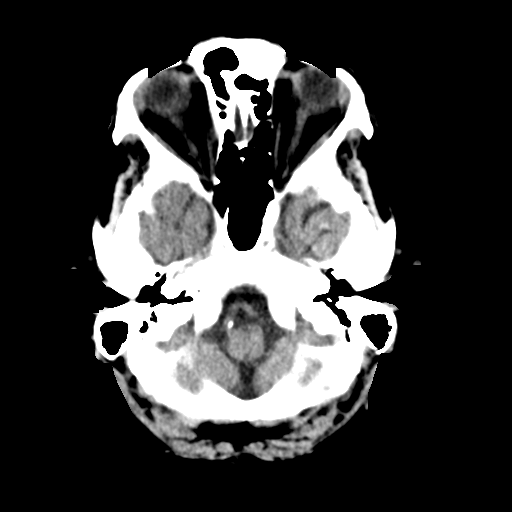
[im 3/30  bone]
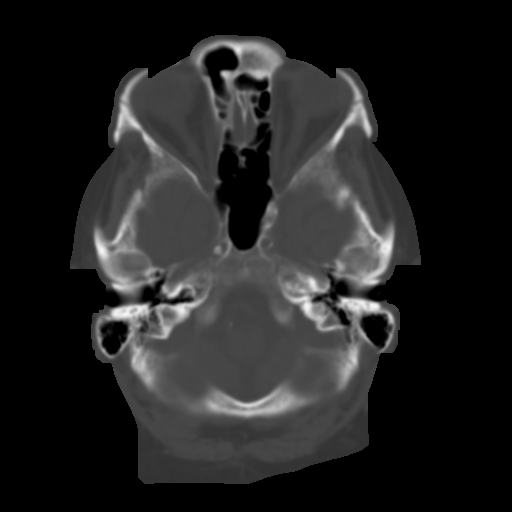
[im 6/30  brain]
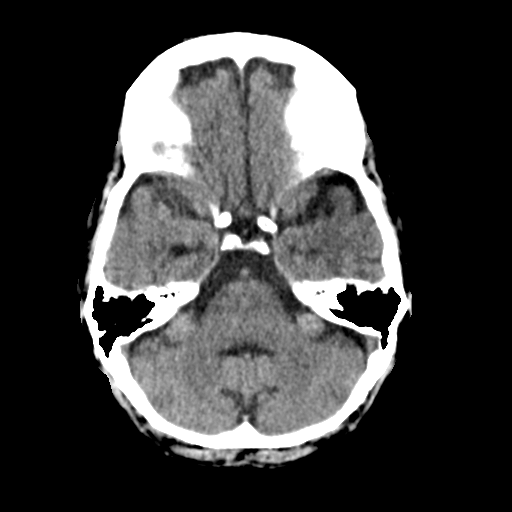
[im 9/30  brain]
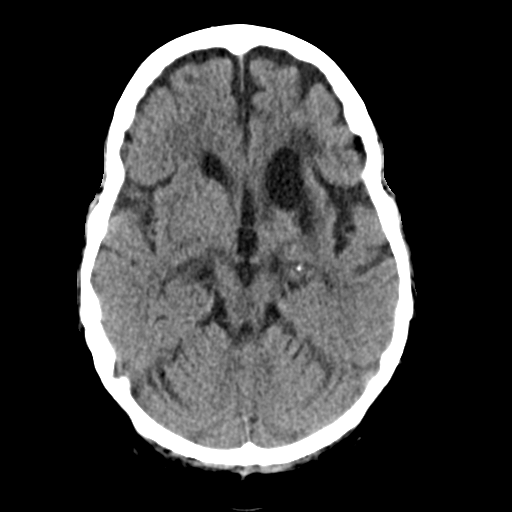
[im 12/30  brain]
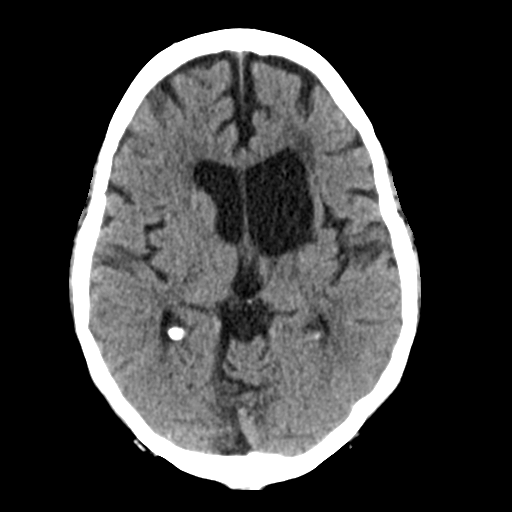
[im 16/30  brain]
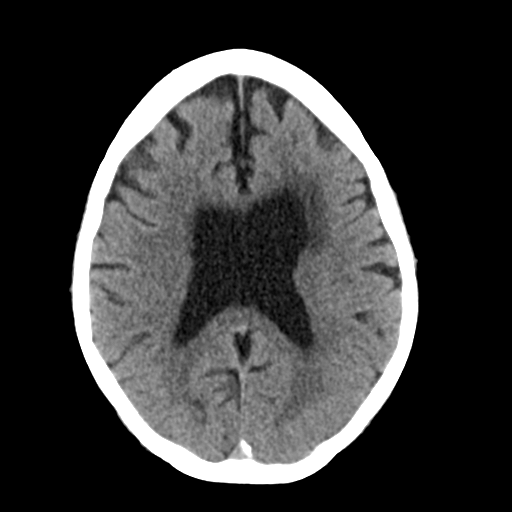
[im 16/30  bone]
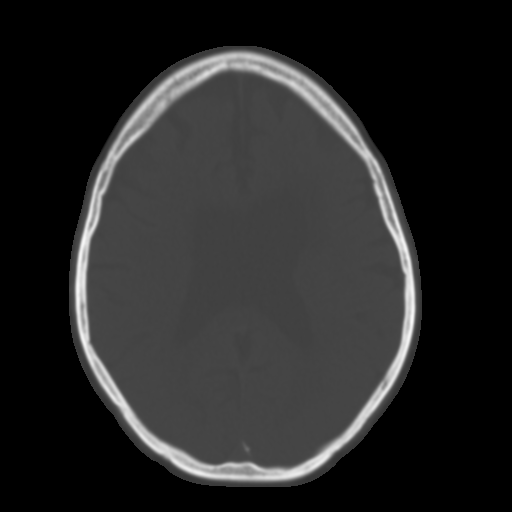
[im 19/30  brain]
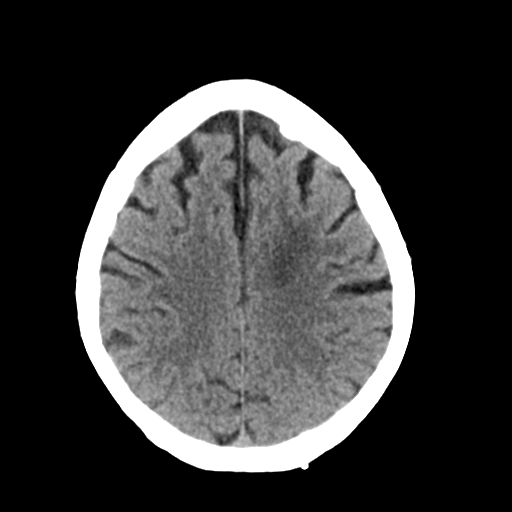
[im 22/30  brain]
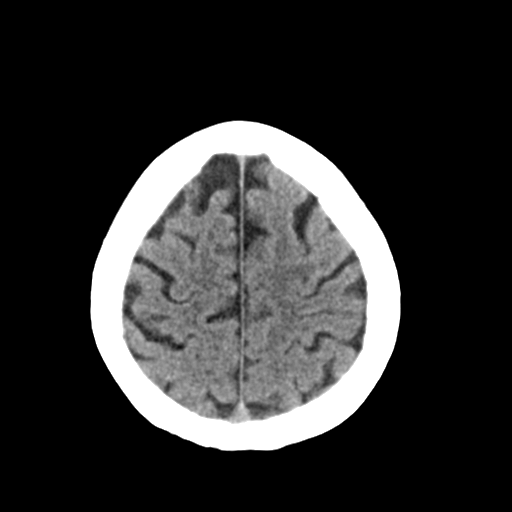
[im 25/30  brain]
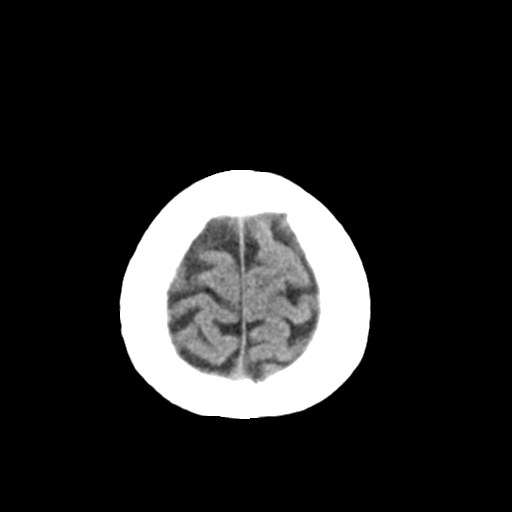
[im 28/30  brain]
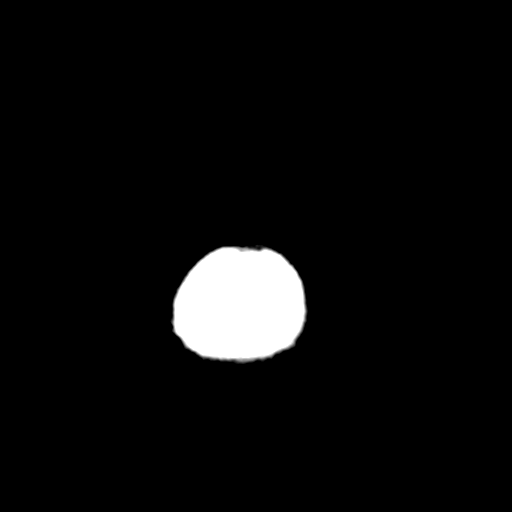
[im 28/30  bone]
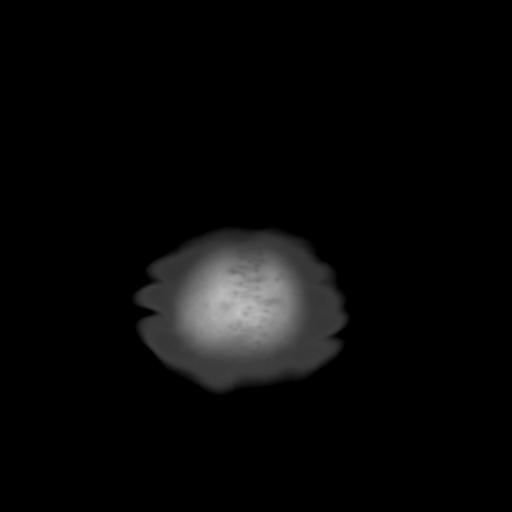

[Series 4: coronal soft tissue · coronal · 0.32mm/px · 3 of 64 slices shown]
[im 22/64  brain]
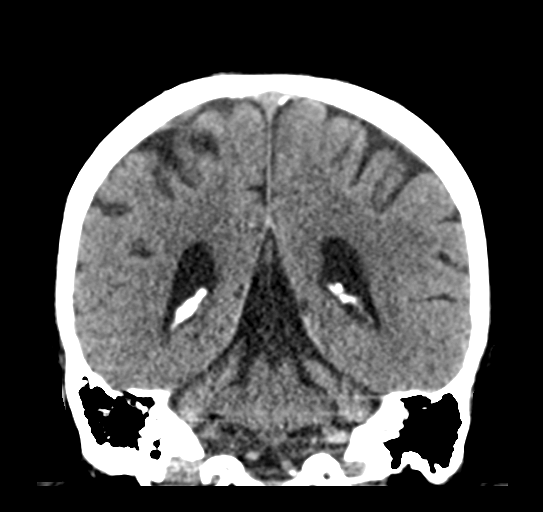
[im 29/64  brain]
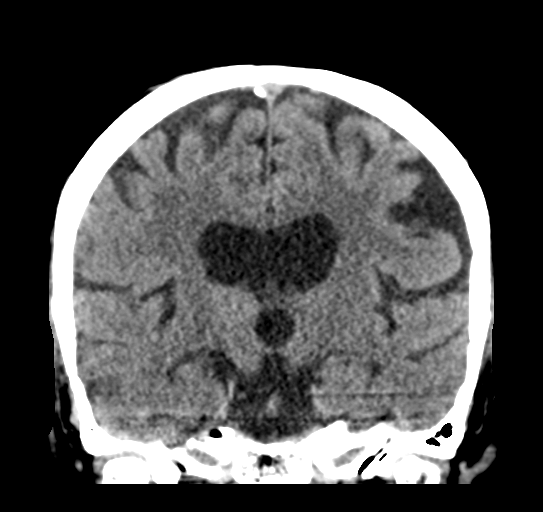
[im 36/64  brain]
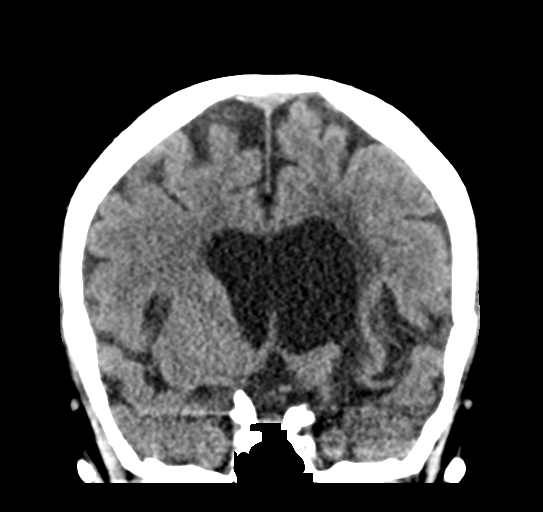

[Series 5: sagittal soft tissue · sagittal · 0.31mm/px · 3 of 49 slices shown]
[im 17/49  brain]
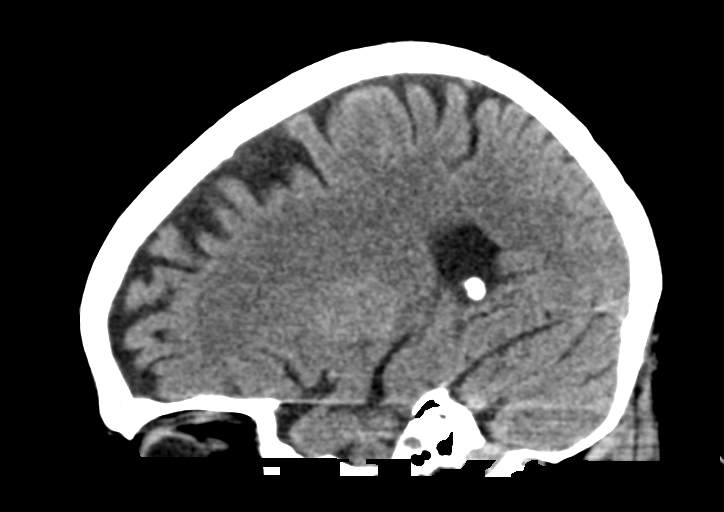
[im 25/49  brain]
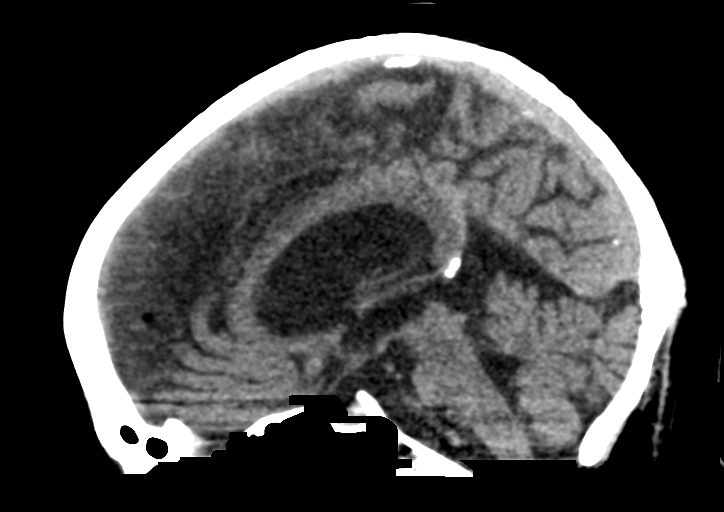
[im 33/49  brain]
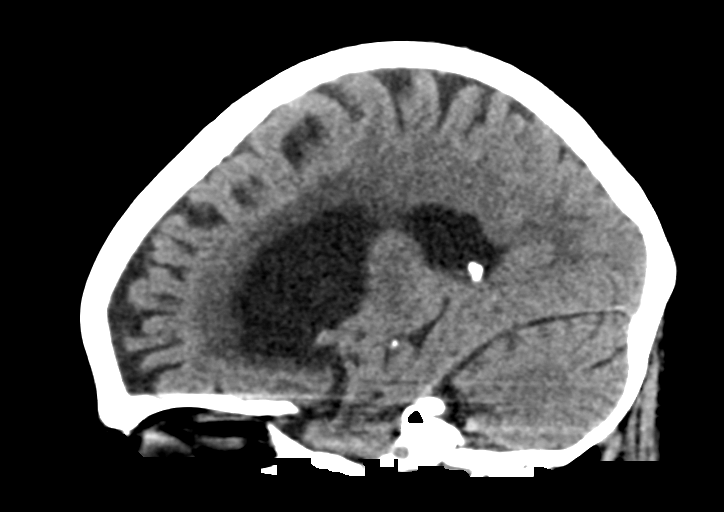

[15 of 47 positions shown; findings below may reference images not displayed]

FINDINGS: Brain: Generalized atrophy. Ex vacuo dilatation of the anterior LEFT
lateral ventricle unchanged. No midline shift or mass effect. Small
vessel chronic ischemic changes of deep cerebral white matter. Old
infarcts involving the LEFT basal ganglia, LEFT caudate lobe and
anterior limb of LEFT internal capsule with encephalomalacia. No
intracranial hemorrhage, mass lesion or definite evidence of acute
infarction. No extra-axial fluid collections.

Vascular: Extensive atherosclerotic calcifications of internal
carotid and vertebral arteries at skull base

Skull: Intact

Sinuses/Orbits: Clear

Other: N/A
IMPRESSION: Atrophy with small vessel chronic ischemic changes of deep cerebral
white matter.

Encephalomalacia at sites of prior lacunar infarcts involving the
LEFT basal ganglia, LEFT caudate and anterior limb LEFT internal
capsule with ex vacuo dilatation of anterior LEFT lateral ventricle.

No new intracranial abnormalities identified.

## 2019-08-03 IMAGING — MR MR MRA HEAD W/O CM
8 of 11 series · 28 of 48 positions shown · non-contrast
Comparison: Prior CT from 10/03/2017.

CLINICAL DATA: 61-year-old female with history of prior stroke with
chronic left-sided weakness, presenting with worsening left-sided
weakness.

EXAM:
MRI HEAD WITHOUT CONTRAST
MRA HEAD WITHOUT CONTRAST
TECHNIQUE: Multiplanar, multiecho pulse sequences of the brain and surrounding
structures were obtained without intravenous contrast. Angiographic
images of the head were obtained using MRA technique without
contrast.

[Series 2: T1 · sagittal · 5.0mm · 0.45mm/px · 3 of 27 slices shown (1 of 2)]
[im 1/27]
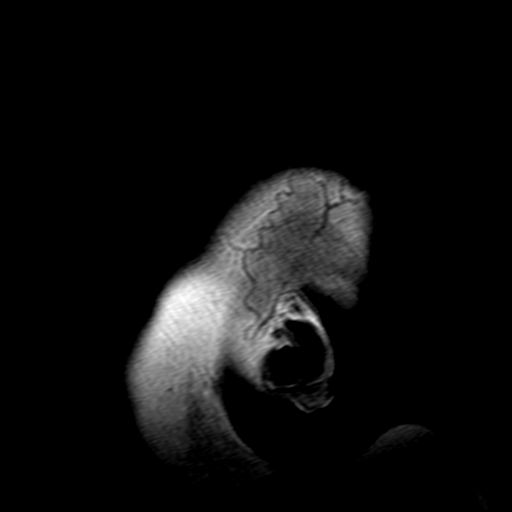
[im 14/27]
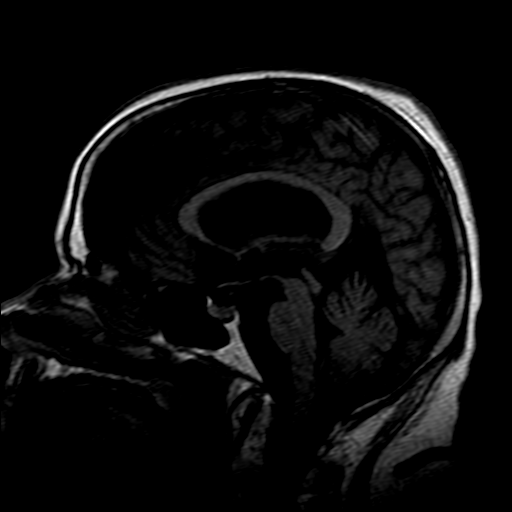
[im 27/27]
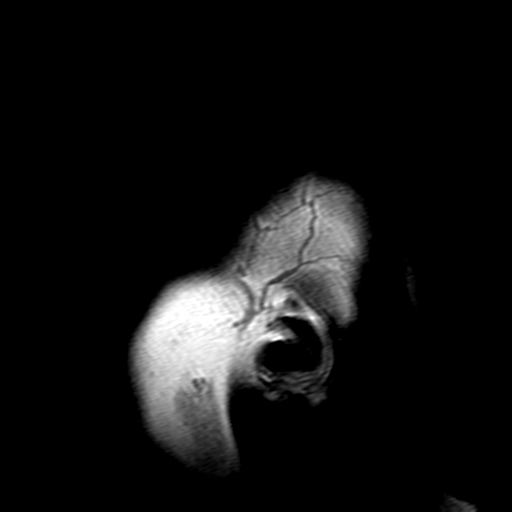

[Series 4: DWI · axial · 4.0mm · 0.94mm/px · z∈[-134,+22]mm · 4 of 40 slices shown (1 of 2)]
[im 1/40]
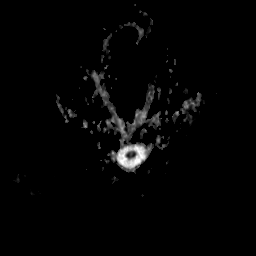
[im 14/40]
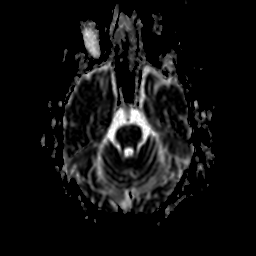
[im 27/40]
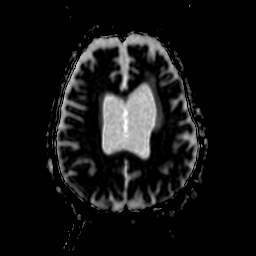
[im 40/40]
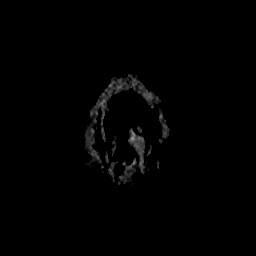

[Series 6: DWI · coronal · 5.0mm · 1.80mm/px · 4 of 36 slices shown (2 of 2)]
[im 1/36]
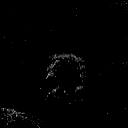
[im 12/36]
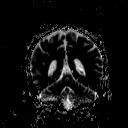
[im 24/36]
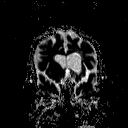
[im 36/36]
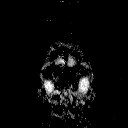

[Series 13: T2 · axial · 5.0mm · 0.45mm/px · z∈[-131,+24]mm · 3 of 25 slices shown (1 of 3)]
[im 1/25]
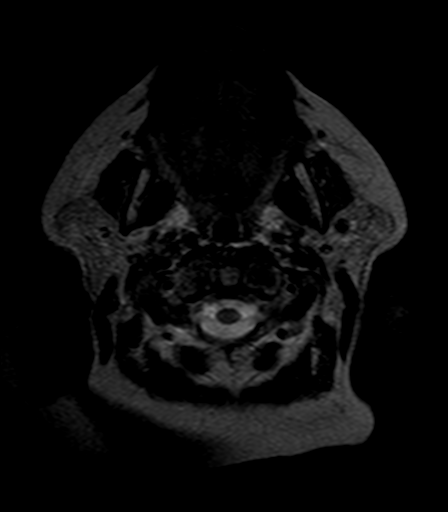
[im 13/25]
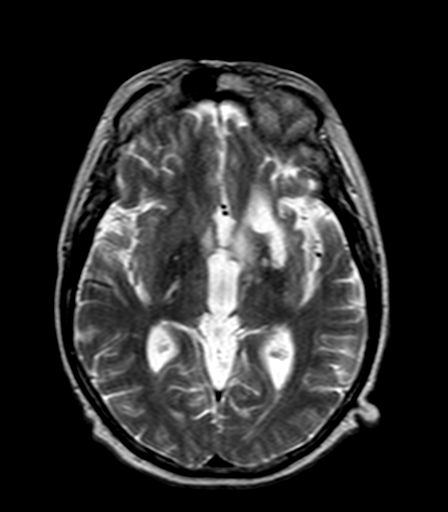
[im 25/25]
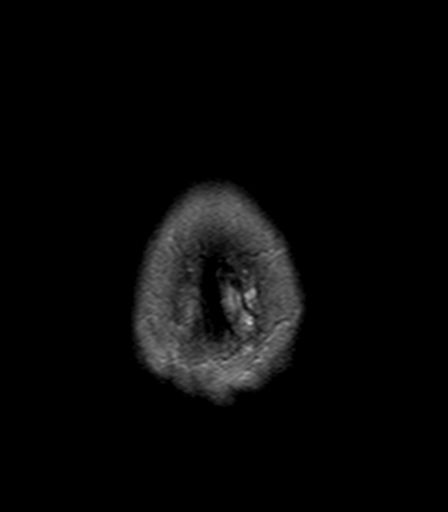

[Series 16: FLAIR · axial · 5.0mm · 0.90mm/px · z∈[-130,+25]mm · 3 of 25 slices shown]
[im 1/25]
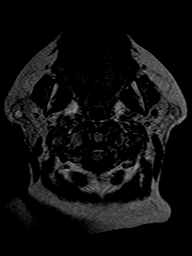
[im 13/25]
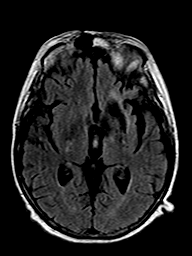
[im 25/25]
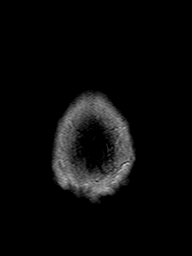

[Series 18: T2 · axial · 5.0mm · 0.45mm/px · z∈[-131,+24]mm · 3 of 25 slices shown (2 of 3)]
[im 1/25]
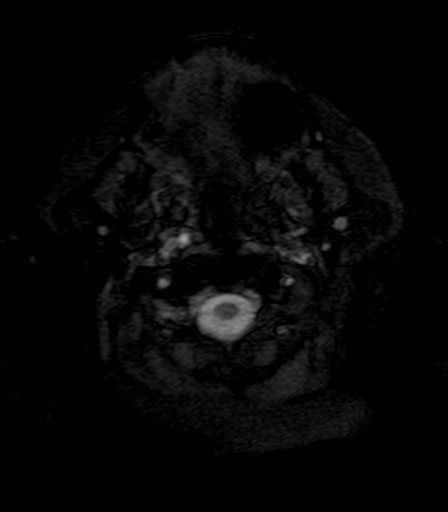
[im 13/25]
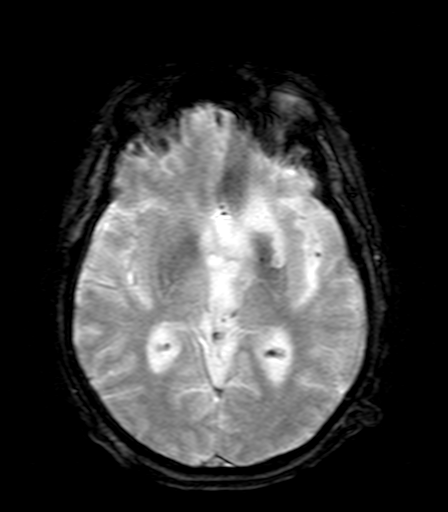
[im 25/25]
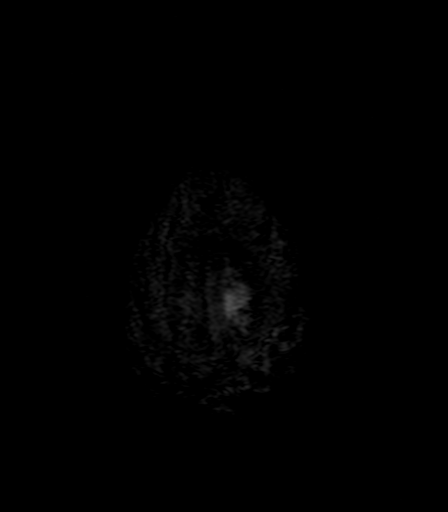

[Series 19: T1 · axial · 3.0mm · 0.45mm/px · z∈[-141,-25]mm · 5 of 60 slices shown (2 of 2)]
[im 1/60]
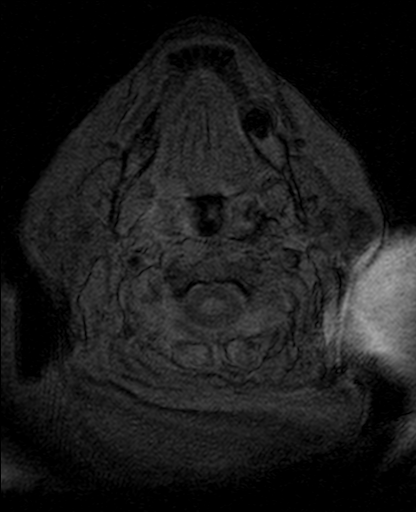
[im 10/60]
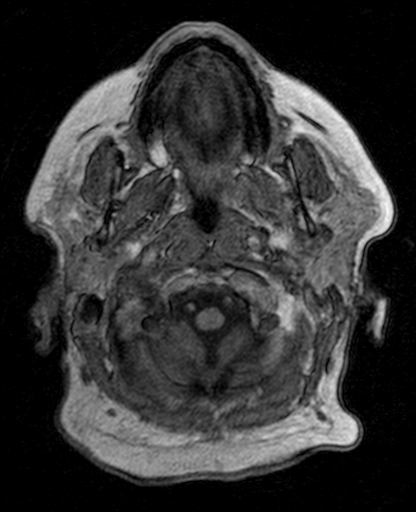
[im 20/60]
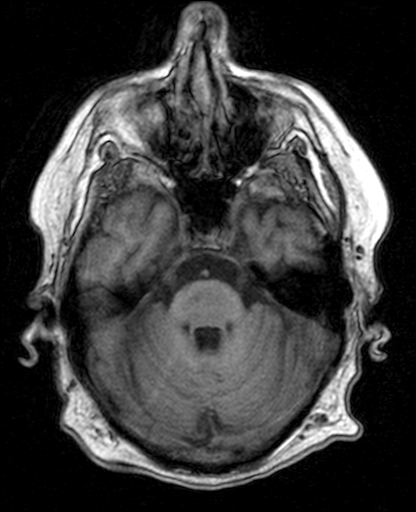
[im 30/60]
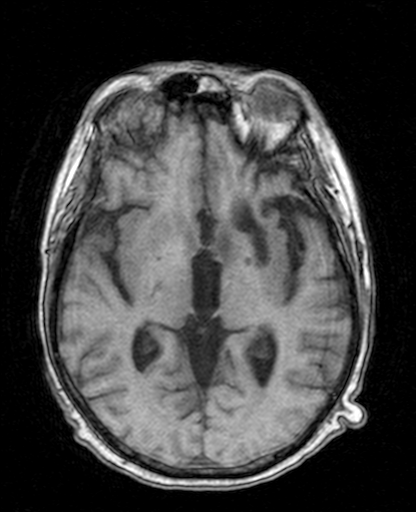
[im 40/60]
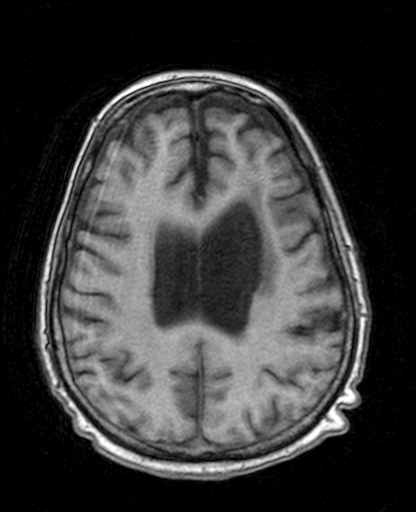

[Series 20: T2 · coronal · 5.0mm · 0.45mm/px · 3 of 27 slices shown (3 of 3)]
[im 1/27]
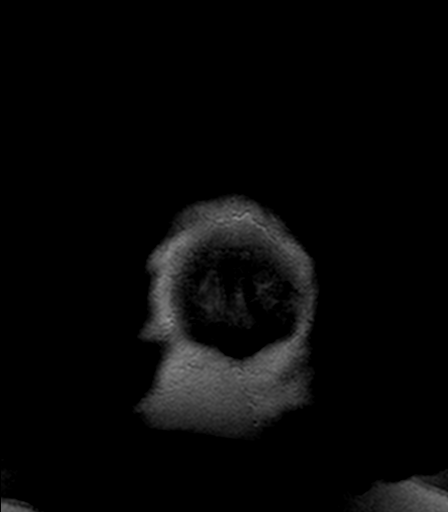
[im 14/27]
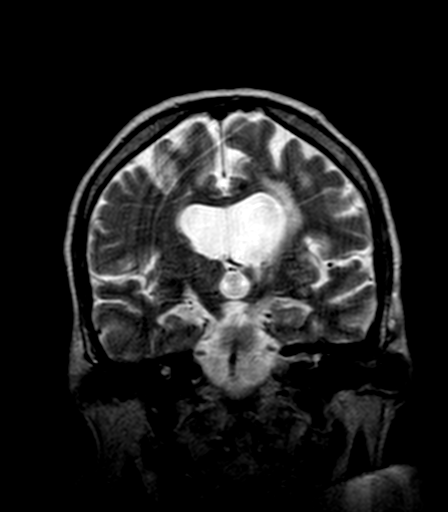
[im 27/27]
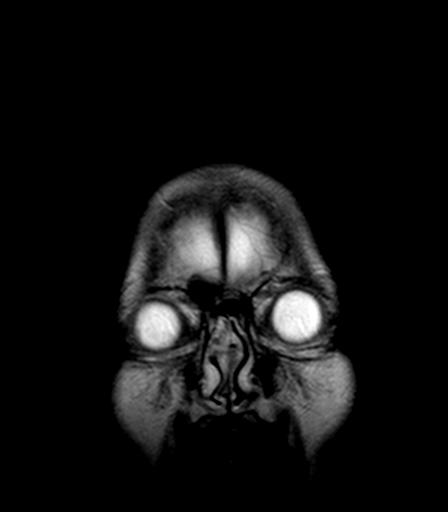

[28 of 48 positions shown; findings below may reference images not displayed]

FINDINGS: MRI HEAD FINDINGS

Brain: Generalized age-related cerebral atrophy. Remote lacunar
infarct involving the left caudate, internal capsule, and lentiform
nuclei again seen. Associated encephalomalacia with ex vacuo
dilatation of the left lateral ventricle. Small amount of chronic
blood products within this region. Mild chronic microvascular
ischemic changes present within the periventricular white matter.

No abnormal foci of restricted diffusion to suggest acute or
subacute ischemia. Gray-white matter differentiation otherwise
maintained. No other areas of remote cortical infarction. No acute
intracranial hemorrhage.

No mass lesion, midline shift or mass effect. Ventricular legally
related to prior infarct without hydrocephalus. No extra-axial fluid
collection. Major dural sinuses are grossly patent.

Pituitary gland suprasellar region within normal limits. Midline
structures intact and normal.

Vascular: Major intracranial vascular flow voids are maintained.

Skull and upper cervical spine: Craniocervical junction normal.
Upper cervical spine normal. Bone marrow signal intensity within
normal limits. No scalp soft tissue abnormality.

Sinuses/Orbits: Globes normal soft tissues within normal limits.
Patient status post cataract extraction bilaterally. Paranasal
sinuses are largely clear. No mastoid effusion. Inner ear structures
normal.

Other: None.

MRA HEAD FINDINGS

ANTERIOR CIRCULATION:

Examination mildly degraded by motion artifact.

Distal cervical segments of the internal carotid arteries are widely
patent with antegrade flow. Petrous segments widely patent. Mild
atheromatous irregularity within the cavernous/supraclinoid ICAs
without flow-limiting stenosis. Origin of the ophthalmic arteries
normal. ICA termini widely patent. A1 segments patent bilaterally.
Normal and patent anterior communicating artery. Anterior cerebral
arteries patent to their distal aspects without stenosis. No M1
stenosis or occlusion. No proximal M2 occlusion. Distal MCA branches
well perfused and symmetric, but demonstrate distal small vessel
atheromatous irregularity.

POSTERIOR CIRCULATION:

Visualized distal V4 segments widely patent. Posterior inferior
cerebral arteries patent proximally. Basilar artery widely patent to
its distal aspect. Superior cerebral arteries patent bilaterally.
Both of the posterior cerebral arteries primarily supplied via the
basilar. PCAs demonstrate atheromatous irregularity without
high-grade stenosis. Left PCA patent to its distal aspect. Distal
right PCA branches attenuated.

No aneurysm.
IMPRESSION: MRI HEAD IMPRESSION:

1. No acute intracranial infarct or other abnormality identified.
2. Chronic left basal ganglia lacunar infarct with associated ex
vacuo dilatation of the left lateral ventricle.
3. Mild chronic small vessel ischemic disease.

MRA HEAD IMPRESSION:

1. Negative intracranial MRA with no evidence for large vessel
occlusion. No high-grade or proximal correctable stenosis.
2. Mild distal small vessel atheromatous irregularity.

## 2019-08-03 IMAGING — US US CAROTID DUPLEX BILAT
1 series · 13 of 24 positions shown · non-contrast
Comparison: 07/08/2016

CLINICAL DATA: CVA

EXAM:
BILATERAL CAROTID DUPLEX ULTRASOUND
TECHNIQUE: Gray scale imaging, color Doppler and duplex ultrasound were
performed of bilateral carotid and vertebral arteries in the neck.

[Series 1: us carotid duplex bilat · 13 of 74 slices shown]
[im 1/74]
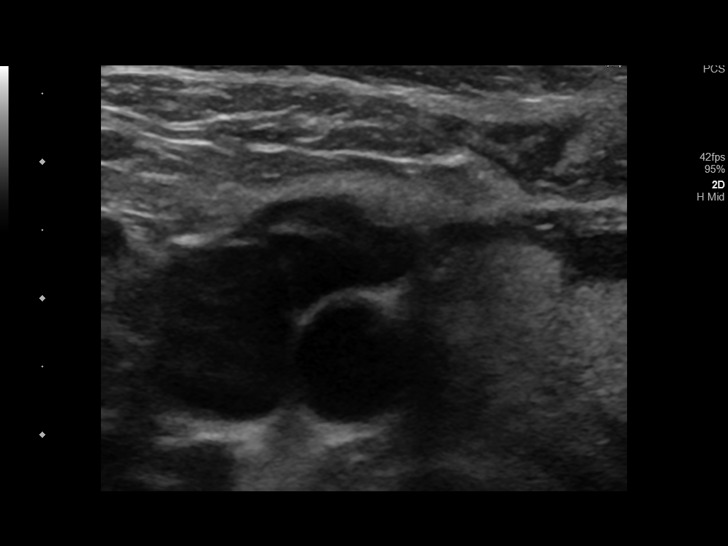
[im 7/74]
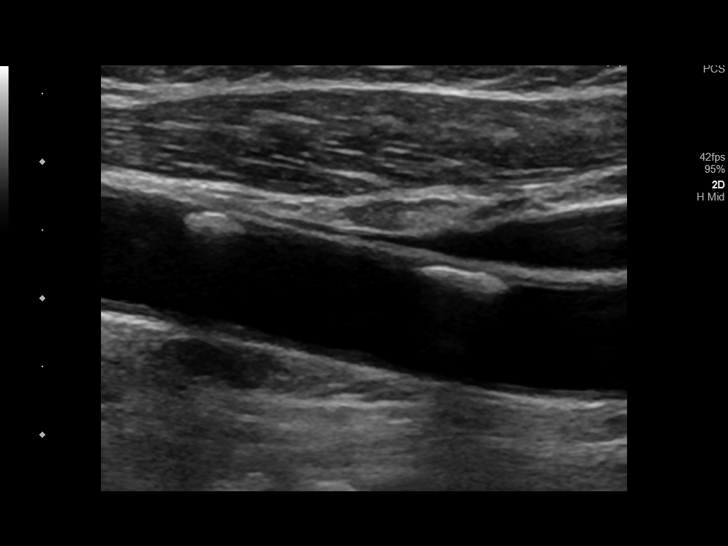
[im 13/74]
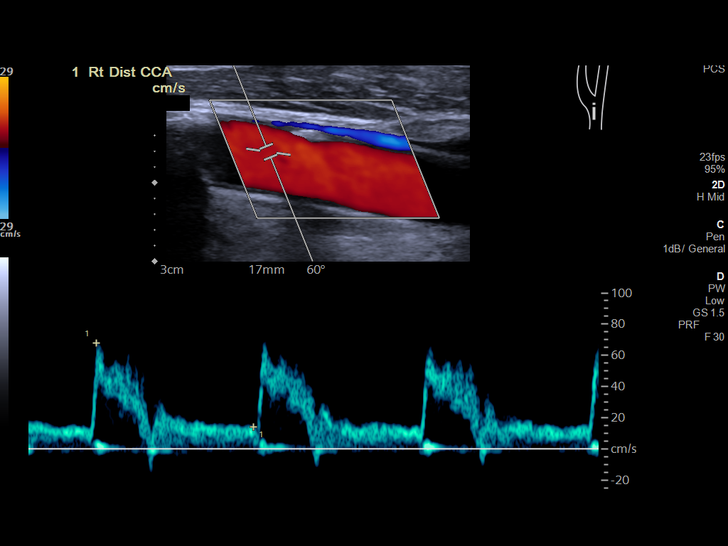
[im 20/74]
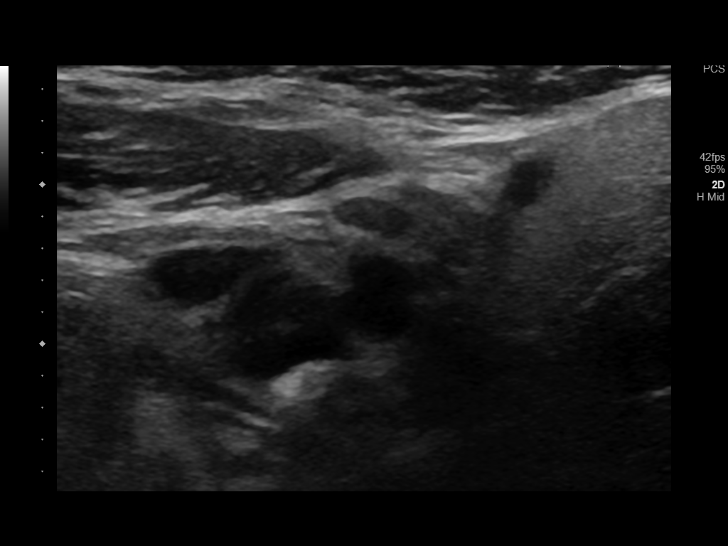
[im 26/74]
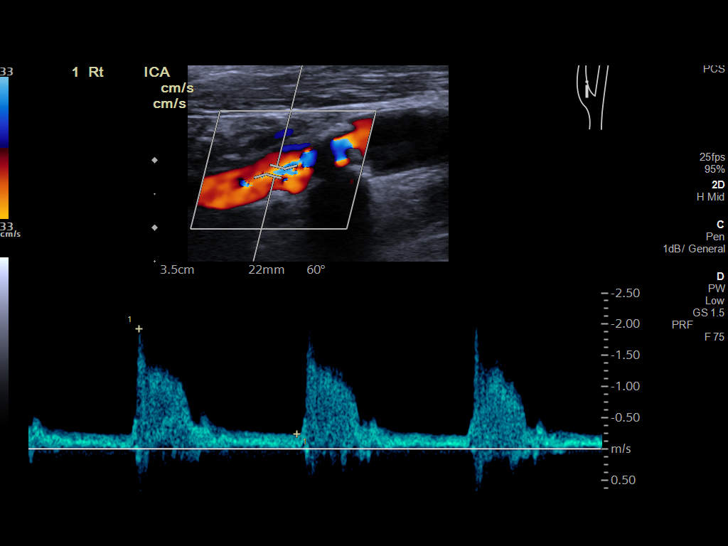
[im 32/74]
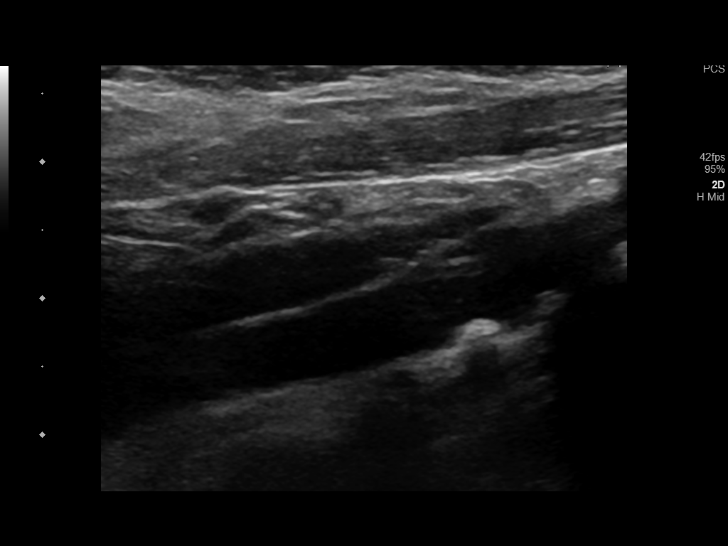
[im 39/74]
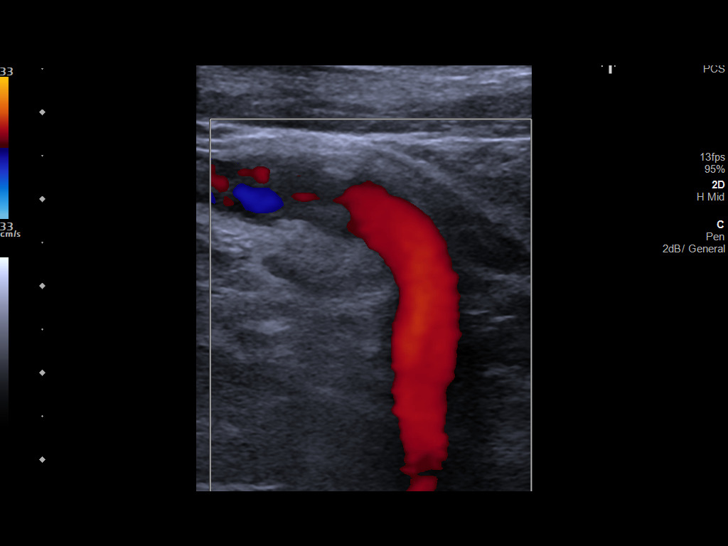
[im 42/74]
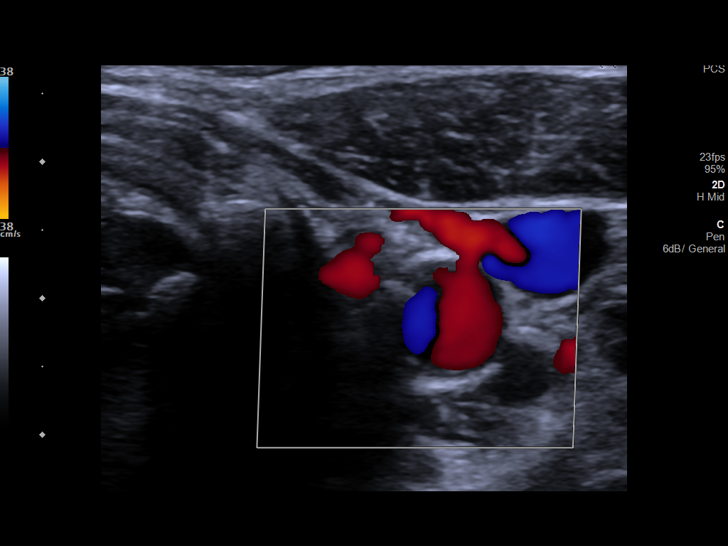
[im 48/74]
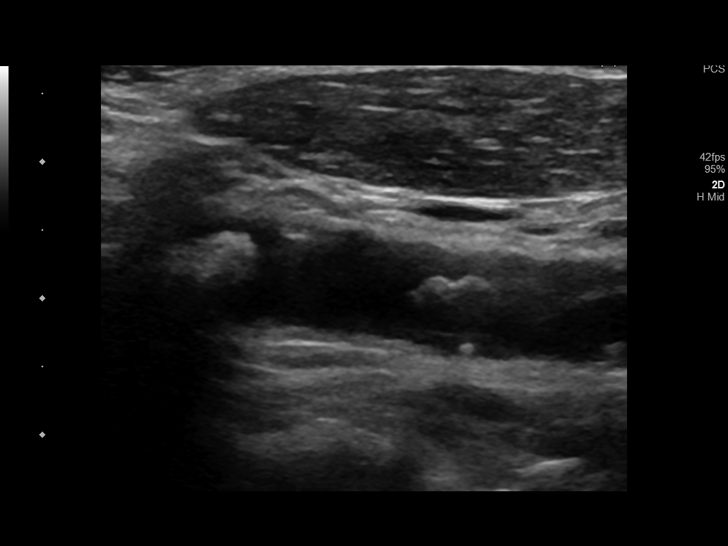
[im 54/74]
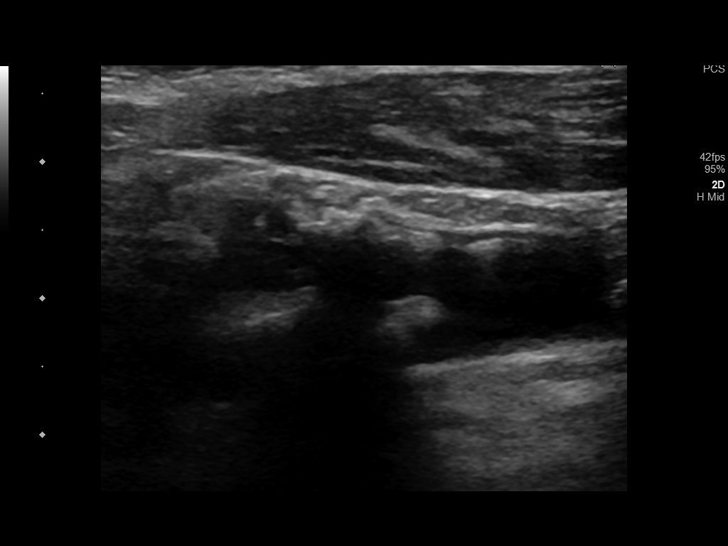
[im 61/74]
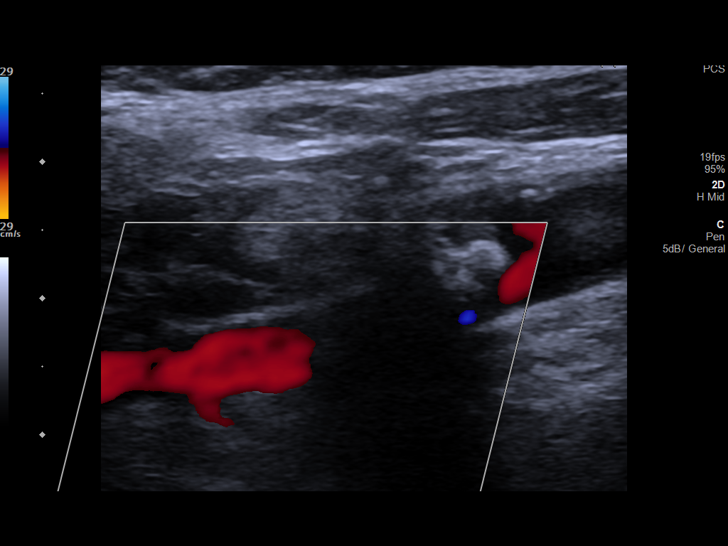
[im 67/74]
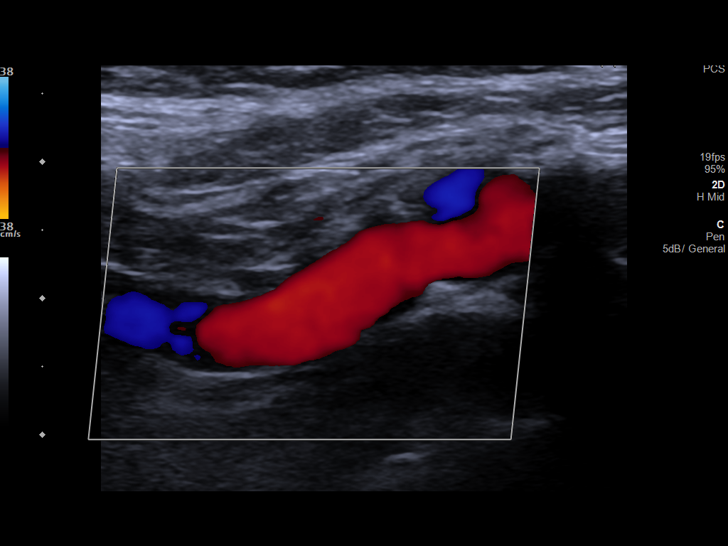
[im 74/74]
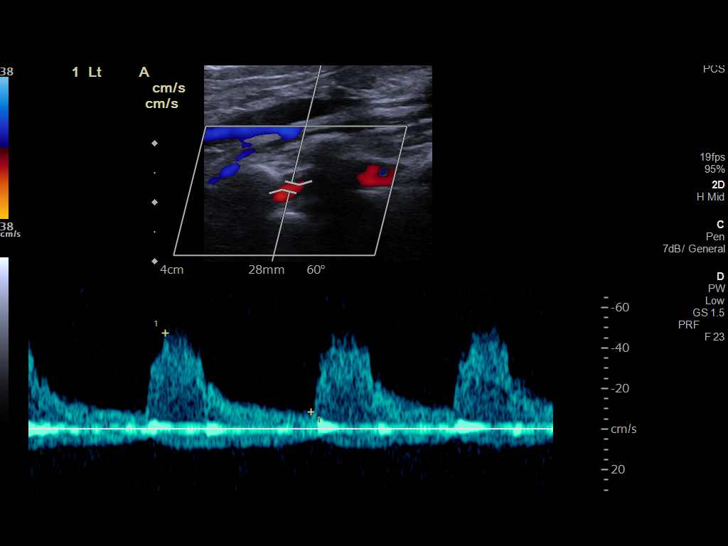

[13 of 24 positions shown; findings below may reference images not displayed]

FINDINGS: Criteria: Quantification of carotid stenosis is based on velocity
parameters that correlate the residual internal carotid diameter
with NASCET-based stenosis levels, using the diameter of the distal
internal carotid lumen as the denominator for stenosis measurement.

The following velocity measurements were obtained:

RIGHT

ICA:  193 cm/sec

CCA:  52 cm/sec

SYSTOLIC ICA/CCA RATIO:

DIASTOLIC ICA/CCA RATIO:

ECA:  194 cm/sec

LEFT

ICA:  73 cm/sec

CCA:  61 cm/sec

SYSTOLIC ICA/CCA RATIO:

DIASTOLIC ICA/CCA RATIO:

ECA:  115 cm/sec

RIGHT CAROTID ARTERY: Advanced calcified plaque in the right bulb.
Low resistance internal carotid Doppler pattern is preserved.

RIGHT VERTEBRAL ARTERY:  Antegrade.

LEFT CAROTID ARTERY: There is moderate calcified plaque in the upper
common carotid. There is prominent calcified plaque in the bulb. Low
resistance internal carotid Doppler pattern is preserved.

LEFT VERTEBRAL ARTERY:  Antegrade.
IMPRESSION: 50-69% stenosis in the right internal carotid artery.

Less than 50% stenosis in the left internal carotid artery.

## 2019-08-05 ENCOUNTER — Telehealth: Payer: Self-pay | Admitting: Family Medicine

## 2019-08-05 NOTE — Telephone Encounter (Signed)
Pt is calling to report that her uti is not any better.  She started antibiotic last week and it is not helping.  She thinks she started amoxicillin and pt finished ut yesterday.  Pt is having pain with urination, frequency cb is [phone] Sherian Rein is [facility] [address]

## 2019-08-05 NOTE — Telephone Encounter (Signed)
Left detailed voice message informing patient to come to office to drop off U/A sample for review. KW

## 2019-08-05 NOTE — Telephone Encounter (Signed)
Please advise if patient needs to drop off urine specimen since she was just in office? KW

## 2019-08-05 NOTE — Telephone Encounter (Signed)
She can drop off urine, if any bleeding or new symptoms she may need follow up. Advise to keep urology appointment as well scheduled 08/25/2019

## 2019-08-06 NOTE — Telephone Encounter (Signed)
[id number] 

## 2019-08-08 NOTE — Telephone Encounter (Signed)
Susan House I tried reaching out to this patient again today and received voicemail, she has an appointment with urologist I see in chart for 08/25/19. Just wanted to give you a heads up since I cant reach this patient. KW

## 2019-08-08 NOTE — Telephone Encounter (Signed)
Thank you for letting me know

## 2019-08-11 ENCOUNTER — Ambulatory Visit: Payer: Self-pay | Admitting: Family Medicine

## 2019-08-14 ENCOUNTER — Other Ambulatory Visit (INDEPENDENT_AMBULATORY_CARE_PROVIDER_SITE_OTHER): Payer: Medicare Other | Admitting: *Deleted

## 2019-08-14 ENCOUNTER — Telehealth: Payer: Self-pay

## 2019-08-14 DIAGNOSIS — N3001 Acute cystitis with hematuria: Secondary | ICD-10-CM

## 2019-08-14 DIAGNOSIS — R319 Hematuria, unspecified: Secondary | ICD-10-CM

## 2019-08-14 DIAGNOSIS — N390 Urinary tract infection, site not specified: Secondary | ICD-10-CM

## 2019-08-14 LAB — POCT URINALYSIS DIPSTICK
Appearance: ABNORMAL
Bilirubin, UA: NEGATIVE
Glucose, UA: POSITIVE — AB
Ketones, UA: NEGATIVE
Nitrite, UA: NEGATIVE
Odor: ABNORMAL
Protein, UA: POSITIVE — AB
Spec Grav, UA: 1.02 (ref 1.010–1.025)
Urobilinogen, UA: 0.2 E.U./dL
pH, UA: 5 (ref 5.0–8.0)

## 2019-08-14 NOTE — Telephone Encounter (Signed)
Patient was advised. Patient uses [facility] [address] [employer].

## 2019-08-14 NOTE — Telephone Encounter (Signed)
Tried calling pt back. No answer and no vm. 

## 2019-08-14 NOTE — Telephone Encounter (Signed)
Urine for C and S

## 2019-08-14 NOTE — Telephone Encounter (Signed)
Please advise 

## 2019-08-14 NOTE — Telephone Encounter (Signed)
Copied from [place] [id number]. Topic: General - Inquiry >> Aug 14, 2019 10:06 AM Percell Belt A wrote: Reason for CRM: pt called in stated that she thinks she as UTI and would like to know if her husband can bring over a sample to the office today?   Best number [phone]

## 2019-08-15 MED ORDER — NITROFURANTOIN MONOHYD MACRO 100 MG PO CAPS: 100.0000 mg | ORAL_CAPSULE | Freq: Two times a day (BID) | ORAL | 0 refills | Status: DC

## 2019-08-15 NOTE — Addendum Note (Signed)
Addended by: Julieta Bellini on: 08/15/2019 09:56 AM   Modules accepted: Orders

## 2019-08-15 NOTE — Telephone Encounter (Signed)
Per Dr. Rosanna Randy, rx for nitrofurantoin 100 mg bid x5 days was sent to pharmacy. Patient was advised.

## 2019-08-18 LAB — CULTURE, URINE COMPREHENSIVE

## 2019-08-19 ENCOUNTER — Other Ambulatory Visit: Payer: Self-pay | Admitting: Family Medicine

## 2019-08-20 ENCOUNTER — Ambulatory Visit: Payer: Self-pay | Admitting: Family Medicine

## 2019-08-20 ENCOUNTER — Other Ambulatory Visit (INDEPENDENT_AMBULATORY_CARE_PROVIDER_SITE_OTHER): Payer: Medicare Other | Admitting: *Deleted

## 2019-08-20 DIAGNOSIS — R300 Dysuria: Secondary | ICD-10-CM

## 2019-08-20 LAB — POCT URINALYSIS DIPSTICK
Appearance: ABNORMAL
Bilirubin, UA: NEGATIVE
Glucose, UA: POSITIVE — AB
Nitrite, UA: NEGATIVE
Odor: ABNORMAL
Protein, UA: POSITIVE — AB
Spec Grav, UA: 1.02 (ref 1.010–1.025)
Urobilinogen, UA: 0.2 E.U./dL
pH, UA: 6 (ref 5.0–8.0)

## 2019-08-20 NOTE — Telephone Encounter (Signed)
Patient called stating that she has burning pain and frequency when urinating.  She states that she has been treated for a UTI and completed her antibiotic yesterday. She is concerned that her symptoms still persist.  She states that she will be at the office today with her husband. He has scheduled appointment with Dr Rosanna Randy per patient. Call placed to office. Will route note to Dr Rosanna Randy per advice. Patient read care advice.  She verbalized understanding.  Reason for Disposition . Urinating more frequently than usual (i.e., frequency)  Answer Assessment - Initial Assessment Questions 1. SYMPTOM: "What's the main symptom you're concerned about?" (e.g., frequency, incontinence)     Pain and burning 2. ONSET: "When did the  pain  start?"     Original symptom 3. PAIN: "Is there any pain?" If so, ask: "How bad is it?" (Scale: 1-10; mild, moderate, severe)    6-7 4. CAUSE: "What do you think is causing the symptoms?"     UTI 5. OTHER SYMPTOMS: "Do you have any other symptoms?" (e.g., fever, flank pain, blood in urine, pain with urination)     Pain when urinating 6. PREGNANCY: "Is there any chance you are pregnant?" "When was your last menstrual period?"   N/A  Protocols used: URINARY [facility]

## 2019-08-20 NOTE — Telephone Encounter (Addendum)
Attempted to contact patient to discuss urinary symptoms. No answer. Unable to leave call back number.   Attempted to contact patient. No answer. Unable to leave call back number.  Attempted to contact patient. Gentleman answered and requested we call back. Patient is in the shower.

## 2019-08-22 ENCOUNTER — Other Ambulatory Visit: Payer: Self-pay | Admitting: *Deleted

## 2019-08-22 ENCOUNTER — Other Ambulatory Visit: Payer: Self-pay | Admitting: Family Medicine

## 2019-08-22 DIAGNOSIS — N183 Chronic kidney disease, stage 3 unspecified: Secondary | ICD-10-CM

## 2019-08-22 DIAGNOSIS — E0822 Diabetes mellitus due to underlying condition with diabetic chronic kidney disease: Secondary | ICD-10-CM

## 2019-08-22 DIAGNOSIS — N3001 Acute cystitis with hematuria: Secondary | ICD-10-CM

## 2019-08-22 MED ORDER — CIPROFLOXACIN HCL 250 MG PO TABS: 250.0000 mg | ORAL_TABLET | Freq: Two times a day (BID) | ORAL | 0 refills | Status: DC

## 2019-08-23 LAB — CULTURE, URINE COMPREHENSIVE

## 2019-08-23 LAB — SPECIMEN STATUS REPORT

## 2019-08-25 ENCOUNTER — Ambulatory Visit: Payer: Medicare Other | Admitting: Urology

## 2019-08-25 ENCOUNTER — Encounter: Payer: Self-pay | Admitting: Urology

## 2019-08-28 ENCOUNTER — Other Ambulatory Visit: Payer: Self-pay | Admitting: Family Medicine

## 2019-09-09 DIAGNOSIS — E1140 Type 2 diabetes mellitus with diabetic neuropathy, unspecified: Secondary | ICD-10-CM | POA: Diagnosis not present

## 2019-09-09 DIAGNOSIS — B351 Tinea unguium: Secondary | ICD-10-CM | POA: Diagnosis not present

## 2019-09-10 ENCOUNTER — Ambulatory Visit: Payer: Self-pay | Admitting: Family Medicine

## 2019-09-10 NOTE — Telephone Encounter (Signed)
Pt reports fell this am transferring from bed to [facility]. States little toe of right foot, "Might be broken." Reports painful and swollen, no deformity. Pt has taped to next toe. Also reports bruising of both knees, no lacerations. States left 4th and 5th toe painful from fall 2 weeks ago. Pt lives with husband. Denies any lacerations, no open wounds. Can ambulate with rolling walker (baseline) MAE x 4.  States has taken tylenol #3 for pain which has been "A little" effective. Appt made for tomorrow per disposition. Advised ED/UC if symptoms worsen. Care advise given per protocol. Pt verbalizes understanding.  Reason for Disposition . [1] Toe injury AND [2] bad limp or can't wear shoes/sandals  Answer Assessment - Initial Assessment Questions 1. MECHANISM: "How did the injury happen?"      Fell getting from bed to [facility] 2. ONSET: "When did the injury happen?" (Minutes or hours ago)      This am 3. LOCATION: "What part of the toe is injured?" "Is the nail damaged?"      Right small toe, left 4-5th toe 4. APPEARANCE of TOE INJURY: "What does the injury look like?"      swollen 5. SEVERITY: "Can you use the foot normally?" "Can you walk?"      Walking with walker 6. SIZE: For cuts, bruises, or swelling, ask: "How large is it?" (e.g., inches or centimeters;  entire toe)      Bruising of knees 7. PAIN: "Is there pain?" If so, ask: "How bad is the pain?"   (e.g., Scale 1-10; or mild, moderate, severe)     7/10 toe, knees 7/10 8. TETANUS: For any breaks in the skin, ask: "When was the last tetanus booster?"     NA 9. DIABETES: "Do you have a history of diabetes or poor circulation in the feet?"     DM 10. OTHER SYMPTOMS: "Do you have any other symptoms?"        NO  Protocols used: TOE INJURY-A-AH

## 2019-09-11 ENCOUNTER — Ambulatory Visit
Admission: RE | Admit: 2019-09-11 | Discharge: 2019-09-11 | Disposition: A | Discharge status: Home / Self Care | Payer: Medicare Other | Source: Ambulatory Visit | Attending: Physician Assistant | Admitting: Physician Assistant

## 2019-09-11 ENCOUNTER — Encounter: Payer: Self-pay | Admitting: Physician Assistant

## 2019-09-11 ENCOUNTER — Ambulatory Visit
Admission: RE | Admit: 2019-09-11 | Discharge: 2019-09-11 | Disposition: A | Discharge status: Home / Self Care | Payer: Medicare Other | Attending: Physician Assistant | Admitting: Physician Assistant

## 2019-09-11 ENCOUNTER — Ambulatory Visit (INDEPENDENT_AMBULATORY_CARE_PROVIDER_SITE_OTHER): Payer: Medicare Other | Admitting: Physician Assistant

## 2019-09-11 ENCOUNTER — Other Ambulatory Visit: Payer: Self-pay

## 2019-09-11 VITALS — BP 152/72 | HR 72 | Temp 95.9°F

## 2019-09-11 DIAGNOSIS — W19XXXA Unspecified fall, initial encounter: Secondary | ICD-10-CM | POA: Diagnosis not present

## 2019-09-11 DIAGNOSIS — M25561 Pain in right knee: Secondary | ICD-10-CM

## 2019-09-11 DIAGNOSIS — M79671 Pain in right foot: Secondary | ICD-10-CM

## 2019-09-11 DIAGNOSIS — S92514A Nondisplaced fracture of proximal phalanx of right lesser toe(s), initial encounter for closed fracture: Secondary | ICD-10-CM | POA: Diagnosis not present

## 2019-09-11 NOTE — Progress Notes (Signed)
Patient: Susan House Female    DOB: [birth year]   64 y.o.   MRN: [id number] Visit Date: 09/11/2019  Today's Provider: Trinna Post, PA-C   Chief Complaint  Patient presents with  . Toe Pain   Subjective:     Toe Pain  The incident occurred 12 to 24 hours ago. The incident occurred at home. The injury mechanism was a fall. The pain is present in the right toes and right leg (pinky toe). The quality of the pain is described as stabbing. The pain is at a severity of 8/10. She has tried ice and heat for the symptoms.   Patient thinks that she may have fractured her toe. Patient's knees are also bruised. Patient rolled over in bed and fell out from a height of three feet onto hard wood floor. She fell yesterday. She reports hitting her toes on the bedside commode. No bleeding or bruising. Did not hit her head or lose consciousness. No able to walk or stand well recently. This is her third fall in three weeks.   Allergies  Allergen Reactions  . Morphine And Related Anaphylaxis  . Fentanyl Other (See Comments)    Unknown reaction.  . Reglan [Metoclopramide]     Other reaction(s): Unknown Elevated BP  . Simvastatin Other (See Comments)    Muscle pain  . Betadine [Povidone Iodine] Rash  . Tetracyclines & Related Rash    Other reaction(s): Unknown     Current Outpatient Medications:  .  ACCU-CHEK AVIVA PLUS test strip, TEST BLOOD SUGAR THREE  TIMES DAILY, Disp: 300 strip, Rfl: 2 .  ACCU-CHEK SOFTCLIX LANCETS lancets, CHECK BLOOD SUGAR THREE TIMES DAILY, Disp: 300 each, Rfl: 3 .  amLODipine (NORVASC) 10 MG tablet, TAKE 1 TABLET BY MOUTH  DAILY, Disp: 90 tablet, Rfl: 3 .  amoxicillin-clavulanate (AUGMENTIN) 875-125 MG tablet, Take 1 tablet by mouth 2 (two) times daily., Disp: 14 tablet, Rfl: 0 .  aspirin 81 MG chewable tablet, Chew 1 tablet (81 mg total) by mouth daily., Disp: , Rfl:  .  atorvastatin (LIPITOR) 20 MG tablet, TAKE 1 TABLET BY MOUTH  DAILY, Disp: 90  tablet, Rfl: 3 .  Blood Glucose Monitoring Suppl (ACCU-CHEK AVIVA PLUS) w/Device KIT, Check sugar three times daily DX E11.9-needs a meter, Disp: 1 kit, Rfl: 0 .  cloNIDine (CATAPRES) 0.3 MG tablet, Take 1 tablet (0.3 mg total) by mouth 2 (two) times daily., Disp: 180 tablet, Rfl: 3 .  clopidogrel (PLAVIX) 75 MG tablet, TAKE 1 TABLET BY MOUTH  DAILY, Disp: 90 tablet, Rfl: 1 .  enalapril (VASOTEC) 20 MG tablet, Take 1 tablet (20 mg total) by mouth 2 (two) times daily., Disp: 180 tablet, Rfl: 3 .  estradiol (ESTRACE) 0.1 MG/GM vaginal cream, APPLY A SMALL AMOUNT TOPICALLY TO URETHRA AREA AT BEDTIME DAILY FOR 1 WEEK THEN THREE TIMES A WEEK FOR 3 WEEKS, Disp: , Rfl:  .  gabapentin (NEURONTIN) 100 MG capsule, Take 200 mg by mouth 3 (three) times daily., Disp: , Rfl:  .  hydrALAZINE (APRESOLINE) 100 MG tablet, Take 1 tablet (100 mg total) by mouth 2 (two) times daily., Disp: 180 tablet, Rfl: 2 .  HYDROcodone-acetaminophen (NORCO) 10-325 MG tablet, Take 1 tablet by mouth every 6 (six) hours as needed., Disp: 100 tablet, Rfl: 0 .  Lancet Devices (ACCU-CHEK SOFTCLIX) lancets, Check sugar three times daily DX E11.9, Disp: 100 each, Rfl: 3 .  levETIRAcetam (KEPPRA) 500 MG tablet, Take 1 tablet (500  mg total) by mouth 2 (two) times daily. 500 mg bid., Disp: 180 tablet, Rfl: 3 .  metFORMIN (GLUCOPHAGE) 1000 MG tablet, Take 1 tablet (1,000 mg total) by mouth 2 (two) times daily., Disp: 180 tablet, Rfl: 1 .  metoprolol succinate (TOPROL-XL) 100 MG 24 hr tablet, Take 1 tablet (100 mg total) by mouth daily., Disp: 90 tablet, Rfl: 3 .  nitrofurantoin, macrocrystal-monohydrate, (MACROBID) 100 MG capsule, Take 1 capsule (100 mg total) by mouth 2 (two) times daily., Disp: 10 capsule, Rfl: 0 .  nitroGLYCERIN (NITROSTAT) 0.4 MG SL tablet, Place 1 tablet (0.4 mg total) under the tongue every 5 (five) minutes as needed for chest pain., Disp: 25 tablet, Rfl: 0 .  ondansetron (ZOFRAN) 4 MG tablet, Take 1 tablet (4 mg total) by  mouth every 8 (eight) hours as needed for nausea or vomiting., Disp: 60 tablet, Rfl: 5 .  oxybutynin (DITROPAN) 5 MG tablet, Take 1 tablet (5 mg total) by mouth 2 (two) times daily., Disp: 180 tablet, Rfl: 3 .  pantoprazole (PROTONIX) 40 MG tablet, TAKE 1 TABLET BY MOUTH  DAILY, Disp: 90 tablet, Rfl: 3 .  traMADol-acetaminophen (ULTRACET) 37.5-325 MG tablet, Take 1 tablet by mouth every 6 (six) hours as needed., Disp: 30 tablet, Rfl: 0 .  ciprofloxacin (CIPRO) 250 MG tablet, Take 1 tablet (250 mg total) by mouth 2 (two) times daily. (Patient not taking: Reported on 09/11/2019), Disp: 10 tablet, Rfl: 0 .  Continuous Blood Gluc Receiver (FREESTYLE LIBRE 14 DAY READER) DEVI, LENGTH OF NEED: LIFETIME - UNLESS SPECIFIED OTHERWISE (Patient not taking: Reported on 09/11/2019), Disp: 2 Device, Rfl: 11 .  Continuous Blood Gluc Sensor (FREESTYLE LIBRE 14 DAY SENSOR) MISC, LENGTH OF NEED: LIFETIME - UNLESS SPECIFIED OTHERWISE (Patient not taking: Reported on 09/11/2019), Disp: 2 each, Rfl: 0 .  insulin degludec (TRESIBA FLEXTOUCH) 100 UNIT/ML SOPN FlexTouch Pen, INJECT UP TO 30 UNITS UNDER THE SKIN DAILY (Patient not taking: Reported on 09/11/2019), Disp: 9 mL, Rfl: 0  Review of Systems  Social History   Tobacco Use  . Smoking status: Never Smoker  . Smokeless tobacco: Never Used  Substance Use Topics  . Alcohol use: No      Objective:   BP (!) 152/72 (BP Location: Right Arm, Patient Position: Sitting, Cuff Size: Large)   Pulse 72   Temp (!) 95.9 F (35.5 C) (Temporal)  Vitals:   09/11/19 0825  BP: (!) 152/72  Pulse: 72  Temp: (!) 95.9 F (35.5 C)  TempSrc: Temporal  There is no height or weight on file to calculate BMI.   Physical Exam Constitutional:      Appearance: Normal appearance.  Musculoskeletal:     Right knee: Tenderness present over the lateral joint line.     Left knee: No tenderness.     Right foot: Swelling and tenderness present.     Left foot: Swelling present. No  tenderness.       Legs:  Neurological:     Mental Status: She is alert. Mental status is at baseline.  Psychiatric:        Mood and Affect: Mood normal.        Behavior: Behavior normal.      No results found for any visits on 09/11/19.     Assessment & Plan    1. Fall, initial encounter  Offered PT to help improve strength and balance but patient declines.  2. Right foot pain  - DG Foot Complete Right; Future  3. Acute pain  of right knee  - DG Knee Complete 4 Views Right; Future  The entirety of the information documented in the History of Present Illness, Review of Systems and Physical Exam were personally obtained by me. Portions of this information were initially documented by Elonda Husky, CMA and reviewed by me for thoroughness and accuracy.      Trinna Post, PA-C  [facility]

## 2019-09-11 NOTE — Patient Instructions (Signed)

## 2019-09-12 ENCOUNTER — Telehealth: Payer: Self-pay

## 2019-09-12 NOTE — Telephone Encounter (Signed)
Called to advise patient of xray results, no answer. Will try again later.

## 2019-09-12 NOTE — Telephone Encounter (Signed)
-----   Message from Trinna Post, [place] sent at 09/12/2019  9:08 AM EST ----- Please call patient and let her know that her knee xray shows arthritis but no fracture. Her foot xray shows she broke her 4th toe and has an old healing fracture of her fifth toe. I would recommend she not bear weight on her right foot until she get evaluated by podiatry. May need walking boot or some other type of immobilization. If she is OK with referral, I will place it with podiatry.

## 2019-09-12 NOTE — Telephone Encounter (Signed)
Yes she can schedule with Dr. Cleda Mccreedy. Her knee was not broken on the xray. If she has having continued pain I recommend she see the [facility] in hours from 1:30 - 7:30 PM.

## 2019-09-12 NOTE — Telephone Encounter (Signed)
Patient was advised and states she just seen her podiatrists on 09/09/2019. Patient would like to know if you want her to call and schedule another appointment with Dr.Cline? Patient reports her right knee is been very painful and she could not get up out of bed this morning. Please advise.

## 2019-09-12 NOTE — Telephone Encounter (Signed)
Patient was advised and states that she will call to schedule appointment with Dr.Cline.

## 2019-09-12 NOTE — Telephone Encounter (Signed)
Patient was advised via another message. 

## 2019-09-13 ENCOUNTER — Other Ambulatory Visit: Payer: Self-pay | Admitting: Family Medicine

## 2019-09-13 DIAGNOSIS — G40909 Epilepsy, unspecified, not intractable, without status epilepticus: Secondary | ICD-10-CM

## 2019-09-13 DIAGNOSIS — I10 Essential (primary) hypertension: Secondary | ICD-10-CM

## 2019-09-13 DIAGNOSIS — N183 Chronic kidney disease, stage 3 unspecified: Secondary | ICD-10-CM

## 2019-09-13 DIAGNOSIS — E0822 Diabetes mellitus due to underlying condition with diabetic chronic kidney disease: Secondary | ICD-10-CM

## 2019-09-13 NOTE — Telephone Encounter (Signed)
Requested Prescriptions  Pending Prescriptions Disp Refills  . levETIRAcetam (KEPPRA) 500 MG tablet [Pharmacy Med Name: LEVETIRACETAM  500MG   TAB] 180 tablet     Sig: TAKE 1 TABLET BY MOUTH 2  TIMES DAILY     Not Delegated - Neurology:  Anticonvulsants Failed - 09/13/2019  7:10 AM      Failed - This refill cannot be delegated      Failed - HCT in normal range and within 360 days    HCT  Date Value Ref Range Status  07/19/2019 32.3 (L) 36.0 - 46.0 % Final   Hematocrit  Date Value Ref Range Status  01/23/2019 35.1 34.0 - 46.6 % Final         Failed - HGB in normal range and within 360 days    Hemoglobin  Date Value Ref Range Status  07/19/2019 10.9 (L) 12.0 - 15.0 g/dL Final  01/23/2019 11.2 11.1 - 15.9 g/dL Final         Passed - PLT in normal range and within 360 days    Platelets  Date Value Ref Range Status  07/19/2019 243 150 - 400 K/uL Final  01/23/2019 223 150 - 450 x10E3/uL Final         Passed - WBC in normal range and within 360 days    WBC  Date Value Ref Range Status  07/19/2019 7.3 4.0 - 10.5 K/uL Final         Passed - Valid encounter within last 12 months    Recent Outpatient Visits          2 days ago Fall, initial encounter   [facility], Adriana M, PA-C   1 month ago Urinary tract infection with hematuria, site unspecified   [facility], Kelby Aline, FNP   3 months ago Hematoma and contusion   [facility], [place], [place]   3 months ago Hematoma and contusion   [facility], [place] E, [place]   3 months ago Cellulitis of right lower extremity   [facility] Jerrol Banana., MD             . metoprolol succinate (TOPROL-XL) 100 MG 24 hr tablet [Pharmacy Med Name: METOPROLOL SUCC ER 100MG  TABLET] 90 tablet 3    Sig: TAKE 1 TABLET BY MOUTH  DAILY     Cardiovascular:  Beta Blockers Failed - 09/13/2019  7:10 AM      Failed - Last BP in normal  range    BP Readings from Last 1 Encounters:  09/11/19 (!) 152/72         Passed - Last Heart Rate in normal range    Pulse Readings from Last 1 Encounters:  09/11/19 72         Passed - Valid encounter within last 6 months    Recent Outpatient Visits          2 days ago Fall, initial encounter   [facility], Adriana M, PA-C   1 month ago Urinary tract infection with hematuria, site unspecified   [facility], Kelby Aline, FNP   3 months ago Hematoma and contusion   [facility], [place] E, [place]   3 months ago Hematoma and contusion   [facility], [place], [place]   3 months ago Cellulitis of right lower extremity   [facility] Jerrol Banana., MD

## 2019-09-13 NOTE — Telephone Encounter (Signed)
Requested medication (s) are due for refill today: yes  Requested medication (s) are on the active medication list: yes  Last refill:  10/30/18  Future visit scheduled: no  Notes to clinic:  medication not delegated to NT to refill   Requested Prescriptions  Pending Prescriptions Disp Refills   levETIRAcetam (KEPPRA) 500 MG tablet [Pharmacy Med Name: LEVETIRACETAM  500MG   TAB] 180 tablet     Sig: TAKE 1 TABLET BY MOUTH 2  TIMES DAILY      Not Delegated - Neurology:  Anticonvulsants Failed - 09/13/2019  7:10 AM      Failed - This refill cannot be delegated      Failed - HCT in normal range and within 360 days    HCT  Date Value Ref Range Status  07/19/2019 32.3 (L) 36.0 - 46.0 % Final   Hematocrit  Date Value Ref Range Status  01/23/2019 35.1 34.0 - 46.6 % Final          Failed - HGB in normal range and within 360 days    Hemoglobin  Date Value Ref Range Status  07/19/2019 10.9 (L) 12.0 - 15.0 g/dL Final  01/23/2019 11.2 11.1 - 15.9 g/dL Final          Passed - PLT in normal range and within 360 days    Platelets  Date Value Ref Range Status  07/19/2019 243 150 - 400 K/uL Final  01/23/2019 223 150 - 450 x10E3/uL Final          Passed - WBC in normal range and within 360 days    WBC  Date Value Ref Range Status  07/19/2019 7.3 4.0 - 10.5 K/uL Final          Passed - Valid encounter within last 12 months    Recent Outpatient Visits           2 days ago Fall, initial encounter   [facility], Adriana M, PA-C   1 month ago Urinary tract infection with hematuria, site unspecified   [facility], Kelby Aline, FNP   3 months ago Hematoma and contusion   [facility], [place] E, [place]   3 months ago Hematoma and contusion   [facility], [place] E, [place]   3 months ago Cellulitis of right lower extremity   [facility] Jerrol Banana., MD                Signed Prescriptions Disp Refills   metoprolol succinate (TOPROL-XL) 100 MG 24 hr tablet 90 tablet 3    Sig: TAKE 1 TABLET BY MOUTH  DAILY      Cardiovascular:  Beta Blockers Failed - 09/13/2019  7:10 AM      Failed - Last BP in normal range    BP Readings from Last 1 Encounters:  09/11/19 (!) 152/72          Passed - Last Heart Rate in normal range    Pulse Readings from Last 1 Encounters:  09/11/19 72          Passed - Valid encounter within last 6 months    Recent Outpatient Visits           2 days ago Fall, initial encounter   [facility], Adriana M, PA-C   1 month ago Urinary tract infection with hematuria, site unspecified   [place], Kelby Aline, FNP   3 months ago Hematoma  and contusion   [place], [place]   3 months ago Hematoma and contusion   [place], [place]   3 months ago Cellulitis of right lower extremity   [facility] Jerrol Banana., MD

## 2019-09-14 NOTE — Telephone Encounter (Signed)
Requested Prescriptions  Pending Prescriptions Disp Refills  . metFORMIN (GLUCOPHAGE) 1000 MG tablet [Pharmacy Med Name: MetFORMIN 1000MG TABLET] 180 tablet 1    Sig: TAKE 1 TABLET BY MOUTH TWO  TIMES DAILY     Endocrinology:  Diabetes - Biguanides Failed - 09/13/2019 11:27 AM      Failed - Cr in normal range and within 360 days    Creatinine  Date Value Ref Range Status  06/17/2014 1.29 0.60 - 1.30 mg/dL Final   Creatinine, Ser  Date Value Ref Range Status  07/19/2019 1.34 (H) 0.44 - 1.00 mg/dL Final         Failed - HBA1C is between 0 and 7.9 and within 180 days    Hemoglobin A1C  Date Value Ref Range Status  05/28/2019 9.4 (A) 4.0 - 5.6 % Final  01/17/2014 9.6 (H) 4.2 - 6.3 % Final    Comment:    The American Diabetes Association recommends that a primary goal of therapy should be <7% and that physicians should reevaluate the treatment regimen in patients with HbA1c values consistently >8%.    Hgb A1c MFr Bld  Date Value Ref Range Status  01/23/2019 9.2 (H) 4.8 - 5.6 % Final    Comment:             Prediabetes: 5.7 - 6.4          Diabetes: >6.4          Glycemic control for adults with diabetes: <7.0          Failed - eGFR in normal range and within 360 days    EGFR (African American)  Date Value Ref Range Status  06/17/2014 55 (L) >22m/min Final  01/18/2014 55 (L)  Final   GFR calc Af Amer  Date Value Ref Range Status  07/19/2019 49 (L) >60 mL/min Final   EGFR (Non-African Amer.)  Date Value Ref Range Status  06/17/2014 45 (L) >686mmin Final    Comment:    eGFR values <6045min/1.73 m2 may be an indication of chronic kidney disease (CKD). Calculated eGFR, using the MRDR Study equation, is useful in  patients with stable renal function. The eGFR calculation will not be reliable in acutely ill patients when serum creatinine is changing rapidly. It is not useful in patients on dialysis. The eGFR calculation may not be applicable to patients at the low and  high extremes of body sizes, pregnant women, and vegetarians.   01/18/2014 47 (L)  Final    Comment:    eGFR values <38m39mn/1.73 m2 may be an indication of chronic kidney disease (CKD). Calculated eGFR is useful in patients with stable renal function. The eGFR calculation will not be reliable in acutely ill patients when serum creatinine is changing rapidly. It is not useful in  patients on dialysis. The eGFR calculation may not be applicable to patients at the low and high extremes of body sizes, pregnant women, and vegetarians.    GFR calc non Af Amer  Date Value Ref Range Status  07/19/2019 42 (L) >60 mL/min Final         Passed - Valid encounter within last 6 months    Recent Outpatient Visits          3 days ago Fall, initial encounter   [facility] [facility], [facility]   1 month ago Urinary tract infection with hematuria, site unspecified   [place]   3 months ago Hematoma and contusion  [place], [place]   3 months ago Hematoma and contusion   [place], [place]   3 months ago Cellulitis of right lower extremity   [facility] Jerrol Banana., MD

## 2019-09-17 ENCOUNTER — Other Ambulatory Visit: Payer: Self-pay | Admitting: Family Medicine

## 2019-09-17 DIAGNOSIS — S92514D Nondisplaced fracture of proximal phalanx of right lesser toe(s), subsequent encounter for fracture with routine healing: Secondary | ICD-10-CM | POA: Diagnosis not present

## 2019-09-17 DIAGNOSIS — E0822 Diabetes mellitus due to underlying condition with diabetic chronic kidney disease: Secondary | ICD-10-CM

## 2019-09-17 DIAGNOSIS — N183 Chronic kidney disease, stage 3 unspecified: Secondary | ICD-10-CM

## 2019-10-03 ENCOUNTER — Ambulatory Visit (INDEPENDENT_AMBULATORY_CARE_PROVIDER_SITE_OTHER): Payer: Medicare Other | Admitting: Physician Assistant

## 2019-10-03 ENCOUNTER — Encounter: Payer: Self-pay | Admitting: Physician Assistant

## 2019-10-03 ENCOUNTER — Other Ambulatory Visit: Payer: Self-pay

## 2019-10-03 VITALS — BP 138/82 | HR 80 | Temp 96.5°F

## 2019-10-03 DIAGNOSIS — N3090 Cystitis, unspecified without hematuria: Secondary | ICD-10-CM | POA: Diagnosis not present

## 2019-10-03 MED ORDER — CIPROFLOXACIN HCL 250 MG PO TABS: 250.0000 mg | ORAL_TABLET | Freq: Two times a day (BID) | ORAL | 0 refills | Status: DC

## 2019-10-03 NOTE — Patient Instructions (Signed)

## 2019-10-03 NOTE — Progress Notes (Signed)
Patient: Susan House Female    DOB: [birth year]   64 y.o.   MRN: [id number] Visit Date: 10/03/2019  Today's Provider: Trinna Post, PA-C   Chief Complaint  Patient presents with  . Urinary Frequency   Subjective:     Urinary Frequency  This is a recurrent problem. The current episode started 1 to 4 weeks ago. The problem occurs every urination. The problem has been unchanged. The quality of the pain is described as aching and burning. The pain is at a severity of 6/10. The pain is moderate. There has been no fever. The fever has been present for less than 1 day. Associated symptoms include frequency. Pertinent negatives include no discharge or urgency. Her past medical history is significant for recurrent UTIs.    Allergies  Allergen Reactions  . Morphine And Related Anaphylaxis  . Fentanyl Other (See Comments)    Unknown reaction.  . Reglan [Metoclopramide]     Other reaction(s): Unknown Elevated BP  . Simvastatin Other (See Comments)    Muscle pain  . Betadine [Povidone Iodine] Rash  . Tetracyclines & Related Rash    Other reaction(s): Unknown     Current Outpatient Medications:  .  ACCU-CHEK AVIVA PLUS test strip, TEST BLOOD SUGAR THREE  TIMES DAILY, Disp: 300 strip, Rfl: 2 .  ACCU-CHEK SOFTCLIX LANCETS lancets, CHECK BLOOD SUGAR THREE TIMES DAILY, Disp: 300 each, Rfl: 3 .  amLODipine (NORVASC) 10 MG tablet, TAKE 1 TABLET BY MOUTH  DAILY, Disp: 90 tablet, Rfl: 3 .  aspirin 81 MG chewable tablet, Chew 1 tablet (81 mg total) by mouth daily., Disp: , Rfl:  .  atorvastatin (LIPITOR) 20 MG tablet, TAKE 1 TABLET BY MOUTH  DAILY, Disp: 90 tablet, Rfl: 3 .  Blood Glucose Monitoring Suppl (ACCU-CHEK AVIVA PLUS) w/Device KIT, Check sugar three times daily DX E11.9-needs a meter, Disp: 1 kit, Rfl: 0 .  cloNIDine (CATAPRES) 0.3 MG tablet, Take 1 tablet (0.3 mg total) by mouth 2 (two) times daily., Disp: 180 tablet, Rfl: 3 .  clopidogrel (PLAVIX) 75 MG tablet, TAKE  1 TABLET BY MOUTH  DAILY, Disp: 90 tablet, Rfl: 1 .  Continuous Blood Gluc Receiver (FREESTYLE LIBRE 14 DAY READER) DEVI, LENGTH OF NEED: LIFETIME - UNLESS SPECIFIED OTHERWISE, Disp: 2 Device, Rfl: 11 .  Continuous Blood Gluc Sensor (FREESTYLE LIBRE 14 DAY SENSOR) MISC, LENGTH OF NEED: LIFETIME - UNLESS SPECIFIED OTHERWISE, Disp: 2 each, Rfl: 0 .  enalapril (VASOTEC) 20 MG tablet, Take 1 tablet (20 mg total) by mouth 2 (two) times daily., Disp: 180 tablet, Rfl: 3 .  estradiol (ESTRACE) 0.1 MG/GM vaginal cream, APPLY A SMALL AMOUNT TOPICALLY TO URETHRA AREA AT BEDTIME DAILY FOR 1 WEEK THEN THREE TIMES A WEEK FOR 3 WEEKS, Disp: , Rfl:  .  gabapentin (NEURONTIN) 100 MG capsule, Take 200 mg by mouth 3 (three) times daily., Disp: , Rfl:  .  HYDROcodone-acetaminophen (NORCO) 10-325 MG tablet, Take 1 tablet by mouth every 6 (six) hours as needed., Disp: 100 tablet, Rfl: 0 .  insulin degludec (TRESIBA FLEXTOUCH) 100 UNIT/ML SOPN FlexTouch Pen, ADMINISTER UP TO 30 UNITS UNDER THE SKIN DAILY, Disp: 9 mL, Rfl: 0 .  Lancet Devices (ACCU-CHEK SOFTCLIX) lancets, Check sugar three times daily DX E11.9, Disp: 100 each, Rfl: 3 .  levETIRAcetam (KEPPRA) 500 MG tablet, TAKE 1 TABLET BY MOUTH 2  TIMES DAILY, Disp: 180 tablet, Rfl: 3 .  metFORMIN (GLUCOPHAGE) 1000 MG tablet, TAKE 1  TABLET BY MOUTH TWO  TIMES DAILY, Disp: 180 tablet, Rfl: 1 .  metoprolol succinate (TOPROL-XL) 100 MG 24 hr tablet, TAKE 1 TABLET BY MOUTH  DAILY, Disp: 90 tablet, Rfl: 3 .  nitrofurantoin, macrocrystal-monohydrate, (MACROBID) 100 MG capsule, Take 1 capsule (100 mg total) by mouth 2 (two) times daily., Disp: 10 capsule, Rfl: 0 .  nitroGLYCERIN (NITROSTAT) 0.4 MG SL tablet, Place 1 tablet (0.4 mg total) under the tongue every 5 (five) minutes as needed for chest pain., Disp: 25 tablet, Rfl: 0 .  ondansetron (ZOFRAN) 4 MG tablet, Take 1 tablet (4 mg total) by mouth every 8 (eight) hours as needed for nausea or vomiting., Disp: 60 tablet, Rfl: 5 .   oxybutynin (DITROPAN) 5 MG tablet, Take 1 tablet (5 mg total) by mouth 2 (two) times daily., Disp: 180 tablet, Rfl: 3 .  pantoprazole (PROTONIX) 40 MG tablet, TAKE 1 TABLET BY MOUTH  DAILY, Disp: 90 tablet, Rfl: 3 .  traMADol-acetaminophen (ULTRACET) 37.5-325 MG tablet, Take 1 tablet by mouth every 6 (six) hours as needed., Disp: 30 tablet, Rfl: 0 .  amoxicillin-clavulanate (AUGMENTIN) 875-125 MG tablet, Take 1 tablet by mouth 2 (two) times daily. (Patient not taking: Reported on 10/03/2019), Disp: 14 tablet, Rfl: 0 .  ciprofloxacin (CIPRO) 250 MG tablet, Take 1 tablet (250 mg total) by mouth 2 (two) times daily. (Patient not taking: Reported on 09/11/2019), Disp: 10 tablet, Rfl: 0 .  hydrALAZINE (APRESOLINE) 100 MG tablet, Take 1 tablet (100 mg total) by mouth 2 (two) times daily., Disp: 180 tablet, Rfl: 2  Review of Systems  Constitutional: Negative.   Respiratory: Negative.   Genitourinary: Positive for frequency. Negative for urgency.    Social History   Tobacco Use  . Smoking status: Never Smoker  . Smokeless tobacco: Never Used  Substance Use Topics  . Alcohol use: No      Objective:   BP 138/82 (BP Location: Right Arm, Patient Position: Sitting, Cuff Size: Normal)   Pulse 80   Temp (!) 96.5 F (35.8 C) (Temporal)   SpO2 95%  Vitals:   10/03/19 1117  BP: 138/82  Pulse: 80  Temp: (!) 96.5 F (35.8 C)  TempSrc: Temporal  SpO2: 95%  There is no height or weight on file to calculate BMI.   Physical Exam Constitutional:      General: She is not in acute distress.    Appearance: She is well-developed. She is not diaphoretic.  Cardiovascular:     Rate and Rhythm: Normal rate and regular rhythm.  Pulmonary:     Effort: Pulmonary effort is normal.     Breath sounds: Normal breath sounds.  Abdominal:     General: Bowel sounds are normal. There is no distension.     Palpations: Abdomen is soft.     Tenderness: There is abdominal tenderness in the suprapubic area. There  is no guarding or rebound.  Skin:    General: Skin is warm and dry.  Neurological:     Mental Status: She is alert and oriented to person, place, and time.  Psychiatric:        Behavior: Behavior normal.      No results found for any visits on 10/03/19.     Assessment & Plan    1. Recurrent cystitis  Acute on chronic flare. Unable to give sample. Treat empirically based on last sample which was multi drug resistant.  2. Cystitis  - ciprofloxacin (CIPRO) 250 MG tablet; Take 1 tablet (250 mg total)  by mouth 2 (two) times daily.  Dispense: 6 tablet; Refill: 0  The entirety of the information documented in the History of Present Illness, Review of Systems and Physical Exam were personally obtained by me. Portions of this information were initially documented by [facility] and reviewed by me for thoroughness and accuracy.      Trinna Post, PA-C  [facility]

## 2019-10-09 DIAGNOSIS — S92514D Nondisplaced fracture of proximal phalanx of right lesser toe(s), subsequent encounter for fracture with routine healing: Secondary | ICD-10-CM | POA: Diagnosis not present

## 2019-10-16 ENCOUNTER — Ambulatory Visit (INDEPENDENT_AMBULATORY_CARE_PROVIDER_SITE_OTHER): Payer: Medicare Other | Admitting: Physician Assistant

## 2019-10-16 DIAGNOSIS — R519 Headache, unspecified: Secondary | ICD-10-CM | POA: Diagnosis not present

## 2019-10-16 DIAGNOSIS — I63512 Cerebral infarction due to unspecified occlusion or stenosis of left middle cerebral artery: Secondary | ICD-10-CM

## 2019-10-16 NOTE — Progress Notes (Signed)
Patient: Susan House Female    DOB: 1956-03-28   64 y.o.   MRN: [id number] Visit Date: 10/16/2019  Today's Provider: Trinna Post, PA-C   Chief Complaint  Patient presents with  . Headache   Subjective:    Virtual Visit via Telephone Note  I connected with Susan House on 10/16/19 at  8:00 AM EDT by telephone and verified that I am speaking with the correct person using two identifiers.  Location: Patient: Home Provider: Office   I discussed the limitations, risks, security and privacy concerns of performing an evaluation and management service by telephone and the availability of in person appointments. I also discussed with the patient that there may be a patient responsible charge related to this service. The patient expressed understanding and agreed to proceed.   Headache  This is a chronic problem. The current episode started in the past 7 days. Pertinent negatives include no dizziness or nausea. Nothing aggravates the symptoms. She has tried acetaminophen for the symptoms. The treatment provided mild relief.   Patient with history of CVA, HTN, HLD reports headaches ongoing yesterday and today. She reports she feels much better this morning. Denies visual changes, weakness and imbalance above baseline. She denies vaccination for COVID. She denies SOB or coughing. Patient unable to get vitals this morning, not out of bed yet.   Allergies  Allergen Reactions  . Morphine And Related Anaphylaxis  . Fentanyl Other (See Comments)    Unknown reaction.  . Reglan [Metoclopramide]     Other reaction(s): Unknown Elevated BP  . Simvastatin Other (See Comments)    Muscle pain  . Betadine [Povidone Iodine] Rash  . Tetracyclines & Related Rash    Other reaction(s): Unknown     Current Outpatient Medications:  .  ACCU-CHEK AVIVA PLUS test strip, TEST BLOOD SUGAR THREE  TIMES DAILY, Disp: 300 strip, Rfl: 2 .  ACCU-CHEK SOFTCLIX LANCETS lancets, CHECK  BLOOD SUGAR THREE TIMES DAILY, Disp: 300 each, Rfl: 3 .  amLODipine (NORVASC) 10 MG tablet, TAKE 1 TABLET BY MOUTH  DAILY, Disp: 90 tablet, Rfl: 3 .  amoxicillin-clavulanate (AUGMENTIN) 875-125 MG tablet, Take 1 tablet by mouth 2 (two) times daily. (Patient not taking: Reported on 10/03/2019), Disp: 14 tablet, Rfl: 0 .  aspirin 81 MG chewable tablet, Chew 1 tablet (81 mg total) by mouth daily., Disp: , Rfl:  .  atorvastatin (LIPITOR) 20 MG tablet, TAKE 1 TABLET BY MOUTH  DAILY, Disp: 90 tablet, Rfl: 3 .  Blood Glucose Monitoring Suppl (ACCU-CHEK AVIVA PLUS) w/Device KIT, Check sugar three times daily DX E11.9-needs a meter, Disp: 1 kit, Rfl: 0 .  ciprofloxacin (CIPRO) 250 MG tablet, Take 1 tablet (250 mg total) by mouth 2 (two) times daily., Disp: 6 tablet, Rfl: 0 .  cloNIDine (CATAPRES) 0.3 MG tablet, Take 1 tablet (0.3 mg total) by mouth 2 (two) times daily., Disp: 180 tablet, Rfl: 3 .  clopidogrel (PLAVIX) 75 MG tablet, TAKE 1 TABLET BY MOUTH  DAILY, Disp: 90 tablet, Rfl: 1 .  Continuous Blood Gluc Receiver (FREESTYLE LIBRE 14 DAY READER) DEVI, LENGTH OF NEED: LIFETIME - UNLESS SPECIFIED OTHERWISE, Disp: 2 Device, Rfl: 11 .  Continuous Blood Gluc Sensor (FREESTYLE LIBRE 14 DAY SENSOR) MISC, LENGTH OF NEED: LIFETIME - UNLESS SPECIFIED OTHERWISE, Disp: 2 each, Rfl: 0 .  enalapril (VASOTEC) 20 MG tablet, Take 1 tablet (20 mg total) by mouth 2 (two) times daily., Disp: 180 tablet, Rfl: 3 .  estradiol (ESTRACE) 0.1 MG/GM vaginal cream, APPLY A SMALL AMOUNT TOPICALLY TO URETHRA AREA AT BEDTIME DAILY FOR 1 WEEK THEN THREE TIMES A WEEK FOR 3 WEEKS, Disp: , Rfl:  .  gabapentin (NEURONTIN) 100 MG capsule, Take 200 mg by mouth 3 (three) times daily., Disp: , Rfl:  .  hydrALAZINE (APRESOLINE) 100 MG tablet, Take 1 tablet (100 mg total) by mouth 2 (two) times daily., Disp: 180 tablet, Rfl: 2 .  HYDROcodone-acetaminophen (NORCO) 10-325 MG tablet, Take 1 tablet by mouth every 6 (six) hours as needed., Disp: 100  tablet, Rfl: 0 .  insulin degludec (TRESIBA FLEXTOUCH) 100 UNIT/ML SOPN FlexTouch Pen, ADMINISTER UP TO 30 UNITS UNDER THE SKIN DAILY, Disp: 9 mL, Rfl: 0 .  Lancet Devices (ACCU-CHEK SOFTCLIX) lancets, Check sugar three times daily DX E11.9, Disp: 100 each, Rfl: 3 .  levETIRAcetam (KEPPRA) 500 MG tablet, TAKE 1 TABLET BY MOUTH 2  TIMES DAILY, Disp: 180 tablet, Rfl: 3 .  metFORMIN (GLUCOPHAGE) 1000 MG tablet, TAKE 1 TABLET BY MOUTH TWO  TIMES DAILY, Disp: 180 tablet, Rfl: 1 .  metoprolol succinate (TOPROL-XL) 100 MG 24 hr tablet, TAKE 1 TABLET BY MOUTH  DAILY, Disp: 90 tablet, Rfl: 3 .  nitrofurantoin, macrocrystal-monohydrate, (MACROBID) 100 MG capsule, Take 1 capsule (100 mg total) by mouth 2 (two) times daily., Disp: 10 capsule, Rfl: 0 .  nitroGLYCERIN (NITROSTAT) 0.4 MG SL tablet, Place 1 tablet (0.4 mg total) under the tongue every 5 (five) minutes as needed for chest pain., Disp: 25 tablet, Rfl: 0 .  ondansetron (ZOFRAN) 4 MG tablet, Take 1 tablet (4 mg total) by mouth every 8 (eight) hours as needed for nausea or vomiting., Disp: 60 tablet, Rfl: 5 .  oxybutynin (DITROPAN) 5 MG tablet, Take 1 tablet (5 mg total) by mouth 2 (two) times daily., Disp: 180 tablet, Rfl: 3 .  pantoprazole (PROTONIX) 40 MG tablet, TAKE 1 TABLET BY MOUTH  DAILY, Disp: 90 tablet, Rfl: 3 .  traMADol-acetaminophen (ULTRACET) 37.5-325 MG tablet, Take 1 tablet by mouth every 6 (six) hours as needed., Disp: 30 tablet, Rfl: 0  Review of Systems  Constitutional: Negative.   Gastrointestinal: Negative for nausea.  Neurological: Positive for headaches. Negative for dizziness.    Social History   Tobacco Use  . Smoking status: Never Smoker  . Smokeless tobacco: Never Used  Substance Use Topics  . Alcohol use: No      Objective:   There were no vitals taken for this visit. There were no vitals filed for this visit.There is no height or weight on file to calculate BMI.   Physical Exam   No results found for any  visits on 10/16/19.     Assessment & Plan    1. Nonintractable headache, unspecified chronicity pattern, unspecified headache type  Resolved today, return precautions counseled. Symptoms have been ongoing x 2 days. Stressed importance of timely evaluation in potential stroke symptoms. Counseled she should go to ER if she has stroke symptoms. As she has not been vaccinated for COVID, I have counseled her headache can also be part of COVID symptoms and she should consider testing.    2. 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 The patient was provided an opportunity to ask questions and all were answered. The patient agreed with the plan and demonstrated an understanding of the instructions.   The patient was advised to call back  or seek an in-person evaluation if the symptoms worsen or if the condition fails to improve as anticipated.  I provided 15 minutes of non-face-to-face time during this encounter.  The entirety of the information documented in the History of Present Illness, Review of Systems and Physical Exam were personally obtained by me. Portions of this information were initially documented by [facility], CMA and reviewed by me for thoroughness and accuracy.                Trinna Post, PA-C  [facility]

## 2019-10-16 NOTE — Progress Notes (Deleted)
Patient: Susan House Female    DOB: [birth year]   64 y.o.   MRN: [id number] Visit Date: 10/16/2019  Today's Provider: Trinna Post, PA-C   Chief Complaint  Patient presents with  . Headache   Subjective:    Virtual Visit via Telephone Note  I connected with Susan House on 10/16/19 at  8:00 AM EDT by telephone and verified that I am speaking with the correct person using two identifiers.   I discussed the limitations, risks, security and privacy concerns of performing an evaluation and management service by telephone and the availability of in person appointments. I also discussed with the patient that there may be a patient responsible charge related to this service. The patient expressed understanding and agreed to proceed.   Headache   Patient with history of CVA, HTN, HLD reports headaches ongoing yesterday and today. She reports she feels much better this morning. Denies visual changes, weakness and imbalance above baseline. She denies vaccination for COVID. She denies SOB or coughing. Patient unable to get vitals this morning, not out of bed yet.   Allergies  Allergen Reactions  . Morphine And Related Anaphylaxis  . Fentanyl Other (See Comments)    Unknown reaction.  . Reglan [Metoclopramide]     Other reaction(s): Unknown Elevated BP  . Simvastatin Other (See Comments)    Muscle pain  . Betadine [Povidone Iodine] Rash  . Tetracyclines & Related Rash    Other reaction(s): Unknown     Current Outpatient Medications:  .  ACCU-CHEK AVIVA PLUS test strip, TEST BLOOD SUGAR THREE  TIMES DAILY, Disp: 300 strip, Rfl: 2 .  ACCU-CHEK SOFTCLIX LANCETS lancets, CHECK BLOOD SUGAR THREE TIMES DAILY, Disp: 300 each, Rfl: 3 .  amLODipine (NORVASC) 10 MG tablet, TAKE 1 TABLET BY MOUTH  DAILY, Disp: 90 tablet, Rfl: 3 .  amoxicillin-clavulanate (AUGMENTIN) 875-125 MG tablet, Take 1 tablet by mouth 2 (two) times daily. (Patient not taking: Reported on  10/03/2019), Disp: 14 tablet, Rfl: 0 .  aspirin 81 MG chewable tablet, Chew 1 tablet (81 mg total) by mouth daily., Disp: , Rfl:  .  atorvastatin (LIPITOR) 20 MG tablet, TAKE 1 TABLET BY MOUTH  DAILY, Disp: 90 tablet, Rfl: 3 .  Blood Glucose Monitoring Suppl (ACCU-CHEK AVIVA PLUS) w/Device KIT, Check sugar three times daily DX E11.9-needs a meter, Disp: 1 kit, Rfl: 0 .  ciprofloxacin (CIPRO) 250 MG tablet, Take 1 tablet (250 mg total) by mouth 2 (two) times daily., Disp: 6 tablet, Rfl: 0 .  cloNIDine (CATAPRES) 0.3 MG tablet, Take 1 tablet (0.3 mg total) by mouth 2 (two) times daily., Disp: 180 tablet, Rfl: 3 .  clopidogrel (PLAVIX) 75 MG tablet, TAKE 1 TABLET BY MOUTH  DAILY, Disp: 90 tablet, Rfl: 1 .  Continuous Blood Gluc Receiver (FREESTYLE LIBRE 14 DAY READER) DEVI, LENGTH OF NEED: LIFETIME - UNLESS SPECIFIED OTHERWISE, Disp: 2 Device, Rfl: 11 .  Continuous Blood Gluc Sensor (FREESTYLE LIBRE 14 DAY SENSOR) MISC, LENGTH OF NEED: LIFETIME - UNLESS SPECIFIED OTHERWISE, Disp: 2 each, Rfl: 0 .  enalapril (VASOTEC) 20 MG tablet, Take 1 tablet (20 mg total) by mouth 2 (two) times daily., Disp: 180 tablet, Rfl: 3 .  estradiol (ESTRACE) 0.1 MG/GM vaginal cream, APPLY A SMALL AMOUNT TOPICALLY TO URETHRA AREA AT BEDTIME DAILY FOR 1 WEEK THEN THREE TIMES A WEEK FOR 3 WEEKS, Disp: , Rfl:  .  gabapentin (NEURONTIN) 100 MG capsule, Take 200 mg  by mouth 3 (three) times daily., Disp: , Rfl:  .  hydrALAZINE (APRESOLINE) 100 MG tablet, Take 1 tablet (100 mg total) by mouth 2 (two) times daily., Disp: 180 tablet, Rfl: 2 .  HYDROcodone-acetaminophen (NORCO) 10-325 MG tablet, Take 1 tablet by mouth every 6 (six) hours as needed., Disp: 100 tablet, Rfl: 0 .  insulin degludec (TRESIBA FLEXTOUCH) 100 UNIT/ML SOPN FlexTouch Pen, ADMINISTER UP TO 30 UNITS UNDER THE SKIN DAILY, Disp: 9 mL, Rfl: 0 .  Lancet Devices (ACCU-CHEK SOFTCLIX) lancets, Check sugar three times daily DX E11.9, Disp: 100 each, Rfl: 3 .  levETIRAcetam  (KEPPRA) 500 MG tablet, TAKE 1 TABLET BY MOUTH 2  TIMES DAILY, Disp: 180 tablet, Rfl: 3 .  metFORMIN (GLUCOPHAGE) 1000 MG tablet, TAKE 1 TABLET BY MOUTH TWO  TIMES DAILY, Disp: 180 tablet, Rfl: 1 .  metoprolol succinate (TOPROL-XL) 100 MG 24 hr tablet, TAKE 1 TABLET BY MOUTH  DAILY, Disp: 90 tablet, Rfl: 3 .  nitrofurantoin, macrocrystal-monohydrate, (MACROBID) 100 MG capsule, Take 1 capsule (100 mg total) by mouth 2 (two) times daily., Disp: 10 capsule, Rfl: 0 .  nitroGLYCERIN (NITROSTAT) 0.4 MG SL tablet, Place 1 tablet (0.4 mg total) under the tongue every 5 (five) minutes as needed for chest pain., Disp: 25 tablet, Rfl: 0 .  ondansetron (ZOFRAN) 4 MG tablet, Take 1 tablet (4 mg total) by mouth every 8 (eight) hours as needed for nausea or vomiting., Disp: 60 tablet, Rfl: 5 .  oxybutynin (DITROPAN) 5 MG tablet, Take 1 tablet (5 mg total) by mouth 2 (two) times daily., Disp: 180 tablet, Rfl: 3 .  pantoprazole (PROTONIX) 40 MG tablet, TAKE 1 TABLET BY MOUTH  DAILY, Disp: 90 tablet, Rfl: 3 .  traMADol-acetaminophen (ULTRACET) 37.5-325 MG tablet, Take 1 tablet by mouth every 6 (six) hours as needed., Disp: 30 tablet, Rfl: 0  Review of Systems  Neurological: Positive for headaches.    Social History   Tobacco Use  . Smoking status: Never Smoker  . Smokeless tobacco: Never Used  Substance Use Topics  . Alcohol use: No      Objective:   There were no vitals taken for this visit. There were no vitals filed for this visit.There is no height or weight on file to calculate BMI.   Physical Exam   No results found for any visits on 10/16/19.     Assessment & Plan    1. Nonintractable headache, unspecified chronicity pattern, unspecified headache type  Resolved today, return precautions counseled. Counseled she should go to ER if she has stroke symptoms. As she has not been vaccinated for COVID, I have counseled her headache can also be part of COVID symptoms  2. Cerebrovascular accident  (CVA) due to occlusion of left middle cerebral artery ([place]) ***     Trinna Post, PA-C  [facility]

## 2019-10-29 ENCOUNTER — Ambulatory Visit: Payer: Self-pay | Admitting: *Deleted

## 2019-10-29 ENCOUNTER — Telehealth: Payer: Self-pay | Admitting: Family Medicine

## 2019-10-29 NOTE — Telephone Encounter (Signed)
Attempted to call patient back- left message on VM to call back regarding symptoms. ( Patient treated 3/19) Incomplete triage note started

## 2019-10-29 NOTE — Telephone Encounter (Signed)
Pt called and is requesting to have a medication for a uti sent in. Pt declined appt and is requesting to just have medication sent in. Please advise.    [id number] Susan House, [address]  [place] [address]  Phone: [phone] Fax: [phone]  Not a 24 hour pharmacy; exact hours not known.

## 2019-10-29 NOTE — Telephone Encounter (Signed)
Pt called and is requesting to have a medication for a uti sent in. Pt declined appt and is requesting to just have medication sent in. Please advise.   Attempted to call patient - left message to call back regarding symptoms.

## 2019-10-30 ENCOUNTER — Other Ambulatory Visit (INDEPENDENT_AMBULATORY_CARE_PROVIDER_SITE_OTHER): Payer: Medicare Other | Admitting: *Deleted

## 2019-10-30 DIAGNOSIS — N3090 Cystitis, unspecified without hematuria: Secondary | ICD-10-CM | POA: Diagnosis not present

## 2019-10-30 LAB — POCT URINALYSIS DIPSTICK
Appearance: ABNORMAL
Bilirubin, UA: NEGATIVE
Glucose, UA: POSITIVE — AB
Nitrite, UA: NEGATIVE
Odor: ABNORMAL
Protein, UA: POSITIVE — AB
Spec Grav, UA: 1.02 (ref 1.010–1.025)
Urobilinogen, UA: 0.2 E.U./dL
pH, UA: 6 (ref 5.0–8.0)

## 2019-10-30 NOTE — Telephone Encounter (Signed)
Per Dr. Rosanna Randy, patient can drop urine off for testing.

## 2019-10-30 NOTE — Telephone Encounter (Signed)
Patient's husband was advised. 

## 2019-10-31 ENCOUNTER — Other Ambulatory Visit: Payer: Self-pay | Admitting: *Deleted

## 2019-10-31 DIAGNOSIS — N3090 Cystitis, unspecified without hematuria: Secondary | ICD-10-CM

## 2019-10-31 MED ORDER — CIPROFLOXACIN HCL 250 MG PO TABS: 250.0000 mg | ORAL_TABLET | Freq: Two times a day (BID) | ORAL | 0 refills | Status: DC

## 2019-11-03 LAB — CULTURE, URINE COMPREHENSIVE

## 2019-11-04 ENCOUNTER — Telehealth: Payer: Self-pay | Admitting: *Deleted

## 2019-11-04 DIAGNOSIS — E113553 Type 2 diabetes mellitus with stable proliferative diabetic retinopathy, bilateral: Secondary | ICD-10-CM | POA: Diagnosis not present

## 2019-11-04 LAB — HM DIABETES EYE EXAM

## 2019-11-04 NOTE — Telephone Encounter (Signed)
-----   Message from Jerrol Banana., MD sent at 11/03/2019  8:33 AM EDT ----- No urinary tract infection.

## 2019-11-04 NOTE — Telephone Encounter (Signed)
Tried calling pt, no answer and no vm. Okay for [facility] triage to give pt results.

## 2019-11-04 NOTE — Telephone Encounter (Signed)
Patient advised as below.  

## 2019-11-09 ENCOUNTER — Emergency Department: Payer: Medicare Other

## 2019-11-09 ENCOUNTER — Other Ambulatory Visit: Payer: Self-pay

## 2019-11-09 ENCOUNTER — Inpatient Hospital Stay
Admission: EM | Admit: 2019-11-09 | Discharge: 2019-11-13 | DRG: 184 | Disposition: A | Discharge status: Home Health | Payer: Medicare Other | Attending: Internal Medicine | Admitting: Internal Medicine

## 2019-11-09 DIAGNOSIS — Z743 Need for continuous supervision: Secondary | ICD-10-CM | POA: Diagnosis not present

## 2019-11-09 DIAGNOSIS — S2242XD Multiple fractures of ribs, left side, subsequent encounter for fracture with routine healing: Secondary | ICD-10-CM | POA: Diagnosis not present

## 2019-11-09 DIAGNOSIS — Z7982 Long term (current) use of aspirin: Secondary | ICD-10-CM | POA: Diagnosis not present

## 2019-11-09 DIAGNOSIS — S20212A Contusion of left front wall of thorax, initial encounter: Secondary | ICD-10-CM

## 2019-11-09 DIAGNOSIS — Z8249 Family history of ischemic heart disease and other diseases of the circulatory system: Secondary | ICD-10-CM | POA: Diagnosis not present

## 2019-11-09 DIAGNOSIS — E1165 Type 2 diabetes mellitus with hyperglycemia: Secondary | ICD-10-CM | POA: Diagnosis present

## 2019-11-09 DIAGNOSIS — E1142 Type 2 diabetes mellitus with diabetic polyneuropathy: Secondary | ICD-10-CM | POA: Diagnosis present

## 2019-11-09 DIAGNOSIS — E1151 Type 2 diabetes mellitus with diabetic peripheral angiopathy without gangrene: Secondary | ICD-10-CM | POA: Diagnosis not present

## 2019-11-09 DIAGNOSIS — E1122 Type 2 diabetes mellitus with diabetic chronic kidney disease: Secondary | ICD-10-CM | POA: Diagnosis not present

## 2019-11-09 DIAGNOSIS — Z79891 Long term (current) use of opiate analgesic: Secondary | ICD-10-CM | POA: Diagnosis not present

## 2019-11-09 DIAGNOSIS — Z66 Do not resuscitate: Secondary | ICD-10-CM | POA: Diagnosis present

## 2019-11-09 DIAGNOSIS — S52615A Nondisplaced fracture of left ulna styloid process, initial encounter for closed fracture: Secondary | ICD-10-CM | POA: Diagnosis present

## 2019-11-09 DIAGNOSIS — I2510 Atherosclerotic heart disease of native coronary artery without angina pectoris: Secondary | ICD-10-CM | POA: Diagnosis present

## 2019-11-09 DIAGNOSIS — Z833 Family history of diabetes mellitus: Secondary | ICD-10-CM

## 2019-11-09 DIAGNOSIS — I10 Essential (primary) hypertension: Secondary | ICD-10-CM | POA: Diagnosis not present

## 2019-11-09 DIAGNOSIS — S52612A Displaced fracture of left ulna styloid process, initial encounter for closed fracture: Secondary | ICD-10-CM | POA: Diagnosis not present

## 2019-11-09 DIAGNOSIS — S2242XA Multiple fractures of ribs, left side, initial encounter for closed fracture: Secondary | ICD-10-CM | POA: Diagnosis not present

## 2019-11-09 DIAGNOSIS — R0902 Hypoxemia: Secondary | ICD-10-CM | POA: Diagnosis not present

## 2019-11-09 DIAGNOSIS — S62341D Nondisplaced fracture of base of second metacarpal bone. left hand, subsequent encounter for fracture with routine healing: Secondary | ICD-10-CM | POA: Diagnosis not present

## 2019-11-09 DIAGNOSIS — S0003XA Contusion of scalp, initial encounter: Secondary | ICD-10-CM | POA: Diagnosis not present

## 2019-11-09 DIAGNOSIS — S2249XA Multiple fractures of ribs, unspecified side, initial encounter for closed fracture: Secondary | ICD-10-CM | POA: Diagnosis not present

## 2019-11-09 DIAGNOSIS — R52 Pain, unspecified: Secondary | ICD-10-CM | POA: Diagnosis not present

## 2019-11-09 DIAGNOSIS — Z79899 Other long term (current) drug therapy: Secondary | ICD-10-CM | POA: Diagnosis not present

## 2019-11-09 DIAGNOSIS — Z7902 Long term (current) use of antithrombotics/antiplatelets: Secondary | ICD-10-CM | POA: Diagnosis not present

## 2019-11-09 DIAGNOSIS — N1831 Chronic kidney disease, stage 3a: Secondary | ICD-10-CM | POA: Diagnosis present

## 2019-11-09 DIAGNOSIS — Z83438 Family history of other disorder of lipoprotein metabolism and other lipidemia: Secondary | ICD-10-CM | POA: Diagnosis not present

## 2019-11-09 DIAGNOSIS — S0083XA Contusion of other part of head, initial encounter: Secondary | ICD-10-CM | POA: Diagnosis not present

## 2019-11-09 DIAGNOSIS — I129 Hypertensive chronic kidney disease with stage 1 through stage 4 chronic kidney disease, or unspecified chronic kidney disease: Secondary | ICD-10-CM | POA: Diagnosis not present

## 2019-11-09 DIAGNOSIS — W19XXXA Unspecified fall, initial encounter: Secondary | ICD-10-CM | POA: Diagnosis not present

## 2019-11-09 DIAGNOSIS — S92424D Nondisplaced fracture of distal phalanx of right great toe, subsequent encounter for fracture with routine healing: Secondary | ICD-10-CM | POA: Diagnosis not present

## 2019-11-09 DIAGNOSIS — R531 Weakness: Secondary | ICD-10-CM | POA: Diagnosis present

## 2019-11-09 DIAGNOSIS — D649 Anemia, unspecified: Secondary | ICD-10-CM | POA: Diagnosis not present

## 2019-11-09 DIAGNOSIS — N3281 Overactive bladder: Secondary | ICD-10-CM | POA: Diagnosis not present

## 2019-11-09 DIAGNOSIS — W010XXA Fall on same level from slipping, tripping and stumbling without subsequent striking against object, initial encounter: Secondary | ICD-10-CM | POA: Diagnosis present

## 2019-11-09 DIAGNOSIS — S62341A Nondisplaced fracture of base of second metacarpal bone. left hand, initial encounter for closed fracture: Secondary | ICD-10-CM | POA: Diagnosis present

## 2019-11-09 DIAGNOSIS — S199XXA Unspecified injury of neck, initial encounter: Secondary | ICD-10-CM | POA: Diagnosis not present

## 2019-11-09 DIAGNOSIS — G8194 Hemiplegia, unspecified affecting left nondominant side: Secondary | ICD-10-CM

## 2019-11-09 DIAGNOSIS — E0822 Diabetes mellitus due to underlying condition with diabetic chronic kidney disease: Secondary | ICD-10-CM | POA: Diagnosis present

## 2019-11-09 DIAGNOSIS — S92424A Nondisplaced fracture of distal phalanx of right great toe, initial encounter for closed fracture: Secondary | ICD-10-CM | POA: Diagnosis not present

## 2019-11-09 DIAGNOSIS — I639 Cerebral infarction, unspecified: Secondary | ICD-10-CM | POA: Diagnosis present

## 2019-11-09 DIAGNOSIS — Z20822 Contact with and (suspected) exposure to covid-19: Secondary | ICD-10-CM | POA: Diagnosis present

## 2019-11-09 DIAGNOSIS — I313 Pericardial effusion (noninflammatory): Secondary | ICD-10-CM | POA: Diagnosis present

## 2019-11-09 DIAGNOSIS — I739 Peripheral vascular disease, unspecified: Secondary | ICD-10-CM | POA: Diagnosis present

## 2019-11-09 DIAGNOSIS — E0821 Diabetes mellitus due to underlying condition with diabetic nephropathy: Secondary | ICD-10-CM | POA: Diagnosis not present

## 2019-11-09 DIAGNOSIS — N183 Chronic kidney disease, stage 3 unspecified: Secondary | ICD-10-CM | POA: Diagnosis present

## 2019-11-09 DIAGNOSIS — S0510XA Contusion of eyeball and orbital tissues, unspecified eye, initial encounter: Secondary | ICD-10-CM | POA: Diagnosis not present

## 2019-11-09 MED ORDER — ACETAMINOPHEN 500 MG PO TABS
1000.0000 mg | ORAL_TABLET | Freq: Once | ORAL | Status: AC
  Administered 2019-11-10: 1000 mg via ORAL
  Filled 2019-11-09: qty 2

## 2019-11-09 NOTE — ED Provider Notes (Signed)
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 Electronically Signed   By: Prudencio Pair M.D.   On: 11/10/2019 00:07   DG Toe Great Right  Result Date: 11/10/2019 CLINICAL DATA:  Status post fall. EXAM: RIGHT GREAT TOE COMPARISON:  None. FINDINGS: A nondisplaced fracture deformity is seen extending through the distal phalanx of the right great toe. There is no evidence of dislocation. There is no evidence of arthropathy or other focal bone abnormality. Mild diffuse soft tissue swelling is seen. IMPRESSION: Nondisplaced fracture of the distal phalanx of the right great toe. Electronically  Signed   By: Virgina Norfolk M.D.   On: 11/10/2019 00:24   CT Maxillofacial Wo Contrast  Result Date: 11/10/2019 CLINICAL DATA:  Fall after hitting her left side of the face EXAM: CT HEAD WITHOUT CONTRAST TECHNIQUE: Contiguous axial images were obtained from the base of the skull through the vertex without intravenous  contrast. COMPARISON:  September 25, 2018 FINDINGS: Brain: No evidence of acute territorial infarction, hemorrhage, hydrocephalus,extra-axial collection or mass lesion/mass effect. There is dilatation the ventricles and sulci consistent with age-related atrophy. Low-attenuation changes in the deep white matter consistent with small vessel ischemia. Again noted is a prior infarct involving the left frontal lobe with ex vacuo dilatation of the left lateral ventricle. Prior lacunar infarcts cervical within the bilateral basal ganglia. Vascular: No hyperdense vessel or unexpected calcification. Skull: The skull is intact. No fracture or focal lesion identified. Sinuses/Orbits: The visualized paranasal sinuses and mastoid air cells are clear. The orbits and globes intact. Other: There is a small soft tissue hematoma seen overlying the left frontal skull and periorbital region. Face: Osseous: No acute fracture or other significant osseous abnormality.The nasal bone, mandibles, zygomatic arches and pterygoid plates are intact. Orbits: No fracture identified. Unremarkable appearance of globes and orbits. No retro-orbital collections are seen. Sinuses: The visualized paranasal sinuses and mastoid air cells are unremarkable. Soft tissues: Small soft tissue hematoma seen overlying the left frontal skull and periorbital region. Limited intracranial: No acute findings. Cervical spine: Alignment: There is straightening of the normal cervical lordosis. Skull base and vertebrae: Visualized skull base is intact. No atlanto-occipital dissociation. The vertebral body heights are well maintained. No fracture or pathologic osseous lesion seen. Soft tissues and spinal canal: The visualized paraspinal soft tissues are unremarkable. No prevertebral soft tissue swelling is seen. The spinal canal is grossly unremarkable, no large epidural collection or significant canal narrowing. Disc levels: Disc height loss with uncovertebral osteophytes and  disc osteophyte complex is most notable at C5-C6 and C6-C7 with mild neural foraminal and canal narrowing. Upper chest: The lung apices are clear. Thoracic inlet is within normal limits. Other: Scattered carotid artery calcifications are seen. Aortic atherosclerosis is noted. For which IMPRESSION: No acute intracranial abnormality. Findings consistent with age related atrophy and chronic small vessel ischemia Prior lacunar infarcts as described above Soft tissue hematoma and swelling over the left frontal skull and periorbital region No acute facial fracture No acute fracture or malalignment of the spine. Electronically Signed   By: Prudencio Pair M.D.   On: 11/10/2019 00:07    ____________________________________________   PROCEDURES   Procedure(s) performed (including Critical Care):  .Ortho Injury Treatment  Date/Time: 11/10/2019 3:14 AM Performed by: Hinda Kehr, MD Authorized by: Hinda Kehr, MD   Consent:    Consent obtained:  Verbal   Consent given by:  Patient   Risks discussed:  FractureInjury location: wrist Location details: left wrist Injury type: fracture Fracture type: ulnar styloid Pre-procedure distal perfusion: normal Pre-procedure neurological function: diminished Pre-procedure neurological function comment: at baseline from prior CVA Pre-procedure range of motion: reduced  Anesthesia: Local anesthesia used: no  Patient sedated: NoManipulation performed: no Immobilization: splint Splint type: sugar tong Supplies used: Ortho-Glass Post-procedure distal perfusion: normal Post-procedure neurological function: diminished Post-procedure range of motion: unchanged Patient tolerance: patient tolerated the procedure well with no immediate complications      ____________________________________________   INITIAL IMPRESSION / MDM / [place] / ED COURSE  As part of my medical decision making, I reviewed the following data  within the [facility] notes reviewed and incorporated, Labs reviewed , EKG interpreted , Old chart reviewed, Radiograph reviewed , Discussed with admitting physician , Notes from prior ED visits and [place] Controlled Substance Database   Differential diagnosis includes, but is not limited to, contusion(s), fractures, dislocations, orbital fracture, intracranial bleeding, C-spine injury.  No indication for lab work at this time, vitals stable and reassuring.  Imaging pending:  CTs head/face/C-spine, and radiographs of left wrist, left ribs/chest, and right great toe.  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  EKG reassuring with no concerning abnormalities.      Clinical Course as of Nov 10 442  Mon Nov 10, 2019  0113 Patient has fractures to left wrist, right great toe, and at least 2 rib fractures.  Given the amount of discomfort she is experiencing in the left chest wall and given her body habitus which I believe is limiting the exam, I will proceed with CT chest without contrast for further assessment of possible traumatic injury.  I discussed the findings with the patient and she agrees with the additional assessment.  Of note, she states that the anaphylactic reaction she had morphine was specifically to morphine, and that she has had oxycodone, hydrocodone, and codeine subsequently without any negative effects.  I will give her a dose of oxycodone 10 mg by mouth to supplement the Tylenol she has already received.  Disposition unclear at this point.   [CF]  0215 Placing a left sugar tong splint for ulnar styloid fracture and possible second metacarpal fracture.   [CF]  0216 Total of 7 acute rib fractures seen on CT chest.  Pain not improved after oral medication (tylenol, then oxycodone).  Patient needs admission for pain control, incentive spirometry, ortho consult for wrist and toe injury, and PT/OT  assessment for probable rehab placement.  Fentanyl 25 mcg IV PRN ordered for pain.  Will admit after labs are back.   [CF]  0256 mild non-specific leukocytosis  CBC with Differential/Platelet(!) [CF]  0301 UA unlikely to represent UTI, will not treat empirically, urine culture in process   [CF]  0314 Stable creatinine with the least degree of chronic kidney disease.  No acute abnormalities on lab work identified.  Consulting hospitalist service for admission.  Of note, the patient is called out stating that her pain is back and she is getting fentanyl 25 mcg IV now.  Basic metabolic panel(!) [CF]  8315 Discussed case by phone with Dr. Sidney Ace who will admit.   [CF]    Clinical Course User Index [CF] Hinda Kehr, MD     ____________________________________________  FINAL CLINICAL IMPRESSION(S) / ED DIAGNOSES  Final diagnoses:  Fall, initial encounter  Contusion of face, initial encounter  Closed fracture of multiple ribs of left side, initial encounter  Contusion of left chest wall, initial encounter  Traumatic closed fracture of ulnar styloid with minimal displacement, left, initial encounter  Closed nondisplaced fracture of base of second metacarpal bone of left hand, initial encounter  Closed nondisplaced fracture of distal phalanx of right great toe, initial encounter  Left hemiplegia ([place])     MEDICATIONS GIVEN DURING THIS VISIT:  Medications  fentaNYL (SUBLIMAZE) injection 25 mcg (25 mcg Intravenous Given 11/10/19 0314)  aspirin chewable tablet 81 mg (has no administration in time range)  ciprofloxacin (CIPRO) tablet 250 mg (has no administration in time range)  amLODipine (  NORVASC) tablet 10 mg (has no administration in time range)  atorvastatin (LIPITOR) tablet 20 mg (has no administration in time range)  cloNIDine (CATAPRES) tablet 0.3 mg (has no administration in time range)  enalapril (VASOTEC) tablet 20 mg (has no administration in time range)  hydrALAZINE  (APRESOLINE) tablet 100 mg (has no administration in time range)  metoprolol succinate (TOPROL-XL) 24 hr tablet 100 mg (has no administration in time range)  insulin degludec (TRESIBA) 100 UNIT/ML FlexTouch Pen 20 Units (has no administration in time range)  ondansetron (ZOFRAN) tablet 4 mg (has no administration in time range)  pantoprazole (PROTONIX) EC tablet 40 mg (has no administration in time range)  oxybutynin (DITROPAN) tablet 5 mg (has no administration in time range)  clopidogrel (PLAVIX) tablet 75 mg (has no administration in time range)  gabapentin (NEURONTIN) capsule 200 mg (has no administration in time range)  levETIRAcetam (KEPPRA) tablet 500 mg (has no administration in time range)  enoxaparin (LOVENOX) injection 40 mg (has no administration in time range)  0.9 %  sodium chloride infusion (has no administration in time range)  acetaminophen (TYLENOL) tablet 650 mg (has no administration in time range)    Or  acetaminophen (TYLENOL) suppository 650 mg (has no administration in time range)  traZODone (DESYREL) tablet 25 mg (has no administration in time range)  ketorolac (TORADOL) 15 MG/ML injection 15 mg (has no administration in time range)  magnesium hydroxide (MILK OF MAGNESIA) suspension 30 mL (has no administration in time range)  ondansetron (ZOFRAN) tablet 4 mg (has no administration in time range)    Or  ondansetron (ZOFRAN) injection 4 mg (has no administration in time range)  acetaminophen (TYLENOL) tablet 1,000 mg (1,000 mg Oral Given 11/10/19 0014)  oxyCODONE (Oxy IR/ROXICODONE) immediate release tablet 10 mg (10 mg Oral Given 11/10/19 0123)  ondansetron (ZOFRAN-ODT) disintegrating tablet 4 mg (4 mg Oral Given 11/10/19 0206)     ED Discharge Orders    None      *Please note:  Susan House was evaluated in Emergency Department on 11/10/2019 for the symptoms described in the history of present illness. She was evaluated in the context of the global  COVID-19 pandemic, which necessitated consideration that the patient might be at risk for infection with the SARS-CoV-2 virus that causes COVID-19. Institutional protocols and algorithms that pertain to the evaluation of patients at risk for COVID-19 are in a state of rapid change based on information released by regulatory bodies including the CDC and federal and state organizations. These policies and algorithms were followed during the patient's care in the ED.  Some ED evaluations and interventions may be delayed as a result of limited staffing during the pandemic.*  Note:  This document was prepared using Dragon voice recognition software and may include unintentional dictation errors.   Hinda Kehr, MD 11/10/19 [phone]

## 2019-11-09 NOTE — ED Triage Notes (Signed)
Pt comes from home via EMS due to a fall that occurred tonight. Pt fell and hit her left side of the face on the door frame and chest/stomach on the ground. PT on arrival has discoloration and swelling on the left side of the face around the eye and complains of pain in the face and left chest. Pt has a history of stroke, does not take blood thinners, denies LOC with fall.

## 2019-11-09 NOTE — ED Notes (Addendum)
Pt has swelling and discoloration on the left eye. Site is blue/purple in color. See triage note for details of fall. Pt has voided on self, requests female assistance for cleaning of void. Beth, Tech states she will assist with cleaning.

## 2019-11-10 ENCOUNTER — Emergency Department: Payer: Medicare Other

## 2019-11-10 DIAGNOSIS — S2242XA Multiple fractures of ribs, left side, initial encounter for closed fracture: Secondary | ICD-10-CM | POA: Diagnosis not present

## 2019-11-10 DIAGNOSIS — R531 Weakness: Secondary | ICD-10-CM

## 2019-11-10 DIAGNOSIS — S52612A Displaced fracture of left ulna styloid process, initial encounter for closed fracture: Secondary | ICD-10-CM | POA: Diagnosis not present

## 2019-11-10 DIAGNOSIS — E1142 Type 2 diabetes mellitus with diabetic polyneuropathy: Secondary | ICD-10-CM

## 2019-11-10 DIAGNOSIS — S0083XA Contusion of other part of head, initial encounter: Secondary | ICD-10-CM

## 2019-11-10 DIAGNOSIS — I10 Essential (primary) hypertension: Secondary | ICD-10-CM

## 2019-11-10 DIAGNOSIS — W19XXXA Unspecified fall, initial encounter: Secondary | ICD-10-CM

## 2019-11-10 DIAGNOSIS — S2249XA Multiple fractures of ribs, unspecified side, initial encounter for closed fracture: Secondary | ICD-10-CM

## 2019-11-10 LAB — CBC WITH DIFFERENTIAL/PLATELET
Abs Immature Granulocytes: 0.13 10*3/uL — ABNORMAL HIGH (ref 0.00–0.07)
Basophils Absolute: 0.1 10*3/uL (ref 0.0–0.1)
Basophils Relative: 1 %
Eosinophils Absolute: 0.2 10*3/uL (ref 0.0–0.5)
Eosinophils Relative: 2 %
HCT: 34.7 % — ABNORMAL LOW (ref 36.0–46.0)
Hemoglobin: 11.5 g/dL — ABNORMAL LOW (ref 12.0–15.0)
Immature Granulocytes: 1 %
Lymphocytes Relative: 11 %
Lymphs Abs: 1.4 10*3/uL (ref 0.7–4.0)
MCH: 28.4 pg (ref 26.0–34.0)
MCHC: 33.1 g/dL (ref 30.0–36.0)
MCV: 85.7 fL (ref 80.0–100.0)
Monocytes Absolute: 1 10*3/uL (ref 0.1–1.0)
Monocytes Relative: 8 %
Neutro Abs: 10 10*3/uL — ABNORMAL HIGH (ref 1.7–7.7)
Neutrophils Relative %: 77 %
Platelets: 247 10*3/uL (ref 150–400)
RBC: 4.05 MIL/uL (ref 3.87–5.11)
RDW: 13.3 % (ref 11.5–15.5)
WBC: 12.8 10*3/uL — ABNORMAL HIGH (ref 4.0–10.5)
nRBC: 0 % (ref 0.0–0.2)

## 2019-11-10 LAB — BASIC METABOLIC PANEL
Anion gap: 11 (ref 5–15)
Anion gap: 11 (ref 5–15)
BUN: 26 mg/dL — ABNORMAL HIGH (ref 8–23)
BUN: 28 mg/dL — ABNORMAL HIGH (ref 8–23)
CO2: 21 mmol/L — ABNORMAL LOW (ref 22–32)
CO2: 21 mmol/L — ABNORMAL LOW (ref 22–32)
Calcium: 8.5 mg/dL — ABNORMAL LOW (ref 8.9–10.3)
Calcium: 8.3 mg/dL — ABNORMAL LOW (ref 8.9–10.3)
Chloride: 104 mmol/L (ref 98–111)
Chloride: 104 mmol/L (ref 98–111)
Creatinine, Ser: 1.36 mg/dL — ABNORMAL HIGH (ref 0.44–1.00)
Creatinine, Ser: 1.24 mg/dL — ABNORMAL HIGH (ref 0.44–1.00)
GFR calc Af Amer: 48 mL/min — ABNORMAL LOW (ref >60)
GFR calc Af Amer: 54 mL/min — ABNORMAL LOW (ref >60)
GFR calc non Af Amer: 41 mL/min — ABNORMAL LOW (ref >60)
GFR calc non Af Amer: 46 mL/min — ABNORMAL LOW (ref >60)
Glucose, Bld: 302 mg/dL — ABNORMAL HIGH (ref 70–99)
Glucose, Bld: 311 mg/dL — ABNORMAL HIGH (ref 70–99)
Potassium: 4.4 mmol/L (ref 3.5–5.1)
Potassium: 4.4 mmol/L (ref 3.5–5.1)
Sodium: 136 mmol/L (ref 135–145)
Sodium: 136 mmol/L (ref 135–145)

## 2019-11-10 LAB — URINALYSIS, MICROSCOPIC (REFLEX)
RBC / HPF: NONE SEEN RBC/hpf (ref 0–5)
Squamous Epithelial / HPF: NONE SEEN (ref 0–5)

## 2019-11-10 LAB — HEMOGLOBIN A1C
Hgb A1c MFr Bld: 10 % — ABNORMAL HIGH (ref 4.8–5.6)
Mean Plasma Glucose: 240.3 mg/dL

## 2019-11-10 LAB — URINALYSIS, ROUTINE W REFLEX MICROSCOPIC
Bilirubin Urine: NEGATIVE
Glucose, UA: 500 mg/dL — AB
Hgb urine dipstick: NEGATIVE
Ketones, ur: NEGATIVE mg/dL
Nitrite: NEGATIVE
Protein, ur: >300 mg/dL — AB
Specific Gravity, Urine: 1.02 (ref 1.005–1.030)
pH: 5.5 (ref 5.0–8.0)

## 2019-11-10 LAB — CBC
HCT: 35.8 % — ABNORMAL LOW (ref 36.0–46.0)
Hemoglobin: 11.5 g/dL — ABNORMAL LOW (ref 12.0–15.0)
MCH: 28.1 pg (ref 26.0–34.0)
MCHC: 32.1 g/dL (ref 30.0–36.0)
MCV: 87.5 fL (ref 80.0–100.0)
Platelets: 262 10*3/uL (ref 150–400)
RBC: 4.09 MIL/uL (ref 3.87–5.11)
RDW: 13.4 % (ref 11.5–15.5)
WBC: 11.9 10*3/uL — ABNORMAL HIGH (ref 4.0–10.5)
nRBC: 0 % (ref 0.0–0.2)

## 2019-11-10 LAB — URINE CULTURE: Special Requests: NORMAL

## 2019-11-10 LAB — GLUCOSE, CAPILLARY
Glucose-Capillary: 313 mg/dL — ABNORMAL HIGH (ref 70–99)
Glucose-Capillary: 316 mg/dL — ABNORMAL HIGH (ref 70–99)
Glucose-Capillary: 155 mg/dL — ABNORMAL HIGH (ref 70–99)
Glucose-Capillary: 71 mg/dL (ref 70–99)
Glucose-Capillary: 135 mg/dL — ABNORMAL HIGH (ref 70–99)

## 2019-11-10 LAB — RESPIRATORY PANEL BY RT PCR (FLU A&B, COVID)
Influenza A by PCR: NEGATIVE
Influenza B by PCR: NEGATIVE
SARS Coronavirus 2 by RT PCR: NEGATIVE

## 2019-11-10 LAB — HIV ANTIBODY (ROUTINE TESTING W REFLEX): HIV Screen 4th Generation wRfx: NONREACTIVE

## 2019-11-10 MED ORDER — CIPROFLOXACIN HCL 500 MG PO TABS: 250.0000 mg | ORAL_TABLET | Freq: Two times a day (BID) | ORAL | Status: DC

## 2019-11-10 MED ORDER — MAGNESIUM HYDROXIDE 400 MG/5ML PO SUSP: 30.0000 mL | Freq: Every day | ORAL | Status: DC | PRN

## 2019-11-10 MED ORDER — ENOXAPARIN SODIUM 40 MG/0.4ML ~~LOC~~ SOLN
40.0000 mg | Freq: Two times a day (BID) | SUBCUTANEOUS | Status: DC
  Administered 2019-11-10 – 2019-11-13 (×7): 40 mg via SUBCUTANEOUS
  Filled 2019-11-10 (×7): qty 0.4

## 2019-11-10 MED ORDER — HYDRALAZINE HCL 50 MG PO TABS
100.0000 mg | ORAL_TABLET | Freq: Two times a day (BID) | ORAL | Status: DC
  Administered 2019-11-10 – 2019-11-13 (×7): 100 mg via ORAL
  Filled 2019-11-10 (×8): qty 2

## 2019-11-10 MED ORDER — ACETAMINOPHEN 325 MG PO TABS
650.0000 mg | ORAL_TABLET | Freq: Four times a day (QID) | ORAL | Status: DC | PRN
  Administered 2019-11-10 – 2019-11-12 (×5): 650 mg via ORAL
  Filled 2019-11-10 (×5): qty 2

## 2019-11-10 MED ORDER — ONDANSETRON HCL 4 MG/2ML IJ SOLN: 4.0000 mg | Freq: Four times a day (QID) | INTRAMUSCULAR | Status: DC | PRN

## 2019-11-10 MED ORDER — LEVETIRACETAM 500 MG PO TABS
500.0000 mg | ORAL_TABLET | Freq: Two times a day (BID) | ORAL | Status: DC
  Administered 2019-11-10 – 2019-11-13 (×7): 500 mg via ORAL
  Filled 2019-11-10 (×8): qty 1

## 2019-11-10 MED ORDER — OXYBUTYNIN CHLORIDE 5 MG PO TABS
5.0000 mg | ORAL_TABLET | Freq: Two times a day (BID) | ORAL | Status: DC
  Administered 2019-11-10 – 2019-11-13 (×7): 5 mg via ORAL
  Filled 2019-11-10 (×8): qty 1

## 2019-11-10 MED ORDER — CLONIDINE HCL 0.1 MG PO TABS
0.3000 mg | ORAL_TABLET | Freq: Two times a day (BID) | ORAL | Status: DC
  Administered 2019-11-10 – 2019-11-13 (×6): 0.3 mg via ORAL
  Filled 2019-11-10 (×7): qty 3

## 2019-11-10 MED ORDER — ONDANSETRON HCL 4 MG PO TABS: 4.0000 mg | ORAL_TABLET | Freq: Three times a day (TID) | ORAL | Status: DC | PRN

## 2019-11-10 MED ORDER — OXYCODONE HCL 5 MG PO TABS
10.0000 mg | ORAL_TABLET | ORAL | Status: AC
  Administered 2019-11-10: 10 mg via ORAL
  Filled 2019-11-10: qty 2

## 2019-11-10 MED ORDER — GABAPENTIN 100 MG PO CAPS
200.0000 mg | ORAL_CAPSULE | Freq: Three times a day (TID) | ORAL | Status: DC
  Administered 2019-11-10 – 2019-11-13 (×10): 200 mg via ORAL
  Filled 2019-11-10 (×10): qty 2

## 2019-11-10 MED ORDER — INSULIN ASPART 100 UNIT/ML ~~LOC~~ SOLN
0.0000 [IU] | Freq: Three times a day (TID) | SUBCUTANEOUS | Status: DC
  Administered 2019-11-10: 11 [IU] via SUBCUTANEOUS
  Administered 2019-11-10: 3 [IU] via SUBCUTANEOUS
  Administered 2019-11-10: 2 [IU] via SUBCUTANEOUS
  Administered 2019-11-11: 5 [IU] via SUBCUTANEOUS
  Administered 2019-11-11: 3 [IU] via SUBCUTANEOUS
  Administered 2019-11-11 – 2019-11-12 (×3): 8 [IU] via SUBCUTANEOUS
  Administered 2019-11-12: 5 [IU] via SUBCUTANEOUS
  Administered 2019-11-13 (×2): 3 [IU] via SUBCUTANEOUS
  Filled 2019-11-10 (×10): qty 1

## 2019-11-10 MED ORDER — METOPROLOL SUCCINATE ER 50 MG PO TB24
100.0000 mg | ORAL_TABLET | Freq: Every day | ORAL | Status: DC
  Administered 2019-11-11 – 2019-11-13 (×2): 100 mg via ORAL
  Filled 2019-11-10 (×2): qty 2

## 2019-11-10 MED ORDER — OXYCODONE HCL 5 MG PO TABS
10.0000 mg | ORAL_TABLET | Freq: Four times a day (QID) | ORAL | Status: DC | PRN
  Administered 2019-11-10 – 2019-11-13 (×5): 10 mg via ORAL
  Filled 2019-11-10 (×5): qty 2

## 2019-11-10 MED ORDER — ASPIRIN 81 MG PO CHEW
81.0000 mg | CHEWABLE_TABLET | Freq: Every day | ORAL | Status: DC
  Administered 2019-11-10 – 2019-11-13 (×4): 81 mg via ORAL
  Filled 2019-11-10 (×4): qty 1

## 2019-11-10 MED ORDER — ONDANSETRON 4 MG PO TBDP
4.0000 mg | ORAL_TABLET | Freq: Once | ORAL | Status: AC
  Administered 2019-11-10: 4 mg via ORAL
  Filled 2019-11-10: qty 1

## 2019-11-10 MED ORDER — ATORVASTATIN CALCIUM 20 MG PO TABS
20.0000 mg | ORAL_TABLET | Freq: Every day | ORAL | Status: DC
  Administered 2019-11-10 – 2019-11-12 (×3): 20 mg via ORAL
  Filled 2019-11-10 (×3): qty 1

## 2019-11-10 MED ORDER — ACETAMINOPHEN 650 MG RE SUPP: 650.0000 mg | Freq: Four times a day (QID) | RECTAL | Status: DC | PRN

## 2019-11-10 MED ORDER — SODIUM CHLORIDE 0.9 % IV SOLN: INTRAVENOUS | Status: DC

## 2019-11-10 MED ORDER — INSULIN GLARGINE 100 UNIT/ML ~~LOC~~ SOLN
16.0000 [IU] | Freq: Every day | SUBCUTANEOUS | Status: DC
  Administered 2019-11-10 – 2019-11-12 (×4): 16 [IU] via SUBCUTANEOUS
  Filled 2019-11-10 (×5): qty 0.16

## 2019-11-10 MED ORDER — PANTOPRAZOLE SODIUM 40 MG PO TBEC
40.0000 mg | DELAYED_RELEASE_TABLET | Freq: Every day | ORAL | Status: DC
  Administered 2019-11-10 – 2019-11-13 (×4): 40 mg via ORAL
  Filled 2019-11-10 (×4): qty 1

## 2019-11-10 MED ORDER — ONDANSETRON HCL 4 MG PO TABS
4.0000 mg | ORAL_TABLET | Freq: Four times a day (QID) | ORAL | Status: DC | PRN
  Administered 2019-11-10: 4 mg via ORAL
  Filled 2019-11-10 (×2): qty 1

## 2019-11-10 MED ORDER — AMLODIPINE BESYLATE 5 MG PO TABS
10.0000 mg | ORAL_TABLET | Freq: Every day | ORAL | Status: DC
  Administered 2019-11-11 – 2019-11-13 (×3): 10 mg via ORAL
  Filled 2019-11-10 (×4): qty 2

## 2019-11-10 MED ORDER — ENALAPRIL MALEATE 10 MG PO TABS: 20.0000 mg | ORAL_TABLET | Freq: Two times a day (BID) | ORAL | Status: DC

## 2019-11-10 MED ORDER — FENTANYL CITRATE (PF) 100 MCG/2ML IJ SOLN
25.0000 ug | INTRAMUSCULAR | Status: DC | PRN
  Administered 2019-11-10: 25 ug via INTRAVENOUS
  Filled 2019-11-10: qty 2

## 2019-11-10 MED ORDER — CLOPIDOGREL BISULFATE 75 MG PO TABS
75.0000 mg | ORAL_TABLET | Freq: Every day | ORAL | Status: DC
  Administered 2019-11-10 – 2019-11-13 (×4): 75 mg via ORAL
  Filled 2019-11-10 (×4): qty 1

## 2019-11-10 MED ORDER — DOCUSATE SODIUM 100 MG PO CAPS
200.0000 mg | ORAL_CAPSULE | Freq: Two times a day (BID) | ORAL | Status: DC
  Administered 2019-11-10 – 2019-11-13 (×5): 200 mg via ORAL
  Filled 2019-11-10 (×7): qty 2

## 2019-11-10 MED ORDER — TRAZODONE HCL 50 MG PO TABS: 25.0000 mg | ORAL_TABLET | Freq: Every evening | ORAL | Status: DC | PRN

## 2019-11-10 MED ORDER — KETOROLAC TROMETHAMINE 15 MG/ML IJ SOLN
15.0000 mg | Freq: Four times a day (QID) | INTRAMUSCULAR | Status: DC | PRN
  Administered 2019-11-10 – 2019-11-12 (×6): 15 mg via INTRAVENOUS
  Filled 2019-11-10 (×6): qty 1

## 2019-11-10 NOTE — ED Notes (Signed)
Patient given water at this time.  

## 2019-11-10 NOTE — Progress Notes (Signed)
OT Cancellation Note  Patient Details Name: Susan House MRN: [id number] DOB: [birth year]   Cancelled Treatment:    Reason Eval/Treat Not Completed: Other (comment). Consult received, chart reviewed. Pt pending CAM boot delivery. Will hold and re-attempt once pt received CAM boot for ADL mobility.   Jeni Salles, MPH, MS, OTR/L ascom [phone] 11/10/19, 3:06 PM

## 2019-11-10 NOTE — H&P (Signed)
[facility] [employer] NAME: Susan House    MR#:  [id number]  DATE OF BIRTH:  05/04/1956  DATE OF ADMISSION:  11/09/2019  PRIMARY CARE PHYSICIAN: Jerrol Banana., MD   REQUESTING/REFERRING PHYSICIAN: Hinda Kehr, MD  CHIEF COMPLAINT:   Chief Complaint  Patient presents with  . Fall    HISTORY OF PRESENT ILLNESS:  Susan House  is a 64 y.o. obese Caucasian female with a known history of multiple medical problems that are mentioned below, including hypertension, dyslipidemia, peripheral vascular disease, CVA and migraine, who presented to the emergency room with acute onset of accidental mechanical fall.  The patient slipped and fell without presyncope or syncope or chest pain or palpitations, paresthesias or focal muscle weakness.  She had subsequent left periorbital and facial contusion, left wrist injury and pain with subsequent left ulnar styloid fracture, right big toe swelling and pain with subsequent fracture as well as left-sided chest wall pain with associated 7 rib fractures as mentioned below.  Upon presentation to the emergency room, blood pressure was 161/80 with otherwise normal vital signs.  Labs revealed blood glucose of 302 and BUN of 26 with a creatinine of 1.36 and CBC showed leukocytosis of 12.8 with neutrophilia and mild anemia.  Influenza antigens and COVID-19 PCR came back negative.  Urinalysis showed more than 300 protein and 500 glucose. -Chest x-ray showed nondisplaced lateral 6 and 7 left rib fractures. -Chest CTA revealed acute nondisplaced third through ninth lateral left rib fractures and stable small to moderate sized posterior pericardial effusion and marked severity coronary artery calcification. -Left wrist x-ray showed small fracture of the left ulnar styloid and additional findings suspicious for nondisplaced fracture of the second left metacarpal. -Right foot x-ray revealed nondisplaced fracture of distal  phalanx of the right great toe.  The patient was given 10 mg of p.o. Roxicodone, 25 mcg of IV fentanyl, 4 mg of IV Zofran and 1 g of p.o. Tylenol.  She will be placed in observation in a medically monitored bed for further evaluation and management.  PAST MEDICAL HISTORY:   Past Medical History:  Diagnosis Date  . Allergy   . Arthritis   . Atypical chest pain    a. Reportedly nl cath in the past; b. 12/2009 MV: No ischemia/infarct. EF 87%.  . Carotid arterial disease ([place])    a. 10/9199 CTA head/neck: LICA 00F, RICA 12-->RFX rx.  . Cellulitis and abscess of face   . Chronic Dizziness   . Coronary artery calcification seen on CT scan    a. 10/2016 CTA chest: Atherosclerotic calcifications Ao, cors, and prox great vessels.  . Depression   . Diabetes mellitus ([place])   . Diastolic dysfunction    a. 2008 Echo: Nl EF; b. 2011 Echo: Nl EF; c. 06/2016 Echo: EF 55-60%, Gr1DD; d. 03/2018 Echo: EF 55-60%, no rwma, Gr1DD, mod dil LA.  . Edema   . GERD (gastroesophageal reflux disease)   . Headache   . Hematuria   . Hyperlipidemia   . Hypertension   . IBS (irritable bowel syndrome)   . Migraine   . Morbid obesity ([place])   . Nocturia   . Possible Seizure ([place])    a. 10/2016 ? sz. MRI neg for stroke.  . Stroke ([place]) 2000  . TIA (transient ischemic attack) 2010 & 2019   a. 2010; b. 03/2018 Left sided wkns-->MRI neg for stroke. CTA head/neck: Right A2 cut off/occlusion w/ collats extending  over R cerebral convexity. LICA 80D, RICA 45, RSubclavian 50->med rx.  . Urgency of micturation   . Urinary frequency   . Urinary incontinence     PAST SURGICAL HISTORY:   Past Surgical History:  Procedure Laterality Date  . ABDOMINAL HYSTERECTOMY    . ABDOMINOPLASTY    . APPENDECTOMY    . BACK SURGERY     extensive spinal fusion  . carpel tunnel surgery    . CATARACT EXTRACTION     x 2  . CESAREAN SECTION    . CHOLECYSTECTOMY    . COLONOSCOPY  2010  . COLONOSCOPY WITH PROPOFOL N/A 07/11/2016    Procedure: COLONOSCOPY WITH PROPOFOL;  Surgeon: Lucilla Lame, MD;  Location: [facility] ENDOSCOPY;  Service: Endoscopy;  Laterality: N/A;  . COLONOSCOPY WITH PROPOFOL N/A 03/26/2018   Procedure: COLONOSCOPY WITH PROPOFOL;  Surgeon: Lucilla Lame, MD;  Location[facility] ENDOSCOPY;  Service: Endoscopy;  Laterality: N/A;  . ESOPHAGOGASTRODUODENOSCOPY (EGD) WITH PROPOFOL N/A 03/26/2018   Procedure: ESOPHAGOGASTRODUODENOSCOPY (EGD) WITH PROPOFOL;  Surgeon: Lucilla Lame, MD;  Location: [facility] ENDOSCOPY;  Service: Endoscopy;  Laterality: N/A;  . EYE SURGERY    . HERNIA REPAIR    . SHOULDER SURGERY    . TOE SURGERY    . UPPER GI ENDOSCOPY    . WRIST FRACTURE SURGERY Left     SOCIAL HISTORY:   Social History   Tobacco Use  . Smoking status: Never Smoker  . Smokeless tobacco: Never Used  Substance Use Topics  . Alcohol use: No    FAMILY HISTORY:   Family History  Problem Relation Age of Onset  . Hyperlipidemia Mother   . Arrhythmia Mother        WPW  . Hypertension Mother   . Rheum arthritis Mother   . Heart disease Mother   . Heart attack Father 36  . Hyperlipidemia Father   . Hypertension Father   . Stroke Father   . Diabetes Father   . Heart disease Father   . Coronary artery disease Father   . Diabetes Sister   . Hyperlipidemia Sister   . Hypertension Sister   . Depression Sister   . Depression Sister   . Diabetes Sister   . Hypertension Sister   . Hyperlipidemia Sister   . Kidney disease Paternal Grandmother     DRUG ALLERGIES:   Allergies  Allergen Reactions  . Morphine And Related Anaphylaxis  . Reglan [Metoclopramide]     Other reaction(s): Unknown Elevated BP  . Simvastatin Other (See Comments)    Muscle pain  . Betadine [Povidone Iodine] Rash  . Tetracyclines & Related Rash    Other reaction(s): Unknown    REVIEW OF SYSTEMS:   ROS As per history of present illness. All pertinent systems were reviewed above. Constitutional,  HEENT, cardiovascular, respiratory,  GI, GU, musculoskeletal, neuro, psychiatric, endocrine,  integumentary and hematologic systems were reviewed and are otherwise  negative/unremarkable except for positive findings mentioned above in the HPI.   MEDICATIONS AT HOME:   Prior to Admission medications   Medication Sig Start Date End Date Taking? Authorizing Provider  ACCU-CHEK AVIVA PLUS test strip TEST BLOOD SUGAR THREE  TIMES DAILY 08/28/19   Jerrol Banana., MD  ACCU-CHEK SOFTCLIX LANCETS lancets CHECK BLOOD SUGAR THREE TIMES DAILY 04/24/16   Jerrol Banana., MD  amLODipine (NORVASC) 10 MG tablet TAKE 1 TABLET BY MOUTH  DAILY 05/30/19   Jerrol Banana., MD  amoxicillin-clavulanate (AUGMENTIN) 875-125 MG tablet Take 1  tablet by mouth 2 (two) times daily. Patient not taking: Reported on 10/03/2019 07/30/19   Flinchum, Kelby Aline, FNP  aspirin 81 MG chewable tablet Chew 1 tablet (81 mg total) by mouth daily. 09/27/18   Dustin Flock, MD  atorvastatin (LIPITOR) 20 MG tablet TAKE 1 TABLET BY MOUTH  DAILY 06/20/19   Jerrol Banana., MD  Blood Glucose Monitoring Suppl (ACCU-CHEK AVIVA PLUS) w/Device KIT Check sugar three times daily DX E11.9-needs a meter 11/23/15   Jerrol Banana., MD  ciprofloxacin (CIPRO) 250 MG tablet Take 1 tablet (250 mg total) by mouth 2 (two) times daily. 10/31/19   Jerrol Banana., MD  cloNIDine (CATAPRES) 0.3 MG tablet Take 1 tablet (0.3 mg total) by mouth 2 (two) times daily. 03/18/19   Jerrol Banana., MD  clopidogrel (PLAVIX) 75 MG tablet TAKE 1 TABLET BY MOUTH  DAILY 08/20/19   Jerrol Banana., MD  Continuous Blood Gluc Receiver (FREESTYLE LIBRE 14 DAY READER) DEVI LENGTH OF NEED: LIFETIME - UNLESS SPECIFIED OTHERWISE 06/27/18   Jerrol Banana., MD  Continuous Blood Gluc Sensor (FREESTYLE LIBRE 14 DAY SENSOR) MISC LENGTH OF NEED: LIFETIME - UNLESS SPECIFIED OTHERWISE 06/27/18   Jerrol Banana., MD  enalapril (VASOTEC) 20 MG tablet Take 1 tablet (20  mg total) by mouth 2 (two) times daily. 07/28/19   Jerrol Banana., MD  estradiol (ESTRACE) 0.1 MG/GM vaginal cream APPLY A SMALL AMOUNT TOPICALLY TO URETHRA AREA AT BEDTIME DAILY FOR 1 WEEK THEN THREE TIMES A WEEK FOR 3 WEEKS 07/28/19   [provider]  gabapentin (NEURONTIN) 100 MG capsule Take 200 mg by mouth 3 (three) times daily. 10/14/18   [provider]  hydrALAZINE (APRESOLINE) 100 MG tablet Take 1 tablet (100 mg total) by mouth 2 (two) times daily. 07/28/19   Jerrol Banana., MD  HYDROcodone-acetaminophen [facility]) 10-325 MG tablet Take 1 tablet by mouth every 6 (six) hours as needed. 06/20/19   Jerrol Banana., MD  insulin degludec (TRESIBA FLEXTOUCH) 100 UNIT/ML SOPN FlexTouch Pen ADMINISTER UP TO 30 UNITS UNDER THE SKIN DAILY 09/17/19   Jerrol Banana., MD  Lancet Devices [facility]) lancets Check sugar three times daily DX E11.9 11/23/15   Jerrol Banana., MD  levETIRAcetam (KEPPRA) 500 MG tablet TAKE 1 TABLET BY MOUTH 2  TIMES DAILY 09/15/19   Jerrol Banana., MD  metFORMIN (GLUCOPHAGE) 1000 MG tablet TAKE 1 TABLET BY MOUTH TWO  TIMES DAILY 09/14/19   Jerrol Banana., MD  metoprolol succinate (TOPROL-XL) 100 MG 24 hr tablet TAKE 1 TABLET BY MOUTH  DAILY 09/13/19   Jerrol Banana., MD  nitrofurantoin, macrocrystal-monohydrate, (MACROBID) 100 MG capsule Take 1 capsule (100 mg total) by mouth 2 (two) times daily. 08/15/19   Jerrol Banana., MD  nitroGLYCERIN (NITROSTAT) 0.4 MG SL tablet Place 1 tablet (0.4 mg total) under the tongue every 5 (five) minutes as needed for chest pain. 10/11/15   Jerrol Banana., MD  ondansetron (ZOFRAN) 4 MG tablet Take 1 tablet (4 mg total) by mouth every 8 (eight) hours as needed for nausea or vomiting. 06/09/19   Chrismon, Vickki Muff, PA  oxybutynin (DITROPAN) 5 MG tablet Take 1 tablet (5 mg total) by mouth 2 (two) times daily. 07/31/17   Jerrol Banana., MD  pantoprazole  (PROTONIX) 40 MG tablet TAKE 1 TABLET BY MOUTH  DAILY 03/31/19  Jerrol Banana., MD  traMADol-acetaminophen (ULTRACET) 37.5-325 MG tablet Take 1 tablet by mouth every 6 (six) hours as needed. 09/26/18   Dustin Flock, MD      VITAL SIGNS:  Blood pressure 137/64, pulse (!) 57, temperature 97.7 F (36.5 C), temperature source Oral, resp. rate 13, height 5' 1"  (1.549 m), weight 104.3 kg, SpO2 94 %.  PHYSICAL EXAMINATION:  Physical Exam  GENERAL:  64 y.o.-year-old obese Caucasian female patient lying in the bed with no acute distress.  EYES: Pupils equal, round, reactive to light and accommodation. No scleral icterus. Extraocular muscles intact.  HEENT: Head atraumatic, normocephalic. Oropharynx and nasopharynx clear.  NECK:  Supple, no jugular venous distention. No thyroid enlargement, no tenderness.  LUNGS: Normal breath sounds bilaterally, no wheezing, rales,rhonchi or crepitation. No use of accessory muscles of respiration.  CARDIOVASCULAR: Regular rate and rhythm, S1, S2 normal. No murmurs, rubs, or gallops.  ABDOMEN: Soft, nondistended, nontender. Bowel sounds present. No organomegaly or mass.  EXTREMITIES: No pedal edema, cyanosis, or clubbing.  NEUROLOGIC: Cranial nerves II through XII are intact. Muscle strength 5/5 in all extremities. Sensation intact. Gait not checked. Musculoskeletal: -Left-sided chest wall tenderness from 3rd-9th ribs laterally. -Left wrist is in short splint -Right big toe contusion with bluish discoloration and tenderness PSYCHIATRIC: The patient is alert and oriented x 3.  Normal affect and good eye contact. SKIN: No obvious rash, lesion, or ulcer.   LABORATORY PANEL:   CBC Recent Labs  Lab 11/10/19 0230  WBC 12.8*  HGB 11.5*  HCT 34.7*  PLT 247   ------------------------------------------------------------------------------------------------------------------  Chemistries  Recent Labs  Lab 11/10/19 0230  NA 136  K 4.4  CL 104  CO2  21*  GLUCOSE 302*  BUN 26*  CREATININE 1.36*  CALCIUM 8.5*   ------------------------------------------------------------------------------------------------------------------  Cardiac Enzymes No results for input(s): TROPONINI in the last 168 hours. ------------------------------------------------------------------------------------------------------------------  RADIOLOGY:  DG Ribs Unilateral W/Chest Left  Result Date: 11/10/2019 CLINICAL DATA:  Status post fall. EXAM: LEFT RIBS AND CHEST - 3+ VIEW COMPARISON:  Nov 29, 2018 FINDINGS: A nondisplaced lateral sixth and seventh left rib fracture is seen. Chronic deformities are seen involving the proximal left humerus and distal aspect of the right clavicle. There is no evidence of pneumothorax or pleural effusion. Both lungs are clear. Heart size and mediastinal contours are within normal limits. There is mild calcification of the aortic arch. Radiopaque surgical clips are seen within the right upper quadrant. IMPRESSION: Nondisplaced lateral sixth and seventh left rib fractures. Electronically Signed   By: Virgina Norfolk M.D.   On: 11/10/2019 00:27   DG Wrist Complete Left  Result Date: 11/10/2019 CLINICAL DATA:  Status post fall. EXAM: LEFT WRIST - COMPLETE 3+ VIEW COMPARISON:  February 25, 2007 FINDINGS: A small fracture of the left ulnar styloid is seen. A chronic fracture deformity is seen involving the distal left radius. This is present on the prior study. An ill-defined area of cortical irregularity is seen involving the proximal aspect of the second left metacarpal. Mild to moderate severity diffuse soft tissue swelling is seen. IMPRESSION: 1. Small fracture of the left ulnar styloid. 2. Additional findings suspicious for a nondisplaced fracture of the second left metacarpal. Electronically Signed   By: Virgina Norfolk M.D.   On: 11/10/2019 00:31   CT Head Wo Contrast  Result Date: 11/10/2019 CLINICAL DATA:  Fall after hitting her  left side of the face EXAM: CT HEAD WITHOUT CONTRAST TECHNIQUE: Contiguous axial images were obtained from  the base of the skull through the vertex without intravenous contrast. COMPARISON:  September 25, 2018 FINDINGS: Brain: No evidence of acute territorial infarction, hemorrhage, hydrocephalus,extra-axial collection or mass lesion/mass effect. There is dilatation the ventricles and sulci consistent with age-related atrophy. Low-attenuation changes in the deep white matter consistent with small vessel ischemia. Again noted is a prior infarct involving the left frontal lobe with ex vacuo dilatation of the left lateral ventricle. Prior lacunar infarcts cervical within the bilateral basal ganglia. Vascular: No hyperdense vessel or unexpected calcification. Skull: The skull is intact. No fracture or focal lesion identified. Sinuses/Orbits: The visualized paranasal sinuses and mastoid air cells are clear. The orbits and globes intact. Other: There is a small soft tissue hematoma seen overlying the left frontal skull and periorbital region. Face: Osseous: No acute fracture or other significant osseous abnormality.The nasal bone, mandibles, zygomatic arches and pterygoid plates are intact. Orbits: No fracture identified. Unremarkable appearance of globes and orbits. No retro-orbital collections are seen. Sinuses: The visualized paranasal sinuses and mastoid air cells are unremarkable. Soft tissues: Small soft tissue hematoma seen overlying the left frontal skull and periorbital region. Limited intracranial: No acute findings. Cervical spine: Alignment: There is straightening of the normal cervical lordosis. Skull base and vertebrae: Visualized skull base is intact. No atlanto-occipital dissociation. The vertebral body heights are well maintained. No fracture or pathologic osseous lesion seen. Soft tissues and spinal canal: The visualized paraspinal soft tissues are unremarkable. No prevertebral soft tissue swelling is seen.  The spinal canal is grossly unremarkable, no large epidural collection or significant canal narrowing. Disc levels: Disc height loss with uncovertebral osteophytes and disc osteophyte complex is most notable at C5-C6 and C6-C7 with mild neural foraminal and canal narrowing. Upper chest: The lung apices are clear. Thoracic inlet is within normal limits. Other: Scattered carotid artery calcifications are seen. Aortic atherosclerosis is noted. For which IMPRESSION: No acute intracranial abnormality. Findings consistent with age related atrophy and chronic small vessel ischemia Prior lacunar infarcts as described above Soft tissue hematoma and swelling over the left frontal skull and periorbital region No acute facial fracture No acute fracture or malalignment of the spine. Electronically Signed   By: Prudencio Pair M.D.   On: 11/10/2019 00:07   CT Chest Wo Contrast  Result Date: 11/10/2019 CLINICAL DATA:  Status post trauma. EXAM: CT CHEST WITHOUT CONTRAST TECHNIQUE: Multidetector CT imaging of the chest was performed following the standard protocol without IV contrast. COMPARISON:  Nov 29, 2018 FINDINGS: Cardiovascular: There is marked severity calcification of the thoracic aorta. Normal heart size. A stable small to moderate sized posterior pericardial effusion is seen. There is marked severity coronary artery calcification. Mediastinum/Nodes: No enlarged mediastinal or axillary lymph nodes. Thyroid gland, trachea, and esophagus demonstrate no significant findings. Lungs/Pleura: Lungs are clear. No pleural effusion or pneumothorax. Upper Abdomen: Multiple surgical clips are seen within the gallbladder fossa. Musculoskeletal: Acute, nondisplaced third through ninth lateral left rib fractures are seen. A chronic deformity is seen along the distal aspect of the right clavicle. IMPRESSION: 1. Acute, nondisplaced third through ninth lateral left rib fractures. 2. Stable small to moderate sized posterior pericardial  effusion. 3. Marked severity coronary artery calcification. Aortic Atherosclerosis (ICD10-I70.0). Electronically Signed   By: Virgina Norfolk M.D.   On: 11/10/2019 02:03   CT Cervical Spine Wo Contrast  Result Date: 11/10/2019 CLINICAL DATA:  Fall after hitting her left side of the face EXAM: CT HEAD WITHOUT CONTRAST TECHNIQUE: Contiguous axial images were obtained from the  base of the skull through the vertex without intravenous contrast. COMPARISON:  September 25, 2018 FINDINGS: Brain: No evidence of acute territorial infarction, hemorrhage, hydrocephalus,extra-axial collection or mass lesion/mass effect. There is dilatation the ventricles and sulci consistent with age-related atrophy. Low-attenuation changes in the deep white matter consistent with small vessel ischemia. Again noted is a prior infarct involving the left frontal lobe with ex vacuo dilatation of the left lateral ventricle. Prior lacunar infarcts cervical within the bilateral basal ganglia. Vascular: No hyperdense vessel or unexpected calcification. Skull: The skull is intact. No fracture or focal lesion identified. Sinuses/Orbits: The visualized paranasal sinuses and mastoid air cells are clear. The orbits and globes intact. Other: There is a small soft tissue hematoma seen overlying the left frontal skull and periorbital region. Face: Osseous: No acute fracture or other significant osseous abnormality.The nasal bone, mandibles, zygomatic arches and pterygoid plates are intact. Orbits: No fracture identified. Unremarkable appearance of globes and orbits. No retro-orbital collections are seen. Sinuses: The visualized paranasal sinuses and mastoid air cells are unremarkable. Soft tissues: Small soft tissue hematoma seen overlying the left frontal skull and periorbital region. Limited intracranial: No acute findings. Cervical spine: Alignment: There is straightening of the normal cervical lordosis. Skull base and vertebrae: Visualized skull base is  intact. No atlanto-occipital dissociation. The vertebral body heights are well maintained. No fracture or pathologic osseous lesion seen. Soft tissues and spinal canal: The visualized paraspinal soft tissues are unremarkable. No prevertebral soft tissue swelling is seen. The spinal canal is grossly unremarkable, no large epidural collection or significant canal narrowing. Disc levels: Disc height loss with uncovertebral osteophytes and disc osteophyte complex is most notable at C5-C6 and C6-C7 with mild neural foraminal and canal narrowing. Upper chest: The lung apices are clear. Thoracic inlet is within normal limits. Other: Scattered carotid artery calcifications are seen. Aortic atherosclerosis is noted. For which IMPRESSION: No acute intracranial abnormality. Findings consistent with age related atrophy and chronic small vessel ischemia Prior lacunar infarcts as described above Soft tissue hematoma and swelling over the left frontal skull and periorbital region No acute facial fracture No acute fracture or malalignment of the spine. Electronically Signed   By: Prudencio Pair M.D.   On: 11/10/2019 00:07   DG Toe Great Right  Result Date: 11/10/2019 CLINICAL DATA:  Status post fall. EXAM: RIGHT GREAT TOE COMPARISON:  None. FINDINGS: A nondisplaced fracture deformity is seen extending through the distal phalanx of the right great toe. There is no evidence of dislocation. There is no evidence of arthropathy or other focal bone abnormality. Mild diffuse soft tissue swelling is seen. IMPRESSION: Nondisplaced fracture of the distal phalanx of the right great toe. Electronically Signed   By: Virgina Norfolk M.D.   On: 11/10/2019 00:24   CT Maxillofacial Wo Contrast  Result Date: 11/10/2019 CLINICAL DATA:  Fall after hitting her left side of the face EXAM: CT HEAD WITHOUT CONTRAST TECHNIQUE: Contiguous axial images were obtained from the base of the skull through the vertex without intravenous contrast.  COMPARISON:  September 25, 2018 FINDINGS: Brain: No evidence of acute territorial infarction, hemorrhage, hydrocephalus,extra-axial collection or mass lesion/mass effect. There is dilatation the ventricles and sulci consistent with age-related atrophy. Low-attenuation changes in the deep white matter consistent with small vessel ischemia. Again noted is a prior infarct involving the left frontal lobe with ex vacuo dilatation of the left lateral ventricle. Prior lacunar infarcts cervical within the bilateral basal ganglia. Vascular: No hyperdense vessel or unexpected calcification. Skull: The  skull is intact. No fracture or focal lesion identified. Sinuses/Orbits: The visualized paranasal sinuses and mastoid air cells are clear. The orbits and globes intact. Other: There is a small soft tissue hematoma seen overlying the left frontal skull and periorbital region. Face: Osseous: No acute fracture or other significant osseous abnormality.The nasal bone, mandibles, zygomatic arches and pterygoid plates are intact. Orbits: No fracture identified. Unremarkable appearance of globes and orbits. No retro-orbital collections are seen. Sinuses: The visualized paranasal sinuses and mastoid air cells are unremarkable. Soft tissues: Small soft tissue hematoma seen overlying the left frontal skull and periorbital region. Limited intracranial: No acute findings. Cervical spine: Alignment: There is straightening of the normal cervical lordosis. Skull base and vertebrae: Visualized skull base is intact. No atlanto-occipital dissociation. The vertebral body heights are well maintained. No fracture or pathologic osseous lesion seen. Soft tissues and spinal canal: The visualized paraspinal soft tissues are unremarkable. No prevertebral soft tissue swelling is seen. The spinal canal is grossly unremarkable, no large epidural collection or significant canal narrowing. Disc levels: Disc height loss with uncovertebral osteophytes and disc  osteophyte complex is most notable at C5-C6 and C6-C7 with mild neural foraminal and canal narrowing. Upper chest: The lung apices are clear. Thoracic inlet is within normal limits. Other: Scattered carotid artery calcifications are seen. Aortic atherosclerosis is noted. For which IMPRESSION: No acute intracranial abnormality. Findings consistent with age related atrophy and chronic small vessel ischemia Prior lacunar infarcts as described above Soft tissue hematoma and swelling over the left frontal skull and periorbital region No acute facial fracture No acute fracture or malalignment of the spine. Electronically Signed   By: Prudencio Pair M.D.   On: 11/10/2019 00:07      IMPRESSION AND PLAN:   1.  Mechanical fall with subsequent multiple fractures including the left wrist, left third through ninth ribs and right big toe. -The patient will be admitted to an observation medical monitored bed. -Pain management will be provided with as needed IV Toradol and IV fentanyl. -Orthopedic consultation will be obtained. -I notified Dr. Harlow Mares about the patient. -Physical therapy consult will be obtained to assess ambulation.  2.  Hypertension. -We will continue amlodipine, clonidine, hydralazine, enalapril and Toprol-XL.  3.  Type 2 diabetes mellitus with peripheral neuropathy. -The patient will be placed on supplemental coverage with NovoLog. -We will hold off Metformin -We will continue Neurontin.  4.  Coronary artery disease. -We will continue aspirin Plavix, statin and beta-blocker therapy and as needed sublingual nitroglycerin.  5.  Overactive bladder. -We will continue Ditropan.  6.  DVT prophylaxis. -Subcutaneous Lovenox.    All the records are reviewed and case discussed with ED provider. The plan of care was discussed in details with the patient.  I answered all questions. The patient agreed to proceed with the above mentioned plan. Further management will depend upon hospital  course.   CODE STATUS: Full code  Status is: Observation  The patient remains OBS appropriate and will d/c before 2 midnights.  Dispo: The patient is from: Home              Anticipated d/c is to: Home              Anticipated d/c date is: 1 day              Patient currently is not medically stable to d/c.   TOTAL TIME TAKING CARE OF THIS PATIENT: 50 minutes.    Christel Mormon M.D on  11/10/2019 at 3:54 AM  [facility]   From 7 PM-7 AM, contact night-coverage [email or web address]  CC: Primary care physician; Jerrol Banana., MD   Note: This dictation was prepared with Dragon dictation along with smaller phrase technology. Any transcriptional errors that result from this process are unintentional.

## 2019-11-10 NOTE — ED Notes (Signed)
Pt assisted with repositioning for comfort due to left in position of discomfort following CT. PT assisted in moving to middle of bed, sliding up and HOB elevated with pillow placed under head. Pt states she is now much more comfortable. Tech, Beth at bedside for splint placement

## 2019-11-10 NOTE — Progress Notes (Signed)
PT Cancellation Note  Patient Details Name: Susan House MRN: [id number] DOB: 24-Mar-1956   Cancelled Treatment:    Reason Eval/Treat Not Completed: Patient not medically ready(Chart reviewed. Pt still pening orthopedic consultation for multiple fractures. Pt currently has no weightbearing status for wrist or foot. Will resume services once appropriate.)  9:53 AM, 11/10/19 Etta Grandchild, PT, DPT Physical Therapist - [facility]  [phone] ([place])   [place] [facility] 11/10/2019, 9:53 AM

## 2019-11-10 NOTE — ED Notes (Signed)
Pt to CT

## 2019-11-10 NOTE — Consult Note (Signed)
ORTHOPAEDIC CONSULTATION  REQUESTING PHYSICIAN: Wyvonnia Dusky, MD  Chief Complaint: left wrist, rib and right great toe pain  HPI: Susan House is a 64 y.o. female who complains of wrist, toe and rib pain. Please see H&P and ED notes for details. Denies any numbness, tingling or constitutional symptoms.  Past Medical History:  Diagnosis Date  . Allergy   . Arthritis   . Atypical chest pain    a. Reportedly nl cath in the past; b. 12/2009 MV: No ischemia/infarct. EF 87%.  . Carotid arterial disease ([place])    a. 10/5623 CTA head/neck: LICA 63S, RICA 93-->TDS rx.  . Cellulitis and abscess of face   . Chronic Dizziness   . Coronary artery calcification seen on CT scan    a. 10/2016 CTA chest: Atherosclerotic calcifications Ao, cors, and prox great vessels.  . Depression   . Diabetes mellitus ([place])   . Diastolic dysfunction    a. 2008 Echo: Nl EF; b. 2011 Echo: Nl EF; c. 06/2016 Echo: EF 55-60%, Gr1DD; d. 03/2018 Echo: EF 55-60%, no rwma, Gr1DD, mod dil LA.  . Edema   . GERD (gastroesophageal reflux disease)   . Headache   . Hematuria   . Hyperlipidemia   . Hypertension   . IBS (irritable bowel syndrome)   . Migraine   . Morbid obesity ([place])   . Nocturia   . Possible Seizure ([place])    a. 10/2016 ? sz. MRI neg for stroke.  . Stroke ([place]) 2000  . TIA (transient ischemic attack) 2010 & 2019   a. 2010; b. 03/2018 Left sided wkns-->MRI neg for stroke. CTA head/neck: Right A2 cut off/occlusion w/ collats extending over R cerebral convexity. LICA 28J, RICA 45, RSubclavian 50->med rx.  . Urgency of micturation   . Urinary frequency   . Urinary incontinence    Past Surgical History:  Procedure Laterality Date  . ABDOMINAL HYSTERECTOMY    . ABDOMINOPLASTY    . APPENDECTOMY    . BACK SURGERY     extensive spinal fusion  . carpel tunnel surgery    . CATARACT EXTRACTION     x 2  . CESAREAN SECTION    . CHOLECYSTECTOMY    . COLONOSCOPY  2010  . COLONOSCOPY WITH PROPOFOL  N/A 07/11/2016   Procedure: COLONOSCOPY WITH PROPOFOL;  Surgeon: Lucilla Lame, MD;  Location: [facility] ENDOSCOPY;  Service: Endoscopy;  Laterality: N/A;  . COLONOSCOPY WITH PROPOFOL N/A 03/26/2018   Procedure: COLONOSCOPY WITH PROPOFOL;  Surgeon: Lucilla Lame, MD;  Location: [facility] ENDOSCOPY;  Service: Endoscopy;  Laterality: N/A;  . ESOPHAGOGASTRODUODENOSCOPY (EGD) WITH PROPOFOL N/A 03/26/2018   Procedure: ESOPHAGOGASTRODUODENOSCOPY (EGD) WITH PROPOFOL;  Surgeon: Lucilla Lame, MD;  Location: [facility] ENDOSCOPY;  Service: Endoscopy;  Laterality: N/A;  . EYE SURGERY    . HERNIA REPAIR    . SHOULDER SURGERY    . TOE SURGERY    . UPPER GI ENDOSCOPY    . WRIST FRACTURE SURGERY Left    Social History   Socioeconomic History  . Marital status: Married    Spouse name: Marcello Moores   . Number of children: 2  . Years of education: 81  . Highest education level: Not on file  Occupational History  . Occupation: Disabled   . Occupation: retired  Tobacco Use  . Smoking status: Never Smoker  . Smokeless tobacco: Never Used  Substance and Sexual Activity  . Alcohol use: No  . Drug use: No  . Sexual activity: Never  Other Topics  Concern  . Not on file  Social History Narrative   Patient lives in [place] at home with husband Marcello Moores.    Patient has 2 children and 2 step children.    Patient has 12+ years of education.    Patient is Disabled since stroke in 2000.   Social Determinants of Health   Financial Resource Strain:   . Difficulty of Paying Living Expenses:   Food Insecurity:   . Worried About Charity fundraiser in the Last Year:   . Arboriculturist in the Last Year:   Transportation Needs:   . Film/video editor (Medical):   Marland Kitchen Lack of Transportation (Non-Medical):   Physical Activity:   . Days of Exercise per Week:   . Minutes of Exercise per Session:   Stress:   . Feeling of Stress :   Social Connections:   . Frequency of Communication with Friends and Family:   . Frequency of  Social Gatherings with Friends and Family:   . Attends Religious Services:   . Active Member of Clubs or Organizations:   . Attends Archivist Meetings:   Marland Kitchen Marital Status:    Family History  Problem Relation Age of Onset  . Hyperlipidemia Mother   . Arrhythmia Mother        WPW  . Hypertension Mother   . Rheum arthritis Mother   . Heart disease Mother   . Heart attack Father 10  . Hyperlipidemia Father   . Hypertension Father   . Stroke Father   . Diabetes Father   . Heart disease Father   . Coronary artery disease Father   . Diabetes Sister   . Hyperlipidemia Sister   . Hypertension Sister   . Depression Sister   . Depression Sister   . Diabetes Sister   . Hypertension Sister   . Hyperlipidemia Sister   . Kidney disease Paternal Grandmother    Allergies  Allergen Reactions  . Morphine And Related Anaphylaxis  . Reglan [Metoclopramide]     Other reaction(s): Unknown Elevated BP  . Simvastatin Other (See Comments)    Muscle pain  . Betadine [Povidone Iodine] Rash  . Tetracyclines & Related Rash    Other reaction(s): Unknown   Prior to Admission medications   Medication Sig Start Date End Date Taking? Authorizing Provider  ACCU-CHEK SOFTCLIX LANCETS lancets CHECK BLOOD SUGAR THREE TIMES DAILY 04/24/16  Yes Jerrol Banana., MD  amLODipine (NORVASC) 10 MG tablet TAKE 1 TABLET BY MOUTH  DAILY 05/30/19  Yes Jerrol Banana., MD  aspirin 81 MG chewable tablet Chew 1 tablet (81 mg total) by mouth daily. 09/27/18  Yes Dustin Flock, MD  atorvastatin (LIPITOR) 20 MG tablet TAKE 1 TABLET BY MOUTH  DAILY Patient taking differently: Take 20 mg by mouth at bedtime.  06/20/19  Yes Jerrol Banana., MD  cloNIDine (CATAPRES) 0.3 MG tablet Take 1 tablet (0.3 mg total) by mouth 2 (two) times daily. 03/18/19  Yes Jerrol Banana., MD  clopidogrel (PLAVIX) 75 MG tablet TAKE 1 TABLET BY MOUTH  DAILY 08/20/19  Yes Jerrol Banana., MD  enalapril  (VASOTEC) 20 MG tablet Take 1 tablet (20 mg total) by mouth 2 (two) times daily. 07/28/19  Yes Jerrol Banana., MD  gabapentin (NEURONTIN) 100 MG capsule Take 200 mg by mouth 2 (two) times daily.  10/14/18  Yes [provider]  hydrALAZINE (APRESOLINE) 100 MG tablet Take 1 tablet (  100 mg total) by mouth 2 (two) times daily. 07/28/19  Yes Jerrol Banana., MD  HYDROcodone-acetaminophen [facility]) 10-325 MG tablet Take 1 tablet by mouth every 6 (six) hours as needed. 06/20/19  Yes Jerrol Banana., MD  insulin degludec (TRESIBA FLEXTOUCH) 100 UNIT/ML SOPN FlexTouch Pen ADMINISTER UP TO 30 UNITS UNDER THE SKIN DAILY 09/17/19  Yes Jerrol Banana., MD  levETIRAcetam (KEPPRA) 500 MG tablet TAKE 1 TABLET BY MOUTH 2  TIMES DAILY 09/15/19  Yes Jerrol Banana., MD  metFORMIN (GLUCOPHAGE) 1000 MG tablet TAKE 1 TABLET BY MOUTH TWO  TIMES DAILY 09/14/19  Yes Jerrol Banana., MD  metoprolol succinate (TOPROL-XL) 100 MG 24 hr tablet TAKE 1 TABLET BY MOUTH  DAILY 09/13/19  Yes Jerrol Banana., MD  nitroGLYCERIN (NITROSTAT) 0.4 MG SL tablet Place 1 tablet (0.4 mg total) under the tongue every 5 (five) minutes as needed for chest pain. 10/11/15  Yes Jerrol Banana., MD  ondansetron (ZOFRAN) 4 MG tablet Take 1 tablet (4 mg total) by mouth every 8 (eight) hours as needed for nausea or vomiting. 06/09/19  Yes Chrismon, Vickki Muff, PA  oxybutynin (DITROPAN) 5 MG tablet Take 1 tablet (5 mg total) by mouth 2 (two) times daily. Patient taking differently: Take 5 mg by mouth 2 (two) times daily as needed.  07/31/17  Yes Jerrol Banana., MD  pantoprazole (PROTONIX) 40 MG tablet TAKE 1 TABLET BY MOUTH  DAILY 03/31/19  Yes Jerrol Banana., MD  ACCU-CHEK AVIVA PLUS test strip TEST BLOOD SUGAR THREE  TIMES DAILY 08/28/19   Jerrol Banana., MD  amoxicillin-clavulanate (AUGMENTIN) 875-125 MG tablet Take 1 tablet by mouth 2 (two) times daily. Patient not taking: Reported  on 11/10/2019 07/30/19   Doreen Beam, FNP  Blood Glucose Monitoring Suppl (ACCU-CHEK AVIVA PLUS) w/Device KIT Check sugar three times daily DX E11.9-needs a meter 11/23/15   Jerrol Banana., MD  ciprofloxacin (CIPRO) 250 MG tablet Take 1 tablet (250 mg total) by mouth 2 (two) times daily. Patient not taking: Reported on 11/10/2019 10/31/19   Jerrol Banana., MD  Continuous Blood Gluc Receiver (FREESTYLE LIBRE 14 DAY READER) DEVI LENGTH OF NEED: LIFETIME - UNLESS SPECIFIED OTHERWISE 06/27/18   Jerrol Banana., MD  Continuous Blood Gluc Sensor (FREESTYLE LIBRE 14 DAY SENSOR) MISC LENGTH OF NEED: LIFETIME - UNLESS SPECIFIED OTHERWISE 06/27/18   Jerrol Banana., MD  estradiol (ESTRACE) 0.1 MG/GM vaginal cream APPLY A SMALL AMOUNT TOPICALLY TO URETHRA AREA AT BEDTIME DAILY FOR 1 WEEK THEN THREE TIMES A WEEK FOR 3 WEEKS 07/28/19   [provider]  Lancet Devices [facility]) lancets Check sugar three times daily DX E11.9 11/23/15   Jerrol Banana., MD  nitrofurantoin, macrocrystal-monohydrate, (MACROBID) 100 MG capsule Take 1 capsule (100 mg total) by mouth 2 (two) times daily. Patient not taking: Reported on 11/10/2019 08/15/19   Jerrol Banana., MD  traMADol-acetaminophen (ULTRACET) 37.5-325 MG tablet Take 1 tablet by mouth every 6 (six) hours as needed. Patient not taking: Reported on 11/10/2019 09/26/18   Dustin Flock, MD   DG Ribs Unilateral W/Chest Left  Result Date: 11/10/2019 CLINICAL DATA:  Status post fall. EXAM: LEFT RIBS AND CHEST - 3+ VIEW COMPARISON:  Nov 29, 2018 FINDINGS: A nondisplaced lateral sixth and seventh left rib fracture is seen. Chronic deformities are seen involving the proximal left humerus and distal aspect  of the right clavicle. There is no evidence of pneumothorax or pleural effusion. Both lungs are clear. Heart size and mediastinal contours are within normal limits. There is mild calcification of the aortic arch.  Radiopaque surgical clips are seen within the right upper quadrant. IMPRESSION: Nondisplaced lateral sixth and seventh left rib fractures. Electronically Signed   By: Virgina Norfolk M.D.   On: 11/10/2019 00:27   DG Wrist Complete Left  Result Date: 11/10/2019 CLINICAL DATA:  Status post fall. EXAM: LEFT WRIST - COMPLETE 3+ VIEW COMPARISON:  February 25, 2007 FINDINGS: A small fracture of the left ulnar styloid is seen. A chronic fracture deformity is seen involving the distal left radius. This is present on the prior study. An ill-defined area of cortical irregularity is seen involving the proximal aspect of the second left metacarpal. Mild to moderate severity diffuse soft tissue swelling is seen. IMPRESSION: 1. Small fracture of the left ulnar styloid. 2. Additional findings suspicious for a nondisplaced fracture of the second left metacarpal. Electronically Signed   By: Virgina Norfolk M.D.   On: 11/10/2019 00:31   CT Head Wo Contrast  Result Date: 11/10/2019 CLINICAL DATA:  Fall after hitting her left side of the face EXAM: CT HEAD WITHOUT CONTRAST TECHNIQUE: Contiguous axial images were obtained from the base of the skull through the vertex without intravenous contrast. COMPARISON:  September 25, 2018 FINDINGS: Brain: No evidence of acute territorial infarction, hemorrhage, hydrocephalus,extra-axial collection or mass lesion/mass effect. There is dilatation the ventricles and sulci consistent with age-related atrophy. Low-attenuation changes in the deep white matter consistent with small vessel ischemia. Again noted is a prior infarct involving the left frontal lobe with ex vacuo dilatation of the left lateral ventricle. Prior lacunar infarcts cervical within the bilateral basal ganglia. Vascular: No hyperdense vessel or unexpected calcification. Skull: The skull is intact. No fracture or focal lesion identified. Sinuses/Orbits: The visualized paranasal sinuses and mastoid air cells are clear. The orbits  and globes intact. Other: There is a small soft tissue hematoma seen overlying the left frontal skull and periorbital region. Face: Osseous: No acute fracture or other significant osseous abnormality.The nasal bone, mandibles, zygomatic arches and pterygoid plates are intact. Orbits: No fracture identified. Unremarkable appearance of globes and orbits. No retro-orbital collections are seen. Sinuses: The visualized paranasal sinuses and mastoid air cells are unremarkable. Soft tissues: Small soft tissue hematoma seen overlying the left frontal skull and periorbital region. Limited intracranial: No acute findings. Cervical spine: Alignment: There is straightening of the normal cervical lordosis. Skull base and vertebrae: Visualized skull base is intact. No atlanto-occipital dissociation. The vertebral body heights are well maintained. No fracture or pathologic osseous lesion seen. Soft tissues and spinal canal: The visualized paraspinal soft tissues are unremarkable. No prevertebral soft tissue swelling is seen. The spinal canal is grossly unremarkable, no large epidural collection or significant canal narrowing. Disc levels: Disc height loss with uncovertebral osteophytes and disc osteophyte complex is most notable at C5-C6 and C6-C7 with mild neural foraminal and canal narrowing. Upper chest: The lung apices are clear. Thoracic inlet is within normal limits. Other: Scattered carotid artery calcifications are seen. Aortic atherosclerosis is noted. For which IMPRESSION: No acute intracranial abnormality. Findings consistent with age related atrophy and chronic small vessel ischemia Prior lacunar infarcts as described above Soft tissue hematoma and swelling over the left frontal skull and periorbital region No acute facial fracture No acute fracture or malalignment of the spine. Electronically Signed   By: Prudencio Pair  M.D.   On: 11/10/2019 00:07   CT Chest Wo Contrast  Result Date: 11/10/2019 CLINICAL DATA:   Status post trauma. EXAM: CT CHEST WITHOUT CONTRAST TECHNIQUE: Multidetector CT imaging of the chest was performed following the standard protocol without IV contrast. COMPARISON:  Nov 29, 2018 FINDINGS: Cardiovascular: There is marked severity calcification of the thoracic aorta. Normal heart size. A stable small to moderate sized posterior pericardial effusion is seen. There is marked severity coronary artery calcification. Mediastinum/Nodes: No enlarged mediastinal or axillary lymph nodes. Thyroid gland, trachea, and esophagus demonstrate no significant findings. Lungs/Pleura: Lungs are clear. No pleural effusion or pneumothorax. Upper Abdomen: Multiple surgical clips are seen within the gallbladder fossa. Musculoskeletal: Acute, nondisplaced third through ninth lateral left rib fractures are seen. A chronic deformity is seen along the distal aspect of the right clavicle. IMPRESSION: 1. Acute, nondisplaced third through ninth lateral left rib fractures. 2. Stable small to moderate sized posterior pericardial effusion. 3. Marked severity coronary artery calcification. Aortic Atherosclerosis (ICD10-I70.0). Electronically Signed   By: Virgina Norfolk M.D.   On: 11/10/2019 02:03   CT Cervical Spine Wo Contrast  Result Date: 11/10/2019 CLINICAL DATA:  Fall after hitting her left side of the face EXAM: CT HEAD WITHOUT CONTRAST TECHNIQUE: Contiguous axial images were obtained from the base of the skull through the vertex without intravenous contrast. COMPARISON:  September 25, 2018 FINDINGS: Brain: No evidence of acute territorial infarction, hemorrhage, hydrocephalus,extra-axial collection or mass lesion/mass effect. There is dilatation the ventricles and sulci consistent with age-related atrophy. Low-attenuation changes in the deep white matter consistent with small vessel ischemia. Again noted is a prior infarct involving the left frontal lobe with ex vacuo dilatation of the left lateral ventricle. Prior lacunar  infarcts cervical within the bilateral basal ganglia. Vascular: No hyperdense vessel or unexpected calcification. Skull: The skull is intact. No fracture or focal lesion identified. Sinuses/Orbits: The visualized paranasal sinuses and mastoid air cells are clear. The orbits and globes intact. Other: There is a small soft tissue hematoma seen overlying the left frontal skull and periorbital region. Face: Osseous: No acute fracture or other significant osseous abnormality.The nasal bone, mandibles, zygomatic arches and pterygoid plates are intact. Orbits: No fracture identified. Unremarkable appearance of globes and orbits. No retro-orbital collections are seen. Sinuses: The visualized paranasal sinuses and mastoid air cells are unremarkable. Soft tissues: Small soft tissue hematoma seen overlying the left frontal skull and periorbital region. Limited intracranial: No acute findings. Cervical spine: Alignment: There is straightening of the normal cervical lordosis. Skull base and vertebrae: Visualized skull base is intact. No atlanto-occipital dissociation. The vertebral body heights are well maintained. No fracture or pathologic osseous lesion seen. Soft tissues and spinal canal: The visualized paraspinal soft tissues are unremarkable. No prevertebral soft tissue swelling is seen. The spinal canal is grossly unremarkable, no large epidural collection or significant canal narrowing. Disc levels: Disc height loss with uncovertebral osteophytes and disc osteophyte complex is most notable at C5-C6 and C6-C7 with mild neural foraminal and canal narrowing. Upper chest: The lung apices are clear. Thoracic inlet is within normal limits. Other: Scattered carotid artery calcifications are seen. Aortic atherosclerosis is noted. For which IMPRESSION: No acute intracranial abnormality. Findings consistent with age related atrophy and chronic small vessel ischemia Prior lacunar infarcts as described above Soft tissue hematoma and  swelling over the left frontal skull and periorbital region No acute facial fracture No acute fracture or malalignment of the spine. Electronically Signed   By: Prudencio Pair  M.D.   On: 11/10/2019 00:07   DG Toe Great Right  Result Date: 11/10/2019 CLINICAL DATA:  Status post fall. EXAM: RIGHT GREAT TOE COMPARISON:  None. FINDINGS: A nondisplaced fracture deformity is seen extending through the distal phalanx of the right great toe. There is no evidence of dislocation. There is no evidence of arthropathy or other focal bone abnormality. Mild diffuse soft tissue swelling is seen. IMPRESSION: Nondisplaced fracture of the distal phalanx of the right great toe. Electronically Signed   By: Virgina Norfolk M.D.   On: 11/10/2019 00:24   CT Maxillofacial Wo Contrast  Result Date: 11/10/2019 CLINICAL DATA:  Fall after hitting her left side of the face EXAM: CT HEAD WITHOUT CONTRAST TECHNIQUE: Contiguous axial images were obtained from the base of the skull through the vertex without intravenous contrast. COMPARISON:  September 25, 2018 FINDINGS: Brain: No evidence of acute territorial infarction, hemorrhage, hydrocephalus,extra-axial collection or mass lesion/mass effect. There is dilatation the ventricles and sulci consistent with age-related atrophy. Low-attenuation changes in the deep white matter consistent with small vessel ischemia. Again noted is a prior infarct involving the left frontal lobe with ex vacuo dilatation of the left lateral ventricle. Prior lacunar infarcts cervical within the bilateral basal ganglia. Vascular: No hyperdense vessel or unexpected calcification. Skull: The skull is intact. No fracture or focal lesion identified. Sinuses/Orbits: The visualized paranasal sinuses and mastoid air cells are clear. The orbits and globes intact. Other: There is a small soft tissue hematoma seen overlying the left frontal skull and periorbital region. Face: Osseous: No acute fracture or other significant  osseous abnormality.The nasal bone, mandibles, zygomatic arches and pterygoid plates are intact. Orbits: No fracture identified. Unremarkable appearance of globes and orbits. No retro-orbital collections are seen. Sinuses: The visualized paranasal sinuses and mastoid air cells are unremarkable. Soft tissues: Small soft tissue hematoma seen overlying the left frontal skull and periorbital region. Limited intracranial: No acute findings. Cervical spine: Alignment: There is straightening of the normal cervical lordosis. Skull base and vertebrae: Visualized skull base is intact. No atlanto-occipital dissociation. The vertebral body heights are well maintained. No fracture or pathologic osseous lesion seen. Soft tissues and spinal canal: The visualized paraspinal soft tissues are unremarkable. No prevertebral soft tissue swelling is seen. The spinal canal is grossly unremarkable, no large epidural collection or significant canal narrowing. Disc levels: Disc height loss with uncovertebral osteophytes and disc osteophyte complex is most notable at C5-C6 and C6-C7 with mild neural foraminal and canal narrowing. Upper chest: The lung apices are clear. Thoracic inlet is within normal limits. Other: Scattered carotid artery calcifications are seen. Aortic atherosclerosis is noted. For which IMPRESSION: No acute intracranial abnormality. Findings consistent with age related atrophy and chronic small vessel ischemia Prior lacunar infarcts as described above Soft tissue hematoma and swelling over the left frontal skull and periorbital region No acute facial fracture No acute fracture or malalignment of the spine. Electronically Signed   By: Prudencio Pair M.D.   On: 11/10/2019 00:07    Positive ROS: All other systems have been reviewed and were otherwise negative with the exception of those mentioned in the HPI and as above.  Physical Exam: General: Alert, no acute distress Cardiovascular: No pedal edema Respiratory: No  cyanosis, no use of accessory musculature GI: No organomegaly, abdomen is soft and non-tender Skin: No lesions in the area of chief complaint Neurologic: Sensation intact distally Psychiatric: Patient is competent for consent with normal mood and affect Lymphatic: No axillary or cervical lymphadenopathy  MUSCULOSKELETAL: Left wrist tender along ulnar aspect. Compartments soft. Good cap refill. Motor and sensory intact distally. Right great toe with bruising, swelling tenderness. Compartments soft. Good cap refill. Motor and sensory intact distally.   Assessment: Right great toe fracture Left ulnar styloid fracture  Plan:  She may WBAT through her heel on the right leg. She is NWB with the left hand. She may use a platform walker through the left forearm.  She will follow-up with me in 5 to 7 seven days for repeat x-rays.  CAM walking boot ordered for her foot fracture.  Please call with questions.    Lovell Sheehan, MD    11/10/2019 2:32 PM

## 2019-11-10 NOTE — ED Notes (Signed)
Pt states she is experiencing severe nausea and asks for emesis bag. MD notified. Order to follow

## 2019-11-10 NOTE — ED Notes (Signed)
Pt to imaging

## 2019-11-10 NOTE — ED Notes (Signed)
Patient given sandwich tray at this time.

## 2019-11-10 NOTE — Progress Notes (Addendum)
PROGRESS NOTE    Susan House  YSA:[id number] DOB: [birth year] DOA: 11/09/2019 PCP: Jerrol Banana., MD      Assessment & Plan:   Active Problems:   Fall   Mechanical fall: w/subsequent multiple fractures including the left wrist, sixth & seventh ribs and right big toe. IV Toradol and IV fentanyl prn for pain. Ortho surg consulted. PT consulted   Hypertension: will continue amlodipine, clonidine, hydralazine, and metoprolol.  DM2: with peripheral neuropathy. Continue on lantus & SSI w/ accuchecks. Continue to hold metformin. Continue on gabapentin  CAD: will continue aspirin, plavix, statin and metoprolol and nitro prn   Overactive bladder: will continue Ditropan.  AKI vs CKD: baseline Cr/GFR is unknown. Cr is trending down today. Will continue to monitor   Generalized weakness: PT/OT consulted   DVT prophylaxis: lovenox Code Status: DNR Family Communication:  Disposition Plan: depends on PT/OT recs    Consultants:   Ortho surgery, Dr. Harlow Mares  Procedures:   Antimicrobials:    Subjective: Pt c/o her entire body being sore.   Objective: Vitals:   11/10/19 0208 11/10/19 0330 11/10/19 0445 11/10/19 0449  BP: 132/69 137/64  (!) 159/98  Pulse: 64 (!) 57  66  Resp: 17 13  16   Temp:    97.8 F (36.6 C)  TempSrc:    Oral  SpO2: 98% 94%  98%  Weight:   104.2 kg   Height:   5\' 2"  (1.575 m)    No intake or output data in the 24 hours ending 11/10/19 0739 Filed Weights   11/09/19 2317 11/10/19 0445  Weight: 104.3 kg 104.2 kg    Examination:  General exam: Appears calm and comfortable. Black left eye Respiratory system: Clear to auscultation. No rales, wheezes Cardiovascular system: S1 & S2+. No rubs, gallops or clicks.  Gastrointestinal system: Abdomen is obese, soft and nontender.  Normal bowel sounds heard. Central nervous system: Alert and oriented. Moves all 4 extremities Psychiatry: Judgement and insight appear normal. Mood & affect  appropriate.     Data Reviewed: I have personally reviewed following labs and imaging studies  CBC: Recent Labs  Lab 11/10/19 0230 11/10/19 0457  WBC 12.8* 11.9*  NEUTROABS 10.0*  --   HGB 11.5* 11.5*  HCT 34.7* 35.8*  MCV 85.7 87.5  PLT 247 323   Basic Metabolic Panel: Recent Labs  Lab 11/10/19 0230 11/10/19 0457  NA 136 136  K 4.4 4.4  CL 104 104  CO2 21* 21*  GLUCOSE 302* 311*  BUN 26* 28*  CREATININE 1.36* 1.24*  CALCIUM 8.5* 8.3*   GFR: Estimated Creatinine Clearance: 52.6 mL/min (A) (by C-G formula based on SCr of 1.24 mg/dL (H)). Liver Function Tests: No results for input(s): AST, ALT, ALKPHOS, BILITOT, PROT, ALBUMIN in the last 168 hours. No results for input(s): LIPASE, AMYLASE in the last 168 hours. No results for input(s): AMMONIA in the last 168 hours. Coagulation Profile: No results for input(s): INR, PROTIME in the last 168 hours. Cardiac Enzymes: No results for input(s): CKTOTAL, CKMB, CKMBINDEX, TROPONINI in the last 168 hours. BNP (last 3 results) No results for input(s): PROBNP in the last 8760 hours. HbA1C: No results for input(s): HGBA1C in the last 72 hours. CBG: Recent Labs  Lab 11/10/19 0546  GLUCAP 313*   Lipid Profile: No results for input(s): CHOL, HDL, LDLCALC, TRIG, CHOLHDL, LDLDIRECT in the last 72 hours. Thyroid Function Tests: No results for input(s): TSH, T4TOTAL, FREET4, T3FREE, THYROIDAB in the last 72  hours. Anemia Panel: No results for input(s): VITAMINB12, FOLATE, FERRITIN, TIBC, IRON, RETICCTPCT in the last 72 hours. Sepsis Labs: No results for input(s): PROCALCITON, LATICACIDVEN in the last 168 hours.  Recent Results (from the past 240 hour(s))  Respiratory Panel by RT PCR (Flu A&B, Covid) - Nasopharyngeal Swab     Status: None   Collection Time: 11/10/19  2:10 AM   Specimen: Nasopharyngeal Swab  Result Value Ref Range Status   SARS Coronavirus 2 by RT PCR NEGATIVE NEGATIVE Final    Comment: (NOTE) SARS-CoV-2  target nucleic acids are NOT DETECTED. The SARS-CoV-2 RNA is generally detectable in upper respiratoy specimens during the acute phase of infection. The lowest concentration of SARS-CoV-2 viral copies this assay can detect is 131 copies/mL. A negative result does not preclude SARS-Cov-2 infection and should not be used as the sole basis for treatment or other patient management decisions. A negative result may occur with  improper specimen collection/handling, submission of specimen other than nasopharyngeal swab, presence of viral mutation(s) within the areas targeted by this assay, and inadequate number of viral copies (<131 copies/mL). A negative result must be combined with clinical observations, patient history, and epidemiological information. The expected result is Negative. Fact Sheet for Patients:  PinkCheek.be Fact Sheet for Healthcare Providers:  GravelBags.it This test is not yet ap proved or cleared by the [place] FDA and  has been authorized for detection and/or diagnosis of SARS-CoV-2 by FDA under an Emergency Use Authorization (EUA). This EUA will remain  in effect (meaning this test can be used) for the duration of the COVID-19 declaration under Section 564(b)(1) of the Act, 21 U.S.C. section 360bbb-3(b)(1), unless the authorization is terminated or revoked sooner.    Influenza A by PCR NEGATIVE NEGATIVE Final   Influenza B by PCR NEGATIVE NEGATIVE Final    Comment: (NOTE) The Xpert Xpress SARS-CoV-2/FLU/RSV assay is intended as an aid in  the diagnosis of influenza from Nasopharyngeal swab specimens and  should not be used as a sole basis for treatment. Nasal washings and  aspirates are unacceptable for Xpert Xpress SARS-CoV-2/FLU/RSV  testing. Fact Sheet for Patients: PinkCheek.be Fact Sheet for Healthcare Providers: GravelBags.it This test is  not yet approved or cleared by the [place] FDA and  has been authorized for detection and/or diagnosis of SARS-CoV-2 by  FDA under an Emergency Use Authorization (EUA). This EUA will remain  in effect (meaning this test can be used) for the duration of the  Covid-19 declaration under Section 564(b)(1) of the Act, 21  U.S.C. section 360bbb-3(b)(1), unless the authorization is  terminated or revoked. Performed at [facility], [address], [place], [place] [address]          Radiology Studies: DG Ribs Unilateral W/Chest Left  Result Date: 11/10/2019 CLINICAL DATA:  Status post fall. EXAM: LEFT RIBS AND CHEST - 3+ VIEW COMPARISON:  Nov 29, 2018 FINDINGS: A nondisplaced lateral sixth and seventh left rib fracture is seen. Chronic deformities are seen involving the proximal left humerus and distal aspect of the right clavicle. There is no evidence of pneumothorax or pleural effusion. Both lungs are clear. Heart size and mediastinal contours are within normal limits. There is mild calcification of the aortic arch. Radiopaque surgical clips are seen within the right upper quadrant. IMPRESSION: Nondisplaced lateral sixth and seventh left rib fractures. Electronically Signed   By: Virgina Norfolk M.D.   On: 11/10/2019 00:27   DG Wrist Complete Left  Result Date: 11/10/2019 CLINICAL  DATA:  Status post fall. EXAM: LEFT WRIST - COMPLETE 3+ VIEW COMPARISON:  February 25, 2007 FINDINGS: A small fracture of the left ulnar styloid is seen. A chronic fracture deformity is seen involving the distal left radius. This is present on the prior study. An ill-defined area of cortical irregularity is seen involving the proximal aspect of the second left metacarpal. Mild to moderate severity diffuse soft tissue swelling is seen. IMPRESSION: 1. Small fracture of the left ulnar styloid. 2. Additional findings suspicious for a nondisplaced fracture of the second left metacarpal. Electronically Signed    By: Virgina Norfolk M.D.   On: 11/10/2019 00:31   CT Head Wo Contrast  Result Date: 11/10/2019 CLINICAL DATA:  Fall after hitting her left side of the face EXAM: CT HEAD WITHOUT CONTRAST TECHNIQUE: Contiguous axial images were obtained from the base of the skull through the vertex without intravenous contrast. COMPARISON:  September 25, 2018 FINDINGS: Brain: No evidence of acute territorial infarction, hemorrhage, hydrocephalus,extra-axial collection or mass lesion/mass effect. There is dilatation the ventricles and sulci consistent with age-related atrophy. Low-attenuation changes in the deep white matter consistent with small vessel ischemia. Again noted is a prior infarct involving the left frontal lobe with ex vacuo dilatation of the left lateral ventricle. Prior lacunar infarcts cervical within the bilateral basal ganglia. Vascular: No hyperdense vessel or unexpected calcification. Skull: The skull is intact. No fracture or focal lesion identified. Sinuses/Orbits: The visualized paranasal sinuses and mastoid air cells are clear. The orbits and globes intact. Other: There is a small soft tissue hematoma seen overlying the left frontal skull and periorbital region. Face: Osseous: No acute fracture or other significant osseous abnormality.The nasal bone, mandibles, zygomatic arches and pterygoid plates are intact. Orbits: No fracture identified. Unremarkable appearance of globes and orbits. No retro-orbital collections are seen. Sinuses: The visualized paranasal sinuses and mastoid air cells are unremarkable. Soft tissues: Small soft tissue hematoma seen overlying the left frontal skull and periorbital region. Limited intracranial: No acute findings. Cervical spine: Alignment: There is straightening of the normal cervical lordosis. Skull base and vertebrae: Visualized skull base is intact. No atlanto-occipital dissociation. The vertebral body heights are well maintained. No fracture or pathologic osseous lesion  seen. Soft tissues and spinal canal: The visualized paraspinal soft tissues are unremarkable. No prevertebral soft tissue swelling is seen. The spinal canal is grossly unremarkable, no large epidural collection or significant canal narrowing. Disc levels: Disc height loss with uncovertebral osteophytes and disc osteophyte complex is most notable at C5-C6 and C6-C7 with mild neural foraminal and canal narrowing. Upper chest: The lung apices are clear. Thoracic inlet is within normal limits. Other: Scattered carotid artery calcifications are seen. Aortic atherosclerosis is noted. For which IMPRESSION: No acute intracranial abnormality. Findings consistent with age related atrophy and chronic small vessel ischemia Prior lacunar infarcts as described above Soft tissue hematoma and swelling over the left frontal skull and periorbital region No acute facial fracture No acute fracture or malalignment of the spine. Electronically Signed   By: Prudencio Pair M.D.   On: 11/10/2019 00:07   CT Chest Wo Contrast  Result Date: 11/10/2019 CLINICAL DATA:  Status post trauma. EXAM: CT CHEST WITHOUT CONTRAST TECHNIQUE: Multidetector CT imaging of the chest was performed following the standard protocol without IV contrast. COMPARISON:  Nov 29, 2018 FINDINGS: Cardiovascular: There is marked severity calcification of the thoracic aorta. Normal heart size. A stable small to moderate sized posterior pericardial effusion is seen. There is marked severity coronary  artery calcification. Mediastinum/Nodes: No enlarged mediastinal or axillary lymph nodes. Thyroid gland, trachea, and esophagus demonstrate no significant findings. Lungs/Pleura: Lungs are clear. No pleural effusion or pneumothorax. Upper Abdomen: Multiple surgical clips are seen within the gallbladder fossa. Musculoskeletal: Acute, nondisplaced third through ninth lateral left rib fractures are seen. A chronic deformity is seen along the distal aspect of the right clavicle.  IMPRESSION: 1. Acute, nondisplaced third through ninth lateral left rib fractures. 2. Stable small to moderate sized posterior pericardial effusion. 3. Marked severity coronary artery calcification. Aortic Atherosclerosis (ICD10-I70.0). Electronically Signed   By: Virgina Norfolk M.D.   On: 11/10/2019 02:03   CT Cervical Spine Wo Contrast  Result Date: 11/10/2019 CLINICAL DATA:  Fall after hitting her left side of the face EXAM: CT HEAD WITHOUT CONTRAST TECHNIQUE: Contiguous axial images were obtained from the base of the skull through the vertex without intravenous contrast. COMPARISON:  September 25, 2018 FINDINGS: Brain: No evidence of acute territorial infarction, hemorrhage, hydrocephalus,extra-axial collection or mass lesion/mass effect. There is dilatation the ventricles and sulci consistent with age-related atrophy. Low-attenuation changes in the deep white matter consistent with small vessel ischemia. Again noted is a prior infarct involving the left frontal lobe with ex vacuo dilatation of the left lateral ventricle. Prior lacunar infarcts cervical within the bilateral basal ganglia. Vascular: No hyperdense vessel or unexpected calcification. Skull: The skull is intact. No fracture or focal lesion identified. Sinuses/Orbits: The visualized paranasal sinuses and mastoid air cells are clear. The orbits and globes intact. Other: There is a small soft tissue hematoma seen overlying the left frontal skull and periorbital region. Face: Osseous: No acute fracture or other significant osseous abnormality.The nasal bone, mandibles, zygomatic arches and pterygoid plates are intact. Orbits: No fracture identified. Unremarkable appearance of globes and orbits. No retro-orbital collections are seen. Sinuses: The visualized paranasal sinuses and mastoid air cells are unremarkable. Soft tissues: Small soft tissue hematoma seen overlying the left frontal skull and periorbital region. Limited intracranial: No acute  findings. Cervical spine: Alignment: There is straightening of the normal cervical lordosis. Skull base and vertebrae: Visualized skull base is intact. No atlanto-occipital dissociation. The vertebral body heights are well maintained. No fracture or pathologic osseous lesion seen. Soft tissues and spinal canal: The visualized paraspinal soft tissues are unremarkable. No prevertebral soft tissue swelling is seen. The spinal canal is grossly unremarkable, no large epidural collection or significant canal narrowing. Disc levels: Disc height loss with uncovertebral osteophytes and disc osteophyte complex is most notable at C5-C6 and C6-C7 with mild neural foraminal and canal narrowing. Upper chest: The lung apices are clear. Thoracic inlet is within normal limits. Other: Scattered carotid artery calcifications are seen. Aortic atherosclerosis is noted. For which IMPRESSION: No acute intracranial abnormality. Findings consistent with age related atrophy and chronic small vessel ischemia Prior lacunar infarcts as described above Soft tissue hematoma and swelling over the left frontal skull and periorbital region No acute facial fracture No acute fracture or malalignment of the spine. Electronically Signed   By: Prudencio Pair M.D.   On: 11/10/2019 00:07   DG Toe Great Right  Result Date: 11/10/2019 CLINICAL DATA:  Status post fall. EXAM: RIGHT GREAT TOE COMPARISON:  None. FINDINGS: A nondisplaced fracture deformity is seen extending through the distal phalanx of the right great toe. There is no evidence of dislocation. There is no evidence of arthropathy or other focal bone abnormality. Mild diffuse soft tissue swelling is seen. IMPRESSION: Nondisplaced fracture of the distal phalanx of the  right great toe. Electronically Signed   By: Virgina Norfolk M.D.   On: 11/10/2019 00:24   CT Maxillofacial Wo Contrast  Result Date: 11/10/2019 CLINICAL DATA:  Fall after hitting her left side of the face EXAM: CT HEAD WITHOUT  CONTRAST TECHNIQUE: Contiguous axial images were obtained from the base of the skull through the vertex without intravenous contrast. COMPARISON:  September 25, 2018 FINDINGS: Brain: No evidence of acute territorial infarction, hemorrhage, hydrocephalus,extra-axial collection or mass lesion/mass effect. There is dilatation the ventricles and sulci consistent with age-related atrophy. Low-attenuation changes in the deep white matter consistent with small vessel ischemia. Again noted is a prior infarct involving the left frontal lobe with ex vacuo dilatation of the left lateral ventricle. Prior lacunar infarcts cervical within the bilateral basal ganglia. Vascular: No hyperdense vessel or unexpected calcification. Skull: The skull is intact. No fracture or focal lesion identified. Sinuses/Orbits: The visualized paranasal sinuses and mastoid air cells are clear. The orbits and globes intact. Other: There is a small soft tissue hematoma seen overlying the left frontal skull and periorbital region. Face: Osseous: No acute fracture or other significant osseous abnormality.The nasal bone, mandibles, zygomatic arches and pterygoid plates are intact. Orbits: No fracture identified. Unremarkable appearance of globes and orbits. No retro-orbital collections are seen. Sinuses: The visualized paranasal sinuses and mastoid air cells are unremarkable. Soft tissues: Small soft tissue hematoma seen overlying the left frontal skull and periorbital region. Limited intracranial: No acute findings. Cervical spine: Alignment: There is straightening of the normal cervical lordosis. Skull base and vertebrae: Visualized skull base is intact. No atlanto-occipital dissociation. The vertebral body heights are well maintained. No fracture or pathologic osseous lesion seen. Soft tissues and spinal canal: The visualized paraspinal soft tissues are unremarkable. No prevertebral soft tissue swelling is seen. The spinal canal is grossly unremarkable, no  large epidural collection or significant canal narrowing. Disc levels: Disc height loss with uncovertebral osteophytes and disc osteophyte complex is most notable at C5-C6 and C6-C7 with mild neural foraminal and canal narrowing. Upper chest: The lung apices are clear. Thoracic inlet is within normal limits. Other: Scattered carotid artery calcifications are seen. Aortic atherosclerosis is noted. For which IMPRESSION: No acute intracranial abnormality. Findings consistent with age related atrophy and chronic small vessel ischemia Prior lacunar infarcts as described above Soft tissue hematoma and swelling over the left frontal skull and periorbital region No acute facial fracture No acute fracture or malalignment of the spine. Electronically Signed   By: Prudencio Pair M.D.   On: 11/10/2019 00:07        Scheduled Meds: . amLODipine  10 mg Oral Daily  . aspirin  81 mg Oral Daily  . atorvastatin  20 mg Oral Daily  . cloNIDine  0.3 mg Oral BID  . clopidogrel  75 mg Oral Daily  . enoxaparin (LOVENOX) injection  40 mg Subcutaneous BID  . gabapentin  200 mg Oral TID  . hydrALAZINE  100 mg Oral BID  . insulin aspart  0-15 Units Subcutaneous TID AC & HS  . insulin glargine  16 Units Subcutaneous Q2200  . levETIRAcetam  500 mg Oral BID  . metoprolol succinate  100 mg Oral Daily  . oxybutynin  5 mg Oral BID  . pantoprazole  40 mg Oral Daily   Continuous Infusions: . sodium chloride 100 mL/hr at 11/10/19 0554     LOS: 0 days    Time spent: 34 mins     Wyvonnia Dusky, MD [facility]  Pager [phone]  If 7PM-7AM, please contact night-coverage [email or web address] 11/10/2019, 7:39 AM

## 2019-11-10 NOTE — ED Notes (Signed)
Warm blanket provided to pt.

## 2019-11-11 DIAGNOSIS — N3281 Overactive bladder: Secondary | ICD-10-CM | POA: Diagnosis present

## 2019-11-11 DIAGNOSIS — S62341A Nondisplaced fracture of base of second metacarpal bone. left hand, initial encounter for closed fracture: Secondary | ICD-10-CM | POA: Diagnosis not present

## 2019-11-11 DIAGNOSIS — W010XXA Fall on same level from slipping, tripping and stumbling without subsequent striking against object, initial encounter: Secondary | ICD-10-CM | POA: Diagnosis present

## 2019-11-11 DIAGNOSIS — Z833 Family history of diabetes mellitus: Secondary | ICD-10-CM | POA: Diagnosis not present

## 2019-11-11 DIAGNOSIS — E1142 Type 2 diabetes mellitus with diabetic polyneuropathy: Secondary | ICD-10-CM | POA: Diagnosis present

## 2019-11-11 DIAGNOSIS — S2249XA Multiple fractures of ribs, unspecified side, initial encounter for closed fracture: Secondary | ICD-10-CM | POA: Diagnosis not present

## 2019-11-11 DIAGNOSIS — S62341D Nondisplaced fracture of base of second metacarpal bone. left hand, subsequent encounter for fracture with routine healing: Secondary | ICD-10-CM | POA: Diagnosis not present

## 2019-11-11 DIAGNOSIS — Z7982 Long term (current) use of aspirin: Secondary | ICD-10-CM | POA: Diagnosis not present

## 2019-11-11 DIAGNOSIS — Z79899 Other long term (current) drug therapy: Secondary | ICD-10-CM | POA: Diagnosis not present

## 2019-11-11 DIAGNOSIS — Z66 Do not resuscitate: Secondary | ICD-10-CM | POA: Diagnosis present

## 2019-11-11 DIAGNOSIS — I313 Pericardial effusion (noninflammatory): Secondary | ICD-10-CM | POA: Diagnosis not present

## 2019-11-11 DIAGNOSIS — Z83438 Family history of other disorder of lipoprotein metabolism and other lipidemia: Secondary | ICD-10-CM | POA: Diagnosis not present

## 2019-11-11 DIAGNOSIS — E1165 Type 2 diabetes mellitus with hyperglycemia: Secondary | ICD-10-CM | POA: Diagnosis present

## 2019-11-11 DIAGNOSIS — D649 Anemia, unspecified: Secondary | ICD-10-CM | POA: Diagnosis present

## 2019-11-11 DIAGNOSIS — E1122 Type 2 diabetes mellitus with diabetic chronic kidney disease: Secondary | ICD-10-CM | POA: Diagnosis present

## 2019-11-11 DIAGNOSIS — Z20822 Contact with and (suspected) exposure to covid-19: Secondary | ICD-10-CM | POA: Diagnosis present

## 2019-11-11 DIAGNOSIS — S2242XA Multiple fractures of ribs, left side, initial encounter for closed fracture: Secondary | ICD-10-CM | POA: Diagnosis not present

## 2019-11-11 DIAGNOSIS — S52615A Nondisplaced fracture of left ulna styloid process, initial encounter for closed fracture: Secondary | ICD-10-CM | POA: Diagnosis present

## 2019-11-11 DIAGNOSIS — I2510 Atherosclerotic heart disease of native coronary artery without angina pectoris: Secondary | ICD-10-CM | POA: Diagnosis present

## 2019-11-11 DIAGNOSIS — Z7902 Long term (current) use of antithrombotics/antiplatelets: Secondary | ICD-10-CM | POA: Diagnosis not present

## 2019-11-11 DIAGNOSIS — E1151 Type 2 diabetes mellitus with diabetic peripheral angiopathy without gangrene: Secondary | ICD-10-CM | POA: Diagnosis present

## 2019-11-11 DIAGNOSIS — R531 Weakness: Secondary | ICD-10-CM | POA: Diagnosis present

## 2019-11-11 DIAGNOSIS — I129 Hypertensive chronic kidney disease with stage 1 through stage 4 chronic kidney disease, or unspecified chronic kidney disease: Secondary | ICD-10-CM | POA: Diagnosis present

## 2019-11-11 DIAGNOSIS — S92424D Nondisplaced fracture of distal phalanx of right great toe, subsequent encounter for fracture with routine healing: Secondary | ICD-10-CM | POA: Diagnosis not present

## 2019-11-11 DIAGNOSIS — S92424A Nondisplaced fracture of distal phalanx of right great toe, initial encounter for closed fracture: Secondary | ICD-10-CM

## 2019-11-11 DIAGNOSIS — Z8249 Family history of ischemic heart disease and other diseases of the circulatory system: Secondary | ICD-10-CM | POA: Diagnosis not present

## 2019-11-11 DIAGNOSIS — Z79891 Long term (current) use of opiate analgesic: Secondary | ICD-10-CM | POA: Diagnosis not present

## 2019-11-11 DIAGNOSIS — N1831 Chronic kidney disease, stage 3a: Secondary | ICD-10-CM | POA: Diagnosis present

## 2019-11-11 LAB — GLUCOSE, CAPILLARY
Glucose-Capillary: 115 mg/dL — ABNORMAL HIGH (ref 70–99)
Glucose-Capillary: 194 mg/dL — ABNORMAL HIGH (ref 70–99)
Glucose-Capillary: 285 mg/dL — ABNORMAL HIGH (ref 70–99)
Glucose-Capillary: 247 mg/dL — ABNORMAL HIGH (ref 70–99)

## 2019-11-11 LAB — BASIC METABOLIC PANEL
Anion gap: 6 (ref 5–15)
BUN: 31 mg/dL — ABNORMAL HIGH (ref 8–23)
CO2: 21 mmol/L — ABNORMAL LOW (ref 22–32)
Calcium: 7.8 mg/dL — ABNORMAL LOW (ref 8.9–10.3)
Chloride: 108 mmol/L (ref 98–111)
Creatinine, Ser: 1.51 mg/dL — ABNORMAL HIGH (ref 0.44–1.00)
GFR calc Af Amer: 42 mL/min — ABNORMAL LOW (ref >60)
GFR calc non Af Amer: 36 mL/min — ABNORMAL LOW (ref >60)
Glucose, Bld: 76 mg/dL (ref 70–99)
Potassium: 4.1 mmol/L (ref 3.5–5.1)
Sodium: 135 mmol/L (ref 135–145)

## 2019-11-11 LAB — CBC
HCT: 31.1 % — ABNORMAL LOW (ref 36.0–46.0)
Hemoglobin: 10.2 g/dL — ABNORMAL LOW (ref 12.0–15.0)
MCH: 28 pg (ref 26.0–34.0)
MCHC: 32.8 g/dL (ref 30.0–36.0)
MCV: 85.4 fL (ref 80.0–100.0)
Platelets: 201 10*3/uL (ref 150–400)
RBC: 3.64 MIL/uL — ABNORMAL LOW (ref 3.87–5.11)
RDW: 13.5 % (ref 11.5–15.5)
WBC: 7.2 10*3/uL (ref 4.0–10.5)
nRBC: 0 % (ref 0.0–0.2)

## 2019-11-11 MED ORDER — SODIUM BICARBONATE 650 MG PO TABS
650.0000 mg | ORAL_TABLET | Freq: Two times a day (BID) | ORAL | Status: DC
  Administered 2019-11-11 – 2019-11-13 (×5): 650 mg via ORAL
  Filled 2019-11-11 (×6): qty 1

## 2019-11-11 NOTE — TOC Progression Note (Signed)
Transition of Care [facility]) - Progression Note    Patient Details  Name: Susan House MRN: [id number] Date of Birth: [birth year]  Transition of Care [facility]) CM/SW [place], RN Phone Number: 11/11/2019, 10:15 AM  Clinical Narrative:     Corene Cornea with [facility] notified me that they are unable to accept the patient due to staffing I called [place] at [facility] and they are unable to accept the patient due to staffing, Cassie with [facility] stated that they are unable to accept the patient due to staffing, I called Malachy Mood with [facility] and they are able to accept the patient. I notified the patient that they accepted the patient for PT  Expected Discharge Plan: [place] Barriers to Discharge: Continued Medical Work up  Expected Discharge Plan and Services Expected Discharge Plan: [place]   Discharge Planning Services: CM Consult   Living arrangements for the past 2 months: Single Family Home                 DME Arranged: N/A         HH Arranged: PT HH Agency: [place] ([place]) Date HH Agency Contacted: 11/11/19 Time [place]: [phone] Representative spoke with at [place]: [place] ([place]) Interventions    Readmission Risk Interventions No flowsheet data found.

## 2019-11-11 NOTE — TOC Transition Note (Signed)
Transition of Care [facility]) - CM/SW Discharge Note   Patient Details  Name: Eiley Mcginnity MRN: [id number] Date of Birth: October 16, 1955  Transition of Care [facility]) CM/SW Contact:  Su Hilt, RN Phone Number: 11/11/2019, 8:43 AM   Clinical Narrative:    Met with the patient to discuss DC plan and needs She lives at home with her husband, they both drive, she has a platform walker at home from when she broke her arms previously, she does not need additional equipment, She would like to have [place] set up and wants to use [facility], I called Corene Cornea with [facility], he will let me know of they can accept the patient. She is up to date with her PCP and can afford her medications, no additional Needs   Final next level of care: [place] Barriers to Discharge: Continued Medical Work up   Patient Goals and CMS Choice Patient states their goals for this hospitalization and ongoing recovery are:: get well      Discharge Placement                       Discharge Plan and Services   Discharge Planning Services: CM Consult            DME Arranged: N/A         HH Arranged: PT [place] Agency: [place] ([place]) Date HH Agency Contacted: 11/11/19 Time [place]: [phone] Representative spoke with at [place]: [place] ([place]) Interventions     Readmission Risk Interventions No flowsheet data found.

## 2019-11-11 NOTE — Progress Notes (Signed)
PROGRESS NOTE    Susan House  OZH:[id number] DOB: 1956/03/09 DOA: 11/09/2019 PCP: Jerrol Banana., MD      Assessment & Plan:   Active Problems:   Fall   Mechanical fall: w/subsequent multiple fractures including the left wrist, sixth & seventh ribs and right big toe. IV Toradol and oxycodone prn for pain. Weight bearing as tolerated through her heel on RLE & non-weight bearing w/ left hand as per ortho surg. F/u w/ ortho surg in 5-7 days for repeat Xrays. Ortho recs apprec. PT/OT recs SNF  Hypertension: will continue amlodipine, clonidine, hydralazine, and metoprolol.  DM2: with peripheral neuropathy. Continue on lantus & SSI w/ accuchecks. Continue to hold metformin. Continue on gabapentin  CAD: will continue aspirin, plavix, statin and metoprolol and nitro prn   Overactive bladder: will continue oxybutynin   AKI vs CKD: baseline Cr/GFR is unknown. Cr is labile. Will continue to monitor   Normocytic anemia: etiology unclear. No need for a transfusion at this time. Will continue to monitor   Generalized weakness: PT/OT consulted   DVT prophylaxis: lovenox Code Status: DNR Family Communication:  Disposition Plan: will d/c to SNF. CM will start to work on this tomorrow    Consultants:   Ortho surgery, Dr. Harlow Mares  Procedures:   Antimicrobials:    Subjective: Pt c/o malaise.   Objective: Vitals:   11/10/19 1703 11/10/19 2228 11/10/19 2326 11/11/19 0740  BP: (!) 140/49 (!) 170/65 (!) 134/56 (!) 144/50  Pulse: 63 66 65 (!) 57  Resp: 17 16 18 17   Temp: 97.8 F (36.6 C) 97.6 F (36.4 C) (!) 97.4 F (36.3 C) (!) 97.5 F (36.4 C)  TempSrc: Oral Oral Oral Oral  SpO2: 96% 97% 97% 96%  Weight:      Height:        Intake/Output Summary (Last 24 hours) at 11/11/2019 0746 Last data filed at 11/11/2019 0430 Gross per 24 hour  Intake 240 ml  Output 375 ml  Net -135 ml   Filed Weights   11/09/19 2317 11/10/19 0445  Weight: 104.3 kg 104.2 kg     Examination:  General exam: Appears calm and comfortable. Black left eye Respiratory system: Clear to auscultation. Normal RR.  Cardiovascular system: S1 & S2+. No rubs, gallops or clicks.  Gastrointestinal system: Abdomen is obese, soft and nontender.  Normal bowel sounds heard. Central nervous system: Alert and oriented. Moves all 4 extremities Psychiatry: Judgement and insight appear normal. Flat mood and affect.     Data Reviewed: I have personally reviewed following labs and imaging studies  CBC: Recent Labs  Lab 11/10/19 0230 11/10/19 0457 11/11/19 0607  WBC 12.8* 11.9* 7.2  NEUTROABS 10.0*  --   --   HGB 11.5* 11.5* 10.2*  HCT 34.7* 35.8* 31.1*  MCV 85.7 87.5 85.4  PLT 247 262 629   Basic Metabolic Panel: Recent Labs  Lab 11/10/19 0230 11/10/19 0457 11/11/19 0607  NA 136 136 135  K 4.4 4.4 4.1  CL 104 104 108  CO2 21* 21* 21*  GLUCOSE 302* 311* 76  BUN 26* 28* 31*  CREATININE 1.36* 1.24* 1.51*  CALCIUM 8.5* 8.3* 7.8*   GFR: Estimated Creatinine Clearance: 43.2 mL/min (A) (by C-G formula based on SCr of 1.51 mg/dL (H)). Liver Function Tests: No results for input(s): AST, ALT, ALKPHOS, BILITOT, PROT, ALBUMIN in the last 168 hours. No results for input(s): LIPASE, AMYLASE in the last 168 hours. No results for input(s): AMMONIA in the last  168 hours. Coagulation Profile: No results for input(s): INR, PROTIME in the last 168 hours. Cardiac Enzymes: No results for input(s): CKTOTAL, CKMB, CKMBINDEX, TROPONINI in the last 168 hours. BNP (last 3 results) No results for input(s): PROBNP in the last 8760 hours. HbA1C: Recent Labs    11/10/19 0457  HGBA1C 10.0*   CBG: Recent Labs  Lab 11/10/19 0802 11/10/19 1156 11/10/19 1704 11/10/19 2033 11/11/19 0741  GLUCAP 316* 155* 71 135* 115*   Lipid Profile: No results for input(s): CHOL, HDL, LDLCALC, TRIG, CHOLHDL, LDLDIRECT in the last 72 hours. Thyroid Function Tests: No results for input(s): TSH,  T4TOTAL, FREET4, T3FREE, THYROIDAB in the last 72 hours. Anemia Panel: No results for input(s): VITAMINB12, FOLATE, FERRITIN, TIBC, IRON, RETICCTPCT in the last 72 hours. Sepsis Labs: No results for input(s): PROCALCITON, LATICACIDVEN in the last 168 hours.  Recent Results (from the past 240 hour(s))  Respiratory Panel by RT PCR (Flu A&B, Covid) - Nasopharyngeal Swab     Status: None   Collection Time: 11/10/19  2:10 AM   Specimen: Nasopharyngeal Swab  Result Value Ref Range Status   SARS Coronavirus 2 by RT PCR NEGATIVE NEGATIVE Final    Comment: (NOTE) SARS-CoV-2 target nucleic acids are NOT DETECTED. The SARS-CoV-2 RNA is generally detectable in upper respiratoy specimens during the acute phase of infection. The lowest concentration of SARS-CoV-2 viral copies this assay can detect is 131 copies/mL. A negative result does not preclude SARS-Cov-2 infection and should not be used as the sole basis for treatment or other patient management decisions. A negative result may occur with  improper specimen collection/handling, submission of specimen other than nasopharyngeal swab, presence of viral mutation(s) within the areas targeted by this assay, and inadequate number of viral copies (<131 copies/mL). A negative result must be combined with clinical observations, patient history, and epidemiological information. The expected result is Negative. Fact Sheet for Patients:  PinkCheek.be Fact Sheet for Healthcare Providers:  GravelBags.it This test is not yet ap proved or cleared by the [place] FDA and  has been authorized for detection and/or diagnosis of SARS-CoV-2 by FDA under an Emergency Use Authorization (EUA). This EUA will remain  in effect (meaning this test can be used) for the duration of the COVID-19 declaration under Section 564(b)(1) of the Act, 21 U.S.C. section 360bbb-3(b)(1), unless the authorization is  terminated or revoked sooner.    Influenza A by PCR NEGATIVE NEGATIVE Final   Influenza B by PCR NEGATIVE NEGATIVE Final    Comment: (NOTE) The Xpert Xpress SARS-CoV-2/FLU/RSV assay is intended as an aid in  the diagnosis of influenza from Nasopharyngeal swab specimens and  should not be used as a sole basis for treatment. Nasal washings and  aspirates are unacceptable for Xpert Xpress SARS-CoV-2/FLU/RSV  testing. Fact Sheet for Patients: PinkCheek.be Fact Sheet for Healthcare Providers: GravelBags.it This test is not yet approved or cleared by the [place] FDA and  has been authorized for detection and/or diagnosis of SARS-CoV-2 by  FDA under an Emergency Use Authorization (EUA). This EUA will remain  in effect (meaning this test can be used) for the duration of the  Covid-19 declaration under Section 564(b)(1) of the Act, 21  U.S.C. section 360bbb-3(b)(1), unless the authorization is  terminated or revoked. Performed at [facility], [address], [place], [place] [address]   Urine Culture     Status: Abnormal   Collection Time: 11/10/19  2:30 AM   Specimen: Urine, Clean Catch  Result Value  Ref Range Status   Specimen Description   Final    URINE, CLEAN CATCH Performed at [facility], [address], [place], [place] [address]    Special Requests   Final    Normal Performed at [facility], [address] [place],  [address]    Culture MULTIPLE SPECIES PRESENT, SUGGEST RECOLLECTION (A)  Final   Report Status 11/10/2019 FINAL  Final         Radiology Studies: DG Ribs Unilateral W/Chest Left  Result Date: 11/10/2019 CLINICAL DATA:  Status post fall. EXAM: LEFT RIBS AND CHEST - 3+ VIEW COMPARISON:  Nov 29, 2018 FINDINGS: A nondisplaced lateral sixth and seventh left rib fracture is seen. Chronic deformities are seen involving the proximal left humerus and distal aspect  of the right clavicle. There is no evidence of pneumothorax or pleural effusion. Both lungs are clear. Heart size and mediastinal contours are within normal limits. There is mild calcification of the aortic arch. Radiopaque surgical clips are seen within the right upper quadrant. IMPRESSION: Nondisplaced lateral sixth and seventh left rib fractures. Electronically Signed   By: Virgina Norfolk M.D.   On: 11/10/2019 00:27   DG Wrist Complete Left  Result Date: 11/10/2019 CLINICAL DATA:  Status post fall. EXAM: LEFT WRIST - COMPLETE 3+ VIEW COMPARISON:  February 25, 2007 FINDINGS: A small fracture of the left ulnar styloid is seen. A chronic fracture deformity is seen involving the distal left radius. This is present on the prior study. An ill-defined area of cortical irregularity is seen involving the proximal aspect of the second left metacarpal. Mild to moderate severity diffuse soft tissue swelling is seen. IMPRESSION: 1. Small fracture of the left ulnar styloid. 2. Additional findings suspicious for a nondisplaced fracture of the second left metacarpal. Electronically Signed   By: Virgina Norfolk M.D.   On: 11/10/2019 00:31   CT Head Wo Contrast  Result Date: 11/10/2019 CLINICAL DATA:  Fall after hitting her left side of the face EXAM: CT HEAD WITHOUT CONTRAST TECHNIQUE: Contiguous axial images were obtained from the base of the skull through the vertex without intravenous contrast. COMPARISON:  September 25, 2018 FINDINGS: Brain: No evidence of acute territorial infarction, hemorrhage, hydrocephalus,extra-axial collection or mass lesion/mass effect. There is dilatation the ventricles and sulci consistent with age-related atrophy. Low-attenuation changes in the deep white matter consistent with small vessel ischemia. Again noted is a prior infarct involving the left frontal lobe with ex vacuo dilatation of the left lateral ventricle. Prior lacunar infarcts cervical within the bilateral basal ganglia.  Vascular: No hyperdense vessel or unexpected calcification. Skull: The skull is intact. No fracture or focal lesion identified. Sinuses/Orbits: The visualized paranasal sinuses and mastoid air cells are clear. The orbits and globes intact. Other: There is a small soft tissue hematoma seen overlying the left frontal skull and periorbital region. Face: Osseous: No acute fracture or other significant osseous abnormality.The nasal bone, mandibles, zygomatic arches and pterygoid plates are intact. Orbits: No fracture identified. Unremarkable appearance of globes and orbits. No retro-orbital collections are seen. Sinuses: The visualized paranasal sinuses and mastoid air cells are unremarkable. Soft tissues: Small soft tissue hematoma seen overlying the left frontal skull and periorbital region. Limited intracranial: No acute findings. Cervical spine: Alignment: There is straightening of the normal cervical lordosis. Skull base and vertebrae: Visualized skull base is intact. No atlanto-occipital dissociation. The vertebral body heights are well maintained. No fracture or pathologic osseous lesion seen. Soft tissues and spinal canal: The  visualized paraspinal soft tissues are unremarkable. No prevertebral soft tissue swelling is seen. The spinal canal is grossly unremarkable, no large epidural collection or significant canal narrowing. Disc levels: Disc height loss with uncovertebral osteophytes and disc osteophyte complex is most notable at C5-C6 and C6-C7 with mild neural foraminal and canal narrowing. Upper chest: The lung apices are clear. Thoracic inlet is within normal limits. Other: Scattered carotid artery calcifications are seen. Aortic atherosclerosis is noted. For which IMPRESSION: No acute intracranial abnormality. Findings consistent with age related atrophy and chronic small vessel ischemia Prior lacunar infarcts as described above Soft tissue hematoma and swelling over the left frontal skull and periorbital  region No acute facial fracture No acute fracture or malalignment of the spine. Electronically Signed   By: Prudencio Pair M.D.   On: 11/10/2019 00:07   CT Chest Wo Contrast  Result Date: 11/10/2019 CLINICAL DATA:  Status post trauma. EXAM: CT CHEST WITHOUT CONTRAST TECHNIQUE: Multidetector CT imaging of the chest was performed following the standard protocol without IV contrast. COMPARISON:  Nov 29, 2018 FINDINGS: Cardiovascular: There is marked severity calcification of the thoracic aorta. Normal heart size. A stable small to moderate sized posterior pericardial effusion is seen. There is marked severity coronary artery calcification. Mediastinum/Nodes: No enlarged mediastinal or axillary lymph nodes. Thyroid gland, trachea, and esophagus demonstrate no significant findings. Lungs/Pleura: Lungs are clear. No pleural effusion or pneumothorax. Upper Abdomen: Multiple surgical clips are seen within the gallbladder fossa. Musculoskeletal: Acute, nondisplaced third through ninth lateral left rib fractures are seen. A chronic deformity is seen along the distal aspect of the right clavicle. IMPRESSION: 1. Acute, nondisplaced third through ninth lateral left rib fractures. 2. Stable small to moderate sized posterior pericardial effusion. 3. Marked severity coronary artery calcification. Aortic Atherosclerosis (ICD10-I70.0). Electronically Signed   By: Virgina Norfolk M.D.   On: 11/10/2019 02:03   CT Cervical Spine Wo Contrast  Result Date: 11/10/2019 CLINICAL DATA:  Fall after hitting her left side of the face EXAM: CT HEAD WITHOUT CONTRAST TECHNIQUE: Contiguous axial images were obtained from the base of the skull through the vertex without intravenous contrast. COMPARISON:  September 25, 2018 FINDINGS: Brain: No evidence of acute territorial infarction, hemorrhage, hydrocephalus,extra-axial collection or mass lesion/mass effect. There is dilatation the ventricles and sulci consistent with age-related atrophy.  Low-attenuation changes in the deep white matter consistent with small vessel ischemia. Again noted is a prior infarct involving the left frontal lobe with ex vacuo dilatation of the left lateral ventricle. Prior lacunar infarcts cervical within the bilateral basal ganglia. Vascular: No hyperdense vessel or unexpected calcification. Skull: The skull is intact. No fracture or focal lesion identified. Sinuses/Orbits: The visualized paranasal sinuses and mastoid air cells are clear. The orbits and globes intact. Other: There is a small soft tissue hematoma seen overlying the left frontal skull and periorbital region. Face: Osseous: No acute fracture or other significant osseous abnormality.The nasal bone, mandibles, zygomatic arches and pterygoid plates are intact. Orbits: No fracture identified. Unremarkable appearance of globes and orbits. No retro-orbital collections are seen. Sinuses: The visualized paranasal sinuses and mastoid air cells are unremarkable. Soft tissues: Small soft tissue hematoma seen overlying the left frontal skull and periorbital region. Limited intracranial: No acute findings. Cervical spine: Alignment: There is straightening of the normal cervical lordosis. Skull base and vertebrae: Visualized skull base is intact. No atlanto-occipital dissociation. The vertebral body heights are well maintained. No fracture or pathologic osseous lesion seen. Soft tissues and spinal canal: The visualized  paraspinal soft tissues are unremarkable. No prevertebral soft tissue swelling is seen. The spinal canal is grossly unremarkable, no large epidural collection or significant canal narrowing. Disc levels: Disc height loss with uncovertebral osteophytes and disc osteophyte complex is most notable at C5-C6 and C6-C7 with mild neural foraminal and canal narrowing. Upper chest: The lung apices are clear. Thoracic inlet is within normal limits. Other: Scattered carotid artery calcifications are seen. Aortic  atherosclerosis is noted. For which IMPRESSION: No acute intracranial abnormality. Findings consistent with age related atrophy and chronic small vessel ischemia Prior lacunar infarcts as described above Soft tissue hematoma and swelling over the left frontal skull and periorbital region No acute facial fracture No acute fracture or malalignment of the spine. Electronically Signed   By: Prudencio Pair M.D.   On: 11/10/2019 00:07   DG Toe Great Right  Result Date: 11/10/2019 CLINICAL DATA:  Status post fall. EXAM: RIGHT GREAT TOE COMPARISON:  None. FINDINGS: A nondisplaced fracture deformity is seen extending through the distal phalanx of the right great toe. There is no evidence of dislocation. There is no evidence of arthropathy or other focal bone abnormality. Mild diffuse soft tissue swelling is seen. IMPRESSION: Nondisplaced fracture of the distal phalanx of the right great toe. Electronically Signed   By: Virgina Norfolk M.D.   On: 11/10/2019 00:24   CT Maxillofacial Wo Contrast  Result Date: 11/10/2019 CLINICAL DATA:  Fall after hitting her left side of the face EXAM: CT HEAD WITHOUT CONTRAST TECHNIQUE: Contiguous axial images were obtained from the base of the skull through the vertex without intravenous contrast. COMPARISON:  September 25, 2018 FINDINGS: Brain: No evidence of acute territorial infarction, hemorrhage, hydrocephalus,extra-axial collection or mass lesion/mass effect. There is dilatation the ventricles and sulci consistent with age-related atrophy. Low-attenuation changes in the deep white matter consistent with small vessel ischemia. Again noted is a prior infarct involving the left frontal lobe with ex vacuo dilatation of the left lateral ventricle. Prior lacunar infarcts cervical within the bilateral basal ganglia. Vascular: No hyperdense vessel or unexpected calcification. Skull: The skull is intact. No fracture or focal lesion identified. Sinuses/Orbits: The visualized paranasal sinuses  and mastoid air cells are clear. The orbits and globes intact. Other: There is a small soft tissue hematoma seen overlying the left frontal skull and periorbital region. Face: Osseous: No acute fracture or other significant osseous abnormality.The nasal bone, mandibles, zygomatic arches and pterygoid plates are intact. Orbits: No fracture identified. Unremarkable appearance of globes and orbits. No retro-orbital collections are seen. Sinuses: The visualized paranasal sinuses and mastoid air cells are unremarkable. Soft tissues: Small soft tissue hematoma seen overlying the left frontal skull and periorbital region. Limited intracranial: No acute findings. Cervical spine: Alignment: There is straightening of the normal cervical lordosis. Skull base and vertebrae: Visualized skull base is intact. No atlanto-occipital dissociation. The vertebral body heights are well maintained. No fracture or pathologic osseous lesion seen. Soft tissues and spinal canal: The visualized paraspinal soft tissues are unremarkable. No prevertebral soft tissue swelling is seen. The spinal canal is grossly unremarkable, no large epidural collection or significant canal narrowing. Disc levels: Disc height loss with uncovertebral osteophytes and disc osteophyte complex is most notable at C5-C6 and C6-C7 with mild neural foraminal and canal narrowing. Upper chest: The lung apices are clear. Thoracic inlet is within normal limits. Other: Scattered carotid artery calcifications are seen. Aortic atherosclerosis is noted. For which IMPRESSION: No acute intracranial abnormality. Findings consistent with age related atrophy and chronic  small vessel ischemia Prior lacunar infarcts as described above Soft tissue hematoma and swelling over the left frontal skull and periorbital region No acute facial fracture No acute fracture or malalignment of the spine. Electronically Signed   By: Prudencio Pair M.D.   On: 11/10/2019 00:07        Scheduled  Meds: . amLODipine  10 mg Oral Daily  . aspirin  81 mg Oral Daily  . atorvastatin  20 mg Oral Daily  . cloNIDine  0.3 mg Oral BID  . clopidogrel  75 mg Oral Daily  . docusate sodium  200 mg Oral BID  . enoxaparin (LOVENOX) injection  40 mg Subcutaneous BID  . gabapentin  200 mg Oral TID  . hydrALAZINE  100 mg Oral BID  . insulin aspart  0-15 Units Subcutaneous TID AC & HS  . insulin glargine  16 Units Subcutaneous Q2200  . levETIRAcetam  500 mg Oral BID  . metoprolol succinate  100 mg Oral Daily  . oxybutynin  5 mg Oral BID  . pantoprazole  40 mg Oral Daily  . sodium bicarbonate  650 mg Oral BID   Continuous Infusions: . sodium chloride 75 mL/hr at 11/10/19 1759     LOS: 0 days    Time spent: 33 mins     Wyvonnia Dusky, MD [facility] Pager [phone]  If 7PM-7AM, please contact night-coverage [email or web address] 11/11/2019, 7:46 AM

## 2019-11-11 NOTE — Care Management Obs Status (Signed)
[address] NOTIFICATION   Patient Details  Name: Greg Eckrich MRN: [id number] Date of Birth: 1956-01-05   Medicare Observation Status Notification Given:  Yes    Master Touchet Jen Mow, RN 11/11/2019, 8:38 AM

## 2019-11-11 NOTE — Evaluation (Signed)
Physical Therapy Evaluation Patient Details Name: Susan House MRN: [id number] DOB: Dec 07, 1955 Today's Date: 11/11/2019   History of Present Illness  Susan House '[name] comes to [facility] 4/25 via EMS due to a fall. Pt hit her left side of the face on the door frame. Imaging revealing of left ulnar styloid fracture and Left ribs 6, 7 fractures (laterally), and Rt hallux distal phalanx fracture. Othorpedic consult recommends cam rocker and heel weight bearing on Rt foot and LUE nonweightbearing (platform walker ok to use.) PMH: HTN, PVD, CVA, migraine HA.  Clinical Impression  Pt admitted with above diagnosis. Pt currently with functional limitations due to the deficits listed below (see "PT Problem List"). Upon entry, pt in bed, awake and working with OT, pt agreeable to tandem evaluation. Husband at bedside. The pt is alert and oriented x4, pleasant, conversational, and generally a good historian- husband provides additional detail as needed. MinA for bed mobility with slight HOB elevation. Mod-maxA for STS transfer, but pt able to perform with minA from elevated surface and max effort. Pt has increased rib fracture pain with rising to standing, but is motivated to overcome. Pt able to AMB 5ft with Left platform walker, 3-point step-to gait with consistent knee  buckling (no gross loss of knee stability), but AMB appears generally weak, labored, and somewhat unsafe. Functional mobility assessment demonstrates increased effort/time requirements, fair tolerance, and need for physical assistance, whereas the patient performed these at a higher level of independence PTA. It seems unsafe to return to home at this time, but if the patient is persistent, her safety and mobility needs could be heavily mitigated with some DME additions (Wheel chair)- essentially I'd rather the patient minimize AMB in the home until performing better with HHPT. will benefit from skilled PT intervention to increase independence  and safety with basic mobility in preparation for discharge to the venue listed below.       Follow Up Recommendations SNF;Supervision for mobility/OOB;Other (comment)(Pt very much not in favor of rehab stay; strong preference for return to home at DC)    Equipment Recommendations  Wheelchair (measurements PT);Wheelchair cushion (measurements PT)    Recommendations for Other Services       Precautions / Restrictions Precautions Precautions: Fall Restrictions Weight Bearing Restrictions: Yes LUE Weight Bearing: Non weight bearing RLE Weight Bearing: Weight bearing as tolerated      Mobility  Bed Mobility Overal bed mobility: Needs Assistance Bed Mobility: Supine to Sit     Supine to sit: Min assist;HOB elevated     General bed mobility comments: additional time, some physical assist  Transfers Overall transfer level: Needs assistance Equipment used: Rolling walker (2 wheeled);Left platform walker Transfers: Sit to/from Stand Sit to Stand: Mod assist         General transfer comment: can perform wth minA from elevated surface  Ambulation/Gait   Gait Distance (Feet): 30 Feet Assistive device: Left platform walker Gait Pattern/deviations: Step-to pattern     General Gait Details: 3-point gait, appears weak and labored; has bilat knee buckling each step which I suspect is baseline. Chair follow utilized. Hallux pain at end of AMB  Stairs            Wheelchair Mobility    Modified Rankin (Stroke Patients Only)       Balance Overall balance assessment: History of Falls;Needs assistance Sitting-balance support: Single extremity supported Sitting balance-Leahy Scale: Good     Standing balance support: During functional activity;Single extremity supported Standing balance-Leahy Scale: Poor  Pertinent Vitals/Pain Pain Assessment: Faces Faces Pain Scale: Hurts even more Pain Location: HA near Lt face; Left  ribs and central chest, Right hallux Pain Intervention(s): Limited activity within patient's tolerance;Monitored during session;Premedicated before session    Home Living Family/patient expects to be discharged to:: Private residence Living Arrangements: Spouse/significant other Available Help at Discharge: Family Type of Home: House Home Access: Ramped entrance     Home Layout: One level Home Equipment: Bedside commode;Walker - 2 wheels;Cane - single point;Transport chair      Prior Function Level of Independence: Independent         Comments: multiple recent falls     Hand Dominance   Dominant Hand: Right    Extremity/Trunk Assessment   Upper Extremity Assessment Upper Extremity Assessment: Generalized weakness    Lower Extremity Assessment Lower Extremity Assessment: Generalized weakness    Cervical / Trunk Assessment Cervical / Trunk Assessment: Kyphotic  Communication      Cognition Arousal/Alertness: Awake/alert Behavior During Therapy: WFL for tasks assessed/performed Overall Cognitive Status: Within Functional Limits for tasks assessed                                        General Comments      Exercises     Assessment/Plan    PT Assessment Patient needs continued PT services  PT Problem List Decreased strength;Decreased range of motion;Decreased activity tolerance;Decreased balance;Decreased mobility;Decreased coordination;Decreased cognition;Decreased knowledge of use of DME;Decreased safety awareness;Decreased knowledge of precautions;Cardiopulmonary status limiting activity;Obesity       PT Treatment Interventions DME instruction;Balance training;Gait training;Stair training;Functional mobility training;Therapeutic activities;Therapeutic exercise;Patient/family education    PT Goals (Current goals can be found in the Care Plan section)  Acute Rehab PT Goals Patient Stated Goal: return to home with help from husband PT Goal  Formulation: With patient Time For Goal Achievement: 11/25/19 Potential to Achieve Goals: Poor    Frequency 7X/week   Barriers to discharge Decreased caregiver support      Co-evaluation PT/OT/SLP Co-Evaluation/Treatment: Yes Reason for Co-Treatment: Complexity of the patient's impairments (multi-system involvement);To address functional/ADL transfers PT goals addressed during session: Mobility/safety with mobility OT goals addressed during session: ADL's and self-care;Proper use of Adaptive equipment and DME       AM-PAC PT "6 Clicks" Mobility  Outcome Measure Help needed turning from your back to your side while in a flat bed without using bedrails?: A Little Help needed moving from lying on your back to sitting on the side of a flat bed without using bedrails?: A Little Help needed moving to and from a bed to a chair (including a wheelchair)?: A Lot Help needed standing up from a chair using your arms (e.g., wheelchair or bedside chair)?: A Lot Help needed to walk in hospital room?: A Lot Help needed climbing 3-5 steps with a railing? : Total 6 Click Score: 13    End of Session Equipment Utilized During Treatment: Gait belt Activity Tolerance: Patient tolerated treatment well;Patient limited by fatigue;Patient limited by pain Patient left: in chair;with family/visitor present;with call bell/phone within reach;with chair alarm set Nurse Communication: Mobility status;Precautions;Weight bearing status PT Visit Diagnosis: Unsteadiness on feet (R26.81);Other abnormalities of gait and mobility (R26.89);Difficulty in walking, not elsewhere classified (R26.2);Muscle weakness (generalized) (M62.81);History of falling (Z91.81);Ataxic gait (R26.0)    Time: 1245-8099 PT Time Calculation (min) (ACUTE ONLY): 44 min   Charges:   PT Evaluation $PT Eval High Complexity:  1 High PT Treatments $Gait Training: 8-22 mins        3:02 PM, 11/11/19 Etta Grandchild, PT, DPT Physical  Therapist - [facility]  [phone] ([place])    Patrick C 11/11/2019, 2:50 PM

## 2019-11-11 NOTE — Evaluation (Signed)
Occupational Therapy Evaluation Patient Details Name: Susan House MRN: [id number] DOB: [birth year] Today's Date: 11/11/2019    History of Present Illness Susan House comes to [facility] 4/25 via EMS due to a fall. Pt hit her left side of the face on the door frame. Imaging revealing of left ulnar styloid fracture and Left ribs 6, 7 fractures (laterally), and Rt hallux distal phalanx fracture. Othorpedic consult recommends cam rocker and heel weight bearing on Rt foot and LUE nonweightbearing (platform walker ok to use.) PMH: HTN, PVD, CVA, migraine HA.   Clinical Impression   Susan House was seen for OT evaluation this date. Prior to hospital admission, pt was generally independent for ADL management. Pt lives with her husband in a 1 level home with a ramped entrance. Currently pt demonstrates impairments as described below (See OT problem list) which functionally limit her ability to perform ADL/self-care tasks. Pt currently requires MOD to MAX A for LB ADL management in seated position. MOD A for functional transfers/bed mobility. Min A to set-up for UB ADL.  Pt would benefit from skilled OT to address noted impairments and functional limitations (see below for any additional details) in order to maximize safety and independence while minimizing falls risk and caregiver burden. Upon hospital discharge, recommend STR to maximize pt safety and return to PLOF.      Follow Up Recommendations  SNF    Equipment Recommendations  None recommended by OT(Pt has necessary equipment.)    Recommendations for Other Services       Precautions / Restrictions Precautions Precautions: Fall Restrictions Weight Bearing Restrictions: Yes LUE Weight Bearing: Non weight bearing RLE Weight Bearing: Weight bearing as tolerated Other Position/Activity Restrictions: LUE splinted and to remain NWB. Per MD Pt OK to be WBAT through the heel of her RLE. Has post-op shoe for fxl mobility.      Mobility  Bed Mobility Overal bed mobility: Needs Assistance Bed Mobility: Supine to Sit     Supine to sit: HOB elevated;Mod assist     General bed mobility comments: additional time, physical assist for trunk elevation.  Transfers Overall transfer level: Needs assistance Equipment used: Rolling walker (2 wheeled);Left platform walker Transfers: Sit to/from Stand Sit to Stand: Mod assist         General transfer comment: can perform wth minA from elevated surface    Balance Overall balance assessment: History of Falls;Needs assistance Sitting-balance support: Single extremity supported;Feet supported Sitting balance-Leahy Scale: Good     Standing balance support: During functional activity;Bilateral upper extremity supported Standing balance-Leahy Scale: Poor Standing balance comment: Heavy UE use on RW                           ADL either performed or assessed with clinical judgement   ADL Overall ADL's : Needs assistance/impaired                                       General ADL Comments: Pt significantly functionally limited 2/2 increased pain, decreased functional use of her LUE/RLE, and decreased activity tolerance this date. She requires moderate assist for bed mobility as well as Min A for STS. MOD A for LB ADL management in seated position. Set-up/sup for seated UB ADL management.     Vision Baseline Vision/History: Wears glasses Wears Glasses: At all times Patient Visual Report: No change from  baseline Additional Comments: Pt reports dizzines upon initial presentation to the hospital but none recently     Perception     Praxis      Pertinent Vitals/Pain Pain Assessment: Faces Pain Score: 7  Faces Pain Scale: Hurts even more Pain Location: HA near Lt face; Left ribs and central chest, Right hallux Pain Descriptors / Indicators: Aching;Discomfort;Sore Pain Intervention(s): Limited activity within patient's tolerance;Monitored during  session;Premedicated before session;Repositioned;Utilized relaxation techniques     Hand Dominance Right   Extremity/Trunk Assessment Upper Extremity Assessment Upper Extremity Assessment: Generalized weakness;LUE deficits/detail LUE Deficits / Details: LUE NWB; immobilized. LUE: Unable to fully assess due to pain;Unable to fully assess due to immobilization   Lower Extremity Assessment Lower Extremity Assessment: Generalized weakness   Cervical / Trunk Assessment Cervical / Trunk Assessment: Kyphotic   Communication Communication Communication: No difficulties   Cognition Arousal/Alertness: Awake/alert Behavior During Therapy: WFL for tasks assessed/performed Overall Cognitive Status: Within Functional Limits for tasks assessed                                     General Comments       Exercises Other Exercises Other Exercises: Pt and caregiver educated on falls prevention strategies, precautions for LUE/RLE, safe use of AE/DME for ADL management, and routines modifications to support safety and fxl independence upon hospital DC. Would benefit from additional instruction on compensatory ADL strategies with considerations for NWB status in LUE.   Shoulder Instructions      Home Living Family/patient expects to be discharged to:: Private residence Living Arrangements: Spouse/significant other Available Help at Discharge: Family Type of Home: House Home Access: [place]: One level     Bathroom Shower/Tub: Occupational psychologist: Handicapped height Bathroom Accessibility: Yes How Accessible: Accessible via [place]: Bedside commode;Walker - 2 wheels;Cane - single point;Transport chair;Shower seat - built in          Prior Functioning/Environment Level of Independence: Independent with assistive device(s)        Comments: Pt using a SPC for functional mobility, generally independent or MOD I for ADL  management, multiple recent falls        OT Problem List: Decreased strength;Pain;Decreased activity tolerance;Decreased safety awareness;Decreased range of motion;Impaired UE functional use;Impaired balance (sitting and/or standing);Decreased knowledge of use of DME or AE;Decreased coordination      OT Treatment/Interventions: Self-care/ADL training;Therapeutic exercise;Therapeutic activities;DME and/or AE instruction;Patient/family education;Balance training    OT Goals(Current goals can be found in the care plan section) Acute Rehab OT Goals Patient Stated Goal: return to home with help from husband OT Goal Formulation: With patient Time For Goal Achievement: 11/25/19 Potential to Achieve Goals: Fair ADL Goals Pt Will Perform Upper Body Dressing: sitting;with supervision;with caregiver independent in assisting;with set-up Pt Will Perform Lower Body Dressing: with min assist;sit to/from stand;with caregiver independent in assisting;with adaptive equipment Additional ADL Goal #1: Pt will independently verbalize a plan to implement at least 3 learned falls prevention strategies into her daily routines/home environment for improved safety and functional independence upon hospital DC.  OT Frequency: Min 2X/week   Barriers to D/C:            Co-evaluation PT/OT/SLP Co-Evaluation/Treatment: Yes Reason for Co-Treatment: Complexity of the patient's impairments (multi-system involvement);To address functional/ADL transfers;For patient/therapist safety PT goals addressed during session: Mobility/safety with mobility OT goals addressed during session: ADL's  and self-care;Proper use of Adaptive equipment and DME      AM-PAC OT "6 Clicks" Daily Activity     Outcome Measure Help from another person eating meals?: A Little Help from another person taking care of personal grooming?: A Little Help from another person toileting, which includes using toliet, bedpan, or urinal?: A Lot Help from  another person bathing (including washing, rinsing, drying)?: A Lot Help from another person to put on and taking off regular upper body clothing?: A Little Help from another person to put on and taking off regular lower body clothing?: A Lot 6 Click Score: 15   End of Session Equipment Utilized During Treatment: Gait belt;Rolling walker Nurse Communication: Mobility status  Activity Tolerance: Patient tolerated treatment well Patient left: Other (comment)(With PT in room to finish session.)  OT Visit Diagnosis: Other abnormalities of gait and mobility (R26.89);Pain Pain - Right/Left: (Both) Pain - part of body: Ankle and joints of foot;Arm;Hand                Time: 1308-6578 OT Time Calculation (min): 43 min Charges:  OT General Charges $OT Visit: 1 Visit OT Evaluation $OT Eval High Complexity: 1 High OT Treatments $Self Care/Home Management : 23-37 mins  Shara Blazing, M.S., OTR/L Ascom: [phone] 11/11/19, 4:19 PM

## 2019-11-12 DIAGNOSIS — S62341D Nondisplaced fracture of base of second metacarpal bone. left hand, subsequent encounter for fracture with routine healing: Secondary | ICD-10-CM

## 2019-11-12 LAB — BASIC METABOLIC PANEL
Anion gap: 6 (ref 5–15)
BUN: 35 mg/dL — ABNORMAL HIGH (ref 8–23)
CO2: 22 mmol/L (ref 22–32)
Calcium: 7.5 mg/dL — ABNORMAL LOW (ref 8.9–10.3)
Chloride: 108 mmol/L (ref 98–111)
Creatinine, Ser: 1.63 mg/dL — ABNORMAL HIGH (ref 0.44–1.00)
GFR calc Af Amer: 38 mL/min — ABNORMAL LOW (ref >60)
GFR calc non Af Amer: 33 mL/min — ABNORMAL LOW (ref >60)
Glucose, Bld: 132 mg/dL — ABNORMAL HIGH (ref 70–99)
Potassium: 4.4 mmol/L (ref 3.5–5.1)
Sodium: 136 mmol/L (ref 135–145)

## 2019-11-12 LAB — CBC
HCT: 28.2 % — ABNORMAL LOW (ref 36.0–46.0)
Hemoglobin: 9.5 g/dL — ABNORMAL LOW (ref 12.0–15.0)
MCH: 28.5 pg (ref 26.0–34.0)
MCHC: 33.7 g/dL (ref 30.0–36.0)
MCV: 84.7 fL (ref 80.0–100.0)
Platelets: 182 10*3/uL (ref 150–400)
RBC: 3.33 MIL/uL — ABNORMAL LOW (ref 3.87–5.11)
RDW: 13.6 % (ref 11.5–15.5)
WBC: 6.2 10*3/uL (ref 4.0–10.5)
nRBC: 0 % (ref 0.0–0.2)

## 2019-11-12 LAB — RESPIRATORY PANEL BY RT PCR (FLU A&B, COVID)
Influenza A by PCR: NEGATIVE
Influenza B by PCR: NEGATIVE
SARS Coronavirus 2 by RT PCR: NEGATIVE

## 2019-11-12 LAB — GLUCOSE, CAPILLARY
Glucose-Capillary: 111 mg/dL — ABNORMAL HIGH (ref 70–99)
Glucose-Capillary: 235 mg/dL — ABNORMAL HIGH (ref 70–99)
Glucose-Capillary: 286 mg/dL — ABNORMAL HIGH (ref 70–99)
Glucose-Capillary: 288 mg/dL — ABNORMAL HIGH (ref 70–99)

## 2019-11-12 NOTE — Progress Notes (Signed)
PROGRESS NOTE    Susan House  GYK:[id number] DOB: 1955/11/22 DOA: 11/09/2019 PCP: Jerrol Banana., MD (Confirm with patient/family/NH records and if not entered, this HAS to be entered at [facility] point of entry. "No PCP" if truly none.)   Chief Complaint  Patient presents with  . Fall    Brief Narrative:  64 year old female with type 2 diabetes mellitus with peripheral neuropathy, CAD, overactive bladder, hypertension on multiple meds presented with mechanical fall and sustained multiple fracture including left wrist, sixth and seventh ribs and fracture of the right big toe.  Admitted for further management including pain control and physical therapy.   Assessment & Plan: Mechanical fall with closed nondisplaced fracture of the left ulnar styloid and right great toe Seen by orthopedics.  Recommend weightbearing as tolerated through her heel on the right leg.  Nonweightbearing on the left hand.  Okay to use a platform walker through the left forearm.  PT recommends wheelchair with cushion as she needs assistance to ambulate.  CAM walking boot for foot fracture. Follow-up with orthopedics in 5-7 days with repeat x-rays.  PT recommends SNF.  Patient was hesitant and wanted to go home but is needing a lot of assistance to get out of bed and to ambulate.  He finally agrees on going to SNF.  Referral sent.  Continue pain control with oxycodone and Toradol as needed.  Bowel regimen.    Active problems Altered bowel left hip fracture. Nondisplaced third through ninth lateral left rib fractures.  Continue pain control as above.  Incentive spirometry.  Small-to-moderate posterior pericardial effusion Seen on CT chest.  Will obtain 2D echo to evaluate further.  Diabetes mellitus with hyperglycemia and peripheral neuropathy Continue current dose of Lantus.  Will add Premeal aspart 3 units 3 times daily.  Hold Metformin.  Continue gabapentin.  CAD Continue aspirin, Plavix, statin  and metoprolol.  No chest pain symptoms.  Essential hypertension On multiple meds including amlodipine, clonidine, hydralazine and metoprolol.  Continued.  Acute versus chronic kidney disease. No baseline kidney function in the system available.  Monitor for now.  Normocytic anemia No clear etiology.  Can be followed as outpatient.  No transfusion needed.  Overactive bladder Continue oxybutynin.  Generalized weakness Needs SNF.  DVT prophylaxis: Subcu Lovenox Code Status: DNR Family Communication: Husband at bedside Disposition:   Status is: Inpatient  Remains inpatient appropriate because:Ongoing active pain requiring inpatient pain management   Dispo: The patient is from: Home              Anticipated d/c is to: SNF              Anticipated d/c date is: 1 day              Patient currently is medically stable to d/c.  Awaiting insurance authorization for disposition to SNF.  Patient unsafe to be discharged home due to skilled care and PT requirement which cannot be safely provided at home.        Consultants:   Orthopedics  Procedures: CT head    Antimicrobials: None  Subjective: And examined.  Reports her pain controlled on current regimen but having difficulty getting out of bed independently  Objective: Vitals:   11/11/19 1539 11/11/19 2247 11/12/19 0827 11/12/19 1635  BP: (!) 112/51 125/64 (!) 152/59 (!) 156/61  Pulse: (!) 58 60 (!) 56 64  Resp: 17 16 16 14   Temp: 97.7 F (36.5 C) 99.1 F (37.3 C) 98 F (  36.7 C) 98.4 F (36.9 C)  TempSrc: Oral Oral  Oral  SpO2: 97% 97% 98% 99%  Weight:      Height:        Intake/Output Summary (Last 24 hours) at 11/12/2019 1655 Last data filed at 11/12/2019 1535 Gross per 24 hour  Intake 3841.43 ml  Output 2700 ml  Net 1141.43 ml   Filed Weights   11/09/19 2317 11/10/19 0445  Weight: 104.3 kg 104.2 kg    Examination:  General: Elderly female not in distress HEENT: Moist mucosa, supple neck Chest:  Clear bilaterally, pain on deep inspiration CVs: Normal S1-S2, no murmurs GI: Soft, nondistended, nontender Musculoskeletal: Ace wrap over left forearm, no edema     Data Reviewed: I have personally reviewed following labs and imaging studies  CBC: Recent Labs  Lab 11/10/19 0230 11/10/19 0457 11/11/19 0607 11/12/19 0504  WBC 12.8* 11.9* 7.2 6.2  NEUTROABS 10.0*  --   --   --   HGB 11.5* 11.5* 10.2* 9.5*  HCT 34.7* 35.8* 31.1* 28.2*  MCV 85.7 87.5 85.4 84.7  PLT 247 262 201 086    Basic Metabolic Panel: Recent Labs  Lab 11/10/19 0230 11/10/19 0457 11/11/19 0607 11/12/19 0504  NA 136 136 135 136  K 4.4 4.4 4.1 4.4  CL 104 104 108 108  CO2 21* 21* 21* 22  GLUCOSE 302* 311* 76 132*  BUN 26* 28* 31* 35*  CREATININE 1.36* 1.24* 1.51* 1.63*  CALCIUM 8.5* 8.3* 7.8* 7.5*    GFR: Estimated Creatinine Clearance: 40 mL/min (A) (by C-G formula based on SCr of 1.63 mg/dL (H)).  Liver Function Tests: No results for input(s): AST, ALT, ALKPHOS, BILITOT, PROT, ALBUMIN in the last 168 hours.  CBG: Recent Labs  Lab 11/11/19 1200 11/11/19 1648 11/11/19 2120 11/12/19 0822 11/12/19 1145  GLUCAP 194* 285* 247* 111* 235*     Recent Results (from the past 240 hour(s))  Respiratory Panel by RT PCR (Flu A&B, Covid) - Nasopharyngeal Swab     Status: None   Collection Time: 11/10/19  2:10 AM   Specimen: Nasopharyngeal Swab  Result Value Ref Range Status   SARS Coronavirus 2 by RT PCR NEGATIVE NEGATIVE Final    Comment: (NOTE) SARS-CoV-2 target nucleic acids are NOT DETECTED. The SARS-CoV-2 RNA is generally detectable in upper respiratoy specimens during the acute phase of infection. The lowest concentration of SARS-CoV-2 viral copies this assay can detect is 131 copies/mL. A negative result does not preclude SARS-Cov-2 infection and should not be used as the sole basis for treatment or other patient management decisions. A negative result may occur with  improper specimen  collection/handling, submission of specimen other than nasopharyngeal swab, presence of viral mutation(s) within the areas targeted by this assay, and inadequate number of viral copies (<131 copies/mL). A negative result must be combined with clinical observations, patient history, and epidemiological information. The expected result is Negative. Fact Sheet for Patients:  PinkCheek.be Fact Sheet for Healthcare Providers:  GravelBags.it This test is not yet ap proved or cleared by the [place] FDA and  has been authorized for detection and/or diagnosis of SARS-CoV-2 by FDA under an Emergency Use Authorization (EUA). This EUA will remain  in effect (meaning this test can be used) for the duration of the COVID-19 declaration under Section 564(b)(1) of the Act, 21 U.S.C. section 360bbb-3(b)(1), unless the authorization is terminated or revoked sooner.    Influenza A by PCR NEGATIVE NEGATIVE Final   Influenza B by PCR  NEGATIVE NEGATIVE Final    Comment: (NOTE) The Xpert Xpress SARS-CoV-2/FLU/RSV assay is intended as an aid in  the diagnosis of influenza from Nasopharyngeal swab specimens and  should not be used as a sole basis for treatment. Nasal washings and  aspirates are unacceptable for Xpert Xpress SARS-CoV-2/FLU/RSV  testing. Fact Sheet for Patients: PinkCheek.be Fact Sheet for Healthcare Providers: GravelBags.it This test is not yet approved or cleared by the [place] FDA and  has been authorized for detection and/or diagnosis of SARS-CoV-2 by  FDA under an Emergency Use Authorization (EUA). This EUA will remain  in effect (meaning this test can be used) for the duration of the  Covid-19 declaration under Section 564(b)(1) of the Act, 21  U.S.C. section 360bbb-3(b)(1), unless the authorization is  terminated or revoked. Performed at [facility],  [address] [place], [place] [address]   Urine Culture     Status: Abnormal   Collection Time: 11/10/19  2:30 AM   Specimen: Urine, Clean Catch  Result Value Ref Range Status   Specimen Description   Final    URINE, CLEAN CATCH Performed at [facility] [address] [place], [place] [address]    Special Requests   Final    Normal Performed at [facility], [address] [place], [place] [address]    Culture MULTIPLE SPECIES PRESENT, SUGGEST RECOLLECTION (A)  Final   Report Status 11/10/2019 FINAL  Final         Radiology Studies: No results found.      Scheduled Meds: . amLODipine  10 mg Oral Daily  . aspirin  81 mg Oral Daily  . atorvastatin  20 mg Oral Daily  . cloNIDine  0.3 mg Oral BID  . clopidogrel  75 mg Oral Daily  . docusate sodium  200 mg Oral BID  . enoxaparin (LOVENOX) injection  40 mg Subcutaneous BID  . gabapentin  200 mg Oral TID  . hydrALAZINE  100 mg Oral BID  . insulin aspart  0-15 Units Subcutaneous TID AC & HS  . insulin glargine  16 Units Subcutaneous Q2200  . levETIRAcetam  500 mg Oral BID  . metoprolol succinate  100 mg Oral Daily  . oxybutynin  5 mg Oral BID  . pantoprazole  40 mg Oral Daily  . sodium bicarbonate  650 mg Oral BID   Continuous Infusions: . sodium chloride 50 mL/hr at 11/12/19 1536     LOS: 1 day    Time spent: Wright, MD [facility]   To contact the attending provider between 7A-7P or the covering provider during after hours 7P-7A, please log into the web site [email or web address] and access using universal [place] password for that web site. If you do not have the password, please call the hospital operator.  11/12/2019, 4:55 PM

## 2019-11-12 NOTE — Progress Notes (Signed)
Physical Therapy Treatment Patient Details Name: Susan House MRN: [id number] DOB: 06/13/1956 Today's Date: 11/12/2019    History of Present Illness Susan House comes to [facility] 4/25 via EMS due to a fall. Pt hit her left side of the face on the door frame. Imaging revealing of left ulnar styloid fracture and Left ribs 6, 7 fractures (laterally), and Rt hallux distal phalanx fracture. Othorpedic consult recommends cam rocker and heel weight bearing on Rt foot and LUE nonweightbearing (platform walker ok to use.) PMH: HTN, PVD, CVA, migraine HA.    PT Comments    Pt in bed upon arrival, has had all BP meds and tylenol, pain 8/10 a bit worse this date, mostly Left ribs and Left face. Pt agreeable to participate, very motivated despite pain. Pt still requires minA from elevated surface to rise to standing (4x in session), lots of pain increase in standing but pt puts forth good effort. Pt AMB 3 intervals, ultimately limited in distance by author as HR quickly increased to 130bpm, although distance is not much shorter than self selected AMB 1DA. Pt has recovery of HR back to 80-90s over 2-3 minutes but PVC remain high- MD made aware who reports no indicated intervention at this time, but to monitor vitals and limit activity apporiately. Pt denies any CP, SOB, dizziness. Pt left up in chair at end of session.       Follow Up Recommendations  SNF;Supervision for mobility/OOB;Other (comment)     Equipment Recommendations  None recommended by PT    Recommendations for Other Services       Precautions / Restrictions Precautions Precautions: Fall Restrictions LUE Weight Bearing: Non weight bearing RLE Weight Bearing: Weight bearing as tolerated Other Position/Activity Restrictions: LUE splinted and to remain NWB. Per MD Pt OK to be WBAT through the heel of her RLE. Has post-op shoe for fxl mobility.    Mobility  Bed Mobility Overal bed mobility: Needs Assistance Bed Mobility:  Supine to Sit     Supine to sit: HOB elevated;Max assist     General bed mobility comments: more labored and pain limited this date.  Transfers Overall transfer level: Needs assistance Equipment used: Rolling walker (2 wheeled);Left platform walker Transfers: Sit to/from Stand Sit to Stand: Mod assist         General transfer comment: can perform with minA from elevated surface  Ambulation/Gait   Gait Distance (Feet): 12 Feet Assistive device: Left platform walker Gait Pattern/deviations: Step-to pattern     General Gait Details: 3-point gait, appears weak and labored; has bilat knee buckling each step which I suspect is baseline. Chair follow provided by husband.   Stairs             Wheelchair Mobility    Modified Rankin (Stroke Patients Only)       Balance                                            Cognition Arousal/Alertness: Awake/alert Behavior During Therapy: WFL for tasks assessed/performed Overall Cognitive Status: Within Functional Limits for tasks assessed                                        Exercises      General Comments  Pertinent Vitals/Pain Pain Score: 8  Pain Location: HA near Lt face; Left ribs and central chest, Right hallux Pain Descriptors / Indicators: Aching;Discomfort;Sore Pain Intervention(s): Limited activity within patient's tolerance;Monitored during session;Premedicated before session;Repositioned    Home Living                      Prior Function            PT Goals (current goals can now be found in the care plan section) Acute Rehab PT Goals Patient Stated Goal: return to home with help from husband PT Goal Formulation: With patient Time For Goal Achievement: 11/25/19 Potential to Achieve Goals: Poor Progress towards PT goals: Not progressing toward goals - comment    Frequency    7X/week      PT Plan Current plan remains appropriate;Equipment  recommendations need to be updated    Co-evaluation              AM-PAC PT "6 Clicks" Mobility   Outcome Measure  Help needed turning from your back to your side while in a flat bed without using bedrails?: A Little Help needed moving from lying on your back to sitting on the side of a flat bed without using bedrails?: A Little Help needed moving to and from a bed to a chair (including a wheelchair)?: A Lot Help needed standing up from a chair using your arms (e.g., wheelchair or bedside chair)?: A Lot Help needed to walk in hospital room?: A Lot Help needed climbing 3-5 steps with a railing? : Total 6 Click Score: 13    End of Session Equipment Utilized During Treatment: Gait belt(low placement, pt reports not near.) Activity Tolerance: Patient tolerated treatment well;Patient limited by fatigue;Patient limited by pain Patient left: in chair;with family/visitor present;with call bell/phone within reach Nurse Communication: Mobility status PT Visit Diagnosis: Unsteadiness on feet (R26.81);Other abnormalities of gait and mobility (R26.89);Difficulty in walking, not elsewhere classified (R26.2);Muscle weakness (generalized) (M62.81);History of falling (Z91.81);Ataxic gait (R26.0)     Time: 7544-9201 PT Time Calculation (min) (ACUTE ONLY): 35 min  Charges:  $Gait Training: 8-22 mins $Therapeutic Exercise: 8-22 mins                     1:18 PM, 11/12/19 Etta Grandchild, PT, DPT Physical Therapist - [facility]  [phone] ([place])    Demontre Padin C 11/12/2019, 1:10 PM

## 2019-11-12 NOTE — Progress Notes (Signed)
Occupational Therapy Treatment Patient Details Name: Susan House MRN: [id number] DOB: Jul 23, 1955 Today's Date: 11/12/2019    History of present illness Susan House 'Susan House comes to [facility] 4/25 via EMS due to a fall. Pt hit her left side of the face on the door frame. Imaging revealing of left ulnar styloid fracture and Left ribs 6, 7 fractures (laterally), and Rt hallux distal phalanx fracture. Othorpedic consult recommends cam rocker and heel weight bearing on Rt foot and LUE nonweightbearing (platform walker ok to use.) PMH: HTN, PVD, CVA, migraine HA.   OT comments  Susan House was seen for OT treatment on this date. Upon arrival to room pt sleeping soundly in room recliner with spouse at bedside. Pt wakes to verbal cues, and agreeable to OT tx session. Pt, caregiver, and provider problem solve strategies for management of ADLs in consideration of LUE NWB status. Pt and spouse educated on compensatory strategies for bathing, dressing, and self-feeding. Pt and spouse also educated on safe positioning of LUE to maximize safety and skin integrity this date. Pt reqests assistance with return to bed. OT facilitates functional transfer. Pt requires MOD A for STS from recliner as well as MIN A for stepping turn to EOB this date. MOD A for sit>sup t/f with assist primarily to lift BLE into bed this date. Pt making good progress toward goals and continues to benefit from skilled OT services to maximize return to PLOF and minimize risk of future falls, injury, caregiver burden, and readmission. Will continue to follow POC. Discharge recommendation remains appropriate.    Follow Up Recommendations  SNF    Equipment Recommendations  None recommended by OT    Recommendations for Other Services      Precautions / Restrictions Precautions Precautions: Fall Restrictions Weight Bearing Restrictions: Yes LUE Weight Bearing: Non weight bearing RLE Weight Bearing: Weight bearing as tolerated Other  Position/Activity Restrictions: LUE splinted and to remain NWB. Per MD Pt OK to be WBAT through the heel of her RLE. Has post-op shoe for fxl mobility.       Mobility Bed Mobility Overal bed mobility: Needs Assistance Bed Mobility: Sit to Supine     Supine to sit: HOB elevated;Max assist Sit to supine: Mod assist   General bed mobility comments: Pain limited, requries assist for mgt of BLE into bed  Transfers Overall transfer level: Needs assistance Equipment used: Rolling walker (2 wheeled);Left platform walker Transfers: Sit to/from Stand Sit to Stand: Mod assist         General transfer comment: can perform with minA from elevated surface    Balance Overall balance assessment: History of Falls;Needs assistance Sitting-balance support: Single extremity supported;Feet supported Sitting balance-Leahy Scale: Good     Standing balance support: During functional activity;Bilateral upper extremity supported Standing balance-Leahy Scale: Poor Standing balance comment: Unable to stand without BUE suported                           ADL either performed or assessed with clinical judgement   ADL Overall ADL's : Needs assistance/impaired                 Upper Body Dressing : Minimal assistance;Sitting Upper Body Dressing Details (indicate cue type and reason): w/ cueing for use of compensatory dressing strategies. Pt is RUE dominant which supports functional independence with this task. She continues to be limited, however, by bandages/splinting on LUE as well as LUE NWB status.  Toilet Transfer: RW;Minimal assistance;Moderate assistance;BSC;Stand-pivot   Toileting- Clothing Manipulation and Hygiene: Moderate assistance;With adaptive equipment;Sit to/from stand;Sitting/lateral lean       Functional mobility during ADLs: Moderate assistance;Cueing for sequencing;Cueing for safety General ADL Comments: Pt continues to be functionally limited by increased  pain, poor activity tolerance, and decreased functional use of her LUE/RLE.     Vision Baseline Vision/History: Wears glasses Wears Glasses: At all times Patient Visual Report: No change from baseline     Perception     Praxis      Cognition Arousal/Alertness: Awake/alert Behavior During Therapy: WFL for tasks assessed/performed Overall Cognitive Status: Within Functional Limits for tasks assessed                                          Exercises Other Exercises Other Exercises: Pt and caregiver educated on compensatory strategies for ADL management in consideration of LUE NWB status. OT also facilitates functional transfer back to bed this date with consistent cueing for safety and sequencing.   Shoulder Instructions       General Comments      Pertinent Vitals/ Pain       Pain Assessment: Faces Pain Score: 8  Faces Pain Scale: Hurts even more Pain Location: HA near Lt face; Left ribs and central chest, Right hallux Pain Descriptors / Indicators: Aching;Discomfort;Sore;Guarding;Grimacing Pain Intervention(s): Limited activity within patient's tolerance;Monitored during session;Premedicated before session;Repositioned  Home Living                                          Prior Functioning/Environment              Frequency  Min 2X/week        Progress Toward Goals  OT Goals(current goals can now be found in the care plan section)  Progress towards OT goals: Progressing toward goals  Acute Rehab OT Goals Patient Stated Goal: return to home with help from husband OT Goal Formulation: With patient Time For Goal Achievement: 11/25/19 Potential to Achieve Goals: [place] Discharge plan remains appropriate;Frequency remains appropriate    Co-evaluation                 AM-PAC OT "6 Clicks" Daily Activity     Outcome Measure   Help from another person eating meals?: A Little Help from another person taking  care of personal grooming?: A Little Help from another person toileting, which includes using toliet, bedpan, or urinal?: A Lot Help from another person bathing (including washing, rinsing, drying)?: A Lot Help from another person to put on and taking off regular upper body clothing?: A Little Help from another person to put on and taking off regular lower body clothing?: A Lot 6 Click Score: 15    End of Session Equipment Utilized During Treatment: Gait belt;Rolling walker  OT Visit Diagnosis: Other abnormalities of gait and mobility (R26.89);Pain Pain - Right/Left: (Both) Pain - part of body: Ankle and joints of foot;Arm;Hand   Activity Tolerance Patient tolerated treatment well   Patient Left in bed;with call bell/phone within reach;with bed alarm set;with family/visitor present   Nurse Communication Other (comment)(External catheter failed at end of session, pt noted to be wet. NT notified.)        Time: 6045-4098 OT Time Calculation (min):  31 min  Charges: OT General Charges $OT Visit: 1 Visit OT Treatments $Self Care/Home Management : 23-37 mins  Shara Blazing, M.S., OTR/L Ascom: [phone] 11/12/19, 3:23 PM
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
## 2019-11-12 NOTE — Progress Notes (Signed)
Inpatient Diabetes Program Recommendations  AACE/ADA: New Consensus Statement on Inpatient Glycemic Control   Target Ranges:  Prepandial:   less than 140 mg/dL      [facility] postprandial:   less than 180 mg/dL (1-2 hours)      Critically ill patients:  140 - 180 mg/dL   Results for Susan House, [name]" (MRN [id number]) as of 11/12/2019 11:03  Ref. Range 11/11/2019 07:41 11/11/2019 12:00 11/11/2019 16:48 11/11/2019 21:20 11/12/2019 08:22  Glucose-Capillary Latest Ref Range: 70 - 99 mg/dL 115 (H) 194 (H) 285 (H) 247 (H) 111 (H)   Review of Glycemic Control  Diabetes history: DM2 Outpatient Diabetes medications: Tresiba 30 units daily, Metformin 100 mg BID Current orders for Inpatient glycemic control: Lantus 16 units QHS, Novolog 0-15 units TID with meals and at bedtime  Inpatient Diabetes Program Recommendations:   Insulin - Meal Coverage: Please consider ordering Novolog 3 units TID with meals for meal coverage if patient eats at least 50% of meals.  Thanks, Barnie Alderman, RN, MSN, CDE Diabetes Coordinator Inpatient Diabetes Program [phone] (Team Pager from 8am to 5pm)

## 2019-11-12 NOTE — TOC Progression Note (Signed)
Transition of Care [facility]) - Progression Note    Patient Details  Name: Kelena Garrow MRN: [id number] Date of Birth: 1956-03-12  Transition of Care [facility] [place]) CM/SW [place], RN Phone Number: 11/12/2019, 10:38 AM  Clinical Narrative:     Spoke with the patient and reviewed the bed offers, she accepted the bed offer at [facility], I notified Tammy at [facility], I called [place] to start insurance [place] faxed clinical notes to [phone] Ref number [id number] Physician stated she is ready for DC today and he is getting rapid covid ordered  Expected Discharge Plan: [place] Barriers to Discharge: Continued Medical Work up  Expected Discharge Plan and Services Expected Discharge Plan: [place]   Discharge Planning Services: CM Consult   Living arrangements for the past 2 months: Single Family Home                 DME Arranged: N/A         HH Arranged: PT HH Agency: [place] ([place]) Date HH Agency Contacted: 11/11/19 Time [place]: [phone] Representative spoke with at [place]: [place] ([employer]) Interventions    Readmission Risk Interventions No flowsheet data found.

## 2019-11-12 NOTE — TOC Progression Note (Signed)
Transition of Care [facility]) - Progression Note    Patient Details  Name: Sham Alviar MRN: [id number] Date of Birth: [birth year]  Transition of Care [facility]) CM/SW [place], RN Phone Number: 11/12/2019, 8:44 AM  Clinical Narrative:    The patient has agreed to go to SNF as recommended, FL2, PASSR and bedsearch completed, will review bed offers once obtaiined   Expected Discharge Plan: [place] Barriers to Discharge: Continued Medical Work up  Expected Discharge Plan and Services Expected Discharge Plan: [place]   Discharge Planning Services: CM Consult   Living arrangements for the past 2 months: Single Family Home                 DME Arranged: N/A         HH Arranged: PT HH Agency: [place] ([place]) Date HH Agency Contacted: 11/11/19 Time [place]: [phone] Representative spoke with at [place]: [place] ([place]) Interventions    Readmission Risk Interventions No flowsheet data found.

## 2019-11-13 ENCOUNTER — Inpatient Hospital Stay (HOSPITAL_COMMUNITY)
Admit: 2019-11-13 | Discharge: 2019-11-13 | Disposition: A | Discharge status: Home / Self Care | Payer: Medicare Other | Attending: Internal Medicine | Admitting: Internal Medicine

## 2019-11-13 DIAGNOSIS — N1831 Chronic kidney disease, stage 3a: Secondary | ICD-10-CM

## 2019-11-13 DIAGNOSIS — S92424D Nondisplaced fracture of distal phalanx of right great toe, subsequent encounter for fracture with routine healing: Secondary | ICD-10-CM

## 2019-11-13 DIAGNOSIS — E0821 Diabetes mellitus due to underlying condition with diabetic nephropathy: Secondary | ICD-10-CM

## 2019-11-13 DIAGNOSIS — I313 Pericardial effusion (noninflammatory): Secondary | ICD-10-CM | POA: Diagnosis not present

## 2019-11-13 DIAGNOSIS — S2242XD Multiple fractures of ribs, left side, subsequent encounter for fracture with routine healing: Secondary | ICD-10-CM

## 2019-11-13 LAB — GLUCOSE, CAPILLARY
Glucose-Capillary: 161 mg/dL — ABNORMAL HIGH (ref 70–99)
Glucose-Capillary: 200 mg/dL — ABNORMAL HIGH (ref 70–99)

## 2019-11-13 LAB — ECHOCARDIOGRAM COMPLETE
Height: 62 in
Weight: 3675.51 oz

## 2019-11-13 MED ORDER — POLYETHYLENE GLYCOL 3350 17 G PO PACK: 17.0000 g | PACK | Freq: Every day | ORAL | 0 refills | Status: DC | PRN

## 2019-11-13 MED ORDER — INSULIN ASPART 100 UNIT/ML ~~LOC~~ SOLN
4.0000 [IU] | Freq: Three times a day (TID) | SUBCUTANEOUS | Status: DC
  Administered 2019-11-13 (×2): 4 [IU] via SUBCUTANEOUS
  Filled 2019-11-13 (×2): qty 1

## 2019-11-13 MED ORDER — INSULIN GLARGINE 100 UNIT/ML ~~LOC~~ SOLN
20.0000 [IU] | Freq: Every day | SUBCUTANEOUS | Status: DC
  Filled 2019-11-13: qty 0.2

## 2019-11-13 MED ORDER — HYDROCODONE-ACETAMINOPHEN 5-325 MG PO TABS: 2.0000 | ORAL_TABLET | Freq: Four times a day (QID) | ORAL | 0 refills | Status: DC | PRN

## 2019-11-13 NOTE — TOC Progression Note (Signed)
Transition of Care [facility]) - Progression Note    Patient Details  Name: Ilee Randleman MRN: [id number] Date of Birth: 02-Jul-1956  Transition of Care [facility]) CM/SW [place], RN Phone Number: 11/13/2019, 8:25 AM  Clinical Narrative:     Received a call from [facility] with insurance approval, Auth ID number [id number], ref number [id number], next review April 30th, Melanie Poteat is the coordinator, I notified Tammy at [facility] and the physician  Expected Discharge Plan: [place] Barriers to Discharge: Continued Medical Work up  Expected Discharge Plan and Services Expected Discharge Plan: [place]   Discharge Planning Services: CM Consult   Living arrangements for the past 2 months: Single Family Home                 DME Arranged: N/A         HH Arranged: PT HH Agency: [place] ([place]) Date HH Agency Contacted: 11/11/19 Time [place]: [phone] Representative spoke with at [place]: [place] ([place]) Interventions    Readmission Risk Interventions No flowsheet data found.

## 2019-11-13 NOTE — Progress Notes (Signed)
MD order received in [facility] to discharge pt home with home health; TOC previously established home health services with [facility]; DME/platform walker to be picked up at [facility] by the pt; verbally reviewed AVS with pt, gave Rx for Norco to the pt to get filled at her pharmacy; no questions voiced at this time; pt called her daughter and advised her to call her in the room in order for the staff to bring her out in a wheelchair

## 2019-11-13 NOTE — Discharge Instructions (Signed)
[place] to deliver a platform walker

## 2019-11-13 NOTE — Progress Notes (Signed)
*  PRELIMINARY RESULTS* Echocardiogram 2D Echocardiogram has been performed.  Susan House 11/13/2019, 9:06 AM
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
## 2019-11-13 NOTE — Discharge Summary (Signed)
Physician Discharge Summary  Susan House WUJ:[id number] DOB: 02-21-1956 DOA: 11/09/2019  PCP: Jerrol Banana., MD  Admit date: 11/09/2019 Discharge date: 11/13/2019  Admitted From: Home Disposition: Home  Recommendations for Outpatient Follow-up:  1. Follow up with PCP in 1 week 2. Follow-up with orthopedics Dr. Harlow Mares next week.   Home Health: PT/OT Equipment/Devices: Wheelchair with cushion, platform walker.  Discharge Condition: Fair CODE STATUS: DNR Diet recommendation: Heart Healthy / Carb Modified     Discharge Diagnoses:  Principal problem  Closed nondisplaced fracture of base of second metacarpal bone of left hand   Active Problems:   Fall, mechanical   Multiple closed fractures of ribs of left side   Closed nondisplaced fracture of distal phalanx of right great toe   Peripheral artery disease ([place])   Diabetes mellitus due to underlying condition with stage 3 chronic kidney disease, without long-term current use of insulin (HCC) History of CVA  Brief narrative/HPI Mechanical fall with closed nondisplaced fracture of the left ulnar styloid and right great toe Seen by orthopedics.  Recommend weightbearing as tolerated through her heel on the right leg.  Nonweightbearing on the left hand.  Okay to use a platform walker through the left forearm.  PT recommends wheelchair with cushion as she needs assistance to ambulate.  CAM walking boot for foot fracture.    Follow-up with orthopedics week with repeat x-rays.  PT recommended SNF, patient had agreed but now refused and wants to go home with home health (this has been arranged).  Continue pain control with Vicodin as needed upon discharge.  Prescription given.    Active problems Multiple rib fractures. Nondisplaced third through ninth lateral left rib fractures.  Continue pain control as above.  Incentive spirometry.  Small-to-moderate posterior pericardial effusion Seen on CT chest.     No chest pain symptoms or shortness of breath.  2D echo being done prior to discharge and can be followed as outpatient.  Diabetes mellitus type 2, uncontrolled with hyperglycemia on with long-term insulin, peripheral neuropathy  A1c of 10.  Continue home dose insulin and Metformin.  Outpatient follow-up.  CAD Continue aspirin, Plavix, statin and metoprolol.  No chest pain symptoms.  Essential hypertension On multiple meds including amlodipine, clonidine, hydralazine and metoprolol.  Continued.  Acute on chronic kidney disease stage IIIa Baseline creatinine 1 year back around 1.3.  Mildly worsened renal function.  Possibly related to diabetic nephropathy.  Patient is on Metformin and enalapril which can be continued and labs monitored closely as outpatient.   Normocytic anemia No clear etiology.  Can be followed as outpatient.  No transfusion needed.  Overactive bladder Continue oxybutynin.  Generalized weakness Needs SNF.  Procedure: CT head, cervical spine, maxillofacial, chest without contrast Family Communication:  Husband involved in care  Discharge Instructions   Allergies as of 11/13/2019      Reactions   Morphine And Related Anaphylaxis   Reglan [metoclopramide]    Other reaction(s): Unknown Elevated BP   Simvastatin Other (See Comments)   Muscle pain   Betadine [povidone Iodine] Rash   Tetracyclines & Related Rash   Other reaction(s): Unknown      Medication List    STOP taking these medications   amoxicillin-clavulanate 875-125 MG tablet Commonly known as: AUGMENTIN   ciprofloxacin 250 MG tablet Commonly known as: Cipro   HYDROcodone-acetaminophen 10-325 MG tablet Commonly known as: Norco Replaced by: HYDROcodone-acetaminophen 5-325 MG tablet   nitrofurantoin (macrocrystal-monohydrate) 100 MG capsule Commonly known as: MACROBID  traMADol-acetaminophen 37.5-325 MG tablet Commonly known as: Ultracet     TAKE these medications   Accu-Chek  Aviva Plus test strip Generic drug: glucose blood TEST BLOOD SUGAR THREE  TIMES DAILY   Accu-Chek Aviva Plus w/Device Kit Check sugar three times daily DX E11.9-needs a meter   accu-chek softclix lancets Check sugar three times daily DX E11.9   Accu-Chek Softclix Lancets lancets CHECK BLOOD SUGAR THREE TIMES DAILY   amLODipine 10 MG tablet Commonly known as: NORVASC TAKE 1 TABLET BY MOUTH  DAILY   aspirin 81 MG chewable tablet Chew 1 tablet (81 mg total) by mouth daily.   atorvastatin 20 MG tablet Commonly known as: LIPITOR TAKE 1 TABLET BY MOUTH  DAILY What changed: when to take this   cloNIDine 0.3 MG tablet Commonly known as: CATAPRES Take 1 tablet (0.3 mg total) by mouth 2 (two) times daily.   clopidogrel 75 MG tablet Commonly known as: PLAVIX TAKE 1 TABLET BY MOUTH  DAILY   enalapril 20 MG tablet Commonly known as: VASOTEC Take 1 tablet (20 mg total) by mouth 2 (two) times daily.   estradiol 0.1 MG/GM vaginal cream Commonly known as: ESTRACE APPLY A SMALL AMOUNT TOPICALLY TO URETHRA AREA AT BEDTIME DAILY FOR 1 WEEK THEN THREE TIMES A WEEK FOR 3 WEEKS   FreeStyle Libre 14 Day Reader Kerrin Mo LENGTH OF NEED: LIFETIME - UNLESS SPECIFIED OTHERWISE   FreeStyle Libre 14 Day Sensor Misc LENGTH OF NEED: LIFETIME - UNLESS SPECIFIED OTHERWISE   gabapentin 100 MG capsule Commonly known as: NEURONTIN Take 200 mg by mouth 2 (two) times daily.   hydrALAZINE 100 MG tablet Commonly known as: APRESOLINE Take 1 tablet (100 mg total) by mouth 2 (two) times daily.   HYDROcodone-acetaminophen 5-325 MG tablet Commonly known as: Norco Take 2 tablets by mouth every 6 (six) hours as needed for moderate pain. Replaces: HYDROcodone-acetaminophen 10-325 MG tablet   levETIRAcetam 500 MG tablet Commonly known as: KEPPRA TAKE 1 TABLET BY MOUTH 2  TIMES DAILY   metFORMIN 1000 MG tablet Commonly known as: GLUCOPHAGE TAKE 1 TABLET BY MOUTH TWO  TIMES DAILY   metoprolol succinate  100 MG 24 hr tablet Commonly known as: TOPROL-XL TAKE 1 TABLET BY MOUTH  DAILY   nitroGLYCERIN 0.4 MG SL tablet Commonly known as: NITROSTAT Place 1 tablet (0.4 mg total) under the tongue every 5 (five) minutes as needed for chest pain.   ondansetron 4 MG tablet Commonly known as: Zofran Take 1 tablet (4 mg total) by mouth every 8 (eight) hours as needed for nausea or vomiting.   oxybutynin 5 MG tablet Commonly known as: DITROPAN Take 1 tablet (5 mg total) by mouth 2 (two) times daily. What changed:   when to take this  reasons to take this   pantoprazole 40 MG tablet Commonly known as: PROTONIX TAKE 1 TABLET BY MOUTH  DAILY   polyethylene glycol 17 g packet Commonly known as: MiraLax Take 17 g by mouth daily as needed.   Tyler Aas FlexTouch 100 UNIT/ML FlexTouch Pen Generic drug: insulin degludec ADMINISTER UP TO 30 UNITS UNDER THE SKIN DAILY            Durable Medical Equipment  (From admission, onward)         Start     Ordered   11/13/19 1046  For home use only DME Walker rolling  Once    Comments: Platform walker for left side  Question Answer Comment  Walker: With [place]  Patient needs a walker to treat with the following condition Weakness      11/13/19 1046   11/12/19 0745  For home use only DME lightweight manual wheelchair with seat cushion  Once    Comments: Patient suffers from multiple fractures  which impairs their ability to perform daily activities like ADs and weightbearing in the home.  A walking aid will not resolve  issue with performing activities of daily living. A wheelchair will allow patient to safely perform daily activities. Patient is not able to propel themselves in the home using a standard weight wheelchair due to weakness and left hand fracture. Patient can self propel in the lightweight wheelchair. Length of need 4-6 weeks. Accessories: elevating leg rests (ELRs), wheel locks, extensions and anti-tippers.   11/12/19 0745           Contact information for follow-up providers    Jerrol Banana., MD. Schedule an appointment as soon as possible for a visit in 1 week(s).   Specialty: Family Medicine Contact information: [address] [place] [address] [phone]        Minna Merritts, MD .   Specialty: Cardiology Contact information: [place] [address] [phone]        Lovell Sheehan, MD. Schedule an appointment as soon as possible for a visit in 5 day(s).   Specialty: Orthopedic Surgery Contact information: [place] [address] [phone]            Contact information for after-discharge care    Destination    [facility] Preferred SNF .   Service: Skilled Nursing Contact information: [place] [phone]                 Allergies  Allergen Reactions  . Morphine And Related Anaphylaxis  . Reglan [Metoclopramide]     Other reaction(s): Unknown Elevated BP  . Simvastatin Other (See Comments)    Muscle pain  . Betadine [Povidone Iodine] Rash  . Tetracyclines & Related Rash    Other reaction(s): Unknown      Procedures/Studies: DG Ribs Unilateral W/Chest Left  Result Date: 11/10/2019 CLINICAL DATA:  Status post fall. EXAM: LEFT RIBS AND CHEST - 3+ VIEW COMPARISON:  Nov 29, 2018 FINDINGS: A nondisplaced lateral sixth and seventh left rib fracture is seen. Chronic deformities are seen involving the proximal left humerus and distal aspect of the right clavicle. There is no evidence of pneumothorax or pleural effusion. Both lungs are clear. Heart size and mediastinal contours are within normal limits. There is mild calcification of the aortic arch. Radiopaque surgical clips are seen within the right upper quadrant. IMPRESSION: Nondisplaced lateral sixth and seventh left rib fractures. Electronically Signed   By: Virgina Norfolk M.D.    On: 11/10/2019 00:27   DG Wrist Complete Left  Result Date: 11/10/2019 CLINICAL DATA:  Status post fall. EXAM: LEFT WRIST - COMPLETE 3+ VIEW COMPARISON:  February 25, 2007 FINDINGS: A small fracture of the left ulnar styloid is seen. A chronic fracture deformity is seen involving the distal left radius. This is present on the prior study. An ill-defined area of cortical irregularity is seen involving the proximal aspect of the second left metacarpal. Mild to moderate severity diffuse soft tissue swelling is seen. IMPRESSION: 1. Small fracture of the left ulnar styloid. 2. Additional findings suspicious for a nondisplaced fracture of the second left metacarpal. Electronically  Signed   By: Virgina Norfolk M.D.   On: 11/10/2019 00:31   CT Head Wo Contrast  Result Date: 11/10/2019 CLINICAL DATA:  Fall after hitting her left side of the face EXAM: CT HEAD WITHOUT CONTRAST TECHNIQUE: Contiguous axial images were obtained from the base of the skull through the vertex without intravenous contrast. COMPARISON:  September 25, 2018 FINDINGS: Brain: No evidence of acute territorial infarction, hemorrhage, hydrocephalus,extra-axial collection or mass lesion/mass effect. There is dilatation the ventricles and sulci consistent with age-related atrophy. Low-attenuation changes in the deep white matter consistent with small vessel ischemia. Again noted is a prior infarct involving the left frontal lobe with ex vacuo dilatation of the left lateral ventricle. Prior lacunar infarcts cervical within the bilateral basal ganglia. Vascular: No hyperdense vessel or unexpected calcification. Skull: The skull is intact. No fracture or focal lesion identified. Sinuses/Orbits: The visualized paranasal sinuses and mastoid air cells are clear. The orbits and globes intact. Other: There is a small soft tissue hematoma seen overlying the left frontal skull and periorbital region. Face: Osseous: No acute fracture or other significant osseous  abnormality.The nasal bone, mandibles, zygomatic arches and pterygoid plates are intact. Orbits: No fracture identified. Unremarkable appearance of globes and orbits. No retro-orbital collections are seen. Sinuses: The visualized paranasal sinuses and mastoid air cells are unremarkable. Soft tissues: Small soft tissue hematoma seen overlying the left frontal skull and periorbital region. Limited intracranial: No acute findings. Cervical spine: Alignment: There is straightening of the normal cervical lordosis. Skull base and vertebrae: Visualized skull base is intact. No atlanto-occipital dissociation. The vertebral body heights are well maintained. No fracture or pathologic osseous lesion seen. Soft tissues and spinal canal: The visualized paraspinal soft tissues are unremarkable. No prevertebral soft tissue swelling is seen. The spinal canal is grossly unremarkable, no large epidural collection or significant canal narrowing. Disc levels: Disc height loss with uncovertebral osteophytes and disc osteophyte complex is most notable at C5-C6 and C6-C7 with mild neural foraminal and canal narrowing. Upper chest: The lung apices are clear. Thoracic inlet is within normal limits. Other: Scattered carotid artery calcifications are seen. Aortic atherosclerosis is noted. For which IMPRESSION: No acute intracranial abnormality. Findings consistent with age related atrophy and chronic small vessel ischemia Prior lacunar infarcts as described above Soft tissue hematoma and swelling over the left frontal skull and periorbital region No acute facial fracture No acute fracture or malalignment of the spine. Electronically Signed   By: Prudencio Pair M.D.   On: 11/10/2019 00:07   CT Chest Wo Contrast  Result Date: 11/10/2019 CLINICAL DATA:  Status post trauma. EXAM: CT CHEST WITHOUT CONTRAST TECHNIQUE: Multidetector CT imaging of the chest was performed following the standard protocol without IV contrast. COMPARISON:  Nov 29, 2018  FINDINGS: Cardiovascular: There is marked severity calcification of the thoracic aorta. Normal heart size. A stable small to moderate sized posterior pericardial effusion is seen. There is marked severity coronary artery calcification. Mediastinum/Nodes: No enlarged mediastinal or axillary lymph nodes. Thyroid gland, trachea, and esophagus demonstrate no significant findings. Lungs/Pleura: Lungs are clear. No pleural effusion or pneumothorax. Upper Abdomen: Multiple surgical clips are seen within the gallbladder fossa. Musculoskeletal: Acute, nondisplaced third through ninth lateral left rib fractures are seen. A chronic deformity is seen along the distal aspect of the right clavicle. IMPRESSION: 1. Acute, nondisplaced third through ninth lateral left rib fractures. 2. Stable small to moderate sized posterior pericardial effusion. 3. Marked severity coronary artery calcification. Aortic Atherosclerosis (ICD10-I70.0). Electronically Signed  By: Virgina Norfolk M.D.   On: 11/10/2019 02:03   CT Cervical Spine Wo Contrast  Result Date: 11/10/2019 CLINICAL DATA:  Fall after hitting her left side of the face EXAM: CT HEAD WITHOUT CONTRAST TECHNIQUE: Contiguous axial images were obtained from the base of the skull through the vertex without intravenous contrast. COMPARISON:  September 25, 2018 FINDINGS: Brain: No evidence of acute territorial infarction, hemorrhage, hydrocephalus,extra-axial collection or mass lesion/mass effect. There is dilatation the ventricles and sulci consistent with age-related atrophy. Low-attenuation changes in the deep white matter consistent with small vessel ischemia. Again noted is a prior infarct involving the left frontal lobe with ex vacuo dilatation of the left lateral ventricle. Prior lacunar infarcts cervical within the bilateral basal ganglia. Vascular: No hyperdense vessel or unexpected calcification. Skull: The skull is intact. No fracture or focal lesion identified.  Sinuses/Orbits: The visualized paranasal sinuses and mastoid air cells are clear. The orbits and globes intact. Other: There is a small soft tissue hematoma seen overlying the left frontal skull and periorbital region. Face: Osseous: No acute fracture or other significant osseous abnormality.The nasal bone, mandibles, zygomatic arches and pterygoid plates are intact. Orbits: No fracture identified. Unremarkable appearance of globes and orbits. No retro-orbital collections are seen. Sinuses: The visualized paranasal sinuses and mastoid air cells are unremarkable. Soft tissues: Small soft tissue hematoma seen overlying the left frontal skull and periorbital region. Limited intracranial: No acute findings. Cervical spine: Alignment: There is straightening of the normal cervical lordosis. Skull base and vertebrae: Visualized skull base is intact. No atlanto-occipital dissociation. The vertebral body heights are well maintained. No fracture or pathologic osseous lesion seen. Soft tissues and spinal canal: The visualized paraspinal soft tissues are unremarkable. No prevertebral soft tissue swelling is seen. The spinal canal is grossly unremarkable, no large epidural collection or significant canal narrowing. Disc levels: Disc height loss with uncovertebral osteophytes and disc osteophyte complex is most notable at C5-C6 and C6-C7 with mild neural foraminal and canal narrowing. Upper chest: The lung apices are clear. Thoracic inlet is within normal limits. Other: Scattered carotid artery calcifications are seen. Aortic atherosclerosis is noted. For which IMPRESSION: No acute intracranial abnormality. Findings consistent with age related atrophy and chronic small vessel ischemia Prior lacunar infarcts as described above Soft tissue hematoma and swelling over the left frontal skull and periorbital region No acute facial fracture No acute fracture or malalignment of the spine. Electronically Signed   By: Prudencio Pair M.D.    On: 11/10/2019 00:07   DG Toe Great Right  Result Date: 11/10/2019 CLINICAL DATA:  Status post fall. EXAM: RIGHT GREAT TOE COMPARISON:  None. FINDINGS: A nondisplaced fracture deformity is seen extending through the distal phalanx of the right great toe. There is no evidence of dislocation. There is no evidence of arthropathy or other focal bone abnormality. Mild diffuse soft tissue swelling is seen. IMPRESSION: Nondisplaced fracture of the distal phalanx of the right great toe. Electronically Signed   By: Virgina Norfolk M.D.   On: 11/10/2019 00:24   CT Maxillofacial Wo Contrast  Result Date: 11/10/2019 CLINICAL DATA:  Fall after hitting her left side of the face EXAM: CT HEAD WITHOUT CONTRAST TECHNIQUE: Contiguous axial images were obtained from the base of the skull through the vertex without intravenous contrast. COMPARISON:  September 25, 2018 FINDINGS: Brain: No evidence of acute territorial infarction, hemorrhage, hydrocephalus,extra-axial collection or mass lesion/mass effect. There is dilatation the ventricles and sulci consistent with age-related atrophy. Low-attenuation changes in the  deep white matter consistent with small vessel ischemia. Again noted is a prior infarct involving the left frontal lobe with ex vacuo dilatation of the left lateral ventricle. Prior lacunar infarcts cervical within the bilateral basal ganglia. Vascular: No hyperdense vessel or unexpected calcification. Skull: The skull is intact. No fracture or focal lesion identified. Sinuses/Orbits: The visualized paranasal sinuses and mastoid air cells are clear. The orbits and globes intact. Other: There is a small soft tissue hematoma seen overlying the left frontal skull and periorbital region. Face: Osseous: No acute fracture or other significant osseous abnormality.The nasal bone, mandibles, zygomatic arches and pterygoid plates are intact. Orbits: No fracture identified. Unremarkable appearance of globes and orbits. No  retro-orbital collections are seen. Sinuses: The visualized paranasal sinuses and mastoid air cells are unremarkable. Soft tissues: Small soft tissue hematoma seen overlying the left frontal skull and periorbital region. Limited intracranial: No acute findings. Cervical spine: Alignment: There is straightening of the normal cervical lordosis. Skull base and vertebrae: Visualized skull base is intact. No atlanto-occipital dissociation. The vertebral body heights are well maintained. No fracture or pathologic osseous lesion seen. Soft tissues and spinal canal: The visualized paraspinal soft tissues are unremarkable. No prevertebral soft tissue swelling is seen. The spinal canal is grossly unremarkable, no large epidural collection or significant canal narrowing. Disc levels: Disc height loss with uncovertebral osteophytes and disc osteophyte complex is most notable at C5-C6 and C6-C7 with mild neural foraminal and canal narrowing. Upper chest: The lung apices are clear. Thoracic inlet is within normal limits. Other: Scattered carotid artery calcifications are seen. Aortic atherosclerosis is noted. For which IMPRESSION: No acute intracranial abnormality. Findings consistent with age related atrophy and chronic small vessel ischemia Prior lacunar infarcts as described above Soft tissue hematoma and swelling over the left frontal skull and periorbital region No acute facial fracture No acute fracture or malalignment of the spine. Electronically Signed   By: Prudencio Pair M.D.   On: 11/10/2019 00:07     Subjective:   Discharge Exam: Vitals:   11/12/19 2359 11/13/19 0809  BP: (!) 177/55 (!) 176/66  Pulse: 69 79  Resp: 18 16  Temp:  99.3 F (37.4 C)  SpO2: 98% 97%   Vitals:   11/12/19 0827 11/12/19 1635 11/12/19 2359 11/13/19 0809  BP: (!) 152/59 (!) 156/61 (!) 177/55 (!) 176/66  Pulse: (!) 56 64 69 79  Resp: _0 Temp: 98 F (36.7 C) 98.4 F (36.9 C)  99.3 F (37.4 C)  TempSrc:  Oral  Oral   SpO2: 98% 99% 98% 97%  Weight:      Height:        General: Middle-aged female not in distress HEENT: Contusion over left periorbital area, moist mucosa Chest: Clear to auscultation bilaterally, CVs: Normal S1-S2, no murmurs GI: Soft, nondistended, nontender Musculoskeletal: Dressing over right hand, no edema     The results of significant diagnostics from this hospitalization (including imaging, microbiology, ancillary and laboratory) are listed below for reference.     Microbiology: Recent Results (from the past 240 hour(s))  Respiratory Panel by RT PCR (Flu A&B, Covid) - Nasopharyngeal Swab     Status: None   Collection Time: 11/10/19  2:10 AM   Specimen: Nasopharyngeal Swab  Result Value Ref Range Status   SARS Coronavirus 2 by RT PCR NEGATIVE NEGATIVE Final    Comment: (NOTE) SARS-CoV-2 target nucleic acids are NOT DETECTED. The SARS-CoV-2 RNA is generally detectable in upper respiratoy specimens during the  acute phase of infection. The lowest concentration of SARS-CoV-2 viral copies this assay can detect is 131 copies/mL. A negative result does not preclude SARS-Cov-2 infection and should not be used as the sole basis for treatment or other patient management decisions. A negative result may occur with  improper specimen collection/handling, submission of specimen other than nasopharyngeal swab, presence of viral mutation(s) within the areas targeted by this assay, and inadequate number of viral copies (<131 copies/mL). A negative result must be combined with clinical observations, patient history, and epidemiological information. The expected result is Negative. Fact Sheet for Patients:  PinkCheek.be Fact Sheet for Healthcare Providers:  GravelBags.it This test is not yet ap proved or cleared by the [place] FDA and  has been authorized for detection and/or diagnosis of SARS-CoV-2 by FDA under an  Emergency Use Authorization (EUA). This EUA will remain  in effect (meaning this test can be used) for the duration of the COVID-19 declaration under Section 564(b)(1) of the Act, 21 U.S.C. section 360bbb-3(b)(1), unless the authorization is terminated or revoked sooner.    Influenza A by PCR NEGATIVE NEGATIVE Final   Influenza B by PCR NEGATIVE NEGATIVE Final    Comment: (NOTE) The Xpert Xpress SARS-CoV-2/FLU/RSV assay is intended as an aid in  the diagnosis of influenza from Nasopharyngeal swab specimens and  should not be used as a sole basis for treatment. Nasal washings and  aspirates are unacceptable for Xpert Xpress SARS-CoV-2/FLU/RSV  testing. Fact Sheet for Patients: PinkCheek.be Fact Sheet for Healthcare Providers: GravelBags.it This test is not yet approved or cleared by the [place] FDA and  has been authorized for detection and/or diagnosis of SARS-CoV-2 by  FDA under an Emergency Use Authorization (EUA). This EUA will remain  in effect (meaning this test can be used) for the duration of the  Covid-19 declaration under Section 564(b)(1) of the Act, 21  U.S.C. section 360bbb-3(b)(1), unless the authorization is  terminated or revoked. Performed at [facility], [address], [place], [place] [address]   Urine Culture     Status: Abnormal   Collection Time: 11/10/19  2:30 AM   Specimen: Urine, Clean Catch  Result Value Ref Range Status   Specimen Description   Final    URINE, CLEAN CATCH Performed at [facility], [address] [place], [place] [address]    Special Requests   Final    Normal Performed at [facility], [address] [place], [place] [address]    Culture MULTIPLE SPECIES PRESENT, SUGGEST RECOLLECTION (A)  Final   Report Status 11/10/2019 FINAL  Final  Respiratory Panel by RT PCR (Flu A&B, Covid) - Nasopharyngeal Swab     Status: None   Collection Time:  11/12/19  4:07 PM   Specimen: Nasopharyngeal Swab  Result Value Ref Range Status   SARS Coronavirus 2 by RT PCR NEGATIVE NEGATIVE Final    Comment: (NOTE) SARS-CoV-2 target nucleic acids are NOT DETECTED. The SARS-CoV-2 RNA is generally detectable in upper respiratoy specimens during the acute phase of infection. The lowest concentration of SARS-CoV-2 viral copies this assay can detect is 131 copies/mL. A negative result does not preclude SARS-Cov-2 infection and should not be used as the sole basis for treatment or other patient management decisions. A negative result may occur with  improper specimen collection/handling, submission of specimen other than nasopharyngeal swab, presence of viral mutation(s) within the areas targeted by this assay, and inadequate number of viral copies (<131 copies/mL). A negative result  must be combined with clinical observations, patient history, and epidemiological information. The expected result is Negative. Fact Sheet for Patients:  PinkCheek.be Fact Sheet for Healthcare Providers:  GravelBags.it This test is not yet ap proved or cleared by the [place] FDA and  has been authorized for detection and/or diagnosis of SARS-CoV-2 by FDA under an Emergency Use Authorization (EUA). This EUA will remain  in effect (meaning this test can be used) for the duration of the COVID-19 declaration under Section 564(b)(1) of the Act, 21 U.S.C. section 360bbb-3(b)(1), unless the authorization is terminated or revoked sooner.    Influenza A by PCR NEGATIVE NEGATIVE Final   Influenza B by PCR NEGATIVE NEGATIVE Final    Comment: (NOTE) The Xpert Xpress SARS-CoV-2/FLU/RSV assay is intended as an aid in  the diagnosis of influenza from Nasopharyngeal swab specimens and  should not be used as a sole basis for treatment. Nasal washings and  aspirates are unacceptable for Xpert Xpress SARS-CoV-2/FLU/RSV   testing. Fact Sheet for Patients: PinkCheek.be Fact Sheet for Healthcare Providers: GravelBags.it This test is not yet approved or cleared by the [place] FDA and  has been authorized for detection and/or diagnosis of SARS-CoV-2 by  FDA under an Emergency Use Authorization (EUA). This EUA will remain  in effect (meaning this test can be used) for the duration of the  Covid-19 declaration under Section 564(b)(1) of the Act, 21  U.S.C. section 360bbb-3(b)(1), unless the authorization is  terminated or revoked. Performed at [facility], [address] [place], [place] [address]      Labs: BNP (last 3 results) Recent Labs    11/29/18 1636  BNP 098.1*   Basic Metabolic Panel: Recent Labs  Lab 11/10/19 0230 11/10/19 0457 11/11/19 0607 11/12/19 0504  NA 136 136 135 136  K 4.4 4.4 4.1 4.4  CL 104 104 108 108  CO2 21* 21* 21* 22  GLUCOSE 302* 311* 76 132*  BUN 26* 28* 31* 35*  CREATININE 1.36* 1.24* 1.51* 1.63*  CALCIUM 8.5* 8.3* 7.8* 7.5*   Liver Function Tests: No results for input(s): AST, ALT, ALKPHOS, BILITOT, PROT, ALBUMIN in the last 168 hours. No results for input(s): LIPASE, AMYLASE in the last 168 hours. No results for input(s): AMMONIA in the last 168 hours. CBC: Recent Labs  Lab 11/10/19 0230 11/10/19 0457 11/11/19 0607 11/12/19 0504  WBC 12.8* 11.9* 7.2 6.2  NEUTROABS 10.0*  --   --   --   HGB 11.5* 11.5* 10.2* 9.5*  HCT 34.7* 35.8* 31.1* 28.2*  MCV 85.7 87.5 85.4 84.7  PLT 247 262 201 182   Cardiac Enzymes: No results for input(s): CKTOTAL, CKMB, CKMBINDEX, TROPONINI in the last 168 hours. BNP: Invalid input(s): POCBNP CBG: Recent Labs  Lab 11/12/19 0822 11/12/19 1145 11/12/19 1722 11/12/19 2154 11/13/19 0810  GLUCAP 111* 235* 286* 288* 161*   D-Dimer No results for input(s): DDIMER in the last 72 hours. Hgb A1c No results for input(s): HGBA1C in the last 72  hours. Lipid Profile No results for input(s): CHOL, HDL, LDLCALC, TRIG, CHOLHDL, LDLDIRECT in the last 72 hours. Thyroid function studies No results for input(s): TSH, T4TOTAL, T3FREE, THYROIDAB in the last 72 hours.  Invalid input(s): FREET3 Anemia work up No results for input(s): VITAMINB12, FOLATE, FERRITIN, TIBC, IRON, RETICCTPCT in the last 72 hours. Urinalysis    Component Value Date/Time   COLORURINE YELLOW 11/10/2019 0230   APPEARANCEUR CLEAR 11/10/2019 0230   APPEARANCEUR Turbid (A) 05/25/2016 1122   LABSPEC  1.020 11/10/2019 0230   LABSPEC 1.014 01/17/2014 1708   PHURINE 5.5 11/10/2019 0230   GLUCOSEU 500 (A) 11/10/2019 0230   GLUCOSEU >=500 01/17/2014 1708   HGBUR NEGATIVE 11/10/2019 0230   BILIRUBINUR NEGATIVE 11/10/2019 0230   BILIRUBINUR Negative 10/30/2019 1632   BILIRUBINUR Negative 05/25/2016 1122   BILIRUBINUR Negative 01/17/2014 1708   KETONESUR NEGATIVE 11/10/2019 0230   PROTEINUR >300 (A) 11/10/2019 0230   UROBILINOGEN 0.2 10/30/2019 1632   UROBILINOGEN 0.2 01/15/2009 0431   NITRITE NEGATIVE 11/10/2019 0230   LEUKOCYTESUR SMALL (A) 11/10/2019 0230   LEUKOCYTESUR 2+ 01/17/2014 1708   Sepsis Labs Invalid input(s): PROCALCITONIN,  WBC,  LACTICIDVEN Microbiology Recent Results (from the past 240 hour(s))  Respiratory Panel by RT PCR (Flu A&B, Covid) - Nasopharyngeal Swab     Status: None   Collection Time: 11/10/19  2:10 AM   Specimen: Nasopharyngeal Swab  Result Value Ref Range Status   SARS Coronavirus 2 by RT PCR NEGATIVE NEGATIVE Final    Comment: (NOTE) SARS-CoV-2 target nucleic acids are NOT DETECTED. The SARS-CoV-2 RNA is generally detectable in upper respiratoy specimens during the acute phase of infection. The lowest concentration of SARS-CoV-2 viral copies this assay can detect is 131 copies/mL. A negative result does not preclude SARS-Cov-2 infection and should not be used as the sole basis for treatment or other patient management  decisions. A negative result may occur with  improper specimen collection/handling, submission of specimen other than nasopharyngeal swab, presence of viral mutation(s) within the areas targeted by this assay, and inadequate number of viral copies (<131 copies/mL). A negative result must be combined with clinical observations, patient history, and epidemiological information. The expected result is Negative. Fact Sheet for Patients:  PinkCheek.be Fact Sheet for Healthcare Providers:  GravelBags.it This test is not yet ap proved or cleared by the [place] FDA and  has been authorized for detection and/or diagnosis of SARS-CoV-2 by FDA under an Emergency Use Authorization (EUA). This EUA will remain  in effect (meaning this test can be used) for the duration of the COVID-19 declaration under Section 564(b)(1) of the Act, 21 U.S.C. section 360bbb-3(b)(1), unless the authorization is terminated or revoked sooner.    Influenza A by PCR NEGATIVE NEGATIVE Final   Influenza B by PCR NEGATIVE NEGATIVE Final    Comment: (NOTE) The Xpert Xpress SARS-CoV-2/FLU/RSV assay is intended as an aid in  the diagnosis of influenza from Nasopharyngeal swab specimens and  should not be used as a sole basis for treatment. Nasal washings and  aspirates are unacceptable for Xpert Xpress SARS-CoV-2/FLU/RSV  testing. Fact Sheet for Patients: PinkCheek.be Fact Sheet for Healthcare Providers: GravelBags.it This test is not yet approved or cleared by the [place] FDA and  has been authorized for detection and/or diagnosis of SARS-CoV-2 by  FDA under an Emergency Use Authorization (EUA). This EUA will remain  in effect (meaning this test can be used) for the duration of the  Covid-19 declaration under Section 564(b)(1) of the Act, 21  U.S.C. section 360bbb-3(b)(1), unless the authorization  is  terminated or revoked. Performed at [facility], [address], [place], [place] [address]   Urine Culture     Status: Abnormal   Collection Time: 11/10/19  2:30 AM   Specimen: Urine, Clean Catch  Result Value Ref Range Status   Specimen Description   Final    URINE, CLEAN CATCH Performed at [facility], [address] [place],  [address]    Special  Requests   Final    Normal Performed at [facility], [address] [place], [place] [address]    Culture MULTIPLE SPECIES PRESENT, SUGGEST RECOLLECTION (A)  Final   Report Status 11/10/2019 FINAL  Final  Respiratory Panel by RT PCR (Flu A&B, Covid) - Nasopharyngeal Swab     Status: None   Collection Time: 11/12/19  4:07 PM   Specimen: Nasopharyngeal Swab  Result Value Ref Range Status   SARS Coronavirus 2 by RT PCR NEGATIVE NEGATIVE Final    Comment: (NOTE) SARS-CoV-2 target nucleic acids are NOT DETECTED. The SARS-CoV-2 RNA is generally detectable in upper respiratoy specimens during the acute phase of infection. The lowest concentration of SARS-CoV-2 viral copies this assay can detect is 131 copies/mL. A negative result does not preclude SARS-Cov-2 infection and should not be used as the sole basis for treatment or other patient management decisions. A negative result may occur with  improper specimen collection/handling, submission of specimen other than nasopharyngeal swab, presence of viral mutation(s) within the areas targeted by this assay, and inadequate number of viral copies (<131 copies/mL). A negative result must be combined with clinical observations, patient history, and epidemiological information. The expected result is Negative. Fact Sheet for Patients:  PinkCheek.be Fact Sheet for Healthcare Providers:  GravelBags.it This test is not yet ap proved or cleared by the [place] FDA and  has been authorized  for detection and/or diagnosis of SARS-CoV-2 by FDA under an Emergency Use Authorization (EUA). This EUA will remain  in effect (meaning this test can be used) for the duration of the COVID-19 declaration under Section 564(b)(1) of the Act, 21 U.S.C. section 360bbb-3(b)(1), unless the authorization is terminated or revoked sooner.    Influenza A by PCR NEGATIVE NEGATIVE Final   Influenza B by PCR NEGATIVE NEGATIVE Final    Comment: (NOTE) The Xpert Xpress SARS-CoV-2/FLU/RSV assay is intended as an aid in  the diagnosis of influenza from Nasopharyngeal swab specimens and  should not be used as a sole basis for treatment. Nasal washings and  aspirates are unacceptable for Xpert Xpress SARS-CoV-2/FLU/RSV  testing. Fact Sheet for Patients: PinkCheek.be Fact Sheet for Healthcare Providers: GravelBags.it This test is not yet approved or cleared by the [place] FDA and  has been authorized for detection and/or diagnosis of SARS-CoV-2 by  FDA under an Emergency Use Authorization (EUA). This EUA will remain  in effect (meaning this test can be used) for the duration of the  Covid-19 declaration under Section 564(b)(1) of the Act, 21  U.S.C. section 360bbb-3(b)(1), unless the authorization is  terminated or revoked. Performed at [facility], [address], [place],  [address]      Time coordinating discharge: 35 minutes  SIGNED:   Louellen Molder, MD  [facility] 11/13/2019, 11:22 AM Pager   If 7PM-7AM, please contact night-coverage [email or web address] Password [email or web address]

## 2019-11-13 NOTE — Plan of Care (Signed)

## 2019-11-13 NOTE — TOC Progression Note (Addendum)
Transition of Care [facility]) - Progression Note    Patient Details  Name: Kamile Fassler MRN: [id number] Date of Birth: 01/06/1956  Transition of Care [facility]) CM/SW [place], RN Phone Number: 11/13/2019, 10:23 AM  Clinical Narrative:    Spoke with the patient this morning, She decided she wants to go home and not to rehab, She will need HH and is set up with [facility], She need a Platform walker I called Brad with [employer] to arrange the Platform walker, he is checking to see if they have any and will let me know He said that they have them at the retail store and he is going to call the husband to have him pick it up  Expected Discharge Plan: [place] Barriers to Discharge: Continued Medical Work up  Expected Discharge Plan and Services Expected Discharge Plan: [place]   Discharge Planning Services: CM Consult   Living arrangements for the past 2 months: Single Family Home                 DME Arranged: N/A         HH Arranged: PT HH Agency: [place] ([place]) Date HH Agency Contacted: 11/11/19 Time [place]: [phone] Representative spoke with at [place]: [place] ([place]) Interventions    Readmission Risk Interventions No flowsheet data found.

## 2019-11-13 NOTE — Progress Notes (Signed)
Pt's ride present at the [facility] entrance; pt discharged via wheelchair by nursing to the medical mall entrance 

## 2019-11-14 ENCOUNTER — Telehealth: Payer: Self-pay

## 2019-11-14 NOTE — Progress Notes (Signed)
Established patient visit  I,April Miller,acting as a scribe for Wilhemena Durie, MD.,have documented all relevant documentation on the behalf of Wilhemena Durie, MD,as directed by  Wilhemena Durie, MD while in the presence of Wilhemena Durie, MD.   Patient: Susan House   DOB: February 24, 1956   64 y.o. Female  MRN: [id number] Visit Date: 11/18/2019  Today's healthcare provider: Wilhemena Durie, MD   Chief Complaint  Patient presents with  . Hospitalization Follow-up   Subjective    HPI Follow up Hospitalization This is a transition of care visit after patient suffered a mechanical fall at home and suffered nondisplaced third through ninth rib fractures on the left.  She is still having pain but trying to do incentive spirometry.  She also suffered a nondisplaced distal phalanx fracture of the right great toe fracture of the right foot, she has a boot on.  She also suffered a ulnar styloid fracture of the left wrist.  Nondisplaced possible nondisplaced fracture of the second left metacarpal.  Brace on left hand and forearm. Blood sugars are little bit high over the past couple of days.  She has no cough fever or dysuria. Patient was [facility] to [facility] on 4025/2021 and discharged on 11/13/2019. She was treated for Fall. Treatment for this included; see notes in chart. Telephone follow up was done on 11/14/2019 She reports good compliance with treatment. She reports this condition is improved .  --------------------------------------------------------------------  Patient states she feels slightly better. Patient had brace put on left arm today. Patient is having a struggle with daily personal care and ambulation. Also patient's blood sugar has been elevated today at 340 and 543.   Past Medical History:  Diagnosis Date  . Allergy   . Arthritis   . Atypical chest pain    a. Reportedly nl cath in the past; b. 12/2009 MV: No ischemia/infarct. EF 87%.  . Carotid  arterial disease ([place])    a. 01/5169 CTA head/neck: LICA 01V, RICA 49-->[facility] rx.  . Cellulitis and abscess of face   . Chronic Dizziness   . Coronary artery calcification seen on CT scan    a. 10/2016 CTA chest: Atherosclerotic calcifications Ao, cors, and prox great vessels.  . Depression   . Diabetes mellitus ([place])   . Diastolic dysfunction    a. 2008 Echo: Nl EF; b. 2011 Echo: Nl EF; c. 06/2016 Echo: EF 55-60%, Gr1DD; d. 03/2018 Echo: EF 55-60%, no rwma, Gr1DD, mod dil LA.  . Edema   . GERD (gastroesophageal reflux disease)   . Headache   . Hematuria   . Hyperlipidemia   . Hypertension   . IBS (irritable bowel syndrome)   . Migraine   . Morbid obesity ([place])   . Nocturia   . Possible Seizure ([place])    a. 10/2016 ? sz. MRI neg for stroke.  . Stroke ([place]) 2000  . TIA (transient ischemic attack) 2010 & 2019   a. 2010; b. 03/2018 Left sided wkns-->MRI neg for stroke. CTA head/neck: Right A2 cut off/occlusion w/ collats extending over R cerebral convexity. LICA 67R, RICA 45, RSubclavian 50->med rx.  . Urgency of micturation   . Urinary frequency   . Urinary incontinence        Medications: Outpatient Medications Prior to Visit  Medication Sig  . ACCU-CHEK AVIVA PLUS test strip TEST BLOOD SUGAR THREE  TIMES DAILY  . ACCU-CHEK SOFTCLIX LANCETS lancets CHECK BLOOD SUGAR THREE TIMES DAILY  . amLODipine (  NORVASC) 10 MG tablet TAKE 1 TABLET BY MOUTH  DAILY  . aspirin 81 MG chewable tablet Chew 1 tablet (81 mg total) by mouth daily.  Marland Kitchen atorvastatin (LIPITOR) 20 MG tablet TAKE 1 TABLET BY MOUTH  DAILY (Patient taking differently: Take 20 mg by mouth at bedtime. )  . Blood Glucose Monitoring Suppl (ACCU-CHEK AVIVA PLUS) w/Device KIT Check sugar three times daily DX E11.9-needs a meter  . cloNIDine (CATAPRES) 0.3 MG tablet Take 1 tablet (0.3 mg total) by mouth 2 (two) times daily.  . clopidogrel (PLAVIX) 75 MG tablet TAKE 1 TABLET BY MOUTH  DAILY  . enalapril (VASOTEC) 20 MG tablet Take 1  tablet (20 mg total) by mouth 2 (two) times daily.  Marland Kitchen gabapentin (NEURONTIN) 100 MG capsule Take 200 mg by mouth 2 (two) times daily.   . hydrALAZINE (APRESOLINE) 100 MG tablet Take 1 tablet (100 mg total) by mouth 2 (two) times daily.  Elmore Guise Devices (ACCU-CHEK SOFTCLIX) lancets Check sugar three times daily DX E11.9  . levETIRAcetam (KEPPRA) 500 MG tablet TAKE 1 TABLET BY MOUTH 2  TIMES DAILY  . metFORMIN (GLUCOPHAGE) 1000 MG tablet TAKE 1 TABLET BY MOUTH TWO  TIMES DAILY  . metoprolol succinate (TOPROL-XL) 100 MG 24 hr tablet TAKE 1 TABLET BY MOUTH  DAILY  . nitroGLYCERIN (NITROSTAT) 0.4 MG SL tablet Place 1 tablet (0.4 mg total) under the tongue every 5 (five) minutes as needed for chest pain.  Marland Kitchen ondansetron (ZOFRAN) 4 MG tablet Take 1 tablet (4 mg total) by mouth every 8 (eight) hours as needed for nausea or vomiting.  Marland Kitchen oxybutynin (DITROPAN) 5 MG tablet Take 1 tablet (5 mg total) by mouth 2 (two) times daily. (Patient taking differently: Take 5 mg by mouth 2 (two) times daily as needed. )  . pantoprazole (PROTONIX) 40 MG tablet TAKE 1 TABLET BY MOUTH  DAILY  . Continuous Blood Gluc Receiver (FREESTYLE LIBRE 14 DAY READER) DEVI LENGTH OF NEED: LIFETIME - UNLESS SPECIFIED OTHERWISE (Patient not taking: Reported on 11/18/2019)  . Continuous Blood Gluc Sensor (FREESTYLE LIBRE 14 DAY SENSOR) MISC LENGTH OF NEED: LIFETIME - UNLESS SPECIFIED OTHERWISE (Patient not taking: Reported on 11/18/2019)  . estradiol (ESTRACE) 0.1 MG/GM vaginal cream APPLY A SMALL AMOUNT TOPICALLY TO URETHRA AREA AT BEDTIME DAILY FOR 1 WEEK THEN THREE TIMES A WEEK FOR 3 WEEKS  . HYDROcodone-acetaminophen (NORCO) 5-325 MG tablet Take 2 tablets by mouth every 6 (six) hours as needed for moderate pain. (Patient not taking: Reported on 11/18/2019)  . insulin degludec (TRESIBA FLEXTOUCH) 100 UNIT/ML SOPN FlexTouch Pen ADMINISTER UP TO 30 UNITS UNDER THE SKIN DAILY  . polyethylene glycol (MIRALAX) 17 g packet Take 17 g by mouth daily as  needed. (Patient not taking: Reported on 11/18/2019)   No facility-administered medications prior to visit.    Review of Systems  Constitutional: Negative for appetite change, chills, fatigue and fever.  HENT: Negative.   Eyes: Negative.   Respiratory: Negative for chest tightness and shortness of breath.   Cardiovascular: Negative for chest pain and palpitations.  Gastrointestinal: Negative for abdominal pain, nausea and vomiting.  Endocrine: Negative.   Musculoskeletal: Positive for arthralgias.  Allergic/Immunologic: Negative.   Neurological: Negative for dizziness and weakness.  Hematological: Negative.   Psychiatric/Behavioral: Negative.     Last hemoglobin A1c Lab Results  Component Value Date   HGBA1C 10.0 (H) 11/10/2019      Objective    BP (!) 146/70 (BP Location: Right Arm, Patient Position: Sitting,  Cuff Size: Large)   Pulse 67   Temp (!) 96.9 F (36.1 C) (Other (Comment))   Resp 18   SpO2 98%  BP Readings from Last 3 Encounters:  11/18/19 (!) 146/70  11/13/19 (!) 176/66  10/03/19 138/82   Wt Readings from Last 3 Encounters:  11/10/19 229 lb 11.5 oz (104.2 kg)  07/19/19 231 lb 7.7 oz (105 kg)  07/03/19 232 lb (105.2 kg)      Physical Exam Vitals reviewed.  Constitutional:      Appearance: She is obese.  HENT:     Head: Normocephalic.     Comments: She has bruising on the entire left side of her head and face but this is improving rapidly.    Right Ear: External ear normal.     Left Ear: External ear normal.  Eyes:     General: No scleral icterus.    Conjunctiva/sclera: Conjunctivae normal.     Pupils: Pupils are equal, round, and reactive to light.  Cardiovascular:     Rate and Rhythm: Normal rate and regular rhythm.     Heart sounds: Normal heart sounds.  Pulmonary:     Effort: Pulmonary effort is normal.     Breath sounds: Normal breath sounds.  Chest:     Chest wall: Tenderness present.  Abdominal:     Palpations: Abdomen is soft.    Skin:    General: Skin is warm and dry.  Neurological:     Mental Status: She is alert and oriented to person, place, and time. Mental status is at baseline.  Psychiatric:        Mood and Affect: Mood normal.        Behavior: Behavior normal.        Thought Content: Thought content normal.        Judgment: Judgment normal.       No results found for any visits on 11/18/19.  Assessment & Plan     1. 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  Patient is at high risk of mechanical falls.  Home health referral to see if there is any other help that she can get. - Ambulatory referral to Home Health  2. Diabetes mellitus due to underlying condition with stage 3 chronic kidney disease, without long-term current use of insulin, unspecified whether stage 3a or 3b CKD ([place])  - Ambulatory referral to Home Health  3. Essential hypertension  - Ambulatory referral to [place]  4. Weakness of limb  - Ambulatory referral to Home Health  5. Closed fracture of multiple ribs of left side, initial encounter Incentive spirometry and deep breathing daily.  Her pain seems to be improving.  6. Type III open nondisplaced fracture of styloid process of left ulna, initial encounter   7. Class 3 severe obesity due to excess calories with serious comorbidity and body mass index (BMI) of 40.0 to 44.9 in adult [facility]) Major issue in this patient with other comorbidities.  She has diabetes, arthritis, hypertension, hyperlipidemia, history of CVA   Return in about 3 months (around 02/18/2020).      I, Wilhemena Durie, MD, have reviewed all documentation for this visit. The documentation on 11/20/19 for the exam, diagnosis, procedures, and orders are all accurate and complete.    Jordann Grime [place]ford Mon, MD  [facility] [phone] (phone) [phone] (fax)  [place]

## 2019-11-14 NOTE — Telephone Encounter (Signed)
Transition Care Management Follow-up Telephone Call  Date of discharge and from where: [facility] 11/13/19  How have you been since you were released from the hospital? Pt states she is doing okay. Pain 8/10 prior to pain medication that she took approx 15-20 mins ago.   Any questions or concerns? No   Items Reviewed:  Did the pt receive and understand the discharge instructions provided? Yes   Medications obtained and verified? Yes   Any new allergies since your discharge? No   Dietary orders reviewed? Yes  Do you have support at home? Yes  - pt also states she has all necessary assistive devices for at home safety to prevent future falls.   Functional Questionnaire: (I = Independent and D = Dependent) ADLs: D  Bathing/Dressing- D  Meal Prep- D  Eating- I  Maintaining continence- I  Transferring/Ambulation- D - uses walker & arm brace  Managing Meds- I  Follow up appointments reviewed:   PCP Hospital f/u appt confirmed? Yes  Scheduled to see Dr. Rosanna Randy on 11/18/19 @ 3:00.  [facility] f/u appt confirmed? Yes  Scheduled to see Dr. Harlow Mares on 5/4 ortho surgeon & Dr. Rockey Situ 5/12.  Are transportation arrangements needed? No   If their condition worsens, is the pt aware to call PCP or go to the Emergency Dept.? Yes  Was the patient provided with contact information for the PCP's office or ED? Yes  Was to pt encouraged to call back with questions or concerns? Yes

## 2019-11-18 ENCOUNTER — Ambulatory Visit (INDEPENDENT_AMBULATORY_CARE_PROVIDER_SITE_OTHER): Payer: Medicare Other | Admitting: Family Medicine

## 2019-11-18 ENCOUNTER — Encounter: Payer: Self-pay | Admitting: Family Medicine

## 2019-11-18 ENCOUNTER — Other Ambulatory Visit: Payer: Self-pay

## 2019-11-18 VITALS — BP 146/70 | HR 67 | Temp 96.9°F | Resp 18

## 2019-11-18 DIAGNOSIS — Z6841 Body Mass Index (BMI) 40.0 and over, adult: Secondary | ICD-10-CM

## 2019-11-18 DIAGNOSIS — N183 Chronic kidney disease, stage 3 unspecified: Secondary | ICD-10-CM

## 2019-11-18 DIAGNOSIS — I69398 Other sequelae of cerebral infarction: Secondary | ICD-10-CM

## 2019-11-18 DIAGNOSIS — I10 Essential (primary) hypertension: Secondary | ICD-10-CM

## 2019-11-18 DIAGNOSIS — S52615C Nondisplaced fracture of left ulna styloid process, initial encounter for open fracture type IIIA, IIIB, or IIIC: Secondary | ICD-10-CM

## 2019-11-18 DIAGNOSIS — E0822 Diabetes mellitus due to underlying condition with diabetic chronic kidney disease: Secondary | ICD-10-CM | POA: Diagnosis not present

## 2019-11-18 DIAGNOSIS — S6292XA Unspecified fracture of left wrist and hand, initial encounter for closed fracture: Secondary | ICD-10-CM | POA: Diagnosis not present

## 2019-11-18 DIAGNOSIS — R269 Unspecified abnormalities of gait and mobility: Secondary | ICD-10-CM

## 2019-11-18 DIAGNOSIS — S92404A Nondisplaced unspecified fracture of right great toe, initial encounter for closed fracture: Secondary | ICD-10-CM | POA: Diagnosis not present

## 2019-11-18 DIAGNOSIS — R29898 Other symptoms and signs involving the musculoskeletal system: Secondary | ICD-10-CM

## 2019-11-18 DIAGNOSIS — S2242XA Multiple fractures of ribs, left side, initial encounter for closed fracture: Secondary | ICD-10-CM

## 2019-11-18 DIAGNOSIS — E66813 Obesity, class 3: Secondary | ICD-10-CM

## 2019-11-18 NOTE — Patient Instructions (Signed)
Check Tresiba dose.

## 2019-11-19 DIAGNOSIS — E1151 Type 2 diabetes mellitus with diabetic peripheral angiopathy without gangrene: Secondary | ICD-10-CM | POA: Diagnosis not present

## 2019-11-19 DIAGNOSIS — S52612D Displaced fracture of left ulna styloid process, subsequent encounter for closed fracture with routine healing: Secondary | ICD-10-CM | POA: Diagnosis not present

## 2019-11-19 DIAGNOSIS — Z9181 History of falling: Secondary | ICD-10-CM | POA: Diagnosis not present

## 2019-11-19 DIAGNOSIS — I2510 Atherosclerotic heart disease of native coronary artery without angina pectoris: Secondary | ICD-10-CM | POA: Diagnosis not present

## 2019-11-19 DIAGNOSIS — E785 Hyperlipidemia, unspecified: Secondary | ICD-10-CM | POA: Diagnosis not present

## 2019-11-19 DIAGNOSIS — S92421D Displaced fracture of distal phalanx of right great toe, subsequent encounter for fracture with routine healing: Secondary | ICD-10-CM | POA: Diagnosis not present

## 2019-11-19 DIAGNOSIS — Z8673 Personal history of transient ischemic attack (TIA), and cerebral infarction without residual deficits: Secondary | ICD-10-CM | POA: Diagnosis not present

## 2019-11-19 DIAGNOSIS — S2242XD Multiple fractures of ribs, left side, subsequent encounter for fracture with routine healing: Secondary | ICD-10-CM | POA: Diagnosis not present

## 2019-11-19 DIAGNOSIS — Z7902 Long term (current) use of antithrombotics/antiplatelets: Secondary | ICD-10-CM | POA: Diagnosis not present

## 2019-11-19 DIAGNOSIS — E1142 Type 2 diabetes mellitus with diabetic polyneuropathy: Secondary | ICD-10-CM | POA: Diagnosis not present

## 2019-11-19 DIAGNOSIS — G43909 Migraine, unspecified, not intractable, without status migrainosus: Secondary | ICD-10-CM | POA: Diagnosis not present

## 2019-11-19 DIAGNOSIS — Z7984 Long term (current) use of oral hypoglycemic drugs: Secondary | ICD-10-CM | POA: Diagnosis not present

## 2019-11-19 DIAGNOSIS — K219 Gastro-esophageal reflux disease without esophagitis: Secondary | ICD-10-CM | POA: Diagnosis not present

## 2019-11-19 DIAGNOSIS — M1990 Unspecified osteoarthritis, unspecified site: Secondary | ICD-10-CM | POA: Diagnosis not present

## 2019-11-19 DIAGNOSIS — I119 Hypertensive heart disease without heart failure: Secondary | ICD-10-CM | POA: Diagnosis not present

## 2019-11-21 ENCOUNTER — Telehealth: Payer: Self-pay | Admitting: Family Medicine

## 2019-11-21 DIAGNOSIS — I2510 Atherosclerotic heart disease of native coronary artery without angina pectoris: Secondary | ICD-10-CM | POA: Diagnosis not present

## 2019-11-21 DIAGNOSIS — Z7984 Long term (current) use of oral hypoglycemic drugs: Secondary | ICD-10-CM | POA: Diagnosis not present

## 2019-11-21 DIAGNOSIS — G43909 Migraine, unspecified, not intractable, without status migrainosus: Secondary | ICD-10-CM | POA: Diagnosis not present

## 2019-11-21 DIAGNOSIS — S52612D Displaced fracture of left ulna styloid process, subsequent encounter for closed fracture with routine healing: Secondary | ICD-10-CM | POA: Diagnosis not present

## 2019-11-21 DIAGNOSIS — Z7902 Long term (current) use of antithrombotics/antiplatelets: Secondary | ICD-10-CM | POA: Diagnosis not present

## 2019-11-21 DIAGNOSIS — E785 Hyperlipidemia, unspecified: Secondary | ICD-10-CM | POA: Diagnosis not present

## 2019-11-21 DIAGNOSIS — Z9181 History of falling: Secondary | ICD-10-CM | POA: Diagnosis not present

## 2019-11-21 DIAGNOSIS — E1151 Type 2 diabetes mellitus with diabetic peripheral angiopathy without gangrene: Secondary | ICD-10-CM | POA: Diagnosis not present

## 2019-11-21 DIAGNOSIS — S92421D Displaced fracture of distal phalanx of right great toe, subsequent encounter for fracture with routine healing: Secondary | ICD-10-CM | POA: Diagnosis not present

## 2019-11-21 DIAGNOSIS — K219 Gastro-esophageal reflux disease without esophagitis: Secondary | ICD-10-CM | POA: Diagnosis not present

## 2019-11-21 DIAGNOSIS — Z8673 Personal history of transient ischemic attack (TIA), and cerebral infarction without residual deficits: Secondary | ICD-10-CM | POA: Diagnosis not present

## 2019-11-21 DIAGNOSIS — S2242XD Multiple fractures of ribs, left side, subsequent encounter for fracture with routine healing: Secondary | ICD-10-CM | POA: Diagnosis not present

## 2019-11-21 DIAGNOSIS — I119 Hypertensive heart disease without heart failure: Secondary | ICD-10-CM | POA: Diagnosis not present

## 2019-11-21 DIAGNOSIS — E1142 Type 2 diabetes mellitus with diabetic polyneuropathy: Secondary | ICD-10-CM | POA: Diagnosis not present

## 2019-11-21 DIAGNOSIS — M1990 Unspecified osteoarthritis, unspecified site: Secondary | ICD-10-CM | POA: Diagnosis not present

## 2019-11-21 NOTE — Telephone Encounter (Signed)
[facility] will not be able to start services until 11/26/19. Let me know if this is not ok,Thanks

## 2019-11-24 NOTE — Telephone Encounter (Signed)
ok 

## 2019-11-25 ENCOUNTER — Telehealth: Payer: Self-pay

## 2019-11-25 DIAGNOSIS — Z7902 Long term (current) use of antithrombotics/antiplatelets: Secondary | ICD-10-CM | POA: Diagnosis not present

## 2019-11-25 DIAGNOSIS — E1151 Type 2 diabetes mellitus with diabetic peripheral angiopathy without gangrene: Secondary | ICD-10-CM | POA: Diagnosis not present

## 2019-11-25 DIAGNOSIS — I119 Hypertensive heart disease without heart failure: Secondary | ICD-10-CM | POA: Diagnosis not present

## 2019-11-25 DIAGNOSIS — Z8673 Personal history of transient ischemic attack (TIA), and cerebral infarction without residual deficits: Secondary | ICD-10-CM | POA: Diagnosis not present

## 2019-11-25 DIAGNOSIS — K219 Gastro-esophageal reflux disease without esophagitis: Secondary | ICD-10-CM | POA: Diagnosis not present

## 2019-11-25 DIAGNOSIS — Z7984 Long term (current) use of oral hypoglycemic drugs: Secondary | ICD-10-CM | POA: Diagnosis not present

## 2019-11-25 DIAGNOSIS — I2510 Atherosclerotic heart disease of native coronary artery without angina pectoris: Secondary | ICD-10-CM | POA: Diagnosis not present

## 2019-11-25 DIAGNOSIS — E785 Hyperlipidemia, unspecified: Secondary | ICD-10-CM | POA: Diagnosis not present

## 2019-11-25 DIAGNOSIS — S92421D Displaced fracture of distal phalanx of right great toe, subsequent encounter for fracture with routine healing: Secondary | ICD-10-CM | POA: Diagnosis not present

## 2019-11-25 DIAGNOSIS — S52612D Displaced fracture of left ulna styloid process, subsequent encounter for closed fracture with routine healing: Secondary | ICD-10-CM | POA: Diagnosis not present

## 2019-11-25 DIAGNOSIS — G43909 Migraine, unspecified, not intractable, without status migrainosus: Secondary | ICD-10-CM | POA: Diagnosis not present

## 2019-11-25 DIAGNOSIS — M1990 Unspecified osteoarthritis, unspecified site: Secondary | ICD-10-CM | POA: Diagnosis not present

## 2019-11-25 DIAGNOSIS — Z9181 History of falling: Secondary | ICD-10-CM | POA: Diagnosis not present

## 2019-11-25 DIAGNOSIS — E1142 Type 2 diabetes mellitus with diabetic polyneuropathy: Secondary | ICD-10-CM | POA: Diagnosis not present

## 2019-11-25 DIAGNOSIS — S2242XD Multiple fractures of ribs, left side, subsequent encounter for fracture with routine healing: Secondary | ICD-10-CM | POA: Diagnosis not present

## 2019-11-25 NOTE — Telephone Encounter (Signed)
Copied from [place] [phone]. Topic: General - Other >> Nov 25, 2019  9:32 AM Oneta Rack wrote: Dawayne Patricia [facility] home [facility]  phone # [phone] states today from home visit patient systolic is 525 .

## 2019-11-26 ENCOUNTER — Telehealth: Payer: Medicare Other | Admitting: Cardiovascular Disease

## 2019-11-26 ENCOUNTER — Other Ambulatory Visit: Payer: Self-pay

## 2019-11-26 ENCOUNTER — Encounter: Payer: Self-pay | Admitting: Cardiovascular Disease

## 2019-11-27 DIAGNOSIS — S92421D Displaced fracture of distal phalanx of right great toe, subsequent encounter for fracture with routine healing: Secondary | ICD-10-CM | POA: Diagnosis not present

## 2019-11-27 DIAGNOSIS — I2510 Atherosclerotic heart disease of native coronary artery without angina pectoris: Secondary | ICD-10-CM | POA: Diagnosis not present

## 2019-11-27 DIAGNOSIS — E1151 Type 2 diabetes mellitus with diabetic peripheral angiopathy without gangrene: Secondary | ICD-10-CM | POA: Diagnosis not present

## 2019-11-27 DIAGNOSIS — S52612D Displaced fracture of left ulna styloid process, subsequent encounter for closed fracture with routine healing: Secondary | ICD-10-CM | POA: Diagnosis not present

## 2019-11-27 DIAGNOSIS — Z8673 Personal history of transient ischemic attack (TIA), and cerebral infarction without residual deficits: Secondary | ICD-10-CM | POA: Diagnosis not present

## 2019-11-27 DIAGNOSIS — I119 Hypertensive heart disease without heart failure: Secondary | ICD-10-CM | POA: Diagnosis not present

## 2019-11-27 DIAGNOSIS — E785 Hyperlipidemia, unspecified: Secondary | ICD-10-CM | POA: Diagnosis not present

## 2019-11-27 DIAGNOSIS — S2242XD Multiple fractures of ribs, left side, subsequent encounter for fracture with routine healing: Secondary | ICD-10-CM | POA: Diagnosis not present

## 2019-11-27 DIAGNOSIS — M1990 Unspecified osteoarthritis, unspecified site: Secondary | ICD-10-CM | POA: Diagnosis not present

## 2019-11-27 DIAGNOSIS — E1142 Type 2 diabetes mellitus with diabetic polyneuropathy: Secondary | ICD-10-CM | POA: Diagnosis not present

## 2019-11-27 DIAGNOSIS — Z7902 Long term (current) use of antithrombotics/antiplatelets: Secondary | ICD-10-CM | POA: Diagnosis not present

## 2019-11-27 DIAGNOSIS — K219 Gastro-esophageal reflux disease without esophagitis: Secondary | ICD-10-CM | POA: Diagnosis not present

## 2019-11-27 DIAGNOSIS — Z9181 History of falling: Secondary | ICD-10-CM | POA: Diagnosis not present

## 2019-11-27 DIAGNOSIS — G43909 Migraine, unspecified, not intractable, without status migrainosus: Secondary | ICD-10-CM | POA: Diagnosis not present

## 2019-11-27 DIAGNOSIS — Z7984 Long term (current) use of oral hypoglycemic drugs: Secondary | ICD-10-CM | POA: Diagnosis not present

## 2019-11-30 IMAGING — CR DG SACRUM/COCCYX 2+V
1 series · 4 of 4 positions shown · non-contrast
Comparison: Lumbar spine imaging 10/03/2017

CLINICAL DATA: Tailbone pain for 2 weeks.  No known injury.

EXAM:
SACRUM AND COCCYX - 2+ VIEW

[Series 1: dg sacrum/coccyx · 0.14mm/px · 4 of 4 slices shown]
[im 1/4]
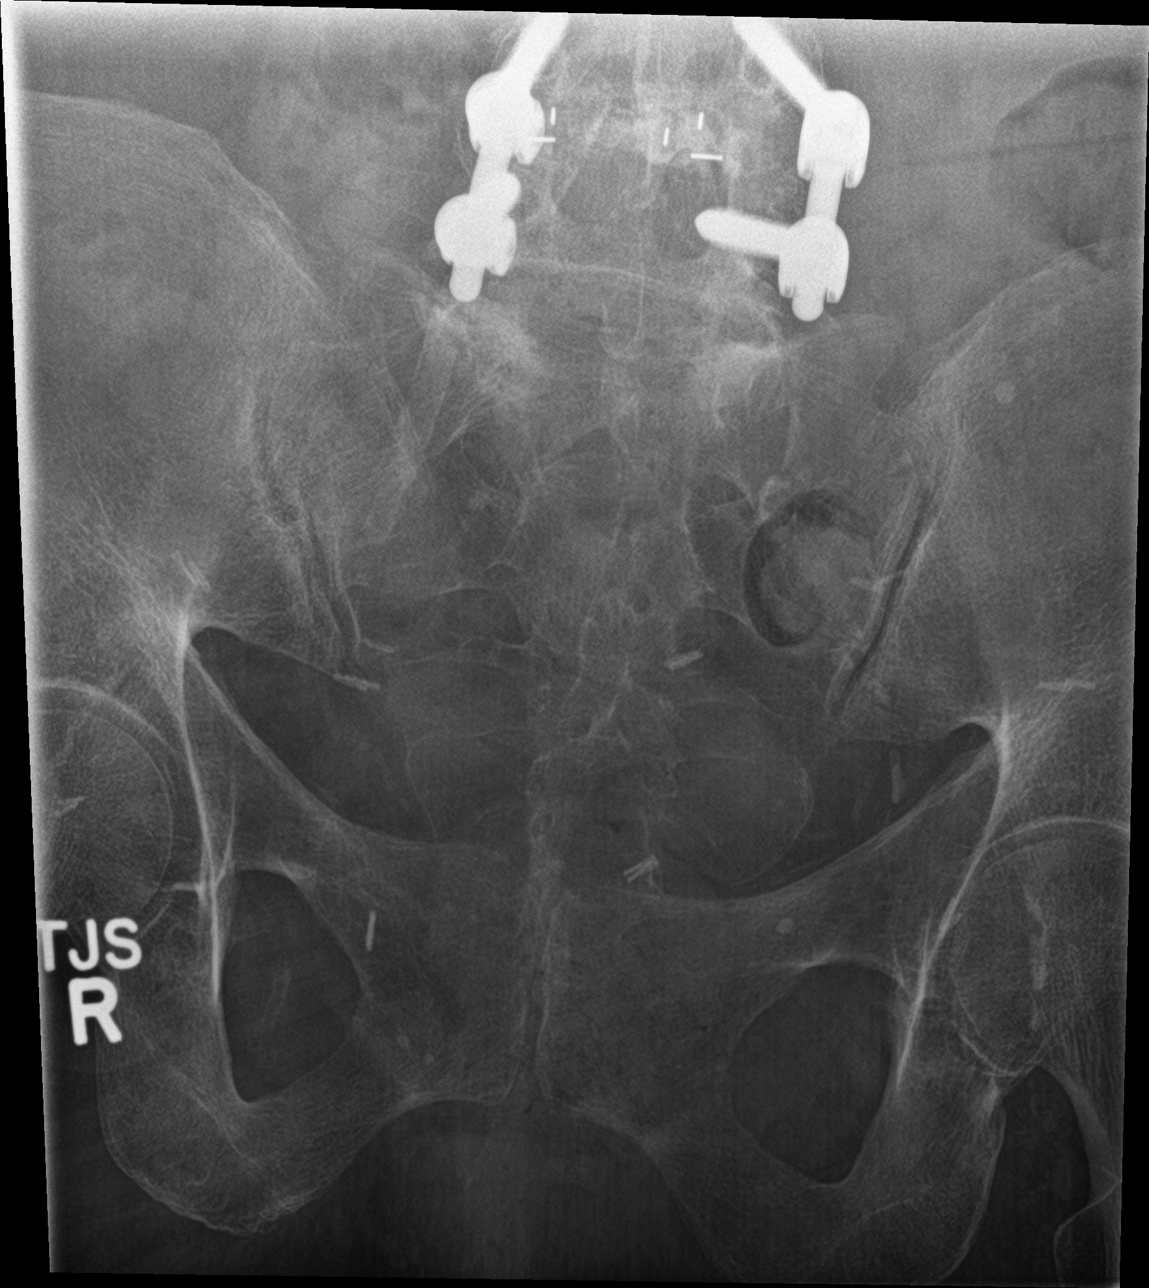
[im 2/4]
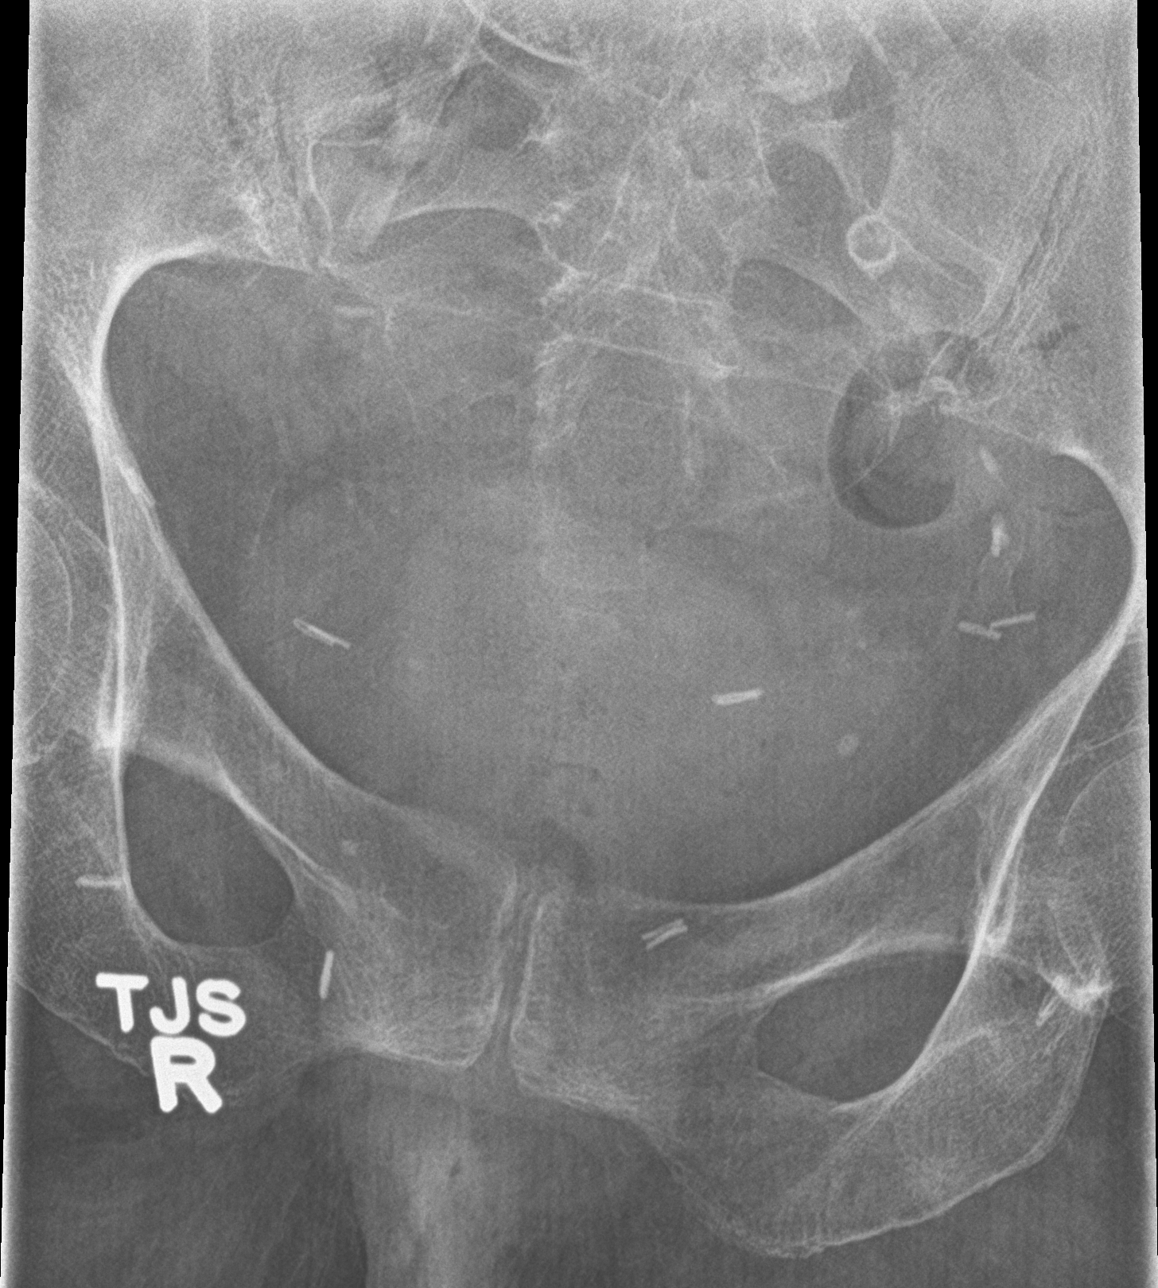
[im 3/4]
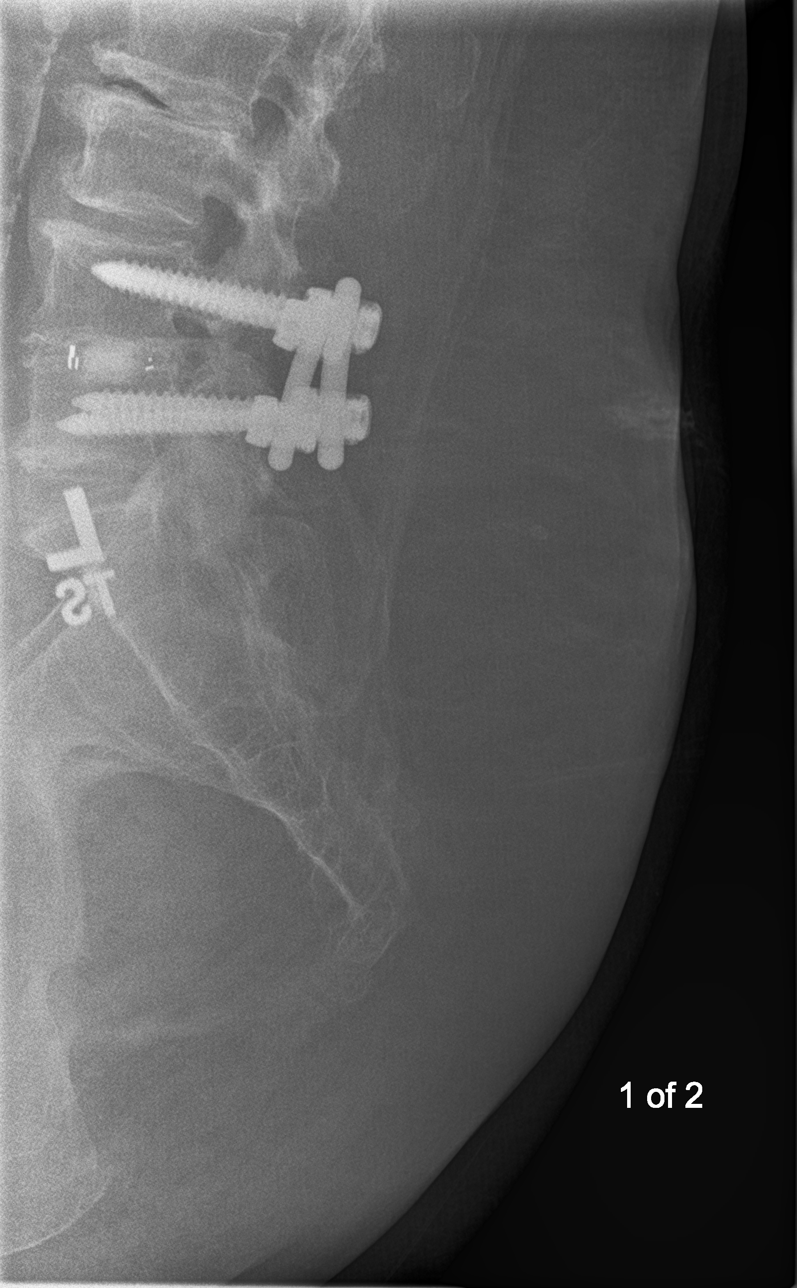
[im 4/4]
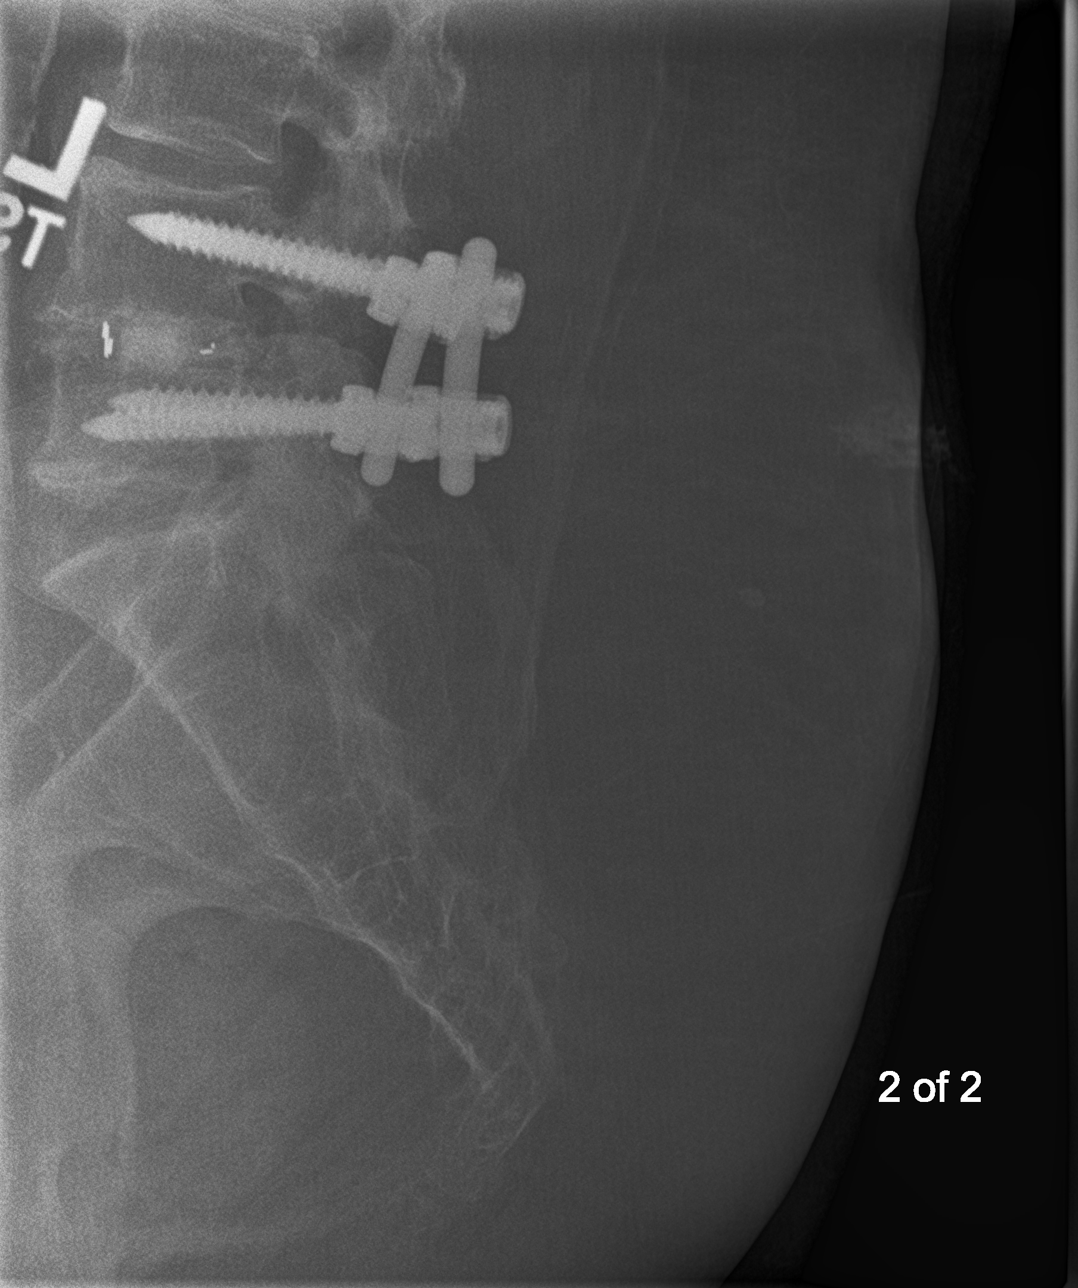

[4 of 4 positions shown; findings below may reference images not displayed]

FINDINGS: Diffuse osteopenia. No fracture. Postoperative changes in the lower
lumbar spine from posterior fusion at L4-5..
IMPRESSION: No acute bony abnormality.

## 2019-12-02 ENCOUNTER — Telehealth: Payer: Self-pay | Admitting: Family Medicine

## 2019-12-02 DIAGNOSIS — Z8673 Personal history of transient ischemic attack (TIA), and cerebral infarction without residual deficits: Secondary | ICD-10-CM | POA: Diagnosis not present

## 2019-12-02 DIAGNOSIS — E1151 Type 2 diabetes mellitus with diabetic peripheral angiopathy without gangrene: Secondary | ICD-10-CM | POA: Diagnosis not present

## 2019-12-02 DIAGNOSIS — E785 Hyperlipidemia, unspecified: Secondary | ICD-10-CM | POA: Diagnosis not present

## 2019-12-02 DIAGNOSIS — S52612D Displaced fracture of left ulna styloid process, subsequent encounter for closed fracture with routine healing: Secondary | ICD-10-CM | POA: Diagnosis not present

## 2019-12-02 DIAGNOSIS — I119 Hypertensive heart disease without heart failure: Secondary | ICD-10-CM | POA: Diagnosis not present

## 2019-12-02 DIAGNOSIS — S2242XD Multiple fractures of ribs, left side, subsequent encounter for fracture with routine healing: Secondary | ICD-10-CM | POA: Diagnosis not present

## 2019-12-02 DIAGNOSIS — M1990 Unspecified osteoarthritis, unspecified site: Secondary | ICD-10-CM | POA: Diagnosis not present

## 2019-12-02 DIAGNOSIS — K219 Gastro-esophageal reflux disease without esophagitis: Secondary | ICD-10-CM | POA: Diagnosis not present

## 2019-12-02 DIAGNOSIS — Z7902 Long term (current) use of antithrombotics/antiplatelets: Secondary | ICD-10-CM | POA: Diagnosis not present

## 2019-12-02 DIAGNOSIS — Z9181 History of falling: Secondary | ICD-10-CM | POA: Diagnosis not present

## 2019-12-02 DIAGNOSIS — G43909 Migraine, unspecified, not intractable, without status migrainosus: Secondary | ICD-10-CM | POA: Diagnosis not present

## 2019-12-02 DIAGNOSIS — I2510 Atherosclerotic heart disease of native coronary artery without angina pectoris: Secondary | ICD-10-CM | POA: Diagnosis not present

## 2019-12-02 DIAGNOSIS — Z7984 Long term (current) use of oral hypoglycemic drugs: Secondary | ICD-10-CM | POA: Diagnosis not present

## 2019-12-02 DIAGNOSIS — S92421D Displaced fracture of distal phalanx of right great toe, subsequent encounter for fracture with routine healing: Secondary | ICD-10-CM | POA: Diagnosis not present

## 2019-12-02 DIAGNOSIS — E1142 Type 2 diabetes mellitus with diabetic polyneuropathy: Secondary | ICD-10-CM | POA: Diagnosis not present

## 2019-12-02 NOTE — Telephone Encounter (Signed)
HYDROcodone-acetaminophen (NORCO) 5-325 MG tablet Medication Date: 11/13/2019 Department: [place] (1A) Ordering/Authorizing: Louellen Molder, MD   Pt was given at a recent [facility] visit, has a script of 100 that was written by Dr Rosanna Randy last yearin history. Wants a refill,  Wants the 100  Script,tried to explain to her that it was not current med and offered visit, pt states that it was just prescribed, (not Dr. Rosanna Randy) demanding refill. FU with pt as she wants to talk to Dr Rosanna Randy.

## 2019-12-02 NOTE — Telephone Encounter (Signed)
Please advise? Patient is requesting rx for Norco 5-325 mg.

## 2019-12-03 ENCOUNTER — Encounter (INDEPENDENT_AMBULATORY_CARE_PROVIDER_SITE_OTHER): Payer: Medicare Other

## 2019-12-03 ENCOUNTER — Ambulatory Visit (INDEPENDENT_AMBULATORY_CARE_PROVIDER_SITE_OTHER): Payer: Medicare Other | Admitting: Nurse Practitioner

## 2019-12-04 DIAGNOSIS — Z7902 Long term (current) use of antithrombotics/antiplatelets: Secondary | ICD-10-CM | POA: Diagnosis not present

## 2019-12-04 DIAGNOSIS — M1990 Unspecified osteoarthritis, unspecified site: Secondary | ICD-10-CM | POA: Diagnosis not present

## 2019-12-04 DIAGNOSIS — S52612D Displaced fracture of left ulna styloid process, subsequent encounter for closed fracture with routine healing: Secondary | ICD-10-CM | POA: Diagnosis not present

## 2019-12-04 DIAGNOSIS — E1151 Type 2 diabetes mellitus with diabetic peripheral angiopathy without gangrene: Secondary | ICD-10-CM | POA: Diagnosis not present

## 2019-12-04 DIAGNOSIS — S2242XD Multiple fractures of ribs, left side, subsequent encounter for fracture with routine healing: Secondary | ICD-10-CM | POA: Diagnosis not present

## 2019-12-04 DIAGNOSIS — I2510 Atherosclerotic heart disease of native coronary artery without angina pectoris: Secondary | ICD-10-CM | POA: Diagnosis not present

## 2019-12-04 DIAGNOSIS — G43909 Migraine, unspecified, not intractable, without status migrainosus: Secondary | ICD-10-CM | POA: Diagnosis not present

## 2019-12-04 DIAGNOSIS — I119 Hypertensive heart disease without heart failure: Secondary | ICD-10-CM | POA: Diagnosis not present

## 2019-12-04 DIAGNOSIS — E785 Hyperlipidemia, unspecified: Secondary | ICD-10-CM | POA: Diagnosis not present

## 2019-12-04 DIAGNOSIS — Z9181 History of falling: Secondary | ICD-10-CM | POA: Diagnosis not present

## 2019-12-04 DIAGNOSIS — Z7984 Long term (current) use of oral hypoglycemic drugs: Secondary | ICD-10-CM | POA: Diagnosis not present

## 2019-12-04 DIAGNOSIS — E1142 Type 2 diabetes mellitus with diabetic polyneuropathy: Secondary | ICD-10-CM | POA: Diagnosis not present

## 2019-12-04 DIAGNOSIS — S92421D Displaced fracture of distal phalanx of right great toe, subsequent encounter for fracture with routine healing: Secondary | ICD-10-CM | POA: Diagnosis not present

## 2019-12-04 DIAGNOSIS — K219 Gastro-esophageal reflux disease without esophagitis: Secondary | ICD-10-CM | POA: Diagnosis not present

## 2019-12-04 DIAGNOSIS — Z8673 Personal history of transient ischemic attack (TIA), and cerebral infarction without residual deficits: Secondary | ICD-10-CM | POA: Diagnosis not present

## 2019-12-09 DIAGNOSIS — S6292XD Unspecified fracture of left wrist and hand, subsequent encounter for fracture with routine healing: Secondary | ICD-10-CM | POA: Diagnosis not present

## 2019-12-09 DIAGNOSIS — E1140 Type 2 diabetes mellitus with diabetic neuropathy, unspecified: Secondary | ICD-10-CM | POA: Diagnosis not present

## 2019-12-09 DIAGNOSIS — S92404D Nondisplaced unspecified fracture of right great toe, subsequent encounter for fracture with routine healing: Secondary | ICD-10-CM | POA: Diagnosis not present

## 2019-12-09 DIAGNOSIS — B351 Tinea unguium: Secondary | ICD-10-CM | POA: Diagnosis not present

## 2019-12-10 ENCOUNTER — Other Ambulatory Visit: Payer: Self-pay | Admitting: Family Medicine

## 2019-12-10 DIAGNOSIS — E785 Hyperlipidemia, unspecified: Secondary | ICD-10-CM | POA: Diagnosis not present

## 2019-12-10 DIAGNOSIS — S92421D Displaced fracture of distal phalanx of right great toe, subsequent encounter for fracture with routine healing: Secondary | ICD-10-CM | POA: Diagnosis not present

## 2019-12-10 DIAGNOSIS — I119 Hypertensive heart disease without heart failure: Secondary | ICD-10-CM | POA: Diagnosis not present

## 2019-12-10 DIAGNOSIS — I2510 Atherosclerotic heart disease of native coronary artery without angina pectoris: Secondary | ICD-10-CM | POA: Diagnosis not present

## 2019-12-10 DIAGNOSIS — G43909 Migraine, unspecified, not intractable, without status migrainosus: Secondary | ICD-10-CM | POA: Diagnosis not present

## 2019-12-10 DIAGNOSIS — S52612D Displaced fracture of left ulna styloid process, subsequent encounter for closed fracture with routine healing: Secondary | ICD-10-CM | POA: Diagnosis not present

## 2019-12-10 DIAGNOSIS — S2242XD Multiple fractures of ribs, left side, subsequent encounter for fracture with routine healing: Secondary | ICD-10-CM | POA: Diagnosis not present

## 2019-12-10 DIAGNOSIS — M1990 Unspecified osteoarthritis, unspecified site: Secondary | ICD-10-CM | POA: Diagnosis not present

## 2019-12-10 DIAGNOSIS — E1142 Type 2 diabetes mellitus with diabetic polyneuropathy: Secondary | ICD-10-CM | POA: Diagnosis not present

## 2019-12-10 DIAGNOSIS — Z9181 History of falling: Secondary | ICD-10-CM | POA: Diagnosis not present

## 2019-12-10 DIAGNOSIS — Z7984 Long term (current) use of oral hypoglycemic drugs: Secondary | ICD-10-CM | POA: Diagnosis not present

## 2019-12-10 DIAGNOSIS — E1151 Type 2 diabetes mellitus with diabetic peripheral angiopathy without gangrene: Secondary | ICD-10-CM | POA: Diagnosis not present

## 2019-12-10 DIAGNOSIS — K219 Gastro-esophageal reflux disease without esophagitis: Secondary | ICD-10-CM | POA: Diagnosis not present

## 2019-12-10 DIAGNOSIS — Z7902 Long term (current) use of antithrombotics/antiplatelets: Secondary | ICD-10-CM | POA: Diagnosis not present

## 2019-12-10 DIAGNOSIS — Z8673 Personal history of transient ischemic attack (TIA), and cerebral infarction without residual deficits: Secondary | ICD-10-CM | POA: Diagnosis not present

## 2019-12-10 MED ORDER — HYDROCODONE-ACETAMINOPHEN 5-325 MG PO TABS: 2.0000 | ORAL_TABLET | Freq: Four times a day (QID) | ORAL | 0 refills | Status: DC | PRN

## 2019-12-10 NOTE — Telephone Encounter (Signed)
Medication Refill - Medication: HYDROcodone-acetaminophen ([place]) 5-325 MG tablet    Preferred Pharmacy (with phone number or street name):  [id number] [address], [place] Phone:  [phone]  Fax:  [phone]       Agent: Please be advised that RX refills may take up to 3 business days. We ask that you follow-up with your pharmacy.

## 2019-12-11 DIAGNOSIS — S2242XD Multiple fractures of ribs, left side, subsequent encounter for fracture with routine healing: Secondary | ICD-10-CM | POA: Diagnosis not present

## 2019-12-11 DIAGNOSIS — E1142 Type 2 diabetes mellitus with diabetic polyneuropathy: Secondary | ICD-10-CM | POA: Diagnosis not present

## 2019-12-11 DIAGNOSIS — Z7902 Long term (current) use of antithrombotics/antiplatelets: Secondary | ICD-10-CM | POA: Diagnosis not present

## 2019-12-11 DIAGNOSIS — S52612D Displaced fracture of left ulna styloid process, subsequent encounter for closed fracture with routine healing: Secondary | ICD-10-CM | POA: Diagnosis not present

## 2019-12-11 DIAGNOSIS — E1151 Type 2 diabetes mellitus with diabetic peripheral angiopathy without gangrene: Secondary | ICD-10-CM | POA: Diagnosis not present

## 2019-12-11 DIAGNOSIS — Z7984 Long term (current) use of oral hypoglycemic drugs: Secondary | ICD-10-CM | POA: Diagnosis not present

## 2019-12-11 DIAGNOSIS — K219 Gastro-esophageal reflux disease without esophagitis: Secondary | ICD-10-CM | POA: Diagnosis not present

## 2019-12-11 DIAGNOSIS — Z8673 Personal history of transient ischemic attack (TIA), and cerebral infarction without residual deficits: Secondary | ICD-10-CM | POA: Diagnosis not present

## 2019-12-11 DIAGNOSIS — S92421D Displaced fracture of distal phalanx of right great toe, subsequent encounter for fracture with routine healing: Secondary | ICD-10-CM | POA: Diagnosis not present

## 2019-12-11 DIAGNOSIS — G43909 Migraine, unspecified, not intractable, without status migrainosus: Secondary | ICD-10-CM | POA: Diagnosis not present

## 2019-12-11 DIAGNOSIS — Z9181 History of falling: Secondary | ICD-10-CM | POA: Diagnosis not present

## 2019-12-11 DIAGNOSIS — M1990 Unspecified osteoarthritis, unspecified site: Secondary | ICD-10-CM | POA: Diagnosis not present

## 2019-12-11 DIAGNOSIS — I119 Hypertensive heart disease without heart failure: Secondary | ICD-10-CM | POA: Diagnosis not present

## 2019-12-11 DIAGNOSIS — E785 Hyperlipidemia, unspecified: Secondary | ICD-10-CM | POA: Diagnosis not present

## 2019-12-11 DIAGNOSIS — I2510 Atherosclerotic heart disease of native coronary artery without angina pectoris: Secondary | ICD-10-CM | POA: Diagnosis not present

## 2019-12-12 NOTE — Addendum Note (Signed)
Addended by: Julieta Bellini on: 12/12/2019 09:45 AM   Modules accepted: Orders

## 2019-12-12 NOTE — Telephone Encounter (Signed)
Patient states that pharmacy never received RX. She inquired if it can be resent today.

## 2019-12-12 NOTE — Telephone Encounter (Signed)
Please resend rx, the last one printed.

## 2019-12-18 ENCOUNTER — Other Ambulatory Visit: Payer: Self-pay | Admitting: Family Medicine

## 2019-12-18 DIAGNOSIS — I10 Essential (primary) hypertension: Secondary | ICD-10-CM

## 2019-12-22 ENCOUNTER — Telehealth: Payer: Self-pay

## 2019-12-22 NOTE — Telephone Encounter (Signed)
Copied from [place] [phone]. Topic: General - Inquiry >> Dec 22, 2019  2:40 PM Richardo Priest, NT wrote: Reason for CRM: [place], called from pharmacy, that they will be faxing a request for diabetic supplies for this patient. Please advise.

## 2019-12-23 ENCOUNTER — Emergency Department: Payer: Medicare Other

## 2019-12-23 ENCOUNTER — Observation Stay
Admission: EM | Admit: 2019-12-23 | Discharge: 2019-12-25 | Disposition: A | Discharge status: Home / Self Care | Payer: Medicare Other | Attending: Internal Medicine | Admitting: Internal Medicine

## 2019-12-23 ENCOUNTER — Ambulatory Visit: Payer: Self-pay | Admitting: *Deleted

## 2019-12-23 ENCOUNTER — Other Ambulatory Visit: Payer: Self-pay

## 2019-12-23 ENCOUNTER — Ambulatory Visit: Payer: Medicare Other | Admitting: Physician Assistant

## 2019-12-23 DIAGNOSIS — Z8614 Personal history of Methicillin resistant Staphylococcus aureus infection: Secondary | ICD-10-CM | POA: Insufficient documentation

## 2019-12-23 DIAGNOSIS — M1990 Unspecified osteoarthritis, unspecified site: Secondary | ICD-10-CM | POA: Insufficient documentation

## 2019-12-23 DIAGNOSIS — E1159 Type 2 diabetes mellitus with other circulatory complications: Secondary | ICD-10-CM | POA: Diagnosis present

## 2019-12-23 DIAGNOSIS — N183 Chronic kidney disease, stage 3 unspecified: Secondary | ICD-10-CM | POA: Diagnosis present

## 2019-12-23 DIAGNOSIS — Z23 Encounter for immunization: Secondary | ICD-10-CM | POA: Insufficient documentation

## 2019-12-23 DIAGNOSIS — Z7902 Long term (current) use of antithrombotics/antiplatelets: Secondary | ICD-10-CM | POA: Diagnosis not present

## 2019-12-23 DIAGNOSIS — G43909 Migraine, unspecified, not intractable, without status migrainosus: Secondary | ICD-10-CM | POA: Diagnosis not present

## 2019-12-23 DIAGNOSIS — R9431 Abnormal electrocardiogram [ECG] [EKG]: Secondary | ICD-10-CM | POA: Diagnosis not present

## 2019-12-23 DIAGNOSIS — E1122 Type 2 diabetes mellitus with diabetic chronic kidney disease: Secondary | ICD-10-CM | POA: Insufficient documentation

## 2019-12-23 DIAGNOSIS — Z66 Do not resuscitate: Secondary | ICD-10-CM | POA: Insufficient documentation

## 2019-12-23 DIAGNOSIS — E039 Hypothyroidism, unspecified: Secondary | ICD-10-CM | POA: Diagnosis not present

## 2019-12-23 DIAGNOSIS — E785 Hyperlipidemia, unspecified: Secondary | ICD-10-CM | POA: Diagnosis not present

## 2019-12-23 DIAGNOSIS — I959 Hypotension, unspecified: Secondary | ICD-10-CM | POA: Diagnosis not present

## 2019-12-23 DIAGNOSIS — Z7984 Long term (current) use of oral hypoglycemic drugs: Secondary | ICD-10-CM | POA: Diagnosis not present

## 2019-12-23 DIAGNOSIS — R569 Unspecified convulsions: Secondary | ICD-10-CM | POA: Diagnosis not present

## 2019-12-23 DIAGNOSIS — R4701 Aphasia: Secondary | ICD-10-CM | POA: Insufficient documentation

## 2019-12-23 DIAGNOSIS — Z6833 Body mass index (BMI) 33.0-33.9, adult: Secondary | ICD-10-CM | POA: Insufficient documentation

## 2019-12-23 DIAGNOSIS — S2242XD Multiple fractures of ribs, left side, subsequent encounter for fracture with routine healing: Secondary | ICD-10-CM | POA: Diagnosis not present

## 2019-12-23 DIAGNOSIS — Z79899 Other long term (current) drug therapy: Secondary | ICD-10-CM | POA: Insufficient documentation

## 2019-12-23 DIAGNOSIS — Z87892 Personal history of anaphylaxis: Secondary | ICD-10-CM | POA: Insufficient documentation

## 2019-12-23 DIAGNOSIS — E11319 Type 2 diabetes mellitus with unspecified diabetic retinopathy without macular edema: Secondary | ICD-10-CM | POA: Insufficient documentation

## 2019-12-23 DIAGNOSIS — I129 Hypertensive chronic kidney disease with stage 1 through stage 4 chronic kidney disease, or unspecified chronic kidney disease: Secondary | ICD-10-CM | POA: Diagnosis not present

## 2019-12-23 DIAGNOSIS — S92421D Displaced fracture of distal phalanx of right great toe, subsequent encounter for fracture with routine healing: Secondary | ICD-10-CM | POA: Diagnosis not present

## 2019-12-23 DIAGNOSIS — Z20822 Contact with and (suspected) exposure to covid-19: Secondary | ICD-10-CM | POA: Diagnosis not present

## 2019-12-23 DIAGNOSIS — E1149 Type 2 diabetes mellitus with other diabetic neurological complication: Secondary | ICD-10-CM | POA: Insufficient documentation

## 2019-12-23 DIAGNOSIS — K589 Irritable bowel syndrome without diarrhea: Secondary | ICD-10-CM | POA: Diagnosis not present

## 2019-12-23 DIAGNOSIS — S52612D Displaced fracture of left ulna styloid process, subsequent encounter for closed fracture with routine healing: Secondary | ICD-10-CM | POA: Diagnosis not present

## 2019-12-23 DIAGNOSIS — Z8249 Family history of ischemic heart disease and other diseases of the circulatory system: Secondary | ICD-10-CM | POA: Insufficient documentation

## 2019-12-23 DIAGNOSIS — Z9181 History of falling: Secondary | ICD-10-CM | POA: Diagnosis not present

## 2019-12-23 DIAGNOSIS — R200 Anesthesia of skin: Secondary | ICD-10-CM | POA: Diagnosis not present

## 2019-12-23 DIAGNOSIS — Z8673 Personal history of transient ischemic attack (TIA), and cerebral infarction without residual deficits: Secondary | ICD-10-CM | POA: Diagnosis not present

## 2019-12-23 DIAGNOSIS — R079 Chest pain, unspecified: Secondary | ICD-10-CM | POA: Diagnosis not present

## 2019-12-23 DIAGNOSIS — I6381 Other cerebral infarction due to occlusion or stenosis of small artery: Secondary | ICD-10-CM | POA: Diagnosis not present

## 2019-12-23 DIAGNOSIS — I152 Hypertension secondary to endocrine disorders: Secondary | ICD-10-CM | POA: Diagnosis present

## 2019-12-23 DIAGNOSIS — N1831 Chronic kidney disease, stage 3a: Secondary | ICD-10-CM | POA: Diagnosis not present

## 2019-12-23 DIAGNOSIS — K219 Gastro-esophageal reflux disease without esophagitis: Secondary | ICD-10-CM | POA: Diagnosis not present

## 2019-12-23 DIAGNOSIS — R531 Weakness: Principal | ICD-10-CM | POA: Insufficient documentation

## 2019-12-23 DIAGNOSIS — E1169 Type 2 diabetes mellitus with other specified complication: Secondary | ICD-10-CM | POA: Diagnosis present

## 2019-12-23 DIAGNOSIS — I6782 Cerebral ischemia: Secondary | ICD-10-CM | POA: Insufficient documentation

## 2019-12-23 DIAGNOSIS — E1142 Type 2 diabetes mellitus with diabetic polyneuropathy: Secondary | ICD-10-CM | POA: Diagnosis not present

## 2019-12-23 DIAGNOSIS — E11649 Type 2 diabetes mellitus with hypoglycemia without coma: Secondary | ICD-10-CM | POA: Diagnosis not present

## 2019-12-23 DIAGNOSIS — I69398 Other sequelae of cerebral infarction: Secondary | ICD-10-CM | POA: Diagnosis not present

## 2019-12-23 DIAGNOSIS — Z794 Long term (current) use of insulin: Secondary | ICD-10-CM | POA: Insufficient documentation

## 2019-12-23 DIAGNOSIS — I119 Hypertensive heart disease without heart failure: Secondary | ICD-10-CM | POA: Diagnosis not present

## 2019-12-23 DIAGNOSIS — K2100 Gastro-esophageal reflux disease with esophagitis, without bleeding: Secondary | ICD-10-CM | POA: Insufficient documentation

## 2019-12-23 DIAGNOSIS — Z888 Allergy status to other drugs, medicaments and biological substances status: Secondary | ICD-10-CM | POA: Insufficient documentation

## 2019-12-23 DIAGNOSIS — E1151 Type 2 diabetes mellitus with diabetic peripheral angiopathy without gangrene: Secondary | ICD-10-CM | POA: Insufficient documentation

## 2019-12-23 DIAGNOSIS — I2510 Atherosclerotic heart disease of native coronary artery without angina pectoris: Secondary | ICD-10-CM | POA: Insufficient documentation

## 2019-12-23 DIAGNOSIS — Z833 Family history of diabetes mellitus: Secondary | ICD-10-CM | POA: Insufficient documentation

## 2019-12-23 DIAGNOSIS — Z8261 Family history of arthritis: Secondary | ICD-10-CM | POA: Insufficient documentation

## 2019-12-23 DIAGNOSIS — R0902 Hypoxemia: Secondary | ICD-10-CM | POA: Diagnosis not present

## 2019-12-23 DIAGNOSIS — I10 Essential (primary) hypertension: Secondary | ICD-10-CM | POA: Diagnosis not present

## 2019-12-23 DIAGNOSIS — Z7982 Long term (current) use of aspirin: Secondary | ICD-10-CM | POA: Insufficient documentation

## 2019-12-23 DIAGNOSIS — Z885 Allergy status to narcotic agent status: Secondary | ICD-10-CM | POA: Insufficient documentation

## 2019-12-23 LAB — COMPREHENSIVE METABOLIC PANEL
ALT: 15 U/L (ref 0–44)
AST: 26 U/L (ref 15–41)
Albumin: 3.3 g/dL — ABNORMAL LOW (ref 3.5–5.0)
Alkaline Phosphatase: 88 U/L (ref 38–126)
Anion gap: 10 (ref 5–15)
BUN: 26 mg/dL — ABNORMAL HIGH (ref 8–23)
CO2: 19 mmol/L — ABNORMAL LOW (ref 22–32)
Calcium: 8.5 mg/dL — ABNORMAL LOW (ref 8.9–10.3)
Chloride: 111 mmol/L (ref 98–111)
Creatinine, Ser: 1.54 mg/dL — ABNORMAL HIGH (ref 0.44–1.00)
GFR calc Af Amer: 41 mL/min — ABNORMAL LOW (ref >60)
GFR calc non Af Amer: 36 mL/min — ABNORMAL LOW (ref >60)
Glucose, Bld: 94 mg/dL (ref 70–99)
Potassium: 4.9 mmol/L (ref 3.5–5.1)
Sodium: 140 mmol/L (ref 135–145)
Total Bilirubin: 0.5 mg/dL (ref 0.3–1.2)
Total Protein: 6.9 g/dL (ref 6.5–8.1)

## 2019-12-23 LAB — CBC
HCT: 37.5 % (ref 36.0–46.0)
Hemoglobin: 12.3 g/dL (ref 12.0–15.0)
MCH: 28.4 pg (ref 26.0–34.0)
MCHC: 32.8 g/dL (ref 30.0–36.0)
MCV: 86.6 fL (ref 80.0–100.0)
Platelets: 292 10*3/uL (ref 150–400)
RBC: 4.33 MIL/uL (ref 3.87–5.11)
RDW: 13.9 % (ref 11.5–15.5)
WBC: 10.3 10*3/uL (ref 4.0–10.5)
nRBC: 0 % (ref 0.0–0.2)

## 2019-12-23 LAB — APTT: aPTT: 31 seconds (ref 24–36)

## 2019-12-23 LAB — DIFFERENTIAL
Abs Immature Granulocytes: 0.06 10*3/uL (ref 0.00–0.07)
Basophils Absolute: 0.1 10*3/uL (ref 0.0–0.1)
Basophils Relative: 1 %
Eosinophils Absolute: 0.2 10*3/uL (ref 0.0–0.5)
Eosinophils Relative: 2 %
Immature Granulocytes: 1 %
Lymphocytes Relative: 19 %
Lymphs Abs: 2 10*3/uL (ref 0.7–4.0)
Monocytes Absolute: 0.8 10*3/uL (ref 0.1–1.0)
Monocytes Relative: 8 %
Neutro Abs: 7.1 10*3/uL (ref 1.7–7.7)
Neutrophils Relative %: 69 %

## 2019-12-23 LAB — GLUCOSE, CAPILLARY
Glucose-Capillary: 49 mg/dL — ABNORMAL LOW (ref 70–99)
Glucose-Capillary: 54 mg/dL — ABNORMAL LOW (ref 70–99)
Glucose-Capillary: 198 mg/dL — ABNORMAL HIGH (ref 70–99)

## 2019-12-23 LAB — TROPONIN I (HIGH SENSITIVITY)
Troponin I (High Sensitivity): 17 ng/L (ref <18)
Troponin I (High Sensitivity): 16 ng/L (ref <18)

## 2019-12-23 LAB — SARS CORONAVIRUS 2 BY RT PCR (HOSPITAL ORDER, PERFORMED IN ~~LOC~~ HOSPITAL LAB): SARS Coronavirus 2: NEGATIVE

## 2019-12-23 LAB — PROTIME-INR
INR: 1 (ref 0.8–1.2)
Prothrombin Time: 12.4 seconds (ref 11.4–15.2)

## 2019-12-23 MED ORDER — HYDRALAZINE HCL 20 MG/ML IJ SOLN
5.0000 mg | INTRAMUSCULAR | Status: DC | PRN
  Administered 2019-12-25: 04:00:00 5 mg via INTRAVENOUS
  Filled 2019-12-23: qty 1

## 2019-12-23 MED ORDER — PANTOPRAZOLE SODIUM 40 MG PO TBEC
40.0000 mg | DELAYED_RELEASE_TABLET | Freq: Every day | ORAL | Status: DC
  Administered 2019-12-24 – 2019-12-25 (×2): 40 mg via ORAL
  Filled 2019-12-23: qty 1

## 2019-12-23 MED ORDER — STROKE: EARLY STAGES OF RECOVERY BOOK: Freq: Once | Status: AC

## 2019-12-23 MED ORDER — CLOPIDOGREL BISULFATE 75 MG PO TABS
75.0000 mg | ORAL_TABLET | Freq: Every day | ORAL | Status: DC
  Administered 2019-12-24 – 2019-12-25 (×2): 75 mg via ORAL
  Filled 2019-12-23 (×2): qty 1

## 2019-12-23 MED ORDER — ONDANSETRON HCL 4 MG/2ML IJ SOLN: 4.0000 mg | Freq: Three times a day (TID) | INTRAMUSCULAR | Status: DC | PRN

## 2019-12-23 MED ORDER — METOPROLOL SUCCINATE ER 50 MG PO TB24
100.0000 mg | ORAL_TABLET | Freq: Every day | ORAL | Status: DC
  Administered 2019-12-24 – 2019-12-25 (×2): 100 mg via ORAL
  Filled 2019-12-23 (×2): qty 2

## 2019-12-23 MED ORDER — DEXTROSE 50 % IV SOLN: 25.0000 mL | INTRAVENOUS | Status: DC | PRN

## 2019-12-23 MED ORDER — NITROGLYCERIN 0.4 MG SL SUBL: 0.4000 mg | SUBLINGUAL_TABLET | SUBLINGUAL | Status: DC | PRN

## 2019-12-23 MED ORDER — ENALAPRIL MALEATE 20 MG PO TABS
20.0000 mg | ORAL_TABLET | Freq: Two times a day (BID) | ORAL | Status: DC
  Administered 2019-12-23 – 2019-12-25 (×4): 20 mg via ORAL
  Filled 2019-12-23 (×3): qty 1
  Filled 2019-12-23: qty 2
  Filled 2019-12-23: qty 1

## 2019-12-23 MED ORDER — LEVETIRACETAM 500 MG PO TABS
500.0000 mg | ORAL_TABLET | Freq: Two times a day (BID) | ORAL | Status: DC
  Administered 2019-12-23 – 2019-12-25 (×4): 500 mg via ORAL
  Filled 2019-12-23 (×5): qty 1

## 2019-12-23 MED ORDER — LORAZEPAM 2 MG/ML IJ SOLN: 1.0000 mg | INTRAMUSCULAR | Status: DC | PRN

## 2019-12-23 MED ORDER — ENOXAPARIN SODIUM 40 MG/0.4ML ~~LOC~~ SOLN
40.0000 mg | Freq: Two times a day (BID) | SUBCUTANEOUS | Status: DC
  Administered 2019-12-23 – 2019-12-25 (×4): 40 mg via SUBCUTANEOUS
  Filled 2019-12-23 (×4): qty 0.4

## 2019-12-23 MED ORDER — ASPIRIN 81 MG PO CHEW
81.0000 mg | CHEWABLE_TABLET | Freq: Every day | ORAL | Status: DC
  Administered 2019-12-24 – 2019-12-25 (×2): 81 mg via ORAL
  Filled 2019-12-23 (×2): qty 1

## 2019-12-23 MED ORDER — SENNOSIDES-DOCUSATE SODIUM 8.6-50 MG PO TABS: 1.0000 | ORAL_TABLET | Freq: Every evening | ORAL | Status: DC | PRN

## 2019-12-23 MED ORDER — INSULIN DEGLUDEC 100 UNIT/ML ~~LOC~~ SOPN: 20.0000 [IU] | PEN_INJECTOR | Freq: Every day | SUBCUTANEOUS | Status: DC

## 2019-12-23 MED ORDER — DEXTROSE 50 % IV SOLN: 1.0000 | Freq: Once | INTRAVENOUS | Status: AC

## 2019-12-23 MED ORDER — ACETAMINOPHEN 325 MG PO TABS
650.0000 mg | ORAL_TABLET | Freq: Four times a day (QID) | ORAL | Status: DC | PRN
  Administered 2019-12-23 – 2019-12-24 (×2): 650 mg via ORAL
  Filled 2019-12-23 (×2): qty 2

## 2019-12-23 MED ORDER — AMLODIPINE BESYLATE 10 MG PO TABS
10.0000 mg | ORAL_TABLET | Freq: Every day | ORAL | Status: DC
  Administered 2019-12-24 – 2019-12-25 (×2): 10 mg via ORAL
  Filled 2019-12-23 (×2): qty 1

## 2019-12-23 MED ORDER — GABAPENTIN 100 MG PO CAPS
200.0000 mg | ORAL_CAPSULE | Freq: Two times a day (BID) | ORAL | Status: DC
  Administered 2019-12-23 – 2019-12-25 (×4): 200 mg via ORAL
  Filled 2019-12-23 (×5): qty 2

## 2019-12-23 MED ORDER — HYDRALAZINE HCL 50 MG PO TABS
100.0000 mg | ORAL_TABLET | Freq: Two times a day (BID) | ORAL | Status: DC
  Administered 2019-12-23 – 2019-12-25 (×4): 100 mg via ORAL
  Filled 2019-12-23 (×4): qty 2

## 2019-12-23 MED ORDER — DEXTROSE 50 % IV SOLN
INTRAVENOUS | Status: AC
  Administered 2019-12-23: 50 mL via INTRAVENOUS
  Filled 2019-12-23: qty 50

## 2019-12-23 MED ORDER — ATORVASTATIN CALCIUM 20 MG PO TABS
20.0000 mg | ORAL_TABLET | Freq: Every day | ORAL | Status: DC
  Administered 2019-12-23 – 2019-12-24 (×2): 20 mg via ORAL
  Filled 2019-12-23 (×2): qty 1

## 2019-12-23 MED ORDER — INSULIN ASPART 100 UNIT/ML ~~LOC~~ SOLN
0.0000 [IU] | SUBCUTANEOUS | Status: DC
  Administered 2019-12-24 (×4): 3 [IU] via SUBCUTANEOUS
  Administered 2019-12-24: 01:00:00 1 [IU] via SUBCUTANEOUS
  Administered 2019-12-24 – 2019-12-25 (×4): 2 [IU] via SUBCUTANEOUS
  Filled 2019-12-23 (×9): qty 1

## 2019-12-23 MED ORDER — HYDROCODONE-ACETAMINOPHEN 5-325 MG PO TABS
2.0000 | ORAL_TABLET | Freq: Four times a day (QID) | ORAL | Status: DC | PRN
  Administered 2019-12-24 – 2019-12-25 (×4): 2 via ORAL
  Filled 2019-12-23 (×4): qty 2

## 2019-12-23 NOTE — ED Triage Notes (Signed)
[facility] from home  woke up around 8ish with symptoms. States went to bed last night and felt completely, went to bed around 11pm.  felt numb to L leg with weakness to L leg able to move L leg though  20 G R hand VSS- 151/102, 150/58, CBG 102, NSR stroke 20 years ago, L arm is normally contracted up from previous stroke, states normally has aphasia as well  amblatory with assistance with EMS but weak L sided droop is normal per pt no pain

## 2019-12-23 NOTE — H&P (Signed)
History and Physical    Susan House TDD:[id number] DOB: 1956/05/01 DOA: 12/23/2019  Referring MD/NP/PA:   PCP: Jerrol Banana., MD   Patient coming from:  The patient is coming from home.  At baseline, pt is independent for most of ADL.        Chief Complaint: Worsening left-sided weakness  HPI: Susan House is a 64 y.o. female with medical history significant of hypertension, hyperlipidemia, diabetes mellitus, stroke with left-sided weakness, TIA, GERD, hypothyroidism, depression, migraine headache, Seizure, CAD, carotid artery stenosis, CKD stage III, who presents with worsening left-sided weakness.  Patient had a history of stroke with left-sided weakness.  Her left hand is contracted.  She normally can use walker to walk.  Patient states that since this morning she woke up with worsening left-sided weakness.  She cannot use a walker to walk anymore.  She also had mild difficulty speaking which has resolved.  No vision loss or hearing loss.  Patient states that she had some chest discomfort earlier, which is resolved.  Currently patient does not have chest pain, shortness breath, cough, fever or chills.  No nausea, vomiting, diarrhea, abdominal pain, symptoms of UTI.   ED Course: pt was found to have WBC 10.3, negative COVID-19 PCR, INR 1.0, PTT 31, troponin 17, renal function close to baseline, temperature normal, blood pressure 193/53, 162/55.  CT head is negative for acute intracranial abnormalities.  MRI is negative for new stroke.  CXR: Suspected nipple shadow right base; advise repeat study with nipple markers to confirm. No edema or airspace opacity. Heart size normal. Prior trauma lateral right clavicular region.  Review of Systems:   General: no fevers, chills, no body weight gain, has fatigue HEENT: no blurry vision, hearing changes or sore throat Respiratory: no dyspnea, coughing, wheezing CV: no chest pain, no palpitations GI: no nausea, vomiting,  abdominal pain, diarrhea, constipation GU: no dysuria, burning on urination, increased urinary frequency, hematuria  Ext: no leg edema Neuro: o vision change or hearing loss. Has left-sided weakness and difficulty speaking Skin: no rash, no skin tear. MSK: No muscle spasm, no deformity, no limitation of range of movement in spin Heme: No easy bruising.  Travel history: No recent long distant travel.  Allergy:  Allergies  Allergen Reactions  . Morphine And Related Anaphylaxis  . Reglan [Metoclopramide]     Other reaction(s): Unknown Elevated BP  . Simvastatin Other (See Comments)    Muscle pain  . Betadine [Povidone Iodine] Rash  . Tetracyclines & Related Rash    Other reaction(s): Unknown    Past Medical History:  Diagnosis Date  . Allergy   . Arthritis   . Atypical chest pain    a. Reportedly nl cath in the past; b. 12/2009 MV: No ischemia/infarct. EF 87%.  . Carotid arterial disease ([place])    a. 12/2374 CTA head/neck: LICA 28B, RICA 15-->VVO rx.  . Cellulitis and abscess of face   . Chronic Dizziness   . Coronary artery calcification seen on CT scan    a. 10/2016 CTA chest: Atherosclerotic calcifications Ao, cors, and prox great vessels.  . Depression   . Diabetes mellitus ([place])   . Diastolic dysfunction    a. 2008 Echo: Nl EF; b. 2011 Echo: Nl EF; c. 06/2016 Echo: EF 55-60%, Gr1DD; d. 03/2018 Echo: EF 55-60%, no rwma, Gr1DD, mod dil LA.  . Edema   . GERD (gastroesophageal reflux disease)   . Headache   . Hematuria   .  Hyperlipidemia   . Hypertension   . IBS (irritable bowel syndrome)   . Migraine   . Morbid obesity ([place])   . Nocturia   . Possible Seizure ([place])    a. 10/2016 ? sz. MRI neg for stroke.  . Stroke ([place]) 2000  . TIA (transient ischemic attack) 2010 & 2019   a. 2010; b. 03/2018 Left sided wkns-->MRI neg for stroke. CTA head/neck: Right A2 cut off/occlusion w/ collats extending over R cerebral convexity. LICA 44B, RICA 45, RSubclavian 50->med rx.  . Urgency  of micturation   . Urinary frequency   . Urinary incontinence     Past Surgical History:  Procedure Laterality Date  . ABDOMINAL HYSTERECTOMY    . ABDOMINOPLASTY    . APPENDECTOMY    . BACK SURGERY     extensive spinal fusion  . carpel tunnel surgery    . CATARACT EXTRACTION     x 2  . CESAREAN SECTION    . CHOLECYSTECTOMY    . COLONOSCOPY  2010  . COLONOSCOPY WITH PROPOFOL N/A 07/11/2016   Procedure: COLONOSCOPY WITH PROPOFOL;  Surgeon: Lucilla Lame, MD;  Location: [facility] ENDOSCOPY;  Service: Endoscopy;  Laterality: N/A;  . COLONOSCOPY WITH PROPOFOL N/A 03/26/2018   Procedure: COLONOSCOPY WITH PROPOFOL;  Surgeon: Lucilla Lame, MD;  Location: [facility] ENDOSCOPY;  Service: Endoscopy;  Laterality: N/A;  . ESOPHAGOGASTRODUODENOSCOPY (EGD) WITH PROPOFOL N/A 03/26/2018   Procedure: ESOPHAGOGASTRODUODENOSCOPY (EGD) WITH PROPOFOL;  Surgeon: Lucilla Lame, MD;  Location: [facility] ENDOSCOPY;  Service: Endoscopy;  Laterality: N/A;  . EYE SURGERY    . HERNIA REPAIR    . SHOULDER SURGERY    . TOE SURGERY    . UPPER GI ENDOSCOPY    . WRIST FRACTURE SURGERY Left     Social History:  reports that she has never smoked. She has never used smokeless tobacco. She reports that she does not drink alcohol or use drugs.  Family History:  Family History  Problem Relation Age of Onset  . Hyperlipidemia Mother   . Arrhythmia Mother        WPW  . Hypertension Mother   . Rheum arthritis Mother   . Heart disease Mother   . Heart attack Father 73  . Hyperlipidemia Father   . Hypertension Father   . Stroke Father   . Diabetes Father   . Heart disease Father   . Coronary artery disease Father   . Diabetes Sister   . Hyperlipidemia Sister   . Hypertension Sister   . Depression Sister   . Depression Sister   . Diabetes Sister   . Hypertension Sister   . Hyperlipidemia Sister   . Kidney disease Paternal Grandmother      Prior to Admission medications   Medication Sig Start Date End Date Taking?  Authorizing Provider  ACCU-CHEK AVIVA PLUS test strip TEST BLOOD SUGAR THREE  TIMES DAILY 08/28/19   Jerrol Banana., MD  ACCU-CHEK SOFTCLIX LANCETS lancets CHECK BLOOD SUGAR THREE TIMES DAILY 04/24/16   Jerrol Banana., MD  amLODipine (NORVASC) 10 MG tablet TAKE 1 TABLET BY MOUTH  DAILY 05/30/19   Jerrol Banana., MD  aspirin 81 MG chewable tablet Chew 1 tablet (81 mg total) by mouth daily. 09/27/18   Dustin Flock, MD  atorvastatin (LIPITOR) 20 MG tablet TAKE 1 TABLET BY MOUTH  DAILY Patient taking differently: Take 20 mg by mouth at bedtime.  06/20/19   Jerrol Banana., MD  Blood Glucose Monitoring Suppl (  ACCU-CHEK AVIVA PLUS) w/Device KIT Check sugar three times daily DX E11.9-needs a meter 11/23/15   Jerrol Banana., MD  cloNIDine (CATAPRES) 0.3 MG tablet TAKE 1 TABLET BY MOUTH  TWICE DAILY 12/18/19   Jerrol Banana., MD  clopidogrel (PLAVIX) 75 MG tablet TAKE 1 TABLET BY MOUTH  DAILY 08/20/19   Jerrol Banana., MD  Continuous Blood Gluc Receiver (FREESTYLE LIBRE 14 DAY READER) DEVI LENGTH OF NEED: LIFETIME - UNLESS SPECIFIED OTHERWISE Patient not taking: Reported on 11/18/2019 06/27/18   Jerrol Banana., MD  Continuous Blood Gluc Sensor (FREESTYLE LIBRE 14 DAY SENSOR) MISC LENGTH OF NEED: LIFETIME - UNLESS SPECIFIED OTHERWISE Patient not taking: Reported on 11/18/2019 06/27/18   Jerrol Banana., MD  enalapril (VASOTEC) 20 MG tablet Take 1 tablet (20 mg total) by mouth 2 (two) times daily. 07/28/19   Jerrol Banana., MD  estradiol (ESTRACE) 0.1 MG/GM vaginal cream APPLY A SMALL AMOUNT TOPICALLY TO URETHRA AREA AT BEDTIME DAILY FOR 1 WEEK THEN THREE TIMES A WEEK FOR 3 WEEKS 07/28/19   [provider]  gabapentin (NEURONTIN) 100 MG capsule Take 200 mg by mouth 2 (two) times daily.  10/14/18   [provider]  hydrALAZINE (APRESOLINE) 100 MG tablet Take 1 tablet (100 mg total) by mouth 2 (two) times daily. 07/28/19   Jerrol Banana., MD  HYDROcodone-acetaminophen [facility]) 5-325 MG tablet Take 2 tablets by mouth every 6 (six) hours as needed for moderate pain. 12/10/19   Jerrol Banana., MD  insulin degludec (TRESIBA FLEXTOUCH) 100 UNIT/ML SOPN FlexTouch Pen ADMINISTER UP TO 30 UNITS UNDER THE SKIN DAILY 09/17/19   Jerrol Banana., MD  Lancet Devices [facility]) lancets Check sugar three times daily DX E11.9 11/23/15   Jerrol Banana., MD  levETIRAcetam (KEPPRA) 500 MG tablet TAKE 1 TABLET BY MOUTH 2  TIMES DAILY 09/15/19   Jerrol Banana., MD  metFORMIN (GLUCOPHAGE) 1000 MG tablet TAKE 1 TABLET BY MOUTH TWO  TIMES DAILY 09/14/19   Jerrol Banana., MD  metoprolol succinate (TOPROL-XL) 100 MG 24 hr tablet TAKE 1 TABLET BY MOUTH  DAILY 09/13/19   Jerrol Banana., MD  nitroGLYCERIN (NITROSTAT) 0.4 MG SL tablet Place 1 tablet (0.4 mg total) under the tongue every 5 (five) minutes as needed for chest pain. 10/11/15   Jerrol Banana., MD  ondansetron (ZOFRAN) 4 MG tablet Take 1 tablet (4 mg total) by mouth every 8 (eight) hours as needed for nausea or vomiting. 06/09/19   Chrismon, Vickki Muff, PA  oxybutynin (DITROPAN) 5 MG tablet Take 1 tablet (5 mg total) by mouth 2 (two) times daily. Patient taking differently: Take 5 mg by mouth 2 (two) times daily as needed.  07/31/17   Jerrol Banana., MD  pantoprazole (PROTONIX) 40 MG tablet TAKE 1 TABLET BY MOUTH  DAILY 03/31/19   Jerrol Banana., MD  polyethylene glycol (MIRALAX) 17 g packet Take 17 g by mouth daily as needed. Patient not taking: Reported on 11/18/2019 11/13/19   Louellen Molder, MD    Physical Exam: Vitals:   12/23/19 1430 12/23/19 1500 12/23/19 1530 12/23/19 1700  BP: (!) 162/55 (!) 164/54 (!) 174/57   Pulse: 62 62 64   Resp: 13  10 15   Temp:      TempSrc:      SpO2: 96% 98% 99%   Weight:  Height:       General: Not in acute distress HEENT:       Eyes: PERRL, EOMI, no scleral icterus.        ENT: No discharge from the ears and nose, no pharynx injection, no tonsillar enlargement.        Neck: No JVD, no bruit, no mass felt. Heme: No neck lymph node enlargement. Cardiac: S1/S2, RRR, No murmurs, No gallops or rubs. Respiratory: No rales, wheezing, rhonchi or rubs. GI: Soft, nondistended, nontender, no rebound pain, no organomegaly, BS present. GU: No hematuria Ext: No pitting leg edema bilaterally. 2+DP/PT pulse bilaterally. Musculoskeletal: No joint deformities, No joint redness or warmth, no limitation of ROM in spin. Skin: No rashes.  Neuro: Alert, oriented X3, cranial nerves II-XII grossly intact, moves all extremities normally. Muscle strength 3/5 in left arm and 1/5 in left leg. 5/5 in right extremities, sensation to light touch intact.  Psych: Patient is not psychotic, no suicidal or hemocidal ideation.  Labs on Admission: I have personally reviewed following labs and imaging studies  CBC: Recent Labs  Lab 12/23/19 1201  WBC 10.3  NEUTROABS 7.1  HGB 12.3  HCT 37.5  MCV 86.6  PLT 480   Basic Metabolic Panel: Recent Labs  Lab 12/23/19 1201  NA 140  K 4.9  CL 111  CO2 19*  GLUCOSE 94  BUN 26*  CREATININE 1.54*  CALCIUM 8.5*   GFR: Estimated Creatinine Clearance: 41 mL/min (A) (by C-G formula based on SCr of 1.54 mg/dL (H)). Liver Function Tests: Recent Labs  Lab 12/23/19 1201  AST 26  ALT 15  ALKPHOS 88  BILITOT 0.5  PROT 6.9  ALBUMIN 3.3*   No results for input(s): LIPASE, AMYLASE in the last 168 hours. No results for input(s): AMMONIA in the last 168 hours. Coagulation Profile: Recent Labs  Lab 12/23/19 1201  INR 1.0   Cardiac Enzymes: No results for input(s): CKTOTAL, CKMB, CKMBINDEX, TROPONINI in the last 168 hours. BNP (last 3 results) No results for input(s): PROBNP in the last 8760 hours. HbA1C: No results for input(s): HGBA1C in the last 72 hours. CBG: Recent Labs  Lab 12/23/19 1656 12/23/19 1657  GLUCAP 49* 54*   Lipid  Profile: No results for input(s): CHOL, HDL, LDLCALC, TRIG, CHOLHDL, LDLDIRECT in the last 72 hours. Thyroid Function Tests: No results for input(s): TSH, T4TOTAL, FREET4, T3FREE, THYROIDAB in the last 72 hours. Anemia Panel: No results for input(s): VITAMINB12, FOLATE, FERRITIN, TIBC, IRON, RETICCTPCT in the last 72 hours. Urine analysis:    Component Value Date/Time   COLORURINE YELLOW 11/10/2019 0230   APPEARANCEUR CLEAR 11/10/2019 0230   APPEARANCEUR Turbid (A) 05/25/2016 1122   LABSPEC 1.020 11/10/2019 0230   LABSPEC 1.014 01/17/2014 1708   PHURINE 5.5 11/10/2019 0230   GLUCOSEU 500 (A) 11/10/2019 0230   GLUCOSEU >=500 01/17/2014 1708   HGBUR NEGATIVE 11/10/2019 0230   BILIRUBINUR NEGATIVE 11/10/2019 0230   BILIRUBINUR Negative 10/30/2019 1632   BILIRUBINUR Negative 05/25/2016 1122   BILIRUBINUR Negative 01/17/2014 1708   KETONESUR NEGATIVE 11/10/2019 0230   PROTEINUR >300 (A) 11/10/2019 0230   UROBILINOGEN 0.2 10/30/2019 1632   UROBILINOGEN 0.2 01/15/2009 0431   NITRITE NEGATIVE 11/10/2019 0230   LEUKOCYTESUR SMALL (A) 11/10/2019 0230   LEUKOCYTESUR 2+ 01/17/2014 1708   Sepsis Labs: @LABRCNTIP (procalcitonin:4,lacticidven:4) ) Recent Results (from the past 240 hour(s))  SARS Coronavirus 2 by RT PCR (hospital order, performed in [place] hospital lab) Nasopharyngeal Nasopharyngeal Swab     Status:  None   Collection Time: 12/23/19  3:19 PM   Specimen: Nasopharyngeal Swab  Result Value Ref Range Status   SARS Coronavirus 2 NEGATIVE NEGATIVE Final    Comment: (NOTE) SARS-CoV-2 target nucleic acids are NOT DETECTED. The SARS-CoV-2 RNA is generally detectable in upper and lower respiratory specimens during the acute phase of infection. The lowest concentration of SARS-CoV-2 viral copies this assay can detect is 250 copies / mL. A negative result does not preclude SARS-CoV-2 infection and should not be used as the sole basis for treatment or other patient management  decisions.  A negative result may occur with improper specimen collection / handling, submission of specimen other than nasopharyngeal swab, presence of viral mutation(s) within the areas targeted by this assay, and inadequate number of viral copies (<250 copies / mL). A negative result must be combined with clinical observations, patient history, and epidemiological information. Fact Sheet for Patients:   StrictlyIdeas.no Fact Sheet for Healthcare Providers: BankingDealers.co.za This test is not yet approved or cleared  by the [place] FDA and has been authorized for detection and/or diagnosis of SARS-CoV-2 by FDA under an Emergency Use Authorization (EUA).  This EUA will remain in effect (meaning this test can be used) for the duration of the COVID-19 declaration under Section 564(b)(1) of the Act, 21 U.S.C. section 360bbb-3(b)(1), unless the authorization is terminated or revoked sooner. Performed at [facility], [address], [place], [place] [address]      Radiological Exams on Admission: CT HEAD WO CONTRAST  Result Date: 12/23/2019 CLINICAL DATA:  Numbness and tingling.  Paresthesias. EXAM: CT HEAD WITHOUT CONTRAST TECHNIQUE: Contiguous axial images were obtained from the base of the skull through the vertex without intravenous contrast. COMPARISON:  11/09/2019 FINDINGS: Brain: No evidence of acute infarction, hemorrhage, extra-axial collection, ventriculomegaly, or mass effect. Old left basal ganglia infarct with ex vacuo dilatation of the frontal horn of the left lateral ventricle. Generalized cerebral atrophy. Periventricular white matter low attenuation likely secondary to microangiopathy. Vascular: Extensive cerebrovascular atherosclerotic calcifications are noted. Skull: Negative for fracture or focal lesion. Sinuses/Orbits: Visualized portions of the orbits are unremarkable. Visualized portions of the paranasal  sinuses and mastoid air cells are unremarkable. Other: None. IMPRESSION: 1. No acute intracranial pathology. 2. Chronic microvascular disease and cerebral atrophy. Electronically Signed   By: Kathreen Devoid   On: 12/23/2019 12:58   MR BRAIN WO CONTRAST  Addendum Date: 12/23/2019   ADDENDUM REPORT: 12/23/2019 17:03 ADDENDUM: Redemonstrated small cortically based infarcts within the posterior right frontal lobe and anterolateral left frontal lobe. Electronically Signed   By: Kellie Simmering DO   On: 12/23/2019 17:03   Result Date: 12/23/2019 CLINICAL DATA:  Focal neuro deficit, greater than 6 hours, stroke suspected. Additional history provided: Left-sided weakness and difficulty finding words. EXAM: MRI HEAD WITHOUT CONTRAST TECHNIQUE: Multiplanar, multiecho pulse sequences of the brain and surrounding structures were obtained without intravenous contrast. COMPARISON:  Noncontrast head CT performed earlier the same day 12/23/2019, brain MRI 09/25/2018 FINDINGS: Brain: Stable, moderate generalized parenchymal atrophy. Stable chronic small vessel ischemic changes within the cerebral white matter. Again demonstrated is a large chronic infarct affecting the left basal ganglia and radiating white matter tracts. Chronic blood products at this site. Associated ex vacuo dilatation of the left lateral ventricle. Redemonstrated chronic lacunar infarct within the right thalamus capsular junction. Also again seen, chronic small vessel ischemic changes and wallerian degeneration affect the pons. There is no acute infarct. No evidence of intracranial mass. No extra-axial  fluid collection. No midline shift. Vascular: Expected proximal arterial flow voids. Skull and upper cervical spine: No focal marrow lesion. Sinuses/Orbits: Visualized orbits show no acute finding. Mild ethmoid and maxillary sinus mucosal thickening. No significant mastoid effusion. IMPRESSION: No evidence of acute intracranial abnormality, including acute  infarction. Stable generalized parenchymal atrophy and chronic small vessel ischemic disease. Redemonstrated extensive chronic infarct affecting the left basal ganglia and radiating white matter tracts. Unchanged chronic lacunar infarct within the right thalamocapsular junction. Chronic small-vessel ischemic changes and wallerian degeneration of the pons. Mild paranasal sinus mucosal thickening. Electronically Signed: By: Kellie Simmering DO On: 12/23/2019 16:59   DG Chest Portable 1 View  Result Date: 12/23/2019 CLINICAL DATA:  Chest pain EXAM: PORTABLE CHEST 1 VIEW COMPARISON:  November 09, 2019 FINDINGS: There is a questionable nipple shadow at the right base. No edema or airspace opacity. Heart size and pulmonary vascular normal. There is mitral annulus calcification. No adenopathy. There is old trauma with remodeling in the right lateral clavicular region. There is arthropathy in each shoulder. IMPRESSION: Suspected nipple shadow right base; advise repeat study with nipple markers to confirm. No edema or airspace opacity. Heart size normal. Prior trauma lateral right clavicular region. Electronically Signed   By: Lowella Grip III M.D.   On: 12/23/2019 14:13     EKG: Independently reviewed.  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  Etiology is not clear.  CT head is negative.  MRI of the brain did not show new stroke.  Dr. Doy Mince of neurology is consulted  -Placed on MedSurg bed for observation - will follow up Neurology's Recs.  - Continue Plavix and add ASA ,  lipitor - fasting lipid panel and HbA1c  - swallowing screen. 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 Patient is taking Metformin and Tresiba at home.  Patient has hypoglycemia with blood sugar 54, which is likely due to not eating yet -Hold Tresiba -SSI -As needed D50  Hypoglycemia: -See above  CKD (chronic kidney disease), stage IIIa: Stable.  Baseline creatinine 1.3-1.5.  Her creatinine is 1.54, BUN 26 -Follow-up by BMP     DVT ppx: SQ Lovenox Code Status: partial code (Discussed with patient and explained the meaning of CODE STATUS. Per her son, patient would want to be DNR)  Family Communication: not done, no family member is at bed side.     Disposition Plan:  Anticipate discharge back to previous environment Consults called: Dr. Doy Mince of neurology Admission status: Med-surg bed for obs  Status is: Observation  The patient remains OBS appropriate and will d/c before 2 midnights.  Dispo: The patient is from: Home  Anticipated d/c is to: Home              Anticipated d/c date is: 1 day              Patient currently is not medically stable to d/c.           Date of Service 12/23/2019    [facility]   If 7PM-7AM, please contact night-coverage [email or web address] 12/23/2019, 5:38 PM

## 2019-12-23 NOTE — ED Notes (Signed)
Pt returned from [place] via stretcher, husband at bedside

## 2019-12-23 NOTE — ED Notes (Signed)
Pt returned from MRI °

## 2019-12-23 NOTE — Telephone Encounter (Signed)
°  Kathlee Nations, physical therapist with [facility] is visiting pt.  She is wondering if pt is having a possible TIA/stroke.  I can hear pt talking in the background to [place] as I'm asking questions.   Her speech is her normal, per pt and appropriate in her responses.  When Kathlee Nations checked her vital signs she thought her pulse felt a little irregular and slower than usual.  Pt is c/o her left leg being numb.   "It's been that way since I got up this morning".  Pt is on a blood thinner per Kathlee Nations.   Pt also has a history of a prior stroke and has diabetes.  Kathlee Nations doesn't know if she can walk or not because she has not tried to get the pt up for therapy with these issues.  I instructed Kathlee Nations to call 911 because if she is having a stroke she needs to get to the hospital as soon as possible.   Both pt and Kathlee Nations agreed to call 911 now.    I sent my notes to [facility] for Dr. Rosanna Randy to be aware of the ED referral.  Pt has an appt today with Carles Collet that will need to be cancelled.     Reason for Disposition  [1] Numbness (i.e., loss of sensation) of the face, arm / hand, or leg / foot on one side of the body AND [2] sudden onset AND [3] present now  Answer Assessment - Initial Assessment Questions 1. SYMPTOM: "What is the main symptom you are concerned about?" (e.g., weakness, numbness)     PT Kathlee Nations is visiting.   HR is irregular.   Having headache on left side.   Left leg feels numb.  Denies chest pain, blurry vision.   Takes a blood thinner.  Had a prior stroke.   Speech appropriate and clear.   Don't know if she can walk because I have not tried to get her up. 2. ONSET: "When did this start?" (minutes, hours, days; while sleeping)     When she got up this morning.     3. LAST NORMAL: "When was the last time you were normal (no symptoms)?"     C/o left leg numbness since Kathlee Nations arrived 4. PATTERN "Does this come and go, or has it been constant since it started?"  "Is it present now?"  constant 5. CARDIAC SYMPTOMS: "Have you had any of the following symptoms: chest pain, difficulty breathing, palpitations?"     Kathlee Nations thought her pulse felt irregular. 6. NEUROLOGIC SYMPTOMS: "Have you had any of the following symptoms: headache, dizziness, vision loss, double vision, changes in speech, unsteady on your feet?"     She has a headache on the left side of her head.   Denies blurry vision.  Don't know if she can walk as Kathlee Nations has not tried to get her up.  Speech is clear and appropriate.   7. OTHER SYMPTOMS: "Do you have any other symptoms?"     She denies any other symptoms. 8. PREGNANCY: "Is there any chance you are pregnant?" "When was your last menstrual period?"     N/A due to age  Protocols used: NEUROLOGIC DEFICIT-A-AH
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
## 2019-12-23 NOTE — ED Notes (Signed)
Pt provided peanut butter, graham crackers, applesauce, and diet ginger ale, ok per Dr. Blaine Hamper

## 2019-12-23 NOTE — ED Notes (Signed)
Pt taken to MRI via stretcher.  

## 2019-12-23 NOTE — ED Notes (Signed)
Pt being screened by MRI on the phone.

## 2019-12-23 NOTE — ED Notes (Signed)
Pt c/o of HA, informed EDP.

## 2019-12-23 NOTE — ED Notes (Signed)
Called lab and spoke to [place] about running troponin on blood down in lab. She stated she could add it on.

## 2019-12-23 NOTE — ED Notes (Signed)
Pt in CT.

## 2019-12-23 NOTE — ED Notes (Signed)
Pt resting quietly. Pt informed of bed assignment.

## 2019-12-24 ENCOUNTER — Encounter: Payer: Self-pay | Admitting: Internal Medicine

## 2019-12-24 ENCOUNTER — Other Ambulatory Visit: Payer: Self-pay

## 2019-12-24 DIAGNOSIS — N1832 Chronic kidney disease, stage 3b: Secondary | ICD-10-CM

## 2019-12-24 DIAGNOSIS — K219 Gastro-esophageal reflux disease without esophagitis: Secondary | ICD-10-CM | POA: Diagnosis not present

## 2019-12-24 DIAGNOSIS — I10 Essential (primary) hypertension: Secondary | ICD-10-CM | POA: Diagnosis not present

## 2019-12-24 DIAGNOSIS — R531 Weakness: Secondary | ICD-10-CM | POA: Diagnosis not present

## 2019-12-24 LAB — URINALYSIS, COMPLETE (UACMP) WITH MICROSCOPIC
Bilirubin Urine: NEGATIVE
Glucose, UA: 50 mg/dL — AB
Hgb urine dipstick: NEGATIVE
Ketones, ur: NEGATIVE mg/dL
Nitrite: NEGATIVE
Protein, ur: >=300 mg/dL — AB
Specific Gravity, Urine: 1.013 (ref 1.005–1.030)
Squamous Epithelial / HPF: NONE SEEN (ref 0–5)
pH: 5 (ref 5.0–8.0)

## 2019-12-24 LAB — BASIC METABOLIC PANEL
Anion gap: 8 (ref 5–15)
BUN: 23 mg/dL (ref 8–23)
CO2: 22 mmol/L (ref 22–32)
Calcium: 8 mg/dL — ABNORMAL LOW (ref 8.9–10.3)
Chloride: 109 mmol/L (ref 98–111)
Creatinine, Ser: 1.4 mg/dL — ABNORMAL HIGH (ref 0.44–1.00)
GFR calc Af Amer: 46 mL/min — ABNORMAL LOW (ref >60)
GFR calc non Af Amer: 40 mL/min — ABNORMAL LOW (ref >60)
Glucose, Bld: 238 mg/dL — ABNORMAL HIGH (ref 70–99)
Potassium: 4.2 mmol/L (ref 3.5–5.1)
Sodium: 139 mmol/L (ref 135–145)

## 2019-12-24 LAB — CBC
HCT: 33.6 % — ABNORMAL LOW (ref 36.0–46.0)
Hemoglobin: 11.1 g/dL — ABNORMAL LOW (ref 12.0–15.0)
MCH: 28.5 pg (ref 26.0–34.0)
MCHC: 33 g/dL (ref 30.0–36.0)
MCV: 86.2 fL (ref 80.0–100.0)
Platelets: 243 10*3/uL (ref 150–400)
RBC: 3.9 MIL/uL (ref 3.87–5.11)
RDW: 13.7 % (ref 11.5–15.5)
WBC: 6.6 10*3/uL (ref 4.0–10.5)
nRBC: 0 % (ref 0.0–0.2)

## 2019-12-24 LAB — TROPONIN I (HIGH SENSITIVITY)
Troponin I (High Sensitivity): 15 ng/L (ref <18)
Troponin I (High Sensitivity): 15 ng/L (ref <18)

## 2019-12-24 LAB — HEMOGLOBIN A1C
Hgb A1c MFr Bld: 9.8 % — ABNORMAL HIGH (ref 4.8–5.6)
Mean Plasma Glucose: 234.56 mg/dL

## 2019-12-24 LAB — GLUCOSE, CAPILLARY
Glucose-Capillary: 149 mg/dL — ABNORMAL HIGH (ref 70–99)
Glucose-Capillary: 194 mg/dL — ABNORMAL HIGH (ref 70–99)
Glucose-Capillary: 212 mg/dL — ABNORMAL HIGH (ref 70–99)
Glucose-Capillary: 216 mg/dL — ABNORMAL HIGH (ref 70–99)
Glucose-Capillary: 224 mg/dL — ABNORMAL HIGH (ref 70–99)
Glucose-Capillary: 233 mg/dL — ABNORMAL HIGH (ref 70–99)

## 2019-12-24 LAB — LIPID PANEL
Cholesterol: 105 mg/dL (ref 0–200)
HDL: 35 mg/dL — ABNORMAL LOW (ref >40)
LDL Cholesterol: 41 mg/dL (ref 0–99)
Total CHOL/HDL Ratio: 3 RATIO
Triglycerides: 144 mg/dL (ref <150)
VLDL: 29 mg/dL (ref 0–40)

## 2019-12-24 MED ORDER — INSULIN GLARGINE 100 UNIT/ML ~~LOC~~ SOLN
15.0000 [IU] | Freq: Every day | SUBCUTANEOUS | Status: DC
  Administered 2019-12-24 – 2019-12-25 (×2): 15 [IU] via SUBCUTANEOUS
  Filled 2019-12-24 (×2): qty 0.15

## 2019-12-24 MED ORDER — PNEUMOCOCCAL VAC POLYVALENT 25 MCG/0.5ML IJ INJ
0.5000 mL | INJECTION | INTRAMUSCULAR | Status: AC
  Administered 2019-12-25: 0.5 mL via INTRAMUSCULAR
  Filled 2019-12-24: qty 0.5

## 2019-12-24 NOTE — Evaluation (Signed)
Occupational Therapy Evaluation Patient Details Name: Susan House MRN: [id number] DOB: 1956/02/03 Today's Date: 12/24/2019    History of Present Illness Susan House is a 64 y.o. female with medical history significant of hypertension, hyperlipidemia, diabetes mellitus, stroke with left-sided weakness, TIA, GERD, hypothyroidism, depression, migraine headache, Seizure, CAD, carotid artery stenosis, CKD stage III, who presents with worsening left-sided weakness. MRI (-) for acute infarct.   Clinical Impression   Pt was seen for OT evaluation this date. Prior to hospital admission, pt was mostly Indep with BADLs, MOD I with fxl mobility and had assist for [facility] IADLs from husband. Pt lives in [facility] with ramped entrance with her spouse. Currently pt demonstrates impairments as described below (See OT problem list) which functionally limit her ability to perform ADL/self-care tasks. Pt currently requires MIN A +2 with RW to stand from elevated surface and MOD A +2 from chair as well as 2p assist for fxl mobility with chair follow for safety with poor eccentric control.  Pt requiring MOD A for static standing ADLs, MAX A for toileting, and MOD A for LB ADLs in sitting. Pt does reports feeling that this is better compared to yesterday and would like to try to go home after hospital if her symptoms continue to resolve. Pt would benefit from skilled OT to address noted impairments and functional limitations (see below for any additional details) in order to maximize safety and independence while minimizing falls risk and caregiver burden. Upon hospital discharge, recommend HHOT and 24/7 supv to maximize pt safety and return to functional independence during meaningful occupations of daily life. Will continue to assess for appropriate d/c recommendation.    Follow Up Recommendations  Home health OT;Supervision/Assistance - 24 hour    Equipment Recommendations  Other (comment)(pt reports having all  necessary equipment)    Recommendations for Other Services       Precautions / Restrictions Precautions Precautions: Fall Restrictions Weight Bearing Restrictions: No      Mobility Bed Mobility Overal bed mobility: Needs Assistance Bed Mobility: Supine to Sit     Supine to sit: Modified independent (Device/Increase time)     General bed mobility comments: assist with lines/leads, no physical assist needed. Patient struggled to perform but was able to do so using bed rails.  Transfers Overall transfer level: Needs assistance Equipment used: Rolling walker (2 wheeled) Transfers: Sit to/from Stand Sit to Stand: +2 physical assistance;Mod assist;From elevated surface;Min assist         General transfer comment: Min +2 from elevated bed, mod +2 from recliner with initial standing lob requiring assist to recover    Balance Overall balance assessment: Needs assistance Sitting-balance support: Feet supported;Single extremity supported Sitting balance-Leahy Scale: Fair     Standing balance support: Bilateral upper extremity supported;During functional activity Standing balance-Leahy Scale: Poor Standing balance comment: requires UE support on RW and physical/external assistance for safety/balance                           ADL either performed or assessed with clinical judgement   ADL Overall ADL's : Needs assistance/impaired Eating/Feeding: Set up;Sitting   Grooming: Wash/dry hands;Wash/dry face;Oral care;Set up;Sitting           Upper Body Dressing : Minimal assistance;Sitting   Lower Body Dressing: Moderate assistance;Sitting/lateral leans Lower Body Dressing Details (indicate cue type and reason): to thread socks Toilet Transfer: Moderate assistance;+2 for safety/equipment Toilet Transfer Details (indicate cue type and  reason): based on clinical obs of t/f to chair Toileting- Clothing Manipulation and Hygiene: Moderate assistance;Maximal  assistance;+2 for physical assistance;Sit to/from stand       Functional mobility during ADLs: Moderate assistance;+2 for safety/equipment;Rolling walker(chair follow and IV mgt as well as cues to self-initiate rest breaks when exhibiting fatigue like UEs shaking.)       Vision Baseline Vision/History: Wears glasses Wears Glasses: At all times Patient Visual Report: No change from baseline       Perception     Praxis      Pertinent Vitals/Pain Pain Assessment: No/denies pain     Hand Dominance Right   Extremity/Trunk Assessment Upper Extremity Assessment Upper Extremity Assessment: Generalized weakness;LUE deficits/detail LUE Deficits / Details: L UE somewhat contracted, with extended time and effort, pt able to extend digits. Wrist stays somewhat radially deviated. pt endorses that this is an issue at baseline, but appears to be somewhat worse at this time. LUE Coordination: decreased fine motor;decreased gross motor   Lower Extremity Assessment Lower Extremity Assessment: Defer to PT evaluation       Communication Communication Communication: Expressive difficulties(difficulty with word finding that pt and spouse report is somewhat worse at this time versus after h/o stroke.)   Cognition Arousal/Alertness: Awake/alert Behavior During Therapy: WFL for tasks assessed/performed Overall Cognitive Status: Within Functional Limits for tasks assessed                                     General Comments       Exercises Other Exercises Other Exercises: OT facilitates educaiton re: role of OT and pt with some familiarity from after previous stroke and on previous hospitalizations. Other Exercises: OT facilitates education re: AAROM for L UE and pt demos good reception.   Shoulder Instructions      Home Living Family/patient expects to be discharged to:: Private residence Living Arrangements: Spouse/significant other Available Help at Discharge:  Family;Available 24 hours/day Type of Home: House Home Access: Ramped entrance     Home Layout: One level     Bathroom Shower/Tub: Astronomer Accessibility: Yes How Accessible: Accessible via walker Home Equipment: Bedside commode;Walker - 2 wheels;Cane - single point;Transport chair;Shower seat - built in;Other (comment)(lift chair)      Lives With: Spouse    Prior Functioning/Environment Level of Independence: Needs assistance  Gait / Transfers Assistance Needed: ambulatory with 2WW, husband pushes her into/out of the house up their ramp in the wheelchair ADL's / Homemaking Assistance Needed: pt able to perform most basic self care I'ly. Is incontinent at baseline and uses briefs. Husband assists for IADLs including driving, getting groceries, etc.   Comments: endorses recent falls        OT Problem List: Decreased strength;Decreased range of motion;Decreased activity tolerance;Impaired balance (sitting and/or standing);Decreased coordination;Impaired tone;Impaired UE functional use;Obesity      OT Treatment/Interventions: Self-care/ADL training;Therapeutic exercise;Energy conservation;DME and/or AE instruction;Therapeutic activities;Patient/family education;Balance training    OT Goals(Current goals can be found in the care plan section) Acute Rehab OT Goals Patient Stated Goal: patient wants to get stronger before going home OT Goal Formulation: With patient/family Time For Goal Achievement: 01/07/20 Potential to Achieve Goals: Good  OT Frequency: Min 2X/week   Barriers to D/C:            Co-evaluation PT/OT/SLP Co-Evaluation/Treatment: Yes Reason for Co-Treatment: Complexity of the patient's impairments (multi-system involvement);For patient/therapist safety;To  address functional/ADL transfers PT goals addressed during session: Mobility/safety with mobility;Balance;Proper use of DME OT goals addressed during session: ADL's and self-care;Proper  use of Adaptive equipment and DME      AM-PAC OT "6 Clicks" Daily Activity     Outcome Measure Help from another person eating meals?: A Little Help from another person taking care of personal grooming?: A Little Help from another person toileting, which includes using toliet, bedpan, or urinal?: A Lot Help from another person bathing (including washing, rinsing, drying)?: A Lot Help from another person to put on and taking off regular upper body clothing?: A Little Help from another person to put on and taking off regular lower body clothing?: A Lot 6 Click Score: 15   End of Session Equipment Utilized During Treatment: Gait belt;Rolling walker Nurse Communication: Mobility status  Activity Tolerance: Patient tolerated treatment well Patient left: in chair;with call bell/phone within reach;with chair alarm set;with family/visitor present  OT Visit Diagnosis: Unsteadiness on feet (R26.81);Other abnormalities of gait and mobility (R26.89);Muscle weakness (generalized) (M62.81)                Time: 7357-8978 OT Time Calculation (min): 45 min Charges:  OT General Charges $OT Visit: 1 Visit OT Evaluation $OT Eval Moderate Complexity: 1 Mod OT Treatments $Self Care/Home Management : 8-22 mins  Gerrianne Scale, MS, OTR/L ascom [phone] 12/24/19, 5:01 PM
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
## 2019-12-24 NOTE — Consult Note (Signed)
Requesting Physician: Manuella Ghazi    Chief Complaint: Worsening left sided weakness  I have been asked by Dr. Manuella Ghazi to see this patient in consultation for TIA.  HPI: Susan House is an 64 y.o. female with medical history significant of hypertension, hyperlipidemia, diabetes mellitus, stroke with residual left-sided weakness, TIA, GERD, hypothyroidism, depression, migraine headache, Seizure, CAD, carotid artery stenosis, CKD stage III, who presents with worsening left-sided weakness.  Patient reports going to bed at baseline on 6/7.  Awakened on 6/8 with worsening left-sided weakness.  She could not use her walker.  She also had mild difficulty speaking which resolved.  Initial NIHSS of 6.  Patient reports she is at baseline today.   Patient reports that recently she felt she had been swelling more on her left side.  She has not done any therapy because of this for the past 2 weeks.     Date last known well: 12/22/2019 Time last known well: Time: 23:00 tPA Given: No: Outside time window  Past Medical History:  Diagnosis Date  . Allergy   . Arthritis   . Atypical chest pain    a. Reportedly nl cath in the past; b. 12/2009 MV: No ischemia/infarct. EF 87%.  . Carotid arterial disease ([place])    a. 09/7104 CTA head/neck: LICA 26R, RICA 48-->NIO rx.  . Cellulitis and abscess of face   . Chronic Dizziness   . Coronary artery calcification seen on CT scan    a. 10/2016 CTA chest: Atherosclerotic calcifications Ao, cors, and prox great vessels.  . Depression   . Diabetes mellitus ([place])   . Diastolic dysfunction    a. 2008 Echo: Nl EF; b. 2011 Echo: Nl EF; c. 06/2016 Echo: EF 55-60%, Gr1DD; d. 03/2018 Echo: EF 55-60%, no rwma, Gr1DD, mod dil LA.  . Edema   . GERD (gastroesophageal reflux disease)   . Headache   . Hematuria   . Hyperlipidemia   . Hypertension   . IBS (irritable bowel syndrome)   . Migraine   . Morbid obesity ([place])   . Nocturia   . Possible Seizure ([place])    a. 10/2016 ? sz. MRI  neg for stroke.  . Stroke ([place]) 2000  . TIA (transient ischemic attack) 2010 & 2019   a. 2010; b. 03/2018 Left sided wkns-->MRI neg for stroke. CTA head/neck: Right A2 cut off/occlusion w/ collats extending over R cerebral convexity. LICA 27O, RICA 45, RSubclavian 50->med rx.  . Urgency of micturation   . Urinary frequency   . Urinary incontinence     Past Surgical History:  Procedure Laterality Date  . ABDOMINAL HYSTERECTOMY    . ABDOMINOPLASTY    . APPENDECTOMY    . BACK SURGERY     extensive spinal fusion  . carpel tunnel surgery    . CATARACT EXTRACTION     x 2  . CESAREAN SECTION    . CHOLECYSTECTOMY    . COLONOSCOPY  2010  . COLONOSCOPY WITH PROPOFOL N/A 07/11/2016   Procedure: COLONOSCOPY WITH PROPOFOL;  Surgeon: Lucilla Lame, MD;  Location: [facility] ENDOSCOPY;  Service: Endoscopy;  Laterality: N/A;  . COLONOSCOPY WITH PROPOFOL N/A 03/26/2018   Procedure: COLONOSCOPY WITH PROPOFOL;  Surgeon: Lucilla Lame, MD;  Location: [facility] ENDOSCOPY;  Service: Endoscopy;  Laterality: N/A;  . ESOPHAGOGASTRODUODENOSCOPY (EGD) WITH PROPOFOL N/A 03/26/2018   Procedure: ESOPHAGOGASTRODUODENOSCOPY (EGD) WITH PROPOFOL;  Surgeon: Lucilla Lame, MD;  Location: [facility] ENDOSCOPY;  Service: Endoscopy;  Laterality: N/A;  . EYE SURGERY    .  HERNIA REPAIR    . SHOULDER SURGERY    . TOE SURGERY    . UPPER GI ENDOSCOPY    . WRIST FRACTURE SURGERY Left     Family History  Problem Relation Age of Onset  . Hyperlipidemia Mother   . Arrhythmia Mother        WPW  . Hypertension Mother   . Rheum arthritis Mother   . Heart disease Mother   . Heart attack Father 84  . Hyperlipidemia Father   . Hypertension Father   . Stroke Father   . Diabetes Father   . Heart disease Father   . Coronary artery disease Father   . Diabetes Sister   . Hyperlipidemia Sister   . Hypertension Sister   . Depression Sister   . Depression Sister   . Diabetes Sister   . Hypertension Sister   . Hyperlipidemia Sister   . Kidney  disease Paternal Grandmother    Social History:  reports that she has never smoked. She has never used smokeless tobacco. She reports that she does not drink alcohol or use drugs.  Allergies:  Allergies  Allergen Reactions  . Morphine And Related Anaphylaxis  . Reglan [Metoclopramide]     Other reaction(s): Unknown Elevated BP  . Simvastatin Other (See Comments)    Muscle pain  . Betadine [Povidone Iodine] Rash  . Tetracyclines & Related Rash    Other reaction(s): Unknown    Medications:  I have reviewed the patient's current medications. Prior to Admission:  Medications Prior to Admission  Medication Sig Dispense Refill Last Dose  . amLODipine (NORVASC) 10 MG tablet TAKE 1 TABLET BY MOUTH  DAILY 90 tablet 3 12/23/2019 at 0800  . aspirin 81 MG chewable tablet Chew 1 tablet (81 mg total) by mouth daily.   12/23/2019 at 0800  . atorvastatin (LIPITOR) 20 MG tablet TAKE 1 TABLET BY MOUTH  DAILY (Patient taking differently: Take 20 mg by mouth at bedtime. ) 90 tablet 3 12/22/2019 at 1800  . clopidogrel (PLAVIX) 75 MG tablet TAKE 1 TABLET BY MOUTH  DAILY 90 tablet 1 12/22/2019 at 1800  . enalapril (VASOTEC) 20 MG tablet Take 1 tablet (20 mg total) by mouth 2 (two) times daily. 180 tablet 3 12/23/2019 at 0800  . gabapentin (NEURONTIN) 100 MG capsule Take 200 mg by mouth 2 (two) times daily.    12/23/2019 at Unknown time  . hydrALAZINE (APRESOLINE) 100 MG tablet Take 1 tablet (100 mg total) by mouth 2 (two) times daily. 180 tablet 2 12/23/2019 at 0800  . HYDROcodone-acetaminophen (NORCO) 5-325 MG tablet Take 2 tablets by mouth every 6 (six) hours as needed for moderate pain. 30 tablet 0 Past Week at PRN  . insulin degludec (TRESIBA FLEXTOUCH) 100 UNIT/ML SOPN FlexTouch Pen ADMINISTER UP TO 30 UNITS UNDER THE SKIN DAILY 9 mL 0 12/22/2019 at 1800  . levETIRAcetam (KEPPRA) 500 MG tablet TAKE 1 TABLET BY MOUTH 2  TIMES DAILY 180 tablet 3 12/23/2019 at 0900  . metFORMIN (GLUCOPHAGE) 1000 MG tablet TAKE 1 TABLET BY  MOUTH TWO  TIMES DAILY 180 tablet 1 12/23/2019 at 0900  . metoprolol succinate (TOPROL-XL) 100 MG 24 hr tablet TAKE 1 TABLET BY MOUTH  DAILY 90 tablet 3 12/23/2019 at 0900  . nitroGLYCERIN (NITROSTAT) 0.4 MG SL tablet Place 1 tablet (0.4 mg total) under the tongue every 5 (five) minutes as needed for chest pain. 25 tablet 0 PRN at PRN  . ondansetron (ZOFRAN) 4 MG tablet Take 1  tablet (4 mg total) by mouth every 8 (eight) hours as needed for nausea or vomiting. 60 tablet 5 PRN at PRN  . pantoprazole (PROTONIX) 40 MG tablet TAKE 1 TABLET BY MOUTH  DAILY 90 tablet 3 12/23/2019 at 0900   Scheduled: . amLODipine  10 mg Oral Daily  . aspirin  81 mg Oral Daily  . atorvastatin  20 mg Oral QHS  . clopidogrel  75 mg Oral Daily  . enalapril  20 mg Oral BID  . enoxaparin (LOVENOX) injection  40 mg Subcutaneous Q12H  . gabapentin  200 mg Oral BID  . hydrALAZINE  100 mg Oral BID  . insulin aspart  0-9 Units Subcutaneous Q4H  . insulin glargine  15 Units Subcutaneous Daily  . levETIRAcetam  500 mg Oral BID  . metoprolol succinate  100 mg Oral Daily  . pantoprazole  40 mg Oral Daily  . [START ON 12/25/2019] pneumococcal 23 valent vaccine  0.5 mL Intramuscular Tomorrow-1000    ROS: History obtained from the patient  General ROS: negative for - chills, fatigue, fever, night sweats, weight gain or weight loss Psychological ROS: negative for - behavioral disorder, hallucinations, memory difficulties, mood swings or suicidal ideation Ophthalmic ROS: negative for - blurry vision, double vision, eye pain or loss of vision ENT ROS: negative for - epistaxis, nasal discharge, oral lesions, sore throat, tinnitus or vertigo Allergy and Immunology ROS: negative for - hives or itchy/watery eyes Hematological and Lymphatic ROS: negative for - bleeding problems, bruising or swollen lymph nodes Endocrine ROS: negative for - galactorrhea, hair pattern changes, polydipsia/polyuria or temperature intolerance Respiratory ROS:  negative for - cough, hemoptysis, shortness of breath or wheezing Cardiovascular ROS: chest pain Gastrointestinal ROS: negative for - abdominal pain, diarrhea, hematemesis, nausea/vomiting or stool incontinence Genito-Urinary ROS: negative for - dysuria, hematuria, incontinence or urinary frequency/urgency Musculoskeletal ROS: negative for - joint swelling or muscular weakness Neurological ROS: as noted in HPI Dermatological ROS: negative for rash and skin lesion changes  Physical Examination: Blood pressure 131/82, pulse 70, temperature 98.1 F (36.7 C), temperature source Oral, resp. rate 13, height 5\' 1"  (1.549 m), weight 102.1 kg, SpO2 94 %.  HEENT-  Normocephalic, no lesions, without obvious abnormality.  Normal external eye and conjunctiva.  Normal TM's bilaterally.  Normal auditory canals and external ears. Normal external nose, mucus membranes and septum.  Normal pharynx. Cardiovascular- S1, S2 normal, pulses palpable throughout   Lungs- chest clear, no wheezing, rales, normal symmetric air entry Abdomen- soft, non-tender; bowel sounds normal; no masses,  no organomegaly Extremities- no edema Lymph-no adenopathy palpable Musculoskeletal-no joint tenderness, deformity or swelling Skin-warm and dry, no hyperpigmentation, vitiligo, or suspicious lesions  Neurological Examination   Mental Status: Alert, oriented, thought content appropriate.  Some words finding difficulties noted.   Mild dysarthria.  Able to follow 3 step commands without difficulty. Cranial Nerves: II: Discs flat bilaterally; LHH, pupils equal, round, reactive to light and accommodation III,IV, VI: ptosis not present, extra-ocular motions intact bilaterally V,VII: mild left facial asymmetry with decreased sensation on the left side of the face VIII: hearing normal bilaterally IX,X: gag reflex present XI: bilateral shoulder shrug XII: midline tongue extension Motor: Right : Upper extremity   5/5    Left:      Upper extremity   3/5  Lower extremity   5/5     Lower extremity   3-/5 Tone and bulk:normal tone throughout; no atrophy noted Sensory: Pinprick and light touch decreased on the left hand Deep  Tendon Reflexes: Smmetric throughout Plantars: Right: mute   Left: upgoing Cerebellar: Finger-to-nosetestingand heel to chinintacton the right. Unable to perform on the left due to hemiparesis and contracture of hand. Gait: not tested due to safety concerns    Laboratory Studies:  Basic Metabolic Panel: Recent Labs  Lab 12/23/19 1201 12/24/19 0513  NA 140 139  K 4.9 4.2  CL 111 109  CO2 19* 22  GLUCOSE 94 238*  BUN 26* 23  CREATININE 1.54* 1.40*  CALCIUM 8.5* 8.0*    Liver Function Tests: Recent Labs  Lab 12/23/19 1201  AST 26  ALT 15  ALKPHOS 88  BILITOT 0.5  PROT 6.9  ALBUMIN 3.3*   No results for input(s): LIPASE, AMYLASE in the last 168 hours. No results for input(s): AMMONIA in the last 168 hours.  CBC: Recent Labs  Lab 12/23/19 1201 12/24/19 0513  WBC 10.3 6.6  NEUTROABS 7.1  --   HGB 12.3 11.1*  HCT 37.5 33.6*  MCV 86.6 86.2  PLT 292 243    Cardiac Enzymes: No results for input(s): CKTOTAL, CKMB, CKMBINDEX, TROPONINI in the last 168 hours.  BNP: Invalid input(s): POCBNP  CBG: Recent Labs  Lab 12/23/19 1657 12/23/19 2019 12/24/19 0047 12/24/19 0441 12/24/19 0745  GLUCAP 54* 198* 149* 224* 212*    Microbiology: Results for orders placed or performed during the hospital encounter of 12/23/19  SARS Coronavirus 2 by RT PCR (hospital order, performed in [facility] hospital lab) Nasopharyngeal Nasopharyngeal Swab     Status: None   Collection Time: 12/23/19  3:19 PM   Specimen: Nasopharyngeal Swab  Result Value Ref Range Status   SARS Coronavirus 2 NEGATIVE NEGATIVE Final    Comment: (NOTE) SARS-CoV-2 target nucleic acids are NOT DETECTED. The SARS-CoV-2 RNA is generally detectable in upper and lower respiratory specimens during the acute  phase of infection. The lowest concentration of SARS-CoV-2 viral copies this assay can detect is 250 copies / mL. A negative result does not preclude SARS-CoV-2 infection and should not be used as the sole basis for treatment or other patient management decisions.  A negative result may occur with improper specimen collection / handling, submission of specimen other than nasopharyngeal swab, presence of viral mutation(s) within the areas targeted by this assay, and inadequate number of viral copies (<250 copies / mL). A negative result must be combined with clinical observations, patient history, and epidemiological information. Fact Sheet for Patients:   StrictlyIdeas.no Fact Sheet for Healthcare Providers: BankingDealers.co.za This test is not yet approved or cleared  by the [place] FDA and has been authorized for detection and/or diagnosis of SARS-CoV-2 by FDA under an Emergency Use Authorization (EUA).  This EUA will remain in effect (meaning this test can be used) for the duration of the COVID-19 declaration under Section 564(b)(1) of the Act, 21 U.S.C. section 360bbb-3(b)(1), unless the authorization is terminated or revoked sooner. Performed at [facility], [address] [place], [place] [address]     Coagulation Studies: Recent Labs    12/23/19 1201  LABPROT 12.4  INR 1.0    Urinalysis:  Recent Labs  Lab 12/24/19 0111  COLORURINE YELLOW*  LABSPEC 1.013  PHURINE 5.0  GLUCOSEU 50*  HGBUR NEGATIVE  BILIRUBINUR NEGATIVE  KETONESUR NEGATIVE  PROTEINUR >=300*  NITRITE NEGATIVE  LEUKOCYTESUR TRACE*    Lipid Panel:    Component Value Date/Time   CHOL 105 12/24/2019 0513   CHOL 110 01/18/2014 0059   TRIG 144 12/24/2019 0513  TRIG 122 01/18/2014 0059   HDL 35 (L) 12/24/2019 0513   HDL 35 (L) 01/18/2014 0059   CHOLHDL 3.0 12/24/2019 0513   VLDL 29 12/24/2019 0513   VLDL 24 01/18/2014 0059    LDLCALC 41 12/24/2019 0513   LDLCALC 51 01/18/2014 0059    HgbA1C:  Lab Results  Component Value Date   HGBA1C 9.8 (H) 12/24/2019    Urine Drug Screen:  No results found for: LABOPIA, COCAINSCRNUR, LABBENZ, AMPHETMU, THCU, LABBARB  Alcohol Level: No results for input(s): ETH in the last 168 hours.  Other results: EKG: sinus rhythm at 70 bpm.  Imaging: CT HEAD WO CONTRAST  Result Date: 12/23/2019 CLINICAL DATA:  Numbness and tingling.  Paresthesias. EXAM: CT HEAD WITHOUT CONTRAST TECHNIQUE: Contiguous axial images were obtained from the base of the skull through the vertex without intravenous contrast. COMPARISON:  11/09/2019 FINDINGS: Brain: No evidence of acute infarction, hemorrhage, extra-axial collection, ventriculomegaly, or mass effect. Old left basal ganglia infarct with ex vacuo dilatation of the frontal horn of the left lateral ventricle. Generalized cerebral atrophy. Periventricular white matter low attenuation likely secondary to microangiopathy. Vascular: Extensive cerebrovascular atherosclerotic calcifications are noted. Skull: Negative for fracture or focal lesion. Sinuses/Orbits: Visualized portions of the orbits are unremarkable. Visualized portions of the paranasal sinuses and mastoid air cells are unremarkable. Other: None. IMPRESSION: 1. No acute intracranial pathology. 2. Chronic microvascular disease and cerebral atrophy. Electronically Signed   By: Kathreen Devoid   On: 12/23/2019 12:58   MR BRAIN WO CONTRAST  Addendum Date: 12/23/2019   ADDENDUM REPORT: 12/23/2019 17:03 ADDENDUM: Redemonstrated small cortically based infarcts within the posterior right frontal lobe and anterolateral left frontal lobe. Electronically Signed   By: Kellie Simmering DO   On: 12/23/2019 17:03   Result Date: 12/23/2019 CLINICAL DATA:  Focal neuro deficit, greater than 6 hours, stroke suspected. Additional history provided: Left-sided weakness and difficulty finding words. EXAM: MRI HEAD WITHOUT  CONTRAST TECHNIQUE: Multiplanar, multiecho pulse sequences of the brain and surrounding structures were obtained without intravenous contrast. COMPARISON:  Noncontrast head CT performed earlier the same day 12/23/2019, brain MRI 09/25/2018 FINDINGS: Brain: Stable, moderate generalized parenchymal atrophy. Stable chronic small vessel ischemic changes within the cerebral white matter. Again demonstrated is a large chronic infarct affecting the left basal ganglia and radiating white matter tracts. Chronic blood products at this site. Associated ex vacuo dilatation of the left lateral ventricle. Redemonstrated chronic lacunar infarct within the right thalamus capsular junction. Also again seen, chronic small vessel ischemic changes and wallerian degeneration affect the pons. There is no acute infarct. No evidence of intracranial mass. No extra-axial fluid collection. No midline shift. Vascular: Expected proximal arterial flow voids. Skull and upper cervical spine: No focal marrow lesion. Sinuses/Orbits: Visualized orbits show no acute finding. Mild ethmoid and maxillary sinus mucosal thickening. No significant mastoid effusion. IMPRESSION: No evidence of acute intracranial abnormality, including acute infarction. Stable generalized parenchymal atrophy and chronic small vessel ischemic disease. Redemonstrated extensive chronic infarct affecting the left basal ganglia and radiating white matter tracts. Unchanged chronic lacunar infarct within the right thalamocapsular junction. Chronic small-vessel ischemic changes and wallerian degeneration of the pons. Mild paranasal sinus mucosal thickening. Electronically Signed: By: Kellie Simmering DO On: 12/23/2019 16:59   DG Chest Portable 1 View  Result Date: 12/23/2019 CLINICAL DATA:  Chest pain EXAM: PORTABLE CHEST 1 VIEW COMPARISON:  November 09, 2019 FINDINGS: There is a questionable nipple shadow at the right base. No edema or airspace opacity. Heart size  and pulmonary vascular  normal. There is mitral annulus calcification. No adenopathy. There is old trauma with remodeling in the right lateral clavicular region. There is arthropathy in each shoulder. IMPRESSION: Suspected nipple shadow right base; advise repeat study with nipple markers to confirm. No edema or airspace opacity. Heart size normal. Prior trauma lateral right clavicular region. Electronically Signed   By: Lowella Grip III M.D.   On: 12/23/2019 14:13    Assessment: 64 y.o. female with medical history significant of hypertension, hyperlipidemia, diabetes mellitus, stroke with residual left-sided weakness, TIA, GERD, hypothyroidism, depression, migraine headache, Seizure, CAD, carotid artery stenosis, CKD stage III, who presents with worsening left-sided weakness.  Reports she is back to baseline today.  MRI of the brain personally reviewed and shows no evidence of acute changes.  Known chronic infarcts noted.  Patient on ASA, Plavix and statin.  Suspect worsening may be secondary to elevated BP versus or in combination with decreased activity versus unwitnessed seizure with residual Todd's.   A1c 9.8, LDL 41.  Stroke Risk Factors - carotid stenosis, diabetes mellitus, hyperlipidemia and hypertension  Plan: 1. Continue ASA, Plavix and statin 2. PT consult, OT consult, Speech consult 3. Blood sugar management with target A1c<7.0 4. BP control 5. Telemetry monitoring 6. Frequent neuro checks 7. Continue Keppra at current dose  No further neurologic intervention is recommended at this time.  If further questions arise, please call or page at that time.  Thank you for allowing neurology to participate in the care of this patient.  Alexis Goodell, MD Neurology [phone] 12/24/2019, 10:48 AM

## 2019-12-24 NOTE — Progress Notes (Signed)
Inpatient Diabetes Program Recommendations  AACE/ADA: New Consensus Statement on Inpatient Glycemic Control   Target Ranges:  Prepandial:   less than 140 mg/dL      Peak postprandial:   less than 180 mg/dL (1-2 hours)      Critically ill patients:  140 - 180 mg/dL   Results for [name], MENNA ABELN" (MRN [id number]) as of 12/24/2019 08:53  Ref. Range 12/23/2019 16:56 12/23/2019 16:57 12/23/2019 20:19 12/24/2019 00:47 12/24/2019 04:41 12/24/2019 07:45  Glucose-Capillary Latest Ref Range: 70 - 99 mg/dL 49 (L) 54 (L) 198 (H) 149 (H) 224 (H) 212 (H)   Review of Glycemic Control  Diabetes history: DM2 Outpatient Diabetes medications: Tresiba 30 units daily, Metformin 100 mg BID Current orders for Inpatient glycemic control: Novolog 0-9 units Q4H  Inpatient Diabetes Program Recommendations:   Insulin-Basal: Please consider ordering Lantus 15 units daily.  Insulin-Correction: If patient is eating, please consider changing CBGs to AC&HS and Novolog to 0-9 units TID with meals and Novolog 0-5 units QHS.  Thanks, Barnie Alderman, RN, MSN, CDE Diabetes Coordinator Inpatient Diabetes Program [phone] (Team Pager from 8am to 5pm)

## 2019-12-24 NOTE — Progress Notes (Signed)
1        [place] [facility]   PATIENT NAME: Susan House    MR#:  [id number]  DATE OF BIRTH:  [birth year]  SUBJECTIVE:  CHIEF COMPLAINT:   Chief Complaint  Patient presents with  . Weakness  Persistent left-sided weakness although seems close to her baseline REVIEW OF SYSTEMS:  Review of Systems  Constitutional: Negative for diaphoresis, fever, malaise/fatigue and weight loss.  HENT: Negative for ear discharge, ear pain, hearing loss, nosebleeds, sore throat and tinnitus.   Eyes: Negative for blurred vision and pain.  Respiratory: Negative for cough, hemoptysis, shortness of breath and wheezing.   Cardiovascular: Negative for chest pain, palpitations, orthopnea and leg swelling.  Gastrointestinal: Negative for abdominal pain, blood in stool, constipation, diarrhea, heartburn, nausea and vomiting.  Genitourinary: Negative for dysuria, frequency and urgency.  Musculoskeletal: Negative for back pain and myalgias.  Skin: Negative for itching and rash.  Neurological: Positive for focal weakness. Negative for dizziness, tingling, tremors, seizures, weakness and headaches.  Psychiatric/Behavioral: Negative for depression. The patient is not nervous/anxious.    DRUG ALLERGIES:   Allergies  Allergen Reactions  . Morphine And Related Anaphylaxis  . Reglan [Metoclopramide]     Other reaction(s): Unknown Elevated BP  . Simvastatin Other (See Comments)    Muscle pain  . Betadine [Povidone Iodine] Rash  . Tetracyclines & Related Rash    Other reaction(s): Unknown   VITALS:  Blood pressure (!) 151/81, pulse 78, temperature 98.4 F (36.9 C), temperature source Oral, resp. rate 16, height 5\' 1"  (1.549 m), weight 102.1 kg, SpO2 96 %. PHYSICAL EXAMINATION:  Physical Exam HENT:     Head: Normocephalic and atraumatic.  Eyes:     Conjunctiva/sclera: Conjunctivae normal.     Pupils: Pupils are equal, round, and reactive to light.  Neck:     Thyroid: No thyromegaly.      Trachea: No tracheal deviation.  Cardiovascular:     Rate and Rhythm: Normal rate and regular rhythm.     Heart sounds: Normal heart sounds.  Pulmonary:     Effort: Pulmonary effort is normal. No respiratory distress.     Breath sounds: Normal breath sounds. No wheezing.  Chest:     Chest wall: No tenderness.  Abdominal:     General: Bowel sounds are normal. There is no distension.     Palpations: Abdomen is soft.     Tenderness: There is no abdominal tenderness.  Musculoskeletal:        General: Normal range of motion.     Cervical back: Normal range of motion and neck supple.  Skin:    General: Skin is warm and dry.     Findings: No rash.  Neurological:     Mental Status: She is alert and oriented to person, place, and time.     Cranial Nerves: No cranial nerve deficit.     Motor: Weakness present.     Comments: Left upper and lower extremity 3 out of 5 strength    LABORATORY PANEL:  Female CBC Recent Labs  Lab 12/24/19 0513  WBC 6.6  HGB 11.1*  HCT 33.6*  PLT 243   ------------------------------------------------------------------------------------------------------------------ Chemistries  Recent Labs  Lab 12/23/19 1201 12/23/19 1201 12/24/19 0513  NA 140   < > 139  K 4.9   < > 4.2  CL 111   < > 109  CO2 19*   < > 22  GLUCOSE 94   < > 238*  BUN 26*   < >  23  CREATININE 1.54*   < > 1.40*  CALCIUM 8.5*   < > 8.0*  AST 26  --   --   ALT 15  --   --   ALKPHOS 88  --   --   BILITOT 0.5  --   --    < > = values in this interval not displayed.   RADIOLOGY:  MR BRAIN WO CONTRAST  Addendum Date: 12/23/2019   ADDENDUM REPORT: 12/23/2019 17:03 ADDENDUM: Redemonstrated small cortically based infarcts within the posterior right frontal lobe and anterolateral left frontal lobe. Electronically Signed   By: Kellie Simmering DO   On: 12/23/2019 17:03   Result Date: 12/23/2019 CLINICAL DATA:  Focal neuro deficit, greater than 6 hours, stroke suspected. Additional history  provided: Left-sided weakness and difficulty finding words. EXAM: MRI HEAD WITHOUT CONTRAST TECHNIQUE: Multiplanar, multiecho pulse sequences of the brain and surrounding structures were obtained without intravenous contrast. COMPARISON:  Noncontrast head CT performed earlier the same day 12/23/2019, brain MRI 09/25/2018 FINDINGS: Brain: Stable, moderate generalized parenchymal atrophy. Stable chronic small vessel ischemic changes within the cerebral white matter. Again demonstrated is a large chronic infarct affecting the left basal ganglia and radiating white matter tracts. Chronic blood products at this site. Associated ex vacuo dilatation of the left lateral ventricle. Redemonstrated chronic lacunar infarct within the right thalamus capsular junction. Also again seen, chronic small vessel ischemic changes and wallerian degeneration affect the pons. There is no acute infarct. No evidence of intracranial mass. No extra-axial fluid collection. No midline shift. Vascular: Expected proximal arterial flow voids. Skull and upper cervical spine: No focal marrow lesion. Sinuses/Orbits: Visualized orbits show no acute finding. Mild ethmoid and maxillary sinus mucosal thickening. No significant mastoid effusion. IMPRESSION: No evidence of acute intracranial abnormality, including acute infarction. Stable generalized parenchymal atrophy and chronic small vessel ischemic disease. Redemonstrated extensive chronic infarct affecting the left basal ganglia and radiating white matter tracts. Unchanged chronic lacunar infarct within the right thalamocapsular junction. Chronic small-vessel ischemic changes and wallerian degeneration of the pons. Mild paranasal sinus mucosal thickening. Electronically Signed: By: Kellie Simmering DO On: 12/23/2019 16:59   DG Chest Portable 1 View  Result Date: 12/23/2019 CLINICAL DATA:  Chest pain EXAM: PORTABLE CHEST 1 VIEW COMPARISON:  November 09, 2019 FINDINGS: There is a questionable nipple shadow  at the right base. No edema or airspace opacity. Heart size and pulmonary vascular normal. There is mitral annulus calcification. No adenopathy. There is old trauma with remodeling in the right lateral clavicular region. There is arthropathy in each shoulder. IMPRESSION: Suspected nipple shadow right base; advise repeat study with nipple markers to confirm. No edema or airspace opacity. Heart size normal. Prior trauma lateral right clavicular region. Electronically Signed   By: Lowella Grip III M.D.   On: 12/23/2019 14:13   ASSESSMENT AND PLAN:   Left-sided weakness and hx of stroke: Patient has a worsening left sided weakness.  Etiology is not clear.  CT head is negative.  MRI of the brain did not show new stroke.  Dr. 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 Patient is taking Metformin and Tresiba at home.  Patient has hypoglycemia with blood sugar 54, which is likely due to not eating yet -Hold Tresiba -SSI -As needed D50  Hypoglycemia: -See above  CKD (chronic kidney disease), stage IIIa: Stable.  Baseline creatinine 1.3-1.5.  Her creatinine is 1.54, BUN 26 -Follow-up by BMP       Status is: Observation  The patient remains OBS appropriate and will d/c before 2 midnights.  Dispo: The patient is from: Home               Anticipated d/c is to: Home              Anticipated d/c date is: 1 day              Patient currently is medically stable to d/c.  Family refusing to take her home today.  Would like to watch her 1 more night per husband to get physical therapy here       DVT prophylaxis: Lovenox Family Communication: Updated husband and daughter over phone   All the records are reviewed and case discussed with Care Management/Social Worker. Management plans discussed with the patient, family and they are in agreement.  CODE STATUS: Partial Code  TOTAL TIME TAKING CARE OF THIS PATIENT: 35 minutes.   More than 50% of the time was spent in counseling/coordination of care: YES  POSSIBLE D/C IN 1 DAYS, DEPENDING ON CLINICAL CONDITION.   Max Sane M.D on 12/24/2019 at 12:50 PM  [facility]   CC: Primary care physician; Jerrol Banana., MD  Note: This dictation was prepared with Dragon dictation along with smaller phrase technology. Any transcriptional errors that result from this process are unintentional.

## 2019-12-24 NOTE — Care Management Obs Status (Signed)
[place] NOTIFICATION   Patient Details  Name: Susan House MRN: [id number] Date of Birth: [birth year]   Medicare Observation Status Notification Given:  Yes    Shelbie Hutching, RN 12/24/2019, 2:41 PM

## 2019-12-24 NOTE — Evaluation (Signed)
Physical Therapy Evaluation Patient Details Name: Daphanie Oquendo MRN: [id number] DOB: 05-Dec-1955 Today's Date: 12/24/2019   History of Present Illness  Denisa Enterline is a 64 y.o. female with medical history significant of hypertension, hyperlipidemia, diabetes mellitus, stroke with left-sided weakness, TIA, GERD, hypothyroidism, depression, migraine headache, Seizure, CAD, carotid artery stenosis, CKD stage III, who presents with worsening left-sided weakness.  Clinical Impression  Patient received in bed, agrees to PT session. She continues to report increased L side weakness and word finding difficulties. She was able to perform bed mobility with mod independence, heavy use of rails and increased time and effort. She ambulated to recliner 3' with RW and min assist. Then ambulated another 10 feet with RW and min A, chair to follow due to weakness. She required mod +2 for sit to stand from recliner. She will continue to benefit from skilled PT while here to improve strength and functional independence to return home safely.       Follow Up Recommendations Home health PT;Supervision/Assistance - 24 hour    Equipment Recommendations  None recommended by PT    Recommendations for Other Services       Precautions / Restrictions Precautions Precautions: Fall Restrictions Weight Bearing Restrictions: No      Mobility  Bed Mobility Overal bed mobility: Needs Assistance Bed Mobility: Supine to Sit     Supine to sit: Modified independent (Device/Increase time)     General bed mobility comments: assist with lines/leads, no physical assist needed. Patient struggled to perform but was able to do so using bed rails.  Transfers Overall transfer level: Needs assistance Equipment used: Rolling walker (2 wheeled) Transfers: Sit to/from Stand Sit to Stand: +2 physical assistance;Mod assist;From elevated surface;Min assist         General transfer comment: Min +2 from elevated  bed, mod +2 from recliner with initial standing lob requiring assist to recover  Ambulation/Gait Ambulation/Gait assistance: Min assist;+2 safety/equipment Gait Distance (Feet): 10 Feet   Gait Pattern/deviations: Step-to pattern;Decreased step length - right;Decreased step length - left;Shuffle Gait velocity: decreased   General Gait Details: Ambulated bed to recliner 3', then an additional 10 feet with chair follow due to weakness.  Stairs            Wheelchair Mobility    Modified Rankin (Stroke Patients Only)       Balance Overall balance assessment: Needs assistance Sitting-balance support: Feet supported;Single extremity supported Sitting balance-Leahy Scale: Fair     Standing balance support: Bilateral upper extremity supported;During functional activity Standing balance-Leahy Scale: Poor Standing balance comment: requires RW and assistance for safety                             Pertinent Vitals/Pain Pain Assessment: No/denies pain    Home Living Family/patient expects to be discharged to:: Private residence Living Arrangements: Spouse/significant other Available Help at Discharge: Family;Available 24 hours/day Type of Home: House Home Access: Ramped entrance     Home Layout: One level Home Equipment: Bedside commode;Walker - 2 wheels;Cane - single point;Transport chair;Shower seat - built in      Prior Function Level of Independence: Independent with assistive device(s)         Comments: Patient was ambulating with RW. Recent falls. Requires assist from husband for ADLs, cooking, cleaning     Hand Dominance   Dominant Hand: Right    Extremity/Trunk Assessment   Upper Extremity Assessment Upper Extremity Assessment:  Defer to OT evaluation    Lower Extremity Assessment Lower Extremity Assessment: LLE deficits/detail LLE Deficits / Details: weakness in quads, hip flexors, increased tone with knee extension LLE Sensation:  WNL LLE Coordination: decreased gross motor       Communication   Communication: Expressive difficulties  Cognition Arousal/Alertness: Awake/alert Behavior During Therapy: WFL for tasks assessed/performed Overall Cognitive Status: Within Functional Limits for tasks assessed                                 General Comments: very pleasant, motivated to improve and be independent      General Comments      Exercises     Assessment/Plan    PT Assessment Patient needs continued PT services  PT Problem List Decreased strength;Decreased mobility;Decreased safety awareness;Decreased activity tolerance;Decreased balance       PT Treatment Interventions DME instruction;Therapeutic exercise;Gait training;Balance training;Functional mobility training;Therapeutic activities;Patient/family education;Neuromuscular re-education    PT Goals (Current goals can be found in the Care Plan section)  Acute Rehab PT Goals Patient Stated Goal: patient wants to get stronger before going home PT Goal Formulation: With patient/family Time For Goal Achievement: 12/31/19 Potential to Achieve Goals: Fair    Frequency Min 3X/week   Barriers to discharge Decreased caregiver support      Co-evaluation               AM-PAC PT "6 Clicks" Mobility  Outcome Measure Help needed turning from your back to your side while in a flat bed without using bedrails?: A Little Help needed moving from lying on your back to sitting on the side of a flat bed without using bedrails?: A Little Help needed moving to and from a bed to a chair (including a wheelchair)?: A Lot Help needed standing up from a chair using your arms (e.g., wheelchair or bedside chair)?: A Lot Help needed to walk in hospital room?: A Little Help needed climbing 3-5 steps with a railing? : Total 6 Click Score: 14    End of Session Equipment Utilized During Treatment: Gait belt Activity Tolerance: Patient limited by  lethargy Patient left: in chair;with call bell/phone within reach;with family/visitor present;with chair alarm set Nurse Communication: Mobility status PT Visit Diagnosis: Unsteadiness on feet (R26.81);Other abnormalities of gait and mobility (R26.89);Difficulty in walking, not elsewhere classified (R26.2);Muscle weakness (generalized) (M62.81);History of falling (Z91.81);Hemiplegia and hemiparesis Hemiplegia - Right/Left: Left Hemiplegia - dominant/non-dominant: Non-dominant Hemiplegia - caused by: Cerebral infarction    Time: 5621-3086 PT Time Calculation (min) (ACUTE ONLY): 45 min   Charges:   PT Evaluation $PT Eval Moderate Complexity: 1 Mod PT Treatments $Gait Training: 8-22 mins        Tyronn Golda, PT, GCS 12/24/19,12:10 PM

## 2019-12-25 DIAGNOSIS — I10 Essential (primary) hypertension: Secondary | ICD-10-CM | POA: Diagnosis not present

## 2019-12-25 DIAGNOSIS — R531 Weakness: Secondary | ICD-10-CM | POA: Diagnosis not present

## 2019-12-25 DIAGNOSIS — N1832 Chronic kidney disease, stage 3b: Secondary | ICD-10-CM | POA: Diagnosis not present

## 2019-12-25 LAB — GLUCOSE, CAPILLARY
Glucose-Capillary: 187 mg/dL — ABNORMAL HIGH (ref 70–99)
Glucose-Capillary: 192 mg/dL — ABNORMAL HIGH (ref 70–99)
Glucose-Capillary: 197 mg/dL — ABNORMAL HIGH (ref 70–99)

## 2019-12-25 MED ORDER — HYDRALAZINE HCL 100 MG PO TABS: 100.0000 mg | ORAL_TABLET | Freq: Three times a day (TID) | ORAL | 2 refills | Status: DC

## 2019-12-25 MED ORDER — ENOXAPARIN SODIUM 40 MG/0.4ML ~~LOC~~ SOLN: 40.0000 mg | SUBCUTANEOUS | Status: DC

## 2019-12-25 NOTE — Progress Notes (Signed)
PHARMACIST - PHYSICIAN COMMUNICATION  CONCERNING:  Enoxaparin (Lovenox) for DVT Prophylaxis    RECOMMENDATION: Patient was prescribed enoxaprin 40mg  q12 hours for VTE prophylaxis.   Filed Weights   12/23/19 1202 12/25/19 0601  Weight: 102.1 kg (225 lb) 80 kg (176 lb 5.9 oz)    Body mass index is 33.32 kg/m.   (weight updated in system from 102kg to 80kg, this reduced BMI from 42.54 to 33.34)  Estimated Creatinine Clearance: 39.4 mL/min (A) (by C-G formula based on SCr of 1.4 mg/dL (H)).   Based on [place] patient is candidate for enoxaparin 40mg  every 24 hour dosing due to BMI being <40.   DESCRIPTION: Pharmacy has adjusted enoxaparin dose per [facility] policy.  Patient is now receiving enoxaparin 40mg  every 24 hours.   Lu Duffel, PharmD, BCPS Clinical Pharmacist 12/25/2019 8:42 AM;

## 2019-12-25 NOTE — Discharge Instructions (Signed)

## 2019-12-25 NOTE — Progress Notes (Addendum)
Inpatient Diabetes Program Recommendations  AACE/ADA: New Consensus Statement on Inpatient Glycemic Control (2015)  Target Ranges:  Prepandial:   less than 140 mg/dL      Peak postprandial:   less than 180 mg/dL (1-2 hours)      Critically ill patients:  140 - 180 mg/dL   Lab Results  Component Value Date   GLUCAP 197 (H) 12/25/2019   HGBA1C 9.8 (H) 12/24/2019    Review of Glycemic Control Results for [facility], Susan FUQUAY" (MRN [id number]) as of 12/25/2019 09:44  Ref. Range 12/24/2019 16:56 12/24/2019 20:42 12/25/2019 00:08 12/25/2019 03:59 12/25/2019 07:46  Glucose-Capillary Latest Ref Range: 70 - 99 mg/dL 194 (H) 233 (H) 187 (H) 192 (H) 197 (H)    Diabetes history: DM2  Outpatient Diabetes medications: Tresiba 30 units daily, Metformin 100 mg BID  Current orders for Inpatient glycemic control:  Novolog 0-9 units Q4H  Inpatient Diabetes Program Recommendations:    Lantus 18 units daily  Novolog 0-9 units tid Novolog 0-5 units qhs   Addendum @ 1050-Spoke with patient and spouse at bedside.  She had difficulty answering questions so spouse assisted.    Reviewed patient's current A1c of 9.8% (average blood sugar of 235 mg/dl). Explained what a A1c is and what it measures. Also reviewed goal A1c with patient, importance of good glucose control @ home, and blood sugar goals.  Patient states she takes the above outpatient medications at home as prescribed.  She states she takes her [place] 1-2 times a day depending on her blood sugar.  She does not have an endocrinologist.  Her PCP manages her diabetes.  Reviewed CHO's and importance of limiting them to 50-60 CHO per meal.  Reviewed The Plate Method.  Patient verbalizes understanding of foods that contain CHO's.  She monitors her CBG's 2 times per day.  Readings are usually >200 mg/dl.  Encouraged follow up with PCP as insulin adjustments are likely needed to decrease A1C.  Plan is for patient to DC to home today with  husband.   Will continue to follow while inpatient.  Thank you, Reche Dixon, RN, BSN Diabetes Coordinator Inpatient Diabetes Program [phone] (team pager from 8a-5p)

## 2019-12-25 NOTE — Progress Notes (Signed)
Physical Therapy Treatment Patient Details Name: Susan House MRN: [id number] DOB: 1955/10/28 Today's Date: 12/25/2019    History of Present Illness Alera Quevedo is a 64 y.o. female with medical history significant of hypertension, hyperlipidemia, diabetes mellitus, stroke with left-sided weakness, TIA, GERD, hypothyroidism, depression, migraine headache, Seizure, CAD, carotid artery stenosis, CKD stage III, who presents with worsening left-sided weakness. MRI (-) for acute infarct.    PT Comments    Pt awake in bed upon arrival to room and agreeable to participate in PT this morning. Pt required min A for trunk elevation to come into sitting position on edge of bed and continues to require increased time to perform task.  Two attempts needed to come into standing from bed requiring mod+2 A with verbal cues to power through LEs. Pt able to ambulate 2 feet from bed to recliner chair using RW and min A for steadying, walker management, and line management. Further distance limited secondary to elevated vitals including HR increase to 149 with RR at 25. Min A for eccentric control when sitting to recliner chair. Pt noted with difficulty grasping RW with left hand. Pt and husband educated on need for wheelchair use for safety as pt has difficulty grasping RW with left hand and vitals elevate with activity.   Follow Up Recommendations  Home health PT     Equipment Recommendations  None recommended by PT    Recommendations for Other Services       Precautions / Restrictions Precautions Precautions: Fall Restrictions Weight Bearing Restrictions: No    Mobility  Bed Mobility Overal bed mobility: Needs Assistance Bed Mobility: Supine to Sit     Supine to sit: Min assist     General bed mobility comments: requires increased time to perform task, needs assist with bringing trunk to full upright, utilized therapist's hand to come into sitting  Transfers Overall transfer  level: Needs assistance Equipment used: Rolling walker (2 wheeled) Transfers: Sit to/from Stand Sit to Stand: +2 physical assistance;Mod assist;From elevated surface         General transfer comment: Mod+2 from elevated bed, required 2 attempts to come into standing, stand to sit required min A for eccentric control on descent (Difficulty with left hand placement and with use of RW.)  Ambulation/Gait Ambulation/Gait assistance: Min assist Gait Distance (Feet): 2 Feet Assistive device: Rolling walker (2 wheeled) Gait Pattern/deviations: Decreased step length - right;Decreased step length - left;Step-to pattern;Shuffle Gait velocity: decreased   General Gait Details: ambulated from bed to recliner chair, pt limited secondary to increased HR reaching 149 while upright with RR 25   Stairs             Wheelchair Mobility    Modified Rankin (Stroke Patients Only)       Balance Overall balance assessment: Needs assistance Sitting-balance support: Feet supported Sitting balance-Leahy Scale: Fair     Standing balance support: During functional activity Standing balance-Leahy Scale: Poor                              Cognition Arousal/Alertness: Awake/alert Behavior During Therapy: WFL for tasks assessed/performed Overall Cognitive Status: Within Functional Limits for tasks assessed                                 General Comments: pt continues to have difficulty with word finding however remains pleasant  during treatment      Exercises General Exercises - Lower Extremity Ankle Circles/Pumps: 10 reps;Both Hip ABduction/ADduction: 10 reps;Left;AAROM;Right;AROM Straight Leg Raises: 10 reps;Left;AAROM;Right;AROM    General Comments  Husband present in room for session. Pt has expressive difficulties including answering questions, taking additional time and having word finding trouble. Planning for d/c today.      Pertinent Vitals/Pain Pain  Assessment: Faces Faces Pain Scale: Hurts little more Pain Location: left wrist Pain Descriptors / Indicators: Other (Comment) Pain Intervention(s): Monitored during session    Home Living                      Prior Function            PT Goals (current goals can now be found in the care plan section) Acute Rehab PT Goals Patient Stated Goal: patient wants to get stronger before going home PT Goal Formulation: With patient/family Time For Goal Achievement: 12/31/19 Potential to Achieve Goals: Fair Progress towards PT goals: Progressing toward goals    Frequency    7X/week      PT Plan Current plan remains appropriate    Co-evaluation     PT goals addressed during session: Mobility/safety with mobility;Strengthening/ROM        AM-PAC PT "6 Clicks" Mobility   Outcome Measure  Help needed turning from your back to your side while in a flat bed without using bedrails?: A Little Help needed moving from lying on your back to sitting on the side of a flat bed without using bedrails?: A Little Help needed moving to and from a bed to a chair (including a wheelchair)?: A Lot Help needed standing up from a chair using your arms (e.g., wheelchair or bedside chair)?: A Lot Help needed to walk in hospital room?: A Little Help needed climbing 3-5 steps with a railing? : A Lot 6 Click Score: 15    End of Session Equipment Utilized During Treatment: Gait belt Activity Tolerance: Treatment limited secondary to medical complications (Comment) Patient left: in chair;with call bell/phone within reach;with chair alarm set;with family/visitor present Nurse Communication: Mobility status PT Visit Diagnosis: Unsteadiness on feet (R26.81);Muscle weakness (generalized) (M62.81);Hemiplegia and hemiparesis Hemiplegia - Right/Left: Left Hemiplegia - caused by: Cerebral infarction     Time: 0902-0931 PT Time Calculation (min) (ACUTE ONLY): 29 min  Charges:                        Vale Haven, SPT   Vale Haven 12/25/2019, 1:33 PM

## 2019-12-25 NOTE — Progress Notes (Signed)
IV removed before discharge. Went over discharge instructions and pt stated that she understood. All questions answered. Patient going home POV with husband.

## 2019-12-25 NOTE — Telephone Encounter (Signed)
Received form

## 2019-12-25 NOTE — TOC Transition Note (Signed)
Transition of Care [facility]) - CM/SW Discharge Note   Patient Details  Name: Susan House MRN: [id number] Date of Birth: 21-Jun-1956  Transition of [facility]) CM/SW Contact:  Shelbie Hutching, RN Phone Number: 12/25/2019, 12:00 PM   Clinical Narrative:    Patient is medically ready to discharge home with family.  Patient will have home health services with [facility].  Sharmon Revere with [facility] is aware of DC today.  Patient will need PT, OT, speech and social work.  Patient's husband is providing transportation.  Patient has all needed DME at home.    Final next level of care: [place] Barriers to Discharge: Barriers Resolved   Patient Goals and CMS Choice Patient states their goals for this hospitalization and ongoing recovery are:: Wants her left leg to work better [employer] Medicare.gov Compare Post Acute Care list provided to:: Patient Choice offered to / list presented to : Patient  Discharge Placement                       Discharge Plan and Services   Discharge Planning Services: CM Consult Post Acute Care Choice: Home Health, Resumption of Svcs/PTA Provider                    HH Arranged: PT, OT, Social Work, Theme park manager Therapy HH Agency: [place] Date [place]: 12/25/19 Time [place]: 1159 Representative spoke with at [place]: [place] ([place]) Interventions     Readmission Risk Interventions No flowsheet data found.

## 2019-12-26 DIAGNOSIS — E1142 Type 2 diabetes mellitus with diabetic polyneuropathy: Secondary | ICD-10-CM | POA: Diagnosis not present

## 2019-12-26 DIAGNOSIS — E1151 Type 2 diabetes mellitus with diabetic peripheral angiopathy without gangrene: Secondary | ICD-10-CM | POA: Diagnosis not present

## 2019-12-26 DIAGNOSIS — S52612D Displaced fracture of left ulna styloid process, subsequent encounter for closed fracture with routine healing: Secondary | ICD-10-CM | POA: Diagnosis not present

## 2019-12-26 DIAGNOSIS — E785 Hyperlipidemia, unspecified: Secondary | ICD-10-CM | POA: Diagnosis not present

## 2019-12-26 DIAGNOSIS — S2242XD Multiple fractures of ribs, left side, subsequent encounter for fracture with routine healing: Secondary | ICD-10-CM | POA: Diagnosis not present

## 2019-12-26 DIAGNOSIS — Z7984 Long term (current) use of oral hypoglycemic drugs: Secondary | ICD-10-CM | POA: Diagnosis not present

## 2019-12-26 DIAGNOSIS — K219 Gastro-esophageal reflux disease without esophagitis: Secondary | ICD-10-CM | POA: Diagnosis not present

## 2019-12-26 DIAGNOSIS — G43909 Migraine, unspecified, not intractable, without status migrainosus: Secondary | ICD-10-CM | POA: Diagnosis not present

## 2019-12-26 DIAGNOSIS — Z8673 Personal history of transient ischemic attack (TIA), and cerebral infarction without residual deficits: Secondary | ICD-10-CM | POA: Diagnosis not present

## 2019-12-26 DIAGNOSIS — M1990 Unspecified osteoarthritis, unspecified site: Secondary | ICD-10-CM | POA: Diagnosis not present

## 2019-12-26 DIAGNOSIS — S92421D Displaced fracture of distal phalanx of right great toe, subsequent encounter for fracture with routine healing: Secondary | ICD-10-CM | POA: Diagnosis not present

## 2019-12-26 DIAGNOSIS — I119 Hypertensive heart disease without heart failure: Secondary | ICD-10-CM | POA: Diagnosis not present

## 2019-12-26 DIAGNOSIS — Z9181 History of falling: Secondary | ICD-10-CM | POA: Diagnosis not present

## 2019-12-26 DIAGNOSIS — I2510 Atherosclerotic heart disease of native coronary artery without angina pectoris: Secondary | ICD-10-CM | POA: Diagnosis not present

## 2019-12-26 DIAGNOSIS — Z7902 Long term (current) use of antithrombotics/antiplatelets: Secondary | ICD-10-CM | POA: Diagnosis not present

## 2019-12-27 NOTE — Discharge Summary (Signed)
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 Allergy   . Arthritis   . Atypical chest pain    a. Reportedly nl cath in the past; b. 12/2009 MV: No ischemia/infarct. EF 87%.  . Carotid arterial disease ([place])    a. 12/735 CTA head/neck: LICA 10G, RICA 26-->RSW rx.  . Cellulitis and abscess of face   . Chronic Dizziness   . Coronary artery calcification seen on CT scan    a. 10/2016 CTA chest: Atherosclerotic calcifications Ao, cors, and prox great vessels.  . Depression   . Diabetes mellitus ([place])   . Diastolic dysfunction    a. 2008 Echo: Nl EF; b. 2011 Echo: Nl EF; c. 06/2016 Echo: EF 55-60%, Gr1DD; d. 03/2018 Echo: EF 55-60%, no rwma, Gr1DD, mod dil LA.  . Edema   . GERD (gastroesophageal reflux disease)   . Headache   . Hematuria   . Hyperlipidemia   . Hypertension   . IBS (irritable bowel syndrome)   . Migraine   . Morbid obesity ([place])   . Nocturia   . Possible Seizure ([place])    a. 10/2016 ? sz. MRI neg for stroke.  . Stroke ([place]) 2000  . TIA (transient ischemic attack) 2010 & 2019   a. 2010; b. 03/2018 Left sided wkns-->MRI neg for stroke. CTA head/neck: Right A2 cut off/occlusion w/ collats extending over R cerebral convexity. LICA 54O, RICA 45, RSubclavian 50->med rx.  .  Urgency of micturation   . Urinary frequency   . Urinary incontinence    HOSPITAL COURSE:  Susan House is a 64 y.o. female with medical history significant of hypertension, hyperlipidemia, diabetes mellitus, stroke with left-sided weakness, TIA, GERD, hypothyroidism, depression, migraine headache, Seizure, CAD, carotid artery stenosis, CKD stage III, admitted for worsening left-sided weakness.  Left-sided weaknessand hx of stroke:Patient has a worsening left sided weakness. Etiology is not clear. CT head is negative. MRI of the brain did not show new stroke. Dr. 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  Continue home regimen for now  Hypoglycemia: Present on admission and transient in nature -Now resolved likely due to poor p.o. intake before admission  CKD (chronic kidney disease), stage IIIa:Stable. Baseline creatinine 1.3-1.5.  At baseline     DISCHARGE CONDITIONS:  Stable CONSULTS OBTAINED:  Treatment Team:  Catarina Hartshorn, MD DRUG ALLERGIES:   Allergies  Allergen Reactions  .  Morphine And Related Anaphylaxis  . Reglan [Metoclopramide]     Other reaction(s): Unknown Elevated BP  . Simvastatin Other (See Comments)    Muscle pain  . Betadine [Povidone Iodine] Rash  . Tetracyclines & Related Rash    Other reaction(s): Unknown   DISCHARGE MEDICATIONS:   Allergies as of 12/25/2019      Reactions   Morphine And Related Anaphylaxis   Reglan [metoclopramide]    Other reaction(s): Unknown Elevated BP   Simvastatin  Other (See Comments)   Muscle pain   Betadine [povidone Iodine] Rash   Tetracyclines & Related Rash   Other reaction(s): Unknown      Medication List    TAKE these medications   amLODipine 10 MG tablet Commonly known as: NORVASC TAKE 1 TABLET BY MOUTH  DAILY   aspirin 81 MG chewable tablet Chew 1 tablet (81 mg total) by mouth daily.   atorvastatin 20 MG tablet Commonly known as: LIPITOR TAKE 1 TABLET BY MOUTH  DAILY What changed: when to take this   clopidogrel 75 MG tablet Commonly known as: PLAVIX TAKE 1 TABLET BY MOUTH  DAILY   enalapril 20 MG tablet Commonly known as: VASOTEC Take 1 tablet (20 mg total) by mouth 2 (two) times daily.   gabapentin 100 MG capsule Commonly known as: NEURONTIN Take 200 mg by mouth 2 (two) times daily.   hydrALAZINE 100 MG tablet Commonly known as: APRESOLINE Take 1 tablet (100 mg total) by mouth 3 (three) times daily. What changed: when to take this   HYDROcodone-acetaminophen 5-325 MG tablet Commonly known as: Norco Take 2 tablets by mouth every 6 (six) hours as needed for moderate pain.   levETIRAcetam 500 MG tablet Commonly known as: KEPPRA TAKE 1 TABLET BY MOUTH 2  TIMES DAILY   metFORMIN 1000 MG tablet Commonly known as: GLUCOPHAGE TAKE 1 TABLET BY MOUTH TWO  TIMES DAILY   metoprolol succinate 100 MG 24 hr tablet Commonly known as: TOPROL-XL TAKE 1 TABLET BY MOUTH  DAILY   nitroGLYCERIN 0.4 MG SL tablet Commonly known as: NITROSTAT Place 1 tablet (0.4 mg total) under the tongue every 5 (five) minutes as needed for chest pain.   ondansetron 4 MG tablet Commonly known as: Zofran Take 1 tablet (4 mg total) by mouth every 8 (eight) hours as needed for nausea or vomiting.   pantoprazole 40 MG tablet Commonly known as: PROTONIX TAKE 1 TABLET BY MOUTH  DAILY   Tresiba FlexTouch 100 UNIT/ML FlexTouch Pen Generic drug: insulin degludec ADMINISTER UP TO 30 UNITS UNDER THE SKIN DAILY      DISCHARGE INSTRUCTIONS:    DIET:  Diabetic diet DISCHARGE CONDITION:  Stable ACTIVITY:  Activity as tolerated OXYGEN:  Home Oxygen: No.  Oxygen Delivery: room air DISCHARGE LOCATION:  home with home health PT, OT, speech and social work  If you experience worsening of your admission symptoms, develop shortness of breath, life threatening emergency, suicidal or homicidal thoughts you must seek medical attention immediately by calling 911 or calling your MD immediately  if symptoms less severe.  You Must read complete instructions/literature along with all the possible adverse reactions/side effects for all the Medicines you take and that have been prescribed to you. Take any new Medicines after you have completely understood and accpet all the possible adverse reactions/side effects.   Please note  You were cared for by a hospitalist during your hospital stay. If you have any questions about your discharge medications or the care you received while  you were in the hospital after you are discharged, you can call the unit and asked to speak with the hospitalist on call if the hospitalist that took care of you is not available. Once you are discharged, your primary care physician will handle any further medical issues. Please note that NO REFILLS for any discharge medications will be authorized once you are discharged, as it is imperative that you return to your primary care physician (or establish a relationship with a primary care physician if you do not have one) for your aftercare needs so that they can reassess your need for medications and monitor your lab values.    On the day of Discharge:  VITAL SIGNS:  Blood pressure (!) 166/62, pulse 91, temperature 98.2 F (36.8 C), temperature source Oral, resp. rate 15, height 5\' 1"  (1.549 m), weight 80 kg, SpO2 99 %. PHYSICAL EXAMINATION:  GENERAL:  64 y.o.-year-old patient lying in the bed with no acute distress.  EYES: Pupils equal, round, reactive to light and  accommodation. No scleral icterus. Extraocular muscles intact.  HEENT: Head atraumatic, normocephalic. Oropharynx and nasopharynx clear.  NECK:  Supple, no jugular venous distention. No thyroid enlargement, no tenderness.  LUNGS: Normal breath sounds bilaterally, no wheezing, rales,rhonchi or crepitation. No use of accessory muscles of respiration.  CARDIOVASCULAR: S1, S2 normal. No murmurs, rubs, or gallops.  ABDOMEN: Soft, non-tender, non-distended. Bowel sounds present. No organomegaly or mass.  EXTREMITIES: No pedal edema, cyanosis, or clubbing.  NEUROLOGIC: Cranial nerves II through XII are intact. Muscle strength 5/5 in all extremities. Sensation intact. Gait not checked.  PSYCHIATRIC: The patient is alert and oriented x 3.  SKIN: No obvious rash, lesion, or ulcer.  DATA REVIEW:   CBC Recent Labs  Lab 12/24/19 0513  WBC 6.6  HGB 11.1*  HCT 33.6*  PLT 243    Chemistries  Recent Labs  Lab 12/23/19 1201 12/23/19 1201 12/24/19 0513  NA 140   < > 139  K 4.9   < > 4.2  CL 111   < > 109  CO2 19*   < > 22  GLUCOSE 94   < > 238*  BUN 26*   < > 23  CREATININE 1.54*   < > 1.40*  CALCIUM 8.5*   < > 8.0*  AST 26  --   --   ALT 15  --   --   ALKPHOS 88  --   --   BILITOT 0.5  --   --    < > = values in this interval not displayed.     Outpatient follow-up  Follow-up Information    Go to Jerrol Banana., MD.   Specialty: [facility] Medicine Contact information: [address] [place] [address] [phone]        Minna Merritts, MD.   Specialty: Cardiology Contact information: [place] [address] [phone]        Vladimir Crofts, MD. Go on 01/21/2020.   Specialty: Neurology Why: at 8:15 am Contact information: [place] [facility] [place] [address] [phone]                Management plans discussed with the patient, family and they are in  agreement.  CODE STATUS: Prior   TOTAL TIME TAKING CARE OF THIS PATIENT: 45 minutes.    Max Sane M.D on 12/27/2019 at 12:40 PM  [facility]   CC: Primary care physician;  Jerrol Banana., MD   Note: This dictation was prepared with Dragon dictation along with smaller phrase technology. Any transcriptional errors that result from this process are unintentional.

## 2019-12-30 ENCOUNTER — Telehealth: Payer: Self-pay | Admitting: Family Medicine

## 2019-12-30 DIAGNOSIS — I2510 Atherosclerotic heart disease of native coronary artery without angina pectoris: Secondary | ICD-10-CM | POA: Diagnosis not present

## 2019-12-30 DIAGNOSIS — Z7902 Long term (current) use of antithrombotics/antiplatelets: Secondary | ICD-10-CM | POA: Diagnosis not present

## 2019-12-30 DIAGNOSIS — Z7984 Long term (current) use of oral hypoglycemic drugs: Secondary | ICD-10-CM | POA: Diagnosis not present

## 2019-12-30 DIAGNOSIS — S52612D Displaced fracture of left ulna styloid process, subsequent encounter for closed fracture with routine healing: Secondary | ICD-10-CM | POA: Diagnosis not present

## 2019-12-30 DIAGNOSIS — S2242XD Multiple fractures of ribs, left side, subsequent encounter for fracture with routine healing: Secondary | ICD-10-CM | POA: Diagnosis not present

## 2019-12-30 DIAGNOSIS — E785 Hyperlipidemia, unspecified: Secondary | ICD-10-CM | POA: Diagnosis not present

## 2019-12-30 DIAGNOSIS — Z9181 History of falling: Secondary | ICD-10-CM | POA: Diagnosis not present

## 2019-12-30 DIAGNOSIS — G43909 Migraine, unspecified, not intractable, without status migrainosus: Secondary | ICD-10-CM | POA: Diagnosis not present

## 2019-12-30 DIAGNOSIS — E1142 Type 2 diabetes mellitus with diabetic polyneuropathy: Secondary | ICD-10-CM | POA: Diagnosis not present

## 2019-12-30 DIAGNOSIS — K219 Gastro-esophageal reflux disease without esophagitis: Secondary | ICD-10-CM | POA: Diagnosis not present

## 2019-12-30 DIAGNOSIS — M1990 Unspecified osteoarthritis, unspecified site: Secondary | ICD-10-CM | POA: Diagnosis not present

## 2019-12-30 DIAGNOSIS — S92421D Displaced fracture of distal phalanx of right great toe, subsequent encounter for fracture with routine healing: Secondary | ICD-10-CM | POA: Diagnosis not present

## 2019-12-30 DIAGNOSIS — I119 Hypertensive heart disease without heart failure: Secondary | ICD-10-CM | POA: Diagnosis not present

## 2019-12-30 DIAGNOSIS — E1151 Type 2 diabetes mellitus with diabetic peripheral angiopathy without gangrene: Secondary | ICD-10-CM | POA: Diagnosis not present

## 2019-12-30 DIAGNOSIS — Z8673 Personal history of transient ischemic attack (TIA), and cerebral infarction without residual deficits: Secondary | ICD-10-CM | POA: Diagnosis not present

## 2019-12-30 NOTE — Telephone Encounter (Signed)
Verbal order given  

## 2019-12-30 NOTE — Progress Notes (Deleted)
     Established patient visit   Patient: Susan House   DOB: 1955/11/05   64 y.o. Female  MRN: [id number] Visit Date: 01/01/2020  Today's healthcare provider: Wilhemena Durie, MD   No chief complaint on file.  Subjective    HPI Follow up Hospitalization  Patient was admitted to [facility] on 12/23/2019 and discharged on 12/25/2019. She was treated for Weakness, Left-sided weakness. Treatment for this included; see notes in chart. Telephone follow up was done on *** She reports {excellent/good/fair:19992} compliance with treatment. She reports this condition is {resolved/improved/worsened:23923}.  ----------------------------------------------------------------------------------------- -    {Show patient history (optional):23778::" "}   Medications: Outpatient Medications Prior to Visit  Medication Sig  . amLODipine (NORVASC) 10 MG tablet TAKE 1 TABLET BY MOUTH  DAILY  . aspirin 81 MG chewable tablet Chew 1 tablet (81 mg total) by mouth daily.  Marland Kitchen atorvastatin (LIPITOR) 20 MG tablet TAKE 1 TABLET BY MOUTH  DAILY (Patient taking differently: Take 20 mg by mouth at bedtime. )  . clopidogrel (PLAVIX) 75 MG tablet TAKE 1 TABLET BY MOUTH  DAILY  . enalapril (VASOTEC) 20 MG tablet Take 1 tablet (20 mg total) by mouth 2 (two) times daily.  Marland Kitchen gabapentin (NEURONTIN) 100 MG capsule Take 200 mg by mouth 2 (two) times daily.   . hydrALAZINE (APRESOLINE) 100 MG tablet Take 1 tablet (100 mg total) by mouth 3 (three) times daily.  Marland Kitchen HYDROcodone-acetaminophen (NORCO) 5-325 MG tablet Take 2 tablets by mouth every 6 (six) hours as needed for moderate pain.  Marland Kitchen insulin degludec (TRESIBA FLEXTOUCH) 100 UNIT/ML SOPN FlexTouch Pen ADMINISTER UP TO 30 UNITS UNDER THE SKIN DAILY  . levETIRAcetam (KEPPRA) 500 MG tablet TAKE 1 TABLET BY MOUTH 2  TIMES DAILY  . metFORMIN (GLUCOPHAGE) 1000 MG tablet TAKE 1 TABLET BY MOUTH TWO  TIMES DAILY  . metoprolol succinate (TOPROL-XL) 100 MG 24 hr tablet TAKE 1  TABLET BY MOUTH  DAILY  . nitroGLYCERIN (NITROSTAT) 0.4 MG SL tablet Place 1 tablet (0.4 mg total) under the tongue every 5 (five) minutes as needed for chest pain.  Marland Kitchen ondansetron (ZOFRAN) 4 MG tablet Take 1 tablet (4 mg total) by mouth every 8 (eight) hours as needed for nausea or vomiting.  . pantoprazole (PROTONIX) 40 MG tablet TAKE 1 TABLET BY MOUTH  DAILY   No facility-administered medications prior to visit.    Review of Systems  Constitutional: Negative for appetite change, chills, fatigue and fever.  Respiratory: Negative for chest tightness and shortness of breath.   Cardiovascular: Negative for chest pain and palpitations.  Gastrointestinal: Negative for abdominal pain, nausea and vomiting.  Neurological: Negative for dizziness and weakness.    {Heme  Chem  Endocrine  Serology  Results Review (optional):23779::" "}  Objective    There were no vitals taken for this visit. {Show previous vital signs (optional):23777::" "}  Physical Exam  ***  No results found for any visits on 01/01/20.  Assessment & Plan     ***  No follow-ups on file.      {provider attestation***:1}   Wilhemena Durie, MD  [facility] [phone] (phone) [phone] (fax)  [place]

## 2019-12-30 NOTE — Telephone Encounter (Signed)
Okay for these orders.

## 2019-12-30 NOTE — Telephone Encounter (Signed)
[place] from [place] calling re: Nursing e-val for a potential wound on pt bottom and also social work. Pls send verbal to [phone] or if prefer fax to [phone]

## 2019-12-31 ENCOUNTER — Encounter (INDEPENDENT_AMBULATORY_CARE_PROVIDER_SITE_OTHER): Payer: Medicare Other

## 2019-12-31 ENCOUNTER — Telehealth: Payer: Medicare Other | Admitting: Cardiovascular Disease

## 2019-12-31 ENCOUNTER — Ambulatory Visit (INDEPENDENT_AMBULATORY_CARE_PROVIDER_SITE_OTHER): Payer: Medicare Other | Admitting: Nurse Practitioner

## 2019-12-31 ENCOUNTER — Telehealth: Payer: Self-pay | Admitting: Family Medicine

## 2019-12-31 DIAGNOSIS — S92421D Displaced fracture of distal phalanx of right great toe, subsequent encounter for fracture with routine healing: Secondary | ICD-10-CM | POA: Diagnosis not present

## 2019-12-31 DIAGNOSIS — E1142 Type 2 diabetes mellitus with diabetic polyneuropathy: Secondary | ICD-10-CM | POA: Diagnosis not present

## 2019-12-31 DIAGNOSIS — G43909 Migraine, unspecified, not intractable, without status migrainosus: Secondary | ICD-10-CM | POA: Diagnosis not present

## 2019-12-31 DIAGNOSIS — S2242XD Multiple fractures of ribs, left side, subsequent encounter for fracture with routine healing: Secondary | ICD-10-CM | POA: Diagnosis not present

## 2019-12-31 DIAGNOSIS — I119 Hypertensive heart disease without heart failure: Secondary | ICD-10-CM | POA: Diagnosis not present

## 2019-12-31 DIAGNOSIS — S52612D Displaced fracture of left ulna styloid process, subsequent encounter for closed fracture with routine healing: Secondary | ICD-10-CM | POA: Diagnosis not present

## 2019-12-31 DIAGNOSIS — M1990 Unspecified osteoarthritis, unspecified site: Secondary | ICD-10-CM | POA: Diagnosis not present

## 2019-12-31 DIAGNOSIS — Z8673 Personal history of transient ischemic attack (TIA), and cerebral infarction without residual deficits: Secondary | ICD-10-CM | POA: Diagnosis not present

## 2019-12-31 DIAGNOSIS — K219 Gastro-esophageal reflux disease without esophagitis: Secondary | ICD-10-CM | POA: Diagnosis not present

## 2019-12-31 DIAGNOSIS — Z9181 History of falling: Secondary | ICD-10-CM | POA: Diagnosis not present

## 2019-12-31 DIAGNOSIS — I2510 Atherosclerotic heart disease of native coronary artery without angina pectoris: Secondary | ICD-10-CM | POA: Diagnosis not present

## 2019-12-31 DIAGNOSIS — E785 Hyperlipidemia, unspecified: Secondary | ICD-10-CM | POA: Diagnosis not present

## 2019-12-31 DIAGNOSIS — Z7984 Long term (current) use of oral hypoglycemic drugs: Secondary | ICD-10-CM | POA: Diagnosis not present

## 2019-12-31 DIAGNOSIS — E1151 Type 2 diabetes mellitus with diabetic peripheral angiopathy without gangrene: Secondary | ICD-10-CM | POA: Diagnosis not present

## 2019-12-31 DIAGNOSIS — Z7902 Long term (current) use of antithrombotics/antiplatelets: Secondary | ICD-10-CM | POA: Diagnosis not present

## 2019-12-31 NOTE — Telephone Encounter (Signed)
Jessica advised.

## 2019-12-31 NOTE — Telephone Encounter (Signed)
Janett Billow nurse with [facility] went and accessed pt today. She has New stage 2 pressure wound on right inner buttock.  would like verbal nursing orders to come out and care for the wound. Foam dressing.if ok with the dr, -if it is ok, she can get that going asap. Janett Billow would recommend  2 wk 4  for dressing and teaching the husband to change on days they are not there.  Pt advised her she has appt tomorrow.  Janett Billow would like the dr to look at it please. cb [phone]

## 2019-12-31 NOTE — Telephone Encounter (Signed)
Ok to order 

## 2020-01-01 ENCOUNTER — Ambulatory Visit: Payer: Medicare Other | Admitting: Family Medicine

## 2020-01-06 ENCOUNTER — Telehealth: Payer: Self-pay | Admitting: Family Medicine

## 2020-01-06 ENCOUNTER — Telehealth: Payer: Self-pay

## 2020-01-06 DIAGNOSIS — M1990 Unspecified osteoarthritis, unspecified site: Secondary | ICD-10-CM | POA: Diagnosis not present

## 2020-01-06 DIAGNOSIS — K219 Gastro-esophageal reflux disease without esophagitis: Secondary | ICD-10-CM | POA: Diagnosis not present

## 2020-01-06 DIAGNOSIS — E1142 Type 2 diabetes mellitus with diabetic polyneuropathy: Secondary | ICD-10-CM | POA: Diagnosis not present

## 2020-01-06 DIAGNOSIS — G43909 Migraine, unspecified, not intractable, without status migrainosus: Secondary | ICD-10-CM | POA: Diagnosis not present

## 2020-01-06 DIAGNOSIS — Z7984 Long term (current) use of oral hypoglycemic drugs: Secondary | ICD-10-CM | POA: Diagnosis not present

## 2020-01-06 DIAGNOSIS — Z9181 History of falling: Secondary | ICD-10-CM | POA: Diagnosis not present

## 2020-01-06 DIAGNOSIS — Z8673 Personal history of transient ischemic attack (TIA), and cerebral infarction without residual deficits: Secondary | ICD-10-CM | POA: Diagnosis not present

## 2020-01-06 DIAGNOSIS — N3090 Cystitis, unspecified without hematuria: Secondary | ICD-10-CM

## 2020-01-06 DIAGNOSIS — E1151 Type 2 diabetes mellitus with diabetic peripheral angiopathy without gangrene: Secondary | ICD-10-CM | POA: Diagnosis not present

## 2020-01-06 DIAGNOSIS — Z7902 Long term (current) use of antithrombotics/antiplatelets: Secondary | ICD-10-CM | POA: Diagnosis not present

## 2020-01-06 DIAGNOSIS — I119 Hypertensive heart disease without heart failure: Secondary | ICD-10-CM | POA: Diagnosis not present

## 2020-01-06 DIAGNOSIS — S92421D Displaced fracture of distal phalanx of right great toe, subsequent encounter for fracture with routine healing: Secondary | ICD-10-CM | POA: Diagnosis not present

## 2020-01-06 DIAGNOSIS — I2510 Atherosclerotic heart disease of native coronary artery without angina pectoris: Secondary | ICD-10-CM | POA: Diagnosis not present

## 2020-01-06 DIAGNOSIS — E785 Hyperlipidemia, unspecified: Secondary | ICD-10-CM | POA: Diagnosis not present

## 2020-01-06 DIAGNOSIS — S2242XD Multiple fractures of ribs, left side, subsequent encounter for fracture with routine healing: Secondary | ICD-10-CM | POA: Diagnosis not present

## 2020-01-06 DIAGNOSIS — S52612D Displaced fracture of left ulna styloid process, subsequent encounter for closed fracture with routine healing: Secondary | ICD-10-CM | POA: Diagnosis not present

## 2020-01-06 NOTE — Telephone Encounter (Signed)
Please advise 

## 2020-01-06 NOTE — Telephone Encounter (Signed)
Pt called and is requesting to have something sent in for her bladder infection. Pt was offered an appt and declined due to " not feeling well". Please advise.     [id number] Susan House, [address]  [place] [address]  Phone: [phone] Fax: [phone]  Hours: Not open 24 hours

## 2020-01-06 NOTE — Telephone Encounter (Signed)
FYI

## 2020-01-06 NOTE — Telephone Encounter (Signed)
Cipro 250 mg twice a day for 3 days.

## 2020-01-06 NOTE — Telephone Encounter (Signed)
Copied from [place] [phone]. Topic: General - Other >> Jan 05, 2020  5:07 PM Yvette Rack wrote: Reason for CRM: Evonnie Dawes with [facility] called to report that patient missed a visit on 01/02/20. Cb# [phone]

## 2020-01-07 MED ORDER — CIPROFLOXACIN HCL 250 MG PO TABS: 250.0000 mg | ORAL_TABLET | Freq: Two times a day (BID) | ORAL | 0 refills | Status: DC

## 2020-01-07 NOTE — Telephone Encounter (Signed)
Patient advised that medication has been sent to the pharmacy for her.

## 2020-01-08 DIAGNOSIS — Z7984 Long term (current) use of oral hypoglycemic drugs: Secondary | ICD-10-CM | POA: Diagnosis not present

## 2020-01-08 DIAGNOSIS — E1151 Type 2 diabetes mellitus with diabetic peripheral angiopathy without gangrene: Secondary | ICD-10-CM | POA: Diagnosis not present

## 2020-01-08 DIAGNOSIS — E785 Hyperlipidemia, unspecified: Secondary | ICD-10-CM | POA: Diagnosis not present

## 2020-01-08 DIAGNOSIS — K219 Gastro-esophageal reflux disease without esophagitis: Secondary | ICD-10-CM | POA: Diagnosis not present

## 2020-01-08 DIAGNOSIS — Z7902 Long term (current) use of antithrombotics/antiplatelets: Secondary | ICD-10-CM | POA: Diagnosis not present

## 2020-01-08 DIAGNOSIS — G43909 Migraine, unspecified, not intractable, without status migrainosus: Secondary | ICD-10-CM | POA: Diagnosis not present

## 2020-01-08 DIAGNOSIS — E1142 Type 2 diabetes mellitus with diabetic polyneuropathy: Secondary | ICD-10-CM | POA: Diagnosis not present

## 2020-01-08 DIAGNOSIS — S92421D Displaced fracture of distal phalanx of right great toe, subsequent encounter for fracture with routine healing: Secondary | ICD-10-CM | POA: Diagnosis not present

## 2020-01-08 DIAGNOSIS — S52612D Displaced fracture of left ulna styloid process, subsequent encounter for closed fracture with routine healing: Secondary | ICD-10-CM | POA: Diagnosis not present

## 2020-01-08 DIAGNOSIS — S2242XD Multiple fractures of ribs, left side, subsequent encounter for fracture with routine healing: Secondary | ICD-10-CM | POA: Diagnosis not present

## 2020-01-08 DIAGNOSIS — Z8673 Personal history of transient ischemic attack (TIA), and cerebral infarction without residual deficits: Secondary | ICD-10-CM | POA: Diagnosis not present

## 2020-01-08 DIAGNOSIS — I2510 Atherosclerotic heart disease of native coronary artery without angina pectoris: Secondary | ICD-10-CM | POA: Diagnosis not present

## 2020-01-08 DIAGNOSIS — Z9181 History of falling: Secondary | ICD-10-CM | POA: Diagnosis not present

## 2020-01-08 DIAGNOSIS — M1990 Unspecified osteoarthritis, unspecified site: Secondary | ICD-10-CM | POA: Diagnosis not present

## 2020-01-08 DIAGNOSIS — I119 Hypertensive heart disease without heart failure: Secondary | ICD-10-CM | POA: Diagnosis not present

## 2020-01-12 NOTE — Progress Notes (Signed)
Trena Platt Cummings,acting as a scribe for Wilhemena Durie, MD.,have documented all relevant documentation on the behalf of Wilhemena Durie, MD,as directed by  Wilhemena Durie, MD while in the presence of Wilhemena Durie, MD. Established patient visit   Patient: Susan House   DOB: 1955-08-11   64 y.o. Female  MRN: [id number] Visit Date: 01/13/2020  Today's healthcare provider: Wilhemena Durie, MD   Chief Complaint  Patient presents with  . Hospitalization Follow-up   Subjective    HPI Follow up Hospitalization No definitive diagnosis made.General weakness. Patient was admitted to [facility] on 12/23/2019 and discharged on 12/25/2019. She was treated for Left-Sided weakness. Treatment for this included; see notes in chart. Telephone follow up was done on none She reports fair compliance with treatment. She reports this condition is worsened.  ----------------------------------------------------------------------------------------- -  Patient's daughter says that is not walking much and that she is having spells where she does not talk. Patient is also coughing that started over the weekend.  She brings in a full page list of concerns most of which having to do with patient's inability to move much.  Also some possible cognitive challenges.  Unclear whether she is taking her medications or not.  She is unable to get in and out of tub for up to bathe.  She walks very little, she denies depression being any worse. The other new issue is 1 of mild cough that is occasionally productive with thick postnasal drainage with sinus headache. Social History   Tobacco Use  . Smoking status: Never Smoker  . Smokeless tobacco: Never Used  Vaping Use  . Vaping Use: Never used  Substance Use Topics  . Alcohol use: No  . Drug use: No       Medications: Outpatient Medications Prior to Visit  Medication Sig  . amLODipine (NORVASC) 10 MG tablet TAKE 1 TABLET BY MOUTH   DAILY  . aspirin 81 MG chewable tablet Chew 1 tablet (81 mg total) by mouth daily.  Marland Kitchen atorvastatin (LIPITOR) 20 MG tablet TAKE 1 TABLET BY MOUTH  DAILY (Patient taking differently: Take 20 mg by mouth at bedtime. )  . clopidogrel (PLAVIX) 75 MG tablet TAKE 1 TABLET BY MOUTH  DAILY  . enalapril (VASOTEC) 20 MG tablet Take 1 tablet (20 mg total) by mouth 2 (two) times daily.  Marland Kitchen gabapentin (NEURONTIN) 100 MG capsule Take 200 mg by mouth 2 (two) times daily.   . hydrALAZINE (APRESOLINE) 100 MG tablet Take 1 tablet (100 mg total) by mouth 3 (three) times daily.  Marland Kitchen HYDROcodone-acetaminophen (NORCO) 5-325 MG tablet Take 2 tablets by mouth every 6 (six) hours as needed for moderate pain.  Marland Kitchen insulin degludec (TRESIBA FLEXTOUCH) 100 UNIT/ML SOPN FlexTouch Pen ADMINISTER UP TO 30 UNITS UNDER THE SKIN DAILY  . levETIRAcetam (KEPPRA) 500 MG tablet TAKE 1 TABLET BY MOUTH 2  TIMES DAILY  . metFORMIN (GLUCOPHAGE) 1000 MG tablet TAKE 1 TABLET BY MOUTH TWO  TIMES DAILY  . metoprolol succinate (TOPROL-XL) 100 MG 24 hr tablet TAKE 1 TABLET BY MOUTH  DAILY  . nitroGLYCERIN (NITROSTAT) 0.4 MG SL tablet Place 1 tablet (0.4 mg total) under the tongue every 5 (five) minutes as needed for chest pain.  Marland Kitchen ondansetron (ZOFRAN) 4 MG tablet Take 1 tablet (4 mg total) by mouth every 8 (eight) hours as needed for nausea or vomiting.  . pantoprazole (PROTONIX) 40 MG tablet TAKE 1 TABLET BY MOUTH  DAILY  .  ciprofloxacin (CIPRO) 250 MG tablet Take 1 tablet (250 mg total) by mouth 2 (two) times daily. (Patient not taking: Reported on 01/13/2020)   No facility-administered medications prior to visit.    Review of Systems  Constitutional: Negative for appetite change, chills, fatigue and fever.  HENT: Negative.   Eyes: Negative.   Respiratory: Negative for chest tightness and shortness of breath.   Cardiovascular: Negative for chest pain and palpitations.  Gastrointestinal: Negative for abdominal pain, nausea and vomiting.    Endocrine: Negative.   Genitourinary: Negative.   Musculoskeletal: Positive for arthralgias and back pain.  Allergic/Immunologic: Negative.   Neurological: Positive for weakness. Negative for dizziness.       Chronic left-sided weakness from old CVA  Psychiatric/Behavioral: Negative.        Objective    BP 125/70 (BP Location: Right Arm, Patient Position: Sitting, Cuff Size: Normal)   Pulse 73   Temp (!) 96.9 F (36.1 C) (Temporal)  BP Readings from Last 3 Encounters:  01/13/20 125/70  12/25/19 (!) 166/62  11/18/19 (!) 146/70   Wt Readings from Last 3 Encounters:  12/25/19 176 lb 5.9 oz (80 kg)  11/10/19 229 lb 11.5 oz (104.2 kg)  07/19/19 231 lb 7.7 oz (105 kg)      Physical Exam Vitals reviewed.  Constitutional:      General: She is not in acute distress.    Appearance: She is obese.  HENT:     Head: Normocephalic and atraumatic.     Right Ear: External ear normal.     Left Ear: External ear normal.  Eyes:     General: No scleral icterus.    Conjunctiva/sclera: Conjunctivae normal.  Cardiovascular:     Rate and Rhythm: Normal rate and regular rhythm.     Pulses: Normal pulses.     Heart sounds: Normal heart sounds.  Pulmonary:     Effort: Pulmonary effort is normal.     Breath sounds: Normal breath sounds.  Abdominal:     Palpations: Abdomen is soft.  Skin:    General: Skin is warm and dry.  Neurological:     Mental Status: She is alert and oriented to person, place, and time. Mental status is at baseline.     Comments: Chronic left hemiparesis.  Patient in a wheelchair.  Psychiatric:        Mood and Affect: Mood normal.        Behavior: Behavior normal.        Thought Content: Thought content normal.        Judgment: Judgment normal.       No results found for any visits on 01/13/20.  Assessment & Plan     1. Sinusitis, unspecified chronicity, unspecified location May need ENT referral but I think this is a minor issue -  amoxicillin-clavulanate (AUGMENTIN) 875-125 MG tablet; Take 1 tablet by mouth 2 (two) times daily.  Dispense: 20 tablet; Refill: 0  2. 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) Encourage patient to participate with with physical therapy thoroughly.  I am worried that she is heading towards assisted living or nursing home if she does not improve.  3. Cerebrovascular accident (CVA) due to occlusion of left middle cerebral artery ([place])   4. Type 2 diabetes mellitus with stage 3a chronic kidney disease, with long-term current use of insulin (HCC) On insulin.  5. Spastic hemiplegia of left nondominant side as late effect of cerebral infarction (HCC)   6. Closed fracture of  multiple ribs of left side with routine healing, subsequent encounter Use physical therapy to try to lower the risk of falls.  7. Restless legs syndrome (RLS)   8. Poor mobility   9. Class 3 severe obesity due to excess calories with serious comorbidity and body mass index (BMI) of 40.0 to 44.9 in adult [facility]) Weight loss will certainly help her to be able to move more easily  10. 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 MCI (mild cognitive impairment) MMSE on next visit.   No follow-ups on file.         Julianah Marciel Cranford Mon, MD  [facility] [phone] (phone) [phone] (fax)  [place]

## 2020-01-13 ENCOUNTER — Other Ambulatory Visit: Payer: Self-pay

## 2020-01-13 ENCOUNTER — Ambulatory Visit (INDEPENDENT_AMBULATORY_CARE_PROVIDER_SITE_OTHER): Payer: Medicare Other | Admitting: Family Medicine

## 2020-01-13 ENCOUNTER — Encounter: Payer: Self-pay | Admitting: Family Medicine

## 2020-01-13 VITALS — BP 125/70 | HR 73 | Temp 96.9°F

## 2020-01-13 DIAGNOSIS — Z7409 Other reduced mobility: Secondary | ICD-10-CM

## 2020-01-13 DIAGNOSIS — R269 Unspecified abnormalities of gait and mobility: Secondary | ICD-10-CM

## 2020-01-13 DIAGNOSIS — I63512 Cerebral infarction due to unspecified occlusion or stenosis of left middle cerebral artery: Secondary | ICD-10-CM | POA: Diagnosis not present

## 2020-01-13 DIAGNOSIS — S52612D Displaced fracture of left ulna styloid process, subsequent encounter for closed fracture with routine healing: Secondary | ICD-10-CM | POA: Diagnosis not present

## 2020-01-13 DIAGNOSIS — I69398 Other sequelae of cerebral infarction: Secondary | ICD-10-CM | POA: Diagnosis not present

## 2020-01-13 DIAGNOSIS — M1990 Unspecified osteoarthritis, unspecified site: Secondary | ICD-10-CM | POA: Diagnosis not present

## 2020-01-13 DIAGNOSIS — Z6841 Body Mass Index (BMI) 40.0 and over, adult: Secondary | ICD-10-CM

## 2020-01-13 DIAGNOSIS — G3184 Mild cognitive impairment, so stated: Secondary | ICD-10-CM

## 2020-01-13 DIAGNOSIS — Z8673 Personal history of transient ischemic attack (TIA), and cerebral infarction without residual deficits: Secondary | ICD-10-CM | POA: Diagnosis not present

## 2020-01-13 DIAGNOSIS — K219 Gastro-esophageal reflux disease without esophagitis: Secondary | ICD-10-CM | POA: Diagnosis not present

## 2020-01-13 DIAGNOSIS — S2242XD Multiple fractures of ribs, left side, subsequent encounter for fracture with routine healing: Secondary | ICD-10-CM | POA: Diagnosis not present

## 2020-01-13 DIAGNOSIS — Z7984 Long term (current) use of oral hypoglycemic drugs: Secondary | ICD-10-CM | POA: Diagnosis not present

## 2020-01-13 DIAGNOSIS — F320 Major depressive disorder, single episode, mild: Secondary | ICD-10-CM

## 2020-01-13 DIAGNOSIS — S92421D Displaced fracture of distal phalanx of right great toe, subsequent encounter for fracture with routine healing: Secondary | ICD-10-CM | POA: Diagnosis not present

## 2020-01-13 DIAGNOSIS — J329 Chronic sinusitis, unspecified: Secondary | ICD-10-CM | POA: Diagnosis not present

## 2020-01-13 DIAGNOSIS — Z794 Long term (current) use of insulin: Secondary | ICD-10-CM

## 2020-01-13 DIAGNOSIS — Z7902 Long term (current) use of antithrombotics/antiplatelets: Secondary | ICD-10-CM | POA: Diagnosis not present

## 2020-01-13 DIAGNOSIS — I69354 Hemiplegia and hemiparesis following cerebral infarction affecting left non-dominant side: Secondary | ICD-10-CM | POA: Diagnosis not present

## 2020-01-13 DIAGNOSIS — G43909 Migraine, unspecified, not intractable, without status migrainosus: Secondary | ICD-10-CM | POA: Diagnosis not present

## 2020-01-13 DIAGNOSIS — I2510 Atherosclerotic heart disease of native coronary artery without angina pectoris: Secondary | ICD-10-CM | POA: Diagnosis not present

## 2020-01-13 DIAGNOSIS — N1831 Chronic kidney disease, stage 3a: Secondary | ICD-10-CM

## 2020-01-13 DIAGNOSIS — E1121 Type 2 diabetes mellitus with diabetic nephropathy: Secondary | ICD-10-CM

## 2020-01-13 DIAGNOSIS — E1151 Type 2 diabetes mellitus with diabetic peripheral angiopathy without gangrene: Secondary | ICD-10-CM | POA: Diagnosis not present

## 2020-01-13 DIAGNOSIS — I119 Hypertensive heart disease without heart failure: Secondary | ICD-10-CM | POA: Diagnosis not present

## 2020-01-13 DIAGNOSIS — Z9181 History of falling: Secondary | ICD-10-CM | POA: Diagnosis not present

## 2020-01-13 DIAGNOSIS — E1142 Type 2 diabetes mellitus with diabetic polyneuropathy: Secondary | ICD-10-CM | POA: Diagnosis not present

## 2020-01-13 DIAGNOSIS — E785 Hyperlipidemia, unspecified: Secondary | ICD-10-CM | POA: Diagnosis not present

## 2020-01-13 DIAGNOSIS — G2581 Restless legs syndrome: Secondary | ICD-10-CM

## 2020-01-13 DIAGNOSIS — E1122 Type 2 diabetes mellitus with diabetic chronic kidney disease: Secondary | ICD-10-CM

## 2020-01-13 MED ORDER — AMOXICILLIN-POT CLAVULANATE 875-125 MG PO TABS: 1.0000 | ORAL_TABLET | Freq: Two times a day (BID) | ORAL | 0 refills | Status: DC

## 2020-01-14 DIAGNOSIS — Z7902 Long term (current) use of antithrombotics/antiplatelets: Secondary | ICD-10-CM | POA: Diagnosis not present

## 2020-01-14 DIAGNOSIS — I2510 Atherosclerotic heart disease of native coronary artery without angina pectoris: Secondary | ICD-10-CM | POA: Diagnosis not present

## 2020-01-14 DIAGNOSIS — S52612D Displaced fracture of left ulna styloid process, subsequent encounter for closed fracture with routine healing: Secondary | ICD-10-CM | POA: Diagnosis not present

## 2020-01-14 DIAGNOSIS — Z7984 Long term (current) use of oral hypoglycemic drugs: Secondary | ICD-10-CM | POA: Diagnosis not present

## 2020-01-14 DIAGNOSIS — S2242XD Multiple fractures of ribs, left side, subsequent encounter for fracture with routine healing: Secondary | ICD-10-CM | POA: Diagnosis not present

## 2020-01-14 DIAGNOSIS — E785 Hyperlipidemia, unspecified: Secondary | ICD-10-CM | POA: Diagnosis not present

## 2020-01-14 DIAGNOSIS — G43909 Migraine, unspecified, not intractable, without status migrainosus: Secondary | ICD-10-CM | POA: Diagnosis not present

## 2020-01-14 DIAGNOSIS — M1990 Unspecified osteoarthritis, unspecified site: Secondary | ICD-10-CM | POA: Diagnosis not present

## 2020-01-14 DIAGNOSIS — Z8673 Personal history of transient ischemic attack (TIA), and cerebral infarction without residual deficits: Secondary | ICD-10-CM | POA: Diagnosis not present

## 2020-01-14 DIAGNOSIS — S92421D Displaced fracture of distal phalanx of right great toe, subsequent encounter for fracture with routine healing: Secondary | ICD-10-CM | POA: Diagnosis not present

## 2020-01-14 DIAGNOSIS — E1151 Type 2 diabetes mellitus with diabetic peripheral angiopathy without gangrene: Secondary | ICD-10-CM | POA: Diagnosis not present

## 2020-01-14 DIAGNOSIS — E1142 Type 2 diabetes mellitus with diabetic polyneuropathy: Secondary | ICD-10-CM | POA: Diagnosis not present

## 2020-01-14 DIAGNOSIS — Z9181 History of falling: Secondary | ICD-10-CM | POA: Diagnosis not present

## 2020-01-14 DIAGNOSIS — I119 Hypertensive heart disease without heart failure: Secondary | ICD-10-CM | POA: Diagnosis not present

## 2020-01-14 DIAGNOSIS — K219 Gastro-esophageal reflux disease without esophagitis: Secondary | ICD-10-CM | POA: Diagnosis not present

## 2020-01-16 DIAGNOSIS — Z8673 Personal history of transient ischemic attack (TIA), and cerebral infarction without residual deficits: Secondary | ICD-10-CM | POA: Diagnosis not present

## 2020-01-16 DIAGNOSIS — M1990 Unspecified osteoarthritis, unspecified site: Secondary | ICD-10-CM | POA: Diagnosis not present

## 2020-01-16 DIAGNOSIS — E785 Hyperlipidemia, unspecified: Secondary | ICD-10-CM | POA: Diagnosis not present

## 2020-01-16 DIAGNOSIS — Z7902 Long term (current) use of antithrombotics/antiplatelets: Secondary | ICD-10-CM | POA: Diagnosis not present

## 2020-01-16 DIAGNOSIS — S52612D Displaced fracture of left ulna styloid process, subsequent encounter for closed fracture with routine healing: Secondary | ICD-10-CM | POA: Diagnosis not present

## 2020-01-16 DIAGNOSIS — S2242XD Multiple fractures of ribs, left side, subsequent encounter for fracture with routine healing: Secondary | ICD-10-CM | POA: Diagnosis not present

## 2020-01-16 DIAGNOSIS — K219 Gastro-esophageal reflux disease without esophagitis: Secondary | ICD-10-CM | POA: Diagnosis not present

## 2020-01-16 DIAGNOSIS — S92421D Displaced fracture of distal phalanx of right great toe, subsequent encounter for fracture with routine healing: Secondary | ICD-10-CM | POA: Diagnosis not present

## 2020-01-16 DIAGNOSIS — Z7984 Long term (current) use of oral hypoglycemic drugs: Secondary | ICD-10-CM | POA: Diagnosis not present

## 2020-01-16 DIAGNOSIS — E1151 Type 2 diabetes mellitus with diabetic peripheral angiopathy without gangrene: Secondary | ICD-10-CM | POA: Diagnosis not present

## 2020-01-16 DIAGNOSIS — I2510 Atherosclerotic heart disease of native coronary artery without angina pectoris: Secondary | ICD-10-CM | POA: Diagnosis not present

## 2020-01-16 DIAGNOSIS — E1142 Type 2 diabetes mellitus with diabetic polyneuropathy: Secondary | ICD-10-CM | POA: Diagnosis not present

## 2020-01-16 DIAGNOSIS — I119 Hypertensive heart disease without heart failure: Secondary | ICD-10-CM | POA: Diagnosis not present

## 2020-01-16 DIAGNOSIS — Z9181 History of falling: Secondary | ICD-10-CM | POA: Diagnosis not present

## 2020-01-16 DIAGNOSIS — G43909 Migraine, unspecified, not intractable, without status migrainosus: Secondary | ICD-10-CM | POA: Diagnosis not present

## 2020-01-21 DIAGNOSIS — E785 Hyperlipidemia, unspecified: Secondary | ICD-10-CM | POA: Diagnosis not present

## 2020-01-21 DIAGNOSIS — E1142 Type 2 diabetes mellitus with diabetic polyneuropathy: Secondary | ICD-10-CM | POA: Diagnosis not present

## 2020-01-21 DIAGNOSIS — E1151 Type 2 diabetes mellitus with diabetic peripheral angiopathy without gangrene: Secondary | ICD-10-CM | POA: Diagnosis not present

## 2020-01-21 DIAGNOSIS — K219 Gastro-esophageal reflux disease without esophagitis: Secondary | ICD-10-CM | POA: Diagnosis not present

## 2020-01-21 DIAGNOSIS — G43909 Migraine, unspecified, not intractable, without status migrainosus: Secondary | ICD-10-CM | POA: Diagnosis not present

## 2020-01-21 DIAGNOSIS — Z9181 History of falling: Secondary | ICD-10-CM | POA: Diagnosis not present

## 2020-01-21 DIAGNOSIS — S2242XD Multiple fractures of ribs, left side, subsequent encounter for fracture with routine healing: Secondary | ICD-10-CM | POA: Diagnosis not present

## 2020-01-21 DIAGNOSIS — I2510 Atherosclerotic heart disease of native coronary artery without angina pectoris: Secondary | ICD-10-CM | POA: Diagnosis not present

## 2020-01-21 DIAGNOSIS — S52612D Displaced fracture of left ulna styloid process, subsequent encounter for closed fracture with routine healing: Secondary | ICD-10-CM | POA: Diagnosis not present

## 2020-01-21 DIAGNOSIS — I119 Hypertensive heart disease without heart failure: Secondary | ICD-10-CM | POA: Diagnosis not present

## 2020-01-21 DIAGNOSIS — M1990 Unspecified osteoarthritis, unspecified site: Secondary | ICD-10-CM | POA: Diagnosis not present

## 2020-01-21 DIAGNOSIS — Z7984 Long term (current) use of oral hypoglycemic drugs: Secondary | ICD-10-CM | POA: Diagnosis not present

## 2020-01-21 DIAGNOSIS — Z7902 Long term (current) use of antithrombotics/antiplatelets: Secondary | ICD-10-CM | POA: Diagnosis not present

## 2020-01-21 DIAGNOSIS — S92421D Displaced fracture of distal phalanx of right great toe, subsequent encounter for fracture with routine healing: Secondary | ICD-10-CM | POA: Diagnosis not present

## 2020-01-21 DIAGNOSIS — Z8673 Personal history of transient ischemic attack (TIA), and cerebral infarction without residual deficits: Secondary | ICD-10-CM | POA: Diagnosis not present

## 2020-01-23 DIAGNOSIS — I2510 Atherosclerotic heart disease of native coronary artery without angina pectoris: Secondary | ICD-10-CM | POA: Diagnosis not present

## 2020-01-23 DIAGNOSIS — S2242XD Multiple fractures of ribs, left side, subsequent encounter for fracture with routine healing: Secondary | ICD-10-CM | POA: Diagnosis not present

## 2020-01-23 DIAGNOSIS — Z8673 Personal history of transient ischemic attack (TIA), and cerebral infarction without residual deficits: Secondary | ICD-10-CM | POA: Diagnosis not present

## 2020-01-23 DIAGNOSIS — G43909 Migraine, unspecified, not intractable, without status migrainosus: Secondary | ICD-10-CM | POA: Diagnosis not present

## 2020-01-23 DIAGNOSIS — I119 Hypertensive heart disease without heart failure: Secondary | ICD-10-CM | POA: Diagnosis not present

## 2020-01-23 DIAGNOSIS — S52612D Displaced fracture of left ulna styloid process, subsequent encounter for closed fracture with routine healing: Secondary | ICD-10-CM | POA: Diagnosis not present

## 2020-01-23 DIAGNOSIS — S92421D Displaced fracture of distal phalanx of right great toe, subsequent encounter for fracture with routine healing: Secondary | ICD-10-CM | POA: Diagnosis not present

## 2020-01-23 DIAGNOSIS — Z7984 Long term (current) use of oral hypoglycemic drugs: Secondary | ICD-10-CM | POA: Diagnosis not present

## 2020-01-23 DIAGNOSIS — K219 Gastro-esophageal reflux disease without esophagitis: Secondary | ICD-10-CM | POA: Diagnosis not present

## 2020-01-23 DIAGNOSIS — Z9181 History of falling: Secondary | ICD-10-CM | POA: Diagnosis not present

## 2020-01-23 DIAGNOSIS — E1151 Type 2 diabetes mellitus with diabetic peripheral angiopathy without gangrene: Secondary | ICD-10-CM | POA: Diagnosis not present

## 2020-01-23 DIAGNOSIS — E785 Hyperlipidemia, unspecified: Secondary | ICD-10-CM | POA: Diagnosis not present

## 2020-01-23 DIAGNOSIS — E1142 Type 2 diabetes mellitus with diabetic polyneuropathy: Secondary | ICD-10-CM | POA: Diagnosis not present

## 2020-01-23 DIAGNOSIS — Z7902 Long term (current) use of antithrombotics/antiplatelets: Secondary | ICD-10-CM | POA: Diagnosis not present

## 2020-01-23 DIAGNOSIS — M1990 Unspecified osteoarthritis, unspecified site: Secondary | ICD-10-CM | POA: Diagnosis not present

## 2020-01-27 DIAGNOSIS — I119 Hypertensive heart disease without heart failure: Secondary | ICD-10-CM | POA: Diagnosis not present

## 2020-01-27 DIAGNOSIS — S92421D Displaced fracture of distal phalanx of right great toe, subsequent encounter for fracture with routine healing: Secondary | ICD-10-CM | POA: Diagnosis not present

## 2020-01-27 DIAGNOSIS — G43909 Migraine, unspecified, not intractable, without status migrainosus: Secondary | ICD-10-CM | POA: Diagnosis not present

## 2020-01-27 DIAGNOSIS — Z9181 History of falling: Secondary | ICD-10-CM | POA: Diagnosis not present

## 2020-01-27 DIAGNOSIS — I2510 Atherosclerotic heart disease of native coronary artery without angina pectoris: Secondary | ICD-10-CM | POA: Diagnosis not present

## 2020-01-27 DIAGNOSIS — Z7984 Long term (current) use of oral hypoglycemic drugs: Secondary | ICD-10-CM | POA: Diagnosis not present

## 2020-01-27 DIAGNOSIS — E785 Hyperlipidemia, unspecified: Secondary | ICD-10-CM | POA: Diagnosis not present

## 2020-01-27 DIAGNOSIS — Z8673 Personal history of transient ischemic attack (TIA), and cerebral infarction without residual deficits: Secondary | ICD-10-CM | POA: Diagnosis not present

## 2020-01-27 DIAGNOSIS — E1142 Type 2 diabetes mellitus with diabetic polyneuropathy: Secondary | ICD-10-CM | POA: Diagnosis not present

## 2020-01-27 DIAGNOSIS — M1990 Unspecified osteoarthritis, unspecified site: Secondary | ICD-10-CM | POA: Diagnosis not present

## 2020-01-27 DIAGNOSIS — Z7902 Long term (current) use of antithrombotics/antiplatelets: Secondary | ICD-10-CM | POA: Diagnosis not present

## 2020-01-27 DIAGNOSIS — E1151 Type 2 diabetes mellitus with diabetic peripheral angiopathy without gangrene: Secondary | ICD-10-CM | POA: Diagnosis not present

## 2020-01-27 DIAGNOSIS — S2242XD Multiple fractures of ribs, left side, subsequent encounter for fracture with routine healing: Secondary | ICD-10-CM | POA: Diagnosis not present

## 2020-01-27 DIAGNOSIS — K219 Gastro-esophageal reflux disease without esophagitis: Secondary | ICD-10-CM | POA: Diagnosis not present

## 2020-01-27 DIAGNOSIS — S52612D Displaced fracture of left ulna styloid process, subsequent encounter for closed fracture with routine healing: Secondary | ICD-10-CM | POA: Diagnosis not present

## 2020-01-28 ENCOUNTER — Other Ambulatory Visit: Payer: Self-pay | Admitting: Family Medicine

## 2020-01-28 IMAGING — CT CT HEAD W/O CM
3 series · 16 of 47 positions shown, 19 images · non-contrast
Comparison: Head CT dated 10/03/2017.  Brain MRI dated 10/04/2017.

CLINICAL DATA: Focal neuro deficit greater than 6 hours, stroke
suspected. Weakness and LEFT-sided facial droop.

EXAM:
CT HEAD WITHOUT CONTRAST
TECHNIQUE: Contiguous axial images were obtained from the base of the skull
through the vertex without intravenous contrast.

[Series 2: head wo · axial · 0.47mm/px · z∈[-129,-4]mm · 10 of 31 slices shown, 13 images]
[im 3/31  brain]
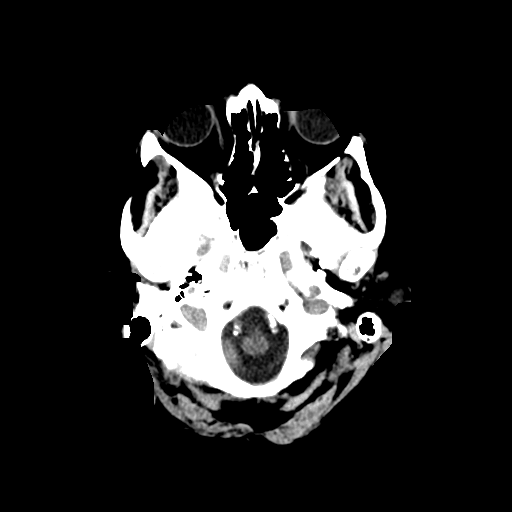
[im 3/31  bone]
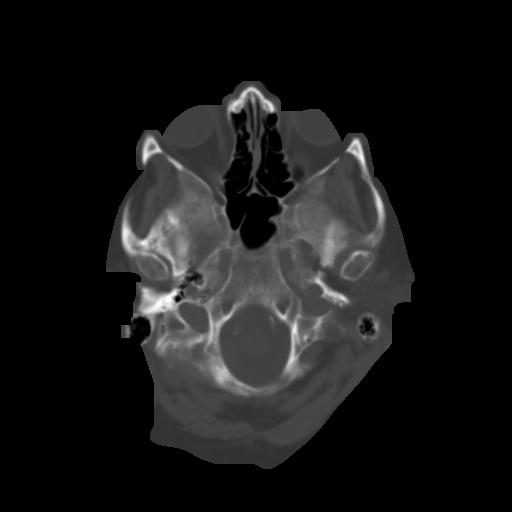
[im 6/31  brain]
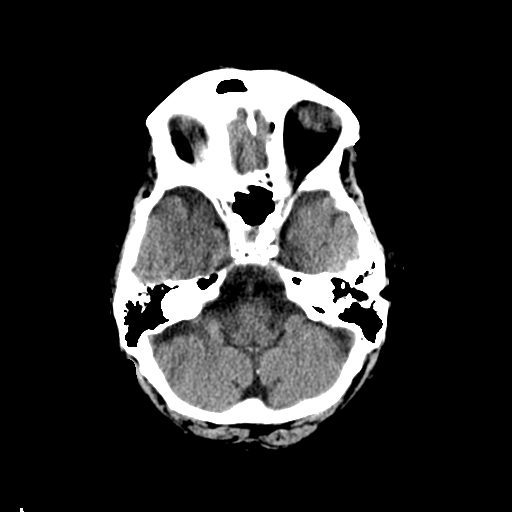
[im 9/31  brain]
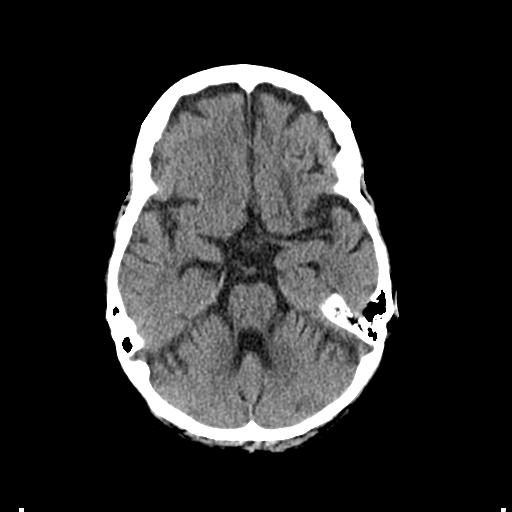
[im 11/31  brain]
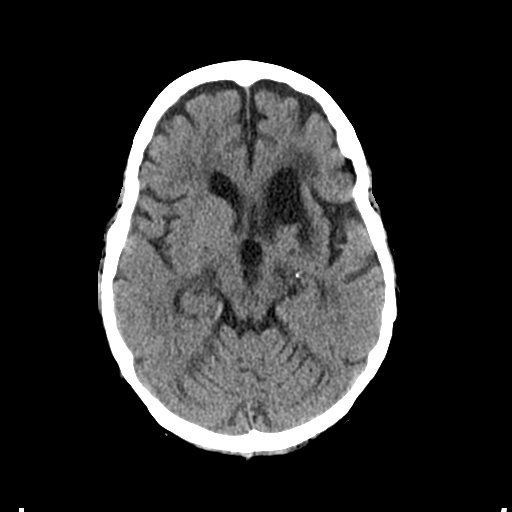
[im 14/31  brain]
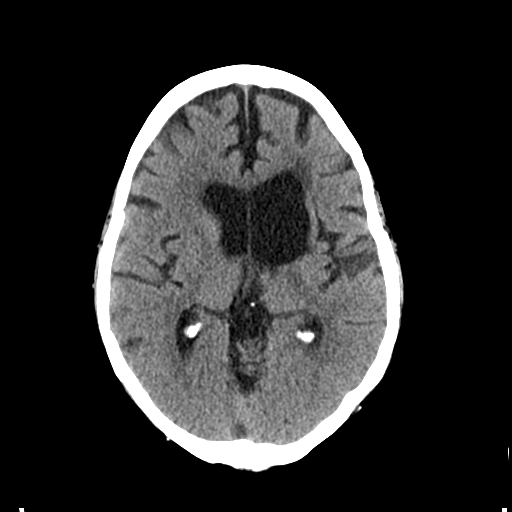
[im 14/31  bone]
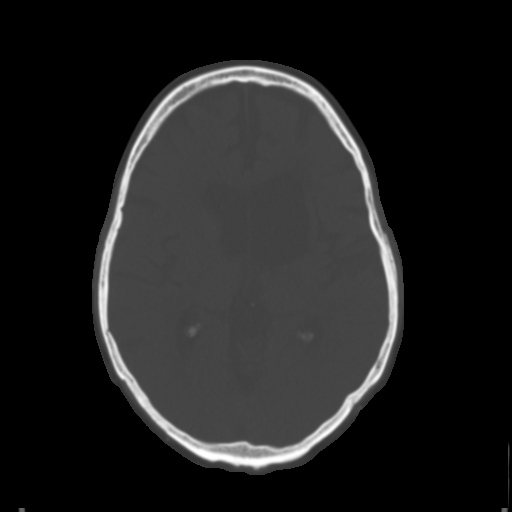
[im 17/31  brain]
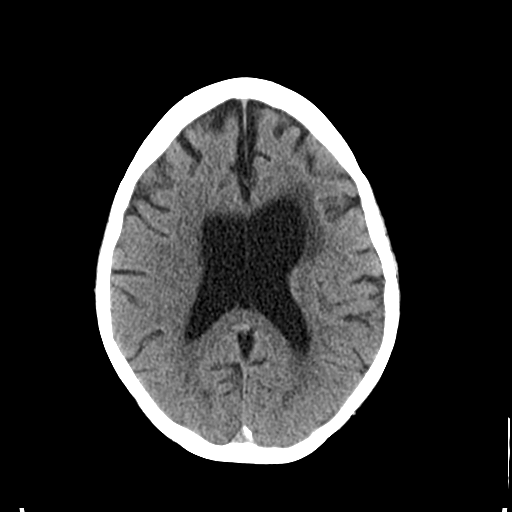
[im 20/31  brain]
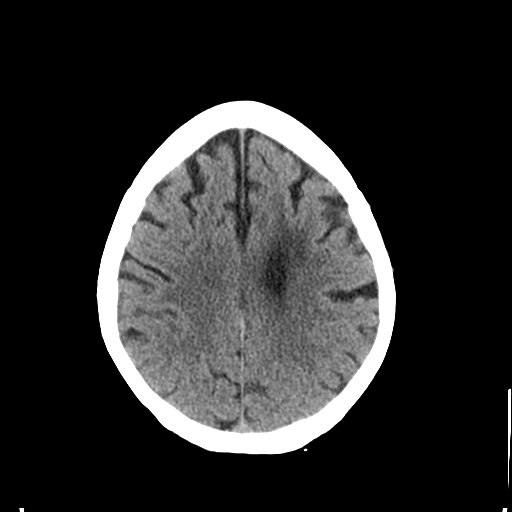
[im 23/31  brain]
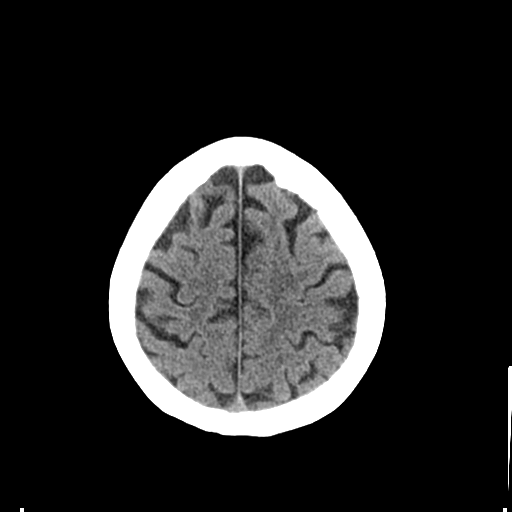
[im 25/31  brain]
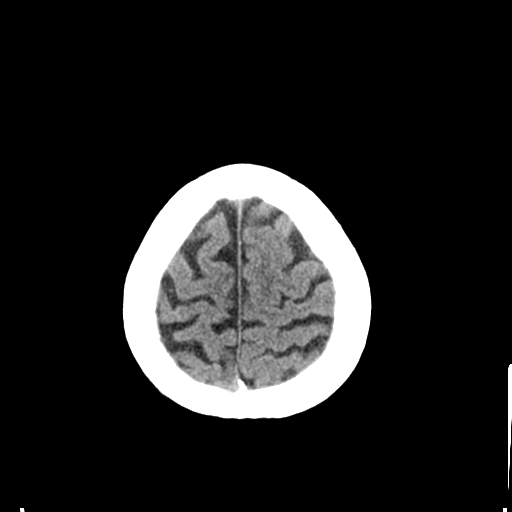
[im 25/31  bone]
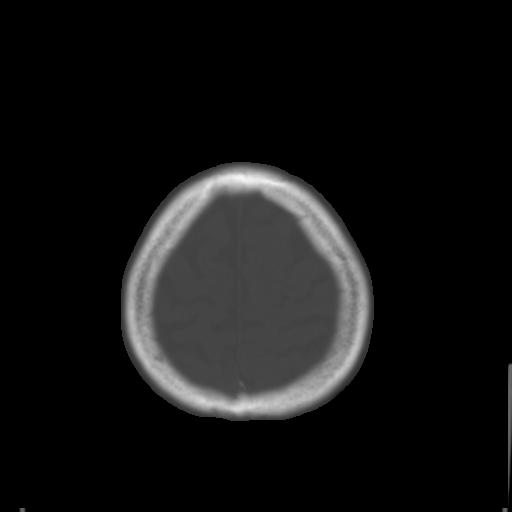
[im 28/31  brain]
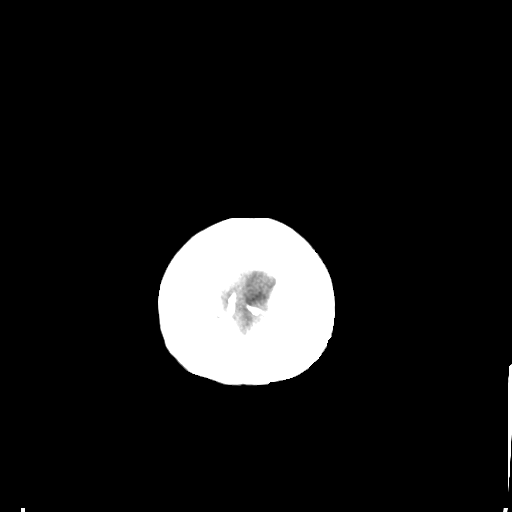

[Series 4: coronal soft tissue · coronal · 0.30mm/px · 3 of 66 slices shown]
[im 22/66  brain]
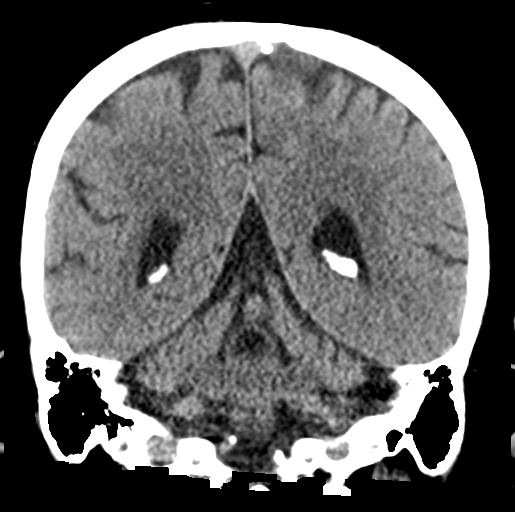
[im 29/66  brain]
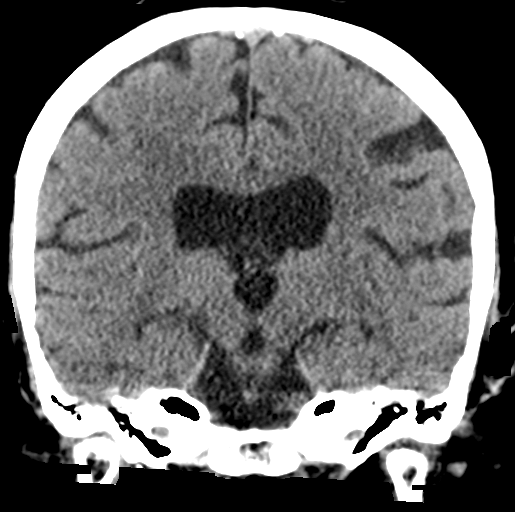
[im 37/66  brain]
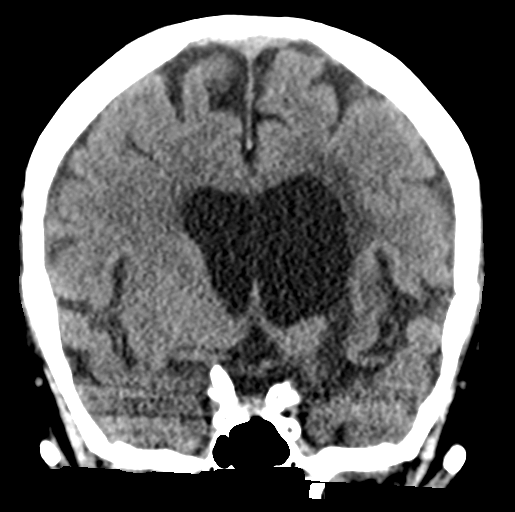

[Series 5: sagittal soft tissue · sagittal · 0.31mm/px · 3 of 51 slices shown]
[im 17/51  brain]
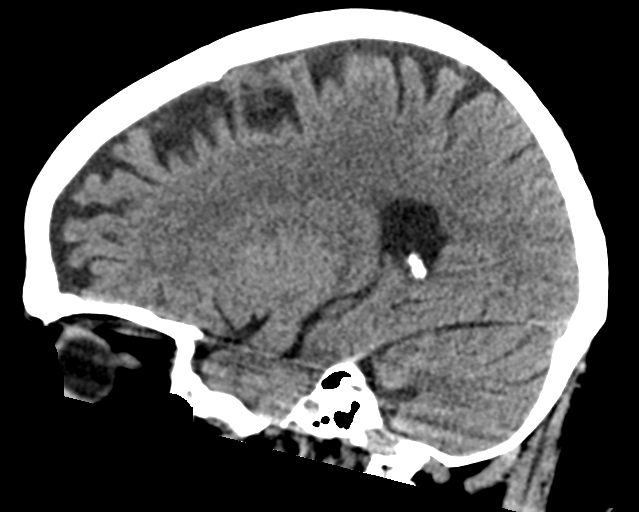
[im 26/51  brain]
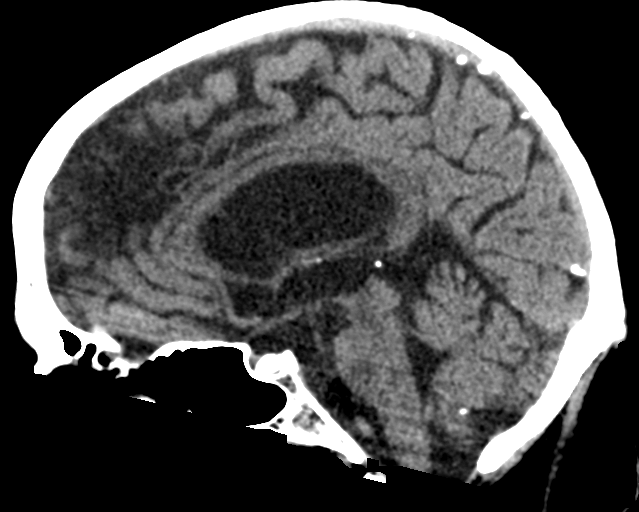
[im 34/51  brain]
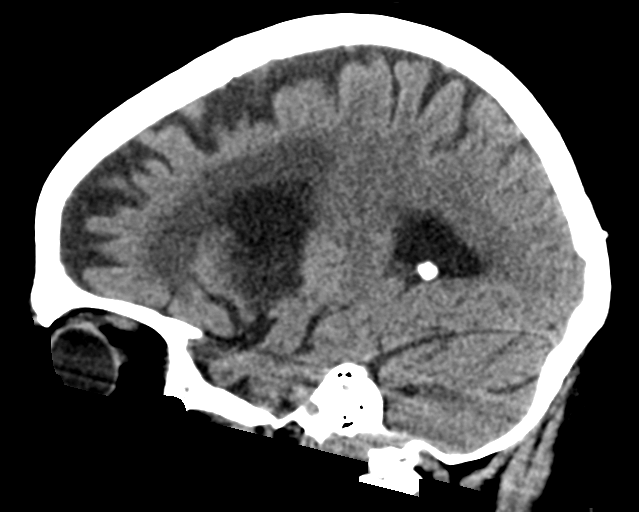

[16 of 47 positions shown; findings below may reference images not displayed]

FINDINGS: Brain: Ventricles are stable in size and configuration. Chronic
ischemic changes again noted within the periventricular white matter
regions bilaterally and LEFT basal ganglia with associated
encephalomalacia.

There is no mass, hemorrhage, edema or other evidence of acute
parenchymal abnormality. No extra-axial hemorrhage.

Vascular: Chronic calcified atherosclerotic changes of the large
vessels at the skull base. No unexpected hyperdense vessel.

Skull: Normal. Negative for fracture or focal lesion.

Sinuses/Orbits: No acute finding.

Other: None.
IMPRESSION: 1. No acute findings.  No intracranial mass, hemorrhage or edema.
2. Chronic ischemic changes within the white matter and LEFT basal
ganglia.

## 2020-01-29 DIAGNOSIS — S2242XD Multiple fractures of ribs, left side, subsequent encounter for fracture with routine healing: Secondary | ICD-10-CM | POA: Diagnosis not present

## 2020-01-29 DIAGNOSIS — Z7902 Long term (current) use of antithrombotics/antiplatelets: Secondary | ICD-10-CM | POA: Diagnosis not present

## 2020-01-29 DIAGNOSIS — I2510 Atherosclerotic heart disease of native coronary artery without angina pectoris: Secondary | ICD-10-CM | POA: Diagnosis not present

## 2020-01-29 DIAGNOSIS — G43909 Migraine, unspecified, not intractable, without status migrainosus: Secondary | ICD-10-CM | POA: Diagnosis not present

## 2020-01-29 DIAGNOSIS — Z7984 Long term (current) use of oral hypoglycemic drugs: Secondary | ICD-10-CM | POA: Diagnosis not present

## 2020-01-29 DIAGNOSIS — S52612D Displaced fracture of left ulna styloid process, subsequent encounter for closed fracture with routine healing: Secondary | ICD-10-CM | POA: Diagnosis not present

## 2020-01-29 DIAGNOSIS — S92421D Displaced fracture of distal phalanx of right great toe, subsequent encounter for fracture with routine healing: Secondary | ICD-10-CM | POA: Diagnosis not present

## 2020-01-29 DIAGNOSIS — Z8673 Personal history of transient ischemic attack (TIA), and cerebral infarction without residual deficits: Secondary | ICD-10-CM | POA: Diagnosis not present

## 2020-01-29 DIAGNOSIS — K219 Gastro-esophageal reflux disease without esophagitis: Secondary | ICD-10-CM | POA: Diagnosis not present

## 2020-01-29 DIAGNOSIS — M1990 Unspecified osteoarthritis, unspecified site: Secondary | ICD-10-CM | POA: Diagnosis not present

## 2020-01-29 DIAGNOSIS — E1142 Type 2 diabetes mellitus with diabetic polyneuropathy: Secondary | ICD-10-CM | POA: Diagnosis not present

## 2020-01-29 DIAGNOSIS — I119 Hypertensive heart disease without heart failure: Secondary | ICD-10-CM | POA: Diagnosis not present

## 2020-01-29 DIAGNOSIS — E1151 Type 2 diabetes mellitus with diabetic peripheral angiopathy without gangrene: Secondary | ICD-10-CM | POA: Diagnosis not present

## 2020-01-29 DIAGNOSIS — Z9181 History of falling: Secondary | ICD-10-CM | POA: Diagnosis not present

## 2020-01-29 DIAGNOSIS — E785 Hyperlipidemia, unspecified: Secondary | ICD-10-CM | POA: Diagnosis not present

## 2020-01-29 IMAGING — CT CT ANGIO NECK
2 of 11 series · 7 of 33 positions shown · IV contrast (omnipaque)
Comparison: Head CT from yesterday

CLINICAL DATA: Left-sided weakness.  Stroke workup.

EXAM:
CT ANGIOGRAPHY HEAD AND NECK
TECHNIQUE: Multidetector CT imaging of the head and neck was performed using
the standard protocol during bolus administration of intravenous
contrast. Multiplanar CT image reconstructions and MIPs were
obtained to evaluate the vascular anatomy. Carotid stenosis
measurements (when applicable) are obtained utilizing NASCET
criteria, using the distal internal carotid diameter as the
denominator.
CONTRAST:  75mL OMNIPAQUE IOHEXOL 350 MG/ML SOLN

[Series 8: cta head neck · axial · 0.44mm/px · z∈[-250,-136]mm · 2 of 173 slices shown]
[im 58/173  soft-tissue]
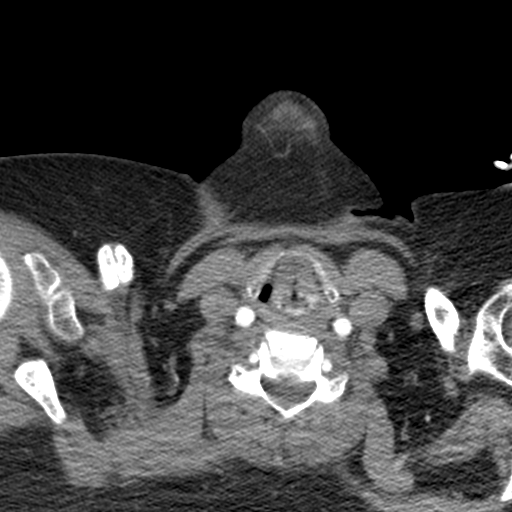
[im 115/173  bone]
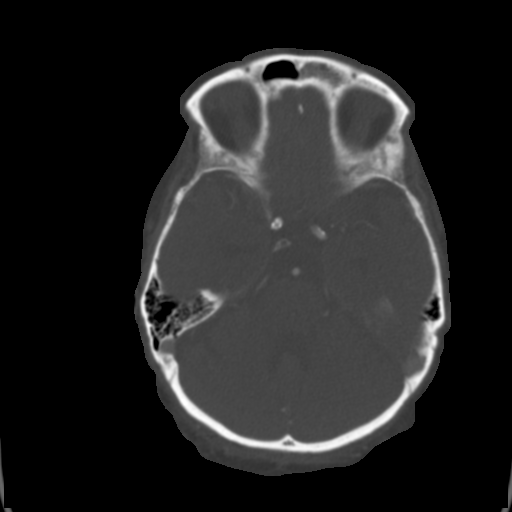

[Series 10: ax thin · axial · 0.35mm/px · z∈[-326,-104]mm · 5 of 339 slices shown]
[im 57/339  soft-tissue]
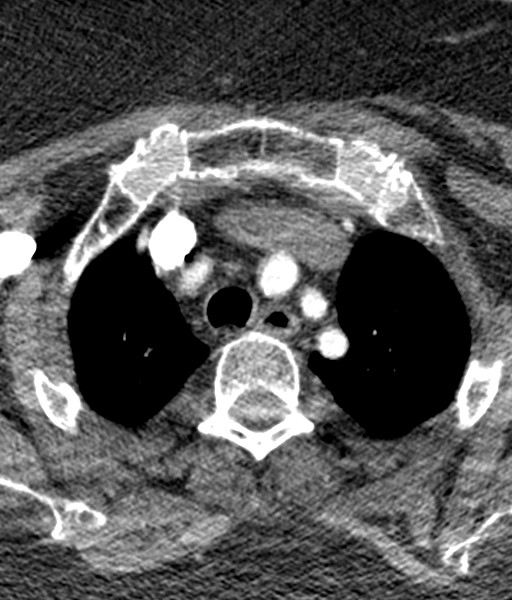
[im 113/339  soft-tissue]
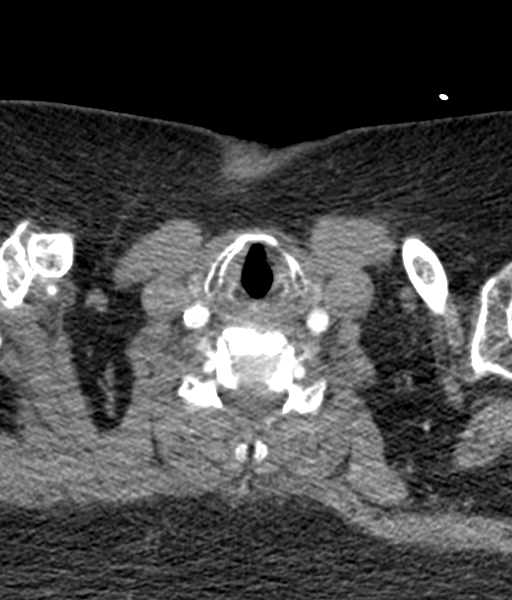
[im 170/339  soft-tissue]
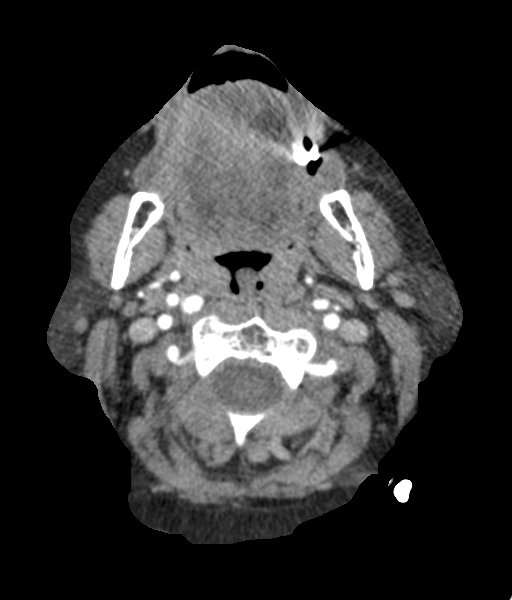
[im 226/339  soft-tissue]
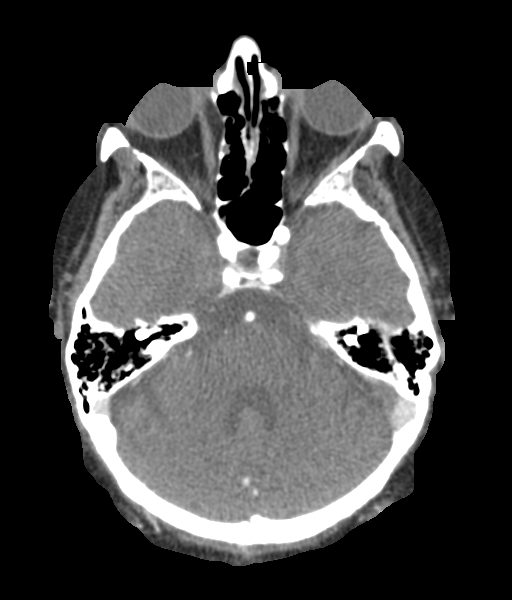
[im 282/339  soft-tissue]
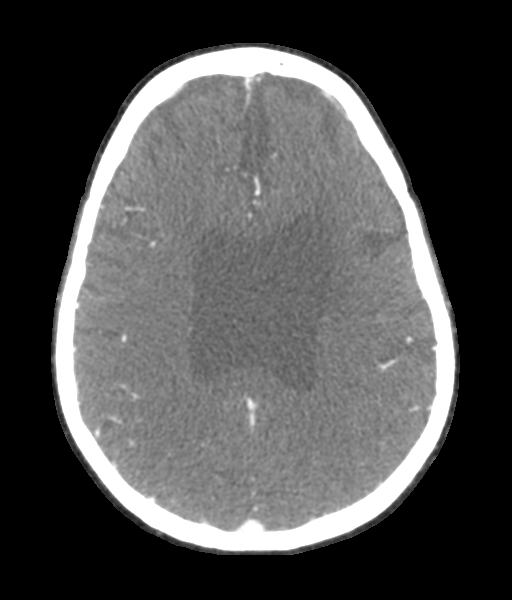

[7 of 33 positions shown; findings below may reference images not displayed]

FINDINGS: CT HEAD FINDINGS

Brain: Remote left lateral lenticulostriate infarct affecting left
basal ganglia and regional white matter. Atrophy, most notably of
the cerebellum.

Vascular: See below.

Skull: No acute finding

Sinuses: Negative

Orbits: Probable remote blowout fracture on the right. No acute
finding.

Review of the MIP images confirms the above findings

CTA NECK FINDINGS

Aortic arch: Atherosclerotic plaque.  Three vessel branching.

Right carotid system: Calcified plaque at the common carotid
bifurcation and ICA bulb. ICA origin stenosis measures 60% on
sagittal reformats. No beading or dissection seen.

Left carotid system: Prominent calcified plaque at the common
carotid bifurcation. Proximal ICA stenosis measures 45% on coronal
reformats. No downstream beading or dissection.

Vertebral arteries: Bilateral proximal subclavian calcified plaque.
On the right this stenosis measures up to 50% when measured on
coronal reformats. Both vertebral arteries are smooth and widely
patent to the dura.

Skeleton: Remote nonunited right clavicle fracture. Cervical disc
degeneration. Osteopenia

Other neck: No evidence of mass or adenopathy.

Upper chest: Negative

Review of the MIP images confirms the above findings

CTA HEAD FINDINGS

Anterior circulation: Confluent calcified plaque on the carotid
siphons. Plaque blooming limits quantification of stenosis; no focal
stenosis is suspected. Cut off of right A2, which correlates with
history of leg symptoms. MCA branches are symmetrically patent. Best
seen on MIPS there is prominent, asymmetric collaterals extending
over the right cerebral convexity. These can be compensatory in the
acute or chronic setting.

Posterior circulation: Confluent calcified plaque on the V4 segments
with mild generalized luminal narrowing. The vertebral and basilar
arteries are diffusely smooth. Asymmetrically small right PCA
without evidence of focal inter lying stenosis. Question if there is
duplicated right PCA from the posterior communicating artery.

Venous sinuses: Patent

Anatomic variants: As above

Delayed phase: No abnormal intracranial enhancement

Review of the MIP images confirms the above findings
IMPRESSION: 1. Right A2 cut off/occlusion with prominent collaterals extending
over the right cerebral convexity. This correlates well with history
of left leg weakness. No detected infarct by CT.
2. Carotid bifurcation atherosclerosis leading to 60% proximal ICA
stenosis on the left and 45% on the right.
3. 50% narrowing in the proximal right subclavian.

## 2020-01-29 IMAGING — MR MR HEAD W/O CM
12 of 13 series · 39 of 48 positions shown · non-contrast
Comparison: Head CT/CTA 04/01/2018

Brain MRI 10/04/2017

CLINICAL DATA: Stroke follow-up. Slurred speech and left arm and
leg weakness.

EXAM:
MRI HEAD WITHOUT CONTRAST
TECHNIQUE: Multiplanar, multiecho pulse sequences of the brain and surrounding
structures were obtained without intravenous contrast.

[Series 5: ax dwi_tracew · axial · 3.0mm · 0.73mm/px · z∈[-82,+79]mm · 3 of 55 slices shown (1 of 2)]
[im 1/55]
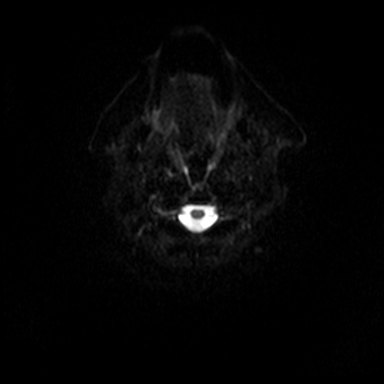
[im 28/55]
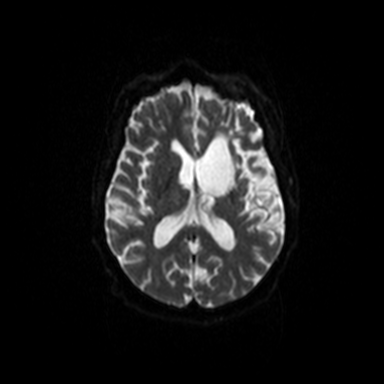
[im 55/55]
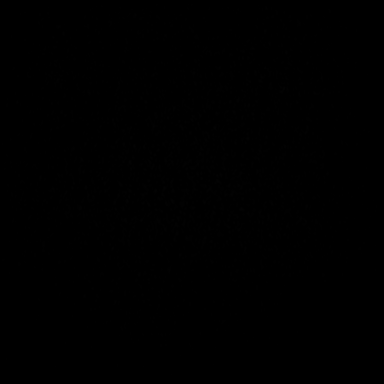

[Series 5: ax dwi_tracew · axial · 3.0mm · 0.73mm/px · z∈[-82,+79]mm · 3 of 55 slices shown (2 of 2)]
[im 1/55]
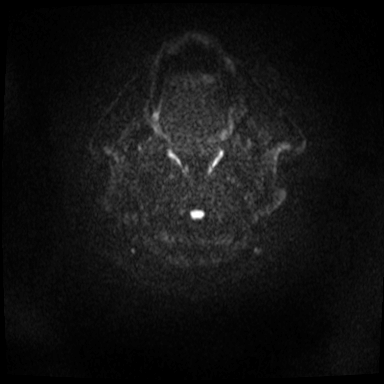
[im 28/55]
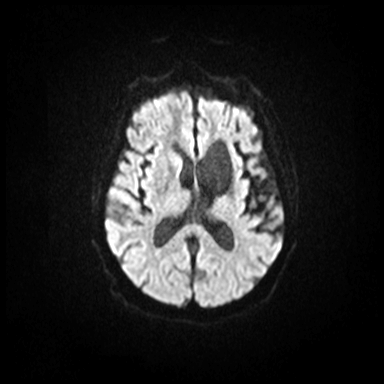
[im 55/55]
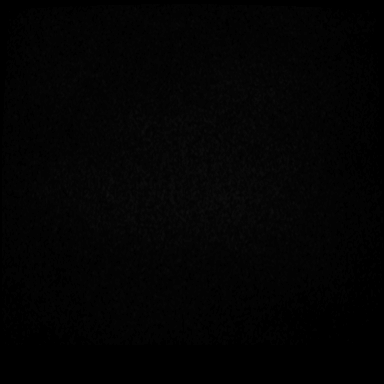

[Series 6: ax dwi_adc · axial · 3.0mm · 0.73mm/px · z∈[-82,+76]mm · 3 of 54 slices shown]
[im 1/54]
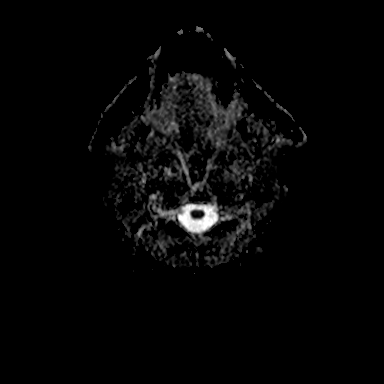
[im 27/54]
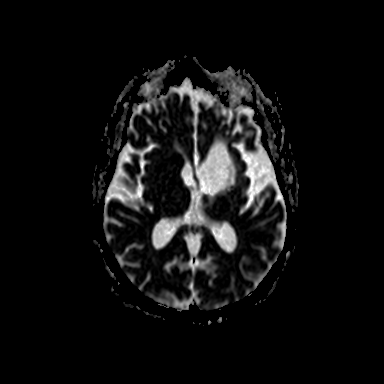
[im 54/54]
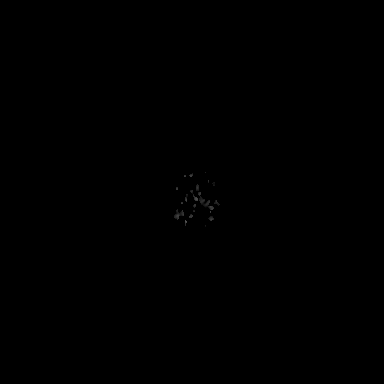

[Series 7: cor dwi_tracew · coronal · 5.0mm · 0.60mm/px · 3 of 45 slices shown (1 of 2)]
[im 1/45]
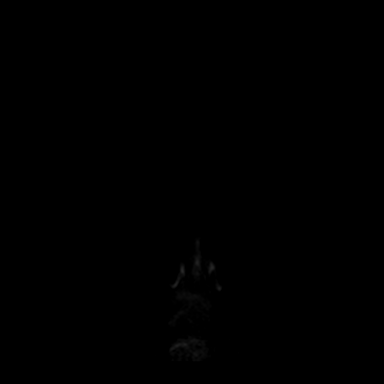
[im 23/45]
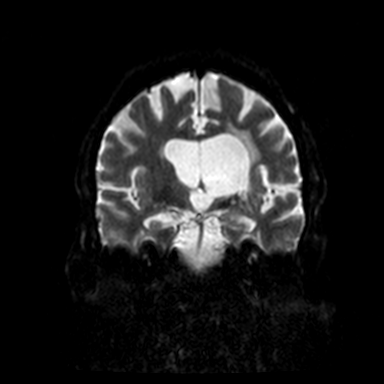
[im 45/45]
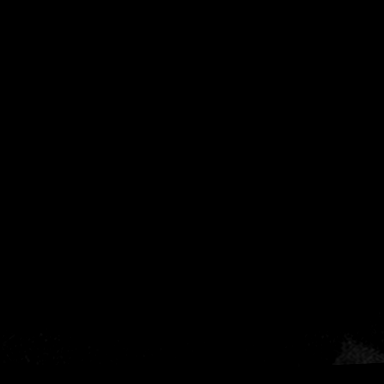

[Series 7: cor dwi_tracew · coronal · 5.0mm · 0.60mm/px · 3 of 45 slices shown (2 of 2)]
[im 1/45]
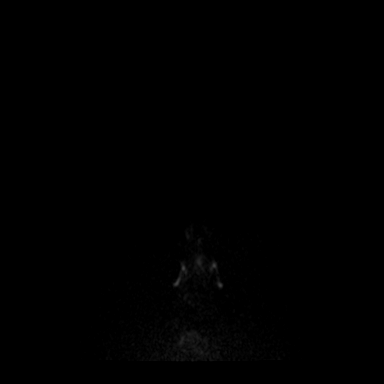
[im 23/45]
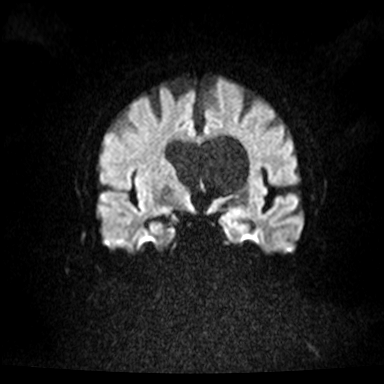
[im 45/45]
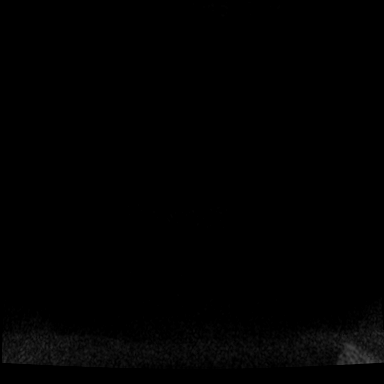

[Series 8: cor dwi_adc · coronal · 5.0mm · 0.60mm/px · 3 of 45 slices shown]
[im 1/45]
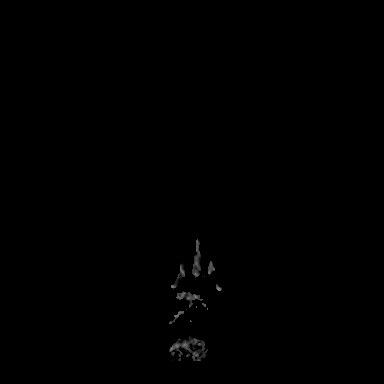
[im 23/45]
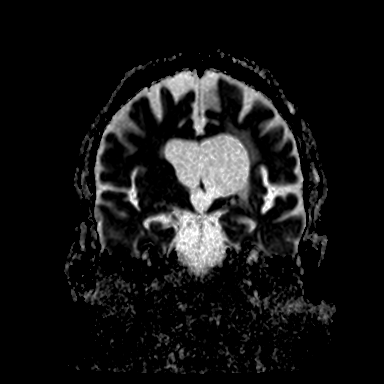
[im 45/45]
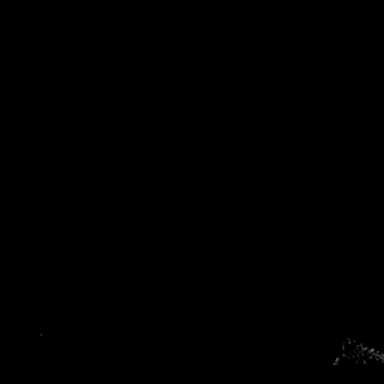

[Series 9: T1 · sagittal · 5.0mm · 0.62mm/px · 2 of 25 slices shown (1 of 2)]
[im 1/25]
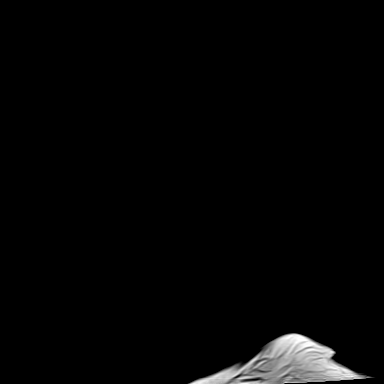
[im 25/25]
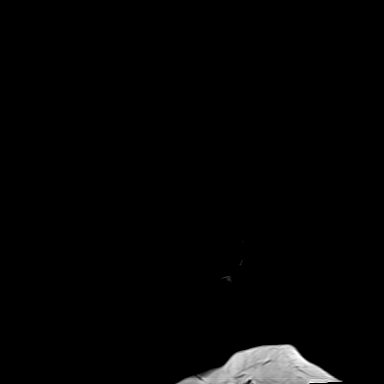

[Series 10: T2 · axial · 5.0mm · 0.53mm/px · z∈[-66,+78]mm · 2 of 25 slices shown (1 of 2)]
[im 1/25]
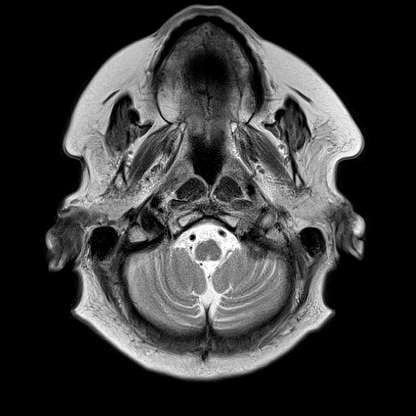
[im 25/25]
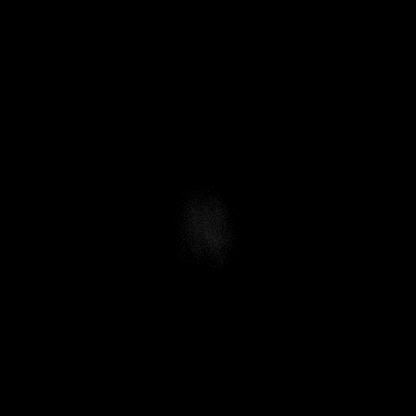

[Series 11: swi_images · axial · 3.0mm · 0.90mm/px · z∈[-83,+34]mm · 3 of 60 slices shown]
[im 1/60]
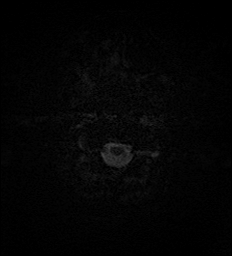
[im 20/60]
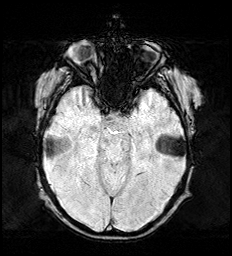
[im 40/60]
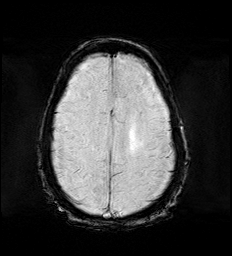

[Series 13: FLAIR · axial · 3.0mm · 0.53mm/px · z∈[-75,+87]mm · 4 of 55 slices shown]
[im 1/55]
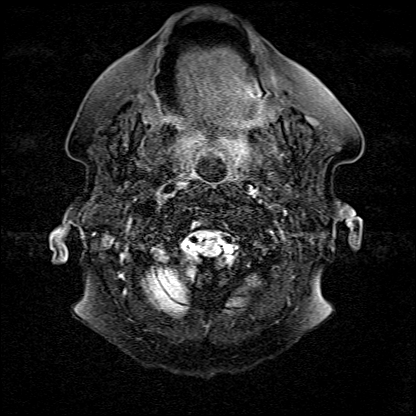
[im 19/55]
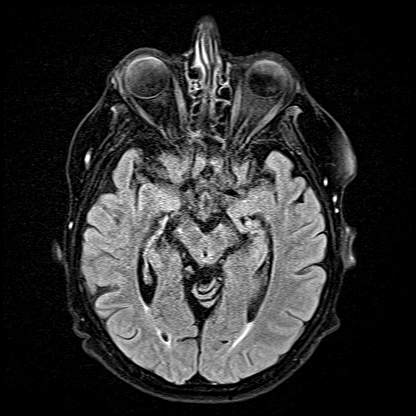
[im 37/55]
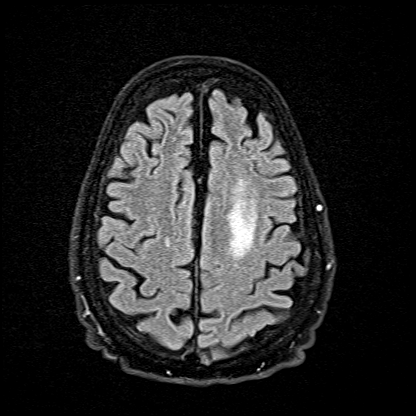
[im 55/55]
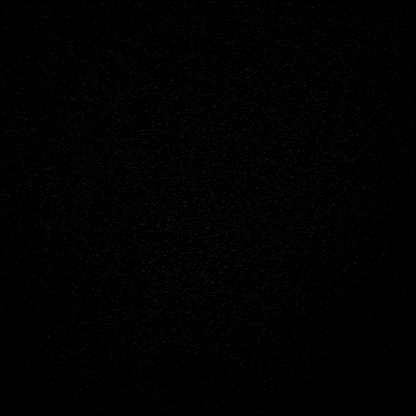

[Series 14: T1 · axial · 1.0mm · 0.98mm/px · z∈[-82,+91]mm · 8 of 174 slices shown (2 of 2)]
[im 1/174]
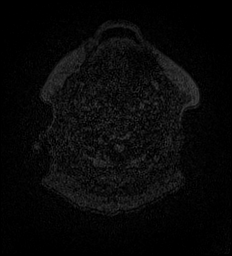
[im 32/174]
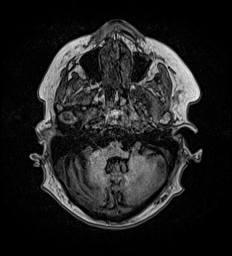
[im 48/174]
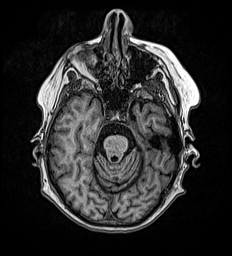
[im 79/174]
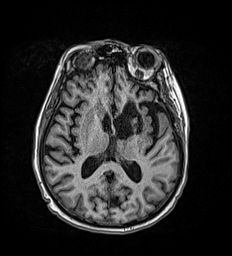
[im 95/174]
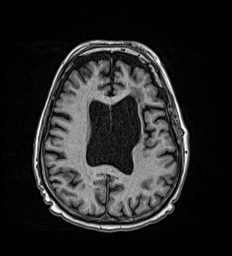
[im 126/174]
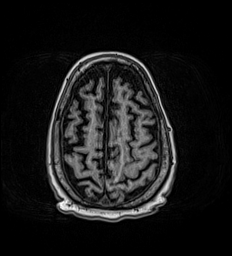
[im 142/174]
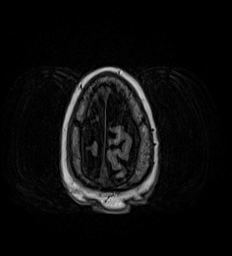
[im 174/174]
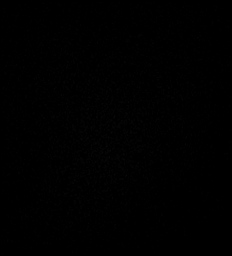

[Series 15: T2 · coronal · 5.0mm · 0.57mm/px · 2 of 29 slices shown (2 of 2)]
[im 1/29]
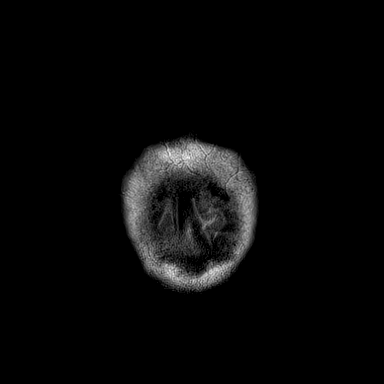
[im 29/29]
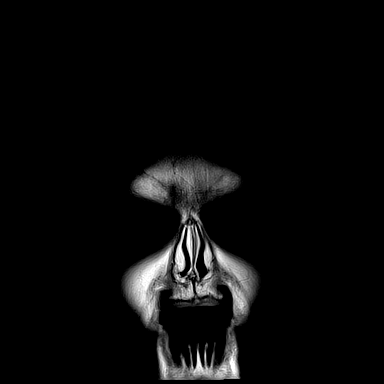

[39 of 48 positions shown; findings below may reference images not displayed]

FINDINGS: BRAIN: There is no acute infarct, acute hemorrhage or mass effect.
The midline structures are normal. Old left frontal lobe infarct and
multiple old gangliothalamic lacunar infarct. There is ex vacuo
dilatation of the left lateral ventricle. Aside from the sites of
prior infarcts, minimal periventricular white matter hyperintensity.
Atrophy is greater than expected for age. Susceptibility-sensitive
sequences show no chronic microhemorrhage or superficial siderosis.

VASCULAR: Major intracranial arterial and venous sinus flow voids
are preserved.

SKULL AND UPPER CERVICAL SPINE: The visualized skull base,
calvarium, upper cervical spine and extracranial soft tissues are
normal.

SINUSES/ORBITS: No fluid levels or advanced mucosal thickening. No
mastoid or middle ear effusion. The orbits are normal.
IMPRESSION: 1. No acute intracranial abnormality.
2. Old left frontal and bilateral gangliothalamic infarcts. Ex vacuo
dilatation of the left lateral ventricle and generalized mildly age
advanced atrophy.

## 2020-02-02 ENCOUNTER — Other Ambulatory Visit: Payer: Self-pay | Admitting: Family Medicine

## 2020-02-02 DIAGNOSIS — M1990 Unspecified osteoarthritis, unspecified site: Secondary | ICD-10-CM | POA: Diagnosis not present

## 2020-02-02 DIAGNOSIS — Z8673 Personal history of transient ischemic attack (TIA), and cerebral infarction without residual deficits: Secondary | ICD-10-CM | POA: Diagnosis not present

## 2020-02-02 DIAGNOSIS — E1142 Type 2 diabetes mellitus with diabetic polyneuropathy: Secondary | ICD-10-CM | POA: Diagnosis not present

## 2020-02-02 DIAGNOSIS — S92421D Displaced fracture of distal phalanx of right great toe, subsequent encounter for fracture with routine healing: Secondary | ICD-10-CM | POA: Diagnosis not present

## 2020-02-02 DIAGNOSIS — E1151 Type 2 diabetes mellitus with diabetic peripheral angiopathy without gangrene: Secondary | ICD-10-CM | POA: Diagnosis not present

## 2020-02-02 DIAGNOSIS — G43909 Migraine, unspecified, not intractable, without status migrainosus: Secondary | ICD-10-CM | POA: Diagnosis not present

## 2020-02-02 DIAGNOSIS — S52612D Displaced fracture of left ulna styloid process, subsequent encounter for closed fracture with routine healing: Secondary | ICD-10-CM | POA: Diagnosis not present

## 2020-02-02 DIAGNOSIS — E785 Hyperlipidemia, unspecified: Secondary | ICD-10-CM | POA: Diagnosis not present

## 2020-02-02 DIAGNOSIS — I2510 Atherosclerotic heart disease of native coronary artery without angina pectoris: Secondary | ICD-10-CM | POA: Diagnosis not present

## 2020-02-02 DIAGNOSIS — Z7902 Long term (current) use of antithrombotics/antiplatelets: Secondary | ICD-10-CM | POA: Diagnosis not present

## 2020-02-02 DIAGNOSIS — I119 Hypertensive heart disease without heart failure: Secondary | ICD-10-CM | POA: Diagnosis not present

## 2020-02-02 DIAGNOSIS — Z9181 History of falling: Secondary | ICD-10-CM | POA: Diagnosis not present

## 2020-02-02 DIAGNOSIS — K219 Gastro-esophageal reflux disease without esophagitis: Secondary | ICD-10-CM | POA: Diagnosis not present

## 2020-02-02 DIAGNOSIS — Z7984 Long term (current) use of oral hypoglycemic drugs: Secondary | ICD-10-CM | POA: Diagnosis not present

## 2020-02-02 DIAGNOSIS — S2242XD Multiple fractures of ribs, left side, subsequent encounter for fracture with routine healing: Secondary | ICD-10-CM | POA: Diagnosis not present

## 2020-02-02 NOTE — Telephone Encounter (Signed)
Requested medication (s) are due for refill today: yes  Requested medication (s) are on the active medication list: yes  Last refill:  12/10/19  Future visit scheduled: yes  Notes to clinic:  med not delegated to NT to RF   Requested Prescriptions  Pending Prescriptions Disp Refills   HYDROcodone-acetaminophen (NORCO) 5-325 MG tablet 30 tablet 0    Sig: Take 2 tablets by mouth every 6 (six) hours as needed for moderate pain.      Not Delegated - Analgesics:  Opioid Agonist Combinations Failed - 02/02/2020  3:16 PM      Failed - This refill cannot be delegated      Failed - Urine Drug Screen completed in last 360 days.      Passed - Valid encounter within last 6 months    Recent Outpatient Visits           2 weeks ago Sinusitis, unspecified chronicity, unspecified location   [facility] Jerrol Banana., MD   2 months ago 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)   [facility] Jerrol Banana., MD   3 months ago Nonintractable headache, unspecified chronicity pattern, unspecified headache type   [place], [place]   4 months ago Recurrent cystitis   [place], [place]   4 months ago Fall, initial encounter   [facility] Gu[place], Wendee Beavers, [place]       Future Appointments             In 2 weeks Jerrol Banana., MD [facility], [place]

## 2020-02-02 NOTE — Telephone Encounter (Signed)
Medication Refill - Medication: hydrocodone   Has the patient contacted their pharmacy? Yes.   (Agent: If no, request that the patient contact the pharmacy for the refill.) (Agent: If yes, when and what did the pharmacy advise?)  Preferred Pharmacy (with phone number or street name):  [id number] [address]on, [place] [address]  [place] [address]  Phone: [phone] Fax: [phone]  Hours: Not open 24 hours     Agent: Please be advised that RX refills may take up to 3 business days. We ask that you follow-up with your pharmacy.

## 2020-02-03 DIAGNOSIS — G43909 Migraine, unspecified, not intractable, without status migrainosus: Secondary | ICD-10-CM | POA: Diagnosis not present

## 2020-02-03 DIAGNOSIS — Z7902 Long term (current) use of antithrombotics/antiplatelets: Secondary | ICD-10-CM | POA: Diagnosis not present

## 2020-02-03 DIAGNOSIS — Z7984 Long term (current) use of oral hypoglycemic drugs: Secondary | ICD-10-CM | POA: Diagnosis not present

## 2020-02-03 DIAGNOSIS — S2242XD Multiple fractures of ribs, left side, subsequent encounter for fracture with routine healing: Secondary | ICD-10-CM | POA: Diagnosis not present

## 2020-02-03 DIAGNOSIS — E1151 Type 2 diabetes mellitus with diabetic peripheral angiopathy without gangrene: Secondary | ICD-10-CM | POA: Diagnosis not present

## 2020-02-03 DIAGNOSIS — E785 Hyperlipidemia, unspecified: Secondary | ICD-10-CM | POA: Diagnosis not present

## 2020-02-03 DIAGNOSIS — I119 Hypertensive heart disease without heart failure: Secondary | ICD-10-CM | POA: Diagnosis not present

## 2020-02-03 DIAGNOSIS — S52612D Displaced fracture of left ulna styloid process, subsequent encounter for closed fracture with routine healing: Secondary | ICD-10-CM | POA: Diagnosis not present

## 2020-02-03 DIAGNOSIS — Z8673 Personal history of transient ischemic attack (TIA), and cerebral infarction without residual deficits: Secondary | ICD-10-CM | POA: Diagnosis not present

## 2020-02-03 DIAGNOSIS — E1142 Type 2 diabetes mellitus with diabetic polyneuropathy: Secondary | ICD-10-CM | POA: Diagnosis not present

## 2020-02-03 DIAGNOSIS — S92421D Displaced fracture of distal phalanx of right great toe, subsequent encounter for fracture with routine healing: Secondary | ICD-10-CM | POA: Diagnosis not present

## 2020-02-03 DIAGNOSIS — Z9181 History of falling: Secondary | ICD-10-CM | POA: Diagnosis not present

## 2020-02-03 DIAGNOSIS — K219 Gastro-esophageal reflux disease without esophagitis: Secondary | ICD-10-CM | POA: Diagnosis not present

## 2020-02-03 DIAGNOSIS — I2510 Atherosclerotic heart disease of native coronary artery without angina pectoris: Secondary | ICD-10-CM | POA: Diagnosis not present

## 2020-02-03 DIAGNOSIS — M1990 Unspecified osteoarthritis, unspecified site: Secondary | ICD-10-CM | POA: Diagnosis not present

## 2020-02-04 ENCOUNTER — Other Ambulatory Visit: Payer: Self-pay

## 2020-02-04 DIAGNOSIS — E785 Hyperlipidemia, unspecified: Secondary | ICD-10-CM | POA: Diagnosis not present

## 2020-02-04 DIAGNOSIS — Z9181 History of falling: Secondary | ICD-10-CM | POA: Diagnosis not present

## 2020-02-04 DIAGNOSIS — Z8673 Personal history of transient ischemic attack (TIA), and cerebral infarction without residual deficits: Secondary | ICD-10-CM | POA: Diagnosis not present

## 2020-02-04 DIAGNOSIS — M1990 Unspecified osteoarthritis, unspecified site: Secondary | ICD-10-CM | POA: Diagnosis not present

## 2020-02-04 DIAGNOSIS — S2242XD Multiple fractures of ribs, left side, subsequent encounter for fracture with routine healing: Secondary | ICD-10-CM | POA: Diagnosis not present

## 2020-02-04 DIAGNOSIS — E1151 Type 2 diabetes mellitus with diabetic peripheral angiopathy without gangrene: Secondary | ICD-10-CM | POA: Diagnosis not present

## 2020-02-04 DIAGNOSIS — K219 Gastro-esophageal reflux disease without esophagitis: Secondary | ICD-10-CM | POA: Diagnosis not present

## 2020-02-04 DIAGNOSIS — E1142 Type 2 diabetes mellitus with diabetic polyneuropathy: Secondary | ICD-10-CM | POA: Diagnosis not present

## 2020-02-04 DIAGNOSIS — I119 Hypertensive heart disease without heart failure: Secondary | ICD-10-CM | POA: Diagnosis not present

## 2020-02-04 DIAGNOSIS — Z7984 Long term (current) use of oral hypoglycemic drugs: Secondary | ICD-10-CM | POA: Diagnosis not present

## 2020-02-04 DIAGNOSIS — I2510 Atherosclerotic heart disease of native coronary artery without angina pectoris: Secondary | ICD-10-CM | POA: Diagnosis not present

## 2020-02-04 DIAGNOSIS — Z7902 Long term (current) use of antithrombotics/antiplatelets: Secondary | ICD-10-CM | POA: Diagnosis not present

## 2020-02-04 DIAGNOSIS — S52612D Displaced fracture of left ulna styloid process, subsequent encounter for closed fracture with routine healing: Secondary | ICD-10-CM | POA: Diagnosis not present

## 2020-02-04 DIAGNOSIS — S92421D Displaced fracture of distal phalanx of right great toe, subsequent encounter for fracture with routine healing: Secondary | ICD-10-CM | POA: Diagnosis not present

## 2020-02-04 DIAGNOSIS — G43909 Migraine, unspecified, not intractable, without status migrainosus: Secondary | ICD-10-CM | POA: Diagnosis not present

## 2020-02-04 MED ORDER — HYDROCODONE-ACETAMINOPHEN 5-325 MG PO TABS: 2.0000 | ORAL_TABLET | Freq: Four times a day (QID) | ORAL | 0 refills | Status: DC | PRN

## 2020-02-04 NOTE — Telephone Encounter (Signed)
No--could you put it back in for me? thx

## 2020-02-04 NOTE — Telephone Encounter (Signed)
Dr. Rosanna Randy did you mean to print this prescription?

## 2020-02-04 NOTE — Addendum Note (Signed)
Addended by: Shawna Orleans on: 02/04/2020 11:17 AM   Modules accepted: Orders

## 2020-02-09 DIAGNOSIS — Z7984 Long term (current) use of oral hypoglycemic drugs: Secondary | ICD-10-CM | POA: Diagnosis not present

## 2020-02-09 DIAGNOSIS — Z8673 Personal history of transient ischemic attack (TIA), and cerebral infarction without residual deficits: Secondary | ICD-10-CM | POA: Diagnosis not present

## 2020-02-09 DIAGNOSIS — K219 Gastro-esophageal reflux disease without esophagitis: Secondary | ICD-10-CM | POA: Diagnosis not present

## 2020-02-09 DIAGNOSIS — S92421D Displaced fracture of distal phalanx of right great toe, subsequent encounter for fracture with routine healing: Secondary | ICD-10-CM | POA: Diagnosis not present

## 2020-02-09 DIAGNOSIS — Z7902 Long term (current) use of antithrombotics/antiplatelets: Secondary | ICD-10-CM | POA: Diagnosis not present

## 2020-02-09 DIAGNOSIS — I2510 Atherosclerotic heart disease of native coronary artery without angina pectoris: Secondary | ICD-10-CM | POA: Diagnosis not present

## 2020-02-09 DIAGNOSIS — G43909 Migraine, unspecified, not intractable, without status migrainosus: Secondary | ICD-10-CM | POA: Diagnosis not present

## 2020-02-09 DIAGNOSIS — E1142 Type 2 diabetes mellitus with diabetic polyneuropathy: Secondary | ICD-10-CM | POA: Diagnosis not present

## 2020-02-09 DIAGNOSIS — S52612D Displaced fracture of left ulna styloid process, subsequent encounter for closed fracture with routine healing: Secondary | ICD-10-CM | POA: Diagnosis not present

## 2020-02-09 DIAGNOSIS — Z9181 History of falling: Secondary | ICD-10-CM | POA: Diagnosis not present

## 2020-02-09 DIAGNOSIS — M1990 Unspecified osteoarthritis, unspecified site: Secondary | ICD-10-CM | POA: Diagnosis not present

## 2020-02-09 DIAGNOSIS — S2242XD Multiple fractures of ribs, left side, subsequent encounter for fracture with routine healing: Secondary | ICD-10-CM | POA: Diagnosis not present

## 2020-02-09 DIAGNOSIS — E1151 Type 2 diabetes mellitus with diabetic peripheral angiopathy without gangrene: Secondary | ICD-10-CM | POA: Diagnosis not present

## 2020-02-09 DIAGNOSIS — I119 Hypertensive heart disease without heart failure: Secondary | ICD-10-CM | POA: Diagnosis not present

## 2020-02-09 DIAGNOSIS — E785 Hyperlipidemia, unspecified: Secondary | ICD-10-CM | POA: Diagnosis not present

## 2020-02-10 DIAGNOSIS — Z8673 Personal history of transient ischemic attack (TIA), and cerebral infarction without residual deficits: Secondary | ICD-10-CM | POA: Diagnosis not present

## 2020-02-10 DIAGNOSIS — E785 Hyperlipidemia, unspecified: Secondary | ICD-10-CM | POA: Diagnosis not present

## 2020-02-10 DIAGNOSIS — Z9181 History of falling: Secondary | ICD-10-CM | POA: Diagnosis not present

## 2020-02-10 DIAGNOSIS — S92421D Displaced fracture of distal phalanx of right great toe, subsequent encounter for fracture with routine healing: Secondary | ICD-10-CM | POA: Diagnosis not present

## 2020-02-10 DIAGNOSIS — E1142 Type 2 diabetes mellitus with diabetic polyneuropathy: Secondary | ICD-10-CM | POA: Diagnosis not present

## 2020-02-10 DIAGNOSIS — K219 Gastro-esophageal reflux disease without esophagitis: Secondary | ICD-10-CM | POA: Diagnosis not present

## 2020-02-10 DIAGNOSIS — I119 Hypertensive heart disease without heart failure: Secondary | ICD-10-CM | POA: Diagnosis not present

## 2020-02-10 DIAGNOSIS — G43909 Migraine, unspecified, not intractable, without status migrainosus: Secondary | ICD-10-CM | POA: Diagnosis not present

## 2020-02-10 DIAGNOSIS — S2242XD Multiple fractures of ribs, left side, subsequent encounter for fracture with routine healing: Secondary | ICD-10-CM | POA: Diagnosis not present

## 2020-02-10 DIAGNOSIS — Z7902 Long term (current) use of antithrombotics/antiplatelets: Secondary | ICD-10-CM | POA: Diagnosis not present

## 2020-02-10 DIAGNOSIS — Z7984 Long term (current) use of oral hypoglycemic drugs: Secondary | ICD-10-CM | POA: Diagnosis not present

## 2020-02-10 DIAGNOSIS — S52612D Displaced fracture of left ulna styloid process, subsequent encounter for closed fracture with routine healing: Secondary | ICD-10-CM | POA: Diagnosis not present

## 2020-02-10 DIAGNOSIS — M1990 Unspecified osteoarthritis, unspecified site: Secondary | ICD-10-CM | POA: Diagnosis not present

## 2020-02-10 DIAGNOSIS — E1151 Type 2 diabetes mellitus with diabetic peripheral angiopathy without gangrene: Secondary | ICD-10-CM | POA: Diagnosis not present

## 2020-02-10 DIAGNOSIS — I2510 Atherosclerotic heart disease of native coronary artery without angina pectoris: Secondary | ICD-10-CM | POA: Diagnosis not present

## 2020-02-11 ENCOUNTER — Ambulatory Visit: Payer: Medicare Other | Admitting: Neurology

## 2020-02-11 ENCOUNTER — Encounter: Payer: Self-pay | Admitting: Neurology

## 2020-02-11 ENCOUNTER — Other Ambulatory Visit: Payer: Self-pay

## 2020-02-11 VITALS — BP 147/75 | HR 75 | Ht 61.0 in | Wt 231.0 lb

## 2020-02-11 DIAGNOSIS — I639 Cerebral infarction, unspecified: Secondary | ICD-10-CM

## 2020-02-11 DIAGNOSIS — I69354 Hemiplegia and hemiparesis following cerebral infarction affecting left non-dominant side: Secondary | ICD-10-CM | POA: Diagnosis not present

## 2020-02-11 DIAGNOSIS — G9389 Other specified disorders of brain: Secondary | ICD-10-CM | POA: Diagnosis not present

## 2020-02-11 DIAGNOSIS — I69320 Aphasia following cerebral infarction: Secondary | ICD-10-CM | POA: Insufficient documentation

## 2020-02-11 NOTE — Progress Notes (Signed)
PATIENT: Susan House DOB: 1956/07/02  REASON FOR VISIT: follow up- TIA , hospitalization for acute worsening- stroke, restless leg syndrome, left hemiparesis HISTORY FROM: patient and husband   HISTORY OF PRESENT ILLNESS:  64 y.o. year old female  has a past medical history of Allergy, Arthritis, Atypical chest pain, Carotid arterial disease ([place]), Cellulitis and abscess of face, Chronic Dizziness, Coronary artery calcification seen on CT scan, Depression, Diabetes mellitus ([place]), Diastolic dysfunction, Edema, GERD (gastroesophageal reflux disease), Headache, Hematuria, Hyperlipidemia, Hypertension, IBS (irritable bowel syndrome), Migraine, Morbid obesity ([place]), Nocturia, Possible Seizure ([place]), Stroke ([place]) (2000), TIA (transient ischemic attack) (2010 & 2019), Urgency of micturation, Urinary frequency, and Urinary incontinence. here with:  RV 02-11-2020- mrs House is here after a severe fall in April left her with multiple rip FRACTURES, LEFT WRIST FRACTURE. She had fallen on the head as well, CT was negative for bleed fr stroke but cognition changed after the fall. In June 2021 she had Aphasia and trouble to get words out.  I was able to review a history and physical from her hospitalization of December 23, 2019 and the patient was described as having a history of left-sided weakness with left-sided contraction and normally can use a walker to walk.  But she had woken up that morning with worsening of the left-sided weakness and the walker was of no help there were also difficulty speaking expressive aphasia was noted an MRI was negative for new stroke CT of the head was negative for any acute intracranial abnormalities as well.  A chest x-ray was obtained showing no abnormality of heart size or pulmonary function, a prior trauma to the right clavicular neck was known.  Urine was negative for UTI, coronavirus was negative tested, her admitting physician mentioned that she has CKD stage IIIa.   She ordered a PT and OT consultation continue Plavix and added again aspirin and Lipitor.  The MRI just redemonstrated previously known infarctions was in the posterior right frontal lobe and anterolateral left frontal lobe.  Left basal ganglia chronic blood products.  Associated exvacuo -Dilatation of the left lateral ventricle. he also mentioned chronic valerian degeneration of the pons.   The patient underwent a Mini-Mental status exam we have to adjust this for her handedness and inability with fine motor skills.  However she reached full orientation, repetition was normal she could do a serial 7 and 4 step steps inside of 5, remembered short-term memory 3 words named 3 objects followed a three-step command based on this there is no dementia.       07-03-2019 The patient is now 60 here with husband, who mentioned that she had likely some TIA in June 2020, followed by a phone conversation with our Stroke Team NP. The patient has been isolating but goes to church in person, her son is a Environmental education officer.  The patient has had a negative progression of left leg weakness, but not sudden- slowly, and related to low back pain. She has been given shots but these don't help much. Her speech remained slurred.  GDS 1/ 15 , no sleepiness core of fatigue score was obtained. All her meds are refilled through PCP she advised me. "Nothing new ". She still walks but watches out more, the leg may give out - Not painful.  Sleep is good now,      04-30-2018, I have the pleasure of meeting today with [facility] A. Aris Georgia, and her husband . Susan House has a history of progressive  small vessel disease, she was admitted to [facility] on 31 March 2018 and stayed overnight she was discharged on the 17th.Mr. Fabio House states that his wife had slurred speech and seemed to have worsening of left arm and leg weakness that was noted around 11 AM of the morning of 15 September.  She felt very sluggish went to  church continued to feel worse.  All of a sudden her left leg got very weak-  she was unable to lift it off the ground.  Also, these changes lasted only 24 hours- and she was not confused.    It appears that she presented with slurred speech in the setting of previous strokes, hypertension, hyperlipidemia and diabetes mellitus.  She underwent an MRI of the head which did not show any new stroke, no acute infarct or hemorrhage or mass-effect.  Old left frontal lobe infarct and multiple ganglia thalamic lacunar infarcts noted.  None of them fresh.  There is ex vacuo dilation of the left lateral ventricle and generalized age advanced atrophy of the brain with microvascular disease, she was seen by tele-neurology, the ED had Landry Mellow called a code stroke.  She also underwent an carotid studies and her right carotid bifurcation shows an estimated 50 to 70% proximal ICA stenosis velocity meet criteria for a greater than 70% stenosis though.  The interpreting radiologist Dr. Aletta Edouard, appreciated a stable appearance of moderate plaque at the right carotid bifurcation.  There was also a stable appearance of the plaque at the left carotid bifurcation here with less than 50% stenosis.  Blood chemistry showed nonfasting glucose level of 183, BUN 27, creatinine 1.12, sodium 141 potassium 3.9 hemoglobin 11.2 hematocrit 33.1.  Muscle strength in her left upper and lower extremities is reduced to 2 out of 5 historically but he has 5 out of 5 in her right extremities.  The slurred speech is still noted today also neither she nor her husband are sure if it is actually improved or if it was absolutely At the day of her presentation.  We do not have any evidence of a new stroke so at baseline this may have been a TIA or could have been an effect of a seizure?  She was not extremely hyperglycemic, her blood pressure was not out of control, she was not on any medications that confused her. She  Had Pt at home and she is not back  to baseline stated her husband- and she insists she is back to baseline. Her speech seems still very different from her baseline.   TIA - transient event over 24 hours or less,  5 days after colonoscopy and being for 6 days off Plavix- ,  history of left hemispheric stroke in 2000, hemiplegia work up  at [facility]-  .   New spell of left sided weakness- slurring of speech. Had diarrhea in the month of August- September and lost 25 pounds . She restarted on plavix and D/C  ASA 81 mg in March 2019.  She was taken off Plavix in preparation for colonoscopy- 03-26-2018, and resumed the medications on or after 03-28-2018.  History of stroke with enkephalomalacia. New MRI confirmed yet again no new stroke. Possible  TIA-    A) Uncontrolled blood pressures on multiple medication-  Has not had renal artery stenosis work up yet,she reports-  ECHO normal.carotid doppler to be reviewwd by STROKE MD. Has remote lacunar strokes.  Marland Kitchen  B) DM running high- and has been uncontrolled for years .  Sarahy Creedon Jaylani Mcguinn is seen on 10 October 2017, the patient is well established with our practice.  She was seen at [facility] on 04 October 2017 with evolving left-sided hemiparesis, was unable to stand and according to her husband felt weaker in her predisposed left side.  She has a history of removal remote lacunar infarcts in the internal capsule, basal ganglia has a small amount of chronic blood products.  The MRI MRA of the head did not reveal a new stroke and was compared to a prior CT from 03 October 2017 the day of admission.   All MRA related large vessels were patent.  She had some atheromatous irregularities in the flow of the MCA branches, ophthalmic arteries were normal, there is mild distal small vessel disease noted.  She had her first stroke about 19 years ago which initially left her with significant weakness in the left upper and lower extremities, she underwent  extensive rehab and subsequently her strength had improved.   However in the morning hours of 20 March she started having worsening weakness of the left upper and lower extremity as well as slurred speech.  After the MRI did not show an acute stroke the patient was worked up under the assumption that this was a TIA she underwent a transthoracic echocardiogram which showed an ejection fraction of 65-70%, normal left ventricle size, slightly calcified mitral valve, very mild stenosis almost trivial for the aortic valve, a moderately dilated left atrium there was no mentioning of pulmonary hypertension being found or not.  Risk factors were diabetes, hypertension, BMI of 44.7,  Susan House was placed back on Plavix,Plavix was discontinued after she had a GI bleed on 06-21-2016, colonoscopy confirmed ulceration 06-2016 .  She will continue amlodipine, Lipitor, Lomotil, insulin NovoLog, Lipitor Apresoline, hydrocodone for pain as needed prescribed by Dr. Rosanna Randy, Protonix, Ditropan, metoprolol, nitroglycerin as needed, metformin 1000 mg twice a day, and Keppra 500 mg tablets 2 tablets twice a day. I have the pleasure of seeing Susan House and her husband today on 04 July 2017, the patient had been evaluated in May for a possible seizure activity versus TIA.  Her EEG returned normal.  She had no more spells since.  Patient is no longer on baclofen, stroke prevention is provided by antiplatelet therapy with aspirin,She is pain-free, she reports no headaches, dizziness or palpitations.  She is walking with a  single point cane.  Today is 11/27/2016 and I have the pleasure of seeing Susan House in the presence of her husband, daughter and granddaughter. Susan House suffered a possible seizure episode on 10/15/2016. [name] daughter described which she had witnessed a day; the patient had fallen out of bed and onto a stool that was placed next to the bed she had very limitedrecollection of what happened and her  husband called to the emergency room services. Her daughter met her at the hospital and found her mother still very confused she had speech arrest. She tried to communicate by gesticulation. Was pantng, seemed in discomfort. VS were Ok. Patient remained restless and erratic, needing to nurses to observe her and basically help prevent injuries. Then within an hour or so the patient became suddenly fully aware of her surroundings. The patient had received nitroglycerin for chest pain, she had some aspirin after her stroke had been ruled out, nausea medication, but she was not given a seizure medicine or sedation.  The EEG during hospitalization was read by Dr. Alexis Goodell and  described a drowsy rhythm with rare left temporal sharp transients and phase reversal at the third temporal lead. Photic stimulation did not show any changes, hyperventilation was deferred. The finding was suggestive of a focal disturbance with epileptogenic potential.The patient was treated by Dr. Tressia Miners, and the discharge diagnosis on April 3 with acute encephalopathy with a new onset seizure, abnormal EEG, started on Keppra 500 mg twice a day to follow-up with neurology outpatient, no driving. She has taking it and feels slowed, thought process delay. Her family feels she is snappy and angry since starting the medication.  She takes narco for back pain. Other medications were baclofen, Lipitor, Flexeril, Lomotil, enalapril, gabapentin, magnesium oxide insulin did troponin, Protonix, nitroglycerin, Toprol. Plavix was discontinued after she had a GI bleed, colonoscopy confirmed ulceration 06-2016 . May re start after 6 month.  MM- 2017 [name] is a 64 year old female with a history of stroke with subsequent left hemiparesis. Her stroke occurred in 2000. She reports that she has continued on aspirin and Plavix. She is tolerating these medications well. Her blood pressure is slightly elevated today. Her primary care is managing her  blood pressure medications. Reports that her last hemoglobin A1c was 8.4. She is currently on Lipitor for her cholesterol. She denies any additional strokelike symptoms. She states that she does have difficulty walking long distances and for that reason she will use a wheelchair. She has very limited movement of the left arm. She has found baclofen to be beneficial for spasms. She also has a history of restless leg symptoms. Her primary care tried her on Requip however she did not find it beneficial. She is currently on gabapentin not specifically for restless leg symptoms. She states that her symptoms are tolerable but does at times interfere with her sleep. She returns today for an evaluation.  HISTORY (Dr. Edwena Felty note): Susan House is a 64 year old Caucasian female patient with significant left hemiparetic residual and left homonymous hemianopsia after her stroke she suffered in 2000. She has continued to take Plavix and aspirin and her risk factors have not changed significantly since our last visit. She has a history of his hypertension, migraine headaches, gastroesophageal reflux, diabetes, hyperlipidemia, obesity, irritable bowel syndrome, urinary incontinence, and was last admitted with a UTI to [facility] in [place]. Admission date was 12/26/2015 under the guidance of Dr. Fulton Reek. Primary doctor is Miguel Aschoff Junior discharge date was 12/28/2015. The patient was diagnosed with altered mental status due to urosepsis. She was treated with 3 antibiotics. Upon discharge the patient was told that she did not have an infection? She was diagnosed with systemic inflammatory response syndrome and encephalopathy. A CT of the head was obtained on the 11th of June, it showed no acute intracranial abnormalities, an MRI of the brain with and without contrast followed on 12/28/2015."   It appeared stable in comparison to a brain image from 2015 with chronic deep left  intracranial gliosis, postoperative changes to both globes after cataract surgery, some minor nasal sinus mucosal thickening. Chronic encephalomalacia in the left MCA territory with ex vacuo enlargement of the left frontal horn of the ventricle" she had that stroke in 2000.  The patient reports that on Saturday preceding her admission to the hospital she had worked on her parents state ready to dissolve the estate. She was at least 8 hours out on a very hot and sunny day working outside not sure that she hydrated properly developing a terrific headache she cannot remember  what happened that night from Saturday to Sunday and her husband reports that she was not able to sleep for a couple of nights. He accompanied her to the hospital. It is likely that she had a heat related accident. She missed a couple of medicine doses as well.    REVIEW OF SYSTEMS: Out of a complete 14 system review of symptoms, the patient complains only of the following symptoms, and all other reviewed systems are negative.   She was back to baseline after presumed TIA in 2018 but after 2019 remained with slurred speecch. . No seizure .  ALLERGIES: Allergies  Allergen Reactions  . Morphine And Related Anaphylaxis  . Reglan [Metoclopramide]     Other reaction(s): Unknown Elevated BP  . Simvastatin Other (See Comments)    Muscle pain  . Betadine [Povidone Iodine] Rash  . Tetracyclines & Related Rash    Other reaction(s): Unknown    HOME MEDICATIONS: Outpatient Medications Prior to Visit  Medication Sig Dispense Refill  . amLODipine (NORVASC) 10 MG tablet TAKE 1 TABLET BY MOUTH  DAILY 90 tablet 3  . amoxicillin-clavulanate (AUGMENTIN) 875-125 MG tablet Take 1 tablet by mouth 2 (two) times daily. 20 tablet 0  . aspirin 81 MG chewable tablet Chew 1 tablet (81 mg total) by mouth daily.    Marland Kitchen atorvastatin (LIPITOR) 20 MG tablet TAKE 1 TABLET BY MOUTH  DAILY (Patient taking differently: Take 20 mg by mouth at bedtime. ) 90  tablet 3  . ciprofloxacin (CIPRO) 250 MG tablet Take 1 tablet (250 mg total) by mouth 2 (two) times daily. 6 tablet 0  . clopidogrel (PLAVIX) 75 MG tablet TAKE 1 TABLET BY MOUTH  DAILY 90 tablet 1  . enalapril (VASOTEC) 20 MG tablet Take 1 tablet (20 mg total) by mouth 2 (two) times daily. 180 tablet 3  . gabapentin (NEURONTIN) 100 MG capsule Take 200 mg by mouth 2 (two) times daily.     . hydrALAZINE (APRESOLINE) 100 MG tablet Take 1 tablet (100 mg total) by mouth 3 (three) times daily. 180 tablet 2  . HYDROcodone-acetaminophen (NORCO) 5-325 MG tablet Take 2 tablets by mouth every 6 (six) hours as needed for moderate pain. 30 tablet 0  . insulin degludec (TRESIBA FLEXTOUCH) 100 UNIT/ML SOPN FlexTouch Pen ADMINISTER UP TO 30 UNITS UNDER THE SKIN DAILY 9 mL 0  . levETIRAcetam (KEPPRA) 500 MG tablet TAKE 1 TABLET BY MOUTH 2  TIMES DAILY 180 tablet 3  . metFORMIN (GLUCOPHAGE) 1000 MG tablet TAKE 1 TABLET BY MOUTH TWO  TIMES DAILY 180 tablet 1  . metoprolol succinate (TOPROL-XL) 100 MG 24 hr tablet TAKE 1 TABLET BY MOUTH  DAILY 90 tablet 3  . nitroGLYCERIN (NITROSTAT) 0.4 MG SL tablet Place 1 tablet (0.4 mg total) under the tongue every 5 (five) minutes as needed for chest pain. 25 tablet 0  . ondansetron (ZOFRAN) 4 MG tablet Take 1 tablet (4 mg total) by mouth every 8 (eight) hours as needed for nausea or vomiting. 60 tablet 5  . pantoprazole (PROTONIX) 40 MG tablet TAKE 1 TABLET BY MOUTH  DAILY 90 tablet 3   No facility-administered medications prior to visit.    PAST MEDICAL HISTORY: Past Medical History:  Diagnosis Date  . Allergy   . Arthritis   . Atypical chest pain    a. Reportedly nl cath in the past; b. 12/2009 MV: No ischemia/infarct. EF 87%.  . Carotid arterial disease ([place])    a. 10/1658 CTA head/neck: LICA 63K,  RICA 45-->med rx.  . Cellulitis and abscess of face   . Chronic Dizziness   . Coronary artery calcification seen on CT scan    a. 10/2016 CTA chest: Atherosclerotic  calcifications Ao, cors, and prox great vessels.  . Depression   . Diabetes mellitus ([place])   . Diastolic dysfunction    a. 2008 Echo: Nl EF; b. 2011 Echo: Nl EF; c. 06/2016 Echo: EF 55-60%, Gr1DD; d. 03/2018 Echo: EF 55-60%, no rwma, Gr1DD, mod dil LA.  . Edema   . GERD (gastroesophageal reflux disease)   . Headache   . Hematuria   . Hyperlipidemia   . Hypertension   . IBS (irritable bowel syndrome)   . Migraine   . Morbid obesity ([place])   . Nocturia   . Possible Seizure ([place])    a. 10/2016 ? sz. MRI neg for stroke.  . Stroke ([place]) 2000  . TIA (transient ischemic attack) 2010 & 2019   a. 2010; b. 03/2018 Left sided wkns-->MRI neg for stroke. CTA head/neck: Right A2 cut off/occlusion w/ collats extending over R cerebral convexity. LICA 29J, RICA 45, RSubclavian 50->med rx.  . Urgency of micturation   . Urinary frequency   . Urinary incontinence     PAST SURGICAL HISTORY: Past Surgical History:  Procedure Laterality Date  . ABDOMINAL HYSTERECTOMY    . ABDOMINOPLASTY    . APPENDECTOMY    . BACK SURGERY     extensive spinal fusion  . carpel tunnel surgery    . CATARACT EXTRACTION     x 2  . CESAREAN SECTION    . CHOLECYSTECTOMY    . COLONOSCOPY  2010  . COLONOSCOPY WITH PROPOFOL N/A 07/11/2016   Procedure: COLONOSCOPY WITH PROPOFOL;  Surgeon: Lucilla Lame, MD;  Location: [facility] ENDOSCOPY;  Service: Endoscopy;  Laterality: N/A;  . COLONOSCOPY WITH PROPOFOL N/A 03/26/2018   Procedure: COLONOSCOPY WITH PROPOFOL;  Surgeon: Lucilla Lame, MD;  Location[facility] ENDOSCOPY;  Service: Endoscopy;  Laterality: N/A;  . ESOPHAGOGASTRODUODENOSCOPY (EGD) WITH PROPOFOL N/A 03/26/2018   Procedure: ESOPHAGOGASTRODUODENOSCOPY (EGD) WITH PROPOFOL;  Surgeon: Lucilla Lame, MD;  Location: [facility] ENDOSCOPY;  Service: Endoscopy;  Laterality: N/A;  . EYE SURGERY    . HERNIA REPAIR    . SHOULDER SURGERY    . TOE SURGERY    . UPPER GI ENDOSCOPY    . WRIST FRACTURE SURGERY Left     FAMILY HISTORY: Family  History  Problem Relation Age of Onset  . Hyperlipidemia Mother   . Arrhythmia Mother        WPW  . Hypertension Mother   . Rheum arthritis Mother   . Heart disease Mother   . Heart attack Father 63  . Hyperlipidemia Father   . Hypertension Father   . Stroke Father   . Diabetes Father   . Heart disease Father   . Coronary artery disease Father   . Diabetes Sister   . Hyperlipidemia Sister   . Hypertension Sister   . Depression Sister   . Depression Sister   . Diabetes Sister   . Hypertension Sister   . Hyperlipidemia Sister   . Kidney disease Paternal Grandmother     SOCIAL HISTORY: Social History   Socioeconomic History  . Marital status: Married    Spouse name: Marcello Moores   . Number of children: 2  . Years of education: 71  . Highest education level: Not on file  Occupational History  . Occupation: Disabled   . Occupation: retired  Tobacco Use  .  Smoking status: Never Smoker  . Smokeless tobacco: Never Used  Vaping Use  . Vaping Use: Never used  Substance and Sexual Activity  . Alcohol use: No  . Drug use: No  . Sexual activity: Never  Other Topics Concern  . Not on file  Social History Narrative   Patient lives in [place] at home with husband Marcello Moores.    Patient has 2 children and 2 step children.    Patient has 12+ years of education.    Patient is Disabled since stroke in 2000.   Social Determinants of Health   Financial Resource Strain:   . Difficulty of Paying Living Expenses:   Food Insecurity:   . Worried About Charity fundraiser in the Last Year:   . Arboriculturist in the Last Year:   Transportation Needs:   . Film/video editor (Medical):   Marland Kitchen Lack of Transportation (Non-Medical):   Physical Activity:   . Days of Exercise per Week:   . Minutes of Exercise per Session:   Stress:   . Feeling of Stress :   Social Connections:   . Frequency of Communication with Friends and Family:   . Frequency of Social Gatherings with Friends and  Family:   . Attends Religious Services:   . Active Member of Clubs or Organizations:   . Attends Archivist Meetings:   Marland Kitchen Marital Status:   Intimate Partner Violence:   . Fear of Current or Ex-Partner:   . Emotionally Abused:   Marland Kitchen Physically Abused:   . Sexually Abused:       PHYSICAL EXAM  Vitals:   02/11/20 0820  BP: (!) 147/75  Pulse: 75  Weight: (!) 231 lb (104.8 kg)  Height: 5' 1"  (1.549 m)   Body mass index is 43.65 kg/m.  Generalized: Well developed, in no acute distress, well groomed. Cooperative    Neurological examination  Mentation: Alert oriented to time, place, history taking. She speaks haltering, there is now expressive aphasia that is more noticeable than before.  MMSE - Mini Mental State Exam 02/11/2020  Orientation to time 5  Orientation to Place 5  Registration 3  Attention/ Calculation 4  Recall 3  Language- name 2 objects 2  Language- repeat 1  Language- follow 3 step command 3  Language- read & follow direction 1  Write a sentence 1  Copy design 0  Total score 28     Cranial nerve :  No change in taste or smell,  Pupils were equal, status post cataract, she has diabetic retinopathy.    Extraocular movements were full, visual field were full on confrontational test. Facial sensation normal.facial asymmetry with drooling out of left angle of her mouth.  Uvula and tongue deviation . Head turning and shoulder shrug were intact .  Right shoulder is no longer lower. She yields to her left.  Motor: Limited movement in the left arm particularly in the elbow and hands. Left arm muscle bulk atrophy.  Spasticity over left extremities - DTR brisk.   Sensory: Sensory testing is intact to soft touch on all 4 extremities however slightly decreased on the left hand. Coordination: Cerebellar testing reveals good finger-nose-finger on the right, with mild dysmetria and pronator drift  difficulty on the left.  Reflexes: 2/2  Cannot rise from seated  position with bracing, 3 attempts. Now seated in a wheel chair. At home she uses a walker- she is in OT and PT.  Coordination and gait - everted  left foot, cannot bear weight, leaning onto cane.  Turning with assistance and with  8 steps. Stopped posture.    DIAGNOSTIC DATA (LABS, IMAGING, TESTING) - I reviewed patient records, labs, notes, testing and imaging myself where available. I reviewed the patient's 9-17 -2019 brain MRI and MRA,  Echocardiogram, carotid doppler.   Lab Results  Component Value Date   WBC 6.6 12/24/2019   HGB 11.1 (L) 12/24/2019   HCT 33.6 (L) 12/24/2019   MCV 86.2 12/24/2019   PLT 243 12/24/2019      Component Value Date/Time   NA 139 12/24/2019 0513   NA 140 01/23/2019 1442   NA 140 06/17/2014 0744   K 4.2 12/24/2019 0513   K 4.1 06/17/2014 0744   CL 109 12/24/2019 0513   CL 105 06/17/2014 0744   CO2 22 12/24/2019 0513   CO2 27 06/17/2014 0744   GLUCOSE 238 (H) 12/24/2019 0513   GLUCOSE 116 (H) 06/17/2014 0744   BUN 23 12/24/2019 0513   BUN 23 01/23/2019 1442   BUN 35 (H) 06/17/2014 0744   CREATININE 1.40 (H) 12/24/2019 0513   CREATININE 1.29 06/17/2014 0744   CALCIUM 8.0 (L) 12/24/2019 0513   CALCIUM 8.6 06/17/2014 0744   PROT 6.9 12/23/2019 1201   PROT 5.9 (L) 01/23/2019 1442   PROT 6.5 01/18/2014 0059   ALBUMIN 3.3 (L) 12/23/2019 1201   ALBUMIN 3.9 01/23/2019 1442   ALBUMIN 3.0 (L) 01/18/2014 0059   AST 26 12/23/2019 1201   AST 21 01/18/2014 0059   ALT 15 12/23/2019 1201   ALT 18 01/18/2014 0059   ALKPHOS 88 12/23/2019 1201   ALKPHOS 97 01/18/2014 0059   BILITOT 0.5 12/23/2019 1201   BILITOT 0.2 01/23/2019 1442   BILITOT 0.3 01/18/2014 0059   GFRNONAA 40 (L) 12/24/2019 0513   GFRNONAA 45 (L) 06/17/2014 0744   GFRNONAA 47 (L) 01/18/2014 0059   GFRAA 46 (L) 12/24/2019 0513   GFRAA 55 (L) 06/17/2014 0744   GFRAA 55 (L) 01/18/2014 0059   Lab Results  Component Value Date   CHOL 105 12/24/2019   HDL 35 (L) 12/24/2019   LDLCALC 41  12/24/2019   TRIG 144 12/24/2019   CHOLHDL 3.0 12/24/2019   Lab Results  Component Value Date   HGBA1C 9.8 (H) 12/24/2019   Lab Results  Component Value Date   VITAMINB12 551 01/15/2009   Lab Results  Component Value Date   TSH 1.320 12/25/2017    Poorly controlled DM -   ASSESSMENT AND PLAN 1) Stabilized Left hemiparesis post CVA- this  is in my opinion gradually changed with left grip weakness, worsening gait disorder, expressive aphasia- slowed and haltering speech, but good MMSE result. .   She needs to move - needs to walk- preferably indoors at a gym, [employer], or in a mall. I  Like her to resume water exercises once allowed. 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   2) she described  Vertigo - the room spins when she rises from a seated position- orthostatic dizziness. HTN 147/ 75 mmHg.   3) previously uncontrolled DM at HBA 1c 11(!) now better controlled-  She has assistance now to  take her medication regularly, pill box.   4) EEG abnormal in 2019, started on Keppra and remains on it- she had no spells since her medication intake has been observed.      The patient has brain atrophy, ex vacuo atrophy and asymmetry of ventricles. There is a history of one embolic stroke and  many,many lacunes in the basal gangliae. Small vessel disease and large vessel disease. Risk factors are the known underlying conditions of HTN and DM.  Her fall was related to a mechanical fall, she slipped on a textile she carried to the hamper- made her fall.   Rv in December 2021 with Stroke Service ,  NP, Venancio Poisson, alternating with me.  Patient has last seen NP VanSchaick during the pandemic in a phone visit.   Larey Seat, MD   02/11/2020, 8:33 AM [facility] [address], [place], [place] [address] [phone]

## 2020-02-11 NOTE — Patient Instructions (Signed)
Aphasia Aphasia is a language disorder that affects the part of your brain that you use to communicate. Aphasia does not affect your intelligence, but you may have trouble:  Speaking.  Understanding speech.  Reading.  Writing. Some people with aphasia may also have trouble with memory or attention. Aphasia can happen to anyone at any age, but it is most common in older adults. What are the causes? This condition is caused by damage to the language centers of the brain. Damage may be caused by:  Stroke. This causes a disruption of blood flow to certain areas of the brain. Stroke is the most common cause of aphasia.  Traumatic brain injury (TBI).  Brain tumor.  Infection of the brain tissues.  Nervous system disease that gradually gets worse (progressiveneurological disorder), such as dementia or multiple sclerosis (MS).  Brain surgery. What are the signs or symptoms? Symptoms of this condition include:  Trouble finding the right words.  Difficulty expressing thoughts and needs through speech.  Using the wrong words, nonsense words, or jargon.  Talking in sentences that do not make sense or that are not grammatically correct.  Being unable to repeat back words and phrases.  Difficulty expressing ideas through writing.  Difficulty comprehending when reading.  Being unable to understand other people's speech.  Having trouble understanding numbers. The condition affects people differently. Symptoms may start suddenly or come on gradually, depending on the underlying cause. How is this diagnosed? This condition may be diagnosed based on a screening of your ability to communicate as soon as symptoms start, or are medically stable after a stroke or brain injury. Later, a more comprehensive assessment may be conducted either in the hospital or rehabilitation center. The assessment may test your ability to:  Use speech to communicate your personal needs.  Use muscles in your  mouth and throat for speaking and swallowing.  Express ideas with speech or other means of communication, such as hand gestures.  Make conversation with others across a variety of topics.  Hear and understand speech.  Understand and produce written material.  Manage memory and attention associated with communication. How is this treated? Treatment for this condition depends on your needs and abilities. The goal is to help restore your ability to communicate or find ways to manage communication challenges. Common treatments include:  Speech-language therapy. Part of this may include: ? Re-building intonation, sentence structure, and vocabulary. ? Learning other ways to communicate, such as using word books, communication boards, or special software programs. ? Learning to communicate with writing, sign language, or hand gestures. ? Using a combination of methods to communicate.  Working with family members. This may include: ? Learning ways to communicate. ? Emotional support.  Support groups.  Occupational therapy. This can help to find devices to assist with daily living activities. Treatment usually begins as soon as possible. It may begin while you are in the hospital and continue in a rehabilitation center or at home. In some cases, aphasia may improve quickly on its own. In other cases, recovery occurs more slowly over time. Follow these instructions at home:   Keep all follow-up visits as told by your health care provider. This is important.  Make sure you have a good support system at home.  Find a support group. This can help you connect with others who are going through the same thing.  Try the following tips while communicating: ? Use short, simple sentences. Ask family members to do the same. Sentences that require   one-word or short answers are easiest. ? Avoid distractions like background noise when trying to listen or talk. ? Try communicating with gestures,  pointing, writing, or drawing. ? Talk slowly. Ask family members to talk to you slowly. ? Maintain eye contact when communicating. Ask family members to do the same when communicating with you. ? Ask family members to give you time to respond or to engage in conversation with them. Contact a health care provider if:  Your symptoms change or get worse.  You are struggling with anxiety or depression. Get help right away if you have:  Any symptoms of a stroke. "BE FAST" is an easy way to remember the main warning signs of a stroke: ? B - Balance. Signs are dizziness, sudden trouble walking, or loss of balance. ? E - Eyes. Signs are trouble seeing or a sudden change in vision. ? F - Face. Signs are a sudden weakness or numbness of the face, or the face or eyelid drooping on one side. ? A - Arms. Signs are weakness or numbness in an arm. This happens suddenly and usually on one side of the body. ? S - Speech. Signs are sudden trouble speaking, slurred speech, or trouble understanding what people say. ? T - Time. Time to call emergency service. Write down what time symptoms started.  Other signs of a stroke, such as: ? A sudden, severe headache with no known cause. ? Nausea or vomiting. ? Seizure. Summary  Aphasia is a language disorder that results from damage to the part of your brain that helps you use language to communicate. Aphasia does not affect your intelligence, but may cause difficulty with speech, writing, reading, or understanding others.  In some cases, aphasia may improve quickly on its own. In other cases, recovery occurs more slowly over time.  Get help right away if you have symptoms of a stroke. This information is not intended to replace advice given to you by your health care provider. Make sure you discuss any questions you have with your health care provider. Document Revised: 08/03/2017 Document Reviewed: 07/31/2017 Elsevier Patient Education  2020 [employer]vier Inc[employer]  

## 2020-02-12 DIAGNOSIS — Z9181 History of falling: Secondary | ICD-10-CM | POA: Diagnosis not present

## 2020-02-12 DIAGNOSIS — I2510 Atherosclerotic heart disease of native coronary artery without angina pectoris: Secondary | ICD-10-CM | POA: Diagnosis not present

## 2020-02-12 DIAGNOSIS — S92421D Displaced fracture of distal phalanx of right great toe, subsequent encounter for fracture with routine healing: Secondary | ICD-10-CM | POA: Diagnosis not present

## 2020-02-12 DIAGNOSIS — Z7902 Long term (current) use of antithrombotics/antiplatelets: Secondary | ICD-10-CM | POA: Diagnosis not present

## 2020-02-12 DIAGNOSIS — E785 Hyperlipidemia, unspecified: Secondary | ICD-10-CM | POA: Diagnosis not present

## 2020-02-12 DIAGNOSIS — S52612D Displaced fracture of left ulna styloid process, subsequent encounter for closed fracture with routine healing: Secondary | ICD-10-CM | POA: Diagnosis not present

## 2020-02-12 DIAGNOSIS — G43909 Migraine, unspecified, not intractable, without status migrainosus: Secondary | ICD-10-CM | POA: Diagnosis not present

## 2020-02-12 DIAGNOSIS — I119 Hypertensive heart disease without heart failure: Secondary | ICD-10-CM | POA: Diagnosis not present

## 2020-02-12 DIAGNOSIS — E1151 Type 2 diabetes mellitus with diabetic peripheral angiopathy without gangrene: Secondary | ICD-10-CM | POA: Diagnosis not present

## 2020-02-12 DIAGNOSIS — K219 Gastro-esophageal reflux disease without esophagitis: Secondary | ICD-10-CM | POA: Diagnosis not present

## 2020-02-12 DIAGNOSIS — Z7984 Long term (current) use of oral hypoglycemic drugs: Secondary | ICD-10-CM | POA: Diagnosis not present

## 2020-02-12 DIAGNOSIS — E1142 Type 2 diabetes mellitus with diabetic polyneuropathy: Secondary | ICD-10-CM | POA: Diagnosis not present

## 2020-02-12 DIAGNOSIS — Z8673 Personal history of transient ischemic attack (TIA), and cerebral infarction without residual deficits: Secondary | ICD-10-CM | POA: Diagnosis not present

## 2020-02-12 DIAGNOSIS — M1990 Unspecified osteoarthritis, unspecified site: Secondary | ICD-10-CM | POA: Diagnosis not present

## 2020-02-12 DIAGNOSIS — S2242XD Multiple fractures of ribs, left side, subsequent encounter for fracture with routine healing: Secondary | ICD-10-CM | POA: Diagnosis not present

## 2020-02-13 DIAGNOSIS — Z8673 Personal history of transient ischemic attack (TIA), and cerebral infarction without residual deficits: Secondary | ICD-10-CM | POA: Diagnosis not present

## 2020-02-13 DIAGNOSIS — S52612D Displaced fracture of left ulna styloid process, subsequent encounter for closed fracture with routine healing: Secondary | ICD-10-CM | POA: Diagnosis not present

## 2020-02-13 DIAGNOSIS — E1151 Type 2 diabetes mellitus with diabetic peripheral angiopathy without gangrene: Secondary | ICD-10-CM | POA: Diagnosis not present

## 2020-02-13 DIAGNOSIS — Z7902 Long term (current) use of antithrombotics/antiplatelets: Secondary | ICD-10-CM | POA: Diagnosis not present

## 2020-02-13 DIAGNOSIS — M1990 Unspecified osteoarthritis, unspecified site: Secondary | ICD-10-CM | POA: Diagnosis not present

## 2020-02-13 DIAGNOSIS — S92421D Displaced fracture of distal phalanx of right great toe, subsequent encounter for fracture with routine healing: Secondary | ICD-10-CM | POA: Diagnosis not present

## 2020-02-13 DIAGNOSIS — Z9181 History of falling: Secondary | ICD-10-CM | POA: Diagnosis not present

## 2020-02-13 DIAGNOSIS — Z7984 Long term (current) use of oral hypoglycemic drugs: Secondary | ICD-10-CM | POA: Diagnosis not present

## 2020-02-13 DIAGNOSIS — I119 Hypertensive heart disease without heart failure: Secondary | ICD-10-CM | POA: Diagnosis not present

## 2020-02-13 DIAGNOSIS — G43909 Migraine, unspecified, not intractable, without status migrainosus: Secondary | ICD-10-CM | POA: Diagnosis not present

## 2020-02-13 DIAGNOSIS — K219 Gastro-esophageal reflux disease without esophagitis: Secondary | ICD-10-CM | POA: Diagnosis not present

## 2020-02-13 DIAGNOSIS — E1142 Type 2 diabetes mellitus with diabetic polyneuropathy: Secondary | ICD-10-CM | POA: Diagnosis not present

## 2020-02-13 DIAGNOSIS — E785 Hyperlipidemia, unspecified: Secondary | ICD-10-CM | POA: Diagnosis not present

## 2020-02-13 DIAGNOSIS — I2510 Atherosclerotic heart disease of native coronary artery without angina pectoris: Secondary | ICD-10-CM | POA: Diagnosis not present

## 2020-02-13 DIAGNOSIS — S2242XD Multiple fractures of ribs, left side, subsequent encounter for fracture with routine healing: Secondary | ICD-10-CM | POA: Diagnosis not present

## 2020-02-16 ENCOUNTER — Encounter: Payer: Medicare Other | Admitting: Family Medicine

## 2020-02-16 ENCOUNTER — Other Ambulatory Visit: Payer: Self-pay

## 2020-02-16 DIAGNOSIS — S92421D Displaced fracture of distal phalanx of right great toe, subsequent encounter for fracture with routine healing: Secondary | ICD-10-CM | POA: Diagnosis not present

## 2020-02-16 DIAGNOSIS — Z7902 Long term (current) use of antithrombotics/antiplatelets: Secondary | ICD-10-CM | POA: Diagnosis not present

## 2020-02-16 DIAGNOSIS — E1151 Type 2 diabetes mellitus with diabetic peripheral angiopathy without gangrene: Secondary | ICD-10-CM | POA: Diagnosis not present

## 2020-02-16 DIAGNOSIS — S52612D Displaced fracture of left ulna styloid process, subsequent encounter for closed fracture with routine healing: Secondary | ICD-10-CM | POA: Diagnosis not present

## 2020-02-16 DIAGNOSIS — I119 Hypertensive heart disease without heart failure: Secondary | ICD-10-CM | POA: Diagnosis not present

## 2020-02-16 DIAGNOSIS — K219 Gastro-esophageal reflux disease without esophagitis: Secondary | ICD-10-CM | POA: Diagnosis not present

## 2020-02-16 DIAGNOSIS — I2510 Atherosclerotic heart disease of native coronary artery without angina pectoris: Secondary | ICD-10-CM | POA: Diagnosis not present

## 2020-02-16 DIAGNOSIS — G43909 Migraine, unspecified, not intractable, without status migrainosus: Secondary | ICD-10-CM | POA: Diagnosis not present

## 2020-02-16 DIAGNOSIS — S2242XD Multiple fractures of ribs, left side, subsequent encounter for fracture with routine healing: Secondary | ICD-10-CM | POA: Diagnosis not present

## 2020-02-16 DIAGNOSIS — F329 Major depressive disorder, single episode, unspecified: Secondary | ICD-10-CM

## 2020-02-16 DIAGNOSIS — Z7984 Long term (current) use of oral hypoglycemic drugs: Secondary | ICD-10-CM | POA: Diagnosis not present

## 2020-02-16 DIAGNOSIS — M1990 Unspecified osteoarthritis, unspecified site: Secondary | ICD-10-CM | POA: Diagnosis not present

## 2020-02-16 DIAGNOSIS — Z8673 Personal history of transient ischemic attack (TIA), and cerebral infarction without residual deficits: Secondary | ICD-10-CM | POA: Diagnosis not present

## 2020-02-16 DIAGNOSIS — E1142 Type 2 diabetes mellitus with diabetic polyneuropathy: Secondary | ICD-10-CM | POA: Diagnosis not present

## 2020-02-16 DIAGNOSIS — E785 Hyperlipidemia, unspecified: Secondary | ICD-10-CM | POA: Diagnosis not present

## 2020-02-16 DIAGNOSIS — Z9181 History of falling: Secondary | ICD-10-CM | POA: Diagnosis not present

## 2020-02-18 ENCOUNTER — Encounter: Payer: Self-pay | Admitting: Family Medicine

## 2020-02-18 ENCOUNTER — Ambulatory Visit (INDEPENDENT_AMBULATORY_CARE_PROVIDER_SITE_OTHER): Payer: Medicare Other | Admitting: Family Medicine

## 2020-02-18 ENCOUNTER — Other Ambulatory Visit: Payer: Self-pay

## 2020-02-18 VITALS — BP 98/56 | HR 95 | Temp 97.5°F

## 2020-02-18 DIAGNOSIS — N390 Urinary tract infection, site not specified: Secondary | ICD-10-CM

## 2020-02-18 NOTE — Progress Notes (Addendum)
Established patient visit   Patient: Susan House   DOB: 1956-05-01   64 y.o. Female  MRN: [id number] Visit Date: 02/18/2020  Today's healthcare provider: Wilhemena Durie, MD   No chief complaint on file.  Subjective    HPI   [name]" [name] presents today for follow up of T2DM, Hypertension, and mobility, for which more details follow. Susan House also reports back pain and pain around urination. Back pain (8/10 today) is localized to above the "crack" and has been present for 3-4 weeks. The patient was given bandages by PT, but has not had any relief of the pain. To decrease pain she will lean to the side within her chair.  Susan House also reports pain before and at the end of urination. She has no pain during urination, no hematuria, no fevers or chills, and is not able to distinguish additional back pain from her every day pain.  Patient progressively getting weaker and not responding to physical therapy.  She is requiring more more care by family members. Diabetes Mellitus Type II, Follow-up  Lab Results  Component Value Date   HGBA1C 9.8 (H) 12/24/2019   HGBA1C 10.0 (H) 11/10/2019   HGBA1C 9.4 (A) 05/28/2019   Wt Readings from Last 3 Encounters:  02/11/20 (!) 231 lb (104.8 kg)  12/25/19 176 lb 5.9 oz (80 kg)  11/10/19 229 lb 11.5 oz (104.2 kg)   Last seen for diabetes 3 months ago.  She has had acute visits since that time.  Management since then includes no medication changes, referral was made for home health.  Pertinent Labs: Lab Results  Component Value Date   CHOL 105 12/24/2019   HDL 35 (L) 12/24/2019   LDLCALC 41 12/24/2019   TRIG 144 12/24/2019   CHOLHDL 3.0 12/24/2019   Lab Results  Component Value Date   NA 139 12/24/2019   K 4.2 12/24/2019   CREATININE 1.40 (H) 12/24/2019   GFRNONAA 40 (L) 12/24/2019   GFRAA 46 (L) 12/24/2019   GLUCOSE 238 (H) 12/24/2019      ---------------------------------------------------------------------------------------------------  Hypertension, follow-up  BP Readings from Last 3 Encounters:  02/18/20 (!) 98/56  02/11/20 (!) 147/75  01/13/20 125/70   Wt Readings from Last 3 Encounters:  02/11/20 (!) 231 lb (104.8 kg)  12/25/19 176 lb 5.9 oz (80 kg)  11/10/19 229 lb 11.5 oz (104.2 kg)     She was last seen for hypertension 3 months ago.  Management since that visit includes no medication changes, referral was placed  Pertinent labs: Lab Results  Component Value Date   CHOL 105 12/24/2019   HDL 35 (L) 12/24/2019   LDLCALC 41 12/24/2019   TRIG 144 12/24/2019   CHOLHDL 3.0 12/24/2019   Lab Results  Component Value Date   NA 139 12/24/2019   K 4.2 12/24/2019   CREATININE 1.40 (H) 12/24/2019   GFRNONAA 40 (L) 12/24/2019   GFRAA 46 (L) 12/24/2019   GLUCOSE 238 (H) 12/24/2019     The ASCVD Risk score Susan House., et al., 2013) failed to calculate for the following reasons:   The patient has a prior MI or stroke diagnosis   --------------------------------------------------------------------------------------------------- Mobility Patient has had increasing difficulty with her mobility resulting in numerous falls and fractures.  Home health referral was made during last visit to help aid in this area.   Per the patients daughter, the PT is going to end at the end of this month due to lack of progression.  Per the patients daughter, she cannot walk more than 10 steps. Their recommendation is that the patient go to rehab and get daily PT at a rehabilitation facility. The patient however, is opposed to this recommendation. One of her concerns is her husband's care while she is away.   Medications: Outpatient Medications Prior to Visit  Medication Sig  . amLODipine (NORVASC) 10 MG tablet TAKE 1 TABLET BY MOUTH  DAILY  . aspirin 81 MG chewable tablet Chew 1 tablet (81 mg total) by mouth daily.  Marland Kitchen  atorvastatin (LIPITOR) 20 MG tablet TAKE 1 TABLET BY MOUTH  DAILY (Patient taking differently: Take 20 mg by mouth at bedtime. )  . clopidogrel (PLAVIX) 75 MG tablet TAKE 1 TABLET BY MOUTH  DAILY  . enalapril (VASOTEC) 20 MG tablet Take 1 tablet (20 mg total) by mouth 2 (two) times daily.  Marland Kitchen gabapentin (NEURONTIN) 100 MG capsule Take 200 mg by mouth 2 (two) times daily.   . hydrALAZINE (APRESOLINE) 100 MG tablet Take 1 tablet (100 mg total) by mouth 3 (three) times daily.  Marland Kitchen HYDROcodone-acetaminophen (NORCO) 5-325 MG tablet Take 2 tablets by mouth every 6 (six) hours as needed for moderate pain.  Marland Kitchen insulin degludec (TRESIBA FLEXTOUCH) 100 UNIT/ML SOPN FlexTouch Pen ADMINISTER UP TO 30 UNITS UNDER THE SKIN DAILY  . levETIRAcetam (KEPPRA) 500 MG tablet TAKE 1 TABLET BY MOUTH 2  TIMES DAILY  . metFORMIN (GLUCOPHAGE) 1000 MG tablet TAKE 1 TABLET BY MOUTH TWO  TIMES DAILY  . metoprolol succinate (TOPROL-XL) 100 MG 24 hr tablet TAKE 1 TABLET BY MOUTH  DAILY  . nitroGLYCERIN (NITROSTAT) 0.4 MG SL tablet Place 1 tablet (0.4 mg total) under the tongue every 5 (five) minutes as needed for chest pain.  Marland Kitchen ondansetron (ZOFRAN) 4 MG tablet Take 1 tablet (4 mg total) by mouth every 8 (eight) hours as needed for nausea or vomiting.  . pantoprazole (PROTONIX) 40 MG tablet TAKE 1 TABLET BY MOUTH  DAILY  . [DISCONTINUED] amoxicillin-clavulanate (AUGMENTIN) 875-125 MG tablet Take 1 tablet by mouth 2 (two) times daily.  . [DISCONTINUED] ciprofloxacin (CIPRO) 250 MG tablet Take 1 tablet (250 mg total) by mouth 2 (two) times daily.   No facility-administered medications prior to visit.    Review of Systems  Constitutional: Negative for fatigue and fever.  Respiratory: Negative for cough and shortness of breath.   Cardiovascular: Negative for chest pain, palpitations and leg swelling.  Genitourinary: Positive for dysuria. Negative for hematuria.  Musculoskeletal: Positive for back pain.  Neurological: Positive for  weakness.      Objective    BP (!) 98/56 (BP Location: Right Arm, Patient Position: Sitting, Cuff Size: Normal)   Pulse 95   Temp (!) 97.5 F (36.4 C) (Oral)   SpO2 99%    Physical Exam Constitutional:      General: She is not in acute distress.    Appearance: She is obese. She is not toxic-appearing.  HENT:     Head: Normocephalic and atraumatic.     Right Ear: External ear normal.     Left Ear: External ear normal.     Nose: Nose normal.     Mouth/Throat:     Mouth: Mucous membranes are moist.  Eyes:     General: No scleral icterus.    Conjunctiva/sclera: Conjunctivae normal.  Cardiovascular:     Rate and Rhythm: Normal rate.     Heart sounds: Normal heart sounds.  Pulmonary:     Effort: Pulmonary effort is  normal. No respiratory distress.     Breath sounds: Normal breath sounds.  Abdominal:     Palpations: Abdomen is soft.  Musculoskeletal:     Cervical back: Normal range of motion.  Skin:    Coloration: Skin is not jaundiced.     Findings: Wound present.     Comments: Stage 1 pressure ulcer at top of intergluteal cleft  Neurological:     Mental Status: She is alert and oriented to person, place, and time.     Motor: Weakness present.     Comments: Chronic left hemiparesis. Patient is in a wheelchair.  Psychiatric:        Attention and Perception: Attention normal.        Mood and Affect: Mood is depressed. Affect is tearful.        Speech: Speech is delayed.        Behavior: Behavior normal. Behavior is cooperative.   Unable to give urine sample in-office today  Patient declined PHQ-9  MMSE at neurologist 28/30 on 02/11/20  No results found for any visits on 02/18/20.  Assessment & Plan    1. Urinary tract infection without hematuria, site unspecified - Collect urine sample at home, return to office for assessment  2. Pressure ulcer, stage 1 - Healing  - Be up and moving as much as possible, keeping off of ulcer  3. Alteration in mobility as late  effect of cerebrovascular accident (CVA) - Progressive difficulties in mobility, challenges getting up to the restroom - Continue home PT through end of month - Strongly recommend 3-4 week rehabilitation program, concerned she is headed towards assisted living or long term care facility if she does not improve - We will complete disability paperwork  4. Cerebrovascular accident (CVA) due to occlusion of left middle cerebral artery ([place])  5. Type 2 diabetes mellitus with stage 3a chronic kidney disease, with long-term current use of insulin (HCC) - Continue current regimen, follow up in September for A1c - Consider endocrinology for continuous glucose monitoring/insulin pump  6.  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 Restless legs syndrome (RLS)  8. Poor mobility/high fall risk  9. Class 3 severe obesity due to excess calories with serious comorbidity and body mass index (BMI) of 40.0 to 44.9 in adult ([place])  10. 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 MCI (mild cognitive impairment) MMSE 28/30 on 02/11/20 at neurologist   No follow-ups on file. Follow-up in 3-4 weeks.     Rodrigo Ran, MS3  Richard Cranford Mon, MD  [facility] [phone] (phone) [phone] (fax)  [place]

## 2020-02-18 NOTE — Progress Notes (Deleted)
Established patient visit   Patient: Susan House   DOB: March 21, 1956   64 y.o. Female  MRN: [id number] Visit Date: 02/18/2020  Today's healthcare provider: Wilhemena Durie, MD   No chief complaint on file.  Subjective    HPI   Diabetes Mellitus Type II, Follow-up  Lab Results  Component Value Date   HGBA1C 9.8 (H) 12/24/2019   HGBA1C 10.0 (H) 11/10/2019   HGBA1C 9.4 (A) 05/28/2019   Wt Readings from Last 3 Encounters:  02/11/20 (!) 231 lb (104.8 kg)  12/25/19 176 lb 5.9 oz (80 kg)  11/10/19 229 lb 11.5 oz (104.2 kg)   Last seen for diabetes 3 months ago.  She has had acute visits since that time.  Management since then includes no medication changes, referral was made for home health.  Pertinent Labs: Lab Results  Component Value Date   CHOL 105 12/24/2019   HDL 35 (L) 12/24/2019   LDLCALC 41 12/24/2019   TRIG 144 12/24/2019   CHOLHDL 3.0 12/24/2019   Lab Results  Component Value Date   NA 139 12/24/2019   K 4.2 12/24/2019   CREATININE 1.40 (H) 12/24/2019   GFRNONAA 40 (L) 12/24/2019   GFRAA 46 (L) 12/24/2019   GLUCOSE 238 (H) 12/24/2019     ---------------------------------------------------------------------------------------------------  Hypertension, follow-up  BP Readings from Last 3 Encounters:  02/18/20 (!) 98/56  02/11/20 (!) 147/75  01/13/20 125/70   Wt Readings from Last 3 Encounters:  02/11/20 (!) 231 lb (104.8 kg)  12/25/19 176 lb 5.9 oz (80 kg)  11/10/19 229 lb 11.5 oz (104.2 kg)     She was last seen for hypertension 3 months ago.  Management since that visit includes no medication changes, referral was placed  Pertinent labs: Lab Results  Component Value Date   CHOL 105 12/24/2019   HDL 35 (L) 12/24/2019   LDLCALC 41 12/24/2019   TRIG 144 12/24/2019   CHOLHDL 3.0 12/24/2019   Lab Results  Component Value Date   NA 139 12/24/2019   K 4.2 12/24/2019   CREATININE 1.40 (H) 12/24/2019   GFRNONAA 40 (L) 12/24/2019     GFRAA 46 (L) 12/24/2019   GLUCOSE 238 (H) 12/24/2019     The ASCVD Risk score Mikey Bussing DC Jr., et al., 2013) failed to calculate for the following reasons:   The patient has a prior MI or stroke diagnosis   --------------------------------------------------------------------------------------------------- Mobility Patient has had increasing difficulty with her mobility resulting in numerous falls and fractures.  Home health referral was made during last visit to help aid in this area.   Per the patients daughter, the PT is going to end due to lack of progression.  Their recommendation is that the patient go to rehab and get daily PT.  The patient however, is opposed to this recommendation.   Medications: Outpatient Medications Prior to Visit  Medication Sig  . amLODipine (NORVASC) 10 MG tablet TAKE 1 TABLET BY MOUTH  DAILY  . aspirin 81 MG chewable tablet Chew 1 tablet (81 mg total) by mouth daily.  Marland Kitchen atorvastatin (LIPITOR) 20 MG tablet TAKE 1 TABLET BY MOUTH  DAILY (Patient taking differently: Take 20 mg by mouth at bedtime. )  . clopidogrel (PLAVIX) 75 MG tablet TAKE 1 TABLET BY MOUTH  DAILY  . enalapril (VASOTEC) 20 MG tablet Take 1 tablet (20 mg total) by mouth 2 (two) times daily.  Marland Kitchen gabapentin (NEURONTIN) 100 MG capsule Take 200 mg by mouth 2 (two)  times daily.   . hydrALAZINE (APRESOLINE) 100 MG tablet Take 1 tablet (100 mg total) by mouth 3 (three) times daily.  Marland Kitchen HYDROcodone-acetaminophen (NORCO) 5-325 MG tablet Take 2 tablets by mouth every 6 (six) hours as needed for moderate pain.  Marland Kitchen insulin degludec (TRESIBA FLEXTOUCH) 100 UNIT/ML SOPN FlexTouch Pen ADMINISTER UP TO 30 UNITS UNDER THE SKIN DAILY  . levETIRAcetam (KEPPRA) 500 MG tablet TAKE 1 TABLET BY MOUTH 2  TIMES DAILY  . metFORMIN (GLUCOPHAGE) 1000 MG tablet TAKE 1 TABLET BY MOUTH TWO  TIMES DAILY  . metoprolol succinate (TOPROL-XL) 100 MG 24 hr tablet TAKE 1 TABLET BY MOUTH  DAILY  . nitroGLYCERIN (NITROSTAT) 0.4 MG SL  tablet Place 1 tablet (0.4 mg total) under the tongue every 5 (five) minutes as needed for chest pain.  Marland Kitchen ondansetron (ZOFRAN) 4 MG tablet Take 1 tablet (4 mg total) by mouth every 8 (eight) hours as needed for nausea or vomiting.  . pantoprazole (PROTONIX) 40 MG tablet TAKE 1 TABLET BY MOUTH  DAILY  . [DISCONTINUED] amoxicillin-clavulanate (AUGMENTIN) 875-125 MG tablet Take 1 tablet by mouth 2 (two) times daily.  . [DISCONTINUED] ciprofloxacin (CIPRO) 250 MG tablet Take 1 tablet (250 mg total) by mouth 2 (two) times daily.   No facility-administered medications prior to visit.    Review of Systems  Constitutional: Negative for fatigue and fever.  Respiratory: Negative for cough and shortness of breath.   Cardiovascular: Negative for chest pain, palpitations and leg swelling.  Neurological: Positive for weakness.    {Heme  Chem  Endocrine  Serology  Results Review (optional):23779::" "}  Objective    BP (!) 98/56 (BP Location: Right Arm, Patient Position: Sitting, Cuff Size: Normal)   Pulse 95   Temp (!) 97.5 F (36.4 C) (Oral)   SpO2 99%  {Show previous vital signs (optional):23777::" "}  Physical Exam  ***  No results found for any visits on 02/18/20.  Assessment & Plan     ***  No follow-ups on file.      {provider attestation***:1}   Wilhemena Durie, MD  [facility] [phone] (phone) [phone] (fax)  [place]

## 2020-02-19 DIAGNOSIS — E1151 Type 2 diabetes mellitus with diabetic peripheral angiopathy without gangrene: Secondary | ICD-10-CM | POA: Diagnosis not present

## 2020-02-19 DIAGNOSIS — E1142 Type 2 diabetes mellitus with diabetic polyneuropathy: Secondary | ICD-10-CM | POA: Diagnosis not present

## 2020-02-19 DIAGNOSIS — I2510 Atherosclerotic heart disease of native coronary artery without angina pectoris: Secondary | ICD-10-CM | POA: Diagnosis not present

## 2020-02-19 DIAGNOSIS — Z7984 Long term (current) use of oral hypoglycemic drugs: Secondary | ICD-10-CM | POA: Diagnosis not present

## 2020-02-19 DIAGNOSIS — S92421D Displaced fracture of distal phalanx of right great toe, subsequent encounter for fracture with routine healing: Secondary | ICD-10-CM | POA: Diagnosis not present

## 2020-02-19 DIAGNOSIS — E785 Hyperlipidemia, unspecified: Secondary | ICD-10-CM | POA: Diagnosis not present

## 2020-02-19 DIAGNOSIS — Z8673 Personal history of transient ischemic attack (TIA), and cerebral infarction without residual deficits: Secondary | ICD-10-CM | POA: Diagnosis not present

## 2020-02-19 DIAGNOSIS — K219 Gastro-esophageal reflux disease without esophagitis: Secondary | ICD-10-CM | POA: Diagnosis not present

## 2020-02-19 DIAGNOSIS — S2242XD Multiple fractures of ribs, left side, subsequent encounter for fracture with routine healing: Secondary | ICD-10-CM | POA: Diagnosis not present

## 2020-02-19 DIAGNOSIS — M1990 Unspecified osteoarthritis, unspecified site: Secondary | ICD-10-CM | POA: Diagnosis not present

## 2020-02-19 DIAGNOSIS — I119 Hypertensive heart disease without heart failure: Secondary | ICD-10-CM | POA: Diagnosis not present

## 2020-02-19 DIAGNOSIS — Z7902 Long term (current) use of antithrombotics/antiplatelets: Secondary | ICD-10-CM | POA: Diagnosis not present

## 2020-02-19 DIAGNOSIS — G43909 Migraine, unspecified, not intractable, without status migrainosus: Secondary | ICD-10-CM | POA: Diagnosis not present

## 2020-02-19 DIAGNOSIS — S52612D Displaced fracture of left ulna styloid process, subsequent encounter for closed fracture with routine healing: Secondary | ICD-10-CM | POA: Diagnosis not present

## 2020-02-19 DIAGNOSIS — Z9181 History of falling: Secondary | ICD-10-CM | POA: Diagnosis not present

## 2020-02-21 ENCOUNTER — Other Ambulatory Visit: Payer: Self-pay | Admitting: Family Medicine

## 2020-02-21 DIAGNOSIS — K219 Gastro-esophageal reflux disease without esophagitis: Secondary | ICD-10-CM

## 2020-02-22 ENCOUNTER — Encounter: Payer: Self-pay | Admitting: Family Medicine

## 2020-02-24 ENCOUNTER — Telehealth: Payer: Self-pay

## 2020-02-24 ENCOUNTER — Telehealth (INDEPENDENT_AMBULATORY_CARE_PROVIDER_SITE_OTHER): Payer: Medicare Other | Admitting: Family Medicine

## 2020-02-24 DIAGNOSIS — J069 Acute upper respiratory infection, unspecified: Secondary | ICD-10-CM

## 2020-02-24 MED ORDER — AZITHROMYCIN 250 MG PO TABS: ORAL_TABLET | ORAL | 0 refills | Status: DC

## 2020-02-24 NOTE — Progress Notes (Signed)
Unable to contact patient's.  Antibiotics have been called in earlier.  No visit on today's date.

## 2020-02-24 NOTE — Telephone Encounter (Signed)
Medication sent to patient's pharmacy.

## 2020-02-27 ENCOUNTER — Ambulatory Visit: Payer: Self-pay | Admitting: *Deleted

## 2020-02-27 NOTE — Telephone Encounter (Signed)
° °  Reason for Disposition  [1] MODERATE difficulty breathing (e.g., speaks in phrases, SOB even at rest, pulse 100-120) AND [2] NEW-onset or WORSE than normal  Answer Assessment - Initial Assessment Questions 1. RESPIRATORY STATUS: "Describe your breathing?" (e.g., wheezing, shortness of breath, unable to speak, severe coughing)      Raspy, SOB 2. ONSET: "When did this breathing problem begin?"      Last 24 hours 3. PATTERN "Does the difficult breathing come and go, or has it been constant since it started?"      constant 4. SEVERITY: "How bad is your breathing?" (e.g., mild, moderate, severe)    - MILD: No SOB at rest, mild SOB with walking, speaks normally in sentences, can lay down, no retractions, pulse < 100.    - MODERATE: SOB at rest, SOB with minimal exertion and prefers to sit, cannot lie down flat, speaks in phrases, mild retractions, audible wheezing, pulse 100-120.    - SEVERE: Very SOB at rest, speaks in single words, struggling to breathe, sitting hunched forward, retractions, pulse > 120      moderate 5. RECURRENT SYMPTOM: "Have you had difficulty breathing before?" If Yes, ask: "When was the last time?" and "What happened that time?"      It has been a long time 6. CARDIAC HISTORY: "Do you have any history of heart disease?" (e.g., heart attack, angina, bypass surgery, angioplasty)      no 7. LUNG HISTORY: "Do you have any history of lung disease?"  (e.g., pulmonary embolus, asthma, emphysema)     Years ago- asthma 8. CAUSE: "What do you think is causing the breathing problem?"      Cough and congestion 9. OTHER SYMPTOMS: "Do you have any other symptoms? (e.g., dizziness, runny nose, cough, chest pain, fever)     Cough,congestion 10. PREGNANCY: "Is there any chance you are pregnant?" "When was your last menstrual period?"       n/a 11. TRAVEL: "Have you traveled out of the country in the last month?" (e.g., travel history, exposures)       n/a  Protocols used:  BREATHING DIFFICULTY-A-AH

## 2020-03-02 ENCOUNTER — Telehealth: Payer: Self-pay

## 2020-03-02 NOTE — Telephone Encounter (Unsigned)
Copied from [place] [id number]. Topic: General - Other >> Mar 02, 2020  3:28 PM Sheran Luz wrote: Elmo Putt, with [facility], calling to state that patient has missed x3 appointments due to unknown illness which Elmo Putt states sounds like covid symptoms. She inquired if there were instructions to give to the family (if she needs to get tested, needs a visit) and also wanted to note missed visits.

## 2020-03-03 ENCOUNTER — Telehealth: Payer: Self-pay | Admitting: Cardiovascular Disease

## 2020-03-03 NOTE — Telephone Encounter (Signed)
Please check to see if patient needs to get tested for Covid?

## 2020-03-03 NOTE — Telephone Encounter (Signed)
Please call patient and check with her with what is going on.  She has become a very poor historian in the past few years so I can get a story for what is going on that would be great.  Thank you.  Also, has she been vaccinated

## 2020-03-03 NOTE — Telephone Encounter (Signed)
Called patient back. Patient stated she has had her covid vaccines but did not give dates (even when asked). Patient also stated she does not have any symptoms, of any kind. Patient also denied having any vomiting yesterday. When asked twice about missed PT appointments, patient did not acknowledge the question.

## 2020-03-03 NOTE — Telephone Encounter (Signed)
FYI! Patient states she is not having any symptoms and she was tested for covid 2 weeks ago with negative result.

## 2020-03-03 NOTE — Telephone Encounter (Addendum)
Caller name: Elmo Putt  Relation to pt: PT for [facility] Call back number: [phone]  Reason for call:  PT would like a follow up call today regarding patient cancelling her PT visits for the past 2 weeks due to the following symptoms mentioned below. PT states appointment was cancelled yesterday due to patient vomitting. Physical Therapist appointment [facility] to 8/19. PT would like to speak with PCP nurse today

## 2020-03-03 NOTE — Telephone Encounter (Signed)
Reviewed with patient that appointment was canceled and I wanted to see if she would like to reschedule. She was agreeable and scheduled her for virtual visit 03/24/20 with Dr. Rockey Situ. Reviewed that she would receive call prior to appointment to review her medications and additional information and then Dr. Rockey Situ would call. She verbalized understanding of our conversation, agreement with plan, and had no further questions at this time.

## 2020-03-04 ENCOUNTER — Ambulatory Visit: Payer: Self-pay | Admitting: Family Medicine

## 2020-03-04 ENCOUNTER — Other Ambulatory Visit: Payer: Self-pay

## 2020-03-04 ENCOUNTER — Encounter: Payer: Self-pay | Admitting: Physician Assistant

## 2020-03-04 ENCOUNTER — Ambulatory Visit (INDEPENDENT_AMBULATORY_CARE_PROVIDER_SITE_OTHER): Payer: Medicare Other | Admitting: Physician Assistant

## 2020-03-04 VITALS — BP 92/54 | HR 71 | Temp 98.5°F

## 2020-03-04 DIAGNOSIS — E785 Hyperlipidemia, unspecified: Secondary | ICD-10-CM | POA: Diagnosis not present

## 2020-03-04 DIAGNOSIS — L89159 Pressure ulcer of sacral region, unspecified stage: Secondary | ICD-10-CM

## 2020-03-04 DIAGNOSIS — S2242XD Multiple fractures of ribs, left side, subsequent encounter for fracture with routine healing: Secondary | ICD-10-CM | POA: Diagnosis not present

## 2020-03-04 DIAGNOSIS — G43909 Migraine, unspecified, not intractable, without status migrainosus: Secondary | ICD-10-CM | POA: Diagnosis not present

## 2020-03-04 DIAGNOSIS — K219 Gastro-esophageal reflux disease without esophagitis: Secondary | ICD-10-CM | POA: Diagnosis not present

## 2020-03-04 DIAGNOSIS — Z8673 Personal history of transient ischemic attack (TIA), and cerebral infarction without residual deficits: Secondary | ICD-10-CM | POA: Diagnosis not present

## 2020-03-04 DIAGNOSIS — E1142 Type 2 diabetes mellitus with diabetic polyneuropathy: Secondary | ICD-10-CM | POA: Diagnosis not present

## 2020-03-04 DIAGNOSIS — Z7902 Long term (current) use of antithrombotics/antiplatelets: Secondary | ICD-10-CM | POA: Diagnosis not present

## 2020-03-04 DIAGNOSIS — Z7984 Long term (current) use of oral hypoglycemic drugs: Secondary | ICD-10-CM | POA: Diagnosis not present

## 2020-03-04 DIAGNOSIS — S52612D Displaced fracture of left ulna styloid process, subsequent encounter for closed fracture with routine healing: Secondary | ICD-10-CM | POA: Diagnosis not present

## 2020-03-04 DIAGNOSIS — I119 Hypertensive heart disease without heart failure: Secondary | ICD-10-CM | POA: Diagnosis not present

## 2020-03-04 DIAGNOSIS — Z9181 History of falling: Secondary | ICD-10-CM | POA: Diagnosis not present

## 2020-03-04 DIAGNOSIS — S92421D Displaced fracture of distal phalanx of right great toe, subsequent encounter for fracture with routine healing: Secondary | ICD-10-CM | POA: Diagnosis not present

## 2020-03-04 DIAGNOSIS — E1151 Type 2 diabetes mellitus with diabetic peripheral angiopathy without gangrene: Secondary | ICD-10-CM | POA: Diagnosis not present

## 2020-03-04 DIAGNOSIS — M1990 Unspecified osteoarthritis, unspecified site: Secondary | ICD-10-CM | POA: Diagnosis not present

## 2020-03-04 DIAGNOSIS — I2510 Atherosclerotic heart disease of native coronary artery without angina pectoris: Secondary | ICD-10-CM | POA: Diagnosis not present

## 2020-03-04 MED ORDER — CAVILON DURABLE BARRIER 1.3 % EX CREA: TOPICAL_CREAM | CUTANEOUS | 0 refills | Status: DC

## 2020-03-04 NOTE — Telephone Encounter (Signed)
Pt. Reports she started having pain at her tailbone 1 week ago. No injury. Reports skin is red at the area. No open skin. Reports "Dr. Rosanna Randy prescribed a cream for me that helped." Request that it be sent to her pharmacy if possible. Offered appointment. Pt. Declines. Please advise pt.  MT 4  Susan House "ANGELA" Female, 64 y.o., [birth year] MRN:  [id number] Phone:  [phone] Susan House) PCP:  Jerrol Banana., MD Primary Cvg:  [place] [facility] 03/24/2020 at 9:40 AM Message from Mellody Drown sent at 03/04/2020 9:34 AM EDT  Patient states she has severe pain on tailbone, very uncomfortable to sit, patient is currently taking OTC cream but states it doesn't work, wants to know what alternatives she can use.   Call History   Type Contact Phone  03/04/2020 09:39 AM EDT Phone (Outgoing) Susan House, Susan House "ANGELA" (Self) [phone] (H)  User: Elliot Cousin, RN  No Answer/Busy -  "Call cannot be completed at this time."  03/04/2020 09:33 AM EDT Phone (Incoming) Susan House, Susan House "[place]" (Self) [phone] (H)  User: Jaynie Collins D  Encounter Report  Patient Encounter Report    Answer Assessment - Initial Assessment Questions 1. ONSET: "When did the pain begin?"      Last week 2. LOCATION: "Where does it hurt?" (upper, mid or lower back)     Tailbone 3. SEVERITY: "How bad is the pain?"  (e.g., Scale 1-10; mild, moderate, or severe)   - MILD (1-3): doesn't interfere with normal activities    - MODERATE (4-7): interferes with normal activities or awakens from sleep    - SEVERE (8-10): excruciating pain, unable to do any normal activities      7 4. PATTERN: "Is the pain constant?" (e.g., yes, no; constant, intermittent)      Constant 5. RADIATION: "Does the pain shoot into your legs or elsewhere?"     No 6. CAUSE:  "What do you think is causing the back pain?"      Unsure 7. BACK OVERUSE:  "Any recent  lifting of heavy objects, strenuous work or exercise?"     No 8. MEDICATIONS: "What have you taken so far for the pain?" (e.g., nothing, acetaminophen, NSAIDS)     Tylenol 9. NEUROLOGIC SYMPTOMS: "Do you have any weakness, numbness, or problems with bowel/bladder control?"     No 10. OTHER SYMPTOMS: "Do you have any other symptoms?" (e.g., fever, abdominal pain, burning with urination, blood in urine)       No 11. PREGNANCY: "Is there any chance you are pregnant?" (e.g., yes, no; LMP)       No  Protocols used: BACK PAIN-A-AH

## 2020-03-04 NOTE — Telephone Encounter (Signed)
Please advise 

## 2020-03-04 NOTE — Telephone Encounter (Signed)
Seeing PA now.

## 2020-03-04 NOTE — Progress Notes (Signed)
Established patient visit   Patient: Susan House   DOB: Jul 10, 1956   64 y.o. Female  MRN: [id number] Visit Date: 03/04/2020  Today's healthcare provider: Trinna Post, PA-C   Chief Complaint  Patient presents with  . taibone pain   Subjective    HPI  Patient presents today c/o pain in her tailbone. She denies any injuries. She reports that she has had pain X 1 week. 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 She reports she does have incontinence episodes. She reports having this injury previously and was given a cream that made it completely go away.  She does have a home health service but reports they haven't been coming out recently because she has felt unwell.     Medications: Outpatient Medications Prior to Visit  Medication Sig  . amLODipine (NORVASC) 10 MG tablet TAKE 1 TABLET BY MOUTH  DAILY  . aspirin 81 MG chewable tablet Chew 1 tablet (81 mg total) by mouth daily.  Marland Kitchen atorvastatin (LIPITOR) 20 MG tablet TAKE 1 TABLET BY MOUTH  DAILY (Patient taking differently: Take 20 mg by mouth at bedtime. )  . clopidogrel (PLAVIX) 75 MG tablet TAKE 1 TABLET BY MOUTH  DAILY  . enalapril (VASOTEC) 20 MG tablet Take 1 tablet (20 mg total) by mouth 2 (two) times daily.  Marland Kitchen gabapentin (NEURONTIN) 100 MG capsule Take 200 mg by mouth 2 (two) times daily.   . hydrALAZINE (APRESOLINE) 100 MG tablet Take 1 tablet (100 mg total) by mouth 3 (three) times daily.  Marland Kitchen HYDROcodone-acetaminophen (NORCO) 5-325 MG tablet Take 2 tablets by mouth every 6 (six) hours as needed for moderate pain.  Marland Kitchen insulin degludec (TRESIBA FLEXTOUCH) 100 UNIT/ML SOPN FlexTouch Pen ADMINISTER UP TO 30 UNITS UNDER THE SKIN DAILY  . levETIRAcetam (KEPPRA) 500 MG tablet TAKE 1 TABLET BY MOUTH 2  TIMES DAILY  . metFORMIN (GLUCOPHAGE) 1000 MG tablet TAKE 1 TABLET BY MOUTH TWO  TIMES DAILY  . metoprolol succinate (TOPROL-XL) 100 MG 24 hr tablet TAKE 1 TABLET BY  MOUTH  DAILY  . nitroGLYCERIN (NITROSTAT) 0.4 MG SL tablet Place 1 tablet (0.4 mg total) under the tongue every 5 (five) minutes as needed for chest pain.  Marland Kitchen ondansetron (ZOFRAN) 4 MG tablet Take 1 tablet (4 mg total) by mouth every 8 (eight) hours as needed for nausea or vomiting.  . pantoprazole (PROTONIX) 40 MG tablet TAKE 1 TABLET BY MOUTH  DAILY  . azithromycin (ZITHROMAX Z-PAK) 250 MG tablet Take as directed. (Patient not taking: Reported on 03/04/2020)   No facility-administered medications prior to visit.    Review of Systems  Constitutional: Negative.   Musculoskeletal: Positive for arthralgias and myalgias.  Skin: Negative for color change, pallor, rash and wound.  Neurological: Negative.                                                Objective    BP (!) 92/54   Pulse 71   Temp 98.5 F (36.9 C)    Physical Exam Constitutional:      Appearance: She is obese.  Cardiovascular:     Rate and Rhythm: Normal rate.  Pulmonary:     Effort: Pulmonary effort is normal.  Skin:    General: Skin is warm and dry.  Neurological:     Mental Status: She is alert and oriented to person, place, and time. Mental status is at baseline.  Psychiatric:        Mood and Affect: Mood normal.        Behavior: Behavior normal.       No results found for any visits on 03/04/20.  Assessment & Plan    1. Pressure injury of skin of sacral region, unspecified injury stage  Recommend frequent position changes and pressure offloading, changing soiled briefs quickly, barrier cream. Offered referral to wound, patient declines. She will reach out to home health care and alert them of pressure injury for monitoring. We can sign any orders they need for supplies.  - Dimethicone (CAVILON DURABLE BARRIER) 1.3 % CREA; Apply barrier cream PRN  Dispense: 92 g; Refill: 0    No follow-ups on file.         Paulene Floor  [facility] [phone]  (phone) [phone] (fax)  [place]

## 2020-03-09 DIAGNOSIS — M1990 Unspecified osteoarthritis, unspecified site: Secondary | ICD-10-CM | POA: Diagnosis not present

## 2020-03-09 DIAGNOSIS — E785 Hyperlipidemia, unspecified: Secondary | ICD-10-CM | POA: Diagnosis not present

## 2020-03-09 DIAGNOSIS — G43909 Migraine, unspecified, not intractable, without status migrainosus: Secondary | ICD-10-CM | POA: Diagnosis not present

## 2020-03-09 DIAGNOSIS — S92421D Displaced fracture of distal phalanx of right great toe, subsequent encounter for fracture with routine healing: Secondary | ICD-10-CM | POA: Diagnosis not present

## 2020-03-09 DIAGNOSIS — Z7984 Long term (current) use of oral hypoglycemic drugs: Secondary | ICD-10-CM | POA: Diagnosis not present

## 2020-03-09 DIAGNOSIS — K219 Gastro-esophageal reflux disease without esophagitis: Secondary | ICD-10-CM | POA: Diagnosis not present

## 2020-03-09 DIAGNOSIS — I2510 Atherosclerotic heart disease of native coronary artery without angina pectoris: Secondary | ICD-10-CM | POA: Diagnosis not present

## 2020-03-09 DIAGNOSIS — S2242XD Multiple fractures of ribs, left side, subsequent encounter for fracture with routine healing: Secondary | ICD-10-CM | POA: Diagnosis not present

## 2020-03-09 DIAGNOSIS — Z8673 Personal history of transient ischemic attack (TIA), and cerebral infarction without residual deficits: Secondary | ICD-10-CM | POA: Diagnosis not present

## 2020-03-09 DIAGNOSIS — Z9181 History of falling: Secondary | ICD-10-CM | POA: Diagnosis not present

## 2020-03-09 DIAGNOSIS — S52612D Displaced fracture of left ulna styloid process, subsequent encounter for closed fracture with routine healing: Secondary | ICD-10-CM | POA: Diagnosis not present

## 2020-03-09 DIAGNOSIS — E1151 Type 2 diabetes mellitus with diabetic peripheral angiopathy without gangrene: Secondary | ICD-10-CM | POA: Diagnosis not present

## 2020-03-09 DIAGNOSIS — I119 Hypertensive heart disease without heart failure: Secondary | ICD-10-CM | POA: Diagnosis not present

## 2020-03-09 DIAGNOSIS — E1142 Type 2 diabetes mellitus with diabetic polyneuropathy: Secondary | ICD-10-CM | POA: Diagnosis not present

## 2020-03-09 DIAGNOSIS — Z7902 Long term (current) use of antithrombotics/antiplatelets: Secondary | ICD-10-CM | POA: Diagnosis not present

## 2020-03-10 DIAGNOSIS — Z8673 Personal history of transient ischemic attack (TIA), and cerebral infarction without residual deficits: Secondary | ICD-10-CM | POA: Diagnosis not present

## 2020-03-10 DIAGNOSIS — S92421D Displaced fracture of distal phalanx of right great toe, subsequent encounter for fracture with routine healing: Secondary | ICD-10-CM | POA: Diagnosis not present

## 2020-03-10 DIAGNOSIS — E785 Hyperlipidemia, unspecified: Secondary | ICD-10-CM | POA: Diagnosis not present

## 2020-03-10 DIAGNOSIS — B351 Tinea unguium: Secondary | ICD-10-CM | POA: Diagnosis not present

## 2020-03-10 DIAGNOSIS — E1140 Type 2 diabetes mellitus with diabetic neuropathy, unspecified: Secondary | ICD-10-CM | POA: Diagnosis not present

## 2020-03-10 DIAGNOSIS — Z7984 Long term (current) use of oral hypoglycemic drugs: Secondary | ICD-10-CM | POA: Diagnosis not present

## 2020-03-10 DIAGNOSIS — I119 Hypertensive heart disease without heart failure: Secondary | ICD-10-CM | POA: Diagnosis not present

## 2020-03-10 DIAGNOSIS — E1142 Type 2 diabetes mellitus with diabetic polyneuropathy: Secondary | ICD-10-CM | POA: Diagnosis not present

## 2020-03-10 DIAGNOSIS — I2510 Atherosclerotic heart disease of native coronary artery without angina pectoris: Secondary | ICD-10-CM | POA: Diagnosis not present

## 2020-03-10 DIAGNOSIS — S2242XD Multiple fractures of ribs, left side, subsequent encounter for fracture with routine healing: Secondary | ICD-10-CM | POA: Diagnosis not present

## 2020-03-10 DIAGNOSIS — G43909 Migraine, unspecified, not intractable, without status migrainosus: Secondary | ICD-10-CM | POA: Diagnosis not present

## 2020-03-10 DIAGNOSIS — Z9181 History of falling: Secondary | ICD-10-CM | POA: Diagnosis not present

## 2020-03-10 DIAGNOSIS — M1990 Unspecified osteoarthritis, unspecified site: Secondary | ICD-10-CM | POA: Diagnosis not present

## 2020-03-10 DIAGNOSIS — K219 Gastro-esophageal reflux disease without esophagitis: Secondary | ICD-10-CM | POA: Diagnosis not present

## 2020-03-10 DIAGNOSIS — Z7902 Long term (current) use of antithrombotics/antiplatelets: Secondary | ICD-10-CM | POA: Diagnosis not present

## 2020-03-10 DIAGNOSIS — E1151 Type 2 diabetes mellitus with diabetic peripheral angiopathy without gangrene: Secondary | ICD-10-CM | POA: Diagnosis not present

## 2020-03-10 DIAGNOSIS — S52612D Displaced fracture of left ulna styloid process, subsequent encounter for closed fracture with routine healing: Secondary | ICD-10-CM | POA: Diagnosis not present

## 2020-03-12 DIAGNOSIS — Z9181 History of falling: Secondary | ICD-10-CM | POA: Diagnosis not present

## 2020-03-12 DIAGNOSIS — E1142 Type 2 diabetes mellitus with diabetic polyneuropathy: Secondary | ICD-10-CM | POA: Diagnosis not present

## 2020-03-12 DIAGNOSIS — M1990 Unspecified osteoarthritis, unspecified site: Secondary | ICD-10-CM | POA: Diagnosis not present

## 2020-03-12 DIAGNOSIS — I119 Hypertensive heart disease without heart failure: Secondary | ICD-10-CM | POA: Diagnosis not present

## 2020-03-12 DIAGNOSIS — G43909 Migraine, unspecified, not intractable, without status migrainosus: Secondary | ICD-10-CM | POA: Diagnosis not present

## 2020-03-12 DIAGNOSIS — Z8673 Personal history of transient ischemic attack (TIA), and cerebral infarction without residual deficits: Secondary | ICD-10-CM | POA: Diagnosis not present

## 2020-03-12 DIAGNOSIS — K219 Gastro-esophageal reflux disease without esophagitis: Secondary | ICD-10-CM | POA: Diagnosis not present

## 2020-03-12 DIAGNOSIS — E1151 Type 2 diabetes mellitus with diabetic peripheral angiopathy without gangrene: Secondary | ICD-10-CM | POA: Diagnosis not present

## 2020-03-12 DIAGNOSIS — S92421D Displaced fracture of distal phalanx of right great toe, subsequent encounter for fracture with routine healing: Secondary | ICD-10-CM | POA: Diagnosis not present

## 2020-03-12 DIAGNOSIS — I2510 Atherosclerotic heart disease of native coronary artery without angina pectoris: Secondary | ICD-10-CM | POA: Diagnosis not present

## 2020-03-12 DIAGNOSIS — S2242XD Multiple fractures of ribs, left side, subsequent encounter for fracture with routine healing: Secondary | ICD-10-CM | POA: Diagnosis not present

## 2020-03-12 DIAGNOSIS — E785 Hyperlipidemia, unspecified: Secondary | ICD-10-CM | POA: Diagnosis not present

## 2020-03-12 DIAGNOSIS — Z7902 Long term (current) use of antithrombotics/antiplatelets: Secondary | ICD-10-CM | POA: Diagnosis not present

## 2020-03-12 DIAGNOSIS — S52612D Displaced fracture of left ulna styloid process, subsequent encounter for closed fracture with routine healing: Secondary | ICD-10-CM | POA: Diagnosis not present

## 2020-03-12 DIAGNOSIS — Z7984 Long term (current) use of oral hypoglycemic drugs: Secondary | ICD-10-CM | POA: Diagnosis not present

## 2020-03-15 DIAGNOSIS — I2510 Atherosclerotic heart disease of native coronary artery without angina pectoris: Secondary | ICD-10-CM | POA: Diagnosis not present

## 2020-03-15 DIAGNOSIS — S2242XD Multiple fractures of ribs, left side, subsequent encounter for fracture with routine healing: Secondary | ICD-10-CM | POA: Diagnosis not present

## 2020-03-15 DIAGNOSIS — G43909 Migraine, unspecified, not intractable, without status migrainosus: Secondary | ICD-10-CM | POA: Diagnosis not present

## 2020-03-15 DIAGNOSIS — E1142 Type 2 diabetes mellitus with diabetic polyneuropathy: Secondary | ICD-10-CM | POA: Diagnosis not present

## 2020-03-15 DIAGNOSIS — Z8673 Personal history of transient ischemic attack (TIA), and cerebral infarction without residual deficits: Secondary | ICD-10-CM | POA: Diagnosis not present

## 2020-03-15 DIAGNOSIS — K219 Gastro-esophageal reflux disease without esophagitis: Secondary | ICD-10-CM | POA: Diagnosis not present

## 2020-03-15 DIAGNOSIS — I119 Hypertensive heart disease without heart failure: Secondary | ICD-10-CM | POA: Diagnosis not present

## 2020-03-15 DIAGNOSIS — E785 Hyperlipidemia, unspecified: Secondary | ICD-10-CM | POA: Diagnosis not present

## 2020-03-15 DIAGNOSIS — Z9181 History of falling: Secondary | ICD-10-CM | POA: Diagnosis not present

## 2020-03-15 DIAGNOSIS — E1151 Type 2 diabetes mellitus with diabetic peripheral angiopathy without gangrene: Secondary | ICD-10-CM | POA: Diagnosis not present

## 2020-03-15 DIAGNOSIS — M1990 Unspecified osteoarthritis, unspecified site: Secondary | ICD-10-CM | POA: Diagnosis not present

## 2020-03-15 DIAGNOSIS — S92421D Displaced fracture of distal phalanx of right great toe, subsequent encounter for fracture with routine healing: Secondary | ICD-10-CM | POA: Diagnosis not present

## 2020-03-15 DIAGNOSIS — Z7902 Long term (current) use of antithrombotics/antiplatelets: Secondary | ICD-10-CM | POA: Diagnosis not present

## 2020-03-15 DIAGNOSIS — S52612D Displaced fracture of left ulna styloid process, subsequent encounter for closed fracture with routine healing: Secondary | ICD-10-CM | POA: Diagnosis not present

## 2020-03-15 DIAGNOSIS — Z7984 Long term (current) use of oral hypoglycemic drugs: Secondary | ICD-10-CM | POA: Diagnosis not present

## 2020-03-17 DIAGNOSIS — E1142 Type 2 diabetes mellitus with diabetic polyneuropathy: Secondary | ICD-10-CM | POA: Diagnosis not present

## 2020-03-17 DIAGNOSIS — Z9181 History of falling: Secondary | ICD-10-CM | POA: Diagnosis not present

## 2020-03-17 DIAGNOSIS — S92421D Displaced fracture of distal phalanx of right great toe, subsequent encounter for fracture with routine healing: Secondary | ICD-10-CM | POA: Diagnosis not present

## 2020-03-17 DIAGNOSIS — S2242XD Multiple fractures of ribs, left side, subsequent encounter for fracture with routine healing: Secondary | ICD-10-CM | POA: Diagnosis not present

## 2020-03-17 DIAGNOSIS — Z7902 Long term (current) use of antithrombotics/antiplatelets: Secondary | ICD-10-CM | POA: Diagnosis not present

## 2020-03-17 DIAGNOSIS — Z7984 Long term (current) use of oral hypoglycemic drugs: Secondary | ICD-10-CM | POA: Diagnosis not present

## 2020-03-17 DIAGNOSIS — E785 Hyperlipidemia, unspecified: Secondary | ICD-10-CM | POA: Diagnosis not present

## 2020-03-17 DIAGNOSIS — E1151 Type 2 diabetes mellitus with diabetic peripheral angiopathy without gangrene: Secondary | ICD-10-CM | POA: Diagnosis not present

## 2020-03-17 DIAGNOSIS — K219 Gastro-esophageal reflux disease without esophagitis: Secondary | ICD-10-CM | POA: Diagnosis not present

## 2020-03-17 DIAGNOSIS — Z8673 Personal history of transient ischemic attack (TIA), and cerebral infarction without residual deficits: Secondary | ICD-10-CM | POA: Diagnosis not present

## 2020-03-17 DIAGNOSIS — S52612D Displaced fracture of left ulna styloid process, subsequent encounter for closed fracture with routine healing: Secondary | ICD-10-CM | POA: Diagnosis not present

## 2020-03-17 DIAGNOSIS — I119 Hypertensive heart disease without heart failure: Secondary | ICD-10-CM | POA: Diagnosis not present

## 2020-03-17 DIAGNOSIS — G43909 Migraine, unspecified, not intractable, without status migrainosus: Secondary | ICD-10-CM | POA: Diagnosis not present

## 2020-03-17 DIAGNOSIS — I2510 Atherosclerotic heart disease of native coronary artery without angina pectoris: Secondary | ICD-10-CM | POA: Diagnosis not present

## 2020-03-17 DIAGNOSIS — M1990 Unspecified osteoarthritis, unspecified site: Secondary | ICD-10-CM | POA: Diagnosis not present

## 2020-03-19 ENCOUNTER — Other Ambulatory Visit: Payer: Self-pay | Admitting: Family Medicine

## 2020-03-19 DIAGNOSIS — N183 Chronic kidney disease, stage 3 unspecified: Secondary | ICD-10-CM

## 2020-03-23 DIAGNOSIS — S52612D Displaced fracture of left ulna styloid process, subsequent encounter for closed fracture with routine healing: Secondary | ICD-10-CM | POA: Diagnosis not present

## 2020-03-23 DIAGNOSIS — Z7902 Long term (current) use of antithrombotics/antiplatelets: Secondary | ICD-10-CM | POA: Diagnosis not present

## 2020-03-23 DIAGNOSIS — Z8673 Personal history of transient ischemic attack (TIA), and cerebral infarction without residual deficits: Secondary | ICD-10-CM | POA: Diagnosis not present

## 2020-03-23 DIAGNOSIS — M1990 Unspecified osteoarthritis, unspecified site: Secondary | ICD-10-CM | POA: Diagnosis not present

## 2020-03-23 DIAGNOSIS — E785 Hyperlipidemia, unspecified: Secondary | ICD-10-CM | POA: Diagnosis not present

## 2020-03-23 DIAGNOSIS — Z9181 History of falling: Secondary | ICD-10-CM | POA: Diagnosis not present

## 2020-03-23 DIAGNOSIS — G43909 Migraine, unspecified, not intractable, without status migrainosus: Secondary | ICD-10-CM | POA: Diagnosis not present

## 2020-03-23 DIAGNOSIS — E1142 Type 2 diabetes mellitus with diabetic polyneuropathy: Secondary | ICD-10-CM | POA: Diagnosis not present

## 2020-03-23 DIAGNOSIS — Z794 Long term (current) use of insulin: Secondary | ICD-10-CM | POA: Diagnosis not present

## 2020-03-23 DIAGNOSIS — K219 Gastro-esophageal reflux disease without esophagitis: Secondary | ICD-10-CM | POA: Diagnosis not present

## 2020-03-23 DIAGNOSIS — E1151 Type 2 diabetes mellitus with diabetic peripheral angiopathy without gangrene: Secondary | ICD-10-CM | POA: Diagnosis not present

## 2020-03-23 DIAGNOSIS — I2510 Atherosclerotic heart disease of native coronary artery without angina pectoris: Secondary | ICD-10-CM | POA: Diagnosis not present

## 2020-03-23 DIAGNOSIS — I119 Hypertensive heart disease without heart failure: Secondary | ICD-10-CM | POA: Diagnosis not present

## 2020-03-23 DIAGNOSIS — L89152 Pressure ulcer of sacral region, stage 2: Secondary | ICD-10-CM | POA: Diagnosis not present

## 2020-03-24 ENCOUNTER — Telehealth (INDEPENDENT_AMBULATORY_CARE_PROVIDER_SITE_OTHER): Payer: Medicare Other | Admitting: Cardiovascular Disease

## 2020-03-24 ENCOUNTER — Other Ambulatory Visit: Payer: Self-pay

## 2020-03-24 ENCOUNTER — Encounter: Payer: Self-pay | Admitting: Cardiovascular Disease

## 2020-03-24 VITALS — Ht 62.0 in | Wt 231.0 lb

## 2020-03-24 DIAGNOSIS — E118 Type 2 diabetes mellitus with unspecified complications: Secondary | ICD-10-CM

## 2020-03-24 DIAGNOSIS — I63512 Cerebral infarction due to unspecified occlusion or stenosis of left middle cerebral artery: Secondary | ICD-10-CM

## 2020-03-24 DIAGNOSIS — I739 Peripheral vascular disease, unspecified: Secondary | ICD-10-CM | POA: Diagnosis not present

## 2020-03-24 DIAGNOSIS — I6523 Occlusion and stenosis of bilateral carotid arteries: Secondary | ICD-10-CM | POA: Diagnosis not present

## 2020-03-24 DIAGNOSIS — I209 Angina pectoris, unspecified: Secondary | ICD-10-CM | POA: Diagnosis not present

## 2020-03-24 NOTE — Progress Notes (Signed)
Virtual Visit via Telephone Note   This visit type was conducted due to national recommendations for restrictions regarding the COVID-19 Pandemic (e.g. social distancing) in an effort to limit this patient's exposure and mitigate transmission in our community.  Due to her co-morbid illnesses, this patient is at least at moderate risk for complications without adequate follow up.  This format is felt to be most appropriate for this patient at this time.  The patient did not have access to video technology/had technical difficulties with video requiring transitioning to audio format only (telephone).  All issues noted in this document were discussed and addressed.  No physical exam could be performed with this format.  Please refer to the patient's chart for her  consent to telehealth for [facility].   I connected with  Susan House on 03/24/20 by a video enabled telemedicine application and verified that I am speaking with the correct person using two identifiers. I discussed the limitations of evaluation and management by telemedicine. The patient expressed understanding and agreed to proceed.   Evaluation Performed:  Follow-up visit  Date:  03/24/2020   ID:  Susan House, Susan House 1955/10/16, MRN [id number]  Patient Location:  [address] [place] [address]   Provider location:   [facility], [place] office  PCP:  Jerrol Banana., MD  Cardiologist:  Arvid Right [facility]  Chief Complaint  Patient presents with  . other    12 month f/u no complaints today. Meds reviewed verbally with pt.    History of Present Illness:    Susan House is a 64 y.o. female who presents via audio/video conferencing for a telehealth visit today.   The patient does not symptoms concerning for COVID-19 infection (fever, chills, cough, or new SHORTNESS OF BREATH).   Patient has a past medical history of stroke in 2000 followed by posterior circulatory TIA  in June of 2010,  chronic dizziness in the past secondary to dehydration,  Chronic chest pain restless leg syndrome, home type 2 diabetes, poorly controlled Morbid obesity She has residual left side weakness arm and leg from her stroke Carotid duplex showed 50-69% stenosis in the right internal carotid artery. <50% stenosis  Left CT scan chest 10/15/2016 showing diffuse three-vessel coronary disease who presents for routine follow-up of her stroke, chest pain symptoms.  Staying inside. Lives with husband Limited ADLs No regular exercise program, does not go out much  Was in the hospital 12/2019, records reviewed  worsening left sided weakness. Etiology is not clear. CT head is negative. MRI of the brain did not show new stroke. Seen by neurology, recommended she stay on aspirin Plavix For weakness, she did some home PT,  nurse visits  Lab work reviewed Most recent A1c9.8 Trying to watch her diet  BP low on visit with Dr. Rosanna Randy, had a UTI, 98 systolic Reports blood pressure has recovered since that time 130/60, at home Denies any orthostasis  No falls recently, walks with a walker  Other past medical history reviewed Prior urinary tract infection Klebsiella pneumonia   hospital August 30, 2018 with syncope secondary to hypoglycemia  fell into [place] developed significant bruising on the left side of her face. Presented to the hospital with headache, chest pain In the hospital had work-up for her chest pain Stress test September 26, 2018 Pharmacological myocardial perfusion imaging study with no significant  ischemia Normal wall motion, EF estimated at 71% No EKG  changes concerning for ischemia at peak stress or in recovery. Low risk scan   Admission September 2019 Left-sided weakness with TIA  possible seizure episode on 10/15/2016. Seen by neurology, MRI showing no stroke Question of seizure   emergency room 11/22/2016 for chest pain radiating through  to her back centralized chest pain  CT scan chest 10/15/2016 showing diffuse three-vessel coronary disease  Hospitalization December 2017 for weakness, dehydration, possible TIA, GI bleed, diarrhea Off plavix Had colo as an outpatient, was told that she had "ulcerations"   In hospital 07/07/16, possible TIA Could not hear in both the ears ,  felt her speech was slurred. CT scan of the head did not show any acute intracranial abnormality except for chronic white matter changes  Echo: No cardiac source of emboli was identified, EF 60-65%.  MRA HEAD Flow noted within the distal vertebral arteries and basilar artery. significant atherosclerotic changes of the posterior cerebral arteries and superior cerebral arteries greater on the right.  cardiac workup numerous echocardiograms dating back to 2008,  cardiac catheterization   Prior carotid ultrasound 2008 showing mild to moderate carotid arterial disease. Stress test July 2011 showing no ischemia.  Prior CV studies:   The following studies were reviewed today: Echo 03/2018 Left ventricle: The cavity size was normal. There was moderate concentric hypertrophy. Systolic function was normal. The estimated ejection fraction was in the range of 55% to 60%.   Past Medical History:  Diagnosis Date  . Allergy   . Arthritis   . Atypical chest pain    a. Reportedly nl cath in the past; b. 12/2009 MV: No ischemia/infarct. EF 87%.  . Carotid arterial disease ([place])    a. 08/4095 CTA head/neck: LICA 35H, RICA 29-->JME rx.  . Cellulitis and abscess of face   . Chronic Dizziness   . Coronary artery calcification seen on CT scan    a. 10/2016 CTA chest: Atherosclerotic calcifications Ao, cors, and prox great vessels.  . Depression   . Diabetes mellitus ([place])   . Diastolic dysfunction    a. 2008 Echo: Nl EF; b. 2011 Echo: Nl EF; c. 06/2016 Echo: EF 55-60%, Gr1DD; d. 03/2018 Echo: EF 55-60%, no rwma, Gr1DD, mod dil LA.  . Edema   . GERD  (gastroesophageal reflux disease)   . Headache   . Hematuria   . Hyperlipidemia   . Hypertension   . IBS (irritable bowel syndrome)   . Migraine   . Morbid obesity ([place])   . Nocturia   . Possible Seizure ([place])    a. 10/2016 ? sz. MRI neg for stroke.  . Stroke ([place]) 2000  . TIA (transient ischemic attack) 2010 & 2019   a. 2010; b. 03/2018 Left sided wkns-->MRI neg for stroke. CTA head/neck: Right A2 cut off/occlusion w/ collats extending over R cerebral convexity. LICA 26S, RICA 45, RSubclavian 50->med rx.  . Urgency of micturation   . Urinary frequency   . Urinary incontinence    Past Surgical History:  Procedure Laterality Date  . ABDOMINAL HYSTERECTOMY    . ABDOMINOPLASTY    . APPENDECTOMY    . BACK SURGERY     extensive spinal fusion  . carpel tunnel surgery    . CATARACT EXTRACTION     x 2  . CESAREAN SECTION    . CHOLECYSTECTOMY    . COLONOSCOPY  2010  . COLONOSCOPY WITH PROPOFOL N/A 07/11/2016   Procedure: COLONOSCOPY WITH PROPOFOL;  Surgeon: Lucilla Lame, MD;  Location: [facility] ENDOSCOPY;  Service:  Endoscopy;  Laterality: N/A;  . COLONOSCOPY WITH PROPOFOL N/A 03/26/2018   Procedure: COLONOSCOPY WITH PROPOFOL;  Surgeon: Lucilla Lame, MD;  Location: [facility] ENDOSCOPY;  Service: Endoscopy;  Laterality: N/A;  . ESOPHAGOGASTRODUODENOSCOPY (EGD) WITH PROPOFOL N/A 03/26/2018   Procedure: ESOPHAGOGASTRODUODENOSCOPY (EGD) WITH PROPOFOL;  Surgeon: Lucilla Lame, MD;  Location: [facility] ENDOSCOPY;  Service: Endoscopy;  Laterality: N/A;  . EYE SURGERY    . HERNIA REPAIR    . SHOULDER SURGERY    . TOE SURGERY    . UPPER GI ENDOSCOPY    . WRIST FRACTURE SURGERY Left      Current Meds  Medication Sig  . amLODipine (NORVASC) 10 MG tablet TAKE 1 TABLET BY MOUTH  DAILY  . aspirin 81 MG chewable tablet Chew 1 tablet (81 mg total) by mouth daily.  Marland Kitchen atorvastatin (LIPITOR) 20 MG tablet TAKE 1 TABLET BY MOUTH  DAILY (Patient taking differently: Take 20 mg by mouth at bedtime. )  . clopidogrel  (PLAVIX) 75 MG tablet TAKE 1 TABLET BY MOUTH  DAILY  . Dimethicone (CAVILON DURABLE BARRIER) 1.3 % CREA Apply barrier cream PRN  . enalapril (VASOTEC) 20 MG tablet Take 1 tablet (20 mg total) by mouth 2 (two) times daily.  Marland Kitchen gabapentin (NEURONTIN) 100 MG capsule Take 200 mg by mouth 2 (two) times daily.   . hydrALAZINE (APRESOLINE) 100 MG tablet Take 1 tablet (100 mg total) by mouth 3 (three) times daily. (Patient taking differently: Take 100 mg by mouth 2 (two) times daily. )  . HYDROcodone-acetaminophen (NORCO) 5-325 MG tablet Take 2 tablets by mouth every 6 (six) hours as needed for moderate pain.  Marland Kitchen insulin degludec (TRESIBA FLEXTOUCH) 100 UNIT/ML SOPN FlexTouch Pen ADMINISTER UP TO 30 UNITS UNDER THE SKIN DAILY  . levETIRAcetam (KEPPRA) 500 MG tablet TAKE 1 TABLET BY MOUTH 2  TIMES DAILY  . metFORMIN (GLUCOPHAGE) 1000 MG tablet TAKE 1 TABLET BY MOUTH  TWICE DAILY  . metoprolol succinate (TOPROL-XL) 100 MG 24 hr tablet TAKE 1 TABLET BY MOUTH  DAILY  . nitroGLYCERIN (NITROSTAT) 0.4 MG SL tablet Place 1 tablet (0.4 mg total) under the tongue every 5 (five) minutes as needed for chest pain.  Marland Kitchen ondansetron (ZOFRAN) 4 MG tablet Take 1 tablet (4 mg total) by mouth every 8 (eight) hours as needed for nausea or vomiting.  . pantoprazole (PROTONIX) 40 MG tablet TAKE 1 TABLET BY MOUTH  DAILY     Allergies:   Morphine and related, Reglan [metoclopramide], Simvastatin, Betadine [povidone iodine], and Tetracyclines & related   Social History   Tobacco Use  . Smoking status: Never Smoker  . Smokeless tobacco: Never Used  Vaping Use  . Vaping Use: Never used  Substance Use Topics  . Alcohol use: No  . Drug use: No     \ Family Hx: The patient's family history includes Arrhythmia in her mother; Coronary artery disease in her father; Depression in her sister and sister; Diabetes in her father, sister, and sister; Heart attack (age of onset: 69) in her father; Heart disease in her father and mother;  Hyperlipidemia in her father, mother, sister, and sister; Hypertension in her father, mother, sister, and sister; Kidney disease in her paternal grandmother; Rheum arthritis in her mother; Stroke in her father.  ROS:   Please see the history of present illness.    Review of Systems  Constitutional: Negative.   Respiratory: Negative.   Cardiovascular: Negative.   Gastrointestinal: Negative.   Musculoskeletal: Negative.  Gait instability  Neurological: Negative.   Psychiatric/Behavioral: Negative.   All other systems reviewed and are negative.     Labs/Other Tests and Data Reviewed:    Recent Labs: 12/23/2019: ALT 15 12/24/2019: BUN 23; Creatinine, Ser 1.40; Hemoglobin 11.1; Platelets 243; Potassium 4.2; Sodium 139   Recent Lipid Panel Lab Results  Component Value Date/Time   CHOL 105 12/24/2019 05:13 AM   CHOL 110 01/18/2014 12:59 AM   TRIG 144 12/24/2019 05:13 AM   TRIG 122 01/18/2014 12:59 AM   HDL 35 (L) 12/24/2019 05:13 AM   HDL 35 (L) 01/18/2014 12:59 AM   CHOLHDL 3.0 12/24/2019 05:13 AM   LDLCALC 41 12/24/2019 05:13 AM   LDLCALC 51 01/18/2014 12:59 AM    Wt Readings from Last 3 Encounters:  03/24/20 231 lb (104.8 kg)  02/11/20 (!) 231 lb (104.8 kg)  12/25/19 176 lb 5.9 oz (80 kg)     Exam:    Vital Signs: Vital signs may also be detailed in the HPI Ht 5\' 2"  (1.575 m)   Wt 231 lb (104.8 kg)   BMI 42.25 kg/m   Wt Readings from Last 3 Encounters:  03/24/20 231 lb (104.8 kg)  02/11/20 (!) 231 lb (104.8 kg)  12/25/19 176 lb 5.9 oz (80 kg)   Temp Readings from Last 3 Encounters:  03/04/20 98.5 F (36.9 C)  02/18/20 (!) 97.5 F (36.4 C) (Oral)  01/13/20 (!) 96.9 F (36.1 C) (Temporal)   BP Readings from Last 3 Encounters:  03/04/20 (!) 92/54  02/18/20 (!) 98/56  02/11/20 (!) 147/75   Pulse Readings from Last 3 Encounters:  03/04/20 71  02/18/20 95  02/11/20 75     Well nourished, well developed female in no acute distress. Constitutional:   oriented to person, place, and time. No distress.    ASSESSMENT & PLAN:    Chronic chest pain On discussion today, no significant change in her symptoms, not having any escalating anginal complaints Previous work-up including stress testing, cardiac catheterizations, echocardiograms There is coronary calcification seen on CT scan As previously we have stressed importance of aggressive diabetes control Cholesterol at goal No change to medications, no further testing  Peripheral artery disease ([place]) Bilateral carotid disease Followed by vascular, had carotid ultrasound last year, stable disease  Cerebrovascular accident (CVA) due to other mechanism [facility]) Has been evaluated by neurology, recent weakness concerning for TIA, Hospital records reviewed with her in detail She feels back to her baseline Stressed importance of staying on her aspirin Plavix  Type 2 diabetes mellitus with complication, with long-term current use of insulin ([place]) Long history of poorly controlled diabetes Unable to exercise, chronic back and joint pain Stressed strict low carbohydrate diet  Mixed hyperlipidemia Cholesterol is at goal on the current lipid regimen. No changes to the medications were made.  Stable  Essential hypertension Had low blood pressure in the setting of UTI, she will continue to monitor blood pressure at home and if it runs low she will call our office with Dr. Rosanna Randy out of concern for recurrent UTI   COVID-19 Education: The signs and symptoms of COVID-19 were discussed with the patient and how to seek care for testing (follow up with PCP or arrange E-visit).  The importance of social distancing was discussed today.  Patient Risk:   After full review of this patients clinical status, I feel that they are at least moderate risk at this time.  Time:   Today, I have spent 25 minutes with  the patient with telehealth technology discussing the cardiac and medical problems/diagnoses  detailed above   10 min spent reviewing the chart prior to patient visit today   Medication Adjustments/Labs and Tests Ordered: Current medicines are reviewed at length with the patient today.  Concerns regarding medicines are outlined above.   Tests Ordered: No tests ordered   Medication Changes: No changes made     Signed, Ida Rogue, MD  03/24/2020 9:45 AM    [place] Office [address], [place],  [address]

## 2020-03-24 NOTE — Patient Instructions (Addendum)
Medication Instructions:  No changes  If you need a refill on your cardiac medications before your next appointment, please call your pharmacy.    Lab work: No new labs needed   If you have labs (blood work) drawn today and your tests are completely normal, you will receive your results only by: . MyChart Message (if you have MyChart) OR . A paper copy in the mail If you have any lab test that is abnormal or we need to change your treatment, we will call you to review the results.   Testing/Procedures: No new testing needed   Follow-Up: At [facility], you and your health needs are our priority.  As part of our continuing mission to provide you with exceptional heart care, we have created designated Provider Care Teams.  These Care Teams include your primary Cardiologist (physician) and Advanced Practice Providers (APPs -  Physician Assistants and Nurse Practitioners) who all work together to provide you with the care you need, when you need it.  . You will need a follow up appointment in 6 months  . Providers on your designated Care Team:   . Christopher Berge, NP . Ryan Dunn, PA-C . Jacquelyn Visser, PA-C  Any Other Special Instructions Will Be Listed Below (If Applicable).  COVID-19 Vaccine Information can be found at: [email or web address] For questions related to vaccine distribution or appointments, please email [email or web address] or call [phone].     

## 2020-03-26 NOTE — Progress Notes (Deleted)
     Established patient visit   Patient: Susan House   DOB: [birth year]   64 y.o. Female  MRN: [id number] Visit Date: 03/29/2020  Today's healthcare provider: Wilhemena Durie, MD   No chief complaint on file.  Subjective    HPI  ***  {Show patient history (optional):23778::" "}   Medications: Outpatient Medications Prior to Visit  Medication Sig  . amLODipine (NORVASC) 10 MG tablet TAKE 1 TABLET BY MOUTH  DAILY  . aspirin 81 MG chewable tablet Chew 1 tablet (81 mg total) by mouth daily.  Marland Kitchen atorvastatin (LIPITOR) 20 MG tablet TAKE 1 TABLET BY MOUTH  DAILY (Patient taking differently: Take 20 mg by mouth at bedtime. )  . clopidogrel (PLAVIX) 75 MG tablet TAKE 1 TABLET BY MOUTH  DAILY  . Dimethicone (CAVILON DURABLE BARRIER) 1.3 % CREA Apply barrier cream PRN  . enalapril (VASOTEC) 20 MG tablet Take 1 tablet (20 mg total) by mouth 2 (two) times daily.  Marland Kitchen gabapentin (NEURONTIN) 100 MG capsule Take 200 mg by mouth 2 (two) times daily.   . hydrALAZINE (APRESOLINE) 100 MG tablet Take 1 tablet (100 mg total) by mouth 3 (three) times daily. (Patient taking differently: Take 100 mg by mouth 2 (two) times daily. )  . HYDROcodone-acetaminophen (NORCO) 5-325 MG tablet Take 2 tablets by mouth every 6 (six) hours as needed for moderate pain.  Marland Kitchen insulin degludec (TRESIBA FLEXTOUCH) 100 UNIT/ML SOPN FlexTouch Pen ADMINISTER UP TO 30 UNITS UNDER THE SKIN DAILY  . levETIRAcetam (KEPPRA) 500 MG tablet TAKE 1 TABLET BY MOUTH 2  TIMES DAILY  . metFORMIN (GLUCOPHAGE) 1000 MG tablet TAKE 1 TABLET BY MOUTH  TWICE DAILY  . metoprolol succinate (TOPROL-XL) 100 MG 24 hr tablet TAKE 1 TABLET BY MOUTH  DAILY  . nitroGLYCERIN (NITROSTAT) 0.4 MG SL tablet Place 1 tablet (0.4 mg total) under the tongue every 5 (five) minutes as needed for chest pain.  Marland Kitchen ondansetron (ZOFRAN) 4 MG tablet Take 1 tablet (4 mg total) by mouth every 8 (eight) hours as needed for nausea or vomiting.  . pantoprazole  (PROTONIX) 40 MG tablet TAKE 1 TABLET BY MOUTH  DAILY   No facility-administered medications prior to visit.    Review of Systems  Constitutional: Negative for appetite change, chills, fatigue and fever.  Respiratory: Negative for chest tightness and shortness of breath.   Cardiovascular: Negative for chest pain and palpitations.  Gastrointestinal: Negative for abdominal pain, nausea and vomiting.  Neurological: Negative for dizziness and weakness.    {Heme  Chem  Endocrine  Serology  Results Review (optional):23779::" "}  Objective    There were no vitals taken for this visit. {Show previous vital signs (optional):23777::" "}  Physical Exam  ***  No results found for any visits on 03/29/20.  Assessment & Plan     ***  No follow-ups on file.      {provider attestation***:1}   Wilhemena Durie, MD  [facility] [phone] (phone) [phone] (fax)  [place]

## 2020-03-29 ENCOUNTER — Ambulatory Visit: Payer: Self-pay | Admitting: Family Medicine

## 2020-03-30 DIAGNOSIS — Z8673 Personal history of transient ischemic attack (TIA), and cerebral infarction without residual deficits: Secondary | ICD-10-CM | POA: Diagnosis not present

## 2020-03-30 DIAGNOSIS — E785 Hyperlipidemia, unspecified: Secondary | ICD-10-CM | POA: Diagnosis not present

## 2020-03-30 DIAGNOSIS — I2510 Atherosclerotic heart disease of native coronary artery without angina pectoris: Secondary | ICD-10-CM | POA: Diagnosis not present

## 2020-03-30 DIAGNOSIS — Z794 Long term (current) use of insulin: Secondary | ICD-10-CM | POA: Diagnosis not present

## 2020-03-30 DIAGNOSIS — M1990 Unspecified osteoarthritis, unspecified site: Secondary | ICD-10-CM | POA: Diagnosis not present

## 2020-03-30 DIAGNOSIS — S52612D Displaced fracture of left ulna styloid process, subsequent encounter for closed fracture with routine healing: Secondary | ICD-10-CM | POA: Diagnosis not present

## 2020-03-30 DIAGNOSIS — G43909 Migraine, unspecified, not intractable, without status migrainosus: Secondary | ICD-10-CM | POA: Diagnosis not present

## 2020-03-30 DIAGNOSIS — E1151 Type 2 diabetes mellitus with diabetic peripheral angiopathy without gangrene: Secondary | ICD-10-CM | POA: Diagnosis not present

## 2020-03-30 DIAGNOSIS — Z9181 History of falling: Secondary | ICD-10-CM | POA: Diagnosis not present

## 2020-03-30 DIAGNOSIS — I119 Hypertensive heart disease without heart failure: Secondary | ICD-10-CM | POA: Diagnosis not present

## 2020-03-30 DIAGNOSIS — L89152 Pressure ulcer of sacral region, stage 2: Secondary | ICD-10-CM | POA: Diagnosis not present

## 2020-03-30 DIAGNOSIS — K219 Gastro-esophageal reflux disease without esophagitis: Secondary | ICD-10-CM | POA: Diagnosis not present

## 2020-03-30 DIAGNOSIS — Z7902 Long term (current) use of antithrombotics/antiplatelets: Secondary | ICD-10-CM | POA: Diagnosis not present

## 2020-03-30 DIAGNOSIS — E1142 Type 2 diabetes mellitus with diabetic polyneuropathy: Secondary | ICD-10-CM | POA: Diagnosis not present

## 2020-04-05 ENCOUNTER — Telehealth: Payer: Self-pay | Admitting: Family Medicine

## 2020-04-05 DIAGNOSIS — S52612D Displaced fracture of left ulna styloid process, subsequent encounter for closed fracture with routine healing: Secondary | ICD-10-CM | POA: Diagnosis not present

## 2020-04-05 DIAGNOSIS — Z9181 History of falling: Secondary | ICD-10-CM | POA: Diagnosis not present

## 2020-04-05 DIAGNOSIS — M1990 Unspecified osteoarthritis, unspecified site: Secondary | ICD-10-CM | POA: Diagnosis not present

## 2020-04-05 DIAGNOSIS — L89152 Pressure ulcer of sacral region, stage 2: Secondary | ICD-10-CM | POA: Diagnosis not present

## 2020-04-05 DIAGNOSIS — E785 Hyperlipidemia, unspecified: Secondary | ICD-10-CM | POA: Diagnosis not present

## 2020-04-05 DIAGNOSIS — K219 Gastro-esophageal reflux disease without esophagitis: Secondary | ICD-10-CM | POA: Diagnosis not present

## 2020-04-05 DIAGNOSIS — Z7902 Long term (current) use of antithrombotics/antiplatelets: Secondary | ICD-10-CM | POA: Diagnosis not present

## 2020-04-05 DIAGNOSIS — G43909 Migraine, unspecified, not intractable, without status migrainosus: Secondary | ICD-10-CM | POA: Diagnosis not present

## 2020-04-05 DIAGNOSIS — E1142 Type 2 diabetes mellitus with diabetic polyneuropathy: Secondary | ICD-10-CM | POA: Diagnosis not present

## 2020-04-05 DIAGNOSIS — I2510 Atherosclerotic heart disease of native coronary artery without angina pectoris: Secondary | ICD-10-CM | POA: Diagnosis not present

## 2020-04-05 DIAGNOSIS — Z8673 Personal history of transient ischemic attack (TIA), and cerebral infarction without residual deficits: Secondary | ICD-10-CM | POA: Diagnosis not present

## 2020-04-05 DIAGNOSIS — E1151 Type 2 diabetes mellitus with diabetic peripheral angiopathy without gangrene: Secondary | ICD-10-CM | POA: Diagnosis not present

## 2020-04-05 DIAGNOSIS — I119 Hypertensive heart disease without heart failure: Secondary | ICD-10-CM | POA: Diagnosis not present

## 2020-04-05 DIAGNOSIS — Z794 Long term (current) use of insulin: Secondary | ICD-10-CM | POA: Diagnosis not present

## 2020-04-05 NOTE — Telephone Encounter (Signed)
ok 

## 2020-04-05 NOTE — Telephone Encounter (Signed)
Samantha with [facility] called saying they need a verbal to discontinue pt's wound care.  She is completely healed.

## 2020-04-06 NOTE — Telephone Encounter (Signed)
Left detailed message on Susan House's vm giving verbal.

## 2020-04-08 ENCOUNTER — Ambulatory Visit: Payer: Self-pay | Admitting: Family Medicine

## 2020-04-12 DIAGNOSIS — I2510 Atherosclerotic heart disease of native coronary artery without angina pectoris: Secondary | ICD-10-CM | POA: Diagnosis not present

## 2020-04-12 DIAGNOSIS — G43909 Migraine, unspecified, not intractable, without status migrainosus: Secondary | ICD-10-CM | POA: Diagnosis not present

## 2020-04-12 DIAGNOSIS — E1151 Type 2 diabetes mellitus with diabetic peripheral angiopathy without gangrene: Secondary | ICD-10-CM | POA: Diagnosis not present

## 2020-04-12 DIAGNOSIS — I119 Hypertensive heart disease without heart failure: Secondary | ICD-10-CM | POA: Diagnosis not present

## 2020-04-12 DIAGNOSIS — Z794 Long term (current) use of insulin: Secondary | ICD-10-CM | POA: Diagnosis not present

## 2020-04-12 DIAGNOSIS — K219 Gastro-esophageal reflux disease without esophagitis: Secondary | ICD-10-CM | POA: Diagnosis not present

## 2020-04-12 DIAGNOSIS — Z7902 Long term (current) use of antithrombotics/antiplatelets: Secondary | ICD-10-CM | POA: Diagnosis not present

## 2020-04-12 DIAGNOSIS — E785 Hyperlipidemia, unspecified: Secondary | ICD-10-CM | POA: Diagnosis not present

## 2020-04-12 DIAGNOSIS — S52612D Displaced fracture of left ulna styloid process, subsequent encounter for closed fracture with routine healing: Secondary | ICD-10-CM | POA: Diagnosis not present

## 2020-04-12 DIAGNOSIS — L89152 Pressure ulcer of sacral region, stage 2: Secondary | ICD-10-CM | POA: Diagnosis not present

## 2020-04-12 DIAGNOSIS — M1990 Unspecified osteoarthritis, unspecified site: Secondary | ICD-10-CM | POA: Diagnosis not present

## 2020-04-12 DIAGNOSIS — Z8673 Personal history of transient ischemic attack (TIA), and cerebral infarction without residual deficits: Secondary | ICD-10-CM | POA: Diagnosis not present

## 2020-04-12 DIAGNOSIS — E1142 Type 2 diabetes mellitus with diabetic polyneuropathy: Secondary | ICD-10-CM | POA: Diagnosis not present

## 2020-04-12 DIAGNOSIS — Z9181 History of falling: Secondary | ICD-10-CM | POA: Diagnosis not present

## 2020-04-24 IMAGING — CR DG CHEST 2V
2 series · 2 of 2 positions shown · non-contrast
Comparison: Chest radiograph dated 11/22/2016.

CLINICAL DATA: 62-year-old female with shortness of breath.

EXAM:
CHEST - 2 VIEW

[chest pa]
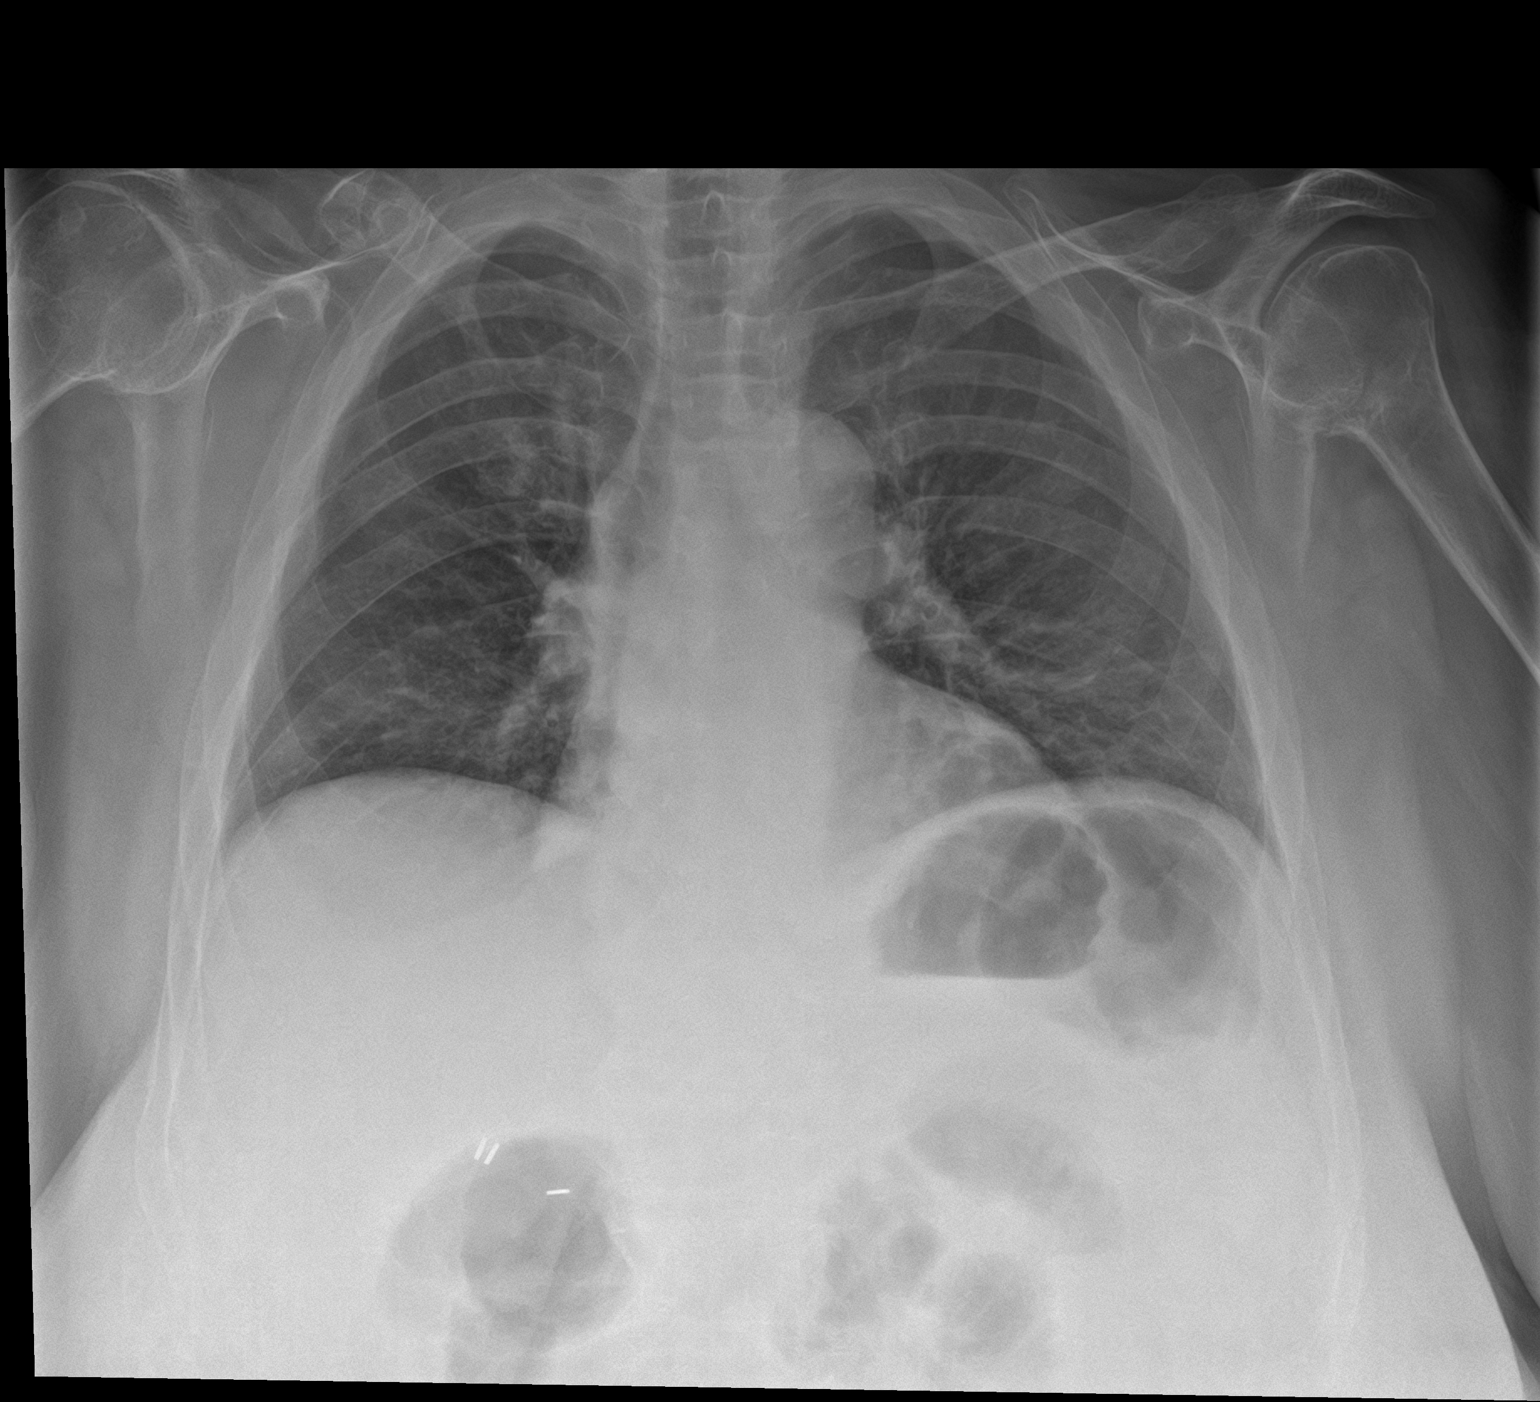

[chest lat]
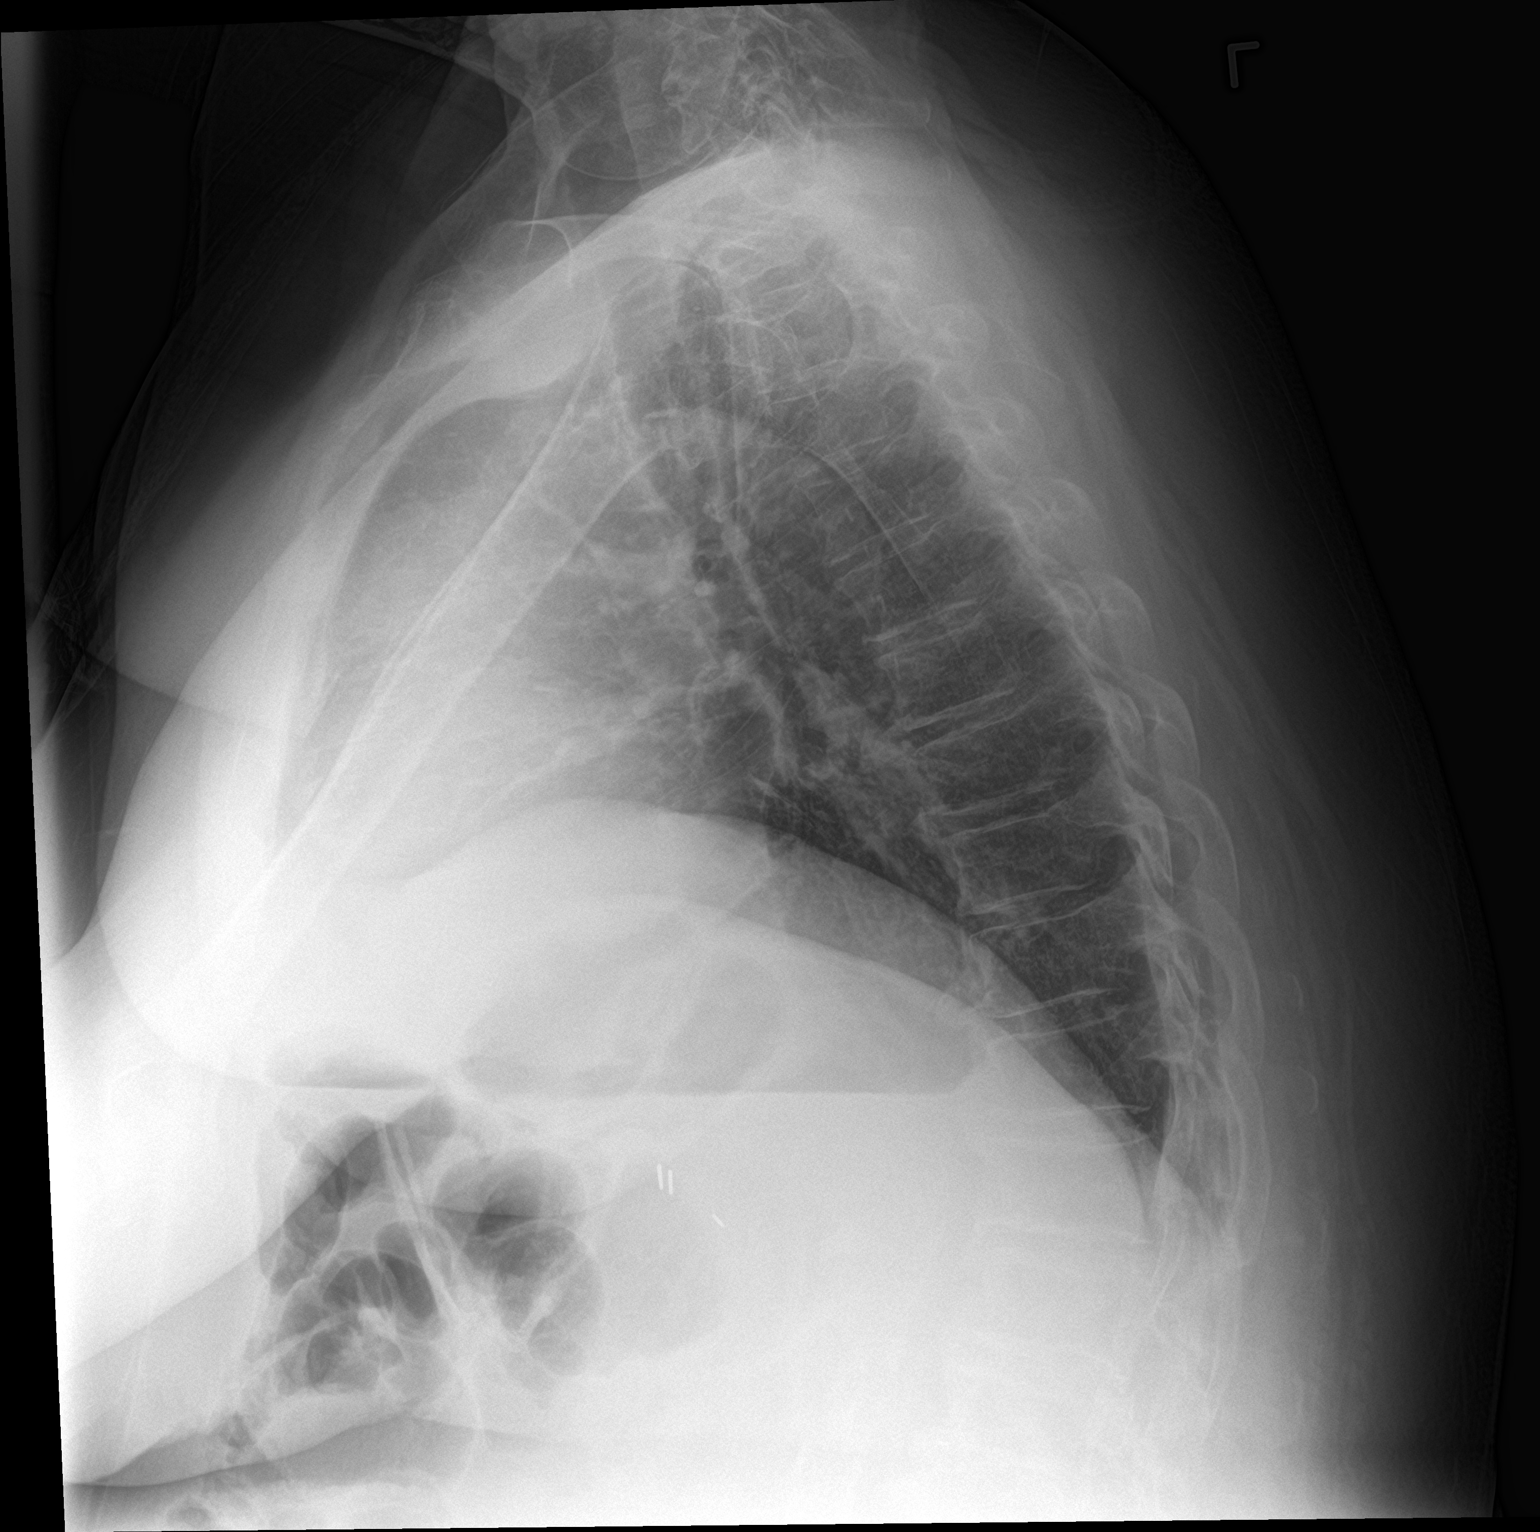

[2 of 2 positions shown; findings below may reference images not displayed]

FINDINGS: Shallow inspiration. No focal consolidation, pleural effusion, or
pneumothorax. The cardiac silhouette is within normal limits. Old
right clavicular fracture. No acute osseous pathology.
IMPRESSION: No active cardiopulmonary disease.

## 2020-04-27 ENCOUNTER — Inpatient Hospital Stay: Payer: Medicare Other

## 2020-04-27 ENCOUNTER — Other Ambulatory Visit: Payer: Self-pay

## 2020-04-27 ENCOUNTER — Inpatient Hospital Stay
Admission: EM | Admit: 2020-04-27 | Discharge: 2020-05-09 | DRG: 871 | Disposition: A | Discharge status: Skilled Nursing Facility | Payer: Medicare Other | Attending: Internal Medicine | Admitting: Internal Medicine

## 2020-04-27 ENCOUNTER — Emergency Department: Payer: Medicare Other

## 2020-04-27 DIAGNOSIS — Z8249 Family history of ischemic heart disease and other diseases of the circulatory system: Secondary | ICD-10-CM

## 2020-04-27 DIAGNOSIS — I69398 Other sequelae of cerebral infarction: Secondary | ICD-10-CM

## 2020-04-27 DIAGNOSIS — R1110 Vomiting, unspecified: Secondary | ICD-10-CM | POA: Diagnosis not present

## 2020-04-27 DIAGNOSIS — R569 Unspecified convulsions: Secondary | ICD-10-CM | POA: Diagnosis not present

## 2020-04-27 DIAGNOSIS — Z818 Family history of other mental and behavioral disorders: Secondary | ICD-10-CM

## 2020-04-27 DIAGNOSIS — Z7401 Bed confinement status: Secondary | ICD-10-CM | POA: Diagnosis not present

## 2020-04-27 DIAGNOSIS — M24542 Contracture, left hand: Secondary | ICD-10-CM | POA: Diagnosis present

## 2020-04-27 DIAGNOSIS — Z7982 Long term (current) use of aspirin: Secondary | ICD-10-CM

## 2020-04-27 DIAGNOSIS — Z981 Arthrodesis status: Secondary | ICD-10-CM

## 2020-04-27 DIAGNOSIS — E669 Obesity, unspecified: Secondary | ICD-10-CM | POA: Diagnosis present

## 2020-04-27 DIAGNOSIS — E440 Moderate protein-calorie malnutrition: Secondary | ICD-10-CM | POA: Diagnosis present

## 2020-04-27 DIAGNOSIS — Z7902 Long term (current) use of antithrombotics/antiplatelets: Secondary | ICD-10-CM

## 2020-04-27 DIAGNOSIS — E785 Hyperlipidemia, unspecified: Secondary | ICD-10-CM | POA: Diagnosis not present

## 2020-04-27 DIAGNOSIS — I340 Nonrheumatic mitral (valve) insufficiency: Secondary | ICD-10-CM | POA: Diagnosis not present

## 2020-04-27 DIAGNOSIS — R000 Tachycardia, unspecified: Secondary | ICD-10-CM | POA: Diagnosis not present

## 2020-04-27 DIAGNOSIS — R296 Repeated falls: Secondary | ICD-10-CM | POA: Diagnosis present

## 2020-04-27 DIAGNOSIS — B9561 Methicillin susceptible Staphylococcus aureus infection as the cause of diseases classified elsewhere: Secondary | ICD-10-CM | POA: Diagnosis present

## 2020-04-27 DIAGNOSIS — R4702 Dysphasia: Secondary | ICD-10-CM | POA: Diagnosis present

## 2020-04-27 DIAGNOSIS — R4701 Aphasia: Secondary | ICD-10-CM | POA: Diagnosis not present

## 2020-04-27 DIAGNOSIS — I69320 Aphasia following cerebral infarction: Secondary | ICD-10-CM | POA: Diagnosis not present

## 2020-04-27 DIAGNOSIS — E1165 Type 2 diabetes mellitus with hyperglycemia: Secondary | ICD-10-CM | POA: Diagnosis not present

## 2020-04-27 DIAGNOSIS — I6521 Occlusion and stenosis of right carotid artery: Secondary | ICD-10-CM | POA: Diagnosis present

## 2020-04-27 DIAGNOSIS — E1149 Type 2 diabetes mellitus with other diabetic neurological complication: Secondary | ICD-10-CM | POA: Diagnosis not present

## 2020-04-27 DIAGNOSIS — K219 Gastro-esophageal reflux disease without esophagitis: Secondary | ICD-10-CM | POA: Diagnosis not present

## 2020-04-27 DIAGNOSIS — N171 Acute kidney failure with acute cortical necrosis: Secondary | ICD-10-CM | POA: Diagnosis not present

## 2020-04-27 DIAGNOSIS — R0682 Tachypnea, not elsewhere classified: Secondary | ICD-10-CM | POA: Diagnosis present

## 2020-04-27 DIAGNOSIS — D631 Anemia in chronic kidney disease: Secondary | ICD-10-CM | POA: Diagnosis not present

## 2020-04-27 DIAGNOSIS — B952 Enterococcus as the cause of diseases classified elsewhere: Secondary | ICD-10-CM | POA: Diagnosis present

## 2020-04-27 DIAGNOSIS — Z79899 Other long term (current) drug therapy: Secondary | ICD-10-CM

## 2020-04-27 DIAGNOSIS — E876 Hypokalemia: Secondary | ICD-10-CM | POA: Diagnosis present

## 2020-04-27 DIAGNOSIS — A4101 Sepsis due to Methicillin susceptible Staphylococcus aureus: Secondary | ICD-10-CM | POA: Diagnosis not present

## 2020-04-27 DIAGNOSIS — Z6839 Body mass index (BMI) 39.0-39.9, adult: Secondary | ICD-10-CM

## 2020-04-27 DIAGNOSIS — L8990 Pressure ulcer of unspecified site, unspecified stage: Secondary | ICD-10-CM | POA: Insufficient documentation

## 2020-04-27 DIAGNOSIS — E1151 Type 2 diabetes mellitus with diabetic peripheral angiopathy without gangrene: Secondary | ICD-10-CM | POA: Diagnosis present

## 2020-04-27 DIAGNOSIS — Z8673 Personal history of transient ischemic attack (TIA), and cerebral infarction without residual deficits: Secondary | ICD-10-CM | POA: Diagnosis not present

## 2020-04-27 DIAGNOSIS — R7989 Other specified abnormal findings of blood chemistry: Secondary | ICD-10-CM | POA: Diagnosis not present

## 2020-04-27 DIAGNOSIS — E860 Dehydration: Secondary | ICD-10-CM | POA: Diagnosis present

## 2020-04-27 DIAGNOSIS — I129 Hypertensive chronic kidney disease with stage 1 through stage 4 chronic kidney disease, or unspecified chronic kidney disease: Secondary | ICD-10-CM | POA: Diagnosis not present

## 2020-04-27 DIAGNOSIS — I639 Cerebral infarction, unspecified: Secondary | ICD-10-CM | POA: Diagnosis not present

## 2020-04-27 DIAGNOSIS — L89151 Pressure ulcer of sacral region, stage 1: Secondary | ICD-10-CM | POA: Diagnosis present

## 2020-04-27 DIAGNOSIS — R6889 Other general symptoms and signs: Secondary | ICD-10-CM | POA: Diagnosis not present

## 2020-04-27 DIAGNOSIS — A4102 Sepsis due to Methicillin resistant Staphylococcus aureus: Secondary | ICD-10-CM

## 2020-04-27 DIAGNOSIS — A419 Sepsis, unspecified organism: Secondary | ICD-10-CM | POA: Diagnosis not present

## 2020-04-27 DIAGNOSIS — I69354 Hemiplegia and hemiparesis following cerebral infarction affecting left non-dominant side: Secondary | ICD-10-CM | POA: Diagnosis not present

## 2020-04-27 DIAGNOSIS — R7881 Bacteremia: Principal | ICD-10-CM | POA: Diagnosis present

## 2020-04-27 DIAGNOSIS — Z888 Allergy status to other drugs, medicaments and biological substances status: Secondary | ICD-10-CM

## 2020-04-27 DIAGNOSIS — Z20822 Contact with and (suspected) exposure to covid-19: Secondary | ICD-10-CM | POA: Diagnosis present

## 2020-04-27 DIAGNOSIS — E1142 Type 2 diabetes mellitus with diabetic polyneuropathy: Secondary | ICD-10-CM | POA: Diagnosis not present

## 2020-04-27 DIAGNOSIS — Z8261 Family history of arthritis: Secondary | ICD-10-CM

## 2020-04-27 DIAGNOSIS — I739 Peripheral vascular disease, unspecified: Secondary | ICD-10-CM | POA: Diagnosis not present

## 2020-04-27 DIAGNOSIS — N390 Urinary tract infection, site not specified: Secondary | ICD-10-CM | POA: Diagnosis not present

## 2020-04-27 DIAGNOSIS — I25118 Atherosclerotic heart disease of native coronary artery with other forms of angina pectoris: Secondary | ICD-10-CM | POA: Diagnosis not present

## 2020-04-27 DIAGNOSIS — R4182 Altered mental status, unspecified: Secondary | ICD-10-CM

## 2020-04-27 DIAGNOSIS — D509 Iron deficiency anemia, unspecified: Secondary | ICD-10-CM | POA: Diagnosis present

## 2020-04-27 DIAGNOSIS — L89152 Pressure ulcer of sacral region, stage 2: Secondary | ICD-10-CM

## 2020-04-27 DIAGNOSIS — I6389 Other cerebral infarction: Secondary | ICD-10-CM | POA: Diagnosis not present

## 2020-04-27 DIAGNOSIS — R410 Disorientation, unspecified: Secondary | ICD-10-CM | POA: Diagnosis not present

## 2020-04-27 DIAGNOSIS — R6520 Severe sepsis without septic shock: Secondary | ICD-10-CM

## 2020-04-27 DIAGNOSIS — M2550 Pain in unspecified joint: Secondary | ICD-10-CM | POA: Diagnosis not present

## 2020-04-27 DIAGNOSIS — E1110 Type 2 diabetes mellitus with ketoacidosis without coma: Secondary | ICD-10-CM | POA: Diagnosis not present

## 2020-04-27 DIAGNOSIS — B9562 Methicillin resistant Staphylococcus aureus infection as the cause of diseases classified elsewhere: Secondary | ICD-10-CM | POA: Diagnosis not present

## 2020-04-27 DIAGNOSIS — R1111 Vomiting without nausea: Secondary | ICD-10-CM | POA: Diagnosis not present

## 2020-04-27 DIAGNOSIS — Z885 Allergy status to narcotic agent status: Secondary | ICD-10-CM

## 2020-04-27 DIAGNOSIS — Z9109 Other allergy status, other than to drugs and biological substances: Secondary | ICD-10-CM

## 2020-04-27 DIAGNOSIS — Z794 Long term (current) use of insulin: Secondary | ICD-10-CM | POA: Diagnosis not present

## 2020-04-27 DIAGNOSIS — G319 Degenerative disease of nervous system, unspecified: Secondary | ICD-10-CM | POA: Diagnosis not present

## 2020-04-27 DIAGNOSIS — Z9181 History of falling: Secondary | ICD-10-CM

## 2020-04-27 DIAGNOSIS — G9341 Metabolic encephalopathy: Secondary | ICD-10-CM | POA: Diagnosis present

## 2020-04-27 DIAGNOSIS — J3489 Other specified disorders of nose and nasal sinuses: Secondary | ICD-10-CM | POA: Diagnosis not present

## 2020-04-27 DIAGNOSIS — L89159 Pressure ulcer of sacral region, unspecified stage: Secondary | ICD-10-CM

## 2020-04-27 DIAGNOSIS — Z83438 Family history of other disorder of lipoprotein metabolism and other lipidemia: Secondary | ICD-10-CM

## 2020-04-27 DIAGNOSIS — Z833 Family history of diabetes mellitus: Secondary | ICD-10-CM

## 2020-04-27 DIAGNOSIS — Z881 Allergy status to other antibiotic agents status: Secondary | ICD-10-CM

## 2020-04-27 DIAGNOSIS — R404 Transient alteration of awareness: Secondary | ICD-10-CM | POA: Diagnosis not present

## 2020-04-27 DIAGNOSIS — I361 Nonrheumatic tricuspid (valve) insufficiency: Secondary | ICD-10-CM | POA: Diagnosis not present

## 2020-04-27 DIAGNOSIS — Z743 Need for continuous supervision: Secondary | ICD-10-CM | POA: Diagnosis not present

## 2020-04-27 DIAGNOSIS — I499 Cardiac arrhythmia, unspecified: Secondary | ICD-10-CM | POA: Diagnosis not present

## 2020-04-27 DIAGNOSIS — N1831 Chronic kidney disease, stage 3a: Secondary | ICD-10-CM | POA: Diagnosis not present

## 2020-04-27 DIAGNOSIS — Z823 Family history of stroke: Secondary | ICD-10-CM

## 2020-04-27 LAB — BLOOD CULTURE ID PANEL (REFLEXED) - BCID2
A.calcoaceticus-baumannii: NOT DETECTED
Bacteroides fragilis: NOT DETECTED
Candida albicans: NOT DETECTED
Candida auris: NOT DETECTED
Candida glabrata: NOT DETECTED
Candida krusei: NOT DETECTED
Candida parapsilosis: NOT DETECTED
Candida tropicalis: NOT DETECTED
Cryptococcus neoformans/gattii: NOT DETECTED
Enterobacter cloacae complex: NOT DETECTED
Enterobacterales: NOT DETECTED
Enterococcus Faecium: NOT DETECTED
Enterococcus faecalis: NOT DETECTED
Escherichia coli: NOT DETECTED
Haemophilus influenzae: NOT DETECTED
Klebsiella aerogenes: NOT DETECTED
Klebsiella oxytoca: NOT DETECTED
Klebsiella pneumoniae: NOT DETECTED
Listeria monocytogenes: NOT DETECTED
Meth resistant mecA/C and MREJ: NOT DETECTED
Methicillin resistance mecA/C: DETECTED — AB
Neisseria meningitidis: NOT DETECTED
Proteus species: NOT DETECTED
Pseudomonas aeruginosa: NOT DETECTED
Salmonella species: NOT DETECTED
Serratia marcescens: NOT DETECTED
Staphylococcus aureus (BCID): DETECTED — AB
Staphylococcus epidermidis: DETECTED — AB
Staphylococcus lugdunensis: NOT DETECTED
Staphylococcus species: DETECTED — AB
Stenotrophomonas maltophilia: NOT DETECTED
Streptococcus agalactiae: NOT DETECTED
Streptococcus pneumoniae: NOT DETECTED
Streptococcus pyogenes: NOT DETECTED
Streptococcus species: NOT DETECTED

## 2020-04-27 LAB — CBC WITH DIFFERENTIAL/PLATELET
Abs Immature Granulocytes: 0.07 10*3/uL (ref 0.00–0.07)
Basophils Absolute: 0 10*3/uL (ref 0.0–0.1)
Basophils Relative: 0 %
Eosinophils Absolute: 0 10*3/uL (ref 0.0–0.5)
Eosinophils Relative: 0 %
HCT: 35.1 % — ABNORMAL LOW (ref 36.0–46.0)
Hemoglobin: 11.6 g/dL — ABNORMAL LOW (ref 12.0–15.0)
Immature Granulocytes: 1 %
Lymphocytes Relative: 3 %
Lymphs Abs: 0.4 10*3/uL — ABNORMAL LOW (ref 0.7–4.0)
MCH: 28.4 pg (ref 26.0–34.0)
MCHC: 33 g/dL (ref 30.0–36.0)
MCV: 85.8 fL (ref 80.0–100.0)
Monocytes Absolute: 0.6 10*3/uL (ref 0.1–1.0)
Monocytes Relative: 5 %
Neutro Abs: 10.4 10*3/uL — ABNORMAL HIGH (ref 1.7–7.7)
Neutrophils Relative %: 91 %
Platelets: 265 10*3/uL (ref 150–400)
RBC: 4.09 MIL/uL (ref 3.87–5.11)
RDW: 15 % (ref 11.5–15.5)
WBC: 11.4 10*3/uL — ABNORMAL HIGH (ref 4.0–10.5)
nRBC: 0 % (ref 0.0–0.2)

## 2020-04-27 LAB — URINALYSIS, COMPLETE (UACMP) WITH MICROSCOPIC
Bilirubin Urine: NEGATIVE
Glucose, UA: >=500 mg/dL — AB
Ketones, ur: 20 mg/dL — AB
Nitrite: NEGATIVE
Protein, ur: >=300 mg/dL — AB
Specific Gravity, Urine: 1.018 (ref 1.005–1.030)
WBC, UA: >50 WBC/hpf — ABNORMAL HIGH (ref 0–5)
pH: 5 (ref 5.0–8.0)

## 2020-04-27 LAB — PROTIME-INR
INR: 1 (ref 0.8–1.2)
Prothrombin Time: 12.4 seconds (ref 11.4–15.2)

## 2020-04-27 LAB — BASIC METABOLIC PANEL
Anion gap: 19 — ABNORMAL HIGH (ref 5–15)
Anion gap: 10 (ref 5–15)
BUN: 20 mg/dL (ref 8–23)
BUN: 17 mg/dL (ref 8–23)
CO2: 15 mmol/L — ABNORMAL LOW (ref 22–32)
CO2: 22 mmol/L (ref 22–32)
Calcium: 8.1 mg/dL — ABNORMAL LOW (ref 8.9–10.3)
Calcium: 8 mg/dL — ABNORMAL LOW (ref 8.9–10.3)
Chloride: 109 mmol/L (ref 98–111)
Chloride: 108 mmol/L (ref 98–111)
Creatinine, Ser: 1.71 mg/dL — ABNORMAL HIGH (ref 0.44–1.00)
Creatinine, Ser: 1.44 mg/dL — ABNORMAL HIGH (ref 0.44–1.00)
GFR, Estimated: 31 mL/min — ABNORMAL LOW (ref >60)
GFR, Estimated: 38 mL/min — ABNORMAL LOW (ref >60)
Glucose, Bld: 182 mg/dL — ABNORMAL HIGH (ref 70–99)
Glucose, Bld: 172 mg/dL — ABNORMAL HIGH (ref 70–99)
Potassium: 3.5 mmol/L (ref 3.5–5.1)
Potassium: 3 mmol/L — ABNORMAL LOW (ref 3.5–5.1)
Sodium: 143 mmol/L (ref 135–145)
Sodium: 140 mmol/L (ref 135–145)

## 2020-04-27 LAB — RESPIRATORY PANEL BY RT PCR (FLU A&B, COVID)
Influenza A by PCR: NEGATIVE
Influenza B by PCR: NEGATIVE
SARS Coronavirus 2 by RT PCR: NEGATIVE

## 2020-04-27 LAB — GLUCOSE, CAPILLARY
Glucose-Capillary: 107 mg/dL — ABNORMAL HIGH (ref 70–99)
Glucose-Capillary: 123 mg/dL — ABNORMAL HIGH (ref 70–99)
Glucose-Capillary: 138 mg/dL — ABNORMAL HIGH (ref 70–99)
Glucose-Capillary: 139 mg/dL — ABNORMAL HIGH (ref 70–99)
Glucose-Capillary: 157 mg/dL — ABNORMAL HIGH (ref 70–99)
Glucose-Capillary: 160 mg/dL — ABNORMAL HIGH (ref 70–99)
Glucose-Capillary: 163 mg/dL — ABNORMAL HIGH (ref 70–99)
Glucose-Capillary: 172 mg/dL — ABNORMAL HIGH (ref 70–99)
Glucose-Capillary: 175 mg/dL — ABNORMAL HIGH (ref 70–99)
Glucose-Capillary: 197 mg/dL — ABNORMAL HIGH (ref 70–99)
Glucose-Capillary: 300 mg/dL — ABNORMAL HIGH (ref 70–99)

## 2020-04-27 LAB — BLOOD GAS, VENOUS
Acid-base deficit: 10.8 mmol/L — ABNORMAL HIGH (ref 0.0–2.0)
Bicarbonate: 12.7 mmol/L — ABNORMAL LOW (ref 20.0–28.0)
O2 Saturation: 99.5 %
Patient temperature: 37
pCO2, Ven: 22 mmHg — ABNORMAL LOW (ref 44.0–60.0)
pH, Ven: 7.37 (ref 7.250–7.430)
pO2, Ven: 168 mmHg — ABNORMAL HIGH (ref 32.0–45.0)

## 2020-04-27 LAB — COMPREHENSIVE METABOLIC PANEL
ALT: 14 U/L (ref 0–44)
AST: 24 U/L (ref 15–41)
Albumin: 3.5 g/dL (ref 3.5–5.0)
Alkaline Phosphatase: 67 U/L (ref 38–126)
Anion gap: 23 — ABNORMAL HIGH (ref 5–15)
BUN: 22 mg/dL (ref 8–23)
CO2: 14 mmol/L — ABNORMAL LOW (ref 22–32)
Calcium: 8.6 mg/dL — ABNORMAL LOW (ref 8.9–10.3)
Chloride: 105 mmol/L (ref 98–111)
Creatinine, Ser: 1.9 mg/dL — ABNORMAL HIGH (ref 0.44–1.00)
GFR, Estimated: 27 mL/min — ABNORMAL LOW (ref >60)
Glucose, Bld: 448 mg/dL — ABNORMAL HIGH (ref 70–99)
Potassium: 3.6 mmol/L (ref 3.5–5.1)
Sodium: 142 mmol/L (ref 135–145)
Total Bilirubin: 2.1 mg/dL — ABNORMAL HIGH (ref 0.3–1.2)
Total Protein: 7.4 g/dL (ref 6.5–8.1)

## 2020-04-27 LAB — LACTIC ACID, PLASMA
Lactic Acid, Venous: 2.1 mmol/L (ref 0.5–1.9)
Lactic Acid, Venous: 2.3 mmol/L (ref 0.5–1.9)
Lactic Acid, Venous: 4.7 mmol/L (ref 0.5–1.9)
Lactic Acid, Venous: 2 mmol/L (ref 0.5–1.9)
Lactic Acid, Venous: 0.8 mmol/L (ref 0.5–1.9)

## 2020-04-27 LAB — HEMOGLOBIN A1C
Hgb A1c MFr Bld: 9.6 % — ABNORMAL HIGH (ref 4.8–5.6)
Mean Plasma Glucose: 228.82 mg/dL

## 2020-04-27 LAB — APTT: aPTT: 27 seconds (ref 24–36)

## 2020-04-27 LAB — URINE CULTURE

## 2020-04-27 LAB — MAGNESIUM
Magnesium: 1.3 mg/dL — ABNORMAL LOW (ref 1.7–2.4)
Magnesium: 1.4 mg/dL — ABNORMAL LOW (ref 1.7–2.4)

## 2020-04-27 LAB — BETA-HYDROXYBUTYRIC ACID
Beta-Hydroxybutyric Acid: 7.11 mmol/L — ABNORMAL HIGH (ref 0.05–0.27)
Beta-Hydroxybutyric Acid: 1.86 mmol/L — ABNORMAL HIGH (ref 0.05–0.27)
Beta-Hydroxybutyric Acid: 0.39 mmol/L — ABNORMAL HIGH (ref 0.05–0.27)

## 2020-04-27 MED ORDER — POTASSIUM CHLORIDE 20 MEQ PO PACK: 40.0000 meq | PACK | Freq: Once | ORAL | Status: DC

## 2020-04-27 MED ORDER — SODIUM CHLORIDE 0.9 % IV SOLN
1.0000 g | INTRAVENOUS | Status: DC
  Administered 2020-04-27 (×2): 1 g via INTRAVENOUS
  Filled 2020-04-27 (×3): qty 10

## 2020-04-27 MED ORDER — DEXTROSE 50 % IV SOLN: 0.0000 mL | INTRAVENOUS | Status: DC | PRN

## 2020-04-27 MED ORDER — ASPIRIN 81 MG PO CHEW
81.0000 mg | CHEWABLE_TABLET | Freq: Every day | ORAL | Status: DC
  Administered 2020-04-27 – 2020-05-09 (×11): 81 mg via ORAL
  Filled 2020-04-27 (×12): qty 1

## 2020-04-27 MED ORDER — HYDRALAZINE HCL 20 MG/ML IJ SOLN
10.0000 mg | Freq: Four times a day (QID) | INTRAMUSCULAR | Status: DC | PRN
  Administered 2020-04-27 – 2020-05-07 (×4): 10 mg via INTRAVENOUS
  Filled 2020-04-27 (×4): qty 1

## 2020-04-27 MED ORDER — ATORVASTATIN CALCIUM 20 MG PO TABS
20.0000 mg | ORAL_TABLET | Freq: Every day | ORAL | Status: DC
  Administered 2020-04-28 – 2020-05-08 (×11): 20 mg via ORAL
  Filled 2020-04-27 (×12): qty 1

## 2020-04-27 MED ORDER — LACTATED RINGERS IV SOLN: INTRAVENOUS | Status: DC

## 2020-04-27 MED ORDER — ENALAPRIL MALEATE 20 MG PO TABS
20.0000 mg | ORAL_TABLET | Freq: Two times a day (BID) | ORAL | Status: DC
  Administered 2020-04-27 – 2020-05-02 (×11): 20 mg via ORAL
  Filled 2020-04-27: qty 1
  Filled 2020-04-27: qty 2
  Filled 2020-04-27 (×8): qty 1
  Filled 2020-04-27: qty 2
  Filled 2020-04-27 (×2): qty 1

## 2020-04-27 MED ORDER — LACTATED RINGERS IV BOLUS: 20.0000 mL/kg | Freq: Once | INTRAVENOUS | Status: DC

## 2020-04-27 MED ORDER — SODIUM CHLORIDE 0.9 % IV SOLN: 1.0000 g | INTRAVENOUS | Status: DC

## 2020-04-27 MED ORDER — ALUM & MAG HYDROXIDE-SIMETH 200-200-20 MG/5ML PO SUSP
30.0000 mL | ORAL | Status: DC | PRN
  Administered 2020-05-06: 30 mL via ORAL
  Filled 2020-04-27: qty 30

## 2020-04-27 MED ORDER — HYDROCODONE-ACETAMINOPHEN 5-325 MG PO TABS
2.0000 | ORAL_TABLET | Freq: Four times a day (QID) | ORAL | Status: DC | PRN
  Administered 2020-04-28: 2 via ORAL
  Filled 2020-04-27: qty 2

## 2020-04-27 MED ORDER — LACTATED RINGERS IV BOLUS
1000.0000 mL | Freq: Once | INTRAVENOUS | Status: AC
  Administered 2020-04-27: 1000 mL via INTRAVENOUS

## 2020-04-27 MED ORDER — NITROGLYCERIN 0.4 MG SL SUBL: 0.4000 mg | SUBLINGUAL_TABLET | SUBLINGUAL | Status: DC | PRN

## 2020-04-27 MED ORDER — VANCOMYCIN HCL 750 MG/150ML IV SOLN
750.0000 mg | Freq: Two times a day (BID) | INTRAVENOUS | Status: DC
  Administered 2020-04-28 – 2020-04-29 (×4): 750 mg via INTRAVENOUS
  Filled 2020-04-27 (×5): qty 150

## 2020-04-27 MED ORDER — DEXTROSE IN LACTATED RINGERS 5 % IV SOLN: INTRAVENOUS | Status: DC

## 2020-04-27 MED ORDER — ONDANSETRON HCL 4 MG/2ML IJ SOLN
4.0000 mg | INTRAMUSCULAR | Status: DC | PRN
  Administered 2020-04-29: 4 mg via INTRAVENOUS
  Filled 2020-04-27: qty 2

## 2020-04-27 MED ORDER — ENOXAPARIN SODIUM 40 MG/0.4ML ~~LOC~~ SOLN
40.0000 mg | SUBCUTANEOUS | Status: DC
  Administered 2020-04-27 – 2020-04-28 (×2): 40 mg via SUBCUTANEOUS
  Filled 2020-04-27 (×3): qty 0.4

## 2020-04-27 MED ORDER — AMLODIPINE BESYLATE 10 MG PO TABS
10.0000 mg | ORAL_TABLET | Freq: Every day | ORAL | Status: DC
  Administered 2020-04-27 – 2020-05-09 (×11): 10 mg via ORAL
  Filled 2020-04-27: qty 1
  Filled 2020-04-27: qty 2
  Filled 2020-04-27 (×10): qty 1

## 2020-04-27 MED ORDER — INSULIN GLARGINE 100 UNIT/ML ~~LOC~~ SOLN
7.0000 [IU] | Freq: Every day | SUBCUTANEOUS | Status: DC
  Administered 2020-04-27 – 2020-04-29 (×3): 7 [IU] via SUBCUTANEOUS
  Filled 2020-04-27 (×3): qty 0.07

## 2020-04-27 MED ORDER — MAGNESIUM SULFATE 2 GM/50ML IV SOLN
2.0000 g | Freq: Once | INTRAVENOUS | Status: DC
  Filled 2020-04-27: qty 50

## 2020-04-27 MED ORDER — ACETAMINOPHEN 325 MG PO TABS
ORAL_TABLET | ORAL | Status: AC
  Administered 2020-04-27: 650 mg via ORAL
  Filled 2020-04-27: qty 2

## 2020-04-27 MED ORDER — LABETALOL HCL 5 MG/ML IV SOLN
10.0000 mg | INTRAVENOUS | Status: DC | PRN
  Administered 2020-04-28: 10 mg via INTRAVENOUS
  Filled 2020-04-27 (×2): qty 4

## 2020-04-27 MED ORDER — MAGNESIUM SULFATE IN D5W 1-5 GM/100ML-% IV SOLN
1.0000 g | INTRAVENOUS | Status: AC
  Administered 2020-04-27 (×2): 1 g via INTRAVENOUS
  Filled 2020-04-27 (×2): qty 100

## 2020-04-27 MED ORDER — POTASSIUM CHLORIDE 10 MEQ/100ML IV SOLN: 10.0000 meq | INTRAVENOUS | Status: DC

## 2020-04-27 MED ORDER — ACETAMINOPHEN 325 MG PO TABS
650.0000 mg | ORAL_TABLET | ORAL | Status: DC | PRN
  Administered 2020-05-07 – 2020-05-08 (×3): 650 mg via ORAL
  Filled 2020-04-27 (×3): qty 2

## 2020-04-27 MED ORDER — SODIUM CHLORIDE 0.9 % IV SOLN: INTRAVENOUS | Status: DC

## 2020-04-27 MED ORDER — VANCOMYCIN HCL 2000 MG/400ML IV SOLN
2000.0000 mg | Freq: Once | INTRAVENOUS | Status: AC
  Administered 2020-04-27: 2000 mg via INTRAVENOUS
  Filled 2020-04-27: qty 400

## 2020-04-27 MED ORDER — INSULIN REGULAR(HUMAN) IN NACL 100-0.9 UT/100ML-% IV SOLN
INTRAVENOUS | Status: DC
  Administered 2020-04-27: 11.5 [IU]/h via INTRAVENOUS
  Filled 2020-04-27: qty 100

## 2020-04-27 MED ORDER — INSULIN REGULAR(HUMAN) IN NACL 100-0.9 UT/100ML-% IV SOLN: INTRAVENOUS | Status: DC

## 2020-04-27 MED ORDER — LACTATED RINGERS IV BOLUS (SEPSIS)
1000.0000 mL | Freq: Once | INTRAVENOUS | Status: AC
  Administered 2020-04-27: 1000 mL via INTRAVENOUS

## 2020-04-27 MED ORDER — INSULIN ASPART 100 UNIT/ML ~~LOC~~ SOLN
0.0000 [IU] | Freq: Three times a day (TID) | SUBCUTANEOUS | Status: DC
  Administered 2020-04-27 (×2): 1 [IU] via SUBCUTANEOUS
  Administered 2020-04-28: 2 [IU] via SUBCUTANEOUS
  Administered 2020-04-28: 5 [IU] via SUBCUTANEOUS
  Administered 2020-04-28: 1 [IU] via SUBCUTANEOUS
  Administered 2020-04-29: 7 [IU] via SUBCUTANEOUS
  Administered 2020-04-29 (×2): 2 [IU] via SUBCUTANEOUS
  Administered 2020-04-30: 5 [IU] via SUBCUTANEOUS
  Administered 2020-04-30: 2 [IU] via SUBCUTANEOUS
  Administered 2020-04-30: 9 [IU] via SUBCUTANEOUS
  Administered 2020-05-01: 5 [IU] via SUBCUTANEOUS
  Administered 2020-05-01: 9 [IU] via SUBCUTANEOUS
  Administered 2020-05-01: 3 [IU] via SUBCUTANEOUS
  Administered 2020-05-02 (×2): 7 [IU] via SUBCUTANEOUS
  Administered 2020-05-02: 5 [IU] via SUBCUTANEOUS
  Administered 2020-05-03: 7 [IU] via SUBCUTANEOUS
  Administered 2020-05-03 (×2): 3 [IU] via SUBCUTANEOUS
  Administered 2020-05-04: 2 [IU] via SUBCUTANEOUS
  Administered 2020-05-04: 1 [IU] via SUBCUTANEOUS
  Administered 2020-05-04: 2 [IU] via SUBCUTANEOUS
  Administered 2020-05-05: 1 [IU] via SUBCUTANEOUS
  Administered 2020-05-05: 5 [IU] via SUBCUTANEOUS
  Administered 2020-05-06: 3 [IU] via SUBCUTANEOUS
  Administered 2020-05-06: 7 [IU] via SUBCUTANEOUS
  Administered 2020-05-06: 3 [IU] via SUBCUTANEOUS
  Administered 2020-05-07: 1 [IU] via SUBCUTANEOUS
  Administered 2020-05-07: 5 [IU] via SUBCUTANEOUS
  Administered 2020-05-07: 2 [IU] via SUBCUTANEOUS
  Administered 2020-05-08: 1 [IU] via SUBCUTANEOUS
  Administered 2020-05-08: 3 [IU] via SUBCUTANEOUS
  Filled 2020-04-27 (×33): qty 1

## 2020-04-27 MED ORDER — DEXTROSE-NACL 5-0.45 % IV SOLN: INTRAVENOUS | Status: DC

## 2020-04-27 MED ORDER — METOPROLOL SUCCINATE ER 100 MG PO TB24
100.0000 mg | ORAL_TABLET | Freq: Every day | ORAL | Status: DC
  Administered 2020-04-27 – 2020-05-09 (×11): 100 mg via ORAL
  Filled 2020-04-27 (×2): qty 1
  Filled 2020-04-27: qty 2
  Filled 2020-04-27 (×9): qty 1

## 2020-04-27 MED ORDER — POTASSIUM CHLORIDE 10 MEQ/100ML IV SOLN
10.0000 meq | INTRAVENOUS | Status: AC
  Administered 2020-04-27 (×2): 10 meq via INTRAVENOUS
  Filled 2020-04-27: qty 100

## 2020-04-27 MED ORDER — DOCUSATE SODIUM 100 MG PO CAPS: 100.0000 mg | ORAL_CAPSULE | Freq: Two times a day (BID) | ORAL | Status: DC | PRN

## 2020-04-27 MED ORDER — HYDRALAZINE HCL 50 MG PO TABS
100.0000 mg | ORAL_TABLET | Freq: Two times a day (BID) | ORAL | Status: DC
  Administered 2020-04-27 – 2020-05-09 (×22): 100 mg via ORAL
  Filled 2020-04-27 (×24): qty 2

## 2020-04-27 NOTE — ED Notes (Signed)
Report given to Jovita Gamma, Therapist, sports. Pt repositioned in bed. RN aware of lactic level increase, provider made aware.

## 2020-04-27 NOTE — Progress Notes (Signed)
CODE SEPSIS - PHARMACY COMMUNICATION  **Broad Spectrum Antibiotics should be administered within 1 hour of Sepsis diagnosis**  Time Code Sepsis Called/Page Received: 0234  Antibiotics Ordered: Rocephin  Time of 1st antibiotic administration: 0327  Additional action taken by pharmacy: n/a   If necessary, Name of Provider/Nurse Contacted: n/a    Ena Dawley ,PharmD Clinical Pharmacist  04/27/2020  2:45 AM

## 2020-04-27 NOTE — Progress Notes (Signed)
PROGRESS NOTE    Susan House  RFF:[id number] DOB: August 04, 1955 DOA: 04/27/2020 PCP: Jerrol Banana., MD   Assessment & Plan:   Active Problems:   DKA (diabetic ketoacidosis) ([place])   DKA: secondary to poorly controlled DM2.  Continue on insulin drip protocol. Continue on IVFs  Sepsis: presented w/ tachycardia, tachypnea & bacteremia & likely UTI. Continue on IV rocephin. Urine cx is pending  Bacteremia: blood cxs growing likely MRSA, sens pending. Continue on IV rocephin, vanco. ID automatic consulted as per pharmacy  Encephalopathy: likely metabolic & secondary to above. CT head neg for acute intracranial findings, but atrophy & chronic infarcts present. NPO until speech can see the pt   UTI: urine cx is pending. Continue on IV rocephin   AKI: likely prerenal from dehydration. Continue on IVFs.   HTN: will continue on amlodipine, metoprolol, enalapril & hydralazine   HLD: continue on statin   GERD: continue on PPI    DVT prophylaxis: lovenox  Code Status: full  Family Communication: Disposition Plan: depends on PT/OT (not consulted yet)    Consultants:   ID   Procedures:   Antimicrobials:  Subjective: Pt is very confused. Pt is unable to answer questions appropriately   Objective: Vitals:   04/27/20 0500 04/27/20 0501 04/27/20 0600 04/27/20 0655  BP: (!) 126/99 (!) 126/99 (!) 151/95 (!) 157/86  Pulse: 100  (!) 110 87  Resp: (!) 26  18 (!) 22  Temp:    (!) 100.5 F (38.1 C)  TempSrc:    Rectal  SpO2: 99%  99% 96%  Weight:      Height:       No intake or output data in the 24 hours ending 04/27/20 0815 Filed Weights   04/27/20 0037  Weight: 91 kg    Examination:  General exam: Appears confused. Morbidly obese Respiratory system: diminished breath sounds b/l  Cardiovascular system: S1 & S2 +. No ubs, gallops or clicks. No pedal edema. Gastrointestinal system: Abdomen is obese, soft and nontender. Hypoactive bowel sounds Central  nervous system: Awake. Unable to answer questions appropriately  Psychiatry: Judgement and insight appear abnormal. Flat mood and affect      Data Reviewed: I have personally reviewed following labs and imaging studies  CBC: Recent Labs  Lab 04/27/20 0055  WBC 11.4*  NEUTROABS 10.4*  HGB 11.6*  HCT 35.1*  MCV 85.8  PLT 357   Basic Metabolic Panel: Recent Labs  Lab 04/27/20 0055 04/27/20 0522  NA 142 143  K 3.6 3.5  CL 105 109  CO2 14* 15*  GLUCOSE 448* 182*  BUN 22 20  CREATININE 1.90* 1.71*  CALCIUM 8.6* 8.1*  MG 1.3*  --    GFR: Estimated Creatinine Clearance: 35.6 mL/min (A) (by C-G formula based on SCr of 1.71 mg/dL (H)). Liver Function Tests: Recent Labs  Lab 04/27/20 0055  AST 24  ALT 14  ALKPHOS 67  BILITOT 2.1*  PROT 7.4  ALBUMIN 3.5   No results for input(s): LIPASE, AMYLASE in the last 168 hours. No results for input(s): AMMONIA in the last 168 hours. Coagulation Profile: Recent Labs  Lab 04/27/20 0055  INR 1.0   Cardiac Enzymes: No results for input(s): CKTOTAL, CKMB, CKMBINDEX, TROPONINI in the last 168 hours. BNP (last 3 results) No results for input(s): PROBNP in the last 8760 hours. HbA1C: No results for input(s): HGBA1C in the last 72 hours. CBG: Recent Labs  Lab 04/27/20 0320 04/27/20 0432 04/27/20 0512 04/27/20 0600  04/27/20 0710  GLUCAP 300* 197* 175* 160* 172*   Lipid Profile: No results for input(s): CHOL, HDL, LDLCALC, TRIG, CHOLHDL, LDLDIRECT in the last 72 hours. Thyroid Function Tests: No results for input(s): TSH, T4TOTAL, FREET4, T3FREE, THYROIDAB in the last 72 hours. Anemia Panel: No results for input(s): VITAMINB12, FOLATE, FERRITIN, TIBC, IRON, RETICCTPCT in the last 72 hours. Sepsis Labs: Recent Labs  Lab 04/27/20 0055 04/27/20 0343 04/27/20 0610  LATICACIDVEN 2.1* 2.3* 4.7*    Recent Results (from the past 240 hour(s))  Blood Culture (routine x 2)     Status: None (Preliminary result)   Collection  Time: 04/27/20 12:55 AM   Specimen: BLOOD  Result Value Ref Range Status   Specimen Description BLOOD LEFT AC  Final   Special Requests   Final    BOTTLES DRAWN AEROBIC AND ANAEROBIC Blood Culture results may not be optimal due to an excessive volume of blood received in culture bottles   Culture   Final    NO GROWTH < 12 HOURS Performed at [facility], [address] [place], [place] [address]    Report Status PENDING  Incomplete  Blood Culture (routine x 2)     Status: None (Preliminary result)   Collection Time: 04/27/20 12:55 AM   Specimen: BLOOD  Result Value Ref Range Status   Specimen Description BLOOD RIGTH HAND  Final   Special Requests   Final    BOTTLES DRAWN AEROBIC AND ANAEROBIC Blood Culture adequate volume   Culture   Final    NO GROWTH < 12 HOURS Performed at [facility], [address], [place], [place] [address]    Report Status PENDING  Incomplete  Respiratory Panel by RT PCR (Flu A&B, Covid) - Nasopharyngeal Swab     Status: None   Collection Time: 04/27/20 12:55 AM   Specimen: Nasopharyngeal Swab  Result Value Ref Range Status   SARS Coronavirus 2 by RT PCR NEGATIVE NEGATIVE Final    Comment: (NOTE) SARS-CoV-2 target nucleic acids are NOT DETECTED.  The SARS-CoV-2 RNA is generally detectable in upper respiratoy specimens during the acute phase of infection. The lowest concentration of SARS-CoV-2 viral copies this assay can detect is 131 copies/mL. A negative result does not preclude SARS-Cov-2 infection and should not be used as the sole basis for treatment or other patient management decisions. A negative result may occur with  improper specimen collection/handling, submission of specimen other than nasopharyngeal swab, presence of viral mutation(s) within the areas targeted by this assay, and inadequate number of viral copies (<131 copies/mL). A negative result must be combined with clinical observations, patient history, and  epidemiological information. The expected result is Negative.  Fact Sheet for Patients:  PinkCheek.be  Fact Sheet for Healthcare Providers:  GravelBags.it  This test is no t yet approved or cleared by the [place] FDA and  has been authorized for detection and/or diagnosis of SARS-CoV-2 by FDA under an Emergency Use Authorization (EUA). This EUA will remain  in effect (meaning this test can be used) for the duration of the COVID-19 declaration under Section 564(b)(1) of the Act, 21 U.S.C. section 360bbb-3(b)(1), unless the authorization is terminated or revoked sooner.     Influenza A by PCR NEGATIVE NEGATIVE Final   Influenza B by PCR NEGATIVE NEGATIVE Final    Comment: (NOTE) The Xpert Xpress SARS-CoV-2/FLU/RSV assay is intended as an aid in  the diagnosis of influenza from Nasopharyngeal swab specimens and  should not be used as a  sole basis for treatment. Nasal washings and  aspirates are unacceptable for Xpert Xpress SARS-CoV-2/FLU/RSV  testing.  Fact Sheet for Patients: PinkCheek.be  Fact Sheet for Healthcare Providers: GravelBags.it  This test is not yet approved or cleared by the [place] FDA and  has been authorized for detection and/or diagnosis of SARS-CoV-2 by  FDA under an Emergency Use Authorization (EUA). This EUA will remain  in effect (meaning this test can be used) for the duration of the  Covid-19 declaration under Section 564(b)(1) of the Act, 21  U.S.C. section 360bbb-3(b)(1), unless the authorization is  terminated or revoked. Performed at [facility], [address], [place], [place] [address]          Radiology Studies: DG Chest [place] 1 View  Result Date: 04/27/2020 CLINICAL DATA:  Altered mental status EXAM: PORTABLE CHEST 1 VIEW COMPARISON:  12/23/2019 FINDINGS: The heart size and mediastinal contours are  within normal limits. Both lungs are clear. The visualized skeletal structures are unremarkable. Bulky calcification of the mitral valve annulus unchanged. IMPRESSION: No active disease. Electronically Signed   By: Ulyses Jarred M.D.   On: 04/27/2020 01:28        Scheduled Meds: . amLODipine  10 mg Oral Daily  . aspirin  81 mg Oral Daily  . atorvastatin  20 mg Oral q1800  . enalapril  20 mg Oral BID  . enoxaparin (LOVENOX) injection  40 mg Subcutaneous Q24H  . hydrALAZINE  100 mg Oral BID  . metoprolol succinate  100 mg Oral Daily  . potassium chloride  40 mEq Oral Once   Continuous Infusions: . sodium chloride 125 mL/hr at 04/27/20 0435  . cefTRIAXone (ROCEPHIN)  IV Stopped (04/27/20 0419)  . dextrose 5% lactated ringers 125 mL/hr at 04/27/20 0443  . insulin 1.7 Units/hr (04/27/20 0713)  . lactated ringers Stopped (04/27/20 0449)  . lactated ringers 125 mL/hr at 04/27/20 0370     LOS: 0 days    Time spent: 33 mins     Wyvonnia Dusky, MD [facility] Pager [phone]  If 7PM-7AM, please contact night-coverage [email or web address] 04/27/2020, 8:15 AM

## 2020-04-27 NOTE — Progress Notes (Signed)
Elink Code Sepsis Note:  Second LA went up to 2.3 from 2.1, messaged ED RN to have MD put in a scheduled LA in two hours. Had IVF, and ABX.   Tempie Gibeault eLink RN

## 2020-04-27 NOTE — ED Triage Notes (Signed)
Pt to ED from home via EMS for altered mental status, lethargy, and weakness. Vomiting at home. Pt unable to answer orientation questions, however pt reacts to her name being called. Follows commands. Alert.Tremors noted. Pt answering questions in moans rather than words.

## 2020-04-27 NOTE — ED Notes (Signed)
Date and time results received: 04/27/20 0659 (use smartphrase ".now" to insert current time)  Test: Lactic Critical Value: 4.34mmol/L  Name of Provider Notified: NP Randol Kern  Orders Received? Or Actions Taken?:  Awaiting orders

## 2020-04-27 NOTE — Consult Note (Addendum)
NAME: Susan House  DOB: 01/13/1956  MRN: [id number]  Date/Time: 04/27/2020 4:40 PM  REQUESTING PROVIDER: Sidney Ace Subjective:  REASON FOR CONSULT: Bacteremia No history available from patient  Susan House is a 64 y.o. female with a history of hypertension, CVA, diabetes mellitus with CKD, hyperlipidemia, seizure as late effect of CVA, being treated for recurrent UTI.fusion lumbar spine with hardware  Patient presented from home on 04/27/2020 by EMS for altered mental status lethargy and weakness.  Patient was vomiting at home.  In the ED temp 99.9, BP 167/92, HR 126, RR 26 Labs showed a Glucose of 446 , Co2 of 14, AG of 23, Beta hydroxybutrate of 7.1, cr of 1.901lactate 2.1, WBC 11.4 ( N 91%) UA > 50 WBC Blood culture sent  CT head showed Generalized cerebral atrophy. Redemonstrated large chronic infarct affecting the left basal ganglia and radiating white matter tracts, as well as ventral left thalamus. Associated ex vacuo dilatation of the left lateral ventricle.Redemonstrated chronic lacunar infarct within the right thalamocapsular junction. CXR no active disease was started on ceftriaxone for possible UTI. Blood culture 2/4 Staph aureus . I am seeing the patient for the management of bacteremia   Past Medical History:  Diagnosis Date  . Allergy   . Arthritis   . Atypical chest pain    a. Reportedly nl cath in the past; b. 12/2009 MV: No ischemia/infarct. EF 87%.  . Carotid arterial disease ([place])    a. 0/0174 CTA head/neck: LICA 94W, RICA 96-->PRF rx.  . Cellulitis and abscess of face   . Chronic Dizziness   . Coronary artery calcification seen on CT scan    a. 10/2016 CTA chest: Atherosclerotic calcifications Ao, cors, and prox great vessels.  . Depression   . Diabetes mellitus ([place])   . Diastolic dysfunction    a. 2008 Echo: Nl EF; b. 2011 Echo: Nl EF; c. 06/2016 Echo: EF 55-60%, Gr1DD; d. 03/2018 Echo: EF 55-60%, no rwma, Gr1DD, mod dil LA.  . Edema   .  GERD (gastroesophageal reflux disease)   . Headache   . Hematuria   . Hyperlipidemia   . Hypertension   . IBS (irritable bowel syndrome)   . Migraine   . Morbid obesity ([place])   . Nocturia   . Possible Seizure ([place])    a. 10/2016 ? sz. MRI neg for stroke.  . Stroke ([place]) 2000  . TIA (transient ischemic attack) 2010 & 2019   a. 2010; b. 03/2018 Left sided wkns-->MRI neg for stroke. CTA head/neck: Right A2 cut off/occlusion w/ collats extending over R cerebral convexity. LICA 16B, RICA 45, RSubclavian 50->med rx.  . Urgency of micturation   . Urinary frequency   . Urinary incontinence     Past Surgical History:  Procedure Laterality Date  . ABDOMINAL HYSTERECTOMY    . ABDOMINOPLASTY    . APPENDECTOMY    . BACK SURGERY     extensive spinal fusion  . carpel tunnel surgery    . CATARACT EXTRACTION     x 2  . CESAREAN SECTION    . CHOLECYSTECTOMY    . COLONOSCOPY  2010  . COLONOSCOPY WITH PROPOFOL N/A 07/11/2016   Procedure: COLONOSCOPY WITH PROPOFOL;  Surgeon: Lucilla Lame, MD;  Location: [facility] ENDOSCOPY;  Service: Endoscopy;  Laterality: N/A;  . COLONOSCOPY WITH PROPOFOL N/A 03/26/2018   Procedure: COLONOSCOPY WITH PROPOFOL;  Surgeon: Lucilla Lame, MD;  Location: [facility] ENDOSCOPY;  Service: Endoscopy;  Laterality: N/A;  . ESOPHAGOGASTRODUODENOSCOPY (EGD) WITH  PROPOFOL N/A 03/26/2018   Procedure: ESOPHAGOGASTRODUODENOSCOPY (EGD) WITH PROPOFOL;  Surgeon: Lucilla Lame, MD;  Location: [facility] ENDOSCOPY;  Service: Endoscopy;  Laterality: N/A;  . EYE SURGERY    . HERNIA REPAIR    . SHOULDER SURGERY    . TOE SURGERY    . UPPER GI ENDOSCOPY    . WRIST FRACTURE SURGERY Left     Social History   Socioeconomic History  . Marital status: Married    Spouse name: Susan House   . Number of children: 2  . Years of education: 32  . Highest education level: Not on file  Occupational History  . Occupation: Disabled   . Occupation: retired  Tobacco Use  . Smoking status: Never Smoker  . Smokeless  tobacco: Never Used  Vaping Use  . Vaping Use: Never used  Substance and Sexual Activity  . Alcohol use: No  . Drug use: No  . Sexual activity: Never  Other Topics Concern  . Not on file  Social History Narrative   Patient lives in [place] at home with husband Susan House.    Patient has 2 children and 2 step children.    Patient has 12+ years of education.    Patient is Disabled since stroke in 2000.   Social Determinants of Health   Financial Resource Strain:   . Difficulty of Paying Living Expenses: Not on file  Food Insecurity:   . Worried About Charity fundraiser in the Last Year: Not on file  . Ran Out of Food in the Last Year: Not on file  Transportation Needs:   . Lack of Transportation (Medical): Not on file  . Lack of Transportation (Non-Medical): Not on file  Physical Activity:   . Days of Exercise per Week: Not on file  . Minutes of Exercise per Session: Not on file  Stress:   . Feeling of Stress : Not on file  Social Connections:   . Frequency of Communication with Friends and Family: Not on file  . Frequency of Social Gatherings with Friends and Family: Not on file  . Attends Religious Services: Not on file  . Active Member of Clubs or Organizations: Not on file  . Attends Archivist Meetings: Not on file  . Marital Status: Not on file  Intimate Partner Violence:   . Fear of Current or Ex-Partner: Not on file  . Emotionally Abused: Not on file  . Physically Abused: Not on file  . Sexually Abused: Not on file    Family History  Problem Relation Age of Onset  . Hyperlipidemia Mother   . Arrhythmia Mother        WPW  . Hypertension Mother   . Rheum arthritis Mother   . Heart disease Mother   . Heart attack Father 47  . Hyperlipidemia Father   . Hypertension Father   . Stroke Father   . Diabetes Father   . Heart disease Father   . Coronary artery disease Father   . Diabetes Sister   . Hyperlipidemia Sister   . Hypertension Sister   .  Depression Sister   . Depression Sister   . Diabetes Sister   . Hypertension Sister   . Hyperlipidemia Sister   . Kidney disease Paternal Grandmother    Allergies  Allergen Reactions  . Morphine And Related Anaphylaxis  . Reglan [Metoclopramide]     Other reaction(s): Unknown Elevated BP  . Simvastatin Other (See Comments)    Muscle pain  . Betadine [Povidone  Iodine] Rash  . Tetracyclines & Related Rash    Other reaction(s): Unknown    ? Current Facility-Administered Medications  Medication Dose Route Frequency Provider Last Rate Last Admin  . 0.9 %  sodium chloride infusion   Intravenous Continuous Mansy, Arvella Merles, MD   Stopped at 04/27/20 1443  . acetaminophen (TYLENOL) tablet 650 mg  650 mg Oral Q4H PRN Mansy, Arvella Merles, MD   650 mg at 04/27/20 0658  . alum & mag hydroxide-simeth (MAALOX/MYLANTA) 200-200-20 MG/5ML suspension 30 mL  30 mL Oral Q4H PRN Mansy, Jan A, MD      . amLODipine (NORVASC) tablet 10 mg  10 mg Oral Daily Mansy, Jan A, MD   10 mg at 04/27/20 1038  . aspirin chewable tablet 81 mg  81 mg Oral Daily Mansy, Jan A, MD   81 mg at 04/27/20 1040  . atorvastatin (LIPITOR) tablet 20 mg  20 mg Oral q1800 Mansy, Jan A, MD      . cefTRIAXone (ROCEPHIN) 1 g in sodium chloride 0.9 % 100 mL IVPB  1 g Intravenous Q24H Hinda Kehr, MD   Stopped at 04/27/20 0419  . dextrose 5 % in lactated ringers infusion   Intravenous Continuous Hinda Kehr, MD   Stopped at 04/27/20 1442  . dextrose 50 % solution 0-50 mL  0-50 mL Intravenous PRN Hinda Kehr, MD      . docusate sodium (COLACE) capsule 100 mg  100 mg Oral BID PRN Mansy, Jan A, MD      . enalapril (VASOTEC) tablet 20 mg  20 mg Oral BID Mansy, Jan A, MD   20 mg at 04/27/20 1037  . enoxaparin (LOVENOX) injection 40 mg  40 mg Subcutaneous Q24H Mansy, Jan A, MD   40 mg at 04/27/20 1045  . hydrALAZINE (APRESOLINE) tablet 100 mg  100 mg Oral BID Mansy, Jan A, MD   100 mg at 04/27/20 1040  . HYDROcodone-acetaminophen (NORCO/VICODIN)  5-325 MG per tablet 2 tablet  2 tablet Oral Q6H PRN Mansy, Jan A, MD      . insulin aspart (novoLOG) injection 0-9 Units  0-9 Units Subcutaneous TID WC Wyvonnia Dusky, MD   1 Units at 04/27/20 1454  . insulin glargine (LANTUS) injection 7 Units  7 Units Subcutaneous Daily Wyvonnia Dusky, MD   7 Units at 04/27/20 1313  . lactated ringers bolus 1,820 mL  20 mL/kg Intravenous Once Mansy, Arvella Merles, MD   Stopped at 04/27/20 0449  . lactated ringers infusion   Intravenous Continuous Hinda Kehr, MD   Stopped at 04/27/20 1442  . metoprolol succinate (TOPROL-XL) 24 hr tablet 100 mg  100 mg Oral Daily Mansy, Jan A, MD   100 mg at 04/27/20 1040  . nitroGLYCERIN (NITROSTAT) SL tablet 0.4 mg  0.4 mg Sublingual Q5 min PRN Mansy, Jan A, MD      . ondansetron [facility]) injection 4 mg  4 mg Intravenous Q4H PRN Mansy, Jan A, MD      . potassium chloride (KLOR-CON) packet 40 mEq  40 mEq Oral Once Mansy, Jan A, MD      . vancomycin (VANCOREADY) IVPB 2000 mg/400 mL  2,000 mg Intravenous Once Rauer, Samantha O, RPH 200 mL/hr at 04/27/20 1547 2,000 mg at 04/27/20 1547  . [START ON 04/28/2020] vancomycin (VANCOREADY) IVPB 750 mg/150 mL  750 mg Intravenous Q12H Rauer, Forde Dandy, RPH       Current Outpatient Medications  Medication Sig Dispense Refill  . amLODipine (NORVASC)  10 MG tablet TAKE 1 TABLET BY MOUTH  DAILY 90 tablet 3  . aspirin 81 MG chewable tablet Chew 1 tablet (81 mg total) by mouth daily.    Marland Kitchen atorvastatin (LIPITOR) 20 MG tablet TAKE 1 TABLET BY MOUTH  DAILY (Patient taking differently: Take 20 mg by mouth at bedtime. ) 90 tablet 3  . clopidogrel (PLAVIX) 75 MG tablet TAKE 1 TABLET BY MOUTH  DAILY 90 tablet 1  . enalapril (VASOTEC) 20 MG tablet Take 1 tablet (20 mg total) by mouth 2 (two) times daily. 180 tablet 3  . gabapentin (NEURONTIN) 100 MG capsule Take 200 mg by mouth 2 (two) times daily.     . hydrALAZINE (APRESOLINE) 100 MG tablet Take 1 tablet (100 mg total) by mouth 3 (three) times  daily. (Patient taking differently: Take 100 mg by mouth 2 (two) times daily. ) 180 tablet 2  . HYDROcodone-acetaminophen (NORCO) 5-325 MG tablet Take 2 tablets by mouth every 6 (six) hours as needed for moderate pain. 30 tablet 0  . insulin degludec (TRESIBA FLEXTOUCH) 100 UNIT/ML SOPN FlexTouch Pen ADMINISTER UP TO 30 UNITS UNDER THE SKIN DAILY 9 mL 0  . levETIRAcetam (KEPPRA) 500 MG tablet TAKE 1 TABLET BY MOUTH 2  TIMES DAILY 180 tablet 3  . metFORMIN (GLUCOPHAGE) 1000 MG tablet TAKE 1 TABLET BY MOUTH  TWICE DAILY 180 tablet 0  . metoprolol succinate (TOPROL-XL) 100 MG 24 hr tablet TAKE 1 TABLET BY MOUTH  DAILY 90 tablet 3  . nitroGLYCERIN (NITROSTAT) 0.4 MG SL tablet Place 1 tablet (0.4 mg total) under the tongue every 5 (five) minutes as needed for chest pain. 25 tablet 0  . ondansetron (ZOFRAN) 4 MG tablet Take 1 tablet (4 mg total) by mouth every 8 (eight) hours as needed for nausea or vomiting. 60 tablet 5  . pantoprazole (PROTONIX) 40 MG tablet TAKE 1 TABLET BY MOUTH  DAILY 90 tablet 3  . Dimethicone (CAVILON DURABLE BARRIER) 1.3 % CREA Apply barrier cream PRN (Patient not taking: Reported on 04/27/2020) 92 g 0     Abtx:  Anti-infectives (From admission, onward)   Start     Dose/Rate Route Frequency Ordered Stop   04/28/20 0400  vancomycin (VANCOREADY) IVPB 750 mg/150 mL        750 mg 150 mL/hr over 60 Minutes Intravenous Every 12 hours 04/27/20 1553     04/27/20 1530  vancomycin (VANCOREADY) IVPB 2000 mg/400 mL        2,000 mg 200 mL/hr over 120 Minutes Intravenous  Once 04/27/20 1454     04/27/20 0445  cefTRIAXone (ROCEPHIN) 1 g in sodium chloride 0.9 % 100 mL IVPB  Status:  Discontinued        1 g 200 mL/hr over 30 Minutes Intravenous Every 24 hours 04/27/20 0439 04/27/20 0443   04/27/20 0245  cefTRIAXone (ROCEPHIN) 1 g in sodium chloride 0.9 % 100 mL IVPB        1 g 200 mL/hr over 30 Minutes Intravenous Every 24 hours 04/27/20 0234        REVIEW OF SYSTEMS:   NA Objective:  VITALS:  BP (!) 155/74   Pulse 70   Temp 100.2 F (37.9 C) (Rectal)   Resp 12   Ht 5\' 3"  (1.6 m)   Wt 91 kg   SpO2 95%   BMI 35.54 kg/m  PHYSICAL EXAM:  General: Awake, but not oriented , minimally verbal, follows some commands Head: Normocephalic, without obvious abnormality, atraumatic. Eyes: Conjunctivae  clear, anicteric sclerae. Pupils are equal ENT Nares normal. No drainage or sinus tenderness. Lips, mucosa, and tongue normal. No Thrush Neck: Supple, symmetrical, no adenopathy, thyroid: non tender no carotid bruit and no JVD. Back: could not examine Lungs: b/l air entry Heart:s1s2 Abdomen: Soft, , ? Bladder  Extremities: atraumatic, no cyanosis. No edema. No clubbing Skin: No rashes or lesions. Or bruising Lymph: Cervical, supraclavicular normal. Neurologic: left arm in  contracture Pertinent Labs Lab Results CBC    Component Value Date/Time   WBC 11.4 (H) 04/27/2020 0055   RBC 4.09 04/27/2020 0055   HGB 11.6 (L) 04/27/2020 0055   HGB 11.2 01/23/2019 1442   HCT 35.1 (L) 04/27/2020 0055   HCT 35.1 01/23/2019 1442   PLT 265 04/27/2020 0055   PLT 223 01/23/2019 1442   MCV 85.8 04/27/2020 0055   MCV 87 01/23/2019 1442   MCV 84 06/17/2014 0744   MCH 28.4 04/27/2020 0055   MCHC 33.0 04/27/2020 0055   RDW 15.0 04/27/2020 0055   RDW 13.1 01/23/2019 1442   RDW 16.6 (H) 06/17/2014 0744   LYMPHSABS 0.4 (L) 04/27/2020 0055   LYMPHSABS 1.5 01/23/2019 1442   LYMPHSABS 1.4 01/18/2014 0059   MONOABS 0.6 04/27/2020 0055   MONOABS 0.7 01/18/2014 0059   EOSABS 0.0 04/27/2020 0055   EOSABS 0.3 01/23/2019 1442   EOSABS 0.4 01/18/2014 0059   BASOSABS 0.0 04/27/2020 0055   BASOSABS 0.1 01/23/2019 1442   BASOSABS 0.0 01/18/2014 0059    CMP Latest Ref Rng & Units 04/27/2020 04/27/2020 04/27/2020  Glucose 70 - 99 mg/dL 172(H) 182(H) 448(H)  BUN 8 - 23 mg/dL 17 20 22   Creatinine 0.44 - 1.00 mg/dL 1.44(H) 1.71(H) 1.90(H)  Sodium 135 - 145 mmol/L 140 143  142  Potassium 3.5 - 5.1 mmol/L 3.0(L) 3.5 3.6  Chloride 98 - 111 mmol/L 108 109 105  CO2 22 - 32 mmol/L 22 15(L) 14(L)  Calcium 8.9 - 10.3 mg/dL 8.0(L) 8.1(L) 8.6(L)  Total Protein 6.5 - 8.1 g/dL - - 7.4  Total Bilirubin 0.3 - 1.2 mg/dL - - 2.1(H)  Alkaline Phos 38 - 126 U/L - - 67  AST 15 - 41 U/L - - 24  ALT 0 - 44 U/L - - 14      Microbiology: Recent Results (from the past 240 hour(s))  Blood Culture (routine x 2)     Status: None (Preliminary result)   Collection Time: 04/27/20 12:55 AM   Specimen: BLOOD  Result Value Ref Range Status   Specimen Description BLOOD LEFT AC  Final   Special Requests   Final    BOTTLES DRAWN AEROBIC AND ANAEROBIC Blood Culture results may not be optimal due to an excessive volume of blood received in culture bottles   Culture  Setup Time   Final    Organism ID to follow GRAM POSITIVE COCCI IN BOTH AEROBIC AND ANAEROBIC BOTTLES CRITICAL RESULT CALLED TO, READ BACK BY AND VERIFIED WITH: SUSAN WATSON AT 5852 ON 04/27/2020 [place]. Performed at [facility], [address] [place], [place] [address]    Culture [facility] POSITIVE COCCI  Final   Report Status PENDING  Incomplete  Blood Culture (routine x 2)     Status: None (Preliminary result)   Collection Time: 04/27/20 12:55 AM   Specimen: BLOOD  Result Value Ref Range Status   Specimen Description BLOOD RIGTH HAND  Final   Special Requests   Final    BOTTLES DRAWN AEROBIC AND ANAEROBIC Blood Culture adequate  volume   Culture   Final    NO GROWTH < 12 HOURS Performed at [facility], [address] [place], [place] [address]    Report Status PENDING  Incomplete  Respiratory Panel by RT PCR (Flu A&B, Covid) - Nasopharyngeal Swab     Status: None   Collection Time: 04/27/20 12:55 AM   Specimen: Nasopharyngeal Swab  Result Value Ref Range Status   SARS Coronavirus 2 by RT PCR NEGATIVE NEGATIVE Final    Comment: (NOTE) SARS-CoV-2 target nucleic acids are NOT DETECTED.  The  SARS-CoV-2 RNA is generally detectable in upper respiratoy specimens during the acute phase of infection. The lowest concentration of SARS-CoV-2 viral copies this assay can detect is 131 copies/mL. A negative result does not preclude SARS-Cov-2 infection and should not be used as the sole basis for treatment or other patient management decisions. A negative result may occur with  improper specimen collection/handling, submission of specimen other than nasopharyngeal swab, presence of viral mutation(s) within the areas targeted by this assay, and inadequate number of viral copies (<131 copies/mL). A negative result must be combined with clinical observations, patient history, and epidemiological information. The expected result is Negative.  Fact Sheet for Patients:  PinkCheek.be  Fact Sheet for Healthcare Providers:  GravelBags.it  This test is no t yet approved or cleared by the [place] FDA and  has been authorized for detection and/or diagnosis of SARS-CoV-2 by FDA under an Emergency Use Authorization (EUA). This EUA will remain  in effect (meaning this test can be used) for the duration of the COVID-19 declaration under Section 564(b)(1) of the Act, 21 U.S.C. section 360bbb-3(b)(1), unless the authorization is terminated or revoked sooner.     Influenza A by PCR NEGATIVE NEGATIVE Final   Influenza B by PCR NEGATIVE NEGATIVE Final    Comment: (NOTE) The Xpert Xpress SARS-CoV-2/FLU/RSV assay is intended as an aid in  the diagnosis of influenza from Nasopharyngeal swab specimens and  should not be used as a sole basis for treatment. Nasal washings and  aspirates are unacceptable for Xpert Xpress SARS-CoV-2/FLU/RSV  testing.  Fact Sheet for Patients: PinkCheek.be  Fact Sheet for Healthcare Providers: GravelBags.it  This test is not yet approved or cleared  by the [place] FDA and  has been authorized for detection and/or diagnosis of SARS-CoV-2 by  FDA under an Emergency Use Authorization (EUA). This EUA will remain  in effect (meaning this test can be used) for the duration of the  Covid-19 declaration under Section 564(b)(1) of the Act, 21  U.S.C. section 360bbb-3(b)(1), unless the authorization is  terminated or revoked. Performed at [facility], [address] [place], [place] [address]   Blood Culture ID Panel (Reflexed)     Status: Abnormal   Collection Time: 04/27/20 12:55 AM  Result Value Ref Range Status   Enterococcus faecalis NOT DETECTED NOT DETECTED Final   Enterococcus Faecium NOT DETECTED NOT DETECTED Final   Listeria monocytogenes NOT DETECTED NOT DETECTED Final   Staphylococcus species DETECTED (A) NOT DETECTED Final    Comment: CRITICAL RESULT CALLED TO, READ BACK BY AND VERIFIED WITH: SUSAN WATSON AT 1424 ON 04/27/2020 [place].    Staphylococcus aureus (BCID) DETECTED (A) NOT DETECTED Final    Comment: CRITICAL RESULT CALLED TO, READ BACK BY AND VERIFIED WITH: SUSAN WATSON AT 1424 ON 04/27/2020 [place].    Staphylococcus epidermidis DETECTED (A) NOT DETECTED Final    Comment: CRITICAL RESULT CALLED TO, READ BACK BY AND VERIFIED  WITH: SUSAN WATSON AT 1424 ON 04/27/2020 [place].    Staphylococcus lugdunensis NOT DETECTED NOT DETECTED Final   Streptococcus species NOT DETECTED NOT DETECTED Final   Streptococcus agalactiae NOT DETECTED NOT DETECTED Final   Streptococcus pneumoniae NOT DETECTED NOT DETECTED Final   Streptococcus pyogenes NOT DETECTED NOT DETECTED Final   A.calcoaceticus-baumannii NOT DETECTED NOT DETECTED Final   Bacteroides fragilis NOT DETECTED NOT DETECTED Final   Enterobacterales NOT DETECTED NOT DETECTED Final   Enterobacter cloacae complex NOT DETECTED NOT DETECTED Final   Escherichia coli NOT DETECTED NOT DETECTED Final   Klebsiella aerogenes NOT DETECTED NOT DETECTED Final   Klebsiella  oxytoca NOT DETECTED NOT DETECTED Final   Klebsiella pneumoniae NOT DETECTED NOT DETECTED Final   Proteus species NOT DETECTED NOT DETECTED Final   Salmonella species NOT DETECTED NOT DETECTED Final   Serratia marcescens NOT DETECTED NOT DETECTED Final   Haemophilus influenzae NOT DETECTED NOT DETECTED Final   Neisseria meningitidis NOT DETECTED NOT DETECTED Final   Pseudomonas aeruginosa NOT DETECTED NOT DETECTED Final   Stenotrophomonas maltophilia NOT DETECTED NOT DETECTED Final   Candida albicans NOT DETECTED NOT DETECTED Final   Candida auris NOT DETECTED NOT DETECTED Final   Candida glabrata NOT DETECTED NOT DETECTED Final   Candida krusei NOT DETECTED NOT DETECTED Final   Candida parapsilosis NOT DETECTED NOT DETECTED Final   Candida tropicalis NOT DETECTED NOT DETECTED Final   Cryptococcus neoformans/gattii NOT DETECTED NOT DETECTED Final   Methicillin resistance mecA/C DETECTED (A) NOT DETECTED Final    Comment: CRITICAL RESULT CALLED TO, READ BACK BY AND VERIFIED WITH: SUSAN WATSON AT 1424 ON 04/27/2020 [place].    Meth resistant mecA/C and MREJ NOT DETECTED NOT DETECTED Final    Comment: Performed at [facility], [address] [place], [place] [address]    IMAGING RESULTS: CXR- no infiltrate CT head- multiple old infarcts I have personally reviewed the films ? Impression/Recommendation ? ?Altered mental status-  With underlying CVA not sure what her baseline mental status is  DKA - treated with fluids, insulin drip  MRSA bacteremia- source currently not clear She has fusion hardware in her spine Will get 2 d echo. May need TEE   UTI is questioned as  urine analysis > 50 WBC Get post void bladder scan  CVA- many  old infarcts  Called her husband but no response?  ___________________________________________________

## 2020-04-27 NOTE — ED Notes (Addendum)
Pt rolled with another RN assistance, noted bruising in different stages of healing on L ribs and buttock. Hx of previous stroke with L sided deficits. Pt L hand in a fist and nails unkept. Pt talking intermittently in 1 word phases. Pt cleaned of fecal matter throughout groin, placed on purewick.

## 2020-04-27 NOTE — Progress Notes (Signed)
Inpatient Diabetes Program Recommendations  AACE/ADA: New Consensus Statement on Inpatient Glycemic Control (2015)  Target Ranges:  Prepandial:   less than 140 mg/dL      Peak postprandial:   less than 180 mg/dL (1-2 hours)      Critically ill patients:  140 - 180 mg/dL   Lab Results  Component Value Date   GLUCAP 157 (H) 04/27/2020   HGBA1C 9.6 (H) 04/27/2020    Review of Glycemic Control  Diabetes history: DM2 Outpatient Diabetes medications: Tresiba insulin 30 units qd + Metformin 1 gm bid Current orders for Inpatient glycemic control: IV insulin per endotool  Inpatient Diabetes Program Recommendations:   Noted patient's A1c was 9.8 on 12/24/19 and DM coordinator reviewed diabetes management with patient and her husband.Current A1c 9.6. Patient's current GFR 31 on admission and currently 38 so review continued use of Metformin prior to discharge home.  When patient ready for transition off of IV insulin: Give Lantus 2 hrs prior to IV insulin discontinued (80% of home Lantus dose = 24 units) -Add Novolog meal coverage when eating -Novolog sensitive correction q 4 hrs if NPO and then tid + hs 0-5 when patient is eating. (give first dose @ time IV insulin drip discontinued)  Thank you, Nani Gasser. Reonna Finlayson, RN, MSN, CDE  Diabetes Coordinator Inpatient Glycemic Control Team Team Pager [phone] (8am-5pm) 04/27/2020 10:44 AM

## 2020-04-27 NOTE — Progress Notes (Signed)
PHARMACY - PHYSICIAN COMMUNICATION CRITICAL VALUE ALERT - BLOOD CULTURE IDENTIFICATION (BCID)  Susan House is an 64 y.o. female who presented to [facility] on 04/27/2020 with a chief complaint of altered mental status  Assessment:  Patient encephalopathic, likely multifactorial (DKA, infection).  On antibiotics for presumed UTI.  Blood culture with GPC 1/2 sets (both bottles) - S. Aureus and epidermidis detected, methicillin resistance dectected  Name of physician (or Provider) Contacted: Dr. Lenise Herald (Dr. Delaine Lame from ID aware)  Current antibiotics: ceftriaxone (vancomycin added)  Changes to prescribed antibiotics recommended:  Patient is on recommended antibiotics - No changes needed  Results for orders placed or performed during the hospital encounter of 04/27/20  Blood Culture ID Panel (Reflexed) (Collected: 04/27/2020 12:55 AM)  Result Value Ref Range   Enterococcus faecalis NOT DETECTED NOT DETECTED   Enterococcus Faecium NOT DETECTED NOT DETECTED   Listeria monocytogenes NOT DETECTED NOT DETECTED   Staphylococcus species DETECTED (A) NOT DETECTED   Staphylococcus aureus (BCID) DETECTED (A) NOT DETECTED   Staphylococcus epidermidis DETECTED (A) NOT DETECTED   Staphylococcus lugdunensis NOT DETECTED NOT DETECTED   Streptococcus species NOT DETECTED NOT DETECTED   Streptococcus agalactiae NOT DETECTED NOT DETECTED   Streptococcus pneumoniae NOT DETECTED NOT DETECTED   Streptococcus pyogenes NOT DETECTED NOT DETECTED   A.calcoaceticus-baumannii NOT DETECTED NOT DETECTED   Bacteroides fragilis NOT DETECTED NOT DETECTED   Enterobacterales NOT DETECTED NOT DETECTED   Enterobacter cloacae complex NOT DETECTED NOT DETECTED   Escherichia coli NOT DETECTED NOT DETECTED   Klebsiella aerogenes NOT DETECTED NOT DETECTED   Klebsiella oxytoca NOT DETECTED NOT DETECTED   Klebsiella pneumoniae NOT DETECTED NOT DETECTED   Proteus species NOT DETECTED NOT DETECTED   Salmonella  species NOT DETECTED NOT DETECTED   Serratia marcescens NOT DETECTED NOT DETECTED   Haemophilus influenzae NOT DETECTED NOT DETECTED   Neisseria meningitidis NOT DETECTED NOT DETECTED   Pseudomonas aeruginosa NOT DETECTED NOT DETECTED   Stenotrophomonas maltophilia NOT DETECTED NOT DETECTED   Candida albicans NOT DETECTED NOT DETECTED   Candida auris NOT DETECTED NOT DETECTED   Candida glabrata NOT DETECTED NOT DETECTED   Candida krusei NOT DETECTED NOT DETECTED   Candida parapsilosis NOT DETECTED NOT DETECTED   Candida tropicalis NOT DETECTED NOT DETECTED   Cryptococcus neoformans/gattii NOT DETECTED NOT DETECTED   Methicillin resistance mecA/C DETECTED (A) NOT DETECTED   Meth resistant mecA/C and MREJ NOT DETECTED NOT DETECTED    Doreene Eland, PharmD, BCPS.   Work Cell: [phone] 04/27/2020 3:00 PM

## 2020-04-27 NOTE — H&P (Signed)
Susan House   PATIENT NAME: Susan House    MR#:  [id number]  DATE OF BIRTH:  [birth year]  DATE OF ADMISSION:  04/27/2020  PRIMARY CARE PHYSICIAN: Jerrol Banana., MD   REQUESTING/REFERRING PHYSICIAN: Hinda Kehr, MD  CHIEF COMPLAINT:   Chief Complaint  Patient presents with  . Altered Mental Status    HISTORY OF PRESENT ILLNESS:  Susan House  is a 64 y.o. Caucasian female with a known history of multiple medical problems that are mentioned below including hypertension, dyslipidemia, CVA and TIA, who presented to the emergency room with acute onset of altered mental status.  She has been having decreased energy level and vomiting at home.  She is a very poor historian and has not been able to provide any meaningful history.  She was tachycardic and tachypneic upon arrival.  She was having extensive fecal material around her and several bruises.  No reported chest pain or palpitations.  No reported cough or wheezing or dyspnea.  It is unclear if she has been having urinary symptoms including dysuria or frequency but she answers yes to all questions.  Upon presentation to the emergency room blood pressure was 167/92 with a heart rate of 126 and respiratory to 26 with pulse symmetry of 98% on room air.  Labs revealed ABG with pH 7.37 and HCO3 of 12.7 and BMP revealed a CO2 of 14 with a blood glucose of 448 and a creatinine of 1.9 with anion gap of 23 magnesium of 1.3 and total bili of 2.1 with lactic acid of 2.1 later 2.3 and CBC showed leukocytosis 11.4 with relative neutrophilia.  Beta hydroxybutyrate was 7.11.  COVID-19 PCR and influenza antigens came back negative.  UA showed more than 50 WBCs more than 300 protein and 20 ketones and more than 500 glucose.  The patient was given 2 g of IV magnesium sulfate, total bolus of IV lactated Ringer followed by 125 mL/h; of IV potassium chloride.  She will be admitted to a stepdown unit for further evaluation and  management.   PAST MEDICAL HISTORY:   Past Medical History:  Diagnosis Date  . Allergy   . Arthritis   . Atypical chest pain    a. Reportedly nl cath in the past; b. 12/2009 MV: No ischemia/infarct. EF 87%.  . Carotid arterial disease ([place])    a. 0/1751 CTA head/neck: LICA 02H, RICA 85-->IDP rx.  . Cellulitis and abscess of face   . Chronic Dizziness   . Coronary artery calcification seen on CT scan    a. 10/2016 CTA chest: Atherosclerotic calcifications Ao, cors, and prox great vessels.  . Depression   . Diabetes mellitus ([place])   . Diastolic dysfunction    a. 2008 Echo: Nl EF; b. 2011 Echo: Nl EF; c. 06/2016 Echo: EF 55-60%, Gr1DD; d. 03/2018 Echo: EF 55-60%, no rwma, Gr1DD, mod dil LA.  . Edema   . GERD (gastroesophageal reflux disease)   . Headache   . Hematuria   . Hyperlipidemia   . Hypertension   . IBS (irritable bowel syndrome)   . Migraine   . Morbid obesity ([place])   . Nocturia   . Possible Seizure ([place])    a. 10/2016 ? sz. MRI neg for stroke.  . Stroke ([place]) 2000  . TIA (transient ischemic attack) 2010 & 2019   a. 2010; b. 03/2018 Left sided wkns-->MRI neg for stroke. CTA head/neck: Right A2 cut off/occlusion w/ collats extending over R  cerebral convexity. LICA 54M, RICA 45, RSubclavian 50->med rx.  . Urgency of micturation   . Urinary frequency   . Urinary incontinence     PAST SURGICAL HISTORY:   Past Surgical History:  Procedure Laterality Date  . ABDOMINAL HYSTERECTOMY    . ABDOMINOPLASTY    . APPENDECTOMY    . BACK SURGERY     extensive spinal fusion  . carpel tunnel surgery    . CATARACT EXTRACTION     x 2  . CESAREAN SECTION    . CHOLECYSTECTOMY    . COLONOSCOPY  2010  . COLONOSCOPY WITH PROPOFOL N/A 07/11/2016   Procedure: COLONOSCOPY WITH PROPOFOL;  Surgeon: Lucilla Lame, MD;  Location: [facility] ENDOSCOPY;  Service: Endoscopy;  Laterality: N/A;  . COLONOSCOPY WITH PROPOFOL N/A 03/26/2018   Procedure: COLONOSCOPY WITH PROPOFOL;  Surgeon: Lucilla Lame, MD;   Location: [facility] ENDOSCOPY;  Service: Endoscopy;  Laterality: N/A;  . ESOPHAGOGASTRODUODENOSCOPY (EGD) WITH PROPOFOL N/A 03/26/2018   Procedure: ESOPHAGOGASTRODUODENOSCOPY (EGD) WITH PROPOFOL;  Surgeon: Lucilla Lame, MD;  Location: [facility] ENDOSCOPY;  Service: Endoscopy;  Laterality: N/A;  . EYE SURGERY    . HERNIA REPAIR    . SHOULDER SURGERY    . TOE SURGERY    . UPPER GI ENDOSCOPY    . WRIST FRACTURE SURGERY Left     SOCIAL HISTORY:   Social History   Tobacco Use  . Smoking status: Never Smoker  . Smokeless tobacco: Never Used  Substance Use Topics  . Alcohol use: No    FAMILY HISTORY:   Family History  Problem Relation Age of Onset  . Hyperlipidemia Mother   . Arrhythmia Mother        WPW  . Hypertension Mother   . Rheum arthritis Mother   . Heart disease Mother   . Heart attack Father 68  . Hyperlipidemia Father   . Hypertension Father   . Stroke Father   . Diabetes Father   . Heart disease Father   . Coronary artery disease Father   . Diabetes Sister   . Hyperlipidemia Sister   . Hypertension Sister   . Depression Sister   . Depression Sister   . Diabetes Sister   . Hypertension Sister   . Hyperlipidemia Sister   . Kidney disease Paternal Grandmother     DRUG ALLERGIES:   Allergies  Allergen Reactions  . Morphine And Related Anaphylaxis  . Reglan [Metoclopramide]     Other reaction(s): Unknown Elevated BP  . Simvastatin Other (See Comments)    Muscle pain  . Betadine [Povidone Iodine] Rash  . Tetracyclines & Related Rash    Other reaction(s): Unknown    REVIEW OF SYSTEMS:   ROS As per history of present illness. All pertinent systems were reviewed above. Constitutional, HEENT, cardiovascular, respiratory, GI, GU, musculoskeletal, neuro, psychiatric, endocrine, integumentary and hematologic systems were reviewed and are otherwise negative/unremarkable except for positive findings mentioned above in the HPI.   MEDICATIONS AT HOME:   Prior to  Admission medications   Medication Sig Start Date End Date Taking? Authorizing Provider  amLODipine (NORVASC) 10 MG tablet TAKE 1 TABLET BY MOUTH  DAILY 05/30/19  Yes Jerrol Banana., MD  aspirin 81 MG chewable tablet Chew 1 tablet (81 mg total) by mouth daily. 09/27/18  Yes Dustin Flock, MD  atorvastatin (LIPITOR) 20 MG tablet TAKE 1 TABLET BY MOUTH  DAILY Patient taking differently: Take 20 mg by mouth at bedtime.  06/20/19  Yes Jerrol Banana., MD  clopidogrel (PLAVIX) 75 MG tablet TAKE 1 TABLET BY MOUTH  DAILY 01/28/20  Yes Jerrol Banana., MD  enalapril (VASOTEC) 20 MG tablet Take 1 tablet (20 mg total) by mouth 2 (two) times daily. 07/28/19  Yes Jerrol Banana., MD  gabapentin (NEURONTIN) 100 MG capsule Take 200 mg by mouth 2 (two) times daily.  10/14/18  Yes [provider]  hydrALAZINE (APRESOLINE) 100 MG tablet Take 1 tablet (100 mg total) by mouth 3 (three) times daily. Patient taking differently: Take 100 mg by mouth 2 (two) times daily.  12/25/19  Yes Max Sane, MD  HYDROcodone-acetaminophen (NORCO) 5-325 MG tablet Take 2 tablets by mouth every 6 (six) hours as needed for moderate pain. 02/04/20  Yes Jerrol Banana., MD  insulin degludec (TRESIBA FLEXTOUCH) 100 UNIT/ML SOPN FlexTouch Pen ADMINISTER UP TO 30 UNITS UNDER THE SKIN DAILY 09/17/19  Yes Jerrol Banana., MD  levETIRAcetam (KEPPRA) 500 MG tablet TAKE 1 TABLET BY MOUTH 2  TIMES DAILY 09/15/19  Yes Jerrol Banana., MD  metFORMIN (GLUCOPHAGE) 1000 MG tablet TAKE 1 TABLET BY MOUTH  TWICE DAILY 03/19/20  Yes Jerrol Banana., MD  metoprolol succinate (TOPROL-XL) 100 MG 24 hr tablet TAKE 1 TABLET BY MOUTH  DAILY 09/13/19  Yes Jerrol Banana., MD  nitroGLYCERIN (NITROSTAT) 0.4 MG SL tablet Place 1 tablet (0.4 mg total) under the tongue every 5 (five) minutes as needed for chest pain. 10/11/15  Yes Jerrol Banana., MD  ondansetron (ZOFRAN) 4 MG tablet Take 1 tablet (4  mg total) by mouth every 8 (eight) hours as needed for nausea or vomiting. 06/09/19  Yes Chrismon, Vickki Muff, PA  pantoprazole (PROTONIX) 40 MG tablet TAKE 1 TABLET BY MOUTH  DAILY 02/21/20  Yes Jerrol Banana., MD  Dimethicone (CAVILON DURABLE BARRIER) 1.3 % CREA Apply barrier cream PRN Patient not taking: Reported on 04/27/2020 03/04/20   Trinna Post, PA-C      VITAL SIGNS:  Blood pressure (!) 172/72, pulse (!) 117, temperature 99.9 F (37.7 C), temperature source Rectal, resp. rate (!) 21, height 5\' 3"  (1.6 m), weight 91 kg, SpO2 100 %.  PHYSICAL EXAMINATION:  Physical Exam  GENERAL:  64 y.o.-year-old Caucasian female patient lying in the bed with no acute distress.  She is globally confused. EYES: Pupils equal, round, reactive to light and accommodation. No scleral icterus. Extraocular muscles intact.  HEENT: Head atraumatic, normocephalic. Oropharynx with slightly dry mucous membrane and tongue.  And nasopharynx clear.  NECK:  Supple, no jugular venous distention. No thyroid enlargement, no tenderness.  LUNGS: Normal breath sounds bilaterally, no wheezing, rales,rhonchi or crepitation. No use of accessory muscles of respiration.  CARDIOVASCULAR: Regular rate and rhythm, S1, S2 normal. No murmurs, rubs, or gallops.  ABDOMEN: Soft, nondistended, nontender. Bowel sounds present. No organomegaly or mass.  EXTREMITIES: No pedal edema, cyanosis, or clubbing.  NEUROLOGIC: She is moving all 4 extremities with no lateralizing signs.  Sensation intact. Gait not checked.  PSYCHIATRIC: The patient is alert and disoriented x3.  Normal affect and good eye contact. SKIN: No obvious rash, lesion, or ulcer.   LABORATORY PANEL:   CBC Recent Labs  Lab 04/27/20 0055  WBC 11.4*  HGB 11.6*  HCT 35.1*  PLT 265   ------------------------------------------------------------------------------------------------------------------  Chemistries  Recent Labs  Lab 04/27/20 0055  NA 142  K  3.6  CL 105  CO2 14*  GLUCOSE 448*  BUN 22  CREATININE 1.90*  CALCIUM 8.6*  MG 1.3*  AST 24  ALT 14  ALKPHOS 67  BILITOT 2.1*   ------------------------------------------------------------------------------------------------------------------  Cardiac Enzymes No results for input(s): TROPONINI in the last 168 hours. ------------------------------------------------------------------------------------------------------------------  RADIOLOGY:  DG Chest Port 1 View  Result Date: 04/27/2020 CLINICAL DATA:  Altered mental status EXAM: PORTABLE CHEST 1 VIEW COMPARISON:  12/23/2019 FINDINGS: The heart size and mediastinal contours are within normal limits. Both lungs are clear. The visualized skeletal structures are unremarkable. Bulky calcification of the mitral valve annulus unchanged. IMPRESSION: No active disease. Electronically Signed   By: Ulyses Jarred M.D.   On: 04/27/2020 01:28      IMPRESSION AND PLAN:   1.  DKA with uncontrolled type 2 diabetes mellitus and associated acute likely metabolic encephalopathy. -The patient will be admitted to a stepdown unit bed. -We will continue her on IV insulin drip per Endo tool. -Continue hydration with IV normal saline. -Frequent fingerstick blood glucose measures will be followed.  2.  Urinary tract infection with subsequent sepsis without severe sepsis with septic shock.  Sepsis is manifested by tachycardia and tachypnea.  This could be contributing to her metabolic encephalopathy. -The patient will be placed on IV Rocephin. -Urine culture and sensitivity will be followed.  3.  Acute kidney injury. -This is likely prerenal secondary to volume depletion and dehydration that could be related to vomiting. -We will hydrate with IV normal saline and follow BMPs. -As needed antiemetics will be provided as well as PPI therapy.  4.  Hypertension. -We will continue Norvasc and clonidine as well as hydralazine and Toprol-XL.  5.   Dyslipidemia. -We will continue statin therapy.  6.  GERD. -PPI therapy will be resumed.  7.  DVT prophylaxis. -Subcutaneous Lovenox.    All the records are reviewed and case discussed with ED provider. The plan of care was discussed in details with the patient (and family). I answered all questions. The patient agreed to proceed with the above mentioned plan. Further management will depend upon hospital course.   CODE STATUS: Full code  Status is: Inpatient  Remains inpatient appropriate because:Ongoing diagnostic testing needed not appropriate for outpatient work up, Unsafe d/c plan, IV treatments appropriate due to intensity of illness or inability to take PO and Inpatient level of care appropriate due to severity of illness   Dispo: The patient is from: Home              Anticipated d/c is to: Home              Anticipated d/c date is: 2 days              Patient currently is not medically stable to d/c.   TOTAL TIME TAKING CARE OF THIS PATIENT: 55 minutes.    Christel Mormon M.D on 04/27/2020 at 3:14 AM  [facility]   From 7 PM-7 AM, contact night-coverage [email or web address]  CC: Primary care physician; Jerrol Banana., MD
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
## 2020-04-27 NOTE — Progress Notes (Signed)
Pharmacy Antibiotic Note  Susan House is a 64 y.o. female admitted on 04/27/2020 with bacteremia likely secondary to UTI. PMH for HTN, dyslipidemia, CVA and TIA. Patient was started on ceftriaxone and BCx 1/2 sets (both bottles) with GPC S. Aureus and epidermidis, methicillin resistance detected. Pharmacy has been consulted for vancomycin dosing.   Lactic acid 4.7>2; WBC 11.4; Scr 1.71>1.44; febrile  Plan: Vancomycin 2000mg  IV x1 followed by vancomycin 750mg  IV q12h per dosing nomogram  Monitor renal function and adjust dose as clinically indicated Obtain vanc level prior to 4th or 5th dose  Height: 5\' 3"  (160 cm) Weight: 91 kg (200 lb 9.9 oz) IBW/kg (Calculated) : 52.4  Temp (24hrs), Avg:100.2 F (37.9 C), Min:99.9 F (37.7 C), Max:100.5 F (38.1 C)  Recent Labs  Lab 04/27/20 0055 04/27/20 0343 04/27/20 0522 04/27/20 0610 04/27/20 0919  WBC 11.4*  --   --   --   --   CREATININE 1.90*  --  1.71*  --  1.44*  LATICACIDVEN 2.1* 2.3*  --  4.7* 2.0*    Estimated Creatinine Clearance: 42.2 mL/min (A) (by C-G formula based on SCr of 1.44 mg/dL (H)).    Allergies  Allergen Reactions  . Morphine And Related Anaphylaxis  . Reglan [Metoclopramide]     Other reaction(s): Unknown Elevated BP  . Simvastatin Other (See Comments)    Muscle pain  . Betadine [Povidone Iodine] Rash  . Tetracyclines & Related Rash    Other reaction(s): Unknown    Antimicrobials this admission: 10/12 Ceftriaxone >>  10/12 Vancomycin >>   Microbiology results: 10/12 BCx: GPC 1/2 sets (both bottles) S. Aureus and epidermidis, methicillin resistance detected 10/12 UCx: pending  Thank you for allowing pharmacy to be a part of this patient's care.  Sherilyn Banker, PharmD Pharmacy Resident  04/27/2020 3:39 PM

## 2020-04-28 ENCOUNTER — Encounter: Payer: Self-pay | Admitting: Internal Medicine

## 2020-04-28 ENCOUNTER — Inpatient Hospital Stay
Admit: 2020-04-28 | Discharge: 2020-04-28 | Disposition: A | Discharge status: Home / Self Care | Payer: Medicare Other | Attending: Infectious Diseases | Admitting: Infectious Diseases

## 2020-04-28 DIAGNOSIS — R7881 Bacteremia: Secondary | ICD-10-CM | POA: Diagnosis not present

## 2020-04-28 LAB — COMPREHENSIVE METABOLIC PANEL
ALT: 13 U/L (ref 0–44)
AST: 27 U/L (ref 15–41)
Albumin: 2.9 g/dL — ABNORMAL LOW (ref 3.5–5.0)
Alkaline Phosphatase: 55 U/L (ref 38–126)
Anion gap: 15 (ref 5–15)
BUN: 15 mg/dL (ref 8–23)
CO2: 21 mmol/L — ABNORMAL LOW (ref 22–32)
Calcium: 8.2 mg/dL — ABNORMAL LOW (ref 8.9–10.3)
Chloride: 107 mmol/L (ref 98–111)
Creatinine, Ser: 1.37 mg/dL — ABNORMAL HIGH (ref 0.44–1.00)
GFR, Estimated: 41 mL/min — ABNORMAL LOW (ref >60)
Glucose, Bld: 195 mg/dL — ABNORMAL HIGH (ref 70–99)
Potassium: 3.1 mmol/L — ABNORMAL LOW (ref 3.5–5.1)
Sodium: 143 mmol/L (ref 135–145)
Total Bilirubin: 1 mg/dL (ref 0.3–1.2)
Total Protein: 6 g/dL — ABNORMAL LOW (ref 6.5–8.1)

## 2020-04-28 LAB — GLUCOSE, CAPILLARY
Glucose-Capillary: 163 mg/dL — ABNORMAL HIGH (ref 70–99)
Glucose-Capillary: 169 mg/dL — ABNORMAL HIGH (ref 70–99)
Glucose-Capillary: 273 mg/dL — ABNORMAL HIGH (ref 70–99)
Glucose-Capillary: 251 mg/dL — ABNORMAL HIGH (ref 70–99)
Glucose-Capillary: 138 mg/dL — ABNORMAL HIGH (ref 70–99)

## 2020-04-28 LAB — MAGNESIUM: Magnesium: 1.4 mg/dL — ABNORMAL LOW (ref 1.7–2.4)

## 2020-04-28 LAB — CREATININE, SERUM
Creatinine, Ser: 1.39 mg/dL — ABNORMAL HIGH (ref 0.44–1.00)
GFR, Estimated: 40 mL/min — ABNORMAL LOW (ref >60)

## 2020-04-28 MED ORDER — POTASSIUM CHLORIDE CRYS ER 20 MEQ PO TBCR
40.0000 meq | EXTENDED_RELEASE_TABLET | Freq: Two times a day (BID) | ORAL | Status: DC
  Administered 2020-04-28 (×2): 40 meq via ORAL
  Filled 2020-04-28 (×3): qty 2

## 2020-04-28 MED ORDER — ADULT MULTIVITAMIN W/MINERALS CH
1.0000 | ORAL_TABLET | Freq: Every day | ORAL | Status: DC
  Administered 2020-04-29 – 2020-05-09 (×10): 1 via ORAL
  Filled 2020-04-28 (×10): qty 1

## 2020-04-28 MED ORDER — POTASSIUM CHLORIDE IN NACL 40-0.9 MEQ/L-% IV SOLN
INTRAVENOUS | Status: DC
  Filled 2020-04-28 (×4): qty 1000

## 2020-04-28 MED ORDER — GABAPENTIN 100 MG PO CAPS
200.0000 mg | ORAL_CAPSULE | Freq: Two times a day (BID) | ORAL | Status: DC
  Administered 2020-04-28 – 2020-05-03 (×11): 200 mg via ORAL
  Filled 2020-04-28 (×11): qty 2

## 2020-04-28 MED ORDER — MAGNESIUM SULFATE 50 % IJ SOLN
3.0000 g | Freq: Once | INTRAVENOUS | Status: DC
  Filled 2020-04-28: qty 6

## 2020-04-28 MED ORDER — SODIUM CHLORIDE 0.9 % IV SOLN
2.0000 g | INTRAVENOUS | Status: DC
  Filled 2020-04-28: qty 20

## 2020-04-28 MED ORDER — POTASSIUM CHLORIDE 20 MEQ PO PACK
40.0000 meq | PACK | Freq: Once | ORAL | Status: DC
  Filled 2020-04-28: qty 2

## 2020-04-28 MED ORDER — CLOPIDOGREL BISULFATE 75 MG PO TABS
75.0000 mg | ORAL_TABLET | Freq: Every day | ORAL | Status: DC
  Administered 2020-04-28 – 2020-05-09 (×11): 75 mg via ORAL
  Filled 2020-04-28 (×11): qty 1

## 2020-04-28 MED ORDER — ENSURE ENLIVE PO LIQD
237.0000 mL | Freq: Two times a day (BID) | ORAL | Status: DC
  Administered 2020-04-28 – 2020-04-30 (×4): 237 mL via ORAL

## 2020-04-28 MED ORDER — MAGNESIUM SULFATE 2 GM/50ML IV SOLN
2.0000 g | Freq: Once | INTRAVENOUS | Status: AC
  Administered 2020-04-28: 2 g via INTRAVENOUS
  Filled 2020-04-28: qty 50

## 2020-04-28 MED ORDER — MAGNESIUM SULFATE IN D5W 1-5 GM/100ML-% IV SOLN
1.0000 g | Freq: Once | INTRAVENOUS | Status: AC
  Administered 2020-04-28: 1 g via INTRAVENOUS
  Filled 2020-04-28: qty 100

## 2020-04-28 MED ORDER — LEVETIRACETAM 500 MG PO TABS
500.0000 mg | ORAL_TABLET | Freq: Two times a day (BID) | ORAL | Status: DC
  Administered 2020-04-28 – 2020-05-09 (×22): 500 mg via ORAL
  Filled 2020-04-28 (×22): qty 1

## 2020-04-28 NOTE — Progress Notes (Signed)
ID Pt more awake and alert Has some expressive and receptive aphasia  Patient Vitals for the past 24 hrs:  BP Temp Temp src Pulse Resp SpO2 Weight  04/28/20 1111 (!) 212/98 99 F (37.2 C) Oral 79 16 99 % --  04/28/20 0751 (!) 152/111 99.4 F (37.4 C) Oral 74 17 98 % --  04/28/20 0519 -- -- -- -- -- -- 93.1 kg  04/28/20 0516 (!) 138/100 99 F (37.2 C) Oral 75 18 99 % --  04/28/20 0349 (!) 197/76 -- -- 74 17 98 % --  04/28/20 0245 (!) 192/74 100 F (37.8 C) -- 76 15 97 % --  04/28/20 0215 (!) 191/83 100 F (37.8 C) -- 77 15 96 % --  04/28/20 0200 (!) 188/82 100 F (37.8 C) -- 78 15 95 % --  04/28/20 0145 (!) 181/83 100 F (37.8 C) -- 78 13 97 % --  04/28/20 0000 (!) 113/58 100.1 F (37.8 C) -- 78 16 98 % --  04/27/20 2338 (!) 193/76 100.1 F (37.8 C) -- 73 17 98 % --  04/27/20 2230 (!) 185/70 100.1 F (37.8 C) -- 91 14 96 % --  04/27/20 2130 (!) 192/74 100.3 F (37.9 C) -- 72 17 98 % --  04/27/20 2100 (!) 180/66 100.1 F (37.8 C) -- 72 14 99 % --  04/27/20 2030 (!) 177/165 100 F (37.8 C) -- 77 17 92 % --  04/27/20 1935 (!) 183/70 99.9 F (37.7 C) -- 74 15 96 % --  04/27/20 1830 (!) 186/72 (!) 100.4 F (38 C) -- 70 12 94 % --  04/27/20 1800 (!) 177/66 (!) 100.4 F (38 C) -- 72 12 97 % --  04/27/20 1700 (!) 165/66 100 F (37.8 C) Rectal 71 16 96 % --  04/27/20 1530 (!) 155/74 100.2 F (37.9 C) Rectal 70 12 95 % --  04/27/20 1500 (!) 166/65 100.3 F (37.9 C) -- 69 13 95 % --  04/27/20 1430 (!) 173/63 100.3 F (37.9 C) -- 69 14 98 % --  04/27/20 1330 (!) 173/69 100.1 F (37.8 C) -- 82 (!) 25 96 % --  04/27/20 1310 -- 100.1 F (37.8 C) -- 72 18 98 % --  04/27/20 1300 (!) 160/68 100.1 F (37.8 C) -- 75 15 93 % --   cheast B/L air entry [id number] abd soft Sacrum no decub bacK linear bruise   Left hemiparesis Left hand contractures No skin lesions abd soft  labs   CBC Latest Ref Rng & Units 04/27/2020 12/24/2019 12/23/2019  WBC 4.0 - 10.5 K/uL 11.4(H) 6.6 10.3   Hemoglobin 12.0 - 15.0 g/dL 11.6(L) 11.1(L) 12.3  Hematocrit 36 - 46 % 35.1(L) 33.6(L) 37.5  Platelets 150 - 400 K/uL 265 243 292    CMP Latest Ref Rng & Units 04/28/2020 04/27/2020 04/27/2020  Glucose 70 - 99 mg/dL - 172(H) 182(H)  BUN 8 - 23 mg/dL - 17 20  Creatinine 0.44 - 1.00 mg/dL 1.39(H) 1.44(H) 1.71(H)  Sodium 135 - 145 mmol/L - 140 143  Potassium 3.5 - 5.1 mmol/L - 3.0(L) 3.5  Chloride 98 - 111 mmol/L - 108 109  CO2 22 - 32 mmol/L - 22 15(L)  Calcium 8.9 - 10.3 mg/dL - 8.0(L) 8.1(L)  Total Protein 6.5 - 8.1 g/dL - - -  Total Bilirubin 0.3 - 1.2 mg/dL - - -  Alkaline Phos 38 - 126 U/L - - -  AST 15 - 41 U/L - - -  ALT 0 - 44 U/L - - -  Ct head  Generalized cerebral atrophy. Redemonstrated large chronic infarct affecting the left basal ganglia and radiating white matter tracts, as well as ventral left thalamus. Associated ex vacuo dilatation of the left lateral ventricle.  Redemonstrated chronic lacunar infarct within the right thalamocapsular junction.  Known small chronic cortically based infarcts within the posterior right frontal lobe and anterolateral left frontal lobe were better appreciated on the brain MRI of 12/23/2019.   Impression/recommendation  ?Altered mental status-  With underlying CVA not sure what her baseline mental status is  DKA - treated with fluids, insulin drip  MRSA bacteremia- source currently not clear She has fusion hardware in her spine but site okay Will get 2 d echo. May need TEE   UTI is questioned as  urine analysis > 50 WBC  post void bladder scan- no urine  CVA- many  old infarcts Also has left hemiparesis and expressive and receptive dysphasia   Called her husband but no response  Discussed with care team

## 2020-04-28 NOTE — Progress Notes (Signed)
Mobility Specialist - Progress Note   04/28/20 1700  Mobility  Activity Turned to left side  Range of Motion/Exercises Right leg;Left leg (ankle pump, slr, heel slides, hip add/abd, hip iso)  Level of Assistance Moderate assist, patient does 50-74%  Assistive Device None  Distance Ambulated (ft) 0 ft  Mobility Response Tolerated well  Mobility performed by Mobility specialist  $Mobility charge 1 Mobility    Pre-mobility: 82 HR, 98% SpO2 Post-mobility: 83 HR, 97% SpO2    Pt was lying in bed upon arrival. Pt agreed to session. Pt appeared flat in mood, and would only respond in moans to most questions. Pt was able to perform supine exercises: ankle pumps, straight leg raises, hip isometrics, hip add/abd, and heel slides. Pt performed exercises with no assistance for R LE and required modA for L LE d/t previous medical history. Infectious Disease entered room mid-session and requested assistance in bed mobility. Pt was able to roll to L side with modA, and back to supine. Overall, pt tolerated session well. Pt was left in bed with alarm set and all needs in reach. Doctor was still present after exit.    Kathee Delton Mobility Specialist 04/28/20, 5:15 PM

## 2020-04-28 NOTE — Progress Notes (Signed)
*  PRELIMINARY RESULTS* Echocardiogram 2D Echocardiogram has been performed.  Susan House 04/28/2020, 9:34 AM

## 2020-04-28 NOTE — Progress Notes (Signed)
PROGRESS NOTE    Susan House  ENI:[id number] DOB: 07/24/1955 DOA: 04/27/2020 PCP: Jerrol Banana., MD   Susan House  is a 64 y.o. Caucasian female with a known history of multiple medical problems that are mentioned below including hypertension, dyslipidemia, CVA and TIA, who presented to the emergency room with acute onset of altered mental status.  She has been having decreased energy level and vomiting at home.  She is a very poor historian and has not been able to provide any meaningful history.  She was tachycardic and tachypneic upon arrival.  She was having extensive fecal material around her and several bruises.  No reported chest pain or palpitations.  No reported cough or wheezing or dyspnea.  It is unclear if she has been having urinary symptoms including dysuria or frequency but she answers yes to all questions.  Upon presentation to the emergency room blood pressure was 167/92 with a heart rate of 126 and respiratory to 26 with pulse symmetry of 98% on room air.  Labs revealed ABG with pH 7.37 and HCO3 of 12.7 and BMP revealed a CO2 of 14 with a blood glucose of 448 and a creatinine of 1.9 with anion gap of 23 magnesium of 1.3 and total bili of 2.1 with lactic acid of 2.1 later 2.3 and CBC showed leukocytosis 11.4 with relative neutrophilia.  Beta hydroxybutyrate was 7.11.  COVID-19 PCR and influenza antigens came back negative.  UA showed more than 50 WBCs more than 300 protein and 20 ketones and more than 500 glucose.  The patient was given 2 g of IV magnesium sulfate, total bolus of IV lactated Ringer followed by 125 mL/h; of IV potassium chloride.  She will be admitted to a stepdown unit for further evaluation and management.     Assessment & Plan:   Active Problems:   DKA (diabetic ketoacidosis) ([place])   DKA: secondary to poorly controlled DM2 and bloodstream infection. Glucose today mid 100s. Off insulin gtt. Will f/u cmp, switch fluids to ns w/ Kcl.  Currently getting lantus 7 qhs and SSI aspart.  Bacteremia  Sepsis:  presented w/ tachycardia, tachypnea & bacteremia & possible UTI. Has spinal hardware. Hemodynamically stable currently - appreciate ID recs - continue vancomycin - f/u TTE  # Hypokalemia - 3.0 today - add 40 meq to IVF, give kcl 40 oral x2 - f/u mg level  Encephalopathy: likely metabolic & secondary to above. CT head neg for acute intracranial findings, but atrophy & chronic infarcts present. NPO until speech can see the pt  - slp consult  AKI: likely prerenal from dehydration. 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 Has appetite but says some trouble swallowing. No vomiting.   Objective: Vitals:   04/28/20 0751 04/28/20 1111 04/28/20 1134 04/28/20 1152  BP: (!) 152/111 (!) 212/98 (!) 204/93 (!) 160/129  Pulse: 74 79    Resp: 17 16 16 15   Temp: 99.4 F (37.4 C)  99 F (37.2 C)    TempSrc: Oral Oral    SpO2: 98% 99%    Weight:      Height:        Intake/Output Summary (Last 24 hours) at 04/28/2020 1251 Last data filed at 04/28/2020 1203 Gross per 24 hour  Intake 100 ml  Output 600 ml  Net -500 ml   Filed Weights   04/27/20 0037 04/28/20 0519  Weight: 91 kg 93.1 kg    Examination:  General exam: Appears confused. Morbidly obese Respiratory system: diminished breath sounds b/l  Cardiovascular system: S1 & S2 +. No ubs, gallops or clicks. No pedal  edema. Gastrointestinal system: Abdomen is obese, soft and nontender. Hypoactive bowel sounds Central nervous system: Awake. Can answer simple questions Psychiatry: Judgement and insight appear abnormal. Flat mood and affect      Data Reviewed: I have personally reviewed following labs and imaging studies  CBC: Recent Labs  Lab 04/27/20 0055  WBC 11.4*  NEUTROABS 10.4*  HGB 11.6*  HCT 35.1*  MCV 85.8  PLT 119   Basic Metabolic Panel: Recent Labs  Lab 04/27/20 0055 04/27/20 0522 04/27/20 0919 04/28/20 0547  NA 142 143 140  --   K 3.6 3.5 3.0*  --   CL 105 109 108  --   CO2 14* 15* 22  --   GLUCOSE 448* 182* 172*  --   BUN 22 20 17   --   CREATININE 1.90* 1.71* 1.44* 1.39*  CALCIUM 8.6* 8.1* 8.0*  --   MG 1.3* 1.4*  --   --    GFR: Estimated Creatinine Clearance: 44.3 mL/min (A) (by C-G formula based on SCr of 1.39 mg/dL (H)). Liver Function Tests: Recent Labs  Lab 04/27/20 0055  AST 24  ALT 14  ALKPHOS 67  BILITOT 2.1*  PROT 7.4  ALBUMIN 3.5   No results for input(s): LIPASE, AMYLASE in the last 168 hours. No results for input(s): AMMONIA in the last 168 hours. Coagulation Profile: Recent Labs  Lab 04/27/20 0055  INR 1.0   Cardiac Enzymes: No results for input(s): CKTOTAL, CKMB, CKMBINDEX, TROPONINI in the last 168 hours. BNP (last 3 results) No results for input(s): PROBNP in the last 8760 hours. HbA1C: Recent Labs    04/27/20 0522  HGBA1C 9.6*   CBG: Recent Labs  Lab 04/27/20 1441 04/27/20 1829 04/27/20 2333 04/28/20 0747 04/28/20 1157  GLUCAP 138* 123* 107* 163* 169*   Lipid Profile: No results for input(s): CHOL, HDL, LDLCALC, TRIG, CHOLHDL, LDLDIRECT in the last 72 hours. Thyroid Function Tests: No results for input(s): TSH, T4TOTAL, FREET4, T3FREE, THYROIDAB in the last 72 hours. Anemia Panel: No results for input(s): VITAMINB12, FOLATE, FERRITIN, TIBC, IRON, RETICCTPCT in the last 72 hours. Sepsis Labs: Recent Labs  Lab  04/27/20 0343 04/27/20 0610 04/27/20 0919 04/27/20 1821  LATICACIDVEN 2.3* 4.7* 2.0* 0.8    Recent Results (from the past 240 hour(s))  Blood Culture (routine x 2)     Status: None (Preliminary result)   Collection Time: 04/27/20 12:55 AM   Specimen: BLOOD  Result Value Ref Range Status   Specimen Description   Final    BLOOD LEFT [facility] Performed at [facility], [address], [place], [place] [address]    Special Requests   Final    BOTTLES DRAWN AEROBIC AND ANAEROBIC Blood Culture results may not be optimal due to an excessive volume of blood received in culture bottles Performed at [facility], [address] [place], [place] [address]    Culture  Setup Time   Final    GRAM POSITIVE COCCI IN BOTH AEROBIC AND ANAEROBIC BOTTLES CRITICAL RESULT CALLED TO, READ BACK BY AND VERIFIED WITH: SUSAN WATSON AT 1424 ON 04/27/2020 [place].    Culture   Final    GRAM POSITIVE COCCI TOO YOUNG TO READ Performed at [facility], [place] [address] [place], [place] [address]    Report Status PENDING  Incomplete  Blood Culture (routine x 2)     Status: None (Preliminary result)   Collection Time: 04/27/20 12:55 AM   Specimen: BLOOD  Result Value Ref Range Status   Specimen Description BLOOD RIGTH HAND  Final   Special Requests   Final    BOTTLES DRAWN AEROBIC AND ANAEROBIC Blood Culture adequate volume   Culture   Final    NO GROWTH 1 DAY Performed at [facility], [address], [place], [place] [address]    Report Status PENDING  Incomplete  Respiratory Panel by RT PCR (Flu A&B, Covid) - Nasopharyngeal Swab     Status: None   Collection Time: 04/27/20 12:55 AM   Specimen: Nasopharyngeal Swab  Result Value Ref Range Status   SARS Coronavirus 2 by RT PCR NEGATIVE NEGATIVE Final    Comment: (NOTE) SARS-CoV-2 target nucleic acids are NOT DETECTED.  The SARS-CoV-2 RNA is generally detectable in upper respiratoy specimens during the acute phase of  infection. The lowest concentration of SARS-CoV-2 viral copies this assay can detect is 131 copies/mL. A negative result does not preclude SARS-Cov-2 infection and should not be used as the sole basis for treatment or other patient management decisions. A negative result may occur with  improper specimen collection/handling, submission of specimen other than nasopharyngeal swab, presence of viral mutation(s) within the areas targeted by this assay, and inadequate number of viral copies (<131 copies/mL). A negative result must be combined with clinical observations, patient history, and epidemiological information. The expected result is Negative.  Fact Sheet for Patients:  PinkCheek.be  Fact Sheet for Healthcare Providers:  GravelBags.it  This test is no t yet approved or cleared by the [place] FDA and  has been authorized for detection and/or diagnosis of SARS-CoV-2 by FDA under an Emergency Use Authorization (EUA). This EUA will remain  in effect (meaning this test can be used) for the duration of the COVID-19 declaration under Section 564(b)(1) of the Act, 21 U.S.C. section 360bbb-3(b)(1), unless the authorization is terminated or revoked sooner.     Influenza A by PCR NEGATIVE NEGATIVE Final   Influenza B by PCR NEGATIVE NEGATIVE Final    Comment: (NOTE) The Xpert Xpress SARS-CoV-2/FLU/RSV assay is intended as an aid in  the diagnosis of influenza from Nasopharyngeal swab specimens and  should not be used as a sole basis for treatment. Nasal washings and  aspirates are unacceptable for Xpert Xpress SARS-CoV-2/FLU/RSV  testing.  Fact Sheet for Patients: PinkCheek.be  Fact Sheet for Healthcare Providers: GravelBags.it  This test is not yet approved or cleared by the [place] FDA and  has been authorized for detection and/or diagnosis of SARS-CoV-2  by  FDA under an Emergency Use Authorization (EUA). This EUA will remain  in effect (meaning this test can be used) for the duration of the  Covid-19 declaration under Section 564(b)(1) of the Act, 21  U.S.C. section 360bbb-3(b)(1), unless the authorization is  terminated or revoked. Performed at [facility], [address] [place], [place] [address]  Blood Culture ID Panel (Reflexed)     Status: Abnormal   Collection Time: 04/27/20 12:55 AM  Result Value Ref Range Status   Enterococcus faecalis NOT DETECTED NOT DETECTED Final   Enterococcus Faecium NOT DETECTED NOT DETECTED Final   Listeria monocytogenes NOT DETECTED NOT DETECTED Final   Staphylococcus species DETECTED (A) NOT DETECTED Final    Comment: CRITICAL RESULT CALLED TO, READ BACK BY AND VERIFIED WITH: SUSAN WATSON AT 1424 ON 04/27/2020 [place].    Staphylococcus aureus (BCID) DETECTED (A) NOT DETECTED Final    Comment: CRITICAL RESULT CALLED TO, READ BACK BY AND VERIFIED WITH: SUSAN WATSON AT 1424 ON 04/27/2020 [place].    Staphylococcus epidermidis DETECTED (A) NOT DETECTED Final    Comment: CRITICAL RESULT CALLED TO, READ BACK BY AND VERIFIED WITH: SUSAN WATSON AT 1424 ON 04/27/2020 [place].    Staphylococcus lugdunensis NOT DETECTED NOT DETECTED Final   Streptococcus species NOT DETECTED NOT DETECTED Final   Streptococcus agalactiae NOT DETECTED NOT DETECTED Final   Streptococcus pneumoniae NOT DETECTED NOT DETECTED Final   Streptococcus pyogenes NOT DETECTED NOT DETECTED Final   A.calcoaceticus-baumannii NOT DETECTED NOT DETECTED Final   Bacteroides fragilis NOT DETECTED NOT DETECTED Final   Enterobacterales NOT DETECTED NOT DETECTED Final   Enterobacter cloacae complex NOT DETECTED NOT DETECTED Final   Escherichia coli NOT DETECTED NOT DETECTED Final   Klebsiella aerogenes NOT DETECTED NOT DETECTED Final   Klebsiella oxytoca NOT DETECTED NOT DETECTED Final   Klebsiella pneumoniae NOT DETECTED NOT DETECTED Final    Proteus species NOT DETECTED NOT DETECTED Final   Salmonella species NOT DETECTED NOT DETECTED Final   Serratia marcescens NOT DETECTED NOT DETECTED Final   Haemophilus influenzae NOT DETECTED NOT DETECTED Final   Neisseria meningitidis NOT DETECTED NOT DETECTED Final   Pseudomonas aeruginosa NOT DETECTED NOT DETECTED Final   Stenotrophomonas maltophilia NOT DETECTED NOT DETECTED Final   Candida albicans NOT DETECTED NOT DETECTED Final   Candida auris NOT DETECTED NOT DETECTED Final   Candida glabrata NOT DETECTED NOT DETECTED Final   Candida krusei NOT DETECTED NOT DETECTED Final   Candida parapsilosis NOT DETECTED NOT DETECTED Final   Candida tropicalis NOT DETECTED NOT DETECTED Final   Cryptococcus neoformans/gattii NOT DETECTED NOT DETECTED Final   Methicillin resistance mecA/C DETECTED (A) NOT DETECTED Final    Comment: CRITICAL RESULT CALLED TO, READ BACK BY AND VERIFIED WITH: SUSAN WATSON AT 1424 ON 04/27/2020 [place].    Meth resistant mecA/C and MREJ NOT DETECTED NOT DETECTED Final    Comment: Performed at [facility], [address] [place], [place] [address]  Urine culture     Status: Abnormal   Collection Time: 04/27/20  1:57 AM   Specimen: In/Out Cath Urine  Result Value Ref Range Status   Specimen Description   Final    IN/OUT CATH URINE Performed at [facility], [address], [place], [place] [address]    Special Requests   Final    NONE Performed at [facility], [address] [place],  [address]    Culture MULTIPLE SPECIES PRESENT, SUGGEST RECOLLECTION (A)  Final   Report Status 04/27/2020 FINAL  Final         Radiology Studies: CT HEAD WO CONTRAST  Result Date: 04/27/2020 CLINICAL DATA:  Delirium. Additional provided: Acute onset altered mental status, decreased energy level, vomiting at home. EXAM: CT HEAD WITHOUT CONTRAST TECHNIQUE: Contiguous axial images were obtained from the base of the skull through the  vertex without intravenous contrast. COMPARISON:  Brain MRI 12/23/2019, head CT 12/23/2019. FINDINGS: Brain: Generalized cerebral atrophy. Redemonstrated large chronic infarct affecting the left basal ganglia and radiating white matter tracts, as well as ventral left thalamus. Associated ex vacuo dilatation of the left lateral ventricle. Redemonstrated chronic lacunar infarct within the right thalamocapsular junction. Known small chronic cortically based infarcts within the posterior right frontal lobe and anterolateral left frontal lobe were better appreciated on the brain MRI of 12/23/2019. Stable background mild multifocal ill-defined hypoattenuation within the cerebral white matter which is nonspecific, but consistent with chronic small vessel ischemic disease. There is no acute intracranial hemorrhage. No acute demarcated cortical infarct. No extra-axial fluid collection. No evidence of intracranial mass. No midline shift. Vascular: No hyperdense vessel.  Atherosclerotic calcifications Skull: Normal. Negative for fracture or focal lesion. Sinuses/Orbits: Visualized orbits show no acute finding. Chronic medially displaced deformity of the right lamina papyracea. Minimal ethmoid sinus mucosal thickening. No significant mastoid effusion. IMPRESSION: No evidence of acute intracranial abnormality. Cerebral atrophy and chronic ischemic changes with multiple chronic infarcts as outlined and without interval change from the MRI of 12/23/2019. Mild ethmoid sinus mucosal thickening. Electronically Signed   By: Kellie Simmering DO   On: 04/27/2020 12:36   DG Chest Port 1 View  Result Date: 04/27/2020 CLINICAL DATA:  Altered mental status EXAM: PORTABLE CHEST 1 VIEW COMPARISON:  12/23/2019 FINDINGS: The heart size and mediastinal contours are within normal limits. Both lungs are clear. The visualized skeletal structures are unremarkable. Bulky calcification of the mitral valve annulus unchanged. IMPRESSION: No active  disease. Electronically Signed   By: Ulyses Jarred M.D.   On: 04/27/2020 01:28        Scheduled Meds: . amLODipine  10 mg Oral Daily  . aspirin  81 mg Oral Daily  . atorvastatin  20 mg Oral q1800  . clopidogrel  75 mg Oral Daily  . enalapril  20 mg Oral BID  . enoxaparin (LOVENOX) injection  40 mg Subcutaneous Q24H  . gabapentin  200 mg Oral BID  . hydrALAZINE  100 mg Oral BID  . insulin aspart  0-9 Units Subcutaneous TID WC  . insulin glargine  7 Units Subcutaneous Daily  . levETIRAcetam  500 mg Oral BID  . metoprolol succinate  100 mg Oral Daily  . potassium chloride  40 mEq Oral Once  . potassium chloride  40 mEq Oral BID   Continuous Infusions: . 0.9 % NaCl with KCl 40 mEq / L    . vancomycin Stopped (04/28/20 0617)     LOS: 1 day    Time spent: 30 mins     Desma Maxim, MD [facility]  If 7PM-7AM, please contact night-coverage [email or web address] 04/28/2020, 12:51 PM

## 2020-04-28 NOTE — Progress Notes (Signed)
Pt hypertensive Yellow MEWS.  PRN  Medications given for Bp Provider and Charge RN made aware Pt denies any pain, Shortness of breath, or dizziness. Husband at the bedside and updated on plan of care Will recheck Bp.  Pt is requesting something to eat. At this time NPO awaiting speech evaluation.  Call bell within reach. Will continue to closely monitor.

## 2020-04-28 NOTE — Evaluation (Signed)
Clinical/Bedside Swallow Evaluation Patient Details  Name: Susan House MRN: [id number] Date of Birth: 05-19-1956  Today's Date: 04/28/2020 Time: SLP Start Time (ACUTE ONLY): 1210 SLP Stop Time (ACUTE ONLY): 1240 SLP Time Calculation (min) (ACUTE ONLY): 30 min  Past Medical History:  Past Medical History:  Diagnosis Date  . Allergy   . Arthritis   . Atypical chest pain    a. Reportedly nl cath in the past; b. 12/2009 MV: No ischemia/infarct. EF 87%.  . Carotid arterial disease ([place])    a. 07/8297 CTA head/neck: LICA 37J, RICA 69-->CVE rx.  . Cellulitis and abscess of face   . Chronic Dizziness   . Coronary artery calcification seen on CT scan    a. 10/2016 CTA chest: Atherosclerotic calcifications Ao, cors, and prox great vessels.  . Depression   . Diabetes mellitus ([place])   . Diastolic dysfunction    a. 2008 Echo: Nl EF; b. 2011 Echo: Nl EF; c. 06/2016 Echo: EF 55-60%, Gr1DD; d. 03/2018 Echo: EF 55-60%, no rwma, Gr1DD, mod dil LA.  . Edema   . GERD (gastroesophageal reflux disease)   . Headache   . Hematuria   . Hyperlipidemia   . Hypertension   . IBS (irritable bowel syndrome)   . Migraine   . Morbid obesity ([place])   . Nocturia   . Possible Seizure ([place])    a. 10/2016 ? sz. MRI neg for stroke.  . Stroke ([place]) 2000  . TIA (transient ischemic attack) 2010 & 2019   a. 2010; b. 03/2018 Left sided wkns-->MRI neg for stroke. CTA head/neck: Right A2 cut off/occlusion w/ collats extending over R cerebral convexity. LICA 93Y, RICA 45, RSubclavian 50->med rx.  . Urgency of micturation   . Urinary frequency   . Urinary incontinence    Past Surgical History:  Past Surgical History:  Procedure Laterality Date  . ABDOMINAL HYSTERECTOMY    . ABDOMINOPLASTY    . APPENDECTOMY    . BACK SURGERY     extensive spinal fusion  . carpel tunnel surgery    . CATARACT EXTRACTION     x 2  . CESAREAN SECTION    . CHOLECYSTECTOMY    . COLONOSCOPY  2010  . COLONOSCOPY WITH PROPOFOL N/A  07/11/2016   Procedure: COLONOSCOPY WITH PROPOFOL;  Surgeon: Lucilla Lame, MD;  Location: [facility] ENDOSCOPY;  Service: Endoscopy;  Laterality: N/A;  . COLONOSCOPY WITH PROPOFOL N/A 03/26/2018   Procedure: COLONOSCOPY WITH PROPOFOL;  Surgeon: Lucilla Lame, MD;  Location: [facility] ENDOSCOPY;  Service: Endoscopy;  Laterality: N/A;  . ESOPHAGOGASTRODUODENOSCOPY (EGD) WITH PROPOFOL N/A 03/26/2018   Procedure: ESOPHAGOGASTRODUODENOSCOPY (EGD) WITH PROPOFOL;  Surgeon: Lucilla Lame, MD;  Location: [facility] ENDOSCOPY;  Service: Endoscopy;  Laterality: N/A;  . EYE SURGERY    . HERNIA REPAIR    . SHOULDER SURGERY    . TOE SURGERY    . UPPER GI ENDOSCOPY    . WRIST FRACTURE SURGERY Left    HPI:  Susan House is a 64 y.o. female with a history of hypertension, CVA, diabetes mellitus with CKD, hyperlipidemia, seizure as late effect of CVA, being treated for recurrent UTI.fusion lumbar spine with hardware. Pt presented from home on 04/27/2020 by EMS for altered mental status, lethargy and weakness. Pt was vomiting at home.  CT head showed Generalized cerebral atrophy, redemonstrated large chronic infarct affecting the left basal ganglia and radiating white matter tracts, as well as ventral left   Assessment / Plan / Recommendation Clinical Impression  Pt presents with grossly adequate oropharyngeal abilities when consuming puree, soft solids and thin liquids via cup and straw. Pt has dentures in room. However they have been causing her to gag. Her husband reports that she eats solids without dentures at home. However, pt had significant difficulty masticating a bite of peanut butter graham crackers. This appeared related to ineffective mastication without her dentures in place. At this time, recommend dysphagia 2 diet for ease of mastication with thin liquids via cup or straw, medicine whole in puree and general aspiration precautions.  Pt's husband was present and extensive education provided. ST intervention is not  indicated as pt doesn't have an oropharyngeal dysphagia.  SLP Visit Diagnosis: Dysphagia, oropharyngeal phase (R13.12)    Aspiration Risk  Mild aspiration risk    Diet Recommendation Dysphagia 2 (Fine chop);Thin liquid   Liquid Administration via: Cup;Straw Medication Administration: Whole meds with puree Supervision: Patient able to self feed Compensations: Minimize environmental distractions;Slow rate;Small sips/bites Postural Changes: Seated upright at 90 degrees    Other  Recommendations Oral Care Recommendations: Oral care BID   Follow up Recommendations None      Frequency and Duration            Prognosis        Swallow Study   General Date of Onset: 04/27/20 HPI: Susan House is a 64 y.o. female with a history of hypertension, CVA, diabetes mellitus with CKD, hyperlipidemia, seizure as late effect of CVA, being treated for recurrent UTI.fusion lumbar spine with hardware. Pt presented from home on 04/27/2020 by EMS for altered mental status, lethargy and weakness. Pt was vomiting at home.  CT head showed Generalized cerebral atrophy, redemonstrated large chronic infarct affecting the left basal ganglia and radiating white matter tracts, as well as ventral left Type of Study: Bedside Swallow Evaluation Previous Swallow Assessment: none in chart Diet Prior to this Study: NPO Temperature Spikes Noted: No Respiratory Status: Room air History of Recent Intubation: No Behavior/Cognition: Alert;Cooperative;Pleasant mood Oral Cavity Assessment: Within Functional Limits Oral Care Completed by SLP: No Oral Cavity - Dentition: Poor condition;Missing dentition Vision: Functional for self-feeding Self-Feeding Abilities: Able to feed self Patient Positioning: Upright in bed Baseline Vocal Quality: Normal Volitional Cough: Strong Volitional Swallow: Able to elicit    Oral/Motor/Sensory Function Overall Oral Motor/Sensory Function: Within functional limits   Ice Chips  Ice chips: Not tested   Thin Liquid Thin Liquid: Within functional limits Presentation: Self Fed;Straw;Cup    Nectar Thick Nectar Thick Liquid: Not tested   Honey Thick Honey Thick Liquid: Not tested   Puree Puree: Within functional limits Presentation: Self Fed;Spoon   Solid     Solid: Impaired Presentation: Self Fed Oral Phase Impairments: Reduced lingual movement/coordination;Impaired mastication     Josefina Rynders B. Rutherford Nail M.S., CCC-SLP, [place] [facility] Rehabilitation Services Office [phone]  Ledarrius Beauchaine Rutherford Nail 04/28/2020,3:21 PM

## 2020-04-28 NOTE — Plan of Care (Signed)
Pt admitted to unit. Report received from ED RN. PT A&O. VSS. DBP elevated, pt has mild tremor when checking BP. PT denies pain. NO acute complaints. Will continue to monitor.  Problem: Education: Goal: Ability to describe self-care measures that may prevent or decrease complications (Diabetes Survival Skills Education) will improve Outcome: Progressing Goal: Individualized Educational Video(s) Outcome: Progressing   Problem: Coping: Goal: Ability to adjust to condition or change in health will improve Outcome: Progressing   Problem: Fluid Volume: Goal: Ability to maintain a balanced intake and output will improve Outcome: Progressing   Problem: Health Behavior/Discharge Planning: Goal: Ability to identify and utilize available resources and services will improve Outcome: Progressing Goal: Ability to manage health-related needs will improve Outcome: Progressing   Problem: Metabolic: Goal: Ability to maintain appropriate glucose levels will improve Outcome: Progressing   Problem: Nutritional: Goal: Maintenance of adequate nutrition will improve Outcome: Progressing Goal: Progress toward achieving an optimal weight will improve Outcome: Progressing   Problem: Skin Integrity: Goal: Risk for impaired skin integrity will decrease Outcome: Progressing   Problem: Tissue Perfusion: Goal: Adequacy of tissue perfusion will improve Outcome: Progressing

## 2020-04-28 NOTE — Progress Notes (Signed)
Initial Nutrition Assessment  DOCUMENTATION CODES:   Obesity unspecified  INTERVENTION:  Ensure Enlive po BID, each supplement provides 350 kcal and 20 grams of protein (strawberry)  MVI with minerals daily  Pt is at risk for refeeding given poor po prior to admission as well as electrolyte abnormalities per labs, recommend monitoring K, Mg and P daily as po intake improves  NUTRITION DIAGNOSIS:   Inadequate oral intake related to decreased appetite as evidenced by per patient/family report.   GOAL:   Patient will meet greater than or equal to 90% of their needs    MONITOR:   PO intake, Weight trends, Labs, I & O's, Supplement acceptance  REASON FOR ASSESSMENT:   Malnutrition Screening Tool    ASSESSMENT:  RD working remotely.  64 year old female with history of CAD, chronic dizziness, DM, GERD, HTN, HLD, IBS, left-sided weakness s/p TIA, urinary incontinence presented with acute onset of altered mental status, decreased energy level and vomiting at home. Pt admitted for DKA secondary to poorly controlled DM2 and MRSA bacteremia of unknown source.  Husband answered inpatient phone this afternoon, reports pt does not feel like talking and he provides nutrition history. Husband reports he usually prepares meals, recalls cereal or oatmeal with toast for breakfast, assorted sandwiches with unsalted chips for lunch, states pt likes liver pudding and prepares balanced dinners recalling chicken, chinese, beef stew, soups, roasts, salads, green beans and potatoes. Pt drinks Coke or Pepsi Zero and water. Diet advanced to dysphagia 2 this afternoon, per husband she had a few bites of pineapple and a couple spoonfuls of ice cream from lunch tray. Says pt has not been eating very much lately and likely has lost some weight, however he is unsure of usual weight. Per chart, weights have trended down ~26 lb (11.2%) from usual wt in the past 2 months; significant. Given decreased intake as well  as recent trends, suspect degree of malnutrition however unable to identify at this time. She is currently eating bites of meals and would benefit from nutrient dense supplement. One Ensure Enlive supplement provides 350 kcals, 20 grams protein, and 44-45 grams of carbohydrate vs one Glucerna shake supplement, which provides 220 kcals, 10 grams of protein, and 26 grams of carbohydrate. Given pt's hx of DM, RD will reassess adequacy of PO intake, CBGS, and adjust supplement regimen as appropriate and complete exam at follow-up.   03/24/20 104.8 kg  02/11/20 (!) 104.8 kg  12/25/19 80 kg  11/10/19 104.2 kg  07/19/19 105 kg  07/03/19 105.2 kg  05/29/19 104.8 kg  02/25/19 102.1 kg  01/06/19 104.3 kg    Medications reviewed and include: Gabapentin, SSI, Klor-con, Vancomycin IVF: NaCl with KCl 40 MEq/L @ 100 ml/hr  Labs: CBGs 169,163, K 3.1 (L), Cr 1.37 (H), Mg 1.4 (L) 10/12 A1c 9.6  NUTRITION - FOCUSED PHYSICAL EXAM:  Unable to complete at this time, RD working remotely.   Diet Order:   Diet Order            DIET DYS 2 Room service appropriate? Yes; Fluid consistency: Thin  Diet effective now                 EDUCATION NEEDS:   Not appropriate for education at this time  Skin:  Skin Assessment: Skin Integrity Issues: Skin Integrity Issues:: Other (Comment) Other: rash; bilateral breast;groin  Last BM:  pta  Height:   Ht Readings from Last 1 Encounters:  04/27/20 5\' 3"  (1.6 m)  Weight:   Wt Readings from Last 1 Encounters:  04/28/20 93.1 kg    BMI:  Body mass index is 36.35 kg/m.  Estimated Nutritional Needs:   Kcal:  1800-2000  Protein:  90-100  Fluid:  >/= 1.8 L/day   Lajuan Lines, RD, LDN Clinical Nutrition After Hours/Weekend Pager # in [place]

## 2020-04-29 DIAGNOSIS — R7881 Bacteremia: Secondary | ICD-10-CM | POA: Diagnosis not present

## 2020-04-29 LAB — BASIC METABOLIC PANEL
Anion gap: 9 (ref 5–15)
BUN: 15 mg/dL (ref 8–23)
CO2: 20 mmol/L — ABNORMAL LOW (ref 22–32)
Calcium: 7.9 mg/dL — ABNORMAL LOW (ref 8.9–10.3)
Chloride: 111 mmol/L (ref 98–111)
Creatinine, Ser: 1.33 mg/dL — ABNORMAL HIGH (ref 0.44–1.00)
GFR, Estimated: 42 mL/min — ABNORMAL LOW (ref >60)
Glucose, Bld: 169 mg/dL — ABNORMAL HIGH (ref 70–99)
Potassium: 4.6 mmol/L (ref 3.5–5.1)
Sodium: 140 mmol/L (ref 135–145)

## 2020-04-29 LAB — CBC WITH DIFFERENTIAL/PLATELET
Abs Immature Granulocytes: 0.08 10*3/uL — ABNORMAL HIGH (ref 0.00–0.07)
Basophils Absolute: 0.1 10*3/uL (ref 0.0–0.1)
Basophils Relative: 1 %
Eosinophils Absolute: 0 10*3/uL (ref 0.0–0.5)
Eosinophils Relative: 1 %
HCT: 30 % — ABNORMAL LOW (ref 36.0–46.0)
Hemoglobin: 9.7 g/dL — ABNORMAL LOW (ref 12.0–15.0)
Immature Granulocytes: 1 %
Lymphocytes Relative: 17 %
Lymphs Abs: 1 10*3/uL (ref 0.7–4.0)
MCH: 27.9 pg (ref 26.0–34.0)
MCHC: 32.3 g/dL (ref 30.0–36.0)
MCV: 86.2 fL (ref 80.0–100.0)
Monocytes Absolute: 0.9 10*3/uL (ref 0.1–1.0)
Monocytes Relative: 14 %
Neutro Abs: 4.2 10*3/uL (ref 1.7–7.7)
Neutrophils Relative %: 66 %
Platelets: 242 10*3/uL (ref 150–400)
RBC: 3.48 MIL/uL — ABNORMAL LOW (ref 3.87–5.11)
RDW: 15.3 % (ref 11.5–15.5)
WBC: 6.3 10*3/uL (ref 4.0–10.5)
nRBC: 0 % (ref 0.0–0.2)

## 2020-04-29 LAB — ECHOCARDIOGRAM COMPLETE
AR max vel: 1.7 cm2
AV Area VTI: 1.56 cm2
AV Area mean vel: 1.57 cm2
AV Mean grad: 5 mmHg
AV Peak grad: 9.5 mmHg
Ao pk vel: 1.54 m/s
Area-P 1/2: 3.53 cm2
Height: 63 in
S' Lateral: 2.43 cm
Weight: 3283.2 oz

## 2020-04-29 LAB — MAGNESIUM: Magnesium: 1.9 mg/dL (ref 1.7–2.4)

## 2020-04-29 LAB — GLUCOSE, CAPILLARY
Glucose-Capillary: 160 mg/dL — ABNORMAL HIGH (ref 70–99)
Glucose-Capillary: 314 mg/dL — ABNORMAL HIGH (ref 70–99)
Glucose-Capillary: 165 mg/dL — ABNORMAL HIGH (ref 70–99)
Glucose-Capillary: 224 mg/dL — ABNORMAL HIGH (ref 70–99)

## 2020-04-29 LAB — VANCOMYCIN, TROUGH: Vancomycin Tr: 29 ug/mL (ref 15–20)

## 2020-04-29 MED ORDER — HYDROCHLOROTHIAZIDE 25 MG PO TABS
25.0000 mg | ORAL_TABLET | Freq: Every day | ORAL | Status: DC
  Administered 2020-04-29 – 2020-05-02 (×4): 25 mg via ORAL
  Filled 2020-04-29 (×4): qty 1

## 2020-04-29 MED ORDER — POTASSIUM CHLORIDE CRYS ER 20 MEQ PO TBCR: 40.0000 meq | EXTENDED_RELEASE_TABLET | Freq: Two times a day (BID) | ORAL | Status: DC

## 2020-04-29 MED ORDER — VANCOMYCIN HCL IN DEXTROSE 1-5 GM/200ML-% IV SOLN
1000.0000 mg | INTRAVENOUS | Status: DC
  Administered 2020-04-30: 1000 mg via INTRAVENOUS
  Filled 2020-04-29 (×3): qty 200

## 2020-04-29 MED ORDER — ENOXAPARIN SODIUM 60 MG/0.6ML ~~LOC~~ SOLN
0.5000 mg/kg | SUBCUTANEOUS | Status: DC
  Administered 2020-04-29 – 2020-05-02 (×4): 47.5 mg via SUBCUTANEOUS
  Filled 2020-04-29 (×4): qty 0.6

## 2020-04-29 MED ORDER — INSULIN GLARGINE 100 UNIT/ML ~~LOC~~ SOLN
9.0000 [IU] | Freq: Every day | SUBCUTANEOUS | Status: DC
  Administered 2020-04-30: 9 [IU] via SUBCUTANEOUS
  Filled 2020-04-29: qty 0.09

## 2020-04-29 NOTE — Progress Notes (Signed)
ID  As per her husband she was throwing up on Sunday 04/25/20 and it got worse on monday , she was confused and she fell he called EMS on Monday night Since jan 2020 pt has had multiple falls- with contusions, fracture ribs , fracture of toe, and on another occasion in April 2021  fracture of ulna styloid.   Awake and alert Says she is feeling much better  Patient Vitals for the past 24 hrs:  BP Temp Temp src Pulse Resp SpO2 Weight  04/29/20 1642 (!) 171/74 97.7 F (36.5 C) -- 68 19 98 % --  04/29/20 1219 (!) 166/76 97.8 F (36.6 C) -- 78 19 96 % --  04/29/20 0903 -- (!) 97.5 F (36.4 C) Oral 93 17 96 % --  04/29/20 0334 (!) 171/71 99.4 F (37.4 C) Oral 78 -- 98 % 95.5 kg  04/28/20 2039 (!) 184/80 98.7 F (37.1 C) Oral 78 19 97 % --  Awake and alert- oriented in person, place , time cheast B/L air entry [id number] abd soft Sacrum no decub bacK linear bruise   Left hemiparesis Left hand contractures No skin lesions abd soft  labs   CBC Latest Ref Rng & Units 04/29/2020 04/27/2020 12/24/2019  WBC 4.0 - 10.5 K/uL 6.3 11.4(H) 6.6  Hemoglobin 12.0 - 15.0 g/dL 9.7(L) 11.6(L) 11.1(L)  Hematocrit 36 - 46 % 30.0(L) 35.1(L) 33.6(L)  Platelets 150 - 400 K/uL 242 265 243    CMP Latest Ref Rng & Units 04/29/2020 04/28/2020 04/28/2020  Glucose 70 - 99 mg/dL 169(H) 195(H) -  BUN 8 - 23 mg/dL 15 15 -  Creatinine 0.44 - 1.00 mg/dL 1.33(H) 1.37(H) 1.39(H)  Sodium 135 - 145 mmol/L 140 143 -  Potassium 3.5 - 5.1 mmol/L 4.6 3.1(L) -  Chloride 98 - 111 mmol/L 111 107 -  CO2 22 - 32 mmol/L 20(L) 21(L) -  Calcium 8.9 - 10.3 mg/dL 7.9(L) 8.2(L) -  Total Protein 6.5 - 8.1 g/dL - 6.0(L) -  Total Bilirubin 0.3 - 1.2 mg/dL - 1.0 -  Alkaline Phos 38 - 126 U/L - 55 -  AST 15 - 41 U/L - 27 -  ALT 0 - 44 U/L - 13 -  Ct head  Generalized cerebral atrophy. Redemonstrated large chronic infarct affecting the left basal ganglia and radiating white matter tracts, as well as ventral left thalamus.  Associated ex vacuo dilatation of the left lateral ventricle.  Redemonstrated chronic lacunar infarct within the right thalamocapsular junction.  Known small chronic cortically based infarcts within the posterior right frontal lobe and anterolateral left frontal lobe were better appreciated on the brain MRI of 12/23/2019.  Blood culture-left AC- MRSA, staph simulans and enterococcus  Rt had BC- NG Impression/recommendation  ?Altered mental status- resolved With underlying CVA not sure what her baseline mental status is  DKA - treated with fluids, insulin drip  MRSA -along with staph simulans and enterococcus in blood culture from the left cubital fossa -- source currently not clear This could very well be contaminant ( but  MRSA cannot be considered a contaminant)   Will repeat another blood culture  She has fusion hardware in her spine but site okay  2 d echo okay .will need  TEE to decide on duration of antibiotic  UTI is questioned as  urine analysis > 50 WBC  post void bladder scan- no urine  CVA- many  old infarcts Also has left hemiparesis and expressive and receptive dysphasia   Discussed with patient  and her husband Dr.fitzgerald will cover ID tomorrow.

## 2020-04-29 NOTE — Progress Notes (Signed)
PROGRESS NOTE    Susan House  BPZ:[id number] DOB: [birth year] DOA: 04/27/2020 PCP: Jerrol Banana., MD   Susan House  is a 64 y.o. Caucasian female with a known history of multiple medical problems that are mentioned below including hypertension, dyslipidemia, CVA and TIA, who presented to the emergency room with acute onset of altered mental status.  She has been having decreased energy level and vomiting at home.  She is a very poor historian and has not been able to provide any meaningful history.  She was tachycardic and tachypneic upon arrival.  She was having extensive fecal material around her and several bruises.  No reported chest pain or palpitations.  No reported cough or wheezing or dyspnea.  It is unclear if she has been having urinary symptoms including dysuria or frequency but she answers yes to all questions.  Upon presentation to the emergency room blood pressure was 167/92 with a heart rate of 126 and respiratory to 26 with pulse symmetry of 98% on room air.  Labs revealed ABG with pH 7.37 and HCO3 of 12.7 and BMP revealed a CO2 of 14 with a blood glucose of 448 and a creatinine of 1.9 with anion gap of 23 magnesium of 1.3 and total bili of 2.1 with lactic acid of 2.1 later 2.3 and CBC showed leukocytosis 11.4 with relative neutrophilia.  Beta hydroxybutyrate was 7.11.  COVID-19 PCR and influenza antigens came back negative.  UA showed more than 50 WBCs more than 300 protein and 20 ketones and more than 500 glucose.  The patient was given 2 g of IV magnesium sulfate, total bolus of IV lactated Ringer followed by 125 mL/h; of IV potassium chloride.  She will be admitted to a stepdown unit for further evaluation and management.     Assessment & Plan:   Active Problems:   DKA (diabetic ketoacidosis) ([place])   DKA: secondary to poorly controlled DM2 and bloodstream infection. Resolved. Glucose today mid 100s. Off insulin gtt.  - stop fluids, is taking  adequate PO - increase lantus from 7 to 9, continue SSI  Bacteremia  Sepsis:  presented w/ tachycardia, tachypnea & bacteremia & possible UTI. Has spinal hardware, no tenderness at site. Hemodynamically stable currently - appreciate ID recs - continue vancomycin - f/u TTE, performed last night, read pending  # Hypokalemia - k and mg normal today - monitor  Encephalopathy: likely metabolic & secondary to above. CT head neg for acute intracranial findings, but atrophy & chronic infarcts present. NPO until speech can see the pt  - slp consulted - dysphagia 2 diet, thin liquids  AKI: likely prerenal from dehydration. 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 Thinks is in hospital in [place], year 2001. Denies pain or cough. Has appetite. No ha.   Objective: Vitals:   04/28/20  1719 04/28/20 2039 04/29/20 0334 04/29/20 0903  BP: (!) 163/91 (!) 184/80 (!) 171/71   Pulse: 98 78 78 93  Resp: 18 19  17   Temp: 97.9 F (36.6 C) 98.7 F (37.1 C) 99.4 F (37.4 C) (!) 97.5 F (36.4 C)  TempSrc:  Oral Oral Oral  SpO2: 98% 97% 98% 96%  Weight:   95.5 kg   Height:        Intake/Output Summary (Last 24 hours) at 04/29/2020 1100 Last data filed at 04/29/2020 0511 Gross per 24 hour  Intake 2803.84 ml  Output 1700 ml  Net 1103.84 ml    Filed Weights   04/27/20 0037 04/28/20 0519 04/29/20 0334  Weight: 91 kg 93.1 kg 95.5 kg    Examination:  General exam: Alert, pleasant. Morbidly obese Respiratory system: diminished breath sounds b/l  Cardiovascular system: S1 & S2 +. No ubs, gallops or clicks. No pedal edema. Gastrointestinal system: Abdomen is obese, soft and nontender. Hypoactive bowel sounds Central nervous system: Awake, alert, moving all 4 extremities. Can answer simple questions Psychiatry: Judgement and insight appear abnormal.      Data Reviewed: I have personally reviewed following labs and imaging studies  CBC: Recent Labs  Lab 04/27/20 0055  WBC 11.4*  NEUTROABS 10.4*  HGB 11.6*  HCT 35.1*  MCV 85.8  PLT 950   Basic Metabolic Panel: Recent Labs  Lab 04/27/20 0055 04/27/20 0055 04/27/20 0522 04/27/20 0919 04/28/20 0547 04/28/20 1253 04/29/20 0535  NA 142  --  143 140  --  143 140  K 3.6  --  3.5 3.0*  --  3.1* 4.6  CL 105  --  109 108  --  107 111  CO2 14*  --  15* 22  --  21* 20*  GLUCOSE 448*  --  182* 172*  --  195* 169*  BUN 22  --  20 17  --  15 15  CREATININE 1.90*   < > 1.71* 1.44* 1.39* 1.37* 1.33*  CALCIUM 8.6*  --  8.1* 8.0*  --  8.2* 7.9*  MG 1.3*  --  1.4*  --   --  1.4* 1.9   < > = values in this interval not displayed.   GFR: Estimated Creatinine Clearance: 47 mL/min (A) (by C-G formula based on SCr of 1.33 mg/dL (H)). Liver Function Tests: Recent Labs  Lab 04/27/20 0055 04/28/20 1253  AST 24 27  ALT 14 13  ALKPHOS 67 55  BILITOT 2.1* 1.0  PROT 7.4 6.0*  ALBUMIN 3.5 2.9*   No results for input(s): LIPASE, AMYLASE in the last 168 hours. No results for input(s): AMMONIA in the last 168 hours. Coagulation Profile: Recent Labs  Lab 04/27/20 0055  INR 1.0   Cardiac Enzymes: No results for input(s): CKTOTAL, CKMB, CKMBINDEX, TROPONINI in the last 168 hours. BNP (last 3 results) No results for input(s): PROBNP in the last 8760 hours. HbA1C: Recent  Labs    04/27/20 0522  HGBA1C 9.6*   CBG: Recent Labs  Lab 04/28/20 1157 04/28/20 1642 04/28/20 2016 04/28/20 2250 04/29/20 0921  GLUCAP 169* 273* 251* 138* 160*   Lipid Profile: No results for input(s): CHOL, HDL, LDLCALC, TRIG, CHOLHDL, LDLDIRECT in the last 72 hours. Thyroid Function Tests: No results for input(s): TSH, T4TOTAL, FREET4, T3FREE, THYROIDAB in the last 72 hours. Anemia Panel: No results for input(s): VITAMINB12, FOLATE, FERRITIN, TIBC, IRON, RETICCTPCT in the last 72 hours. Sepsis Labs: Recent Labs  Lab 04/27/20 0343 04/27/20 9326  04/27/20 0919 04/27/20 1821  LATICACIDVEN 2.3* 4.7* 2.0* 0.8    Recent Results (from the past 240 hour(s))  Blood Culture (routine x 2)     Status: None (Preliminary result)   Collection Time: 04/27/20 12:55 AM   Specimen: BLOOD  Result Value Ref Range Status   Specimen Description   Final    BLOOD LEFT AC Performed at [facility], [address], [place], [place] [address]    Special Requests   Final    BOTTLES DRAWN AEROBIC AND ANAEROBIC Blood Culture results may not be optimal due to an excessive volume of blood received in culture bottles Performed at [facility], [address] [place], [place] [address]    Culture  Setup Time   Final    GRAM POSITIVE COCCI IN BOTH AEROBIC AND ANAEROBIC BOTTLES CRITICAL RESULT CALLED TO, READ BACK BY AND VERIFIED WITH: SUSAN WATSON AT 6213 ON 04/27/2020 [place].    Culture   Final    GRAM POSITIVE COCCI IDENTIFICATION TO FOLLOW Performed at [facility], [place] [address], [place], [place] [address]    Report Status PENDING  Incomplete  Blood Culture (routine x 2)     Status: None (Preliminary result)   Collection Time: 04/27/20 12:55 AM   Specimen: BLOOD  Result Value Ref Range Status   Specimen Description BLOOD RIGTH HAND  Final   Special Requests   Final    BOTTLES DRAWN AEROBIC AND ANAEROBIC Blood Culture adequate volume   Culture   Final    NO  GROWTH 2 DAYS Performed at [facility], [address] [place], [place] [address]    Report Status PENDING  Incomplete  Respiratory Panel by RT PCR (Flu A&B, Covid) - Nasopharyngeal Swab     Status: None   Collection Time: 04/27/20 12:55 AM   Specimen: Nasopharyngeal Swab  Result Value Ref Range Status   SARS Coronavirus 2 by RT PCR NEGATIVE NEGATIVE Final    Comment: (NOTE) SARS-CoV-2 target nucleic acids are NOT DETECTED.  The SARS-CoV-2 RNA is generally detectable in upper respiratoy specimens during the acute phase of infection. The lowest concentration of SARS-CoV-2 viral copies this assay can detect is 131 copies/mL. A negative result does not preclude SARS-Cov-2 infection and should not be used as the sole basis for treatment or other patient management decisions. A negative result may occur with  improper specimen collection/handling, submission of specimen other than nasopharyngeal swab, presence of viral mutation(s) within the areas targeted by this assay, and inadequate number of viral copies (<131 copies/mL). A negative result must be combined with clinical observations, patient history, and epidemiological information. The expected result is Negative.  Fact Sheet for Patients:  PinkCheek.be  Fact Sheet for Healthcare Providers:  GravelBags.it  This test is no t yet approved or cleared by the [place] FDA and  has been authorized for detection and/or diagnosis of SARS-CoV-2 by FDA under an Emergency Use Authorization (EUA). This EUA will remain  in effect (meaning this test can be used) for the duration of the COVID-19 declaration under Section 564(b)(1) of the Act, 21 U.S.C. section 360bbb-3(b)(1), unless the authorization is terminated or revoked sooner.     Influenza A by PCR NEGATIVE NEGATIVE Final   Influenza B by PCR NEGATIVE NEGATIVE Final    Comment: (NOTE) The Xpert Xpress  SARS-CoV-2/FLU/RSV assay is intended as an aid in  the diagnosis of influenza from Nasopharyngeal swab specimens and  should not be used as  a sole basis for treatment. Nasal washings and  aspirates are unacceptable for Xpert Xpress SARS-CoV-2/FLU/RSV  testing.  Fact Sheet for Patients: PinkCheek.be  Fact Sheet for Healthcare Providers: GravelBags.it  This test is not yet approved or cleared by the [place] FDA and  has been authorized for detection and/or diagnosis of SARS-CoV-2 by  FDA under an Emergency Use Authorization (EUA). This EUA will remain  in effect (meaning this test can be used) for the duration of the  Covid-19 declaration under Section 564(b)(1) of the Act, 21  U.S.C. section 360bbb-3(b)(1), unless the authorization is  terminated or revoked. Performed at [facility], [address] [place], [place] [address]   Blood Culture ID Panel (Reflexed)     Status: Abnormal   Collection Time: 04/27/20 12:55 AM  Result Value Ref Range Status   Enterococcus faecalis NOT DETECTED NOT DETECTED Final   Enterococcus Faecium NOT DETECTED NOT DETECTED Final   Listeria monocytogenes NOT DETECTED NOT DETECTED Final   Staphylococcus species DETECTED (A) NOT DETECTED Final    Comment: CRITICAL RESULT CALLED TO, READ BACK BY AND VERIFIED WITH: SUSAN WATSON AT 1424 ON 04/27/2020 [place].    Staphylococcus aureus (BCID) DETECTED (A) NOT DETECTED Final    Comment: CRITICAL RESULT CALLED TO, READ BACK BY AND VERIFIED WITH: SUSAN WATSON AT 1424 ON 04/27/2020 [place].    Staphylococcus epidermidis DETECTED (A) NOT DETECTED Final    Comment: CRITICAL RESULT CALLED TO, READ BACK BY AND VERIFIED WITH: SUSAN WATSON AT 1424 ON 04/27/2020 [place].    Staphylococcus lugdunensis NOT DETECTED NOT DETECTED Final   Streptococcus species NOT DETECTED NOT DETECTED Final   Streptococcus agalactiae NOT DETECTED NOT DETECTED Final    Streptococcus pneumoniae NOT DETECTED NOT DETECTED Final   Streptococcus pyogenes NOT DETECTED NOT DETECTED Final   A.calcoaceticus-baumannii NOT DETECTED NOT DETECTED Final   Bacteroides fragilis NOT DETECTED NOT DETECTED Final   Enterobacterales NOT DETECTED NOT DETECTED Final   Enterobacter cloacae complex NOT DETECTED NOT DETECTED Final   Escherichia coli NOT DETECTED NOT DETECTED Final   Klebsiella aerogenes NOT DETECTED NOT DETECTED Final   Klebsiella oxytoca NOT DETECTED NOT DETECTED Final   Klebsiella pneumoniae NOT DETECTED NOT DETECTED Final   Proteus species NOT DETECTED NOT DETECTED Final   Salmonella species NOT DETECTED NOT DETECTED Final   Serratia marcescens NOT DETECTED NOT DETECTED Final   Haemophilus influenzae NOT DETECTED NOT DETECTED Final   Neisseria meningitidis NOT DETECTED NOT DETECTED Final   Pseudomonas aeruginosa NOT DETECTED NOT DETECTED Final   Stenotrophomonas maltophilia NOT DETECTED NOT DETECTED Final   Candida albicans NOT DETECTED NOT DETECTED Final   Candida auris NOT DETECTED NOT DETECTED Final   Candida glabrata NOT DETECTED NOT DETECTED Final   Candida krusei NOT DETECTED NOT DETECTED Final   Candida parapsilosis NOT DETECTED NOT DETECTED Final   Candida tropicalis NOT DETECTED NOT DETECTED Final   Cryptococcus neoformans/gattii NOT DETECTED NOT DETECTED Final   Methicillin resistance mecA/C DETECTED (A) NOT DETECTED Final    Comment: CRITICAL RESULT CALLED TO, READ BACK BY AND VERIFIED WITH: SUSAN WATSON AT 1424 ON 04/27/2020 [place].    Meth resistant mecA/C and MREJ NOT DETECTED NOT DETECTED Final    Comment: Performed at [facility], [address] [place], [place] [address]  Urine culture     Status: Abnormal   Collection Time: 04/27/20  1:57 AM   Specimen: In/Out Cath Urine  Result Value Ref Range Status   Specimen Description  Final    IN/OUT CATH URINE Performed at [facility], [address] [place], [place]  [address]    Special Requests   Final    NONE Performed at [facility], [address] [place], [place] [address]    Culture MULTIPLE SPECIES PRESENT, SUGGEST RECOLLECTION (A)  Final   Report Status 04/27/2020 FINAL  Final         Radiology Studies: CT HEAD WO CONTRAST  Result Date: 04/27/2020 CLINICAL DATA:  Delirium. Additional provided: Acute onset altered mental status, decreased energy level, vomiting at home. EXAM: CT HEAD WITHOUT CONTRAST TECHNIQUE: Contiguous axial images were obtained from the base of the skull through the vertex without intravenous contrast. COMPARISON:  Brain MRI 12/23/2019, head CT 12/23/2019. FINDINGS: Brain: Generalized cerebral atrophy. Redemonstrated large chronic infarct affecting the left basal ganglia and radiating white matter tracts, as well as ventral left thalamus. Associated ex vacuo dilatation of the left lateral ventricle. Redemonstrated chronic lacunar infarct within the right thalamocapsular junction. Known small chronic cortically based infarcts within the posterior right frontal lobe and anterolateral left frontal lobe were better appreciated on the brain MRI of 12/23/2019. Stable background mild multifocal ill-defined hypoattenuation within the cerebral white matter which is nonspecific, but consistent with chronic small vessel ischemic disease. There is no acute intracranial hemorrhage. No acute demarcated cortical infarct. No extra-axial fluid collection. No evidence of intracranial mass. No midline shift. Vascular: No hyperdense vessel.  Atherosclerotic calcifications Skull: Normal. Negative for fracture or focal lesion. Sinuses/Orbits: Visualized orbits show no acute finding. Chronic medially displaced deformity of the right lamina papyracea. Minimal ethmoid sinus mucosal thickening. No significant mastoid effusion. IMPRESSION: No evidence of acute intracranial abnormality. Cerebral atrophy and chronic ischemic changes with multiple  chronic infarcts as outlined and without interval change from the MRI of 12/23/2019. Mild ethmoid sinus mucosal thickening. Electronically Signed   By: Kellie Simmering DO   On: 04/27/2020 12:36        Scheduled Meds: . amLODipine  10 mg Oral Daily  . aspirin  81 mg Oral Daily  . atorvastatin  20 mg Oral q1800  . clopidogrel  75 mg Oral Daily  . enalapril  20 mg Oral BID  . enoxaparin (LOVENOX) injection  0.5 mg/kg Subcutaneous Q24H  . feeding supplement  237 mL Oral BID BM  . gabapentin  200 mg Oral BID  . hydrALAZINE  100 mg Oral BID  . hydrochlorothiazide  25 mg Oral Daily  . insulin aspart  0-9 Units Subcutaneous TID WC  . [START ON 04/30/2020] insulin glargine  9 Units Subcutaneous Daily  . levETIRAcetam  500 mg Oral BID  . metoprolol succinate  100 mg Oral Daily  . multivitamin with minerals  1 tablet Oral Daily  . [START ON 04/30/2020] potassium chloride  40 mEq Oral BID   Continuous Infusions: . vancomycin 750 mg (04/29/20 0438)     LOS: 2 days    Time spent: 25 mins     Desma Maxim, MD [facility]  If 7PM-7AM, please contact night-coverage [email or web address] 04/29/2020, 11:00 AM

## 2020-04-29 NOTE — Progress Notes (Signed)
Mobility Specialist - Progress Note   04/29/20 1515  Mobility  Activity Dangled on edge of bed (bed mobility)  Level of Assistance Moderate assist, patient does 50-74% (max. assist w/ some bed mobility d/t weak L side)  Assistive Device Front wheel walker  Mobility Response Tolerated well  Mobility performed by Mobility specialist  $Mobility charge 1 Mobility    Pt laying in bed upon arrival. Pt agreed to session. Pt transitioned from supine to sit EOB w/ max. Assist d/t L side weakness from hx of stroke. Pt dangled EOB for a couple of minutes. Noticed soiled linens. NT notified. Pt progressed to seated exercises: kicks x 10, marches x 10, ankle pumps x 10. Further mobility limited d/t pt having L side weakness and feeling fatigue after seated ex. Pt laid back to bed w/ max assist. Clinicians from lab entered the room at the end of session. Pt left laying in bed w/ alarm set and lab clinicians present in room.     Ginnette Gates Mobility Specialist  04/29/20, 3:21 PM

## 2020-04-29 NOTE — Progress Notes (Signed)
Pharmacy Antibiotic Note  Susan House is a 64 y.o. female admitted on 04/27/2020 with bacteremia likely secondary to UTI. PMH for HTN, dyslipidemia, CVA and TIA. Patient was started on ceftriaxone and BCx 1/2 sets (both bottles) with GPC S. Aureus and epidermidis, methicillin resistance detected. Interestingly, culture has growing S. Aureus and E. Faecalis. Pharmacy has been consulted for vancomycin dosing.   Today, 04/29/2020 Day #3 vancomycin  Scr 1.44>1.37>1.33  WBC 11.4>6.3  Afebrile  TTE results pending  Blood cx with Enterococcus faecalis and S. Aureus  Source no clear at this time  Trough 10/14 at 15:00 = 29 - supratherapeutic (prior to 5th total dose, incl loading dose)  Plan:  Vanc trough supratherapeutic, will change Vancomycin 750mg  IV q12h to Vancomycin 1000mg  IV q24h  Trough today, goal = 15-20 mcg/ml  Continue to monitor renal function  Await susceptibilities  Height: 5\' 3"  (160 cm) Weight: 95.5 kg (210 lb 9.6 oz) IBW/kg (Calculated) : 52.4  Temp (24hrs), Avg:98.3 F (36.8 C), Min:97.5 F (36.4 C), Max:99.4 F (37.4 C)  Recent Labs  Lab 04/27/20 0055 04/27/20 0055 04/27/20 0343 04/27/20 0522 04/27/20 0610 04/27/20 0919 04/27/20 1821 04/28/20 0547 04/28/20 1253 04/29/20 0535 04/29/20 1134 04/29/20 1500  WBC 11.4*  --   --   --   --   --   --   --   --   --  6.3  --   CREATININE 1.90*   < >  --  1.71*  --  1.44*  --  1.39* 1.37* 1.33*  --   --   LATICACIDVEN 2.1*  --  2.3*  --  4.7* 2.0* 0.8  --   --   --   --   --   VANCOTROUGH  --   --   --   --   --   --   --   --   --   --   --  29*   < > = values in this interval not displayed.    Estimated Creatinine Clearance: 47 mL/min (A) (by C-G formula based on SCr of 1.33 mg/dL (H)).    Allergies  Allergen Reactions  . Morphine And Related Anaphylaxis  . Reglan [Metoclopramide]     Other reaction(s): Unknown Elevated BP  . Simvastatin Other (See Comments)    Muscle pain  . Betadine  [Povidone Iodine] Rash  . Tetracyclines & Related Rash    Other reaction(s): Unknown    Antimicrobials this admission: 10/12 Ceftriaxone >> 10/13 10/12 Vancomycin >>   Microbiology results: 10/12 BCx: GPC 1/2 sets (both bottles) S. Aureus and epidermidis, methicillin resistance detected. Cx growing S. Aureus and E. faecalis 10/12 UCx: mx species 10/14 BCx: pending  Thank you for allowing pharmacy to be a part of this patient's care.  Sherilyn Banker, PharmD Pharmacy Resident  04/29/2020 8:26 PM

## 2020-04-29 NOTE — Progress Notes (Addendum)
Pharmacy Antibiotic Note  Susan House is a 64 y.o. female admitted on 04/27/2020 with bacteremia likely secondary to UTI. PMH for HTN, dyslipidemia, CVA and TIA. Patient was started on ceftriaxone and BCx 1/2 sets (both bottles) with GPC S. Aureus and epidermidis, methicillin resistance detected. Interestingly, culture has growing S. Aureus and E. Faecalis.  Pharmacy has been consulted for vancomycin dosing.   Today, 04/29/2020 Day #3 vancomycin  Scr relatively stable at 1.33 today  WBC WNL  Afebrile  TTE results pending  Blood cx with Enterococcus faecalis and S. Aureus  Source no clear at this time  Trough 10/14 at 15:00 = _____ (prior to 5th total dose, incl loading dose)  Plan:  Continue Vancomycin 750mg  IV q12h per dosing nomogram  Trough today, goal = 15-20 mcg/ml  Monitor renal function  Await susceptibilities  F/u order for repeat blood cultures  Monitor renal function and adjust dose as clinically indicated Obtain vanc level prior to 4th or 5th dose  Height: 5\' 3"  (160 cm) Weight: 95.5 kg (210 lb 9.6 oz) IBW/kg (Calculated) : 52.4  Temp (24hrs), Avg:98.5 F (36.9 C), Min:97.5 F (36.4 C), Max:99.5 F (37.5 C)  Recent Labs  Lab 04/27/20 0055 04/27/20 0055 04/27/20 0343 04/27/20 0522 04/27/20 0610 04/27/20 0919 04/27/20 1821 04/28/20 0547 04/28/20 1253 04/29/20 0535 04/29/20 1134  WBC 11.4*  --   --   --   --   --   --   --   --   --  6.3  CREATININE 1.90*   < >  --  1.71*  --  1.44*  --  1.39* 1.37* 1.33*  --   LATICACIDVEN 2.1*  --  2.3*  --  4.7* 2.0* 0.8  --   --   --   --    < > = values in this interval not displayed.    Estimated Creatinine Clearance: 47 mL/min (A) (by C-G formula based on SCr of 1.33 mg/dL (H)).    Allergies  Allergen Reactions  . Morphine And Related Anaphylaxis  . Reglan [Metoclopramide]     Other reaction(s): Unknown Elevated BP  . Simvastatin Other (See Comments)    Muscle pain  . Betadine [Povidone  Iodine] Rash  . Tetracyclines & Related Rash    Other reaction(s): Unknown    Antimicrobials this admission: 10/12 Ceftriaxone >> 10/13 10/12 Vancomycin >>   Microbiology results: 10/12 BCx: GPC 1/2 sets (both bottles) S. Aureus and epidermidis, methicillin resistance detected.  Cx growing S. Aureus and E. faecalis 10/12 UCx: mx species  Thank you for allowing pharmacy to be a part of this patient's care.  Doreene Eland, PharmD, BCPS.   Work Cell: [phone] 04/29/2020 12:48 PM

## 2020-04-29 NOTE — Plan of Care (Signed)

## 2020-04-30 DIAGNOSIS — R7881 Bacteremia: Secondary | ICD-10-CM | POA: Diagnosis not present

## 2020-04-30 DIAGNOSIS — L8990 Pressure ulcer of unspecified site, unspecified stage: Secondary | ICD-10-CM | POA: Insufficient documentation

## 2020-04-30 LAB — CBC WITH DIFFERENTIAL/PLATELET
Abs Immature Granulocytes: 0.08 10*3/uL — ABNORMAL HIGH (ref 0.00–0.07)
Basophils Absolute: 0.1 10*3/uL (ref 0.0–0.1)
Basophils Relative: 1 %
Eosinophils Absolute: 0.1 10*3/uL (ref 0.0–0.5)
Eosinophils Relative: 2 %
HCT: 30.3 % — ABNORMAL LOW (ref 36.0–46.0)
Hemoglobin: 10 g/dL — ABNORMAL LOW (ref 12.0–15.0)
Immature Granulocytes: 1 %
Lymphocytes Relative: 33 %
Lymphs Abs: 2.1 10*3/uL (ref 0.7–4.0)
MCH: 28.2 pg (ref 26.0–34.0)
MCHC: 33 g/dL (ref 30.0–36.0)
MCV: 85.4 fL (ref 80.0–100.0)
Monocytes Absolute: 0.9 10*3/uL (ref 0.1–1.0)
Monocytes Relative: 13 %
Neutro Abs: 3.3 10*3/uL (ref 1.7–7.7)
Neutrophils Relative %: 50 %
Platelets: 224 10*3/uL (ref 150–400)
RBC: 3.55 MIL/uL — ABNORMAL LOW (ref 3.87–5.11)
RDW: 15 % (ref 11.5–15.5)
WBC: 6.6 10*3/uL (ref 4.0–10.5)
nRBC: 0 % (ref 0.0–0.2)

## 2020-04-30 LAB — BASIC METABOLIC PANEL
Anion gap: 10 (ref 5–15)
BUN: 19 mg/dL (ref 8–23)
CO2: 19 mmol/L — ABNORMAL LOW (ref 22–32)
Calcium: 7.9 mg/dL — ABNORMAL LOW (ref 8.9–10.3)
Chloride: 107 mmol/L (ref 98–111)
Creatinine, Ser: 1.45 mg/dL — ABNORMAL HIGH (ref 0.44–1.00)
GFR, Estimated: 38 mL/min — ABNORMAL LOW (ref >60)
Glucose, Bld: 173 mg/dL — ABNORMAL HIGH (ref 70–99)
Potassium: 4 mmol/L (ref 3.5–5.1)
Sodium: 136 mmol/L (ref 135–145)

## 2020-04-30 LAB — GLUCOSE, CAPILLARY
Glucose-Capillary: 184 mg/dL — ABNORMAL HIGH (ref 70–99)
Glucose-Capillary: 386 mg/dL — ABNORMAL HIGH (ref 70–99)
Glucose-Capillary: 268 mg/dL — ABNORMAL HIGH (ref 70–99)
Glucose-Capillary: 214 mg/dL — ABNORMAL HIGH (ref 70–99)

## 2020-04-30 MED ORDER — NEPRO/CARBSTEADY PO LIQD
237.0000 mL | Freq: Two times a day (BID) | ORAL | Status: DC
  Administered 2020-04-30 – 2020-05-09 (×14): 237 mL via ORAL

## 2020-04-30 MED ORDER — INSULIN GLARGINE 100 UNIT/ML ~~LOC~~ SOLN
10.0000 [IU] | Freq: Every day | SUBCUTANEOUS | Status: DC
  Administered 2020-05-01: 10 [IU] via SUBCUTANEOUS
  Filled 2020-04-30: qty 0.1

## 2020-04-30 NOTE — Evaluation (Addendum)
Occupational Therapy Evaluation Patient Details Name: Susan House MRN: [id number] DOB: June 18, 1956 Today's Date: 04/30/2020    History of Present Illness 64 y.o. female admitted on 04/27/2020 with bacteremia likely secondary to UTI. PMH for HTN, dyslipidemia, CVA and TIA. Patient was started on ceftriaxone and BCx 1/2 sets (both bottles) with GPC S. Aureus and epidermidis, methicillin resistance detected. Interestingly, culture has growing S. Aureus and E. Faecalis. Pharmacy has been consulted for vancomycin dosing.   Clinical Impression   Patient presenting with decreased I in self care, balance, functional transfer/mobility, endurance, and safety awareness. Pt is L hemi at baseline and husband present to confirm PLOF. He has been assisting her with self care tasks over the last few weeks and she has been transferring to seat of rollator and he is pushing her around the home in it. Pt was ambulating short distances at home prior to functional decline. Patient currently functioning at max A for sit <>stand and functional transfers with posterior bias. Pt needs set up A for grooming tasks, min A for UB self care, and mod -max A for LB self care.  Patient will benefit from acute OT to increase overall independence in the areas of ADLs, functional mobility, and safety awareness in order to safely discharge to next venue of care.    Follow Up Recommendations  SNF    Equipment Recommendations  None recommended by OT    Recommendations for Other Services Other (comment) (none at this time)     Precautions / Restrictions Precautions Precautions: Fall      Mobility Bed Mobility Overal bed mobility: Needs Assistance Bed Mobility: Rolling;Supine to Sit;Sit to Supine Rolling: Max assist   Supine to sit: Max assist Sit to supine: Mod assist   General bed mobility comments: Assist with B LEs and trunk support. Pt able to briefly grasp rail with L hand  Transfers Overall transfer  level: Needs assistance Equipment used: None Transfers: Sit to/from Stand Sit to Stand: Max assist         General transfer comment: max A x 2 reps from EOB with posterior lean    Balance Overall balance assessment: Needs assistance Sitting-balance support: Feet supported Sitting balance-Leahy Scale: Poor Sitting balance - Comments: min - mod A initially for static sitting balance     Standing balance-Leahy Scale: Zero Standing balance comment: Max A      ADL either performed or assessed with clinical judgement   ADL Overall ADL's : Needs assistance/impaired Eating/Feeding: Set up   Grooming: Wash/dry hands;Wash/dry face;Oral care;Set up;Sitting       Toilet Transfer: Maximal assistance;Squat-pivot         Vision Baseline Vision/History: Wears glasses Wears Glasses: At all times Patient Visual Report: No change from baseline              Pertinent Vitals/Pain Pain Assessment: No/denies pain     Hand Dominance Right   Extremity/Trunk Assessment Upper Extremity Assessment Upper Extremity Assessment: LUE deficits/detail;Generalized weakness LUE Deficits / Details: baseline L hemi from CVA ~ 20 years ago           Communication Communication Communication: Expressive difficulties   Cognition Arousal/Alertness: Awake/alert Behavior During Therapy: WFL for tasks assessed/performed Overall Cognitive Status: Within Functional Limits for tasks assessed                     Home Living Family/patient expects to be discharged to:: Private residence Living Arrangements: Spouse/significant other Available Help at Discharge: Family;Available  24 hours/day Type of Home: House Home Access: Ramped entrance     Home Layout: One level     Bathroom Shower/Tub: Occupational psychologist: Handicapped height Bathroom Accessibility: Yes How Accessible: Accessible via walker Home Equipment: Bedside commode;Walker - 2 wheels;Cane - single  point;Transport chair;Shower seat - built in;Other (comment)          Prior Functioning/Environment Level of Independence: Needs assistance  Gait / Transfers Assistance Needed: ambulatory with 2WW, husband pushes her into/out of the house up their ramp in the wheelchair ADL's / Homemaking Assistance Needed: Husband reports he has been providing min A lately but that pt was I prior to that. At baseline she is incontinent and uses briefs. Husband performs all IADL tasks   Comments: endorses recent falls        OT Problem List: Decreased strength;Decreased activity tolerance;Decreased safety awareness;Decreased knowledge of use of DME or AE;Impaired balance (sitting and/or standing);Decreased knowledge of precautions      OT Treatment/Interventions: Self-care/ADL training;Therapeutic exercise;Therapeutic activities;Energy conservation;Visual/perceptual remediation/compensation;Patient/family education;DME and/or AE instruction;Balance training    OT Goals(Current goals can be found in the care plan section) Acute Rehab OT Goals Patient Stated Goal: to go to rehab OT Goal Formulation: With patient Time For Goal Achievement: 05/14/20 Potential to Achieve Goals: Good ADL Goals Pt Will Perform Grooming: with modified independence;standing Pt Will Transfer to Toilet: with supervision;ambulating Pt Will Perform Toileting - Clothing Manipulation and hygiene: with supervision;sit to/from stand  OT Frequency: Min 1X/week   Barriers to D/C: Other (comment)  none at this time          AM-PAC OT "6 Clicks" Daily Activity     Outcome Measure Help from another person eating meals?: A Little Help from another person taking care of personal grooming?: A Little Help from another person toileting, which includes using toliet, bedpan, or urinal?: Total Help from another person bathing (including washing, rinsing, drying)?: Total Help from another person to put on and taking off regular upper  body clothing?: A Lot Help from another person to put on and taking off regular lower body clothing?: Total 6 Click Score: 11   End of Session Nurse Communication: Mobility status  Activity Tolerance: Patient tolerated treatment well Patient left: in bed;with call bell/phone within reach;with family/visitor present;with bed alarm set  OT Visit Diagnosis: Unsteadiness on feet (R26.81);Repeated falls (R29.6);Muscle weakness (generalized) (M62.81);History of falling (Z91.81)                Time: 0321-2248 OT Time Calculation (min): 20 min Charges:  OT General Charges $OT Visit: 1 Visit OT Treatments $Therapeutic Activity: 8-22 mins  Darleen Crocker, MS, OTR/L , CBIS ascom [phone]  04/30/20, 2:51 PM

## 2020-04-30 NOTE — Care Management Important Message (Signed)
Important Message  Patient Details  Name: Susan House MRN: [id number] Date of Birth: February 06, 1956   Medicare Important Message Given:  Yes     Dannette Barbara 04/30/2020, 2:48 PM

## 2020-04-30 NOTE — Progress Notes (Signed)
Pharmacy Antibiotic Note  Susan House is a 64 y.o. female admitted on 04/27/2020 with bacteremia likely secondary to UTI. PMH for HTN, dyslipidemia, CVA and TIA. Patient was started on ceftriaxone and BCx 1/2 sets (both bottles) with GPC S. Aureus and epidermidis, methicillin resistance detected. Interestingly, culture has growing S. Aureus and E. Faecalis. Pharmacy has been consulted for vancomycin dosing.   Today, 04/30/2020 Day #4 vancomycin  Scr 1.44>1.37>1.33>1.45  WBC 11.4>6.3>6.6  Afebrile  TTE results pending  Blood cx with Enterococcus faecalis and S. Aureus  Source no clear at this time  Trough 10/14 at 15:00 = 29 - supratherapeutic (prior to 5th total dose, incl loading dose)  Plan:  Vanc trough supratherapeutic, changed Vancomycin 750mg  IV q12h to Vancomycin 1000mg  IV q24h (starting 24 hours after last 750mg  dose)  Trough goal = 15-20 mcg/ml - will reassess Monday  Continue to monitor renal function  Await susceptibilities  Height: 5\' 3"  (160 cm) Weight: 96.2 kg (212 lb 1.6 oz) IBW/kg (Calculated) : 52.4  Temp (24hrs), Avg:98.2 F (36.8 C), Min:97.5 F (36.4 C), Max:99 F (37.2 C)  Recent Labs  Lab 04/27/20 0055 04/27/20 0343 04/27/20 0522 04/27/20 0610 04/27/20 0919 04/27/20 1821 04/28/20 0547 04/28/20 1253 04/29/20 0535 04/29/20 1134 04/29/20 1500 04/30/20 0457  WBC 11.4*  --   --   --   --   --   --   --   --  6.3  --  6.6  CREATININE 1.90*  --    < >  --  1.44*  --  1.39* 1.37* 1.33*  --   --  1.45*  LATICACIDVEN 2.1* 2.3*  --  4.7* 2.0* 0.8  --   --   --   --   --   --   VANCOTROUGH  --   --   --   --   --   --   --   --   --   --  29*  --    < > = values in this interval not displayed.    Estimated Creatinine Clearance: 43.3 mL/min (A) (by C-G formula based on SCr of 1.45 mg/dL (H)).    Allergies  Allergen Reactions  . Morphine And Related Anaphylaxis  . Reglan [Metoclopramide]     Other reaction(s): Unknown Elevated BP  .  Simvastatin Other (See Comments)    Muscle pain  . Betadine [Povidone Iodine] Rash  . Tetracyclines & Related Rash    Other reaction(s): Unknown    Antimicrobials this admission: 10/12 Ceftriaxone >> 10/13 10/12 Vancomycin >>   Microbiology results: 10/12 BCx: GPC 1/2 sets (both bottles) S. Aureus and epidermidis, methicillin resistance detected. Cx growing S. Aureus and E. faecalis 10/12 UCx: mx species  10/14 BCx: NGTD  Thank you for allowing pharmacy to be a part of this patient's care.  Lu Duffel, PharmD, BCPS Clinical Pharmacist 04/30/2020 7:32 AM

## 2020-04-30 NOTE — Progress Notes (Signed)
Lab tech from [facility] called at 2005 and reported a critical vacn trough of 29.  Prudy Feeler, MD then notified at 2008.  Pharmacy notified as well.  Vanc frequency changed from q12h to q24h.

## 2020-04-30 NOTE — Evaluation (Signed)
Physical Therapy Evaluation Patient Details Name: Susan House MRN: [id number] DOB: 09-14-1955 Today's Date: 04/30/2020   History of Present Illness  64 y.o. female admitted on 04/27/2020 with bacteremia likely secondary to UTI. PMH for HTN, dyslipidemia, CVA and TIA. Patient was started on ceftriaxone and BCx 1/2 sets (both bottles) with GPC S. Aureus and epidermidis, methicillin resistance detected. Interestingly, culture has growing S. Aureus and E. Faecalis. Pharmacy has been consulted for vancomycin dosing.  Clinical Impression  Patient received in bed, agreeable to get up to recliner. States "let's do it." She required mod assist for supine to sit. And min assist for initial sitting balance. She is able to stand with max assist initially due to posterior lean and was assisted back to sitting on side of bed. With second attempt, patient is able to stand with mod assist and then pivoted to recliner with min assist. She will continue to benefit from skilled PT while here to improve strength, functional independence for decreased caregiver burden.      Follow Up Recommendations SNF    Equipment Recommendations  None recommended by PT    Recommendations for Other Services       Precautions / Restrictions Precautions Precautions: Fall Restrictions Weight Bearing Restrictions: No      Mobility  Bed Mobility Overal bed mobility: Needs Assistance Bed Mobility: Supine to Sit Rolling: Max assist   Supine to sit: Mod assist Sit to supine: Mod assist   General bed mobility comments: Assist with B LEs and trunk support. Pt able to briefly grasp rail with L hand  Transfers Overall transfer level: Needs assistance Equipment used: Rolling walker (2 wheeled) Transfers: Sit to/from [facility] Sit to Stand: From elevated surface;Mod assist Stand pivot transfers: Mod assist       General transfer comment: max assist initially due to posterior lean and reported  dizziness. Patient returned to sititng. On second attempt required mod assist with improved standing balance.  Ambulation/Gait Ambulation/Gait assistance: Mod assist Gait Distance (Feet): 2 Feet Assistive device: Rolling walker (2 wheeled) Gait Pattern/deviations: Step-to pattern;Decreased stride length;Decreased step length - right;Decreased step length - left;Shuffle;Wide base of support Gait velocity: decreased   General Gait Details: patient requires assistance throughout transfer bed to recliner to her right side. She is able to take some short, shuffle steps.  Stairs            Wheelchair Mobility    Modified Rankin (Stroke Patients Only)       Balance Overall balance assessment: Needs assistance Sitting-balance support: Feet supported Sitting balance-Leahy Scale: Fair Sitting balance - Comments: min assist for initial sitting Postural control: Posterior lean   Standing balance-Leahy Scale: Poor Standing balance comment: Max A initially due to posterior lean, improved on second attempt to min assist with cues to push hips forward.                             Pertinent Vitals/Pain Pain Assessment: No/denies pain    Home Living Family/patient expects to be discharged to:: Skilled nursing facility Living Arrangements: Spouse/significant other Available Help at Discharge: Family;Available 24 hours/day Type of Home: House Home Access: Ramped entrance     Home Layout: One level Home Equipment: Bedside commode;Cane - single point;Transport chair;Shower seat - built in;Other (comment);Walker - 4 wheels      Prior Function Level of Independence: Needs assistance   Gait / Transfers Assistance Needed: ambulatory with 2WW, husband pushes  her into/out of the house up their ramp in the wheelchair  ADL's / Homemaking Assistance Needed: Husband reports he has been providing min A lately but that pt was I prior to that. At baseline she is incontinent and uses  briefs. Husband performs all IADL tasks  Comments: endorses recent falls     Hand Dominance   Dominant Hand: Right    Extremity/Trunk Assessment   Upper Extremity Assessment Upper Extremity Assessment: LUE deficits/detail LUE Deficits / Details: baseline L hemi from CVA ~ 20 years ago    Lower Extremity Assessment Lower Extremity Assessment: Generalized weakness    Cervical / Trunk Assessment Cervical / Trunk Assessment: Normal  Communication   Communication: Expressive difficulties  Cognition Arousal/Alertness: Awake/alert Behavior During Therapy: WFL for tasks assessed/performed Overall Cognitive Status: Within Functional Limits for tasks assessed                                        General Comments      Exercises     Assessment/Plan    PT Assessment Patient needs continued PT services  PT Problem List Decreased strength;Decreased mobility;Decreased activity tolerance;Decreased balance;Decreased safety awareness;Obesity       PT Treatment Interventions Therapeutic activities;Gait training;Therapeutic exercise;Patient/family education;Balance training;Functional mobility training    PT Goals (Current goals can be found in the Care Plan section)  Acute Rehab PT Goals Patient Stated Goal: to go to rehab PT Goal Formulation: With patient/family Time For Goal Achievement: 05/07/20 Potential to Achieve Goals: Good    Frequency Min 2X/week   Barriers to discharge Decreased caregiver support      Co-evaluation               AM-PAC PT "6 Clicks" Mobility  Outcome Measure Help needed turning from your back to your side while in a flat bed without using bedrails?: A Lot Help needed moving from lying on your back to sitting on the side of a flat bed without using bedrails?: A Lot Help needed moving to and from a bed to a chair (including a wheelchair)?: A Lot Help needed standing up from a chair using your arms (e.g., wheelchair or  bedside chair)?: A Lot Help needed to walk in hospital room?: Total Help needed climbing 3-5 steps with a railing? : Total 6 Click Score: 10    End of Session Equipment Utilized During Treatment: Gait belt Activity Tolerance: Patient limited by fatigue Patient left: in chair;with chair alarm set;with call bell/phone within reach;with family/visitor present Nurse Communication: Mobility status;Other (comment) (patient needs pure wick replaced) PT Visit Diagnosis: Muscle weakness (generalized) (M62.81);Difficulty in walking, not elsewhere classified (R26.2);History of falling (Z91.81);Unsteadiness on feet (R26.81)    Time: 1425-1445 PT Time Calculation (min) (ACUTE ONLY): 20 min   Charges:   PT Evaluation $PT Eval Moderate Complexity: 1 Mod PT Treatments $Therapeutic Activity: 8-22 mins        Maribel Hadley, PT, GCS 04/30/20,3:08 PM

## 2020-04-30 NOTE — Progress Notes (Addendum)
PROGRESS NOTE    Susan House  PYP:[id number] DOB: May 05, 1956 DOA: 04/27/2020 PCP: Jerrol Banana., MD   Susan House  is a 64 y.o. Caucasian female with a known history of multiple medical problems that are mentioned below including hypertension, dyslipidemia, CVA and TIA, who presented to the emergency room with acute onset of altered mental status.  She has been having decreased energy level and vomiting at home.  She is a very poor historian and has not been able to provide any meaningful history.  She was tachycardic and tachypneic upon arrival.  She was having extensive fecal material around her and several bruises.  No reported chest pain or palpitations.  No reported cough or wheezing or dyspnea.  It is unclear if she has been having urinary symptoms including dysuria or frequency but she answers yes to all questions.  Upon presentation to the emergency room blood pressure was 167/92 with a heart rate of 126 and respiratory to 26 with pulse symmetry of 98% on room air.  Labs revealed ABG with pH 7.37 and HCO3 of 12.7 and BMP revealed a CO2 of 14 with a blood glucose of 448 and a creatinine of 1.9 with anion gap of 23 magnesium of 1.3 and total bili of 2.1 with lactic acid of 2.1 later 2.3 and CBC showed leukocytosis 11.4 with relative neutrophilia.  Beta hydroxybutyrate was 7.11.  COVID-19 PCR and influenza antigens came back negative.  UA showed more than 50 WBCs more than 300 protein and 20 ketones and more than 500 glucose.  The patient was given 2 g of IV magnesium sulfate, total bolus of IV lactated Ringer followed by 125 mL/h; of IV potassium chloride.  She will be admitted to a stepdown unit for further evaluation and management.     Assessment & Plan:   Active Problems:   DKA (diabetic ketoacidosis) ([place])   DKA: secondary to poorly controlled DM2 and bloodstream infection. Resolved. Glucose today mid 100s. Off insulin gtt and IV fluids - increase lantus  rom 9 to 10, continue SSI  Bacteremia  Sepsis:  presented w/ tachycardia, tachypnea & bacteremia & possible UTI. Has spinal hardware, no tenderness at site. Hemodynamically stable currently. Blood cultures growing MRSA - appreciate ID recs, repeat blood cultures ordered - continue vancomycin - TTE neg for vegetations, ID advising TEE, will contact cardiology  Hypokalemia - k normal today - monitor  Encephalopathy: likely metabolic & secondary to above. Patient has improved. CT head neg for acute intracranial findings, but atrophy & chronic infarcts present.  - slp consulted - dysphagia 2 diet, thin liquids  AKI: likely prerenal from dehydration. Resolved, kidney function today is at baseline of cr ~1.5  HTN: elevated today, on home meds, with addition of hctz 25 yesterday - continue home amlodipine, metoprolol, hydralazine, enalapril - continue new hctz 25 - will need outpt eval for resistant htn  Carotid stenosis - cont home plavix, aspirin  History CVA, possible seizure disorder - with spastic hemiplegia - cont home keppra  HLD: continue on statin   GERD: continue on PPI    DVT prophylaxis: lovenox  Code Status: full  Family Communication: husband updated 10/13, attempted to update telephonically 10/15 but no answer Disposition Plan: depends on PT/OT (not consulted yet)    Consultants:   ID   Procedures:   Antimicrobials:  - ceftriaxone 10/12 - vancomycin 10/12 >  Subjective: Pt pleasant, no complaints. Has appetite, no fevers, no pain.  Objective: Vitals:  04/30/20 0404 04/30/20 0406 04/30/20 0416 04/30/20 0801  BP: (!) 193/76  (!) 165/71 (!) 169/73  Pulse: 75  72 70  Resp: 19  18 18   Temp: 98.5 F (36.9 C)  99 F (37.2 C) 98.7 F (37.1 C)  TempSrc: Oral  Oral Oral  SpO2: 97%  96% 96%  Weight:  96.2 kg    Height:        Intake/Output Summary (Last 24 hours) at 04/30/2020 1134 Last data filed at 04/30/2020 0950 Gross per 24 hour  Intake 1740  ml  Output 1450 ml  Net 290 ml   Filed Weights   04/28/20 0519 04/29/20 0334 04/30/20 0406  Weight: 93.1 kg 95.5 kg 96.2 kg    Examination:  General exam: Alert, pleasant. Morbidly obese Respiratory system: diminished breath sounds b/l  Cardiovascular system: S1 & S2 +. No ubs, gallops or clicks. Trace pedal edema Gastrointestinal system: Abdomen is obese, soft and nontender. Hypoactive bowel sounds Central nervous system: Awake, alert, moving all 4 extremities. Can answer simple questions Psychiatry: Judgement and insight appear abnormal.      Data Reviewed: I have personally reviewed following labs and imaging studies  CBC: Recent Labs  Lab 04/27/20 0055 04/29/20 1134 04/30/20 0457  WBC 11.4* 6.3 6.6  NEUTROABS 10.4* 4.2 3.3  HGB 11.6* 9.7* 10.0*  HCT 35.1* 30.0* 30.3*  MCV 85.8 86.2 85.4  PLT 265 242 465   Basic Metabolic Panel: Recent Labs  Lab 04/27/20 0055 04/27/20 0055 04/27/20 0522 04/27/20 0522 04/27/20 0919 04/28/20 0547 04/28/20 1253 04/29/20 0535 04/30/20 0457  NA 142   < > 143  --  140  --  143 140 136  K 3.6   < > 3.5  --  3.0*  --  3.1* 4.6 4.0  CL 105   < > 109  --  108  --  107 111 107  CO2 14*   < > 15*  --  22  --  21* 20* 19*  GLUCOSE 448*   < > 182*  --  172*  --  195* 169* 173*  BUN 22   < > 20  --  17  --  15 15 19   CREATININE 1.90*   < > 1.71*   < > 1.44* 1.39* 1.37* 1.33* 1.45*  CALCIUM 8.6*   < > 8.1*  --  8.0*  --  8.2* 7.9* 7.9*  MG 1.3*  --  1.4*  --   --   --  1.4* 1.9  --    < > = values in this interval not displayed.   GFR: Estimated Creatinine Clearance: 43.3 mL/min (A) (by C-G formula based on SCr of 1.45 mg/dL (H)). Liver Function Tests: Recent Labs  Lab 04/27/20 0055 04/28/20 1253  AST 24 27  ALT 14 13  ALKPHOS 67 55  BILITOT 2.1* 1.0  PROT 7.4 6.0*  ALBUMIN 3.5 2.9*   No results for input(s): LIPASE, AMYLASE in the last 168 hours. No results for input(s): AMMONIA in the last 168 hours. Coagulation  Profile: Recent Labs  Lab 04/27/20 0055  INR 1.0   Cardiac Enzymes: No results for input(s): CKTOTAL, CKMB, CKMBINDEX, TROPONINI in the last 168 hours. BNP (last 3 results) No results for input(s): PROBNP in the last 8760 hours. HbA1C: No results for input(s): HGBA1C in the last 72 hours. CBG: Recent Labs  Lab 04/29/20 0921 04/29/20 1326 04/29/20 1713 04/29/20 1959 04/30/20 0759  GLUCAP 160* 314* 165* 224* 184*  Lipid Profile: No results for input(s): CHOL, HDL, LDLCALC, TRIG, CHOLHDL, LDLDIRECT in the last 72 hours. Thyroid Function Tests: No results for input(s): TSH, T4TOTAL, FREET4, T3FREE, THYROIDAB in the last 72 hours. Anemia Panel: No results for input(s): VITAMINB12, FOLATE, FERRITIN, TIBC, IRON, RETICCTPCT in the last 72 hours. Sepsis Labs: Recent Labs  Lab 04/27/20 0343 04/27/20 0610 04/27/20 0919 04/27/20 1821  LATICACIDVEN 2.3* 4.7* 2.0* 0.8    Recent Results (from the past 240 hour(s))  Blood Culture (routine x 2)     Status: Abnormal (Preliminary result)   Collection Time: 04/27/20 12:55 AM   Specimen: BLOOD  Result Value Ref Range Status   Specimen Description   Final    BLOOD LEFT [facility] Performed at [facility], [address], [place], [place] [address]    Special Requests   Final    BOTTLES DRAWN AEROBIC AND ANAEROBIC Blood Culture results may not be optimal due to an excessive volume of blood received in culture bottles Performed at [facility], [address], [place], [place] [address]    Culture  Setup Time   Final    GRAM POSITIVE COCCI IN BOTH AEROBIC AND ANAEROBIC BOTTLES CRITICAL RESULT CALLED TO, READ BACK BY AND VERIFIED WITH: SUSAN WATSON AT 4496 ON 04/27/2020 [place].    Culture (A)  Final    STAPHYLOCOCCUS AUREUS ENTEROCOCCUS FAECALIS STAPHYLOCOCCUS SIMULANS CULTURE REINCUBATED FOR BETTER GROWTH Performed at [facility], [place] [address] [place], [place] [address]    Report Status PENDING  Incomplete    Organism ID, Bacteria STAPHYLOCOCCUS AUREUS  Final      Susceptibility   Staphylococcus aureus - MIC*    CIPROFLOXACIN >=8 RESISTANT Resistant     ERYTHROMYCIN >=8 RESISTANT Resistant     GENTAMICIN <=0.5 SENSITIVE Sensitive     OXACILLIN <=0.25 SENSITIVE Sensitive     TETRACYCLINE <=1 SENSITIVE Sensitive     VANCOMYCIN <=0.5 SENSITIVE Sensitive     TRIMETH/SULFA <=10 SENSITIVE Sensitive     CLINDAMYCIN RESISTANT Resistant     RIFAMPIN <=0.5 SENSITIVE Sensitive     Inducible Clindamycin POSITIVE Resistant     * STAPHYLOCOCCUS AUREUS  Blood Culture (routine x 2)     Status: None (Preliminary result)   Collection Time: 04/27/20 12:55 AM   Specimen: BLOOD  Result Value Ref Range Status   Specimen Description BLOOD RIGTH HAND  Final   Special Requests   Final    BOTTLES DRAWN AEROBIC AND ANAEROBIC Blood Culture adequate volume   Culture   Final    NO GROWTH 3 DAYS Performed at [facility], [address], [place],  [address]    Report Status PENDING  Incomplete  Respiratory Panel by RT PCR (Flu A&B, Covid) - Nasopharyngeal Swab     Status: None   Collection Time: 04/27/20 12:55 AM   Specimen: Nasopharyngeal Swab  Result Value Ref Range Status   SARS Coronavirus 2 by RT PCR NEGATIVE NEGATIVE Final    Comment: (NOTE) SARS-CoV-2 target nucleic acids are NOT DETECTED.  The SARS-CoV-2 RNA is generally detectable in upper respiratoy specimens during the acute phase of infection. The lowest concentration of SARS-CoV-2 viral copies this assay can detect is 131 copies/mL. A negative result does not preclude SARS-Cov-2 infection and should not be used as the sole basis for treatment or other patient management decisions. A negative result may occur with  improper specimen collection/handling, submission of specimen other than nasopharyngeal swab, presence of viral mutation(s) within the  areas targeted by this assay, and inadequate number of viral copies (<131  copies/mL). A negative result must be combined with clinical observations, patient history, and epidemiological information. The expected result is Negative.  Fact Sheet for Patients:  PinkCheek.be  Fact Sheet for Healthcare Providers:  GravelBags.it  This test is no t yet approved or cleared by the [place] FDA and  has been authorized for detection and/or diagnosis of SARS-CoV-2 by FDA under an Emergency Use Authorization (EUA). This EUA will remain  in effect (meaning this test can be used) for the duration of the COVID-19 declaration under Section 564(b)(1) of the Act, 21 U.S.C. section 360bbb-3(b)(1), unless the authorization is terminated or revoked sooner.     Influenza A by PCR NEGATIVE NEGATIVE Final   Influenza B by PCR NEGATIVE NEGATIVE Final    Comment: (NOTE) The Xpert Xpress SARS-CoV-2/FLU/RSV assay is intended as an aid in  the diagnosis of influenza from Nasopharyngeal swab specimens and  should not be used as a sole basis for treatment. Nasal washings and  aspirates are unacceptable for Xpert Xpress SARS-CoV-2/FLU/RSV  testing.  Fact Sheet for Patients: PinkCheek.be  Fact Sheet for Healthcare Providers: GravelBags.it  This test is not yet approved or cleared by the [place] FDA and  has been authorized for detection and/or diagnosis of SARS-CoV-2 by  FDA under an Emergency Use Authorization (EUA). This EUA will remain  in effect (meaning this test can be used) for the duration of the  Covid-19 declaration under Section 564(b)(1) of the Act, 21  U.S.C. section 360bbb-3(b)(1), unless the authorization is  terminated or revoked. Performed at [facility], [address] [place], [place] [address]   Blood Culture ID Panel (Reflexed)     Status: Abnormal   Collection Time: 04/27/20 12:55 AM  Result Value Ref Range Status    Enterococcus faecalis NOT DETECTED NOT DETECTED Final   Enterococcus Faecium NOT DETECTED NOT DETECTED Final   Listeria monocytogenes NOT DETECTED NOT DETECTED Final   Staphylococcus species DETECTED (A) NOT DETECTED Final    Comment: CRITICAL RESULT CALLED TO, READ BACK BY AND VERIFIED WITH: SUSAN WATSON AT 1424 ON 04/27/2020 [place].    Staphylococcus aureus (BCID) DETECTED (A) NOT DETECTED Final    Comment: CRITICAL RESULT CALLED TO, READ BACK BY AND VERIFIED WITH: SUSAN WATSON AT 1424 ON 04/27/2020 [place]    Staphylococcus epidermidis DETECTED (A) NOT DETECTED Final    Comment: CRITICAL RESULT CALLED TO, READ BACK BY AND VERIFIED WITH: SUSAN WATSON AT 1424 ON 04/27/2020 [place].    Staphylococcus lugdunensis NOT DETECTED NOT DETECTED Final   Streptococcus species NOT DETECTED NOT DETECTED Final   Streptococcus agalactiae NOT DETECTED NOT DETECTED Final   Streptococcus pneumoniae NOT DETECTED NOT DETECTED Final   Streptococcus pyogenes NOT DETECTED NOT DETECTED Final   A.calcoaceticus-baumannii NOT DETECTED NOT DETECTED Final   Bacteroides fragilis NOT DETECTED NOT DETECTED Final   Enterobacterales NOT DETECTED NOT DETECTED Final   Enterobacter cloacae complex NOT DETECTED NOT DETECTED Final   Escherichia coli NOT DETECTED NOT DETECTED Final   Klebsiella aerogenes NOT DETECTED NOT DETECTED Final   Klebsiella oxytoca NOT DETECTED NOT DETECTED Final   Klebsiella pneumoniae NOT DETECTED NOT DETECTED Final   Proteus species NOT DETECTED NOT DETECTED Final   Salmonella species NOT DETECTED NOT DETECTED Final   Serratia marcescens NOT DETECTED NOT DETECTED Final   Haemophilus influenzae NOT DETECTED NOT DETECTED Final   Neisseria meningitidis NOT DETECTED NOT DETECTED Final  Pseudomonas aeruginosa NOT DETECTED NOT DETECTED Final   Stenotrophomonas maltophilia NOT DETECTED NOT DETECTED Final   Candida albicans NOT DETECTED NOT DETECTED Final   Candida auris NOT DETECTED NOT DETECTED Final    Candida glabrata NOT DETECTED NOT DETECTED Final   Candida krusei NOT DETECTED NOT DETECTED Final   Candida parapsilosis NOT DETECTED NOT DETECTED Final   Candida tropicalis NOT DETECTED NOT DETECTED Final   Cryptococcus neoformans/gattii NOT DETECTED NOT DETECTED Final   Methicillin resistance mecA/C DETECTED (A) NOT DETECTED Final    Comment: CRITICAL RESULT CALLED TO, READ BACK BY AND VERIFIED WITH: SUSAN WATSON AT 1424 ON 04/27/2020 [place].    Meth resistant mecA/C and MREJ NOT DETECTED NOT DETECTED Final    Comment: Performed at [facility], [address] [place], [place] [address]  Urine culture     Status: Abnormal   Collection Time: 04/27/20  1:57 AM   Specimen: In/Out Cath Urine  Result Value Ref Range Status   Specimen Description   Final    IN/OUT CATH URINE Performed at [facility], [address], [place], [place] [address]    Special Requests   Final    NONE Performed at [facility], [place] [place], [place] [address]    Culture MULTIPLE SPECIES PRESENT, SUGGEST RECOLLECTION (A)  Final   Report Status 04/27/2020 FINAL  Final  CULTURE, BLOOD (ROUTINE X 2) w Reflex to ID Panel     Status: None (Preliminary result)   Collection Time: 04/29/20  7:05 PM   Specimen: BLOOD  Result Value Ref Range Status   Specimen Description BLOOD BLOOD RIGHT HAND  Final   Special Requests   Final    BOTTLES DRAWN AEROBIC AND ANAEROBIC Blood Culture adequate volume   Culture   Final    NO GROWTH < 12 HOURS Performed at [facility], [address], [place], [place] [address]    Report Status PENDING  Incomplete  CULTURE, BLOOD (ROUTINE X 2) w Reflex to ID Panel     Status: None (Preliminary result)   Collection Time: 04/29/20  7:06 PM   Specimen: BLOOD  Result Value Ref Range Status   Specimen Description BLOOD RIGHT ANTECUBITAL  Final   Special Requests   Final    BOTTLES DRAWN AEROBIC AND ANAEROBIC Blood Culture results may not be  optimal due to an excessive volume of blood received in culture bottles   Culture   Final    NO GROWTH < 12 HOURS Performed at [facility], [address], [place], [place] [address]    Report Status PENDING  Incomplete         Radiology Studies: No results found.      Scheduled Meds: . amLODipine  10 mg Oral Daily  . aspirin  81 mg Oral Daily  . atorvastatin  20 mg Oral q1800  . clopidogrel  75 mg Oral Daily  . enalapril  20 mg Oral BID  . enoxaparin (LOVENOX) injection  0.5 mg/kg Subcutaneous Q24H  . feeding supplement  237 mL Oral BID BM  . gabapentin  200 mg Oral BID  . hydrALAZINE  100 mg Oral BID  . hydrochlorothiazide  25 mg Oral Daily  . insulin aspart  0-9 Units Subcutaneous TID WC  . insulin glargine  9 Units Subcutaneous Daily  . levETIRAcetam  500 mg Oral BID  . metoprolol succinate  100 mg Oral Daily  . multivitamin with minerals  1 tablet Oral  Daily   Continuous Infusions: . vancomycin       LOS: 3 days    Time spent: 25 mins     Desma Maxim, MD [facility]  If 7PM-7AM, please contact night-coverage [email or web address] 04/30/2020, 11:34 AM

## 2020-04-30 NOTE — Progress Notes (Addendum)
ID Fever resolved. WBC down to 6. No active complaints.  Patient Vitals for the past 24 hrs:  BP Temp Temp src Pulse Resp SpO2 Weight  04/30/20 1229 (!) 159/62 99.1 F (37.3 C) Oral 77 -- 97 % --  04/30/20 0801 (!) 169/73 98.7 F (37.1 C) Oral 70 18 96 % --  04/30/20 0416 (!) 165/71 99 F (37.2 C) Oral 72 18 96 % --  04/30/20 0406 -- -- -- -- -- -- 96.2 kg  04/30/20 0404 (!) 193/76 98.5 F (36.9 C) Oral 75 19 97 % --  Awake and alert- oriented in person, place , time cheast B/L air entry [id number] abd soft Sacrum no decub bacK linear bruise   Left hemiparesis Left hand contractures No skin lesions abd soft  labs   CBC Latest Ref Rng & Units 04/30/2020 04/29/2020 04/27/2020  WBC 4.0 - 10.5 K/uL 6.6 6.3 11.4(H)  Hemoglobin 12.0 - 15.0 g/dL 10.0(L) 9.7(L) 11.6(L)  Hematocrit 36 - 46 % 30.3(L) 30.0(L) 35.1(L)  Platelets 150 - 400 K/uL 224 242 265    CMP Latest Ref Rng & Units 04/30/2020 04/29/2020 04/28/2020  Glucose 70 - 99 mg/dL 173(H) 169(H) 195(H)  BUN 8 - 23 mg/dL 19 15 15   Creatinine 0.44 - 1.00 mg/dL 1.45(H) 1.33(H) 1.37(H)  Sodium 135 - 145 mmol/L 136 140 143  Potassium 3.5 - 5.1 mmol/L 4.0 4.6 3.1(L)  Chloride 98 - 111 mmol/L 107 111 107  CO2 22 - 32 mmol/L 19(L) 20(L) 21(L)  Calcium 8.9 - 10.3 mg/dL 7.9(L) 7.9(L) 8.2(L)  Total Protein 6.5 - 8.1 g/dL - - 6.0(L)  Total Bilirubin 0.3 - 1.2 mg/dL - - 1.0  Alkaline Phos 38 - 126 U/L - - 55  AST 15 - 41 U/L - - 27  ALT 0 - 44 U/L - - 13  Ct head  Generalized cerebral atrophy. Redemonstrated large chronic infarct affecting the left basal ganglia and radiating white matter tracts, as well as ventral left thalamus. Associated ex vacuo dilatation of the left lateral ventricle.  Redemonstrated chronic lacunar infarct within the right thalamocapsular junction.  Known small chronic cortically based infarcts within the posterior right frontal lobe and anterolateral left frontal lobe were better appreciated on the  brain MRI of 12/23/2019.  Blood culture-left AC- MRSA, staph simulans and enterococcus  Rt had BC- NG  Impression/recommendation  MRSA -along with staph simulans and enterococcus in blood culture from the left cubital fossa -- source currently not clear. Does have possible UTI and also skin breakdown in groin and sacral area which could serve as port of entry. Did have fever and wbc on admit.  This could very well be contaminant ( but  MRSA cannot be considered a contaminant)   - she has repeat another blood culture She has fusion hardware in her spine but site okay  2 d echo okay .will need  TEE to decide on duration of antibiotic Would continue vanco for now pending fu bcx and TEE  UTI is questioned as  urine analysis > 50 WBC  post void bladder scan- no urine  CVA- many  old infarcts Also has left hemiparesis and expressive and receptive dysphasia

## 2020-05-01 DIAGNOSIS — R7881 Bacteremia: Secondary | ICD-10-CM | POA: Diagnosis not present

## 2020-05-01 DIAGNOSIS — B9562 Methicillin resistant Staphylococcus aureus infection as the cause of diseases classified elsewhere: Secondary | ICD-10-CM | POA: Diagnosis not present

## 2020-05-01 LAB — CBC WITH DIFFERENTIAL/PLATELET
Abs Immature Granulocytes: 0.11 10*3/uL — ABNORMAL HIGH (ref 0.00–0.07)
Basophils Absolute: 0 10*3/uL (ref 0.0–0.1)
Basophils Relative: 1 %
Eosinophils Absolute: 0.2 10*3/uL (ref 0.0–0.5)
Eosinophils Relative: 3 %
HCT: 28.3 % — ABNORMAL LOW (ref 36.0–46.0)
Hemoglobin: 9.3 g/dL — ABNORMAL LOW (ref 12.0–15.0)
Immature Granulocytes: 2 %
Lymphocytes Relative: 31 %
Lymphs Abs: 2.1 10*3/uL (ref 0.7–4.0)
MCH: 27.7 pg (ref 26.0–34.0)
MCHC: 32.9 g/dL (ref 30.0–36.0)
MCV: 84.2 fL (ref 80.0–100.0)
Monocytes Absolute: 1 10*3/uL (ref 0.1–1.0)
Monocytes Relative: 15 %
Neutro Abs: 3.3 10*3/uL (ref 1.7–7.7)
Neutrophils Relative %: 48 %
Platelets: 203 10*3/uL (ref 150–400)
RBC: 3.36 MIL/uL — ABNORMAL LOW (ref 3.87–5.11)
RDW: 14.9 % (ref 11.5–15.5)
WBC: 6.9 10*3/uL (ref 4.0–10.5)
nRBC: 0 % (ref 0.0–0.2)

## 2020-05-01 LAB — BASIC METABOLIC PANEL
Anion gap: 9 (ref 5–15)
BUN: 25 mg/dL — ABNORMAL HIGH (ref 8–23)
CO2: 21 mmol/L — ABNORMAL LOW (ref 22–32)
Calcium: 8.2 mg/dL — ABNORMAL LOW (ref 8.9–10.3)
Chloride: 103 mmol/L (ref 98–111)
Creatinine, Ser: 1.65 mg/dL — ABNORMAL HIGH (ref 0.44–1.00)
GFR, Estimated: 32 mL/min — ABNORMAL LOW (ref >60)
Glucose, Bld: 228 mg/dL — ABNORMAL HIGH (ref 70–99)
Potassium: 4 mmol/L (ref 3.5–5.1)
Sodium: 133 mmol/L — ABNORMAL LOW (ref 135–145)

## 2020-05-01 LAB — GLUCOSE, CAPILLARY
Glucose-Capillary: 223 mg/dL — ABNORMAL HIGH (ref 70–99)
Glucose-Capillary: 257 mg/dL — ABNORMAL HIGH (ref 70–99)
Glucose-Capillary: 364 mg/dL — ABNORMAL HIGH (ref 70–99)
Glucose-Capillary: 249 mg/dL — ABNORMAL HIGH (ref 70–99)

## 2020-05-01 LAB — VANCOMYCIN, TROUGH: Vancomycin Tr: 28 ug/mL (ref 15–20)

## 2020-05-01 MED ORDER — VANCOMYCIN VARIABLE DOSE PER UNSTABLE RENAL FUNCTION (PHARMACIST DOSING): Status: DC

## 2020-05-01 MED ORDER — INSULIN GLARGINE 100 UNIT/ML ~~LOC~~ SOLN
13.0000 [IU] | Freq: Every day | SUBCUTANEOUS | Status: DC
  Administered 2020-05-02: 13 [IU] via SUBCUTANEOUS
  Filled 2020-05-01: qty 0.13

## 2020-05-01 NOTE — Progress Notes (Signed)
Mobility Specialist - Progress Note   05/01/20 1500  Mobility  Activity Transferred:  Bed to chair  Level of Assistance Maximum assist, patient does 25-49%  Assistive Device  (Railing and CGA)  Distance Ambulated (ft) 2 ft  Mobility Response Tolerated well  Mobility performed by Mobility specialist  $Mobility charge 1 Mobility    Pre-mobility: 81 HR, 94% SpO2 Post-mobility: 106 HR, 98% SpO2    Pt was lying in bed upon arrival with husband present in room. Pt agreed to session. Pt denied any pain, nausea, or fatigue. Pt was able to get EOB with modA. Recliner was repositioned to pt's R side for easy transfer as pt has limitations on L side of body. Pt was able to stand after several unsuccessful attempts, using rails and CGA (maxA). Pt's husband also assisted with transfer. Pt pivoted 2' to recliner where she was left with all needs in reach. Nurse was notified.    Kathee Delton Mobility Specialist 05/01/20, 3:41 PM

## 2020-05-01 NOTE — Progress Notes (Addendum)
Pharmacy Antibiotic Note  Susan House is a 64 y.o. female admitted on 04/27/2020 with bacteremia likely secondary to UTI. PMH for HTN, dyslipidemia, CVA and TIA. Patient was started on ceftriaxone and BCx 1/2 sets (both bottles) with GPC S. Aureus and epidermidis, methicillin resistance detected. Interestingly, culture has growing S. Aureus and E. Faecalis. Pharmacy has been consulted for vancomycin dosing.   Scr yesterday 1.45 and Scr today 1.65. Pharmacist covering the patient in the morning reported she had Vancomycin dose held d/t Scr increase and therefore suspected the trough could be high. Awaiting lab results from trough drawn @ 1437 today.   Today, 05/01/2020 Day #5 vancomycin (last dose 10/15 @ 1624)  Scr 1.44>1.37>1.33>1.45 > 1.65  WBC 11.4>6.3>6.6> 6.9  Afebrile  TTE results pending  Blood cx with Enterococcus faecalis and S. Aureus  Source no clear at this time  Trough 10/16 @ 1437 =  28 - Supratherapeutic    Plan:  Vanc trough supratherapeutic, HOLD vancomycin and recheck vanc trough tomorrow at 1600. Will check Scr with AM labs.  Trough goal = 15-20 mcg/ml - will reassess Monday  Continue to monitor renal function  Await susceptibilities  Height: 5\' 3"  (160 cm) Weight: 99.2 kg (218 lb 11.1 oz) IBW/kg (Calculated) : 52.4  Temp (24hrs), Avg:98.4 F (36.9 C), Min:98 F (36.7 C), Max:98.7 F (37.1 C)  Recent Labs  Lab 04/27/20 0055 04/27/20 0343 04/27/20 0522 04/27/20 0610 04/27/20 0919 04/27/20 0919 04/27/20 1821 04/28/20 0547 04/28/20 1253 04/29/20 0535 04/29/20 1134 04/29/20 1500 04/30/20 0457 05/01/20 0534  WBC 11.4*  --   --   --   --   --   --   --   --   --  6.3  --  6.6 6.9  CREATININE 1.90*  --    < >  --  1.44*   < >  --  1.39* 1.37* 1.33*  --   --  1.45* 1.65*  LATICACIDVEN 2.1* 2.3*  --  4.7* 2.0*  --  0.8  --   --   --   --   --   --   --   VANCOTROUGH  --   --   --   --   --   --   --   --   --   --   --  29*  --   --     < > = values in this interval not displayed.    Estimated Creatinine Clearance: 38.7 mL/min (A) (by C-G formula based on SCr of 1.65 mg/dL (H)).    Allergies  Allergen Reactions  . Morphine And Related Anaphylaxis  . Reglan [Metoclopramide]     Other reaction(s): Unknown Elevated BP  . Simvastatin Other (See Comments)    Muscle pain  . Betadine [Povidone Iodine] Rash  . Tetracyclines & Related Rash    Other reaction(s): Unknown    Antimicrobials this admission: 10/12 Ceftriaxone >> 10/13 10/12 Vancomycin >>   Microbiology results: 10/12 BCx: GPC 1/2 sets (both bottles) S. Aureus and epidermidis, methicillin resistance detected. Cx growing S. Aureus and E. faecalis 10/12 UCx: mx species  10/14 BCx: NGTD  Thank you for allowing pharmacy to be a part of this patient's care.  Rowland Lathe, PharmD Clinical Pharmacist 05/01/2020 6:02 PM

## 2020-05-01 NOTE — Progress Notes (Signed)
Awaiting vancomycin trough levels result prior to giving Vancomycin IV

## 2020-05-01 NOTE — Progress Notes (Signed)
PROGRESS NOTE    Susan House  HUT:[id number] DOB: May 29, 1956 DOA: 04/27/2020 PCP: Jerrol Banana., MD   Susan House  is a 64 y.o. Caucasian female with a known history of multiple medical problems that are mentioned below including hypertension, dyslipidemia, CVA and TIA, who presented to the emergency room with acute onset of altered mental status.  She has been having decreased energy level and vomiting at home.  She is a very poor historian and has not been able to provide any meaningful history.  She was tachycardic and tachypneic upon arrival.  She was having extensive fecal material around her and several bruises.  No reported chest pain or palpitations.  No reported cough or wheezing or dyspnea.  It is unclear if she has been having urinary symptoms including dysuria or frequency but she answers yes to all questions.  Upon presentation to the emergency room blood pressure was 167/92 with a heart rate of 126 and respiratory to 26 with pulse symmetry of 98% on room air.  Labs revealed ABG with pH 7.37 and HCO3 of 12.7 and BMP revealed a CO2 of 14 with a blood glucose of 448 and a creatinine of 1.9 with anion gap of 23 magnesium of 1.3 and total bili of 2.1 with lactic acid of 2.1 later 2.3 and CBC showed leukocytosis 11.4 with relative neutrophilia.  Beta hydroxybutyrate was 7.11.  COVID-19 PCR and influenza antigens came back negative.  UA showed more than 50 WBCs more than 300 protein and 20 ketones and more than 500 glucose.  The patient was given 2 g of IV magnesium sulfate, total bolus of IV lactated Ringer followed by 125 mL/h; of IV potassium chloride.  She will be admitted to a stepdown unit for further evaluation and management.     Assessment & Plan:   Active Problems:   DKA (diabetic ketoacidosis) ([place])   Pressure injury of skin   DKA: secondary to poorly controlled DM2 and bloodstream infection. Resolved. Glucose today mid 100s. Off insulin gtt and IV  fluids - increase lantus from 10 to 13, continue SSI  Bacteremia  Sepsis:  presented w/ tachycardia, tachypnea & bacteremia & possible UTI. Has spinal hardware, no tenderness at site. Hemodynamically stable currently. Blood cultures growing MRSA, second set obtained 10/14 ngtd. - appreciate ID recs - continue vancomycin - TTE neg for vegetations, ID advising TEE, have contacted [facility] cardiology, plan for TEE next week. Timing TBD - discussed w/ on call ID today, thinks best wait for cultures to be neg 72 hours before placing picc, so will plan on ordering that tomorrow assuming repeat cultures remain neg - plan on d/c to SNF, have message care mgmt to start working on that  Hypokalemia - k normal today - monitor  Normocytic anemia - baseline hgb 11s, here has been trending 9-10. No melena or hematochezia or other bleeding - monitor  Encephalopathy: likely metabolic & secondary to above. Patient has improved. CT head neg for acute intracranial findings, but atrophy & chronic infarcts present.  - slp consulted - dysphagia 2 diet, thin liquids  AKI: likely prerenal from dehydration. Resolved. Slight bump in cr today to 1.65, will watch closely as did start diuretic on 10/14  HTN: elevated today, on home meds, with addition of hctz 25 10/14 - continue home amlodipine, metoprolol, hydralazine, enalapril - continue new hctz 25 - will need outpt eval for resistant htn  Carotid stenosis - cont home plavix, aspirin  History CVA, possible  seizure disorder - with spastic hemiplegia - cont home keppra  HLD: continue on statin   GERD: continue on PPI    DVT prophylaxis: lovenox  Code Status: full  Family Communication: husband updated today at bedside Disposition Plan: SNF (pt and ot have evaluated)   Consultants:   ID   Procedures:   Antimicrobials:  - ceftriaxone 10/12 - vancomycin 10/12 >  Subjective: Pt pleasant, no complaints. Has appetite, no fevers, no pain.    Objective: Vitals:   05/01/20 0421 05/01/20 0426 05/01/20 0744 05/01/20 1217  BP: (!) 173/76  (!) 174/68 (!) 131/92  Pulse: 77  76 75  Resp: 18  16 19   Temp: 98.7 F (37.1 C)  98.2 F (36.8 C) 98.4 F (36.9 C)  TempSrc:   Oral   SpO2: 97%  99% 99%  Weight:  99.2 kg    Height:        Intake/Output Summary (Last 24 hours) at 05/01/2020 1225 Last data filed at 05/01/2020 0943 Gross per 24 hour  Intake 1260 ml  Output 600 ml  Net 660 ml   Filed Weights   04/29/20 0334 04/30/20 0406 05/01/20 0426  Weight: 95.5 kg 96.2 kg 99.2 kg    Examination:  General exam: Alert, pleasant. Morbidly obese Respiratory system: diminished breath sounds b/l  Cardiovascular system: S1 & S2 +. No ubs, gallops or clicks. Trace pedal edema Gastrointestinal system: Abdomen is obese, soft and nontender. Hypoactive bowel sounds Central nervous system: Awake, alert, moving all 4 extremities.  Psychiatry: easily confused    Data Reviewed: I have personally reviewed following labs and imaging studies  CBC: Recent Labs  Lab 04/27/20 0055 04/29/20 1134 04/30/20 0457 05/01/20 0534  WBC 11.4* 6.3 6.6 6.9  NEUTROABS 10.4* 4.2 3.3 3.3  HGB 11.6* 9.7* 10.0* 9.3*  HCT 35.1* 30.0* 30.3* 28.3*  MCV 85.8 86.2 85.4 84.2  PLT 265 242 224 174   Basic Metabolic Panel: Recent Labs  Lab 04/27/20 0055 04/27/20 0055 04/27/20 0522 04/27/20 0522 04/27/20 0919 04/27/20 0919 04/28/20 0547 04/28/20 1253 04/29/20 0535 04/30/20 0457 05/01/20 0534  NA 142   < > 143   < > 140  --   --  143 140 136 133*  K 3.6   < > 3.5   < > 3.0*  --   --  3.1* 4.6 4.0 4.0  CL 105   < > 109   < > 108  --   --  107 111 107 103  CO2 14*   < > 15*   < > 22  --   --  21* 20* 19* 21*  GLUCOSE 448*   < > 182*   < > 172*  --   --  195* 169* 173* 228*  BUN 22   < > 20   < > 17  --   --  15 15 19  25*  CREATININE 1.90*   < > 1.71*   < > 1.44*   < > 1.39* 1.37* 1.33* 1.45* 1.65*  CALCIUM 8.6*   < > 8.1*   < > 8.0*  --   --   8.2* 7.9* 7.9* 8.2*  MG 1.3*  --  1.4*  --   --   --   --  1.4* 1.9  --   --    < > = values in this interval not displayed.   GFR: Estimated Creatinine Clearance: 38.7 mL/min (A) (by C-G formula based on SCr of 1.65 mg/dL (H)).  Liver Function Tests: Recent Labs  Lab 04/27/20 0055 04/28/20 1253  AST 24 27  ALT 14 13  ALKPHOS 67 55  BILITOT 2.1* 1.0  PROT 7.4 6.0*  ALBUMIN 3.5 2.9*   No results for input(s): LIPASE, AMYLASE in the last 168 hours. No results for input(s): AMMONIA in the last 168 hours. Coagulation Profile: Recent Labs  Lab 04/27/20 0055  INR 1.0   Cardiac Enzymes: No results for input(s): CKTOTAL, CKMB, CKMBINDEX, TROPONINI in the last 168 hours. BNP (last 3 results) No results for input(s): PROBNP in the last 8760 hours. HbA1C: No results for input(s): HGBA1C in the last 72 hours. CBG: Recent Labs  Lab 04/30/20 1225 04/30/20 1704 04/30/20 2129 05/01/20 0744 05/01/20 1215  GLUCAP 386* 268* 214* 223* 257*   Lipid Profile: No results for input(s): CHOL, HDL, LDLCALC, TRIG, CHOLHDL, LDLDIRECT in the last 72 hours. Thyroid Function Tests: No results for input(s): TSH, T4TOTAL, FREET4, T3FREE, THYROIDAB in the last 72 hours. Anemia Panel: No results for input(s): VITAMINB12, FOLATE, FERRITIN, TIBC, IRON, RETICCTPCT in the last 72 hours. Sepsis Labs: Recent Labs  Lab 04/27/20 0343 04/27/20 0610 04/27/20 0919 04/27/20 1821  LATICACIDVEN 2.3* 4.7* 2.0* 0.8    Recent Results (from the past 240 hour(s))  Blood Culture (routine x 2)     Status: Abnormal (Preliminary result)   Collection Time: 04/27/20 12:55 AM   Specimen: BLOOD  Result Value Ref Range Status   Specimen Description   Final    BLOOD LEFT [facility] Performed at [facility], [address] [place], [place] [address]    Special Requests   Final    BOTTLES DRAWN AEROBIC AND ANAEROBIC Blood Culture results may not be optimal due to an excessive volume of blood received in culture  bottles Performed at [facility], [address], [place], [place] [address]    Culture  Setup Time   Final    GRAM POSITIVE COCCI IN BOTH AEROBIC AND ANAEROBIC BOTTLES CRITICAL RESULT CALLED TO, READ BACK BY AND VERIFIED WITH: SUSAN WATSON AT 7253 ON 04/27/2020 [place].    Culture (A)  Final    STAPHYLOCOCCUS AUREUS ENTEROCOCCUS FAECALIS STAPHYLOCOCCUS SIMULANS CULTURE REINCUBATED FOR BETTER GROWTH Performed at [facility], [place] [address], [place], [place] [address]    Report Status PENDING  Incomplete   Organism ID, Bacteria STAPHYLOCOCCUS AUREUS  Final      Susceptibility   Staphylococcus aureus - MIC*    CIPROFLOXACIN >=8 RESISTANT Resistant     ERYTHROMYCIN >=8 RESISTANT Resistant     GENTAMICIN <=0.5 SENSITIVE Sensitive     OXACILLIN <=0.25 SENSITIVE Sensitive     TETRACYCLINE <=1 SENSITIVE Sensitive     VANCOMYCIN <=0.5 SENSITIVE Sensitive     TRIMETH/SULFA <=10 SENSITIVE Sensitive     CLINDAMYCIN RESISTANT Resistant     RIFAMPIN <=0.5 SENSITIVE Sensitive     Inducible Clindamycin POSITIVE Resistant     * STAPHYLOCOCCUS AUREUS  Blood Culture (routine x 2)     Status: None (Preliminary result)   Collection Time: 04/27/20 12:55 AM   Specimen: BLOOD  Result Value Ref Range Status   Specimen Description BLOOD RIGTH HAND  Final   Special Requests   Final    BOTTLES DRAWN AEROBIC AND ANAEROBIC Blood Culture adequate volume   Culture   Final    NO GROWTH 4 DAYS Performed at [facility], [address], [place], [place] [address]    Report Status PENDING  Incomplete  Respiratory  Panel by RT PCR (Flu A&B, Covid) - Nasopharyngeal Swab     Status: None   Collection Time: 04/27/20 12:55 AM   Specimen: Nasopharyngeal Swab  Result Value Ref Range Status   SARS Coronavirus 2 by RT PCR NEGATIVE NEGATIVE Final    Comment: (NOTE) SARS-CoV-2 target nucleic acids are NOT DETECTED.  The SARS-CoV-2 RNA is generally detectable in upper respiratoy specimens  during the acute phase of infection. The lowest concentration of SARS-CoV-2 viral copies this assay can detect is 131 copies/mL. A negative result does not preclude SARS-Cov-2 infection and should not be used as the sole basis for treatment or other patient management decisions. A negative result may occur with  improper specimen collection/handling, submission of specimen other than nasopharyngeal swab, presence of viral mutation(s) within the areas targeted by this assay, and inadequate number of viral copies (<131 copies/mL). A negative result must be combined with clinical observations, patient history, and epidemiological information. The expected result is Negative.  Fact Sheet for Patients:  PinkCheek.be  Fact Sheet for Healthcare Providers:  GravelBags.it  This test is no t yet approved or cleared by the [place] FDA and  has been authorized for detection and/or diagnosis of SARS-CoV-2 by FDA under an Emergency Use Authorization (EUA). This EUA will remain  in effect (meaning this test can be used) for the duration of the COVID-19 declaration under Section 564(b)(1) of the Act, 21 U.S.C. section 360bbb-3(b)(1), unless the authorization is terminated or revoked sooner.     Influenza A by PCR NEGATIVE NEGATIVE Final   Influenza B by PCR NEGATIVE NEGATIVE Final    Comment: (NOTE) The Xpert Xpress SARS-CoV-2/FLU/RSV assay is intended as an aid in  the diagnosis of influenza from Nasopharyngeal swab specimens and  should not be used as a sole basis for treatment. Nasal washings and  aspirates are unacceptable for Xpert Xpress SARS-CoV-2/FLU/RSV  testing.  Fact Sheet for Patients: PinkCheek.be  Fact Sheet for Healthcare Providers: GravelBags.it  This test is not yet approved or cleared by the [place] FDA and  has been authorized for detection and/or  diagnosis of SARS-CoV-2 by  FDA under an Emergency Use Authorization (EUA). This EUA will remain  in effect (meaning this test can be used) for the duration of the  Covid-19 declaration under Section 564(b)(1) of the Act, 21  U.S.C. section 360bbb-3(b)(1), unless the authorization is  terminated or revoked. Performed at [facility], [address] [place], [place] [address]   Blood Culture ID Panel (Reflexed)     Status: Abnormal   Collection Time: 04/27/20 12:55 AM  Result Value Ref Range Status   Enterococcus faecalis NOT DETECTED NOT DETECTED Final   Enterococcus Faecium NOT DETECTED NOT DETECTED Final   Listeria monocytogenes NOT DETECTED NOT DETECTED Final   Staphylococcus species DETECTED (A) NOT DETECTED Final    Comment: CRITICAL RESULT CALLED TO, READ BACK BY AND VERIFIED WITH: SUSAN WATSON AT 1424 ON 04/27/2020 [place].    Staphylococcus aureus (BCID) DETECTED (A) NOT DETECTED Final    Comment: CRITICAL RESULT CALLED TO, READ BACK BY AND VERIFIED WITH: SUSAN WATSON AT 1424 ON 04/27/2020 [place].    Staphylococcus epidermidis DETECTED (A) NOT DETECTED Final    Comment: CRITICAL RESULT CALLED TO, READ BACK BY AND VERIFIED WITH: SUSAN WATSON AT 1424 ON 04/27/2020 [place].    Staphylococcus lugdunensis NOT DETECTED NOT DETECTED Final   Streptococcus species NOT DETECTED NOT DETECTED Final   Streptococcus agalactiae NOT DETECTED NOT DETECTED Final  Streptococcus pneumoniae NOT DETECTED NOT DETECTED Final   Streptococcus pyogenes NOT DETECTED NOT DETECTED Final   A.calcoaceticus-baumannii NOT DETECTED NOT DETECTED Final   Bacteroides fragilis NOT DETECTED NOT DETECTED Final   Enterobacterales NOT DETECTED NOT DETECTED Final   Enterobacter cloacae complex NOT DETECTED NOT DETECTED Final   Escherichia coli NOT DETECTED NOT DETECTED Final   Klebsiella aerogenes NOT DETECTED NOT DETECTED Final   Klebsiella oxytoca NOT DETECTED NOT DETECTED Final   Klebsiella pneumoniae NOT  DETECTED NOT DETECTED Final   Proteus species NOT DETECTED NOT DETECTED Final   Salmonella species NOT DETECTED NOT DETECTED Final   Serratia marcescens NOT DETECTED NOT DETECTED Final   Haemophilus influenzae NOT DETECTED NOT DETECTED Final   Neisseria meningitidis NOT DETECTED NOT DETECTED Final   Pseudomonas aeruginosa NOT DETECTED NOT DETECTED Final   Stenotrophomonas maltophilia NOT DETECTED NOT DETECTED Final   Candida albicans NOT DETECTED NOT DETECTED Final   Candida auris NOT DETECTED NOT DETECTED Final   Candida glabrata NOT DETECTED NOT DETECTED Final   Candida krusei NOT DETECTED NOT DETECTED Final   Candida parapsilosis NOT DETECTED NOT DETECTED Final   Candida tropicalis NOT DETECTED NOT DETECTED Final   Cryptococcus neoformans/gattii NOT DETECTED NOT DETECTED Final   Methicillin resistance mecA/C DETECTED (A) NOT DETECTED Final    Comment: CRITICAL RESULT CALLED TO, READ BACK BY AND VERIFIED WITH: SUSAN WATSON AT 1424 ON 04/27/2020 [place].    Meth resistant mecA/C and MREJ NOT DETECTED NOT DETECTED Final    Comment: Performed at [facility], [address] [place], [place] [address]  Urine culture     Status: Abnormal   Collection Time: 04/27/20  1:57 AM   Specimen: In/Out Cath Urine  Result Value Ref Range Status   Specimen Description   Final    IN/OUT CATH URINE Performed at [facility], [address], [place], [place] [address]    Special Requests   Final    NONE Performed at [facility], [address] [place], [place] [address]    Culture MULTIPLE SPECIES PRESENT, SUGGEST RECOLLECTION (A)  Final   Report Status 04/27/2020 FINAL  Final  CULTURE, BLOOD (ROUTINE X 2) w Reflex to ID Panel     Status: None (Preliminary result)   Collection Time: 04/29/20  7:05 PM   Specimen: BLOOD  Result Value Ref Range Status   Specimen Description BLOOD BLOOD RIGHT HAND  Final   Special Requests   Final    BOTTLES DRAWN AEROBIC AND ANAEROBIC  Blood Culture adequate volume   Culture   Final    NO GROWTH 2 DAYS Performed at [facility], [address] [place], [place] [address]    Report Status PENDING  Incomplete  CULTURE, BLOOD (ROUTINE X 2) w Reflex to ID Panel     Status: None (Preliminary result)   Collection Time: 04/29/20  7:06 PM   Specimen: BLOOD  Result Value Ref Range Status   Specimen Description BLOOD RIGHT ANTECUBITAL  Final   Special Requests   Final    BOTTLES DRAWN AEROBIC AND ANAEROBIC Blood Culture results may not be optimal due to an excessive volume of blood received in culture bottles   Culture   Final    NO GROWTH 2 DAYS Performed at [facility], [address], [place],  [address]    Report Status PENDING  Incomplete         Radiology Studies: No results found.  Scheduled Meds: . amLODipine  10 mg Oral Daily  . aspirin  81 mg Oral Daily  . atorvastatin  20 mg Oral q1800  . clopidogrel  75 mg Oral Daily  . enalapril  20 mg Oral BID  . enoxaparin (LOVENOX) injection  0.5 mg/kg Subcutaneous Q24H  . feeding supplement (NEPRO CARB STEADY)  237 mL Oral BID BM  . gabapentin  200 mg Oral BID  . hydrALAZINE  100 mg Oral BID  . hydrochlorothiazide  25 mg Oral Daily  . insulin aspart  0-9 Units Subcutaneous TID WC  . insulin glargine  10 Units Subcutaneous Daily  . levETIRAcetam  500 mg Oral BID  . metoprolol succinate  100 mg Oral Daily  . multivitamin with minerals  1 tablet Oral Daily   Continuous Infusions: . vancomycin 1,000 mg (04/30/20 1624)     LOS: 4 days    Time spent: 25 mins     Desma Maxim, MD [facility]  If 7PM-7AM, please contact night-coverage [email or web address] 05/01/2020, 12:25 PM

## 2020-05-02 ENCOUNTER — Inpatient Hospital Stay: Payer: Self-pay

## 2020-05-02 DIAGNOSIS — R7881 Bacteremia: Secondary | ICD-10-CM | POA: Diagnosis not present

## 2020-05-02 LAB — GLUCOSE, CAPILLARY
Glucose-Capillary: 256 mg/dL — ABNORMAL HIGH (ref 70–99)
Glucose-Capillary: 332 mg/dL — ABNORMAL HIGH (ref 70–99)
Glucose-Capillary: 307 mg/dL — ABNORMAL HIGH (ref 70–99)
Glucose-Capillary: 240 mg/dL — ABNORMAL HIGH (ref 70–99)

## 2020-05-02 LAB — CULTURE, BLOOD (ROUTINE X 2)
Culture: NO GROWTH
Special Requests: ADEQUATE

## 2020-05-02 LAB — CBC WITH DIFFERENTIAL/PLATELET
Abs Immature Granulocytes: 0.16 10*3/uL — ABNORMAL HIGH (ref 0.00–0.07)
Basophils Absolute: 0 10*3/uL (ref 0.0–0.1)
Basophils Relative: 0 %
Eosinophils Absolute: 0.3 10*3/uL (ref 0.0–0.5)
Eosinophils Relative: 3 %
HCT: 28.2 % — ABNORMAL LOW (ref 36.0–46.0)
Hemoglobin: 9.4 g/dL — ABNORMAL LOW (ref 12.0–15.0)
Immature Granulocytes: 2 %
Lymphocytes Relative: 26 %
Lymphs Abs: 2 10*3/uL (ref 0.7–4.0)
MCH: 27.7 pg (ref 26.0–34.0)
MCHC: 33.3 g/dL (ref 30.0–36.0)
MCV: 83.2 fL (ref 80.0–100.0)
Monocytes Absolute: 1.1 10*3/uL — ABNORMAL HIGH (ref 0.1–1.0)
Monocytes Relative: 14 %
Neutro Abs: 4.3 10*3/uL (ref 1.7–7.7)
Neutrophils Relative %: 55 %
Platelets: 235 10*3/uL (ref 150–400)
RBC: 3.39 MIL/uL — ABNORMAL LOW (ref 3.87–5.11)
RDW: 15 % (ref 11.5–15.5)
WBC: 7.9 10*3/uL (ref 4.0–10.5)
nRBC: 0 % (ref 0.0–0.2)

## 2020-05-02 LAB — VANCOMYCIN, TROUGH
Vancomycin Tr: 24 ug/mL (ref 15–20)
Vancomycin Tr: 21 ug/mL (ref 15–20)

## 2020-05-02 LAB — BASIC METABOLIC PANEL
Anion gap: 7 (ref 5–15)
BUN: 33 mg/dL — ABNORMAL HIGH (ref 8–23)
CO2: 23 mmol/L (ref 22–32)
Calcium: 7.9 mg/dL — ABNORMAL LOW (ref 8.9–10.3)
Chloride: 102 mmol/L (ref 98–111)
Creatinine, Ser: 1.65 mg/dL — ABNORMAL HIGH (ref 0.44–1.00)
GFR, Estimated: 32 mL/min — ABNORMAL LOW (ref >60)
Glucose, Bld: 290 mg/dL — ABNORMAL HIGH (ref 70–99)
Potassium: 4.1 mmol/L (ref 3.5–5.1)
Sodium: 132 mmol/L — ABNORMAL LOW (ref 135–145)

## 2020-05-02 MED ORDER — INSULIN GLARGINE 100 UNIT/ML ~~LOC~~ SOLN
18.0000 [IU] | Freq: Every day | SUBCUTANEOUS | Status: DC
  Filled 2020-05-02: qty 0.18

## 2020-05-02 MED ORDER — ENOXAPARIN SODIUM 60 MG/0.6ML ~~LOC~~ SOLN
0.5000 mg/kg | SUBCUTANEOUS | Status: DC
  Filled 2020-05-02: qty 0.6
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
## 2020-05-02 NOTE — TOC Progression Note (Signed)
Transition of Care [facility]) - Progression Note    Patient Details  Name: Susan House MRN: [id number] Date of Birth: [birth year]  Transition of [facility]) CM/SW [place], LCSW Phone Number: 05/02/2020, 1:46 PM  Clinical Narrative:    PT notified CSW patient is being recommended for Skilled Nursing for rehab. CSW met with patient at bedside to discuss options for placement. Patient stated she does not have a preference and she would prefer I call her family for their input. CSW confirmed patient request and agreed. CSW called patient's daughter, Susan House, introduced CSW roll, ask if she has a preference of facilities for patient's placement. Candice stated she would prefer [facility] or [facility], however she understands they may have to accept other options depending on bed availability. CSW confirmed it will depend on which facilities are willing to offer a bed for the patient. CSW explained briefly the process of sending out referrals and waiting for bed offer, CSW reiterated making sure to call Candice with any updates.   2:00pm CSW submitted auth request via portal with start date of 10/18          Expected Discharge Plan and Services                                                 Social Determinants of Health (SDOH) Interventions    Readmission Risk Interventions No flowsheet data found.

## 2020-05-02 NOTE — Progress Notes (Signed)
Robin RN VAS Team spoke with Roselyn Reef RN re PICC.  Per Dr Si Raider progress note, PICC placement once blood cultures result negative x 72 hours- will be at 1906 this pm.  Plan on PICC placement 05/03/20.  Roselyn Reef RN states pt can give consent for procedure. Attempted to call spouse for verification of pt giving consent, no answer to multiple attempts and different numbers. Spoke with daughter, [name] who states that the patient does normally give her own consents.

## 2020-05-02 NOTE — Progress Notes (Addendum)
Pharmacy Antibiotic Note  Susan House is a 64 y.o. female admitted on 04/27/2020 with bacteremia likely secondary to UTI. PMH for HTN, dyslipidemia, CVA and TIA. Patient was started on ceftriaxone and BCx 1/2 sets (both bottles) with GPC S. Aureus and epidermidis, methicillin resistance detected. Interestingly, culture has growing S. Aureus and E. Faecalis. Pharmacy has been consulted for vancomycin dosing.   Scr today 1.65 again.   Today, 05/02/2020 Day #6 vancomycin (last dose 10/15 @ 1624)  Scr 1.44>1.37>1.33>1.45 > 1.65>1.65  WBC 11.4>6.3>6.6> 6.9>7.9  Afebrile  TTE results neg. Planning TEE this week  Blood cx with Enterococcus faecalis and S. Aureus  Source not clear at this time  Trough 10/16 @ 1437 =  28 - Supratherapeutic  Trough 10/17 @ 0408 = 24 - still supratherapeutic (~36 hrs after last dose)    Plan:  Vanc trough still supratherapeutic, HOLD vancomycin and recheck vanc trough today at 1600 (~48 hrs after lst dose). Plan to re-dose Vancomycin when level </= 20 mcg/ml.   Will check Scr with AM labs.  Trough goal = 15-20 mcg/ml   Continue to monitor renal function   Height: 5' 3" (160 cm) Weight: 92.2 kg (203 lb 4.2 oz) IBW/kg (Calculated) : 52.4  Temp (24hrs), Avg:98.1 F (36.7 C), Min:97.7 F (36.5 C), Max:98.4 F (36.9 C)  Recent Labs  Lab 04/27/20 0055 04/27/20 0343 04/27/20 0522 04/27/20 0610 04/27/20 0919 04/27/20 1821 04/28/20 0547 04/28/20 1253 04/29/20 0535 04/29/20 1134 04/29/20 1500 04/30/20 0457 05/01/20 0534 05/01/20 1437 05/02/20 0408  WBC 11.4*  --   --   --   --   --   --   --   --  6.3  --  6.6 6.9  --  7.9  CREATININE 1.90*  --    < >  --  1.44*  --    < > 1.37* 1.33*  --   --  1.45* 1.65*  --  1.65*  LATICACIDVEN 2.1* 2.3*  --  4.7* 2.0* 0.8  --   --   --   --   --   --   --   --   --   VANCOTROUGH  --   --   --   --   --   --   --   --   --   --    < >  --   --  28* 24*   < > = values in this interval not  displayed.    Estimated Creatinine Clearance: 37.1 mL/min (A) (by C-G formula based on SCr of 1.65 mg/dL (H)).    Allergies  Allergen Reactions  . Morphine And Related Anaphylaxis  . Reglan [Metoclopramide]     Other reaction(s): Unknown Elevated BP  . Simvastatin Other (See Comments)    Muscle pain  . Betadine [Povidone Iodine] Rash  . Tetracyclines & Related Rash    Other reaction(s): Unknown    Antimicrobials this admission: 10/12 Ceftriaxone >> 10/13 10/12 Vancomycin >>   Microbiology results: 10/12 BCx: GPC 1/2 sets (both bottles) S. Aureus and epidermidis, methicillin resistance detected. Cx growing S. Aureus and E. Faecalis, Staph simulans, staph hominis, staph epi.   10/14 Bcx NGTD 10/12 UCx: mx species   Thank you for allowing pharmacy to be a part of this patient's care.  Noralee Space, PharmD Clinical Pharmacist 05/02/2020 10:37 AM

## 2020-05-02 NOTE — Progress Notes (Signed)
PROGRESS NOTE    Susan House  OIZ:[id number] DOB: 11/29/1955 DOA: 04/27/2020 PCP: Jerrol Banana., MD   Susan House  is a 64 y.o. Caucasian female with a known history of multiple medical problems that are mentioned below including hypertension, dyslipidemia, CVA and TIA, who presented to the emergency room with acute onset of altered mental status.  She has been having decreased energy level and vomiting at home.  She is a very poor historian and has not been able to provide any meaningful history.  She was tachycardic and tachypneic upon arrival.  She was having extensive fecal material around her and several bruises.  No reported chest pain or palpitations.  No reported cough or wheezing or dyspnea.  It is unclear if she has been having urinary symptoms including dysuria or frequency but she answers yes to all questions.  Upon presentation to the emergency room blood pressure was 167/92 with a heart rate of 126 and respiratory to 26 with pulse symmetry of 98% on room air.  Labs revealed ABG with pH 7.37 and HCO3 of 12.7 and BMP revealed a CO2 of 14 with a blood glucose of 448 and a creatinine of 1.9 with anion gap of 23 magnesium of 1.3 and total bili of 2.1 with lactic acid of 2.1 later 2.3 and CBC showed leukocytosis 11.4 with relative neutrophilia.  Beta hydroxybutyrate was 7.11.  COVID-19 PCR and influenza antigens came back negative.  UA showed more than 50 WBCs more than 300 protein and 20 ketones and more than 500 glucose.  The patient was given 2 g of IV magnesium sulfate, total bolus of IV lactated Ringer followed by 125 mL/h; of IV potassium chloride.  She will be admitted to a stepdown unit for further evaluation and management.     Assessment & Plan:   Active Problems:   DKA (diabetic ketoacidosis) ([place])   Pressure injury of skin   DKA: secondary to poorly controlled DM2 and bloodstream infection. Resolved. Glucose today 200s. Off insulin gtt and IV  fluids - increase lantus from 13 to 18 qhs, continue SSI - start carb modified diet  Bacteremia  Sepsis:  presented w/ tachycardia, tachypnea & bacteremia & possible UTI. Has spinal hardware, no tenderness at site. Hemodynamically stable currently. Blood cultures growing MRSA, second set obtained 10/14 ng x 3 days - appreciate ID recs - continue vancomycin - TTE neg for vegetations, ID advising TEE, have contacted [facility] cardiology, plan for TEE next week. Timing TBD - picc order placed, likely to be placed tomorrow - plan on d/c to SNF, have message care mgmt to start working on that  Hypokalemia - k normal today - monitor  Normocytic anemia - baseline hgb 11s, here has been trending 9-10. No melena or hematochezia or other bleeding - monitor  Encephalopathy: likely metabolic & secondary to above. Patient has improved. CT head neg for acute intracranial findings, but atrophy & chronic infarcts present.  - slp consulted - dysphagia 2 diet, thin liquids  AKI: likely prerenal from dehydration. . 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 Has appetite, no fevers, no pain.   Objective: Vitals:   05/01/20 1916 05/02/20 0418 05/02/20 0724 05/02/20  1118  BP: (!) 138/59 (!) 178/75 (!) 171/66 (!) 141/53  Pulse: 81 79 86 78  Resp: 18 18 14 18   Temp: 98.4 F (36.9 C) 97.7 F (36.5 C) 97.9 F (36.6 C) 97.9 F (36.6 C)  TempSrc:  Oral  Oral  SpO2: 100% 97% 99% 100%  Weight:  92.2 kg    Height:        Intake/Output Summary (Last 24 hours) at 05/02/2020 1221 Last data filed at 05/02/2020 0336 Gross per 24 hour  Intake 240 ml  Output 550 ml  Net -310 ml   Filed Weights   04/30/20 0406 05/01/20 0426 05/02/20 0418  Weight: 96.2 kg 99.2 kg 92.2 kg    Examination:  General exam: Alert, pleasant. Morbidly obese Respiratory system: diminished breath sounds b/l  Cardiovascular system: S1 & S2 +. No ubs, gallops or clicks. Trace pedal edema Gastrointestinal system: Abdomen is obese, soft and nontender. Hypoactive bowel sounds Central nervous system: Awake, alert, moving all 4 extremities.  Psychiatry: aao x3 today    Data Reviewed: I have personally reviewed following labs and imaging studies  CBC: Recent Labs  Lab 04/27/20 0055 04/29/20 1134 04/30/20 0457 05/01/20 0534 05/02/20 0408  WBC 11.4* 6.3 6.6 6.9 7.9  NEUTROABS 10.4* 4.2 3.3 3.3 4.3  HGB 11.6* 9.7* 10.0* 9.3* 9.4*  HCT 35.1* 30.0* 30.3* 28.3* 28.2*  MCV 85.8 86.2 85.4 84.2 83.2  PLT 265 242 224 203 092   Basic Metabolic Panel: Recent Labs  Lab 04/27/20 0055 04/27/20 0055 04/27/20 0522 04/27/20 0919 04/28/20 1253 04/29/20 0535 04/30/20 0457 05/01/20 0534 05/02/20 0408  NA 142   < > 143   < > 143 140 136 133* 132*  K 3.6   < > 3.5   < > 3.1* 4.6 4.0 4.0 4.1  CL 105   < > 109   < > 107 111 107 103 102  CO2 14*   < > 15*   < > 21* 20* 19* 21* 23  GLUCOSE 448*   < > 182*   < > 195* 169* 173* 228* 290*  BUN 22   < > 20   < > 15 15 19  25* 33*  CREATININE 1.90*   < > 1.71*   < > 1.37* 1.33* 1.45* 1.65* 1.65*  CALCIUM 8.6*   < > 8.1*   < > 8.2* 7.9* 7.9* 8.2* 7.9*  MG 1.3*  --  1.4*  --  1.4* 1.9  --   --   --    < > = values in this interval not  displayed.   GFR: Estimated Creatinine Clearance: 37.1 mL/min (A) (by C-G formula based on SCr of 1.65 mg/dL (H)). Liver Function Tests: Recent Labs  Lab 04/27/20 0055 04/28/20 1253  AST 24 27  ALT 14 13  ALKPHOS 67 55  BILITOT 2.1* 1.0  PROT 7.4 6.0*  ALBUMIN 3.5 2.9*   No results for input(s): LIPASE, AMYLASE in the last 168 hours. No results for input(s): AMMONIA in the  last 168 hours. Coagulation Profile: Recent Labs  Lab 04/27/20 0055  INR 1.0   Cardiac Enzymes: No results for input(s): CKTOTAL, CKMB, CKMBINDEX, TROPONINI in the last 168 hours. BNP (last 3 results) No results for input(s): PROBNP in the last 8760 hours. HbA1C: No results for input(s): HGBA1C in the last 72 hours. CBG: Recent Labs  Lab 05/01/20 1215 05/01/20 1655 05/01/20 2102 05/02/20 0727 05/02/20 1119  GLUCAP 257* 364* 249* 256* 332*   Lipid Profile: No results for input(s): CHOL, HDL, LDLCALC, TRIG, CHOLHDL, LDLDIRECT in the last 72 hours. Thyroid Function Tests: No results for input(s): TSH, T4TOTAL, FREET4, T3FREE, THYROIDAB in the last 72 hours. Anemia Panel: No results for input(s): VITAMINB12, FOLATE, FERRITIN, TIBC, IRON, RETICCTPCT in the last 72 hours. Sepsis Labs: Recent Labs  Lab 04/27/20 0343 04/27/20 0610 04/27/20 0919 04/27/20 1821  LATICACIDVEN 2.3* 4.7* 2.0* 0.8    Recent Results (from the past 240 hour(s))  Blood Culture (routine x 2)     Status: Abnormal   Collection Time: 04/27/20 12:55 AM   Specimen: BLOOD  Result Value Ref Range Status   Specimen Description   Final    BLOOD LEFT [facility] Performed at [facility], [address], [place], [place] [address]    Special Requests   Final    BOTTLES DRAWN AEROBIC AND ANAEROBIC Blood Culture results may not be optimal due to an excessive volume of blood received in culture bottles Performed at [facility], [address], [place], [place] [address]    Culture  Setup Time   Final    GRAM  POSITIVE COCCI IN BOTH AEROBIC AND ANAEROBIC BOTTLES CRITICAL RESULT CALLED TO, READ BACK BY AND VERIFIED WITH: SUSAN WATSON AT 2876 ON 04/27/2020 [place].    Culture (A)  Final    STAPHYLOCOCCUS AUREUS ENTEROCOCCUS FAECALIS STAPHYLOCOCCUS SIMULANS STAPHYLOCOCCUS HOMINIS STAPHYLOCOCCUS EPIDERMIDIS ID BY BCID UNABLE TO RECOVER FROM CULTURE THE SIGNIFICANCE OF ISOLATING THIS ORGANISM FROM A SINGLE SET OF BLOOD CULTURES WHEN MULTIPLE SETS ARE DRAWN IS UNCERTAIN. PLEASE NOTIFY THE MICROBIOLOGY DEPARTMENT WITHIN ONE WEEK IF SPECIATION AND SENSITIVITIES ARE REQUIRED. Performed at [facility], [place] [address] [place], [place] [address]    Report Status 05/02/2020 FINAL  Final   Organism ID, Bacteria STAPHYLOCOCCUS AUREUS  Final   Organism ID, Bacteria ENTEROCOCCUS FAECALIS  Final      Susceptibility   Enterococcus faecalis - MIC*    AMPICILLIN <=2 SENSITIVE Sensitive     VANCOMYCIN 1 SENSITIVE Sensitive     GENTAMICIN SYNERGY SENSITIVE Sensitive     * ENTEROCOCCUS FAECALIS   Staphylococcus aureus - MIC*    CIPROFLOXACIN >=8 RESISTANT Resistant     ERYTHROMYCIN >=8 RESISTANT Resistant     GENTAMICIN <=0.5 SENSITIVE Sensitive     OXACILLIN <=0.25 SENSITIVE Sensitive     TETRACYCLINE <=1 SENSITIVE Sensitive     VANCOMYCIN <=0.5 SENSITIVE Sensitive     TRIMETH/SULFA <=10 SENSITIVE Sensitive     CLINDAMYCIN RESISTANT Resistant     RIFAMPIN <=0.5 SENSITIVE Sensitive     Inducible Clindamycin POSITIVE Resistant     * STAPHYLOCOCCUS AUREUS  Blood Culture (routine x 2)     Status: None   Collection Time: 04/27/20 12:55 AM   Specimen: BLOOD  Result Value Ref Range Status   Specimen Description BLOOD RIGTH HAND  Final   Special Requests   Final    BOTTLES DRAWN AEROBIC AND ANAEROBIC Blood Culture adequate volume   Culture   Final  NO GROWTH 5 DAYS Performed at [facility], [address] [place], [place] [address]    Report Status 05/02/2020 FINAL  Final  Respiratory  Panel by RT PCR (Flu A&B, Covid) - Nasopharyngeal Swab     Status: None   Collection Time: 04/27/20 12:55 AM   Specimen: Nasopharyngeal Swab  Result Value Ref Range Status   SARS Coronavirus 2 by RT PCR NEGATIVE NEGATIVE Final    Comment: (NOTE) SARS-CoV-2 target nucleic acids are NOT DETECTED.  The SARS-CoV-2 RNA is generally detectable in upper respiratoy specimens during the acute phase of infection. The lowest concentration of SARS-CoV-2 viral copies this assay can detect is 131 copies/mL. A negative result does not preclude SARS-Cov-2 infection and should not be used as the sole basis for treatment or other patient management decisions. A negative result may occur with  improper specimen collection/handling, submission of specimen other than nasopharyngeal swab, presence of viral mutation(s) within the areas targeted by this assay, and inadequate number of viral copies (<131 copies/mL). A negative result must be combined with clinical observations, patient history, and epidemiological information. The expected result is Negative.  Fact Sheet for Patients:  PinkCheek.be  Fact Sheet for Healthcare Providers:  GravelBags.it  This test is no t yet approved or cleared by the [place] FDA and  has been authorized for detection and/or diagnosis of SARS-CoV-2 by FDA under an Emergency Use Authorization (EUA). This EUA will remain  in effect (meaning this test can be used) for the duration of the COVID-19 declaration under Section 564(b)(1) of the Act, 21 U.S.C. section 360bbb-3(b)(1), unless the authorization is terminated or revoked sooner.     Influenza A by PCR NEGATIVE NEGATIVE Final   Influenza B by PCR NEGATIVE NEGATIVE Final    Comment: (NOTE) The Xpert Xpress SARS-CoV-2/FLU/RSV assay is intended as an aid in  the diagnosis of influenza from Nasopharyngeal swab specimens and  should not be used as a sole basis  for treatment. Nasal washings and  aspirates are unacceptable for Xpert Xpress SARS-CoV-2/FLU/RSV  testing.  Fact Sheet for Patients: PinkCheek.be  Fact Sheet for Healthcare Providers: GravelBags.it  This test is not yet approved or cleared by the [place] FDA and  has been authorized for detection and/or diagnosis of SARS-CoV-2 by  FDA under an Emergency Use Authorization (EUA). This EUA will remain  in effect (meaning this test can be used) for the duration of the  Covid-19 declaration under Section 564(b)(1) of the Act, 21  U.S.C. section 360bbb-3(b)(1), unless the authorization is  terminated or revoked. Performed at [facility], [address] [place], [place] [address]   Blood Culture ID Panel (Reflexed)     Status: Abnormal   Collection Time: 04/27/20 12:55 AM  Result Value Ref Range Status   Enterococcus faecalis NOT DETECTED NOT DETECTED Final   Enterococcus Faecium NOT DETECTED NOT DETECTED Final   Listeria monocytogenes NOT DETECTED NOT DETECTED Final   Staphylococcus species DETECTED (A) NOT DETECTED Final    Comment: CRITICAL RESULT CALLED TO, READ BACK BY AND VERIFIED WITH: SUSAN WATSON AT 1424 ON 04/27/2020 [place].    Staphylococcus aureus (BCID) DETECTED (A) NOT DETECTED Final    Comment: CRITICAL RESULT CALLED TO, READ BACK BY AND VERIFIED WITH: SUSAN WATSON AT 1424 ON 04/27/2020 [place].    Staphylococcus epidermidis DETECTED (A) NOT DETECTED Final    Comment: CRITICAL RESULT CALLED TO, READ BACK BY AND VERIFIED WITH: SUSAN WATSON AT 1424 ON 04/27/2020 [place].  Staphylococcus lugdunensis NOT DETECTED NOT DETECTED Final   Streptococcus species NOT DETECTED NOT DETECTED Final   Streptococcus agalactiae NOT DETECTED NOT DETECTED Final   Streptococcus pneumoniae NOT DETECTED NOT DETECTED Final   Streptococcus pyogenes NOT DETECTED NOT DETECTED Final   A.calcoaceticus-baumannii NOT DETECTED NOT  DETECTED Final   Bacteroides fragilis NOT DETECTED NOT DETECTED Final   Enterobacterales NOT DETECTED NOT DETECTED Final   Enterobacter cloacae complex NOT DETECTED NOT DETECTED Final   Escherichia coli NOT DETECTED NOT DETECTED Final   Klebsiella aerogenes NOT DETECTED NOT DETECTED Final   Klebsiella oxytoca NOT DETECTED NOT DETECTED Final   Klebsiella pneumoniae NOT DETECTED NOT DETECTED Final   Proteus species NOT DETECTED NOT DETECTED Final   Salmonella species NOT DETECTED NOT DETECTED Final   Serratia marcescens NOT DETECTED NOT DETECTED Final   Haemophilus influenzae NOT DETECTED NOT DETECTED Final   Neisseria meningitidis NOT DETECTED NOT DETECTED Final   Pseudomonas aeruginosa NOT DETECTED NOT DETECTED Final   Stenotrophomonas maltophilia NOT DETECTED NOT DETECTED Final   Candida albicans NOT DETECTED NOT DETECTED Final   Candida auris NOT DETECTED NOT DETECTED Final   Candida glabrata NOT DETECTED NOT DETECTED Final   Candida krusei NOT DETECTED NOT DETECTED Final   Candida parapsilosis NOT DETECTED NOT DETECTED Final   Candida tropicalis NOT DETECTED NOT DETECTED Final   Cryptococcus neoformans/gattii NOT DETECTED NOT DETECTED Final   Methicillin resistance mecA/C DETECTED (A) NOT DETECTED Final    Comment: CRITICAL RESULT CALLED TO, READ BACK BY AND VERIFIED WITH: SUSAN WATSON AT 1424 ON 04/27/2020 [place].    Meth resistant mecA/C and MREJ NOT DETECTED NOT DETECTED Final    Comment: Performed at [facility], [address] [place], [place] [address]  Urine culture     Status: Abnormal   Collection Time: 04/27/20  1:57 AM   Specimen: In/Out Cath Urine  Result Value Ref Range Status   Specimen Description   Final    IN/OUT CATH URINE Performed at [facility], [address] [place], [place] [address]    Special Requests   Final    NONE Performed at [facility], [address] [place], [place] [address]    Culture MULTIPLE SPECIES  PRESENT, SUGGEST RECOLLECTION (A)  Final   Report Status 04/27/2020 FINAL  Final  CULTURE, BLOOD (ROUTINE X 2) w Reflex to ID Panel     Status: None (Preliminary result)   Collection Time: 04/29/20  7:05 PM   Specimen: BLOOD  Result Value Ref Range Status   Specimen Description BLOOD BLOOD RIGHT HAND  Final   Special Requests   Final    BOTTLES DRAWN AEROBIC AND ANAEROBIC Blood Culture adequate volume   Culture   Final    NO GROWTH 3 DAYS Performed at [facility], [address], [place],  [address]    Report Status PENDING  Incomplete  CULTURE, BLOOD (ROUTINE X 2) w Reflex to ID Panel     Status: None (Preliminary result)   Collection Time: 04/29/20  7:06 PM   Specimen: BLOOD  Result Value Ref Range Status   Specimen Description BLOOD RIGHT ANTECUBITAL  Final   Special Requests   Final    BOTTLES DRAWN AEROBIC AND ANAEROBIC Blood Culture results may not be optimal due to an excessive volume of blood received in culture bottles   Culture   Final    NO GROWTH 3 DAYS Performed at [facility], [place] [address],  [place], [place] [address]    Report Status PENDING  Incomplete         Radiology Studies: [place] EKG SITE RITE  Result Date: 05/02/2020 If Site Rite image not attached, placement could not be confirmed due to current cardiac rhythm.       Scheduled Meds: . amLODipine  10 mg Oral Daily  . aspirin  81 mg Oral Daily  . atorvastatin  20 mg Oral q1800  . clopidogrel  75 mg Oral Daily  . enalapril  20 mg Oral BID  . [START ON 05/03/2020] enoxaparin (LOVENOX) injection  0.5 mg/kg Subcutaneous Q24H  . feeding supplement (NEPRO CARB STEADY)  237 mL Oral BID BM  . gabapentin  200 mg Oral BID  . hydrALAZINE  100 mg Oral BID  . hydrochlorothiazide  25 mg Oral Daily  . insulin aspart  0-9 Units Subcutaneous TID WC  . [START ON 05/03/2020] insulin glargine  18 Units Subcutaneous QHS  . levETIRAcetam  500 mg Oral BID  . metoprolol succinate  100  mg Oral Daily  . multivitamin with minerals  1 tablet Oral Daily  . vancomycin variable dose per unstable renal function (pharmacist dosing)   Does not apply See admin instructions   Continuous Infusions:    LOS: 5 days    Time spent: 25 mins     Desma Maxim, MD [facility]  If 7PM-7AM, please contact night-coverage [email or web address] 05/02/2020, 12:21 PM
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
## 2020-05-03 ENCOUNTER — Other Ambulatory Visit: Payer: Self-pay | Admitting: Family Medicine

## 2020-05-03 DIAGNOSIS — R7881 Bacteremia: Secondary | ICD-10-CM | POA: Diagnosis not present

## 2020-05-03 DIAGNOSIS — I10 Essential (primary) hypertension: Secondary | ICD-10-CM

## 2020-05-03 LAB — GLUCOSE, CAPILLARY
Glucose-Capillary: 230 mg/dL — ABNORMAL HIGH (ref 70–99)
Glucose-Capillary: 218 mg/dL — ABNORMAL HIGH (ref 70–99)
Glucose-Capillary: 321 mg/dL — ABNORMAL HIGH (ref 70–99)
Glucose-Capillary: 294 mg/dL — ABNORMAL HIGH (ref 70–99)

## 2020-05-03 LAB — BASIC METABOLIC PANEL
Anion gap: 8 (ref 5–15)
BUN: 38 mg/dL — ABNORMAL HIGH (ref 8–23)
CO2: 25 mmol/L (ref 22–32)
Calcium: 7.9 mg/dL — ABNORMAL LOW (ref 8.9–10.3)
Chloride: 99 mmol/L (ref 98–111)
Creatinine, Ser: 2.14 mg/dL — ABNORMAL HIGH (ref 0.44–1.00)
GFR, Estimated: 24 mL/min — ABNORMAL LOW (ref >60)
Glucose, Bld: 289 mg/dL — ABNORMAL HIGH (ref 70–99)
Potassium: 4 mmol/L (ref 3.5–5.1)
Sodium: 132 mmol/L — ABNORMAL LOW (ref 135–145)

## 2020-05-03 LAB — CBC WITH DIFFERENTIAL/PLATELET
Abs Immature Granulocytes: 0.16 10*3/uL — ABNORMAL HIGH (ref 0.00–0.07)
Basophils Absolute: 0.1 10*3/uL (ref 0.0–0.1)
Basophils Relative: 1 %
Eosinophils Absolute: 0.2 10*3/uL (ref 0.0–0.5)
Eosinophils Relative: 2 %
HCT: 29.2 % — ABNORMAL LOW (ref 36.0–46.0)
Hemoglobin: 9.7 g/dL — ABNORMAL LOW (ref 12.0–15.0)
Immature Granulocytes: 2 %
Lymphocytes Relative: 20 %
Lymphs Abs: 1.8 10*3/uL (ref 0.7–4.0)
MCH: 28 pg (ref 26.0–34.0)
MCHC: 33.2 g/dL (ref 30.0–36.0)
MCV: 84.4 fL (ref 80.0–100.0)
Monocytes Absolute: 1.1 10*3/uL — ABNORMAL HIGH (ref 0.1–1.0)
Monocytes Relative: 12 %
Neutro Abs: 5.8 10*3/uL (ref 1.7–7.7)
Neutrophils Relative %: 63 %
Platelets: 269 10*3/uL (ref 150–400)
RBC: 3.46 MIL/uL — ABNORMAL LOW (ref 3.87–5.11)
RDW: 15 % (ref 11.5–15.5)
WBC: 9.2 10*3/uL (ref 4.0–10.5)
nRBC: 0 % (ref 0.0–0.2)

## 2020-05-03 LAB — CK: Total CK: 25 U/L — ABNORMAL LOW (ref 38–234)

## 2020-05-03 MED ORDER — SODIUM CHLORIDE 0.9 % IV SOLN
8.0000 mg/kg | INTRAVENOUS | Status: DC
  Filled 2020-05-03: qty 14.75

## 2020-05-03 MED ORDER — CHLORHEXIDINE GLUCONATE CLOTH 2 % EX PADS
6.0000 | MEDICATED_PAD | Freq: Every day | CUTANEOUS | Status: DC
  Administered 2020-05-03 – 2020-05-06 (×4): 6 via TOPICAL

## 2020-05-03 MED ORDER — SODIUM CHLORIDE 0.9 % IV SOLN: INTRAVENOUS | Status: DC

## 2020-05-03 MED ORDER — INSULIN ASPART 100 UNIT/ML ~~LOC~~ SOLN
5.0000 [IU] | Freq: Three times a day (TID) | SUBCUTANEOUS | Status: DC
  Administered 2020-05-03 – 2020-05-06 (×5): 5 [IU] via SUBCUTANEOUS
  Filled 2020-05-03 (×7): qty 1

## 2020-05-03 MED ORDER — ENOXAPARIN SODIUM 30 MG/0.3ML ~~LOC~~ SOLN
30.0000 mg | SUBCUTANEOUS | Status: DC
  Administered 2020-05-03: 30 mg via SUBCUTANEOUS
  Filled 2020-05-03: qty 0.3

## 2020-05-03 MED ORDER — SODIUM CHLORIDE 0.9 % IV SOLN: 8.0000 mg/kg | Freq: Every day | INTRAVENOUS | Status: DC

## 2020-05-03 MED ORDER — SODIUM CHLORIDE 0.9% FLUSH
10.0000 mL | INTRAVENOUS | Status: DC | PRN
  Administered 2020-05-07: 20 mL

## 2020-05-03 MED ORDER — INSULIN GLARGINE 100 UNIT/ML ~~LOC~~ SOLN
22.0000 [IU] | Freq: Every day | SUBCUTANEOUS | Status: DC
  Administered 2020-05-03 – 2020-05-08 (×6): 22 [IU] via SUBCUTANEOUS
  Filled 2020-05-03 (×7): qty 0.22

## 2020-05-03 MED ORDER — SODIUM CHLORIDE 0.9% FLUSH
10.0000 mL | Freq: Two times a day (BID) | INTRAVENOUS | Status: DC
  Administered 2020-05-03 – 2020-05-06 (×6): 10 mL

## 2020-05-03 MED ORDER — GABAPENTIN 100 MG PO CAPS
100.0000 mg | ORAL_CAPSULE | Freq: Two times a day (BID) | ORAL | Status: DC
  Administered 2020-05-03: 100 mg via ORAL
  Filled 2020-05-03: qty 1

## 2020-05-03 NOTE — Progress Notes (Signed)
    [facility] has been requested to perform a transesophageal echocardiogram on [name] for bacteremia.  After careful review of history and examination, the risks and benefits of transesophageal echocardiogram have been explained including risks of esophageal damage, perforation (1:10,000 risk), bleeding, pharyngeal hematoma as well as other potential complications associated with conscious sedation including aspiration, arrhythmia, respiratory failure and death. Alternatives to treatment were discussed, questions were answered. Patient is willing to proceed.   Arvil Chaco, PA-C 05/03/2020 1:51 PM

## 2020-05-03 NOTE — Care Management Important Message (Signed)
Important Message  Patient Details  Name: Susan House MRN: [id number] Date of Birth: [birth year]   Medicare Important Message Given:  Yes     Dannette Barbara 05/03/2020, 3:26 PM

## 2020-05-03 NOTE — Progress Notes (Signed)
Physical Therapy Treatment Patient Details Name: Susan House MRN: [id number] DOB: Jul 11, 1956 Today's Date: 05/03/2020    History of Present Illness 64 y.o. female admitted on 04/27/2020 with bacteremia likely secondary to UTI. PMH for HTN, dyslipidemia, CVA and TIA. Patient was started on ceftriaxone and BCx 1/2 sets (both bottles) with GPC S. Aureus and epidermidis, methicillin resistance detected. Interestingly, culture has growing S. Aureus and E. Faecalis. Pharmacy has been consulted for vancomycin dosing.    PT Comments    Patient agrees to PT session. Reports right foot/ankle pain to touch. Moaning throughout mobility this visit. Appears more lethargic this date.  Required mod +2 assist for supine to sit and scooting to edge of bed. Attempted to stand x2 with RW and +2 assist. Patient was unable to clear hips due to weakness and right foot pain. Therefore, patient required total assist via squat pivot to recliner. Required mod to max +2 assist for positioning in recliner. She will continue to benefit from skilled PT while here to improve strength and functional independence.       Follow Up Recommendations  SNF     Equipment Recommendations  None recommended by PT    Recommendations for Other Services       Precautions / Restrictions Precautions Precautions: Fall Restrictions Weight Bearing Restrictions: No    Mobility  Bed Mobility Overal bed mobility: Needs Assistance Bed Mobility: Supine to Sit Rolling: Mod assist   Supine to sit: Mod assist;+2 for physical assistance Sit to supine: Mod assist   General bed mobility comments: Assist with L LEs and trunk support with min cuing for technique and hand placement mod assist to scoot hips forward to edge of bed  Transfers Overall transfer level: Needs assistance Equipment used: None Transfers: Squat Pivot Transfers     Squat pivot transfers: Total assist     General transfer comment: patient attempted to  stand from edge of bed with RW, was unable to clear bottom from bed with +2 assist. Therefore, performed squat pivot to recliner with total assist.  Ambulation/Gait                 Stairs             Wheelchair Mobility    Modified Rankin (Stroke Patients Only)       Balance Overall balance assessment: Needs assistance Sitting-balance support: Feet supported Sitting balance-Leahy Scale: Fair Sitting balance - Comments: min assist for initial sitting Postural control: Posterior lean Standing balance support: During functional activity;Bilateral upper extremity supported Standing balance-Leahy Scale: Zero Standing balance comment: unable to achieve standing this visit                            Cognition Arousal/Alertness: Awake/alert Behavior During Therapy: WFL for tasks assessed/performed Overall Cognitive Status: Within Functional Limits for tasks assessed                                 General Comments: patient seems more lethargic today      Exercises      General Comments        Pertinent Vitals/Pain Pain Assessment: Faces Faces Pain Scale: Hurts whole lot Pain Location: R foot/ankle Pain Descriptors / Indicators: Guarding;Grimacing;Sore;Tender Pain Intervention(s): Limited activity within patient's tolerance;Monitored during session    Home Living  Prior Function            PT Goals (current goals can now be found in the care plan section) Acute Rehab PT Goals Patient Stated Goal: to go to rehab PT Goal Formulation: With patient/family Time For Goal Achievement: 05/07/20 Potential to Achieve Goals: Good Progress towards PT goals: Not progressing toward goals - comment (weak this visit)    Frequency    Min 2X/week      PT Plan Current plan remains appropriate    Co-evaluation              AM-PAC PT "6 Clicks" Mobility   Outcome Measure  Help needed turning from  your back to your side while in a flat bed without using bedrails?: A Lot Help needed moving from lying on your back to sitting on the side of a flat bed without using bedrails?: A Lot Help needed moving to and from a bed to a chair (including a wheelchair)?: Total Help needed standing up from a chair using your arms (e.g., wheelchair or bedside chair)?: Total Help needed to walk in hospital room?: Total Help needed climbing 3-5 steps with a railing? : Total 6 Click Score: 8    End of Session Equipment Utilized During Treatment: Gait belt Activity Tolerance: Patient limited by fatigue Patient left: in chair;with chair alarm set;with family/visitor present;with call bell/phone within reach Nurse Communication: Mobility status;Other (comment) (bed needs changing, pure wick needs replacing) PT Visit Diagnosis: Muscle weakness (generalized) (M62.81);Difficulty in walking, not elsewhere classified (R26.2);History of falling (Z91.81);Unsteadiness on feet (R26.81)     Time: 0881-1031 PT Time Calculation (min) (ACUTE ONLY): 21 min  Charges:  $Therapeutic Activity: 8-22 mins                     Tarri Guilfoil, PT, GCS 05/03/20,2:40 PM

## 2020-05-03 NOTE — Progress Notes (Signed)
Inpatient Diabetes Program Recommendations  AACE/ADA: New Consensus Statement on Inpatient Glycemic Control  Target Ranges:  Prepandial:   less than 140 mg/dL      Peak postprandial:   less than 180 mg/dL (1-2 hours)      Critically ill patients:  140 - 180 mg/dL   Results for [name], Susan House" (MRN [id number]) as of 05/03/2020 08:07  Ref. Range 05/02/2020 07:27 05/02/2020 11:19 05/02/2020 17:23 05/02/2020 21:23 05/03/2020 07:48  Glucose-Capillary Latest Ref Range: 70 - 99 mg/dL 256 (H) 332 (H) 307 (H) 240 (H) 230 (H)   Review of Glycemic Control  Diabetes history: DM2 Outpatient Diabetes medications: Tresiba 30 units daily, Metformin 1000 mg BID Current orders for Inpatient glycemic control: Lantus 18 units QHS, Novolog 0-9 units TID with meals  Inpatient Diabetes Program Recommendations:    Insulin: Please consider ordering Novolog 5 units TID with meals for meal coverage if patient eats at least 50% of meals.  Thanks, Barnie Alderman, RN, MSN, CDE Diabetes Coordinator Inpatient Diabetes Program [phone] (Team Pager from 8am to 5pm)

## 2020-05-03 NOTE — Progress Notes (Signed)
Occupational Therapy Treatment Patient Details Name: Susan House MRN: [id number] DOB: 08-02-1955 Today's Date: 05/03/2020    History of present illness 64 y.o. female admitted on 04/27/2020 with bacteremia likely secondary to UTI. PMH for HTN, dyslipidemia, CVA and TIA. Patient was started on ceftriaxone and BCx 1/2 sets (both bottles) with GPC S. Aureus and epidermidis, methicillin resistance detected. Interestingly, culture has growing S. Aureus and E. Faecalis. Pharmacy has been consulted for vancomycin dosing.   OT comments  Upon entering the room, pt supine in bed with Husband present. PICC line recently placed but with no restrictions for activity. Pt agreeable to OT intervention. Supine >sit with max A for B LEs and trunk support. Pt needing SBA - min A for sitting balance with bathing tasks while seated EOB. Pt tolerated sitting for 18 minutes. She reports feeling dizzy with BP results of 137/72 this session. Pt needing min A for UB self care and returning to supine with mod A for B LEs. Pt washing peri area with set up A and rolling L <> R with min A for therapist to assist with washing buttocks. Pt very fatigued at this point and repositioned in bed to rest. All needs within reach. OT continues to educate pt and caregiver on addressing strength and endurance through functional tasks. All needs within reach.   Follow Up Recommendations  SNF    Equipment Recommendations  None recommended by OT       Precautions / Restrictions Precautions Precautions: Fall      Mobility Bed Mobility Overal bed mobility: Needs Assistance Bed Mobility: Supine to Sit Rolling: Mod assist   Supine to sit: Max assist Sit to supine: Mod assist   General bed mobility comments: Assist with B LEs and trunk support with min cuing for technique and hand placement       Balance Overall balance assessment: Needs assistance Sitting-balance support: Feet supported Sitting balance-Leahy Scale:  Fair Sitting balance - Comments: min assist for initial sitting Postural control: Posterior lean      ADL either performed or assessed with clinical judgement   ADL Overall ADL's : Needs assistance/impaired Eating/Feeding: Set up   Grooming: Wash/dry hands;Wash/dry face;Oral care;Set up;Sitting   Upper Body Bathing: Minimal assistance;Sitting   Lower Body Bathing: Moderate assistance   Upper Body Dressing : Minimal assistance;Sitting      General ADL Comments: Pt sitting EOB for bathing task with  SBA - min A for sitting balance. Pt fatigues easily. Pt returning to supine to wash peri area and assistance to wash buttocks               Cognition Arousal/Alertness: Awake/alert Behavior During Therapy: WFL for tasks assessed/performed Overall Cognitive Status: Within Functional Limits for tasks assessed                         Pertinent Vitals/ Pain       Pain Assessment: No/denies pain     Prior Functioning/Environment              Frequency  Min 1X/week        Progress Toward Goals  OT Goals(current goals can now be found in the care plan section)  Progress towards OT goals: Progressing toward goals  Acute Rehab OT Goals Patient Stated Goal: to go to rehab OT Goal Formulation: With patient/family Time For Goal Achievement: 05/14/20 Potential to Achieve Goals: Good  Plan Discharge plan remains appropriate  AM-PAC OT "6 Clicks" Daily Activity     Outcome Measure   Help from another person eating meals?: A Little Help from another person taking care of personal grooming?: A Little Help from another person toileting, which includes using toliet, bedpan, or urinal?: Total Help from another person bathing (including washing, rinsing, drying)?: A Lot Help from another person to put on and taking off regular upper body clothing?: A Lot Help from another person to put on and taking off regular lower body clothing?: Total 6 Click Score: 12     End of Session    OT Visit Diagnosis: Unsteadiness on feet (R26.81);Repeated falls (R29.6);Muscle weakness (generalized) (M62.81);History of falling (Z91.81)   Activity Tolerance Patient tolerated treatment well   Patient Left in bed;with call bell/phone within reach;with family/visitor present;with bed alarm set   Nurse Communication Mobility status        Time: 6720-9470 OT Time Calculation (min): 39 min  Charges: OT General Charges $OT Visit: 1 Visit OT Treatments $Self Care/Home Management : 38-52 mins  Darleen Crocker, MS, OTR/L , CBIS ascom [phone]  05/03/20, 1:02 PM

## 2020-05-03 NOTE — Progress Notes (Signed)
Pharmacy Antibiotic Note  Susan House is a 64 y.o. female admitted on 04/27/2020 with bacteremia likely secondary to UTI. PMH for HTN, dyslipidemia, CVA and TIA. Patient was started on ceftriaxone and BCx 1/2 sets (both bottles) with E faecalis, MSSA, and multiple CoNS species.  Pharmacy has been consulted for vancomycin dosing.    Today, 05/03/2020 Day #7 vancomycin (last dose 10/15 @ 1624)  Scr worse today (1.65 >> 2.14)  Leukocytosis improved, persistent stable mild left shift on differential, afebrile  Repeat blood cultures remain negative  TTE results neg. Still needs TEE  Source suspected to be urinary and/or skin breakdown in groin and sacral area  Trough 10/16 @ 1437 = 28, supratherapeutic  Trough 10/17 @ 0408 = 24, supratherapeutic  Trough 10/17 @ 1612 = 21, supratherapeutic   Plan:  Patient is clearing vancomycin very slowly. Renal function worse. Will check vancomycin level tomorrow AM. Plan to re-dose Vancomycin when level </= 20 mcg/ml.   Will check Scr with AM labs.  Trough goal = 15-20 mcg/ml    Height: 5\' 3"  (160 cm) Weight: 92.2 kg (203 lb 4.2 oz) IBW/kg (Calculated) : 52.4  Temp (24hrs), Avg:98.5 F (36.9 C), Min:98.4 F (36.9 C), Max:98.6 F (37 C)  Recent Labs  Lab 04/27/20 0055 04/27/20 0055 04/27/20 0343 04/27/20 0522 04/27/20 0610 04/27/20 0919 04/27/20 1821 04/28/20 0547 04/29/20 0535 04/29/20 1134 04/29/20 1500 04/30/20 0457 05/01/20 0534 05/01/20 1437 05/02/20 0408 05/02/20 1612 05/03/20 0318  WBC 11.4*   < >  --   --   --   --   --   --   --  6.3  --  6.6 6.9  --  7.9  --  9.2  CREATININE 1.90*  --   --    < >  --  1.44*  --    < > 1.33*  --   --  1.45* 1.65*  --  1.65*  --  2.14*  LATICACIDVEN 2.1*  --  2.3*  --  4.7* 2.0* 0.8  --   --   --   --   --   --   --   --   --   --   VANCOTROUGH  --   --   --   --   --   --   --   --   --   --    < >  --   --    < > 24* 21*  --    < > = values in this interval not  displayed.    Estimated Creatinine Clearance: 28.6 mL/min (A) (by C-G formula based on SCr of 2.14 mg/dL (H)).    Allergies  Allergen Reactions  . Morphine And Related Anaphylaxis  . Reglan [Metoclopramide]     Other reaction(s): Unknown Elevated BP  . Simvastatin Other (See Comments)    Muscle pain  . Betadine [Povidone Iodine] Rash  . Tetracyclines & Related Rash    Other reaction(s): Unknown    Antimicrobials this admission: 10/12 Ceftriaxone >> 10/13 10/12 Vancomycin >>   Microbiology results: 10/12 BCx: GPC 1/2 sets (both bottles) MSSA, E faecalis cultured 10/14 Bcx NGTD 10/12 UCx: multiple species  Thank you for allowing pharmacy to be a part of this patient's care.  Benita Gutter 05/03/2020 11:59 AM

## 2020-05-03 NOTE — Progress Notes (Signed)
Pharmacy Antibiotic Note  Susan House is a 64 y.o. female admitted on 04/27/2020 with bacteremia likely secondary to UTI. PMH for HTN, dyslipidemia, CVA and TIA. Patient was started on ceftriaxone and BCx 1/2 sets (both bottles) with E faecalis, MSSA, and multiple CoNS species.  Pharmacy has been consulted for vancomycin dosing.    Today, 05/03/2020 Day #7 vancomycin (last dose 10/15 @ 1624)  Scr worse today (1.65 >> 2.14)  Leukocytosis improved, persistent stable mild left shift on differential, afebrile  Repeat blood cultures remain negative  TTE results neg. Still needs TEE  Source suspected to be urinary and/or skin breakdown in groin and sacral area  Trough 10/16 @ 1437 = 28, supratherapeutic  Trough 10/17 @ 0408 = 24, supratherapeutic  Trough 10/17 @ 1612 = 21, supratherapeutic   Plan:  Switching to daptomycin. Vancomycin level on 10/17 was supratherapeutic and not much improvement with renal function. Patient is clearing vancomycin slowly and will probably be within goal range tomorrow. Will schedule daptomycin 8 mg/kg q48H for tomorrow @2000  baseline on renal function. Baseline CK ordered. Monitor CK at least weekly.    Height: 5\' 3"  (160 cm) Weight: 92.2 kg (203 lb 4.2 oz) IBW/kg (Calculated) : 52.4  Temp (24hrs), Avg:98.5 F (36.9 C), Min:98.4 F (36.9 C), Max:98.6 F (37 C)  Recent Labs  Lab 04/27/20 0055 04/27/20 0055 04/27/20 0343 04/27/20 0522 04/27/20 0610 04/27/20 0919 04/27/20 1821 04/28/20 0547 04/29/20 0535 04/29/20 1134 04/29/20 1500 04/30/20 0457 05/01/20 0534 05/01/20 1437 05/02/20 0408 05/02/20 1612 05/03/20 0318  WBC 11.4*   < >  --   --   --   --   --   --   --  6.3  --  6.6 6.9  --  7.9  --  9.2  CREATININE 1.90*  --   --    < >  --  1.44*  --    < > 1.33*  --   --  1.45* 1.65*  --  1.65*  --  2.14*  LATICACIDVEN 2.1*  --  2.3*  --  4.7* 2.0* 0.8  --   --   --   --   --   --   --   --   --   --   VANCOTROUGH  --   --   --    --   --   --   --   --   --   --    < >  --   --    < > 24* 21*  --    < > = values in this interval not displayed.    Estimated Creatinine Clearance: 28.6 mL/min (A) (by C-G formula based on SCr of 2.14 mg/dL (H)).    Allergies  Allergen Reactions  . Morphine And Related Anaphylaxis  . Reglan [Metoclopramide]     Other reaction(s): Unknown Elevated BP  . Simvastatin Other (See Comments)    Muscle pain  . Betadine [Povidone Iodine] Rash  . Tetracyclines & Related Rash    Other reaction(s): Unknown    Antimicrobials this admission: 10/12 Ceftriaxone >> 10/13 10/12 Vancomycin >> 10/18 10/19 daptomycin >>  Microbiology results: 10/12 BCx: GPC 1/2 sets (both bottles) MSSA, E faecalis cultured 10/14 Bcx NGTD 10/12 UCx: multiple species  Thank you for allowing pharmacy to be a part of this patient's care.  Oswald Hillock 05/03/2020 9:27 PM

## 2020-05-03 NOTE — Progress Notes (Addendum)
PROGRESS NOTE    Susan House  KVQ:[id number] DOB: 04-Jul-1956 DOA: 04/27/2020 PCP: Jerrol Banana., MD   Susan House  is a 64 y.o. Caucasian female with a known history of multiple medical problems that are mentioned below including hypertension, dyslipidemia, CVA and TIA, who presented to the emergency room with acute onset of altered mental status.  She has been having decreased energy level and vomiting at home.  She is a very poor historian and has not been able to provide any meaningful history.  She was tachycardic and tachypneic upon arrival.  She was having extensive fecal material around her and several bruises.  No reported chest pain or palpitations.  No reported cough or wheezing or dyspnea.  It is unclear if she has been having urinary symptoms including dysuria or frequency but she answers yes to all questions.  Upon presentation to the emergency room blood pressure was 167/92 with a heart rate of 126 and respiratory to 26 with pulse symmetry of 98% on room air.  Labs revealed ABG with pH 7.37 and HCO3 of 12.7 and BMP revealed a CO2 of 14 with a blood glucose of 448 and a creatinine of 1.9 with anion gap of 23 magnesium of 1.3 and total bili of 2.1 with lactic acid of 2.1 later 2.3 and CBC showed leukocytosis 11.4 with relative neutrophilia.  Beta hydroxybutyrate was 7.11.  COVID-19 PCR and influenza antigens came back negative.  UA showed more than 50 WBCs more than 300 protein and 20 ketones and more than 500 glucose.  The patient was given 2 g of IV magnesium sulfate, total bolus of IV lactated Ringer followed by 125 mL/h; of IV potassium chloride.  She will be admitted to a stepdown unit for further evaluation and management.     Assessment & Plan:   Active Problems:   DKA (diabetic ketoacidosis) ([place])   Pressure injury of skin   DKA: secondary to poorly controlled DM2 and bloodstream infection. Resolved. Glucose today 200s. Off insulin gtt and IV  fluids - increase lantus from 18 to 22 qhs, continue SSI, add novolog 5 w/ meals - cont carb modified diet - appreciate dm coordinator input  Bacteremia  Sepsis:  presented w/ tachycardia, tachypnea & bacteremia & possible UTI. Has spinal hardware, no tenderness at site. Hemodynamically stable currently. Blood cultures growing s aureus, second set obtained 10/14 ngtd - appreciate ID recs - continue vancomycin - TTE neg for vegetations, ID advising  - cardiology has seen, plan for TEE tomorrow morning - picc placed 10/18 - plan on d/c to SNF, has authorization and a bed  Hypokalemia - k normal today - monitor  Normocytic anemia - baseline hgb 11s, here has been trending 9-10. No melena or hematochezia or other bleeding - monitor  Encephalopathy: likely metabolic & secondary to above. Patient has improved. CT head neg for acute intracranial findings, but atrophy & chronic infarcts present.  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 Has appetite, no fevers, no pain.   Objective: Vitals:   05/03/20 0004 05/03/20 0551 05/03/20 0748  05/03/20 1157  BP: (!) 184/72 (!) 145/77 (!) 173/67 (!) 167/65  Pulse: 84 88 87 76  Resp: 15  18 17   Temp: 98.4 F (36.9 C) 98.5 F (36.9 C) 98.6 F (37 C) 98.4 F (36.9 C)  TempSrc: Oral Oral Oral Oral  SpO2: 97% 99% 99% 100%  Weight:      Height:        Intake/Output Summary (Last 24 hours) at 05/03/2020 1237 Last data filed at 05/03/2020 1159 Gross per 24 hour  Intake 0 ml  Output 3850 ml  Net -3850 ml   Filed Weights   04/30/20 0406 05/01/20 0426 05/02/20 0418  Weight: 96.2 kg 99.2 kg 92.2 kg    Examination:  General exam: Alert, pleasant. Morbidly obese Respiratory system: diminished breath sounds b/l  Cardiovascular system: S1 & S2 +. No ubs, gallops or clicks. Trace pedal edema Gastrointestinal system: Abdomen is obese, soft and nontender. Hypoactive bowel sounds Central nervous system: Awake, alert, moving all 4 extremities.  Psychiatry: aao x3 today    Data Reviewed: I have personally reviewed following labs and imaging studies  CBC: Recent Labs  Lab 04/29/20 1134 04/30/20 0457 05/01/20 0534 05/02/20 0408 05/03/20 0318  WBC 6.3 6.6 6.9 7.9 9.2  NEUTROABS 4.2 3.3 3.3 4.3 5.8  HGB 9.7* 10.0* 9.3* 9.4* 9.7*  HCT 30.0* 30.3* 28.3* 28.2* 29.2*  MCV 86.2 85.4 84.2 83.2 84.4  PLT 242 224 203 235 758   Basic Metabolic Panel: Recent Labs  Lab 04/27/20 0055 04/27/20 0055 04/27/20 0522 04/27/20 0919 04/28/20 1253 04/28/20 1253 04/29/20 0535 04/30/20 0457 05/01/20 0534 05/02/20 0408 05/03/20 0318  NA 142   < > 143   < > 143   < > 140 136 133* 132* 132*  K 3.6   < > 3.5   < > 3.1*   < > 4.6 4.0 4.0 4.1 4.0  CL 105   < > 109   < > 107   < > 111 107 103 102 99  CO2 14*   < > 15*   < > 21*   < > 20* 19* 21* 23 25  GLUCOSE 448*   < > 182*   < > 195*   < > 169* 173* 228* 290* 289*  BUN 22   < > 20   < > 15   < > 15 19 25* 33* 38*  CREATININE 1.90*   < > 1.71*   < > 1.37*   < > 1.33* 1.45* 1.65* 1.65* 2.14*  CALCIUM 8.6*   < > 8.1*   < > 8.2*   < >  7.9* 7.9* 8.2* 7.9* 7.9*  MG 1.3*  --  1.4*  --  1.4*  --  1.9  --   --   --   --    < > = values in this interval not displayed.   GFR: Estimated Creatinine Clearance: 28.6 mL/min (A) (by C-G formula based on SCr of 2.14 mg/dL (H)). Liver  Function Tests: Recent Labs  Lab 04/27/20 0055 04/28/20 1253  AST 24 27  ALT 14 13  ALKPHOS 67 55  BILITOT 2.1* 1.0  PROT 7.4 6.0*  ALBUMIN 3.5 2.9*   No results for input(s): LIPASE, AMYLASE in the last 168 hours. No results for input(s): AMMONIA in the last 168 hours. Coagulation Profile: Recent Labs  Lab 04/27/20 0055  INR 1.0   Cardiac Enzymes: No results for input(s): CKTOTAL, CKMB, CKMBINDEX, TROPONINI in the last 168 hours. BNP (last 3 results) No results for input(s): PROBNP in the last 8760 hours. HbA1C: No results for input(s): HGBA1C in the last 72 hours. CBG: Recent Labs  Lab 05/02/20 1119 05/02/20 1723 05/02/20 2123 05/03/20 0748 05/03/20 1156  GLUCAP 332* 307* 240* 230* 218*   Lipid Profile: No results for input(s): CHOL, HDL, LDLCALC, TRIG, CHOLHDL, LDLDIRECT in the last 72 hours. Thyroid Function Tests: No results for input(s): TSH, T4TOTAL, FREET4, T3FREE, THYROIDAB in the last 72 hours. Anemia Panel: No results for input(s): VITAMINB12, FOLATE, FERRITIN, TIBC, IRON, RETICCTPCT in the last 72 hours. Sepsis Labs: Recent Labs  Lab 04/27/20 0343 04/27/20 0610 04/27/20 0919 04/27/20 1821  LATICACIDVEN 2.3* 4.7* 2.0* 0.8    Recent Results (from the past 240 hour(s))  Blood Culture (routine x 2)     Status: Abnormal   Collection Time: 04/27/20 12:55 AM   Specimen: BLOOD  Result Value Ref Range Status   Specimen Description   Final    BLOOD LEFT [facility] Performed at [facility], [address], [place], [place] [address]    Special Requests   Final    BOTTLES DRAWN AEROBIC AND ANAEROBIC Blood Culture results may not be optimal due to an excessive volume of blood received in culture  bottles Performed at [facility], [address], [place], [place] [address]    Culture  Setup Time   Final    GRAM POSITIVE COCCI IN BOTH AEROBIC AND ANAEROBIC BOTTLES CRITICAL RESULT CALLED TO, READ BACK BY AND VERIFIED WITH: SUSAN WATSON AT 6948 ON 04/27/2020 [place].    Culture (A)  Final    STAPHYLOCOCCUS AUREUS ENTEROCOCCUS FAECALIS STAPHYLOCOCCUS SIMULANS STAPHYLOCOCCUS HOMINIS STAPHYLOCOCCUS EPIDERMIDIS ID BY BCID UNABLE TO RECOVER FROM CULTURE THE SIGNIFICANCE OF ISOLATING THIS ORGANISM FROM A SINGLE SET OF BLOOD CULTURES WHEN MULTIPLE SETS ARE DRAWN IS UNCERTAIN. PLEASE NOTIFY THE MICROBIOLOGY DEPARTMENT WITHIN ONE WEEK IF SPECIATION AND SENSITIVITIES ARE REQUIRED. Performed at [facility], [place] [address] [place], [place] [address]    Report Status 05/02/2020 FINAL  Final   Organism ID, Bacteria STAPHYLOCOCCUS AUREUS  Final   Organism ID, Bacteria ENTEROCOCCUS FAECALIS  Final      Susceptibility   Enterococcus faecalis - MIC*    AMPICILLIN <=2 SENSITIVE Sensitive     VANCOMYCIN 1 SENSITIVE Sensitive     GENTAMICIN SYNERGY SENSITIVE Sensitive     * ENTEROCOCCUS FAECALIS   Staphylococcus aureus - MIC*    CIPROFLOXACIN >=8 RESISTANT Resistant     ERYTHROMYCIN >=8 RESISTANT Resistant     GENTAMICIN <=0.5 SENSITIVE Sensitive     OXACILLIN <=0.25 SENSITIVE Sensitive     TETRACYCLINE <=1 SENSITIVE Sensitive     VANCOMYCIN <=0.5 SENSITIVE Sensitive     TRIMETH/SULFA <=10 SENSITIVE Sensitive     CLINDAMYCIN RESISTANT Resistant     RIFAMPIN <=0.5 SENSITIVE Sensitive     Inducible Clindamycin POSITIVE Resistant     * STAPHYLOCOCCUS AUREUS  Blood Culture (routine x 2)  Status: None   Collection Time: 04/27/20 12:55 AM   Specimen: BLOOD  Result Value Ref Range Status   Specimen Description BLOOD RIGTH HAND  Final   Special Requests   Final    BOTTLES DRAWN AEROBIC AND ANAEROBIC Blood Culture adequate volume   Culture   Final    NO GROWTH 5  DAYS Performed at [facility], [address], [place], [place] [address]    Report Status 05/02/2020 FINAL  Final  Respiratory Panel by RT PCR (Flu A&B, Covid) - Nasopharyngeal Swab     Status: None   Collection Time: 04/27/20 12:55 AM   Specimen: Nasopharyngeal Swab  Result Value Ref Range Status   SARS Coronavirus 2 by RT PCR NEGATIVE NEGATIVE Final    Comment: (NOTE) SARS-CoV-2 target nucleic acids are NOT DETECTED.  The SARS-CoV-2 RNA is generally detectable in upper respiratoy specimens during the acute phase of infection. The lowest concentration of SARS-CoV-2 viral copies this assay can detect is 131 copies/mL. A negative result does not preclude SARS-Cov-2 infection and should not be used as the sole basis for treatment or other patient management decisions. A negative result may occur with  improper specimen collection/handling, submission of specimen other than nasopharyngeal swab, presence of viral mutation(s) within the areas targeted by this assay, and inadequate number of viral copies (<131 copies/mL). A negative result must be combined with clinical observations, patient history, and epidemiological information. The expected result is Negative.  Fact Sheet for Patients:  PinkCheek.be  Fact Sheet for Healthcare Providers:  GravelBags.it  This test is no t yet approved or cleared by the [place] FDA and  has been authorized for detection and/or diagnosis of SARS-CoV-2 by FDA under an Emergency Use Authorization (EUA). This EUA will remain  in effect (meaning this test can be used) for the duration of the COVID-19 declaration under Section 564(b)(1) of the Act, 21 U.S.C. section 360bbb-3(b)(1), unless the authorization is terminated or revoked sooner.     Influenza A by PCR NEGATIVE NEGATIVE Final   Influenza B by PCR NEGATIVE NEGATIVE Final    Comment: (NOTE) The Xpert Xpress  SARS-CoV-2/FLU/RSV assay is intended as an aid in  the diagnosis of influenza from Nasopharyngeal swab specimens and  should not be used as a sole basis for treatment. Nasal washings and  aspirates are unacceptable for Xpert Xpress SARS-CoV-2/FLU/RSV  testing.  Fact Sheet for Patients: PinkCheek.be  Fact Sheet for Healthcare Providers: GravelBags.it  This test is not yet approved or cleared by the [place] FDA and  has been authorized for detection and/or diagnosis of SARS-CoV-2 by  FDA under an Emergency Use Authorization (EUA). This EUA will remain  in effect (meaning this test can be used) for the duration of the  Covid-19 declaration under Section 564(b)(1) of the Act, 21  U.S.C. section 360bbb-3(b)(1), unless the authorization is  terminated or revoked. Performed at [facility], [address] [place], [place] [address]   Blood Culture ID Panel (Reflexed)     Status: Abnormal   Collection Time: 04/27/20 12:55 AM  Result Value Ref Range Status   Enterococcus faecalis NOT DETECTED NOT DETECTED Final   Enterococcus Faecium NOT DETECTED NOT DETECTED Final   Listeria monocytogenes NOT DETECTED NOT DETECTED Final   Staphylococcus species DETECTED (A) NOT DETECTED Final    Comment: CRITICAL RESULT CALLED TO, READ BACK BY AND VERIFIED WITH: SUSAN WATSON AT 1424 ON 04/27/2020 [place].    Staphylococcus aureus (BCID) DETECTED (A) NOT  DETECTED Final    Comment: CRITICAL RESULT CALLED TO, READ BACK BY AND VERIFIED WITH: SUSAN WATSON AT 1424 ON 04/27/2020 [place].    Staphylococcus epidermidis DETECTED (A) NOT DETECTED Final    Comment: CRITICAL RESULT CALLED TO, READ BACK BY AND VERIFIED WITH: SUSAN WATSON AT 1424 ON 04/27/2020 [place].    Staphylococcus lugdunensis NOT DETECTED NOT DETECTED Final   Streptococcus species NOT DETECTED NOT DETECTED Final   Streptococcus agalactiae NOT DETECTED NOT DETECTED Final    Streptococcus pneumoniae NOT DETECTED NOT DETECTED Final   Streptococcus pyogenes NOT DETECTED NOT DETECTED Final   A.calcoaceticus-baumannii NOT DETECTED NOT DETECTED Final   Bacteroides fragilis NOT DETECTED NOT DETECTED Final   Enterobacterales NOT DETECTED NOT DETECTED Final   Enterobacter cloacae complex NOT DETECTED NOT DETECTED Final   Escherichia coli NOT DETECTED NOT DETECTED Final   Klebsiella aerogenes NOT DETECTED NOT DETECTED Final   Klebsiella oxytoca NOT DETECTED NOT DETECTED Final   Klebsiella pneumoniae NOT DETECTED NOT DETECTED Final   Proteus species NOT DETECTED NOT DETECTED Final   Salmonella species NOT DETECTED NOT DETECTED Final   Serratia marcescens NOT DETECTED NOT DETECTED Final   Haemophilus influenzae NOT DETECTED NOT DETECTED Final   Neisseria meningitidis NOT DETECTED NOT DETECTED Final   Pseudomonas aeruginosa NOT DETECTED NOT DETECTED Final   Stenotrophomonas maltophilia NOT DETECTED NOT DETECTED Final   Candida albicans NOT DETECTED NOT DETECTED Final   Candida auris NOT DETECTED NOT DETECTED Final   Candida glabrata NOT DETECTED NOT DETECTED Final   Candida krusei NOT DETECTED NOT DETECTED Final   Candida parapsilosis NOT DETECTED NOT DETECTED Final   Candida tropicalis NOT DETECTED NOT DETECTED Final   Cryptococcus neoformans/gattii NOT DETECTED NOT DETECTED Final   Methicillin resistance mecA/C DETECTED (A) NOT DETECTED Final    Comment: CRITICAL RESULT CALLED TO, READ BACK BY AND VERIFIED WITH: SUSAN WATSON AT 1424 ON 04/27/2020 [place].    Meth resistant mecA/C and MREJ NOT DETECTED NOT DETECTED Final    Comment: Performed at [facility], [address] [place], [place] [address]  Urine culture     Status: Abnormal   Collection Time: 04/27/20  1:57 AM   Specimen: In/Out Cath Urine  Result Value Ref Range Status   Specimen Description   Final    IN/OUT CATH URINE Performed at [facility], [address] [place], [place]  [address]    Special Requests   Final    NONE Performed at [facility], [address] [place], [place] [address]    Culture MULTIPLE SPECIES PRESENT, SUGGEST RECOLLECTION (A)  Final   Report Status 04/27/2020 FINAL  Final  CULTURE, BLOOD (ROUTINE X 2) w Reflex to ID Panel     Status: None (Preliminary result)   Collection Time: 04/29/20  7:05 PM   Specimen: BLOOD  Result Value Ref Range Status   Specimen Description BLOOD BLOOD RIGHT HAND  Final   Special Requests   Final    BOTTLES DRAWN AEROBIC AND ANAEROBIC Blood Culture adequate volume   Culture   Final    NO GROWTH 4 DAYS Performed at [facility], [address], [place], [place] [address]    Report Status PENDING  Incomplete  CULTURE, BLOOD (ROUTINE X 2) w Reflex to ID Panel     Status: None (Preliminary result)   Collection Time: 04/29/20  7:06 PM   Specimen: BLOOD  Result Value Ref Range Status   Specimen Description BLOOD RIGHT ANTECUBITAL  Final   Special Requests   Final    BOTTLES DRAWN AEROBIC AND ANAEROBIC Blood Culture results may not be optimal due to an excessive volume of blood received in culture bottles   Culture   Final    NO GROWTH 4 DAYS Performed at [facility], [address], [place], [place] [address]    Report Status PENDING  Incomplete         Radiology Studies: [place] EKG SITE RITE  Result Date: 05/02/2020 If Site Rite image not attached, placement could not be confirmed due to current cardiac rhythm.       Scheduled Meds: . amLODipine  10 mg Oral Daily  . aspirin  81 mg Oral Daily  . atorvastatin  20 mg Oral q1800  . Chlorhexidine Gluconate Cloth  6 each Topical Daily  . clopidogrel  75 mg Oral Daily  . enoxaparin (LOVENOX) injection  30 mg Subcutaneous Q24H  . feeding supplement (NEPRO CARB STEADY)  237 mL Oral BID BM  . gabapentin  100 mg Oral BID  . hydrALAZINE  100 mg Oral BID  . insulin aspart  0-9 Units Subcutaneous TID WC  . insulin aspart  5  Units Subcutaneous TID WC  . insulin glargine  22 Units Subcutaneous QHS  . levETIRAcetam  500 mg Oral BID  . metoprolol succinate  100 mg Oral Daily  . multivitamin with minerals  1 tablet Oral Daily  . sodium chloride flush  10-40 mL Intracatheter Q12H  . vancomycin variable dose per unstable renal function (pharmacist dosing)   Does not apply See admin instructions   Continuous Infusions:    LOS: 6 days    Time spent: 25 mins     Desma Maxim, MD [facility]  If 7PM-7AM, please contact night-coverage [email or web address] 05/03/2020, 12:37 PM

## 2020-05-03 NOTE — Progress Notes (Signed)
Peripherally Inserted Central Catheter Placement  The IV Nurse has discussed with the patient and/or persons authorized to consent for the patient, the purpose of this procedure and the potential benefits and risks involved with this procedure.  The benefits include less needle sticks, lab draws from the catheter, and the patient may be discharged home with the catheter. Risks include, but not limited to, infection, bleeding, blood clot (thrombus formation), and puncture of an artery; nerve damage and irregular heartbeat and possibility to perform a PICC exchange if needed/ordered by physician.  Alternatives to this procedure were also discussed.  Bard Power PICC patient education guide, fact sheet on infection prevention and patient information card has been provided to patient /or left at bedside.   Consent signed by patient and daughter, Rudy Jew, via phone.   PICC Placement Documentation  PICC Single Lumen 05/03/20 PICC Right Brachial 40 cm 0 cm (Active)  Indication for Insertion or Continuance of Line Home intravenous therapies (PICC only) 05/03/20 1000  Exposed Catheter (cm) 0 cm 05/03/20 1000  Site Assessment Clean;Dry;Intact 05/03/20 1000  Line Status Flushed;Saline locked;Blood return noted 05/03/20 1000  Dressing Type Transparent;Securing device 05/03/20 1000  Dressing Status Clean;Dry;Intact 05/03/20 1000  Antimicrobial disc in place? Yes 05/03/20 1000  Line Care Connections checked and tightened 05/03/20 1000  Dressing Change Due 05/10/20 05/03/20 1000       Holley Bouche Renee 05/03/2020, 10:54 AM

## 2020-05-03 NOTE — Progress Notes (Signed)
PHARMACIST - PHYSICIAN COMMUNICATION  CONCERNING:  Enoxaparin (Lovenox) for DVT Prophylaxis    RECOMMENDATION: Patient was prescribed enoxaparin 45 mg q24 hours (0.5 mg/kg/day) for VTE prophylaxis. Scr increased today.   Filed Weights   04/30/20 0406 05/01/20 0426 05/02/20 0418  Weight: 96.2 kg (212 lb 1.6 oz) 99.2 kg (218 lb 11.1 oz) 92.2 kg (203 lb 4.2 oz)    Body mass index is 36.01 kg/m.  Estimated Creatinine Clearance: 28.6 mL/min (A) (by C-G formula based on SCr of 2.14 mg/dL (H)).   Patient is candidate for enoxaparin 30mg  every 24 hours based on CrCl <57ml/min or Weight <45kg  DESCRIPTION: Pharmacy has adjusted enoxaparin dose per [facility] policy.  Patient is now receiving enoxaparin 30 mg every 24 hours    Benita Gutter 05/03/2020 8:31 AM

## 2020-05-03 NOTE — Progress Notes (Signed)
ID  she was throwing up on Sunday 04/25/20 and it got worse on monday , she was confused and she fell husband  called EMS on Monday night Since jan 2020 pt has had multiple falls- with contusions, fracture ribs , fracture of toe, and on another occasion in April 2021  fracture of ulna styloid.  Patient Vitals for the past 24 hrs:  BP Temp Temp src Pulse Resp SpO2  05/03/20 1157 (!) 167/65 98.4 F (36.9 C) Oral 76 17 100 %  05/03/20 0748 (!) 173/67 98.6 F (37 C) Oral 87 18 99 %  05/03/20 0551 (!) 145/77 98.5 F (36.9 C) Oral 88 -- 99 %  05/03/20 0004 (!) 184/72 98.4 F (36.9 C) Oral 84 15 97 %  05/02/20 2011 (!) 173/81 98.4 F (36.9 C) Oral 78 18 98 %  05/02/20 1637 (!) 161/66 98.5 F (36.9 C) Oral 81 18 98 %   Pt has been confused today  Says she feels weak No cough, no sob, no chest pain, no headache Husband at bed side    O/e Awake, alert Oriented in place and person Chest b/l air entry [id number] abd soft Left hemiparesis ? Left shoulder dislocation   CBC Latest Ref Rng & Units 05/03/2020 05/02/2020 05/01/2020  WBC 4.0 - 10.5 K/uL 9.2 7.9 6.9  Hemoglobin 12.0 - 15.0 g/dL 9.7(L) 9.4(L) 9.3(L)  Hematocrit 36 - 46 % 29.2(L) 28.2(L) 28.3(L)  Platelets 150 - 400 K/uL 269 235 203    CMP Latest Ref Rng & Units 05/03/2020 05/02/2020 05/01/2020  Glucose 70 - 99 mg/dL 289(H) 290(H) 228(H)  BUN 8 - 23 mg/dL 38(H) 33(H) 25(H)  Creatinine 0.44 - 1.00 mg/dL 2.14(H) 1.65(H) 1.65(H)  Sodium 135 - 145 mmol/L 132(L) 132(L) 133(L)  Potassium 3.5 - 5.1 mmol/L 4.0 4.1 4.0  Chloride 98 - 111 mmol/L 99 102 103  CO2 22 - 32 mmol/L 25 23 21(L)  Calcium 8.9 - 10.3 mg/dL 7.9(L) 7.9(L) 8.2(L)  Total Protein 6.5 - 8.1 g/dL - - -  Total Bilirubin 0.3 - 1.2 mg/dL - - -  Alkaline Phos 38 - 126 U/L - - -  AST 15 - 41 U/L - - -  ALT 0 - 44 U/L - - -     Impression/recommendation  ?Altered mental status-fluctuating  has underlying CVA  DKA - treated with fluids, insulin drip  Poly  microbial bacteremia ( 4 diffent organisms- MSSA ( not MRSA as reported before) Staph simulans, staph hominis staph epi, enterococcus fecalis from the left cubital fossa Pathogenic organisms ( 2) + skin contaminants ( 3)  -- source currently not clear This could very well be contaminant ( but  staph aureus cannot be considered a contaminant)    repeat  blood culture neg With a calcified mitral valve, CVA , AMS will get TEE to look for valve vegetation/PFO Will decide on duration post TEE  AKI on CKD- D?C vanco- switch to dapto for now  She has fusion hardware in her spine but site okay    UTI is questioned as urine analysis >50 WBC  post void bladder scan- no urine  CVA- many old infarcts Also has left hemiparesis   Discussed the management with patient, her husband and care team

## 2020-05-04 ENCOUNTER — Inpatient Hospital Stay: Payer: Medicare Other

## 2020-05-04 ENCOUNTER — Encounter
Admission: EM | Disposition: A | Discharge status: Skilled Nursing Facility | Payer: Self-pay | Source: Home / Self Care | Attending: Obstetrics and Gynecology

## 2020-05-04 ENCOUNTER — Inpatient Hospital Stay: Admit: 2020-05-04 | Payer: Medicare Other

## 2020-05-04 DIAGNOSIS — E1110 Type 2 diabetes mellitus with ketoacidosis without coma: Secondary | ICD-10-CM | POA: Diagnosis not present

## 2020-05-04 DIAGNOSIS — I639 Cerebral infarction, unspecified: Secondary | ICD-10-CM

## 2020-05-04 DIAGNOSIS — R410 Disorientation, unspecified: Secondary | ICD-10-CM | POA: Diagnosis not present

## 2020-05-04 DIAGNOSIS — R7881 Bacteremia: Secondary | ICD-10-CM | POA: Diagnosis not present

## 2020-05-04 LAB — URINALYSIS, COMPLETE (UACMP) WITH MICROSCOPIC
Bacteria, UA: NONE SEEN
Bilirubin Urine: NEGATIVE
Glucose, UA: 50 mg/dL — AB
Ketones, ur: NEGATIVE mg/dL
Nitrite: NEGATIVE
Protein, ur: >=300 mg/dL — AB
Specific Gravity, Urine: 1.009 (ref 1.005–1.030)
WBC, UA: >50 WBC/hpf — ABNORMAL HIGH (ref 0–5)
pH: 5 (ref 5.0–8.0)

## 2020-05-04 LAB — BASIC METABOLIC PANEL
Anion gap: 9 (ref 5–15)
BUN: 41 mg/dL — ABNORMAL HIGH (ref 8–23)
CO2: 23 mmol/L (ref 22–32)
Calcium: 8.2 mg/dL — ABNORMAL LOW (ref 8.9–10.3)
Chloride: 101 mmol/L (ref 98–111)
Creatinine, Ser: 1.96 mg/dL — ABNORMAL HIGH (ref 0.44–1.00)
GFR, Estimated: 26 mL/min — ABNORMAL LOW (ref >60)
Glucose, Bld: 193 mg/dL — ABNORMAL HIGH (ref 70–99)
Potassium: 4 mmol/L (ref 3.5–5.1)
Sodium: 133 mmol/L — ABNORMAL LOW (ref 135–145)

## 2020-05-04 LAB — HEPATIC FUNCTION PANEL
ALT: 17 U/L (ref 0–44)
AST: 24 U/L (ref 15–41)
Albumin: 2.8 g/dL — ABNORMAL LOW (ref 3.5–5.0)
Alkaline Phosphatase: 50 U/L (ref 38–126)
Bilirubin, Direct: <0.1 mg/dL (ref 0.0–0.2)
Total Bilirubin: 0.7 mg/dL (ref 0.3–1.2)
Total Protein: 6 g/dL — ABNORMAL LOW (ref 6.5–8.1)

## 2020-05-04 LAB — BLOOD GAS, ARTERIAL
Acid-Base Excess: 0.6 mmol/L (ref 0.0–2.0)
Bicarbonate: 23.8 mmol/L (ref 20.0–28.0)
FIO2: 21
O2 Saturation: 96.7 %
Patient temperature: 37
pCO2 arterial: 32 mmHg (ref 32.0–48.0)
pH, Arterial: 7.48 — ABNORMAL HIGH (ref 7.350–7.450)
pO2, Arterial: 81 mmHg — ABNORMAL LOW (ref 83.0–108.0)

## 2020-05-04 LAB — CBC
HCT: 25.2 % — ABNORMAL LOW (ref 36.0–46.0)
HCT: 25.4 % — ABNORMAL LOW (ref 36.0–46.0)
Hemoglobin: 8.3 g/dL — ABNORMAL LOW (ref 12.0–15.0)
Hemoglobin: 8.4 g/dL — ABNORMAL LOW (ref 12.0–15.0)
MCH: 27.8 pg (ref 26.0–34.0)
MCH: 27.8 pg (ref 26.0–34.0)
MCHC: 32.9 g/dL (ref 30.0–36.0)
MCHC: 33.1 g/dL (ref 30.0–36.0)
MCV: 84.3 fL (ref 80.0–100.0)
MCV: 84.1 fL (ref 80.0–100.0)
Platelets: 287 10*3/uL (ref 150–400)
Platelets: 302 10*3/uL (ref 150–400)
RBC: 2.99 MIL/uL — ABNORMAL LOW (ref 3.87–5.11)
RBC: 3.02 MIL/uL — ABNORMAL LOW (ref 3.87–5.11)
RDW: 15.4 % (ref 11.5–15.5)
RDW: 15.5 % (ref 11.5–15.5)
WBC: 10.7 10*3/uL — ABNORMAL HIGH (ref 4.0–10.5)
WBC: 9.9 10*3/uL (ref 4.0–10.5)
nRBC: 0 % (ref 0.0–0.2)
nRBC: 0 % (ref 0.0–0.2)

## 2020-05-04 LAB — CULTURE, BLOOD (ROUTINE X 2)
Culture: NO GROWTH
Culture: NO GROWTH
Special Requests: ADEQUATE

## 2020-05-04 LAB — AMMONIA: Ammonia: 10 umol/L (ref 9–35)

## 2020-05-04 LAB — GLUCOSE, CAPILLARY
Glucose-Capillary: 170 mg/dL — ABNORMAL HIGH (ref 70–99)
Glucose-Capillary: 178 mg/dL — ABNORMAL HIGH (ref 70–99)
Glucose-Capillary: 143 mg/dL — ABNORMAL HIGH (ref 70–99)
Glucose-Capillary: 189 mg/dL — ABNORMAL HIGH (ref 70–99)

## 2020-05-04 LAB — VITAMIN B12: Vitamin B-12: 232 pg/mL (ref 180–914)

## 2020-05-04 LAB — TROPONIN I (HIGH SENSITIVITY)
Troponin I (High Sensitivity): 26 ng/L — ABNORMAL HIGH (ref <18)
Troponin I (High Sensitivity): 27 ng/L — ABNORMAL HIGH (ref <18)

## 2020-05-04 LAB — LACTIC ACID, PLASMA: Lactic Acid, Venous: 0.7 mmol/L (ref 0.5–1.9)

## 2020-05-04 LAB — PROTIME-INR
INR: 0.9 (ref 0.8–1.2)
Prothrombin Time: 12.2 seconds (ref 11.4–15.2)

## 2020-05-04 LAB — TSH: TSH: 1.148 u[IU]/mL (ref 0.350–4.500)

## 2020-05-04 LAB — CK: Total CK: 32 U/L — ABNORMAL LOW (ref 38–234)

## 2020-05-04 SURGERY — ECHOCARDIOGRAM, TRANSESOPHAGEAL
Anesthesia: Moderate Sedation

## 2020-05-04 MED ORDER — SODIUM CHLORIDE 0.9 % IV SOLN
500.0000 mg | Freq: Every day | INTRAVENOUS | Status: DC
  Administered 2020-05-04 – 2020-05-06 (×3): 500 mg via INTRAVENOUS
  Filled 2020-05-04 (×7): qty 10

## 2020-05-04 MED ORDER — SODIUM CHLORIDE 0.9 % IV SOLN: INTRAVENOUS | Status: DC

## 2020-05-04 NOTE — Consult Note (Addendum)
NEURO HOSPITALIST CONSULT NOTE   Requesting physician: Dr. Si Raider  Reason for Consult: AMS  History obtained from:  Chart     HPI:                                                                                                                                          Susan House is an 64 y.o. female with a complex PMHx including carotid artery disease, DM, CAD, diastolic dysfunction, HLD, HTN, stroke, TIA and possible prior seizure in 2018, who presented to [facility] on 10/12 with altered mental status. Her AMS was thought to be secondary to a metabolic/infectious etiology given a UTI with sepsis, AKI, and vomiting 2 days prior to admission that occurred in conjunction with onset of her AMS. Of note, she has had multiple falls since Jan 2020 with contusions and fractures. ID consultant is following her for polymicrobial bacteremia. She has a calcifice mitral valve and in the setting of her history of CVA and AMS, ID recommended TEE to assess for possible valve vegetation.   Her AMS has waxed and waned this hospitalization, but was always alert for the [facility] Medicine attending. She has frequently been able to have an extended discussion as well, per [facility] Medicine. Today, she was somnolent and not very arousable, also not answering questions.   Past Medical History:  Diagnosis Date  . Allergy   . Arthritis   . Atypical chest pain    a. Reportedly nl cath in the past; b. 12/2009 MV: No ischemia/infarct. EF 87%.  . Carotid arterial disease ([place])    a. 11/6431 CTA head/neck: LICA 29J, RICA 18-->ACZ rx.  . Cellulitis and abscess of face   . Chronic Dizziness   . Coronary artery calcification seen on CT scan    a. 10/2016 CTA chest: Atherosclerotic calcifications Ao, cors, and prox great vessels.  . Depression   . Diabetes mellitus ([place])   . Diastolic dysfunction    a. 2008 Echo: Nl EF; b. 2011 Echo: Nl EF; c. 06/2016 Echo: EF 55-60%, Gr1DD; d. 03/2018 Echo: EF 55-60%, no  rwma, Gr1DD, mod dil LA.  . Edema   . GERD (gastroesophageal reflux disease)   . Headache   . Hematuria   . Hyperlipidemia   . Hypertension   . IBS (irritable bowel syndrome)   . Migraine   . Morbid obesity ([place])   . Nocturia   . Possible Seizure ([place])    a. 10/2016 ? sz. MRI neg for stroke.  . Stroke ([place]) 2000  . TIA (transient ischemic attack) 2010 & 2019   a. 2010; b. 03/2018 Left sided wkns-->MRI neg for stroke. CTA head/neck: Right A2 cut off/occlusion w/ collats extending over R cerebral convexity. LICA 66A, RICA 45, RSubclavian 50->med rx.  Marland Kitchen  Urgency of micturation   . Urinary frequency   . Urinary incontinence     Past Surgical History:  Procedure Laterality Date  . ABDOMINAL HYSTERECTOMY    . ABDOMINOPLASTY    . APPENDECTOMY    . BACK SURGERY     extensive spinal fusion  . carpel tunnel surgery    . CATARACT EXTRACTION     x 2  . CESAREAN SECTION    . CHOLECYSTECTOMY    . COLONOSCOPY  2010  . COLONOSCOPY WITH PROPOFOL N/A 07/11/2016   Procedure: COLONOSCOPY WITH PROPOFOL;  Surgeon: Lucilla Lame, MD;  Location: [facility] ENDOSCOPY;  Service: Endoscopy;  Laterality: N/A;  . COLONOSCOPY WITH PROPOFOL N/A 03/26/2018   Procedure: COLONOSCOPY WITH PROPOFOL;  Surgeon: Lucilla Lame, MD;  Location: [facility] ENDOSCOPY;  Service: Endoscopy;  Laterality: N/A;  . ESOPHAGOGASTRODUODENOSCOPY (EGD) WITH PROPOFOL N/A 03/26/2018   Procedure: ESOPHAGOGASTRODUODENOSCOPY (EGD) WITH PROPOFOL;  Surgeon: Lucilla Lame, MD;  Location: [facility] ENDOSCOPY;  Service: Endoscopy;  Laterality: N/A;  . EYE SURGERY    . HERNIA REPAIR    . SHOULDER SURGERY    . TOE SURGERY    . UPPER GI ENDOSCOPY    . WRIST FRACTURE SURGERY Left     Family History  Problem Relation Age of Onset  . Hyperlipidemia Mother   . Arrhythmia Mother        WPW  . Hypertension Mother   . Rheum arthritis Mother   . Heart disease Mother   . Heart attack Father 80  . Hyperlipidemia Father   . Hypertension Father   . Stroke Father    . Diabetes Father   . Heart disease Father   . Coronary artery disease Father   . Diabetes Sister   . Hyperlipidemia Sister   . Hypertension Sister   . Depression Sister   . Depression Sister   . Diabetes Sister   . Hypertension Sister   . Hyperlipidemia Sister   . Kidney disease Paternal Grandmother              Social History:  reports that she has never smoked. She has never used smokeless tobacco. She reports that she does not drink alcohol and does not use drugs.  Allergies  Allergen Reactions  . Morphine And Related Anaphylaxis  . Reglan [Metoclopramide]     Other reaction(s): Unknown Elevated BP  . Simvastatin Other (See Comments)    Muscle pain  . Betadine [Povidone Iodine] Rash  . Tetracyclines & Related Rash    Other reaction(s): Unknown    MEDICATIONS:                                                                                                                     Scheduled: . amLODipine  10 mg Oral Daily  . aspirin  81 mg Oral Daily  . atorvastatin  20 mg Oral q1800  . Chlorhexidine Gluconate Cloth  6 each Topical Daily  . clopidogrel  75 mg Oral Daily  . feeding supplement (NEPRO  CARB STEADY)  237 mL Oral BID BM  . hydrALAZINE  100 mg Oral BID  . insulin aspart  0-9 Units Subcutaneous TID WC  . insulin aspart  5 Units Subcutaneous TID WC  . insulin glargine  22 Units Subcutaneous QHS  . levETIRAcetam  500 mg Oral BID  . metoprolol succinate  100 mg Oral Daily  . multivitamin with minerals  1 tablet Oral Daily  . sodium chloride flush  10-40 mL Intracatheter Q12H   Continuous: . sodium chloride 100 mL/hr at 05/04/20 1826  . DAPTOmycin (CUBICIN)  IV 500 mg (05/04/20 1345)     ROS:                                                                                                                                       Unable to obtain due to dysphasia.    Blood pressure (!) 162/69, pulse (!) 105, temperature 99.9 F (37.7 C), temperature source Oral,  resp. rate 17, height 5\' 3"  (1.6 m), weight 92.2 kg, SpO2 98 %.   General Examination:                                                                                                       Physical Exam  HEENT-  [place]/AT  Lungs- Respirations unlabored Extremities- Warm and well-perfused  Neurological Examination Mental Status: Awake and alert with decreased attention. Limited verbal output is non-fluent and does not pertain to questions asked. Follows approximately 20% of simple commands correctly. Appears confused. Unable to answer any orientation questions.  Cranial Nerves: II: Unable to formally assess visual fields, but does track examiner visually. PERRL.  III,IV, VI: Briefly tracks with conjugate EOM. No forced gaze deviation.  V,VII: Does not reliably respond when testing sensation. Face symmetric. Does not smile to command.  VIII: Turns head towards voice.  IX,X: Unable to assess XI: Head is midline XII: Does not protrude tongue to command Motor: RUE and RLE move to command with > 4/5 strength.  LUE with flexion contractures to elbow, wrist and digits. Able to move with 2/5 strength to command.  LLE 1-2/5 strength to command. Sensory: Reacts normally to stimulation on the right, but with decreased responsiveness on the left.  Deep Tendon Reflexes: Hyperreflexic x 4, worse on the left.  Plantars: Right: downgoing   Left: Equivocal Cerebellar/Gait: Unable to assess   Lab Results: Basic Metabolic Panel: Recent Labs  Lab 04/28/20 1253 04/28/20 1253 04/29/20 0535 04/29/20 0535 04/30/20 0457 04/30/20  4401 05/01/20 0534 05/01/20 0534 05/02/20 0408 05/03/20 0318 05/04/20 0529  NA 143   < > 140   < > 136  --  133*  --  132* 132* 133*  K 3.1*   < > 4.6   < > 4.0  --  4.0  --  4.1 4.0 4.0  CL 107   < > 111   < > 107  --  103  --  102 99 101  CO2 21*   < > 20*   < > 19*  --  21*  --  23 25 23   GLUCOSE 195*   < > 169*   < > 173*  --  228*  --  290* 289* 193*  BUN 15   < > 15    < > 19  --  25*  --  33* 38* 41*  CREATININE 1.37*   < > 1.33*   < > 1.45*  --  1.65*  --  1.65* 2.14* 1.96*  CALCIUM 8.2*   < > 7.9*   < > 7.9*   < > 8.2*   < > 7.9* 7.9* 8.2*  MG 1.4*  --  1.9  --   --   --   --   --   --   --   --    < > = values in this interval not displayed.    CBC: Recent Labs  Lab 04/29/20 1134 04/29/20 1134 04/30/20 0457 04/30/20 0457 05/01/20 0534 05/02/20 0408 05/03/20 0318 05/04/20 0529 05/04/20 1349  WBC 6.3   < > 6.6   < > 6.9 7.9 9.2 10.7* 9.9  NEUTROABS 4.2  --  3.3  --  3.3 4.3 5.8  --   --   HGB 9.7*   < > 10.0*   < > 9.3* 9.4* 9.7* 8.3* 8.4*  HCT 30.0*   < > 30.3*   < > 28.3* 28.2* 29.2* 25.2* 25.4*  MCV 86.2   < > 85.4   < > 84.2 83.2 84.4 84.3 84.1  PLT 242   < > 224   < > 203 235 269 287 302   < > = values in this interval not displayed.    Cardiac Enzymes: Recent Labs  Lab 05/03/20 2310 05/04/20 0529  CKTOTAL 25* 32*    Lipid Panel: No results for input(s): CHOL, TRIG, HDL, CHOLHDL, VLDL, LDLCALC in the last 168 hours.  Imaging: EEG  Result Date: 05/04/2020 Lora Havens, MD     05/04/2020  1:37 PM Patient Name: Nan Maya MRN: [id number] Epilepsy Attending: Lora Havens Referring Physician/Provider: Dr Laurey Arrow Date: 05/04/2020 Duration: 22.29 minutes Patient history: 64 year old female with altered mental status.  EEG to evaluate for seizures. Level of alertness: Awake/ lethargic AEDs during EEG study: LEV Technical aspects: This EEG study was done with scalp electrodes positioned according to the 10-20 International system of electrode placement. Electrical activity was acquired at a sampling rate of 500Hz  and reviewed with a high frequency filter of 70Hz  and a low frequency filter of 1Hz . EEG data were recorded continuously and digitally stored. Description: No clear posterior dominant rhythm seen.  EEG showed continuous generalized polymorphic 3 to 6 Hz theta-delta slowing.  Patient was noted to have right arm  tremor-like movement with concomitant EEG before, during and after the event did not show any EEG changes to suggest seizure.  Hyperventilation and photic stimulation were not performed.   ABNORMALITY -Continuous slow, generalized  IMPRESSION: This study is suggestive of moderate diffuse encephalopathy of nonspecific etiology.  Patient was noted to have right arm tremor like movement without concomitant EEG changes and was likely not epileptic.  No seizures or epileptiform discharges were seen throughout the recording. Lora Havens   CT HEAD WO CONTRAST  Result Date: 05/04/2020 CLINICAL DATA:  Mental status change EXAM: CT HEAD WITHOUT CONTRAST TECHNIQUE: Contiguous axial images were obtained from the base of the skull through the vertex without intravenous contrast. COMPARISON:  04/27/2020 FINDINGS: Brain: There is no acute intracranial hemorrhage, mass effect, or edema. No new loss of gray-white differentiation. Chronic infarction of the left basal ganglia and adjacent white matter is again identified. Additional patchy and confluent areas of hypoattenuation in the supratentorial white matter likely reflect stable chronic microvascular ischemic changes. There is no extra-axial fluid collection. Ventricles and sulci are stable size and configuration. Vascular: There is atherosclerotic calcification at the skull base. Skull: Calvarium is unremarkable. Sinuses/Orbits: No acute finding. Other: None. IMPRESSION: No acute intracranial abnormality. No significant change since 04/27/2020. Electronically Signed   By: Macy Mis M.D.   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 Her AMS has waxed and waned this hospitalization, but was always alert for the [facility] Medicine attending. She has frequently been able to have an extended discussion as well, per [facility] Medicine. Today,  she was somnolent and not very arousable, also not answering questions.  1. Exam reveals dense receptive and expressive dysphasia, as well as left hemiplegia. The latter is mentioned in the notes as being secondary to her prior stroke; however, review of her CT scan reveals no right sided lesion to account for this, despite the clearly chronic findings on today's exam. Of note, on initial H and P she is documented as moving all 4 extremities without lateralized weakness. Her aphasia seen on exam today may be new and it is possible that this is secondary to a new left perisylvian stroke.  2. CT head:  Chronic infarction of the left basal ganglia and adjacent white matter is again identified. Additional patchy and confluent areas of hypoattenuation in the supratentorial white matter likely reflect stable chronic microvascular ischemic changes. 3. DDx for her AMS is felt to possibly include seizure. An EEG has been ordered. She has a history of seizure and is on Keppra at home.  4. Gabapentin has been held.   Recommendations: 1. Agree with ID consultant's plan for TEE.  ID consultant is following her for polymicrobial bacteremia. She has a calcified mitral valve and in the setting of her history of CVA and AMS, ID recommended TEE to assess for possible valve vegetation.  2. EEG has been completed with official report pending.  3. MRI brain.  4. As LKN is uncertain given waxing and waning mentation during this admission as well as no clear documented time of onset of her left sided weakness, she would not be a candidate for thrombectomy or IV tPA.    Addendum: EEG report:  ABNORMALITY -Continuous slow, generalized IMPRESSION: This study is suggestive of moderate diffuse encephalopathy of nonspecific etiology.  Patient was noted to have right arm tremor like movement without concomitant EEG changes and was likely not epileptic.  No seizures or epileptiform discharges were seen throughout the  recording.   Electronically signed: Dr. Kerney Elbe 05/04/2020, 8:28 PM

## 2020-05-04 NOTE — Progress Notes (Addendum)
PROGRESS NOTE    Susan House  YKD:[id number] DOB: 12-05-1955 DOA: 04/27/2020 PCP: Jerrol Banana., MD   Susan House  is a 64 y.o. Caucasian female with a known history of multiple medical problems that are mentioned below including hypertension, dyslipidemia, CVA and TIA, who presented to the emergency room with acute onset of altered mental status.  She has been having decreased energy level and vomiting at home.  She is a very poor historian and has not been able to provide any meaningful history.  She was tachycardic and tachypneic upon arrival.  She was having extensive fecal material around her and several bruises.  No reported chest pain or palpitations.  No reported cough or wheezing or dyspnea.  It is unclear if she has been having urinary symptoms including dysuria or frequency but she answers yes to all questions.   Upon presentation to the emergency room blood pressure was 167/92 with a heart rate of 126 and respiratory to 26 with pulse symmetry of 98% on room air.  Labs revealed ABG with pH 7.37 and HCO3 of 12.7 and BMP revealed a CO2 of 14 with a blood glucose of 448 and a creatinine of 1.9 with anion gap of 23 magnesium of 1.3 and total bili of 2.1 with lactic acid of 2.1 later 2.3 and CBC showed leukocytosis 11.4 with relative neutrophilia.  Beta hydroxybutyrate was 7.11.  COVID-19 PCR and influenza antigens came back negative.  UA showed more than 50 WBCs more than 300 protein and 20 ketones and more than 500 glucose.   The patient was given 2 g of IV magnesium sulfate, total bolus of IV lactated Ringer followed by 125 mL/h; of IV potassium chloride.  She will be admitted to a stepdown unit for further evaluation and management.     Assessment & Plan:   Active Problems:   DKA (diabetic ketoacidosis) ([place])   Pressure injury of skin  Acute encephalopathy Presented for this and although mental status has waxed and waned somewhat this hospitalization was  always alert for me, frequently able to have extended discussion. This morning somnolent and not very arousable, not answering questions. Vitals are stable, labs are stable save for slight down-trend of h/h, no report of melena or hematochezia - continue cardiac monitoring, continuous pulse o2 - repeat cbc, check LFTs, ammonia, abg, tsh, b12 - also CT head, has hx of stroke, also checking EEG, has hx of possible seizure and is on keppra at home - neurology has been consulted - hold gabapentin, lovenox - slp previously consulted consulted - dysphagia 2 diet, thin liquids  DKA: secondary to poorly controlled DM2 and bloodstream infection. Resolved. Glucose today 200s. Off insulin gtt and IV fluids - cont 22 qhs, continue SSI, added novolog 5 w/ meals - cont carb modified diet - appreciate dm coordinator input  Bacteremia  Sepsis ruled out:  presented w/ tachycardia, tachypnea & bacteremia & possible UTI. Has spinal hardware, no tenderness at site. Hemodynamically stable currently. Blood cultures growing MSSA, second set obtained 10/14 ngtd - appreciate ID recs - s/p vancomycin given up-trending cr, started on taptomycin - TTE neg for vegetations, ID advising  - cardiology has seen, TEE planned for this morning on hold given encephalopathy - picc placed 10/18 - plan on d/c to SNF, has authorization and a bed  Hypokalemia - k normal today - monitor  Normocytic anemia - baseline hgb 11s, here has been trending 9-10, 8s today. No melena or hematochezia or  other bleeding - repeat cbc, stool occult blood ordered  AKI on ckd 3: baseline cr 1.3-1.5, up to 2.14 yesterday, today to 1.96. had started hctz a few days ago. 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 Does not answer questions.  Objective: Vitals:   05/04/20 0400 05/04/20 0500 05/04/20 0600 05/04/20 0757  BP:    (!) 158/72  Pulse:    89  Resp: 16 17 11 17   Temp:    98.8 F (37.1 C)  TempSrc:      SpO2:    96%  Weight:      Height:        Intake/Output Summary (Last 24 hours) at 05/04/2020 1055 Last data filed at 05/04/2020 1040 Gross per 24 hour  Intake 1703.21 ml  Output 2000 ml  Net -296.79 ml   Filed Weights   04/30/20 0406 05/01/20 0426 05/02/20 0418  Weight: 96.2 kg 99.2 kg 92.2 kg    Examination:  General exam: somnolent, somewhat arousable Respiratory system: diminished breath sounds b/l  Cardiovascular system: S1 & S2 +. No ubs, gallops or clicks. Trace pedal edema Gastrointestinal system: Abdomen is obese, soft and nontender. Hypoactive bowel sounds Central nervous system: moves all 4 extremities Psychiatry: encephalopathic Skin: erythema of sacrum    Data Reviewed: I have personally reviewed following labs and imaging studies  CBC: Recent Labs  Lab 04/29/20 1134 04/29/20 1134 04/30/20 0457 05/01/20 0534 05/02/20 0408 05/03/20 0318 05/04/20 0529  WBC 6.3   < > 6.6 6.9 7.9 9.2 10.7*  NEUTROABS 4.2  --  3.3 3.3 4.3 5.8  --   HGB 9.7*   < > 10.0* 9.3* 9.4* 9.7* 8.3*  HCT 30.0*   < > 30.3* 28.3* 28.2* 29.2* 25.2*  MCV 86.2   < > 85.4  84.2 83.2 84.4 84.3  PLT 242   < > 224 203 235 269 287   < > = values in this interval not displayed.   Basic Metabolic Panel: Recent Labs  Lab 04/28/20 1253 04/28/20 1253 04/29/20 0535 04/29/20 0535 04/30/20 0457 05/01/20 0534 05/02/20 0408 05/03/20 0318 05/04/20 0529  NA 143   < > 140   < > 136 133* 132* 132* 133*  K 3.1*   < > 4.6   < > 4.0 4.0 4.1 4.0 4.0  CL 107   < > 111   < > 107 103 102 99 101  CO2 21*   < > 20*   < > 19* 21* 23 25 23   GLUCOSE 195*   < > 169*   < > 173* 228* 290* 289* 193*  BUN 15   < >  15   < > 19 25* 33* 38* 41*  CREATININE 1.37*   < > 1.33*   < > 1.45* 1.65* 1.65* 2.14* 1.96*  CALCIUM 8.2*   < > 7.9*   < > 7.9* 8.2* 7.9* 7.9* 8.2*  MG 1.4*  --  1.9  --   --   --   --   --   --    < > = values in this interval not displayed.   GFR: Estimated Creatinine Clearance: 31.3 mL/min (A) (by C-G formula based on SCr of 1.96 mg/dL (H)). Liver Function Tests: Recent Labs  Lab 04/28/20 1253  AST 27  ALT 13  ALKPHOS 55  BILITOT 1.0  PROT 6.0*  ALBUMIN 2.9*   No results for input(s): LIPASE, AMYLASE in the last 168 hours. No results for input(s): AMMONIA in the last 168 hours. Coagulation Profile: No results for input(s): INR, PROTIME in the last 168 hours. Cardiac Enzymes: Recent Labs  Lab 05/03/20 2310 05/04/20 0529  CKTOTAL 25* 32*   BNP (last 3 results) No results for input(s): PROBNP in the last 8760 hours. HbA1C: No results for input(s): HGBA1C in the last 72 hours. CBG: Recent Labs  Lab 05/03/20 0748 05/03/20 1156 05/03/20 1635 05/03/20 2207 05/04/20 0757  GLUCAP 230* 218* 321* 294* 170*   Lipid Profile: No results for input(s): CHOL, HDL, LDLCALC, TRIG, CHOLHDL, LDLDIRECT in the last 72 hours. Thyroid Function Tests: No results for input(s): TSH, T4TOTAL, FREET4, T3FREE, THYROIDAB in the last 72 hours. Anemia Panel: No results for input(s): VITAMINB12, FOLATE, FERRITIN, TIBC, IRON, RETICCTPCT in the last 72 hours. Sepsis  Labs: Recent Labs  Lab 04/27/20 1821  LATICACIDVEN 0.8    Recent Results (from the past 240 hour(s))  Blood Culture (routine x 2)     Status: Abnormal   Collection Time: 04/27/20 12:55 AM   Specimen: BLOOD  Result Value Ref Range Status   Specimen Description   Final    BLOOD LEFT [facility] Performed at [facility], [address] [place], [place] [address]    Special Requests   Final    BOTTLES DRAWN AEROBIC AND ANAEROBIC Blood Culture results may not be optimal due to an excessive volume of blood received in culture bottles Performed at [facility], [address] [place], [place] [address]    Culture  Setup Time   Final    GRAM POSITIVE COCCI IN BOTH AEROBIC AND ANAEROBIC BOTTLES CRITICAL RESULT CALLED TO, READ BACK BY AND VERIFIED WITH: SUSAN WATSON AT 6270 ON 04/27/2020 [place].    Culture (A)  Final    STAPHYLOCOCCUS AUREUS ENTEROCOCCUS FAECALIS STAPHYLOCOCCUS SIMULANS STAPHYLOCOCCUS HOMINIS STAPHYLOCOCCUS EPIDERMIDIS ID BY BCID UNABLE TO RECOVER FROM CULTURE THE SIGNIFICANCE OF ISOLATING THIS ORGANISM FROM A SINGLE SET OF BLOOD CULTURES WHEN MULTIPLE SETS ARE DRAWN IS UNCERTAIN. PLEASE NOTIFY THE MICROBIOLOGY DEPARTMENT WITHIN ONE WEEK IF SPECIATION AND SENSITIVITIES ARE REQUIRED. Performed at [facility], [place] [address] [place], [place] [address]    Report Status 05/02/2020 FINAL  Final   Organism ID, Bacteria STAPHYLOCOCCUS AUREUS  Final   Organism ID, Bacteria ENTEROCOCCUS FAECALIS  Final      Susceptibility   Enterococcus faecalis - MIC*    AMPICILLIN <=2 SENSITIVE Sensitive     VANCOMYCIN 1 SENSITIVE Sensitive     GENTAMICIN SYNERGY SENSITIVE Sensitive     * ENTEROCOCCUS FAECALIS   Staphylococcus aureus - MIC*    CIPROFLOXACIN >=8 RESISTANT Resistant  ERYTHROMYCIN >=8 RESISTANT Resistant     GENTAMICIN <=0.5 SENSITIVE Sensitive     OXACILLIN <=0.25 SENSITIVE Sensitive     TETRACYCLINE <=1 SENSITIVE Sensitive     VANCOMYCIN <=0.5  SENSITIVE Sensitive     TRIMETH/SULFA <=10 SENSITIVE Sensitive     CLINDAMYCIN RESISTANT Resistant     RIFAMPIN <=0.5 SENSITIVE Sensitive     Inducible Clindamycin POSITIVE Resistant     * STAPHYLOCOCCUS AUREUS  Blood Culture (routine x 2)     Status: None   Collection Time: 04/27/20 12:55 AM   Specimen: BLOOD  Result Value Ref Range Status   Specimen Description BLOOD RIGTH HAND  Final   Special Requests   Final    BOTTLES DRAWN AEROBIC AND ANAEROBIC Blood Culture adequate volume   Culture   Final    NO GROWTH 5 DAYS Performed at [facility], [address] [place], [place] [address]    Report Status 05/02/2020 FINAL  Final  Respiratory Panel by RT PCR (Flu A&B, Covid) - Nasopharyngeal Swab     Status: None   Collection Time: 04/27/20 12:55 AM   Specimen: Nasopharyngeal Swab  Result Value Ref Range Status   SARS Coronavirus 2 by RT PCR NEGATIVE NEGATIVE Final    Comment: (NOTE) SARS-CoV-2 target nucleic acids are NOT DETECTED.  The SARS-CoV-2 RNA is generally detectable in upper respiratoy specimens during the acute phase of infection. The lowest concentration of SARS-CoV-2 viral copies this assay can detect is 131 copies/mL. A negative result does not preclude SARS-Cov-2 infection and should not be used as the sole basis for treatment or other patient management decisions. A negative result may occur with  improper specimen collection/handling, submission of specimen other than nasopharyngeal swab, presence of viral mutation(s) within the areas targeted by this assay, and inadequate number of viral copies (<131 copies/mL). A negative result must be combined with clinical observations, patient history, and epidemiological information. The expected result is Negative.  Fact Sheet for Patients:  PinkCheek.be  Fact Sheet for Healthcare Providers:  GravelBags.it  This test is no t yet approved or cleared by  the [place] FDA and  has been authorized for detection and/or diagnosis of SARS-CoV-2 by FDA under an Emergency Use Authorization (EUA). This EUA will remain  in effect (meaning this test can be used) for the duration of the COVID-19 declaration under Section 564(b)(1) of the Act, 21 U.S.C. section 360bbb-3(b)(1), unless the authorization is terminated or revoked sooner.     Influenza A by PCR NEGATIVE NEGATIVE Final   Influenza B by PCR NEGATIVE NEGATIVE Final    Comment: (NOTE) The Xpert Xpress SARS-CoV-2/FLU/RSV assay is intended as an aid in  the diagnosis of influenza from Nasopharyngeal swab specimens and  should not be used as a sole basis for treatment. Nasal washings and  aspirates are unacceptable for Xpert Xpress SARS-CoV-2/FLU/RSV  testing.  Fact Sheet for Patients: PinkCheek.be  Fact Sheet for Healthcare Providers: GravelBags.it  This test is not yet approved or cleared by the [place] FDA and  has been authorized for detection and/or diagnosis of SARS-CoV-2 by  FDA under an Emergency Use Authorization (EUA). This EUA will remain  in effect (meaning this test can be used) for the duration of the  Covid-19 declaration under Section 564(b)(1) of the Act, 21  U.S.C. section 360bbb-3(b)(1), unless the authorization is  terminated or revoked. Performed at [facility], [address] [place],  [address]   Blood Culture ID Panel (Reflexed)  Status: Abnormal   Collection Time: 04/27/20 12:55 AM  Result Value Ref Range Status   Enterococcus faecalis NOT DETECTED NOT DETECTED Final   Enterococcus Faecium NOT DETECTED NOT DETECTED Final   Listeria monocytogenes NOT DETECTED NOT DETECTED Final   Staphylococcus species DETECTED (A) NOT DETECTED Final    Comment: CRITICAL RESULT CALLED TO, READ BACK BY AND VERIFIED WITH: SUSAN WATSON AT 1424 ON 04/27/2020 [place].    Staphylococcus aureus  (BCID) DETECTED (A) NOT DETECTED Final    Comment: CRITICAL RESULT CALLED TO, READ BACK BY AND VERIFIED WITH: SUSAN WATSON AT 1424 ON 04/27/2020 [place].    Staphylococcus epidermidis DETECTED (A) NOT DETECTED Final    Comment: CRITICAL RESULT CALLED TO, READ BACK BY AND VERIFIED WITH: SUSAN WATSON AT 1424 ON 04/27/2020 [place].    Staphylococcus lugdunensis NOT DETECTED NOT DETECTED Final   Streptococcus species NOT DETECTED NOT DETECTED Final   Streptococcus agalactiae NOT DETECTED NOT DETECTED Final   Streptococcus pneumoniae NOT DETECTED NOT DETECTED Final   Streptococcus pyogenes NOT DETECTED NOT DETECTED Final   A.calcoaceticus-baumannii NOT DETECTED NOT DETECTED Final   Bacteroides fragilis NOT DETECTED NOT DETECTED Final   Enterobacterales NOT DETECTED NOT DETECTED Final   Enterobacter cloacae complex NOT DETECTED NOT DETECTED Final   Escherichia coli NOT DETECTED NOT DETECTED Final   Klebsiella aerogenes NOT DETECTED NOT DETECTED Final   Klebsiella oxytoca NOT DETECTED NOT DETECTED Final   Klebsiella pneumoniae NOT DETECTED NOT DETECTED Final   Proteus species NOT DETECTED NOT DETECTED Final   Salmonella species NOT DETECTED NOT DETECTED Final   Serratia marcescens NOT DETECTED NOT DETECTED Final   Haemophilus influenzae NOT DETECTED NOT DETECTED Final   Neisseria meningitidis NOT DETECTED NOT DETECTED Final   Pseudomonas aeruginosa NOT DETECTED NOT DETECTED Final   Stenotrophomonas maltophilia NOT DETECTED NOT DETECTED Final   Candida albicans NOT DETECTED NOT DETECTED Final   Candida auris NOT DETECTED NOT DETECTED Final   Candida glabrata NOT DETECTED NOT DETECTED Final   Candida krusei NOT DETECTED NOT DETECTED Final   Candida parapsilosis NOT DETECTED NOT DETECTED Final   Candida tropicalis NOT DETECTED NOT DETECTED Final   Cryptococcus neoformans/gattii NOT DETECTED NOT DETECTED Final   Methicillin resistance mecA/C DETECTED (A) NOT DETECTED Final    Comment: CRITICAL  RESULT CALLED TO, READ BACK BY AND VERIFIED WITH: SUSAN WATSON AT 1424 ON 04/27/2020 [place].    Meth resistant mecA/C and MREJ NOT DETECTED NOT DETECTED Final    Comment: Performed at [facility], [address] [place], [place] [address]  Urine culture     Status: Abnormal   Collection Time: 04/27/20  1:57 AM   Specimen: In/Out Cath Urine  Result Value Ref Range Status   Specimen Description   Final    IN/OUT CATH URINE Performed at [facility], [address] [place], [place] [address]    Special Requests   Final    NONE Performed at [facility], [address] [place], [place] [address]    Culture MULTIPLE SPECIES PRESENT, SUGGEST RECOLLECTION (A)  Final   Report Status 04/27/2020 FINAL  Final  CULTURE, BLOOD (ROUTINE X 2) w Reflex to ID Panel     Status: None   Collection Time: 04/29/20  7:05 PM   Specimen: BLOOD  Result Value Ref Range Status   Specimen Description BLOOD BLOOD RIGHT HAND  Final   Special Requests   Final    BOTTLES DRAWN AEROBIC AND ANAEROBIC Blood Culture  adequate volume   Culture   Final    NO GROWTH 5 DAYS Performed at [facility], [address] [place], [place] [address]    Report Status 05/04/2020 FINAL  Final  CULTURE, BLOOD (ROUTINE X 2) w Reflex to ID Panel     Status: None   Collection Time: 04/29/20  7:06 PM   Specimen: BLOOD  Result Value Ref Range Status   Specimen Description BLOOD RIGHT ANTECUBITAL  Final   Special Requests   Final    BOTTLES DRAWN AEROBIC AND ANAEROBIC Blood Culture results may not be optimal due to an excessive volume of blood received in culture bottles   Culture   Final    NO GROWTH 5 DAYS Performed at [facility], [address], [place], [place] [address]    Report Status 05/04/2020 FINAL  Final         Radiology Studies: [place] EKG SITE RITE  Result Date: 05/02/2020 If Site Rite image not attached, placement could not be confirmed due to current cardiac  rhythm.       Scheduled Meds:  amLODipine  10 mg Oral Daily   aspirin  81 mg Oral Daily   atorvastatin  20 mg Oral q1800   Chlorhexidine Gluconate Cloth  6 each Topical Daily   clopidogrel  75 mg Oral Daily   enoxaparin (LOVENOX) injection  30 mg Subcutaneous Q24H   feeding supplement (NEPRO CARB STEADY)  237 mL Oral BID BM   hydrALAZINE  100 mg Oral BID   insulin aspart  0-9 Units Subcutaneous TID WC   insulin aspart  5 Units Subcutaneous TID WC   insulin glargine  22 Units Subcutaneous QHS   levETIRAcetam  500 mg Oral BID   metoprolol succinate  100 mg Oral Daily   multivitamin with minerals  1 tablet Oral Daily   sodium chloride flush  10-40 mL Intracatheter Q12H   Continuous Infusions:  sodium chloride     DAPTOmycin (CUBICIN)  IV       LOS: 7 days    Time spent: 35 mins     Desma Maxim, MD [facility]  If 7PM-7AM, please contact night-coverage [email or web address] 05/04/2020, 10:55 AM

## 2020-05-04 NOTE — Plan of Care (Signed)
  Problem: Metabolic: Goal: Ability to maintain appropriate glucose levels will improve Outcome: Progressing   Problem: Fluid Volume: Goal: Ability to maintain a balanced intake and output will improve Outcome: Not Progressing   Problem: Health Behavior/Discharge Planning: Goal: Ability to manage health-related needs will improve Outcome: Not Progressing   Problem: Activity: Goal: Risk for activity intolerance will decrease Outcome: Not Progressing

## 2020-05-04 NOTE — Progress Notes (Signed)
ID  she was throwing up on Sunday 04/25/20 and it got worse on monday , she was confused and she fell husband  called EMS on Monday night Since jan 2020 pt has had multiple falls- with contusions, fracture ribs , fracture of toe, and on another occasion in April 2021  fracture of ulna styloid.  Patient Vitals for the past 24 hrs:  BP Temp Temp src Pulse Resp SpO2  05/04/20 1221 (!) 151/82 98.1 F (36.7 C) -- 92 16 97 %  05/04/20 0757 (!) 158/72 98.8 F (37.1 C) -- 89 17 96 %  05/04/20 0600 -- -- -- -- 11 --  05/04/20 0500 -- -- -- -- 17 --  05/04/20 0400 -- -- -- -- 16 --  05/04/20 0352 (!) 154/99 -- -- 98 -- --  05/04/20 0350 (!) 162/103 97.6 F (36.4 C) Oral 90 18 99 %  05/04/20 0300 -- -- -- -- 15 --  05/04/20 0200 -- -- -- -- 19 --  05/04/20 0100 -- -- -- -- (!) 22 --  05/04/20 0000 -- -- -- -- 17 --  05/03/20 2300 -- -- -- -- 17 --  05/03/20 2200 -- -- -- -- 16 --  05/03/20 2100 -- -- -- -- 14 --  05/03/20 2000 -- -- -- -- 15 --  05/03/20 1900 -- -- -- -- 14 --   Pt  Confused, minimally verbal Daughter at bed side    O/e awake but confused Tremors rt side > left Chest b/l air entry [id number] abd soft Left hemiparesis    CBC Latest Ref Rng & Units 05/04/2020 05/04/2020 05/03/2020  WBC 4.0 - 10.5 K/uL 9.9 10.7(H) 9.2  Hemoglobin 12.0 - 15.0 g/dL 8.4(L) 8.3(L) 9.7(L)  Hematocrit 36 - 46 % 25.4(L) 25.2(L) 29.2(L)  Platelets 150 - 400 K/uL 302 287 269    CMP Latest Ref Rng & Units 05/04/2020 05/03/2020 05/02/2020  Glucose 70 - 99 mg/dL 193(H) 289(H) 290(H)  BUN 8 - 23 mg/dL 41(H) 38(H) 33(H)  Creatinine 0.44 - 1.00 mg/dL 1.96(H) 2.14(H) 1.65(H)  Sodium 135 - 145 mmol/L 133(L) 132(L) 132(L)  Potassium 3.5 - 5.1 mmol/L 4.0 4.0 4.1  Chloride 98 - 111 mmol/L 101 99 102  CO2 22 - 32 mmol/L 23 25 23   Calcium 8.9 - 10.3 mg/dL 8.2(L) 7.9(L) 7.9(L)  Total Protein 6.5 - 8.1 g/dL 6.0(L) - -  Total Bilirubin 0.3 - 1.2 mg/dL 0.7 - -  Alkaline Phos 38 - 126 U/L PENDING - -   AST 15 - 41 U/L 24 - -  ALT 0 - 44 U/L 17 - -     Impression/recommendation  ?Altered mental status-fluctuating  has underlying CVA   DKA - treated with fluids, insulin drip  Poly microbial bacteremia ( 4 diffent organisms- MSSA ( not MRSA as reported before) Staph simulans, staph hominis staph epi, enterococcus fecalis from the left cubital fossa Pathogenic organisms ( 2) + skin contaminants ( 3)  -- source currently not clear This could very well be contaminant ( but  staph aureus cannot be considered a contaminant)    repeat  blood culture neg On daptomycin With a calcified mitral valve, CVA , AMS will get TEE to look for valve vegetation/PFO Will decide on duration post TEE-  Which can be only done when she is alert  AKI on CKD-   She has fusion hardware in her spine but site okay    UTI is questioned as urine analysis >50 WBC  post void  bladder scan- no urine  CVA- many old infarcts Also has left hemiparesis   Discussed the management with her daughter

## 2020-05-04 NOTE — Progress Notes (Signed)
Pharmacy Antibiotic Note  Susan House is a 64 y.o. female admitted on 04/27/2020 with bacteremia likely secondary to UTI. PMH for HTN, dyslipidemia, CVA and TIA. Patient was started on ceftriaxone and BCx 1/2 sets (both bottles) with E faecalis, MSSA, and multiple CoNS species.  Pharmacy has been consulted for daptomycin  dosing.    Today, 05/04/2020 Day #8 antibiotics -  vancomycin to daptomycin   Baseline CK = 32 (on atorvastatin 20mg )  Scr better this morning  Leukocytosis improved  Repeat blood cultures remain negative  TTE results neg. Still needs TEE  Source not entirely clear  Vancomycin has been dosed per levels  Trough 10/16 @ 1437 = 28, supratherapeutic  Trough 10/17 @ 0408 = 24, supratherapeutic  Trough 10/17 @ 1612 = 21, supratherapeutic  Plan:  Renal function better, adjust daptomycin to 500mg  IV q24h (~7.4 mg/kg) per adjusted body weight (68kg) for BMI = 36)  Follow CK at least weekly  Monitor renal function  Await TEE and length of therapy   Height: 5\' 3"  (160 cm) Weight: 92.2 kg (203 lb 4.2 oz) IBW/kg (Calculated) : 52.4  Temp (24hrs), Avg:98.3 F (36.8 C), Min:97.6 F (36.4 C), Max:98.8 F (37.1 C)  Recent Labs  Lab 04/27/20 1821 04/28/20 0547 04/30/20 0457 05/01/20 0534 05/01/20 1437 05/02/20 0408 05/02/20 1612 05/03/20 0318 05/04/20 0529  WBC  --    < > 6.6 6.9  --  7.9  --  9.2 10.7*  CREATININE  --    < > 1.45* 1.65*  --  1.65*  --  2.14* 1.96*  LATICACIDVEN 0.8  --   --   --   --   --   --   --   --   VANCOTROUGH  --    < >  --   --    < > 24* 21*  --   --    < > = values in this interval not displayed.    Estimated Creatinine Clearance: 31.3 mL/min (A) (by C-G formula based on SCr of 1.96 mg/dL (H)).    Allergies  Allergen Reactions  . Morphine And Related Anaphylaxis  . Reglan [Metoclopramide]     Other reaction(s): Unknown Elevated BP  . Simvastatin Other (See Comments)    Muscle pain  . Betadine [Povidone  Iodine] Rash  . Tetracyclines & Related Rash    Other reaction(s): Unknown    Antimicrobials this admission: 10/12 Ceftriaxone >> 10/13 10/12 Vancomycin >> 10/18 10/19 daptomycin >>  Microbiology results: 10/12 BCx: GPC 1/2 sets (both bottles) MSSA, E faecalis, S. Hominis, S. Simulans, S. Epidermidis (per BCID) 10/14 Bcx NGTD 10/12 UCx: multiple species  Thank you for allowing pharmacy to be a part of this patient's care.  Doreene Eland, PharmD, BCPS.   Work Cell: [phone] 05/04/2020 9:40 AM

## 2020-05-04 NOTE — Progress Notes (Signed)
   Progress Note  Patient Name: Susan House Date of Encounter: 05/04/2020   [facility] Cardiologist: Ida Rogue, MD  Patient was evaluated this morning prior to TEE and was found to be very lethargic.  She would briefly open eyes to voice and tactile stimulation but did not follow commands or speak.  At this time, I do not believe that it is safe to proceed with TEE with moderate sedation.  Patient's concerning status and decision to postpone TEE were communicated to Dr. Si Raider.  We will reassess the patient's mental status tomorrow to determine if/when it would be appropriate to proceed with TEE.  For questions or updates, please contact [place] Please consult [email or web address] for contact info under [facility].     Signed, Nelva Bush, MD  05/04/2020, 10:14 AM

## 2020-05-04 NOTE — Procedures (Addendum)
Patient Name: Chavie Kolinski  MRN: [id number]  Epilepsy Attending: Lora Havens  Referring Physician/Provider: Dr Laurey Arrow Date: 05/04/2020 Duration: 22.29 minutes  Patient history: 64 year old female with altered mental status.  EEG to evaluate for seizures.  Level of alertness: Awake/ lethargic  AEDs during EEG study: LEV  Technical aspects: This EEG study was done with scalp electrodes positioned according to the 10-20 International system of electrode placement. Electrical activity was acquired at a sampling rate of 500Hz  and reviewed with a high frequency filter of 70Hz  and a low frequency filter of 1Hz . EEG data were recorded continuously and digitally stored.   Description: No clear posterior dominant rhythm seen.  EEG showed continuous generalized polymorphic 3 to 6 Hz theta-delta slowing.  Patient was noted to have right arm tremor-like movement with concomitant EEG before, during and after the event did not show any EEG changes to suggest seizure.  Hyperventilation and photic stimulation were not performed.     ABNORMALITY -Continuous slow, generalized  IMPRESSION: This study is suggestive of moderate diffuse encephalopathy of nonspecific etiology.  Patient was noted to have right arm tremor like movement without concomitant EEG changes and was likely not epileptic.  No seizures or epileptiform discharges were seen throughout the recording.  Quaniya Damas Barbra Sarks

## 2020-05-04 NOTE — Progress Notes (Signed)
eeg done °

## 2020-05-04 NOTE — Progress Notes (Signed)
Pt lethargic and difficult to arouse this morning.  Will not maintain eye contact and did not speak.  All PO medications held due to NPO status, Lovenox held per MD order via smart text.   RN will continue to monitor.  Justice Britain, RN 05/04/2020 10:48 AM

## 2020-05-04 NOTE — Progress Notes (Signed)
Mobility Specialist - Progress Note   05/04/20 1528  Mobility  Activity Contraindicated/medical hold  Mobility performed by Mobility specialist    Per discussion w/ nurse, pt medically inappropriate to see today. States pt has been lethargic and having pain. Will hold and re-attempt session at a later date/time when appropriate.    Rollie Hynek Mobility Specialist  05/04/20, 3:30 PM

## 2020-05-05 ENCOUNTER — Inpatient Hospital Stay: Payer: Medicare Other

## 2020-05-05 DIAGNOSIS — A4101 Sepsis due to Methicillin susceptible Staphylococcus aureus: Secondary | ICD-10-CM

## 2020-05-05 DIAGNOSIS — R4701 Aphasia: Secondary | ICD-10-CM

## 2020-05-05 DIAGNOSIS — R6520 Severe sepsis without septic shock: Secondary | ICD-10-CM

## 2020-05-05 LAB — CBC
HCT: 23.5 % — ABNORMAL LOW (ref 36.0–46.0)
Hemoglobin: 7.8 g/dL — ABNORMAL LOW (ref 12.0–15.0)
MCH: 28.2 pg (ref 26.0–34.0)
MCHC: 33.2 g/dL (ref 30.0–36.0)
MCV: 84.8 fL (ref 80.0–100.0)
Platelets: 283 10*3/uL (ref 150–400)
RBC: 2.77 MIL/uL — ABNORMAL LOW (ref 3.87–5.11)
RDW: 15.8 % — ABNORMAL HIGH (ref 11.5–15.5)
WBC: 9.1 10*3/uL (ref 4.0–10.5)
nRBC: 0 % (ref 0.0–0.2)

## 2020-05-05 LAB — GLUCOSE, CAPILLARY
Glucose-Capillary: 134 mg/dL — ABNORMAL HIGH (ref 70–99)
Glucose-Capillary: 64 mg/dL — ABNORMAL LOW (ref 70–99)
Glucose-Capillary: 69 mg/dL — ABNORMAL LOW (ref 70–99)
Glucose-Capillary: 153 mg/dL — ABNORMAL HIGH (ref 70–99)
Glucose-Capillary: 277 mg/dL — ABNORMAL HIGH (ref 70–99)
Glucose-Capillary: 238 mg/dL — ABNORMAL HIGH (ref 70–99)

## 2020-05-05 MED ORDER — LACTULOSE 10 GM/15ML PO SOLN
20.0000 g | Freq: Once | ORAL | Status: AC
  Administered 2020-05-05: 20 g via ORAL
  Filled 2020-05-05: qty 30

## 2020-05-05 MED ORDER — DEXTROSE-NACL 5-0.9 % IV SOLN: INTRAVENOUS | Status: DC

## 2020-05-05 MED ORDER — ENOXAPARIN SODIUM 40 MG/0.4ML ~~LOC~~ SOLN
40.0000 mg | SUBCUTANEOUS | Status: DC
  Administered 2020-05-05 – 2020-05-08 (×4): 40 mg via SUBCUTANEOUS
  Filled 2020-05-05 (×4): qty 0.4

## 2020-05-05 NOTE — Progress Notes (Signed)
   Progress Note  Patient Name: Susan House Date of Encounter: 05/05/2020   [facility] Cardiologist: Ida Rogue, MD  Patient was evaluated this morning prior to TEE.    Previously scheduled 10/19 TEE had to be postponed due to change in mental status yesterday.  On 10/19, it was noted that she was lethargic and able to briefly open eyes to voice and respond to tactile stimulation but not follow commands or speak.  It was not felt safe to proceed with TEE with moderate sedation.  Recommendation was to reassess today.    This morning, 05/05/2020, she is unable to speak.  She is awake and alert with eyes open and is able to follow conversation, as demonstrated by periodic vigorous shaking of head to indicate a yes response to questions.  Of note, I initially spoke with the patient 10/18 to consent her for the procedure, at which time she was able to speak and was fully able to interact and maintain a conversation.   Recommend further stroke/neuro work-up at this time before proceeding with TEE, which was communicated to both the internal medicine attending/physician, as well as the nurse that was with the patient in the room at the time of my reevaluation.  I also communicated this to Dr. Garen Lah, rounding cardiologist.  Will postpone TEE, as it is not safe to proceed with TEE with moderate sedation given her current status.  For questions or updates, please contact [place] Please consult [email or web address] for contact info under [facility].     Signed, Arvil Chaco, PA-C  05/05/2020, 10:00 AM

## 2020-05-05 NOTE — Plan of Care (Signed)
Patient orientation status remains at baseline during shift. Patient blood sugar dropped while being NPO for ordered MRI after returning back on floor. MD placed new orders for temporary D5 infusion. Patient blood sugars are now WNL since hypoglycemic event. D5 has been discontinued and scheduled insulin orders have been re initiated. Patient remained asymptomatic. Husband at bedside updated on care. Will continue to monitor.  Problem: Education: Goal: Ability to describe self-care measures that may prevent or decrease complications (Diabetes Survival Skills Education) will improve Outcome: Progressing Goal: Individualized Educational Video(s) Outcome: Progressing   Problem: Coping: Goal: Ability to adjust to condition or change in health will improve Outcome: Progressing   Problem: Fluid Volume: Goal: Ability to maintain a balanced intake and output will improve Outcome: Progressing   Problem: Health Behavior/Discharge Planning: Goal: Ability to identify and utilize available resources and services will improve Outcome: Progressing Goal: Ability to manage health-related needs will improve Outcome: Progressing   Problem: Metabolic: Goal: Ability to maintain appropriate glucose levels will improve Outcome: Progressing   Problem: Nutritional: Goal: Maintenance of adequate nutrition will improve Outcome: Progressing Goal: Progress toward achieving an optimal weight will improve Outcome: Progressing   Problem: Skin Integrity: Goal: Risk for impaired skin integrity will decrease Outcome: Progressing   Problem: Tissue Perfusion: Goal: Adequacy of tissue perfusion will improve Outcome: Progressing   Problem: Education: Goal: Knowledge of General Education information will improve Description: Including pain rating scale, medication(s)/side effects and non-pharmacologic comfort measures Outcome: Progressing   Problem: Health Behavior/Discharge Planning: Goal: Ability to manage  health-related needs will improve Outcome: Progressing   Problem: Clinical Measurements: Goal: Ability to maintain clinical measurements within normal limits will improve Outcome: Progressing Goal: Will remain free from infection Outcome: Progressing Goal: Diagnostic test results will improve Outcome: Progressing Goal: Respiratory complications will improve Outcome: Progressing Goal: Cardiovascular complication will be avoided Outcome: Progressing   Problem: Activity: Goal: Risk for activity intolerance will decrease Outcome: Progressing   Problem: Nutrition: Goal: Adequate nutrition will be maintained Outcome: Progressing   Problem: Coping: Goal: Level of anxiety will decrease Outcome: Progressing   Problem: Elimination: Goal: Will not experience complications related to bowel motility Outcome: Progressing Goal: Will not experience complications related to urinary retention Outcome: Progressing   Problem: Pain Managment: Goal: General experience of comfort will improve Outcome: Progressing   Problem: Safety: Goal: Ability to remain free from injury will improve Outcome: Progressing   Problem: Skin Integrity: Goal: Risk for impaired skin integrity will decrease Outcome: Progressing

## 2020-05-05 NOTE — Progress Notes (Signed)
Physical Therapy Treatment Patient Details Name: Susan House MRN: [id number] DOB: 10/07/1955 Today's Date: 05/05/2020    History of Present Illness 64 y.o. female admitted on 04/27/2020 with bacteremia likely secondary to UTI. PMH for HTN, dyslipidemia, CVA and TIA. Patient was started on ceftriaxone and BCx 1/2 sets (both bottles) with GPC S. Aureus and epidermidis, methicillin resistance detected. Interestingly, culture has growing S. Aureus and E. Faecalis. Pharmacy has been consulted for vancomycin dosing.    PT Comments    Pt received in Semi-Fowler's position and agreeable to therapy.  Pt performed bed-level exercises and during assessment of functional motion of LEs, pt was found to have pain in calf with resisted plantarflexion.  Also noted warmness on posterior calf.  Pt presents with positive Homan's sign.  Communicated results with RN and Attending MD via secure chat.  Pt was able to perform bed-level exercises without much difficulty, however mobility deferred due to findings consistent with DVT.  Will continue therapy pending MD findings.  Current discharge plans to SNF remain appropriate at this time.  Pt will continue to benefit from skilled therapy in order to address deficits listed below.     Follow Up Recommendations  SNF     Equipment Recommendations  None recommended by PT    Recommendations for Other Services       Precautions / Restrictions Precautions Precautions: Fall Restrictions Weight Bearing Restrictions: No    Mobility  Bed Mobility                  Transfers                    Ambulation/Gait                 Stairs             Wheelchair Mobility    Modified Rankin (Stroke Patients Only)       Balance       Sitting balance - Comments: performed bed level exercises.                                    Cognition Arousal/Alertness: Awake/alert Behavior During Therapy: WFL for tasks  assessed/performed Overall Cognitive Status: Within Functional Limits for tasks assessed                                        Exercises General Exercises - Lower Extremity Ankle Circles/Pumps: AROM;Strengthening;Both;10 reps;Supine Quad Sets: AROM;Strengthening;Both;10 reps;Supine Gluteal Sets: AROM;Strengthening;Both;10 reps;Supine Hip ABduction/ADduction: AROM;Strengthening;Both;10 reps;Supine Straight Leg Raises: AROM;Strengthening;Both;10 reps;Supine Hip Flexion/Marching: AROM;Strengthening;Both;10 reps;Supine    General Comments        Pertinent Vitals/Pain Pain Assessment: No/denies pain    Home Living                      Prior Function            PT Goals (current goals can now be found in the care plan section) Acute Rehab PT Goals Patient Stated Goal: to go to rehab PT Goal Formulation: With patient/family Time For Goal Achievement: 05/07/20 Potential to Achieve Goals: Good Progress towards PT goals: Not progressing toward goals - comment (unable to assess mobility during treatment due to concerns of DVT.)    Frequency    Min 2X/week  PT Plan Current plan remains appropriate    Co-evaluation              AM-PAC PT "6 Clicks" Mobility   Outcome Measure  Help needed turning from your back to your side while in a flat bed without using bedrails?: A Lot Help needed moving from lying on your back to sitting on the side of a flat bed without using bedrails?: A Lot Help needed moving to and from a bed to a chair (including a wheelchair)?: Total Help needed standing up from a chair using your arms (e.g., wheelchair or bedside chair)?: Total Help needed to walk in hospital room?: Total Help needed climbing 3-5 steps with a railing? : Total 6 Click Score: 8    End of Session   Activity Tolerance: Patient limited by fatigue Patient left: with family/visitor present;with call bell/phone within reach;in bed;with bed alarm  set Nurse Communication: Mobility status;Other (comment) (bed needs changing, pure wick needs replacing) PT Visit Diagnosis: Muscle weakness (generalized) (M62.81);Difficulty in walking, not elsewhere classified (R26.2);History of falling (Z91.81);Unsteadiness on feet (R26.81)     Time: 8648-4720 PT Time Calculation (min) (ACUTE ONLY): 19 min  Charges:  $Therapeutic Exercise: 8-22 mins                      Gwenlyn Saran, PT, DPT 05/05/20, 5:08 PM

## 2020-05-05 NOTE — Progress Notes (Signed)
Nutrition Follow-Up Note   DOCUMENTATION CODES:   Obesity unspecified  INTERVENTION:   Nepro Shake po BID, each supplement provides 425 kcal and 19 grams protein  MVI with minerals daily  Dysphagia 2 diet requested in Health Touch   NUTRITION DIAGNOSIS:   Inadequate oral intake related to decreased appetite as evidenced by per patient/family report.  GOAL:   Patient will meet greater than or equal to 90% of their needs -progressing   MONITOR:   PO intake, Weight trends, Labs, I & O's, Supplement acceptance  ASSESSMENT:   64 year old female with history of CAD, chronic dizziness, DM, GERD, HTN, HLD, IBS, left-sided weakness s/p TIA, urinary incontinence presented with acute onset of altered mental status, decreased energy level and vomiting at home. Pt admitted for DKA secondary to poorly controlled DM2 and MRSA bacteremia of unknown source.   Met with pt in room today. Pt with worsening aphasia and unable to provide any history. Pt is able to shake her head "yes" and "no". Husband at bedside is unable to provide much nutrition related history either. Pt shakes her head that she is hungry today. Pt also shakes her head that she has been drinking some supplements. Husband at bedside also reports that pt has been drinking some of her supplements. Pt is documented to be eating 75-100% of meals. SLP recommended dysphagia 2 diet; will make changes in Health Touch to request this. No BM noted since admit; MD notified. Per chart, pt is up ~17lbs since admit. Plan is for TEE once medically appropriate.   Medications reviewed and include: aspirin, plavix, lovenox, insulin, lactulose, MVI, daptomycin, NaCl w/ 5% dextrose _0 /hr  Labs reviewed: Na 133(L), BUN 41(H), creat 1.96(H), ammonia 10- 10/19 Hgb 7.8(L), Hct 23.5(L) cbgs- 134, 64 x 24 hrs  Nutrition-Focused physical exam completed. Findings are mild fat depletions in the arms, mild muscle depletions in the clavicle and temporal  regions and mild edema.   Diet Order:   Diet Order            Diet Heart Room service appropriate? Yes; Fluid consistency: Thin  Diet effective now                EDUCATION NEEDS:   Not appropriate for education at this time  Skin:  Skin Assessment: Skin Integrity Issues: Skin Integrity Issues:: Other (Comment) Other: rash; bilateral breast;groin  Last BM:  pta  Height:   Ht Readings from Last 1 Encounters:  04/27/20 _1  (1.6 m)    Weight:   Wt Readings from Last 1 Encounters:  05/05/20 98.7 kg    BMI:  Body mass index is 38.53 kg/m.  Estimated Nutritional Needs:   Kcal:  1800-2000  Protein:  90-100  Fluid:  >/= 1.8 L/day  Koleen Distance MS, RD, LDN Please refer to [facility] for RD and/or RD on-call/weekend/after hours pager

## 2020-05-05 NOTE — Progress Notes (Signed)
ID Pt is more alert, follows commands  Has expressive and receptive dysphasia Fluctuating mental status and verbal capacity  Patient Vitals for the past 24 hrs:  BP Temp Temp src Pulse Resp SpO2 Weight  05/05/20 1624 (!) 152/73 99.8 F (37.7 C) Oral 88 20 98 % --  05/05/20 1225 (!) 172/66 -- Oral 89 18 99 % --  05/05/20 0805 (!) 149/102 98.8 F (37.1 C) Oral (!) 110 18 97 % --  05/05/20 0421 (!) 157/75 99.9 F (37.7 C) Oral (!) 109 16 96 % 98.7 kg  05/04/20 2300 (!) 157/84 98 F (36.7 C) Oral (!) 108 18 98 % --  05/04/20 2014 (!) 162/69 99.9 F (37.7 C) Oral (!) 105 17 98 % --   Chest b/l air entry Hs1s2 abd soft Cns left hemiparesis  Labs CBC Latest Ref Rng & Units 05/05/2020 05/04/2020 05/04/2020  WBC 4.0 - 10.5 K/uL 9.1 9.9 10.7(H)  Hemoglobin 12.0 - 15.0 g/dL 7.8(L) 8.4(L) 8.3(L)  Hematocrit 36 - 46 % 23.5(L) 25.4(L) 25.2(L)  Platelets 150 - 400 K/uL 283 302 287    CMP Latest Ref Rng & Units 05/04/2020 05/03/2020 05/02/2020  Glucose 70 - 99 mg/dL 193(H) 289(H) 290(H)  BUN 8 - 23 mg/dL 41(H) 38(H) 33(H)  Creatinine 0.44 - 1.00 mg/dL 1.96(H) 2.14(H) 1.65(H)  Sodium 135 - 145 mmol/L 133(L) 132(L) 132(L)  Potassium 3.5 - 5.1 mmol/L 4.0 4.0 4.1  Chloride 98 - 111 mmol/L 101 99 102  CO2 22 - 32 mmol/L 23 25 23   Calcium 8.9 - 10.3 mg/dL 8.2(L) 7.9(L) 7.9(L)  Total Protein 6.5 - 8.1 g/dL 6.0(L) - -  Total Bilirubin 0.3 - 1.2 mg/dL 0.7 - -  Alkaline Phos 38 - 126 U/L 50 - -  AST 15 - 41 U/L 24 - -  ALT 0 - 44 U/L 17 - -    Micro 04/27/20 BC- 1 set has MSSA, enterococcus fecalis, staph hominis, staph epidermidis, staph simulans   10/14 BC- NG  Impression/recommendation  Fluctuating mental statis and fluctuating aphasia  Goes from alert and fluent to having expressive and receptive dysphasia  Underlying CVA- with left hemiparesis- seen by neuro MRI done today shows No acute intracranial abnormality.2. Remote infarct involving the left lenticulo capsular region,  left thalamus and deep white matter of the left frontal lobe and in the posterior limb of the right internal capsule.3. 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 4. Mild chronic small vessel ischemia   Polymicrobial bacteremia-= 2 pathogens and 3 contaminants On daptomycin for MSSA and enterococcus fecalis bacteremia awaiting TEE ( once more alert) to r/o vegetation /PFO/thrombus especially with a calcified mitral valve  DKA on admission resolved   AKI on CKD- improving- off vanco  H/o falls  Discussed the management with her husband and care team

## 2020-05-05 NOTE — Evaluation (Signed)
Clinical/Bedside Swallow Evaluation Patient Details  Name: Susan House MRN: [id number] Date of Birth: May 23, 1956  Today's Date: 05/05/2020 Time: SLP Start Time (ACUTE ONLY): 1332 SLP Stop Time (ACUTE ONLY): 1601 SLP Time Calculation (min) (ACUTE ONLY): 15 min  Past Medical History:  Past Medical History:  Diagnosis Date  . Allergy   . Arthritis   . Atypical chest pain    a. Reportedly nl cath in the past; b. 12/2009 MV: No ischemia/infarct. EF 87%.  . Carotid arterial disease ([place])    a. 0/9323 CTA head/neck: LICA 55D, RICA 32-->[facility] rx.  . Cellulitis and abscess of face   . Chronic Dizziness   . Coronary artery calcification seen on CT scan    a. 10/2016 CTA chest: Atherosclerotic calcifications Ao, cors, and prox great vessels.  . Depression   . Diabetes mellitus ([place])   . Diastolic dysfunction    a. 2008 Echo: Nl EF; b. 2011 Echo: Nl EF; c. 06/2016 Echo: EF 55-60%, Gr1DD; d. 03/2018 Echo: EF 55-60%, no rwma, Gr1DD, mod dil LA.  . Edema   . GERD (gastroesophageal reflux disease)   . Headache   . Hematuria   . Hyperlipidemia   . Hypertension   . IBS (irritable bowel syndrome)   . Migraine   . Morbid obesity ([place])   . Nocturia   . Possible Seizure ([place])    a. 10/2016 ? sz. MRI neg for stroke.  . Stroke ([place]) 2000  . TIA (transient ischemic attack) 2010 & 2019   a. 2010; b. 03/2018 Left sided wkns-->MRI neg for stroke. CTA head/neck: Right A2 cut off/occlusion w/ collats extending over R cerebral convexity. LICA 54Y, RICA 45, RSubclavian 50->med rx.  . Urgency of micturation   . Urinary frequency   . Urinary incontinence    Past Surgical History:  Past Surgical History:  Procedure Laterality Date  . ABDOMINAL HYSTERECTOMY    . ABDOMINOPLASTY    . APPENDECTOMY    . BACK SURGERY     extensive spinal fusion  . carpel tunnel surgery    . CATARACT EXTRACTION     x 2  . CESAREAN SECTION    . CHOLECYSTECTOMY    . COLONOSCOPY  2010  . COLONOSCOPY WITH PROPOFOL N/A  07/11/2016   Procedure: COLONOSCOPY WITH PROPOFOL;  Surgeon: Lucilla Lame, MD;  Location: [facility] ENDOSCOPY;  Service: Endoscopy;  Laterality: N/A;  . COLONOSCOPY WITH PROPOFOL N/A 03/26/2018   Procedure: COLONOSCOPY WITH PROPOFOL;  Surgeon: Lucilla Lame, MD;  Location: [facility] ENDOSCOPY;  Service: Endoscopy;  Laterality: N/A;  . ESOPHAGOGASTRODUODENOSCOPY (EGD) WITH PROPOFOL N/A 03/26/2018   Procedure: ESOPHAGOGASTRODUODENOSCOPY (EGD) WITH PROPOFOL;  Surgeon: Lucilla Lame, MD;  Location: [facility] ENDOSCOPY;  Service: Endoscopy;  Laterality: N/A;  . EYE SURGERY    . HERNIA REPAIR    . SHOULDER SURGERY    . TOE SURGERY    . UPPER GI ENDOSCOPY    . WRIST FRACTURE SURGERY Left    HPI:  Susan House is a 64 y.o. female with a history of hypertension, CVA, diabetes mellitus with CKD, hyperlipidemia, seizure as late effect of CVA, being treated for recurrent UTI.fusion lumbar spine with hardware. Pt presented from home on 04/27/2020 by EMS for altered mental status, lethargy and weakness. Pt was vomiting at home.  CT head showed Generalized cerebral atrophy, redemonstrated large chronic infarct affecting the left basal ganglia and radiating white matter tracts, as well as ventral left   Assessment / Plan / Recommendation Clinical Impression  Pt presents with grossly adequate oropharyngeal abilities when consuming currently prescribed diet. She consumed puree, graham crackers and thin liquids via straw with no overt s/s of aspiration or dysphagia. At this time, continue to recommend currently prescribed diet. Of note, pt's cognitive linguistic abilities continue to unchanged from previous assessment by this writer on 04/28/2020. ST intervention is not indicated at this time.  SLP Visit Diagnosis: Dysphagia, oropharyngeal phase (R13.12)    Aspiration Risk  Mild aspiration risk    Diet Recommendation Regular;Thin liquid   Liquid Administration via: Cup;Straw Medication Administration: Whole meds with  puree Supervision: Patient able to self feed Compensations: Minimize environmental distractions;Slow rate;Small sips/bites Postural Changes: Seated upright at 90 degrees    Other  Recommendations Oral Care Recommendations: Oral care BID   Follow up Recommendations None      Frequency and Duration            Prognosis        Swallow Study   General Date of Onset: 04/27/20 HPI: Susan House is a 64 y.o. female with a history of hypertension, CVA, diabetes mellitus with CKD, hyperlipidemia, seizure as late effect of CVA, being treated for recurrent UTI.fusion lumbar spine with hardware. Pt presented from home on 04/27/2020 by EMS for altered mental status, lethargy and weakness. Pt was vomiting at home.  CT head showed Generalized cerebral atrophy, redemonstrated large chronic infarct affecting the left basal ganglia and radiating white matter tracts, as well as ventral left Type of Study: Bedside Swallow Evaluation Previous Swallow Assessment: BSE on 04/28/2020 Diet Prior to this Study: Regular;Thin liquids Temperature Spikes Noted: No Respiratory Status: Room air History of Recent Intubation: No Behavior/Cognition: Alert;Cooperative;Pleasant mood Oral Cavity Assessment: Within Functional Limits Oral Care Completed by SLP: No Oral Cavity - Dentition: Poor condition;Missing dentition Vision: Functional for self-feeding Self-Feeding Abilities: Needs set up Patient Positioning: Upright in bed Baseline Vocal Quality: Normal Volitional Cough: Strong Volitional Swallow: Able to elicit    Oral/Motor/Sensory Function Overall Oral Motor/Sensory Function: Within functional limits   Ice Chips Ice chips: Not tested   Thin Liquid Thin Liquid: Within functional limits Presentation: Self Fed;Straw;Cup    Nectar Thick Nectar Thick Liquid: Not tested   Honey Thick Honey Thick Liquid: Not tested   Puree Puree: Within functional limits Presentation: Spoon   Solid     Solid:  Within functional limits Presentation: Self Fed     Jamielyn Petrucci B. Rutherford Nail, M.S., CCC-SLP, [place] Pathologist Rehabilitation Services Office [place] 05/05/2020,3:55 PM

## 2020-05-05 NOTE — Progress Notes (Signed)
   05/05/20 1533  Clinical Encounter Type  Visited With Patient and family together  Visit Type Initial;Spiritual support;Social support  Referral From Chaplain  Consult/Referral To Chaplain  I made a visit to Pt and family while rounding unit. Pt husband told me that she is going to be here for a while. Pt and husband told me that their son is a Environmental education officer. I let them know that if they need me to just let the nurse know and I will come back. Ch will follow up with Pt.

## 2020-05-05 NOTE — Progress Notes (Signed)
PROGRESS NOTE    Susan House  QMV:[id number] DOB: October 28, 1955 DOA: 04/27/2020 PCP: Jerrol Banana., MD   Chief complaint.  Altered mental status.  Brief Narrative:  Susan House a64 y.o.Caucasian femalewith a known history of multiple medical problems including hypertension, dyslipidemia, CVA and TIA, who presented to the emergency room with acute onset of altered mental status. She has been having decreased energy level and vomiting at home. She is a very poor historian and has not been able to provide any meaningful history. She was tachycardic and tachypneic upon arrival. She was having extensive fecal material around her and several bruises.  Upon presentation to the emergency room blood pressure was167/92 with a heart rate of 126 and respiratory to 26 with pulse symmetry of 98% on room air.Labs revealed ABG with pH 7.37 and HCO3 of 12.7 and BMP revealed a CO2 of 14 with a blood glucose of 448 and a creatinine of 1.9 with anion gap of 23 magnesium of 1.3 and total bili of 2.1 with lactic acid of 2.1 later 2.3 and CBC showed leukocytosis 11.4 with relative neutrophilia. Beta hydroxybutyrate was 7.11. COVID-19 PCR and influenza antigens came back negative. UA showed more than 50 WBCs more than 300 protein and 20 ketones and more than 500 glucose.  The patient was given 2 g of IV magnesium sulfate, total bolus of IV lactated Ringer followed by 125 mL/h; of IV potassium chloride. She will be admitted to a stepdown unit for further evaluation and management.   Since admission to the hospital, she has positive urine culture showed multiple species, blood culture was positive with 4 different organisms including MSSA, Staph hominis, static coccus epidermidis and Enterococcus faecalis.  Patient has been seen by ID, presumed MSSA was true infection; others may be contaminants.  Repeated blood culture was negative.  Patient covered with daptomycin.  Also  recommended TEE from ID.  Patient also had DKA, require insulin drip.  Condition had improved.  Patient seem to have worsening aphasia since 10/19.  Was seen by neurology.  MRI of the brain is ordered 10/20.     Assessment & Plan:   Active Problems:   DKA (diabetic ketoacidosis) ([place])   Pressure injury of skin  #1.  Severe sepsis with MSSA septicemia.  POA. I reviewed the initial chart, at time of admission, patient had temperature of 100.5, tachypnea, she had acute renal failure with elevated creatinine, her lactic acid was 4.7. The source of infection is still unclear.  TEE is pending.  Discussed with cardiology, they prefer to wait for patient mental status improves before TEE.  I will contact him again after stroke work-up is completed as the patient has increased aphasia today. Appreciate ID input, continue daptomycin for now.  2.  Acute encephalopathy and aphasia. Appreciate neurology consult. EEG showed  moderate diffuse encephalopathy. Spoke with cardiology NP, she states that patient aphasia is much worse than Monday. Will obtain MRI to r/o stroke. Patient had history of CVA with residue aphasia.  3. 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 Renal function improving.  4. Hypokalemia. Improved.  5. Anemia of chronic disease.  Follow, transfuse as needed. No active bleeding.  6. Moderate protein calorie malnutrition. Albumin 2.8. Supplement.      DVT prophylaxis: Lovenox Code Status: full Family Communication: husband updated.   .   Status is: Inpatient  Remains inpatient appropriate because:Inpatient level of care appropriate due to severity of illness   Dispo: The patient is from:  Home              Anticipated d/c is to: SNF              Anticipated d/c date is: 2 days              Patient currently is not medically stable to d/c.        I/O last 3 completed shifts: In: 989.2 [P.O.:780; I.V.:209.2] Out: 1900 [Urine:1900] No intake/output data  recorded.     Consultants:   Neurology, ID and cardiology  Procedures: None  Antimicrobials:  Daptomycin  Subjective: Patient has severe expressive aphasia, worse than baseline. Confused, No shortness of breath or cough No abdominal pain, or nausea, vomiting. Did not have any dysuria hematuria. No fever chills. She did not have any headache or dizziness.  Objective: Vitals:   05/04/20 2014 05/04/20 2300 05/05/20 0421 05/05/20 0805  BP: (!) 162/69 (!) 157/84 (!) 157/75 (!) 149/102  Pulse: (!) 105 (!) 108 (!) 109 (!) 110  Resp: 17 18 16 18   Temp: 99.9 F (37.7 C) 98 F (36.7 C) 99.9 F (37.7 C) 98.8 F (37.1 C)  TempSrc: Oral Oral Oral Oral  SpO2: 98% 98% 96% 97%  Weight:   98.7 kg   Height:        Intake/Output Summary (Last 24 hours) at 05/05/2020 1052 Last data filed at 05/05/2020 0122 Gross per 24 hour  Intake 720 ml  Output 1300 ml  Net -580 ml   Filed Weights   05/01/20 0426 05/02/20 0418 05/05/20 0421  Weight: 99.2 kg 92.2 kg 98.7 kg    Examination:  General exam: Appears calm and comfortable  Respiratory system: Clear to auscultation. Respiratory effort normal. Cardiovascular system: S1 & S2 heard, RRR. No JVD, murmurs, rubs, gallops or clicks. No pedal edema. Gastrointestinal system: Abdomen is nondistended, soft and nontender. No organomegaly or masses felt. Normal bowel sounds heard. Central nervous system: Alert and oriented x1.  Severe expressive aphasia. Extremities: Symmetric  Skin: No rashes, lesions or ulcers     Data Reviewed: I have personally reviewed following labs and imaging studies  CBC: Recent Labs  Lab 04/29/20 1134 04/29/20 1134 04/30/20 0457 04/30/20 0457 05/01/20 0534 05/01/20 0534 05/02/20 0408 05/03/20 0318 05/04/20 0529 05/04/20 1349 05/05/20 0444  WBC 6.3   < > 6.6   < > 6.9   < > 7.9 9.2 10.7* 9.9 9.1  NEUTROABS 4.2  --  3.3  --  3.3  --  4.3 5.8  --   --   --   HGB 9.7*   < > 10.0*   < > 9.3*   < > 9.4*  9.7* 8.3* 8.4* 7.8*  HCT 30.0*   < > 30.3*   < > 28.3*   < > 28.2* 29.2* 25.2* 25.4* 23.5*  MCV 86.2   < > 85.4   < > 84.2   < > 83.2 84.4 84.3 84.1 84.8  PLT 242   < > 224   < > 203   < > 235 269 287 302 283   < > = values in this interval not displayed.   Basic Metabolic Panel: Recent Labs  Lab 04/28/20 1253 04/28/20 1253 04/29/20 0535 04/29/20 0535 04/30/20 0457 05/01/20 0534 05/02/20 0408 05/03/20 0318 05/04/20 0529  NA 143   < > 140   < > 136 133* 132* 132* 133*  K 3.1*   < > 4.6   < > 4.0 4.0 4.1 4.0  4.0  CL 107   < > 111   < > 107 103 102 99 101  CO2 21*   < > 20*   < > 19* 21* 23 25 23   GLUCOSE 195*   < > 169*   < > 173* 228* 290* 289* 193*  BUN 15   < > 15   < > 19 25* 33* 38* 41*  CREATININE 1.37*   < > 1.33*   < > 1.45* 1.65* 1.65* 2.14* 1.96*  CALCIUM 8.2*   < > 7.9*   < > 7.9* 8.2* 7.9* 7.9* 8.2*  MG 1.4*  --  1.9  --   --   --   --   --   --    < > = values in this interval not displayed.   GFR: Estimated Creatinine Clearance: 32.5 mL/min (A) (by C-G formula based on SCr of 1.96 mg/dL (H)). Liver Function Tests: Recent Labs  Lab 04/28/20 1253 05/04/20 1349  AST 27 24  ALT 13 17  ALKPHOS 55 50  BILITOT 1.0 0.7  PROT 6.0* 6.0*  ALBUMIN 2.9* 2.8*   No results for input(s): LIPASE, AMYLASE in the last 168 hours. Recent Labs  Lab 05/04/20 1349  AMMONIA 10   Coagulation Profile: Recent Labs  Lab 05/04/20 1349  INR 0.9   Cardiac Enzymes: Recent Labs  Lab 05/03/20 2310 05/04/20 0529  CKTOTAL 25* 32*   BNP (last 3 results) No results for input(s): PROBNP in the last 8760 hours. HbA1C: No results for input(s): HGBA1C in the last 72 hours. CBG: Recent Labs  Lab 05/04/20 0757 05/04/20 1217 05/04/20 1639 05/04/20 2109 05/05/20 0801  GLUCAP 170* 178* 143* 189* 134*   Lipid Profile: No results for input(s): CHOL, HDL, LDLCALC, TRIG, CHOLHDL, LDLDIRECT in the last 72 hours. Thyroid Function Tests: Recent Labs    05/04/20 1349  TSH 1.148    Anemia Panel: Recent Labs    05/04/20 1349  VITAMINB12 232   Sepsis Labs: Recent Labs  Lab 05/04/20 1349  LATICACIDVEN 0.7    Recent Results (from the past 240 hour(s))  Blood Culture (routine x 2)     Status: Abnormal   Collection Time: 04/27/20 12:55 AM   Specimen: BLOOD  Result Value Ref Range Status   Specimen Description   Final    BLOOD LEFT [facility] Performed at [facility], [address] [place], [place] [address]    Special Requests   Final    BOTTLES DRAWN AEROBIC AND ANAEROBIC Blood Culture results may not be optimal due to an excessive volume of blood received in culture bottles Performed at [facility], [address] [place], [place] [address]    Culture  Setup Time   Final    GRAM POSITIVE COCCI IN BOTH AEROBIC AND ANAEROBIC BOTTLES CRITICAL RESULT CALLED TO, READ BACK BY AND VERIFIED WITH: SUSAN WATSON AT 1761 ON 04/27/2020 [place].    Culture (A)  Final    STAPHYLOCOCCUS AUREUS ENTEROCOCCUS FAECALIS STAPHYLOCOCCUS SIMULANS STAPHYLOCOCCUS HOMINIS STAPHYLOCOCCUS EPIDERMIDIS ID BY BCID UNABLE TO RECOVER FROM CULTURE THE SIGNIFICANCE OF ISOLATING THIS ORGANISM FROM A SINGLE SET OF BLOOD CULTURES WHEN MULTIPLE SETS ARE DRAWN IS UNCERTAIN. PLEASE NOTIFY THE MICROBIOLOGY DEPARTMENT WITHIN ONE WEEK IF SPECIATION AND SENSITIVITIES ARE REQUIRED. Performed at [facility], [place] [address] [place], [place] [address]    Report Status 05/02/2020 FINAL  Final   Organism ID, Bacteria STAPHYLOCOCCUS AUREUS  Final   Organism ID, Bacteria  ENTEROCOCCUS FAECALIS  Final      Susceptibility   Enterococcus faecalis - MIC*    AMPICILLIN <=2 SENSITIVE Sensitive     VANCOMYCIN 1 SENSITIVE Sensitive     GENTAMICIN SYNERGY SENSITIVE Sensitive     * ENTEROCOCCUS FAECALIS   Staphylococcus aureus - MIC*    CIPROFLOXACIN >=8 RESISTANT Resistant     ERYTHROMYCIN >=8 RESISTANT Resistant     GENTAMICIN <=0.5 SENSITIVE Sensitive     OXACILLIN <=0.25  SENSITIVE Sensitive     TETRACYCLINE <=1 SENSITIVE Sensitive     VANCOMYCIN <=0.5 SENSITIVE Sensitive     TRIMETH/SULFA <=10 SENSITIVE Sensitive     CLINDAMYCIN RESISTANT Resistant     RIFAMPIN <=0.5 SENSITIVE Sensitive     Inducible Clindamycin POSITIVE Resistant     * STAPHYLOCOCCUS AUREUS  Blood Culture (routine x 2)     Status: None   Collection Time: 04/27/20 12:55 AM   Specimen: BLOOD  Result Value Ref Range Status   Specimen Description BLOOD RIGTH HAND  Final   Special Requests   Final    BOTTLES DRAWN AEROBIC AND ANAEROBIC Blood Culture adequate volume   Culture   Final    NO GROWTH 5 DAYS Performed at [facility], [address] [place],  [address]    Report Status 05/02/2020 FINAL  Final  Respiratory Panel by RT PCR (Flu A&B, Covid) - Nasopharyngeal Swab     Status: None   Collection Time: 04/27/20 12:55 AM   Specimen: Nasopharyngeal Swab  Result Value Ref Range Status   SARS Coronavirus 2 by RT PCR NEGATIVE NEGATIVE Final    Comment: (NOTE) SARS-CoV-2 target nucleic acids are NOT DETECTED.  The SARS-CoV-2 RNA is generally detectable in upper respiratoy specimens during the acute phase of infection. The lowest concentration of SARS-CoV-2 viral copies this assay can detect is 131 copies/mL. A negative result does not preclude SARS-Cov-2 infection and should not be used as the sole basis for treatment or other patient management decisions. A negative result may occur with  improper specimen collection/handling, submission of specimen other than nasopharyngeal swab, presence of viral mutation(s) within the areas targeted by this assay, and inadequate number of viral copies (<131 copies/mL). A negative result must be combined with clinical observations, patient history, and epidemiological information. The expected result is Negative.  Fact Sheet for Patients:  PinkCheek.be  Fact Sheet for Healthcare Providers:   GravelBags.it  This test is no t yet approved or cleared by the [place] FDA and  has been authorized for detection and/or diagnosis of SARS-CoV-2 by FDA under an Emergency Use Authorization (EUA). This EUA will remain  in effect (meaning this test can be used) for the duration of the COVID-19 declaration under Section 564(b)(1) of the Act, 21 U.S.C. section 360bbb-3(b)(1), unless the authorization is terminated or revoked sooner.     Influenza A by PCR NEGATIVE NEGATIVE Final   Influenza B by PCR NEGATIVE NEGATIVE Final    Comment: (NOTE) The Xpert Xpress SARS-CoV-2/FLU/RSV assay is intended as an aid in  the diagnosis of influenza from Nasopharyngeal swab specimens and  should not be used as a sole basis for treatment. Nasal washings and  aspirates are unacceptable for Xpert Xpress SARS-CoV-2/FLU/RSV  testing.  Fact Sheet for Patients: PinkCheek.be  Fact Sheet for Healthcare Providers: GravelBags.it  This test is not yet approved or cleared by the [place] FDA and  has been authorized for detection and/or diagnosis of SARS-CoV-2 by  FDA under an Emergency  Use Authorization (EUA). This EUA will remain  in effect (meaning this test can be used) for the duration of the  Covid-19 declaration under Section 564(b)(1) of the Act, 21  U.S.C. section 360bbb-3(b)(1), unless the authorization is  terminated or revoked. Performed at [facility], [address] [place], [place] [address]   Blood Culture ID Panel (Reflexed)     Status: Abnormal   Collection Time: 04/27/20 12:55 AM  Result Value Ref Range Status   Enterococcus faecalis NOT DETECTED NOT DETECTED Final   Enterococcus Faecium NOT DETECTED NOT DETECTED Final   Listeria monocytogenes NOT DETECTED NOT DETECTED Final   Staphylococcus species DETECTED (A) NOT DETECTED Final    Comment: CRITICAL RESULT CALLED TO, READ  BACK BY AND VERIFIED WITH: SUSAN WATSON AT 1424 ON 04/27/2020 [place].    Staphylococcus aureus (BCID) DETECTED (A) NOT DETECTED Final    Comment: CRITICAL RESULT CALLED TO, READ BACK BY AND VERIFIED WITH: SUSAN WATSON AT 1424 ON 04/27/2020 [place].    Staphylococcus epidermidis DETECTED (A) NOT DETECTED Final    Comment: CRITICAL RESULT CALLED TO, READ BACK BY AND VERIFIED WITH: SUSAN WATSON AT 1424 ON 04/27/2020 [place].    Staphylococcus lugdunensis NOT DETECTED NOT DETECTED Final   Streptococcus species NOT DETECTED NOT DETECTED Final   Streptococcus agalactiae NOT DETECTED NOT DETECTED Final   Streptococcus pneumoniae NOT DETECTED NOT DETECTED Final   Streptococcus pyogenes NOT DETECTED NOT DETECTED Final   A.calcoaceticus-baumannii NOT DETECTED NOT DETECTED Final   Bacteroides fragilis NOT DETECTED NOT DETECTED Final   Enterobacterales NOT DETECTED NOT DETECTED Final   Enterobacter cloacae complex NOT DETECTED NOT DETECTED Final   Escherichia coli NOT DETECTED NOT DETECTED Final   Klebsiella aerogenes NOT DETECTED NOT DETECTED Final   Klebsiella oxytoca NOT DETECTED NOT DETECTED Final   Klebsiella pneumoniae NOT DETECTED NOT DETECTED Final   Proteus species NOT DETECTED NOT DETECTED Final   Salmonella species NOT DETECTED NOT DETECTED Final   Serratia marcescens NOT DETECTED NOT DETECTED Final   Haemophilus influenzae NOT DETECTED NOT DETECTED Final   Neisseria meningitidis NOT DETECTED NOT DETECTED Final   Pseudomonas aeruginosa NOT DETECTED NOT DETECTED Final   Stenotrophomonas maltophilia NOT DETECTED NOT DETECTED Final   Candida albicans NOT DETECTED NOT DETECTED Final   Candida auris NOT DETECTED NOT DETECTED Final   Candida glabrata NOT DETECTED NOT DETECTED Final   Candida krusei NOT DETECTED NOT DETECTED Final   Candida parapsilosis NOT DETECTED NOT DETECTED Final   Candida tropicalis NOT DETECTED NOT DETECTED Final   Cryptococcus neoformans/gattii NOT DETECTED NOT DETECTED  Final   Methicillin resistance mecA/C DETECTED (A) NOT DETECTED Final    Comment: CRITICAL RESULT CALLED TO, READ BACK BY AND VERIFIED WITH: SUSAN WATSON AT 1424 ON 04/27/2020 [place].    Meth resistant mecA/C and MREJ NOT DETECTED NOT DETECTED Final    Comment: Performed at [facility], [address] [place], [place] [address]  Urine culture     Status: Abnormal   Collection Time: 04/27/20  1:57 AM   Specimen: In/Out Cath Urine  Result Value Ref Range Status   Specimen Description   Final    IN/OUT CATH URINE Performed at [facility], [address], [place], [place] [address]    Special Requests   Final    NONE Performed at [facility], [address] [place], [place] [address]    Culture MULTIPLE SPECIES PRESENT, SUGGEST RECOLLECTION (A)  Final   Report Status  04/27/2020 FINAL  Final  CULTURE, BLOOD (ROUTINE X 2) w Reflex to ID Panel     Status: None   Collection Time: 04/29/20  7:05 PM   Specimen: BLOOD  Result Value Ref Range Status   Specimen Description BLOOD BLOOD RIGHT HAND  Final   Special Requests   Final    BOTTLES DRAWN AEROBIC AND ANAEROBIC Blood Culture adequate volume   Culture   Final    NO GROWTH 5 DAYS Performed at [facility], [address] [place], [place] [address]    Report Status 05/04/2020 FINAL  Final  CULTURE, BLOOD (ROUTINE X 2) w Reflex to ID Panel     Status: None   Collection Time: 04/29/20  7:06 PM   Specimen: BLOOD  Result Value Ref Range Status   Specimen Description BLOOD RIGHT ANTECUBITAL  Final   Special Requests   Final    BOTTLES DRAWN AEROBIC AND ANAEROBIC Blood Culture results may not be optimal due to an excessive volume of blood received in culture bottles   Culture   Final    NO GROWTH 5 DAYS Performed at [facility], [address], [place], [place] [address]    Report Status 05/04/2020 FINAL  Final         Radiology Studies: EEG  Result Date: 05/04/2020 Lora Havens, MD     05/04/2020  1:37 PM Patient Name: Blessyn [place] MRN: [id number] Epilepsy Attending: Lora Havens Referring Physician/Provider: Dr Laurey Arrow Date: 05/04/2020 Duration: 22.29 minutes Patient history: 64 year old female with altered mental status.  EEG to evaluate for seizures. Level of alertness: Awake/ lethargic AEDs during EEG study: LEV Technical aspects: This EEG study was done with scalp electrodes positioned according to the 10-20 International system of electrode placement. Electrical activity was acquired at a sampling rate of 500Hz  and reviewed with a high frequency filter of 70Hz  and a low frequency filter of 1Hz . EEG data were recorded continuously and digitally stored. Description: No clear posterior dominant rhythm seen.  EEG showed continuous generalized polymorphic 3 to 6 Hz theta-delta slowing.  Patient was noted to have right arm tremor-like movement with concomitant EEG before, during and after the event did not show any EEG changes to suggest seizure.  Hyperventilation and photic stimulation were not performed.   ABNORMALITY -Continuous slow, generalized IMPRESSION: This study is suggestive of moderate diffuse encephalopathy of nonspecific etiology.  Patient was noted to have right arm tremor like movement without concomitant EEG changes and was likely not epileptic.  No seizures or epileptiform discharges were seen throughout the recording. Lora Havens   CT HEAD WO CONTRAST  Result Date: 05/04/2020 CLINICAL DATA:  Mental status change EXAM: CT HEAD WITHOUT CONTRAST TECHNIQUE: Contiguous axial images were obtained from the base of the skull through the vertex without intravenous contrast. COMPARISON:  04/27/2020 FINDINGS: Brain: There is no acute intracranial hemorrhage, mass effect, or edema. No new loss of gray-white differentiation. Chronic infarction of the left basal ganglia and adjacent white matter is again identified. Additional patchy and confluent  areas of hypoattenuation in the supratentorial white matter likely reflect stable chronic microvascular ischemic changes. There is no extra-axial fluid collection. Ventricles and sulci are stable size and configuration. Vascular: There is atherosclerotic calcification at the skull base. Skull: Calvarium is unremarkable. Sinuses/Orbits: No acute finding. Other: None. IMPRESSION: No acute intracranial abnormality. No significant change since 04/27/2020. Electronically Signed   By: Macy Mis M.D.   On: 05/04/2020 12:00  Scheduled Meds: . amLODipine  10 mg Oral Daily  . aspirin  81 mg Oral Daily  . atorvastatin  20 mg Oral q1800  . Chlorhexidine Gluconate Cloth  6 each Topical Daily  . clopidogrel  75 mg Oral Daily  . feeding supplement (NEPRO CARB STEADY)  237 mL Oral BID BM  . hydrALAZINE  100 mg Oral BID  . insulin aspart  0-9 Units Subcutaneous TID WC  . insulin aspart  5 Units Subcutaneous TID WC  . insulin glargine  22 Units Subcutaneous QHS  . levETIRAcetam  500 mg Oral BID  . metoprolol succinate  100 mg Oral Daily  . multivitamin with minerals  1 tablet Oral Daily  . sodium chloride flush  10-40 mL Intracatheter Q12H   Continuous Infusions: . sodium chloride 100 mL/hr at 05/05/20 0448  . DAPTOmycin (CUBICIN)  IV 500 mg (05/04/20 1345)     LOS: 8 days    Time spent: 45 minutes    Sharen Hones, MD [facility]   To contact the attending provider between 7A-7P or the covering provider during after hours 7P-7A, please log into the web site [email or web address] and access using universal [place] password for that web site. If you do not have the password, please call the hospital operator.  05/05/2020, 10:52 AM

## 2020-05-06 ENCOUNTER — Other Ambulatory Visit: Payer: Self-pay | Admitting: Family Medicine

## 2020-05-06 DIAGNOSIS — N390 Urinary tract infection, site not specified: Secondary | ICD-10-CM | POA: Diagnosis not present

## 2020-05-06 DIAGNOSIS — A4101 Sepsis due to Methicillin susceptible Staphylococcus aureus: Secondary | ICD-10-CM

## 2020-05-06 DIAGNOSIS — Z794 Long term (current) use of insulin: Secondary | ICD-10-CM

## 2020-05-06 DIAGNOSIS — E1142 Type 2 diabetes mellitus with diabetic polyneuropathy: Secondary | ICD-10-CM | POA: Diagnosis not present

## 2020-05-06 DIAGNOSIS — R4701 Aphasia: Secondary | ICD-10-CM | POA: Diagnosis not present

## 2020-05-06 DIAGNOSIS — N171 Acute kidney failure with acute cortical necrosis: Secondary | ICD-10-CM | POA: Diagnosis not present

## 2020-05-06 DIAGNOSIS — I25118 Atherosclerotic heart disease of native coronary artery with other forms of angina pectoris: Secondary | ICD-10-CM

## 2020-05-06 DIAGNOSIS — N1831 Chronic kidney disease, stage 3a: Secondary | ICD-10-CM

## 2020-05-06 DIAGNOSIS — R6520 Severe sepsis without septic shock: Secondary | ICD-10-CM

## 2020-05-06 DIAGNOSIS — Z8673 Personal history of transient ischemic attack (TIA), and cerebral infarction without residual deficits: Secondary | ICD-10-CM | POA: Diagnosis not present

## 2020-05-06 DIAGNOSIS — I739 Peripheral vascular disease, unspecified: Secondary | ICD-10-CM

## 2020-05-06 LAB — BASIC METABOLIC PANEL
Anion gap: 12 (ref 5–15)
BUN: 32 mg/dL — ABNORMAL HIGH (ref 8–23)
CO2: 20 mmol/L — ABNORMAL LOW (ref 22–32)
Calcium: 7.9 mg/dL — ABNORMAL LOW (ref 8.9–10.3)
Chloride: 103 mmol/L (ref 98–111)
Creatinine, Ser: 1.73 mg/dL — ABNORMAL HIGH (ref 0.44–1.00)
GFR, Estimated: 31 mL/min — ABNORMAL LOW (ref >60)
Glucose, Bld: 264 mg/dL — ABNORMAL HIGH (ref 70–99)
Potassium: 4.1 mmol/L (ref 3.5–5.1)
Sodium: 135 mmol/L (ref 135–145)

## 2020-05-06 LAB — PTT FACTOR INHIBITOR (MIXING STUDY): aPTT: 24.4 s (ref 22.9–30.2)

## 2020-05-06 LAB — CBC
HCT: 25.2 % — ABNORMAL LOW (ref 36.0–46.0)
Hemoglobin: 8.3 g/dL — ABNORMAL LOW (ref 12.0–15.0)
MCH: 28.2 pg (ref 26.0–34.0)
MCHC: 32.9 g/dL (ref 30.0–36.0)
MCV: 85.7 fL (ref 80.0–100.0)
Platelets: 310 10*3/uL (ref 150–400)
RBC: 2.94 MIL/uL — ABNORMAL LOW (ref 3.87–5.11)
RDW: 15.5 % (ref 11.5–15.5)
WBC: 11.2 10*3/uL — ABNORMAL HIGH (ref 4.0–10.5)
nRBC: 0 % (ref 0.0–0.2)

## 2020-05-06 LAB — MAGNESIUM: Magnesium: 1.7 mg/dL (ref 1.7–2.4)

## 2020-05-06 LAB — GLUCOSE, CAPILLARY
Glucose-Capillary: 245 mg/dL — ABNORMAL HIGH (ref 70–99)
Glucose-Capillary: 206 mg/dL — ABNORMAL HIGH (ref 70–99)
Glucose-Capillary: 330 mg/dL — ABNORMAL HIGH (ref 70–99)
Glucose-Capillary: 298 mg/dL — ABNORMAL HIGH (ref 70–99)

## 2020-05-06 MED ORDER — ENALAPRIL MALEATE 20 MG PO TABS
20.0000 mg | ORAL_TABLET | Freq: Two times a day (BID) | ORAL | Status: DC
  Administered 2020-05-06 – 2020-05-09 (×6): 20 mg via ORAL
  Filled 2020-05-06 (×7): qty 1

## 2020-05-06 MED ORDER — SODIUM CHLORIDE 0.9 % IV SOLN
300.0000 mg | Freq: Once | INTRAVENOUS | Status: AC
  Administered 2020-05-06: 300 mg via INTRAVENOUS
  Filled 2020-05-06: qty 15

## 2020-05-06 MED ORDER — SODIUM CHLORIDE 0.9 % IV SOLN: INTRAVENOUS | Status: DC

## 2020-05-06 NOTE — Progress Notes (Signed)
MRI completed, revealing no acute stroke. There is a remote infarct involving the left lenticulo capsular region, left thalamus and deep white matter of the left frontal lobe and in the posterior limb of the right internal capsule. Diffuse atrophy and mild chronic small vessel ischemia are also noted.  Cardiology is planning for TEE.   Electronically signed: Dr. Kerney Elbe

## 2020-05-06 NOTE — Progress Notes (Signed)
   05/06/20 0809  Assess: MEWS Score  Temp 99.1 F (37.3 C)  BP (!) 174/85  Pulse Rate (!) 113  Resp 18  Level of Consciousness Alert  SpO2 98 %  O2 Device Room Air  Assess: MEWS Score  MEWS Temp 0  MEWS Systolic 0  MEWS Pulse 2  MEWS RR 0  MEWS LOC 0  MEWS Score 2  MEWS Score Color Yellow  Assess: if the MEWS score is Yellow or Red  Were vital signs taken at a resting state? Yes  Focused Assessment No change from prior assessment  Early Detection of Sepsis Score *See Row Information* Low  MEWS guidelines implemented *See Row Information* Yes  Treat  MEWS Interventions Escalated (See documentation below)  Pain Scale 0-10  Pain Score 0  Take Vital Signs  Increase Vital Sign Frequency  Yellow: Q 2hr X 2 then Q 4hr X 2, if remains yellow, continue Q 4hrs  Escalate  MEWS: Escalate Yellow: discuss with charge nurse/RN and consider discussing with provider and RRT  Notify: Charge Nurse/RN  Name of Charge Nurse/RN Notified Evette, RN  Date Charge Nurse/RN Notified 05/06/20  Time Charge Nurse/RN Notified 1000  Document  Patient Outcome Other (Comment) (Continue to monitor)  Progress note created (see row info) Yes

## 2020-05-06 NOTE — Progress Notes (Signed)
PT Cancellation Note  Patient Details Name: Susan House MRN: [id number] DOB: Oct 11, 1955   Cancelled Treatment:    Reason Eval/Treat Not Completed: Other (comment) Spoke with nursing who reports that pt is eating dinner and now would not be a good time to try and see her.  Will maintain on PT caseload and continue to follow as appropriate.  Kreg Shropshire, DPT 05/06/2020, 5:13 PM

## 2020-05-06 NOTE — Progress Notes (Signed)
Progress Note  Patient Name: Susan House Date of Encounter: 05/06/2020  Primary Cardiologist: Rockey Situ  Subjective   More lucid today.  Family at bedside and notes significant improvement in the patient over the past 24 to 48 hours.  No chest pain or dyspnea.  Inpatient Medications    Scheduled Meds: . amLODipine  10 mg Oral Daily  . aspirin  81 mg Oral Daily  . atorvastatin  20 mg Oral q1800  . Chlorhexidine Gluconate Cloth  6 each Topical Daily  . clopidogrel  75 mg Oral Daily  . enoxaparin (LOVENOX) injection  40 mg Subcutaneous Q24H  . feeding supplement (NEPRO CARB STEADY)  237 mL Oral BID BM  . hydrALAZINE  100 mg Oral BID  . insulin aspart  0-9 Units Subcutaneous TID WC  . insulin aspart  5 Units Subcutaneous TID WC  . insulin glargine  22 Units Subcutaneous QHS  . levETIRAcetam  500 mg Oral BID  . metoprolol succinate  100 mg Oral Daily  . multivitamin with minerals  1 tablet Oral Daily  . sodium chloride flush  10-40 mL Intracatheter Q12H   Continuous Infusions: . DAPTOmycin (CUBICIN)  IV 500 mg (05/05/20 2021)   PRN Meds: acetaminophen, alum & mag hydroxide-simeth, dextrose, docusate sodium, hydrALAZINE, labetalol, nitroGLYCERIN, ondansetron (ZOFRAN) IV, sodium chloride flush   Vital Signs    Vitals:   05/06/20 0421 05/06/20 0809 05/06/20 1000 05/06/20 1110  BP: 133/60 (!) 174/85 (!) 175/64 (!) 176/71  Pulse: 92 (!) 113 (!) 107 91  Resp: 19 18 20 18   Temp: 99 F (37.2 C) 99.1 F (37.3 C) 99.1 F (37.3 C) 98.9 F (37.2 C)  TempSrc: Oral Oral Oral Oral  SpO2: 99% 98% 99% 98%  Weight: 98.8 kg     Height:        Intake/Output Summary (Last 24 hours) at 05/06/2020 1157 Last data filed at 05/06/2020 1100 Gross per 24 hour  Intake 240 ml  Output 1075 ml  Net -835 ml   Filed Weights   05/02/20 0418 05/05/20 0421 05/06/20 0421  Weight: 92.2 kg 98.7 kg 98.8 kg    Telemetry    SR, 100s bpm - Personally Reviewed  ECG    No new  tracings - Personally Reviewed  Physical Exam   GEN: No acute distress.   Neck: No JVD. Cardiac: RRR, no murmurs, rubs, or gallops.  Respiratory: Clear to auscultation bilaterally.  GI: Soft, nontender, non-distended.   MS: No edema; No deformity. Neuro:  Alert and oriented x 3.  Expressive aphasia. Psych: Normal affect.  Labs    Chemistry Recent Labs  Lab 05/03/20 0318 05/04/20 0529 05/04/20 1349 05/06/20 0430  NA 132* 133*  --  135  K 4.0 4.0  --  4.1  CL 99 101  --  103  CO2 25 23  --  20*  GLUCOSE 289* 193*  --  264*  BUN 38* 41*  --  32*  CREATININE 2.14* 1.96*  --  1.73*  CALCIUM 7.9* 8.2*  --  7.9*  PROT  --   --  6.0*  --   ALBUMIN  --   --  2.8*  --   AST  --   --  24  --   ALT  --   --  17  --   ALKPHOS  --   --  50  --   BILITOT  --   --  0.7  --   [facility]  24* 26*  --  31*  ANIONGAP 8 9  --  12     Hematology Recent Labs  Lab 05/04/20 1349 05/05/20 0444 05/06/20 0430  WBC 9.9 9.1 11.2*  RBC 3.02* 2.77* 2.94*  HGB 8.4* 7.8* 8.3*  HCT 25.4* 23.5* 25.2*  MCV 84.1 84.8 85.7  MCH 27.8 28.2 28.2  MCHC 33.1 33.2 32.9  RDW 15.5 15.8* 15.5  PLT 302 283 310    Cardiac EnzymesNo results for input(s): TROPONINI in the last 168 hours. No results for input(s): TROPIPOC in the last 168 hours.   BNPNo results for input(s): BNP, PROBNP in the last 168 hours.   DDimer No results for input(s): DDIMER in the last 168 hours.   Radiology    EEG  Result Date: 05/04/2020 IMPRESSION: This study is suggestive of moderate diffuse encephalopathy of nonspecific etiology.  Patient was noted to have right arm tremor like movement without concomitant EEG changes and was likely not epileptic.  No seizures or epileptiform discharges were seen throughout the recording. Lora Havens   MR BRAIN WO CONTRAST  Result Date: 05/05/2020 IMPRESSION: 1. No acute intracranial abnormality. 2. Remote infarct involving the left lenticulo capsular region, left thalamus and deep  white matter of the left frontal lobe and in the posterior limb of the right internal capsule. 3. 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 4. Mild chronic small vessel ischemia. Electronically Signed   By: Pedro Earls M.D.   On: 05/05/2020 12:34    Cardiac Studies   2D echo 04/28/2020: 1. Left ventricular ejection fraction, by estimation, is 65 to 70%. The  left ventricle has normal function. The left ventricle has no regional  wall motion abnormalities. Left ventricular diastolic parameters are  consistent with Grade III diastolic  dysfunction (restrictive).  2. Right ventricular systolic function is normal. The right ventricular  size is normal.  3. The mitral valve is normal in structure. Trivial mitral valve  regurgitation.  4. The aortic valve is normal in structure. Aortic valve regurgitation is  not visualized.   Patient Profile     64 y.o. female with history of CVA/TIA, HTN, and HLD who we are seeing for evaluation of TEE.  Assessment & Plan    1.  MSSA bacteremia/sepsis: -Initially, TEE has been deferred secondary to underlying encephalopathy -Patient is significantly improved today and much more lucid, family member at bedside agrees -MRI on 10/20 without acute intracranial abnormality with remote infarct noted -After rounding with MD we will plan for the patient to be n.p.o. overnight with potential TEE on 10/22 - The risks and benefits of transesophageal echocardiogram have been explained including risks of esophageal damage, perforation (1:10,000 risk), bleeding, pharyngeal hematoma as well as other potential complications associated with conscious sedation including aspiration, arrhythmia, respiratory failure and death. Alternatives to treatment were discussed, questions were answered. Patient is willing to proceed   For questions or updates, please  contact [facility] Please consult [email or web address] for contact info under Cardiology/STEMI.    Signed, Christell Faith, PA-C [place] Pager: [phone] 05/06/2020, 11:57 AM

## 2020-05-06 NOTE — Plan of Care (Signed)

## 2020-05-06 NOTE — Progress Notes (Addendum)
PROGRESS NOTE    Susan House  XNA:[id number] DOB: June 14, 1956 DOA: 04/27/2020 PCP: Jerrol Banana., MD   Chief complaint.  Altered mental status.  Brief Narrative:  Susan House a64 y.o.Caucasian femalewith a known history of multiple medical problems including hypertension, dyslipidemia, CVA and TIA, who presented to the emergency room with acute onset of altered mental status. She has been having decreased energy level and vomiting at home. She was tachycardic and tachypneic upon arrival. She was having extensive fecal material around her and several bruises.  Upon presentation to the emergency room blood pressure was167/92 with a heart rate of 126 and respiratory to 26 with pulse symmetry of 98% on room air.Labs revealed ABG with pH 7.37 and HCO3 of 12.7 and BMP revealed a CO2 of 14 with a blood glucose of 448 and a creatinine of 1.9 with anion gap of 23 magnesium of 1.3 and total bili of 2.1 with lactic acid of 2.1 later 2.3 and CBC showed leukocytosis 11.4 with relative neutrophilia. Beta hydroxybutyrate was 7.11. COVID-19 PCR and influenza antigens came back negative. UA showed more than 50 WBCs more than 300 protein and 20 ketones and more than 500 glucose.   Since admission to the hospital, she has positive urine culture showed multiple species, blood culture was positive with 4 different organisms including MSSA,  Staph hominis, static coccus epidermidis and Enterococcus faecalis.  Patient has been seen by ID, presumed MSSA, Enterococcus faecalis are true infections; others may be contaminants.  Repeated blood culture was negative.  Patient covered with daptomycin.  Also recommended TEE from ID.  Patient also had DKA, require insulin drip.  Condition had improved.  Patient seem to have worsening aphasia since 10/19.  Was seen by neurology.  MRI of the brain is negative for acute stroke.   Assessment & Plan:   Active Problems:   DKA (diabetic  ketoacidosis) ([place])   Pressure injury of skin   Severe sepsis with acute organ dysfunction due to methicillin susceptible Staphylococcus aureus (MSSA) ([place])   Aphasia  #1.  Severe sepsis with MSSA and Enterococcus septicemia. Condition had improved.  Source of infection is unclear. TEE is scheduled for tomorrow per cardiology. Appreciate ID input.  Continue daptomycin.  Patient already has a PICC line placed since Monday.  2.  Acute encephalopathy and aphasia. Aphasia appears to be secondary to prior stroke.  Reviewed MRI results, no acute stroke.  Patient mental status seem to be improving.  3.  Acute kidney injury on chronic kidney disease stage IIIa. Renal function improved  4.  Hypokalemia. Improved.  5.  Anemia of chronic disease with iron deficient anemia.  B12 level borderline, homocystine level normal. We will give IV iron.  6.  Moderate protein and calorie malnutrition. Continue supplement.  7.  DKA.  Resolved.      DVT prophylaxis: Lovenox Code Status: Full Family Communication: Niece in room, updated. Disposition Plan:  .   Status is: Inpatient  Remains inpatient appropriate because:Inpatient level of care appropriate due to severity of illness   Dispo: The patient is from: Home              Anticipated d/c is to: SNF              Anticipated d/c date is: 2 days              Patient currently is not medically stable to d/c.        I/O last 3 completed  shifts: In: 120 [P.O.:120] Out: 875 [Urine:875] Total I/O In: 480 [P.O.:480] Out: 300 [Urine:300]     Consultants:   Neurology, ID  Procedures: TEE tomorrow  Antimicrobials: Daptomycin.  Subjective: Patient still has significant aphasia, but was able to speak a few words. She does not have any confusion. She denies any fever or chills.   No headache or dizziness. No short of breath or cough. No abdominal pain nausea vomiting.   Objective: Vitals:   05/06/20 0421 05/06/20 0809  05/06/20 1000 05/06/20 1110  BP: 133/60 (!) 174/85 (!) 175/64 (!) 176/71  Pulse: 92 (!) 113 (!) 107 91  Resp: 19 18 20 18   Temp: 99 F (37.2 C) 99.1 F (37.3 C) 99.1 F (37.3 C) 98.9 F (37.2 C)  TempSrc: Oral Oral Oral Oral  SpO2: 99% 98% 99% 98%  Weight: 98.8 kg     Height:        Intake/Output Summary (Last 24 hours) at 05/06/2020 1445 Last data filed at 05/06/2020 1400 Gross per 24 hour  Intake 480 ml  Output 1075 ml  Net -595 ml   Filed Weights   05/02/20 0418 05/05/20 0421 05/06/20 0421  Weight: 92.2 kg 98.7 kg 98.8 kg    Examination:  General exam: Appears calm and comfortable  Respiratory system: Clear to auscultation. Respiratory effort normal. Cardiovascular system: S1 & S2 heard, RRR. No JVD, murmurs, rubs, gallops or clicks. No pedal edema. Gastrointestinal system: Abdomen is nondistended, soft and nontender. No organomegaly or masses felt. Normal bowel sounds heard. Central nervous system: Alert and oriented x3. No focal neurological deficits. Extremities: Symmetric 5 x 5 power. Skin: No rashes, lesions or ulcers Psychiatry:  Mood & affect appropriate.     Data Reviewed: I have personally reviewed following labs and imaging studies  CBC: Recent Labs  Lab 04/30/20 0457 04/30/20 0457 05/01/20 0534 05/01/20 0534 05/02/20 0408 05/02/20 0408 05/03/20 0318 05/04/20 0529 05/04/20 1349 05/05/20 0444 05/06/20 0430  WBC 6.6   < > 6.9   < > 7.9   < > 9.2 10.7* 9.9 9.1 11.2*  NEUTROABS 3.3  --  3.3  --  4.3  --  5.8  --   --   --   --   HGB 10.0*   < > 9.3*   < > 9.4*   < > 9.7* 8.3* 8.4* 7.8* 8.3*  HCT 30.3*   < > 28.3*   < > 28.2*   < > 29.2* 25.2* 25.4* 23.5* 25.2*  MCV 85.4   < > 84.2   < > 83.2   < > 84.4 84.3 84.1 84.8 85.7  PLT 224   < > 203   < > 235   < > 269 287 302 283 310   < > = values in this interval not displayed.   Basic Metabolic Panel: Recent Labs  Lab 05/01/20 0534 05/02/20 0408 05/03/20 0318 05/04/20 0529 05/06/20 0430  NA  133* 132* 132* 133* 135  K 4.0 4.1 4.0 4.0 4.1  CL 103 102 99 101 103  CO2 21* 23 25 23  20*  GLUCOSE 228* 290* 289* 193* 264*  BUN 25* 33* 38* 41* 32*  CREATININE 1.65* 1.65* 2.14* 1.96* 1.73*  CALCIUM 8.2* 7.9* 7.9* 8.2* 7.9*  MG  --   --   --   --  1.7   GFR: Estimated Creatinine Clearance: 36.8 mL/min (A) (by C-G formula based on SCr of 1.73 mg/dL (H)). Liver Function Tests: Recent Labs  Lab  05/04/20 1349  AST 24  ALT 17  ALKPHOS 50  BILITOT 0.7  PROT 6.0*  ALBUMIN 2.8*   No results for input(s): LIPASE, AMYLASE in the last 168 hours. Recent Labs  Lab 05/04/20 1349  AMMONIA 10   Coagulation Profile: Recent Labs  Lab 05/04/20 1349  INR 0.9   Cardiac Enzymes: Recent Labs  Lab 05/03/20 2310 05/04/20 0529  CKTOTAL 25* 32*   BNP (last 3 results) No results for input(s): PROBNP in the last 8760 hours. HbA1C: No results for input(s): HGBA1C in the last 72 hours. CBG: Recent Labs  Lab 05/05/20 1334 05/05/20 1627 05/05/20 2030 05/06/20 0806 05/06/20 1111  GLUCAP 153* 277* 238* 245* 206*   Lipid Profile: No results for input(s): CHOL, HDL, LDLCALC, TRIG, CHOLHDL, LDLDIRECT in the last 72 hours. Thyroid Function Tests: Recent Labs    05/04/20 1349  TSH 1.148   Anemia Panel: Recent Labs    05/04/20 1349  VITAMINB12 232   Sepsis Labs: Recent Labs  Lab 05/04/20 1349  LATICACIDVEN 0.7    Recent Results (from the past 240 hour(s))  Blood Culture (routine x 2)     Status: Abnormal   Collection Time: 04/27/20 12:55 AM   Specimen: BLOOD  Result Value Ref Range Status   Specimen Description   Final    BLOOD LEFT [facility] Performed at [facility], [address], [place], [place] [address]    Special Requests   Final    BOTTLES DRAWN AEROBIC AND ANAEROBIC Blood Culture results may not be optimal due to an excessive volume of blood received in culture bottles Performed at [facility], [address] [place], [place] [address]     Culture  Setup Time   Final    GRAM POSITIVE COCCI IN BOTH AEROBIC AND ANAEROBIC BOTTLES CRITICAL RESULT CALLED TO, READ BACK BY AND VERIFIED WITH: SUSAN WATSON AT 0973 ON 04/27/2020 [place].    Culture (A)  Final    STAPHYLOCOCCUS AUREUS ENTEROCOCCUS FAECALIS STAPHYLOCOCCUS SIMULANS STAPHYLOCOCCUS HOMINIS STAPHYLOCOCCUS EPIDERMIDIS ID BY BCID UNABLE TO RECOVER FROM CULTURE THE SIGNIFICANCE OF ISOLATING THIS ORGANISM FROM A SINGLE SET OF BLOOD CULTURES WHEN MULTIPLE SETS ARE DRAWN IS UNCERTAIN. PLEASE NOTIFY THE MICROBIOLOGY DEPARTMENT WITHIN ONE WEEK IF SPECIATION AND SENSITIVITIES ARE REQUIRED. Performed at [facility], [place] [address], [place], [place] [address]    Report Status 05/02/2020 FINAL  Final   Organism ID, Bacteria STAPHYLOCOCCUS AUREUS  Final   Organism ID, Bacteria ENTEROCOCCUS FAECALIS  Final      Susceptibility   Enterococcus faecalis - MIC*    AMPICILLIN <=2 SENSITIVE Sensitive     VANCOMYCIN 1 SENSITIVE Sensitive     GENTAMICIN SYNERGY SENSITIVE Sensitive     * ENTEROCOCCUS FAECALIS   Staphylococcus aureus - MIC*    CIPROFLOXACIN >=8 RESISTANT Resistant     ERYTHROMYCIN >=8 RESISTANT Resistant     GENTAMICIN <=0.5 SENSITIVE Sensitive     OXACILLIN <=0.25 SENSITIVE Sensitive     TETRACYCLINE <=1 SENSITIVE Sensitive     VANCOMYCIN <=0.5 SENSITIVE Sensitive     TRIMETH/SULFA <=10 SENSITIVE Sensitive     CLINDAMYCIN RESISTANT Resistant     RIFAMPIN <=0.5 SENSITIVE Sensitive     Inducible Clindamycin POSITIVE Resistant     * STAPHYLOCOCCUS AUREUS  Blood Culture (routine x 2)     Status: None   Collection Time: 04/27/20 12:55 AM   Specimen: BLOOD  Result Value Ref Range Status   Specimen Description BLOOD RIGTH HAND  Final   Special Requests   Final    BOTTLES DRAWN AEROBIC AND ANAEROBIC Blood Culture adequate volume   Culture   Final    NO GROWTH 5 DAYS Performed at [facility], [address] [place], [place] [address]    Report Status  05/02/2020 FINAL  Final  Respiratory Panel by RT PCR (Flu A&B, Covid) - Nasopharyngeal Swab     Status: None   Collection Time: 04/27/20 12:55 AM   Specimen: Nasopharyngeal Swab  Result Value Ref Range Status   SARS Coronavirus 2 by RT PCR NEGATIVE NEGATIVE Final    Comment: (NOTE) SARS-CoV-2 target nucleic acids are NOT DETECTED.  The SARS-CoV-2 RNA is generally detectable in upper respiratoy specimens during the acute phase of infection. The lowest concentration of SARS-CoV-2 viral copies this assay can detect is 131 copies/mL. A negative result does not preclude SARS-Cov-2 infection and should not be used as the sole basis for treatment or other patient management decisions. A negative result may occur with  improper specimen collection/handling, submission of specimen other than nasopharyngeal swab, presence of viral mutation(s) within the areas targeted by this assay, and inadequate number of viral copies (<131 copies/mL). A negative result must be combined with clinical observations, patient history, and epidemiological information. The expected result is Negative.  Fact Sheet for Patients:  PinkCheek.be  Fact Sheet for Healthcare Providers:  GravelBags.it  This test is no t yet approved or cleared by the [place] FDA and  has been authorized for detection and/or diagnosis of SARS-CoV-2 by FDA under an Emergency Use Authorization (EUA). This EUA will remain  in effect (meaning this test can be used) for the duration of the COVID-19 declaration under Section 564(b)(1) of the Act, 21 U.S.C. section 360bbb-3(b)(1), unless the authorization is terminated or revoked sooner.     Influenza A by PCR NEGATIVE NEGATIVE Final   Influenza B by PCR NEGATIVE NEGATIVE Final    Comment: (NOTE) The Xpert Xpress SARS-CoV-2/FLU/RSV assay is intended as an aid in  the diagnosis of influenza from Nasopharyngeal swab specimens and   should not be used as a sole basis for treatment. Nasal washings and  aspirates are unacceptable for Xpert Xpress SARS-CoV-2/FLU/RSV  testing.  Fact Sheet for Patients: PinkCheek.be  Fact Sheet for Healthcare Providers: GravelBags.it  This test is not yet approved or cleared by the [place] FDA and  has been authorized for detection and/or diagnosis of SARS-CoV-2 by  FDA under an Emergency Use Authorization (EUA). This EUA will remain  in effect (meaning this test can be used) for the duration of the  Covid-19 declaration under Section 564(b)(1) of the Act, 21  U.S.C. section 360bbb-3(b)(1), unless the authorization is  terminated or revoked. Performed at [facility], [address] [place], [place] [address]   Blood Culture ID Panel (Reflexed)     Status: Abnormal   Collection Time: 04/27/20 12:55 AM  Result Value Ref Range Status   Enterococcus faecalis NOT DETECTED NOT DETECTED Final   Enterococcus Faecium NOT DETECTED NOT DETECTED Final   Listeria monocytogenes NOT DETECTED NOT DETECTED Final   Staphylococcus species DETECTED (A) NOT DETECTED Final    Comment: CRITICAL RESULT CALLED TO, READ BACK BY AND VERIFIED WITH: SUSAN WATSON AT 1424 ON 04/27/2020 [place].    Staphylococcus aureus (BCID) DETECTED (A) NOT DETECTED Final    Comment: CRITICAL RESULT CALLED TO, READ BACK BY AND VERIFIED WITH: SUSAN WATSON AT 1424 ON 04/27/2020 [place].    Staphylococcus  epidermidis DETECTED (A) NOT DETECTED Final    Comment: CRITICAL RESULT CALLED TO, READ BACK BY AND VERIFIED WITH: SUSAN WATSON AT 1424 ON 04/27/2020 [place].    Staphylococcus lugdunensis NOT DETECTED NOT DETECTED Final   Streptococcus species NOT DETECTED NOT DETECTED Final   Streptococcus agalactiae NOT DETECTED NOT DETECTED Final   Streptococcus pneumoniae NOT DETECTED NOT DETECTED Final   Streptococcus pyogenes NOT DETECTED NOT DETECTED Final    A.calcoaceticus-baumannii NOT DETECTED NOT DETECTED Final   Bacteroides fragilis NOT DETECTED NOT DETECTED Final   Enterobacterales NOT DETECTED NOT DETECTED Final   Enterobacter cloacae complex NOT DETECTED NOT DETECTED Final   Escherichia coli NOT DETECTED NOT DETECTED Final   Klebsiella aerogenes NOT DETECTED NOT DETECTED Final   Klebsiella oxytoca NOT DETECTED NOT DETECTED Final   Klebsiella pneumoniae NOT DETECTED NOT DETECTED Final   Proteus species NOT DETECTED NOT DETECTED Final   Salmonella species NOT DETECTED NOT DETECTED Final   Serratia marcescens NOT DETECTED NOT DETECTED Final   Haemophilus influenzae NOT DETECTED NOT DETECTED Final   Neisseria meningitidis NOT DETECTED NOT DETECTED Final   Pseudomonas aeruginosa NOT DETECTED NOT DETECTED Final   Stenotrophomonas maltophilia NOT DETECTED NOT DETECTED Final   Candida albicans NOT DETECTED NOT DETECTED Final   Candida auris NOT DETECTED NOT DETECTED Final   Candida glabrata NOT DETECTED NOT DETECTED Final   Candida krusei NOT DETECTED NOT DETECTED Final   Candida parapsilosis NOT DETECTED NOT DETECTED Final   Candida tropicalis NOT DETECTED NOT DETECTED Final   Cryptococcus neoformans/gattii NOT DETECTED NOT DETECTED Final   Methicillin resistance mecA/C DETECTED (A) NOT DETECTED Final    Comment: CRITICAL RESULT CALLED TO, READ BACK BY AND VERIFIED WITH: SUSAN WATSON AT 1424 ON 04/27/2020 [place].    Meth resistant mecA/C and MREJ NOT DETECTED NOT DETECTED Final    Comment: Performed at [facility], [address] [place], [place] [address]  Urine culture     Status: Abnormal   Collection Time: 04/27/20  1:57 AM   Specimen: In/Out Cath Urine  Result Value Ref Range Status   Specimen Description   Final    IN/OUT CATH URINE Performed at [facility], [address], [place], [place] [address]    Special Requests   Final    NONE Performed at [facility], [address] [place],  [place] [address]    Culture MULTIPLE SPECIES PRESENT, SUGGEST RECOLLECTION (A)  Final   Report Status 04/27/2020 FINAL  Final  CULTURE, BLOOD (ROUTINE X 2) w Reflex to ID Panel     Status: None   Collection Time: 04/29/20  7:05 PM   Specimen: BLOOD  Result Value Ref Range Status   Specimen Description BLOOD BLOOD RIGHT HAND  Final   Special Requests   Final    BOTTLES DRAWN AEROBIC AND ANAEROBIC Blood Culture adequate volume   Culture   Final    NO GROWTH 5 DAYS Performed at [facility], [address] [place], [place] [address]    Report Status 05/04/2020 FINAL  Final  CULTURE, BLOOD (ROUTINE X 2) w Reflex to ID Panel     Status: None   Collection Time: 04/29/20  7:06 PM   Specimen: BLOOD  Result Value Ref Range Status   Specimen Description BLOOD RIGHT ANTECUBITAL  Final   Special Requests   Final    BOTTLES DRAWN AEROBIC AND ANAEROBIC Blood Culture results may not be optimal due to an excessive volume of  blood received in culture bottles   Culture   Final    NO GROWTH 5 DAYS Performed at [facility], [address] [place], [place] [address]    Report Status 05/04/2020 FINAL  Final         Radiology Studies: MR BRAIN WO CONTRAST  Result Date: 05/05/2020 CLINICAL DATA:  Stroke follow-up. EXAM: MRI HEAD WITHOUT CONTRAST TECHNIQUE: Multiplanar, multiecho pulse sequences of the brain and surrounding structures were obtained without intravenous contrast. COMPARISON:  Head CT May 04, 2020. FINDINGS: Brain: No acute infarction, hemorrhage, hydrocephalus, extra-axial collection or mass lesion. Remote infarct involving the left lenticulocapsular region, left thalamus and deep white matter of the left frontal lobe with associated retraction of left lateral ventricle. Remote infarct is also seen in the posterior limb of the right internal capsule. Scattered foci of T2 hyperintensity are seen within the white matter of the cerebral hemispheres, nonspecific, most  likely related to chronic small vessel ischemia. Moderate diffuse parenchymal volume loss, more pronounced at the cerebral peduncles and pons, may be related to wallerian degeneration. Vascular: Normal flow voids. Skull and upper cervical spine: Normal marrow signal. Sinuses/Orbits: Bilateral lens surgery mucosal thickening of the bilateral ethmoid cells. Other: None. IMPRESSION: 1. No acute intracranial abnormality. 2. Remote infarct involving the left lenticulo capsular region, left thalamus and deep white matter of the left frontal lobe and in the posterior limb of the right internal capsule. 3. 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 4. Mild chronic small vessel ischemia. Electronically Signed   By: Pedro Earls M.D.   On: 05/05/2020 12:34        Scheduled Meds: . amLODipine  10 mg Oral Daily  . aspirin  81 mg Oral Daily  . atorvastatin  20 mg Oral q1800  . Chlorhexidine Gluconate Cloth  6 each Topical Daily  . clopidogrel  75 mg Oral Daily  . enoxaparin (LOVENOX) injection  40 mg Subcutaneous Q24H  . feeding supplement (NEPRO CARB STEADY)  237 mL Oral BID BM  . hydrALAZINE  100 mg Oral BID  . insulin aspart  0-9 Units Subcutaneous TID WC  . insulin aspart  5 Units Subcutaneous TID WC  . insulin glargine  22 Units Subcutaneous QHS  . levETIRAcetam  500 mg Oral BID  . metoprolol succinate  100 mg Oral Daily  . multivitamin with minerals  1 tablet Oral Daily  . sodium chloride flush  10-40 mL Intracatheter Q12H   Continuous Infusions: . DAPTOmycin (CUBICIN)  IV 500 mg (05/05/20 2021)     LOS: 9 days    Time spent: 28 minutes    Sharen Hones, MD [facility]   To contact the attending provider between 7A-7P or the covering provider during after hours 7P-7A, please log into the web site [email or web address] and access using universal  password for that web  site. If you do not have the password, please call the hospital operator.  05/06/2020, 2:45 PM

## 2020-05-06 NOTE — Care Management Important Message (Signed)
Important Message  Patient Details  Name: Susan House MRN: [id number] Date of Birth: 01/23/1956   Medicare Important Message Given:  Yes     Dannette Barbara 05/06/2020, 1:40 PM

## 2020-05-06 NOTE — Progress Notes (Signed)
ID Pt is more alert,  But continues to have  expressive and receptive dysphasia Fluctuating mental status and verbal capacity  Patient Vitals for the past 24 hrs:  BP Temp Temp src Pulse Resp SpO2 Weight  05/06/20 1926 (!) 185/66 98.4 F (36.9 C) Oral 99 16 98 % --  05/06/20 1611 (!) 165/68 98.8 F (37.1 C) Oral (!) 109 18 97 % --  05/06/20 1110 (!) 176/71 98.9 F (37.2 C) Oral 91 18 98 % --  05/06/20 1000 (!) 175/64 99.1 F (37.3 C) Oral (!) 107 20 99 % --  05/06/20 0809 (!) 174/85 99.1 F (37.3 C) Oral (!) 113 18 98 % --  05/06/20 0421 133/60 99 F (37.2 C) Oral 92 19 99 % 98.8 kg  05/05/20 2109 (!) 150/47 98.9 F (37.2 C) Oral 93 -- 99 % --  awake Chest b/l air entry Hs1s2 abd soft Cns left hemiparesis  Labs CBC Latest Ref Rng & Units 05/06/2020 05/05/2020 05/04/2020  WBC 4.0 - 10.5 K/uL 11.2(H) 9.1 9.9  Hemoglobin 12.0 - 15.0 g/dL 8.3(L) 7.8(L) 8.4(L)  Hematocrit 36 - 46 % 25.2(L) 23.5(L) 25.4(L)  Platelets 150 - 400 K/uL 310 283 302    CMP Latest Ref Rng & Units 05/06/2020 05/04/2020 05/03/2020  Glucose 70 - 99 mg/dL 264(H) 193(H) 289(H)  BUN 8 - 23 mg/dL 32(H) 41(H) 38(H)  Creatinine 0.44 - 1.00 mg/dL 1.73(H) 1.96(H) 2.14(H)  Sodium 135 - 145 mmol/L 135 133(L) 132(L)  Potassium 3.5 - 5.1 mmol/L 4.1 4.0 4.0  Chloride 98 - 111 mmol/L 103 101 99  CO2 22 - 32 mmol/L 20(L) 23 25  Calcium 8.9 - 10.3 mg/dL 7.9(L) 8.2(L) 7.9(L)  Total Protein 6.5 - 8.1 g/dL - 6.0(L) -  Total Bilirubin 0.3 - 1.2 mg/dL - 0.7 -  Alkaline Phos 38 - 126 U/L - 50 -  AST 15 - 41 U/L - 24 -  ALT 0 - 44 U/L - 17 -    Micro 04/27/20 [facility]- 1 set has MSSA, enterococcus fecalis, staph hominis, staph epidermidis, staph simulans   10/14 BC- NG  Impression/recommendation  Fluctuating mental statis and fluctuating aphasia  Goes from alert and fluent to having expressive and receptive dysphasia  Underlying CVA- with left hemiparesis- seen by neuro MRI done today shows No acute intracranial  abnormality.2. Remote infarct involving the left lenticulo capsular region, left thalamus and deep white matter of the left frontal lobe and in the posterior limb of the right internal capsule.3. 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 4. Mild chronic small vessel ischemia   Polymicrobial bacteremia-= 2 pathogens and 3 contaminants On daptomycin for MSSA and enterococcus fecalis bacteremia awaiting TEE ( once more alert) to r/o vegetation /PFO/thrombus especially with a calcified mitral valve  DKA on admission resolved   AKI on CKD- improving- off vanco  H/o falls  Discussed the management with care team Dr.Fitzgerald is on call tomorrow- please call if needed

## 2020-05-06 NOTE — H&P (View-Only) (Signed)
Progress Note  Patient Name: Susan House Date of Encounter: 05/06/2020  Primary Cardiologist: Rockey Situ  Subjective   More lucid today.  Family at bedside and notes significant improvement in the patient over the past 24 to 48 hours.  No chest pain or dyspnea.  Inpatient Medications    Scheduled Meds: . amLODipine  10 mg Oral Daily  . aspirin  81 mg Oral Daily  . atorvastatin  20 mg Oral q1800  . Chlorhexidine Gluconate Cloth  6 each Topical Daily  . clopidogrel  75 mg Oral Daily  . enoxaparin (LOVENOX) injection  40 mg Subcutaneous Q24H  . feeding supplement (NEPRO CARB STEADY)  237 mL Oral BID BM  . hydrALAZINE  100 mg Oral BID  . insulin aspart  0-9 Units Subcutaneous TID WC  . insulin aspart  5 Units Subcutaneous TID WC  . insulin glargine  22 Units Subcutaneous QHS  . levETIRAcetam  500 mg Oral BID  . metoprolol succinate  100 mg Oral Daily  . multivitamin with minerals  1 tablet Oral Daily  . sodium chloride flush  10-40 mL Intracatheter Q12H   Continuous Infusions: . DAPTOmycin (CUBICIN)  IV 500 mg (05/05/20 2021)   PRN Meds: acetaminophen, alum & mag hydroxide-simeth, dextrose, docusate sodium, hydrALAZINE, labetalol, nitroGLYCERIN, ondansetron (ZOFRAN) IV, sodium chloride flush   Vital Signs    Vitals:   05/06/20 0421 05/06/20 0809 05/06/20 1000 05/06/20 1110  BP: 133/60 (!) 174/85 (!) 175/64 (!) 176/71  Pulse: 92 (!) 113 (!) 107 91  Resp: 19 18 20 18   Temp: 99 F (37.2 C) 99.1 F (37.3 C) 99.1 F (37.3 C) 98.9 F (37.2 C)  TempSrc: Oral Oral Oral Oral  SpO2: 99% 98% 99% 98%  Weight: 98.8 kg     Height:        Intake/Output Summary (Last 24 hours) at 05/06/2020 1157 Last data filed at 05/06/2020 1100 Gross per 24 hour  Intake 240 ml  Output 1075 ml  Net -835 ml   Filed Weights   05/02/20 0418 05/05/20 0421 05/06/20 0421  Weight: 92.2 kg 98.7 kg 98.8 kg    Telemetry    SR, 100s bpm - Personally Reviewed  ECG    No new  tracings - Personally Reviewed  Physical Exam   GEN: No acute distress.   Neck: No JVD. Cardiac: RRR, no murmurs, rubs, or gallops.  Respiratory: Clear to auscultation bilaterally.  GI: Soft, nontender, non-distended.   MS: No edema; No deformity. Neuro:  Alert and oriented x 3.  Expressive aphasia. Psych: Normal affect.  Labs    Chemistry Recent Labs  Lab 05/03/20 0318 05/04/20 0529 05/04/20 1349 05/06/20 0430  NA 132* 133*  --  135  K 4.0 4.0  --  4.1  CL 99 101  --  103  CO2 25 23  --  20*  GLUCOSE 289* 193*  --  264*  BUN 38* 41*  --  32*  CREATININE 2.14* 1.96*  --  1.73*  CALCIUM 7.9* 8.2*  --  7.9*  PROT  --   --  6.0*  --   ALBUMIN  --   --  2.8*  --   AST  --   --  24  --   ALT  --   --  17  --   ALKPHOS  --   --  50  --   BILITOT  --   --  0.7  --   [facility]  24* 26*  --  31*  ANIONGAP 8 9  --  12     Hematology Recent Labs  Lab 05/04/20 1349 05/05/20 0444 05/06/20 0430  WBC 9.9 9.1 11.2*  RBC 3.02* 2.77* 2.94*  HGB 8.4* 7.8* 8.3*  HCT 25.4* 23.5* 25.2*  MCV 84.1 84.8 85.7  MCH 27.8 28.2 28.2  MCHC 33.1 33.2 32.9  RDW 15.5 15.8* 15.5  PLT 302 283 310    Cardiac EnzymesNo results for input(s): TROPONINI in the last 168 hours. No results for input(s): TROPIPOC in the last 168 hours.   BNPNo results for input(s): BNP, PROBNP in the last 168 hours.   DDimer No results for input(s): DDIMER in the last 168 hours.   Radiology    EEG  Result Date: 05/04/2020 IMPRESSION: This study is suggestive of moderate diffuse encephalopathy of nonspecific etiology.  Patient was noted to have right arm tremor like movement without concomitant EEG changes and was likely not epileptic.  No seizures or epileptiform discharges were seen throughout the recording. Lora Havens   MR BRAIN WO CONTRAST  Result Date: 05/05/2020 IMPRESSION: 1. No acute intracranial abnormality. 2. Remote infarct involving the left lenticulo capsular region, left thalamus and deep  white matter of the left frontal lobe and in the posterior limb of the right internal capsule. 3. 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 4. Mild chronic small vessel ischemia. Electronically Signed   By: Pedro Earls M.D.   On: 05/05/2020 12:34    Cardiac Studies   2D echo 04/28/2020: 1. Left ventricular ejection fraction, by estimation, is 65 to 70%. The  left ventricle has normal function. The left ventricle has no regional  wall motion abnormalities. Left ventricular diastolic parameters are  consistent with Grade III diastolic  dysfunction (restrictive).  2. Right ventricular systolic function is normal. The right ventricular  size is normal.  3. The mitral valve is normal in structure. Trivial mitral valve  regurgitation.  4. The aortic valve is normal in structure. Aortic valve regurgitation is  not visualized.   Patient Profile     64 y.o. female with history of CVA/TIA, HTN, and HLD who we are seeing for evaluation of TEE.  Assessment & Plan    1.  MSSA bacteremia/sepsis: -Initially, TEE has been deferred secondary to underlying encephalopathy -Patient is significantly improved today and much more lucid, family member at bedside agrees -MRI on 10/20 without acute intracranial abnormality with remote infarct noted -After rounding with MD we will plan for the patient to be n.p.o. overnight with potential TEE on 10/22 - The risks and benefits of transesophageal echocardiogram have been explained including risks of esophageal damage, perforation (1:10,000 risk), bleeding, pharyngeal hematoma as well as other potential complications associated with conscious sedation including aspiration, arrhythmia, respiratory failure and death. Alternatives to treatment were discussed, questions were answered. Patient is willing to proceed   For questions or updates, please  contact [place] Please consult [email or web address] for contact info under Cardiology/STEMI.    Signed, Christell Faith, PA-C [place] Pager: [phone] 05/06/2020, 11:57 AM

## 2020-05-07 ENCOUNTER — Inpatient Hospital Stay (HOSPITAL_COMMUNITY)
Admit: 2020-05-07 | Discharge: 2020-05-07 | Disposition: A | Discharge status: Home / Self Care | Payer: Medicare Other | Attending: Physician Assistant | Admitting: Physician Assistant

## 2020-05-07 ENCOUNTER — Encounter: Payer: Self-pay | Admitting: Urgent Care

## 2020-05-07 ENCOUNTER — Encounter
Admission: EM | Disposition: A | Discharge status: Skilled Nursing Facility | Payer: Self-pay | Source: Home / Self Care | Attending: Obstetrics and Gynecology

## 2020-05-07 DIAGNOSIS — N171 Acute kidney failure with acute cortical necrosis: Secondary | ICD-10-CM | POA: Diagnosis not present

## 2020-05-07 DIAGNOSIS — A4101 Sepsis due to Methicillin susceptible Staphylococcus aureus: Secondary | ICD-10-CM | POA: Diagnosis not present

## 2020-05-07 DIAGNOSIS — I361 Nonrheumatic tricuspid (valve) insufficiency: Secondary | ICD-10-CM

## 2020-05-07 DIAGNOSIS — R7881 Bacteremia: Secondary | ICD-10-CM

## 2020-05-07 DIAGNOSIS — I340 Nonrheumatic mitral (valve) insufficiency: Secondary | ICD-10-CM | POA: Diagnosis not present

## 2020-05-07 DIAGNOSIS — L89159 Pressure ulcer of sacral region, unspecified stage: Secondary | ICD-10-CM

## 2020-05-07 DIAGNOSIS — E1110 Type 2 diabetes mellitus with ketoacidosis without coma: Secondary | ICD-10-CM | POA: Diagnosis not present

## 2020-05-07 DIAGNOSIS — R4701 Aphasia: Secondary | ICD-10-CM | POA: Diagnosis not present

## 2020-05-07 HISTORY — PX: TEE WITHOUT CARDIOVERSION: SHX5443

## 2020-05-07 LAB — BASIC METABOLIC PANEL
Anion gap: 11 (ref 5–15)
BUN: 38 mg/dL — ABNORMAL HIGH (ref 8–23)
CO2: 22 mmol/L (ref 22–32)
Calcium: 8.2 mg/dL — ABNORMAL LOW (ref 8.9–10.3)
Chloride: 103 mmol/L (ref 98–111)
Creatinine, Ser: 1.76 mg/dL — ABNORMAL HIGH (ref 0.44–1.00)
GFR, Estimated: 32 mL/min — ABNORMAL LOW (ref >60)
Glucose, Bld: 168 mg/dL — ABNORMAL HIGH (ref 70–99)
Potassium: 3.5 mmol/L (ref 3.5–5.1)
Sodium: 136 mmol/L (ref 135–145)

## 2020-05-07 LAB — CBC
HCT: 24.1 % — ABNORMAL LOW (ref 36.0–46.0)
Hemoglobin: 8.1 g/dL — ABNORMAL LOW (ref 12.0–15.0)
MCH: 28 pg (ref 26.0–34.0)
MCHC: 33.6 g/dL (ref 30.0–36.0)
MCV: 83.4 fL (ref 80.0–100.0)
Platelets: 340 10*3/uL (ref 150–400)
RBC: 2.89 MIL/uL — ABNORMAL LOW (ref 3.87–5.11)
RDW: 15.3 % (ref 11.5–15.5)
WBC: 11.4 10*3/uL — ABNORMAL HIGH (ref 4.0–10.5)
nRBC: 0 % (ref 0.0–0.2)

## 2020-05-07 LAB — GLUCOSE, CAPILLARY
Glucose-Capillary: 169 mg/dL — ABNORMAL HIGH (ref 70–99)
Glucose-Capillary: 140 mg/dL — ABNORMAL HIGH (ref 70–99)
Glucose-Capillary: 253 mg/dL — ABNORMAL HIGH (ref 70–99)
Glucose-Capillary: 168 mg/dL — ABNORMAL HIGH (ref 70–99)

## 2020-05-07 LAB — MAGNESIUM: Magnesium: 1.9 mg/dL (ref 1.7–2.4)

## 2020-05-07 SURGERY — ECHOCARDIOGRAM, TRANSESOPHAGEAL
Anesthesia: Moderate Sedation

## 2020-05-07 MED ORDER — FENTANYL CITRATE (PF) 100 MCG/2ML IJ SOLN
INTRAMUSCULAR | Status: AC
  Administered 2020-05-07: 25 ug
  Filled 2020-05-07: qty 2

## 2020-05-07 MED ORDER — MIDAZOLAM HCL 5 MG/5ML IJ SOLN
INTRAMUSCULAR | Status: AC
  Administered 2020-05-07: 1 mg
  Filled 2020-05-07: qty 5

## 2020-05-07 MED ORDER — MIDAZOLAM HCL 2 MG/2ML IJ SOLN
INTRAMUSCULAR | Status: AC | PRN
  Administered 2020-05-07: 1 mg via INTRAVENOUS

## 2020-05-07 MED ORDER — INSULIN ASPART 100 UNIT/ML ~~LOC~~ SOLN
8.0000 [IU] | Freq: Three times a day (TID) | SUBCUTANEOUS | Status: DC
  Administered 2020-05-07 – 2020-05-09 (×5): 8 [IU] via SUBCUTANEOUS
  Filled 2020-05-07 (×6): qty 1

## 2020-05-07 MED ORDER — LIDOCAINE VISCOUS HCL 2 % MT SOLN
OROMUCOSAL | Status: AC
  Administered 2020-05-07: 15 mL
  Filled 2020-05-07: qty 15

## 2020-05-07 MED ORDER — LINEZOLID 600 MG PO TABS
600.0000 mg | ORAL_TABLET | Freq: Two times a day (BID) | ORAL | Status: DC
  Administered 2020-05-07 – 2020-05-09 (×4): 600 mg via ORAL
  Filled 2020-05-07 (×5): qty 1

## 2020-05-07 MED ORDER — LORAZEPAM 2 MG/ML IJ SOLN
INTRAMUSCULAR | Status: AC | PRN
  Administered 2020-05-07: 1 mg via INTRAVENOUS

## 2020-05-07 MED ORDER — LINEZOLID 600 MG PO TABS: 600.0000 mg | ORAL_TABLET | Freq: Two times a day (BID) | ORAL | 0 refills | Status: DC

## 2020-05-07 MED ORDER — FENTANYL CITRATE (PF) 100 MCG/2ML IJ SOLN
INTRAMUSCULAR | Status: AC | PRN
  Administered 2020-05-07: 25 ug via INTRAVENOUS

## 2020-05-07 NOTE — Progress Notes (Signed)
Progress Note  Patient Name: Susan House Date of Encounter: 05/07/2020  Primary Cardiologist: Rockey Situ  Subjective   Awake and alert this morning. No chest pain or dyspnea. No dysphagia or prior esophageal injury. BP mildly elevated this morning. She is for TEE today.   Inpatient Medications    Scheduled Meds: . amLODipine  10 mg Oral Daily  . aspirin  81 mg Oral Daily  . atorvastatin  20 mg Oral q1800  . Chlorhexidine Gluconate Cloth  6 each Topical Daily  . clopidogrel  75 mg Oral Daily  . enalapril  20 mg Oral BID  . enoxaparin (LOVENOX) injection  40 mg Subcutaneous Q24H  . feeding supplement (NEPRO CARB STEADY)  237 mL Oral BID BM  . hydrALAZINE  100 mg Oral BID  . insulin aspart  0-9 Units Subcutaneous TID WC  . insulin aspart  8 Units Subcutaneous TID WC  . insulin glargine  22 Units Subcutaneous QHS  . levETIRAcetam  500 mg Oral BID  . metoprolol succinate  100 mg Oral Daily  . multivitamin with minerals  1 tablet Oral Daily  . sodium chloride flush  10-40 mL Intracatheter Q12H   Continuous Infusions: . sodium chloride 20 mL/hr at 05/07/20 0557  . DAPTOmycin (CUBICIN)  IV Stopped (05/06/20 2305)   PRN Meds: acetaminophen, alum & mag hydroxide-simeth, dextrose, docusate sodium, hydrALAZINE, labetalol, nitroGLYCERIN, ondansetron (ZOFRAN) IV, sodium chloride flush   Vital Signs    Vitals:   05/06/20 1611 05/06/20 1926 05/07/20 0054 05/07/20 0312  BP: (!) 165/68 (!) 185/66  (!) 176/79  Pulse: (!) 109 99  94  Resp: 18 16  18   Temp: 98.8 F (37.1 C) 98.4 F (36.9 C)  98.2 F (36.8 C)  TempSrc: Oral Oral  Oral  SpO2: 97% 98%  98%  Weight:   101 kg 98.9 kg  Height:        Intake/Output Summary (Last 24 hours) at 05/07/2020 0742 Last data filed at 05/07/2020 0557 Gross per 24 hour  Intake 1163.42 ml  Output 1200 ml  Net -36.58 ml   Filed Weights   05/06/20 0421 05/07/20 0054 05/07/20 0312  Weight: 98.8 kg 101 kg 98.9 kg    Telemetry     SR, 90 to low 100s bpm, rare PVCs - Personally Reviewed  ECG    No new tracings - Personally Reviewed  Physical Exam   GEN: No acute distress.   Neck: No JVD. Cardiac: RRR, no murmurs, rubs, or gallops.  Respiratory: Clear to auscultation bilaterally.  GI: Soft, nontender, non-distended.   MS: No edema; No deformity. Neuro:  Alert and oriented x 3.  Expressive aphasia. Psych: Normal affect.  Labs    Chemistry Recent Labs  Lab 05/04/20 0529 05/04/20 1349 05/06/20 0430 05/07/20 0535  NA 133*  --  135 136  K 4.0  --  4.1 3.5  CL 101  --  103 103  CO2 23  --  20* 22  GLUCOSE 193*  --  264* 168*  BUN 41*  --  32* 38*  CREATININE 1.96*  --  1.73* 1.76*  CALCIUM 8.2*  --  7.9* 8.2*  PROT  --  6.0*  --   --   ALBUMIN  --  2.8*  --   --   AST  --  24  --   --   ALT  --  17  --   --   ALKPHOS  --  50  --   --  BILITOT  --  0.7  --   --   GFRNONAA 26*  --  31* 32*  ANIONGAP 9  --  12 11     Hematology Recent Labs  Lab 05/05/20 0444 05/06/20 0430 05/07/20 0535  WBC 9.1 11.2* 11.4*  RBC 2.77* 2.94* 2.89*  HGB 7.8* 8.3* 8.1*  HCT 23.5* 25.2* 24.1*  MCV 84.8 85.7 83.4  MCH 28.2 28.2 28.0  MCHC 33.2 32.9 33.6  RDW 15.8* 15.5 15.3  PLT 283 310 340    Cardiac EnzymesNo results for input(s): TROPONINI in the last 168 hours. No results for input(s): TROPIPOC in the last 168 hours.   BNPNo results for input(s): BNP, PROBNP in the last 168 hours.   DDimer No results for input(s): DDIMER in the last 168 hours.   Radiology    EEG  Result Date: 05/04/2020 IMPRESSION: This study is suggestive of moderate diffuse encephalopathy of nonspecific etiology.  Patient was noted to have right arm tremor like movement without concomitant EEG changes and was likely not epileptic.  No seizures or epileptiform discharges were seen throughout the recording. Lora Havens   MR BRAIN WO CONTRAST  Result Date: 05/05/2020 IMPRESSION: 1. No acute intracranial abnormality. 2.  Remote infarct involving the left lenticulo capsular region, left thalamus and deep white matter of the left frontal lobe and in the posterior limb of the right internal capsule. 3. 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 4. Mild chronic small vessel ischemia. Electronically Signed   By: Pedro Earls M.D.   On: 05/05/2020 12:34    Cardiac Studies   2D echo 04/28/2020: 1. Left ventricular ejection fraction, by estimation, is 65 to 70%. The  left ventricle has normal function. The left ventricle has no regional  wall motion abnormalities. Left ventricular diastolic parameters are  consistent with Grade III diastolic  dysfunction (restrictive).  2. Right ventricular systolic function is normal. The right ventricular  size is normal.  3. The mitral valve is normal in structure. Trivial mitral valve  regurgitation.  4. The aortic valve is normal in structure. Aortic valve regurgitation is  not visualized.   Patient Profile     64 y.o. female with history of CVA/TIA, HTN, and HLD who we are seeing for evaluation of TEE.  Assessment & Plan    1.  MSSA bacteremia/sepsis: -Initially, TEE had been deferred secondary to underlying encephalopathy -Patient is significantly improved at this time and alert/oriented   -MRI on 10/20 without acute intracranial abnormality with remote infarct noted -NPO -She is for TEE today - The risks and benefits of transesophageal echocardiogram have been explained including risks of esophageal damage, perforation (1:10,000 risk), bleeding, pharyngeal hematoma as well as other potential complications associated with conscious sedation including aspiration, arrhythmia, respiratory failure and death. Alternatives to treatment were discussed, questions were answered. Patient is willing to proceed   For questions or updates, please contact [place] Please consult [email or web address] for contact info under Cardiology/STEMI.    Signed, Christell Faith, PA-C [place] Pager: [phone] 05/07/2020, 7:42 AM

## 2020-05-07 NOTE — TOC Progression Note (Signed)
Transition of Care [facility]) - Progression Note    Patient Details  Name: Susan House MRN: [id number] Date of Birth: Nov 16, 1955  [facility] CM/SW Contact  Eileen Stanford, LCSW Phone Number: 05/07/2020, 3:06 PM  Clinical Narrative:   CSW had to call and cancel transport. CSW received a call from SNF at 3:07 that insurance has not been approved. It is determine that [employer] is no longer doing the waiver. Updated clinicals were sent earlier today. Awaiting new approval. If not today facility is prepared to take pt this weekend if approval granted.      Barriers to Discharge: No Barriers Identified  Expected Discharge Plan and Services           Expected Discharge Date: 05/07/20                                     Social Determinants of Health (SDOH) Interventions    Readmission Risk Interventions No flowsheet data found.

## 2020-05-07 NOTE — Progress Notes (Signed)
ID Pt is more alert,  Tee negative  Patient Vitals for the past 24 hrs:  BP Temp Temp src Pulse Resp SpO2 Height Weight  05/07/20 1059 140/63 98.7 F (37.1 C) Oral 98 18 99 % -- --  05/07/20 1015 (!) 169/81 -- -- 92 15 99 % -- --  05/07/20 1000 (!) 161/73 -- -- 96 15 99 % -- --  05/07/20 0859 (!) 157/71 -- -- -- -- -- -- --  05/07/20 0855 -- 98.4 F (36.9 C) Oral (!) 102 17 98 % 5\' 3"  (1.6 m) 98.9 kg  05/07/20 0808 (!) 183/62 98.8 F (37.1 C) Oral (!) 109 16 100 % -- --  05/07/20 0312 (!) 176/79 98.2 F (36.8 C) Oral 94 18 98 % -- 98.9 kg  05/07/20 0054 -- -- -- -- -- -- -- 101 kg  05/06/20 1926 (!) 185/66 98.4 F (36.9 C) Oral 99 16 98 % -- --  05/06/20 1611 (!) 165/68 98.8 F (37.1 C) Oral (!) 109 18 97 % -- --  awake Chest b/l air entry Hs1s2 abd soft Cns left hemiparesis  Labs CBC Latest Ref Rng & Units 05/07/2020 05/06/2020 05/05/2020  WBC 4.0 - 10.5 K/uL 11.4(H) 11.2(H) 9.1  Hemoglobin 12.0 - 15.0 g/dL 8.1(L) 8.3(L) 7.8(L)  Hematocrit 36 - 46 % 24.1(L) 25.2(L) 23.5(L)  Platelets 150 - 400 K/uL 340 310 283    CMP Latest Ref Rng & Units 05/07/2020 05/06/2020 05/04/2020  Glucose 70 - 99 mg/dL 168(H) 264(H) 193(H)  BUN 8 - 23 mg/dL 38(H) 32(H) 41(H)  Creatinine 0.44 - 1.00 mg/dL 1.76(H) 1.73(H) 1.96(H)  Sodium 135 - 145 mmol/L 136 135 133(L)  Potassium 3.5 - 5.1 mmol/L 3.5 4.1 4.0  Chloride 98 - 111 mmol/L 103 103 101  CO2 22 - 32 mmol/L 22 20(L) 23  Calcium 8.9 - 10.3 mg/dL 8.2(L) 7.9(L) 8.2(L)  Total Protein 6.5 - 8.1 g/dL - - 6.0(L)  Total Bilirubin 0.3 - 1.2 mg/dL - - 0.7  Alkaline Phos 38 - 126 U/L - - 50  AST 15 - 41 U/L - - 24  ALT 0 - 44 U/L - - 17    Micro 04/27/20 BC- 1 set has MSSA, enterococcus fecalis, staph hominis, staph epidermidis, staph simulans   10/14 BC- NG  Impression/recommendation  Fluctuating mental statis and fluctuating aphasia  Goes from alert and fluent to having expressive and receptive dysphasia  Underlying CVA- with left  hemiparesis- seen by neuro MRI shows No acute intracranial abnormality.2. Remote infarct involving the left lenticulo capsular region, left thalamus and deep white matter of the left frontal lobe and in the posterior limb of the right internal capsule.3. 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 4. Mild chronic small vessel ischemia   Polymicrobial bacteremia-= 2 pathogens and 3 contaminants On daptomycin for MSSA and enterococcus fecalis bacteremia Negative TEE  Can cont dapto while inpatient but at dc send on linezolid to finish a total 14 day course for MRSA bacteremia. TODAY IS Day 11 so if dced today only needs 3 more days.   DKA on admission resolved   AKI on CKD- improving- off vanco  H/o falls

## 2020-05-07 NOTE — CV Procedure (Signed)
    Transesophageal Echocardiogram Note  Kambrea Carrasco [id number] Mar 11, 1956  Procedure: Transesophageal Echocardiogram Indications: Bacteremia  Procedure Details Consent: Obtained Time Out: Verified patient identification, verified procedure, site/side was marked, verified correct patient position, special equipment/implants available, Radiology Safety Procedures followed,  medications/allergies/relevent history reviewed, required imaging and test results available.  Performed  Medications:  During this procedure the patient is administered a total of Versed 3 mg and Fentanyl 50 mcg  to achieve and maintain moderate conscious sedation.  The patient's heart rate, blood pressure, and oxygen saturation are monitored continuously during the procedure. The period of conscious sedation is 15 minutes, of which I was present face-to-face 100% of this time.  Left Ventrical:  Thickened with normal contraction.  Mitral Valve: Mildly thickened. Moderately calcified.  Mild MR.  No vegetation.  Aortic Valve: Mildly thickened.  Moderately calcified.  No AI.  No vegetation.  Tricuspid Valve: Normal with trivial TR.  Pulmonic Valve: Normal. No vegetation.  Left Atrium/ Left atrial appendage: No thrombus.  Atrial septum: Redundant and hypermobile.  No right-to-left shunt.  Aorta: Mild plaquing.  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  Complications: No apparent complications Patient did tolerate procedure well.   Nelva Bush, MD 05/07/2020, 10:00 AM

## 2020-05-07 NOTE — TOC Progression Note (Signed)
Transition of Care [facility]) - Progression Note    Patient Details  Name: Susan House MRN: [id number] Date of Birth: May 13, 1956  [facility]) CM/SW [place], LCSW Phone Number: 05/07/2020, 1:24 PM  Clinical Narrative:   Authorization has been restarted through [employer] portal.         Expected Discharge Plan and Services           Expected Discharge Date: 05/07/20                                     Social Determinants of Health (SDOH) Interventions    Readmission Risk Interventions No flowsheet data found.

## 2020-05-07 NOTE — Progress Notes (Signed)
Pt to be NPO till 1145 tand then start sips of water. Pt is awake at this time but drowsy. Will continue to monitor her at this time.

## 2020-05-07 NOTE — Progress Notes (Signed)
*  PRELIMINARY RESULTS* Echocardiogram Echocardiogram Transesophageal has been performed.  Susan House 05/07/2020, 9:52 AM

## 2020-05-07 NOTE — Progress Notes (Signed)
Patient BP was 183/62.  Per Cardiology ok to give IV hydralazine prior to procedure

## 2020-05-07 NOTE — TOC Transition Note (Signed)
Transition of Care [facility]) - CM/SW Discharge Note   Patient Details  Name: Susan House MRN: [id number] Date of Birth: 1955/11/19  [facility] CM/SW Contact:  Eileen Stanford, LCSW Phone Number: 05/07/2020, 1:39 PM   Clinical Narrative:  Clinical Social Worker facilitated patient discharge including contacting patient family and facility to confirm patient discharge plans.  Clinical information faxed to facility and family agreeable with plan.  CSW arranged ambulance transport via [facility] to [facility].  RN to call [phone] for report prior to discharge.   Final next level of care: Skilled Nursing Facility Barriers to Discharge: No Barriers Identified   Patient Goals and CMS Choice        Discharge Placement              Patient chooses bed at: [facility] [place] Patient to be transferred to facility by: [facility] Name of family member notified: thomas Patient and family notified of of transfer: 05/07/20  Discharge Plan and Services                                     Social Determinants of Health (SDOH) Interventions     Readmission Risk Interventions No flowsheet data found.

## 2020-05-07 NOTE — Interval H&P Note (Signed)
History and Physical Interval Note:  05/07/2020 9:20 AM  Susan House  has presented today for surgery, with the diagnosis of bacteremia.  The various methods of treatment have been discussed with the patient and family. After consideration of risks, benefits and other options for treatment, the patient has consented to  Procedure(s): TRANSESOPHAGEAL ECHOCARDIOGRAM (TEE) (N/A) as a surgical intervention.  The patient's history has been reviewed, patient examined, no change in status, stable for surgery.  I have reviewed the patient's chart and labs.  Questions were answered to the patient's satisfaction.     [name]

## 2020-05-07 NOTE — Discharge Summary (Signed)
Physician Discharge Summary  Patient ID: Susan House MRN: [id number] DOB/AGE: Nov 20, 1955 64 y.o.  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 She has been having decreased energy level and vomiting at home. She was tachycardic and tachypneic upon arrival. She was having extensive fecal material around her and several bruises.  Upon presentation to the emergency room blood pressure was167/92 with a heart rate of 126 and respiratory to 26 with pulse symmetry of 98% on room air.Labs revealed ABG with pH 7.37 and HCO3 of 12.7 and BMP revealed a CO2 of 14 with a blood glucose of 448 and a creatinine of 1.9 with anion gap of 23 magnesium of 1.3 and total bili of 2.1 with lactic acid of 2.1 later 2.3 and CBC showed leukocytosis 11.4 with relative neutrophilia. Beta hydroxybutyrate was 7.11. COVID-19 PCR and influenza antigens came back negative. UA showed more than 50 WBCs more than 300 protein and 20 ketones and more than 500 glucose.   Since admission to the hospital, she has positive urine culture showed multiple species, blood culture was positive with 4 different organisms including MSSA,  Staph hominis, static coccus  epidermidis and Enterococcus faecalis. Patient has been seen by ID, presumed MSSA, Enterococcus faecalis are trueinfections;others may be contaminants. Repeated blood culture was negative. Patient covered with daptomycin. Also recommended TEE from ID.  Patient also had DKA, require insulin drip. Condition had improved.  Patient seem to have worsening aphasia since 10/19. Was seen by neurology. MRI of the brain is negative for acute stroke.  10/22.  TEE did not show any vegetation.  Discussed with ID, patient will only need 3 days of Zyvox.  #1.  Severe sepsis with MSSA and Enterococcus septicemia. TEE did not show any vegetation. Discussed with ID, patient will only need 3 days of Zyvox at this point.  Medically stable to be discharged.  2.  Acute encephalopathy and aphasia. Aphasia appears to be secondary to prior stroke.  Reviewed MRI results, no acute stroke.  Patient mental status seem to be improving.  3.  Acute kidney injury on chronic kidney disease stage IIIa. Renal function improved  4.  Hypokalemia. Improved.  5.  Anemia of chronic disease with iron deficient anemia.  B12 level borderline, homocystine level normal. Received IV Iron  6.  Moderate protein and calorie malnutrition. Continue supplement in SNF.   7.  DKA.  Resolved.  Consults: ID  Neurology  Significant Diagnostic Studies:  MRI HEAD WITHOUT CONTRAST  TECHNIQUE: Multiplanar, multiecho pulse sequences of the brain and surrounding structures were obtained without intravenous contrast.  COMPARISON:  Head CT May 04, 2020.  FINDINGS: Brain: No acute infarction, hemorrhage, hydrocephalus, extra-axial collection or mass lesion.  Remote infarct involving the left lenticulocapsular region, left thalamus and deep white matter of the left frontal lobe  with associated retraction of left lateral ventricle. Remote infarct is also seen in the posterior limb of the right internal  capsule.  Scattered foci of T2 hyperintensity are seen within the white matter of the cerebral hemispheres, nonspecific, most likely related to chronic small vessel ischemia.  Moderate diffuse parenchymal volume loss, more pronounced at the cerebral peduncles and pons, may be related to wallerian degeneration.  Vascular: Normal flow voids.  Skull and upper cervical spine: Normal marrow signal.  Sinuses/Orbits: Bilateral lens surgery mucosal thickening of the bilateral ethmoid cells.  Other: None.  IMPRESSION: 1. No acute intracranial abnormality. 2. Remote infarct involving the left lenticulo capsular region, left thalamus and deep white matter of the left frontal lobe and in the posterior limb of the right internal capsule. 3. 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 4. Mild chronic small vessel ischemia.   Electronically Signed   By: Pedro Earls M.D.   On: 05/05/2020 12:34  TEE: 1. Left ventricular ejection fraction, by estimation, is 65 to 70%. The left ventricle has normal function. 2. Right ventricular systolic function is normal. 3. No left atrial/left atrial appendage thrombus was detected. 4. Faint echos adjacent to the mitral valve most likely represent artifact. No definite vegetation is seen. The mitral valve is degenerative. Mild mitral valve regurgitation. 5. The aortic valve is tricuspid. There is moderate calcification of the aortic valve. There is mild thickening of the aortic valve. Aortic valve regurgitation is not visualized.  Treatments: antibiotics  Discharge Exam: Blood pressure 140/63, pulse 98, temperature 98.7 F (37.1 C), temperature source Oral, resp. rate 18, height 5\' 3"  (1.6 m), weight 98.9 kg, SpO2 99 %. General appearance: alert, cooperative and Oriented to place and person. Resp: clear to auscultation  bilaterally Cardio: regular rate and rhythm, S1, S2 normal, no murmur, click, rub or gallop GI: soft, non-tender; bowel sounds normal; no masses,  no organomegaly Extremities: extremities normal, atraumatic, no cyanosis or edema  Neurologic exam showed significant aphasia, no focal weakness.  Disposition: Discharge disposition: 03-Skilled Nursing Facility       Discharge Instructions    AMB referral to wound care center   Complete by: As directed    Diet - low sodium heart healthy   Complete by: As directed    Discharge wound care:   Complete by: As directed    RN dressing changes.   Increase activity slowly   Complete by: As directed      Allergies as of 05/07/2020      Reactions   Morphine And Related Anaphylaxis   Reglan [metoclopramide]    Other reaction(s): Unknown Elevated BP   Simvastatin Other (See Comments)   Muscle pain   Betadine [povidone Iodine] Rash   Tetracyclines & Related Rash   Other reaction(s): Unknown      Medication List    STOP taking these medications   HYDROcodone-acetaminophen 5-325 MG tablet Commonly known as: Norco     TAKE these medications   amLODipine 10 MG tablet Commonly known as: NORVASC TAKE 1 TABLET BY MOUTH  DAILY   aspirin 81 MG chewable tablet Chew 1 tablet (81 mg total) by mouth daily.   atorvastatin 20 MG tablet Commonly known as: LIPITOR TAKE 1 TABLET BY MOUTH  DAILY What changed: when to take this   Cavilon Durable Barrier 1.3 % Crea Generic drug: Dimethicone Apply barrier cream PRN   clopidogrel 75 MG tablet Commonly  known as: PLAVIX TAKE 1 TABLET BY MOUTH  DAILY   enalapril 20 MG tablet Commonly known as: VASOTEC Take 1 tablet (20 mg total) by mouth 2 (two) times daily.   gabapentin 100 MG capsule Commonly known as: NEURONTIN Take 200 mg by mouth 2 (two) times daily.   hydrALAZINE 100 MG tablet Commonly known as: APRESOLINE Take 1 tablet (100 mg total) by mouth 3 (three) times daily. What  changed: when to take this   levETIRAcetam 500 MG tablet Commonly known as: KEPPRA TAKE 1 TABLET BY MOUTH 2  TIMES DAILY   linezolid 600 MG tablet Commonly known as: Zyvox Take 1 tablet (600 mg total) by mouth 2 (two) times daily for 3 days.   metFORMIN 1000 MG tablet Commonly known as: GLUCOPHAGE TAKE 1 TABLET BY MOUTH  TWICE DAILY   metoprolol succinate 100 MG 24 hr tablet Commonly known as: TOPROL-XL TAKE 1 TABLET BY MOUTH  DAILY   nitroGLYCERIN 0.4 MG SL tablet Commonly known as: NITROSTAT Place 1 tablet (0.4 mg total) under the tongue every 5 (five) minutes as needed for chest pain.   ondansetron 4 MG tablet Commonly known as: Zofran Take 1 tablet (4 mg total) by mouth every 8 (eight) hours as needed for nausea or vomiting.   pantoprazole 40 MG tablet Commonly known as: PROTONIX TAKE 1 TABLET BY MOUTH  DAILY   Tresiba FlexTouch 100 UNIT/ML FlexTouch Pen Generic drug: insulin degludec ADMINISTER UP TO 30 UNITS UNDER THE SKIN DAILY            Discharge Care Instructions  (From admission, onward)         Start     Ordered   05/07/20 0000  Discharge wound care:       Comments: RN dressing changes.   05/07/20 1319          Contact information for after-discharge care    Destination    [facility] Preferred SNF .   Service: Skilled Nursing Contact information: [address] [place] [phone]                 35 minutes Signed: Sharen Hones 05/07/2020, 1:20 PM

## 2020-05-08 ENCOUNTER — Encounter: Payer: Self-pay | Admitting: Internal Medicine

## 2020-05-08 DIAGNOSIS — R4701 Aphasia: Secondary | ICD-10-CM | POA: Diagnosis not present

## 2020-05-08 DIAGNOSIS — A4101 Sepsis due to Methicillin susceptible Staphylococcus aureus: Secondary | ICD-10-CM | POA: Diagnosis not present

## 2020-05-08 DIAGNOSIS — E1110 Type 2 diabetes mellitus with ketoacidosis without coma: Secondary | ICD-10-CM | POA: Diagnosis not present

## 2020-05-08 DIAGNOSIS — N171 Acute kidney failure with acute cortical necrosis: Secondary | ICD-10-CM | POA: Diagnosis not present

## 2020-05-08 LAB — CBC
HCT: 25.3 % — ABNORMAL LOW (ref 36.0–46.0)
Hemoglobin: 8.3 g/dL — ABNORMAL LOW (ref 12.0–15.0)
MCH: 27.9 pg (ref 26.0–34.0)
MCHC: 32.8 g/dL (ref 30.0–36.0)
MCV: 84.9 fL (ref 80.0–100.0)
Platelets: 386 10*3/uL (ref 150–400)
RBC: 2.98 MIL/uL — ABNORMAL LOW (ref 3.87–5.11)
RDW: 15.4 % (ref 11.5–15.5)
WBC: 12.3 10*3/uL — ABNORMAL HIGH (ref 4.0–10.5)
nRBC: 0 % (ref 0.0–0.2)

## 2020-05-08 LAB — GLUCOSE, CAPILLARY
Glucose-Capillary: 95 mg/dL (ref 70–99)
Glucose-Capillary: 140 mg/dL — ABNORMAL HIGH (ref 70–99)
Glucose-Capillary: 210 mg/dL — ABNORMAL HIGH (ref 70–99)
Glucose-Capillary: 98 mg/dL (ref 70–99)
Glucose-Capillary: 106 mg/dL — ABNORMAL HIGH (ref 70–99)

## 2020-05-08 MED ORDER — FAMOTIDINE 20 MG PO TABS: 20.0000 mg | ORAL_TABLET | Freq: Every day | ORAL | 0 refills | Status: DC

## 2020-05-08 NOTE — TOC Progression Note (Addendum)
Transition of Care [facility]) - Progression Note    Patient Details  Name: Susan House MRN: [id number] Date of Birth: [birth year]  Transition of Care [facility]) CM/SW [place], RN Phone Number: 05/08/2020, 10:24 AM  Clinical Narrative:    Remains pending in [place] updated PT notes regarding ADL status and current cognitive status. RN CM requested PT visit today for updated notes and uploaded recent dc summary to [facility]. Will send updated PT notes once completed as patient is medically cleared for discharge pending insurance approval.   11:20: Uploaded PT notes and updated dc summary to insurance [employer]. Called and confirmed information was received and being reviewed.      Barriers to Discharge: No Barriers Identified  Expected Discharge Plan and Services           Expected Discharge Date: 05/07/20                                     Social Determinants of Health (SDOH) Interventions    Readmission Risk Interventions No flowsheet data found.

## 2020-05-08 NOTE — Discharge Summary (Signed)
Physician Discharge Summary  Patient ID: Susan House MRN: [id number] DOB/AGE: [birth year] 64 y.o.  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 She has been having decreased energy level and vomiting at home. She was tachycardic and tachypneic upon arrival. She was having extensive fecal material around her and several bruises.  Upon presentation to the emergency room blood pressure was167/92 with a heart rate of 126 and respiratory to 26 with pulse symmetry of 98% on room air.Labs revealed ABG with pH 7.37 and HCO3 of 12.7 and BMP revealed a CO2 of 14 with a blood glucose of 448 and a creatinine of 1.9 with anion gap of 23 magnesium of 1.3 and total bili of 2.1 with lactic acid of 2.1 later 2.3 and CBC showed leukocytosis 11.4 with relative neutrophilia. Beta hydroxybutyrate was 7.11. COVID-19 PCR and influenza antigens came back negative. UA showed more than 50 WBCs more than 300 protein and 20 ketones and more than 500 glucose.   Since admission to the hospital, she has positive urine culture showed multiple species, blood culture was positive with 4 different organisms including MSSA, Staph hominis, static coccus epidermidis  and Enterococcus faecalis. Patient has been seen by ID, presumed MSSA,Enterococcus faecalisaretrueinfections;others may be contaminants. Repeated blood culture was negative. Patient covered with daptomycin. Also recommended TEE from ID.  Patient also had DKA, require insulin drip. Condition had improved.  Patient seem to have worsening aphasia since 10/19. Was seen by neurology. MRI of the brainis negative for acute stroke.  10/22.  TEE did not show any vegetation.  Discussed with ID, patient will only need 3 days of Zyvox.  #1. Severe sepsis with MSSA and Enterococcus septicemia. TEE did not show any vegetation. Discussed with ID, patient will only need 3 days of Zyvox at this point.  Medically stable to be discharged.  2. Acute encephalopathy and aphasia. Aphasia appears to be secondary to prior stroke. Reviewed MRI results, no acute stroke. Patient mental status seem to be improving.  3. Acute kidney injury on chronic kidney disease stage IIIa. Renal function improved  4. Hypokalemia. Improved.  5. Anemia of chronic disease with iron deficient anemia. B12 level borderline, homocystine level normal. Received IV Iron  6. Moderate protein and calorie malnutrition. Continue supplement in SNF.   7. DKA. Resolved.  Patient was scheduled to be transferred to  nursing home on 10/22, which was delayed by insurance.  Patient is still medically stable to be transferred today.  Of note, insurance was concerned of mention of DVT by physical therapist, I examined the patient again today, there is no edema in her arms and legs, there is no concern for DVT.   Consults: ID and neurology  Significant Diagnostic Studies:  Significant Diagnostic Studies:  MRI HEAD WITHOUT CONTRAST  TECHNIQUE: Multiplanar, multiecho pulse sequences of the  brain and surrounding structures were obtained without intravenous contrast.  COMPARISON: Head CT May 04, 2020.  FINDINGS: Brain: No acute infarction, hemorrhage, hydrocephalus, extra-axial collection or mass lesion.  Remote infarct involving the left lenticulocapsular region, left thalamus and deep white matter of the left frontal lobe with associated retraction of left lateral ventricle. Remote infarct is also seen in the posterior limb of the right internal capsule.  Scattered foci of T2 hyperintensity are seen within the white matter of the cerebral hemispheres, nonspecific, most likely related to chronic small vessel ischemia.  Moderate diffuse parenchymal volume loss, more pronounced at the cerebral peduncles and pons, may be related to wallerian degeneration.  Vascular: Normal flow voids.  Skull and upper cervical spine: Normal marrow signal.  Sinuses/Orbits: Bilateral lens surgery mucosal thickening of the bilateral ethmoid cells.  Other: None.  IMPRESSION: 1. No acute intracranial abnormality. 2. Remote infarct involving the left lenticulo capsular region, left thalamus and deep white matter of the left frontal lobe and in the posterior limb of the right internal capsule. 3. 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 4. Mild chronic small vessel ischemia.   Electronically Signed By: Pedro Earls M.D. On: 05/05/2020 12:34  TEE: 1. Left ventricular ejection fraction, by estimation, is 65 to 70%. The left ventricle has normal function. 2. Right ventricular systolic function is normal. 3. No left atrial/left atrial appendage thrombus was detected. 4. Faint echos adjacent to the mitral valve most likely represent artifact. No definite vegetation is seen. The mitral valve is degenerative. Mild mitral valve regurgitation. 5. The aortic valve is tricuspid. There is moderate calcification of the aortic valve. There is mild thickening of the  aortic valve. Aortic valve regurgitation is not visualized.   Treatments: antibiotics  Discharge Exam: Blood pressure (!) 167/68, pulse 93, temperature 98.2 F (36.8 C), temperature source Oral, resp. rate 19, height 5\' 3"  (1.6 m), weight 100.8 kg, SpO2 98 %. General appearance: alert and cooperative Resp: clear to auscultation bilaterally Cardio: regular rate and rhythm, S1, S2 normal, no murmur, click, rub or gallop GI: soft, non-tender; bowel sounds normal; no masses,  no organomegaly Extremities: extremities normal, atraumatic, no cyanosis or edema  Patient still has significant aphasia, no focal weakness.  Disposition: Discharge disposition: 01-Home or Self Care       Discharge Instructions    AMB referral to wound care center   Complete by: As directed    Diet - low sodium heart healthy   Complete by: As directed    Diet - low sodium heart healthy   Complete by: As directed    Discharge wound care:   Complete by: As directed    RN dressing changes.   Discharge wound care:   Complete by: As directed    RN dressing changes, refer to wc   Increase activity slowly   Complete by: As directed    Increase activity slowly   Complete by: As directed      Allergies as of 05/08/2020      Reactions   Morphine And Related Anaphylaxis   Reglan [metoclopramide]    Other reaction(s): Unknown Elevated BP   Simvastatin Other (See Comments)   Muscle pain   Betadine [povidone Iodine] Rash   Tetracyclines & Related Rash   Other reaction(s): Unknown      Medication List    STOP taking these medications   HYDROcodone-acetaminophen 5-325 MG tablet Commonly  known as: Norco     TAKE these medications   amLODipine 10 MG tablet Commonly known as: NORVASC TAKE 1 TABLET BY MOUTH  DAILY   aspirin 81 MG chewable tablet Chew 1 tablet (81 mg total) by mouth daily.   atorvastatin 20 MG tablet Commonly known as: LIPITOR TAKE 1 TABLET BY MOUTH  DAILY What changed: when to  take this   Cavilon Durable Barrier 1.3 % Crea Generic drug: Dimethicone Apply barrier cream PRN   clopidogrel 75 MG tablet Commonly known as: PLAVIX TAKE 1 TABLET BY MOUTH  DAILY   enalapril 20 MG tablet Commonly known as: VASOTEC Take 1 tablet (20 mg total) by mouth 2 (two) times daily.   famotidine 20 MG tablet Commonly known as: PEPCID Take 1 tablet (20 mg total) by mouth daily.   gabapentin 100 MG capsule Commonly known as: NEURONTIN Take 200 mg by mouth 2 (two) times daily.   hydrALAZINE 100 MG tablet Commonly known as: APRESOLINE Take 1 tablet (100 mg total) by mouth 3 (three) times daily. What changed: when to take this   levETIRAcetam 500 MG tablet Commonly known as: KEPPRA TAKE 1 TABLET BY MOUTH 2  TIMES DAILY   linezolid 600 MG tablet Commonly known as: Zyvox Take 1 tablet (600 mg total) by mouth 2 (two) times daily for 3 days.   metFORMIN 1000 MG tablet Commonly known as: GLUCOPHAGE TAKE 1 TABLET BY MOUTH  TWICE DAILY   metoprolol succinate 100 MG 24 hr tablet Commonly known as: TOPROL-XL TAKE 1 TABLET BY MOUTH  DAILY   nitroGLYCERIN 0.4 MG SL tablet Commonly known as: NITROSTAT Place 1 tablet (0.4 mg total) under the tongue every 5 (five) minutes as needed for chest pain.   ondansetron 4 MG tablet Commonly known as: Zofran Take 1 tablet (4 mg total) by mouth every 8 (eight) hours as needed for nausea or vomiting.   pantoprazole 40 MG tablet Commonly known as: PROTONIX TAKE 1 TABLET BY MOUTH  DAILY   Tresiba FlexTouch 100 UNIT/ML FlexTouch Pen Generic drug: insulin degludec ADMINISTER UP TO 30 UNITS UNDER THE SKIN DAILY            Discharge Care Instructions  (From admission, onward)         Start     Ordered   05/08/20 0000  Discharge wound care:       Comments: RN dressing changes, refer to wc   05/08/20 1050   05/07/20 0000  Discharge wound care:       Comments: RN dressing changes.   05/07/20 1319          Contact  information for after-discharge care    Destination    [facility] Preferred SNF .   Service: Skilled Nursing Contact information: [address] [place] [phone]                  Signed: Sharen Hones 05/08/2020, 10:50 AM

## 2020-05-08 NOTE — Progress Notes (Signed)
Physical Therapy Treatment Patient Details Name: Susan House MRN: [id number] DOB: 1955/09/18 Today's Date: 05/08/2020    History of Present Illness 64 y.o. female admitted on 04/27/2020 with bacteremia likely secondary to UTI. PMH for HTN, dyslipidemia, CVA and TIA. Patient was started on ceftriaxone and BCx 1/2 sets (both bottles) with GPC S. Aureus and epidermidis, methicillin resistance detected. Interestingly, culture has growing S. Aureus and E. Faecalis. Pharmacy has been consulted for vancomycin dosing.    PT Comments    Pt was long sitting in bed upon arriving. She is alert and cooperative. Motivated to return to PLOF. Supportive husband present throughout session. Reports pt was able to get OOB and able to ambulate in recent past. Pt agreeable to attempt OOB activity but does endorse stomach pain. Performed supine > sit EOB via log roll technique. Mod assist to roll but max assist to achieve EOB short sit. Sat EOB x > 10 minutes with SBA. Performed several exercises at EOB prior to pt requesting to return to supine 2/2 to pain and fatigue.Pt has flaccid LUE however active movements in LLE. Overall pt tolerated PT session well. She will greatly benefit form continued skilled PT going forward. Recommend DC to SNF to address deficits and improve independence and return to PLOF.  At conclusion of session, pt was in bed with bed alarm in place and call bell in reach.   Follow Up Recommendations  SNF     Equipment Recommendations  None recommended by PT       Precautions / Restrictions Precautions Precautions: Fall Restrictions Weight Bearing Restrictions: No    Mobility  Bed Mobility Overal bed mobility: Needs Assistance Bed Mobility: Supine to Sit Rolling: Mod assist   Supine to sit: Max assist (of 1) Sit to supine: Max assist (of one)   General bed mobility comments: Pt was able to roll L to short sit with increased time and vcs for improved technique. Max assist to  transition form side lying to short sit.  Transfers      General transfer comment: Pt performed exercises EOB however 2/2 to stomach pain and fatigue. Did not want to attempt standing.      Balance Overall balance assessment: Needs assistance Sitting-balance support: Feet supported Sitting balance-Leahy Scale: Fair Sitting balance - Comments: With feet support only. Pt was able to maintain sitting EOB x >10 minutes. Pt does have active strength in LLE however flaccid LUE.             General Comments General comments (skin integrity, edema, etc.): Pt toilerated performing exercises on BLE/Ues in sitting. does fatigue quickly      Pertinent Vitals/Pain Pain Assessment: 0-10 Pain Score: 4  Faces Pain Scale: Hurts a little bit Pain Location: stomach Pain Descriptors / Indicators: Discomfort Pain Intervention(s): Limited activity within patient's tolerance;Monitored during session;Premedicated before session;Repositioned           PT Goals (current goals can now be found in the care plan section) Acute Rehab PT Goals Patient Stated Goal: to go to rehab Progress towards PT goals: Progressing toward goals    Frequency    Min 2X/week      PT Plan Current plan remains appropriate       AM-PAC PT "6 Clicks" Mobility   Outcome Measure  Help needed turning from your back to your side while in a flat bed without using bedrails?: A Lot Help needed moving from lying on your back to sitting on the side of a  flat bed without using bedrails?: A Lot Help needed moving to and from a bed to a chair (including a wheelchair)?: Total Help needed standing up from a chair using your arms (e.g., wheelchair or bedside chair)?: Total Help needed to walk in hospital room?: Total Help needed climbing 3-5 steps with a railing? : Total 6 Click Score: 8    End of Session   Activity Tolerance: Patient limited by fatigue Patient left: in bed;with call bell/phone within reach;with bed  alarm set;with family/visitor present Nurse Communication: Mobility status;Other (comment) PT Visit Diagnosis: Muscle weakness (generalized) (M62.81);Difficulty in walking, not elsewhere classified (R26.2);History of falling (Z91.81);Unsteadiness on feet (R26.81)     Time: 1040-1056 PT Time Calculation (min) (ACUTE ONLY): 16 min  Charges:  $Therapeutic Activity: 8-22 mins                     Julaine Fusi PTA 05/08/20, 11:14 AM

## 2020-05-09 DIAGNOSIS — Z743 Need for continuous supervision: Secondary | ICD-10-CM | POA: Diagnosis not present

## 2020-05-09 DIAGNOSIS — M6281 Muscle weakness (generalized): Secondary | ICD-10-CM | POA: Diagnosis not present

## 2020-05-09 DIAGNOSIS — M2550 Pain in unspecified joint: Secondary | ICD-10-CM | POA: Diagnosis not present

## 2020-05-09 DIAGNOSIS — R52 Pain, unspecified: Secondary | ICD-10-CM | POA: Diagnosis not present

## 2020-05-09 DIAGNOSIS — R404 Transient alteration of awareness: Secondary | ICD-10-CM | POA: Diagnosis not present

## 2020-05-09 DIAGNOSIS — R4701 Aphasia: Secondary | ICD-10-CM | POA: Diagnosis not present

## 2020-05-09 DIAGNOSIS — E1142 Type 2 diabetes mellitus with diabetic polyneuropathy: Secondary | ICD-10-CM | POA: Diagnosis not present

## 2020-05-09 DIAGNOSIS — E1165 Type 2 diabetes mellitus with hyperglycemia: Secondary | ICD-10-CM | POA: Diagnosis not present

## 2020-05-09 DIAGNOSIS — E1110 Type 2 diabetes mellitus with ketoacidosis without coma: Secondary | ICD-10-CM | POA: Diagnosis not present

## 2020-05-09 DIAGNOSIS — A4101 Sepsis due to Methicillin susceptible Staphylococcus aureus: Secondary | ICD-10-CM | POA: Diagnosis not present

## 2020-05-09 DIAGNOSIS — I69898 Other sequelae of other cerebrovascular disease: Secondary | ICD-10-CM | POA: Diagnosis not present

## 2020-05-09 DIAGNOSIS — I10 Essential (primary) hypertension: Secondary | ICD-10-CM | POA: Diagnosis not present

## 2020-05-09 DIAGNOSIS — N390 Urinary tract infection, site not specified: Secondary | ICD-10-CM | POA: Diagnosis not present

## 2020-05-09 DIAGNOSIS — E118 Type 2 diabetes mellitus with unspecified complications: Secondary | ICD-10-CM | POA: Diagnosis not present

## 2020-05-09 DIAGNOSIS — E785 Hyperlipidemia, unspecified: Secondary | ICD-10-CM | POA: Diagnosis not present

## 2020-05-09 DIAGNOSIS — N171 Acute kidney failure with acute cortical necrosis: Secondary | ICD-10-CM | POA: Diagnosis not present

## 2020-05-09 DIAGNOSIS — A4189 Other specified sepsis: Secondary | ICD-10-CM | POA: Diagnosis not present

## 2020-05-09 DIAGNOSIS — Z7401 Bed confinement status: Secondary | ICD-10-CM | POA: Diagnosis not present

## 2020-05-09 LAB — GLUCOSE, CAPILLARY
Glucose-Capillary: 88 mg/dL (ref 70–99)
Glucose-Capillary: 201 mg/dL — ABNORMAL HIGH (ref 70–99)
Glucose-Capillary: 287 mg/dL — ABNORMAL HIGH (ref 70–99)

## 2020-05-09 MED ORDER — OXYCODONE-ACETAMINOPHEN 5-325 MG PO TABS
1.0000 | ORAL_TABLET | Freq: Four times a day (QID) | ORAL | Status: DC | PRN
  Administered 2020-05-09: 1 via ORAL
  Filled 2020-05-09: qty 1

## 2020-05-09 MED ORDER — LINEZOLID 600 MG PO TABS: 600.0000 mg | ORAL_TABLET | Freq: Two times a day (BID) | ORAL | 0 refills | Status: AC

## 2020-05-09 NOTE — Progress Notes (Signed)
Susan House c/o pain and asked for oxycodone that she takes at home. Oxy ordered by MD Roosevelt Locks and this RN administered. Daughter at bedside. Susan House will be transferred to [facility] today. Daughter aware of transition and has spoken to Education officer, museum. Susan House vitals signs WDL throughout shift and at time of planned discharge
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
## 2020-05-09 NOTE — Discharge Summary (Signed)
Physician Discharge Summary  Patient ID: Susan House MRN: [id number] DOB/AGE: [birth year] 64 y.o.  Admit date: 04/27/2020 Discharge date: 05/09/2020  Admission Diagnoses:  Discharge Diagnoses:  Active Problems:   Diabetes mellitus with neurological manifestation (HCC)   Hypokalemia   DKA (diabetic ketoacidosis) (HCC)   Pressure injury of skin   Severe sepsis with acute organ dysfunction due to methicillin susceptible Staphylococcus aureus (MSSA) (HCC)   Aphasia   Acute renal failure with acute renal cortical necrosis superimposed on stage 3a chronic kidney disease ([place])   Bacteremia   Pressure ulcer of sacrum  Please see yesterday's discharge summary for hospital course and other information's. Patient was not discharged yesterday as patient insurance has not approved it yet. At this point, patient will only need 3 doses of Zyvox.  Medicine re-prescribed.   Discharge Exam: Blood pressure (!) 146/61, pulse 77, temperature 98.9 F (37.2 C), temperature source Oral, resp. rate 18, height 5\' 3"  (1.6 m), weight 99.9 kg, SpO2 100 %. General appearance: alert and cooperative Resp: clear to auscultation bilaterally Cardio: regular rate and rhythm, S1, S2 normal, no murmur, click, rub or gallop GI: soft, non-tender; bowel sounds normal; no masses,  no organomegaly Extremities: extremities normal, atraumatic, no cyanosis or edema  Still has significant aphasia.  Disposition: Discharge disposition: 03-Skilled Nursing Facility       Discharge Instructions    AMB referral to wound care center   Complete by: As directed    Diet - low sodium heart healthy   Complete by: As directed    Diet - low sodium heart healthy   Complete by: As directed    Diet - low sodium heart healthy   Complete by: As directed    Discharge wound care:   Complete by: As directed    RN dressing changes.   Discharge wound care:   Complete by: As directed    RN dressing changes, refer to wc    Discharge wound care:   Complete by: As directed    Wound care referred   Increase activity slowly   Complete by: As directed    Increase activity slowly   Complete by: As directed      Allergies as of 05/09/2020      Reactions   Morphine And Related Anaphylaxis   Reglan [metoclopramide]    Other reaction(s): Unknown Elevated BP   Simvastatin Other (See Comments)   Muscle pain   Betadine [povidone Iodine] Rash   Tetracyclines & Related Rash   Other reaction(s): Unknown      Medication List    STOP taking these medications   HYDROcodone-acetaminophen 5-325 MG tablet Commonly known as: Norco     TAKE these medications   amLODipine 10 MG tablet Commonly known as: NORVASC TAKE 1 TABLET BY MOUTH  DAILY   aspirin 81 MG chewable tablet Chew 1 tablet (81 mg total) by mouth daily.   atorvastatin 20 MG tablet Commonly known as: LIPITOR TAKE 1 TABLET BY MOUTH  DAILY What changed: when to take this   Cavilon Durable Barrier 1.3 % Crea Generic drug: Dimethicone Apply barrier cream PRN   clopidogrel 75 MG tablet Commonly known as: PLAVIX TAKE 1 TABLET BY MOUTH  DAILY   enalapril 20 MG tablet Commonly known as: VASOTEC Take 1 tablet (20 mg total) by mouth 2 (two) times daily.   famotidine 20 MG tablet Commonly known as: PEPCID Take 1 tablet (20 mg total) by mouth daily.   gabapentin 100 MG capsule  Commonly known as: NEURONTIN Take 200 mg by mouth 2 (two) times daily.   hydrALAZINE 100 MG tablet Commonly known as: APRESOLINE Take 1 tablet (100 mg total) by mouth 3 (three) times daily. What changed: when to take this   levETIRAcetam 500 MG tablet Commonly known as: KEPPRA TAKE 1 TABLET BY MOUTH 2  TIMES DAILY   linezolid 600 MG tablet Commonly known as: Zyvox Take 1 tablet (600 mg total) by mouth 2 (two) times daily for 3 doses.   metFORMIN 1000 MG tablet Commonly known as: GLUCOPHAGE TAKE 1 TABLET BY MOUTH  TWICE DAILY   metoprolol succinate 100 MG 24 hr  tablet Commonly known as: TOPROL-XL TAKE 1 TABLET BY MOUTH  DAILY   nitroGLYCERIN 0.4 MG SL tablet Commonly known as: NITROSTAT Place 1 tablet (0.4 mg total) under the tongue every 5 (five) minutes as needed for chest pain.   ondansetron 4 MG tablet Commonly known as: Zofran Take 1 tablet (4 mg total) by mouth every 8 (eight) hours as needed for nausea or vomiting.   pantoprazole 40 MG tablet Commonly known as: PROTONIX TAKE 1 TABLET BY MOUTH  DAILY   Tresiba FlexTouch 100 UNIT/ML FlexTouch Pen Generic drug: insulin degludec ADMINISTER UP TO 30 UNITS UNDER THE SKIN DAILY            Discharge Care Instructions  (From admission, onward)         Start     Ordered   05/09/20 0000  Discharge wound care:       Comments: Wound care referred   05/09/20 1300   05/08/20 0000  Discharge wound care:       Comments: RN dressing changes, refer to wc   05/08/20 1050   05/07/20 0000  Discharge wound care:       Comments: RN dressing changes.   05/07/20 1319          Contact information for after-discharge care    Destination    [facility] Preferred SNF .   Service: Skilled Nursing Contact information: [address] [place] [phone]                  Signed: Sharen Hones 05/09/2020, 1:01 PM

## 2020-05-09 NOTE — TOC Transition Note (Addendum)
Transition of Care [facility]) - CM/SW Discharge Note   Patient Details  Name: Susan House MRN: [id number] Date of Birth: 11-Jun-1956  Transition of Care [facility]) CM/SW Contact:  Truitt Merle, LCSW Phone Number: 05/09/2020, 11:25 AM   Clinical Narrative:    Patient medically ready for discharge to [facility] today. Confirmed [facility] authorized and next review 05/11/20. Confirmed discharge plan with patient's daughter Rudy Jew. Per daughter, patient is full vaccinated. Confirmed bed with Tammy in admission at [facility]. Updated Dr. Roosevelt Locks and RN Lysbeth Galas. FL-2 updated and discharge summary to be updated. Patient going to room 607B and report to be called to [phone]. Med necessity form, partial DNR, and facesheet in transportation packet on patient chart. RN Lysbeth Galas arranged EMS. No additional TOC needs at this time. LCSW signing off.   Final next level of care: Skilled Nursing Facility Barriers to Discharge: Barriers Resolved   Patient Goals and CMS Choice        Discharge Placement              Patient chooses bed at: [facility]  Patient to be transferred to facility by: [facility] Name of family member notified: Daughter-Candice Harrod Patient and family notified of of transfer: 05/09/20  Discharge Plan and Services                                     Social Determinants of Health (SDOH) Interventions     Readmission Risk Interventions No flowsheet data found.

## 2020-05-10 ENCOUNTER — Encounter: Payer: Self-pay | Admitting: Internal Medicine

## 2020-05-10 DIAGNOSIS — M6281 Muscle weakness (generalized): Secondary | ICD-10-CM | POA: Diagnosis not present

## 2020-05-10 DIAGNOSIS — I10 Essential (primary) hypertension: Secondary | ICD-10-CM | POA: Diagnosis not present

## 2020-05-10 DIAGNOSIS — N390 Urinary tract infection, site not specified: Secondary | ICD-10-CM | POA: Diagnosis not present

## 2020-05-10 DIAGNOSIS — I69898 Other sequelae of other cerebrovascular disease: Secondary | ICD-10-CM | POA: Diagnosis not present

## 2020-05-12 DIAGNOSIS — R52 Pain, unspecified: Secondary | ICD-10-CM | POA: Diagnosis not present

## 2020-05-12 DIAGNOSIS — M6281 Muscle weakness (generalized): Secondary | ICD-10-CM | POA: Diagnosis not present

## 2020-05-12 DIAGNOSIS — I69898 Other sequelae of other cerebrovascular disease: Secondary | ICD-10-CM | POA: Diagnosis not present

## 2020-05-12 DIAGNOSIS — E118 Type 2 diabetes mellitus with unspecified complications: Secondary | ICD-10-CM | POA: Diagnosis not present

## 2020-05-13 DIAGNOSIS — E118 Type 2 diabetes mellitus with unspecified complications: Secondary | ICD-10-CM | POA: Diagnosis not present

## 2020-05-13 DIAGNOSIS — I69898 Other sequelae of other cerebrovascular disease: Secondary | ICD-10-CM | POA: Diagnosis not present

## 2020-05-13 DIAGNOSIS — I10 Essential (primary) hypertension: Secondary | ICD-10-CM | POA: Diagnosis not present

## 2020-05-14 DIAGNOSIS — A4189 Other specified sepsis: Secondary | ICD-10-CM | POA: Diagnosis not present

## 2020-05-14 DIAGNOSIS — E785 Hyperlipidemia, unspecified: Secondary | ICD-10-CM | POA: Diagnosis not present

## 2020-05-14 DIAGNOSIS — E1165 Type 2 diabetes mellitus with hyperglycemia: Secondary | ICD-10-CM | POA: Diagnosis not present

## 2020-05-17 DIAGNOSIS — I69898 Other sequelae of other cerebrovascular disease: Secondary | ICD-10-CM | POA: Diagnosis not present

## 2020-05-17 DIAGNOSIS — E118 Type 2 diabetes mellitus with unspecified complications: Secondary | ICD-10-CM | POA: Diagnosis not present

## 2020-05-17 DIAGNOSIS — I10 Essential (primary) hypertension: Secondary | ICD-10-CM | POA: Diagnosis not present

## 2020-05-18 DIAGNOSIS — I10 Essential (primary) hypertension: Secondary | ICD-10-CM | POA: Diagnosis not present

## 2020-05-18 DIAGNOSIS — N390 Urinary tract infection, site not specified: Secondary | ICD-10-CM | POA: Diagnosis not present

## 2020-05-18 DIAGNOSIS — E118 Type 2 diabetes mellitus with unspecified complications: Secondary | ICD-10-CM | POA: Diagnosis not present

## 2020-05-18 DIAGNOSIS — I69898 Other sequelae of other cerebrovascular disease: Secondary | ICD-10-CM | POA: Diagnosis not present

## 2020-05-19 ENCOUNTER — Other Ambulatory Visit: Payer: Self-pay | Admitting: Family Medicine

## 2020-05-19 DIAGNOSIS — I10 Essential (primary) hypertension: Secondary | ICD-10-CM

## 2020-05-21 ENCOUNTER — Other Ambulatory Visit: Payer: Self-pay | Admitting: Family Medicine

## 2020-05-21 DIAGNOSIS — I10 Essential (primary) hypertension: Secondary | ICD-10-CM

## 2020-06-03 DIAGNOSIS — A4101 Sepsis due to Methicillin susceptible Staphylococcus aureus: Secondary | ICD-10-CM | POA: Diagnosis not present

## 2020-06-03 DIAGNOSIS — G9340 Encephalopathy, unspecified: Secondary | ICD-10-CM | POA: Diagnosis not present

## 2020-06-03 DIAGNOSIS — E118 Type 2 diabetes mellitus with unspecified complications: Secondary | ICD-10-CM | POA: Diagnosis not present

## 2020-06-03 DIAGNOSIS — E1110 Type 2 diabetes mellitus with ketoacidosis without coma: Secondary | ICD-10-CM | POA: Diagnosis not present

## 2020-06-03 DIAGNOSIS — I2582 Chronic total occlusion of coronary artery: Secondary | ICD-10-CM | POA: Diagnosis not present

## 2020-06-03 DIAGNOSIS — I10 Essential (primary) hypertension: Secondary | ICD-10-CM | POA: Diagnosis not present

## 2020-06-04 DIAGNOSIS — I2582 Chronic total occlusion of coronary artery: Secondary | ICD-10-CM | POA: Diagnosis not present

## 2020-06-04 DIAGNOSIS — E1110 Type 2 diabetes mellitus with ketoacidosis without coma: Secondary | ICD-10-CM | POA: Diagnosis not present

## 2020-06-04 DIAGNOSIS — A4101 Sepsis due to Methicillin susceptible Staphylococcus aureus: Secondary | ICD-10-CM | POA: Diagnosis not present

## 2020-06-04 DIAGNOSIS — G9340 Encephalopathy, unspecified: Secondary | ICD-10-CM | POA: Diagnosis not present

## 2020-06-06 ENCOUNTER — Other Ambulatory Visit: Payer: Self-pay | Admitting: Family Medicine

## 2020-06-06 DIAGNOSIS — G9340 Encephalopathy, unspecified: Secondary | ICD-10-CM | POA: Diagnosis not present

## 2020-06-06 DIAGNOSIS — A4101 Sepsis due to Methicillin susceptible Staphylococcus aureus: Secondary | ICD-10-CM | POA: Diagnosis not present

## 2020-06-06 DIAGNOSIS — E1110 Type 2 diabetes mellitus with ketoacidosis without coma: Secondary | ICD-10-CM | POA: Diagnosis not present

## 2020-06-06 DIAGNOSIS — I2582 Chronic total occlusion of coronary artery: Secondary | ICD-10-CM | POA: Diagnosis not present

## 2020-06-07 DIAGNOSIS — E1110 Type 2 diabetes mellitus with ketoacidosis without coma: Secondary | ICD-10-CM | POA: Diagnosis not present

## 2020-06-07 DIAGNOSIS — G9340 Encephalopathy, unspecified: Secondary | ICD-10-CM | POA: Diagnosis not present

## 2020-06-07 DIAGNOSIS — I2582 Chronic total occlusion of coronary artery: Secondary | ICD-10-CM | POA: Diagnosis not present

## 2020-06-07 DIAGNOSIS — A4101 Sepsis due to Methicillin susceptible Staphylococcus aureus: Secondary | ICD-10-CM | POA: Diagnosis not present

## 2020-06-08 DIAGNOSIS — A4101 Sepsis due to Methicillin susceptible Staphylococcus aureus: Secondary | ICD-10-CM | POA: Diagnosis not present

## 2020-06-08 DIAGNOSIS — I2582 Chronic total occlusion of coronary artery: Secondary | ICD-10-CM | POA: Diagnosis not present

## 2020-06-08 DIAGNOSIS — E1110 Type 2 diabetes mellitus with ketoacidosis without coma: Secondary | ICD-10-CM | POA: Diagnosis not present

## 2020-06-08 DIAGNOSIS — G9340 Encephalopathy, unspecified: Secondary | ICD-10-CM | POA: Diagnosis not present

## 2020-06-09 DIAGNOSIS — I2582 Chronic total occlusion of coronary artery: Secondary | ICD-10-CM | POA: Diagnosis not present

## 2020-06-09 DIAGNOSIS — A4101 Sepsis due to Methicillin susceptible Staphylococcus aureus: Secondary | ICD-10-CM | POA: Diagnosis not present

## 2020-06-09 DIAGNOSIS — G9340 Encephalopathy, unspecified: Secondary | ICD-10-CM | POA: Diagnosis not present

## 2020-06-09 DIAGNOSIS — E1110 Type 2 diabetes mellitus with ketoacidosis without coma: Secondary | ICD-10-CM | POA: Diagnosis not present

## 2020-06-10 DIAGNOSIS — A4101 Sepsis due to Methicillin susceptible Staphylococcus aureus: Secondary | ICD-10-CM | POA: Diagnosis not present

## 2020-06-10 DIAGNOSIS — G9340 Encephalopathy, unspecified: Secondary | ICD-10-CM | POA: Diagnosis not present

## 2020-06-10 DIAGNOSIS — I2582 Chronic total occlusion of coronary artery: Secondary | ICD-10-CM | POA: Diagnosis not present

## 2020-06-10 DIAGNOSIS — E1110 Type 2 diabetes mellitus with ketoacidosis without coma: Secondary | ICD-10-CM | POA: Diagnosis not present

## 2020-06-12 DIAGNOSIS — A4101 Sepsis due to Methicillin susceptible Staphylococcus aureus: Secondary | ICD-10-CM | POA: Diagnosis not present

## 2020-06-12 DIAGNOSIS — G9340 Encephalopathy, unspecified: Secondary | ICD-10-CM | POA: Diagnosis not present

## 2020-06-12 DIAGNOSIS — E1110 Type 2 diabetes mellitus with ketoacidosis without coma: Secondary | ICD-10-CM | POA: Diagnosis not present

## 2020-06-12 DIAGNOSIS — I2582 Chronic total occlusion of coronary artery: Secondary | ICD-10-CM | POA: Diagnosis not present

## 2020-06-14 DIAGNOSIS — E1110 Type 2 diabetes mellitus with ketoacidosis without coma: Secondary | ICD-10-CM | POA: Diagnosis not present

## 2020-06-14 DIAGNOSIS — A4101 Sepsis due to Methicillin susceptible Staphylococcus aureus: Secondary | ICD-10-CM | POA: Diagnosis not present

## 2020-06-14 DIAGNOSIS — I2582 Chronic total occlusion of coronary artery: Secondary | ICD-10-CM | POA: Diagnosis not present

## 2020-06-14 DIAGNOSIS — G9340 Encephalopathy, unspecified: Secondary | ICD-10-CM | POA: Diagnosis not present

## 2020-06-15 ENCOUNTER — Ambulatory Visit: Payer: Medicare Other | Admitting: Neurology

## 2020-06-15 DIAGNOSIS — A4101 Sepsis due to Methicillin susceptible Staphylococcus aureus: Secondary | ICD-10-CM | POA: Diagnosis not present

## 2020-06-15 DIAGNOSIS — G9340 Encephalopathy, unspecified: Secondary | ICD-10-CM | POA: Diagnosis not present

## 2020-06-15 DIAGNOSIS — I2582 Chronic total occlusion of coronary artery: Secondary | ICD-10-CM | POA: Diagnosis not present

## 2020-06-15 DIAGNOSIS — E1110 Type 2 diabetes mellitus with ketoacidosis without coma: Secondary | ICD-10-CM | POA: Diagnosis not present

## 2020-06-16 DIAGNOSIS — R7881 Bacteremia: Secondary | ICD-10-CM | POA: Diagnosis not present

## 2020-06-16 DIAGNOSIS — E1110 Type 2 diabetes mellitus with ketoacidosis without coma: Secondary | ICD-10-CM | POA: Diagnosis not present

## 2020-06-16 DIAGNOSIS — A4101 Sepsis due to Methicillin susceptible Staphylococcus aureus: Secondary | ICD-10-CM | POA: Diagnosis not present

## 2020-06-16 DIAGNOSIS — I2582 Chronic total occlusion of coronary artery: Secondary | ICD-10-CM | POA: Diagnosis not present

## 2020-06-17 DIAGNOSIS — R197 Diarrhea, unspecified: Secondary | ICD-10-CM | POA: Diagnosis not present

## 2020-06-17 DIAGNOSIS — I10 Essential (primary) hypertension: Secondary | ICD-10-CM | POA: Diagnosis not present

## 2020-06-17 DIAGNOSIS — A4101 Sepsis due to Methicillin susceptible Staphylococcus aureus: Secondary | ICD-10-CM | POA: Diagnosis not present

## 2020-06-17 DIAGNOSIS — I2582 Chronic total occlusion of coronary artery: Secondary | ICD-10-CM | POA: Diagnosis not present

## 2020-06-17 DIAGNOSIS — R7881 Bacteremia: Secondary | ICD-10-CM | POA: Diagnosis not present

## 2020-06-17 DIAGNOSIS — E1165 Type 2 diabetes mellitus with hyperglycemia: Secondary | ICD-10-CM | POA: Diagnosis not present

## 2020-06-17 DIAGNOSIS — E1110 Type 2 diabetes mellitus with ketoacidosis without coma: Secondary | ICD-10-CM | POA: Diagnosis not present

## 2020-06-17 DIAGNOSIS — R1084 Generalized abdominal pain: Secondary | ICD-10-CM | POA: Diagnosis not present

## 2020-06-18 DIAGNOSIS — A4101 Sepsis due to Methicillin susceptible Staphylococcus aureus: Secondary | ICD-10-CM | POA: Diagnosis not present

## 2020-06-18 DIAGNOSIS — I2582 Chronic total occlusion of coronary artery: Secondary | ICD-10-CM | POA: Diagnosis not present

## 2020-06-18 DIAGNOSIS — R7881 Bacteremia: Secondary | ICD-10-CM | POA: Diagnosis not present

## 2020-06-18 DIAGNOSIS — E1110 Type 2 diabetes mellitus with ketoacidosis without coma: Secondary | ICD-10-CM | POA: Diagnosis not present

## 2020-06-19 DIAGNOSIS — R7881 Bacteremia: Secondary | ICD-10-CM | POA: Diagnosis not present

## 2020-06-19 DIAGNOSIS — E1110 Type 2 diabetes mellitus with ketoacidosis without coma: Secondary | ICD-10-CM | POA: Diagnosis not present

## 2020-06-19 DIAGNOSIS — I2582 Chronic total occlusion of coronary artery: Secondary | ICD-10-CM | POA: Diagnosis not present

## 2020-06-19 DIAGNOSIS — A4101 Sepsis due to Methicillin susceptible Staphylococcus aureus: Secondary | ICD-10-CM | POA: Diagnosis not present

## 2020-06-20 DIAGNOSIS — R1084 Generalized abdominal pain: Secondary | ICD-10-CM | POA: Diagnosis not present

## 2020-06-20 DIAGNOSIS — R197 Diarrhea, unspecified: Secondary | ICD-10-CM | POA: Diagnosis not present

## 2020-06-20 DIAGNOSIS — E1165 Type 2 diabetes mellitus with hyperglycemia: Secondary | ICD-10-CM | POA: Diagnosis not present

## 2020-06-20 DIAGNOSIS — I10 Essential (primary) hypertension: Secondary | ICD-10-CM | POA: Diagnosis not present

## 2020-06-21 DIAGNOSIS — I2582 Chronic total occlusion of coronary artery: Secondary | ICD-10-CM | POA: Diagnosis not present

## 2020-06-21 DIAGNOSIS — A4101 Sepsis due to Methicillin susceptible Staphylococcus aureus: Secondary | ICD-10-CM | POA: Diagnosis not present

## 2020-06-21 DIAGNOSIS — R7881 Bacteremia: Secondary | ICD-10-CM | POA: Diagnosis not present

## 2020-06-21 DIAGNOSIS — E1110 Type 2 diabetes mellitus with ketoacidosis without coma: Secondary | ICD-10-CM | POA: Diagnosis not present

## 2020-06-22 DIAGNOSIS — E1110 Type 2 diabetes mellitus with ketoacidosis without coma: Secondary | ICD-10-CM | POA: Diagnosis not present

## 2020-06-22 DIAGNOSIS — I2582 Chronic total occlusion of coronary artery: Secondary | ICD-10-CM | POA: Diagnosis not present

## 2020-06-22 DIAGNOSIS — R7881 Bacteremia: Secondary | ICD-10-CM | POA: Diagnosis not present

## 2020-06-22 DIAGNOSIS — A4101 Sepsis due to Methicillin susceptible Staphylococcus aureus: Secondary | ICD-10-CM | POA: Diagnosis not present

## 2020-06-23 DIAGNOSIS — A4101 Sepsis due to Methicillin susceptible Staphylococcus aureus: Secondary | ICD-10-CM | POA: Diagnosis not present

## 2020-06-23 DIAGNOSIS — E1110 Type 2 diabetes mellitus with ketoacidosis without coma: Secondary | ICD-10-CM | POA: Diagnosis not present

## 2020-06-23 DIAGNOSIS — R7881 Bacteremia: Secondary | ICD-10-CM | POA: Diagnosis not present

## 2020-06-23 DIAGNOSIS — I2582 Chronic total occlusion of coronary artery: Secondary | ICD-10-CM | POA: Diagnosis not present

## 2020-06-24 DIAGNOSIS — I2582 Chronic total occlusion of coronary artery: Secondary | ICD-10-CM | POA: Diagnosis not present

## 2020-06-24 DIAGNOSIS — A4101 Sepsis due to Methicillin susceptible Staphylococcus aureus: Secondary | ICD-10-CM | POA: Diagnosis not present

## 2020-06-24 DIAGNOSIS — R7881 Bacteremia: Secondary | ICD-10-CM | POA: Diagnosis not present

## 2020-06-24 DIAGNOSIS — E1110 Type 2 diabetes mellitus with ketoacidosis without coma: Secondary | ICD-10-CM | POA: Diagnosis not present

## 2020-06-25 DIAGNOSIS — I2582 Chronic total occlusion of coronary artery: Secondary | ICD-10-CM | POA: Diagnosis not present

## 2020-06-25 DIAGNOSIS — A4101 Sepsis due to Methicillin susceptible Staphylococcus aureus: Secondary | ICD-10-CM | POA: Diagnosis not present

## 2020-06-25 DIAGNOSIS — R7881 Bacteremia: Secondary | ICD-10-CM | POA: Diagnosis not present

## 2020-06-25 DIAGNOSIS — E1110 Type 2 diabetes mellitus with ketoacidosis without coma: Secondary | ICD-10-CM | POA: Diagnosis not present

## 2020-06-28 DIAGNOSIS — I2582 Chronic total occlusion of coronary artery: Secondary | ICD-10-CM | POA: Diagnosis not present

## 2020-06-28 DIAGNOSIS — R7881 Bacteremia: Secondary | ICD-10-CM | POA: Diagnosis not present

## 2020-06-28 DIAGNOSIS — E1110 Type 2 diabetes mellitus with ketoacidosis without coma: Secondary | ICD-10-CM | POA: Diagnosis not present

## 2020-06-28 DIAGNOSIS — A4101 Sepsis due to Methicillin susceptible Staphylococcus aureus: Secondary | ICD-10-CM | POA: Diagnosis not present

## 2020-06-29 DIAGNOSIS — E1110 Type 2 diabetes mellitus with ketoacidosis without coma: Secondary | ICD-10-CM | POA: Diagnosis not present

## 2020-06-29 DIAGNOSIS — R7881 Bacteremia: Secondary | ICD-10-CM | POA: Diagnosis not present

## 2020-06-29 DIAGNOSIS — E1165 Type 2 diabetes mellitus with hyperglycemia: Secondary | ICD-10-CM | POA: Diagnosis not present

## 2020-06-29 DIAGNOSIS — I10 Essential (primary) hypertension: Secondary | ICD-10-CM | POA: Diagnosis not present

## 2020-06-29 DIAGNOSIS — M6281 Muscle weakness (generalized): Secondary | ICD-10-CM | POA: Diagnosis not present

## 2020-06-29 DIAGNOSIS — I2582 Chronic total occlusion of coronary artery: Secondary | ICD-10-CM | POA: Diagnosis not present

## 2020-06-29 DIAGNOSIS — M79674 Pain in right toe(s): Secondary | ICD-10-CM | POA: Diagnosis not present

## 2020-06-29 DIAGNOSIS — A4101 Sepsis due to Methicillin susceptible Staphylococcus aureus: Secondary | ICD-10-CM | POA: Diagnosis not present

## 2020-06-30 DIAGNOSIS — R7881 Bacteremia: Secondary | ICD-10-CM | POA: Diagnosis not present

## 2020-06-30 DIAGNOSIS — E1110 Type 2 diabetes mellitus with ketoacidosis without coma: Secondary | ICD-10-CM | POA: Diagnosis not present

## 2020-06-30 DIAGNOSIS — A4101 Sepsis due to Methicillin susceptible Staphylococcus aureus: Secondary | ICD-10-CM | POA: Diagnosis not present

## 2020-06-30 DIAGNOSIS — I2582 Chronic total occlusion of coronary artery: Secondary | ICD-10-CM | POA: Diagnosis not present

## 2020-06-30 DIAGNOSIS — M79671 Pain in right foot: Secondary | ICD-10-CM | POA: Diagnosis not present

## 2020-07-01 DIAGNOSIS — A4101 Sepsis due to Methicillin susceptible Staphylococcus aureus: Secondary | ICD-10-CM | POA: Diagnosis not present

## 2020-07-01 DIAGNOSIS — R7881 Bacteremia: Secondary | ICD-10-CM | POA: Diagnosis not present

## 2020-07-01 DIAGNOSIS — E1110 Type 2 diabetes mellitus with ketoacidosis without coma: Secondary | ICD-10-CM | POA: Diagnosis not present

## 2020-07-01 DIAGNOSIS — I2582 Chronic total occlusion of coronary artery: Secondary | ICD-10-CM | POA: Diagnosis not present

## 2020-07-02 DIAGNOSIS — E1110 Type 2 diabetes mellitus with ketoacidosis without coma: Secondary | ICD-10-CM | POA: Diagnosis not present

## 2020-07-02 DIAGNOSIS — R7881 Bacteremia: Secondary | ICD-10-CM | POA: Diagnosis not present

## 2020-07-02 DIAGNOSIS — I2582 Chronic total occlusion of coronary artery: Secondary | ICD-10-CM | POA: Diagnosis not present

## 2020-07-02 DIAGNOSIS — A4101 Sepsis due to Methicillin susceptible Staphylococcus aureus: Secondary | ICD-10-CM | POA: Diagnosis not present

## 2020-07-04 DIAGNOSIS — A4101 Sepsis due to Methicillin susceptible Staphylococcus aureus: Secondary | ICD-10-CM | POA: Diagnosis not present

## 2020-07-04 DIAGNOSIS — I2582 Chronic total occlusion of coronary artery: Secondary | ICD-10-CM | POA: Diagnosis not present

## 2020-07-04 DIAGNOSIS — R7881 Bacteremia: Secondary | ICD-10-CM | POA: Diagnosis not present

## 2020-07-04 DIAGNOSIS — E1110 Type 2 diabetes mellitus with ketoacidosis without coma: Secondary | ICD-10-CM | POA: Diagnosis not present

## 2020-07-05 ENCOUNTER — Encounter: Payer: Self-pay | Admitting: Adult Health

## 2020-07-05 ENCOUNTER — Ambulatory Visit: Payer: Medicare Other | Admitting: Adult Health

## 2020-07-05 DIAGNOSIS — R7881 Bacteremia: Secondary | ICD-10-CM | POA: Diagnosis not present

## 2020-07-05 DIAGNOSIS — E1110 Type 2 diabetes mellitus with ketoacidosis without coma: Secondary | ICD-10-CM | POA: Diagnosis not present

## 2020-07-05 DIAGNOSIS — I2582 Chronic total occlusion of coronary artery: Secondary | ICD-10-CM | POA: Diagnosis not present

## 2020-07-05 DIAGNOSIS — A4101 Sepsis due to Methicillin susceptible Staphylococcus aureus: Secondary | ICD-10-CM | POA: Diagnosis not present

## 2020-07-06 DIAGNOSIS — R7881 Bacteremia: Secondary | ICD-10-CM | POA: Diagnosis not present

## 2020-07-06 DIAGNOSIS — I2582 Chronic total occlusion of coronary artery: Secondary | ICD-10-CM | POA: Diagnosis not present

## 2020-07-06 DIAGNOSIS — E1110 Type 2 diabetes mellitus with ketoacidosis without coma: Secondary | ICD-10-CM | POA: Diagnosis not present

## 2020-07-06 DIAGNOSIS — A4101 Sepsis due to Methicillin susceptible Staphylococcus aureus: Secondary | ICD-10-CM | POA: Diagnosis not present

## 2020-07-07 DIAGNOSIS — R7881 Bacteremia: Secondary | ICD-10-CM | POA: Diagnosis not present

## 2020-07-07 DIAGNOSIS — A4101 Sepsis due to Methicillin susceptible Staphylococcus aureus: Secondary | ICD-10-CM | POA: Diagnosis not present

## 2020-07-07 DIAGNOSIS — I2582 Chronic total occlusion of coronary artery: Secondary | ICD-10-CM | POA: Diagnosis not present

## 2020-07-07 DIAGNOSIS — E1110 Type 2 diabetes mellitus with ketoacidosis without coma: Secondary | ICD-10-CM | POA: Diagnosis not present

## 2020-07-08 DIAGNOSIS — E1110 Type 2 diabetes mellitus with ketoacidosis without coma: Secondary | ICD-10-CM | POA: Diagnosis not present

## 2020-07-08 DIAGNOSIS — R7881 Bacteremia: Secondary | ICD-10-CM | POA: Diagnosis not present

## 2020-07-08 DIAGNOSIS — A4101 Sepsis due to Methicillin susceptible Staphylococcus aureus: Secondary | ICD-10-CM | POA: Diagnosis not present

## 2020-07-08 DIAGNOSIS — I2582 Chronic total occlusion of coronary artery: Secondary | ICD-10-CM | POA: Diagnosis not present

## 2020-07-09 DIAGNOSIS — A4101 Sepsis due to Methicillin susceptible Staphylococcus aureus: Secondary | ICD-10-CM | POA: Diagnosis not present

## 2020-07-09 DIAGNOSIS — R7881 Bacteremia: Secondary | ICD-10-CM | POA: Diagnosis not present

## 2020-07-09 DIAGNOSIS — I2582 Chronic total occlusion of coronary artery: Secondary | ICD-10-CM | POA: Diagnosis not present

## 2020-07-09 DIAGNOSIS — E1110 Type 2 diabetes mellitus with ketoacidosis without coma: Secondary | ICD-10-CM | POA: Diagnosis not present

## 2020-07-12 DIAGNOSIS — I2582 Chronic total occlusion of coronary artery: Secondary | ICD-10-CM | POA: Diagnosis not present

## 2020-07-12 DIAGNOSIS — A4101 Sepsis due to Methicillin susceptible Staphylococcus aureus: Secondary | ICD-10-CM | POA: Diagnosis not present

## 2020-07-12 DIAGNOSIS — R7881 Bacteremia: Secondary | ICD-10-CM | POA: Diagnosis not present

## 2020-07-12 DIAGNOSIS — E1110 Type 2 diabetes mellitus with ketoacidosis without coma: Secondary | ICD-10-CM | POA: Diagnosis not present

## 2020-07-13 DIAGNOSIS — A4101 Sepsis due to Methicillin susceptible Staphylococcus aureus: Secondary | ICD-10-CM | POA: Diagnosis not present

## 2020-07-13 DIAGNOSIS — R7881 Bacteremia: Secondary | ICD-10-CM | POA: Diagnosis not present

## 2020-07-13 DIAGNOSIS — E1110 Type 2 diabetes mellitus with ketoacidosis without coma: Secondary | ICD-10-CM | POA: Diagnosis not present

## 2020-07-13 DIAGNOSIS — I2582 Chronic total occlusion of coronary artery: Secondary | ICD-10-CM | POA: Diagnosis not present

## 2020-07-14 DIAGNOSIS — I2582 Chronic total occlusion of coronary artery: Secondary | ICD-10-CM | POA: Diagnosis not present

## 2020-07-14 DIAGNOSIS — M6281 Muscle weakness (generalized): Secondary | ICD-10-CM | POA: Diagnosis not present

## 2020-07-14 DIAGNOSIS — A4101 Sepsis due to Methicillin susceptible Staphylococcus aureus: Secondary | ICD-10-CM | POA: Diagnosis not present

## 2020-07-14 DIAGNOSIS — R7881 Bacteremia: Secondary | ICD-10-CM | POA: Diagnosis not present

## 2020-07-14 DIAGNOSIS — I69898 Other sequelae of other cerebrovascular disease: Secondary | ICD-10-CM | POA: Diagnosis not present

## 2020-07-14 DIAGNOSIS — I10 Essential (primary) hypertension: Secondary | ICD-10-CM | POA: Diagnosis not present

## 2020-07-14 DIAGNOSIS — E1110 Type 2 diabetes mellitus with ketoacidosis without coma: Secondary | ICD-10-CM | POA: Diagnosis not present

## 2020-07-15 ENCOUNTER — Other Ambulatory Visit: Payer: Self-pay | Admitting: Family Medicine

## 2020-07-15 DIAGNOSIS — E1110 Type 2 diabetes mellitus with ketoacidosis without coma: Secondary | ICD-10-CM | POA: Diagnosis not present

## 2020-07-15 DIAGNOSIS — A4101 Sepsis due to Methicillin susceptible Staphylococcus aureus: Secondary | ICD-10-CM | POA: Diagnosis not present

## 2020-07-15 DIAGNOSIS — I2582 Chronic total occlusion of coronary artery: Secondary | ICD-10-CM | POA: Diagnosis not present

## 2020-07-15 DIAGNOSIS — I10 Essential (primary) hypertension: Secondary | ICD-10-CM

## 2020-07-15 DIAGNOSIS — R7881 Bacteremia: Secondary | ICD-10-CM | POA: Diagnosis not present

## 2020-07-16 ENCOUNTER — Other Ambulatory Visit: Payer: Self-pay | Admitting: Family Medicine

## 2020-07-16 DIAGNOSIS — A4101 Sepsis due to Methicillin susceptible Staphylococcus aureus: Secondary | ICD-10-CM | POA: Diagnosis not present

## 2020-07-16 DIAGNOSIS — R7881 Bacteremia: Secondary | ICD-10-CM | POA: Diagnosis not present

## 2020-07-16 DIAGNOSIS — E1110 Type 2 diabetes mellitus with ketoacidosis without coma: Secondary | ICD-10-CM | POA: Diagnosis not present

## 2020-07-16 DIAGNOSIS — I2582 Chronic total occlusion of coronary artery: Secondary | ICD-10-CM | POA: Diagnosis not present

## 2020-07-19 DIAGNOSIS — A4101 Sepsis due to Methicillin susceptible Staphylococcus aureus: Secondary | ICD-10-CM | POA: Diagnosis not present

## 2020-07-19 DIAGNOSIS — G9340 Encephalopathy, unspecified: Secondary | ICD-10-CM | POA: Diagnosis not present

## 2020-07-19 DIAGNOSIS — E1110 Type 2 diabetes mellitus with ketoacidosis without coma: Secondary | ICD-10-CM | POA: Diagnosis not present
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
## 2020-07-20 DIAGNOSIS — M6281 Muscle weakness (generalized): Secondary | ICD-10-CM | POA: Diagnosis not present

## 2020-07-20 DIAGNOSIS — U071 COVID-19: Secondary | ICD-10-CM | POA: Diagnosis not present

## 2020-07-20 DIAGNOSIS — I10 Essential (primary) hypertension: Secondary | ICD-10-CM | POA: Diagnosis not present

## 2020-07-20 DIAGNOSIS — E1110 Type 2 diabetes mellitus with ketoacidosis without coma: Secondary | ICD-10-CM | POA: Diagnosis not present

## 2020-07-20 DIAGNOSIS — A4101 Sepsis due to Methicillin susceptible Staphylococcus aureus: Secondary | ICD-10-CM | POA: Diagnosis not present

## 2020-07-20 DIAGNOSIS — G9340 Encephalopathy, unspecified: Secondary | ICD-10-CM | POA: Diagnosis not present

## 2020-07-20 IMAGING — CT CT HEAD W/O CM
3 series · 16 of 47 positions shown, 19 images · non-contrast
Comparison: CT from the previous day

CLINICAL DATA: Headaches and visual loss for a few hours, history
of recent fall with negative head CT, subsequent encounter

EXAM:
CT HEAD WITHOUT CONTRAST
TECHNIQUE: Contiguous axial images were obtained from the base of the skull
through the vertex without intravenous contrast.

[Series 2: head wo · axial · 0.43mm/px · z∈[-103,+27]mm · 10 of 32 slices shown, 13 images]
[im 3/32  brain]
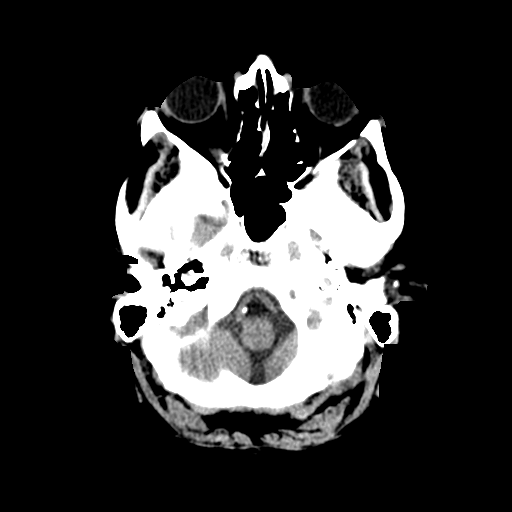
[im 3/32  bone]
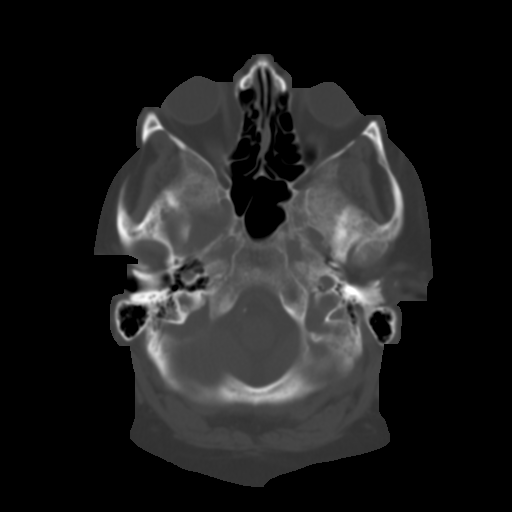
[im 6/32  brain]
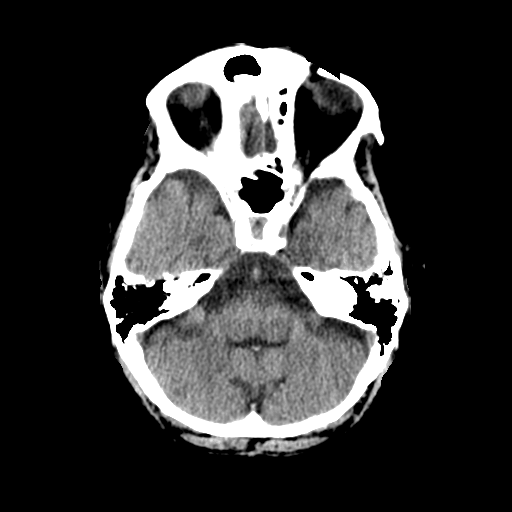
[im 9/32  brain]
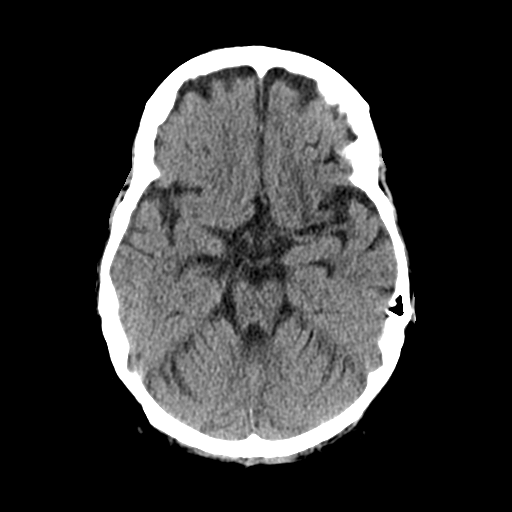
[im 11/32  brain]
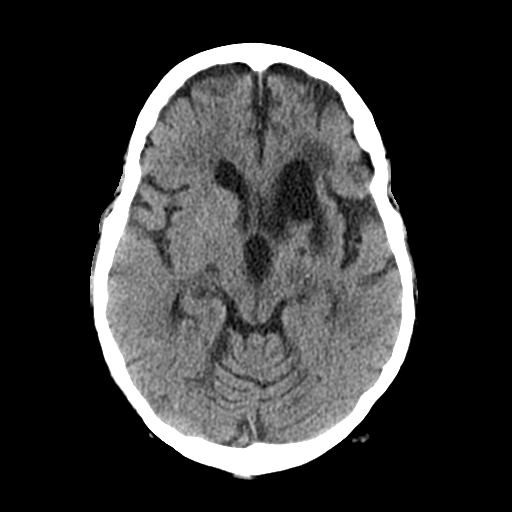
[im 14/32  brain]
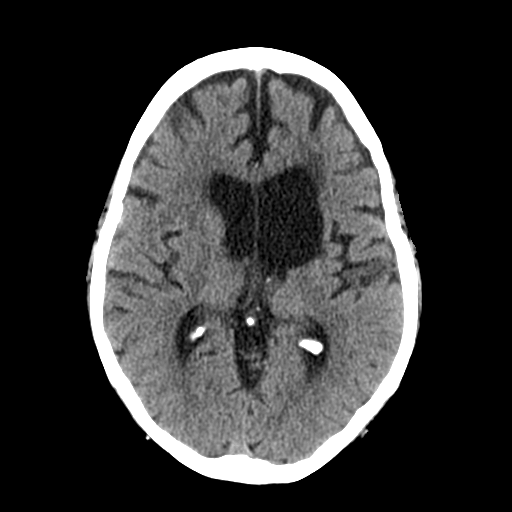
[im 14/32  bone]
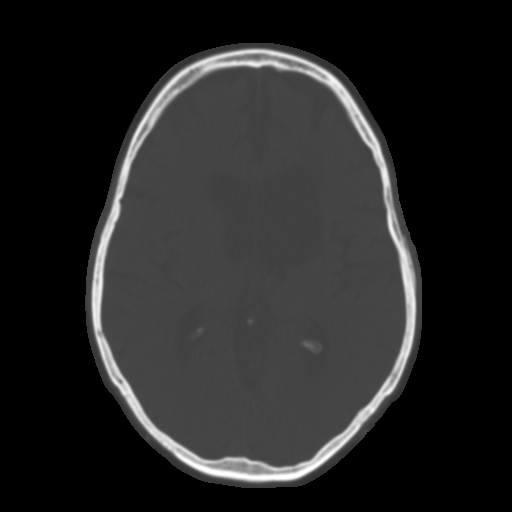
[im 18/32  brain]
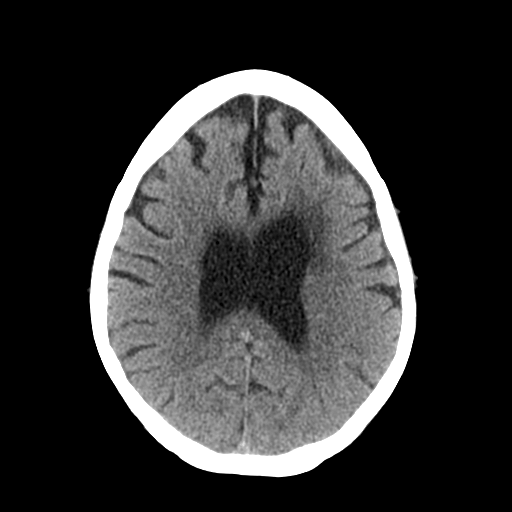
[im 21/32  brain]
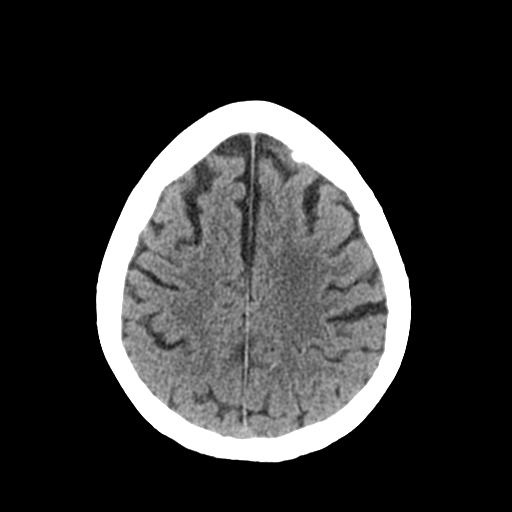
[im 24/32  brain]
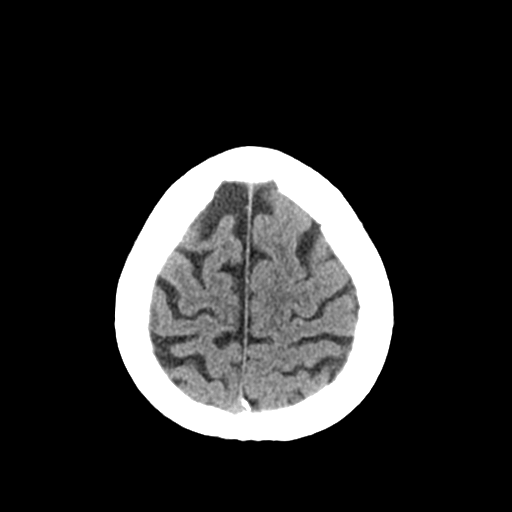
[im 26/32  brain]
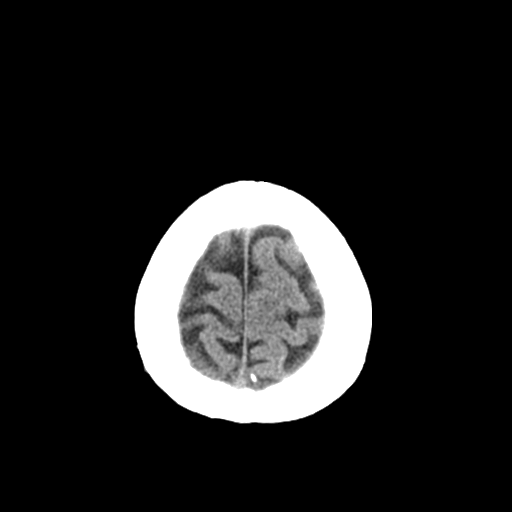
[im 26/32  bone]
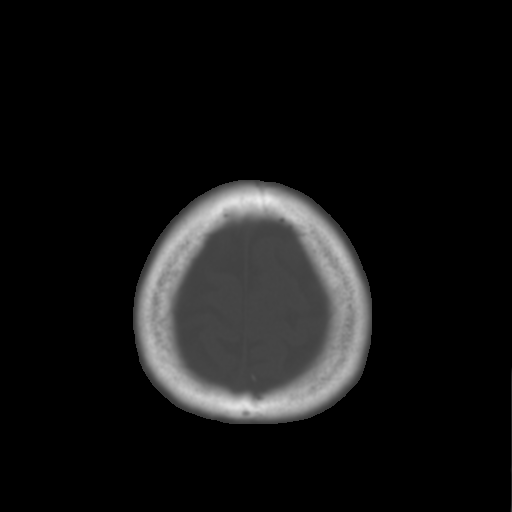
[im 29/32  brain]
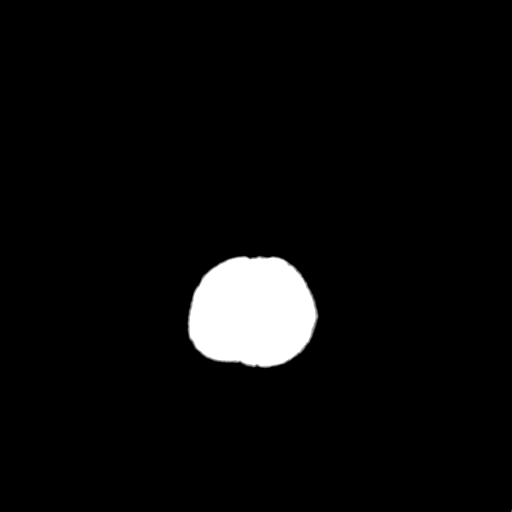

[Series 4: coronal soft tissue · coronal · 0.30mm/px · 3 of 62 slices shown]
[im 21/62  brain]
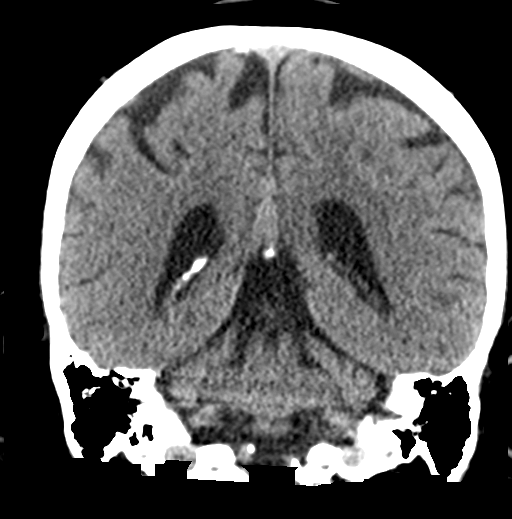
[im 28/62  brain]
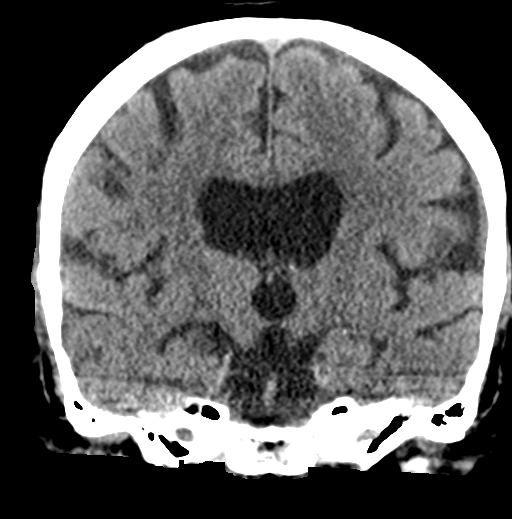
[im 34/62  brain]
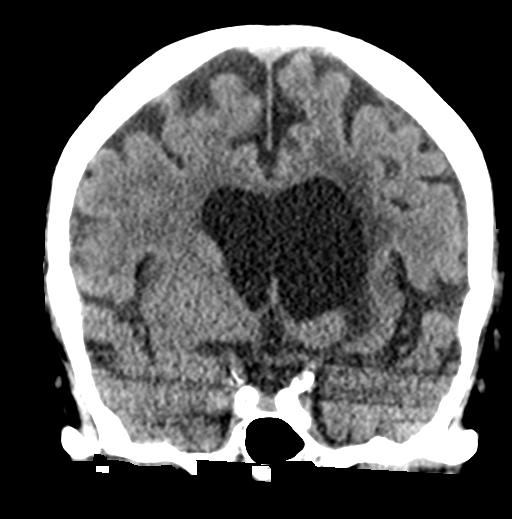

[Series 5: sagittal soft tissue · sagittal · 0.31mm/px · 3 of 47 slices shown]
[im 16/47  brain]
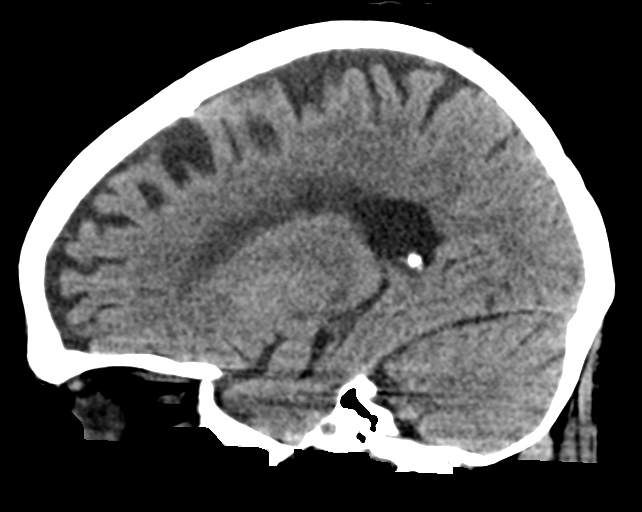
[im 24/47  brain]
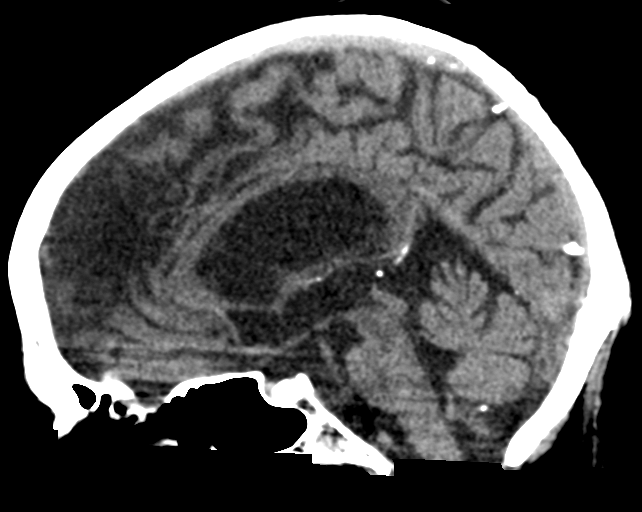
[im 31/47  brain]
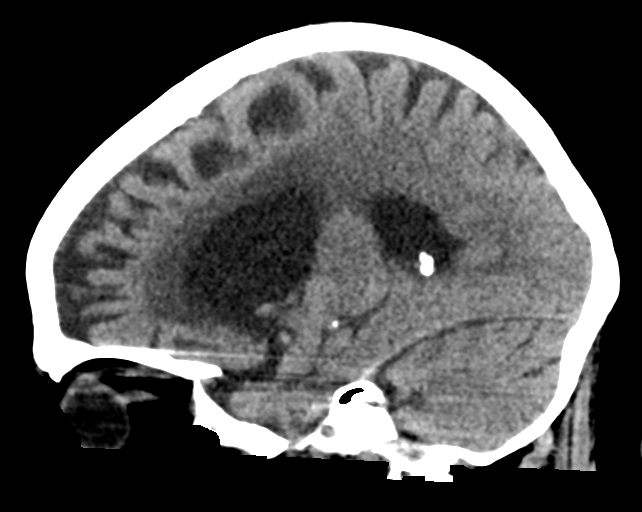

[16 of 47 positions shown; findings below may reference images not displayed]

FINDINGS: Brain: Diffuse atrophic changes are noted. Chronic white matter
ischemic change is seen. Findings of prior infarct on the left with
ex vacuo dilatation of the left lateral ventricle are again seen. No
findings to suggest acute hemorrhage, acute infarction or
space-occupying mass lesion.

Vascular: No hyperdense vessel or unexpected calcification.

Skull: Normal. Negative for fracture or focal lesion.

Sinuses/Orbits: No acute finding.

Other: None.
IMPRESSION: Chronic atrophic and ischemic changes without acute abnormality. No
significant change from the previous day.

## 2020-07-21 DIAGNOSIS — G9340 Encephalopathy, unspecified: Secondary | ICD-10-CM | POA: Diagnosis not present

## 2020-07-21 DIAGNOSIS — A4101 Sepsis due to Methicillin susceptible Staphylococcus aureus: Secondary | ICD-10-CM | POA: Diagnosis not present

## 2020-07-21 DIAGNOSIS — E1110 Type 2 diabetes mellitus with ketoacidosis without coma: Secondary | ICD-10-CM | POA: Diagnosis not present

## 2020-07-22 DIAGNOSIS — A4101 Sepsis due to Methicillin susceptible Staphylococcus aureus: Secondary | ICD-10-CM | POA: Diagnosis not present

## 2020-07-22 DIAGNOSIS — E1110 Type 2 diabetes mellitus with ketoacidosis without coma: Secondary | ICD-10-CM | POA: Diagnosis not present

## 2020-07-22 DIAGNOSIS — G9340 Encephalopathy, unspecified: Secondary | ICD-10-CM | POA: Diagnosis not present

## 2020-07-23 DIAGNOSIS — G9340 Encephalopathy, unspecified: Secondary | ICD-10-CM | POA: Diagnosis not present

## 2020-07-23 DIAGNOSIS — A4101 Sepsis due to Methicillin susceptible Staphylococcus aureus: Secondary | ICD-10-CM | POA: Diagnosis not present

## 2020-07-23 DIAGNOSIS — E1110 Type 2 diabetes mellitus with ketoacidosis without coma: Secondary | ICD-10-CM | POA: Diagnosis not present

## 2020-07-24 IMAGING — CT CT CERVICAL SPINE WITHOUT CONTRAST
3 of 4 series · 9 of 33 positions shown, 11 images · non-contrast
Comparison: CT angiogram of the neck dated 04/01/2018

CLINICAL DATA: Neck pain.

EXAM:
CT CERVICAL SPINE WITHOUT CONTRAST
TECHNIQUE: Multidetector CT imaging of the cervical spine was performed without
intravenous contrast. Multiplanar CT image reconstructions were also
generated.

[Series 6: sagittal bone · sagittal · 0.22mm/px · 5 of 51 slices shown, 6 images]
[im 17/51  bone]
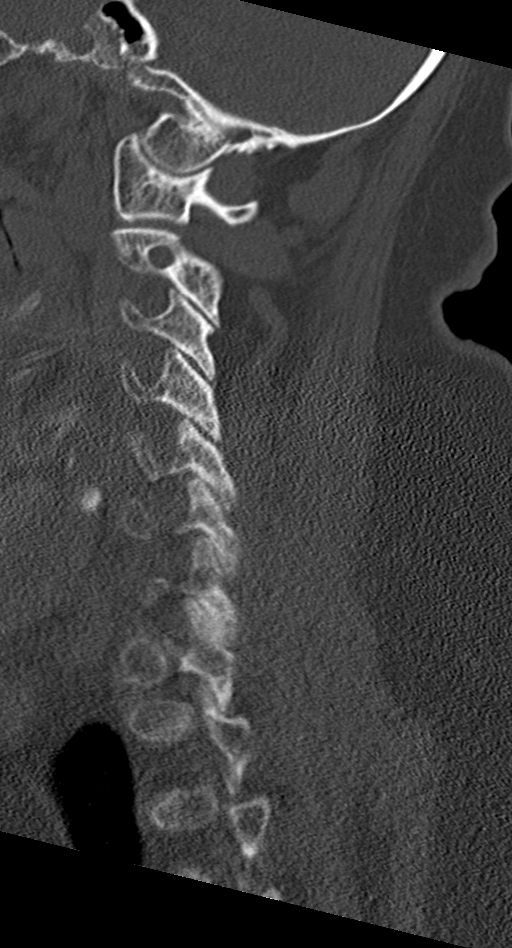
[im 21/51  bone]
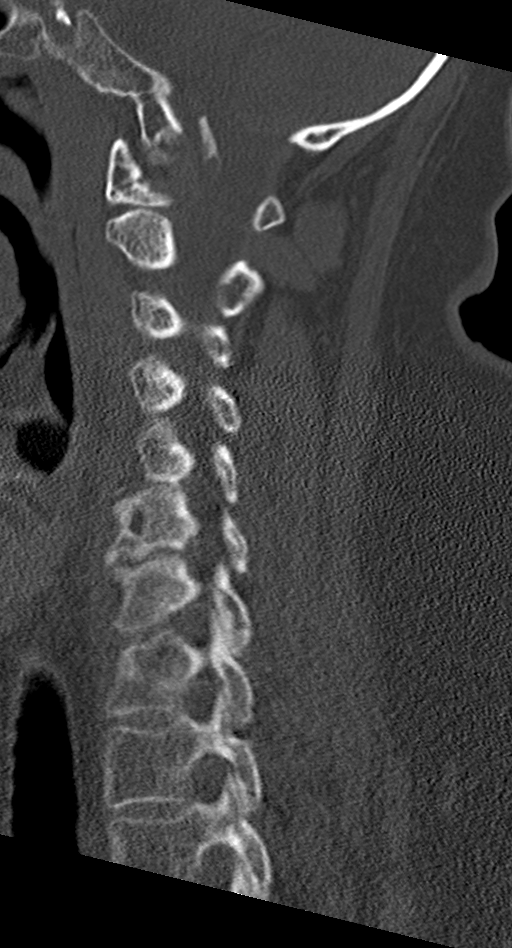
[im 26/51  soft-tissue]
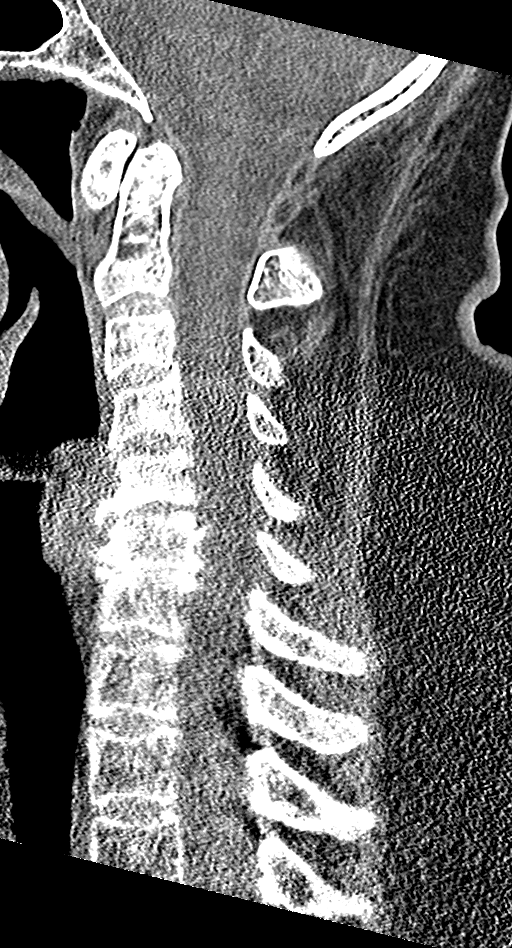
[im 26/51  bone]
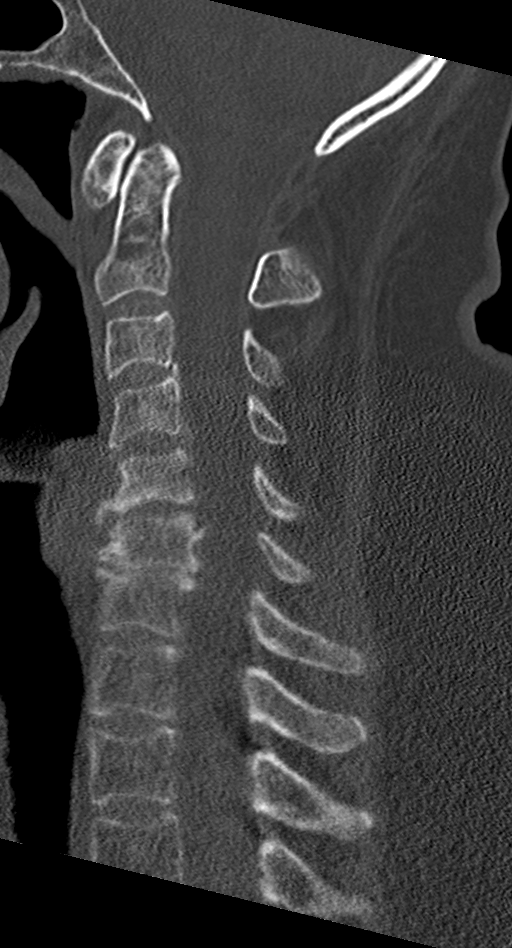
[im 30/51  bone]
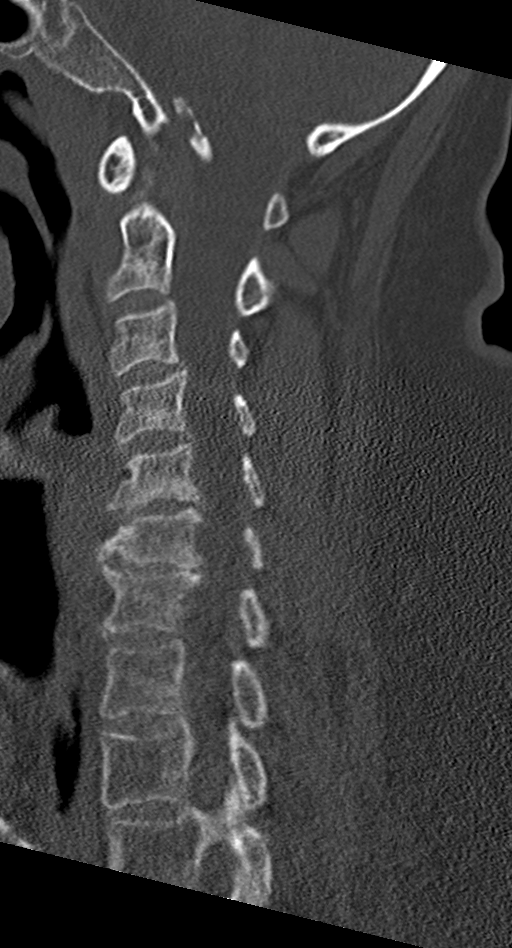
[im 34/51  bone]
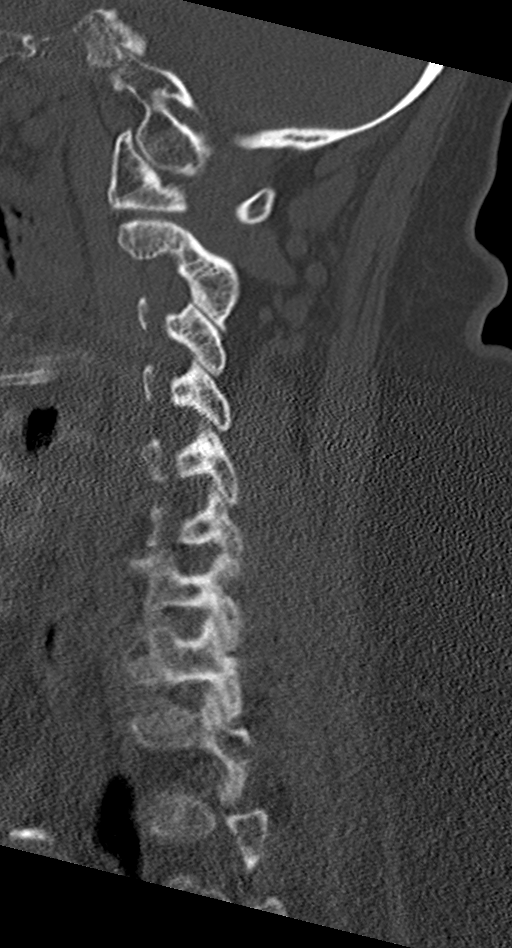

[Series 7: coronal bone · coronal · 0.27mm/px · 3 of 48 slices shown]
[im 10/48  bone]
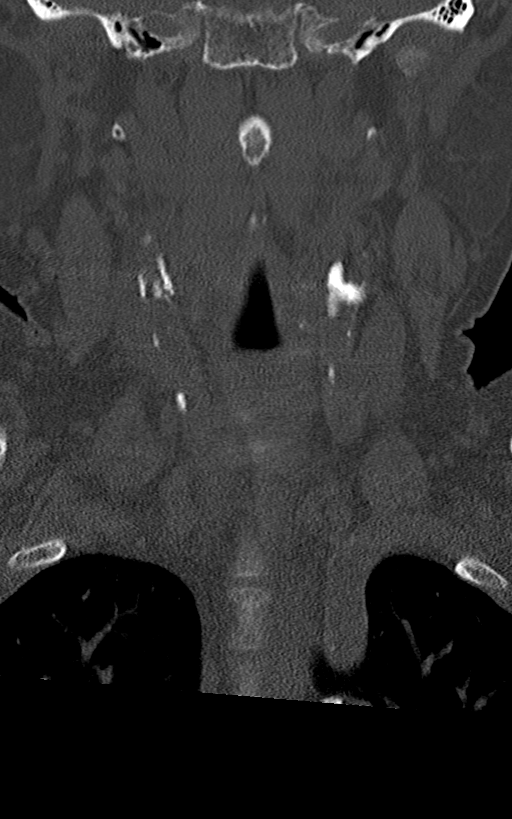
[im 19/48  bone]
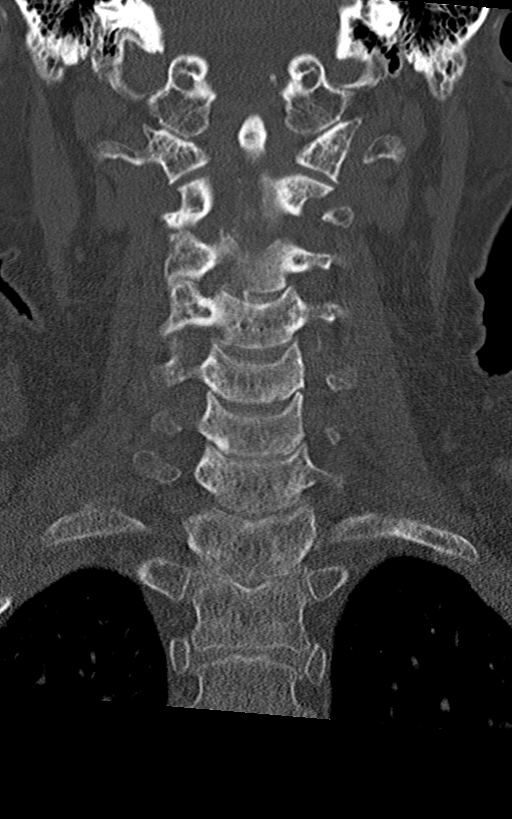
[im 29/48  bone]
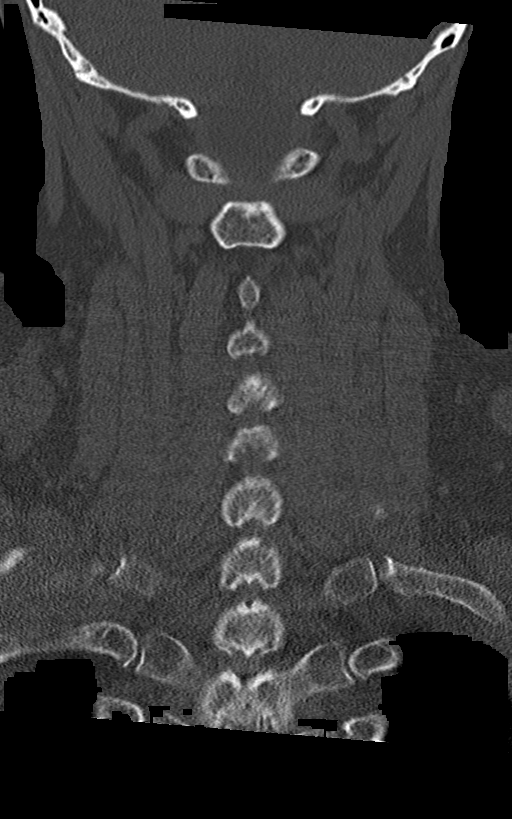

[Series 8: orthogonal bone · axial · 0.21mm/px · z∈[-32,-32]mm · 1 of 106 slices shown, 2 images]
[im 53/106  soft-tissue]
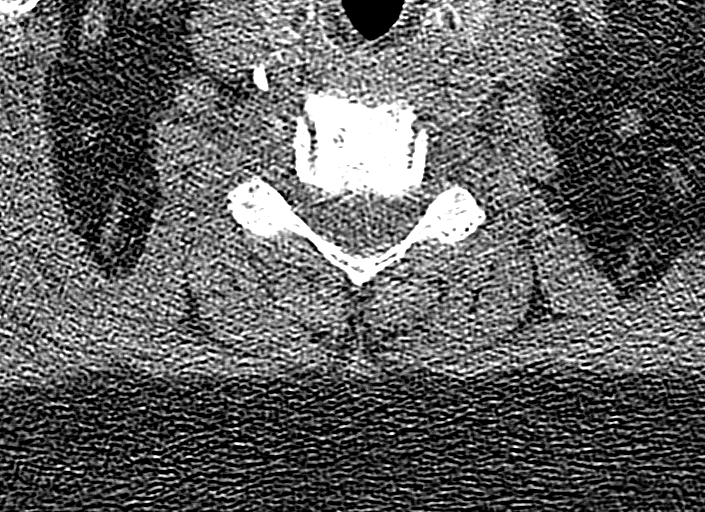
[im 53/106  bone]
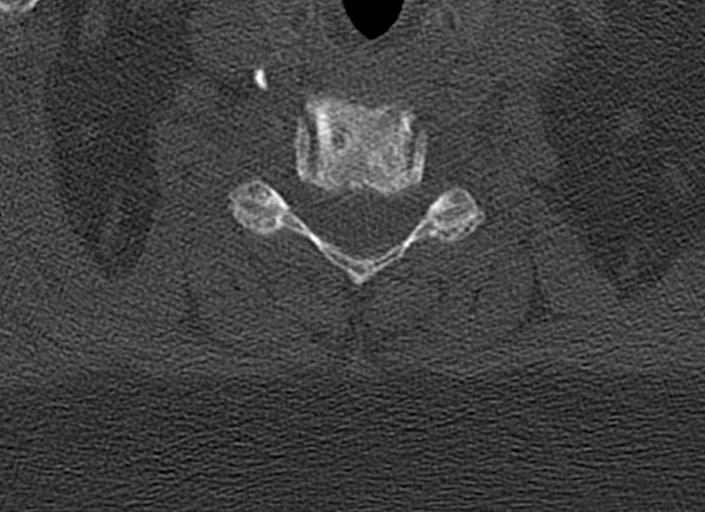

[9 of 33 positions shown; findings below may reference images not displayed]

FINDINGS: Alignment: There is straightening of the cervical lordosis. 1.5 mm
retrolisthesis of C6 on C7.

Skull base and vertebrae: No fracture or bone destruction. No
significant facet arthritis.

Soft tissues and spinal canal: No prevertebral fluid or swelling. No
visible canal hematoma.

Disc levels:  C2-3: Negative.

C3-4: Negative.

C4-5: Tiny partially calcified disc bulge in the midline with no
neural impingement.

C5-6: Minimal disc space narrowing. Small broad-based disc bulge
with no neural impingement. Widely patent neural foramina.

C6-7: Disc space narrowing. Small broad-based disc bulge with
accompanying osteophytes without neural impingement. Widely patent
neural foramina.

C7-T1: Focal calcification in the left ligamentum flavum no
consequence. No disc bulging or protrusion. No foraminal stenosis.

Upper chest: Aortic atherosclerosis.  Lung apices are normal.

Other: Prominent calcification in the carotid bifurcations
bilaterally. Calcification in the carotid siphons and in the distal
vertebral arteries.
IMPRESSION: 1. No acute abnormality of the cervical spine.
2. Degenerative disc disease primarily at C5-6 and C6-7 without
neural impingement.
3. Atherosclerotic changes in the carotid and vertebral arteries and
in the arch of the aorta.

## 2020-07-24 IMAGING — CT CT HEAD WITHOUT CONTRAST
4 series · 17 of 47 positions shown, 19 images · non-contrast
Comparison: 09/21/2018

CLINICAL DATA: Acute headache with normal neuro exam

EXAM:
CT HEAD WITHOUT CONTRAST
TECHNIQUE: Contiguous axial images were obtained from the base of the skull
through the vertex without intravenous contrast.

[Series 2: head bone · axial · 0.45mm/px · z∈[-53,+1]mm · 4 of 78 slices shown]
[im 8/78  bone]
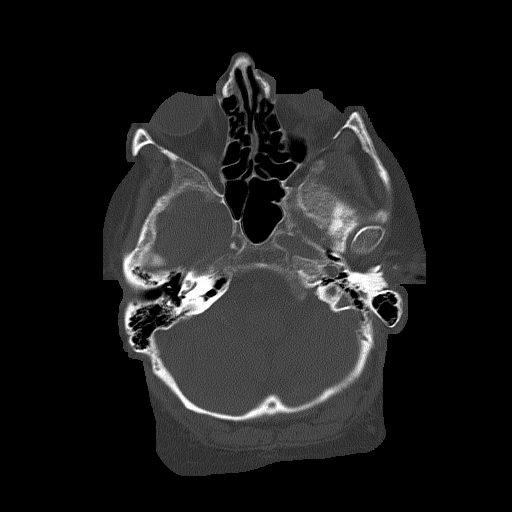
[im 16/78  bone]
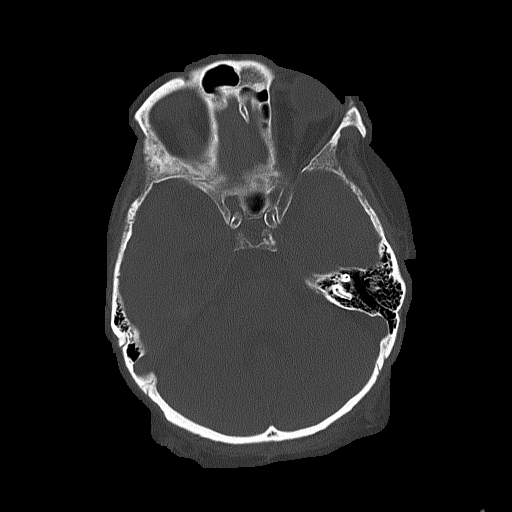
[im 24/78  bone]
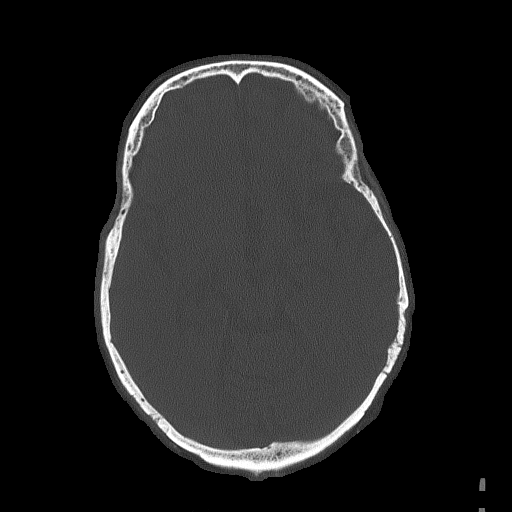
[im 35/78  bone]
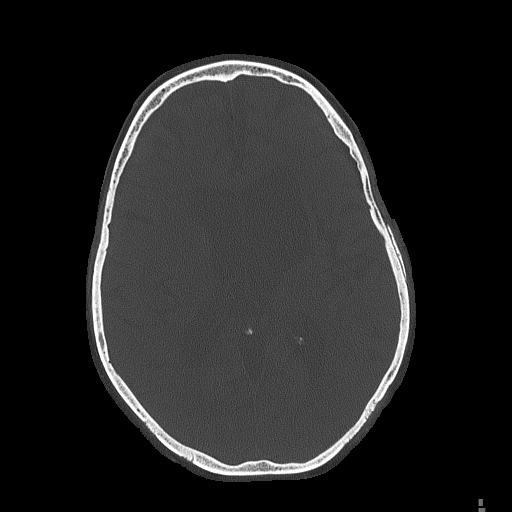

[Series 3: head wo · axial · 0.45mm/px · z∈[-52,+63]mm · 7 of 31 slices shown, 9 images]
[im 4/31  brain]
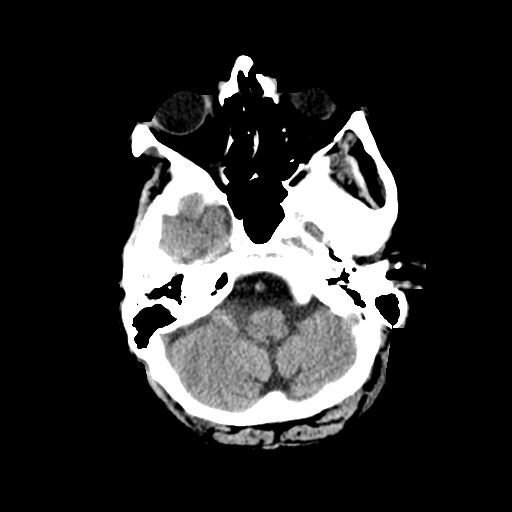
[im 4/31  bone]
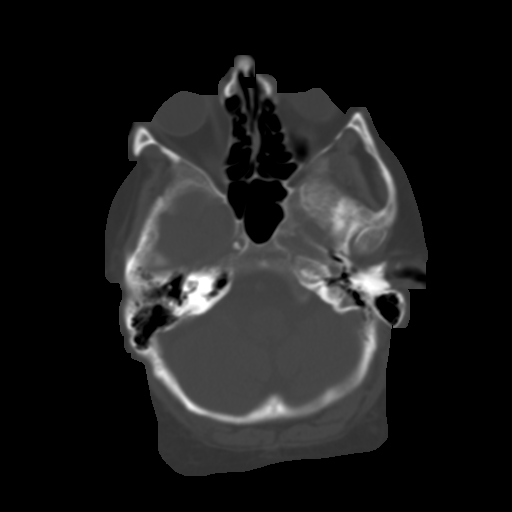
[im 8/31  brain]
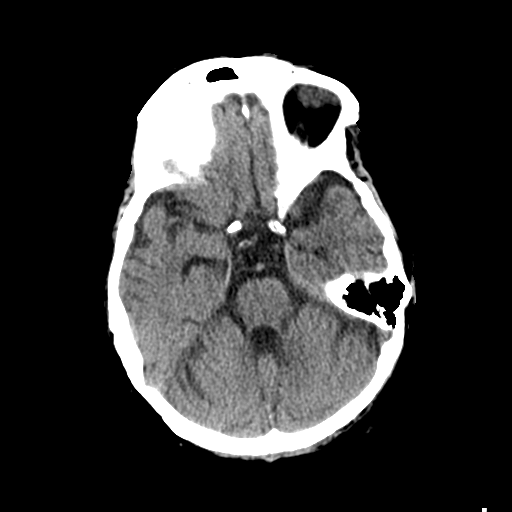
[im 12/31  brain]
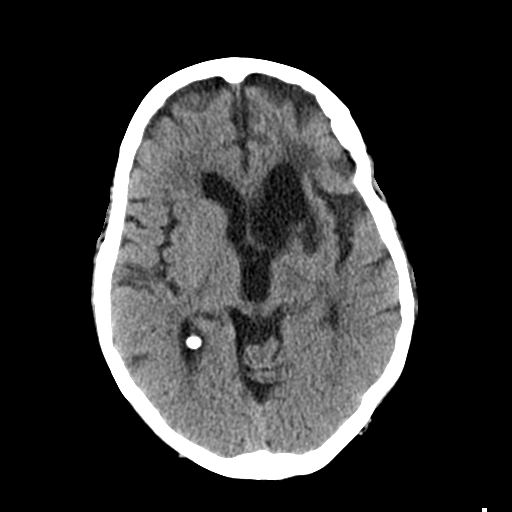
[im 16/31  brain]
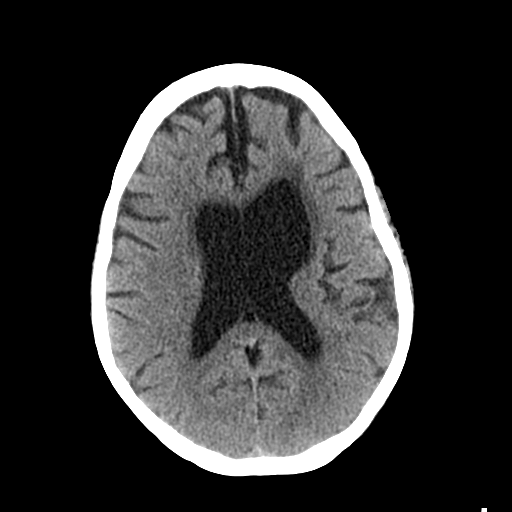
[im 19/31  brain]
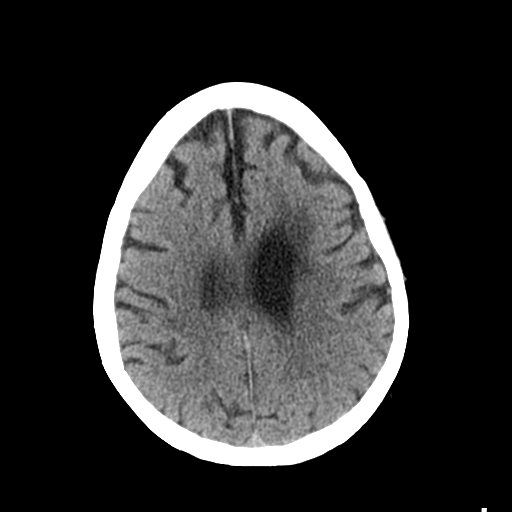
[im 19/31  bone]
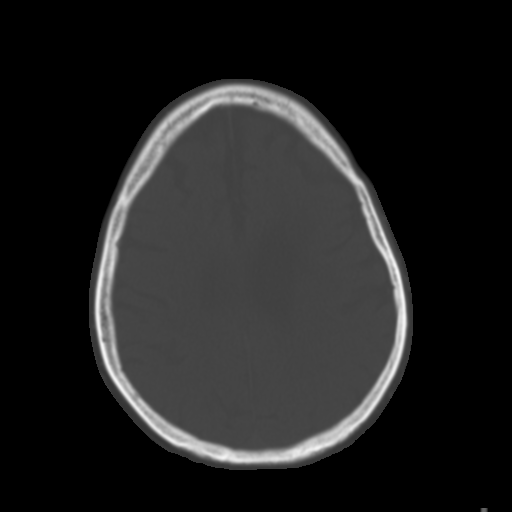
[im 23/31  brain]
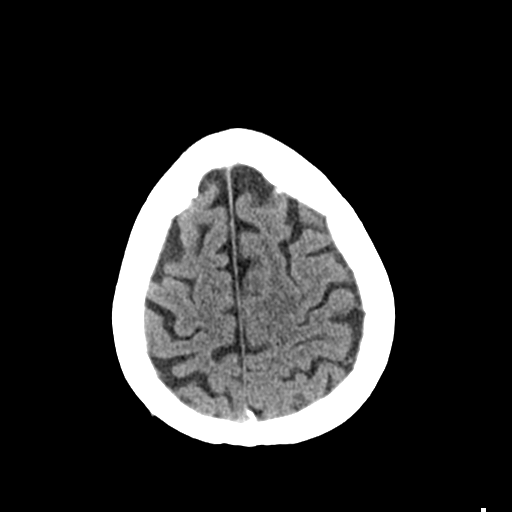
[im 27/31  brain]
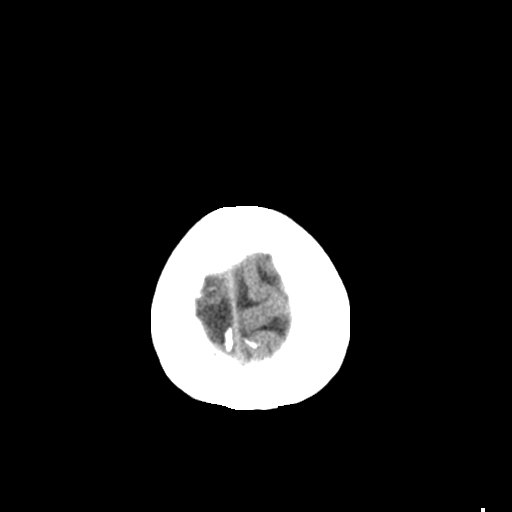

[Series 4: coronal soft tissue · coronal · 0.30mm/px · 3 of 63 slices shown]
[im 21/63  brain]
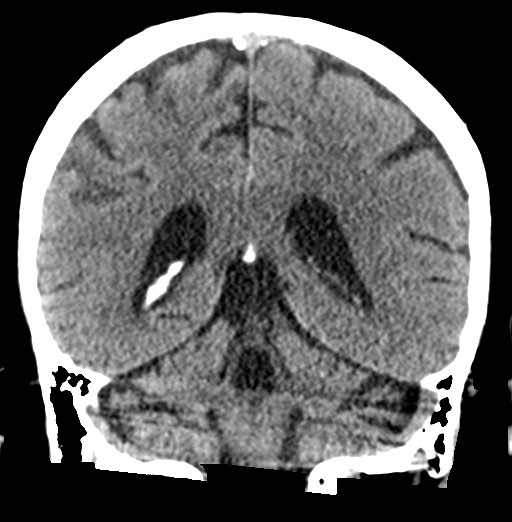
[im 28/63  brain]
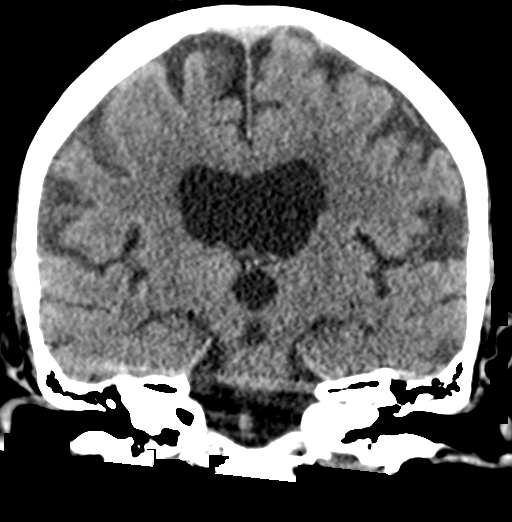
[im 35/63  brain]
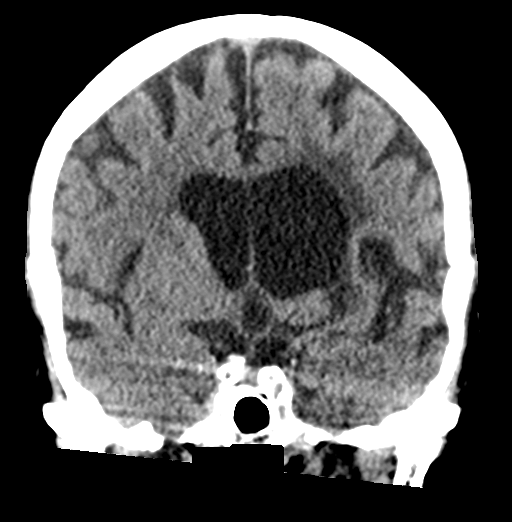

[Series 5: sagittal soft tissue · sagittal · 0.30mm/px · 3 of 48 slices shown]
[im 16/48  brain]
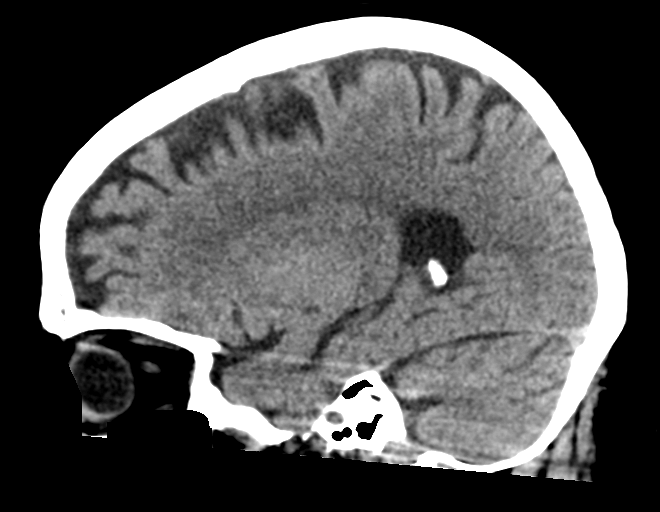
[im 24/48  brain]
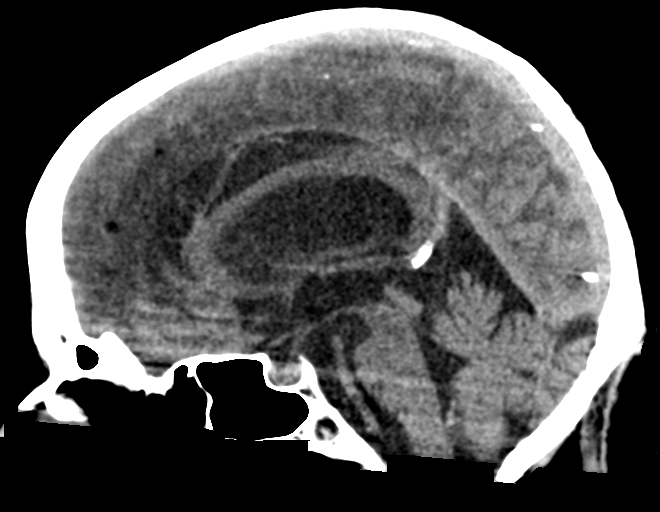
[im 32/48  brain]
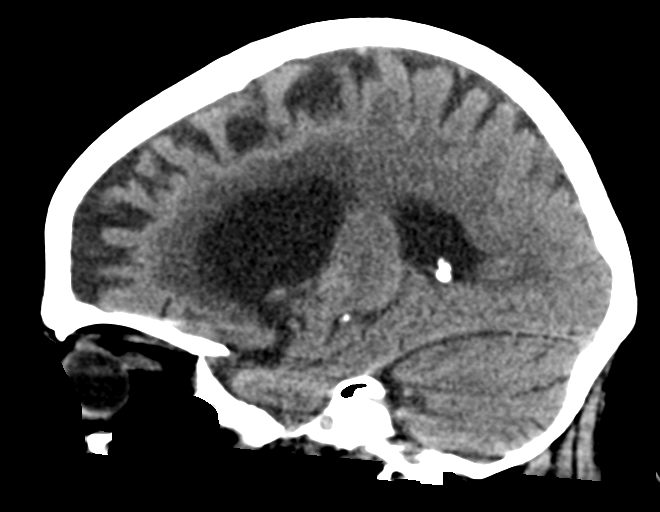

[17 of 47 positions shown; findings below may reference images not displayed]

FINDINGS: Brain: Large remote left basal ganglia infarct, lateral
lenticulostriate distribution. Cerebral volume loss with
ventriculomegaly. No acute infarct, hemorrhage, hydrocephalus,
collection, or masslike finding

Vascular: Atherosclerotic calcification without hyperdense vessel

Skull: No acute finding.

Sinuses/Orbits: Bilateral cataract resection.
IMPRESSION: 1. No acute finding or change from recent prior.
2. Remote left basal ganglia infarction.

## 2020-07-24 IMAGING — CR CHEST - 2 VIEW
1 series · 2 of 2 positions shown · non-contrast
Comparison: 06/26/2018 chest radiograph

CLINICAL DATA: 62 y/o  F; chest pain.

EXAM:
CHEST - 2 VIEW

[Series 1: dg chest 2 view · 0.14mm/px · 2 of 2 slices shown]
[im 1/2]
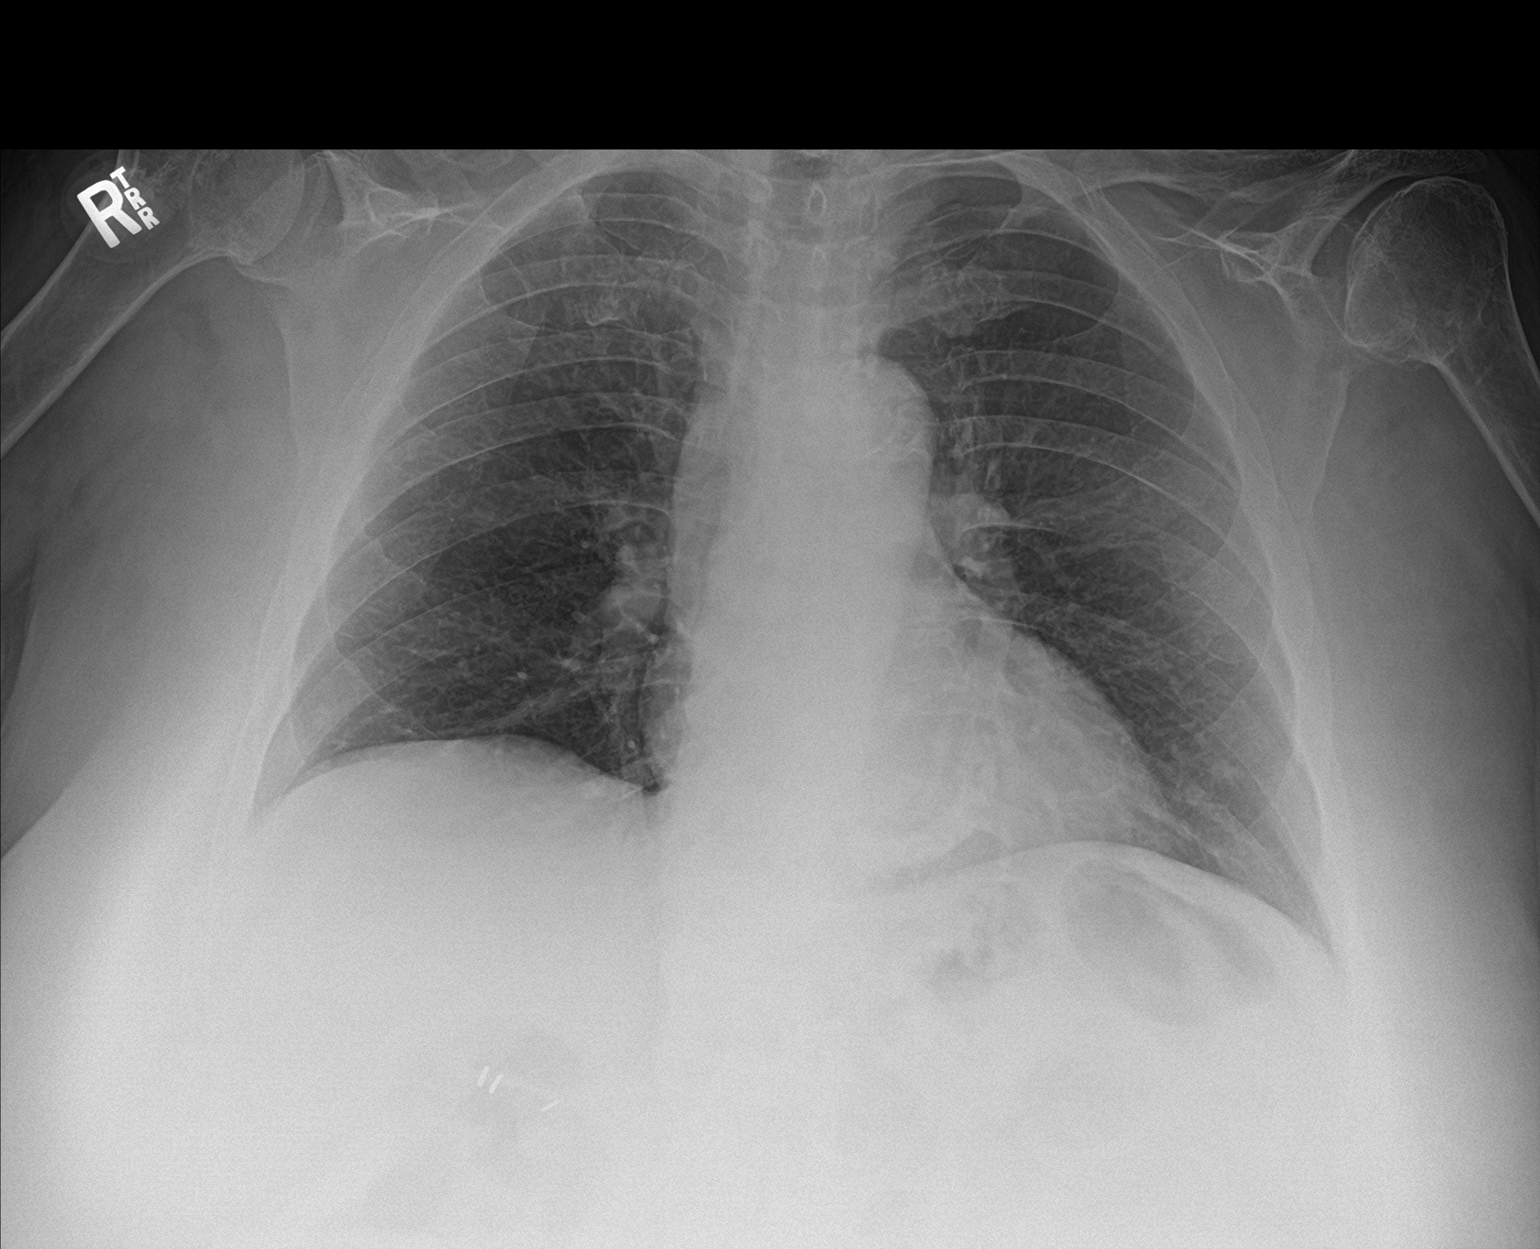
[im 2/2]
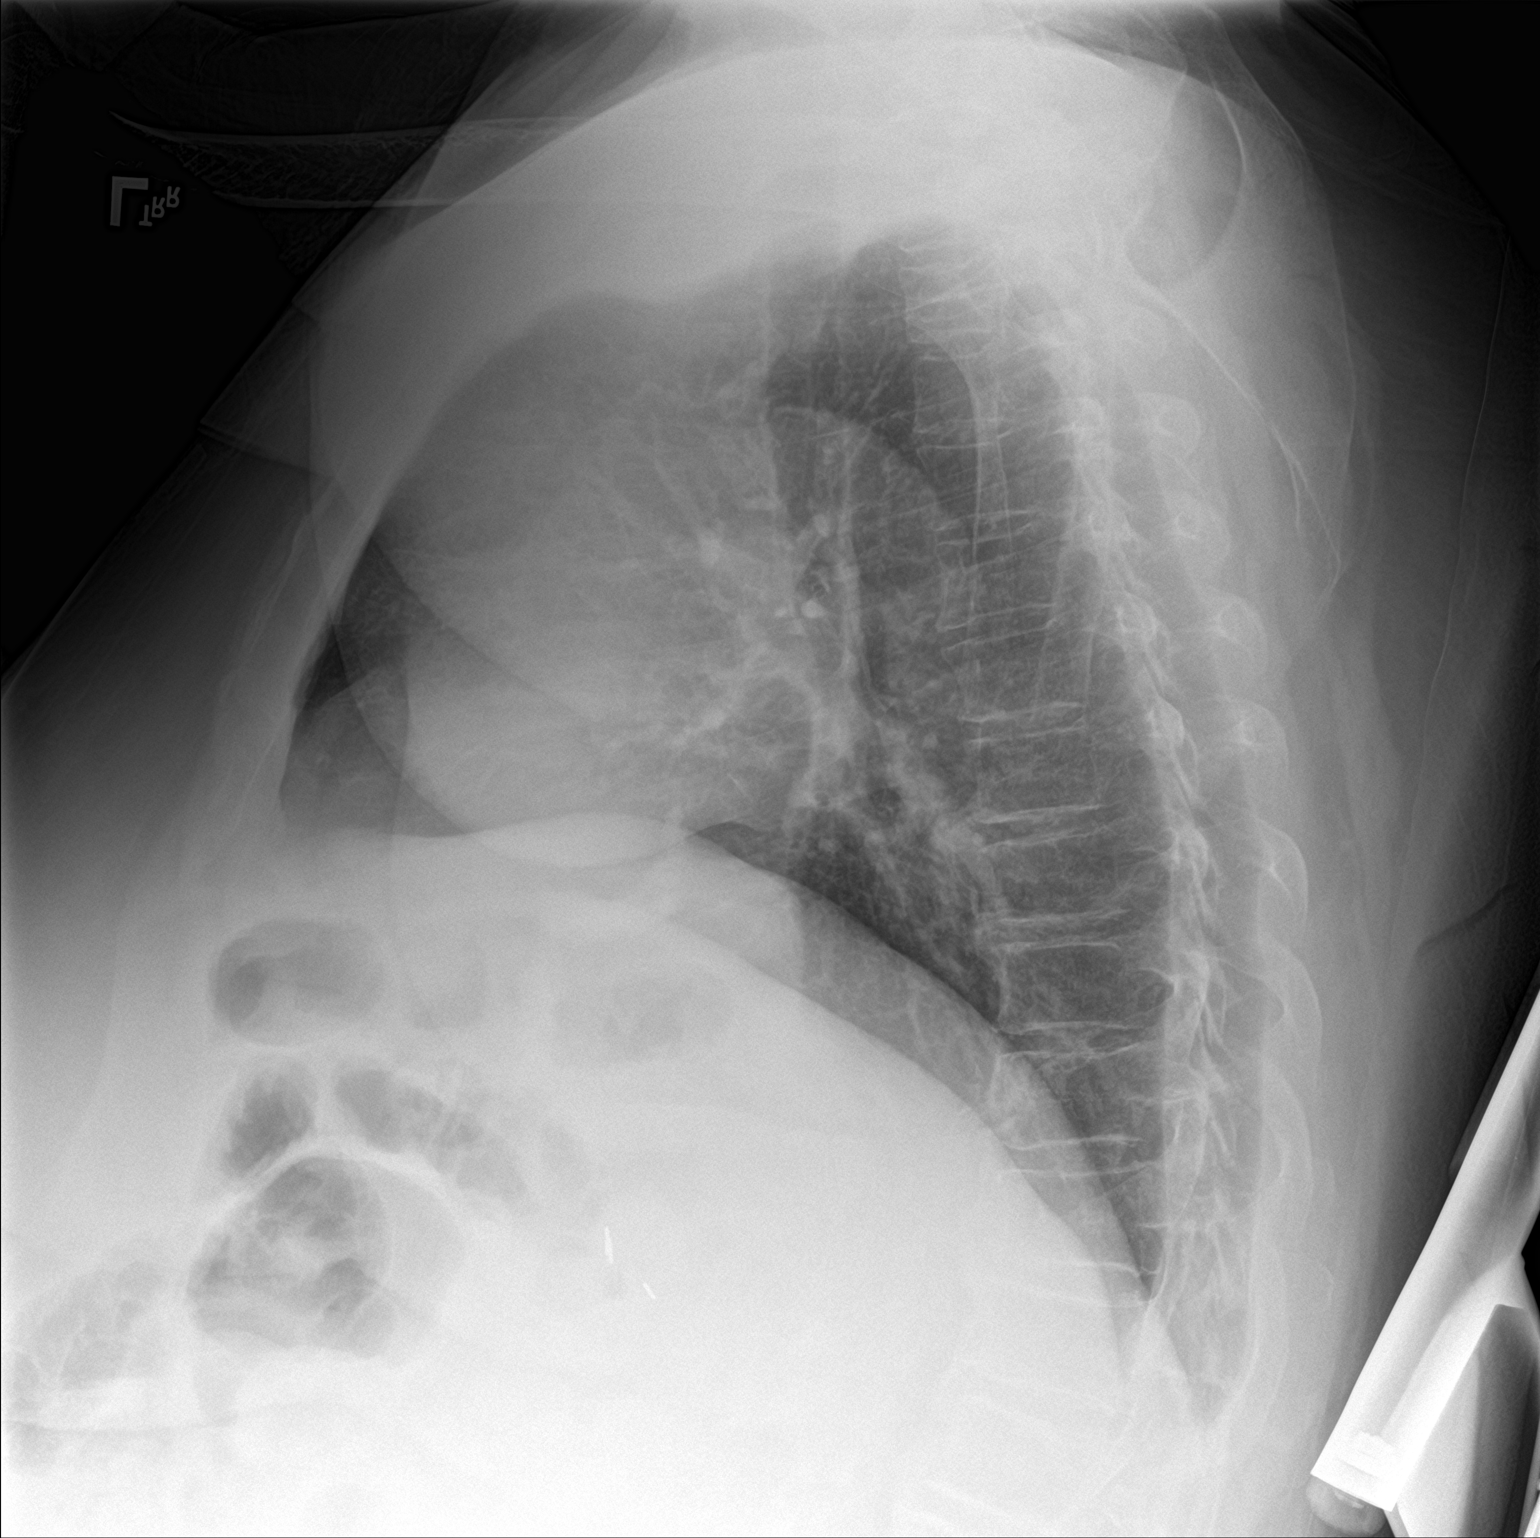

[2 of 2 positions shown; findings below may reference images not displayed]

FINDINGS: Normal cardiac silhouette. Aortic atherosclerosis with
calcification. Clear lungs. No pleural effusion or pneumothorax. No
acute osseous abnormality is evident. Right upper quadrant
cholecystectomy clips. Chronic fracture deformity of the right mid
and distal clavicle.
IMPRESSION: No acute pulmonary process identified.

## 2020-07-26 DIAGNOSIS — A4101 Sepsis due to Methicillin susceptible Staphylococcus aureus: Secondary | ICD-10-CM | POA: Diagnosis not present

## 2020-07-26 DIAGNOSIS — E1110 Type 2 diabetes mellitus with ketoacidosis without coma: Secondary | ICD-10-CM | POA: Diagnosis not present

## 2020-07-26 DIAGNOSIS — G9340 Encephalopathy, unspecified: Secondary | ICD-10-CM | POA: Diagnosis not present

## 2020-07-27 DIAGNOSIS — G9340 Encephalopathy, unspecified: Secondary | ICD-10-CM | POA: Diagnosis not present

## 2020-07-27 DIAGNOSIS — A4101 Sepsis due to Methicillin susceptible Staphylococcus aureus: Secondary | ICD-10-CM | POA: Diagnosis not present

## 2020-07-27 DIAGNOSIS — E1110 Type 2 diabetes mellitus with ketoacidosis without coma: Secondary | ICD-10-CM | POA: Diagnosis not present

## 2020-07-28 DIAGNOSIS — U071 COVID-19: Secondary | ICD-10-CM | POA: Diagnosis not present

## 2020-07-28 DIAGNOSIS — M6281 Muscle weakness (generalized): Secondary | ICD-10-CM | POA: Diagnosis not present

## 2020-07-28 DIAGNOSIS — G9340 Encephalopathy, unspecified: Secondary | ICD-10-CM | POA: Diagnosis not present

## 2020-07-28 DIAGNOSIS — A4101 Sepsis due to Methicillin susceptible Staphylococcus aureus: Secondary | ICD-10-CM | POA: Diagnosis not present

## 2020-07-28 DIAGNOSIS — E1110 Type 2 diabetes mellitus with ketoacidosis without coma: Secondary | ICD-10-CM | POA: Diagnosis not present

## 2020-07-29 ENCOUNTER — Ambulatory Visit: Payer: Medicare Other | Admitting: Neurology

## 2020-07-29 ENCOUNTER — Encounter: Payer: Self-pay | Admitting: Neurology

## 2020-07-29 DIAGNOSIS — M6281 Muscle weakness (generalized): Secondary | ICD-10-CM | POA: Diagnosis not present

## 2020-07-29 DIAGNOSIS — Z8616 Personal history of COVID-19: Secondary | ICD-10-CM | POA: Diagnosis not present

## 2020-07-29 DIAGNOSIS — G9340 Encephalopathy, unspecified: Secondary | ICD-10-CM | POA: Diagnosis not present

## 2020-07-29 DIAGNOSIS — A4101 Sepsis due to Methicillin susceptible Staphylococcus aureus: Secondary | ICD-10-CM | POA: Diagnosis not present

## 2020-07-29 DIAGNOSIS — I69898 Other sequelae of other cerebrovascular disease: Secondary | ICD-10-CM | POA: Diagnosis not present

## 2020-07-29 DIAGNOSIS — I10 Essential (primary) hypertension: Secondary | ICD-10-CM | POA: Diagnosis not present

## 2020-07-29 DIAGNOSIS — E1110 Type 2 diabetes mellitus with ketoacidosis without coma: Secondary | ICD-10-CM | POA: Diagnosis not present

## 2020-08-05 ENCOUNTER — Other Ambulatory Visit: Payer: Self-pay | Admitting: Family Medicine

## 2020-08-06 ENCOUNTER — Other Ambulatory Visit: Payer: Self-pay | Admitting: Family Medicine

## 2020-08-06 DIAGNOSIS — Z7902 Long term (current) use of antithrombotics/antiplatelets: Secondary | ICD-10-CM | POA: Diagnosis not present

## 2020-08-06 DIAGNOSIS — I252 Old myocardial infarction: Secondary | ICD-10-CM | POA: Diagnosis not present

## 2020-08-06 DIAGNOSIS — E569 Vitamin deficiency, unspecified: Secondary | ICD-10-CM | POA: Diagnosis not present

## 2020-08-06 DIAGNOSIS — N183 Chronic kidney disease, stage 3 unspecified: Secondary | ICD-10-CM

## 2020-08-06 DIAGNOSIS — Z8616 Personal history of COVID-19: Secondary | ICD-10-CM | POA: Diagnosis not present

## 2020-08-06 DIAGNOSIS — G43909 Migraine, unspecified, not intractable, without status migrainosus: Secondary | ICD-10-CM | POA: Diagnosis not present

## 2020-08-06 DIAGNOSIS — I2510 Atherosclerotic heart disease of native coronary artery without angina pectoris: Secondary | ICD-10-CM | POA: Diagnosis not present

## 2020-08-06 DIAGNOSIS — E1151 Type 2 diabetes mellitus with diabetic peripheral angiopathy without gangrene: Secondary | ICD-10-CM | POA: Diagnosis not present

## 2020-08-06 DIAGNOSIS — Z794 Long term (current) use of insulin: Secondary | ICD-10-CM | POA: Diagnosis not present

## 2020-08-06 DIAGNOSIS — I119 Hypertensive heart disease without heart failure: Secondary | ICD-10-CM | POA: Diagnosis not present

## 2020-08-06 DIAGNOSIS — Z993 Dependence on wheelchair: Secondary | ICD-10-CM | POA: Diagnosis not present

## 2020-08-06 DIAGNOSIS — E0822 Diabetes mellitus due to underlying condition with diabetic chronic kidney disease: Secondary | ICD-10-CM

## 2020-08-06 DIAGNOSIS — E785 Hyperlipidemia, unspecified: Secondary | ICD-10-CM | POA: Diagnosis not present

## 2020-08-06 DIAGNOSIS — K589 Irritable bowel syndrome without diarrhea: Secondary | ICD-10-CM | POA: Diagnosis not present

## 2020-08-06 DIAGNOSIS — I69391 Dysphagia following cerebral infarction: Secondary | ICD-10-CM | POA: Diagnosis not present

## 2020-08-06 DIAGNOSIS — M1990 Unspecified osteoarthritis, unspecified site: Secondary | ICD-10-CM | POA: Diagnosis not present

## 2020-08-06 DIAGNOSIS — Z7984 Long term (current) use of oral hypoglycemic drugs: Secondary | ICD-10-CM | POA: Diagnosis not present

## 2020-08-06 DIAGNOSIS — I69320 Aphasia following cerebral infarction: Secondary | ICD-10-CM | POA: Diagnosis not present

## 2020-08-06 DIAGNOSIS — I69318 Other symptoms and signs involving cognitive functions following cerebral infarction: Secondary | ICD-10-CM | POA: Diagnosis not present

## 2020-08-06 DIAGNOSIS — I69354 Hemiplegia and hemiparesis following cerebral infarction affecting left non-dominant side: Secondary | ICD-10-CM | POA: Diagnosis not present

## 2020-08-06 DIAGNOSIS — Z7982 Long term (current) use of aspirin: Secondary | ICD-10-CM | POA: Diagnosis not present

## 2020-08-06 DIAGNOSIS — Z9049 Acquired absence of other specified parts of digestive tract: Secondary | ICD-10-CM | POA: Diagnosis not present

## 2020-08-06 DIAGNOSIS — K219 Gastro-esophageal reflux disease without esophagitis: Secondary | ICD-10-CM | POA: Diagnosis not present

## 2020-08-06 NOTE — Telephone Encounter (Signed)
   Notes to clinic:  script was last filled on 09/17/2019  Review for refill   Requested Prescriptions  Pending Prescriptions Disp Refills   TRESIBA FLEXTOUCH 100 UNIT/ML FlexTouch Pen [Pharmacy Med Name: TRESIBA FLEXTOUCH PEN ([id number]] 9 mL 0    Sig: INJECT UP TO 30 UNITS UNDER THE SKIN DAILY      Endocrinology:  Diabetes - Insulins Failed - 08/06/2020  2:44 PM      Failed - HBA1C is between 0 and 7.9 and within 180 days    Hemoglobin A1C  Date Value Ref Range Status  01/17/2014 9.6 (H) 4.2 - 6.3 % Final    Comment:    The American Diabetes Association recommends that a primary goal of therapy should be <7% and that physicians should reevaluate the treatment regimen in patients with HbA1c values consistently >8%.    Hgb A1c MFr Bld  Date Value Ref Range Status  04/27/2020 9.6 (H) 4.8 - 5.6 % Final    Comment:    (NOTE) Pre diabetes:          5.7%-6.4%  Diabetes:              >6.4%  Glycemic control for   <7.0% adults with diabetes           Passed - Valid encounter within last 6 months    Recent Outpatient Visits           5 months ago Pressure injury of skin of sacral region, unspecified injury stage   [place], [place], PA-C   5 months ago Upper respiratory tract infection, unspecified type   [facility] Jerrol Banana., MD   5 months ago Urinary tract infection without hematuria, site unspecified   [facility] Jerrol Banana., MD   6 months ago Sinusitis, unspecified chronicity, unspecified location   [facility] Jerrol Banana., MD   8 months ago 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)   [facility] Jerrol Banana., MD

## 2020-08-07 ENCOUNTER — Other Ambulatory Visit: Payer: Self-pay | Admitting: Family Medicine

## 2020-08-07 DIAGNOSIS — E0822 Diabetes mellitus due to underlying condition with diabetic chronic kidney disease: Secondary | ICD-10-CM

## 2020-08-07 DIAGNOSIS — N183 Chronic kidney disease, stage 3 unspecified: Secondary | ICD-10-CM

## 2020-08-09 ENCOUNTER — Telehealth: Payer: Self-pay

## 2020-08-09 ENCOUNTER — Telehealth: Payer: Self-pay | Admitting: Family Medicine

## 2020-08-09 ENCOUNTER — Encounter: Payer: Self-pay | Admitting: Family Medicine

## 2020-08-09 DIAGNOSIS — Z7982 Long term (current) use of aspirin: Secondary | ICD-10-CM | POA: Diagnosis not present

## 2020-08-09 DIAGNOSIS — K589 Irritable bowel syndrome without diarrhea: Secondary | ICD-10-CM | POA: Diagnosis not present

## 2020-08-09 DIAGNOSIS — I69391 Dysphagia following cerebral infarction: Secondary | ICD-10-CM | POA: Diagnosis not present

## 2020-08-09 DIAGNOSIS — G43909 Migraine, unspecified, not intractable, without status migrainosus: Secondary | ICD-10-CM | POA: Diagnosis not present

## 2020-08-09 DIAGNOSIS — K219 Gastro-esophageal reflux disease without esophagitis: Secondary | ICD-10-CM | POA: Diagnosis not present

## 2020-08-09 DIAGNOSIS — Z794 Long term (current) use of insulin: Secondary | ICD-10-CM | POA: Diagnosis not present

## 2020-08-09 DIAGNOSIS — I69318 Other symptoms and signs involving cognitive functions following cerebral infarction: Secondary | ICD-10-CM | POA: Diagnosis not present

## 2020-08-09 DIAGNOSIS — I119 Hypertensive heart disease without heart failure: Secondary | ICD-10-CM | POA: Diagnosis not present

## 2020-08-09 DIAGNOSIS — M1990 Unspecified osteoarthritis, unspecified site: Secondary | ICD-10-CM | POA: Diagnosis not present

## 2020-08-09 DIAGNOSIS — Z8616 Personal history of COVID-19: Secondary | ICD-10-CM | POA: Diagnosis not present

## 2020-08-09 DIAGNOSIS — I2510 Atherosclerotic heart disease of native coronary artery without angina pectoris: Secondary | ICD-10-CM | POA: Diagnosis not present

## 2020-08-09 DIAGNOSIS — Z7902 Long term (current) use of antithrombotics/antiplatelets: Secondary | ICD-10-CM | POA: Diagnosis not present

## 2020-08-09 DIAGNOSIS — E1151 Type 2 diabetes mellitus with diabetic peripheral angiopathy without gangrene: Secondary | ICD-10-CM | POA: Diagnosis not present

## 2020-08-09 DIAGNOSIS — I69354 Hemiplegia and hemiparesis following cerebral infarction affecting left non-dominant side: Secondary | ICD-10-CM | POA: Diagnosis not present

## 2020-08-09 DIAGNOSIS — E785 Hyperlipidemia, unspecified: Secondary | ICD-10-CM | POA: Diagnosis not present

## 2020-08-09 DIAGNOSIS — I252 Old myocardial infarction: Secondary | ICD-10-CM | POA: Diagnosis not present

## 2020-08-09 DIAGNOSIS — Z993 Dependence on wheelchair: Secondary | ICD-10-CM | POA: Diagnosis not present

## 2020-08-09 DIAGNOSIS — I69320 Aphasia following cerebral infarction: Secondary | ICD-10-CM | POA: Diagnosis not present

## 2020-08-09 DIAGNOSIS — E569 Vitamin deficiency, unspecified: Secondary | ICD-10-CM | POA: Diagnosis not present

## 2020-08-09 DIAGNOSIS — Z9049 Acquired absence of other specified parts of digestive tract: Secondary | ICD-10-CM | POA: Diagnosis not present

## 2020-08-09 DIAGNOSIS — Z7984 Long term (current) use of oral hypoglycemic drugs: Secondary | ICD-10-CM | POA: Diagnosis not present

## 2020-08-09 NOTE — Telephone Encounter (Signed)
Copied from [place] [id number]. Topic: Quick Communication - Home Health Verbal Orders >> Aug 09, 2020  9:38 AM Yvette Rack wrote: Caller/Agency: Colletta [place] with [facility]  Callback Number: [phone] Requesting OT/PT/Skilled Nursing/Social Work/Speech Therapy: [place] Frequency: 1 time a week for 4 weeks

## 2020-08-09 NOTE — Telephone Encounter (Signed)
Copied from [place] [phone]. Topic: General - Other >> Aug 06, 2020  1:44 PM Tessa Lerner A wrote: Reason for CRM: Christy from [facility] calling to discuss medication options for patient. Patient was discharged from [facility] on 07/31/19 Susan House is seeking clarification on medications as it relates to blood sugar and prior prescriptions.  Patients blood sugar was 386 at the time of call.

## 2020-08-09 NOTE — Telephone Encounter (Signed)
Left detailed message giving okay for verbal orders.

## 2020-08-10 DIAGNOSIS — E569 Vitamin deficiency, unspecified: Secondary | ICD-10-CM | POA: Diagnosis not present

## 2020-08-10 DIAGNOSIS — Z8616 Personal history of COVID-19: Secondary | ICD-10-CM | POA: Diagnosis not present

## 2020-08-10 DIAGNOSIS — Z7902 Long term (current) use of antithrombotics/antiplatelets: Secondary | ICD-10-CM | POA: Diagnosis not present

## 2020-08-10 DIAGNOSIS — K219 Gastro-esophageal reflux disease without esophagitis: Secondary | ICD-10-CM | POA: Diagnosis not present

## 2020-08-10 DIAGNOSIS — E1151 Type 2 diabetes mellitus with diabetic peripheral angiopathy without gangrene: Secondary | ICD-10-CM | POA: Diagnosis not present

## 2020-08-10 DIAGNOSIS — Z794 Long term (current) use of insulin: Secondary | ICD-10-CM | POA: Diagnosis not present

## 2020-08-10 DIAGNOSIS — I252 Old myocardial infarction: Secondary | ICD-10-CM | POA: Diagnosis not present

## 2020-08-10 DIAGNOSIS — I69318 Other symptoms and signs involving cognitive functions following cerebral infarction: Secondary | ICD-10-CM | POA: Diagnosis not present

## 2020-08-10 DIAGNOSIS — I2510 Atherosclerotic heart disease of native coronary artery without angina pectoris: Secondary | ICD-10-CM | POA: Diagnosis not present

## 2020-08-10 DIAGNOSIS — I69391 Dysphagia following cerebral infarction: Secondary | ICD-10-CM | POA: Diagnosis not present

## 2020-08-10 DIAGNOSIS — I69320 Aphasia following cerebral infarction: Secondary | ICD-10-CM | POA: Diagnosis not present

## 2020-08-10 DIAGNOSIS — I69354 Hemiplegia and hemiparesis following cerebral infarction affecting left non-dominant side: Secondary | ICD-10-CM | POA: Diagnosis not present

## 2020-08-10 DIAGNOSIS — Z7982 Long term (current) use of aspirin: Secondary | ICD-10-CM | POA: Diagnosis not present

## 2020-08-10 DIAGNOSIS — Z7984 Long term (current) use of oral hypoglycemic drugs: Secondary | ICD-10-CM | POA: Diagnosis not present

## 2020-08-10 DIAGNOSIS — Z9049 Acquired absence of other specified parts of digestive tract: Secondary | ICD-10-CM | POA: Diagnosis not present

## 2020-08-10 DIAGNOSIS — I119 Hypertensive heart disease without heart failure: Secondary | ICD-10-CM | POA: Diagnosis not present

## 2020-08-10 DIAGNOSIS — G43909 Migraine, unspecified, not intractable, without status migrainosus: Secondary | ICD-10-CM | POA: Diagnosis not present

## 2020-08-10 DIAGNOSIS — K589 Irritable bowel syndrome without diarrhea: Secondary | ICD-10-CM | POA: Diagnosis not present

## 2020-08-10 DIAGNOSIS — Z993 Dependence on wheelchair: Secondary | ICD-10-CM | POA: Diagnosis not present

## 2020-08-10 DIAGNOSIS — M1990 Unspecified osteoarthritis, unspecified site: Secondary | ICD-10-CM | POA: Diagnosis not present

## 2020-08-10 DIAGNOSIS — E785 Hyperlipidemia, unspecified: Secondary | ICD-10-CM | POA: Diagnosis not present

## 2020-08-10 NOTE — Telephone Encounter (Signed)
LMOVM for Alyse Low to return call.

## 2020-08-12 ENCOUNTER — Telehealth: Payer: Self-pay

## 2020-08-12 DIAGNOSIS — I252 Old myocardial infarction: Secondary | ICD-10-CM | POA: Diagnosis not present

## 2020-08-12 DIAGNOSIS — Z993 Dependence on wheelchair: Secondary | ICD-10-CM | POA: Diagnosis not present

## 2020-08-12 DIAGNOSIS — Z9049 Acquired absence of other specified parts of digestive tract: Secondary | ICD-10-CM | POA: Diagnosis not present

## 2020-08-12 DIAGNOSIS — I119 Hypertensive heart disease without heart failure: Secondary | ICD-10-CM | POA: Diagnosis not present

## 2020-08-12 DIAGNOSIS — Z8616 Personal history of COVID-19: Secondary | ICD-10-CM | POA: Diagnosis not present

## 2020-08-12 DIAGNOSIS — Z794 Long term (current) use of insulin: Secondary | ICD-10-CM | POA: Diagnosis not present

## 2020-08-12 DIAGNOSIS — M1990 Unspecified osteoarthritis, unspecified site: Secondary | ICD-10-CM | POA: Diagnosis not present

## 2020-08-12 DIAGNOSIS — I69354 Hemiplegia and hemiparesis following cerebral infarction affecting left non-dominant side: Secondary | ICD-10-CM | POA: Diagnosis not present

## 2020-08-12 DIAGNOSIS — E569 Vitamin deficiency, unspecified: Secondary | ICD-10-CM | POA: Diagnosis not present

## 2020-08-12 DIAGNOSIS — K589 Irritable bowel syndrome without diarrhea: Secondary | ICD-10-CM | POA: Diagnosis not present

## 2020-08-12 DIAGNOSIS — G43909 Migraine, unspecified, not intractable, without status migrainosus: Secondary | ICD-10-CM | POA: Diagnosis not present

## 2020-08-12 DIAGNOSIS — E1151 Type 2 diabetes mellitus with diabetic peripheral angiopathy without gangrene: Secondary | ICD-10-CM | POA: Diagnosis not present

## 2020-08-12 DIAGNOSIS — Z7984 Long term (current) use of oral hypoglycemic drugs: Secondary | ICD-10-CM | POA: Diagnosis not present

## 2020-08-12 DIAGNOSIS — E785 Hyperlipidemia, unspecified: Secondary | ICD-10-CM | POA: Diagnosis not present

## 2020-08-12 DIAGNOSIS — Z7902 Long term (current) use of antithrombotics/antiplatelets: Secondary | ICD-10-CM | POA: Diagnosis not present

## 2020-08-12 DIAGNOSIS — I69320 Aphasia following cerebral infarction: Secondary | ICD-10-CM | POA: Diagnosis not present

## 2020-08-12 DIAGNOSIS — Z7982 Long term (current) use of aspirin: Secondary | ICD-10-CM | POA: Diagnosis not present

## 2020-08-12 DIAGNOSIS — K219 Gastro-esophageal reflux disease without esophagitis: Secondary | ICD-10-CM | POA: Diagnosis not present

## 2020-08-12 DIAGNOSIS — I2510 Atherosclerotic heart disease of native coronary artery without angina pectoris: Secondary | ICD-10-CM | POA: Diagnosis not present

## 2020-08-12 DIAGNOSIS — I69318 Other symptoms and signs involving cognitive functions following cerebral infarction: Secondary | ICD-10-CM | POA: Diagnosis not present

## 2020-08-12 DIAGNOSIS — I69391 Dysphagia following cerebral infarction: Secondary | ICD-10-CM | POA: Diagnosis not present

## 2020-08-12 NOTE — Telephone Encounter (Signed)
Copied from [place] [phone]. Topic: General - Other >> Aug 12, 2020 11:40 AM Tessa Lerner A wrote: Reason for CRM: Jamas Lav from [facility] has contacted PCP on behalf of patient to request a prescription/recommendation for ointment to help with itching as well a protective ointment for the patient's bottom. Patient has been itching since Tuesday 08/10/20 on both arms and has appeared to have a slight rash at time of call.

## 2020-08-12 NOTE — Telephone Encounter (Signed)
Please advise 

## 2020-08-16 ENCOUNTER — Telehealth: Payer: Self-pay | Admitting: Family Medicine

## 2020-08-16 DIAGNOSIS — E1151 Type 2 diabetes mellitus with diabetic peripheral angiopathy without gangrene: Secondary | ICD-10-CM | POA: Diagnosis not present

## 2020-08-16 DIAGNOSIS — Z993 Dependence on wheelchair: Secondary | ICD-10-CM | POA: Diagnosis not present

## 2020-08-16 DIAGNOSIS — I119 Hypertensive heart disease without heart failure: Secondary | ICD-10-CM | POA: Diagnosis not present

## 2020-08-16 DIAGNOSIS — Z7982 Long term (current) use of aspirin: Secondary | ICD-10-CM | POA: Diagnosis not present

## 2020-08-16 DIAGNOSIS — I69318 Other symptoms and signs involving cognitive functions following cerebral infarction: Secondary | ICD-10-CM | POA: Diagnosis not present

## 2020-08-16 DIAGNOSIS — K589 Irritable bowel syndrome without diarrhea: Secondary | ICD-10-CM | POA: Diagnosis not present

## 2020-08-16 DIAGNOSIS — Z7902 Long term (current) use of antithrombotics/antiplatelets: Secondary | ICD-10-CM | POA: Diagnosis not present

## 2020-08-16 DIAGNOSIS — G43909 Migraine, unspecified, not intractable, without status migrainosus: Secondary | ICD-10-CM | POA: Diagnosis not present

## 2020-08-16 DIAGNOSIS — I69391 Dysphagia following cerebral infarction: Secondary | ICD-10-CM | POA: Diagnosis not present

## 2020-08-16 DIAGNOSIS — I252 Old myocardial infarction: Secondary | ICD-10-CM | POA: Diagnosis not present

## 2020-08-16 DIAGNOSIS — I69320 Aphasia following cerebral infarction: Secondary | ICD-10-CM | POA: Diagnosis not present

## 2020-08-16 DIAGNOSIS — I2510 Atherosclerotic heart disease of native coronary artery without angina pectoris: Secondary | ICD-10-CM | POA: Diagnosis not present

## 2020-08-16 DIAGNOSIS — E785 Hyperlipidemia, unspecified: Secondary | ICD-10-CM | POA: Diagnosis not present

## 2020-08-16 DIAGNOSIS — E569 Vitamin deficiency, unspecified: Secondary | ICD-10-CM | POA: Diagnosis not present

## 2020-08-16 DIAGNOSIS — Z7984 Long term (current) use of oral hypoglycemic drugs: Secondary | ICD-10-CM | POA: Diagnosis not present

## 2020-08-16 DIAGNOSIS — M1990 Unspecified osteoarthritis, unspecified site: Secondary | ICD-10-CM | POA: Diagnosis not present

## 2020-08-16 DIAGNOSIS — K219 Gastro-esophageal reflux disease without esophagitis: Secondary | ICD-10-CM | POA: Diagnosis not present

## 2020-08-16 DIAGNOSIS — Z9049 Acquired absence of other specified parts of digestive tract: Secondary | ICD-10-CM | POA: Diagnosis not present

## 2020-08-16 DIAGNOSIS — Z8616 Personal history of COVID-19: Secondary | ICD-10-CM | POA: Diagnosis not present

## 2020-08-16 DIAGNOSIS — I69354 Hemiplegia and hemiparesis following cerebral infarction affecting left non-dominant side: Secondary | ICD-10-CM | POA: Diagnosis not present

## 2020-08-16 DIAGNOSIS — Z794 Long term (current) use of insulin: Secondary | ICD-10-CM | POA: Diagnosis not present

## 2020-08-16 NOTE — Telephone Encounter (Signed)
Home Health Verbal Orders - Caller/Agency: [place] Callback Number: [phone] Requesting OT/PT/Skilled Nursing/Social Work/Speech Therapy: Speech Therapy eval. Request  Frequency:  OT referral states that patient had difficulty swallowing,   Di Kindle wants to include swallowing eval with this

## 2020-08-16 NOTE — Telephone Encounter (Signed)
Please advise 

## 2020-08-16 NOTE — Telephone Encounter (Signed)
LMOVM for Christy to cb.

## 2020-08-16 NOTE — Telephone Encounter (Signed)
Order is complete.

## 2020-08-16 NOTE — Telephone Encounter (Signed)
Jamas Lav is calling regarding the prescription/recommenation for ointment to help with itching and a well protective ointment for the patient's bottom.  Patient also reports possible UTI sx -frequent urination, and burning. Jamas Lav states that the patient is incontent. And has history of UTI. Please advise Cb-  [phone]

## 2020-08-17 NOTE — Telephone Encounter (Signed)
[facility] is calling again to regarding patient's UTI and ointment.  Please advise.

## 2020-08-18 ENCOUNTER — Telehealth: Payer: Self-pay | Admitting: Family Medicine

## 2020-08-18 NOTE — Telephone Encounter (Signed)
There are 2 different messages attached below, concerning 2 different issues. Called [phone] and LMOVM for Jamas Lav to call back concerning UTI symptoms and ulcer.

## 2020-08-18 NOTE — Telephone Encounter (Signed)
Okay to get urinalysis and C&S sent if patient is symptomatic. Try Tegaderm for her decubitus ulcers

## 2020-08-18 NOTE — Telephone Encounter (Signed)
Home Health nurse called concerning UTI symptoms. Left message for nurse to call back for orders.

## 2020-08-18 NOTE — Telephone Encounter (Signed)
Pt called and stated that she is still having burning pain with urination / Pt stated she has called about this and hasnt heard back from anyone yet/ Please advise

## 2020-08-18 NOTE — Telephone Encounter (Signed)
Susan House was given verbal okay for orders and evaluaton.

## 2020-08-19 ENCOUNTER — Other Ambulatory Visit: Payer: Self-pay

## 2020-08-19 DIAGNOSIS — L89159 Pressure ulcer of sacral region, unspecified stage: Secondary | ICD-10-CM

## 2020-08-19 MED ORDER — CIPROFLOXACIN HCL 500 MG PO TABS: 500.0000 mg | ORAL_TABLET | Freq: Two times a day (BID) | ORAL | 0 refills | Status: DC

## 2020-08-19 MED ORDER — CAVILON DURABLE BARRIER 1.3 % EX CREA: TOPICAL_CREAM | CUTANEOUS | 0 refills | Status: AC

## 2020-08-19 NOTE — Telephone Encounter (Signed)
This has been done and orders were placed

## 2020-08-20 DIAGNOSIS — I2510 Atherosclerotic heart disease of native coronary artery without angina pectoris: Secondary | ICD-10-CM | POA: Diagnosis not present

## 2020-08-20 DIAGNOSIS — Z8616 Personal history of COVID-19: Secondary | ICD-10-CM | POA: Diagnosis not present

## 2020-08-20 DIAGNOSIS — Z9049 Acquired absence of other specified parts of digestive tract: Secondary | ICD-10-CM | POA: Diagnosis not present

## 2020-08-20 DIAGNOSIS — I69354 Hemiplegia and hemiparesis following cerebral infarction affecting left non-dominant side: Secondary | ICD-10-CM | POA: Diagnosis not present

## 2020-08-20 DIAGNOSIS — I69391 Dysphagia following cerebral infarction: Secondary | ICD-10-CM | POA: Diagnosis not present

## 2020-08-20 DIAGNOSIS — E785 Hyperlipidemia, unspecified: Secondary | ICD-10-CM | POA: Diagnosis not present

## 2020-08-20 DIAGNOSIS — K589 Irritable bowel syndrome without diarrhea: Secondary | ICD-10-CM | POA: Diagnosis not present

## 2020-08-20 DIAGNOSIS — E569 Vitamin deficiency, unspecified: Secondary | ICD-10-CM | POA: Diagnosis not present

## 2020-08-20 DIAGNOSIS — E1151 Type 2 diabetes mellitus with diabetic peripheral angiopathy without gangrene: Secondary | ICD-10-CM | POA: Diagnosis not present

## 2020-08-20 DIAGNOSIS — G43909 Migraine, unspecified, not intractable, without status migrainosus: Secondary | ICD-10-CM | POA: Diagnosis not present

## 2020-08-20 DIAGNOSIS — Z7902 Long term (current) use of antithrombotics/antiplatelets: Secondary | ICD-10-CM | POA: Diagnosis not present

## 2020-08-20 DIAGNOSIS — Z794 Long term (current) use of insulin: Secondary | ICD-10-CM | POA: Diagnosis not present

## 2020-08-20 DIAGNOSIS — M1990 Unspecified osteoarthritis, unspecified site: Secondary | ICD-10-CM | POA: Diagnosis not present

## 2020-08-20 DIAGNOSIS — Z993 Dependence on wheelchair: Secondary | ICD-10-CM | POA: Diagnosis not present

## 2020-08-20 DIAGNOSIS — K219 Gastro-esophageal reflux disease without esophagitis: Secondary | ICD-10-CM | POA: Diagnosis not present

## 2020-08-20 DIAGNOSIS — I69320 Aphasia following cerebral infarction: Secondary | ICD-10-CM | POA: Diagnosis not present

## 2020-08-20 DIAGNOSIS — I69318 Other symptoms and signs involving cognitive functions following cerebral infarction: Secondary | ICD-10-CM | POA: Diagnosis not present

## 2020-08-20 DIAGNOSIS — I252 Old myocardial infarction: Secondary | ICD-10-CM | POA: Diagnosis not present

## 2020-08-20 DIAGNOSIS — Z7984 Long term (current) use of oral hypoglycemic drugs: Secondary | ICD-10-CM | POA: Diagnosis not present

## 2020-08-20 DIAGNOSIS — Z7982 Long term (current) use of aspirin: Secondary | ICD-10-CM | POA: Diagnosis not present

## 2020-08-20 DIAGNOSIS — I119 Hypertensive heart disease without heart failure: Secondary | ICD-10-CM | POA: Diagnosis not present

## 2020-08-20 NOTE — Telephone Encounter (Signed)
Complete

## 2020-08-23 DIAGNOSIS — N171 Acute kidney failure with acute cortical necrosis: Secondary | ICD-10-CM | POA: Diagnosis not present

## 2020-08-23 DIAGNOSIS — Z993 Dependence on wheelchair: Secondary | ICD-10-CM | POA: Diagnosis not present

## 2020-08-23 DIAGNOSIS — K589 Irritable bowel syndrome without diarrhea: Secondary | ICD-10-CM | POA: Diagnosis not present

## 2020-08-23 DIAGNOSIS — Z7902 Long term (current) use of antithrombotics/antiplatelets: Secondary | ICD-10-CM | POA: Diagnosis not present

## 2020-08-23 DIAGNOSIS — I69320 Aphasia following cerebral infarction: Secondary | ICD-10-CM | POA: Diagnosis not present

## 2020-08-23 DIAGNOSIS — I2510 Atherosclerotic heart disease of native coronary artery without angina pectoris: Secondary | ICD-10-CM | POA: Diagnosis not present

## 2020-08-23 DIAGNOSIS — E785 Hyperlipidemia, unspecified: Secondary | ICD-10-CM | POA: Diagnosis not present

## 2020-08-23 DIAGNOSIS — G459 Transient cerebral ischemic attack, unspecified: Secondary | ICD-10-CM | POA: Diagnosis not present

## 2020-08-23 DIAGNOSIS — Z7984 Long term (current) use of oral hypoglycemic drugs: Secondary | ICD-10-CM | POA: Diagnosis not present

## 2020-08-23 DIAGNOSIS — M1990 Unspecified osteoarthritis, unspecified site: Secondary | ICD-10-CM | POA: Diagnosis not present

## 2020-08-23 DIAGNOSIS — Z9049 Acquired absence of other specified parts of digestive tract: Secondary | ICD-10-CM | POA: Diagnosis not present

## 2020-08-23 DIAGNOSIS — E1151 Type 2 diabetes mellitus with diabetic peripheral angiopathy without gangrene: Secondary | ICD-10-CM | POA: Diagnosis not present

## 2020-08-23 DIAGNOSIS — N183 Chronic kidney disease, stage 3 unspecified: Secondary | ICD-10-CM | POA: Diagnosis not present

## 2020-08-23 DIAGNOSIS — Z8616 Personal history of COVID-19: Secondary | ICD-10-CM | POA: Diagnosis not present

## 2020-08-23 DIAGNOSIS — I69391 Dysphagia following cerebral infarction: Secondary | ICD-10-CM | POA: Diagnosis not present

## 2020-08-23 DIAGNOSIS — G43909 Migraine, unspecified, not intractable, without status migrainosus: Secondary | ICD-10-CM | POA: Diagnosis not present

## 2020-08-23 DIAGNOSIS — I69318 Other symptoms and signs involving cognitive functions following cerebral infarction: Secondary | ICD-10-CM | POA: Diagnosis not present

## 2020-08-23 DIAGNOSIS — I252 Old myocardial infarction: Secondary | ICD-10-CM | POA: Diagnosis not present

## 2020-08-23 DIAGNOSIS — K219 Gastro-esophageal reflux disease without esophagitis: Secondary | ICD-10-CM | POA: Diagnosis not present

## 2020-08-23 DIAGNOSIS — I69354 Hemiplegia and hemiparesis following cerebral infarction affecting left non-dominant side: Secondary | ICD-10-CM | POA: Diagnosis not present

## 2020-08-23 DIAGNOSIS — I119 Hypertensive heart disease without heart failure: Secondary | ICD-10-CM | POA: Diagnosis not present

## 2020-08-23 DIAGNOSIS — E569 Vitamin deficiency, unspecified: Secondary | ICD-10-CM | POA: Diagnosis not present

## 2020-08-23 DIAGNOSIS — Z7982 Long term (current) use of aspirin: Secondary | ICD-10-CM | POA: Diagnosis not present

## 2020-08-23 DIAGNOSIS — R531 Weakness: Secondary | ICD-10-CM | POA: Diagnosis not present

## 2020-08-23 DIAGNOSIS — Z794 Long term (current) use of insulin: Secondary | ICD-10-CM | POA: Diagnosis not present

## 2020-08-24 DIAGNOSIS — I2510 Atherosclerotic heart disease of native coronary artery without angina pectoris: Secondary | ICD-10-CM | POA: Diagnosis not present

## 2020-08-24 DIAGNOSIS — Z7902 Long term (current) use of antithrombotics/antiplatelets: Secondary | ICD-10-CM | POA: Diagnosis not present

## 2020-08-24 DIAGNOSIS — Z993 Dependence on wheelchair: Secondary | ICD-10-CM | POA: Diagnosis not present

## 2020-08-24 DIAGNOSIS — I69318 Other symptoms and signs involving cognitive functions following cerebral infarction: Secondary | ICD-10-CM | POA: Diagnosis not present

## 2020-08-24 DIAGNOSIS — Z9049 Acquired absence of other specified parts of digestive tract: Secondary | ICD-10-CM | POA: Diagnosis not present

## 2020-08-24 DIAGNOSIS — I69391 Dysphagia following cerebral infarction: Secondary | ICD-10-CM | POA: Diagnosis not present

## 2020-08-24 DIAGNOSIS — E785 Hyperlipidemia, unspecified: Secondary | ICD-10-CM | POA: Diagnosis not present

## 2020-08-24 DIAGNOSIS — E1151 Type 2 diabetes mellitus with diabetic peripheral angiopathy without gangrene: Secondary | ICD-10-CM | POA: Diagnosis not present

## 2020-08-24 DIAGNOSIS — I252 Old myocardial infarction: Secondary | ICD-10-CM | POA: Diagnosis not present

## 2020-08-24 DIAGNOSIS — Z8616 Personal history of COVID-19: Secondary | ICD-10-CM | POA: Diagnosis not present

## 2020-08-24 DIAGNOSIS — K219 Gastro-esophageal reflux disease without esophagitis: Secondary | ICD-10-CM | POA: Diagnosis not present

## 2020-08-24 DIAGNOSIS — M1990 Unspecified osteoarthritis, unspecified site: Secondary | ICD-10-CM | POA: Diagnosis not present

## 2020-08-24 DIAGNOSIS — Z7984 Long term (current) use of oral hypoglycemic drugs: Secondary | ICD-10-CM | POA: Diagnosis not present

## 2020-08-24 DIAGNOSIS — I69320 Aphasia following cerebral infarction: Secondary | ICD-10-CM | POA: Diagnosis not present

## 2020-08-24 DIAGNOSIS — Z7982 Long term (current) use of aspirin: Secondary | ICD-10-CM | POA: Diagnosis not present

## 2020-08-24 DIAGNOSIS — E569 Vitamin deficiency, unspecified: Secondary | ICD-10-CM | POA: Diagnosis not present

## 2020-08-24 DIAGNOSIS — I119 Hypertensive heart disease without heart failure: Secondary | ICD-10-CM | POA: Diagnosis not present

## 2020-08-24 DIAGNOSIS — G43909 Migraine, unspecified, not intractable, without status migrainosus: Secondary | ICD-10-CM | POA: Diagnosis not present

## 2020-08-24 DIAGNOSIS — K589 Irritable bowel syndrome without diarrhea: Secondary | ICD-10-CM | POA: Diagnosis not present

## 2020-08-24 DIAGNOSIS — Z794 Long term (current) use of insulin: Secondary | ICD-10-CM | POA: Diagnosis not present

## 2020-08-24 DIAGNOSIS — I69354 Hemiplegia and hemiparesis following cerebral infarction affecting left non-dominant side: Secondary | ICD-10-CM | POA: Diagnosis not present

## 2020-08-26 DIAGNOSIS — K589 Irritable bowel syndrome without diarrhea: Secondary | ICD-10-CM | POA: Diagnosis not present

## 2020-08-26 DIAGNOSIS — I252 Old myocardial infarction: Secondary | ICD-10-CM | POA: Diagnosis not present

## 2020-08-26 DIAGNOSIS — Z8616 Personal history of COVID-19: Secondary | ICD-10-CM | POA: Diagnosis not present

## 2020-08-26 DIAGNOSIS — Z9049 Acquired absence of other specified parts of digestive tract: Secondary | ICD-10-CM | POA: Diagnosis not present

## 2020-08-26 DIAGNOSIS — I69318 Other symptoms and signs involving cognitive functions following cerebral infarction: Secondary | ICD-10-CM | POA: Diagnosis not present

## 2020-08-26 DIAGNOSIS — E785 Hyperlipidemia, unspecified: Secondary | ICD-10-CM | POA: Diagnosis not present

## 2020-08-26 DIAGNOSIS — E569 Vitamin deficiency, unspecified: Secondary | ICD-10-CM | POA: Diagnosis not present

## 2020-08-26 DIAGNOSIS — E1151 Type 2 diabetes mellitus with diabetic peripheral angiopathy without gangrene: Secondary | ICD-10-CM | POA: Diagnosis not present

## 2020-08-26 DIAGNOSIS — I69320 Aphasia following cerebral infarction: Secondary | ICD-10-CM | POA: Diagnosis not present

## 2020-08-26 DIAGNOSIS — I69391 Dysphagia following cerebral infarction: Secondary | ICD-10-CM | POA: Diagnosis not present

## 2020-08-26 DIAGNOSIS — Z7982 Long term (current) use of aspirin: Secondary | ICD-10-CM | POA: Diagnosis not present

## 2020-08-26 DIAGNOSIS — I119 Hypertensive heart disease without heart failure: Secondary | ICD-10-CM | POA: Diagnosis not present

## 2020-08-26 DIAGNOSIS — Z7902 Long term (current) use of antithrombotics/antiplatelets: Secondary | ICD-10-CM | POA: Diagnosis not present

## 2020-08-26 DIAGNOSIS — Z993 Dependence on wheelchair: Secondary | ICD-10-CM | POA: Diagnosis not present

## 2020-08-26 DIAGNOSIS — Z7984 Long term (current) use of oral hypoglycemic drugs: Secondary | ICD-10-CM | POA: Diagnosis not present

## 2020-08-26 DIAGNOSIS — K219 Gastro-esophageal reflux disease without esophagitis: Secondary | ICD-10-CM | POA: Diagnosis not present

## 2020-08-26 DIAGNOSIS — M1990 Unspecified osteoarthritis, unspecified site: Secondary | ICD-10-CM | POA: Diagnosis not present

## 2020-08-26 DIAGNOSIS — I69354 Hemiplegia and hemiparesis following cerebral infarction affecting left non-dominant side: Secondary | ICD-10-CM | POA: Diagnosis not present

## 2020-08-26 DIAGNOSIS — Z794 Long term (current) use of insulin: Secondary | ICD-10-CM | POA: Diagnosis not present

## 2020-08-26 DIAGNOSIS — G43909 Migraine, unspecified, not intractable, without status migrainosus: Secondary | ICD-10-CM | POA: Diagnosis not present

## 2020-08-26 DIAGNOSIS — I2510 Atherosclerotic heart disease of native coronary artery without angina pectoris: Secondary | ICD-10-CM | POA: Diagnosis not present

## 2020-08-28 DIAGNOSIS — M6281 Muscle weakness (generalized): Secondary | ICD-10-CM | POA: Diagnosis not present

## 2020-08-28 DIAGNOSIS — U071 COVID-19: Secondary | ICD-10-CM | POA: Diagnosis not present

## 2020-08-30 ENCOUNTER — Telehealth: Payer: Self-pay | Admitting: Family Medicine

## 2020-08-30 DIAGNOSIS — I69318 Other symptoms and signs involving cognitive functions following cerebral infarction: Secondary | ICD-10-CM | POA: Diagnosis not present

## 2020-08-30 DIAGNOSIS — Z7982 Long term (current) use of aspirin: Secondary | ICD-10-CM | POA: Diagnosis not present

## 2020-08-30 DIAGNOSIS — I69320 Aphasia following cerebral infarction: Secondary | ICD-10-CM | POA: Diagnosis not present

## 2020-08-30 DIAGNOSIS — Z7902 Long term (current) use of antithrombotics/antiplatelets: Secondary | ICD-10-CM | POA: Diagnosis not present

## 2020-08-30 DIAGNOSIS — M1990 Unspecified osteoarthritis, unspecified site: Secondary | ICD-10-CM | POA: Diagnosis not present

## 2020-08-30 DIAGNOSIS — K219 Gastro-esophageal reflux disease without esophagitis: Secondary | ICD-10-CM | POA: Diagnosis not present

## 2020-08-30 DIAGNOSIS — K589 Irritable bowel syndrome without diarrhea: Secondary | ICD-10-CM | POA: Diagnosis not present

## 2020-08-30 DIAGNOSIS — I69354 Hemiplegia and hemiparesis following cerebral infarction affecting left non-dominant side: Secondary | ICD-10-CM | POA: Diagnosis not present

## 2020-08-30 DIAGNOSIS — Z8616 Personal history of COVID-19: Secondary | ICD-10-CM | POA: Diagnosis not present

## 2020-08-30 DIAGNOSIS — E785 Hyperlipidemia, unspecified: Secondary | ICD-10-CM | POA: Diagnosis not present

## 2020-08-30 DIAGNOSIS — I119 Hypertensive heart disease without heart failure: Secondary | ICD-10-CM | POA: Diagnosis not present

## 2020-08-30 DIAGNOSIS — E569 Vitamin deficiency, unspecified: Secondary | ICD-10-CM | POA: Diagnosis not present

## 2020-08-30 DIAGNOSIS — E1151 Type 2 diabetes mellitus with diabetic peripheral angiopathy without gangrene: Secondary | ICD-10-CM | POA: Diagnosis not present

## 2020-08-30 DIAGNOSIS — Z9049 Acquired absence of other specified parts of digestive tract: Secondary | ICD-10-CM | POA: Diagnosis not present

## 2020-08-30 DIAGNOSIS — I252 Old myocardial infarction: Secondary | ICD-10-CM | POA: Diagnosis not present

## 2020-08-30 DIAGNOSIS — I2510 Atherosclerotic heart disease of native coronary artery without angina pectoris: Secondary | ICD-10-CM | POA: Diagnosis not present

## 2020-08-30 DIAGNOSIS — G43909 Migraine, unspecified, not intractable, without status migrainosus: Secondary | ICD-10-CM | POA: Diagnosis not present

## 2020-08-30 DIAGNOSIS — Z794 Long term (current) use of insulin: Secondary | ICD-10-CM | POA: Diagnosis not present

## 2020-08-30 DIAGNOSIS — Z993 Dependence on wheelchair: Secondary | ICD-10-CM | POA: Diagnosis not present

## 2020-08-30 DIAGNOSIS — Z7984 Long term (current) use of oral hypoglycemic drugs: Secondary | ICD-10-CM | POA: Diagnosis not present

## 2020-08-30 DIAGNOSIS — I69391 Dysphagia following cerebral infarction: Secondary | ICD-10-CM | POA: Diagnosis not present

## 2020-08-30 NOTE — Telephone Encounter (Signed)
Home Health Verbal Orders - Caller/Agency: [place] Number: [phone] / secure vm can be left  Requesting OT to be continued   Frequency: 1x a week for 4 weeks

## 2020-08-30 NOTE — Telephone Encounter (Signed)
Ok for orders? 

## 2020-08-31 DIAGNOSIS — E569 Vitamin deficiency, unspecified: Secondary | ICD-10-CM | POA: Diagnosis not present

## 2020-08-31 DIAGNOSIS — Z993 Dependence on wheelchair: Secondary | ICD-10-CM | POA: Diagnosis not present

## 2020-08-31 DIAGNOSIS — Z7902 Long term (current) use of antithrombotics/antiplatelets: Secondary | ICD-10-CM | POA: Diagnosis not present

## 2020-08-31 DIAGNOSIS — Z7982 Long term (current) use of aspirin: Secondary | ICD-10-CM | POA: Diagnosis not present

## 2020-08-31 DIAGNOSIS — I69354 Hemiplegia and hemiparesis following cerebral infarction affecting left non-dominant side: Secondary | ICD-10-CM | POA: Diagnosis not present

## 2020-08-31 DIAGNOSIS — K589 Irritable bowel syndrome without diarrhea: Secondary | ICD-10-CM | POA: Diagnosis not present

## 2020-08-31 DIAGNOSIS — I119 Hypertensive heart disease without heart failure: Secondary | ICD-10-CM | POA: Diagnosis not present

## 2020-08-31 DIAGNOSIS — I69318 Other symptoms and signs involving cognitive functions following cerebral infarction: Secondary | ICD-10-CM | POA: Diagnosis not present

## 2020-08-31 DIAGNOSIS — I2510 Atherosclerotic heart disease of native coronary artery without angina pectoris: Secondary | ICD-10-CM | POA: Diagnosis not present

## 2020-08-31 DIAGNOSIS — K219 Gastro-esophageal reflux disease without esophagitis: Secondary | ICD-10-CM | POA: Diagnosis not present

## 2020-08-31 DIAGNOSIS — I69391 Dysphagia following cerebral infarction: Secondary | ICD-10-CM | POA: Diagnosis not present

## 2020-08-31 DIAGNOSIS — E785 Hyperlipidemia, unspecified: Secondary | ICD-10-CM | POA: Diagnosis not present

## 2020-08-31 DIAGNOSIS — M1990 Unspecified osteoarthritis, unspecified site: Secondary | ICD-10-CM | POA: Diagnosis not present

## 2020-08-31 DIAGNOSIS — I252 Old myocardial infarction: Secondary | ICD-10-CM | POA: Diagnosis not present

## 2020-08-31 DIAGNOSIS — G43909 Migraine, unspecified, not intractable, without status migrainosus: Secondary | ICD-10-CM | POA: Diagnosis not present

## 2020-08-31 DIAGNOSIS — Z7984 Long term (current) use of oral hypoglycemic drugs: Secondary | ICD-10-CM | POA: Diagnosis not present

## 2020-08-31 DIAGNOSIS — Z794 Long term (current) use of insulin: Secondary | ICD-10-CM | POA: Diagnosis not present

## 2020-08-31 DIAGNOSIS — Z9049 Acquired absence of other specified parts of digestive tract: Secondary | ICD-10-CM | POA: Diagnosis not present

## 2020-08-31 DIAGNOSIS — I69320 Aphasia following cerebral infarction: Secondary | ICD-10-CM | POA: Diagnosis not present

## 2020-08-31 DIAGNOSIS — E1151 Type 2 diabetes mellitus with diabetic peripheral angiopathy without gangrene: Secondary | ICD-10-CM | POA: Diagnosis not present

## 2020-08-31 DIAGNOSIS — Z8616 Personal history of COVID-19: Secondary | ICD-10-CM | POA: Diagnosis not present

## 2020-09-02 ENCOUNTER — Encounter: Payer: Self-pay | Admitting: Family Medicine

## 2020-09-02 NOTE — Telephone Encounter (Signed)
Left detailed message given verbal okay.

## 2020-09-02 NOTE — Telephone Encounter (Signed)
ok 

## 2020-09-03 DIAGNOSIS — Z7902 Long term (current) use of antithrombotics/antiplatelets: Secondary | ICD-10-CM | POA: Diagnosis not present

## 2020-09-03 DIAGNOSIS — G43909 Migraine, unspecified, not intractable, without status migrainosus: Secondary | ICD-10-CM | POA: Diagnosis not present

## 2020-09-03 DIAGNOSIS — Z9049 Acquired absence of other specified parts of digestive tract: Secondary | ICD-10-CM | POA: Diagnosis not present

## 2020-09-03 DIAGNOSIS — E569 Vitamin deficiency, unspecified: Secondary | ICD-10-CM | POA: Diagnosis not present

## 2020-09-03 DIAGNOSIS — M1990 Unspecified osteoarthritis, unspecified site: Secondary | ICD-10-CM | POA: Diagnosis not present

## 2020-09-03 DIAGNOSIS — Z7982 Long term (current) use of aspirin: Secondary | ICD-10-CM | POA: Diagnosis not present

## 2020-09-03 DIAGNOSIS — I69391 Dysphagia following cerebral infarction: Secondary | ICD-10-CM | POA: Diagnosis not present

## 2020-09-03 DIAGNOSIS — Z8616 Personal history of COVID-19: Secondary | ICD-10-CM | POA: Diagnosis not present

## 2020-09-03 DIAGNOSIS — I119 Hypertensive heart disease without heart failure: Secondary | ICD-10-CM | POA: Diagnosis not present

## 2020-09-03 DIAGNOSIS — I2510 Atherosclerotic heart disease of native coronary artery without angina pectoris: Secondary | ICD-10-CM | POA: Diagnosis not present

## 2020-09-03 DIAGNOSIS — Z993 Dependence on wheelchair: Secondary | ICD-10-CM | POA: Diagnosis not present

## 2020-09-03 DIAGNOSIS — K219 Gastro-esophageal reflux disease without esophagitis: Secondary | ICD-10-CM | POA: Diagnosis not present

## 2020-09-03 DIAGNOSIS — Z7984 Long term (current) use of oral hypoglycemic drugs: Secondary | ICD-10-CM | POA: Diagnosis not present

## 2020-09-03 DIAGNOSIS — I69354 Hemiplegia and hemiparesis following cerebral infarction affecting left non-dominant side: Secondary | ICD-10-CM | POA: Diagnosis not present

## 2020-09-03 DIAGNOSIS — I69320 Aphasia following cerebral infarction: Secondary | ICD-10-CM | POA: Diagnosis not present

## 2020-09-03 DIAGNOSIS — I252 Old myocardial infarction: Secondary | ICD-10-CM | POA: Diagnosis not present

## 2020-09-03 DIAGNOSIS — E785 Hyperlipidemia, unspecified: Secondary | ICD-10-CM | POA: Diagnosis not present

## 2020-09-03 DIAGNOSIS — I69318 Other symptoms and signs involving cognitive functions following cerebral infarction: Secondary | ICD-10-CM | POA: Diagnosis not present

## 2020-09-03 DIAGNOSIS — E1151 Type 2 diabetes mellitus with diabetic peripheral angiopathy without gangrene: Secondary | ICD-10-CM | POA: Diagnosis not present

## 2020-09-03 DIAGNOSIS — K589 Irritable bowel syndrome without diarrhea: Secondary | ICD-10-CM | POA: Diagnosis not present

## 2020-09-03 DIAGNOSIS — Z794 Long term (current) use of insulin: Secondary | ICD-10-CM | POA: Diagnosis not present

## 2020-09-06 DIAGNOSIS — E1151 Type 2 diabetes mellitus with diabetic peripheral angiopathy without gangrene: Secondary | ICD-10-CM | POA: Diagnosis not present

## 2020-09-06 DIAGNOSIS — Z7984 Long term (current) use of oral hypoglycemic drugs: Secondary | ICD-10-CM | POA: Diagnosis not present

## 2020-09-06 DIAGNOSIS — M1990 Unspecified osteoarthritis, unspecified site: Secondary | ICD-10-CM | POA: Diagnosis not present

## 2020-09-06 DIAGNOSIS — I69354 Hemiplegia and hemiparesis following cerebral infarction affecting left non-dominant side: Secondary | ICD-10-CM | POA: Diagnosis not present

## 2020-09-06 DIAGNOSIS — Z8616 Personal history of COVID-19: Secondary | ICD-10-CM | POA: Diagnosis not present

## 2020-09-06 DIAGNOSIS — K589 Irritable bowel syndrome without diarrhea: Secondary | ICD-10-CM | POA: Diagnosis not present

## 2020-09-06 DIAGNOSIS — G43909 Migraine, unspecified, not intractable, without status migrainosus: Secondary | ICD-10-CM | POA: Diagnosis not present

## 2020-09-06 DIAGNOSIS — I69320 Aphasia following cerebral infarction: Secondary | ICD-10-CM | POA: Diagnosis not present

## 2020-09-06 DIAGNOSIS — E569 Vitamin deficiency, unspecified: Secondary | ICD-10-CM | POA: Diagnosis not present

## 2020-09-06 DIAGNOSIS — Z7982 Long term (current) use of aspirin: Secondary | ICD-10-CM | POA: Diagnosis not present

## 2020-09-06 DIAGNOSIS — E785 Hyperlipidemia, unspecified: Secondary | ICD-10-CM | POA: Diagnosis not present

## 2020-09-06 DIAGNOSIS — I119 Hypertensive heart disease without heart failure: Secondary | ICD-10-CM | POA: Diagnosis not present

## 2020-09-06 DIAGNOSIS — Z993 Dependence on wheelchair: Secondary | ICD-10-CM | POA: Diagnosis not present

## 2020-09-06 DIAGNOSIS — I252 Old myocardial infarction: Secondary | ICD-10-CM | POA: Diagnosis not present

## 2020-09-06 DIAGNOSIS — Z7902 Long term (current) use of antithrombotics/antiplatelets: Secondary | ICD-10-CM | POA: Diagnosis not present

## 2020-09-06 DIAGNOSIS — I2510 Atherosclerotic heart disease of native coronary artery without angina pectoris: Secondary | ICD-10-CM | POA: Diagnosis not present

## 2020-09-06 DIAGNOSIS — K219 Gastro-esophageal reflux disease without esophagitis: Secondary | ICD-10-CM | POA: Diagnosis not present

## 2020-09-06 DIAGNOSIS — I69318 Other symptoms and signs involving cognitive functions following cerebral infarction: Secondary | ICD-10-CM | POA: Diagnosis not present

## 2020-09-06 DIAGNOSIS — Z9049 Acquired absence of other specified parts of digestive tract: Secondary | ICD-10-CM | POA: Diagnosis not present

## 2020-09-06 DIAGNOSIS — I69391 Dysphagia following cerebral infarction: Secondary | ICD-10-CM | POA: Diagnosis not present

## 2020-09-06 DIAGNOSIS — Z794 Long term (current) use of insulin: Secondary | ICD-10-CM | POA: Diagnosis not present

## 2020-09-08 DIAGNOSIS — Z7982 Long term (current) use of aspirin: Secondary | ICD-10-CM | POA: Diagnosis not present

## 2020-09-08 DIAGNOSIS — I69391 Dysphagia following cerebral infarction: Secondary | ICD-10-CM | POA: Diagnosis not present

## 2020-09-08 DIAGNOSIS — E1151 Type 2 diabetes mellitus with diabetic peripheral angiopathy without gangrene: Secondary | ICD-10-CM | POA: Diagnosis not present

## 2020-09-08 DIAGNOSIS — I69354 Hemiplegia and hemiparesis following cerebral infarction affecting left non-dominant side: Secondary | ICD-10-CM | POA: Diagnosis not present

## 2020-09-08 DIAGNOSIS — K589 Irritable bowel syndrome without diarrhea: Secondary | ICD-10-CM | POA: Diagnosis not present

## 2020-09-08 DIAGNOSIS — Z794 Long term (current) use of insulin: Secondary | ICD-10-CM | POA: Diagnosis not present

## 2020-09-08 DIAGNOSIS — Z9049 Acquired absence of other specified parts of digestive tract: Secondary | ICD-10-CM | POA: Diagnosis not present

## 2020-09-08 DIAGNOSIS — I2510 Atherosclerotic heart disease of native coronary artery without angina pectoris: Secondary | ICD-10-CM | POA: Diagnosis not present

## 2020-09-08 DIAGNOSIS — Z993 Dependence on wheelchair: Secondary | ICD-10-CM | POA: Diagnosis not present

## 2020-09-08 DIAGNOSIS — I69320 Aphasia following cerebral infarction: Secondary | ICD-10-CM | POA: Diagnosis not present

## 2020-09-08 DIAGNOSIS — K219 Gastro-esophageal reflux disease without esophagitis: Secondary | ICD-10-CM | POA: Diagnosis not present

## 2020-09-08 DIAGNOSIS — E785 Hyperlipidemia, unspecified: Secondary | ICD-10-CM | POA: Diagnosis not present

## 2020-09-08 DIAGNOSIS — I252 Old myocardial infarction: Secondary | ICD-10-CM | POA: Diagnosis not present

## 2020-09-08 DIAGNOSIS — G43909 Migraine, unspecified, not intractable, without status migrainosus: Secondary | ICD-10-CM | POA: Diagnosis not present

## 2020-09-08 DIAGNOSIS — E569 Vitamin deficiency, unspecified: Secondary | ICD-10-CM | POA: Diagnosis not present

## 2020-09-08 DIAGNOSIS — M1990 Unspecified osteoarthritis, unspecified site: Secondary | ICD-10-CM | POA: Diagnosis not present

## 2020-09-08 DIAGNOSIS — I119 Hypertensive heart disease without heart failure: Secondary | ICD-10-CM | POA: Diagnosis not present

## 2020-09-08 DIAGNOSIS — Z7902 Long term (current) use of antithrombotics/antiplatelets: Secondary | ICD-10-CM | POA: Diagnosis not present

## 2020-09-08 DIAGNOSIS — I69318 Other symptoms and signs involving cognitive functions following cerebral infarction: Secondary | ICD-10-CM | POA: Diagnosis not present

## 2020-09-08 DIAGNOSIS — Z7984 Long term (current) use of oral hypoglycemic drugs: Secondary | ICD-10-CM | POA: Diagnosis not present

## 2020-09-08 DIAGNOSIS — Z8616 Personal history of COVID-19: Secondary | ICD-10-CM | POA: Diagnosis not present

## 2020-09-09 ENCOUNTER — Ambulatory Visit: Payer: Medicare Other | Admitting: Neurology

## 2020-09-15 DIAGNOSIS — I2510 Atherosclerotic heart disease of native coronary artery without angina pectoris: Secondary | ICD-10-CM | POA: Diagnosis not present

## 2020-09-15 DIAGNOSIS — I69320 Aphasia following cerebral infarction: Secondary | ICD-10-CM | POA: Diagnosis not present

## 2020-09-15 DIAGNOSIS — Z993 Dependence on wheelchair: Secondary | ICD-10-CM | POA: Diagnosis not present

## 2020-09-15 DIAGNOSIS — Z9049 Acquired absence of other specified parts of digestive tract: Secondary | ICD-10-CM | POA: Diagnosis not present

## 2020-09-15 DIAGNOSIS — Z7982 Long term (current) use of aspirin: Secondary | ICD-10-CM | POA: Diagnosis not present

## 2020-09-15 DIAGNOSIS — G43909 Migraine, unspecified, not intractable, without status migrainosus: Secondary | ICD-10-CM | POA: Diagnosis not present

## 2020-09-15 DIAGNOSIS — I119 Hypertensive heart disease without heart failure: Secondary | ICD-10-CM | POA: Diagnosis not present

## 2020-09-15 DIAGNOSIS — I69354 Hemiplegia and hemiparesis following cerebral infarction affecting left non-dominant side: Secondary | ICD-10-CM | POA: Diagnosis not present

## 2020-09-15 DIAGNOSIS — I69318 Other symptoms and signs involving cognitive functions following cerebral infarction: Secondary | ICD-10-CM | POA: Diagnosis not present

## 2020-09-15 DIAGNOSIS — Z794 Long term (current) use of insulin: Secondary | ICD-10-CM | POA: Diagnosis not present

## 2020-09-15 DIAGNOSIS — K219 Gastro-esophageal reflux disease without esophagitis: Secondary | ICD-10-CM | POA: Diagnosis not present

## 2020-09-15 DIAGNOSIS — Z8616 Personal history of COVID-19: Secondary | ICD-10-CM | POA: Diagnosis not present

## 2020-09-15 DIAGNOSIS — I252 Old myocardial infarction: Secondary | ICD-10-CM | POA: Diagnosis not present

## 2020-09-15 DIAGNOSIS — E569 Vitamin deficiency, unspecified: Secondary | ICD-10-CM | POA: Diagnosis not present

## 2020-09-15 DIAGNOSIS — E785 Hyperlipidemia, unspecified: Secondary | ICD-10-CM | POA: Diagnosis not present

## 2020-09-15 DIAGNOSIS — I69391 Dysphagia following cerebral infarction: Secondary | ICD-10-CM | POA: Diagnosis not present

## 2020-09-15 DIAGNOSIS — M1990 Unspecified osteoarthritis, unspecified site: Secondary | ICD-10-CM | POA: Diagnosis not present

## 2020-09-15 DIAGNOSIS — Z7902 Long term (current) use of antithrombotics/antiplatelets: Secondary | ICD-10-CM | POA: Diagnosis not present

## 2020-09-15 DIAGNOSIS — K589 Irritable bowel syndrome without diarrhea: Secondary | ICD-10-CM | POA: Diagnosis not present

## 2020-09-15 DIAGNOSIS — E1151 Type 2 diabetes mellitus with diabetic peripheral angiopathy without gangrene: Secondary | ICD-10-CM | POA: Diagnosis not present

## 2020-09-15 DIAGNOSIS — Z7984 Long term (current) use of oral hypoglycemic drugs: Secondary | ICD-10-CM | POA: Diagnosis not present

## 2020-09-17 DIAGNOSIS — I2510 Atherosclerotic heart disease of native coronary artery without angina pectoris: Secondary | ICD-10-CM | POA: Diagnosis not present

## 2020-09-17 DIAGNOSIS — Z8616 Personal history of COVID-19: Secondary | ICD-10-CM | POA: Diagnosis not present

## 2020-09-17 DIAGNOSIS — I252 Old myocardial infarction: Secondary | ICD-10-CM | POA: Diagnosis not present

## 2020-09-17 DIAGNOSIS — I119 Hypertensive heart disease without heart failure: Secondary | ICD-10-CM | POA: Diagnosis not present

## 2020-09-17 DIAGNOSIS — E1151 Type 2 diabetes mellitus with diabetic peripheral angiopathy without gangrene: Secondary | ICD-10-CM | POA: Diagnosis not present

## 2020-09-17 DIAGNOSIS — E569 Vitamin deficiency, unspecified: Secondary | ICD-10-CM | POA: Diagnosis not present

## 2020-09-17 DIAGNOSIS — E785 Hyperlipidemia, unspecified: Secondary | ICD-10-CM | POA: Diagnosis not present

## 2020-09-17 DIAGNOSIS — Z794 Long term (current) use of insulin: Secondary | ICD-10-CM | POA: Diagnosis not present

## 2020-09-17 DIAGNOSIS — Z9049 Acquired absence of other specified parts of digestive tract: Secondary | ICD-10-CM | POA: Diagnosis not present

## 2020-09-17 DIAGNOSIS — I69320 Aphasia following cerebral infarction: Secondary | ICD-10-CM | POA: Diagnosis not present

## 2020-09-17 DIAGNOSIS — Z7982 Long term (current) use of aspirin: Secondary | ICD-10-CM | POA: Diagnosis not present

## 2020-09-17 DIAGNOSIS — G43909 Migraine, unspecified, not intractable, without status migrainosus: Secondary | ICD-10-CM | POA: Diagnosis not present

## 2020-09-17 DIAGNOSIS — Z7902 Long term (current) use of antithrombotics/antiplatelets: Secondary | ICD-10-CM | POA: Diagnosis not present

## 2020-09-17 DIAGNOSIS — M1990 Unspecified osteoarthritis, unspecified site: Secondary | ICD-10-CM | POA: Diagnosis not present

## 2020-09-17 DIAGNOSIS — K219 Gastro-esophageal reflux disease without esophagitis: Secondary | ICD-10-CM | POA: Diagnosis not present

## 2020-09-17 DIAGNOSIS — I69391 Dysphagia following cerebral infarction: Secondary | ICD-10-CM | POA: Diagnosis not present

## 2020-09-17 DIAGNOSIS — Z993 Dependence on wheelchair: Secondary | ICD-10-CM | POA: Diagnosis not present

## 2020-09-17 DIAGNOSIS — I69354 Hemiplegia and hemiparesis following cerebral infarction affecting left non-dominant side: Secondary | ICD-10-CM | POA: Diagnosis not present

## 2020-09-17 DIAGNOSIS — I69318 Other symptoms and signs involving cognitive functions following cerebral infarction: Secondary | ICD-10-CM | POA: Diagnosis not present

## 2020-09-17 DIAGNOSIS — Z7984 Long term (current) use of oral hypoglycemic drugs: Secondary | ICD-10-CM | POA: Diagnosis not present

## 2020-09-17 DIAGNOSIS — K589 Irritable bowel syndrome without diarrhea: Secondary | ICD-10-CM | POA: Diagnosis not present

## 2020-09-20 DIAGNOSIS — G459 Transient cerebral ischemic attack, unspecified: Secondary | ICD-10-CM | POA: Diagnosis not present

## 2020-09-20 DIAGNOSIS — R531 Weakness: Secondary | ICD-10-CM | POA: Diagnosis not present

## 2020-09-20 DIAGNOSIS — N171 Acute kidney failure with acute cortical necrosis: Secondary | ICD-10-CM | POA: Diagnosis not present

## 2020-09-20 DIAGNOSIS — N183 Chronic kidney disease, stage 3 unspecified: Secondary | ICD-10-CM | POA: Diagnosis not present

## 2020-09-21 DIAGNOSIS — I69320 Aphasia following cerebral infarction: Secondary | ICD-10-CM | POA: Diagnosis not present

## 2020-09-21 DIAGNOSIS — I2510 Atherosclerotic heart disease of native coronary artery without angina pectoris: Secondary | ICD-10-CM | POA: Diagnosis not present

## 2020-09-21 DIAGNOSIS — I69391 Dysphagia following cerebral infarction: Secondary | ICD-10-CM | POA: Diagnosis not present

## 2020-09-21 DIAGNOSIS — K219 Gastro-esophageal reflux disease without esophagitis: Secondary | ICD-10-CM | POA: Diagnosis not present

## 2020-09-21 DIAGNOSIS — Z7984 Long term (current) use of oral hypoglycemic drugs: Secondary | ICD-10-CM | POA: Diagnosis not present

## 2020-09-21 DIAGNOSIS — E569 Vitamin deficiency, unspecified: Secondary | ICD-10-CM | POA: Diagnosis not present

## 2020-09-21 DIAGNOSIS — K589 Irritable bowel syndrome without diarrhea: Secondary | ICD-10-CM | POA: Diagnosis not present

## 2020-09-21 DIAGNOSIS — I69354 Hemiplegia and hemiparesis following cerebral infarction affecting left non-dominant side: Secondary | ICD-10-CM | POA: Diagnosis not present

## 2020-09-21 DIAGNOSIS — I252 Old myocardial infarction: Secondary | ICD-10-CM | POA: Diagnosis not present

## 2020-09-21 DIAGNOSIS — Z9049 Acquired absence of other specified parts of digestive tract: Secondary | ICD-10-CM | POA: Diagnosis not present

## 2020-09-21 DIAGNOSIS — G43909 Migraine, unspecified, not intractable, without status migrainosus: Secondary | ICD-10-CM | POA: Diagnosis not present

## 2020-09-21 DIAGNOSIS — Z993 Dependence on wheelchair: Secondary | ICD-10-CM | POA: Diagnosis not present

## 2020-09-21 DIAGNOSIS — E785 Hyperlipidemia, unspecified: Secondary | ICD-10-CM | POA: Diagnosis not present

## 2020-09-21 DIAGNOSIS — M1990 Unspecified osteoarthritis, unspecified site: Secondary | ICD-10-CM | POA: Diagnosis not present

## 2020-09-21 DIAGNOSIS — Z7982 Long term (current) use of aspirin: Secondary | ICD-10-CM | POA: Diagnosis not present

## 2020-09-21 DIAGNOSIS — Z7902 Long term (current) use of antithrombotics/antiplatelets: Secondary | ICD-10-CM | POA: Diagnosis not present

## 2020-09-21 DIAGNOSIS — E1151 Type 2 diabetes mellitus with diabetic peripheral angiopathy without gangrene: Secondary | ICD-10-CM | POA: Diagnosis not present

## 2020-09-21 DIAGNOSIS — Z794 Long term (current) use of insulin: Secondary | ICD-10-CM | POA: Diagnosis not present

## 2020-09-21 DIAGNOSIS — I119 Hypertensive heart disease without heart failure: Secondary | ICD-10-CM | POA: Diagnosis not present

## 2020-09-21 DIAGNOSIS — Z8616 Personal history of COVID-19: Secondary | ICD-10-CM | POA: Diagnosis not present

## 2020-09-21 DIAGNOSIS — I69318 Other symptoms and signs involving cognitive functions following cerebral infarction: Secondary | ICD-10-CM | POA: Diagnosis not present

## 2020-09-23 DIAGNOSIS — B351 Tinea unguium: Secondary | ICD-10-CM | POA: Diagnosis not present

## 2020-09-23 DIAGNOSIS — E1140 Type 2 diabetes mellitus with diabetic neuropathy, unspecified: Secondary | ICD-10-CM | POA: Diagnosis not present

## 2020-09-25 DIAGNOSIS — U071 COVID-19: Secondary | ICD-10-CM | POA: Diagnosis not present

## 2020-09-25 DIAGNOSIS — M6281 Muscle weakness (generalized): Secondary | ICD-10-CM | POA: Diagnosis not present

## 2020-09-27 IMAGING — DX PORTABLE CHEST - 1 VIEW
1 series · 1 of 1 positions shown · non-contrast
Comparison: PA and lateral chest 09/25/2018 and 06/26/2018.

CLINICAL DATA: Onset shortness of breaths afternoon.

EXAM:
PORTABLE CHEST 1 VIEW

[chest ap]
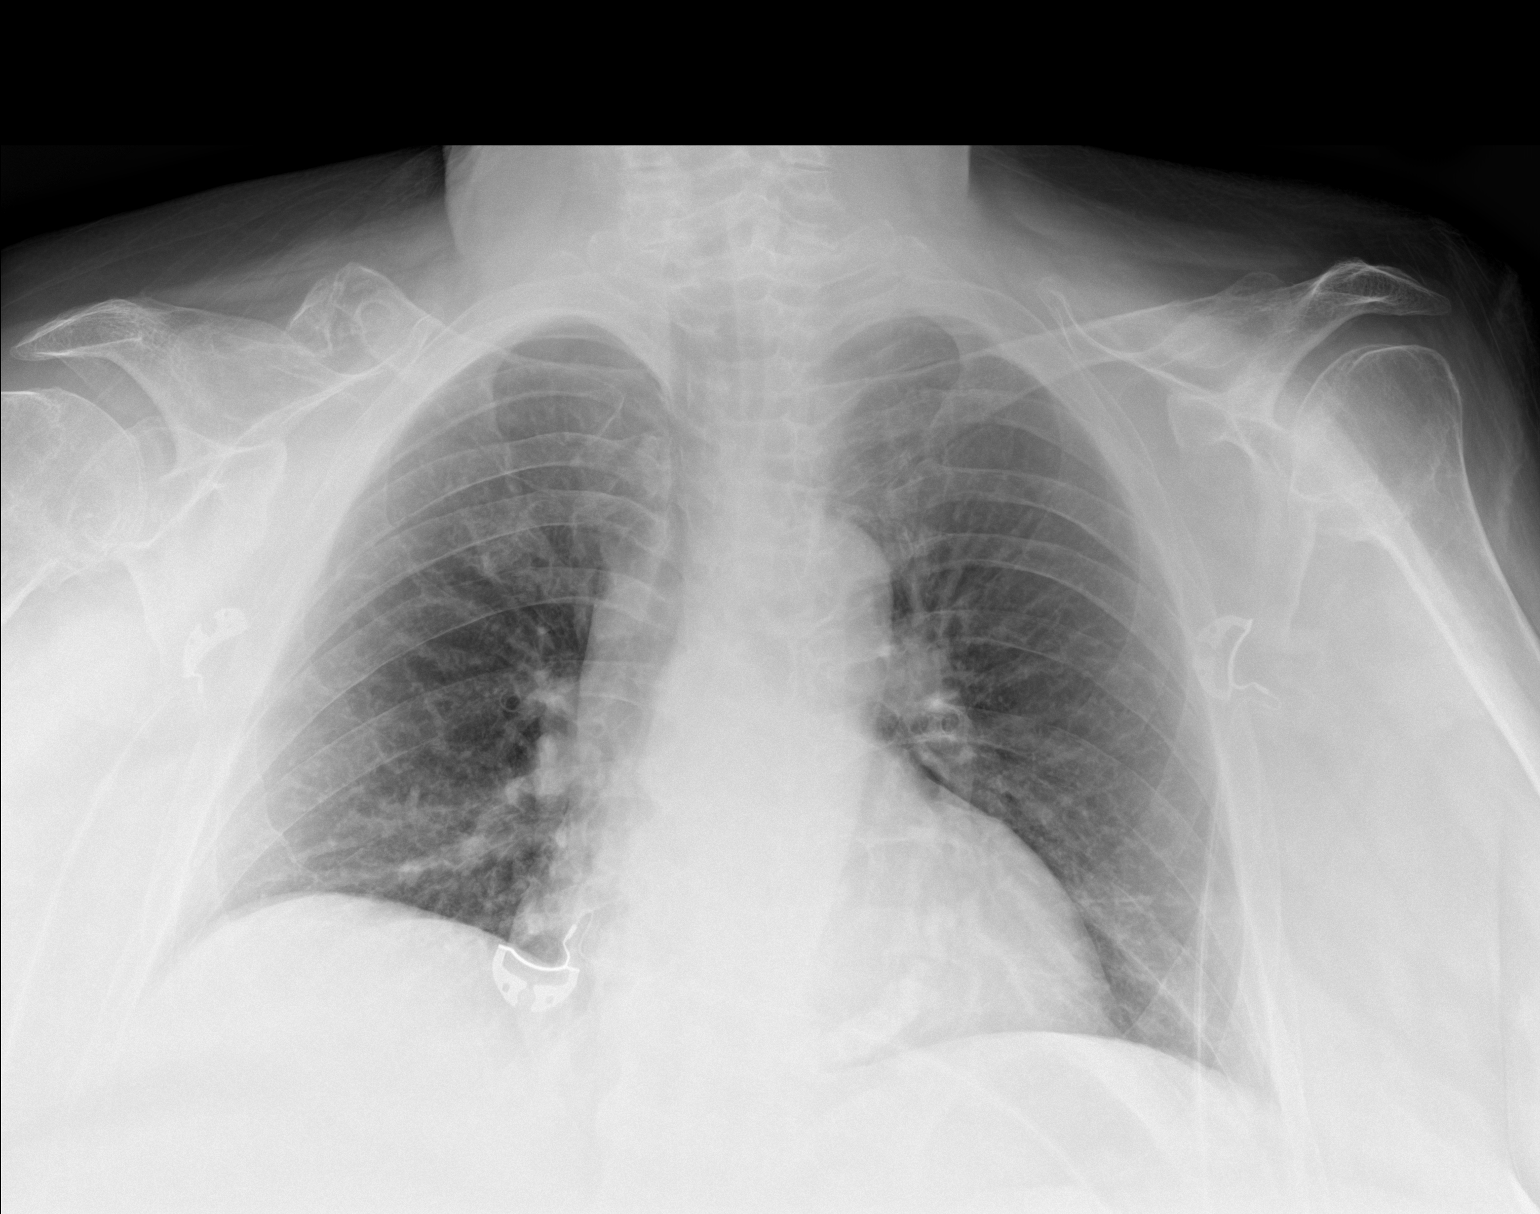

[1 of 1 positions shown; findings below may reference images not displayed]

FINDINGS: Lungs clear. Heart size normal. No pneumothorax or pleural effusion.
No acute bony abnormality. Remote right clavicle fracture noted.
IMPRESSION: No acute disease.

## 2020-09-27 IMAGING — CT CT ANGIOGRAPHY CHEST
2 of 6 series · 17 of 36 positions shown · IV contrast (APPLIED)
Comparison: Chest CT October 15, 2016; chest radiograph November 29, 2018.

CLINICAL DATA: Shortness of breath

EXAM:
CT ANGIOGRAPHY CHEST WITH CONTRAST
TECHNIQUE: Multidetector CT imaging of the chest was performed using the
standard protocol during bolus administration of intravenous
contrast. Multiplanar CT image reconstructions and MIPs were
obtained to evaluate the vascular anatomy.
CONTRAST:  75mL OMNIPAQUE IOHEXOL 350 MG/ML SOLN

[Series 5: thins · axial · 0.69mm/px · z∈[-299,-56]mm · 16 of 271 slices shown]
[im 14/271  lung]
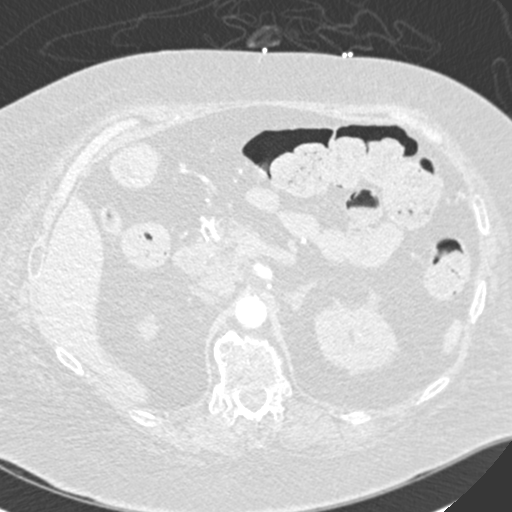
[im 28/271  mediastinal]
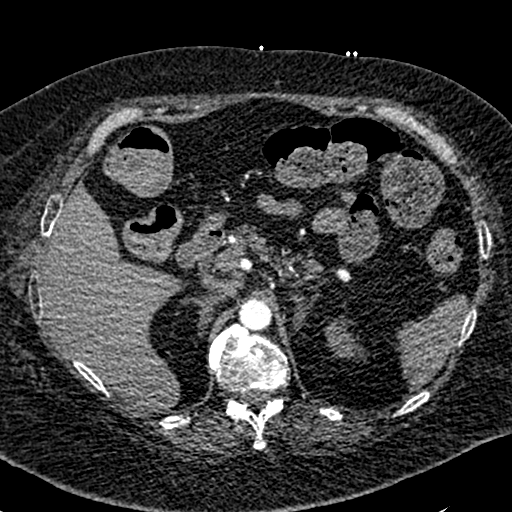
[im 41/271  lung]
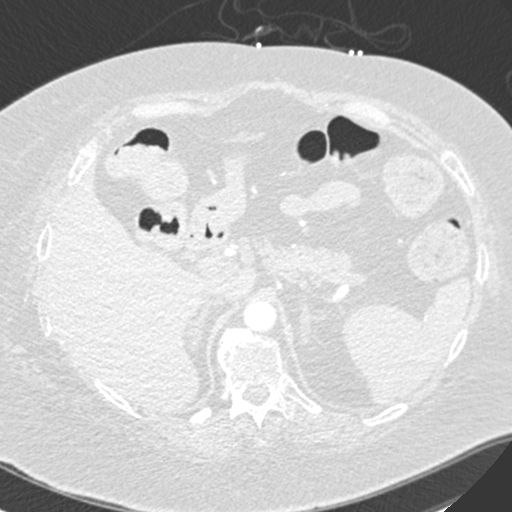
[im 68/271  mediastinal]
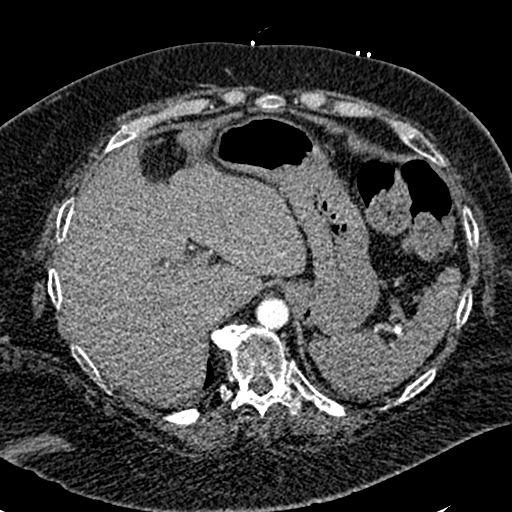
[im 82/271  lung]
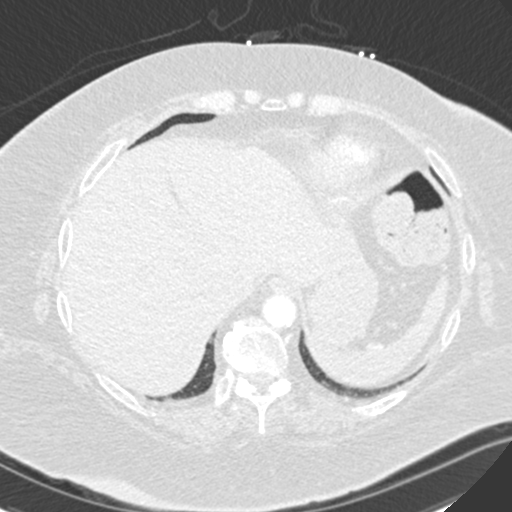
[im 95/271  mediastinal]
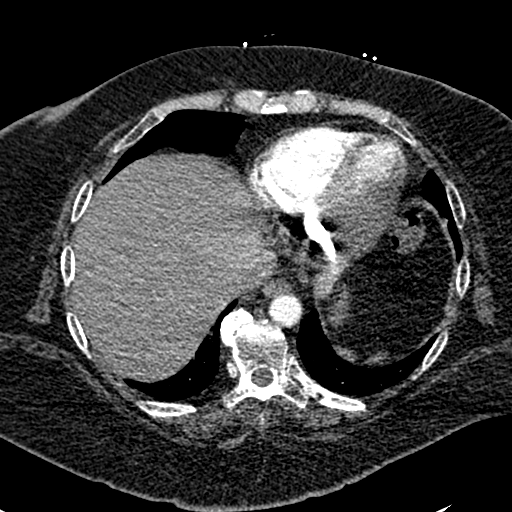
[im 109/271  lung]
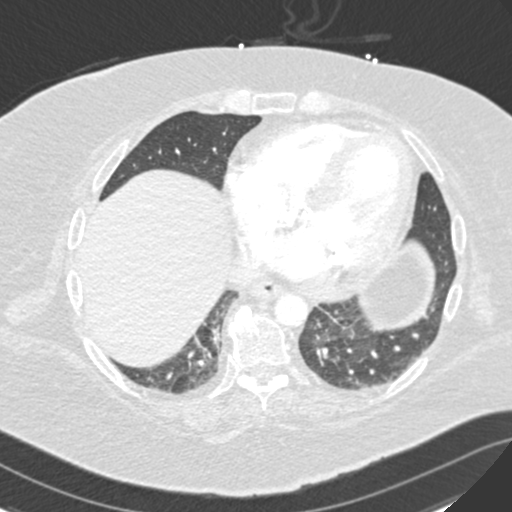
[im 122/271  mediastinal]
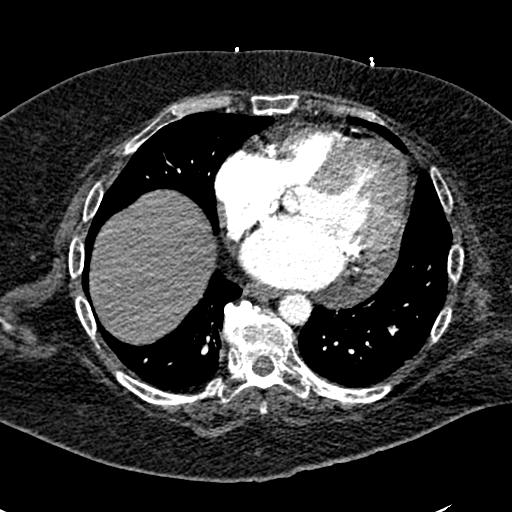
[im 149/271  lung]
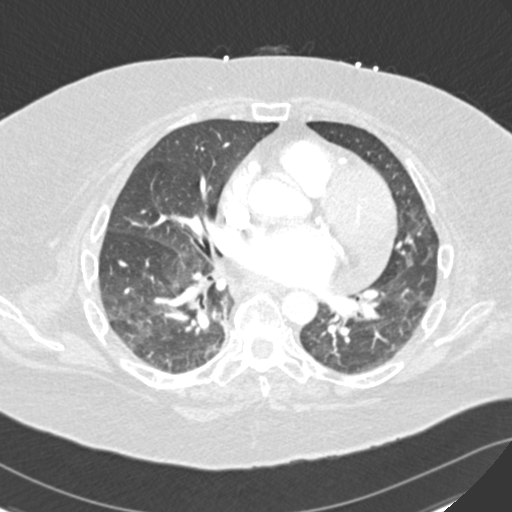
[im 163/271  mediastinal]
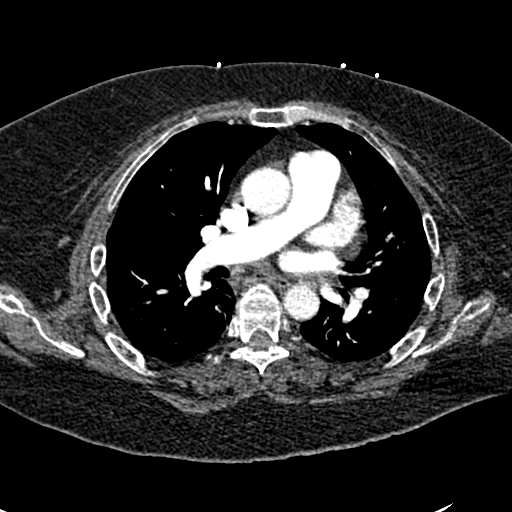
[im 176/271  lung]
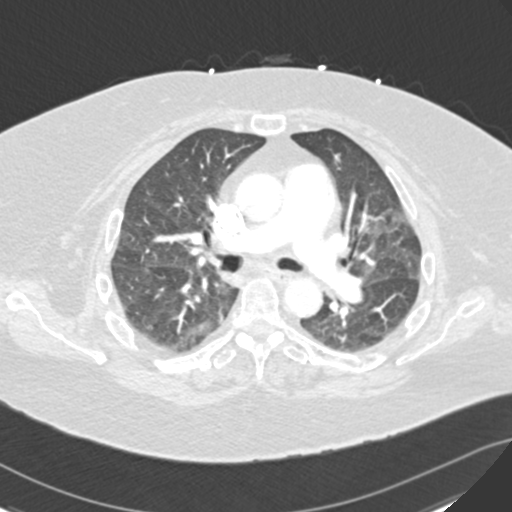
[im 190/271  mediastinal]
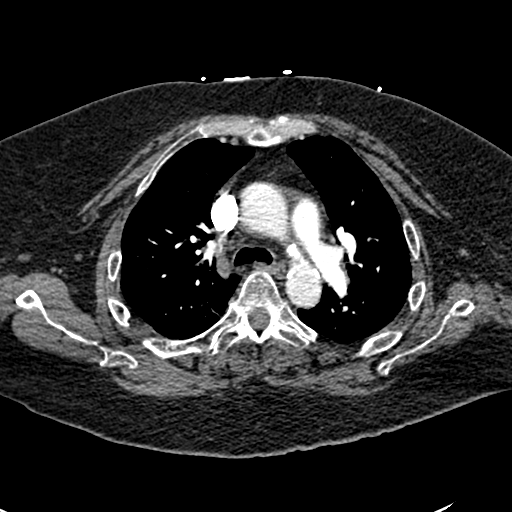
[im 203/271  lung]
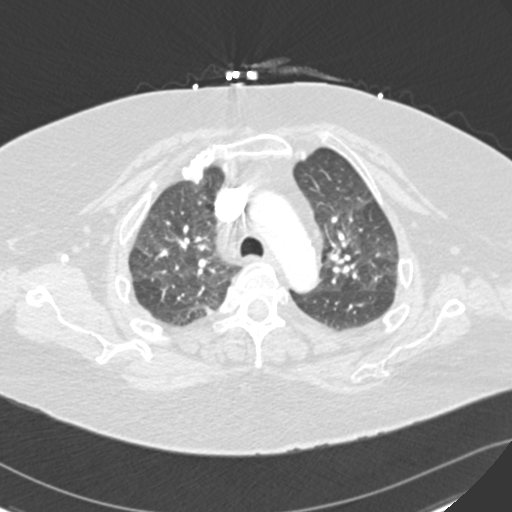
[im 230/271  mediastinal]
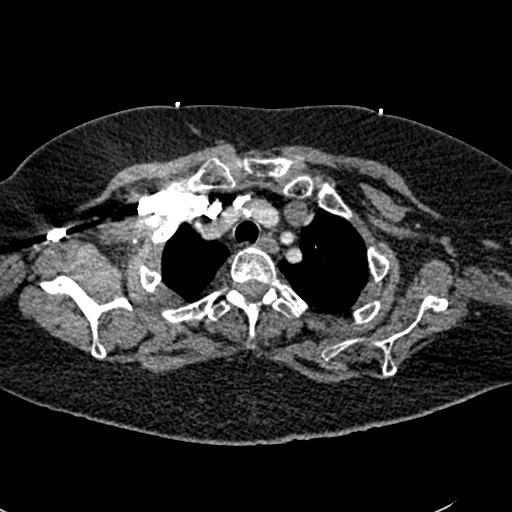
[im 244/271  lung]
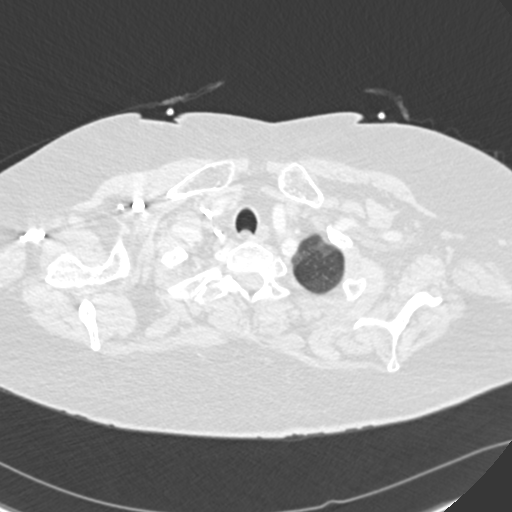
[im 257/271  mediastinal]
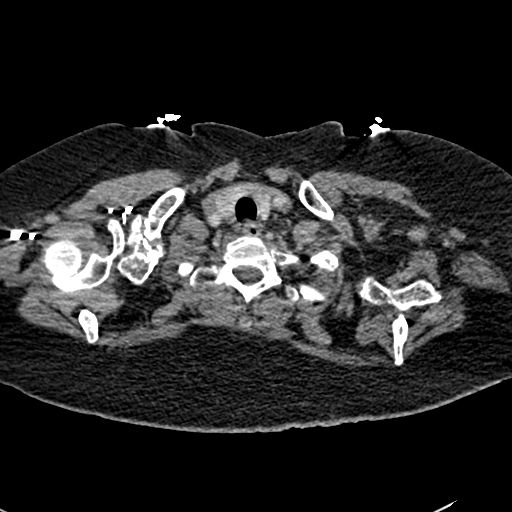

[Series 7: coronal mpr · coronal · 0.58mm/px · 1 of 95 slices shown]
[im 48/95  mediastinal]
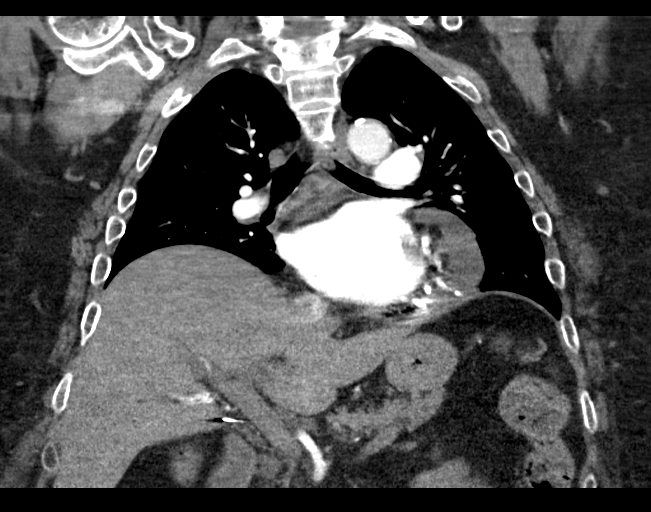

[17 of 36 positions shown; findings below may reference images not displayed]

FINDINGS: Cardiovascular: There is no demonstrable pulmonary embolus. There is
no thoracic aortic aneurysm or dissection. There are scattered foci
of calcification in the visualized great vessels with the greatest
degree of narrowing in the proximal left subclavian artery. There
are foci of aortic atherosclerosis. There are foci of coronary
artery calcification. There is a small pericardial effusion. There
is no appreciable pericardial thickening.

Mediastinum/Nodes: Visualized thyroid appears normal. There is no
demonstrable thoracic adenopathy. No esophageal lesions are evident.

Lungs/Pleura: There is mosaic attenuation throughout the lungs,
likely indicative of small airways obstructive disease. There are
areas of mild atelectatic change. There is no consolidation. On
axial slice 24 series 6, there is a 6 x 5 mm nodular opacity
abutting the pleura in the posterior segment of the right upper
lobe. There is no pleural effusion or pleural thickening.

Upper Abdomen: Gallbladder is absent. Visualized upper abdominal
structures otherwise appear unremarkable except for aortic
atherosclerosis.

Musculoskeletal: There is degenerative change in the thoracic spine.
There are no blastic or lytic bone lesions. There are no evident
chest wall lesions.

Review of the MIP images confirms the above findings.
IMPRESSION: 1. No demonstrable pulmonary embolus. No thoracic aortic aneurysm or
dissection. There is aortic atherosclerosis. There are foci of great
vessel and coronary artery calcification. There appears to be
hemodynamically significant obstruction at the origin of the left
subclavian artery.

2. Mosaic attenuation in the lungs, likely indicative of small
airways obstructive disease. There is also patchy atelectatic change
bilaterally. No frank airspace consolidation. No pleural effusion.

3. 6 mm nodular opacity in the posterior segment right upper lobe.
Non-contrast chest CT at 6-12 months is recommended. If the nodule
is stable at time of repeat CT, then future CT at 18-24 months (from
today's scan) is considered optional for low-risk patients, but is
recommended for high-risk patients. This recommendation follows the
consensus statement: Guidelines for Management of Incidental
Pulmonary Nodules Detected on CT Images: From the [HOSPITAL]

4.  Small pericardial effusion.

5.  No appreciable thoracic adenopathy.

6.  Gallbladder absent.

Aortic Atherosclerosis (8IS0H-VO7.7).

## 2020-09-29 DIAGNOSIS — E785 Hyperlipidemia, unspecified: Secondary | ICD-10-CM | POA: Diagnosis not present

## 2020-09-29 DIAGNOSIS — I2510 Atherosclerotic heart disease of native coronary artery without angina pectoris: Secondary | ICD-10-CM | POA: Diagnosis not present

## 2020-09-29 DIAGNOSIS — E569 Vitamin deficiency, unspecified: Secondary | ICD-10-CM | POA: Diagnosis not present

## 2020-09-29 DIAGNOSIS — E1151 Type 2 diabetes mellitus with diabetic peripheral angiopathy without gangrene: Secondary | ICD-10-CM | POA: Diagnosis not present

## 2020-09-29 DIAGNOSIS — K589 Irritable bowel syndrome without diarrhea: Secondary | ICD-10-CM | POA: Diagnosis not present

## 2020-09-29 DIAGNOSIS — Z7982 Long term (current) use of aspirin: Secondary | ICD-10-CM | POA: Diagnosis not present

## 2020-09-29 DIAGNOSIS — I252 Old myocardial infarction: Secondary | ICD-10-CM | POA: Diagnosis not present

## 2020-09-29 DIAGNOSIS — I69354 Hemiplegia and hemiparesis following cerebral infarction affecting left non-dominant side: Secondary | ICD-10-CM | POA: Diagnosis not present

## 2020-09-29 DIAGNOSIS — I69320 Aphasia following cerebral infarction: Secondary | ICD-10-CM | POA: Diagnosis not present

## 2020-09-29 DIAGNOSIS — Z794 Long term (current) use of insulin: Secondary | ICD-10-CM | POA: Diagnosis not present

## 2020-09-29 DIAGNOSIS — M1990 Unspecified osteoarthritis, unspecified site: Secondary | ICD-10-CM | POA: Diagnosis not present

## 2020-09-29 DIAGNOSIS — Z993 Dependence on wheelchair: Secondary | ICD-10-CM | POA: Diagnosis not present

## 2020-09-29 DIAGNOSIS — Z9049 Acquired absence of other specified parts of digestive tract: Secondary | ICD-10-CM | POA: Diagnosis not present

## 2020-09-29 DIAGNOSIS — I69391 Dysphagia following cerebral infarction: Secondary | ICD-10-CM | POA: Diagnosis not present

## 2020-09-29 DIAGNOSIS — I69318 Other symptoms and signs involving cognitive functions following cerebral infarction: Secondary | ICD-10-CM | POA: Diagnosis not present

## 2020-09-29 DIAGNOSIS — Z7902 Long term (current) use of antithrombotics/antiplatelets: Secondary | ICD-10-CM | POA: Diagnosis not present

## 2020-09-29 DIAGNOSIS — Z8616 Personal history of COVID-19: Secondary | ICD-10-CM | POA: Diagnosis not present

## 2020-09-29 DIAGNOSIS — G43909 Migraine, unspecified, not intractable, without status migrainosus: Secondary | ICD-10-CM | POA: Diagnosis not present

## 2020-09-29 DIAGNOSIS — Z7984 Long term (current) use of oral hypoglycemic drugs: Secondary | ICD-10-CM | POA: Diagnosis not present

## 2020-09-29 DIAGNOSIS — I119 Hypertensive heart disease without heart failure: Secondary | ICD-10-CM | POA: Diagnosis not present

## 2020-09-29 DIAGNOSIS — K219 Gastro-esophageal reflux disease without esophagitis: Secondary | ICD-10-CM | POA: Diagnosis not present

## 2020-10-01 ENCOUNTER — Other Ambulatory Visit: Payer: Self-pay

## 2020-10-01 DIAGNOSIS — G40909 Epilepsy, unspecified, not intractable, without status epilepticus: Secondary | ICD-10-CM

## 2020-10-01 DIAGNOSIS — I10 Essential (primary) hypertension: Secondary | ICD-10-CM

## 2020-10-01 MED ORDER — HYDRALAZINE HCL 100 MG PO TABS: 100.0000 mg | ORAL_TABLET | Freq: Two times a day (BID) | ORAL | 3 refills | Status: DC

## 2020-10-01 MED ORDER — LEVETIRACETAM 500 MG PO TABS: 500.0000 mg | ORAL_TABLET | Freq: Two times a day (BID) | ORAL | 3 refills | Status: DC

## 2020-10-01 NOTE — Telephone Encounter (Signed)
[facility] faxed refill request for the following medications:  metoprolol succinate (TOPROL-XL) 100 MG 24 hr tablet hydrALAZINE (APRESOLINE) 100 MG tablet [[id number]] levETIRAcetam (KEPPRA) 500 MG tablet  Please advise.  Thanks, [employer]

## 2020-10-11 ENCOUNTER — Other Ambulatory Visit: Payer: Self-pay | Admitting: Family Medicine

## 2020-10-11 DIAGNOSIS — I10 Essential (primary) hypertension: Secondary | ICD-10-CM

## 2020-10-11 NOTE — Telephone Encounter (Signed)
Requested medications are due for refill today.  yes  Requested medications are on the active medications list.  yes  Last refill.07/15/2020  Future visit scheduled.   No  Notes to clinic.  Pt has not been seen for HTN in over 1 year - please advise.

## 2020-10-14 ENCOUNTER — Ambulatory Visit: Payer: Medicare Other | Admitting: Neurology

## 2020-10-14 ENCOUNTER — Other Ambulatory Visit: Payer: Self-pay | Admitting: Family Medicine

## 2020-10-14 DIAGNOSIS — N183 Chronic kidney disease, stage 3 unspecified: Secondary | ICD-10-CM

## 2020-10-14 NOTE — Telephone Encounter (Signed)
[name] was on 8/19/2021and no upcoming appointment. 90 day supply send into pharmacy.

## 2020-10-14 NOTE — Telephone Encounter (Signed)
Requested medications are due for refill today.  yes  Requested medications are on the active medications list.  yes  Last refill. 03/19/2020  Future visit scheduled.   No  Notes to clinic.  More than 3 months over due for office visit.

## 2020-10-21 DIAGNOSIS — G459 Transient cerebral ischemic attack, unspecified: Secondary | ICD-10-CM | POA: Diagnosis not present

## 2020-10-21 DIAGNOSIS — N183 Chronic kidney disease, stage 3 unspecified: Secondary | ICD-10-CM | POA: Diagnosis not present

## 2020-10-21 DIAGNOSIS — R531 Weakness: Secondary | ICD-10-CM | POA: Diagnosis not present

## 2020-10-21 DIAGNOSIS — N171 Acute kidney failure with acute cortical necrosis: Secondary | ICD-10-CM | POA: Diagnosis not present

## 2020-10-26 DIAGNOSIS — U071 COVID-19: Secondary | ICD-10-CM | POA: Diagnosis not present

## 2020-10-26 DIAGNOSIS — M6281 Muscle weakness (generalized): Secondary | ICD-10-CM | POA: Diagnosis not present

## 2020-11-04 DIAGNOSIS — E113553 Type 2 diabetes mellitus with stable proliferative diabetic retinopathy, bilateral: Secondary | ICD-10-CM | POA: Diagnosis not present

## 2020-11-04 LAB — HM DIABETES EYE EXAM

## 2020-11-08 NOTE — Progress Notes (Signed)
Date:  11/09/2020   ID:  Susan House, DOB [birth year], MRN [id number]  Patient Location:  [place] [address]   Provider location:   Susan House, Susan House  PCP:  Susan House., MD  Cardiologist:  Susan House Susan House  Chief Complaint  Patient presents with  . 6 month follow up     History of Present Illness:    Susan House is a 65 y.o. female past medical history of stroke in 2000 followed by posterior circulatory TIA in June of 2010,  chronic dizziness in the past secondary to dehydration,  Chronic chest pain restless leg syndrome, home type 2 diabetes, poorly controlled Morbid obesity She has residual left side weakness arm and leg from her stroke Carotid duplex showed 50-69% stenosis in the House internal carotid artery. <50% stenosis  Left CT scan chest 10/15/2016 showing diffuse three-vessel coronary disease who presents for routine follow-up of her stroke, chest pain symptoms.  In follow-up today she presents with family Recent weight loss, not eating as much Stays inside with her husband, limited ADLs, No regular exercise program, legs weak  Tolerating aspirin Plavix Concern for prior stroke when she was in the hospital June 2021 Denies any recurrence of TIA or stroke symptoms  Blood pressure running low on today's visit, has been doing so recently On quite a few blood pressure medications reviewed with her Denies UTI symptoms today   A1c  last October 2021, 9.6  EKG personally reviewed by myself on todays visit Shows normal sinus rhythm rate 69 bpm no significant ST-T wave changes  Other past medical history reviewed Was in the hospital 12/2019,   worsening left sided weakness. Etiology is not clear. CT head is negative. MRI of the brain did not show new stroke. Seen by neurology, recommended she stay on aspirin Plavix For weakness, she did some home PT,  nurse visits  Prior  urinary tract infection Klebsiella pneumonia   hospital August 30, 2018 with syncope secondary to hypoglycemia  fell into [place] developed significant bruising on the left side of her face. Presented to the hospital with headache, chest pain In the hospital had work-up for her chest pain Stress test September 26, 2018 Pharmacological myocardial perfusion imaging study with no significant  ischemia Normal wall motion, EF estimated at 71% No EKG changes concerning for ischemia at peak stress or in recovery. Low risk scan   Admission September 2019 Left-sided weakness with TIA  possible seizure episode on 10/15/2016. Seen by neurology, MRI showing no stroke Question of seizure   emergency room 11/22/2016 for chest pain radiating through to her back centralized chest pain  CT scan chest 10/15/2016 showing diffuse three-vessel coronary disease  Hospitalization December 2017 for weakness, dehydration, possible TIA, GI bleed, diarrhea Off plavix Had colo as an outpatient, was told that she had "ulcerations"   In hospital 07/07/16, possible TIA Could not hear in both the ears ,  felt her speech was slurred. CT scan of the head did not show any acute intracranial abnormality except for chronic white matter changes  Echo: No cardiac source of emboli was identified, EF 60-65%.  MRA HEAD Flow noted within the distal vertebral arteries and basilar artery. significant atherosclerotic changes of the posterior cerebral arteries and superior cerebral arteries greater on the House.  cardiac workup numerous echocardiograms dating back to 2008,  cardiac catheterization  Prior carotid ultrasound 2008 showing mild to moderate carotid  arterial disease. Stress test July 2011 showing no ischemia.  Prior CV studies:   The following studies were reviewed today: Echo 03/2018 Left ventricle: The cavity size was normal. There was moderate concentric hypertrophy. Systolic function was  normal. The estimated ejection fraction was in the range of 55% to 60%.   Past Medical History:  Diagnosis Date  . Allergy   . Arthritis   . Atypical chest pain    a. Reportedly nl cath in the past; b. 12/2009 MV: No ischemia/infarct. EF 87%.  . Carotid arterial disease ([place])    a. 07/9164 CTA head/neck: LICA 06Y, RICA 04-->HTX rx.  . Cellulitis and abscess of face   . Chronic Dizziness   . Coronary artery calcification seen on CT scan    a. 10/2016 CTA chest: Atherosclerotic calcifications Ao, cors, and prox great vessels.  . Depression   . Diabetes mellitus ([place])   . Diastolic dysfunction    a. 2008 Echo: Nl EF; b. 2011 Echo: Nl EF; c. 06/2016 Echo: EF 55-60%, Gr1DD; d. 03/2018 Echo: EF 55-60%, no rwma, Gr1DD, mod dil LA.  . Edema   . GERD (gastroesophageal reflux disease)   . Headache   . Hematuria   . Hyperlipidemia   . Hypertension   . IBS (irritable bowel syndrome)   . Migraine   . Morbid obesity ([place])   . Nocturia   . Possible Seizure ([place])    a. 10/2016 ? sz. MRI neg for stroke.  . Stroke ([place]) 2000  . TIA (transient ischemic attack) 2010 & 2019   a. 2010; b. 03/2018 Left sided wkns-->MRI neg for stroke. CTA head/neck: House A2 cut off/occlusion w/ collats extending over R cerebral convexity. LICA 77S, RICA 45, RSubclavian 50->med rx.  . Urgency of micturation   . Urinary frequency   . Urinary incontinence    Past Surgical History:  Procedure Laterality Date  . ABDOMINAL HYSTERECTOMY    . ABDOMINOPLASTY    . APPENDECTOMY    . BACK SURGERY     extensive spinal fusion  . carpel tunnel surgery    . CATARACT EXTRACTION     x 2  . CESAREAN SECTION    . CHOLECYSTECTOMY    . COLONOSCOPY  2010  . COLONOSCOPY WITH PROPOFOL N/A 07/11/2016   Procedure: COLONOSCOPY WITH PROPOFOL;  Surgeon: Lucilla Lame, MD;  Location: [facility] ENDOSCOPY;  Service: Endoscopy;  Laterality: N/A;  . COLONOSCOPY WITH PROPOFOL N/A 03/26/2018   Procedure: COLONOSCOPY WITH PROPOFOL;  Surgeon: Lucilla Lame, MD;  Location: [facility] ENDOSCOPY;  Service: Endoscopy;  Laterality: N/A;  . ESOPHAGOGASTRODUODENOSCOPY (EGD) WITH PROPOFOL N/A 03/26/2018   Procedure: ESOPHAGOGASTRODUODENOSCOPY (EGD) WITH PROPOFOL;  Surgeon: Lucilla Lame, MD;  Location: [facility] ENDOSCOPY;  Service: Endoscopy;  Laterality: N/A;  . EYE SURGERY    . HERNIA REPAIR    . SHOULDER SURGERY    . TEE WITHOUT CARDIOVERSION N/A 05/07/2020   Procedure: TRANSESOPHAGEAL ECHOCARDIOGRAM (TEE);  Surgeon: Nelva Bush, MD;  Location: [facility] ORS;  Service: Cardiovascular;  Laterality: N/A;  . TOE SURGERY    . UPPER GI ENDOSCOPY    . WRIST FRACTURE SURGERY Left      Current Meds  Medication Sig  . ACCU-CHEK AVIVA PLUS test strip TEST BLOOD SUGAR 3 TIMES  DAILY  . amLODipine (NORVASC) 10 MG tablet TAKE 1 TABLET BY MOUTH  DAILY  . aspirin 81 MG chewable tablet Chew 1 tablet (81 mg total) by mouth daily.  Marland Kitchen atorvastatin (LIPITOR) 20 MG tablet TAKE 1 TABLET  BY MOUTH  DAILY  . cloNIDine (CATAPRES) 0.3 MG tablet Take 0.3 mg by mouth 2 (two) times daily.  . clopidogrel (PLAVIX) 75 MG tablet TAKE 1 TABLET BY MOUTH  DAILY  . Dimethicone (CAVILON DURABLE BARRIER) 1.3 % CREA Apply barrier cream PRN  . enalapril (VASOTEC) 20 MG tablet TAKE 1 TABLET BY MOUTH  TWICE DAILY  . famotidine (PEPCID) 20 MG tablet Take 1 tablet (20 mg total) by mouth daily.  Marland Kitchen gabapentin (NEURONTIN) 100 MG capsule Take 200 mg by mouth 2 (two) times daily.   . hydrALAZINE (APRESOLINE) 100 MG tablet Take 1 tablet (100 mg total) by mouth 2 (two) times daily.  . insulin degludec (TRESIBA FLEXTOUCH) 100 UNIT/ML FlexTouch Pen INJECT UP TO 30 UNITS UNDER THE SKIN DAILY  . LANTUS SOLOSTAR 100 UNIT/ML Solostar Pen Inject into the skin.  Marland Kitchen levETIRAcetam (KEPPRA) 500 MG tablet Take 1 tablet (500 mg total) by mouth 2 (two) times daily.  . metFORMIN (GLUCOPHAGE) 1000 MG tablet TAKE 1 TABLET BY MOUTH  TWICE DAILY  . metoprolol succinate (TOPROL-XL) 100 MG 24 hr tablet TAKE 1 TABLET BY  MOUTH  DAILY  . nitroGLYCERIN (NITROSTAT) 0.4 MG SL tablet Place 1 tablet (0.4 mg total) under the tongue every 5 (five) minutes as needed for chest pain.  Marland Kitchen ondansetron (ZOFRAN) 4 MG tablet Take 1 tablet (4 mg total) by mouth every 8 (eight) hours as needed for nausea or vomiting.  . pantoprazole (PROTONIX) 40 MG tablet TAKE 1 TABLET BY MOUTH  DAILY     Allergies:   Morphine and related, Reglan [metoclopramide], Simvastatin, Betadine [povidone iodine], and Tetracyclines & related   Social History   Tobacco Use  . Smoking status: Never Smoker  . Smokeless tobacco: Never Used  Vaping Use  . Vaping Use: Never used  Substance Use Topics  . Alcohol use: No  . Drug use: No     \ Family Hx: The patient's family history includes Arrhythmia in her mother; Coronary artery disease in her father; Depression in her sister and sister; Diabetes in her father, sister, and sister; Heart attack (age of onset: 97) in her father; Heart disease in her father and mother; Hyperlipidemia in her father, mother, sister, and sister; Hypertension in her father, mother, sister, and sister; Kidney disease in her paternal grandmother; Rheum arthritis in her mother; Stroke in her father.  ROS:   Please see the history of present illness.    Review of Systems  Constitutional: Negative.   Respiratory: Negative.   Cardiovascular: Negative.   Gastrointestinal: Negative.   Musculoskeletal: Negative.        Gait instability  Neurological: Negative.   Psychiatric/Behavioral: Negative.   All other systems reviewed and are negative.     Labs/Other Tests and Data Reviewed:    Recent Labs: 05/04/2020: ALT 17; TSH 1.148 05/07/2020: BUN 38; Creatinine, Ser 1.76; Magnesium 1.9; Potassium 3.5; Sodium 136 05/08/2020: Hemoglobin 8.3; Platelets 386   Recent Lipid Panel Lab Results  Component Value Date/Time   CHOL 105 12/24/2019 05:13 AM   CHOL 110 01/18/2014 12:59 AM   TRIG 144 12/24/2019 05:13 AM   TRIG 122  01/18/2014 12:59 AM   HDL 35 (L) 12/24/2019 05:13 AM   HDL 35 (L) 01/18/2014 12:59 AM   CHOLHDL 3.0 12/24/2019 05:13 AM   LDLCALC 41 12/24/2019 05:13 AM   LDLCALC 51 01/18/2014 12:59 AM    Wt Readings from Last 3 Encounters:  11/09/20 231 lb (104.8 kg)  05/09/20 220 lb  3.8 oz (99.9 kg)  03/24/20 231 lb (104.8 kg)     Exam:    Vital Signs: Vital signs may also be detailed in the HPI BP 106/60 (BP Location: House Arm, Patient Position: Sitting, Cuff Size: Normal)   Pulse 69   Ht 5\' 2"  (1.575 m)   Wt 231 lb (104.8 kg)   SpO2 99%   BMI 42.25 kg/m   Constitutional:  oriented to person, place, and time. No distress.  HENT:  Head: Grossly normal Eyes:  no discharge. No scleral icterus.  Neck: No JVD, no carotid bruits  Cardiovascular: Regular rate and rhythm, no murmurs appreciated Pulmonary/Chest: Clear to auscultation bilaterally, no wheezes or rails Abdominal: Soft.  no distension.  no tenderness.  Musculoskeletal: Normal range of motion Neurological:  normal muscle tone. Coordination normal. No atrophy Skin: Skin warm and dry Psychiatric: normal affect, pleasant   ASSESSMENT & PLAN:    Chronic chest pain Previous  stress testing, cardiac catheterizations, echocardiograms  coronary calcification seen on CT scan Cholesterol at goal, stressed importance of diabetes control  Peripheral artery disease ([place]) Bilateral carotid disease Followed by vascular, prior carotid ultrasound, stable disease  Cerebrovascular accident (CVA) due to other mechanism [facility]) Has been evaluated by neurology, recent weakness concerning for TIA,   on her aspirin Plavix No recent events  Type 2 diabetes mellitus with complication, with long-term current use of insulin ([place]) Long history of poorly controlled diabetes Unable to exercise, chronic back and joint pain Weight slowly trending downward, eating less  Mixed hyperlipidemia Cholesterol at goal  Essential hypertension Recent low  blood pressure, orthostasis Change hydralazine to PRN Weight loss contributing Unable to exclude urinary tract issue, family will monitor   Total encounter time more than 25 minutes  Greater than 50% was spent in counseling and coordination of care with the patient    Signed, Ida Rogue, MD  11/09/2020 2:55 PM    [facility] [id number], [place],  [phone]

## 2020-11-09 ENCOUNTER — Encounter: Payer: Self-pay | Admitting: Cardiovascular Disease

## 2020-11-09 ENCOUNTER — Ambulatory Visit: Payer: Medicare Other | Admitting: Cardiovascular Disease

## 2020-11-09 ENCOUNTER — Other Ambulatory Visit: Payer: Self-pay

## 2020-11-09 ENCOUNTER — Other Ambulatory Visit: Payer: Self-pay | Admitting: Family Medicine

## 2020-11-09 VITALS — BP 106/60 | HR 69 | Ht 62.0 in | Wt 231.0 lb

## 2020-11-09 DIAGNOSIS — E0822 Diabetes mellitus due to underlying condition with diabetic chronic kidney disease: Secondary | ICD-10-CM

## 2020-11-09 DIAGNOSIS — I6389 Other cerebral infarction: Secondary | ICD-10-CM

## 2020-11-09 DIAGNOSIS — E118 Type 2 diabetes mellitus with unspecified complications: Secondary | ICD-10-CM

## 2020-11-09 DIAGNOSIS — I10 Essential (primary) hypertension: Secondary | ICD-10-CM

## 2020-11-09 DIAGNOSIS — I739 Peripheral vascular disease, unspecified: Secondary | ICD-10-CM

## 2020-11-09 DIAGNOSIS — I209 Angina pectoris, unspecified: Secondary | ICD-10-CM

## 2020-11-09 DIAGNOSIS — I6523 Occlusion and stenosis of bilateral carotid arteries: Secondary | ICD-10-CM | POA: Diagnosis not present

## 2020-11-09 DIAGNOSIS — N183 Chronic kidney disease, stage 3 unspecified: Secondary | ICD-10-CM

## 2020-11-09 DIAGNOSIS — I63512 Cerebral infarction due to unspecified occlusion or stenosis of left middle cerebral artery: Secondary | ICD-10-CM | POA: Diagnosis not present

## 2020-11-09 NOTE — Telephone Encounter (Signed)
Requested medication (s) are due for refill today:  no  Requested medication (s) are on the active medication list: yes  Last refill:  09/14/2020  Future visit scheduled: no  Notes to clinic: over due for 6 month follow up    Requested Prescriptions  Pending Prescriptions Disp Refills   metoprolol succinate (TOPROL-XL) 100 MG 24 hr tablet [Pharmacy Med Name: Metoprolol Succinate ER 100 MG Oral Tablet Extended Release 24 Hour] 90 tablet 3    Sig: TAKE 1 TABLET BY MOUTH  DAILY      Cardiovascular:  Beta Blockers Failed - 11/09/2020 11:11 AM      Failed - Last BP in normal range    BP Readings from Last 1 Encounters:  05/09/20 (!) 146/61          Failed - Valid encounter within last 6 months    Recent Outpatient Visits           8 months ago Pressure injury of skin of sacral region, unspecified injury stage   [place], [place], PA-C   8 months ago Upper respiratory tract infection, unspecified type   [facility] Jerrol Banana., MD   8 months ago Urinary tract infection without hematuria, site unspecified   [facility] Jerrol Banana., MD   10 months ago Sinusitis, unspecified chronicity, unspecified location   [facility] Jerrol Banana., MD   11 months ago 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)   [facility] Jerrol Banana., MD       Future Appointments             Today Rockey Situ, Kathlene November, MD [facility]             Passed - Last Heart Rate in normal range    Pulse Readings from Last 1 Encounters:  05/09/20 77            metFORMIN (GLUCOPHAGE) 1000 MG tablet [Pharmacy Med Name: metFORMIN HCl 1000 MG Oral Tablet] 180 tablet 3    Sig: TAKE 1 TABLET BY MOUTH  TWICE DAILY      Endocrinology:  Diabetes - Biguanides Failed - 11/09/2020 11:11 AM      Failed - Cr in normal range and within 360  days    Creatinine  Date Value Ref Range Status  06/17/2014 1.29 0.60 - 1.30 mg/dL Final   Creatinine, Ser  Date Value Ref Range Status  05/07/2020 1.76 (H) 0.44 - 1.00 mg/dL Final          Failed - HBA1C is between 0 and 7.9 and within 180 days    Hemoglobin A1C  Date Value Ref Range Status  01/17/2014 9.6 (H) 4.2 - 6.3 % Final    Comment:    The American Diabetes Association recommends that a primary goal of therapy should be <7% and that physicians should reevaluate the treatment regimen in patients with HbA1c values consistently >8%.    Hgb A1c MFr Bld  Date Value Ref Range Status  04/27/2020 9.6 (H) 4.8 - 5.6 % Final    Comment:    (NOTE) Pre diabetes:          5.7%-6.4%  Diabetes:              >6.4%  Glycemic control for   <7.0% adults with diabetes           Failed - eGFR in  normal range and within 360 days    EGFR (African American)  Date Value Ref Range Status  06/17/2014 55 (L) >70m/min Final  01/18/2014 55 (L)  Final   GFR calc Af Amer  Date Value Ref Range Status  12/24/2019 46 (L) >60 mL/min Final   EGFR (Non-African Amer.)  Date Value Ref Range Status  06/17/2014 45 (L) >688mmin Final    Comment:    eGFR values <6027min/1.73 m2 may be an indication of chronic kidney disease (CKD). Calculated eGFR, using the MRDR Study equation, is useful in  patients with stable renal function. The eGFR calculation will not be reliable in acutely ill patients when serum creatinine is changing rapidly. It is not useful in patients on dialysis. The eGFR calculation may not be applicable to patients at the low and high extremes of body sizes, pregnant women, and vegetarians.   01/18/2014 47 (L)  Final    Comment:    eGFR values <75m27mn/1.73 m2 may be an indication of chronic kidney disease (CKD). Calculated eGFR is useful in patients with stable renal function. The eGFR calculation will not be reliable in acutely ill patients when serum creatinine is  changing rapidly. It is not useful in  patients on dialysis. The eGFR calculation may not be applicable to patients at the low and high extremes of body sizes, pregnant women, and vegetarians.    GFR, Estimated  Date Value Ref Range Status  05/07/2020 32 (L) >60 mL/min Final    Comment:    (NOTE) Calculated using the CKD-EPI Creatinine Equation (2021)           Failed - Valid encounter within last 6 months    Recent Outpatient Visits           8 months ago Pressure injury of skin of sacral region, unspecified injury stage   [facility]   8 months ago Upper respiratory tract infection, unspecified type   [facility]   8 months ago Urinary tract infection without hematuria, site unspecified   [facility]   10 months ago Sinusitis, unspecified chronicity, unspecified location   [facility]   11 months ago 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)   [facility]       Future Appointments             Today [facility]               amLODipine (NORVASC) 10 MG tablet [Pharmacy Med Name: amLODIPine Besylate 10 MG Oral Tablet] 90 tablet 3    Sig: TAKE 1 TABLET BY MOUTH  DAILY      Cardiovascular:  Calcium Channel Blockers Failed - 11/09/2020 11:11 AM      Failed - Last BP in normal range    BP Readings from Last 1 Encounters:  05/09/20 (!) 146/61          Failed - Valid encounter within last 6 months    Recent Outpatient Visits           8 months ago Pressure injury of skin of sacral region, unspecified injury stage   [facility]   8 months ago Upper respiratory tract infection, unspecified type   [facility]  8 months ago Urinary tract infection without hematuria, site unspecified   [facility] Jerrol Banana., MD   10 months ago Sinusitis, unspecified chronicity, unspecified location   [facility] Jerrol Banana., MD   11 months ago 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

## 2020-11-09 NOTE — Patient Instructions (Addendum)
Medication Instructions:  Please change hydralazine to as needed for pressure >145 (ok to take 1/2 dose)  Monitor pressure Call with numbers   If you need a refill on your cardiac medications before your next appointment, please call your pharmacy.    Lab work: No new labs needed   If you have labs (blood work) drawn today and your tests are completely normal, you will receive your results only by: Marland Kitchen MyChart Message (if you have MyChart) OR . A paper copy in the mail If you have any lab test that is abnormal or we need to change your treatment, we will call you to review the results.   Testing/Procedures: No new testing needed   Follow-Up: At [facility], you and your health needs are our priority.  As part of our continuing mission to provide you with exceptional heart care, we have created designated Provider Care Teams.  These Care Teams include your primary Cardiologist (physician) and Advanced Practice Providers (APPs -  Physician Assistants and Nurse Practitioners) who all work together to provide you with the care you need, when you need it.  . You will need a follow up appointment in 6 months  . Providers on your designated Care Team:   . Murray Hodgkins, NP . Christell Faith, PA-C . Marrianne Mood, PA-C  Any Other Special Instructions Will Be Listed Below (If Applicable).  COVID-19 Vaccine Information can be found at: [email or web address] For questions related to vaccine distribution or appointments, please email [email or web address] or call [phone].

## 2020-11-20 DIAGNOSIS — N171 Acute kidney failure with acute cortical necrosis: Secondary | ICD-10-CM | POA: Diagnosis not present

## 2020-11-20 DIAGNOSIS — N183 Chronic kidney disease, stage 3 unspecified: Secondary | ICD-10-CM | POA: Diagnosis not present

## 2020-11-20 DIAGNOSIS — G459 Transient cerebral ischemic attack, unspecified: Secondary | ICD-10-CM | POA: Diagnosis not present

## 2020-11-20 DIAGNOSIS — R531 Weakness: Secondary | ICD-10-CM | POA: Diagnosis not present

## 2020-11-25 DIAGNOSIS — M6281 Muscle weakness (generalized): Secondary | ICD-10-CM | POA: Diagnosis not present

## 2020-11-25 DIAGNOSIS — U071 COVID-19: Secondary | ICD-10-CM | POA: Diagnosis not present

## 2020-11-28 ENCOUNTER — Inpatient Hospital Stay
Admission: EM | Admit: 2020-11-28 | Discharge: 2020-12-03 | DRG: 682 | Disposition: A | Discharge status: Skilled Nursing Facility | Payer: Medicare Other | Attending: Internal Medicine | Admitting: Internal Medicine

## 2020-11-28 ENCOUNTER — Other Ambulatory Visit: Payer: Self-pay

## 2020-11-28 ENCOUNTER — Emergency Department: Payer: Medicare Other

## 2020-11-28 DIAGNOSIS — Z981 Arthrodesis status: Secondary | ICD-10-CM

## 2020-11-28 DIAGNOSIS — E785 Hyperlipidemia, unspecified: Secondary | ICD-10-CM | POA: Diagnosis present

## 2020-11-28 DIAGNOSIS — E118 Type 2 diabetes mellitus with unspecified complications: Secondary | ICD-10-CM | POA: Diagnosis present

## 2020-11-28 DIAGNOSIS — Z20822 Contact with and (suspected) exposure to covid-19: Secondary | ICD-10-CM | POA: Diagnosis present

## 2020-11-28 DIAGNOSIS — Z833 Family history of diabetes mellitus: Secondary | ICD-10-CM

## 2020-11-28 DIAGNOSIS — Z8673 Personal history of transient ischemic attack (TIA), and cerebral infarction without residual deficits: Secondary | ICD-10-CM

## 2020-11-28 DIAGNOSIS — I10 Essential (primary) hypertension: Secondary | ICD-10-CM

## 2020-11-28 DIAGNOSIS — N179 Acute kidney failure, unspecified: Secondary | ICD-10-CM | POA: Diagnosis not present

## 2020-11-28 DIAGNOSIS — Z8261 Family history of arthritis: Secondary | ICD-10-CM

## 2020-11-28 DIAGNOSIS — R519 Headache, unspecified: Secondary | ICD-10-CM | POA: Diagnosis present

## 2020-11-28 DIAGNOSIS — Z888 Allergy status to other drugs, medicaments and biological substances status: Secondary | ICD-10-CM

## 2020-11-28 DIAGNOSIS — E1122 Type 2 diabetes mellitus with diabetic chronic kidney disease: Secondary | ICD-10-CM | POA: Diagnosis present

## 2020-11-28 DIAGNOSIS — N1831 Chronic kidney disease, stage 3a: Secondary | ICD-10-CM | POA: Diagnosis not present

## 2020-11-28 DIAGNOSIS — Z90710 Acquired absence of both cervix and uterus: Secondary | ICD-10-CM

## 2020-11-28 DIAGNOSIS — Z7902 Long term (current) use of antithrombotics/antiplatelets: Secondary | ICD-10-CM

## 2020-11-28 DIAGNOSIS — K219 Gastro-esophageal reflux disease without esophagitis: Secondary | ICD-10-CM | POA: Diagnosis present

## 2020-11-28 DIAGNOSIS — Z83438 Family history of other disorder of lipoprotein metabolism and other lipidemia: Secondary | ICD-10-CM

## 2020-11-28 DIAGNOSIS — R0789 Other chest pain: Secondary | ICD-10-CM | POA: Diagnosis not present

## 2020-11-28 DIAGNOSIS — Z885 Allergy status to narcotic agent status: Secondary | ICD-10-CM

## 2020-11-28 DIAGNOSIS — Z743 Need for continuous supervision: Secondary | ICD-10-CM | POA: Diagnosis not present

## 2020-11-28 DIAGNOSIS — R4789 Other speech disturbances: Secondary | ICD-10-CM

## 2020-11-28 DIAGNOSIS — G9341 Metabolic encephalopathy: Secondary | ICD-10-CM | POA: Diagnosis not present

## 2020-11-28 DIAGNOSIS — Z6841 Body Mass Index (BMI) 40.0 and over, adult: Secondary | ICD-10-CM

## 2020-11-28 DIAGNOSIS — I129 Hypertensive chronic kidney disease with stage 1 through stage 4 chronic kidney disease, or unspecified chronic kidney disease: Secondary | ICD-10-CM | POA: Diagnosis not present

## 2020-11-28 DIAGNOSIS — Z7982 Long term (current) use of aspirin: Secondary | ICD-10-CM

## 2020-11-28 DIAGNOSIS — Z823 Family history of stroke: Secondary | ICD-10-CM

## 2020-11-28 DIAGNOSIS — G4489 Other headache syndrome: Secondary | ICD-10-CM | POA: Diagnosis not present

## 2020-11-28 DIAGNOSIS — E1169 Type 2 diabetes mellitus with other specified complication: Secondary | ICD-10-CM | POA: Diagnosis present

## 2020-11-28 DIAGNOSIS — Z8249 Family history of ischemic heart disease and other diseases of the circulatory system: Secondary | ICD-10-CM

## 2020-11-28 DIAGNOSIS — Z79899 Other long term (current) drug therapy: Secondary | ICD-10-CM

## 2020-11-28 DIAGNOSIS — G40909 Epilepsy, unspecified, not intractable, without status epilepticus: Secondary | ICD-10-CM | POA: Diagnosis present

## 2020-11-28 DIAGNOSIS — Z794 Long term (current) use of insulin: Secondary | ICD-10-CM

## 2020-11-28 DIAGNOSIS — I152 Hypertension secondary to endocrine disorders: Secondary | ICD-10-CM | POA: Diagnosis present

## 2020-11-28 DIAGNOSIS — E1142 Type 2 diabetes mellitus with diabetic polyneuropathy: Secondary | ICD-10-CM | POA: Diagnosis present

## 2020-11-28 DIAGNOSIS — R110 Nausea: Secondary | ICD-10-CM | POA: Diagnosis present

## 2020-11-28 DIAGNOSIS — Z9049 Acquired absence of other specified parts of digestive tract: Secondary | ICD-10-CM

## 2020-11-28 DIAGNOSIS — Z881 Allergy status to other antibiotic agents status: Secondary | ICD-10-CM

## 2020-11-28 DIAGNOSIS — Z66 Do not resuscitate: Secondary | ICD-10-CM | POA: Diagnosis present

## 2020-11-28 DIAGNOSIS — Z23 Encounter for immunization: Secondary | ICD-10-CM

## 2020-11-28 DIAGNOSIS — R531 Weakness: Secondary | ICD-10-CM

## 2020-11-28 DIAGNOSIS — I69354 Hemiplegia and hemiparesis following cerebral infarction affecting left non-dominant side: Secondary | ICD-10-CM

## 2020-11-28 DIAGNOSIS — I160 Hypertensive urgency: Secondary | ICD-10-CM | POA: Diagnosis present

## 2020-11-28 DIAGNOSIS — Z818 Family history of other mental and behavioral disorders: Secondary | ICD-10-CM

## 2020-11-28 DIAGNOSIS — N170 Acute kidney failure with tubular necrosis: Principal | ICD-10-CM | POA: Diagnosis present

## 2020-11-28 DIAGNOSIS — R413 Other amnesia: Secondary | ICD-10-CM | POA: Diagnosis present

## 2020-11-28 DIAGNOSIS — E538 Deficiency of other specified B group vitamins: Secondary | ICD-10-CM | POA: Diagnosis present

## 2020-11-28 DIAGNOSIS — Z7984 Long term (current) use of oral hypoglycemic drugs: Secondary | ICD-10-CM

## 2020-11-28 LAB — CBC WITH DIFFERENTIAL/PLATELET
Abs Immature Granulocytes: 0.04 10*3/uL (ref 0.00–0.07)
Basophils Absolute: 0.1 10*3/uL (ref 0.0–0.1)
Basophils Relative: 1 %
Eosinophils Absolute: 0.4 10*3/uL (ref 0.0–0.5)
Eosinophils Relative: 6 %
HCT: 31 % — ABNORMAL LOW (ref 36.0–46.0)
Hemoglobin: 10 g/dL — ABNORMAL LOW (ref 12.0–15.0)
Immature Granulocytes: 1 %
Lymphocytes Relative: 23 %
Lymphs Abs: 1.7 10*3/uL (ref 0.7–4.0)
MCH: 28.7 pg (ref 26.0–34.0)
MCHC: 32.3 g/dL (ref 30.0–36.0)
MCV: 88.8 fL (ref 80.0–100.0)
Monocytes Absolute: 0.6 10*3/uL (ref 0.1–1.0)
Monocytes Relative: 9 %
Neutro Abs: 4.4 10*3/uL (ref 1.7–7.7)
Neutrophils Relative %: 60 %
Platelets: 268 10*3/uL (ref 150–400)
RBC: 3.49 MIL/uL — ABNORMAL LOW (ref 3.87–5.11)
RDW: 13.6 % (ref 11.5–15.5)
WBC: 7.3 10*3/uL (ref 4.0–10.5)
nRBC: 0 % (ref 0.0–0.2)

## 2020-11-28 LAB — CBG MONITORING, ED: Glucose-Capillary: 169 mg/dL — ABNORMAL HIGH (ref 70–99)

## 2020-11-28 MED ORDER — SODIUM CHLORIDE 0.9 % IV BOLUS (SEPSIS)
500.0000 mL | Freq: Once | INTRAVENOUS | Status: AC
  Administered 2020-11-28: 500 mL via INTRAVENOUS

## 2020-11-28 MED ORDER — PROCHLORPERAZINE EDISYLATE 10 MG/2ML IJ SOLN
5.0000 mg | Freq: Once | INTRAMUSCULAR | Status: AC
  Administered 2020-11-28: 5 mg via INTRAVENOUS
  Filled 2020-11-28: qty 2
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
## 2020-11-28 NOTE — ED Triage Notes (Incomplete)
EMS brought in from home for headache since 11AM today. Has been taking Tylenol with no improvement in pain. Denies dizziness/head injury. Currently on Plavix. Hx CVA with residual weakness to left side. Nausea.

## 2020-11-28 NOTE — ED Notes (Signed)
Patient taken to CT.

## 2020-11-29 ENCOUNTER — Encounter: Payer: Self-pay | Admitting: Internal Medicine

## 2020-11-29 ENCOUNTER — Observation Stay: Payer: Medicare Other

## 2020-11-29 DIAGNOSIS — E1142 Type 2 diabetes mellitus with diabetic polyneuropathy: Secondary | ICD-10-CM

## 2020-11-29 DIAGNOSIS — I160 Hypertensive urgency: Secondary | ICD-10-CM

## 2020-11-29 DIAGNOSIS — E1169 Type 2 diabetes mellitus with other specified complication: Secondary | ICD-10-CM

## 2020-11-29 DIAGNOSIS — N189 Chronic kidney disease, unspecified: Secondary | ICD-10-CM

## 2020-11-29 DIAGNOSIS — R531 Weakness: Secondary | ICD-10-CM

## 2020-11-29 DIAGNOSIS — N179 Acute kidney failure, unspecified: Secondary | ICD-10-CM

## 2020-11-29 DIAGNOSIS — Z8673 Personal history of transient ischemic attack (TIA), and cerebral infarction without residual deficits: Secondary | ICD-10-CM

## 2020-11-29 DIAGNOSIS — R519 Headache, unspecified: Secondary | ICD-10-CM | POA: Diagnosis not present

## 2020-11-29 DIAGNOSIS — E785 Hyperlipidemia, unspecified: Secondary | ICD-10-CM | POA: Diagnosis not present

## 2020-11-29 DIAGNOSIS — R4789 Other speech disturbances: Secondary | ICD-10-CM | POA: Diagnosis not present

## 2020-11-29 DIAGNOSIS — I10 Essential (primary) hypertension: Secondary | ICD-10-CM

## 2020-11-29 LAB — BASIC METABOLIC PANEL
Anion gap: 12 (ref 5–15)
Anion gap: 9 (ref 5–15)
BUN: 49 mg/dL — ABNORMAL HIGH (ref 8–23)
BUN: 54 mg/dL — ABNORMAL HIGH (ref 8–23)
CO2: 19 mmol/L — ABNORMAL LOW (ref 22–32)
CO2: 20 mmol/L — ABNORMAL LOW (ref 22–32)
Calcium: 7.9 mg/dL — ABNORMAL LOW (ref 8.9–10.3)
Calcium: 8.2 mg/dL — ABNORMAL LOW (ref 8.9–10.3)
Chloride: 110 mmol/L (ref 98–111)
Chloride: 112 mmol/L — ABNORMAL HIGH (ref 98–111)
Creatinine, Ser: 2.63 mg/dL — ABNORMAL HIGH (ref 0.44–1.00)
Creatinine, Ser: 2.74 mg/dL — ABNORMAL HIGH (ref 0.44–1.00)
GFR, Estimated: 19 mL/min — ABNORMAL LOW (ref >60)
GFR, Estimated: 20 mL/min — ABNORMAL LOW (ref >60)
Glucose, Bld: 161 mg/dL — ABNORMAL HIGH (ref 70–99)
Glucose, Bld: 198 mg/dL — ABNORMAL HIGH (ref 70–99)
Potassium: 3.6 mmol/L (ref 3.5–5.1)
Potassium: 4.3 mmol/L (ref 3.5–5.1)
Sodium: 141 mmol/L (ref 135–145)
Sodium: 141 mmol/L (ref 135–145)

## 2020-11-29 LAB — HEMOGLOBIN A1C
Hgb A1c MFr Bld: 8.6 % — ABNORMAL HIGH (ref 4.8–5.6)
Mean Plasma Glucose: 200.12 mg/dL

## 2020-11-29 LAB — GLUCOSE, CAPILLARY
Glucose-Capillary: 178 mg/dL — ABNORMAL HIGH (ref 70–99)
Glucose-Capillary: 125 mg/dL — ABNORMAL HIGH (ref 70–99)

## 2020-11-29 LAB — RESP PANEL BY RT-PCR (FLU A&B, COVID) ARPGX2
Influenza A by PCR: NEGATIVE
Influenza B by PCR: NEGATIVE
SARS Coronavirus 2 by RT PCR: NEGATIVE

## 2020-11-29 LAB — HIV ANTIBODY (ROUTINE TESTING W REFLEX): HIV Screen 4th Generation wRfx: NONREACTIVE

## 2020-11-29 LAB — CBG MONITORING, ED
Glucose-Capillary: 132 mg/dL — ABNORMAL HIGH (ref 70–99)
Glucose-Capillary: 118 mg/dL — ABNORMAL HIGH (ref 70–99)

## 2020-11-29 MED ORDER — ENALAPRIL MALEATE 20 MG PO TABS
20.0000 mg | ORAL_TABLET | Freq: Two times a day (BID) | ORAL | Status: DC
  Administered 2020-11-29 – 2020-12-01 (×5): 20 mg via ORAL
  Filled 2020-11-29: qty 1
  Filled 2020-11-29: qty 2
  Filled 2020-11-29 (×4): qty 1

## 2020-11-29 MED ORDER — LEVETIRACETAM 500 MG PO TABS
500.0000 mg | ORAL_TABLET | Freq: Two times a day (BID) | ORAL | Status: DC
  Administered 2020-11-29 – 2020-12-03 (×9): 500 mg via ORAL
  Filled 2020-11-29 (×10): qty 1

## 2020-11-29 MED ORDER — SODIUM CHLORIDE 0.9 % IV SOLN: INTRAVENOUS | Status: DC

## 2020-11-29 MED ORDER — GABAPENTIN 100 MG PO CAPS
200.0000 mg | ORAL_CAPSULE | Freq: Two times a day (BID) | ORAL | Status: DC
  Administered 2020-11-29 – 2020-12-03 (×9): 200 mg via ORAL
  Filled 2020-11-29 (×9): qty 2

## 2020-11-29 MED ORDER — ACETAMINOPHEN 650 MG RE SUPP: 650.0000 mg | Freq: Four times a day (QID) | RECTAL | Status: DC | PRN

## 2020-11-29 MED ORDER — ENOXAPARIN SODIUM 40 MG/0.4ML IJ SOSY
40.0000 mg | PREFILLED_SYRINGE | INTRAMUSCULAR | Status: DC
  Administered 2020-11-29 – 2020-12-03 (×5): 40 mg via SUBCUTANEOUS
  Filled 2020-11-29 (×5): qty 0.4

## 2020-11-29 MED ORDER — FENTANYL CITRATE (PF) 100 MCG/2ML IJ SOLN
50.0000 ug | Freq: Once | INTRAMUSCULAR | Status: AC
Start: 2020-11-29 — End: 2020-11-29
  Administered 2020-11-29: 50 ug via INTRAVENOUS
  Filled 2020-11-29: qty 2

## 2020-11-29 MED ORDER — HYDRALAZINE HCL 20 MG/ML IJ SOLN
5.0000 mg | Freq: Once | INTRAMUSCULAR | Status: AC
  Administered 2020-11-29: 5 mg via INTRAVENOUS
  Filled 2020-11-29: qty 1

## 2020-11-29 MED ORDER — CLOPIDOGREL BISULFATE 75 MG PO TABS
75.0000 mg | ORAL_TABLET | Freq: Every day | ORAL | Status: DC
  Administered 2020-11-29 – 2020-12-03 (×5): 75 mg via ORAL
  Filled 2020-11-29 (×5): qty 1

## 2020-11-29 MED ORDER — ONDANSETRON HCL 4 MG PO TABS: 4.0000 mg | ORAL_TABLET | Freq: Four times a day (QID) | ORAL | Status: DC | PRN

## 2020-11-29 MED ORDER — METOPROLOL SUCCINATE ER 50 MG PO TB24
100.0000 mg | ORAL_TABLET | Freq: Every day | ORAL | Status: DC
  Administered 2020-11-29 – 2020-12-03 (×5): 100 mg via ORAL
  Filled 2020-11-29 (×5): qty 2

## 2020-11-29 MED ORDER — PANTOPRAZOLE SODIUM 40 MG PO TBEC
40.0000 mg | DELAYED_RELEASE_TABLET | Freq: Every day | ORAL | Status: DC
  Administered 2020-11-29 – 2020-12-03 (×5): 40 mg via ORAL
  Filled 2020-11-29 (×5): qty 1

## 2020-11-29 MED ORDER — ONDANSETRON HCL 4 MG/2ML IJ SOLN
4.0000 mg | Freq: Once | INTRAMUSCULAR | Status: AC
  Administered 2020-11-29: 4 mg via INTRAVENOUS
  Filled 2020-11-29: qty 2

## 2020-11-29 MED ORDER — INSULIN ASPART 100 UNIT/ML IJ SOLN
0.0000 [IU] | Freq: Every day | INTRAMUSCULAR | Status: DC
  Administered 2020-11-30: 2 [IU] via SUBCUTANEOUS
  Filled 2020-11-29: qty 1

## 2020-11-29 MED ORDER — HYDRALAZINE HCL 50 MG PO TABS
50.0000 mg | ORAL_TABLET | Freq: Three times a day (TID) | ORAL | Status: DC
  Administered 2020-11-29 – 2020-11-30 (×3): 50 mg via ORAL
  Filled 2020-11-29 (×4): qty 1

## 2020-11-29 MED ORDER — CLONIDINE HCL 0.1 MG PO TABS
0.3000 mg | ORAL_TABLET | Freq: Two times a day (BID) | ORAL | Status: DC
  Administered 2020-11-29 – 2020-12-03 (×8): 0.3 mg via ORAL
  Filled 2020-11-29 (×8): qty 3

## 2020-11-29 MED ORDER — ATORVASTATIN CALCIUM 20 MG PO TABS
20.0000 mg | ORAL_TABLET | Freq: Every day | ORAL | Status: DC
  Administered 2020-11-29 – 2020-12-03 (×5): 20 mg via ORAL
  Filled 2020-11-29 (×5): qty 1

## 2020-11-29 MED ORDER — INSULIN ASPART 100 UNIT/ML IJ SOLN
0.0000 [IU] | Freq: Three times a day (TID) | INTRAMUSCULAR | Status: DC
  Administered 2020-11-29: 3 [IU] via SUBCUTANEOUS
  Administered 2020-11-29: 4 [IU] via SUBCUTANEOUS
  Administered 2020-11-30: 13:00:00 7 [IU] via SUBCUTANEOUS
  Administered 2020-11-30: 4 [IU] via SUBCUTANEOUS
  Administered 2020-11-30 – 2020-12-01 (×2): 7 [IU] via SUBCUTANEOUS
  Administered 2020-12-01: 15 [IU] via SUBCUTANEOUS
  Administered 2020-12-01: 09:00:00 7 [IU] via SUBCUTANEOUS
  Administered 2020-12-02: 09:00:00 3 [IU] via SUBCUTANEOUS
  Administered 2020-12-02: 17:00:00 7 [IU] via SUBCUTANEOUS
  Administered 2020-12-02: 13:00:00 11 [IU] via SUBCUTANEOUS
  Administered 2020-12-03: 4 [IU] via SUBCUTANEOUS
  Administered 2020-12-03: 13:00:00 15 [IU] via SUBCUTANEOUS
  Filled 2020-11-29 (×13): qty 1

## 2020-11-29 MED ORDER — PROCHLORPERAZINE EDISYLATE 10 MG/2ML IJ SOLN: 5.0000 mg | Freq: Once | INTRAMUSCULAR | Status: DC

## 2020-11-29 MED ORDER — ONDANSETRON HCL 4 MG/2ML IJ SOLN: 4.0000 mg | Freq: Four times a day (QID) | INTRAMUSCULAR | Status: DC | PRN

## 2020-11-29 MED ORDER — BUTALBITAL-APAP-CAFFEINE 50-325-40 MG PO TABS: 1.0000 | ORAL_TABLET | ORAL | Status: DC | PRN

## 2020-11-29 MED ORDER — ENOXAPARIN SODIUM 60 MG/0.6ML IJ SOSY: 0.5000 mg/kg | PREFILLED_SYRINGE | INTRAMUSCULAR | Status: DC

## 2020-11-29 MED ORDER — ACETAMINOPHEN 325 MG PO TABS: 650.0000 mg | ORAL_TABLET | Freq: Four times a day (QID) | ORAL | Status: DC | PRN

## 2020-11-29 MED ORDER — ASPIRIN 81 MG PO CHEW
81.0000 mg | CHEWABLE_TABLET | Freq: Every day | ORAL | Status: DC
  Administered 2020-11-29 – 2020-12-03 (×5): 81 mg via ORAL
  Filled 2020-11-29 (×5): qty 1

## 2020-11-29 MED ORDER — AMLODIPINE BESYLATE 10 MG PO TABS
10.0000 mg | ORAL_TABLET | Freq: Every day | ORAL | Status: DC
  Administered 2020-11-29 – 2020-12-03 (×5): 10 mg via ORAL
  Filled 2020-11-29: qty 2
  Filled 2020-11-29 (×4): qty 1

## 2020-11-29 NOTE — ED Notes (Addendum)
Brief changed, peri-care performed, pt moved to hospital bed.

## 2020-11-29 NOTE — ED Notes (Signed)
Meal tray provided.

## 2020-11-29 NOTE — Progress Notes (Signed)
Order requisition for chaplain to visit patient. Patient says she is hopeful and waiting for doctor's reports to give her an idea of when she can go home. She says its has been a long day today. Husband was bedside, they were appreciative of visit, sharing his son is a Environmental education officer. I offered prayer prior to leaving. Told them a chaplain is always available if and when needed.

## 2020-11-29 NOTE — Evaluation (Signed)
Physical Therapy Evaluation Patient Details Name: Susan House MRN: [id number] DOB: 1956-06-07 Today's Date: 11/29/2020   History of Present Illness  Pt is a 65 y.o. female with medical history significant for DM, HTN, CVA with residual left hemiplegia, CKD 3a, morbid obesity who presents to the ER with a 1 day history of throbbing headache in the frontal area associated with nausea.  MD assessment includes: Intractable headache with CT head showing no evidence of acute intracranial hemorrhage or infarct, hypertensive urgency, and AKI on CKD III.    Clinical Impression  Pt was pleasant and motivated to participate during the session and put forth good effort throughout.  Pt required extensive physical assistance with all functional tasks, however, as well as instability in both sitting and standing.  Pt was able to come to standing with heavy +2 assist with training provided on standing both with the RW as well as with +2 HHA with LUE forearm support.  Once in standing the pt was unable to advance either LE during attempts at ambulation.  Pt presents with decline in functional status from her stated baseline and would not be safe to return to her prior living situation at this time.  Pt will benefit from PT services in a SNF setting upon discharge to safely address deficits listed in patient problem list for decreased caregiver assistance and eventual return to PLOF.      Follow Up Recommendations SNF;Supervision/Assistance - 24 hour    Equipment Recommendations  None recommended by PT    Recommendations for Other Services       Precautions / Restrictions Precautions Precautions: Fall Restrictions Weight Bearing Restrictions: No      Mobility  Bed Mobility Overal bed mobility: Needs Assistance Bed Mobility: Supine to Sit;Sit to Supine     Supine to sit: Max assist Sit to supine: Max assist;+2 for physical assistance   General bed mobility comments: +2 Max A for BLE and  trunk control    Transfers Overall transfer level: Needs assistance Equipment used: Rolling walker (2 wheeled);2 person hand held assist Transfers: Sit to/from Stand Sit to Stand: Max assist;+2 physical assistance         General transfer comment: Pt required extensive cuing for increased trunk flexion during transfer attempts; transfer training with RW and then with +2 HHA with LUE forearm support with equal assist required for both methods  Ambulation/Gait             General Gait Details: Unable  Stairs            Wheelchair Mobility    Modified Rankin (Stroke Patients Only)       Balance Overall balance assessment: Needs assistance Sitting-balance support: Feet supported;Single extremity supported Sitting balance-Leahy Scale: Fair Sitting balance - Comments: Min lean to the R   Standing balance support: Bilateral upper extremity supported;During functional activity Standing balance-Leahy Scale: Poor Standing balance comment: Heavy +2 assist for stability in standing                             Pertinent Vitals/Pain Pain Assessment: No/denies pain    Home Living Family/patient expects to be discharged to:: Private residence Living Arrangements: Spouse/significant other Available Help at Discharge: Family;Available 24 hours/day Type of Home: House Home Access: Ramped entrance     Home Layout: One level Home Equipment: Bedside commode;Cane - single point;Shower seat - built in;Walker - 4 wheels;Walker - 2 wheels;Wheelchair - power  Prior Function Level of Independence: Needs assistance   Gait / Transfers Assistance Needed: Min A with transfers and able to take several small steps with assist and with a RW with LUE platform, no fall history in last 6 months, w/c with ramp for home access  ADL's / Homemaking Assistance Needed: Assist with ADLs from dtr and sister        Hand Dominance   Dominant Hand: Right    Extremity/Trunk  Assessment   Upper Extremity Assessment Upper Extremity Assessment: Generalized weakness;LUE deficits/detail LUE Deficits / Details: Chronic LUE weakness from CVA    Lower Extremity Assessment Lower Extremity Assessment: Generalized weakness;LLE deficits/detail LLE Deficits / Details: Chronic LLE weakness from CVA       Communication   Communication: No difficulties  Cognition Arousal/Alertness: Awake/alert Behavior During Therapy: WFL for tasks assessed/performed Overall Cognitive Status: Within Functional Limits for tasks assessed                                        General Comments      Exercises Total Joint Exercises Ankle Circles/Pumps: AROM;Strengthening;Both;10 reps Quad Sets: Strengthening;Both;10 reps Gluteal Sets: Strengthening;Both;10 reps Heel Slides: AAROM;Strengthening;Both;5 reps Hip ABduction/ADduction: AAROM;Strengthening;Both;10 reps Straight Leg Raises: AAROM;Strengthening;Both;10 reps Long Arc Quad: AROM;Strengthening;Both;5 reps Other Exercises: static sitting at EOB for core therex and increased activity tolerance Other Exercises: HEP education for BLE APs, QS, and GS x 10 each every 1-2 hours daily Multiple sit to/from stand transfer training with RW as well as with +2 HHA with cues for sequencing Anterior weight shifting in sitting to facilitate improved transfers   Assessment/Plan    PT Assessment Patient needs continued PT services  PT Problem List Decreased strength;Decreased activity tolerance;Decreased balance;Decreased mobility;Decreased knowledge of use of DME       PT Treatment Interventions DME instruction;Gait training;Functional mobility training;Therapeutic activities;Therapeutic exercise;Balance training;Patient/family education    PT Goals (Current goals can be found in the Care Plan section)  Acute Rehab PT Goals Patient Stated Goal: To get stronger PT Goal Formulation: With patient Time For Goal Achievement:  12/12/20 Potential to Achieve Goals: Fair    Frequency Min 2X/week   Barriers to discharge Inaccessible home environment      Co-evaluation               AM-PAC PT "6 Clicks" Mobility  Outcome Measure Help needed turning from your back to your side while in a flat bed without using bedrails?: Total Help needed moving from lying on your back to sitting on the side of a flat bed without using bedrails?: Total Help needed moving to and from a bed to a chair (including a wheelchair)?: Total Help needed standing up from a chair using your arms (e.g., wheelchair or bedside chair)?: Total Help needed to walk in hospital room?: Total Help needed climbing 3-5 steps with a railing? : Total 6 Click Score: 6    End of Session Equipment Utilized During Treatment: Gait belt Activity Tolerance: Patient tolerated treatment well Patient left: in bed;with call bell/phone within reach;with bed alarm set;with family/visitor present Nurse Communication: Mobility status PT Visit Diagnosis: Unsteadiness on feet (R26.81);Difficulty in walking, not elsewhere classified (R26.2);Muscle weakness (generalized) (M62.81)    Time: 1937-9024 PT Time Calculation (min) (ACUTE ONLY): 38 min   Charges:   PT Evaluation $PT Eval Moderate Complexity: 1 Mod PT Treatments $Therapeutic Exercise: 8-22 mins $Therapeutic Activity: 8-22 mins  Linus Salmons PT, DPT 11/29/20, 4:43 PM

## 2020-11-29 NOTE — Progress Notes (Signed)
VAST consult received at 2020 to obtain new IV access because patient keeps bending her arm where her current IV access is located. Called unit and advised nurse that IV team is not available at this time and to request her co-workers help her obtain IV access. Advised if they were unsuccessful to call [facility] for assistance. VAST RN then called [facility] and informed of issue.

## 2020-11-29 NOTE — Plan of Care (Signed)
  Problem: Education: Goal: Knowledge of General Education information will improve Description: Including pain rating scale, medication(s)/side effects and non-pharmacologic comfort measures Outcome: Progressing   Problem: Activity: Goal: Risk for activity intolerance will decrease Outcome: Progressing   Problem: Nutrition: Goal: Adequate nutrition will be maintained Outcome: Progressing   Problem: Coping: Goal: Level of anxiety will decrease Outcome: Progressing   Problem: Pain Managment: Goal: General experience of comfort will improve Outcome: Progressing   Problem: Safety: Goal: Ability to remain free from injury will improve Outcome: Progressing     Problem: Clinical Measurements: Goal: Ability to maintain clinical measurements within normal limits will improve Outcome: Progressing Goal: Will remain free from infection Outcome: Progressing Goal: Diagnostic test results will improve Outcome: Progressing Goal: Respiratory complications will improve Outcome: Progressing Goal: Cardiovascular complication will be avoided Outcome: Progressing

## 2020-11-29 NOTE — Progress Notes (Signed)
Patient ID: Susan House, female   DOB: 1956-07-06, 65 y.o.   MRN: [id number] [facility] PROGRESS NOTE  Susan House TUU:[id number] DOB: Nov 18, 1955 DOA: 11/28/2020 PCP: Jerrol Banana., MD  HPI/Subjective: Patient initially came in with headache.  She states her headache is better and was asking to go home.  Husband states that she is very weak and having problems even getting out of bed over the last few days.  History of stroke.  States she is eating and drinking okay.  Admitted secondary to headache and elevated blood pressure  Objective: Vitals:   11/29/20 1330 11/29/20 1404  BP: (!) 179/91 (!) 130/106  Pulse: 77 73  Resp:  20  Temp:  98.2 F (36.8 C)  SpO2: 100% 100%    Intake/Output Summary (Last 24 hours) at 11/29/2020 1636 Last data filed at 11/29/2020 0038 Gross per 24 hour  Intake 500 ml  Output --  Net 500 ml   Filed Weights   11/28/20 2237  Weight: 104.8 kg    ROS: Review of Systems  Respiratory: Negative for shortness of breath.   Cardiovascular: Negative for chest pain.  Gastrointestinal: Negative for abdominal pain.  Musculoskeletal: Negative for joint pain.  Neurological: Negative for headaches.   Exam: Physical Exam HENT:     Head: Normocephalic.     Mouth/Throat:     Pharynx: No oropharyngeal exudate.  Eyes:     General: Lids are normal.     Conjunctiva/sclera: Conjunctivae normal.     Pupils: Pupils are equal, round, and reactive to light.  Cardiovascular:     Rate and Rhythm: Normal rate and regular rhythm.     Heart sounds: Normal heart sounds, S1 normal and S2 normal.  Pulmonary:     Breath sounds: No decreased breath sounds, wheezing, rhonchi or rales.  Abdominal:     Palpations: Abdomen is soft.     Tenderness: There is no abdominal tenderness.  Musculoskeletal:     Right lower leg: No swelling.     Left lower leg: No swelling.  Skin:    General: Skin is warm.     Findings: No rash.  Neurological:      Mental Status: She is alert and oriented to person, place, and time.     Comments: Chronic left-sided weakness.       Data Reviewed: Basic Metabolic Panel: Recent Labs  Lab 11/28/20 2341 11/29/20 1015  NA 141 141  K 4.3 3.6  CL 110 112*  CO2 19* 20*  GLUCOSE 198* 161*  BUN 54* 49*  CREATININE 2.74* 2.63*  CALCIUM 8.2* 7.9*   CBC: Recent Labs  Lab 11/28/20 2341  WBC 7.3  NEUTROABS 4.4  HGB 10.0*  HCT 31.0*  MCV 88.8  PLT 268    CBG: Recent Labs  Lab 11/28/20 2335 11/29/20 0849 11/29/20 1104  GLUCAP 169* 132* 118*    Recent Results (from the past 240 hour(s))  Resp Panel by RT-PCR (Flu A&B, Covid) Nasopharyngeal Swab     Status: None   Collection Time: 11/29/20  2:20 AM   Specimen: Nasopharyngeal Swab; Nasopharyngeal(NP) swabs in vial transport medium  Result Value Ref Range Status   SARS Coronavirus 2 by RT PCR NEGATIVE NEGATIVE Final    Comment: (NOTE) SARS-CoV-2 target nucleic acids are NOT DETECTED.  The SARS-CoV-2 RNA is generally detectable in upper respiratory specimens during the acute phase of infection. The lowest concentration of SARS-CoV-2 viral copies this assay can detect is 138 copies/mL.  A negative result does not preclude SARS-Cov-2 infection and should not be used as the sole basis for treatment or other patient management decisions. A negative result may occur with  improper specimen collection/handling, submission of specimen other than nasopharyngeal swab, presence of viral mutation(s) within the areas targeted by this assay, and inadequate number of viral copies(<138 copies/mL). A negative result must be combined with clinical observations, patient history, and epidemiological information. The expected result is Negative.  Fact Sheet for Patients:  EntrepreneurPulse.com.au  Fact Sheet for Healthcare Providers:  IncredibleEmployment.be  This test is no t yet approved or cleared by the [place] FDA and  has been authorized for detection and/or diagnosis of SARS-CoV-2 by FDA under an Emergency Use Authorization (EUA). This EUA will remain  in effect (meaning this test can be used) for the duration of the COVID-19 declaration under Section 564(b)(1) of the Act, 21 U.S.C.section 360bbb-3(b)(1), unless the authorization is terminated  or revoked sooner.       Influenza A by PCR NEGATIVE NEGATIVE Final   Influenza B by PCR NEGATIVE NEGATIVE Final    Comment: (NOTE) The Xpert Xpress SARS-CoV-2/FLU/RSV plus assay is intended as an aid in the diagnosis of influenza from Nasopharyngeal swab specimens and should not be used as a sole basis for treatment. Nasal washings and aspirates are unacceptable for Xpert Xpress SARS-CoV-2/FLU/RSV testing.  Fact Sheet for Patients: EntrepreneurPulse.com.au  Fact Sheet for Healthcare Providers: IncredibleEmployment.be  This test is not yet approved or cleared by the [place] FDA and has been authorized for detection and/or diagnosis of SARS-CoV-2 by FDA under an Emergency Use Authorization (EUA). This EUA will remain in effect (meaning this test can be used) for the duration of the COVID-19 declaration under Section 564(b)(1) of the Act, 21 U.S.C. section 360bbb-3(b)(1), unless the authorization is terminated or revoked.  Performed at [facility], [address] [place],  [address]      Studies: CT Head Wo Contrast  Result Date: 11/29/2020 CLINICAL DATA:  Headache EXAM: CT HEAD WITHOUT CONTRAST TECHNIQUE: Contiguous axial images were obtained from the base of the skull through the vertex without intravenous contrast. COMPARISON:  CT 05/04/2020, MRI 05/05/2020 FINDINGS: Brain: Moderate parenchymal volume loss is again identified, slightly advanced in relation to the patient's age, but stable since prior examination. Remote left periventricular white matter and lentiform  nuclear infarcts are again identified with ex vacuo dilation of the frontal horn of the left lateral ventricle. Remote infarct involving the posterior limb of the right internal capsule again noted. Mild periventricular white matter changes are present likely reflecting the sequela of small vessel ischemia. No evidence of acute intracranial hemorrhage or infarct. No abnormal mass effect or midline shift. No abnormal intra or extra-axial mass lesion or fluid collection. Borderline ventriculomegaly is stable, likely the sequela of central atrophy. The cerebellum is unremarkable. Asymmetric atrophy involving the cerebral peduncles and pons is again noted. Vascular: No asymmetric hyperdense vasculature at the skull base. Advanced vascular calcifications are noted within the carotid siphons and distal vertebral arteries bilaterally. Skull: Intact Sinuses/Orbits: Remote right medial orbital wall fracture again identified. The visualized orbits are otherwise unremarkable. The visualized paranasal sinuses are clear. Other: Mastoid air cells and middle ear cavities are clear. IMPRESSION: No evidence of acute intracranial hemorrhage or infarct. Stable remote infarcts and advanced senescent change. Electronically Signed   By: Fidela Salisbury MD   On: 11/29/2020 01:49    Scheduled Meds: . amLODipine  10 mg Oral Daily  .  aspirin  81 mg Oral Daily  . atorvastatin  20 mg Oral Daily  . clopidogrel  75 mg Oral Daily  . enalapril  20 mg Oral BID  . enoxaparin (LOVENOX) injection  40 mg Subcutaneous Q24H  . gabapentin  200 mg Oral BID  . insulin aspart  0-20 Units Subcutaneous TID WC  . insulin aspart  0-5 Units Subcutaneous QHS  . levETIRAcetam  500 mg Oral BID  . pantoprazole  40 mg Oral Daily   Continuous Infusions: . sodium chloride 75 mL/hr at 11/29/20 1259    Assessment/Plan:  1. Acute kidney injury on chronic kidney disease stage IIIa.  Continue IV fluid hydration.  Creatinine 2.74 on presentation and came  down to 2.63 with IV fluids.  Recheck tomorrow.  Baseline creatinine around 1.76. 2. Essential hypertension.  On Norvasc and enalapril.  Add hydralazine.  Restart Toprol and clonidine. 3. Headache has resolved 4. Weakness with history of stroke in the past.  Patient weaker than previous and physical therapy recommending rehab.  We will get a urinalysis and chest x-ray to rule out infection. 5. History of seizure on Keppra 6. Type 2 diabetes mellitus with hyperlipidemia on atorvastatin.  Last hemoglobin A1c 8.6.  Last blood sugar 118.  Continue to monitor closely. 7. Diabetic neuropathy on gabapentin 8. History of completed nonhemorrhagic CVA in the past with left-sided weakness on aspirin Plavix and atorvastatin 9. Obtain chest x-ray and urinalysis       Code Status:     Code Status Orders  (From admission, onward)         Start     Ordered   11/29/20 0924  Do not attempt resuscitation (DNR)  Continuous       Question Answer Comment  In the event of cardiac or respiratory ARREST Do not call a "code blue"   In the event of cardiac or respiratory ARREST Do not perform Intubation, CPR, defibrillation or ACLS   In the event of cardiac or respiratory ARREST Use medication by any route, position, wound care, and other measures to relive pain and suffering. May use oxygen, suction and manual treatment of airway obstruction as needed for comfort.   Comments nurse may pronounce      11/29/20 0923        Code Status History    Date Active Date Inactive Code Status Order ID Comments User Context   11/29/2020 0512 11/29/2020 0923 Partial Code [id number]  Athena Masse, MD ED   11/29/2020 0333 11/29/2020 0512 Full Code [id number]  Athena Masse, MD ED   04/27/2020 0325 05/09/2020 1820 Partial Code [id number]  Mansy, Arvella Merles, MD ED   12/23/2019 1705 12/25/2019 1927 Partial Code [id number]  Ivor Costa, MD ED   12/23/2019 1649 12/23/2019 1704 Full Code [id number]  Ivor Costa, MD ED   11/10/2019 0353  11/13/2019 2039 DNR [id number]  Sidney Ace, Arvella Merles, MD ED   09/25/2018 0921 09/26/2018 1948 DNR [id number]  Dustin Flock, MD ED   08/28/2018 2226 08/30/2018 1657 Full Code [id number]  [name], MD Inpatient   03/31/2018 2006 04/02/2018 1733 DNR [id number]  Sela Hua, MD Inpatient   10/03/2017 2208 10/04/2017 1718 Full Code [id number]  Dustin Flock, MD Inpatient   10/15/2016 2201 10/17/2016 1624 Full Code [id number]  Vaughan Basta, MD ED   07/08/2016 0437 07/11/2016 1610 Full Code [id number]  Saundra Shelling, MD ED   12/26/2015 1847 12/28/2015 2027 Full Code [id number]  Idelle Crouch, MD Inpatient   07/22/2015  8315 07/23/2015 1505 Full Code [id number]  Saundra Shelling, MD Inpatient   Advance Care Planning Activity    Advance Directive Documentation   Flowsheet Row Most Recent Value  Type of Advance Directive Healthcare Power of Attorney  Pre-existing out of facility DNR order (yellow form or pink MOST form) --  "MOST" Form in Place? --     Family Communication: Spoke with husband on the phone twice today Disposition Plan: Status is: Observation  Dispo: The patient is from: Home              Anticipated d/c is to: Rehab              Patient currently being treated for acute kidney injury with IV fluids, trying to get blood pressure better by restarting meds   Difficult to place patient.  No  Time spent: 35 minutes  [place]

## 2020-11-29 NOTE — H&P (Signed)
History and Physical    Susan House [id number] DOB: May 07, 1956 DOA: 11/28/2020  PCP: Jerrol Banana., MD   Patient coming from: Home  I have personally briefly reviewed patient's old medical records in [place]  Chief Complaint: Headache  HPI: Susan House is a 65 y.o. female with medical history significant for DM, HTN, CVA with residual left hemiplegia, CKD 3a, morbid obesity who presents to the ER with a 1 day history of throbbing headache in the frontal area associated with nausea.  Denies fever or neck pain or stiffness.  Denies visual disturbance, weakness beyond her baseline or numbness tingling or slurred speech.  Pain is of severe intensity, unrelieved with Tylenol at home.  Denies cough or shortness of breath or chest pain, denies abdominal pain, change in bowel habits or dysuria. ED course: On arrival, afebrile, BP 198/90, pulse 88 O2 sat 96% on room air.  During her course in the emergency room systolic blood pressure trended up to above 200 Blood work significant for creatinine of 2.74, up from baseline of 1.73.  Hemoglobin 10 which is better than baseline around 8.3. Imaging: CT head with no evidence of acute intracranial hemorrhage or infarct.  Stable remote infarcts and advanced senescent change  Patient was treated with Compazine and Zofran for headache and also given hydralazine IV for BP but continued to complain of headache 7 out of 10.  She was subsequently given IV fentanyl.  Hospitalist consulted for admission.  Review of Systems: As per HPI otherwise all other systems on review of systems negative.    Past Medical History:  Diagnosis Date  . Allergy   . Arthritis   . Atypical chest pain    a. Reportedly nl cath in the past; b. 12/2009 MV: No ischemia/infarct. EF 87%.  . Carotid arterial disease ([place])    a. 08/7251 CTA head/neck: LICA 66Y, RICA 40-->HKV rx.  . Cellulitis and abscess of face   . Chronic Dizziness   . Coronary  artery calcification seen on CT scan    a. 10/2016 CTA chest: Atherosclerotic calcifications Ao, cors, and prox great vessels.  . Depression   . Diabetes mellitus ([place])   . Diastolic dysfunction    a. 2008 Echo: Nl EF; b. 2011 Echo: Nl EF; c. 06/2016 Echo: EF 55-60%, Gr1DD; d. 03/2018 Echo: EF 55-60%, no rwma, Gr1DD, mod dil LA.  . Edema   . GERD (gastroesophageal reflux disease)   . Headache   . Hematuria   . Hyperlipidemia   . Hypertension   . IBS (irritable bowel syndrome)   . Migraine   . Morbid obesity ([place])   . Nocturia   . Possible Seizure ([place])    a. 10/2016 ? sz. MRI neg for stroke.  . Stroke ([place]) 2000  . TIA (transient ischemic attack) 2010 & 2019   a. 2010; b. 03/2018 Left sided wkns-->MRI neg for stroke. CTA head/neck: Right A2 cut off/occlusion w/ collats extending over R cerebral convexity. LICA 42V, RICA 45, RSubclavian 50->med rx.  . Urgency of micturation   . Urinary frequency   . Urinary incontinence     Past Surgical History:  Procedure Laterality Date  . ABDOMINAL HYSTERECTOMY    . ABDOMINOPLASTY    . APPENDECTOMY    . BACK SURGERY     extensive spinal fusion  . carpel tunnel surgery    . CATARACT EXTRACTION     x 2  . CESAREAN SECTION    .  CHOLECYSTECTOMY    . COLONOSCOPY  2010  . COLONOSCOPY WITH PROPOFOL N/A 07/11/2016   Procedure: COLONOSCOPY WITH PROPOFOL;  Surgeon: Lucilla Lame, MD;  Location: [facility] ENDOSCOPY;  Service: Endoscopy;  Laterality: N/A;  . COLONOSCOPY WITH PROPOFOL N/A 03/26/2018   Procedure: COLONOSCOPY WITH PROPOFOL;  Surgeon: Lucilla Lame, MD;  Location: [facility] ENDOSCOPY;  Service: Endoscopy;  Laterality: N/A;  . ESOPHAGOGASTRODUODENOSCOPY (EGD) WITH PROPOFOL N/A 03/26/2018   Procedure: ESOPHAGOGASTRODUODENOSCOPY (EGD) WITH PROPOFOL;  Surgeon: Lucilla Lame, MD;  Location: [facility] ENDOSCOPY;  Service: Endoscopy;  Laterality: N/A;  . EYE SURGERY    . HERNIA REPAIR    . SHOULDER SURGERY    . TEE WITHOUT CARDIOVERSION N/A 05/07/2020    Procedure: TRANSESOPHAGEAL ECHOCARDIOGRAM (TEE);  Surgeon: Nelva Bush, MD;  Location: [facility] ORS;  Service: Cardiovascular;  Laterality: N/A;  . TOE SURGERY    . UPPER GI ENDOSCOPY    . WRIST FRACTURE SURGERY Left      reports that she has never smoked. She has never used smokeless tobacco. She reports that she does not drink alcohol and does not use drugs.  Allergies  Allergen Reactions  . Morphine And Related Anaphylaxis  . Reglan [Metoclopramide]     Other reaction(s): Unknown Elevated BP  . Simvastatin Other (See Comments)    Muscle pain  . Betadine [Povidone Iodine] Rash  . Tetracyclines & Related Rash    Other reaction(s): Unknown    Family History  Problem Relation Age of Onset  . Hyperlipidemia Mother   . Arrhythmia Mother        WPW  . Hypertension Mother   . Rheum arthritis Mother   . Heart disease Mother   . Heart attack Father 81  . Hyperlipidemia Father   . Hypertension Father   . Stroke Father   . Diabetes Father   . Heart disease Father   . Coronary artery disease Father   . Diabetes Sister   . Hyperlipidemia Sister   . Hypertension Sister   . Depression Sister   . Depression Sister   . Diabetes Sister   . Hypertension Sister   . Hyperlipidemia Sister   . Kidney disease Paternal Grandmother       Prior to Admission medications   Medication Sig Start Date End Date Taking? Authorizing Provider  ACCU-CHEK AVIVA PLUS test strip TEST BLOOD SUGAR 3 TIMES  DAILY 06/07/20   Jerrol Banana., MD  amLODipine (NORVASC) 10 MG tablet TAKE 1 TABLET BY MOUTH  DAILY 11/09/20   Jerrol Banana., MD  aspirin 81 MG chewable tablet Chew 1 tablet (81 mg total) by mouth daily. 09/27/18   Dustin Flock, MD  atorvastatin (LIPITOR) 20 MG tablet TAKE 1 TABLET BY MOUTH  DAILY 08/06/20   Jerrol Banana., MD  cloNIDine (CATAPRES) 0.3 MG tablet Take 0.3 mg by mouth 2 (two) times daily. 09/14/20   [provider]  clopidogrel (PLAVIX) 75 MG tablet  TAKE 1 TABLET BY MOUTH  DAILY 07/19/20   Jerrol Banana., MD  Dimethicone (CAVILON DURABLE BARRIER) 1.3 % CREA Apply barrier cream PRN 08/19/20   Jerrol Banana., MD  enalapril (VASOTEC) 20 MG tablet TAKE 1 TABLET BY MOUTH  TWICE DAILY 10/12/20   Jerrol Banana., MD  famotidine (PEPCID) 20 MG tablet Take 1 tablet (20 mg total) by mouth daily. 05/08/20 06/07/20  Sharen Hones, MD  gabapentin (NEURONTIN) 100 MG capsule Take 200 mg by mouth 2 (two) times daily.  10/14/18   [provider]  hydrALAZINE (APRESOLINE) 100 MG tablet Take 1 tablet (100 mg total) by mouth 2 (two) times daily as needed (for pressure >145). 11/09/20   Minna Merritts, MD  insulin degludec (TRESIBA FLEXTOUCH) 100 UNIT/ML FlexTouch Pen INJECT UP TO 30 UNITS UNDER THE SKIN DAILY 08/09/20   Jerrol Banana., MD  LANTUS SOLOSTAR 100 UNIT/ML Solostar Pen Inject into the skin. 07/24/20   [provider]  levETIRAcetam (KEPPRA) 500 MG tablet Take 1 tablet (500 mg total) by mouth 2 (two) times daily. 10/01/20   Jerrol Banana., MD  metFORMIN (GLUCOPHAGE) 1000 MG tablet TAKE 1 TABLET BY MOUTH  TWICE DAILY 11/09/20   Jerrol Banana., MD  metoprolol succinate (TOPROL-XL) 100 MG 24 hr tablet TAKE 1 TABLET BY MOUTH  DAILY 11/09/20   Jerrol Banana., MD  nitroGLYCERIN (NITROSTAT) 0.4 MG SL tablet Place 1 tablet (0.4 mg total) under the tongue every 5 (five) minutes as needed for chest pain. 10/11/15   Jerrol Banana., MD  ondansetron (ZOFRAN) 4 MG tablet Take 1 tablet (4 mg total) by mouth every 8 (eight) hours as needed for nausea or vomiting. 06/09/19   Chrismon, Vickki Muff, PA-C  pantoprazole (PROTONIX) 40 MG tablet TAKE 1 TABLET BY MOUTH  DAILY 02/21/20   Jerrol Banana., MD    Physical Exam: Vitals:   11/29/20 0105 11/29/20 0130 11/29/20 0204 11/29/20 0230  BP: (!) 183/90 (!) 190/87 (!) 188/93 (!) 174/77  Pulse: 70 70  72  Resp: 16 16  16   Temp:      TempSrc:       SpO2: 100% 100%  100%  Weight:      Height:         Vitals:   11/29/20 0105 11/29/20 0130 11/29/20 0204 11/29/20 0230  BP: (!) 183/90 (!) 190/87 (!) 188/93 (!) 174/77  Pulse: 70 70  72  Resp: 16 16  16   Temp:      TempSrc:      SpO2: 100% 100%  100%  Weight:      Height:          Constitutional: Alert and oriented x 3 . Not in any apparent distress HEENT:      Head: Normocephalic and atraumatic.         Eyes: PERLA, EOMI, Conjunctivae are normal. Sclera is non-icteric.       Mouth/Throat: Mucous membranes are moist.       Neck: Supple with no signs of meningismus. Cardiovascular: Regular rate and rhythm. No murmurs, gallops, or rubs. 2+ symmetrical distal pulses are present . No JVD. No LE edema Respiratory: Respiratory effort normal .Lungs sounds clear bilaterally. No wheezes, crackles, or rhonchi.  Gastrointestinal: Soft, non tender, and non distended with positive bowel sounds.  Genitourinary: No CVA tenderness. Musculoskeletal: Nontender.  Decreased ROM left side. No cyanosis, or erythema of extremities. Neurologic:  Face is symmetric. Moving all extremities.  Old left-sided deficits skin: Skin is warm, dry.  No rash or ulcers Psychiatric: Mood and affect are normal    Labs on Admission: I have personally reviewed following labs and imaging studies  CBC: Recent Labs  Lab 11/28/20 2341  WBC 7.3  NEUTROABS 4.4  HGB 10.0*  HCT 31.0*  MCV 88.8  PLT 829   Basic Metabolic Panel: Recent Labs  Lab 11/28/20 2341  NA 141  K 4.3  CL 110  CO2 19*  GLUCOSE 198*  BUN 54*  CREATININE 2.74*  CALCIUM 8.2*   GFR: Estimated Creatinine Clearance: 23.6 mL/min (A) (by C-G formula based on SCr of 2.74 mg/dL (H)). Liver Function Tests: No results for input(s): AST, ALT, ALKPHOS, BILITOT, PROT, ALBUMIN in the last 168 hours. No results for input(s): LIPASE, AMYLASE in the last 168 hours. No results for input(s): AMMONIA in the last 168 hours. Coagulation Profile: No  results for input(s): INR, PROTIME in the last 168 hours. Cardiac Enzymes: No results for input(s): CKTOTAL, CKMB, CKMBINDEX, TROPONINI in the last 168 hours. BNP (last 3 results) No results for input(s): PROBNP in the last 8760 hours. HbA1C: No results for input(s): HGBA1C in the last 72 hours. CBG: Recent Labs  Lab 11/28/20 2335  GLUCAP 169*   Lipid Profile: No results for input(s): CHOL, HDL, LDLCALC, TRIG, CHOLHDL, LDLDIRECT in the last 72 hours. Thyroid Function Tests: No results for input(s): TSH, T4TOTAL, FREET4, T3FREE, THYROIDAB in the last 72 hours. Anemia Panel: No results for input(s): VITAMINB12, FOLATE, FERRITIN, TIBC, IRON, RETICCTPCT in the last 72 hours. Urine analysis:    Component Value Date/Time   COLORURINE YELLOW (A) 05/04/2020 1600   APPEARANCEUR CLOUDY (A) 05/04/2020 1600   APPEARANCEUR Turbid (A) 05/25/2016 1122   LABSPEC 1.009 05/04/2020 1600   LABSPEC 1.014 01/17/2014 1708   PHURINE 5.0 05/04/2020 1600   GLUCOSEU 50 (A) 05/04/2020 1600   GLUCOSEU >=500 01/17/2014 1708   HGBUR SMALL (A) 05/04/2020 1600   BILIRUBINUR NEGATIVE 05/04/2020 1600   BILIRUBINUR Negative 10/30/2019 1632   BILIRUBINUR Negative 05/25/2016 1122   BILIRUBINUR Negative 01/17/2014 1708   KETONESUR NEGATIVE 05/04/2020 1600   PROTEINUR >=300 (A) 05/04/2020 1600   UROBILINOGEN 0.2 10/30/2019 1632   UROBILINOGEN 0.2 01/15/2009 0431   NITRITE NEGATIVE 05/04/2020 1600   LEUKOCYTESUR LARGE (A) 05/04/2020 1600   LEUKOCYTESUR 2+ 01/17/2014 1708    Radiological Exams on Admission: CT Head Wo Contrast  Result Date: 11/29/2020 CLINICAL DATA:  Headache EXAM: CT HEAD WITHOUT CONTRAST TECHNIQUE: Contiguous axial images were obtained from the base of the skull through the vertex without intravenous contrast. COMPARISON:  CT 05/04/2020, MRI 05/05/2020 FINDINGS: Brain: Moderate parenchymal volume loss is again identified, slightly advanced in relation to the patient's age, but stable since  prior examination. Remote left periventricular white matter and lentiform nuclear infarcts are again identified with ex vacuo dilation of the frontal horn of the left lateral ventricle. Remote infarct involving the posterior limb of the right internal capsule again noted. Mild periventricular white matter changes are present likely reflecting the sequela of small vessel ischemia. No evidence of acute intracranial hemorrhage or infarct. No abnormal mass effect or midline shift. No abnormal intra or extra-axial mass lesion or fluid collection. Borderline ventriculomegaly is stable, likely the sequela of central atrophy. The cerebellum is unremarkable. Asymmetric atrophy involving the cerebral peduncles and pons is again noted. Vascular: No asymmetric hyperdense vasculature at the skull base. Advanced vascular calcifications are noted within the carotid siphons and distal vertebral arteries bilaterally. Skull: Intact Sinuses/Orbits: Remote right medial orbital wall fracture again identified. The visualized orbits are otherwise unremarkable. The visualized paranasal sinuses are clear. Other: Mastoid air cells and middle ear cavities are clear. IMPRESSION: No evidence of acute intracranial hemorrhage or infarct. Stable remote infarcts and advanced senescent change. Electronically Signed   By: Fidela Salisbury MD   On: 11/29/2020 01:49     Assessment/Plan 65 year old female with history of DM, HTN, CVA with residual left hemiplegia, CKD 3a, class III obesity  presenting with headache, nausea and elevated blood pressure.      Intractable headache - CT head with no evidence of acute intracranial hemorrhage or infarct - IV Reglan.  Avoid Toradol due to kidney function - We will try Fioricet, tramadol - Neurologic checks - Neurology consult in the a.m.  Uncontrolled blood pressure/hypertensive urgency - Systolic blood pressure 460-479 - Continue home amlodipine, clonidine, metoprolol oral hydralazine and titrate  to goal - Patient received IV hydralazine in the ER    Acute kidney injury superimposed on CKD llla ([place]) - Creatinine 2.74 from baseline of 1.73 - IV hydration and monitor - Consider nephrology consult if not improving with hydration    Type 2 diabetes mellitus with complications (HCC) - Sliding scale insulin coverage    History of cerebrovascular accident (CVA) with residual left hemiplegia and seizure disorder - Continue Plavix and atorvastatin - Continue Keppra - Head CT with no acute findings and patient has no new focal deficits    Obesity, Class III, BMI 40-49.9 (morbid obesity) ([place]) - Complicating factor to overall prognosis and care    DVT prophylaxis: Lovenox  Code Status: Partial code Family Communication:  none  Disposition Plan: Back to previous home environment Consults called: none  Status: Observation    Athena Masse MD [facility]     11/29/2020, 3:13 AM

## 2020-11-29 NOTE — ED Notes (Signed)
Pt provided with lunch tray.

## 2020-11-29 NOTE — Progress Notes (Signed)
Anticoagulation monitoring(Lovenox):  65 yo  female ordered Lovenox 30  mg Q24h  Filed Weights   11/28/20 2237  Weight: 104.8 kg (231 lb)   BMI 42.25    Lab Results  Component Value Date   CREATININE 2.74 (H) 11/28/2020   CREATININE 1.76 (H) 05/07/2020   CREATININE 1.73 (H) 05/06/2020   Estimated Creatinine Clearance: 23.6 mL/min (A) (by C-G formula based on SCr of 2.74 mg/dL (H)). Hemoglobin & Hematocrit     Component Value Date/Time   HGB 10.0 (L) 11/28/2020 2341   HGB 11.2 01/23/2019 1442   HCT 31.0 (L) 11/28/2020 2341   HCT 35.1 01/23/2019 1442     Per Protocol for Patient with estCrcl < 30 ml/min and BMI > 40, will transition to Lovenox 40 mg Q24h.

## 2020-11-30 ENCOUNTER — Inpatient Hospital Stay: Payer: Medicare Other

## 2020-11-30 DIAGNOSIS — I69354 Hemiplegia and hemiparesis following cerebral infarction affecting left non-dominant side: Secondary | ICD-10-CM | POA: Diagnosis not present

## 2020-11-30 DIAGNOSIS — E785 Hyperlipidemia, unspecified: Secondary | ICD-10-CM | POA: Diagnosis not present

## 2020-11-30 DIAGNOSIS — F32A Depression, unspecified: Secondary | ICD-10-CM | POA: Diagnosis not present

## 2020-11-30 DIAGNOSIS — I129 Hypertensive chronic kidney disease with stage 1 through stage 4 chronic kidney disease, or unspecified chronic kidney disease: Secondary | ICD-10-CM | POA: Diagnosis not present

## 2020-11-30 DIAGNOSIS — I69998 Other sequelae following unspecified cerebrovascular disease: Secondary | ICD-10-CM | POA: Diagnosis not present

## 2020-11-30 DIAGNOSIS — M1990 Unspecified osteoarthritis, unspecified site: Secondary | ICD-10-CM | POA: Diagnosis not present

## 2020-11-30 DIAGNOSIS — Z23 Encounter for immunization: Secondary | ICD-10-CM | POA: Diagnosis not present

## 2020-11-30 DIAGNOSIS — E1140 Type 2 diabetes mellitus with diabetic neuropathy, unspecified: Secondary | ICD-10-CM | POA: Diagnosis not present

## 2020-11-30 DIAGNOSIS — R531 Weakness: Secondary | ICD-10-CM | POA: Diagnosis not present

## 2020-11-30 DIAGNOSIS — N1831 Chronic kidney disease, stage 3a: Secondary | ICD-10-CM | POA: Diagnosis not present

## 2020-11-30 DIAGNOSIS — Z881 Allergy status to other antibiotic agents status: Secondary | ICD-10-CM | POA: Diagnosis not present

## 2020-11-30 DIAGNOSIS — E538 Deficiency of other specified B group vitamins: Secondary | ICD-10-CM | POA: Diagnosis not present

## 2020-11-30 DIAGNOSIS — M6281 Muscle weakness (generalized): Secondary | ICD-10-CM | POA: Diagnosis not present

## 2020-11-30 DIAGNOSIS — N170 Acute kidney failure with tubular necrosis: Secondary | ICD-10-CM | POA: Diagnosis not present

## 2020-11-30 DIAGNOSIS — E1142 Type 2 diabetes mellitus with diabetic polyneuropathy: Secondary | ICD-10-CM | POA: Diagnosis not present

## 2020-11-30 DIAGNOSIS — E1122 Type 2 diabetes mellitus with diabetic chronic kidney disease: Secondary | ICD-10-CM | POA: Diagnosis not present

## 2020-11-30 DIAGNOSIS — Z6841 Body Mass Index (BMI) 40.0 and over, adult: Secondary | ICD-10-CM | POA: Diagnosis not present

## 2020-11-30 DIAGNOSIS — E1169 Type 2 diabetes mellitus with other specified complication: Secondary | ICD-10-CM | POA: Diagnosis not present

## 2020-11-30 DIAGNOSIS — G40909 Epilepsy, unspecified, not intractable, without status epilepticus: Secondary | ICD-10-CM | POA: Diagnosis present

## 2020-11-30 DIAGNOSIS — R1312 Dysphagia, oropharyngeal phase: Secondary | ICD-10-CM | POA: Diagnosis not present

## 2020-11-30 DIAGNOSIS — Z8673 Personal history of transient ischemic attack (TIA), and cerebral infarction without residual deficits: Secondary | ICD-10-CM | POA: Diagnosis not present

## 2020-11-30 DIAGNOSIS — R4789 Other speech disturbances: Secondary | ICD-10-CM

## 2020-11-30 DIAGNOSIS — N179 Acute kidney failure, unspecified: Secondary | ICD-10-CM | POA: Diagnosis present

## 2020-11-30 DIAGNOSIS — I160 Hypertensive urgency: Secondary | ICD-10-CM | POA: Diagnosis not present

## 2020-11-30 DIAGNOSIS — K219 Gastro-esophageal reflux disease without esophagitis: Secondary | ICD-10-CM | POA: Diagnosis not present

## 2020-11-30 DIAGNOSIS — I69398 Other sequelae of cerebral infarction: Secondary | ICD-10-CM | POA: Diagnosis not present

## 2020-11-30 DIAGNOSIS — I130 Hypertensive heart and chronic kidney disease with heart failure and stage 1 through stage 4 chronic kidney disease, or unspecified chronic kidney disease: Secondary | ICD-10-CM | POA: Diagnosis not present

## 2020-11-30 DIAGNOSIS — I10 Essential (primary) hypertension: Secondary | ICD-10-CM | POA: Diagnosis not present

## 2020-11-30 DIAGNOSIS — Z66 Do not resuscitate: Secondary | ICD-10-CM | POA: Diagnosis not present

## 2020-11-30 DIAGNOSIS — Z20822 Contact with and (suspected) exposure to covid-19: Secondary | ICD-10-CM | POA: Diagnosis not present

## 2020-11-30 DIAGNOSIS — K589 Irritable bowel syndrome without diarrhea: Secondary | ICD-10-CM | POA: Diagnosis not present

## 2020-11-30 DIAGNOSIS — R21 Rash and other nonspecific skin eruption: Secondary | ICD-10-CM | POA: Diagnosis not present

## 2020-11-30 DIAGNOSIS — G9341 Metabolic encephalopathy: Secondary | ICD-10-CM | POA: Diagnosis not present

## 2020-11-30 DIAGNOSIS — L03818 Cellulitis of other sites: Secondary | ICD-10-CM | POA: Diagnosis not present

## 2020-11-30 DIAGNOSIS — Z7982 Long term (current) use of aspirin: Secondary | ICD-10-CM | POA: Diagnosis not present

## 2020-11-30 DIAGNOSIS — N3281 Overactive bladder: Secondary | ICD-10-CM | POA: Diagnosis not present

## 2020-11-30 DIAGNOSIS — J309 Allergic rhinitis, unspecified: Secondary | ICD-10-CM | POA: Diagnosis not present

## 2020-11-30 DIAGNOSIS — N281 Cyst of kidney, acquired: Secondary | ICD-10-CM | POA: Diagnosis not present

## 2020-11-30 DIAGNOSIS — R519 Headache, unspecified: Secondary | ICD-10-CM | POA: Diagnosis not present

## 2020-11-30 DIAGNOSIS — Z888 Allergy status to other drugs, medicaments and biological substances status: Secondary | ICD-10-CM | POA: Diagnosis not present

## 2020-11-30 DIAGNOSIS — Z885 Allergy status to narcotic agent status: Secondary | ICD-10-CM | POA: Diagnosis not present

## 2020-11-30 DIAGNOSIS — R413 Other amnesia: Secondary | ICD-10-CM | POA: Diagnosis not present

## 2020-11-30 DIAGNOSIS — N1832 Chronic kidney disease, stage 3b: Secondary | ICD-10-CM | POA: Diagnosis not present

## 2020-11-30 DIAGNOSIS — N189 Chronic kidney disease, unspecified: Secondary | ICD-10-CM | POA: Diagnosis present

## 2020-11-30 DIAGNOSIS — I2510 Atherosclerotic heart disease of native coronary artery without angina pectoris: Secondary | ICD-10-CM | POA: Diagnosis not present

## 2020-11-30 DIAGNOSIS — Z794 Long term (current) use of insulin: Secondary | ICD-10-CM | POA: Diagnosis not present

## 2020-11-30 DIAGNOSIS — R29818 Other symptoms and signs involving the nervous system: Secondary | ICD-10-CM | POA: Diagnosis not present

## 2020-11-30 LAB — URINALYSIS, COMPLETE (UACMP) WITH MICROSCOPIC
Bilirubin Urine: NEGATIVE
Glucose, UA: 50 mg/dL — AB
Hgb urine dipstick: NEGATIVE
Ketones, ur: NEGATIVE mg/dL
Leukocytes,Ua: NEGATIVE
Nitrite: NEGATIVE
Protein, ur: >=300 mg/dL — AB
Specific Gravity, Urine: 1.011 (ref 1.005–1.030)
pH: 5 (ref 5.0–8.0)

## 2020-11-30 LAB — CBC
HCT: 27.5 % — ABNORMAL LOW (ref 36.0–46.0)
Hemoglobin: 9.1 g/dL — ABNORMAL LOW (ref 12.0–15.0)
MCH: 28.7 pg (ref 26.0–34.0)
MCHC: 33.1 g/dL (ref 30.0–36.0)
MCV: 86.8 fL (ref 80.0–100.0)
Platelets: 240 10*3/uL (ref 150–400)
RBC: 3.17 MIL/uL — ABNORMAL LOW (ref 3.87–5.11)
RDW: 13.4 % (ref 11.5–15.5)
WBC: 7.3 10*3/uL (ref 4.0–10.5)
nRBC: 0 % (ref 0.0–0.2)

## 2020-11-30 LAB — BASIC METABOLIC PANEL
Anion gap: 8 (ref 5–15)
BUN: 47 mg/dL — ABNORMAL HIGH (ref 8–23)
CO2: 21 mmol/L — ABNORMAL LOW (ref 22–32)
Calcium: 7.9 mg/dL — ABNORMAL LOW (ref 8.9–10.3)
Chloride: 110 mmol/L (ref 98–111)
Creatinine, Ser: 2.32 mg/dL — ABNORMAL HIGH (ref 0.44–1.00)
GFR, Estimated: 23 mL/min — ABNORMAL LOW (ref >60)
Glucose, Bld: 162 mg/dL — ABNORMAL HIGH (ref 70–99)
Potassium: 3.9 mmol/L (ref 3.5–5.1)
Sodium: 139 mmol/L (ref 135–145)

## 2020-11-30 LAB — GLUCOSE, CAPILLARY
Glucose-Capillary: 203 mg/dL — ABNORMAL HIGH (ref 70–99)
Glucose-Capillary: 230 mg/dL — ABNORMAL HIGH (ref 70–99)
Glucose-Capillary: 164 mg/dL — ABNORMAL HIGH (ref 70–99)
Glucose-Capillary: 238 mg/dL — ABNORMAL HIGH (ref 70–99)

## 2020-11-30 MED ORDER — BOOST / RESOURCE BREEZE PO LIQD CUSTOM
1.0000 | ORAL | Status: DC
  Administered 2020-12-01 – 2020-12-03 (×3): 1 via ORAL

## 2020-11-30 MED ORDER — HYDRALAZINE HCL 20 MG/ML IJ SOLN
5.0000 mg | Freq: Four times a day (QID) | INTRAMUSCULAR | Status: DC | PRN
  Administered 2020-11-30 – 2020-12-02 (×4): 5 mg via INTRAVENOUS
  Filled 2020-11-30: qty 1
  Filled 2020-11-30: qty 0.25
  Filled 2020-11-30 (×2): qty 1

## 2020-11-30 MED ORDER — INSULIN GLARGINE 100 UNIT/ML ~~LOC~~ SOLN
8.0000 [IU] | Freq: Every day | SUBCUTANEOUS | Status: DC
  Administered 2020-11-30: 8 [IU] via SUBCUTANEOUS
  Filled 2020-11-30 (×2): qty 0.08

## 2020-11-30 MED ORDER — HYDRALAZINE HCL 50 MG PO TABS
25.0000 mg | ORAL_TABLET | Freq: Three times a day (TID) | ORAL | Status: DC
  Administered 2020-11-30 – 2020-12-01 (×3): 25 mg via ORAL
  Filled 2020-11-30: qty 1
  Filled 2020-11-30: qty 0.5
  Filled 2020-11-30 (×2): qty 1

## 2020-11-30 NOTE — Plan of Care (Signed)
  Problem: Education: Goal: Knowledge of General Education information will improve Description: Including pain rating scale, medication(s)/side effects and non-pharmacologic comfort measures Outcome: Progressing   Problem: Coping: Goal: Level of anxiety will decrease Outcome: Progressing   Problem: Elimination: Goal: Will not experience complications related to bowel motility Outcome: Progressing Goal: Will not experience complications related to urinary retention Outcome: Progressing   Problem: Safety: Goal: Ability to remain free from injury will improve Outcome: Progressing   Problem: Skin Integrity: Goal: Risk for impaired skin integrity will decrease Outcome: Progressing   

## 2020-11-30 NOTE — Progress Notes (Signed)
Initial Nutrition Assessment  DOCUMENTATION CODES:  Obesity unspecified  INTERVENTION:   Continue current diet as ordered. Could consider liberalization if intake declines  Boost Breeze po 1x, each supplement provides 250 kcal and 9 grams of protein  [employer] Breakfast 1x/d- each packet provides 130kcal and 5g protein + milk  NUTRITION DIAGNOSIS:  Inadequate oral intake related to poor appetite as evidenced by per patient/family report.  GOAL:  Patient will meet greater than or equal to 90% of their needs  MONITOR:  PO intake,Supplement acceptance  REASON FOR ASSESSMENT:  Malnutrition Screening Tool    ASSESSMENT:  Pt presented to ED with a throbbing headache x 1 day accompanied by nausea. BP significantly elevated in ED. PMH relevant for depression, HTN, GERD, CVA with residual left hemiplegia, DM, HLD, IBS, CKD3.  Pt resting in bed at the time of visit. Family at bedside assists with nutrition interview. Daughter reports that pt was in rehab for several months at the end of 2021. Returned home in January and since that time appetite and energy have been variable. Some days will eat three meals and others will not eat at all. Mobility and energy follow a similar pattern. Likely also not consuming sufficient fluid either. Family reports that pt has lost weight, but unsure what usual is. Left hand remains weak from prior CVA. Good intake of lunch tray noted, family states this is much better than she would be eating at home.   Discussed with pt the need for consistent energy intake from day to day. Pt does not like ensure shakes, but is open to trying boost breeze and carnation instant breakfast.   Average Meal Intake: . 5/15-5/17: 100% intake x 1 recorded meal  Relevant Scheduled Meds: . atorvastatin  20 mg Oral Daily  . insulin aspart  0-20 Units Subcutaneous TID WC  . insulin aspart  0-5 Units Subcutaneous QHS  . pantoprazole  40 mg Oral Daily   Continuous  Infusions: . sodium chloride 75 mL/hr at 11/30/20 0609   Relevant PRN Meds: ondansetron  Labs reviewed:  SBG ranges from 118-230 mg/dL over the last 24 hours  HgbA1c 8.6% (5/16)  BUN 47, Creatinine 2.32  NUTRITION - FOCUSED PHYSICAL EXAM: Flowsheet Row Most Recent Value  Orbital Region No depletion  Upper Arm Region No depletion  Thoracic and Lumbar Region No depletion  Buccal Region No depletion  Temple Region Mild depletion  Clavicle Bone Region No depletion  Clavicle and Acromion Bone Region No depletion  Scapular Bone Region No depletion  Dorsal Hand Mild depletion  Patellar Region No depletion  Anterior Thigh Region No depletion  Posterior Calf Region Mild depletion  Edema (RD Assessment) None  Hair Reviewed  Eyes Reviewed  Mouth Reviewed  Skin Reviewed  Nails Reviewed     Diet Order:   Diet Order            Diet heart healthy/carb modified Room service appropriate? Yes; Fluid consistency: Thin  Diet effective now                EDUCATION NEEDS:  No education needs have been identified at this time  Skin:  Skin Assessment: Reviewed RN Assessment  Last BM:  unsure  Height:  Ht Readings from Last 1 Encounters:  11/28/20 5\' 2"  (1.575 m)    Weight:  Wt Readings from Last 1 Encounters:  11/28/20 104.8 kg    Ideal Body Weight:  50 kg  BMI:  Body mass index is 42.25 kg/m.  Estimated Nutritional Needs:   Kcal:  1600-1800 kcal/d  Protein:  90-100 g/d  Fluid:  >1600 mL/d  Ranell Patrick, RD, LDN Clinical Dietitian Pager on [place]

## 2020-11-30 NOTE — TOC Progression Note (Signed)
Transition of Care [facility]) - Progression Note    Patient Details  Name: Susan House MRN: [id number] Date of Birth: [birth year]  Transition [facility]) CM/SW [place], RN Phone Number: 11/30/2020, 9:50 AM  Clinical Narrative:   TOC in to see patient re: discharge planning, SNF recommendation.  Patient asked that I call spouse.  Spoke to patients spouse, who was amenable to bed search for SNF.  Directed to Medicare ratings.  Spouse verbalized understanding, no further questions for TOC.  Bed search started.         Expected Discharge Plan and Services                                                 Social Determinants of Health (SDOH) Interventions    Readmission Risk Interventions No flowsheet data found.

## 2020-11-30 NOTE — Progress Notes (Signed)
Patient ID: Susan House, female   DOB: [birth year], 65 y.o.   MRN: [id number] [facility] PROGRESS NOTE  Junia Nygren SHF:[id number] DOB: 1955/07/26 DOA: 11/28/2020 PCP: Jerrol Banana., MD  HPI/Subjective: Patient today having more difficulty finding her words than yesterday.  At times she is very quick with her words but other times she is very slow.  Family states that she has been having difficulty even transferring at this point.  Patient feels okay and offers no complaints.  Family states that she has been having more problems with her memory recently.  Family also watches her blood pressure quite closely and has been high as outpatient.    Objective: Vitals:   11/30/20 0752 11/30/20 1149  BP: (!) 171/66 139/65  Pulse: 64 62  Resp: 16 18  Temp: 98.1 F (36.7 C) 97.9 F (36.6 C)  SpO2: 99% 100%    Intake/Output Summary (Last 24 hours) at 11/30/2020 1330 Last data filed at 11/30/2020 5027 Gross per 24 hour  Intake 1253.12 ml  Output 650 ml  Net 603.12 ml   Filed Weights   11/28/20 2237  Weight: 104.8 kg    ROS: Review of Systems  Respiratory: Negative for shortness of breath.   Cardiovascular: Negative for chest pain.  Gastrointestinal: Negative for abdominal pain.  Neurological: Negative for dizziness.   Exam: Physical Exam HENT:     Head: Normocephalic.     Mouth/Throat:     Pharynx: No oropharyngeal exudate.  Eyes:     General: Lids are normal.     Conjunctiva/sclera: Conjunctivae normal.     Pupils: Pupils are equal, round, and reactive to light.  Cardiovascular:     Rate and Rhythm: Normal rate and regular rhythm.     Heart sounds: Normal heart sounds, S1 normal and S2 normal.  Pulmonary:     Breath sounds: No decreased breath sounds, wheezing, rhonchi or rales.  Abdominal:     Palpations: Abdomen is soft.     Tenderness: There is no abdominal tenderness.  Musculoskeletal:     Right ankle: No swelling.     Left ankle: No  swelling.  Skin:    General: Skin is warm.     Findings: No rash.  Neurological:     Mental Status: She is alert.     Comments: Today with more word finding difficulty.  At times able to speak clearly but at other times have trouble finding the words to say.  Chronic left-sided weakness       Data Reviewed: Basic Metabolic Panel: Recent Labs  Lab 11/28/20 2341 11/29/20 1015 11/30/20 0419  NA 141 141 139  K 4.3 3.6 3.9  CL 110 112* 110  CO2 19* 20* 21*  GLUCOSE 198* 161* 162*  BUN 54* 49* 47*  CREATININE 2.74* 2.63* 2.32*  CALCIUM 8.2* 7.9* 7.9*   CBC: Recent Labs  Lab 11/28/20 2341 11/30/20 0419  WBC 7.3 7.3  NEUTROABS 4.4  --   HGB 10.0* 9.1*  HCT 31.0* 27.5*  MCV 88.8 86.8  PLT 268 240    CBG: Recent Labs  Lab 11/29/20 1104 11/29/20 1719 11/29/20 2133 11/30/20 0750 11/30/20 1148  GLUCAP 118* 178* 125* 203* 230*    Recent Results (from the past 240 hour(s))  Resp Panel by RT-PCR (Flu A&B, Covid) Nasopharyngeal Swab     Status: None   Collection Time: 11/29/20  2:20 AM   Specimen: Nasopharyngeal Swab; Nasopharyngeal(NP) swabs in vial transport medium  Result  Value Ref Range Status   SARS Coronavirus 2 by RT PCR NEGATIVE NEGATIVE Final    Comment: (NOTE) SARS-CoV-2 target nucleic acids are NOT DETECTED.  The SARS-CoV-2 RNA is generally detectable in upper respiratory specimens during the acute phase of infection. The lowest concentration of SARS-CoV-2 viral copies this assay can detect is 138 copies/mL. A negative result does not preclude SARS-Cov-2 infection and should not be used as the sole basis for treatment or other patient management decisions. A negative result may occur with  improper specimen collection/handling, submission of specimen other than nasopharyngeal swab, presence of viral mutation(s) within the areas targeted by this assay, and inadequate number of viral copies(<138 copies/mL). A negative result must be combined  with clinical observations, patient history, and epidemiological information. The expected result is Negative.  Fact Sheet for Patients:  EntrepreneurPulse.com.au  Fact Sheet for Healthcare Providers:  IncredibleEmployment.be  This test is no t yet approved or cleared by the [place] FDA and  has been authorized for detection and/or diagnosis of SARS-CoV-2 by FDA under an Emergency Use Authorization (EUA). This EUA will remain  in effect (meaning this test can be used) for the duration of the COVID-19 declaration under Section 564(b)(1) of the Act, 21 U.S.C.section 360bbb-3(b)(1), unless the authorization is terminated  or revoked sooner.       Influenza A by PCR NEGATIVE NEGATIVE Final   Influenza B by PCR NEGATIVE NEGATIVE Final    Comment: (NOTE) The Xpert Xpress SARS-CoV-2/FLU/RSV plus assay is intended as an aid in the diagnosis of influenza from Nasopharyngeal swab specimens and should not be used as a sole basis for treatment. Nasal washings and aspirates are unacceptable for Xpert Xpress SARS-CoV-2/FLU/RSV testing.  Fact Sheet for Patients: EntrepreneurPulse.com.au  Fact Sheet for Healthcare Providers: IncredibleEmployment.be  This test is not yet approved or cleared by the [place] FDA and has been authorized for detection and/or diagnosis of SARS-CoV-2 by FDA under an Emergency Use Authorization (EUA). This EUA will remain in effect (meaning this test can be used) for the duration of the COVID-19 declaration under Section 564(b)(1) of the Act, 21 U.S.C. section 360bbb-3(b)(1), unless the authorization is terminated or revoked.  Performed at [facility], [address] [place], [place] [address]      Studies: CT Head Wo Contrast  Result Date: 11/29/2020 CLINICAL DATA:  Headache EXAM: CT HEAD WITHOUT CONTRAST TECHNIQUE: Contiguous axial images were obtained from the  base of the skull through the vertex without intravenous contrast. COMPARISON:  CT 05/04/2020, MRI 05/05/2020 FINDINGS: Brain: Moderate parenchymal volume loss is again identified, slightly advanced in relation to the patient's age, but stable since prior examination. Remote left periventricular white matter and lentiform nuclear infarcts are again identified with ex vacuo dilation of the frontal horn of the left lateral ventricle. Remote infarct involving the posterior limb of the right internal capsule again noted. Mild periventricular white matter changes are present likely reflecting the sequela of small vessel ischemia. No evidence of acute intracranial hemorrhage or infarct. No abnormal mass effect or midline shift. No abnormal intra or extra-axial mass lesion or fluid collection. Borderline ventriculomegaly is stable, likely the sequela of central atrophy. The cerebellum is unremarkable. Asymmetric atrophy involving the cerebral peduncles and pons is again noted. Vascular: No asymmetric hyperdense vasculature at the skull base. Advanced vascular calcifications are noted within the carotid siphons and distal vertebral arteries bilaterally. Skull: Intact Sinuses/Orbits: Remote right medial orbital wall fracture again identified. The visualized orbits are otherwise unremarkable.  The visualized paranasal sinuses are clear. Other: Mastoid air cells and middle ear cavities are clear. IMPRESSION: No evidence of acute intracranial hemorrhage or infarct. Stable remote infarcts and advanced senescent change. Electronically Signed   By: Fidela Salisbury MD   On: 11/29/2020 01:49   DG Chest Port 1 View  Result Date: 11/29/2020 CLINICAL DATA:  Weakness EXAM: PORTABLE CHEST 1 VIEW COMPARISON:  04/27/2020 FINDINGS: Cardiac shadow is stable. Mitral annular calcifications and aortic calcifications are again seen and stable. The lungs are clear. No acute bony abnormality is noted. Old non healed right clavicular fracture is  noted. IMPRESSION: No acute abnormality noted. Electronically Signed   By: Inez Catalina M.D.   On: 11/29/2020 17:46    Scheduled Meds: . amLODipine  10 mg Oral Daily  . aspirin  81 mg Oral Daily  . atorvastatin  20 mg Oral Daily  . cloNIDine  0.3 mg Oral BID  . clopidogrel  75 mg Oral Daily  . enalapril  20 mg Oral BID  . enoxaparin (LOVENOX) injection  40 mg Subcutaneous Q24H  . gabapentin  200 mg Oral BID  . hydrALAZINE  25 mg Oral TID  . insulin aspart  0-20 Units Subcutaneous TID WC  . insulin aspart  0-5 Units Subcutaneous QHS  . levETIRAcetam  500 mg Oral BID  . metoprolol succinate  100 mg Oral Daily  . pantoprazole  40 mg Oral Daily   Continuous Infusions: . sodium chloride 75 mL/hr at 11/30/20 1155   Brief history admitted with headache and elevated blood pressure and acute kidney injury on chronic kidney disease stage IIIa.  Patient has a past medical history of CVA with left-sided weakness, seizure disorder.  The patient's headache had resolved soon after coming into the hospital.  Family states that the patient has been having more difficulty with transfers recently.  Physical therapy recommending rehab.  Patient having more difficulty with word finding on 11/30/2020 and MRI of the brain was ordered.  Assessment/Plan:  1. Acute kidney injury on chronic kidney disease stage IIIa.  Continue IV fluid hydration.  Creatinine 2.74 on presentation and down to 2.32 today.  Baseline creatinine around 1.76. 2. Word finding difficulty today.  History of stroke with left-sided weakness.  Will get MRI of the brain.  Continue aspirin, Plavix and atorvastatin.  Family states that sometimes she has good days and sometimes she has bad days.  Sometimes she has this word finding difficulty at home also. 3. Essential hypertension.  Patient on Norvasc, enalapril Toprol and clonidine.  I added hydralazine yesterday.  Blood pressure normal range today.  We will cut back on hydralazine  dose. 4. Headache has resolved 5. Weakness.  Physical therapy recommends rehab.  No signs of infection with urine analysis and checks x-ray again negative. 6. Seizure disorder on Keppra 7. Type 2 diabetes mellitus with hyperlipidemia on atorvastatin.  Since sugars up today we will add low-dose Lantus at night 8. Diabetic neuropathy on gabapentin 9. Memory loss as per family.  Check B12, RPR and TSH tomorrow morning. 10. Obesity with a BMI of 42.25     Code Status:     Code Status Orders  (From admission, onward)         Start     Ordered   11/29/20 0924  Do not attempt resuscitation (DNR)  Continuous       Question Answer Comment  In the event of cardiac or respiratory ARREST Do not call a "code blue"  In the event of cardiac or respiratory ARREST Do not perform Intubation, CPR, defibrillation or ACLS   In the event of cardiac or respiratory ARREST Use medication by any route, position, wound care, and other measures to relive pain and suffering. May use oxygen, suction and manual treatment of airway obstruction as needed for comfort.   Comments nurse may pronounce      11/29/20 0923        Code Status History    Date Active Date Inactive Code Status Order ID Comments User Context   11/29/2020 0512 11/29/2020 0923 Partial Code [id number]  Athena Masse, MD ED   11/29/2020 0333 11/29/2020 0512 Full Code [id number]  Athena Masse, MD ED   04/27/2020 0325 05/09/2020 1820 Partial Code [id number]  Mansy, Arvella Merles, MD ED   12/23/2019 1705 12/25/2019 1927 Partial Code [id number]  Ivor Costa, MD ED   12/23/2019 1649 12/23/2019 1704 Full Code [id number]  Ivor Costa, MD ED   11/10/2019 0353 11/13/2019 2039 DNR [id number]  Sidney Ace, Arvella Merles, MD ED   09/25/2018 0921 09/26/2018 1948 DNR [id number]  Dustin Flock, MD ED   08/28/2018 2226 08/30/2018 1657 Full Code [id number]  [name], MD Inpatient   03/31/2018 2006 04/02/2018 1733 DNR [id number]  Sela Hua, MD Inpatient   10/03/2017 2208 10/04/2017 1718 Full  Code [id number]  Dustin Flock, MD Inpatient   10/15/2016 2201 10/17/2016 1624 Full Code [id number]  Vaughan Basta, MD ED   07/08/2016 0437 07/11/2016 1610 Full Code [id number]  Saundra Shelling, MD ED   12/26/2015 1847 12/28/2015 2027 Full Code [id number]  Idelle Crouch, MD Inpatient   07/22/2015 0735 07/23/2015 1505 Full Code [id number]  Saundra Shelling, MD Inpatient   Advance Care Planning Activity    Advance Directive Documentation   Flowsheet Row Most Recent Value  Type of Advance Directive Healthcare Power of Attorney  Pre-existing out of facility DNR order (yellow form or pink MOST form) --  "MOST" Form in Place? --     Family Communication: Spoke with patient's husband and daughter on the phone Disposition Plan: Status is: Observation   Dispo: The patient is from: Home              Anticipated d/c is to: Rehab              Patient currently with some word finding difficulty today, I will MRI of the brain to rule out stroke.  With blood pressure being normal this may be too low for her I will cut back on the hydralazine dose.   Difficult to place patient.  Hopefully not  Time spent: 28 minutes  [place]
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
## 2020-11-30 NOTE — Progress Notes (Addendum)
New IV line inserted by CN Pt's SBP elevated upper 180's, no s/sx of hypertension  MD notified, gave an order for PRN hydralazine Medication administered, will monitor VS at around 4am  Current BP 154/88

## 2020-12-01 LAB — BASIC METABOLIC PANEL
Anion gap: 8 (ref 5–15)
BUN: 50 mg/dL — ABNORMAL HIGH (ref 8–23)
CO2: 20 mmol/L — ABNORMAL LOW (ref 22–32)
Calcium: 7.9 mg/dL — ABNORMAL LOW (ref 8.9–10.3)
Chloride: 108 mmol/L (ref 98–111)
Creatinine, Ser: 2.44 mg/dL — ABNORMAL HIGH (ref 0.44–1.00)
GFR, Estimated: 22 mL/min — ABNORMAL LOW (ref >60)
Glucose, Bld: 261 mg/dL — ABNORMAL HIGH (ref 70–99)
Potassium: 3.8 mmol/L (ref 3.5–5.1)
Sodium: 136 mmol/L (ref 135–145)

## 2020-12-01 LAB — RPR: RPR Ser Ql: NONREACTIVE

## 2020-12-01 LAB — TSH: TSH: 2.65 u[IU]/mL (ref 0.350–4.500)

## 2020-12-01 LAB — GLUCOSE, CAPILLARY
Glucose-Capillary: 224 mg/dL — ABNORMAL HIGH (ref 70–99)
Glucose-Capillary: 312 mg/dL — ABNORMAL HIGH (ref 70–99)
Glucose-Capillary: 235 mg/dL — ABNORMAL HIGH (ref 70–99)
Glucose-Capillary: 115 mg/dL — ABNORMAL HIGH (ref 70–99)

## 2020-12-01 LAB — VITAMIN B12: Vitamin B-12: 149 pg/mL — ABNORMAL LOW (ref 180–914)

## 2020-12-01 MED ORDER — INSULIN ASPART 100 UNIT/ML IJ SOLN
3.0000 [IU] | Freq: Three times a day (TID) | INTRAMUSCULAR | Status: DC
  Administered 2020-12-01 – 2020-12-03 (×6): 3 [IU] via SUBCUTANEOUS
  Filled 2020-12-01 (×5): qty 1

## 2020-12-01 MED ORDER — INSULIN GLARGINE 100 UNIT/ML ~~LOC~~ SOLN
12.0000 [IU] | Freq: Every day | SUBCUTANEOUS | Status: DC
  Administered 2020-12-01 – 2020-12-02 (×2): 12 [IU] via SUBCUTANEOUS
  Filled 2020-12-01 (×3): qty 0.12

## 2020-12-01 MED ORDER — CYANOCOBALAMIN 1000 MCG/ML IJ SOLN
1000.0000 ug | Freq: Every day | INTRAMUSCULAR | Status: AC
  Administered 2020-12-01 – 2020-12-03 (×3): 1000 ug via INTRAMUSCULAR
  Filled 2020-12-01 (×3): qty 1

## 2020-12-01 MED ORDER — HYDRALAZINE HCL 50 MG PO TABS
50.0000 mg | ORAL_TABLET | Freq: Three times a day (TID) | ORAL | Status: DC
  Administered 2020-12-01 (×2): 50 mg via ORAL
  Filled 2020-12-01 (×2): qty 1

## 2020-12-01 MED ORDER — CYANOCOBALAMIN 1000 MCG/ML IJ SOLN: 1000.0000 ug | INTRAMUSCULAR | Status: DC

## 2020-12-01 NOTE — Progress Notes (Signed)
PROGRESS NOTE    Susan House  EGB:[id number] DOB: 1956/06/09 DOA: 11/28/2020 PCP: Jerrol Banana., MD   Chief Complaint  Patient presents with  . Headache    Brief Narrative:    Brief history admitted with headache and elevated blood pressure and acute kidney injury on chronic kidney disease stage IIIa.  Patient has a past medical history of CVA with left-sided weakness, seizure disorder.  The patient's headache had resolved soon after coming into the hospital.  Family states that the patient has been having more difficulty with transfers recently.  Physical therapy recommending rehab.  Patient having more difficulty with word finding on 11/30/2020 and MRI with no acute findings.  Assessment & Plan:   Active Problems:   Essential hypertension   Type 2 diabetes mellitus with complications (HCC)   Type 2 diabetes mellitus with hyperlipidemia (HCC)   Hx of completed stroke   Nonintractable headache   Hypertensive urgency   Acute kidney injury superimposed on chronic kidney disease (HCC)   Obesity, Class III, BMI 40-49.9 (morbid obesity) ([place])   Acute kidney injury ([place])   Diabetic polyneuropathy associated with type 2 diabetes mellitus (HCC)   Word finding difficulty   Acute kidney injury superimposed on CKD ([place])  Acute kidney injury on chronic kidney disease stage IIIa.  -Improving with IV hydration, baseline around 1.76 .  Creatinine 2.74 on presentation improving, it is 2.4 today . -Avoid nephrotoxic medication, continue with hydration.    Word finding difficulty today.  -  History of stroke with left-sided weakness.  MRI with no acute finding, but B12 significantly low at 149.  B12 deficiency -Started on IM supplement, can transition to oral on discharge.  History of CVA  -  Continue aspirin, Plavix and atorvastatin.  Essential hypertension. -   Patient on Norvasc, enalapril Toprol and clonidine.  Started on hydralazine as well given uncontrolled blood  pressure, remains elevated, given AKI I will discontinue enalapril for now, and increase hydralazine.  Headache has resolved  Weakness.  Physical therapy recommends rehab.  No signs of infection with urine analysis and checks x-ray again negative.  Seizure disorder  - on Keppra  Type 2 diabetes mellitus with hyperlipidemia on atorvastatin.   Will increase Lantus to 12 units, and add 3 units of NovoLog before meals as well.  Diabetic neuropathy on gabapentin  Memory loss as per family.    RPR and TSH within normal limit, but B12 is significantly low, likely contributing to her symptoms.    Obesity with a BMI of 42.25   DVT prophylaxis: Heparin Code Status: DNR Family Communication: none at bedside Disposition:   Status is: Inpatient  Remains inpatient appropriate because:Unsafe d/c plan   Dispo: The patient is from: Home              Anticipated d/c is to: SNF              Patient currently is not medically stable to d/c.   Difficult to place patient No       Consultants:     Subjective:  Patient denies any complaints, no new weakness, denies any speech difficulty, swallowing difficulty as well.  Objective: Vitals:   12/01/20 0143 12/01/20 0342 12/01/20 0857 12/01/20 1123  BP: (!) 174/63 (!) 169/68 (!) 178/76 (!) 162/63  Pulse: 69 67 69 68  Resp:  15 18 14   Temp:  98 F (36.7 C) 97.6 F (36.4 C) 98.1 F (36.7 C)  TempSrc:  Oral Oral Oral  SpO2:  100% 100% 98%  Weight:      Height:        Intake/Output Summary (Last 24 hours) at 12/01/2020 1203 Last data filed at 12/01/2020 0511 Gross per 24 hour  Intake 2138.56 ml  Output 1050 ml  Net 1088.56 ml   Filed Weights   11/28/20 2237  Weight: 104.8 kg    Examination:  General exam: Appears calm and comfortable  Respiratory system: Clear to auscultation. Respiratory effort normal. Cardiovascular system: S1 & S2 heard, RRR. No JVD, murmurs, rubs, gallops or clicks. No pedal edema. Gastrointestinal  system: Abdomen is nondistended, soft and nontender. No organomegaly or masses felt. Normal bowel sounds heard. Central nervous system: Alert and oriented.  Extremities: She is with chronic left-sided weakness, no word finding difficulty today Skin: No rashes, lesions or ulcers Psychiatry: Judgement and insight appear normal. Mood & affect appropriate.     Data Reviewed: I have personally reviewed following labs and imaging studies  CBC: Recent Labs  Lab 11/28/20 2341 11/30/20 0419  WBC 7.3 7.3  NEUTROABS 4.4  --   HGB 10.0* 9.1*  HCT 31.0* 27.5*  MCV 88.8 86.8  PLT 268 681    Basic Metabolic Panel: Recent Labs  Lab 11/28/20 2341 11/29/20 1015 11/30/20 0419 12/01/20 0445  NA 141 141 139 136  K 4.3 3.6 3.9 3.8  CL 110 112* 110 108  CO2 19* 20* 21* 20*  GLUCOSE 198* 161* 162* 261*  BUN 54* 49* 47* 50*  CREATININE 2.74* 2.63* 2.32* 2.44*  CALCIUM 8.2* 7.9* 7.9* 7.9*    GFR: Estimated Creatinine Clearance: 26.5 mL/min (A) (by C-G formula based on SCr of 2.44 mg/dL (H)).  Liver Function Tests: No results for input(s): AST, ALT, ALKPHOS, BILITOT, PROT, ALBUMIN in the last 168 hours.  CBG: Recent Labs  Lab 11/30/20 1148 11/30/20 1608 11/30/20 2116 12/01/20 0726 12/01/20 1129  GLUCAP 230* 164* 238* 224* 312*     Recent Results (from the past 240 hour(s))  Resp Panel by RT-PCR (Flu A&B, Covid) Nasopharyngeal Swab     Status: None   Collection Time: 11/29/20  2:20 AM   Specimen: Nasopharyngeal Swab; Nasopharyngeal(NP) swabs in vial transport medium  Result Value Ref Range Status   SARS Coronavirus 2 by RT PCR NEGATIVE NEGATIVE Final    Comment: (NOTE) SARS-CoV-2 target nucleic acids are NOT DETECTED.  The SARS-CoV-2 RNA is generally detectable in upper respiratory specimens during the acute phase of infection. The lowest concentration of SARS-CoV-2 viral copies this assay can detect is 138 copies/mL. A negative result does not preclude SARS-Cov-2 infection  and should not be used as the sole basis for treatment or other patient management decisions. A negative result may occur with  improper specimen collection/handling, submission of specimen other than nasopharyngeal swab, presence of viral mutation(s) within the areas targeted by this assay, and inadequate number of viral copies(<138 copies/mL). A negative result must be combined with clinical observations, patient history, and epidemiological information. The expected result is Negative.  Fact Sheet for Patients:  EntrepreneurPulse.com.au  Fact Sheet for Healthcare Providers:  IncredibleEmployment.be  This test is no t yet approved or cleared by the [place] FDA and  has been authorized for detection and/or diagnosis of SARS-CoV-2 by FDA under an Emergency Use Authorization (EUA). This EUA will remain  in effect (meaning this test can be used) for the duration of the COVID-19 declaration under Section 564(b)(1) of the Act, 21 U.S.C.section 360bbb-3(b)(1), unless the  authorization is terminated  or revoked sooner.       Influenza A by PCR NEGATIVE NEGATIVE Final   Influenza B by PCR NEGATIVE NEGATIVE Final    Comment: (NOTE) The Xpert Xpress SARS-CoV-2/FLU/RSV plus assay is intended as an aid in the diagnosis of influenza from Nasopharyngeal swab specimens and should not be used as a sole basis for treatment. Nasal washings and aspirates are unacceptable for Xpert Xpress SARS-CoV-2/FLU/RSV testing.  Fact Sheet for Patients: EntrepreneurPulse.com.au  Fact Sheet for Healthcare Providers: IncredibleEmployment.be  This test is not yet approved or cleared by the [place] FDA and has been authorized for detection and/or diagnosis of SARS-CoV-2 by FDA under an Emergency Use Authorization (EUA). This EUA will remain in effect (meaning this test can be used) for the duration of the COVID-19 declaration  under Section 564(b)(1) of the Act, 21 U.S.C. section 360bbb-3(b)(1), unless the authorization is terminated or revoked.  Performed at [facility], [address], [place], [place] [address]          Radiology Studies: MR BRAIN WO CONTRAST  Result Date: 11/30/2020 CLINICAL DATA:  Acute neurologic deficit EXAM: MRI HEAD WITHOUT CONTRAST TECHNIQUE: Multiplanar, multiecho pulse sequences of the brain and surrounding structures were obtained without intravenous contrast. COMPARISON:  05/05/2020 FINDINGS: Brain: No acute infarct, mass effect or extra-axial collection. No acute or chronic hemorrhage. Left frontal encephalomalacia with ex vacuo dilatation of the frontal horn of left lateral ventricle. Generalized volume loss. The midline structures are normal. Vascular: Major flow voids are preserved. Skull and upper cervical spine: Normal calvarium and skull base. Visualized upper cervical spine and soft tissues are normal. Sinuses/Orbits:No paranasal sinus fluid levels or advanced mucosal thickening. No mastoid or middle ear effusion. Normal orbits. IMPRESSION: 1. No acute intracranial abnormality. 2. Multiple old infarcts, left frontal encephalomalacia with ex vacuo dilatation of the frontal horn of the left lateral ventricle. Electronically Signed   By: Ulyses Jarred M.D.   On: 11/30/2020 19:03   DG Chest Port 1 View  Result Date: 11/29/2020 CLINICAL DATA:  Weakness EXAM: PORTABLE CHEST 1 VIEW COMPARISON:  04/27/2020 FINDINGS: Cardiac shadow is stable. Mitral annular calcifications and aortic calcifications are again seen and stable. The lungs are clear. No acute bony abnormality is noted. Old non healed right clavicular fracture is noted. IMPRESSION: No acute abnormality noted. Electronically Signed   By: Inez Catalina M.D.   On: 11/29/2020 17:46        Scheduled Meds: . amLODipine  10 mg Oral Daily  . aspirin  81 mg Oral Daily  . atorvastatin  20 mg Oral Daily  . cloNIDine   0.3 mg Oral BID  . clopidogrel  75 mg Oral Daily  . enalapril  20 mg Oral BID  . enoxaparin (LOVENOX) injection  40 mg Subcutaneous Q24H  . feeding supplement  1 Container Oral Q24H  . gabapentin  200 mg Oral BID  . hydrALAZINE  25 mg Oral TID  . insulin aspart  0-20 Units Subcutaneous TID WC  . insulin aspart  0-5 Units Subcutaneous QHS  . insulin glargine  8 Units Subcutaneous QHS  . levETIRAcetam  500 mg Oral BID  . metoprolol succinate  100 mg Oral Daily  . pantoprazole  40 mg Oral Daily   Continuous Infusions: . sodium chloride Stopped (12/01/20 0500)     LOS: 1 day       Phillips Climes, MD [facility]   To contact the attending provider between 7A-7P or the  covering provider during after hours 7P-7A, please log into the web site [email or web address] and access using universal [place] password for that web site. If you do not have the password, please call the hospital operator.  12/01/2020, 12:03 PM

## 2020-12-01 NOTE — Progress Notes (Signed)
Patients BP remained elevated during the shift despite interventions, covering practitioner notified

## 2020-12-01 NOTE — TOC Progression Note (Signed)
Transition of Care [facility]) - Progression Note    Patient Details  Name: Susan House MRN: [id number] Date of Birth: [birth year]  [facility]) CM/SW [place], RN Phone Number: 12/01/2020, 9:09 AM  Clinical Narrative:   Patient has bed offers, and patient and spouse asked TOC to contact daughter.  Message left for daughter, awaiting response.        Expected Discharge Plan and Services                                                 Social Determinants of Health (SDOH) Interventions    Readmission Risk Interventions No flowsheet data found.

## 2020-12-01 NOTE — Plan of Care (Signed)
  Problem: Clinical Measurements: Goal: Diagnostic test results will improve Outcome: Progressing Goal: Respiratory complications will improve Outcome: Progressing   Problem: Elimination: Goal: Will not experience complications related to urinary retention Outcome: Progressing   Problem: Pain Managment: Goal: General experience of comfort will improve Outcome: Progressing   Problem: Safety: Goal: Ability to remain free from injury will improve Outcome: Progressing   Problem: Skin Integrity: Goal: Risk for impaired skin integrity will decrease Outcome: Not Progressing

## 2020-12-01 NOTE — Progress Notes (Signed)
Inpatient Diabetes Program Recommendations  AACE/ADA: New Consensus Statement on Inpatient Glycemic Control   Target Ranges:  Prepandial:   less than 140 mg/dL      Peak postprandial:   less than 180 mg/dL (1-2 hours)      Critically ill patients:  140 - 180 mg/dL   Results for [name], Susan House" (MRN [id number]) as of 12/01/2020 11:18  Ref. Range 11/30/2020 07:50 11/30/2020 11:48 11/30/2020 16:08 11/30/2020 21:16 12/01/2020 07:26  Glucose-Capillary Latest Ref Range: 70 - 99 mg/dL 203 (H) 230 (H) 164 (H) 238 (H) 224 (H)   Review of Glycemic Control  Diabetes history: DM2 Outpatient Diabetes medications: Tresiba 30 units daily, Metformin 1000 mg BID Current orders for Inpatient glycemic control: Lantus 8 units QHS, Novolog 0-20 units TID with meals, Novolog 0-5 units QHS  Inpatient Diabetes Program Recommendations:    Insulin: Please consider increasing Lantus to 12 units QHS and adding Novolog 3 units TID with meals for meal coverage if patient eats at least 50% of meals.  Thanks, Barnie Alderman, RN, MSN, CDE Diabetes Coordinator Inpatient Diabetes Program [phone] (Team Pager from 8am to 5pm)

## 2020-12-01 NOTE — TOC Transition Note (Signed)
Transition of Care [facility]) - CM/SW Discharge Note   Patient Details  Name: Susan House MRN: [id number] Date of Birth: May 17, 1956  Transition of Care [facility]) CM/SW Contact:  Pete Pelt, RN Phone Number: 12/01/2020, 11:03 AM   Clinical Narrative:   TOC spoke with daughter, family chooses [facility] practitioner for placement.  Accepted on portal.  Insurance Auth will be started today.           Patient Goals and CMS Choice        Discharge Placement                       Discharge Plan and Services                                     Social Determinants of Health (SDOH) Interventions     Readmission Risk Interventions No flowsheet data found.

## 2020-12-01 NOTE — TOC Progression Note (Signed)
Transition of Care [facility]) - Progression Note    Patient Details  Name: Susan House MRN: [id number] Date of Birth: 09-05-1955  Transition of Care [facility]) CM/SW [place], [place] Phone Number: 12/01/2020, 2:37 PM  Clinical Narrative:     CSW initiated insurance auth with [place], clinicals faxed. CSW informed floor CSW to request updated PT notes for insurance and for that CSW to upload upon note input.   Pending insurance auth for [employer].        Expected Discharge Plan and Services                                                 Social Determinants of Health (SDOH) Interventions    Readmission Risk Interventions No flowsheet data found.

## 2020-12-02 ENCOUNTER — Inpatient Hospital Stay: Payer: Medicare Other

## 2020-12-02 LAB — CBC
HCT: 25.2 % — ABNORMAL LOW (ref 36.0–46.0)
Hemoglobin: 8.5 g/dL — ABNORMAL LOW (ref 12.0–15.0)
MCH: 28.6 pg (ref 26.0–34.0)
MCHC: 33.7 g/dL (ref 30.0–36.0)
MCV: 84.8 fL (ref 80.0–100.0)
Platelets: 208 10*3/uL (ref 150–400)
RBC: 2.97 MIL/uL — ABNORMAL LOW (ref 3.87–5.11)
RDW: 13.8 % (ref 11.5–15.5)
WBC: 6.6 10*3/uL (ref 4.0–10.5)
nRBC: 0 % (ref 0.0–0.2)

## 2020-12-02 LAB — BASIC METABOLIC PANEL
Anion gap: 8 (ref 5–15)
BUN: 47 mg/dL — ABNORMAL HIGH (ref 8–23)
CO2: 19 mmol/L — ABNORMAL LOW (ref 22–32)
Calcium: 7.9 mg/dL — ABNORMAL LOW (ref 8.9–10.3)
Chloride: 112 mmol/L — ABNORMAL HIGH (ref 98–111)
Creatinine, Ser: 2.42 mg/dL — ABNORMAL HIGH (ref 0.44–1.00)
GFR, Estimated: 22 mL/min — ABNORMAL LOW (ref >60)
Glucose, Bld: 157 mg/dL — ABNORMAL HIGH (ref 70–99)
Potassium: 3.9 mmol/L (ref 3.5–5.1)
Sodium: 139 mmol/L (ref 135–145)

## 2020-12-02 LAB — GLUCOSE, CAPILLARY
Glucose-Capillary: 145 mg/dL — ABNORMAL HIGH (ref 70–99)
Glucose-Capillary: 273 mg/dL — ABNORMAL HIGH (ref 70–99)
Glucose-Capillary: 223 mg/dL — ABNORMAL HIGH (ref 70–99)
Glucose-Capillary: 104 mg/dL — ABNORMAL HIGH (ref 70–99)

## 2020-12-02 MED ORDER — HYDRALAZINE HCL 50 MG PO TABS: 50.0000 mg | ORAL_TABLET | Freq: Four times a day (QID) | ORAL | Status: DC

## 2020-12-02 MED ORDER — ENALAPRIL MALEATE 5 MG PO TABS
5.0000 mg | ORAL_TABLET | Freq: Every day | ORAL | Status: DC
  Administered 2020-12-03: 5 mg via ORAL
  Filled 2020-12-02 (×2): qty 1

## 2020-12-02 MED ORDER — COVID-19 MRNA VAC-TRIS(PFIZER) 30 MCG/0.3ML IM SUSP
0.3000 mL | Freq: Once | INTRAMUSCULAR | Status: DC
  Filled 2020-12-02: qty 0.3

## 2020-12-02 MED ORDER — HYDRALAZINE HCL 50 MG PO TABS
50.0000 mg | ORAL_TABLET | Freq: Three times a day (TID) | ORAL | Status: DC
  Administered 2020-12-02 – 2020-12-03 (×3): 50 mg via ORAL
  Filled 2020-12-02 (×3): qty 1

## 2020-12-02 MED ORDER — BISACODYL 5 MG PO TBEC
10.0000 mg | DELAYED_RELEASE_TABLET | Freq: Once | ORAL | Status: AC
  Administered 2020-12-02: 15:00:00 10 mg via ORAL
  Filled 2020-12-02: qty 2

## 2020-12-02 MED ORDER — HYDRALAZINE HCL 50 MG PO TABS
75.0000 mg | ORAL_TABLET | Freq: Three times a day (TID) | ORAL | Status: DC
  Administered 2020-12-02: 09:00:00 75 mg via ORAL
  Filled 2020-12-02: qty 2

## 2020-12-02 MED ORDER — DIPHENHYDRAMINE HCL 50 MG/ML IJ SOLN
12.5000 mg | Freq: Once | INTRAMUSCULAR | Status: AC
  Administered 2020-12-03: 12.5 mg via INTRAVENOUS
  Filled 2020-12-02: qty 1

## 2020-12-02 MED ORDER — POLYETHYLENE GLYCOL 3350 17 G PO PACK
17.0000 g | PACK | Freq: Every day | ORAL | Status: DC
  Administered 2020-12-02 – 2020-12-03 (×2): 17 g via ORAL
  Filled 2020-12-02 (×2): qty 1

## 2020-12-02 NOTE — Progress Notes (Signed)
PROGRESS NOTE    Susan House  NKN:[id number] DOB: 1956-05-02 DOA: 11/28/2020 PCP: Jerrol Banana., MD   Chief Complaint  Patient presents with  . Headache    Brief Narrative:    Brief history admitted with headache and elevated blood pressure and acute kidney injury on chronic kidney disease stage IIIa.  Patient has a past medical history of CVA with left-sided weakness, seizure disorder.  The patient's headache had resolved soon after coming into the hospital.  Family states that the patient has been having more difficulty with transfers recently.  Physical therapy recommending rehab.  Patient having more difficulty with word finding on 11/30/2020 and MRI with no acute findings.   Subjective:  Patient denies any complaints, weakness, shortness of breath or chest pain   Assessment & Plan:   Active Problems:   Essential hypertension   Type 2 diabetes mellitus with complications (HCC)   Type 2 diabetes mellitus with hyperlipidemia (HCC)   Hx of completed stroke   Nonintractable headache   Hypertensive urgency   Acute kidney injury superimposed on chronic kidney disease (HCC)   Obesity, Class III, BMI 40-49.9 (morbid obesity) ([place])   Acute kidney injury ([place])   Diabetic polyneuropathy associated with type 2 diabetes mellitus (HCC)   Word finding difficulty   Acute kidney injury superimposed on CKD ([place])  Acute kidney injury on chronic kidney disease stage IIIa.  -Improving with IV hydration, baseline around 1.76 .  Creatinine 2.74 on presentation improving, it is 2.4 today . -Avoid nephrotoxic medication. -This is most likely due to underlying uncontrolled blood pressure and diabetes mellitus -Renal input greatly appreciated -Renal ultrasound significant with increased echogenicity compatible with medical renal disease  Word finding difficulty today.  -  History of stroke with left-sided weakness.  MRI with no acute finding, but B12 significantly low at  149.  B12 deficiency -Started on IM supplement, can transition to oral on discharge.  History of CVA  -  Continue aspirin, Plavix and atorvastatin.  Essential hypertension. -Difficult to controlled, on Norvasc, Toprol, clonidine, and hydralazine was uptitrated. -We will resume her enalapril at a lower dose today.  Headache has resolved -Acute finding  Weakness.  - Physical therapy recommends rehab.  No signs of infection with urine analysis and checks x-ray again negative.  Seizure disorder  - on Keppra  Type 2 diabetes mellitus with hyperlipidemia on atorvastatin.   Will increase Lantus to 12 units, and add 3 units of NovoLog before meals as well.  Diabetic neuropathy on gabapentin  Memory loss as per family.    RPR and TSH within normal limit, but B12 is significantly low, likely contributing to her symptoms.    Obesity with a BMI of 42.25   DVT prophylaxis: Heparin Code Status: DNR Family Communication: Discussed with husband by phone Disposition:   Status is: Inpatient  Remains inpatient appropriate because:Ongoing diagnostic testing needed not appropriate for outpatient work up   Dispo: The patient is from: Home              Anticipated d/c is to: SNF              Patient currently is not medically stable to d/c.   Difficult to place patient No       Consultants:     Objective: Vitals:   12/02/20 0540 12/02/20 0619 12/02/20 0814 12/02/20 1205  BP: (!) 184/69 (!) 186/76 (!) 158/82 138/89  Pulse:   73 74  Resp:  18 18  Temp:   98.3 F (36.8 C) 98.3 F (36.8 C)  TempSrc:   Oral Oral  SpO2:   98% 100%  Weight:      Height:        Intake/Output Summary (Last 24 hours) at 12/02/2020 1321 Last data filed at 12/02/2020 1233 Gross per 24 hour  Intake 2392.17 ml  Output 2050 ml  Net 342.17 ml   Filed Weights   11/28/20 2237  Weight: 104.8 kg    Examination:  Awake Alert, she is with chronic left-sided weakness Symmetrical Chest wall  movement, Good air movement bilaterally, CTAB RRR,No Gallops,Rubs or new Murmurs, No Parasternal Heave +ve B.Sounds, Abd Soft, No tenderness, No rebound - guarding or rigidity. No Cyanosis, Clubbing or edema, No new Rash or bruise      Data Reviewed: I have personally reviewed following labs and imaging studies  CBC: Recent Labs  Lab 11/28/20 2341 11/30/20 0419 12/02/20 0529  WBC 7.3 7.3 6.6  NEUTROABS 4.4  --   --   HGB 10.0* 9.1* 8.5*  HCT 31.0* 27.5* 25.2*  MCV 88.8 86.8 84.8  PLT 268 240 428    Basic Metabolic Panel: Recent Labs  Lab 11/28/20 2341 11/29/20 1015 11/30/20 0419 12/01/20 0445 12/02/20 0529  NA 141 141 139 136 139  K 4.3 3.6 3.9 3.8 3.9  CL 110 112* 110 108 112*  CO2 19* 20* 21* 20* 19*  GLUCOSE 198* 161* 162* 261* 157*  BUN 54* 49* 47* 50* 47*  CREATININE 2.74* 2.63* 2.32* 2.44* 2.42*  CALCIUM 8.2* 7.9* 7.9* 7.9* 7.9*    GFR: Estimated Creatinine Clearance: 26.7 mL/min (A) (by C-G formula based on SCr of 2.42 mg/dL (H)).  Liver Function Tests: No results for input(s): AST, ALT, ALKPHOS, BILITOT, PROT, ALBUMIN in the last 168 hours.  CBG: Recent Labs  Lab 12/01/20 1129 12/01/20 1601 12/01/20 2042 12/02/20 0812 12/02/20 1204  GLUCAP 312* 235* 115* 145* 273*     Recent Results (from the past 240 hour(s))  Resp Panel by RT-PCR (Flu A&B, Covid) Nasopharyngeal Swab     Status: None   Collection Time: 11/29/20  2:20 AM   Specimen: Nasopharyngeal Swab; Nasopharyngeal(NP) swabs in vial transport medium  Result Value Ref Range Status   SARS Coronavirus 2 by RT PCR NEGATIVE NEGATIVE Final    Comment: (NOTE) SARS-CoV-2 target nucleic acids are NOT DETECTED.  The SARS-CoV-2 RNA is generally detectable in upper respiratory specimens during the acute phase of infection. The lowest concentration of SARS-CoV-2 viral copies this assay can detect is 138 copies/mL. A negative result does not preclude SARS-Cov-2 infection and should not be used as  the sole basis for treatment or other patient management decisions. A negative result may occur with  improper specimen collection/handling, submission of specimen other than nasopharyngeal swab, presence of viral mutation(s) within the areas targeted by this assay, and inadequate number of viral copies(<138 copies/mL). A negative result must be combined with clinical observations, patient history, and epidemiological information. The expected result is Negative.  Fact Sheet for Patients:  EntrepreneurPulse.com.au  Fact Sheet for Healthcare Providers:  IncredibleEmployment.be  This test is no t yet approved or cleared by the [place] FDA and  has been authorized for detection and/or diagnosis of SARS-CoV-2 by FDA under an Emergency Use Authorization (EUA). This EUA will remain  in effect (meaning this test can be used) for the duration of the COVID-19 declaration under Section 564(b)(1) of the Act, 21 U.S.C.section 360bbb-3(b)(1), unless  the authorization is terminated  or revoked sooner.       Influenza A by PCR NEGATIVE NEGATIVE Final   Influenza B by PCR NEGATIVE NEGATIVE Final    Comment: (NOTE) The Xpert Xpress SARS-CoV-2/FLU/RSV plus assay is intended as an aid in the diagnosis of influenza from Nasopharyngeal swab specimens and should not be used as a sole basis for treatment. Nasal washings and aspirates are unacceptable for Xpert Xpress SARS-CoV-2/FLU/RSV testing.  Fact Sheet for Patients: EntrepreneurPulse.com.au  Fact Sheet for Healthcare Providers: IncredibleEmployment.be  This test is not yet approved or cleared by the [place] FDA and has been authorized for detection and/or diagnosis of SARS-CoV-2 by FDA under an Emergency Use Authorization (EUA). This EUA will remain in effect (meaning this test can be used) for the duration of the COVID-19 declaration under Section 564(b)(1) of  the Act, 21 U.S.C. section 360bbb-3(b)(1), unless the authorization is terminated or revoked.  Performed at [facility], [address], [place], [place] [address]          Radiology Studies: MR BRAIN WO CONTRAST  Result Date: 11/30/2020 CLINICAL DATA:  Acute neurologic deficit EXAM: MRI HEAD WITHOUT CONTRAST TECHNIQUE: Multiplanar, multiecho pulse sequences of the brain and surrounding structures were obtained without intravenous contrast. COMPARISON:  05/05/2020 FINDINGS: Brain: No acute infarct, mass effect or extra-axial collection. No acute or chronic hemorrhage. Left frontal encephalomalacia with ex vacuo dilatation of the frontal horn of left lateral ventricle. Generalized volume loss. The midline structures are normal. Vascular: Major flow voids are preserved. Skull and upper cervical spine: Normal calvarium and skull base. Visualized upper cervical spine and soft tissues are normal. Sinuses/Orbits:No paranasal sinus fluid levels or advanced mucosal thickening. No mastoid or middle ear effusion. Normal orbits. IMPRESSION: 1. No acute intracranial abnormality. 2. Multiple old infarcts, left frontal encephalomalacia with ex vacuo dilatation of the frontal horn of the left lateral ventricle. Electronically Signed   By: Ulyses Jarred M.D.   On: 11/30/2020 19:03   US RENAL  Result Date: 12/02/2020 CLINICAL DATA:  Acute kidney injury EXAM: RENAL / URINARY TRACT ULTRASOUND COMPLETE COMPARISON:  10/15/2016 CTA FINDINGS: Right Kidney: Renal measurements: 10.1 x 5.3 x 5.2 cm = volume: 144 mL. slight increased echogenicity compatible with medical renal disease. Mid pole anechoic cortical cyst measures 2.7 cm. No hydronephrosis or acute finding. Left Kidney: Renal measurements: 9.3 x 5.5 x 6.1 cm = volume: 162 mL. Slight increased cortical echogenicity. No focal abnormality or hydronephrosis. No acute finding. Bladder: Appears normal for degree of bladder distention. Left ureteral jet  demonstrated. Other: No free fluid. IMPRESSION: Increased renal echogenicity compatible with medical renal disease. 2.7 cm right kidney midpole cortical cyst No acute finding by ultrasound Electronically Signed   By: Jerilynn Mages.  Shick M.D.   On: 12/02/2020 11:26        Scheduled Meds: . amLODipine  10 mg Oral Daily  . aspirin  81 mg Oral Daily  . atorvastatin  20 mg Oral Daily  . bisacodyl  10 mg Oral Once  . cloNIDine  0.3 mg Oral BID  . clopidogrel  75 mg Oral Daily  . cyanocobalamin  1,000 mcg Intramuscular Daily   Followed by  . [START ON 12/04/2020] cyanocobalamin  1,000 mcg Intramuscular Weekly  . enalapril  5 mg Oral Daily  . enoxaparin (LOVENOX) injection  40 mg Subcutaneous Q24H  . feeding supplement  1 Container Oral Q24H  . gabapentin  200 mg Oral BID  . hydrALAZINE  75 mg  Oral TID  . insulin aspart  0-20 Units Subcutaneous TID WC  . insulin aspart  0-5 Units Subcutaneous QHS  . insulin aspart  3 Units Subcutaneous TID WC  . insulin glargine  12 Units Subcutaneous QHS  . levETIRAcetam  500 mg Oral BID  . metoprolol succinate  100 mg Oral Daily  . pantoprazole  40 mg Oral Daily  . polyethylene glycol  17 g Oral Daily   Continuous Infusions:    LOS: 2 days       Phillips Climes, MD [facility]   To contact the attending provider between 7A-7P or the covering provider during after hours 7P-7A, please log into the web site [email or web address] and access using universal [place] password for that web site. If you do not have the password, please call the hospital operator.  12/02/2020, 1:21 PM

## 2020-12-02 NOTE — Plan of Care (Addendum)
End of Shift Summary:  Alert and oriented x4, intermittent confusion overnight, easily reoriented. Elevated BP PRN med given without change, other VSS. Denies pain or n/v. Repositioned in bed as patient allowed. Urine output adequate. IVF maintained. Remained free from falls or injury. Bed low and in locked position. Bed alarm on and call bell within reach and able to use.   Problem: Education: Goal: Knowledge of General Education information will improve Description: Including pain rating scale, medication(s)/side effects and non-pharmacologic comfort measures Outcome: Progressing   Problem: Health Behavior/Discharge Planning: Goal: Ability to manage health-related needs will improve Outcome: Progressing   Problem: Activity: Goal: Risk for activity intolerance will decrease Outcome: Progressing   Problem: Nutrition: Goal: Adequate nutrition will be maintained Outcome: Progressing   Problem: Coping: Goal: Level of anxiety will decrease Outcome: Progressing   Problem: Safety: Goal: Ability to remain free from injury will improve Outcome: Progressing   Problem: Skin Integrity: Goal: Risk for impaired skin integrity will decrease Outcome: Progressing

## 2020-12-02 NOTE — Progress Notes (Signed)
Physical Therapy Treatment Patient Details Name: Susan House MRN: [id number] DOB: 1955-07-30 Today's Date: 12/02/2020    History of Present Illness Pt is a 65 y.o. female with medical history significant for DM, HTN, CVA with residual left hemiplegia, CKD 3a, morbid obesity who presents to the ER with a 1 day history of throbbing headache in the frontal area associated with nausea.  MD assessment includes: Intractable headache with CT head showing no evidence of acute intracranial hemorrhage or infarct, hypertensive urgency, and AKI on CKD III.    PT Comments    Pt received in bed with spouse at bedside. Denies pain, agreed to PT and increasing mobility.  Pt completed strengthening exercises for B LE's with assist to attain end range on left due to weakness.  ModA of 1 given to transition to EOB with HOB raised.  Pt sat EOB x 5 minutes without c/o dizziness.  BP 138/89,  HR 74bpm.  Good demonstration of unsupported sitting with feet on floor.  Four attempts to attain upright standing at edge of bed with MaxA, pt fearful of falling and had little trust in L LE despite therapist blocking both knees.  Pt will benefit from SNF placement in order to continue progressing mobility and strength.  Possible d/c tomorrow per daughter.  Pt left in bed with call bell in reach, alarm on, and bed in lowest position.    Follow Up Recommendations  SNF;Supervision/Assistance - 24 hour     Equipment Recommendations  None recommended by PT    Recommendations for Other Services       Precautions / Restrictions Precautions Precautions: Fall Restrictions Weight Bearing Restrictions: No    Mobility  Bed Mobility Overal bed mobility: Needs Assistance Bed Mobility: Supine to Sit;Sit to Supine     Supine to sit: Mod assist;HOB elevated (Able to scoot to edge of bed with R UE assist and MinA) Sit to supine: Max assist (for back and LE's)   General bed mobility comments:  (Pt functionally improving  with bed mobility)    Transfers Overall transfer level: Needs assistance Equipment used: None Transfers: Sit to/from Stand Sit to Stand: Max assist (Of 1)         General transfer comment:  (B knees blocked for sit to stand from EOB four times. Pt unable to attain full upright stance due to fear of falling and weakness)  Ambulation/Gait             General Gait Details: Unable   Stairs             Wheelchair Mobility    Modified Rankin (Stroke Patients Only)       Balance Overall balance assessment: Needs assistance Sitting-balance support: No upper extremity supported;Feet supported Sitting balance-Leahy Scale: Good Sitting balance - Comments:  (No LOB while sitting EOB x 5 minutes) Postural control:  (No leaning noted)                                  Cognition Arousal/Alertness: Awake/alert Behavior During Therapy: WFL for tasks assessed/performed Overall Cognitive Status: Within Functional Limits for tasks assessed                                 General Comments:  (occassional memory deficits)      Exercises      General Comments  Pertinent Vitals/Pain Pain Assessment: No/denies pain    Home Living                      Prior Function            PT Goals (current goals can now be found in the care plan section) Acute Rehab PT Goals Patient Stated Goal: To get stronger Progress towards PT goals: Progressing toward goals    Frequency    Min 2X/week      PT Plan Current plan remains appropriate    Co-evaluation              AM-PAC PT "6 Clicks" Mobility   Outcome Measure  Help needed turning from your back to your side while in a flat bed without using bedrails?: Total Help needed moving from lying on your back to sitting on the side of a flat bed without using bedrails?: A Lot Help needed moving to and from a bed to a chair (including a wheelchair)?: Total Help needed  standing up from a chair using your arms (e.g., wheelchair or bedside chair)?: Total Help needed to walk in hospital room?: Total Help needed climbing 3-5 steps with a railing? : Total 6 Click Score: 7    End of Session Equipment Utilized During Treatment: Gait belt Activity Tolerance: Patient tolerated treatment well Patient left: in bed;with call bell/phone within reach;with bed alarm set;with family/visitor present Nurse Communication: Mobility status PT Visit Diagnosis: Unsteadiness on feet (R26.81);Difficulty in walking, not elsewhere classified (R26.2);Muscle weakness (generalized) (M62.81)     Time: 5462-7035 PT Time Calculation (min) (ACUTE ONLY): 55 min  Charges:  $Therapeutic Exercise: 8-22 mins $Therapeutic Activity: 38-52 mins           Mikel Cella, PTA   Susan House 12/02/2020, 12:36 PM

## 2020-12-02 NOTE — Consult Note (Signed)
[facility]  CONSULT NOTE    Date: 12/02/2020                  Patient Name:  Susan House  MRN: [id number]  DOB: [birth year]  Age / Sex: 65 y.o., female         PCP: Jerrol Banana., MD                 Service Requesting Consult: [facility]                 Reason for Consult: AKI on CKD            History of Present Illness: Susan House is a 65 y.o.  female who was admitted to [facility] on 11/28/2020 for intractable headache. Patient has previous medical conditions including diabetes, hypertension, CVA with left hemiplegia residual, morbid obesity and chronic kidney disease stage 3a. She presents to the ED with recent onset headache with nausea.   She is seen today resting in bed with husband at bedside. She states she has had diabetes for more than 30 years. Husbands states she does have some numbness in her feet. Cause of CVA was never discussed with them. Denies NSAID and alcohol use. States she takes all medications as prescribed and maintains doctor's appts. Patient and spouse say primary care hasn't mentioned any renal concerns. Husband states she recently spent 3 months in rehab but has been home for 2 months.    Medications: Outpatient medications: Medications Prior to Admission  Medication Sig Dispense Refill Last Dose  . amLODipine (NORVASC) 10 MG tablet TAKE 1 TABLET BY MOUTH  DAILY 90 tablet 3 11/28/2020 at am  . aspirin 81 MG chewable tablet Chew 1 tablet (81 mg total) by mouth daily.   11/28/2020 at am  . atorvastatin (LIPITOR) 20 MG tablet TAKE 1 TABLET BY MOUTH  DAILY 90 tablet 1 11/28/2020 at Unknown time  . cloNIDine (CATAPRES) 0.3 MG tablet Take 0.3 mg by mouth 2 (two) times daily.   11/28/2020 at Unknown time  . clopidogrel (PLAVIX) 75 MG tablet TAKE 1 TABLET BY MOUTH  DAILY 90 tablet 3 11/28/2020 at Unknown time  . enalapril (VASOTEC) 20 MG tablet TAKE 1 TABLET BY MOUTH  TWICE DAILY 180 tablet 3 11/28/2020 at Unknown time  .  gabapentin (NEURONTIN) 100 MG capsule Take 200 mg by mouth 2 (two) times daily.    11/28/2020 at Unknown time  . hydrALAZINE (APRESOLINE) 100 MG tablet Take 1 tablet (100 mg total) by mouth 2 (two) times daily as needed (for pressure >145). 180 tablet 3 11/28/2020 at Unknown time  . LANTUS SOLOSTAR 100 UNIT/ML Solostar Pen Inject into the skin.   11/28/2020 at Unknown time  . levETIRAcetam (KEPPRA) 500 MG tablet Take 1 tablet (500 mg total) by mouth 2 (two) times daily. 180 tablet 3 11/28/2020 at Unknown time  . metFORMIN (GLUCOPHAGE) 1000 MG tablet TAKE 1 TABLET BY MOUTH  TWICE DAILY 180 tablet 3 11/28/2020 at Unknown time  . metoprolol succinate (TOPROL-XL) 100 MG 24 hr tablet TAKE 1 TABLET BY MOUTH  DAILY 90 tablet 3 11/28/2020 at Unknown time  . nitroGLYCERIN (NITROSTAT) 0.4 MG SL tablet Place 1 tablet (0.4 mg total) under the tongue every 5 (five) minutes as needed for chest pain. 25 tablet 0 unknown at unknown  . pantoprazole (PROTONIX) 40 MG tablet TAKE 1 TABLET BY MOUTH  DAILY 90 tablet 3 11/28/2020 at Unknown time  .  ACCU-CHEK AVIVA PLUS test strip TEST BLOOD SUGAR 3 TIMES  DAILY 300 strip 3   . Dimethicone (CAVILON DURABLE BARRIER) 1.3 % CREA Apply barrier cream PRN 92 g 0   . famotidine (PEPCID) 20 MG tablet Take 1 tablet (20 mg total) by mouth daily. 30 tablet 0   . ondansetron (ZOFRAN) 4 MG tablet Take 1 tablet (4 mg total) by mouth every 8 (eight) hours as needed for nausea or vomiting. 60 tablet 5     Current medications: Current Facility-Administered Medications  Medication Dose Route Frequency Provider Last Rate Last Admin  . acetaminophen (TYLENOL) tablet 650 mg  650 mg Oral Q6H PRN Athena Masse, MD       Or  . acetaminophen (TYLENOL) suppository 650 mg  650 mg Rectal Q6H PRN Athena Masse, MD      . amLODipine (NORVASC) tablet 10 mg  10 mg Oral Daily Athena Masse, MD   10 mg at 12/02/20 1048  . aspirin chewable tablet 81 mg  81 mg Oral Daily Athena Masse, MD   81 mg at  12/02/20 1044  . atorvastatin (LIPITOR) tablet 20 mg  20 mg Oral Daily Judd Gaudier V, MD   20 mg at 12/02/20 1047  . butalbital-acetaminophen-caffeine (FIORICET) 50-325-40 MG per tablet 1 tablet  1 tablet Oral Q4H PRN Athena Masse, MD      . cloNIDine (CATAPRES) tablet 0.3 mg  0.3 mg Oral BID Loletha Grayer, MD   0.3 mg at 12/02/20 1047  . clopidogrel (PLAVIX) tablet 75 mg  75 mg Oral Daily Loletha Grayer, MD   75 mg at 12/02/20 1048  . COVID-19 mRNA Vac-TriS (Pfizer) injection 0.3 mL  0.3 mL Intramuscular Once Elgergawy, Silver Huguenin, MD      . cyanocobalamin ((VITAMIN B-12)) injection 1,000 mcg  1,000 mcg Intramuscular Daily Elgergawy, Silver Huguenin, MD   1,000 mcg at 12/02/20 1051   Followed by  . [START ON 12/04/2020] cyanocobalamin ((VITAMIN B-12)) injection 1,000 mcg  1,000 mcg Intramuscular Weekly Elgergawy, Dawood S, MD      . enalapril (VASOTEC) tablet 5 mg  5 mg Oral Daily Elgergawy, Dawood S, MD      . enoxaparin (LOVENOX) injection 40 mg  40 mg Subcutaneous Q24H Athena Masse, MD   40 mg at 12/02/20 0833  . feeding supplement (BOOST / RESOURCE ) liquid 1 Container  1 Container Oral Q24H Loletha Grayer, MD   1 Container at 12/02/20 1456  . gabapentin (NEURONTIN) capsule 200 mg  200 mg Oral BID Loletha Grayer, MD   200 mg at 12/02/20 1047  . hydrALAZINE (APRESOLINE) injection 5 mg  5 mg Intravenous Q6H PRN Irene Pap N, DO   5 mg at 12/02/20 0540  . hydrALAZINE (APRESOLINE) tablet 50 mg  50 mg Oral TID Elgergawy, Silver Huguenin, MD      . insulin aspart (novoLOG) injection 0-20 Units  0-20 Units Subcutaneous TID WC Athena Masse, MD   11 Units at 12/02/20 1231  . insulin aspart (novoLOG) injection 0-5 Units  0-5 Units Subcutaneous QHS Athena Masse, MD   2 Units at 11/30/20 2147  . insulin aspart (novoLOG) injection 3 Units  3 Units Subcutaneous TID WC Elgergawy, Silver Huguenin, MD   3 Units at 12/02/20 1230  . insulin glargine (LANTUS) injection 12 Units  12 Units Subcutaneous QHS  Elgergawy, Silver Huguenin, MD   12 Units at 12/01/20 2129  . levETIRAcetam (KEPPRA) tablet 500 mg  500  mg Oral BID Athena Masse, MD   500 mg at 12/02/20 1047  . metoprolol succinate (TOPROL-XL) 24 hr tablet 100 mg  100 mg Oral Daily Loletha Grayer, MD   100 mg at 12/02/20 1048  . ondansetron (ZOFRAN) tablet 4 mg  4 mg Oral Q6H PRN Athena Masse, MD       Or  . ondansetron [facility]) injection 4 mg  4 mg Intravenous Q6H PRN Athena Masse, MD      . pantoprazole (PROTONIX) EC tablet 40 mg  40 mg Oral Daily Loletha Grayer, MD   40 mg at 12/02/20 1048  . polyethylene glycol (MIRALAX / GLYCOLAX) packet 17 g  17 g Oral Daily Lu Duffel, [place]   17 g at 12/02/20 1456      Allergies: Allergies  Allergen Reactions  . Morphine And Related Anaphylaxis  . Reglan [Metoclopramide]     Other reaction(s): Unknown Elevated BP  . Simvastatin Other (See Comments)    Muscle pain  . Betadine [Povidone Iodine] Rash  . Tetracyclines & Related Rash    Other reaction(s): Unknown      Past Medical History: Past Medical History:  Diagnosis Date  . Allergy   . Arthritis   . Atypical chest pain    a. Reportedly nl cath in the past; b. 12/2009 MV: No ischemia/infarct. EF 87%.  . Carotid arterial disease ([place])    a. 09/7167 CTA head/neck: LICA 67E, RICA 93-->[facility] rx.  . Cellulitis and abscess of face   . Chronic Dizziness   . Coronary artery calcification seen on CT scan    a. 10/2016 CTA chest: Atherosclerotic calcifications Ao, cors, and prox great vessels.  . Depression   . Diabetes mellitus ([place])   . Diastolic dysfunction    a. 2008 Echo: Nl EF; b. 2011 Echo: Nl EF; c. 06/2016 Echo: EF 55-60%, Gr1DD; d. 03/2018 Echo: EF 55-60%, no rwma, Gr1DD, mod dil LA.  . Edema   . GERD (gastroesophageal reflux disease)   . Headache   . Hematuria   . Hyperlipidemia   . Hypertension   . IBS (irritable bowel syndrome)   . Migraine   . Morbid obesity ([place])   . Nocturia   . Possible Seizure ([place])     a. 10/2016 ? sz. MRI neg for stroke.  . Stroke ([place]) 2000  . TIA (transient ischemic attack) 2010 & 2019   a. 2010; b. 03/2018 Left sided wkns-->MRI neg for stroke. CTA head/neck: Right A2 cut off/occlusion w/ collats extending over R cerebral convexity. LICA 17P, RICA 45, RSubclavian 50->med rx.  . Urgency of micturation   . Urinary frequency   . Urinary incontinence      Past Surgical History: Past Surgical History:  Procedure Laterality Date  . ABDOMINAL HYSTERECTOMY    . ABDOMINOPLASTY    . APPENDECTOMY    . BACK SURGERY     extensive spinal fusion  . carpel tunnel surgery    . CATARACT EXTRACTION     x 2  . CESAREAN SECTION    . CHOLECYSTECTOMY    . COLONOSCOPY  2010  . COLONOSCOPY WITH PROPOFOL N/A 07/11/2016   Procedure: COLONOSCOPY WITH PROPOFOL;  Surgeon: Lucilla Lame, MD;  Location: [facility] ENDOSCOPY;  Service: Endoscopy;  Laterality: N/A;  . COLONOSCOPY WITH PROPOFOL N/A 03/26/2018   Procedure: COLONOSCOPY WITH PROPOFOL;  Surgeon: Lucilla Lame, MD;  Location: [facility] ENDOSCOPY;  Service: Endoscopy;  Laterality: N/A;  . ESOPHAGOGASTRODUODENOSCOPY (EGD) WITH PROPOFOL N/A 03/26/2018  Procedure: ESOPHAGOGASTRODUODENOSCOPY (EGD) WITH PROPOFOL;  Surgeon: Lucilla Lame, MD;  Location: [facility] ENDOSCOPY;  Service: Endoscopy;  Laterality: N/A;  . EYE SURGERY    . HERNIA REPAIR    . SHOULDER SURGERY    . TEE WITHOUT CARDIOVERSION N/A 05/07/2020   Procedure: TRANSESOPHAGEAL ECHOCARDIOGRAM (TEE);  Surgeon: Nelva Bush, MD;  Location: [facility] ORS;  Service: Cardiovascular;  Laterality: N/A;  . TOE SURGERY    . UPPER GI ENDOSCOPY    . WRIST FRACTURE SURGERY Left      Family History: Family History  Problem Relation Age of Onset  . Hyperlipidemia Mother   . Arrhythmia Mother        WPW  . Hypertension Mother   . Rheum arthritis Mother   . Heart disease Mother   . Heart attack Father 49  . Hyperlipidemia Father   . Hypertension Father   . Stroke Father   . Diabetes Father   .  Heart disease Father   . Coronary artery disease Father   . Diabetes Sister   . Hyperlipidemia Sister   . Hypertension Sister   . Depression Sister   . Depression Sister   . Diabetes Sister   . Hypertension Sister   . Hyperlipidemia Sister   . Kidney disease Paternal Grandmother      Social History: Social History   Socioeconomic History  . Marital status: Married    Spouse name: Marcello Moores   . Number of children: 2  . Years of education: 57  . Highest education level: Not on file  Occupational History  . Occupation: Disabled   . Occupation: retired  Tobacco Use  . Smoking status: Never Smoker  . Smokeless tobacco: Never Used  Vaping Use  . Vaping Use: Never used  Substance and Sexual Activity  . Alcohol use: No  . Drug use: No  . Sexual activity: Never  Other Topics Concern  . Not on file  Social History Narrative   Patient lives in [place] at home with husband Marcello Moores.    Patient has 2 children and 2 step children.    Patient has 12+ years of education.    Patient is Disabled since stroke in 2000.   Social Determinants of Health   Financial Resource Strain: Not on file  Food Insecurity: Not on file  Transportation Needs: Not on file  Physical Activity: Not on file  Stress: Not on file  Social Connections: Not on file  Intimate Partner Violence: Not on file     Review of Systems: Review of Systems  Constitutional: Positive for malaise/fatigue.  Gastrointestinal: Positive for nausea.  Neurological: Positive for weakness and headaches.  All other systems reviewed and are negative.   Vital Signs: Blood pressure 138/89, pulse 74, temperature 98.3 F (36.8 C), temperature source Oral, resp. rate 18, height _0  (1.575 m), weight 104.8 kg, SpO2 100 %.  Weight trends: Filed Weights   11/28/20 2237  Weight: 104.8 kg    Physical Exam: General: NAD, laying in bed  Head: Normocephalic, atraumatic. Moist oral mucosal membranes  Eyes: Anicteric  Lungs:   Clear to auscultation, normal breathing effort  Heart: Regular rate and rhythm  Abdomen:  Soft, nontender,   Extremities:  no peripheral edema.  Neurologic: Nonfocal, moving all four extremities  Skin: No lesions        Lab results: Basic Metabolic Panel: Recent Labs  Lab 11/30/20 0419 12/01/20 0445 12/02/20 0529  NA 139 136 139  K 3.9 3.8 3.9  CL 110 108  112*  CO2 21* 20* 19*  GLUCOSE 162* 261* 157*  BUN 47* 50* 47*  CREATININE 2.32* 2.44* 2.42*  CALCIUM 7.9* 7.9* 7.9*    Liver Function Tests: No results for input(s): AST, ALT, ALKPHOS, BILITOT, PROT, ALBUMIN in the last 168 hours. No results for input(s): LIPASE, AMYLASE in the last 168 hours. No results for input(s): AMMONIA in the last 168 hours.  CBC: Recent Labs  Lab 11/28/20 2341 11/30/20 0419 12/02/20 0529  WBC 7.3 7.3 6.6  NEUTROABS 4.4  --   --   HGB 10.0* 9.1* 8.5*  HCT 31.0* 27.5* 25.2*  MCV 88.8 86.8 84.8  PLT 268 240 208    Cardiac Enzymes: No results for input(s): CKTOTAL, CKMB, CKMBINDEX, TROPONINI in the last 168 hours.  BNP: Invalid input(s): POCBNP  CBG: Recent Labs  Lab 12/01/20 1129 12/01/20 1601 12/01/20 2042 12/02/20 0812 12/02/20 1204  GLUCAP 312* 235* 115* 145* 273*    Microbiology: Results for orders placed or performed during the hospital encounter of 11/28/20  Resp Panel by RT-PCR (Flu A&B, Covid) Nasopharyngeal Swab     Status: None   Collection Time: 11/29/20  2:20 AM   Specimen: Nasopharyngeal Swab; Nasopharyngeal(NP) swabs in vial transport medium  Result Value Ref Range Status   SARS Coronavirus 2 by RT PCR NEGATIVE NEGATIVE Final    Comment: (NOTE) SARS-CoV-2 target nucleic acids are NOT DETECTED.  The SARS-CoV-2 RNA is generally detectable in upper respiratory specimens during the acute phase of infection. The lowest concentration of SARS-CoV-2 viral copies this assay can detect is 138 copies/mL. A negative result does not preclude SARS-Cov-2 infection and  should not be used as the sole basis for treatment or other patient management decisions. A negative result may occur with  improper specimen collection/handling, submission of specimen other than nasopharyngeal swab, presence of viral mutation(s) within the areas targeted by this assay, and inadequate number of viral copies(<138 copies/mL). A negative result must be combined with clinical observations, patient history, and epidemiological information. The expected result is Negative.  Fact Sheet for Patients:  EntrepreneurPulse.com.au  Fact Sheet for Healthcare Providers:  IncredibleEmployment.be  This test is no t yet approved or cleared by the [place] FDA and  has been authorized for detection and/or diagnosis of SARS-CoV-2 by FDA under an Emergency Use Authorization (EUA). This EUA will remain  in effect (meaning this test can be used) for the duration of the COVID-19 declaration under Section 564(b)(1) of the Act, 21 U.S.C.section 360bbb-3(b)(1), unless the authorization is terminated  or revoked sooner.       Influenza A by PCR NEGATIVE NEGATIVE Final   Influenza B by PCR NEGATIVE NEGATIVE Final    Comment: (NOTE) The Xpert Xpress SARS-CoV-2/FLU/RSV plus assay is intended as an aid in the diagnosis of influenza from Nasopharyngeal swab specimens and should not be used as a sole basis for treatment. Nasal washings and aspirates are unacceptable for Xpert Xpress SARS-CoV-2/FLU/RSV testing.  Fact Sheet for Patients: EntrepreneurPulse.com.au  Fact Sheet for Healthcare Providers: IncredibleEmployment.be  This test is not yet approved or cleared by the [place] FDA and has been authorized for detection and/or diagnosis of SARS-CoV-2 by FDA under an Emergency Use Authorization (EUA). This EUA will remain in effect (meaning this test can be used) for the duration of the COVID-19 declaration  under Section 564(b)(1) of the Act, 21 U.S.C. section 360bbb-3(b)(1), unless the authorization is terminated or revoked.  Performed at [facility], [place], [place]  [address]     Coagulation Studies: No results for input(s): LABPROT, INR in the last 72 hours.  Urinalysis: Recent Labs    11/30/20 0430  COLORURINE STRAW*  LABSPEC 1.011  PHURINE 5.0  GLUCOSEU 50*  HGBUR NEGATIVE  BILIRUBINUR NEGATIVE  KETONESUR NEGATIVE  PROTEINUR >=300*  NITRITE NEGATIVE  LEUKOCYTESUR NEGATIVE      Imaging: MR BRAIN WO CONTRAST  Result Date: 11/30/2020 CLINICAL DATA:  Acute neurologic deficit EXAM: MRI HEAD WITHOUT CONTRAST TECHNIQUE: Multiplanar, multiecho pulse sequences of the brain and surrounding structures were obtained without intravenous contrast. COMPARISON:  05/05/2020 FINDINGS: Brain: No acute infarct, mass effect or extra-axial collection. No acute or chronic hemorrhage. Left frontal encephalomalacia with ex vacuo dilatation of the frontal horn of left lateral ventricle. Generalized volume loss. The midline structures are normal. Vascular: Major flow voids are preserved. Skull and upper cervical spine: Normal calvarium and skull base. Visualized upper cervical spine and soft tissues are normal. Sinuses/Orbits:No paranasal sinus fluid levels or advanced mucosal thickening. No mastoid or middle ear effusion. Normal orbits. IMPRESSION: 1. No acute intracranial abnormality. 2. Multiple old infarcts, left frontal encephalomalacia with ex vacuo dilatation of the frontal horn of the left lateral ventricle. Electronically Signed   By: Ulyses Jarred M.D.   On: 11/30/2020 19:03   US RENAL  Result Date: 12/02/2020 CLINICAL DATA:  Acute kidney injury EXAM: RENAL / URINARY TRACT ULTRASOUND COMPLETE COMPARISON:  10/15/2016 CTA FINDINGS: Right Kidney: Renal measurements: 10.1 x 5.3 x 5.2 cm = volume: 144 mL. slight increased echogenicity compatible with medical renal disease.  Mid pole anechoic cortical cyst measures 2.7 cm. No hydronephrosis or acute finding. Left Kidney: Renal measurements: 9.3 x 5.5 x 6.1 cm = volume: 162 mL. Slight increased cortical echogenicity. No focal abnormality or hydronephrosis. No acute finding. Bladder: Appears normal for degree of bladder distention. Left ureteral jet demonstrated. Other: No free fluid. IMPRESSION: Increased renal echogenicity compatible with medical renal disease. 2.7 cm right kidney midpole cortical cyst No acute finding by ultrasound Electronically Signed   By: Jerilynn Mages.  Shick M.D.   On: 12/02/2020 11:26      Assessment & Plan: [name] is a 64 y.o.  female who was admitted to [facility] on 11/28/2020 for ntractable headache. Patient has previous medical conditions including diabetes, hypertension, CVA with left hemiplegia residual, morbid obesity and chronic kidney disease stage 3a. She presents to the ED with recent onset headache with nausea.    1. Acute Kidney Injury on chronic kidney disease stage 3B with baseline creatinine 1.76 and GFR of 32 on 05/07/20.  Acute kidney injury secondary to ATN related to nausea and poor intake Chronic kidney disease is secondary to diabetes Current levels 2.42 with eGFR 22 No IV contrast exposure Renal US shows increased renal echogenicity compatible with renal disease, 2.7 cm right kidney cyst No acute need for dialysis at this time Continue conservative management  2. Hypertension: elevated Home regimen includes Amlodipine, Clonidine, Enalapril, Hydralazine, and Metoprolol.  Enalapril held initially Suggest restart at half home regimen  3. Diabetes mellitus type II with chronic kidney disease insulin dependent. Home regimen includes Lantus. Most recent hemoglobin A1c is 8.6 on 11/29/20.  Metformin.  Remains elevated intermittently during admission Will monitor and manage with SSi     LOS: 2   5/19/20223:14 PM

## 2020-12-02 NOTE — Progress Notes (Signed)
Chaplain Maggie visited patient per Order Requisition for spiritual care from nurse. Chaplain offered supportive presence at the bedside. Patient is making a transition to another facility tomorrow and spoke of being hopeful. Patient and Chaplain prayed together. A prayer shawl will be offered to patient should she like to have a symbol of comfort to carry with her during transition.

## 2020-12-02 NOTE — TOC Progression Note (Addendum)
Transition of Care [facility]) - Progression Note    Patient Details  Name: Susan House MRN: [id number] Date of Birth: 08/02/1955  Transition of [facility]) CM/SW [place], RN Phone Number: 12/02/2020, 10:24 AM  Clinical Narrative:   Josem Kaufmann for [facility] per [facility] [phone].  COVID test and booster needed prior to departure.  Care team notified.    Addendum:  Per care team, patient not expected to be ready for discharge until tomorrow at the soonest, Magda Paganini from [facility] aware.       Expected Discharge Plan and Services                                                 Social Determinants of Health (SDOH) Interventions    Readmission Risk Interventions No flowsheet data found.

## 2020-12-03 DIAGNOSIS — I130 Hypertensive heart and chronic kidney disease with heart failure and stage 1 through stage 4 chronic kidney disease, or unspecified chronic kidney disease: Secondary | ICD-10-CM | POA: Diagnosis not present

## 2020-12-03 DIAGNOSIS — I639 Cerebral infarction, unspecified: Secondary | ICD-10-CM | POA: Diagnosis not present

## 2020-12-03 DIAGNOSIS — I69320 Aphasia following cerebral infarction: Secondary | ICD-10-CM | POA: Diagnosis not present

## 2020-12-03 DIAGNOSIS — I69998 Other sequelae following unspecified cerebrovascular disease: Secondary | ICD-10-CM | POA: Diagnosis not present

## 2020-12-03 DIAGNOSIS — E1151 Type 2 diabetes mellitus with diabetic peripheral angiopathy without gangrene: Secondary | ICD-10-CM | POA: Diagnosis not present

## 2020-12-03 DIAGNOSIS — E1010 Type 1 diabetes mellitus with ketoacidosis without coma: Secondary | ICD-10-CM | POA: Diagnosis not present

## 2020-12-03 DIAGNOSIS — E1140 Type 2 diabetes mellitus with diabetic neuropathy, unspecified: Secondary | ICD-10-CM | POA: Diagnosis not present

## 2020-12-03 DIAGNOSIS — R519 Headache, unspecified: Secondary | ICD-10-CM | POA: Diagnosis not present

## 2020-12-03 DIAGNOSIS — M1990 Unspecified osteoarthritis, unspecified site: Secondary | ICD-10-CM | POA: Diagnosis not present

## 2020-12-03 DIAGNOSIS — E538 Deficiency of other specified B group vitamins: Secondary | ICD-10-CM | POA: Diagnosis not present

## 2020-12-03 DIAGNOSIS — Z794 Long term (current) use of insulin: Secondary | ICD-10-CM | POA: Diagnosis not present

## 2020-12-03 DIAGNOSIS — I69398 Other sequelae of cerebral infarction: Secondary | ICD-10-CM | POA: Diagnosis not present

## 2020-12-03 DIAGNOSIS — Z862 Personal history of diseases of the blood and blood-forming organs and certain disorders involving the immune mechanism: Secondary | ICD-10-CM | POA: Diagnosis not present

## 2020-12-03 DIAGNOSIS — F32A Depression, unspecified: Secondary | ICD-10-CM | POA: Diagnosis not present

## 2020-12-03 DIAGNOSIS — N3281 Overactive bladder: Secondary | ICD-10-CM | POA: Diagnosis not present

## 2020-12-03 DIAGNOSIS — D631 Anemia in chronic kidney disease: Secondary | ICD-10-CM | POA: Diagnosis not present

## 2020-12-03 DIAGNOSIS — I10 Essential (primary) hypertension: Secondary | ICD-10-CM | POA: Diagnosis not present

## 2020-12-03 DIAGNOSIS — E1142 Type 2 diabetes mellitus with diabetic polyneuropathy: Secondary | ICD-10-CM | POA: Diagnosis not present

## 2020-12-03 DIAGNOSIS — G40909 Epilepsy, unspecified, not intractable, without status epilepticus: Secondary | ICD-10-CM | POA: Diagnosis present

## 2020-12-03 DIAGNOSIS — H0589 Other disorders of orbit: Secondary | ICD-10-CM | POA: Diagnosis not present

## 2020-12-03 DIAGNOSIS — E11319 Type 2 diabetes mellitus with unspecified diabetic retinopathy without macular edema: Secondary | ICD-10-CM | POA: Diagnosis not present

## 2020-12-03 DIAGNOSIS — E1165 Type 2 diabetes mellitus with hyperglycemia: Secondary | ICD-10-CM | POA: Diagnosis not present

## 2020-12-03 DIAGNOSIS — Z6841 Body Mass Index (BMI) 40.0 and over, adult: Secondary | ICD-10-CM | POA: Diagnosis not present

## 2020-12-03 DIAGNOSIS — M6281 Muscle weakness (generalized): Secondary | ICD-10-CM | POA: Diagnosis not present

## 2020-12-03 DIAGNOSIS — Z66 Do not resuscitate: Secondary | ICD-10-CM | POA: Diagnosis not present

## 2020-12-03 DIAGNOSIS — K589 Irritable bowel syndrome without diarrhea: Secondary | ICD-10-CM | POA: Diagnosis not present

## 2020-12-03 DIAGNOSIS — D649 Anemia, unspecified: Secondary | ICD-10-CM | POA: Diagnosis not present

## 2020-12-03 DIAGNOSIS — I2510 Atherosclerotic heart disease of native coronary artery without angina pectoris: Secondary | ICD-10-CM | POA: Diagnosis not present

## 2020-12-03 DIAGNOSIS — I120 Hypertensive chronic kidney disease with stage 5 chronic kidney disease or end stage renal disease: Secondary | ICD-10-CM | POA: Diagnosis not present

## 2020-12-03 DIAGNOSIS — I69359 Hemiplegia and hemiparesis following cerebral infarction affecting unspecified side: Secondary | ICD-10-CM | POA: Diagnosis not present

## 2020-12-03 DIAGNOSIS — E1169 Type 2 diabetes mellitus with other specified complication: Secondary | ICD-10-CM | POA: Diagnosis not present

## 2020-12-03 DIAGNOSIS — N189 Chronic kidney disease, unspecified: Secondary | ICD-10-CM | POA: Diagnosis not present

## 2020-12-03 DIAGNOSIS — N1831 Chronic kidney disease, stage 3a: Secondary | ICD-10-CM | POA: Diagnosis not present

## 2020-12-03 DIAGNOSIS — R21 Rash and other nonspecific skin eruption: Secondary | ICD-10-CM | POA: Diagnosis not present

## 2020-12-03 DIAGNOSIS — N1832 Chronic kidney disease, stage 3b: Secondary | ICD-10-CM | POA: Diagnosis not present

## 2020-12-03 DIAGNOSIS — Z743 Need for continuous supervision: Secondary | ICD-10-CM | POA: Diagnosis not present

## 2020-12-03 DIAGNOSIS — Z515 Encounter for palliative care: Secondary | ICD-10-CM | POA: Diagnosis not present

## 2020-12-03 DIAGNOSIS — G9341 Metabolic encephalopathy: Secondary | ICD-10-CM | POA: Diagnosis not present

## 2020-12-03 DIAGNOSIS — I6529 Occlusion and stenosis of unspecified carotid artery: Secondary | ICD-10-CM | POA: Diagnosis not present

## 2020-12-03 DIAGNOSIS — Z8673 Personal history of transient ischemic attack (TIA), and cerebral infarction without residual deficits: Secondary | ICD-10-CM | POA: Diagnosis not present

## 2020-12-03 DIAGNOSIS — R531 Weakness: Secondary | ICD-10-CM | POA: Diagnosis not present

## 2020-12-03 DIAGNOSIS — E1159 Type 2 diabetes mellitus with other circulatory complications: Secondary | ICD-10-CM | POA: Diagnosis not present

## 2020-12-03 DIAGNOSIS — N186 End stage renal disease: Secondary | ICD-10-CM | POA: Diagnosis not present

## 2020-12-03 DIAGNOSIS — E43 Unspecified severe protein-calorie malnutrition: Secondary | ICD-10-CM | POA: Diagnosis not present

## 2020-12-03 DIAGNOSIS — R5381 Other malaise: Secondary | ICD-10-CM | POA: Diagnosis not present

## 2020-12-03 DIAGNOSIS — R1312 Dysphagia, oropharyngeal phase: Secondary | ICD-10-CM | POA: Diagnosis not present

## 2020-12-03 DIAGNOSIS — I6381 Other cerebral infarction due to occlusion or stenosis of small artery: Secondary | ICD-10-CM | POA: Diagnosis not present

## 2020-12-03 DIAGNOSIS — N170 Acute kidney failure with tubular necrosis: Secondary | ICD-10-CM | POA: Diagnosis not present

## 2020-12-03 DIAGNOSIS — L03818 Cellulitis of other sites: Secondary | ICD-10-CM | POA: Diagnosis not present

## 2020-12-03 DIAGNOSIS — R569 Unspecified convulsions: Secondary | ICD-10-CM | POA: Diagnosis not present

## 2020-12-03 DIAGNOSIS — Z7189 Other specified counseling: Secondary | ICD-10-CM | POA: Diagnosis not present

## 2020-12-03 DIAGNOSIS — R279 Unspecified lack of coordination: Secondary | ICD-10-CM | POA: Diagnosis not present

## 2020-12-03 DIAGNOSIS — E1122 Type 2 diabetes mellitus with diabetic chronic kidney disease: Secondary | ICD-10-CM | POA: Diagnosis not present

## 2020-12-03 DIAGNOSIS — I69354 Hemiplegia and hemiparesis following cerebral infarction affecting left non-dominant side: Secondary | ICD-10-CM | POA: Diagnosis not present

## 2020-12-03 DIAGNOSIS — Z7982 Long term (current) use of aspirin: Secondary | ICD-10-CM | POA: Diagnosis not present

## 2020-12-03 DIAGNOSIS — K219 Gastro-esophageal reflux disease without esophagitis: Secondary | ICD-10-CM | POA: Diagnosis not present

## 2020-12-03 DIAGNOSIS — G9340 Encephalopathy, unspecified: Secondary | ICD-10-CM | POA: Diagnosis not present

## 2020-12-03 DIAGNOSIS — I69393 Ataxia following cerebral infarction: Secondary | ICD-10-CM | POA: Diagnosis not present

## 2020-12-03 DIAGNOSIS — E039 Hypothyroidism, unspecified: Secondary | ICD-10-CM | POA: Diagnosis not present

## 2020-12-03 DIAGNOSIS — I152 Hypertension secondary to endocrine disorders: Secondary | ICD-10-CM | POA: Diagnosis not present

## 2020-12-03 DIAGNOSIS — N179 Acute kidney failure, unspecified: Secondary | ICD-10-CM | POA: Diagnosis not present

## 2020-12-03 DIAGNOSIS — Z20822 Contact with and (suspected) exposure to covid-19: Secondary | ICD-10-CM | POA: Diagnosis not present

## 2020-12-03 DIAGNOSIS — J309 Allergic rhinitis, unspecified: Secondary | ICD-10-CM | POA: Diagnosis not present

## 2020-12-03 DIAGNOSIS — G2581 Restless legs syndrome: Secondary | ICD-10-CM | POA: Diagnosis not present

## 2020-12-03 DIAGNOSIS — E785 Hyperlipidemia, unspecified: Secondary | ICD-10-CM | POA: Diagnosis not present

## 2020-12-03 LAB — BASIC METABOLIC PANEL
Anion gap: 7 (ref 5–15)
BUN: 45 mg/dL — ABNORMAL HIGH (ref 8–23)
CO2: 20 mmol/L — ABNORMAL LOW (ref 22–32)
Calcium: 8 mg/dL — ABNORMAL LOW (ref 8.9–10.3)
Chloride: 109 mmol/L (ref 98–111)
Creatinine, Ser: 2.32 mg/dL — ABNORMAL HIGH (ref 0.44–1.00)
GFR, Estimated: 23 mL/min — ABNORMAL LOW (ref >60)
Glucose, Bld: 174 mg/dL — ABNORMAL HIGH (ref 70–99)
Potassium: 4 mmol/L (ref 3.5–5.1)
Sodium: 136 mmol/L (ref 135–145)

## 2020-12-03 LAB — GLUCOSE, CAPILLARY
Glucose-Capillary: 163 mg/dL — ABNORMAL HIGH (ref 70–99)
Glucose-Capillary: 325 mg/dL — ABNORMAL HIGH (ref 70–99)

## 2020-12-03 LAB — MAGNESIUM: Magnesium: 1.4 mg/dL — ABNORMAL LOW (ref 1.7–2.4)

## 2020-12-03 LAB — RESP PANEL BY RT-PCR (FLU A&B, COVID) ARPGX2
Influenza A by PCR: NEGATIVE
Influenza B by PCR: NEGATIVE
SARS Coronavirus 2 by RT PCR: NEGATIVE

## 2020-12-03 MED ORDER — LABETALOL HCL 5 MG/ML IV SOLN
5.0000 mg | INTRAVENOUS | Status: DC | PRN
  Filled 2020-12-03: qty 4

## 2020-12-03 MED ORDER — ACETAMINOPHEN 325 MG PO TABS: 650.0000 mg | ORAL_TABLET | Freq: Four times a day (QID) | ORAL | Status: AC | PRN

## 2020-12-03 MED ORDER — COVID-19 MRNA VAC-TRIS(PFIZER) 30 MCG/0.3ML IM SUSP
0.3000 mL | Freq: Once | INTRAMUSCULAR | Status: AC
  Administered 2020-12-03: 0.3 mL via INTRAMUSCULAR
  Filled 2020-12-03: qty 0.3

## 2020-12-03 MED ORDER — INSULIN ASPART 100 UNIT/ML IJ SOLN: 0.0000 [IU] | Freq: Three times a day (TID) | INTRAMUSCULAR | 11 refills | Status: DC

## 2020-12-03 MED ORDER — CYANOCOBALAMIN 250 MCG PO TABS: 250.0000 ug | ORAL_TABLET | Freq: Every day | ORAL | Status: AC

## 2020-12-03 MED ORDER — INSULIN GLARGINE 100 UNIT/ML ~~LOC~~ SOLN: 12.0000 [IU] | Freq: Every day | SUBCUTANEOUS | 11 refills | Status: DC

## 2020-12-03 MED ORDER — HYDRALAZINE HCL 50 MG PO TABS: 75.0000 mg | ORAL_TABLET | Freq: Three times a day (TID) | ORAL | Status: DC

## 2020-12-03 MED ORDER — MAGNESIUM SULFATE 2 GM/50ML IV SOLN
2.0000 g | Freq: Once | INTRAVENOUS | Status: AC
  Administered 2020-12-03: 10:00:00 2 g via INTRAVENOUS
  Filled 2020-12-03: qty 50

## 2020-12-03 MED ORDER — ENALAPRIL MALEATE 5 MG PO TABS: 5.0000 mg | ORAL_TABLET | Freq: Every day | ORAL | Status: DC

## 2020-12-03 NOTE — Care Management Important Message (Signed)
Important Message  Patient Details  Name: Susan House MRN: [id number] Date of Birth: [birth year]   Medicare Important Message Given:  N/A - LOS <3 / Initial given by admissions     Dannette Barbara 12/03/2020, 1:21 PM

## 2020-12-03 NOTE — Progress Notes (Signed)
[facility]  ROUNDING NOTE   Subjective:   Patient seen resting in bed with husband at bedside Alert and oriented Continues to tolerate meals with nausea Denies shortness of breath States she will be discharged today to rehab  Objective:  Vital signs in last 24 hours:  Temp:  [97.7 F (36.5 C)-98.6 F (37 C)] 98 F (36.7 C) (05/20 0754) Pulse Rate:  [66-77] 66 (05/20 0754) Resp:  [14-18] 14 (05/20 0754) BP: (138-176)/(53-89) 149/53 (05/20 0754) SpO2:  [98 %-100 %] 98 % (05/20 0754)  Weight change:  Filed Weights   11/28/20 2237  Weight: 104.8 kg    Intake/Output: I/O last 3 completed shifts: In: 1695.1 [P.O.:500; I.V.:1195.1] Out: 2850 [Urine:2850]   Intake/Output this shift:  No intake/output data recorded.  Physical Exam: General: NAD, laying in bed  Head: Normocephalic, atraumatic. Moist oral mucosal membranes  Eyes: Anicteric  Lungs:  Clear to auscultation,normal breathing effort  Heart: Regular rate and rhythm  Abdomen:  Soft, nontender,   Extremities:  no peripheral edema.  Neurologic: Nonfocal, moving all four extremities  Skin: No lesions       Basic Metabolic Panel: Recent Labs  Lab 11/29/20 1015 11/30/20 0419 12/01/20 0445 12/02/20 0529 12/03/20 0700  NA 141 139 136 139 136  K 3.6 3.9 3.8 3.9 4.0  CL 112* 110 108 112* 109  CO2 20* 21* 20* 19* 20*  GLUCOSE 161* 162* 261* 157* 174*  BUN 49* 47* 50* 47* 45*  CREATININE 2.63* 2.32* 2.44* 2.42* 2.32*  CALCIUM 7.9* 7.9* 7.9* 7.9* 8.0*  MG  --   --   --   --  1.4*    Liver Function Tests: No results for input(s): AST, ALT, ALKPHOS, BILITOT, PROT, ALBUMIN in the last 168 hours. No results for input(s): LIPASE, AMYLASE in the last 168 hours. No results for input(s): AMMONIA in the last 168 hours.  CBC: Recent Labs  Lab 11/28/20 2341 11/30/20 0419 12/02/20 0529  WBC 7.3 7.3 6.6  NEUTROABS 4.4  --   --   HGB 10.0* 9.1* 8.5*  HCT 31.0* 27.5* 25.2*  MCV 88.8 86.8 84.8  PLT  268 240 208    Cardiac Enzymes: No results for input(s): CKTOTAL, CKMB, CKMBINDEX, TROPONINI in the last 168 hours.  BNP: Invalid input(s): POCBNP  CBG: Recent Labs  Lab 12/02/20 0812 12/02/20 1204 12/02/20 1622 12/02/20 2041 12/03/20 0753  GLUCAP 145* 273* 223* 104* 163*    Microbiology: Results for orders placed or performed during the hospital encounter of 11/28/20  Resp Panel by RT-PCR (Flu A&B, Covid) Nasopharyngeal Swab     Status: None   Collection Time: 11/29/20  2:20 AM   Specimen: Nasopharyngeal Swab; Nasopharyngeal(NP) swabs in vial transport medium  Result Value Ref Range Status   SARS Coronavirus 2 by RT PCR NEGATIVE NEGATIVE Final    Comment: (NOTE) SARS-CoV-2 target nucleic acids are NOT DETECTED.  The SARS-CoV-2 RNA is generally detectable in upper respiratory specimens during the acute phase of infection. The lowest concentration of SARS-CoV-2 viral copies this assay can detect is 138 copies/mL. A negative result does not preclude SARS-Cov-2 infection and should not be used as the sole basis for treatment or other patient management decisions. A negative result may occur with  improper specimen collection/handling, submission of specimen other than nasopharyngeal swab, presence of viral mutation(s) within the areas targeted by this assay, and inadequate number of viral copies(<138 copies/mL). A negative result must be combined with clinical observations, patient history, and  epidemiological information. The expected result is Negative.  Fact Sheet for Patients:  EntrepreneurPulse.com.au  Fact Sheet for Healthcare Providers:  IncredibleEmployment.be  This test is no t yet approved or cleared by the [place] FDA and  has been authorized for detection and/or diagnosis of SARS-CoV-2 by FDA under an Emergency Use Authorization (EUA). This EUA will remain  in effect (meaning this test can be used) for the duration  of the COVID-19 declaration under Section 564(b)(1) of the Act, 21 U.S.C.section 360bbb-3(b)(1), unless the authorization is terminated  or revoked sooner.       Influenza A by PCR NEGATIVE NEGATIVE Final   Influenza B by PCR NEGATIVE NEGATIVE Final    Comment: (NOTE) The Xpert Xpress SARS-CoV-2/FLU/RSV plus assay is intended as an aid in the diagnosis of influenza from Nasopharyngeal swab specimens and should not be used as a sole basis for treatment. Nasal washings and aspirates are unacceptable for Xpert Xpress SARS-CoV-2/FLU/RSV testing.  Fact Sheet for Patients: EntrepreneurPulse.com.au  Fact Sheet for Healthcare Providers: IncredibleEmployment.be  This test is not yet approved or cleared by the [place] FDA and has been authorized for detection and/or diagnosis of SARS-CoV-2 by FDA under an Emergency Use Authorization (EUA). This EUA will remain in effect (meaning this test can be used) for the duration of the COVID-19 declaration under Section 564(b)(1) of the Act, 21 U.S.C. section 360bbb-3(b)(1), unless the authorization is terminated or revoked.  Performed at [facility], [place], [place] [address]   Resp Panel by RT-PCR (Flu A&B, Covid) Nasopharyngeal Swab     Status: None   Collection Time: 12/03/20 10:00 AM   Specimen: Nasopharyngeal Swab; Nasopharyngeal(NP) swabs in vial transport medium  Result Value Ref Range Status   SARS Coronavirus 2 by RT PCR NEGATIVE NEGATIVE Final    Comment: (NOTE) SARS-CoV-2 target nucleic acids are NOT DETECTED.  The SARS-CoV-2 RNA is generally detectable in upper respiratory specimens during the acute phase of infection. The lowest concentration of SARS-CoV-2 viral copies this assay can detect is 138 copies/mL. A negative result does not preclude SARS-Cov-2 infection and should not be used as the sole basis for treatment or other patient management decisions. A  negative result may occur with  improper specimen collection/handling, submission of specimen other than nasopharyngeal swab, presence of viral mutation(s) within the areas targeted by this assay, and inadequate number of viral copies(<138 copies/mL). A negative result must be combined with clinical observations, patient history, and epidemiological information. The expected result is Negative.  Fact Sheet for Patients:  EntrepreneurPulse.com.au  Fact Sheet for Healthcare Providers:  IncredibleEmployment.be  This test is no t yet approved or cleared by the [place] FDA and  has been authorized for detection and/or diagnosis of SARS-CoV-2 by FDA under an Emergency Use Authorization (EUA). This EUA will remain  in effect (meaning this test can be used) for the duration of the COVID-19 declaration under Section 564(b)(1) of the Act, 21 U.S.C.section 360bbb-3(b)(1), unless the authorization is terminated  or revoked sooner.       Influenza A by PCR NEGATIVE NEGATIVE Final   Influenza B by PCR NEGATIVE NEGATIVE Final    Comment: (NOTE) The Xpert Xpress SARS-CoV-2/FLU/RSV plus assay is intended as an aid in the diagnosis of influenza from Nasopharyngeal swab specimens and should not be used as a sole basis for treatment. Nasal washings and aspirates are unacceptable for Xpert Xpress SARS-CoV-2/FLU/RSV testing.  Fact Sheet for Patients: EntrepreneurPulse.com.au  Fact Sheet for Healthcare Providers:  IncredibleEmployment.be  This test is not yet approved or cleared by the [place] and has been authorized for detection and/or diagnosis of SARS-CoV-2 by FDA under an Emergency Use Authorization (EUA). This EUA will remain in effect (meaning this test can be used) for the duration of the COVID-19 declaration under Section 564(b)(1) of the Act, 21 U.S.C. section 360bbb-3(b)(1), unless the authorization  is terminated or revoked.  Performed at [facility], [address] [place], [place] [address]     Coagulation Studies: No results for input(s): LABPROT, INR in the last 72 hours.  Urinalysis: No results for input(s): COLORURINE, LABSPEC, PHURINE, GLUCOSEU, HGBUR, BILIRUBINUR, KETONESUR, PROTEINUR, UROBILINOGEN, NITRITE, LEUKOCYTESUR in the last 72 hours.  Invalid input(s): APPERANCEUR    Imaging: US RENAL  Result Date: 12/02/2020 CLINICAL DATA:  Acute kidney injury EXAM: RENAL / URINARY TRACT ULTRASOUND COMPLETE COMPARISON:  10/15/2016 CTA FINDINGS: Right Kidney: Renal measurements: 10.1 x 5.3 x 5.2 cm = volume: 144 mL. slight increased echogenicity compatible with medical renal disease. Mid pole anechoic cortical cyst measures 2.7 cm. No hydronephrosis or acute finding. Left Kidney: Renal measurements: 9.3 x 5.5 x 6.1 cm = volume: 162 mL. Slight increased cortical echogenicity. No focal abnormality or hydronephrosis. No acute finding. Bladder: Appears normal for degree of bladder distention. Left ureteral jet demonstrated. Other: No free fluid. IMPRESSION: Increased renal echogenicity compatible with medical renal disease. 2.7 cm right kidney midpole cortical cyst No acute finding by ultrasound Electronically Signed   By: Jerilynn Mages.  Shick M.D.   On: 12/02/2020 11:26     Medications:    . amLODipine  10 mg Oral Daily  . aspirin  81 mg Oral Daily  . atorvastatin  20 mg Oral Daily  . cloNIDine  0.3 mg Oral BID  . clopidogrel  75 mg Oral Daily  . COVID-19 mRNA Vac-TriS (Pfizer)  0.3 mL Intramuscular Once  . [START ON 12/04/2020] cyanocobalamin  1,000 mcg Intramuscular Weekly  . enalapril  5 mg Oral Daily  . enoxaparin (LOVENOX) injection  40 mg Subcutaneous Q24H  . feeding supplement  1 Container Oral Q24H  . gabapentin  200 mg Oral BID  . hydrALAZINE  50 mg Oral TID  . insulin aspart  0-20 Units Subcutaneous TID WC  . insulin aspart  0-5 Units Subcutaneous QHS  . insulin  aspart  3 Units Subcutaneous TID WC  . insulin glargine  12 Units Subcutaneous QHS  . levETIRAcetam  500 mg Oral BID  . metoprolol succinate  100 mg Oral Daily  . pantoprazole  40 mg Oral Daily  . polyethylene glycol  17 g Oral Daily   acetaminophen **OR** acetaminophen, butalbital-acetaminophen-caffeine, hydrALAZINE, labetalol, ondansetron **OR** ondansetron (ZOFRAN) IV  Assessment/ Plan:  [name] is a 65 y.o.  female who was admitted to [facility] on 11/28/2020 for intractable headache. Patient has previous medical conditions including diabetes, hypertension, CVA with left hemiplegia residual, morbid obesity and chronic kidney disease stage 3a. She presents to the ED with recent onset headache with nausea.   1. Acute Kidney Injury on chronic kidney disease stage 3B with baseline creatinine 1.76 and GFR of 32 on 05/07/20.  Acute kidney injury secondary to ATN due to nausea and poor intake Chronic kidney disease is secondary to diabetes No acute need for dialysis Renal function improved slightly today Will schedule follow up at discharge with our office  Lab Results  Component Value Date   CREATININE 2.32 (H) 12/03/2020   CREATININE 2.42 (H)  12/02/2020   CREATININE 2.44 (H) 12/01/2020    Intake/Output Summary (Last 24 hours) at 12/03/2020 1203 Last data filed at 12/02/2020 2343 Gross per 24 hour  Intake 240 ml  Output 1300 ml  Net -1060 ml    2. Hypertension:  Remains elevated but improved from yesterday Home regimen includes Amlodipine, Clonidine, Enalapril, Hydralazine, and Metoprolol.  Restarted Enalapril 5mg  yesterday   3. Diabetes mellitus type II with chronic kidney disease Insulin dependent with Metformin and Lantus prescribed for home Hgb A1c 8.6 on 11/29/20 Managed with SSi    LOS: 3   5/20/202212:03 PM

## 2020-12-03 NOTE — Discharge Instructions (Signed)
Follow with Primary MD Jerrol Banana., MD or SNF physician  Get CBC, CMP, checked  by Primary MD next visit.    Activity: As tolerated with Full fall precautions use walker/cane & assistance as needed   Disposition SNF   Diet: Heart Healthy / carb modified  On your next visit with your primary care physician please Get Medicines reviewed and adjusted.   Please request your Prim.MD to go over all Hospital Tests and Procedure/Radiological results at the follow up, please get all Hospital records sent to your Prim MD by signing hospital release before you go home.   If you experience worsening of your admission symptoms, develop shortness of breath, life threatening emergency, suicidal or homicidal thoughts you must seek medical attention immediately by calling 911 or calling your MD immediately  if symptoms less severe.  You Must read complete instructions/literature along with all the possible adverse reactions/side effects for all the Medicines you take and that have been prescribed to you. Take any new Medicines after you have completely understood and accpet all the possible adverse reactions/side effects.   Do not drive, operating heavy machinery, perform activities at heights, swimming or participation in water activities or provide baby sitting services if your were admitted for syncope or siezures until you have seen by Primary MD or a Neurologist and advised to do so again.  Do not drive when taking Pain medications.    Do not take more than prescribed Pain, Sleep and Anxiety Medications  Special Instructions: If you have smoked or chewed Tobacco  in the last 2 yrs please stop smoking, stop any regular Alcohol  and or any Recreational drug use.  Wear Seat belts while driving.   Please note  You were cared for by a hospitalist during your hospital stay. If you have any questions about your discharge medications or the care you received while you were in the hospital  after you are discharged, you can call the unit and asked to speak with the hospitalist on call if the hospitalist that took care of you is not available. Once you are discharged, your primary care physician will handle any further medical issues. Please note that NO REFILLS for any discharge medications will be authorized once you are discharged, as it is imperative that you return to your primary care physician (or establish a relationship with a primary care physician if you do not have one) for your aftercare needs so that they can reassess your need for medications and monitor your lab values.

## 2020-12-03 NOTE — TOC Transition Note (Signed)
Transition of Care [facility]) - CM/SW Discharge Note   Patient Details  Name: Susan House MRN: [id number] Date of Birth: 1955/08/23  [facility] CM/SW Contact:  Pete Pelt, RN Phone Number: 12/03/2020, 1:46 PM   Clinical Narrative:   Patient to discharge to [facility] today, patient and spouse aware.  Per Magda Paganini, patient is going to room 506.  [facility] will transport patient at 3pm today.          Patient Goals and CMS Choice        Discharge Placement                       Discharge Plan and Services                                     Social Determinants of Health (SDOH) Interventions     Readmission Risk Interventions No flowsheet data found.

## 2020-12-03 NOTE — Discharge Summary (Addendum)
Physician Discharge Summary  Susan House NWG:[id number] DOB: 1956/05/24 DOA: 11/28/2020  PCP: Jerrol Banana., MD  Admit date: 11/28/2020 Discharge date: 12/03/2020  Admitted From: Home Disposition: SNF  Recommendations for Outpatient Follow-up:  1. Please obtain BMP/CBC in one week 2. Check B12 level in 4 to 6 weeks, significantly low at 149.  Home Health:NO Equipment/Devices:No  Discharge Condition:Stable, CODE STATUS: DNR Diet recommendation: Heart Healthy / Carb Modified   Brief/Interim Summary:  Acute kidney injury on chronic kidney disease stage IIIa.  -Improving with IV hydration, baseline around 1.76. Creatinine 2.74 on presentation improving, it is 2.3 today . -Avoid nephrotoxic medication. -Chronic kidney disease most likely due to to underlying uncontrolled blood pressure and diabetes mellitus which has worsened due to poor oral intake and nausea. -Renal input greatly appreciated -Renal ultrasound significant with increased echogenicity compatible with medical renal disease , 2.7 cm right kidney cyst  Word finding difficulty today./Increased confusion(metabolic encephalopathy) - History of stroke with left-sided weakness.  MRI with no acute finding, but B12 significantly low at 149.  B12 deficiency -Started on IM supplement, during hospital stay, she is to be discharged on oral supplement, please recheck level 4 to 6 weeks.  History of CVA - Continue aspirin, Plavix and atorvastatin. -She is with chronic left-sided weakness  Essential hypertension. -Difficult to controlled, on Norvasc, Toprol, clonidine, and hydralazine was uptitrated. -Was resumed on enalapril at a lower dose.  Headache has resolved -No acute finding on MRI brain  Weakness.  -Physical therapy recommends rehab. No signs of infection with urine analysis and checks x-ray again negative.  Seizure disorder  - on Keppra  Type 2 diabetes mellitus with hyperlipidemia on  atorvastatin.   Metformin given worsening renal function, she is to be discharged on Lantus and insulin sliding scale  Diabetic neuropathy on gabapentin  Memory loss as per family.   RPR and TSH within normal limit, but B12 is significantly low, likely contributing to her symptoms.    Hypomagnesemia -Magnesium 1.4 today, she will receive 2 g of IV mag sulfate before discharge  Obesity with a BMI of 42.25  Discharge Diagnoses:  Active Problems:   Essential hypertension   Type 2 diabetes mellitus with complications (HCC)   Type 2 diabetes mellitus with hyperlipidemia (HCC)   Hx of completed stroke   Nonintractable headache   Hypertensive urgency   Acute kidney injury superimposed on chronic kidney disease (HCC)   Obesity, Class III, BMI 40-49.9 (morbid obesity) ([place])   Acute kidney injury ([place])   Diabetic polyneuropathy associated with type 2 diabetes mellitus ([place])   Word finding difficulty   Acute kidney injury superimposed on CKD [facility])    Discharge Instructions  Discharge Instructions    Diet - low sodium heart healthy   Complete by: As directed    Discharge instructions   Complete by: As directed    Follow with Primary MD Jerrol Banana., MD or SNF physician  Get CBC, CMP, checked  by Primary MD next visit.    Activity: As tolerated with Full fall precautions use walker/cane & assistance as needed   Disposition SNF   Diet: Heart Healthy / carb modified  On your next visit with your primary care physician please Get Medicines reviewed and adjusted.   Please request your Prim.MD to go over all Hospital Tests and Procedure/Radiological results at the follow up, please get all Hospital records sent to your Prim MD by signing hospital release before you go home.  If you experience worsening of your admission symptoms, develop shortness of breath, life threatening emergency, suicidal or homicidal thoughts you must seek medical attention immediately by  calling 911 or calling your MD immediately  if symptoms less severe.  You Must read complete instructions/literature along with all the possible adverse reactions/side effects for all the Medicines you take and that have been prescribed to you. Take any new Medicines after you have completely understood and accpet all the possible adverse reactions/side effects.   Do not drive, operating heavy machinery, perform activities at heights, swimming or participation in water activities or provide baby sitting services if your were admitted for syncope or siezures until you have seen by Primary MD or a Neurologist and advised to do so again.  Do not drive when taking Pain medications.    Do not take more than prescribed Pain, Sleep and Anxiety Medications  Special Instructions: If you have smoked or chewed Tobacco  in the last 2 yrs please stop smoking, stop any regular Alcohol  and or any Recreational drug use.  Wear Seat belts while driving.   Please note  You were cared for by a hospitalist during your hospital stay. If you have any questions about your discharge medications or the care you received while you were in the hospital after you are discharged, you can call the unit and asked to speak with the hospitalist on call if the hospitalist that took care of you is not available. Once you are discharged, your primary care physician will handle any further medical issues. Please note that NO REFILLS for any discharge medications will be authorized once you are discharged, as it is imperative that you return to your primary care physician (or establish a relationship with a primary care physician if you do not have one) for your aftercare needs so that they can reassess your need for medications and monitor your lab values.   Increase activity slowly   Complete by: As directed      Allergies as of 12/03/2020      Reactions   Morphine And Related Anaphylaxis   Reglan [metoclopramide]    Other  reaction(s): Unknown Elevated BP   Simvastatin Other (See Comments)   Muscle pain   Betadine [povidone Iodine] Rash   Tetracyclines & Related Rash   Other reaction(s): Unknown      Medication List    STOP taking these medications   Lantus SoloStar 100 UNIT/ML Solostar Pen Generic drug: insulin glargine Replaced by: insulin glargine 100 UNIT/ML injection   metFORMIN 1000 MG tablet Commonly known as: GLUCOPHAGE     TAKE these medications   Accu-Chek Aviva Plus test strip Generic drug: glucose blood TEST BLOOD SUGAR 3 TIMES  DAILY   acetaminophen 325 MG tablet Commonly known as: TYLENOL Take 2 tablets (650 mg total) by mouth every 6 (six) hours as needed for mild pain (or Fever >/= 101).   amLODipine 10 MG tablet Commonly known as: NORVASC TAKE 1 TABLET BY MOUTH  DAILY   aspirin 81 MG chewable tablet Chew 1 tablet (81 mg total) by mouth daily.   atorvastatin 20 MG tablet Commonly known as: LIPITOR TAKE 1 TABLET BY MOUTH  DAILY   Cavilon Durable Barrier 1.3 % Crea Generic drug: Dimethicone Apply barrier cream PRN   cloNIDine 0.3 MG tablet Commonly known as: CATAPRES Take 0.3 mg by mouth 2 (two) times daily.   clopidogrel 75 MG tablet Commonly known as: PLAVIX TAKE 1 TABLET BY MOUTH  DAILY  enalapril 5 MG tablet Commonly known as: VASOTEC Take 1 tablet (5 mg total) by mouth daily. What changed:   medication strength  how much to take  when to take this   famotidine 20 MG tablet Commonly known as: PEPCID Take 1 tablet (20 mg total) by mouth daily.   gabapentin 100 MG capsule Commonly known as: NEURONTIN Take 200 mg by mouth 2 (two) times daily.   hydrALAZINE 50 MG tablet Commonly known as: APRESOLINE Take 1.5 tablets (75 mg total) by mouth 3 (three) times daily. What changed:   medication strength  how much to take  when to take this  reasons to take this   insulin aspart 100 UNIT/ML injection Commonly known as: novoLOG Inject 0-20  Units into the skin 3 (three) times daily with meals.   insulin glargine 100 UNIT/ML injection Commonly known as: LANTUS Inject 0.12 mLs (12 Units total) into the skin at bedtime. Replaces: Lantus SoloStar 100 UNIT/ML Solostar Pen   levETIRAcetam 500 MG tablet Commonly known as: KEPPRA Take 1 tablet (500 mg total) by mouth 2 (two) times daily.   metoprolol succinate 100 MG 24 hr tablet Commonly known as: TOPROL-XL TAKE 1 TABLET BY MOUTH  DAILY   nitroGLYCERIN 0.4 MG SL tablet Commonly known as: NITROSTAT Place 1 tablet (0.4 mg total) under the tongue every 5 (five) minutes as needed for chest pain.   ondansetron 4 MG tablet Commonly known as: Zofran Take 1 tablet (4 mg total) by mouth every 8 (eight) hours as needed for nausea or vomiting.   pantoprazole 40 MG tablet Commonly known as: PROTONIX TAKE 1 TABLET BY MOUTH  DAILY   vitamin B-12 250 MCG tablet Commonly known as: CYANOCOBALAMIN Take 1 tablet (250 mcg total) by mouth daily.       Contact information for after-discharge care    [facility] .   Service: Skilled Nursing Contact information: [place] [phone]                 Allergies  Allergen Reactions  . Morphine And Related Anaphylaxis  . Reglan [Metoclopramide]     Other reaction(s): Unknown Elevated BP  . Simvastatin Other (See Comments)    Muscle pain  . Betadine [Povidone Iodine] Rash  . Tetracyclines & Related Rash    Other reaction(s): Unknown    Consultations:  renal   Procedures/Studies: CT Head Wo Contrast  Result Date: 11/29/2020 CLINICAL DATA:  Headache EXAM: CT HEAD WITHOUT CONTRAST TECHNIQUE: Contiguous axial images were obtained from the base of the skull through the vertex without intravenous contrast. COMPARISON:  CT 05/04/2020, MRI 05/05/2020 FINDINGS: Brain: Moderate parenchymal volume  loss is again identified, slightly advanced in relation to the patient's age, but stable since prior examination. Remote left periventricular white matter and lentiform nuclear infarcts are again identified with ex vacuo dilation of the frontal horn of the left lateral ventricle. Remote infarct involving the posterior limb of the right internal capsule again noted. Mild periventricular white matter changes are present likely reflecting the sequela of small vessel ischemia. No evidence of acute intracranial hemorrhage or infarct. No abnormal mass effect or midline shift. No abnormal intra or extra-axial mass lesion or fluid collection. Borderline ventriculomegaly is stable, likely the sequela of central atrophy. The cerebellum is unremarkable. Asymmetric atrophy involving the cerebral peduncles and pons is again noted. Vascular: No asymmetric hyperdense vasculature at the  skull base. Advanced vascular calcifications are noted within the carotid siphons and distal vertebral arteries bilaterally. Skull: Intact Sinuses/Orbits: Remote right medial orbital wall fracture again identified. The visualized orbits are otherwise unremarkable. The visualized paranasal sinuses are clear. Other: Mastoid air cells and middle ear cavities are clear. IMPRESSION: No evidence of acute intracranial hemorrhage or infarct. Stable remote infarcts and advanced senescent change. Electronically Signed   By: Fidela Salisbury MD   On: 11/29/2020 01:49   MR BRAIN WO CONTRAST  Result Date: 11/30/2020 CLINICAL DATA:  Acute neurologic deficit EXAM: MRI HEAD WITHOUT CONTRAST TECHNIQUE: Multiplanar, multiecho pulse sequences of the brain and surrounding structures were obtained without intravenous contrast. COMPARISON:  05/05/2020 FINDINGS: Brain: No acute infarct, mass effect or extra-axial collection. No acute or chronic hemorrhage. Left frontal encephalomalacia with ex vacuo dilatation of the frontal horn of left lateral ventricle. Generalized  volume loss. The midline structures are normal. Vascular: Major flow voids are preserved. Skull and upper cervical spine: Normal calvarium and skull base. Visualized upper cervical spine and soft tissues are normal. Sinuses/Orbits:No paranasal sinus fluid levels or advanced mucosal thickening. No mastoid or middle ear effusion. Normal orbits. IMPRESSION: 1. No acute intracranial abnormality. 2. Multiple old infarcts, left frontal encephalomalacia with ex vacuo dilatation of the frontal horn of the left lateral ventricle. Electronically Signed   By: Ulyses Jarred M.D.   On: 11/30/2020 19:03   US RENAL  Result Date: 12/02/2020 CLINICAL DATA:  Acute kidney injury EXAM: RENAL / URINARY TRACT ULTRASOUND COMPLETE COMPARISON:  10/15/2016 CTA FINDINGS: Right Kidney: Renal measurements: 10.1 x 5.3 x 5.2 cm = volume: 144 mL. slight increased echogenicity compatible with medical renal disease. Mid pole anechoic cortical cyst measures 2.7 cm. No hydronephrosis or acute finding. Left Kidney: Renal measurements: 9.3 x 5.5 x 6.1 cm = volume: 162 mL. Slight increased cortical echogenicity. No focal abnormality or hydronephrosis. No acute finding. Bladder: Appears normal for degree of bladder distention. Left ureteral jet demonstrated. Other: No free fluid. IMPRESSION: Increased renal echogenicity compatible with medical renal disease. 2.7 cm right kidney midpole cortical cyst No acute finding by ultrasound Electronically Signed   By: Jerilynn Mages.  Shick M.D.   On: 12/02/2020 11:26   DG Chest Port 1 View  Result Date: 11/29/2020 CLINICAL DATA:  Weakness EXAM: PORTABLE CHEST 1 VIEW COMPARISON:  04/27/2020 FINDINGS: Cardiac shadow is stable. Mitral annular calcifications and aortic calcifications are again seen and stable. The lungs are clear. No acute bony abnormality is noted. Old non healed right clavicular fracture is noted. IMPRESSION: No acute abnormality noted. Electronically Signed   By: Inez Catalina M.D.   On: 11/29/2020 17:46        Subjective: He denies any complaints today, no headache, no chest pain, no shortness of breath  Discharge Exam: Vitals:   12/03/20 0415 12/03/20 0754  BP: (!) 150/65 (!) 149/53  Pulse: 69 66  Resp: 16 14  Temp: 98.6 F (37 C) 98 F (36.7 C)  SpO2: 100% 98%   Vitals:   12/03/20 0051 12/03/20 0127 12/03/20 0415 12/03/20 0754  BP: (!) 176/82 (!) 147/69 (!) 150/65 (!) 149/53  Pulse: 75 75 69 66  Resp:   16 14  Temp:   98.6 F (37 C) 98 F (36.7 C)  TempSrc:      SpO2:   100% 98%  Weight:      Height:        General: Pt is alert, awake, not in acute distress Cardiovascular: RRR, S1/S2 +,  no rubs, no gallops Respiratory: CTA bilaterally, no wheezing, no rhonchi Abdominal: Soft, NT, ND, bowel sounds + Extremities: no edema, no cyanosis, chronic left-sided weakness    The results of significant diagnostics from this hospitalization (including imaging, microbiology, ancillary and laboratory) are listed below for reference.     Microbiology: Recent Results (from the past 240 hour(s))  Resp Panel by RT-PCR (Flu A&B, Covid) Nasopharyngeal Swab     Status: None   Collection Time: 11/29/20  2:20 AM   Specimen: Nasopharyngeal Swab; Nasopharyngeal(NP) swabs in vial transport medium  Result Value Ref Range Status   SARS Coronavirus 2 by RT PCR NEGATIVE NEGATIVE Final    Comment: (NOTE) SARS-CoV-2 target nucleic acids are NOT DETECTED.  The SARS-CoV-2 RNA is generally detectable in upper respiratory specimens during the acute phase of infection. The lowest concentration of SARS-CoV-2 viral copies this assay can detect is 138 copies/mL. A negative result does not preclude SARS-Cov-2 infection and should not be used as the sole basis for treatment or other patient management decisions. A negative result may occur with  improper specimen collection/handling, submission of specimen other than nasopharyngeal swab, presence of viral mutation(s) within the areas targeted  by this assay, and inadequate number of viral copies(<138 copies/mL). A negative result must be combined with clinical observations, patient history, and epidemiological information. The expected result is Negative.  Fact Sheet for Patients:  EntrepreneurPulse.com.au  Fact Sheet for Healthcare Providers:  IncredibleEmployment.be  This test is no t yet approved or cleared by the [place] FDA and  has been authorized for detection and/or diagnosis of SARS-CoV-2 by FDA under an Emergency Use Authorization (EUA). This EUA will remain  in effect (meaning this test can be used) for the duration of the COVID-19 declaration under Section 564(b)(1) of the Act, 21 U.S.C.section 360bbb-3(b)(1), unless the authorization is terminated  or revoked sooner.       Influenza A by PCR NEGATIVE NEGATIVE Final   Influenza B by PCR NEGATIVE NEGATIVE Final    Comment: (NOTE) The Xpert Xpress SARS-CoV-2/FLU/RSV plus assay is intended as an aid in the diagnosis of influenza from Nasopharyngeal swab specimens and should not be used as a sole basis for treatment. Nasal washings and aspirates are unacceptable for Xpert Xpress SARS-CoV-2/FLU/RSV testing.  Fact Sheet for Patients: EntrepreneurPulse.com.au  Fact Sheet for Healthcare Providers: IncredibleEmployment.be  This test is not yet approved or cleared by the [place] FDA and has been authorized for detection and/or diagnosis of SARS-CoV-2 by FDA under an Emergency Use Authorization (EUA). This EUA will remain in effect (meaning this test can be used) for the duration of the COVID-19 declaration under Section 564(b)(1) of the Act, 21 U.S.C. section 360bbb-3(b)(1), unless the authorization is terminated or revoked.  Performed at [facility], [address] [place], [place] [address]      Labs: BNP (last 3 results) No results for input(s): BNP in the  last 8760 hours. Basic Metabolic Panel: Recent Labs  Lab 11/29/20 1015 11/30/20 0419 12/01/20 0445 12/02/20 0529 12/03/20 0700  NA 141 139 136 139 136  K 3.6 3.9 3.8 3.9 4.0  CL 112* 110 108 112* 109  CO2 20* 21* 20* 19* 20*  GLUCOSE 161* 162* 261* 157* 174*  BUN 49* 47* 50* 47* 45*  CREATININE 2.63* 2.32* 2.44* 2.42* 2.32*  CALCIUM 7.9* 7.9* 7.9* 7.9* 8.0*  MG  --   --   --   --  1.4*   Liver Function Tests: No results  for input(s): AST, ALT, ALKPHOS, BILITOT, PROT, ALBUMIN in the last 168 hours. No results for input(s): LIPASE, AMYLASE in the last 168 hours. No results for input(s): AMMONIA in the last 168 hours. CBC: Recent Labs  Lab 11/28/20 2341 11/30/20 0419 12/02/20 0529  WBC 7.3 7.3 6.6  NEUTROABS 4.4  --   --   HGB 10.0* 9.1* 8.5*  HCT 31.0* 27.5* 25.2*  MCV 88.8 86.8 84.8  PLT 268 240 208   Cardiac Enzymes: No results for input(s): CKTOTAL, CKMB, CKMBINDEX, TROPONINI in the last 168 hours. BNP: Invalid input(s): POCBNP CBG: Recent Labs  Lab 12/02/20 0812 12/02/20 1204 12/02/20 1622 12/02/20 2041 12/03/20 0753  GLUCAP 145* 273* 223* 104* 163*   D-Dimer No results for input(s): DDIMER in the last 72 hours. Hgb A1c No results for input(s): HGBA1C in the last 72 hours. Lipid Profile No results for input(s): CHOL, HDL, LDLCALC, TRIG, CHOLHDL, LDLDIRECT in the last 72 hours. Thyroid function studies Recent Labs    12/01/20 0445  TSH 2.650   Anemia work up Recent Labs    12/01/20 0445  VITAMINB12 149*   Urinalysis    Component Value Date/Time   COLORURINE STRAW (A) 11/30/2020 0430   APPEARANCEUR HAZY (A) 11/30/2020 0430   APPEARANCEUR Turbid (A) 05/25/2016 1122   LABSPEC 1.011 11/30/2020 0430   LABSPEC 1.014 01/17/2014 1708   PHURINE 5.0 11/30/2020 0430   GLUCOSEU 50 (A) 11/30/2020 0430   GLUCOSEU >=500 01/17/2014 1708   HGBUR NEGATIVE 11/30/2020 0430   BILIRUBINUR NEGATIVE 11/30/2020 0430   BILIRUBINUR Negative 10/30/2019 1632    BILIRUBINUR Negative 05/25/2016 1122   BILIRUBINUR Negative 01/17/2014 1708   KETONESUR NEGATIVE 11/30/2020 0430   PROTEINUR >=300 (A) 11/30/2020 0430   UROBILINOGEN 0.2 10/30/2019 1632   UROBILINOGEN 0.2 01/15/2009 0431   NITRITE NEGATIVE 11/30/2020 0430   LEUKOCYTESUR NEGATIVE 11/30/2020 0430   LEUKOCYTESUR 2+ 01/17/2014 1708   Sepsis Labs Invalid input(s): PROCALCITONIN,  WBC,  LACTICIDVEN Microbiology Recent Results (from the past 240 hour(s))  Resp Panel by RT-PCR (Flu A&B, Covid) Nasopharyngeal Swab     Status: None   Collection Time: 11/29/20  2:20 AM   Specimen: Nasopharyngeal Swab; Nasopharyngeal(NP) swabs in vial transport medium  Result Value Ref Range Status   SARS Coronavirus 2 by RT PCR NEGATIVE NEGATIVE Final    Comment: (NOTE) SARS-CoV-2 target nucleic acids are NOT DETECTED.  The SARS-CoV-2 RNA is generally detectable in upper respiratory specimens during the acute phase of infection. The lowest concentration of SARS-CoV-2 viral copies this assay can detect is 138 copies/mL. A negative result does not preclude SARS-Cov-2 infection and should not be used as the sole basis for treatment or other patient management decisions. A negative result may occur with  improper specimen collection/handling, submission of specimen other than nasopharyngeal swab, presence of viral mutation(s) within the areas targeted by this assay, and inadequate number of viral copies(<138 copies/mL). A negative result must be combined with clinical observations, patient history, and epidemiological information. The expected result is Negative.  Fact Sheet for Patients:  EntrepreneurPulse.com.au  Fact Sheet for Healthcare Providers:  IncredibleEmployment.be  This test is no t yet approved or cleared by the [place] FDA and  has been authorized for detection and/or diagnosis of SARS-CoV-2 by FDA under an Emergency Use Authorization (EUA). This EUA  will remain  in effect (meaning this test can be used) for the duration of the COVID-19 declaration under Section 564(b)(1) of the Act, 21 U.S.C.section 360bbb-3(b)(1), unless the  authorization is terminated  or revoked sooner.       Influenza A by PCR NEGATIVE NEGATIVE Final   Influenza B by PCR NEGATIVE NEGATIVE Final    Comment: (NOTE) The Xpert Xpress SARS-CoV-2/FLU/RSV plus assay is intended as an aid in the diagnosis of influenza from Nasopharyngeal swab specimens and should not be used as a sole basis for treatment. Nasal washings and aspirates are unacceptable for Xpert Xpress SARS-CoV-2/FLU/RSV testing.  Fact Sheet for Patients: EntrepreneurPulse.com.au  Fact Sheet for Healthcare Providers: IncredibleEmployment.be  This test is not yet approved or cleared by the [place] FDA and has been authorized for detection and/or diagnosis of SARS-CoV-2 by FDA under an Emergency Use Authorization (EUA). This EUA will remain in effect (meaning this test can be used) for the duration of the COVID-19 declaration under Section 564(b)(1) of the Act, 21 U.S.C. section 360bbb-3(b)(1), unless the authorization is terminated or revoked.  Performed at [facility], [address] [place], [place] [address]      Time coordinating discharge: Over 30 minutes  SIGNED:   Phillips Climes, MD  [facility] 12/03/2020, 9:12 AM Pager   If 7PM-7AM, please contact night-coverage [email or web address] Password [email or web address]

## 2020-12-05 DIAGNOSIS — M6281 Muscle weakness (generalized): Secondary | ICD-10-CM | POA: Diagnosis not present

## 2020-12-05 DIAGNOSIS — R279 Unspecified lack of coordination: Secondary | ICD-10-CM | POA: Diagnosis not present

## 2020-12-05 DIAGNOSIS — R5381 Other malaise: Secondary | ICD-10-CM | POA: Diagnosis not present

## 2020-12-06 DIAGNOSIS — N1831 Chronic kidney disease, stage 3a: Secondary | ICD-10-CM | POA: Diagnosis not present

## 2020-12-06 DIAGNOSIS — E538 Deficiency of other specified B group vitamins: Secondary | ICD-10-CM | POA: Diagnosis not present

## 2020-12-06 DIAGNOSIS — G9340 Encephalopathy, unspecified: Secondary | ICD-10-CM | POA: Diagnosis not present

## 2020-12-06 DIAGNOSIS — E1122 Type 2 diabetes mellitus with diabetic chronic kidney disease: Secondary | ICD-10-CM | POA: Diagnosis not present

## 2020-12-07 DIAGNOSIS — R569 Unspecified convulsions: Secondary | ICD-10-CM | POA: Diagnosis not present

## 2020-12-07 DIAGNOSIS — E1010 Type 1 diabetes mellitus with ketoacidosis without coma: Secondary | ICD-10-CM | POA: Diagnosis not present

## 2020-12-07 DIAGNOSIS — I10 Essential (primary) hypertension: Secondary | ICD-10-CM | POA: Diagnosis not present

## 2020-12-07 DIAGNOSIS — E785 Hyperlipidemia, unspecified: Secondary | ICD-10-CM | POA: Diagnosis not present

## 2020-12-14 ENCOUNTER — Other Ambulatory Visit: Payer: Self-pay

## 2020-12-14 ENCOUNTER — Observation Stay: Payer: Medicare Other

## 2020-12-14 ENCOUNTER — Inpatient Hospital Stay
Admission: EM | Admit: 2020-12-14 | Discharge: 2021-01-19 | DRG: 673 | Disposition: A | Discharge status: Skilled Nursing Facility | Payer: Medicare Other | Source: Skilled Nursing Facility | Attending: Internal Medicine | Admitting: Internal Medicine

## 2020-12-14 DIAGNOSIS — D631 Anemia in chronic kidney disease: Secondary | ICD-10-CM | POA: Diagnosis not present

## 2020-12-14 DIAGNOSIS — E118 Type 2 diabetes mellitus with unspecified complications: Secondary | ICD-10-CM | POA: Diagnosis present

## 2020-12-14 DIAGNOSIS — Z20822 Contact with and (suspected) exposure to covid-19: Secondary | ICD-10-CM | POA: Diagnosis not present

## 2020-12-14 DIAGNOSIS — I152 Hypertension secondary to endocrine disorders: Secondary | ICD-10-CM | POA: Diagnosis present

## 2020-12-14 DIAGNOSIS — I69320 Aphasia following cerebral infarction: Secondary | ICD-10-CM

## 2020-12-14 DIAGNOSIS — Z8249 Family history of ischemic heart disease and other diseases of the circulatory system: Secondary | ICD-10-CM

## 2020-12-14 DIAGNOSIS — I150 Renovascular hypertension: Secondary | ICD-10-CM

## 2020-12-14 DIAGNOSIS — I69359 Hemiplegia and hemiparesis following cerebral infarction affecting unspecified side: Secondary | ICD-10-CM

## 2020-12-14 DIAGNOSIS — R4182 Altered mental status, unspecified: Secondary | ICD-10-CM

## 2020-12-14 DIAGNOSIS — Z66 Do not resuscitate: Secondary | ICD-10-CM | POA: Diagnosis present

## 2020-12-14 DIAGNOSIS — Z981 Arthrodesis status: Secondary | ICD-10-CM

## 2020-12-14 DIAGNOSIS — E039 Hypothyroidism, unspecified: Secondary | ICD-10-CM | POA: Diagnosis not present

## 2020-12-14 DIAGNOSIS — Z743 Need for continuous supervision: Secondary | ICD-10-CM | POA: Diagnosis not present

## 2020-12-14 DIAGNOSIS — G9341 Metabolic encephalopathy: Secondary | ICD-10-CM | POA: Diagnosis not present

## 2020-12-14 DIAGNOSIS — Z9049 Acquired absence of other specified parts of digestive tract: Secondary | ICD-10-CM

## 2020-12-14 DIAGNOSIS — Z7982 Long term (current) use of aspirin: Secondary | ICD-10-CM

## 2020-12-14 DIAGNOSIS — E43 Unspecified severe protein-calorie malnutrition: Secondary | ICD-10-CM | POA: Diagnosis not present

## 2020-12-14 DIAGNOSIS — Z515 Encounter for palliative care: Secondary | ICD-10-CM | POA: Diagnosis not present

## 2020-12-14 DIAGNOSIS — I69393 Ataxia following cerebral infarction: Secondary | ICD-10-CM | POA: Diagnosis not present

## 2020-12-14 DIAGNOSIS — F0150 Vascular dementia without behavioral disturbance: Secondary | ICD-10-CM | POA: Diagnosis present

## 2020-12-14 DIAGNOSIS — Z881 Allergy status to other antibiotic agents status: Secondary | ICD-10-CM

## 2020-12-14 DIAGNOSIS — M1990 Unspecified osteoarthritis, unspecified site: Secondary | ICD-10-CM | POA: Diagnosis present

## 2020-12-14 DIAGNOSIS — E11319 Type 2 diabetes mellitus with unspecified diabetic retinopathy without macular edema: Secondary | ICD-10-CM | POA: Diagnosis not present

## 2020-12-14 DIAGNOSIS — E785 Hyperlipidemia, unspecified: Secondary | ICD-10-CM | POA: Diagnosis present

## 2020-12-14 DIAGNOSIS — I6529 Occlusion and stenosis of unspecified carotid artery: Secondary | ICD-10-CM | POA: Diagnosis not present

## 2020-12-14 DIAGNOSIS — E1151 Type 2 diabetes mellitus with diabetic peripheral angiopathy without gangrene: Secondary | ICD-10-CM | POA: Diagnosis present

## 2020-12-14 DIAGNOSIS — G2581 Restless legs syndrome: Secondary | ICD-10-CM | POA: Diagnosis present

## 2020-12-14 DIAGNOSIS — Z888 Allergy status to other drugs, medicaments and biological substances status: Secondary | ICD-10-CM

## 2020-12-14 DIAGNOSIS — I120 Hypertensive chronic kidney disease with stage 5 chronic kidney disease or end stage renal disease: Principal | ICD-10-CM | POA: Diagnosis present

## 2020-12-14 DIAGNOSIS — Z7189 Other specified counseling: Secondary | ICD-10-CM | POA: Diagnosis not present

## 2020-12-14 DIAGNOSIS — I639 Cerebral infarction, unspecified: Secondary | ICD-10-CM | POA: Diagnosis not present

## 2020-12-14 DIAGNOSIS — Z8614 Personal history of Methicillin resistant Staphylococcus aureus infection: Secondary | ICD-10-CM

## 2020-12-14 DIAGNOSIS — N186 End stage renal disease: Secondary | ICD-10-CM | POA: Diagnosis not present

## 2020-12-14 DIAGNOSIS — R296 Repeated falls: Secondary | ICD-10-CM | POA: Diagnosis present

## 2020-12-14 DIAGNOSIS — I69354 Hemiplegia and hemiparesis following cerebral infarction affecting left non-dominant side: Secondary | ICD-10-CM

## 2020-12-14 DIAGNOSIS — Z8261 Family history of arthritis: Secondary | ICD-10-CM

## 2020-12-14 DIAGNOSIS — Z79899 Other long term (current) drug therapy: Secondary | ICD-10-CM

## 2020-12-14 DIAGNOSIS — E1169 Type 2 diabetes mellitus with other specified complication: Secondary | ICD-10-CM | POA: Diagnosis present

## 2020-12-14 DIAGNOSIS — I878 Other specified disorders of veins: Secondary | ICD-10-CM | POA: Diagnosis present

## 2020-12-14 DIAGNOSIS — N179 Acute kidney failure, unspecified: Secondary | ICD-10-CM | POA: Diagnosis present

## 2020-12-14 DIAGNOSIS — I10 Essential (primary) hypertension: Secondary | ICD-10-CM | POA: Diagnosis not present

## 2020-12-14 DIAGNOSIS — N390 Urinary tract infection, site not specified: Secondary | ICD-10-CM | POA: Diagnosis present

## 2020-12-14 DIAGNOSIS — Z818 Family history of other mental and behavioral disorders: Secondary | ICD-10-CM

## 2020-12-14 DIAGNOSIS — Z6841 Body Mass Index (BMI) 40.0 and over, adult: Secondary | ICD-10-CM

## 2020-12-14 DIAGNOSIS — F32A Depression, unspecified: Secondary | ICD-10-CM | POA: Diagnosis not present

## 2020-12-14 DIAGNOSIS — Z794 Long term (current) use of insulin: Secondary | ICD-10-CM

## 2020-12-14 DIAGNOSIS — Z7902 Long term (current) use of antithrombotics/antiplatelets: Secondary | ICD-10-CM

## 2020-12-14 DIAGNOSIS — N189 Chronic kidney disease, unspecified: Secondary | ICD-10-CM | POA: Diagnosis present

## 2020-12-14 DIAGNOSIS — I6381 Other cerebral infarction due to occlusion or stenosis of small artery: Secondary | ICD-10-CM | POA: Diagnosis not present

## 2020-12-14 DIAGNOSIS — R21 Rash and other nonspecific skin eruption: Secondary | ICD-10-CM | POA: Diagnosis present

## 2020-12-14 DIAGNOSIS — Z862 Personal history of diseases of the blood and blood-forming organs and certain disorders involving the immune mechanism: Secondary | ICD-10-CM | POA: Diagnosis not present

## 2020-12-14 DIAGNOSIS — E1165 Type 2 diabetes mellitus with hyperglycemia: Secondary | ICD-10-CM | POA: Diagnosis not present

## 2020-12-14 DIAGNOSIS — E1159 Type 2 diabetes mellitus with other circulatory complications: Secondary | ICD-10-CM | POA: Diagnosis present

## 2020-12-14 DIAGNOSIS — R531 Weakness: Secondary | ICD-10-CM | POA: Diagnosis not present

## 2020-12-14 DIAGNOSIS — E538 Deficiency of other specified B group vitamins: Secondary | ICD-10-CM | POA: Diagnosis present

## 2020-12-14 DIAGNOSIS — E1122 Type 2 diabetes mellitus with diabetic chronic kidney disease: Secondary | ICD-10-CM | POA: Diagnosis not present

## 2020-12-14 DIAGNOSIS — E1142 Type 2 diabetes mellitus with diabetic polyneuropathy: Secondary | ICD-10-CM | POA: Diagnosis not present

## 2020-12-14 DIAGNOSIS — Z8673 Personal history of transient ischemic attack (TIA), and cerebral infarction without residual deficits: Secondary | ICD-10-CM

## 2020-12-14 DIAGNOSIS — R5381 Other malaise: Secondary | ICD-10-CM | POA: Diagnosis not present

## 2020-12-14 DIAGNOSIS — Z90710 Acquired absence of both cervix and uterus: Secondary | ICD-10-CM

## 2020-12-14 DIAGNOSIS — Z833 Family history of diabetes mellitus: Secondary | ICD-10-CM

## 2020-12-14 DIAGNOSIS — G40909 Epilepsy, unspecified, not intractable, without status epilepticus: Secondary | ICD-10-CM | POA: Diagnosis present

## 2020-12-14 DIAGNOSIS — D649 Anemia, unspecified: Secondary | ICD-10-CM | POA: Diagnosis not present

## 2020-12-14 DIAGNOSIS — K589 Irritable bowel syndrome without diarrhea: Secondary | ICD-10-CM | POA: Diagnosis present

## 2020-12-14 DIAGNOSIS — Z885 Allergy status to narcotic agent status: Secondary | ICD-10-CM

## 2020-12-14 DIAGNOSIS — H0589 Other disorders of orbit: Secondary | ICD-10-CM | POA: Diagnosis not present

## 2020-12-14 DIAGNOSIS — K5900 Constipation, unspecified: Secondary | ICD-10-CM | POA: Diagnosis present

## 2020-12-14 DIAGNOSIS — Z83438 Family history of other disorder of lipoprotein metabolism and other lipidemia: Secondary | ICD-10-CM

## 2020-12-14 DIAGNOSIS — E876 Hypokalemia: Secondary | ICD-10-CM | POA: Diagnosis present

## 2020-12-14 DIAGNOSIS — K219 Gastro-esophageal reflux disease without esophagitis: Secondary | ICD-10-CM | POA: Diagnosis present

## 2020-12-14 DIAGNOSIS — R42 Dizziness and giddiness: Secondary | ICD-10-CM | POA: Diagnosis present

## 2020-12-14 LAB — BASIC METABOLIC PANEL
Anion gap: 10 (ref 5–15)
BUN: 50 mg/dL — ABNORMAL HIGH (ref 8–23)
CO2: 19 mmol/L — ABNORMAL LOW (ref 22–32)
Calcium: 8.6 mg/dL — ABNORMAL LOW (ref 8.9–10.3)
Chloride: 108 mmol/L (ref 98–111)
Creatinine, Ser: 3.26 mg/dL — ABNORMAL HIGH (ref 0.44–1.00)
GFR, Estimated: 15 mL/min — ABNORMAL LOW (ref >60)
Glucose, Bld: 189 mg/dL — ABNORMAL HIGH (ref 70–99)
Potassium: 4 mmol/L (ref 3.5–5.1)
Sodium: 137 mmol/L (ref 135–145)

## 2020-12-14 LAB — CBG MONITORING, ED: Glucose-Capillary: 180 mg/dL — ABNORMAL HIGH (ref 70–99)

## 2020-12-14 LAB — CBC
HCT: 23.6 % — ABNORMAL LOW (ref 36.0–46.0)
Hemoglobin: 7.8 g/dL — ABNORMAL LOW (ref 12.0–15.0)
MCH: 28.6 pg (ref 26.0–34.0)
MCHC: 33.1 g/dL (ref 30.0–36.0)
MCV: 86.4 fL (ref 80.0–100.0)
Platelets: 226 10*3/uL (ref 150–400)
RBC: 2.73 MIL/uL — ABNORMAL LOW (ref 3.87–5.11)
RDW: 13.9 % (ref 11.5–15.5)
WBC: 8.4 10*3/uL (ref 4.0–10.5)
nRBC: 0 % (ref 0.0–0.2)

## 2020-12-14 LAB — TYPE AND SCREEN
ABO/RH(D): B POS
Antibody Screen: NEGATIVE

## 2020-12-14 MED ORDER — GABAPENTIN 100 MG PO CAPS
200.0000 mg | ORAL_CAPSULE | Freq: Two times a day (BID) | ORAL | Status: DC
  Administered 2020-12-14 – 2020-12-27 (×26): 200 mg via ORAL
  Filled 2020-12-14 (×26): qty 2

## 2020-12-14 MED ORDER — ACETAMINOPHEN 650 MG RE SUPP: 650.0000 mg | Freq: Four times a day (QID) | RECTAL | Status: DC | PRN

## 2020-12-14 MED ORDER — ATORVASTATIN CALCIUM 20 MG PO TABS
20.0000 mg | ORAL_TABLET | Freq: Every day | ORAL | Status: DC
  Administered 2020-12-15 – 2021-01-18 (×34): 20 mg via ORAL
  Filled 2020-12-14 (×34): qty 1

## 2020-12-14 MED ORDER — CLOPIDOGREL BISULFATE 75 MG PO TABS
75.0000 mg | ORAL_TABLET | Freq: Every day | ORAL | Status: DC
  Administered 2020-12-15 – 2021-01-19 (×36): 75 mg via ORAL
  Filled 2020-12-14 (×37): qty 1

## 2020-12-14 MED ORDER — ASPIRIN 81 MG PO CHEW
81.0000 mg | CHEWABLE_TABLET | Freq: Every day | ORAL | Status: DC
  Administered 2020-12-15 – 2021-01-19 (×36): 81 mg via ORAL
  Filled 2020-12-14 (×36): qty 1

## 2020-12-14 MED ORDER — AMLODIPINE BESYLATE 10 MG PO TABS
10.0000 mg | ORAL_TABLET | Freq: Every day | ORAL | Status: DC
  Administered 2020-12-14 – 2020-12-24 (×10): 10 mg via ORAL
  Filled 2020-12-14: qty 1
  Filled 2020-12-14: qty 2
  Filled 2020-12-14 (×10): qty 1

## 2020-12-14 MED ORDER — SENNOSIDES-DOCUSATE SODIUM 8.6-50 MG PO TABS: 1.0000 | ORAL_TABLET | Freq: Every evening | ORAL | Status: DC | PRN

## 2020-12-14 MED ORDER — LEVETIRACETAM 500 MG PO TABS
500.0000 mg | ORAL_TABLET | Freq: Two times a day (BID) | ORAL | Status: DC
  Administered 2020-12-14 – 2020-12-24 (×20): 500 mg via ORAL
  Filled 2020-12-14 (×20): qty 1

## 2020-12-14 MED ORDER — INSULIN GLARGINE 100 UNIT/ML ~~LOC~~ SOLN
12.0000 [IU] | Freq: Every day | SUBCUTANEOUS | Status: DC
  Administered 2020-12-14: 12 [IU] via SUBCUTANEOUS
  Filled 2020-12-14 (×2): qty 0.12

## 2020-12-14 MED ORDER — INSULIN ASPART 100 UNIT/ML IJ SOLN
0.0000 [IU] | Freq: Three times a day (TID) | INTRAMUSCULAR | Status: DC
  Administered 2020-12-15: 5 [IU] via SUBCUTANEOUS
  Administered 2020-12-15 – 2020-12-16 (×3): 3 [IU] via SUBCUTANEOUS
  Administered 2020-12-16: 2 [IU] via SUBCUTANEOUS
  Administered 2020-12-17 – 2020-12-18 (×3): 3 [IU] via SUBCUTANEOUS
  Administered 2020-12-18: 2 [IU] via SUBCUTANEOUS
  Administered 2020-12-18 – 2020-12-19 (×2): 3 [IU] via SUBCUTANEOUS
  Administered 2020-12-19: 2 [IU] via SUBCUTANEOUS
  Administered 2020-12-19: 3 [IU] via SUBCUTANEOUS
  Administered 2020-12-20: 5 [IU] via SUBCUTANEOUS
  Administered 2020-12-20 (×2): 3 [IU] via SUBCUTANEOUS
  Administered 2020-12-20: 5 [IU] via SUBCUTANEOUS
  Administered 2020-12-21 (×2): 3 [IU] via SUBCUTANEOUS
  Administered 2020-12-21: 8 [IU] via SUBCUTANEOUS
  Administered 2020-12-22: 3 [IU] via SUBCUTANEOUS
  Administered 2020-12-22: 5 [IU] via SUBCUTANEOUS
  Administered 2020-12-23 (×2): 3 [IU] via SUBCUTANEOUS
  Administered 2020-12-23: 5 [IU] via SUBCUTANEOUS
  Administered 2020-12-24 (×3): 3 [IU] via SUBCUTANEOUS
  Administered 2020-12-25 (×3): 5 [IU] via SUBCUTANEOUS
  Administered 2020-12-26: 2 [IU] via SUBCUTANEOUS
  Administered 2020-12-26: 5 [IU] via SUBCUTANEOUS
  Administered 2020-12-26: 2 [IU] via SUBCUTANEOUS
  Administered 2020-12-27 – 2020-12-28 (×4): 3 [IU] via SUBCUTANEOUS
  Administered 2020-12-28: 2 [IU] via SUBCUTANEOUS
  Administered 2020-12-28: 5 [IU] via SUBCUTANEOUS
  Administered 2020-12-29 (×2): 8 [IU] via SUBCUTANEOUS
  Administered 2020-12-29: 5 [IU] via SUBCUTANEOUS
  Administered 2020-12-30: 3 [IU] via SUBCUTANEOUS
  Administered 2020-12-30: 5 [IU] via SUBCUTANEOUS
  Administered 2020-12-30: 8 [IU] via SUBCUTANEOUS
  Administered 2020-12-31 (×2): 2 [IU] via SUBCUTANEOUS
  Administered 2021-01-01: 5 [IU] via SUBCUTANEOUS
  Administered 2021-01-01: 11 [IU] via SUBCUTANEOUS
  Administered 2021-01-01: 5 [IU] via SUBCUTANEOUS
  Administered 2021-01-02 (×3): 3 [IU] via SUBCUTANEOUS
  Administered 2021-01-03: 5 [IU] via SUBCUTANEOUS
  Administered 2021-01-03: 3 [IU] via SUBCUTANEOUS
  Administered 2021-01-03: 2 [IU] via SUBCUTANEOUS
  Administered 2021-01-04: 8 [IU] via SUBCUTANEOUS
  Administered 2021-01-04: 5 [IU] via SUBCUTANEOUS
  Administered 2021-01-05: 3 [IU] via SUBCUTANEOUS
  Administered 2021-01-05 – 2021-01-06 (×2): 5 [IU] via SUBCUTANEOUS
  Administered 2021-01-06: 8 [IU] via SUBCUTANEOUS
  Administered 2021-01-07: 11 [IU] via SUBCUTANEOUS
  Administered 2021-01-07 – 2021-01-08 (×2): 5 [IU] via SUBCUTANEOUS
  Administered 2021-01-08 (×2): 3 [IU] via SUBCUTANEOUS
  Filled 2020-12-14 (×65): qty 1

## 2020-12-14 MED ORDER — ONDANSETRON HCL 4 MG/2ML IJ SOLN
4.0000 mg | Freq: Four times a day (QID) | INTRAMUSCULAR | Status: DC | PRN
  Administered 2020-12-15 – 2021-01-09 (×2): 4 mg via INTRAVENOUS
  Filled 2020-12-14 (×2): qty 2

## 2020-12-14 MED ORDER — HYDRALAZINE HCL 50 MG PO TABS
75.0000 mg | ORAL_TABLET | Freq: Three times a day (TID) | ORAL | Status: DC
  Administered 2020-12-14 – 2020-12-16 (×5): 75 mg via ORAL
  Filled 2020-12-14 (×6): qty 1
  Filled 2020-12-14: qty 2

## 2020-12-14 MED ORDER — ONDANSETRON HCL 4 MG PO TABS
4.0000 mg | ORAL_TABLET | Freq: Four times a day (QID) | ORAL | Status: DC | PRN
  Administered 2021-01-06 – 2021-01-15 (×3): 4 mg via ORAL
  Filled 2020-12-14 (×3): qty 1

## 2020-12-14 MED ORDER — PANTOPRAZOLE SODIUM 40 MG PO TBEC
40.0000 mg | DELAYED_RELEASE_TABLET | Freq: Every day | ORAL | Status: DC
  Administered 2020-12-15 – 2021-01-19 (×36): 40 mg via ORAL
  Filled 2020-12-14 (×37): qty 1

## 2020-12-14 MED ORDER — ACETAMINOPHEN 325 MG PO TABS
650.0000 mg | ORAL_TABLET | Freq: Four times a day (QID) | ORAL | Status: DC | PRN
  Administered 2020-12-21 – 2021-01-09 (×5): 650 mg via ORAL
  Filled 2020-12-14 (×8): qty 2

## 2020-12-14 MED ORDER — SODIUM CHLORIDE 0.9 % IV BOLUS
500.0000 mL | Freq: Once | INTRAVENOUS | Status: AC
  Administered 2020-12-14: 500 mL via INTRAVENOUS

## 2020-12-14 MED ORDER — CYANOCOBALAMIN 500 MCG PO TABS
250.0000 ug | ORAL_TABLET | Freq: Every day | ORAL | Status: DC
  Administered 2020-12-15 – 2021-01-19 (×34): 250 ug via ORAL
  Filled 2020-12-14 (×36): qty 1

## 2020-12-14 MED ORDER — CLONIDINE HCL 0.1 MG PO TABS
0.3000 mg | ORAL_TABLET | Freq: Two times a day (BID) | ORAL | Status: DC
  Administered 2020-12-14 – 2021-01-19 (×67): 0.3 mg via ORAL
  Filled 2020-12-14 (×69): qty 3

## 2020-12-14 MED ORDER — HEPARIN SODIUM (PORCINE) 5000 UNIT/ML IJ SOLN
5000.0000 [IU] | Freq: Three times a day (TID) | INTRAMUSCULAR | Status: DC
  Administered 2020-12-14 – 2021-01-19 (×106): 5000 [IU] via SUBCUTANEOUS
  Filled 2020-12-14 (×106): qty 1

## 2020-12-14 MED ORDER — METOPROLOL SUCCINATE ER 100 MG PO TB24
100.0000 mg | ORAL_TABLET | Freq: Every day | ORAL | Status: DC
  Administered 2020-12-15 – 2020-12-17 (×3): 100 mg via ORAL
  Filled 2020-12-14 (×3): qty 1

## 2020-12-14 NOTE — H&P (Signed)
History and Physical    Susan House FVC:[id number] DOB: 1955-11-13 DOA: 12/14/2020  PCP: Jerrol Banana., MD  Patient coming from: [facility]  I have personally briefly reviewed patient's old medical records in [place]  Chief Complaint: Generalized weakness  HPI: Susan House is a 65 y.o. female with medical history significant for history of CVA with residual left-sided weakness, left frontal encephalomalacia, CKD stage IIIb-IV, insulin-dependent type 2 diabetes, hypertension, hyperlipidemia, seizure disorder, and memory loss who presents to the ED for evaluation of generalized weakness and abnormal labs.  Patient recently admitted 11/28/2020-12/03/2020 for AKI on CKD managed with IV fluid hydration.  Renal ultrasound showed increased echogenicity compatible with medical renal disease and a 2.7 cm right kidney cyst.  She was discharged to SNF.  Patient states that she was sent to the ED due to abnormal labs.  Per ED triage documentation she had trending down hemoglobin.  Patient reports good urine output without dysuria.  She denies any subjective fevers, chills, diaphoresis, nausea, vomiting, abdominal pain, diarrhea, chest pain, dyspnea.  She has chronic left-sided weakness due to prior stroke which is unchanged.  She has a chronic rash above her left medial ankle which is unchanged.  She denies any NSAID use.  ED Course:  Initial vitals show BP 146/110, pulse 70, RR 18, temp 97.7 F, SPO2 98% on room air.  Labs show BUN 50, creatinine 3.26, serum glucose 189, sodium 137, potassium 4.0, bicarb 19, WBC 8.4, hemoglobin 7.8, platelets 226,000.  SARS-CoV-2 PCR ordered and pending.  Patient was given 500 cc normal saline and the hospitalist service was consulted to admit for further evaluation and management.  Review of Systems: All systems reviewed and are negative except as documented in history of present illness above.   Past Medical History:   Diagnosis Date  . Allergy   . Arthritis   . Atypical chest pain    a. Reportedly nl cath in the past; b. 12/2009 MV: No ischemia/infarct. EF 87%.  . Carotid arterial disease ([place])    a. 12/3844 CTA head/neck: LICA 65L, RICA 93-->TTS rx.  . Cellulitis and abscess of face   . Chronic Dizziness   . Coronary artery calcification seen on CT scan    a. 10/2016 CTA chest: Atherosclerotic calcifications Ao, cors, and prox great vessels.  . Depression   . Diabetes mellitus ([place])   . Diastolic dysfunction    a. 2008 Echo: Nl EF; b. 2011 Echo: Nl EF; c. 06/2016 Echo: EF 55-60%, Gr1DD; d. 03/2018 Echo: EF 55-60%, no rwma, Gr1DD, mod dil LA.  . Edema   . GERD (gastroesophageal reflux disease)   . Headache   . Hematuria   . Hyperlipidemia   . Hypertension   . IBS (irritable bowel syndrome)   . Migraine   . Morbid obesity ([place])   . Nocturia   . Possible Seizure ([place])    a. 10/2016 ? sz. MRI neg for stroke.  . Stroke ([place]) 2000  . TIA (transient ischemic attack) 2010 & 2019   a. 2010; b. 03/2018 Left sided wkns-->MRI neg for stroke. CTA head/neck: Right A2 cut off/occlusion w/ collats extending over R cerebral convexity. LICA 17B, RICA 45, RSubclavian 50->med rx.  . Urgency of micturation   . Urinary frequency   . Urinary incontinence     Past Surgical History:  Procedure Laterality Date  . ABDOMINAL HYSTERECTOMY    . ABDOMINOPLASTY    . APPENDECTOMY    .  BACK SURGERY     extensive spinal fusion  . carpel tunnel surgery    . CATARACT EXTRACTION     x 2  . CESAREAN SECTION    . CHOLECYSTECTOMY    . COLONOSCOPY  2010  . COLONOSCOPY WITH PROPOFOL N/A 07/11/2016   Procedure: COLONOSCOPY WITH PROPOFOL;  Surgeon: Lucilla Lame, MD;  Location: [facility] ENDOSCOPY;  Service: Endoscopy;  Laterality: N/A;  . COLONOSCOPY WITH PROPOFOL N/A 03/26/2018   Procedure: COLONOSCOPY WITH PROPOFOL;  Surgeon: Lucilla Lame, MD;  Location: [facility] ENDOSCOPY;  Service: Endoscopy;  Laterality: N/A;  .  ESOPHAGOGASTRODUODENOSCOPY (EGD) WITH PROPOFOL N/A 03/26/2018   Procedure: ESOPHAGOGASTRODUODENOSCOPY (EGD) WITH PROPOFOL;  Surgeon: Lucilla Lame, MD;  Location: [facility] ENDOSCOPY;  Service: Endoscopy;  Laterality: N/A;  . EYE SURGERY    . HERNIA REPAIR    . SHOULDER SURGERY    . TEE WITHOUT CARDIOVERSION N/A 05/07/2020   Procedure: TRANSESOPHAGEAL ECHOCARDIOGRAM (TEE);  Surgeon: Nelva Bush, MD;  Location: [facility] ORS;  Service: Cardiovascular;  Laterality: N/A;  . TOE SURGERY    . UPPER GI ENDOSCOPY    . WRIST FRACTURE SURGERY Left     Social History:  reports that she has never smoked. She has never used smokeless tobacco. She reports that she does not drink alcohol and does not use drugs.  Allergies  Allergen Reactions  . Morphine And Related Anaphylaxis  . Reglan [Metoclopramide]     Other reaction(s): Unknown Elevated BP  . Simvastatin Other (See Comments)    Muscle pain  . Betadine [Povidone Iodine] Rash  . Tetracyclines & Related Rash    Other reaction(s): Unknown    Family History  Problem Relation Age of Onset  . Hyperlipidemia Mother   . Arrhythmia Mother        WPW  . Hypertension Mother   . Rheum arthritis Mother   . Heart disease Mother   . Heart attack Father 103  . Hyperlipidemia Father   . Hypertension Father   . Stroke Father   . Diabetes Father   . Heart disease Father   . Coronary artery disease Father   . Diabetes Sister   . Hyperlipidemia Sister   . Hypertension Sister   . Depression Sister   . Depression Sister   . Diabetes Sister   . Hypertension Sister   . Hyperlipidemia Sister   . Kidney disease Paternal Grandmother      Prior to Admission medications   Medication Sig Start Date End Date Taking? Authorizing Provider  ACCU-CHEK AVIVA PLUS test strip TEST BLOOD SUGAR 3 TIMES  DAILY 06/07/20   Jerrol Banana., MD  acetaminophen (TYLENOL) 325 MG tablet Take 2 tablets (650 mg total) by mouth every 6 (six) hours as needed for mild pain  (or Fever >/= 101). 12/03/20   Elgergawy, Silver Huguenin, MD  amLODipine (NORVASC) 10 MG tablet TAKE 1 TABLET BY MOUTH  DAILY 11/09/20   Jerrol Banana., MD  aspirin 81 MG chewable tablet Chew 1 tablet (81 mg total) by mouth daily. 09/27/18   Dustin Flock, MD  atorvastatin (LIPITOR) 20 MG tablet TAKE 1 TABLET BY MOUTH  DAILY 08/06/20   Jerrol Banana., MD  cloNIDine (CATAPRES) 0.3 MG tablet Take 0.3 mg by mouth 2 (two) times daily. 09/14/20   [provider]  clopidogrel (PLAVIX) 75 MG tablet TAKE 1 TABLET BY MOUTH  DAILY 07/19/20   Jerrol Banana., MD  Dimethicone (CAVILON DURABLE BARRIER) 1.3 % CREA Apply barrier  cream PRN 08/19/20   Jerrol Banana., MD  enalapril (VASOTEC) 5 MG tablet Take 1 tablet (5 mg total) by mouth daily. 12/03/20   Elgergawy, Silver Huguenin, MD  famotidine (PEPCID) 20 MG tablet Take 1 tablet (20 mg total) by mouth daily. 05/08/20 06/07/20  Sharen Hones, MD  gabapentin (NEURONTIN) 100 MG capsule Take 200 mg by mouth 2 (two) times daily.  10/14/18   [provider]  hydrALAZINE (APRESOLINE) 50 MG tablet Take 1.5 tablets (75 mg total) by mouth 3 (three) times daily. 12/03/20   Elgergawy, Silver Huguenin, MD  insulin aspart (NOVOLOG) 100 UNIT/ML injection Inject 0-20 Units into the skin 3 (three) times daily with meals. 12/03/20   Elgergawy, Silver Huguenin, MD  insulin glargine (LANTUS) 100 UNIT/ML injection Inject 0.12 mLs (12 Units total) into the skin at bedtime. 12/03/20   Elgergawy, Silver Huguenin, MD  levETIRAcetam (KEPPRA) 500 MG tablet Take 1 tablet (500 mg total) by mouth 2 (two) times daily. 10/01/20   Jerrol Banana., MD  metoprolol succinate (TOPROL-XL) 100 MG 24 hr tablet TAKE 1 TABLET BY MOUTH  DAILY 11/09/20   Jerrol Banana., MD  nitroGLYCERIN (NITROSTAT) 0.4 MG SL tablet Place 1 tablet (0.4 mg total) under the tongue every 5 (five) minutes as needed for chest pain. 10/11/15   Jerrol Banana., MD  ondansetron (ZOFRAN) 4 MG tablet Take 1  tablet (4 mg total) by mouth every 8 (eight) hours as needed for nausea or vomiting. 06/09/19   Chrismon, Vickki Muff, PA-C  pantoprazole (PROTONIX) 40 MG tablet TAKE 1 TABLET BY MOUTH  DAILY 02/21/20   Jerrol Banana., MD  vitamin B-12 (CYANOCOBALAMIN) 250 MCG tablet Take 1 tablet (250 mcg total) by mouth daily. 12/03/20   Elgergawy, Silver Huguenin, MD    Physical Exam: Vitals:   12/14/20 1804  BP: (!) 146/110  Pulse: 70  Resp: 18  Temp: 97.7 F (36.5 C)  TempSrc: Oral  SpO2: 98%   Constitutional: Obese woman resting in bed with head elevated, NAD, calm, comfortable Eyes: PERRL, lids and conjunctivae normal ENMT: Mucous membranes are moist. Posterior pharynx clear of any exudate or lesions.  Neck: normal, supple, no masses. Respiratory: clear to auscultation bilaterally, no wheezing, no crackles. Normal respiratory effort. No accessory muscle use.  Cardiovascular: Regular rate and rhythm, no murmurs / rubs / gallops.  Trace bilateral lower extremity edema. 2+ pedal pulses. Abdomen: no tenderness, no masses palpated. No hepatosplenomegaly. Bowel sounds positive.  Musculoskeletal: no clubbing / cyanosis. No joint deformity upper and lower extremities.  Diminished ROM left upper and lower extremities, contracture left hand. Normal muscle tone.  Skin: Macular rash above medial left ankle, numerous scabbed over scratch marks right leg Neurologic: CN 2-12 grossly intact. Sensation intact. Strength intact right upper and lower extremities, left hemiparesis. Psychiatric: Alert and oriented x 3. Normal mood.   Labs on Admission: I have personally reviewed following labs and imaging studies  CBC: Recent Labs  Lab 12/14/20 1811  WBC 8.4  HGB 7.8*  HCT 23.6*  MCV 86.4  PLT 229   Basic Metabolic Panel: Recent Labs  Lab 12/14/20 1811  NA 137  K 4.0  CL 108  CO2 19*  GLUCOSE 189*  BUN 50*  CREATININE 3.26*  CALCIUM 8.6*   GFR: CrCl cannot be calculated (Unknown ideal  weight.). Liver Function Tests: No results for input(s): AST, ALT, ALKPHOS, BILITOT, PROT, ALBUMIN in the last 168 hours. No results for input(s):  LIPASE, AMYLASE in the last 168 hours. No results for input(s): AMMONIA in the last 168 hours. Coagulation Profile: No results for input(s): INR, PROTIME in the last 168 hours. Cardiac Enzymes: No results for input(s): CKTOTAL, CKMB, CKMBINDEX, TROPONINI in the last 168 hours. BNP (last 3 results) No results for input(s): PROBNP in the last 8760 hours. HbA1C: No results for input(s): HGBA1C in the last 72 hours. CBG: No results for input(s): GLUCAP in the last 168 hours. Lipid Profile: No results for input(s): CHOL, HDL, LDLCALC, TRIG, CHOLHDL, LDLDIRECT in the last 72 hours. Thyroid Function Tests: No results for input(s): TSH, T4TOTAL, FREET4, T3FREE, THYROIDAB in the last 72 hours. Anemia Panel: No results for input(s): VITAMINB12, FOLATE, FERRITIN, TIBC, IRON, RETICCTPCT in the last 72 hours. Urine analysis:    Component Value Date/Time   COLORURINE STRAW (A) 11/30/2020 0430   APPEARANCEUR HAZY (A) 11/30/2020 0430   APPEARANCEUR Turbid (A) 05/25/2016 1122   LABSPEC 1.011 11/30/2020 0430   LABSPEC 1.014 01/17/2014 1708   PHURINE 5.0 11/30/2020 0430   GLUCOSEU 50 (A) 11/30/2020 0430   GLUCOSEU >=500 01/17/2014 1708   HGBUR NEGATIVE 11/30/2020 0430   BILIRUBINUR NEGATIVE 11/30/2020 0430   BILIRUBINUR Negative 10/30/2019 1632   BILIRUBINUR Negative 05/25/2016 1122   BILIRUBINUR Negative 01/17/2014 1708   KETONESUR NEGATIVE 11/30/2020 0430   PROTEINUR >=300 (A) 11/30/2020 0430   UROBILINOGEN 0.2 10/30/2019 1632   UROBILINOGEN 0.2 01/15/2009 0431   NITRITE NEGATIVE 11/30/2020 0430   LEUKOCYTESUR NEGATIVE 11/30/2020 0430   LEUKOCYTESUR 2+ 01/17/2014 1708    Radiological Exams on Admission: No results found.  EKG: Personally reviewed. Sinus rhythm, motion artifact.  Assessment/Plan Principal Problem:   Acute kidney injury  superimposed on chronic kidney disease (HCC) Active Problems:   Hypertension associated with diabetes ([place])   Type 2 diabetes mellitus with complications (HCC)   Hemiparesis due to old cerebrovascular accident [place])   Hyperlipidemia associated with type 2 diabetes mellitus ([place])   Hx of completed stroke   Torria Fromer is a 65 y.o. female with medical history significant for history of CVA with residual left-sided weakness, left frontal encephalomalacia, CKD stage IIIb-IV, insulin-dependent type 2 diabetes, hypertension, hyperlipidemia, seizure disorder, and memory loss who is admitted with AKI on CKD.  AKI on CKD stage IIIb-IV: Recently admitted for the same.  Renal function progressively worsening.  Start IV fluid hydration overnight, check urine studies, hold enalapril, avoid NSAIDs.  Anemia of chronic kidney disease and B12 deficiency: Hemoglobin 7.8.  Patient denies any obvious bleeding.  Continue B12 supplement and monitor for any signs/symptoms of bleeding.  Insulin-dependent type 2 diabetes: A1c 8.6%.  Continue Lantus 12 units nightly and SSI.  History of CVA with residual left-sided weakness/left frontal encephalomalacia: Continue aspirin, Plavix, atorvastatin.  Hypertension: Continue amlodipine, clonidine, hydralazine, Toprol-XL.  Holding enalapril as above.  Seizure disorder: Continue Keppra.  Memory loss: Suspected underlying vascular dementia.  She had some reported confusion/delirium prior to arrival.  CT head pending.  DVT prophylaxis: Subcutaneous heparin Code Status: DNR, confirmed with patient Family Communication: Discussed with patient  Disposition Plan: From SNF and likely to SNF on discharge pending renal function progression Consults called: None Level of care: Med-Surg Admission status:  Status is: Observation  The patient remains OBS appropriate and will d/c before 2 midnights.  Dispo: The patient is from: Home              Anticipated d/c is  to: Home  Patient currently is not medically stable to d/c.   Difficult to place patient No   Zada Finders MD [facility]  If 7PM-7AM, please contact night-coverage [email or web address]  12/14/2020, 8:23 PM
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
## 2020-12-14 NOTE — ED Triage Notes (Addendum)
Pt comes via EMS from [facility] with c/o weakness for about 2-3 days. Pt had labs checked per EMs and hgb has dropped from 7.5 to 6.5 in few days.  Pt appears pale in color. Pt denies any dark stools or bleeding. Pt denies any pain.  Pt's legal guardian who is husband listed in chart called and informed of pt's status.

## 2020-12-15 ENCOUNTER — Encounter: Payer: Self-pay | Admitting: Internal Medicine

## 2020-12-15 DIAGNOSIS — N179 Acute kidney failure, unspecified: Secondary | ICD-10-CM | POA: Diagnosis not present

## 2020-12-15 DIAGNOSIS — N189 Chronic kidney disease, unspecified: Secondary | ICD-10-CM | POA: Diagnosis not present

## 2020-12-15 LAB — BASIC METABOLIC PANEL
Anion gap: 11 (ref 5–15)
BUN: 51 mg/dL — ABNORMAL HIGH (ref 8–23)
CO2: 18 mmol/L — ABNORMAL LOW (ref 22–32)
Calcium: 8.4 mg/dL — ABNORMAL LOW (ref 8.9–10.3)
Chloride: 109 mmol/L (ref 98–111)
Creatinine, Ser: 3.1 mg/dL — ABNORMAL HIGH (ref 0.44–1.00)
GFR, Estimated: 16 mL/min — ABNORMAL LOW (ref >60)
Glucose, Bld: 80 mg/dL (ref 70–99)
Potassium: 3.8 mmol/L (ref 3.5–5.1)
Sodium: 138 mmol/L (ref 135–145)

## 2020-12-15 LAB — GLUCOSE, CAPILLARY
Glucose-Capillary: 68 mg/dL — ABNORMAL LOW (ref 70–99)
Glucose-Capillary: 104 mg/dL — ABNORMAL HIGH (ref 70–99)
Glucose-Capillary: 166 mg/dL — ABNORMAL HIGH (ref 70–99)
Glucose-Capillary: 232 mg/dL — ABNORMAL HIGH (ref 70–99)
Glucose-Capillary: 136 mg/dL — ABNORMAL HIGH (ref 70–99)

## 2020-12-15 LAB — CBC
HCT: 23.7 % — ABNORMAL LOW (ref 36.0–46.0)
Hemoglobin: 7.7 g/dL — ABNORMAL LOW (ref 12.0–15.0)
MCH: 28.2 pg (ref 26.0–34.0)
MCHC: 32.5 g/dL (ref 30.0–36.0)
MCV: 86.8 fL (ref 80.0–100.0)
Platelets: 228 10*3/uL (ref 150–400)
RBC: 2.73 MIL/uL — ABNORMAL LOW (ref 3.87–5.11)
RDW: 13.7 % (ref 11.5–15.5)
WBC: 6.5 10*3/uL (ref 4.0–10.5)
nRBC: 0 % (ref 0.0–0.2)

## 2020-12-15 LAB — URINALYSIS, COMPLETE (UACMP) WITH MICROSCOPIC
Bilirubin Urine: NEGATIVE
Glucose, UA: 50 mg/dL — AB
Ketones, ur: NEGATIVE mg/dL
Nitrite: POSITIVE — AB
Protein, ur: >=300 mg/dL — AB
Specific Gravity, Urine: 1.009 (ref 1.005–1.030)
Squamous Epithelial / HPF: NONE SEEN (ref 0–5)
WBC, UA: >50 WBC/hpf — ABNORMAL HIGH (ref 0–5)
pH: 5 (ref 5.0–8.0)

## 2020-12-15 LAB — SODIUM, URINE, RANDOM: Sodium, Ur: 76 mmol/L

## 2020-12-15 LAB — CREATININE, URINE, RANDOM: Creatinine, Urine: 38 mg/dL

## 2020-12-15 LAB — SARS CORONAVIRUS 2 (TAT 6-24 HRS): SARS Coronavirus 2: NEGATIVE

## 2020-12-15 LAB — PROCALCITONIN: Procalcitonin: 0.6 ng/mL

## 2020-12-15 MED ORDER — SODIUM CHLORIDE 0.9 % IV SOLN
1.0000 g | INTRAVENOUS | Status: AC
  Administered 2020-12-15 – 2020-12-17 (×3): 1 g via INTRAVENOUS
  Filled 2020-12-15 (×3): qty 1

## 2020-12-15 MED ORDER — LACTATED RINGERS IV SOLN: INTRAVENOUS | Status: DC

## 2020-12-15 MED ORDER — INSULIN GLARGINE 100 UNIT/ML ~~LOC~~ SOLN
10.0000 [IU] | Freq: Every day | SUBCUTANEOUS | Status: DC
  Administered 2020-12-15 – 2021-01-18 (×34): 10 [IU] via SUBCUTANEOUS
  Filled 2020-12-15 (×37): qty 0.1
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
## 2020-12-15 NOTE — Plan of Care (Signed)

## 2020-12-15 NOTE — Progress Notes (Signed)
Inpatient Diabetes Program Recommendations  AACE/ADA: New Consensus Statement on Inpatient Glycemic Control (2015)  Target Ranges:  Prepandial:   less than 140 mg/dL      Peak postprandial:   less than 180 mg/dL (1-2 hours)      Critically ill patients:  140 - 180 mg/dL   Lab Results  Component Value Date   GLUCAP 166 (H) 12/15/2020   HGBA1C 8.6 (H) 11/29/2020    Review of Glycemic Control  Diabetes history: DM2 Outpatient Diabetes medications: Lantus 14 units + Novolog 0-20 units scale tid Current orders for Inpatient glycemic control: Lantus 12 units + Novolog 0-15 units tid  Inpatient Diabetes Program Recommendations:   Fasting CBG 68 Consider: -Decrease Lantus to 10 units qd Secure chat to Dr. Priscella Mann.  Thank you, Nani Gasser. Jeris Easterly, RN, MSN, CDE  Diabetes Coordinator Inpatient Glycemic Control Team Team Pager [phone] (8am-5pm) 12/15/2020 2:02 PM

## 2020-12-15 NOTE — Care Management Obs Status (Signed)
[place] NOTIFICATION   Patient Details  Name: Susan House MRN: [id number] Date of Birth: December 16, 1955   Medicare Observation Status Notification Given:  Yes (Patient sleeping. Discussed with husband at bedside.)    Candie Chroman, LCSW 12/15/2020, 2:56 PM

## 2020-12-15 NOTE — Progress Notes (Signed)
PROGRESS NOTE    Susan House  ALP:[id number] DOB: 1955-08-30 DOA: 12/14/2020 PCP: Jerrol Banana., MD  Brief Narrative:   65 y.o. female with medical history significant for history of CVA with residual left-sided weakness, left frontal encephalomalacia, CKD stage IIIb-IV, insulin-dependent type 2 diabetes, hypertension, hyperlipidemia, seizure disorder, and memory loss who presents to the ED for evaluation of generalized weakness and abnormal labs.  Patient recently admitted 11/28/2020-12/03/2020 for AKI on CKD managed with IV fluid hydration.  Renal ultrasound showed increased echogenicity compatible with medical renal disease and a 2.7 cm right kidney cyst.  She was discharged to SNF.  Patient states that she was sent to the ED due to abnormal labs.  Found to have AKI on CKD and evidence of urinary tract infection   Assessment & Plan:   Principal Problem:   Acute kidney injury superimposed on chronic kidney disease ([place]) Active Problems:   Hypertension associated with diabetes ([place])   Type 2 diabetes mellitus with complications ([place])   Hemiparesis due to old cerebrovascular accident ([place])   Hyperlipidemia associated with type 2 diabetes mellitus ([place])   Hx of completed stroke  AKI on CKD stage IIIb-IV Recently admitted for the same.   Renal function progressively worsening.  Strong improvement with intravenous fluids Plan: Continue IVF Continue holding enalapril Avoid NSAIDs and other nonessential nephrotoxins Daily renal function  Urinary tract infection Patient unclear exhibiting clear symptoms of UTI however leukocyte esterase and nitrite positive UA Endorsement of symptoms could be clouded by underlying vascular dementia Will elect to treat as acute UTI Plan: Rocephin 1 g IV every 24 hours Follow urine culture and sensitivities Minimize course, treat for 3 days   Anemia of chronic kidney disease and B12 deficiency Hemoglobin 7.8.  Patient denies  any obvious bleeding.  Continue B12 supplement and monitor for any signs/symptoms of bleeding.  Insulin-dependent type 2 diabetes A1c 8.6%.  Continue Lantus 12 units nightly and SSI.  History of CVA with residual left-sided weakness/left frontal encephalomalacia Continue aspirin, Plavix, atorvastatin.  Hypertension Continue amlodipine, clonidine, hydralazine, Toprol-XL.   Holding enalapril as above.  Seizure disorder Continue Keppra Seizure precaution  Memory loss Suspected underlying vascular dementia.   She had some reported confusion/delirium prior to arrival.   Baseline level of mentation is unclear CT head significant for chronic volume loss and small vessel ischemic changes consistent with suspected vascular dementia Outpatient follow-up   DVT prophylaxis: SQ heparin Code Status: DNR Family Communication: Spouse Lama Narayanan [phone] on 6/1 Disposition Plan: Status is: Observation  The patient will require care spanning > 2 midnights and should be moved to inpatient because: Inpatient level of care appropriate due to severity of illness  Dispo: The patient is from: ALF              Anticipated d/c is to: ALF              Patient currently is not medically stable to d/c.   Difficult to place patient No  AKI on CKD and urinary tract infection.  Possible disposition in 24 to 48 hours pending clinical response.     Level of care: Med-Surg  Consultants:   None  Procedures: (Don't include imaging studies which can be auto populated. Include things that cannot be auto populated i.e. Echo, Carotid and venous dopplers, Foley, Bipap, HD, tubes/drains, wound vac, central lines etc)  None  Antimicrobials: (specify start and planned stop date. Auto populated tables are space occupying and do not  give end dates)  Ceftriaxone, start 6/1   Subjective: Seen and examined.  No acute events overnight.  Patient endorses fatigue.  Objective: Vitals:   12/15/20  0501 12/15/20 0743 12/15/20 0840 12/15/20 1128  BP: (!) 147/58 (!) 108/45 (!) 135/49 (!) 143/54  Pulse: 70 60  65  Resp: 20 16  18   Temp: 97.6 F (36.4 C) (!) 97.4 F (36.3 C)  97.8 F (36.6 C)  TempSrc: Oral Oral  Oral  SpO2: 100% 99%  99%    Intake/Output Summary (Last 24 hours) at 12/15/2020 1143 Last data filed at 12/15/2020 1006 Gross per 24 hour  Intake 260 ml  Output --  Net 260 ml   There were no vitals filed for this visit.  Examination:  General exam: No acute distress.  Appears frail Respiratory system: Lungs clear.  Normal work of breathing.  Room air Cardiovascular system: S1 & S2 heard, RRR. No JVD, murmurs, rubs, gallops or clicks. No pedal edema. Gastrointestinal system: Abdomen is nondistended, soft and nontender. No organomegaly or masses felt. Normal bowel sounds heard. Central nervous system: Alert, oriented x2, no focal deficits Extremities: Symmetric 5 x 5 power. Skin: No rashes, lesions or ulcers Psychiatry: Judgement and insight appear impaired. Mood & affect flattened.     Data Reviewed: I have personally reviewed following labs and imaging studies  CBC: Recent Labs  Lab 12/14/20 1811 12/15/20 0418  WBC 8.4 6.5  HGB 7.8* 7.7*  HCT 23.6* 23.7*  MCV 86.4 86.8  PLT 226 170   Basic Metabolic Panel: Recent Labs  Lab 12/14/20 1811 12/15/20 0418  NA 137 138  K 4.0 3.8  CL 108 109  CO2 19* 18*  GLUCOSE 189* 80  BUN 50* 51*  CREATININE 3.26* 3.10*  CALCIUM 8.6* 8.4*   GFR: CrCl cannot be calculated (Unknown ideal weight.). Liver Function Tests: No results for input(s): AST, ALT, ALKPHOS, BILITOT, PROT, ALBUMIN in the last 168 hours. No results for input(s): LIPASE, AMYLASE in the last 168 hours. No results for input(s): AMMONIA in the last 168 hours. Coagulation Profile: No results for input(s): INR, PROTIME in the last 168 hours. Cardiac Enzymes: No results for input(s): CKTOTAL, CKMB, CKMBINDEX, TROPONINI in the last 168 hours. BNP  (last 3 results) No results for input(s): PROBNP in the last 8760 hours. HbA1C: No results for input(s): HGBA1C in the last 72 hours. CBG: Recent Labs  Lab 12/14/20 2200 12/15/20 0744 12/15/20 1128  GLUCAP 180* 68* 166*   Lipid Profile: No results for input(s): CHOL, HDL, LDLCALC, TRIG, CHOLHDL, LDLDIRECT in the last 72 hours. Thyroid Function Tests: No results for input(s): TSH, T4TOTAL, FREET4, T3FREE, THYROIDAB in the last 72 hours. Anemia Panel: No results for input(s): VITAMINB12, FOLATE, FERRITIN, TIBC, IRON, RETICCTPCT in the last 72 hours. Sepsis Labs: Recent Labs  Lab 12/15/20 0418  PROCALCITON 0.60    Recent Results (from the past 240 hour(s))  SARS CORONAVIRUS 2 (TAT 6-24 HRS) Nasopharyngeal Nasopharyngeal Swab     Status: None   Collection Time: 12/14/20  8:28 PM   Specimen: Nasopharyngeal Swab  Result Value Ref Range Status   SARS Coronavirus 2 NEGATIVE NEGATIVE Final    Comment: (NOTE) SARS-CoV-2 target nucleic acids are NOT DETECTED.  The SARS-CoV-2 RNA is generally detectable in upper and lower respiratory specimens during the acute phase of infection. Negative results do not preclude SARS-CoV-2 infection, do not rule out co-infections with other pathogens, and should not be used as the sole basis for treatment or  other patient management decisions. Negative results must be combined with clinical observations, patient history, and epidemiological information. The expected result is Negative.  Fact Sheet for Patients: SugarRoll.be  Fact Sheet for Healthcare Providers: https://www.woods-mathews.com/  This test is not yet approved or cleared by the [place] FDA and  has been authorized for detection and/or diagnosis of SARS-CoV-2 by FDA under an Emergency Use Authorization (EUA). This EUA will remain  in effect (meaning this test can be used) for the duration of the COVID-19 declaration under Se ction  564(b)(1) of the Act, 21 U.S.C. section 360bbb-3(b)(1), unless the authorization is terminated or revoked sooner.  Performed at [facility], [place] [address] [place], [place] [address]          Radiology Studies: CT Head Wo Contrast  Result Date: 12/14/2020 CLINICAL DATA:  Weakness low hemoglobin EXAM: CT HEAD WITHOUT CONTRAST TECHNIQUE: Contiguous axial images were obtained from the base of the skull through the vertex without intravenous contrast. COMPARISON:  MRI 11/30/2020, CT brain 11/28/2020 FINDINGS: Brain: No acute territorial infarction, hemorrhage or intracranial mass. Atrophy and chronic small vessel ischemic changes of the white matter. Chronic infarcts involving left thalamus, left basal ganglia and left frontal white matter with ex vacuo dilatation of the ventricle. Chronic lacunar infarct in the right posterior internal capsule. Vascular: No hyperdense vessels. Vertebral and carotid vascular calcification Skull: Normal. Negative for fracture or focal lesion. Sinuses/Orbits: Mild mucosal thickening in the sinuses. Chronic deformity medial wall right orbit. Other: None IMPRESSION: 1. No CT evidence for acute intracranial abnormality. 2. Atrophy and chronic small vessel ischemic changes of the white matter. Multiple chronic infarcts as described above. Electronically Signed   By: Donavan Foil M.D.   On: 12/14/2020 21:20        Scheduled Meds: . amLODipine  10 mg Oral Daily  . aspirin  81 mg Oral Daily  . atorvastatin  20 mg Oral QHS  . cloNIDine  0.3 mg Oral BID  . clopidogrel  75 mg Oral Daily  . gabapentin  200 mg Oral BID  . heparin  5,000 Units Subcutaneous Q8H  . hydrALAZINE  75 mg Oral TID  . insulin aspart  0-15 Units Subcutaneous TID WC  . insulin glargine  12 Units Subcutaneous QHS  . levETIRAcetam  500 mg Oral BID  . metoprolol succinate  100 mg Oral Daily  . pantoprazole  40 mg Oral Daily  . vitamin B-12  250 mcg Oral Daily   Continuous  Infusions: . cefTRIAXone (ROCEPHIN)  IV 1 g (12/15/20 0901)  . lactated ringers 75 mL/hr at 12/15/20 0906     LOS: 0 days    Time spent: 25 minutes    Sidney Ace, MD [facility] Pager [phone]  If 7PM-7AM, please contact night-coverage 12/15/2020, 11:43 AM

## 2020-12-16 ENCOUNTER — Observation Stay: Payer: Medicare Other

## 2020-12-16 DIAGNOSIS — E785 Hyperlipidemia, unspecified: Secondary | ICD-10-CM | POA: Diagnosis not present

## 2020-12-16 DIAGNOSIS — K219 Gastro-esophageal reflux disease without esophagitis: Secondary | ICD-10-CM | POA: Diagnosis not present

## 2020-12-16 DIAGNOSIS — K6389 Other specified diseases of intestine: Secondary | ICD-10-CM | POA: Diagnosis not present

## 2020-12-16 DIAGNOSIS — G459 Transient cerebral ischemic attack, unspecified: Secondary | ICD-10-CM | POA: Diagnosis not present

## 2020-12-16 DIAGNOSIS — Z794 Long term (current) use of insulin: Secondary | ICD-10-CM | POA: Diagnosis not present

## 2020-12-16 DIAGNOSIS — E1165 Type 2 diabetes mellitus with hyperglycemia: Secondary | ICD-10-CM | POA: Diagnosis present

## 2020-12-16 DIAGNOSIS — I69320 Aphasia following cerebral infarction: Secondary | ICD-10-CM | POA: Diagnosis not present

## 2020-12-16 DIAGNOSIS — I10 Essential (primary) hypertension: Secondary | ICD-10-CM | POA: Diagnosis not present

## 2020-12-16 DIAGNOSIS — Z85528 Personal history of other malignant neoplasm of kidney: Secondary | ICD-10-CM | POA: Diagnosis not present

## 2020-12-16 DIAGNOSIS — R1312 Dysphagia, oropharyngeal phase: Secondary | ICD-10-CM | POA: Diagnosis not present

## 2020-12-16 DIAGNOSIS — J9811 Atelectasis: Secondary | ICD-10-CM | POA: Diagnosis not present

## 2020-12-16 DIAGNOSIS — G9340 Encephalopathy, unspecified: Secondary | ICD-10-CM | POA: Diagnosis not present

## 2020-12-16 DIAGNOSIS — R1907 Generalized intra-abdominal and pelvic swelling, mass and lump: Secondary | ICD-10-CM | POA: Diagnosis not present

## 2020-12-16 DIAGNOSIS — N1832 Chronic kidney disease, stage 3b: Secondary | ICD-10-CM | POA: Diagnosis not present

## 2020-12-16 DIAGNOSIS — I69359 Hemiplegia and hemiparesis following cerebral infarction affecting unspecified side: Secondary | ICD-10-CM | POA: Diagnosis not present

## 2020-12-16 DIAGNOSIS — I69998 Other sequelae following unspecified cerebrovascular disease: Secondary | ICD-10-CM | POA: Diagnosis not present

## 2020-12-16 DIAGNOSIS — Z862 Personal history of diseases of the blood and blood-forming organs and certain disorders involving the immune mechanism: Secondary | ICD-10-CM | POA: Diagnosis not present

## 2020-12-16 DIAGNOSIS — D631 Anemia in chronic kidney disease: Secondary | ICD-10-CM | POA: Diagnosis not present

## 2020-12-16 DIAGNOSIS — F32A Depression, unspecified: Secondary | ICD-10-CM | POA: Diagnosis not present

## 2020-12-16 DIAGNOSIS — E876 Hypokalemia: Secondary | ICD-10-CM | POA: Diagnosis not present

## 2020-12-16 DIAGNOSIS — U071 COVID-19: Secondary | ICD-10-CM | POA: Diagnosis not present

## 2020-12-16 DIAGNOSIS — Z743 Need for continuous supervision: Secondary | ICD-10-CM | POA: Diagnosis not present

## 2020-12-16 DIAGNOSIS — I672 Cerebral atherosclerosis: Secondary | ICD-10-CM | POA: Diagnosis not present

## 2020-12-16 DIAGNOSIS — N189 Chronic kidney disease, unspecified: Secondary | ICD-10-CM | POA: Diagnosis not present

## 2020-12-16 DIAGNOSIS — I69393 Ataxia following cerebral infarction: Secondary | ICD-10-CM | POA: Diagnosis not present

## 2020-12-16 DIAGNOSIS — E1142 Type 2 diabetes mellitus with diabetic polyneuropathy: Secondary | ICD-10-CM | POA: Diagnosis present

## 2020-12-16 DIAGNOSIS — G9341 Metabolic encephalopathy: Secondary | ICD-10-CM | POA: Diagnosis present

## 2020-12-16 DIAGNOSIS — K589 Irritable bowel syndrome without diarrhea: Secondary | ICD-10-CM | POA: Diagnosis not present

## 2020-12-16 DIAGNOSIS — Z7189 Other specified counseling: Secondary | ICD-10-CM | POA: Diagnosis not present

## 2020-12-16 DIAGNOSIS — M2550 Pain in unspecified joint: Secondary | ICD-10-CM | POA: Diagnosis not present

## 2020-12-16 DIAGNOSIS — I120 Hypertensive chronic kidney disease with stage 5 chronic kidney disease or end stage renal disease: Secondary | ICD-10-CM | POA: Diagnosis not present

## 2020-12-16 DIAGNOSIS — I129 Hypertensive chronic kidney disease with stage 1 through stage 4 chronic kidney disease, or unspecified chronic kidney disease: Secondary | ICD-10-CM | POA: Diagnosis not present

## 2020-12-16 DIAGNOSIS — I639 Cerebral infarction, unspecified: Secondary | ICD-10-CM | POA: Diagnosis not present

## 2020-12-16 DIAGNOSIS — N186 End stage renal disease: Secondary | ICD-10-CM | POA: Diagnosis not present

## 2020-12-16 DIAGNOSIS — N171 Acute kidney failure with acute cortical necrosis: Secondary | ICD-10-CM | POA: Diagnosis not present

## 2020-12-16 DIAGNOSIS — E1140 Type 2 diabetes mellitus with diabetic neuropathy, unspecified: Secondary | ICD-10-CM | POA: Diagnosis not present

## 2020-12-16 DIAGNOSIS — E1122 Type 2 diabetes mellitus with diabetic chronic kidney disease: Secondary | ICD-10-CM | POA: Diagnosis not present

## 2020-12-16 DIAGNOSIS — N3289 Other specified disorders of bladder: Secondary | ICD-10-CM | POA: Diagnosis not present

## 2020-12-16 DIAGNOSIS — E1151 Type 2 diabetes mellitus with diabetic peripheral angiopathy without gangrene: Secondary | ICD-10-CM | POA: Diagnosis present

## 2020-12-16 DIAGNOSIS — I69354 Hemiplegia and hemiparesis following cerebral infarction affecting left non-dominant side: Secondary | ICD-10-CM | POA: Diagnosis not present

## 2020-12-16 DIAGNOSIS — E1159 Type 2 diabetes mellitus with other circulatory complications: Secondary | ICD-10-CM | POA: Diagnosis not present

## 2020-12-16 DIAGNOSIS — N184 Chronic kidney disease, stage 4 (severe): Secondary | ICD-10-CM | POA: Diagnosis not present

## 2020-12-16 DIAGNOSIS — L03818 Cellulitis of other sites: Secondary | ICD-10-CM | POA: Diagnosis not present

## 2020-12-16 DIAGNOSIS — N281 Cyst of kidney, acquired: Secondary | ICD-10-CM | POA: Diagnosis not present

## 2020-12-16 DIAGNOSIS — I152 Hypertension secondary to endocrine disorders: Secondary | ICD-10-CM | POA: Diagnosis not present

## 2020-12-16 DIAGNOSIS — M1990 Unspecified osteoarthritis, unspecified site: Secondary | ICD-10-CM | POA: Diagnosis not present

## 2020-12-16 DIAGNOSIS — Z8673 Personal history of transient ischemic attack (TIA), and cerebral infarction without residual deficits: Secondary | ICD-10-CM | POA: Diagnosis not present

## 2020-12-16 DIAGNOSIS — R6889 Other general symptoms and signs: Secondary | ICD-10-CM | POA: Diagnosis not present

## 2020-12-16 DIAGNOSIS — J309 Allergic rhinitis, unspecified: Secondary | ICD-10-CM | POA: Diagnosis not present

## 2020-12-16 DIAGNOSIS — Z515 Encounter for palliative care: Secondary | ICD-10-CM | POA: Diagnosis not present

## 2020-12-16 DIAGNOSIS — E43 Unspecified severe protein-calorie malnutrition: Secondary | ICD-10-CM | POA: Diagnosis not present

## 2020-12-16 DIAGNOSIS — Z6841 Body Mass Index (BMI) 40.0 and over, adult: Secondary | ICD-10-CM | POA: Diagnosis not present

## 2020-12-16 DIAGNOSIS — N183 Chronic kidney disease, stage 3 unspecified: Secondary | ICD-10-CM | POA: Diagnosis not present

## 2020-12-16 DIAGNOSIS — Z992 Dependence on renal dialysis: Secondary | ICD-10-CM | POA: Diagnosis not present

## 2020-12-16 DIAGNOSIS — R809 Proteinuria, unspecified: Secondary | ICD-10-CM | POA: Diagnosis not present

## 2020-12-16 DIAGNOSIS — K5730 Diverticulosis of large intestine without perforation or abscess without bleeding: Secondary | ICD-10-CM | POA: Diagnosis not present

## 2020-12-16 DIAGNOSIS — I2510 Atherosclerotic heart disease of native coronary artery without angina pectoris: Secondary | ICD-10-CM | POA: Diagnosis not present

## 2020-12-16 DIAGNOSIS — E11319 Type 2 diabetes mellitus with unspecified diabetic retinopathy without macular edema: Secondary | ICD-10-CM | POA: Diagnosis present

## 2020-12-16 DIAGNOSIS — E039 Hypothyroidism, unspecified: Secondary | ICD-10-CM | POA: Diagnosis present

## 2020-12-16 DIAGNOSIS — Z20822 Contact with and (suspected) exposure to covid-19: Secondary | ICD-10-CM | POA: Diagnosis present

## 2020-12-16 DIAGNOSIS — G2581 Restless legs syndrome: Secondary | ICD-10-CM | POA: Diagnosis present

## 2020-12-16 DIAGNOSIS — Z7401 Bed confinement status: Secondary | ICD-10-CM | POA: Diagnosis not present

## 2020-12-16 DIAGNOSIS — Z66 Do not resuscitate: Secondary | ICD-10-CM | POA: Diagnosis present

## 2020-12-16 DIAGNOSIS — N179 Acute kidney failure, unspecified: Secondary | ICD-10-CM | POA: Diagnosis present

## 2020-12-16 DIAGNOSIS — I69398 Other sequelae of cerebral infarction: Secondary | ICD-10-CM | POA: Diagnosis not present

## 2020-12-16 DIAGNOSIS — E1169 Type 2 diabetes mellitus with other specified complication: Secondary | ICD-10-CM | POA: Diagnosis not present

## 2020-12-16 DIAGNOSIS — R531 Weakness: Secondary | ICD-10-CM | POA: Diagnosis not present

## 2020-12-16 DIAGNOSIS — E7800 Pure hypercholesterolemia, unspecified: Secondary | ICD-10-CM | POA: Diagnosis not present

## 2020-12-16 DIAGNOSIS — M6281 Muscle weakness (generalized): Secondary | ICD-10-CM | POA: Diagnosis not present

## 2020-12-16 DIAGNOSIS — R079 Chest pain, unspecified: Secondary | ICD-10-CM | POA: Diagnosis not present

## 2020-12-16 DIAGNOSIS — G40909 Epilepsy, unspecified, not intractable, without status epilepticus: Secondary | ICD-10-CM | POA: Diagnosis present

## 2020-12-16 DIAGNOSIS — E538 Deficiency of other specified B group vitamins: Secondary | ICD-10-CM | POA: Diagnosis not present

## 2020-12-16 LAB — GLUCOSE, CAPILLARY
Glucose-Capillary: 180 mg/dL — ABNORMAL HIGH (ref 70–99)
Glucose-Capillary: 167 mg/dL — ABNORMAL HIGH (ref 70–99)
Glucose-Capillary: 140 mg/dL — ABNORMAL HIGH (ref 70–99)
Glucose-Capillary: 213 mg/dL — ABNORMAL HIGH (ref 70–99)

## 2020-12-16 LAB — BASIC METABOLIC PANEL
Anion gap: 10 (ref 5–15)
BUN: 52 mg/dL — ABNORMAL HIGH (ref 8–23)
CO2: 18 mmol/L — ABNORMAL LOW (ref 22–32)
Calcium: 8.3 mg/dL — ABNORMAL LOW (ref 8.9–10.3)
Chloride: 111 mmol/L (ref 98–111)
Creatinine, Ser: 3.44 mg/dL — ABNORMAL HIGH (ref 0.44–1.00)
GFR, Estimated: 14 mL/min — ABNORMAL LOW (ref >60)
Glucose, Bld: 184 mg/dL — ABNORMAL HIGH (ref 70–99)
Potassium: 4 mmol/L (ref 3.5–5.1)
Sodium: 139 mmol/L (ref 135–145)

## 2020-12-16 MED ORDER — HYDROCORTISONE 1 % EX OINT
TOPICAL_OINTMENT | Freq: Four times a day (QID) | CUTANEOUS | Status: DC | PRN
  Filled 2020-12-16 (×2): qty 28.35

## 2020-12-16 NOTE — Evaluation (Signed)
Physical Therapy Evaluation Patient Details Name: Susan House MRN: [id number] DOB: 06-14-1956 Today's Date: 12/16/2020   History of Present Illness  Pt is a 65 y.o. female with medical history significant for history of CVA with residual left-sided weakness, left frontal encephalomalacia, CKD stage IIIb-IV, insulin-dependent type 2 diabetes, hypertension, hyperlipidemia, seizure disorder, and memory loss who presents to the ED for evaluation of generalized weakness and abnormal labs, potential UTI. Patient recently admitted 11/28/2020-12/03/2020 for AKI on CKD, discharged to SNF.    Clinical Impression  Patient alert, oriented to name only. PLOF gathered from chart, previously from SNF. Pt with L sided hemiparesis from previous CVA. The patient was able to perform supine to sit with maxA, and able to maintain seated balance without UE for several minutes for pt to take her medication from RN. Sit <> stand performed twice, minAx2 with handheld assist, PT blocking L knee due to buckling. Pt unable to take a step at this time. Returned to supine minAx2.  Overall the patient demonstrated deficits (see "PT Problem List") that impede the patient's functional abilities, safety, and mobility and would benefit from skilled PT intervention. Recommendation is SNF due to current level of assistance needed and to maximize independence, safety, and function.      Follow Up Recommendations SNF    Equipment Recommendations  None recommended by PT (TBD at next venue of care)    Recommendations for Other Services       Precautions / Restrictions Precautions Precautions: Fall Restrictions Weight Bearing Restrictions: No      Mobility  Bed Mobility Overal bed mobility: Needs Assistance Bed Mobility: Supine to Sit;Sit to Supine     Supine to sit: Max assist Sit to supine: Mod assist;+2 for physical assistance        Transfers Overall transfer level: Needs assistance Equipment used: 2  person hand held assist Transfers: Sit to/from Stand Sit to Stand: Mod assist;+2 physical assistance         General transfer comment: L knee blocked by PT throughout  Ambulation/Gait             General Gait Details: Unable due to fatigue, knee buckling  Stairs            Wheelchair Mobility    Modified Rankin (Stroke Patients Only)       Balance Overall balance assessment: Needs assistance Sitting-balance support: No upper extremity supported;Feet supported Sitting balance-Leahy Scale: Good Sitting balance - Comments: able to sit for several minutes with good balance and take medications with RN   Standing balance support: Single extremity supported Standing balance-Leahy Scale: Poor Standing balance comment: reliant on physical assist  for standing balance                             Pertinent Vitals/Pain Pain Assessment: No/denies pain    Home Living Family/patient expects to be discharged to:: Private residence Living Arrangements: Spouse/significant other Available Help at Discharge: Family;Available 24 hours/day Type of Home: House Home Access: Ramped entrance     Home Layout: One level Home Equipment: Bedside commode;Cane - single point;Shower seat - built in;Walker - 4 wheels;Walker - 2 wheels;Wheelchair - power Additional Comments: PLOF gathered from previous admission. pt unreliable historian    Prior Function Level of Independence: Needs assistance   Gait / Transfers Assistance Needed: Min A with transfers and able to take several small steps with assist and with a RW with LUE platform,  no fall history in last 6 months, w/c with ramp for home access  ADL's / Homemaking Assistance Needed: Assist with ADLs from dtr and sister        Hand Dominance   Dominant Hand: Right    Extremity/Trunk Assessment   Upper Extremity Assessment Upper Extremity Assessment: Generalized weakness LUE Deficits / Details: Chronic LUE weakness  from CVA    Lower Extremity Assessment Lower Extremity Assessment: Generalized weakness LLE Deficits / Details: Chronic LLE weakness from CVA    Cervical / Trunk Assessment Cervical / Trunk Assessment: Kyphotic  Communication   Communication: No difficulties  Cognition Arousal/Alertness: Awake/alert Behavior During Therapy: WFL for tasks assessed/performed Overall Cognitive Status: Within Functional Limits for tasks assessed                                        General Comments      Exercises     Assessment/Plan    PT Assessment Patient needs continued PT services  PT Problem List Decreased strength;Decreased activity tolerance;Decreased balance;Decreased mobility;Decreased knowledge of use of DME       PT Treatment Interventions DME instruction;Gait training;Functional mobility training;Therapeutic activities;Therapeutic exercise;Balance training;Patient/family education    PT Goals (Current goals can be found in the Care Plan section)  Acute Rehab PT Goals Patient Stated Goal: To get stronger PT Goal Formulation: With patient Time For Goal Achievement: 12/30/20 Potential to Achieve Goals: Fair    Frequency Min 2X/week   Barriers to discharge Inaccessible home environment      Co-evaluation               AM-PAC PT "6 Clicks" Mobility  Outcome Measure Help needed turning from your back to your side while in a flat bed without using bedrails?: A Lot Help needed moving from lying on your back to sitting on the side of a flat bed without using bedrails?: A Lot Help needed moving to and from a bed to a chair (including a wheelchair)?: Total Help needed standing up from a chair using your arms (e.g., wheelchair or bedside chair)?: Total Help needed to walk in hospital room?: Total Help needed climbing 3-5 steps with a railing? : Total 6 Click Score: 8    End of Session Equipment Utilized During Treatment: Gait belt Activity Tolerance:  Patient tolerated treatment well Patient left: in bed;with call bell/phone within reach;with bed alarm set Nurse Communication: Mobility status PT Visit Diagnosis: Unsteadiness on feet (R26.81);Difficulty in walking, not elsewhere classified (R26.2);Muscle weakness (generalized) (M62.81)    Time: 4128-7867 PT Time Calculation (min) (ACUTE ONLY): 32 min   Charges:   PT Evaluation $PT Eval Moderate Complexity: 1 Mod PT Treatments $Therapeutic Activity: 23-37 mins        Lieutenant Diego PT, DPT 12:40 PM,12/16/20

## 2020-12-16 NOTE — Progress Notes (Signed)
PROGRESS NOTE    Susan House  CHY:[id number] DOB: [birth year] DOA: 12/14/2020 PCP: Jerrol Banana., MD  Brief Narrative:   65 y.o. female with medical history significant for history of CVA with residual left-sided weakness, left frontal encephalomalacia, CKD stage IIIb-IV, insulin-dependent type 2 diabetes, hypertension, hyperlipidemia, seizure disorder, and memory loss who presents to the ED for evaluation of generalized weakness and abnormal labs.  Patient recently admitted 11/28/2020-12/03/2020 for AKI on CKD managed with IV fluid hydration.  Renal ultrasound showed increased echogenicity compatible with medical renal disease and a 2.7 cm right kidney cyst.  She was discharged to SNF.  Patient states that she was sent to the ED due to abnormal labs.  Found to have AKI on CKD and evidence of urinary tract infection.    Assessment & Plan:   Principal Problem:   Acute kidney injury superimposed on chronic kidney disease ([place]) Active Problems:   Hypertension associated with diabetes ([place])   Type 2 diabetes mellitus with complications ([facility])   Hemiparesis due to old cerebrovascular accident [facility])   Hyperlipidemia associated with type 2 diabetes mellitus ([place])   Hx of completed stroke   AKI on CKD stage IIIb-IV Recently admitted for the same.   Renal function progressively worsening.  Similar to previous presentation likely etiology is ATN in the setting of poorly controlled hypertension as well as possible relative dehydration/weight depletion 6/2: Creatinine 3.44 Plan: Lactated Ringer's at 75 cc/h Continue holding enalapril Avoid NSAIDs and other nonessential nephrotoxins Daily renal function Check renal ultrasound Urine electrolytes Will involve nephrology should kidney function continue to worsen   Urinary tract infection Patient unclear exhibiting clear symptoms of UTI however leukocyte esterase and nitrite positive UA Endorsement of symptoms could be  clouded by underlying vascular dementia Will elect to treat as acute UTI Unfortunately it appears urine culture was not sent from initial UA Plan: Rocephin 1 g IV every 24 hours Urine culture added on if possible Minimize course, treat for 3 days   Anemia of chronic kidney disease and B12 deficiency Hemoglobin 7.8 on admission.  Patient denies any obvious bleeding.  Continue B12 supplement and monitor for any signs/symptoms of bleeding.  Insulin-dependent type 2 diabetes A1c 8.6%.  Continue Lantus 12 units nightly and SSI.  History of CVA with residual left-sided weakness/left frontal encephalomalacia Continue aspirin, Plavix, atorvastatin.  Hypertension Continue amlodipine, clonidine, hydralazine, Toprol-XL.   Holding enalapril as above.  Seizure disorder Continue Keppra Seizure precaution  Memory loss Suspected underlying vascular dementia.   She had some reported confusion/delirium prior to arrival.   Baseline level of mentation is unclear CT head significant for chronic volume loss and small vessel ischemic changes consistent with suspected vascular dementia Outpatient follow-up   DVT prophylaxis: SQ heparin Code Status: DNR Family Communication: Spouse Nikia Levels [phone] on 6/1.  Daughter [name] [phone] on 6/2.  Daughter is primary point of contact Disposition Plan: Status is: Observation  The patient will require care spanning > 2 midnights and should be moved to inpatient because: Inpatient level of care appropriate due to severity of illness  Dispo: The patient is from: ALF              Anticipated d/c is to: ALF              Patient currently is not medically stable to d/c.   Difficult to place patient No   Worsening kidney function.  Prerenal azotemia versus acute tubular necrosis.  Further work-up in progress.  Remains on IV antibiotics for presumed UTI.           Level of care: Med-Surg  Consultants:   None  Procedures:    None  Antimicrobials:   Ceftriaxone, start 6/1   Subjective: Patient seen and examined.  No acute status changes overnight.  More awake this morning.  Talking about her mother who has passed.  Objective: Vitals:   12/16/20 0433 12/16/20 0820 12/16/20 0857 12/16/20 1116  BP:  (!) 193/81 (!) 177/60 (!) 132/44  Pulse:  79  64  Resp:  16    Temp:  98.3 F (36.8 C)  98 F (36.7 C)  TempSrc:  Oral  Oral  SpO2:  96%  100%  Weight: 79.4 kg       Intake/Output Summary (Last 24 hours) at 12/16/2020 1133 Last data filed at 12/16/2020 1013 Gross per 24 hour  Intake 943.85 ml  Output 700 ml  Net 243.85 ml   Filed Weights   12/16/20 0433  Weight: 79.4 kg    Examination:  General exam: No acute distress.  Confused Respiratory system: Lungs clear.  Normal work of breathing.  Room air Cardiovascular system: S1 & S2 heard, RRR. No JVD, murmurs, rubs, gallops or clicks. No pedal edema. Gastrointestinal system: Abdomen is nondistended, soft and nontender. No organomegaly or masses felt. Normal bowel sounds heard. Central nervous system: Alert, oriented x2, no focal deficits Extremities: Symmetric 5 x 5 power. Skin: No rashes, lesions or ulcers Psychiatry: Judgement and insight appear impaired. Mood & affect flattened.     Data Reviewed: I have personally reviewed following labs and imaging studies  CBC: Recent Labs  Lab 12/14/20 1811 12/15/20 0418  WBC 8.4 6.5  HGB 7.8* 7.7*  HCT 23.6* 23.7*  MCV 86.4 86.8  PLT 226 017   Basic Metabolic Panel: Recent Labs  Lab 12/14/20 1811 12/15/20 0418 12/16/20 0736  NA 137 138 139  K 4.0 3.8 4.0  CL 108 109 111  CO2 19* 18* 18*  GLUCOSE 189* 80 184*  BUN 50* 51* 52*  CREATININE 3.26* 3.10* 3.44*  CALCIUM 8.6* 8.4* 8.3*   GFR: Estimated Creatinine Clearance: 16.1 mL/min (A) (by C-G formula based on SCr of 3.44 mg/dL (H)). Liver Function Tests: No results for input(s): AST, ALT, ALKPHOS, BILITOT, PROT, ALBUMIN in the last  168 hours. No results for input(s): LIPASE, AMYLASE in the last 168 hours. No results for input(s): AMMONIA in the last 168 hours. Coagulation Profile: No results for input(s): INR, PROTIME in the last 168 hours. Cardiac Enzymes: No results for input(s): CKTOTAL, CKMB, CKMBINDEX, TROPONINI in the last 168 hours. BNP (last 3 results) No results for input(s): PROBNP in the last 8760 hours. HbA1C: No results for input(s): HGBA1C in the last 72 hours. CBG: Recent Labs  Lab 12/15/20 1128 12/15/20 1633 12/15/20 2107 12/16/20 0739 12/16/20 1115  GLUCAP 166* 232* 136* 180* 167*   Lipid Profile: No results for input(s): CHOL, HDL, LDLCALC, TRIG, CHOLHDL, LDLDIRECT in the last 72 hours. Thyroid Function Tests: No results for input(s): TSH, T4TOTAL, FREET4, T3FREE, THYROIDAB in the last 72 hours. Anemia Panel: No results for input(s): VITAMINB12, FOLATE, FERRITIN, TIBC, IRON, RETICCTPCT in the last 72 hours. Sepsis Labs: Recent Labs  Lab 12/15/20 0418  PROCALCITON 0.60    Recent Results (from the past 240 hour(s))  SARS CORONAVIRUS 2 (TAT 6-24 HRS) Nasopharyngeal Nasopharyngeal Swab     Status: None   Collection Time: 12/14/20  8:28 PM   Specimen: Nasopharyngeal  Swab  Result Value Ref Range Status   SARS Coronavirus 2 NEGATIVE NEGATIVE Final    Comment: (NOTE) SARS-CoV-2 target nucleic acids are NOT DETECTED.  The SARS-CoV-2 RNA is generally detectable in upper and lower respiratory specimens during the acute phase of infection. Negative results do not preclude SARS-CoV-2 infection, do not rule out co-infections with other pathogens, and should not be used as the sole basis for treatment or other patient management decisions. Negative results must be combined with clinical observations, patient history, and epidemiological information. The expected result is Negative.  Fact Sheet for Patients: SugarRoll.be  Fact Sheet for Healthcare  Providers: https://www.woods-mathews.com/  This test is not yet approved or cleared by the [place] FDA and  has been authorized for detection and/or diagnosis of SARS-CoV-2 by FDA under an Emergency Use Authorization (EUA). This EUA will remain  in effect (meaning this test can be used) for the duration of the COVID-19 declaration under Se ction 564(b)(1) of the Act, 21 U.S.C. section 360bbb-3(b)(1), unless the authorization is terminated or revoked sooner.  Performed at [facility], [place] [address], [place], [place] [address]          Radiology Studies: CT Head Wo Contrast  Result Date: 12/14/2020 CLINICAL DATA:  Weakness low hemoglobin EXAM: CT HEAD WITHOUT CONTRAST TECHNIQUE: Contiguous axial images were obtained from the base of the skull through the vertex without intravenous contrast. COMPARISON:  MRI 11/30/2020, CT brain 11/28/2020 FINDINGS: Brain: No acute territorial infarction, hemorrhage or intracranial mass. Atrophy and chronic small vessel ischemic changes of the white matter. Chronic infarcts involving left thalamus, left basal ganglia and left frontal white matter with ex vacuo dilatation of the ventricle. Chronic lacunar infarct in the right posterior internal capsule. Vascular: No hyperdense vessels. Vertebral and carotid vascular calcification Skull: Normal. Negative for fracture or focal lesion. Sinuses/Orbits: Mild mucosal thickening in the sinuses. Chronic deformity medial wall right orbit. Other: None IMPRESSION: 1. No CT evidence for acute intracranial abnormality. 2. Atrophy and chronic small vessel ischemic changes of the white matter. Multiple chronic infarcts as described above. Electronically Signed   By: Donavan Foil M.D.   On: 12/14/2020 21:20        Scheduled Meds: . amLODipine  10 mg Oral Daily  . aspirin  81 mg Oral Daily  . atorvastatin  20 mg Oral QHS  . cloNIDine  0.3 mg Oral BID  . clopidogrel  75 mg Oral Daily  .  gabapentin  200 mg Oral BID  . heparin  5,000 Units Subcutaneous Q8H  . hydrALAZINE  75 mg Oral TID  . insulin aspart  0-15 Units Subcutaneous TID WC  . insulin glargine  10 Units Subcutaneous QHS  . levETIRAcetam  500 mg Oral BID  . metoprolol succinate  100 mg Oral Daily  . pantoprazole  40 mg Oral Daily  . vitamin B-12  250 mcg Oral Daily   Continuous Infusions: . cefTRIAXone (ROCEPHIN)  IV 1 g (12/16/20 0907)  . lactated ringers 75 mL/hr at 12/16/20 0922     LOS: 0 days    Time spent: 25 minutes    Sidney Ace, MD [facility] Pager [phone]  If 7PM-7AM, please contact night-coverage 12/16/2020, 11:33 AM

## 2020-12-16 NOTE — Evaluation (Signed)
Occupational Therapy Evaluation Patient Details Name: Susan House MRN: [id number] DOB: 01/13/1956 Today's Date: 12/16/2020    History of Present Illness Pt is a 65 y.o. female with medical history significant for history of CVA with residual left-sided weakness, left frontal encephalomalacia, CKD stage IIIb-IV, insulin-dependent type 2 diabetes, hypertension, hyperlipidemia, seizure disorder, and memory loss who presents to the ED for evaluation of generalized weakness and abnormal labs, potential UTI. Patient recently admitted 11/28/2020-12/03/2020 for AKI on CKD, discharged to SNF.   Clinical Impression   Patient presenting with decreased I in self care, balance, functional mobility/transfers, endurance, and safety awareness. Pt oriented to self and location this session. Pt with very confused tangential speech during the rest of the session. Husband present in the room who reports she has recently been at [facility] for [facility] prior to this admission. She has not ambulated in over a year but was transferring to wheelchair and family assisting with self care. Family performs IADL tasks. Patient required mod A to get to EOB with min guard for sitting balance this session. Pt standing with L knee blocked and mod A. Pt would likely benefit from resting hand splint on L UE as well for night.  Patient will benefit from acute OT to increase overall independence in the areas of ADLs, functional mobility, and safety awareness in order to safely discharge to next venue of care.    Follow Up Recommendations  SNF;Supervision/Assistance - 24 hour    Equipment Recommendations  Other (comment) (defer to next venue of care)       Precautions / Restrictions Precautions Precautions: Fall Restrictions Weight Bearing Restrictions: No      Mobility Bed Mobility Overal bed mobility: Needs Assistance Bed Mobility: Supine to Sit;Sit to Supine     Supine to sit: Mod assist Sit to supine: Mod assist         Transfers Overall transfer level: Needs assistance Equipment used: 1 person hand held assist Transfers: Sit to/from Stand Sit to Stand: Mod assist         General transfer comment: L knee blocked    Balance Overall balance assessment: Needs assistance Sitting-balance support: No upper extremity supported;Feet supported Sitting balance-Leahy Scale: Good Sitting balance - Comments: able to sit for several minutes with good balance and take medications with RN   Standing balance support: Single extremity supported Standing balance-Leahy Scale: Poor Standing balance comment: reliant on physical assist  for standing balance                           ADL either performed or assessed with clinical judgement   ADL Overall ADL's : Needs assistance/impaired     Grooming: Wash/dry hands;Wash/dry face;Minimal assistance;Sitting               Lower Body Dressing: Maximal assistance;Total assistance;Sit to/from stand                       Vision Baseline Vision/History: Wears glasses Wears Glasses: At all times Patient Visual Report: No change from baseline              Pertinent Vitals/Pain Pain Assessment: No/denies pain     Hand Dominance Right   Extremity/Trunk Assessment Upper Extremity Assessment Upper Extremity Assessment: Generalized weakness LUE Deficits / Details: Chronic LUE weakness from CVA   Lower Extremity Assessment Lower Extremity Assessment: Generalized weakness LLE Deficits / Details: Chronic LLE weakness from CVA  Cervical / Trunk Assessment Cervical / Trunk Assessment: Kyphotic   Communication Communication Communication: No difficulties   Cognition Arousal/Alertness: Awake/alert Behavior During Therapy: WFL for tasks assessed/performed Overall Cognitive Status: Within Functional Limits for tasks assessed                                                Home Living Family/patient expects to be  discharged to:: Private residence Living Arrangements: Spouse/significant other Available Help at Discharge: Family;Available 24 hours/day Type of Home: House Home Access: Ramped entrance     Home Layout: One level     Bathroom Shower/Tub: Occupational psychologist: Handicapped height Bathroom Accessibility: Yes How Accessible: Accessible via walker Home Equipment: Bedside commode;Cane - single point;Shower seat - built in;Walker - 4 wheels;Walker - 2 wheels;Wheelchair - power   Additional Comments: PLOF gathered from previous admission. pt unreliable historian      Prior Functioning/Environment Level of Independence: Needs assistance  Gait / Transfers Assistance Needed: Min A with transfers and able to take several small steps with assist and with a RW with LUE platform, no fall history in last 6 months, w/c with ramp for home access ADL's / Homemaking Assistance Needed: Assist with ADLs from dtr and sister and husband   Comments: endorses recent falls        OT Problem List: Decreased strength;Impaired balance (sitting and/or standing);Decreased knowledge of precautions;Decreased safety awareness;Cardiopulmonary status limiting activity;Decreased activity tolerance;Decreased knowledge of use of DME or AE;Decreased range of motion      OT Treatment/Interventions: Self-care/ADL training;Manual therapy;Therapeutic exercise;Patient/family education;Balance training;Energy conservation;Therapeutic activities;Cognitive remediation/compensation;DME and/or AE instruction    OT Goals(Current goals can be found in the care plan section) Acute Rehab OT Goals Patient Stated Goal: To get stronger OT Goal Formulation: With patient Time For Goal Achievement: 12/30/20 Potential to Achieve Goals: Fair ADL Goals Pt Will Perform Grooming: sitting;with set-up;with supervision Pt Will Perform Lower Body Dressing: with mod assist;sit to/from stand Pt Will Transfer to Toilet: with mod  assist Pt Will Perform Toileting - Clothing Manipulation and hygiene: with mod assist  OT Frequency: Min 2X/week   Barriers to D/C:    none known at this time          AM-PAC OT "6 Clicks" Daily Activity     Outcome Measure Help from another person eating meals?: A Little Help from another person taking care of personal grooming?: A Little Help from another person toileting, which includes using toliet, bedpan, or urinal?: Total Help from another person bathing (including washing, rinsing, drying)?: A Lot Help from another person to put on and taking off regular upper body clothing?: A Lot Help from another person to put on and taking off regular lower body clothing?: Total 6 Click Score: 12   End of Session Nurse Communication: Mobility status  Activity Tolerance: Patient tolerated treatment well Patient left: in bed;with call bell/phone within reach;with family/visitor present  OT Visit Diagnosis: Unsteadiness on feet (R26.81);Muscle weakness (generalized) (M62.81)                Time: 1410-1431 OT Time Calculation (min): 21 min Charges:  OT General Charges $OT Visit: 1 Visit OT Evaluation $OT Eval Moderate Complexity: 1 Mod OT Treatments $Self Care/Home Management : 8-22 mins  Darleen Crocker, MS, OTR/L , CBIS ascom [phone]  12/16/20, 3:34 PM
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
## 2020-12-16 NOTE — Plan of Care (Signed)

## 2020-12-16 NOTE — TOC Progression Note (Addendum)
Transition of Care [facility]) - Progression Note    Patient Details  Name: Susan House MRN: [id number] Date of Birth: 04/23/1956  Transition of Care [facility]) CM/SW [place], LCSW Phone Number: 12/16/2020, 2:08 PM  Clinical Narrative: Started insurance authorization for potential discharge back to [facility] if stable. If unable to discharge tomorrow, SNF is unable to accept until Monday due to staffing. MD is aware.   4:18 pm: Auth approved but plan auth ID has not generated on [employer] portal yet. Valid 6/2-6/6.  Expected Discharge Plan: [place] Barriers to Discharge: Continued Medical Work up  Expected Discharge Plan and Services Expected Discharge Plan: [place] Acute Care Choice: Resumption of Svcs/PTA Provider Living arrangements for the past 2 months: Single Family Home                                       Social Determinants of Health (SDOH) Interventions    Readmission Risk Interventions No flowsheet data found.

## 2020-12-17 LAB — CBC WITH DIFFERENTIAL/PLATELET
Abs Immature Granulocytes: 0.06 10*3/uL (ref 0.00–0.07)
Basophils Absolute: 0 10*3/uL (ref 0.0–0.1)
Basophils Relative: 1 %
Eosinophils Absolute: 0.5 10*3/uL (ref 0.0–0.5)
Eosinophils Relative: 8 %
HCT: 22.1 % — ABNORMAL LOW (ref 36.0–46.0)
Hemoglobin: 7.4 g/dL — ABNORMAL LOW (ref 12.0–15.0)
Immature Granulocytes: 1 %
Lymphocytes Relative: 29 %
Lymphs Abs: 1.6 10*3/uL (ref 0.7–4.0)
MCH: 28.6 pg (ref 26.0–34.0)
MCHC: 33.5 g/dL (ref 30.0–36.0)
MCV: 85.3 fL (ref 80.0–100.0)
Monocytes Absolute: 0.6 10*3/uL (ref 0.1–1.0)
Monocytes Relative: 10 %
Neutro Abs: 2.9 10*3/uL (ref 1.7–7.7)
Neutrophils Relative %: 51 %
Platelets: 253 10*3/uL (ref 150–400)
RBC: 2.59 MIL/uL — ABNORMAL LOW (ref 3.87–5.11)
RDW: 13.4 % (ref 11.5–15.5)
WBC: 5.7 10*3/uL (ref 4.0–10.5)
nRBC: 0 % (ref 0.0–0.2)

## 2020-12-17 LAB — BASIC METABOLIC PANEL
Anion gap: 10 (ref 5–15)
BUN: 49 mg/dL — ABNORMAL HIGH (ref 8–23)
CO2: 18 mmol/L — ABNORMAL LOW (ref 22–32)
Calcium: 8.4 mg/dL — ABNORMAL LOW (ref 8.9–10.3)
Chloride: 112 mmol/L — ABNORMAL HIGH (ref 98–111)
Creatinine, Ser: 3.38 mg/dL — ABNORMAL HIGH (ref 0.44–1.00)
GFR, Estimated: 15 mL/min — ABNORMAL LOW (ref >60)
Glucose, Bld: 90 mg/dL (ref 70–99)
Potassium: 4 mmol/L (ref 3.5–5.1)
Sodium: 140 mmol/L (ref 135–145)

## 2020-12-17 LAB — GLUCOSE, CAPILLARY
Glucose-Capillary: 91 mg/dL (ref 70–99)
Glucose-Capillary: 175 mg/dL — ABNORMAL HIGH (ref 70–99)
Glucose-Capillary: 155 mg/dL — ABNORMAL HIGH (ref 70–99)
Glucose-Capillary: 203 mg/dL — ABNORMAL HIGH (ref 70–99)

## 2020-12-17 LAB — LEVETIRACETAM LEVEL: Levetiracetam Lvl: 43.7 ug/mL — ABNORMAL HIGH (ref 10.0–40.0)

## 2020-12-17 MED ORDER — HYDRALAZINE HCL 50 MG PO TABS
100.0000 mg | ORAL_TABLET | Freq: Three times a day (TID) | ORAL | Status: DC
  Administered 2020-12-17 – 2020-12-26 (×26): 100 mg via ORAL
  Filled 2020-12-17 (×27): qty 2

## 2020-12-17 MED ORDER — NYSTATIN 100000 UNIT/GM EX POWD
1.0000 "application " | Freq: Two times a day (BID) | CUTANEOUS | Status: DC
  Administered 2020-12-17 – 2021-01-19 (×59): 1 via TOPICAL
  Filled 2020-12-17: qty 15

## 2020-12-17 NOTE — Progress Notes (Signed)
PROGRESS NOTE    Susan House  MVE:[id number] DOB: 20-Sep-1955 DOA: 12/14/2020 PCP: Jerrol Banana., MD  Brief Narrative:   65 y.o. female with medical history significant for history of CVA with residual left-sided weakness, left frontal encephalomalacia, CKD stage IIIb-IV, insulin-dependent type 2 diabetes, hypertension, hyperlipidemia, seizure disorder, and memory loss who presents to the ED for evaluation of generalized weakness and abnormal labs.  Patient recently admitted 11/28/2020-12/03/2020 for AKI on CKD managed with IV fluid hydration.  Renal ultrasound showed increased echogenicity compatible with medical renal disease and a 2.7 cm right kidney cyst.  She was discharged to SNF.  Patient states that she was sent to the ED due to abnormal labs.  Found to have AKI on CKD and evidence of urinary tract infection.  Kidney function appears to have peaked at 3.44.  Now slightly downtrending.  Mental status also slowly improving.      Assessment & Plan:   Principal Problem:   Acute kidney injury superimposed on chronic kidney disease ([place]) Active Problems:   Hypertension associated with diabetes ([place])   Type 2 diabetes mellitus with complications (HCC)   Hemiparesis due to old cerebrovascular accident ([place])   Hyperlipidemia associated with type 2 diabetes mellitus ([place])   Hx of completed stroke   AKI (acute kidney injury) ([place])   AKI on CKD stage IIIb-IV Recently admitted for the same.   Renal function progressively worsening.  Similar to previous presentation likely etiology is ATN in the setting of poorly controlled hypertension as well as possible relative dehydration/weight depletion 6/2: Creatinine 3.44 6/3: Creatinine 3.38 Renal ultrasound medical renal disease Plan: Continue LR at 75 cc/h Continue holding enalapril Avoid NSAIDs and other nonessential nephrotoxins Will involve nephrology should kidney function continue to worsen urine output  decreased   Urinary tract infection Patient unclear exhibiting clear symptoms of UTI however leukocyte esterase and nitrite positive UA Endorsement of symptoms could be clouded by underlying vascular dementia Will elect to treat as acute UTI Unfortunately it appears urine culture was not sent from initial UA Plan: Rocephin 1 g IV every 24 hours Urine culture added on if possible Minimize course, treat for 3 days   Anemia of chronic kidney disease and B12 deficiency Hemoglobin 7.8 on admission.  Patient denies any obvious bleeding.  Continue B12 supplement and monitor for any signs/symptoms of bleeding.  Insulin-dependent type 2 diabetes A1c 8.6%.  Continue Lantus 12 units nightly and SSI.  History of CVA with residual left-sided weakness/left frontal encephalomalacia Continue aspirin, Plavix, atorvastatin.  Hypertension Continue amlodipine, clonidine, hydralazine, Toprol-XL.   Holding enalapril as above.  Seizure disorder Continue Keppra Seizure precaution  Memory loss Suspected underlying vascular dementia.   She had some reported confusion/delirium prior to arrival.   Baseline level of mentation is unclear CT head significant for chronic volume loss and small vessel ischemic changes consistent with suspected vascular dementia Outpatient follow-up   DVT prophylaxis: SQ heparin Code Status: DNR Family Communication: Spouse Tawney Vanorman [phone] on 6/1.  Daughter [name] [phone] on 6/2.  Daughter is primary point of contact Disposition Plan: Status is: Inpatient  Remains inpatient appropriate because:Inpatient level of care appropriate due to severity of illness   Dispo: The patient is from: SNF              Anticipated d/c is to: SNF              Patient currently is not medically stable to d/c.   Difficult to place  patient No   AKI on CKD and UTI.  Completing UTI treatment today.  Possible discharge in 24 hours if kidney function continues to  improve.                Level of care: Med-Surg  Consultants:   None  Procedures:   None  Antimicrobials:   Ceftriaxone, start 6/1   Subjective: Patient seen and examined.  More alert this morning.  Objective: Vitals:   12/17/20 0500 12/17/20 0509 12/17/20 0834 12/17/20 1109  BP:  (!) 157/66 (!) 157/59 (!) 149/68  Pulse:  71 72 74  Resp:  16 14 18   Temp:  97.7 F (36.5 C) 98.7 F (37.1 C)   TempSrc:   Oral   SpO2:  97% 99% 99%  Weight: 77.3 kg       Intake/Output Summary (Last 24 hours) at 12/17/2020 1138 Last data filed at 12/17/2020 0900 Gross per 24 hour  Intake 840 ml  Output 1100 ml  Net -260 ml   Filed Weights   12/16/20 0433 12/17/20 0500  Weight: 79.4 kg 77.3 kg    Examination:  General exam: No apparent distress Respiratory system: Lungs clear.  Normal work of breathing.  Room air Cardiovascular system: S1 & S2 heard, RRR. No JVD, murmurs, rubs, gallops or clicks. No pedal edema. Gastrointestinal system: Abdomen is nondistended, soft and nontender. No organomegaly or masses felt. Normal bowel sounds heard. Central nervous system: Alert and oriented x2.  No focal deficits Extremities: Symmetric 5 x 5 power. Skin: No rashes, lesions or ulcers Psychiatry: Judgement and insight appear impaired. Mood & affect flattened.     Data Reviewed: I have personally reviewed following labs and imaging studies  CBC: Recent Labs  Lab 12/14/20 1811 12/15/20 0418 12/17/20 0749  WBC 8.4 6.5 5.7  NEUTROABS  --   --  2.9  HGB 7.8* 7.7* 7.4*  HCT 23.6* 23.7* 22.1*  MCV 86.4 86.8 85.3  PLT 226 228 341   Basic Metabolic Panel: Recent Labs  Lab 12/14/20 1811 12/15/20 0418 12/16/20 0736 12/17/20 0749  NA 137 138 139 140  K 4.0 3.8 4.0 4.0  CL 108 109 111 112*  CO2 19* 18* 18* 18*  GLUCOSE 189* 80 184* 90  BUN 50* 51* 52* 49*  CREATININE 3.26* 3.10* 3.44* 3.38*  CALCIUM 8.6* 8.4* 8.3* 8.4*   GFR: Estimated Creatinine Clearance: 16.2  mL/min (A) (by C-G formula based on SCr of 3.38 mg/dL (H)). Liver Function Tests: No results for input(s): AST, ALT, ALKPHOS, BILITOT, PROT, ALBUMIN in the last 168 hours. No results for input(s): LIPASE, AMYLASE in the last 168 hours. No results for input(s): AMMONIA in the last 168 hours. Coagulation Profile: No results for input(s): INR, PROTIME in the last 168 hours. Cardiac Enzymes: No results for input(s): CKTOTAL, CKMB, CKMBINDEX, TROPONINI in the last 168 hours. BNP (last 3 results) No results for input(s): PROBNP in the last 8760 hours. HbA1C: No results for input(s): HGBA1C in the last 72 hours. CBG: Recent Labs  Lab 12/16/20 1115 12/16/20 1613 12/16/20 1957 12/17/20 0710 12/17/20 1109  GLUCAP 167* 140* 213* 91 175*   Lipid Profile: No results for input(s): CHOL, HDL, LDLCALC, TRIG, CHOLHDL, LDLDIRECT in the last 72 hours. Thyroid Function Tests: No results for input(s): TSH, T4TOTAL, FREET4, T3FREE, THYROIDAB in the last 72 hours. Anemia Panel: No results for input(s): VITAMINB12, FOLATE, FERRITIN, TIBC, IRON, RETICCTPCT in the last 72 hours. Sepsis Labs: Recent Labs  Lab 12/15/20 [phone]  PROCALCITON 0.60    Recent Results (from the past 240 hour(s))  SARS CORONAVIRUS 2 (TAT 6-24 HRS) Nasopharyngeal Nasopharyngeal Swab     Status: None   Collection Time: 12/14/20  8:28 PM   Specimen: Nasopharyngeal Swab  Result Value Ref Range Status   SARS Coronavirus 2 NEGATIVE NEGATIVE Final    Comment: (NOTE) SARS-CoV-2 target nucleic acids are NOT DETECTED.  The SARS-CoV-2 RNA is generally detectable in upper and lower respiratory specimens during the acute phase of infection. Negative results do not preclude SARS-CoV-2 infection, do not rule out co-infections with other pathogens, and should not be used as the sole basis for treatment or other patient management decisions. Negative results must be combined with clinical observations, patient history, and  epidemiological information. The expected result is Negative.  Fact Sheet for Patients: SugarRoll.be  Fact Sheet for Healthcare Providers: https://www.woods-mathews.com/  This test is not yet approved or cleared by the [place] FDA and  has been authorized for detection and/or diagnosis of SARS-CoV-2 by FDA under an Emergency Use Authorization (EUA). This EUA will remain  in effect (meaning this test can be used) for the duration of the COVID-19 declaration under Se ction 564(b)(1) of the Act, 21 U.S.C. section 360bbb-3(b)(1), unless the authorization is terminated or revoked sooner.  Performed at [facility], [place] [address], [place], [place] [address]          Radiology Studies: US RENAL  Result Date: 12/16/2020 CLINICAL DATA:  Acute kidney injury. EXAM: RENAL / URINARY TRACT ULTRASOUND COMPLETE COMPARISON:  Dec 02, 2020. FINDINGS: Right Kidney: Renal measurements: 11.0 x 5.0 x 4.5 cm = volume: 129 mL. 2.5 cm simple cyst is seen in midpole cortex. Increased echogenicity of renal parenchyma is noted. No mass or hydronephrosis visualized. Left Kidney: Renal measurements: 10.0 x 6.0 x 4.6 cm = volume: 144 mL. 1.8 cm simple cyst is noted medially. Increased echogenicity of renal parenchyma is noted. No mass or hydronephrosis visualized. Bladder: Appears normal for degree of bladder distention. Bilateral ureteral jets are noted. Other: None. IMPRESSION: Increased echogenicity of renal parenchyma is noted suggesting medical renal disease. Bilateral simple renal cysts are noted. No hydronephrosis or renal obstruction is noted. Electronically Signed   By: Marijo Conception M.D.   On: 12/16/2020 14:36        Scheduled Meds: . amLODipine  10 mg Oral Daily  . aspirin  81 mg Oral Daily  . atorvastatin  20 mg Oral QHS  . cloNIDine  0.3 mg Oral BID  . clopidogrel  75 mg Oral Daily  . gabapentin  200 mg Oral BID  . heparin  5,000 Units  Subcutaneous Q8H  . hydrALAZINE  100 mg Oral TID  . insulin aspart  0-15 Units Subcutaneous TID WC  . insulin glargine  10 Units Subcutaneous QHS  . levETIRAcetam  500 mg Oral BID  . metoprolol succinate  100 mg Oral Daily  . nystatin  1 application Topical BID  . pantoprazole  40 mg Oral Daily  . vitamin B-12  250 mcg Oral Daily   Continuous Infusions: . cefTRIAXone (ROCEPHIN)  IV 1 g (12/17/20 1003)  . lactated ringers 75 mL/hr at 12/16/20 1735     LOS: 1 day    Time spent: 25 minutes    Sidney Ace, MD [facility] Pager [phone]  If 7PM-7AM, please contact night-coverage 12/17/2020, 11:38 AM

## 2020-12-18 LAB — GLUCOSE, CAPILLARY
Glucose-Capillary: 149 mg/dL — ABNORMAL HIGH (ref 70–99)
Glucose-Capillary: 157 mg/dL — ABNORMAL HIGH (ref 70–99)
Glucose-Capillary: 183 mg/dL — ABNORMAL HIGH (ref 70–99)
Glucose-Capillary: 220 mg/dL — ABNORMAL HIGH (ref 70–99)

## 2020-12-18 LAB — CBC WITH DIFFERENTIAL/PLATELET
Abs Immature Granulocytes: 0.17 10*3/uL — ABNORMAL HIGH (ref 0.00–0.07)
Basophils Absolute: 0 10*3/uL (ref 0.0–0.1)
Basophils Relative: 1 %
Eosinophils Absolute: 0.4 10*3/uL (ref 0.0–0.5)
Eosinophils Relative: 6 %
HCT: 22.9 % — ABNORMAL LOW (ref 36.0–46.0)
Hemoglobin: 7.3 g/dL — ABNORMAL LOW (ref 12.0–15.0)
Immature Granulocytes: 3 %
Lymphocytes Relative: 26 %
Lymphs Abs: 1.7 10*3/uL (ref 0.7–4.0)
MCH: 27.9 pg (ref 26.0–34.0)
MCHC: 31.9 g/dL (ref 30.0–36.0)
MCV: 87.4 fL (ref 80.0–100.0)
Monocytes Absolute: 0.7 10*3/uL (ref 0.1–1.0)
Monocytes Relative: 10 %
Neutro Abs: 3.6 10*3/uL (ref 1.7–7.7)
Neutrophils Relative %: 54 %
Platelets: 273 10*3/uL (ref 150–400)
RBC: 2.62 MIL/uL — ABNORMAL LOW (ref 3.87–5.11)
RDW: 13.5 % (ref 11.5–15.5)
WBC: 6.6 10*3/uL (ref 4.0–10.5)
nRBC: 0 % (ref 0.0–0.2)

## 2020-12-18 LAB — BASIC METABOLIC PANEL
Anion gap: 8 (ref 5–15)
BUN: 46 mg/dL — ABNORMAL HIGH (ref 8–23)
CO2: 20 mmol/L — ABNORMAL LOW (ref 22–32)
Calcium: 8.5 mg/dL — ABNORMAL LOW (ref 8.9–10.3)
Chloride: 113 mmol/L — ABNORMAL HIGH (ref 98–111)
Creatinine, Ser: 3.25 mg/dL — ABNORMAL HIGH (ref 0.44–1.00)
GFR, Estimated: 15 mL/min — ABNORMAL LOW (ref >60)
Glucose, Bld: 176 mg/dL — ABNORMAL HIGH (ref 70–99)
Potassium: 3.7 mmol/L (ref 3.5–5.1)
Sodium: 141 mmol/L (ref 135–145)

## 2020-12-18 MED ORDER — METOPROLOL TARTRATE 50 MG PO TABS
100.0000 mg | ORAL_TABLET | Freq: Two times a day (BID) | ORAL | Status: DC
  Administered 2020-12-18 – 2020-12-24 (×13): 100 mg via ORAL
  Filled 2020-12-18 (×13): qty 2

## 2020-12-18 NOTE — Plan of Care (Signed)

## 2020-12-18 NOTE — Progress Notes (Signed)
PROGRESS NOTE    Susan House  RFF:[id number] DOB: Sep 16, 1955 DOA: 12/14/2020 PCP: Jerrol Banana., MD  Brief Narrative:   65 y.o. female with medical history significant for history of CVA with residual left-sided weakness, left frontal encephalomalacia, CKD stage IIIb-IV, insulin-dependent type 2 diabetes, hypertension, hyperlipidemia, seizure disorder, and memory loss who presents to the ED for evaluation of generalized weakness and abnormal labs.  Patient recently admitted 11/28/2020-12/03/2020 for AKI on CKD managed with IV fluid hydration.  Renal ultrasound showed increased echogenicity compatible with medical renal disease and a 2.7 cm right kidney cyst.  She was discharged to SNF.  Patient states that she was sent to the ED due to abnormal labs.  Found to have AKI on CKD and evidence of urinary tract infection.  Kidney function appears to have peaked at 3.44.  Now slightly downtrending.  Mental status also slowly improving.      Assessment & Plan:   Principal Problem:   Acute kidney injury superimposed on chronic kidney disease ([place]) Active Problems:   Hypertension associated with diabetes ([place])   Type 2 diabetes mellitus with complications (HCC)   Hemiparesis due to old cerebrovascular accident ([place])   Hyperlipidemia associated with type 2 diabetes mellitus ([place])   Hx of completed stroke   AKI (acute kidney injury) ([place])   AKI on CKD stage IIIb-IV Recently admitted for the same.   Renal function progressively worsening.  Similar to previous presentation likely etiology is ATN in the setting of poorly controlled hypertension as well as possible relative dehydration/weight depletion Creat: 3.44-->3.38-->3.25 Renal ultrasound medical renal disease Plan: Continue LR at 50 cc/h Continue holding enalapril Avoid NSAIDs and other nonessential nephrotoxins Will involve nephrology should kidney function continue to worsen urine output  decreased  Hypertension Poor control over interval Likely contributing to some of the deterioration in kidney function Plan: Continue amlodipine, clonidine, hydralazine at current doses Hold Toprol-XL 100 mg daily Start metoprolol tartrate 100 mg twice daily Continue holding enalapril Continue amlodipine, clonidine, hydralazine, Toprol-XL.     Urinary tract infection Patient unclear exhibiting clear symptoms of UTI however leukocyte esterase and nitrite positive UA Endorsement of symptoms could be clouded by underlying vascular dementia Will elect to treat as acute UTI Unfortunately it appears urine culture was not sent from initial UA Completed 3 days of Rocephin Plan: No further antibiotics at this point   Anemia of chronic kidney disease and B12 deficiency Hemoglobin 7.8 on admission.  Patient denies any obvious bleeding.  Continue B12 supplement and monitor for any signs/symptoms of bleeding.  Insulin-dependent type 2 diabetes A1c 8.6%.  Continue Lantus 12 units nightly and SSI.  History of CVA with residual left-sided weakness/left frontal encephalomalacia Continue aspirin, Plavix, atorvastatin.  Seizure disorder Continue Keppra Seizure precaution  Memory loss Suspected underlying vascular dementia.   She had some reported confusion/delirium prior to arrival.   Baseline level of mentation is unclear CT head significant for chronic volume loss and small vessel ischemic changes consistent with suspected vascular dementia Outpatient follow-up   DVT prophylaxis: SQ heparin Code Status: DNR Family Communication:  Daughter [name] [phone] on 6/4 Disposition Plan: Status is: Inpatient  Remains inpatient appropriate because:Inpatient level of care appropriate due to severity of illness   Dispo: The patient is from: SNF              Anticipated d/c is to: SNF              Patient currently is not medically  stable to d/c.   Difficult to place patient No    AKI on CKD.  Kidney function improving.  Completed treatment for UTI.  Still has refractory hypertension.  Baseline mental status is unclear.  Possible disposition in 48 hours.   Level of care: Med-Surg  Consultants:   None  Procedures:   None  Antimicrobials:   Ceftriaxone, start 6/1   Subjective: Patient seen and examined.  Sleeping this morning but easily awakened.  No obvious distress  Objective: Vitals:   12/17/20 1926 12/17/20 2345 12/18/20 0418 12/18/20 0827  BP: (!) 167/58 (!) 181/55 (!) 159/75 (!) 174/63  Pulse: 71 88 74 64  Resp: 16 (!) 22 20 18   Temp: 98.5 F (36.9 C) 98.5 F (36.9 C) 99.1 F (37.3 C) 98.8 F (37.1 C)  TempSrc: Oral Oral Oral Oral  SpO2: 99% 97% 98% 100%  Weight:        Intake/Output Summary (Last 24 hours) at 12/18/2020 1028 Last data filed at 12/18/2020 0900 Gross per 24 hour  Intake 1200 ml  Output 1690 ml  Net -490 ml   Filed Weights   12/16/20 0433 12/17/20 0500  Weight: 79.4 kg 77.3 kg    Examination:  General exam: No apparent distress Respiratory system: Lungs clear.  Normal work of breathing.  Room air Cardiovascular system: S1 & S2 heard, RRR. No JVD, murmurs, rubs, gallops or clicks. No pedal edema. Gastrointestinal system: Abdomen is nondistended, soft and nontender. No organomegaly or masses felt. Normal bowel sounds heard. Central nervous system: Alert and oriented x2.  No focal deficits Extremities: Symmetric 5 x 5 power. Skin: No rashes, lesions or ulcers Psychiatry: Judgement and insight appear impaired. Mood & affect flattened.     Data Reviewed: I have personally reviewed following labs and imaging studies  CBC: Recent Labs  Lab 12/14/20 1811 12/15/20 0418 12/17/20 0749 12/18/20 0421  WBC 8.4 6.5 5.7 6.6  NEUTROABS  --   --  2.9 3.6  HGB 7.8* 7.7* 7.4* 7.3*  HCT 23.6* 23.7* 22.1* 22.9*  MCV 86.4 86.8 85.3 87.4  PLT 226 228 253 027   Basic Metabolic Panel: Recent Labs  Lab 12/14/20 1811  12/15/20 0418 12/16/20 0736 12/17/20 0749 12/18/20 0421  NA 137 138 139 140 141  K 4.0 3.8 4.0 4.0 3.7  CL 108 109 111 112* 113*  CO2 19* 18* 18* 18* 20*  GLUCOSE 189* 80 184* 90 176*  BUN 50* 51* 52* 49* 46*  CREATININE 3.26* 3.10* 3.44* 3.38* 3.25*  CALCIUM 8.6* 8.4* 8.3* 8.4* 8.5*   GFR: Estimated Creatinine Clearance: 16.8 mL/min (A) (by C-G formula based on SCr of 3.25 mg/dL (H)). Liver Function Tests: No results for input(s): AST, ALT, ALKPHOS, BILITOT, PROT, ALBUMIN in the last 168 hours. No results for input(s): LIPASE, AMYLASE in the last 168 hours. No results for input(s): AMMONIA in the last 168 hours. Coagulation Profile: No results for input(s): INR, PROTIME in the last 168 hours. Cardiac Enzymes: No results for input(s): CKTOTAL, CKMB, CKMBINDEX, TROPONINI in the last 168 hours. BNP (last 3 results) No results for input(s): PROBNP in the last 8760 hours. HbA1C: No results for input(s): HGBA1C in the last 72 hours. CBG: Recent Labs  Lab 12/17/20 0710 12/17/20 1109 12/17/20 1518 12/17/20 2017 12/18/20 0719  GLUCAP 91 175* 155* 203* 149*   Lipid Profile: No results for input(s): CHOL, HDL, LDLCALC, TRIG, CHOLHDL, LDLDIRECT in the last 72 hours. Thyroid Function Tests: No results for input(s): TSH, T4TOTAL, FREET4,  T3FREE, THYROIDAB in the last 72 hours. Anemia Panel: No results for input(s): VITAMINB12, FOLATE, FERRITIN, TIBC, IRON, RETICCTPCT in the last 72 hours. Sepsis Labs: Recent Labs  Lab 12/15/20 0418  PROCALCITON 0.60    Recent Results (from the past 240 hour(s))  SARS CORONAVIRUS 2 (TAT 6-24 HRS) Nasopharyngeal Nasopharyngeal Swab     Status: None   Collection Time: 12/14/20  8:28 PM   Specimen: Nasopharyngeal Swab  Result Value Ref Range Status   SARS Coronavirus 2 NEGATIVE NEGATIVE Final    Comment: (NOTE) SARS-CoV-2 target nucleic acids are NOT DETECTED.  The SARS-CoV-2 RNA is generally detectable in upper and lower respiratory  specimens during the acute phase of infection. Negative results do not preclude SARS-CoV-2 infection, do not rule out co-infections with other pathogens, and should not be used as the sole basis for treatment or other patient management decisions. Negative results must be combined with clinical observations, patient history, and epidemiological information. The expected result is Negative.  Fact Sheet for Patients: SugarRoll.be  Fact Sheet for Healthcare Providers: https://www.woods-mathews.com/  This test is not yet approved or cleared by the [place] FDA and  has been authorized for detection and/or diagnosis of SARS-CoV-2 by FDA under an Emergency Use Authorization (EUA). This EUA will remain  in effect (meaning this test can be used) for the duration of the COVID-19 declaration under Se ction 564(b)(1) of the Act, 21 U.S.C. section 360bbb-3(b)(1), unless the authorization is terminated or revoked sooner.  Performed at [facility], [place] [address] [place], [place] [address]          Radiology Studies: US RENAL  Result Date: 12/16/2020 CLINICAL DATA:  Acute kidney injury. EXAM: RENAL / URINARY TRACT ULTRASOUND COMPLETE COMPARISON:  Dec 02, 2020. FINDINGS: Right Kidney: Renal measurements: 11.0 x 5.0 x 4.5 cm = volume: 129 mL. 2.5 cm simple cyst is seen in midpole cortex. Increased echogenicity of renal parenchyma is noted. No mass or hydronephrosis visualized. Left Kidney: Renal measurements: 10.0 x 6.0 x 4.6 cm = volume: 144 mL. 1.8 cm simple cyst is noted medially. Increased echogenicity of renal parenchyma is noted. No mass or hydronephrosis visualized. Bladder: Appears normal for degree of bladder distention. Bilateral ureteral jets are noted. Other: None. IMPRESSION: Increased echogenicity of renal parenchyma is noted suggesting medical renal disease. Bilateral simple renal cysts are noted. No hydronephrosis or renal  obstruction is noted. Electronically Signed   By: Marijo Conception M.D.   On: 12/16/2020 14:36        Scheduled Meds: . amLODipine  10 mg Oral Daily  . aspirin  81 mg Oral Daily  . atorvastatin  20 mg Oral QHS  . cloNIDine  0.3 mg Oral BID  . clopidogrel  75 mg Oral Daily  . gabapentin  200 mg Oral BID  . heparin  5,000 Units Subcutaneous Q8H  . hydrALAZINE  100 mg Oral TID  . insulin aspart  0-15 Units Subcutaneous TID WC  . insulin glargine  10 Units Subcutaneous QHS  . levETIRAcetam  500 mg Oral BID  . metoprolol tartrate  100 mg Oral BID  . nystatin  1 application Topical BID  . pantoprazole  40 mg Oral Daily  . vitamin B-12  250 mcg Oral Daily   Continuous Infusions: . lactated ringers 50 mL/hr at 12/18/20 0833     LOS: 2 days    Time spent: 25 minutes    Sidney Ace, MD [facility] Pager [phone]  If 7PM-7AM, please contact night-coverage 12/18/2020, 10:28 AM

## 2020-12-19 LAB — CBC WITH DIFFERENTIAL/PLATELET
Abs Immature Granulocytes: 0.21 10*3/uL — ABNORMAL HIGH (ref 0.00–0.07)
Basophils Absolute: 0 10*3/uL (ref 0.0–0.1)
Basophils Relative: 1 %
Eosinophils Absolute: 0.5 10*3/uL (ref 0.0–0.5)
Eosinophils Relative: 7 %
HCT: 21.5 % — ABNORMAL LOW (ref 36.0–46.0)
Hemoglobin: 7.2 g/dL — ABNORMAL LOW (ref 12.0–15.0)
Immature Granulocytes: 3 %
Lymphocytes Relative: 23 %
Lymphs Abs: 1.5 10*3/uL (ref 0.7–4.0)
MCH: 28.8 pg (ref 26.0–34.0)
MCHC: 33.5 g/dL (ref 30.0–36.0)
MCV: 86 fL (ref 80.0–100.0)
Monocytes Absolute: 0.8 10*3/uL (ref 0.1–1.0)
Monocytes Relative: 12 %
Neutro Abs: 3.7 10*3/uL (ref 1.7–7.7)
Neutrophils Relative %: 54 %
Platelets: 272 10*3/uL (ref 150–400)
RBC: 2.5 MIL/uL — ABNORMAL LOW (ref 3.87–5.11)
RDW: 13.7 % (ref 11.5–15.5)
WBC: 6.8 10*3/uL (ref 4.0–10.5)
nRBC: 0 % (ref 0.0–0.2)

## 2020-12-19 LAB — BASIC METABOLIC PANEL
Anion gap: 8 (ref 5–15)
BUN: 48 mg/dL — ABNORMAL HIGH (ref 8–23)
CO2: 21 mmol/L — ABNORMAL LOW (ref 22–32)
Calcium: 8.3 mg/dL — ABNORMAL LOW (ref 8.9–10.3)
Chloride: 111 mmol/L (ref 98–111)
Creatinine, Ser: 2.92 mg/dL — ABNORMAL HIGH (ref 0.44–1.00)
GFR, Estimated: 17 mL/min — ABNORMAL LOW (ref >60)
Glucose, Bld: 192 mg/dL — ABNORMAL HIGH (ref 70–99)
Potassium: 3.6 mmol/L (ref 3.5–5.1)
Sodium: 140 mmol/L (ref 135–145)

## 2020-12-19 LAB — GLUCOSE, CAPILLARY
Glucose-Capillary: 139 mg/dL — ABNORMAL HIGH (ref 70–99)
Glucose-Capillary: 153 mg/dL — ABNORMAL HIGH (ref 70–99)
Glucose-Capillary: 154 mg/dL — ABNORMAL HIGH (ref 70–99)
Glucose-Capillary: 212 mg/dL — ABNORMAL HIGH (ref 70–99)

## 2020-12-19 MED ORDER — ISOSORBIDE MONONITRATE ER 30 MG PO TB24
30.0000 mg | ORAL_TABLET | Freq: Every day | ORAL | Status: DC
  Administered 2020-12-19 – 2020-12-22 (×4): 30 mg via ORAL
  Filled 2020-12-19 (×4): qty 1

## 2020-12-19 NOTE — Progress Notes (Signed)
MD notified of elevated BP now/throughout night. See flowsheet for previous values    12/19/20 0738  Vitals  Temp 98.1 F (36.7 C)  Temp Source Oral  BP (!) 184/65  MAP (mmHg) 98  BP Location Right Arm  BP Method Automatic  Patient Position (if appropriate) Lying  Pulse Rate 65  Pulse Rate Source Monitor  Resp 18  MEWS COLOR  MEWS Score Color Green  Oxygen Therapy  SpO2 98 %  O2 Device Room Air  Pain Assessment  Pain Scale 0-10  Pain Score 0  MEWS Score  MEWS Temp 0  MEWS Systolic 0  MEWS Pulse 0  MEWS RR 0  MEWS LOC 0  MEWS Score 0

## 2020-12-19 NOTE — Progress Notes (Signed)
PROGRESS NOTE    Susan House  XTK:[id number] DOB: [birth year] DOA: 12/14/2020 PCP: Jerrol Banana., MD  Brief Narrative:   65 y.o. female with medical history significant for history of CVA with residual left-sided weakness, left frontal encephalomalacia, CKD stage IIIb-IV, insulin-dependent type 2 diabetes, hypertension, hyperlipidemia, seizure disorder, and memory loss who presents to the ED for evaluation of generalized weakness and abnormal labs.  Patient recently admitted 11/28/2020-12/03/2020 for AKI on CKD managed with IV fluid hydration.  Renal ultrasound showed increased echogenicity compatible with medical renal disease and a 2.7 cm right kidney cyst.  She was discharged to SNF.  Patient states that she was sent to the ED due to abnormal labs.  Found to have AKI on CKD and evidence of urinary tract infection.  Kidney function appears to have peaked at 3.44.  Now slightly downtrending.  Mental status also slowly improving.  Mental status has been waxing and waning.  Discussed this with the patient's daughter.  I feel this is a progression of underlying vascular dementia and patient if may be established a new psychiatric baseline.      Assessment & Plan:   Principal Problem:   Acute kidney injury superimposed on chronic kidney disease ([place]) Active Problems:   Hypertension associated with diabetes ([place])   Type 2 diabetes mellitus with complications (HCC)   Hemiparesis due to old cerebrovascular accident ([place])   Hyperlipidemia associated with type 2 diabetes mellitus ([place])   Hx of completed stroke   AKI (acute kidney injury) ([place])   AKI on CKD stage IIIb-IV Recently admitted for the same.   Renal function progressively worsening.  Similar to previous presentation likely etiology is ATN in the setting of poorly controlled hypertension as well as possible relative dehydration/weight depletion Creat: 3.44-->3.38-->3.25-->2.92 Renal ultrasound medical renal  disease Plan: DC IV fluids Recheck creatinine in a.m. Continue holding enalapril Avoid NSAIDs and other nonessential nephrotoxins Anticipated date of discharge 12/20/2020  Hypertension Poor control over interval Likely contributing to some of the deterioration in kidney function Plan: Continue amlodipine, clonidine, hydralazine at current doses Continue metoprolol titrate 100 twice daily Add Imdur 30 mg daily Continue holding enalapril     Urinary tract infection Patient unclear exhibiting clear symptoms of UTI however leukocyte esterase and nitrite positive UA Endorsement of symptoms could be clouded by underlying vascular dementia Will elect to treat as acute UTI Unfortunately it appears urine culture was not sent from initial UA Completed 3 days of Rocephin Plan: No further antibiotics at this point   Anemia of chronic kidney disease and B12 deficiency Hemoglobin 7.8 on admission.  Patient denies any obvious bleeding.  Continue B12 supplement and monitor for any signs/symptoms of bleeding.  Insulin-dependent type 2 diabetes A1c 8.6%.  Continue Lantus 12 units nightly and SSI.  History of CVA with residual left-sided weakness/left frontal encephalomalacia Continue aspirin, Plavix, atorvastatin.  Seizure disorder Continue Keppra Seizure precaution  Memory loss Suspected underlying vascular dementia.   She had some reported confusion/delirium prior to arrival.   Baseline level of mentation is unclear CT head significant for chronic volume loss and small vessel ischemic changes consistent with suspected vascular dementia Outpatient follow-up   DVT prophylaxis: SQ heparin Code Status: DNR Family Communication:  Daughter [name] [phone] on 6/4 Disposition Plan: Status is: Inpatient  Remains inpatient appropriate because:Inpatient level of care appropriate due to severity of illness   Dispo: The patient is from: SNF  Anticipated d/c is to:  SNF              Patient currently is not medically stable to d/c.   Difficult to place patient No   AKI on CKD.  Kidney function improving.  Completed treatment for UTI.  Still with refractory hypertension.  Will attempt to optimize blood pressure regimen and plan for discharge 12/20/2020   Level of care: Med-Surg  Consultants:   None  Procedures:   None  Antimicrobials:   Ceftriaxone, start 6/1   Subjective: Patient seen and examined.  Sleeping this morning but easily awakened.  No obvious distress.  Answers all questions appropriately.  Objective: Vitals:   12/18/20 2236 12/19/20 0343 12/19/20 0344 12/19/20 0738  BP: (!) 188/51 (!) 181/68 (!) 168/51 (!) 184/65  Pulse: 80 65 66 65  Resp:  16  18  Temp:  98.3 F (36.8 C)  98.1 F (36.7 C)  TempSrc:  Oral  Oral  SpO2:  97%  98%  Weight:        Intake/Output Summary (Last 24 hours) at 12/19/2020 1006 Last data filed at 12/19/2020 0432 Gross per 24 hour  Intake 2725.75 ml  Output 1700 ml  Net 1025.75 ml   Filed Weights   12/16/20 0433 12/17/20 0500  Weight: 79.4 kg 77.3 kg    Examination:  General exam: No apparent distress Respiratory system: Lungs clear.  Normal work of breathing.  Room air Cardiovascular system: S1 & S2 heard, RRR. No JVD, murmurs, rubs, gallops or clicks. No pedal edema. Gastrointestinal system: Abdomen is nondistended, soft and nontender. No organomegaly or masses felt. Normal bowel sounds heard. Central nervous system: Alert and oriented x2.  No focal deficits Extremities: Symmetric 5 x 5 power. Skin: No rashes, lesions or ulcers Psychiatry: Judgement and insight appear impaired. Mood & affect flattened.     Data Reviewed: I have personally reviewed following labs and imaging studies  CBC: Recent Labs  Lab 12/14/20 1811 12/15/20 0418 12/17/20 0749 12/18/20 0421 12/19/20 0426  WBC 8.4 6.5 5.7 6.6 6.8  NEUTROABS  --   --  2.9 3.6 3.7  HGB 7.8* 7.7* 7.4* 7.3* 7.2*  HCT 23.6*  23.7* 22.1* 22.9* 21.5*  MCV 86.4 86.8 85.3 87.4 86.0  PLT 226 228 253 273 409   Basic Metabolic Panel: Recent Labs  Lab 12/15/20 0418 12/16/20 0736 12/17/20 0749 12/18/20 0421 12/19/20 0426  NA 138 139 140 141 140  K 3.8 4.0 4.0 3.7 3.6  CL 109 111 112* 113* 111  CO2 18* 18* 18* 20* 21*  GLUCOSE 80 184* 90 176* 192*  BUN 51* 52* 49* 46* 48*  CREATININE 3.10* 3.44* 3.38* 3.25* 2.92*  CALCIUM 8.4* 8.3* 8.4* 8.5* 8.3*   GFR: Estimated Creatinine Clearance: 18.7 mL/min (A) (by C-G formula based on SCr of 2.92 mg/dL (H)). Liver Function Tests: No results for input(s): AST, ALT, ALKPHOS, BILITOT, PROT, ALBUMIN in the last 168 hours. No results for input(s): LIPASE, AMYLASE in the last 168 hours. No results for input(s): AMMONIA in the last 168 hours. Coagulation Profile: No results for input(s): INR, PROTIME in the last 168 hours. Cardiac Enzymes: No results for input(s): CKTOTAL, CKMB, CKMBINDEX, TROPONINI in the last 168 hours. BNP (last 3 results) No results for input(s): PROBNP in the last 8760 hours. HbA1C: No results for input(s): HGBA1C in the last 72 hours. CBG: Recent Labs  Lab 12/18/20 0719 12/18/20 1121 12/18/20 1638 12/18/20 2125 12/19/20 0738  GLUCAP 149* 157* 183* 220*  139*   Lipid Profile: No results for input(s): CHOL, HDL, LDLCALC, TRIG, CHOLHDL, LDLDIRECT in the last 72 hours. Thyroid Function Tests: No results for input(s): TSH, T4TOTAL, FREET4, T3FREE, THYROIDAB in the last 72 hours. Anemia Panel: No results for input(s): VITAMINB12, FOLATE, FERRITIN, TIBC, IRON, RETICCTPCT in the last 72 hours. Sepsis Labs: Recent Labs  Lab 12/15/20 0418  PROCALCITON 0.60    Recent Results (from the past 240 hour(s))  SARS CORONAVIRUS 2 (TAT 6-24 HRS) Nasopharyngeal Nasopharyngeal Swab     Status: None   Collection Time: 12/14/20  8:28 PM   Specimen: Nasopharyngeal Swab  Result Value Ref Range Status   SARS Coronavirus 2 NEGATIVE NEGATIVE Final     Comment: (NOTE) SARS-CoV-2 target nucleic acids are NOT DETECTED.  The SARS-CoV-2 RNA is generally detectable in upper and lower respiratory specimens during the acute phase of infection. Negative results do not preclude SARS-CoV-2 infection, do not rule out co-infections with other pathogens, and should not be used as the sole basis for treatment or other patient management decisions. Negative results must be combined with clinical observations, patient history, and epidemiological information. The expected result is Negative.  Fact Sheet for Patients: SugarRoll.be  Fact Sheet for Healthcare Providers: https://www.woods-mathews.com/  This test is not yet approved or cleared by the [place] FDA and  has been authorized for detection and/or diagnosis of SARS-CoV-2 by FDA under an Emergency Use Authorization (EUA). This EUA will remain  in effect (meaning this test can be used) for the duration of the COVID-19 declaration under Se ction 564(b)(1) of the Act, 21 U.S.C. section 360bbb-3(b)(1), unless the authorization is terminated or revoked sooner.  Performed at [facility], [place] [address] [place], [place] [address]          Radiology Studies: No results found.      Scheduled Meds: . amLODipine  10 mg Oral Daily  . aspirin  81 mg Oral Daily  . atorvastatin  20 mg Oral QHS  . cloNIDine  0.3 mg Oral BID  . clopidogrel  75 mg Oral Daily  . gabapentin  200 mg Oral BID  . heparin  5,000 Units Subcutaneous Q8H  . hydrALAZINE  100 mg Oral TID  . insulin aspart  0-15 Units Subcutaneous TID WC  . insulin glargine  10 Units Subcutaneous QHS  . isosorbide mononitrate  30 mg Oral Daily  . levETIRAcetam  500 mg Oral BID  . metoprolol tartrate  100 mg Oral BID  . nystatin  1 application Topical BID  . pantoprazole  40 mg Oral Daily  . vitamin B-12  250 mcg Oral Daily   Continuous Infusions:    LOS: 3 days    Time  spent: 15 minutes    Sidney Ace, MD [facility] Pager [phone]  If 7PM-7AM, please contact night-coverage 12/19/2020, 10:06 AM

## 2020-12-19 NOTE — TOC Progression Note (Addendum)
Transition of Care [facility]) - Progression Note    Patient Details  Name: Sharell Hilmer MRN: [id number] Date of Birth: 01-Aug-1955  Transition of [facility]) CM/SW [place], LCSW Phone Number: 12/19/2020, 9:31 AM  Clinical Narrative:   Plan for SNF tomorrow. Asked for COVID test today in preparation. Checked [employer] portal, Josem Kaufmann is good through tomorrow 6/6.    Expected Discharge Plan: [place] Barriers to Discharge: Continued Medical Work up  Expected Discharge Plan and Services Expected Discharge Plan: [place] Acute Care Choice: Resumption of Svcs/PTA Provider Living arrangements for the past 2 months: Single Family Home                                       Social Determinants of Health (SDOH) Interventions    Readmission Risk Interventions No flowsheet data found.

## 2020-12-20 LAB — BASIC METABOLIC PANEL
Anion gap: 7 (ref 5–15)
BUN: 49 mg/dL — ABNORMAL HIGH (ref 8–23)
CO2: 21 mmol/L — ABNORMAL LOW (ref 22–32)
Calcium: 8.3 mg/dL — ABNORMAL LOW (ref 8.9–10.3)
Chloride: 110 mmol/L (ref 98–111)
Creatinine, Ser: 3.19 mg/dL — ABNORMAL HIGH (ref 0.44–1.00)
GFR, Estimated: 16 mL/min — ABNORMAL LOW (ref >60)
Glucose, Bld: 216 mg/dL — ABNORMAL HIGH (ref 70–99)
Potassium: 3.9 mmol/L (ref 3.5–5.1)
Sodium: 138 mmol/L (ref 135–145)

## 2020-12-20 LAB — CBC WITH DIFFERENTIAL/PLATELET
Abs Immature Granulocytes: 0.15 10*3/uL — ABNORMAL HIGH (ref 0.00–0.07)
Basophils Absolute: 0.1 10*3/uL (ref 0.0–0.1)
Basophils Relative: 1 %
Eosinophils Absolute: 0.6 10*3/uL — ABNORMAL HIGH (ref 0.0–0.5)
Eosinophils Relative: 9 %
HCT: 22.6 % — ABNORMAL LOW (ref 36.0–46.0)
Hemoglobin: 7.5 g/dL — ABNORMAL LOW (ref 12.0–15.0)
Immature Granulocytes: 2 %
Lymphocytes Relative: 20 %
Lymphs Abs: 1.4 10*3/uL (ref 0.7–4.0)
MCH: 28.6 pg (ref 26.0–34.0)
MCHC: 33.2 g/dL (ref 30.0–36.0)
MCV: 86.3 fL (ref 80.0–100.0)
Monocytes Absolute: 0.6 10*3/uL (ref 0.1–1.0)
Monocytes Relative: 9 %
Neutro Abs: 4.1 10*3/uL (ref 1.7–7.7)
Neutrophils Relative %: 59 %
Platelets: 278 10*3/uL (ref 150–400)
RBC: 2.62 MIL/uL — ABNORMAL LOW (ref 3.87–5.11)
RDW: 13.8 % (ref 11.5–15.5)
WBC: 6.9 10*3/uL (ref 4.0–10.5)
nRBC: 0 % (ref 0.0–0.2)

## 2020-12-20 LAB — SARS CORONAVIRUS 2 (TAT 6-24 HRS): SARS Coronavirus 2: NEGATIVE

## 2020-12-20 LAB — GLUCOSE, CAPILLARY
Glucose-Capillary: 201 mg/dL — ABNORMAL HIGH (ref 70–99)
Glucose-Capillary: 195 mg/dL — ABNORMAL HIGH (ref 70–99)
Glucose-Capillary: 153 mg/dL — ABNORMAL HIGH (ref 70–99)
Glucose-Capillary: 211 mg/dL — ABNORMAL HIGH (ref 70–99)

## 2020-12-20 NOTE — Care Management Important Message (Signed)
Important Message  Patient Details  Name: Susan House MRN: [id number] Date of Birth: 19-Oct-1955   Medicare Important Message Given:  Yes     Dannette Barbara 12/20/2020, 11:30 AM

## 2020-12-20 NOTE — Progress Notes (Signed)
   12/20/20 1442  Clinical Encounter Type  Visited With Patient and family together  Visit Type Initial;Spiritual support;Social support  Referral From Chaplain  Consult/Referral To [facility] had brief initial visit with PT, with family member at beside. Chaplain notified PT that Chaplain services was available, and that we were not here only for spiritual care. Chaplain offered emotional support and ministered with intentional presence.

## 2020-12-20 NOTE — Progress Notes (Signed)
Inpatient Diabetes Program Recommendations  AACE/ADA: New Consensus Statement on Inpatient Glycemic Control (2015)  Target Ranges:  Prepandial:   less than 140 mg/dL      Peak postprandial:   less than 180 mg/dL (1-2 hours)      Critically ill patients:  140 - 180 mg/dL   Results for Susan House, Susan House" (MRN [id number]) as of 12/20/2020 12:13  Ref. Range 12/20/2020 07:27 12/20/2020 11:26  Glucose-Capillary Latest Ref Range: 70 - 99 mg/dL 201 (H)  5 units NOVOLOG  195 (H)  3 units NOVOLOG     Home DM Meds: Lantus 14 units QHS       Novolog 0-20 units TID per SSI  Current Orders: Lantus 10 units QHS       Novolog 0-15 units TID    MD- Note AM CBG elevated today.  If this trend continues, please consider increasing the Lantus to 14 units QHS (full home dose)    --Will follow patient during hospitalization--  Wyn Quaker RN, MSN, CDE Diabetes Coordinator Inpatient Glycemic Control Team Team Pager: [phone] (8a-5p)

## 2020-12-20 NOTE — Progress Notes (Signed)
Physical Therapy Treatment Patient Details Name: Susan House MRN: [id number] DOB: Nov 05, 1955 Today's Date: 12/20/2020    History of Present Illness Pt is a 65 y.o. female with medical history significant for history of CVA with residual left-sided weakness, left frontal encephalomalacia, CKD stage IIIb-IV, insulin-dependent type 2 diabetes, hypertension, hyperlipidemia, seizure disorder, and memory loss who presents to the ED for evaluation of generalized weakness and abnormal labs, potential UTI. Patient recently admitted 11/28/2020-12/03/2020 for AKI on CKD, discharged to SNF.    PT Comments    Patient alert, oriented to name and location only. Initially reported some pain when prompted, but did not exhibit pain signs/symptoms during session. The patient was able to perform supine to sit with modA and cues for sequencing. Able to sit EOB for several minutes, and participate in seated exercises including seated reaching, CGA/supervision for safety throughout. One small LOB noted, minA to correct. Sit <> stand with modAx2, pt with increased difficulty coming up into fully standing, appeared more fatigued than last session. Returned to supine with modAx2 for safety, all needs in reach. The patient would benefit from further skilled PT intervention to continue to progress towards goals. Recommendation remains appropriate.     Follow Up Recommendations  SNF     Equipment Recommendations  None recommended by PT (TBD at next venue of care)    Recommendations for Other Services       Precautions / Restrictions Precautions Precautions: Fall Restrictions Weight Bearing Restrictions: No    Mobility  Bed Mobility Overal bed mobility: Needs Assistance Bed Mobility: Supine to Sit;Sit to Supine;Rolling Rolling: Min assist;Mod assist   Supine to sit: Mod assist;HOB elevated Sit to supine: Mod assist;+2 for safety/equipment   General bed mobility comments: modA to roll R, minA to L (LUE  hemiparesis)    Transfers Overall transfer level: Needs assistance Equipment used: 2 person hand held assist Transfers: Sit to/from Stand Sit to Stand: Mod assist;+2 physical assistance         General transfer comment: L knee blocked  Ambulation/Gait             General Gait Details: unable   Stairs             Wheelchair Mobility    Modified Rankin (Stroke Patients Only)       Balance Overall balance assessment: Needs assistance Sitting-balance support: Feet supported;No upper extremity supported Sitting balance-Leahy Scale: Good Sitting balance - Comments: pt able to sit and participate in reaching activities, challenged with reaching far outside BOS     Standing balance-Leahy Scale: Poor Standing balance comment: reliant on physical assist  for standing balance, did not come up into fully standing position                            Cognition Arousal/Alertness: Awake/alert Behavior During Therapy: WFL for tasks assessed/performed Overall Cognitive Status: History of cognitive impairments - at baseline                                 General Comments: Pt oriented to name, hospital, disoriented to birthday, situation, time      Exercises General Exercises - Lower Extremity Long Arc Quad: AROM;Right;10 reps;AAROM;Left;Strengthening Other Exercises Other Exercises: pt able to perform sitting reaching 2x10 with RUE inside and outside of BOS. CGA-supervision for safety. 1 instance of minA to correct for LOB  General Comments        Pertinent Vitals/Pain Pain Assessment: Faces Faces Pain Scale: No hurt Pain Location: Pt did report pain when prompted, but unable to describe/locate, exhibited no signs/symptoms    Home Living                      Prior Function            PT Goals (current goals can now be found in the care plan section) Progress towards PT goals: Progressing toward goals     Frequency    Min 2X/week      PT Plan Current plan remains appropriate    Co-evaluation              AM-PAC PT "6 Clicks" Mobility   Outcome Measure  Help needed turning from your back to your side while in a flat bed without using bedrails?: A Lot Help needed moving from lying on your back to sitting on the side of a flat bed without using bedrails?: A Lot Help needed moving to and from a bed to a chair (including a wheelchair)?: Total Help needed standing up from a chair using your arms (e.g., wheelchair or bedside chair)?: Total Help needed to walk in hospital room?: Total Help needed climbing 3-5 steps with a railing? : Total 6 Click Score: 8    End of Session Equipment Utilized During Treatment: Gait belt Activity Tolerance: Patient tolerated treatment well Patient left: in bed;with call bell/phone within reach;with bed alarm set   PT Visit Diagnosis: Unsteadiness on feet (R26.81);Difficulty in walking, not elsewhere classified (R26.2);Muscle weakness (generalized) (M62.81)     Time: 8502-7741 PT Time Calculation (min) (ACUTE ONLY): 27 min  Charges:  $Therapeutic Exercise: 8-22 mins $Neuromuscular Re-education: 8-22 mins                     Lieutenant Diego PT, DPT 11:12 AM,12/20/20

## 2020-12-20 NOTE — Progress Notes (Signed)
PROGRESS NOTE    Susan House  OBS:[id number] DOB: 12-05-1955 DOA: 12/14/2020 PCP: Jerrol Banana., MD  Brief Narrative:   65 y.o. female with medical history significant for history of CVA with residual left-sided weakness, left frontal encephalomalacia, CKD stage IIIb-IV, insulin-dependent type 2 diabetes, hypertension, hyperlipidemia, seizure disorder, and memory loss who presents to the ED for evaluation of generalized weakness and abnormal labs.  Patient recently admitted 11/28/2020-12/03/2020 for AKI on CKD managed with IV fluid hydration.  Renal ultrasound showed increased echogenicity compatible with medical renal disease and a 2.7 cm right kidney cyst.  She was discharged to SNF.  Patient states that she was sent to the ED due to abnormal labs.  Found to have AKI on CKD and evidence of urinary tract infection.  Kidney function appears to have peaked at 3.44.  Now slightly downtrending.  Mental status also slowly improving.  Mental status has been waxing and waning.  Discussed this with the patient's daughter.  I feel this is a progression of underlying vascular dementia and patient if may be established a new psychiatric baseline.  Unfortunately blood pressure has been very difficult to control.  Intravenous fluids were discontinued on 6/5 and patient had subsequent deterioration kidney function 6/6.  Nephrology consult requested for recommendations.      Assessment & Plan:   Principal Problem:   Acute kidney injury superimposed on chronic kidney disease ([place]) Active Problems:   Hypertension associated with diabetes ([place])   Type 2 diabetes mellitus with complications ([facility])   Hemiparesis due to old cerebrovascular accident ([place])   Hyperlipidemia associated with type 2 diabetes mellitus ([place])   Hx of completed stroke   AKI (acute kidney injury) ([place])   AKI on CKD stage IIIb-IV Recently admitted for the same.   Renal function progressively worsening.  Similar  to previous presentation likely etiology is ATN in the setting of poorly controlled hypertension as well as possible relative dehydration/weight depletion Creat: 3.44-->3.38-->3.25-->2.92-->3.19 Renal ultrasound medical renal disease Plan: Hold IV fluids for now Hold enalapril Avoid NSAIDs and nonessential nephrotoxins Nephrology consult requested for recommendations  Refractory hypertension Poor control over interval Likely contributing to some of the deterioration in kidney function Blood pressure remains elevated despite extensive regimen Plan: Continue amlodipine, clonidine, hydralazine at current doses Continue metoprolol titrate 100 twice daily Continue Imdur 30 mg daily Continue holding enalapril Appreciate nephrology recommendations     Urinary tract infection Patient unclear exhibiting clear symptoms of UTI however leukocyte esterase and nitrite positive UA Endorsement of symptoms could be clouded by underlying vascular dementia Will elect to treat as acute UTI Unfortunately it appears urine culture was not sent from initial UA Completed 3 days of Rocephin Plan: No further antibiotics at this point   Anemia of chronic kidney disease and B12 deficiency Hemoglobin 7.8 on admission.  Patient denies any obvious bleeding.  Continue B12 supplement and monitor for any signs/symptoms of bleeding.  Insulin-dependent type 2 diabetes A1c 8.6%.  Continue Lantus 12 units nightly and SSI.  History of CVA with residual left-sided weakness/left frontal encephalomalacia Continue aspirin, Plavix, atorvastatin.  Seizure disorder Continue Keppra Seizure precaution  Memory loss Suspected underlying vascular dementia.   She had some reported confusion/delirium prior to arrival.   Baseline level of mentation is unclear CT head significant for chronic volume loss and small vessel ischemic changes consistent with suspected vascular dementia Outpatient follow-up   DVT  prophylaxis: SQ heparin Code Status: DNR Family Communication:  Daughter [name] [phone] on  6/5 Disposition Plan: Status is: Inpatient  Remains inpatient appropriate because:Inpatient level of care appropriate due to severity of illness   Dispo: The patient is from: SNF              Anticipated d/c is to: SNF              Patient currently is not medically stable to d/c.   Difficult to place patient No   AKI on CKD.  Kidney function slightly worse over interval.  Discharge plans on hold for now.  Nephrology consult requested.  Patient may have established a new baseline.   Level of care: Med-Surg  Consultants:   None  Procedures:   None  Antimicrobials:   Ceftriaxone, start 6/1   Subjective: Patient seen and examined.  Does not feel well this morning but unable to explain exactly why.  Objective: Vitals:   12/20/20 0020 12/20/20 0424 12/20/20 0500 12/20/20 0800  BP: (!) 117/41 132/65  (!) 180/56  Pulse: 68 69  68  Resp:  16  17  Temp:  98.6 F (37 C)  98.3 F (36.8 C)  TempSrc:  Oral  Oral  SpO2:  97%  98%  Weight:   92.3 kg     Intake/Output Summary (Last 24 hours) at 12/20/2020 1001 Last data filed at 12/20/2020 0514 Gross per 24 hour  Intake 360 ml  Output 1700 ml  Net -1340 ml   Filed Weights   12/16/20 0433 12/17/20 0500 12/20/20 0500  Weight: 79.4 kg 77.3 kg 92.3 kg    Examination:  General exam: Mild distress.  Appears fatigued Respiratory system: Lungs clear.  Normal work of breathing.  Room air Cardiovascular system: S1 & S2 heard, RRR. No JVD, murmurs, rubs, gallops or clicks.  No pitting edema. Gastrointestinal system: Abdomen is nondistended, soft and nontender. No organomegaly or masses felt. Normal bowel sounds heard. Central nervous system: Alert and oriented x2.  No focal deficits Extremities: Symmetric 5 x 5 power. Skin: No rashes, lesions or ulcers Psychiatry: Judgement and insight appear impaired. Mood & affect flattened.      Data Reviewed: I have personally reviewed following labs and imaging studies  CBC: Recent Labs  Lab 12/15/20 0418 12/17/20 0749 12/18/20 0421 12/19/20 0426 12/20/20 0453  WBC 6.5 5.7 6.6 6.8 6.9  NEUTROABS  --  2.9 3.6 3.7 4.1  HGB 7.7* 7.4* 7.3* 7.2* 7.5*  HCT 23.7* 22.1* 22.9* 21.5* 22.6*  MCV 86.8 85.3 87.4 86.0 86.3  PLT 228 253 273 272 009   Basic Metabolic Panel: Recent Labs  Lab 12/16/20 0736 12/17/20 0749 12/18/20 0421 12/19/20 0426 12/20/20 0453  NA 139 140 141 140 138  K 4.0 4.0 3.7 3.6 3.9  CL 111 112* 113* 111 110  CO2 18* 18* 20* 21* 21*  GLUCOSE 184* 90 176* 192* 216*  BUN 52* 49* 46* 48* 49*  CREATININE 3.44* 3.38* 3.25* 2.92* 3.19*  CALCIUM 8.3* 8.4* 8.5* 8.3* 8.3*   GFR: Estimated Creatinine Clearance: 18.8 mL/min (A) (by C-G formula based on SCr of 3.19 mg/dL (H)). Liver Function Tests: No results for input(s): AST, ALT, ALKPHOS, BILITOT, PROT, ALBUMIN in the last 168 hours. No results for input(s): LIPASE, AMYLASE in the last 168 hours. No results for input(s): AMMONIA in the last 168 hours. Coagulation Profile: No results for input(s): INR, PROTIME in the last 168 hours. Cardiac Enzymes: No results for input(s): CKTOTAL, CKMB, CKMBINDEX, TROPONINI in the last 168 hours. BNP (last 3 results) No results  for input(s): PROBNP in the last 8760 hours. HbA1C: No results for input(s): HGBA1C in the last 72 hours. CBG: Recent Labs  Lab 12/19/20 0738 12/19/20 1140 12/19/20 1632 12/19/20 2100 12/20/20 0727  GLUCAP 139* 153* 154* 212* 201*   Lipid Profile: No results for input(s): CHOL, HDL, LDLCALC, TRIG, CHOLHDL, LDLDIRECT in the last 72 hours. Thyroid Function Tests: No results for input(s): TSH, T4TOTAL, FREET4, T3FREE, THYROIDAB in the last 72 hours. Anemia Panel: No results for input(s): VITAMINB12, FOLATE, FERRITIN, TIBC, IRON, RETICCTPCT in the last 72 hours. Sepsis Labs: Recent Labs  Lab 12/15/20 0418  PROCALCITON 0.60     Recent Results (from the past 240 hour(s))  SARS CORONAVIRUS 2 (TAT 6-24 HRS) Nasopharyngeal Nasopharyngeal Swab     Status: None   Collection Time: 12/14/20  8:28 PM   Specimen: Nasopharyngeal Swab  Result Value Ref Range Status   SARS Coronavirus 2 NEGATIVE NEGATIVE Final    Comment: (NOTE) SARS-CoV-2 target nucleic acids are NOT DETECTED.  The SARS-CoV-2 RNA is generally detectable in upper and lower respiratory specimens during the acute phase of infection. Negative results do not preclude SARS-CoV-2 infection, do not rule out co-infections with other pathogens, and should not be used as the sole basis for treatment or other patient management decisions. Negative results must be combined with clinical observations, patient history, and epidemiological information. The expected result is Negative.  Fact Sheet for Patients: SugarRoll.be  Fact Sheet for Healthcare Providers: https://www.woods-mathews.com/  This test is not yet approved or cleared by the [place] FDA and  has been authorized for detection and/or diagnosis of SARS-CoV-2 by FDA under an Emergency Use Authorization (EUA). This EUA will remain  in effect (meaning this test can be used) for the duration of the COVID-19 declaration under Se ction 564(b)(1) of the Act, 21 U.S.C. section 360bbb-3(b)(1), unless the authorization is terminated or revoked sooner.  Performed at [facility], [place] [address] [place], [place] [address]          Radiology Studies: No results found.      Scheduled Meds: . amLODipine  10 mg Oral Daily  . aspirin  81 mg Oral Daily  . atorvastatin  20 mg Oral QHS  . cloNIDine  0.3 mg Oral BID  . clopidogrel  75 mg Oral Daily  . gabapentin  200 mg Oral BID  . heparin  5,000 Units Subcutaneous Q8H  . hydrALAZINE  100 mg Oral TID  . insulin aspart  0-15 Units Subcutaneous TID WC  . insulin glargine  10 Units Subcutaneous QHS   . isosorbide mononitrate  30 mg Oral Daily  . levETIRAcetam  500 mg Oral BID  . metoprolol tartrate  100 mg Oral BID  . nystatin  1 application Topical BID  . pantoprazole  40 mg Oral Daily  . vitamin B-12  250 mcg Oral Daily   Continuous Infusions:    LOS: 4 days    Time spent: 25 minutes    Sidney Ace, MD [facility] Pager [phone]  If 7PM-7AM, please contact night-coverage 12/20/2020, 10:01 AM

## 2020-12-20 NOTE — Progress Notes (Signed)
[facility]  ROUNDING NOTE   Subjective:   Susan House is a 65 y.o. female with medical conditions including CVA with left sided weakness, Type 2 diabetes, hyperlipidemia, seizures, hypertension and CKD 3b. Patient presents ED with weakness and abnormal labs.   We have been consulted to assist in managing AKI. She was recently admitted for this same concern last month. She was discharged to a rehab. Husband is currently at bedside with patient. He states she has remained at rehab since discharge. Patient is currently resting in bed, drowsy. She is arousable, but quickly returns to sleep. Husband states she has not been eating well. This was a concern during the previous admission. Interview is limited to husband's response. Patient unable to remain awake to reliable answer questions.  Currently on room air  Purwick in place  Objective:  Vital signs in last 24 hours:  Temp:  [97.9 F (36.6 C)-98.6 F (37 C)] 97.9 F (36.6 C) (06/06 1114) Pulse Rate:  [62-72] 63 (06/06 1114) Resp:  [16-17] 16 (06/06 1114) BP: (116-183)/(41-65) 158/54 (06/06 1114) SpO2:  [96 %-99 %] 99 % (06/06 1114) Weight:  [92.3 kg] 92.3 kg (06/06 0500)  Weight change:  Filed Weights   12/16/20 0433 12/17/20 0500 12/20/20 0500  Weight: 79.4 kg 77.3 kg 92.3 kg    Intake/Output: I/O last 3 completed shifts: In: 1138.8 [P.O.:600; I.V.:538.8] Out: 2600 [Urine:2600]   Intake/Output this shift:  Total I/O In: 240 [P.O.:240] Out: -   Physical Exam: General: NAD, laying in bed  Head: Normocephalic, atraumatic. Moist oral mucosal membranes  Eyes: Anicteric  Lungs:  Clear ,normal breathing effort  Heart: Regular rate and rhythm  Abdomen:  Soft, nontender   Extremities:  no peripheral edema.  Neurologic: Drowsy, arousable, moving all four extremities  Skin: No lesions       Basic Metabolic Panel: Recent Labs  Lab 12/16/20 0736 12/17/20 0749 12/18/20 0421 12/19/20 0426  12/20/20 0453  NA 139 140 141 140 138  K 4.0 4.0 3.7 3.6 3.9  CL 111 112* 113* 111 110  CO2 18* 18* 20* 21* 21*  GLUCOSE 184* 90 176* 192* 216*  BUN 52* 49* 46* 48* 49*  CREATININE 3.44* 3.38* 3.25* 2.92* 3.19*  CALCIUM 8.3* 8.4* 8.5* 8.3* 8.3*    Liver Function Tests: No results for input(s): AST, ALT, ALKPHOS, BILITOT, PROT, ALBUMIN in the last 168 hours. No results for input(s): LIPASE, AMYLASE in the last 168 hours. No results for input(s): AMMONIA in the last 168 hours.  CBC: Recent Labs  Lab 12/15/20 0418 12/17/20 0749 12/18/20 0421 12/19/20 0426 12/20/20 0453  WBC 6.5 5.7 6.6 6.8 6.9  NEUTROABS  --  2.9 3.6 3.7 4.1  HGB 7.7* 7.4* 7.3* 7.2* 7.5*  HCT 23.7* 22.1* 22.9* 21.5* 22.6*  MCV 86.8 85.3 87.4 86.0 86.3  PLT 228 253 273 272 278    Cardiac Enzymes: No results for input(s): CKTOTAL, CKMB, CKMBINDEX, TROPONINI in the last 168 hours.  BNP: Invalid input(s): POCBNP  CBG: Recent Labs  Lab 12/19/20 1140 12/19/20 1632 12/19/20 2100 12/20/20 0727 12/20/20 1126  GLUCAP 153* 154* 212* 201* 195*    Microbiology: Results for orders placed or performed during the hospital encounter of 12/14/20  SARS CORONAVIRUS 2 (TAT 6-24 HRS) Nasopharyngeal Nasopharyngeal Swab     Status: None   Collection Time: 12/14/20  8:28 PM   Specimen: Nasopharyngeal Swab  Result Value Ref Range Status   SARS Coronavirus 2 NEGATIVE NEGATIVE Final  Comment: (NOTE) SARS-CoV-2 target nucleic acids are NOT DETECTED.  The SARS-CoV-2 RNA is generally detectable in upper and lower respiratory specimens during the acute phase of infection. Negative results do not preclude SARS-CoV-2 infection, do not rule out co-infections with other pathogens, and should not be used as the sole basis for treatment or other patient management decisions. Negative results must be combined with clinical observations, patient history, and epidemiological information. The expected result is  Negative.  Fact Sheet for Patients: SugarRoll.be  Fact Sheet for Healthcare Providers: https://www.woods-mathews.com/  This test is not yet approved or cleared by the [place] FDA and  has been authorized for detection and/or diagnosis of SARS-CoV-2 by FDA under an Emergency Use Authorization (EUA). This EUA will remain  in effect (meaning this test can be used) for the duration of the COVID-19 declaration under Se ction 564(b)(1) of the Act, 21 U.S.C. section 360bbb-3(b)(1), unless the authorization is terminated or revoked sooner.  Performed at [facility], [place] [address], [place], [place] [address]   SARS CORONAVIRUS 2 (TAT 6-24 HRS) Nasopharyngeal Nasopharyngeal Swab     Status: None   Collection Time: 12/19/20  4:40 PM   Specimen: Nasopharyngeal Swab  Result Value Ref Range Status   SARS Coronavirus 2 NEGATIVE NEGATIVE Final    Comment: (NOTE) SARS-CoV-2 target nucleic acids are NOT DETECTED.  The SARS-CoV-2 RNA is generally detectable in upper and lower respiratory specimens during the acute phase of infection. Negative results do not preclude SARS-CoV-2 infection, do not rule out co-infections with other pathogens, and should not be used as the sole basis for treatment or other patient management decisions. Negative results must be combined with clinical observations, patient history, and epidemiological information. The expected result is Negative.  Fact Sheet for Patients: SugarRoll.be  Fact Sheet for Healthcare Providers: https://www.woods-mathews.com/  This test is not yet approved or cleared by the [place] FDA and  has been authorized for detection and/or diagnosis of SARS-CoV-2 by FDA under an Emergency Use Authorization (EUA). This EUA will remain  in effect (meaning this test can be used) for the duration of the COVID-19 declaration under Se ction 564(b)(1) of  the Act, 21 U.S.C. section 360bbb-3(b)(1), unless the authorization is terminated or revoked sooner.  Performed at [facility], [place] [address], [place],  [address]     Coagulation Studies: No results for input(s): LABPROT, INR in the last 72 hours.  Urinalysis: No results for input(s): COLORURINE, LABSPEC, PHURINE, GLUCOSEU, HGBUR, BILIRUBINUR, KETONESUR, PROTEINUR, UROBILINOGEN, NITRITE, LEUKOCYTESUR in the last 72 hours.  Invalid input(s): APPERANCEUR    Imaging: No results found.   Medications:    . amLODipine  10 mg Oral Daily  . aspirin  81 mg Oral Daily  . atorvastatin  20 mg Oral QHS  . cloNIDine  0.3 mg Oral BID  . clopidogrel  75 mg Oral Daily  . gabapentin  200 mg Oral BID  . heparin  5,000 Units Subcutaneous Q8H  . hydrALAZINE  100 mg Oral TID  . insulin aspart  0-15 Units Subcutaneous TID WC  . insulin glargine  10 Units Subcutaneous QHS  . isosorbide mononitrate  30 mg Oral Daily  . levETIRAcetam  500 mg Oral BID  . metoprolol tartrate  100 mg Oral BID  . nystatin  1 application Topical BID  . pantoprazole  40 mg Oral Daily  . vitamin B-12  250 mcg Oral Daily   acetaminophen **OR** acetaminophen, hydrocortisone, ondansetron **OR** ondansetron (ZOFRAN)  IV, senna-docusate  Assessment/ Plan:  [name] is a 65 y.o.  female who was admitted to [facility] on 11/28/2020 for AKI on CKD. Patient has previous medical conditions including diabetes, hypertension, CVA with left hemiplegia residual, morbid obesity and chronic kidney disease stage 3B. She presents to the ED with weakness and abnormal labs.    1. Acute Kidney Injury on chronic kidney disease stage 3B with baseline creatinine 2.32 and GFR of 23 on 12/03/20.  Acute kidney injury secondary to ATN due to poor intake vs uncontrolled hypertension Chronic kidney disease is secondary to uncontrolled diabetes No acute need for dialysis No IV contrast exposure Renal US shows simple renal  cyst, but no obstruction Renal function has fluctuated during thi admission  Currently 3.19 with UOP 1.7L May consider gentle hydration, will discuss with supervising physician Encourage oral nutrition Will monitor   Lab Results  Component Value Date   CREATININE 3.19 (H) 12/20/2020   CREATININE 2.92 (H) 12/19/2020   CREATININE 3.25 (H) 12/18/2020    Intake/Output Summary (Last 24 hours) at 12/20/2020 1337 Last data filed at 12/20/2020 1016 Gross per 24 hour  Intake 360 ml  Output 1700 ml  Net -1340 ml    2. Hypertension:  Home regimen includes Amlodipine, Clonidine, Enalapril, Hydralazine, and Metoprolol.  Enalapril held Remains elevated Primary team planing to increase dosing  3. Diabetes mellitus type II with chronic kidney disease Insulin dependent with Metformin and Lantus prescribed for home Hgb A1c 8.6 on 11/29/20 Elevated during this admission. SSi ordered for management     LOS: 4 Pierre Part 6/6/20221:37 PM

## 2020-12-20 NOTE — TOC Progression Note (Addendum)
Transition of Care [facility]) - Progression Note    Patient Details  Name: Susan House MRN: [id number] Date of Birth: 1955/09/26  Transition of Care [facility]) CM/SW [place], LCSW Phone Number: 12/20/2020, 9:10 AM  Clinical Narrative: COVID results still pending.    9:32 am: Per MD, not stable for discharge today due to worsening kidney functioning and inability to control BP. Left message for [facility] admissions coordinator to notify. Insurance authorization expires after today. Will restart authorization once stable.  Expected Discharge Plan: [place] Barriers to Discharge: Continued Medical Work up  Expected Discharge Plan and Services Expected Discharge Plan: [place] Acute Care Choice: Resumption of Svcs/PTA Provider Living arrangements for the past 2 months: Single Family Home                                       Social Determinants of Health (SDOH) Interventions    Readmission Risk Interventions No flowsheet data found.

## 2020-12-21 LAB — CBC WITH DIFFERENTIAL/PLATELET
Abs Immature Granulocytes: 0.15 10*3/uL — ABNORMAL HIGH (ref 0.00–0.07)
Basophils Absolute: 0.1 10*3/uL (ref 0.0–0.1)
Basophils Relative: 1 %
Eosinophils Absolute: 0.6 10*3/uL — ABNORMAL HIGH (ref 0.0–0.5)
Eosinophils Relative: 9 %
HCT: 23.5 % — ABNORMAL LOW (ref 36.0–46.0)
Hemoglobin: 7.6 g/dL — ABNORMAL LOW (ref 12.0–15.0)
Immature Granulocytes: 2 %
Lymphocytes Relative: 25 %
Lymphs Abs: 1.7 10*3/uL (ref 0.7–4.0)
MCH: 28.4 pg (ref 26.0–34.0)
MCHC: 32.3 g/dL (ref 30.0–36.0)
MCV: 87.7 fL (ref 80.0–100.0)
Monocytes Absolute: 0.9 10*3/uL (ref 0.1–1.0)
Monocytes Relative: 13 %
Neutro Abs: 3.4 10*3/uL (ref 1.7–7.7)
Neutrophils Relative %: 50 %
Platelets: 277 10*3/uL (ref 150–400)
RBC: 2.68 MIL/uL — ABNORMAL LOW (ref 3.87–5.11)
RDW: 13.8 % (ref 11.5–15.5)
WBC: 6.8 10*3/uL (ref 4.0–10.5)
nRBC: 0 % (ref 0.0–0.2)

## 2020-12-21 LAB — BASIC METABOLIC PANEL
Anion gap: 7 (ref 5–15)
BUN: 48 mg/dL — ABNORMAL HIGH (ref 8–23)
CO2: 21 mmol/L — ABNORMAL LOW (ref 22–32)
Calcium: 8.5 mg/dL — ABNORMAL LOW (ref 8.9–10.3)
Chloride: 108 mmol/L (ref 98–111)
Creatinine, Ser: 3.06 mg/dL — ABNORMAL HIGH (ref 0.44–1.00)
GFR, Estimated: 16 mL/min — ABNORMAL LOW (ref >60)
Glucose, Bld: 112 mg/dL — ABNORMAL HIGH (ref 70–99)
Potassium: 3.7 mmol/L (ref 3.5–5.1)
Sodium: 136 mmol/L (ref 135–145)

## 2020-12-21 LAB — GLUCOSE, CAPILLARY
Glucose-Capillary: 110 mg/dL — ABNORMAL HIGH (ref 70–99)
Glucose-Capillary: 255 mg/dL — ABNORMAL HIGH (ref 70–99)
Glucose-Capillary: 172 mg/dL — ABNORMAL HIGH (ref 70–99)
Glucose-Capillary: 169 mg/dL — ABNORMAL HIGH (ref 70–99)

## 2020-12-21 MED ORDER — ENALAPRIL MALEATE 5 MG PO TABS
5.0000 mg | ORAL_TABLET | Freq: Every day | ORAL | Status: DC
  Administered 2020-12-21 – 2020-12-23 (×3): 5 mg via ORAL
  Filled 2020-12-21 (×3): qty 1

## 2020-12-21 NOTE — Progress Notes (Signed)
Occupational Therapy Treatment Patient Details Name: Susan House MRN: [id number] DOB: 05/28/1956 Today's Date: 12/21/2020    History of Present Illness Pt is a 65 y.o. female with medical history significant for history of CVA with residual left-sided weakness, left frontal encephalomalacia, CKD stage IIIb-IV, insulin-dependent type 2 diabetes, hypertension, hyperlipidemia, seizure disorder, and memory loss who presents to the ED for evaluation of generalized weakness and abnormal labs, potential UTI. Patient recently admitted 11/28/2020-12/03/2020 for AKI on CKD, discharged to SNF.   Clinical Impression   Upon entering the room, pt supine in bed and husband in recliner chair. Pt sleeping soundly and agreeable to OT intervention. Pt requires mod A for supine >sit to EOB. OT opening up L UE for weight bearing. Focus on balance and midline orientation with anterior and lateral weight shifts and then returning to midline with min guard - min A for balance. Pt making 2 attempts to stand with max A and L knee blocked but unable to come to upright position. She did clear buttocks each time. Pt fatigued at this time and returns to bed with max A for trunk and B LEs. Pt repositioned in bed for comfort. She was able to correctly verbalize what she had for lunch and husband reports she fed herself. All needs within reach and bed alarm activated.    Follow Up Recommendations  SNF;Supervision/Assistance - 24 hour    Equipment Recommendations  Other (comment) (defer to next venue of care)       Precautions / Restrictions Precautions Precautions: Fall      Mobility Bed Mobility Overal bed mobility: Needs Assistance Bed Mobility: Supine to Sit;Sit to Supine;Rolling     Supine to sit: Mod assist;HOB elevated     General bed mobility comments: assist for trunk support and L LE    Transfers Overall transfer level: Needs assistance Equipment used: 1 person hand held assist Transfers: Sit  to/from Stand Sit to Stand: Max assist         General transfer comment: L knee blocked. 2 attempts with pt able to clear buttocks but coming to full stand    Balance Overall balance assessment: Needs assistance Sitting-balance support: Feet supported;No upper extremity supported Sitting balance-Leahy Scale: Good     Standing balance support: Single extremity supported Standing balance-Leahy Scale: Poor                             ADL either performed or assessed with clinical judgement         Vision Baseline Vision/History: Wears glasses Wears Glasses: At all times Patient Visual Report: No change from baseline              Pertinent Vitals/Pain Pain Assessment: Faces Faces Pain Scale: No hurt              Cognition Arousal/Alertness: Awake/alert Behavior During Therapy: WFL for tasks assessed/performed Overall Cognitive Status: History of cognitive impairments - at baseline                                 General Comments: Pt oriented to name, hospital, disoriented to birthday, situation, time           OT Goals(Current goals can be found in the care plan section) Acute Rehab OT Goals Patient Stated Goal: To get stronger OT Goal Formulation: With patient Time For Goal Achievement: 12/30/20 Potential  to Achieve Goals: Fair  OT Frequency: Min 2X/week    AM-PAC OT "6 Clicks" Daily Activity     Outcome Measure Help from another person eating meals?: A Little Help from another person taking care of personal grooming?: A Little Help from another person toileting, which includes using toliet, bedpan, or urinal?: Total Help from another person bathing (including washing, rinsing, drying)?: A Lot Help from another person to put on and taking off regular upper body clothing?: A Lot Help from another person to put on and taking off regular lower body clothing?: Total 6 Click Score: 12   End of Session Nurse Communication: Mobility  status  Activity Tolerance: Patient tolerated treatment well Patient left: in bed;with call bell/phone within reach;with family/visitor present  OT Visit Diagnosis: Unsteadiness on feet (R26.81);Muscle weakness (generalized) (M62.81)                Time: 0352-4818 OT Time Calculation (min): 26 min Charges:  OT General Charges $OT Visit: 1 Visit OT Treatments $Therapeutic Activity: 8-22 mins $Neuromuscular Re-education: 8-22 mins  Darleen Crocker, MS, OTR/L , CBIS ascom [phone]  12/21/20, 4:48 PM

## 2020-12-21 NOTE — Progress Notes (Signed)
PROGRESS NOTE    Susan House  CZY:[id number] DOB: 1955-12-07 DOA: 12/14/2020 PCP: Jerrol Banana., MD  Brief Narrative:   65 y.o. female with medical history significant for history of CVA with residual left-sided weakness, left frontal encephalomalacia, CKD stage IIIb-IV, insulin-dependent type 2 diabetes, hypertension, hyperlipidemia, seizure disorder, and memory loss who presents to the ED for evaluation of generalized weakness and abnormal labs.  Patient recently admitted 11/28/2020-12/03/2020 for AKI on CKD managed with IV fluid hydration.  Renal ultrasound showed increased echogenicity compatible with medical renal disease and a 2.7 cm right kidney cyst.  She was discharged to SNF.  Patient states that she was sent to the ED due to abnormal labs.  Found to have AKI on CKD and evidence of urinary tract infection.  Kidney function appears to have peaked at 3.44.  Now slightly downtrending.  Mental status also slowly improving.  Mental status has been waxing and waning.  Discussed this with the patient's daughter.  I feel this is a progression of underlying vascular dementia and patient if may be established a new psychiatric baseline.  Unfortunately blood pressure has been very difficult to control.  Intravenous fluids were discontinued on 6/5 and patient had subsequent deterioration kidney function 6/6.  Nephrology consult requested for recommendations.      Assessment & Plan:   Principal Problem:   Acute kidney injury superimposed on chronic kidney disease ([place]) Active Problems:   Hypertension associated with diabetes ([place])   Type 2 diabetes mellitus with complications (H[facility])   Hemiparesis due to old cerebrovascular accident ([place])   Hyperlipidemia associated with type 2 diabetes mellitus ([place])   Hx of completed stroke   AKI (acute kidney injury) ([place])   AKI on CKD stage IIIb-IV Recently admitted for the same.   Renal function progressively worsening.  Similar  to previous presentation likely etiology is ATN in the setting of poorly controlled hypertension as well as possible relative dehydration/weight depletion Creat: 3.44-->3.38-->3.25-->2.92-->3.19-->3.06 Renal ultrasound medical renal disease Nephrology consulted 6/6 Plan: Defer decision regarding intravenous fluid resuscitation nephrology Resumed Vasotec Avoid NSAIDs and nonessential nephrotoxins Check renal ultrasound with Doppler rule out renal artery stenosis Follow any further nephrology recommendations  Refractory hypertension Poor control over interval Likely contributing to some of the deterioration in kidney function Blood pressure remains elevated despite extensive regimen Plan: Continue amlodipine, clonidine, hydralazine at current doses Continue metoprolol titrate 100 twice daily Continue Imdur 30 mg daily Restart Vasotec 5 mg daily Check a renal artery ultrasound to work-up possible renovascular hypertension Appreciate nephrology recommendations   Urinary tract infection Patient unclear exhibiting clear symptoms of UTI however leukocyte esterase and nitrite positive UA Endorsement of symptoms could be clouded by underlying vascular dementia Will elect to treat as acute UTI Unfortunately it appears urine culture was not sent from initial UA Completed 3 days of Rocephin Plan: No further antibiotics at this point   Anemia of chronic kidney disease and B12 deficiency Hemoglobin 7.8 on admission.  Patient denies any obvious bleeding.  Continue B12 supplement and monitor for any signs/symptoms of bleeding.  Insulin-dependent type 2 diabetes A1c 8.6%.  Continue Lantus 12 units nightly and SSI.  History of CVA with residual left-sided weakness/left frontal encephalomalacia Continue aspirin, Plavix, atorvastatin.  Seizure disorder Continue Keppra Seizure precaution  Memory loss Suspected underlying vascular dementia.   She had some reported confusion/delirium  prior to arrival.   Baseline level of mentation is unclear CT head significant for chronic volume loss and small vessel  ischemic changes consistent with suspected vascular dementia Outpatient follow-up   DVT prophylaxis: SQ heparin Code Status: DNR Family Communication:  Daughter [name] [phone] on 6/5, husband at bedside 6/7 Disposition Plan: Status is: Inpatient  Remains inpatient appropriate because:Inpatient level of care appropriate due to severity of illness   Dispo: The patient is from: SNF              Anticipated d/c is to: SNF              Patient currently is not medically stable to d/c.   Difficult to place patient No   AKI on CKD.  Kidney function remains poor.  Blood pressure control remains a challenge.  Renal artery stenosis on differential.  Renal artery ultrasound ordered and pending.  Possible disposition in 24 hours if blood pressure control improved.   Level of care: Med-Surg  Consultants:   None  Procedures:   None  Antimicrobials:   Ceftriaxone, start 6/1   Subjective: Patient seen and examined.  Husband at bedside.  Patient with no complaints this morning.  Feels well  Objective: Vitals:   12/21/20 0436 12/21/20 0606 12/21/20 0741 12/21/20 1109  BP: (!) 148/60  (!) 182/71 (!) 119/56  Pulse: (!) 58  65 (!) 58  Resp: 16  16 18   Temp: 98 F (36.7 C)  98 F (36.7 C) 97.7 F (36.5 C)  TempSrc: Oral  Oral Oral  SpO2: 97%  97% 97%  Weight:  92.5 kg      Intake/Output Summary (Last 24 hours) at 12/21/2020 1133 Last data filed at 12/21/2020 0900 Gross per 24 hour  Intake 840 ml  Output 1700 ml  Net -860 ml   Filed Weights   12/17/20 0500 12/20/20 0500 12/21/20 0606  Weight: 77.3 kg 92.3 kg 92.5 kg    Examination:  General exam: Mild distress.  Appears fatigued Respiratory system: Lungs clear.  Normal work of breathing.  Room air Cardiovascular system: S1 & S2 heard, RRR. No JVD, murmurs, rubs, gallops or clicks.  No pitting  edema. Gastrointestinal system: Abdomen is nondistended, soft and nontender. No organomegaly or masses felt. Normal bowel sounds heard. Central nervous system: Alert and oriented x2.  No focal deficits Extremities: Symmetric 5 x 5 power. Skin: No rashes, lesions or ulcers Psychiatry: Judgement and insight appear impaired. Mood & affect flattened.     Data Reviewed: I have personally reviewed following labs and imaging studies  CBC: Recent Labs  Lab 12/17/20 0749 12/18/20 0421 12/19/20 0426 12/20/20 0453 12/21/20 0503  WBC 5.7 6.6 6.8 6.9 6.8  NEUTROABS 2.9 3.6 3.7 4.1 3.4  HGB 7.4* 7.3* 7.2* 7.5* 7.6*  HCT 22.1* 22.9* 21.5* 22.6* 23.5*  MCV 85.3 87.4 86.0 86.3 87.7  PLT 253 273 272 278 973   Basic Metabolic Panel: Recent Labs  Lab 12/17/20 0749 12/18/20 0421 12/19/20 0426 12/20/20 0453 12/21/20 0503  NA 140 141 140 138 136  K 4.0 3.7 3.6 3.9 3.7  CL 112* 113* 111 110 108  CO2 18* 20* 21* 21* 21*  GLUCOSE 90 176* 192* 216* 112*  BUN 49* 46* 48* 49* 48*  CREATININE 3.38* 3.25* 2.92* 3.19* 3.06*  CALCIUM 8.4* 8.5* 8.3* 8.3* 8.5*   GFR: Estimated Creatinine Clearance: 19.7 mL/min (A) (by C-G formula based on SCr of 3.06 mg/dL (H)). Liver Function Tests: No results for input(s): AST, ALT, ALKPHOS, BILITOT, PROT, ALBUMIN in the last 168 hours. No results for input(s): LIPASE, AMYLASE in the last 168 hours.  No results for input(s): AMMONIA in the last 168 hours. Coagulation Profile: No results for input(s): INR, PROTIME in the last 168 hours. Cardiac Enzymes: No results for input(s): CKTOTAL, CKMB, CKMBINDEX, TROPONINI in the last 168 hours. BNP (last 3 results) No results for input(s): PROBNP in the last 8760 hours. HbA1C: No results for input(s): HGBA1C in the last 72 hours. CBG: Recent Labs  Lab 12/20/20 1126 12/20/20 1652 12/20/20 2212 12/21/20 0745 12/21/20 1106  GLUCAP 195* 153* 211* 110* 255*   Lipid Profile: No results for input(s): CHOL, HDL,  LDLCALC, TRIG, CHOLHDL, LDLDIRECT in the last 72 hours. Thyroid Function Tests: No results for input(s): TSH, T4TOTAL, FREET4, T3FREE, THYROIDAB in the last 72 hours. Anemia Panel: No results for input(s): VITAMINB12, FOLATE, FERRITIN, TIBC, IRON, RETICCTPCT in the last 72 hours. Sepsis Labs: Recent Labs  Lab 12/15/20 0418  PROCALCITON 0.60    Recent Results (from the past 240 hour(s))  SARS CORONAVIRUS 2 (TAT 6-24 HRS) Nasopharyngeal Nasopharyngeal Swab     Status: None   Collection Time: 12/14/20  8:28 PM   Specimen: Nasopharyngeal Swab  Result Value Ref Range Status   SARS Coronavirus 2 NEGATIVE NEGATIVE Final    Comment: (NOTE) SARS-CoV-2 target nucleic acids are NOT DETECTED.  The SARS-CoV-2 RNA is generally detectable in upper and lower respiratory specimens during the acute phase of infection. Negative results do not preclude SARS-CoV-2 infection, do not rule out co-infections with other pathogens, and should not be used as the sole basis for treatment or other patient management decisions. Negative results must be combined with clinical observations, patient history, and epidemiological information. The expected result is Negative.  Fact Sheet for Patients: SugarRoll.be  Fact Sheet for Healthcare Providers: https://www.woods-mathews.com/  This test is not yet approved or cleared by the [place] FDA and  has been authorized for detection and/or diagnosis of SARS-CoV-2 by FDA under an Emergency Use Authorization (EUA). This EUA will remain  in effect (meaning this test can be used) for the duration of the COVID-19 declaration under Se ction 564(b)(1) of the Act, 21 U.S.C. section 360bbb-3(b)(1), unless the authorization is terminated or revoked sooner.  Performed at [facility], [place] [address], [place], [place] [address]   SARS CORONAVIRUS 2 (TAT 6-24 HRS) Nasopharyngeal Nasopharyngeal Swab     Status: None    Collection Time: 12/19/20  4:40 PM   Specimen: Nasopharyngeal Swab  Result Value Ref Range Status   SARS Coronavirus 2 NEGATIVE NEGATIVE Final    Comment: (NOTE) SARS-CoV-2 target nucleic acids are NOT DETECTED.  The SARS-CoV-2 RNA is generally detectable in upper and lower respiratory specimens during the acute phase of infection. Negative results do not preclude SARS-CoV-2 infection, do not rule out co-infections with other pathogens, and should not be used as the sole basis for treatment or other patient management decisions. Negative results must be combined with clinical observations, patient history, and epidemiological information. The expected result is Negative.  Fact Sheet for Patients: SugarRoll.be  Fact Sheet for Healthcare Providers: https://www.woods-mathews.com/  This test is not yet approved or cleared by the [place] FDA and  has been authorized for detection and/or diagnosis of SARS-CoV-2 by FDA under an Emergency Use Authorization (EUA). This EUA will remain  in effect (meaning this test can be used) for the duration of the COVID-19 declaration under Se ction 564(b)(1) of the Act, 21 U.S.C. section 360bbb-3(b)(1), unless the authorization is terminated or revoked sooner.  Performed at [facility], [address] [place], [place] [address]          Radiology Studies: No results found.      Scheduled Meds: . amLODipine  10 mg Oral Daily  . aspirin  81 mg Oral Daily  . atorvastatin  20 mg Oral QHS  . cloNIDine  0.3 mg Oral BID  . clopidogrel  75 mg Oral Daily  . enalapril  5 mg Oral Daily  . gabapentin  200 mg Oral BID  . heparin  5,000 Units Subcutaneous Q8H  . hydrALAZINE  100 mg Oral TID  . insulin aspart  0-15 Units Subcutaneous TID WC  . insulin glargine  10 Units Subcutaneous QHS  . isosorbide mononitrate  30 mg Oral Daily  . levETIRAcetam  500 mg Oral BID  . metoprolol tartrate  100  mg Oral BID  . nystatin  1 application Topical BID  . pantoprazole  40 mg Oral Daily  . vitamin B-12  250 mcg Oral Daily   Continuous Infusions:    LOS: 5 days    Time spent: 25 minutes    Sidney Ace, MD [facility] Pager [phone]  If 7PM-7AM, please contact night-coverage 12/21/2020, 11:33 AM

## 2020-12-21 NOTE — Progress Notes (Signed)
[facility]  ROUNDING NOTE   Subjective:   Susan House is a 65 y.o. female with medical conditions including CVA with left sided weakness, Type 2 diabetes, hyperlipidemia, seizures, hypertension and CKD 3b. Patient presents ED with weakness and abnormal labs.   Patient seen today resting in bed Eating breakfast Tolerating meals, improved appetite Oriented x 2   Objective:  Vital signs in last 24 hours:  Temp:  [97.7 F (36.5 C)-98.1 F (36.7 C)] 97.7 F (36.5 C) (06/07 1109) Pulse Rate:  [58-65] 58 (06/07 1109) Resp:  [16-20] 18 (06/07 1109) BP: (119-182)/(56-71) 119/56 (06/07 1109) SpO2:  [97 %-99 %] 97 % (06/07 1109) Weight:  [92.5 kg] 92.5 kg (06/07 0606)  Weight change: 0.2 kg Filed Weights   12/17/20 0500 12/20/20 0500 12/21/20 0606  Weight: 77.3 kg 92.3 kg 92.5 kg    Intake/Output: I/O last 3 completed shifts: In: 840 [P.O.:840] Out: 3400 [Urine:3400]   Intake/Output this shift:  Total I/O In: 360 [P.O.:360] Out: -   Physical Exam: General: NAD, laying in bed  Head: Normocephalic, atraumatic. Moist oral mucosal membranes  Eyes: Anicteric  Lungs:  Clear ,normal breathing effort  Heart: Regular rate and rhythm  Abdomen:  Soft, nontender   Extremities:  no peripheral edema.  Neurologic: Drowsy, arousable, moving all four extremities  Skin: No lesions       Basic Metabolic Panel: Recent Labs  Lab 12/17/20 0749 12/18/20 0421 12/19/20 0426 12/20/20 0453 12/21/20 0503  NA 140 141 140 138 136  K 4.0 3.7 3.6 3.9 3.7  CL 112* 113* 111 110 108  CO2 18* 20* 21* 21* 21*  GLUCOSE 90 176* 192* 216* 112*  BUN 49* 46* 48* 49* 48*  CREATININE 3.38* 3.25* 2.92* 3.19* 3.06*  CALCIUM 8.4* 8.5* 8.3* 8.3* 8.5*    Liver Function Tests: No results for input(s): AST, ALT, ALKPHOS, BILITOT, PROT, ALBUMIN in the last 168 hours. No results for input(s): LIPASE, AMYLASE in the last 168 hours. No results for input(s): AMMONIA in the last 168  hours.  CBC: Recent Labs  Lab 12/17/20 0749 12/18/20 0421 12/19/20 0426 12/20/20 0453 12/21/20 0503  WBC 5.7 6.6 6.8 6.9 6.8  NEUTROABS 2.9 3.6 3.7 4.1 3.4  HGB 7.4* 7.3* 7.2* 7.5* 7.6*  HCT 22.1* 22.9* 21.5* 22.6* 23.5*  MCV 85.3 87.4 86.0 86.3 87.7  PLT 253 273 272 278 277    Cardiac Enzymes: No results for input(s): CKTOTAL, CKMB, CKMBINDEX, TROPONINI in the last 168 hours.  BNP: Invalid input(s): POCBNP  CBG: Recent Labs  Lab 12/20/20 1126 12/20/20 1652 12/20/20 2212 12/21/20 0745 12/21/20 1106  GLUCAP 195* 153* 211* 110* 255*    Microbiology: Results for orders placed or performed during the hospital encounter of 12/14/20  SARS CORONAVIRUS 2 (TAT 6-24 HRS) Nasopharyngeal Nasopharyngeal Swab     Status: None   Collection Time: 12/14/20  8:28 PM   Specimen: Nasopharyngeal Swab  Result Value Ref Range Status   SARS Coronavirus 2 NEGATIVE NEGATIVE Final    Comment: (NOTE) SARS-CoV-2 target nucleic acids are NOT DETECTED.  The SARS-CoV-2 RNA is generally detectable in upper and lower respiratory specimens during the acute phase of infection. Negative results do not preclude SARS-CoV-2 infection, do not rule out co-infections with other pathogens, and should not be used as the sole basis for treatment or other patient management decisions. Negative results must be combined with clinical observations, patient history, and epidemiological information. The expected result is Negative.  Fact Sheet  for Patients: SugarRoll.be  Fact Sheet for Healthcare Providers: https://www.woods-mathews.com/  This test is not yet approved or cleared by the [place] FDA and  has been authorized for detection and/or diagnosis of SARS-CoV-2 by FDA under an Emergency Use Authorization (EUA). This EUA will remain  in effect (meaning this test can be used) for the duration of the COVID-19 declaration under Se ction 564(b)(1) of the Act,  21 U.S.C. section 360bbb-3(b)(1), unless the authorization is terminated or revoked sooner.  Performed at [facility], [place] [address], [place], [place] [address]   SARS CORONAVIRUS 2 (TAT 6-24 HRS) Nasopharyngeal Nasopharyngeal Swab     Status: None   Collection Time: 12/19/20  4:40 PM   Specimen: Nasopharyngeal Swab  Result Value Ref Range Status   SARS Coronavirus 2 NEGATIVE NEGATIVE Final    Comment: (NOTE) SARS-CoV-2 target nucleic acids are NOT DETECTED.  The SARS-CoV-2 RNA is generally detectable in upper and lower respiratory specimens during the acute phase of infection. Negative results do not preclude SARS-CoV-2 infection, do not rule out co-infections with other pathogens, and should not be used as the sole basis for treatment or other patient management decisions. Negative results must be combined with clinical observations, patient history, and epidemiological information. The expected result is Negative.  Fact Sheet for Patients: SugarRoll.be  Fact Sheet for Healthcare Providers: https://www.woods-mathews.com/  This test is not yet approved or cleared by the [place] FDA and  has been authorized for detection and/or diagnosis of SARS-CoV-2 by FDA under an Emergency Use Authorization (EUA). This EUA will remain  in effect (meaning this test can be used) for the duration of the COVID-19 declaration under Se ction 564(b)(1) of the Act, 21 U.S.C. section 360bbb-3(b)(1), unless the authorization is terminated or revoked sooner.  Performed at [facility], [place] [address] [place], [place] [address]     Coagulation Studies: No results for input(s): LABPROT, INR in the last 72 hours.  Urinalysis: No results for input(s): COLORURINE, LABSPEC, PHURINE, GLUCOSEU, HGBUR, BILIRUBINUR, KETONESUR, PROTEINUR, UROBILINOGEN, NITRITE, LEUKOCYTESUR in the last 72 hours.  Invalid input(s): APPERANCEUR     Imaging: No results found.   Medications:    . amLODipine  10 mg Oral Daily  . aspirin  81 mg Oral Daily  . atorvastatin  20 mg Oral QHS  . cloNIDine  0.3 mg Oral BID  . clopidogrel  75 mg Oral Daily  . enalapril  5 mg Oral Daily  . gabapentin  200 mg Oral BID  . heparin  5,000 Units Subcutaneous Q8H  . hydrALAZINE  100 mg Oral TID  . insulin aspart  0-15 Units Subcutaneous TID WC  . insulin glargine  10 Units Subcutaneous QHS  . isosorbide mononitrate  30 mg Oral Daily  . levETIRAcetam  500 mg Oral BID  . metoprolol tartrate  100 mg Oral BID  . nystatin  1 application Topical BID  . pantoprazole  40 mg Oral Daily  . vitamin B-12  250 mcg Oral Daily   acetaminophen **OR** acetaminophen, hydrocortisone, ondansetron **OR** ondansetron (ZOFRAN) IV, senna-docusate  Assessment/ Plan:  Susan House is a 65 y.o.  female who was admitted to [facility] on 11/28/2020 for AKI on CKD. Patient has previous medical conditions including diabetes, hypertension, CVA with left hemiplegia residual, morbid obesity and chronic kidney disease stage 3B. She presents to the ED with weakness and abnormal labs.    1. Acute Kidney Injury on chronic kidney disease stage 3B  with baseline creatinine 2.32 and GFR of 23 on 12/03/20.  Acute kidney injury secondary to ATN due to poor intake vs uncontrolled hypertension Chronic kidney disease is secondary to uncontrolled diabetes No acute need for dialysis No IV contrast exposure Renal US shows simple renal cyst, but no obstruction Renal function has fluctuated during thi admission  Currently 3.06 with UOP 1.7L IVF-NS @100  ml/hr x 5 hrs yesterday Continue encouraging oral nutrition Will monitor   Lab Results  Component Value Date   CREATININE 3.06 (H) 12/21/2020   CREATININE 3.19 (H) 12/20/2020   CREATININE 2.92 (H) 12/19/2020    Intake/Output Summary (Last 24 hours) at 12/21/2020 1244 Last data filed at 12/21/2020 0900 Gross per 24 hour   Intake 840 ml  Output 1700 ml  Net -860 ml    2. Hypertension:  Home regimen includes Amlodipine, Clonidine, Enalapril, Hydralazine, and Metoprolol.  Enalapril 5 mg restarted Remains elevated Primary team to order renal doppler to evaluate renovascular hypertension  3. Diabetes mellitus type II with chronic kidney disease Insulin dependent with Metformin and Lantus prescribed for home Hgb A1c 8.6 on 11/29/20 Elevated  SSi ordered for management     LOS: 5   6/7/202212:44 PM

## 2020-12-22 ENCOUNTER — Inpatient Hospital Stay: Payer: Medicare Other

## 2020-12-22 DIAGNOSIS — I69359 Hemiplegia and hemiparesis following cerebral infarction affecting unspecified side: Secondary | ICD-10-CM

## 2020-12-22 DIAGNOSIS — Z8673 Personal history of transient ischemic attack (TIA), and cerebral infarction without residual deficits: Secondary | ICD-10-CM

## 2020-12-22 DIAGNOSIS — N189 Chronic kidney disease, unspecified: Secondary | ICD-10-CM

## 2020-12-22 DIAGNOSIS — E785 Hyperlipidemia, unspecified: Secondary | ICD-10-CM

## 2020-12-22 DIAGNOSIS — E1169 Type 2 diabetes mellitus with other specified complication: Secondary | ICD-10-CM

## 2020-12-22 DIAGNOSIS — N179 Acute kidney failure, unspecified: Secondary | ICD-10-CM

## 2020-12-22 LAB — BASIC METABOLIC PANEL
Anion gap: 11 (ref 5–15)
BUN: 55 mg/dL — ABNORMAL HIGH (ref 8–23)
CO2: 21 mmol/L — ABNORMAL LOW (ref 22–32)
Calcium: 8.6 mg/dL — ABNORMAL LOW (ref 8.9–10.3)
Chloride: 106 mmol/L (ref 98–111)
Creatinine, Ser: 3.45 mg/dL — ABNORMAL HIGH (ref 0.44–1.00)
GFR, Estimated: 14 mL/min — ABNORMAL LOW (ref >60)
Glucose, Bld: 151 mg/dL — ABNORMAL HIGH (ref 70–99)
Potassium: 4 mmol/L (ref 3.5–5.1)
Sodium: 138 mmol/L (ref 135–145)

## 2020-12-22 LAB — GLUCOSE, CAPILLARY
Glucose-Capillary: 163 mg/dL — ABNORMAL HIGH (ref 70–99)
Glucose-Capillary: 180 mg/dL — ABNORMAL HIGH (ref 70–99)
Glucose-Capillary: 243 mg/dL — ABNORMAL HIGH (ref 70–99)

## 2020-12-22 LAB — CBC WITH DIFFERENTIAL/PLATELET
Abs Immature Granulocytes: 0.1 10*3/uL — ABNORMAL HIGH (ref 0.00–0.07)
Basophils Absolute: 0.1 10*3/uL (ref 0.0–0.1)
Basophils Relative: 1 %
Eosinophils Absolute: 0.6 10*3/uL — ABNORMAL HIGH (ref 0.0–0.5)
Eosinophils Relative: 6 %
HCT: 23.8 % — ABNORMAL LOW (ref 36.0–46.0)
Hemoglobin: 7.7 g/dL — ABNORMAL LOW (ref 12.0–15.0)
Immature Granulocytes: 1 %
Lymphocytes Relative: 18 %
Lymphs Abs: 1.6 10*3/uL (ref 0.7–4.0)
MCH: 28.2 pg (ref 26.0–34.0)
MCHC: 32.4 g/dL (ref 30.0–36.0)
MCV: 87.2 fL (ref 80.0–100.0)
Monocytes Absolute: 0.9 10*3/uL (ref 0.1–1.0)
Monocytes Relative: 10 %
Neutro Abs: 5.5 10*3/uL (ref 1.7–7.7)
Neutrophils Relative %: 64 %
Platelets: 280 10*3/uL (ref 150–400)
RBC: 2.73 MIL/uL — ABNORMAL LOW (ref 3.87–5.11)
RDW: 14.2 % (ref 11.5–15.5)
WBC: 8.7 10*3/uL (ref 4.0–10.5)
nRBC: 0 % (ref 0.0–0.2)

## 2020-12-22 MED ORDER — ISOSORBIDE MONONITRATE ER 30 MG PO TB24
60.0000 mg | ORAL_TABLET | Freq: Every day | ORAL | Status: DC
  Administered 2020-12-23: 60 mg via ORAL
  Filled 2020-12-22: qty 2

## 2020-12-22 MED ORDER — IOHEXOL 9 MG/ML PO SOLN: 500.0000 mL | Freq: Once | ORAL | Status: DC | PRN

## 2020-12-22 MED ORDER — POLYETHYLENE GLYCOL 3350 17 G PO PACK
17.0000 g | PACK | Freq: Every day | ORAL | Status: DC
  Administered 2020-12-22 – 2021-01-19 (×24): 17 g via ORAL
  Filled 2020-12-22 (×28): qty 1

## 2020-12-22 MED ORDER — HYDROCODONE-ACETAMINOPHEN 5-325 MG PO TABS
1.0000 | ORAL_TABLET | ORAL | Status: DC | PRN
  Administered 2020-12-22 – 2021-01-10 (×11): 1 via ORAL
  Filled 2020-12-22 (×12): qty 1

## 2020-12-22 MED ORDER — IOHEXOL 9 MG/ML PO SOLN
500.0000 mL | ORAL | Status: AC
  Administered 2020-12-22: 500 mL via ORAL

## 2020-12-22 MED ORDER — SENNOSIDES-DOCUSATE SODIUM 8.6-50 MG PO TABS
2.0000 | ORAL_TABLET | Freq: Two times a day (BID) | ORAL | Status: DC
  Administered 2020-12-22 – 2021-01-19 (×48): 2 via ORAL
  Filled 2020-12-22 (×51): qty 2

## 2020-12-22 NOTE — Progress Notes (Signed)
[facility]  ROUNDING NOTE   Subjective:   Susan House is a 65 y.o. female with medical conditions including CVA with left sided weakness, Type 2 diabetes, hyperlipidemia, seizures, hypertension and CKD 3b. Patient presents ED with weakness and abnormal labs.   Patient seen today resting in bed Currently NPO for procedure Complains of abdominal pain Patient seen later after procedure Complains of increased abdominal pain    Objective:  Vital signs in last 24 hours:  Temp:  [97.6 F (36.4 C)-98.7 F (37.1 C)] 98.1 F (36.7 C) (06/08 0823) Pulse Rate:  [58-68] 62 (06/08 0823) Resp:  [15-18] 15 (06/08 0823) BP: (119-160)/(49-67) 160/49 (06/08 0823) SpO2:  [96 %-99 %] 96 % (06/08 0823)  Weight change:  Filed Weights   12/17/20 0500 12/20/20 0500 12/21/20 0606  Weight: 77.3 kg 92.3 kg 92.5 kg    Intake/Output: I/O last 3 completed shifts: In: 840 [P.O.:840] Out: 1000 [Urine:1000]   Intake/Output this shift:  No intake/output data recorded.  Physical Exam: General: NAD, laying in bed  Head: Normocephalic, atraumatic. Moist oral mucosal membranes  Eyes: Anicteric  Lungs:  Clear ,normal breathing effort  Heart: Regular rate and rhythm  Abdomen:  Soft, nontender   Extremities:  no peripheral edema.  Neurologic: Drowsy, arousable, moving all four extremities  Skin: No lesions       Basic Metabolic Panel: Recent Labs  Lab 12/18/20 0421 12/19/20 0426 12/20/20 0453 12/21/20 0503 12/22/20 0420  NA 141 140 138 136 138  K 3.7 3.6 3.9 3.7 4.0  CL 113* 111 110 108 106  CO2 20* 21* 21* 21* 21*  GLUCOSE 176* 192* 216* 112* 151*  BUN 46* 48* 49* 48* 55*  CREATININE 3.25* 2.92* 3.19* 3.06* 3.45*  CALCIUM 8.5* 8.3* 8.3* 8.5* 8.6*    Liver Function Tests: No results for input(s): AST, ALT, ALKPHOS, BILITOT, PROT, ALBUMIN in the last 168 hours. No results for input(s): LIPASE, AMYLASE in the last 168 hours. No results for input(s): AMMONIA in  the last 168 hours.  CBC: Recent Labs  Lab 12/18/20 0421 12/19/20 0426 12/20/20 0453 12/21/20 0503 12/22/20 0420  WBC 6.6 6.8 6.9 6.8 8.7  NEUTROABS 3.6 3.7 4.1 3.4 5.5  HGB 7.3* 7.2* 7.5* 7.6* 7.7*  HCT 22.9* 21.5* 22.6* 23.5* 23.8*  MCV 87.4 86.0 86.3 87.7 87.2  PLT 273 272 278 277 280    Cardiac Enzymes: No results for input(s): CKTOTAL, CKMB, CKMBINDEX, TROPONINI in the last 168 hours.  BNP: Invalid input(s): POCBNP  CBG: Recent Labs  Lab 12/21/20 0745 12/21/20 1106 12/21/20 1632 12/21/20 2112 12/22/20 0752  GLUCAP 110* 255* 172* 169* 163*    Microbiology: Results for orders placed or performed during the hospital encounter of 12/14/20  SARS CORONAVIRUS 2 (TAT 6-24 HRS) Nasopharyngeal Nasopharyngeal Swab     Status: None   Collection Time: 12/14/20  8:28 PM   Specimen: Nasopharyngeal Swab  Result Value Ref Range Status   SARS Coronavirus 2 NEGATIVE NEGATIVE Final    Comment: (NOTE) SARS-CoV-2 target nucleic acids are NOT DETECTED.  The SARS-CoV-2 RNA is generally detectable in upper and lower respiratory specimens during the acute phase of infection. Negative results do not preclude SARS-CoV-2 infection, do not rule out co-infections with other pathogens, and should not be used as the sole basis for treatment or other patient management decisions. Negative results must be combined with clinical observations, patient history, and epidemiological information. The expected result is Negative.  Fact Sheet for Patients: SugarRoll.be  Fact Sheet for Healthcare Providers: https://www.woods-mathews.com/  This test is not yet approved or cleared by the [place] FDA and  has been authorized for detection and/or diagnosis of SARS-CoV-2 by FDA under an Emergency Use Authorization (EUA). This EUA will remain  in effect (meaning this test can be used) for the duration of the COVID-19 declaration under Se ction  564(b)(1) of the Act, 21 U.S.C. section 360bbb-3(b)(1), unless the authorization is terminated or revoked sooner.  Performed at [facility], [place] [address], [place], [place] [address]   SARS CORONAVIRUS 2 (TAT 6-24 HRS) Nasopharyngeal Nasopharyngeal Swab     Status: None   Collection Time: 12/19/20  4:40 PM   Specimen: Nasopharyngeal Swab  Result Value Ref Range Status   SARS Coronavirus 2 NEGATIVE NEGATIVE Final    Comment: (NOTE) SARS-CoV-2 target nucleic acids are NOT DETECTED.  The SARS-CoV-2 RNA is generally detectable in upper and lower respiratory specimens during the acute phase of infection. Negative results do not preclude SARS-CoV-2 infection, do not rule out co-infections with other pathogens, and should not be used as the sole basis for treatment or other patient management decisions. Negative results must be combined with clinical observations, patient history, and epidemiological information. The expected result is Negative.  Fact Sheet for Patients: SugarRoll.be  Fact Sheet for Healthcare Providers: https://www.woods-mathews.com/  This test is not yet approved or cleared by the [place] FDA and  has been authorized for detection and/or diagnosis of SARS-CoV-2 by FDA under an Emergency Use Authorization (EUA). This EUA will remain  in effect (meaning this test can be used) for the duration of the COVID-19 declaration under Se ction 564(b)(1) of the Act, 21 U.S.C. section 360bbb-3(b)(1), unless the authorization is terminated or revoked sooner.  Performed at [facility], [place] [address] [place], [place] [address]     Coagulation Studies: No results for input(s): LABPROT, INR in the last 72 hours.  Urinalysis: No results for input(s): COLORURINE, LABSPEC, PHURINE, GLUCOSEU, HGBUR, BILIRUBINUR, KETONESUR, PROTEINUR, UROBILINOGEN, NITRITE, LEUKOCYTESUR in the last 72 hours.  Invalid input(s):  APPERANCEUR    Imaging: No results found.   Medications:    . amLODipine  10 mg Oral Daily  . aspirin  81 mg Oral Daily  . atorvastatin  20 mg Oral QHS  . cloNIDine  0.3 mg Oral BID  . clopidogrel  75 mg Oral Daily  . enalapril  5 mg Oral Daily  . gabapentin  200 mg Oral BID  . heparin  5,000 Units Subcutaneous Q8H  . hydrALAZINE  100 mg Oral TID  . insulin aspart  0-15 Units Subcutaneous TID WC  . insulin glargine  10 Units Subcutaneous QHS  . isosorbide mononitrate  30 mg Oral Daily  . levETIRAcetam  500 mg Oral BID  . metoprolol tartrate  100 mg Oral BID  . nystatin  1 application Topical BID  . pantoprazole  40 mg Oral Daily  . polyethylene glycol  17 g Oral Daily  . senna-docusate  2 tablet Oral BID  . vitamin B-12  250 mcg Oral Daily   acetaminophen **OR** acetaminophen, HYDROcodone-acetaminophen, hydrocortisone, ondansetron **OR** ondansetron (ZOFRAN) IV, senna-docusate  Assessment/ Plan:  Ms. Pamala Hayman is a 65 y.o.  female who was admitted to [facility] on 11/28/2020 for AKI on CKD. Patient has previous medical conditions including diabetes, hypertension, CVA with left hemiplegia residual, morbid obesity and chronic kidney disease stage 3B. She presents to the ED with weakness and abnormal  labs.    1. Acute Kidney Injury on chronic kidney disease stage 3B with baseline creatinine 2.32 and GFR of 23 on 12/03/20.  Acute kidney injury secondary to ATN due to poor intake vs uncontrolled hypertension Chronic kidney disease is secondary to uncontrolled diabetes No acute need for dialysis No IV contrast exposure Renal US shows simple renal cyst, but no obstruction Renal function has fluctuated during thi admission  Currently 3.45 Creatinine increase expected with restart of Enalapril Will monitor   Lab Results  Component Value Date   CREATININE 3.45 (H) 12/22/2020   CREATININE 3.06 (H) 12/21/2020   CREATININE 3.19 (H) 12/20/2020    Intake/Output Summary  (Last 24 hours) at 12/22/2020 1024 Last data filed at 12/22/2020 1021 Gross per 24 hour  Intake 480 ml  Output --  Net 480 ml    2. Hypertension:  Home regimen includes Amlodipine, Clonidine, Enalapril, Hydralazine, and Metoprolol.  Enalapril 5 mg restarted Renal doppler completed this morning to evlauate renovascular hypertension. Findings consist of cystic mass in the right abdomen. Further evaluation requested  3. Diabetes mellitus type II with chronic kidney disease Insulin dependent with Metformin and Lantus prescribed for home Hgb A1c 8.6 on 11/29/20 Elevated  SSi ordered for management     LOS: 6   6/8/202210:24 AM

## 2020-12-22 NOTE — Progress Notes (Signed)
PROGRESS NOTE    Susan House  DJS:[id number] DOB: 18-Mar-1956 DOA: 12/14/2020 PCP: Jerrol Banana., MD  Brief Narrative:   65 y.o. female with medical history significant for history of CVA with residual left-sided weakness, left frontal encephalomalacia, CKD stage IIIb-IV, insulin-dependent type 2 diabetes, hypertension, hyperlipidemia, seizure disorder, and memory loss who presents to the ED for evaluation of generalized weakness and abnormal labs.  Patient recently admitted 11/28/2020-12/03/2020 for AKI on CKD managed with IV fluid hydration.  Renal ultrasound showed increased echogenicity compatible with medical renal disease and a 2.7 cm right kidney cyst.  She was discharged to SNF.  Patient states that she was sent to the ED due to abnormal labs.  Found to have AKI on CKD and evidence of urinary tract infection.  Kidney function appears to have peaked at 3.44.  Now slightly downtrending.  Mental status also slowly improving.  Mental status has been waxing and waning.  Discussed this with the patient's daughter.  I feel this is a progression of underlying vascular dementia and patient if may be established a new psychiatric baseline.  Unfortunately blood pressure has been very difficult to control.  Intravenous fluids were discontinued on 6/5 and patient had subsequent deterioration kidney function 6/6.  Nephrology consult requested for recommendations.      Assessment & Plan:   Principal Problem:   Acute kidney injury superimposed on chronic kidney disease ([place]) Active Problems:   Hypertension associated with diabetes ([place])   Type 2 diabetes mellitus with complications ([facility])   Hemiparesis due to old cerebrovascular accident ([place])   Hyperlipidemia associated with type 2 diabetes mellitus ([place])   Hx of completed stroke   AKI (acute kidney injury) ([place])   AKI on CKD stage IIIb Recently admitted for the same.   Renal function progressively worsening.  Similar to  previous presentation likely etiology is ATN in the setting of poorly controlled hypertension as well as possible relative dehydration/weight depletion Creat: 3.45 Nephrology following. Avoid NSAIDs and nonessential nephrotoxins renal ultrasound with Doppler rule showed 10.8 x 5.6 cm cystic mass on the right side.  Will obtain CT abdomen pelvis for further evaluation of this mass  Refractory hypertension Poor control over interval Likely contributing to some of the deterioration in kidney function Blood pressure remains elevated despite extensive regimen Continue amlodipine, clonidine, hydralazine at current doses Continue metoprolol titrate 100 twice daily -We will increase Imdur dose to 60 mg once daily Continue Vasotec 5 mg daily  Urinary tract infection Patient unclear exhibiting clear symptoms of UTI however leukocyte esterase and nitrite positive UA Endorsement of symptoms could be clouded by underlying vascular dementia Will elect to treat as acute UTI Unfortunately it appears urine culture was not sent from initial UA Completed 3 days of Rocephin Plan: No further antibiotics at this point  Anemia of chronic kidney disease and B12 deficiency Hemoglobin 7.8 on admission.  Patient denies any obvious bleeding.  Continue B12 supplement and monitor for any signs/symptoms of bleeding.  Insulin-dependent type 2 diabetes A1c 8.6%.  Continue Lantus 12 units nightly and SSI.  History of CVA with residual left-sided weakness/left frontal encephalomalacia Continue aspirin, Plavix, atorvastatin.  Seizure disorder Continue Keppra Seizure precaution  Memory loss Suspected underlying vascular dementia.   She had some reported confusion/delirium prior to arrival.   Baseline level of mentation is unclear CT head significant for chronic volume loss and small vessel ischemic changes consistent with suspected vascular dementia Outpatient follow-up  Constipation No bowel movement  since admission according to patient.  We will add Senokot-S and MiraLAX  DVT prophylaxis: SQ heparin Code Status: DNR Family Communication:  Daughter [name] [phone] on 6/5, husband at bedside 6/7.  None on 6/8 Disposition Plan: Status is: Inpatient  Remains inpatient appropriate because:Inpatient level of care appropriate due to severity of illness   Dispo: The patient is from: SNF              Anticipated d/c is to: SNF              Patient currently is not medically stable to d/c.   Difficult to place patient No   AKI on CKD.  Kidney function remains poor and work-up in progress.  Blood pressure control remains a challenge.  Cystic mass on the right side of abdomen is getting further work-up now including CT.     Level of care: Med-Surg  Consultants:   Nephrology  Procedures:   None  Antimicrobials:   Ceftriaxone completed   Subjective: Reports no bowel movement since admission.  Feeling tired  Objective: Vitals:   12/21/20 1517 12/21/20 2108 12/22/20 0500 12/22/20 0823  BP: (!) 131/50 (!) 152/61 (!) 148/67 (!) 160/49  Pulse: (!) 59 65 68 62  Resp:  18 18 15   Temp: 97.6 F (36.4 C) 98.6 F (37 C) 98.7 F (37.1 C) 98.1 F (36.7 C)  TempSrc:   Oral Oral  SpO2: 97% 98% 99% 96%  Weight:        Intake/Output Summary (Last 24 hours) at 12/22/2020 1349 Last data filed at 12/22/2020 1021 Gross per 24 hour  Intake 240 ml  Output --  Net 240 ml   Filed Weights   12/17/20 0500 12/20/20 0500 12/21/20 0606  Weight: 77.3 kg 92.3 kg 92.5 kg    Examination:  General exam: Mild distress.  Appears fatigued Respiratory system: Lungs clear.  Normal work of breathing.  Room air Cardiovascular system: S1 & S2 heard, RRR. No JVD, murmurs, rubs, gallops or clicks.  No pitting edema. Gastrointestinal system: Abdomen is nondistended, soft and nontender. No organomegaly or masses felt. Normal bowel sounds heard. Central nervous system: Alert and oriented x2.  No focal  deficits Extremities: Symmetric 5 x 5 power. Skin: No rashes, lesions or ulcers Psychiatry: Judgement and insight appear impaired. Mood & affect flattened.     Data Reviewed: I have personally reviewed following labs and imaging studies  CBC: Recent Labs  Lab 12/18/20 0421 12/19/20 0426 12/20/20 0453 12/21/20 0503 12/22/20 0420  WBC 6.6 6.8 6.9 6.8 8.7  NEUTROABS 3.6 3.7 4.1 3.4 5.5  HGB 7.3* 7.2* 7.5* 7.6* 7.7*  HCT 22.9* 21.5* 22.6* 23.5* 23.8*  MCV 87.4 86.0 86.3 87.7 87.2  PLT 273 272 278 277 741   Basic Metabolic Panel: Recent Labs  Lab 12/18/20 0421 12/19/20 0426 12/20/20 0453 12/21/20 0503 12/22/20 0420  NA 141 140 138 136 138  K 3.7 3.6 3.9 3.7 4.0  CL 113* 111 110 108 106  CO2 20* 21* 21* 21* 21*  GLUCOSE 176* 192* 216* 112* 151*  BUN 46* 48* 49* 48* 55*  CREATININE 3.25* 2.92* 3.19* 3.06* 3.45*  CALCIUM 8.5* 8.3* 8.3* 8.5* 8.6*   GFR: Estimated Creatinine Clearance: 17.5 mL/min (A) (by C-G formula based on SCr of 3.45 mg/dL (H)). Liver Function Tests: No results for input(s): AST, ALT, ALKPHOS, BILITOT, PROT, ALBUMIN in the last 168 hours. No results for input(s): LIPASE, AMYLASE in the last 168 hours. No results for input(s): AMMONIA  in the last 168 hours. Coagulation Profile: No results for input(s): INR, PROTIME in the last 168 hours. Cardiac Enzymes: No results for input(s): CKTOTAL, CKMB, CKMBINDEX, TROPONINI in the last 168 hours. BNP (last 3 results) No results for input(s): PROBNP in the last 8760 hours. HbA1C: No results for input(s): HGBA1C in the last 72 hours. CBG: Recent Labs  Lab 12/21/20 1106 12/21/20 1632 12/21/20 2112 12/22/20 0752 12/22/20 1137  GLUCAP 255* 172* 169* 163* 180*   Lipid Profile: No results for input(s): CHOL, HDL, LDLCALC, TRIG, CHOLHDL, LDLDIRECT in the last 72 hours. Thyroid Function Tests: No results for input(s): TSH, T4TOTAL, FREET4, T3FREE, THYROIDAB in the last 72 hours. Anemia Panel: No results  for input(s): VITAMINB12, FOLATE, FERRITIN, TIBC, IRON, RETICCTPCT in the last 72 hours. Sepsis Labs: No results for input(s): PROCALCITON, LATICACIDVEN in the last 168 hours.  Recent Results (from the past 240 hour(s))  SARS CORONAVIRUS 2 (TAT 6-24 HRS) Nasopharyngeal Nasopharyngeal Swab     Status: None   Collection Time: 12/14/20  8:28 PM   Specimen: Nasopharyngeal Swab  Result Value Ref Range Status   SARS Coronavirus 2 NEGATIVE NEGATIVE Final    Comment: (NOTE) SARS-CoV-2 target nucleic acids are NOT DETECTED.  The SARS-CoV-2 RNA is generally detectable in upper and lower respiratory specimens during the acute phase of infection. Negative results do not preclude SARS-CoV-2 infection, do not rule out co-infections with other pathogens, and should not be used as the sole basis for treatment or other patient management decisions. Negative results must be combined with clinical observations, patient history, and epidemiological information. The expected result is Negative.  Fact Sheet for Patients: SugarRoll.be  Fact Sheet for Healthcare Providers: https://www.woods-mathews.com/  This test is not yet approved or cleared by the [place] FDA and  has been authorized for detection and/or diagnosis of SARS-CoV-2 by FDA under an Emergency Use Authorization (EUA). This EUA will remain  in effect (meaning this test can be used) for the duration of the COVID-19 declaration under Se ction 564(b)(1) of the Act, 21 U.S.C. section 360bbb-3(b)(1), unless the authorization is terminated or revoked sooner.  Performed at [facility], [place] [address], [place], [place] [address]   SARS CORONAVIRUS 2 (TAT 6-24 HRS) Nasopharyngeal Nasopharyngeal Swab     Status: None   Collection Time: 12/19/20  4:40 PM   Specimen: Nasopharyngeal Swab  Result Value Ref Range Status   SARS Coronavirus 2 NEGATIVE NEGATIVE Final    Comment: (NOTE) SARS-CoV-2  target nucleic acids are NOT DETECTED.  The SARS-CoV-2 RNA is generally detectable in upper and lower respiratory specimens during the acute phase of infection. Negative results do not preclude SARS-CoV-2 infection, do not rule out co-infections with other pathogens, and should not be used as the sole basis for treatment or other patient management decisions. Negative results must be combined with clinical observations, patient history, and epidemiological information. The expected result is Negative.  Fact Sheet for Patients: SugarRoll.be  Fact Sheet for Healthcare Providers: https://www.woods-mathews.com/  This test is not yet approved or cleared by the [place] FDA and  has been authorized for detection and/or diagnosis of SARS-CoV-2 by FDA under an Emergency Use Authorization (EUA). This EUA will remain  in effect (meaning this test can be used) for the duration of the COVID-19 declaration under Se ction 564(b)(1) of the Act, 21 U.S.C. section 360bbb-3(b)(1), unless the authorization is terminated or revoked sooner.  Performed at [facility], [place] [address], [place], [place]  [address]          Radiology Studies: US RENAL ARTERY DUPLEX COMPLETE  Result Date: 12/22/2020 CLINICAL DATA:  Renal vascular hypertension EXAM: RENAL/URINARY TRACT ULTRASOUND RENAL DUPLEX DOPPLER ULTRASOUND COMPARISON:  Ultrasound kidneys 12/16/2020 FINDINGS: Right Kidney: Length: 10.2 cm. Mild increased cortical echogenicity without significant thinning. No mass or hydronephrosis. Two simple right renal cysts with the larger measuring 2.4 cm. Left Kidney: Length: 11.0 cm. Mild increased cortical echogenicity without significant thinning. No mass or hydronephrosis. Septated cyst noted in the mid left kidney measuring 1.9 cm. Bladder:  Not visualized. RENAL DUPLEX ULTRASOUND Right Renal Artery Velocities: Origin:  86 cm/sec Mid:  66 cm/sec [place]:  28  cm/sec Interlobar:  10 cm/sec Arcuate:  11 cm/sec Left Renal Artery Velocities: Origin:  57 cm/sec Mid:  69 cm/sec [place]:  24 cm/sec Interlobar:  19 cm/sec Arcuate:  16 cm/sec Aortic Velocity: Unable to visualize aorta due to patient body habitus and shadowing bowel gas. Right Renal-Aortic Ratios: Unable to obtain as the aorta could not be visualized. Left Renal-Aortic Ratios: Unable to obtained as the aorta could not be visualized. IMPRESSION: 1. Renal artery velocities within normal limits. 2. Nonvisualization of the abdominal aorta, therefore renal artery-aorta ratios could not be measured. 3. Partially visualized cystic mass noted in the right abdomen measuring 10.8 x 5.6 cm is of uncertain etiology. Further evaluation with CT of the abdomen and pelvis should be performed to better evaluate this structure. These results will be called to the ordering clinician or representative by the Radiologist Assistant, and communication documented in the PACS or [employer]. Electronically Signed   By: Miachel Roux M.D.   On: 12/22/2020 12:52        Scheduled Meds: . amLODipine  10 mg Oral Daily  . aspirin  81 mg Oral Daily  . atorvastatin  20 mg Oral QHS  . cloNIDine  0.3 mg Oral BID  . clopidogrel  75 mg Oral Daily  . enalapril  5 mg Oral Daily  . gabapentin  200 mg Oral BID  . heparin  5,000 Units Subcutaneous Q8H  . hydrALAZINE  100 mg Oral TID  . insulin aspart  0-15 Units Subcutaneous TID WC  . insulin glargine  10 Units Subcutaneous QHS  . isosorbide mononitrate  30 mg Oral Daily  . levETIRAcetam  500 mg Oral BID  . metoprolol tartrate  100 mg Oral BID  . nystatin  1 application Topical BID  . pantoprazole  40 mg Oral Daily  . polyethylene glycol  17 g Oral Daily  . senna-docusate  2 tablet Oral BID  . vitamin B-12  250 mcg Oral Daily   Continuous Infusions:    LOS: 6 days    Time spent: 25 minutes    Max Sane, MD [facility] Pager [phone]  If 7PM-7AM,  please contact night-coverage 12/22/2020, 1:49 PM

## 2020-12-23 LAB — CBC WITH DIFFERENTIAL/PLATELET
Abs Immature Granulocytes: 0.16 10*3/uL — ABNORMAL HIGH (ref 0.00–0.07)
Basophils Absolute: 0 10*3/uL (ref 0.0–0.1)
Basophils Relative: 0 %
Eosinophils Absolute: 0.1 10*3/uL (ref 0.0–0.5)
Eosinophils Relative: 0 %
HCT: 23.6 % — ABNORMAL LOW (ref 36.0–46.0)
Hemoglobin: 7.7 g/dL — ABNORMAL LOW (ref 12.0–15.0)
Immature Granulocytes: 1 %
Lymphocytes Relative: 7 %
Lymphs Abs: 1.2 10*3/uL (ref 0.7–4.0)
MCH: 28.8 pg (ref 26.0–34.0)
MCHC: 32.6 g/dL (ref 30.0–36.0)
MCV: 88.4 fL (ref 80.0–100.0)
Monocytes Absolute: 0.9 10*3/uL (ref 0.1–1.0)
Monocytes Relative: 5 %
Neutro Abs: 14.5 10*3/uL — ABNORMAL HIGH (ref 1.7–7.7)
Neutrophils Relative %: 87 %
Platelets: 272 10*3/uL (ref 150–400)
RBC: 2.67 MIL/uL — ABNORMAL LOW (ref 3.87–5.11)
RDW: 14.4 % (ref 11.5–15.5)
WBC: 16.7 10*3/uL — ABNORMAL HIGH (ref 4.0–10.5)
nRBC: 0 % (ref 0.0–0.2)

## 2020-12-23 LAB — BASIC METABOLIC PANEL
Anion gap: 9 (ref 5–15)
BUN: 58 mg/dL — ABNORMAL HIGH (ref 8–23)
CO2: 21 mmol/L — ABNORMAL LOW (ref 22–32)
Calcium: 8.4 mg/dL — ABNORMAL LOW (ref 8.9–10.3)
Chloride: 107 mmol/L (ref 98–111)
Creatinine, Ser: 3.64 mg/dL — ABNORMAL HIGH (ref 0.44–1.00)
GFR, Estimated: 13 mL/min — ABNORMAL LOW (ref >60)
Glucose, Bld: 177 mg/dL — ABNORMAL HIGH (ref 70–99)
Potassium: 4.2 mmol/L (ref 3.5–5.1)
Sodium: 137 mmol/L (ref 135–145)

## 2020-12-23 LAB — GLUCOSE, CAPILLARY
Glucose-Capillary: 173 mg/dL — ABNORMAL HIGH (ref 70–99)
Glucose-Capillary: 221 mg/dL — ABNORMAL HIGH (ref 70–99)
Glucose-Capillary: 190 mg/dL — ABNORMAL HIGH (ref 70–99)
Glucose-Capillary: 235 mg/dL — ABNORMAL HIGH (ref 70–99)

## 2020-12-23 MED ORDER — ENALAPRIL MALEATE 2.5 MG PO TABS
2.5000 mg | ORAL_TABLET | Freq: Every day | ORAL | Status: DC
  Administered 2020-12-24: 2.5 mg via ORAL
  Filled 2020-12-23: qty 1

## 2020-12-23 MED ORDER — ISOSORBIDE MONONITRATE ER 30 MG PO TB24
30.0000 mg | ORAL_TABLET | Freq: Every day | ORAL | Status: DC
  Administered 2020-12-24: 30 mg via ORAL
  Filled 2020-12-23: qty 1

## 2020-12-23 NOTE — Progress Notes (Signed)
PROGRESS NOTE    Susan House  YFV:[id number] DOB: 1955/09/22 DOA: 12/14/2020 PCP: Jerrol Banana., MD  Brief Narrative:   65 y.o. female with medical history significant for history of CVA with residual left-sided weakness, left frontal encephalomalacia, CKD stage IIIb-IV, insulin-dependent type 2 diabetes, hypertension, hyperlipidemia, seizure disorder, and memory loss who presents to the ED for evaluation of generalized weakness and abnormal labs.   Patient recently admitted 11/28/2020-12/03/2020 for AKI on CKD managed with IV fluid hydration.  Renal ultrasound showed increased echogenicity compatible with medical renal disease and a 2.7 cm right kidney cyst.  She was discharged to SNF.   Patient states that she was sent to the ED due to abnormal labs.  Found to have AKI on CKD and evidence of urinary tract infection.  Kidney function appears to have peaked at 3.44.  Now slightly downtrending.  Mental status also slowly improving.  Mental status has been waxing and waning.  Discussed this with the patient's daughter.  I feel this is a progression of underlying vascular dementia and patient if may be established a new psychiatric baseline.  Unfortunately blood pressure has been very difficult to control.  Intravenous fluids were discontinued on 6/5 and patient had subsequent deterioration kidney function 6/6.  Nephrology consult requested for recommendations.      Assessment & Plan:   Principal Problem:   Acute kidney injury superimposed on chronic kidney disease ([place]) Active Problems:   Hypertension associated with diabetes ([place])   Type 2 diabetes mellitus with complications ([facility])   Hemiparesis due to old cerebrovascular accident ([place])   Hyperlipidemia associated with type 2 diabetes mellitus ([place])   Hx of completed stroke   AKI (acute kidney injury) ([place])   AKI on CKD stage IIIb Recently admitted for the same.  Renal function progressively worsening.  Similar to  previous presentation likely etiology is ATN in the setting of poorly controlled hypertension as well as possible relative dehydration/weight depletion Creat: 3.45->3.64 Nephrology following. Avoid NSAIDs and nonessential nephrotoxins Renal ultrasound with Doppler rule showed 10.8 x 5.6 cm cystic mass on the right side.  CT abdomen pelvis did not show this mass.  Showed some colitis but she is not having any symptoms of colitis so we will hold off treatment.  Also diverticulosis but no diverticulitis.  Refractory hypertension Poor control over interval Likely contributing to some of the deterioration in kidney function Blood pressure low today Continue amlodipine, clonidine, hydralazine at current doses Continue metoprolol titrate 100 twice daily -Reduce her dose of Imdur to 30 mg on 6/9 as blood pressure is lower today Continue Vasotec at lower dose of 2.5 mg instead of 5 mg daily as creatinine going up  Urinary tract infection Completed 3 days of Rocephin.  Anemia of chronic kidney disease and B12 deficiency Hemoglobin 7.8 on admission.  Patient denies any obvious bleeding.  Continue B12 supplement and monitor for any signs/symptoms of bleeding.   Insulin-dependent type 2 diabetes A1c 8.6%.  Reduce Lantus to 10 units nightly and continue SSI.   History of CVA with residual left-sided weakness/left frontal encephalomalacia Continue aspirin, Plavix, atorvastatin.   Seizure disorder Continue Keppra Seizure precaution  Memory loss Suspected underlying vascular dementia.   She had some reported confusion/delirium prior to arrival.   Baseline level of mentation is unclear CT head significant for chronic volume loss and small vessel ischemic changes consistent with suspected vascular dementia Outpatient follow-up  Constipation Continue Senokot-S and MiraLAX  DVT prophylaxis: SQ heparin Code  Status: DNR Family Communication:  Daughter Candace updated over phone [phone] on  6/9 Disposition Plan: Status is: Inpatient  Remains inpatient appropriate because:Inpatient level of care appropriate due to severity of illness   Dispo: The patient is from: SNF              Anticipated d/c is to: SNF              Patient currently is not medically stable to d/c.   Difficult to place patient No   AKI on CKD.  Kidney function remains poor and and still slowly worsening.  Work-up in progress.  Blood pressure control remains a challenge.   Level of care: Med-Surg  Consultants:  Nephrology  Procedures:  None  Antimicrobials:  Ceftriaxone completed   Subjective: Not feeling too well.  Blood pressure low today.  Tired of being here  Objective: Vitals:   12/22/20 2329 12/23/20 0334 12/23/20 0759 12/23/20 1136  BP: (!) 113/50 (!) 128/52 (!) 134/47 (!) 99/41  Pulse: 60 (!) 59 (!) 57 (!) 54  Resp: 17 16 16 16   Temp: (!) 97.5 F (36.4 C) 97.6 F (36.4 C) (!) 97.4 F (36.3 C) (!) 97.3 F (36.3 C)  TempSrc: Oral Oral Oral   SpO2: 98% 97% 98% 100%  Weight:        Intake/Output Summary (Last 24 hours) at 12/23/2020 1220 Last data filed at 12/23/2020 1011 Gross per 24 hour  Intake 540 ml  Output 350 ml  Net 190 ml   Filed Weights   12/17/20 0500 12/20/20 0500 12/21/20 0606  Weight: 77.3 kg 92.3 kg 92.5 kg    Examination:  General exam: Mild distress.  Appears fatigued Respiratory system: Lungs clear.  Normal work of breathing.  Room air Cardiovascular system: S1 & S2 heard, RRR. No JVD, murmurs, rubs, gallops or clicks.  No pitting edema. Gastrointestinal system: Abdomen is nondistended, soft and nontender. No organomegaly or masses felt. Normal bowel sounds heard. Central nervous system: Alert and oriented x2.  No focal deficits Extremities: Symmetric 5 x 5 power. Skin: No rashes, lesions or ulcers Psychiatry: Judgement and insight appear impaired. Mood & affect flattened.     Data Reviewed: I have personally reviewed following labs and imaging  studies  CBC: Recent Labs  Lab 12/19/20 0426 12/20/20 0453 12/21/20 0503 12/22/20 0420 12/23/20 0358  WBC 6.8 6.9 6.8 8.7 16.7*  NEUTROABS 3.7 4.1 3.4 5.5 14.5*  HGB 7.2* 7.5* 7.6* 7.7* 7.7*  HCT 21.5* 22.6* 23.5* 23.8* 23.6*  MCV 86.0 86.3 87.7 87.2 88.4  PLT 272 278 277 280 160   Basic Metabolic Panel: Recent Labs  Lab 12/19/20 0426 12/20/20 0453 12/21/20 0503 12/22/20 0420 12/23/20 0358  NA 140 138 136 138 137  K 3.6 3.9 3.7 4.0 4.2  CL 111 110 108 106 107  CO2 21* 21* 21* 21* 21*  GLUCOSE 192* 216* 112* 151* 177*  BUN 48* 49* 48* 55* 58*  CREATININE 2.92* 3.19* 3.06* 3.45* 3.64*  CALCIUM 8.3* 8.3* 8.5* 8.6* 8.4*   GFR: Estimated Creatinine Clearance: 16.5 mL/min (A) (by C-G formula based on SCr of 3.64 mg/dL (H)). Liver Function Tests: No results for input(s): AST, ALT, ALKPHOS, BILITOT, PROT, ALBUMIN in the last 168 hours. No results for input(s): LIPASE, AMYLASE in the last 168 hours. No results for input(s): AMMONIA in the last 168 hours. Coagulation Profile: No results for input(s): INR, PROTIME in the last 168 hours. Cardiac Enzymes: No results for input(s): CKTOTAL, CKMB, CKMBINDEX,  TROPONINI in the last 168 hours. BNP (last 3 results) No results for input(s): PROBNP in the last 8760 hours. HbA1C: No results for input(s): HGBA1C in the last 72 hours. CBG: Recent Labs  Lab 12/22/20 0752 12/22/20 1137 12/22/20 2050 12/23/20 0723 12/23/20 1132  GLUCAP 163* 180* 243* 173* 221*   Lipid Profile: No results for input(s): CHOL, HDL, LDLCALC, TRIG, CHOLHDL, LDLDIRECT in the last 72 hours. Thyroid Function Tests: No results for input(s): TSH, T4TOTAL, FREET4, T3FREE, THYROIDAB in the last 72 hours. Anemia Panel: No results for input(s): VITAMINB12, FOLATE, FERRITIN, TIBC, IRON, RETICCTPCT in the last 72 hours. Sepsis Labs: No results for input(s): PROCALCITON, LATICACIDVEN in the last 168 hours.  Recent Results (from the past 240 hour(s))  SARS  CORONAVIRUS 2 (TAT 6-24 HRS) Nasopharyngeal Nasopharyngeal Swab     Status: None   Collection Time: 12/14/20  8:28 PM   Specimen: Nasopharyngeal Swab  Result Value Ref Range Status   SARS Coronavirus 2 NEGATIVE NEGATIVE Final    Comment: (NOTE) SARS-CoV-2 target nucleic acids are NOT DETECTED.  The SARS-CoV-2 RNA is generally detectable in upper and lower respiratory specimens during the acute phase of infection. Negative results do not preclude SARS-CoV-2 infection, do not rule out co-infections with other pathogens, and should not be used as the sole basis for treatment or other patient management decisions. Negative results must be combined with clinical observations, patient history, and epidemiological information. The expected result is Negative.  Fact Sheet for Patients: SugarRoll.be  Fact Sheet for Healthcare Providers: https://www.woods-mathews.com/  This test is not yet approved or cleared by the [place] FDA and  has been authorized for detection and/or diagnosis of SARS-CoV-2 by FDA under an Emergency Use Authorization (EUA). This EUA will remain  in effect (meaning this test can be used) for the duration of the COVID-19 declaration under Se ction 564(b)(1) of the Act, 21 U.S.C. section 360bbb-3(b)(1), unless the authorization is terminated or revoked sooner.  Performed at [facility], [place] [address] [place], [place] [address]   SARS CORONAVIRUS 2 (TAT 6-24 HRS) Nasopharyngeal Nasopharyngeal Swab     Status: None   Collection Time: 12/19/20  4:40 PM   Specimen: Nasopharyngeal Swab  Result Value Ref Range Status   SARS Coronavirus 2 NEGATIVE NEGATIVE Final    Comment: (NOTE) SARS-CoV-2 target nucleic acids are NOT DETECTED.  The SARS-CoV-2 RNA is generally detectable in upper and lower respiratory specimens during the acute phase of infection. Negative results do not preclude SARS-CoV-2 infection, do not rule  out co-infections with other pathogens, and should not be used as the sole basis for treatment or other patient management decisions. Negative results must be combined with clinical observations, patient history, and epidemiological information. The expected result is Negative.  Fact Sheet for Patients: SugarRoll.be  Fact Sheet for Healthcare Providers: https://www.woods-mathews.com/  This test is not yet approved or cleared by the [place] FDA and  has been authorized for detection and/or diagnosis of SARS-CoV-2 by FDA under an Emergency Use Authorization (EUA). This EUA will remain  in effect (meaning this test can be used) for the duration of the COVID-19 declaration under Se ction 564(b)(1) of the Act, 21 U.S.C. section 360bbb-3(b)(1), unless the authorization is terminated or revoked sooner.  Performed at [facility], [place] [address] [place], [place] [address]          Radiology Studies: CT ABDOMEN PELVIS WO CONTRAST  Result Date: 12/22/2020 CLINICAL DATA:  Urologic cancer, surveillance  cycstic mass Cystic mass seen on renal ultrasound. EXAM: CT ABDOMEN AND PELVIS WITHOUT CONTRAST TECHNIQUE: Multidetector CT imaging of the abdomen and pelvis was performed following the standard protocol without IV contrast. COMPARISON:  Renal ultrasound earlier today. Abdominopelvic CT 10/15/2016 FINDINGS: Lower chest: The heart is enlarged. Dense mitral annulus calcifications. There are coronary artery calcifications. Small right pleural effusion. No confluent basilar consolidation. Hepatobiliary: No evidence of focal liver abnormality on this noncontrast exam. Cholecystectomy without biliary dilatation. Pancreas: Fatty atrophy about the head and uncinate process. No ductal dilatation or inflammation. Spleen: Normal in size without focal abnormality. Adrenals/Urinary Tract: Mild adrenal thickening without dominant nodule. Mild bilateral renal  parenchymal thinning with cortical scarring in the mid left kidney. No hydronephrosis. Slight perinephric edema. Renal cyst cyst seen on ultrasound. There is no evidence of suspicious renal lesion. No renal calculi. Decompressed ureters. Bladder wall thickening with minimal layering debris. Mild perivesicular fat stranding. Stomach/Bowel: The stomach is unremarkable. Administered enteric contrast reaches the mid small bowel. There is no small bowel obstruction or small bowel inflammation. Appendix is not visualized, no appendicitis. Fluid-filled hepatic flexure, transverse, and distal colon. There is associated colonic wall thickening at the splenic flexure, descending, and sigmoid colon with mild pericolonic edema. Sigmoid colonic diverticulosis without focal diverticulitis. Circumferential rectal wall thickening with moderate perirectal edema. There is no bowel pneumatosis or perforation. Vascular/Lymphatic: Advanced aortic and branch atherosclerosis. No aortic aneurysm. Small retroperitoneal lymph nodes, not enlarged by size criteria. Reproductive: Uterus is surgically absent.  No adnexal mass. Other: There is no intra-abdominopelvic mass or cyst corresponding to that seen on recent ultrasound. No ascites or free fluid. No free air. No focal fluid collection. There is a small fat containing umbilical hernia. Scattered subcutaneous edema of the subcutaneous tissues. Postsurgical change of the lower abdominal wall. Musculoskeletal: L4-L5 posterior lumbar fusion. Bones diffusely under mineralized. Degenerative change in the lumbar spine. Prominent Schmorl's node in the superior endplate of L1. IMPRESSION: 1. No intra-abdominopelvic cyst or cystic mass to correlate with recent ultrasound. 2. Colonic wall thickening with pericolonic edema at the splenic flexure, descending, and sigmoid colon, consistent with colitis. Sigmoid colonic diverticulosis without focal diverticulitis. 3. Prominent rectal wall thickening and  perirectal edema. 4. Bladder wall thickening with minimal layering debris, can be seen with urinary tract renal cysts, as seen on recent ultrasound. 5. Small right pleural effusion. 6. Advanced aortic and branch atherosclerosis. Aortic Atherosclerosis (ICD10-I70.0). Electronically Signed   By: Keith Rake M.D.   On: 12/22/2020 18:59   US RENAL ARTERY DUPLEX COMPLETE  Result Date: 12/22/2020 CLINICAL DATA:  Renal vascular hypertension EXAM: RENAL/URINARY TRACT ULTRASOUND RENAL DUPLEX DOPPLER ULTRASOUND COMPARISON:  Ultrasound kidneys 12/16/2020 FINDINGS: Right Kidney: Length: 10.2 cm. Mild increased cortical echogenicity without significant thinning. No mass or hydronephrosis. Two simple right renal cysts with the larger measuring 2.4 cm. Left Kidney: Length: 11.0 cm. Mild increased cortical echogenicity without significant thinning. No mass or hydronephrosis. Septated cyst noted in the mid left kidney measuring 1.9 cm. Bladder:  Not visualized. RENAL DUPLEX ULTRASOUND Right Renal Artery Velocities: Origin:  86 cm/sec Mid:  66 cm/sec Hil[place]:  28 cm/sec Interlobar:  10 cm/sec Arcuate:  11 cm/sec Left Renal Artery Velocities: Origin:  57 cm/sec Mid:  69 cm/sec Hilum:  24 cm/sec Interlobar:  19 cm/sec Arcuate:  16 cm/sec Aortic Velocity: Unable to visualize aorta due to patient body habitus and shadowing bowel gas. Right Renal-Aortic Ratios: Unable to obtain as the aorta could not be visualized. Left Renal-Aortic Ratios:  Unable to obtained as the aorta could not be visualized. IMPRESSION: 1. Renal artery velocities within normal limits. 2. Nonvisualization of the abdominal aorta, therefore renal artery-aorta ratios could not be measured. 3. Partially visualized cystic mass noted in the right abdomen measuring 10.8 x 5.6 cm is of uncertain etiology. Further evaluation with CT of the abdomen and pelvis should be performed to better evaluate this structure. These results will be called to the ordering clinician or  representative by the Radiologist Assistant, and communication documented in the PACS or [employer]. Electronically Signed   By: Miachel Roux M.D.   On: 12/22/2020 12:52         Scheduled Meds:  amLODipine  10 mg Oral Daily   aspirin  81 mg Oral Daily   atorvastatin  20 mg Oral QHS   cloNIDine  0.3 mg Oral BID   clopidogrel  75 mg Oral Daily   [START ON 12/24/2020] enalapril  2.5 mg Oral Daily   gabapentin  200 mg Oral BID   heparin  5,000 Units Subcutaneous Q8H   hydrALAZINE  100 mg Oral TID   insulin aspart  0-15 Units Subcutaneous TID WC   insulin glargine  10 Units Subcutaneous QHS   isosorbide mononitrate  60 mg Oral Daily   levETIRAcetam  500 mg Oral BID   metoprolol tartrate  100 mg Oral BID   nystatin  1 application Topical BID   pantoprazole  40 mg Oral Daily   polyethylene glycol  17 g Oral Daily   senna-docusate  2 tablet Oral BID   vitamin B-12  250 mcg Oral Daily   Continuous Infusions:    LOS: 7 days    Time spent: 25 minutes    Max Sane, MD [facility] Pager [phone]  If 7PM-7AM, please contact night-coverage 12/23/2020, 12:20 PM

## 2020-12-23 NOTE — Progress Notes (Signed)
Per physician hold hydralazine due to patient low BP.

## 2020-12-23 NOTE — Progress Notes (Signed)
PT Cancellation Note  Patient Details Name: Susan House MRN: [id number] DOB: August 25, 1955   Cancelled Treatment:     PT attempt. PT hold. Resting BP 101/39 ( MAP 57). Pt is lethargic but conversational and oriented. " I'm not feeling real great right now." PT will continue to follow and progress as able per POC. Will return at later time/date.   Willette Pa 12/23/2020, 3:23 PM

## 2020-12-23 NOTE — Progress Notes (Signed)
   12/23/20 1300  Clinical Encounter Type  Visited With Patient and family together  Visit Type Follow-up;Spiritual support;Social support  Referral From Chaplain  Consult/Referral To [employer] did a follow visit. Chaplain offered emotional and spiritual support. Chaplain ministered with presence. Chaplain spoke briefly with the PT's husband and will follow up at a later time.

## 2020-12-23 NOTE — Progress Notes (Signed)
[facility]  ROUNDING NOTE   Subjective:   Susan House is a 65 y.o. female with medical conditions including CVA with left sided weakness, Type 2 diabetes, hyperlipidemia, seizures, hypertension and CKD 3b. Patient presents ED with weakness and abnormal labs.   Patient seen sitting up in bed eating breakfast Alert and oriented Tolerating meals Denies abdominal pain    Objective:  Vital signs in last 24 hours:  Temp:  [97.4 F (36.3 C)-98 F (36.7 C)] 97.4 F (36.3 C) (06/09 0759) Pulse Rate:  [56-64] 57 (06/09 0759) Resp:  [16-17] 16 (06/09 0759) BP: (113-134)/(47-56) 134/47 (06/09 0759) SpO2:  [97 %-99 %] 98 % (06/09 0759)  Weight change:  Filed Weights   12/17/20 0500 12/20/20 0500 12/21/20 0606  Weight: 77.3 kg 92.3 kg 92.5 kg    Intake/Output: I/O last 3 completed shifts: In: 180 [P.O.:180] Out: 350 [Urine:350]   Intake/Output this shift:  Total I/O In: 360 [P.O.:360] Out: -   Physical Exam: General: NAD, laying in bed  Head: Normocephalic, atraumatic. Moist oral mucosal membranes  Eyes: Anicteric  Lungs:  Clear ,normal breathing effort  Heart: Regular rate and rhythm  Abdomen:  Soft, nontender   Extremities:  no peripheral edema.  Neurologic: Alert moving all four extremities  Skin: No lesions       Basic Metabolic Panel: Recent Labs  Lab 12/19/20 0426 12/20/20 0453 12/21/20 0503 12/22/20 0420 12/23/20 0358  NA 140 138 136 138 137  K 3.6 3.9 3.7 4.0 4.2  CL 111 110 108 106 107  CO2 21* 21* 21* 21* 21*  GLUCOSE 192* 216* 112* 151* 177*  BUN 48* 49* 48* 55* 58*  CREATININE 2.92* 3.19* 3.06* 3.45* 3.64*  CALCIUM 8.3* 8.3* 8.5* 8.6* 8.4*     Liver Function Tests: No results for input(s): AST, ALT, ALKPHOS, BILITOT, PROT, ALBUMIN in the last 168 hours. No results for input(s): LIPASE, AMYLASE in the last 168 hours. No results for input(s): AMMONIA in the last 168 hours.  CBC: Recent Labs  Lab 12/19/20 0426  12/20/20 0453 12/21/20 0503 12/22/20 0420 12/23/20 0358  WBC 6.8 6.9 6.8 8.7 16.7*  NEUTROABS 3.7 4.1 3.4 5.5 14.5*  HGB 7.2* 7.5* 7.6* 7.7* 7.7*  HCT 21.5* 22.6* 23.5* 23.8* 23.6*  MCV 86.0 86.3 87.7 87.2 88.4  PLT 272 278 277 280 272     Cardiac Enzymes: No results for input(s): CKTOTAL, CKMB, CKMBINDEX, TROPONINI in the last 168 hours.  BNP: Invalid input(s): POCBNP  CBG: Recent Labs  Lab 12/21/20 2112 12/22/20 0752 12/22/20 1137 12/22/20 2050 12/23/20 0723  GLUCAP 169* 163* 180* 243* 173*     Microbiology: Results for orders placed or performed during the hospital encounter of 12/14/20  SARS CORONAVIRUS 2 (TAT 6-24 HRS) Nasopharyngeal Nasopharyngeal Swab     Status: None   Collection Time: 12/14/20  8:28 PM   Specimen: Nasopharyngeal Swab  Result Value Ref Range Status   SARS Coronavirus 2 NEGATIVE NEGATIVE Final    Comment: (NOTE) SARS-CoV-2 target nucleic acids are NOT DETECTED.  The SARS-CoV-2 RNA is generally detectable in upper and lower respiratory specimens during the acute phase of infection. Negative results do not preclude SARS-CoV-2 infection, do not rule out co-infections with other pathogens, and should not be used as the sole basis for treatment or other patient management decisions. Negative results must be combined with clinical observations, patient history, and epidemiological information. The expected result is Negative.  Fact Sheet for Patients: SugarRoll.be  Fact  Sheet for Healthcare Providers: https://www.woods-mathews.com/  This test is not yet approved or cleared by the [place] FDA and  has been authorized for detection and/or diagnosis of SARS-CoV-2 by FDA under an Emergency Use Authorization (EUA). This EUA will remain  in effect (meaning this test can be used) for the duration of the COVID-19 declaration under Se ction 564(b)(1) of the Act, 21 U.S.C. section 360bbb-3(b)(1),  unless the authorization is terminated or revoked sooner.  Performed at [facility], [place] [address], [place], [place] [address]   SARS CORONAVIRUS 2 (TAT 6-24 HRS) Nasopharyngeal Nasopharyngeal Swab     Status: None   Collection Time: 12/19/20  4:40 PM   Specimen: Nasopharyngeal Swab  Result Value Ref Range Status   SARS Coronavirus 2 NEGATIVE NEGATIVE Final    Comment: (NOTE) SARS-CoV-2 target nucleic acids are NOT DETECTED.  The SARS-CoV-2 RNA is generally detectable in upper and lower respiratory specimens during the acute phase of infection. Negative results do not preclude SARS-CoV-2 infection, do not rule out co-infections with other pathogens, and should not be used as the sole basis for treatment or other patient management decisions. Negative results must be combined with clinical observations, patient history, and epidemiological information. The expected result is Negative.  Fact Sheet for Patients: SugarRoll.be  Fact Sheet for Healthcare Providers: https://www.woods-mathews.com/  This test is not yet approved or cleared by the [place] FDA and  has been authorized for detection and/or diagnosis of SARS-CoV-2 by FDA under an Emergency Use Authorization (EUA). This EUA will remain  in effect (meaning this test can be used) for the duration of the COVID-19 declaration under Se ction 564(b)(1) of the Act, 21 U.S.C. section 360bbb-3(b)(1), unless the authorization is terminated or revoked sooner.  Performed at [facility], [place] [address], [place], [place] [address]     Coagulation Studies: No results for input(s): LABPROT, INR in the last 72 hours.  Urinalysis: No results for input(s): COLORURINE, LABSPEC, PHURINE, GLUCOSEU, HGBUR, BILIRUBINUR, KETONESUR, PROTEINUR, UROBILINOGEN, NITRITE, LEUKOCYTESUR in the last 72 hours.  Invalid input(s): APPERANCEUR    Imaging: CT ABDOMEN PELVIS WO  CONTRAST  Result Date: 12/22/2020 CLINICAL DATA:  Urologic cancer, surveillance cycstic mass Cystic mass seen on renal ultrasound. EXAM: CT ABDOMEN AND PELVIS WITHOUT CONTRAST TECHNIQUE: Multidetector CT imaging of the abdomen and pelvis was performed following the standard protocol without IV contrast. COMPARISON:  Renal ultrasound earlier today. Abdominopelvic CT 10/15/2016 FINDINGS: Lower chest: The heart is enlarged. Dense mitral annulus calcifications. There are coronary artery calcifications. Small right pleural effusion. No confluent basilar consolidation. Hepatobiliary: No evidence of focal liver abnormality on this noncontrast exam. Cholecystectomy without biliary dilatation. Pancreas: Fatty atrophy about the head and uncinate process. No ductal dilatation or inflammation. Spleen: Normal in size without focal abnormality. Adrenals/Urinary Tract: Mild adrenal thickening without dominant nodule. Mild bilateral renal parenchymal thinning with cortical scarring in the mid left kidney. No hydronephrosis. Slight perinephric edema. Renal cyst cyst seen on ultrasound. There is no evidence of suspicious renal lesion. No renal calculi. Decompressed ureters. Bladder wall thickening with minimal layering debris. Mild perivesicular fat stranding. Stomach/Bowel: The stomach is unremarkable. Administered enteric contrast reaches the mid small bowel. There is no small bowel obstruction or small bowel inflammation. Appendix is not visualized, no appendicitis. Fluid-filled hepatic flexure, transverse, and distal colon. There is associated colonic wall thickening at the splenic flexure, descending, and sigmoid colon with mild pericolonic edema. Sigmoid colonic diverticulosis without focal diverticulitis. Circumferential rectal wall thickening  with moderate perirectal edema. There is no bowel pneumatosis or perforation. Vascular/Lymphatic: Advanced aortic and branch atherosclerosis. No aortic aneurysm. Small retroperitoneal  lymph nodes, not enlarged by size criteria. Reproductive: Uterus is surgically absent.  No adnexal mass. Other: There is no intra-abdominopelvic mass or cyst corresponding to that seen on recent ultrasound. No ascites or free fluid. No free air. No focal fluid collection. There is a small fat containing umbilical hernia. Scattered subcutaneous edema of the subcutaneous tissues. Postsurgical change of the lower abdominal wall. Musculoskeletal: L4-L5 posterior lumbar fusion. Bones diffusely under mineralized. Degenerative change in the lumbar spine. Prominent Schmorl's node in the superior endplate of L1. IMPRESSION: 1. No intra-abdominopelvic cyst or cystic mass to correlate with recent ultrasound. 2. Colonic wall thickening with pericolonic edema at the splenic flexure, descending, and sigmoid colon, consistent with colitis. Sigmoid colonic diverticulosis without focal diverticulitis. 3. Prominent rectal wall thickening and perirectal edema. 4. Bladder wall thickening with minimal layering debris, can be seen with urinary tract renal cysts, as seen on recent ultrasound. 5. Small right pleural effusion. 6. Advanced aortic and branch atherosclerosis. Aortic Atherosclerosis (ICD10-I70.0). Electronically Signed   By: Keith Rake M.D.   On: 12/22/2020 18:59   US RENAL ARTERY DUPLEX COMPLETE  Result Date: 12/22/2020 CLINICAL DATA:  Renal vascular hypertension EXAM: RENAL/URINARY TRACT ULTRASOUND RENAL DUPLEX DOPPLER ULTRASOUND COMPARISON:  Ultrasound kidneys 12/16/2020 FINDINGS: Right Kidney: Length: 10.2 cm. Mild increased cortical echogenicity without significant thinning. No mass or hydronephrosis. Two simple right renal cysts with the larger measuring 2.4 cm. Left Kidney: Length: 11.0 cm. Mild increased cortical echogenicity without significant thinning. No mass or hydronephrosis. Septated cyst noted in the mid left kidney measuring 1.9 cm. Bladder:  Not visualized. RENAL DUPLEX ULTRASOUND Right Renal Artery  Velocities: Origin:  86 cm/sec Mid:  66 cm/sec Hilum:  28 cm/sec Interlobar:  10 cm/sec Arcuate:  11 cm/sec Left Renal Artery Velocities: Origin:  57 cm/sec Mid:  69 cm/sec Hilum:  24 cm/sec Interlobar:  19 cm/sec Arcuate:  16 cm/sec Aortic Velocity: Unable to visualize aorta due to patient body habitus and shadowing bowel gas. Right Renal-Aortic Ratios: Unable to obtain as the aorta could not be visualized. Left Renal-Aortic Ratios: Unable to obtained as the aorta could not be visualized. IMPRESSION: 1. Renal artery velocities within normal limits. 2. Nonvisualization of the abdominal aorta, therefore renal artery-aorta ratios could not be measured. 3. Partially visualized cystic mass noted in the right abdomen measuring 10.8 x 5.6 cm is of uncertain etiology. Further evaluation with CT of the abdomen and pelvis should be performed to better evaluate this structure. These results will be called to the ordering clinician or representative by the Radiologist Assistant, and communication documented in the PACS or [employer]. Electronically Signed   By: Miachel Roux M.D.   On: 12/22/2020 12:52     Medications:     amLODipine  10 mg Oral Daily   aspirin  81 mg Oral Daily   atorvastatin  20 mg Oral QHS   cloNIDine  0.3 mg Oral BID   clopidogrel  75 mg Oral Daily   [START ON 12/24/2020] enalapril  2.5 mg Oral Daily   gabapentin  200 mg Oral BID   heparin  5,000 Units Subcutaneous Q8H   hydrALAZINE  100 mg Oral TID   insulin aspart  0-15 Units Subcutaneous TID WC   insulin glargine  10 Units Subcutaneous QHS   isosorbide mononitrate  60 mg Oral Daily   levETIRAcetam  500 mg  Oral BID   metoprolol tartrate  100 mg Oral BID   nystatin  1 application Topical BID   pantoprazole  40 mg Oral Daily   polyethylene glycol  17 g Oral Daily   senna-docusate  2 tablet Oral BID   vitamin B-12  250 mcg Oral Daily   acetaminophen **OR** acetaminophen, HYDROcodone-acetaminophen, hydrocortisone, iohexol,  ondansetron **OR** ondansetron (ZOFRAN) IV, senna-docusate  Assessment/ Plan:  [name] is a 65 y.o.  female who was admitted to [facility] on 11/28/2020 for AKI on CKD. Patient has previous medical conditions including diabetes, hypertension, CVA with left hemiplegia residual, morbid obesity and chronic kidney disease stage 3B. She presents to the ED with weakness and abnormal labs.    Acute Kidney Injury on chronic kidney disease stage 3B with baseline creatinine 2.32 and GFR of 23 on 12/03/20.  Acute kidney injury secondary to ATN due to poor intake vs uncontrolled hypertension Chronic kidney disease is secondary to uncontrolled diabetes No acute need for dialysis No IV contrast exposure Renal US shows simple renal cyst, but no obstruction Creatinine has increased with Enalapril Currently 3.64 Will monitor   Lab Results  Component Value Date   CREATININE 3.64 (H) 12/23/2020   CREATININE 3.45 (H) 12/22/2020   CREATININE 3.06 (H) 12/21/2020    Intake/Output Summary (Last 24 hours) at 12/23/2020 1110 Last data filed at 12/23/2020 1011 Gross per 24 hour  Intake 540 ml  Output 350 ml  Net 190 ml     2. Hypertension:  Home regimen includes Amlodipine, Clonidine, Enalapril, Hydralazine, and Metoprolol.  Will reduce Enalapril to 2.5mg  due to creatinine increase Renal doppler completed this morning to evlauate renovascular hypertension. Findings consist of cystic mass in the right abdomen. Follow up CT negative for significant concerns  3. Diabetes mellitus type II with chronic kidney disease Insulin dependent with Metformin and Lantus prescribed for home Hgb A1c 8.6 on 11/29/20 SSi ordered for management     LOS: 7 [place] 6/9/202211:10 AM

## 2020-12-24 DIAGNOSIS — I69359 Hemiplegia and hemiparesis following cerebral infarction affecting unspecified side: Secondary | ICD-10-CM | POA: Diagnosis not present

## 2020-12-24 DIAGNOSIS — N179 Acute kidney failure, unspecified: Secondary | ICD-10-CM | POA: Diagnosis not present

## 2020-12-24 DIAGNOSIS — N189 Chronic kidney disease, unspecified: Secondary | ICD-10-CM | POA: Diagnosis not present

## 2020-12-24 DIAGNOSIS — Z8673 Personal history of transient ischemic attack (TIA), and cerebral infarction without residual deficits: Secondary | ICD-10-CM | POA: Diagnosis not present

## 2020-12-24 LAB — CBC WITH DIFFERENTIAL/PLATELET
Abs Immature Granulocytes: 0.09 10*3/uL — ABNORMAL HIGH (ref 0.00–0.07)
Basophils Absolute: 0 10*3/uL (ref 0.0–0.1)
Basophils Relative: 0 %
Eosinophils Absolute: 0.3 10*3/uL (ref 0.0–0.5)
Eosinophils Relative: 4 %
HCT: 21.8 % — ABNORMAL LOW (ref 36.0–46.0)
Hemoglobin: 7.2 g/dL — ABNORMAL LOW (ref 12.0–15.0)
Immature Granulocytes: 1 %
Lymphocytes Relative: 18 %
Lymphs Abs: 1.4 10*3/uL (ref 0.7–4.0)
MCH: 28.7 pg (ref 26.0–34.0)
MCHC: 33 g/dL (ref 30.0–36.0)
MCV: 86.9 fL (ref 80.0–100.0)
Monocytes Absolute: 0.7 10*3/uL (ref 0.1–1.0)
Monocytes Relative: 8 %
Neutro Abs: 5.5 10*3/uL (ref 1.7–7.7)
Neutrophils Relative %: 69 %
Platelets: 232 10*3/uL (ref 150–400)
RBC: 2.51 MIL/uL — ABNORMAL LOW (ref 3.87–5.11)
RDW: 14.6 % (ref 11.5–15.5)
WBC: 8 10*3/uL (ref 4.0–10.5)
nRBC: 0 % (ref 0.0–0.2)

## 2020-12-24 LAB — BASIC METABOLIC PANEL
Anion gap: 11 (ref 5–15)
BUN: 64 mg/dL — ABNORMAL HIGH (ref 8–23)
CO2: 20 mmol/L — ABNORMAL LOW (ref 22–32)
Calcium: 8.1 mg/dL — ABNORMAL LOW (ref 8.9–10.3)
Chloride: 103 mmol/L (ref 98–111)
Creatinine, Ser: 4.07 mg/dL — ABNORMAL HIGH (ref 0.44–1.00)
GFR, Estimated: 12 mL/min — ABNORMAL LOW (ref >60)
Glucose, Bld: 197 mg/dL — ABNORMAL HIGH (ref 70–99)
Potassium: 4.3 mmol/L (ref 3.5–5.1)
Sodium: 134 mmol/L — ABNORMAL LOW (ref 135–145)

## 2020-12-24 LAB — GLUCOSE, CAPILLARY
Glucose-Capillary: 181 mg/dL — ABNORMAL HIGH (ref 70–99)
Glucose-Capillary: 191 mg/dL — ABNORMAL HIGH (ref 70–99)
Glucose-Capillary: 200 mg/dL — ABNORMAL HIGH (ref 70–99)
Glucose-Capillary: 203 mg/dL — ABNORMAL HIGH (ref 70–99)

## 2020-12-24 MED ORDER — SODIUM CHLORIDE 0.9 % IV BOLUS
250.0000 mL | Freq: Once | INTRAVENOUS | Status: AC
  Administered 2021-01-10: 250 mL via INTRAVENOUS

## 2020-12-24 MED ORDER — LEVETIRACETAM 500 MG PO TABS
500.0000 mg | ORAL_TABLET | ORAL | Status: DC
  Administered 2020-12-25 – 2021-01-19 (×26): 500 mg via ORAL
  Filled 2020-12-24 (×27): qty 1

## 2020-12-24 NOTE — Progress Notes (Addendum)
Physical Therapy Treatment Patient Details Name: Susan House MRN: [id number] DOB: 1956/02/06 Today's Date: 12/24/2020    History of Present Illness Pt is a 65 y.o. female with medical history significant for history of CVA with residual left-sided weakness, left frontal encephalomalacia, CKD stage IIIb-IV, insulin-dependent type 2 diabetes, hypertension, hyperlipidemia, seizure disorder, and memory loss who presents to the ED for evaluation of generalized weakness and abnormal labs, potential UTI. Patient recently admitted 11/28/2020-12/03/2020 for AKI on CKD, discharged to SNF.    PT Comments    Pt was finishing up breakfast tray upon arriving. She agrees to PT session and is cooperative. Does present with cognition deficits and is poor historian of PLOF. She was able to follow commands with increased time to process. She performed log roll L to short sit with max assist. Stood 3 x with max assist prior to squat pivot to recliner. [name] MD for [facility] boot for LLE/ and resting hand splint for LUE. Will benefit from wearing to prevent contractures. PRAFO boot placed on pt and orthotist contacted for resting hand splint. Pt was sitting in recliner with chair alarm in pace and call bell in reach at conclusion of session. Author will return this afternoon with OT to assist pt with returning to bed.     Follow Up Recommendations  SNF     Equipment Recommendations  None recommended by PT       Precautions / Restrictions Precautions Precautions: Fall Restrictions Weight Bearing Restrictions: No    Mobility  Bed Mobility Overal bed mobility: Needs Assistance Bed Mobility: Supine to Sit;Sit to Supine;Rolling Rolling: Max assist   Supine to sit: Max assist Sit to supine: Max assist   General bed mobility comments: rolled L to short sit EOB    Transfers Overall transfer level: Needs assistance Equipment used: None (squat pivot with gait belt and BLEs knee  blocking) Transfers: Squat Pivot Transfers Sit to Stand: Max assist;From elevated surface         General transfer comment: Pt performed STS EOB 3 x prior to squat pivot to recliner. max assist + vcs. able to static stand with assist for short periods of time.  Ambulation/Gait    General Gait Details: unable/unsafe       Balance Overall balance assessment: Needs assistance Sitting-balance support: Feet supported;No upper extremity supported Sitting balance-Leahy Scale: Fair Sitting balance - Comments: Pt required CGA for safety while sitting EOB with feet support and UE support   Standing balance support: During functional activity Standing balance-Leahy Scale: Poor Standing balance comment: Extremely high fall risk. Poor strength and activity tolerance.      Cognition Arousal/Alertness: Awake/alert Behavior During Therapy: WFL for tasks assessed/performed Overall Cognitive Status: History of cognitive impairments - at baseline      General Comments: pt's cognition varied throughout the session. Oriented to place and name but disoriented about situtation and PLOF         General Comments General comments (skin integrity, edema, etc.): MD messaged about resting hand splint and PRAFO for L side. Recommend only using at night 2/2 to pt does have active movements in both UE/LE      Pertinent Vitals/Pain Pain Assessment: No/denies pain     PT Goals (current goals can now be found in the care plan section) Acute Rehab PT Goals Patient Stated Goal: To get stronger Progress towards PT goals: Progressing toward goals    Frequency    Min 2X/week      PT Plan  Current plan remains appropriate       AM-PAC PT "6 Clicks" Mobility   Outcome Measure  Help needed turning from your back to your side while in a flat bed without using bedrails?: A Lot Help needed moving from lying on your back to sitting on the side of a flat bed without using bedrails?: A Lot Help  needed moving to and from a bed to a chair (including a wheelchair)?: A Lot Help needed standing up from a chair using your arms (e.g., wheelchair or bedside chair)?: A Lot Help needed to walk in hospital room?: Total Help needed climbing 3-5 steps with a railing? : Total 6 Click Score: 10    End of Session Equipment Utilized During Treatment: Gait belt Activity Tolerance: Patient tolerated treatment well Patient left: in chair;with call bell/phone within reach;with chair alarm set Nurse Communication: Mobility status PT Visit Diagnosis: Unsteadiness on feet (R26.81);Difficulty in walking, not elsewhere classified (R26.2);Muscle weakness (generalized) (M62.81)     Time: 2493-2419 PT Time Calculation (min) (ACUTE ONLY): 25 min  Charges:  $Therapeutic Activity: 23-37 mins                    Julaine Fusi PTA 12/24/20, 11:12 AM

## 2020-12-24 NOTE — Progress Notes (Signed)
PROGRESS NOTE    Susan House  YTK:[id number] DOB: [birth year] DOA: 12/14/2020 PCP: Jerrol Banana., MD  Brief Narrative:   65 y.o. female with medical history significant for history of CVA with residual left-sided weakness, left frontal encephalomalacia, CKD stage IIIb-IV, insulin-dependent type 2 diabetes, hypertension, hyperlipidemia, seizure disorder, and memory loss who presents to the ED for evaluation of generalized weakness and abnormal labs.   Patient recently admitted 11/28/2020-12/03/2020 for AKI on CKD managed with IV fluid hydration.  Renal ultrasound showed increased echogenicity compatible with medical renal disease and a 2.7 cm right kidney cyst.  She was discharged to SNF.   Patient states that she was sent to the ED due to abnormal labs.  Found to have AKI on CKD and evidence of urinary tract infection.  Kidney function appears to have peaked at 3.44.  Now slightly downtrending.  Mental status also slowly improving.  Mental status has been waxing and waning.  Discussed this with the patient's daughter.  I feel this is a progression of underlying vascular dementia and patient if may be established a new psychiatric baseline.  Unfortunately blood pressure has been very difficult to control.  Intravenous fluids were discontinued on 6/5 and patient had subsequent deterioration kidney function 6/6.  Nephrology consult requested for recommendations.      Assessment & Plan:   Principal Problem:   Acute kidney injury superimposed on chronic kidney disease ([place]) Active Problems:   Hypertension associated with diabetes ([place])   Type 2 diabetes mellitus with complications ([facility])   Hemiparesis due to old cerebrovascular accident ([place])   Hyperlipidemia associated with type 2 diabetes mellitus ([place])   Hx of completed stroke   AKI (acute kidney injury) ([place])   AKI on CKD stage IIIb Recently admitted for the same.  Renal function progressively worsening.  Similar to  previous presentation likely etiology is ATN in the setting of poorly controlled hypertension as well as possible relative dehydration/weight depletion Creat: 3.45->3.64-> 4.07 Nephrology following. Avoid NSAIDs and nonessential nephrotoxins Renal ultrasound with Doppler rule showed 10.8 x 5.6 cm cystic mass on the right side.  CT abdomen pelvis did not show this mass.  Showed some colitis but she is not having any symptoms of colitis so we will hold off treatment.  Also diverticulosis but no diverticulitis.  Refractory hypertension initially and now hypotensive Stopping amlodipine, metoprolol, Imdur and Vasotec due to low blood pressure Continue clonidine, hydralazine at current doses  Urinary tract infection Completed 3 days of Rocephin.  Anemia of chronic kidney disease and B12 deficiency Hemoglobin 7.2 .  Patient denies any obvious bleeding.  Continue B12 supplement and monitor for any signs/symptoms of bleeding.   Insulin-dependent type 2 diabetes A1c 8.6%.  Reduce Lantus to 10 units nightly and continue SSI.   History of CVA with residual left-sided weakness/left frontal encephalomalacia Continue aspirin, Plavix, atorvastatin.   Seizure disorder Continue Keppra Seizure precaution  Memory loss Suspected underlying vascular dementia.   She had some reported confusion/delirium prior to arrival.   Baseline level of mentation is unclear CT head significant for chronic volume loss and small vessel ischemic changes consistent with suspected vascular dementia Outpatient follow-up  Constipation Continue Senokot-S and MiraLAX  DVT prophylaxis: SQ heparin Code Status: DNR Family Communication:  Daughter Candace updated over phone [phone] on 6/9 Disposition Plan: Status is: Inpatient  Remains inpatient appropriate because:Inpatient level of care appropriate due to severity of illness   Dispo: The patient is from: SNF  Anticipated d/c is to: SNF               Patient currently is not medically stable to d/c.   Difficult to place patient No   AKI on CKD.  Kidney function remains poor and and still slowly worsening.  Work-up in progress.  Blood pressure control remains a challenge.   Level of care: Med-Surg  Consultants:  Nephrology  Procedures:  None  Antimicrobials:  Ceftriaxone completed   Subjective: No new issues.  Blood pressure running low now  Objective: Vitals:   12/23/20 1925 12/24/20 0433 12/24/20 0746 12/24/20 1204  BP: (!) 124/91 (!) 156/62 (!) 155/55 (!) 98/50  Pulse: (!) 56 62 63 (!) 53  Resp: 18 16 14 16   Temp: 97.7 F (36.5 C) 97.7 F (36.5 C) 98.2 F (36.8 C)   TempSrc: Oral Oral Oral   SpO2: 100% 96% 98%   Weight:  90.3 kg      Intake/Output Summary (Last 24 hours) at 12/24/2020 1614 Last data filed at 12/24/2020 1300 Gross per 24 hour  Intake 720 ml  Output 1050 ml  Net -330 ml   Filed Weights   12/20/20 0500 12/21/20 0606 12/24/20 0433  Weight: 92.3 kg 92.5 kg 90.3 kg    Examination:  General exam: Mild distress.  Appears fatigued Respiratory system: Lungs clear.  Normal work of breathing.  Room air Cardiovascular system: S1 & S2 heard, RRR. No JVD, murmurs, rubs, gallops or clicks.  No pitting edema. Gastrointestinal system: Abdomen is nondistended, soft and nontender. No organomegaly or masses felt. Normal bowel sounds heard. Central nervous system: Alert and oriented x2.  No focal deficits Extremities: Symmetric 5 x 5 power. Skin: No rashes, lesions or ulcers Psychiatry: Judgement and insight appear impaired. Mood & affect flattened.     Data Reviewed: I have personally reviewed following labs and imaging studies  CBC: Recent Labs  Lab 12/20/20 0453 12/21/20 0503 12/22/20 0420 12/23/20 0358 12/24/20 0509  WBC 6.9 6.8 8.7 16.7* 8.0  NEUTROABS 4.1 3.4 5.5 14.5* 5.5  HGB 7.5* 7.6* 7.7* 7.7* 7.2*  HCT 22.6* 23.5* 23.8* 23.6* 21.8*  MCV 86.3 87.7 87.2 88.4 86.9  PLT 278 277 280 272  353   Basic Metabolic Panel: Recent Labs  Lab 12/20/20 0453 12/21/20 0503 12/22/20 0420 12/23/20 0358 12/24/20 0509  NA 138 136 138 137 134*  K 3.9 3.7 4.0 4.2 4.3  CL 110 108 106 107 103  CO2 21* 21* 21* 21* 20*  GLUCOSE 216* 112* 151* 177* 197*  BUN 49* 48* 55* 58* 64*  CREATININE 3.19* 3.06* 3.45* 3.64* 4.07*  CALCIUM 8.3* 8.5* 8.6* 8.4* 8.1*   GFR: Estimated Creatinine Clearance: 14.6 mL/min (A) (by C-G formula based on SCr of 4.07 mg/dL (H)). Liver Function Tests: No results for input(s): AST, ALT, ALKPHOS, BILITOT, PROT, ALBUMIN in the last 168 hours. No results for input(s): LIPASE, AMYLASE in the last 168 hours. No results for input(s): AMMONIA in the last 168 hours. Coagulation Profile: No results for input(s): INR, PROTIME in the last 168 hours. Cardiac Enzymes: No results for input(s): CKTOTAL, CKMB, CKMBINDEX, TROPONINI in the last 168 hours. BNP (last 3 results) No results for input(s): PROBNP in the last 8760 hours. HbA1C: No results for input(s): HGBA1C in the last 72 hours. CBG: Recent Labs  Lab 12/23/20 1132 12/23/20 1619 12/23/20 2036 12/24/20 0737 12/24/20 1143  GLUCAP 221* 190* 235* 181* 191*   Lipid Profile: No results for input(s): CHOL, HDL, LDLCALC,  TRIG, CHOLHDL, LDLDIRECT in the last 72 hours. Thyroid Function Tests: No results for input(s): TSH, T4TOTAL, FREET4, T3FREE, THYROIDAB in the last 72 hours. Anemia Panel: No results for input(s): VITAMINB12, FOLATE, FERRITIN, TIBC, IRON, RETICCTPCT in the last 72 hours. Sepsis Labs: No results for input(s): PROCALCITON, LATICACIDVEN in the last 168 hours.  Recent Results (from the past 240 hour(s))  SARS CORONAVIRUS 2 (TAT 6-24 HRS) Nasopharyngeal Nasopharyngeal Swab     Status: None   Collection Time: 12/14/20  8:28 PM   Specimen: Nasopharyngeal Swab  Result Value Ref Range Status   SARS Coronavirus 2 NEGATIVE NEGATIVE Final    Comment: (NOTE) SARS-CoV-2 target nucleic acids are NOT  DETECTED.  The SARS-CoV-2 RNA is generally detectable in upper and lower respiratory specimens during the acute phase of infection. Negative results do not preclude SARS-CoV-2 infection, do not rule out co-infections with other pathogens, and should not be used as the sole basis for treatment or other patient management decisions. Negative results must be combined with clinical observations, patient history, and epidemiological information. The expected result is Negative.  Fact Sheet for Patients: SugarRoll.be  Fact Sheet for Healthcare Providers: https://www.woods-mathews.com/  This test is not yet approved or cleared by the [place] FDA and  has been authorized for detection and/or diagnosis of SARS-CoV-2 by FDA under an Emergency Use Authorization (EUA). This EUA will remain  in effect (meaning this test can be used) for the duration of the COVID-19 declaration under Se ction 564(b)(1) of the Act, 21 U.S.C. section 360bbb-3(b)(1), unless the authorization is terminated or revoked sooner.  Performed at [facility], [place] [address] [place], [place] [address]   SARS CORONAVIRUS 2 (TAT 6-24 HRS) Nasopharyngeal Nasopharyngeal Swab     Status: None   Collection Time: 12/19/20  4:40 PM   Specimen: Nasopharyngeal Swab  Result Value Ref Range Status   SARS Coronavirus 2 NEGATIVE NEGATIVE Final    Comment: (NOTE) SARS-CoV-2 target nucleic acids are NOT DETECTED.  The SARS-CoV-2 RNA is generally detectable in upper and lower respiratory specimens during the acute phase of infection. Negative results do not preclude SARS-CoV-2 infection, do not rule out co-infections with other pathogens, and should not be used as the sole basis for treatment or other patient management decisions. Negative results must be combined with clinical observations, patient history, and epidemiological information. The expected result is Negative.  Fact  Sheet for Patients: SugarRoll.be  Fact Sheet for Healthcare Providers: https://www.woods-mathews.com/  This test is not yet approved or cleared by the [place] FDA and  has been authorized for detection and/or diagnosis of SARS-CoV-2 by FDA under an Emergency Use Authorization (EUA). This EUA will remain  in effect (meaning this test can be used) for the duration of the COVID-19 declaration under Se ction 564(b)(1) of the Act, 21 U.S.C. section 360bbb-3(b)(1), unless the authorization is terminated or revoked sooner.  Performed at [facility], [place] [address], [place], [place] [address]          Radiology Studies: CT ABDOMEN PELVIS WO CONTRAST  Result Date: 12/22/2020 CLINICAL DATA:  Urologic cancer, surveillance cycstic mass Cystic mass seen on renal ultrasound. EXAM: CT ABDOMEN AND PELVIS WITHOUT CONTRAST TECHNIQUE: Multidetector CT imaging of the abdomen and pelvis was performed following the standard protocol without IV contrast. COMPARISON:  Renal ultrasound earlier today. Abdominopelvic CT 10/15/2016 FINDINGS: Lower chest: The heart is enlarged. Dense mitral annulus calcifications. There are coronary artery calcifications. Small right pleural effusion. No confluent  basilar consolidation. Hepatobiliary: No evidence of focal liver abnormality on this noncontrast exam. Cholecystectomy without biliary dilatation. Pancreas: Fatty atrophy about the head and uncinate process. No ductal dilatation or inflammation. Spleen: Normal in size without focal abnormality. Adrenals/Urinary Tract: Mild adrenal thickening without dominant nodule. Mild bilateral renal parenchymal thinning with cortical scarring in the mid left kidney. No hydronephrosis. Slight perinephric edema. Renal cyst cyst seen on ultrasound. There is no evidence of suspicious renal lesion. No renal calculi. Decompressed ureters. Bladder wall thickening with minimal layering debris.  Mild perivesicular fat stranding. Stomach/Bowel: The stomach is unremarkable. Administered enteric contrast reaches the mid small bowel. There is no small bowel obstruction or small bowel inflammation. Appendix is not visualized, no appendicitis. Fluid-filled hepatic flexure, transverse, and distal colon. There is associated colonic wall thickening at the splenic flexure, descending, and sigmoid colon with mild pericolonic edema. Sigmoid colonic diverticulosis without focal diverticulitis. Circumferential rectal wall thickening with moderate perirectal edema. There is no bowel pneumatosis or perforation. Vascular/Lymphatic: Advanced aortic and branch atherosclerosis. No aortic aneurysm. Small retroperitoneal lymph nodes, not enlarged by size criteria. Reproductive: Uterus is surgically absent.  No adnexal mass. Other: There is no intra-abdominopelvic mass or cyst corresponding to that seen on recent ultrasound. No ascites or free fluid. No free air. No focal fluid collection. There is a small fat containing umbilical hernia. Scattered subcutaneous edema of the subcutaneous tissues. Postsurgical change of the lower abdominal wall. Musculoskeletal: L4-L5 posterior lumbar fusion. Bones diffusely under mineralized. Degenerative change in the lumbar spine. Prominent Schmorl's node in the superior endplate of L1. IMPRESSION: 1. No intra-abdominopelvic cyst or cystic mass to correlate with recent ultrasound. 2. Colonic wall thickening with pericolonic edema at the splenic flexure, descending, and sigmoid colon, consistent with colitis. Sigmoid colonic diverticulosis without focal diverticulitis. 3. Prominent rectal wall thickening and perirectal edema. 4. Bladder wall thickening with minimal layering debris, can be seen with urinary tract renal cysts, as seen on recent ultrasound. 5. Small right pleural effusion. 6. Advanced aortic and branch atherosclerosis. Aortic Atherosclerosis (ICD10-I70.0). Electronically Signed    By: Keith Rake M.D.   On: 12/22/2020 18:59         Scheduled Meds:  aspirin  81 mg Oral Daily   atorvastatin  20 mg Oral QHS   cloNIDine  0.3 mg Oral BID   clopidogrel  75 mg Oral Daily   gabapentin  200 mg Oral BID   heparin  5,000 Units Subcutaneous Q8H   hydrALAZINE  100 mg Oral TID   insulin aspart  0-15 Units Subcutaneous TID WC   insulin glargine  10 Units Subcutaneous QHS   [START ON 12/25/2020] levETIRAcetam  500 mg Oral Q24H   nystatin  1 application Topical BID   pantoprazole  40 mg Oral Daily   polyethylene glycol  17 g Oral Daily   senna-docusate  2 tablet Oral BID   vitamin B-12  250 mcg Oral Daily   Continuous Infusions:  sodium chloride       LOS: 8 days    Time spent: 25 minutes    Max Sane, MD [facility] Pager [phone]  If 7PM-7AM, please contact night-coverage 12/24/2020, 4:14 PM

## 2020-12-24 NOTE — Progress Notes (Signed)
   12/24/20 1315  Clinical Encounter Type  Visited With Patient and family together  Visit Type Initial;Spiritual support;Social support  Referral From Other (Comment) (Checking while I responded to a code)  Holley checked on PT's well-being while responding to a code taking place nearby. Chaplain Burris provided reflective listening and compassionate presence. Offered general support and encouragement. 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

## 2020-12-24 NOTE — Progress Notes (Signed)
Occupational Therapy Treatment Patient Details Name: Susan House MRN: [id number] DOB: Aug 23, 1955 Today's Date: 12/24/2020    History of present illness Pt is a 65 y.o. female with medical history significant for history of CVA with residual left-sided weakness, left frontal encephalomalacia, CKD stage IIIb-IV, insulin-dependent type 2 diabetes, hypertension, hyperlipidemia, seizure disorder, and memory loss who presents to the ED for evaluation of generalized weakness and abnormal labs, potential UTI. Patient recently admitted 11/28/2020-12/03/2020 for AKI on CKD, discharged to SNF.   OT comments  Susan House was seen for OT treatment on this date. Upon arrival to room pt seated in recliner eager for return to bed, husband at bedside. Pt requires MOD A don/doff gown seated EOC, MAX A don/doff B socks. Pt completes seated therex as described below, increased time to complete c LLE. MOD A x2 for Squat Pivot T/F chair>bed. SETUP hand washing using wipe at bed level - pt instructed on importance of maintaining skin integrity to prevent breakdown. Instructed on wearing schedule for resting hand splint - RN notified. Pt making good progress toward goals. Pt continues to benefit from skilled OT services to maximize return to PLOF and minimize risk of future falls, injury, caregiver burden, and readmission. Will continue to follow POC. Discharge recommendation remains appropriate.    Follow Up Recommendations  SNF;Supervision/Assistance - 24 hour    Equipment Recommendations  Other (comment) (defer)    Recommendations for Other Services      Precautions / Restrictions Precautions Precautions: Fall Restrictions Weight Bearing Restrictions: No       Mobility Bed Mobility Overal bed mobility: Needs Assistance Bed Mobility: Sit to Supine Rolling: Max assist   Supine to sit: Max assist Sit to supine: Max assist   General bed mobility comments: rolled L to short sit EOB     Transfers Overall transfer level: Needs assistance Equipment used: None (squat pivot with gait belt and BLEs knee blocking) Transfers: Squat Pivot Transfers Sit to Stand: Max assist;From elevated surface   Squat pivot transfers: Mod assist;+2 physical assistance     General transfer comment: Pt performed STS EOB 3 x prior to squat pivot to recliner. max assist + vcs. able to static stand with assist for short periods of time.    Balance Overall balance assessment: Needs assistance Sitting-balance support: Feet supported;No upper extremity supported Sitting balance-Leahy Scale: Fair Sitting balance - Comments: Pt required CGA for safety while sitting EOB with feet support and UE support   Standing balance support: Bilateral upper extremity supported Standing balance-Leahy Scale: Poor Standing balance comment: Extremely high fall risk. Poor strength and activity tolerance.                           ADL either performed or assessed with clinical judgement   ADL Overall ADL's : Needs assistance/impaired                                       General ADL Comments: MOD A don/doff gown seated EOC. SETUP hand washing using wipe at bed level - pt instructed on importance of maintaining skin integrity to prevent breakdown. MOD A x2 for ADL t/f.      Cognition Arousal/Alertness: Awake/alert Behavior During Therapy: WFL for tasks assessed/performed Overall Cognitive Status: History of cognitive impairments - at baseline  General Comments: MIN cues t/o        Exercises Exercises: Other exercises;General Lower Extremity General Exercises - Lower Extremity Long Arc Quad: AROM;Strengthening;Both;20 reps;Seated Toe Raises: AROM;Strengthening;Both;20 reps;Seated Heel Raises: AROM;Strengthening;Both;20 reps;Seated Other Exercises Other Exercises: Pt and husband educated re: OT role, DME recs, d/c recs, falls  prevention, ECS, resting hand splint wearing schedule, skin integrity Other Exercises: LBD, UBD, SPT, sit>sup, sitting/standing balance/tolerance   Shoulder Instructions       General Comments MD messaged about resting hand splint and PRAFO for L side. Recommend only using at night 2/2 to pt does have active movements in both UE/LE    Pertinent Vitals/ Pain       Pain Assessment: No/denies pain         Frequency  Min 2X/week        Progress Toward Goals  OT Goals(current goals can now be found in the care plan section)  Progress towards OT goals: Progressing toward goals  Acute Rehab OT Goals Patient Stated Goal: To get stronger OT Goal Formulation: With patient Time For Goal Achievement: 12/30/20 Potential to Achieve Goals: Fair ADL Goals Pt Will Perform Grooming: sitting;with set-up;with supervision Pt Will Perform Lower Body Dressing: with mod assist;sit to/from stand Pt Will Transfer to Toilet: with mod assist Pt Will Perform Toileting - Clothing Manipulation and hygiene: with mod assist  Plan Discharge plan remains appropriate;Frequency remains appropriate    Co-evaluation                 AM-PAC OT "6 Clicks" Daily Activity     Outcome Measure   Help from another person eating meals?: A Little Help from another person taking care of personal grooming?: A Little Help from another person toileting, which includes using toliet, bedpan, or urinal?: Total Help from another person bathing (including washing, rinsing, drying)?: A Lot Help from another person to put on and taking off regular upper body clothing?: A Lot Help from another person to put on and taking off regular lower body clothing?: A Lot 6 Click Score: 13    End of Session    OT Visit Diagnosis: Unsteadiness on feet (R26.81);Muscle weakness (generalized) (M62.81)   Activity Tolerance Patient tolerated treatment well   Patient Left in bed;with call bell/phone within reach;with bed alarm  set;with family/visitor present   Nurse Communication Mobility status        Time: 9295-7473 OT Time Calculation (min): 25 min  Charges: OT General Charges $OT Visit: 1 Visit OT Treatments $Self Care/Home Management : 8-22 mins $Therapeutic Exercise: 8-22 mins   Dessie Coma, M.S. OTR/L  12/24/20, 2:39 PM  ascom [phone]

## 2020-12-24 NOTE — Progress Notes (Addendum)
[facility]  ROUNDING NOTE   Subjective:   Susan House is a 65 y.o. female with medical conditions including CVA with left sided weakness, Type 2 diabetes, hyperlipidemia, seizures, hypertension and CKD 3b. Patient presents ED with weakness and abnormal labs.   Patient seen laying in bed Breakfast tray at bedside Denies nausea and vomiting Denies shortness of breath Patient seen later sitting in chair   Objective:  Vital signs in last 24 hours:  Temp:  [97.3 F (36.3 C)-98.2 F (36.8 C)] 98.2 F (36.8 C) (06/10 0746) Pulse Rate:  [54-63] 63 (06/10 0746) Resp:  [14-18] 14 (06/10 0746) BP: (99-156)/(41-91) 155/55 (06/10 0746) SpO2:  [96 %-100 %] 98 % (06/10 0746) Weight:  [90.3 kg] 90.3 kg (06/10 0433)  Weight change:  Filed Weights   12/20/20 0500 12/21/20 0606 12/24/20 0433  Weight: 92.3 kg 92.5 kg 90.3 kg    Intake/Output: I/O last 3 completed shifts: In: 720 [P.O.:720] Out: 750 [Urine:750]   Intake/Output this shift:  No intake/output data recorded.  Physical Exam: General: NAD, laying in bed  Head: Normocephalic, atraumatic. Moist oral mucosal membranes  Eyes: Anicteric  Lungs:  Clear ,normal breathing effort  Heart: Regular rate and rhythm  Abdomen:  Soft, nontender   Extremities:  no peripheral edema.  Neurologic: Alert moving all four extremities  Skin: No lesions       Basic Metabolic Panel: Recent Labs  Lab 12/20/20 0453 12/21/20 0503 12/22/20 0420 12/23/20 0358 12/24/20 0509  NA 138 136 138 137 134*  K 3.9 3.7 4.0 4.2 4.3  CL 110 108 106 107 103  CO2 21* 21* 21* 21* 20*  GLUCOSE 216* 112* 151* 177* 197*  BUN 49* 48* 55* 58* 64*  CREATININE 3.19* 3.06* 3.45* 3.64* 4.07*  CALCIUM 8.3* 8.5* 8.6* 8.4* 8.1*     Liver Function Tests: No results for input(s): AST, ALT, ALKPHOS, BILITOT, PROT, ALBUMIN in the last 168 hours. No results for input(s): LIPASE, AMYLASE in the last 168 hours. No results for input(s): AMMONIA  in the last 168 hours.  CBC: Recent Labs  Lab 12/20/20 0453 12/21/20 0503 12/22/20 0420 12/23/20 0358 12/24/20 0509  WBC 6.9 6.8 8.7 16.7* 8.0  NEUTROABS 4.1 3.4 5.5 14.5* 5.5  HGB 7.5* 7.6* 7.7* 7.7* 7.2*  HCT 22.6* 23.5* 23.8* 23.6* 21.8*  MCV 86.3 87.7 87.2 88.4 86.9  PLT 278 277 280 272 232     Cardiac Enzymes: No results for input(s): CKTOTAL, CKMB, CKMBINDEX, TROPONINI in the last 168 hours.  BNP: Invalid input(s): POCBNP  CBG: Recent Labs  Lab 12/23/20 0723 12/23/20 1132 12/23/20 1619 12/23/20 2036 12/24/20 0737  GLUCAP 173* 221* 190* 235* 181*     Microbiology: Results for orders placed or performed during the hospital encounter of 12/14/20  SARS CORONAVIRUS 2 (TAT 6-24 HRS) Nasopharyngeal Nasopharyngeal Swab     Status: None   Collection Time: 12/14/20  8:28 PM   Specimen: Nasopharyngeal Swab  Result Value Ref Range Status   SARS Coronavirus 2 NEGATIVE NEGATIVE Final    Comment: (NOTE) SARS-CoV-2 target nucleic acids are NOT DETECTED.  The SARS-CoV-2 RNA is generally detectable in upper and lower respiratory specimens during the acute phase of infection. Negative results do not preclude SARS-CoV-2 infection, do not rule out co-infections with other pathogens, and should not be used as the sole basis for treatment or other patient management decisions. Negative results must be combined with clinical observations, patient history, and epidemiological information. The expected result  is Negative.  Fact Sheet for Patients: SugarRoll.be  Fact Sheet for Healthcare Providers: https://www.woods-mathews.com/  This test is not yet approved or cleared by the [place] FDA and  has been authorized for detection and/or diagnosis of SARS-CoV-2 by FDA under an Emergency Use Authorization (EUA). This EUA will remain  in effect (meaning this test can be used) for the duration of the COVID-19 declaration under Se  ction 564(b)(1) of the Act, 21 U.S.C. section 360bbb-3(b)(1), unless the authorization is terminated or revoked sooner.  Performed at [facility], [place] [address] [place], [place] [address]   SARS CORONAVIRUS 2 (TAT 6-24 HRS) Nasopharyngeal Nasopharyngeal Swab     Status: None   Collection Time: 12/19/20  4:40 PM   Specimen: Nasopharyngeal Swab  Result Value Ref Range Status   SARS Coronavirus 2 NEGATIVE NEGATIVE Final    Comment: (NOTE) SARS-CoV-2 target nucleic acids are NOT DETECTED.  The SARS-CoV-2 RNA is generally detectable in upper and lower respiratory specimens during the acute phase of infection. Negative results do not preclude SARS-CoV-2 infection, do not rule out co-infections with other pathogens, and should not be used as the sole basis for treatment or other patient management decisions. Negative results must be combined with clinical observations, patient history, and epidemiological information. The expected result is Negative.  Fact Sheet for Patients: SugarRoll.be  Fact Sheet for Healthcare Providers: https://www.woods-mathews.com/  This test is not yet approved or cleared by the [place] FDA and  has been authorized for detection and/or diagnosis of SARS-CoV-2 by FDA under an Emergency Use Authorization (EUA). This EUA will remain  in effect (meaning this test can be used) for the duration of the COVID-19 declaration under Se ction 564(b)(1) of the Act, 21 U.S.C. section 360bbb-3(b)(1), unless the authorization is terminated or revoked sooner.  Performed at [facility], [place] [address], [place], [place] [address]     Coagulation Studies: No results for input(s): LABPROT, INR in the last 72 hours.  Urinalysis: No results for input(s): COLORURINE, LABSPEC, PHURINE, GLUCOSEU, HGBUR, BILIRUBINUR, KETONESUR, PROTEINUR, UROBILINOGEN, NITRITE, LEUKOCYTESUR in the last 72 hours.  Invalid input(s):  APPERANCEUR    Imaging: CT ABDOMEN PELVIS WO CONTRAST  Result Date: 12/22/2020 CLINICAL DATA:  Urologic cancer, surveillance cycstic mass Cystic mass seen on renal ultrasound. EXAM: CT ABDOMEN AND PELVIS WITHOUT CONTRAST TECHNIQUE: Multidetector CT imaging of the abdomen and pelvis was performed following the standard protocol without IV contrast. COMPARISON:  Renal ultrasound earlier today. Abdominopelvic CT 10/15/2016 FINDINGS: Lower chest: The heart is enlarged. Dense mitral annulus calcifications. There are coronary artery calcifications. Small right pleural effusion. No confluent basilar consolidation. Hepatobiliary: No evidence of focal liver abnormality on this noncontrast exam. Cholecystectomy without biliary dilatation. Pancreas: Fatty atrophy about the head and uncinate process. No ductal dilatation or inflammation. Spleen: Normal in size without focal abnormality. Adrenals/Urinary Tract: Mild adrenal thickening without dominant nodule. Mild bilateral renal parenchymal thinning with cortical scarring in the mid left kidney. No hydronephrosis. Slight perinephric edema. Renal cyst cyst seen on ultrasound. There is no evidence of suspicious renal lesion. No renal calculi. Decompressed ureters. Bladder wall thickening with minimal layering debris. Mild perivesicular fat stranding. Stomach/Bowel: The stomach is unremarkable. Administered enteric contrast reaches the mid small bowel. There is no small bowel obstruction or small bowel inflammation. Appendix is not visualized, no appendicitis. Fluid-filled hepatic flexure, transverse, and distal colon. There is associated colonic wall thickening at the splenic flexure, descending, and sigmoid colon with mild pericolonic edema.  Sigmoid colonic diverticulosis without focal diverticulitis. Circumferential rectal wall thickening with moderate perirectal edema. There is no bowel pneumatosis or perforation. Vascular/Lymphatic: Advanced aortic and branch  atherosclerosis. No aortic aneurysm. Small retroperitoneal lymph nodes, not enlarged by size criteria. Reproductive: Uterus is surgically absent.  No adnexal mass. Other: There is no intra-abdominopelvic mass or cyst corresponding to that seen on recent ultrasound. No ascites or free fluid. No free air. No focal fluid collection. There is a small fat containing umbilical hernia. Scattered subcutaneous edema of the subcutaneous tissues. Postsurgical change of the lower abdominal wall. Musculoskeletal: L4-L5 posterior lumbar fusion. Bones diffusely under mineralized. Degenerative change in the lumbar spine. Prominent Schmorl's node in the superior endplate of L1. IMPRESSION: 1. No intra-abdominopelvic cyst or cystic mass to correlate with recent ultrasound. 2. Colonic wall thickening with pericolonic edema at the splenic flexure, descending, and sigmoid colon, consistent with colitis. Sigmoid colonic diverticulosis without focal diverticulitis. 3. Prominent rectal wall thickening and perirectal edema. 4. Bladder wall thickening with minimal layering debris, can be seen with urinary tract renal cysts, as seen on recent ultrasound. 5. Small right pleural effusion. 6. Advanced aortic and branch atherosclerosis. Aortic Atherosclerosis (ICD10-I70.0). Electronically Signed   By: Keith Rake M.D.   On: 12/22/2020 18:59   US RENAL ARTERY DUPLEX COMPLETE  Result Date: 12/22/2020 CLINICAL DATA:  Renal vascular hypertension EXAM: RENAL/URINARY TRACT ULTRASOUND RENAL DUPLEX DOPPLER ULTRASOUND COMPARISON:  Ultrasound kidneys 12/16/2020 FINDINGS: Right Kidney: Length: 10.2 cm. Mild increased cortical echogenicity without significant thinning. No mass or hydronephrosis. Two simple right renal cysts with the larger measuring 2.4 cm. Left Kidney: Length: 11.0 cm. Mild increased cortical echogenicity without significant thinning. No mass or hydronephrosis. Septated cyst noted in the mid left kidney measuring 1.9 cm. Bladder:   Not visualized. RENAL DUPLEX ULTRASOUND Right Renal Artery Velocities: Origin:  86 cm/sec Mid:  66 cm/sec [place]:  28 cm/sec Interlobar:  10 cm/sec Arcuate:  11 cm/sec Left Renal Artery Velocities: Origin:  57 cm/sec Mid:  69 cm/sec [place]:  24 cm/sec Interlobar:  19 cm/sec Arcuate:  16 cm/sec Aortic Velocity: Unable to visualize aorta due to patient body habitus and shadowing bowel gas. Right Renal-Aortic Ratios: Unable to obtain as the aorta could not be visualized. Left Renal-Aortic Ratios: Unable to obtained as the aorta could not be visualized. IMPRESSION: 1. Renal artery velocities within normal limits. 2. Nonvisualization of the abdominal aorta, therefore renal artery-aorta ratios could not be measured. 3. Partially visualized cystic mass noted in the right abdomen measuring 10.8 x 5.6 cm is of uncertain etiology. Further evaluation with CT of the abdomen and pelvis should be performed to better evaluate this structure. These results will be called to the ordering clinician or representative by the Radiologist Assistant, and communication documented in the PACS or [employer]. Electronically Signed   By: Miachel Roux M.D.   On: 12/22/2020 12:52     Medications:     amLODipine  10 mg Oral Daily   aspirin  81 mg Oral Daily   atorvastatin  20 mg Oral QHS   cloNIDine  0.3 mg Oral BID   clopidogrel  75 mg Oral Daily   enalapril  2.5 mg Oral Daily   gabapentin  200 mg Oral BID   heparin  5,000 Units Subcutaneous Q8H   hydrALAZINE  100 mg Oral TID   insulin aspart  0-15 Units Subcutaneous TID WC   insulin glargine  10 Units Subcutaneous QHS   isosorbide mononitrate  30 mg Oral  Daily   levETIRAcetam  500 mg Oral BID   metoprolol tartrate  100 mg Oral BID   nystatin  1 application Topical BID   pantoprazole  40 mg Oral Daily   polyethylene glycol  17 g Oral Daily   senna-docusate  2 tablet Oral BID   vitamin B-12  250 mcg Oral Daily   acetaminophen **OR** acetaminophen,  HYDROcodone-acetaminophen, hydrocortisone, iohexol, ondansetron **OR** ondansetron (ZOFRAN) IV, senna-docusate  Assessment/ Plan:  [name] is a 65 y.o.  female who was admitted to [facility] on 11/28/2020 for AKI on CKD. Patient has previous medical conditions including diabetes, hypertension, CVA with left hemiplegia residual, morbid obesity and chronic kidney disease stage 3B. She presents to the ED with weakness and abnormal labs.    Acute Kidney Injury on chronic kidney disease stage 3B with baseline creatinine 2.32 and GFR of 23 on 12/03/20.  Acute kidney injury secondary to ATN due to poor intake vs uncontrolled hypertension Chronic kidney disease is secondary to uncontrolled diabetes No acute need for dialysis Renal US shows simple renal cyst, but no obstruction Creatinine continues to increase Currently 4.07 Will monitor   Lab Results  Component Value Date   CREATININE 4.07 (H) 12/24/2020   CREATININE 3.64 (H) 12/23/2020   CREATININE 3.45 (H) 12/22/2020    Intake/Output Summary (Last 24 hours) at 12/24/2020 0938 Last data filed at 12/24/2020 0533 Gross per 24 hour  Intake 720 ml  Output 400 ml  Net 320 ml     2. Hypertension:  Home regimen includes Amlodipine, Clonidine, Enalapril, Hydralazine, and Metoprolol.  Renal doppler completed this morning to evlauate renovascular hypertension. Findings consist of cystic mass in the right abdomen. Follow up CT negative for significant concerns D/c Enalapril due to increased creatinine. Will continue to review medications that will effectively control BP  3. Diabetes mellitus type II with chronic kidney disease Insulin dependent with Metformin and Lantus prescribed for home Hgb A1c 8.6 on 11/29/20 SSi ordered for management     LOS: 8 [place] 6/10/20229:38 AM

## 2020-12-25 DIAGNOSIS — Z8673 Personal history of transient ischemic attack (TIA), and cerebral infarction without residual deficits: Secondary | ICD-10-CM | POA: Diagnosis not present

## 2020-12-25 DIAGNOSIS — I69359 Hemiplegia and hemiparesis following cerebral infarction affecting unspecified side: Secondary | ICD-10-CM | POA: Diagnosis not present

## 2020-12-25 DIAGNOSIS — N189 Chronic kidney disease, unspecified: Secondary | ICD-10-CM | POA: Diagnosis not present

## 2020-12-25 DIAGNOSIS — N179 Acute kidney failure, unspecified: Secondary | ICD-10-CM | POA: Diagnosis not present

## 2020-12-25 LAB — GLUCOSE, CAPILLARY
Glucose-Capillary: 210 mg/dL — ABNORMAL HIGH (ref 70–99)
Glucose-Capillary: 217 mg/dL — ABNORMAL HIGH (ref 70–99)
Glucose-Capillary: 207 mg/dL — ABNORMAL HIGH (ref 70–99)
Glucose-Capillary: 161 mg/dL — ABNORMAL HIGH (ref 70–99)

## 2020-12-25 LAB — CBC WITH DIFFERENTIAL/PLATELET
Abs Immature Granulocytes: 0.05 10*3/uL (ref 0.00–0.07)
Basophils Absolute: 0 10*3/uL (ref 0.0–0.1)
Basophils Relative: 0 %
Eosinophils Absolute: 0.3 10*3/uL (ref 0.0–0.5)
Eosinophils Relative: 4 %
HCT: 22.9 % — ABNORMAL LOW (ref 36.0–46.0)
Hemoglobin: 7.4 g/dL — ABNORMAL LOW (ref 12.0–15.0)
Immature Granulocytes: 1 %
Lymphocytes Relative: 20 %
Lymphs Abs: 1.3 10*3/uL (ref 0.7–4.0)
MCH: 28.6 pg (ref 26.0–34.0)
MCHC: 32.3 g/dL (ref 30.0–36.0)
MCV: 88.4 fL (ref 80.0–100.0)
Monocytes Absolute: 0.6 10*3/uL (ref 0.1–1.0)
Monocytes Relative: 10 %
Neutro Abs: 4.5 10*3/uL (ref 1.7–7.7)
Neutrophils Relative %: 65 %
Platelets: 241 10*3/uL (ref 150–400)
RBC: 2.59 MIL/uL — ABNORMAL LOW (ref 3.87–5.11)
RDW: 14.4 % (ref 11.5–15.5)
WBC: 6.8 10*3/uL (ref 4.0–10.5)
nRBC: 0 % (ref 0.0–0.2)

## 2020-12-25 LAB — BASIC METABOLIC PANEL
Anion gap: 10 (ref 5–15)
BUN: 65 mg/dL — ABNORMAL HIGH (ref 8–23)
CO2: 20 mmol/L — ABNORMAL LOW (ref 22–32)
Calcium: 8.5 mg/dL — ABNORMAL LOW (ref 8.9–10.3)
Chloride: 104 mmol/L (ref 98–111)
Creatinine, Ser: 4.13 mg/dL — ABNORMAL HIGH (ref 0.44–1.00)
GFR, Estimated: 11 mL/min — ABNORMAL LOW (ref >60)
Glucose, Bld: 257 mg/dL — ABNORMAL HIGH (ref 70–99)
Potassium: 4.3 mmol/L (ref 3.5–5.1)
Sodium: 134 mmol/L — ABNORMAL LOW (ref 135–145)

## 2020-12-25 MED ORDER — ISOSORBIDE MONONITRATE ER 30 MG PO TB24
60.0000 mg | ORAL_TABLET | Freq: Every day | ORAL | Status: DC
  Administered 2020-12-25 – 2021-01-19 (×23): 60 mg via ORAL
  Filled 2020-12-25 (×24): qty 2

## 2020-12-25 NOTE — Progress Notes (Signed)
PROGRESS NOTE    Susan House  XQJ:[id number] DOB: 1955-09-10 DOA: 12/14/2020 PCP: Jerrol Banana., MD  Brief Narrative:   65 y.o. female with medical history significant for history of CVA with residual left-sided weakness, left frontal encephalomalacia, CKD stage IIIb-IV, insulin-dependent type 2 diabetes, hypertension, hyperlipidemia, seizure disorder, and memory loss who presents to the ED for evaluation of generalized weakness and abnormal labs.   Patient recently admitted 11/28/2020-12/03/2020 for AKI on CKD managed with IV fluid hydration.  Renal ultrasound showed increased echogenicity compatible with medical renal disease and a 2.7 cm right kidney cyst.  She was discharged to SNF.   Patient states that she was sent to the ED due to abnormal labs.  Found to have AKI on CKD and evidence of urinary tract infection.  Kidney function appears to have peaked at 3.44.  Now slightly downtrending.  Mental status also slowly improving.  Mental status has been waxing and waning.  Discussed this with the patient's daughter.  I feel this is a progression of underlying vascular dementia and patient if may be established a new psychiatric baseline.  Unfortunately blood pressure has been very difficult to control.  Intravenous fluids were discontinued on 6/5 and patient had subsequent deterioration kidney function 6/6.  Nephrology consult requested for recommendations.   Assessment & Plan:   Principal Problem:   Acute kidney injury superimposed on chronic kidney disease ([place]) Active Problems:   Hypertension associated with diabetes ([place])   Type 2 diabetes mellitus with complications ([facility])   Hemiparesis due to old cerebrovascular accident ([place])   Hyperlipidemia associated with type 2 diabetes mellitus ([place])   Hx of completed stroke   AKI (acute kidney injury) ([place])   AKI on CKD stage IIIb Recently admitted for the same.  Renal function progressively worsening.  Similar to  previous presentation likely etiology is ATN in the setting of poorly controlled hypertension as well as possible relative dehydration/weight depletion Creat: 3.45-> 4.13 Nephrology following.  Patient may need to be considered for dialysis if kidney function does not improve Avoid NSAIDs and nonessential nephrotoxins Renal ultrasound with Doppler rule showed 10.8 x 5.6 cm cystic mass on the right side.  CT abdomen pelvis did not show this mass.  Showed some colitis but she is not having any symptoms of colitis so we will hold off treatment.  Also diverticulosis but no diverticulitis.  Refractory hypertension  Stopped amlodipine, metoprolol, and Vasotec due to low blood pressure on 6/11 Continue clonidine, hydralazine at current doses.  Resume Imdur as blood pressure again trending up  Urinary tract infection Completed 3 days of Rocephin.  Anemia of chronic kidney disease and B12 deficiency Hemoglobin 7.4  Patient denies any obvious bleeding.  Continue B12 supplement and monitor for any signs/symptoms of bleeding.   Insulin-dependent type 2 diabetes A1c 8.6%.  Reduce Lantus to 10 units nightly and continue SSI.   History of CVA with residual left-sided weakness/left frontal encephalomalacia Continue aspirin, Plavix, atorvastatin.   Seizure disorder Continue Keppra Seizure precaution  Memory loss Suspected underlying vascular dementia.   She had some reported confusion/delirium prior to arrival.   Baseline level of mentation is unclear CT head significant for chronic volume loss and small vessel ischemic changes consistent with suspected vascular dementia Outpatient follow-up  Constipation Continue Senokot-S and MiraLAX  DVT prophylaxis: SQ heparin Code Status: DNR Family Communication:  Daughter Candace updated over phone [phone] on 6/9 Disposition Plan: Status is: Inpatient  Remains inpatient appropriate because:Inpatient level of  care appropriate due to severity of  illness   Dispo: The patient is from: SNF              Anticipated d/c is to: SNF              Patient currently is not medically stable to d/c.   Difficult to place patient No   AKI on CKD.  Kidney function remains poor and and still slowly worsening.  Work-up in progress.  May require dialysis   Level of care: Med-Surg  Consultants:  Nephrology  Procedures:  None  Antimicrobials:  Ceftriaxone completed   Subjective: No new issues.  Kidney function continues to get worse  Objective: Vitals:   12/24/20 2316 12/25/20 0416 12/25/20 0817 12/25/20 1008  BP: (!) 169/60 (!) 155/43 (!) 175/66 (!) 154/50  Pulse: 61 78 67   Resp: 16 20 18    Temp: (!) 97.5 F (36.4 C) 98.7 F (37.1 C) 97.8 F (36.6 C)   TempSrc: Oral Oral Oral   SpO2: 100% 100% 98%   Weight:      Height:        Intake/Output Summary (Last 24 hours) at 12/25/2020 1205 Last data filed at 12/25/2020 1035 Gross per 24 hour  Intake 480 ml  Output 900 ml  Net -420 ml   Filed Weights   12/20/20 0500 12/21/20 0606 12/24/20 0433  Weight: 92.3 kg 92.5 kg 90.3 kg    Examination:  General exam: Mild distress.  Appears fatigued Respiratory system: Lungs clear.  Normal work of breathing.  Room air Cardiovascular system: S1 & S2 heard, RRR. No JVD, murmurs, rubs, gallops or clicks.  No pitting edema. Gastrointestinal system: Abdomen is nondistended, soft and nontender. No organomegaly or masses felt. Normal bowel sounds heard. Central nervous system: Alert and oriented x2.  No focal deficits Extremities: Symmetric 5 x 5 power. Skin: No rashes, lesions or ulcers Psychiatry: Judgement and insight appear impaired. Mood & affect flattened.     Data Reviewed: I have personally reviewed following labs and imaging studies  CBC: Recent Labs  Lab 12/21/20 0503 12/22/20 0420 12/23/20 0358 12/24/20 0509 12/25/20 0359  WBC 6.8 8.7 16.7* 8.0 6.8  NEUTROABS 3.4 5.5 14.5* 5.5 4.5  HGB 7.6* 7.7* 7.7* 7.2* 7.4*   HCT 23.5* 23.8* 23.6* 21.8* 22.9*  MCV 87.7 87.2 88.4 86.9 88.4  PLT 277 280 272 232 761   Basic Metabolic Panel: Recent Labs  Lab 12/21/20 0503 12/22/20 0420 12/23/20 0358 12/24/20 0509 12/25/20 0359  NA 136 138 137 134* 134*  K 3.7 4.0 4.2 4.3 4.3  CL 108 106 107 103 104  CO2 21* 21* 21* 20* 20*  GLUCOSE 112* 151* 177* 197* 257*  BUN 48* 55* 58* 64* 65*  CREATININE 3.06* 3.45* 3.64* 4.07* 4.13*  CALCIUM 8.5* 8.6* 8.4* 8.1* 8.5*   GFR: Estimated Creatinine Clearance: 14.4 mL/min (A) (by C-G formula based on SCr of 4.13 mg/dL (H)). Liver Function Tests: No results for input(s): AST, ALT, ALKPHOS, BILITOT, PROT, ALBUMIN in the last 168 hours. No results for input(s): LIPASE, AMYLASE in the last 168 hours. No results for input(s): AMMONIA in the last 168 hours. Coagulation Profile: No results for input(s): INR, PROTIME in the last 168 hours. Cardiac Enzymes: No results for input(s): CKTOTAL, CKMB, CKMBINDEX, TROPONINI in the last 168 hours. BNP (last 3 results) No results for input(s): PROBNP in the last 8760 hours. HbA1C: No results for input(s): HGBA1C in the last 72 hours. CBG: Recent Labs  Lab 12/24/20 1143 12/24/20 1633 12/24/20 2049 12/25/20 0715 12/25/20 1153  GLUCAP 191* 200* 203* 210* 217*   Lipid Profile: No results for input(s): CHOL, HDL, LDLCALC, TRIG, CHOLHDL, LDLDIRECT in the last 72 hours. Thyroid Function Tests: No results for input(s): TSH, T4TOTAL, FREET4, T3FREE, THYROIDAB in the last 72 hours. Anemia Panel: No results for input(s): VITAMINB12, FOLATE, FERRITIN, TIBC, IRON, RETICCTPCT in the last 72 hours. Sepsis Labs: No results for input(s): PROCALCITON, LATICACIDVEN in the last 168 hours.  Recent Results (from the past 240 hour(s))  SARS CORONAVIRUS 2 (TAT 6-24 HRS) Nasopharyngeal Nasopharyngeal Swab     Status: None   Collection Time: 12/19/20  4:40 PM   Specimen: Nasopharyngeal Swab  Result Value Ref Range Status   SARS Coronavirus 2  NEGATIVE NEGATIVE Final    Comment: (NOTE) SARS-CoV-2 target nucleic acids are NOT DETECTED.  The SARS-CoV-2 RNA is generally detectable in upper and lower respiratory specimens during the acute phase of infection. Negative results do not preclude SARS-CoV-2 infection, do not rule out co-infections with other pathogens, and should not be used as the sole basis for treatment or other patient management decisions. Negative results must be combined with clinical observations, patient history, and epidemiological information. The expected result is Negative.  Fact Sheet for Patients: SugarRoll.be  Fact Sheet for Healthcare Providers: https://www.woods-mathews.com/  This test is not yet approved or cleared by the [place] FDA and  has been authorized for detection and/or diagnosis of SARS-CoV-2 by FDA under an Emergency Use Authorization (EUA). This EUA will remain  in effect (meaning this test can be used) for the duration of the COVID-19 declaration under Se ction 564(b)(1) of the Act, 21 U.S.C. section 360bbb-3(b)(1), unless the authorization is terminated or revoked sooner.  Performed at [facility], [place] [address] [place], [place] [address]          Radiology Studies: No results found.       Scheduled Meds:  aspirin  81 mg Oral Daily   atorvastatin  20 mg Oral QHS   cloNIDine  0.3 mg Oral BID   clopidogrel  75 mg Oral Daily   gabapentin  200 mg Oral BID   heparin  5,000 Units Subcutaneous Q8H   hydrALAZINE  100 mg Oral TID   insulin aspart  0-15 Units Subcutaneous TID WC   insulin glargine  10 Units Subcutaneous QHS   levETIRAcetam  500 mg Oral Q24H   nystatin  1 application Topical BID   pantoprazole  40 mg Oral Daily   polyethylene glycol  17 g Oral Daily   senna-docusate  2 tablet Oral BID   vitamin B-12  250 mcg Oral Daily   Continuous Infusions:  sodium chloride       LOS: 9 days    Time  spent: 25 minutes    Max Sane, MD [facility] Pager [phone]  If 7PM-7AM, please contact night-coverage 12/25/2020, 12:05 PM

## 2020-12-25 NOTE — Plan of Care (Signed)

## 2020-12-25 NOTE — Progress Notes (Addendum)
[facility]  ROUNDING NOTE   Subjective:   Susan House is a 65 y.o. female with medical conditions including CVA with left sided weakness, Type 2 diabetes, hyperlipidemia, seizures, hypertension and CKD 3b. Patient presents ED with weakness and abnormal labs.   Patient found resting in bed, reports consuming her breakfast, no nausea or vomiting.Denies SOB.    Objective:  Vital signs in last 24 hours:  Temp:  [97.5 F (36.4 C)-98.8 F (37.1 C)] 98.8 F (37.1 C) (06/11 1509) Pulse Rate:  [61-78] 62 (06/11 1509) Resp:  [16-20] 18 (06/11 1509) BP: (136-175)/(43-66) 137/57 (06/11 1509) SpO2:  [96 %-100 %] 96 % (06/11 1509)  Weight change:  Filed Weights   12/20/20 0500 12/21/20 0606 12/24/20 0433  Weight: 92.3 kg 92.5 kg 90.3 kg    Intake/Output: I/O last 3 completed shifts: In: 600 [P.O.:600] Out: 1950 [Urine:1950]   Intake/Output this shift:  Total I/O In: 480 [P.O.:480] Out: 500 [Urine:500]  Physical Exam: General: In no acute distress  Head: Moist oral mucosal membranes  Eyes: Anicteric  Lungs:  normal and symmetrical breathing effort  Heart: S1S2,no rubs or gallops  Abdomen:  Soft, nontender, non distended  Extremities:  no peripheral edema.  Neurologic: Answering simple questions appropriately, left sided weakness noted  Skin: No acute lesions or rashes       Basic Metabolic Panel: Recent Labs  Lab 12/21/20 0503 12/22/20 0420 12/23/20 0358 12/24/20 0509 12/25/20 0359  NA 136 138 137 134* 134*  K 3.7 4.0 4.2 4.3 4.3  CL 108 106 107 103 104  CO2 21* 21* 21* 20* 20*  GLUCOSE 112* 151* 177* 197* 257*  BUN 48* 55* 58* 64* 65*  CREATININE 3.06* 3.45* 3.64* 4.07* 4.13*  CALCIUM 8.5* 8.6* 8.4* 8.1* 8.5*     Liver Function Tests: No results for input(s): AST, ALT, ALKPHOS, BILITOT, PROT, ALBUMIN in the last 168 hours. No results for input(s): LIPASE, AMYLASE in the last 168 hours. No results for input(s): AMMONIA in the last 168  hours.  CBC: Recent Labs  Lab 12/21/20 0503 12/22/20 0420 12/23/20 0358 12/24/20 0509 12/25/20 0359  WBC 6.8 8.7 16.7* 8.0 6.8  NEUTROABS 3.4 5.5 14.5* 5.5 4.5  HGB 7.6* 7.7* 7.7* 7.2* 7.4*  HCT 23.5* 23.8* 23.6* 21.8* 22.9*  MCV 87.7 87.2 88.4 86.9 88.4  PLT 277 280 272 232 241     Cardiac Enzymes: No results for input(s): CKTOTAL, CKMB, CKMBINDEX, TROPONINI in the last 168 hours.  BNP: Invalid input(s): POCBNP  CBG: Recent Labs  Lab 12/24/20 1143 12/24/20 1633 12/24/20 2049 12/25/20 0715 12/25/20 1153  GLUCAP 191* 200* 203* 210* 217*     Microbiology: Results for orders placed or performed during the hospital encounter of 12/14/20  SARS CORONAVIRUS 2 (TAT 6-24 HRS) Nasopharyngeal Nasopharyngeal Swab     Status: None   Collection Time: 12/14/20  8:28 PM   Specimen: Nasopharyngeal Swab  Result Value Ref Range Status   SARS Coronavirus 2 NEGATIVE NEGATIVE Final    Comment: (NOTE) SARS-CoV-2 target nucleic acids are NOT DETECTED.  The SARS-CoV-2 RNA is generally detectable in upper and lower respiratory specimens during the acute phase of infection. Negative results do not preclude SARS-CoV-2 infection, do not rule out co-infections with other pathogens, and should not be used as the sole basis for treatment or other patient management decisions. Negative results must be combined with clinical observations, patient history, and epidemiological information. The expected result is Negative.  Fact Sheet for  Patients: SugarRoll.be  Fact Sheet for Healthcare Providers: https://www.woods-mathews.com/  This test is not yet approved or cleared by the [place] FDA and  has been authorized for detection and/or diagnosis of SARS-CoV-2 by FDA under an Emergency Use Authorization (EUA). This EUA will remain  in effect (meaning this test can be used) for the duration of the COVID-19 declaration under Se ction 564(b)(1) of  the Act, 21 U.S.C. section 360bbb-3(b)(1), unless the authorization is terminated or revoked sooner.  Performed at [facility], [place] [address] [place], [place] [address]   SARS CORONAVIRUS 2 (TAT 6-24 HRS) Nasopharyngeal Nasopharyngeal Swab     Status: None   Collection Time: 12/19/20  4:40 PM   Specimen: Nasopharyngeal Swab  Result Value Ref Range Status   SARS Coronavirus 2 NEGATIVE NEGATIVE Final    Comment: (NOTE) SARS-CoV-2 target nucleic acids are NOT DETECTED.  The SARS-CoV-2 RNA is generally detectable in upper and lower respiratory specimens during the acute phase of infection. Negative results do not preclude SARS-CoV-2 infection, do not rule out co-infections with other pathogens, and should not be used as the sole basis for treatment or other patient management decisions. Negative results must be combined with clinical observations, patient history, and epidemiological information. The expected result is Negative.  Fact Sheet for Patients: SugarRoll.be  Fact Sheet for Healthcare Providers: https://www.woods-mathews.com/  This test is not yet approved or cleared by the [place] FDA and  has been authorized for detection and/or diagnosis of SARS-CoV-2 by FDA under an Emergency Use Authorization (EUA). This EUA will remain  in effect (meaning this test can be used) for the duration of the COVID-19 declaration under Se ction 564(b)(1) of the Act, 21 U.S.C. section 360bbb-3(b)(1), unless the authorization is terminated or revoked sooner.  Performed at [facility], [place] [address] [place], [place] [address]     Coagulation Studies: No results for input(s): LABPROT, INR in the last 72 hours.  Urinalysis: No results for input(s): COLORURINE, LABSPEC, PHURINE, GLUCOSEU, HGBUR, BILIRUBINUR, KETONESUR, PROTEINUR, UROBILINOGEN, NITRITE, LEUKOCYTESUR in the last 72 hours.  Invalid input(s): APPERANCEUR     Imaging: No results found.   Medications:    sodium chloride      aspirin  81 mg Oral Daily   atorvastatin  20 mg Oral QHS   cloNIDine  0.3 mg Oral BID   clopidogrel  75 mg Oral Daily   gabapentin  200 mg Oral BID   heparin  5,000 Units Subcutaneous Q8H   hydrALAZINE  100 mg Oral TID   insulin aspart  0-15 Units Subcutaneous TID WC   insulin glargine  10 Units Subcutaneous QHS   isosorbide mononitrate  60 mg Oral Daily   levETIRAcetam  500 mg Oral Q24H   nystatin  1 application Topical BID   pantoprazole  40 mg Oral Daily   polyethylene glycol  17 g Oral Daily   senna-docusate  2 tablet Oral BID   vitamin B-12  250 mcg Oral Daily   acetaminophen **OR** acetaminophen, HYDROcodone-acetaminophen, hydrocortisone, iohexol, ondansetron **OR** ondansetron (ZOFRAN) IV, senna-docusate  Assessment/ Plan:  [name] is a 65 y.o.  female who was admitted to [facility] on 11/28/2020 for AKI on CKD. Patient has previous medical conditions including diabetes, hypertension, CVA with left hemiplegia residual, morbid obesity and chronic kidney disease stage 3B. She presents to the ED with weakness and abnormal labs.    Acute Kidney Injury on chronic kidney disease stage 3B with baseline  creatinine 2.32 and GFR of 23 on 12/03/20.  Acute kidney injury secondary to ATN due to poor intake vs uncontrolled hypertension Chronic kidney disease is secondary to uncontrolled diabetes UOP 1550 ml for the preceding 24 hours ,  light yellow frothy urine draining via Pure [place]. No acute indication for dialysis, will continue monitoring renal function closely   Lab Results  Component Value Date   CREATININE 4.13 (H) 12/25/2020   CREATININE 4.07 (H) 12/24/2020   CREATININE 3.64 (H) 12/23/2020    Intake/Output Summary (Last 24 hours) at 12/25/2020 1542 Last data filed at 12/25/2020 1426 Gross per 24 hour  Intake 480 ml  Output 1400 ml  Net -920 ml     2. Hypertension:  BP 169/66  today Enalapril discontinued yesterday,due to worsening renal function   3. Diabetes mellitus type II with chronic kidney disease  Hgb A1c 8.6 on 11/29/20 Blood glucose 257 this morning She is on Insulin Aspart and Glargine  4. Anemia of CKD Lab Results  Component Value Date   HGB 7.4 (L) 12/25/2020   Will continue monitoring closely   LOS: 9 Susan House 6/11/20223:42 PM

## 2020-12-26 DIAGNOSIS — Z862 Personal history of diseases of the blood and blood-forming organs and certain disorders involving the immune mechanism: Secondary | ICD-10-CM

## 2020-12-26 DIAGNOSIS — N189 Chronic kidney disease, unspecified: Secondary | ICD-10-CM | POA: Diagnosis not present

## 2020-12-26 DIAGNOSIS — N179 Acute kidney failure, unspecified: Secondary | ICD-10-CM | POA: Diagnosis not present

## 2020-12-26 DIAGNOSIS — I69359 Hemiplegia and hemiparesis following cerebral infarction affecting unspecified side: Secondary | ICD-10-CM | POA: Diagnosis not present

## 2020-12-26 LAB — BASIC METABOLIC PANEL
Anion gap: 7 (ref 5–15)
BUN: 64 mg/dL — ABNORMAL HIGH (ref 8–23)
CO2: 22 mmol/L (ref 22–32)
Calcium: 8.4 mg/dL — ABNORMAL LOW (ref 8.9–10.3)
Chloride: 110 mmol/L (ref 98–111)
Creatinine, Ser: 3.86 mg/dL — ABNORMAL HIGH (ref 0.44–1.00)
GFR, Estimated: 12 mL/min — ABNORMAL LOW (ref >60)
Glucose, Bld: 138 mg/dL — ABNORMAL HIGH (ref 70–99)
Potassium: 4.8 mmol/L (ref 3.5–5.1)
Sodium: 139 mmol/L (ref 135–145)

## 2020-12-26 LAB — GLUCOSE, CAPILLARY
Glucose-Capillary: 132 mg/dL — ABNORMAL HIGH (ref 70–99)
Glucose-Capillary: 205 mg/dL — ABNORMAL HIGH (ref 70–99)
Glucose-Capillary: 133 mg/dL — ABNORMAL HIGH (ref 70–99)
Glucose-Capillary: 198 mg/dL — ABNORMAL HIGH (ref 70–99)

## 2020-12-26 LAB — CBC WITH DIFFERENTIAL/PLATELET
Abs Immature Granulocytes: 0.05 10*3/uL (ref 0.00–0.07)
Basophils Absolute: 0.1 10*3/uL (ref 0.0–0.1)
Basophils Relative: 1 %
Eosinophils Absolute: 0.3 10*3/uL (ref 0.0–0.5)
Eosinophils Relative: 5 %
HCT: 21 % — ABNORMAL LOW (ref 36.0–46.0)
Hemoglobin: 6.9 g/dL — ABNORMAL LOW (ref 12.0–15.0)
Immature Granulocytes: 1 %
Lymphocytes Relative: 25 %
Lymphs Abs: 1.5 10*3/uL (ref 0.7–4.0)
MCH: 28.5 pg (ref 26.0–34.0)
MCHC: 32.9 g/dL (ref 30.0–36.0)
MCV: 86.8 fL (ref 80.0–100.0)
Monocytes Absolute: 0.6 10*3/uL (ref 0.1–1.0)
Monocytes Relative: 9 %
Neutro Abs: 3.5 10*3/uL (ref 1.7–7.7)
Neutrophils Relative %: 59 %
Platelets: 242 10*3/uL (ref 150–400)
RBC: 2.42 MIL/uL — ABNORMAL LOW (ref 3.87–5.11)
RDW: 14.6 % (ref 11.5–15.5)
WBC: 5.9 10*3/uL (ref 4.0–10.5)
nRBC: 0 % (ref 0.0–0.2)

## 2020-12-26 LAB — PREPARE RBC (CROSSMATCH)

## 2020-12-26 MED ORDER — SODIUM CHLORIDE 0.9% IV SOLUTION: Freq: Once | INTRAVENOUS | Status: AC

## 2020-12-26 MED ORDER — METOPROLOL TARTRATE 25 MG PO TABS
25.0000 mg | ORAL_TABLET | Freq: Two times a day (BID) | ORAL | Status: DC
  Administered 2020-12-26 (×2): 25 mg via ORAL
  Filled 2020-12-26 (×2): qty 1

## 2020-12-26 MED ORDER — AMLODIPINE BESYLATE 5 MG PO TABS
5.0000 mg | ORAL_TABLET | Freq: Every day | ORAL | Status: DC
  Administered 2020-12-26: 5 mg via ORAL
  Filled 2020-12-26: qty 1

## 2020-12-26 NOTE — Progress Notes (Signed)
PROGRESS NOTE    Susan House  UVO:[id number] DOB: [birth year] DOA: 12/14/2020 PCP: Jerrol Banana., MD  Brief Narrative:   65 y.o. female with medical history significant for history of CVA with residual left-sided weakness, left frontal encephalomalacia, CKD stage IIIb-IV, insulin-dependent type 2 diabetes, hypertension, hyperlipidemia, seizure disorder, and memory loss who presents to the ED for evaluation of generalized weakness and abnormal labs.   Patient recently admitted 11/28/2020-12/03/2020 for AKI on CKD managed with IV fluid hydration.  Renal ultrasound showed increased echogenicity compatible with medical renal disease and a 2.7 cm right kidney cyst.  She was discharged to SNF.   Patient states that she was sent to the ED due to abnormal labs.  Found to have AKI on CKD and evidence of urinary tract infection.  Kidney function appears to have peaked at 3.44.  Now slightly downtrending.  Mental status also slowly improving.  Mental status has been waxing and waning.  Discussed this with the patient's daughter.  I feel this is a progression of underlying vascular dementia and patient if may be established a new psychiatric baseline.  Unfortunately blood pressure has been very difficult to control.  Intravenous fluids were discontinued on 6/5 and patient had subsequent deterioration kidney function 6/6.  Nephrology consult requested for recommendations.   Assessment & Plan:   Principal Problem:   Acute kidney injury superimposed on chronic kidney disease ([place]) Active Problems:   Hypertension associated with diabetes ([place])   Type 2 diabetes mellitus with complications ([facility])   Hemiparesis due to old cerebrovascular accident ([facility])   Hyperlipidemia associated with type 2 diabetes mellitus ([place])   Hx of completed stroke   AKI (acute kidney injury) ([place])   AKI on CKD stage IIIb Recently admitted for the same.  Renal function improved today Similar to previous  presentation likely etiology is ATN in the setting of poorly controlled hypertension as well as possible relative dehydration/weight depletion Creat: 3.45-> 4.13->3.86 Nephrology following.   Avoid NSAIDs and nonessential nephrotoxins Renal ultrasound with Doppler rule showed 10.8 x 5.6 cm cystic mass on the right side.  CT abdomen pelvis did not show this mass.  Showed some colitis but she is not having any symptoms of colitis so we will hold off treatment.  Also diverticulosis but no diverticulitis.  Refractory hypertension  Resume amlodipine, metoprolol, Imdur. Holding Vasotec due to low blood pressure on 6/11 Continue clonidine, hydralazine at current doses.    Urinary tract infection Completed 3 days of Rocephin.  Anemia of chronic kidney disease and B12 deficiency Hemoglobin 6.9 - will order 1 PRBC for transfusion.  Patient denies any obvious bleeding.  Continue B12 supplement and monitor for any signs/symptoms of bleeding. Patient agreeable with transfusion.   Insulin-dependent type 2 diabetes A1c 8.6%.  Reduce Lantus to 10 units nightly and continue SSI.   History of CVA with residual left-sided weakness/left frontal encephalomalacia Continue aspirin, Plavix, atorvastatin.   Seizure disorder Continue Keppra Seizure precaution  Memory loss Suspected underlying vascular dementia.   She had some reported confusion/delirium prior to arrival.   Baseline level of mentation is unclear CT head significant for chronic volume loss and small vessel ischemic changes consistent with suspected vascular dementia Outpatient follow-up  Constipation Continue Senokot-S and MiraLAX  DVT prophylaxis: SQ heparin Code Status: DNR Family Communication:  Daughter Candace updated over phone [phone] on 6/9 Disposition Plan: Status is: Inpatient  Remains inpatient appropriate because:Inpatient level of care appropriate due to severity of illness  Dispo: The patient is from: SNF               Anticipated d/c is to: SNF              Patient currently is not medically stable to d/c.   Difficult to place patient No   Kidney function remains poor and needs improvement before D/C   Level of care: Med-Surg  Consultants:  Nephrology  Procedures:  None  Antimicrobials:  Ceftriaxone completed   Subjective: No new issues. Hb < 7 and getting transfusion today  Objective: Vitals:   12/25/20 1934 12/26/20 0005 12/26/20 0523 12/26/20 0825  BP: (!) 126/55 (!) 119/44 (!) 151/50 (!) 159/59  Pulse: (!) 59 60 (!) 58 64  Resp: 16 16 18 16   Temp: (!) 97.5 F (36.4 C) 97.7 F (36.5 C) 97.9 F (36.6 C) 97.9 F (36.6 C)  TempSrc: Oral Oral Oral Oral  SpO2: 99% 98% 99% 100%  Weight:      Height:        Intake/Output Summary (Last 24 hours) at 12/26/2020 1040 Last data filed at 12/26/2020 1027 Gross per 24 hour  Intake 1080 ml  Output 1225 ml  Net -145 ml   Filed Weights   12/20/20 0500 12/21/20 0606 12/24/20 0433  Weight: 92.3 kg 92.5 kg 90.3 kg    Examination:  General exam: Mild distress.  Appears fatigued Respiratory system: Lungs clear.  Normal work of breathing.  Room air Cardiovascular system: S1 & S2 heard, RRR. No JVD, murmurs, rubs, gallops or clicks.  No pitting edema. Gastrointestinal system: Abdomen is nondistended, soft and nontender. No organomegaly or masses felt. Normal bowel sounds heard. Central nervous system: Alert and oriented x2.  No focal deficits Extremities: Symmetric 5 x 5 power. Skin: No rashes, lesions or ulcers Psychiatry: Judgement and insight appear impaired. Mood & affect flattened.     Data Reviewed: I have personally reviewed following labs and imaging studies  CBC: Recent Labs  Lab 12/22/20 0420 12/23/20 0358 12/24/20 0509 12/25/20 0359 12/26/20 0426  WBC 8.7 16.7* 8.0 6.8 5.9  NEUTROABS 5.5 14.5* 5.5 4.5 3.5  HGB 7.7* 7.7* 7.2* 7.4* 6.9*  HCT 23.8* 23.6* 21.8* 22.9* 21.0*  MCV 87.2 88.4 86.9 88.4 86.8  PLT 280  272 232 241 160   Basic Metabolic Panel: Recent Labs  Lab 12/22/20 0420 12/23/20 0358 12/24/20 0509 12/25/20 0359 12/26/20 0426  NA 138 137 134* 134* 139  K 4.0 4.2 4.3 4.3 4.8  CL 106 107 103 104 110  CO2 21* 21* 20* 20* 22  GLUCOSE 151* 177* 197* 257* 138*  BUN 55* 58* 64* 65* 64*  CREATININE 3.45* 3.64* 4.07* 4.13* 3.86*  CALCIUM 8.6* 8.4* 8.1* 8.5* 8.4*   GFR: Estimated Creatinine Clearance: 15.4 mL/min (A) (by C-G formula based on SCr of 3.86 mg/dL (H)). Liver Function Tests: No results for input(s): AST, ALT, ALKPHOS, BILITOT, PROT, ALBUMIN in the last 168 hours. No results for input(s): LIPASE, AMYLASE in the last 168 hours. No results for input(s): AMMONIA in the last 168 hours. Coagulation Profile: No results for input(s): INR, PROTIME in the last 168 hours. Cardiac Enzymes: No results for input(s): CKTOTAL, CKMB, CKMBINDEX, TROPONINI in the last 168 hours. BNP (last 3 results) No results for input(s): PROBNP in the last 8760 hours. HbA1C: No results for input(s): HGBA1C in the last 72 hours. CBG: Recent Labs  Lab 12/25/20 0715 12/25/20 1153 12/25/20 1606 12/25/20 2122 12/26/20 0720  GLUCAP 210* 217*  207* 161* 132*   Lipid Profile: No results for input(s): CHOL, HDL, LDLCALC, TRIG, CHOLHDL, LDLDIRECT in the last 72 hours. Thyroid Function Tests: No results for input(s): TSH, T4TOTAL, FREET4, T3FREE, THYROIDAB in the last 72 hours. Anemia Panel: No results for input(s): VITAMINB12, FOLATE, FERRITIN, TIBC, IRON, RETICCTPCT in the last 72 hours. Sepsis Labs: No results for input(s): PROCALCITON, LATICACIDVEN in the last 168 hours.  Recent Results (from the past 240 hour(s))  SARS CORONAVIRUS 2 (TAT 6-24 HRS) Nasopharyngeal Nasopharyngeal Swab     Status: None   Collection Time: 12/19/20  4:40 PM   Specimen: Nasopharyngeal Swab  Result Value Ref Range Status   SARS Coronavirus 2 NEGATIVE NEGATIVE Final    Comment: (NOTE) SARS-CoV-2 target nucleic acids  are NOT DETECTED.  The SARS-CoV-2 RNA is generally detectable in upper and lower respiratory specimens during the acute phase of infection. Negative results do not preclude SARS-CoV-2 infection, do not rule out co-infections with other pathogens, and should not be used as the sole basis for treatment or other patient management decisions. Negative results must be combined with clinical observations, patient history, and epidemiological information. The expected result is Negative.  Fact Sheet for Patients: SugarRoll.be  Fact Sheet for Healthcare Providers: https://www.woods-mathews.com/  This test is not yet approved or cleared by the [place] FDA and  has been authorized for detection and/or diagnosis of SARS-CoV-2 by FDA under an Emergency Use Authorization (EUA). This EUA will remain  in effect (meaning this test can be used) for the duration of the COVID-19 declaration under Se ction 564(b)(1) of the Act, 21 U.S.C. section 360bbb-3(b)(1), unless the authorization is terminated or revoked sooner.  Performed at [facility], [place] [address] [place], [place] [address]          Radiology Studies: No results found.       Scheduled Meds:  sodium chloride   Intravenous Once   aspirin  81 mg Oral Daily   atorvastatin  20 mg Oral QHS   cloNIDine  0.3 mg Oral BID   clopidogrel  75 mg Oral Daily   gabapentin  200 mg Oral BID   heparin  5,000 Units Subcutaneous Q8H   hydrALAZINE  100 mg Oral TID   insulin aspart  0-15 Units Subcutaneous TID WC   insulin glargine  10 Units Subcutaneous QHS   isosorbide mononitrate  60 mg Oral Daily   levETIRAcetam  500 mg Oral Q24H   nystatin  1 application Topical BID   pantoprazole  40 mg Oral Daily   polyethylene glycol  17 g Oral Daily   senna-docusate  2 tablet Oral BID   vitamin B-12  250 mcg Oral Daily   Continuous Infusions:  sodium chloride       LOS: 10 days     Time spent: 25 minutes    Max Sane, MD [facility] Pager [phone]  If 7PM-7AM, please contact night-coverage 12/26/2020, 10:40 AM

## 2020-12-26 NOTE — Plan of Care (Signed)

## 2020-12-26 NOTE — Progress Notes (Signed)
[facility]  ROUNDING NOTE   Subjective:   Susan House is a 65 y.o. female with medical conditions including CVA with left sided weakness, Type 2 diabetes, hyperlipidemia, seizures, hypertension and CKD 3b. Patient presents ED with weakness and abnormal labs.   Patient in no acute distress,she denies anorexia, nausea, vomiting, and SOB, continues to have adequate urine output.    Objective:  Vital signs in last 24 hours:  Temp:  [97.4 F (36.3 C)-98.8 F (37.1 C)] 98.1 F (36.7 C) (06/12 1440) Pulse Rate:  [58-67] 59 (06/12 1440) Resp:  [16-18] 16 (06/12 1440) BP: (119-159)/(44-59) 130/54 (06/12 1440) SpO2:  [96 %-100 %] 100 % (06/12 1440)  Weight change:  Filed Weights   12/20/20 0500 12/21/20 0606 12/24/20 0433  Weight: 92.3 kg 92.5 kg 90.3 kg    Intake/Output: I/O last 3 completed shifts: In: 1080 [P.O.:1080] Out: 2125 [Urine:2125]   Intake/Output this shift:  Total I/O In: 962 [P.O.:600; Blood:362] Out: -   Physical Exam: General: Resting in bed, consuming lunch  Head: Normocephalic, atraumatic.moist oral mucosal membranes  Eyes: Anicteric  Lungs:  Clear bilaterally, normal respiratory effort  Heart: S1S2,no rubs or gallops  Abdomen:  Soft, nontender, non distended  Extremities:  no peripheral edema.  Neurologic:  left sided weakness +, Oriented x 3  Skin: No acute lesions or rashes       Basic Metabolic Panel: Recent Labs  Lab 12/22/20 0420 12/23/20 0358 12/24/20 0509 12/25/20 0359 12/26/20 0426  NA 138 137 134* 134* 139  K 4.0 4.2 4.3 4.3 4.8  CL 106 107 103 104 110  CO2 21* 21* 20* 20* 22  GLUCOSE 151* 177* 197* 257* 138*  BUN 55* 58* 64* 65* 64*  CREATININE 3.45* 3.64* 4.07* 4.13* 3.86*  CALCIUM 8.6* 8.4* 8.1* 8.5* 8.4*     Liver Function Tests: No results for input(s): AST, ALT, ALKPHOS, BILITOT, PROT, ALBUMIN in the last 168 hours. No results for input(s): LIPASE, AMYLASE in the last 168 hours. No results for  input(s): AMMONIA in the last 168 hours.  CBC: Recent Labs  Lab 12/22/20 0420 12/23/20 0358 12/24/20 0509 12/25/20 0359 12/26/20 0426  WBC 8.7 16.7* 8.0 6.8 5.9  NEUTROABS 5.5 14.5* 5.5 4.5 3.5  HGB 7.7* 7.7* 7.2* 7.4* 6.9*  HCT 23.8* 23.6* 21.8* 22.9* 21.0*  MCV 87.2 88.4 86.9 88.4 86.8  PLT 280 272 232 241 242     Cardiac Enzymes: No results for input(s): CKTOTAL, CKMB, CKMBINDEX, TROPONINI in the last 168 hours.  BNP: Invalid input(s): POCBNP  CBG: Recent Labs  Lab 12/25/20 1153 12/25/20 1606 12/25/20 2122 12/26/20 0720 12/26/20 1155  GLUCAP 217* 207* 161* 132* 205*     Microbiology: Results for orders placed or performed during the hospital encounter of 12/14/20  SARS CORONAVIRUS 2 (TAT 6-24 HRS) Nasopharyngeal Nasopharyngeal Swab     Status: None   Collection Time: 12/14/20  8:28 PM   Specimen: Nasopharyngeal Swab  Result Value Ref Range Status   SARS Coronavirus 2 NEGATIVE NEGATIVE Final    Comment: (NOTE) SARS-CoV-2 target nucleic acids are NOT DETECTED.  The SARS-CoV-2 RNA is generally detectable in upper and lower respiratory specimens during the acute phase of infection. Negative results do not preclude SARS-CoV-2 infection, do not rule out co-infections with other pathogens, and should not be used as the sole basis for treatment or other patient management decisions. Negative results must be combined with clinical observations, patient history, and epidemiological information. The expected result  is Negative.  Fact Sheet for Patients: SugarRoll.be  Fact Sheet for Healthcare Providers: https://www.woods-mathews.com/  This test is not yet approved or cleared by the [place] FDA and  has been authorized for detection and/or diagnosis of SARS-CoV-2 by FDA under an Emergency Use Authorization (EUA). This EUA will remain  in effect (meaning this test can be used) for the duration of the COVID-19  declaration under Se ction 564(b)(1) of the Act, 21 U.S.C. section 360bbb-3(b)(1), unless the authorization is terminated or revoked sooner.  Performed at [facility], [place] [address], [place], [place] [address]   SARS CORONAVIRUS 2 (TAT 6-24 HRS) Nasopharyngeal Nasopharyngeal Swab     Status: None   Collection Time: 12/19/20  4:40 PM   Specimen: Nasopharyngeal Swab  Result Value Ref Range Status   SARS Coronavirus 2 NEGATIVE NEGATIVE Final    Comment: (NOTE) SARS-CoV-2 target nucleic acids are NOT DETECTED.  The SARS-CoV-2 RNA is generally detectable in upper and lower respiratory specimens during the acute phase of infection. Negative results do not preclude SARS-CoV-2 infection, do not rule out co-infections with other pathogens, and should not be used as the sole basis for treatment or other patient management decisions. Negative results must be combined with clinical observations, patient history, and epidemiological information. The expected result is Negative.  Fact Sheet for Patients: SugarRoll.be  Fact Sheet for Healthcare Providers: https://www.woods-mathews.com/  This test is not yet approved or cleared by the [place] FDA and  has been authorized for detection and/or diagnosis of SARS-CoV-2 by FDA under an Emergency Use Authorization (EUA). This EUA will remain  in effect (meaning this test can be used) for the duration of the COVID-19 declaration under Se ction 564(b)(1) of the Act, 21 U.S.C. section 360bbb-3(b)(1), unless the authorization is terminated or revoked sooner.  Performed at [facility], [place] [address], [place], [place] [address]     Coagulation Studies: No results for input(s): LABPROT, INR in the last 72 hours.  Urinalysis: No results for input(s): COLORURINE, LABSPEC, PHURINE, GLUCOSEU, HGBUR, BILIRUBINUR, KETONESUR, PROTEINUR, UROBILINOGEN, NITRITE, LEUKOCYTESUR in the last 72  hours.  Invalid input(s): APPERANCEUR    Imaging: No results found.   Medications:    sodium chloride      amLODipine  5 mg Oral Daily   aspirin  81 mg Oral Daily   atorvastatin  20 mg Oral QHS   cloNIDine  0.3 mg Oral BID   clopidogrel  75 mg Oral Daily   gabapentin  200 mg Oral BID   heparin  5,000 Units Subcutaneous Q8H   hydrALAZINE  100 mg Oral TID   insulin aspart  0-15 Units Subcutaneous TID WC   insulin glargine  10 Units Subcutaneous QHS   isosorbide mononitrate  60 mg Oral Daily   levETIRAcetam  500 mg Oral Q24H   metoprolol tartrate  25 mg Oral BID   nystatin  1 application Topical BID   pantoprazole  40 mg Oral Daily   polyethylene glycol  17 g Oral Daily   senna-docusate  2 tablet Oral BID   vitamin B-12  250 mcg Oral Daily   acetaminophen **OR** acetaminophen, HYDROcodone-acetaminophen, hydrocortisone, iohexol, ondansetron **OR** ondansetron (ZOFRAN) IV, senna-docusate  Assessment/ Plan:  [name] is a 65 y.o.  female who was admitted to [facility] on 11/28/2020 for AKI on CKD. Patient has previous medical conditions including diabetes, hypertension, CVA with left hemiplegia residual, morbid obesity and chronic kidney disease stage 3B. She  presents to the ED with weakness and abnormal labs.    Acute Kidney Injury on chronic kidney disease stage 3B with baseline creatinine 2.32 and GFR of 23 on 12/03/20.  Acute kidney injury secondary to ATN due to poor intake vs uncontrolled hypertension Chronic kidney disease is secondary to uncontrolled diabetes  Renal function improving UOP 1225 ml for the preceding 24 hours , draining via [place]. will continue monitoring renal function closely   Lab Results  Component Value Date   CREATININE 3.86 (H) 12/26/2020   CREATININE 4.13 (H) 12/25/2020   CREATININE 4.07 (H) 12/24/2020    Intake/Output Summary (Last 24 hours) at 12/26/2020 1450 Last data filed at 12/26/2020 1440 Gross per 24 hour  Intake 1322  ml  Output 725 ml  Net 597 ml     2. Hypertension:  Blood pressure readings within acceptable range   3. Diabetes mellitus type II with chronic kidney disease Hgb A1c 8.6 on 11/29/20 Continue current insulin regimen per primary team  4. Anemia of CKD Lab Results  Component Value Date   HGB 6.9 (L) 12/26/2020   Receiving PRBC today   LOS: [place] 6/12/20222:50 PM

## 2020-12-27 DIAGNOSIS — Z8673 Personal history of transient ischemic attack (TIA), and cerebral infarction without residual deficits: Secondary | ICD-10-CM | POA: Diagnosis not present

## 2020-12-27 DIAGNOSIS — N179 Acute kidney failure, unspecified: Secondary | ICD-10-CM | POA: Diagnosis not present

## 2020-12-27 DIAGNOSIS — N189 Chronic kidney disease, unspecified: Secondary | ICD-10-CM | POA: Diagnosis not present

## 2020-12-27 DIAGNOSIS — I69359 Hemiplegia and hemiparesis following cerebral infarction affecting unspecified side: Secondary | ICD-10-CM | POA: Diagnosis not present

## 2020-12-27 LAB — TYPE AND SCREEN
ABO/RH(D): B POS
Antibody Screen: NEGATIVE
Unit division: 0

## 2020-12-27 LAB — BASIC METABOLIC PANEL
Anion gap: 9 (ref 5–15)
BUN: 74 mg/dL — ABNORMAL HIGH (ref 8–23)
CO2: 22 mmol/L (ref 22–32)
Calcium: 8.3 mg/dL — ABNORMAL LOW (ref 8.9–10.3)
Chloride: 108 mmol/L (ref 98–111)
Creatinine, Ser: 4.27 mg/dL — ABNORMAL HIGH (ref 0.44–1.00)
GFR, Estimated: 11 mL/min — ABNORMAL LOW (ref >60)
Glucose, Bld: 186 mg/dL — ABNORMAL HIGH (ref 70–99)
Potassium: 4.9 mmol/L (ref 3.5–5.1)
Sodium: 139 mmol/L (ref 135–145)

## 2020-12-27 LAB — CBC WITH DIFFERENTIAL/PLATELET
Abs Immature Granulocytes: 0.07 10*3/uL (ref 0.00–0.07)
Basophils Absolute: 0.1 10*3/uL (ref 0.0–0.1)
Basophils Relative: 1 %
Eosinophils Absolute: 0.3 10*3/uL (ref 0.0–0.5)
Eosinophils Relative: 4 %
HCT: 26.3 % — ABNORMAL LOW (ref 36.0–46.0)
Hemoglobin: 8.8 g/dL — ABNORMAL LOW (ref 12.0–15.0)
Immature Granulocytes: 1 %
Lymphocytes Relative: 18 %
Lymphs Abs: 1.3 10*3/uL (ref 0.7–4.0)
MCH: 28.9 pg (ref 26.0–34.0)
MCHC: 33.5 g/dL (ref 30.0–36.0)
MCV: 86.5 fL (ref 80.0–100.0)
Monocytes Absolute: 0.6 10*3/uL (ref 0.1–1.0)
Monocytes Relative: 8 %
Neutro Abs: 4.9 10*3/uL (ref 1.7–7.7)
Neutrophils Relative %: 68 %
Platelets: 242 10*3/uL (ref 150–400)
RBC: 3.04 MIL/uL — ABNORMAL LOW (ref 3.87–5.11)
RDW: 14.8 % (ref 11.5–15.5)
WBC: 7.2 10*3/uL (ref 4.0–10.5)
nRBC: 0 % (ref 0.0–0.2)

## 2020-12-27 LAB — GLUCOSE, CAPILLARY
Glucose-Capillary: 168 mg/dL — ABNORMAL HIGH (ref 70–99)
Glucose-Capillary: 151 mg/dL — ABNORMAL HIGH (ref 70–99)
Glucose-Capillary: 185 mg/dL — ABNORMAL HIGH (ref 70–99)
Glucose-Capillary: 189 mg/dL — ABNORMAL HIGH (ref 70–99)

## 2020-12-27 LAB — BPAM RBC
Blood Product Expiration Date: 202206192359
ISSUE DATE / TIME: 202206121130
Unit Type and Rh: 7300

## 2020-12-27 MED ORDER — AMLODIPINE BESYLATE 10 MG PO TABS
10.0000 mg | ORAL_TABLET | Freq: Every day | ORAL | Status: DC
  Administered 2020-12-27 – 2021-01-19 (×22): 10 mg via ORAL
  Filled 2020-12-27 (×23): qty 1

## 2020-12-27 MED ORDER — METOPROLOL TARTRATE 50 MG PO TABS
50.0000 mg | ORAL_TABLET | Freq: Two times a day (BID) | ORAL | Status: DC
  Administered 2020-12-27 – 2021-01-19 (×40): 50 mg via ORAL
  Filled 2020-12-27 (×44): qty 1

## 2020-12-27 MED ORDER — SODIUM CHLORIDE 0.9 % IV SOLN: INTRAVENOUS | Status: DC

## 2020-12-27 MED ORDER — GABAPENTIN 100 MG PO CAPS
100.0000 mg | ORAL_CAPSULE | Freq: Two times a day (BID) | ORAL | Status: DC
  Administered 2020-12-27 – 2020-12-30 (×7): 100 mg via ORAL
  Filled 2020-12-27 (×7): qty 1

## 2020-12-27 MED ORDER — HYDRALAZINE HCL 50 MG PO TABS
100.0000 mg | ORAL_TABLET | Freq: Four times a day (QID) | ORAL | Status: DC
  Administered 2020-12-27 – 2021-01-19 (×71): 100 mg via ORAL
  Filled 2020-12-27 (×77): qty 2

## 2020-12-27 NOTE — Progress Notes (Signed)
OT Cancellation Note  Patient Details Name: Lila Lufkin MRN: [id number] DOB: [birth year]   Cancelled Treatment:    Reason Eval/Treat Not Completed: Fatigue/lethargy limiting ability to participate  Attempted to engage pt in OT tx, but pt asleep.  Pt did not wake to multiple verbal cues.  Will continue to follow up at next opportunity.  Jeneen Montgomery, OTR/L 12/27/20, 3:23 PM

## 2020-12-27 NOTE — Plan of Care (Signed)

## 2020-12-27 NOTE — Progress Notes (Addendum)
PROGRESS NOTE    Susan House  IWL:[id number] DOB: April 04, 1956 DOA: 12/14/2020 PCP: Jerrol Banana., MD  Brief Narrative:   65 y.o. female with medical history significant for history of CVA with residual left-sided weakness, left frontal encephalomalacia, CKD stage IIIb-IV, insulin-dependent type 2 diabetes, hypertension, hyperlipidemia, seizure disorder, and memory loss who presents to the ED for evaluation of generalized weakness and abnormal labs.   Patient recently admitted 11/28/2020-12/03/2020 for AKI on CKD managed with IV fluid hydration.  Renal ultrasound showed increased echogenicity compatible with medical renal disease and a 2.7 cm right kidney cyst.  She was discharged to SNF.   Patient states that she was sent to the ED due to abnormal labs.  Found to have AKI on CKD and evidence of urinary tract infection.  Kidney function appears to have peaked at 3.44.  Now slightly downtrending.  Mental status also slowly improving.  Mental status has been waxing and waning.  Discussed this with the patient's daughter.  I feel this is a progression of underlying vascular dementia and patient if may be established a new psychiatric baseline.  Unfortunately blood pressure has been very difficult to control.  Intravenous fluids were discontinued on 6/5 and patient had subsequent deterioration kidney function 6/6.  Nephrology following.  6/7-6/13: Renal function continues to get worse although still making urine so nephrology not ready for dialysis yet but not able to discharge either as her kidney function has not returned to baseline.  She required 1 PRBC transfusion on 6/12 for hemoglobin of 6.9 with appropriate response   Assessment & Plan:   Principal Problem:   Acute kidney injury superimposed on chronic kidney disease ([place]) Active Problems:   Hypertension associated with diabetes ([place])   Type 2 diabetes mellitus with complications ([facility])   Hemiparesis due to old  cerebrovascular accident ([place])   Hyperlipidemia associated with type 2 diabetes mellitus ([place])   Hx of completed stroke   AKI (acute kidney injury) ([place])   AKI on CKD stage IIIb Recently admitted for the same.  Renal function worsened today Similar to previous presentation likely etiology is ATN in the setting of poorly controlled hypertension as well as possible relative dehydration/weight depletion Creat: 3.45-> 4.13->3.86-> 4.27 Nephrology following.   Avoid NSAIDs and nonessential nephrotoxins Renal ultrasound with Doppler rule showed 10.8 x 5.6 cm cystic mass on the right side.  CT abdomen pelvis did not show this mass.  Showed some colitis but she is not having any symptoms of colitis so we will hold off treatment.  Also diverticulosis but no diverticulitis.  Refractory hypertension  Continue amlodipine, clonidine, hydralazine metoprolol and Imdur. Holding Vasotec per nephrology  Urinary tract infection Completed 3 days of Rocephin.  Anemia of chronic kidney disease and B12 deficiency Hemoglobin 6.9 on 6/13 - s/p 1 PRBC for transfusion and Hb 8.8  Patient denies any obvious bleeding.  Continue B12 supplement and monitor for any signs/symptoms of bleeding.    Insulin-dependent type 2 diabetes A1c 8.6%.  Reduce Lantus to 10 units nightly and continue SSI.   History of CVA with residual left-sided weakness/left frontal encephalomalacia Continue aspirin, Plavix, atorvastatin.   Seizure disorder Continue Keppra Seizure precaution  Memory loss Suspected underlying vascular dementia.   She had some reported confusion/delirium prior to arrival.   Baseline level of mentation is unclear CT head significant for chronic volume loss and small vessel ischemic changes consistent with suspected vascular dementia Outpatient follow-up  Constipation Continue Senokot-S and MiraLAX  DVT  prophylaxis: SQ heparin Code Status: DNR Family Communication:  Daughter Candace updated over phone  [phone] on 6/13 Disposition Plan: Status is: Inpatient  Remains inpatient appropriate because:Inpatient level of care appropriate due to severity of illness   Dispo: The patient is from: SNF              Anticipated d/c is to: SNF              Patient currently is not medically stable to d/c.   Difficult to place patient No   Kidney function remains poor and needs improvement before D/C   Level of care: Med-Surg  Consultants:  Nephrology  Procedures:  None  Antimicrobials:  Ceftriaxone completed   Subjective: Kidney function not quite stabilized/improving.  Patient is frustrated and wants to go home  Objective: Vitals:   12/27/20 0414 12/27/20 0501 12/27/20 0741 12/27/20 1109  BP: (!) 173/56  (!) 195/66 (!) 144/57  Pulse: 69  75 70  Resp: 16  15 15   Temp: 98.4 F (36.9 C)  99 F (37.2 C) 97.9 F (36.6 C)  TempSrc: Oral  Oral Oral  SpO2: 98%  98% 99%  Weight:  91.1 kg    Height:        Intake/Output Summary (Last 24 hours) at 12/27/2020 1316 Last data filed at 12/27/2020 1015 Gross per 24 hour  Intake 962 ml  Output 2300 ml  Net -1338 ml   Filed Weights   12/21/20 0606 12/24/20 0433 12/27/20 0501  Weight: 92.5 kg 90.3 kg 91.1 kg    Examination:  General exam: Mild distress.  Appears fatigued Respiratory system: Lungs clear.  Normal work of breathing.  Room air Cardiovascular system: S1 & S2 heard, RRR. No JVD, murmurs, rubs, gallops or clicks.  No pitting edema. Gastrointestinal system: Abdomen is nondistended, soft and nontender. No organomegaly or masses felt. Normal bowel sounds heard. Central nervous system: Alert and oriented x2.  No focal deficits Extremities: Symmetric 5 x 5 power. Skin: No rashes, lesions or ulcers Psychiatry: Judgement and insight appear impaired. Mood & affect flattened.     Data Reviewed: I have personally reviewed following labs and imaging studies  CBC: Recent Labs  Lab 12/23/20 0358 12/24/20 0509  12/25/20 0359 12/26/20 0426 12/27/20 0359  WBC 16.7* 8.0 6.8 5.9 7.2  NEUTROABS 14.5* 5.5 4.5 3.5 4.9  HGB 7.7* 7.2* 7.4* 6.9* 8.8*  HCT 23.6* 21.8* 22.9* 21.0* 26.3*  MCV 88.4 86.9 88.4 86.8 86.5  PLT 272 232 241 242 326   Basic Metabolic Panel: Recent Labs  Lab 12/23/20 0358 12/24/20 0509 12/25/20 0359 12/26/20 0426 12/27/20 0359  NA 137 134* 134* 139 139  K 4.2 4.3 4.3 4.8 4.9  CL 107 103 104 110 108  CO2 21* 20* 20* 22 22  GLUCOSE 177* 197* 257* 138* 186*  BUN 58* 64* 65* 64* 74*  CREATININE 3.64* 4.07* 4.13* 3.86* 4.27*  CALCIUM 8.4* 8.1* 8.5* 8.4* 8.3*   GFR: Estimated Creatinine Clearance: 14 mL/min (A) (by C-G formula based on SCr of 4.27 mg/dL (H)). Liver Function Tests: No results for input(s): AST, ALT, ALKPHOS, BILITOT, PROT, ALBUMIN in the last 168 hours. No results for input(s): LIPASE, AMYLASE in the last 168 hours. No results for input(s): AMMONIA in the last 168 hours. Coagulation Profile: No results for input(s): INR, PROTIME in the last 168 hours. Cardiac Enzymes: No results for input(s): CKTOTAL, CKMB, CKMBINDEX, TROPONINI in the last 168 hours. BNP (last 3 results) No results for input(s):  PROBNP in the last 8760 hours. HbA1C: No results for input(s): HGBA1C in the last 72 hours. CBG: Recent Labs  Lab 12/26/20 1155 12/26/20 1617 12/26/20 2101 12/27/20 0753 12/27/20 1134  GLUCAP 205* 133* 198* 168* 151*   Lipid Profile: No results for input(s): CHOL, HDL, LDLCALC, TRIG, CHOLHDL, LDLDIRECT in the last 72 hours. Thyroid Function Tests: No results for input(s): TSH, T4TOTAL, FREET4, T3FREE, THYROIDAB in the last 72 hours. Anemia Panel: No results for input(s): VITAMINB12, FOLATE, FERRITIN, TIBC, IRON, RETICCTPCT in the last 72 hours. Sepsis Labs: No results for input(s): PROCALCITON, LATICACIDVEN in the last 168 hours.  Recent Results (from the past 240 hour(s))  SARS CORONAVIRUS 2 (TAT 6-24 HRS) Nasopharyngeal Nasopharyngeal Swab      Status: None   Collection Time: 12/19/20  4:40 PM   Specimen: Nasopharyngeal Swab  Result Value Ref Range Status   SARS Coronavirus 2 NEGATIVE NEGATIVE Final    Comment: (NOTE) SARS-CoV-2 target nucleic acids are NOT DETECTED.  The SARS-CoV-2 RNA is generally detectable in upper and lower respiratory specimens during the acute phase of infection. Negative results do not preclude SARS-CoV-2 infection, do not rule out co-infections with other pathogens, and should not be used as the sole basis for treatment or other patient management decisions. Negative results must be combined with clinical observations, patient history, and epidemiological information. The expected result is Negative.  Fact Sheet for Patients: SugarRoll.be  Fact Sheet for Healthcare Providers: https://www.woods-mathews.com/  This test is not yet approved or cleared by the [place] FDA and  has been authorized for detection and/or diagnosis of SARS-CoV-2 by FDA under an Emergency Use Authorization (EUA). This EUA will remain  in effect (meaning this test can be used) for the duration of the COVID-19 declaration under Se ction 564(b)(1) of the Act, 21 U.S.C. section 360bbb-3(b)(1), unless the authorization is terminated or revoked sooner.  Performed at [facility], [place] [address] [place],  [address]          Radiology Studies: No results found.       Scheduled Meds:  amLODipine  10 mg Oral Daily   aspirin  81 mg Oral Daily   atorvastatin  20 mg Oral QHS   cloNIDine  0.3 mg Oral BID   clopidogrel  75 mg Oral Daily   gabapentin  100 mg Oral BID   heparin  5,000 Units Subcutaneous Q8H   hydrALAZINE  100 mg Oral Q6H   insulin aspart  0-15 Units Subcutaneous TID WC   insulin glargine  10 Units Subcutaneous QHS   isosorbide mononitrate  60 mg Oral Daily   levETIRAcetam  500 mg Oral Q24H   metoprolol tartrate  50 mg Oral BID   nystatin   1 application Topical BID   pantoprazole  40 mg Oral Daily   polyethylene glycol  17 g Oral Daily   senna-docusate  2 tablet Oral BID   vitamin B-12  250 mcg Oral Daily   Continuous Infusions:  sodium chloride 50 mL/hr at 12/27/20 8333   sodium chloride       LOS: 11 days    Time spent: 25 minutes    Max Sane, MD [facility] Pager [phone]  If 7PM-7AM, please contact night-coverage 12/27/2020, 1:16 PM

## 2020-12-27 NOTE — Progress Notes (Signed)
[facility]  ROUNDING NOTE   Subjective:   Susan House is a 65 y.o. female with medical conditions including CVA with left sided weakness, Type 2 diabetes, hyperlipidemia, seizures, hypertension and CKD 3b. Patient presents ED with weakness and abnormal labs.   Patient sitting up in bed eating breakfast Alert, but confused Denies nausea and diarrhea Denies shortness of breath   Objective:  Vital signs in last 24 hours:  Temp:  [97.4 F (36.3 C)-99 F (37.2 C)] 97.9 F (36.6 C) (06/13 1109) Pulse Rate:  [59-75] 70 (06/13 1109) Resp:  [15-20] 15 (06/13 1109) BP: (130-195)/(49-66) 144/57 (06/13 1109) SpO2:  [97 %-100 %] 99 % (06/13 1109) Weight:  [91.1 kg] 91.1 kg (06/13 0501)  Weight change:  Filed Weights   12/21/20 0606 12/24/20 0433 12/27/20 0501  Weight: 92.5 kg 90.3 kg 91.1 kg    Intake/Output: I/O last 3 completed shifts: In: 1202 [P.O.:840; Blood:362] Out: 1300 [Urine:1300]   Intake/Output this shift:  Total I/O In: -  Out: 1600 [Urine:1600]  Physical Exam: General: Resting in bed, eating lunch  Head: Normocephalic, atraumatic.moist oral mucosal membranes  Eyes: Anicteric  Lungs:  Clear bilaterally, normal respiratory effort  Heart: S1S2,no rubs or gallops  Abdomen:  Soft, nontender, non distended  Extremities:  no peripheral edema.  Neurologic:  left sided weakness +, Oriented x 1  Skin: No acute lesions or rashes       Basic Metabolic Panel: Recent Labs  Lab 12/23/20 0358 12/24/20 0509 12/25/20 0359 12/26/20 0426 12/27/20 0359  NA 137 134* 134* 139 139  K 4.2 4.3 4.3 4.8 4.9  CL 107 103 104 110 108  CO2 21* 20* 20* 22 22  GLUCOSE 177* 197* 257* 138* 186*  BUN 58* 64* 65* 64* 74*  CREATININE 3.64* 4.07* 4.13* 3.86* 4.27*  CALCIUM 8.4* 8.1* 8.5* 8.4* 8.3*     Liver Function Tests: No results for input(s): AST, ALT, ALKPHOS, BILITOT, PROT, ALBUMIN in the last 168 hours. No results for input(s): LIPASE, AMYLASE in  the last 168 hours. No results for input(s): AMMONIA in the last 168 hours.  CBC: Recent Labs  Lab 12/23/20 0358 12/24/20 0509 12/25/20 0359 12/26/20 0426 12/27/20 0359  WBC 16.7* 8.0 6.8 5.9 7.2  NEUTROABS 14.5* 5.5 4.5 3.5 4.9  HGB 7.7* 7.2* 7.4* 6.9* 8.8*  HCT 23.6* 21.8* 22.9* 21.0* 26.3*  MCV 88.4 86.9 88.4 86.8 86.5  PLT 272 232 241 242 242     Cardiac Enzymes: No results for input(s): CKTOTAL, CKMB, CKMBINDEX, TROPONINI in the last 168 hours.  BNP: Invalid input(s): POCBNP  CBG: Recent Labs  Lab 12/26/20 0720 12/26/20 1155 12/26/20 1617 12/26/20 2101 12/27/20 0753  GLUCAP 132* 205* 133* 198* 168*     Microbiology: Results for orders placed or performed during the hospital encounter of 12/14/20  SARS CORONAVIRUS 2 (TAT 6-24 HRS) Nasopharyngeal Nasopharyngeal Swab     Status: None   Collection Time: 12/14/20  8:28 PM   Specimen: Nasopharyngeal Swab  Result Value Ref Range Status   SARS Coronavirus 2 NEGATIVE NEGATIVE Final    Comment: (NOTE) SARS-CoV-2 target nucleic acids are NOT DETECTED.  The SARS-CoV-2 RNA is generally detectable in upper and lower respiratory specimens during the acute phase of infection. Negative results do not preclude SARS-CoV-2 infection, do not rule out co-infections with other pathogens, and should not be used as the sole basis for treatment or other patient management decisions. Negative results must be combined with clinical observations,  patient history, and epidemiological information. The expected result is Negative.  Fact Sheet for Patients: SugarRoll.be  Fact Sheet for Healthcare Providers: https://www.woods-mathews.com/  This test is not yet approved or cleared by the [place] FDA and  has been authorized for detection and/or diagnosis of SARS-CoV-2 by FDA under an Emergency Use Authorization (EUA). This EUA will remain  in effect (meaning this test can be used) for  the duration of the COVID-19 declaration under Se ction 564(b)(1) of the Act, 21 U.S.C. section 360bbb-3(b)(1), unless the authorization is terminated or revoked sooner.  Performed at [facility], [place] [address], [place], [place] [address]   SARS CORONAVIRUS 2 (TAT 6-24 HRS) Nasopharyngeal Nasopharyngeal Swab     Status: None   Collection Time: 12/19/20  4:40 PM   Specimen: Nasopharyngeal Swab  Result Value Ref Range Status   SARS Coronavirus 2 NEGATIVE NEGATIVE Final    Comment: (NOTE) SARS-CoV-2 target nucleic acids are NOT DETECTED.  The SARS-CoV-2 RNA is generally detectable in upper and lower respiratory specimens during the acute phase of infection. Negative results do not preclude SARS-CoV-2 infection, do not rule out co-infections with other pathogens, and should not be used as the sole basis for treatment or other patient management decisions. Negative results must be combined with clinical observations, patient history, and epidemiological information. The expected result is Negative.  Fact Sheet for Patients: SugarRoll.be  Fact Sheet for Healthcare Providers: https://www.woods-mathews.com/  This test is not yet approved or cleared by the [place] FDA and  has been authorized for detection and/or diagnosis of SARS-CoV-2 by FDA under an Emergency Use Authorization (EUA). This EUA will remain  in effect (meaning this test can be used) for the duration of the COVID-19 declaration under Se ction 564(b)(1) of the Act, 21 U.S.C. section 360bbb-3(b)(1), unless the authorization is terminated or revoked sooner.  Performed at [facility], [place] [address], [place], [place] [address]     Coagulation Studies: No results for input(s): LABPROT, INR in the last 72 hours.  Urinalysis: No results for input(s): COLORURINE, LABSPEC, PHURINE, GLUCOSEU, HGBUR, BILIRUBINUR, KETONESUR, PROTEINUR, UROBILINOGEN, NITRITE,  LEUKOCYTESUR in the last 72 hours.  Invalid input(s): APPERANCEUR    Imaging: No results found.   Medications:    sodium chloride 50 mL/hr at 12/27/20 2202   sodium chloride      amLODipine  10 mg Oral Daily   aspirin  81 mg Oral Daily   atorvastatin  20 mg Oral QHS   cloNIDine  0.3 mg Oral BID   clopidogrel  75 mg Oral Daily   gabapentin  200 mg Oral BID   heparin  5,000 Units Subcutaneous Q8H   hydrALAZINE  100 mg Oral Q6H   insulin aspart  0-15 Units Subcutaneous TID WC   insulin glargine  10 Units Subcutaneous QHS   isosorbide mononitrate  60 mg Oral Daily   levETIRAcetam  500 mg Oral Q24H   metoprolol tartrate  50 mg Oral BID   nystatin  1 application Topical BID   pantoprazole  40 mg Oral Daily   polyethylene glycol  17 g Oral Daily   senna-docusate  2 tablet Oral BID   vitamin B-12  250 mcg Oral Daily   acetaminophen **OR** acetaminophen, HYDROcodone-acetaminophen, hydrocortisone, iohexol, ondansetron **OR** ondansetron (ZOFRAN) IV, senna-docusate  Assessment/ Plan:  Ms. Liliane Mallis is a 65 y.o.  female who was admitted to [facility] on 11/28/2020 for AKI on CKD. Patient has previous medical conditions  including diabetes, hypertension, CVA with left hemiplegia residual, morbid obesity and chronic kidney disease stage 3B. She presents to the ED with weakness and abnormal labs.    Acute Kidney Injury on chronic kidney disease stage 3B with baseline creatinine 2.32 and GFR of 23 on 12/03/20.  Acute kidney injury secondary to ATN due to poor intake vs uncontrolled hypertension Chronic kidney disease is secondary to uncontrolled diabetes  Renal function worse today UOP 700 ml for the preceding 24 hours , draining via Pure Wick. Will begin gentle hydration, IVF 42ml/hr and reassess renal labs in am   Lab Results  Component Value Date   CREATININE 4.27 (H) 12/27/2020   CREATININE 3.86 (H) 12/26/2020   CREATININE 4.13 (H) 12/25/2020    Intake/Output Summary  (Last 24 hours) at 12/27/2020 1119 Last data filed at 12/27/2020 1015 Gross per 24 hour  Intake 962 ml  Output 2300 ml  Net -1338 ml     2. Hypertension:  BP stable for this patient   3. Diabetes mellitus type II with chronic kidney disease Hgb A1c 8.6 on 11/29/20 SSI per primary team  4. Anemia of CKD Lab Results  Component Value Date   HGB 8.8 (L) 12/27/2020  Improved after 1 unit RBCs yesterday   LOS: 11   6/13/202211:19 AM

## 2020-12-27 NOTE — Progress Notes (Signed)
PT Cancellation Note  Patient Details Name: Lahela Woodin MRN: [id number] DOB: 02-27-1956   Cancelled Treatment:    Reason Eval/Treat Not Completed: Fatigue/lethargy limiting ability to participate. Patient sleeping, unable to wake. Will return tomorrow to re-attempt therapy.    Lorelie Biermann 12/27/2020, 3:28 PM

## 2020-12-28 DIAGNOSIS — N179 Acute kidney failure, unspecified: Secondary | ICD-10-CM | POA: Diagnosis not present

## 2020-12-28 DIAGNOSIS — N189 Chronic kidney disease, unspecified: Secondary | ICD-10-CM | POA: Diagnosis not present

## 2020-12-28 LAB — GLUCOSE, CAPILLARY
Glucose-Capillary: 142 mg/dL — ABNORMAL HIGH (ref 70–99)
Glucose-Capillary: 192 mg/dL — ABNORMAL HIGH (ref 70–99)
Glucose-Capillary: 222 mg/dL — ABNORMAL HIGH (ref 70–99)
Glucose-Capillary: 256 mg/dL — ABNORMAL HIGH (ref 70–99)

## 2020-12-28 LAB — CBC WITH DIFFERENTIAL/PLATELET
Abs Immature Granulocytes: 0.06 10*3/uL (ref 0.00–0.07)
Basophils Absolute: 0 10*3/uL (ref 0.0–0.1)
Basophils Relative: 1 %
Eosinophils Absolute: 0.4 10*3/uL (ref 0.0–0.5)
Eosinophils Relative: 7 %
HCT: 27.8 % — ABNORMAL LOW (ref 36.0–46.0)
Hemoglobin: 9.1 g/dL — ABNORMAL LOW (ref 12.0–15.0)
Immature Granulocytes: 1 %
Lymphocytes Relative: 29 %
Lymphs Abs: 1.6 10*3/uL (ref 0.7–4.0)
MCH: 28.8 pg (ref 26.0–34.0)
MCHC: 32.7 g/dL (ref 30.0–36.0)
MCV: 88 fL (ref 80.0–100.0)
Monocytes Absolute: 0.7 10*3/uL (ref 0.1–1.0)
Monocytes Relative: 13 %
Neutro Abs: 2.6 10*3/uL (ref 1.7–7.7)
Neutrophils Relative %: 49 %
Platelets: 249 10*3/uL (ref 150–400)
RBC: 3.16 MIL/uL — ABNORMAL LOW (ref 3.87–5.11)
RDW: 14.7 % (ref 11.5–15.5)
WBC: 5.3 10*3/uL (ref 4.0–10.5)
nRBC: 0 % (ref 0.0–0.2)

## 2020-12-28 LAB — BASIC METABOLIC PANEL
Anion gap: 10 (ref 5–15)
BUN: 70 mg/dL — ABNORMAL HIGH (ref 8–23)
CO2: 21 mmol/L — ABNORMAL LOW (ref 22–32)
Calcium: 8.7 mg/dL — ABNORMAL LOW (ref 8.9–10.3)
Chloride: 108 mmol/L (ref 98–111)
Creatinine, Ser: 3.85 mg/dL — ABNORMAL HIGH (ref 0.44–1.00)
GFR, Estimated: 12 mL/min — ABNORMAL LOW (ref >60)
Glucose, Bld: 161 mg/dL — ABNORMAL HIGH (ref 70–99)
Potassium: 4.6 mmol/L (ref 3.5–5.1)
Sodium: 139 mmol/L (ref 135–145)

## 2020-12-28 NOTE — Progress Notes (Signed)
[facility]  ROUNDING NOTE   Subjective:   Susan House is a 65 y.o. female with medical conditions including CVA with left sided weakness, Type 2 diabetes, hyperlipidemia, seizures, hypertension and CKD 3b. Patient presents ED with weakness and abnormal labs.   Patient resting in bed Alert and oriented x 2 Denies nausea Denies shortness of breath   Objective:  Vital signs in last 24 hours:  Temp:  [97.3 F (36.3 C)-98.7 F (37.1 C)] 98.7 F (37.1 C) (06/14 0818) Pulse Rate:  [56-70] 66 (06/14 0818) Resp:  [15-18] 16 (06/14 0818) BP: (127-176)/(57-66) 176/66 (06/14 0818) SpO2:  [98 %-100 %] 100 % (06/14 0818) Weight:  [92.4 kg] 92.4 kg (06/14 0426)  Weight change: 1.3 kg Filed Weights   12/24/20 0433 12/27/20 0501 12/28/20 0426  Weight: 90.3 kg 91.1 kg 92.4 kg    Intake/Output: I/O last 3 completed shifts: In: 1552.9 [P.O.:660; I.V.:892.9] Out: 2800 [Urine:2800]   Intake/Output this shift:  No intake/output data recorded.  Physical Exam: General: Resting in bed  Head: Normocephalic, atraumatic.moist oral mucosal membranes  Eyes: Anicteric  Lungs:  Clear bilaterally, normal respiratory effort  Heart: S1S2,no rubs or gallops  Abdomen:  Soft, nontender, non distended  Extremities:  no peripheral edema.  Neurologic:  left sided weakness +, Oriented x 2  Skin: No acute lesions or rashes       Basic Metabolic Panel: Recent Labs  Lab 12/24/20 0509 12/25/20 0359 12/26/20 0426 12/27/20 0359 12/28/20 0620  NA 134* 134* 139 139 139  K 4.3 4.3 4.8 4.9 4.6  CL 103 104 110 108 108  CO2 20* 20* 22 22 21*  GLUCOSE 197* 257* 138* 186* 161*  BUN 64* 65* 64* 74* 70*  CREATININE 4.07* 4.13* 3.86* 4.27* 3.85*  CALCIUM 8.1* 8.5* 8.4* 8.3* 8.7*     Liver Function Tests: No results for input(s): AST, ALT, ALKPHOS, BILITOT, PROT, ALBUMIN in the last 168 hours. No results for input(s): LIPASE, AMYLASE in the last 168 hours. No results for  input(s): AMMONIA in the last 168 hours.  CBC: Recent Labs  Lab 12/24/20 0509 12/25/20 0359 12/26/20 0426 12/27/20 0359 12/28/20 0620  WBC 8.0 6.8 5.9 7.2 5.3  NEUTROABS 5.5 4.5 3.5 4.9 2.6  HGB 7.2* 7.4* 6.9* 8.8* 9.1*  HCT 21.8* 22.9* 21.0* 26.3* 27.8*  MCV 86.9 88.4 86.8 86.5 88.0  PLT 232 241 242 242 249     Cardiac Enzymes: No results for input(s): CKTOTAL, CKMB, CKMBINDEX, TROPONINI in the last 168 hours.  BNP: Invalid input(s): POCBNP  CBG: Recent Labs  Lab 12/27/20 0753 12/27/20 1134 12/27/20 1643 12/27/20 2116 12/28/20 0737  GLUCAP 168* 151* 185* 189* 142*     Microbiology: Results for orders placed or performed during the hospital encounter of 12/14/20  SARS CORONAVIRUS 2 (TAT 6-24 HRS) Nasopharyngeal Nasopharyngeal Swab     Status: None   Collection Time: 12/14/20  8:28 PM   Specimen: Nasopharyngeal Swab  Result Value Ref Range Status   SARS Coronavirus 2 NEGATIVE NEGATIVE Final    Comment: (NOTE) SARS-CoV-2 target nucleic acids are NOT DETECTED.  The SARS-CoV-2 RNA is generally detectable in upper and lower respiratory specimens during the acute phase of infection. Negative results do not preclude SARS-CoV-2 infection, do not rule out co-infections with other pathogens, and should not be used as the sole basis for treatment or other patient management decisions. Negative results must be combined with clinical observations, patient history, and epidemiological information. The expected result  is Negative.  Fact Sheet for Patients: SugarRoll.be  Fact Sheet for Healthcare Providers: https://www.woods-mathews.com/  This test is not yet approved or cleared by the [place] FDA and  has been authorized for detection and/or diagnosis of SARS-CoV-2 by FDA under an Emergency Use Authorization (EUA). This EUA will remain  in effect (meaning this test can be used) for the duration of the COVID-19  declaration under Se ction 564(b)(1) of the Act, 21 U.S.C. section 360bbb-3(b)(1), unless the authorization is terminated or revoked sooner.  Performed at [facility], [place] [address] [place], [place] [address]   SARS CORONAVIRUS 2 (TAT 6-24 HRS) Nasopharyngeal Nasopharyngeal Swab     Status: None   Collection Time: 12/19/20  4:40 PM   Specimen: Nasopharyngeal Swab  Result Value Ref Range Status   SARS Coronavirus 2 NEGATIVE NEGATIVE Final    Comment: (NOTE) SARS-CoV-2 target nucleic acids are NOT DETECTED.  The SARS-CoV-2 RNA is generally detectable in upper and lower respiratory specimens during the acute phase of infection. Negative results do not preclude SARS-CoV-2 infection, do not rule out co-infections with other pathogens, and should not be used as the sole basis for treatment or other patient management decisions. Negative results must be combined with clinical observations, patient history, and epidemiological information. The expected result is Negative.  Fact Sheet for Patients: SugarRoll.be  Fact Sheet for Healthcare Providers: https://www.woods-mathews.com/  This test is not yet approved or cleared by the [place] FDA and  has been authorized for detection and/or diagnosis of SARS-CoV-2 by FDA under an Emergency Use Authorization (EUA). This EUA will remain  in effect (meaning this test can be used) for the duration of the COVID-19 declaration under Se ction 564(b)(1) of the Act, 21 U.S.C. section 360bbb-3(b)(1), unless the authorization is terminated or revoked sooner.  Performed at [facility], [place] [address] [place], [place] [address]     Coagulation Studies: No results for input(s): LABPROT, INR in the last 72 hours.  Urinalysis: No results for input(s): COLORURINE, LABSPEC, PHURINE, GLUCOSEU, HGBUR, BILIRUBINUR, KETONESUR, PROTEINUR, UROBILINOGEN, NITRITE, LEUKOCYTESUR in the last 72  hours.  Invalid input(s): APPERANCEUR    Imaging: No results found.   Medications:    sodium chloride 50 mL/hr at 12/28/20 0409   sodium chloride      amLODipine  10 mg Oral Daily   aspirin  81 mg Oral Daily   atorvastatin  20 mg Oral QHS   cloNIDine  0.3 mg Oral BID   clopidogrel  75 mg Oral Daily   gabapentin  100 mg Oral BID   heparin  5,000 Units Subcutaneous Q8H   hydrALAZINE  100 mg Oral Q6H   insulin aspart  0-15 Units Subcutaneous TID WC   insulin glargine  10 Units Subcutaneous QHS   isosorbide mononitrate  60 mg Oral Daily   levETIRAcetam  500 mg Oral Q24H   metoprolol tartrate  50 mg Oral BID   nystatin  1 application Topical BID   pantoprazole  40 mg Oral Daily   polyethylene glycol  17 g Oral Daily   senna-docusate  2 tablet Oral BID   vitamin B-12  250 mcg Oral Daily   acetaminophen **OR** acetaminophen, HYDROcodone-acetaminophen, hydrocortisone, iohexol, ondansetron **OR** ondansetron (ZOFRAN) IV, senna-docusate  Assessment/ Plan:  [name] is a 65 y.o.  female who was admitted to [facility] on 11/28/2020 for AKI on CKD. Patient has previous medical conditions including diabetes, hypertension, CVA with left hemiplegia residual,  morbid obesity and chronic kidney disease stage 3B. She presents to the ED with weakness and abnormal labs.    Acute Kidney Injury on chronic kidney disease stage 3B with baseline creatinine 2.32 and GFR of 23 on 12/03/20.  Acute kidney injury secondary to ATN due to poor intake vs uncontrolled hypertension Chronic kidney disease is secondary to uncontrolled diabetes  Renal function slightly improved today UOP 2.1 l for the preceding 24 hours , draining via Pure Wick. Continue IVF 72ml/hr  Will monitor Continue current treatment  Lab Results  Component Value Date   CREATININE 3.85 (H) 12/28/2020   CREATININE 4.27 (H) 12/27/2020   CREATININE 3.86 (H) 12/26/2020    Intake/Output Summary (Last 24 hours) at 12/28/2020  1013 Last data filed at 12/28/2020 0314 Gross per 24 hour  Intake 1552.85 ml  Output 1500 ml  Net 52.85 ml     2. Hypertension:  BP stable   3. Diabetes mellitus type II with chronic kidney disease Hgb A1c 8.6 on 11/29/20 Glucose monitoring by primary team  4. Anemia of CKD Lab Results  Component Value Date   HGB 9.1 (L) 12/28/2020  Hgb not at target Will monitor   LOS: 12   6/14/202210:13 AM

## 2020-12-28 NOTE — Plan of Care (Signed)

## 2020-12-28 NOTE — Progress Notes (Signed)
Occupational Therapy Treatment Patient Details Name: Susan House MRN: [id number] DOB: 09-Dec-1955 Today's Date: 12/28/2020    History of present illness Pt is a 65 y.o. female with medical history significant for history of CVA with residual left-sided weakness, left frontal encephalomalacia, CKD stage IIIb-IV, insulin-dependent type 2 diabetes, hypertension, hyperlipidemia, seizure disorder, and memory loss who presents to the ED for evaluation of generalized weakness and abnormal labs, potential UTI. Patient recently admitted 11/28/2020-12/03/2020 for AKI on CKD, discharged to SNF.   OT comments  Pt seen for OT treatment on this date. Upon arrival to room pt awake alert, semi-supine in bed. Pt endorses fatigue from recent PT session. She declines OOB/EOB activity but agreeable to bed level activity. Therapist engages pt in [facility] of her LUE with focus on functional positioning and passive stretch of her L wrist, hand, and digits. Pt/caregiver educated on safe mobilization techniques as well as importance of wearing resting hand splint to minimize contracture/joint deformity. Pt/caregiver verbalized understanding of instruction provided. Pt making good progress toward goals and continues to benefit from skilled OT services to maximize return to PLOF and minimize risk of future falls, injury, caregiver burden, and readmission. Will continue to follow POC. Discharge recommendation remains appropriate.    Follow Up Recommendations  SNF;Supervision/Assistance - 24 hour    Equipment Recommendations   (Defer)    Recommendations for Other Services      Precautions / Restrictions Precautions Precautions: Fall Restrictions Weight Bearing Restrictions: No       Mobility Bed Mobility Overal bed mobility: Needs Assistance Bed Mobility: Supine to Sit;Sit to Supine     Supine to sit: Mod assist Sit to supine: Min assist       Transfers                 General transfer comment:  did not attempt due to patient lethargy/weakness    Balance Overall balance assessment: Needs assistance Sitting-balance support: Bilateral upper extremity supported;Feet unsupported Sitting balance-Leahy Scale: Fair Sitting balance - Comments: Pt required CGA for safety while sitting EOB with feet support and UE support Postural control: Left lateral lean                                 ADL either performed or assessed with clinical judgement   ADL Overall ADL's : Needs assistance/impaired                                       General ADL Comments: Pt continues to require MOD A for LB Access while seated EOB. MOD A +2 for functional mobility 2/2 L sided weakness and decreased activity tolerance.     Vision Baseline Vision/History: Wears glasses Wears Glasses: At all times Patient Visual Report: No change from baseline     Perception     Praxis      Cognition Arousal/Alertness: Lethargic Behavior During Therapy: WFL for tasks assessed/performed Overall Cognitive Status: History of cognitive impairments - at baseline                                 General Comments: decreased initiation, motivation        Exercises Other Exercises Other Exercises: Pt and husband educated RUE AAROM and safe use of resting hand splint. OT  facilitates LUE AAROM for wrist, hand, and digit as well as donning LUE splint.    Shoulder Instructions       General Comments      Pertinent Vitals/ Pain       Pain Assessment: No/denies pain Faces Pain Scale: No hurt  Home Living                                          Prior Functioning/Environment              Frequency  Min 2X/week        Progress Toward Goals  OT Goals(current goals can now be found in the care plan section)  Progress towards OT goals: Progressing toward goals  Acute Rehab OT Goals Patient Stated Goal: To get stronger OT Goal Formulation:  With patient Time For Goal Achievement: 12/30/20 Potential to Achieve Goals: [place] Discharge plan remains appropriate;Frequency remains appropriate    Co-evaluation                 AM-PAC OT "6 Clicks" Daily Activity     Outcome Measure   Help from another person eating meals?: A Little Help from another person taking care of personal grooming?: A Little Help from another person toileting, which includes using toliet, bedpan, or urinal?: Total Help from another person bathing (including washing, rinsing, drying)?: A Lot Help from another person to put on and taking off regular upper body clothing?: A Lot Help from another person to put on and taking off regular lower body clothing?: A Lot 6 Click Score: 13    End of Session    OT Visit Diagnosis: Unsteadiness on feet (R26.81);Muscle weakness (generalized) (M62.81)   Activity Tolerance Patient tolerated treatment well   Patient Left in bed;with call bell/phone within reach;with bed alarm set;with family/visitor present   Nurse Communication          Time: 7902-4097 OT Time Calculation (min): 19 min  Charges: OT General Charges $OT Visit: 1 Visit OT Treatments $Therapeutic Activity: 8-22 mins  Shara Blazing, M.S., OTR/L Ascom: [phone] 12/28/20, 3:59 PM  12/28/2020, 3:54 PM

## 2020-12-28 NOTE — Progress Notes (Signed)
PROGRESS NOTE    Susan House  JJO:[id number] DOB: 01/12/1956 DOA: 12/14/2020 PCP: Jerrol Banana., MD   Brief Narrative:  65 y.o. female with medical history significant for history of CVA with residual left-sided weakness, left frontal encephalomalacia, CKD stage IIIb-IV, insulin-dependent type 2 diabetes, hypertension, hyperlipidemia, seizure disorder, and memory loss who presents to the ED for evaluation of generalized weakness and abnormal labs.   Patient recently admitted 11/28/2020-12/03/2020 for AKI on CKD managed with IV fluid hydration.  Renal ultrasound showed increased echogenicity compatible with medical renal disease and a 2.7 cm right kidney cyst.  She was discharged to SNF.   Patient states that she was sent to the ED due to abnormal labs.  Found to have AKI on CKD and evidence of urinary tract infection.   Kidney function appears to have peaked at 3.44.  Now slightly downtrending.  Mental status also slowly improving.  Mental status has been waxing and waning.  Discussed this with the patient's daughter.  I feel this is a progression of underlying vascular dementia and patient if may be established a new psychiatric baseline.   Unfortunately blood pressure has been very difficult to control.  Intravenous fluids were discontinued on 6/5 and patient had subsequent deterioration kidney function 6/6.  Nephrology following.   6/7-6/13: Renal function continues to get worse although still making urine so nephrology not ready for dialysis yet but not able to discharge either as her kidney function has not returned to baseline.  She required 1 PRBC transfusion on 6/12 for hemoglobin of 6.9 with appropriate response  6/14: Blood pressure improved control over interval though still not ideal.  Kidney function slowly improving.  Fluids restarted 6/13.    Assessment & Plan:   Principal Problem:   Acute kidney injury superimposed on chronic kidney disease ([place]) Active  Problems:   Hypertension associated with diabetes ([place])   Type 2 diabetes mellitus with complications ([facility])   Hemiparesis due to old cerebrovascular accident ([place])   Hyperlipidemia associated with type 2 diabetes mellitus ([place])   Hx of completed stroke   AKI (acute kidney injury) ([place])  AKI on CKD stage IIIb Recently admitted for the same. Renal function worsened today Similar to previous presentation likely etiology is ATN in the setting of poorly controlled hypertension as well as possible relative dehydration/weight depletion  Nephrology following.   Renal ultrasound with Doppler rule showed 10.8 x 5.6 cm cystic mass on the right side.  CT abdomen pelvis did not show this mass.  Showed some colitis but she is not having any symptoms of colitis so we will hold off treatment.  Also diverticulosis but no diverticulitis.  Creat: 3.45-> 4.13->3.86-> 4.27-->3.85  Plan: Continue gentle IV fluids, NS at 50 cc/h Nephrology follow-up   Refractory hypertension  Continue amlodipine, clonidine, hydralazine metoprolol and Imdur.  Holding Vasotec per nephrology   Urinary tract infection Completed 3 days of Rocephin.   Anemia of chronic kidney disease and B12 deficiency Hemoglobin 6.9 on 6/13 - s/p 1 PRBC for transfusion and Hb 8.8  Patient denies any obvious bleeding.  Continue B12 supplement and monitor for any signs/symptoms of bleeding.   Insulin-dependent type 2 diabetes A1c 8.6%.  Reduce Lantus to 10 units nightly and continue SSI.   History of CVA with residual left-sided weakness/left frontal encephalomalacia Continue aspirin, Plavix, atorvastatin.   Seizure disorder Continue Keppra Seizure precaution   Memory loss Suspected underlying vascular dementia.   She had some reported confusion/delirium prior to  arrival.   Baseline level of mentation is unclear CT head significant for chronic volume loss and small vessel ischemic changes consistent with suspected vascular  dementia Outpatient follow-up   Constipation Continue Senokot-S and MiraLAX   DVT prophylaxis: SQ heparin Code Status: DNR Family Communication: Daughter [name] [phone] on 6/14 Disposition Plan: Status is: Inpatient  Remains inpatient appropriate because:Inpatient level of care appropriate due to severity of illness  Dispo: The patient is from: SNF              Anticipated d/c is to: SNF              Patient currently is not medically stable to d/c.   Difficult to place patient No  Blood pressure control remains challenging.  Kidney function slowly improving with IV fluids.  Disposition plan pending.     Level of care: Med-Surg  Consultants:  Nephrology  Procedures:  None  Antimicrobials:  None   Subjective: Patient seen and examined.  Sleeping but easily awakened.  No visible distress.  No pain complaints.  Objective: Vitals:   12/28/20 0304 12/28/20 0426 12/28/20 0818 12/28/20 1210  BP: (!) 144/57  (!) 176/66 (!) 159/54  Pulse: (!) 56  66 61  Resp: 18  16 18   Temp: 98.5 F (36.9 C)  98.7 F (37.1 C) 98.6 F (37 C)  TempSrc: Oral  Oral Oral  SpO2: 98%  100% 98%  Weight:  92.4 kg    Height:        Intake/Output Summary (Last 24 hours) at 12/28/2020 1440 Last data filed at 12/28/2020 1300 Gross per 24 hour  Intake 2032.85 ml  Output 500 ml  Net 1532.85 ml   Filed Weights   12/24/20 0433 12/27/20 0501 12/28/20 0426  Weight: 90.3 kg 91.1 kg 92.4 kg    Examination:  General exam: No acute distress Respiratory system: Clear to auscultation. Respiratory effort normal. Cardiovascular system: S1 & S2 heard, RRR. No JVD, murmurs, rubs, gallops or clicks. No pedal edema. Gastrointestinal system: Abdomen is nondistended, soft and nontender. No organomegaly or masses felt. Normal bowel sounds heard. Central nervous system: Alert, oriented x2, no focal deficits Extremities: Symmetric 5 x 5 power. Skin: No rashes, lesions or ulcers Psychiatry:  Judgement and insight appear impaired. Mood & affect flattened.     Data Reviewed: I have personally reviewed following labs and imaging studies  CBC: Recent Labs  Lab 12/24/20 0509 12/25/20 0359 12/26/20 0426 12/27/20 0359 12/28/20 0620  WBC 8.0 6.8 5.9 7.2 5.3  NEUTROABS 5.5 4.5 3.5 4.9 2.6  HGB 7.2* 7.4* 6.9* 8.8* 9.1*  HCT 21.8* 22.9* 21.0* 26.3* 27.8*  MCV 86.9 88.4 86.8 86.5 88.0  PLT 232 241 242 242 106   Basic Metabolic Panel: Recent Labs  Lab 12/24/20 0509 12/25/20 0359 12/26/20 0426 12/27/20 0359 12/28/20 0620  NA 134* 134* 139 139 139  K 4.3 4.3 4.8 4.9 4.6  CL 103 104 110 108 108  CO2 20* 20* 22 22 21*  GLUCOSE 197* 257* 138* 186* 161*  BUN 64* 65* 64* 74* 70*  CREATININE 4.07* 4.13* 3.86* 4.27* 3.85*  CALCIUM 8.1* 8.5* 8.4* 8.3* 8.7*   GFR: Estimated Creatinine Clearance: 15.6 mL/min (A) (by C-G formula based on SCr of 3.85 mg/dL (H)). Liver Function Tests: No results for input(s): AST, ALT, ALKPHOS, BILITOT, PROT, ALBUMIN in the last 168 hours. No results for input(s): LIPASE, AMYLASE in the last 168 hours. No results for input(s): AMMONIA in the last 168 hours.  Coagulation Profile: No results for input(s): INR, PROTIME in the last 168 hours. Cardiac Enzymes: No results for input(s): CKTOTAL, CKMB, CKMBINDEX, TROPONINI in the last 168 hours. BNP (last 3 results) No results for input(s): PROBNP in the last 8760 hours. HbA1C: No results for input(s): HGBA1C in the last 72 hours. CBG: Recent Labs  Lab 12/27/20 1134 12/27/20 1643 12/27/20 2116 12/28/20 0737 12/28/20 1211  GLUCAP 151* 185* 189* 142* 192*   Lipid Profile: No results for input(s): CHOL, HDL, LDLCALC, TRIG, CHOLHDL, LDLDIRECT in the last 72 hours. Thyroid Function Tests: No results for input(s): TSH, T4TOTAL, FREET4, T3FREE, THYROIDAB in the last 72 hours. Anemia Panel: No results for input(s): VITAMINB12, FOLATE, FERRITIN, TIBC, IRON, RETICCTPCT in the last 72 hours. Sepsis  Labs: No results for input(s): PROCALCITON, LATICACIDVEN in the last 168 hours.  Recent Results (from the past 240 hour(s))  SARS CORONAVIRUS 2 (TAT 6-24 HRS) Nasopharyngeal Nasopharyngeal Swab     Status: None   Collection Time: 12/19/20  4:40 PM   Specimen: Nasopharyngeal Swab  Result Value Ref Range Status   SARS Coronavirus 2 NEGATIVE NEGATIVE Final    Comment: (NOTE) SARS-CoV-2 target nucleic acids are NOT DETECTED.  The SARS-CoV-2 RNA is generally detectable in upper and lower respiratory specimens during the acute phase of infection. Negative results do not preclude SARS-CoV-2 infection, do not rule out co-infections with other pathogens, and should not be used as the sole basis for treatment or other patient management decisions. Negative results must be combined with clinical observations, patient history, and epidemiological information. The expected result is Negative.  Fact Sheet for Patients: SugarRoll.be  Fact Sheet for Healthcare Providers: https://www.woods-mathews.com/  This test is not yet approved or cleared by the [place] FDA and  has been authorized for detection and/or diagnosis of SARS-CoV-2 by FDA under an Emergency Use Authorization (EUA). This EUA will remain  in effect (meaning this test can be used) for the duration of the COVID-19 declaration under Se ction 564(b)(1) of the Act, 21 U.S.C. section 360bbb-3(b)(1), unless the authorization is terminated or revoked sooner.  Performed at [facility], [place] [address], [place], [place] [address]          Radiology Studies: No results found.      Scheduled Meds:  amLODipine  10 mg Oral Daily   aspirin  81 mg Oral Daily   atorvastatin  20 mg Oral QHS   cloNIDine  0.3 mg Oral BID   clopidogrel  75 mg Oral Daily   gabapentin  100 mg Oral BID   heparin  5,000 Units Subcutaneous Q8H   hydrALAZINE  100 mg Oral Q6H   insulin aspart  0-15  Units Subcutaneous TID WC   insulin glargine  10 Units Subcutaneous QHS   isosorbide mononitrate  60 mg Oral Daily   levETIRAcetam  500 mg Oral Q24H   metoprolol tartrate  50 mg Oral BID   nystatin  1 application Topical BID   pantoprazole  40 mg Oral Daily   polyethylene glycol  17 g Oral Daily   senna-docusate  2 tablet Oral BID   vitamin B-12  250 mcg Oral Daily   Continuous Infusions:  sodium chloride 50 mL/hr at 12/28/20 1211   sodium chloride       LOS: 12 days    Time spent: 25 minutes    Sidney Ace, MD [facility] Pager [phone]  If 7PM-7AM, please contact night-coverage  12/28/2020, 2:40 PM

## 2020-12-28 NOTE — Progress Notes (Signed)
Physical Therapy Treatment Patient Details Name: Susan House MRN: [id number] DOB: February 09, 1956 Today's Date: 12/28/2020    History of Present Illness Pt is a 65 y.o. female with medical history significant for history of CVA with residual left-sided weakness, left frontal encephalomalacia, CKD stage IIIb-IV, insulin-dependent type 2 diabetes, hypertension, hyperlipidemia, seizure disorder, and memory loss who presents to the ED for evaluation of generalized weakness and abnormal labs, potential UTI. Patient recently admitted 11/28/2020-12/03/2020 for AKI on CKD, discharged to SNF.    PT Comments    Patient received sleeping in bed, husband at bedside. Patient woke to our voices and initially declined all therapy but with encouragement, she agrees to some. Patient assisted with bed exercises for B LE, then required mod assist for supine><sit. She is able to sit edge of bed with min guard for safety. Left lateral leaning. Patient able to sit edge of bed x 5 min. Due to fatigue and dizziness she requested to lie back down. Patient will continue to benefit from skilled PT while here to improve strength, activity tolerance and independence.      Follow Up Recommendations  SNF     Equipment Recommendations  None recommended by PT;Other (comment) (TBD)    Recommendations for Other Services       Precautions / Restrictions Precautions Precautions: Fall Restrictions Weight Bearing Restrictions: No    Mobility  Bed Mobility Overal bed mobility: Needs Assistance Bed Mobility: Supine to Sit;Sit to Supine     Supine to sit: Mod assist Sit to supine: Min assist   General bed mobility comments: patient attempts to assist using R side to pull and push, due to left side weakness she requires min/mod assist for getting seated upright at bedside.    Transfers                 General transfer comment: did not attempt due to patient lethargy/weakness  Ambulation/Gait              General Gait Details: unable/unsafe   Stairs             Wheelchair Mobility    Modified Rankin (Stroke Patients Only)       Balance Overall balance assessment: Needs assistance Sitting-balance support: Bilateral upper extremity supported;Feet unsupported Sitting balance-Leahy Scale: Fair Sitting balance - Comments: Pt required CGA for safety while sitting EOB with feet support and UE support Postural control: Left lateral lean                                  Cognition Arousal/Alertness: Lethargic Behavior During Therapy: WFL for tasks assessed/performed Overall Cognitive Status: History of cognitive impairments - at baseline                                 General Comments: decreased initiation, motivation      Exercises Other Exercises Other Exercises: B LE exercises: ( assist needed on left side) ap, heel slides, hip abd/add, SLR x 10 reps each. Seated LAQ x 10 on right    General Comments        Pertinent Vitals/Pain Pain Assessment: No/denies pain    Home Living                      Prior Function  PT Goals (current goals can now be found in the care plan section) Acute Rehab PT Goals Patient Stated Goal: To get stronger PT Goal Formulation: With patient Time For Goal Achievement: 01/11/21 Potential to Achieve Goals: Fair Progress towards PT goals: Progressing toward goals    Frequency    Min 2X/week      PT Plan Current plan remains appropriate    Co-evaluation              AM-PAC PT "6 Clicks" Mobility   Outcome Measure  Help needed turning from your back to your side while in a flat bed without using bedrails?: A Lot Help needed moving from lying on your back to sitting on the side of a flat bed without using bedrails?: A Lot Help needed moving to and from a bed to a chair (including a wheelchair)?: A Lot Help needed standing up from a chair using your arms (e.g.,  wheelchair or bedside chair)?: A Lot Help needed to walk in hospital room?: Total Help needed climbing 3-5 steps with a railing? : Total 6 Click Score: 10    End of Session   Activity Tolerance: Patient limited by fatigue;Patient limited by lethargy Patient left: in bed;with bed alarm set;with call bell/phone within reach;with family/visitor present Nurse Communication: Mobility status PT Visit Diagnosis: Other abnormalities of gait and mobility (R26.89);Muscle weakness (generalized) (M62.81)     Time: 7322-0254 PT Time Calculation (min) (ACUTE ONLY): 20 min  Charges:  $Therapeutic Exercise: 8-22 mins                    [employer], PT, GCS 12/28/20,2:41 PM

## 2020-12-29 DIAGNOSIS — N179 Acute kidney failure, unspecified: Secondary | ICD-10-CM | POA: Diagnosis not present

## 2020-12-29 DIAGNOSIS — E43 Unspecified severe protein-calorie malnutrition: Secondary | ICD-10-CM | POA: Insufficient documentation

## 2020-12-29 DIAGNOSIS — N189 Chronic kidney disease, unspecified: Secondary | ICD-10-CM | POA: Diagnosis not present

## 2020-12-29 LAB — CBC WITH DIFFERENTIAL/PLATELET
Abs Immature Granulocytes: 0.04 10*3/uL (ref 0.00–0.07)
Basophils Absolute: 0.1 10*3/uL (ref 0.0–0.1)
Basophils Relative: 1 %
Eosinophils Absolute: 0.4 10*3/uL (ref 0.0–0.5)
Eosinophils Relative: 8 %
HCT: 26.2 % — ABNORMAL LOW (ref 36.0–46.0)
Hemoglobin: 8.9 g/dL — ABNORMAL LOW (ref 12.0–15.0)
Immature Granulocytes: 1 %
Lymphocytes Relative: 28 %
Lymphs Abs: 1.4 10*3/uL (ref 0.7–4.0)
MCH: 29.5 pg (ref 26.0–34.0)
MCHC: 34 g/dL (ref 30.0–36.0)
MCV: 86.8 fL (ref 80.0–100.0)
Monocytes Absolute: 0.7 10*3/uL (ref 0.1–1.0)
Monocytes Relative: 13 %
Neutro Abs: 2.6 10*3/uL (ref 1.7–7.7)
Neutrophils Relative %: 49 %
Platelets: 261 10*3/uL (ref 150–400)
RBC: 3.02 MIL/uL — ABNORMAL LOW (ref 3.87–5.11)
RDW: 14.7 % (ref 11.5–15.5)
WBC: 5.2 10*3/uL (ref 4.0–10.5)
nRBC: 0 % (ref 0.0–0.2)

## 2020-12-29 LAB — BASIC METABOLIC PANEL
Anion gap: 11 (ref 5–15)
BUN: 66 mg/dL — ABNORMAL HIGH (ref 8–23)
CO2: 20 mmol/L — ABNORMAL LOW (ref 22–32)
Calcium: 8.4 mg/dL — ABNORMAL LOW (ref 8.9–10.3)
Chloride: 107 mmol/L (ref 98–111)
Creatinine, Ser: 4.05 mg/dL — ABNORMAL HIGH (ref 0.44–1.00)
GFR, Estimated: 12 mL/min — ABNORMAL LOW (ref >60)
Glucose, Bld: 233 mg/dL — ABNORMAL HIGH (ref 70–99)
Potassium: 4.7 mmol/L (ref 3.5–5.1)
Sodium: 138 mmol/L (ref 135–145)

## 2020-12-29 LAB — GLUCOSE, CAPILLARY
Glucose-Capillary: 213 mg/dL — ABNORMAL HIGH (ref 70–99)
Glucose-Capillary: 295 mg/dL — ABNORMAL HIGH (ref 70–99)
Glucose-Capillary: 285 mg/dL — ABNORMAL HIGH (ref 70–99)
Glucose-Capillary: 168 mg/dL — ABNORMAL HIGH (ref 70–99)

## 2020-12-29 LAB — PROTEIN / CREATININE RATIO, URINE
Creatinine, Urine: 47 mg/dL
Protein Creatinine Ratio: 5.96 mg/mg{Cre} — ABNORMAL HIGH (ref 0.00–0.15)
Total Protein, Urine: 280 mg/dL

## 2020-12-29 MED ORDER — ADULT MULTIVITAMIN W/MINERALS CH
1.0000 | ORAL_TABLET | Freq: Every day | ORAL | Status: DC
  Administered 2020-12-29 – 2021-01-04 (×7): 1 via ORAL
  Filled 2020-12-29 (×7): qty 1

## 2020-12-29 MED ORDER — INSULIN ASPART 100 UNIT/ML IJ SOLN
3.0000 [IU] | Freq: Three times a day (TID) | INTRAMUSCULAR | Status: DC
  Administered 2020-12-29 – 2021-01-06 (×23): 3 [IU] via SUBCUTANEOUS
  Filled 2020-12-29 (×23): qty 1

## 2020-12-29 NOTE — Progress Notes (Signed)
Inpatient Diabetes Program Recommendations  AACE/ADA: New Consensus Statement on Inpatient Glycemic Control   Target Ranges:  Prepandial:   less than 140 mg/dL      Peak postprandial:   less than 180 mg/dL (1-2 hours)      Critically ill patients:  140 - 180 mg/dL   Results for [name], ETTA GASSETT" (MRN [id number]) as of 12/29/2020 09:54  Ref. Range 12/28/2020 07:37 12/28/2020 12:11 12/28/2020 16:39 12/28/2020 21:43 12/29/2020 07:33  Glucose-Capillary Latest Ref Range: 70 - 99 mg/dL 142 (H) 192 (H) 222 (H) 256 (H) 213 (H)    Review of Glycemic Control  Diabetes history: DM2 Outpatient Diabetes medications: Lantus 14 units daily, Novolog 0-20 units TID with meals Current orders for Inpatient glycemic control: Lantus 10 units QHS, Novolog 0-15 units TID with meals  Inpatient Diabetes Program Recommendations:    Insulin: Please consider ordering Novolog 3 units TID with meals for meal coverage if patient eats at least 50% of meals.   Thanks, Barnie Alderman, RN, MSN, CDE Diabetes Coordinator Inpatient Diabetes Program [phone] (Team Pager from 8am to 5pm)

## 2020-12-29 NOTE — Progress Notes (Signed)
PROGRESS NOTE    Susan House  KVQ:[id number] DOB: 05/03/1956 DOA: 12/14/2020 PCP: Jerrol Banana., MD   Brief Narrative:  65 y.o. female with medical history significant for history of CVA with residual left-sided weakness, left frontal encephalomalacia, CKD stage IIIb-IV, insulin-dependent type 2 diabetes, hypertension, hyperlipidemia, seizure disorder, and memory loss who presents to the ED for evaluation of generalized weakness and abnormal labs.   Patient recently admitted 11/28/2020-12/03/2020 for AKI on CKD managed with IV fluid hydration.  Renal ultrasound showed increased echogenicity compatible with medical renal disease and a 2.7 cm right kidney cyst.  She was discharged to SNF.   Patient states that she was sent to the ED due to abnormal labs.  Found to have AKI on CKD and evidence of urinary tract infection.   Kidney function appears to have peaked at 3.44.  Now slightly downtrending.  Mental status also slowly improving.  Mental status has been waxing and waning.  Discussed this with the patient's daughter.  I feel this is a progression of underlying vascular dementia and patient if may be established a new psychiatric baseline.   Unfortunately blood pressure has been very difficult to control.  Intravenous fluids were discontinued on 6/5 and patient had subsequent deterioration kidney function 6/6.  Nephrology following.   6/7-6/13: Renal function continues to get worse although still making urine so nephrology not ready for dialysis yet but not able to discharge either as her kidney function has not returned to baseline.  She required 1 PRBC transfusion on 6/12 for hemoglobin of 6.9 with appropriate response  6/14: Blood pressure improved control over interval though still not ideal.  Kidney function slowly improving.  Fluids restarted 6/13.  6/15: Kidney function is deteriorating despite gentle IV fluid resuscitation.  Case discussed with nephrology.  Lengthy  conversation with the husband and patient at bedside.  Likely that hemodialysis would be indicated.  Still questionable whether this is within the patient's and family's goals of care.   Assessment & Plan:   Principal Problem:   Acute kidney injury superimposed on chronic kidney disease ([place]) Active Problems:   Hypertension associated with diabetes ([place])   Type 2 diabetes mellitus with complications ([facility])   Hemiparesis due to old cerebrovascular accident ([place])   Hyperlipidemia associated with type 2 diabetes mellitus ([place])   Hx of completed stroke   AKI (acute kidney injury) ([place])  AKI on CKD stage IIIb Recently admitted for the same. Renal function worsened today Similar to previous presentation likely etiology is ATN in the setting of poorly controlled hypertension as well as possible relative dehydration/weight depletion  Nephrology following.   Renal ultrasound with Doppler rule showed 10.8 x 5.6 cm cystic mass on the right side.  CT abdomen pelvis did not show this mass.  Showed some colitis but she is not having any symptoms of colitis so we will hold off treatment.  Also diverticulosis but no diverticulitis.  Creat: 3.45-> 4.13->3.86-> 4.27-->3.85-->4.05  Plan: Per nephrology patient will likely require hemodialysis.  She has had deterioration of kidney function despite fluid resuscitation and improved control of blood pressure.  Likely etiology of renal function decline secondary to longstanding refractory hypertension and diabetes. -Nephrology to follow-up with patient and family regarding decision of dialysis.  If patient elects not to proceed with dialysis then referral to palliative care or hospice may be appropriate.   Refractory hypertension  Continue amlodipine, clonidine, hydralazine metoprolol and Imdur.  Holding Vasotec per nephrology   Urinary tract  infection Completed 3 days of Rocephin.   Anemia of chronic kidney disease and B12 deficiency Hemoglobin 6.9 on  6/13 - s/p 1 PRBC for transfusion and Hb 8.8  Patient denies any obvious bleeding.  Continue B12 supplement and monitor for any signs/symptoms of bleeding.   Insulin-dependent type 2 diabetes A1c 8.6% Lantus 10 units nightly NovoLog 3 units 3 times daily with meals Moderate sliding scale Carb modified diet   History of CVA with residual left-sided weakness/left frontal encephalomalacia Continue aspirin, Plavix, atorvastatin.   Seizure disorder Continue Keppra Seizure precaution   Memory loss Suspected underlying vascular dementia.   She had some reported confusion/delirium prior to arrival.   Baseline level of mentation is unclear CT head significant for chronic volume loss and small vessel ischemic changes consistent with suspected vascular dementia Outpatient follow-up   Constipation Continue Senokot-S and MiraLAX   DVT prophylaxis: SQ heparin Code Status: DNR Family Communication: Daughter [name] [phone] on 6/14 Disposition Plan: Status is: Inpatient  Remains inpatient appropriate because:Inpatient level of care appropriate due to severity of illness  Dispo: The patient is from: SNF              Anticipated d/c is to: SNF              Patient currently is not medically stable to d/c.   Difficult to place patient No  Kidney function continues to deteriorate despite intravenous fluid resuscitation.  Question regarding need for dialysis and whether this is in the patient's goals of care.     Level of care: Med-Surg  Consultants:  Nephrology  Procedures:  None  Antimicrobials:  None   Subjective: Patient seen and examined.  Sleeping but easily awakened.  No visible distress.  Wants to go home.  Objective: Vitals:   12/29/20 0300 12/29/20 0501 12/29/20 0917 12/29/20 1131  BP: (!) 178/65 (!) 160/54 (!) 165/64 (!) 152/61  Pulse: 66 62 64 64  Resp:  20 16 16   Temp:  98.8 F (37.1 C) 98.5 F (36.9 C) 98.4 F (36.9 C)  TempSrc:  Oral Oral Oral   SpO2:  97% 98% 97%  Weight:      Height:        Intake/Output Summary (Last 24 hours) at 12/29/2020 1258 Last data filed at 12/29/2020 1004 Gross per 24 hour  Intake 1440 ml  Output 1000 ml  Net 440 ml   Filed Weights   12/24/20 0433 12/27/20 0501 12/28/20 0426  Weight: 90.3 kg 91.1 kg 92.4 kg    Examination:  General exam: No acute distress Respiratory system: Clear to auscultation. Respiratory effort normal. Cardiovascular system: S1 & S2 heard, RRR. No JVD, murmurs, rubs, gallops or clicks. No pedal edema. Gastrointestinal system: Abdomen is nondistended, soft and nontender. No organomegaly or masses felt. Normal bowel sounds heard. Central nervous system: Alert, oriented x2, no focal deficits Extremities: Symmetric 5 x 5 power. Skin: No rashes, lesions or ulcers Psychiatry: Judgement and insight appear impaired. Mood & affect flattened.     Data Reviewed: I have personally reviewed following labs and imaging studies  CBC: Recent Labs  Lab 12/25/20 0359 12/26/20 0426 12/27/20 0359 12/28/20 0620 12/29/20 0629  WBC 6.8 5.9 7.2 5.3 5.2  NEUTROABS 4.5 3.5 4.9 2.6 2.6  HGB 7.4* 6.9* 8.8* 9.1* 8.9*  HCT 22.9* 21.0* 26.3* 27.8* 26.2*  MCV 88.4 86.8 86.5 88.0 86.8  PLT 241 242 242 249 970   Basic Metabolic Panel: Recent Labs  Lab 12/25/20 0359 12/26/20 0426  12/27/20 0359 12/28/20 0620 12/29/20 0629  NA 134* 139 139 139 138  K 4.3 4.8 4.9 4.6 4.7  CL 104 110 108 108 107  CO2 20* 22 22 21* 20*  GLUCOSE 257* 138* 186* 161* 233*  BUN 65* 64* 74* 70* 66*  CREATININE 4.13* 3.86* 4.27* 3.85* 4.05*  CALCIUM 8.5* 8.4* 8.3* 8.7* 8.4*   GFR: Estimated Creatinine Clearance: 14.8 mL/min (A) (by C-G formula based on SCr of 4.05 mg/dL (H)). Liver Function Tests: No results for input(s): AST, ALT, ALKPHOS, BILITOT, PROT, ALBUMIN in the last 168 hours. No results for input(s): LIPASE, AMYLASE in the last 168 hours. No results for input(s): AMMONIA in the last 168  hours. Coagulation Profile: No results for input(s): INR, PROTIME in the last 168 hours. Cardiac Enzymes: No results for input(s): CKTOTAL, CKMB, CKMBINDEX, TROPONINI in the last 168 hours. BNP (last 3 results) No results for input(s): PROBNP in the last 8760 hours. HbA1C: No results for input(s): HGBA1C in the last 72 hours. CBG: Recent Labs  Lab 12/28/20 1211 12/28/20 1639 12/28/20 2143 12/29/20 0733 12/29/20 1128  GLUCAP 192* 222* 256* 213* 295*   Lipid Profile: No results for input(s): CHOL, HDL, LDLCALC, TRIG, CHOLHDL, LDLDIRECT in the last 72 hours. Thyroid Function Tests: No results for input(s): TSH, T4TOTAL, FREET4, T3FREE, THYROIDAB in the last 72 hours. Anemia Panel: No results for input(s): VITAMINB12, FOLATE, FERRITIN, TIBC, IRON, RETICCTPCT in the last 72 hours. Sepsis Labs: No results for input(s): PROCALCITON, LATICACIDVEN in the last 168 hours.  Recent Results (from the past 240 hour(s))  SARS CORONAVIRUS 2 (TAT 6-24 HRS) Nasopharyngeal Nasopharyngeal Swab     Status: None   Collection Time: 12/19/20  4:40 PM   Specimen: Nasopharyngeal Swab  Result Value Ref Range Status   SARS Coronavirus 2 NEGATIVE NEGATIVE Final    Comment: (NOTE) SARS-CoV-2 target nucleic acids are NOT DETECTED.  The SARS-CoV-2 RNA is generally detectable in upper and lower respiratory specimens during the acute phase of infection. Negative results do not preclude SARS-CoV-2 infection, do not rule out co-infections with other pathogens, and should not be used as the sole basis for treatment or other patient management decisions. Negative results must be combined with clinical observations, patient history, and epidemiological information. The expected result is Negative.  Fact Sheet for Patients: SugarRoll.be  Fact Sheet for Healthcare Providers: https://www.woods-mathews.com/  This test is not yet approved or cleared by the [place]  FDA and  has been authorized for detection and/or diagnosis of SARS-CoV-2 by FDA under an Emergency Use Authorization (EUA). This EUA will remain  in effect (meaning this test can be used) for the duration of the COVID-19 declaration under Se ction 564(b)(1) of the Act, 21 U.S.C. section 360bbb-3(b)(1), unless the authorization is terminated or revoked sooner.  Performed at [facility], [place] [address], [place], [place] [address]          Radiology Studies: No results found.      Scheduled Meds:  amLODipine  10 mg Oral Daily   aspirin  81 mg Oral Daily   atorvastatin  20 mg Oral QHS   cloNIDine  0.3 mg Oral BID   clopidogrel  75 mg Oral Daily   gabapentin  100 mg Oral BID   heparin  5,000 Units Subcutaneous Q8H   hydrALAZINE  100 mg Oral Q6H   insulin aspart  0-15 Units Subcutaneous TID WC   insulin aspart  3 Units Subcutaneous TID WC   insulin  glargine  10 Units Subcutaneous QHS   isosorbide mononitrate  60 mg Oral Daily   levETIRAcetam  500 mg Oral Q24H   metoprolol tartrate  50 mg Oral BID   nystatin  1 application Topical BID   pantoprazole  40 mg Oral Daily   polyethylene glycol  17 g Oral Daily   senna-docusate  2 tablet Oral BID   vitamin B-12  250 mcg Oral Daily   Continuous Infusions:  sodium chloride 50 mL/hr at 12/28/20 2331   sodium chloride       LOS: 13 days    Time spent: 15 minutes    Sidney Ace, MD [facility] Pager [phone]  If 7PM-7AM, please contact night-coverage  12/29/2020, 12:58 PM

## 2020-12-29 NOTE — Progress Notes (Signed)
Initial Nutrition Assessment  DOCUMENTATION CODES:  Severe malnutrition in context of acute illness/injury, Obesity unspecified  INTERVENTION:  Add Mighty Shake II TID with meals, each supplement provides 480-500 kcals and 20-23 grams of protein.  Add Magic cup TID with meals, each supplement provides 290 kcal and 9 grams of protein.  Add MVI with minerals daily.  NUTRITION DIAGNOSIS:  Severe Malnutrition related to acute illness, chronic illness, poor appetite as evidenced by energy intake < or equal to 50% for > or equal to 5 days, percent weight loss.  GOAL:  Patient will meet greater than or equal to 90% of their needs  MONITOR:  PO intake, Supplement acceptance, Labs, Weight trends, I & O's  REASON FOR ASSESSMENT:  LOS    ASSESSMENT:  65 yo female with a PMH of depression, HTN, GERD, CVA with residual L hemiplegia, T2DM, HLD, IBS, and CKD stage 3a who presents with generalized weakness, abnormal lab values, and AKI on CKD.  Spoke with pt at bedside. Pt does the best she can with eating. Sometimes, she forgets. Her husband prepares meals for her and tries to encourage intake as he knows it's important for her. She has had poor intake PTA. Her appetite is improving. Husband reports she does not eat "nearly enough." Per Epic, pt has eaten 100% for 6 of 8 last documented meals and 50% for the other two.   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 Added this to EMR and [employer].  Per Epic, pt's weight has been fluctuating dramatically over the past 2 years. Pt has lost ~26 lbs (11.3%) in the last month, which is significant and severe.  On exam, pt has few mild depletions.  Given above information, pt is severely malnourished in the acute setting.  Pt does not like Ensure, but she is willing to try Mighty shakes TID and Magic Cup TID to promote caloric and protein intake. Also recommend adding MVI with minerals.  Medications: reviewed; SSI, bedtime Lantus, Keppra,  Protonix, miralax, Senokot BID, Vitamin B12, NaCl @ 50 ml/hr  Labs: reviewed; CBG 142-256 HbA1c: 8.6% (11/2020)  NUTRITION - FOCUSED PHYSICAL EXAM: Flowsheet Row Most Recent Value  Orbital Region No depletion  Upper Arm Region No depletion  Thoracic and Lumbar Region No depletion  Buccal Region No depletion  Temple Region Mild depletion  Clavicle Bone Region No depletion  Clavicle and Acromion Bone Region No depletion  Scapular Bone Region No depletion  Dorsal Hand Mild depletion  Patellar Region No depletion  Anterior Thigh Region No depletion  Posterior Calf Region No depletion  Edema (RD Assessment) Mild  Hair Reviewed  Eyes Reviewed  Mouth Reviewed  Skin Reviewed  Nails Reviewed   Diet Order:   Diet Order             Diet Carb Modified Fluid consistency: Thin; Room service appropriate? Yes  Diet effective now                  EDUCATION NEEDS:  Education needs have been addressed  Skin:  Skin Assessment: Reviewed RN Assessment  Last BM:  12/26/20 - Type 5, large  Height:  Ht Readings from Last 1 Encounters:  12/24/20 5\' 2"  (1.575 m)   Weight:  Wt Readings from Last 1 Encounters:  12/28/20 92.4 kg   Ideal Body Weight:  50 kg  BMI:  Body mass index is 37.26 kg/m.  Estimated Nutritional Needs:  Kcal:  1750-1950 Protein:  90-105 grams Fluid:  >1.75  L  Derrel Nip, RD, LDN Registered Dietitian I After-Hours/Weekend Pager # in [place]

## 2020-12-29 NOTE — Progress Notes (Signed)
[facility]  ROUNDING NOTE   Subjective:   Susan House is a 65 y.o. female with medical conditions including CVA with left sided weakness, Type 2 diabetes, hyperlipidemia, seizures, hypertension and CKD 3b. Patient presents ED with weakness and abnormal labs.   Patient resting in bed, breakfast completed Alert and oriented x 2 Denies nausea Denies shortness of breath   Objective:  Vital signs in last 24 hours:  Temp:  [97.5 F (36.4 C)-98.8 F (37.1 C)] 98.5 F (36.9 C) (06/15 0917) Pulse Rate:  [56-68] 64 (06/15 0917) Resp:  [16-20] 16 (06/15 0917) BP: (119-178)/(54-79) 165/64 (06/15 0917) SpO2:  [97 %-100 %] 98 % (06/15 0917)  Weight change:  Filed Weights   12/24/20 0433 12/27/20 0501 12/28/20 0426  Weight: 90.3 kg 91.1 kg 92.4 kg    Intake/Output: I/O last 3 completed shifts: In: 2092.6 [P.O.:1500; I.V.:592.6] Out: 1500 [Urine:1500]   Intake/Output this shift:  Total I/O In: 360 [P.O.:360] Out: -   Physical Exam: General: Resting in bed  Head: Normocephalic, atraumatic.moist oral mucosal membranes  Eyes: Anicteric  Lungs:  Clear bilaterally, normal respiratory effort  Heart: S1S2,no rubs or gallops  Abdomen:  Soft, nontender, non distended  Extremities:  trace peripheral edema.  Neurologic:  left sided weakness +, Oriented x 2  Skin: No acute lesions or rashes       Basic Metabolic Panel: Recent Labs  Lab 12/25/20 0359 12/26/20 0426 12/27/20 0359 12/28/20 0620 12/29/20 0629  NA 134* 139 139 139 138  K 4.3 4.8 4.9 4.6 4.7  CL 104 110 108 108 107  CO2 20* 22 22 21* 20*  GLUCOSE 257* 138* 186* 161* 233*  BUN 65* 64* 74* 70* 66*  CREATININE 4.13* 3.86* 4.27* 3.85* 4.05*  CALCIUM 8.5* 8.4* 8.3* 8.7* 8.4*     Liver Function Tests: No results for input(s): AST, ALT, ALKPHOS, BILITOT, PROT, ALBUMIN in the last 168 hours. No results for input(s): LIPASE, AMYLASE in the last 168 hours. No results for input(s): AMMONIA in the  last 168 hours.  CBC: Recent Labs  Lab 12/25/20 0359 12/26/20 0426 12/27/20 0359 12/28/20 0620 12/29/20 0629  WBC 6.8 5.9 7.2 5.3 5.2  NEUTROABS 4.5 3.5 4.9 2.6 2.6  HGB 7.4* 6.9* 8.8* 9.1* 8.9*  HCT 22.9* 21.0* 26.3* 27.8* 26.2*  MCV 88.4 86.8 86.5 88.0 86.8  PLT 241 242 242 249 261     Cardiac Enzymes: No results for input(s): CKTOTAL, CKMB, CKMBINDEX, TROPONINI in the last 168 hours.  BNP: Invalid input(s): POCBNP  CBG: Recent Labs  Lab 12/28/20 0737 12/28/20 1211 12/28/20 1639 12/28/20 2143 12/29/20 0733  GLUCAP 142* 192* 222* 256* 213*     Microbiology: Results for orders placed or performed during the hospital encounter of 12/14/20  SARS CORONAVIRUS 2 (TAT 6-24 HRS) Nasopharyngeal Nasopharyngeal Swab     Status: None   Collection Time: 12/14/20  8:28 PM   Specimen: Nasopharyngeal Swab  Result Value Ref Range Status   SARS Coronavirus 2 NEGATIVE NEGATIVE Final    Comment: (NOTE) SARS-CoV-2 target nucleic acids are NOT DETECTED.  The SARS-CoV-2 RNA is generally detectable in upper and lower respiratory specimens during the acute phase of infection. Negative results do not preclude SARS-CoV-2 infection, do not rule out co-infections with other pathogens, and should not be used as the sole basis for treatment or other patient management decisions. Negative results must be combined with clinical observations, patient history, and epidemiological information. The expected result is Negative.  Fact Sheet for Patients: SugarRoll.be  Fact Sheet for Healthcare Providers: https://www.woods-mathews.com/  This test is not yet approved or cleared by the [place] FDA and  has been authorized for detection and/or diagnosis of SARS-CoV-2 by FDA under an Emergency Use Authorization (EUA). This EUA will remain  in effect (meaning this test can be used) for the duration of the COVID-19 declaration under Se ction  564(b)(1) of the Act, 21 U.S.C. section 360bbb-3(b)(1), unless the authorization is terminated or revoked sooner.  Performed at [facility], [place] [address], [place], [place] [address]   SARS CORONAVIRUS 2 (TAT 6-24 HRS) Nasopharyngeal Nasopharyngeal Swab     Status: None   Collection Time: 12/19/20  4:40 PM   Specimen: Nasopharyngeal Swab  Result Value Ref Range Status   SARS Coronavirus 2 NEGATIVE NEGATIVE Final    Comment: (NOTE) SARS-CoV-2 target nucleic acids are NOT DETECTED.  The SARS-CoV-2 RNA is generally detectable in upper and lower respiratory specimens during the acute phase of infection. Negative results do not preclude SARS-CoV-2 infection, do not rule out co-infections with other pathogens, and should not be used as the sole basis for treatment or other patient management decisions. Negative results must be combined with clinical observations, patient history, and epidemiological information. The expected result is Negative.  Fact Sheet for Patients: SugarRoll.be  Fact Sheet for Healthcare Providers: https://www.woods-mathews.com/  This test is not yet approved or cleared by the [place] FDA and  has been authorized for detection and/or diagnosis of SARS-CoV-2 by FDA under an Emergency Use Authorization (EUA). This EUA will remain  in effect (meaning this test can be used) for the duration of the COVID-19 declaration under Se ction 564(b)(1) of the Act, 21 U.S.C. section 360bbb-3(b)(1), unless the authorization is terminated or revoked sooner.  Performed at [facility], [place] [address] [place], [place] [address]     Coagulation Studies: No results for input(s): LABPROT, INR in the last 72 hours.  Urinalysis: No results for input(s): COLORURINE, LABSPEC, PHURINE, GLUCOSEU, HGBUR, BILIRUBINUR, KETONESUR, PROTEINUR, UROBILINOGEN, NITRITE, LEUKOCYTESUR in the last 72 hours.  Invalid input(s):  APPERANCEUR    Imaging: No results found.   Medications:    sodium chloride 50 mL/hr at 12/28/20 2331   sodium chloride      amLODipine  10 mg Oral Daily   aspirin  81 mg Oral Daily   atorvastatin  20 mg Oral QHS   cloNIDine  0.3 mg Oral BID   clopidogrel  75 mg Oral Daily   gabapentin  100 mg Oral BID   heparin  5,000 Units Subcutaneous Q8H   hydrALAZINE  100 mg Oral Q6H   insulin aspart  0-15 Units Subcutaneous TID WC   insulin glargine  10 Units Subcutaneous QHS   isosorbide mononitrate  60 mg Oral Daily   levETIRAcetam  500 mg Oral Q24H   metoprolol tartrate  50 mg Oral BID   nystatin  1 application Topical BID   pantoprazole  40 mg Oral Daily   polyethylene glycol  17 g Oral Daily   senna-docusate  2 tablet Oral BID   vitamin B-12  250 mcg Oral Daily   acetaminophen **OR** acetaminophen, HYDROcodone-acetaminophen, hydrocortisone, iohexol, ondansetron **OR** ondansetron (ZOFRAN) IV, senna-docusate  Assessment/ Plan:  Ms. Siddhi Dornbush is a 65 y.o.  female who was admitted to [facility] on 11/28/2020 for AKI on CKD. Patient has previous medical conditions including diabetes, hypertension, CVA with left hemiplegia residual, morbid obesity and  chronic kidney disease stage 3B. She presents to the ED with weakness and abnormal labs.    Acute Kidney Injury on chronic kidney disease stage 3B with baseline creatinine 2.32 and GFR of 23 on 12/03/20.  Acute kidney injury secondary to ATN due to poor intake vs uncontrolled hypertension Chronic kidney disease is secondary to uncontrolled diabetes Renal function worse today UOP 1 L for the preceding 24 hours , via [place]. Continue IVF 25ml/hr  Discussed with patient and husband that renal recovery has not progressed as we have wished despite therapies. Hemodialysis would be the next therapy would help correct kidney function, but it is unknown if this would be needed short term or long term. When asked if this would be something  the pateint would be interested in pursuing if needed, she shook her head no. It was then explained that without kidney function, life is not sustainable. Her husband suggest he start dialysis. Patient and husband were left to discuss dialysis vs palliative/hospice care.  Will continue to monitor  Lab Results  Component Value Date   CREATININE 4.05 (H) 12/29/2020   CREATININE 3.85 (H) 12/28/2020   CREATININE 4.27 (H) 12/27/2020    Intake/Output Summary (Last 24 hours) at 12/29/2020 1052 Last data filed at 12/29/2020 1004 Gross per 24 hour  Intake 1440 ml  Output 1000 ml  Net 440 ml     2. Hypertension:  BP elevated but stable for this patient   3. Diabetes mellitus type II with chronic kidney disease Hgb A1c 8.6 on 11/29/20 Glucose monitoring by primary team  4. Anemia of CKD Lab Results  Component Value Date   HGB 8.9 (L) 12/29/2020  Hgb below goal of 10 Will monitor   LOS: 13   6/15/202210:52 AM

## 2020-12-30 ENCOUNTER — Encounter: Payer: Self-pay | Admitting: Internal Medicine

## 2020-12-30 DIAGNOSIS — Z515 Encounter for palliative care: Secondary | ICD-10-CM

## 2020-12-30 DIAGNOSIS — N179 Acute kidney failure, unspecified: Secondary | ICD-10-CM | POA: Diagnosis not present

## 2020-12-30 DIAGNOSIS — N189 Chronic kidney disease, unspecified: Secondary | ICD-10-CM | POA: Diagnosis not present

## 2020-12-30 DIAGNOSIS — Z7189 Other specified counseling: Secondary | ICD-10-CM | POA: Diagnosis not present

## 2020-12-30 DIAGNOSIS — I69359 Hemiplegia and hemiparesis following cerebral infarction affecting unspecified side: Secondary | ICD-10-CM | POA: Diagnosis not present

## 2020-12-30 LAB — PROTEIN ELECTROPHORESIS, SERUM
A/G Ratio: 1.1 (ref 0.7–1.7)
Albumin ELP: 2.8 g/dL — ABNORMAL LOW (ref 2.9–4.4)
Alpha-1-Globulin: 0.2 g/dL (ref 0.0–0.4)
Alpha-2-Globulin: 0.9 g/dL (ref 0.4–1.0)
Beta Globulin: 0.6 g/dL — ABNORMAL LOW (ref 0.7–1.3)
Gamma Globulin: 0.8 g/dL (ref 0.4–1.8)
Globulin, Total: 2.6 g/dL (ref 2.2–3.9)
Total Protein ELP: 5.4 g/dL — ABNORMAL LOW (ref 6.0–8.5)

## 2020-12-30 LAB — C4 COMPLEMENT: Complement C4, Body Fluid: 29 mg/dL (ref 12–38)

## 2020-12-30 LAB — CBC WITH DIFFERENTIAL/PLATELET
Abs Immature Granulocytes: 0.03 10*3/uL (ref 0.00–0.07)
Basophils Absolute: 0.1 10*3/uL (ref 0.0–0.1)
Basophils Relative: 1 %
Eosinophils Absolute: 0.5 10*3/uL (ref 0.0–0.5)
Eosinophils Relative: 8 %
HCT: 27.3 % — ABNORMAL LOW (ref 36.0–46.0)
Hemoglobin: 9 g/dL — ABNORMAL LOW (ref 12.0–15.0)
Immature Granulocytes: 1 %
Lymphocytes Relative: 22 %
Lymphs Abs: 1.4 10*3/uL (ref 0.7–4.0)
MCH: 29.1 pg (ref 26.0–34.0)
MCHC: 33 g/dL (ref 30.0–36.0)
MCV: 88.3 fL (ref 80.0–100.0)
Monocytes Absolute: 0.8 10*3/uL (ref 0.1–1.0)
Monocytes Relative: 13 %
Neutro Abs: 3.5 10*3/uL (ref 1.7–7.7)
Neutrophils Relative %: 55 %
Platelets: 255 10*3/uL (ref 150–400)
RBC: 3.09 MIL/uL — ABNORMAL LOW (ref 3.87–5.11)
RDW: 14.5 % (ref 11.5–15.5)
WBC: 6.3 10*3/uL (ref 4.0–10.5)
nRBC: 0 % (ref 0.0–0.2)

## 2020-12-30 LAB — BASIC METABOLIC PANEL
Anion gap: 6 (ref 5–15)
BUN: 66 mg/dL — ABNORMAL HIGH (ref 8–23)
CO2: 22 mmol/L (ref 22–32)
Calcium: 8.5 mg/dL — ABNORMAL LOW (ref 8.9–10.3)
Chloride: 110 mmol/L (ref 98–111)
Creatinine, Ser: 3.7 mg/dL — ABNORMAL HIGH (ref 0.44–1.00)
GFR, Estimated: 13 mL/min — ABNORMAL LOW (ref >60)
Glucose, Bld: 169 mg/dL — ABNORMAL HIGH (ref 70–99)
Potassium: 4.6 mmol/L (ref 3.5–5.1)
Sodium: 138 mmol/L (ref 135–145)

## 2020-12-30 LAB — ANA W/REFLEX IF POSITIVE: Anti Nuclear Antibody (ANA): NEGATIVE

## 2020-12-30 LAB — GLUCOSE, CAPILLARY
Glucose-Capillary: 151 mg/dL — ABNORMAL HIGH (ref 70–99)
Glucose-Capillary: 252 mg/dL — ABNORMAL HIGH (ref 70–99)
Glucose-Capillary: 250 mg/dL — ABNORMAL HIGH (ref 70–99)
Glucose-Capillary: 269 mg/dL — ABNORMAL HIGH (ref 70–99)

## 2020-12-30 LAB — C3 COMPLEMENT: C3 Complement: 89 mg/dL (ref 82–167)

## 2020-12-30 LAB — GLOMERULAR BASEMENT MEMBRANE ANTIBODIES: GBM Ab: 4 units (ref 0–20)

## 2020-12-30 MED ORDER — CHLORHEXIDINE GLUCONATE CLOTH 2 % EX PADS
6.0000 | MEDICATED_PAD | Freq: Every day | CUTANEOUS | Status: DC
  Administered 2020-12-31 – 2021-01-19 (×19): 6 via TOPICAL

## 2020-12-30 NOTE — Progress Notes (Signed)
PROGRESS NOTE    Susan House  WUJ:[id number] DOB: Jun 21, 1956 DOA: 12/14/2020 PCP: Jerrol Banana., MD   Brief Narrative:  65 y.o. female with medical history significant for history of CVA with residual left-sided weakness, left frontal encephalomalacia, CKD stage IIIb-IV, insulin-dependent type 2 diabetes, hypertension, hyperlipidemia, seizure disorder, and memory loss who presents to the ED for evaluation of generalized weakness and abnormal labs.   Patient recently admitted 11/28/2020-12/03/2020 for AKI on CKD managed with IV fluid hydration.  Renal ultrasound showed increased echogenicity compatible with medical renal disease and a 2.7 cm right kidney cyst.  She was discharged to SNF.   Patient states that she was sent to the ED due to abnormal labs.  Found to have AKI on CKD and evidence of urinary tract infection.   Kidney function appears to have peaked at 3.44.  Now slightly downtrending.  Mental status also slowly improving.  Mental status has been waxing and waning.  Discussed this with the patient's daughter.  I feel this is a progression of underlying vascular dementia and patient if may be established a new psychiatric baseline.   Unfortunately blood pressure has been very difficult to control.  Intravenous fluids were discontinued on 6/5 and patient had subsequent deterioration kidney function 6/6.  Nephrology following.   6/7-6/13: Renal function continues to get worse although still making urine so nephrology not ready for dialysis yet but not able to discharge either as her kidney function has not returned to baseline.  She required 1 PRBC transfusion on 6/12 for hemoglobin of 6.9 with appropriate response  6/14: Blood pressure improved control over interval though still not ideal.  Kidney function slowly improving.  Fluids restarted 6/13.  6/15: Kidney function is deteriorating despite gentle IV fluid resuscitation.  Case discussed with nephrology.  Lengthy  conversation with the husband and patient at bedside.  Likely that hemodialysis would be indicated.  Still questionable whether this is within the patient's and family's goals of care. 6/16: Had a lengthy conversation with the patient at bedside this morning.  I explained that hemodialysis was recommended given her deteriorating kidney function and lack of appreciable improvement.  After nephrology follow-up patient now agrees to have PermCath placed   Assessment & Plan:   Principal Problem:   Acute kidney injury superimposed on chronic kidney disease ([place]) Active Problems:   Hypertension associated with diabetes ([place])   Type 2 diabetes mellitus with complications ([place])   Hemiparesis due to old cerebrovascular accident ([place])   Hyperlipidemia associated with type 2 diabetes mellitus ([place])   Hx of completed stroke   AKI (acute kidney injury) ([place])   Protein-calorie malnutrition, severe  AKI on CKD stage IIIb Recently admitted for the same. Renal function worsened today Similar to previous presentation likely etiology is ATN in the setting of poorly controlled hypertension as well as possible relative dehydration/weight depletion  Nephrology following.   Renal ultrasound with Doppler rule showed 10.8 x 5.6 cm cystic mass on the right side.  CT abdomen pelvis did not show this mass.  Showed some colitis but she is not having any symptoms of colitis so we will hold off treatment.  Also diverticulosis but no diverticulitis.  Creat: 3.45-> 4.13->3.86-> 4.27-->3.85-->4.05  Plan: Per nephrology patient will likely require hemodialysis.  She has had deterioration of kidney function despite fluid resuscitation and improved control of blood pressure.  Likely etiology of renal function decline secondary to longstanding refractory hypertension and diabetes. -Nephrology to follow-up with patient and  family regarding decision of dialysis.  If patient elects not to proceed with dialysis then referral to  palliative care or hospice may be appropriate.  As of 6/16 patient is now agreeable to PermCath placement and initiation of hemodialysis. -We will still consult palliative care to coordinate with family and establish clear goals of care   Refractory hypertension  Continue amlodipine, clonidine, hydralazine metoprolol and Imdur.  Holding Vasotec per nephrology   Urinary tract infection Completed 3 days of Rocephin.   Anemia of chronic kidney disease and B12 deficiency Hemoglobin 6.9 on 6/13 - s/p 1 PRBC for transfusion and Hb 8.8  Patient denies any obvious bleeding.  Continue B12 supplement and monitor for any signs/symptoms of bleeding.   Insulin-dependent type 2 diabetes A1c 8.6% Lantus 10 units nightly NovoLog 3 units 3 times daily with meals Moderate sliding scale Carb modified diet   History of CVA with residual left-sided weakness/left frontal encephalomalacia Continue aspirin, Plavix, atorvastatin.   Seizure disorder Continue Keppra Seizure precaution   Memory loss Suspected underlying vascular dementia.   She had some reported confusion/delirium prior to arrival.   Baseline level of mentation is unclear CT head significant for chronic volume loss and small vessel ischemic changes consistent with suspected vascular dementia Outpatient follow-up   Constipation Continue Senokot-S and MiraLAX   DVT prophylaxis: SQ heparin Code Status: DNR Family Communication: Daughter [name] [phone] on 6/16 Disposition Plan: Status is: Inpatient  Remains inpatient appropriate because:Inpatient level of care appropriate due to severity of illness  Dispo: The patient is from: SNF              Anticipated d/c is to: SNF              Patient currently is not medically stable to d/c.   Difficult to place patient No  Kidney function continues to deteriorate despite intravenous fluid resuscitation.  Patient now agreeable to PermCath placement and initiation of  hemodialysis.     Level of care: Med-Surg  Consultants:  Nephrology  Procedures:  None  Antimicrobials:  None   Subjective: Patient seen and examined.  Continues to express desire to go home.  No visible distress  Objective: Vitals:   12/30/20 0346 12/30/20 0348 12/30/20 0742 12/30/20 0946  BP:  (!) 159/58 (!) 180/66 (!) 169/63  Pulse:  (!) 55 62   Resp:  18 15   Temp:  98 F (36.7 C) 97.9 F (36.6 C)   TempSrc:   Oral   SpO2:  98% 99%   Weight: 93.4 kg     Height:        Intake/Output Summary (Last 24 hours) at 12/30/2020 1205 Last data filed at 12/30/2020 1019 Gross per 24 hour  Intake 720 ml  Output 1350 ml  Net -630 ml   Filed Weights   12/27/20 0501 12/28/20 0426 12/30/20 0346  Weight: 91.1 kg 92.4 kg 93.4 kg    Examination:  General exam: No acute distress Respiratory system: Clear to auscultation. Respiratory effort normal. Cardiovascular system: S1 & S2 heard, RRR. No JVD, murmurs, rubs, gallops or clicks. No pedal edema. Gastrointestinal system: Abdomen is nondistended, soft and nontender. No organomegaly or masses felt. Normal bowel sounds heard. Central nervous system: Alert, oriented x2, no focal deficits Extremities: Symmetric 5 x 5 power. Skin: No rashes, lesions or ulcers Psychiatry: Judgement and insight appear impaired. Mood & affect flattened.     Data Reviewed: I have personally reviewed following labs and imaging studies  CBC: Recent Labs  Lab 12/26/20 0426 12/27/20 0359 12/28/20 0620 12/29/20 0629 12/30/20 0540  WBC 5.9 7.2 5.3 5.2 6.3  NEUTROABS 3.5 4.9 2.6 2.6 3.5  HGB 6.9* 8.8* 9.1* 8.9* 9.0*  HCT 21.0* 26.3* 27.8* 26.2* 27.3*  MCV 86.8 86.5 88.0 86.8 88.3  PLT 242 242 249 261 202   Basic Metabolic Panel: Recent Labs  Lab 12/26/20 0426 12/27/20 0359 12/28/20 0620 12/29/20 0629 12/30/20 0540  NA 139 139 139 138 138  K 4.8 4.9 4.6 4.7 4.6  CL 110 108 108 107 110  CO2 22 22 21* 20* 22  GLUCOSE 138* 186* 161*  233* 169*  BUN 64* 74* 70* 66* 66*  CREATININE 3.86* 4.27* 3.85* 4.05* 3.70*  CALCIUM 8.4* 8.3* 8.7* 8.4* 8.5*   GFR: Estimated Creatinine Clearance: 16.3 mL/min (A) (by C-G formula based on SCr of 3.7 mg/dL (H)). Liver Function Tests: No results for input(s): AST, ALT, ALKPHOS, BILITOT, PROT, ALBUMIN in the last 168 hours. No results for input(s): LIPASE, AMYLASE in the last 168 hours. No results for input(s): AMMONIA in the last 168 hours. Coagulation Profile: No results for input(s): INR, PROTIME in the last 168 hours. Cardiac Enzymes: No results for input(s): CKTOTAL, CKMB, CKMBINDEX, TROPONINI in the last 168 hours. BNP (last 3 results) No results for input(s): PROBNP in the last 8760 hours. HbA1C: No results for input(s): HGBA1C in the last 72 hours. CBG: Recent Labs  Lab 12/29/20 1128 12/29/20 1555 12/29/20 2153 12/30/20 0726 12/30/20 1130  GLUCAP 295* 285* 168* 151* 252*   Lipid Profile: No results for input(s): CHOL, HDL, LDLCALC, TRIG, CHOLHDL, LDLDIRECT in the last 72 hours. Thyroid Function Tests: No results for input(s): TSH, T4TOTAL, FREET4, T3FREE, THYROIDAB in the last 72 hours. Anemia Panel: No results for input(s): VITAMINB12, FOLATE, FERRITIN, TIBC, IRON, RETICCTPCT in the last 72 hours. Sepsis Labs: No results for input(s): PROCALCITON, LATICACIDVEN in the last 168 hours.  No results found for this or any previous visit (from the past 240 hour(s)).        Radiology Studies: No results found.      Scheduled Meds:  amLODipine  10 mg Oral Daily   aspirin  81 mg Oral Daily   atorvastatin  20 mg Oral QHS   cloNIDine  0.3 mg Oral BID   clopidogrel  75 mg Oral Daily   gabapentin  100 mg Oral BID   heparin  5,000 Units Subcutaneous Q8H   hydrALAZINE  100 mg Oral Q6H   insulin aspart  0-15 Units Subcutaneous TID WC   insulin aspart  3 Units Subcutaneous TID WC   insulin glargine  10 Units Subcutaneous QHS   isosorbide mononitrate  60 mg Oral  Daily   levETIRAcetam  500 mg Oral Q24H   metoprolol tartrate  50 mg Oral BID   multivitamin with minerals  1 tablet Oral Daily   nystatin  1 application Topical BID   pantoprazole  40 mg Oral Daily   polyethylene glycol  17 g Oral Daily   senna-docusate  2 tablet Oral BID   vitamin B-12  250 mcg Oral Daily   Continuous Infusions:  sodium chloride 50 mL/hr at 12/28/20 2331   sodium chloride       LOS: 14 days    Time spent: 15 minutes    Sidney Ace, MD [facility] Pager [phone]  If 7PM-7AM, please contact night-coverage  12/30/2020, 12:05 PM

## 2020-12-30 NOTE — Progress Notes (Signed)
Occupational Therapy Treatment Patient Details Name: Susan House MRN: [id number] DOB: [birth year] Today's Date: 12/30/2020    History of present illness Pt is a 65 y.o. female with medical history significant for history of CVA with residual left-sided weakness, left frontal encephalomalacia, CKD stage IIIb-IV, insulin-dependent type 2 diabetes, hypertension, hyperlipidemia, seizure disorder, and memory loss who presents to the ED for evaluation of generalized weakness and abnormal labs, potential UTI. Patient recently admitted 11/28/2020-12/03/2020 for AKI on CKD, discharged to SNF.   OT comments  Upon entering the room, pt seated in recliner chair with family present in the room. Pt has been sitting up in chair ~ 3 hours today. OT turning recliner chair for set up in order to be able to drop down recliner chair arm rest for safety with transfer. Pt very fatigued at this point and needing total A for anterior weight shift and squat pivot transfer. Bed was uphill from recliner chair and pt giving very little effort to assist with transfer secondary to fatigue. Total for sit >supine for management of trunk and B LE back to bed. Repositioning for comfort and L resting hand splint donned. All needs within reach and pt remained in bed with family present.   Follow Up Recommendations  SNF;Supervision/Assistance - 24 hour    Equipment Recommendations  Other (comment) (defer to next venue of care)       Precautions / Restrictions Precautions Precautions: Fall Restrictions Weight Bearing Restrictions: No       Mobility Bed Mobility Overal bed mobility: Needs Assistance Bed Mobility: Sit to Supine     Supine to sit: Total assist     General bed mobility comments: very fatigued and needing increased assistance for safety    Transfers Overall transfer level: Needs assistance Equipment used: None Transfers: Squat Pivot Transfers     Squat pivot transfers: Total assist      General transfer comment: Transferred towards the L side with recliner arm rest down and use of chux pad for total A transfer back to bed    Balance Overall balance assessment: Needs assistance Sitting-balance support: Feet supported Sitting balance-Leahy Scale: Poor Sitting balance - Comments: Pt required CGA for safety while sitting EOB with feet support and UE support   Standing balance support: Bilateral upper extremity supported;During functional activity Standing balance-Leahy Scale: Poor Standing balance comment: Extremely high fall risk. Poor strength and activity tolerance.                           ADL either performed or assessed with clinical judgement     Vision Patient Visual Report: No change from baseline            Cognition Arousal/Alertness: Awake/alert Behavior During Therapy: WFL for tasks assessed/performed Overall Cognitive Status: History of cognitive impairments - at baseline                                 General Comments: eyes open but pt is very fatigued                   Pertinent Vitals/ Pain       Pain Assessment: Faces Faces Pain Scale: Hurts little more Pain Location: generalized "all over" Pain Descriptors / Indicators: Sore Pain Intervention(s): Monitored during session;Repositioned   Frequency  Min 2X/week        Progress Toward Goals  OT  Goals(current goals can now be found in the care plan section)  Progress towards OT goals: Progressing toward goals  Acute Rehab OT Goals Patient Stated Goal: To get stronger OT Goal Formulation: With patient Time For Goal Achievement: 12/30/20 Potential to Achieve Goals: [place] Discharge plan remains appropriate;Frequency remains appropriate    Co-evaluation                 AM-PAC OT "6 Clicks" Daily Activity     Outcome Measure   Help from another person eating meals?: A Little Help from another person taking care of personal grooming?:  A Little Help from another person toileting, which includes using toliet, bedpan, or urinal?: Total Help from another person bathing (including washing, rinsing, drying)?: A Lot   Help from another person to put on and taking off regular lower body clothing?: A Lot 6 Click Score: 11    End of Session    OT Visit Diagnosis: Unsteadiness on feet (R26.81);Muscle weakness (generalized) (M62.81)   Activity Tolerance Patient limited by fatigue   Patient Left in bed;with call bell/phone within reach;with bed alarm set;with family/visitor present   Nurse Communication Mobility status        Time: 6160-7371 OT Time Calculation (min): 25 min  Charges: OT General Charges $OT Visit: 1 Visit OT Evaluation $OT Re-eval: 1 Re-eval OT Treatments $Self Care/Home Management : 23-37 mins  Darleen Crocker, MS, OTR/L , CBIS ascom [phone]  12/30/20, 4:46 PM

## 2020-12-30 NOTE — Progress Notes (Signed)
Physical Therapy Treatment Patient Details Name: Susan House MRN: [id number] DOB: October 07, 1955 Today's Date: 12/30/2020    History of Present Illness Pt is a 65 y.o. female with medical history significant for history of CVA with residual left-sided weakness, left frontal encephalomalacia, CKD stage IIIb-IV, insulin-dependent type 2 diabetes, hypertension, hyperlipidemia, seizure disorder, and memory loss who presents to the ED for evaluation of generalized weakness and abnormal labs, potential UTI. Patient recently admitted 11/28/2020-12/03/2020 for AKI on CKD, discharged to SNF.    PT Comments    Patient received in bed, she is alert this date, husband present.  Patient is agreeable to try to get to recliner. Requires mod assist with bed mobility. Transfers bed to recliner with max assist of +1-2 ( husband assisted some). Patient is fatigued with mobility and requires increased time and encouragement. Patient will continue to benefit from skilled PT while here to improve mobility and strength.        Follow Up Recommendations  SNF     Equipment Recommendations  None recommended by PT;Other (comment) (TBD)    Recommendations for Other Services       Precautions / Restrictions Precautions Precautions: Fall Restrictions Weight Bearing Restrictions: No    Mobility  Bed Mobility Overal bed mobility: Needs Assistance Bed Mobility: Supine to Sit     Supine to sit: Mod assist     General bed mobility comments: patient attempts to assist using R side to pull and push, due to left side weakness she requires min/mod assist for getting seated upright at bedside.    Transfers Overall transfer level: Needs assistance Equipment used: None Transfers: Squat Pivot Transfers     Squat pivot transfers: Max assist        Ambulation/Gait             General Gait Details: unable/unsafe   Stairs             Wheelchair Mobility    Modified Rankin (Stroke  Patients Only)       Balance Overall balance assessment: Needs assistance Sitting-balance support: Feet supported Sitting balance-Leahy Scale: Fair Sitting balance - Comments: Pt required CGA for safety while sitting EOB with feet support and UE support   Standing balance support: Bilateral upper extremity supported;During functional activity Standing balance-Leahy Scale: Poor Standing balance comment: Extremely high fall risk. Poor strength and activity tolerance.                            Cognition Arousal/Alertness: Awake/alert Behavior During Therapy: WFL for tasks assessed/performed Overall Cognitive Status: History of cognitive impairments - at baseline                                 General Comments: decreased initiation, motivation      Exercises      General Comments        Pertinent Vitals/Pain Pain Assessment: Faces Faces Pain Scale: Hurts little more Pain Location: R foot with transfers Pain Descriptors / Indicators: Sore Pain Intervention(s): Monitored during session;Repositioned    Home Living                      Prior Function            PT Goals (current goals can now be found in the care plan section) Acute Rehab PT Goals Patient Stated Goal: To get  stronger PT Goal Formulation: With patient Time For Goal Achievement: 01/11/21 Potential to Achieve Goals: Fair Progress towards PT goals: Progressing toward goals    Frequency    Min 2X/week      PT Plan Current plan remains appropriate    Co-evaluation              AM-PAC PT "6 Clicks" Mobility   Outcome Measure  Help needed turning from your back to your side while in a flat bed without using bedrails?: A Lot Help needed moving from lying on your back to sitting on the side of a flat bed without using bedrails?: A Lot Help needed moving to and from a bed to a chair (including a wheelchair)?: A Lot Help needed standing up from a chair using  your arms (e.g., wheelchair or bedside chair)?: Total Help needed to walk in hospital room?: Total Help needed climbing 3-5 steps with a railing? : Total 6 Click Score: 9    End of Session Equipment Utilized During Treatment: Gait belt Activity Tolerance: Patient limited by fatigue;Patient limited by lethargy Patient left: in chair;with chair alarm set;with family/visitor present;Other (comment) (MD in room) Nurse Communication: Mobility status PT Visit Diagnosis: Other abnormalities of gait and mobility (R26.89);Muscle weakness (generalized) (M62.81)     Time: 0300-9233 PT Time Calculation (min) (ACUTE ONLY): 18 min  Charges:  $Therapeutic Activity: 8-22 mins                    Susan House, PT, GCS 12/30/20,1:33 PM

## 2020-12-30 NOTE — Progress Notes (Signed)
[facility]  ROUNDING NOTE   Subjective:   Susan House is a 65 y.o. female with medical conditions including CVA with left sided weakness, Type 2 diabetes, hyperlipidemia, seizures, hypertension and CKD 3b. Patient presents ED with weakness and abnormal labs.   Patient resting in bed Alert and oriented x 2 Denies nausea Denies shortness of breath   Objective:  Vital signs in last 24 hours:  Temp:  [97.9 F (36.6 C)-98.2 F (36.8 C)] 98.2 F (36.8 C) (06/16 1210) Pulse Rate:  [55-66] 66 (06/16 1210) Resp:  [15-18] 18 (06/16 1210) BP: (149-180)/(57-66) 149/59 (06/16 1210) SpO2:  [98 %-100 %] 98 % (06/16 1210) Weight:  [93.4 kg] 93.4 kg (06/16 0346)  Weight change:  Filed Weights   12/27/20 0501 12/28/20 0426 12/30/20 0346  Weight: 91.1 kg 92.4 kg 93.4 kg    Intake/Output: I/O last 3 completed shifts: In: 840 [P.O.:840] Out: 2000 [Urine:2000]   Intake/Output this shift:  Total I/O In: 600 [P.O.:600] Out: 350 [Urine:350]  Physical Exam: General: Resting in bed  Head: Normocephalic, atraumatic.moist oral mucosal membranes  Eyes: Anicteric  Lungs:  Clear bilaterally, normal respiratory effort  Heart: S1S2,no rubs or gallops  Abdomen:  Soft, nontender, non distended  Extremities:  trace peripheral edema.  Neurologic:  left sided weakness +, Oriented x 2  Skin: No acute lesions or rashes       Basic Metabolic Panel: Recent Labs  Lab 12/26/20 0426 12/27/20 0359 12/28/20 0620 12/29/20 0629 12/30/20 0540  NA 139 139 139 138 138  K 4.8 4.9 4.6 4.7 4.6  CL 110 108 108 107 110  CO2 22 22 21* 20* 22  GLUCOSE 138* 186* 161* 233* 169*  BUN 64* 74* 70* 66* 66*  CREATININE 3.86* 4.27* 3.85* 4.05* 3.70*  CALCIUM 8.4* 8.3* 8.7* 8.4* 8.5*     Liver Function Tests: No results for input(s): AST, ALT, ALKPHOS, BILITOT, PROT, ALBUMIN in the last 168 hours. No results for input(s): LIPASE, AMYLASE in the last 168 hours. No results for input(s):  AMMONIA in the last 168 hours.  CBC: Recent Labs  Lab 12/26/20 0426 12/27/20 0359 12/28/20 0620 12/29/20 0629 12/30/20 0540  WBC 5.9 7.2 5.3 5.2 6.3  NEUTROABS 3.5 4.9 2.6 2.6 3.5  HGB 6.9* 8.8* 9.1* 8.9* 9.0*  HCT 21.0* 26.3* 27.8* 26.2* 27.3*  MCV 86.8 86.5 88.0 86.8 88.3  PLT 242 242 249 261 255     Cardiac Enzymes: No results for input(s): CKTOTAL, CKMB, CKMBINDEX, TROPONINI in the last 168 hours.  BNP: Invalid input(s): POCBNP  CBG: Recent Labs  Lab 12/29/20 1128 12/29/20 1555 12/29/20 2153 12/30/20 0726 12/30/20 1130  GLUCAP 295* 285* 168* 151* 252*     Microbiology: Results for orders placed or performed during the hospital encounter of 12/14/20  SARS CORONAVIRUS 2 (TAT 6-24 HRS) Nasopharyngeal Nasopharyngeal Swab     Status: None   Collection Time: 12/14/20  8:28 PM   Specimen: Nasopharyngeal Swab  Result Value Ref Range Status   SARS Coronavirus 2 NEGATIVE NEGATIVE Final    Comment: (NOTE) SARS-CoV-2 target nucleic acids are NOT DETECTED.  The SARS-CoV-2 RNA is generally detectable in upper and lower respiratory specimens during the acute phase of infection. Negative results do not preclude SARS-CoV-2 infection, do not rule out co-infections with other pathogens, and should not be used as the sole basis for treatment or other patient management decisions. Negative results must be combined with clinical observations, patient history, and epidemiological information. The  expected result is Negative.  Fact Sheet for Patients: SugarRoll.be  Fact Sheet for Healthcare Providers: https://www.woods-mathews.com/  This test is not yet approved or cleared by the [place] FDA and  has been authorized for detection and/or diagnosis of SARS-CoV-2 by FDA under an Emergency Use Authorization (EUA). This EUA will remain  in effect (meaning this test can be used) for the duration of the COVID-19 declaration under Se  ction 564(b)(1) of the Act, 21 U.S.C. section 360bbb-3(b)(1), unless the authorization is terminated or revoked sooner.  Performed at [facility], [place] [address], [place], [place] [address]   SARS CORONAVIRUS 2 (TAT 6-24 HRS) Nasopharyngeal Nasopharyngeal Swab     Status: None   Collection Time: 12/19/20  4:40 PM   Specimen: Nasopharyngeal Swab  Result Value Ref Range Status   SARS Coronavirus 2 NEGATIVE NEGATIVE Final    Comment: (NOTE) SARS-CoV-2 target nucleic acids are NOT DETECTED.  The SARS-CoV-2 RNA is generally detectable in upper and lower respiratory specimens during the acute phase of infection. Negative results do not preclude SARS-CoV-2 infection, do not rule out co-infections with other pathogens, and should not be used as the sole basis for treatment or other patient management decisions. Negative results must be combined with clinical observations, patient history, and epidemiological information. The expected result is Negative.  Fact Sheet for Patients: SugarRoll.be  Fact Sheet for Healthcare Providers: https://www.woods-mathews.com/  This test is not yet approved or cleared by the [place] FDA and  has been authorized for detection and/or diagnosis of SARS-CoV-2 by FDA under an Emergency Use Authorization (EUA). This EUA will remain  in effect (meaning this test can be used) for the duration of the COVID-19 declaration under Se ction 564(b)(1) of the Act, 21 U.S.C. section 360bbb-3(b)(1), unless the authorization is terminated or revoked sooner.  Performed at [facility], [place] [address] [place], [place] [address]     Coagulation Studies: No results for input(s): LABPROT, INR in the last 72 hours.  Urinalysis: No results for input(s): COLORURINE, LABSPEC, PHURINE, GLUCOSEU, HGBUR, BILIRUBINUR, KETONESUR, PROTEINUR, UROBILINOGEN, NITRITE, LEUKOCYTESUR in the last 72 hours.  Invalid input(s):  APPERANCEUR    Imaging: No results found.   Medications:    sodium chloride 50 mL/hr at 12/28/20 2331   sodium chloride      amLODipine  10 mg Oral Daily   aspirin  81 mg Oral Daily   atorvastatin  20 mg Oral QHS   cloNIDine  0.3 mg Oral BID   clopidogrel  75 mg Oral Daily   gabapentin  100 mg Oral BID   heparin  5,000 Units Subcutaneous Q8H   hydrALAZINE  100 mg Oral Q6H   insulin aspart  0-15 Units Subcutaneous TID WC   insulin aspart  3 Units Subcutaneous TID WC   insulin glargine  10 Units Subcutaneous QHS   isosorbide mononitrate  60 mg Oral Daily   levETIRAcetam  500 mg Oral Q24H   metoprolol tartrate  50 mg Oral BID   multivitamin with minerals  1 tablet Oral Daily   nystatin  1 application Topical BID   pantoprazole  40 mg Oral Daily   polyethylene glycol  17 g Oral Daily   senna-docusate  2 tablet Oral BID   vitamin B-12  250 mcg Oral Daily   acetaminophen **OR** acetaminophen, HYDROcodone-acetaminophen, hydrocortisone, iohexol, ondansetron **OR** ondansetron (ZOFRAN) IV, senna-docusate  Assessment/ Plan:  [name] is a 65 y.o.  female who was  admitted to [facility] on 11/28/2020 for AKI on CKD. Patient has previous medical conditions including diabetes, hypertension, CVA with left hemiplegia residual, morbid obesity and chronic kidney disease stage 3B. She presents to the ED with weakness and abnormal labs.    Acute Kidney Injury on chronic kidney disease stage 3B with baseline creatinine 2.32 and GFR of 23 on 12/03/20.  Acute kidney injury secondary to ATN due to poor intake vs uncontrolled hypertension Chronic kidney disease is secondary to uncontrolled diabetes Renal function worse today UOP 1 L for the preceding 24 hours , via [place]. Continue IVF 20m/hr  Discussed with patient and husband that though renal function improved slightly, overall still decreased. eGFR is 13 and mentation varies. We feel that dialysis would be the treatment of choice  to improve current situation.  Patient has decided to proceed with HD cath placement and initiate dialysis Contacted vascular about permcath placement tomorrow Will make NPO after midnight After placement, will perform dialysis Will continue to monitor  Lab Results  Component Value Date   CREATININE 3.70 (H) 12/30/2020   CREATININE 4.05 (H) 12/29/2020   CREATININE 3.85 (H) 12/28/2020    Intake/Output Summary (Last 24 hours) at 12/30/2020 1500 Last data filed at 12/30/2020 1349 Gross per 24 hour  Intake 720 ml  Output 1350 ml  Net -630 ml     2. Hypertension:  BP elevated Currently on Amlodipine, Clonidine, Hydralazine, Isosorbide mononitrate, and metoprolol.   3. Diabetes mellitus type II with chronic kidney disease Hgb A1c 8.6 on 11/29/20 Elevated  Glucose monitoring by primary team  4. Anemia of CKD Lab Results  Component Value Date   HGB 9.0 (L) 12/30/2020  Hgb below target Will monitor   LOS: 1[place]6/16/20223:00 PM

## 2020-12-30 NOTE — Consult Note (Signed)
Consultation Note Date: 12/30/2020   Patient Name: Susan House  DOB: 10/11/1955  MRN: [id number]  Age / Sex: 65 y.o., female  PCP: Jerrol Banana., MD Referring Physician: Sidney Ace, MD  Reason for Consultation: Establishing goals of care and Psychosocial/spiritual support  HPI/Patient Profile: 65 y.o. female  with past medical history of CVA -L weakness, left frontal encephalomalacia, CKD 3-4, IDDM2, HTN/HLD, seizure disorder, and memory loss admitted on 12/14/2020 with acute on chronic kidney failure.   Clinical Assessment and Goals of Care: I have reviewed medical records including EPIC notes, labs and imaging, received report from RN, assessed the patient and then met at the bedside along with spouse, Susan House and daughter Susan House to discuss diagnosis prognosis, GOC, EOL wishes, disposition and options.   I introduced Palliative Medicine as specialized medical care for people living with serious illness. It focuses on providing relief from the symptoms and stress of a serious illness. The goal is to improve quality of life for both the patient and the family.  [name], Susan House, is lying quietly in bed.  She appears acutely/chronically ill and quite frail.  She will make an mostly keep eye contact, and is alert to person and situation.  She is able to make her basic needs known.  We talked about Susan House's decision to try hemodialysis.  She asks me when she could stop.  I shared that she can stop at any point she so desires, but encouraged her to understand that most people who are dependent on hemodialysis only live a few weeks when they stop.  Thankfully, daughter Susan House is at bedside.  She asks appropriate questions.  We talked about the rigors of dialysis.  I shared that Susan House will have limited sessions while in the hospital, and will be set up with outpatient dialysis center  where she must attend 3 days a week for 4-hour sessions without fail.  We talked about some logistics.    Husband and daughter shared that they can abide Susan House's wish if she does not want to continue dialysis.  Unfortunately, her abilities may be hampered by her inability to sit on a chair for extended periods of time.  Susan House states that there has been a decline over the last 6 months.  She shares that Susan House spends most of her day either in the bed or in the chair.  She shares that she can sometimes walk up to 6 feet from the bed to the chair, but other times she must stop and rest.  Husband Susan House shares that his father had hemodialysis for several years, he died from infection, not from stopping dialysis.  Susan House endorses that her grandfathers health and mobility was better than [name] is now.  Advanced directives, concepts specific to code status, were considered and discussed.  Susan House elects to "treat the treatable, but allowing natural passing".  Family states they can abide this wish.  Palliative Care services outpatient were explained and offered.  Patient and family open to outpatient palliative services.  Provider choice offered, they choose [facility].  Discussed the importance of continued conversation with family and the medical providers regarding overall plan of care and treatment options, ensuring decisions are within the context of the patient's values and GOCs.   Questions and concerns were addressed.  Patient and family was encouraged to call with questions or concerns.  PMT will continue to support holistically.  Conference with attending, bedside nursing staff, transition of care team related to patient condition, needs, goals of care.    HCPOA   HCPOA -main healthcare surrogate is daughter, Susan House.    SUMMARY OF RECOMMENDATIONS   At this point continue to treat the treatable but no CPR or intubation Agreeable to trial of HD Outpatient palliative services with  [facility]   Code Status/Advance Care Planning: DNR  Symptom Management:  Per hospitalist, no additional needs at this time.  Palliative Prophylaxis:  Frequent Pain Assessment and Oral Care  Additional Recommendations (Limitations, Scope, Preferences): Continue to treat the treatable but no CPR or intubation  Psycho-social/Spiritual:  Desire for further Chaplaincy support:no Additional Recommendations: Caregiving  Support/Resources and Education on Hospice  Prognosis:  Unable to determine, based on outcomes.  Guarded as she will be new to HD.  Discharge Planning:  To be determined, based on outcomes.       Primary Diagnoses: Present on Admission:  Acute kidney injury superimposed on chronic kidney disease (HCC)  Type 2 diabetes mellitus with complications (HCC)  Hypertension associated with diabetes ([place])  Hyperlipidemia associated with type 2 diabetes mellitus ([place])  AKI (acute kidney injury) ([place])   I have reviewed the medical record, interviewed the patient and family, and examined the patient. The following aspects are pertinent.  Past Medical History:  Diagnosis Date   Allergy    Arthritis    Atypical chest pain    a. Reportedly nl cath in the past; b. 12/2009 MV: No ischemia/infarct. EF 87%.   Carotid arterial disease ([place])    a. 12/3873 CTA head/neck: LICA 64P, RICA 32-->RJJ rx.   Cellulitis and abscess of face    Chronic Dizziness    Coronary artery calcification seen on CT scan    a. 10/2016 CTA chest: Atherosclerotic calcifications Ao, cors, and prox great vessels.   Depression    Diabetes mellitus ([place])    Diastolic dysfunction    a. 2008 Echo: Nl EF; b. 2011 Echo: Nl EF; c. 06/2016 Echo: EF 55-60%, Gr1DD; d. 03/2018 Echo: EF 55-60%, no rwma, Gr1DD, mod dil LA.   Edema    GERD (gastroesophageal reflux disease)    Headache    Hematuria    Hyperlipidemia    Hypertension    IBS (irritable bowel syndrome)    Migraine    Morbid obesity ([place])     Nocturia    Possible Seizure ([place])    a. 10/2016 ? sz. MRI neg for stroke.   Stroke [facility]) 2000   TIA (transient ischemic attack) 2010 & 2019   a. 2010; b. 03/2018 Left sided wkns-->MRI neg for stroke. CTA head/neck: Right A2 cut off/occlusion w/ collats extending over R cerebral convexity. LICA 88C, RICA 45, RSubclavian 50->med rx.   Urgency of micturation    Urinary frequency    Urinary incontinence    Social History   Socioeconomic History   Marital status: Married    Spouse name: Susan House    Number of children: 2   Years of education: 15   Highest education level: Not on file  Occupational History   Occupation:  Disabled    Occupation: retired  Tobacco Use   Smoking status: Never   Smokeless tobacco: Never  Vaping Use   Vaping Use: Never used  Substance and Sexual Activity   Alcohol use: No   Drug use: No   Sexual activity: Never  Other Topics Concern   Not on file  Social History Narrative   Patient lives in [place] at home with husband Susan House.    Patient has 2 children and 2 step children.    Patient has 12+ years of education.    Patient is Disabled since stroke in 2000.   Social Determinants of Health   Financial Resource Strain: Not on file  Food Insecurity: Not on file  Transportation Needs: Not on file  Physical Activity: Not on file  Stress: Not on file  Social Connections: Not on file   Family History  Problem Relation Age of Onset   Hyperlipidemia Mother    Arrhythmia Mother        WPW   Hypertension Mother    Rheum arthritis Mother    Heart disease Mother    Heart attack Father 86   Hyperlipidemia Father    Hypertension Father    Stroke Father    Diabetes Father    Heart disease Father    Coronary artery disease Father    Diabetes Sister    Hyperlipidemia Sister    Hypertension Sister    Depression Sister    Depression Sister    Diabetes Sister    Hypertension Sister    Hyperlipidemia Sister    Kidney disease Paternal Grandmother     Scheduled Meds:  amLODipine  10 mg Oral Daily   aspirin  81 mg Oral Daily   atorvastatin  20 mg Oral QHS   cloNIDine  0.3 mg Oral BID   clopidogrel  75 mg Oral Daily   gabapentin  100 mg Oral BID   heparin  5,000 Units Subcutaneous Q8H   hydrALAZINE  100 mg Oral Q6H   insulin aspart  0-15 Units Subcutaneous TID WC   insulin aspart  3 Units Subcutaneous TID WC   insulin glargine  10 Units Subcutaneous QHS   isosorbide mononitrate  60 mg Oral Daily   levETIRAcetam  500 mg Oral Q24H   metoprolol tartrate  50 mg Oral BID   multivitamin with minerals  1 tablet Oral Daily   nystatin  1 application Topical BID   pantoprazole  40 mg Oral Daily   polyethylene glycol  17 g Oral Daily   senna-docusate  2 tablet Oral BID   vitamin B-12  250 mcg Oral Daily   Continuous Infusions:  sodium chloride 50 mL/hr at 12/28/20 2331   sodium chloride     PRN Meds:.acetaminophen **OR** acetaminophen, HYDROcodone-acetaminophen, hydrocortisone, iohexol, ondansetron **OR** ondansetron (ZOFRAN) IV, senna-docusate Medications Prior to Admission:  Prior to Admission medications   Medication Sig Start Date End Date Taking? Authorizing Provider  acetaminophen (TYLENOL) 325 MG tablet Take 2 tablets (650 mg total) by mouth every 6 (six) hours as needed for mild pain (or Fever >/= 101). 12/03/20  Yes Elgergawy, Silver Huguenin, MD  amLODipine (NORVASC) 10 MG tablet TAKE 1 TABLET BY MOUTH  DAILY 11/09/20  Yes Jerrol Banana., MD  aspirin 81 MG chewable tablet Chew 1 tablet (81 mg total) by mouth daily. 09/27/18  Yes Dustin Flock, MD  atorvastatin (LIPITOR) 20 MG tablet TAKE 1 TABLET BY MOUTH  DAILY 08/06/20  Yes Jerrol Banana., MD  cloNIDine (CATAPRES) 0.3 MG tablet Take 0.3 mg by mouth 2 (two) times daily. 09/14/20  Yes [provider]  clopidogrel (PLAVIX) 75 MG tablet TAKE 1 TABLET BY MOUTH  DAILY 07/19/20  Yes Jerrol Banana., MD  Dimethicone (CAVILON DURABLE BARRIER) 1.3 % CREA Apply  barrier cream PRN 08/19/20  Yes Jerrol Banana., MD  enalapril (VASOTEC) 5 MG tablet Take 1 tablet (5 mg total) by mouth daily. Patient taking differently: Take 10 mg by mouth daily. 12/03/20  Yes Elgergawy, Silver Huguenin, MD  famotidine (PEPCID) 20 MG tablet Take 1 tablet (20 mg total) by mouth daily. 05/08/20 06/07/20 Yes Sharen Hones, MD  gabapentin (NEURONTIN) 100 MG capsule Take 200 mg by mouth 2 (two) times daily.  10/14/18  Yes [provider]  hydrALAZINE (APRESOLINE) 50 MG tablet Take 1.5 tablets (75 mg total) by mouth 3 (three) times daily. 12/03/20  Yes Elgergawy, Silver Huguenin, MD  insulin aspart (NOVOLOG) 100 UNIT/ML injection Inject 0-20 Units into the skin 3 (three) times daily with meals. 12/03/20  Yes Elgergawy, Silver Huguenin, MD  insulin glargine (LANTUS) 100 UNIT/ML injection Inject 0.12 mLs (12 Units total) into the skin at bedtime. Patient taking differently: Inject 14 Units into the skin at bedtime. 12/03/20  Yes Elgergawy, Silver Huguenin, MD  levETIRAcetam (KEPPRA) 500 MG tablet Take 1 tablet (500 mg total) by mouth 2 (two) times daily. 10/01/20  Yes Jerrol Banana., MD  metoprolol succinate (TOPROL-XL) 100 MG 24 hr tablet TAKE 1 TABLET BY MOUTH  DAILY 11/09/20  Yes Jerrol Banana., MD  nitroGLYCERIN (NITROSTAT) 0.4 MG SL tablet Place 1 tablet (0.4 mg total) under the tongue every 5 (five) minutes as needed for chest pain. 10/11/15  Yes Jerrol Banana., MD  nystatin (MYCOSTATIN/NYSTOP) powder Apply 1 application topically 2 (two) times daily. Apply to breast folds for skin breakdown.   Yes [provider]  ondansetron (ZOFRAN) 4 MG tablet Take 1 tablet (4 mg total) by mouth every 8 (eight) hours as needed for nausea or vomiting. 06/09/19  Yes Chrismon, Vickki Muff, PA-C  pantoprazole (PROTONIX) 40 MG tablet TAKE 1 TABLET BY MOUTH  DAILY 02/21/20  Yes Jerrol Banana., MD  vitamin B-12 (CYANOCOBALAMIN) 250 MCG tablet Take 1 tablet (250 mcg total) by mouth daily.  12/03/20  Yes Elgergawy, Silver Huguenin, MD  ACCU-CHEK AVIVA PLUS test strip TEST BLOOD SUGAR 3 TIMES  DAILY 06/07/20   Jerrol Banana., MD   Allergies  Allergen Reactions   Morphine And Related Anaphylaxis   Other     Green peas   Reglan [Metoclopramide]     Other reaction(s): Unknown Elevated BP   Simvastatin Other (See Comments)    Muscle pain   Betadine [Povidone Iodine] Rash   Tetracyclines & Related Rash    Other reaction(s): Unknown   Review of Systems  Unable to perform ROS: Mental status change   Physical Exam Vitals and nursing note reviewed.  Constitutional:      General: She is not in acute distress.    Appearance: She is ill-appearing.  HENT:     Mouth/Throat:     Mouth: Mucous membranes are moist.  Cardiovascular:     Rate and Rhythm: Normal rate.  Pulmonary:     Effort: Pulmonary effort is normal. No respiratory distress.  Skin:    General: Skin is warm and dry.  Neurological:     Mental Status: She is alert.  Psychiatric:  Mood and Affect: Mood normal.        Behavior: Behavior normal.    Vital Signs: BP (!) 149/59 (BP Location: Right Arm)   Pulse 66   Temp 98.2 F (36.8 C) (Oral)   Resp 18   Ht _0  (1.575 m)   Wt 93.4 kg   SpO2 98%   BMI 37.66 kg/m  Pain Scale: 0-10   Pain Score: 0-No pain   SpO2: SpO2: 98 % O2 Device:SpO2: 98 % O2 Flow Rate: .   IO: Intake/output summary:  Intake/Output Summary (Last 24 hours) at 12/30/2020 1548 Last data filed at 12/30/2020 1349 Gross per 24 hour  Intake 720 ml  Output 1350 ml  Net -630 ml    LBM: Last BM Date: 12/26/20 Baseline Weight: Weight: 79.4 kg Most recent weight: Weight: 93.4 kg     Palliative Assessment/Data:   Flowsheet Rows    Flowsheet Row Most Recent Value  Intake Tab   Referral Department Hospitalist  Unit at Time of Referral Med/Surg Unit  Palliative Care Primary Diagnosis Nephrology  Date Notified 12/30/20  Palliative Care Type New Palliative care  Reason for  referral Clarify Goals of Care  Date of Admission 12/14/20  Date first seen by Palliative Care 12/30/20  # of days Palliative referral response time 0 Day(s)  # of days IP prior to Palliative referral 16  Clinical Assessment   Palliative Performance Scale Score 40%  Pain Max last 24 hours Not able to report  Pain Min Last 24 hours Not able to report  Dyspnea Max Last 24 Hours Not able to report  Dyspnea Min Last 24 hours Not able to report  Psychosocial & Spiritual Assessment   Palliative Care Outcomes        Time In: 1500 Time Out: 1610 Time Total: 70 minutes  Greater than 50%  of this time was spent counseling and coordinating care related to the above assessment and plan.  Signed by: Drue Novel, NP   Please contact Palliative Medicine Team phone at [phone] for questions and concerns.  For individual provider: See Shea Evans

## 2020-12-31 ENCOUNTER — Encounter: Payer: Self-pay | Admitting: Anesthesiology

## 2020-12-31 ENCOUNTER — Encounter
Admission: EM | Disposition: A | Discharge status: Skilled Nursing Facility | Payer: Self-pay | Source: Skilled Nursing Facility | Attending: Internal Medicine

## 2020-12-31 ENCOUNTER — Inpatient Hospital Stay: Payer: Self-pay | Admitting: Anesthesiology

## 2020-12-31 ENCOUNTER — Encounter: Payer: Self-pay | Admitting: Vascular Surgery

## 2020-12-31 DIAGNOSIS — N186 End stage renal disease: Secondary | ICD-10-CM

## 2020-12-31 DIAGNOSIS — N179 Acute kidney failure, unspecified: Secondary | ICD-10-CM | POA: Diagnosis not present

## 2020-12-31 DIAGNOSIS — N189 Chronic kidney disease, unspecified: Secondary | ICD-10-CM | POA: Diagnosis not present

## 2020-12-31 HISTORY — PX: DIALYSIS/PERMA CATHETER INSERTION: CATH118288

## 2020-12-31 LAB — PROTEIN ELECTRO, RANDOM URINE
Albumin ELP, Urine: 61.3 %
Alpha-1-Globulin, U: 2.9 %
Alpha-2-Globulin, U: 7.7 %
Beta Globulin, U: 14 %
Gamma Globulin, U: 14.1 %
Total Protein, Urine: 335.5 mg/dL

## 2020-12-31 LAB — CBC WITH DIFFERENTIAL/PLATELET
Abs Immature Granulocytes: 0.03 10*3/uL (ref 0.00–0.07)
Basophils Absolute: 0.1 10*3/uL (ref 0.0–0.1)
Basophils Relative: 1 %
Eosinophils Absolute: 0.2 10*3/uL (ref 0.0–0.5)
Eosinophils Relative: 4 %
HCT: 24.6 % — ABNORMAL LOW (ref 36.0–46.0)
Hemoglobin: 8.2 g/dL — ABNORMAL LOW (ref 12.0–15.0)
Immature Granulocytes: 1 %
Lymphocytes Relative: 24 %
Lymphs Abs: 1.3 10*3/uL (ref 0.7–4.0)
MCH: 29.7 pg (ref 26.0–34.0)
MCHC: 33.3 g/dL (ref 30.0–36.0)
MCV: 89.1 fL (ref 80.0–100.0)
Monocytes Absolute: 0.7 10*3/uL (ref 0.1–1.0)
Monocytes Relative: 13 %
Neutro Abs: 3.2 10*3/uL (ref 1.7–7.7)
Neutrophils Relative %: 57 %
Platelets: 250 10*3/uL (ref 150–400)
RBC: 2.76 MIL/uL — ABNORMAL LOW (ref 3.87–5.11)
RDW: 14.6 % (ref 11.5–15.5)
WBC: 5.5 10*3/uL (ref 4.0–10.5)
nRBC: 0 % (ref 0.0–0.2)

## 2020-12-31 LAB — BASIC METABOLIC PANEL
Anion gap: 8 (ref 5–15)
BUN: 72 mg/dL — ABNORMAL HIGH (ref 8–23)
CO2: 20 mmol/L — ABNORMAL LOW (ref 22–32)
Calcium: 8.5 mg/dL — ABNORMAL LOW (ref 8.9–10.3)
Chloride: 111 mmol/L (ref 98–111)
Creatinine, Ser: 3.95 mg/dL — ABNORMAL HIGH (ref 0.44–1.00)
GFR, Estimated: 12 mL/min — ABNORMAL LOW (ref >60)
Glucose, Bld: 217 mg/dL — ABNORMAL HIGH (ref 70–99)
Potassium: 5 mmol/L (ref 3.5–5.1)
Sodium: 139 mmol/L (ref 135–145)

## 2020-12-31 LAB — GLUCOSE, CAPILLARY
Glucose-Capillary: 147 mg/dL — ABNORMAL HIGH (ref 70–99)
Glucose-Capillary: 134 mg/dL — ABNORMAL HIGH (ref 70–99)
Glucose-Capillary: 239 mg/dL — ABNORMAL HIGH (ref 70–99)

## 2020-12-31 SURGERY — DIALYSIS/PERMA CATHETER INSERTION
Anesthesia: Moderate Sedation

## 2020-12-31 MED ORDER — LIDOCAINE HCL (PF) 1 % IJ SOLN
5.0000 mL | INTRAMUSCULAR | Status: DC | PRN
  Filled 2020-12-31: qty 5

## 2020-12-31 MED ORDER — FENTANYL CITRATE (PF) 100 MCG/2ML IJ SOLN
INTRAMUSCULAR | Status: DC | PRN
  Administered 2020-12-31: 12.5 ug via INTRAVENOUS

## 2020-12-31 MED ORDER — MIDAZOLAM HCL 2 MG/2ML IJ SOLN
INTRAMUSCULAR | Status: AC
  Filled 2020-12-31: qty 2

## 2020-12-31 MED ORDER — SODIUM CHLORIDE 0.9 % IV SOLN: INTRAVENOUS | Status: DC

## 2020-12-31 MED ORDER — ALTEPLASE 2 MG IJ SOLR: 2.0000 mg | Freq: Once | INTRAMUSCULAR | Status: DC | PRN

## 2020-12-31 MED ORDER — PENTAFLUOROPROP-TETRAFLUOROETH EX AERO
1.0000 "application " | INHALATION_SPRAY | CUTANEOUS | Status: DC | PRN
  Filled 2020-12-31: qty 30

## 2020-12-31 MED ORDER — FENTANYL CITRATE (PF) 100 MCG/2ML IJ SOLN
INTRAMUSCULAR | Status: AC
  Filled 2020-12-31: qty 2

## 2020-12-31 MED ORDER — CEFAZOLIN (ANCEF) 1 G IV SOLR: 1.0000 g | INTRAVENOUS | Status: DC

## 2020-12-31 MED ORDER — CEFAZOLIN SODIUM-DEXTROSE 2-4 GM/100ML-% IV SOLN
2.0000 g | INTRAVENOUS | Status: DC
  Filled 2020-12-31: qty 100

## 2020-12-31 MED ORDER — SODIUM CHLORIDE 0.9 % IV SOLN
INTRAVENOUS | Status: AC
  Filled 2020-12-31: qty 10

## 2020-12-31 MED ORDER — GABAPENTIN 100 MG PO CAPS
100.0000 mg | ORAL_CAPSULE | Freq: Every day | ORAL | Status: DC
  Administered 2021-01-01 – 2021-01-18 (×17): 100 mg via ORAL
  Filled 2020-12-31 (×18): qty 1

## 2020-12-31 MED ORDER — LIDOCAINE-PRILOCAINE 2.5-2.5 % EX CREA: 1.0000 "application " | TOPICAL_CREAM | CUTANEOUS | Status: DC | PRN

## 2020-12-31 MED ORDER — SODIUM CHLORIDE 0.9 % IV SOLN: 100.0000 mL | INTRAVENOUS | Status: DC | PRN

## 2020-12-31 MED ORDER — HEPARIN SODIUM (PORCINE) 1000 UNIT/ML DIALYSIS
1000.0000 [IU] | INTRAMUSCULAR | Status: DC | PRN
  Administered 2021-01-01: 1000 [IU] via INTRAVENOUS_CENTRAL

## 2020-12-31 MED ORDER — MIDAZOLAM HCL 2 MG/2ML IJ SOLN
INTRAMUSCULAR | Status: DC | PRN
  Administered 2020-12-31: 1 mg via INTRAVENOUS

## 2020-12-31 MED ORDER — LEVETIRACETAM 250 MG PO TABS
250.0000 mg | ORAL_TABLET | Freq: Once | ORAL | Status: AC
  Administered 2020-12-31: 250 mg via ORAL
  Filled 2020-12-31 (×2): qty 1

## 2020-12-31 MED ORDER — SODIUM CHLORIDE 0.9 % IV SOLN
100.0000 mL | INTRAVENOUS | Status: DC | PRN
  Administered 2021-01-06: 100 mL via INTRAVENOUS

## 2020-12-31 SURGICAL SUPPLY — 8 items
BIOPATCH RED 1 DISK 7.0 (GAUZE/BANDAGES/DRESSINGS) ×2 IMPLANT
CATH CANNON HEMO 15FR 19 (HEMODIALYSIS SUPPLIES) ×2 IMPLANT
COVER PROBE U/S 5X48 (MISCELLANEOUS) ×2 IMPLANT
DERMABOND ADVANCED (GAUZE/BANDAGES/DRESSINGS) ×1
DERMABOND ADVANCED .7 DNX12 (GAUZE/BANDAGES/DRESSINGS) ×1 IMPLANT
PACK ANGIOGRAPHY (CUSTOM PROCEDURE TRAY) ×2 IMPLANT
SUT MNCRL AB 4-0 PS2 18 (SUTURE) ×2 IMPLANT
SUT PROLENE 0 CT 1 30 (SUTURE) ×2 IMPLANT

## 2020-12-31 NOTE — Progress Notes (Signed)
[facility]  ROUNDING NOTE   Subjective:   Susan House is a 65 y.o. female with medical conditions including CVA with left sided weakness, Type 2 diabetes, hyperlipidemia, seizures, hypertension and CKD 3b. Patient presents ED with weakness and abnormal labs.   Patient seen and evaluated during hemodialysis treatment. Thus far tolerating well.   HEMODIALYSIS FLOWSHEET:  Blood Flow Rate (mL/min): 200 mL/min Arterial Pressure (mmHg): -80 mmHg Venous Pressure (mmHg): 80 mmHg Transmembrane Pressure (mmHg): 40 mmHg Ultrafiltration Rate (mL/min): 500 mL/min Dialysate Flow Rate (mL/min): 300 ml/min Conductivity: Machine : 13.6 Conductivity: Machine : 13.6    Objective:  Vital signs in last 24 hours:  Temp:  [97.5 F (36.4 C)-98.8 F (37.1 C)] 98.8 F (37.1 C) (06/17 0808) Pulse Rate:  [32-87] 68 (06/17 1130) Resp:  [10-18] 10 (06/17 1130) BP: (138-214)/(45-86) 178/78 (06/17 1130) SpO2:  [92 %-100 %] 100 % (06/17 1130)  Weight change:  Filed Weights   12/27/20 0501 12/28/20 0426 12/30/20 0346  Weight: 91.1 kg 92.4 kg 93.4 kg    Intake/Output: I/O last 3 completed shifts: In: 840 [P.O.:840] Out: 2250 [Urine:2250]   Intake/Output this shift:  No intake/output data recorded.  Physical Exam: General: Resting in bed  Head: Normocephalic, atraumatic.moist oral mucosal membranes  Eyes: Anicteric  Lungs:  Clear bilaterally, normal respiratory effort  Heart: S1S2,no rubs or gallops  Abdomen:  Soft, nontender, non distended  Extremities:  trace peripheral edema.  Neurologic:  l awake, alert, conversant  Skin: No acute lesions or rashes       Basic Metabolic Panel: Recent Labs  Lab 12/27/20 0359 12/28/20 0620 12/29/20 0629 12/30/20 0540 12/31/20 0440  NA 139 139 138 138 139  K 4.9 4.6 4.7 4.6 5.0  CL 108 108 107 110 111  CO2 22 21* 20* 22 20*  GLUCOSE 186* 161* 233* 169* 217*  BUN 74* 70* 66* 66* 72*  CREATININE 4.27* 3.85* 4.05* 3.70*  3.95*  CALCIUM 8.3* 8.7* 8.4* 8.5* 8.5*     Liver Function Tests: No results for input(s): AST, ALT, ALKPHOS, BILITOT, PROT, ALBUMIN in the last 168 hours. No results for input(s): LIPASE, AMYLASE in the last 168 hours. No results for input(s): AMMONIA in the last 168 hours.  CBC: Recent Labs  Lab 12/27/20 0359 12/28/20 0620 12/29/20 0629 12/30/20 0540 12/31/20 0440  WBC 7.2 5.3 5.2 6.3 5.5  NEUTROABS 4.9 2.6 2.6 3.5 3.2  HGB 8.8* 9.1* 8.9* 9.0* 8.2*  HCT 26.3* 27.8* 26.2* 27.3* 24.6*  MCV 86.5 88.0 86.8 88.3 89.1  PLT 242 249 261 255 250     Cardiac Enzymes: No results for input(s): CKTOTAL, CKMB, CKMBINDEX, TROPONINI in the last 168 hours.  BNP: Invalid input(s): POCBNP  CBG: Recent Labs  Lab 12/29/20 2153 12/30/20 0726 12/30/20 1130 12/30/20 1647 12/30/20 2154  GLUCAP 168* 151* 252* 250* 60*     Microbiology: Results for orders placed or performed during the hospital encounter of 12/14/20  SARS CORONAVIRUS 2 (TAT 6-24 HRS) Nasopharyngeal Nasopharyngeal Swab     Status: None   Collection Time: 12/14/20  8:28 PM   Specimen: Nasopharyngeal Swab  Result Value Ref Range Status   SARS Coronavirus 2 NEGATIVE NEGATIVE Final    Comment: (NOTE) SARS-CoV-2 target nucleic acids are NOT DETECTED.  The SARS-CoV-2 RNA is generally detectable in upper and lower respiratory specimens during the acute phase of infection. Negative results do not preclude SARS-CoV-2 infection, do not rule out co-infections with other pathogens, and should not  be used as the sole basis for treatment or other patient management decisions. Negative results must be combined with clinical observations, patient history, and epidemiological information. The expected result is Negative.  Fact Sheet for Patients: SugarRoll.be  Fact Sheet for Healthcare Providers: https://www.woods-mathews.com/  This test is not yet approved or cleared by the [place] FDA and  has been authorized for detection and/or diagnosis of SARS-CoV-2 by FDA under an Emergency Use Authorization (EUA). This EUA will remain  in effect (meaning this test can be used) for the duration of the COVID-19 declaration under Se ction 564(b)(1) of the Act, 21 U.S.C. section 360bbb-3(b)(1), unless the authorization is terminated or revoked sooner.  Performed at [facility], [place] [address], [place], [place] [address]   SARS CORONAVIRUS 2 (TAT 6-24 HRS) Nasopharyngeal Nasopharyngeal Swab     Status: None   Collection Time: 12/19/20  4:40 PM   Specimen: Nasopharyngeal Swab  Result Value Ref Range Status   SARS Coronavirus 2 NEGATIVE NEGATIVE Final    Comment: (NOTE) SARS-CoV-2 target nucleic acids are NOT DETECTED.  The SARS-CoV-2 RNA is generally detectable in upper and lower respiratory specimens during the acute phase of infection. Negative results do not preclude SARS-CoV-2 infection, do not rule out co-infections with other pathogens, and should not be used as the sole basis for treatment or other patient management decisions. Negative results must be combined with clinical observations, patient history, and epidemiological information. The expected result is Negative.  Fact Sheet for Patients: SugarRoll.be  Fact Sheet for Healthcare Providers: https://www.woods-mathews.com/  This test is not yet approved or cleared by the [place] FDA and  has been authorized for detection and/or diagnosis of SARS-CoV-2 by FDA under an Emergency Use Authorization (EUA). This EUA will remain  in effect (meaning this test can be used) for the duration of the COVID-19 declaration under Se ction 564(b)(1) of the Act, 21 U.S.C. section 360bbb-3(b)(1), unless the authorization is terminated or revoked sooner.  Performed at [facility], [place] [address] [place], [place] [address]     Coagulation Studies: No  results for input(s): LABPROT, INR in the last 72 hours.  Urinalysis: No results for input(s): COLORURINE, LABSPEC, PHURINE, GLUCOSEU, HGBUR, BILIRUBINUR, KETONESUR, PROTEINUR, UROBILINOGEN, NITRITE, LEUKOCYTESUR in the last 72 hours.  Invalid input(s): APPERANCEUR    Imaging: PERIPHERAL VASCULAR CATHETERIZATION  Result Date: 12/31/2020 See surgical note for result.    Medications:    sodium chloride 50 mL/hr at 12/28/20 2331   sodium chloride     sodium chloride     sodium chloride      amLODipine  10 mg Oral Daily   aspirin  81 mg Oral Daily   atorvastatin  20 mg Oral QHS   Chlorhexidine Gluconate Cloth  6 each Topical Q0600   cloNIDine  0.3 mg Oral BID   clopidogrel  75 mg Oral Daily   fentaNYL       gabapentin  100 mg Oral BID   heparin  5,000 Units Subcutaneous Q8H   hydrALAZINE  100 mg Oral Q6H   insulin aspart  0-15 Units Subcutaneous TID WC   insulin aspart  3 Units Subcutaneous TID WC   insulin glargine  10 Units Subcutaneous QHS   isosorbide mononitrate  60 mg Oral Daily   levETIRAcetam  500 mg Oral Q24H   metoprolol tartrate  50 mg Oral BID   midazolam       multivitamin with minerals  1 tablet Oral Daily  nystatin  1 application Topical BID   pantoprazole  40 mg Oral Daily   polyethylene glycol  17 g Oral Daily   senna-docusate  2 tablet Oral BID   ceFAZolin (ANCEF) IVPB 1 gram/100 mL NS (Mini-Bag Plus)       vitamin B-12  250 mcg Oral Daily   sodium chloride, sodium chloride, acetaminophen **OR** acetaminophen, alteplase, heparin, HYDROcodone-acetaminophen, hydrocortisone, iohexol, lidocaine (PF), lidocaine-prilocaine, ondansetron **OR** ondansetron (ZOFRAN) IV, pentafluoroprop-tetrafluoroeth, senna-docusate  Assessment/ Plan:  Susan House is a 65 y.o.  female who was admitted to [facility] on 11/28/2020 for AKI on CKD. Patient has previous medical conditions including diabetes, hypertension, CVA with left hemiplegia residual, morbid obesity and  chronic kidney disease stage 3B. She presents to the ED with weakness and abnormal labs.    Acute Kidney Injury on chronic kidney disease stage 3B with baseline creatinine 2.32 and GFR of 23 on 12/03/20.  Acute kidney injury secondary to ATN due to poor intake vs uncontrolled hypertension Chronic kidney disease is secondary to uncontrolled diabetes -PermCath placed today.  Appreciate vascular surgery assistance.  Outpatient hemodialysis center placement pending.  Check hepatitis B serologies.  Next dialysis treatment tomorrow.  Lab Results  Component Value Date   CREATININE 3.95 (H) 12/31/2020   CREATININE 3.70 (H) 12/30/2020   CREATININE 4.05 (H) 12/29/2020    Intake/Output Summary (Last 24 hours) at 12/31/2020 1251 Last data filed at 12/31/2020 0639 Gross per 24 hour  Intake 480 ml  Output 900 ml  Net -420 ml     2. Hypertension:  BP elevated Patient currently on amlodipine, clonidine, hydralazine, and metoprolol.  3. Diabetes mellitus type II with chronic kidney disease Hgb A1c 8.6 on 11/29/20 Elevated  Glucose monitoring by primary team  4. Anemia of CKD Lab Results  Component Value Date   HGB 8.2 (L) 12/31/2020  Hold off on Epogen for now.   LOS: 15 Billyjack Trompeter 6/17/202212:51 PM

## 2020-12-31 NOTE — Op Note (Signed)
OPERATIVE NOTE    PRE-OPERATIVE DIAGNOSIS: 1. ESRD   POST-OPERATIVE DIAGNOSIS: same as above  PROCEDURE: Ultrasound guidance for vascular access to the right internal jugular vein Fluoroscopic guidance for placement of catheter Placement of a 19 cm tip to cuff tunneled hemodialysis catheter via the right internal jugular vein  SURGEON: Leotis Pain, MD  ANESTHESIA:  Local with Moderate conscious sedation for approximately 16 minutes using 1 mg of Versed and 12.5 mcg of Fentanyl  ESTIMATED BLOOD LOSS: 5 cc  FLUORO TIME: less than one minute  CONTRAST: none  FINDING(S): 1.  Patent right internal jugular vein  SPECIMEN(S):  None  INDICATIONS:   Susan House is a 65 y.o.female who presents with renal failure.  The patient needs long term dialysis access for their ESRD, and a Permcath is necessary.  Risks and benefits are discussed and informed consent is obtained.    DESCRIPTION: After obtaining full informed written consent, the patient was brought back to the vascular suited. The patient's right neck and chest were sterilely prepped and draped in a sterile surgical field was created. Moderate conscious sedation was administered during a face to face encounter with the patient throughout the procedure with my supervision of the RN administering medicines and monitoring the patient's vital signs, pulse oximetry, telemetry and mental status throughout from the start of the procedure until the patient was taken to the recovery room.  The right internal jugular vein was visualized with ultrasound and found to be patent. It was then accessed under direct ultrasound guidance and a permanent image was recorded. A wire was placed. After skin nick and dilatation, the peel-away sheath was placed over the wire. I then turned my attention to an area under the clavicle. Approximately 1-2 fingerbreadths below the clavicle a small counterincision was created and tunneled from the subclavicular  incision to the access site. Using fluoroscopic guidance, a 19 centimeter tip to cuff tunneled hemodialysis catheter was selected, and tunneled from the subclavicular incision to the access site. It was then placed through the peel-away sheath and the peel-away sheath was removed. Using fluoroscopic guidance the catheter tips were parked in the right atrium. The appropriate distal connectors were placed. It withdrew blood well and flushed easily with heparinized saline and a concentrated heparin solution was then placed. It was secured to the chest wall with 2 Prolene sutures. The access incision was closed single 4-0 Monocryl. A 4-0 Monocryl pursestring suture was placed around the exit site. Sterile dressings were placed. The patient tolerated the procedure well and was taken to the recovery room in stable condition.  COMPLICATIONS: None  CONDITION: Stable  Leotis Pain, MD 12/31/2020 8:39 AM   This note was created with Dragon Medical transcription system. Any errors in dictation are purely unintentional.

## 2020-12-31 NOTE — TOC Progression Note (Addendum)
Transition of Care [facility]) - Progression Note    Patient Details  Name: Susan House MRN: [id number] Date of Birth: 1956/03/08  Transition of Care [facility]) CM/SW [place], LCSW Phone Number: 12/31/2020, 10:49 AM  Clinical Narrative:   Reached out to [facility] Representative Venia Carbon to confirm they have patient on list for outpatient palliative care. If patient returns to [facility], they have their own palliative services.    Expected Discharge Plan: [place] Barriers to Discharge: Continued Medical Work up  Expected Discharge Plan and Services Expected Discharge Plan: [place] Acute Care Choice: Resumption of Svcs/PTA Provider Living arrangements for the past 2 months: Single Family Home                                       Social Determinants of Health (SDOH) Interventions    Readmission Risk Interventions No flowsheet data found.

## 2020-12-31 NOTE — Progress Notes (Signed)
PROGRESS NOTE    Susan House  AVW:[id number] DOB: 08-Oct-1955 DOA: 12/14/2020 PCP: Jerrol Banana., MD   Brief Narrative:  65 y.o. female with medical history significant for history of CVA with residual left-sided weakness, left frontal encephalomalacia, CKD stage IIIb-IV, insulin-dependent type 2 diabetes, hypertension, hyperlipidemia, seizure disorder, and memory loss who presents to the ED for evaluation of generalized weakness and abnormal labs.   Patient recently admitted 11/28/2020-12/03/2020 for AKI on CKD managed with IV fluid hydration.  Renal ultrasound showed increased echogenicity compatible with medical renal disease and a 2.7 cm right kidney cyst.  She was discharged to SNF.   Patient states that she was sent to the ED due to abnormal labs.  Found to have AKI on CKD and evidence of urinary tract infection.   Kidney function appears to have peaked at 3.44.  Now slightly downtrending.  Mental status also slowly improving.  Mental status has been waxing and waning.  Discussed this with the patient's daughter.  I feel this is a progression of underlying vascular dementia and patient if may be established a new psychiatric baseline.   Unfortunately blood pressure has been very difficult to control.  Intravenous fluids were discontinued on 6/5 and patient had subsequent deterioration kidney function 6/6.  Nephrology following.   6/7-6/13: Renal function continues to get worse although still making urine so nephrology not ready for dialysis yet but not able to discharge either as her kidney function has not returned to baseline.  She required 1 PRBC transfusion on 6/12 for hemoglobin of 6.9 with appropriate response  6/14: Blood pressure improved control over interval though still not ideal.  Kidney function slowly improving.  Fluids restarted 6/13.  6/15: Kidney function is deteriorating despite gentle IV fluid resuscitation.  Case discussed with nephrology.  Lengthy  conversation with the husband and patient at bedside.  Likely that hemodialysis would be indicated.  Still questionable whether this is within the patient's and family's goals of care. 6/16: Had a lengthy conversation with the patient at bedside this morning.  I explained that hemodialysis was recommended given her deteriorating kidney function and lack of appreciable improvement.  After nephrology follow-up patient now agrees to have PermCath placed 6/17: PermCath placed and hemodialysis initiated today.  Patient seems to be tolerating well.    Assessment & Plan:   Principal Problem:   Acute kidney injury superimposed on chronic kidney disease ([place]) Active Problems:   Hypertension associated with diabetes ([place])   Type 2 diabetes mellitus with complications ([facility])   Hemiparesis due to old cerebrovascular accident ([place])   Hyperlipidemia associated with type 2 diabetes mellitus ([place])   Hx of completed stroke   AKI (acute kidney injury) ([place])   Protein-calorie malnutrition, severe  Progression to end-stage kidney disease Recently admitted for the same. Renal function worsened today Similar to previous presentation likely etiology is ATN in the setting of poorly controlled hypertension as well as possible relative dehydration/weight depletion  Nephrology following.   Renal ultrasound with Doppler rule showed 10.8 x 5.6 cm cystic mass on the right side.  CT abdomen pelvis did not show this mass.  Showed some colitis but she is not having any symptoms of colitis so we will hold off treatment.  Also diverticulosis but no diverticulitis.  After persistent deterioration of kidney function decision made to start hemodialysis.  PermCath placed 6/17.  Hemodialysis to be initiated after  Plan: HD today   Refractory hypertension  Continue current regimen of amlodipine,  clonidine, hydralazine, metoprolol, and are Vasotec currently on hold Should be able to titrate blood pressure regimen    Urinary tract infection Completed 3 days of Rocephin.   Anemia of chronic kidney disease and B12 deficiency Hemoglobin 6.9 on 6/13 - s/p 1 PRBC for transfusion and Hb 8.8  Patient denies any obvious bleeding.  Continue B12 supplement and monitor for any signs/symptoms of bleeding.   Insulin-dependent type 2 diabetes A1c 8.6% Lantus 10 units nightly NovoLog 3 units 3 times daily with meals Moderate sliding scale Carb modified diet   History of CVA with residual left-sided weakness/left frontal encephalomalacia Continue aspirin, Plavix, atorvastatin.   Seizure disorder Continue Keppra Seizure precaution   Memory loss Suspected underlying vascular dementia.   She had some reported confusion/delirium prior to arrival.   Baseline level of mentation is unclear CT head significant for chronic volume loss and small vessel ischemic changes consistent with suspected vascular dementia Outpatient follow-up   Constipation Continue Senokot-S and MiraLAX   DVT prophylaxis: SQ heparin Code Status: DNR Family Communication: Daughter [name] [phone] on 6/16 Disposition Plan: Status is: Inpatient  Remains inpatient appropriate because:Inpatient level of care appropriate due to severity of illness  Dispo: The patient is from: SNF              Anticipated d/c is to: SNF              Patient currently is not medically stable to d/c.   Difficult to place patient No  PermCath placed a new hemodialysis start today     Level of care: Med-Surg  Consultants:  Nephrology  Procedures:  None  Antimicrobials:  None   Subjective: Patient seen and examined after permacath placement.  Stable, no distress  Objective: Vitals:   12/31/20 1315 12/31/20 1330 12/31/20 1343 12/31/20 1345  BP: (!) 113/59 (!) 145/57  (!) 155/67  Pulse: 66 65  64  Resp: 11 11  11   Temp:   98.6 F (37 C)   TempSrc:   Oral   SpO2: 100% 100%  100%  Weight:      Height:        Intake/Output Summary  (Last 24 hours) at 12/31/2020 1404 Last data filed at 12/31/2020 1315 Gross per 24 hour  Intake 240 ml  Output 1400 ml  Net -1160 ml   Filed Weights   12/27/20 0501 12/28/20 0426 12/30/20 0346  Weight: 91.1 kg 92.4 kg 93.4 kg    Examination:  General exam: No acute distress Respiratory system: Poor respiratory effort.  Lungs clear.  Room air Cardiovascular system: S1-S2, regular rate and rhythm, no murmurs, trace pedal edema, PermCath in place right chest  gastrointestinal system: Abdomen is nondistended, soft and nontender. No organomegaly or masses felt. Normal bowel sounds heard. Central nervous system: Alert, oriented x2, no focal deficits Extremities: Symmetric 5 x 5 power. Skin: No rashes, lesions or ulcers Psychiatry: Judgement and insight appear impaired. Mood & affect flattened.     Data Reviewed: I have personally reviewed following labs and imaging studies  CBC: Recent Labs  Lab 12/27/20 0359 12/28/20 0620 12/29/20 0629 12/30/20 0540 12/31/20 0440  WBC 7.2 5.3 5.2 6.3 5.5  NEUTROABS 4.9 2.6 2.6 3.5 3.2  HGB 8.8* 9.1* 8.9* 9.0* 8.2*  HCT 26.3* 27.8* 26.2* 27.3* 24.6*  MCV 86.5 88.0 86.8 88.3 89.1  PLT 242 249 261 255 361   Basic Metabolic Panel: Recent Labs  Lab 12/27/20 0359 12/28/20 0620 12/29/20 0629 12/30/20 0540 12/31/20 0440  NA 139 139 138 138 139  K 4.9 4.6 4.7 4.6 5.0  CL 108 108 107 110 111  CO2 22 21* 20* 22 20*  GLUCOSE 186* 161* 233* 169* 217*  BUN 74* 70* 66* 66* 72*  CREATININE 4.27* 3.85* 4.05* 3.70* 3.95*  CALCIUM 8.3* 8.7* 8.4* 8.5* 8.5*   GFR: Estimated Creatinine Clearance: 15.3 mL/min (A) (by C-G formula based on SCr of 3.95 mg/dL (H)). Liver Function Tests: No results for input(s): AST, ALT, ALKPHOS, BILITOT, PROT, ALBUMIN in the last 168 hours. No results for input(s): LIPASE, AMYLASE in the last 168 hours. No results for input(s): AMMONIA in the last 168 hours. Coagulation Profile: No results for input(s): INR, PROTIME  in the last 168 hours. Cardiac Enzymes: No results for input(s): CKTOTAL, CKMB, CKMBINDEX, TROPONINI in the last 168 hours. BNP (last 3 results) No results for input(s): PROBNP in the last 8760 hours. HbA1C: No results for input(s): HGBA1C in the last 72 hours. CBG: Recent Labs  Lab 12/29/20 2153 12/30/20 0726 12/30/20 1130 12/30/20 1647 12/30/20 2154  GLUCAP 168* 151* 252* 250* 269*   Lipid Profile: No results for input(s): CHOL, HDL, LDLCALC, TRIG, CHOLHDL, LDLDIRECT in the last 72 hours. Thyroid Function Tests: No results for input(s): TSH, T4TOTAL, FREET4, T3FREE, THYROIDAB in the last 72 hours. Anemia Panel: No results for input(s): VITAMINB12, FOLATE, FERRITIN, TIBC, IRON, RETICCTPCT in the last 72 hours. Sepsis Labs: No results for input(s): PROCALCITON, LATICACIDVEN in the last 168 hours.  No results found for this or any previous visit (from the past 240 hour(s)).        Radiology Studies: PERIPHERAL VASCULAR CATHETERIZATION  Result Date: 12/31/2020 See surgical note for result.       Scheduled Meds:  amLODipine  10 mg Oral Daily   aspirin  81 mg Oral Daily   atorvastatin  20 mg Oral QHS   Chlorhexidine Gluconate Cloth  6 each Topical Q0600   cloNIDine  0.3 mg Oral BID   clopidogrel  75 mg Oral Daily   fentaNYL       gabapentin  100 mg Oral BID   heparin  5,000 Units Subcutaneous Q8H   hydrALAZINE  100 mg Oral Q6H   insulin aspart  0-15 Units Subcutaneous TID WC   insulin aspart  3 Units Subcutaneous TID WC   insulin glargine  10 Units Subcutaneous QHS   isosorbide mononitrate  60 mg Oral Daily   levETIRAcetam  500 mg Oral Q24H   metoprolol tartrate  50 mg Oral BID   midazolam       multivitamin with minerals  1 tablet Oral Daily   nystatin  1 application Topical BID   pantoprazole  40 mg Oral Daily   polyethylene glycol  17 g Oral Daily   senna-docusate  2 tablet Oral BID   ceFAZolin (ANCEF) IVPB 1 gram/100 mL NS (Mini-Bag Plus)        vitamin B-12  250 mcg Oral Daily   Continuous Infusions:  sodium chloride 50 mL/hr at 12/28/20 2331   sodium chloride     sodium chloride     sodium chloride       LOS: 15 days    Time spent: 25 minutes    Sidney Ace, MD [facility] Pager [phone]  If 7PM-7AM, please contact night-coverage  12/31/2020, 2:04 PM

## 2020-12-31 NOTE — Consult Note (Signed)
[facility] Vascular Consult Note  MRN : [id number]  Susan House Gulianna Hornsby is a 65 y.o. (1955/09/29) female who presents with chief complaint of  Chief Complaint  Patient presents with   Weakness  .  History of Present Illness: I am asked to see the patient by Colon Flattery for placement of a permcath.  The patient is confused and not can provide much history.  She has had progressive renal failure and is now felt to be end-stage renal disease.  Some labored respirations, confusion, and likely uremia needing dialysis.  She has a long list of medical comorbidities as listed below.  Progressive renal failure over time.  No fevers or chills.  Her main complaint was of weakness and lethargy on admission and this has not improved.  Current Facility-Administered Medications  Medication Dose Route Frequency Provider Last Rate Last Admin   0.9 %  sodium chloride infusion   Intravenous Continuous Colon Flattery, NP 50 mL/hr at 12/28/20 2331 New Bag at 12/28/20 2331   0.9 %  sodium chloride infusion   Intravenous Continuous Nolen Lindamood, Erskine Squibb, MD       0.9 %  sodium chloride infusion  100 mL Intravenous PRN Breeze, Shantelle, NP       0.9 %  sodium chloride infusion  100 mL Intravenous PRN Colon Flattery, NP       [MAR Hold] acetaminophen (TYLENOL) tablet 650 mg  650 mg Oral Q6H PRN Lenore Cordia, MD   650 mg at 12/27/20 0915   Or   [MAR Hold] acetaminophen (TYLENOL) suppository 650 mg  650 mg Rectal Q6H PRN Zada Finders R, MD       alteplase (CATHFLO ACTIVASE) injection 2 mg  2 mg Intracatheter Once PRN Colon Flattery, NP       [MAR Hold] amLODipine (NORVASC) tablet 10 mg  10 mg Oral Daily Max Sane, MD   10 mg at 12/30/20 7062   [facility] Hold] aspirin chewable tablet 81 mg  81 mg Oral Daily Lenore Cordia, MD   81 mg at 12/30/20 0821   [MAR Hold] atorvastatin (LIPITOR) tablet 20 mg  20 mg Oral QHS Zada Finders R, MD   20 mg at 12/30/20 2150   ceFAZolin (ANCEF) IVPB  2g/100 mL premix  2 g Intravenous 30 min Pre-Op Algernon Huxley, MD       [MAR Hold] Chlorhexidine Gluconate Cloth 2 % PADS 6 each  6 each Topical Q0600 Colon Flattery, NP       [MAR Hold] cloNIDine (CATAPRES) tablet 0.3 mg  0.3 mg Oral BID Zada Finders R, MD   0.3 mg at 12/30/20 2149   [MAR Hold] clopidogrel (PLAVIX) tablet 75 mg  75 mg Oral Daily Lenore Cordia, MD   75 mg at 12/30/20 0823   [MAR Hold] gabapentin (NEURONTIN) capsule 100 mg  100 mg Oral BID Max Sane, MD   100 mg at 12/30/20 2150   heparin injection 1,000 Units  1,000 Units Dialysis PRN Colon Flattery, NP       [facility] Hold] heparin injection 5,000 Units  5,000 Units Subcutaneous Q8H Zada Finders R, MD   5,000 Units at 12/31/20 0601   [MAR Hold] hydrALAZINE (APRESOLINE) tablet 100 mg  100 mg Oral Q6H Max Sane, MD   100 mg at 12/30/20 2038   [facility] Hold] HYDROcodone-acetaminophen (NORCO/VICODIN) 5-325 MG per tablet 1 tablet  1 tablet Oral Q4H PRN Max Sane, MD   1 tablet at 12/30/20 1310   [  MAR Hold] hydrocortisone 1 % ointment   Topical QID PRN Ralene Muskrat B, MD       [MAR Hold] insulin aspart (novoLOG) injection 0-15 Units  0-15 Units Subcutaneous TID WC Lenore Cordia, MD   5 Units at 12/30/20 1716   [MAR Hold] insulin aspart (novoLOG) injection 3 Units  3 Units Subcutaneous TID WC Ralene Muskrat B, MD   3 Units at 12/30/20 1717   [MAR Hold] insulin glargine (LANTUS) injection 10 Units  10 Units Subcutaneous QHS Ralene Muskrat B, MD   10 Units at 12/30/20 2153   [facility] Hold] iohexol (OMNIPAQUE) 9 MG/ML oral solution 500 mL  500 mL Oral Once PRN Max Sane, MD       [facility] Hold] isosorbide mononitrate (IMDUR) 24 hr tablet 60 mg  60 mg Oral Daily Manuella Ghazi, Vipul, MD   60 mg at 12/30/20 0820   [MAR Hold] levETIRAcetam (KEPPRA) tablet 500 mg  500 mg Oral Q24H Manuella Ghazi, Vipul, MD   500 mg at 12/30/20 0539   lidocaine (PF) (XYLOCAINE) 1 % injection 5 mL  5 mL Intradermal PRN Colon Flattery, NP       lidocaine-prilocaine  (EMLA) cream 1 application  1 application Topical PRN Colon Flattery, NP       [MAR Hold] metoprolol tartrate (LOPRESSOR) tablet 50 mg  50 mg Oral BID Max Sane, MD   50 mg at 12/30/20 2150   [MAR Hold] multivitamin with minerals tablet 1 tablet  1 tablet Oral Daily Ralene Muskrat B, MD   1 tablet at 12/30/20 0821   [MAR Hold] nystatin (MYCOSTATIN/NYSTOP) topical powder 1 application  1 application Topical BID Ralene Muskrat B, MD   1 application at 76/73/41 2153   [MAR Hold] ondansetron (ZOFRAN) tablet 4 mg  4 mg Oral Q6H PRN Lenore Cordia, MD       Or   [facility] Hold] ondansetron (ZOFRAN) injection 4 mg  4 mg Intravenous Q6H PRN Zada Finders R, MD   4 mg at 12/15/20 0040   [MAR Hold] pantoprazole (PROTONIX) EC tablet 40 mg  40 mg Oral Daily Zada Finders R, MD   40 mg at 12/30/20 9379   pentafluoroprop-tetrafluoroeth (GEBAUERS) aerosol 1 application  1 application Topical PRN Colon Flattery, NP       [MAR Hold] polyethylene glycol (MIRALAX / GLYCOLAX) packet 17 g  17 g Oral Daily Manuella Ghazi, Vipul, MD   17 g at 12/30/20 0823   [MAR Hold] senna-docusate (Senokot-S) tablet 1 tablet  1 tablet Oral QHS PRN Lenore Cordia, MD       [facility] Hold] senna-docusate (Senokot-S) tablet 2 tablet  2 tablet Oral BID Max Sane, MD   2 tablet at 12/30/20 0821   [facility] Hold] sodium chloride 0.9 % bolus 250 mL  250 mL Intravenous Once Manuella Ghazi, Vipul, MD       sodium chloride 0.9 % with ceFAZolin (ANCEF) ADS Med            [MAR Hold] vitamin B-12 (CYANOCOBALAMIN) tablet 250 mcg  250 mcg Oral Daily Lenore Cordia, MD   250 mcg at 12/30/20 0240    Past Medical History:  Diagnosis Date   Allergy    Arthritis    Atypical chest pain    a. Reportedly nl cath in the past; b. 12/2009 MV: No ischemia/infarct. EF 87%.   Carotid arterial disease ([place])    a. 03/7352 CTA head/neck: LICA 29J, RICA 24-->QAS rx.   Cellulitis and abscess of face  Chronic Dizziness    Coronary artery calcification seen on CT scan    a.  10/2016 CTA chest: Atherosclerotic calcifications Ao, cors, and prox great vessels.   Depression    Diabetes mellitus ([place])    Diastolic dysfunction    a. 2008 Echo: Nl EF; b. 2011 Echo: Nl EF; c. 06/2016 Echo: EF 55-60%, Gr1DD; d. 03/2018 Echo: EF 55-60%, no rwma, Gr1DD, mod dil LA.   Edema    GERD (gastroesophageal reflux disease)    Headache    Hematuria    Hyperlipidemia    Hypertension    IBS (irritable bowel syndrome)    Migraine    Morbid obesity ([place])    Nocturia    Possible Seizure ([place])    a. 10/2016 ? sz. MRI neg for stroke.   Stroke [facility]) 2000   TIA (transient ischemic attack) 2010 & 2019   a. 2010; b. 03/2018 Left sided wkns-->MRI neg for stroke. CTA head/neck: Right A2 cut off/occlusion w/ collats extending over R cerebral convexity. LICA 20N, RICA 45, RSubclavian 50->med rx.   Urgency of micturation    Urinary frequency    Urinary incontinence     Past Surgical History:  Procedure Laterality Date   ABDOMINAL HYSTERECTOMY     ABDOMINOPLASTY     APPENDECTOMY     BACK SURGERY     extensive spinal fusion   carpel tunnel surgery     CATARACT EXTRACTION     x 2   CESAREAN SECTION     CHOLECYSTECTOMY     COLONOSCOPY  2010   COLONOSCOPY WITH PROPOFOL N/A 07/11/2016   Procedure: COLONOSCOPY WITH PROPOFOL;  Surgeon: Lucilla Lame, MD;  Location: [facility] ENDOSCOPY;  Service: Endoscopy;  Laterality: N/A;   COLONOSCOPY WITH PROPOFOL N/A 03/26/2018   Procedure: COLONOSCOPY WITH PROPOFOL;  Surgeon: Lucilla Lame, MD;  Location: [facility] ENDOSCOPY;  Service: Endoscopy;  Laterality: N/A;   ESOPHAGOGASTRODUODENOSCOPY (EGD) WITH PROPOFOL N/A 03/26/2018   Procedure: ESOPHAGOGASTRODUODENOSCOPY (EGD) WITH PROPOFOL;  Surgeon: Lucilla Lame, MD;  Location: [facility] ENDOSCOPY;  Service: Endoscopy;  Laterality: N/A;   EYE SURGERY     HERNIA REPAIR     SHOULDER SURGERY     TEE WITHOUT CARDIOVERSION N/A 05/07/2020   Procedure: TRANSESOPHAGEAL ECHOCARDIOGRAM (TEE);  Surgeon: Nelva Bush, MD;   Location: [facility] ORS;  Service: Cardiovascular;  Laterality: N/A;   TOE SURGERY     UPPER GI ENDOSCOPY     WRIST FRACTURE SURGERY Left      Social History   Tobacco Use   Smoking status: Never   Smokeless tobacco: Never  Vaping Use   Vaping Use: Never used  Substance Use Topics   Alcohol use: No   Drug use: No     Family History  Problem Relation Age of Onset   Hyperlipidemia Mother    Arrhythmia Mother        WPW   Hypertension Mother    Rheum arthritis Mother    Heart disease Mother    Heart attack Father 71   Hyperlipidemia Father    Hypertension Father    Stroke Father    Diabetes Father    Heart disease Father    Coronary artery disease Father    Diabetes Sister    Hyperlipidemia Sister    Hypertension Sister    Depression Sister    Depression Sister    Diabetes Sister    Hypertension Sister    Hyperlipidemia Sister    Kidney disease Paternal Grandmother     Allergies  Allergen Reactions   Morphine And Related Anaphylaxis   Other     Green peas   Reglan [Metoclopramide]     Other reaction(s): Unknown Elevated BP   Simvastatin Other (See Comments)    Muscle pain   Betadine [Povidone Iodine] Rash   Tetracyclines & Related Rash    Other reaction(s): Unknown     REVIEW OF SYSTEMS (Negative unless checked) Patient unable to provide due to confusion and poor mental status  Physical Examination  Vitals:   12/30/20 1210 12/30/20 1549 12/30/20 1953 12/31/20 0500  BP: (!) 149/59 (!) 138/54 (!) 154/64 (!) 148/68  Pulse: 66 (!) 58 75 72  Resp: 18 15 14 16   Temp: 98.2 F (36.8 C) (!) 97.5 F (36.4 C) (!) 97.5 F (36.4 C) 97.9 F (36.6 C)  TempSrc: Oral     SpO2: 98% 100%    Weight:      Height:       Body mass index is 37.66 kg/m. Gen:  WD/WN, NAD.  Obese Head: [place]/AT, No temporalis wasting.  Ear/Nose/Throat: Hearing grossly intact, nares w/o erythema or drainage, oropharynx w/o Erythema/Exudate Eyes: Sclera non-icteric, conjunctiva  clear Neck: Trachea midline.  No JVD.  Pulmonary:  Good air movement, respirations mildly labored at rest Cardiac: RRR, normal S1, S2. Vascular:  Vessel Right Left  Radial Palpable Palpable                                   Gastrointestinal: soft, non-tender/non-distended. No guarding/reflex.  Musculoskeletal: M/S 5/5 throughout.  Extremities without ischemic changes.  No deformity or atrophy.  Mild to moderate lower extremity edema Neurologic: Sensation grossly intact in extremities.  Symmetrical.  Speech is fluent but she is confused. Motor exam as listed above. Psychiatric: Judgment and insight are not great, patient unable to provide much history Dermatologic: No rashes or ulcers noted.  No cellulitis or open wounds.      CBC Lab Results  Component Value Date   WBC 5.5 12/31/2020   HGB 8.2 (L) 12/31/2020   HCT 24.6 (L) 12/31/2020   MCV 89.1 12/31/2020   PLT 250 12/31/2020    BMET    Component Value Date/Time   NA 139 12/31/2020 0440   NA 140 01/23/2019 1442   NA 140 06/17/2014 0744   K 5.0 12/31/2020 0440   K 4.1 06/17/2014 0744   CL 111 12/31/2020 0440   CL 105 06/17/2014 0744   CO2 20 (L) 12/31/2020 0440   CO2 27 06/17/2014 0744   GLUCOSE 217 (H) 12/31/2020 0440   GLUCOSE 116 (H) 06/17/2014 0744   BUN 72 (H) 12/31/2020 0440   BUN 23 01/23/2019 1442   BUN 35 (H) 06/17/2014 0744   CREATININE 3.95 (H) 12/31/2020 0440   CREATININE 1.29 06/17/2014 0744   CALCIUM 8.5 (L) 12/31/2020 0440   CALCIUM 8.6 06/17/2014 0744   GFRNONAA 12 (L) 12/31/2020 0440   GFRNONAA 45 (L) 06/17/2014 0744   GFRNONAA 47 (L) 01/18/2014 0059   GFRAA 46 (L) 12/24/2019 0513   GFRAA 55 (L) 06/17/2014 0744   GFRAA 55 (L) 01/18/2014 0059   Estimated Creatinine Clearance: 15.3 mL/min (A) (by C-G formula based on SCr of 3.95 mg/dL (H)).  COAG Lab Results  Component Value Date   INR 0.9 05/04/2020   INR 1.0 04/27/2020   INR 1.0 12/23/2019    Radiology CT ABDOMEN PELVIS WO  CONTRAST  Result Date: 12/22/2020 CLINICAL  DATA:  Urologic cancer, surveillance cycstic mass Cystic mass seen on renal ultrasound. EXAM: CT ABDOMEN AND PELVIS WITHOUT CONTRAST TECHNIQUE: Multidetector CT imaging of the abdomen and pelvis was performed following the standard protocol without IV contrast. COMPARISON:  Renal ultrasound earlier today. Abdominopelvic CT 10/15/2016 FINDINGS: Lower chest: The heart is enlarged. Dense mitral annulus calcifications. There are coronary artery calcifications. Small right pleural effusion. No confluent basilar consolidation. Hepatobiliary: No evidence of focal liver abnormality on this noncontrast exam. Cholecystectomy without biliary dilatation. Pancreas: Fatty atrophy about the head and uncinate process. No ductal dilatation or inflammation. Spleen: Normal in size without focal abnormality. Adrenals/Urinary Tract: Mild adrenal thickening without dominant nodule. Mild bilateral renal parenchymal thinning with cortical scarring in the mid left kidney. No hydronephrosis. Slight perinephric edema. Renal cyst cyst seen on ultrasound. There is no evidence of suspicious renal lesion. No renal calculi. Decompressed ureters. Bladder wall thickening with minimal layering debris. Mild perivesicular fat stranding. Stomach/Bowel: The stomach is unremarkable. Administered enteric contrast reaches the mid small bowel. There is no small bowel obstruction or small bowel inflammation. Appendix is not visualized, no appendicitis. Fluid-filled hepatic flexure, transverse, and distal colon. There is associated colonic wall thickening at the splenic flexure, descending, and sigmoid colon with mild pericolonic edema. Sigmoid colonic diverticulosis without focal diverticulitis. Circumferential rectal wall thickening with moderate perirectal edema. There is no bowel pneumatosis or perforation. Vascular/Lymphatic: Advanced aortic and branch atherosclerosis. No aortic aneurysm. Small retroperitoneal  lymph nodes, not enlarged by size criteria. Reproductive: Uterus is surgically absent.  No adnexal mass. Other: There is no intra-abdominopelvic mass or cyst corresponding to that seen on recent ultrasound. No ascites or free fluid. No free air. No focal fluid collection. There is a small fat containing umbilical hernia. Scattered subcutaneous edema of the subcutaneous tissues. Postsurgical change of the lower abdominal wall. Musculoskeletal: L4-L5 posterior lumbar fusion. Bones diffusely under mineralized. Degenerative change in the lumbar spine. Prominent Schmorl's node in the superior endplate of L1. IMPRESSION: 1. No intra-abdominopelvic cyst or cystic mass to correlate with recent ultrasound. 2. Colonic wall thickening with pericolonic edema at the splenic flexure, descending, and sigmoid colon, consistent with colitis. Sigmoid colonic diverticulosis without focal diverticulitis. 3. Prominent rectal wall thickening and perirectal edema. 4. Bladder wall thickening with minimal layering debris, can be seen with urinary tract renal cysts, as seen on recent ultrasound. 5. Small right pleural effusion. 6. Advanced aortic and branch atherosclerosis. Aortic Atherosclerosis (ICD10-I70.0). Electronically Signed   By: Keith Rake M.D.   On: 12/22/2020 18:59   CT Head Wo Contrast  Result Date: 12/14/2020 CLINICAL DATA:  Weakness low hemoglobin EXAM: CT HEAD WITHOUT CONTRAST TECHNIQUE: Contiguous axial images were obtained from the base of the skull through the vertex without intravenous contrast. COMPARISON:  MRI 11/30/2020, CT brain 11/28/2020 FINDINGS: Brain: No acute territorial infarction, hemorrhage or intracranial mass. Atrophy and chronic small vessel ischemic changes of the white matter. Chronic infarcts involving left thalamus, left basal ganglia and left frontal white matter with ex vacuo dilatation of the ventricle. Chronic lacunar infarct in the right posterior internal capsule. Vascular: No  hyperdense vessels. Vertebral and carotid vascular calcification Skull: Normal. Negative for fracture or focal lesion. Sinuses/Orbits: Mild mucosal thickening in the sinuses. Chronic deformity medial wall right orbit. Other: None IMPRESSION: 1. No CT evidence for acute intracranial abnormality. 2. Atrophy and chronic small vessel ischemic changes of the white matter. Multiple chronic infarcts as described above. Electronically Signed   By: Madie Reno.D.  On: 12/14/2020 21:20   US RENAL  Result Date: 12/16/2020 CLINICAL DATA:  Acute kidney injury. EXAM: RENAL / URINARY TRACT ULTRASOUND COMPLETE COMPARISON:  Dec 02, 2020. FINDINGS: Right Kidney: Renal measurements: 11.0 x 5.0 x 4.5 cm = volume: 129 mL. 2.5 cm simple cyst is seen in midpole cortex. Increased echogenicity of renal parenchyma is noted. No mass or hydronephrosis visualized. Left Kidney: Renal measurements: 10.0 x 6.0 x 4.6 cm = volume: 144 mL. 1.8 cm simple cyst is noted medially. Increased echogenicity of renal parenchyma is noted. No mass or hydronephrosis visualized. Bladder: Appears normal for degree of bladder distention. Bilateral ureteral jets are noted. Other: None. IMPRESSION: Increased echogenicity of renal parenchyma is noted suggesting medical renal disease. Bilateral simple renal cysts are noted. No hydronephrosis or renal obstruction is noted. Electronically Signed   By: Marijo Conception M.D.   On: 12/16/2020 14:36   US RENAL  Result Date: 12/02/2020 CLINICAL DATA:  Acute kidney injury EXAM: RENAL / URINARY TRACT ULTRASOUND COMPLETE COMPARISON:  10/15/2016 CTA FINDINGS: Right Kidney: Renal measurements: 10.1 x 5.3 x 5.2 cm = volume: 144 mL. slight increased echogenicity compatible with medical renal disease. Mid pole anechoic cortical cyst measures 2.7 cm. No hydronephrosis or acute finding. Left Kidney: Renal measurements: 9.3 x 5.5 x 6.1 cm = volume: 162 mL. Slight increased cortical echogenicity. No focal abnormality or  hydronephrosis. No acute finding. Bladder: Appears normal for degree of bladder distention. Left ureteral jet demonstrated. Other: No free fluid. IMPRESSION: Increased renal echogenicity compatible with medical renal disease. 2.7 cm right kidney midpole cortical cyst No acute finding by ultrasound Electronically Signed   By: Jerilynn Mages.  Shick M.D.   On: 12/02/2020 11:26   US RENAL ARTERY DUPLEX COMPLETE  Result Date: 12/22/2020 CLINICAL DATA:  Renal vascular hypertension EXAM: RENAL/URINARY TRACT ULTRASOUND RENAL DUPLEX DOPPLER ULTRASOUND COMPARISON:  Ultrasound kidneys 12/16/2020 FINDINGS: Right Kidney: Length: 10.2 cm. Mild increased cortical echogenicity without significant thinning. No mass or hydronephrosis. Two simple right renal cysts with the larger measuring 2.4 cm. Left Kidney: Length: 11.0 cm. Mild increased cortical echogenicity without significant thinning. No mass or hydronephrosis. Septated cyst noted in the mid left kidney measuring 1.9 cm. Bladder:  Not visualized. RENAL DUPLEX ULTRASOUND Right Renal Artery Velocities: Origin:  86 cm/sec Mid:  66 cm/sec [place]:  28 cm/sec Interlobar:  10 cm/sec Arcuate:  11 cm/sec Left Renal Artery Velocities: Origin:  57 cm/sec Mid:  69 cm/sec Hilum:  24 cm/sec Interlobar:  19 cm/sec Arcuate:  16 cm/sec Aortic Velocity: Unable to visualize aorta due to patient body habitus and shadowing bowel gas. Right Renal-Aortic Ratios: Unable to obtain as the aorta could not be visualized. Left Renal-Aortic Ratios: Unable to obtained as the aorta could not be visualized. IMPRESSION: 1. Renal artery velocities within normal limits. 2. Nonvisualization of the abdominal aorta, therefore renal artery-aorta ratios could not be measured. 3. Partially visualized cystic mass noted in the right abdomen measuring 10.8 x 5.6 cm is of uncertain etiology. Further evaluation with CT of the abdomen and pelvis should be performed to better evaluate this structure. These results will be called to  the ordering clinician or representative by the Radiologist Assistant, and communication documented in the PACS or [employer]. Electronically Signed   By: Miachel Roux M.D.   On: 12/22/2020 12:52      Assessment/Plan 1.  Renal failure.  Now felt to be end-stage renal disease.  PermCath is requested nephrology service and we will place today.  Husband agreed with consent.  She can have dialysis immediately after PermCath placement.  We would also be happy to see her in the office as an outpatient in a couple of weeks to perform vein mapping for permanent access creation. 2.  Hypertension.  Likely an underlying cause of her renal insufficiency and blood pressure control important in reducing the progression of atherosclerotic disease. On appropriate oral medications. 3.  Diabetes.  Likely an underlying cause of her renal insufficiency and blood glucose control important in reducing the progression of atherosclerotic disease. Also, involved in wound healing. On appropriate medications. 4.  Carotid disease with previous history of stroke/TIA.  Would be happy to follow in the future for this if need be.   Leotis Pain, MD  12/31/2020 8:08 AM    This note was created with Dragon medical transcription system.  Any error is purely unintentional

## 2021-01-01 DIAGNOSIS — N189 Chronic kidney disease, unspecified: Secondary | ICD-10-CM | POA: Diagnosis not present

## 2021-01-01 DIAGNOSIS — N179 Acute kidney failure, unspecified: Secondary | ICD-10-CM | POA: Diagnosis not present

## 2021-01-01 LAB — CBC WITH DIFFERENTIAL/PLATELET
Abs Immature Granulocytes: 0.04 10*3/uL (ref 0.00–0.07)
Basophils Absolute: 0.1 10*3/uL (ref 0.0–0.1)
Basophils Relative: 1 %
Eosinophils Absolute: 0.5 10*3/uL (ref 0.0–0.5)
Eosinophils Relative: 7 %
HCT: 26.7 % — ABNORMAL LOW (ref 36.0–46.0)
Hemoglobin: 8.8 g/dL — ABNORMAL LOW (ref 12.0–15.0)
Immature Granulocytes: 1 %
Lymphocytes Relative: 17 %
Lymphs Abs: 1.2 10*3/uL (ref 0.7–4.0)
MCH: 29.5 pg (ref 26.0–34.0)
MCHC: 33 g/dL (ref 30.0–36.0)
MCV: 89.6 fL (ref 80.0–100.0)
Monocytes Absolute: 0.8 10*3/uL (ref 0.1–1.0)
Monocytes Relative: 12 %
Neutro Abs: 4.4 10*3/uL (ref 1.7–7.7)
Neutrophils Relative %: 62 %
Platelets: 247 10*3/uL (ref 150–400)
RBC: 2.98 MIL/uL — ABNORMAL LOW (ref 3.87–5.11)
RDW: 14.4 % (ref 11.5–15.5)
WBC: 7 10*3/uL (ref 4.0–10.5)
nRBC: 0 % (ref 0.0–0.2)

## 2021-01-01 LAB — BASIC METABOLIC PANEL
Anion gap: 5 (ref 5–15)
BUN: 49 mg/dL — ABNORMAL HIGH (ref 8–23)
CO2: 24 mmol/L (ref 22–32)
Calcium: 8.4 mg/dL — ABNORMAL LOW (ref 8.9–10.3)
Chloride: 109 mmol/L (ref 98–111)
Creatinine, Ser: 2.77 mg/dL — ABNORMAL HIGH (ref 0.44–1.00)
GFR, Estimated: 19 mL/min — ABNORMAL LOW (ref >60)
Glucose, Bld: 234 mg/dL — ABNORMAL HIGH (ref 70–99)
Potassium: 4.5 mmol/L (ref 3.5–5.1)
Sodium: 138 mmol/L (ref 135–145)

## 2021-01-01 LAB — GLUCOSE, CAPILLARY
Glucose-Capillary: 220 mg/dL — ABNORMAL HIGH (ref 70–99)
Glucose-Capillary: 308 mg/dL — ABNORMAL HIGH (ref 70–99)
Glucose-Capillary: 186 mg/dL — ABNORMAL HIGH (ref 70–99)

## 2021-01-01 LAB — HEPATITIS B SURFACE ANTIBODY,QUALITATIVE: Hep B S Ab: NONREACTIVE

## 2021-01-01 LAB — HEPATITIS B SURFACE ANTIGEN: Hepatitis B Surface Ag: NONREACTIVE

## 2021-01-01 LAB — HEPATITIS B CORE ANTIBODY, IGM: Hep B C IgM: NONREACTIVE

## 2021-01-01 MED ORDER — LEVETIRACETAM 250 MG PO TABS
250.0000 mg | ORAL_TABLET | Freq: Once | ORAL | Status: AC
  Administered 2021-01-01: 250 mg via ORAL
  Filled 2021-01-01: qty 1

## 2021-01-01 NOTE — Progress Notes (Signed)
[facility]  ROUNDING NOTE   Subjective:     Patient seen and examined on second hemodialysis treatment. Tolerating treatment well.    HEMODIALYSIS FLOWSHEET:  Blood Flow Rate (mL/min): 250 mL/min Arterial Pressure (mmHg): -90 mmHg Venous Pressure (mmHg): 50 mmHg Transmembrane Pressure (mmHg): 60 mmHg Ultrafiltration Rate (mL/min): 400 mL/min Dialysate Flow Rate (mL/min): 600 ml/min Conductivity: Machine : 14 Conductivity: Machine : 14    Objective:  Vital signs in last 24 hours:  Temp:  [97.6 F (36.4 C)-98.6 F (37 C)] 98.4 F (36.9 C) (06/18 1135) Pulse Rate:  [59-74] 71 (06/18 1400) Resp:  [14-23] 19 (06/18 1400) BP: (134-191)/(52-77) 167/59 (06/18 1400) SpO2:  [98 %-100 %] 100 % (06/18 1400) Weight:  [93.2 kg] 93.2 kg (06/18 0412)  Weight change:  Filed Weights   12/28/20 0426 12/30/20 0346 01/01/21 0412  Weight: 92.4 kg 93.4 kg 93.2 kg    Intake/Output: I/O last 3 completed shifts: In: 480 [P.O.:480] Out: 2300 [Urine:1800; Other:500]   Intake/Output this shift:  Total I/O In: 240 [P.O.:240] Out: -   Physical Exam: General: Laying in bed  Head: Normocephalic, atraumatic.moist oral mucosal membranes  Eyes: Anicteric  Lungs:  Clear    Heart: regular  Abdomen:  Soft, nontender, non distended  Extremities:  no peripheral edema.  Neurologic:  l awake, alert, conversant  Skin: No acute lesions or rashes       Basic Metabolic Panel: Recent Labs  Lab 12/28/20 0620 12/29/20 0629 12/30/20 0540 12/31/20 0440 01/01/21 0405  NA 139 138 138 139 138  K 4.6 4.7 4.6 5.0 4.5  CL 108 107 110 111 109  CO2 21* 20* 22 20* 24  GLUCOSE 161* 233* 169* 217* 234*  BUN 70* 66* 66* 72* 49*  CREATININE 3.85* 4.05* 3.70* 3.95* 2.77*  CALCIUM 8.7* 8.4* 8.5* 8.5* 8.4*     Liver Function Tests: No results for input(s): AST, ALT, ALKPHOS, BILITOT, PROT, ALBUMIN in the last 168 hours. No results for input(s): LIPASE, AMYLASE in the last 168 hours. No  results for input(s): AMMONIA in the last 168 hours.  CBC: Recent Labs  Lab 12/28/20 0620 12/29/20 0629 12/30/20 0540 12/31/20 0440 01/01/21 0405  WBC 5.3 5.2 6.3 5.5 7.0  NEUTROABS 2.6 2.6 3.5 3.2 4.4  HGB 9.1* 8.9* 9.0* 8.2* 8.8*  HCT 27.8* 26.2* 27.3* 24.6* 26.7*  MCV 88.0 86.8 88.3 89.1 89.6  PLT 249 261 255 250 247     Cardiac Enzymes: No results for input(s): CKTOTAL, CKMB, CKMBINDEX, TROPONINI in the last 168 hours.  BNP: Invalid input(s): POCBNP  CBG: Recent Labs  Lab 12/30/20 2154 12/31/20 1415 12/31/20 1623 12/31/20 2227 01/01/21 0748  GLUCAP 269* 147* 134* 239* 220*     Microbiology: Results for orders placed or performed during the hospital encounter of 12/14/20  SARS CORONAVIRUS 2 (TAT 6-24 HRS) Nasopharyngeal Nasopharyngeal Swab     Status: None   Collection Time: 12/14/20  8:28 PM   Specimen: Nasopharyngeal Swab  Result Value Ref Range Status   SARS Coronavirus 2 NEGATIVE NEGATIVE Final    Comment: (NOTE) SARS-CoV-2 target nucleic acids are NOT DETECTED.  The SARS-CoV-2 RNA is generally detectable in upper and lower respiratory specimens during the acute phase of infection. Negative results do not preclude SARS-CoV-2 infection, do not rule out co-infections with other pathogens, and should not be used as the sole basis for treatment or other patient management decisions. Negative results must be combined with clinical observations, patient history, and epidemiological information.  The expected result is Negative.  Fact Sheet for Patients: SugarRoll.be  Fact Sheet for Healthcare Providers: https://www.woods-mathews.com/  This test is not yet approved or cleared by the [place] FDA and  has been authorized for detection and/or diagnosis of SARS-CoV-2 by FDA under an Emergency Use Authorization (EUA). This EUA will remain  in effect (meaning this test can be used) for the duration of  the COVID-19 declaration under Se ction 564(b)(1) of the Act, 21 U.S.C. section 360bbb-3(b)(1), unless the authorization is terminated or revoked sooner.  Performed at [facility], [place] [address] [place], [place] [address]   SARS CORONAVIRUS 2 (TAT 6-24 HRS) Nasopharyngeal Nasopharyngeal Swab     Status: None   Collection Time: 12/19/20  4:40 PM   Specimen: Nasopharyngeal Swab  Result Value Ref Range Status   SARS Coronavirus 2 NEGATIVE NEGATIVE Final    Comment: (NOTE) SARS-CoV-2 target nucleic acids are NOT DETECTED.  The SARS-CoV-2 RNA is generally detectable in upper and lower respiratory specimens during the acute phase of infection. Negative results do not preclude SARS-CoV-2 infection, do not rule out co-infections with other pathogens, and should not be used as the sole basis for treatment or other patient management decisions. Negative results must be combined with clinical observations, patient history, and epidemiological information. The expected result is Negative.  Fact Sheet for Patients: SugarRoll.be  Fact Sheet for Healthcare Providers: https://www.woods-mathews.com/  This test is not yet approved or cleared by the [place] FDA and  has been authorized for detection and/or diagnosis of SARS-CoV-2 by FDA under an Emergency Use Authorization (EUA). This EUA will remain  in effect (meaning this test can be used) for the duration of the COVID-19 declaration under Se ction 564(b)(1) of the Act, 21 U.S.C. section 360bbb-3(b)(1), unless the authorization is terminated or revoked sooner.  Performed at [facility], [place] [address] [place], [place] [address]     Coagulation Studies: No results for input(s): LABPROT, INR in the last 72 hours.  Urinalysis: No results for input(s): COLORURINE, LABSPEC, PHURINE, GLUCOSEU, HGBUR, BILIRUBINUR, KETONESUR, PROTEINUR, UROBILINOGEN, NITRITE, LEUKOCYTESUR in the  last 72 hours.  Invalid input(s): APPERANCEUR    Imaging: PERIPHERAL VASCULAR CATHETERIZATION  Result Date: 12/31/2020 See surgical note for result.    Medications:    sodium chloride     sodium chloride     sodium chloride      amLODipine  10 mg Oral Daily   aspirin  81 mg Oral Daily   atorvastatin  20 mg Oral QHS   Chlorhexidine Gluconate Cloth  6 each Topical Q0600   cloNIDine  0.3 mg Oral BID   clopidogrel  75 mg Oral Daily   gabapentin  100 mg Oral QHS   heparin  5,000 Units Subcutaneous Q8H   hydrALAZINE  100 mg Oral Q6H   insulin aspart  0-15 Units Subcutaneous TID WC   insulin aspart  3 Units Subcutaneous TID WC   insulin glargine  10 Units Subcutaneous QHS   isosorbide mononitrate  60 mg Oral Daily   levETIRAcetam  500 mg Oral Q24H   metoprolol tartrate  50 mg Oral BID   multivitamin with minerals  1 tablet Oral Daily   nystatin  1 application Topical BID   pantoprazole  40 mg Oral Daily   polyethylene glycol  17 g Oral Daily   senna-docusate  2 tablet Oral BID   vitamin B-12  250 mcg Oral Daily   sodium chloride, sodium chloride, acetaminophen **  OR** acetaminophen, alteplase, heparin, HYDROcodone-acetaminophen, hydrocortisone, iohexol, lidocaine (PF), lidocaine-prilocaine, ondansetron **OR** ondansetron (ZOFRAN) IV, pentafluoroprop-tetrafluoroeth, senna-docusate  Assessment/ Plan:  [name] is a 65 y.o. white female with diabetes mellitus type II, hypertension, CVA with residual left hemiplegia, hyperlipidemia, congestive heart failure who was admitted to [facility] on 12/14/2020 for AKI (acute kidney injury) ([place]) [N17.9] Altered mental status, unspecified altered mental status type [R41.82] Acute kidney injury superimposed on chronic kidney disease ([place]) [T25.4, D82.6]  Complicated and prolonged hospitalization. Initiated on hemodialysis yesterday.   Acute Kidney Injury on chronic kidney disease stage IV with baseline creatinine 2.32 and GFR of 23  on 12/03/20. Nephrotic range proteinuria.  Acute kidney injury secondary to ATN, prerenal azotemia and uncontrolled hypertension Chronic kidney disease is secondary to diabetic nephropathy.  Holding enalapril - Seen and examined on second hemodialysis treatment.  - monitor for dialysis need and renal recovery.   Lab Results  Component Value Date   CREATININE 2.77 (H) 01/01/2021   CREATININE 3.95 (H) 12/31/2020   CREATININE 3.70 (H) 12/30/2020    Intake/Output Summary (Last 24 hours) at 01/01/2021 1438 Last data filed at 01/01/2021 0900 Gross per 24 hour  Intake 720 ml  Output 900 ml  Net -180 ml     2. Hypertension: 167/59 - elevated.  Current regimen of amlodipine, clonidine, hydralazine, and metoprolol.  3. 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  Hgb A1c 8.6% on 11/29/20  4. Anemia of CKD Lab Results  Component Value Date   HGB 8.8 (L) 01/01/2021  Check iron studies.    LOS: [place] 6/18/20222:38 PM

## 2021-01-01 NOTE — Progress Notes (Signed)
PROGRESS NOTE    Susan House  BHA:[id number] DOB: Feb 14, 1956 DOA: 12/14/2020 PCP: Jerrol Banana., MD   Brief Narrative:  65 y.o. female with medical history significant for history of CVA with residual left-sided weakness, left frontal encephalomalacia, CKD stage IIIb-IV, insulin-dependent type 2 diabetes, hypertension, hyperlipidemia, seizure disorder, and memory loss who presents to the ED for evaluation of generalized weakness and abnormal labs.   Patient recently admitted 11/28/2020-12/03/2020 for AKI on CKD managed with IV fluid hydration.  Renal ultrasound showed increased echogenicity compatible with medical renal disease and a 2.7 cm right kidney cyst.  She was discharged to SNF.   Patient states that she was sent to the ED due to abnormal labs.  Found to have AKI on CKD and evidence of urinary tract infection.   Kidney function appears to have peaked at 3.44.  Now slightly downtrending.  Mental status also slowly improving.  Mental status has been waxing and waning.  Discussed this with the patient's daughter.  I feel this is a progression of underlying vascular dementia and patient if may be established a new psychiatric baseline.   Unfortunately blood pressure has been very difficult to control.  Intravenous fluids were discontinued on 6/5 and patient had subsequent deterioration kidney function 6/6.  Nephrology following.   6/7-6/13: Renal function continues to get worse although still making urine so nephrology not ready for dialysis yet but not able to discharge either as her kidney function has not returned to baseline.  She required 1 PRBC transfusion on 6/12 for hemoglobin of 6.9 with appropriate response  6/14: Blood pressure improved control over interval though still not ideal.  Kidney function slowly improving.  Fluids restarted 6/13.  6/15: Kidney function is deteriorating despite gentle IV fluid resuscitation.  Case discussed with nephrology.  Lengthy  conversation with the husband and patient at bedside.  Likely that hemodialysis would be indicated.  Still questionable whether this is within the patient's and family's goals of care. 6/16: Had a lengthy conversation with the patient at bedside this morning.  I explained that hemodialysis was recommended given her deteriorating kidney function and lack of appreciable improvement.  After nephrology follow-up patient now agrees to have PermCath placed 6/17: PermCath placed and hemodialysis initiated today.  Patient seems to be tolerating well.    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 Renal function worsened today Similar to previous presentation likely etiology is ATN in the setting of poorly controlled hypertension as well as possible relative dehydration/weight depletion  Nephrology following.   Renal ultrasound with Doppler rule showed 10.8 x 5.6 cm cystic mass on the right side.  CT abdomen pelvis did not show this mass.  Showed some colitis but she is not having any symptoms of colitis so we will hold off treatment.  Also diverticulosis but no diverticulitis.  After persistent deterioration of kidney function decision made to start hemodialysis.  PermCath placed 6/17.  Hemodialysis to be initiated after HD: 6/17 Plan: Nephrology following for inpatient hemodialysis needs.  Patient will need outpatient  hemodialysis chair and time prior to discharge planning.   Refractory hypertension  Continue current regimen of  amlodipine, clonidine, hydralazine, metoprolol, and are Vasotec currently on hold Should be able to titrate blood pressure regimen with 1 or 2 more rounds of hemodialysis   Urinary tract infection Completed 3 days of Rocephin.   Anemia of chronic kidney disease and B12 deficiency Hemoglobin 6.9 on 6/13 - s/p 1 PRBC for transfusion and Hb 8.8  Patient denies any obvious bleeding.  Continue B12 supplement and monitor for any signs/symptoms of bleeding.   Insulin-dependent type 2 diabetes A1c 8.6% Lantus 10 units nightly NovoLog 3 units 3 times daily with meals Moderate sliding scale Carb modified diet   History of CVA with residual left-sided weakness/left frontal encephalomalacia Continue aspirin, Plavix, atorvastatin.   Seizure disorder Continue Keppra Seizure precaution   Memory loss Suspected underlying vascular dementia.   She had some reported confusion/delirium prior to arrival.   Baseline level of mentation is unclear CT head significant for chronic volume loss and small vessel ischemic changes consistent with suspected vascular dementia Outpatient follow-up   Constipation Continue Senokot-S and MiraLAX   DVT prophylaxis: SQ heparin Code Status: DNR Family Communication: Daughter [name] [phone] on 6/16 Disposition Plan: Status is: Inpatient  Remains inpatient appropriate because:Inpatient level of care appropriate due to severity of illness  Dispo: The patient is from: SNF              Anticipated d/c is to: SNF              Patient currently is not medically stable to d/c.   Difficult to place patient No     Level of care: Med-Surg  Consultants:  Nephrology  Procedures:  None  Antimicrobials:  None   Subjective: Patient seen and examined.  Tolerated HD on 617 without issue.  Objective: Vitals:   12/31/20 2031 01/01/21 0412 01/01/21 0718 01/01/21 0956  BP:  (!) 159/60 (!) 191/69 (!) 145/52  Pulse:  64 66 61  Resp: 16 16 17    Temp:  97.6 F (36.4 C) 97.7 F (36.5 C) 98.5 F (36.9 C) 98.4 F (36.9 C)  TempSrc: Oral Oral Oral Oral  SpO2: 99% 99% 99% 100%  Weight:  93.2 kg    Height:        Intake/Output Summary (Last 24 hours) at 01/01/2021 1130 Last data filed at 01/01/2021 0900 Gross per 24 hour  Intake 720 ml  Output 1400 ml  Net -680 ml   Filed Weights   12/28/20 0426 12/30/20 0346 01/01/21 0412  Weight: 92.4 kg 93.4 kg 93.2 kg    Examination:  General exam: No acute distress Respiratory system: Poor respiratory effort.  Lungs clear.  Room air Cardiovascular system: S1-S2, regular rate and rhythm, no murmurs, trace pedal edema, PermCath in place right chest  gastrointestinal system: Abdomen is nondistended, soft and nontender. No organomegaly or masses felt. Normal bowel sounds heard. Central nervous system: Alert, oriented x2, no focal deficits Extremities: Symmetric 5 x 5 power. Skin: No rashes, lesions or ulcers Psychiatry: Judgement and insight appear impaired. Mood & affect flattened.     Data Reviewed: I have personally reviewed following labs and imaging studies  CBC: Recent Labs  Lab 12/28/20 0620 12/29/20 0629 12/30/20 0540 12/31/20 0440 01/01/21 0405  WBC 5.3 5.2 6.3 5.5 7.0  NEUTROABS 2.6 2.6 3.5 3.2 4.4  HGB 9.1* 8.9* 9.0* 8.2* 8.8*  HCT 27.8* 26.2* 27.3* 24.6* 26.7*  MCV 88.0 86.8 88.3 89.1  89.6  PLT 249 261 255 250 527   Basic Metabolic Panel: Recent Labs  Lab 12/28/20 0620 12/29/20 0629 12/30/20 0540 12/31/20 0440 01/01/21 0405  NA 139 138 138 139 138  K 4.6 4.7 4.6 5.0 4.5  CL 108 107 110 111 109  CO2 21* 20* 22 20* 24  GLUCOSE 161* 233* 169* 217* 234*  BUN 70* 66* 66* 72* 49*  CREATININE 3.85* 4.05* 3.70* 3.95* 2.77*  CALCIUM 8.7* 8.4* 8.5* 8.5* 8.4*   GFR: Estimated Creatinine Clearance: 21.8 mL/min (A) (by C-G formula based on SCr of 2.77 mg/dL (H)). Liver Function Tests: No results for input(s): AST, ALT, ALKPHOS, BILITOT, PROT, ALBUMIN in the last 168  hours. No results for input(s): LIPASE, AMYLASE in the last 168 hours. No results for input(s): AMMONIA in the last 168 hours. Coagulation Profile: No results for input(s): INR, PROTIME in the last 168 hours. Cardiac Enzymes: No results for input(s): CKTOTAL, CKMB, CKMBINDEX, TROPONINI in the last 168 hours. BNP (last 3 results) No results for input(s): PROBNP in the last 8760 hours. HbA1C: No results for input(s): HGBA1C in the last 72 hours. CBG: Recent Labs  Lab 12/30/20 2154 12/31/20 1415 12/31/20 1623 12/31/20 2227 01/01/21 0748  GLUCAP 269* 147* 134* 239* 220*   Lipid Profile: No results for input(s): CHOL, HDL, LDLCALC, TRIG, CHOLHDL, LDLDIRECT in the last 72 hours. Thyroid Function Tests: No results for input(s): TSH, T4TOTAL, FREET4, T3FREE, THYROIDAB in the last 72 hours. Anemia Panel: No results for input(s): VITAMINB12, FOLATE, FERRITIN, TIBC, IRON, RETICCTPCT in the last 72 hours. Sepsis Labs: No results for input(s): PROCALCITON, LATICACIDVEN in the last 168 hours.  No results found for this or any previous visit (from the past 240 hour(s)).        Radiology Studies: PERIPHERAL VASCULAR CATHETERIZATION  Result Date: 12/31/2020 See surgical note for result.       Scheduled Meds:  amLODipine  10 mg Oral Daily   aspirin  81 mg Oral Daily   atorvastatin  20 mg Oral QHS   Chlorhexidine Gluconate Cloth  6 each Topical Q0600   cloNIDine  0.3 mg Oral BID   clopidogrel  75 mg Oral Daily   gabapentin  100 mg Oral QHS   heparin  5,000 Units Subcutaneous Q8H   hydrALAZINE  100 mg Oral Q6H   insulin aspart  0-15 Units Subcutaneous TID WC   insulin aspart  3 Units Subcutaneous TID WC   insulin glargine  10 Units Subcutaneous QHS   isosorbide mononitrate  60 mg Oral Daily   levETIRAcetam  500 mg Oral Q24H   metoprolol tartrate  50 mg Oral BID   multivitamin with minerals  1 tablet Oral Daily   nystatin  1 application Topical BID   pantoprazole  40 mg  Oral Daily   polyethylene glycol  17 g Oral Daily   senna-docusate  2 tablet Oral BID   vitamin B-12  250 mcg Oral Daily   Continuous Infusions:  sodium chloride     sodium chloride     sodium chloride       LOS: 16 days    Time spent: 15 minutes    Sidney Ace, MD [facility] Pager [phone]  If 7PM-7AM, please contact night-coverage  01/01/2021, 11:30 AM

## 2021-01-01 NOTE — Progress Notes (Signed)
[facility]  Pt ran full tx, uneventful. BFR 250,  500 ml removed as ordered. Catheter site no s/s of infection.  Ronnie Derby

## 2021-01-02 DIAGNOSIS — N189 Chronic kidney disease, unspecified: Secondary | ICD-10-CM | POA: Diagnosis not present

## 2021-01-02 DIAGNOSIS — N179 Acute kidney failure, unspecified: Secondary | ICD-10-CM | POA: Diagnosis not present

## 2021-01-02 LAB — CBC WITH DIFFERENTIAL/PLATELET
Abs Immature Granulocytes: 0.02 10*3/uL (ref 0.00–0.07)
Basophils Absolute: 0.1 10*3/uL (ref 0.0–0.1)
Basophils Relative: 1 %
Eosinophils Absolute: 0.4 10*3/uL (ref 0.0–0.5)
Eosinophils Relative: 8 %
HCT: 27.8 % — ABNORMAL LOW (ref 36.0–46.0)
Hemoglobin: 9 g/dL — ABNORMAL LOW (ref 12.0–15.0)
Immature Granulocytes: 0 %
Lymphocytes Relative: 26 %
Lymphs Abs: 1.4 10*3/uL (ref 0.7–4.0)
MCH: 28.3 pg (ref 26.0–34.0)
MCHC: 32.4 g/dL (ref 30.0–36.0)
MCV: 87.4 fL (ref 80.0–100.0)
Monocytes Absolute: 0.7 10*3/uL (ref 0.1–1.0)
Monocytes Relative: 13 %
Neutro Abs: 2.8 10*3/uL (ref 1.7–7.7)
Neutrophils Relative %: 52 %
Platelets: 256 10*3/uL (ref 150–400)
RBC: 3.18 MIL/uL — ABNORMAL LOW (ref 3.87–5.11)
RDW: 14.2 % (ref 11.5–15.5)
WBC: 5.5 10*3/uL (ref 4.0–10.5)
nRBC: 0 % (ref 0.0–0.2)

## 2021-01-02 LAB — GLUCOSE, CAPILLARY
Glucose-Capillary: 188 mg/dL — ABNORMAL HIGH (ref 70–99)
Glucose-Capillary: 182 mg/dL — ABNORMAL HIGH (ref 70–99)
Glucose-Capillary: 154 mg/dL — ABNORMAL HIGH (ref 70–99)
Glucose-Capillary: 162 mg/dL — ABNORMAL HIGH (ref 70–99)

## 2021-01-02 LAB — BASIC METABOLIC PANEL
Anion gap: 4 — ABNORMAL LOW (ref 5–15)
BUN: 31 mg/dL — ABNORMAL HIGH (ref 8–23)
CO2: 28 mmol/L (ref 22–32)
Calcium: 8.5 mg/dL — ABNORMAL LOW (ref 8.9–10.3)
Chloride: 109 mmol/L (ref 98–111)
Creatinine, Ser: 1.95 mg/dL — ABNORMAL HIGH (ref 0.44–1.00)
GFR, Estimated: 28 mL/min — ABNORMAL LOW (ref >60)
Glucose, Bld: 187 mg/dL — ABNORMAL HIGH (ref 70–99)
Potassium: 4.2 mmol/L (ref 3.5–5.1)
Sodium: 141 mmol/L (ref 135–145)

## 2021-01-02 LAB — IRON AND TIBC
Iron: 46 ug/dL (ref 28–170)
Saturation Ratios: 19 % (ref 10.4–31.8)
TIBC: 241 ug/dL — ABNORMAL LOW (ref 250–450)
UIBC: 195 ug/dL

## 2021-01-02 LAB — FERRITIN: Ferritin: 40 ng/mL (ref 11–307)

## 2021-01-02 MED ORDER — SODIUM CHLORIDE 0.9 % IV SOLN
100.0000 mg | INTRAVENOUS | Status: AC
  Administered 2021-01-02 – 2021-01-06 (×5): 100 mg via INTRAVENOUS
  Filled 2021-01-02 (×6): qty 5

## 2021-01-02 NOTE — Progress Notes (Signed)
[facility]  ROUNDING NOTE   Subjective:   Husband at bedside. Hemodialysis treatment yesterday. UOP 1135mL. UF of 530mL.    Objective:  Vital signs in last 24 hours:  Temp:  [98 F (36.7 C)-98.7 F (37.1 C)] 98 F (36.7 C) (06/19 1120) Pulse Rate:  [61-74] 64 (06/19 1120) Resp:  [14-23] 18 (06/19 1120) BP: (127-197)/(54-77) 127/54 (06/19 1120) SpO2:  [98 %-100 %] 99 % (06/19 1120) Weight:  [93 kg] 93 kg (06/19 0553)  Weight change: -0.2 kg Filed Weights   12/30/20 0346 01/01/21 0412 01/02/21 0553  Weight: 93.4 kg 93.2 kg 93 kg    Intake/Output: I/O last 3 completed shifts: In: 480 [P.O.:480] Out: 2550 [Urine:2050; Other:500]   Intake/Output this shift:  Total I/O In: 240 [P.O.:240] Out: -   Physical Exam: General: Laying in bed  Head: Normocephalic, atraumatic.moist oral mucosal membranes  Eyes: Anicteric  Lungs:  Clear    Heart: regular  Abdomen:  Soft, nontender, non distended  Extremities:  no peripheral edema.  Neurologic:  Alert to self and place.   Skin: No acute lesions or rashes       Basic Metabolic Panel: Recent Labs  Lab 12/29/20 0629 12/30/20 0540 12/31/20 0440 01/01/21 0405 01/02/21 0453  NA 138 138 139 138 141  K 4.7 4.6 5.0 4.5 4.2  CL 107 110 111 109 109  CO2 20* 22 20* 24 28  GLUCOSE 233* 169* 217* 234* 187*  BUN 66* 66* 72* 49* 31*  CREATININE 4.05* 3.70* 3.95* 2.77* 1.95*  CALCIUM 8.4* 8.5* 8.5* 8.4* 8.5*     Liver Function Tests: No results for input(s): AST, ALT, ALKPHOS, BILITOT, PROT, ALBUMIN in the last 168 hours. No results for input(s): LIPASE, AMYLASE in the last 168 hours. No results for input(s): AMMONIA in the last 168 hours.  CBC: Recent Labs  Lab 12/29/20 0629 12/30/20 0540 12/31/20 0440 01/01/21 0405 01/02/21 0453  WBC 5.2 6.3 5.5 7.0 5.5  NEUTROABS 2.6 3.5 3.2 4.4 2.8  HGB 8.9* 9.0* 8.2* 8.8* 9.0*  HCT 26.2* 27.3* 24.6* 26.7* 27.8*  MCV 86.8 88.3 89.1 89.6 87.4  PLT 261 255 250 247  256     Cardiac Enzymes: No results for input(s): CKTOTAL, CKMB, CKMBINDEX, TROPONINI in the last 168 hours.  BNP: Invalid input(s): POCBNP  CBG: Recent Labs  Lab 01/01/21 0748 01/01/21 1634 01/01/21 2058 01/02/21 0756 01/02/21 1118  GLUCAP 220* 308* 186* 188* 182*     Microbiology: Results for orders placed or performed during the hospital encounter of 12/14/20  SARS CORONAVIRUS 2 (TAT 6-24 HRS) Nasopharyngeal Nasopharyngeal Swab     Status: None   Collection Time: 12/14/20  8:28 PM   Specimen: Nasopharyngeal Swab  Result Value Ref Range Status   SARS Coronavirus 2 NEGATIVE NEGATIVE Final    Comment: (NOTE) SARS-CoV-2 target nucleic acids are NOT DETECTED.  The SARS-CoV-2 RNA is generally detectable in upper and lower respiratory specimens during the acute phase of infection. Negative results do not preclude SARS-CoV-2 infection, do not rule out co-infections with other pathogens, and should not be used as the sole basis for treatment or other patient management decisions. Negative results must be combined with clinical observations, patient history, and epidemiological information. The expected result is Negative.  Fact Sheet for Patients: SugarRoll.be  Fact Sheet for Healthcare Providers: https://www.woods-mathews.com/  This test is not yet approved or cleared by the [place] FDA and  has been authorized for detection and/or diagnosis of SARS-CoV-2 by FDA  under an Emergency Use Authorization (EUA). This EUA will remain  in effect (meaning this test can be used) for the duration of the COVID-19 declaration under Se ction 564(b)(1) of the Act, 21 U.S.C. section 360bbb-3(b)(1), unless the authorization is terminated or revoked sooner.  Performed at [facility], [place] [address], [place], [place] [address]   SARS CORONAVIRUS 2 (TAT 6-24 HRS) Nasopharyngeal Nasopharyngeal Swab     Status: None   Collection  Time: 12/19/20  4:40 PM   Specimen: Nasopharyngeal Swab  Result Value Ref Range Status   SARS Coronavirus 2 NEGATIVE NEGATIVE Final    Comment: (NOTE) SARS-CoV-2 target nucleic acids are NOT DETECTED.  The SARS-CoV-2 RNA is generally detectable in upper and lower respiratory specimens during the acute phase of infection. Negative results do not preclude SARS-CoV-2 infection, do not rule out co-infections with other pathogens, and should not be used as the sole basis for treatment or other patient management decisions. Negative results must be combined with clinical observations, patient history, and epidemiological information. The expected result is Negative.  Fact Sheet for Patients: SugarRoll.be  Fact Sheet for Healthcare Providers: https://www.woods-mathews.com/  This test is not yet approved or cleared by the [place] FDA and  has been authorized for detection and/or diagnosis of SARS-CoV-2 by FDA under an Emergency Use Authorization (EUA). This EUA will remain  in effect (meaning this test can be used) for the duration of the COVID-19 declaration under Se ction 564(b)(1) of the Act, 21 U.S.C. section 360bbb-3(b)(1), unless the authorization is terminated or revoked sooner.  Performed at [facility], [place] [address] [place], [place] [address]     Coagulation Studies: No results for input(s): LABPROT, INR in the last 72 hours.  Urinalysis: No results for input(s): COLORURINE, LABSPEC, PHURINE, GLUCOSEU, HGBUR, BILIRUBINUR, KETONESUR, PROTEINUR, UROBILINOGEN, NITRITE, LEUKOCYTESUR in the last 72 hours.  Invalid input(s): APPERANCEUR    Imaging: No results found.   Medications:    sodium chloride     sodium chloride     sodium chloride      amLODipine  10 mg Oral Daily   aspirin  81 mg Oral Daily   atorvastatin  20 mg Oral QHS   Chlorhexidine Gluconate Cloth  6 each Topical Q0600   cloNIDine  0.3 mg Oral  BID   clopidogrel  75 mg Oral Daily   gabapentin  100 mg Oral QHS   heparin  5,000 Units Subcutaneous Q8H   hydrALAZINE  100 mg Oral Q6H   insulin aspart  0-15 Units Subcutaneous TID WC   insulin aspart  3 Units Subcutaneous TID WC   insulin glargine  10 Units Subcutaneous QHS   isosorbide mononitrate  60 mg Oral Daily   levETIRAcetam  500 mg Oral Q24H   metoprolol tartrate  50 mg Oral BID   multivitamin with minerals  1 tablet Oral Daily   nystatin  1 application Topical BID   pantoprazole  40 mg Oral Daily   polyethylene glycol  17 g Oral Daily   senna-docusate  2 tablet Oral BID   vitamin B-12  250 mcg Oral Daily   sodium chloride, sodium chloride, acetaminophen **OR** acetaminophen, alteplase, heparin, HYDROcodone-acetaminophen, hydrocortisone, iohexol, lidocaine (PF), lidocaine-prilocaine, ondansetron **OR** ondansetron (ZOFRAN) IV, pentafluoroprop-tetrafluoroeth, senna-docusate  Assessment/ Plan:  Ms. Leann Mayweather is a 65 y.o. white female with diabetes mellitus type II, hypertension, CVA with residual left hemiplegia, hyperlipidemia, congestive heart failure who was admitted to [facility] on 12/14/2020 for  AKI (acute kidney injury) ([place]) [N17.9] Altered mental status, unspecified altered mental status type [R41.82] Acute kidney injury superimposed on chronic kidney disease ([place]) [A67.7, J73.6]  Complicated and prolonged hospitalization. Initiated on hemodialysis yesterday.   Acute Kidney Injury on chronic kidney disease stage IV with baseline creatinine 2.32 and GFR of 23 on 12/03/20. Nephrotic range proteinuria.  Acute kidney injury secondary to ATN, prerenal azotemia and uncontrolled hypertension Chronic kidney disease is secondary to diabetic nephropathy.  Holding enalapril Nonoliguric urine output.  - monitor for dialysis need and renal recovery. Seems to now be starting to have recovery.   Lab Results  Component Value Date   CREATININE 1.95 (H) 01/02/2021    CREATININE 2.77 (H) 01/01/2021   CREATININE 3.95 (H) 12/31/2020    Intake/Output Summary (Last 24 hours) at 01/02/2021 1129 Last data filed at 01/02/2021 0900 Gross per 24 hour  Intake 480 ml  Output 1650 ml  Net -1170 ml     2. Hypertension: 127/54 . Volume status at goal.   Current regimen of amlodipine, clonidine, hydralazine, and metoprolol.  3. 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  Hgb A1c 8.6% on 11/29/20  4. Anemia of CKD with iron deficiency.  Lab Results  Component Value Date   HGB 9.0 (L) 01/02/2021  Start IV iron   LOS: 17 Susan House 6/19/202211:29 AM

## 2021-01-02 NOTE — Progress Notes (Signed)
PROGRESS NOTE    Susan House  GXQ:[id number] DOB: Nov 20, 1955 DOA: 12/14/2020 PCP: Jerrol Banana., MD   Brief Narrative:  65 y.o. female with medical history significant for history of CVA with residual left-sided weakness, left frontal encephalomalacia, CKD stage IIIb-IV, insulin-dependent type 2 diabetes, hypertension, hyperlipidemia, seizure disorder, and memory loss who presents to the ED for evaluation of generalized weakness and abnormal labs.   Patient recently admitted 11/28/2020-12/03/2020 for AKI on CKD managed with IV fluid hydration.  Renal ultrasound showed increased echogenicity compatible with medical renal disease and a 2.7 cm right kidney cyst.  She was discharged to SNF.   Patient states that she was sent to the ED due to abnormal labs.  Found to have AKI on CKD and evidence of urinary tract infection.   Kidney function appears to have peaked at 3.44.  Now slightly downtrending.  Mental status also slowly improving.  Mental status has been waxing and waning.  Discussed this with the patient's daughter.  I feel this is a progression of underlying vascular dementia and patient if may be established a new psychiatric baseline.   Unfortunately blood pressure has been very difficult to control.  Intravenous fluids were discontinued on 6/5 and patient had subsequent deterioration kidney function 6/6.  Nephrology following.   6/7-6/13: Renal function continues to get worse although still making urine so nephrology not ready for dialysis yet but not able to discharge either as her kidney function has not returned to baseline.  She required 1 PRBC transfusion on 6/12 for hemoglobin of 6.9 with appropriate response  6/14: Blood pressure improved control over interval though still not ideal.  Kidney function slowly improving.  Fluids restarted 6/13.  6/15: Kidney function is deteriorating despite gentle IV fluid resuscitation.  Case discussed with nephrology.  Lengthy  conversation with the husband and patient at bedside.  Likely that hemodialysis would be indicated.  Still questionable whether this is within the patient's and family's goals of care. 6/16: Had a lengthy conversation with the patient at bedside this morning.  I explained that hemodialysis was recommended given her deteriorating kidney function and lack of appreciable improvement.  After nephrology follow-up patient now agrees to have PermCath placed 6/17: PermCath placed and hemodialysis initiated today.  Patient seems to be tolerating well.    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 Renal function worsened today Similar to previous presentation likely etiology is ATN in the setting of poorly controlled hypertension as well as possible relative dehydration/weight depletion  Nephrology following.   Renal ultrasound with Doppler rule showed 10.8 x 5.6 cm cystic mass on the right side.  CT abdomen pelvis did not show this mass.  Showed some colitis but she is not having any symptoms of colitis so we will hold off treatment.  Also diverticulosis but no diverticulitis.  After persistent deterioration of kidney function decision made to start hemodialysis.  PermCath placed 6/17.  Hemodialysis to be initiated after HD: 6/17; 6/18 Plan: Nephrology follow-up for inpatient HD needs   Refractory hypertension  Continue current regimen of amlodipine, clonidine, hydralazine, metoprolol, and are Vasotec currently on hold Attempt to  titrate and de-escalate blood pressure regimen if BP allows   Urinary tract infection Completed 3 days of Rocephin.   Anemia of chronic kidney disease and B12 deficiency Hemoglobin 6.9 on 6/13 - s/p 1 PRBC for transfusion and Hb 8.8  Patient denies any obvious bleeding.  Continue B12 supplement and monitor for any signs/symptoms of bleeding.   Insulin-dependent type 2 diabetes A1c 8.6% Lantus 10 units nightly NovoLog 3 units 3 times daily with meals Moderate sliding scale Carb modified diet   History of CVA with residual left-sided weakness/left frontal encephalomalacia Continue aspirin, Plavix, atorvastatin.   Seizure disorder Continue Keppra Seizure precaution   Memory loss Suspected underlying vascular dementia.   She had some reported confusion/delirium prior to arrival.   Baseline level of mentation is unclear CT head significant for chronic volume loss and small vessel ischemic changes consistent with suspected vascular dementia Outpatient follow-up   Constipation Continue Senokot-S and MiraLAX   DVT prophylaxis: SQ heparin Code Status: DNR Family Communication: Daughter [name] [phone] on 6/16, 6/19 Disposition Plan: Status is: Inpatient  Remains inpatient appropriate because:Inpatient level of care appropriate due to severity of illness  Dispo: The patient is from: SNF              Anticipated d/c is to: SNF              Patient currently is not medically stable to d/c.   Difficult to place patient No  New start hemodialysis.  Nephrology following for inpatient HD needs and renal recovery.   Level of care: Med-Surg  Consultants:  Nephrology  Procedures:  None  Antimicrobials:  None   Subjective: Patient seen and examined.  A little more confused this morning but visibly no distress  Objective: Vitals:   01/01/21 2105 01/01/21 2246 01/02/21 0100 01/02/21 0553  BP: (!) 197/66 (!) 153/61 (!) 149/68 (!) 168/62  Pulse: 69 67 65 61  Resp: 20   18 18   Temp: 98.6 F (37 C)   98.7 F (37.1 C)  TempSrc: Oral   Oral  SpO2: 99%  99% 100%  Weight:    93 kg  Height:        Intake/Output Summary (Last 24 hours) at 01/02/2021 1000 Last data filed at 01/02/2021 0550 Gross per 24 hour  Intake 240 ml  Output 1650 ml  Net -1410 ml   Filed Weights   12/30/20 0346 01/01/21 0412 01/02/21 0553  Weight: 93.4 kg 93.2 kg 93 kg    Examination:  General exam: No acute distress Respiratory system: Poor respiratory effort.  Lungs clear.  Room air Cardiovascular system: S1-S2, regular rate and rhythm, no murmurs, trace pedal edema, PermCath in place right chest  gastrointestinal system: Abdomen is nondistended, soft and nontender. No organomegaly or masses felt. Normal bowel sounds heard. Central nervous system: Alert, oriented x2, no focal deficits Extremities: Symmetric 5 x 5 power. Skin: No rashes, lesions or ulcers Psychiatry: Judgement and insight appear impaired. Mood & affect flattened.     Data Reviewed: I have personally reviewed following labs and imaging studies  CBC: Recent Labs  Lab 12/29/20 0629 12/30/20 0540 12/31/20 0440 01/01/21 0405 01/02/21 0453  WBC 5.2 6.3 5.5 7.0 5.5  NEUTROABS 2.6 3.5 3.2 4.4 2.8  HGB 8.9* 9.0* 8.2* 8.8* 9.0*  HCT 26.2* 27.3* 24.6* 26.7* 27.8*  MCV 86.8 88.3 89.1 89.6 87.4  PLT 261 255 250  247 400   Basic Metabolic Panel: Recent Labs  Lab 12/29/20 0629 12/30/20 0540 12/31/20 0440 01/01/21 0405 01/02/21 0453  NA 138 138 139 138 141  K 4.7 4.6 5.0 4.5 4.2  CL 107 110 111 109 109  CO2 20* 22 20* 24 28  GLUCOSE 233* 169* 217* 234* 187*  BUN 66* 66* 72* 49* 31*  CREATININE 4.05* 3.70* 3.95* 2.77* 1.95*  CALCIUM 8.4* 8.5* 8.5* 8.4* 8.5*   GFR: Estimated Creatinine Clearance: 31 mL/min (A) (by C-G formula based on SCr of 1.95 mg/dL (H)). Liver Function Tests: No results for input(s): AST, ALT, ALKPHOS, BILITOT, PROT, ALBUMIN in the last 168 hours. No results for input(s):  LIPASE, AMYLASE in the last 168 hours. No results for input(s): AMMONIA in the last 168 hours. Coagulation Profile: No results for input(s): INR, PROTIME in the last 168 hours. Cardiac Enzymes: No results for input(s): CKTOTAL, CKMB, CKMBINDEX, TROPONINI in the last 168 hours. BNP (last 3 results) No results for input(s): PROBNP in the last 8760 hours. HbA1C: No results for input(s): HGBA1C in the last 72 hours. CBG: Recent Labs  Lab 12/31/20 2227 01/01/21 0748 01/01/21 1634 01/01/21 2058 01/02/21 0756  GLUCAP 239* 220* 308* 186* 188*   Lipid Profile: No results for input(s): CHOL, HDL, LDLCALC, TRIG, CHOLHDL, LDLDIRECT in the last 72 hours. Thyroid Function Tests: No results for input(s): TSH, T4TOTAL, FREET4, T3FREE, THYROIDAB in the last 72 hours. Anemia Panel: Recent Labs    01/02/21 0453  FERRITIN 40  TIBC 241*  IRON 46   Sepsis Labs: No results for input(s): PROCALCITON, LATICACIDVEN in the last 168 hours.  No results found for this or any previous visit (from the past 240 hour(s)).        Radiology Studies: No results found.      Scheduled Meds:  amLODipine  10 mg Oral Daily   aspirin  81 mg Oral Daily   atorvastatin  20 mg Oral QHS   Chlorhexidine Gluconate Cloth  6 each Topical Q0600   cloNIDine  0.3 mg Oral BID   clopidogrel  75 mg Oral Daily   gabapentin  100 mg Oral QHS   heparin  5,000 Units Subcutaneous Q8H   hydrALAZINE  100 mg Oral Q6H   insulin aspart  0-15 Units Subcutaneous TID WC   insulin aspart  3 Units Subcutaneous TID WC   insulin glargine  10 Units Subcutaneous QHS   isosorbide mononitrate  60 mg Oral Daily   levETIRAcetam  500 mg Oral Q24H   metoprolol tartrate  50 mg Oral BID   multivitamin with minerals  1 tablet Oral Daily   nystatin  1 application Topical BID   pantoprazole  40 mg Oral Daily   polyethylene glycol  17 g Oral Daily   senna-docusate  2 tablet Oral BID   vitamin B-12  250 mcg Oral Daily   Continuous  Infusions:  sodium chloride     sodium chloride     sodium chloride       LOS: 17 days    Time spent: 15 minutes    Sidney Ace, MD [facility] Pager [phone]  If 7PM-7AM, please contact night-coverage  01/02/2021, 10:00 AM

## 2021-01-03 DIAGNOSIS — N179 Acute kidney failure, unspecified: Secondary | ICD-10-CM | POA: Diagnosis not present

## 2021-01-03 DIAGNOSIS — N189 Chronic kidney disease, unspecified: Secondary | ICD-10-CM | POA: Diagnosis not present

## 2021-01-03 LAB — GLUCOSE, CAPILLARY
Glucose-Capillary: 272 mg/dL — ABNORMAL HIGH (ref 70–99)
Glucose-Capillary: 179 mg/dL — ABNORMAL HIGH (ref 70–99)
Glucose-Capillary: 226 mg/dL — ABNORMAL HIGH (ref 70–99)
Glucose-Capillary: 142 mg/dL — ABNORMAL HIGH (ref 70–99)
Glucose-Capillary: 190 mg/dL — ABNORMAL HIGH (ref 70–99)

## 2021-01-03 LAB — RENAL FUNCTION PANEL
Albumin: 2.6 g/dL — ABNORMAL LOW (ref 3.5–5.0)
Anion gap: 8 (ref 5–15)
BUN: 43 mg/dL — ABNORMAL HIGH (ref 8–23)
CO2: 27 mmol/L (ref 22–32)
Calcium: 8.4 mg/dL — ABNORMAL LOW (ref 8.9–10.3)
Chloride: 104 mmol/L (ref 98–111)
Creatinine, Ser: 2.87 mg/dL — ABNORMAL HIGH (ref 0.44–1.00)
GFR, Estimated: 18 mL/min — ABNORMAL LOW (ref >60)
Glucose, Bld: 205 mg/dL — ABNORMAL HIGH (ref 70–99)
Phosphorus: 4.2 mg/dL (ref 2.5–4.6)
Potassium: 4.2 mmol/L (ref 3.5–5.1)
Sodium: 139 mmol/L (ref 135–145)

## 2021-01-03 LAB — CBC WITH DIFFERENTIAL/PLATELET
Abs Immature Granulocytes: 0.03 10*3/uL (ref 0.00–0.07)
Basophils Absolute: 0.1 10*3/uL (ref 0.0–0.1)
Basophils Relative: 1 %
Eosinophils Absolute: 0.3 10*3/uL (ref 0.0–0.5)
Eosinophils Relative: 6 %
HCT: 24.5 % — ABNORMAL LOW (ref 36.0–46.0)
Hemoglobin: 8.1 g/dL — ABNORMAL LOW (ref 12.0–15.0)
Immature Granulocytes: 1 %
Lymphocytes Relative: 23 %
Lymphs Abs: 1.3 10*3/uL (ref 0.7–4.0)
MCH: 29.7 pg (ref 26.0–34.0)
MCHC: 33.1 g/dL (ref 30.0–36.0)
MCV: 89.7 fL (ref 80.0–100.0)
Monocytes Absolute: 0.6 10*3/uL (ref 0.1–1.0)
Monocytes Relative: 11 %
Neutro Abs: 3.2 10*3/uL (ref 1.7–7.7)
Neutrophils Relative %: 58 %
Platelets: 198 10*3/uL (ref 150–400)
RBC: 2.73 MIL/uL — ABNORMAL LOW (ref 3.87–5.11)
RDW: 14.3 % (ref 11.5–15.5)
WBC: 5.5 10*3/uL (ref 4.0–10.5)
nRBC: 0 % (ref 0.0–0.2)

## 2021-01-03 NOTE — Progress Notes (Signed)
PROGRESS NOTE    Susan House  VHQ:[id number] DOB: 02/14/1956 DOA: 12/14/2020 PCP: Jerrol Banana., MD   Brief Narrative:  65 y.o. female with medical history significant for history of CVA with residual left-sided weakness, left frontal encephalomalacia, CKD stage IIIb-IV, insulin-dependent type 2 diabetes, hypertension, hyperlipidemia, seizure disorder, and memory loss who presents to the ED for evaluation of generalized weakness and abnormal labs.   Patient recently admitted 11/28/2020-12/03/2020 for AKI on CKD managed with IV fluid hydration.  Renal ultrasound showed increased echogenicity compatible with medical renal disease and a 2.7 cm right kidney cyst.  She was discharged to SNF.   Patient states that she was sent to the ED due to abnormal labs.  Found to have AKI on CKD and evidence of urinary tract infection.   Kidney function appears to have peaked at 3.44.  Now slightly downtrending.  Mental status also slowly improving.  Mental status has been waxing and waning.  Discussed this with the patient's daughter.  I feel this is a progression of underlying vascular dementia and patient if may be established a new psychiatric baseline.   Unfortunately blood pressure has been very difficult to control.  Intravenous fluids were discontinued on 6/5 and patient had subsequent deterioration kidney function 6/6.  Nephrology following.   6/7-6/13: Renal function continues to get worse although still making urine so nephrology not ready for dialysis yet but not able to discharge either as her kidney function has not returned to baseline.  She required 1 PRBC transfusion on 6/12 for hemoglobin of 6.9 with appropriate response  6/14: Blood pressure improved control over interval though still not ideal.  Kidney function slowly improving.  Fluids restarted 6/13.  6/15: Kidney function is deteriorating despite gentle IV fluid resuscitation.  Case discussed with nephrology.  Lengthy  conversation with the husband and patient at bedside.  Likely that hemodialysis would be indicated.  Still questionable whether this is within the patient's and family's goals of care. 6/16: Had a lengthy conversation with the patient at bedside this morning.  I explained that hemodialysis was recommended given her deteriorating kidney function and lack of appreciable improvement.  After nephrology follow-up patient now agrees to have PermCath placed 6/17: PermCath placed and hemodialysis initiated today.  Patient seems to be tolerating well.    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 Renal function worsened today Similar to previous presentation likely etiology is ATN in the setting of poorly controlled hypertension as well as possible relative dehydration/weight depletion  Nephrology following.   Renal ultrasound with Doppler rule showed 10.8 x 5.6 cm cystic mass on the right side.  CT abdomen pelvis did not show this mass.  Showed some colitis but she is not having any symptoms of colitis so we will hold off treatment.  Also diverticulosis but no diverticulitis.  After persistent deterioration of kidney function decision made to start hemodialysis.  PermCath placed 6/17.  Hemodialysis to be initiated after HD: 6/17; 6/18 Plan: Nephrology follow-up for inpatient HD needs   Refractory hypertension  Continue current regimen of amlodipine, clonidine, hydralazine, metoprolol, and are Vasotec currently on hold Attempt to  titrate and de-escalate blood pressure regimen if BP allows Nephrology considering addition of high-dose loop diuretics   Urinary tract infection Completed 3 days of Rocephin.   Anemia of chronic kidney disease and B12 deficiency Hemoglobin 6.9 on 6/13 - s/p 1 PRBC for transfusion and Hb 8.8  Patient denies any obvious bleeding.  Continue B12 supplement and monitor for any signs/symptoms of bleeding.   Insulin-dependent type 2 diabetes A1c 8.6% Lantus 10 units nightly NovoLog 3 units 3 times daily with meals Moderate sliding scale Carb modified diet   History of CVA with residual left-sided weakness/left frontal encephalomalacia Continue aspirin, Plavix, atorvastatin.   Seizure disorder Continue Keppra Seizure precaution   Memory loss Suspected underlying vascular dementia.   She had some reported confusion/delirium prior to arrival.   Baseline level of mentation is unclear CT head significant for chronic volume loss and small vessel ischemic changes consistent with suspected vascular dementia Outpatient follow-up   Constipation Continue Senokot-S and MiraLAX   DVT prophylaxis: SQ heparin Code Status: DNR Family Communication: Daughter [name] [phone] on 6/16, 6/19 Disposition Plan: Status is: Inpatient  Remains inpatient appropriate because:Inpatient level of care appropriate due to severity of illness  Dispo: The patient is from: SNF              Anticipated d/c is to: SNF              Patient currently is not medically stable to d/c.   Difficult to place patient No  Restart hemodialysis during this admission.  Nephrology following for inpatient HD needs and renal recovery   Level of care: Med-Surg  Consultants:  Nephrology  Procedures:  None  Antimicrobials:  None   Subjective: Patient seen and examined.  Mentating more clearly this morning.  No apparent distress  Objective: Vitals:   01/03/21 0256 01/03/21 0301 01/03/21 0414 01/03/21 0809   BP: (!) 155/49 (!) 155/49 (!) 162/59 (!) 185/67  Pulse:  60 63 69  Resp:   18 18  Temp:  98.5 F (36.9 C) 98.9 F (37.2 C) (!) 97.4 F (36.3 C)  TempSrc:   Oral   SpO2:  97% 98% 100%  Weight:   93.1 kg   Height:        Intake/Output Summary (Last 24 hours) at 01/03/2021 1041 Last data filed at 01/03/2021 1021 Gross per 24 hour  Intake 840 ml  Output 300 ml  Net 540 ml   Filed Weights   01/01/21 0412 01/02/21 0553 01/03/21 0414  Weight: 93.2 kg 93 kg 93.1 kg    Examination:  General exam: No acute distress Respiratory system: Poor respiratory effort.  Lungs clear.  Room air Cardiovascular system: S1-S2, regular rate and rhythm, no murmurs, trace pedal edema, PermCath in place right chest  gastrointestinal system: Abdomen is nondistended, soft and nontender. No organomegaly or masses felt. Normal bowel sounds heard. Central nervous system: Alert, oriented x2, no focal deficits Extremities: Symmetric 5 x 5 power. Skin: No rashes, lesions or ulcers Psychiatry: Judgement and insight appear impaired. Mood & affect flattened.     Data Reviewed: I have personally reviewed following labs and imaging studies  CBC: Recent Labs  Lab 12/30/20 0540 12/31/20 0440 01/01/21 0405 01/02/21 0453 01/03/21 0623  WBC 6.3 5.5 7.0 5.5 5.5  NEUTROABS 3.5 3.2 4.4 2.8 3.2  HGB 9.0* 8.2* 8.8* 9.0* 8.1*  HCT 27.3* 24.6* 26.7* 27.8* 24.5*  MCV 88.3 89.1 89.6 87.4 89.7  PLT 255 250 247 256 417   Basic Metabolic Panel: Recent Labs  Lab 12/30/20 0540 12/31/20 0440 01/01/21 0405 01/02/21 0453 01/03/21 0623  NA 138 139 138 141 139  K 4.6 5.0 4.5 4.2 4.2  CL 110 111 109 109 104  CO2 22 20* 24 28 27   GLUCOSE 169* 217* 234* 187* 205*  BUN 66* 72* 49* 31* 43*  CREATININE 3.70* 3.95* 2.77* 1.95* 2.87*  CALCIUM 8.5* 8.5* 8.4* 8.5* 8.4*  PHOS  --   --   --   --  4.2   GFR: Estimated Creatinine Clearance: 21 mL/min (A) (by C-G formula based on SCr of 2.87 mg/dL (H)). Liver Function  Tests: Recent Labs  Lab 01/03/21 0623  ALBUMIN 2.6*   No results for input(s): LIPASE, AMYLASE in the last 168 hours. No results for input(s): AMMONIA in the last 168 hours. Coagulation Profile: No results for input(s): INR, PROTIME in the last 168 hours. Cardiac Enzymes: No results for input(s): CKTOTAL, CKMB, CKMBINDEX, TROPONINI in the last 168 hours. BNP (last 3 results) No results for input(s): PROBNP in the last 8760 hours. HbA1C: No results for input(s): HGBA1C in the last 72 hours. CBG: Recent Labs  Lab 01/02/21 0756 01/02/21 1118 01/02/21 1625 01/02/21 2109 01/03/21 0729  GLUCAP 188* 182* 154* 162* 179*   Lipid Profile: No results for input(s): CHOL, HDL, LDLCALC, TRIG, CHOLHDL, LDLDIRECT in the last 72 hours. Thyroid Function Tests: No results for input(s): TSH, T4TOTAL, FREET4, T3FREE, THYROIDAB in the last 72 hours. Anemia Panel: Recent Labs    01/02/21 0453  FERRITIN 40  TIBC 241*  IRON 46   Sepsis Labs: No results for input(s): PROCALCITON, LATICACIDVEN in the last 168 hours.  No results found for this or any previous visit (from the past 240 hour(s)).        Radiology Studies: No results found.      Scheduled Meds:  amLODipine  10 mg Oral Daily   aspirin  81 mg Oral Daily   atorvastatin  20 mg Oral QHS   Chlorhexidine Gluconate Cloth  6 each Topical Q0600   cloNIDine  0.3 mg Oral BID   clopidogrel  75 mg Oral Daily   gabapentin  100 mg Oral QHS   heparin  5,000 Units Subcutaneous Q8H   hydrALAZINE  100 mg Oral Q6H   insulin aspart  0-15 Units Subcutaneous TID WC   insulin aspart  3 Units Subcutaneous TID WC   insulin glargine  10 Units Subcutaneous QHS   isosorbide mononitrate  60 mg Oral Daily   levETIRAcetam  500 mg Oral Q24H   metoprolol tartrate  50 mg Oral BID   multivitamin with minerals  1 tablet Oral Daily   nystatin  1 application Topical BID   pantoprazole  40 mg Oral Daily   polyethylene glycol  17 g Oral Daily    senna-docusate  2 tablet Oral BID   vitamin B-12  250 mcg Oral Daily   Continuous Infusions:  sodium chloride     sodium chloride     iron sucrose Stopped (01/02/21 1416)   sodium chloride       LOS: 18 days    Time spent: 15 minutes    Sidney Ace, MD [facility] Pager [phone]  If 7PM-7AM, please contact night-coverage  01/03/2021, 10:41 AM

## 2021-01-03 NOTE — Progress Notes (Signed)
PT Cancellation Note  Patient Details Name: Susan House MRN: [id number] DOB: [birth year]   Cancelled Treatment:    Reason Eval/Treat Not Completed: Fatigue/lethargy limiting ability to participate. Patient sleeping soundly. Unable to rouse effectively for PT session. Will re-attempt at later date/time.     Seymour Pavlak 01/03/2021, 1:42 PM

## 2021-01-03 NOTE — Progress Notes (Signed)
   01/01/21 1135 01/01/21 1145 01/01/21 1200  Vitals  Temp 98.4 F (36.9 C)  --   --   Temp Source Oral  --   --   BP (!) 160/61 (!) 148/64 (!) 163/61  BP Location Right Arm  --   --   BP Method Automatic  --   --   Patient Position (if appropriate) Lying  --   --   Pulse Rate 66 67 67  Pulse Rate Source Monitor  --   --   Resp 17 17 17   During Hemodialysis Assessment  Blood Flow Rate (mL/min) 250 mL/min 250 mL/min 250 mL/min  Arterial Pressure (mmHg) -80 mmHg -80 mmHg -80 mmHg  Venous Pressure (mmHg) 50 mmHg 50 mmHg 50 mmHg  Transmembrane Pressure (mmHg) 60 mmHg 60 mmHg 60 mmHg  Ultrafiltration Rate (mL/min) 400 mL/min 400 mL/min 400 mL/min  Dialysate Flow Rate (mL/min) 600 ml/min 600 ml/min 600 ml/min  Conductivity: Machine  14.2 14 14   HD Safety Checks Performed Yes Yes Yes  Intra-Hemodialysis Comments Tx initiated Progressing as prescribed Progressing as prescribed    01/01/21 1215 01/01/21 1230 01/01/21 1245  Vitals  Temp  --   --   --   Temp Source  --   --   --   BP (!) 175/61 138/64 (!) 163/77  BP Location  --   --   --   BP Method  --   --   --   Patient Position (if appropriate)  --   --   --   Pulse Rate 68 67 68  Pulse Rate Source  --   --   --   Resp (!) 23 14 14   During Hemodialysis Assessment  Blood Flow Rate (mL/min) 250 mL/min 250 mL/min 250 mL/min  Arterial Pressure (mmHg) -80 mmHg -90 mmHg -90 mmHg  Venous Pressure (mmHg) 50 mmHg 50 mmHg 50 mmHg  Transmembrane Pressure (mmHg) 60 mmHg 60 mmHg 60 mmHg  Ultrafiltration Rate (mL/min) 400 mL/min 400 mL/min 400 mL/min  Dialysate Flow Rate (mL/min) 600 ml/min 600 ml/min 600 ml/min  Conductivity: Machine  14 13.9 14  HD Safety Checks Performed Yes Yes Yes  Intra-Hemodialysis Comments Progressing as prescribed Progressing as prescribed Progressing as prescribed    01/01/21 1300  Vitals  Temp  --   Temp Source  --   BP (!) 161/64  BP Location  --   BP Method  --   Patient Position (if appropriate)  --    Pulse Rate 68  Pulse Rate Source  --   Resp 17  During Hemodialysis Assessment  Blood Flow Rate (mL/min) 250 mL/min  Arterial Pressure (mmHg) -90 mmHg  Venous Pressure (mmHg) 50 mmHg  Transmembrane Pressure (mmHg) 60 mmHg  Ultrafiltration Rate (mL/min) 400 mL/min  Dialysate Flow Rate (mL/min) 600 ml/min  Conductivity: Machine  14  HD Safety Checks Performed Yes  Intra-Hemodialysis Comments Progressing as prescribed

## 2021-01-03 NOTE — Progress Notes (Signed)
[facility]  ROUNDING NOTE   Subjective:   Creatinine 2.87 (1.95) UOP 300 - recorded.    Objective:  Vital signs in last 24 hours:  Temp:  [97.4 F (36.3 C)-98.9 F (37.2 C)] 97.4 F (36.3 C) (06/20 0809) Pulse Rate:  [60-76] 69 (06/20 0809) Resp:  [18] 18 (06/20 0809) BP: (123-185)/(45-67) 185/67 (06/20 0809) SpO2:  [97 %-100 %] 100 % (06/20 0809) Weight:  [93.1 kg] 93.1 kg (06/20 0414)  Weight change: 0.1 kg Filed Weights   01/01/21 0412 01/02/21 0553 01/03/21 0414  Weight: 93.2 kg 93 kg 93.1 kg    Intake/Output: I/O last 3 completed shifts: In: 91 [P.O.:720] Out: 1450 [Urine:1450]   Intake/Output this shift:  Total I/O In: 360 [P.O.:360] Out: -   Physical Exam: General: Laying in bed  Head: Normocephalic, atraumatic.moist oral mucosal membranes  Eyes: Anicteric  Lungs:  Clear    Heart: regular  Abdomen:  Soft, nontender, non distended  Extremities:  trace peripheral edema.  Neurologic:  Alert to self and place.   Skin: No acute lesions or rashes       Basic Metabolic Panel: Recent Labs  Lab 12/30/20 0540 12/31/20 0440 01/01/21 0405 01/02/21 0453 01/03/21 0623  NA 138 139 138 141 139  K 4.6 5.0 4.5 4.2 4.2  CL 110 111 109 109 104  CO2 22 20* 24 28 27   GLUCOSE 169* 217* 234* 187* 205*  BUN 66* 72* 49* 31* 43*  CREATININE 3.70* 3.95* 2.77* 1.95* 2.87*  CALCIUM 8.5* 8.5* 8.4* 8.5* 8.4*  PHOS  --   --   --   --  4.2     Liver Function Tests: Recent Labs  Lab 01/03/21 0623  ALBUMIN 2.6*   No results for input(s): LIPASE, AMYLASE in the last 168 hours. No results for input(s): AMMONIA in the last 168 hours.  CBC: Recent Labs  Lab 12/30/20 0540 12/31/20 0440 01/01/21 0405 01/02/21 0453 01/03/21 0623  WBC 6.3 5.5 7.0 5.5 5.5  NEUTROABS 3.5 3.2 4.4 2.8 3.2  HGB 9.0* 8.2* 8.8* 9.0* 8.1*  HCT 27.3* 24.6* 26.7* 27.8* 24.5*  MCV 88.3 89.1 89.6 87.4 89.7  PLT 255 250 247 256 198     Cardiac Enzymes: No results for  input(s): CKTOTAL, CKMB, CKMBINDEX, TROPONINI in the last 168 hours.  BNP: Invalid input(s): POCBNP  CBG: Recent Labs  Lab 01/02/21 0756 01/02/21 1118 01/02/21 1625 01/02/21 2109 01/03/21 0729  GLUCAP 188* 182* 154* 162* 179*     Microbiology: Results for orders placed or performed during the hospital encounter of 12/14/20  SARS CORONAVIRUS 2 (TAT 6-24 HRS) Nasopharyngeal Nasopharyngeal Swab     Status: None   Collection Time: 12/14/20  8:28 PM   Specimen: Nasopharyngeal Swab  Result Value Ref Range Status   SARS Coronavirus 2 NEGATIVE NEGATIVE Final    Comment: (NOTE) SARS-CoV-2 target nucleic acids are NOT DETECTED.  The SARS-CoV-2 RNA is generally detectable in upper and lower respiratory specimens during the acute phase of infection. Negative results do not preclude SARS-CoV-2 infection, do not rule out co-infections with other pathogens, and should not be used as the sole basis for treatment or other patient management decisions. Negative results must be combined with clinical observations, patient history, and epidemiological information. The expected result is Negative.  Fact Sheet for Patients: SugarRoll.be  Fact Sheet for Healthcare Providers: https://www.woods-mathews.com/  This test is not yet approved or cleared by the [place] FDA and  has been authorized for detection  and/or diagnosis of SARS-CoV-2 by FDA under an Emergency Use Authorization (EUA). This EUA will remain  in effect (meaning this test can be used) for the duration of the COVID-19 declaration under Se ction 564(b)(1) of the Act, 21 U.S.C. section 360bbb-3(b)(1), unless the authorization is terminated or revoked sooner.  Performed at [facility], [place] [address], [place], [place] [address]   SARS CORONAVIRUS 2 (TAT 6-24 HRS) Nasopharyngeal Nasopharyngeal Swab     Status: None   Collection Time: 12/19/20  4:40 PM   Specimen:  Nasopharyngeal Swab  Result Value Ref Range Status   SARS Coronavirus 2 NEGATIVE NEGATIVE Final    Comment: (NOTE) SARS-CoV-2 target nucleic acids are NOT DETECTED.  The SARS-CoV-2 RNA is generally detectable in upper and lower respiratory specimens during the acute phase of infection. Negative results do not preclude SARS-CoV-2 infection, do not rule out co-infections with other pathogens, and should not be used as the sole basis for treatment or other patient management decisions. Negative results must be combined with clinical observations, patient history, and epidemiological information. The expected result is Negative.  Fact Sheet for Patients: SugarRoll.be  Fact Sheet for Healthcare Providers: https://www.woods-mathews.com/  This test is not yet approved or cleared by the [place] FDA and  has been authorized for detection and/or diagnosis of SARS-CoV-2 by FDA under an Emergency Use Authorization (EUA). This EUA will remain  in effect (meaning this test can be used) for the duration of the COVID-19 declaration under Se ction 564(b)(1) of the Act, 21 U.S.C. section 360bbb-3(b)(1), unless the authorization is terminated or revoked sooner.  Performed at [facility], [place] [address], [place], [place] [address]     Coagulation Studies: No results for input(s): LABPROT, INR in the last 72 hours.  Urinalysis: No results for input(s): COLORURINE, LABSPEC, PHURINE, GLUCOSEU, HGBUR, BILIRUBINUR, KETONESUR, PROTEINUR, UROBILINOGEN, NITRITE, LEUKOCYTESUR in the last 72 hours.  Invalid input(s): APPERANCEUR    Imaging: No results found.   Medications:    sodium chloride     sodium chloride     iron sucrose Stopped (01/02/21 1416)   sodium chloride      amLODipine  10 mg Oral Daily   aspirin  81 mg Oral Daily   atorvastatin  20 mg Oral QHS   Chlorhexidine Gluconate Cloth  6 each Topical Q0600   cloNIDine  0.3 mg  Oral BID   clopidogrel  75 mg Oral Daily   gabapentin  100 mg Oral QHS   heparin  5,000 Units Subcutaneous Q8H   hydrALAZINE  100 mg Oral Q6H   insulin aspart  0-15 Units Subcutaneous TID WC   insulin aspart  3 Units Subcutaneous TID WC   insulin glargine  10 Units Subcutaneous QHS   isosorbide mononitrate  60 mg Oral Daily   levETIRAcetam  500 mg Oral Q24H   metoprolol tartrate  50 mg Oral BID   multivitamin with minerals  1 tablet Oral Daily   nystatin  1 application Topical BID   pantoprazole  40 mg Oral Daily   polyethylene glycol  17 g Oral Daily   senna-docusate  2 tablet Oral BID   vitamin B-12  250 mcg Oral Daily   sodium chloride, sodium chloride, acetaminophen **OR** acetaminophen, alteplase, heparin, HYDROcodone-acetaminophen, hydrocortisone, iohexol, lidocaine (PF), lidocaine-prilocaine, ondansetron **OR** ondansetron (ZOFRAN) IV, pentafluoroprop-tetrafluoroeth, senna-docusate  Assessment/ Plan:  [name] is a 65 y.o. white female with diabetes mellitus type II, hypertension, CVA with residual left  hemiplegia, hyperlipidemia, congestive heart failure who was admitted to [facility] on 12/14/2020 for AKI (acute kidney injury) ([place]) [N17.9] Altered mental status, unspecified altered mental status type [R41.82] Acute kidney injury superimposed on chronic kidney disease ([place]) [R75.4, H60.6]  Complicated and prolonged hospitalization. Initiated on hemodialysis yesterday.   Acute Kidney Injury on chronic kidney disease stage IV with baseline creatinine 2.32 and GFR  of 23 on 12/03/20. Nephrotic range proteinuria.  Acute kidney injury secondary to ATN, prerenal azotemia and uncontrolled hypertension Chronic kidney disease is secondary to diabetic nephropathy.  Holding enalapril Nonoliguric urine output.  - monitor for dialysis need and renal recovery.  - Will consider high dose loop diuretics  Lab Results  Component Value Date   CREATININE 2.87 (H) 01/03/2021    CREATININE 1.95 (H) 01/02/2021   CREATININE 2.77 (H) 01/01/2021    Intake/Output Summary (Last 24 hours) at 01/03/2021 1033 Last data filed at 01/03/2021 1021 Gross per 24 hour  Intake 840 ml  Output 300 ml  Net 540 ml     2. Hypertension: 185/67 . Volume status at goal.   Current regimen of amlodipine, clonidine, hydralazine, and metoprolol.  3. 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  Hgb A1c 8.6% on 11/29/20  4. Anemia of CKD with iron deficiency.  Lab Results  Component Value Date   HGB 8.1 (L) 01/03/2021  - started on IV iron   LOS: [place] 6/20/202210:33 AM

## 2021-01-03 NOTE — Progress Notes (Signed)
   01/01/21 1315 01/01/21 1330 01/01/21 1345  Vitals  Temp  --   --   --   Temp Source  --   --   --   BP (!) 160/70 (!) 162/76 (!) 167/74  BP Location  --   --   --   BP Method  --   --   --   Patient Position (if appropriate)  --   --   --   Pulse Rate 72 73 74  Pulse Rate Source  --   --   --   Resp 17 20 20   During Hemodialysis Assessment  Blood Flow Rate (mL/min) 250 mL/min 250 mL/min 250 mL/min  Arterial Pressure (mmHg) -90 mmHg -90 mmHg -90 mmHg  Venous Pressure (mmHg) 50 mmHg 50 mmHg 50 mmHg  Transmembrane Pressure (mmHg) 60 mmHg 60 mmHg 60 mmHg  Ultrafiltration Rate (mL/min) 400 mL/min 400 mL/min 400 mL/min  Dialysate Flow Rate (mL/min) 600 ml/min 600 ml/min 600 ml/min  Conductivity: Machine  14 14 14   HD Safety Checks Performed Yes Yes Yes  Dialysis Fluid Bolus  --   --   --   Bolus Amount (mL)  --   --   --   Intra-Hemodialysis Comments Progressing as prescribed Progressing as prescribed Progressing as prescribed    01/01/21 1400 01/01/21 1405  Vitals  Temp  --  98 F (36.7 C)  Temp Source  --  Oral  BP (!) 167/59 (!) 173/72  BP Location  --  Right Arm  BP Method  --  Automatic  Patient Position (if appropriate)  --  Lying  Pulse Rate 71 72  Pulse Rate Source  --  Dinamap  Resp 19 19  During Hemodialysis Assessment  Blood Flow Rate (mL/min) 250 mL/min  --   Arterial Pressure (mmHg) -90 mmHg  --   Venous Pressure (mmHg) 50 mmHg  --   Transmembrane Pressure (mmHg) 60 mmHg  --   Ultrafiltration Rate (mL/min) 400 mL/min  --   Dialysate Flow Rate (mL/min) 600 ml/min  --   Conductivity: Machine  14  --   HD Safety Checks Performed Yes  --   Dialysis Fluid Bolus  --  Normal Saline  Bolus Amount (mL)  --  300 mL  Intra-Hemodialysis Comments Progressing as prescribed Tx completed

## 2021-01-04 LAB — CBC WITH DIFFERENTIAL/PLATELET
Abs Immature Granulocytes: 0.03 10*3/uL (ref 0.00–0.07)
Basophils Absolute: 0 10*3/uL (ref 0.0–0.1)
Basophils Relative: 1 %
Eosinophils Absolute: 0.4 10*3/uL (ref 0.0–0.5)
Eosinophils Relative: 7 %
HCT: 24.4 % — ABNORMAL LOW (ref 36.0–46.0)
Hemoglobin: 8.1 g/dL — ABNORMAL LOW (ref 12.0–15.0)
Immature Granulocytes: 1 %
Lymphocytes Relative: 20 %
Lymphs Abs: 1.2 10*3/uL (ref 0.7–4.0)
MCH: 29.9 pg (ref 26.0–34.0)
MCHC: 33.2 g/dL (ref 30.0–36.0)
MCV: 90 fL (ref 80.0–100.0)
Monocytes Absolute: 0.6 10*3/uL (ref 0.1–1.0)
Monocytes Relative: 9 %
Neutro Abs: 3.9 10*3/uL (ref 1.7–7.7)
Neutrophils Relative %: 62 %
Platelets: 198 10*3/uL (ref 150–400)
RBC: 2.71 MIL/uL — ABNORMAL LOW (ref 3.87–5.11)
RDW: 14.1 % (ref 11.5–15.5)
WBC: 6.2 10*3/uL (ref 4.0–10.5)
nRBC: 0 % (ref 0.0–0.2)

## 2021-01-04 LAB — RENAL FUNCTION PANEL
Albumin: 2.5 g/dL — ABNORMAL LOW (ref 3.5–5.0)
Anion gap: 7 (ref 5–15)
BUN: 50 mg/dL — ABNORMAL HIGH (ref 8–23)
CO2: 26 mmol/L (ref 22–32)
Calcium: 8.6 mg/dL — ABNORMAL LOW (ref 8.9–10.3)
Chloride: 104 mmol/L (ref 98–111)
Creatinine, Ser: 2.92 mg/dL — ABNORMAL HIGH (ref 0.44–1.00)
GFR, Estimated: 17 mL/min — ABNORMAL LOW (ref >60)
Glucose, Bld: 237 mg/dL — ABNORMAL HIGH (ref 70–99)
Phosphorus: 5 mg/dL — ABNORMAL HIGH (ref 2.5–4.6)
Potassium: 4.2 mmol/L (ref 3.5–5.1)
Sodium: 137 mmol/L (ref 135–145)

## 2021-01-04 LAB — GLUCOSE, CAPILLARY
Glucose-Capillary: 218 mg/dL — ABNORMAL HIGH (ref 70–99)
Glucose-Capillary: 271 mg/dL — ABNORMAL HIGH (ref 70–99)
Glucose-Capillary: 155 mg/dL — ABNORMAL HIGH (ref 70–99)

## 2021-01-04 MED ORDER — EPOETIN ALFA 10000 UNIT/ML IJ SOLN
10000.0000 [IU] | INTRAMUSCULAR | Status: DC
  Administered 2021-01-04: 10000 [IU] via INTRAVENOUS

## 2021-01-04 MED ORDER — RENA-VITE PO TABS
1.0000 | ORAL_TABLET | Freq: Every day | ORAL | Status: DC
  Administered 2021-01-04 – 2021-01-18 (×14): 1 via ORAL
  Filled 2021-01-04 (×15): qty 1

## 2021-01-04 MED ORDER — LEVETIRACETAM 250 MG PO TABS
250.0000 mg | ORAL_TABLET | Freq: Once | ORAL | Status: AC
  Administered 2021-01-04: 250 mg via ORAL
  Filled 2021-01-04: qty 1

## 2021-01-04 MED ORDER — NEPRO/CARBSTEADY PO LIQD
237.0000 mL | Freq: Two times a day (BID) | ORAL | Status: DC
  Administered 2021-01-04 – 2021-01-17 (×19): 237 mL via ORAL

## 2021-01-04 MED ORDER — TORSEMIDE 20 MG PO TABS
20.0000 mg | ORAL_TABLET | Freq: Every day | ORAL | Status: DC
  Administered 2021-01-04 – 2021-01-19 (×14): 20 mg via ORAL
  Filled 2021-01-04 (×16): qty 1

## 2021-01-04 NOTE — Progress Notes (Signed)
Spoke with T[place]ka in central telemetry at 1000 to put patient in room 206-2 from room 203.

## 2021-01-04 NOTE — Progress Notes (Signed)
PROGRESS NOTE    Susan House  KPV:[id number] DOB: 10-Nov-1955 DOA: 12/14/2020 PCP: Jerrol Banana., MD   Brief Narrative:  65 y.o. female with medical history significant for history of CVA with residual left-sided weakness, left frontal encephalomalacia, CKD stage IIIb-IV, insulin-dependent type 2 diabetes, hypertension, hyperlipidemia, seizure disorder, and memory loss who presents to the ED for evaluation of generalized weakness and abnormal labs.   Patient recently admitted 11/28/2020-12/03/2020 for AKI on CKD managed with IV fluid hydration.  Renal ultrasound showed increased echogenicity compatible with medical renal disease and a 2.7 cm right kidney cyst.  She was discharged to SNF.   Patient states that she was sent to the ED due to abnormal labs.  Found to have AKI on CKD and evidence of urinary tract infection.   Kidney function appears to have peaked at 3.44.  Now slightly downtrending.  Mental status also slowly improving.  Mental status has been waxing and waning.  Discussed this with the patient's daughter.  I feel this is a progression of underlying vascular dementia and patient if may be established a new psychiatric baseline.   Unfortunately blood pressure has been very difficult to control.  Intravenous fluids were discontinued on 6/5 and patient had subsequent deterioration kidney function 6/6.  Nephrology following.   6/7-6/13: Renal function continues to get worse although still making urine so nephrology not ready for dialysis yet but not able to discharge either as her kidney function has not returned to baseline.  She required 1 PRBC transfusion on 6/12 for hemoglobin of 6.9 with appropriate response  6/14: Blood pressure improved control over interval though still not ideal.  Kidney function slowly improving.  Fluids restarted 6/13.  6/15: Kidney function is deteriorating despite gentle IV fluid resuscitation.  Case discussed with nephrology.  Lengthy  conversation with the husband and patient at bedside.  Likely that hemodialysis would be indicated.  Still questionable whether this is within the patient's and family's goals of care. 6/16: Had a lengthy conversation with the patient at bedside this morning.  I explained that hemodialysis was recommended given her deteriorating kidney function and lack of appreciable improvement.  After nephrology follow-up patient now agrees to have PermCath placed 6/17: PermCath placed and hemodialysis initiated today.  Patient seems to be tolerating well. 6/20: Nephrology following for inpatient hemodialysis needs.  Patient is making urine and demonstrating some renal recovery.  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 Renal function worsened today Similar to previous presentation likely etiology is ATN in the setting of poorly controlled hypertension as well as possible relative dehydration/weight depletion  Nephrology following.   Renal ultrasound with Doppler rule showed 10.8 x 5.6 cm cystic mass on the right side.  CT abdomen pelvis did not show this mass.  Showed some colitis but she is not having any symptoms of colitis so we will hold off treatment.  Also diverticulosis but no diverticulitis.  After persistent deterioration of kidney function decision  made to start hemodialysis.  PermCath placed 6/17.  Hemodialysis to be initiated after HD: 6/17; 6/18;  6/21 Plan: Nephrology following for inpatient hemodialysis needs Plan for HD today   Refractory hypertension  Continue current regimen of amlodipine, clonidine, hydralazine, metoprolol, and are Vasotec currently on hold Attempt to titrate and de-escalate blood pressure regimen if BP allows Nephrology considering addition of high-dose loop diuretics   Urinary tract infection Completed 3 days of Rocephin.   Anemia of chronic kidney disease and B12 deficiency Hemoglobin 6.9 on 6/13 - s/p 1 PRBC for transfusion and Hb 8.8  Patient denies any obvious bleeding.  Continue B12 supplement and monitor for any signs/symptoms of bleeding.   Insulin-dependent type 2 diabetes A1c 8.6% Lantus 10 units nightly NovoLog 3 units 3 times daily with meals Moderate sliding scale Carb modified diet   History of CVA with residual left-sided weakness/left frontal encephalomalacia Continue aspirin, Plavix, atorvastatin.   Seizure disorder Continue Keppra Seizure precaution   Memory loss Suspected underlying vascular dementia.   She had some reported confusion/delirium prior to arrival.   Baseline level of mentation is unclear CT head significant for chronic volume loss and small vessel ischemic changes consistent with suspected vascular dementia Outpatient follow-up   Constipation Continue Senokot-S and MiraLAX   DVT prophylaxis: SQ heparin Code Status: DNR Family Communication: Daughter [name] [phone] on 6/16, 6/19, left VM on 6/21 Disposition Plan: Status is: Inpatient  Remains inpatient appropriate because:Inpatient level of care appropriate due to severity of illness  Dispo: The patient is from: SNF              Anticipated d/c is to: SNF              Patient currently is not medically stable to d/c.   Difficult to place patient No  New start hemodialysis during this admission.  Nephrology following for inpatient hemodialysis needs.  Disposition plan pending.   Level of care:  Med-Surg  Consultants:  Nephrology  Procedures:  None  Antimicrobials:  None   Subjective: Patient seen and examined.  Stable this morning but seems emotional.  No apparent distress  Objective: Vitals:   01/04/21 1030 01/04/21 1045 01/04/21 1100 01/04/21 1115  BP: (!) 160/61 (!) 174/64 (!) 166/61 (!) 189/61  Pulse: 65 68 68 68  Resp: 16 12 12 12   Temp:      TempSrc:      SpO2:      Weight:      Height:        Intake/Output Summary (Last 24 hours) at 01/04/2021 1138 Last data filed at 01/04/2021 0900 Gross per 24 hour  Intake 480 ml  Output 1200 ml  Net -720 ml   Filed Weights   01/01/21 0412 01/02/21 0553 01/03/21 0414  Weight: 93.2 kg 93 kg 93.1 kg    Examination:  General exam: No acute distress Respiratory system: Poor respiratory effort.  Lungs clear.  Room air Cardiovascular system: S1-S2, regular rate and rhythm, no murmurs, trace pedal edema, PermCath in place right chest  gastrointestinal system: Abdomen is nondistended, soft and nontender. No organomegaly or masses felt. Normal bowel sounds heard. Central nervous system: Alert, oriented x2, no focal deficits Extremities: Symmetric 5 x 5 power. Skin: No rashes, lesions or ulcers Psychiatry: Judgement and insight appear impaired. Mood & affect flattened.     Data Reviewed: I have personally reviewed following labs and imaging studies  CBC: Recent Labs  Lab 12/31/20 0440 01/01/21 0405 01/02/21 0453 01/03/21 0981  01/04/21 0421  WBC 5.5 7.0 5.5 5.5 6.2  NEUTROABS 3.2 4.4 2.8 3.2 3.9  HGB 8.2* 8.8* 9.0* 8.1* 8.1*  HCT 24.6* 26.7* 27.8* 24.5* 24.4*  MCV 89.1 89.6 87.4 89.7 90.0  PLT 250 247 256 198 193   Basic Metabolic Panel: Recent Labs  Lab 12/31/20 0440 01/01/21 0405 01/02/21 0453 01/03/21 0623 01/04/21 0421  NA 139 138 141 139 137  K 5.0 4.5 4.2 4.2 4.2  CL 111 109 109 104 104  CO2 20* 24 28 27 26   GLUCOSE 217* 234* 187* 205* 237*  BUN 72* 49* 31* 43* 50*  CREATININE 3.95* 2.77*  1.95* 2.87* 2.92*  CALCIUM 8.5* 8.4* 8.5* 8.4* 8.6*  PHOS  --   --   --  4.2 5.0*   GFR: Estimated Creatinine Clearance: 20.7 mL/min (A) (by C-G formula based on SCr of 2.92 mg/dL (H)). Liver Function Tests: Recent Labs  Lab 01/03/21 0623 01/04/21 0421  ALBUMIN 2.6* 2.5*   No results for input(s): LIPASE, AMYLASE in the last 168 hours. No results for input(s): AMMONIA in the last 168 hours. Coagulation Profile: No results for input(s): INR, PROTIME in the last 168 hours. Cardiac Enzymes: No results for input(s): CKTOTAL, CKMB, CKMBINDEX, TROPONINI in the last 168 hours. BNP (last 3 results) No results for input(s): PROBNP in the last 8760 hours. HbA1C: No results for input(s): HGBA1C in the last 72 hours. CBG: Recent Labs  Lab 01/03/21 0729 01/03/21 1132 01/03/21 1624 01/03/21 2106 01/04/21 0745  GLUCAP 179* 226* 142* 190* 218*   Lipid Profile: No results for input(s): CHOL, HDL, LDLCALC, TRIG, CHOLHDL, LDLDIRECT in the last 72 hours. Thyroid Function Tests: No results for input(s): TSH, T4TOTAL, FREET4, T3FREE, THYROIDAB in the last 72 hours. Anemia Panel: Recent Labs    01/02/21 0453  FERRITIN 40  TIBC 241*  IRON 46   Sepsis Labs: No results for input(s): PROCALCITON, LATICACIDVEN in the last 168 hours.  No results found for this or any previous visit (from the past 240 hour(s)).        Radiology Studies: No results found.      Scheduled Meds:  amLODipine  10 mg Oral Daily   aspirin  81 mg Oral Daily   atorvastatin  20 mg Oral QHS   Chlorhexidine Gluconate Cloth  6 each Topical Q0600   cloNIDine  0.3 mg Oral BID   clopidogrel  75 mg Oral Daily   epoetin (EPOGEN/PROCRIT) injection  10,000 Units Intravenous Q T,Th,Sa-HD   gabapentin  100 mg Oral QHS   heparin  5,000 Units Subcutaneous Q8H   hydrALAZINE  100 mg Oral Q6H   insulin aspart  0-15 Units Subcutaneous TID WC   insulin aspart  3 Units Subcutaneous TID WC   insulin glargine  10 Units  Subcutaneous QHS   isosorbide mononitrate  60 mg Oral Daily   levETIRAcetam  250 mg Oral Once   levETIRAcetam  500 mg Oral Q24H   metoprolol tartrate  50 mg Oral BID   multivitamin with minerals  1 tablet Oral Daily   nystatin  1 application Topical BID   pantoprazole  40 mg Oral Daily   polyethylene glycol  17 g Oral Daily   senna-docusate  2 tablet Oral BID   torsemide  20 mg Oral Daily   vitamin B-12  250 mcg Oral Daily   Continuous Infusions:  sodium chloride     sodium chloride     iron sucrose Stopped (01/03/21 1200)  sodium chloride       LOS: 19 days    Time spent: 15 minutes    Sidney Ace, MD [facility] Pager [phone]  If 7PM-7AM, please contact night-coverage  01/04/2021, 11:38 AM

## 2021-01-04 NOTE — Progress Notes (Signed)
OT Cancellation Note  Patient Details Name: Susan House MRN: [id number] DOB: 02/24/1956   Cancelled Treatment:    Reason Eval/Treat Not Completed: Patient at procedure or test/ unavailable. Pt currently out of the room for dialysis. OT to follow up when pt is next available for OT intervention.   Darleen Crocker, MS, OTR/L , CBIS ascom [phone]  01/04/21, 1:01 PM

## 2021-01-04 NOTE — TOC Progression Note (Addendum)
Transition of Care [facility]) - Progression Note    Patient Details  Name: Susan House MRN: [id number] Date of Birth: 1955-10-09  Transition of [facility]) CM/SW [place], LCSW Phone Number: 01/04/2021, 9:14 AM  Clinical Narrative:  Per MD, patient has been talking about wanting to go home at discharge rather than return to SNF. Left voicemail for daughter. Will discuss with her when she calls back.   12:49 pm: Received call back from daughter. She feels like they can bring her home if patient can at least stand. They already have a hospital bed, wheelchair, and hoyer lift at home. Notified team. Will continue to follow progress.  Expected Discharge Plan: [place] Barriers to Discharge: Continued Medical Work up  Expected Discharge Plan and Services Expected Discharge Plan: [place] Acute Care Choice: Resumption of Svcs/PTA Provider Living arrangements for the past 2 months: Single Family Home                                       Social Determinants of Health (SDOH) Interventions    Readmission Risk Interventions No flowsheet data found.

## 2021-01-04 NOTE — Progress Notes (Signed)
Nutrition Follow-up  DOCUMENTATION CODES:   Severe malnutrition in context of acute illness/injury, Obesity unspecified  INTERVENTION:   Add Nepro Shake po BID, each supplement provides 425 kcal and 19 grams protein  Continue Magic cup TID with meals, each supplement provides 290 kcal and 9 grams of protein  Add Rena-vit po daily   NUTRITION DIAGNOSIS:   Severe Malnutrition related to acute illness, chronic illness, poor appetite as evidenced by energy intake < or equal to 50% for > or equal to 5 days, percent weight loss.  GOAL:   Patient will meet greater than or equal to 90% of their needs -progressing   MONITOR:   PO intake, Supplement acceptance, Labs, Weight trends, I & O's  ASSESSMENT:   65 y.o. female with medical history significant for history of CVA with residual left-sided weakness, left frontal encephalomalacia, CKD stage IIIb-IV, insulin-dependent type 2 diabetes, hypertension, hyperlipidemia, seizure disorder and memory loss (vascular dementia) who is admitted with AKI and progression to ESRD now s/p perm cath and HD initiation 6/17  Pt continues to have good appetite and oral intake; pt eating 50-100% of meals in hospital. Pt is receiving Magic Cups on meals trays. RD will add Nepro and rena-vit as pt is now receiving HD. Per chart, pt has remained weight stable since admit but is currently down ~26lbs from her UBW. Plan is for HD today.   Medications reviewed and include: aspirin, plavix, epogen, heparin, insulin, protonix, miralax, senokot, torsemide B12, iron sucrose   Labs reviewed: K 4.2 wnl, BUN 50(H), creat 2.92(H), P 5.0(H) B12- 149(L)- 5/18 Hgb 8.1(L), Hct 24.4(L) cbgs- 179, 226, 142, 190, 218 x 24 hrs  UOP- 1268ml   Diet Order:   Diet Order             Diet renal with fluid restriction Fluid restriction: 1200 mL Fluid; Room service appropriate? Yes; Fluid consistency: Thin  Diet effective now                  EDUCATION NEEDS:    Education needs have been addressed  Skin:  Skin Assessment: Reviewed RN Assessment  Last BM:  6/19- type 6  Height:   Ht Readings from Last 1 Encounters:  12/24/20 5\' 2"  (1.575 m)    Weight:   Wt Readings from Last 1 Encounters:  01/03/21 93.1 kg    Ideal Body Weight:  50 kg  BMI:  Body mass index is 37.54 kg/m.  Estimated Nutritional Needs:   Kcal:  1900-2200kcal/day  Protein:  95-110g/day  Fluid:  UOP +1L  Koleen Distance MS, RD, LDN Please refer to [facility] for RD and/or RD on-call/weekend/after hours pager

## 2021-01-04 NOTE — Progress Notes (Signed)
New start hemodialysis patient needing placement at outpatient clinic. Spoke with patient today, she requested that the plans be made with daughter, called left VM, will follow up. Please contact me with any dialysis placement concerns.  Elvera Bicker Dialysis Coordinator [phone]

## 2021-01-04 NOTE — Progress Notes (Signed)
PT Cancellation Note  Patient Details Name: Susan House MRN: [id number] DOB: 07-21-1955   Cancelled Treatment:    Reason Eval/Treat Not Completed: Patient at procedure or test/unavailable.  Pt currently out of the room for dialysis.  PT to follow up when pt is next available for PT intervention.    Gwenlyn Saran, PT, DPT 01/04/21, 1:12 PM

## 2021-01-04 NOTE — Progress Notes (Signed)
[facility]  ROUNDING NOTE   Subjective:   Creatinine 2.92 (2.87) (1.95) UOP 1200   Objective:  Vital signs in last 24 hours:  Temp:  [98.2 F (36.8 C)-98.4 F (36.9 C)] 98.2 F (36.8 C) (06/21 0820) Pulse Rate:  [62-68] 64 (06/21 0820) Resp:  [16-18] 18 (06/21 0820) BP: (157-190)/(51-71) 185/71 (06/21 0820) SpO2:  [97 %-99 %] 98 % (06/21 0820)  Weight change:  Filed Weights   01/01/21 0412 01/02/21 0553 01/03/21 0414  Weight: 93.2 kg 93 kg 93.1 kg    Intake/Output: I/O last 3 completed shifts: In: 600 [P.O.:600] Out: 1500 [Urine:1500]   Intake/Output this shift:  No intake/output data recorded.  Physical Exam: General: Laying in bed  Head: Normocephalic, atraumatic.moist oral mucosal membranes  Eyes: Anicteric  Lungs:  Clear    Heart: regular  Abdomen:  Soft, nontender, non distended  Extremities:  trace peripheral edema.  Neurologic:  Alert to self and place.   Skin: No acute lesions or rashes       Basic Metabolic Panel: Recent Labs  Lab 12/31/20 0440 01/01/21 0405 01/02/21 0453 01/03/21 0623 01/04/21 0421  NA 139 138 141 139 137  K 5.0 4.5 4.2 4.2 4.2  CL 111 109 109 104 104  CO2 20* 24 28 27 26   GLUCOSE 217* 234* 187* 205* 237*  BUN 72* 49* 31* 43* 50*  CREATININE 3.95* 2.77* 1.95* 2.87* 2.92*  CALCIUM 8.5* 8.4* 8.5* 8.4* 8.6*  PHOS  --   --   --  4.2 5.0*     Liver Function Tests: Recent Labs  Lab 01/03/21 0623 01/04/21 0421  ALBUMIN 2.6* 2.5*    No results for input(s): LIPASE, AMYLASE in the last 168 hours. No results for input(s): AMMONIA in the last 168 hours.  CBC: Recent Labs  Lab 12/31/20 0440 01/01/21 0405 01/02/21 0453 01/03/21 0623 01/04/21 0421  WBC 5.5 7.0 5.5 5.5 6.2  NEUTROABS 3.2 4.4 2.8 3.2 3.9  HGB 8.2* 8.8* 9.0* 8.1* 8.1*  HCT 24.6* 26.7* 27.8* 24.5* 24.4*  MCV 89.1 89.6 87.4 89.7 90.0  PLT 250 247 256 198 198     Cardiac Enzymes: No results for input(s): CKTOTAL, CKMB, CKMBINDEX,  TROPONINI in the last 168 hours.  BNP: Invalid input(s): POCBNP  CBG: Recent Labs  Lab 01/03/21 0729 01/03/21 1132 01/03/21 1624 01/03/21 2106 01/04/21 0745  GLUCAP 179* 226* 142* 190* 218*     Microbiology: Results for orders placed or performed during the hospital encounter of 12/14/20  SARS CORONAVIRUS 2 (TAT 6-24 HRS) Nasopharyngeal Nasopharyngeal Swab     Status: None   Collection Time: 12/14/20  8:28 PM   Specimen: Nasopharyngeal Swab  Result Value Ref Range Status   SARS Coronavirus 2 NEGATIVE NEGATIVE Final    Comment: (NOTE) SARS-CoV-2 target nucleic acids are NOT DETECTED.  The SARS-CoV-2 RNA is generally detectable in upper and lower respiratory specimens during the acute phase of infection. Negative results do not preclude SARS-CoV-2 infection, do not rule out co-infections with other pathogens, and should not be used as the sole basis for treatment or other patient management decisions. Negative results must be combined with clinical observations, patient history, and epidemiological information. The expected result is Negative.  Fact Sheet for Patients: SugarRoll.be  Fact Sheet for Healthcare Providers: https://www.woods-mathews.com/  This test is not yet approved or cleared by the [place] FDA and  has been authorized for detection and/or diagnosis of SARS-CoV-2 by FDA under an Emergency Use Authorization (EUA). This  EUA will remain  in effect (meaning this test can be used) for the duration of the COVID-19 declaration under Se ction 564(b)(1) of the Act, 21 U.S.C. section 360bbb-3(b)(1), unless the authorization is terminated or revoked sooner.  Performed at [facility], [place] [address] [place], [place] [address]   SARS CORONAVIRUS 2 (TAT 6-24 HRS) Nasopharyngeal Nasopharyngeal Swab     Status: None   Collection Time: 12/19/20  4:40 PM   Specimen: Nasopharyngeal Swab  Result Value Ref Range  Status   SARS Coronavirus 2 NEGATIVE NEGATIVE Final    Comment: (NOTE) SARS-CoV-2 target nucleic acids are NOT DETECTED.  The SARS-CoV-2 RNA is generally detectable in upper and lower respiratory specimens during the acute phase of infection. Negative results do not preclude SARS-CoV-2 infection, do not rule out co-infections with other pathogens, and should not be used as the sole basis for treatment or other patient management decisions. Negative results must be combined with clinical observations, patient history, and epidemiological information. The expected result is Negative.  Fact Sheet for Patients: SugarRoll.be  Fact Sheet for Healthcare Providers: https://www.woods-mathews.com/  This test is not yet approved or cleared by the [place] FDA and  has been authorized for detection and/or diagnosis of SARS-CoV-2 by FDA under an Emergency Use Authorization (EUA). This EUA will remain  in effect (meaning this test can be used) for the duration of the COVID-19 declaration under Se ction 564(b)(1) of the Act, 21 U.S.C. section 360bbb-3(b)(1), unless the authorization is terminated or revoked sooner.  Performed at [facility], [place] [address] [place], [place] [address]     Coagulation Studies: No results for input(s): LABPROT, INR in the last 72 hours.  Urinalysis: No results for input(s): COLORURINE, LABSPEC, PHURINE, GLUCOSEU, HGBUR, BILIRUBINUR, KETONESUR, PROTEINUR, UROBILINOGEN, NITRITE, LEUKOCYTESUR in the last 72 hours.  Invalid input(s): APPERANCEUR    Imaging: No results found.   Medications:    sodium chloride     sodium chloride     iron sucrose Stopped (01/03/21 1200)   sodium chloride      amLODipine  10 mg Oral Daily   aspirin  81 mg Oral Daily   atorvastatin  20 mg Oral QHS   Chlorhexidine Gluconate Cloth  6 each Topical Q0600   cloNIDine  0.3 mg Oral BID   clopidogrel  75 mg Oral Daily    epoetin (EPOGEN/PROCRIT) injection  10,000 Units Intravenous Q T,Th,Sa-HD   gabapentin  100 mg Oral QHS   heparin  5,000 Units Subcutaneous Q8H   hydrALAZINE  100 mg Oral Q6H   insulin aspart  0-15 Units Subcutaneous TID WC   insulin aspart  3 Units Subcutaneous TID WC   insulin glargine  10 Units Subcutaneous QHS   isosorbide mononitrate  60 mg Oral Daily   levETIRAcetam  500 mg Oral Q24H   metoprolol tartrate  50 mg Oral BID   multivitamin with minerals  1 tablet Oral Daily   nystatin  1 application Topical BID   pantoprazole  40 mg Oral Daily   polyethylene glycol  17 g Oral Daily   senna-docusate  2 tablet Oral BID   vitamin B-12  250 mcg Oral Daily   sodium chloride, sodium chloride, acetaminophen **OR** acetaminophen, alteplase, heparin, HYDROcodone-acetaminophen, hydrocortisone, iohexol, lidocaine (PF), lidocaine-prilocaine, ondansetron **OR** ondansetron (ZOFRAN) IV, pentafluoroprop-tetrafluoroeth, senna-docusate  Assessment/ Plan:  Susan House is a 65 y.o. white female with diabetes mellitus type II, hypertension, CVA with residual left hemiplegia, hyperlipidemia,  congestive heart failure who was admitted to [facility] on 12/14/2020 for AKI (acute kidney injury) ([place]) [N17.9] Altered mental status, unspecified altered mental status type [R41.82] Acute kidney injury superimposed on chronic kidney disease ([place]) [L73.7, V66.8]  Complicated and prolonged hospitalization. Initiated on hemodialysis yesterday.   Acute Kidney Injury on chronic kidney disease stage IV with baseline creatinine 2.32 and GFR  of 23 on 12/03/20. Nephrotic range proteinuria.  Acute kidney injury secondary to ATN, prerenal azotemia and uncontrolled hypertension Chronic kidney disease is secondary to diabetic nephropathy.  Holding enalapril Nonoliguric urine output.  - Dialysis treatment today. Continues to show improvement. No ultrafiltration.   Lab Results  Component Value Date   CREATININE 2.92  (H) 01/04/2021   CREATININE 2.87 (H) 01/03/2021   CREATININE 1.95 (H) 01/02/2021    Intake/Output Summary (Last 24 hours) at 01/04/2021 1005 Last data filed at 01/04/2021 0335 Gross per 24 hour  Intake 600 ml  Output 1200 ml  Net -600 ml     2. Hypertension: 185/71. Volume status at goal.   Current regimen of amlodipine, clonidine, hydralazine, and metoprolol. - Start torsemide 20mg  daily.   3. 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  Hgb A1c 8.6% on 11/29/20  4. Anemia of CKD with iron deficiency.  Lab Results  Component Value Date   HGB 8.1 (L) 01/04/2021  - started on IV iron   LOS: 19 Susan House 6/21/202210:05 AM

## 2021-01-05 ENCOUNTER — Inpatient Hospital Stay: Payer: Medicare Other

## 2021-01-05 LAB — COMPREHENSIVE METABOLIC PANEL
ALT: 10 U/L (ref 0–44)
AST: 27 U/L (ref 15–41)
Albumin: 3.2 g/dL — ABNORMAL LOW (ref 3.5–5.0)
Alkaline Phosphatase: 69 U/L (ref 38–126)
Anion gap: 11 (ref 5–15)
BUN: 28 mg/dL — ABNORMAL HIGH (ref 8–23)
CO2: 25 mmol/L (ref 22–32)
Calcium: 8.9 mg/dL (ref 8.9–10.3)
Chloride: 101 mmol/L (ref 98–111)
Creatinine, Ser: 2.68 mg/dL — ABNORMAL HIGH (ref 0.44–1.00)
GFR, Estimated: 19 mL/min — ABNORMAL LOW (ref >60)
Glucose, Bld: 184 mg/dL — ABNORMAL HIGH (ref 70–99)
Potassium: 3.6 mmol/L (ref 3.5–5.1)
Sodium: 137 mmol/L (ref 135–145)
Total Bilirubin: 0.6 mg/dL (ref 0.3–1.2)
Total Protein: 6.8 g/dL (ref 6.5–8.1)

## 2021-01-05 LAB — CBC WITH DIFFERENTIAL/PLATELET
Abs Immature Granulocytes: 0.04 10*3/uL (ref 0.00–0.07)
Basophils Absolute: 0.1 10*3/uL (ref 0.0–0.1)
Basophils Relative: 1 %
Eosinophils Absolute: 0.2 10*3/uL (ref 0.0–0.5)
Eosinophils Relative: 3 %
HCT: 28.2 % — ABNORMAL LOW (ref 36.0–46.0)
Hemoglobin: 9.4 g/dL — ABNORMAL LOW (ref 12.0–15.0)
Immature Granulocytes: 1 %
Lymphocytes Relative: 10 %
Lymphs Abs: 0.7 10*3/uL (ref 0.7–4.0)
MCH: 29.5 pg (ref 26.0–34.0)
MCHC: 33.3 g/dL (ref 30.0–36.0)
MCV: 88.4 fL (ref 80.0–100.0)
Monocytes Absolute: 0.7 10*3/uL (ref 0.1–1.0)
Monocytes Relative: 11 %
Neutro Abs: 4.9 10*3/uL (ref 1.7–7.7)
Neutrophils Relative %: 74 %
Platelets: 207 10*3/uL (ref 150–400)
RBC: 3.19 MIL/uL — ABNORMAL LOW (ref 3.87–5.11)
RDW: 14 % (ref 11.5–15.5)
WBC: 6.6 10*3/uL (ref 4.0–10.5)
nRBC: 0 % (ref 0.0–0.2)

## 2021-01-05 LAB — CBC
HCT: 29.3 % — ABNORMAL LOW (ref 36.0–46.0)
Hemoglobin: 9.8 g/dL — ABNORMAL LOW (ref 12.0–15.0)
MCH: 29.8 pg (ref 26.0–34.0)
MCHC: 33.4 g/dL (ref 30.0–36.0)
MCV: 89.1 fL (ref 80.0–100.0)
Platelets: 216 10*3/uL (ref 150–400)
RBC: 3.29 MIL/uL — ABNORMAL LOW (ref 3.87–5.11)
RDW: 14.4 % (ref 11.5–15.5)
WBC: 6.8 10*3/uL (ref 4.0–10.5)
nRBC: 0 % (ref 0.0–0.2)

## 2021-01-05 LAB — RENAL FUNCTION PANEL
Albumin: 2.7 g/dL — ABNORMAL LOW (ref 3.5–5.0)
Anion gap: 5 (ref 5–15)
BUN: 26 mg/dL — ABNORMAL HIGH (ref 8–23)
CO2: 30 mmol/L (ref 22–32)
Calcium: 8.5 mg/dL — ABNORMAL LOW (ref 8.9–10.3)
Chloride: 102 mmol/L (ref 98–111)
Creatinine, Ser: 1.99 mg/dL — ABNORMAL HIGH (ref 0.44–1.00)
GFR, Estimated: 28 mL/min — ABNORMAL LOW (ref >60)
Glucose, Bld: 152 mg/dL — ABNORMAL HIGH (ref 70–99)
Phosphorus: 3.3 mg/dL (ref 2.5–4.6)
Potassium: 3.6 mmol/L (ref 3.5–5.1)
Sodium: 137 mmol/L (ref 135–145)

## 2021-01-05 LAB — GLUCOSE, CAPILLARY
Glucose-Capillary: 179 mg/dL — ABNORMAL HIGH (ref 70–99)
Glucose-Capillary: 243 mg/dL — ABNORMAL HIGH (ref 70–99)
Glucose-Capillary: 169 mg/dL — ABNORMAL HIGH (ref 70–99)
Glucose-Capillary: 228 mg/dL — ABNORMAL HIGH (ref 70–99)

## 2021-01-05 MED ORDER — LORAZEPAM 2 MG/ML IJ SOLN
1.0000 mg | Freq: Once | INTRAMUSCULAR | Status: AC
  Administered 2021-01-05: 1 mg via INTRAVENOUS

## 2021-01-05 MED ORDER — LORAZEPAM 2 MG/ML IJ SOLN
INTRAMUSCULAR | Status: AC
  Filled 2021-01-05: qty 1

## 2021-01-05 MED ORDER — HYDRALAZINE HCL 20 MG/ML IJ SOLN
10.0000 mg | Freq: Four times a day (QID) | INTRAMUSCULAR | Status: DC | PRN
  Administered 2021-01-05 – 2021-01-15 (×4): 10 mg via INTRAVENOUS
  Filled 2021-01-05 (×4): qty 1

## 2021-01-05 NOTE — Progress Notes (Signed)
Secure chat to Dr. Horris Latino regarding BP this morning.

## 2021-01-05 NOTE — Progress Notes (Signed)
Occupational Therapy Treatment Patient Details Name: Susan House MRN: [id number] DOB: 07-24-1955 Today's Date: 01/05/2021    History of present illness Pt is a 65 y.o. female with medical history significant for history of CVA with residual left-sided weakness, left frontal encephalomalacia, CKD stage IIIb-IV, insulin-dependent type 2 diabetes, hypertension, hyperlipidemia, seizure disorder, and memory loss who presents to the ED for evaluation of generalized weakness and abnormal labs, potential UTI. Patient recently admitted 11/28/2020-12/03/2020 for AKI on CKD, discharged to SNF.   OT comments  Pt seen for OT treatment on this date. Upon arrival to room, pt sitting in recliner with husband at bedside. Pt had been sitting in recliner for ~3 hours, with pt appearing to be shifting to L-side independently to offload sacral pressure. Pt with eyes open, but unable to answer y/n questions from OT. Pt also appearing very fatigued and grimacing at times, however unable to verbalize pain/discomfort. Pt indicated preference to return to bed. Pt was very fatigued and required total A+2 for lateral scoot transfer with chuck pad towards L-side with arm rest of recliner down. Following transfer, pt required intermittent MIN A to maintain balance while sitting EOB with feet supported. Pt required total A for sit>supine transfer and MOD A for bed rolling to L-side. Pt engaged AAROM exercises for L hand and resting hand splint donned. Pt continues to benefit from skilled OT services to maximize return to PLOF and minimize risk of future falls, injury, caregiver burden, and readmission. Will continue to follow POC. Discharge recommendation remains appropriate.     Follow Up Recommendations  SNF;Supervision/Assistance - 24 hour    Equipment Recommendations  Other (comment) (defer to next venue of care)       Precautions / Restrictions Precautions Precautions: Fall Restrictions Weight Bearing  Restrictions: No       Mobility Bed Mobility Overal bed mobility: Needs Assistance Bed Mobility: Sit to Supine Rolling: Mod assist (MOD A to roll L side)   Supine to sit: Total assist          Transfers Overall transfer level: Needs assistance Equipment used: None Transfers: Lateral/Scoot Transfers          Lateral/Scoot Transfers: Total assist;+2 physical assistance General transfer comment: Transferred towards the L side with recliner arm rest down and use of chuck pad for total A transfer back to bed    Balance Overall balance assessment: Needs assistance Sitting-balance support: Feet supported;Single extremity supported Sitting balance-Leahy Scale: Poor Sitting balance - Comments: Intermittent MIN A trunk support while sitting EOB with feet supported and unilateral UE support Postural control: Left lateral lean   Standing balance-Leahy Scale: Zero Standing balance comment: unable/unsafe to attempt this date                           ADL either performed or assessed with clinical judgement   ADL Overall ADL's : Needs assistance/impaired                                              Cognition Arousal/Alertness: Awake/alert Behavior During Therapy: WFL for tasks assessed/performed Overall Cognitive Status: History of cognitive impairments - at baseline  General Comments: Pt with eyes open, but appearing very fatigued and not answering y/n questions from therapist. Pt grimacing and appearing uncomfortable, however unable to verbalize pain/discomfort                   Pertinent Vitals/ Pain       Pain Assessment: No/denies pain         Frequency  Min 2X/week        Progress Toward Goals  OT Goals(current goals can now be found in the care plan section)  Progress towards OT goals: Progressing toward goals  Acute Rehab OT Goals Patient Stated Goal: To get stronger OT  Goal Formulation: With patient Time For Goal Achievement: 01/13/21 Potential to Achieve Goals: [place] Discharge plan remains appropriate;Frequency remains appropriate       AM-PAC OT "6 Clicks" Daily Activity     Outcome Measure   Help from another person eating meals?: A Little Help from another person taking care of personal grooming?: A Little Help from another person toileting, which includes using toliet, bedpan, or urinal?: Total Help from another person bathing (including washing, rinsing, drying)?: A Lot Help from another person to put on and taking off regular upper body clothing?: A Lot Help from another person to put on and taking off regular lower body clothing?: A Lot 6 Click Score: 13    End of Session Equipment Utilized During Treatment: Gait belt  OT Visit Diagnosis: Unsteadiness on feet (R26.81);Muscle weakness (generalized) (M62.81)   Activity Tolerance Patient limited by fatigue   Patient Left in bed;with call bell/phone within reach;with bed alarm set;with family/visitor present   Nurse Communication Mobility status        Time: 1347-1410 OT Time Calculation (min): 23 min  Charges: OT Treatments $Therapeutic Activity: 23-37 mins  Fredirick Maudlin, OTR/L [place]

## 2021-01-05 NOTE — Progress Notes (Signed)
Rapid Response Event Note   Reason for Call : Code Stroke   Initial Focused Assessment: Pt alert but unable to follow commands. Pt is mute. Does not look at you when name called. Pt difficult to assess d/t restlessness. Neurologist at bedside performing neuro assessment. Pt has some neuro deficits at baseline but primary RN says pt was alert and oriented earlier this afternoon. Pt on room air. VSS. Pt has history of seizures and old strokes. Pt is DNR. Last known well was about 1215 (out of Tpa window per neurology).   Interventions: Neuro assessment performed by neurologist. Pt placed on RR monitor. VSS on room air. Pt taken to CT. On arrival back to pt's room, 1mg  of Ativan administered and labs ordered by neurologist. Still no change in mentation.   Plan of Care: Waiting on results of ordered lab work. No need for CTA or MRI per neurology. Will transfer pt to PCU when bed is clean.   Event Summary: Code stroke called on patient who is now unable to follow commands and has mental status change. Neurologist at bedside. Pt taken for head CT. Pt given 1mg  of ativan when returned to room. No change noted in pt's mental status at this time. Orders placed for pt to transfer to PCU for closer monitoring.  MD Notified: Dr. Quinn Axe (neuro) Call Time: 1603 Arrival Time: 3143 End Time: 1710  Trellis Paganini, RN

## 2021-01-05 NOTE — Progress Notes (Signed)
[facility]  ROUNDING NOTE   Subjective:   Hemodialysis treatment yesterday. Patient states that she felt worse after treatment. No ultrafiltration.   UOP 1100  Started torsemide yesterday.    Objective:  Vital signs in last 24 hours:  Temp:  [98.1 F (36.7 C)-99.7 F (37.6 C)] 98.4 F (36.9 C) (06/22 0830) Pulse Rate:  [63-76] 76 (06/22 0923) Resp:  [16-18] 16 (06/22 0830) BP: (132-218)/(36-78) 132/36 (06/22 0923) SpO2:  [95 %-99 %] 99 % (06/22 0830)  Weight change:  Filed Weights   01/01/21 0412 01/02/21 0553 01/03/21 0414  Weight: 93.2 kg 93 kg 93.1 kg    Intake/Output: I/O last 3 completed shifts: In: 360 [P.O.:360] Out: 1100 [Urine:1100]   Intake/Output this shift:  Total I/O In: 240 [P.O.:240] Out: -   Physical Exam: General: Laying in bed  Head: Normocephalic, atraumatic.moist oral mucosal membranes  Eyes: Anicteric  Lungs:  Clear    Heart: regular  Abdomen:  Soft, nontender, non distended  Extremities:  No  peripheral edema.  Neurologic:  Alert to self and place.   Skin: No acute lesions or rashes  Access: RIJ permcath    Basic Metabolic Panel: Recent Labs  Lab 01/01/21 0405 01/02/21 0453 01/03/21 0623 01/04/21 0421 01/05/21 0508  NA 138 141 139 137 137  K 4.5 4.2 4.2 4.2 3.6  CL 109 109 104 104 102  CO2 24 28 27 26 30   GLUCOSE 234* 187* 205* 237* 152*  BUN 49* 31* 43* 50* 26*  CREATININE 2.77* 1.95* 2.87* 2.92* 1.99*  CALCIUM 8.4* 8.5* 8.4* 8.6* 8.5*  PHOS  --   --  4.2 5.0* 3.3     Liver Function Tests: Recent Labs  Lab 01/03/21 0623 01/04/21 0421 01/05/21 0508  ALBUMIN 2.6* 2.5* 2.7*    No results for input(s): LIPASE, AMYLASE in the last 168 hours. No results for input(s): AMMONIA in the last 168 hours.  CBC: Recent Labs  Lab 01/01/21 0405 01/02/21 0453 01/03/21 0623 01/04/21 0421 01/05/21 0508  WBC 7.0 5.5 5.5 6.2 6.6  NEUTROABS 4.4 2.8 3.2 3.9 4.9  HGB 8.8* 9.0* 8.1* 8.1* 9.4*  HCT 26.7* 27.8*  24.5* 24.4* 28.2*  MCV 89.6 87.4 89.7 90.0 88.4  PLT 247 256 198 198 207     Cardiac Enzymes: No results for input(s): CKTOTAL, CKMB, CKMBINDEX, TROPONINI in the last 168 hours.  BNP: Invalid input(s): POCBNP  CBG: Recent Labs  Lab 01/03/21 2106 01/04/21 0745 01/04/21 1623 01/04/21 2101 01/05/21 0737  GLUCAP 190* 218* 271* 155* 179*     Microbiology: Results for orders placed or performed during the hospital encounter of 12/14/20  SARS CORONAVIRUS 2 (TAT 6-24 HRS) Nasopharyngeal Nasopharyngeal Swab     Status: None   Collection Time: 12/14/20  8:28 PM   Specimen: Nasopharyngeal Swab  Result Value Ref Range Status   SARS Coronavirus 2 NEGATIVE NEGATIVE Final    Comment: (NOTE) SARS-CoV-2 target nucleic acids are NOT DETECTED.  The SARS-CoV-2 RNA is generally detectable in upper and lower respiratory specimens during the acute phase of infection. Negative results do not preclude SARS-CoV-2 infection, do not rule out co-infections with other pathogens, and should not be used as the sole basis for treatment or other patient management decisions. Negative results must be combined with clinical observations, patient history, and epidemiological information. The expected result is Negative.  Fact Sheet for Patients: SugarRoll.be  Fact Sheet for Healthcare Providers: https://www.woods-mathews.com/  This test is not yet approved or cleared by the  [place] Architectural technologist and  has been authorized for detection and/or diagnosis of SARS-CoV-2 by FDA under an Print production planner (EUA). This EUA will remain  in effect (meaning this test can be used) for the duration of the COVID-19 declaration under Se ction 564(b)(1) of the Act, 21 U.S.C. section 360bbb-3(b)(1), unless the authorization is terminated or revoked sooner.  Performed at [facility], [place] [address] [place], [place] [address]   SARS CORONAVIRUS 2 (TAT 6-24 HRS)  Nasopharyngeal Nasopharyngeal Swab     Status: None   Collection Time: 12/19/20  4:40 PM   Specimen: Nasopharyngeal Swab  Result Value Ref Range Status   SARS Coronavirus 2 NEGATIVE NEGATIVE Final    Comment: (NOTE) SARS-CoV-2 target nucleic acids are NOT DETECTED.  The SARS-CoV-2 RNA is generally detectable in upper and lower respiratory specimens during the acute phase of infection. Negative results do not preclude SARS-CoV-2 infection, do not rule out co-infections with other pathogens, and should not be used as the sole basis for treatment or other patient management decisions. Negative results must be combined with clinical observations, patient history, and epidemiological information. The expected result is Negative.  Fact Sheet for Patients: SugarRoll.be  Fact Sheet for Healthcare Providers: https://www.woods-mathews.com/  This test is not yet approved or cleared by the [place] FDA and  has been authorized for detection and/or diagnosis of SARS-CoV-2 by FDA under an Emergency Use Authorization (EUA). This EUA will remain  in effect (meaning this test can be used) for the duration of the COVID-19 declaration under Se ction 564(b)(1) of the Act, 21 U.S.C. section 360bbb-3(b)(1), unless the authorization is terminated or revoked sooner.  Performed at [facility], [address], [place], [place] [address]     Coagulation Studies: No results for input(s): LABPROT, INR in the last 72 hours.  Urinalysis: No results for input(s): COLORURINE, LABSPEC, PHURINE, GLUCOSEU, HGBUR, BILIRUBINUR, KETONESUR, PROTEINUR, UROBILINOGEN, NITRITE, LEUKOCYTESUR in the last 72 hours.  Invalid input(s): APPERANCEUR    Imaging: No results found.   Medications:    sodium chloride     sodium chloride     iron sucrose 100 mg (01/04/21 1420)   sodium chloride      amLODipine  10 mg Oral Daily   aspirin  81 mg Oral Daily    atorvastatin  20 mg Oral QHS   Chlorhexidine Gluconate Cloth  6 each Topical Q0600   cloNIDine  0.3 mg Oral BID   clopidogrel  75 mg Oral Daily   feeding supplement (NEPRO CARB STEADY)  237 mL Oral BID BM   gabapentin  100 mg Oral QHS   heparin  5,000 Units Subcutaneous Q8H   hydrALAZINE  100 mg Oral Q6H   insulin aspart  0-15 Units Subcutaneous TID WC   insulin aspart  3 Units Subcutaneous TID WC   insulin glargine  10 Units Subcutaneous QHS   isosorbide mononitrate  60 mg Oral Daily   levETIRAcetam  500 mg Oral Q24H   metoprolol tartrate  50 mg Oral BID   multivitamin  1 tablet Oral QHS   nystatin  1 application Topical BID   pantoprazole  40 mg Oral Daily   polyethylene glycol  17 g Oral Daily   senna-docusate  2 tablet Oral BID   torsemide  20 mg Oral Daily   vitamin B-12  250 mcg Oral Daily   sodium chloride, sodium chloride, acetaminophen **OR** acetaminophen, alteplase, heparin, HYDROcodone-acetaminophen, hydrocortisone, iohexol, lidocaine (PF), lidocaine-prilocaine, ondansetron **OR**  ondansetron (ZOFRAN) IV, pentafluoroprop-tetrafluoroeth, senna-docusate  Assessment/ Plan:  [name] is a 65 y.o. white female with diabetes mellitus type II, hypertension, CVA with residual left hemiplegia, hyperlipidemia, congestive heart failure who was admitted to [facility] on 12/14/2020 for AKI (acute kidney injury) ([place]) [N17.9] Altered mental status, unspecified altered mental status type [R41.82] Acute kidney injury superimposed on chronic kidney disease ([place]) [C58.5, I77.8]  Complicated and prolonged hospitalization. Initiated on hemodialysis on 6/17  Acute Kidney Injury on chronic kidney disease stage IV with baseline creatinine 2.32 and GFR  of 23 on 12/03/20. Nephrotic range proteinuria.  Acute kidney injury secondary to ATN, prerenal azotemia and uncontrolled hypertension Chronic kidney disease is secondary to diabetic nephropathy.  Holding enalapril Nonoliguric urine  output.  - Hold dialysis and monitor urine output, volume status, serum electrolytes and renal function.  - Outpatient planning for [facility]. Patient has not sat in a chair for treatment yet.   Lab Results  Component Value Date   CREATININE 1.99 (H) 01/05/2021   CREATININE 2.92 (H) 01/04/2021   CREATININE 2.87 (H) 01/03/2021    Intake/Output Summary (Last 24 hours) at 01/05/2021 1037 Last data filed at 01/05/2021 1013 Gross per 24 hour  Intake 360 ml  Output 1100 ml  Net -740 ml     2. Hypertension: 132/36. Volume status at goal.   Current regimen of amlodipine, clonidine, hydralazine, isosorbide mononitrate, metoprolol and torsemide.   3. 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  Hgb A1c 8.6% on 11/29/20  4. Anemia of CKD with iron deficiency.  Lab Results  Component Value Date   HGB 9.4 (L) 01/05/2021  - started on IV iron on 6/19.    LOS: [place] 6/22/202210:37 AM

## 2021-01-05 NOTE — Progress Notes (Signed)
IV team consulted for difficult start.  Code stroke called.  Upon arrival, pt being transported out of room.  RN advised IVT that access was obtained in the [facility] and consult may be canceled

## 2021-01-05 NOTE — Progress Notes (Signed)
Physical Therapy Treatment Patient Details Name: Susan House MRN: [id number] DOB: [birth year] Today's Date: 01/05/2021    History of Present Illness Pt is a 65 y.o. female with medical history significant for history of CVA with residual left-sided weakness, left frontal encephalomalacia, CKD stage IIIb-IV, insulin-dependent type 2 diabetes, hypertension, hyperlipidemia, seizure disorder, and memory loss who presents to the ED for evaluation of generalized weakness and abnormal labs, potential UTI. Patient recently admitted 11/28/2020-12/03/2020 for AKI on CKD, discharged to SNF.    PT Comments    Treatment emphasis on OOB to chair per request of care team (goal of OOB to chair for dialysis.  Requiring increased assist for unsupported sitting balance (L lateral lean) and max/total assist +2 for lateral scoot over level surface from bed/chair.  Constant physical assist from therapist for trunk position/control, lift off and lateral movement. Indicated some degree of pain with initial positioning in chair (low back, sacrum?), but unable to articulate; appeared to improve with repositioning and improved alignment. RN informed/aware of patient performance and status end of session; OT to assist with return to bed after lunch.    Follow Up Recommendations  SNF     Equipment Recommendations  None recommended by PT;Other (comment)    Recommendations for Other Services       Precautions / Restrictions Precautions Precautions: Fall Restrictions Weight Bearing Restrictions: No    Mobility  Bed Mobility Overal bed mobility: Needs Assistance Bed Mobility: Supine to Sit     Supine to sit: Max assist          Transfers Overall transfer level: Needs assistance   Transfers: Lateral/Scoot Transfers          Lateral/Scoot Transfers: Total assist;Max assist;+2 physical assistance General transfer comment: consistent physical assist for trunk position/control, weight shift, lift  off and lateral movement  Ambulation/Gait             General Gait Details: unable/unsafe   Stairs             Wheelchair Mobility    Modified Rankin (Stroke Patients Only)       Balance Overall balance assessment: Needs assistance Sitting-balance support: No upper extremity supported;Feet supported Sitting balance-Leahy Scale: Poor Sitting balance - Comments: L lateral lean at times, limited attempts at self-correction Postural control: Left lateral lean                                  Cognition Arousal/Alertness: Awake/alert Behavior During Therapy: Flat affect Overall Cognitive Status: Difficult to assess                                 General Comments: eyes open, tracks therapist; intermittent nods yes/no and attempts to verbalize (incoherently)      Exercises      General Comments        Pertinent Vitals/Pain Pain Assessment: Faces Faces Pain Scale: Hurts even more Pain Location: unable to verbalize, appears in low back/sacral area Pain Descriptors / Indicators: Grimacing Pain Intervention(s): Limited activity within patient's tolerance;Repositioned;Monitored during session    Home Living                      Prior Function            PT Goals (current goals can now be found in the care plan section) Acute  Rehab PT Goals Patient Stated Goal: To get stronger PT Goal Formulation: With patient Time For Goal Achievement: 01/11/21 Potential to Achieve Goals: [place] Progress towards PT goals: Not progressing toward goals - comment    Frequency    Min 2X/week      PT Plan Current plan remains appropriate    Co-evaluation              AM-PAC PT "6 Clicks" Mobility   Outcome Measure  Help needed turning from your back to your side while in a flat bed without using bedrails?: A Lot Help needed moving from lying on your back to sitting on the side of a flat bed without using bedrails?: A  Lot Help needed moving to and from a bed to a chair (including a wheelchair)?: A Lot Help needed standing up from a chair using your arms (e.g., wheelchair or bedside chair)?: Total Help needed to walk in hospital room?: Total Help needed climbing 3-5 steps with a railing? : Total 6 Click Score: 9    End of Session   Activity Tolerance: Patient limited by fatigue Patient left: in chair;with chair alarm set;with family/visitor present;with call bell/phone within reach Nurse Communication: Mobility status PT Visit Diagnosis: Other abnormalities of gait and mobility (R26.89);Muscle weakness (generalized) (M62.81)     Time: 6151-8343 PT Time Calculation (min) (ACUTE ONLY): 21 min  Charges:  $Therapeutic Activity: 8-22 mins                     Demitrious Mccannon H. Owens Shark, PT, DPT, NCS 01/05/21, 9:03 PM [phone]

## 2021-01-05 NOTE — Progress Notes (Signed)
   01/05/21 1700  Clinical Encounter Type  Visited With Patient  Visit Type Follow-up;Code  Referral From Nurse  Consult/Referral To [place] responded to a code stroke. When Chaplain arrived at the Pt's room the medical team was actively assessing the PT. Chaplain ministered with presence, and made eye contact with the nurse, who stated they were good. Chaplain will try to follow up with PT.

## 2021-01-05 NOTE — Progress Notes (Addendum)
1522: Heparin injection administered at this time. Patient sleepy and continued to try to pull blankets back up to cover her self. Husband at bedside and reported that patient was cold.   1553: In to medicate patient with Hydralazine. Patient appears confused, patient not answering any of my questions at this time. Patient with blank stare towards nurse.  Dr. Horris Latino notified and stated she would come see patient.   1612:Patient unable to follow commands, not speaking. MD notified. Code stroke called at this time.

## 2021-01-05 NOTE — Consult Note (Signed)
NEUROLOGY CONSULTATION NOTE   Date of service: January 05, 2021 Patient Name: Susan House MRN:  [id number] DOB:  [birth year] Reason for consult: acute change in mental status, LKW yesterday per husband _ _ _   _ __   _ __ _ _  __ __   _ __   __ _  History of Present Illness   Susan House is a 65 y.o. female with PMH significant for  has a past medical history of Allergy, Arthritis, Atypical chest pain, Carotid arterial disease ([place]), Cellulitis and abscess of face, Chronic Dizziness, Coronary artery calcification seen on CT scan, Depression, Diabetes mellitus ([place]), Diastolic dysfunction, Edema, GERD (gastroesophageal reflux disease), Headache, Hematuria, Hyperlipidemia, Hypertension, IBS (irritable bowel syndrome), Migraine, Morbid obesity ([place]), Nocturia, Possible Seizure ([place]), Stroke ([place]) (2000), TIA (transient ischemic attack) (2010 & 2019), Urgency of micturation, Urinary frequency, and Urinary incontinence. who is admitted for acute on chronic kidney failure and UTI. Stroke code was called for acute change in mentation, no longer following commands. LKW per nursing staff thought be 1215 today but upon further discussion with husband she has been like this all day (he visited her this morning). NIHSS = 15, nonfocal other than chronic LUE flaccidity with contracture from prior stroke. tPA not administered 2/2 outside the window. CTH NAICP. CTA not performed 2/2 nonfocal exam not c/w LVO, acute on chronic renal failure, and suspicion for alternative etiology other than stroke. Patient does have chart dx seizure and is on keppra but husband cannot tell me any more information about this. She was given 1mg  ativan without change in mentation and not administered more 2/2 no motor evidence of seizure and DNR status.    ROS   UTA 2/2 aphasia  Past History   Past Medical History:  Diagnosis Date   Allergy    Arthritis    Atypical chest pain    a. Reportedly nl cath in the past; b.  12/2009 MV: No ischemia/infarct. EF 87%.   Carotid arterial disease ([place])    a. 09/8099 CTA head/neck: LICA 75Z, RICA 02-->HEN rx.   Cellulitis and abscess of face    Chronic Dizziness    Coronary artery calcification seen on CT scan    a. 10/2016 CTA chest: Atherosclerotic calcifications Ao, cors, and prox great vessels.   Depression    Diabetes mellitus ([place])    Diastolic dysfunction    a. 2008 Echo: Nl EF; b. 2011 Echo: Nl EF; c. 06/2016 Echo: EF 55-60%, Gr1DD; d. 03/2018 Echo: EF 55-60%, no rwma, Gr1DD, mod dil LA.   Edema    GERD (gastroesophageal reflux disease)    Headache    Hematuria    Hyperlipidemia    Hypertension    IBS (irritable bowel syndrome)    Migraine    Morbid obesity ([place])    Nocturia    Possible Seizure ([place])    a. 10/2016 ? sz. MRI neg for stroke.   Stroke [facility]) 2000   TIA (transient ischemic attack) 2010 & 2019   a. 2010; b. 03/2018 Left sided wkns-->MRI neg for stroke. CTA head/neck: Right A2 cut off/occlusion w/ collats extending over R cerebral convexity. LICA 27P, RICA 45, RSubclavian 50->med rx.   Urgency of micturation    Urinary frequency    Urinary incontinence    Past Surgical History:  Procedure Laterality Date   ABDOMINAL HYSTERECTOMY     ABDOMINOPLASTY     APPENDECTOMY     BACK SURGERY  extensive spinal fusion   carpel tunnel surgery     CATARACT EXTRACTION     x 2   CESAREAN SECTION     CHOLECYSTECTOMY     COLONOSCOPY  2010   COLONOSCOPY WITH PROPOFOL N/A 07/11/2016   Procedure: COLONOSCOPY WITH PROPOFOL;  Surgeon: Lucilla Lame, MD;  Location: [facility] ENDOSCOPY;  Service: Endoscopy;  Laterality: N/A;   COLONOSCOPY WITH PROPOFOL N/A 03/26/2018   Procedure: COLONOSCOPY WITH PROPOFOL;  Surgeon: Lucilla Lame, MD;  Location: [facility] ENDOSCOPY;  Service: Endoscopy;  Laterality: N/A;   DIALYSIS/PERMA CATHETER INSERTION N/A 12/31/2020   Procedure: DIALYSIS/PERMA CATHETER INSERTION;  Surgeon: Algernon Huxley, MD;  Location: [place] CV LAB;  Service:  Cardiovascular;  Laterality: N/A;   ESOPHAGOGASTRODUODENOSCOPY (EGD) WITH PROPOFOL N/A 03/26/2018   Procedure: ESOPHAGOGASTRODUODENOSCOPY (EGD) WITH PROPOFOL;  Surgeon: Lucilla Lame, MD;  Location: [facility] ENDOSCOPY;  Service: Endoscopy;  Laterality: N/A;   EYE SURGERY     HERNIA REPAIR     SHOULDER SURGERY     TEE WITHOUT CARDIOVERSION N/A 05/07/2020   Procedure: TRANSESOPHAGEAL ECHOCARDIOGRAM (TEE);  Surgeon: Nelva Bush, MD;  Location: [facility] ORS;  Service: Cardiovascular;  Laterality: N/A;   TOE SURGERY     UPPER GI ENDOSCOPY     WRIST FRACTURE SURGERY Left    Family History  Problem Relation Age of Onset   Hyperlipidemia Mother    Arrhythmia Mother        WPW   Hypertension Mother    Rheum arthritis Mother    Heart disease Mother    Heart attack Father 31   Hyperlipidemia Father    Hypertension Father    Stroke Father    Diabetes Father    Heart disease Father    Coronary artery disease Father    Diabetes Sister    Hyperlipidemia Sister    Hypertension Sister    Depression Sister    Depression Sister    Diabetes Sister    Hypertension Sister    Hyperlipidemia Sister    Kidney disease Paternal Grandmother    Social History   Socioeconomic History   Marital status: Married    Spouse name: Susan House    Number of children: 2   Years of education: 57   Highest education level: Not on file  Occupational History   Occupation: Disabled    Occupation: retired  Tobacco Use   Smoking status: Never   Smokeless tobacco: Never  Scientific laboratory technician Use: Never used  Substance and Sexual Activity   Alcohol use: No   Drug use: No   Sexual activity: Never  Other Topics Concern   Not on file  Social History Narrative   Patient lives in [place] at home with husband Susan House.    Patient has 2 children and 2 step children.    Patient has 12+ years of education.    Patient is Disabled since stroke in 2000.   Social Determinants of Health   Financial Resource Strain: Not  on file  Food Insecurity: Not on file  Transportation Needs: Not on file  Physical Activity: Not on file  Stress: Not on file  Social Connections: Not on file   Allergies  Allergen Reactions   Morphine And Related Anaphylaxis   Other     Green peas   Reglan [Metoclopramide]     Other reaction(s): Unknown Elevated BP   Simvastatin Other (See Comments)    Muscle pain   Betadine [Povidone Iodine] Rash   Tetracyclines & Related Rash  Other reaction(s): Unknown    Medications   Medications Prior to Admission  Medication Sig Dispense Refill Last Dose   acetaminophen (TYLENOL) 325 MG tablet Take 2 tablets (650 mg total) by mouth every 6 (six) hours as needed for mild pain (or Fever >/= 101).   prn at prn   amLODipine (NORVASC) 10 MG tablet TAKE 1 TABLET BY MOUTH  DAILY 90 tablet 3 unknown at unknown   aspirin 81 MG chewable tablet Chew 1 tablet (81 mg total) by mouth daily.   unknown at unknown   atorvastatin (LIPITOR) 20 MG tablet TAKE 1 TABLET BY MOUTH  DAILY 90 tablet 1 unknown at unknown   cloNIDine (CATAPRES) 0.3 MG tablet Take 0.3 mg by mouth 2 (two) times daily.   unknown at unknown   clopidogrel (PLAVIX) 75 MG tablet TAKE 1 TABLET BY MOUTH  DAILY 90 tablet 3 unknown at unknown   Dimethicone (CAVILON DURABLE BARRIER) 1.3 % CREA Apply barrier cream PRN 92 g 0 prn at prn   enalapril (VASOTEC) 5 MG tablet Take 1 tablet (5 mg total) by mouth daily. (Patient taking differently: Take 10 mg by mouth daily.)   unknown at unknown   famotidine (PEPCID) 20 MG tablet Take 1 tablet (20 mg total) by mouth daily. 30 tablet 0 unknown at unknown   gabapentin (NEURONTIN) 100 MG capsule Take 200 mg by mouth 2 (two) times daily.    unknown at unknown   hydrALAZINE (APRESOLINE) 50 MG tablet Take 1.5 tablets (75 mg total) by mouth 3 (three) times daily.   unknown at unonown   insulin aspart (NOVOLOG) 100 UNIT/ML injection Inject 0-20 Units into the skin 3 (three) times daily with meals. 10 mL 11  unknown at unknown   insulin glargine (LANTUS) 100 UNIT/ML injection Inject 0.12 mLs (12 Units total) into the skin at bedtime. (Patient taking differently: Inject 14 Units into the skin at bedtime.) 10 mL 11 unknown at unknown   levETIRAcetam (KEPPRA) 500 MG tablet Take 1 tablet (500 mg total) by mouth 2 (two) times daily. 180 tablet 3 unknown at unknown   metoprolol succinate (TOPROL-XL) 100 MG 24 hr tablet TAKE 1 TABLET BY MOUTH  DAILY 90 tablet 3 unknown at unknown   nitroGLYCERIN (NITROSTAT) 0.4 MG SL tablet Place 1 tablet (0.4 mg total) under the tongue every 5 (five) minutes as needed for chest pain. 25 tablet 0 prn at prn   nystatin (MYCOSTATIN/NYSTOP) powder Apply 1 application topically 2 (two) times daily. Apply to breast folds for skin breakdown.   unknown at unknown   ondansetron (ZOFRAN) 4 MG tablet Take 1 tablet (4 mg total) by mouth every 8 (eight) hours as needed for nausea or vomiting. 60 tablet 5 prn at prn   pantoprazole (PROTONIX) 40 MG tablet TAKE 1 TABLET BY MOUTH  DAILY 90 tablet 3 unknown at unknown   vitamin B-12 (CYANOCOBALAMIN) 250 MCG tablet Take 1 tablet (250 mcg total) by mouth daily.   unknown at unknown   ACCU-CHEK AVIVA PLUS test strip TEST BLOOD SUGAR 3 TIMES  DAILY 300 strip 3      Vitals   Vitals:   01/05/21 0923 01/05/21 1545 01/05/21 1547 01/05/21 1655  BP: (!) 132/36 (!) 172/36 (!) 172/54 (!) 156/78  Pulse: 76 80 79   Resp:  18    Temp:  99 F (37.2 C)    TempSrc:  Oral    SpO2:  95% 96%   Weight:      Height:  Body mass index is 37.54 kg/m.  Physical Exam   Physical Exam Gen: alert, unable to answer orientation questions 2/2 disorientation vs aphasia Resp: CTAB, no w/r/r CV: RRR, no m/g/r; nml S1 and S2. 2+ symmetric peripheral pulses.  Neuro: *MS: alert, unable to answer orientation questions 2/2 disorientation vs aphasia. Does not follow simple commands. *Speech: mute *CN: PERRL, EOMI by oculocephalics, blinks to threat bilat,  face symmetric *Motor: does not cooperate with formal strength exam but minimal movement LUE, spontaneous anti-gravity RUE, some movement against gravity BLE Sensory: SILT  NIHSS  1a Level of Conscious.: 0 1b LOC Questions: 2 1c LOC Commands: 2 2 Best Gaze: 0 3 Visual: 0 4 Facial Palsy: 0 5a Motor Arm - left: 3 5b Motor Arm - Right: 1 6a Motor Leg - Left: 2 6b Motor Leg - Right: 2 7 Limb Ataxia: 0 8 Sensory: 0 9 Best Language: 3 10 Dysarthria: 0 11 Extinct. and Inatten.: 0  TOTAL: 15   Premorbid mRS = 3   Labs   CBC:  Recent Labs  Lab 01/04/21 0421 01/05/21 0508 01/05/21 1751  WBC 6.2 6.6 6.8  NEUTROABS 3.9 4.9  --   HGB 8.1* 9.4* 9.8*  HCT 24.4* 28.2* 29.3*  MCV 90.0 88.4 89.1  PLT 198 207 342    Basic Metabolic Panel:  Lab Results  Component Value Date   NA 137 01/05/2021   K 3.6 01/05/2021   CO2 25 01/05/2021   GLUCOSE 184 (H) 01/05/2021   BUN 28 (H) 01/05/2021   CREATININE 2.68 (H) 01/05/2021   CALCIUM 8.9 01/05/2021   GFRNONAA 19 (L) 01/05/2021   GFRAA 46 (L) 12/24/2019   Lipid Panel:  Lab Results  Component Value Date   LDLCALC 41 12/24/2019   HgbA1c:  Lab Results  Component Value Date   HGBA1C 8.6 (H) 11/29/2020   Urine Drug Screen: No results found for: LABOPIA, COCAINSCRNUR, LABBENZ, AMPHETMU, THCU, LABBARB  Alcohol Level No results found for: [place]   Impression   65 yo woman with hx multiple prior strokes and chart hx seizure disorder on keppra admitted for acute on chronic kidney failure with UTI. Stroke code called for mental status change but per husband she has not been acting herself all day. It is possible she is post-ictal or that she has worsening infection. She has no new focal neuro deficits therefore stroke is low on the differential but could consider MRI brain if medical workup unrevealing.  Recommendations   - CMP, CBC, UA, Ucx pending - rEEG tmrw - Consider MRI brain if encephalopathy persists and above workup is  unrevealing - Continue keppra current renal dosing  Will continue to follow. ______________________________________________________________________   Thank you for the opportunity to take part in the care of this patient. If you have any further questions, please contact the neurology consultation attending.  Signed,  Su Monks, MD [facility] [phone]  If 7pm- 7am, please page neurology on call as listed in [place].

## 2021-01-05 NOTE — Progress Notes (Addendum)
PROGRESS NOTE    Susan House  GDJ:[id number] DOB: January 09, 1956 DOA: 12/14/2020 PCP: Jerrol Banana., MD   Brief Narrative:  65 y.o. female with medical history significant for history of CVA with residual left-sided weakness, left frontal encephalomalacia, CKD stage IIIb-IV, insulin-dependent type 2 diabetes, hypertension, hyperlipidemia, seizure disorder, and memory loss who presents to the ED for evaluation of generalized weakness and abnormal labs, found to have AKI on CKD and evidence of urinary tract infection.  Patient admitted for further management.  Due to worsening kidney function, nephrology consulted, HD was started on 12/31/2020.  Patient still making urine and BP still remains labile.   Assessment & Plan:   Principal Problem:   Acute kidney injury superimposed on chronic kidney disease ([place]) Active Problems:   Hypertension associated with diabetes ([place])   Type 2 diabetes mellitus with complications ([place])   Hemiparesis due to old cerebrovascular accident [facility])   Hyperlipidemia associated with type 2 diabetes mellitus ([place])   Hx of completed stroke   AKI (acute kidney injury) ([place])   Protein-calorie malnutrition, severe  Progression to end-stage kidney disease Started on HD on 6/17, making urine Further HD sessions per nephrology  Renal ultrasound with Doppler rule showed 10.8 x 5.6 cm cystic mass on the right side.  CT abdomen pelvis did not show this mass  Acute metabolic encephalopathy ??  Postictal state, ??  Seizure UA, UC and CXR ordered Code stroke was activated, CT head negative for any acute intracranial abnormality Neurology consulted  Refractory/labile hypertension  Continue current regimen of amlodipine, clonidine, hydralazine, metoprolol, imdur, torsemide, pending BP   Urinary tract infection Completed 3 days of Rocephin.   Anemia of chronic kidney disease and B12 deficiency Hemoglobin stable s/p 1 PRBC  Insulin-dependent type 2  diabetes, uncontrolled A1c 8.6% SSI, Lantus, NovoLog 3 times daily, Accu-Cheks, hypoglycemic protocol   History of CVA with residual left-sided weakness/left frontal encephalomalacia Continue aspirin, Plavix, atorvastatin.   Seizure disorder Continue Keppra Seizure precaution   Memory loss Suspected underlying vascular dementia.   She had some reported confusion/delirium prior to arrival.   Baseline level of mentation is unclear CT head significant for chronic volume loss and small vessel ischemic changes consistent with suspected vascular dementia Outpatient follow-up   Constipation Continue Senokot-S and MiraLAX   DVT prophylaxis: SQ heparin Code Status: DNR Family Communication: Daughter [name] [phone] Disposition Plan: Status is: Inpatient  Remains inpatient appropriate because:Inpatient level of care appropriate due to severity of illness  Dispo: The patient is from: SNF              Anticipated d/c is to: SNF              Patient currently is not medically stable to d/c.   Difficult to place patient No  New start hemodialysis during this admission.  Nephrology following for inpatient hemodialysis needs.  Disposition plan pending.   Level of care: Med-Surg  Consultants:  Nephrology  Procedures:  Permacath placement  Antimicrobials:  None   Subjective: Patient seen earlier this morning, denies any new complaints.  Called by RN after patient returned from HD who noted patient was altered from new baseline, appeared lethargic, not able to follow commands or respond to questions asked.  Code stroke was activated, CT head negative for any acute intracranial abnormality.  Neurology consulted, monitor closely  Objective: Vitals:   01/05/21 0923 01/05/21 1545 01/05/21 1547 01/05/21 1655  BP: (!) 132/36 (!) 172/36 (!) 172/54 (!) 156/78  Pulse: 76 80 79   Resp:  18    Temp:  99 F (37.2 C)    TempSrc:  Oral    SpO2:  95% 96%   Weight:      Height:         Intake/Output Summary (Last 24 hours) at 01/05/2021 1841 Last data filed at 01/05/2021 1547 Gross per 24 hour  Intake 360 ml  Output 1800 ml  Net -1440 ml   Filed Weights   01/01/21 0412 01/02/21 0553 01/03/21 0414  Weight: 93.2 kg 93 kg 93.1 kg    Examination: General: NAD, lethargic Cardiovascular: S1, S2 present Respiratory: CTAB Abdomen: Soft, nontender, nondistended, bowel sounds present Musculoskeletal: No bilateral pedal edema noted Skin: Normal Psychiatry: Normal mood     Data Reviewed: I have personally reviewed following labs and imaging studies  CBC: Recent Labs  Lab 01/01/21 0405 01/02/21 0453 01/03/21 0623 01/04/21 0421 01/05/21 0508 01/05/21 1751  WBC 7.0 5.5 5.5 6.2 6.6 6.8  NEUTROABS 4.4 2.8 3.2 3.9 4.9  --   HGB 8.8* 9.0* 8.1* 8.1* 9.4* 9.8*  HCT 26.7* 27.8* 24.5* 24.4* 28.2* 29.3*  MCV 89.6 87.4 89.7 90.0 88.4 89.1  PLT 247 256 198 198 207 194   Basic Metabolic Panel: Recent Labs  Lab 01/02/21 0453 01/03/21 0623 01/04/21 0421 01/05/21 0508 01/05/21 1751  NA 141 139 137 137 137  K 4.2 4.2 4.2 3.6 3.6  CL 109 104 104 102 101  CO2 28 27 26 30 25   GLUCOSE 187* 205* 237* 152* 184*  BUN 31* 43* 50* 26* 28*  CREATININE 1.95* 2.87* 2.92* 1.99* 2.68*  CALCIUM 8.5* 8.4* 8.6* 8.5* 8.9  PHOS  --  4.2 5.0* 3.3  --    GFR: Estimated Creatinine Clearance: 22.5 mL/min (A) (by C-G formula based on SCr of 2.68 mg/dL (H)). Liver Function Tests: Recent Labs  Lab 01/03/21 0623 01/04/21 0421 01/05/21 0508 01/05/21 1751  AST  --   --   --  27  ALT  --   --   --  10  ALKPHOS  --   --   --  69  BILITOT  --   --   --  0.6  PROT  --   --   --  6.8  ALBUMIN 2.6* 2.5* 2.7* 3.2*   No results for input(s): LIPASE, AMYLASE in the last 168 hours. No results for input(s): AMMONIA in the last 168 hours. Coagulation Profile: No results for input(s): INR, PROTIME in the last 168 hours. Cardiac Enzymes: No results for input(s): CKTOTAL, CKMB,  CKMBINDEX, TROPONINI in the last 168 hours. BNP (last 3 results) No results for input(s): PROBNP in the last 8760 hours. HbA1C: No results for input(s): HGBA1C in the last 72 hours. CBG: Recent Labs  Lab 01/04/21 1623 01/04/21 2101 01/05/21 0737 01/05/21 1150 01/05/21 1604  GLUCAP 271* 155* 179* 243* 169*   Lipid Profile: No results for input(s): CHOL, HDL, LDLCALC, TRIG, CHOLHDL, LDLDIRECT in the last 72 hours. Thyroid Function Tests: No results for input(s): TSH, T4TOTAL, FREET4, T3FREE, THYROIDAB in the last 72 hours. Anemia Panel: No results for input(s): VITAMINB12, FOLATE, FERRITIN, TIBC, IRON, RETICCTPCT in the last 72 hours.  Sepsis Labs: No results for input(s): PROCALCITON, LATICACIDVEN in the last 168 hours.  No results found for this or any previous visit (from the past 240 hour(s)).        Radiology Studies: CT HEAD CODE STROKE WO CONTRAST`  Result Date: 01/05/2021 CLINICAL DATA:  Code stroke.  Neuro deficit, acute stroke suspected. EXAM: CT HEAD WITHOUT CONTRAST TECHNIQUE: Contiguous axial images were obtained from the base of the skull through the vertex without intravenous contrast. COMPARISON:  CT head Dec 14, 2020. FINDINGS: Intermittently motion limited evaluation.  Within this limitation: Brain: No evidence of acute large vascular territory infarction, hemorrhage, extra-axial collection or mass lesion/mass effect. Similar chronic infarcts in the left thalamus, bilateral basal ganglia, and left frontal white matter with ex vacuo ventricular dilation left lateral ventricle. Similar ventriculomegaly. Vascular: No hyperdense vessel identified. Calcific atherosclerosis. Skull: No acute fracture. Sinuses/Orbits: Clear visualized sinuses.  Unremarkable orbits. Other: No mastoid effusions. ASPECTS [facility] Stroke Program Early CT Score) Total score (0-10 with 10 being normal): 10. IMPRESSION: 1. No evidence of acute intracranial abnormality.  ASPECTS is 10. 2. Similar  remote infarcts and left frontal encephalomalacia with ex vacuo ventricular dilation. Code stroke imaging results were communicated on 01/05/2021 at 4:57 pm to provider Dr. Quinn Axe via secure text paging. Electronically Signed   By: Margaretha Sheffield MD   On: 01/05/2021 16:57        Scheduled Meds:  amLODipine  10 mg Oral Daily   aspirin  81 mg Oral Daily   atorvastatin  20 mg Oral QHS   Chlorhexidine Gluconate Cloth  6 each Topical Q0600   cloNIDine  0.3 mg Oral BID   clopidogrel  75 mg Oral Daily   feeding supplement (NEPRO CARB STEADY)  237 mL Oral BID BM   gabapentin  100 mg Oral QHS   heparin  5,000 Units Subcutaneous Q8H   hydrALAZINE  100 mg Oral Q6H   insulin aspart  0-15 Units Subcutaneous TID WC   insulin aspart  3 Units Subcutaneous TID WC   insulin glargine  10 Units Subcutaneous QHS   isosorbide mononitrate  60 mg Oral Daily   levETIRAcetam  500 mg Oral Q24H   LORazepam       metoprolol tartrate  50 mg Oral BID   multivitamin  1 tablet Oral QHS   nystatin  1 application Topical BID   pantoprazole  40 mg Oral Daily   polyethylene glycol  17 g Oral Daily   senna-docusate  2 tablet Oral BID   torsemide  20 mg Oral Daily   vitamin B-12  250 mcg Oral Daily   Continuous Infusions:  sodium chloride     sodium chloride     iron sucrose 100 mg (01/05/21 1526)   sodium chloride       LOS: 20 days     Alma Friendly, MD [facility]  If 7PM-7AM, please contact night-coverage  01/05/2021, 6:41 PM

## 2021-01-06 ENCOUNTER — Encounter: Payer: Self-pay | Admitting: Internal Medicine

## 2021-01-06 LAB — CBC WITH DIFFERENTIAL/PLATELET
Abs Immature Granulocytes: 0.06 10*3/uL (ref 0.00–0.07)
Basophils Absolute: 0 10*3/uL (ref 0.0–0.1)
Basophils Relative: 1 %
Eosinophils Absolute: 0 10*3/uL (ref 0.0–0.5)
Eosinophils Relative: 0 %
HCT: 27.4 % — ABNORMAL LOW (ref 36.0–46.0)
Hemoglobin: 9.1 g/dL — ABNORMAL LOW (ref 12.0–15.0)
Immature Granulocytes: 1 %
Lymphocytes Relative: 15 %
Lymphs Abs: 0.7 10*3/uL (ref 0.7–4.0)
MCH: 29.7 pg (ref 26.0–34.0)
MCHC: 33.2 g/dL (ref 30.0–36.0)
MCV: 89.5 fL (ref 80.0–100.0)
Monocytes Absolute: 0.7 10*3/uL (ref 0.1–1.0)
Monocytes Relative: 16 %
Neutro Abs: 3 10*3/uL (ref 1.7–7.7)
Neutrophils Relative %: 67 %
Platelets: 182 10*3/uL (ref 150–400)
RBC: 3.06 MIL/uL — ABNORMAL LOW (ref 3.87–5.11)
RDW: 14.6 % (ref 11.5–15.5)
WBC: 4.6 10*3/uL (ref 4.0–10.5)
nRBC: 0 % (ref 0.0–0.2)

## 2021-01-06 LAB — URINALYSIS, ROUTINE W REFLEX MICROSCOPIC
Bilirubin Urine: NEGATIVE
Glucose, UA: 50 mg/dL — AB
Ketones, ur: NEGATIVE mg/dL
Nitrite: NEGATIVE
Protein, ur: >=300 mg/dL — AB
Specific Gravity, Urine: 1.011 (ref 1.005–1.030)
WBC, UA: >50 WBC/hpf — ABNORMAL HIGH (ref 0–5)
pH: 7 (ref 5.0–8.0)

## 2021-01-06 LAB — RENAL FUNCTION PANEL
Albumin: 2.9 g/dL — ABNORMAL LOW (ref 3.5–5.0)
Anion gap: 9 (ref 5–15)
BUN: 34 mg/dL — ABNORMAL HIGH (ref 8–23)
CO2: 28 mmol/L (ref 22–32)
Calcium: 8.7 mg/dL — ABNORMAL LOW (ref 8.9–10.3)
Chloride: 104 mmol/L (ref 98–111)
Creatinine, Ser: 2.89 mg/dL — ABNORMAL HIGH (ref 0.44–1.00)
GFR, Estimated: 18 mL/min — ABNORMAL LOW (ref >60)
Glucose, Bld: 93 mg/dL (ref 70–99)
Phosphorus: 3.9 mg/dL (ref 2.5–4.6)
Potassium: 3.1 mmol/L — ABNORMAL LOW (ref 3.5–5.1)
Sodium: 141 mmol/L (ref 135–145)

## 2021-01-06 LAB — GLUCOSE, CAPILLARY
Glucose-Capillary: 119 mg/dL — ABNORMAL HIGH (ref 70–99)
Glucose-Capillary: 203 mg/dL — ABNORMAL HIGH (ref 70–99)
Glucose-Capillary: 254 mg/dL — ABNORMAL HIGH (ref 70–99)
Glucose-Capillary: 129 mg/dL — ABNORMAL HIGH (ref 70–99)

## 2021-01-06 LAB — PROCALCITONIN: Procalcitonin: 1.02 ng/mL

## 2021-01-06 MED ORDER — SODIUM CHLORIDE 0.9 % IV SOLN
1.0000 g | INTRAVENOUS | Status: DC
  Administered 2021-01-06 – 2021-01-10 (×5): 1 g via INTRAVENOUS
  Filled 2021-01-06: qty 1
  Filled 2021-01-06: qty 10
  Filled 2021-01-06: qty 1
  Filled 2021-01-06: qty 10
  Filled 2021-01-06: qty 1
  Filled 2021-01-06: qty 10

## 2021-01-06 MED ORDER — POTASSIUM CHLORIDE 10 MEQ/100ML IV SOLN
10.0000 meq | INTRAVENOUS | Status: AC
  Administered 2021-01-06 (×4): 10 meq via INTRAVENOUS
  Filled 2021-01-06 (×6): qty 100

## 2021-01-06 NOTE — Progress Notes (Signed)
Neurology Progress Note  Patient ID: 65 yo woman with hx multiple prior stroke and chart hx seizure disorder admitted for acute on chronic kidney impairment and UTI. Stroke code was called 6/22 for acute change in mental status from the previous day.  Interval hx: Patient found to have recurrent UTI and started on abx  Subjective This afternoon exam has improved, she is able to tell me that she feels her L sided weakness is at her recent baseline although she does not remember much of yesterday and becomes tearful and confused at times during our discussion  Objective  Vitals:   01/06/21 0814 01/06/21 1059  BP: (!) 185/74 (!) 181/70  Pulse:  86  Resp: 16 18  Temp:  98.9 F (37.2 C)  SpO2:  98%   Gen: lying in bed alert, NAD Resp: normal WOB CV: RRR  Neurologic exam MS: alert, oriented x4, able to identify picture of granddaughter candace at bedside, does not recall yesterday and becomes mildly confused and tearful durinconversation Speech: mild dysarthria, no aphasia CN: PERRL, VFF, EOMI, sensation intact, L UMN facial droop, hearing intact to voice Motor: LUE anti-gravity but no movement (+) contracture distally. Anti-gravity LLE. Drift but able to maintain anti-gravity RUE and RLE Sensory: SILT Reflexes: 2+ throughout more brisk on L, toes up L only Coordination, gait: deferred  A/P: 65 yo woman with hx multiple prior stroke and chart hx seizure disorder admitted for acute on chronic kidney impairment and UTI. Stroke code was called 6/22 for acute change in mental status from the previous day. She has improved after starting abx for recurrent UTI. No indication for EEG at this time. Her mental status change is favored to be due to encephalopathy 2/2 infection which is now clearing. If she did have a seizure yesterday it would have been provoked in the setting of infection and would not warrant a change in her current AED.  No further neurologic workup indicated at this time.   Tx of infection per primary team. Continue keppra current renal dosing (home med)  Neurology will not continue to actively follow. If her encephalopathy does not clear up after infection is treated, or if new neurologic concerns arise, please re-engage.  Su Monks, MD [facility] [phone]  If 7pm- 7am, please page neurology on call as listed in [place].

## 2021-01-06 NOTE — Progress Notes (Signed)
[facility]  ROUNDING NOTE   Subjective:   Patient reluctant to receiving dialysis today Will hold dialysis today Monitor labs and UOP  UOP 1200    Objective:  Vital signs in last 24 hours:  Temp:  [98.5 F (36.9 C)-100.1 F (37.8 C)] 98.9 F (37.2 C) (06/23 1059) Pulse Rate:  [79-95] 86 (06/23 1059) Resp:  [16-24] 18 (06/23 1059) BP: (129-201)/(36-108) 181/70 (06/23 1059) SpO2:  [95 %-98 %] 98 % (06/23 1059) Weight:  [89 kg] 89 kg (06/23 0800)  Weight change:  Filed Weights   01/02/21 0553 01/03/21 0414 01/06/21 0800  Weight: 93 kg 93.1 kg 89 kg    Intake/Output: I/O last 3 completed shifts: In: 360 [P.O.:360] Out: 2300 [Urine:2300]   Intake/Output this shift:  Total I/O In: -  Out: 150 [Urine:150]  Physical Exam: General: Laying in bed  Head: Normocephalic, atraumatic.moist oral mucosal membranes  Eyes: Anicteric  Lungs:  Clear    Heart: regular  Abdomen:  Soft, nontender, non distended  Extremities:  No  peripheral edema.  Neurologic:  Alert to self and place.   Skin: No acute lesions or rashes  Access: RIJ permcath    Basic Metabolic Panel: Recent Labs  Lab 01/03/21 0623 01/04/21 0421 01/05/21 0508 01/05/21 1751 01/06/21 0545  NA 139 137 137 137 141  K 4.2 4.2 3.6 3.6 3.1*  CL 104 104 102 101 104  CO2 27 26 30 25 28   GLUCOSE 205* 237* 152* 184* 93  BUN 43* 50* 26* 28* 34*  CREATININE 2.87* 2.92* 1.99* 2.68* 2.89*  CALCIUM 8.4* 8.6* 8.5* 8.9 8.7*  PHOS 4.2 5.0* 3.3  --  3.9     Liver Function Tests: Recent Labs  Lab 01/03/21 0623 01/04/21 0421 01/05/21 0508 01/05/21 1751 01/06/21 0545  AST  --   --   --  27  --   ALT  --   --   --  10  --   ALKPHOS  --   --   --  69  --   BILITOT  --   --   --  0.6  --   PROT  --   --   --  6.8  --   ALBUMIN 2.6* 2.5* 2.7* 3.2* 2.9*    No results for input(s): LIPASE, AMYLASE in the last 168 hours. No results for input(s): AMMONIA in the last 168 hours.  CBC: Recent Labs   Lab 01/02/21 0453 01/03/21 0623 01/04/21 0421 01/05/21 0508 01/05/21 1751 01/06/21 0545  WBC 5.5 5.5 6.2 6.6 6.8 4.6  NEUTROABS 2.8 3.2 3.9 4.9  --  3.0  HGB 9.0* 8.1* 8.1* 9.4* 9.8* 9.1*  HCT 27.8* 24.5* 24.4* 28.2* 29.3* 27.4*  MCV 87.4 89.7 90.0 88.4 89.1 89.5  PLT 256 198 198 207 216 182     Cardiac Enzymes: No results for input(s): CKTOTAL, CKMB, CKMBINDEX, TROPONINI in the last 168 hours.  BNP: Invalid input(s): POCBNP  CBG: Recent Labs  Lab 01/05/21 0737 01/05/21 1150 01/05/21 1604 01/05/21 2135 01/06/21 0749  GLUCAP 179* 243* 169* 228* 119*     Microbiology: Results for orders placed or performed during the hospital encounter of 12/14/20  SARS CORONAVIRUS 2 (TAT 6-24 HRS) Nasopharyngeal Nasopharyngeal Swab     Status: None   Collection Time: 12/14/20  8:28 PM   Specimen: Nasopharyngeal Swab  Result Value Ref Range Status   SARS Coronavirus 2 NEGATIVE NEGATIVE Final    Comment: (NOTE) SARS-CoV-2 target nucleic acids are NOT  DETECTED.  The SARS-CoV-2 RNA is generally detectable in upper and lower respiratory specimens during the acute phase of infection. Negative results do not preclude SARS-CoV-2 infection, do not rule out co-infections with other pathogens, and should not be used as the sole basis for treatment or other patient management decisions. Negative results must be combined with clinical observations, patient history, and epidemiological information. The expected result is Negative.  Fact Sheet for Patients: SugarRoll.be  Fact Sheet for Healthcare Providers: https://www.woods-mathews.com/  This test is not yet approved or cleared by the [place] FDA and  has been authorized for detection and/or diagnosis of SARS-CoV-2 by FDA under an Emergency Use Authorization (EUA). This EUA will remain  in effect (meaning this test can be used) for the duration of the COVID-19 declaration under Se ction  564(b)(1) of the Act, 21 U.S.C. section 360bbb-3(b)(1), unless the authorization is terminated or revoked sooner.  Performed at [facility], [place] [address] [place], [place] [address]   SARS CORONAVIRUS 2 (TAT 6-24 HRS) Nasopharyngeal Nasopharyngeal Swab     Status: None   Collection Time: 12/19/20  4:40 PM   Specimen: Nasopharyngeal Swab  Result Value Ref Range Status   SARS Coronavirus 2 NEGATIVE NEGATIVE Final    Comment: (NOTE) SARS-CoV-2 target nucleic acids are NOT DETECTED.  The SARS-CoV-2 RNA is generally detectable in upper and lower respiratory specimens during the acute phase of infection. Negative results do not preclude SARS-CoV-2 infection, do not rule out co-infections with other pathogens, and should not be used as the sole basis for treatment or other patient management decisions. Negative results must be combined with clinical observations, patient history, and epidemiological information. The expected result is Negative.  Fact Sheet for Patients: SugarRoll.be  Fact Sheet for Healthcare Providers: https://www.woods-mathews.com/  This test is not yet approved or cleared by the [place] FDA and  has been authorized for detection and/or diagnosis of SARS-CoV-2 by FDA under an Emergency Use Authorization (EUA). This EUA will remain  in effect (meaning this test can be used) for the duration of the COVID-19 declaration under Se ction 564(b)(1) of the Act, 21 U.S.C. section 360bbb-3(b)(1), unless the authorization is terminated or revoked sooner.  Performed at [facility], [place] [address] [place], [place] [address]     Coagulation Studies: No results for input(s): LABPROT, INR in the last 72 hours.  Urinalysis: Recent Labs    01/06/21 0422  COLORURINE YELLOW*  LABSPEC 1.011  PHURINE 7.0  GLUCOSEU 50*  HGBUR SMALL*  BILIRUBINUR NEGATIVE  KETONESUR NEGATIVE  PROTEINUR >=300*  NITRITE  NEGATIVE  LEUKOCYTESUR LARGE*       Imaging: DG Chest Port 1 View  Result Date: 01/05/2021 CLINICAL DATA:  Chest pain altered EXAM: PORTABLE CHEST 1 VIEW COMPARISON:  11/29/2020 FINDINGS: Right-sided central venous catheter with tips projecting over SVC and SVC origin. Linear atelectasis left base. Dense mitral calcification. Cardiomediastinal silhouette within normal limits. Aortic atherosclerosis. IMPRESSION: 1. Subsegmental atelectasis at the left base. 2. Right-sided central venous catheter with tips projecting over the high and mid SVC. Electronically Signed   By: Donavan Foil M.D.   On: 01/05/2021 20:23   CT HEAD CODE STROKE WO CONTRAST`  Result Date: 01/05/2021 CLINICAL DATA:  Code stroke.  Neuro deficit, acute stroke suspected. EXAM: CT HEAD WITHOUT CONTRAST TECHNIQUE: Contiguous axial images were obtained from the base of the skull through the vertex without intravenous contrast. COMPARISON:  CT head Dec 14, 2020. FINDINGS: Intermittently motion limited evaluation.  Within this limitation: Brain: No evidence of acute large vascular territory infarction, hemorrhage, extra-axial collection or mass lesion/mass effect. Similar chronic infarcts in the left thalamus, bilateral basal ganglia, and left frontal white matter with ex vacuo ventricular dilation left lateral ventricle. Similar ventriculomegaly. Vascular: No hyperdense vessel identified. Calcific atherosclerosis. Skull: No acute fracture. Sinuses/Orbits: Clear visualized sinuses.  Unremarkable orbits. Other: No mastoid effusions. ASPECTS [facility] Stroke Program Early CT Score) Total score (0-10 with 10 being normal): 10. IMPRESSION: 1. No evidence of acute intracranial abnormality.  ASPECTS is 10. 2. Similar remote infarcts and left frontal encephalomalacia with ex vacuo ventricular dilation. Code stroke imaging results were communicated on 01/05/2021 at 4:57 pm to provider Dr. Quinn Axe via secure text paging. Electronically Signed   By:  Margaretha Sheffield MD   On: 01/05/2021 16:57     Medications:    sodium chloride     sodium chloride 100 mL (01/06/21 0855)   cefTRIAXone (ROCEPHIN)  IV 1 g (01/06/21 0857)   iron sucrose 100 mg (01/05/21 1526)   potassium chloride 10 mEq (01/06/21 1109)   sodium chloride      amLODipine  10 mg Oral Daily   aspirin  81 mg Oral Daily   atorvastatin  20 mg Oral QHS   Chlorhexidine Gluconate Cloth  6 each Topical Q0600   cloNIDine  0.3 mg Oral BID   clopidogrel  75 mg Oral Daily   feeding supplement (NEPRO CARB STEADY)  237 mL Oral BID BM   gabapentin  100 mg Oral QHS   heparin  5,000 Units Subcutaneous Q8H   hydrALAZINE  100 mg Oral Q6H   insulin aspart  0-15 Units Subcutaneous TID WC   insulin aspart  3 Units Subcutaneous TID WC   insulin glargine  10 Units Subcutaneous QHS   isosorbide mononitrate  60 mg Oral Daily   levETIRAcetam  500 mg Oral Q24H   metoprolol tartrate  50 mg Oral BID   multivitamin  1 tablet Oral QHS   nystatin  1 application Topical BID   pantoprazole  40 mg Oral Daily   polyethylene glycol  17 g Oral Daily   senna-docusate  2 tablet Oral BID   torsemide  20 mg Oral Daily   vitamin B-12  250 mcg Oral Daily   sodium chloride, sodium chloride, acetaminophen **OR** acetaminophen, alteplase, heparin, hydrALAZINE, HYDROcodone-acetaminophen, hydrocortisone, iohexol, lidocaine (PF), lidocaine-prilocaine, ondansetron **OR** ondansetron (ZOFRAN) IV, pentafluoroprop-tetrafluoroeth, senna-docusate  Assessment/ Plan:  [name] is a 65 y.o. white female with diabetes mellitus type II, hypertension, CVA with residual left hemiplegia, hyperlipidemia, congestive heart failure who was admitted to [facility] on 12/14/2020 for AKI (acute kidney injury) ([place]) [N17.9] Altered mental status, unspecified altered mental status type [R41.82] Acute kidney injury superimposed on chronic kidney disease ([place]) [X21.1, H41.7]  Complicated and prolonged hospitalization.  Initiated on hemodialysis on 6/17  Acute Kidney Injury on chronic kidney disease stage IV with baseline creatinine 2.32 and GFR  of 23 on 12/03/20. Nephrotic range proteinuria.  Acute kidney injury secondary to ATN, prerenal azotemia and uncontrolled hypertension Chronic kidney disease is secondary to diabetic nephropathy.  Holding enalapril Nonoliguric urine output.  - Continue to hold dialysis and monitor urine output, volume status, serum electrolytes and renal function.  - Will continue to consider need for dialysis on daily basis - Outpatient planning for [facility]. Patient has not sat in a chair for treatment yet.  -Will order 24 hr urine creatinine clearance   Lab  Results  Component Value Date   CREATININE 2.89 (H) 01/06/2021   CREATININE 2.68 (H) 01/05/2021   CREATININE 1.99 (H) 01/05/2021    Intake/Output Summary (Last 24 hours) at 01/06/2021 1132 Last data filed at 01/06/2021 0727 Gross per 24 hour  Intake --  Output 1350 ml  Net -1350 ml     2. Hypertension: 180s/70s. Volume status at goal.   Current regimen of amlodipine, clonidine, hydralazine, isosorbide mononitrate, metoprolol and torsemide.   3. 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  Hgb A1c 8.6% on 11/29/20  4. Anemia of CKD with iron deficiency.  Lab Results  Component Value Date   HGB 9.1 (L) 01/06/2021  - started on IV iron on 6/19.  -will continue to monitor   LOS: 21   6/23/202211:32 AM

## 2021-01-06 NOTE — Progress Notes (Signed)
Palliative:  HPI: 65 y.o. female  with past medical history of CVA -L weakness, left frontal encephalomalacia, CKD 3-4, IDDM2, HTN/HLD, seizure disorder, and memory loss admitted on 12/14/2020 with acute on chronic kidney failure.  I met today at [place] bedside along with her husband and daughter, [name] is feeling better today. She continues with expressive aphasia which daughter describes as worse than baseline currently. I explained that she is being treated for UTI and there is a question of possible seizure. Either of these medical issues could potentially exacerbate her previous stroke symptoms in the setting of acute illness. Overall she is feeling much better than yesterday and family express that she is better but not to baseline.   We further discussed her wishes for further dialysis if needed. She has had a difficult time with dialysis and is very hopeful that her kidneys will function some on their own and avoid dialysis. She would consider further dialysis but has serious reservations about this being something that she wants to do or even can do (especially as outpatient). She asks about what would happen if she does not do dialysis and we discussed that this depends on how well her kidneys function and how long. She become tearful thinking of this. We discussed the most important thing for her to do is to focus on living well and making the most of each day. We further discussed quality of life and spending time with her family.   All questions/concerns addressed. Emotional support provided.   Exam: Alert, appears mostly oriented. Expressive aphasia. No distress. Pale. Breathing regular, unlabored. Abd flat.   Plan: - They continue to discuss her wishes for dialysis. At this time would be open to further trials if indicated. I encourage them to continue discussions as a family.  - Otherwise they are hopeful for improvement and return back to home. - She would benefit from outpatient  palliative referral.   25 min  Vinie Sill, NP Palliative Medicine Team Pager [phone] (Please see [email or web address] for schedule) Team Phone [phone]    Greater than 50%  of this time was spent counseling and coordinating care related to the above assessment and plan

## 2021-01-06 NOTE — Progress Notes (Addendum)
PROGRESS NOTE    Susan House  WUJ:[id number] DOB: 08-Mar-1956 DOA: 12/14/2020 PCP: Jerrol Banana., MD   Brief Narrative:  65 y.o. female with medical history significant for history of CVA with residual left-sided weakness, left frontal encephalomalacia, CKD stage IIIb-IV, insulin-dependent type 2 diabetes, hypertension, hyperlipidemia, seizure disorder, and memory loss who presents to the ED for evaluation of generalized weakness and abnormal labs, found to have AKI on CKD and evidence of urinary tract infection.  Patient admitted for further management.  Due to worsening kidney function, nephrology consulted, HD was started on 12/31/2020.  Patient still making urine and BP still remains labile.   Assessment & Plan:   Principal Problem:   Acute kidney injury superimposed on chronic kidney disease ([place]) Active Problems:   Hypertension associated with diabetes ([place])   Type 2 diabetes mellitus with complications ([place])   Hemiparesis due to old cerebrovascular accident [facility])   Hyperlipidemia associated with type 2 diabetes mellitus ([place])   Hx of completed stroke   AKI (acute kidney injury) ([place])   Protein-calorie malnutrition, severe  Progression to end-stage kidney disease Started on HD on 6/17, making urine Further HD sessions per nephrology  Renal ultrasound with Doppler rule showed 10.8 x 5.6 cm cystic mass on the right side.  CT abdomen pelvis did not show this mass  Acute metabolic encephalopathy Likely 2/2 ?UTI Vs ??  Postictal state, ??  Seizure Recently completed 3 days of Rocephin for UTI Procalcitonin 1.02, will trend UA with large leukocytes, many bacteria, > 50 WBC  UC and BC X 2 pending CXR with atelectasis, otherwise unremarkable Start IV Rocephin pending urine culture and blood culture Code stroke was activated, CT head negative for any acute intracranial abnormality Neurology consulted, appreciate recs  Hypokalemia Replace as  needed  Refractory/labile hypertension  Continue current regimen of amlodipine, clonidine, hydralazine, metoprolol, imdur, torsemide, pending BP   Anemia of chronic kidney disease and B12 deficiency Hemoglobin stable s/p 1 PRBC  Insulin-dependent type 2 diabetes, uncontrolled A1c 8.6% SSI, Lantus, NovoLog 3 times daily, Accu-Cheks, hypoglycemic protocol   History of CVA with residual left-sided weakness/left frontal encephalomalacia Continue aspirin, Plavix, atorvastatin.   Seizure disorder Continue Keppra Seizure precaution   Memory loss Suspected underlying vascular dementia.   She had some reported confusion/delirium prior to arrival.   Baseline level of mentation is unclear CT head significant for chronic volume loss and small vessel ischemic changes consistent with suspected vascular dementia Outpatient follow-up   Constipation Continue Senokot-S and MiraLAX   DVT prophylaxis: SQ heparin Code Status: DNR Family Communication: None at bedside Disposition Plan: Status is: Inpatient  Remains inpatient appropriate because:Inpatient level of care appropriate due to severity of illness  Dispo: The patient is from: SNF              Anticipated d/c is to: SNF              Patient currently is not medically stable to d/c.   Difficult to place patient No  New start hemodialysis during this admission.  Nephrology following for inpatient hemodialysis needs.  Disposition plan pending.   Level of care: Med-Surg  Consultants:  Nephrology Neurology  Procedures:  Permacath placement  Antimicrobials:  None   Subjective: This morning, patient still encephalopathic/lethargic, not able to engage in any meaningful conversation with me.  Although appears slightly better than yesterday.   Objective: Vitals:   01/06/21 0800 01/06/21 0814 01/06/21 1059 01/06/21 1552  BP:  Marland Kitchen)  185/74 (!) 181/70 (!) 152/51  Pulse:   86 67  Resp: (!) 24 16 18 16   Temp:   98.9 F (37.2 C)  98.5 F (36.9 C)  TempSrc:   Oral   SpO2:   98% 100%  Weight: 89 kg     Height:        Intake/Output Summary (Last 24 hours) at 01/06/2021 1703 Last data filed at 01/06/2021 1416 Gross per 24 hour  Intake 240 ml  Output 650 ml  Net -410 ml   Filed Weights   01/02/21 0553 01/03/21 0414 01/06/21 0800  Weight: 93 kg 93.1 kg 89 kg    Examination: General: NAD, lethargic Cardiovascular: S1, S2 present Respiratory: CTAB Abdomen: Soft, nontender, nondistended, bowel sounds present Musculoskeletal: No bilateral pedal edema noted Skin: Normal Psychiatry: Normal mood Neurology: Residual left-sided weakness, no other new focal neurologic deficits noted     Data Reviewed: I have personally reviewed following labs and imaging studies  CBC: Recent Labs  Lab 01/02/21 0453 01/03/21 0623 01/04/21 0421 01/05/21 0508 01/05/21 1751 01/06/21 0545  WBC 5.5 5.5 6.2 6.6 6.8 4.6  NEUTROABS 2.8 3.2 3.9 4.9  --  3.0  HGB 9.0* 8.1* 8.1* 9.4* 9.8* 9.1*  HCT 27.8* 24.5* 24.4* 28.2* 29.3* 27.4*  MCV 87.4 89.7 90.0 88.4 89.1 89.5  PLT 256 198 198 207 216 341   Basic Metabolic Panel: Recent Labs  Lab 01/03/21 0623 01/04/21 0421 01/05/21 0508 01/05/21 1751 01/06/21 0545  NA 139 137 137 137 141  K 4.2 4.2 3.6 3.6 3.1*  CL 104 104 102 101 104  CO2 27 26 30 25 28   GLUCOSE 205* 237* 152* 184* 93  BUN 43* 50* 26* 28* 34*  CREATININE 2.87* 2.92* 1.99* 2.68* 2.89*  CALCIUM 8.4* 8.6* 8.5* 8.9 8.7*  PHOS 4.2 5.0* 3.3  --  3.9   GFR: Estimated Creatinine Clearance: 20.4 mL/min (A) (by C-G formula based on SCr of 2.89 mg/dL (H)). Liver Function Tests: Recent Labs  Lab 01/03/21 9622 01/04/21 0421 01/05/21 0508 01/05/21 1751 01/06/21 0545  AST  --   --   --  27  --   ALT  --   --   --  10  --   ALKPHOS  --   --   --  69  --   BILITOT  --   --   --  0.6  --   PROT  --   --   --  6.8  --   ALBUMIN 2.6* 2.5* 2.7* 3.2* 2.9*   No results for input(s): LIPASE, AMYLASE in the last 168  hours. No results for input(s): AMMONIA in the last 168 hours. Coagulation Profile: No results for input(s): INR, PROTIME in the last 168 hours. Cardiac Enzymes: No results for input(s): CKTOTAL, CKMB, CKMBINDEX, TROPONINI in the last 168 hours. BNP (last 3 results) No results for input(s): PROBNP in the last 8760 hours. HbA1C: No results for input(s): HGBA1C in the last 72 hours. CBG: Recent Labs  Lab 01/05/21 1150 01/05/21 1604 01/05/21 2135 01/06/21 0749 01/06/21 1145  GLUCAP 243* 169* 228* 119* 203*   Lipid Profile: No results for input(s): CHOL, HDL, LDLCALC, TRIG, CHOLHDL, LDLDIRECT in the last 72 hours. Thyroid Function Tests: No results for input(s): TSH, T4TOTAL, FREET4, T3FREE, THYROIDAB in the last 72 hours. Anemia Panel: No results for input(s): VITAMINB12, FOLATE, FERRITIN, TIBC, IRON, RETICCTPCT in the last 72 hours.  Sepsis Labs: Recent Labs  Lab 01/06/21 0819  PROCALCITON 1.02  No results found for this or any previous visit (from the past 240 hour(s)).        Radiology Studies: DG Chest Port 1 View  Result Date: 01/05/2021 CLINICAL DATA:  Chest pain altered EXAM: PORTABLE CHEST 1 VIEW COMPARISON:  11/29/2020 FINDINGS: Right-sided central venous catheter with tips projecting over SVC and SVC origin. Linear atelectasis left base. Dense mitral calcification. Cardiomediastinal silhouette within normal limits. Aortic atherosclerosis. IMPRESSION: 1. Subsegmental atelectasis at the left base. 2. Right-sided central venous catheter with tips projecting over the high and mid SVC. Electronically Signed   By: Donavan Foil M.D.   On: 01/05/2021 20:23   CT HEAD CODE STROKE WO CONTRAST`  Result Date: 01/05/2021 CLINICAL DATA:  Code stroke.  Neuro deficit, acute stroke suspected. EXAM: CT HEAD WITHOUT CONTRAST TECHNIQUE: Contiguous axial images were obtained from the base of the skull through the vertex without intravenous contrast. COMPARISON:  CT head Dec 14, 2020. FINDINGS: Intermittently motion limited evaluation.  Within this limitation: Brain: No evidence of acute large vascular territory infarction, hemorrhage, extra-axial collection or mass lesion/mass effect. Similar chronic infarcts in the left thalamus, bilateral basal ganglia, and left frontal white matter with ex vacuo ventricular dilation left lateral ventricle. Similar ventriculomegaly. Vascular: No hyperdense vessel identified. Calcific atherosclerosis. Skull: No acute fracture. Sinuses/Orbits: Clear visualized sinuses.  Unremarkable orbits. Other: No mastoid effusions. ASPECTS [facility] Stroke Program Early CT Score) Total score (0-10 with 10 being normal): 10. IMPRESSION: 1. No evidence of acute intracranial abnormality.  ASPECTS is 10. 2. Similar remote infarcts and left frontal encephalomalacia with ex vacuo ventricular dilation. Code stroke imaging results were communicated on 01/05/2021 at 4:57 pm to provider Dr. Quinn Axe via secure text paging. Electronically Signed   By: Margaretha Sheffield MD   On: 01/05/2021 16:57        Scheduled Meds:  amLODipine  10 mg Oral Daily   aspirin  81 mg Oral Daily   atorvastatin  20 mg Oral QHS   Chlorhexidine Gluconate Cloth  6 each Topical Q0600   cloNIDine  0.3 mg Oral BID   clopidogrel  75 mg Oral Daily   feeding supplement (NEPRO CARB STEADY)  237 mL Oral BID BM   gabapentin  100 mg Oral QHS   heparin  5,000 Units Subcutaneous Q8H   hydrALAZINE  100 mg Oral Q6H   insulin aspart  0-15 Units Subcutaneous TID WC   insulin aspart  3 Units Subcutaneous TID WC   insulin glargine  10 Units Subcutaneous QHS   isosorbide mononitrate  60 mg Oral Daily   levETIRAcetam  500 mg Oral Q24H   metoprolol tartrate  50 mg Oral BID   multivitamin  1 tablet Oral QHS   nystatin  1 application Topical BID   pantoprazole  40 mg Oral Daily   polyethylene glycol  17 g Oral Daily   senna-docusate  2 tablet Oral BID   torsemide  20 mg Oral Daily   vitamin B-12  250 mcg  Oral Daily   Continuous Infusions:  sodium chloride     sodium chloride 100 mL (01/06/21 0855)   cefTRIAXone (ROCEPHIN)  IV 1 g (01/06/21 0857)   sodium chloride       LOS: 21 days     Alma Friendly, MD [facility]  If 7PM-7AM, please contact night-coverage  01/06/2021, 5:03 PM

## 2021-01-06 NOTE — Progress Notes (Signed)
PT Cancellation Note  Patient Details Name: Erricka Falkner MRN: [id number] DOB: 06/04/1956   Cancelled Treatment:    Reason Eval/Treat Not Completed: Fatigue/lethargy limiting ability to participate. Noted patient had code stroke called yesterday. Per interdisciplinary rounds, patient continues to be lethargic today and has difficulty following commands, medical work-up ongoing. Will hold PT for today and will follow up tomorrow as appropriate as patient able to participate.   Minna Merritts, PT, MPT  Percell Locus 01/06/2021, 11:10 AM

## 2021-01-06 NOTE — Progress Notes (Signed)
Referral to [facility] in process.

## 2021-01-07 DIAGNOSIS — I639 Cerebral infarction, unspecified: Secondary | ICD-10-CM

## 2021-01-07 LAB — CBC WITH DIFFERENTIAL/PLATELET
Abs Immature Granulocytes: 0.04 10*3/uL (ref 0.00–0.07)
Basophils Absolute: 0 10*3/uL (ref 0.0–0.1)
Basophils Relative: 1 %
Eosinophils Absolute: 0.2 10*3/uL (ref 0.0–0.5)
Eosinophils Relative: 5 %
HCT: 24.6 % — ABNORMAL LOW (ref 36.0–46.0)
Hemoglobin: 8.1 g/dL — ABNORMAL LOW (ref 12.0–15.0)
Immature Granulocytes: 1 %
Lymphocytes Relative: 19 %
Lymphs Abs: 0.9 10*3/uL (ref 0.7–4.0)
MCH: 29.7 pg (ref 26.0–34.0)
MCHC: 32.9 g/dL (ref 30.0–36.0)
MCV: 90.1 fL (ref 80.0–100.0)
Monocytes Absolute: 1 10*3/uL (ref 0.1–1.0)
Monocytes Relative: 21 %
Neutro Abs: 2.4 10*3/uL (ref 1.7–7.7)
Neutrophils Relative %: 53 %
Platelets: 155 10*3/uL (ref 150–400)
RBC: 2.73 MIL/uL — ABNORMAL LOW (ref 3.87–5.11)
RDW: 14.6 % (ref 11.5–15.5)
WBC: 4.5 10*3/uL (ref 4.0–10.5)
nRBC: 0 % (ref 0.0–0.2)

## 2021-01-07 LAB — CREATININE CLEARANCE, URINE, 24 HOUR
Collection Interval-CRCL: 24 hours
Creatinine Clearance: 6 mL/min — ABNORMAL LOW (ref 75–115)
Creatinine, 24H Ur: 320 mg/d — ABNORMAL LOW (ref 600–1800)
Creatinine, Urine: 32 mg/dL
Urine Total Volume-CRCL: 1000 mL

## 2021-01-07 LAB — RENAL FUNCTION PANEL
Albumin: 2.7 g/dL — ABNORMAL LOW (ref 3.5–5.0)
Anion gap: 8 (ref 5–15)
BUN: 40 mg/dL — ABNORMAL HIGH (ref 8–23)
CO2: 26 mmol/L (ref 22–32)
Calcium: 8.4 mg/dL — ABNORMAL LOW (ref 8.9–10.3)
Chloride: 101 mmol/L (ref 98–111)
Creatinine, Ser: 3.51 mg/dL — ABNORMAL HIGH (ref 0.44–1.00)
GFR, Estimated: 14 mL/min — ABNORMAL LOW (ref >60)
Glucose, Bld: 178 mg/dL — ABNORMAL HIGH (ref 70–99)
Phosphorus: 4.9 mg/dL — ABNORMAL HIGH (ref 2.5–4.6)
Potassium: 4.5 mmol/L (ref 3.5–5.1)
Sodium: 135 mmol/L (ref 135–145)

## 2021-01-07 LAB — URINE CULTURE: Culture: 70000 — AB

## 2021-01-07 LAB — GLUCOSE, CAPILLARY
Glucose-Capillary: 181 mg/dL — ABNORMAL HIGH (ref 70–99)
Glucose-Capillary: 305 mg/dL — ABNORMAL HIGH (ref 70–99)
Glucose-Capillary: 230 mg/dL — ABNORMAL HIGH (ref 70–99)
Glucose-Capillary: 141 mg/dL — ABNORMAL HIGH (ref 70–99)

## 2021-01-07 LAB — PROCALCITONIN: Procalcitonin: 1.44 ng/mL

## 2021-01-07 MED ORDER — INSULIN ASPART 100 UNIT/ML IJ SOLN
5.0000 [IU] | Freq: Three times a day (TID) | INTRAMUSCULAR | Status: DC
  Administered 2021-01-07 – 2021-01-19 (×29): 5 [IU] via SUBCUTANEOUS
  Filled 2021-01-07 (×29): qty 1

## 2021-01-07 NOTE — Progress Notes (Signed)
[facility]  ROUNDING NOTE   Subjective:   24 hr urine in progress Denies shortness of breath Denies pain and discomfort Monitor labs and UOP    Objective:  Vital signs in last 24 hours:  Temp:  [98 F (36.7 C)-98.8 F (37.1 C)] 98.1 F (36.7 C) (06/24 0810) Pulse Rate:  [61-76] 61 (06/24 0810) Resp:  [16-20] 20 (06/24 0810) BP: (135-179)/(50-68) 148/68 (06/24 0810) SpO2:  [96 %-100 %] 97 % (06/24 0810)  Weight change:  Filed Weights   01/02/21 0553 01/03/21 0414 01/06/21 0800  Weight: 93 kg 93.1 kg 89 kg    Intake/Output: I/O last 3 completed shifts: In: 760.1 [P.O.:580; I.V.:107.9; IV Piggyback:72.2] Out: 950 [Urine:950]   Intake/Output this shift:  Total I/O In: 240 [P.O.:240] Out: -   Physical Exam: General: Laying in bed, NAD  Head: Normocephalic, atraumatic.moist oral mucosal membranes  Eyes: Anicteric  Lungs:  Clear    Heart: regular  Abdomen:  Soft, nontender, non distended  Extremities:  No  peripheral edema.  Neurologic:  Alert to self and place.   Skin: No acute lesions or rashes  Access: RIJ permcath    Basic Metabolic Panel: Recent Labs  Lab 01/03/21 0623 01/04/21 0421 01/05/21 0508 01/05/21 1751 01/06/21 0545 01/07/21 0651  NA 139 137 137 137 141 135  K 4.2 4.2 3.6 3.6 3.1* 4.5  CL 104 104 102 101 104 101  CO2 27 26 30 25 28 26   GLUCOSE 205* 237* 152* 184* 93 178*  BUN 43* 50* 26* 28* 34* 40*  CREATININE 2.87* 2.92* 1.99* 2.68* 2.89* 3.51*  CALCIUM 8.4* 8.6* 8.5* 8.9 8.7* 8.4*  PHOS 4.2 5.0* 3.3  --  3.9 4.9*     Liver Function Tests: Recent Labs  Lab 01/04/21 0421 01/05/21 0508 01/05/21 1751 01/06/21 0545 01/07/21 0651  AST  --   --  27  --   --   ALT  --   --  10  --   --   ALKPHOS  --   --  69  --   --   BILITOT  --   --  0.6  --   --   PROT  --   --  6.8  --   --   ALBUMIN 2.5* 2.7* 3.2* 2.9* 2.7*    No results for input(s): LIPASE, AMYLASE in the last 168 hours. No results for input(s): AMMONIA  in the last 168 hours.  CBC: Recent Labs  Lab 01/03/21 0623 01/04/21 0421 01/05/21 0508 01/05/21 1751 01/06/21 0545 01/07/21 0651  WBC 5.5 6.2 6.6 6.8 4.6 4.5  NEUTROABS 3.2 3.9 4.9  --  3.0 2.4  HGB 8.1* 8.1* 9.4* 9.8* 9.1* 8.1*  HCT 24.5* 24.4* 28.2* 29.3* 27.4* 24.6*  MCV 89.7 90.0 88.4 89.1 89.5 90.1  PLT 198 198 207 216 182 155     Cardiac Enzymes: No results for input(s): CKTOTAL, CKMB, CKMBINDEX, TROPONINI in the last 168 hours.  BNP: Invalid input(s): POCBNP  CBG: Recent Labs  Lab 01/06/21 1145 01/06/21 1707 01/06/21 2154 01/07/21 0810 01/07/21 1146  GLUCAP 203* 254* 129* 181* 305*     Microbiology: Results for orders placed or performed during the hospital encounter of 12/14/20  SARS CORONAVIRUS 2 (TAT 6-24 HRS) Nasopharyngeal Nasopharyngeal Swab     Status: None   Collection Time: 12/14/20  8:28 PM   Specimen: Nasopharyngeal Swab  Result Value Ref Range Status   SARS Coronavirus 2 NEGATIVE NEGATIVE Final    Comment: (  NOTE) SARS-CoV-2 target nucleic acids are NOT DETECTED.  The SARS-CoV-2 RNA is generally detectable in upper and lower respiratory specimens during the acute phase of infection. Negative results do not preclude SARS-CoV-2 infection, do not rule out co-infections with other pathogens, and should not be used as the sole basis for treatment or other patient management decisions. Negative results must be combined with clinical observations, patient history, and epidemiological information. The expected result is Negative.  Fact Sheet for Patients: SugarRoll.be  Fact Sheet for Healthcare Providers: https://www.woods-mathews.com/  This test is not yet approved or cleared by the [place] FDA and  has been authorized for detection and/or diagnosis of SARS-CoV-2 by FDA under an Emergency Use Authorization (EUA). This EUA will remain  in effect (meaning this test can be used) for the duration  of the COVID-19 declaration under Se ction 564(b)(1) of the Act, 21 U.S.C. section 360bbb-3(b)(1), unless the authorization is terminated or revoked sooner.  Performed at [facility], [place] [address] [place], [place] [address]   SARS CORONAVIRUS 2 (TAT 6-24 HRS) Nasopharyngeal Nasopharyngeal Swab     Status: None   Collection Time: 12/19/20  4:40 PM   Specimen: Nasopharyngeal Swab  Result Value Ref Range Status   SARS Coronavirus 2 NEGATIVE NEGATIVE Final    Comment: (NOTE) SARS-CoV-2 target nucleic acids are NOT DETECTED.  The SARS-CoV-2 RNA is generally detectable in upper and lower respiratory specimens during the acute phase of infection. Negative results do not preclude SARS-CoV-2 infection, do not rule out co-infections with other pathogens, and should not be used as the sole basis for treatment or other patient management decisions. Negative results must be combined with clinical observations, patient history, and epidemiological information. The expected result is Negative.  Fact Sheet for Patients: SugarRoll.be  Fact Sheet for Healthcare Providers: https://www.woods-mathews.com/  This test is not yet approved or cleared by the [place] FDA and  has been authorized for detection and/or diagnosis of SARS-CoV-2 by FDA under an Emergency Use Authorization (EUA). This EUA will remain  in effect (meaning this test can be used) for the duration of the COVID-19 declaration under Se ction 564(b)(1) of the Act, 21 U.S.C. section 360bbb-3(b)(1), unless the authorization is terminated or revoked sooner.  Performed at [facility], [place] [address] [place], [place] [address]   Urine Culture     Status: Abnormal   Collection Time: 01/06/21  4:22 AM   Specimen: Urine, Random  Result Value Ref Range Status   Specimen Description   Final    URINE, RANDOM Performed at [facility], [address],  [place], [place] [address]    Special Requests   Final    NONE Performed at [facility], [address] [place], [place] [address]    Culture (A)  Final    70,000 COLONIES/mL MULTIPLE SPECIES PRESENT, SUGGEST RECOLLECTION   Report Status 01/07/2021 FINAL  Final  CULTURE, BLOOD (ROUTINE X 2) w Reflex to ID Panel     Status: None (Preliminary result)   Collection Time: 01/06/21  8:19 AM   Specimen: BLOOD  Result Value Ref Range Status   Specimen Description BLOOD RIGHT [facility]  Final   Special Requests   Final    BOTTLES DRAWN AEROBIC AND ANAEROBIC Blood Culture adequate volume   Culture   Final    NO GROWTH < 24 HOURS Performed at [facility], [address], [place], [place] [address]    Report Status PENDING  Incomplete  CULTURE, BLOOD (ROUTINE X 2) w Reflex to ID Panel     Status: None (Preliminary result)   Collection Time: 01/06/21  8:21 AM   Specimen: BLOOD  Result Value Ref Range Status   Specimen Description BLOOD RIGHT HAND  Final   Special Requests   Final    BOTTLES DRAWN AEROBIC AND ANAEROBIC Blood Culture adequate volume   Culture   Final    NO GROWTH < 24 HOURS Performed at [facility], [address] [place], [place] [address]    Report Status PENDING  Incomplete    Coagulation Studies: No results for input(s): LABPROT, INR in the last 72 hours.  Urinalysis: Recent Labs    01/06/21 0422  COLORURINE YELLOW*  LABSPEC 1.011  PHURINE 7.0  GLUCOSEU 50*  HGBUR SMALL*  BILIRUBINUR NEGATIVE  KETONESUR NEGATIVE  PROTEINUR >=300*  NITRITE NEGATIVE  LEUKOCYTESUR LARGE*       Imaging: DG Chest Port 1 View  Result Date: 01/05/2021 CLINICAL DATA:  Chest pain altered EXAM: PORTABLE CHEST 1 VIEW COMPARISON:  11/29/2020 FINDINGS: Right-sided central venous catheter with tips projecting over SVC and SVC origin. Linear atelectasis left base. Dense mitral calcification. Cardiomediastinal silhouette within normal limits. Aortic  atherosclerosis. IMPRESSION: 1. Subsegmental atelectasis at the left base. 2. Right-sided central venous catheter with tips projecting over the high and mid SVC. Electronically Signed   By: Donavan Foil M.D.   On: 01/05/2021 20:23   CT HEAD CODE STROKE WO CONTRAST`  Result Date: 01/05/2021 CLINICAL DATA:  Code stroke.  Neuro deficit, acute stroke suspected. EXAM: CT HEAD WITHOUT CONTRAST TECHNIQUE: Contiguous axial images were obtained from the base of the skull through the vertex without intravenous contrast. COMPARISON:  CT head Dec 14, 2020. FINDINGS: Intermittently motion limited evaluation.  Within this limitation: Brain: No evidence of acute large vascular territory infarction, hemorrhage, extra-axial collection or mass lesion/mass effect. Similar chronic infarcts in the left thalamus, bilateral basal ganglia, and left frontal white matter with ex vacuo ventricular dilation left lateral ventricle. Similar ventriculomegaly. Vascular: No hyperdense vessel identified. Calcific atherosclerosis. Skull: No acute fracture. Sinuses/Orbits: Clear visualized sinuses.  Unremarkable orbits. Other: No mastoid effusions. ASPECTS [facility] Stroke Program Early CT Score) Total score (0-10 with 10 being normal): 10. IMPRESSION: 1. No evidence of acute intracranial abnormality.  ASPECTS is 10. 2. Similar remote infarcts and left frontal encephalomalacia with ex vacuo ventricular dilation. Code stroke imaging results were communicated on 01/05/2021 at 4:57 pm to provider Dr. Quinn Axe via secure text paging. Electronically Signed   By: Margaretha Sheffield MD   On: 01/05/2021 16:57     Medications:    sodium chloride     sodium chloride Stopped (01/06/21 1455)   cefTRIAXone (ROCEPHIN)  IV 1 g (01/07/21 0900)   sodium chloride      amLODipine  10 mg Oral Daily   aspirin  81 mg Oral Daily   atorvastatin  20 mg Oral QHS   Chlorhexidine Gluconate Cloth  6 each Topical Q0600   cloNIDine  0.3 mg Oral BID   clopidogrel  75  mg Oral Daily   feeding supplement (NEPRO CARB STEADY)  237 mL Oral BID BM   gabapentin  100 mg Oral QHS   heparin  5,000 Units Subcutaneous Q8H   hydrALAZINE  100 mg Oral Q6H   insulin aspart  0-15 Units Subcutaneous TID WC   insulin aspart  5 Units Subcutaneous TID WC   insulin glargine  10 Units Subcutaneous QHS  isosorbide mononitrate  60 mg Oral Daily   levETIRAcetam  500 mg Oral Q24H   metoprolol tartrate  50 mg Oral BID   multivitamin  1 tablet Oral QHS   nystatin  1 application Topical BID   pantoprazole  40 mg Oral Daily   polyethylene glycol  17 g Oral Daily   senna-docusate  2 tablet Oral BID   torsemide  20 mg Oral Daily   vitamin B-12  250 mcg Oral Daily   sodium chloride, sodium chloride, acetaminophen **OR** acetaminophen, alteplase, heparin, hydrALAZINE, HYDROcodone-acetaminophen, hydrocortisone, iohexol, lidocaine (PF), lidocaine-prilocaine, ondansetron **OR** ondansetron (ZOFRAN) IV, pentafluoroprop-tetrafluoroeth, senna-docusate  Assessment/ Plan:  Ms. Solene Hereford is a 65 y.o. white female with diabetes mellitus type II, hypertension, CVA with residual left hemiplegia, hyperlipidemia, congestive heart failure who was admitted to [facility] on 12/14/2020 for AKI (acute kidney injury) ([place]) [N17.9] Altered mental status, unspecified altered mental status type [R41.82] Acute kidney injury superimposed on chronic kidney disease ([place]) [J19.1, Y78.2]  Complicated and prolonged hospitalization. Initiated on hemodialysis on 6/17  Acute Kidney Injury on chronic kidney disease stage IV with baseline creatinine 2.32 and GFR  of 23 on 12/03/20. Nephrotic range proteinuria.  Acute kidney injury secondary to ATN, prerenal azotemia and uncontrolled hypertension Chronic kidney disease is secondary to diabetic nephropathy.  Holding enalapril Nonoliguric urine output.  - Outpatient planning for [facility]. Patient has not sat in a chair for treatment yet.  -24 hr  urine creatinine clearance in progress until noon today - will plan for dialysis tomorrow, gentle BFR  Lab Results  Component Value Date   CREATININE 3.51 (H) 01/07/2021   CREATININE 2.89 (H) 01/06/2021   CREATININE 2.68 (H) 01/05/2021    Intake/Output Summary (Last 24 hours) at 01/07/2021 1250 Last data filed at 01/07/2021 0900 Gross per 24 hour  Intake 1000.12 ml  Output 300 ml  Net 700.12 ml     2. Hypertension: 140s/60s. Volume status at goal.   Current regimen of amlodipine, clonidine, hydralazine, isosorbide mononitrate, metoprolol and torsemide. Improved control  3. 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  Hgb A1c 8.6% on 11/29/20 Elevated to 300s today Ssi managed by primary team  4. Anemia of CKD with iron deficiency.  Lab Results  Component Value Date   HGB 8.1 (L) 01/07/2021  - given IV iron on 6/19.  -will continue to monitor - Will consider EPO with dialysis treatments   LOS: [place] 6/24/202212:50 PM

## 2021-01-07 NOTE — Progress Notes (Signed)
PT Cancellation Note  Patient Details Name: Susan House MRN: [id number] DOB: May 10, 1956   Cancelled Treatment:    Reason Eval/Treat Not Completed: Other (comment) Attempted to see pt for re-evaluation. Pt received upright in bed with family present, pt consuming & wishing to finish lunch. Will f/u as able & as pt is available.  [name], PT, DPT 01/07/21, 12:05 PM    [name] 01/07/2021, 12:04 PM

## 2021-01-07 NOTE — Progress Notes (Signed)
Inpatient Diabetes Program Recommendations  AACE/ADA: New Consensus Statement on Inpatient Glycemic Control (2015)  Target Ranges:  Prepandial:   less than 140 mg/dL      Peak postprandial:   less than 180 mg/dL (1-2 hours)      Critically ill patients:  140 - 180 mg/dL   Results for Susan House, Susan A "[name]" (MRN [id number]) as of 01/07/2021 12:00  Ref. Range 01/06/2021 07:49 01/06/2021 11:45 01/06/2021 17:07 01/06/2021 21:54  Glucose-Capillary Latest Ref Range: 70 - 99 mg/dL 119 (H)  3 units NOVOLOG @8 :59 203 (H)  8 units NOVOLOG  254 (H)  11 units NOVOLOG @18 :37 129 (H)    10 units LANTUS   Results for Susan House, [name] (MRN [id number]) as of 01/07/2021 12:00  Ref. Range 01/07/2021 08:10 01/07/2021 11:46  Glucose-Capillary Latest Ref Range: 70 - 99 mg/dL 181 (H)  NO Novolog documented as being given 305 (H)     Home DM Meds: Lantus 14 units QHS                             Novolog 0-20 units TID per SSI   Current Orders: Lantus 10 units QHS      Novolog Moderate Correction Scale/ SSI (0-15 units) TID AC       Novolog 3 units TID with meals    MD- Patient having afternoon CBG elevations  Please consider increasing the Novolog Meal Coverage to 5 units TID with meals     --Will follow patient during hospitalization--  Wyn Quaker RN, MSN, CDE Diabetes Coordinator Inpatient Glycemic Control Team Team Pager: [phone] (8a-5p)

## 2021-01-07 NOTE — Evaluation (Signed)
Physical Therapy Re-Evaluation Patient Details Name: Susan House MRN: [id number] DOB: 02-06-1956 Today's Date: 01/07/2021   History of Present Illness  Pt is a 65 y.o. female with medical history significant for history of CVA with residual left-sided weakness, left frontal encephalomalacia, CKD stage IIIb-IV, insulin-dependent type 2 diabetes, hypertension, hyperlipidemia, seizure disorder, and memory loss who presents to the ED for evaluation of generalized weakness and abnormal labs, potential UTI. Patient recently admitted 11/28/2020-12/03/2020 for AKI on CKD, discharged to SNF.  Clinical Impression  Pt seen for PT re-evaluation 2/2 ongoing LOS. Pt received in bed with spouse reporting soiled linens. PT provides assistance for bed mobility (pt requires min/mod assist with instructional cuing for hemi technique) and attempted to assist with sit>stand from EOB with PT blocking L knee to prevent buckling but pt unable to generate enough power to clear buttocks from EOB. Pt returned to bed and linens changed. Pt agreeable to return to sitting EOB but upon so reports feeling chest pain & describes it as "constricting". Pt returned to supine: BP in RUE: 194/81 mmHg (MAP 110), HR 79 bpm, SPO2 100% on room air. Nurse notified & called to room, pt left in care of nurse.   Pt would benefit from STR upon d/c to maximize independence with functional mobility & reduce fall risk, & decrease caregiver burden prior to return home. Pt's POC & PT goals remain appropriate at this time.     Follow Up Recommendations SNF    Equipment Recommendations   (TBD in next venue)    Recommendations for Other Services       Precautions / Restrictions Precautions Precautions: Fall Precaution Comments: L hemi Restrictions Weight Bearing Restrictions: No      Mobility  Bed Mobility Overal bed mobility: Needs Assistance Bed Mobility: Rolling;Sidelying to Sit;Sit to Supine Rolling: Min assist Sidelying to  sit: Min assist;Mod assist (to upright trunk from L sidelying>sitting EOB)   Sit to supine: Min assist        Transfers   Equipment used: None   Sit to Stand: Total assist;From elevated surface         General transfer comment: attempted sit>stand from elevated EOB with PT providing assistance for boosting & blocking L knee to prevent buckling but pt unable to generate enough power to clear buttocks from EOB  Ambulation/Gait                Stairs            Wheelchair Mobility    Modified Rankin (Stroke Patients Only)       Balance Overall balance assessment: Needs assistance Sitting-balance support: Single extremity supported;Feet supported Sitting balance-Leahy Scale: Fair Sitting balance - Comments: close supervision<>min assist static sitting EOB; min cuing for midline orientation vs R lateral lean                                     Pertinent Vitals/Pain Pain Assessment: Faces Faces Pain Scale: Hurts little more Pain Location: HA, chest "constricting" Pain Descriptors / Indicators: Headache Pain Intervention(s): Monitored during session (RN notified)    Home Living Family/patient expects to be discharged to:: Private residence Living Arrangements: Spouse/significant other Available Help at Discharge: Family;Available 24 hours/day Type of Home: House Home Access: Ramped entrance     Home Layout: One level Home Equipment: Bedside commode;Cane - single point;Shower seat - built in;Walker - 4 wheels;Walker - 2 wheels;Wheelchair -  power Additional Comments: PLOF gathered from previous admission. pt unreliable historian    Prior Function Level of Independence: Needs assistance   Gait / Transfers Assistance Needed: Min A with transfers and able to take several small steps with assist and with a RW with LUE platform, no fall history in last 6 months, w/c with ramp for home access  ADL's / Homemaking Assistance Needed: Assist with  ADLs from dtr and sister and husband  Comments: endorses recent falls     Hand Dominance   Dominant Hand: Right    Extremity/Trunk Assessment    Hx of LUE/LLE hemiplegia, pt with LUE wrist & elbow maintained in flexion, able to extend digits with PROM. LLE unable to perform LLE long arc quad sitting EOB.  RLE/RUE generalized weakness.            Communication   Communication: No difficulties  Cognition Arousal/Alertness: Awake/alert Behavior During Therapy: Flat affect Overall Cognitive Status: Difficult to assess                                 General Comments: Answers questions appropriately 60% of the time, repeatedly states "Lord help me" during mobility but does not elaborate despite encouragement/questioning from PT (family member reports pt does this at baseline)      General Comments      Exercises     Assessment/Plan    PT Assessment Patient needs continued PT services  PT Problem List Decreased strength;Decreased activity tolerance;Decreased balance;Decreased mobility;Decreased knowledge of use of DME;Decreased range of motion;Decreased coordination;Obesity;Decreased safety awareness       PT Treatment Interventions DME instruction;Gait training;Functional mobility training;Therapeutic activities;Therapeutic exercise;Balance training;Patient/family education;Neuromuscular re-education;Wheelchair mobility training;Modalities;Cognitive remediation    PT Goals (Current goals can be found in the Care Plan section)  Acute Rehab PT Goals Patient Stated Goal: To get stronger PT Goal Formulation: With patient/family Time For Goal Achievement: 01/11/21 Potential to Achieve Goals: Fair    Frequency Min 2X/week   Barriers to discharge Inaccessible home environment;Decreased caregiver support unsure if family can provide physical assistance at d/c    Co-evaluation               AM-PAC PT "6 Clicks" Mobility  Outcome Measure Help needed  turning from your back to your side while in a flat bed without using bedrails?: A Lot Help needed moving from lying on your back to sitting on the side of a flat bed without using bedrails?: A Lot Help needed moving to and from a bed to a chair (including a wheelchair)?: Total Help needed standing up from a chair using your arms (e.g., wheelchair or bedside chair)?: Total Help needed to walk in hospital room?: Total Help needed climbing 3-5 steps with a railing? : Total 6 Click Score: 8    End of Session Equipment Utilized During Treatment: Gait belt Activity Tolerance: Treatment limited secondary to medical complications (Comment) Patient left: in bed;with call bell/phone within reach;with family/visitor present;with bed alarm set;with nursing/sitter in room Nurse Communication:  (c/o pain during session, vitals) PT Visit Diagnosis: Other abnormalities of gait and mobility (R26.89);Muscle weakness (generalized) (M62.81)    Time: 1607-3710 PT Time Calculation (min) (ACUTE ONLY): 25 min   Charges:   PT Evaluation $PT Re-evaluation: 1 Re-eval PT Treatments $Therapeutic Activity: 8-22 mins        [name], PT, DPT 01/07/21, 2:28 PM   [name] 01/07/2021, 2:27 PM

## 2021-01-07 NOTE — Progress Notes (Signed)
PROGRESS NOTE    Susan House  FHL:[id number] DOB: August 10, 1955 DOA: 12/14/2020 PCP: Jerrol Banana., MD   Brief Narrative:  65 y.o. female with medical history significant for history of CVA with residual left-sided weakness, left frontal encephalomalacia, CKD stage IIIb-IV, insulin-dependent type 2 diabetes, hypertension, hyperlipidemia, seizure disorder, and memory loss who presents to the ED for evaluation of generalized weakness and abnormal labs, found to have AKI on CKD and evidence of urinary tract infection.  Patient admitted for further management.  Due to worsening kidney function, nephrology consulted, HD was started on 12/31/2020.  Patient still making urine and BP still remains labile.   Assessment & Plan:   Principal Problem:   Acute kidney injury superimposed on chronic kidney disease ([place]) Active Problems:   Hypertension associated with diabetes ([place])   Type 2 diabetes mellitus with complications ([place])   Hemiparesis due to old cerebrovascular accident [facility])   Hyperlipidemia associated with type 2 diabetes mellitus ([place])   Hx of completed stroke   AKI (acute kidney injury) ([place])   Protein-calorie malnutrition, severe  Progression to end-stage kidney disease Started on HD on 6/17, making urine Further HD sessions per nephrology  Renal ultrasound with Doppler rule showed 10.8 x 5.6 cm cystic mass on the right side.  CT abdomen pelvis did not show this mass  Acute metabolic encephalopathy Likely 2/2 ?UTI Vs ??  Postictal state, ??  Seizure Recently completed 3 days of Rocephin for UTI Procalcitonin 1.02-->1.44, will trend UA with large leukocytes, many bacteria, > 50 WBC  UC with multiple species BC X 2 NGTD CXR with atelectasis, otherwise unremarkable Continue IV Rocephin Code stroke was activated, CT head negative for any acute intracranial abnormality Neurology consulted, appreciate recs, no further recs  Hypokalemia Replace as  needed  Refractory/labile hypertension  Continue current regimen of amlodipine, clonidine, hydralazine, metoprolol, imdur, torsemide, pending BP   Anemia of chronic kidney disease and B12 deficiency Hemoglobin stable s/p 1 PRBC  Insulin-dependent type 2 diabetes, uncontrolled A1c 8.6% SSI, Lantus, NovoLog 3 times daily, Accu-Cheks, hypoglycemic protocol   History of CVA with residual left-sided weakness/left frontal encephalomalacia Continue aspirin, Plavix, atorvastatin.   Seizure disorder Continue Keppra Seizure precaution   Memory loss Suspected underlying vascular dementia.   She had some reported confusion/delirium prior to arrival.   Baseline level of mentation is unclear CT head significant for chronic volume loss and small vessel ischemic changes consistent with suspected vascular dementia Outpatient follow-up   Constipation Continue Senokot-S and MiraLAX   DVT prophylaxis: SQ heparin Code Status: DNR Family Communication: None at bedside Disposition Plan: Status is: Inpatient  Remains inpatient appropriate because:Inpatient level of care appropriate due to severity of illness  Dispo: The patient is from: SNF              Anticipated d/c is to: SNF              Patient currently is not medically stable to d/c.   Difficult to place patient No  New start hemodialysis during this admission.  Nephrology following for inpatient hemodialysis needs.  Disposition plan pending.   Level of care: Med-Surg  Consultants:  Nephrology Neurology  Procedures:  Permacath placement  Antimicrobials:  Rocephin    Subjective: Today, patient reports some improvement overall, denies any chest pain, abdominal pain, nausea/vomiting, fever/chills.  Patient stated that she is considering stopping dialysis as it does not make her feel good.  Encouraged her to take some time to think  about it before making a final decision.  Explained the need for  hemodialysis.    Objective: Vitals:   01/07/21 0421 01/07/21 0810 01/07/21 1414 01/07/21 1435  BP: (!) 135/50 (!) 148/68 (!) 194/81 (!) 165/53  Pulse: 62 61 79 74  Resp: 18 20    Temp: 98.1 F (36.7 C) 98.1 F (36.7 C)    TempSrc: Oral     SpO2: 97% 97% 100%   Weight:      Height:        Intake/Output Summary (Last 24 hours) at 01/07/2021 1726 Last data filed at 01/07/2021 1720 Gross per 24 hour  Intake 1100.12 ml  Output 300 ml  Net 800.12 ml   Filed Weights   01/02/21 0553 01/03/21 0414 01/06/21 0800  Weight: 93 kg 93.1 kg 89 kg    Examination: General: NAD, chronically ill-appearing Cardiovascular: S1, S2 present Respiratory: Breath sounds bilaterally Abdomen: Soft, nontender, nondistended, bowel sounds present Musculoskeletal: No bilateral pedal edema noted Skin: Normal Psychiatry: Normal mood Neurology: Residual left-sided weakness, no other new focal neurologic deficits noted     Data Reviewed: I have personally reviewed following labs and imaging studies  CBC: Recent Labs  Lab 01/03/21 0623 01/04/21 0421 01/05/21 0508 01/05/21 1751 01/06/21 0545 01/07/21 0651  WBC 5.5 6.2 6.6 6.8 4.6 4.5  NEUTROABS 3.2 3.9 4.9  --  3.0 2.4  HGB 8.1* 8.1* 9.4* 9.8* 9.1* 8.1*  HCT 24.5* 24.4* 28.2* 29.3* 27.4* 24.6*  MCV 89.7 90.0 88.4 89.1 89.5 90.1  PLT 198 198 207 216 182 836   Basic Metabolic Panel: Recent Labs  Lab 01/03/21 0623 01/04/21 0421 01/05/21 0508 01/05/21 1751 01/06/21 0545 01/07/21 0651  NA 139 137 137 137 141 135  K 4.2 4.2 3.6 3.6 3.1* 4.5  CL 104 104 102 101 104 101  CO2 27 26 30 25 28 26   GLUCOSE 205* 237* 152* 184* 93 178*  BUN 43* 50* 26* 28* 34* 40*  CREATININE 2.87* 2.92* 1.99* 2.68* 2.89* 3.51*  CALCIUM 8.4* 8.6* 8.5* 8.9 8.7* 8.4*  PHOS 4.2 5.0* 3.3  --  3.9 4.9*   GFR: Estimated Creatinine Clearance: 16.8 mL/min (A) (by C-G formula based on SCr of 3.51 mg/dL (H)). Liver Function Tests: Recent Labs  Lab 01/04/21 0421  01/05/21 0508 01/05/21 1751 01/06/21 0545 01/07/21 0651  AST  --   --  27  --   --   ALT  --   --  10  --   --   ALKPHOS  --   --  69  --   --   BILITOT  --   --  0.6  --   --   PROT  --   --  6.8  --   --   ALBUMIN 2.5* 2.7* 3.2* 2.9* 2.7*   No results for input(s): LIPASE, AMYLASE in the last 168 hours. No results for input(s): AMMONIA in the last 168 hours. Coagulation Profile: No results for input(s): INR, PROTIME in the last 168 hours. Cardiac Enzymes: No results for input(s): CKTOTAL, CKMB, CKMBINDEX, TROPONINI in the last 168 hours. BNP (last 3 results) No results for input(s): PROBNP in the last 8760 hours. HbA1C: No results for input(s): HGBA1C in the last 72 hours. CBG: Recent Labs  Lab 01/06/21 1707 01/06/21 2154 01/07/21 0810 01/07/21 1146 01/07/21 1633  GLUCAP 254* 129* 181* 305* 230*   Lipid Profile: No results for input(s): CHOL, HDL, LDLCALC, TRIG, CHOLHDL, LDLDIRECT in the last 72 hours. Thyroid  Function Tests: No results for input(s): TSH, T4TOTAL, FREET4, T3FREE, THYROIDAB in the last 72 hours. Anemia Panel: No results for input(s): VITAMINB12, FOLATE, FERRITIN, TIBC, IRON, RETICCTPCT in the last 72 hours.  Sepsis Labs: Recent Labs  Lab 01/06/21 0819 01/07/21 0651  PROCALCITON 1.02 1.44    Recent Results (from the past 240 hour(s))  Urine Culture     Status: Abnormal   Collection Time: 01/06/21  4:22 AM   Specimen: Urine, Random  Result Value Ref Range Status   Specimen Description   Final    URINE, RANDOM Performed at [facility], [address], [place], [place] [address]    Special Requests   Final    NONE Performed at [facility], [address] [place], [place] [address]    Culture (A)  Final    70,000 COLONIES/mL MULTIPLE SPECIES PRESENT, SUGGEST RECOLLECTION   Report Status 01/07/2021 FINAL  Final  CULTURE, BLOOD (ROUTINE X 2) w Reflex to ID Panel     Status: None (Preliminary result)   Collection Time:  01/06/21  8:19 AM   Specimen: BLOOD  Result Value Ref Range Status   Specimen Description BLOOD RIGHT [facility]  Final   Special Requests   Final    BOTTLES DRAWN AEROBIC AND ANAEROBIC Blood Culture adequate volume   Culture   Final    NO GROWTH < 24 HOURS Performed at [facility], [address], [place], [place] [address]    Report Status PENDING  Incomplete  CULTURE, BLOOD (ROUTINE X 2) w Reflex to ID Panel     Status: None (Preliminary result)   Collection Time: 01/06/21  8:21 AM   Specimen: BLOOD  Result Value Ref Range Status   Specimen Description BLOOD RIGHT HAND  Final   Special Requests   Final    BOTTLES DRAWN AEROBIC AND ANAEROBIC Blood Culture adequate volume   Culture   Final    NO GROWTH < 24 HOURS Performed at [facility], [address] [place], [place] [address]    Report Status PENDING  Incomplete          Radiology Studies: DG Chest Port 1 View  Result Date: 01/05/2021 CLINICAL DATA:  Chest pain altered EXAM: PORTABLE CHEST 1 VIEW COMPARISON:  11/29/2020 FINDINGS: Right-sided central venous catheter with tips projecting over SVC and SVC origin. Linear atelectasis left base. Dense mitral calcification. Cardiomediastinal silhouette within normal limits. Aortic atherosclerosis. IMPRESSION: 1. Subsegmental atelectasis at the left base. 2. Right-sided central venous catheter with tips projecting over the high and mid SVC. Electronically Signed   By: Donavan Foil M.D.   On: 01/05/2021 20:23        Scheduled Meds:  amLODipine  10 mg Oral Daily   aspirin  81 mg Oral Daily   atorvastatin  20 mg Oral QHS   Chlorhexidine Gluconate Cloth  6 each Topical Q0600   cloNIDine  0.3 mg Oral BID   clopidogrel  75 mg Oral Daily   feeding supplement (NEPRO CARB STEADY)  237 mL Oral BID BM   gabapentin  100 mg Oral QHS   heparin  5,000 Units Subcutaneous Q8H   hydrALAZINE  100 mg Oral Q6H   insulin aspart  0-15 Units Subcutaneous TID WC   insulin  aspart  5 Units Subcutaneous TID WC   insulin glargine  10 Units Subcutaneous QHS   isosorbide mononitrate  60 mg Oral Daily   levETIRAcetam  500 mg Oral Q24H   metoprolol  tartrate  50 mg Oral BID   multivitamin  1 tablet Oral QHS   nystatin  1 application Topical BID   pantoprazole  40 mg Oral Daily   polyethylene glycol  17 g Oral Daily   senna-docusate  2 tablet Oral BID   torsemide  20 mg Oral Daily   vitamin B-12  250 mcg Oral Daily   Continuous Infusions:  sodium chloride     sodium chloride Stopped (01/06/21 1455)   cefTRIAXone (ROCEPHIN)  IV Stopped (01/07/21 7342)   sodium chloride       LOS: 22 days     Alma Friendly, MD [facility]  If 7PM-7AM, please contact night-coverage  01/07/2021, 5:26 PM

## 2021-01-07 NOTE — Progress Notes (Signed)
Patient accepted at [facility] 10:00am. Can start 6/28 at 9:45am. Called to inform daughter, left VM, will follow up.

## 2021-01-07 NOTE — Progress Notes (Signed)
OT Cancellation Note  Patient Details Name: Susan House MRN: [id number] DOB: August 07, 1955   Cancelled Treatment:    Reason Eval/Treat Not Completed: Patient at procedure or test/ unavailable. Upon arrival to room, pt with provider at bedside for EKG testing. OT to re-attempt at later time/date as able.  Fredirick Maudlin, OTR/L [place]

## 2021-01-08 LAB — RENAL FUNCTION PANEL
Albumin: 2.4 g/dL — ABNORMAL LOW (ref 3.5–5.0)
Anion gap: 9 (ref 5–15)
BUN: 52 mg/dL — ABNORMAL HIGH (ref 8–23)
CO2: 25 mmol/L (ref 22–32)
Calcium: 8.1 mg/dL — ABNORMAL LOW (ref 8.9–10.3)
Chloride: 100 mmol/L (ref 98–111)
Creatinine, Ser: 3.75 mg/dL — ABNORMAL HIGH (ref 0.44–1.00)
GFR, Estimated: 13 mL/min — ABNORMAL LOW (ref >60)
Glucose, Bld: 199 mg/dL — ABNORMAL HIGH (ref 70–99)
Phosphorus: 5.1 mg/dL — ABNORMAL HIGH (ref 2.5–4.6)
Potassium: 4 mmol/L (ref 3.5–5.1)
Sodium: 134 mmol/L — ABNORMAL LOW (ref 135–145)

## 2021-01-08 LAB — CBC WITH DIFFERENTIAL/PLATELET
Abs Immature Granulocytes: 0.04 10*3/uL (ref 0.00–0.07)
Basophils Absolute: 0 10*3/uL (ref 0.0–0.1)
Basophils Relative: 1 %
Eosinophils Absolute: 0.4 10*3/uL (ref 0.0–0.5)
Eosinophils Relative: 9 %
HCT: 24.6 % — ABNORMAL LOW (ref 36.0–46.0)
Hemoglobin: 8.1 g/dL — ABNORMAL LOW (ref 12.0–15.0)
Immature Granulocytes: 1 %
Lymphocytes Relative: 24 %
Lymphs Abs: 1.2 10*3/uL (ref 0.7–4.0)
MCH: 29.5 pg (ref 26.0–34.0)
MCHC: 32.9 g/dL (ref 30.0–36.0)
MCV: 89.5 fL (ref 80.0–100.0)
Monocytes Absolute: 0.9 10*3/uL (ref 0.1–1.0)
Monocytes Relative: 18 %
Neutro Abs: 2.3 10*3/uL (ref 1.7–7.7)
Neutrophils Relative %: 47 %
Platelets: 176 10*3/uL (ref 150–400)
RBC: 2.75 MIL/uL — ABNORMAL LOW (ref 3.87–5.11)
RDW: 14.6 % (ref 11.5–15.5)
WBC: 4.9 10*3/uL (ref 4.0–10.5)
nRBC: 0 % (ref 0.0–0.2)

## 2021-01-08 LAB — GLUCOSE, CAPILLARY
Glucose-Capillary: 185 mg/dL — ABNORMAL HIGH (ref 70–99)
Glucose-Capillary: 161 mg/dL — ABNORMAL HIGH (ref 70–99)
Glucose-Capillary: 250 mg/dL — ABNORMAL HIGH (ref 70–99)
Glucose-Capillary: 318 mg/dL — ABNORMAL HIGH (ref 70–99)

## 2021-01-08 LAB — PROCALCITONIN: Procalcitonin: 1.27 ng/mL

## 2021-01-08 MED ORDER — INSULIN ASPART 100 UNIT/ML IJ SOLN
0.0000 [IU] | Freq: Three times a day (TID) | INTRAMUSCULAR | Status: DC
  Administered 2021-01-08: 11 [IU] via SUBCUTANEOUS
  Administered 2021-01-09 (×2): 3 [IU] via SUBCUTANEOUS
  Administered 2021-01-09 – 2021-01-11 (×3): 5 [IU] via SUBCUTANEOUS
  Administered 2021-01-11: 2 [IU] via SUBCUTANEOUS
  Administered 2021-01-11: 3 [IU] via SUBCUTANEOUS
  Administered 2021-01-12: 2 [IU] via SUBCUTANEOUS
  Administered 2021-01-12 – 2021-01-13 (×2): 5 [IU] via SUBCUTANEOUS
  Administered 2021-01-13: 8 [IU] via SUBCUTANEOUS
  Administered 2021-01-14: 5 [IU] via SUBCUTANEOUS
  Administered 2021-01-14 (×2): 3 [IU] via SUBCUTANEOUS
  Administered 2021-01-15 (×2): 2 [IU] via SUBCUTANEOUS
  Administered 2021-01-15: 8 [IU] via SUBCUTANEOUS
  Administered 2021-01-16: 5 [IU] via SUBCUTANEOUS
  Administered 2021-01-16: 3 [IU] via SUBCUTANEOUS
  Administered 2021-01-17: 8 [IU] via SUBCUTANEOUS
  Administered 2021-01-17: 5 [IU] via SUBCUTANEOUS
  Administered 2021-01-17 – 2021-01-19 (×5): 3 [IU] via SUBCUTANEOUS
  Administered 2021-01-19: 8 [IU] via SUBCUTANEOUS
  Filled 2021-01-08 (×28): qty 1

## 2021-01-08 MED ORDER — LEVETIRACETAM 250 MG PO TABS
250.0000 mg | ORAL_TABLET | Freq: Once | ORAL | Status: AC
  Administered 2021-01-08: 250 mg via ORAL
  Filled 2021-01-08: qty 1

## 2021-01-08 NOTE — Progress Notes (Addendum)
[facility]  Pt completed full tx, uneventful. All blood returned.  [place]

## 2021-01-08 NOTE — Progress Notes (Signed)
PROGRESS NOTE    Susan House  VVZ:[id number] DOB: 1956/07/10 DOA: 12/14/2020 PCP: Jerrol Banana., MD   Brief Narrative:  66 y.o. female with medical history significant for history of CVA with residual left-sided weakness, left frontal encephalomalacia, CKD stage IIIb-IV, insulin-dependent type 2 diabetes, hypertension, hyperlipidemia, seizure disorder, and memory loss who presents to the ED for evaluation of generalized weakness and abnormal labs, found to have AKI on CKD and evidence of urinary tract infection.  Patient admitted for further management.  Due to worsening kidney function, nephrology consulted, HD was started on 12/31/2020.  Patient still making urine and BP still remains labile.   Assessment & Plan:   Principal Problem:   Acute kidney injury superimposed on chronic kidney disease ([place]) Active Problems:   Hypertension associated with diabetes ([place])   Type 2 diabetes mellitus with complications ([place])   Hemiparesis due to old cerebrovascular accident [facility])   Hyperlipidemia associated with type 2 diabetes mellitus ([place])   Hx of completed stroke   AKI (acute kidney injury) ([place])   Protein-calorie malnutrition, severe  Progression to end-stage kidney disease Started on HD on 6/17, making urine Further HD sessions per nephrology  Renal ultrasound with Doppler rule showed 10.8 x 5.6 cm cystic mass on the right side.  CT abdomen pelvis did not show this mass  Acute metabolic encephalopathy Improved Likely 2/2 ?UTI Vs ??  Postictal state, ??  Seizure Recently completed 3 days of Rocephin for UTI Procalcitonin 1.02-->1.44, will trend UA with large leukocytes, many bacteria, > 50 WBC  UC with multiple species BC X 2 NGTD CXR with atelectasis, otherwise unremarkable Continue IV Rocephin Code stroke was activated, CT head negative for any acute intracranial abnormality Neurology consulted, appreciate recs, no further recs  Hypokalemia Replace as  needed  Refractory/labile hypertension  Continue current regimen of amlodipine, clonidine, hydralazine, metoprolol, imdur, torsemide, pending BP   Anemia of chronic kidney disease and B12 deficiency Hemoglobin stable s/p 1 PRBC  Insulin-dependent type 2 diabetes, uncontrolled A1c 8.6% SSI, Lantus, NovoLog 3 times daily, Accu-Cheks, hypoglycemic protocol   History of CVA with residual left-sided weakness/left frontal encephalomalacia Continue aspirin, Plavix, atorvastatin.   Seizure disorder Continue Keppra Seizure precaution   Memory loss Suspected underlying vascular dementia.   She had some reported confusion/delirium prior to arrival.   Baseline level of mentation is unclear CT head significant for chronic volume loss and small vessel ischemic changes consistent with suspected vascular dementia Outpatient follow-up   Constipation Continue Senokot-S and MiraLAX   DVT prophylaxis: SQ heparin Code Status: DNR Family Communication: None at bedside Disposition Plan: Status is: Inpatient  Remains inpatient appropriate because:Inpatient level of care appropriate due to severity of illness  Dispo: The patient is from: SNF              Anticipated d/c is to: SNF              Patient currently is not medically stable to d/c.   Difficult to place patient No  New start hemodialysis during this admission.  Nephrology following for inpatient hemodialysis needs.  Disposition plan pending.   Level of care: Med-Surg  Consultants:  Nephrology Neurology  Procedures:  Permacath placement  Antimicrobials:  Rocephin    Subjective: Today, patient denies any new complaints.  Patient was not placed on a chair for dialysis (has to be done to assess if patient can tolerate HD as an outpatient).  More alert/awake today.  Oriented to place and  time.    Objective: Vitals:   01/08/21 1230 01/08/21 1240 01/08/21 1300 01/08/21 1646  BP: (!) 149/61 (!) 165/58 (!) 151/48 (!) 126/52   Pulse: 85 66 72 69  Resp: 17 13 15 16   Temp:   98.8 F (37.1 C) 98.2 F (36.8 C)  TempSrc:   Oral Oral  SpO2: 99% 99% 97% 99%  Weight:      Height:        Intake/Output Summary (Last 24 hours) at 01/08/2021 1714 Last data filed at 01/08/2021 1342 Gross per 24 hour  Intake 817 ml  Output 950 ml  Net -133 ml   Filed Weights   01/02/21 0553 01/03/21 0414 01/06/21 0800  Weight: 93 kg 93.1 kg 89 kg    Examination: General: NAD, chronically ill-appearing Cardiovascular: S1, S2 present Respiratory: Diminished breath sounds bilaterally Abdomen: Soft, nontender, nondistended, bowel sounds present Musculoskeletal: No bilateral pedal edema noted Skin: Normal Psychiatry: Normal mood Neurology: Residual left-sided weakness, no other new focal neurologic deficits noted     Data Reviewed: I have personally reviewed following labs and imaging studies  CBC: Recent Labs  Lab 01/04/21 0421 01/05/21 0508 01/05/21 1751 01/06/21 0545 01/07/21 0651 01/08/21 0534  WBC 6.2 6.6 6.8 4.6 4.5 4.9  NEUTROABS 3.9 4.9  --  3.0 2.4 2.3  HGB 8.1* 9.4* 9.8* 9.1* 8.1* 8.1*  HCT 24.4* 28.2* 29.3* 27.4* 24.6* 24.6*  MCV 90.0 88.4 89.1 89.5 90.1 89.5  PLT 198 207 216 182 155 622   Basic Metabolic Panel: Recent Labs  Lab 01/04/21 0421 01/05/21 0508 01/05/21 1751 01/06/21 0545 01/07/21 0651 01/08/21 0534  NA 137 137 137 141 135 134*  K 4.2 3.6 3.6 3.1* 4.5 4.0  CL 104 102 101 104 101 100  CO2 26 30 25 28 26 25   GLUCOSE 237* 152* 184* 93 178* 199*  BUN 50* 26* 28* 34* 40* 52*  CREATININE 2.92* 1.99* 2.68* 2.89* 3.51* 3.75*  CALCIUM 8.6* 8.5* 8.9 8.7* 8.4* 8.1*  PHOS 5.0* 3.3  --  3.9 4.9* 5.1*   GFR: Estimated Creatinine Clearance: 15.7 mL/min (A) (by C-G formula based on SCr of 3.75 mg/dL (H)). Liver Function Tests: Recent Labs  Lab 01/05/21 0508 01/05/21 1751 01/06/21 0545 01/07/21 0651 01/08/21 0534  AST  --  27  --   --   --   ALT  --  10  --   --   --   ALKPHOS  --  69   --   --   --   BILITOT  --  0.6  --   --   --   PROT  --  6.8  --   --   --   ALBUMIN 2.7* 3.2* 2.9* 2.7* 2.4*   No results for input(s): LIPASE, AMYLASE in the last 168 hours. No results for input(s): AMMONIA in the last 168 hours. Coagulation Profile: No results for input(s): INR, PROTIME in the last 168 hours. Cardiac Enzymes: No results for input(s): CKTOTAL, CKMB, CKMBINDEX, TROPONINI in the last 168 hours. BNP (last 3 results) No results for input(s): PROBNP in the last 8760 hours. HbA1C: No results for input(s): HGBA1C in the last 72 hours. CBG: Recent Labs  Lab 01/07/21 1633 01/07/21 2041 01/08/21 0809 01/08/21 1334 01/08/21 1646  GLUCAP 230* 141* 185* 161* 250*   Lipid Profile: No results for input(s): CHOL, HDL, LDLCALC, TRIG, CHOLHDL, LDLDIRECT in the last 72 hours. Thyroid Function Tests: No results for input(s): TSH, T4TOTAL, FREET4, T3FREE, THYROIDAB  in the last 72 hours. Anemia Panel: No results for input(s): VITAMINB12, FOLATE, FERRITIN, TIBC, IRON, RETICCTPCT in the last 72 hours.  Sepsis Labs: Recent Labs  Lab 01/06/21 0819 01/07/21 0651 01/08/21 0534  PROCALCITON 1.02 1.44 1.27    Recent Results (from the past 240 hour(s))  Urine Culture     Status: Abnormal   Collection Time: 01/06/21  4:22 AM   Specimen: Urine, Random  Result Value Ref Range Status   Specimen Description   Final    URINE, RANDOM Performed at [facility], [address], [place], [place] [address]    Special Requests   Final    NONE Performed at [facility], [place] [place], [place] [address]    Culture (A)  Final    70,000 COLONIES/mL MULTIPLE SPECIES PRESENT, SUGGEST RECOLLECTION   Report Status 01/07/2021 FINAL  Final  CULTURE, BLOOD (ROUTINE X 2) w Reflex to ID Panel     Status: None (Preliminary result)   Collection Time: 01/06/21  8:19 AM   Specimen: BLOOD  Result Value Ref Range Status   Specimen Description BLOOD RIGHT [facility]  Final    Special Requests   Final    BOTTLES DRAWN AEROBIC AND ANAEROBIC Blood Culture adequate volume   Culture   Final    NO GROWTH 2 DAYS Performed at [facility], [address], [place], [place] [address]    Report Status PENDING  Incomplete  CULTURE, BLOOD (ROUTINE X 2) w Reflex to ID Panel     Status: None (Preliminary result)   Collection Time: 01/06/21  8:21 AM   Specimen: BLOOD  Result Value Ref Range Status   Specimen Description BLOOD RIGHT HAND  Final   Special Requests   Final    BOTTLES DRAWN AEROBIC AND ANAEROBIC Blood Culture adequate volume   Culture   Final    NO GROWTH 2 DAYS Performed at [facility], [address] [place], [place] [address]    Report Status PENDING  Incomplete          Radiology Studies: No results found.      Scheduled Meds:  amLODipine  10 mg Oral Daily   aspirin  81 mg Oral Daily   atorvastatin  20 mg Oral QHS   Chlorhexidine Gluconate Cloth  6 each Topical Q0600   cloNIDine  0.3 mg Oral BID   clopidogrel  75 mg Oral Daily   feeding supplement (NEPRO CARB STEADY)  237 mL Oral BID BM   gabapentin  100 mg Oral QHS   heparin  5,000 Units Subcutaneous Q8H   hydrALAZINE  100 mg Oral Q6H   insulin aspart  0-15 Units Subcutaneous TID WC   insulin aspart  5 Units Subcutaneous TID WC   insulin glargine  10 Units Subcutaneous QHS   isosorbide mononitrate  60 mg Oral Daily   levETIRAcetam  500 mg Oral Q24H   metoprolol tartrate  50 mg Oral BID   multivitamin  1 tablet Oral QHS   nystatin  1 application Topical BID   pantoprazole  40 mg Oral Daily   polyethylene glycol  17 g Oral Daily   senna-docusate  2 tablet Oral BID   torsemide  20 mg Oral Daily   vitamin B-12  250 mcg Oral Daily   Continuous Infusions:  sodium chloride     sodium chloride Stopped (01/06/21 1455)   cefTRIAXone (ROCEPHIN)  IV 1 g (01/08/21 0840)   sodium chloride  LOS: 23 days     Alma Friendly, MD [facility]  If  7PM-7AM, please contact night-coverage  01/08/2021, 5:14 PM

## 2021-01-08 NOTE — Progress Notes (Signed)
Patient was brought back to her room from dialysis at approximately 1300. Vitals were assessed and the patient was given her lunch tray at 1330. Husband at bedside. Susan House

## 2021-01-08 NOTE — Progress Notes (Signed)
[facility]  PROGRESS NOTE   Subjective:   Patient seen on dialysis. Permacath works well with a blood flow rate of 300 cc/min with a dialysate flow rate of 600 cc/min. Denies any chest pain or shortness of breath.  Objective:  Vital signs in last 24 hours:  Temp:  [97.5 F (36.4 C)-98.8 F (37.1 C)] 98.8 F (37.1 C) (06/25 1300) Pulse Rate:  [61-85] 72 (06/25 1300) Resp:  [11-22] 15 (06/25 1300) BP: (95-194)/(41-81) 151/48 (06/25 1300) SpO2:  [97 %-100 %] 97 % (06/25 1300)  Weight change:  Filed Weights   01/02/21 0553 01/03/21 0414 01/06/21 0800  Weight: 93 kg 93.1 kg 89 kg    Intake/Output: I/O last 3 completed shifts: In: 860.1 [P.O.:580; I.V.:107.9; IV Piggyback:172.2] Out: 900 [Urine:900]   Intake/Output this shift:  Total I/O In: 717 [P.O.:717] Out: 350 [Urine:350]  Physical Exam: General:  No acute distress  Head:  Normocephalic, atraumatic. Moist oral mucosal membranes  Eyes:  Anicteric  Neck:  Supple  Lungs:   Clear to auscultation, normal effort  Heart:  S1S2 no rubs  Abdomen:   Soft, nontender, bowel sounds present  Extremities:  peripheral edema.  Neurologic:  Awake, alert, following commands  Skin:  No lesions  Access:     Basic Metabolic Panel: Recent Labs  Lab 01/04/21 0421 01/05/21 0508 01/05/21 1751 01/06/21 0545 01/07/21 0651 01/08/21 0534  NA 137 137 137 141 135 134*  K 4.2 3.6 3.6 3.1* 4.5 4.0  CL 104 102 101 104 101 100  CO2 26 30 25 28 26 25   GLUCOSE 237* 152* 184* 93 178* 199*  BUN 50* 26* 28* 34* 40* 52*  CREATININE 2.92* 1.99* 2.68* 2.89* 3.51* 3.75*  CALCIUM 8.6* 8.5* 8.9 8.7* 8.4* 8.1*  PHOS 5.0* 3.3  --  3.9 4.9* 5.1*    CBC: Recent Labs  Lab 01/04/21 0421 01/05/21 0508 01/05/21 1751 01/06/21 0545 01/07/21 0651 01/08/21 0534  WBC 6.2 6.6 6.8 4.6 4.5 4.9  NEUTROABS 3.9 4.9  --  3.0 2.4 2.3  HGB 8.1* 9.4* 9.8* 9.1* 8.1* 8.1*  HCT 24.4* 28.2* 29.3* 27.4* 24.6* 24.6*  MCV 90.0 88.4 89.1 89.5 90.1  89.5  PLT 198 207 216 182 155 176     Urinalysis: Recent Labs    01/06/21 0422  COLORURINE YELLOW*  LABSPEC 1.011  PHURINE 7.0  GLUCOSEU 50*  HGBUR SMALL*  BILIRUBINUR NEGATIVE  KETONESUR NEGATIVE  PROTEINUR >=300*  NITRITE NEGATIVE  LEUKOCYTESUR LARGE*      Imaging: No results found.   Medications:    sodium chloride     sodium chloride Stopped (01/06/21 1455)   cefTRIAXone (ROCEPHIN)  IV 1 g (01/08/21 0840)   sodium chloride      amLODipine  10 mg Oral Daily   aspirin  81 mg Oral Daily   atorvastatin  20 mg Oral QHS   Chlorhexidine Gluconate Cloth  6 each Topical Q0600   cloNIDine  0.3 mg Oral BID   clopidogrel  75 mg Oral Daily   feeding supplement (NEPRO CARB STEADY)  237 mL Oral BID BM   gabapentin  100 mg Oral QHS   heparin  5,000 Units Subcutaneous Q8H   hydrALAZINE  100 mg Oral Q6H   insulin aspart  0-15 Units Subcutaneous TID WC   insulin aspart  5 Units Subcutaneous TID WC   insulin glargine  10 Units Subcutaneous QHS   isosorbide mononitrate  60 mg Oral Daily   levETIRAcetam  250 mg  Oral Once   levETIRAcetam  500 mg Oral Q24H   metoprolol tartrate  50 mg Oral BID   multivitamin  1 tablet Oral QHS   nystatin  1 application Topical BID   pantoprazole  40 mg Oral Daily   polyethylene glycol  17 g Oral Daily   senna-docusate  2 tablet Oral BID   torsemide  20 mg Oral Daily   vitamin B-12  250 mcg Oral Daily    Assessment/ Plan:     Principal Problem:   Acute kidney injury superimposed on chronic kidney disease (HCC) Active Problems:   Hypertension associated with diabetes ([place])   Type 2 diabetes mellitus with complications (HCC)   Hemiparesis due to old cerebrovascular accident ([place])   Hyperlipidemia associated with type 2 diabetes mellitus ([place])   Hx of completed stroke   AKI (acute kidney injury) ([place])   Protein-calorie malnutrition, severe  65 year old female with a history of diabetes, peripheral vascular disease, hypertension,  hyperlipidemia, seizure disorder, history of CVA and CKD stage IV now admitted with acute kidney injury.  Patient was deemed to be in end-stage renal disease and was started on dialysis 617. Presently on TTS dialysis schedule.  She is tolerating dialysis well. Will continue to follow the anemia protocols. She is being evaluated for outpatient dialysis treatment.   LOS: [place], [facility] 6/25/20222:13 PM

## 2021-01-08 NOTE — Plan of Care (Signed)

## 2021-01-09 LAB — RENAL FUNCTION PANEL
Albumin: 2.5 g/dL — ABNORMAL LOW (ref 3.5–5.0)
Anion gap: 9 (ref 5–15)
BUN: 36 mg/dL — ABNORMAL HIGH (ref 8–23)
CO2: 28 mmol/L (ref 22–32)
Calcium: 7.8 mg/dL — ABNORMAL LOW (ref 8.9–10.3)
Chloride: 95 mmol/L — ABNORMAL LOW (ref 98–111)
Creatinine, Ser: 2.56 mg/dL — ABNORMAL HIGH (ref 0.44–1.00)
GFR, Estimated: 20 mL/min — ABNORMAL LOW (ref >60)
Glucose, Bld: 176 mg/dL — ABNORMAL HIGH (ref 70–99)
Phosphorus: 3.9 mg/dL (ref 2.5–4.6)
Potassium: 3.5 mmol/L (ref 3.5–5.1)
Sodium: 132 mmol/L — ABNORMAL LOW (ref 135–145)

## 2021-01-09 LAB — GLUCOSE, CAPILLARY
Glucose-Capillary: 134 mg/dL — ABNORMAL HIGH (ref 70–99)
Glucose-Capillary: 179 mg/dL — ABNORMAL HIGH (ref 70–99)
Glucose-Capillary: 207 mg/dL — ABNORMAL HIGH (ref 70–99)
Glucose-Capillary: 166 mg/dL — ABNORMAL HIGH (ref 70–99)
Glucose-Capillary: 121 mg/dL — ABNORMAL HIGH (ref 70–99)

## 2021-01-09 LAB — CBC WITH DIFFERENTIAL/PLATELET
Abs Immature Granulocytes: 0.05 10*3/uL (ref 0.00–0.07)
Basophils Absolute: 0 10*3/uL (ref 0.0–0.1)
Basophils Relative: 1 %
Eosinophils Absolute: 0.4 10*3/uL (ref 0.0–0.5)
Eosinophils Relative: 9 %
HCT: 24.2 % — ABNORMAL LOW (ref 36.0–46.0)
Hemoglobin: 7.9 g/dL — ABNORMAL LOW (ref 12.0–15.0)
Immature Granulocytes: 1 %
Lymphocytes Relative: 29 %
Lymphs Abs: 1.5 10*3/uL (ref 0.7–4.0)
MCH: 29.2 pg (ref 26.0–34.0)
MCHC: 32.6 g/dL (ref 30.0–36.0)
MCV: 89.3 fL (ref 80.0–100.0)
Monocytes Absolute: 0.9 10*3/uL (ref 0.1–1.0)
Monocytes Relative: 17 %
Neutro Abs: 2.3 10*3/uL (ref 1.7–7.7)
Neutrophils Relative %: 43 %
Platelets: 159 10*3/uL (ref 150–400)
RBC: 2.71 MIL/uL — ABNORMAL LOW (ref 3.87–5.11)
RDW: 14.7 % (ref 11.5–15.5)
WBC: 5.2 10*3/uL (ref 4.0–10.5)
nRBC: 0 % (ref 0.0–0.2)

## 2021-01-09 NOTE — Progress Notes (Signed)
[facility]  PROGRESS NOTE   Subjective:   Patient is out of bed to chair. Family in attendance. Able to ambulate in the room. Urine output was over 2 L yesterday.  Objective:  Vital signs in last 24 hours:  Temp:  [97.6 F (36.4 C)-98.9 F (37.2 C)] 98.9 F (37.2 C) (06/26 0732) Pulse Rate:  [61-74] 65 (06/26 0732) Resp:  [16-20] 18 (06/26 0732) BP: (126-174)/(49-62) 130/49 (06/26 1200) SpO2:  [97 %-99 %] 99 % (06/26 0732)  Weight change:  Filed Weights   01/02/21 0553 01/03/21 0414 01/06/21 0800  Weight: 93 kg 93.1 kg 89 kg    Intake/Output: I/O last 3 completed shifts: In: 16 [P.O.:837] Out: 2050 [Urine:2050]   Intake/Output this shift:  Total I/O In: 240 [P.O.:240] Out: -   Physical Exam: General:  No acute distress  Head:  Normocephalic, atraumatic. Moist oral mucosal membranes  Eyes:  Anicteric  Neck:  Supple  Lungs:   Clear to auscultation, normal effort  Heart:  S1S2 no rubs  Abdomen:   Soft, nontender, bowel sounds present  Extremities:  peripheral edema.  Neurologic:  Awake, alert, following commands  Skin:  No lesions  Access:     Basic Metabolic Panel: Recent Labs  Lab 01/05/21 0508 01/05/21 1751 01/06/21 0545 01/07/21 0651 01/08/21 0534 01/09/21 0608  NA 137 137 141 135 134* 132*  K 3.6 3.6 3.1* 4.5 4.0 3.5  CL 102 101 104 101 100 95*  CO2 30 25 28 26 25 28   GLUCOSE 152* 184* 93 178* 199* 176*  BUN 26* 28* 34* 40* 52* 36*  CREATININE 1.99* 2.68* 2.89* 3.51* 3.75* 2.56*  CALCIUM 8.5* 8.9 8.7* 8.4* 8.1* 7.8*  PHOS 3.3  --  3.9 4.9* 5.1* 3.9    CBC: Recent Labs  Lab 01/05/21 0508 01/05/21 1751 01/06/21 0545 01/07/21 0651 01/08/21 0534 01/09/21 0608  WBC 6.6 6.8 4.6 4.5 4.9 5.2  NEUTROABS 4.9  --  3.0 2.4 2.3 2.3  HGB 9.4* 9.8* 9.1* 8.1* 8.1* 7.9*  HCT 28.2* 29.3* 27.4* 24.6* 24.6* 24.2*  MCV 88.4 89.1 89.5 90.1 89.5 89.3  PLT 207 216 182 155 176 159     Urinalysis: No results for input(s): COLORURINE,  LABSPEC, PHURINE, GLUCOSEU, HGBUR, BILIRUBINUR, KETONESUR, PROTEINUR, UROBILINOGEN, NITRITE, LEUKOCYTESUR in the last 72 hours.  Invalid input(s): APPERANCEUR    Imaging: No results found.   Medications:    sodium chloride     sodium chloride Stopped (01/06/21 1455)   cefTRIAXone (ROCEPHIN)  IV 1 g (01/09/21 0912)   sodium chloride      amLODipine  10 mg Oral Daily   aspirin  81 mg Oral Daily   atorvastatin  20 mg Oral QHS   Chlorhexidine Gluconate Cloth  6 each Topical Q0600   cloNIDine  0.3 mg Oral BID   clopidogrel  75 mg Oral Daily   feeding supplement (NEPRO CARB STEADY)  237 mL Oral BID BM   gabapentin  100 mg Oral QHS   heparin  5,000 Units Subcutaneous Q8H   hydrALAZINE  100 mg Oral Q6H   insulin aspart  0-15 Units Subcutaneous TID AC & HS   insulin aspart  5 Units Subcutaneous TID WC   insulin glargine  10 Units Subcutaneous QHS   isosorbide mononitrate  60 mg Oral Daily   levETIRAcetam  500 mg Oral Q24H   metoprolol tartrate  50 mg Oral BID   multivitamin  1 tablet Oral QHS   nystatin  1  application Topical BID   pantoprazole  40 mg Oral Daily   polyethylene glycol  17 g Oral Daily   senna-docusate  2 tablet Oral BID   torsemide  20 mg Oral Daily   vitamin B-12  250 mcg Oral Daily    Assessment/ Plan:     Principal Problem:   Acute kidney injury superimposed on chronic kidney disease (HCC) Active Problems:   Hypertension associated with diabetes ([place])   Type 2 diabetes mellitus with complications (HCC)   Hemiparesis due to old cerebrovascular accident ([place])   Hyperlipidemia associated with type 2 diabetes mellitus ([place])   Hx of completed stroke   AKI (acute kidney injury) ([place])   Protein-calorie malnutrition, severe  65 year old female with a history of diabetes, peripheral vascular disease, hypertension, hyperlipidemia, seizure disorder, history of CVA and CKD stage IV now admitted with acute kidney injury.   Patient was deemed to be in end-stage  renal disease and was started on dialysis 6/ [place] 17. Presently on TTS dialysis schedule.  She is tolerating dialysis well. Will continue to follow the anemia protocols. She is being evaluated for outpatient dialysis placement.   LOS: [place], [facility] 6/26/20221:06 PM

## 2021-01-09 NOTE — TOC Progression Note (Signed)
Transition of Care [facility]) - Progression Note    Patient Details  Name: Susan House MRN: [id number] Date of Birth: 1955-09-28  [facility]) CM/SW Contact  Beverly Sessions, RN Phone Number: 01/09/2021, 2:59 PM  Clinical Narrative:    Requested for RN to trail sitting for 3-4 hours in the room today   Expected Discharge Plan: [place] Barriers to Discharge: Continued Medical Work up  Expected Discharge Plan and Services Expected Discharge Plan: [place] Acute Care Choice: Resumption of Svcs/PTA Provider Living arrangements for the past 2 months: Single Family Home                                       Social Determinants of Health (SDOH) Interventions    Readmission Risk Interventions No flowsheet data found.

## 2021-01-09 NOTE — Progress Notes (Signed)
PROGRESS NOTE    Susan House  LOV:[id number] DOB: Nov 30, 1955 DOA: 12/14/2020 PCP: Jerrol Banana., MD   Brief Narrative:  65 y.o. female with medical history significant for history of CVA with residual left-sided weakness, left frontal encephalomalacia, CKD stage IIIb-IV, insulin-dependent type 2 diabetes, hypertension, hyperlipidemia, seizure disorder, and memory loss who presents to the ED for evaluation of generalized weakness and abnormal labs, found to have AKI on CKD and evidence of urinary tract infection.  Patient admitted for further management.  Due to worsening kidney function, nephrology consulted, HD was started on 12/31/2020.  Patient still making urine and BP still remains labile.   Assessment & Plan:   Principal Problem:   Acute kidney injury superimposed on chronic kidney disease ([place]) Active Problems:   Hypertension associated with diabetes ([place])   Type 2 diabetes mellitus with complications ([place])   Hemiparesis due to old cerebrovascular accident [facility])   Hyperlipidemia associated with type 2 diabetes mellitus ([place])   Hx of completed stroke   AKI (acute kidney injury) ([place])   Protein-calorie malnutrition, severe  Progression to end-stage kidney disease Started on HD on 6/17, still making good urine output Further HD sessions per nephrology  Renal ultrasound with Doppler rule showed 10.8 x 5.6 cm cystic mass on the right side.  CT abdomen pelvis did not show this mass  Acute metabolic encephalopathy Improved Likely 2/2 ?UTI Vs ??  Postictal state, ??  Seizure Recently completed 3 days of Rocephin for UTI Procalcitonin 1.02-->1.44, will trend UA with large leukocytes, many bacteria, > 50 WBC  UC with multiple species BC X 2 NGTD CXR with atelectasis, otherwise unremarkable Continue IV Rocephin Code stroke was activated, CT head negative for any acute intracranial abnormality Neurology consulted, appreciate recs, no further  recs  Hypokalemia Replace as needed  Refractory/labile hypertension  Continue current regimen of amlodipine, clonidine, hydralazine, metoprolol, imdur, torsemide, pending BP   Anemia of chronic kidney disease and B12 deficiency Hemoglobin stable s/p 1 PRBC  Insulin-dependent type 2 diabetes, uncontrolled A1c 8.6% SSI, Lantus, NovoLog 3 times daily, Accu-Cheks, hypoglycemic protocol   History of CVA with residual left-sided weakness/left frontal encephalomalacia Continue aspirin, Plavix, atorvastatin.   Seizure disorder Continue Keppra Seizure precaution   Memory loss Suspected underlying vascular dementia.   She had some reported confusion/delirium prior to arrival.   Baseline level of mentation is unclear CT head significant for chronic volume loss and small vessel ischemic changes consistent with suspected vascular dementia Outpatient follow-up   Constipation Continue Senokot-S and MiraLAX   DVT prophylaxis: SQ heparin Code Status: DNR Family Communication: None at bedside Disposition Plan: Status is: Inpatient  Remains inpatient appropriate because:Inpatient level of care appropriate due to severity of illness  Dispo: The patient is from: SNF              Anticipated d/c is to: SNF              Patient currently is not medically stable to d/c.   Difficult to place patient No  New start hemodialysis during this admission.  Nephrology following for inpatient hemodialysis needs.  Disposition plan pending.     Consultants:  Nephrology Neurology  Procedures:  Permacath placement  Antimicrobials:  Rocephin    Subjective: Today, patient reports some nausea, but denies any vomiting, abdominal pain, fever/chills.  Patient denies any chest pain, shortness of breath.    Objective: Vitals:   01/09/21 0445 01/09/21 0732 01/09/21 1200 01/09/21 1506  BP: Marland Kitchen)  131/51 (!) 174/62 (!) 130/49 (!) 159/72  Pulse: 61 65  66  Resp: 20 18  18   Temp: 97.6 F (36.4 C)  98.9 F (37.2 C)  97.7 F (36.5 C)  TempSrc: Oral     SpO2: 97% 99%  99%  Weight:      Height:        Intake/Output Summary (Last 24 hours) at 01/09/2021 1712 Last data filed at 01/09/2021 1331 Gross per 24 hour  Intake 600 ml  Output 1100 ml  Net -500 ml   Filed Weights   01/02/21 0553 01/03/21 0414 01/06/21 0800  Weight: 93 kg 93.1 kg 89 kg    Examination: General: NAD, chronically ill-appearing Cardiovascular: S1, S2 present Respiratory: Diminished breath sounds bilaterally Abdomen: Soft, nontender, nondistended, bowel sounds present Musculoskeletal: No bilateral pedal edema noted Skin: Normal Psychiatry: Normal mood Neurology: Residual left-sided weakness, no other new focal neurologic deficits noted     Data Reviewed: I have personally reviewed following labs and imaging studies  CBC: Recent Labs  Lab 01/05/21 0508 01/05/21 1751 01/06/21 0545 01/07/21 0651 01/08/21 0534 01/09/21 0608  WBC 6.6 6.8 4.6 4.5 4.9 5.2  NEUTROABS 4.9  --  3.0 2.4 2.3 2.3  HGB 9.4* 9.8* 9.1* 8.1* 8.1* 7.9*  HCT 28.2* 29.3* 27.4* 24.6* 24.6* 24.2*  MCV 88.4 89.1 89.5 90.1 89.5 89.3  PLT 207 216 182 155 176 782   Basic Metabolic Panel: Recent Labs  Lab 01/05/21 0508 01/05/21 1751 01/06/21 0545 01/07/21 0651 01/08/21 0534 01/09/21 0608  NA 137 137 141 135 134* 132*  K 3.6 3.6 3.1* 4.5 4.0 3.5  CL 102 101 104 101 100 95*  CO2 30 25 28 26 25 28   GLUCOSE 152* 184* 93 178* 199* 176*  BUN 26* 28* 34* 40* 52* 36*  CREATININE 1.99* 2.68* 2.89* 3.51* 3.75* 2.56*  CALCIUM 8.5* 8.9 8.7* 8.4* 8.1* 7.8*  PHOS 3.3  --  3.9 4.9* 5.1* 3.9   GFR: Estimated Creatinine Clearance: 23 mL/min (A) (by C-G formula based on SCr of 2.56 mg/dL (H)). Liver Function Tests: Recent Labs  Lab 01/05/21 1751 01/06/21 0545 01/07/21 0651 01/08/21 0534 01/09/21 0608  AST 27  --   --   --   --   ALT 10  --   --   --   --   ALKPHOS 69  --   --   --   --   BILITOT 0.6  --   --   --   --   PROT  6.8  --   --   --   --   ALBUMIN 3.2* 2.9* 2.7* 2.4* 2.5*   No results for input(s): LIPASE, AMYLASE in the last 168 hours. No results for input(s): AMMONIA in the last 168 hours. Coagulation Profile: No results for input(s): INR, PROTIME in the last 168 hours. Cardiac Enzymes: No results for input(s): CKTOTAL, CKMB, CKMBINDEX, TROPONINI in the last 168 hours. BNP (last 3 results) No results for input(s): PROBNP in the last 8760 hours. HbA1C: No results for input(s): HGBA1C in the last 72 hours. CBG: Recent Labs  Lab 01/08/21 2036 01/09/21 0121 01/09/21 0731 01/09/21 1131 01/09/21 1623  GLUCAP 318* 134* 179* 207* 166*   Lipid Profile: No results for input(s): CHOL, HDL, LDLCALC, TRIG, CHOLHDL, LDLDIRECT in the last 72 hours. Thyroid Function Tests: No results for input(s): TSH, T4TOTAL, FREET4, T3FREE, THYROIDAB in the last 72 hours. Anemia Panel: No results for input(s): VITAMINB12, FOLATE, FERRITIN, TIBC, IRON,  RETICCTPCT in the last 72 hours.  Sepsis Labs: Recent Labs  Lab 01/06/21 0819 01/07/21 0651 01/08/21 0534  PROCALCITON 1.02 1.44 1.27    Recent Results (from the past 240 hour(s))  Urine Culture     Status: Abnormal   Collection Time: 01/06/21  4:22 AM   Specimen: Urine, Random  Result Value Ref Range Status   Specimen Description   Final    URINE, RANDOM Performed at [facility], [address], [place], [place] [address]    Special Requests   Final    NONE Performed at [facility], [address] [place], [place] [address]    Culture (A)  Final    70,000 COLONIES/mL MULTIPLE SPECIES PRESENT, SUGGEST RECOLLECTION   Report Status 01/07/2021 FINAL  Final  CULTURE, BLOOD (ROUTINE X 2) w Reflex to ID Panel     Status: None (Preliminary result)   Collection Time: 01/06/21  8:19 AM   Specimen: BLOOD  Result Value Ref Range Status   Specimen Description BLOOD RIGHT [facility]  Final   Special Requests   Final    BOTTLES DRAWN AEROBIC AND  ANAEROBIC Blood Culture adequate volume   Culture   Final    NO GROWTH 3 DAYS Performed at [facility], [address], [place], [place] [address]    Report Status PENDING  Incomplete  CULTURE, BLOOD (ROUTINE X 2) w Reflex to ID Panel     Status: None (Preliminary result)   Collection Time: 01/06/21  8:21 AM   Specimen: BLOOD  Result Value Ref Range Status   Specimen Description BLOOD RIGHT HAND  Final   Special Requests   Final    BOTTLES DRAWN AEROBIC AND ANAEROBIC Blood Culture adequate volume   Culture   Final    NO GROWTH 3 DAYS Performed at [facility], [address] [place],  [address]    Report Status PENDING  Incomplete          Radiology Studies: No results found.      Scheduled Meds:  amLODipine  10 mg Oral Daily   aspirin  81 mg Oral Daily   atorvastatin  20 mg Oral QHS   Chlorhexidine Gluconate Cloth  6 each Topical Q0600   cloNIDine  0.3 mg Oral BID   clopidogrel  75 mg Oral Daily   feeding supplement (NEPRO CARB STEADY)  237 mL Oral BID BM   gabapentin  100 mg Oral QHS   heparin  5,000 Units Subcutaneous Q8H   hydrALAZINE  100 mg Oral Q6H   insulin aspart  0-15 Units Subcutaneous TID AC & HS   insulin aspart  5 Units Subcutaneous TID WC   insulin glargine  10 Units Subcutaneous QHS   isosorbide mononitrate  60 mg Oral Daily   levETIRAcetam  500 mg Oral Q24H   metoprolol tartrate  50 mg Oral BID   multivitamin  1 tablet Oral QHS   nystatin  1 application Topical BID   pantoprazole  40 mg Oral Daily   polyethylene glycol  17 g Oral Daily   senna-docusate  2 tablet Oral BID   torsemide  20 mg Oral Daily   vitamin B-12  250 mcg Oral Daily   Continuous Infusions:  sodium chloride     sodium chloride Stopped (01/06/21 1455)   cefTRIAXone (ROCEPHIN)  IV 1 g (01/09/21 0912)   sodium chloride       LOS: 24 days     Alma Friendly,  MD [facility]  If 7PM-7AM, please contact  night-coverage  01/09/2021, 5:12 PM

## 2021-01-10 ENCOUNTER — Other Ambulatory Visit: Payer: Self-pay | Admitting: Family Medicine

## 2021-01-10 LAB — CBC WITH DIFFERENTIAL/PLATELET
Abs Immature Granulocytes: 0.04 10*3/uL (ref 0.00–0.07)
Basophils Absolute: 0 10*3/uL (ref 0.0–0.1)
Basophils Relative: 1 %
Eosinophils Absolute: 0.4 10*3/uL (ref 0.0–0.5)
Eosinophils Relative: 8 %
HCT: 22.9 % — ABNORMAL LOW (ref 36.0–46.0)
Hemoglobin: 7.5 g/dL — ABNORMAL LOW (ref 12.0–15.0)
Immature Granulocytes: 1 %
Lymphocytes Relative: 30 %
Lymphs Abs: 1.6 10*3/uL (ref 0.7–4.0)
MCH: 29.6 pg (ref 26.0–34.0)
MCHC: 32.8 g/dL (ref 30.0–36.0)
MCV: 90.5 fL (ref 80.0–100.0)
Monocytes Absolute: 0.8 10*3/uL (ref 0.1–1.0)
Monocytes Relative: 14 %
Neutro Abs: 2.5 10*3/uL (ref 1.7–7.7)
Neutrophils Relative %: 46 %
Platelets: 157 10*3/uL (ref 150–400)
RBC: 2.53 MIL/uL — ABNORMAL LOW (ref 3.87–5.11)
RDW: 14.7 % (ref 11.5–15.5)
WBC: 5.4 10*3/uL (ref 4.0–10.5)
nRBC: 0 % (ref 0.0–0.2)

## 2021-01-10 LAB — RENAL FUNCTION PANEL
Albumin: 2.4 g/dL — ABNORMAL LOW (ref 3.5–5.0)
Anion gap: 6 (ref 5–15)
BUN: 48 mg/dL — ABNORMAL HIGH (ref 8–23)
CO2: 29 mmol/L (ref 22–32)
Calcium: 8 mg/dL — ABNORMAL LOW (ref 8.9–10.3)
Chloride: 98 mmol/L (ref 98–111)
Creatinine, Ser: 3.28 mg/dL — ABNORMAL HIGH (ref 0.44–1.00)
GFR, Estimated: 15 mL/min — ABNORMAL LOW (ref >60)
Glucose, Bld: 159 mg/dL — ABNORMAL HIGH (ref 70–99)
Phosphorus: 5.6 mg/dL — ABNORMAL HIGH (ref 2.5–4.6)
Potassium: 3.8 mmol/L (ref 3.5–5.1)
Sodium: 133 mmol/L — ABNORMAL LOW (ref 135–145)

## 2021-01-10 LAB — GLUCOSE, CAPILLARY
Glucose-Capillary: 147 mg/dL — ABNORMAL HIGH (ref 70–99)
Glucose-Capillary: 175 mg/dL — ABNORMAL HIGH (ref 70–99)
Glucose-Capillary: 190 mg/dL — ABNORMAL HIGH (ref 70–99)
Glucose-Capillary: 213 mg/dL — ABNORMAL HIGH (ref 70–99)

## 2021-01-10 MED ORDER — EPOETIN ALFA 10000 UNIT/ML IJ SOLN
4000.0000 [IU] | INTRAMUSCULAR | Status: DC
  Administered 2021-01-11: 4000 [IU] via INTRAVENOUS
  Filled 2021-01-10 (×2): qty 0.4

## 2021-01-10 NOTE — Progress Notes (Signed)
[facility]  ROUNDING NOTE   Subjective:   Patient resting in bed eating breakfast Alert and oriented Denies pain and discomfort Denies nausea and vomiting Denies shortness of breath     Objective:  Vital signs in last 24 hours:  Temp:  [97.4 F (36.3 C)-98.2 F (36.8 C)] 97.7 F (36.5 C) (06/27 0733) Pulse Rate:  [57-69] 67 (06/27 1000) Resp:  [14-18] 18 (06/27 0733) BP: (130-168)/(40-72) 149/58 (06/27 1000) SpO2:  [97 %-99 %] 97 % (06/27 0733) Weight:  [92.9 kg] 92.9 kg (06/27 0425)  Weight change:  Filed Weights   01/03/21 0414 01/06/21 0800 01/10/21 0425  Weight: 93.1 kg 89 kg 92.9 kg    Intake/Output: I/O last 3 completed shifts: In: 800 [P.O.:600; IV Piggyback:200] Out: 2094 [Urine:1650]   Intake/Output this shift:  Total I/O In: 120 [P.O.:120] Out: -   Physical Exam: General: Laying in bed, NAD  Head: Normocephalic, atraumatic.moist oral mucosal membranes  Eyes: Anicteric  Lungs:  Clear , normal effort  Heart: regular  Abdomen:  Soft, nontender, non distended  Extremities:  No  peripheral edema.  Neurologic:  Alert to self and place.   Skin: No acute lesions or rashes  Access: RIJ permcath    Basic Metabolic Panel: Recent Labs  Lab 01/06/21 0545 01/07/21 0651 01/08/21 0534 01/09/21 0608 01/10/21 0523  NA 141 135 134* 132* 133*  K 3.1* 4.5 4.0 3.5 3.8  CL 104 101 100 95* 98  CO2 28 26 25 28 29   GLUCOSE 93 178* 199* 176* 159*  BUN 34* 40* 52* 36* 48*  CREATININE 2.89* 3.51* 3.75* 2.56* 3.28*  CALCIUM 8.7* 8.4* 8.1* 7.8* 8.0*  PHOS 3.9 4.9* 5.1* 3.9 5.6*     Liver Function Tests: Recent Labs  Lab 01/05/21 1751 01/06/21 0545 01/07/21 0651 01/08/21 0534 01/09/21 0608 01/10/21 0523  AST 27  --   --   --   --   --   ALT 10  --   --   --   --   --   ALKPHOS 69  --   --   --   --   --   BILITOT 0.6  --   --   --   --   --   PROT 6.8  --   --   --   --   --   ALBUMIN 3.2* 2.9* 2.7* 2.4* 2.5* 2.4*    No results for  input(s): LIPASE, AMYLASE in the last 168 hours. No results for input(s): AMMONIA in the last 168 hours.  CBC: Recent Labs  Lab 01/06/21 0545 01/07/21 0651 01/08/21 0534 01/09/21 0608 01/10/21 0523  WBC 4.6 4.5 4.9 5.2 5.4  NEUTROABS 3.0 2.4 2.3 2.3 2.5  HGB 9.1* 8.1* 8.1* 7.9* 7.5*  HCT 27.4* 24.6* 24.6* 24.2* 22.9*  MCV 89.5 90.1 89.5 89.3 90.5  PLT 182 155 176 159 157     Cardiac Enzymes: No results for input(s): CKTOTAL, CKMB, CKMBINDEX, TROPONINI in the last 168 hours.  BNP: Invalid input(s): POCBNP  CBG: Recent Labs  Lab 01/09/21 1131 01/09/21 1623 01/09/21 2042 01/10/21 0733 01/10/21 1133  GLUCAP 207* 166* 121* 147* 175*     Microbiology: Results for orders placed or performed during the hospital encounter of 12/14/20  SARS CORONAVIRUS 2 (TAT 6-24 HRS) Nasopharyngeal Nasopharyngeal Swab     Status: None   Collection Time: 12/14/20  8:28 PM   Specimen: Nasopharyngeal Swab  Result Value Ref Range Status   SARS Coronavirus  2 NEGATIVE NEGATIVE Final    Comment: (NOTE) SARS-CoV-2 target nucleic acids are NOT DETECTED.  The SARS-CoV-2 RNA is generally detectable in upper and lower respiratory specimens during the acute phase of infection. Negative results do not preclude SARS-CoV-2 infection, do not rule out co-infections with other pathogens, and should not be used as the sole basis for treatment or other patient management decisions. Negative results must be combined with clinical observations, patient history, and epidemiological information. The expected result is Negative.  Fact Sheet for Patients: SugarRoll.be  Fact Sheet for Healthcare Providers: https://www.woods-mathews.com/  This test is not yet approved or cleared by the [place] FDA and  has been authorized for detection and/or diagnosis of SARS-CoV-2 by FDA under an Emergency Use Authorization (EUA). This EUA will remain  in effect (meaning  this test can be used) for the duration of the COVID-19 declaration under Se ction 564(b)(1) of the Act, 21 U.S.C. section 360bbb-3(b)(1), unless the authorization is terminated or revoked sooner.  Performed at [facility], [place] [address] [place], [place] [address]   SARS CORONAVIRUS 2 (TAT 6-24 HRS) Nasopharyngeal Nasopharyngeal Swab     Status: None   Collection Time: 12/19/20  4:40 PM   Specimen: Nasopharyngeal Swab  Result Value Ref Range Status   SARS Coronavirus 2 NEGATIVE NEGATIVE Final    Comment: (NOTE) SARS-CoV-2 target nucleic acids are NOT DETECTED.  The SARS-CoV-2 RNA is generally detectable in upper and lower respiratory specimens during the acute phase of infection. Negative results do not preclude SARS-CoV-2 infection, do not rule out co-infections with other pathogens, and should not be used as the sole basis for treatment or other patient management decisions. Negative results must be combined with clinical observations, patient history, and epidemiological information. The expected result is Negative.  Fact Sheet for Patients: SugarRoll.be  Fact Sheet for Healthcare Providers: https://www.woods-mathews.com/  This test is not yet approved or cleared by the [place] FDA and  has been authorized for detection and/or diagnosis of SARS-CoV-2 by FDA under an Emergency Use Authorization (EUA). This EUA will remain  in effect (meaning this test can be used) for the duration of the COVID-19 declaration under Se ction 564(b)(1) of the Act, 21 U.S.C. section 360bbb-3(b)(1), unless the authorization is terminated or revoked sooner.  Performed at [facility], [place] [address] [place], [place] [address]   Urine Culture     Status: Abnormal   Collection Time: 01/06/21  4:22 AM   Specimen: Urine, Random  Result Value Ref Range Status   Specimen Description   Final    URINE, RANDOM Performed at [facility], [address] [place], [place] [address]    Special Requests   Final    NONE Performed at [facility], [address] [place], [place] [address]    Culture (A)  Final    70,000 COLONIES/mL MULTIPLE SPECIES PRESENT, SUGGEST RECOLLECTION   Report Status 01/07/2021 FINAL  Final  CULTURE, BLOOD (ROUTINE X 2) w Reflex to ID Panel     Status: None (Preliminary result)   Collection Time: 01/06/21  8:19 AM   Specimen: BLOOD  Result Value Ref Range Status   Specimen Description BLOOD RIGHT [facility]  Final   Special Requests   Final    BOTTLES DRAWN AEROBIC AND ANAEROBIC Blood Culture adequate volume   Culture   Final    NO GROWTH 4 DAYS Performed at [facility], [address], [place], [place] [address]  Report Status PENDING  Incomplete  CULTURE, BLOOD (ROUTINE X 2) w Reflex to ID Panel     Status: None (Preliminary result)   Collection Time: 01/06/21  8:21 AM   Specimen: BLOOD  Result Value Ref Range Status   Specimen Description BLOOD RIGHT HAND  Final   Special Requests   Final    BOTTLES DRAWN AEROBIC AND ANAEROBIC Blood Culture adequate volume   Culture   Final    NO GROWTH 4 DAYS Performed at [facility], [address] [place], [place] [address]    Report Status PENDING  Incomplete    Coagulation Studies: No results for input(s): LABPROT, INR in the last 72 hours.  Urinalysis: No results for input(s): COLORURINE, LABSPEC, PHURINE, GLUCOSEU, HGBUR, BILIRUBINUR, KETONESUR, PROTEINUR, UROBILINOGEN, NITRITE, LEUKOCYTESUR in the last 72 hours.  Invalid input(s): APPERANCEUR     Imaging: No results found.   Medications:    sodium chloride     sodium chloride Stopped (01/06/21 1455)   cefTRIAXone (ROCEPHIN)  IV 1 g (01/10/21 0832)    amLODipine  10 mg Oral Daily   aspirin  81 mg Oral Daily   atorvastatin  20 mg Oral QHS   Chlorhexidine Gluconate Cloth  6 each Topical Q0600   cloNIDine  0.3 mg Oral BID   clopidogrel   75 mg Oral Daily   feeding supplement (NEPRO CARB STEADY)  237 mL Oral BID BM   gabapentin  100 mg Oral QHS   heparin  5,000 Units Subcutaneous Q8H   hydrALAZINE  100 mg Oral Q6H   insulin aspart  0-15 Units Subcutaneous TID AC & HS   insulin aspart  5 Units Subcutaneous TID WC   insulin glargine  10 Units Subcutaneous QHS   isosorbide mononitrate  60 mg Oral Daily   levETIRAcetam  500 mg Oral Q24H   metoprolol tartrate  50 mg Oral BID   multivitamin  1 tablet Oral QHS   nystatin  1 application Topical BID   pantoprazole  40 mg Oral Daily   polyethylene glycol  17 g Oral Daily   senna-docusate  2 tablet Oral BID   torsemide  20 mg Oral Daily   vitamin B-12  250 mcg Oral Daily   sodium chloride, sodium chloride, acetaminophen **OR** acetaminophen, alteplase, heparin, hydrALAZINE, HYDROcodone-acetaminophen, hydrocortisone, iohexol, lidocaine (PF), lidocaine-prilocaine, ondansetron **OR** ondansetron (ZOFRAN) IV, pentafluoroprop-tetrafluoroeth, senna-docusate  Assessment/ Plan:  [name] is a 65 y.o. white female with diabetes mellitus type II, hypertension, CVA with residual left hemiplegia, hyperlipidemia, congestive heart failure who was admitted to [facility] on 12/14/2020 for AKI (acute kidney injury) ([place]) [N17.9] Altered mental status, unspecified altered mental status type [R41.82] Acute kidney injury superimposed on chronic kidney disease ([place]) [B35.3, G99.2]  Complicated and prolonged hospitalization. 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  Recent acute kidney injury secondary to ATN, prerenal azotemia and uncontrolled hypertension. Inconsistent recovery and results of 24 hours urine creatinine clearance indicate ESRD Initiated on dialysis on 12/31/20 -Next treatment scheduled for tomorrow. - Outpatient planning for [facility]. Patient will sit for dialysis tomorrow. Spoke with nursing  about getting patient to chair today   Lab Results  Component Value Date   CREATININE 3.28 (H) 01/10/2021   CREATININE 2.56 (H) 01/09/2021   CREATININE 3.75 (H) 01/08/2021    Intake/Output Summary (Last 24 hours) at 01/10/2021 1149 Last data filed at 01/10/2021 0900 Gross per 24 hour  Intake  680 ml  Output 550 ml  Net 130 ml     2. Hypertension: 140s/50s. Volume status at goal.   Current regimen of amlodipine, clonidine, hydralazine, isosorbide mononitrate, metoprolol and torsemide. Improved control for this patient  3. 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  Hgb A1c 8.6% on 11/29/20 Ssi managed by primary team  4. Anemia of CKD with iron deficiency.  Lab Results  Component Value Date   HGB 7.5 (L) 01/10/2021  - given IV iron on 6/19.  -will continue to monitor - Will begin EPO 4000 units with dialysis treatments   LOS: 25 [place] 6/27/202211:49 AM

## 2021-01-10 NOTE — Progress Notes (Signed)
Occupational Therapy Treatment Patient Details Name: Susan House MRN: [id number] DOB: [birth year] Today's Date: 01/10/2021    History of present illness Pt is a 65 y.o. female with medical history significant for history of CVA with residual left-sided weakness, left frontal encephalomalacia, CKD stage IIIb-IV, insulin-dependent type 2 diabetes, hypertension, hyperlipidemia, seizure disorder, and memory loss who presents to the ED for evaluation of generalized weakness and abnormal labs, potential UTI. Patient recently admitted 11/28/2020-12/03/2020 for AKI on CKD, discharged to SNF.   OT comments  Pt seen for OT treatment on this date. Upon arrival to room, pt asleep in bed however easily awoken. Husband at bedside. Pt with improved communication this date and able to answer all open ended questions. Pt alert and oriented to self and month (stated year was 2011/2002), and agreeable to OT tx. Patient fatigued following minimal activity, appearing exhausted after bringing LE towards EOB during supine>sit transfer attempt and deferring further mobility. Pt engaged in bed mobility (rolling with use of bedrails and MIN A), bed-level therapy exercises (see below), and bed-level grooming tasks (with set-up assist). Pt and spouse encouraged to continue participate in bed-level exercises independently (at least 3x/day) and encouraged to resume use of resting hand splint at nighttime. Of note, pt's spouse reporting that pt has not been wearing hand splint, and re-education on the importance of splint provided. Pt continues to benefit from skilled OT services to maximize return to PLOF and minimize risk of future falls, injury, caregiver burden, and readmission. Will continue to follow POC. Discharge recommendation remains appropriate.     Follow Up Recommendations  SNF;Supervision/Assistance - 24 hour    Equipment Recommendations  Other (comment) (defer to next venue of care)    Recommendations for  Other Services      Precautions / Restrictions Precautions Precautions: Fall Precaution Comments: L hemi Restrictions Weight Bearing Restrictions: No       Mobility Bed Mobility Overal bed mobility: Needs Assistance Bed Mobility: Rolling Rolling: Min assist    General bed mobility comments: Patient fatigued following minimal activity, appearing exhausted after bringing LE towards EOB during supine>sit transfer attempts. Pt deferring further mobility and engaged in bed-level therapy exercises         ADL either performed or assessed with clinical judgement   ADL Overall ADL's : Needs assistance/impaired     Grooming: Brushing hair;Set up;Bed level               Lower Body Dressing: Maximal assistance;Bed level                        Cognition Arousal/Alertness: Awake/alert Behavior During Therapy: Flat affect Overall Cognitive Status: Impaired/Different from baseline                                 General Comments: Pt with improved communication this date and able to answer all open ended questions. Pt alert and oriented to self and month (stated year was 2011, 2002). Pt demonstrates decreased short term memory. Pt fatigues quickly and requires multimodal cues for functional mobility        Exercises General Exercises - Upper Extremity Shoulder Flexion: AAROM;Both;10 reps;Supine Elbow Flexion: AAROM;Both;10 reps;Supine General Exercises - Lower Extremity Ankle Circles/Pumps: AROM;Strengthening;Both;10 reps;Supine Heel Slides: AROM;Both;10 reps;Supine           Pertinent Vitals/ Pain       Pain Assessment: No/denies pain  Frequency  Min 2X/week        Progress Toward Goals  OT Goals(current goals can now be found in the care plan section)  Progress towards OT goals: Progressing toward goals  Acute Rehab OT Goals Patient Stated Goal: To get stronger OT Goal Formulation: With patient Time For Goal Achievement:  01/13/21 Potential to Achieve Goals: [place] Discharge plan remains appropriate;Frequency remains appropriate       AM-PAC OT "6 Clicks" Daily Activity     Outcome Measure   Help from another person eating meals?: A Little Help from another person taking care of personal grooming?: A Little Help from another person toileting, which includes using toliet, bedpan, or urinal?: Total Help from another person bathing (including washing, rinsing, drying)?: A Lot Help from another person to put on and taking off regular upper body clothing?: A Lot Help from another person to put on and taking off regular lower body clothing?: A Lot 6 Click Score: 13    End of Session    OT Visit Diagnosis: Unsteadiness on feet (R26.81);Muscle weakness (generalized) (M62.81)   Activity Tolerance Patient limited by fatigue   Patient Left in bed;with call bell/phone within reach;with bed alarm set;with family/visitor present   Nurse Communication Mobility status        Time: 9784-7841 OT Time Calculation (min): 27 min  Charges: OT General Charges $OT Visit: 1 Visit OT Treatments $Therapeutic Activity: 23-37 mins  Fredirick Maudlin, OTR/L [place]
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
## 2021-01-10 NOTE — Progress Notes (Signed)
PROGRESS NOTE    Susan House  HKV:[id number] DOB: Nov 16, 1955 DOA: 12/14/2020 PCP: Jerrol Banana., MD   Brief Narrative:  65 y.o. female with medical history significant for history of CVA with residual left-sided weakness, left frontal encephalomalacia, CKD stage IIIb-IV, insulin-dependent type 2 diabetes, hypertension, hyperlipidemia, seizure disorder, and memory loss who presents to the ED for evaluation of generalized weakness and abnormal labs, found to have AKI on CKD and evidence of urinary tract infection.  Patient admitted for further management.  Due to worsening kidney function, nephrology consulted, HD was started on 12/31/2020.  Patient still making urine and BP still remains labile.   Assessment & Plan:   Principal Problem:   Acute kidney injury superimposed on chronic kidney disease ([place]) Active Problems:   Hypertension associated with diabetes ([place])   Type 2 diabetes mellitus with complications ([place])   Hemiparesis due to old cerebrovascular accident [facility])   Hyperlipidemia associated with type 2 diabetes mellitus ([place])   Hx of completed stroke   AKI (acute kidney injury) ([place])   Protein-calorie malnutrition, severe   Progression to end-stage kidney disease Started on HD on 6/17, still making good urine output Further HD sessions per nephrology  Renal ultrasound with Doppler rule showed 10.8 x 5.6 cm cystic mass on the right side.  CT abdomen pelvis did not show this mass  Acute metabolic encephalopathy Improved Likely 2/2 ?UTI Vs ??  Postictal state, ??  Seizure Recently completed 3 days of Rocephin for UTI Procalcitonin 1.02-->1.44 UA with large leukocytes, many bacteria, > 50 WBC  UC with multiple species BC X 2 NGTD CXR with atelectasis, otherwise unremarkable Completed 5 days of IV Rocephin Code stroke was activated, CT head negative for any acute intracranial abnormality Neurology consulted, appreciate recs, no further  recs  Hypokalemia Replace as needed  Refractory/labile hypertension  Continue current regimen of amlodipine, clonidine, hydralazine, metoprolol, imdur, torsemide, pending BP   Anemia of chronic kidney disease and B12 deficiency Hemoglobin stable s/p 1 PRBC  Insulin-dependent type 2 diabetes, uncontrolled A1c 8.6% SSI, Lantus, NovoLog 3 times daily, Accu-Cheks, hypoglycemic protocol   History of CVA with residual left-sided weakness/left frontal encephalomalacia Continue aspirin, Plavix, atorvastatin.   Seizure disorder Continue Keppra Seizure precaution   Memory loss Suspected underlying vascular dementia.   She had some reported confusion/delirium prior to arrival.   Baseline level of mentation is unclear CT head significant for chronic volume loss and small vessel ischemic changes consistent with suspected vascular dementia Outpatient follow-up   Constipation Continue Senokot-S and MiraLAX   DVT prophylaxis: SQ heparin Code Status: DNR Family Communication: None at bedside Disposition Plan: Status is: Inpatient  Remains inpatient appropriate because:Inpatient level of care appropriate due to severity of illness  Dispo: The patient is from: SNF              Anticipated d/c is to: SNF              Patient currently is not medically stable to d/c.   Difficult to place patient No  New start hemodialysis during this admission.  Nephrology following for inpatient hemodialysis needs.  Disposition plan pending.     Consultants:  Nephrology Neurology  Procedures:  Permacath placement  Antimicrobials:  Rocephin    Subjective: Today, patient denies any new complaints.  Plan for patient to sit in chair for HD/to be able to transition as an outpatient.    Objective: Vitals:   01/10/21 0425 01/10/21 0733 01/10/21 1000 01/10/21  1521  BP: (!) 140/54 (!) 168/55 (!) 149/58 (!) 151/49  Pulse: (!) 57 (!) 58 67 61  Resp: 14 18  18   Temp: (!) 97.4 F (36.3 C)  97.7 F (36.5 C)  97.7 F (36.5 C)  TempSrc:  Oral  Oral  SpO2: 99% 97%  100%  Weight: 92.9 kg     Height:        Intake/Output Summary (Last 24 hours) at 01/10/2021 1555 Last data filed at 01/10/2021 1300 Gross per 24 hour  Intake 560 ml  Output 550 ml  Net 10 ml   Filed Weights   01/03/21 0414 01/06/21 0800 01/10/21 0425  Weight: 93.1 kg 89 kg 92.9 kg    Examination: General: NAD, chronically ill-appearing Cardiovascular: S1, S2 present Respiratory: Diminished breath sounds bilaterally Abdomen: Soft, nontender, nondistended, bowel sounds present Musculoskeletal: No bilateral pedal edema noted Skin: Normal Psychiatry: Normal mood Neurology: Residual left-sided weakness, no other new focal neurologic deficits noted    Data Reviewed: I have personally reviewed following labs and imaging studies  CBC: Recent Labs  Lab 01/06/21 0545 01/07/21 0651 01/08/21 0534 01/09/21 0608 01/10/21 0523  WBC 4.6 4.5 4.9 5.2 5.4  NEUTROABS 3.0 2.4 2.3 2.3 2.5  HGB 9.1* 8.1* 8.1* 7.9* 7.5*  HCT 27.4* 24.6* 24.6* 24.2* 22.9*  MCV 89.5 90.1 89.5 89.3 90.5  PLT 182 155 176 159 852   Basic Metabolic Panel: Recent Labs  Lab 01/06/21 0545 01/07/21 0651 01/08/21 0534 01/09/21 0608 01/10/21 0523  NA 141 135 134* 132* 133*  K 3.1* 4.5 4.0 3.5 3.8  CL 104 101 100 95* 98  CO2 28 26 25 28 29   GLUCOSE 93 178* 199* 176* 159*  BUN 34* 40* 52* 36* 48*  CREATININE 2.89* 3.51* 3.75* 2.56* 3.28*  CALCIUM 8.7* 8.4* 8.1* 7.8* 8.0*  PHOS 3.9 4.9* 5.1* 3.9 5.6*   GFR: Estimated Creatinine Clearance: 18.4 mL/min (A) (by C-G formula based on SCr of 3.28 mg/dL (H)). Liver Function Tests: Recent Labs  Lab 01/05/21 1751 01/06/21 0545 01/07/21 0651 01/08/21 0534 01/09/21 0608 01/10/21 0523  AST 27  --   --   --   --   --   ALT 10  --   --   --   --   --   ALKPHOS 69  --   --   --   --   --   BILITOT 0.6  --   --   --   --   --   PROT 6.8  --   --   --   --   --   ALBUMIN 3.2* 2.9*  2.7* 2.4* 2.5* 2.4*   No results for input(s): LIPASE, AMYLASE in the last 168 hours. No results for input(s): AMMONIA in the last 168 hours. Coagulation Profile: No results for input(s): INR, PROTIME in the last 168 hours. Cardiac Enzymes: No results for input(s): CKTOTAL, CKMB, CKMBINDEX, TROPONINI in the last 168 hours. BNP (last 3 results) No results for input(s): PROBNP in the last 8760 hours. HbA1C: No results for input(s): HGBA1C in the last 72 hours. CBG: Recent Labs  Lab 01/09/21 1131 01/09/21 1623 01/09/21 2042 01/10/21 0733 01/10/21 1133  GLUCAP 207* 166* 121* 147* 175*   Lipid Profile: No results for input(s): CHOL, HDL, LDLCALC, TRIG, CHOLHDL, LDLDIRECT in the last 72 hours. Thyroid Function Tests: No results for input(s): TSH, T4TOTAL, FREET4, T3FREE, THYROIDAB in the last 72 hours. Anemia Panel: No results for input(s): VITAMINB12, FOLATE, FERRITIN,  TIBC, IRON, RETICCTPCT in the last 72 hours.  Sepsis Labs: Recent Labs  Lab 01/06/21 0819 01/07/21 0651 01/08/21 0534  PROCALCITON 1.02 1.44 1.27    Recent Results (from the past 240 hour(s))  Urine Culture     Status: Abnormal   Collection Time: 01/06/21  4:22 AM   Specimen: Urine, Random  Result Value Ref Range Status   Specimen Description   Final    URINE, RANDOM Performed at [facility], [address] [place], [place] [address]    Special Requests   Final    NONE Performed at [facility], [address] [place], [place] [address]    Culture (A)  Final    70,000 COLONIES/mL MULTIPLE SPECIES PRESENT, SUGGEST RECOLLECTION   Report Status 01/07/2021 FINAL  Final  CULTURE, BLOOD (ROUTINE X 2) w Reflex to ID Panel     Status: None (Preliminary result)   Collection Time: 01/06/21  8:19 AM   Specimen: BLOOD  Result Value Ref Range Status   Specimen Description BLOOD RIGHT [facility]  Final   Special Requests   Final    BOTTLES DRAWN AEROBIC AND ANAEROBIC Blood Culture adequate volume    Culture   Final    NO GROWTH 4 DAYS Performed at [facility], [address], [place], [place] [address]    Report Status PENDING  Incomplete  CULTURE, BLOOD (ROUTINE X 2) w Reflex to ID Panel     Status: None (Preliminary result)   Collection Time: 01/06/21  8:21 AM   Specimen: BLOOD  Result Value Ref Range Status   Specimen Description BLOOD RIGHT HAND  Final   Special Requests   Final    BOTTLES DRAWN AEROBIC AND ANAEROBIC Blood Culture adequate volume   Culture   Final    NO GROWTH 4 DAYS Performed at [facility], [address], [place], [place] [address]    Report Status PENDING  Incomplete          Radiology Studies: No results found.      Scheduled Meds:  amLODipine  10 mg Oral Daily   aspirin  81 mg Oral Daily   atorvastatin  20 mg Oral QHS   Chlorhexidine Gluconate Cloth  6 each Topical Q0600   cloNIDine  0.3 mg Oral BID   clopidogrel  75 mg Oral Daily   [START ON 01/11/2021] epoetin (EPOGEN/PROCRIT) injection  4,000 Units Intravenous Q T,Th,Sa-HD   feeding supplement (NEPRO CARB STEADY)  237 mL Oral BID BM   gabapentin  100 mg Oral QHS   heparin  5,000 Units Subcutaneous Q8H   hydrALAZINE  100 mg Oral Q6H   insulin aspart  0-15 Units Subcutaneous TID AC & HS   insulin aspart  5 Units Subcutaneous TID WC   insulin glargine  10 Units Subcutaneous QHS   isosorbide mononitrate  60 mg Oral Daily   levETIRAcetam  500 mg Oral Q24H   metoprolol tartrate  50 mg Oral BID   multivitamin  1 tablet Oral QHS   nystatin  1 application Topical BID   pantoprazole  40 mg Oral Daily   polyethylene glycol  17 g Oral Daily   senna-docusate  2 tablet Oral BID   torsemide  20 mg Oral Daily   vitamin B-12  250 mcg Oral Daily   Continuous Infusions:  sodium chloride     sodium chloride Stopped (01/06/21 1455)   cefTRIAXone (ROCEPHIN)  IV 1 g (01/10/21 7782)  LOS: 25 days     Alma Friendly, MD [facility]  If 7PM-7AM,  please contact night-coverage  01/10/2021, 3:55 PM

## 2021-01-10 NOTE — Progress Notes (Signed)
Physical Therapy Treatment Patient Details Name: Susan House MRN: [id number] DOB: 06/10/1956 Today's Date: 01/10/2021    History of Present Illness Pt is a 65 y.o. female with medical history significant for history of CVA with residual left-sided weakness, left frontal encephalomalacia, CKD stage IIIb-IV, insulin-dependent type 2 diabetes, hypertension, hyperlipidemia, seizure disorder, and memory loss who presents to the ED for evaluation of generalized weakness and abnormal labs, potential UTI. Patient recently admitted 11/28/2020-12/03/2020 for AKI on CKD, discharged to SNF.    PT Comments    Patient agreeable to PT. No family at the bedside. Nurse present and assisted with mobility. Patient required +2 person assistance to perform sit to stand transfer x 3 bouts. Verbal cues for technique. Patient fatigued with activity. Unable to stand long enough for pivot transfer to chair.  Patient would benefit from continued PT to maximize independence and decrease caregiver burden. SNF recommended at discharge.    Follow Up Recommendations  SNF     Equipment Recommendations  None recommended by PT;Other (comment)    Recommendations for Other Services       Precautions / Restrictions Precautions Precautions: Fall Restrictions Weight Bearing Restrictions: No    Mobility  Bed Mobility Overal bed mobility: Needs Assistance Bed Mobility: Supine to Sit;Sit to Supine     Supine to sit: Max assist Sit to supine: Max assist;+2 for physical assistance   General bed mobility comments: assistance for LE and trunk support. verbla cues for task initiation and sequencing. increased assistance required for return to bed    Transfers Overall transfer level: Needs assistance Equipment used: None Transfers: Sit to/from Stand Sit to Stand: Max assist;+2 physical assistance         General transfer comment: 3 bouts of standing performed. patient unable to stand with only one person  assistance. patient fatigued with activity. unable to stand long enough for safely trying to pivot to the chair. maximal cues for hand placement and technique to facilitate independence  Ambulation/Gait                 Stairs             Wheelchair Mobility    Modified Rankin (Stroke Patients Only)       Balance Overall balance assessment: Needs assistance Sitting-balance support: Single extremity supported;Feet supported Sitting balance-Leahy Scale: Fair                                      Cognition Arousal/Alertness: Awake/alert Behavior During Therapy: Flat affect Overall Cognitive Status: Difficult to assess                                 General Comments: patient able to follow single step commands with extra time      Exercises      General Comments General comments (skin integrity, edema, etc.): will extend care plan from 2 weeks from re-evaluation      Pertinent Vitals/Pain Pain Assessment: No/denies pain    Home Living                      Prior Function            PT Goals (current goals can now be found in the care plan section) Acute Rehab PT Goals Patient Stated Goal: To get stronger PT  Goal Formulation: With patient Time For Goal Achievement: 01/21/21 Potential to Achieve Goals: Fair Progress towards PT goals: Progressing toward goals    Frequency    Min 2X/week      PT Plan Current plan remains appropriate    Co-evaluation              AM-PAC PT "6 Clicks" Mobility   Outcome Measure  Help needed turning from your back to your side while in a flat bed without using bedrails?: A Lot Help needed moving from lying on your back to sitting on the side of a flat bed without using bedrails?: A Lot Help needed moving to and from a bed to a chair (including a wheelchair)?: Total Help needed standing up from a chair using your arms (e.g., wheelchair or bedside chair)?: Total Help  needed to walk in hospital room?: Total Help needed climbing 3-5 steps with a railing? : Total 6 Click Score: 8    End of Session Equipment Utilized During Treatment: Gait belt Activity Tolerance: Patient tolerated treatment well Patient left: in bed;with call bell/phone within reach;with bed alarm set Nurse Communication: Mobility status PT Visit Diagnosis: Other abnormalities of gait and mobility (R26.89);Muscle weakness (generalized) (M62.81)     Time: 6789-3810 PT Time Calculation (min) (ACUTE ONLY): 28 min  Charges:  $Therapeutic Activity: 23-37 mins                     {Susan House [name], PT, MPT    Susan House 01/10/2021, 1:18 PM

## 2021-01-10 NOTE — TOC Progression Note (Signed)
Transition of Care [facility]) - Progression Note    Patient Details  Name: Britne Borelli MRN: [id number] Date of Birth: 03-18-1956  Transition of [facility]) CM/SW [place], RN Phone Number: 01/10/2021, 1:40 PM  Clinical Narrative:  Spoke with patient about SNF rehab. When discharged, patient declines says she will discharge home. TOC to continue to track.    Expected Discharge Plan: [place] Barriers to Discharge: Continued Medical Work up  Expected Discharge Plan and Services Expected Discharge Plan: [place] Acute Care Choice: Resumption of Svcs/PTA Provider Living arrangements for the past 2 months: Single Family Home                                       Social Determinants of Health (SDOH) Interventions    Readmission Risk Interventions No flowsheet data found.

## 2021-01-11 LAB — GLUCOSE, CAPILLARY
Glucose-Capillary: 105 mg/dL — ABNORMAL HIGH (ref 70–99)
Glucose-Capillary: 135 mg/dL — ABNORMAL HIGH (ref 70–99)
Glucose-Capillary: 237 mg/dL — ABNORMAL HIGH (ref 70–99)
Glucose-Capillary: 194 mg/dL — ABNORMAL HIGH (ref 70–99)

## 2021-01-11 LAB — CULTURE, BLOOD (ROUTINE X 2)
Culture: NO GROWTH
Culture: NO GROWTH
Special Requests: ADEQUATE
Special Requests: ADEQUATE

## 2021-01-11 LAB — CBC WITH DIFFERENTIAL/PLATELET
Abs Immature Granulocytes: 0.06 10*3/uL (ref 0.00–0.07)
Basophils Absolute: 0.1 10*3/uL (ref 0.0–0.1)
Basophils Relative: 1 %
Eosinophils Absolute: 0.5 10*3/uL (ref 0.0–0.5)
Eosinophils Relative: 9 %
HCT: 23.8 % — ABNORMAL LOW (ref 36.0–46.0)
Hemoglobin: 8 g/dL — ABNORMAL LOW (ref 12.0–15.0)
Immature Granulocytes: 1 %
Lymphocytes Relative: 28 %
Lymphs Abs: 1.6 10*3/uL (ref 0.7–4.0)
MCH: 29.9 pg (ref 26.0–34.0)
MCHC: 33.6 g/dL (ref 30.0–36.0)
MCV: 88.8 fL (ref 80.0–100.0)
Monocytes Absolute: 0.7 10*3/uL (ref 0.1–1.0)
Monocytes Relative: 12 %
Neutro Abs: 2.9 10*3/uL (ref 1.7–7.7)
Neutrophils Relative %: 49 %
Platelets: 180 10*3/uL (ref 150–400)
RBC: 2.68 MIL/uL — ABNORMAL LOW (ref 3.87–5.11)
RDW: 15.1 % (ref 11.5–15.5)
WBC: 5.8 10*3/uL (ref 4.0–10.5)
nRBC: 0 % (ref 0.0–0.2)

## 2021-01-11 LAB — RENAL FUNCTION PANEL
Albumin: 2.7 g/dL — ABNORMAL LOW (ref 3.5–5.0)
Anion gap: 9 (ref 5–15)
BUN: 57 mg/dL — ABNORMAL HIGH (ref 8–23)
CO2: 28 mmol/L (ref 22–32)
Calcium: 8.2 mg/dL — ABNORMAL LOW (ref 8.9–10.3)
Chloride: 99 mmol/L (ref 98–111)
Creatinine, Ser: 3.49 mg/dL — ABNORMAL HIGH (ref 0.44–1.00)
GFR, Estimated: 14 mL/min — ABNORMAL LOW (ref >60)
Glucose, Bld: 98 mg/dL (ref 70–99)
Phosphorus: 6.1 mg/dL — ABNORMAL HIGH (ref 2.5–4.6)
Potassium: 3.7 mmol/L (ref 3.5–5.1)
Sodium: 136 mmol/L (ref 135–145)

## 2021-01-11 MED ORDER — LEVETIRACETAM 250 MG PO TABS
250.0000 mg | ORAL_TABLET | Freq: Once | ORAL | Status: AC
  Administered 2021-01-11: 250 mg via ORAL
  Filled 2021-01-11: qty 1

## 2021-01-11 NOTE — Progress Notes (Signed)
Patient is alert with no complaints, tx went well and UF goal completed at 1000.

## 2021-01-11 NOTE — Progress Notes (Signed)
Spoke with [facility] in central telemetry at [phone] to transfer patient from room 203 to [facility].

## 2021-01-11 NOTE — Progress Notes (Signed)
Nutrition Follow-up  DOCUMENTATION CODES:  Severe malnutrition in context of acute illness/injury, Obesity unspecified  INTERVENTION:  Continue Magic Cup TID.  Continue Nepro shakes BID.  Continue Rena-Vit daily.  NUTRITION DIAGNOSIS:  Severe Malnutrition related to acute illness, chronic illness, poor appetite as evidenced by energy intake < or equal to 50% for > or equal to 5 days, percent weight loss. - ongoing  GOAL:  Patient will meet greater than or equal to 90% of their needs - variably meeting  MONITOR:  PO intake, Supplement acceptance, Labs, Weight trends, I & O's  REASON FOR ASSESSMENT:  LOS    ASSESSMENT:  65 y.o. female with medical history significant for history of CVA with residual left-sided weakness, left frontal encephalomalacia, CKD stage IIIb-IV, insulin-dependent type 2 diabetes, hypertension, hyperlipidemia, seizure disorder and memory loss (vascular dementia) who is admitted with AKI and progression to ESRD now s/p perm cath and HD initiation 6/17  Pt continues to have good appetite and oral intake; pt eating 45-100% of meals in hospital.   Pt is receiving Magic Cups on meals trays, Nepro shakes, and Rena-Vit as pt receiving HD.   Per chart, pt has remained weight stable since admit but is currently down ~26lbs from her UBW.  Admit wt: 79.4 kg Current wt: 91.7 kg  UOP: 500 ml + x5 unmeasured in last 24 hrs  Medications: reviewed; Nepro shakes BID, SSI, bedtime Lantus, Keppra, Rena-Vit, miralax, Senokot BID, Vitamin B12  Labs: reviewed; Phos 6.1, CBG 105-213  Diet Order:   Diet Order             Diet renal with fluid restriction Fluid restriction: 1200 mL Fluid; Room service appropriate? Yes; Fluid consistency: Thin  Diet effective now                  EDUCATION NEEDS:  Education needs have been addressed  Skin:  Skin Assessment: Reviewed RN Assessment  Last BM:  01/08/21  Height:  Ht Readings from Last 1 Encounters:  12/24/20 5\' 2"   (1.575 m)   Weight:  Wt Readings from Last 1 Encounters:  01/11/21 91.7 kg   Ideal Body Weight:  50 kg  BMI:  Body mass index is 36.98 kg/m.  Estimated Nutritional Needs:  Kcal:  1900-2200kcal/day Protein:  95-110g/day Fluid:  UOP +1L  Derrel Nip, RD, LDN (she/her/hers) Registered Dietitian I After-Hours/Weekend Pager # in [place]

## 2021-01-11 NOTE — Progress Notes (Signed)
[facility]  ROUNDING NOTE   Subjective:   Patient seen during dialysis   HEMODIALYSIS FLOWSHEET:  Blood Flow Rate (mL/min): 150 mL/min Arterial Pressure (mmHg): -50 mmHg Venous Pressure (mmHg): 20 mmHg Transmembrane Pressure (mmHg): 30 mmHg Ultrafiltration Rate (mL/min): 70 mL/min Dialysate Flow Rate (mL/min): 500 ml/min Conductivity: Machine : 13.9 Conductivity: Machine : 13.9 Dialysis Fluid Bolus: Normal Saline Bolus Amount (mL): 250 mL  Patient seated in chair No complaints at this time    Objective:  Vital signs in last 24 hours:  Temp:  [97.7 F (36.5 C)-98.7 F (37.1 C)] 97.9 F (36.6 C) (06/28 0853) Pulse Rate:  [58-69] 64 (06/28 0853) Resp:  [11-19] 11 (06/28 1130) BP: (148-192)/(48-148) 164/67 (06/28 1130) SpO2:  [99 %-100 %] 99 % (06/27 1953) Weight:  [91.7 kg] 91.7 kg (06/28 0533)  Weight change: -1.2 kg Filed Weights   01/06/21 0800 01/10/21 0425 01/11/21 0533  Weight: 89 kg 92.9 kg 91.7 kg    Intake/Output: I/O last 3 completed shifts: In: 360 [P.O.:360] Out: 500 [Urine:500]   Intake/Output this shift:  Total I/O In: -  Out: 500 [Other:500]  Physical Exam: General: NAD. Seated in chair  Head: Normocephalic, atraumatic.moist oral mucosal membranes  Eyes: Anicteric  Lungs:  Clear , normal effort  Heart: regular  Abdomen:  Soft, nontender, non distended  Extremities:  No  peripheral edema.  Neurologic:  Alert to self and place.   Skin: No acute lesions or rashes  Access: RIJ permcath    Basic Metabolic Panel: Recent Labs  Lab 01/07/21 0651 01/08/21 0534 01/09/21 0608 01/10/21 0523 01/11/21 0540  NA 135 134* 132* 133* 136  K 4.5 4.0 3.5 3.8 3.7  CL 101 100 95* 98 99  CO2 26 25 28 29 28   GLUCOSE 178* 199* 176* 159* 98  BUN 40* 52* 36* 48* 57*  CREATININE 3.51* 3.75* 2.56* 3.28* 3.49*  CALCIUM 8.4* 8.1* 7.8* 8.0* 8.2*  PHOS 4.9* 5.1* 3.9 5.6* 6.1*     Liver Function Tests: Recent Labs  Lab 01/05/21 1751  01/06/21 0545 01/07/21 0651 01/08/21 0534 01/09/21 0608 01/10/21 0523 01/11/21 0540  AST 27  --   --   --   --   --   --   ALT 10  --   --   --   --   --   --   ALKPHOS 69  --   --   --   --   --   --   BILITOT 0.6  --   --   --   --   --   --   PROT 6.8  --   --   --   --   --   --   ALBUMIN 3.2*   < > 2.7* 2.4* 2.5* 2.4* 2.7*   < > = values in this interval not displayed.    No results for input(s): LIPASE, AMYLASE in the last 168 hours. No results for input(s): AMMONIA in the last 168 hours.  CBC: Recent Labs  Lab 01/07/21 0651 01/08/21 0534 01/09/21 0608 01/10/21 0523 01/11/21 0540  WBC 4.5 4.9 5.2 5.4 5.8  NEUTROABS 2.4 2.3 2.3 2.5 2.9  HGB 8.1* 8.1* 7.9* 7.5* 8.0*  HCT 24.6* 24.6* 24.2* 22.9* 23.8*  MCV 90.1 89.5 89.3 90.5 88.8  PLT 155 176 159 157 180     Cardiac Enzymes: No results for input(s): CKTOTAL, CKMB, CKMBINDEX, TROPONINI in the last 168 hours.  BNP: Invalid input(s): POCBNP  CBG: Recent Labs  Lab 01/10/21 0733 01/10/21 1133 01/10/21 1736 01/10/21 2102 01/11/21 0736  GLUCAP 147* 175* 190* 213* 105*     Microbiology: Results for orders placed or performed during the hospital encounter of 12/14/20  SARS CORONAVIRUS 2 (TAT 6-24 HRS) Nasopharyngeal Nasopharyngeal Swab     Status: None   Collection Time: 12/14/20  8:28 PM   Specimen: Nasopharyngeal Swab  Result Value Ref Range Status   SARS Coronavirus 2 NEGATIVE NEGATIVE Final    Comment: (NOTE) SARS-CoV-2 target nucleic acids are NOT DETECTED.  The SARS-CoV-2 RNA is generally detectable in upper and lower respiratory specimens during the acute phase of infection. Negative results do not preclude SARS-CoV-2 infection, do not rule out co-infections with other pathogens, and should not be used as the sole basis for treatment or other patient management decisions. Negative results must be combined with clinical observations, patient history, and epidemiological information. The  expected result is Negative.  Fact Sheet for Patients: SugarRoll.be  Fact Sheet for Healthcare Providers: https://www.woods-mathews.com/  This test is not yet approved or cleared by the [place] FDA and  has been authorized for detection and/or diagnosis of SARS-CoV-2 by FDA under an Emergency Use Authorization (EUA). This EUA will remain  in effect (meaning this test can be used) for the duration of the COVID-19 declaration under Se ction 564(b)(1) of the Act, 21 U.S.C. section 360bbb-3(b)(1), unless the authorization is terminated or revoked sooner.  Performed at [facility], [place] [address] [place], [place] [address]   SARS CORONAVIRUS 2 (TAT 6-24 HRS) Nasopharyngeal Nasopharyngeal Swab     Status: None   Collection Time: 12/19/20  4:40 PM   Specimen: Nasopharyngeal Swab  Result Value Ref Range Status   SARS Coronavirus 2 NEGATIVE NEGATIVE Final    Comment: (NOTE) SARS-CoV-2 target nucleic acids are NOT DETECTED.  The SARS-CoV-2 RNA is generally detectable in upper and lower respiratory specimens during the acute phase of infection. Negative results do not preclude SARS-CoV-2 infection, do not rule out co-infections with other pathogens, and should not be used as the sole basis for treatment or other patient management decisions. Negative results must be combined with clinical observations, patient history, and epidemiological information. The expected result is Negative.  Fact Sheet for Patients: SugarRoll.be  Fact Sheet for Healthcare Providers: https://www.woods-mathews.com/  This test is not yet approved or cleared by the [place] FDA and  has been authorized for detection and/or diagnosis of SARS-CoV-2 by FDA under an Emergency Use Authorization (EUA). This EUA will remain  in effect (meaning this test can be used) for the duration of the COVID-19 declaration under  Se ction 564(b)(1) of the Act, 21 U.S.C. section 360bbb-3(b)(1), unless the authorization is terminated or revoked sooner.  Performed at [facility], [place] [address], [place], [place] [address]   Urine Culture     Status: Abnormal   Collection Time: 01/06/21  4:22 AM   Specimen: Urine, Random  Result Value Ref Range Status   Specimen Description   Final    URINE, RANDOM Performed at [facility], [address] [place], [place] [address]    Special Requests   Final    NONE Performed at [facility], [address] [place], [place] [address]    Culture (A)  Final    70,000 COLONIES/mL MULTIPLE SPECIES PRESENT, SUGGEST RECOLLECTION   Report Status 01/07/2021 FINAL  Final  CULTURE, BLOOD (ROUTINE X 2) w Reflex to ID Panel  Status: None   Collection Time: 01/06/21  8:19 AM   Specimen: BLOOD  Result Value Ref Range Status   Specimen Description BLOOD RIGHT [facility]  Final   Special Requests   Final    BOTTLES DRAWN AEROBIC AND ANAEROBIC Blood Culture adequate volume   Culture   Final    NO GROWTH 5 DAYS Performed at [facility], [address] [place], [place] [address]    Report Status 01/11/2021 FINAL  Final  CULTURE, BLOOD (ROUTINE X 2) w Reflex to ID Panel     Status: None   Collection Time: 01/06/21  8:21 AM   Specimen: BLOOD  Result Value Ref Range Status   Specimen Description BLOOD RIGHT HAND  Final   Special Requests   Final    BOTTLES DRAWN AEROBIC AND ANAEROBIC Blood Culture adequate volume   Culture   Final    NO GROWTH 5 DAYS Performed at [facility], [address], [place],  [address]    Report Status 01/11/2021 FINAL  Final    Coagulation Studies: No results for input(s): LABPROT, INR in the last 72 hours.  Urinalysis: No results for input(s): COLORURINE, LABSPEC, PHURINE, GLUCOSEU, HGBUR, BILIRUBINUR, KETONESUR, PROTEINUR, UROBILINOGEN, NITRITE, LEUKOCYTESUR in the last 72 hours.  Invalid  input(s): APPERANCEUR     Imaging: No results found.   Medications:    sodium chloride     sodium chloride Stopped (01/06/21 1455)    amLODipine  10 mg Oral Daily   aspirin  81 mg Oral Daily   atorvastatin  20 mg Oral QHS   Chlorhexidine Gluconate Cloth  6 each Topical Q0600   cloNIDine  0.3 mg Oral BID   clopidogrel  75 mg Oral Daily   epoetin (EPOGEN/PROCRIT) injection  4,000 Units Intravenous Q T,Th,Sa-HD   feeding supplement (NEPRO CARB STEADY)  237 mL Oral BID BM   gabapentin  100 mg Oral QHS   heparin  5,000 Units Subcutaneous Q8H   hydrALAZINE  100 mg Oral Q6H   insulin aspart  0-15 Units Subcutaneous TID AC & HS   insulin aspart  5 Units Subcutaneous TID WC   insulin glargine  10 Units Subcutaneous QHS   isosorbide mononitrate  60 mg Oral Daily   levETIRAcetam  500 mg Oral Q24H   metoprolol tartrate  50 mg Oral BID   multivitamin  1 tablet Oral QHS   nystatin  1 application Topical BID   pantoprazole  40 mg Oral Daily   polyethylene glycol  17 g Oral Daily   senna-docusate  2 tablet Oral BID   torsemide  20 mg Oral Daily   vitamin B-12  250 mcg Oral Daily   sodium chloride, sodium chloride, acetaminophen **OR** acetaminophen, alteplase, heparin, hydrALAZINE, HYDROcodone-acetaminophen, hydrocortisone, iohexol, lidocaine (PF), lidocaine-prilocaine, ondansetron **OR** ondansetron (ZOFRAN) IV, pentafluoroprop-tetrafluoroeth, senna-docusate  Assessment/ Plan:  [name] is a 65 y.o. white female with diabetes mellitus type II, hypertension, CVA with residual left hemiplegia, hyperlipidemia, congestive heart failure who was admitted to [facility] on 12/14/2020 for AKI (acute kidney injury) ([place]) [N17.9] Altered mental status, unspecified altered mental status type [R41.82] Acute kidney injury superimposed on chronic kidney disease ([place]) [K99.8, P38.2]  Complicated and prolonged hospitalization. Initiated on hemodialysis on 6/17  End stage renal disease  requiring dialysis Nephrotic range proteinuria.  Recent acute kidney injury secondary to ATN, prerenal azotemia and uncontrolled hypertension. Inconsistent recovery and results of 24 hours urine creatinine clearance indicate ESRD Initiated on dialysis on  12/31/20 - Receiving dialysis today, UF goal 500 ml - Patient was seated in chair for this treatment -Next treatment scheduled for Thursday - Outpatient arranged for [facility], TTS.   Lab Results  Component Value Date   CREATININE 3.49 (H) 01/11/2021   CREATININE 3.28 (H) 01/10/2021   CREATININE 2.56 (H) 01/09/2021    Intake/Output Summary (Last 24 hours) at 01/11/2021 1210 Last data filed at 01/11/2021 1130 Gross per 24 hour  Intake 240 ml  Output 1000 ml  Net -760 ml     2. Hypertension: 160s/60s. Volume status at goal.   Current regimen of amlodipine, clonidine, hydralazine, isosorbide mononitrate, metoprolol and torsemide.  3. 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  Hgb A1c 8.6% on 11/29/20 Ssi managed by primary team  4. Anemia of CKD with iron deficiency.  Lab Results  Component Value Date   HGB 8.0 (L) 01/11/2021  - given IV iron on 6/19.  - will continue to monitor - EPO 4000 units with dialysis treatments   LOS: Lilly 6/28/202212:10 PM

## 2021-01-11 NOTE — Telephone Encounter (Signed)
Requested medications are due for refill today yes  Requested medications are on the active medication list yes  Last refill 5/3  Notes to clinic Historical Provider

## 2021-01-11 NOTE — Progress Notes (Signed)
PROGRESS NOTE    Susan House  DUK:[id number] DOB: 04/14/1956 DOA: 12/14/2020 PCP: Jerrol Banana., MD   Brief Narrative:  65 y.o. female with medical history significant for history of CVA with residual left-sided weakness, left frontal encephalomalacia, CKD stage IIIb-IV, insulin-dependent type 2 diabetes, hypertension, hyperlipidemia, seizure disorder, and memory loss who presents to the ED for evaluation of generalized weakness and abnormal labs, found to have AKI on CKD and evidence of urinary tract infection.  Patient admitted for further management.  Due to worsening kidney function, nephrology consulted, HD was started on 12/31/2020.  Patient still making urine and BP still remains labile.   Assessment & Plan:   Principal Problem:   Acute kidney injury superimposed on chronic kidney disease ([place]) Active Problems:   Hypertension associated with diabetes ([place])   Type 2 diabetes mellitus with complications ([place])   Hemiparesis due to old cerebrovascular accident [facility])   Hyperlipidemia associated with type 2 diabetes mellitus ([place])   Hx of completed stroke   AKI (acute kidney injury) ([place])   Protein-calorie malnutrition, severe   Progression to end-stage kidney disease Started on HD on 6/17, still making good urine output Further HD sessions per nephrology  Renal ultrasound with Doppler rule showed 10.8 x 5.6 cm cystic mass on the right side.  CT abdomen pelvis did not show this mass Patient has outpatient HD set up already for T/TH/S  Acute metabolic encephalopathy Improved Likely 2/2 ?UTI Vs ??  Postictal state, ??  Seizure Recently completed 3 days of Rocephin for UTI Procalcitonin 1.02-->1.44 UA with large leukocytes, many bacteria, > 50 WBC  UC with multiple species BC X 2 NGTD CXR with atelectasis, otherwise unremarkable Completed 5 days of IV Rocephin Code stroke was activated, CT head negative for any acute intracranial abnormality Neurology  consulted, appreciate recs, no further recs  Hypokalemia Replace as needed  Refractory/labile hypertension  Continue current regimen of amlodipine, clonidine, hydralazine, metoprolol, imdur, torsemide, pending BP   Anemia of chronic kidney disease and B12 deficiency Hemoglobin stable s/p 1 PRBC  Insulin-dependent type 2 diabetes, uncontrolled A1c 8.6% SSI, Lantus, NovoLog 3 times daily, Accu-Cheks, hypoglycemic protocol   History of CVA with residual left-sided weakness/left frontal encephalomalacia Continue aspirin, Plavix, atorvastatin.   Seizure disorder Continue Keppra Seizure precaution   Memory loss Suspected underlying vascular dementia.   She had some reported confusion/delirium prior to arrival.   Baseline level of mentation is unclear CT head significant for chronic volume loss and small vessel ischemic changes consistent with suspected vascular dementia Outpatient follow-up   Constipation Continue Senokot-S and MiraLAX   DVT prophylaxis: SQ heparin Code Status: DNR Family Communication: Discussed with daughter over the phone on 01/11/2021 Disposition Plan: Status is: Inpatient  Remains inpatient appropriate because:Inpatient level of care appropriate due to severity of illness  Dispo: The patient is from: SNF              Anticipated d/c is to: SNF              Patient currently is medically stable to d/c.   Difficult to place patient No       Consultants:  Nephrology Neurology  Procedures:  Permacath placement  Antimicrobials:  Rocephin    Subjective: Today, patient denies any new complaints.  Was able to sit on a chair for the entire session of dialysis    Objective: Vitals:   01/11/21 1100 01/11/21 1115 01/11/21 1130 01/11/21 1237  BP: (!) 192/129 Marland Kitchen)  168/148 (!) 164/67 132/81  Pulse:    70  Resp: 18 16 11 16   Temp:    97.7 F (36.5 C)  TempSrc:    Oral  SpO2:    100%  Weight:      Height:        Intake/Output Summary (Last  24 hours) at 01/11/2021 1814 Last data filed at 01/11/2021 1130 Gross per 24 hour  Intake --  Output 500 ml  Net -500 ml   Filed Weights   01/06/21 0800 01/10/21 0425 01/11/21 0533  Weight: 89 kg 92.9 kg 91.7 kg    Examination: General: NAD, chronically ill-appearing Cardiovascular: S1, S2 present Respiratory: Diminished breath sounds bilaterally Abdomen: Soft, nontender, nondistended, bowel sounds present Musculoskeletal: No bilateral pedal edema noted Skin: Normal Psychiatry: Normal mood Neurology: Residual left-sided weakness, no other new focal neurologic deficits noted    Data Reviewed: I have personally reviewed following labs and imaging studies  CBC: Recent Labs  Lab 01/07/21 0651 01/08/21 0534 01/09/21 0608 01/10/21 0523 01/11/21 0540  WBC 4.5 4.9 5.2 5.4 5.8  NEUTROABS 2.4 2.3 2.3 2.5 2.9  HGB 8.1* 8.1* 7.9* 7.5* 8.0*  HCT 24.6* 24.6* 24.2* 22.9* 23.8*  MCV 90.1 89.5 89.3 90.5 88.8  PLT 155 176 159 157 938   Basic Metabolic Panel: Recent Labs  Lab 01/07/21 0651 01/08/21 0534 01/09/21 0608 01/10/21 0523 01/11/21 0540  NA 135 134* 132* 133* 136  K 4.5 4.0 3.5 3.8 3.7  CL 101 100 95* 98 99  CO2 26 25 28 29 28   GLUCOSE 178* 199* 176* 159* 98  BUN 40* 52* 36* 48* 57*  CREATININE 3.51* 3.75* 2.56* 3.28* 3.49*  CALCIUM 8.4* 8.1* 7.8* 8.0* 8.2*  PHOS 4.9* 5.1* 3.9 5.6* 6.1*   GFR: Estimated Creatinine Clearance: 17.1 mL/min (A) (by C-G formula based on SCr of 3.49 mg/dL (H)). Liver Function Tests: Recent Labs  Lab 01/05/21 1751 01/06/21 0545 01/07/21 0651 01/08/21 0534 01/09/21 0608 01/10/21 0523 01/11/21 0540  AST 27  --   --   --   --   --   --   ALT 10  --   --   --   --   --   --   ALKPHOS 69  --   --   --   --   --   --   BILITOT 0.6  --   --   --   --   --   --   PROT 6.8  --   --   --   --   --   --   ALBUMIN 3.2*   < > 2.7* 2.4* 2.5* 2.4* 2.7*   < > = values in this interval not displayed.   No results for input(s): LIPASE,  AMYLASE in the last 168 hours. No results for input(s): AMMONIA in the last 168 hours. Coagulation Profile: No results for input(s): INR, PROTIME in the last 168 hours. Cardiac Enzymes: No results for input(s): CKTOTAL, CKMB, CKMBINDEX, TROPONINI in the last 168 hours. BNP (last 3 results) No results for input(s): PROBNP in the last 8760 hours. HbA1C: No results for input(s): HGBA1C in the last 72 hours. CBG: Recent Labs  Lab 01/10/21 1736 01/10/21 2102 01/11/21 0736 01/11/21 1219 01/11/21 1619  GLUCAP 190* 213* 105* 135* 237*   Lipid Profile: No results for input(s): CHOL, HDL, LDLCALC, TRIG, CHOLHDL, LDLDIRECT in the last 72 hours. Thyroid Function Tests: No results for input(s): TSH, T4TOTAL, FREET4, T3FREE, THYROIDAB  in the last 72 hours. Anemia Panel: No results for input(s): VITAMINB12, FOLATE, FERRITIN, TIBC, IRON, RETICCTPCT in the last 72 hours.  Sepsis Labs: Recent Labs  Lab 01/06/21 0819 01/07/21 0651 01/08/21 0534  PROCALCITON 1.02 1.44 1.27    Recent Results (from the past 240 hour(s))  Urine Culture     Status: Abnormal   Collection Time: 01/06/21  4:22 AM   Specimen: Urine, Random  Result Value Ref Range Status   Specimen Description   Final    URINE, RANDOM Performed at [facility], [address] [place], [place] [address]    Special Requests   Final    NONE Performed at [facility], [address] [place], [place] [address]    Culture (A)  Final    70,000 COLONIES/mL MULTIPLE SPECIES PRESENT, SUGGEST RECOLLECTION   Report Status 01/07/2021 FINAL  Final  CULTURE, BLOOD (ROUTINE X 2) w Reflex to ID Panel     Status: None   Collection Time: 01/06/21  8:19 AM   Specimen: BLOOD  Result Value Ref Range Status   Specimen Description BLOOD RIGHT [facility]  Final   Special Requests   Final    BOTTLES DRAWN AEROBIC AND ANAEROBIC Blood Culture adequate volume   Culture   Final    NO GROWTH 5 DAYS Performed at [facility],  [address] [place], [place] [address]    Report Status 01/11/2021 FINAL  Final  CULTURE, BLOOD (ROUTINE X 2) w Reflex to ID Panel     Status: None   Collection Time: 01/06/21  8:21 AM   Specimen: BLOOD  Result Value Ref Range Status   Specimen Description BLOOD RIGHT HAND  Final   Special Requests   Final    BOTTLES DRAWN AEROBIC AND ANAEROBIC Blood Culture adequate volume   Culture   Final    NO GROWTH 5 DAYS Performed at [facility], [address] [place], [place] [address]    Report Status 01/11/2021 FINAL  Final          Radiology Studies: No results found.      Scheduled Meds:  amLODipine  10 mg Oral Daily   aspirin  81 mg Oral Daily   atorvastatin  20 mg Oral QHS   Chlorhexidine Gluconate Cloth  6 each Topical Q0600   cloNIDine  0.3 mg Oral BID   clopidogrel  75 mg Oral Daily   epoetin (EPOGEN/PROCRIT) injection  4,000 Units Intravenous Q T,Th,Sa-HD   feeding supplement (NEPRO CARB STEADY)  237 mL Oral BID BM   gabapentin  100 mg Oral QHS   heparin  5,000 Units Subcutaneous Q8H   hydrALAZINE  100 mg Oral Q6H   insulin aspart  0-15 Units Subcutaneous TID AC & HS   insulin aspart  5 Units Subcutaneous TID WC   insulin glargine  10 Units Subcutaneous QHS   isosorbide mononitrate  60 mg Oral Daily   levETIRAcetam  500 mg Oral Q24H   metoprolol tartrate  50 mg Oral BID   multivitamin  1 tablet Oral QHS   nystatin  1 application Topical BID   pantoprazole  40 mg Oral Daily   polyethylene glycol  17 g Oral Daily   senna-docusate  2 tablet Oral BID   torsemide  20 mg Oral Daily   vitamin B-12  250 mcg Oral Daily   Continuous Infusions:  sodium chloride     sodium chloride Stopped (01/06/21 1455)     LOS:  26 days     Alma Friendly, MD [facility]  If 7PM-7AM, please contact night-coverage  01/11/2021, 6:14 PM

## 2021-01-11 NOTE — Progress Notes (Signed)
Physical Therapy Treatment Patient Details Name: Susan House MRN: [id number] DOB: 1956-03-03 Today's Date: 01/11/2021    History of Present Illness Pt is a 65 y.o. female with medical history significant for history of CVA with residual left-sided weakness, left frontal encephalomalacia, CKD stage IIIb-IV, insulin-dependent type 2 diabetes, hypertension, hyperlipidemia, seizure disorder, and memory loss who presents to the ED for evaluation of generalized weakness and abnormal labs, potential UTI. Patient recently admitted 11/28/2020-12/03/2020 for AKI on CKD, discharged to SNF.    PT Comments    Patient agreeable to PT. Patient reports she was in dialysis earlier today while sitting in the chair. Patient continues to require significant assistance with mobility. She was able to stand x 2 bouts with maximal assistance of 2 person. Standing tolerance of less 15 seconds. Recommend to continue PT to maximize independence. SNF recommended at discharge.     Follow Up Recommendations  SNF     Equipment Recommendations  None recommended by PT;Other (comment)    Recommendations for Other Services       Precautions / Restrictions Precautions Precautions: Fall Restrictions Weight Bearing Restrictions: No    Mobility  Bed Mobility Overal bed mobility: Needs Assistance Bed Mobility: Supine to Sit;Sit to Supine     Supine to sit: Max assist Sit to supine: Max assist   General bed mobility comments: assistance for LE and trunk support. verbal cues for technique. extra time required to complete tasks    Transfers Overall transfer level: Needs assistance Equipment used: None   Sit to Stand: Max assist;+2 physical assistance         General transfer comment: 2 bouts of standing performed. patient needs increased time and cues for increased independence with transfers. patient able to come to full standing position with facilitation for hip and knee extension  bilaterally  Ambulation/Gait             General Gait Details: patient unable to ambulate due to poor standing tolerance and generalized weakness   Stairs             Wheelchair Mobility    Modified Rankin (Stroke Patients Only)       Balance Overall balance assessment: Needs assistance Sitting-balance support: Single extremity supported;Feet supported Sitting balance-Leahy Scale: Fair     Standing balance support: Bilateral upper extremity supported;During functional activity Standing balance-Leahy Scale: Zero Standing balance comment: maximal assistance for standing balance and loss of balance posteirorly. +2 person assistance reuqired                            Cognition Arousal/Alertness: Awake/alert Behavior During Therapy: Flat affect Overall Cognitive Status: Impaired/Different from baseline                                 General Comments: patient able to follow commands with extra time      Exercises      General Comments        Pertinent Vitals/Pain Pain Assessment: No/denies pain    Home Living                      Prior Function            PT Goals (current goals can now be found in the care plan section) Acute Rehab PT Goals Patient Stated Goal: to get stronger PT Goal Formulation: With patient  Time For Goal Achievement: 01/21/21 Potential to Achieve Goals: Fair Progress towards PT goals: Progressing toward goals    Frequency    Min 2X/week      PT Plan Current plan remains appropriate    Co-evaluation              AM-PAC PT "6 Clicks" Mobility   Outcome Measure  Help needed turning from your back to your side while in a flat bed without using bedrails?: A Lot Help needed moving from lying on your back to sitting on the side of a flat bed without using bedrails?: A Lot Help needed moving to and from a bed to a chair (including a wheelchair)?: Total Help needed standing up from  a chair using your arms (e.g., wheelchair or bedside chair)?: Total Help needed to walk in hospital room?: Total Help needed climbing 3-5 steps with a railing? : Total 6 Click Score: 8    End of Session Equipment Utilized During Treatment: Gait belt Activity Tolerance: Patient limited by fatigue Patient left: in bed;with call bell/phone within reach;with bed alarm set Nurse Communication: Mobility status PT Visit Diagnosis: Other abnormalities of gait and mobility (R26.89);Muscle weakness (generalized) (M62.81)     Time: 2993-7169 PT Time Calculation (min) (ACUTE ONLY): 17 min  Charges:  $Therapeutic Activity: 8-22 mins                     Minna Merritts, PT, MPT    Percell Locus 01/11/2021, 4:09 PM

## 2021-01-12 LAB — CBC WITH DIFFERENTIAL/PLATELET
Abs Immature Granulocytes: 0.05 10*3/uL (ref 0.00–0.07)
Basophils Absolute: 0 10*3/uL (ref 0.0–0.1)
Basophils Relative: 0 %
Eosinophils Absolute: 0.4 10*3/uL (ref 0.0–0.5)
Eosinophils Relative: 6 %
HCT: 25.4 % — ABNORMAL LOW (ref 36.0–46.0)
Hemoglobin: 8.4 g/dL — ABNORMAL LOW (ref 12.0–15.0)
Immature Granulocytes: 1 %
Lymphocytes Relative: 23 %
Lymphs Abs: 1.6 10*3/uL (ref 0.7–4.0)
MCH: 29.8 pg (ref 26.0–34.0)
MCHC: 33.1 g/dL (ref 30.0–36.0)
MCV: 90.1 fL (ref 80.0–100.0)
Monocytes Absolute: 0.9 10*3/uL (ref 0.1–1.0)
Monocytes Relative: 13 %
Neutro Abs: 3.9 10*3/uL (ref 1.7–7.7)
Neutrophils Relative %: 57 %
Platelets: 200 10*3/uL (ref 150–400)
RBC: 2.82 MIL/uL — ABNORMAL LOW (ref 3.87–5.11)
RDW: 15 % (ref 11.5–15.5)
WBC: 6.9 10*3/uL (ref 4.0–10.5)
nRBC: 0 % (ref 0.0–0.2)

## 2021-01-12 LAB — GLUCOSE, CAPILLARY
Glucose-Capillary: 107 mg/dL — ABNORMAL HIGH (ref 70–99)
Glucose-Capillary: 218 mg/dL — ABNORMAL HIGH (ref 70–99)
Glucose-Capillary: 130 mg/dL — ABNORMAL HIGH (ref 70–99)
Glucose-Capillary: 96 mg/dL (ref 70–99)

## 2021-01-12 LAB — RENAL FUNCTION PANEL
Albumin: 2.8 g/dL — ABNORMAL LOW (ref 3.5–5.0)
Anion gap: 10 (ref 5–15)
BUN: 34 mg/dL — ABNORMAL HIGH (ref 8–23)
CO2: 28 mmol/L (ref 22–32)
Calcium: 8.5 mg/dL — ABNORMAL LOW (ref 8.9–10.3)
Chloride: 99 mmol/L (ref 98–111)
Creatinine, Ser: 2.73 mg/dL — ABNORMAL HIGH (ref 0.44–1.00)
GFR, Estimated: 19 mL/min — ABNORMAL LOW (ref >60)
Glucose, Bld: 98 mg/dL (ref 70–99)
Phosphorus: 4.4 mg/dL (ref 2.5–4.6)
Potassium: 3.8 mmol/L (ref 3.5–5.1)
Sodium: 137 mmol/L (ref 135–145)

## 2021-01-12 NOTE — Progress Notes (Signed)
Patient up to recliner with two person assist for approximately two hours today. Transferred back to bed with hoyer lift; tolerated well. Per note, patient up to recliner for dialysis session 6/28.

## 2021-01-12 NOTE — TOC Progression Note (Signed)
Transition of Care [facility]) - Progression Note    Patient Details  Name: Susan House MRN: [id number] Date of Birth: 05-06-1956  Transition of Care [facility]) CM/SW [place], RN Phone Number: 01/12/2021, 8:56 AM  Clinical Narrative: Spoke with [facility], they will be able to accept patient back however an Authorization is needed. FL2 done and Authorization request submitted.     Expected Discharge Plan: [place] Barriers to Discharge: Continued Medical Work up  Expected Discharge Plan and Services Expected Discharge Plan: [place] Acute Care Choice: Resumption of Svcs/PTA Provider Living arrangements for the past 2 months: Single Family Home                                       Social Determinants of Health (SDOH) Interventions    Readmission Risk Interventions No flowsheet data found.

## 2021-01-12 NOTE — Progress Notes (Signed)
PROGRESS NOTE    Susan House  OYD:[id number] DOB: 1956-07-17 DOA: 12/14/2020 PCP: Jerrol Banana., MD   Chief Complaint  Patient presents with   Weakness    Brief Narrative:  65 y.o. female with medical history significant for history of CVA with residual left-sided weakness, left frontal encephalomalacia, CKD stage IIIb-IV, insulin-dependent type 2 diabetes, hypertension, hyperlipidemia, seizure disorder, and memory loss who presents to the ED for evaluation of generalized weakness and abnormal labs, found to have AKI on CKD and evidence of urinary tract infection.  Patient admitted for further management.  Due to worsening kidney function, nephrology consulted, HD was started on 12/31/2020.  Patient still making urine and BP still remains labile.  Assessment & Plan:   Principal Problem:   Acute kidney injury superimposed on chronic kidney disease ([place]) Active Problems:   Hypertension associated with diabetes ([place])   Type 2 diabetes mellitus with complications ([place])   Hemiparesis due to old cerebrovascular accident [facility])   Hyperlipidemia associated with type 2 diabetes mellitus ([place])   Hx of completed stroke   AKI (acute kidney injury) ([place])   Protein-calorie malnutrition, severe  Progression to end-stage kidney disease Started on HD on 6/17, still making good urine output (1300 cc UOP 6/28) Further HD sessions per nephrology  Renal ultrasound with Doppler rule showed 10.8 x 5.6 cm cystic mass on the right side.  CT abdomen pelvis did not show this mass Patient has outpatient HD set up already for T/TH/S Today she mentioned that she may not want long term dialysis.  Discussed with Dr. Candiss Norse who noted expectation that she'll probably need long term dialysis.  I encouraged [name] to take her time and not rush regarding decision given importance.  She was appreciative of this.  Will consider involvement of palliative care as needed.   Acute metabolic  encephalopathy Improved Likely 2/2 ?UTI Vs ??  Postictal state, ??  Seizure Recently completed 3 days of Rocephin for UTI Procalcitonin 1.02-->1.44 UA with large leukocytes, many bacteria, > 50 WBC UC with multiple species BC X 2 NGTD CXR with atelectasis, otherwise unremarkable Completed 5 days of IV Rocephin Code stroke was activated, CT head negative for any acute intracranial abnormality Neurology consulted, appreciate recs, no further recs - suspected encephalopathy due to infection   Hypokalemia Replace as needed   Refractory/labile hypertension  Continue current regimen of amlodipine, clonidine, hydralazine, metoprolol, imdur, torsemide   Anemia of chronic kidney disease and B12 deficiency Hemoglobin stable s/p 1 PRBC Continue B12 supplementation   Insulin-dependent type 2 diabetes, uncontrolled A1c 8.6% SSI, Lantus, NovoLog 3 times daily, Accu-Cheks, hypoglycemic protocol   History of CVA with residual left-sided weakness/left frontal encephalomalacia Continue aspirin, Plavix, atorvastatin.   Seizure disorder Continue Keppra Seizure precaution   Memory loss Suspected underlying vascular dementia.   She had some reported confusion/delirium prior to arrival.   Baseline level of mentation is unclear CT head significant for chronic volume loss and small vessel ischemic changes consistent with suspected vascular dementia Outpatient follow-up   Constipation Continue Senokot-S and MiraLAX  DVT prophylaxis: heparin Code Status: DNR Family Communication: husband at bedside Disposition:   Status is: Inpatient  Remains inpatient appropriate because:Inpatient level of care appropriate due to severity of illness  Dispo: The patient is from: Home              Anticipated d/c is to: SNF              Patient currently  is not medically stable to d/c.   Difficult to place patient No  Consultants:  Renal  Neurology Vascular   Procedures:   6/17 PROCEDURE: Ultrasound guidance for vascular access to the right internal jugular vein Fluoroscopic guidance for placement of catheter Placement of Oseas Detty 19 cm tip to cuff tunneled hemodialysis catheter via the right internal jugular vein  6/8 Renal artery US IMPRESSION: 1. Renal artery velocities within normal limits. 2. Nonvisualization of the abdominal aorta, therefore renal artery-aorta ratios could not be measured. 3. Partially visualized cystic mass noted in the right abdomen measuring 10.8 x 5.6 cm is of uncertain etiology. Further evaluation with CT of the abdomen and pelvis should be performed to better evaluate this structure.   These results will be called to the ordering clinician or representative by the Radiologist Assistant, and communication documented in the PACS or [employer].  Antimicrobials:  Anti-infectives (From admission, onward)    Start     Dose/Rate Route Frequency Ordered Stop   01/06/21 0845  cefTRIAXone (ROCEPHIN) 1 g in sodium chloride 0.9 % 100 mL IVPB  Status:  Discontinued        1 g 200 mL/hr over 30 Minutes Intravenous Every 24 hours 01/06/21 0751 01/10/21 1557   12/31/20 0900  ceFAZolin (ANCEF) powder 1 g  Status:  Discontinued        1 g Other To Surgery 12/31/20 0801 12/31/20 0808   12/31/20 0752  sodium chloride 0.9 % with ceFAZolin (ANCEF) ADS Med       Note to Pharmacy: Corlis Hove   : cabinet override      12/31/20 0752 12/31/20 1959   12/31/20 0749  ceFAZolin (ANCEF) IVPB 2g/100 mL premix  Status:  Discontinued        2 g 200 mL/hr over 30 Minutes Intravenous 30 min pre-op 12/31/20 0749 12/31/20 1003   12/15/20 0800  cefTRIAXone (ROCEPHIN) 1 g in sodium chloride 0.9 % 100 mL IVPB        1 g 200 mL/hr over 30 Minutes Intravenous Every 24 hours 12/15/20 0713 12/17/20 1034       Subjective: No new complaints Seems disappointed no d/c yet  Objective: Vitals:   01/12/21 0422 01/12/21 0535 01/12/21 0650 01/12/21 0710   BP: (!) 147/48 (!) 123/53  (!) 148/50  Pulse: 60 62  62  Resp: 13 16  16   Temp: 98 F (36.7 C) 97.6 F (36.4 C)  97.6 F (36.4 C)  TempSrc: Oral Oral  Oral  SpO2: 100% 99%  100%  Weight:   92.8 kg   Height:        Intake/Output Summary (Last 24 hours) at 01/12/2021 1703 Last data filed at 01/12/2021 1409 Gross per 24 hour  Intake 480 ml  Output 1300 ml  Net -820 ml   Filed Weights   01/10/21 0425 01/11/21 0533 01/12/21 0650  Weight: 92.9 kg 91.7 kg 92.8 kg    Examination:  General exam: Appears calm and comfortable  Respiratory system: Clear to auscultation. Respiratory effort normal. Cardiovascular system: S1 & S2 heard, RRR. Gastrointestinal system: Abdomen is nondistended, soft and nontender Central nervous system: Alert and oriented. No focal neurological deficits. Extremities: no LEE Skin: No rashes, lesions or ulcers Psychiatry: Judgement and insight appear normal. Mood & affect appropriate.     Data Reviewed: I have personally reviewed following labs and imaging studies  CBC: Recent Labs  Lab 01/08/21 0534 01/09/21 0608 01/10/21 0523 01/11/21 0540 01/12/21 0551  WBC 4.9 5.2  5.4 5.8 6.9  NEUTROABS 2.3 2.3 2.5 2.9 3.9  HGB 8.1* 7.9* 7.5* 8.0* 8.4*  HCT 24.6* 24.2* 22.9* 23.8* 25.4*  MCV 89.5 89.3 90.5 88.8 90.1  PLT 176 159 157 180 542    Basic Metabolic Panel: Recent Labs  Lab 01/08/21 0534 01/09/21 0608 01/10/21 0523 01/11/21 0540 01/12/21 0551  NA 134* 132* 133* 136 137  K 4.0 3.5 3.8 3.7 3.8  CL 100 95* 98 99 99  CO2 25 28 29 28 28   GLUCOSE 199* 176* 159* 98 98  BUN 52* 36* 48* 57* 34*  CREATININE 3.75* 2.56* 3.28* 3.49* 2.73*  CALCIUM 8.1* 7.8* 8.0* 8.2* 8.5*  PHOS 5.1* 3.9 5.6* 6.1* 4.4    GFR: Estimated Creatinine Clearance: 22.1 mL/min ([place]) (by C-G formula based on SCr of 2.73 mg/dL (H)).  Liver Function Tests: Recent Labs  Lab 01/05/21 1751 01/06/21 0545 01/08/21 0534 01/09/21 0608 01/10/21 0523 01/11/21 0540  01/12/21 0551  AST 27  --   --   --   --   --   --   ALT 10  --   --   --   --   --   --   ALKPHOS 69  --   --   --   --   --   --   BILITOT 0.6  --   --   --   --   --   --   PROT 6.8  --   --   --   --   --   --   ALBUMIN 3.2*   < > 2.4* 2.5* 2.4* 2.7* 2.8*   < > = values in this interval not displayed.    CBG: Recent Labs  Lab 01/11/21 1619 01/11/21 2121 01/12/21 0812 01/12/21 1150 01/12/21 1637  GLUCAP 237* 194* 107* 218* 130*     Recent Results (from the past 240 hour(s))  Urine Culture     Status: Abnormal   Collection Time: 01/06/21  4:22 AM   Specimen: Urine, Random  Result Value Ref Range Status   Specimen Description   Final    URINE, RANDOM Performed at [facility], [address] [place], [place] [address]    Special Requests   Final    NONE Performed at [facility], [address] [place], [place] [address]    Culture ([place])  Final    70,000 COLONIES/mL MULTIPLE SPECIES PRESENT, SUGGEST RECOLLECTION   Report Status 01/07/2021 FINAL  Final  CULTURE, BLOOD (ROUTINE X 2) w Reflex to ID Panel     Status: None   Collection Time: 01/06/21  8:19 AM   Specimen: BLOOD  Result Value Ref Range Status   Specimen Description BLOOD RIGHT [facility]  Final   Special Requests   Final    BOTTLES DRAWN AEROBIC AND ANAEROBIC Blood Culture adequate volume   Culture   Final    NO GROWTH 5 DAYS Performed at [facility], [address] [place], [place] [address]    Report Status 01/11/2021 FINAL  Final  CULTURE, BLOOD (ROUTINE X 2) w Reflex to ID Panel     Status: None   Collection Time: 01/06/21  8:21 AM   Specimen: BLOOD  Result Value Ref Range Status   Specimen Description BLOOD RIGHT HAND  Final   Special Requests   Final    BOTTLES DRAWN AEROBIC AND ANAEROBIC Blood Culture adequate volume   Culture   Final    NO GROWTH  5 DAYS Performed at [facility], [address] [place], [place] [address]    Report Status 01/11/2021  FINAL  Final         Radiology Studies: No results found.      Scheduled Meds:  amLODipine  10 mg Oral Daily   aspirin  81 mg Oral Daily   atorvastatin  20 mg Oral QHS   Chlorhexidine Gluconate Cloth  6 each Topical Q0600   cloNIDine  0.3 mg Oral BID   clopidogrel  75 mg Oral Daily   epoetin (EPOGEN/PROCRIT) injection  4,000 Units Intravenous Q T,Th,Sa-HD   feeding supplement (NEPRO CARB STEADY)  237 mL Oral BID BM   gabapentin  100 mg Oral QHS   heparin  5,000 Units Subcutaneous Q8H   hydrALAZINE  100 mg Oral Q6H   insulin aspart  0-15 Units Subcutaneous TID AC & HS   insulin aspart  5 Units Subcutaneous TID WC   insulin glargine  10 Units Subcutaneous QHS   isosorbide mononitrate  60 mg Oral Daily   levETIRAcetam  500 mg Oral Q24H   metoprolol tartrate  50 mg Oral BID   multivitamin  1 tablet Oral QHS   nystatin  1 application Topical BID   pantoprazole  40 mg Oral Daily   polyethylene glycol  17 g Oral Daily   senna-docusate  2 tablet Oral BID   torsemide  20 mg Oral Daily   vitamin B-12  250 mcg Oral Daily   Continuous Infusions:  sodium chloride     sodium chloride Stopped (01/06/21 1455)     LOS: 27 days    Time spent: over 30 min    Fayrene Helper, MD [facility]   To contact the attending provider between 7A-7P or the covering provider during after hours 7P-7A, please log into the web site [email or web address] and access using universal [place] password for that web site. If you do not have the password, please call the hospital operator.  01/12/2021, 5:03 PM

## 2021-01-12 NOTE — Progress Notes (Signed)
Patient is accepted at [facility] 10:00am. Clinic would prefer not to start patient on Saturday due to staffing. Patient can start on Tuesday 7/5. Patient will need to have a hoyer pad under for transfers at clinic.

## 2021-01-12 NOTE — Progress Notes (Signed)
[facility]  ROUNDING NOTE   Subjective:   Patient seen working with physical therapy Alert and oriented Tolerating meals Per therapy, patient required 2-person assist to chair Will require lift for transfer at center Room air     Objective:  Vital signs in last 24 hours:  Temp:  [97.6 F (36.4 C)-98.7 F (37.1 C)] 97.6 F (36.4 C) (06/29 0710) Pulse Rate:  [60-70] 62 (06/29 0710) Resp:  [13-16] 16 (06/29 0710) BP: (123-148)/(48-81) 148/50 (06/29 0710) SpO2:  [99 %-100 %] 100 % (06/29 0710) Weight:  [92.8 kg] 92.8 kg (06/29 0650)  Weight change: 1.1 kg Filed Weights   01/10/21 0425 01/11/21 0533 01/12/21 0650  Weight: 92.9 kg 91.7 kg 92.8 kg    Intake/Output: I/O last 3 completed shifts: In: 240 [P.O.:240] Out: 1800 [Urine:1300; Other:500]   Intake/Output this shift:  Total I/O In: 240 [P.O.:240] Out: -   Physical Exam: General: NAD. Seated in chair  Head: Normocephalic, atraumatic.moist oral mucosal membranes  Eyes: Anicteric  Lungs:  Clear , normal effort  Heart: regular  Abdomen:  Soft, nontender, non distended  Extremities:  No  peripheral edema.  Neurologic:  Alert to self and place.   Skin: No acute lesions or rashes  Access: RIJ permcath    Basic Metabolic Panel: Recent Labs  Lab 01/08/21 0534 01/09/21 0608 01/10/21 0523 01/11/21 0540 01/12/21 0551  NA 134* 132* 133* 136 137  K 4.0 3.5 3.8 3.7 3.8  CL 100 95* 98 99 99  CO2 25 28 29 28 28   GLUCOSE 199* 176* 159* 98 98  BUN 52* 36* 48* 57* 34*  CREATININE 3.75* 2.56* 3.28* 3.49* 2.73*  CALCIUM 8.1* 7.8* 8.0* 8.2* 8.5*  PHOS 5.1* 3.9 5.6* 6.1* 4.4     Liver Function Tests: Recent Labs  Lab 01/05/21 1751 01/06/21 0545 01/08/21 0534 01/09/21 0608 01/10/21 0523 01/11/21 0540 01/12/21 0551  AST 27  --   --   --   --   --   --   ALT 10  --   --   --   --   --   --   ALKPHOS 69  --   --   --   --   --   --   BILITOT 0.6  --   --   --   --   --   --   PROT 6.8  --    --   --   --   --   --   ALBUMIN 3.2*   < > 2.4* 2.5* 2.4* 2.7* 2.8*   < > = values in this interval not displayed.    No results for input(s): LIPASE, AMYLASE in the last 168 hours. No results for input(s): AMMONIA in the last 168 hours.  CBC: Recent Labs  Lab 01/08/21 0534 01/09/21 0608 01/10/21 0523 01/11/21 0540 01/12/21 0551  WBC 4.9 5.2 5.4 5.8 6.9  NEUTROABS 2.3 2.3 2.5 2.9 3.9  HGB 8.1* 7.9* 7.5* 8.0* 8.4*  HCT 24.6* 24.2* 22.9* 23.8* 25.4*  MCV 89.5 89.3 90.5 88.8 90.1  PLT 176 159 157 180 200     Cardiac Enzymes: No results for input(s): CKTOTAL, CKMB, CKMBINDEX, TROPONINI in the last 168 hours.  BNP: Invalid input(s): POCBNP  CBG: Recent Labs  Lab 01/11/21 0736 01/11/21 1219 01/11/21 1619 01/11/21 2121 01/12/21 0812  GLUCAP 105* 135* 237* 194* 107*     Microbiology: Results for orders placed or performed during the hospital encounter of 12/14/20  SARS CORONAVIRUS 2 (TAT 6-24 HRS) Nasopharyngeal Nasopharyngeal Swab     Status: None   Collection Time: 12/14/20  8:28 PM   Specimen: Nasopharyngeal Swab  Result Value Ref Range Status   SARS Coronavirus 2 NEGATIVE NEGATIVE Final    Comment: (NOTE) SARS-CoV-2 target nucleic acids are NOT DETECTED.  The SARS-CoV-2 RNA is generally detectable in upper and lower respiratory specimens during the acute phase of infection. Negative results do not preclude SARS-CoV-2 infection, do not rule out co-infections with other pathogens, and should not be used as the sole basis for treatment or other patient management decisions. Negative results must be combined with clinical observations, patient history, and epidemiological information. The expected result is Negative.  Fact Sheet for Patients: SugarRoll.be  Fact Sheet for Healthcare Providers: https://www.woods-mathews.com/  This test is not yet approved or cleared by the [place] FDA and  has been authorized  for detection and/or diagnosis of SARS-CoV-2 by FDA under an Emergency Use Authorization (EUA). This EUA will remain  in effect (meaning this test can be used) for the duration of the COVID-19 declaration under Se ction 564(b)(1) of the Act, 21 U.S.C. section 360bbb-3(b)(1), unless the authorization is terminated or revoked sooner.  Performed at [facility], [place] [address], [place], [place] [address]   SARS CORONAVIRUS 2 (TAT 6-24 HRS) Nasopharyngeal Nasopharyngeal Swab     Status: None   Collection Time: 12/19/20  4:40 PM   Specimen: Nasopharyngeal Swab  Result Value Ref Range Status   SARS Coronavirus 2 NEGATIVE NEGATIVE Final    Comment: (NOTE) SARS-CoV-2 target nucleic acids are NOT DETECTED.  The SARS-CoV-2 RNA is generally detectable in upper and lower respiratory specimens during the acute phase of infection. Negative results do not preclude SARS-CoV-2 infection, do not rule out co-infections with other pathogens, and should not be used as the sole basis for treatment or other patient management decisions. Negative results must be combined with clinical observations, patient history, and epidemiological information. The expected result is Negative.  Fact Sheet for Patients: SugarRoll.be  Fact Sheet for Healthcare Providers: https://www.woods-mathews.com/  This test is not yet approved or cleared by the [place] FDA and  has been authorized for detection and/or diagnosis of SARS-CoV-2 by FDA under an Emergency Use Authorization (EUA). This EUA will remain  in effect (meaning this test can be used) for the duration of the COVID-19 declaration under Se ction 564(b)(1) of the Act, 21 U.S.C. section 360bbb-3(b)(1), unless the authorization is terminated or revoked sooner.  Performed at [facility], [place] [address], [place], [place] [address]   Urine Culture     Status: Abnormal   Collection Time: 01/06/21  4:22  AM   Specimen: Urine, Random  Result Value Ref Range Status   Specimen Description   Final    URINE, RANDOM Performed at [facility], [address] [place], [place] [address]    Special Requests   Final    NONE Performed at [facility], [address] [place], [place] [address]    Culture (A)  Final    70,000 COLONIES/mL MULTIPLE SPECIES PRESENT, SUGGEST RECOLLECTION   Report Status 01/07/2021 FINAL  Final  CULTURE, BLOOD (ROUTINE X 2) w Reflex to ID Panel     Status: None   Collection Time: 01/06/21  8:19 AM   Specimen: BLOOD  Result Value Ref Range Status   Specimen Description BLOOD RIGHT [facility]  Final   Special Requests   Final  BOTTLES DRAWN AEROBIC AND ANAEROBIC Blood Culture adequate volume   Culture   Final    NO GROWTH 5 DAYS Performed at [facility], [address] [place], [place] [address]    Report Status 01/11/2021 FINAL  Final  CULTURE, BLOOD (ROUTINE X 2) w Reflex to ID Panel     Status: None   Collection Time: 01/06/21  8:21 AM   Specimen: BLOOD  Result Value Ref Range Status   Specimen Description BLOOD RIGHT HAND  Final   Special Requests   Final    BOTTLES DRAWN AEROBIC AND ANAEROBIC Blood Culture adequate volume   Culture   Final    NO GROWTH 5 DAYS Performed at [facility], [address], [place], [place] [address]    Report Status 01/11/2021 FINAL  Final    Coagulation Studies: No results for input(s): LABPROT, INR in the last 72 hours.  Urinalysis: No results for input(s): COLORURINE, LABSPEC, PHURINE, GLUCOSEU, HGBUR, BILIRUBINUR, KETONESUR, PROTEINUR, UROBILINOGEN, NITRITE, LEUKOCYTESUR in the last 72 hours.  Invalid input(s): APPERANCEUR     Imaging: No results found.   Medications:    sodium chloride     sodium chloride Stopped (01/06/21 1455)    amLODipine  10 mg Oral Daily   aspirin  81 mg Oral Daily   atorvastatin  20 mg Oral QHS   Chlorhexidine Gluconate Cloth  6 each Topical  Q0600   cloNIDine  0.3 mg Oral BID   clopidogrel  75 mg Oral Daily   epoetin (EPOGEN/PROCRIT) injection  4,000 Units Intravenous Q T,Th,Sa-HD   feeding supplement (NEPRO CARB STEADY)  237 mL Oral BID BM   gabapentin  100 mg Oral QHS   heparin  5,000 Units Subcutaneous Q8H   hydrALAZINE  100 mg Oral Q6H   insulin aspart  0-15 Units Subcutaneous TID AC & HS   insulin aspart  5 Units Subcutaneous TID WC   insulin glargine  10 Units Subcutaneous QHS   isosorbide mononitrate  60 mg Oral Daily   levETIRAcetam  500 mg Oral Q24H   metoprolol tartrate  50 mg Oral BID   multivitamin  1 tablet Oral QHS   nystatin  1 application Topical BID   pantoprazole  40 mg Oral Daily   polyethylene glycol  17 g Oral Daily   senna-docusate  2 tablet Oral BID   torsemide  20 mg Oral Daily   vitamin B-12  250 mcg Oral Daily   sodium chloride, sodium chloride, acetaminophen **OR** acetaminophen, alteplase, heparin, hydrALAZINE, HYDROcodone-acetaminophen, hydrocortisone, iohexol, lidocaine (PF), lidocaine-prilocaine, ondansetron **OR** ondansetron (ZOFRAN) IV, pentafluoroprop-tetrafluoroeth, senna-docusate  Assessment/ Plan:  [name] is a 65 y.o. white female with diabetes mellitus type II, hypertension, CVA with residual left hemiplegia, hyperlipidemia, congestive heart failure who was admitted to [facility] on 12/14/2020 for AKI (acute kidney injury) ([place]) [N17.9] Altered mental status, unspecified altered mental status type [R41.82] Acute kidney injury superimposed on chronic kidney disease ([place]) [G18.2, X93.7]  Complicated and prolonged hospitalization. 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  Recent acute kidney injury secondary to ATN, prerenal azotemia and uncontrolled hypertension. Inconsistent recovery and results of 24 hours urine creatinine clearance indicate ESRD Initiated on dialysis on 12/31/20 - Received dialysis  yesterday seated in recliner, tolerated well -Next treatment scheduled for Thursday - Outpatient arranged for [facility], TTS. Dialysis coordinator notified outpatient clinic of need for lift for transfers.  Lab Results  Component  Value Date   CREATININE 2.73 (H) 01/12/2021   CREATININE 3.49 (H) 01/11/2021   CREATININE 3.28 (H) 01/10/2021    Intake/Output Summary (Last 24 hours) at 01/12/2021 1147 Last data filed at 01/12/2021 1018 Gross per 24 hour  Intake 480 ml  Output 1300 ml  Net -820 ml     2. Hypertension: 140s/50s. Volume status at goal.   Current regimen of amlodipine, clonidine, hydralazine, isosorbide mononitrate, metoprolol and torsemide.  3. 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  Hgb A1c 8.6% on 11/29/20 Ssi managed by primary team  4. Anemia of CKD with iron deficiency.  Lab Results  Component Value Date   HGB 8.4 (L) 01/12/2021  - given IV iron on 6/19.  - will continue to monitor - EPO 4000 units with dialysis treatments   LOS: [place] 6/29/202211:47 AM

## 2021-01-12 NOTE — Evaluation (Signed)
Occupational Therapy Evaluation Patient Details Name: Susan House MRN: [id number] DOB: May 01, 1956 Today's Date: 01/12/2021    History of Present Illness Pt is a 65 y.o. female with medical history significant for history of CVA with residual left-sided weakness, left frontal encephalomalacia, CKD stage IIIb-IV, insulin-dependent type 2 diabetes, hypertension, hyperlipidemia, seizure disorder, and memory loss who presents to the ED for evaluation of generalized weakness and abnormal labs, potential UTI. Patient recently admitted 11/28/2020-12/03/2020 for AKI on CKD, discharged to SNF.   Clinical Impression   Pt seen for OT re-evaluation on this date in setting of prolonged hospitalization. Upon arrival to room, pt awake in bed, finishing breakfast. Pt with improved cognition this date and able to recall HD session yesterday. Pt agreeable to OOB mobility with OT. Pt required MAX A for bed mobility, MAX A+2 for squat pivot transfer (with arm rest of recliner lowered), MIN A for seated UB dressing, and SUPERVISION for seated grooming tasks. Pt is making good progress towards goals and 1/4 goals has been met and updated. Anticipate pt will be able to meet remaining goals with continue medical work-up. Pt continues to benefit from skilled OT services to maximize return to PLOF and minimize risk of future falls, injury, caregiver burden, and readmission. Will continue to follow POC. Discharge recommendation remains appropriate.      Follow Up Recommendations  SNF;Supervision/Assistance - 24 hour    Equipment Recommendations  Other (comment) (defer to next venue of care)       Precautions / Restrictions Precautions Precautions: Fall Precaution Comments: L hemi Restrictions Weight Bearing Restrictions: No      Mobility Bed Mobility Overal bed mobility: Needs Assistance Bed Mobility: Supine to Sit     Supine to sit: Max assist          Transfers Overall transfer level: Needs  assistance Equipment used: None Transfers: Sit to/from [employer] Sit to Stand: Max assist;+2 physical assistance   Squat pivot transfers: Max assist;+2 physical assistance          Balance Overall balance assessment: Needs assistance Sitting-balance support: Single extremity supported;Feet supported Sitting balance-Leahy Scale: Fair Sitting balance - Comments: supervision for static sititing at EOB   Standing balance support: Bilateral upper extremity supported;During functional activity Standing balance-Leahy Scale: Zero Standing balance comment: maximal assistance for standing balance, +2 person assistance required                           ADL either performed or assessed with clinical judgement   ADL Overall ADL's : Needs assistance/impaired     Grooming: Wash/dry face;Supervision/safety;Set up;Sitting           Upper Body Dressing : Minimal assistance;Sitting Upper Body Dressing Details (indicate cue type and reason): to don/doff hospital gown Lower Body Dressing: Maximal assistance;Sitting/lateral leans Lower Body Dressing Details (indicate cue type and reason): to adjust socks             Functional mobility during ADLs: Moderate assistance;+2 for safety/equipment;Rolling walker                    Pertinent Vitals/Pain          Extremity/Trunk Assessment Upper Extremity Assessment Upper Extremity Assessment: Generalized weakness;LUE deficits/detail LUE Deficits / Details: Chronic LUE weakness from CVA LUE Sensation: WNL   Lower Extremity Assessment Lower Extremity Assessment: Generalized weakness;LLE deficits/detail LLE Deficits / Details: Chronic LLE weakness from CVA  Cognition Arousal/Alertness: Awake/alert Behavior During Therapy: Flat affect Overall Cognitive Status: Impaired/Different from baseline                                 General Comments: Pt with improved cognition this  date and able to recall HD session yesterday. Pt fatigues quickly and requires multimodal cues for functional mobility      Exercises General Exercises - Upper Extremity Shoulder Flexion: AAROM;Both;10 reps;Seated Elbow Flexion: AAROM;Both;10 reps;Seated General Exercises - Lower Extremity Ankle Circles/Pumps: AROM;Strengthening;Both;10 reps;Seated Long Arc Quad: AROM;Strengthening;Both;20 reps;Seated Other Exercises Other Exercises: pt performed x10 reps of scapula elevation and retraction          OT Problem List: Decreased strength;Impaired balance (sitting and/or standing);Decreased knowledge of precautions;Decreased safety awareness;Cardiopulmonary status limiting activity;Decreased activity tolerance;Decreased knowledge of use of DME or AE;Decreased range of motion      OT Treatment/Interventions: Self-care/ADL training;Manual therapy;Therapeutic exercise;Patient/family education;Balance training;Energy conservation;Therapeutic activities;Cognitive remediation/compensation;DME and/or AE instruction    OT Goals(Current goals can be found in the care plan section) Acute Rehab OT Goals Patient Stated Goal: to get stronger OT Goal Formulation: With patient Time For Goal Achievement: 01/26/21 Potential to Achieve Goals: Fair ADL Goals Pt Will Perform Grooming: with modified independence;sitting  OT Frequency: Min 2X/week    AM-PAC OT "6 Clicks" Daily Activity     Outcome Measure Help from another person eating meals?: A Little Help from another person taking care of personal grooming?: A Little Help from another person toileting, which includes using toliet, bedpan, or urinal?: Total Help from another person bathing (including washing, rinsing, drying)?: A Lot Help from another person to put on and taking off regular upper body clothing?: A Lot Help from another person to put on and taking off regular lower body clothing?: A Lot 6 Click Score: 13   End of Session Equipment  Utilized During Treatment: Gait belt Nurse Communication: Mobility status  Activity Tolerance: Patient tolerated treatment well Patient left: in chair;with call bell/phone within reach;with chair alarm set  OT Visit Diagnosis: Unsteadiness on feet (R26.81);Muscle weakness (generalized) (M62.81)                Time: 0174-9449 OT Time Calculation (min): 38 min Charges:  OT General Charges $OT Visit: 1 Visit OT Evaluation $OT Re-eval: 1 Re-eval OT Treatments $Self Care/Home Management : 23-37 mins  Fredirick Maudlin, OTR/L [place]
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
## 2021-01-13 LAB — CBC WITH DIFFERENTIAL/PLATELET
Abs Immature Granulocytes: 0.07 10*3/uL (ref 0.00–0.07)
Basophils Absolute: 0 10*3/uL (ref 0.0–0.1)
Basophils Relative: 1 %
Eosinophils Absolute: 0.4 10*3/uL (ref 0.0–0.5)
Eosinophils Relative: 6 %
HCT: 23.8 % — ABNORMAL LOW (ref 36.0–46.0)
Hemoglobin: 7.9 g/dL — ABNORMAL LOW (ref 12.0–15.0)
Immature Granulocytes: 1 %
Lymphocytes Relative: 26 %
Lymphs Abs: 1.6 10*3/uL (ref 0.7–4.0)
MCH: 30.3 pg (ref 26.0–34.0)
MCHC: 33.2 g/dL (ref 30.0–36.0)
MCV: 91.2 fL (ref 80.0–100.0)
Monocytes Absolute: 0.7 10*3/uL (ref 0.1–1.0)
Monocytes Relative: 12 %
Neutro Abs: 3.3 10*3/uL (ref 1.7–7.7)
Neutrophils Relative %: 54 %
Platelets: 197 10*3/uL (ref 150–400)
RBC: 2.61 MIL/uL — ABNORMAL LOW (ref 3.87–5.11)
RDW: 15.3 % (ref 11.5–15.5)
WBC: 6 10*3/uL (ref 4.0–10.5)
nRBC: 0 % (ref 0.0–0.2)

## 2021-01-13 LAB — COMPREHENSIVE METABOLIC PANEL
ALT: 15 U/L (ref 0–44)
AST: 24 U/L (ref 15–41)
Albumin: 2.7 g/dL — ABNORMAL LOW (ref 3.5–5.0)
Alkaline Phosphatase: 63 U/L (ref 38–126)
Anion gap: 7 (ref 5–15)
BUN: 45 mg/dL — ABNORMAL HIGH (ref 8–23)
CO2: 28 mmol/L (ref 22–32)
Calcium: 8.1 mg/dL — ABNORMAL LOW (ref 8.9–10.3)
Chloride: 101 mmol/L (ref 98–111)
Creatinine, Ser: 3.43 mg/dL — ABNORMAL HIGH (ref 0.44–1.00)
GFR, Estimated: 14 mL/min — ABNORMAL LOW (ref >60)
Glucose, Bld: 261 mg/dL — ABNORMAL HIGH (ref 70–99)
Potassium: 4 mmol/L (ref 3.5–5.1)
Sodium: 136 mmol/L (ref 135–145)
Total Bilirubin: 0.6 mg/dL (ref 0.3–1.2)
Total Protein: 5.4 g/dL — ABNORMAL LOW (ref 6.5–8.1)

## 2021-01-13 LAB — RESP PANEL BY RT-PCR (FLU A&B, COVID) ARPGX2
Influenza A by PCR: NEGATIVE
Influenza B by PCR: NEGATIVE
SARS Coronavirus 2 by RT PCR: NEGATIVE

## 2021-01-13 LAB — GLUCOSE, CAPILLARY
Glucose-Capillary: 215 mg/dL — ABNORMAL HIGH (ref 70–99)
Glucose-Capillary: 159 mg/dL — ABNORMAL HIGH (ref 70–99)
Glucose-Capillary: 206 mg/dL — ABNORMAL HIGH (ref 70–99)

## 2021-01-13 LAB — PHOSPHORUS: Phosphorus: 5.4 mg/dL — ABNORMAL HIGH (ref 2.5–4.6)

## 2021-01-13 LAB — MAGNESIUM: Magnesium: 2.3 mg/dL (ref 1.7–2.4)

## 2021-01-13 MED ORDER — HYDRALAZINE HCL 25 MG PO TABS
25.0000 mg | ORAL_TABLET | Freq: Once | ORAL | Status: AC
  Administered 2021-01-13: 25 mg via ORAL
  Filled 2021-01-13: qty 1

## 2021-01-13 MED ORDER — LEVETIRACETAM 250 MG PO TABS
250.0000 mg | ORAL_TABLET | Freq: Once | ORAL | Status: AC
  Administered 2021-01-13: 250 mg via ORAL
  Filled 2021-01-13: qty 1

## 2021-01-13 MED ORDER — EPOETIN ALFA 4000 UNIT/ML IJ SOLN
4000.0000 [IU] | INTRAMUSCULAR | Status: DC
  Administered 2021-01-13 – 2021-01-18 (×2): 4000 [IU] via INTRAVENOUS
  Filled 2021-01-13 (×5): qty 1

## 2021-01-13 NOTE — Progress Notes (Signed)
Spoke with Shae in central telemetry at 0921 to transfer patient from room 203 to [facility].

## 2021-01-13 NOTE — Progress Notes (Signed)
PROGRESS NOTE    Susan House  LKG:[id number] DOB: 19-Jan-1956 DOA: 12/14/2020 PCP: Jerrol Banana., MD   Chief Complaint  Patient presents with   Weakness    Brief Narrative:  65 y.o. female with medical history significant for history of CVA with residual left-sided weakness, left frontal encephalomalacia, CKD stage IIIb-IV, insulin-dependent type 2 diabetes, hypertension, hyperlipidemia, seizure disorder, and memory loss who presents to the ED for evaluation of generalized weakness and abnormal labs, found to have AKI on CKD and evidence of urinary tract infection.  Patient admitted for further management.  Due to worsening kidney function, nephrology consulted, HD was started on 12/31/2020.  Patient still making urine and BP still remains labile.  Stable for discharge at this time, unfortunately, can't be new start dialysis over weekend.  Facility unable to take her until Wednesday (July 6).   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  CT abdomen pelvis did not show this mass Patient has outpatient HD set up already for T/TH/S - unable to d/c today unfortunately, can't be new start on weekend 6/29 she mentioned that she may not want long term dialysis.  Discussed with Dr. Candiss Norse who noted expectation that she'll probably need long term  dialysis.  I encouraged Susan House to take her time and not rush regarding decision given importance.  She was appreciative of this.  Encouraged continued discussion of this with family and renal going forward.   Acute metabolic encephalopathy Improved Likely 2/2 ?UTI Vs ??  Postictal state, ??  Seizure Recently completed 3 days of Rocephin for UTI Procalcitonin 1.02-->1.44 UA with large leukocytes, many bacteria, > 50 WBC UC with multiple species BC X 2 NGTD CXR with atelectasis, otherwise unremarkable Completed 5 days of IV Rocephin Code stroke was activated, CT head negative for any acute intracranial abnormality Neurology consulted, appreciate recs, no further recs - suspected encephalopathy due to infection   Hypokalemia Replace as needed   Refractory/labile hypertension  Continue current regimen of amlodipine, clonidine, hydralazine, metoprolol, imdur, torsemide   Anemia of chronic kidney disease and B12 deficiency Hemoglobin stable s/p 1 PRBC Continue B12 supplementation   Insulin-dependent type 2 diabetes, uncontrolled A1c 8.6% SSI, Lantus, NovoLog 3 times daily, Accu-Cheks, hypoglycemic protocol   History of CVA with residual left-sided weakness/left frontal encephalomalacia Continue aspirin, Plavix, atorvastatin.   Seizure disorder Continue Keppra Seizure precaution   Memory loss Suspected underlying vascular dementia.   She had some reported confusion/delirium prior to arrival.   Baseline level of mentation is unclear CT head significant for chronic volume loss and small vessel ischemic changes consistent with suspected vascular dementia Outpatient follow-up   Constipation Continue Senokot-S and MiraLAX  DVT prophylaxis: heparin Code Status: DNR Family Communication: husband at bedside Disposition:   Status is: Inpatient  Remains inpatient appropriate because:Inpatient level of care appropriate due to severity of  illness  Dispo: The patient is from:  Home              Anticipated d/c is to: SNF              Patient currently is not medically stable to d/c.   Difficult to place patient No  Consultants:  Renal  Neurology Vascular   Procedures:  6/17 PROCEDURE: Ultrasound guidance for vascular access to the right internal jugular vein Fluoroscopic guidance for placement of catheter Placement of Silverio Hagan 19 cm tip to cuff tunneled hemodialysis catheter via the right internal jugular vein  6/8 Renal artery US IMPRESSION: 1. Renal artery velocities within normal limits. 2. Nonvisualization of the abdominal aorta, therefore renal artery-aorta ratios could not be measured. 3. Partially visualized cystic mass noted in the right abdomen measuring 10.8 x 5.6 cm is of uncertain etiology. Further evaluation with CT of the abdomen and pelvis should be performed to better evaluate this structure.   These results will be called to the ordering clinician or representative by the Radiologist Assistant, and communication documented in the PACS or [employer].  Antimicrobials:  Anti-infectives (From admission, onward)    Start     Dose/Rate Route Frequency Ordered Stop   01/06/21 0845  cefTRIAXone (ROCEPHIN) 1 g in sodium chloride 0.9 % 100 mL IVPB  Status:  Discontinued        1 g 200 mL/hr over 30 Minutes Intravenous Every 24 hours 01/06/21 0751 01/10/21 1557   12/31/20 0900  ceFAZolin (ANCEF) powder 1 g  Status:  Discontinued        1 g Other To Surgery 12/31/20 0801 12/31/20 0808   12/31/20 0752  sodium chloride 0.9 % with ceFAZolin (ANCEF) ADS Med       Note to Pharmacy: Corlis Hove   : cabinet override      12/31/20 0752 12/31/20 1959   12/31/20 0749  ceFAZolin (ANCEF) IVPB 2g/100 mL premix  Status:  Discontinued        2 g 200 mL/hr over 30 Minutes Intravenous 30 min pre-op 12/31/20 0749 12/31/20 1003   12/15/20 0800  cefTRIAXone (ROCEPHIN) 1 g in sodium chloride 0.9 % 100 mL IVPB        1 g 200 mL/hr over 30 Minutes  Intravenous Every 24 hours 12/15/20 0713 12/17/20 1034       Subjective: No new complaints  Objective: Vitals:   01/13/21 1215 01/13/21 1230 01/13/21 1245 01/13/21 1520  BP: (!) 148/65 (!) 156/71 (!) 166/58 (!) 153/53  Pulse:    72  Resp: 11 11 17 15   Temp:    98.8 F (37.1 C)  TempSrc:    Oral  SpO2:    97%  Weight:      Height:        Intake/Output Summary (Last 24 hours) at 01/13/2021 1823 Last data filed at 01/13/2021 1230 Gross per 24 hour  Intake 240 ml  Output 1750 ml  Net -1510 ml   Filed Weights   01/11/21 0533 01/12/21 0650 01/13/21 0500  Weight: 91.7 kg 92.8 kg 94.7 kg    Examination:  General: No acute distress. Cardiovascular: Heart sounds show Zerrick Hanssen regular rate, and rhythm.  Lungs: Clear to auscultation bilaterally  Abdomen: Soft, nontender, nondistended  Neurological: Alert and oriented 3. Moves all extremities 4 . Cranial nerves II through XII grossly intact. Skin: Warm and dry. No rashes or lesions. Extremities: No clubbing or cyanosis. No edema.   Data Reviewed: I have personally  reviewed following labs and imaging studies  CBC: Recent Labs  Lab 01/09/21 0608 01/10/21 0523 01/11/21 0540 01/12/21 0551 01/13/21 0411  WBC 5.2 5.4 5.8 6.9 6.0  NEUTROABS 2.3 2.5 2.9 3.9 3.3  HGB 7.9* 7.5* 8.0* 8.4* 7.9*  HCT 24.2* 22.9* 23.8* 25.4* 23.8*  MCV 89.3 90.5 88.8 90.1 91.2  PLT 159 157 180 200 193    Basic Metabolic Panel: Recent Labs  Lab 01/09/21 0608 01/10/21 0523 01/11/21 0540 01/12/21 0551 01/13/21 0411  NA 132* 133* 136 137 136  K 3.5 3.8 3.7 3.8 4.0  CL 95* 98 99 99 101  CO2 28 29 28 28 28   GLUCOSE 176* 159* 98 98 261*  BUN 36* 48* 57* 34* 45*  CREATININE 2.56* 3.28* 3.49* 2.73* 3.43*  CALCIUM 7.8* 8.0* 8.2* 8.5* 8.1*  MG  --   --   --   --  2.3  PHOS 3.9 5.6* 6.1* 4.4 5.4*    GFR: Estimated Creatinine Clearance: 17.8 mL/min (Christphor Groft) (by C-G formula based on SCr of 3.43 mg/dL (H)).  Liver Function Tests: Recent Labs  Lab  01/09/21 0608 01/10/21 0523 01/11/21 0540 01/12/21 0551 01/13/21 0411  AST  --   --   --   --  24  ALT  --   --   --   --  15  ALKPHOS  --   --   --   --  63  BILITOT  --   --   --   --  0.6  PROT  --   --   --   --  5.4*  ALBUMIN 2.5* 2.4* 2.7* 2.8* 2.7*    CBG: Recent Labs  Lab 01/12/21 1150 01/12/21 1637 01/12/21 2044 01/13/21 0738 01/13/21 1323  GLUCAP 218* 130* 96 215* 159*     Recent Results (from the past 240 hour(s))  Urine Culture     Status: Abnormal   Collection Time: 01/06/21  4:22 AM   Specimen: Urine, Random  Result Value Ref Range Status   Specimen Description   Final    URINE, RANDOM Performed at [facility], [address] [place], [place] [address]    Special Requests   Final    NONE Performed at [facility], [address] [place], [place] [address]    Culture (Maria Coin)  Final    70,000 COLONIES/mL MULTIPLE SPECIES PRESENT, SUGGEST RECOLLECTION   Report Status 01/07/2021 FINAL  Final  CULTURE, BLOOD (ROUTINE X 2) w Reflex to ID Panel     Status: None   Collection Time: 01/06/21  8:19 AM   Specimen: BLOOD  Result Value Ref Range Status   Specimen Description BLOOD RIGHT [facility]  Final   Special Requests   Final    BOTTLES DRAWN AEROBIC AND ANAEROBIC Blood Culture adequate volume   Culture   Final    NO GROWTH 5 DAYS Performed at [facility], [address] [place], [place] [address]    Report Status 01/11/2021 FINAL  Final  CULTURE, BLOOD (ROUTINE X 2) w Reflex to ID Panel     Status: None   Collection Time: 01/06/21  8:21 AM   Specimen: BLOOD  Result Value Ref Range Status   Specimen Description BLOOD RIGHT HAND  Final   Special Requests   Final    BOTTLES DRAWN AEROBIC AND ANAEROBIC Blood Culture adequate volume   Culture   Final    NO GROWTH 5 DAYS Performed at [facility], [place]  [address] [place], [place] [address]    Report Status 01/11/2021 FINAL  Final  Resp Panel by RT-PCR (Flu [place]na Legore&B, Covid)  Nasopharyngeal Swab     Status: None   Collection Time: 01/13/21  1:32 PM   Specimen: Nasopharyngeal Swab; Nasopharyngeal(NP) swabs in vial transport medium  Result Value Ref Range Status   SARS Coronavirus 2 by RT PCR NEGATIVE NEGATIVE Final    Comment: (NOTE) SARS-CoV-2 target nucleic acids are NOT DETECTED.  The SARS-CoV-2 RNA is generally detectable in upper respiratory specimens during the acute phase of infection. The lowest concentration of SARS-CoV-2 viral copies this assay can detect is 138 copies/mL. Arminta Gamm negative result does not preclude SARS-Cov-2 infection and should not be used as the sole basis for treatment or other patient management decisions. Zoejane Gaulin negative result may occur with  improper specimen collection/handling, submission of specimen other than nasopharyngeal swab, presence of viral mutation(s) within the areas targeted by this assay, and inadequate number of viral copies(<138 copies/mL). Carolene Gitto negative result must be combined with clinical observations, patient history, and epidemiological information. The expected result is Negative.  Fact Sheet for Patients:  EntrepreneurPulse.com.au  Fact Sheet for Healthcare Providers:  IncredibleEmployment.be  This test is no t yet approved or cleared by the [place] FDA and  has been authorized for detection and/or diagnosis of SARS-CoV-2 by FDA under an Emergency Use Authorization (EUA). This EUA will remain  in effect (meaning this test can be used) for the duration of the COVID-19 declaration under Section 564(b)(1) of the Act, 21 U.S.C.section 360bbb-3(b)(1), unless the authorization is terminated  or revoked sooner.       Influenza Janece Laidlaw by PCR NEGATIVE NEGATIVE Final   Influenza B by PCR NEGATIVE NEGATIVE Final    Comment: (NOTE) The Xpert Xpress SARS-CoV-2/FLU/RSV plus assay is intended as an aid in the diagnosis of influenza from Nasopharyngeal swab specimens and should not be  used as Marisal Swarey sole basis for treatment. Nasal washings and aspirates are unacceptable for Xpert Xpress SARS-CoV-2/FLU/RSV testing.  Fact Sheet for Patients: EntrepreneurPulse.com.au  Fact Sheet for Healthcare Providers: IncredibleEmployment.be  This test is not yet approved or cleared by the [place] FDA and has been authorized for detection and/or diagnosis of SARS-CoV-2 by FDA under an Emergency Use Authorization (EUA). This EUA will remain in effect (meaning this test can be used) for the duration of the COVID-19 declaration under Section 564(b)(1) of the Act, 21 U.S.C. section 360bbb-3(b)(1), unless the authorization is terminated or revoked.  Performed at [facility], [address], [place], [place] [address]          Radiology Studies: No results found.      Scheduled Meds:  amLODipine  10 mg Oral Daily   aspirin  81 mg Oral Daily   atorvastatin  20 mg Oral QHS   Chlorhexidine Gluconate Cloth  6 each Topical Q0600   cloNIDine  0.3 mg Oral BID   clopidogrel  75 mg Oral Daily   epoetin (EPOGEN/PROCRIT) injection  4,000 Units Intravenous Q T,Th,Sa-HD   feeding supplement (NEPRO CARB STEADY)  237 mL Oral BID BM   gabapentin  100 mg Oral QHS   heparin  5,000 Units Subcutaneous Q8H   hydrALAZINE  100 mg Oral Q6H   insulin aspart  0-15 Units Subcutaneous TID AC & HS   insulin aspart  5 Units Subcutaneous TID WC   insulin glargine  10 Units Subcutaneous QHS   isosorbide mononitrate  60 mg Oral Daily   levETIRAcetam  500 mg Oral Q24H   metoprolol tartrate  50 mg Oral BID   multivitamin  1 tablet Oral QHS   nystatin  1 application Topical BID   pantoprazole  40 mg Oral Daily   polyethylene glycol  17 g Oral Daily   senna-docusate  2 tablet Oral BID   torsemide  20 mg Oral Daily   vitamin B-12  250 mcg Oral Daily   Continuous Infusions:  sodium chloride     sodium chloride Stopped (01/06/21 1455)     LOS: 28  days    Time spent: over 30 min    Fayrene Helper, MD [facility]   To contact the attending provider between 7A-7P or the covering provider during after hours 7P-7A, please log into the web site [email or web address] and access using universal [place] password for that web site. If you do not have the password, please call the hospital operator.  01/13/2021, 6:23 PM

## 2021-01-13 NOTE — Progress Notes (Signed)
Patient tolerated well, no concerns. CVC functioned without incident, maintained prescribed BFR. Fld. removed 1 liter. Patient in dialysis chair for treatment. VSS.

## 2021-01-13 NOTE — Progress Notes (Signed)
[facility]  ROUNDING NOTE   Subjective:   Patient seen during dialysis   HEMODIALYSIS FLOWSHEET:  Blood Flow Rate (mL/min): 300 mL/min Arterial Pressure (mmHg): -100 mmHg Venous Pressure (mmHg): 70 mmHg Transmembrane Pressure (mmHg): 70 mmHg Ultrafiltration Rate (mL/min): 500 mL/min Dialysate Flow Rate (mL/min): 500 ml/min Conductivity: Machine : 13.7 Conductivity: Machine : 13.7 Dialysis Fluid Bolus: Normal Saline Bolus Amount (mL): 250 mL  No complaints at this time Tolerating meals, denies nausea Denies shortness of breath   Objective:  Vital signs in last 24 hours:  Temp:  [98.2 F (36.8 C)-98.5 F (36.9 C)] 98.2 F (36.8 C) (06/30 0929) Pulse Rate:  [62-70] 68 (06/30 0929) Resp:  [0-18] 12 (06/30 1115) BP: (128-166)/(51-77) 166/63 (06/30 1115) SpO2:  [99 %] 99 % (06/30 0738) Weight:  [94.7 kg] 94.7 kg (06/30 0500)  Weight change: 1.9 kg Filed Weights   01/11/21 0533 01/12/21 0650 01/13/21 0500  Weight: 91.7 kg 92.8 kg 94.7 kg    Intake/Output: I/O last 3 completed shifts: In: 720 [P.O.:720] Out: 2050 [Urine:2050]   Intake/Output this shift:  No intake/output data recorded.  Physical Exam: General: NAD. Seated in chair  Head: Normocephalic, atraumatic.moist oral mucosal membranes  Eyes: Anicteric  Lungs:  Clear , normal effort  Heart: regular  Abdomen:  Soft, nontender, non distended  Extremities:  No  peripheral edema.  Neurologic:  Alert and oriented  Skin: No acute lesions or rashes  Access: RIJ permcath    Basic Metabolic Panel: Recent Labs  Lab 01/09/21 0608 01/10/21 0523 01/11/21 0540 01/12/21 0551 01/13/21 0411  NA 132* 133* 136 137 136  K 3.5 3.8 3.7 3.8 4.0  CL 95* 98 99 99 101  CO2 28 29 28 28 28   GLUCOSE 176* 159* 98 98 261*  BUN 36* 48* 57* 34* 45*  CREATININE 2.56* 3.28* 3.49* 2.73* 3.43*  CALCIUM 7.8* 8.0* 8.2* 8.5* 8.1*  MG  --   --   --   --  2.3  PHOS 3.9 5.6* 6.1* 4.4 5.4*     Liver Function  Tests: Recent Labs  Lab 01/09/21 0608 01/10/21 0523 01/11/21 0540 01/12/21 0551 01/13/21 0411  AST  --   --   --   --  24  ALT  --   --   --   --  15  ALKPHOS  --   --   --   --  63  BILITOT  --   --   --   --  0.6  PROT  --   --   --   --  5.4*  ALBUMIN 2.5* 2.4* 2.7* 2.8* 2.7*    No results for input(s): LIPASE, AMYLASE in the last 168 hours. No results for input(s): AMMONIA in the last 168 hours.  CBC: Recent Labs  Lab 01/09/21 0608 01/10/21 0523 01/11/21 0540 01/12/21 0551 01/13/21 0411  WBC 5.2 5.4 5.8 6.9 6.0  NEUTROABS 2.3 2.5 2.9 3.9 3.3  HGB 7.9* 7.5* 8.0* 8.4* 7.9*  HCT 24.2* 22.9* 23.8* 25.4* 23.8*  MCV 89.3 90.5 88.8 90.1 91.2  PLT 159 157 180 200 197     Cardiac Enzymes: No results for input(s): CKTOTAL, CKMB, CKMBINDEX, TROPONINI in the last 168 hours.  BNP: Invalid input(s): POCBNP  CBG: Recent Labs  Lab 01/12/21 0812 01/12/21 1150 01/12/21 1637 01/12/21 2044 01/13/21 0738  GLUCAP 107* 218* 130* 96 215*     Microbiology: Results for orders placed or performed during the hospital encounter of 12/14/20  SARS CORONAVIRUS 2 (TAT 6-24 HRS) Nasopharyngeal Nasopharyngeal Swab     Status: None   Collection Time: 12/14/20  8:28 PM   Specimen: Nasopharyngeal Swab  Result Value Ref Range Status   SARS Coronavirus 2 NEGATIVE NEGATIVE Final    Comment: (NOTE) SARS-CoV-2 target nucleic acids are NOT DETECTED.  The SARS-CoV-2 RNA is generally detectable in upper and lower respiratory specimens during the acute phase of infection. Negative results do not preclude SARS-CoV-2 infection, do not rule out co-infections with other pathogens, and should not be used as the sole basis for treatment or other patient management decisions. Negative results must be combined with clinical observations, patient history, and epidemiological information. The expected result is Negative.  Fact Sheet for Patients: SugarRoll.be  Fact  Sheet for Healthcare Providers: https://www.woods-mathews.com/  This test is not yet approved or cleared by the [place] FDA and  has been authorized for detection and/or diagnosis of SARS-CoV-2 by FDA under an Emergency Use Authorization (EUA). This EUA will remain  in effect (meaning this test can be used) for the duration of the COVID-19 declaration under Se ction 564(b)(1) of the Act, 21 U.S.C. section 360bbb-3(b)(1), unless the authorization is terminated or revoked sooner.  Performed at [facility], [place] [address], [place], [place] [address]   SARS CORONAVIRUS 2 (TAT 6-24 HRS) Nasopharyngeal Nasopharyngeal Swab     Status: None   Collection Time: 12/19/20  4:40 PM   Specimen: Nasopharyngeal Swab  Result Value Ref Range Status   SARS Coronavirus 2 NEGATIVE NEGATIVE Final    Comment: (NOTE) SARS-CoV-2 target nucleic acids are NOT DETECTED.  The SARS-CoV-2 RNA is generally detectable in upper and lower respiratory specimens during the acute phase of infection. Negative results do not preclude SARS-CoV-2 infection, do not rule out co-infections with other pathogens, and should not be used as the sole basis for treatment or other patient management decisions. Negative results must be combined with clinical observations, patient history, and epidemiological information. The expected result is Negative.  Fact Sheet for Patients: SugarRoll.be  Fact Sheet for Healthcare Providers: https://www.woods-mathews.com/  This test is not yet approved or cleared by the [place] FDA and  has been authorized for detection and/or diagnosis of SARS-CoV-2 by FDA under an Emergency Use Authorization (EUA). This EUA will remain  in effect (meaning this test can be used) for the duration of the COVID-19 declaration under Se ction 564(b)(1) of the Act, 21 U.S.C. section 360bbb-3(b)(1), unless the authorization is terminated  or revoked sooner.  Performed at [facility], [place] [address] [place], [place] [address]   Urine Culture     Status: Abnormal   Collection Time: 01/06/21  4:22 AM   Specimen: Urine, Random  Result Value Ref Range Status   Specimen Description   Final    URINE, RANDOM Performed at [facility], [address], [place], [place] [address]    Special Requests   Final    NONE Performed at [facility], [address] [place], [place] [address]    Culture (A)  Final    70,000 COLONIES/mL MULTIPLE SPECIES PRESENT, SUGGEST RECOLLECTION   Report Status 01/07/2021 FINAL  Final  CULTURE, BLOOD (ROUTINE X 2) w Reflex to ID Panel     Status: None   Collection Time: 01/06/21  8:19 AM   Specimen: BLOOD  Result Value Ref Range Status   Specimen Description BLOOD RIGHT [facility]  Final   Special Requests   Final  BOTTLES DRAWN AEROBIC AND ANAEROBIC Blood Culture adequate volume   Culture   Final    NO GROWTH 5 DAYS Performed at [facility], [address] [place], [place] [address]    Report Status 01/11/2021 FINAL  Final  CULTURE, BLOOD (ROUTINE X 2) w Reflex to ID Panel     Status: None   Collection Time: 01/06/21  8:21 AM   Specimen: BLOOD  Result Value Ref Range Status   Specimen Description BLOOD RIGHT HAND  Final   Special Requests   Final    BOTTLES DRAWN AEROBIC AND ANAEROBIC Blood Culture adequate volume   Culture   Final    NO GROWTH 5 DAYS Performed at [facility], [address], [place], [place] [address]    Report Status 01/11/2021 FINAL  Final    Coagulation Studies: No results for input(s): LABPROT, INR in the last 72 hours.  Urinalysis: No results for input(s): COLORURINE, LABSPEC, PHURINE, GLUCOSEU, HGBUR, BILIRUBINUR, KETONESUR, PROTEINUR, UROBILINOGEN, NITRITE, LEUKOCYTESUR in the last 72 hours.  Invalid input(s): APPERANCEUR     Imaging: No results found.   Medications:    sodium chloride     sodium  chloride Stopped (01/06/21 1455)    amLODipine  10 mg Oral Daily   aspirin  81 mg Oral Daily   atorvastatin  20 mg Oral QHS   Chlorhexidine Gluconate Cloth  6 each Topical Q0600   cloNIDine  0.3 mg Oral BID   clopidogrel  75 mg Oral Daily   epoetin (EPOGEN/PROCRIT) injection  4,000 Units Intravenous Q T,Th,Sa-HD   feeding supplement (NEPRO CARB STEADY)  237 mL Oral BID BM   gabapentin  100 mg Oral QHS   heparin  5,000 Units Subcutaneous Q8H   hydrALAZINE  100 mg Oral Q6H   insulin aspart  0-15 Units Subcutaneous TID AC & HS   insulin aspart  5 Units Subcutaneous TID WC   insulin glargine  10 Units Subcutaneous QHS   isosorbide mononitrate  60 mg Oral Daily   levETIRAcetam  500 mg Oral Q24H   metoprolol tartrate  50 mg Oral BID   multivitamin  1 tablet Oral QHS   nystatin  1 application Topical BID   pantoprazole  40 mg Oral Daily   polyethylene glycol  17 g Oral Daily   senna-docusate  2 tablet Oral BID   torsemide  20 mg Oral Daily   vitamin B-12  250 mcg Oral Daily   sodium chloride, sodium chloride, acetaminophen **OR** acetaminophen, alteplase, heparin, hydrALAZINE, HYDROcodone-acetaminophen, hydrocortisone, iohexol, lidocaine (PF), lidocaine-prilocaine, ondansetron **OR** ondansetron (ZOFRAN) IV, pentafluoroprop-tetrafluoroeth, senna-docusate  Assessment/ Plan:  [name] is a 65 y.o. white female with diabetes mellitus type II, hypertension, CVA with residual left hemiplegia, hyperlipidemia, congestive heart failure who was admitted to [facility] [place] on 12/14/2020 for AKI (acute kidney injury) ([place]) [N17.9] Altered mental status, unspecified altered mental status type [R41.82] Acute kidney injury superimposed on chronic kidney disease ([place]) [V56.4, P32.9]  Complicated and prolonged hospitalization. 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  Recent acute kidney injury secondary to ATN, prerenal azotemia  and uncontrolled hypertension. Inconsistent recovery and results of 24 hours urine creatinine clearance indicate ESRD Initiated on dialysis on 12/31/20 - Receiving dialysis today seated in recliner - tolerating this well -Next treatment scheduled for Saturday - Outpatient arranged for [facility], TTS. Dialysis coordinator notified outpatient clinic of need for lift for transfers.  Lab Results  Component Value Date   CREATININE 3.43 (H) 01/13/2021   CREATININE 2.73 (H) 01/12/2021   CREATININE 3.49 (H) 01/11/2021    Intake/Output Summary (Last 24 hours) at 01/13/2021 1129 Last data filed at 01/12/2021 2100 Gross per 24 hour  Intake 480 ml  Output 750 ml  Net -270 ml     2. Hypertension: 160s/60s. Volume status at goal.   Current regimen of amlodipine, clonidine, hydralazine, isosorbide mononitrate, metoprolol and torsemide.  3. 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  Hgb A1c 8.6% on 11/29/20 Ssi managed by primary team  4. Anemia of CKD with iron deficiency.  Lab Results  Component Value Date   HGB 7.9 (L) 01/13/2021  - given IV iron on 6/19.  - will continue to monitor - EPO 4000 units with dialysis treatments   LOS: 28   6/30/202211:29 AM

## 2021-01-14 LAB — CBC WITH DIFFERENTIAL/PLATELET
Abs Immature Granulocytes: 0.07 10*3/uL (ref 0.00–0.07)
Basophils Absolute: 0.1 10*3/uL (ref 0.0–0.1)
Basophils Relative: 1 %
Eosinophils Absolute: 0.4 10*3/uL (ref 0.0–0.5)
Eosinophils Relative: 5 %
HCT: 25.9 % — ABNORMAL LOW (ref 36.0–46.0)
Hemoglobin: 8.4 g/dL — ABNORMAL LOW (ref 12.0–15.0)
Immature Granulocytes: 1 %
Lymphocytes Relative: 25 %
Lymphs Abs: 1.9 10*3/uL (ref 0.7–4.0)
MCH: 29.7 pg (ref 26.0–34.0)
MCHC: 32.4 g/dL (ref 30.0–36.0)
MCV: 91.5 fL (ref 80.0–100.0)
Monocytes Absolute: 0.9 10*3/uL (ref 0.1–1.0)
Monocytes Relative: 12 %
Neutro Abs: 4.2 10*3/uL (ref 1.7–7.7)
Neutrophils Relative %: 56 %
Platelets: 221 10*3/uL (ref 150–400)
RBC: 2.83 MIL/uL — ABNORMAL LOW (ref 3.87–5.11)
RDW: 15.2 % (ref 11.5–15.5)
WBC: 7.4 10*3/uL (ref 4.0–10.5)
nRBC: 0 % (ref 0.0–0.2)

## 2021-01-14 LAB — COMPREHENSIVE METABOLIC PANEL
ALT: 14 U/L (ref 0–44)
AST: 25 U/L (ref 15–41)
Albumin: 2.9 g/dL — ABNORMAL LOW (ref 3.5–5.0)
Alkaline Phosphatase: 61 U/L (ref 38–126)
Anion gap: 8 (ref 5–15)
BUN: 29 mg/dL — ABNORMAL HIGH (ref 8–23)
CO2: 30 mmol/L (ref 22–32)
Calcium: 8.5 mg/dL — ABNORMAL LOW (ref 8.9–10.3)
Chloride: 98 mmol/L (ref 98–111)
Creatinine, Ser: 2.21 mg/dL — ABNORMAL HIGH (ref 0.44–1.00)
GFR, Estimated: 24 mL/min — ABNORMAL LOW (ref >60)
Glucose, Bld: 100 mg/dL — ABNORMAL HIGH (ref 70–99)
Potassium: 3.8 mmol/L (ref 3.5–5.1)
Sodium: 136 mmol/L (ref 135–145)
Total Bilirubin: 0.6 mg/dL (ref 0.3–1.2)
Total Protein: 5.8 g/dL — ABNORMAL LOW (ref 6.5–8.1)

## 2021-01-14 LAB — GLUCOSE, CAPILLARY
Glucose-Capillary: 269 mg/dL — ABNORMAL HIGH (ref 70–99)
Glucose-Capillary: 111 mg/dL — ABNORMAL HIGH (ref 70–99)
Glucose-Capillary: 207 mg/dL — ABNORMAL HIGH (ref 70–99)
Glucose-Capillary: 181 mg/dL — ABNORMAL HIGH (ref 70–99)
Glucose-Capillary: 176 mg/dL — ABNORMAL HIGH (ref 70–99)

## 2021-01-14 LAB — MAGNESIUM: Magnesium: 2.1 mg/dL (ref 1.7–2.4)

## 2021-01-14 LAB — PHOSPHORUS: Phosphorus: 4.2 mg/dL (ref 2.5–4.6)

## 2021-01-14 NOTE — Progress Notes (Signed)
   01/14/21 1500  Clinical Encounter Type  Visited With Patient and family together  Visit Type Follow-up;Spiritual support;Social support  Referral From Chaplain  Consult/Referral To [place] did a follow up visit with PT and her husband. The husband was able to express his concerns,  and spoke of how grateful he was for the care provided to his wife. Chaplain engaged patient in conversation to help promote self-expression. Chaplain validated her feelings and encouraged her that she was well cared for. Chaplain offered supportive presence and offered emotional support.

## 2021-01-14 NOTE — TOC Progression Note (Addendum)
Transition of Care [facility]) - Progression Note    Patient Details  Name: Susan House MRN: [id number] Date of Birth: 07-05-1956  Transition of Care [facility]) CM/SW [place], LCSW Phone Number: 01/14/2021, 8:40 AM  Clinical Narrative:   [facility], patient's auth approved until 7/5. Patient's COVID test on 6/30 was negative. Per notes, plan for [facility]. Reached out to [place] at [facility] to confirm, she reported they cannot take patient over the weekend, would need to be next week - Tuesday or Wednesday. Updated MD. Josem Kaufmann dates will need to be updated if patient does not go to SNF on 7/5.    Expected Discharge Plan: [place] Barriers to Discharge: Continued Medical Work up  Expected Discharge Plan and Services Expected Discharge Plan: [place] Acute Care Choice: Resumption of Svcs/PTA Provider Living arrangements for the past 2 months: Single Family Home                                       Social Determinants of Health (SDOH) Interventions    Readmission Risk Interventions No flowsheet data found.

## 2021-01-14 NOTE — Progress Notes (Signed)
[facility]  ROUNDING NOTE   Subjective:   Patient sitting up in bed, breakfast at bedside Tolerating meals, denies nausea Denies shortness of breath   Objective:  Vital signs in last 24 hours:  Temp:  [97.5 F (36.4 C)-98.8 F (37.1 C)] 97.7 F (36.5 C) (07/01 0747) Pulse Rate:  [63-72] 68 (07/01 0747) Resp:  [9-18] 18 (07/01 0747) BP: (123-166)/(41-71) 135/49 (07/01 0747) SpO2:  [97 %-100 %] 98 % (07/01 0747)  Weight change:  Filed Weights   01/11/21 0533 01/12/21 0650 01/13/21 0500  Weight: 91.7 kg 92.8 kg 94.7 kg    Intake/Output: I/O last 3 completed shifts: In: -  Out: 1750 [Urine:750; Other:1000]   Intake/Output this shift:  Total I/O In: 120 [P.O.:120] Out: -   Physical Exam: General: NAD. Sitting up in bed  Head: Normocephalic, atraumatic.moist oral mucosal membranes  Eyes: Anicteric  Lungs:  Clear , normal effort  Heart: regular  Abdomen:  Soft, nontender, non distended  Extremities:  No  peripheral edema.  Neurologic:  Alert and oriented  Skin: No acute lesions or rashes  Access: RIJ permcath    Basic Metabolic Panel: Recent Labs  Lab 01/10/21 0523 01/11/21 0540 01/12/21 0551 01/13/21 0411 01/14/21 0507  NA 133* 136 137 136 136  K 3.8 3.7 3.8 4.0 3.8  CL 98 99 99 101 98  CO2 29 28 28 28 30   GLUCOSE 159* 98 98 261* 100*  BUN 48* 57* 34* 45* 29*  CREATININE 3.28* 3.49* 2.73* 3.43* 2.21*  CALCIUM 8.0* 8.2* 8.5* 8.1* 8.5*  MG  --   --   --  2.3 2.1  PHOS 5.6* 6.1* 4.4 5.4* 4.2     Liver Function Tests: Recent Labs  Lab 01/10/21 0523 01/11/21 0540 01/12/21 0551 01/13/21 0411 01/14/21 0507  AST  --   --   --  24 25  ALT  --   --   --  15 14  ALKPHOS  --   --   --  63 61  BILITOT  --   --   --  0.6 0.6  PROT  --   --   --  5.4* 5.8*  ALBUMIN 2.4* 2.7* 2.8* 2.7* 2.9*    No results for input(s): LIPASE, AMYLASE in the last 168 hours. No results for input(s): AMMONIA in the last 168 hours.  CBC: Recent Labs   Lab 01/10/21 0523 01/11/21 0540 01/12/21 0551 01/13/21 0411 01/14/21 0507  WBC 5.4 5.8 6.9 6.0 7.4  NEUTROABS 2.5 2.9 3.9 3.3 4.2  HGB 7.5* 8.0* 8.4* 7.9* 8.4*  HCT 22.9* 23.8* 25.4* 23.8* 25.9*  MCV 90.5 88.8 90.1 91.2 91.5  PLT 157 180 200 197 221     Cardiac Enzymes: No results for input(s): CKTOTAL, CKMB, CKMBINDEX, TROPONINI in the last 168 hours.  BNP: Invalid input(s): POCBNP  CBG: Recent Labs  Lab 01/13/21 0738 01/13/21 1323 01/13/21 1721 01/13/21 2035 01/14/21 0746  GLUCAP 215* 159* 269* 206* 111*     Microbiology: Results for orders placed or performed during the hospital encounter of 12/14/20  SARS CORONAVIRUS 2 (TAT 6-24 HRS) Nasopharyngeal Nasopharyngeal Swab     Status: None   Collection Time: 12/14/20  8:28 PM   Specimen: Nasopharyngeal Swab  Result Value Ref Range Status   SARS Coronavirus 2 NEGATIVE NEGATIVE Final    Comment: (NOTE) SARS-CoV-2 target nucleic acids are NOT DETECTED.  The SARS-CoV-2 RNA is generally detectable in upper and lower respiratory specimens during the acute phase  of infection. Negative results do not preclude SARS-CoV-2 infection, do not rule out co-infections with other pathogens, and should not be used as the sole basis for treatment or other patient management decisions. Negative results must be combined with clinical observations, patient history, and epidemiological information. The expected result is Negative.  Fact Sheet for Patients: SugarRoll.be  Fact Sheet for Healthcare Providers: https://www.woods-mathews.com/  This test is not yet approved or cleared by the [place] FDA and  has been authorized for detection and/or diagnosis of SARS-CoV-2 by FDA under an Emergency Use Authorization (EUA). This EUA will remain  in effect (meaning this test can be used) for the duration of the COVID-19 declaration under Se ction 564(b)(1) of the Act, 21 U.S.C. section  360bbb-3(b)(1), unless the authorization is terminated or revoked sooner.  Performed at [facility], [place] [address], [place], [place] [address]   SARS CORONAVIRUS 2 (TAT 6-24 HRS) Nasopharyngeal Nasopharyngeal Swab     Status: None   Collection Time: 12/19/20  4:40 PM   Specimen: Nasopharyngeal Swab  Result Value Ref Range Status   SARS Coronavirus 2 NEGATIVE NEGATIVE Final    Comment: (NOTE) SARS-CoV-2 target nucleic acids are NOT DETECTED.  The SARS-CoV-2 RNA is generally detectable in upper and lower respiratory specimens during the acute phase of infection. Negative results do not preclude SARS-CoV-2 infection, do not rule out co-infections with other pathogens, and should not be used as the sole basis for treatment or other patient management decisions. Negative results must be combined with clinical observations, patient history, and epidemiological information. The expected result is Negative.  Fact Sheet for Patients: SugarRoll.be  Fact Sheet for Healthcare Providers: https://www.woods-mathews.com/  This test is not yet approved or cleared by the [place] FDA and  has been authorized for detection and/or diagnosis of SARS-CoV-2 by FDA under an Emergency Use Authorization (EUA). This EUA will remain  in effect (meaning this test can be used) for the duration of the COVID-19 declaration under Se ction 564(b)(1) of the Act, 21 U.S.C. section 360bbb-3(b)(1), unless the authorization is terminated or revoked sooner.  Performed at [facility], [place] [address] [place], [place] [address]   Urine Culture     Status: Abnormal   Collection Time: 01/06/21  4:22 AM   Specimen: Urine, Random  Result Value Ref Range Status   Specimen Description   Final    URINE, RANDOM Performed at [facility], [address], [place], [place] [address]    Special Requests   Final    NONE Performed at [facility], [address] [place], [place] [address]    Culture (A)  Final    70,000 COLONIES/mL MULTIPLE SPECIES PRESENT, SUGGEST RECOLLECTION   Report Status 01/07/2021 FINAL  Final  CULTURE, BLOOD (ROUTINE X 2) w Reflex to ID Panel     Status: None   Collection Time: 01/06/21  8:19 AM   Specimen: BLOOD  Result Value Ref Range Status   Specimen Description BLOOD RIGHT [facility]  Final   Special Requests   Final    BOTTLES DRAWN AEROBIC AND ANAEROBIC Blood Culture adequate volume   Culture   Final    NO GROWTH 5 DAYS Performed at [facility], [address] [place],  [address]    Report Status 01/11/2021 FINAL  Final  CULTURE, BLOOD (ROUTINE X 2) w Reflex to ID Panel     Status: None   Collection Time: 01/06/21  8:21 AM  Specimen: BLOOD  Result Value Ref Range Status   Specimen Description BLOOD RIGHT HAND  Final   Special Requests   Final    BOTTLES DRAWN AEROBIC AND ANAEROBIC Blood Culture adequate volume   Culture   Final    NO GROWTH 5 DAYS Performed at [facility], [address] [place], [place] [address]    Report Status 01/11/2021 FINAL  Final  Resp Panel by RT-PCR (Flu A&B, Covid) Nasopharyngeal Swab     Status: None   Collection Time: 01/13/21  1:32 PM   Specimen: Nasopharyngeal Swab; Nasopharyngeal(NP) swabs in vial transport medium  Result Value Ref Range Status   SARS Coronavirus 2 by RT PCR NEGATIVE NEGATIVE Final    Comment: (NOTE) SARS-CoV-2 target nucleic acids are NOT DETECTED.  The SARS-CoV-2 RNA is generally detectable in upper respiratory specimens during the acute phase of infection. The lowest concentration of SARS-CoV-2 viral copies this assay can detect is 138 copies/mL. A negative result does not preclude SARS-Cov-2 infection and should not be used as the sole basis for treatment or other patient management decisions. A negative result may occur with  improper specimen collection/handling, submission of specimen other than  nasopharyngeal swab, presence of viral mutation(s) within the areas targeted by this assay, and inadequate number of viral copies(<138 copies/mL). A negative result must be combined with clinical observations, patient history, and epidemiological information. The expected result is Negative.  Fact Sheet for Patients:  EntrepreneurPulse.com.au  Fact Sheet for Healthcare Providers:  IncredibleEmployment.be  This test is no t yet approved or cleared by the [place] FDA and  has been authorized for detection and/or diagnosis of SARS-CoV-2 by FDA under an Emergency Use Authorization (EUA). This EUA will remain  in effect (meaning this test can be used) for the duration of the COVID-19 declaration under Section 564(b)(1) of the Act, 21 U.S.C.section 360bbb-3(b)(1), unless the authorization is terminated  or revoked sooner.       Influenza A by PCR NEGATIVE NEGATIVE Final   Influenza B by PCR NEGATIVE NEGATIVE Final    Comment: (NOTE) The Xpert Xpress SARS-CoV-2/FLU/RSV plus assay is intended as an aid in the diagnosis of influenza from Nasopharyngeal swab specimens and should not be used as a sole basis for treatment. Nasal washings and aspirates are unacceptable for Xpert Xpress SARS-CoV-2/FLU/RSV testing.  Fact Sheet for Patients: EntrepreneurPulse.com.au  Fact Sheet for Healthcare Providers: IncredibleEmployment.be  This test is not yet approved or cleared by the [place] FDA and has been authorized for detection and/or diagnosis of SARS-CoV-2 by FDA under an Emergency Use Authorization (EUA). This EUA will remain in effect (meaning this test can be used) for the duration of the COVID-19 declaration under Section 564(b)(1) of the Act, 21 U.S.C. section 360bbb-3(b)(1), unless the authorization is terminated or revoked.  Performed at [facility], [address] [place], [place]  [address]     Coagulation Studies: No results for input(s): LABPROT, INR in the last 72 hours.  Urinalysis: No results for input(s): COLORURINE, LABSPEC, PHURINE, GLUCOSEU, HGBUR, BILIRUBINUR, KETONESUR, PROTEINUR, UROBILINOGEN, NITRITE, LEUKOCYTESUR in the last 72 hours.  Invalid input(s): APPERANCEUR     Imaging: No results found.   Medications:    sodium chloride     sodium chloride Stopped (01/06/21 1455)    amLODipine  10 mg Oral Daily   aspirin  81 mg Oral Daily   atorvastatin  20 mg Oral QHS   Chlorhexidine Gluconate Cloth  6 each Topical Q0600  cloNIDine  0.3 mg Oral BID   clopidogrel  75 mg Oral Daily   epoetin (EPOGEN/PROCRIT) injection  4,000 Units Intravenous Q T,Th,Sa-HD   feeding supplement (NEPRO CARB STEADY)  237 mL Oral BID BM   gabapentin  100 mg Oral QHS   heparin  5,000 Units Subcutaneous Q8H   hydrALAZINE  100 mg Oral Q6H   insulin aspart  0-15 Units Subcutaneous TID AC & HS   insulin aspart  5 Units Subcutaneous TID WC   insulin glargine  10 Units Subcutaneous QHS   isosorbide mononitrate  60 mg Oral Daily   levETIRAcetam  500 mg Oral Q24H   metoprolol tartrate  50 mg Oral BID   multivitamin  1 tablet Oral QHS   nystatin  1 application Topical BID   pantoprazole  40 mg Oral Daily   polyethylene glycol  17 g Oral Daily   senna-docusate  2 tablet Oral BID   torsemide  20 mg Oral Daily   vitamin B-12  250 mcg Oral Daily   sodium chloride, sodium chloride, acetaminophen **OR** acetaminophen, alteplase, heparin, hydrALAZINE, HYDROcodone-acetaminophen, hydrocortisone, iohexol, lidocaine (PF), lidocaine-prilocaine, ondansetron **OR** ondansetron (ZOFRAN) IV, pentafluoroprop-tetrafluoroeth, senna-docusate  Assessment/ Plan:  [name] is a 65 y.o. white female with diabetes mellitus type II, hypertension, CVA with residual left hemiplegia, hyperlipidemia, congestive heart failure who was admitted to [facility] on 12/14/2020 for AKI (acute kidney  injury) ([place]) [N17.9] Altered mental status, unspecified altered mental status type [R41.82] Acute kidney injury superimposed on chronic kidney disease ([place]) [O27.0, J50.0]  Complicated and prolonged hospitalization. 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  Recent acute kidney injury secondary to ATN, prerenal azotemia and uncontrolled hypertension. Inconsistent recovery and results of 24 hours urine creatinine clearance indicate ESRD Initiated on dialysis on 12/31/20 - Received dialysis yesterday in chair - Tolerated well, UF goal 500 ml acheived -Next treatment scheduled for Saturday - Outpatient arranged for [facility], TTS. Dialysis coordinator notified outpatient clinic of need for lift for transfers.  Lab Results  Component Value Date   CREATININE 2.21 (H) 01/14/2021   CREATININE 3.43 (H) 01/13/2021   CREATININE 2.73 (H) 01/12/2021    Intake/Output Summary (Last 24 hours) at 01/14/2021 1138 Last data filed at 01/14/2021 0900 Gross per 24 hour  Intake 120 ml  Output 1000 ml  Net -880 ml     2. Hypertension: 160s/60s. Volume status at goal.   Current regimen of amlodipine, clonidine, hydralazine, isosorbide mononitrate, metoprolol and torsemide.  3. 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  Hgb A1c 8.6% on 11/29/20 Ssi managed by primary team  4. Anemia of CKD with iron deficiency.  Lab Results  Component Value Date   HGB 8.4 (L) 01/14/2021   - will continue to monitor - EPO 4000 units with dialysis treatments   LOS: [place] 7/1/202211:38 AM

## 2021-01-14 NOTE — Progress Notes (Signed)
PROGRESS NOTE    Susan House  ZDG:[id number] DOB: 1956/03/31 DOA: 12/14/2020 PCP: Jerrol Banana., MD   Chief Complaint  Patient presents with   Weakness    Brief Narrative:  65 y.o. female with medical history significant for history of CVA with residual left-sided weakness, left frontal encephalomalacia, CKD stage IIIb-IV, insulin-dependent type 2 diabetes, hypertension, hyperlipidemia, seizure disorder, and memory loss who presents to the ED for evaluation of generalized weakness and abnormal labs, found to have AKI on CKD and evidence of urinary tract infection.  Patient admitted for further management.  Due to worsening kidney function, nephrology consulted, HD was started on 12/31/2020.  Patient still making urine and BP still remains labile.  Stable for discharge at this time, unfortunately, can't be new start dialysis over weekend.  Facility unable to take her until Wednesday (July 6).   Assessment & Plan:   Principal Problem:   Acute kidney injury superimposed on chronic kidney disease ([place]) Active Problems:   Hypertension associated with diabetes ([place])   Type 2 diabetes mellitus with complications ([place])   Hemiparesis due to old cerebrovascular accident [facility])   Hyperlipidemia associated with type 2 diabetes mellitus ([place])   Hx of completed stroke   AKI (acute kidney injury) ([place])   Protein-calorie malnutrition, severe  Progression to end-stage kidney disease Started on HD on 6/17, still making decent urine output (750 cc UOP 6/28) Further HD sessions per nephrology  Renal ultrasound with Doppler rule showed 10.8 x 5.6 cm cystic mass on the right side.  CT abdomen pelvis did not show this mass Patient has outpatient HD set up already for T/TH/S - unable to d/c today unfortunately, can't be new start on weekend 6/29 she mentioned that she may not want long term dialysis.  Discussed with Dr. Candiss Norse who noted expectation that she'll probably need long term  dialysis.  I encouraged Susan House to take her time and not rush regarding decision given importance.  She was appreciative of this.  Encouraged continued discussion of this with family and renal going forward.   Acute metabolic encephalopathy Improved Likely 2/2 ?UTI Vs ??  Postictal state, ??  Seizure Recently completed 3 days of Rocephin for UTI Procalcitonin 1.02-->1.44 UA with large leukocytes, many bacteria, > 50 WBC UC with multiple species BC X 2 NGTD CXR with atelectasis, otherwise unremarkable Completed 5 days of IV Rocephin Code stroke was activated, CT head negative for any acute intracranial abnormality Neurology consulted, appreciate recs, no further recs - suspected encephalopathy due to infection   Hypokalemia Replace as needed   Refractory/labile hypertension  Continue current regimen of amlodipine, clonidine, hydralazine, metoprolol, imdur, torsemide   Anemia of chronic kidney disease and B12 deficiency Hemoglobin stable s/p 1 PRBC Continue B12 supplementation   Insulin-dependent type 2 diabetes, uncontrolled A1c 8.6% SSI, Lantus, NovoLog 3 times daily, Accu-Cheks, hypoglycemic protocol   History of CVA with residual left-sided weakness/left frontal encephalomalacia Continue aspirin, Plavix, atorvastatin.   Seizure disorder Continue Keppra Seizure precaution   Memory loss Suspected underlying vascular dementia.   She had some reported confusion/delirium prior to arrival.   Baseline level of mentation is unclear CT head significant for chronic volume loss and small vessel ischemic changes consistent with suspected vascular dementia Outpatient follow-up   Constipation Continue Senokot-S and MiraLAX  DVT prophylaxis: heparin Code Status: DNR Family Communication: husband at bedside Disposition:   Status is: Inpatient  Remains inpatient appropriate because:Inpatient level of care appropriate due to severity of  illness  Dispo: The patient is from:  Home              Anticipated d/c is to: SNF              Patient currently is not medically stable to d/c.   Difficult to place patient No  Consultants:  Renal  Neurology Vascular   Procedures:  6/17 PROCEDURE: Ultrasound guidance for vascular access to the right internal jugular vein Fluoroscopic guidance for placement of catheter Placement of Susan House 19 cm tip to cuff tunneled hemodialysis catheter via the right internal jugular vein  6/8 Renal artery US IMPRESSION: 1. Renal artery velocities within normal limits. 2. Nonvisualization of the abdominal aorta, therefore renal artery-aorta ratios could not be measured. 3. Partially visualized cystic mass noted in the right abdomen measuring 10.8 x 5.6 cm is of uncertain etiology. Further evaluation with CT of the abdomen and pelvis should be performed to better evaluate this structure.   These results will be called to the ordering clinician or representative by the Radiologist Assistant, and communication documented in the PACS or [employer].  Antimicrobials:  Anti-infectives (From admission, onward)    Start     Dose/Rate Route Frequency Ordered Stop   01/06/21 0845  cefTRIAXone (ROCEPHIN) 1 g in sodium chloride 0.9 % 100 mL IVPB  Status:  Discontinued        1 g 200 mL/hr over 30 Minutes Intravenous Every 24 hours 01/06/21 0751 01/10/21 1557   12/31/20 0900  ceFAZolin (ANCEF) powder 1 g  Status:  Discontinued        1 g Other To Surgery 12/31/20 0801 12/31/20 0808   12/31/20 0752  sodium chloride 0.9 % with ceFAZolin (ANCEF) ADS Med       Note to Pharmacy: Corlis Hove   : cabinet override      12/31/20 0752 12/31/20 1959   12/31/20 0749  ceFAZolin (ANCEF) IVPB 2g/100 mL premix  Status:  Discontinued        2 g 200 mL/hr over 30 Minutes Intravenous 30 min pre-op 12/31/20 0749 12/31/20 1003   12/15/20 0800  cefTRIAXone (ROCEPHIN) 1 g in sodium chloride 0.9 % 100 mL IVPB        1 g 200 mL/hr over 30 Minutes  Intravenous Every 24 hours 12/15/20 0713 12/17/20 1034       Subjective: Disappointed she's staying until next week  Objective: Vitals:   01/13/21 1520 01/13/21 1926 01/14/21 0313 01/14/21 0747  BP: (!) 153/53 (!) 123/41 (!) 135/53 (!) 135/49  Pulse: 72 63 63 68  Resp: 15 16 16 18   Temp: 98.8 F (37.1 C) 98.2 F (36.8 C) (!) 97.5 F (36.4 C) 97.7 F (36.5 C)  TempSrc: Oral Oral Oral Oral  SpO2: 97% 100% 99% 98%  Weight:      Height:        Intake/Output Summary (Last 24 hours) at 01/14/2021 1230 Last data filed at 01/14/2021 0900 Gross per 24 hour  Intake 120 ml  Output --  Net 120 ml   Filed Weights   01/11/21 0533 01/12/21 0650 01/13/21 0500  Weight: 91.7 kg 92.8 kg 94.7 kg    Examination:  General: No acute distress. Cardiovascular: Heart sounds show Susan House regular rate, and rhythm.  Lungs: Clear to auscultation bilaterally Abdomen: Soft, nontender, nondistended Neurological: Alert and oriented 3. Moves all extremities 4. Cranial nerves II through XII grossly intact. Skin: Warm and dry. No rashes or lesions. Extremities: No clubbing or cyanosis.  No edema.   Data Reviewed: I have personally reviewed following labs and imaging studies  CBC: Recent Labs  Lab 01/10/21 0523 01/11/21 0540 01/12/21 0551 01/13/21 0411 01/14/21 0507  WBC 5.4 5.8 6.9 6.0 7.4  NEUTROABS 2.5 2.9 3.9 3.3 4.2  HGB 7.5* 8.0* 8.4* 7.9* 8.4*  HCT 22.9* 23.8* 25.4* 23.8* 25.9*  MCV 90.5 88.8 90.1 91.2 91.5  PLT 157 180 200 197 831    Basic Metabolic Panel: Recent Labs  Lab 01/10/21 0523 01/11/21 0540 01/12/21 0551 01/13/21 0411 01/14/21 0507  NA 133* 136 137 136 136  K 3.8 3.7 3.8 4.0 3.8  CL 98 99 99 101 98  CO2 29 28 28 28 30   GLUCOSE 159* 98 98 261* 100*  BUN 48* 57* 34* 45* 29*  CREATININE 3.28* 3.49* 2.73* 3.43* 2.21*  CALCIUM 8.0* 8.2* 8.5* 8.1* 8.5*  MG  --   --   --  2.3 2.1  PHOS 5.6* 6.1* 4.4 5.4* 4.2    GFR: Estimated Creatinine Clearance: 27.6 mL/min (Susan House)  (by C-G formula based on SCr of 2.21 mg/dL (H)).  Liver Function Tests: Recent Labs  Lab 01/10/21 0523 01/11/21 0540 01/12/21 0551 01/13/21 0411 01/14/21 0507  AST  --   --   --  24 25  ALT  --   --   --  15 14  ALKPHOS  --   --   --  63 61  BILITOT  --   --   --  0.6 0.6  PROT  --   --   --  5.4* 5.8*  ALBUMIN 2.4* 2.7* 2.8* 2.7* 2.9*    CBG: Recent Labs  Lab 01/13/21 1323 01/13/21 1721 01/13/21 2035 01/14/21 0746 01/14/21 1146  GLUCAP 159* 269* 206* 111* 207*     Recent Results (from the past 240 hour(s))  Urine Culture     Status: Abnormal   Collection Time: 01/06/21  4:22 AM   Specimen: Urine, Random  Result Value Ref Range Status   Specimen Description   Final    URINE, RANDOM Performed at [facility], [address], [place], [place] [address]    Special Requests   Final    NONE Performed at [facility], [address] [place], [place] [address]    Culture (Susan House)  Final    70,000 COLONIES/mL MULTIPLE SPECIES PRESENT, SUGGEST RECOLLECTION   Report Status 01/07/2021 FINAL  Final  CULTURE, BLOOD (ROUTINE X 2) w Reflex to ID Panel     Status: None   Collection Time: 01/06/21  8:19 AM   Specimen: BLOOD  Result Value Ref Range Status   Specimen Description BLOOD RIGHT [facility]  Final   Special Requests   Final    BOTTLES DRAWN AEROBIC AND ANAEROBIC Blood Culture adequate volume   Culture   Final    NO GROWTH 5 DAYS Performed at [facility], [address] [place], [place] [address]    Report Status 01/11/2021 FINAL  Final  CULTURE, BLOOD (ROUTINE X 2) w Reflex to ID Panel     Status: None   Collection Time: 01/06/21  8:21 AM   Specimen: BLOOD  Result Value Ref Range Status   Specimen Description BLOOD RIGHT HAND  Final   Special Requests   Final    BOTTLES DRAWN AEROBIC AND ANAEROBIC Blood Culture adequate volume   Culture   Final    NO GROWTH 5 DAYS Performed at [facility] [address], [place],  [place]  [address]    Report Status 01/11/2021 FINAL  Final  Resp Panel by RT-PCR (Flu Rylei Codispoti&B, Covid) Nasopharyngeal Swab     Status: None   Collection Time: 01/13/21  1:32 PM   Specimen: Nasopharyngeal Swab; Nasopharyngeal(NP) swabs in vial transport medium  Result Value Ref Range Status   SARS Coronavirus 2 by RT PCR NEGATIVE NEGATIVE Final    Comment: (NOTE) SARS-CoV-2 target nucleic acids are NOT DETECTED.  The SARS-CoV-2 RNA is generally detectable in upper respiratory specimens during the acute phase of infection. The lowest concentration of SARS-CoV-2 viral copies this assay can detect is 138 copies/mL. Susan House negative result does not preclude SARS-Cov-2 infection and should not be used as the sole basis for treatment or other patient management decisions. Susan House negative result may occur with  improper specimen collection/handling, submission of specimen other than nasopharyngeal swab, presence of viral mutation(s) within the areas targeted by this assay, and inadequate number of viral copies(<138 copies/mL). Susan House negative result must be combined with clinical observations, patient history, and epidemiological information. The expected result is Negative.  Fact Sheet for Patients:  EntrepreneurPulse.com.au  Fact Sheet for Healthcare Providers:  IncredibleEmployment.be  This test is no t yet approved or cleared by the [place] FDA and  has been authorized for detection and/or diagnosis of SARS-CoV-2 by FDA under an Emergency Use Authorization (EUA). This EUA will remain  in effect (meaning this test can be used) for the duration of the COVID-19 declaration under Section 564(b)(1) of the Act, 21 U.S.C.section 360bbb-3(b)(1), unless the authorization is terminated  or revoked sooner.       Influenza Amin [place] by PCR NEGATIVE NEGATIVE Final   Influenza B by PCR NEGATIVE NEGATIVE Final    Comment: (NOTE) The Xpert Xpress SARS-CoV-2/FLU/RSV plus assay is intended as an  aid in the diagnosis of influenza from Nasopharyngeal swab specimens and should not be used as Susan House sole basis for treatment. Nasal washings and aspirates are unacceptable for Xpert Xpress SARS-CoV-2/FLU/RSV testing.  Fact Sheet for Patients: EntrepreneurPulse.com.au  Fact Sheet for Healthcare Providers: IncredibleEmployment.be  This test is not yet approved or cleared by the [place] FDA and has been authorized for detection and/or diagnosis of SARS-CoV-2 by FDA under an Emergency Use Authorization (EUA). This EUA will remain in effect (meaning this test can be used) for the duration of the COVID-19 declaration under Section 564(b)(1) of the Act, 21 U.S.C. section 360bbb-3(b)(1), unless the authorization is terminated or revoked.  Performed at [facility], [address], [place], [place] [address]          Radiology Studies: No results found.      Scheduled Meds:  amLODipine  10 mg Oral Daily   aspirin  81 mg Oral Daily   atorvastatin  20 mg Oral QHS   Chlorhexidine Gluconate Cloth  6 each Topical Q0600   cloNIDine  0.3 mg Oral BID   clopidogrel  75 mg Oral Daily   epoetin (EPOGEN/PROCRIT) injection  4,000 Units Intravenous Q T,Th,Sa-HD   feeding supplement (NEPRO CARB STEADY)  237 mL Oral BID BM   gabapentin  100 mg Oral QHS   heparin  5,000 Units Subcutaneous Q8H   hydrALAZINE  100 mg Oral Q6H   insulin aspart  0-15 Units Subcutaneous TID AC & HS   insulin aspart  5 Units Subcutaneous TID WC   insulin glargine  10 Units Subcutaneous QHS   isosorbide mononitrate  60 mg Oral Daily   levETIRAcetam  500 mg  Oral Q24H   metoprolol tartrate  50 mg Oral BID   multivitamin  1 tablet Oral QHS   nystatin  1 application Topical BID   pantoprazole  40 mg Oral Daily   polyethylene glycol  17 g Oral Daily   senna-docusate  2 tablet Oral BID   torsemide  20 mg Oral Daily   vitamin B-12  250 mcg Oral Daily   Continuous  Infusions:  sodium chloride     sodium chloride Stopped (01/06/21 1455)     LOS: 29 days    Time spent: over 30 min    Fayrene Helper, MD [facility]   To contact the attending provider between 7A-7P or the covering provider during after hours 7P-7A, please log into the web site [email or web address] and access using universal [place] password for that web site. If you do not have the password, please call the hospital operator.  01/14/2021, 12:30 PM

## 2021-01-15 LAB — GLUCOSE, CAPILLARY
Glucose-Capillary: 113 mg/dL — ABNORMAL HIGH (ref 70–99)
Glucose-Capillary: 131 mg/dL — ABNORMAL HIGH (ref 70–99)
Glucose-Capillary: 262 mg/dL — ABNORMAL HIGH (ref 70–99)
Glucose-Capillary: 131 mg/dL — ABNORMAL HIGH (ref 70–99)

## 2021-01-15 LAB — PHOSPHORUS: Phosphorus: 4.6 mg/dL (ref 2.5–4.6)

## 2021-01-15 LAB — COMPREHENSIVE METABOLIC PANEL
ALT: 13 U/L (ref 0–44)
AST: 19 U/L (ref 15–41)
Albumin: 2.7 g/dL — ABNORMAL LOW (ref 3.5–5.0)
Alkaline Phosphatase: 58 U/L (ref 38–126)
Anion gap: 9 (ref 5–15)
BUN: 45 mg/dL — ABNORMAL HIGH (ref 8–23)
CO2: 28 mmol/L (ref 22–32)
Calcium: 8.6 mg/dL — ABNORMAL LOW (ref 8.9–10.3)
Chloride: 99 mmol/L (ref 98–111)
Creatinine, Ser: 3.13 mg/dL — ABNORMAL HIGH (ref 0.44–1.00)
GFR, Estimated: 16 mL/min — ABNORMAL LOW (ref >60)
Glucose, Bld: 117 mg/dL — ABNORMAL HIGH (ref 70–99)
Potassium: 3.6 mmol/L (ref 3.5–5.1)
Sodium: 136 mmol/L (ref 135–145)
Total Bilirubin: 0.5 mg/dL (ref 0.3–1.2)
Total Protein: 5.5 g/dL — ABNORMAL LOW (ref 6.5–8.1)

## 2021-01-15 LAB — CBC WITH DIFFERENTIAL/PLATELET
Abs Immature Granulocytes: 0.05 10*3/uL (ref 0.00–0.07)
Basophils Absolute: 0 10*3/uL (ref 0.0–0.1)
Basophils Relative: 1 %
Eosinophils Absolute: 0.4 10*3/uL (ref 0.0–0.5)
Eosinophils Relative: 6 %
HCT: 25.1 % — ABNORMAL LOW (ref 36.0–46.0)
Hemoglobin: 8.1 g/dL — ABNORMAL LOW (ref 12.0–15.0)
Immature Granulocytes: 1 %
Lymphocytes Relative: 22 %
Lymphs Abs: 1.4 10*3/uL (ref 0.7–4.0)
MCH: 30.2 pg (ref 26.0–34.0)
MCHC: 32.3 g/dL (ref 30.0–36.0)
MCV: 93.7 fL (ref 80.0–100.0)
Monocytes Absolute: 0.9 10*3/uL (ref 0.1–1.0)
Monocytes Relative: 14 %
Neutro Abs: 3.6 10*3/uL (ref 1.7–7.7)
Neutrophils Relative %: 56 %
Platelets: 216 10*3/uL (ref 150–400)
RBC: 2.68 MIL/uL — ABNORMAL LOW (ref 3.87–5.11)
RDW: 14.9 % (ref 11.5–15.5)
WBC: 6.4 10*3/uL (ref 4.0–10.5)
nRBC: 0 % (ref 0.0–0.2)

## 2021-01-15 LAB — MAGNESIUM: Magnesium: 2.1 mg/dL (ref 1.7–2.4)

## 2021-01-15 MED ORDER — LEVETIRACETAM 250 MG PO TABS
250.0000 mg | ORAL_TABLET | Freq: Once | ORAL | Status: AC
  Administered 2021-01-15: 250 mg via ORAL
  Filled 2021-01-15: qty 1

## 2021-01-15 NOTE — Progress Notes (Signed)
PROGRESS NOTE    Susan House  ZYS:[id number] DOB: 1955-10-11 DOA: 12/14/2020 PCP: Jerrol Banana., MD   Chief Complaint  Patient presents with   Weakness    Brief Narrative:  65 y.o. female with medical history significant for history of CVA with residual left-sided weakness, left frontal encephalomalacia, CKD stage IIIb-IV, insulin-dependent type 2 diabetes, hypertension, hyperlipidemia, seizure disorder, and memory loss who presents to the ED for evaluation of generalized weakness and abnormal labs, found to have AKI on CKD and evidence of urinary tract infection.  Patient admitted for further management.  Due to worsening kidney function, nephrology consulted, HD was started on 12/31/2020.  Patient still making urine and BP still remains labile.  Stable for discharge at this time, unfortunately, can't be new start dialysis over weekend.  Facility unable to take her until Wednesday (July 6).   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  CT abdomen pelvis did not show this mass Patient has outpatient HD set up already for T/TH/S - unable to d/c today unfortunately, can't be new start on weekend 6/29 she mentioned that she may not want long term dialysis.  Discussed with Dr. Candiss Norse who noted  expectation that she'll probably need long term dialysis.  I encouraged [name] to take her time and not rush regarding decision given importance.  She was appreciative of this.  Encouraged continued discussion of this with family and renal going forward.   Acute metabolic encephalopathy Improved Likely 2/2 ?UTI Vs ??  Postictal state, ??  Seizure Recently completed 3 days of Rocephin for UTI Procalcitonin 1.02-->1.44 UA with large leukocytes, many bacteria, > 50 WBC UC with multiple species BC X 2 NGTD CXR with atelectasis, otherwise unremarkable Completed 5 days of IV Rocephin Code stroke was activated, CT head negative for any acute intracranial abnormality Neurology consulted, appreciate recs, no further recs - suspected encephalopathy due to infection   Hypokalemia Replace as needed   Refractory/labile hypertension  Continue current regimen of amlodipine, clonidine, hydralazine, metoprolol, imdur, torsemide   Anemia of chronic kidney disease and B12 deficiency Hemoglobin stable s/p 1 PRBC Continue B12 supplementation   Insulin-dependent type 2 diabetes, uncontrolled A1c 8.6% SSI, Lantus, NovoLog 3 times daily, Accu-Cheks, hypoglycemic protocol   History of CVA with residual left-sided weakness/left frontal encephalomalacia Continue aspirin, Plavix, atorvastatin.   Seizure disorder Continue Keppra Seizure precaution   Memory loss Suspected underlying vascular dementia.   She had some reported confusion/delirium prior to arrival.   Baseline level of mentation is unclear CT head significant for chronic volume loss and small vessel ischemic changes consistent with suspected vascular dementia Outpatient follow-up   Constipation Continue Senokot-S and MiraLAX  DVT prophylaxis: heparin Code Status: DNR Family Communication: husband at bedside Disposition:   Status is: Inpatient  Remains inpatient appropriate because:Inpatient level of  care appropriate due to  severity of illness  Dispo: The patient is from: Home              Anticipated d/c is to: SNF              Patient currently is not medically stable to d/c.   Difficult to place patient No  Consultants:  Renal  Neurology Vascular   Procedures:  6/17 PROCEDURE: Ultrasound guidance for vascular access to the right internal jugular vein Fluoroscopic guidance for placement of catheter Placement of Bradyn Soward 19 cm tip to cuff tunneled hemodialysis catheter via the right internal jugular vein  6/8 Renal artery US IMPRESSION: 1. Renal artery velocities within normal limits. 2. Nonvisualization of the abdominal aorta, therefore renal artery-aorta ratios could not be measured. 3. Partially visualized cystic mass noted in the right abdomen measuring 10.8 x 5.6 cm is of uncertain etiology. Further evaluation with CT of the abdomen and pelvis should be performed to better evaluate this structure.   These results will be called to the ordering clinician or representative by the Radiologist Assistant, and communication documented in the PACS or [employer].  Antimicrobials:  Anti-infectives (From admission, onward)    Start     Dose/Rate Route Frequency Ordered Stop   01/06/21 0845  cefTRIAXone (ROCEPHIN) 1 g in sodium chloride 0.9 % 100 mL IVPB  Status:  Discontinued        1 g 200 mL/hr over 30 Minutes Intravenous Every 24 hours 01/06/21 0751 01/10/21 1557   12/31/20 0900  ceFAZolin (ANCEF) powder 1 g  Status:  Discontinued        1 g Other To Surgery 12/31/20 0801 12/31/20 0808   12/31/20 0752  sodium chloride 0.9 % with ceFAZolin (ANCEF) ADS Med       Note to Pharmacy: Corlis Hove   : cabinet override      12/31/20 0752 12/31/20 1959   12/31/20 0749  ceFAZolin (ANCEF) IVPB 2g/100 mL premix  Status:  Discontinued        2 g 200 mL/hr over 30 Minutes Intravenous 30 min pre-op 12/31/20 0749 12/31/20 1003   12/15/20 0800  cefTRIAXone (ROCEPHIN) 1 g in sodium chloride 0.9 % 100 mL  IVPB        1 g 200 mL/hr over 30 Minutes Intravenous Every 24 hours 12/15/20 0713 12/17/20 1034       Subjective: No new concerns  Objective: Vitals:   01/15/21 1215 01/15/21 1230 01/15/21 1245 01/15/21 1656  BP: (!) 171/58 (!) 175/59 (!) 168/61 (!) 175/67  Pulse:    74  Resp: 14 (!) 23 18 16   Temp:    98.3 F (36.8 C)  TempSrc:    Oral  SpO2:    94%  Weight:      Height:        Intake/Output Summary (Last 24 hours) at 01/15/2021 1811 Last data filed at 01/15/2021 1245 Gross per 24 hour  Intake --  Output 1600 ml  Net -1600 ml   Filed Weights   01/11/21 0533 01/12/21 0650 01/13/21 0500  Weight: 91.7 kg 92.8 kg 94.7 kg    Examination:  General: No acute distress. Cardiovascular: RRR Lungs: unlabored Abdomen: Soft, nontender, nondistended  Neurological: Alert and oriented 3. Moves all extremities 4 . Cranial nerves II through XII grossly intact. Skin: Warm and dry. No rashes or lesions. Extremities: No clubbing or cyanosis. No edema.     Data Reviewed: I have personally reviewed following labs and  imaging studies  CBC: Recent Labs  Lab 01/11/21 0540 01/12/21 0551 01/13/21 0411 01/14/21 0507 01/15/21 0355  WBC 5.8 6.9 6.0 7.4 6.4  NEUTROABS 2.9 3.9 3.3 4.2 3.6  HGB 8.0* 8.4* 7.9* 8.4* 8.1*  HCT 23.8* 25.4* 23.8* 25.9* 25.1*  MCV 88.8 90.1 91.2 91.5 93.7  PLT 180 200 197 221 517    Basic Metabolic Panel: Recent Labs  Lab 01/11/21 0540 01/12/21 0551 01/13/21 0411 01/14/21 0507 01/15/21 0355  NA 136 137 136 136 136  K 3.7 3.8 4.0 3.8 3.6  CL 99 99 101 98 99  CO2 28 28 28 30 28   GLUCOSE 98 98 261* 100* 117*  BUN 57* 34* 45* 29* 45*  CREATININE 3.49* 2.73* 3.43* 2.21* 3.13*  CALCIUM 8.2* 8.5* 8.1* 8.5* 8.6*  MG  --   --  2.3 2.1 2.1  PHOS 6.1* 4.4 5.4* 4.2 4.6    GFR: Estimated Creatinine Clearance: 19.5 mL/min (Ronesha Heenan) (by C-G formula based on SCr of 3.13 mg/dL (H)).  Liver Function Tests: Recent Labs  Lab 01/11/21 0540 01/12/21 0551  01/13/21 0411 01/14/21 0507 01/15/21 0355  AST  --   --  24 25 19   ALT  --   --  15 14 13   ALKPHOS  --   --  63 61 58  BILITOT  --   --  0.6 0.6 0.5  PROT  --   --  5.4* 5.8* 5.5*  ALBUMIN 2.7* 2.8* 2.7* 2.9* 2.7*    CBG: Recent Labs  Lab 01/14/21 1538 01/14/21 2238 01/15/21 0746 01/15/21 1227 01/15/21 1654  GLUCAP 181* 176* 113* 131* 262*     Recent Results (from the past 240 hour(s))  Urine Culture     Status: Abnormal   Collection Time: 01/06/21  4:22 AM   Specimen: Urine, Random  Result Value Ref Range Status   Specimen Description   Final    URINE, RANDOM Performed at [facility], [address] [place] [address], [place], [place] [address]    Special Requests   Final    NONE Performed at [facility], [address] [place], [place] [address]    Culture (Kirsten Mckone)  Final    70,000 COLONIES/mL MULTIPLE SPECIES PRESENT, SUGGEST RECOLLECTION   Report Status 01/07/2021 FINAL  Final  CULTURE, BLOOD (ROUTINE X 2) w Reflex to ID Panel     Status: None   Collection Time: 01/06/21  8:19 AM   Specimen: BLOOD  Result Value Ref Range Status   Specimen Description BLOOD RIGHT [facility]  Final   Special Requests   Final    BOTTLES DRAWN AEROBIC AND ANAEROBIC Blood Culture adequate volume   Culture   Final    NO GROWTH 5 DAYS Performed at [facility], [address] [place], [place] [address]    Report Status 01/11/2021 FINAL  Final  CULTURE, BLOOD (ROUTINE X 2) w Reflex to ID Panel     Status: None   Collection Time: 01/06/21  8:21 AM   Specimen: BLOOD  Result Value Ref Range Status   Specimen Description BLOOD RIGHT HAND  Final   Special Requests   Final    BOTTLES DRAWN AEROBIC AND ANAEROBIC Blood Culture adequate volume   Culture   Final    NO GROWTH 5 DAYS Performed at [facility], [address] [place], [place] [address]    Report Status 01/11/2021 FINAL  Final  Resp Panel by RT-PCR (Flu Vrinda Heckstall&B, Covid) Nasopharyngeal Swab  Status: None    Collection Time: 01/13/21  1:32 PM   Specimen: Nasopharyngeal Swab; Nasopharyngeal(NP) swabs in vial transport medium  Result Value Ref Range Status   SARS Coronavirus 2 by RT PCR NEGATIVE NEGATIVE Final    Comment: (NOTE) SARS-CoV-2 target nucleic acids are NOT DETECTED.  The SARS-CoV-2 RNA is generally detectable in upper respiratory specimens during the acute phase of infection. The lowest concentration of SARS-CoV-2 viral copies this assay can detect is 138 copies/mL. Izzabelle Bouley negative result does not preclude SARS-Cov-2 infection and should not be used as the sole basis for treatment or other patient management decisions. Taralee Marcus negative result may occur with  improper specimen collection/handling, submission of specimen other than nasopharyngeal swab, presence of viral mutation(s) within the areas targeted by this assay, and inadequate number of viral copies(<138 copies/mL). Ziaire Bieser negative result must be combined with clinical observations, patient history, and epidemiological information. The expected result is Negative.  Fact Sheet for Patients:  EntrepreneurPulse.com.au  Fact Sheet for Healthcare Providers:  IncredibleEmployment.be  This test is no t yet approved or cleared by the [place] FDA and  has been authorized for detection and/or diagnosis of SARS-CoV-2 by FDA under an Emergency Use Authorization (EUA). This EUA will remain  in effect (meaning this test can be used) for the duration of the COVID-19 declaration under Section 564(b)(1) of the Act, 21 U.S.C.section 360bbb-3(b)(1), unless the authorization is terminated  or revoked sooner.       Influenza Sakari Raisanen by PCR NEGATIVE NEGATIVE Final   Influenza B by PCR NEGATIVE NEGATIVE Final    Comment: (NOTE) The Xpert Xpress SARS-CoV-2/FLU/RSV plus assay is intended as an aid in the diagnosis of influenza from Nasopharyngeal swab specimens and should not be used as Ovida Delagarza sole basis for treatment.  Nasal washings and aspirates are unacceptable for Xpert Xpress SARS-CoV-2/FLU/RSV testing.  Fact Sheet for Patients: EntrepreneurPulse.com.au  Fact Sheet for Healthcare Providers: IncredibleEmployment.be  This test is not yet approved or cleared by the [place] FDA and has been authorized for detection and/or diagnosis of SARS-CoV-2 by FDA under an Emergency Use Authorization (EUA). This EUA will remain in effect (meaning this test can be used) for the duration of the COVID-19 declaration under Section 564(b)(1) of the Act, 21 U.S.C. section 360bbb-3(b)(1), unless the authorization is terminated or revoked.  Performed at [facility], [address] [place], [place] [address]          Radiology Studies: No results found.      Scheduled Meds:  amLODipine  10 mg Oral Daily   aspirin  81 mg Oral Daily   atorvastatin  20 mg Oral QHS   Chlorhexidine Gluconate Cloth  6 each Topical Q0600   cloNIDine  0.3 mg Oral BID   clopidogrel  75 mg Oral Daily   epoetin (EPOGEN/PROCRIT) injection  4,000 Units Intravenous Q T,Th,Sa-HD   feeding supplement (NEPRO CARB STEADY)  237 mL Oral BID BM   gabapentin  100 mg Oral QHS   heparin  5,000 Units Subcutaneous Q8H   hydrALAZINE  100 mg Oral Q6H   insulin aspart  0-15 Units Subcutaneous TID AC & HS   insulin aspart  5 Units Subcutaneous TID WC   insulin glargine  10 Units Subcutaneous QHS   isosorbide mononitrate  60 mg Oral Daily   levETIRAcetam  500 mg Oral Q24H   metoprolol tartrate  50 mg Oral BID   multivitamin  1 tablet Oral QHS   nystatin  1 application  Topical BID   pantoprazole  40 mg Oral Daily   polyethylene glycol  17 g Oral Daily   senna-docusate  2 tablet Oral BID   torsemide  20 mg Oral Daily   vitamin B-12  250 mcg Oral Daily   Continuous Infusions:  sodium chloride     sodium chloride Stopped (01/06/21 1455)     LOS: 30 days    Time spent: over 30  min    Fayrene Helper, MD [facility]   To contact the attending provider between 7A-7P or the covering provider during after hours 7P-7A, please log into the web site [email or web address] and access using universal [place] password for that web site. If you do not have the password, please call the hospital operator.  01/15/2021, 6:11 PM

## 2021-01-15 NOTE — Progress Notes (Signed)
OT Cancellation Note  Patient Details Name: Susan House MRN: [id number] DOB: [birth year]   Cancelled Treatment:    Reason Eval/Treat Not Completed: Patient at procedure or test/ unavailable. Pt off the floor for dialysis. Will re-attempt OT tx at later date/time as pt is available and medically appropriate.   Hanley Hays, MPH, MS, OTR/L ascom [phone] 01/15/21, 12:25 PM

## 2021-01-15 NOTE — Progress Notes (Signed)
Pt is alert , tolerated tx well with no complaints and completed her goal of 1500

## 2021-01-15 NOTE — Progress Notes (Signed)
Spoke with Markus Daft in telemetry at [phone] to transfer patient from room 203 to [facility] 1.

## 2021-01-15 NOTE — Progress Notes (Signed)
[facility]  ROUNDING NOTE   Subjective:   Patient resting in bed Completed breakfast Denies GI upset and shortness of breath  Patient seen later during dialysis   HEMODIALYSIS FLOWSHEET:  Blood Flow Rate (mL/min): 300 mL/min Arterial Pressure (mmHg): -100 mmHg Venous Pressure (mmHg): 70 mmHg Transmembrane Pressure (mmHg): 70 mmHg Ultrafiltration Rate (mL/min): 500 mL/min Dialysate Flow Rate (mL/min): 500 ml/min Conductivity: Machine : 13.7 Conductivity: Machine : 13.7 Dialysis Fluid Bolus: Normal Saline Bolus Amount (mL): 250 mL   Objective:  Vital signs in last 24 hours:  Temp:  [97.5 F (36.4 C)-98.8 F (37.1 C)] 97.6 F (36.4 C) (07/02 0858) Pulse Rate:  [57-77] 65 (07/02 0858) Resp:  [16-18] 18 (07/02 0858) BP: (106-143)/(41-68) 133/68 (07/02 0858) SpO2:  [97 %-100 %] 100 % (07/02 0858)  Weight change:  Filed Weights   01/11/21 0533 01/12/21 0650 01/13/21 0500  Weight: 91.7 kg 92.8 kg 94.7 kg    Intake/Output: I/O last 3 completed shifts: In: 600 [P.O.:600] Out: 600 [Urine:600]   Intake/Output this shift:  No intake/output data recorded.  Physical Exam: General: NAD.  Sitting in chair  Head: Normocephalic, atraumatic.moist oral mucosal membranes  Eyes: Anicteric  Lungs:  Clear , normal effort  Heart: regular  Abdomen:  Soft, nontender, non distended  Extremities:  No  peripheral edema.  Neurologic:  Alert and oriented  Skin: No acute lesions or rashes  Access: RIJ permcath    Basic Metabolic Panel: Recent Labs  Lab 01/11/21 0540 01/12/21 0551 01/13/21 0411 01/14/21 0507 01/15/21 0355  NA 136 137 136 136 136  K 3.7 3.8 4.0 3.8 3.6  CL 99 99 101 98 99  CO2 28 28 28 30 28   GLUCOSE 98 98 261* 100* 117*  BUN 57* 34* 45* 29* 45*  CREATININE 3.49* 2.73* 3.43* 2.21* 3.13*  CALCIUM 8.2* 8.5* 8.1* 8.5* 8.6*  MG  --   --  2.3 2.1 2.1  PHOS 6.1* 4.4 5.4* 4.2 4.6     Liver Function Tests: Recent Labs  Lab 01/11/21 0540  01/12/21 0551 01/13/21 0411 01/14/21 0507 01/15/21 0355  AST  --   --  24 25 19   ALT  --   --  15 14 13   ALKPHOS  --   --  63 61 58  BILITOT  --   --  0.6 0.6 0.5  PROT  --   --  5.4* 5.8* 5.5*  ALBUMIN 2.7* 2.8* 2.7* 2.9* 2.7*    No results for input(s): LIPASE, AMYLASE in the last 168 hours. No results for input(s): AMMONIA in the last 168 hours.  CBC: Recent Labs  Lab 01/11/21 0540 01/12/21 0551 01/13/21 0411 01/14/21 0507 01/15/21 0355  WBC 5.8 6.9 6.0 7.4 6.4  NEUTROABS 2.9 3.9 3.3 4.2 3.6  HGB 8.0* 8.4* 7.9* 8.4* 8.1*  HCT 23.8* 25.4* 23.8* 25.9* 25.1*  MCV 88.8 90.1 91.2 91.5 93.7  PLT 180 200 197 221 216     Cardiac Enzymes: No results for input(s): CKTOTAL, CKMB, CKMBINDEX, TROPONINI in the last 168 hours.  BNP: Invalid input(s): POCBNP  CBG: Recent Labs  Lab 01/14/21 0746 01/14/21 1146 01/14/21 1538 01/14/21 2238 01/15/21 0746  GLUCAP 111* 207* 181* 176* 113*     Microbiology: Results for orders placed or performed during the hospital encounter of 12/14/20  SARS CORONAVIRUS 2 (TAT 6-24 HRS) Nasopharyngeal Nasopharyngeal Swab     Status: None   Collection Time: 12/14/20  8:28 PM   Specimen: Nasopharyngeal Swab  Result  Value Ref Range Status   SARS Coronavirus 2 NEGATIVE NEGATIVE Final    Comment: (NOTE) SARS-CoV-2 target nucleic acids are NOT DETECTED.  The SARS-CoV-2 RNA is generally detectable in upper and lower respiratory specimens during the acute phase of infection. Negative results do not preclude SARS-CoV-2 infection, do not rule out co-infections with other pathogens, and should not be used as the sole basis for treatment or other patient management decisions. Negative results must be combined with clinical observations, patient history, and epidemiological information. The expected result is Negative.  Fact Sheet for Patients: SugarRoll.be  Fact Sheet for Healthcare  Providers: https://www.woods-mathews.com/  This test is not yet approved or cleared by the [place] FDA and  has been authorized for detection and/or diagnosis of SARS-CoV-2 by FDA under an Emergency Use Authorization (EUA). This EUA will remain  in effect (meaning this test can be used) for the duration of the COVID-19 declaration under Se ction 564(b)(1) of the Act, 21 U.S.C. section 360bbb-3(b)(1), unless the authorization is terminated or revoked sooner.  Performed at [facility], [place] [address] [place], [place] [address]   SARS CORONAVIRUS 2 (TAT 6-24 HRS) Nasopharyngeal Nasopharyngeal Swab     Status: None   Collection Time: 12/19/20  4:40 PM   Specimen: Nasopharyngeal Swab  Result Value Ref Range Status   SARS Coronavirus 2 NEGATIVE NEGATIVE Final    Comment: (NOTE) SARS-CoV-2 target nucleic acids are NOT DETECTED.  The SARS-CoV-2 RNA is generally detectable in upper and lower respiratory specimens during the acute phase of infection. Negative results do not preclude SARS-CoV-2 infection, do not rule out co-infections with other pathogens, and should not be used as the sole basis for treatment or other patient management decisions. Negative results must be combined with clinical observations, patient history, and epidemiological information. The expected result is Negative.  Fact Sheet for Patients: SugarRoll.be  Fact Sheet for Healthcare Providers: https://www.woods-mathews.com/  This test is not yet approved or cleared by the [place] FDA and  has been authorized for detection and/or diagnosis of SARS-CoV-2 by FDA under an Emergency Use Authorization (EUA). This EUA will remain  in effect (meaning this test can be used) for the duration of the COVID-19 declaration under Se ction 564(b)(1) of the Act, 21 U.S.C. section 360bbb-3(b)(1), unless the authorization is terminated or revoked  sooner.  Performed at [facility], [place] [address] [place], [place] [address]   Urine Culture     Status: Abnormal   Collection Time: 01/06/21  4:22 AM   Specimen: Urine, Random  Result Value Ref Range Status   Specimen Description   Final    URINE, RANDOM Performed at [facility], [address] [place], [place] [address]    Special Requests   Final    NONE Performed at [facility], [address] [place], [place] [address]    Culture (A)  Final    70,000 COLONIES/mL MULTIPLE SPECIES PRESENT, SUGGEST RECOLLECTION   Report Status 01/07/2021 FINAL  Final  CULTURE, BLOOD (ROUTINE X 2) w Reflex to ID Panel     Status: None   Collection Time: 01/06/21  8:19 AM   Specimen: BLOOD  Result Value Ref Range Status   Specimen Description BLOOD RIGHT [facility]  Final   Special Requests   Final    BOTTLES DRAWN AEROBIC AND ANAEROBIC Blood Culture adequate volume   Culture   Final    NO GROWTH 5 DAYS Performed at [facility], [address] [place], [place] [address]    Report Status 01/11/2021 FINAL  Final  CULTURE, BLOOD (ROUTINE X 2) w Reflex to ID Panel     Status: None   Collection Time: 01/06/21  8:21 AM   Specimen: BLOOD  Result Value Ref Range Status   Specimen Description BLOOD RIGHT HAND  Final   Special Requests   Final    BOTTLES DRAWN AEROBIC AND ANAEROBIC Blood Culture adequate volume   Culture   Final    NO GROWTH 5 DAYS Performed at [facility], [address] [place], [place] [address]    Report Status 01/11/2021 FINAL  Final  Resp Panel by RT-PCR (Flu A&B, Covid) Nasopharyngeal Swab     Status: None   Collection Time: 01/13/21  1:32 PM   Specimen: Nasopharyngeal Swab; Nasopharyngeal(NP) swabs in vial transport medium  Result Value Ref Range Status   SARS Coronavirus 2 by RT PCR NEGATIVE NEGATIVE Final    Comment: (NOTE) SARS-CoV-2 target nucleic acids are NOT DETECTED.  The SARS-CoV-2 RNA is generally detectable  in upper respiratory specimens during the acute phase of infection. The lowest concentration of SARS-CoV-2 viral copies this assay can detect is 138 copies/mL. A negative result does not preclude SARS-Cov-2 infection and should not be used as the sole basis for treatment or other patient management decisions. A negative result may occur with  improper specimen collection/handling, submission of specimen other than nasopharyngeal swab, presence of viral mutation(s) within the areas targeted by this assay, and inadequate number of viral copies(<138 copies/mL). A negative result must be combined with clinical observations, patient history, and epidemiological information. The expected result is Negative.  Fact Sheet for Patients:  EntrepreneurPulse.com.au  Fact Sheet for Healthcare Providers:  IncredibleEmployment.be  This test is no t yet approved or cleared by the [place] FDA and  has been authorized for detection and/or diagnosis of SARS-CoV-2 by FDA under an Emergency Use Authorization (EUA). This EUA will remain  in effect (meaning this test can be used) for the duration of the COVID-19 declaration under Section 564(b)(1) of the Act, 21 U.S.C.section 360bbb-3(b)(1), unless the authorization is terminated  or revoked sooner.       Influenza A by PCR NEGATIVE NEGATIVE Final   Influenza B by PCR NEGATIVE NEGATIVE Final    Comment: (NOTE) The Xpert Xpress SARS-CoV-2/FLU/RSV plus assay is intended as an aid in the diagnosis of influenza from Nasopharyngeal swab specimens and should not be used as a sole basis for treatment. Nasal washings and aspirates are unacceptable for Xpert Xpress SARS-CoV-2/FLU/RSV testing.  Fact Sheet for Patients: EntrepreneurPulse.com.au  Fact Sheet for Healthcare Providers: IncredibleEmployment.be  This test is not yet approved or cleared by the [place] FDA and has  been authorized for detection and/or diagnosis of SARS-CoV-2 by FDA under an Emergency Use Authorization (EUA). This EUA will remain in effect (meaning this test can be used) for the duration of the COVID-19 declaration under Section 564(b)(1) of the Act, 21 U.S.C. section 360bbb-3(b)(1), unless the authorization is terminated or revoked.  Performed at [facility], [address] [place], [place] [address]     Coagulation Studies: No results for input(s): LABPROT, INR in the last 72 hours.  Urinalysis: No results for input(s): COLORURINE, LABSPEC, PHURINE, GLUCOSEU, HGBUR, BILIRUBINUR, KETONESUR, PROTEINUR, UROBILINOGEN, NITRITE, LEUKOCYTESUR in the last 72 hours.  Invalid input(s): APPERANCEUR     Imaging: No results found.   Medications:    sodium chloride  sodium chloride Stopped (01/06/21 1455)    amLODipine  10 mg Oral Daily   aspirin  81 mg Oral Daily   atorvastatin  20 mg Oral QHS   Chlorhexidine Gluconate Cloth  6 each Topical Q0600   cloNIDine  0.3 mg Oral BID   clopidogrel  75 mg Oral Daily   epoetin (EPOGEN/PROCRIT) injection  4,000 Units Intravenous Q T,Th,Sa-HD   feeding supplement (NEPRO CARB STEADY)  237 mL Oral BID BM   gabapentin  100 mg Oral QHS   heparin  5,000 Units Subcutaneous Q8H   hydrALAZINE  100 mg Oral Q6H   insulin aspart  0-15 Units Subcutaneous TID AC & HS   insulin aspart  5 Units Subcutaneous TID WC   insulin glargine  10 Units Subcutaneous QHS   isosorbide mononitrate  60 mg Oral Daily   levETIRAcetam  500 mg Oral Q24H   metoprolol tartrate  50 mg Oral BID   multivitamin  1 tablet Oral QHS   nystatin  1 application Topical BID   pantoprazole  40 mg Oral Daily   polyethylene glycol  17 g Oral Daily   senna-docusate  2 tablet Oral BID   torsemide  20 mg Oral Daily   vitamin B-12  250 mcg Oral Daily   sodium chloride, sodium chloride, acetaminophen **OR** acetaminophen, alteplase, heparin, hydrALAZINE,  HYDROcodone-acetaminophen, hydrocortisone, iohexol, lidocaine (PF), lidocaine-prilocaine, ondansetron **OR** ondansetron (ZOFRAN) IV, pentafluoroprop-tetrafluoroeth, senna-docusate  Assessment/ Plan:  [name] is a 65 y.o. white female with diabetes mellitus type II, hypertension, CVA with residual left hemiplegia, hyperlipidemia, congestive heart failure who was admitted to [facility] on 12/14/2020 for AKI (acute kidney injury) ([place]) [N17.9] Altered mental status, unspecified altered mental status type [R41.82] Acute kidney injury superimposed on chronic kidney disease ([place]) [Q73.4, L93.7]  Complicated and prolonged hospitalization. 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  Recent acute kidney injury secondary to ATN, prerenal azotemia and uncontrolled hypertension. Inconsistent recovery and results of 24 hours urine creatinine clearance indicate ESRD Initiated on dialysis on 12/31/20 - Receiving dialysis in a chair, tolerating well -UF goal 1L as tolerated -Next treatment scheduled for Tuesday - Outpatient arranged for [facility], TTS.   Lab Results  Component Value Date   CREATININE 3.13 (H) 01/15/2021   CREATININE 2.21 (H) 01/14/2021   CREATININE 3.43 (H) 01/13/2021    Intake/Output Summary (Last 24 hours) at 01/15/2021 0917 Last data filed at 01/14/2021 1935 Gross per 24 hour  Intake 480 ml  Output 600 ml  Net -120 ml     2. Hypertension: 133/68. Volume status at goal.   Current regimen of amlodipine, clonidine, hydralazine, isosorbide mononitrate, metoprolol and torsemide.  3. 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  Hgb A1c 8.6% on 11/29/20 Elevated at times Ssi managed by primary team  4. Anemia of CKD with iron deficiency.  Lab Results  Component Value Date   HGB 8.1 (L) 01/15/2021   - will monitor - EPO 4000 units with dialysis   LOS:  [place] 7/2/20229:17 AM

## 2021-01-16 LAB — CBC WITH DIFFERENTIAL/PLATELET
Abs Immature Granulocytes: 0.04 10*3/uL (ref 0.00–0.07)
Basophils Absolute: 0 10*3/uL (ref 0.0–0.1)
Basophils Relative: 1 %
Eosinophils Absolute: 0.2 10*3/uL (ref 0.0–0.5)
Eosinophils Relative: 2 %
HCT: 27.1 % — ABNORMAL LOW (ref 36.0–46.0)
Hemoglobin: 8.8 g/dL — ABNORMAL LOW (ref 12.0–15.0)
Immature Granulocytes: 1 %
Lymphocytes Relative: 19 %
Lymphs Abs: 1.3 10*3/uL (ref 0.7–4.0)
MCH: 29.7 pg (ref 26.0–34.0)
MCHC: 32.5 g/dL (ref 30.0–36.0)
MCV: 91.6 fL (ref 80.0–100.0)
Monocytes Absolute: 0.8 10*3/uL (ref 0.1–1.0)
Monocytes Relative: 13 %
Neutro Abs: 4.2 10*3/uL (ref 1.7–7.7)
Neutrophils Relative %: 64 %
Platelets: 252 10*3/uL (ref 150–400)
RBC: 2.96 MIL/uL — ABNORMAL LOW (ref 3.87–5.11)
RDW: 14.9 % (ref 11.5–15.5)
WBC: 6.5 10*3/uL (ref 4.0–10.5)
nRBC: 0 % (ref 0.0–0.2)

## 2021-01-16 LAB — GLUCOSE, CAPILLARY
Glucose-Capillary: 205 mg/dL — ABNORMAL HIGH (ref 70–99)
Glucose-Capillary: 165 mg/dL — ABNORMAL HIGH (ref 70–99)
Glucose-Capillary: 112 mg/dL — ABNORMAL HIGH (ref 70–99)
Glucose-Capillary: 125 mg/dL — ABNORMAL HIGH (ref 70–99)

## 2021-01-16 LAB — PHOSPHORUS
Phosphorus: 3 mg/dL (ref 2.5–4.6)
Phosphorus: 3.7 mg/dL (ref 2.5–4.6)

## 2021-01-16 LAB — COMPREHENSIVE METABOLIC PANEL
ALT: 13 U/L (ref 0–44)
AST: 23 U/L (ref 15–41)
Albumin: 2.8 g/dL — ABNORMAL LOW (ref 3.5–5.0)
Alkaline Phosphatase: 60 U/L (ref 38–126)
Anion gap: 6 (ref 5–15)
BUN: 24 mg/dL — ABNORMAL HIGH (ref 8–23)
CO2: 31 mmol/L (ref 22–32)
Calcium: 8.5 mg/dL — ABNORMAL LOW (ref 8.9–10.3)
Chloride: 96 mmol/L — ABNORMAL LOW (ref 98–111)
Creatinine, Ser: 2.19 mg/dL — ABNORMAL HIGH (ref 0.44–1.00)
GFR, Estimated: 25 mL/min — ABNORMAL LOW (ref >60)
Glucose, Bld: 180 mg/dL — ABNORMAL HIGH (ref 70–99)
Potassium: 3.8 mmol/L (ref 3.5–5.1)
Sodium: 133 mmol/L — ABNORMAL LOW (ref 135–145)
Total Bilirubin: 0.5 mg/dL (ref 0.3–1.2)
Total Protein: 5.8 g/dL — ABNORMAL LOW (ref 6.5–8.1)

## 2021-01-16 LAB — MAGNESIUM: Magnesium: 1.9 mg/dL (ref 1.7–2.4)

## 2021-01-16 NOTE — TOC Progression Note (Addendum)
Transition of Care [facility]) - Progression Note    Patient Details  Name: Susan House MRN: [id number] Date of Birth: [birth year]  Transition of Care [facility]) CM/SW [place], LCSW Phone Number: 01/16/2021, 9:39 AM  Clinical Narrative:   Patient's current insurance auth ends on Tuesday 7/5. [facility] and asked if dates can be changed or if current auth can be cancelled so CSW can restart auth with correct dates. Navi Representative reported TOC will need to keep current auth open, and redo the auth on Tuesday 7/5 if needed to correct the dates for a Wednesday 7/6 admission.     Expected Discharge Plan: [place] Barriers to Discharge: Continued Medical Work up  Expected Discharge Plan and Services Expected Discharge Plan: [place] Acute Care Choice: Resumption of Svcs/PTA Provider Living arrangements for the past 2 months: Single Family Home                                       Social Determinants of Health (SDOH) Interventions    Readmission Risk Interventions No flowsheet data found.

## 2021-01-16 NOTE — Progress Notes (Addendum)
Physical Therapy Treatment Patient Details Name: Susan House MRN: [id number] DOB: 30-Jul-1955 Today's Date: 01/16/2021    History of Present Illness Pt is a 65 y.o. female with medical history significant for history of CVA with residual left-sided weakness, left frontal encephalomalacia, CKD stage IIIb-IV, insulin-dependent type 2 diabetes, hypertension, hyperlipidemia, seizure disorder, and memory loss who presents to the ED for evaluation of generalized weakness and abnormal labs, potential UTI. Patient recently admitted 11/28/2020-12/03/2020 for AKI on CKD, discharged to SNF.    PT Comments    Participated in exercises as described below.  To EOB with min/mod a x 1 and is able to remain sitting x 20 minutes with min guard/supervision.  She needs significant assist with attempts to lateral scoot in sitting to left with limited movements.  Returned to supine and remained side lying at end of session.  Pt remains Hoyer lift appropriate for OOB with nursing.  Stated she has been up x last 3 dialysis sessions with good tolerance.  She did state she is deciding on if she wants to continue with dialysis or stop treatment.  Encouragement given.  Encouraged to do HEP on her own to increase strength and maintain function.   Follow Up Recommendations  SNF     Equipment Recommendations  None recommended by PT;Other (comment)    Recommendations for Other Services       Precautions / Restrictions Precautions Precautions: Fall Precaution Comments: L hemi Restrictions Weight Bearing Restrictions: No    Mobility  Bed Mobility Overal bed mobility: Needs Assistance Bed Mobility: Supine to Sit;Sit to Supine   Sidelying to sit: Min assist;Mod assist   Sit to supine: Mod assist        Transfers Overall transfer level: Needs assistance Equipment used: None Transfers: Lateral/Scoot Transfers Sit to Stand: Max assist;Total assist         General transfer comment: unable to  lateral scoot without max/depenant assist.  unasfe to attempt standing with +1 assist.  Harrel Lemon appropriate with nursing staff.  Ambulation/Gait                 Stairs             Wheelchair Mobility    Modified Rankin (Stroke Patients Only)       Balance Overall balance assessment: Needs assistance Sitting-balance support: Single extremity supported;Feet supported Sitting balance-Leahy Scale: Fair Sitting balance - Comments: able to sit unsupported x 20 minutes EOB.  Occasional min gaurd for self initiated weight shifting but no LOB noted                                    Cognition Arousal/Alertness: Awake/alert Behavior During Therapy: WFL for tasks assessed/performed Overall Cognitive Status: Within Functional Limits for tasks assessed                                        Exercises Other Exercises Other Exercises: supine ex x 10 BLE and seated LAQ x 10    General Comments        Pertinent Vitals/Pain Pain Assessment: No/denies pain    Home Living                      Prior Function            PT  Goals (current goals can now be found in the care plan section) Progress towards PT goals: Progressing toward goals    Frequency    Min 2X/week      PT Plan Current plan remains appropriate    Co-evaluation              AM-PAC PT "6 Clicks" Mobility   Outcome Measure  Help needed turning from your back to your side while in a flat bed without using bedrails?: A Lot Help needed moving from lying on your back to sitting on the side of a flat bed without using bedrails?: A Lot Help needed moving to and from a bed to a chair (including a wheelchair)?: Total Help needed standing up from a chair using your arms (e.g., wheelchair or bedside chair)?: Total Help needed to walk in hospital room?: Total Help needed climbing 3-5 steps with a railing? : Total 6 Click Score: 8    End of Session   Activity  Tolerance: Patient tolerated treatment well Patient left: in bed;with call bell/phone within reach;with bed alarm set;with family/visitor present Nurse Communication: Mobility status PT Visit Diagnosis: Other abnormalities of gait and mobility (R26.89);Muscle weakness (generalized) (M62.81)     Time: 9390-3009 PT Time Calculation (min) (ACUTE ONLY): 27 min  Charges:  $Therapeutic Exercise: 8-22 mins $Therapeutic Activity: 8-22 mins                    Chesley Noon, PTA 01/16/21, 3:57 PM , 3:55 PM

## 2021-01-16 NOTE — Progress Notes (Signed)
PROGRESS NOTE    Susan House  HBZ:[id number] DOB: 03/31/1956 DOA: 12/14/2020 PCP: Jerrol Banana., MD   Chief Complaint  Patient presents with   Weakness    Brief Narrative:  65 y.o. female with medical history significant for history of CVA with residual left-sided weakness, left frontal encephalomalacia, CKD stage IIIb-IV, insulin-dependent type 2 diabetes, hypertension, hyperlipidemia, seizure disorder, and memory loss who presents to the ED for evaluation of generalized weakness and abnormal labs, found to have AKI on CKD and evidence of urinary tract infection.  Patient admitted for further management.  Due to worsening kidney function, nephrology consulted, HD was started on 12/31/2020.  Patient still making urine and BP still remains labile.  Stable for discharge at this time, unfortunately, can't be new start dialysis over weekend.  Facility unable to take her until Wednesday (July 6).   Assessment & Plan:   Principal Problem:   Acute kidney injury superimposed on chronic kidney disease ([place]) Active Problems:   Hypertension associated with diabetes ([place])   Type 2 diabetes mellitus with complications ([facility])   Hemiparesis due to old cerebrovascular accident ([place])   Hyperlipidemia associated with type 2 diabetes mellitus ([place])   Hx of completed stroke   AKI (acute kidney injury) ([place])   Protein-calorie malnutrition, severe  Stable awaiting discharge.  Discharge Tuesday or Wednesday.  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  CT abdomen pelvis did not show this mass Patient has outpatient HD set up already for T/TH/S 6/29 she mentioned that she may not want long term dialysis.  Discussed with Dr. Candiss Norse who noted expectation that she'll probably need long term dialysis.  I  encouraged [name] to take her time and not rush regarding decision given importance.  She was appreciative of this.  Encouraged continued discussion of this with family and renal going forward.   Acute metabolic encephalopathy Improved Likely 2/2 ?UTI Vs ??  Postictal state, ??  Seizure Recently completed 3 days of Rocephin for UTI Procalcitonin 1.02-->1.44 UA with large leukocytes, many bacteria, > 50 WBC UC with multiple species BC X 2 NGTD CXR with atelectasis, otherwise unremarkable Completed 5 days of IV Rocephin Code stroke was activated, CT head negative for any acute intracranial abnormality Neurology consulted, appreciate recs, no further recs - suspected encephalopathy due to infection   Hypokalemia Replace as needed   Refractory/labile hypertension  Continue current regimen of amlodipine, clonidine, hydralazine, metoprolol, imdur, torsemide   Anemia of chronic kidney disease and B12 deficiency Hemoglobin stable s/p 1 PRBC Continue B12 supplementation   Insulin-dependent type 2 diabetes, uncontrolled A1c 8.6% SSI, Lantus, NovoLog 3 times daily, Accu-Cheks, hypoglycemic protocol   History of CVA with residual left-sided weakness/left frontal encephalomalacia Continue aspirin, Plavix, atorvastatin.   Seizure disorder Continue Keppra Seizure precaution   Memory loss Suspected underlying vascular dementia.   She had some reported confusion/delirium prior to arrival.   Baseline level of mentation is unclear CT head significant for chronic volume loss and small vessel ischemic changes consistent with suspected vascular dementia Outpatient follow-up   Constipation Continue Senokot-S and MiraLAX  DVT prophylaxis: heparin Code Status: DNR Family Communication: husband at bedside Disposition:   Status is: Inpatient  Remains inpatient appropriate because:Inpatient level of care appropriate due to severity of illness  Dispo: The  patient is from: Home               Anticipated d/c is to: SNF              Patient currently is not medically stable to d/c.   Difficult to place patient No  Consultants:  Renal  Neurology Vascular   Procedures:  6/17 PROCEDURE: Ultrasound guidance for vascular access to the right internal jugular vein Fluoroscopic guidance for placement of catheter Placement of Mazella Deen 19 cm tip to cuff tunneled hemodialysis catheter via the right internal jugular vein  6/8 Renal artery US IMPRESSION: 1. Renal artery velocities within normal limits. 2. Nonvisualization of the abdominal aorta, therefore renal artery-aorta ratios could not be measured. 3. Partially visualized cystic mass noted in the right abdomen measuring 10.8 x 5.6 cm is of uncertain etiology. Further evaluation with CT of the abdomen and pelvis should be performed to better evaluate this structure.   These results will be called to the ordering clinician or representative by the Radiologist Assistant, and communication documented in the PACS or [employer].  Antimicrobials:  Anti-infectives (From admission, onward)    Start     Dose/Rate Route Frequency Ordered Stop   01/06/21 0845  cefTRIAXone (ROCEPHIN) 1 g in sodium chloride 0.9 % 100 mL IVPB  Status:  Discontinued        1 g 200 mL/hr over 30 Minutes Intravenous Every 24 hours 01/06/21 0751 01/10/21 1557   12/31/20 0900  ceFAZolin (ANCEF) powder 1 g  Status:  Discontinued        1 g Other To Surgery 12/31/20 0801 12/31/20 0808   12/31/20 0752  sodium chloride 0.9 % with ceFAZolin (ANCEF) ADS Med       Note to Pharmacy: Corlis Hove   : cabinet override      12/31/20 0752 12/31/20 1959   12/31/20 0749  ceFAZolin (ANCEF) IVPB 2g/100 mL premix  Status:  Discontinued        2 g 200 mL/hr over 30 Minutes Intravenous 30 min pre-op 12/31/20 0749 12/31/20 1003   12/15/20 0800  cefTRIAXone (ROCEPHIN) 1 g in sodium chloride 0.9 % 100 mL IVPB        1 g 200 mL/hr over 30 Minutes Intravenous Every  24 hours 12/15/20 0713 12/17/20 1034       Subjective: No new complaints  Objective: Vitals:   01/16/21 0544 01/16/21 1627 01/16/21 1644 01/16/21 1645  BP: (!) 150/52  (!) 114/38 (!) 121/38  Pulse: 68  (!) 59   Resp: 20  16   Temp: 98.5 F (36.9 C) 98 F (36.7 C)    TempSrc: Oral Oral    SpO2: 96%  99%   Weight: 89.7 kg     Height:        Intake/Output Summary (Last 24 hours) at 01/16/2021 1704 Last data filed at 01/16/2021 1008 Gross per 24 hour  Intake 240 ml  Output 1100 ml  Net -860 ml   Filed Weights   01/12/21 0650 01/13/21 0500 01/16/21 0544  Weight: 92.8 kg 94.7 kg 89.7 kg    Examination:  General: No acute distress. Cardiovascular: RRR Lungs: unlabored Abdomen: Soft, nontender, nondistended Neurological: Alert and oriented 3. Moves all extremities 4 . Cranial nerves II through XII grossly intact. Skin: Warm and dry. No rashes or lesions. Extremities: No clubbing or cyanosis. No edema.   Data Reviewed: I have personally reviewed following labs and imaging studies  CBC: Recent Labs  Lab 01/12/21  3299 01/13/21 0411 01/14/21 0507 01/15/21 0355 01/16/21 0357  WBC 6.9 6.0 7.4 6.4 6.5  NEUTROABS 3.9 3.3 4.2 3.6 4.2  HGB 8.4* 7.9* 8.4* 8.1* 8.8*  HCT 25.4* 23.8* 25.9* 25.1* 27.1*  MCV 90.1 91.2 91.5 93.7 91.6  PLT 200 197 221 216 242    Basic Metabolic Panel: Recent Labs  Lab 01/12/21 0551 01/13/21 0411 01/14/21 0507 01/15/21 0355 01/16/21 0008 01/16/21 0357  NA 137 136 136 136  --  133*  K 3.8 4.0 3.8 3.6  --  3.8  CL 99 101 98 99  --  96*  CO2 28 28 30 28   --  31  GLUCOSE 98 261* 100* 117*  --  180*  BUN 34* 45* 29* 45*  --  24*  CREATININE 2.73* 3.43* 2.21* 3.13*  --  2.19*  CALCIUM 8.5* 8.1* 8.5* 8.6*  --  8.5*  MG  --  2.3 2.1 2.1  --  1.9  PHOS 4.4 5.4* 4.2 4.6 3.0 3.7    GFR: Estimated Creatinine Clearance: 27 mL/min ([name]) (by C-G formula based on SCr of 2.19 mg/dL (H)).  Liver Function Tests: Recent Labs  Lab 01/12/21 0551  01/13/21 0411 01/14/21 0507 01/15/21 0355 01/16/21 0357  AST  --  24 25 19 23   ALT  --  15 14 13 13   ALKPHOS  --  63 61 58 60  BILITOT  --  0.6 0.6 0.5 0.5  PROT  --  5.4* 5.8* 5.5* 5.8*  ALBUMIN 2.8* 2.7* 2.9* 2.7* 2.8*    CBG: Recent Labs  Lab 01/15/21 1654 01/15/21 2041 01/16/21 0741 01/16/21 1220 01/16/21 1626  GLUCAP 262* 131* 205* 165* 112*     Recent Results (from the past 240 hour(s))  Resp Panel by RT-PCR (Flu Deborrah Mabin&B, Covid) Nasopharyngeal Swab     Status: None   Collection Time: 01/13/21  1:32 PM   Specimen: Nasopharyngeal Swab; Nasopharyngeal(NP) swabs in vial transport medium  Result Value Ref Range Status   SARS Coronavirus 2 by RT PCR NEGATIVE NEGATIVE Final    Comment: (NOTE) SARS-CoV-2 target nucleic acids are NOT DETECTED.  The SARS-CoV-2 RNA is generally detectable in upper respiratory specimens during the acute phase of infection. The lowest concentration of SARS-CoV-2 viral copies this assay can detect is 138 copies/mL. Anhar Mcdermott negative result does not preclude SARS-Cov-2 infection and should not be used as the sole basis for treatment or other patient management decisions. Brittanee Ghazarian negative result may occur with  improper specimen collection/handling, submission of specimen other than nasopharyngeal swab, presence of viral mutation(s) within the areas targeted by this assay, and inadequate number of viral copies(<138 copies/mL). Baraa Tubbs negative result must be combined with clinical observations, patient history, and epidemiological information. The expected result is Negative.  Fact Sheet for Patients:  EntrepreneurPulse.com.au  Fact Sheet for Healthcare Providers:  IncredibleEmployment.be  This test is no t yet approved or cleared by the [place] FDA and  has been authorized for detection and/or diagnosis of SARS-CoV-2 by FDA under an Emergency Use Authorization (EUA). This EUA will remain  in effect (meaning this test  can be used) for the duration of the COVID-19 declaration under Section 564(b)(1) of the Act, 21 U.S.C.section 360bbb-3(b)(1), unless the authorization is terminated  or revoked sooner.       Influenza Luberta Grabinski by PCR NEGATIVE NEGATIVE Final   Influenza B by PCR NEGATIVE NEGATIVE Final    Comment: (NOTE) The Xpert Xpress SARS-CoV-2/FLU/RSV plus assay is intended as an aid in  the diagnosis of influenza from Nasopharyngeal swab specimens and should not be used as Bracha Frankowski sole basis for treatment. Nasal washings and aspirates are unacceptable for Xpert Xpress SARS-CoV-2/FLU/RSV testing.  Fact Sheet for Patients: EntrepreneurPulse.com.au  Fact Sheet for Healthcare Providers: IncredibleEmployment.be  This test is not yet approved or cleared by the [place] FDA and has been authorized for detection and/or diagnosis of SARS-CoV-2 by FDA under an Emergency Use Authorization (EUA). This EUA will remain in effect (meaning this test can be used) for the duration of the COVID-19 declaration under Section 564(b)(1) of the Act, 21 U.S.C. section 360bbb-3(b)(1), unless the authorization is terminated or revoked.  Performed at [facility], [address], [place], [place] [address]          Radiology Studies: No results found.      Scheduled Meds:  amLODipine  10 mg Oral Daily   aspirin  81 mg Oral Daily   atorvastatin  20 mg Oral QHS   Chlorhexidine Gluconate Cloth  6 each Topical Q0600   cloNIDine  0.3 mg Oral BID   clopidogrel  75 mg Oral Daily   epoetin (EPOGEN/PROCRIT) injection  4,000 Units Intravenous Q T,Th,Sa-HD   feeding supplement (NEPRO CARB STEADY)  237 mL Oral BID BM   gabapentin  100 mg Oral QHS   heparin  5,000 Units Subcutaneous Q8H   hydrALAZINE  100 mg Oral Q6H   insulin aspart  0-15 Units Subcutaneous TID AC & HS   insulin aspart  5 Units Subcutaneous TID WC   insulin glargine  10 Units Subcutaneous QHS    isosorbide mononitrate  60 mg Oral Daily   levETIRAcetam  500 mg Oral Q24H   metoprolol tartrate  50 mg Oral BID   multivitamin  1 tablet Oral QHS   nystatin  1 application Topical BID   pantoprazole  40 mg Oral Daily   polyethylene glycol  17 g Oral Daily   senna-docusate  2 tablet Oral BID   torsemide  20 mg Oral Daily   vitamin B-12  250 mcg Oral Daily   Continuous Infusions:  sodium chloride     sodium chloride Stopped (01/06/21 1455)     LOS: 31 days    Time spent: over 30 min    Fayrene Helper, MD [facility]   To contact the attending provider between 7A-7P or the covering provider during after hours 7P-7A, please log into the web site [email or web address] and access using universal [place] password for that web site. If you do not have the password, please call the hospital operator.  01/16/2021, 5:04 PM

## 2021-01-16 NOTE — Progress Notes (Signed)
[facility]  ROUNDING NOTE   Subjective:   Patient sleeping, breakfast tray at bedside Room air Dialysis yesterday, tolerated well    Objective:  Vital signs in last 24 hours:  Temp:  [97.5 F (36.4 C)-98.5 F (36.9 C)] 98.5 F (36.9 C) (07/03 0544) Pulse Rate:  [67-83] 68 (07/03 0544) Resp:  [14-23] 20 (07/03 0544) BP: (104-185)/(52-91) 150/52 (07/03 0544) SpO2:  [94 %-98 %] 96 % (07/03 0544) Weight:  [89.7 kg] 89.7 kg (07/03 0544)  Weight change:  Filed Weights   01/12/21 0650 01/13/21 0500 01/16/21 0544  Weight: 92.8 kg 94.7 kg 89.7 kg    Intake/Output: I/O last 3 completed shifts: In: 240 [P.O.:240] Out: 2700 [Urine:1700; Other:1000]   Intake/Output this shift:  No intake/output data recorded.  Physical Exam: General: NAD. Resting in bed  Head: Normocephalic, atraumatic.moist oral mucosal membranes  Eyes: Anicteric  Lungs:  Clear , normal effort  Heart: regular  Abdomen:  Soft, nontender, non distended  Extremities:  No peripheral edema.  Neurologic:  Alert and oriented  Skin: No acute lesions or rashes  Access: RIJ permcath    Basic Metabolic Panel: Recent Labs  Lab 01/12/21 0551 01/13/21 0411 01/14/21 0507 01/15/21 0355 01/16/21 0008 01/16/21 0357  NA 137 136 136 136  --  133*  K 3.8 4.0 3.8 3.6  --  3.8  CL 99 101 98 99  --  96*  CO2 28 28 30 28   --  31  GLUCOSE 98 261* 100* 117*  --  180*  BUN 34* 45* 29* 45*  --  24*  CREATININE 2.73* 3.43* 2.21* 3.13*  --  2.19*  CALCIUM 8.5* 8.1* 8.5* 8.6*  --  8.5*  MG  --  2.3 2.1 2.1  --  1.9  PHOS 4.4 5.4* 4.2 4.6 3.0 3.7     Liver Function Tests: Recent Labs  Lab 01/12/21 0551 01/13/21 0411 01/14/21 0507 01/15/21 0355 01/16/21 0357  AST  --  24 25 19 23   ALT  --  15 14 13 13   ALKPHOS  --  63 61 58 60  BILITOT  --  0.6 0.6 0.5 0.5  PROT  --  5.4* 5.8* 5.5* 5.8*  ALBUMIN 2.8* 2.7* 2.9* 2.7* 2.8*    No results for input(s): LIPASE, AMYLASE in the last 168 hours. No  results for input(s): AMMONIA in the last 168 hours.  CBC: Recent Labs  Lab 01/12/21 0551 01/13/21 0411 01/14/21 0507 01/15/21 0355 01/16/21 0357  WBC 6.9 6.0 7.4 6.4 6.5  NEUTROABS 3.9 3.3 4.2 3.6 4.2  HGB 8.4* 7.9* 8.4* 8.1* 8.8*  HCT 25.4* 23.8* 25.9* 25.1* 27.1*  MCV 90.1 91.2 91.5 93.7 91.6  PLT 200 197 221 216 252     Cardiac Enzymes: No results for input(s): CKTOTAL, CKMB, CKMBINDEX, TROPONINI in the last 168 hours.  BNP: Invalid input(s): POCBNP  CBG: Recent Labs  Lab 01/15/21 0746 01/15/21 1227 01/15/21 1654 01/15/21 2041 01/16/21 0741  GLUCAP 113* 131* 262* 131* 205*     Microbiology: Results for orders placed or performed during the hospital encounter of 12/14/20  SARS CORONAVIRUS 2 (TAT 6-24 HRS) Nasopharyngeal Nasopharyngeal Swab     Status: None   Collection Time: 12/14/20  8:28 PM   Specimen: Nasopharyngeal Swab  Result Value Ref Range Status   SARS Coronavirus 2 NEGATIVE NEGATIVE Final    Comment: (NOTE) SARS-CoV-2 target nucleic acids are NOT DETECTED.  The SARS-CoV-2 RNA is generally detectable in upper and lower respiratory  specimens during the acute phase of infection. Negative results do not preclude SARS-CoV-2 infection, do not rule out co-infections with other pathogens, and should not be used as the sole basis for treatment or other patient management decisions. Negative results must be combined with clinical observations, patient history, and epidemiological information. The expected result is Negative.  Fact Sheet for Patients: SugarRoll.be  Fact Sheet for Healthcare Providers: https://www.woods-mathews.com/  This test is not yet approved or cleared by the [place] FDA and  has been authorized for detection and/or diagnosis of SARS-CoV-2 by FDA under an Emergency Use Authorization (EUA). This EUA will remain  in effect (meaning this test can be used) for the duration of  the COVID-19 declaration under Se ction 564(b)(1) of the Act, 21 U.S.C. section 360bbb-3(b)(1), unless the authorization is terminated or revoked sooner.  Performed at [facility], [place] [address] [place], [place] [address]   SARS CORONAVIRUS 2 (TAT 6-24 HRS) Nasopharyngeal Nasopharyngeal Swab     Status: None   Collection Time: 12/19/20  4:40 PM   Specimen: Nasopharyngeal Swab  Result Value Ref Range Status   SARS Coronavirus 2 NEGATIVE NEGATIVE Final    Comment: (NOTE) SARS-CoV-2 target nucleic acids are NOT DETECTED.  The SARS-CoV-2 RNA is generally detectable in upper and lower respiratory specimens during the acute phase of infection. Negative results do not preclude SARS-CoV-2 infection, do not rule out co-infections with other pathogens, and should not be used as the sole basis for treatment or other patient management decisions. Negative results must be combined with clinical observations, patient history, and epidemiological information. The expected result is Negative.  Fact Sheet for Patients: SugarRoll.be  Fact Sheet for Healthcare Providers: https://www.woods-mathews.com/  This test is not yet approved or cleared by the [place] FDA and  has been authorized for detection and/or diagnosis of SARS-CoV-2 by FDA under an Emergency Use Authorization (EUA). This EUA will remain  in effect (meaning this test can be used) for the duration of the COVID-19 declaration under Se ction 564(b)(1) of the Act, 21 U.S.C. section 360bbb-3(b)(1), unless the authorization is terminated or revoked sooner.  Performed at [facility], [place] [address] [place], [place] [address]   Urine Culture     Status: Abnormal   Collection Time: 01/06/21  4:22 AM   Specimen: Urine, Random  Result Value Ref Range Status   Specimen Description   Final    URINE, RANDOM Performed at [facility], [address] [place],  [place] [address]    Special Requests   Final    NONE Performed at [facility], [address] [place], [place] [address]    Culture (A)  Final    70,000 COLONIES/mL MULTIPLE SPECIES PRESENT, SUGGEST RECOLLECTION   Report Status 01/07/2021 FINAL  Final  CULTURE, BLOOD (ROUTINE X 2) w Reflex to ID Panel     Status: None   Collection Time: 01/06/21  8:19 AM   Specimen: BLOOD  Result Value Ref Range Status   Specimen Description BLOOD RIGHT [facility]  Final   Special Requests   Final    BOTTLES DRAWN AEROBIC AND ANAEROBIC Blood Culture adequate volume   Culture   Final    NO GROWTH 5 DAYS Performed at [facility], [address] [place], [place] [address]    Report Status 01/11/2021 FINAL  Final  CULTURE, BLOOD (ROUTINE X 2) w Reflex to ID Panel     Status: None   Collection Time:  01/06/21  8:21 AM   Specimen: BLOOD  Result Value Ref Range Status   Specimen Description BLOOD RIGHT HAND  Final   Special Requests   Final    BOTTLES DRAWN AEROBIC AND ANAEROBIC Blood Culture adequate volume   Culture   Final    NO GROWTH 5 DAYS Performed at [facility], [address] [place], [place] [address]    Report Status 01/11/2021 FINAL  Final  Resp Panel by RT-PCR (Flu A&B, Covid) Nasopharyngeal Swab     Status: None   Collection Time: 01/13/21  1:32 PM   Specimen: Nasopharyngeal Swab; Nasopharyngeal(NP) swabs in vial transport medium  Result Value Ref Range Status   SARS Coronavirus 2 by RT PCR NEGATIVE NEGATIVE Final    Comment: (NOTE) SARS-CoV-2 target nucleic acids are NOT DETECTED.  The SARS-CoV-2 RNA is generally detectable in upper respiratory specimens during the acute phase of infection. The lowest concentration of SARS-CoV-2 viral copies this assay can detect is 138 copies/mL. A negative result does not preclude SARS-Cov-2 infection and should not be used as the sole basis for treatment or other patient management decisions. A negative result may occur  with  improper specimen collection/handling, submission of specimen other than nasopharyngeal swab, presence of viral mutation(s) within the areas targeted by this assay, and inadequate number of viral copies(<138 copies/mL). A negative result must be combined with clinical observations, patient history, and epidemiological information. The expected result is Negative.  Fact Sheet for Patients:  EntrepreneurPulse.com.au  Fact Sheet for Healthcare Providers:  IncredibleEmployment.be  This test is no t yet approved or cleared by the [place] FDA and  has been authorized for detection and/or diagnosis of SARS-CoV-2 by FDA under an Emergency Use Authorization (EUA). This EUA will remain  in effect (meaning this test can be used) for the duration of the COVID-19 declaration under Section 564(b)(1) of the Act, 21 U.S.C.section 360bbb-3(b)(1), unless the authorization is terminated  or revoked sooner.       Influenza A by PCR NEGATIVE NEGATIVE Final   Influenza B by PCR NEGATIVE NEGATIVE Final    Comment: (NOTE) The Xpert Xpress SARS-CoV-2/FLU/RSV plus assay is intended as an aid in the diagnosis of influenza from Nasopharyngeal swab specimens and should not be used as a sole basis for treatment. Nasal washings and aspirates are unacceptable for Xpert Xpress SARS-CoV-2/FLU/RSV testing.  Fact Sheet for Patients: EntrepreneurPulse.com.au  Fact Sheet for Healthcare Providers: IncredibleEmployment.be  This test is not yet approved or cleared by the [place] FDA and has been authorized for detection and/or diagnosis of SARS-CoV-2 by FDA under an Emergency Use Authorization (EUA). This EUA will remain in effect (meaning this test can be used) for the duration of the COVID-19 declaration under Section 564(b)(1) of the Act, 21 U.S.C. section 360bbb-3(b)(1), unless the authorization is terminated  or revoked.  Performed at [facility], [address] [place], [place] [address]     Coagulation Studies: No results for input(s): LABPROT, INR in the last 72 hours.  Urinalysis: No results for input(s): COLORURINE, LABSPEC, PHURINE, GLUCOSEU, HGBUR, BILIRUBINUR, KETONESUR, PROTEINUR, UROBILINOGEN, NITRITE, LEUKOCYTESUR in the last 72 hours.  Invalid input(s): APPERANCEUR     Imaging: No results found.   Medications:    sodium chloride     sodium chloride Stopped (01/06/21 1455)    amLODipine  10 mg Oral Daily   aspirin  81 mg Oral Daily   atorvastatin  20 mg Oral QHS   Chlorhexidine Gluconate Cloth  6 each Topical Q0600   cloNIDine  0.3 mg Oral BID   clopidogrel  75 mg Oral Daily   epoetin (EPOGEN/PROCRIT) injection  4,000 Units Intravenous Q T,Th,Sa-HD   feeding supplement (NEPRO CARB STEADY)  237 mL Oral BID BM   gabapentin  100 mg Oral QHS   heparin  5,000 Units Subcutaneous Q8H   hydrALAZINE  100 mg Oral Q6H   insulin aspart  0-15 Units Subcutaneous TID AC & HS   insulin aspart  5 Units Subcutaneous TID WC   insulin glargine  10 Units Subcutaneous QHS   isosorbide mononitrate  60 mg Oral Daily   levETIRAcetam  500 mg Oral Q24H   metoprolol tartrate  50 mg Oral BID   multivitamin  1 tablet Oral QHS   nystatin  1 application Topical BID   pantoprazole  40 mg Oral Daily   polyethylene glycol  17 g Oral Daily   senna-docusate  2 tablet Oral BID   torsemide  20 mg Oral Daily   vitamin B-12  250 mcg Oral Daily   sodium chloride, sodium chloride, acetaminophen **OR** acetaminophen, alteplase, heparin, hydrALAZINE, HYDROcodone-acetaminophen, hydrocortisone, iohexol, lidocaine (PF), lidocaine-prilocaine, ondansetron **OR** ondansetron (ZOFRAN) IV, pentafluoroprop-tetrafluoroeth, senna-docusate  Assessment/ Plan:  [name] is a 65 y.o. white female with diabetes mellitus type II, hypertension, CVA with residual left hemiplegia,  hyperlipidemia, congestive heart failure who was admitted to [facility] on 12/14/2020 for AKI (acute kidney injury) ([place]) [N17.9] Altered mental status, unspecified altered mental status type [R41.82] Acute kidney injury superimposed on chronic kidney disease ([place]) [Z02.5, E52.7]  Complicated and prolonged hospitalization. 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  Recent acute kidney injury secondary to ATN, prerenal azotemia and uncontrolled hypertension. Inconsistent recovery and results of 24 hours urine creatinine clearance indicate ESRD Initiated on dialysis on 12/31/20 -Received dialysis yesterday, tolerated well - UF goal 1L acheived -Next treatment scheduled for Tuesday - Outpatient arranged for [facility], TTS.   Lab Results  Component Value Date   CREATININE 2.19 (H) 01/16/2021   CREATININE 3.13 (H) 01/15/2021   CREATININE 2.21 (H) 01/14/2021    Intake/Output Summary (Last 24 hours) at 01/16/2021 0935 Last data filed at 01/16/2021 0300 Gross per 24 hour  Intake 240 ml  Output 2100 ml  Net -1860 ml     2. Hypertension: 150/52. Volume status at goal.   Current regimen of amlodipine, clonidine, hydralazine, isosorbide mononitrate, metoprolol and torsemide. BP continues to fluctuate. Will monitor  3. 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  Hgb A1c 8.6% on 11/29/20 Glucose elevated at times Ssi managed by primary team  4. Anemia of CKD with iron deficiency.  Lab Results  Component Value Date   HGB 8.8 (L) 01/16/2021   - will continue to monitor - EPO 4000 units with dialysis   LOS: [place] 7/3/20229:35 AM

## 2021-01-17 LAB — CBC WITH DIFFERENTIAL/PLATELET
Abs Immature Granulocytes: 0.07 10*3/uL (ref 0.00–0.07)
Basophils Absolute: 0 10*3/uL (ref 0.0–0.1)
Basophils Relative: 1 %
Eosinophils Absolute: 0.4 10*3/uL (ref 0.0–0.5)
Eosinophils Relative: 5 %
HCT: 25.3 % — ABNORMAL LOW (ref 36.0–46.0)
Hemoglobin: 8.5 g/dL — ABNORMAL LOW (ref 12.0–15.0)
Immature Granulocytes: 1 %
Lymphocytes Relative: 19 %
Lymphs Abs: 1.5 10*3/uL (ref 0.7–4.0)
MCH: 30.6 pg (ref 26.0–34.0)
MCHC: 33.6 g/dL (ref 30.0–36.0)
MCV: 91 fL (ref 80.0–100.0)
Monocytes Absolute: 1.1 10*3/uL — ABNORMAL HIGH (ref 0.1–1.0)
Monocytes Relative: 15 %
Neutro Abs: 4.5 10*3/uL (ref 1.7–7.7)
Neutrophils Relative %: 59 %
Platelets: 235 10*3/uL (ref 150–400)
RBC: 2.78 MIL/uL — ABNORMAL LOW (ref 3.87–5.11)
RDW: 15 % (ref 11.5–15.5)
WBC: 7.6 10*3/uL (ref 4.0–10.5)
nRBC: 0 % (ref 0.0–0.2)

## 2021-01-17 LAB — COMPREHENSIVE METABOLIC PANEL
ALT: 11 U/L (ref 0–44)
AST: 17 U/L (ref 15–41)
Albumin: 2.7 g/dL — ABNORMAL LOW (ref 3.5–5.0)
Alkaline Phosphatase: 51 U/L (ref 38–126)
Anion gap: 9 (ref 5–15)
BUN: 45 mg/dL — ABNORMAL HIGH (ref 8–23)
CO2: 28 mmol/L (ref 22–32)
Calcium: 8.7 mg/dL — ABNORMAL LOW (ref 8.9–10.3)
Chloride: 98 mmol/L (ref 98–111)
Creatinine, Ser: 3.02 mg/dL — ABNORMAL HIGH (ref 0.44–1.00)
GFR, Estimated: 17 mL/min — ABNORMAL LOW (ref >60)
Glucose, Bld: 107 mg/dL — ABNORMAL HIGH (ref 70–99)
Potassium: 3.9 mmol/L (ref 3.5–5.1)
Sodium: 135 mmol/L (ref 135–145)
Total Bilirubin: 0.5 mg/dL (ref 0.3–1.2)
Total Protein: 5.6 g/dL — ABNORMAL LOW (ref 6.5–8.1)

## 2021-01-17 LAB — GLUCOSE, CAPILLARY
Glucose-Capillary: 119 mg/dL — ABNORMAL HIGH (ref 70–99)
Glucose-Capillary: 289 mg/dL — ABNORMAL HIGH (ref 70–99)
Glucose-Capillary: 223 mg/dL — ABNORMAL HIGH (ref 70–99)
Glucose-Capillary: 179 mg/dL — ABNORMAL HIGH (ref 70–99)

## 2021-01-17 LAB — PHOSPHORUS: Phosphorus: 5.5 mg/dL — ABNORMAL HIGH (ref 2.5–4.6)

## 2021-01-17 LAB — MAGNESIUM: Magnesium: 2 mg/dL (ref 1.7–2.4)

## 2021-01-17 NOTE — Plan of Care (Signed)
  Problem: Clinical Measurements: Goal: Ability to maintain clinical measurements within normal limits will improve Outcome: Progressing Goal: Will remain free from infection Outcome: Progressing Goal: Diagnostic test results will improve Outcome: Progressing Goal: Respiratory complications will improve Outcome: Progressing Goal: Cardiovascular complication will be avoided Outcome: Progressing   Pt is alert and oriented. V/S stable except bp 109/50. No complaints of pain at this time.

## 2021-01-17 NOTE — Progress Notes (Signed)
PROGRESS NOTE    Susan House  XTA:[id number] DOB: 1955-10-09 DOA: 12/14/2020 PCP: Jerrol Banana., MD   Chief Complaint  Patient presents with   Weakness    Brief Narrative:  65 y.o. female with medical history significant for history of CVA with residual left-sided weakness, left frontal encephalomalacia, CKD stage IIIb-IV, insulin-dependent type 2 diabetes, hypertension, hyperlipidemia, seizure disorder, and memory loss who presents to the ED for evaluation of generalized weakness and abnormal labs, found to have AKI on CKD and evidence of urinary tract infection.  Patient admitted for further management.  Due to worsening kidney function, nephrology consulted, HD was started on 12/31/2020.  Patient still making urine and BP still remains labile.  Stable for discharge at this time, unfortunately, can't be new start dialysis over weekend.  Facility unable to take her until Wednesday (July 6).   Assessment & Plan:   Principal Problem:   Acute kidney injury superimposed on chronic kidney disease ([place]) Active Problems:   Hypertension associated with diabetes ([place])   Type 2 diabetes mellitus with complications ([facility])   Hemiparesis due to old cerebrovascular accident ([place])   Hyperlipidemia associated with type 2 diabetes mellitus ([place])   Hx of completed stroke   AKI (acute kidney injury) ([place])   Protein-calorie malnutrition, severe  Stable awaiting discharge.  Discharge Tuesday or Wednesday.  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  CT abdomen pelvis did not show this mass Patient has outpatient HD set up already for T/TH/S 6/29 she mentioned that she may not want long term dialysis.  Discussed with Dr. Candiss Norse who noted expectation that she'll probably need long term dialysis.  I  encouraged Susan House to take her time and not rush regarding decision given importance.  She was appreciative of this.  Encouraged continued discussion of this with family and renal going forward.   Acute metabolic encephalopathy Improved Likely 2/2 ?UTI Vs ??  Postictal state, ??  Seizure Recently completed 3 days of Rocephin for UTI Procalcitonin 1.02-->1.44 UA with large leukocytes, many bacteria, > 50 WBC UC with multiple species BC X 2 NGTD CXR with atelectasis, otherwise unremarkable Completed 5 days of IV Rocephin Code stroke was activated, CT head negative for any acute intracranial abnormality Neurology consulted, appreciate recs, no further recs - suspected encephalopathy due to infection   Hypokalemia Replace as needed   Refractory/labile hypertension  Continue current regimen of amlodipine, clonidine, hydralazine, metoprolol, imdur, torsemide   Anemia of chronic kidney disease and B12 deficiency Hemoglobin stable s/p 1 PRBC Continue B12 supplementation   Insulin-dependent type 2 diabetes, uncontrolled A1c 8.6% SSI, Lantus, NovoLog 3 times daily, Accu-Cheks, hypoglycemic protocol   History of CVA with residual left-sided weakness/left frontal encephalomalacia Continue aspirin, Plavix, atorvastatin.   Seizure disorder Continue Keppra Seizure precaution   Memory loss Suspected underlying vascular dementia.   She had some reported confusion/delirium prior to arrival.   Baseline level of mentation is unclear CT head significant for chronic volume loss and small vessel ischemic changes consistent with suspected vascular dementia Outpatient follow-up   Constipation Continue Senokot-S and MiraLAX  DVT prophylaxis: heparin Code Status: DNR Family Communication: husband at bedside Disposition:   Status is: Inpatient  Remains inpatient appropriate because:Inpatient level of care appropriate due to severity of illness  Dispo: The  patient is from: Home               Anticipated d/c is to: SNF              Patient currently is not medically stable to d/c.   Difficult to place patient No  Consultants:  Renal  Neurology Vascular   Procedures:  6/17 PROCEDURE: Ultrasound guidance for vascular access to the right internal jugular vein Fluoroscopic guidance for placement of catheter Placement of Ibeth Fahmy 19 cm tip to cuff tunneled hemodialysis catheter via the right internal jugular vein  6/8 Renal artery US IMPRESSION: 1. Renal artery velocities within normal limits. 2. Nonvisualization of the abdominal aorta, therefore renal artery-aorta ratios could not be measured. 3. Partially visualized cystic mass noted in the right abdomen measuring 10.8 x 5.6 cm is of uncertain etiology. Further evaluation with CT of the abdomen and pelvis should be performed to better evaluate this structure.   These results will be called to the ordering clinician or representative by the Radiologist Assistant, and communication documented in the PACS or [employer].  Antimicrobials:  Anti-infectives (From admission, onward)    Start     Dose/Rate Route Frequency Ordered Stop   01/06/21 0845  cefTRIAXone (ROCEPHIN) 1 g in sodium chloride 0.9 % 100 mL IVPB  Status:  Discontinued        1 g 200 mL/hr over 30 Minutes Intravenous Every 24 hours 01/06/21 0751 01/10/21 1557   12/31/20 0900  ceFAZolin (ANCEF) powder 1 g  Status:  Discontinued        1 g Other To Surgery 12/31/20 0801 12/31/20 0808   12/31/20 0752  sodium chloride 0.9 % with ceFAZolin (ANCEF) ADS Med       Note to Pharmacy: Corlis Hove   : cabinet override      12/31/20 0752 12/31/20 1959   12/31/20 0749  ceFAZolin (ANCEF) IVPB 2g/100 mL premix  Status:  Discontinued        2 g 200 mL/hr over 30 Minutes Intravenous 30 min pre-op 12/31/20 0749 12/31/20 1003   12/15/20 0800  cefTRIAXone (ROCEPHIN) 1 g in sodium chloride 0.9 % 100 mL IVPB        1 g 200 mL/hr over 30 Minutes Intravenous Every  24 hours 12/15/20 0713 12/17/20 1034       Subjective: Feels well, no complaints  Objective: Vitals:   01/16/21 2223 01/17/21 0315 01/17/21 0500 01/17/21 0815  BP: (!) 131/43 (!) 109/50  (!) 132/50  Pulse: 64 62  (!) 59  Resp:  18  18  Temp:  98 F (36.7 C)  98.7 F (37.1 C)  TempSrc:    Oral  SpO2:  98%  97%  Weight:   90.7 kg   Height:        Intake/Output Summary (Last 24 hours) at 01/17/2021 1452 Last data filed at 01/17/2021 1019 Gross per 24 hour  Intake 120 ml  Output 900 ml  Net -780 ml   Filed Weights   01/13/21 0500 01/16/21 0544 01/17/21 0500  Weight: 94.7 kg 89.7 kg 90.7 kg    Examination:  General: No acute distress. Cardiovascular: RRR Lungs:  unlabored Abdomen: Soft, nontender, nondistended Neurological: Alert and oriented 3. Moves all extremities 4 . Cranial nerves II through XII grossly intact. Skin: Warm and dry. No rashes or lesions. Extremities: No clubbing or cyanosis. No edema.    Data Reviewed: I have personally reviewed following labs and imaging studies  CBC: Recent Labs  Lab 01/13/21 0411 01/14/21 0507 01/15/21 0355 01/16/21 0357 01/17/21 0430  WBC 6.0 7.4 6.4 6.5 7.6  NEUTROABS 3.3 4.2 3.6 4.2 4.5  HGB 7.9* 8.4* 8.1* 8.8* 8.5*  HCT 23.8* 25.9* 25.1* 27.1* 25.3*  MCV 91.2 91.5 93.7 91.6 91.0  PLT 197 221 216 252 626    Basic Metabolic Panel: Recent Labs  Lab 01/13/21 0411 01/14/21 0507 01/15/21 0355 01/16/21 0008 01/16/21 0357 01/17/21 0430  NA 136 136 136  --  133* 135  K 4.0 3.8 3.6  --  3.8 3.9  CL 101 98 99  --  96* 98  CO2 28 30 28   --  31 28  GLUCOSE 261* 100* 117*  --  180* 107*  BUN 45* 29* 45*  --  24* 45*  CREATININE 3.43* 2.21* 3.13*  --  2.19* 3.02*  CALCIUM 8.1* 8.5* 8.6*  --  8.5* 8.7*  MG 2.3 2.1 2.1  --  1.9 2.0  PHOS 5.4* 4.2 4.6 3.0 3.7 5.5*    GFR: Estimated Creatinine Clearance: 19.7 mL/min (Dontavis Tschantz) (by C-G formula based on SCr of 3.02 mg/dL (H)).  Liver Function Tests: Recent Labs  Lab  01/13/21 0411 01/14/21 0507 01/15/21 0355 01/16/21 0357 01/17/21 0430  AST 24 25 19 23 17   ALT 15 14 13 13 11   ALKPHOS 63 61 58 60 51  BILITOT 0.6 0.6 0.5 0.5 0.5  PROT 5.4* 5.8* 5.5* 5.8* 5.6*  ALBUMIN 2.7* 2.9* 2.7* 2.8* 2.7*    CBG: Recent Labs  Lab 01/16/21 1220 01/16/21 1626 01/16/21 2041 01/17/21 0730 01/17/21 1138  GLUCAP 165* 112* 125* 119* 289*     Recent Results (from the past 240 hour(s))  Resp Panel by RT-PCR (Flu Lajuan Godbee&B, Covid) Nasopharyngeal Swab     Status: None   Collection Time: 01/13/21  1:32 PM   Specimen: Nasopharyngeal Swab; Nasopharyngeal(NP) swabs in vial transport medium  Result Value Ref Range Status   SARS Coronavirus 2 by RT PCR NEGATIVE NEGATIVE Final    Comment: (NOTE) SARS-CoV-2 target nucleic acids are NOT DETECTED.  The SARS-CoV-2 RNA is generally detectable in upper respiratory specimens during the acute phase of infection. The lowest concentration of SARS-CoV-2 viral copies this assay can detect is 138 copies/mL. Darrin Apodaca negative result does not preclude SARS-Cov-2 infection and should not be used as the sole basis for treatment or other patient management decisions. Ermagene Saidi negative result may occur with  improper specimen collection/handling, submission of specimen other than nasopharyngeal swab, presence of viral mutation(s) within the areas targeted by this assay, and inadequate number of viral copies(<138 copies/mL). Sotirios Navarro negative result must be combined with clinical observations, patient history, and epidemiological information. The expected result is Negative.  Fact Sheet for Patients:  EntrepreneurPulse.com.au  Fact Sheet for Healthcare Providers:  IncredibleEmployment.be  This test is no t yet approved or cleared by the [place] FDA and  has been authorized for detection and/or diagnosis of SARS-CoV-2 by FDA under an Emergency Use Authorization (EUA). This EUA will remain  in effect (meaning  this test can be used) for the duration of the COVID-19 declaration under Section 564(b)(1) of the Act, 21 U.S.C.section 360bbb-3(b)(1), unless the authorization is terminated  or revoked sooner.       Influenza Krystian Younglove by PCR NEGATIVE NEGATIVE Final   Influenza B by PCR NEGATIVE NEGATIVE Final    Comment: (NOTE) The Xpert Xpress SARS-CoV-2/FLU/RSV plus assay is intended as an aid in the diagnosis of influenza from Nasopharyngeal swab specimens and should  not be used as Shawnae Leiva sole basis for treatment. Nasal washings and aspirates are unacceptable for Xpert Xpress SARS-CoV-2/FLU/RSV testing.  Fact Sheet for Patients: EntrepreneurPulse.com.au  Fact Sheet for Healthcare Providers: IncredibleEmployment.be  This test is not yet approved or cleared by the [place] FDA and has been authorized for detection and/or diagnosis of SARS-CoV-2 by FDA under an Emergency Use Authorization (EUA). This EUA will remain in effect (meaning this test can be used) for the duration of the COVID-19 declaration under Section 564(b)(1) of the Act, 21 U.S.C. section 360bbb-3(b)(1), unless the authorization is terminated or revoked.  Performed at [facility], [address] [place], [place] [address]          Radiology Studies: No results found.      Scheduled Meds:  amLODipine  10 mg Oral Daily   aspirin  81 mg Oral Daily   atorvastatin  20 mg Oral QHS   Chlorhexidine Gluconate Cloth  6 each Topical Q0600   cloNIDine  0.3 mg Oral BID   clopidogrel  75 mg Oral Daily   epoetin (EPOGEN/PROCRIT) injection  4,000 Units Intravenous Q T,Th,Sa-HD   feeding supplement (NEPRO CARB STEADY)  237 mL Oral BID BM   gabapentin  100 mg Oral QHS   heparin  5,000 Units Subcutaneous Q8H   hydrALAZINE  100 mg Oral Q6H   insulin aspart  0-15 Units Subcutaneous TID AC & HS   insulin aspart  5 Units Subcutaneous TID WC   insulin glargine  10 Units Subcutaneous QHS    isosorbide mononitrate  60 mg Oral Daily   levETIRAcetam  500 mg Oral Q24H   metoprolol tartrate  50 mg Oral BID   multivitamin  1 tablet Oral QHS   nystatin  1 application Topical BID   pantoprazole  40 mg Oral Daily   polyethylene glycol  17 g Oral Daily   senna-docusate  2 tablet Oral BID   torsemide  20 mg Oral Daily   vitamin B-12  250 mcg Oral Daily   Continuous Infusions:  sodium chloride     sodium chloride Stopped (01/06/21 1455)     LOS: 32 days    Time spent: over 30 min    Fayrene Helper, MD [facility]   To contact the attending provider between 7A-7P or the covering provider during after hours 7P-7A, please log into the web site [email or web address] and access using universal [place] password for that web site. If you do not have the password, please call the hospital operator.  01/17/2021, 2:52 PM

## 2021-01-17 NOTE — TOC Progression Note (Addendum)
Transition of Care [facility]) - Progression Note    Patient Details  Name: Susan House MRN: [id number] Date of Birth: 04-Nov-1955  Transition [facility]) CM/SW [place], LCSW Phone Number: 01/17/2021, 12:41 PM  Clinical Narrative:   Left VM for Magda Paganini at [facility] requesting return call to confirm if they will have a bed Tuesday OR Wednesday for this patient.  2:40- Per Magda Paganini they cannot take patient until Weds. TOC Will need to redo auth tomorrow.  Expected Discharge Plan: [place] Barriers to Discharge: Continued Medical Work up  Expected Discharge Plan and Services Expected Discharge Plan: [place] Acute Care Choice: Resumption of Svcs/PTA Provider Living arrangements for the past 2 months: Single Family Home                                       Social Determinants of Health (SDOH) Interventions    Readmission Risk Interventions No flowsheet data found.

## 2021-01-17 NOTE — Progress Notes (Signed)
[facility]  ROUNDING NOTE   Subjective:   Patient seen and evaluated at bedside. Appears to be in good spirits. Due for dialysis treatment again tomorrow.    Objective:  Vital signs in last 24 hours:  Temp:  [98 F (36.7 C)-98.7 F (37.1 C)] 98.7 F (37.1 C) (07/04 0815) Pulse Rate:  [59-66] 59 (07/04 0815) Resp:  [16-18] 18 (07/04 0815) BP: (109-132)/(38-50) 132/50 (07/04 0815) SpO2:  [97 %-99 %] 97 % (07/04 0815) Weight:  [90.7 kg] 90.7 kg (07/04 0500)  Weight change: 1 kg Filed Weights   01/13/21 0500 01/16/21 0544 01/17/21 0500  Weight: 94.7 kg 89.7 kg 90.7 kg    Intake/Output: I/O last 3 completed shifts: In: 240 [P.O.:240] Out: 2000 [Urine:2000]   Intake/Output this shift:  Total I/O In: 120 [P.O.:120] Out: -   Physical Exam: General: NAD. Resting in bed  Head: Normocephalic, atraumatic.moist oral mucosal membranes  Eyes: Anicteric  Lungs:  Clear, normal effort  Heart: Regular  Abdomen:  Soft, nontender, non distended  Extremities: No peripheral edema.  Neurologic: Alert and oriented, conversant  Skin: No acute lesions or rashes  Access: RIJ permcath    Basic Metabolic Panel: Recent Labs  Lab 01/13/21 0411 01/14/21 0507 01/15/21 0355 01/16/21 0008 01/16/21 0357 01/17/21 0430  NA 136 136 136  --  133* 135  K 4.0 3.8 3.6  --  3.8 3.9  CL 101 98 99  --  96* 98  CO2 28 30 28   --  31 28  GLUCOSE 261* 100* 117*  --  180* 107*  BUN 45* 29* 45*  --  24* 45*  CREATININE 3.43* 2.21* 3.13*  --  2.19* 3.02*  CALCIUM 8.1* 8.5* 8.6*  --  8.5* 8.7*  MG 2.3 2.1 2.1  --  1.9 2.0  PHOS 5.4* 4.2 4.6 3.0 3.7 5.5*     Liver Function Tests: Recent Labs  Lab 01/13/21 0411 01/14/21 0507 01/15/21 0355 01/16/21 0357 01/17/21 0430  AST 24 25 19 23 17   ALT 15 14 13 13 11   ALKPHOS 63 61 58 60 51  BILITOT 0.6 0.6 0.5 0.5 0.5  PROT 5.4* 5.8* 5.5* 5.8* 5.6*  ALBUMIN 2.7* 2.9* 2.7* 2.8* 2.7*    No results for input(s): LIPASE, AMYLASE in  the last 168 hours. No results for input(s): AMMONIA in the last 168 hours.  CBC: Recent Labs  Lab 01/13/21 0411 01/14/21 0507 01/15/21 0355 01/16/21 0357 01/17/21 0430  WBC 6.0 7.4 6.4 6.5 7.6  NEUTROABS 3.3 4.2 3.6 4.2 4.5  HGB 7.9* 8.4* 8.1* 8.8* 8.5*  HCT 23.8* 25.9* 25.1* 27.1* 25.3*  MCV 91.2 91.5 93.7 91.6 91.0  PLT 197 221 216 252 235     Cardiac Enzymes: No results for input(s): CKTOTAL, CKMB, CKMBINDEX, TROPONINI in the last 168 hours.  BNP: Invalid input(s): POCBNP  CBG: Recent Labs  Lab 01/16/21 1220 01/16/21 1626 01/16/21 2041 01/17/21 0730 01/17/21 1138  GLUCAP 165* 112* 125* 119* 43*     Microbiology: Results for orders placed or performed during the hospital encounter of 12/14/20  SARS CORONAVIRUS 2 (TAT 6-24 HRS) Nasopharyngeal Nasopharyngeal Swab     Status: None   Collection Time: 12/14/20  8:28 PM   Specimen: Nasopharyngeal Swab  Result Value Ref Range Status   SARS Coronavirus 2 NEGATIVE NEGATIVE Final    Comment: (NOTE) SARS-CoV-2 target nucleic acids are NOT DETECTED.  The SARS-CoV-2 RNA is generally detectable in upper and lower respiratory specimens during the acute  phase of infection. Negative results do not preclude SARS-CoV-2 infection, do not rule out co-infections with other pathogens, and should not be used as the sole basis for treatment or other patient management decisions. Negative results must be combined with clinical observations, patient history, and epidemiological information. The expected result is Negative.  Fact Sheet for Patients: SugarRoll.be  Fact Sheet for Healthcare Providers: https://www.woods-mathews.com/  This test is not yet approved or cleared by the [place] FDA and  has been authorized for detection and/or diagnosis of SARS-CoV-2 by FDA under an Emergency Use Authorization (EUA). This EUA will remain  in effect (meaning this test can be used) for the  duration of the COVID-19 declaration under Se ction 564(b)(1) of the Act, 21 U.S.C. section 360bbb-3(b)(1), unless the authorization is terminated or revoked sooner.  Performed at [facility], [place] [address], [place], [place] [address]   SARS CORONAVIRUS 2 (TAT 6-24 HRS) Nasopharyngeal Nasopharyngeal Swab     Status: None   Collection Time: 12/19/20  4:40 PM   Specimen: Nasopharyngeal Swab  Result Value Ref Range Status   SARS Coronavirus 2 NEGATIVE NEGATIVE Final    Comment: (NOTE) SARS-CoV-2 target nucleic acids are NOT DETECTED.  The SARS-CoV-2 RNA is generally detectable in upper and lower respiratory specimens during the acute phase of infection. Negative results do not preclude SARS-CoV-2 infection, do not rule out co-infections with other pathogens, and should not be used as the sole basis for treatment or other patient management decisions. Negative results must be combined with clinical observations, patient history, and epidemiological information. The expected result is Negative.  Fact Sheet for Patients: SugarRoll.be  Fact Sheet for Healthcare Providers: https://www.woods-mathews.com/  This test is not yet approved or cleared by the [place] FDA and  has been authorized for detection and/or diagnosis of SARS-CoV-2 by FDA under an Emergency Use Authorization (EUA). This EUA will remain  in effect (meaning this test can be used) for the duration of the COVID-19 declaration under Se ction 564(b)(1) of the Act, 21 U.S.C. section 360bbb-3(b)(1), unless the authorization is terminated or revoked sooner.  Performed at [facility], [place] [address] [place], [place] [address]   Urine Culture     Status: Abnormal   Collection Time: 01/06/21  4:22 AM   Specimen: Urine, Random  Result Value Ref Range Status   Specimen Description   Final    URINE, RANDOM Performed at [facility], [address]  [place], [place] [address]    Special Requests   Final    NONE Performed at [facility], [address] [place], [place] [address]    Culture (A)  Final    70,000 COLONIES/mL MULTIPLE SPECIES PRESENT, SUGGEST RECOLLECTION   Report Status 01/07/2021 FINAL  Final  CULTURE, BLOOD (ROUTINE X 2) w Reflex to ID Panel     Status: None   Collection Time: 01/06/21  8:19 AM   Specimen: BLOOD  Result Value Ref Range Status   Specimen Description BLOOD RIGHT [facility]  Final   Special Requests   Final    BOTTLES DRAWN AEROBIC AND ANAEROBIC Blood Culture adequate volume   Culture   Final    NO GROWTH 5 DAYS Performed at [facility], [address], [place], [place] [address]    Report Status 01/11/2021 FINAL  Final  CULTURE, BLOOD (ROUTINE X 2) w Reflex to ID Panel     Status: None   Collection Time: 01/06/21  8:21 AM  Specimen: BLOOD  Result Value Ref Range Status   Specimen Description BLOOD RIGHT HAND  Final   Special Requests   Final    BOTTLES DRAWN AEROBIC AND ANAEROBIC Blood Culture adequate volume   Culture   Final    NO GROWTH 5 DAYS Performed at [facility], [address] [place], [place] [address]    Report Status 01/11/2021 FINAL  Final  Resp Panel by RT-PCR (Flu A&B, Covid) Nasopharyngeal Swab     Status: None   Collection Time: 01/13/21  1:32 PM   Specimen: Nasopharyngeal Swab; Nasopharyngeal(NP) swabs in vial transport medium  Result Value Ref Range Status   SARS Coronavirus 2 by RT PCR NEGATIVE NEGATIVE Final    Comment: (NOTE) SARS-CoV-2 target nucleic acids are NOT DETECTED.  The SARS-CoV-2 RNA is generally detectable in upper respiratory specimens during the acute phase of infection. The lowest concentration of SARS-CoV-2 viral copies this assay can detect is 138 copies/mL. A negative result does not preclude SARS-Cov-2 infection and should not be used as the sole basis for treatment or other patient management decisions. A negative result  may occur with  improper specimen collection/handling, submission of specimen other than nasopharyngeal swab, presence of viral mutation(s) within the areas targeted by this assay, and inadequate number of viral copies(<138 copies/mL). A negative result must be combined with clinical observations, patient history, and epidemiological information. The expected result is Negative.  Fact Sheet for Patients:  EntrepreneurPulse.com.au  Fact Sheet for Healthcare Providers:  IncredibleEmployment.be  This test is no t yet approved or cleared by the [place] FDA and  has been authorized for detection and/or diagnosis of SARS-CoV-2 by FDA under an Emergency Use Authorization (EUA). This EUA will remain  in effect (meaning this test can be used) for the duration of the COVID-19 declaration under Section 564(b)(1) of the Act, 21 U.S.C.section 360bbb-3(b)(1), unless the authorization is terminated  or revoked sooner.       Influenza A by PCR NEGATIVE NEGATIVE Final   Influenza B by PCR NEGATIVE NEGATIVE Final    Comment: (NOTE) The Xpert Xpress SARS-CoV-2/FLU/RSV plus assay is intended as an aid in the diagnosis of influenza from Nasopharyngeal swab specimens and should not be used as a sole basis for treatment. Nasal washings and aspirates are unacceptable for Xpert Xpress SARS-CoV-2/FLU/RSV testing.  Fact Sheet for Patients: EntrepreneurPulse.com.au  Fact Sheet for Healthcare Providers: IncredibleEmployment.be  This test is not yet approved or cleared by the [place] FDA and has been authorized for detection and/or diagnosis of SARS-CoV-2 by FDA under an Emergency Use Authorization (EUA). This EUA will remain in effect (meaning this test can be used) for the duration of the COVID-19 declaration under Section 564(b)(1) of the Act, 21 U.S.C. section 360bbb-3(b)(1), unless the authorization is terminated  or revoked.  Performed at [facility], [address] [place], [place] [address]     Coagulation Studies: No results for input(s): LABPROT, INR in the last 72 hours.  Urinalysis: No results for input(s): COLORURINE, LABSPEC, PHURINE, GLUCOSEU, HGBUR, BILIRUBINUR, KETONESUR, PROTEINUR, UROBILINOGEN, NITRITE, LEUKOCYTESUR in the last 72 hours.  Invalid input(s): APPERANCEUR     Imaging: No results found.   Medications:    sodium chloride     sodium chloride Stopped (01/06/21 1455)    amLODipine  10 mg Oral Daily   aspirin  81 mg Oral Daily   atorvastatin  20 mg Oral QHS   Chlorhexidine Gluconate Cloth  6 each Topical Q0600  cloNIDine  0.3 mg Oral BID   clopidogrel  75 mg Oral Daily   epoetin (EPOGEN/PROCRIT) injection  4,000 Units Intravenous Q T,Th,Sa-HD   feeding supplement (NEPRO CARB STEADY)  237 mL Oral BID BM   gabapentin  100 mg Oral QHS   heparin  5,000 Units Subcutaneous Q8H   hydrALAZINE  100 mg Oral Q6H   insulin aspart  0-15 Units Subcutaneous TID AC & HS   insulin aspart  5 Units Subcutaneous TID WC   insulin glargine  10 Units Subcutaneous QHS   isosorbide mononitrate  60 mg Oral Daily   levETIRAcetam  500 mg Oral Q24H   metoprolol tartrate  50 mg Oral BID   multivitamin  1 tablet Oral QHS   nystatin  1 application Topical BID   pantoprazole  40 mg Oral Daily   polyethylene glycol  17 g Oral Daily   senna-docusate  2 tablet Oral BID   torsemide  20 mg Oral Daily   vitamin B-12  250 mcg Oral Daily   sodium chloride, sodium chloride, acetaminophen **OR** acetaminophen, alteplase, heparin, hydrALAZINE, HYDROcodone-acetaminophen, hydrocortisone, iohexol, lidocaine (PF), lidocaine-prilocaine, ondansetron **OR** ondansetron (ZOFRAN) IV, pentafluoroprop-tetrafluoroeth, senna-docusate  Assessment/ Plan:  [name] is a 65 y.o. white female with diabetes mellitus type II, hypertension, CVA with residual left hemiplegia,  hyperlipidemia, congestive heart failure who was admitted to [facility] on 12/14/2020 for AKI (acute kidney injury) ([place]) [N17.9] Altered mental status, unspecified altered mental status type [R41.82] Acute kidney injury superimposed on chronic kidney disease ([place]) [J33.5, K56.2]  Complicated and prolonged hospitalization. 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  Recent acute kidney injury secondary to ATN, prerenal azotemia and uncontrolled hypertension. Inconsistent recovery and results of 24 hours urine creatinine clearance indicate ESRD Initiated on dialysis on 12/31/20 -No acute indication for dialysis today.  We will plan for hemodialysis treatment again tomorrow.  Outpatient treatment arranged for [facility].  Lab Results  Component Value Date   CREATININE 3.02 (H) 01/17/2021   CREATININE 2.19 (H) 01/16/2021   CREATININE 3.13 (H) 01/15/2021    Intake/Output Summary (Last 24 hours) at 01/17/2021 1234 Last data filed at 01/17/2021 1019 Gross per 24 hour  Intake 120 ml  Output 900 ml  Net -780 ml    2. Hypertension: Continue amlodipine, clonidine, hydralazine, metoprolol, and torsemide.  3. 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  Hgb A1c 8.6% on 11/29/20 Glucose elevated at times Ssi managed by primary team  4. Anemia of CKD with iron deficiency.  Lab Results  Component Value Date   HGB 8.5 (L) 01/17/2021   -Hemoglobin below goal at the moment.  Continue Epogen 4000 IV with dialysis treatments.   LOS: 32 Amala Petion 7/4/202212:34 PM

## 2021-01-17 NOTE — Progress Notes (Signed)
   01/17/21 1330  Clinical Encounter Type  Visited With Patient and family together  Visit Type Follow-up;Spiritual support;Social support  Referral From Chaplain  Consult/Referral To [facility] did a follow up visit with PT, and with husband who was at bedside. PT spoke highly of the medical staff, and said she was doing much better. Chaplain ministered with prayer, presence, and allowed room for storytelling.

## 2021-01-18 LAB — CBC WITH DIFFERENTIAL/PLATELET
Abs Immature Granulocytes: 0.05 10*3/uL (ref 0.00–0.07)
Basophils Absolute: 0.1 10*3/uL (ref 0.0–0.1)
Basophils Relative: 1 %
Eosinophils Absolute: 0.3 10*3/uL (ref 0.0–0.5)
Eosinophils Relative: 4 %
HCT: 26.1 % — ABNORMAL LOW (ref 36.0–46.0)
Hemoglobin: 8.9 g/dL — ABNORMAL LOW (ref 12.0–15.0)
Immature Granulocytes: 1 %
Lymphocytes Relative: 23 %
Lymphs Abs: 1.8 10*3/uL (ref 0.7–4.0)
MCH: 30.5 pg (ref 26.0–34.0)
MCHC: 34.1 g/dL (ref 30.0–36.0)
MCV: 89.4 fL (ref 80.0–100.0)
Monocytes Absolute: 1 10*3/uL (ref 0.1–1.0)
Monocytes Relative: 13 %
Neutro Abs: 4.5 10*3/uL (ref 1.7–7.7)
Neutrophils Relative %: 58 %
Platelets: 259 10*3/uL (ref 150–400)
RBC: 2.92 MIL/uL — ABNORMAL LOW (ref 3.87–5.11)
RDW: 14.9 % (ref 11.5–15.5)
WBC: 7.7 10*3/uL (ref 4.0–10.5)
nRBC: 0 % (ref 0.0–0.2)

## 2021-01-18 LAB — GLUCOSE, CAPILLARY
Glucose-Capillary: 153 mg/dL — ABNORMAL HIGH (ref 70–99)
Glucose-Capillary: 163 mg/dL — ABNORMAL HIGH (ref 70–99)
Glucose-Capillary: 174 mg/dL — ABNORMAL HIGH (ref 70–99)
Glucose-Capillary: 195 mg/dL — ABNORMAL HIGH (ref 70–99)

## 2021-01-18 LAB — COMPREHENSIVE METABOLIC PANEL
ALT: 11 U/L (ref 0–44)
AST: 17 U/L (ref 15–41)
Albumin: 2.6 g/dL — ABNORMAL LOW (ref 3.5–5.0)
Alkaline Phosphatase: 57 U/L (ref 38–126)
Anion gap: 11 (ref 5–15)
BUN: 62 mg/dL — ABNORMAL HIGH (ref 8–23)
CO2: 26 mmol/L (ref 22–32)
Calcium: 8.4 mg/dL — ABNORMAL LOW (ref 8.9–10.3)
Chloride: 98 mmol/L (ref 98–111)
Creatinine, Ser: 3.64 mg/dL — ABNORMAL HIGH (ref 0.44–1.00)
GFR, Estimated: 13 mL/min — ABNORMAL LOW (ref >60)
Glucose, Bld: 143 mg/dL — ABNORMAL HIGH (ref 70–99)
Potassium: 4.2 mmol/L (ref 3.5–5.1)
Sodium: 135 mmol/L (ref 135–145)
Total Bilirubin: 0.4 mg/dL (ref 0.3–1.2)
Total Protein: 5.6 g/dL — ABNORMAL LOW (ref 6.5–8.1)

## 2021-01-18 LAB — MAGNESIUM: Magnesium: 2.2 mg/dL (ref 1.7–2.4)

## 2021-01-18 LAB — PHOSPHORUS: Phosphorus: 5.9 mg/dL — ABNORMAL HIGH (ref 2.5–4.6)

## 2021-01-18 MED ORDER — HEPARIN SODIUM (PORCINE) 1000 UNIT/ML IJ SOLN
INTRAMUSCULAR | Status: AC
  Filled 2021-01-18: qty 1

## 2021-01-18 MED ORDER — LEVETIRACETAM 250 MG PO TABS
250.0000 mg | ORAL_TABLET | Freq: Once | ORAL | Status: AC
  Administered 2021-01-18: 250 mg via ORAL
  Filled 2021-01-18: qty 1

## 2021-01-18 MED ORDER — SEVELAMER CARBONATE 800 MG PO TABS
800.0000 mg | ORAL_TABLET | Freq: Three times a day (TID) | ORAL | Status: DC
  Administered 2021-01-18 – 2021-01-19 (×3): 800 mg via ORAL
  Filled 2021-01-18 (×2): qty 1

## 2021-01-18 NOTE — Care Management Important Message (Signed)
Important Message  Patient Details  Name: Susan House MRN: [id number] Date of Birth: 1956/04/22   Medicare Important Message Given:  Yes  Patient out of room for dialysis.  Copy of Medicare IM left on window sill.     Dannette Barbara 01/18/2021, 11:39 AM

## 2021-01-18 NOTE — Progress Notes (Addendum)
[facility]  ROUNDING NOTE   Subjective:   Patient seen during dialysis treatment Alert and oriented    HEMODIALYSIS FLOWSHEET:  Blood Flow Rate (mL/min): 400 mL/min Arterial Pressure (mmHg): -170 mmHg Venous Pressure (mmHg): 110 mmHg Transmembrane Pressure (mmHg): 60 mmHg Ultrafiltration Rate (mL/min): 500 mL/min Dialysate Flow Rate (mL/min): 500 ml/min Conductivity: Machine : 13.4 Conductivity: Machine : 13.4 Dialysis Fluid Bolus: Normal Saline Bolus Amount (mL): 250 mL  No complaints at this time   Objective:  Vital signs in last 24 hours:  Temp:  [98.1 F (36.7 C)-99.1 F (37.3 C)] 98.1 F (36.7 C) (07/05 0919) Pulse Rate:  [63-72] 72 (07/05 0919) Resp:  [16-18] 18 (07/05 1200) BP: (130-165)/(44-65) 164/54 (07/05 1200) SpO2:  [96 %-100 %] 97 % (07/05 0733) Weight:  [91 kg] 91 kg (07/05 0500)  Weight change: 0.3 kg Filed Weights   01/16/21 0544 01/17/21 0500 01/18/21 0500  Weight: 89.7 kg 90.7 kg 91 kg    Intake/Output: I/O last 3 completed shifts: In: 597 [P.O.:597] Out: 1400 [Urine:1400]   Intake/Output this shift:  No intake/output data recorded.  Physical Exam: General: NAD  Head: Normocephalic, atraumatic.moist oral mucosal membranes  Eyes: Anicteric  Lungs:  Clear, normal effort  Heart: Regular  Abdomen:  Soft, nontender, non distended  Extremities: No peripheral edema.  Neurologic: Alert and oriented, conversant  Skin: No acute lesions or rashes  Access: RIJ permcath    Basic Metabolic Panel: Recent Labs  Lab 01/14/21 0507 01/15/21 0355 01/16/21 0008 01/16/21 0357 01/17/21 0430 01/18/21 0414  NA 136 136  --  133* 135 135  K 3.8 3.6  --  3.8 3.9 4.2  CL 98 99  --  96* 98 98  CO2 30 28  --  31 28 26   GLUCOSE 100* 117*  --  180* 107* 143*  BUN 29* 45*  --  24* 45* 62*  CREATININE 2.21* 3.13*  --  2.19* 3.02* 3.64*  CALCIUM 8.5* 8.6*  --  8.5* 8.7* 8.4*  MG 2.1 2.1  --  1.9 2.0 2.2  PHOS 4.2 4.6 3.0 3.7 5.5* 5.9*      Liver Function Tests: Recent Labs  Lab 01/14/21 0507 01/15/21 0355 01/16/21 0357 01/17/21 0430 01/18/21 0414  AST 25 19 23 17 17   ALT 14 13 13 11 11   ALKPHOS 61 58 60 51 57  BILITOT 0.6 0.5 0.5 0.5 0.4  PROT 5.8* 5.5* 5.8* 5.6* 5.6*  ALBUMIN 2.9* 2.7* 2.8* 2.7* 2.6*    No results for input(s): LIPASE, AMYLASE in the last 168 hours. No results for input(s): AMMONIA in the last 168 hours.  CBC: Recent Labs  Lab 01/14/21 0507 01/15/21 0355 01/16/21 0357 01/17/21 0430 01/18/21 0414  WBC 7.4 6.4 6.5 7.6 7.7  NEUTROABS 4.2 3.6 4.2 4.5 4.5  HGB 8.4* 8.1* 8.8* 8.5* 8.9*  HCT 25.9* 25.1* 27.1* 25.3* 26.1*  MCV 91.5 93.7 91.6 91.0 89.4  PLT 221 216 252 235 259     Cardiac Enzymes: No results for input(s): CKTOTAL, CKMB, CKMBINDEX, TROPONINI in the last 168 hours.  BNP: Invalid input(s): POCBNP  CBG: Recent Labs  Lab 01/17/21 1138 01/17/21 1635 01/17/21 2201 01/18/21 0734 01/18/21 1258  GLUCAP 289* 223* 179* 153* 163*     Microbiology: Results for orders placed or performed during the hospital encounter of 12/14/20  SARS CORONAVIRUS 2 (TAT 6-24 HRS) Nasopharyngeal Nasopharyngeal Swab     Status: None   Collection Time: 12/14/20  8:28 PM   Specimen:  Nasopharyngeal Swab  Result Value Ref Range Status   SARS Coronavirus 2 NEGATIVE NEGATIVE Final    Comment: (NOTE) SARS-CoV-2 target nucleic acids are NOT DETECTED.  The SARS-CoV-2 RNA is generally detectable in upper and lower respiratory specimens during the acute phase of infection. Negative results do not preclude SARS-CoV-2 infection, do not rule out co-infections with other pathogens, and should not be used as the sole basis for treatment or other patient management decisions. Negative results must be combined with clinical observations, patient history, and epidemiological information. The expected result is Negative.  Fact Sheet for Patients: SugarRoll.be  Fact  Sheet for Healthcare Providers: https://www.woods-mathews.com/  This test is not yet approved or cleared by the [place] FDA and  has been authorized for detection and/or diagnosis of SARS-CoV-2 by FDA under an Emergency Use Authorization (EUA). This EUA will remain  in effect (meaning this test can be used) for the duration of the COVID-19 declaration under Se ction 564(b)(1) of the Act, 21 U.S.C. section 360bbb-3(b)(1), unless the authorization is terminated or revoked sooner.  Performed at [facility], [place] [address] [place], [place] [address]   SARS CORONAVIRUS 2 (TAT 6-24 HRS) Nasopharyngeal Nasopharyngeal Swab     Status: None   Collection Time: 12/19/20  4:40 PM   Specimen: Nasopharyngeal Swab  Result Value Ref Range Status   SARS Coronavirus 2 NEGATIVE NEGATIVE Final    Comment: (NOTE) SARS-CoV-2 target nucleic acids are NOT DETECTED.  The SARS-CoV-2 RNA is generally detectable in upper and lower respiratory specimens during the acute phase of infection. Negative results do not preclude SARS-CoV-2 infection, do not rule out co-infections with other pathogens, and should not be used as the sole basis for treatment or other patient management decisions. Negative results must be combined with clinical observations, patient history, and epidemiological information. The expected result is Negative.  Fact Sheet for Patients: SugarRoll.be  Fact Sheet for Healthcare Providers: https://www.woods-mathews.com/  This test is not yet approved or cleared by the [place] FDA and  has been authorized for detection and/or diagnosis of SARS-CoV-2 by FDA under an Emergency Use Authorization (EUA). This EUA will remain  in effect (meaning this test can be used) for the duration of the COVID-19 declaration under Se ction 564(b)(1) of the Act, 21 U.S.C. section 360bbb-3(b)(1), unless the authorization is terminated  or revoked sooner.  Performed at [facility], [place] [address] [place], [place] [address]   Urine Culture     Status: Abnormal   Collection Time: 01/06/21  4:22 AM   Specimen: Urine, Random  Result Value Ref Range Status   Specimen Description   Final    URINE, RANDOM Performed at [facility], [address], [place], [place] [address]    Special Requests   Final    NONE Performed at [facility], [address] [place], [place] [address]    Culture (A)  Final    70,000 COLONIES/mL MULTIPLE SPECIES PRESENT, SUGGEST RECOLLECTION   Report Status 01/07/2021 FINAL  Final  CULTURE, BLOOD (ROUTINE X 2) w Reflex to ID Panel     Status: None   Collection Time: 01/06/21  8:19 AM   Specimen: BLOOD  Result Value Ref Range Status   Specimen Description BLOOD RIGHT AC  Final   Special Requests   Final    BOTTLES DRAWN AEROBIC AND ANAEROBIC Blood Culture adequate volume   Culture   Final    NO GROWTH 5 DAYS Performed at  [facility], [address] [place], [place] [address]    Report Status 01/11/2021 FINAL  Final  CULTURE, BLOOD (ROUTINE X 2) w Reflex to ID Panel     Status: None   Collection Time: 01/06/21  8:21 AM   Specimen: BLOOD  Result Value Ref Range Status   Specimen Description BLOOD RIGHT HAND  Final   Special Requests   Final    BOTTLES DRAWN AEROBIC AND ANAEROBIC Blood Culture adequate volume   Culture   Final    NO GROWTH 5 DAYS Performed at [facility], [address] [place], [place] [address]    Report Status 01/11/2021 FINAL  Final  Resp Panel by RT-PCR (Flu A&B, Covid) Nasopharyngeal Swab     Status: None   Collection Time: 01/13/21  1:32 PM   Specimen: Nasopharyngeal Swab; Nasopharyngeal(NP) swabs in vial transport medium  Result Value Ref Range Status   SARS Coronavirus 2 by RT PCR NEGATIVE NEGATIVE Final    Comment: (NOTE) SARS-CoV-2 target nucleic acids are NOT DETECTED.  The SARS-CoV-2 RNA is generally  detectable in upper respiratory specimens during the acute phase of infection. The lowest concentration of SARS-CoV-2 viral copies this assay can detect is 138 copies/mL. A negative result does not preclude SARS-Cov-2 infection and should not be used as the sole basis for treatment or other patient management decisions. A negative result may occur with  improper specimen collection/handling, submission of specimen other than nasopharyngeal swab, presence of viral mutation(s) within the areas targeted by this assay, and inadequate number of viral copies(<138 copies/mL). A negative result must be combined with clinical observations, patient history, and epidemiological information. The expected result is Negative.  Fact Sheet for Patients:  EntrepreneurPulse.com.au  Fact Sheet for Healthcare Providers:  IncredibleEmployment.be  This test is no t yet approved or cleared by the [place] FDA and  has been authorized for detection and/or diagnosis of SARS-CoV-2 by FDA under an Emergency Use Authorization (EUA). This EUA will remain  in effect (meaning this test can be used) for the duration of the COVID-19 declaration under Section 564(b)(1) of the Act, 21 U.S.C.section 360bbb-3(b)(1), unless the authorization is terminated  or revoked sooner.       Influenza A by PCR NEGATIVE NEGATIVE Final   Influenza B by PCR NEGATIVE NEGATIVE Final    Comment: (NOTE) The Xpert Xpress SARS-CoV-2/FLU/RSV plus assay is intended as an aid in the diagnosis of influenza from Nasopharyngeal swab specimens and should not be used as a sole basis for treatment. Nasal washings and aspirates are unacceptable for Xpert Xpress SARS-CoV-2/FLU/RSV testing.  Fact Sheet for Patients: EntrepreneurPulse.com.au  Fact Sheet for Healthcare Providers: IncredibleEmployment.be  This test is not yet approved or cleared by the [place] FDA  and has been authorized for detection and/or diagnosis of SARS-CoV-2 by FDA under an Emergency Use Authorization (EUA). This EUA will remain in effect (meaning this test can be used) for the duration of the COVID-19 declaration under Section 564(b)(1) of the Act, 21 U.S.C. section 360bbb-3(b)(1), unless the authorization is terminated or revoked.  Performed at [facility], [address] [place], [place] [address]     Coagulation Studies: No results for input(s): LABPROT, INR in the last 72 hours.  Urinalysis: No results for input(s): COLORURINE, LABSPEC, PHURINE, GLUCOSEU, HGBUR, BILIRUBINUR, KETONESUR, PROTEINUR, UROBILINOGEN, NITRITE, LEUKOCYTESUR in the last 72 hours.  Invalid input(s): APPERANCEUR     Imaging: No results found.   Medications:    sodium chloride  sodium chloride Stopped (01/06/21 1455)    amLODipine  10 mg Oral Daily   aspirin  81 mg Oral Daily   atorvastatin  20 mg Oral QHS   Chlorhexidine Gluconate Cloth  6 each Topical Q0600   cloNIDine  0.3 mg Oral BID   clopidogrel  75 mg Oral Daily   epoetin (EPOGEN/PROCRIT) injection  4,000 Units Intravenous Q T,Th,Sa-HD   feeding supplement (NEPRO CARB STEADY)  237 mL Oral BID BM   gabapentin  100 mg Oral QHS   heparin  5,000 Units Subcutaneous Q8H   heparin sodium (porcine)       heparin sodium (porcine)       heparin sodium (porcine)       hydrALAZINE  100 mg Oral Q6H   insulin aspart  0-15 Units Subcutaneous TID AC & HS   insulin aspart  5 Units Subcutaneous TID WC   insulin glargine  10 Units Subcutaneous QHS   isosorbide mononitrate  60 mg Oral Daily   levETIRAcetam  250 mg Oral Once   levETIRAcetam  500 mg Oral Q24H   metoprolol tartrate  50 mg Oral BID   multivitamin  1 tablet Oral QHS   nystatin  1 application Topical BID   pantoprazole  40 mg Oral Daily   polyethylene glycol  17 g Oral Daily   senna-docusate  2 tablet Oral BID   sevelamer carbonate  800 mg Oral TID WC    torsemide  20 mg Oral Daily   vitamin B-12  250 mcg Oral Daily   sodium chloride, sodium chloride, acetaminophen **OR** acetaminophen, alteplase, heparin, hydrALAZINE, HYDROcodone-acetaminophen, hydrocortisone, iohexol, lidocaine (PF), lidocaine-prilocaine, ondansetron **OR** ondansetron (ZOFRAN) IV, pentafluoroprop-tetrafluoroeth, senna-docusate  Assessment/ Plan:  [name] is a 65 y.o. white female with diabetes mellitus type II, hypertension, CVA with residual left hemiplegia, hyperlipidemia, congestive heart failure who was admitted to [facility] on 12/14/2020 for AKI (acute kidney injury) ([place]) [N17.9] Altered mental status, unspecified altered mental status type [R41.82] Acute kidney injury superimposed on chronic kidney disease ([place]) [N46.2, V03.5]  Complicated and prolonged hospitalization. 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  Recent acute kidney injury secondary to ATN, prerenal azotemia and uncontrolled hypertension. Inconsistent recovery and results of 24 hours urine creatinine clearance indicate ESRD Initiated on dialysis on 12/31/20 -Receiving dialysis today in chair. - Next treatment scheduled for Thursday  Outpatient treatment arranged for [facility].  Lab Results  Component Value Date   CREATININE 3.64 (H) 01/18/2021   CREATININE 3.02 (H) 01/17/2021   CREATININE 2.19 (H) 01/16/2021    Intake/Output Summary (Last 24 hours) at 01/18/2021 1307 Last data filed at 01/17/2021 2005 Gross per 24 hour  Intake 240 ml  Output 500 ml  Net -260 ml    2. Hypertension: Continue amlodipine, clonidine, hydralazine, metoprolol, and torsemide. BP stable for this patient  3. 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  Hgb A1c 8.6% on 11/29/20 Glucose stable Ssi managed by primary team  4. Anemia of CKD with iron deficiency.  Lab Results  Component Value Date   HGB  8.9 (L) 01/18/2021   -Hemoglobin below target.  Continue Epogen IV with treatments.  5. Secondary Hyperparathyroidism:   Lab Results  Component Value Date   CALCIUM 8.4 (L) 01/18/2021   PHOS 5.9 (H) 01/18/2021    Elevated Phosphorus. Will start Renvela 800mg  p.o. with meals   LOS: [place] 7/5/20221:07 PM

## 2021-01-18 NOTE — Progress Notes (Signed)
PROGRESS NOTE    Susan House  SJG:[id number] DOB: 1956/04/19 DOA: 12/14/2020 PCP: Jerrol Banana., MD   Chief Complaint  Patient presents with   Weakness    Brief Narrative:  65 y.o. female with medical history significant for history of CVA with residual left-sided weakness, left frontal encephalomalacia, CKD stage IIIb-IV, insulin-dependent type 2 diabetes, hypertension, hyperlipidemia, seizure disorder, and memory loss who presents to the ED for evaluation of generalized weakness and abnormal labs, found to have AKI on CKD and evidence of urinary tract infection.  Patient admitted for further management.  Due to worsening kidney function, nephrology consulted, HD was started on 12/31/2020.  Patient still making urine and BP still remains labile.  Stable for discharge at this time, unfortunately, can't be new start dialysis over weekend.  Facility unable to take her until Wednesday (July 6).   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  CT abdomen pelvis did not show this mass Patient has outpatient HD set up already for T/TH/S   Acute metabolic encephalopathy Improved Likely due to infection and uremia Recently completed 3 days of Rocephin for UTI Procalcitonin 1.02-->1.44 UA with large leukocytes, many bacteria, > 50 WBC UC with  multiple species BC X 2 NGTD CXR with atelectasis, otherwise unremarkable Completed 5 days of IV Rocephin Code stroke was activated, CT head negative for any acute intracranial abnormality Neurology consulted, appreciate recs, no further recs - suspected encephalopathy due to infection   Hypokalemia Replace as needed   Refractory/labile hypertension  Continue current regimen of amlodipine, clonidine, hydralazine, metoprolol, imdur, torsemide   Anemia of chronic kidney disease and B12 deficiency Hemoglobin stable s/p 1 PRBC Continue B12 supplementation   Insulin-dependent type 2 diabetes, uncontrolled A1c 8.6% SSI, Lantus, NovoLog 3 times daily, Accu-Cheks, hypoglycemic protocol   History of CVA with residual left-sided weakness/left frontal encephalomalacia Continue aspirin, Plavix, atorvastatin.   Seizure disorder Continue Keppra Seizure precaution   Memory loss Suspected underlying vascular dementia.   She had some reported confusion/delirium prior to arrival.   Baseline level of mentation is unclear CT head significant for chronic volume loss and small vessel ischemic changes consistent with suspected vascular dementia Outpatient follow-up   Constipation Continue Senokot-S and MiraLAX  DVT prophylaxis: heparin Code Status: DNR Family Communication: None today Disposition:   Status is: Inpatient  Remains inpatient appropriate because:Inpatient level of care appropriate due to severity of illness  Dispo: The patient is from: Home              Anticipated d/c is to: SNF on 7/6              Patient currently is not medically stable to d/c.   Difficult to place patient No  Consultants:  Renal  Neurology Vascular   Procedures:  6/17 PROCEDURE: Ultrasound guidance for vascular access to the right internal jugular  vein Fluoroscopic guidance for placement of catheter Placement of a 19 cm tip to cuff tunneled hemodialysis catheter via the right internal jugular  vein  6/8 Renal artery US IMPRESSION: 1. Renal artery velocities within normal limits. 2. Nonvisualization of the abdominal aorta, therefore renal artery-aorta ratios could not be measured. 3. Partially visualized cystic mass noted in the right abdomen measuring 10.8 x 5.6 cm is of uncertain etiology. Further evaluation with CT of the abdomen and pelvis should be performed to better evaluate this structure.   These results will be called to the ordering clinician or representative by the Radiologist Assistant, and communication documented in the PACS or [employer].  Antimicrobials:  Anti-infectives (From admission, onward)    Start     Dose/Rate Route Frequency Ordered Stop   01/06/21 0845  cefTRIAXone (ROCEPHIN) 1 g in sodium chloride 0.9 % 100 mL IVPB  Status:  Discontinued        1 g 200 mL/hr over 30 Minutes Intravenous Every 24 hours 01/06/21 0751 01/10/21 1557   12/31/20 0900  ceFAZolin (ANCEF) powder 1 g  Status:  Discontinued        1 g Other To Surgery 12/31/20 0801 12/31/20 0808   12/31/20 0752  sodium chloride 0.9 % with ceFAZolin (ANCEF) ADS Med       Note to Pharmacy: Corlis Hove   : cabinet override      12/31/20 0752 12/31/20 1959   12/31/20 0749  ceFAZolin (ANCEF) IVPB 2g/100 mL premix  Status:  Discontinued        2 g 200 mL/hr over 30 Minutes Intravenous 30 min pre-op 12/31/20 0749 12/31/20 1003   12/15/20 0800  cefTRIAXone (ROCEPHIN) 1 g in sodium chloride 0.9 % 100 mL IVPB        1 g 200 mL/hr over 30 Minutes Intravenous Every 24 hours 12/15/20 0713 12/17/20 1034       Subjective: Feels well, no complaints.  Seen at dialysis  Objective: Vitals:   01/18/21 1200 01/18/21 1215 01/18/21 1230 01/18/21 1457  BP: (!) 164/54 (!) 159/64 (!) 160/56 (!) 163/47  Pulse:    75  Resp: 18 20 16 18   Temp:    98.1 F (36.7 C)  TempSrc:      SpO2:    98%  Weight:      Height:        Intake/Output Summary (Last 24 hours) at 01/18/2021 1458 Last data  filed at 01/17/2021 2005 Gross per 24 hour  Intake 240 ml  Output 500 ml  Net -260 ml   Filed Weights   01/16/21 0544 01/17/21 0500 01/18/21 0500  Weight: 89.7 kg 90.7 kg 91 kg    Examination:  General: No acute distress. Cardiovascular: RRR Lungs:  unlabored Abdomen: Soft, nontender, nondistended Neurological: Alert and oriented 3. Moves all extremities 4 . Cranial nerves II through XII grossly intact. Skin: Warm and dry. No rashes or lesions. Extremities: No clubbing or cyanosis. No edema.    Data Reviewed: I have personally reviewed following labs and imaging studies  CBC: Recent Labs  Lab 01/14/21 0507 01/15/21 0355 01/16/21 0357 01/17/21 0430 01/18/21 0414  WBC 7.4 6.4 6.5 7.6 7.7  NEUTROABS 4.2 3.6 4.2 4.5 4.5  HGB 8.4* 8.1* 8.8* 8.5* 8.9*  HCT 25.9* 25.1* 27.1* 25.3* 26.1*  MCV 91.5 93.7 91.6 91.0 89.4  PLT 221 216 252 235 573    Basic Metabolic Panel: Recent Labs  Lab 01/14/21 0507 01/15/21 0355 01/16/21 0008 01/16/21 0357 01/17/21  0430 01/18/21 0414  NA 136 136  --  133* 135 135  K 3.8 3.6  --  3.8 3.9 4.2  CL 98 99  --  96* 98 98  CO2 30 28  --  31 28 26   GLUCOSE 100* 117*  --  180* 107* 143*  BUN 29* 45*  --  24* 45* 62*  CREATININE 2.21* 3.13*  --  2.19* 3.02* 3.64*  CALCIUM 8.5* 8.6*  --  8.5* 8.7* 8.4*  MG 2.1 2.1  --  1.9 2.0 2.2  PHOS 4.2 4.6 3.0 3.7 5.5* 5.9*    GFR: Estimated Creatinine Clearance: 16.4 mL/min (A) (by C-G formula based on SCr of 3.64 mg/dL (H)).  Liver Function Tests: Recent Labs  Lab 01/14/21 0507 01/15/21 0355 01/16/21 0357 01/17/21 0430 01/18/21 0414  AST 25 19 23 17 17   ALT 14 13 13 11 11   ALKPHOS 61 58 60 51 57  BILITOT 0.6 0.5 0.5 0.5 0.4  PROT 5.8* 5.5* 5.8* 5.6* 5.6*  ALBUMIN 2.9* 2.7* 2.8* 2.7* 2.6*    CBG: Recent Labs  Lab 01/17/21 1138 01/17/21 1635 01/17/21 2201 01/18/21 0734 01/18/21 1258  GLUCAP 289* 223* 179* 153* 163*     Recent Results (from the past 240 hour(s))  Resp Panel  by RT-PCR (Flu A&B, Covid) Nasopharyngeal Swab     Status: None   Collection Time: 01/13/21  1:32 PM   Specimen: Nasopharyngeal Swab; Nasopharyngeal(NP) swabs in vial transport medium  Result Value Ref Range Status   SARS Coronavirus 2 by RT PCR NEGATIVE NEGATIVE Final    Comment: (NOTE) SARS-CoV-2 target nucleic acids are NOT DETECTED.  The SARS-CoV-2 RNA is generally detectable in upper respiratory specimens during the acute phase of infection. The lowest concentration of SARS-CoV-2 viral copies this assay can detect is 138 copies/mL. A negative result does not preclude SARS-Cov-2 infection and should not be used as the sole basis for treatment or other patient management decisions. A negative result may occur with  improper specimen collection/handling, submission of specimen other than nasopharyngeal swab, presence of viral mutation(s) within the areas targeted by this assay, and inadequate number of viral copies(<138 copies/mL). A negative result must be combined with clinical observations, patient history, and epidemiological information. The expected result is Negative.  Fact Sheet for Patients:  EntrepreneurPulse.com.au  Fact Sheet for Healthcare Providers:  IncredibleEmployment.be  This test is no t yet approved or cleared by the [place] FDA and  has been authorized for detection and/or diagnosis of SARS-CoV-2 by FDA under an Emergency Use Authorization (EUA). This EUA will remain  in effect (meaning this test can be used) for the duration of the COVID-19 declaration under Section 564(b)(1) of the Act, 21 U.S.C.section 360bbb-3(b)(1), unless the authorization is terminated  or revoked sooner.       Influenza A by PCR NEGATIVE NEGATIVE Final   Influenza B by PCR NEGATIVE NEGATIVE Final    Comment: (NOTE) The Xpert Xpress SARS-CoV-2/FLU/RSV plus assay is intended as an aid in the diagnosis of influenza from Nasopharyngeal swab  specimens and should not be used as a sole basis for treatment. Nasal washings and aspirates are unacceptable for Xpert Xpress SARS-CoV-2/FLU/RSV testing.  Fact Sheet for Patients: EntrepreneurPulse.com.au  Fact Sheet for Healthcare Providers: IncredibleEmployment.be  This test is not yet approved or cleared by the [place] FDA and has been authorized for detection and/or diagnosis of SARS-CoV-2 by FDA under an Emergency Use Authorization (EUA). This EUA will remain in effect (meaning  this test can be used) for the duration of the COVID-19 declaration under Section 564(b)(1) of the Act, 21 U.S.C. section 360bbb-3(b)(1), unless the authorization is terminated or revoked.  Performed at [facility], [address], [place],  [address]          Radiology Studies: No results found.      Scheduled Meds:  amLODipine  10 mg Oral Daily   aspirin  81 mg Oral Daily   atorvastatin  20 mg Oral QHS   Chlorhexidine Gluconate Cloth  6 each Topical Q0600   cloNIDine  0.3 mg Oral BID   clopidogrel  75 mg Oral Daily   epoetin (EPOGEN/PROCRIT) injection  4,000 Units Intravenous Q T,Th,Sa-HD   feeding supplement (NEPRO CARB STEADY)  237 mL Oral BID BM   gabapentin  100 mg Oral QHS   heparin  5,000 Units Subcutaneous Q8H   heparin sodium (porcine)       heparin sodium (porcine)       heparin sodium (porcine)       hydrALAZINE  100 mg Oral Q6H   insulin aspart  0-15 Units Subcutaneous TID AC & HS   insulin aspart  5 Units Subcutaneous TID WC   insulin glargine  10 Units Subcutaneous QHS   isosorbide mononitrate  60 mg Oral Daily   levETIRAcetam  250 mg Oral Once   levETIRAcetam  500 mg Oral Q24H   metoprolol tartrate  50 mg Oral BID   multivitamin  1 tablet Oral QHS   nystatin  1 application Topical BID   pantoprazole  40 mg Oral Daily   polyethylene glycol  17 g Oral Daily   senna-docusate  2 tablet Oral BID   sevelamer  carbonate  800 mg Oral TID WC   torsemide  20 mg Oral Daily   vitamin B-12  250 mcg Oral Daily   Continuous Infusions:  sodium chloride     sodium chloride Stopped (01/06/21 1455)     LOS: 33 days    Time spent: over 46 min    Max Sane, MD [facility]   To contact the attending provider between 7A-7P or the covering provider during after hours 7P-7A, please log into the web site [email or web address] and access using universal [place] password for that web site. If you do not have the password, please call the hospital operator.  01/18/2021, 2:58 PM

## 2021-01-18 NOTE — Progress Notes (Signed)
Nutrition Follow-up  DOCUMENTATION CODES:   Severe malnutrition in context of acute illness/injury  INTERVENTION:   Continue Nepro Shake po BID, each supplement provides 425 kcal and 19 grams protein  Continue Magic cup TID with meals, each supplement provides 290 kcal and 9 grams of protein  Rena-vit po daily   Liberalize diet   NUTRITION DIAGNOSIS:   Severe Malnutrition related to acute illness, chronic illness, poor appetite as evidenced by energy intake < or equal to 50% for > or equal to 5 days, percent weight loss.  GOAL:   Patient will meet greater than or equal to 90% of their needs -progressing   MONITOR:   PO intake, Supplement acceptance, Labs, Weight trends, I & O's  ASSESSMENT:   65 y.o. female with medical history significant for history of CVA with residual left-sided weakness, left frontal encephalomalacia, CKD stage IIIb-IV, insulin-dependent type 2 diabetes, hypertension, hyperlipidemia, seizure disorder and memory loss (vascular dementia) who is admitted with AKI and progression to ESRD now s/p perm cath and HD initiation 6/17  RD working remotely.  Spoke with pt's husband via phone. Husband reports pt with fair appetite and oral intake in hospital. Per chart, pt is eating 50-100% of meals in hospital. Husband reports that pt does like the butter pecan Nepro but reports that she does not drink it every time it is offered. RD discussed with pt's husband the importance of adequate nutrition needed to preserve lean muscle. Encouraged husband to encourage wife to try and drink 2 supplements per day. Pt is eating the Magic Cups on her meal trays. RD will liberalize pt's diet as pt is likely not eating enough to exceed nutrient limits. Pt continues on HD. Per chart, plan is for discharge tomorrow.   Per chart, pt has remained weight stable since admit.   Medications reviewed and include: aspirin, plavix, epogen, heparin, rena-vit, insulin, protonix, miralax,  renvela, senokot, torsemide, B12  Labs reviewed: K 4.2 wnl, BUN 62(H), creat 3.64(H), P 5.9(H), Mg 2.2 wnl Hgb 8.9(L), Hct 26.1(L) cbgs- 153, 163 x 24 hrs  Diet Order:   Diet Order             Diet renal with fluid restriction Fluid restriction: 1200 mL Fluid; Room service appropriate? Yes; Fluid consistency: Thin  Diet effective now                  EDUCATION NEEDS:   Education needs have been addressed  Skin:  Skin Assessment: Reviewed RN Assessment  Last BM:  7/1- type 5  Height:   Ht Readings from Last 1 Encounters:  12/24/20 5\' 2"  (1.575 m)    Weight:   Wt Readings from Last 1 Encounters:  01/18/21 91 kg    Ideal Body Weight:  50 kg  BMI:  Body mass index is 36.69 kg/m.  Estimated Nutritional Needs:   Kcal:  1900-2200kcal/day  Protein:  95-110g/day  Fluid:  UOP +1L  Koleen Distance MS, RD, LDN Please refer to [facility] for RD and/or RD on-call/weekend/after hours pager

## 2021-01-18 NOTE — Progress Notes (Signed)
OT Cancellation Note  Patient Details Name: Raja Caputi MRN: [id number] DOB: 01-28-1956   Cancelled Treatment:    Reason Eval/Treat Not Completed: Patient at procedure or test/ unavailable. Pt off unit for HD at present. Will attempt OT at a later time/date, as pt is available.   Josiah Lobo, PhD, MS, OTR/L 01/18/21, 1:55 PM

## 2021-01-18 NOTE — Progress Notes (Signed)
PT Cancellation Note  Patient Details Name: Susan House MRN: [id number] DOB: 1955/09/30   Cancelled Treatment:    Reason Eval/Treat Not Completed: Patient at procedure or test/unavailable (Chart reviewed for planned treatment session. Patient currently off unit for dialysis.  Will contine efforts at later time/date as medically appropriate and available.)   [name], PT, DPT, NCS 01/18/21, 10:24 AM [phone]

## 2021-01-18 NOTE — TOC Progression Note (Addendum)
Transition of Care [facility]) - Progression Note    Patient Details  Name: Susan House MRN: [id number] Date of Birth: 28-May-1956  Transition of Care [facility] CM/SW [place], LCSW Phone Number: 01/18/2021, 9:55 AM  Clinical Narrative:   Notified [place] admissions coordinator that patient will need to bring a hoyer pad with her to HD. Started insurance authorization through [employer] portal. Left daughter a Advertising account executive.  10:10 am: Received call back from daughter. Confirmed plan to return to [employer]. Authorization approved but authorization number is pending.   Expected Discharge Plan: [place] Barriers to Discharge: Continued Medical Work up  Expected Discharge Plan and Services Expected Discharge Plan: [place] Acute Care Choice: Resumption of Svcs/PTA Provider Living arrangements for the past 2 months: Single Family Home                                       Social Determinants of Health (SDOH) Interventions    Readmission Risk Interventions No flowsheet data found.

## 2021-01-18 NOTE — Progress Notes (Signed)
Spoke with Dewayne in central telemetry at 0905 to switch patient from room 203 to [facility].

## 2021-01-18 NOTE — Progress Notes (Signed)
Pt is alert with no complaints, goal has been reached and tx completed.

## 2021-01-19 DIAGNOSIS — N186 End stage renal disease: Secondary | ICD-10-CM

## 2021-01-19 DIAGNOSIS — Z8673 Personal history of transient ischemic attack (TIA), and cerebral infarction without residual deficits: Secondary | ICD-10-CM | POA: Diagnosis not present

## 2021-01-19 DIAGNOSIS — Z743 Need for continuous supervision: Secondary | ICD-10-CM | POA: Diagnosis not present

## 2021-01-19 DIAGNOSIS — U071 COVID-19: Secondary | ICD-10-CM | POA: Diagnosis not present

## 2021-01-19 DIAGNOSIS — N171 Acute kidney failure with acute cortical necrosis: Secondary | ICD-10-CM | POA: Diagnosis not present

## 2021-01-19 DIAGNOSIS — D631 Anemia in chronic kidney disease: Secondary | ICD-10-CM | POA: Diagnosis not present

## 2021-01-19 DIAGNOSIS — E1159 Type 2 diabetes mellitus with other circulatory complications: Secondary | ICD-10-CM | POA: Diagnosis not present

## 2021-01-19 DIAGNOSIS — M2550 Pain in unspecified joint: Secondary | ICD-10-CM | POA: Diagnosis not present

## 2021-01-19 DIAGNOSIS — E43 Unspecified severe protein-calorie malnutrition: Secondary | ICD-10-CM

## 2021-01-19 DIAGNOSIS — E1140 Type 2 diabetes mellitus with diabetic neuropathy, unspecified: Secondary | ICD-10-CM | POA: Diagnosis not present

## 2021-01-19 DIAGNOSIS — I2510 Atherosclerotic heart disease of native coronary artery without angina pectoris: Secondary | ICD-10-CM | POA: Diagnosis not present

## 2021-01-19 DIAGNOSIS — R531 Weakness: Secondary | ICD-10-CM | POA: Diagnosis not present

## 2021-01-19 DIAGNOSIS — I10 Essential (primary) hypertension: Secondary | ICD-10-CM | POA: Diagnosis not present

## 2021-01-19 DIAGNOSIS — I69354 Hemiplegia and hemiparesis following cerebral infarction affecting left non-dominant side: Secondary | ICD-10-CM | POA: Diagnosis not present

## 2021-01-19 DIAGNOSIS — E7800 Pure hypercholesterolemia, unspecified: Secondary | ICD-10-CM | POA: Diagnosis not present

## 2021-01-19 DIAGNOSIS — E1169 Type 2 diabetes mellitus with other specified complication: Secondary | ICD-10-CM | POA: Diagnosis not present

## 2021-01-19 DIAGNOSIS — I120 Hypertensive chronic kidney disease with stage 5 chronic kidney disease or end stage renal disease: Secondary | ICD-10-CM | POA: Diagnosis not present

## 2021-01-19 DIAGNOSIS — N2581 Secondary hyperparathyroidism of renal origin: Secondary | ICD-10-CM | POA: Diagnosis not present

## 2021-01-19 DIAGNOSIS — L03818 Cellulitis of other sites: Secondary | ICD-10-CM | POA: Diagnosis not present

## 2021-01-19 DIAGNOSIS — F32A Depression, unspecified: Secondary | ICD-10-CM | POA: Diagnosis not present

## 2021-01-19 DIAGNOSIS — E538 Deficiency of other specified B group vitamins: Secondary | ICD-10-CM | POA: Diagnosis not present

## 2021-01-19 DIAGNOSIS — K219 Gastro-esophageal reflux disease without esophagitis: Secondary | ICD-10-CM | POA: Diagnosis not present

## 2021-01-19 DIAGNOSIS — E1122 Type 2 diabetes mellitus with diabetic chronic kidney disease: Secondary | ICD-10-CM | POA: Diagnosis not present

## 2021-01-19 DIAGNOSIS — Z992 Dependence on renal dialysis: Secondary | ICD-10-CM | POA: Diagnosis not present

## 2021-01-19 DIAGNOSIS — K922 Gastrointestinal hemorrhage, unspecified: Secondary | ICD-10-CM | POA: Diagnosis not present

## 2021-01-19 DIAGNOSIS — R1312 Dysphagia, oropharyngeal phase: Secondary | ICD-10-CM | POA: Diagnosis not present

## 2021-01-19 DIAGNOSIS — Z794 Long term (current) use of insulin: Secondary | ICD-10-CM | POA: Diagnosis not present

## 2021-01-19 DIAGNOSIS — M1990 Unspecified osteoarthritis, unspecified site: Secondary | ICD-10-CM | POA: Diagnosis not present

## 2021-01-19 DIAGNOSIS — J309 Allergic rhinitis, unspecified: Secondary | ICD-10-CM | POA: Diagnosis not present

## 2021-01-19 DIAGNOSIS — N189 Chronic kidney disease, unspecified: Secondary | ICD-10-CM | POA: Diagnosis not present

## 2021-01-19 DIAGNOSIS — I152 Hypertension secondary to endocrine disorders: Secondary | ICD-10-CM

## 2021-01-19 DIAGNOSIS — E785 Hyperlipidemia, unspecified: Secondary | ICD-10-CM | POA: Diagnosis not present

## 2021-01-19 DIAGNOSIS — N183 Chronic kidney disease, stage 3 unspecified: Secondary | ICD-10-CM | POA: Diagnosis not present

## 2021-01-19 DIAGNOSIS — G9340 Encephalopathy, unspecified: Secondary | ICD-10-CM | POA: Diagnosis not present

## 2021-01-19 DIAGNOSIS — Z7401 Bed confinement status: Secondary | ICD-10-CM | POA: Diagnosis not present

## 2021-01-19 DIAGNOSIS — I639 Cerebral infarction, unspecified: Secondary | ICD-10-CM | POA: Diagnosis not present

## 2021-01-19 DIAGNOSIS — R6889 Other general symptoms and signs: Secondary | ICD-10-CM | POA: Diagnosis not present

## 2021-01-19 DIAGNOSIS — I69998 Other sequelae following unspecified cerebrovascular disease: Secondary | ICD-10-CM | POA: Diagnosis not present

## 2021-01-19 DIAGNOSIS — M6281 Muscle weakness (generalized): Secondary | ICD-10-CM | POA: Diagnosis not present

## 2021-01-19 DIAGNOSIS — K589 Irritable bowel syndrome without diarrhea: Secondary | ICD-10-CM | POA: Diagnosis not present

## 2021-01-19 DIAGNOSIS — I69398 Other sequelae of cerebral infarction: Secondary | ICD-10-CM | POA: Diagnosis not present

## 2021-01-19 DIAGNOSIS — G459 Transient cerebral ischemic attack, unspecified: Secondary | ICD-10-CM | POA: Diagnosis not present

## 2021-01-19 LAB — GLUCOSE, CAPILLARY
Glucose-Capillary: 153 mg/dL — ABNORMAL HIGH (ref 70–99)
Glucose-Capillary: 256 mg/dL — ABNORMAL HIGH (ref 70–99)

## 2021-01-19 LAB — SARS CORONAVIRUS 2 (TAT 6-24 HRS): SARS Coronavirus 2: NEGATIVE

## 2021-01-19 MED ORDER — TORSEMIDE 20 MG PO TABS: 20.0000 mg | ORAL_TABLET | Freq: Every day | ORAL | 0 refills | Status: AC

## 2021-01-19 MED ORDER — LEVETIRACETAM 500 MG PO TABS: 500.0000 mg | ORAL_TABLET | ORAL | 0 refills | Status: AC

## 2021-01-19 MED ORDER — GABAPENTIN 100 MG PO CAPS: 100.0000 mg | ORAL_CAPSULE | ORAL | 0 refills | Status: AC

## 2021-01-19 MED ORDER — ISOSORBIDE MONONITRATE ER 60 MG PO TB24: 60.0000 mg | ORAL_TABLET | Freq: Every day | ORAL | 0 refills | Status: AC

## 2021-01-19 MED ORDER — INSULIN GLARGINE 100 UNIT/ML ~~LOC~~ SOLN: 10.0000 [IU] | Freq: Every day | SUBCUTANEOUS | 11 refills | Status: DC

## 2021-01-19 MED ORDER — INSULIN ASPART 100 UNIT/ML IJ SOLN: 5.0000 [IU] | Freq: Three times a day (TID) | INTRAMUSCULAR | 1 refills | Status: DC

## 2021-01-19 MED ORDER — HYDRALAZINE HCL 50 MG PO TABS: 100.0000 mg | ORAL_TABLET | Freq: Three times a day (TID) | ORAL | Status: AC

## 2021-01-19 NOTE — Progress Notes (Signed)
Patient discharged to facility via EMS. Report previously called to  Bud Face at [facility].  Patient discharged with all pertinent information, prescriptions and personal belongings.  IV site d/ced.  No acute distress noted. Care relinquished.

## 2021-01-19 NOTE — TOC Progression Note (Signed)
Transition of Care [facility]) - Progression Note    Patient Details  Name: Susan House MRN: [id number] Date of Birth: 18-Feb-1956  Transition of Care [facility]) CM/SW [place], LCSW Phone Number: 01/19/2021, 9:32 AM  Clinical Narrative:   HD coordinator confirmed patient will go to [facility] on [address] in [place]. There is some question about whether the center will allow facility to send their hoyer pad so admissions coordinator will call them to confirm.  Expected Discharge Plan: [place] Barriers to Discharge: Continued Medical Work up  Expected Discharge Plan and Services Expected Discharge Plan: [place] Acute Care Choice: Resumption of Svcs/PTA Provider Living arrangements for the past 2 months: Single Family Home                                       Social Determinants of Health (SDOH) Interventions    Readmission Risk Interventions No flowsheet data found.

## 2021-01-19 NOTE — Discharge Summary (Addendum)
[name]    MR#:  [id number]  DATE OF BIRTH:  03-03-1956  DATE OF ADMISSION:  12/14/2020   ADMITTING PHYSICIAN: Sidney Ace, MD  DATE OF DISCHARGE: 01/19/2021  PRIMARY CARE PHYSICIAN: Jerrol Banana., MD   ADMISSION DIAGNOSIS:  AKI (acute kidney injury) ([place]) [N17.9] Altered mental status, unspecified altered mental status type [R41.82] Acute kidney injury superimposed on chronic kidney disease ([place]) [N17.9, N18.9] DISCHARGE DIAGNOSIS:  Principal Problem:   Acute kidney injury superimposed on chronic kidney disease ([place]) Active Problems:   Hypertension associated with diabetes ([place])   Type 2 diabetes mellitus with complications ([place])   Hemiparesis due to old cerebrovascular accident ([place])   Hyperlipidemia associated with type 2 diabetes mellitus ([place])   Hx of completed stroke   AKI (acute kidney injury) ([place])   Protein-calorie malnutrition, severe  SECONDARY DIAGNOSIS:   Past Medical History:  Diagnosis Date   Allergy    Arthritis    Atypical chest pain    a. Reportedly nl cath in the past; b. 12/2009 MV: No ischemia/infarct. EF 87%.   Carotid arterial disease ([place])    a. 09/2200 CTA head/neck: LICA 54Y, RICA 70-->[facility] rx.   Cellulitis and abscess of face    Chronic Dizziness    Coronary artery calcification seen on CT scan    a. 10/2016 CTA chest: Atherosclerotic calcifications Ao, cors, and prox great vessels.   Depression    Diabetes mellitus ([place])    Diastolic dysfunction    a. 2008 Echo: Nl EF; b. 2011 Echo: Nl EF; c. 06/2016 Echo: EF 55-60%, Gr1DD; d. 03/2018 Echo: EF 55-60%, no rwma, Gr1DD, mod dil LA.   Edema    GERD (gastroesophageal reflux disease)    Headache    Hematuria    Hyperlipidemia    Hypertension    IBS (irritable bowel syndrome)    Migraine    Morbid obesity ([place])    Nocturia    Possible Seizure ([place])    a. 10/2016 ? sz. MRI neg for stroke.   Stroke [facility]) 2000   TIA  (transient ischemic attack) 2010 & 2019   a. 2010; b. 03/2018 Left sided wkns-->MRI neg for stroke. CTA head/neck: Right A2 cut off/occlusion w/ collats extending over R cerebral convexity. LICA 76E, RICA 45, RSubclavian 50->med rx.   Urgency of micturation    Urinary frequency    Urinary incontinence    HOSPITAL COURSE:  65 y.o. female with medical history significant for history of CVA with residual left-sided weakness, left frontal encephalomalacia, CKD stage IIIb-IV, insulin-dependent type 2 diabetes, hypertension, hyperlipidemia, seizure disorder, and memory loss admitted for evaluation of generalized weakness and abnormal labs, found to have AKI on CKD and evidence of urinary tract infection.  Due to worsening kidney function, nephrology started HD on 12/31/2020.     End-stage kidney disease Started on HD on 6/17 Renal ultrasound with Doppler rule showed 10.8 x 5.6 cm cystic mass on the right side.  CT abdomen pelvis did not show this mass Patient has outpatient HD set up already for T/TH/S   Acute metabolic encephalopathy Improved Likely due to infection and uremia Completed course of Rocephin for UTI Procalcitonin 1.02-->1.44 UA with large leukocytes, many bacteria, > 50 WBC UC with multiple species BC X 2 NGTD CXR with atelectasis, otherwise unremarkable Code stroke was activated, CT head negative for any acute intracranial abnormality Neurology has no further recs - suspected encephalopathy due  to infection   Hypokalemia Replaced   Refractory/labile hypertension  Continue current regimen of amlodipine, clonidine, hydralazine, metoprolol, imdur, torsemide   Anemia of chronic kidney disease and B12 deficiency Hemoglobin stable s/p 1 PRBC Continue B12 supplementation   Insulin-dependent type 2 diabetes, uncontrolled A1c 8.6%, resume home insulin regimen   History of CVA with residual left-sided weakness/left frontal encephalomalacia Continue aspirin, Plavix,  atorvastatin.   Seizure disorder Continue Keppra Seizure precaution   Memory loss Suspected underlying vascular dementia.   She had some reported confusion/delirium prior to arrival.   Close to her baseline per family CT head significant for chronic volume loss and small vessel ischemic changes consistent with suspected vascular dementia Outpatient neuro follow-up as need   Constipation resolved   DISCHARGE CONDITIONS:  stable CONSULTS OBTAINED:  Treatment Team:  Murlean Iba, MD DRUG ALLERGIES:   Allergies  Allergen Reactions   Morphine And Related Anaphylaxis   Other     Green peas   Reglan [Metoclopramide]     Other reaction(s): Unknown Elevated BP   Simvastatin Other (See Comments)    Muscle pain   Betadine [Povidone Iodine] Rash   Tetracyclines & Related Rash    Other reaction(s): Unknown   DISCHARGE MEDICATIONS:   Allergies as of 01/19/2021       Reactions   Morphine And Related Anaphylaxis   Other    Green peas   Reglan [metoclopramide]    Other reaction(s): Unknown Elevated BP   Simvastatin Other (See Comments)   Muscle pain   Betadine [povidone Iodine] Rash   Tetracyclines & Related Rash   Other reaction(s): Unknown        Medication List     STOP taking these medications    enalapril 5 MG tablet Commonly known as: VASOTEC   famotidine 20 MG tablet Commonly known as: PEPCID       TAKE these medications    Accu-Chek Aviva Plus test strip Generic drug: glucose blood TEST BLOOD SUGAR 3 TIMES  DAILY   acetaminophen 325 MG tablet Commonly known as: TYLENOL Take 2 tablets (650 mg total) by mouth every 6 (six) hours as needed for mild pain (or Fever >/= 101).   amLODipine 10 MG tablet Commonly known as: NORVASC TAKE 1 TABLET BY MOUTH  DAILY   aspirin 81 MG chewable tablet Chew 1 tablet (81 mg total) by mouth daily.   atorvastatin 20 MG tablet Commonly known as: LIPITOR TAKE 1 TABLET BY MOUTH  DAILY   Cavilon Durable Barrier  1.3 % Crea Generic drug: Dimethicone Apply barrier cream PRN   cloNIDine 0.3 MG tablet Commonly known as: CATAPRES TAKE 1 TABLET BY MOUTH  TWICE DAILY   clopidogrel 75 MG tablet Commonly known as: PLAVIX TAKE 1 TABLET BY MOUTH  DAILY   gabapentin 100 MG capsule Commonly known as: NEURONTIN Take 1 capsule (100 mg total) by mouth every Tuesday, Thursday, and Saturday at 6 PM. (After dialysis) What changed:  how much to take when to take this additional instructions   hydrALAZINE 50 MG tablet Commonly known as: APRESOLINE Take 2 tablets (100 mg total) by mouth 3 (three) times daily. What changed: how much to take   insulin aspart 100 UNIT/ML injection Commonly known as: novoLOG Inject 5 Units into the skin 3 (three) times daily with meals. What changed: how much to take   insulin glargine 100 UNIT/ML injection Commonly known as: LANTUS Inject 0.1 mLs (10 Units total) into the skin at bedtime. What changed: how much  to take   isosorbide mononitrate 60 MG 24 hr tablet Commonly known as: IMDUR Take 1 tablet (60 mg total) by mouth daily.   levETIRAcetam 500 MG tablet Commonly known as: KEPPRA Take 1 tablet (500 mg total) by mouth daily. What changed: when to take this   metoprolol succinate 100 MG 24 hr tablet Commonly known as: TOPROL-XL TAKE 1 TABLET BY MOUTH  DAILY   nitroGLYCERIN 0.4 MG SL tablet Commonly known as: NITROSTAT Place 1 tablet (0.4 mg total) under the tongue every 5 (five) minutes as needed for chest pain.   nystatin powder Commonly known as: MYCOSTATIN/NYSTOP Apply 1 application topically 2 (two) times daily. Apply to breast folds for skin breakdown.   ondansetron 4 MG tablet Commonly known as: Zofran Take 1 tablet (4 mg total) by mouth every 8 (eight) hours as needed for nausea or vomiting.   pantoprazole 40 MG tablet Commonly known as: PROTONIX TAKE 1 TABLET BY MOUTH  DAILY   torsemide 20 MG tablet Commonly known as: DEMADEX Take 1 tablet  (20 mg total) by mouth daily.   vitamin B-12 250 MCG tablet Commonly known as: CYANOCOBALAMIN Take 1 tablet (250 mcg total) by mouth daily.       DISCHARGE INSTRUCTIONS:   DIET:  Renal diet DISCHARGE CONDITION:  Stable ACTIVITY:  Activity as tolerated OXYGEN:  Home Oxygen: No.  Oxygen Delivery: room air DISCHARGE LOCATION:  nursing home with outpt palliative  If you experience worsening of your admission symptoms, develop shortness of breath, life threatening emergency, suicidal or homicidal thoughts you must seek medical attention immediately by calling 911 or calling your MD immediately  if symptoms less severe.  You Must read complete instructions/literature along with all the possible adverse reactions/side effects for all the Medicines you take and that have been prescribed to you. Take any new Medicines after you have completely understood and accpet all the possible adverse reactions/side effects.   Please note  You were cared for by a hospitalist during your hospital stay. If you have any questions about your discharge medications or the care you received while you were in the hospital after you are discharged, you can call the unit and asked to speak with the hospitalist on call if the hospitalist that took care of you is not available. Once you are discharged, your primary care physician will handle any further medical issues. Please note that NO REFILLS for any discharge medications will be authorized once you are discharged, as it is imperative that you return to your primary care physician (or establish a relationship with a primary care physician if you do not have one) for your aftercare needs so that they can reassess your need for medications and monitor your lab values.    On the day of Discharge:  VITAL SIGNS:  Blood pressure (!) 162/53, pulse 65, temperature 98.5 F (36.9 C), temperature source Oral, resp. rate 18, height 5\' 2"  (1.575 m), weight 91 kg, SpO2 98  %. PHYSICAL EXAMINATION:  GENERAL:  65 y.o.-year-old patient lying in the bed with no acute distress.  EYES: Pupils equal, round, reactive to light and accommodation. No scleral icterus. Extraocular muscles intact.  HEENT: Head atraumatic, normocephalic. Oropharynx and nasopharynx clear.  NECK:  Supple, no jugular venous distention. No thyroid enlargement, no tenderness.  LUNGS: Normal breath sounds bilaterally, no wheezing, rales,rhonchi or crepitation. No use of accessory muscles of respiration.  CARDIOVASCULAR: S1, S2 normal. No murmurs, rubs, or gallops.  ABDOMEN: Soft, non-tender, non-distended. Bowel sounds  present. No organomegaly or mass.  EXTREMITIES: No pedal edema, cyanosis, or clubbing.  NEUROLOGIC: Cranial nerves II through XII are intact. Muscle strength 5/5 in all extremities. Sensation intact. Gait not checked.  PSYCHIATRIC: The patient is alert and oriented x 3.  SKIN: No obvious rash, lesion, or ulcer.  DATA REVIEW:   CBC Recent Labs  Lab 01/18/21 0414  WBC 7.7  HGB 8.9*  HCT 26.1*  PLT 259    Chemistries  Recent Labs  Lab 01/18/21 0414  NA 135  K 4.2  CL 98  CO2 26  GLUCOSE 143*  BUN 62*  CREATININE 3.64*  CALCIUM 8.4*  MG 2.2  AST 17  ALT 11  ALKPHOS 68  BILITOT 0.4     Outpatient follow-up  Contact information for follow-up providers     Jerrol Banana., MD. Schedule an appointment as soon as possible for a visit in 1 week(s).   Specialty: Family Medicine Why: [facility] Discharge F/UP Contact information: [address] [place] [address] [phone]         Minna Merritts, MD. Schedule an appointment as soon as possible for a visit in 2 week(s).   Specialty: Cardiology Why: [facility] Discharge F/UP Contact information: [place] [address] [phone]              Contact information for after-discharge care     [facility] .   Service: Skilled Nursing Contact information: [place] [phone]                     30 Day Unplanned Readmission Risk Score    Flowsheet Row ED to Hosp-Admission (Current) from 12/14/2020 in [facility]  30 Day Unplanned Readmission Risk Score (%) 43.91 Filed at 01/19/2021 0801       This score is the patient's risk of an unplanned readmission within 30 days of being discharged (0 -100%). The score is based on dignosis, age, lab data, medications, orders, and past utilization.   Low:  0-14.9   Medium: 15-21.9   High: 22-29.9   Extreme: 30 and above           Management plans discussed with the patient, family and they are in agreement.  CODE STATUS: DNR   TOTAL TIME TAKING CARE OF THIS PATIENT: 45 minutes.    Max Sane M.D on 01/19/2021 at 11:54 AM  [facility]   CC: Primary care physician; Jerrol Banana., MD   Note: This dictation was prepared with Dragon dictation along with smaller phrase technology. Any transcriptional errors that result from this process are unintentional.

## 2021-01-19 NOTE — Progress Notes (Signed)
[facility]  ROUNDING NOTE   Subjective:   Patient seen during dialysis treatment Alert and oriented  Appears in good spirits Denies shortness of breath and chest pain   Objective:  Vital signs in last 24 hours:  Temp:  [98.1 F (36.7 C)-99.1 F (37.3 C)] 98.5 F (36.9 C) (07/06 0739) Pulse Rate:  [60-75] 65 (07/06 0739) Resp:  [16-20] 18 (07/06 0739) BP: (126-165)/(45-65) 162/53 (07/06 0739) SpO2:  [98 %] 98 % (07/06 0739)  Weight change:  Filed Weights   01/16/21 0544 01/17/21 0500 01/18/21 0500  Weight: 89.7 kg 90.7 kg 91 kg    Intake/Output: I/O last 3 completed shifts: In: 120 [P.O.:120] Out: 350 [Urine:350]   Intake/Output this shift:  No intake/output data recorded.  Physical Exam: General: NAD  Head: Normocephalic, atraumatic.moist oral mucosal membranes  Eyes: Anicteric  Lungs:  Clear, normal effort  Heart: Regular  Abdomen:  Soft, nontender, non distended  Extremities: No peripheral edema.  Neurologic: Alert and oriented, conversant  Skin: No acute lesions or rashes  Access: RIJ permcath    Basic Metabolic Panel: Recent Labs  Lab 01/14/21 0507 01/15/21 0355 01/16/21 0008 01/16/21 0357 01/17/21 0430 01/18/21 0414  NA 136 136  --  133* 135 135  K 3.8 3.6  --  3.8 3.9 4.2  CL 98 99  --  96* 98 98  CO2 30 28  --  31 28 26   GLUCOSE 100* 117*  --  180* 107* 143*  BUN 29* 45*  --  24* 45* 62*  CREATININE 2.21* 3.13*  --  2.19* 3.02* 3.64*  CALCIUM 8.5* 8.6*  --  8.5* 8.7* 8.4*  MG 2.1 2.1  --  1.9 2.0 2.2  PHOS 4.2 4.6 3.0 3.7 5.5* 5.9*     Liver Function Tests: Recent Labs  Lab 01/14/21 0507 01/15/21 0355 01/16/21 0357 01/17/21 0430 01/18/21 0414  AST 25 19 23 17 17   ALT 14 13 13 11 11   ALKPHOS 61 58 60 51 57  BILITOT 0.6 0.5 0.5 0.5 0.4  PROT 5.8* 5.5* 5.8* 5.6* 5.6*  ALBUMIN 2.9* 2.7* 2.8* 2.7* 2.6*    No results for input(s): LIPASE, AMYLASE in the last 168 hours. No results for input(s): AMMONIA in the last  168 hours.  CBC: Recent Labs  Lab 01/14/21 0507 01/15/21 0355 01/16/21 0357 01/17/21 0430 01/18/21 0414  WBC 7.4 6.4 6.5 7.6 7.7  NEUTROABS 4.2 3.6 4.2 4.5 4.5  HGB 8.4* 8.1* 8.8* 8.5* 8.9*  HCT 25.9* 25.1* 27.1* 25.3* 26.1*  MCV 91.5 93.7 91.6 91.0 89.4  PLT 221 216 252 235 259     Cardiac Enzymes: No results for input(s): CKTOTAL, CKMB, CKMBINDEX, TROPONINI in the last 168 hours.  BNP: Invalid input(s): POCBNP  CBG: Recent Labs  Lab 01/18/21 0734 01/18/21 1258 01/18/21 1617 01/18/21 2128 01/19/21 0740  GLUCAP 153* 163* 174* 195* 153*     Microbiology: Results for orders placed or performed during the hospital encounter of 12/14/20  SARS CORONAVIRUS 2 (TAT 6-24 HRS) Nasopharyngeal Nasopharyngeal Swab     Status: None   Collection Time: 12/14/20  8:28 PM   Specimen: Nasopharyngeal Swab  Result Value Ref Range Status   SARS Coronavirus 2 NEGATIVE NEGATIVE Final    Comment: (NOTE) SARS-CoV-2 target nucleic acids are NOT DETECTED.  The SARS-CoV-2 RNA is generally detectable in upper and lower respiratory specimens during the acute phase of infection. Negative results do not preclude SARS-CoV-2 infection, do not rule out co-infections with  other pathogens, and should not be used as the sole basis for treatment or other patient management decisions. Negative results must be combined with clinical observations, patient history, and epidemiological information. The expected result is Negative.  Fact Sheet for Patients: SugarRoll.be  Fact Sheet for Healthcare Providers: https://www.woods-mathews.com/  This test is not yet approved or cleared by the [place] FDA and  has been authorized for detection and/or diagnosis of SARS-CoV-2 by FDA under an Emergency Use Authorization (EUA). This EUA will remain  in effect (meaning this test can be used) for the duration of the COVID-19 declaration under Se ction 564(b)(1) of  the Act, 21 U.S.C. section 360bbb-3(b)(1), unless the authorization is terminated or revoked sooner.  Performed at [facility], [place] [address] [place], [place] [address]   SARS CORONAVIRUS 2 (TAT 6-24 HRS) Nasopharyngeal Nasopharyngeal Swab     Status: None   Collection Time: 12/19/20  4:40 PM   Specimen: Nasopharyngeal Swab  Result Value Ref Range Status   SARS Coronavirus 2 NEGATIVE NEGATIVE Final    Comment: (NOTE) SARS-CoV-2 target nucleic acids are NOT DETECTED.  The SARS-CoV-2 RNA is generally detectable in upper and lower respiratory specimens during the acute phase of infection. Negative results do not preclude SARS-CoV-2 infection, do not rule out co-infections with other pathogens, and should not be used as the sole basis for treatment or other patient management decisions. Negative results must be combined with clinical observations, patient history, and epidemiological information. The expected result is Negative.  Fact Sheet for Patients: SugarRoll.be  Fact Sheet for Healthcare Providers: https://www.woods-mathews.com/  This test is not yet approved or cleared by the [place] FDA and  has been authorized for detection and/or diagnosis of SARS-CoV-2 by FDA under an Emergency Use Authorization (EUA). This EUA will remain  in effect (meaning this test can be used) for the duration of the COVID-19 declaration under Se ction 564(b)(1) of the Act, 21 U.S.C. section 360bbb-3(b)(1), unless the authorization is terminated or revoked sooner.  Performed at [facility], [place] [address] [place], [place] [address]   Urine Culture     Status: Abnormal   Collection Time: 01/06/21  4:22 AM   Specimen: Urine, Random  Result Value Ref Range Status   Specimen Description   Final    URINE, RANDOM Performed at [facility], [address], [place] , [place] [address]    Special Requests   Final     NONE Performed at [facility], [address] [place], [place] [address]    Culture (A)  Final    70,000 COLONIES/mL MULTIPLE SPECIES PRESENT, SUGGEST RECOLLECTION   Report Status 01/07/2021 FINAL  Final  CULTURE, BLOOD (ROUTINE X 2) w Reflex to ID Panel     Status: None   Collection Time: 01/06/21  8:19 AM   Specimen: BLOOD  Result Value Ref Range Status   Specimen Description BLOOD RIGHT [facility]  Final   Special Requests   Final    BOTTLES DRAWN AEROBIC AND ANAEROBIC Blood Culture adequate volume   Culture   Final    NO GROWTH 5 DAYS Performed at [facility], [address] [place], [place] [address]    Report Status 01/11/2021 FINAL  Final  CULTURE, BLOOD (ROUTINE X 2) w Reflex to ID Panel     Status: None   Collection Time: 01/06/21  8:21 AM   Specimen: BLOOD  Result Value Ref Range Status   Specimen Description BLOOD RIGHT  HAND  Final   Special Requests   Final    BOTTLES DRAWN AEROBIC AND ANAEROBIC Blood Culture adequate volume   Culture   Final    NO GROWTH 5 DAYS Performed at [facility], [address] [place], [place] [address]    Report Status 01/11/2021 FINAL  Final  Resp Panel by RT-PCR (Flu A&B, Covid) Nasopharyngeal Swab     Status: None   Collection Time: 01/13/21  1:32 PM   Specimen: Nasopharyngeal Swab; Nasopharyngeal(NP) swabs in vial transport medium  Result Value Ref Range Status   SARS Coronavirus 2 by RT PCR NEGATIVE NEGATIVE Final    Comment: (NOTE) SARS-CoV-2 target nucleic acids are NOT DETECTED.  The SARS-CoV-2 RNA is generally detectable in upper respiratory specimens during the acute phase of infection. The lowest concentration of SARS-CoV-2 viral copies this assay can detect is 138 copies/mL. A negative result does not preclude SARS-Cov-2 infection and should not be used as the sole basis for treatment or other patient management decisions. A negative result may occur with  improper specimen collection/handling,  submission of specimen other than nasopharyngeal swab, presence of viral mutation(s) within the areas targeted by this assay, and inadequate number of viral copies(<138 copies/mL). A negative result must be combined with clinical observations, patient history, and epidemiological information. The expected result is Negative.  Fact Sheet for Patients:  EntrepreneurPulse.com.au  Fact Sheet for Healthcare Providers:  IncredibleEmployment.be  This test is no t yet approved or cleared by the [place] FDA and  has been authorized for detection and/or diagnosis of SARS-CoV-2 by FDA under an Emergency Use Authorization (EUA). This EUA will remain  in effect (meaning this test can be used) for the duration of the COVID-19 declaration under Section 564(b)(1) of the Act, 21 U.S.C.section 360bbb-3(b)(1), unless the authorization is terminated  or revoked sooner.       Influenza A by PCR NEGATIVE NEGATIVE Final   Influenza B by PCR NEGATIVE NEGATIVE Final    Comment: (NOTE) The Xpert Xpress SARS-CoV-2/FLU/RSV plus assay is intended as an aid in the diagnosis of influenza from Nasopharyngeal swab specimens and should not be used as a sole basis for treatment. Nasal washings and aspirates are unacceptable for Xpert Xpress SARS-CoV-2/FLU/RSV testing.  Fact Sheet for Patients: EntrepreneurPulse.com.au  Fact Sheet for Healthcare Providers: IncredibleEmployment.be  This test is not yet approved or cleared by the [place] FDA and has been authorized for detection and/or diagnosis of SARS-CoV-2 by FDA under an Emergency Use Authorization (EUA). This EUA will remain in effect (meaning this test can be used) for the duration of the COVID-19 declaration under Section 564(b)(1) of the Act, 21 U.S.C. section 360bbb-3(b)(1), unless the authorization is terminated or revoked.  Performed at [facility], [place], [place] [address]   SARS CORONAVIRUS 2 (TAT 6-24 HRS) Nasopharyngeal Nasopharyngeal Swab     Status: None   Collection Time: 01/18/21  4:36 PM   Specimen: Nasopharyngeal Swab  Result Value Ref Range Status   SARS Coronavirus 2 NEGATIVE NEGATIVE Final    Comment: (NOTE) SARS-CoV-2 target nucleic acids are NOT DETECTED.  The SARS-CoV-2 RNA is generally detectable in upper and lower respiratory specimens during the acute phase of infection. Negative results do not preclude SARS-CoV-2 infection, do not rule out co-infections with other pathogens, and should not be used as the sole basis for treatment or other patient management decisions. Negative results must be combined with clinical observations, patient history, and epidemiological information.  The expected result is Negative.  Fact Sheet for Patients: SugarRoll.be  Fact Sheet for Healthcare Providers: https://www.woods-mathews.com/  This test is not yet approved or cleared by the [place] FDA and  has been authorized for detection and/or diagnosis of SARS-CoV-2 by FDA under an Emergency Use Authorization (EUA). This EUA will remain  in effect (meaning this test can be used) for the duration of the COVID-19 declaration under Se ction 564(b)(1) of the Act, 21 U.S.C. section 360bbb-3(b)(1), unless the authorization is terminated or revoked sooner.  Performed at [facility], [place] [address], [place], [place] [address]     Coagulation Studies: No results for input(s): LABPROT, INR in the last 72 hours.  Urinalysis: No results for input(s): COLORURINE, LABSPEC, PHURINE, GLUCOSEU, HGBUR, BILIRUBINUR, KETONESUR, PROTEINUR, UROBILINOGEN, NITRITE, LEUKOCYTESUR in the last 72 hours.  Invalid input(s): APPERANCEUR     Imaging: No results found.   Medications:    sodium chloride     sodium chloride Stopped (01/06/21 1455)    amLODipine  10 mg Oral  Daily   aspirin  81 mg Oral Daily   atorvastatin  20 mg Oral QHS   Chlorhexidine Gluconate Cloth  6 each Topical Q0600   cloNIDine  0.3 mg Oral BID   clopidogrel  75 mg Oral Daily   epoetin (EPOGEN/PROCRIT) injection  4,000 Units Intravenous Q T,Th,Sa-HD   feeding supplement (NEPRO CARB STEADY)  237 mL Oral BID BM   gabapentin  100 mg Oral QHS   heparin  5,000 Units Subcutaneous Q8H   hydrALAZINE  100 mg Oral Q6H   insulin aspart  0-15 Units Subcutaneous TID AC & HS   insulin aspart  5 Units Subcutaneous TID WC   insulin glargine  10 Units Subcutaneous QHS   isosorbide mononitrate  60 mg Oral Daily   levETIRAcetam  500 mg Oral Q24H   metoprolol tartrate  50 mg Oral BID   multivitamin  1 tablet Oral QHS   nystatin  1 application Topical BID   pantoprazole  40 mg Oral Daily   polyethylene glycol  17 g Oral Daily   senna-docusate  2 tablet Oral BID   sevelamer carbonate  800 mg Oral TID WC   torsemide  20 mg Oral Daily   vitamin B-12  250 mcg Oral Daily   sodium chloride, sodium chloride, acetaminophen **OR** acetaminophen, alteplase, heparin, hydrALAZINE, HYDROcodone-acetaminophen, hydrocortisone, iohexol, lidocaine (PF), lidocaine-prilocaine, ondansetron **OR** ondansetron (ZOFRAN) IV, pentafluoroprop-tetrafluoroeth, senna-docusate  Assessment/ Plan:  [name] is a 65 y.o. white female with diabetes mellitus type II, hypertension, CVA with residual left hemiplegia, hyperlipidemia, congestive heart failure who was admitted to [facility] on 12/14/2020 for AKI (acute kidney injury) ([place]) [N17.9] Altered mental status, unspecified altered mental status type [R41.82] Acute kidney injury superimposed on chronic kidney disease ([place]) [O16.0, V37.1]  Complicated and prolonged hospitalization. 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  Recent acute kidney injury secondary to ATN, prerenal azotemia and uncontrolled  hypertension. Inconsistent recovery and results of 24 hours urine creatinine clearance indicate ESRD Initiated on dialysis on 12/31/20 -Received dialysis yesterday in chair, tolerated well - Next treatment scheduled for Thursday  Outpatient treatment arranged for [facility]. Able to start Thursday. New start status will prevent clinic from started patient with a Saturday treatment.   Lab Results  Component Value Date   CREATININE 3.64 (H) 01/18/2021   CREATININE 3.02 (H) 01/17/2021   CREATININE 2.19 (H) 01/16/2021  Intake/Output Summary (Last 24 hours) at 01/19/2021 0916 Last data filed at 01/18/2021 2035 Gross per 24 hour  Intake 120 ml  Output 350 ml  Net -230 ml    2. Hypertension: Continue amlodipine, clonidine, hydralazine, metoprolol, and torsemide. BP remains elevated at times  3. 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  Hgb A1c 8.6% on 11/29/20 Stable SSI managed by hospitalist   4. Anemia of CKD with iron deficiency.  Lab Results  Component Value Date   HGB 8.9 (L) 01/18/2021   -Hemoglobin not at goal.  Low dose EPO with treatments  5. Secondary Hyperparathyroidism:   Lab Results  Component Value Date   CALCIUM 8.4 (L) 01/18/2021   PHOS 5.9 (H) 01/18/2021    Elevated Phosphorus. Now receiving Renvela 800mg  p.o. with meals   LOS: [place] 7/6/20229:16 AM

## 2021-01-19 NOTE — TOC Transition Note (Signed)
Transition of Care [facility]) - CM/SW Discharge Note   Patient Details  Name: Susan House MRN: [id number] Date of Birth: 1955-10-09  Transition of [facility]) CM/SW Contact:  Candie Chroman, LCSW Phone Number: 01/19/2021, 12:39 PM   Clinical Narrative:   Patient has orders to discharge to [facility] today. [place] is their designated palliative and hospice provider. Referral made to [facility]. SNF admissions coordinator is aware. Adriane will call daughter to get more information. Asked MD to edit discharge summary to note that she needs to follow up with outpatient palliative. RN will call report to [phone] (Room 506). EMS transport set up for 1:30. No further concerns. CSW signing off.  Final next level of care: Skilled Nursing Facility Barriers to Discharge: Barriers Resolved   Patient Goals and CMS Choice     Choice offered to / list presented to : Patient, Spouse, Adult Children  Discharge Placement   Existing PASRR number confirmed : 01/12/21          Patient [facility] bed at: [facility] Patient to be transferred to facility by: EMS Name of family member notified: Candice Harrod Patient and family notified of of transfer: 01/19/21  Discharge Plan and Services     Post Acute Care Choice: Resumption of Svcs/PTA Provider                               Social Determinants of Health (SDOH) Interventions     Readmission Risk Interventions No flowsheet data found.

## 2021-01-20 DIAGNOSIS — N171 Acute kidney failure with acute cortical necrosis: Secondary | ICD-10-CM | POA: Diagnosis not present

## 2021-01-20 DIAGNOSIS — G9340 Encephalopathy, unspecified: Secondary | ICD-10-CM | POA: Diagnosis not present

## 2021-01-20 DIAGNOSIS — E1122 Type 2 diabetes mellitus with diabetic chronic kidney disease: Secondary | ICD-10-CM | POA: Diagnosis not present

## 2021-01-20 DIAGNOSIS — G459 Transient cerebral ischemic attack, unspecified: Secondary | ICD-10-CM | POA: Diagnosis not present

## 2021-01-20 DIAGNOSIS — R531 Weakness: Secondary | ICD-10-CM | POA: Diagnosis not present

## 2021-01-20 DIAGNOSIS — Z992 Dependence on renal dialysis: Secondary | ICD-10-CM | POA: Diagnosis not present

## 2021-01-20 DIAGNOSIS — Z8673 Personal history of transient ischemic attack (TIA), and cerebral infarction without residual deficits: Secondary | ICD-10-CM | POA: Diagnosis not present

## 2021-01-20 DIAGNOSIS — N183 Chronic kidney disease, stage 3 unspecified: Secondary | ICD-10-CM | POA: Diagnosis not present

## 2021-01-20 DIAGNOSIS — N186 End stage renal disease: Secondary | ICD-10-CM | POA: Diagnosis not present

## 2021-01-22 DIAGNOSIS — Z992 Dependence on renal dialysis: Secondary | ICD-10-CM | POA: Diagnosis not present

## 2021-01-22 DIAGNOSIS — N186 End stage renal disease: Secondary | ICD-10-CM | POA: Diagnosis not present

## 2021-01-25 DIAGNOSIS — N2581 Secondary hyperparathyroidism of renal origin: Secondary | ICD-10-CM | POA: Diagnosis not present

## 2021-01-25 DIAGNOSIS — N186 End stage renal disease: Secondary | ICD-10-CM | POA: Diagnosis not present

## 2021-01-25 DIAGNOSIS — M6281 Muscle weakness (generalized): Secondary | ICD-10-CM | POA: Diagnosis not present

## 2021-01-25 DIAGNOSIS — Z992 Dependence on renal dialysis: Secondary | ICD-10-CM | POA: Diagnosis not present

## 2021-01-25 DIAGNOSIS — U071 COVID-19: Secondary | ICD-10-CM | POA: Diagnosis not present

## 2021-01-27 DIAGNOSIS — N186 End stage renal disease: Secondary | ICD-10-CM | POA: Diagnosis not present

## 2021-01-27 DIAGNOSIS — Z8673 Personal history of transient ischemic attack (TIA), and cerebral infarction without residual deficits: Secondary | ICD-10-CM | POA: Diagnosis not present

## 2021-01-27 DIAGNOSIS — K922 Gastrointestinal hemorrhage, unspecified: Secondary | ICD-10-CM | POA: Diagnosis not present

## 2021-01-27 DIAGNOSIS — Z992 Dependence on renal dialysis: Secondary | ICD-10-CM | POA: Diagnosis not present

## 2021-01-28 DIAGNOSIS — I120 Hypertensive chronic kidney disease with stage 5 chronic kidney disease or end stage renal disease: Secondary | ICD-10-CM | POA: Diagnosis not present

## 2021-01-30 ENCOUNTER — Other Ambulatory Visit: Payer: Self-pay | Admitting: Family Medicine

## 2021-01-30 DIAGNOSIS — I69398 Other sequelae of cerebral infarction: Secondary | ICD-10-CM | POA: Diagnosis not present

## 2021-01-30 DIAGNOSIS — F32A Depression, unspecified: Secondary | ICD-10-CM | POA: Diagnosis not present

## 2021-01-30 DIAGNOSIS — Z992 Dependence on renal dialysis: Secondary | ICD-10-CM | POA: Diagnosis not present

## 2021-01-30 DIAGNOSIS — Z7902 Long term (current) use of antithrombotics/antiplatelets: Secondary | ICD-10-CM | POA: Diagnosis not present

## 2021-01-30 DIAGNOSIS — M6281 Muscle weakness (generalized): Secondary | ICD-10-CM | POA: Diagnosis not present

## 2021-01-30 DIAGNOSIS — E1136 Type 2 diabetes mellitus with diabetic cataract: Secondary | ICD-10-CM | POA: Diagnosis not present

## 2021-01-30 DIAGNOSIS — I2510 Atherosclerotic heart disease of native coronary artery without angina pectoris: Secondary | ICD-10-CM | POA: Diagnosis not present

## 2021-01-30 DIAGNOSIS — Z794 Long term (current) use of insulin: Secondary | ICD-10-CM | POA: Diagnosis not present

## 2021-01-30 DIAGNOSIS — I120 Hypertensive chronic kidney disease with stage 5 chronic kidney disease or end stage renal disease: Secondary | ICD-10-CM | POA: Diagnosis not present

## 2021-01-30 DIAGNOSIS — N186 End stage renal disease: Secondary | ICD-10-CM | POA: Diagnosis not present

## 2021-01-30 DIAGNOSIS — K219 Gastro-esophageal reflux disease without esophagitis: Secondary | ICD-10-CM | POA: Diagnosis not present

## 2021-01-30 DIAGNOSIS — E7800 Pure hypercholesterolemia, unspecified: Secondary | ICD-10-CM | POA: Diagnosis not present

## 2021-01-30 DIAGNOSIS — G43909 Migraine, unspecified, not intractable, without status migrainosus: Secondary | ICD-10-CM | POA: Diagnosis not present

## 2021-01-30 DIAGNOSIS — I69354 Hemiplegia and hemiparesis following cerebral infarction affecting left non-dominant side: Secondary | ICD-10-CM | POA: Diagnosis not present

## 2021-01-30 DIAGNOSIS — E43 Unspecified severe protein-calorie malnutrition: Secondary | ICD-10-CM | POA: Diagnosis not present

## 2021-01-30 DIAGNOSIS — N3281 Overactive bladder: Secondary | ICD-10-CM | POA: Diagnosis not present

## 2021-01-30 DIAGNOSIS — J45909 Unspecified asthma, uncomplicated: Secondary | ICD-10-CM | POA: Diagnosis not present

## 2021-01-30 DIAGNOSIS — G9340 Encephalopathy, unspecified: Secondary | ICD-10-CM | POA: Diagnosis not present

## 2021-01-30 DIAGNOSIS — R1312 Dysphagia, oropharyngeal phase: Secondary | ICD-10-CM | POA: Diagnosis not present

## 2021-01-30 DIAGNOSIS — E1140 Type 2 diabetes mellitus with diabetic neuropathy, unspecified: Secondary | ICD-10-CM | POA: Diagnosis not present

## 2021-01-30 DIAGNOSIS — M1990 Unspecified osteoarthritis, unspecified site: Secondary | ICD-10-CM | POA: Diagnosis not present

## 2021-01-30 DIAGNOSIS — E1122 Type 2 diabetes mellitus with diabetic chronic kidney disease: Secondary | ICD-10-CM | POA: Diagnosis not present

## 2021-01-30 DIAGNOSIS — D631 Anemia in chronic kidney disease: Secondary | ICD-10-CM | POA: Diagnosis not present

## 2021-01-30 NOTE — Telephone Encounter (Signed)
Requested medication (s) are due for refill today: yes  Requested medication (s) are on the active medication list: yes  Last refill:  07/2120 #90 1 RF  Future visit scheduled: yes  Notes to clinic:  overdue lab work   Requested Prescriptions  Pending Prescriptions Disp Refills   atorvastatin (LIPITOR) 20 MG tablet [Pharmacy Med Name: Atorvastatin Calcium 20 MG Oral Tablet] 90 tablet 3    Sig: TAKE 1 TABLET BY MOUTH  DAILY      Cardiovascular:  Antilipid - Statins Failed - 01/30/2021 11:10 AM      Failed - Total Cholesterol in normal range and within 360 days    Cholesterol  Date Value Ref Range Status  12/24/2019 105 0 - 200 mg/dL Final  01/18/2014 110 0 - 200 mg/dL Final          Failed - LDL in normal range and within 360 days    Ldl Cholesterol, Calc  Date Value Ref Range Status  01/18/2014 51 0 - 100 mg/dL Final   LDL Cholesterol  Date Value Ref Range Status  12/24/2019 41 0 - 99 mg/dL Final    Comment:           Total Cholesterol/HDL:CHD Risk Coronary Heart Disease Risk Table                     Men   Women  1/2 Average Risk   3.4   3.3  Average Risk       5.0   4.4  2 X Average Risk   9.6   7.1  3 X Average Risk  23.4   11.0        Use the calculated Patient Ratio above and the CHD Risk Table to determine the patient's CHD Risk.        ATP III CLASSIFICATION (LDL):  <100     mg/dL   Optimal  100-129  mg/dL   Near or Above                    Optimal  130-159  mg/dL   Borderline  160-189  mg/dL   High  >190     mg/dL   Very High Performed at [facility], [address] [place], [place] [address]           Failed - HDL in normal range and within 360 days    HDL Cholesterol  Date Value Ref Range Status  01/18/2014 35 (L) 40 - 60 mg/dL Final   HDL  Date Value Ref Range Status  12/24/2019 35 (L) >40 mg/dL Final          Failed - Triglycerides in normal range and within 360 days    Triglycerides  Date Value Ref Range Status   12/24/2019 144 <150 mg/dL Final  01/18/2014 122 0 - 200 mg/dL Final          Passed - Patient is not pregnant      Passed - Valid encounter within last 12 months    Recent Outpatient Visits           11 months ago Pressure injury of skin of sacral region, unspecified injury stage   [place], [place], [place]   11 months ago Upper respiratory tract infection, unspecified type   [facility] Jerrol Banana., [place]   11 months ago Urinary tract infection without hematuria, site unspecified   [place],  Retia Passe., MD   1 year ago Sinusitis, unspecified chronicity, unspecified location   [facility] Jerrol Banana., MD   1 year ago 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)   [facility] Jerrol Banana., MD       Future Appointments             In 3 days Jerrol Banana., MD [facility], [place]   In 1 week Gollan, Kathlene November, MD [facility]

## 2021-02-02 ENCOUNTER — Telehealth (INDEPENDENT_AMBULATORY_CARE_PROVIDER_SITE_OTHER): Payer: Medicare Other | Admitting: Family Medicine

## 2021-02-02 DIAGNOSIS — Z8673 Personal history of transient ischemic attack (TIA), and cerebral infarction without residual deficits: Secondary | ICD-10-CM | POA: Diagnosis not present

## 2021-02-02 DIAGNOSIS — Z7409 Other reduced mobility: Secondary | ICD-10-CM

## 2021-02-02 DIAGNOSIS — I69359 Hemiplegia and hemiparesis following cerebral infarction affecting unspecified side: Secondary | ICD-10-CM | POA: Diagnosis not present

## 2021-02-02 DIAGNOSIS — I152 Hypertension secondary to endocrine disorders: Secondary | ICD-10-CM

## 2021-02-02 DIAGNOSIS — F4321 Adjustment disorder with depressed mood: Secondary | ICD-10-CM | POA: Diagnosis not present

## 2021-02-02 DIAGNOSIS — N179 Acute kidney failure, unspecified: Secondary | ICD-10-CM

## 2021-02-02 DIAGNOSIS — E1159 Type 2 diabetes mellitus with other circulatory complications: Secondary | ICD-10-CM | POA: Diagnosis not present

## 2021-02-02 DIAGNOSIS — N189 Chronic kidney disease, unspecified: Secondary | ICD-10-CM | POA: Diagnosis not present

## 2021-02-02 DIAGNOSIS — F320 Major depressive disorder, single episode, mild: Secondary | ICD-10-CM

## 2021-02-02 DIAGNOSIS — I63512 Cerebral infarction due to unspecified occlusion or stenosis of left middle cerebral artery: Secondary | ICD-10-CM | POA: Diagnosis not present

## 2021-02-02 MED ORDER — HYDROCODONE-ACETAMINOPHEN 5-325 MG PO TABS: 1.0000 | ORAL_TABLET | ORAL | 0 refills | Status: AC | PRN

## 2021-02-02 NOTE — Progress Notes (Addendum)
MyChart Video Visit    Virtual Visit via Video Note   This visit type was conducted due to national recommendations for restrictions regarding the COVID-19 Pandemic (e.g. social distancing) in an effort to limit this patient's exposure and mitigate transmission in our community. This patient is at least at moderate risk for complications without adequate follow up. This format is felt to be most appropriate for this patient at this time. Physical exam was limited by quality of the video and audio technology used for the visit.   Patient location: Home Provider location: office   I discussed the limitations of evaluation and management by telemedicine and the availability of in person appointments. The patient expressed understanding and agreed to proceed.  Patient: Susan House   DOB: 12/22/1955   65 y.o. Female  MRN: [id number] Visit Date: 02/02/2021  Today's healthcare provider: Wilhemena Durie, MD   No chief complaint on file.  Subjective    HPI  This is a follow-up after patient has decided to forego any further hemodialysis and be cared for at home through hospice.  She  is feeling okay but is bedbound. Daughter [name] is with her today on the visit. She is tearful at times during the visit. Follow up Hospitalization  Patient was admitted to [facility] on 12/14/20 and discharged to [facility] on 12/31/20. She was then discharged home on 01/19/21.  She was treated for acute kidney injury and altered mental status. Treatment for this included starting on isosorbide 60mg  daily.  She reports good compliance with treatment. She reports this condition is improved.       Medications: Outpatient Medications Prior to Visit  Medication Sig   ACCU-CHEK AVIVA PLUS test strip TEST BLOOD SUGAR 3 TIMES  DAILY   acetaminophen (TYLENOL) 325 MG tablet Take 2 tablets (650 mg total) by mouth every 6 (six) hours as needed for mild pain (or Fever >/= 101).   amLODipine  (NORVASC) 10 MG tablet TAKE 1 TABLET BY MOUTH  DAILY   aspirin 81 MG chewable tablet Chew 1 tablet (81 mg total) by mouth daily.   atorvastatin (LIPITOR) 20 MG tablet TAKE 1 TABLET BY MOUTH  DAILY   cloNIDine (CATAPRES) 0.3 MG tablet TAKE 1 TABLET BY MOUTH  TWICE DAILY   clopidogrel (PLAVIX) 75 MG tablet TAKE 1 TABLET BY MOUTH  DAILY   Dimethicone (CAVILON DURABLE BARRIER) 1.3 % CREA Apply barrier cream PRN   gabapentin (NEURONTIN) 100 MG capsule Take 1 capsule (100 mg total) by mouth every Tuesday, Thursday, and Saturday at 6 PM. (After dialysis)   hydrALAZINE (APRESOLINE) 50 MG tablet Take 2 tablets (100 mg total) by mouth 3 (three) times daily.   insulin aspart (NOVOLOG) 100 UNIT/ML injection Inject 5 Units into the skin 3 (three) times daily with meals.   insulin glargine (LANTUS) 100 UNIT/ML injection Inject 0.1 mLs (10 Units total) into the skin at bedtime.   isosorbide mononitrate (IMDUR) 60 MG 24 hr tablet Take 1 tablet (60 mg total) by mouth daily.   levETIRAcetam (KEPPRA) 500 MG tablet Take 1 tablet (500 mg total) by mouth daily.   metoprolol succinate (TOPROL-XL) 100 MG 24 hr tablet TAKE 1 TABLET BY MOUTH  DAILY   nitroGLYCERIN (NITROSTAT) 0.4 MG SL tablet Place 1 tablet (0.4 mg total) under the tongue every 5 (five) minutes as needed for chest pain.   nystatin (MYCOSTATIN/NYSTOP) powder Apply 1 application topically 2 (two) times daily. Apply to breast folds for skin breakdown.  ondansetron (ZOFRAN) 4 MG tablet Take 1 tablet (4 mg total) by mouth every 8 (eight) hours as needed for nausea or vomiting.   pantoprazole (PROTONIX) 40 MG tablet TAKE 1 TABLET BY MOUTH  DAILY   torsemide (DEMADEX) 20 MG tablet Take 1 tablet (20 mg total) by mouth daily.   vitamin B-12 (CYANOCOBALAMIN) 250 MCG tablet Take 1 tablet (250 mcg total) by mouth daily.   No facility-administered medications prior to visit.    Review of Systems     Objective    There were no vitals taken for this visit.     Physical Exam  She is appropriate during the visit.  Able answer questions and is breathing fairly well.   Assessment & Plan     1. Cerebrovascular accident (CVA) due to occlusion of left middle cerebral artery ([place]) This is the initial etiology of patient's medical decline.  Hemiparesis from stroke at age 41.  She is now bedbound.  2. Hypertension associated with diabetes ([place])   3. Grief reaction Patient appropriately responding to her failing health at stage 64 4. Hx of completed stroke   5. Mild major depression (HCC) Left hemiparesis  6. Poor mobility   7. Acute kidney injury superimposed on chronic kidney disease ([place]) All failure.  Palliative care is involved and hospice is probably the next appropriate step. To make housecall next month. 8. Hemiparesis due to old cerebrovascular accident [facility])    No follow-ups on file.     I discussed the assessment and treatment plan with the patient. The patient was provided an opportunity to ask questions and all were answered. The patient agreed with the plan and demonstrated an understanding of the instructions.   The patient was advised to call back or seek an in-person evaluation if the symptoms worsen or if the condition fails to improve as anticipated.  I provided 14  minutes of non-face-to-face time during this encounter.  I, Wilhemena Durie, MD, have reviewed all documentation for this visit. The documentation on 02/06/21 for the exam, diagnosis, procedures, and orders are all accurate and complete.   Umar Patmon [place] Mon, MD [facility] [phone] (phone) [phone] (fax)  [place]

## 2021-02-03 ENCOUNTER — Telehealth: Payer: Self-pay | Admitting: Family Medicine

## 2021-02-03 NOTE — Telephone Encounter (Signed)
Medication was filled on 02/02/2021.

## 2021-02-03 NOTE — Telephone Encounter (Signed)
[place] faxed refill request for the following medications:   HYDROcodone-acetaminophen ([place]) 5-325 MG tablet    Please advise.

## 2021-02-09 ENCOUNTER — Telehealth: Payer: Self-pay | Admitting: Cardiovascular Disease

## 2021-02-09 NOTE — Telephone Encounter (Unsigned)
Patient Consent for Virtual Visit   { TIP  Anything in RED or BLUE will delete when you sign your note. There is no need to delete it or select it by pressing F2.   Please read everything in BLUE to the patient.        :[id number]} { PLEASE READ THE FOLLOWING TO THE PATIENT. Then, scroll to the question in red below. If the patient has questions about consent, refer to and read the consent below,    CONSENT FOR VIRTUAL VISIT.  Susan House, you are scheduled for a virtual visit with your provider today.  Just as we do with appointments in the office, we must obtain your consent to participate.  Your consent will be active for this visit and any virtual visit you may have with one of our providers in the next 365 days.  If you have a MyChart account, I can also send a copy of this consent to you electronically.  All virtual visits are billed to your insurance company just like a traditional visit in the office.  As this is a virtual visit, video technology does not allow for your provider to perform a traditional examination.  This may limit your provider's ability to fully assess your condition.  If your provider identifies any concerns that need to be evaluated in person or the need to arrange testing such as labs, EKG, etc, we will make arrangements to do so.  Although advances in technology are sophisticated, we cannot ensure that it will always work on either your end or our end.  If the connection with a video visit is poor, we may have to switch to a telephone visit.  With either a video or telephone visit, we are not always able to ensure that we have a secure connection.   I need to obtain your verbal consent now.   Are you willing to proceed with your visit today?        :[id number]}  {  Did the patient verbally consent to the visit?  Place your cursor on the next line. Then, press F2 or Fn plus F2 to select the list below.  Then, choose YES or NO.  :[id number]}   {Click here.  Press  F2 and choose YES or NO                  :[id number]}   CONSENT FOR VIRTUAL VISIT FOR:  Susan House  By participating in this virtual visit I agree to the following:  I hereby voluntarily request, consent and authorize [facility] and its employed or contracted physicians, physician assistants, nurse practitioners or other licensed health care professionals (the Practitioner), to provide me with telemedicine health care services (the "Services") as deemed necessary by the treating Practitioner. I acknowledge and consent to receive the Services by the Practitioner via telemedicine. I understand that the telemedicine visit will involve communicating with the Practitioner through live audiovisual communication technology and the disclosure of certain medical information by electronic transmission. I acknowledge that I have been given the opportunity to request an in-person assessment or other available alternative prior to the telemedicine visit and am voluntarily participating in the telemedicine visit.  I understand that I have the right to withhold or withdraw my consent to the use of telemedicine in the course of my care at any time, without affecting my right to future care or treatment, and that the Practitioner or I may terminate the telemedicine visit at any  time. I understand that I have the right to inspect all information obtained and/or recorded in the course of the telemedicine visit and may receive copies of available information for a reasonable fee.  I understand that some of the potential risks of receiving the Services via telemedicine include:  Delay or interruption in medical evaluation due to technological equipment failure or disruption; Information transmitted may not be sufficient (e.g. poor resolution of images) to allow for appropriate medical decision making by the Practitioner; and/or  In rare instances, security protocols could fail, causing a breach of personal  health information.  Furthermore, I acknowledge that it is my responsibility to provide information about my medical history, conditions and care that is complete and accurate to the best of my ability. I acknowledge that Practitioner's advice, recommendations, and/or decision may be based on factors not within their control, such as incomplete or inaccurate data provided by me or distortions of diagnostic images or specimens that may result from electronic transmissions. I understand that the practice of medicine is not an exact science and that Practitioner makes no warranties or guarantees regarding treatment outcomes. I acknowledge that a copy of this consent can be made available to me via my patient portal ([facility]), or I can request a printed copy by calling the office of [place].    I understand that my insurance will be billed for this visit.   I have read or had this consent read to me. I understand the contents of this consent, which adequately explains the benefits and risks of the Services being provided via telemedicine.  I have been provided ample opportunity to ask questions regarding this consent and the Services and have had my questions answered to my satisfaction. I give my informed consent for the services to be provided through the use of telemedicine in my medical care

## 2021-02-11 ENCOUNTER — Other Ambulatory Visit: Payer: Self-pay

## 2021-02-11 ENCOUNTER — Encounter: Payer: Self-pay | Admitting: Cardiovascular Disease

## 2021-02-11 ENCOUNTER — Telehealth (INDEPENDENT_AMBULATORY_CARE_PROVIDER_SITE_OTHER): Payer: Medicare Other | Admitting: Cardiovascular Disease

## 2021-02-11 VITALS — BP 120/64 | HR 68 | Ht 63.0 in | Wt 200.0 lb

## 2021-02-11 DIAGNOSIS — I63512 Cerebral infarction due to unspecified occlusion or stenosis of left middle cerebral artery: Secondary | ICD-10-CM | POA: Diagnosis not present

## 2021-02-11 DIAGNOSIS — E782 Mixed hyperlipidemia: Secondary | ICD-10-CM | POA: Diagnosis not present

## 2021-02-11 DIAGNOSIS — I739 Peripheral vascular disease, unspecified: Secondary | ICD-10-CM | POA: Diagnosis not present

## 2021-02-11 DIAGNOSIS — I6523 Occlusion and stenosis of bilateral carotid arteries: Secondary | ICD-10-CM

## 2021-02-11 DIAGNOSIS — E118 Type 2 diabetes mellitus with unspecified complications: Secondary | ICD-10-CM

## 2021-02-11 DIAGNOSIS — I10 Essential (primary) hypertension: Secondary | ICD-10-CM

## 2021-02-11 DIAGNOSIS — I6389 Other cerebral infarction: Secondary | ICD-10-CM

## 2021-02-11 NOTE — Progress Notes (Signed)
Virtual Visit via Video Note   This visit type was conducted due to national recommendations for restrictions regarding the COVID-19 Pandemic (e.g. social distancing) in an effort to limit this patient's exposure and mitigate transmission in our community.  Due to her co-morbid illnesses, this patient is at least at moderate risk for complications without adequate follow up.  This format is felt to be most appropriate for this patient at this time.  All issues noted in this document were discussed and addressed.  A limited physical exam was performed with this format.  Please refer to the patient's chart for her consent to telehealth for [facility].     I connected with  Susan House on 02/11/21 by a video enabled telemedicine application and verified that I am speaking with the correct person using two identifiers. I am contacting the patient above from our cardiology clinic office or alternate office work station to their home, I discussed the limitations of evaluation and management by telemedicine. The patient expressed understanding and agreed to proceed.   Evaluation Performed:  Follow-up visit  Date:  02/11/2021   ID:  Susan House, Susan House 1956/04/10, MRN [id number]  Patient Location:  [place] [address]   Provider location:   [facility], [place] office  PCP:  Jerrol Banana., MD  Cardiologist:  [facility]  cc: Recently stopped dialysis for end-stage renal disease, on hospice at home   History of Present Illness:    Susan House is a 65 y.o. female who presents via audio/video conferencing for a telehealth visit today.   The patient does not symptoms concerning for COVID-19 infection (fever, chills, cough, or new SHORTNESS OF BREATH).   past medical history of stroke in 2000 followed by posterior circulatory TIA in June of 2010,  residual left side weakness arm and leg from her  stroke chronic dizziness in the past secondary to dehydration, Chronic chest pain restless leg syndrome, home type 2 diabetes, poorly controlled Morbid obesity PAD,  Carotid duplex showed 50-69% stenosis in the right internal carotid artery. <50% stenosis  Left CT scan chest 10/15/2016 showing diffuse three-vessel coronary disease who presents for routine follow-up of her CAD, chronic chest pain symptoms.  LOV 10/2020   Discussed recent prolonged hospitalizations She is at home, daughter and husband are with her Yesterday history provided by daughter 11/2020: high blood pressure In the hospital UTI 5 days in hospital Then [facility] 11 days, delerium Mental status changes  Back to hospital 5 weeks End-stage kidney disease Started on HD on 6/17 Renal ultrasound with Doppler rule showed 10.8 x 5.6 cm cystic mass on the right side.  CT abdomen pelvis did not show this mass Patient has outpatient HD set up already for T/TH/S  Back to [facility] Now deciding to stop HD, 7/14 round of dialysis   Now very weak, needs help with movement and all her ADLs Hospice coming in, comes daily Eats well later in day Making reasonable urine, alert, no pain, no dysuria   Past Medical History:  Diagnosis Date   Allergy    Arthritis    Atypical chest pain    a. Reportedly nl cath in the past; b. 12/2009 MV: No ischemia/infarct. EF 87%.   Carotid arterial disease ([place])    a. 0/9735 CTA head/neck: LICA 32D, RICA 92-->EQA rx.   Cellulitis and abscess of face    Chronic Dizziness  Coronary artery calcification seen on CT scan    a. 10/2016 CTA chest: Atherosclerotic calcifications Ao, cors, and prox great vessels.   Depression    Diabetes mellitus ([place])    Diastolic dysfunction    a. 2008 Echo: Nl EF; b. 2011 Echo: Nl EF; c. 06/2016 Echo: EF 55-60%, Gr1DD; d. 03/2018 Echo: EF 55-60%, no rwma, Gr1DD, mod dil LA.   Edema    GERD (gastroesophageal reflux disease)    Headache     Hematuria    Hyperlipidemia    Hypertension    IBS (irritable bowel syndrome)    Migraine    Morbid obesity ([place])    Nocturia    Possible Seizure ([place])    a. 10/2016 ? sz. MRI neg for stroke.   Stroke [facility]) 2000   TIA (transient ischemic attack) 2010 & 2019   a. 2010; b. 03/2018 Left sided wkns-->MRI neg for stroke. CTA head/neck: Right A2 cut off/occlusion w/ collats extending over R cerebral convexity. LICA 64P, RICA 45, RSubclavian 50->med rx.   Urgency of micturation    Urinary frequency    Urinary incontinence    Past Surgical History:  Procedure Laterality Date   ABDOMINAL HYSTERECTOMY     ABDOMINOPLASTY     APPENDECTOMY     BACK SURGERY     extensive spinal fusion   carpel tunnel surgery     CATARACT EXTRACTION     x 2   CESAREAN SECTION     CHOLECYSTECTOMY     COLONOSCOPY  2010   COLONOSCOPY WITH PROPOFOL N/A 07/11/2016   Procedure: COLONOSCOPY WITH PROPOFOL;  Surgeon: Lucilla Lame, MD;  Location: [facility] ENDOSCOPY;  Service: Endoscopy;  Laterality: N/A;   COLONOSCOPY WITH PROPOFOL N/A 03/26/2018   Procedure: COLONOSCOPY WITH PROPOFOL;  Surgeon: Lucilla Lame, MD;  Location: [facility] ENDOSCOPY;  Service: Endoscopy;  Laterality: N/A;   DIALYSIS/PERMA CATHETER INSERTION N/A 12/31/2020   Procedure: DIALYSIS/PERMA CATHETER INSERTION;  Surgeon: Algernon Huxley, MD;  Location: [place] CV LAB;  Service: Cardiovascular;  Laterality: N/A;   ESOPHAGOGASTRODUODENOSCOPY (EGD) WITH PROPOFOL N/A 03/26/2018   Procedure: ESOPHAGOGASTRODUODENOSCOPY (EGD) WITH PROPOFOL;  Surgeon: Lucilla Lame, MD;  Location: [facility] ENDOSCOPY;  Service: Endoscopy;  Laterality: N/A;   EYE SURGERY     HERNIA REPAIR     SHOULDER SURGERY     TEE WITHOUT CARDIOVERSION N/A 05/07/2020   Procedure: TRANSESOPHAGEAL ECHOCARDIOGRAM (TEE);  Surgeon: Nelva Bush, MD;  Location: [facility] ORS;  Service: Cardiovascular;  Laterality: N/A;   TOE SURGERY     UPPER GI ENDOSCOPY     WRIST FRACTURE SURGERY Left      Allergies:    Morphine and related, Morphine, Other, Reglan [metoclopramide], Simvastatin, Betadine [povidone iodine], and Tetracyclines & related   Social History   Tobacco Use   Smoking status: Never   Smokeless tobacco: Never  Vaping Use   Vaping Use: Never used  Substance Use Topics   Alcohol use: No   Drug use: No     Current Outpatient Medications on File Prior to Visit  Medication Sig Dispense Refill   ACCU-CHEK AVIVA PLUS test strip TEST BLOOD SUGAR 3 TIMES  DAILY 300 strip 3   acetaminophen (TYLENOL) 325 MG tablet Take 2 tablets (650 mg total) by mouth every 6 (six) hours as needed for mild pain (or Fever >/= 101).     amLODipine (NORVASC) 10 MG tablet TAKE 1 TABLET BY MOUTH  DAILY 90 tablet 3   aspirin 81 MG chewable tablet Chew 1 tablet (  81 mg total) by mouth daily.     atorvastatin (LIPITOR) 20 MG tablet TAKE 1 TABLET BY MOUTH  DAILY 90 tablet 3   cloNIDine (CATAPRES) 0.3 MG tablet TAKE 1 TABLET BY MOUTH  TWICE DAILY 180 tablet 3   clopidogrel (PLAVIX) 75 MG tablet TAKE 1 TABLET BY MOUTH  DAILY 90 tablet 3   Dimethicone (CAVILON DURABLE BARRIER) 1.3 % CREA Apply barrier cream PRN 92 g 0   gabapentin (NEURONTIN) 100 MG capsule Take 1 capsule (100 mg total) by mouth every Tuesday, Thursday, and Saturday at 6 PM. (After dialysis) 12 capsule 0   hydrALAZINE (APRESOLINE) 50 MG tablet Take 2 tablets (100 mg total) by mouth 3 (three) times daily.     HYDROcodone-acetaminophen (NORCO) 5-325 MG tablet Take 1 tablet by mouth every 4 (four) hours as needed for moderate pain. 120 tablet 0   insulin aspart (NOVOLOG) 100 UNIT/ML injection Inject 5 Units into the skin 3 (three) times daily with meals. 10 mL 1   insulin glargine (LANTUS) 100 UNIT/ML injection Inject 0.1 mLs (10 Units total) into the skin at bedtime. 10 mL 11   isosorbide mononitrate (IMDUR) 60 MG 24 hr tablet Take 1 tablet (60 mg total) by mouth daily. 30 tablet 0   levETIRAcetam (KEPPRA) 500 MG tablet Take 1 tablet (500 mg total) by  mouth daily. 30 tablet 0   metoprolol succinate (TOPROL-XL) 100 MG 24 hr tablet TAKE 1 TABLET BY MOUTH  DAILY 90 tablet 3   nitroGLYCERIN (NITROSTAT) 0.4 MG SL tablet Place 1 tablet (0.4 mg total) under the tongue every 5 (five) minutes as needed for chest pain. 25 tablet 0   nystatin (MYCOSTATIN/NYSTOP) powder Apply 1 application topically 2 (two) times daily. Apply to breast folds for skin breakdown.     ondansetron (ZOFRAN) 4 MG tablet Take 1 tablet (4 mg total) by mouth every 8 (eight) hours as needed for nausea or vomiting. 60 tablet 5   pantoprazole (PROTONIX) 40 MG tablet TAKE 1 TABLET BY MOUTH  DAILY 90 tablet 3   torsemide (DEMADEX) 20 MG tablet Take 1 tablet (20 mg total) by mouth daily. 30 tablet 0   vitamin B-12 (CYANOCOBALAMIN) 250 MCG tablet Take 1 tablet (250 mcg total) by mouth daily.     No current facility-administered medications on file prior to visit.     Family Hx: The patient's family history includes Arrhythmia in her mother; Coronary artery disease in her father; Depression in her sister and sister; Diabetes in her father, sister, and sister; Heart attack (age of onset: 54) in her father; Heart disease in her father and mother; Hyperlipidemia in her father, mother, sister, and sister; Hypertension in her father, mother, sister, and sister; Kidney disease in her paternal grandmother; Rheum arthritis in her mother; Stroke in her father.  ROS:   Please see the history of present illness.    Review of Systems  Constitutional:  Positive for malaise/fatigue.  HENT: Negative.    Respiratory: Negative.    Cardiovascular: Negative.   Gastrointestinal: Negative.   Musculoskeletal: Negative.        Unable to stand  Neurological: Negative.   Psychiatric/Behavioral: Negative.    All other systems reviewed and are negative.    Labs/Other Tests and Data Reviewed:    Recent Labs: 12/01/2020: TSH 2.650 01/18/2021: ALT 11; BUN 62; Creatinine, Ser 3.64; Hemoglobin 8.9; Magnesium  2.2; Platelets 259; Potassium 4.2; Sodium 135   Recent Lipid Panel Lab Results  Component Value Date/Time  CHOL 105 12/24/2019 05:13 AM   CHOL 110 01/18/2014 12:59 AM   TRIG 144 12/24/2019 05:13 AM   TRIG 122 01/18/2014 12:59 AM   HDL 35 (L) 12/24/2019 05:13 AM   HDL 35 (L) 01/18/2014 12:59 AM   CHOLHDL 3.0 12/24/2019 05:13 AM   LDLCALC 41 12/24/2019 05:13 AM   LDLCALC 51 01/18/2014 12:59 AM    Wt Readings from Last 3 Encounters:  02/11/21 200 lb (90.7 kg)  01/18/21 200 lb 9.9 oz (91 kg)  11/28/20 231 lb (104.8 kg)     Exam:    Vital Signs: Vital signs may also be detailed in the HPI BP 120/64   Pulse 68   Ht 5\' 3"  (1.6 m)   Wt 200 lb (90.7 kg)   BMI 35.43 kg/m   Wt Readings from Last 3 Encounters:  02/11/21 200 lb (90.7 kg)  01/18/21 200 lb 9.9 oz (91 kg)  11/28/20 231 lb (104.8 kg)   Temp Readings from Last 3 Encounters:  01/19/21 98.6 F (37 C) (Oral)  12/03/20 98.5 F (36.9 C) (Oral)  05/09/20 98.9 F (37.2 C) (Oral)   BP Readings from Last 3 Encounters:  02/11/21 120/64  01/19/21 (!) 108/45  12/03/20 (!) 154/64   Pulse Readings from Last 3 Encounters:  02/11/21 68  01/19/21 63  12/03/20 69     Well nourished, well developed female in no acute distress. Constitutional: alert,  No distress.    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 (HCC)     Other   Type 2 diabetes mellitus with complications ([place])   Other Visit Diagnoses     Morbid obesity ([place])       Essential hypertension          Chronic chest pain Previous  stress testing, cardiac catheterizations, echocardiograms Currently on hospice, no further work-up at this time   Peripheral artery disease ([place]) Bilateral carotid  disease As above, no further scanning ordered   Cerebrovascular accident (CVA) due to other mechanism [facility]) Prior TIAs, prior stroke  on her aspirin Plavix No recent events   Type 2 diabetes mellitus with complication, with long-term current use of insulin ([place]) Long history of poorly controlled diabetes Unable to exercise, sugars are controlled and with insulin   Mixed hyperlipidemia No changes   Essential hypertension Prior history labile numbers ,often running high For high blood pressure could add extra hydralazine or isosorbide Follow-up blood pressures, hold diuretic, increase fluids  COVID-19 Education: The signs and symptoms of COVID-19 were discussed with the patient and how to seek care for testing (follow up with PCP or arrange E-visit).  The importance of social distancing was discussed today.  Patient Risk:   After full review of this patients clinical status, I feel that they are at least moderate risk at this time.  Time:   Today, I have spent 25 minutes with the patient with telehealth technology discussing the cardiac and medical problems/diagnoses detailed above   Additional 10 min spent reviewing the chart prior to patient visit today   Medication Adjustments/Labs and Tests Ordered: Current medicines are reviewed at length with the patient today.  Concerns regarding medicines are outlined above.   Tests Ordered: No tests  ordered   Medication Changes: No changes made     Signed, Ida Rogue, MD  [place] Office [id number], [place], [place] [address]

## 2021-02-11 NOTE — Patient Instructions (Addendum)
It was a pleasure speaking with you today! Please call/ send a MyChart message for any questions/ concerns!  Medication Instructions:  No changes  For systolic pressure greater than 170 sustained could add extra hydralazine 50 up to 100 mg at lunch  If blood pressures continue to run high call our office, we may need to add additional half dose isosorbide as well as hydralazine  If you need a refill on your cardiac medications before your next appointment, please call your pharmacy.    Lab work: No new labs needed   If you have labs (blood work) drawn today and your tests are completely normal, you will receive your results only by: [place] (if you have MyChart) OR A paper copy in the mail If you have any lab test that is abnormal or we need to change your treatment, we will call you to review the results.   Testing/Procedures: No new testing needed   Follow-Up: At [facility], you and your health needs are our priority.  As part of our continuing mission to provide you with exceptional heart care, we have created designated Provider Care Teams.  These Care Teams include your primary Cardiologist (physician) and Advanced Practice Providers (APPs -  Physician Assistants and Nurse Practitioners) who all work together to provide you with the care you need, when you need it.  You will need a follow up appointment in as needed  Providers on your designated Care Team:   Murray Hodgkins, NP Christell Faith, PA-C Marrianne Mood, PA-C Cadence Kathlen Mody, [place]  Any Other Special Instructions Will Be Listed Below (If Applicable).  COVID-19 Vaccine Information can be found at: ShippingScam.co.uk For questions related to vaccine distribution or appointments, please email [email or web address] or call [phone].

## 2021-02-12 ENCOUNTER — Other Ambulatory Visit: Payer: Self-pay | Admitting: Family Medicine

## 2021-02-12 DIAGNOSIS — K219 Gastro-esophageal reflux disease without esophagitis: Secondary | ICD-10-CM

## 2021-02-12 NOTE — Telephone Encounter (Signed)
Requested Prescriptions  Pending Prescriptions Disp Refills  . pantoprazole (PROTONIX) 40 MG tablet [Pharmacy Med Name: Pantoprazole Sodium 40 MG Oral Tablet Delayed Release] 90 tablet 3    Sig: TAKE 1 TABLET BY MOUTH  DAILY     Gastroenterology: Proton Pump Inhibitors Passed - 02/12/2021  7:33 AM      Passed - Valid encounter within last 12 months    Recent Outpatient Visits          1 week ago Cerebrovascular accident (CVA) due to occlusion of left middle cerebral artery [facility])   [facility] Jerrol Banana., MD   11 months ago Pressure injury of skin of sacral region, unspecified injury stage   [place], [place], [place]   11 months ago Upper respiratory tract infection, unspecified type   [facility] Jerrol Banana., MD   12 months ago Urinary tract infection without hematuria, site unspecified   [facility] Jerrol Banana., MD   1 year ago Sinusitis, unspecified chronicity, unspecified location   [facility] Jerrol Banana., MD

## 2021-02-13 DIAGNOSIS — Z992 Dependence on renal dialysis: Secondary | ICD-10-CM | POA: Diagnosis not present

## 2021-02-13 DIAGNOSIS — N186 End stage renal disease: Secondary | ICD-10-CM | POA: Diagnosis not present

## 2021-02-14 ENCOUNTER — Encounter: Payer: Self-pay | Admitting: Family Medicine

## 2021-02-18 ENCOUNTER — Other Ambulatory Visit: Payer: Self-pay | Admitting: *Deleted

## 2021-02-18 MED ORDER — INSULIN ASPART 100 UNIT/ML IJ SOLN: 5.0000 [IU] | Freq: Three times a day (TID) | INTRAMUSCULAR | 11 refills | Status: AC

## 2021-02-18 MED ORDER — INSULIN GLARGINE 100 UNIT/ML ~~LOC~~ SOLN: 10.0000 [IU] | Freq: Every day | SUBCUTANEOUS | 11 refills | Status: AC

## 2021-02-18 NOTE — Telephone Encounter (Signed)
Sent rx's to pharmacy.

## 2021-02-20 DIAGNOSIS — N183 Chronic kidney disease, stage 3 unspecified: Secondary | ICD-10-CM | POA: Diagnosis not present

## 2021-02-20 DIAGNOSIS — G459 Transient cerebral ischemic attack, unspecified: Secondary | ICD-10-CM | POA: Diagnosis not present

## 2021-02-20 DIAGNOSIS — N171 Acute kidney failure with acute cortical necrosis: Secondary | ICD-10-CM | POA: Diagnosis not present

## 2021-02-20 DIAGNOSIS — R531 Weakness: Secondary | ICD-10-CM | POA: Diagnosis not present

## 2021-02-24 ENCOUNTER — Telehealth: Payer: Self-pay

## 2021-02-24 NOTE — Telephone Encounter (Signed)
Copied from [place] [id number]. Topic: General - Other >> Feb 24, 2021 12:26 PM Oneta Rack wrote: Osvaldo Human name: Crystal  Relation to pt: from [place] Call back number:  [phone] fax # [phone]    Reason for call: Checking on the status of plan of care order # [id number],  3 page document faxed on 02/22/2021 requesting PCP fill out forms,  sign and fax back. Caller will refax to [phone]. Please note when received

## 2021-02-25 DIAGNOSIS — U071 COVID-19: Secondary | ICD-10-CM | POA: Diagnosis not present

## 2021-02-25 DIAGNOSIS — M6281 Muscle weakness (generalized): Secondary | ICD-10-CM | POA: Diagnosis not present

## 2021-03-04 ENCOUNTER — Telehealth (INDEPENDENT_AMBULATORY_CARE_PROVIDER_SITE_OTHER): Payer: Self-pay

## 2021-03-04 NOTE — Telephone Encounter (Signed)
Reach out to schedule patient for perm cath removal and patient daughter informed that they would not be able to schedule at this time. Patient is currently with hospice.

## 2021-03-17 DIAGNOSIS — E1140 Type 2 diabetes mellitus with diabetic neuropathy, unspecified: Secondary | ICD-10-CM

## 2021-03-17 DIAGNOSIS — E1122 Type 2 diabetes mellitus with diabetic chronic kidney disease: Secondary | ICD-10-CM

## 2021-03-17 DIAGNOSIS — I2510 Atherosclerotic heart disease of native coronary artery without angina pectoris: Secondary | ICD-10-CM

## 2021-03-17 DIAGNOSIS — I69354 Hemiplegia and hemiparesis following cerebral infarction affecting left non-dominant side: Secondary | ICD-10-CM

## 2021-03-17 DIAGNOSIS — N186 End stage renal disease: Secondary | ICD-10-CM

## 2021-03-17 DIAGNOSIS — R1312 Dysphagia, oropharyngeal phase: Secondary | ICD-10-CM

## 2021-03-17 DIAGNOSIS — I69398 Other sequelae of cerebral infarction: Secondary | ICD-10-CM

## 2021-03-17 DIAGNOSIS — I120 Hypertensive chronic kidney disease with stage 5 chronic kidney disease or end stage renal disease: Secondary | ICD-10-CM

## 2021-03-17 DIAGNOSIS — G9340 Encephalopathy, unspecified: Secondary | ICD-10-CM

## 2021-03-17 DIAGNOSIS — M6281 Muscle weakness (generalized): Secondary | ICD-10-CM

## 2021-03-17 DEATH — deceased

## 2021-03-20 IMAGING — CR DG TIBIA/FIBULA 2V*R*
1 series · 4 of 4 positions shown · non-contrast
Comparison: None.

CLINICAL DATA: Fell out of bed with right lower leg injury.

EXAM:
RIGHT TIBIA AND FIBULA - 2 VIEW

[Series 1: dg tibia/fibula right · 0.14mm/px · 4 of 4 slices shown]
[im 1/4]
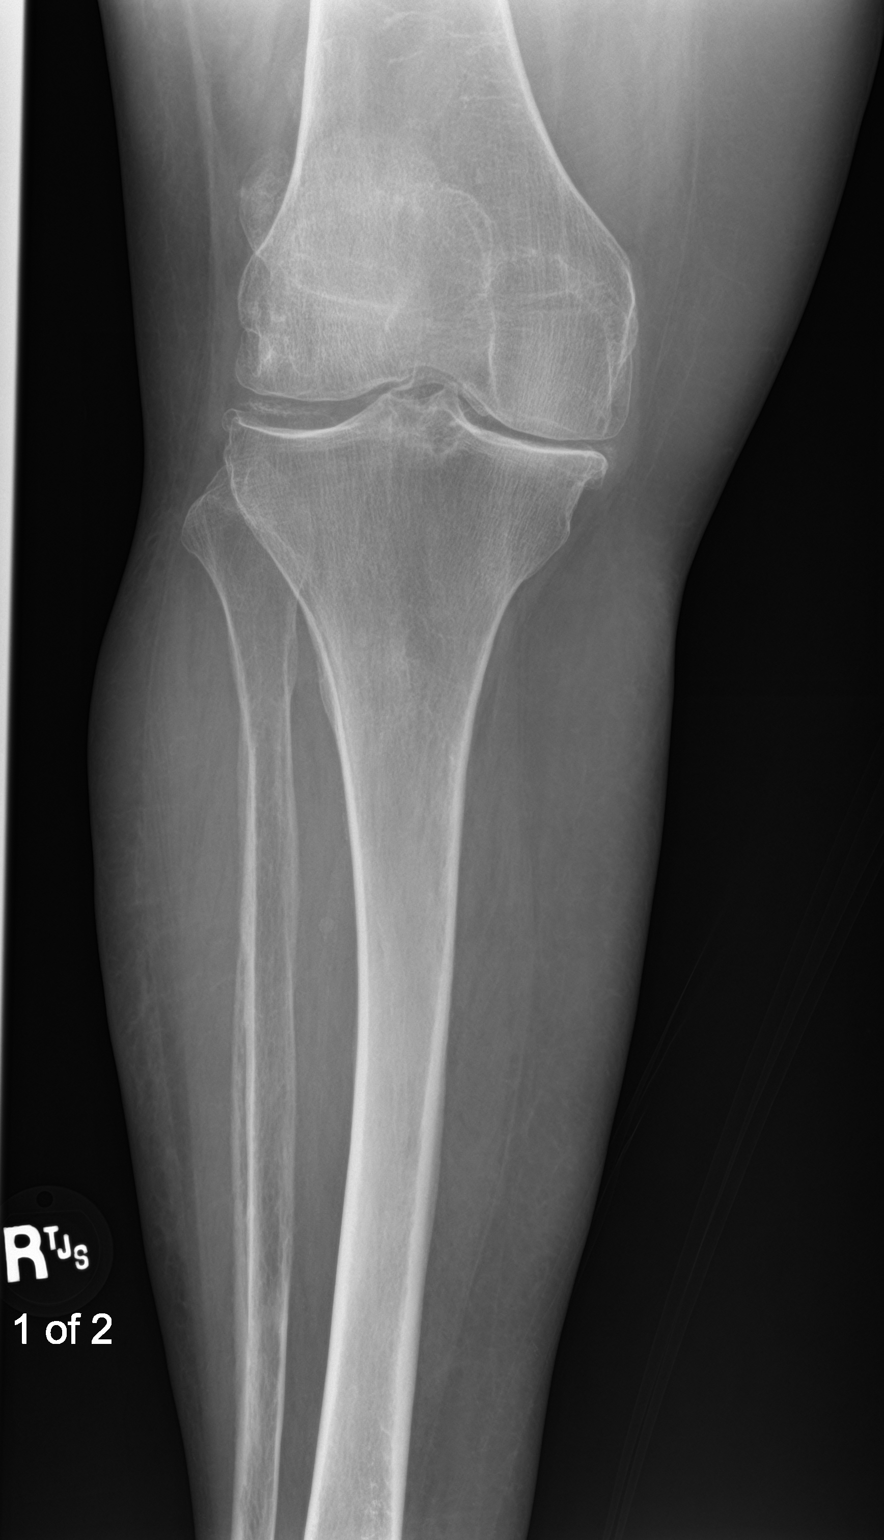
[im 2/4]
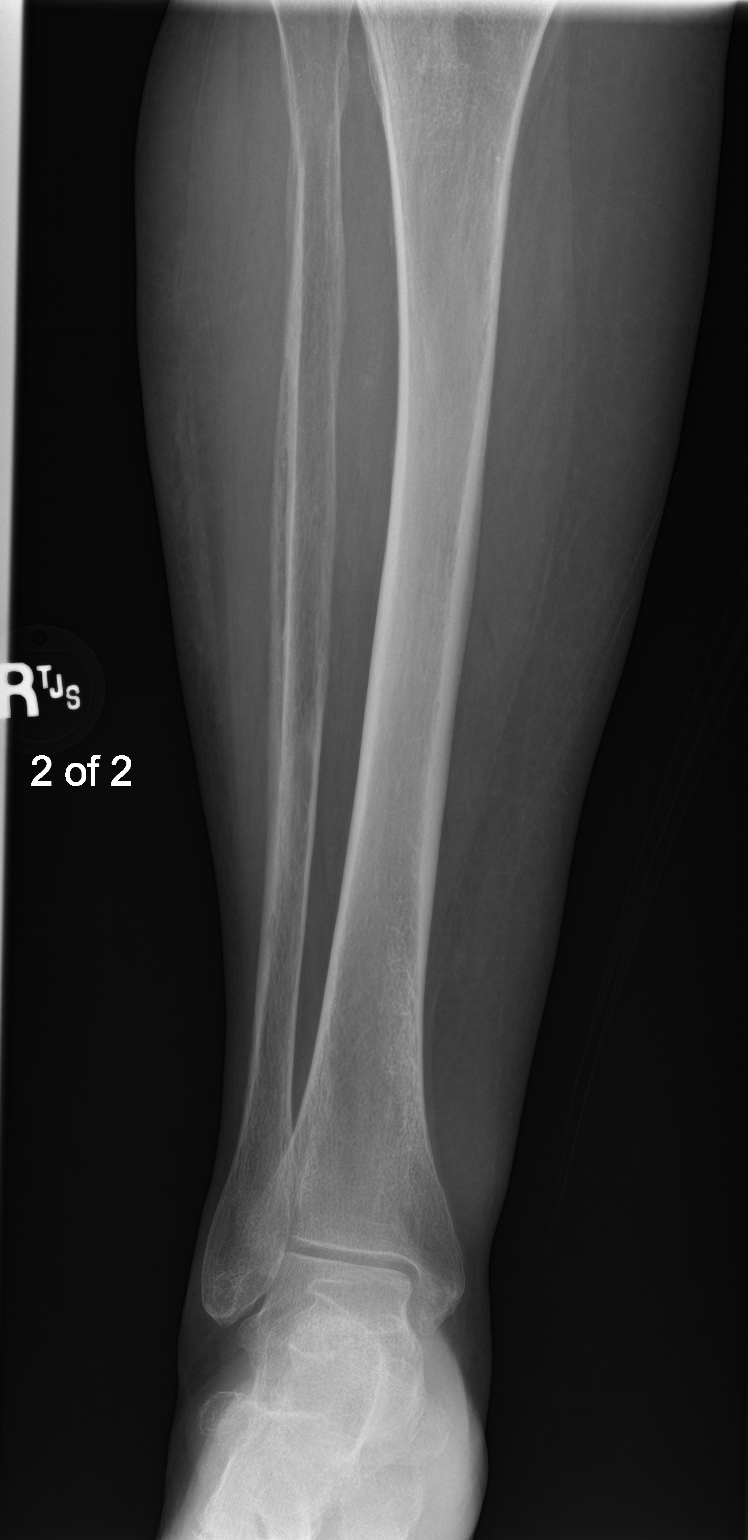
[im 3/4]
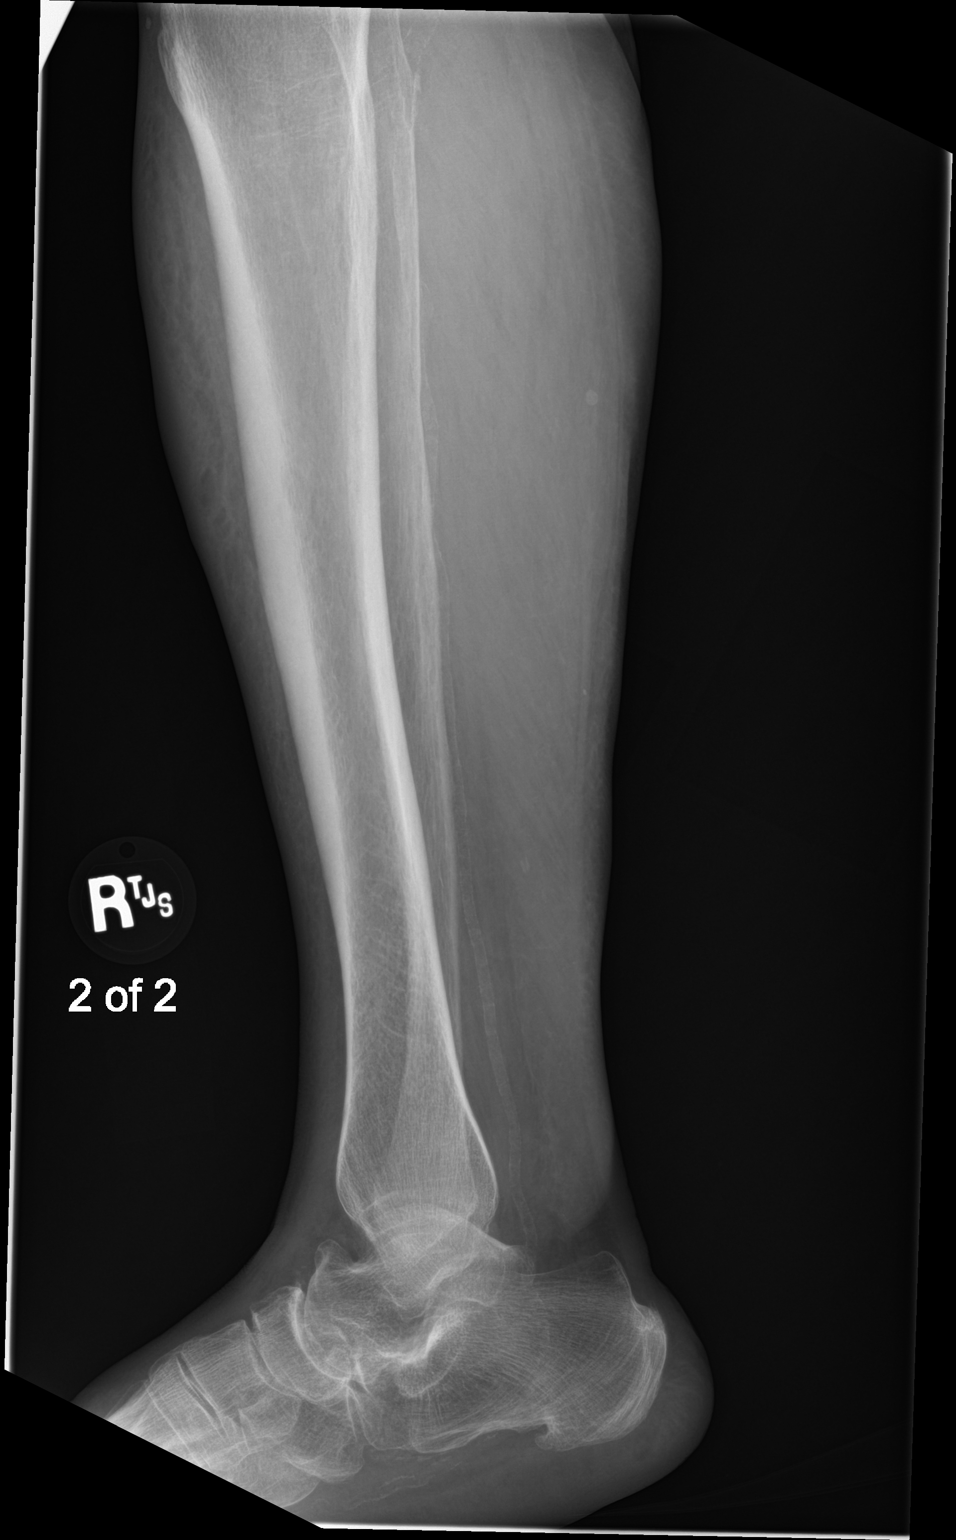
[im 4/4]
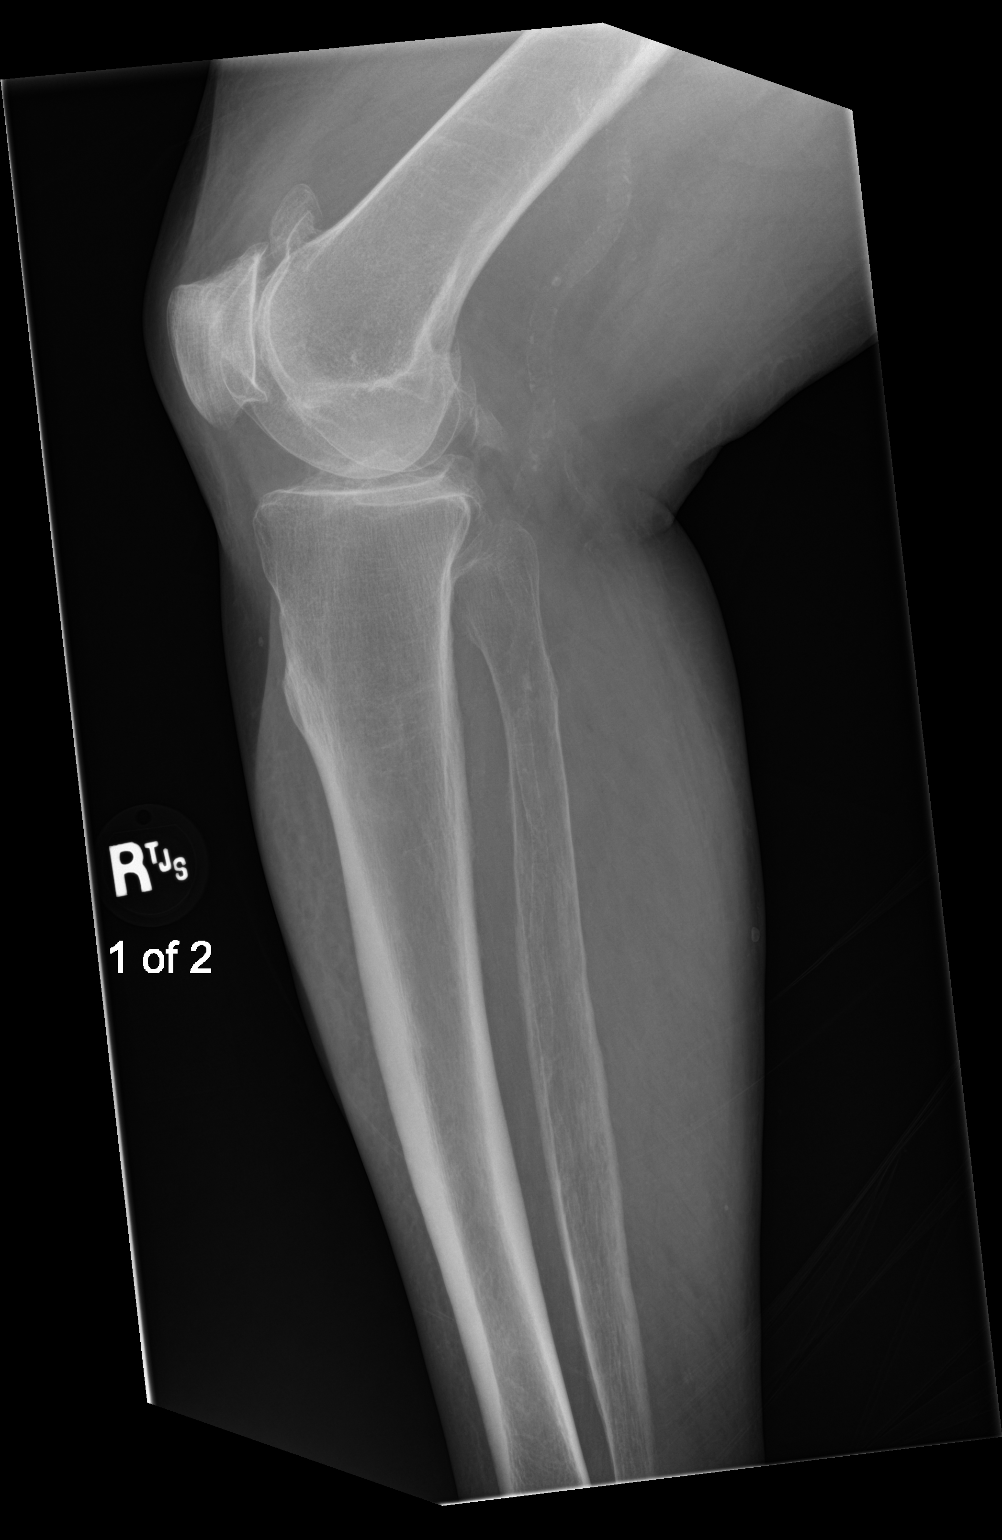

[4 of 4 positions shown; findings below may reference images not displayed]

FINDINGS: Mild osteopenia. Mild-to-moderate osteoarthritis of the right knee.
Chondrocalcinosis over the medial and lateral compartments. Spurring
over the superolateral aspect of the distal femoral condyle. No
acute fracture or dislocation. Mild degenerate change over the
talonavicular joint with dorsal beaking of the talus.
IMPRESSION: No acute fracture.

Degenerative changes over the knee and talonavicular joint.

## 2021-07-10 IMAGING — CR DG KNEE COMPLETE 4+V*R*
1 series · 4 of 4 positions shown · non-contrast
Comparison: Right tibia and fibula x-rays dated May 22, 2019.

CLINICAL DATA: Right knee pain after fall yesterday.

EXAM:
RIGHT KNEE - COMPLETE 4+ VIEW

[Series 1: dg knee complete 4 views right · 0.14mm/px · 4 of 4 slices shown]
[im 1/4]
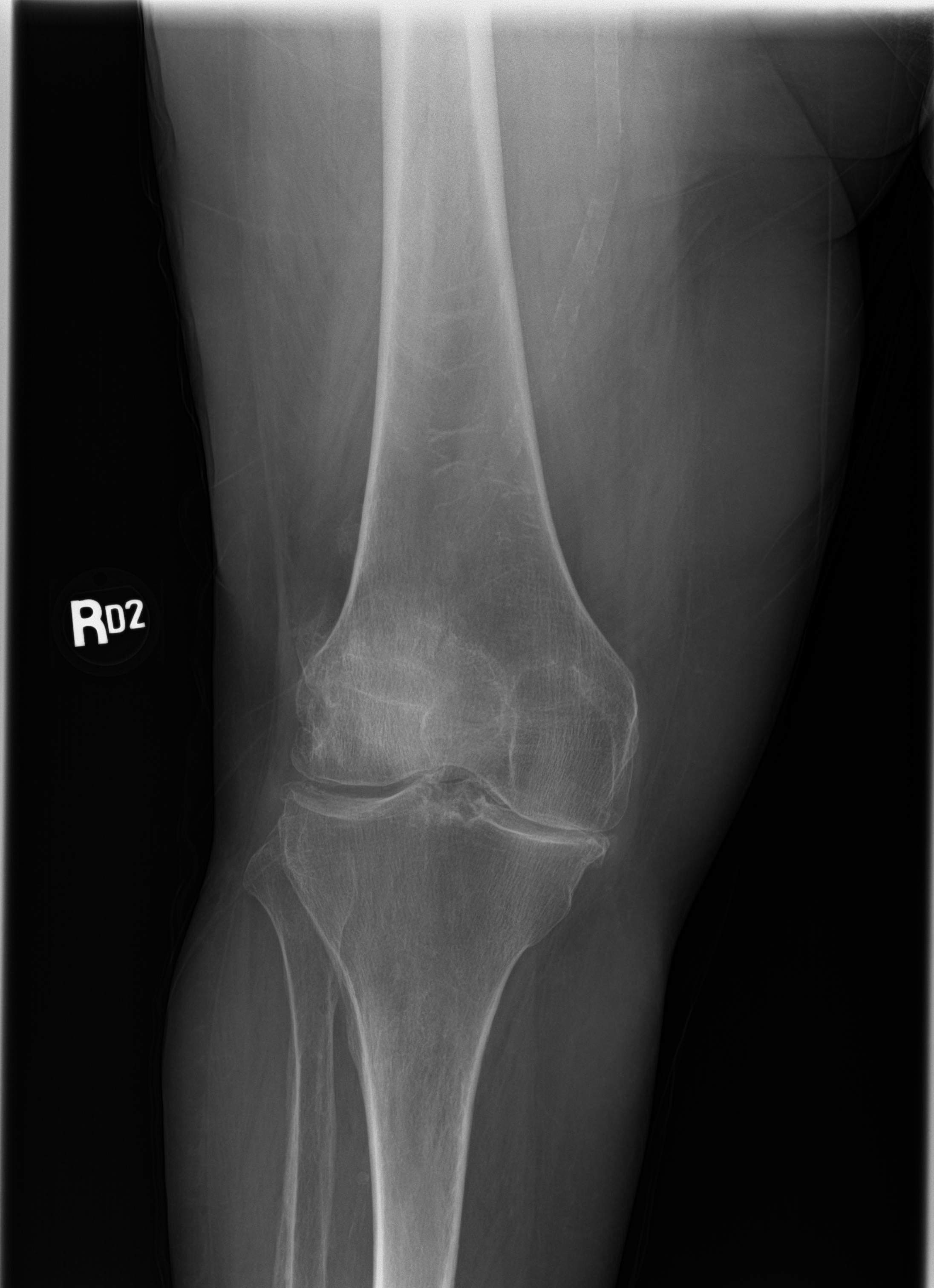
[im 2/4]
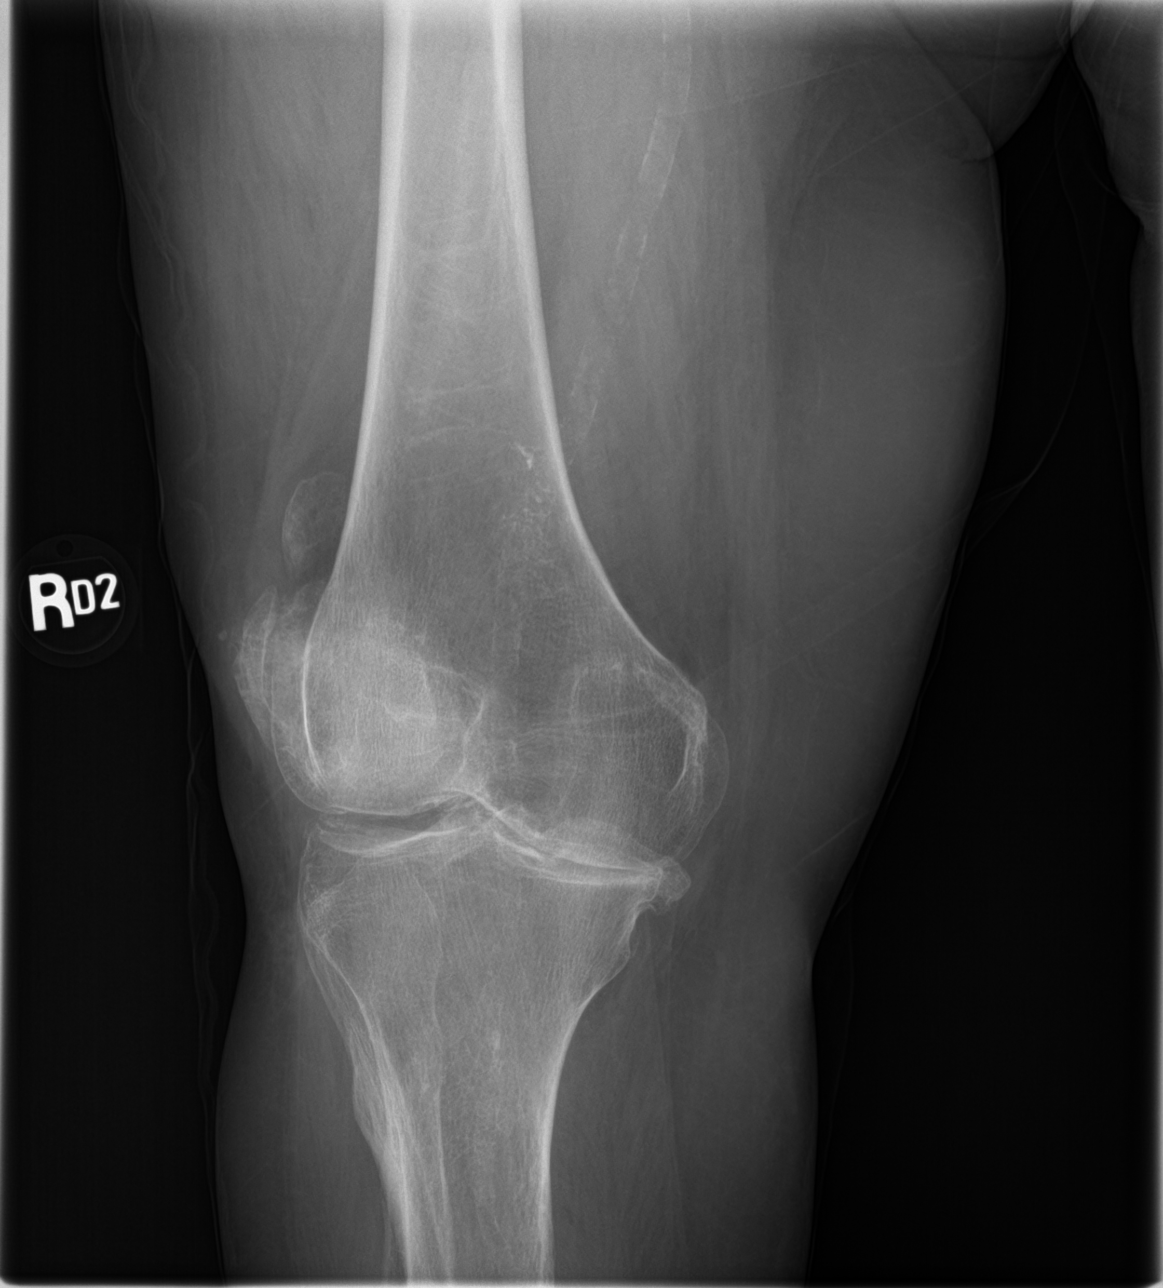
[im 3/4]
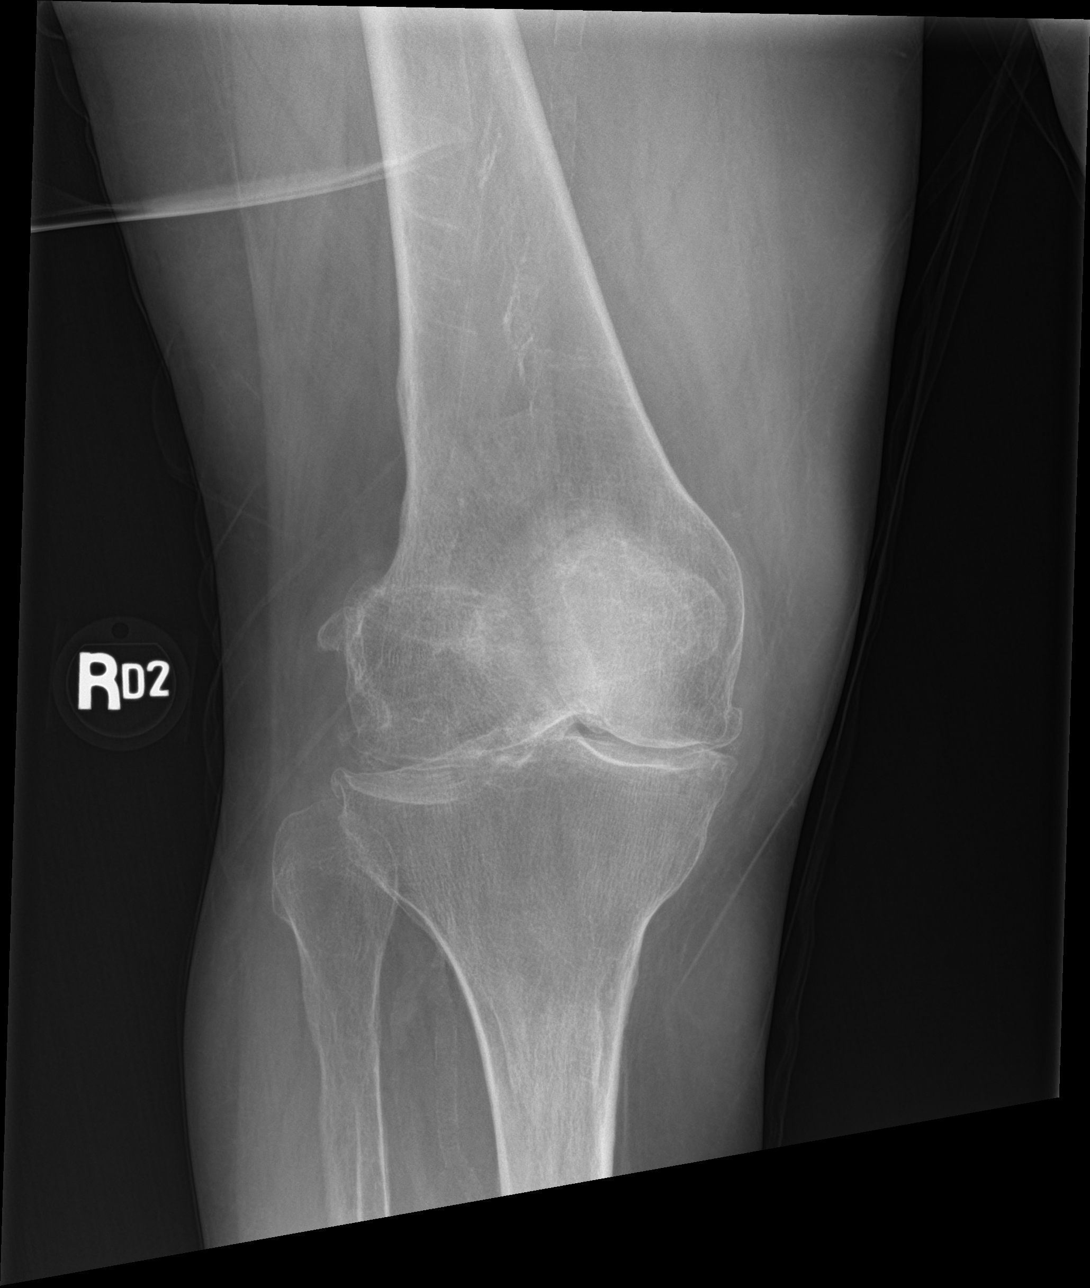
[im 4/4]
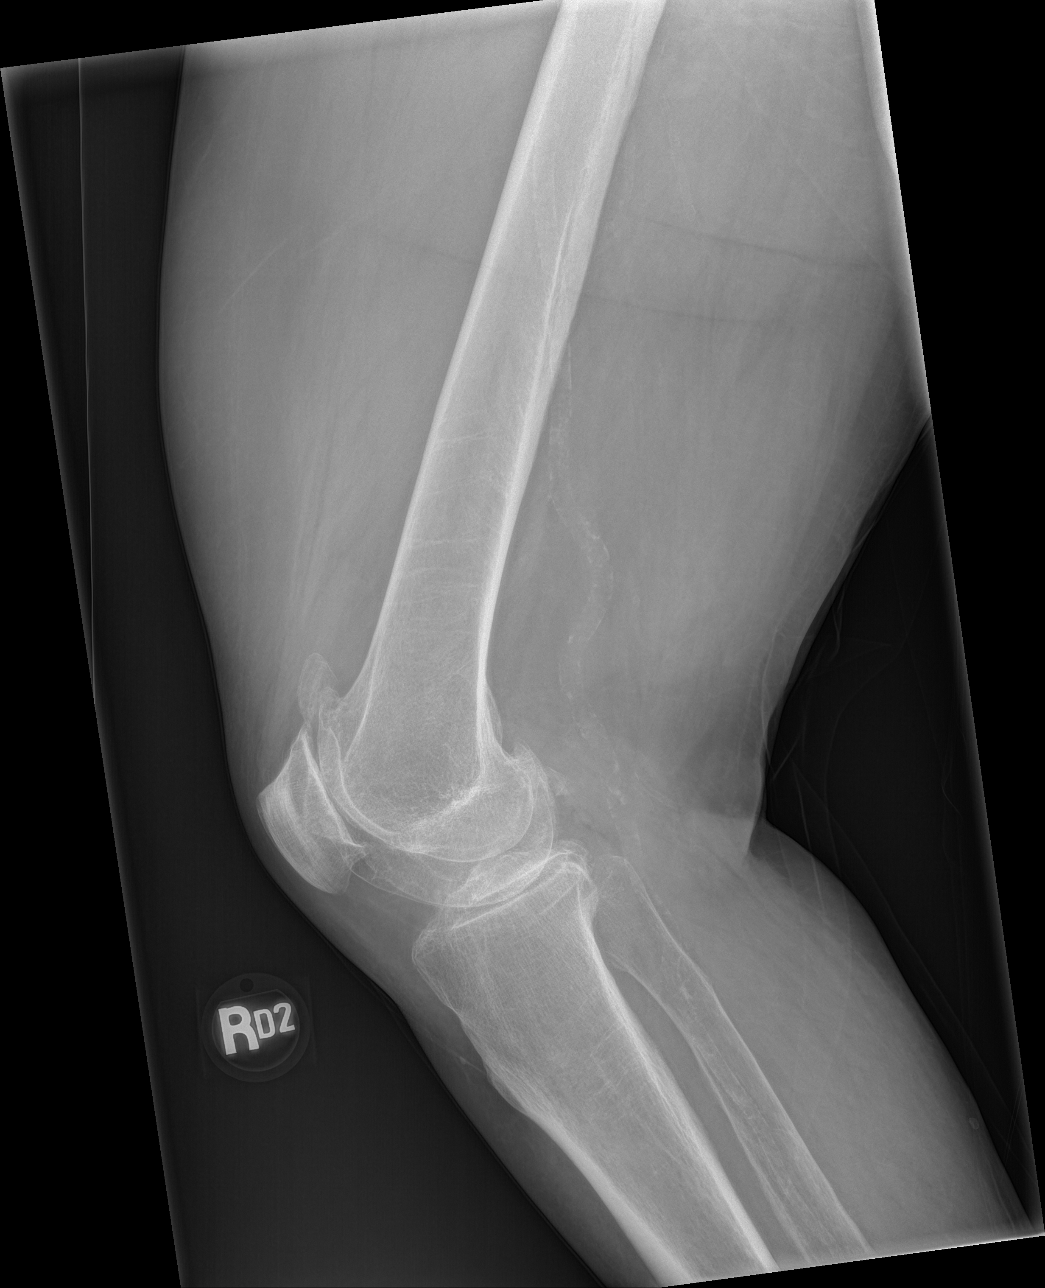

[4 of 4 positions shown; findings below may reference images not displayed]

FINDINGS: No acute fracture or dislocation. No joint effusion. Unchanged
moderate medial and mild lateral and patellofemoral compartment
joint space narrowing with marginal osteophytes. Chondrocalcinosis
of the menisci again noted. Osteopenia. Vascular calcifications.
IMPRESSION: 1.  No acute osseous abnormality.
2. Unchanged mild-to-moderate tricompartmental osteoarthritis.

## 2021-07-10 IMAGING — CR DG FOOT COMPLETE 3+V*R*
1 series · 3 of 3 positions shown · non-contrast
Comparison: Right foot x-rays dated April 26, 2015.

CLINICAL DATA: Right foot pain after fall yesterday.

EXAM:
RIGHT FOOT COMPLETE - 3+ VIEW

[Series 1: dg foot complete right · 0.14mm/px · 3 of 3 slices shown]
[im 1/3]
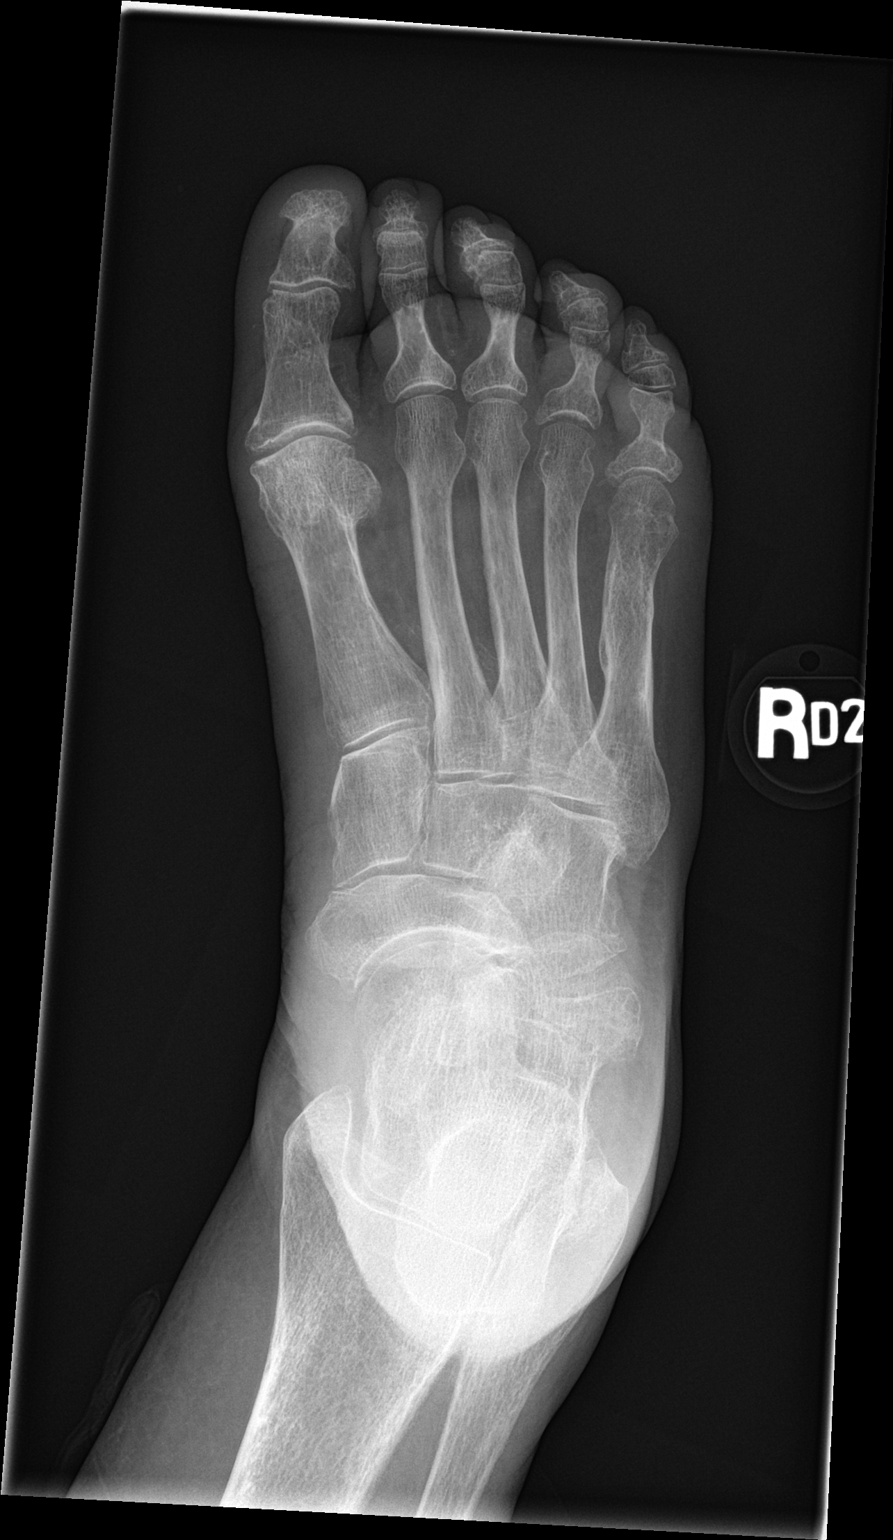
[im 2/3]
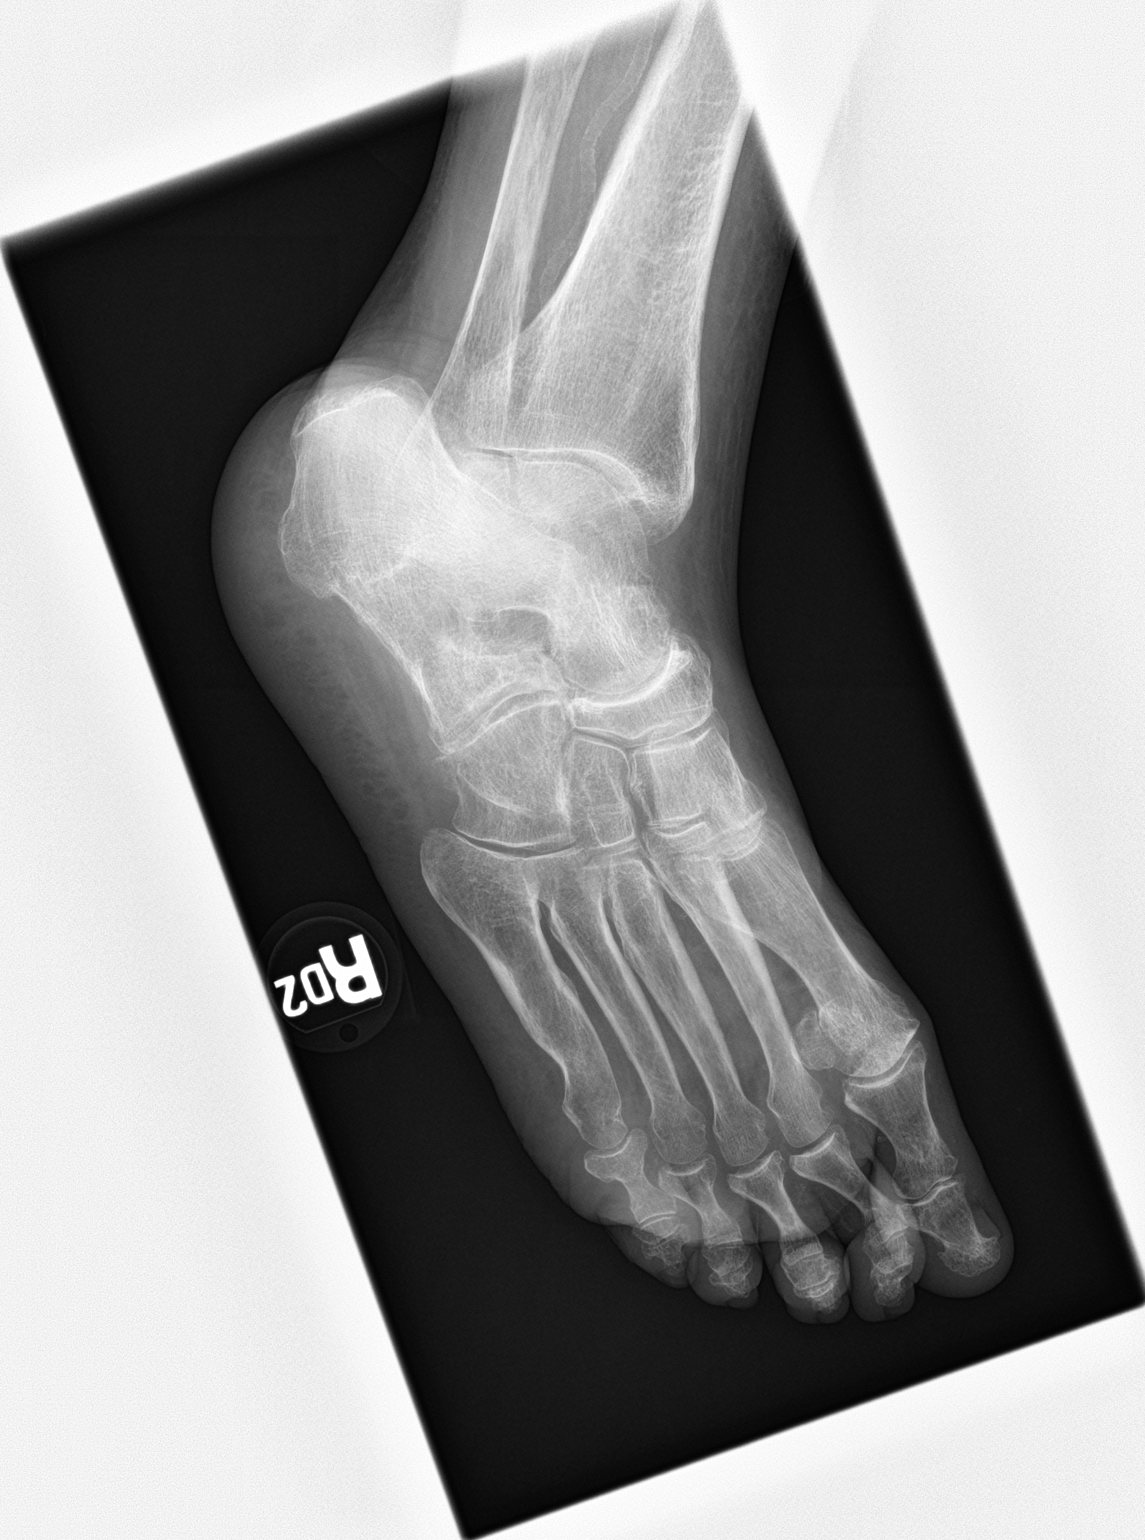
[im 3/3]
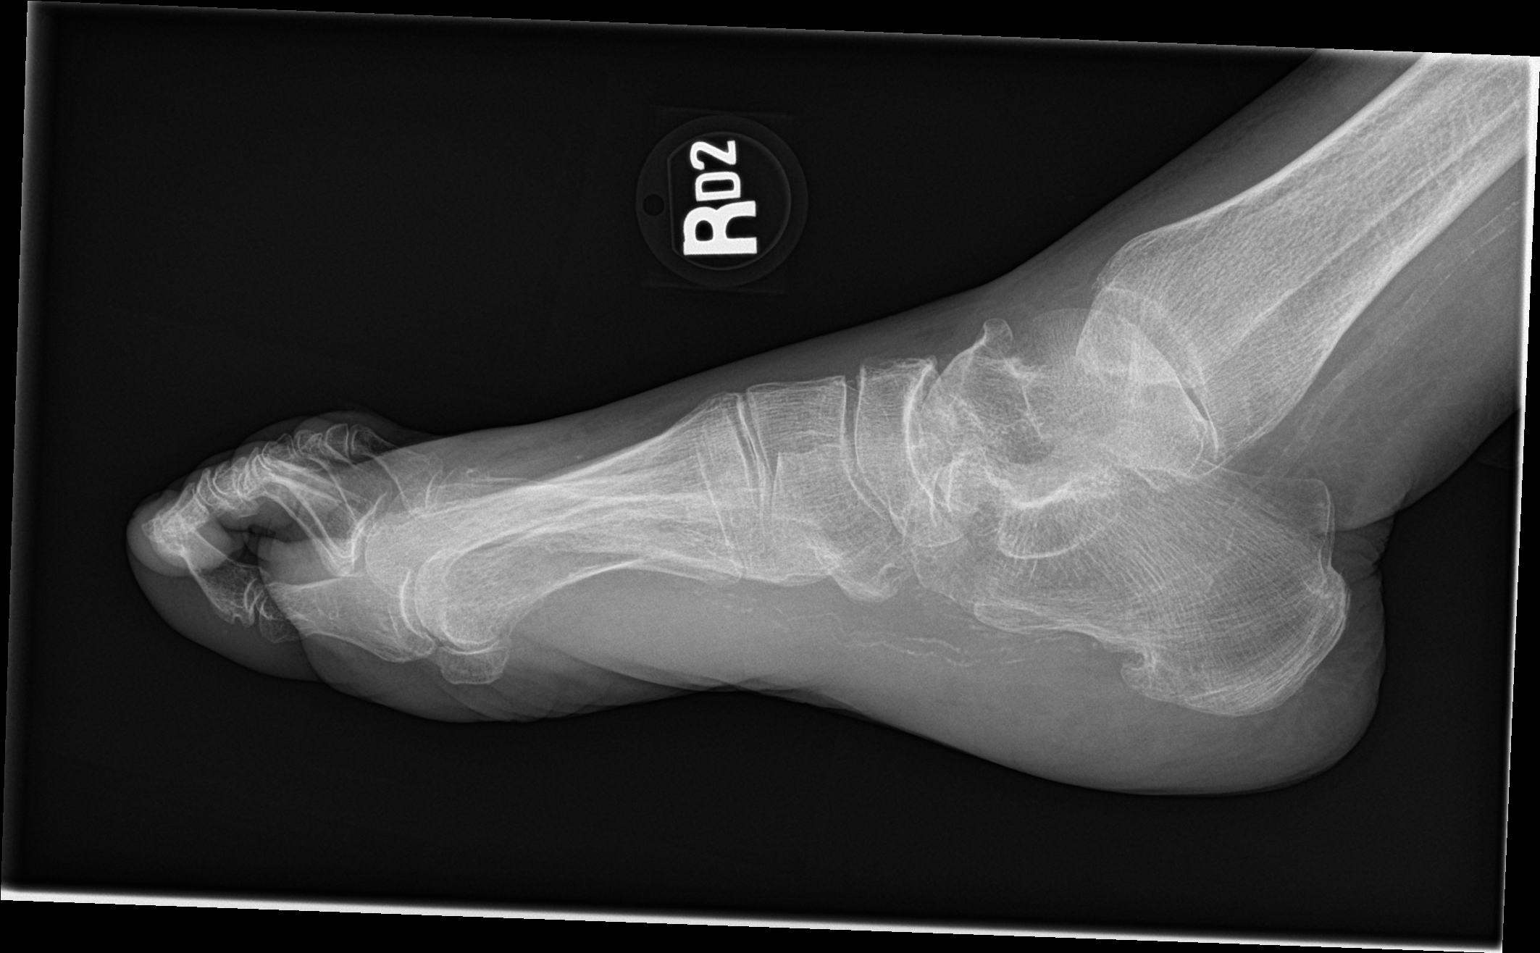

[3 of 3 positions shown; findings below may reference images not displayed]

FINDINGS: Acute nondisplaced intra-articular fracture through the medial base
of the fourth proximal phalanx. No dislocation. Old healed fracture
of the fifth metatarsal. Similar appearing mild degenerative changes
of the first MTP joint. Osteopenia. Plantar enthesophyte. Vascular
calcifications. Soft tissues are unremarkable.
IMPRESSION: 1. Acute nondisplaced fracture through the medial base of the fourth
proximal phalanx.

## 2021-09-07 IMAGING — CT CT MAXILLOFACIAL W/O CM
3 series · 14 of 47 positions shown, 16 images · non-contrast
Comparison: September 25, 2018

CLINICAL DATA: Fall after hitting her left side of the face

EXAM:
CT HEAD WITHOUT CONTRAST
TECHNIQUE: Contiguous axial images were obtained from the base of the skull
through the vertex without intravenous contrast.

[Series 3: max soft · axial · 0.34mm/px · z∈[-243,-111]mm · 8 of 78 slices shown, 10 images]
[im 6/78  brain]
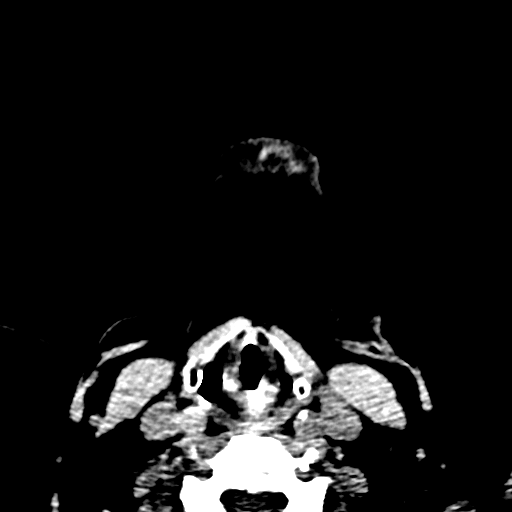
[im 6/78  bone]
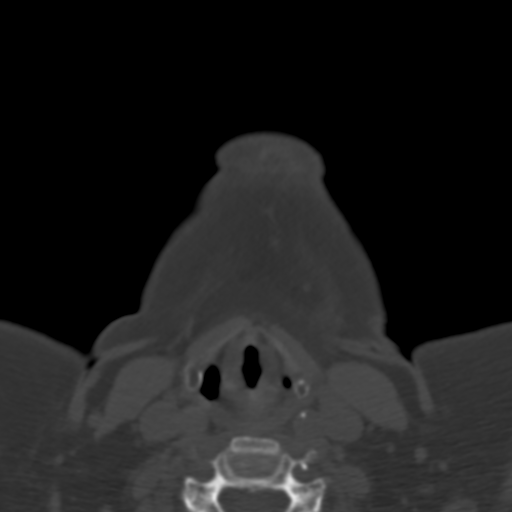
[im 16/78  bone]
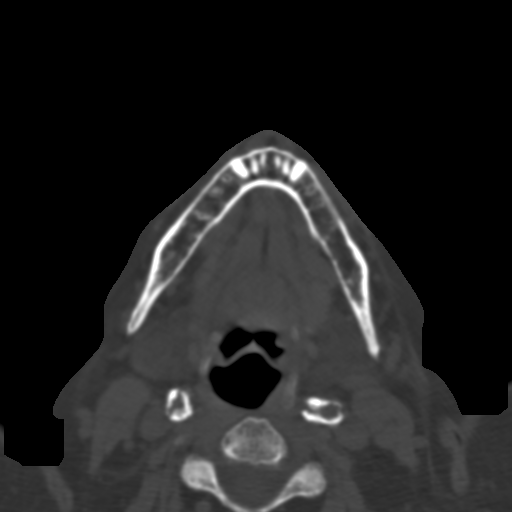
[im 24/78  bone]
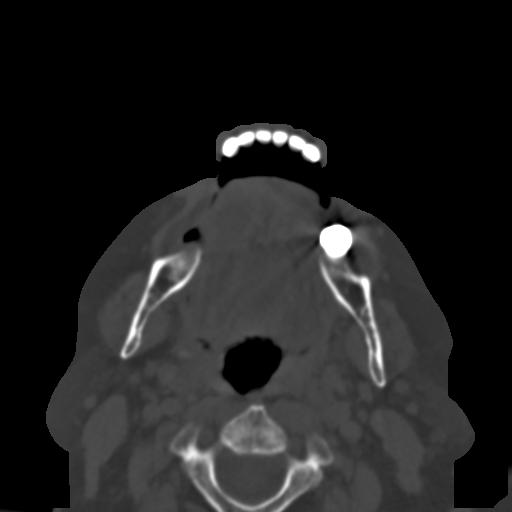
[im 35/78  bone]
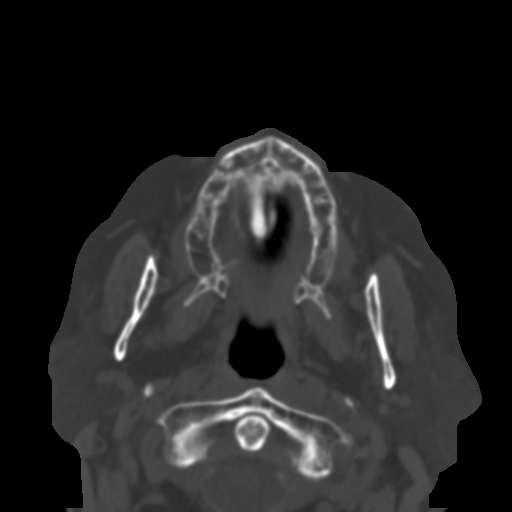
[im 43/78  brain]
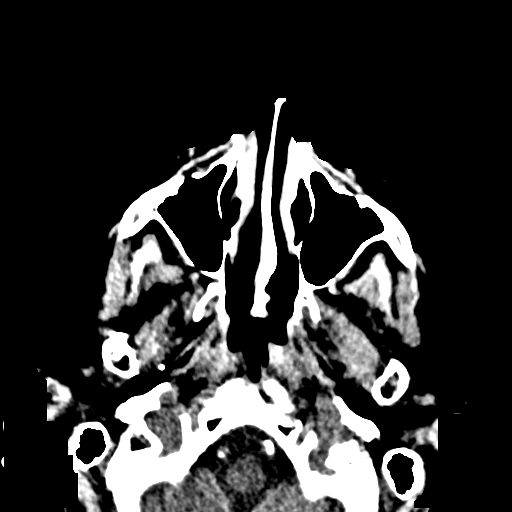
[im 43/78  bone]
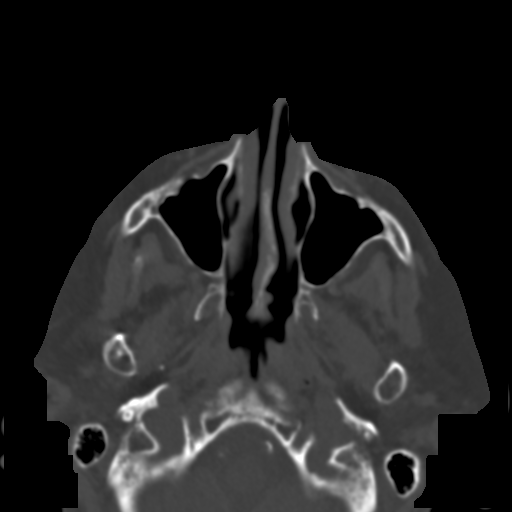
[im 54/78  bone]
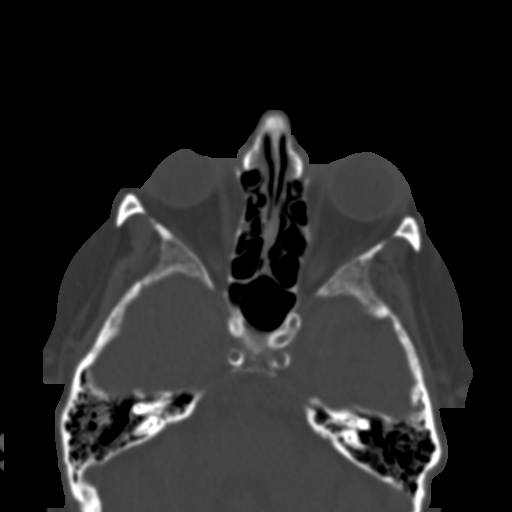
[im 62/78  bone]
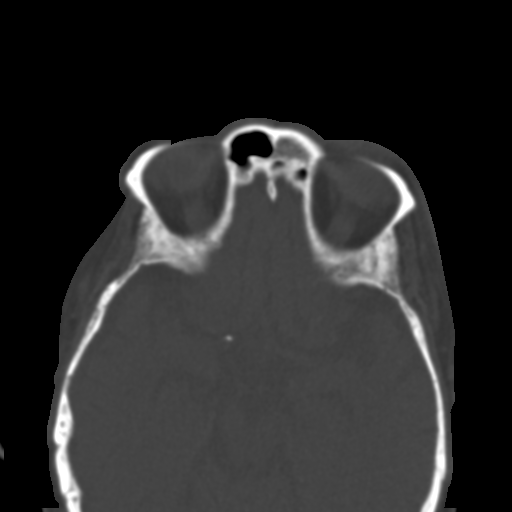
[im 72/78  bone]
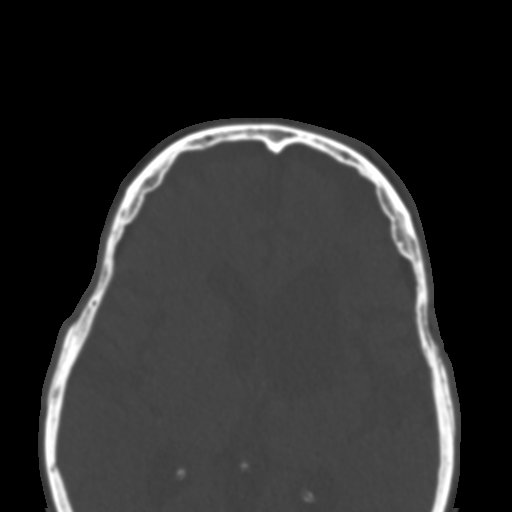

[Series 6: coronal soft · coronal · 0.31mm/px · 3 of 72 slices shown]
[im 24/72  bone]
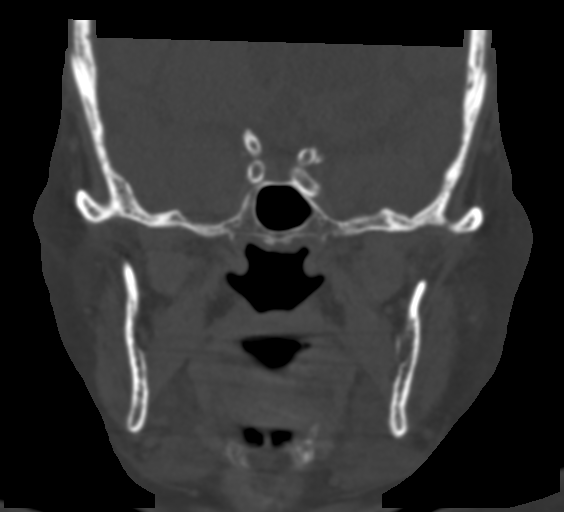
[im 32/72  bone]
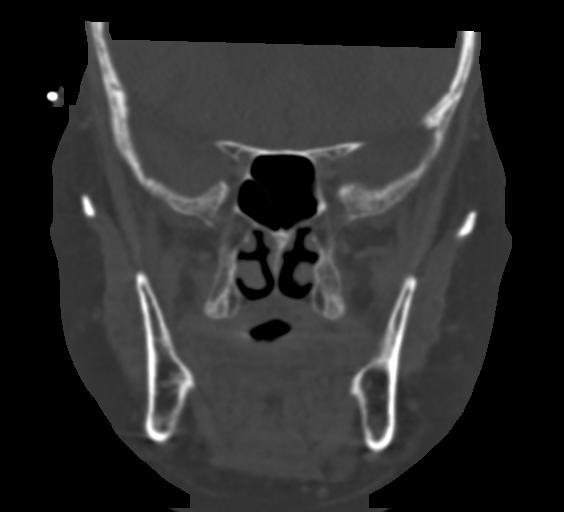
[im 40/72  bone]
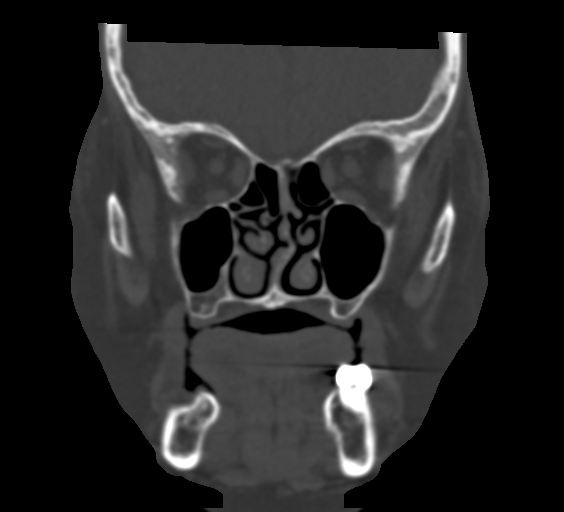

[Series 7: sagittal soft · sagittal · 0.28mm/px · 3 of 87 slices shown]
[im 29/87  bone]
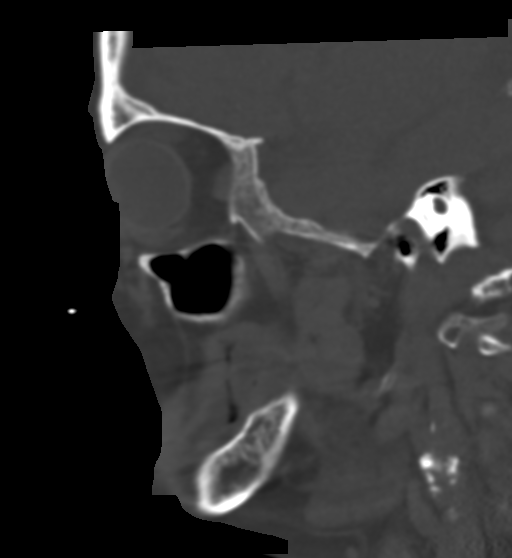
[im 44/87  bone]
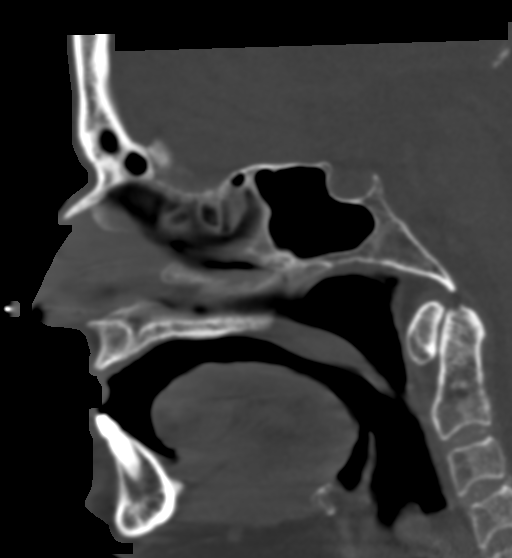
[im 58/87  bone]
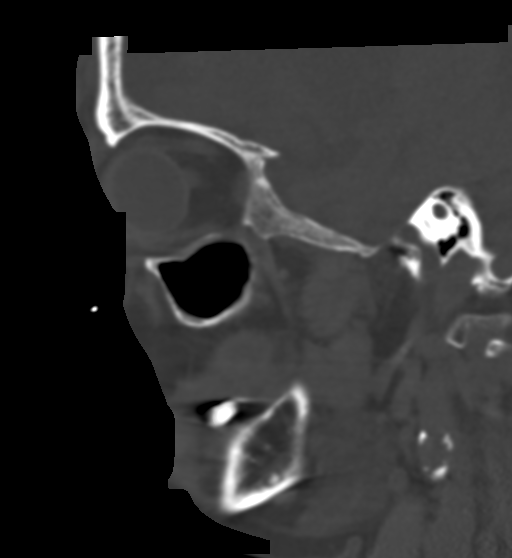

[14 of 47 positions shown; findings below may reference images not displayed]

FINDINGS: Brain: No evidence of acute territorial infarction, hemorrhage,
hydrocephalus,extra-axial collection or mass lesion/mass effect.
There is dilatation the ventricles and sulci consistent with
age-related atrophy. Low-attenuation changes in the deep white
matter consistent with small vessel ischemia. Again noted is a prior
infarct involving the left frontal lobe with ex vacuo dilatation of
the left lateral ventricle. Prior lacunar infarcts cervical within
the bilateral basal ganglia.

Vascular: No hyperdense vessel or unexpected calcification.

Skull: The skull is intact. No fracture or focal lesion identified.

Sinuses/Orbits: The visualized paranasal sinuses and mastoid air
cells are clear. The orbits and globes intact.

Other: There is a small soft tissue hematoma seen overlying the left
frontal skull and periorbital region.

Face:

Osseous: No acute fracture or other significant osseous
abnormality.The nasal bone, mandibles, zygomatic arches and
pterygoid plates are intact.

Orbits: No fracture identified. Unremarkable appearance of globes
and orbits. No retro-orbital collections are seen.

Sinuses: The visualized paranasal sinuses and mastoid air cells are
unremarkable.

Soft tissues: Small soft tissue hematoma seen overlying the left
frontal skull and periorbital region.

Limited intracranial: No acute findings.

Cervical spine:

Alignment: There is straightening of the normal cervical lordosis.

Skull base and vertebrae: Visualized skull base is intact. No
atlanto-occipital dissociation. The vertebral body heights are well
maintained. No fracture or pathologic osseous lesion seen.

Soft tissues and spinal canal: The visualized paraspinal soft
tissues are unremarkable. No prevertebral soft tissue swelling is
seen. The spinal canal is grossly unremarkable, no large epidural
collection or significant canal narrowing.

Disc levels: Disc height loss with uncovertebral osteophytes and
disc osteophyte complex is most notable at C5-C6 and C6-C7 with mild
neural foraminal and canal narrowing.

Upper chest: The lung apices are clear. Thoracic inlet is within
normal limits.

Other: Scattered carotid artery calcifications are seen. Aortic
atherosclerosis is noted. For which
IMPRESSION: No acute intracranial abnormality.

Findings consistent with age related atrophy and chronic small
vessel ischemia

Prior lacunar infarcts as described above

Soft tissue hematoma and swelling over the left frontal skull and
periorbital region

No acute facial fracture

No acute fracture or malalignment of the spine.

## 2021-09-07 IMAGING — CT CT HEAD W/O CM
3 series · 14 of 47 positions shown, 16 images · non-contrast
Comparison: September 25, 2018

CLINICAL DATA: Fall after hitting her left side of the face

EXAM:
CT HEAD WITHOUT CONTRAST
TECHNIQUE: Contiguous axial images were obtained from the base of the skull
through the vertex without intravenous contrast.

[Series 3: head wo · axial · 0.42mm/px · z∈[-151,-26]mm · 8 of 31 slices shown, 10 images]
[im 3/31  brain]
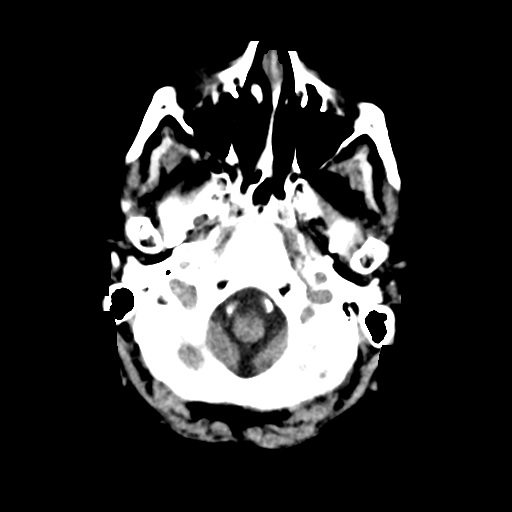
[im 3/31  bone]
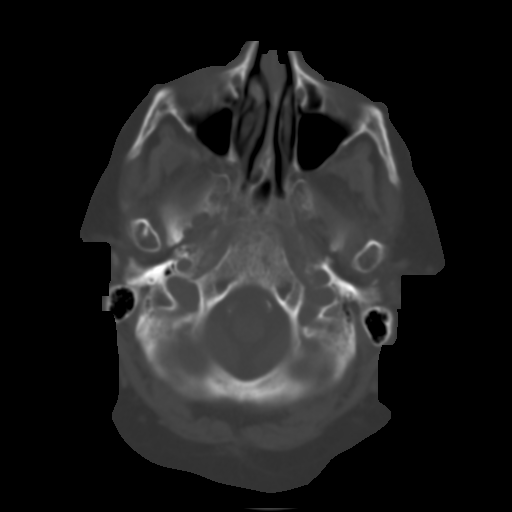
[im 7/31  brain]
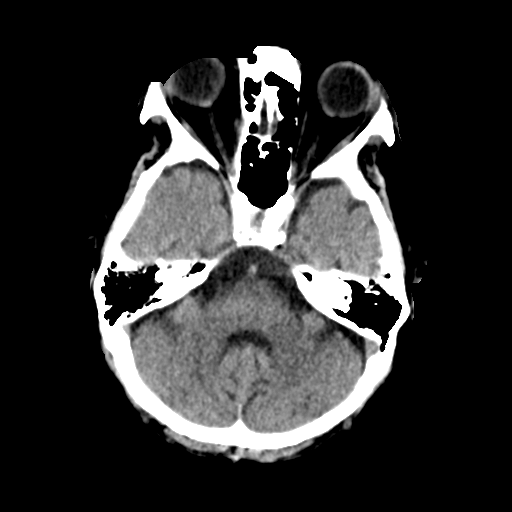
[im 10/31  brain]
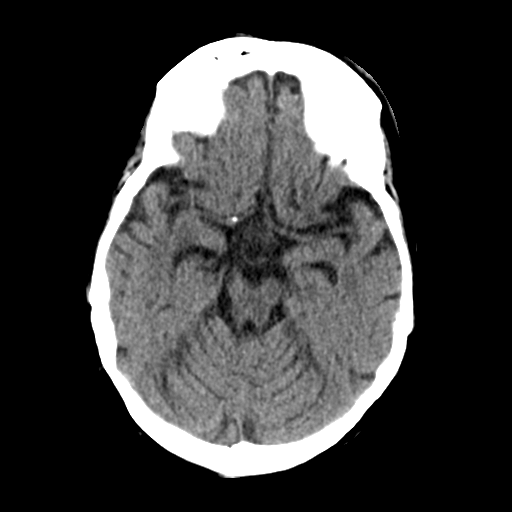
[im 14/31  brain]
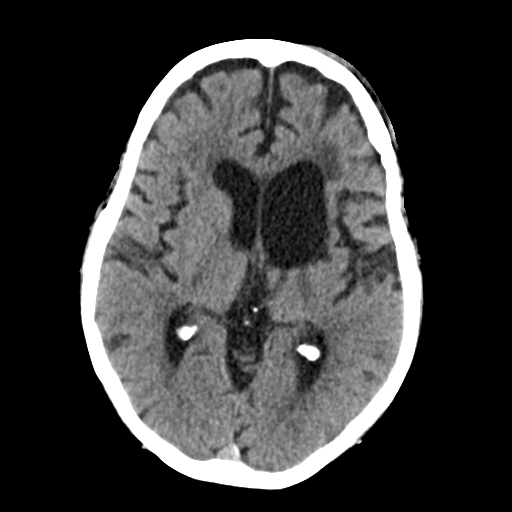
[im 17/31  brain]
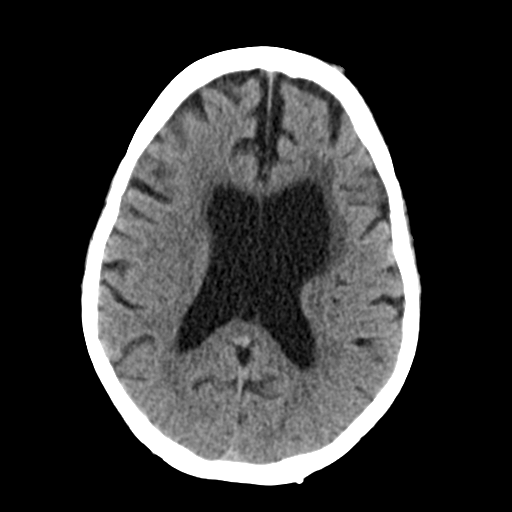
[im 17/31  bone]
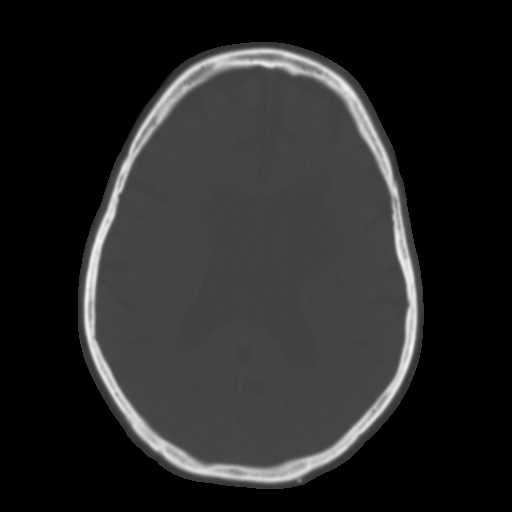
[im 21/31  brain]
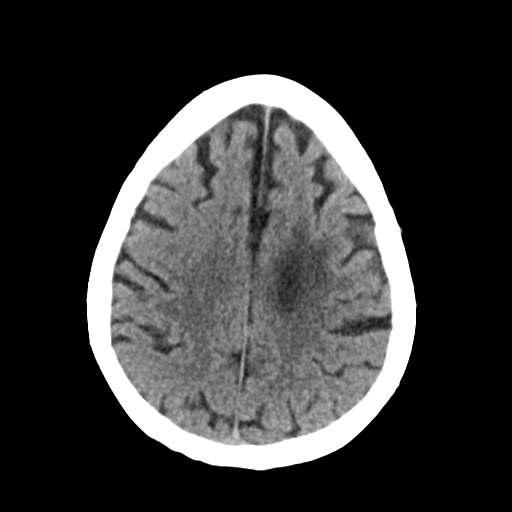
[im 24/31  brain]
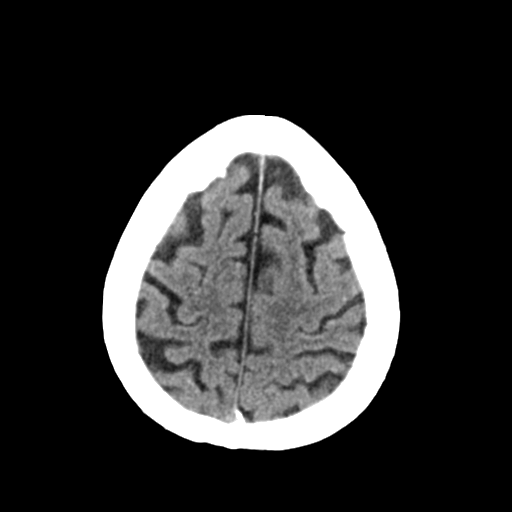
[im 28/31  brain]
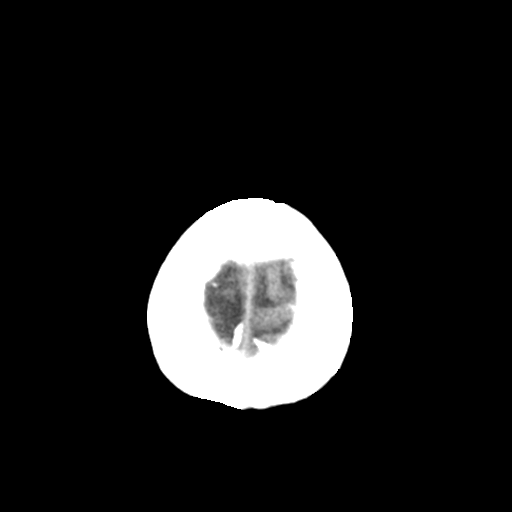

[Series 4: coronal soft tissue · coronal · 0.29mm/px · 3 of 63 slices shown]
[im 24/63  brain]
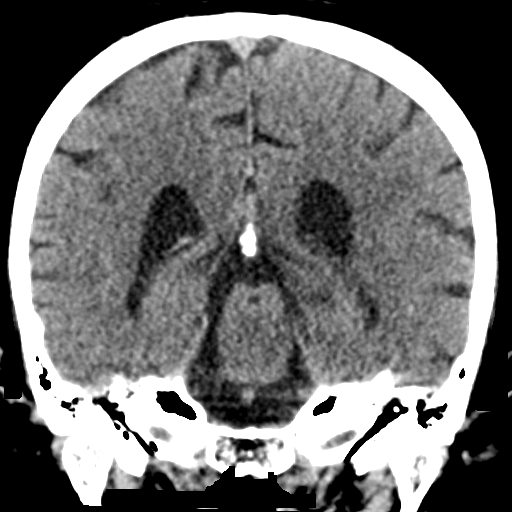
[im 29/63  brain]
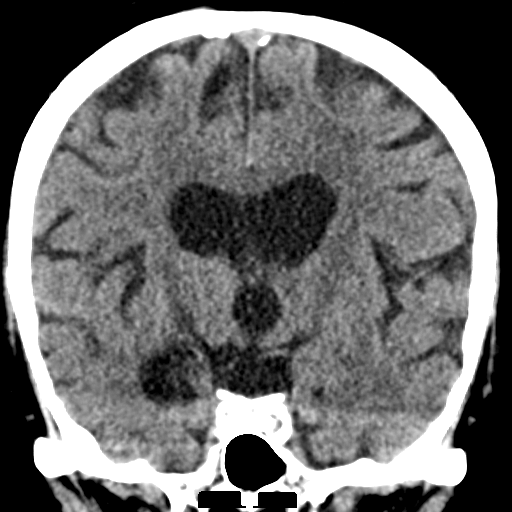
[im 34/63  brain]
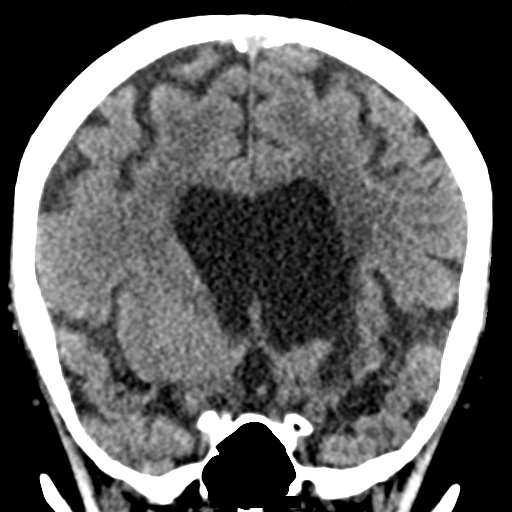

[Series 5: sagittal soft tissue · sagittal · 0.29mm/px · 3 of 50 slices shown]
[im 17/50  brain]
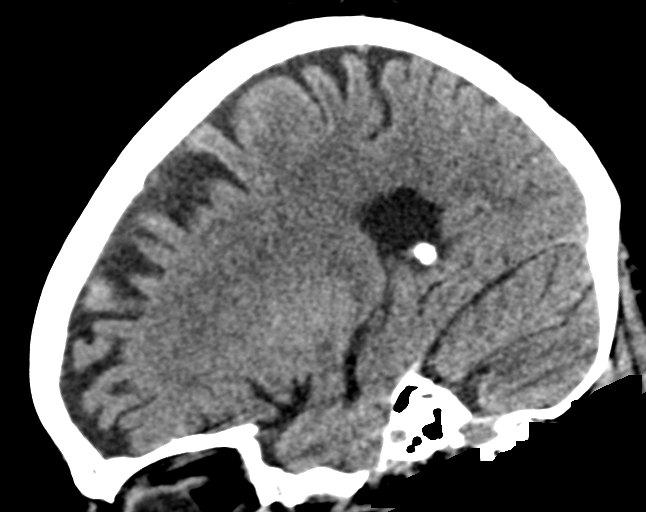
[im 25/50  brain]
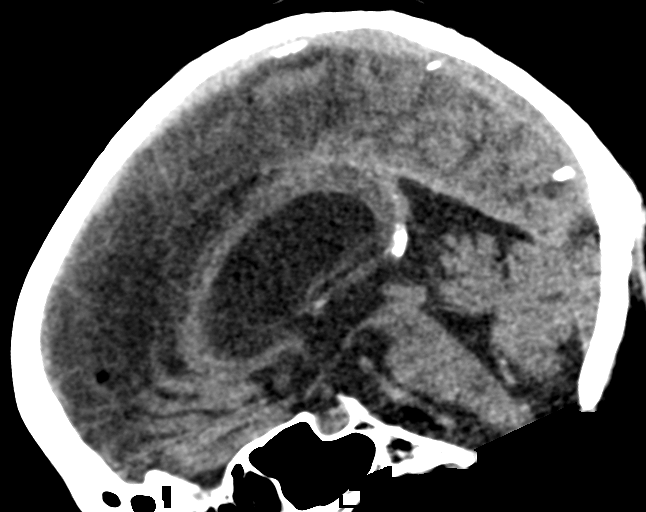
[im 33/50  brain]
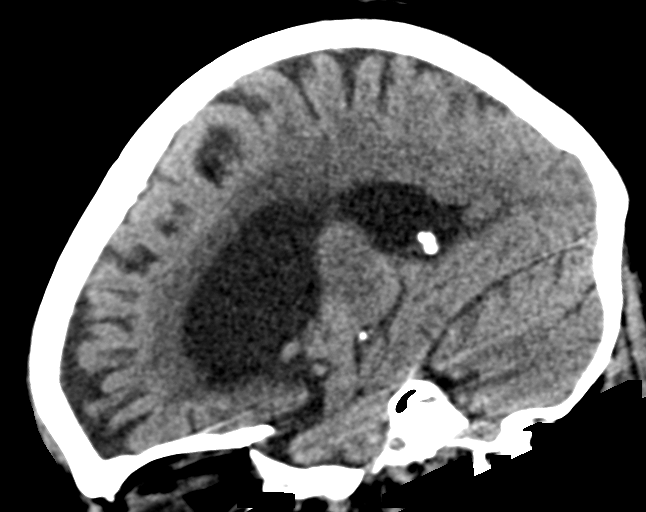

[14 of 47 positions shown; findings below may reference images not displayed]

FINDINGS: Brain: No evidence of acute territorial infarction, hemorrhage,
hydrocephalus,extra-axial collection or mass lesion/mass effect.
There is dilatation the ventricles and sulci consistent with
age-related atrophy. Low-attenuation changes in the deep white
matter consistent with small vessel ischemia. Again noted is a prior
infarct involving the left frontal lobe with ex vacuo dilatation of
the left lateral ventricle. Prior lacunar infarcts cervical within
the bilateral basal ganglia.

Vascular: No hyperdense vessel or unexpected calcification.

Skull: The skull is intact. No fracture or focal lesion identified.

Sinuses/Orbits: The visualized paranasal sinuses and mastoid air
cells are clear. The orbits and globes intact.

Other: There is a small soft tissue hematoma seen overlying the left
frontal skull and periorbital region.

Face:

Osseous: No acute fracture or other significant osseous
abnormality.The nasal bone, mandibles, zygomatic arches and
pterygoid plates are intact.

Orbits: No fracture identified. Unremarkable appearance of globes
and orbits. No retro-orbital collections are seen.

Sinuses: The visualized paranasal sinuses and mastoid air cells are
unremarkable.

Soft tissues: Small soft tissue hematoma seen overlying the left
frontal skull and periorbital region.

Limited intracranial: No acute findings.

Cervical spine:

Alignment: There is straightening of the normal cervical lordosis.

Skull base and vertebrae: Visualized skull base is intact. No
atlanto-occipital dissociation. The vertebral body heights are well
maintained. No fracture or pathologic osseous lesion seen.

Soft tissues and spinal canal: The visualized paraspinal soft
tissues are unremarkable. No prevertebral soft tissue swelling is
seen. The spinal canal is grossly unremarkable, no large epidural
collection or significant canal narrowing.

Disc levels: Disc height loss with uncovertebral osteophytes and
disc osteophyte complex is most notable at C5-C6 and C6-C7 with mild
neural foraminal and canal narrowing.

Upper chest: The lung apices are clear. Thoracic inlet is within
normal limits.

Other: Scattered carotid artery calcifications are seen. Aortic
atherosclerosis is noted. For which
IMPRESSION: No acute intracranial abnormality.

Findings consistent with age related atrophy and chronic small
vessel ischemia

Prior lacunar infarcts as described above

Soft tissue hematoma and swelling over the left frontal skull and
periorbital region

No acute facial fracture

No acute fracture or malalignment of the spine.

## 2021-09-07 IMAGING — CR DG TOE GREAT 2+V*R*
1 series · 3 of 3 positions shown · non-contrast
Comparison: None.

CLINICAL DATA: Status post fall.

EXAM:
RIGHT GREAT TOE

[Series 1: dg toe great right · 0.14mm/px · 3 of 3 slices shown]
[im 1/3]
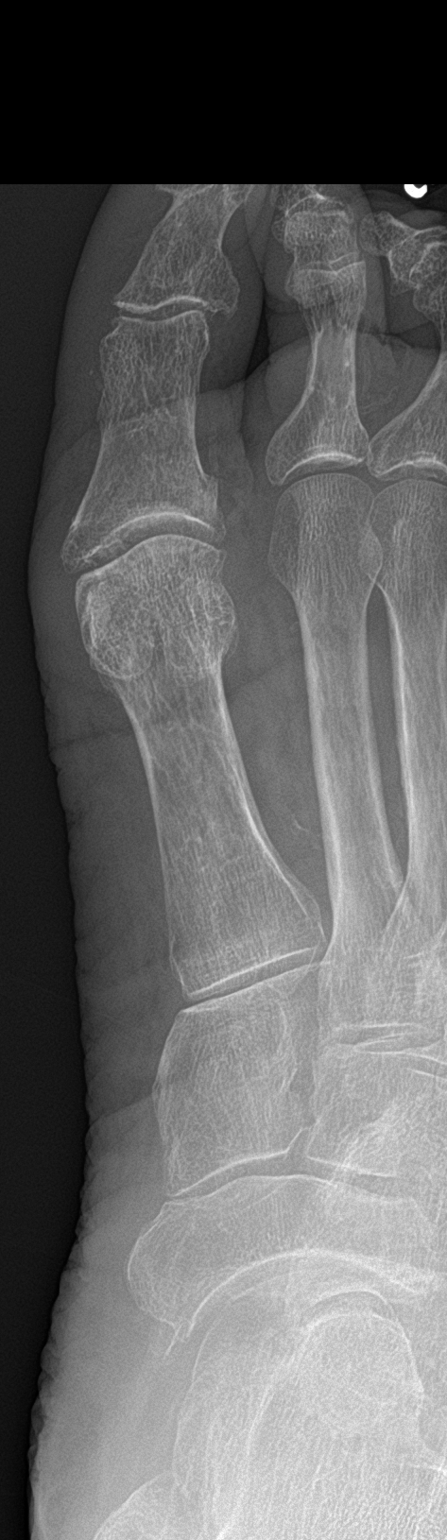
[im 2/3]
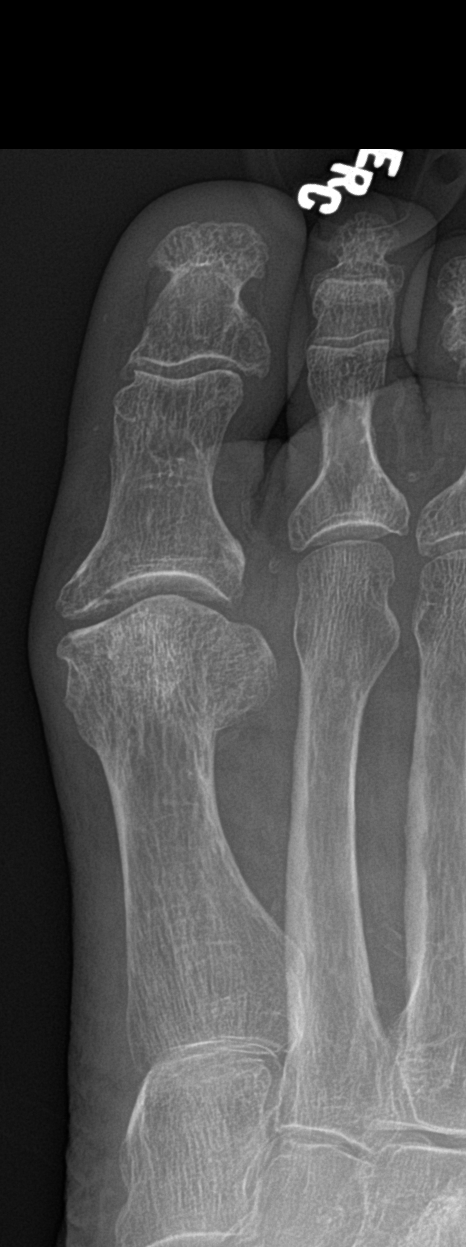
[im 3/3]
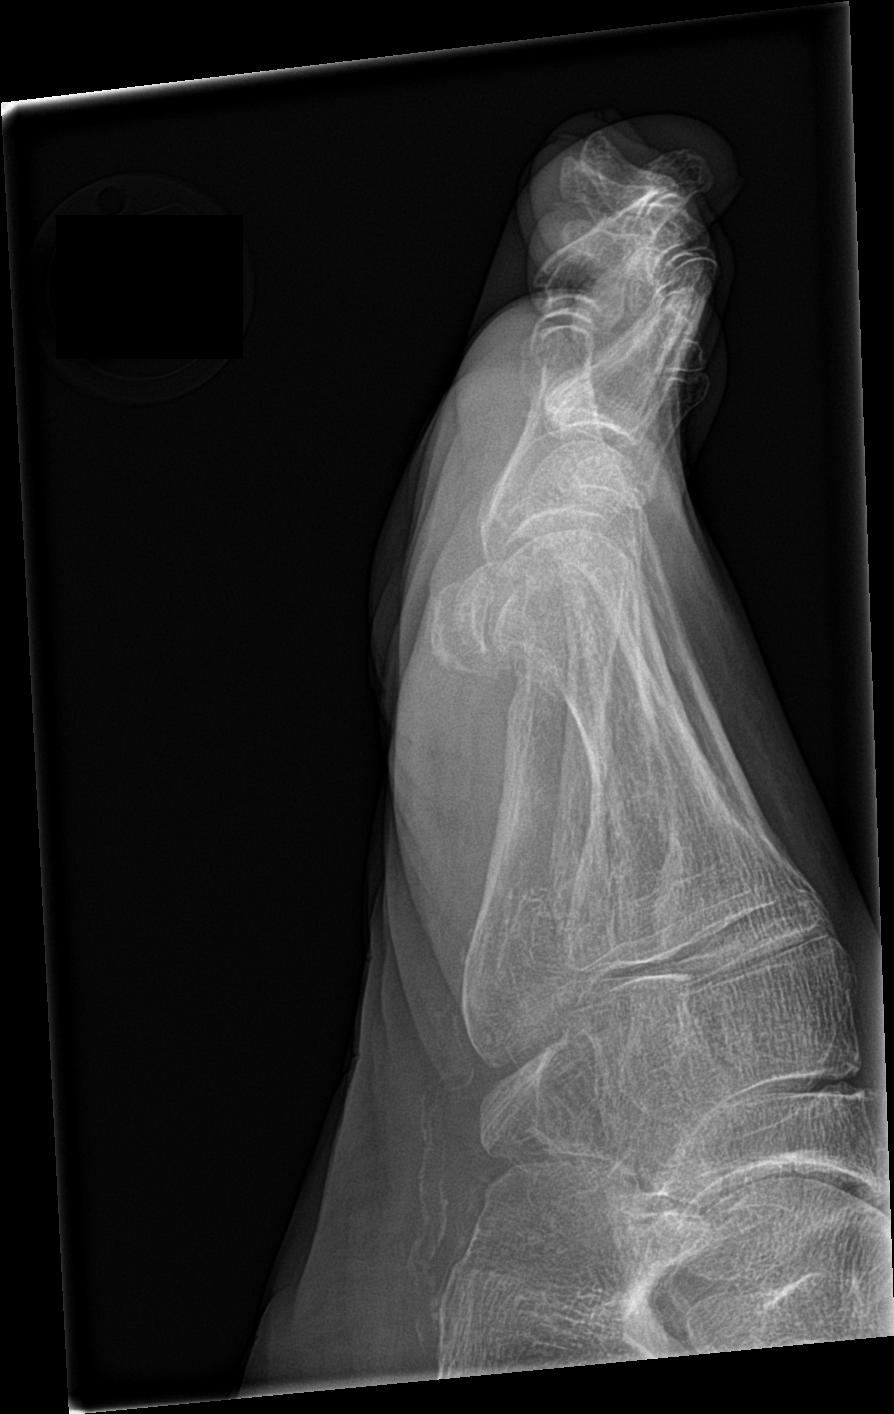

[3 of 3 positions shown; findings below may reference images not displayed]

FINDINGS: A nondisplaced fracture deformity is seen extending through the
distal phalanx of the right great toe. There is no evidence of
dislocation. There is no evidence of arthropathy or other focal bone
abnormality. Mild diffuse soft tissue swelling is seen.
IMPRESSION: Nondisplaced fracture of the distal phalanx of the right great toe.

## 2021-09-07 IMAGING — CR DG WRIST COMPLETE 3+V*L*
1 series · 4 of 4 positions shown · non-contrast
Comparison: February 25, 2007

CLINICAL DATA: Status post fall.

EXAM:
LEFT WRIST - COMPLETE 3+ VIEW

[Series 1: dg wrist complete left · 0.14mm/px · 4 of 4 slices shown]
[im 1/4]
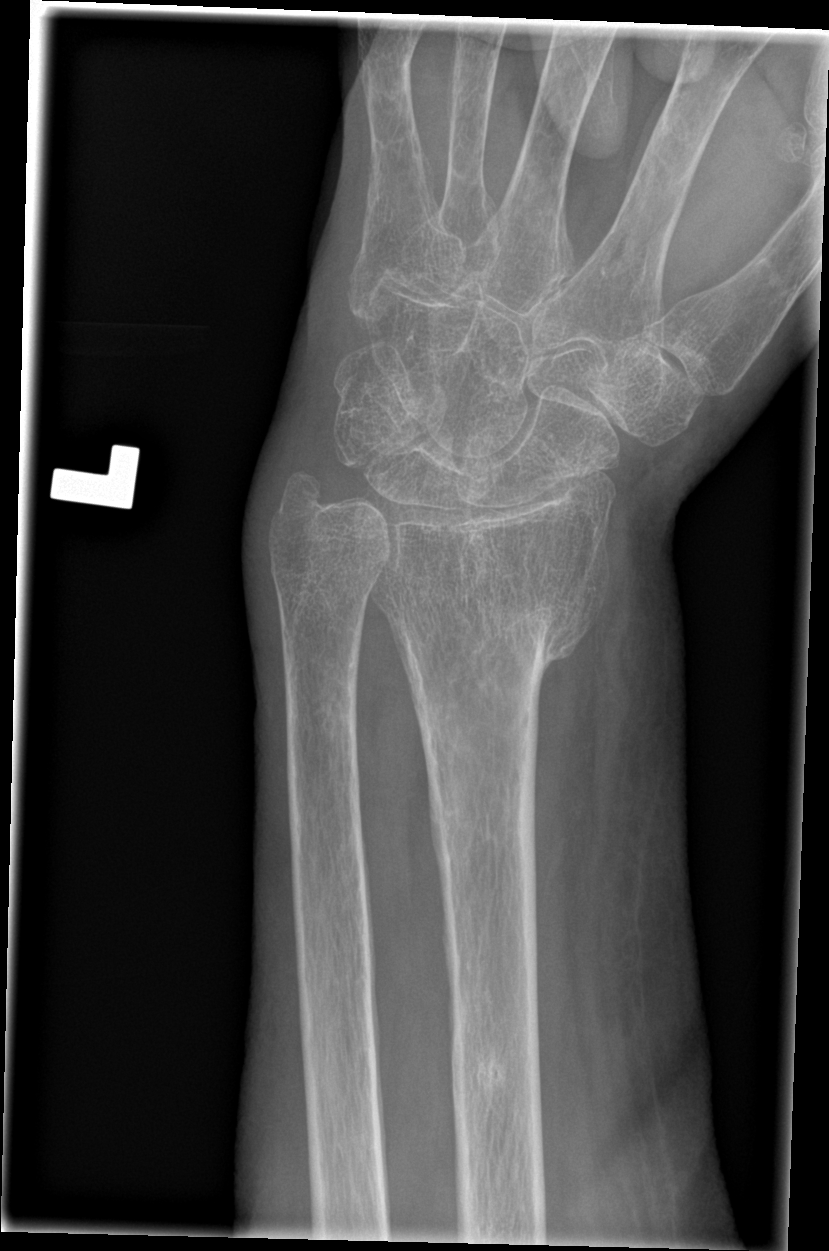
[im 2/4]
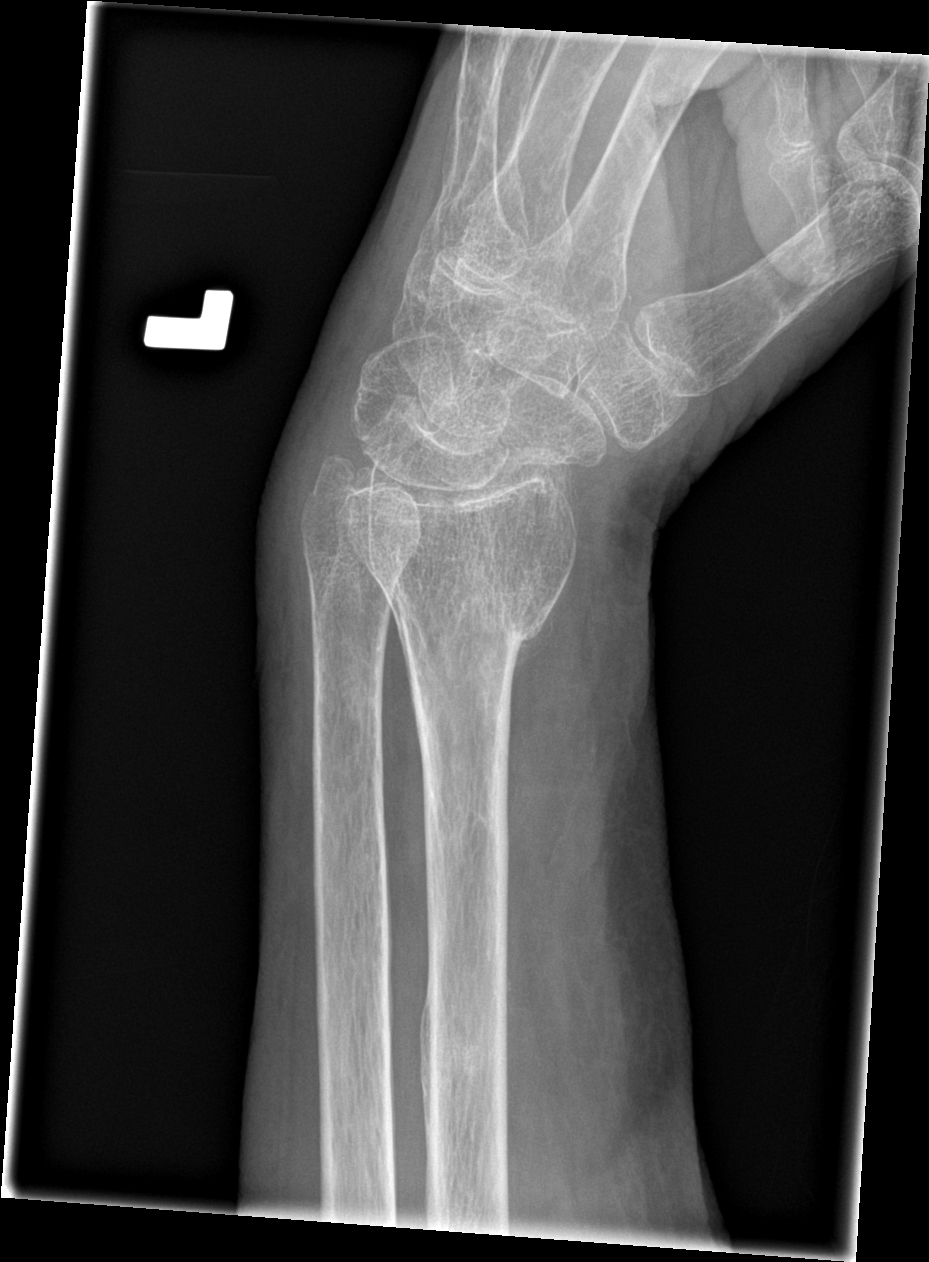
[im 3/4]
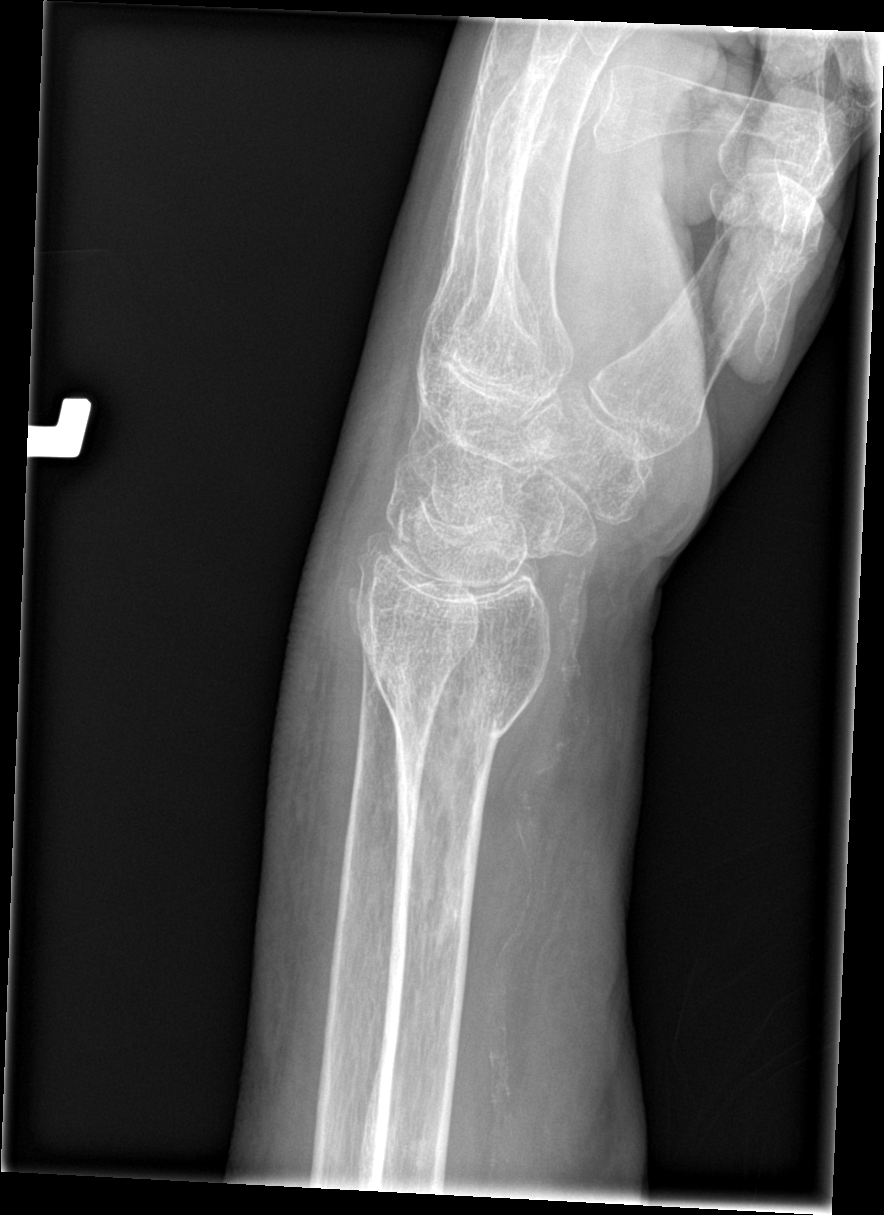
[im 4/4]
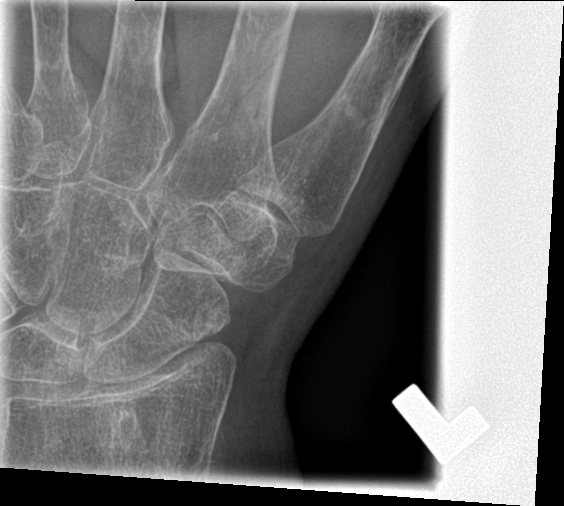

[4 of 4 positions shown; findings below may reference images not displayed]

FINDINGS: A small fracture of the left ulnar styloid is seen. A chronic
fracture deformity is seen involving the distal left radius. This is
present on the prior study. An ill-defined area of cortical
irregularity is seen involving the proximal aspect of the second
left metacarpal. Mild to moderate severity diffuse soft tissue
swelling is seen.
IMPRESSION: 1. Small fracture of the left ulnar styloid.
2. Additional findings suspicious for a nondisplaced fracture of the
second left metacarpal.

## 2021-09-07 IMAGING — CT CT CERVICAL SPINE W/O CM
2 of 4 series · 9 of 35 positions shown, 11 images · non-contrast
Comparison: September 25, 2018

CLINICAL DATA: Fall after hitting her left side of the face

EXAM:
CT HEAD WITHOUT CONTRAST
TECHNIQUE: Contiguous axial images were obtained from the base of the skull
through the vertex without intravenous contrast.

[Series 6: coronal bone · coronal · 0.29mm/px · 3 of 49 slices shown]
[im 10/49  bone]
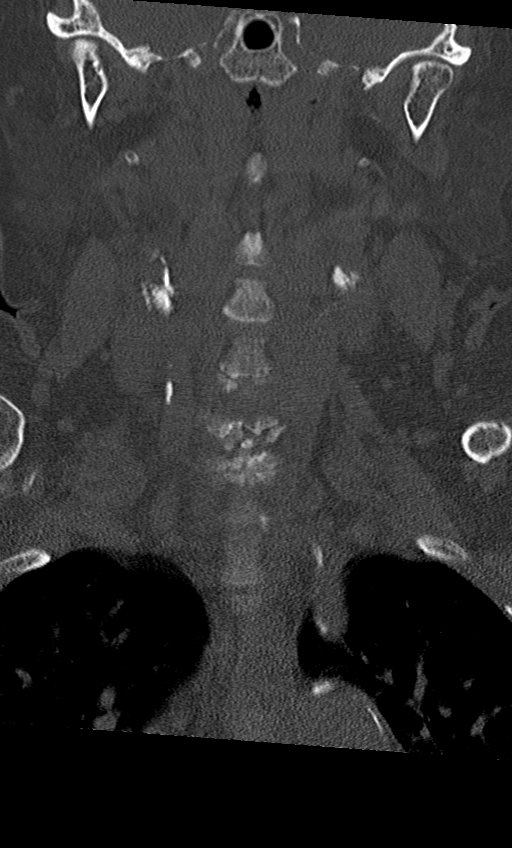
[im 20/49  bone]
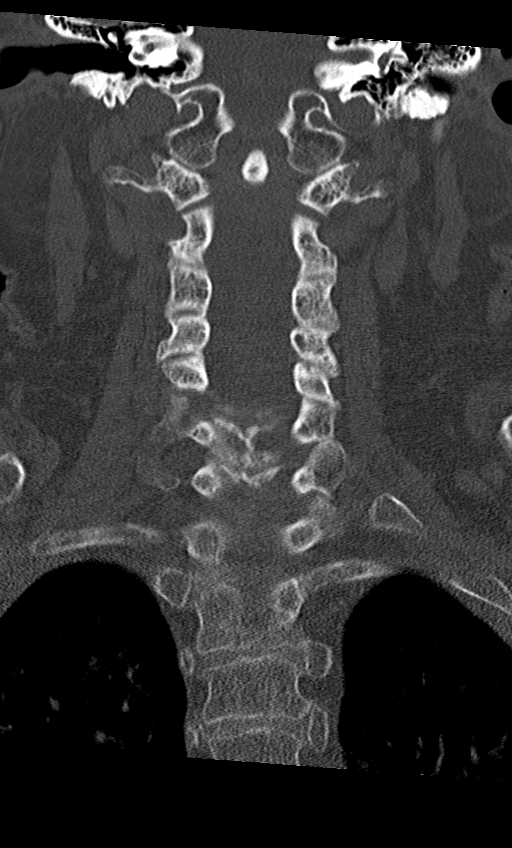
[im 29/49  bone]
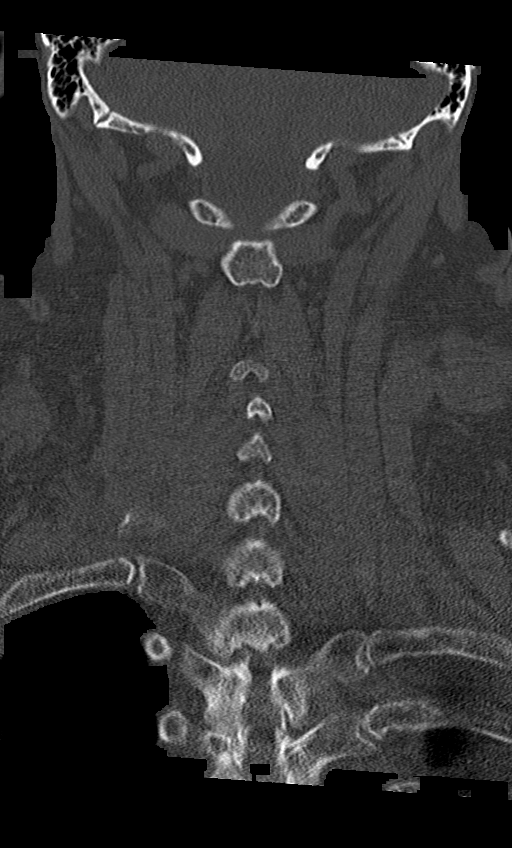

[Series 7: orthogonal axials · axial · 0.19mm/px · z∈[-354,-188]mm · 6 of 123 slices shown, 8 images]
[im 18/123  soft-tissue]
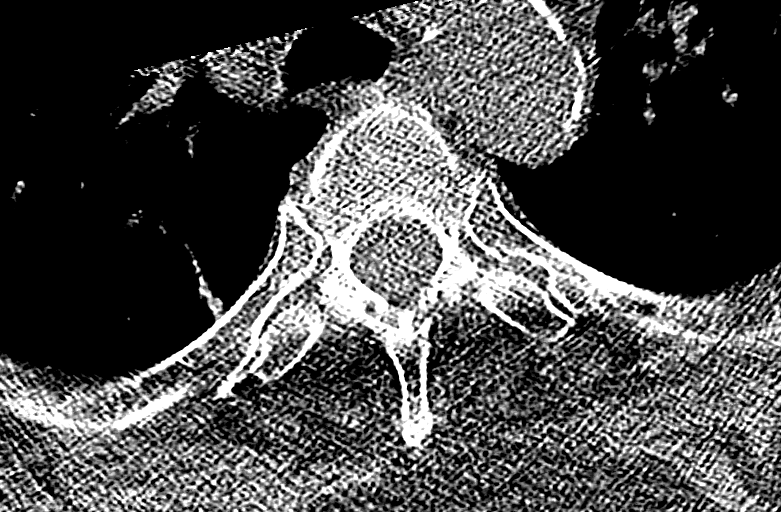
[im 18/123  bone]
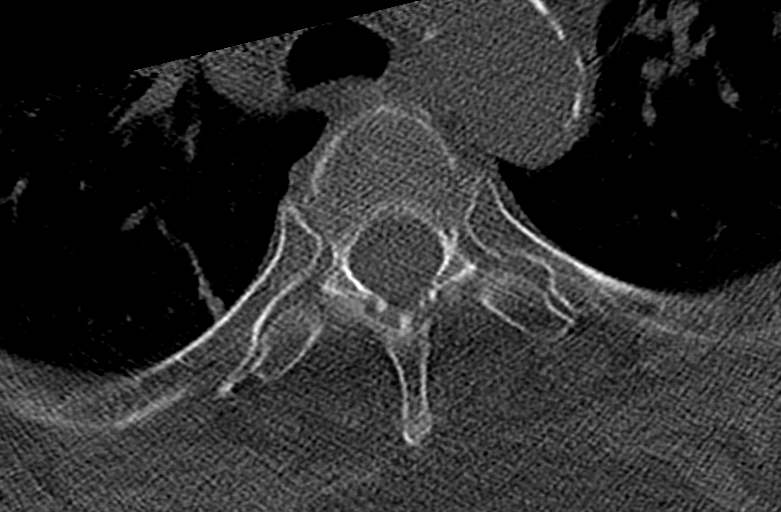
[im 35/123  bone]
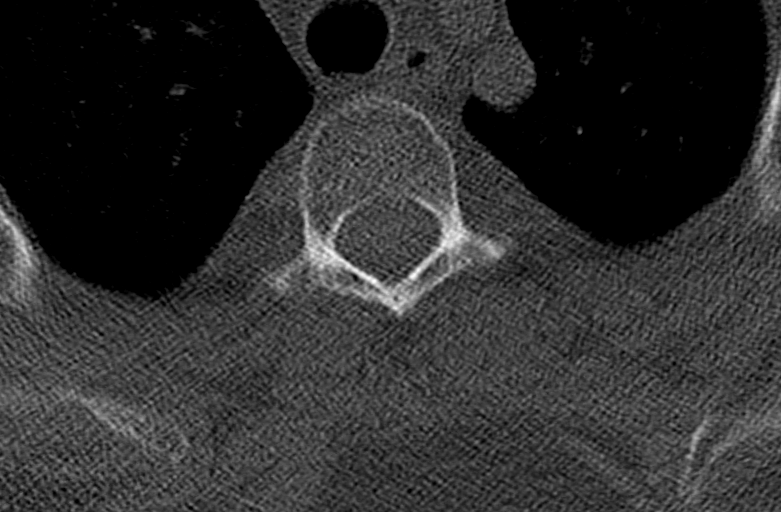
[im 53/123  bone]
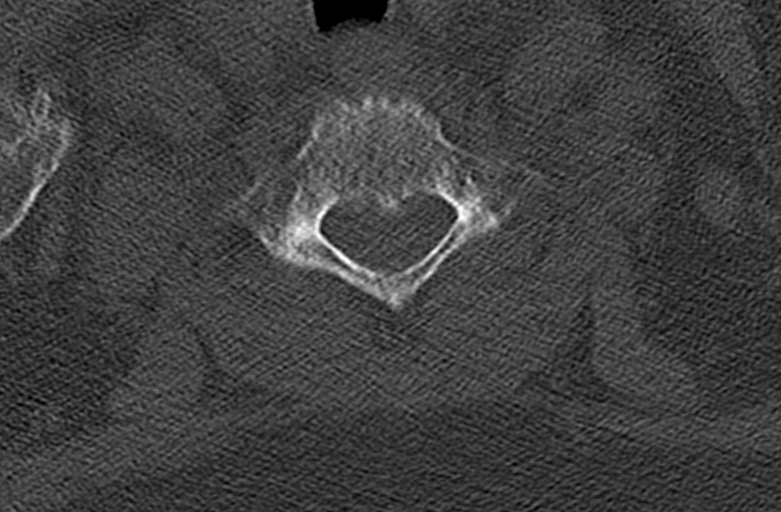
[im 70/123  bone]
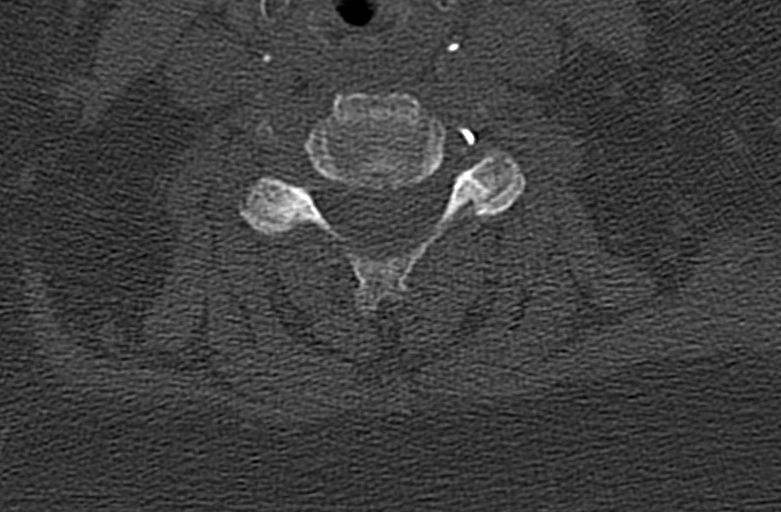
[im 88/123  soft-tissue]
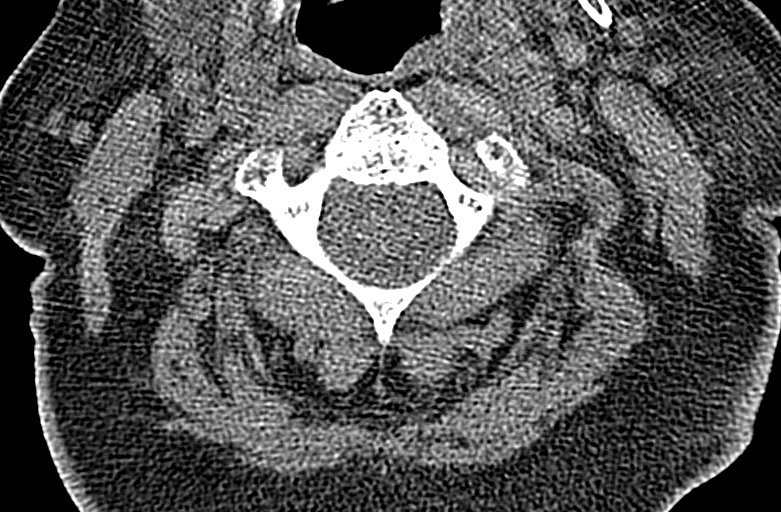
[im 88/123  bone]
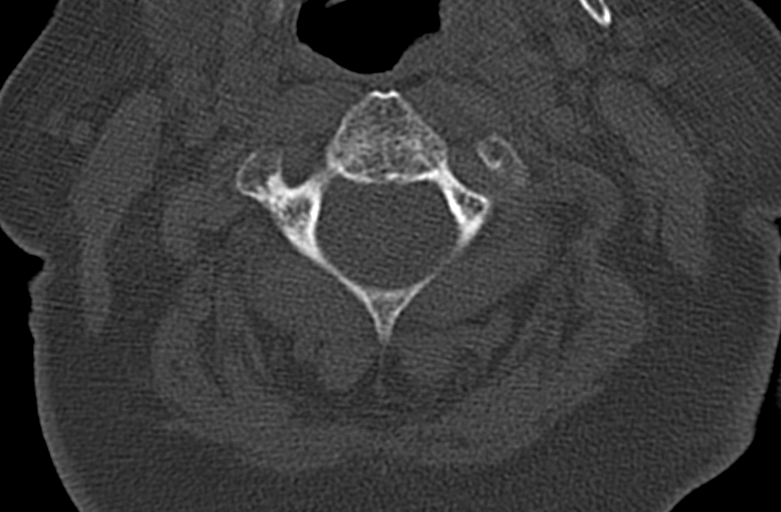
[im 105/123  bone]
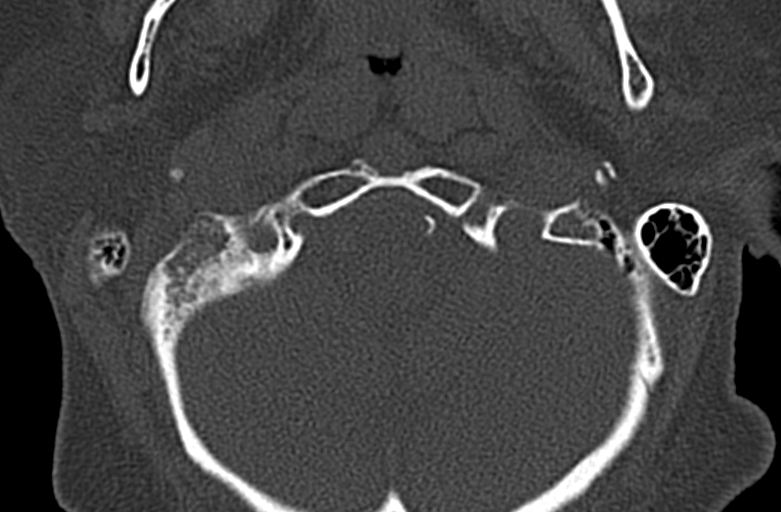

[9 of 35 positions shown; findings below may reference images not displayed]

FINDINGS: Brain: No evidence of acute territorial infarction, hemorrhage,
hydrocephalus,extra-axial collection or mass lesion/mass effect.
There is dilatation the ventricles and sulci consistent with
age-related atrophy. Low-attenuation changes in the deep white
matter consistent with small vessel ischemia. Again noted is a prior
infarct involving the left frontal lobe with ex vacuo dilatation of
the left lateral ventricle. Prior lacunar infarcts cervical within
the bilateral basal ganglia.

Vascular: No hyperdense vessel or unexpected calcification.

Skull: The skull is intact. No fracture or focal lesion identified.

Sinuses/Orbits: The visualized paranasal sinuses and mastoid air
cells are clear. The orbits and globes intact.

Other: There is a small soft tissue hematoma seen overlying the left
frontal skull and periorbital region.

Face:

Osseous: No acute fracture or other significant osseous
abnormality.The nasal bone, mandibles, zygomatic arches and
pterygoid plates are intact.

Orbits: No fracture identified. Unremarkable appearance of globes
and orbits. No retro-orbital collections are seen.

Sinuses: The visualized paranasal sinuses and mastoid air cells are
unremarkable.

Soft tissues: Small soft tissue hematoma seen overlying the left
frontal skull and periorbital region.

Limited intracranial: No acute findings.

Cervical spine:

Alignment: There is straightening of the normal cervical lordosis.

Skull base and vertebrae: Visualized skull base is intact. No
atlanto-occipital dissociation. The vertebral body heights are well
maintained. No fracture or pathologic osseous lesion seen.

Soft tissues and spinal canal: The visualized paraspinal soft
tissues are unremarkable. No prevertebral soft tissue swelling is
seen. The spinal canal is grossly unremarkable, no large epidural
collection or significant canal narrowing.

Disc levels: Disc height loss with uncovertebral osteophytes and
disc osteophyte complex is most notable at C5-C6 and C6-C7 with mild
neural foraminal and canal narrowing.

Upper chest: The lung apices are clear. Thoracic inlet is within
normal limits.

Other: Scattered carotid artery calcifications are seen. Aortic
atherosclerosis is noted. For which
IMPRESSION: No acute intracranial abnormality.

Findings consistent with age related atrophy and chronic small
vessel ischemia

Prior lacunar infarcts as described above

Soft tissue hematoma and swelling over the left frontal skull and
periorbital region

No acute facial fracture

No acute fracture or malalignment of the spine.

## 2021-09-08 IMAGING — CT CT CHEST W/O CM
2 of 4 series · 15 of 36 positions shown, 18 images · non-contrast
Comparison: November 29, 2018

CLINICAL DATA: Status post trauma.

EXAM:
CT CHEST WITHOUT CONTRAST
TECHNIQUE: Multidetector CT imaging of the chest was performed following the
standard protocol without IV contrast.

[Series 2: thorax · axial · 0.84mm/px · z∈[-761,-483]mm · 12 of 163 slices shown, 15 images]
[im 12/163  mediastinal]
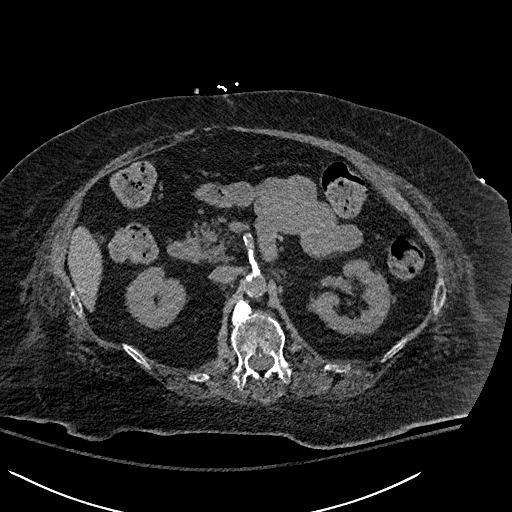
[im 12/163  lung]
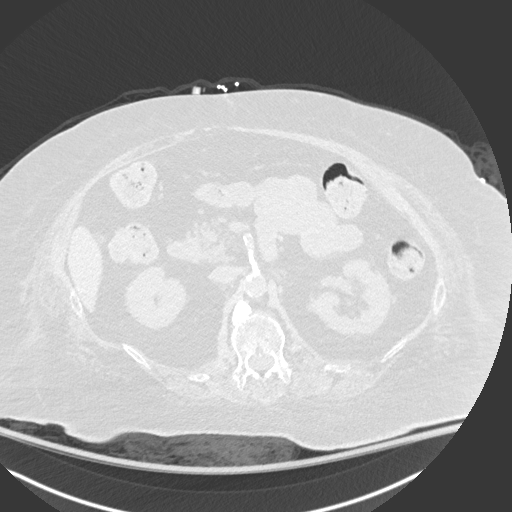
[im 24/163  lung]
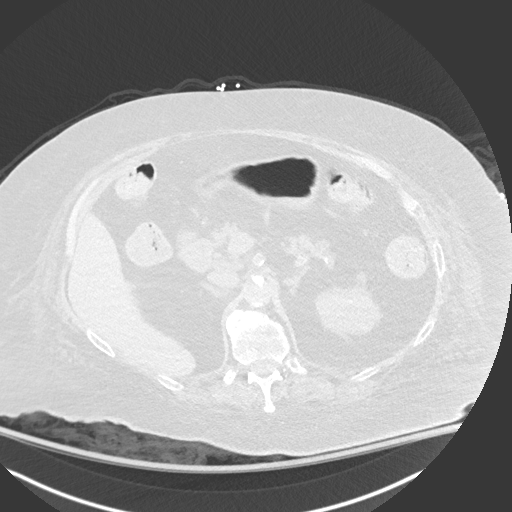
[im 35/163  lung]
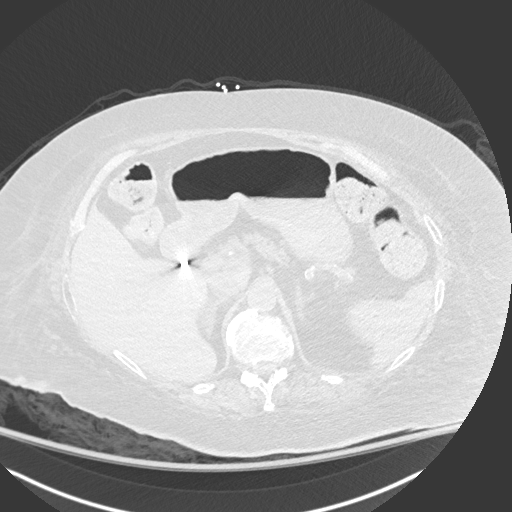
[im 47/163  lung]
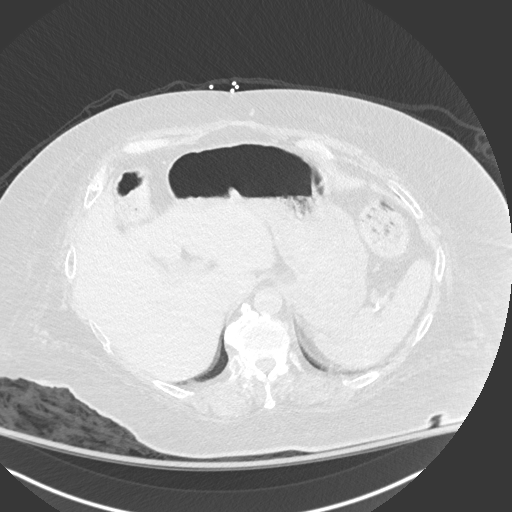
[im 58/163  mediastinal]
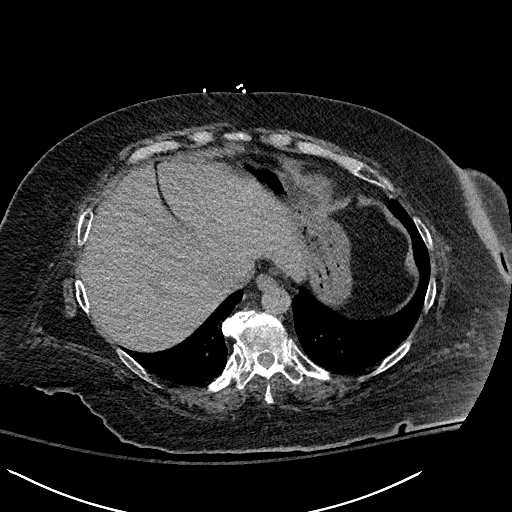
[im 58/163  lung]
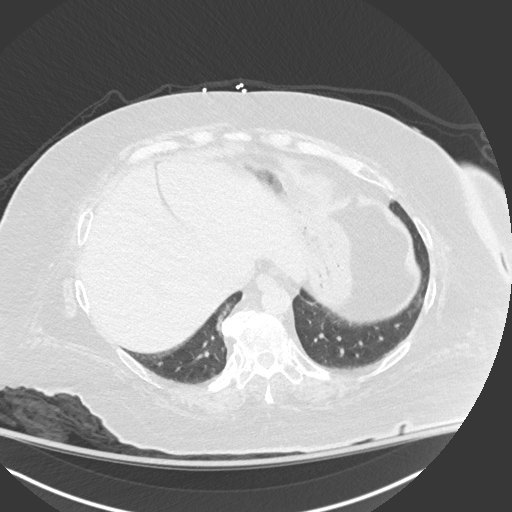
[im 70/163  lung]
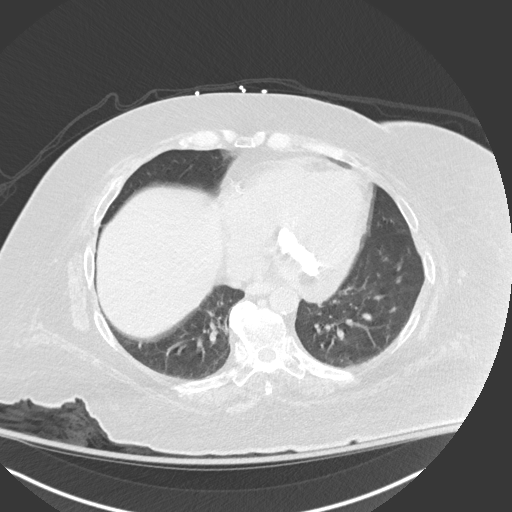
[im 93/163  lung]
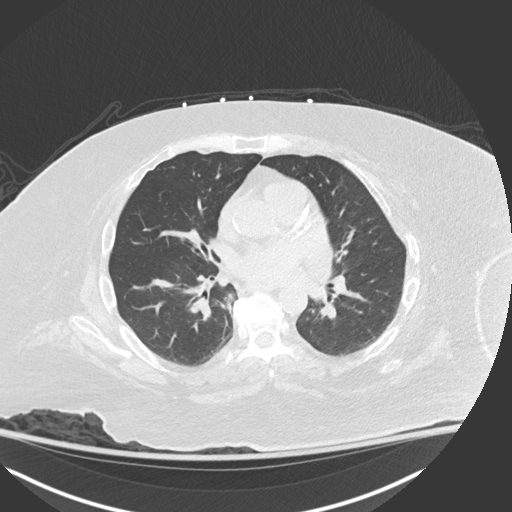
[im 105/163  lung]
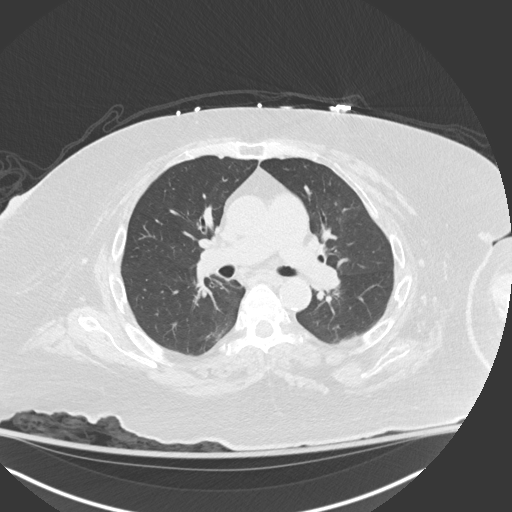
[im 116/163  mediastinal]
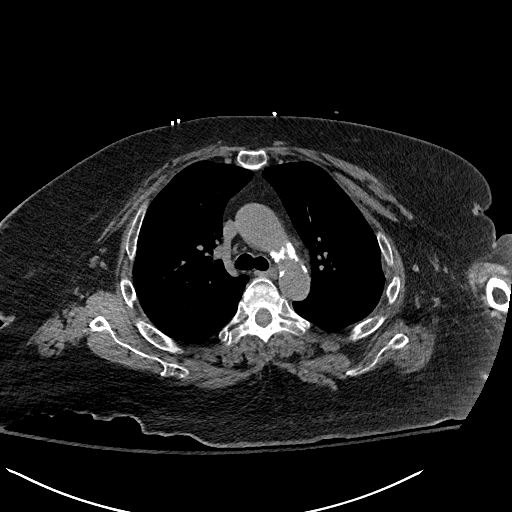
[im 116/163  lung]
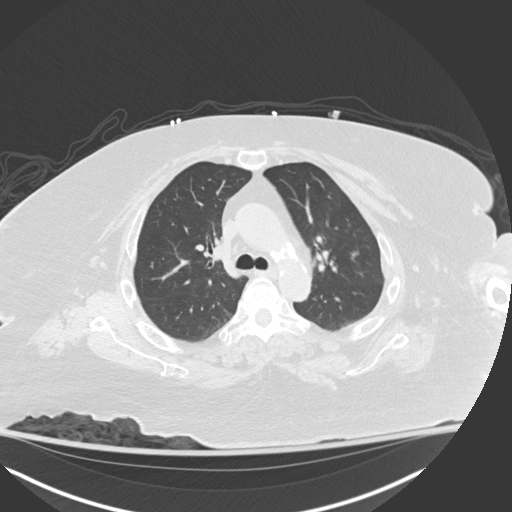
[im 128/163  lung]
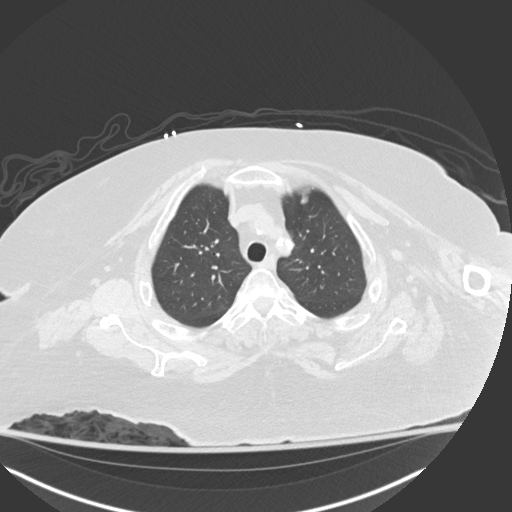
[im 139/163  lung]
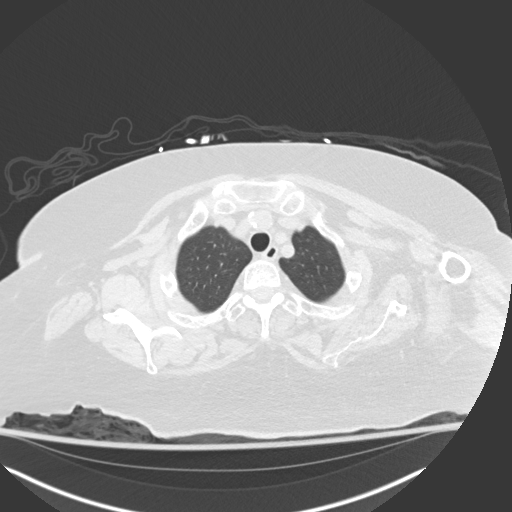
[im 151/163  lung]
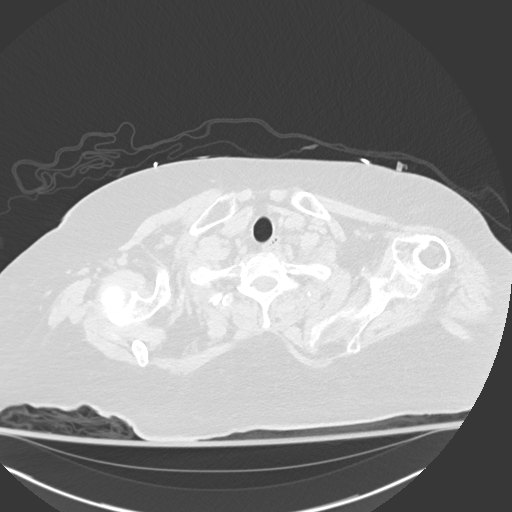

[Series 5: coronal · coronal · 0.65mm/px · 3 of 135 slices shown]
[im 27/135  lung]
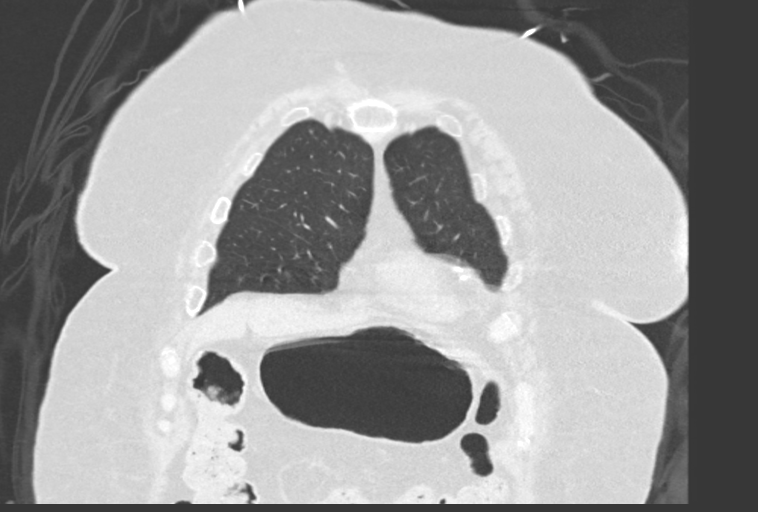
[im 54/135  lung]
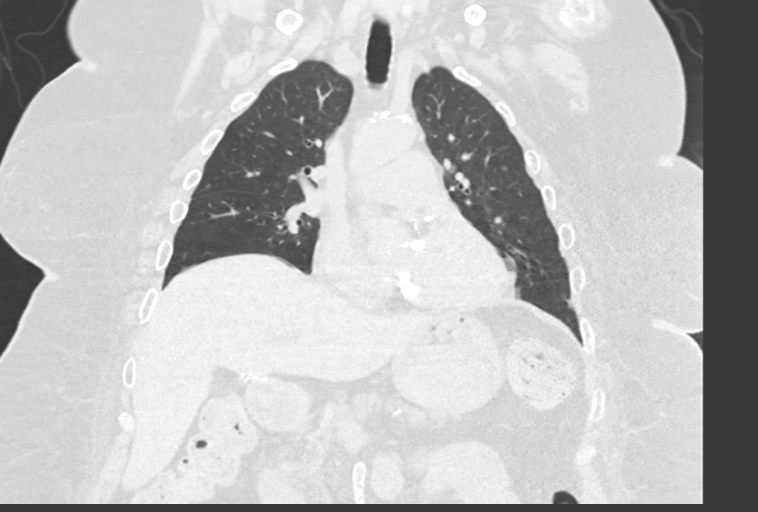
[im 81/135  lung]
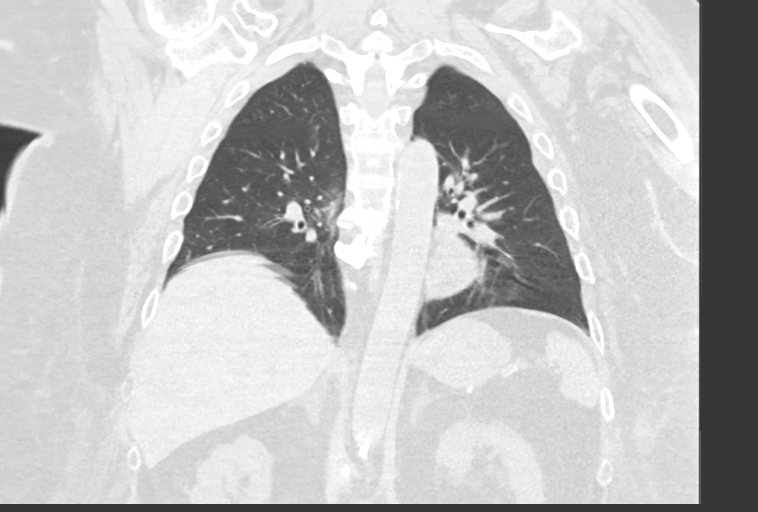

[15 of 36 positions shown; findings below may reference images not displayed]

FINDINGS: Cardiovascular: There is marked severity calcification of the
thoracic aorta. Normal heart size. A stable small to moderate sized
posterior pericardial effusion is seen. There is marked severity
coronary artery calcification.

Mediastinum/Nodes: No enlarged mediastinal or axillary lymph nodes.
Thyroid gland, trachea, and esophagus demonstrate no significant
findings.

Lungs/Pleura: Lungs are clear. No pleural effusion or pneumothorax.

Upper Abdomen: Multiple surgical clips are seen within the
gallbladder fossa.

Musculoskeletal: Acute, nondisplaced third through ninth lateral
left rib fractures are seen.

A chronic deformity is seen along the distal aspect of the right
clavicle.
IMPRESSION: 1. Acute, nondisplaced third through ninth lateral left rib
fractures.
2. Stable small to moderate sized posterior pericardial effusion.
3. Marked severity coronary artery calcification.

Aortic Atherosclerosis ([id number]).

## 2021-10-21 IMAGING — MR MR HEAD W/O CM
10 of 11 series · 37 of 48 positions shown · non-contrast
Comparison: Noncontrast head CT performed earlier the same day
12/23/2019, brain MRI 09/25/2018
COMPARISON: Noncontrast head CT performed earlier the same day
12/23/2019, brain MRI 09/25/2018

Addendum:
CLINICAL DATA: Focal neuro deficit, greater than 6 hours, stroke
suspected. Additional history provided: Left-sided weakness and
difficulty finding words.

EXAM:
MRI HEAD WITHOUT CONTRAST
TECHNIQUE: Multiplanar, multiecho pulse sequences of the brain and surrounding
structures were obtained without intravenous contrast.

[Series 5: ax dwi_tracew · axial · 3.0mm · 0.60mm/px · z∈[-137,+17]mm · 4 of 48 slices shown]
[im 1/48]
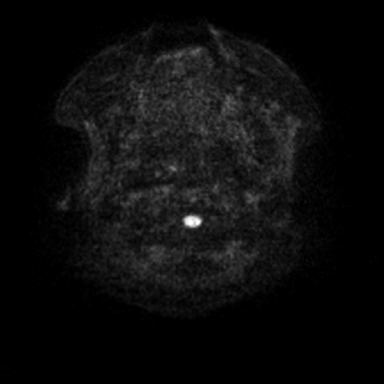
[im 16/48]
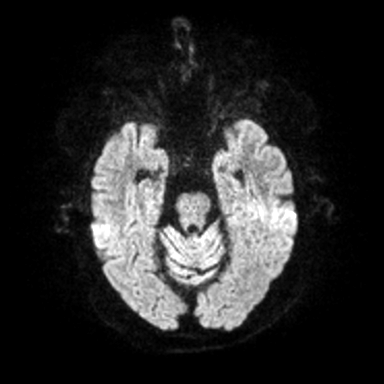
[im 32/48]
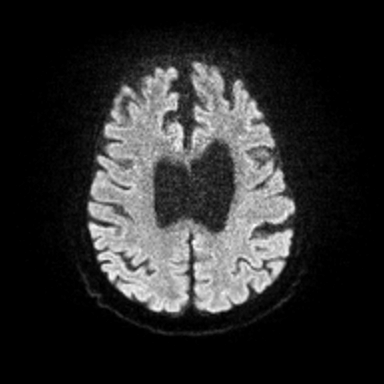
[im 48/48]
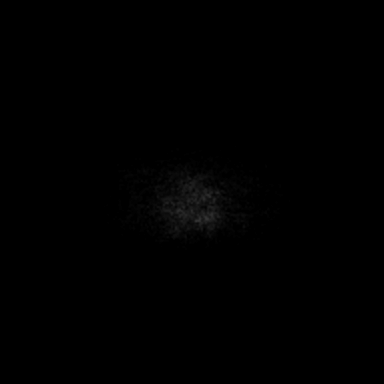

[Series 6: ax dwi_adc · axial · 3.0mm · 0.60mm/px · z∈[-137,+17]mm · 4 of 48 slices shown]
[im 1/48]
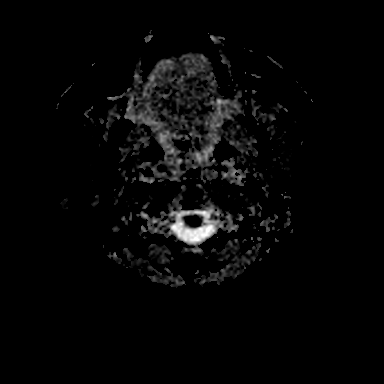
[im 16/48]
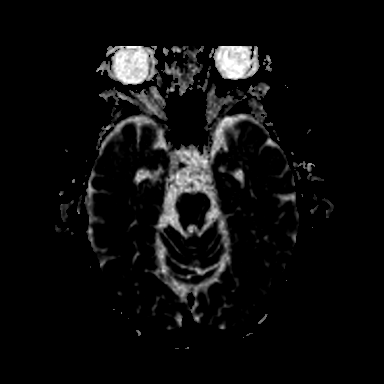
[im 32/48]
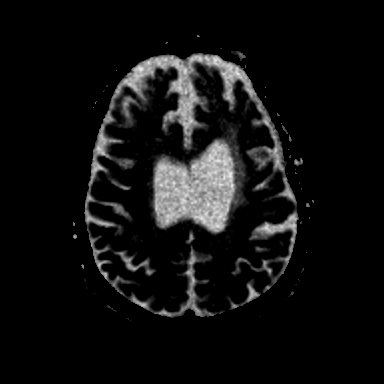
[im 48/48]
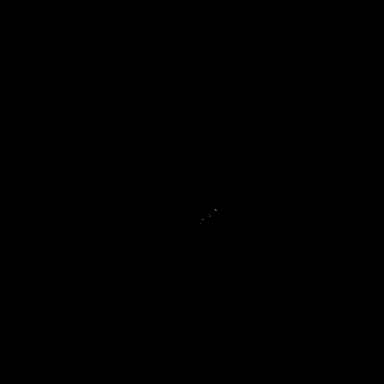

[Series 7: cor dwi_tracew · coronal · 5.0mm · 0.60mm/px · 3 of 40 slices shown]
[im 1/40]
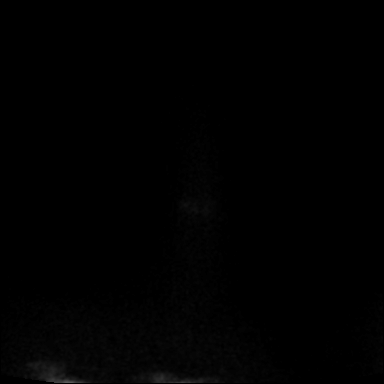
[im 20/40]
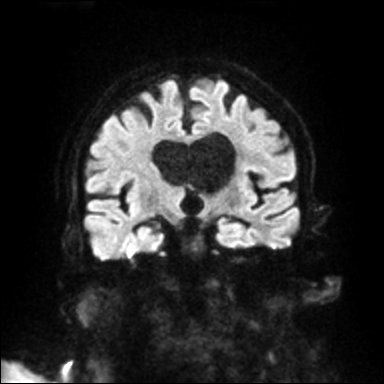
[im 40/40]
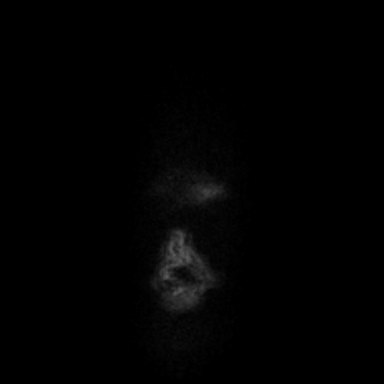

[Series 8: cor dwi_adc · coronal · 5.0mm · 0.60mm/px · 3 of 38 slices shown]
[im 1/38]
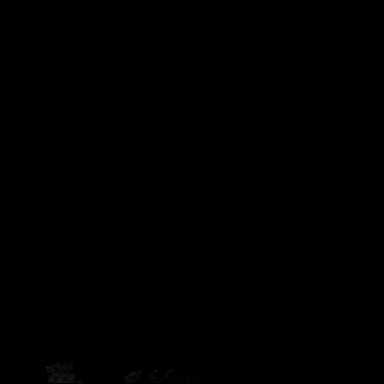
[im 19/38]
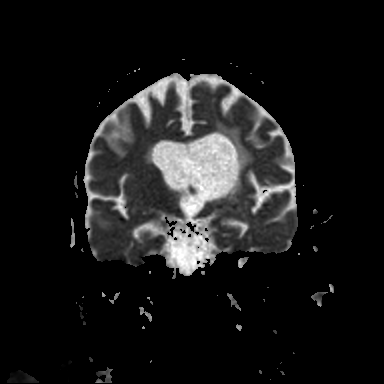
[im 38/38]
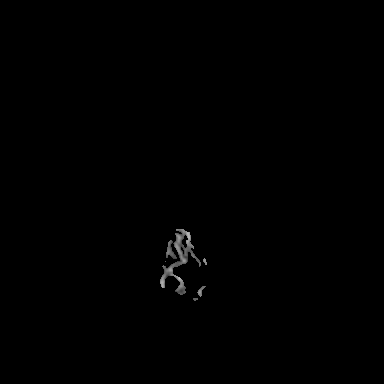

[Series 9: T1 · sagittal · 5.0mm · 0.62mm/px · 2 of 25 slices shown (1 of 2)]
[im 1/25]
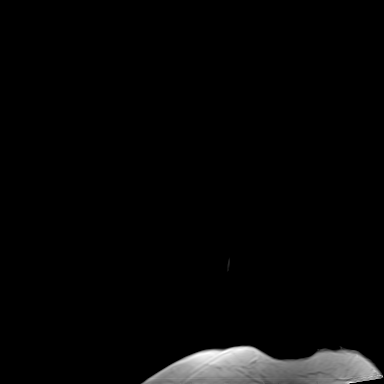
[im 25/25]
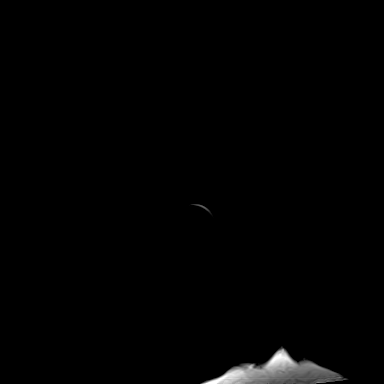

[Series 10: T2 · axial · 5.0mm · 0.53mm/px · z∈[-134,+15]mm · 2 of 26 slices shown (1 of 2)]
[im 1/26]
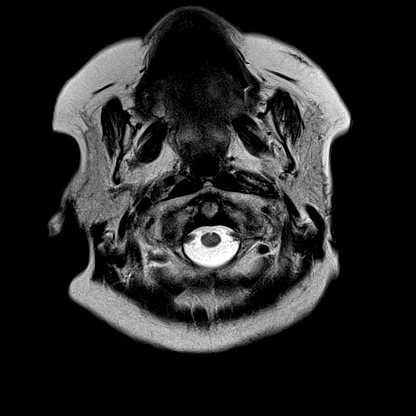
[im 26/26]
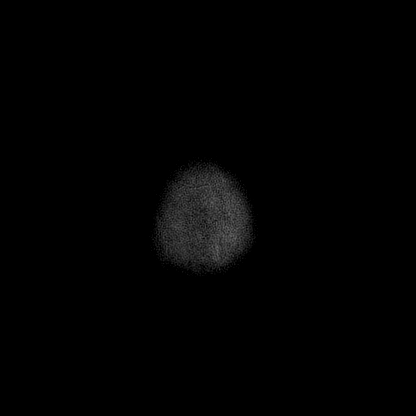

[Series 12: pha_images · axial · 3.0mm · 0.90mm/px · z∈[-147,+29]mm · 5 of 59 slices shown]
[im 1/59]
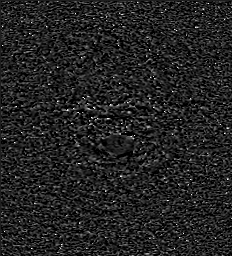
[im 15/59]
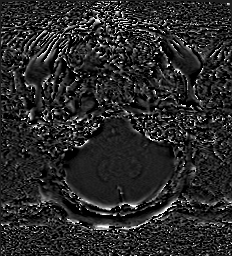
[im 30/59]
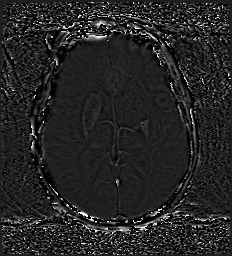
[im 44/59]
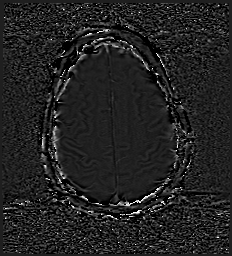
[im 59/59]
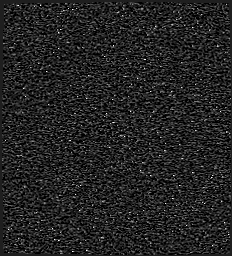

[Series 15: FLAIR · axial · 3.0mm · 0.53mm/px · z∈[-140,+21]mm · 4 of 55 slices shown]
[im 1/55]
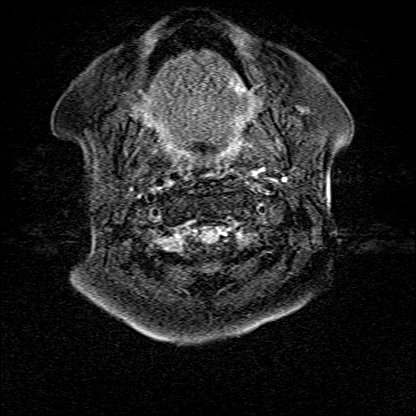
[im 19/55]
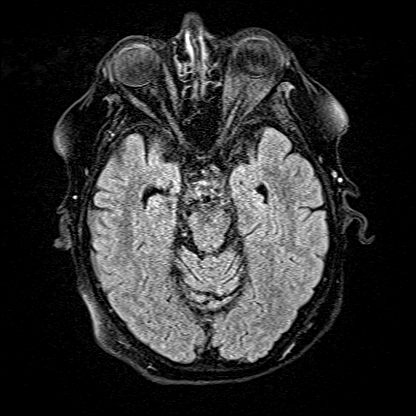
[im 37/55]
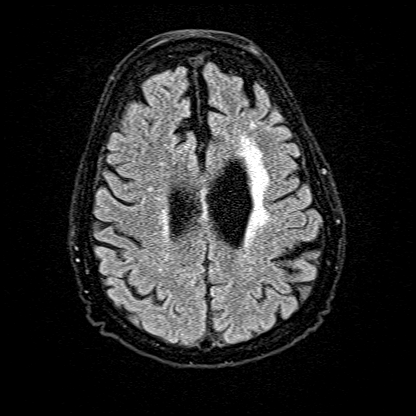
[im 55/55]
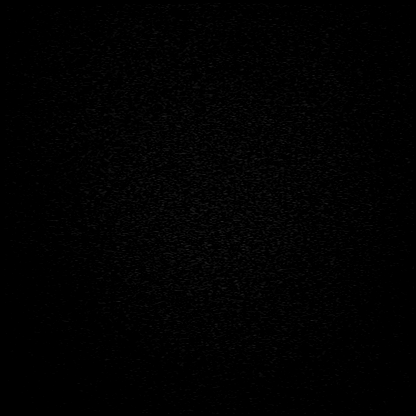

[Series 16: T1 · axial · 1.0mm · 0.98mm/px · z∈[-147,+27]mm · 8 of 176 slices shown (2 of 2)]
[im 1/176]
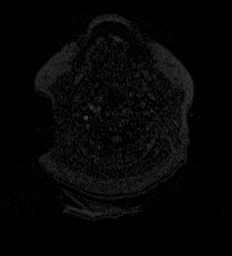
[im 27/176]
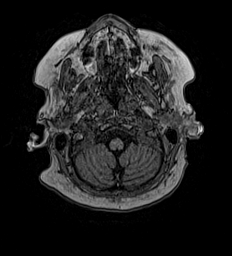
[im 54/176]
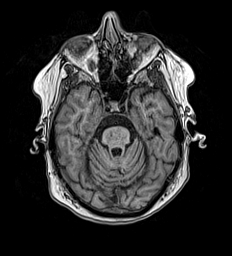
[im 81/176]
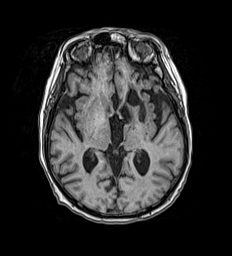
[im 95/176]
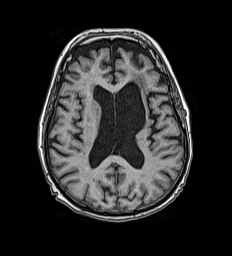
[im 122/176]
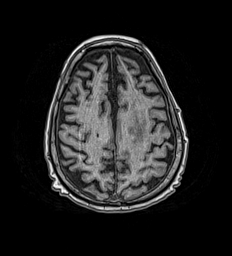
[im 149/176]
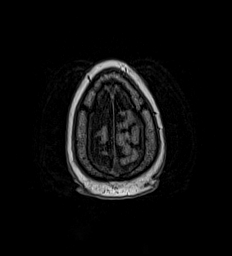
[im 176/176]
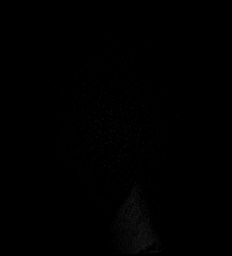

[Series 17: T2 · coronal · 5.0mm · 0.57mm/px · 2 of 29 slices shown (2 of 2)]
[im 1/29]
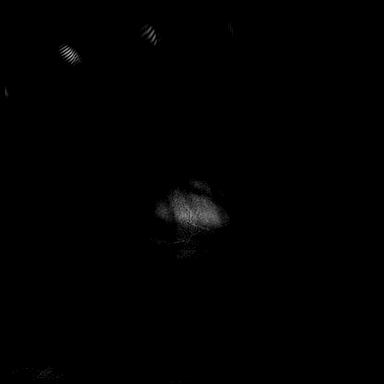
[im 29/29]
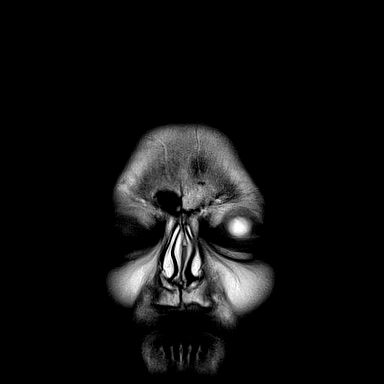

[37 of 48 positions shown; findings below may reference images not displayed]

FINDINGS: Brain:

Stable, moderate generalized parenchymal atrophy.

Stable chronic small vessel ischemic changes within the cerebral
white matter.

Again demonstrated is a large chronic infarct affecting the left
basal ganglia and radiating white matter tracts. Chronic blood
products at this site. Associated ex vacuo dilatation of the left
lateral ventricle.

Redemonstrated chronic lacunar infarct within the right thalamus
capsular junction. Also again seen, chronic small vessel ischemic
changes and wallerian degeneration affect the pons.

There is no acute infarct.

No evidence of intracranial mass.

No extra-axial fluid collection.

No midline shift.

Vascular: Expected proximal arterial flow voids.

Skull and upper cervical spine: No focal marrow lesion.

Sinuses/Orbits: Visualized orbits show no acute finding. Mild
ethmoid and maxillary sinus mucosal thickening. No significant
mastoid effusion.
IMPRESSION: No evidence of acute intracranial abnormality, including acute
infarction.

Stable generalized parenchymal atrophy and chronic small vessel
ischemic disease. Redemonstrated extensive chronic infarct affecting
the left basal ganglia and radiating white matter tracts. Unchanged
chronic lacunar infarct within the right thalamocapsular junction.
Chronic small-vessel ischemic changes and wallerian degeneration of
the pons.

Mild paranasal sinus mucosal thickening.

ADDENDUM:
Redemonstrated small cortically based infarcts within the posterior
right frontal lobe and anterolateral left frontal lobe.

*** End of Addendum ***
FINDINGS: Brain:

Stable, moderate generalized parenchymal atrophy.

Stable chronic small vessel ischemic changes within the cerebral
white matter.

Again demonstrated is a large chronic infarct affecting the left
basal ganglia and radiating white matter tracts. Chronic blood
products at this site. Associated ex vacuo dilatation of the left
lateral ventricle.

Redemonstrated chronic lacunar infarct within the right thalamus
capsular junction. Also again seen, chronic small vessel ischemic
changes and wallerian degeneration affect the pons.

There is no acute infarct.

No evidence of intracranial mass.

No extra-axial fluid collection.

No midline shift.

Vascular: Expected proximal arterial flow voids.

Skull and upper cervical spine: No focal marrow lesion.

Sinuses/Orbits: Visualized orbits show no acute finding. Mild
ethmoid and maxillary sinus mucosal thickening. No significant
mastoid effusion.
IMPRESSION: No evidence of acute intracranial abnormality, including acute
infarction.

Stable generalized parenchymal atrophy and chronic small vessel
ischemic disease. Redemonstrated extensive chronic infarct affecting
the left basal ganglia and radiating white matter tracts. Unchanged
chronic lacunar infarct within the right thalamocapsular junction.
Chronic small-vessel ischemic changes and wallerian degeneration of
the pons.

Mild paranasal sinus mucosal thickening.

## 2021-10-21 IMAGING — DX DG CHEST 1V PORT
1 series · 1 of 1 positions shown · non-contrast
Comparison: November 09, 2019

CLINICAL DATA: Chest pain

EXAM:
PORTABLE CHEST 1 VIEW

[chest ap]
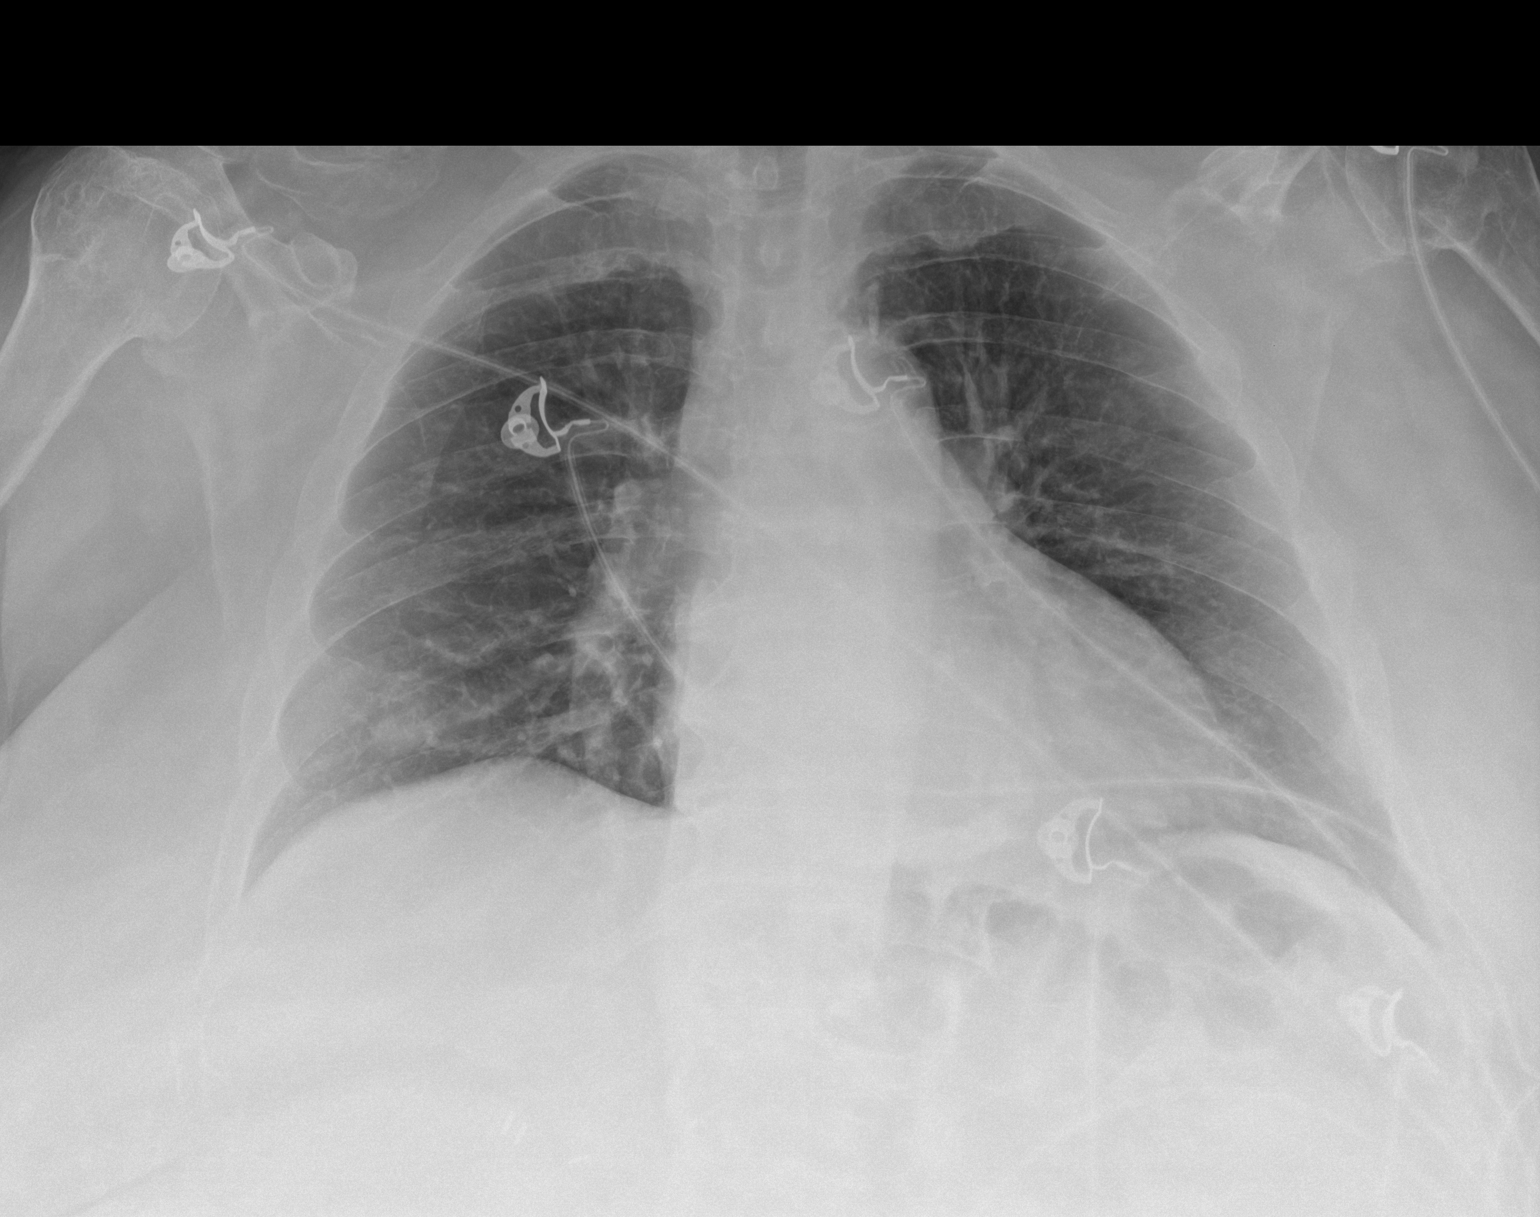

[1 of 1 positions shown; findings below may reference images not displayed]

FINDINGS: There is a questionable nipple shadow at the right base. No edema or
airspace opacity. Heart size and pulmonary vascular normal. There is
mitral annulus calcification. No adenopathy. There is old trauma
with remodeling in the right lateral clavicular region. There is
arthropathy in each shoulder.
IMPRESSION: Suspected nipple shadow right base; advise repeat study with nipple
markers to confirm. No edema or airspace opacity. Heart size normal.
Prior trauma lateral right clavicular region.

## 2021-10-21 IMAGING — CT CT HEAD W/O CM
3 series · 15 of 46 positions shown, 18 images · non-contrast
Comparison: 11/09/2019

CLINICAL DATA: Numbness and tingling.  Paresthesias.

EXAM:
CT HEAD WITHOUT CONTRAST
TECHNIQUE: Contiguous axial images were obtained from the base of the skull
through the vertex without intravenous contrast.

[Series 2: head wo · axial · 0.42mm/px · z∈[+78,+198]mm · 9 of 29 slices shown, 12 images]
[im 3/29  brain]
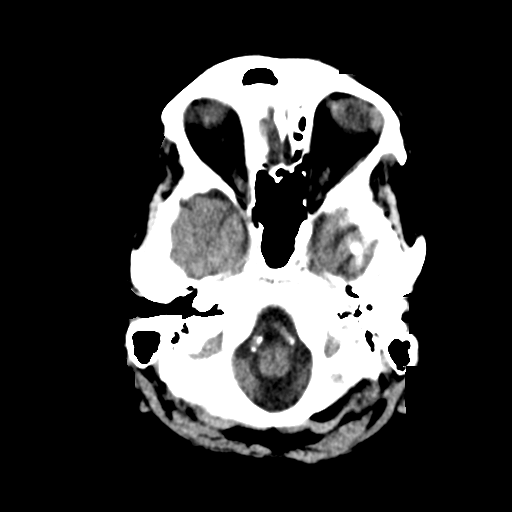
[im 3/29  bone]
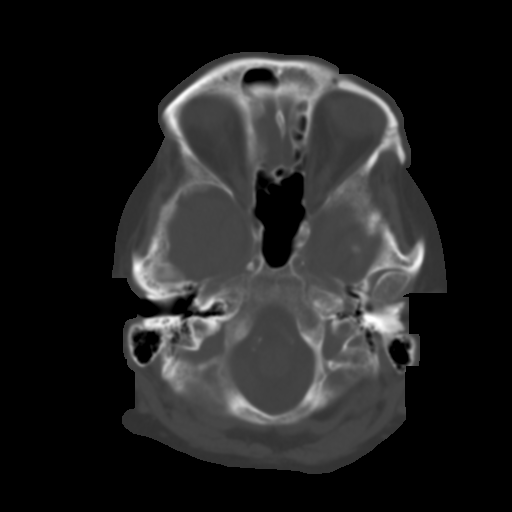
[im 6/29  brain]
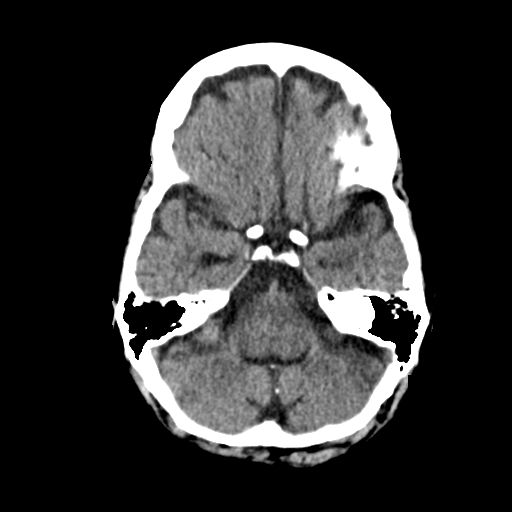
[im 9/29  brain]
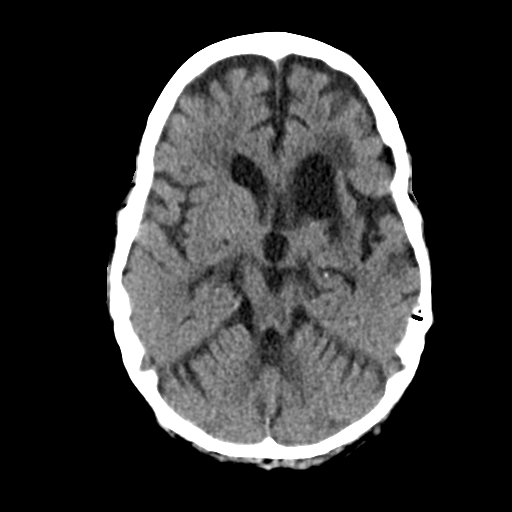
[im 12/29  brain]
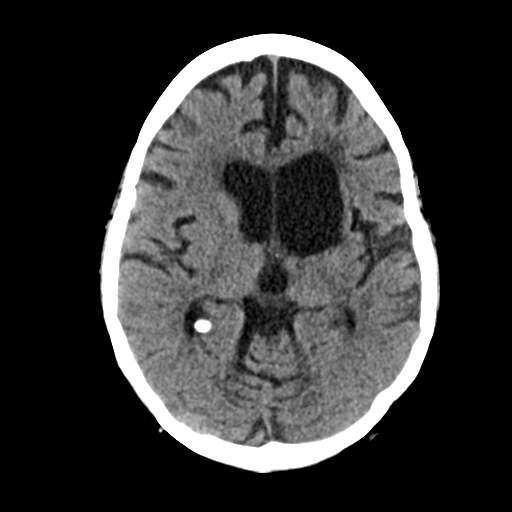
[im 15/29  brain]
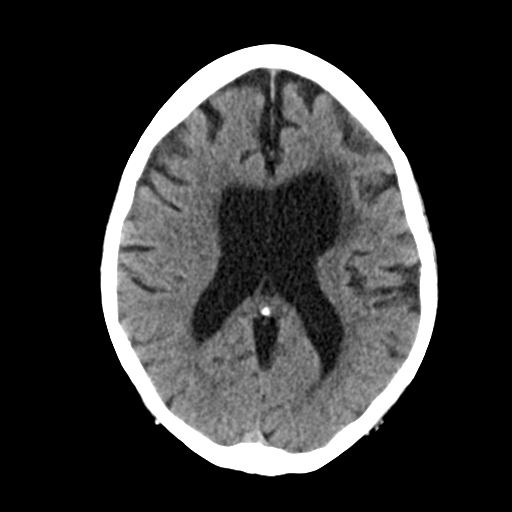
[im 15/29  bone]
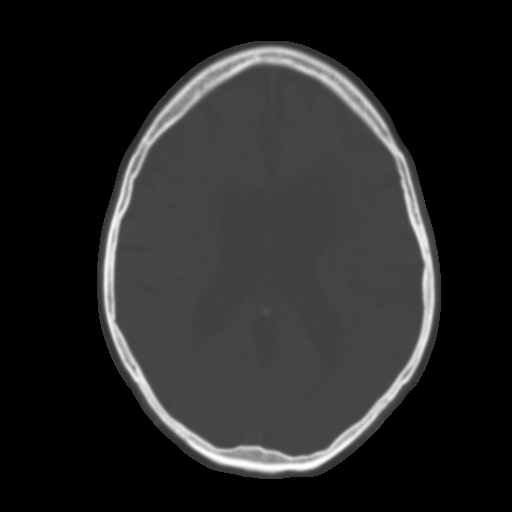
[im 18/29  brain]
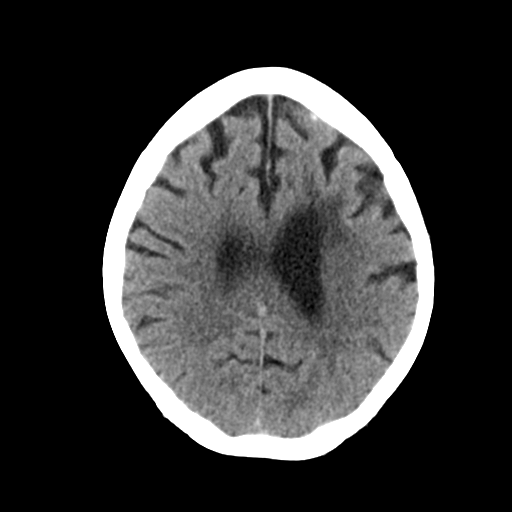
[im 21/29  brain]
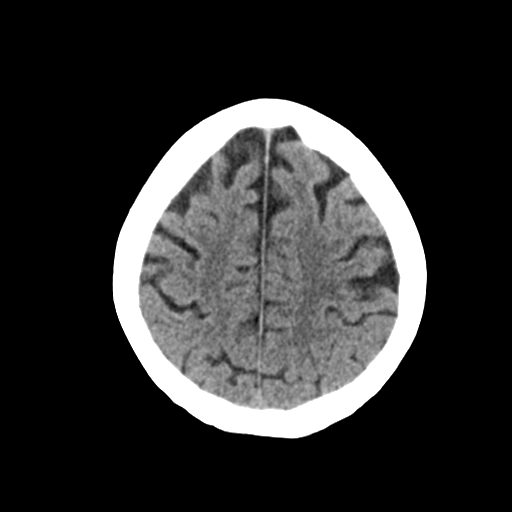
[im 24/29  brain]
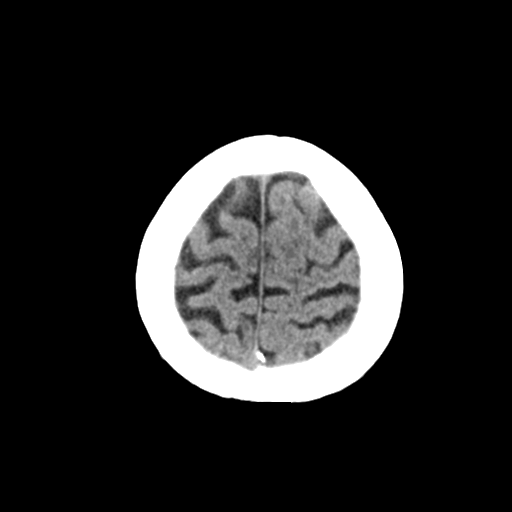
[im 27/29  brain]
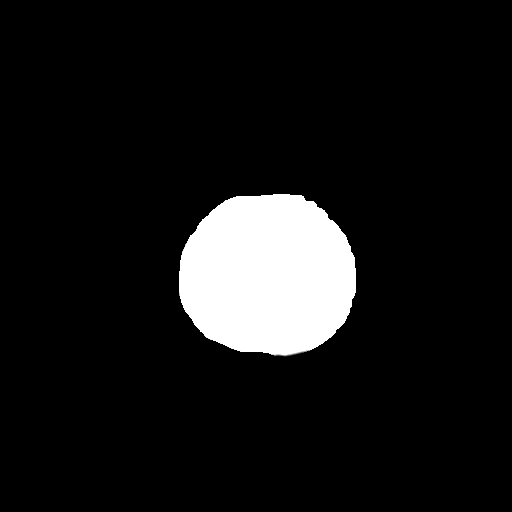
[im 27/29  bone]
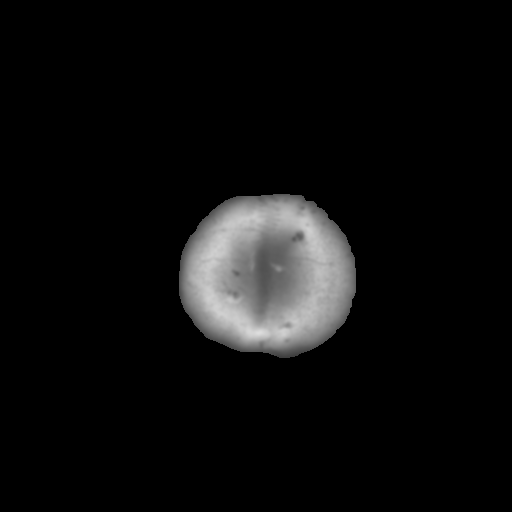

[Series 4: coronal soft tissue · coronal · 0.31mm/px · 3 of 65 slices shown]
[im 22/65  brain]
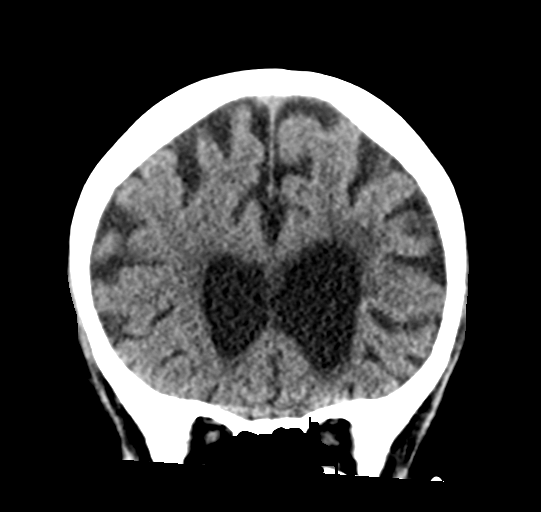
[im 29/65  brain]
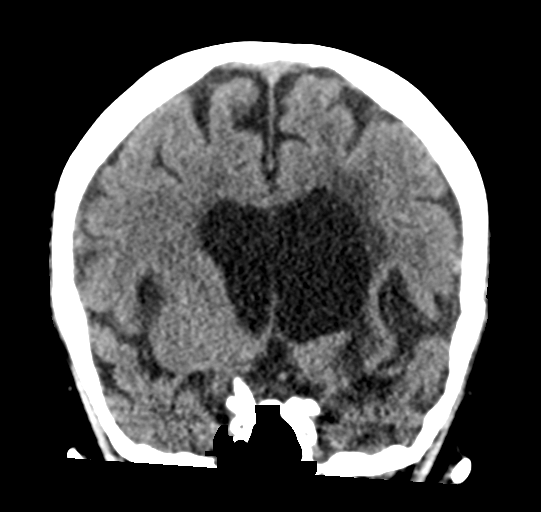
[im 36/65  brain]
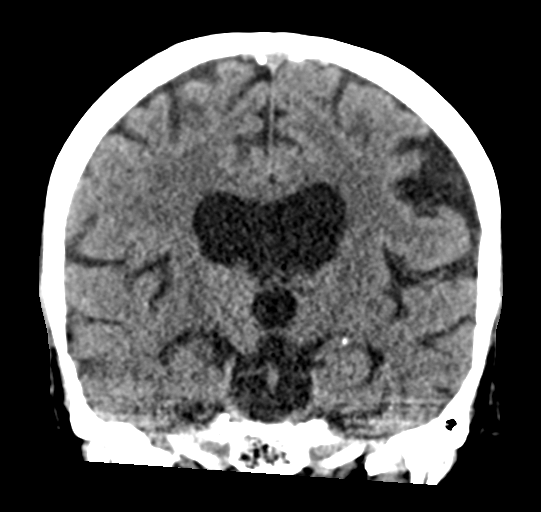

[Series 5: sagittal soft tissue · sagittal · 0.31mm/px · 3 of 49 slices shown]
[im 17/49  brain]
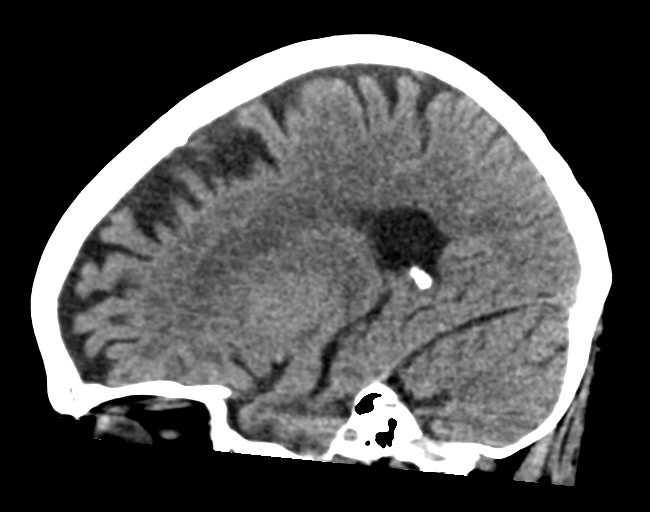
[im 25/49  brain]
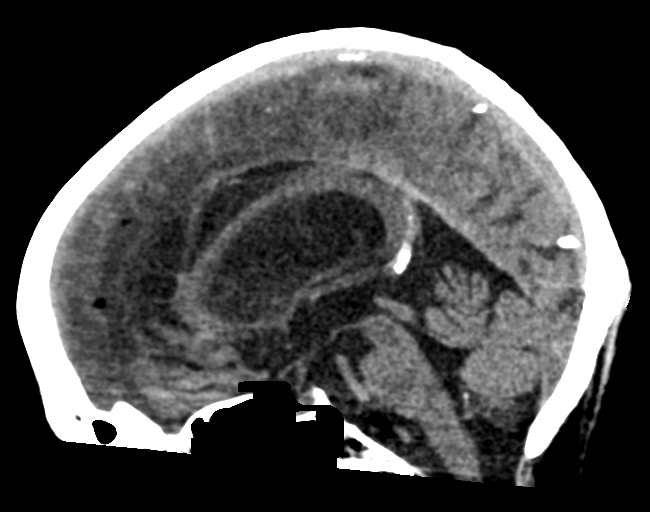
[im 33/49  brain]
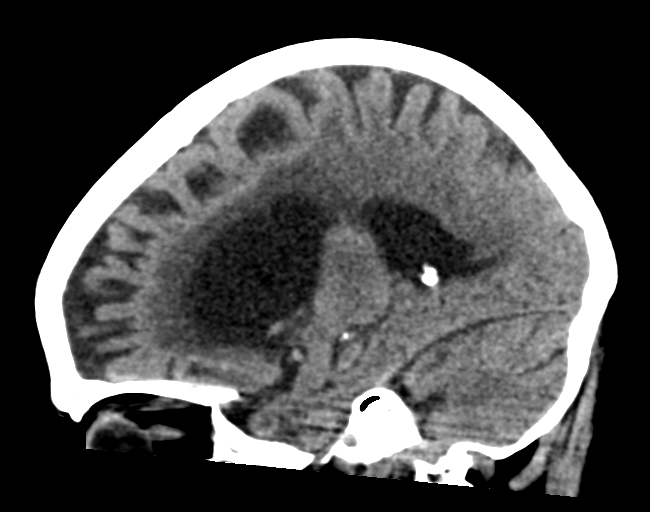

[15 of 46 positions shown; findings below may reference images not displayed]

FINDINGS: Brain: No evidence of acute infarction, hemorrhage, extra-axial
collection, ventriculomegaly, or mass effect. Old left basal ganglia
infarct with ex vacuo dilatation of the frontal horn of the left
lateral ventricle. Generalized cerebral atrophy. Periventricular
white matter low attenuation likely secondary to microangiopathy.

Vascular: Extensive cerebrovascular atherosclerotic calcifications
are noted.

Skull: Negative for fracture or focal lesion.

Sinuses/Orbits: Visualized portions of the orbits are unremarkable.
Visualized portions of the paranasal sinuses and mastoid air cells
are unremarkable.

Other: None.
IMPRESSION: 1. No acute intracranial pathology.
2. Chronic microvascular disease and cerebral atrophy.

## 2022-02-21 NOTE — Telephone Encounter (Signed)
Closing encounter

## 2022-02-24 IMAGING — CT CT HEAD W/O CM
3 series · 15 of 47 positions shown, 18 images · non-contrast
Comparison: Brain MRI 12/23/2019, head CT 12/23/2019.

CLINICAL DATA: Delirium. Additional provided: Acute onset altered
mental status, decreased energy level, vomiting at home.

EXAM:
CT HEAD WITHOUT CONTRAST
TECHNIQUE: Contiguous axial images were obtained from the base of the skull
through the vertex without intravenous contrast.

[Series 2: head wo · axial · 0.43mm/px · z∈[-118,+12]mm · 9 of 32 slices shown, 12 images]
[im 3/32  brain]
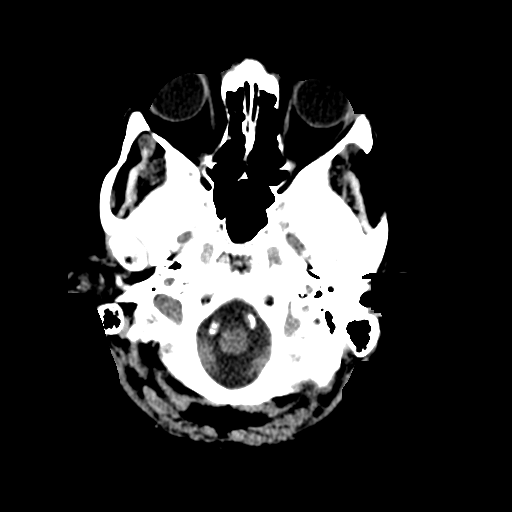
[im 3/32  bone]
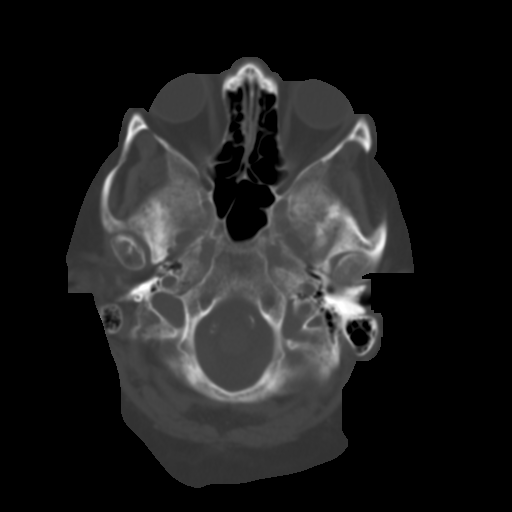
[im 6/32  brain]
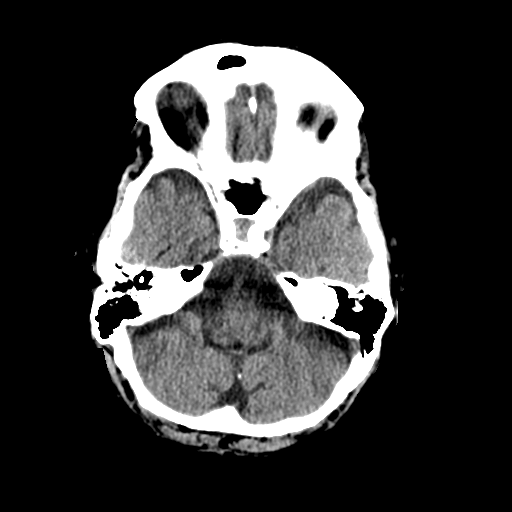
[im 9/32  brain]
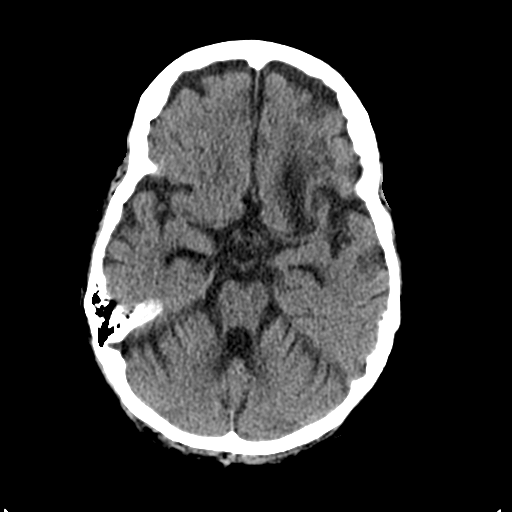
[im 12/32  brain]
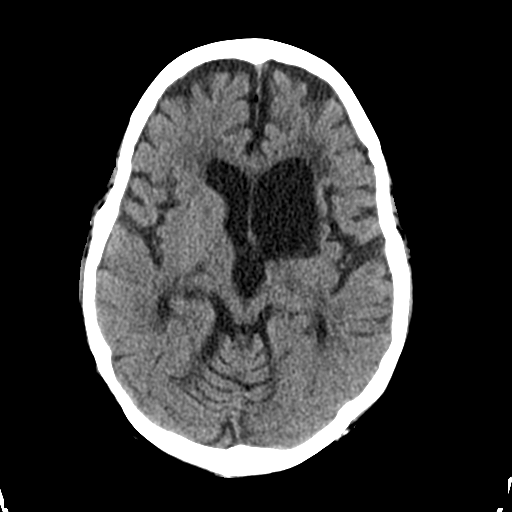
[im 17/32  brain]
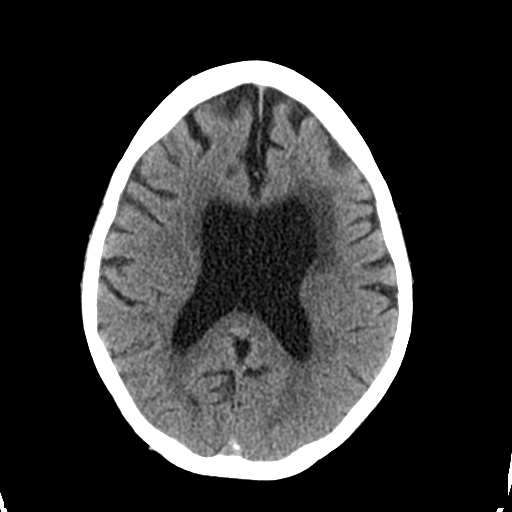
[im 17/32  bone]
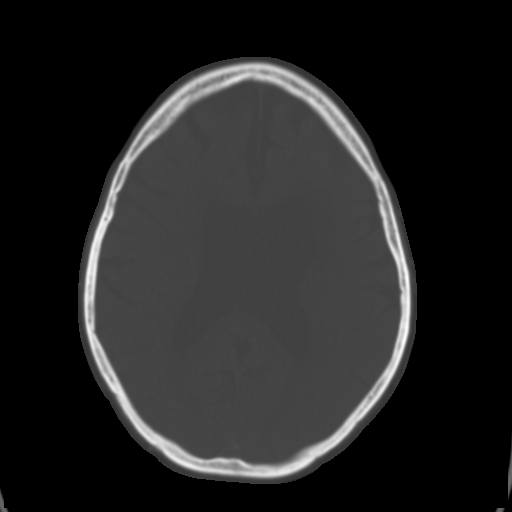
[im 20/32  brain]
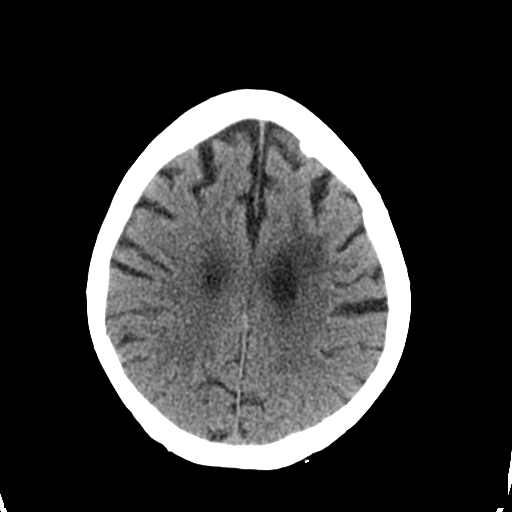
[im 23/32  brain]
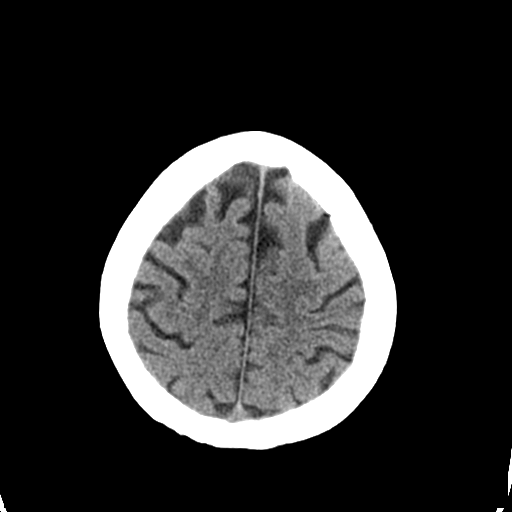
[im 26/32  brain]
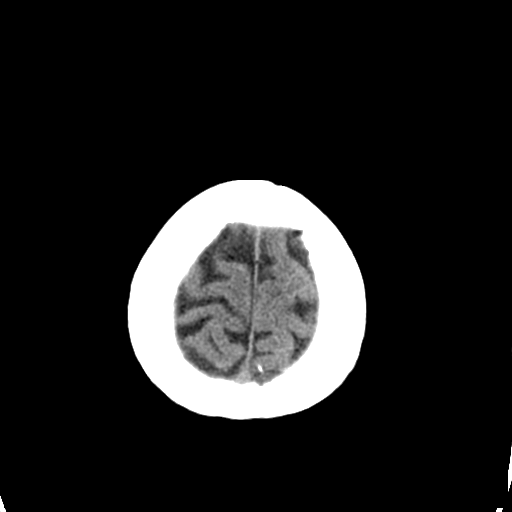
[im 29/32  brain]
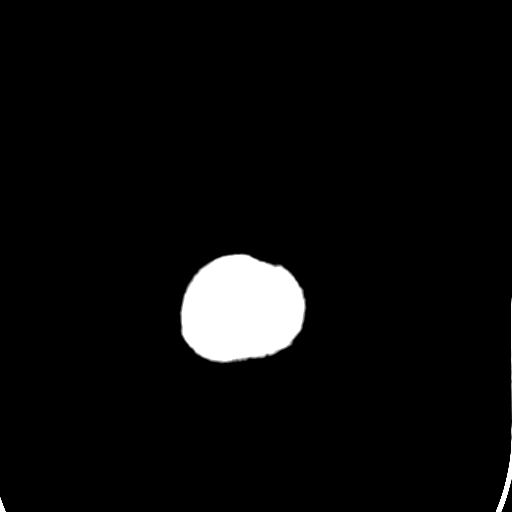
[im 29/32  bone]
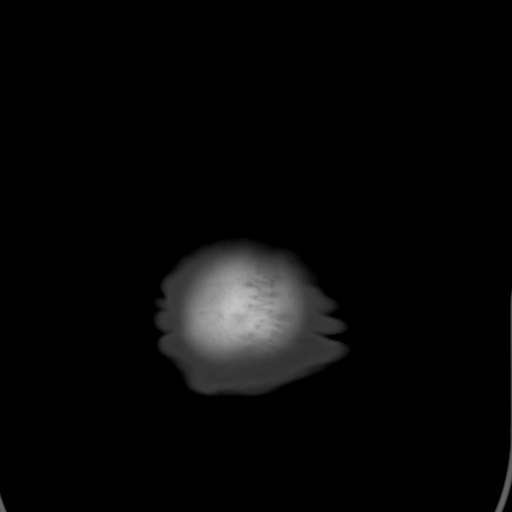

[Series 4: coronal soft tissue · coronal · 0.30mm/px · 3 of 65 slices shown]
[im 22/65  brain]
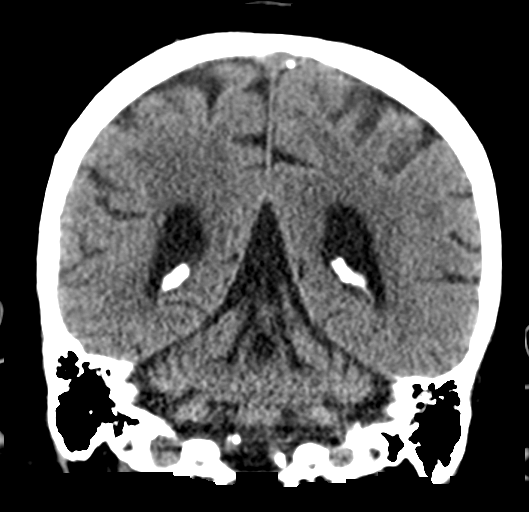
[im 29/65  brain]
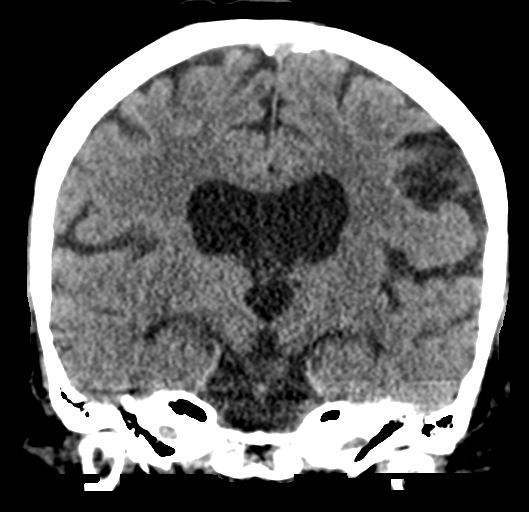
[im 36/65  brain]
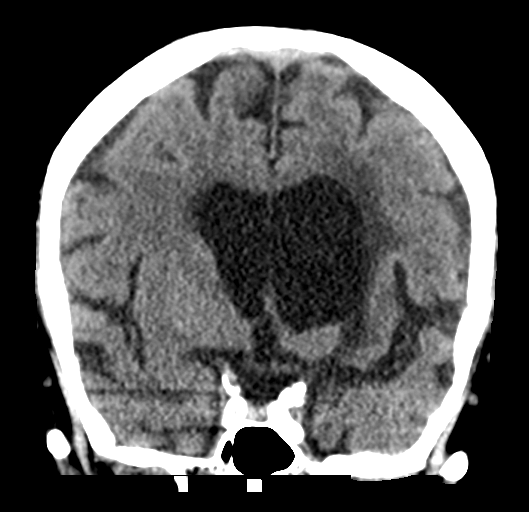

[Series 5: sagittal soft tissue · sagittal · 0.30mm/px · 3 of 53 slices shown]
[im 18/53  brain]
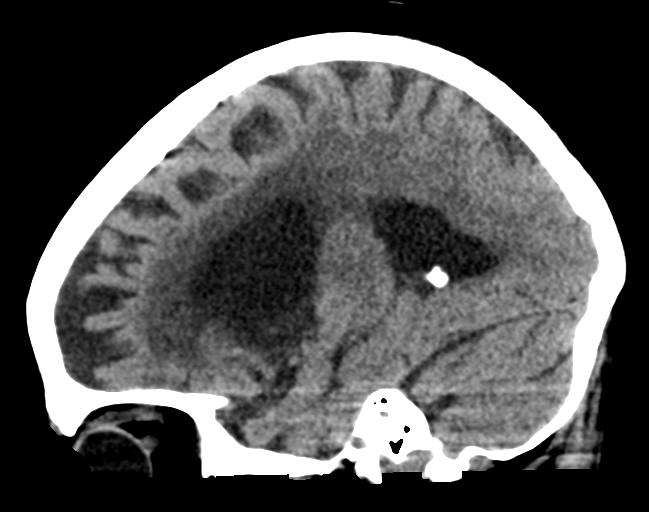
[im 27/53  brain]
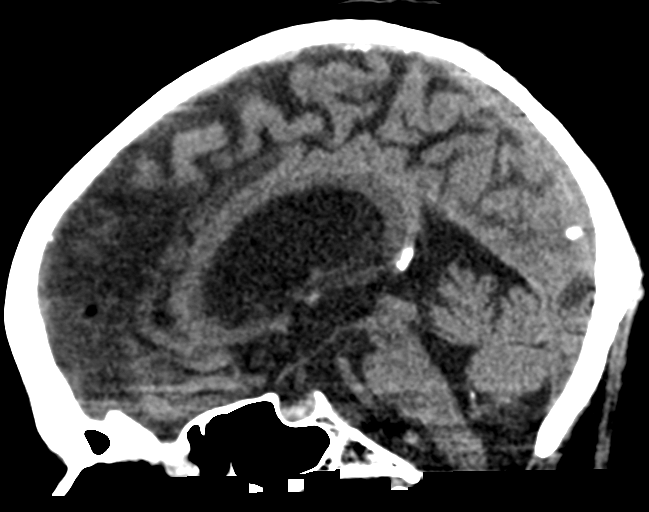
[im 35/53  brain]
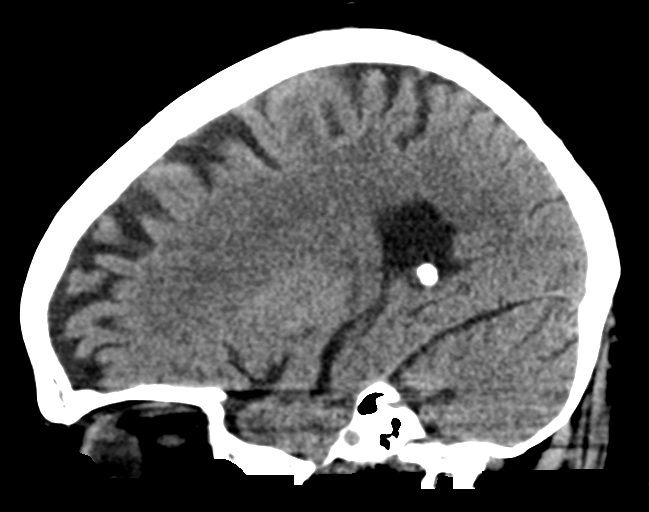

[15 of 47 positions shown; findings below may reference images not displayed]

FINDINGS: Brain:

Generalized cerebral atrophy.

Redemonstrated large chronic infarct affecting the left basal
ganglia and radiating white matter tracts, as well as ventral left
thalamus. Associated ex vacuo dilatation of the left lateral
ventricle.

Redemonstrated chronic lacunar infarct within the right
thalamocapsular junction.

Known small chronic cortically based infarcts within the posterior
right frontal lobe and anterolateral left frontal lobe were better
appreciated on the brain MRI of 12/23/2019.

Stable background mild multifocal ill-defined hypoattenuation within
the cerebral white matter which is nonspecific, but consistent with
chronic small vessel ischemic disease.

There is no acute intracranial hemorrhage.

No acute demarcated cortical infarct.

No extra-axial fluid collection.

No evidence of intracranial mass.

No midline shift.

Vascular: No hyperdense vessel.  Atherosclerotic calcifications

Skull: Normal. Negative for fracture or focal lesion.

Sinuses/Orbits: Visualized orbits show no acute finding. Chronic
medially displaced deformity of the right lamina papyracea. Minimal
ethmoid sinus mucosal thickening. No significant mastoid effusion.
IMPRESSION: No evidence of acute intracranial abnormality.

Cerebral atrophy and chronic ischemic changes with multiple chronic
infarcts as outlined and without interval change from the MRI of
12/23/2019.

Mild ethmoid sinus mucosal thickening.

## 2022-02-24 IMAGING — DX DG CHEST 1V PORT
1 series · 1 of 1 positions shown · non-contrast
Comparison: 12/23/2019

CLINICAL DATA: Altered mental status

EXAM:
PORTABLE CHEST 1 VIEW

[chest ap]
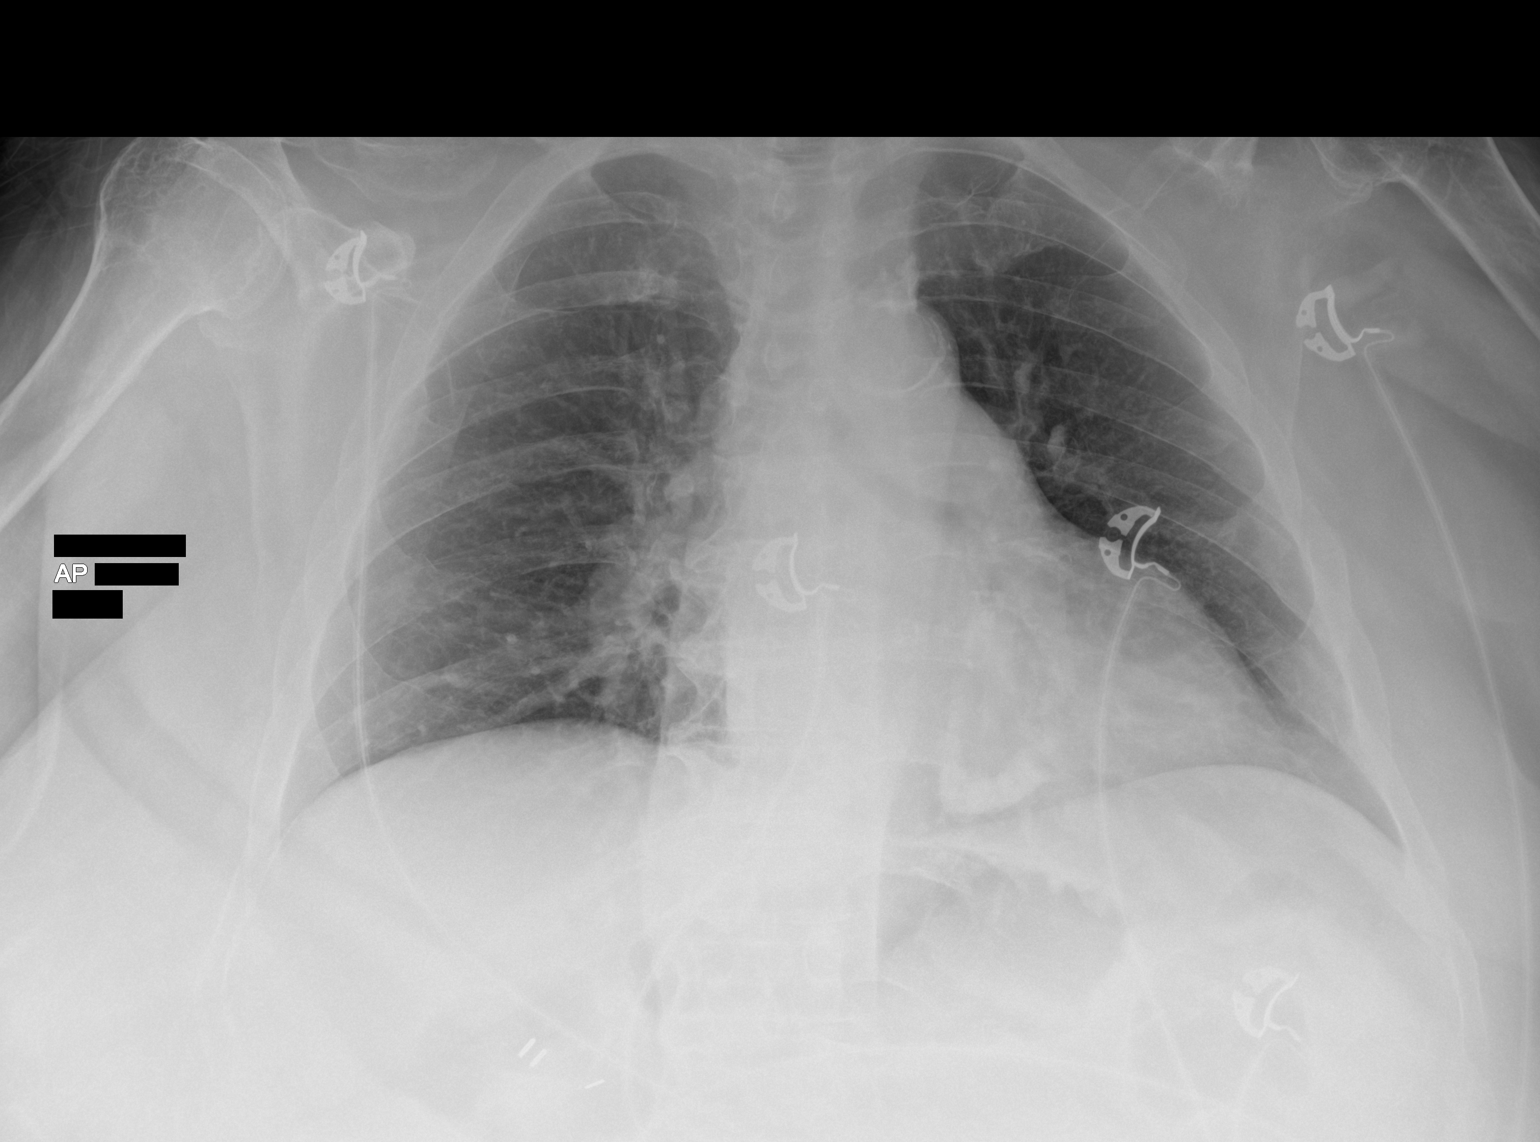

[1 of 1 positions shown; findings below may reference images not displayed]

FINDINGS: The heart size and mediastinal contours are within normal limits.
Both lungs are clear. The visualized skeletal structures are
unremarkable. Bulky calcification of the mitral valve annulus
unchanged.
IMPRESSION: No active disease.

## 2022-03-03 IMAGING — CT CT HEAD W/O CM
3 series · 15 of 47 positions shown, 18 images · non-contrast
Comparison: 04/27/2020

CLINICAL DATA: Mental status change

EXAM:
CT HEAD WITHOUT CONTRAST
TECHNIQUE: Contiguous axial images were obtained from the base of the skull
through the vertex without intravenous contrast.

[Series 3: head wo · axial · 0.45mm/px · z∈[-140,-15]mm · 9 of 30 slices shown, 12 images]
[im 3/30  brain]
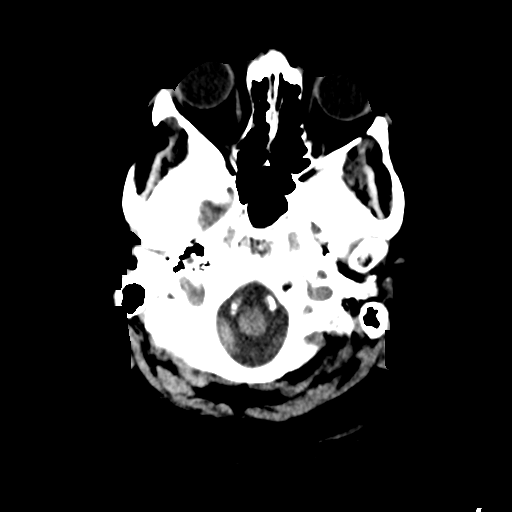
[im 3/30  bone]
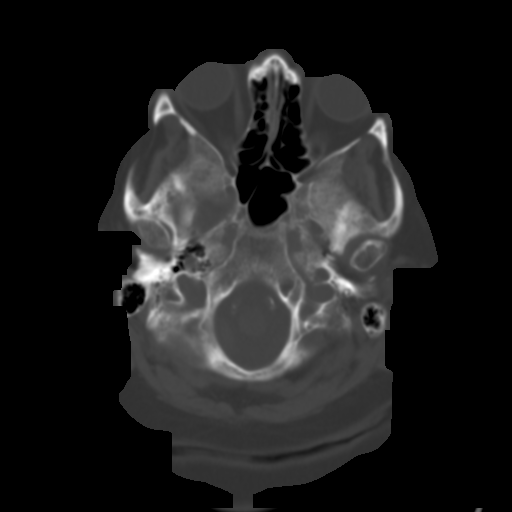
[im 6/30  brain]
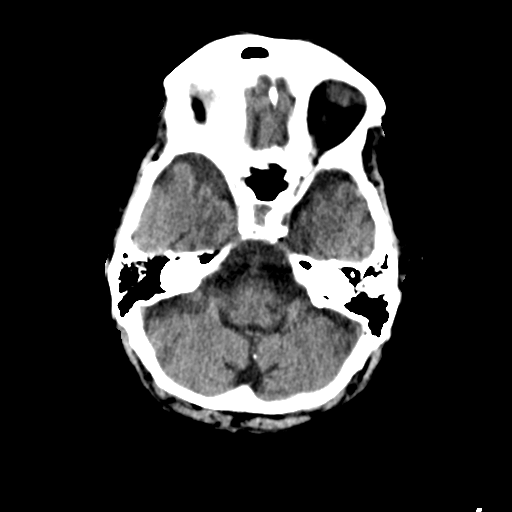
[im 9/30  brain]
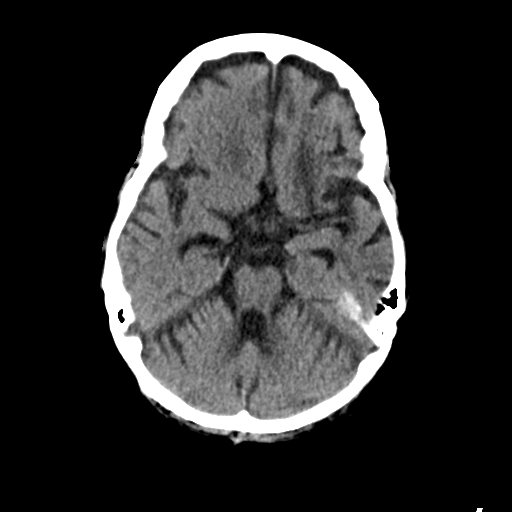
[im 12/30  brain]
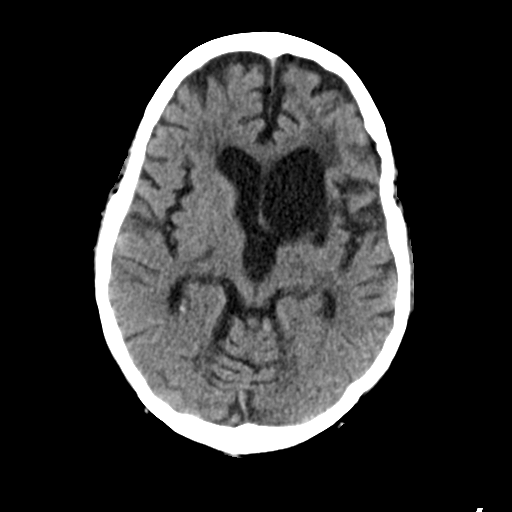
[im 16/30  brain]
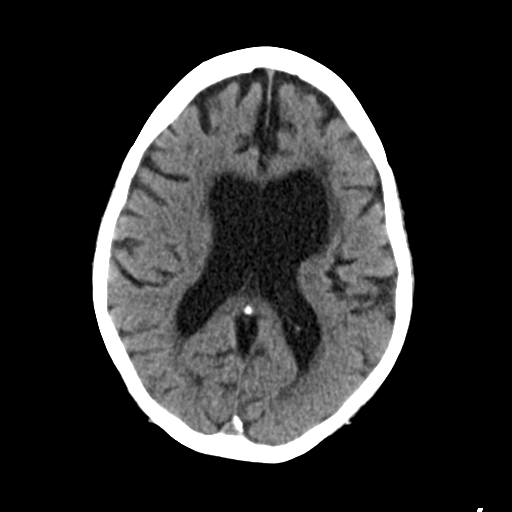
[im 16/30  bone]
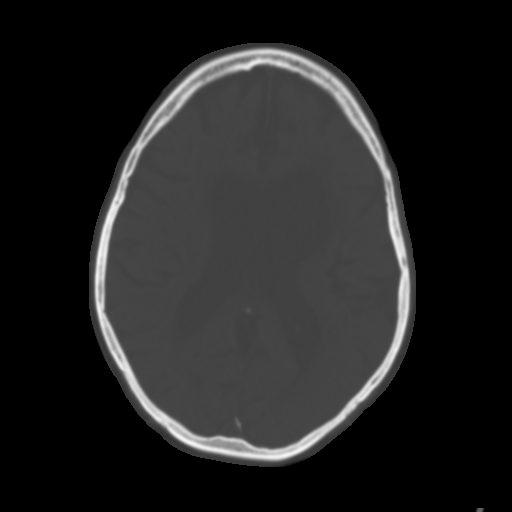
[im 19/30  brain]
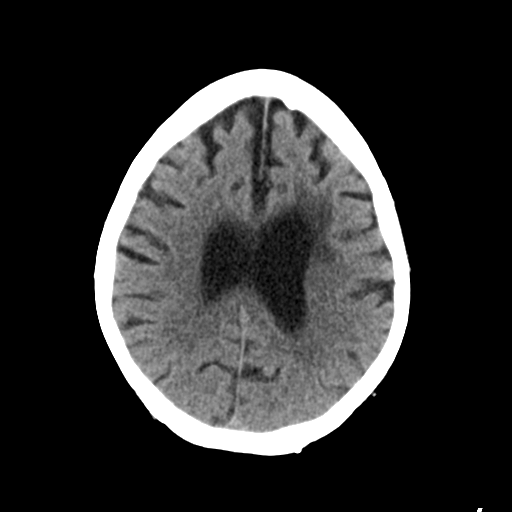
[im 22/30  brain]
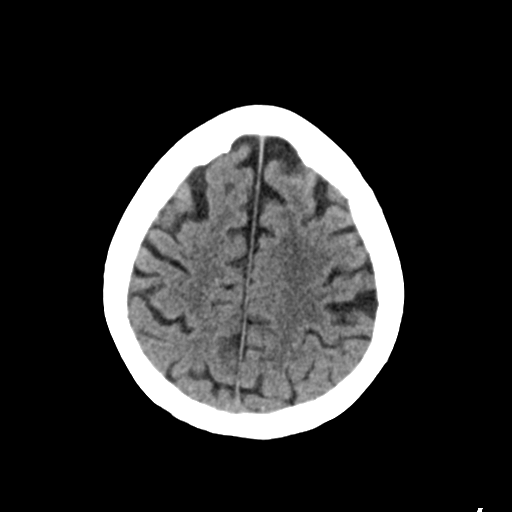
[im 25/30  brain]
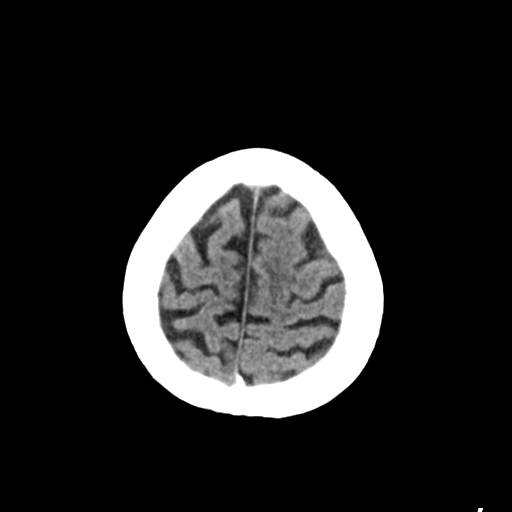
[im 28/30  brain]
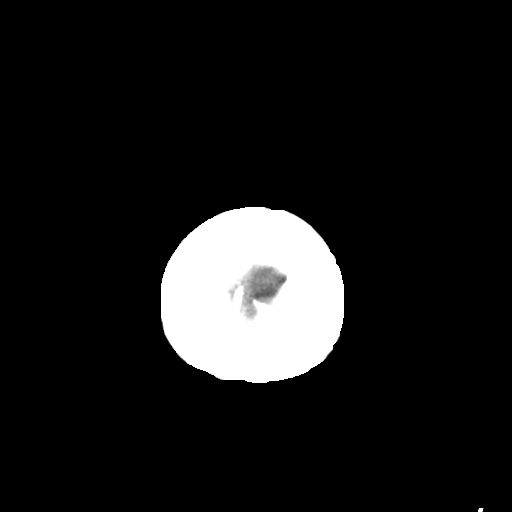
[im 28/30  bone]
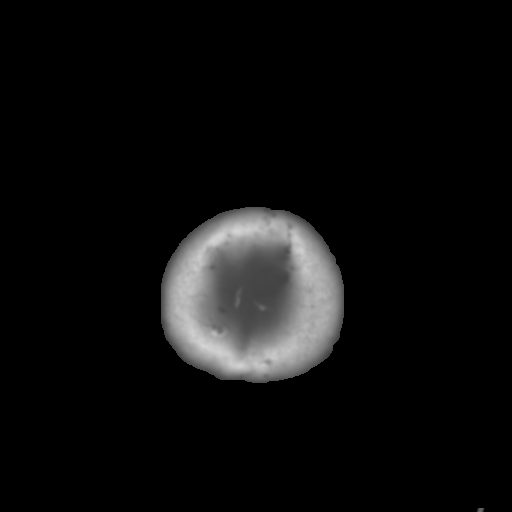

[Series 4: coronal soft tissue · coronal · 0.29mm/px · 3 of 65 slices shown]
[im 22/65  brain]
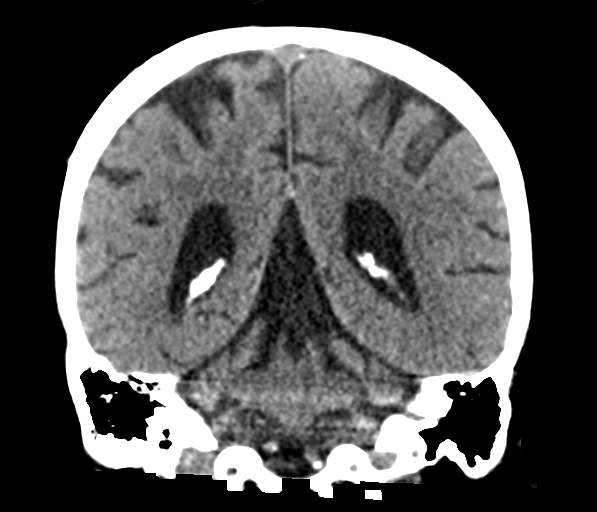
[im 29/65  brain]
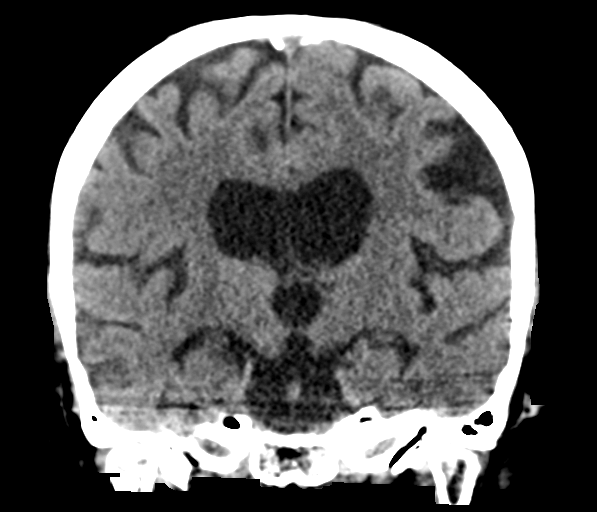
[im 36/65  brain]
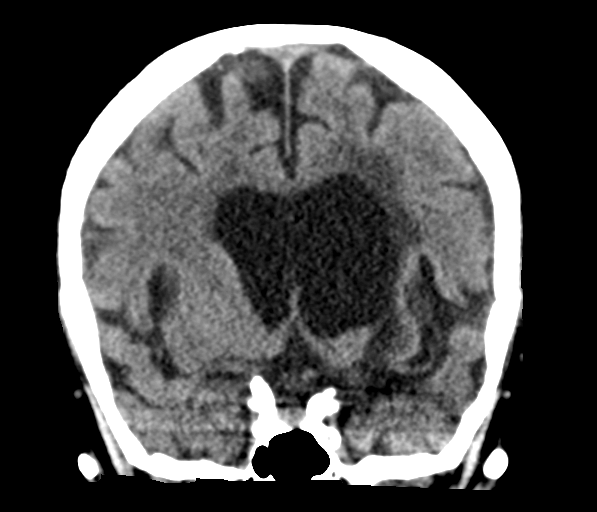

[Series 5: sagittal soft tissue · sagittal · 0.31mm/px · 3 of 47 slices shown]
[im 16/47  brain]
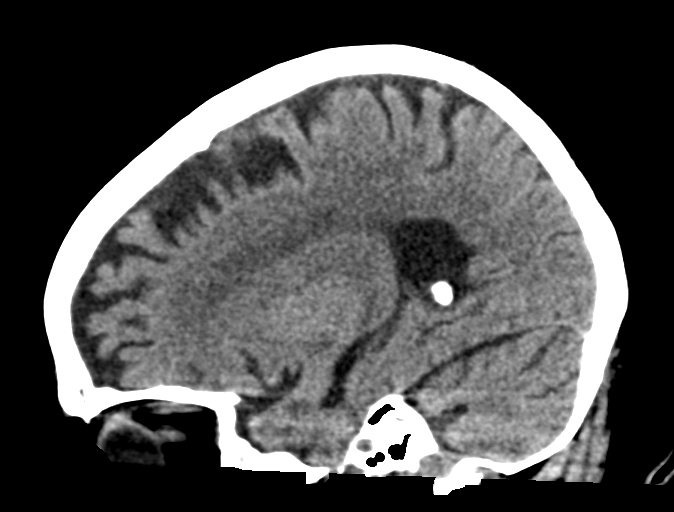
[im 24/47  brain]
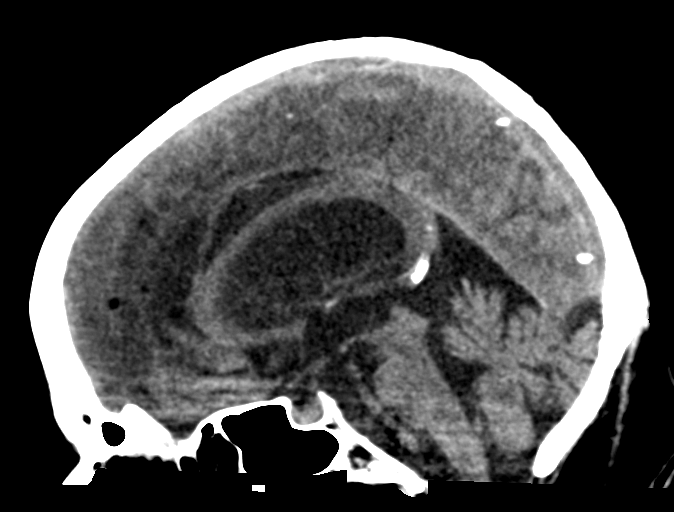
[im 31/47  brain]
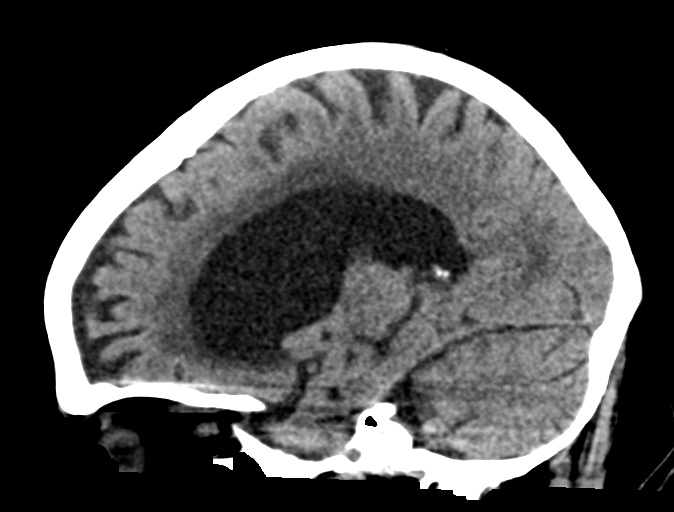

[15 of 47 positions shown; findings below may reference images not displayed]

FINDINGS: Brain: There is no acute intracranial hemorrhage, mass effect, or
edema. No new loss of gray-white differentiation. Chronic infarction
of the left basal ganglia and adjacent white matter is again
identified. Additional patchy and confluent areas of hypoattenuation
in the supratentorial white matter likely reflect stable chronic
microvascular ischemic changes. There is no extra-axial fluid
collection. Ventricles and sulci are stable size and configuration.

Vascular: There is atherosclerotic calcification at the skull base.

Skull: Calvarium is unremarkable.

Sinuses/Orbits: No acute finding.

Other: None.
IMPRESSION: No acute intracranial abnormality. No significant change since
04/27/2020.

## 2022-03-04 IMAGING — MR MR HEAD W/O CM
9 of 10 series · 42 of 48 positions shown · non-contrast
Comparison: Head CT May 04, 2020.

CLINICAL DATA: Stroke follow-up.

EXAM:
MRI HEAD WITHOUT CONTRAST
TECHNIQUE: Multiplanar, multiecho pulse sequences of the brain and surrounding
structures were obtained without intravenous contrast.

[Series 2: ax dwi_tracew · axial · 3.0mm · 0.71mm/px · z∈[-125,+40]mm · 7 of 56 slices shown]
[im 1/56]
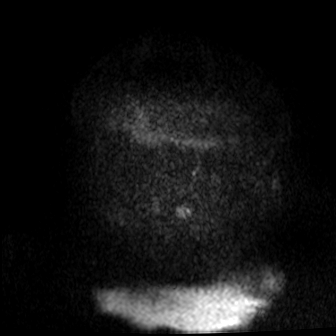
[im 10/56]
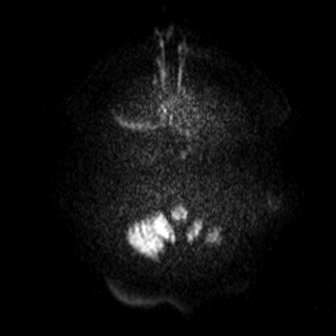
[im 19/56]
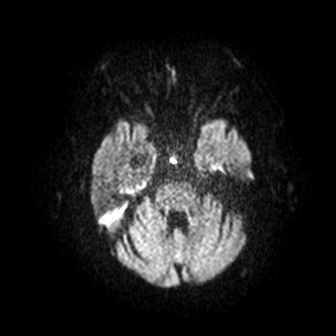
[im 28/56]
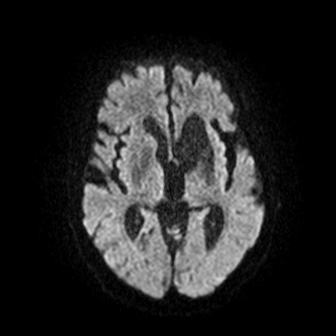
[im 37/56]
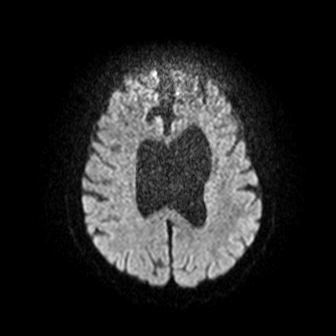
[im 46/56]
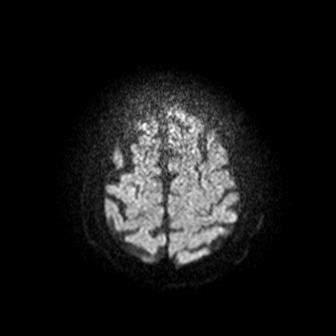
[im 56/56]
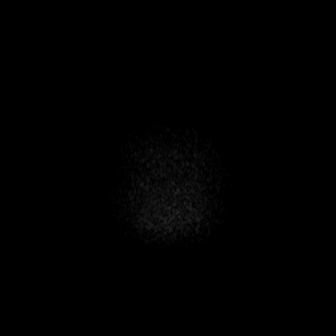

[Series 3: ax dwi_adc · axial · 3.0mm · 0.71mm/px · z∈[-125,+40]mm · 8 of 56 slices shown]
[im 1/56]
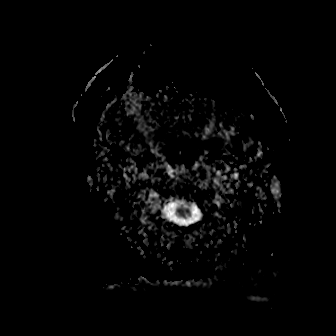
[im 8/56]
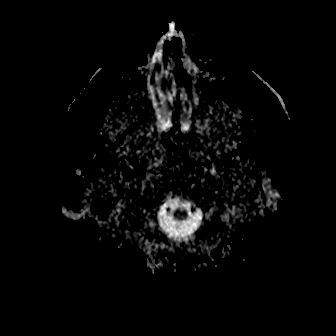
[im 16/56]
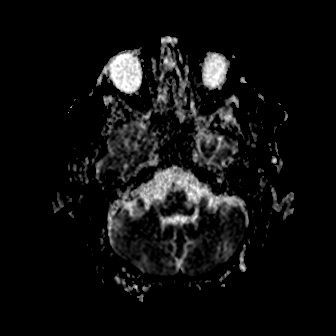
[im 24/56]
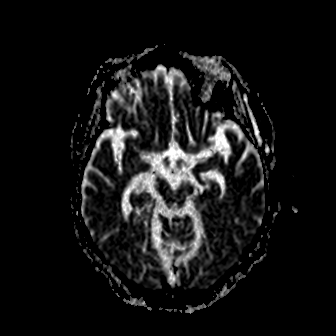
[im 32/56]
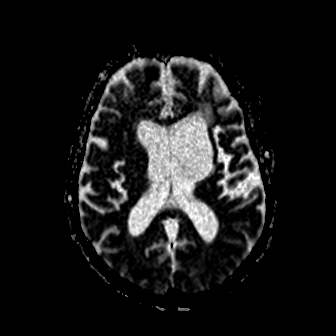
[im 40/56]
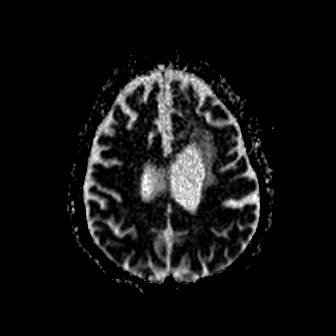
[im 48/56]
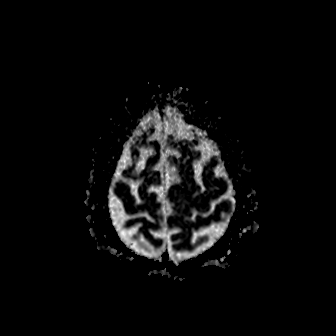
[im 56/56]
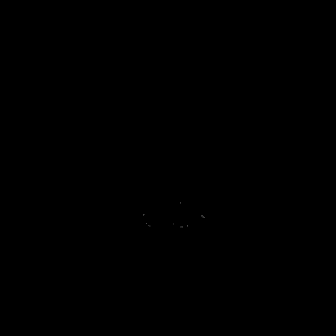

[Series 4: cor dwi_tracew · coronal · 5.0mm · 0.68mm/px · 5 of 40 slices shown]
[im 1/40]
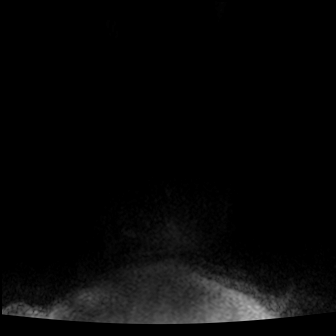
[im 10/40]
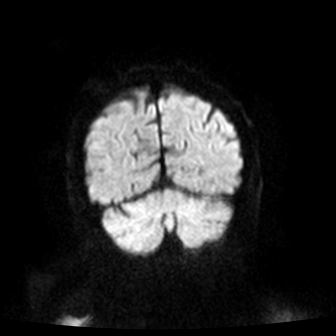
[im 20/40]
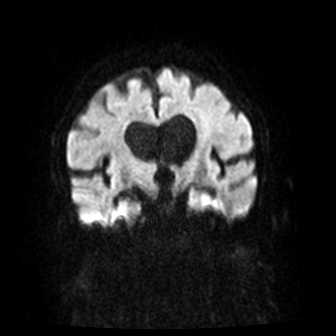
[im 30/40]
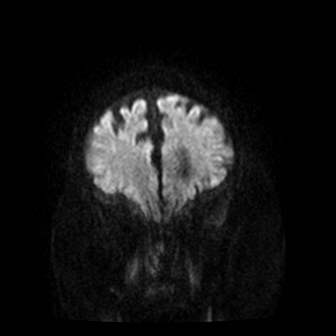
[im 40/40]
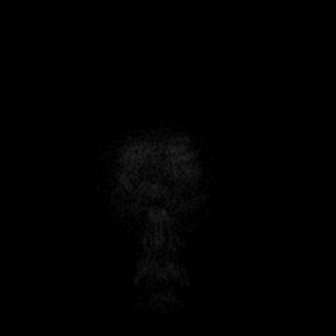

[Series 5: cor dwi_adc · coronal · 5.0mm · 0.68mm/px · 3 of 40 slices shown]
[im 1/40]
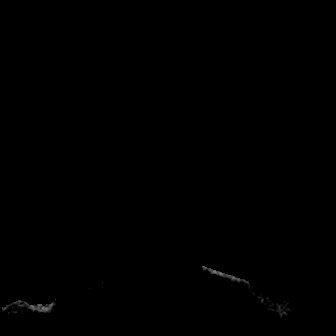
[im 10/40]
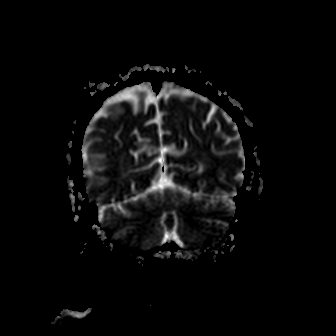
[im 20/40]
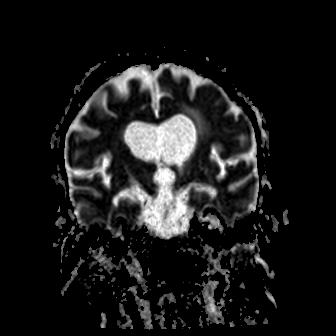

[Series 6: T1 · sagittal · 5.0mm · 0.94mm/px · 3 of 25 slices shown (1 of 2)]
[im 1/25]
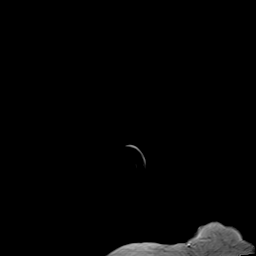
[im 13/25]
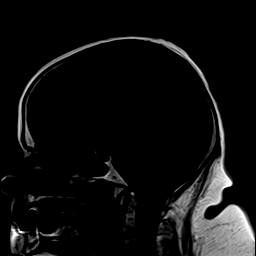
[im 25/25]
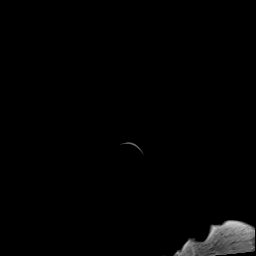

[Series 7: T2 · axial · 5.0mm · 0.45mm/px · z∈[-120,+36]mm · 4 of 27 slices shown (1 of 2)]
[im 1/27]
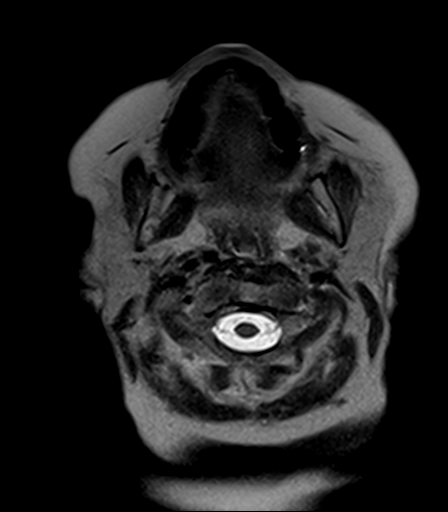
[im 9/27]
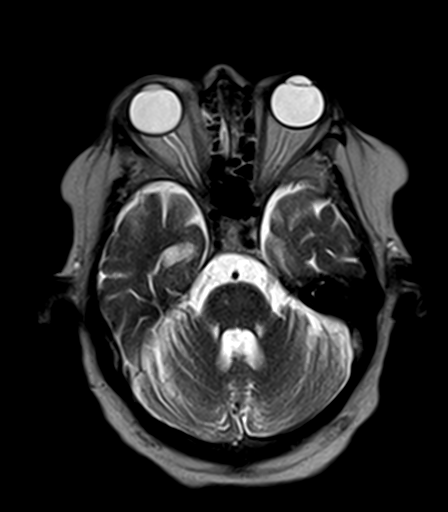
[im 18/27]
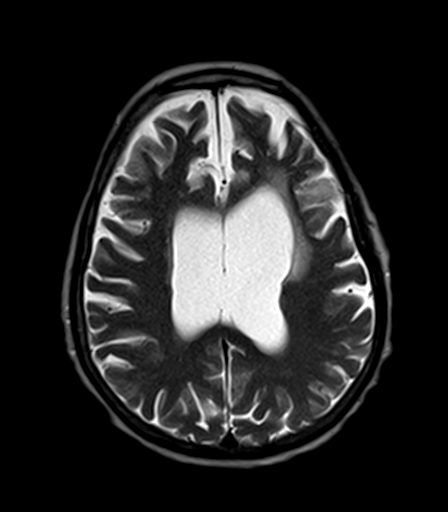
[im 27/27]
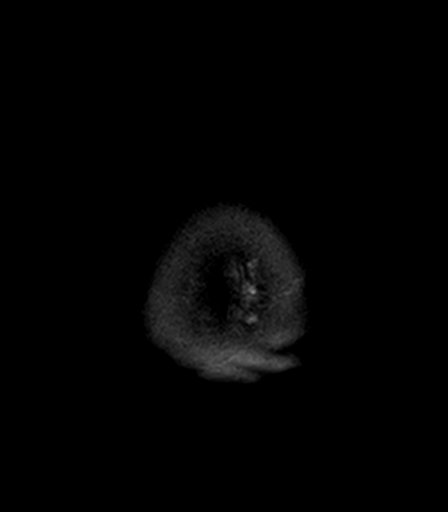

[Series 9: T1 · axial · 5.0mm · 0.90mm/px · z∈[-120,+36]mm · 4 of 27 slices shown (2 of 2)]
[im 1/27]
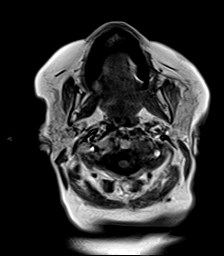
[im 9/27]
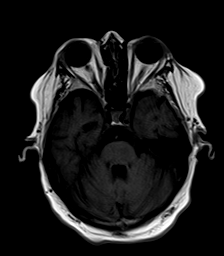
[im 18/27]
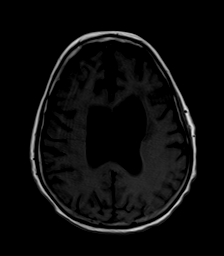
[im 27/27]
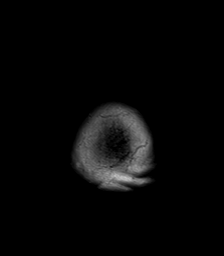

[Series 10: FLAIR · axial · 5.0mm · 1.20mm/px · z∈[-120,+36]mm · 4 of 27 slices shown]
[im 1/27]
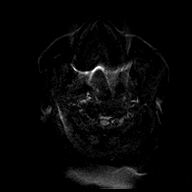
[im 9/27]
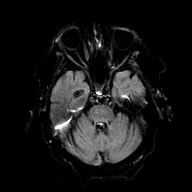
[im 18/27]
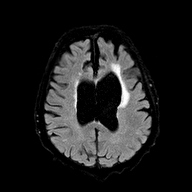
[im 27/27]
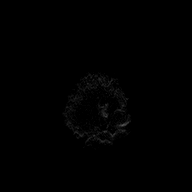

[Series 11: T2 · coronal · 5.0mm · 0.45mm/px · 4 of 31 slices shown (2 of 2)]
[im 1/31]
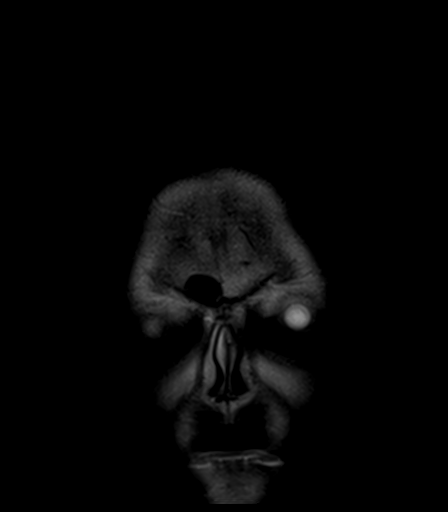
[im 11/31]
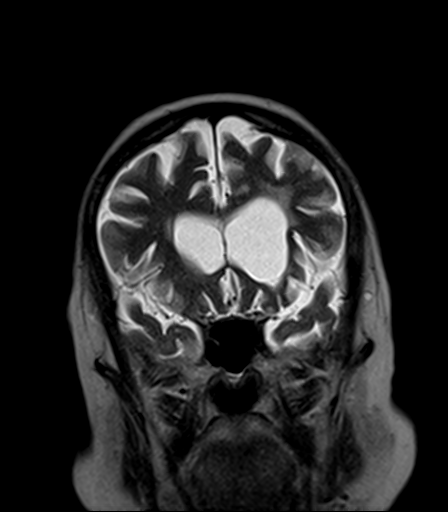
[im 21/31]
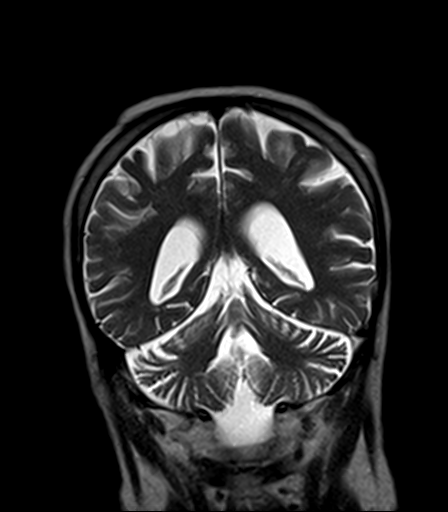
[im 31/31]
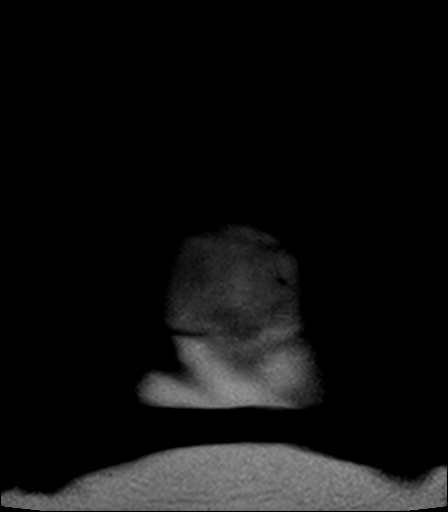

[42 of 48 positions shown; findings below may reference images not displayed]

FINDINGS: Brain: No acute infarction, hemorrhage, hydrocephalus, extra-axial
collection or mass lesion.

Remote infarct involving the left lenticulocapsular region, left
thalamus and deep white matter of the left frontal lobe with
associated retraction of left lateral ventricle. Remote infarct is
also seen in the posterior limb of the right internal capsule.

Scattered foci of T2 hyperintensity are seen within the white matter
of the cerebral hemispheres, nonspecific, most likely related to
chronic small vessel ischemia.

Moderate diffuse parenchymal volume loss, more pronounced at the
cerebral peduncles and pons, may be related to wallerian
degeneration.

Vascular: Normal flow voids.

Skull and upper cervical spine: Normal marrow signal.

Sinuses/Orbits: Bilateral lens surgery mucosal thickening of the
bilateral ethmoid cells.

Other: None.
IMPRESSION: 1. No acute intracranial abnormality.
2. Remote infarct involving the left lenticulo capsular region, left
thalamus and deep white matter of the left frontal lobe and in the
posterior limb of the right internal capsule.
3. Moderate diffuse parenchymal volume loss, more pronounced at the
cerebral peduncles and pons, may be related to Wallerian
degeneration.
4. Mild chronic small vessel ischemia.

## 2022-09-27 IMAGING — CT CT HEAD W/O CM
3 series · 15 of 47 positions shown, 18 images · non-contrast
Comparison: CT 05/04/2020, MRI 05/05/2020

CLINICAL DATA: Headache

EXAM:
CT HEAD WITHOUT CONTRAST
TECHNIQUE: Contiguous axial images were obtained from the base of the skull
through the vertex without intravenous contrast.

[Series 2: head wo · axial · 0.42mm/px · z∈[+686,+811]mm · 9 of 30 slices shown, 12 images]
[im 3/30  brain]
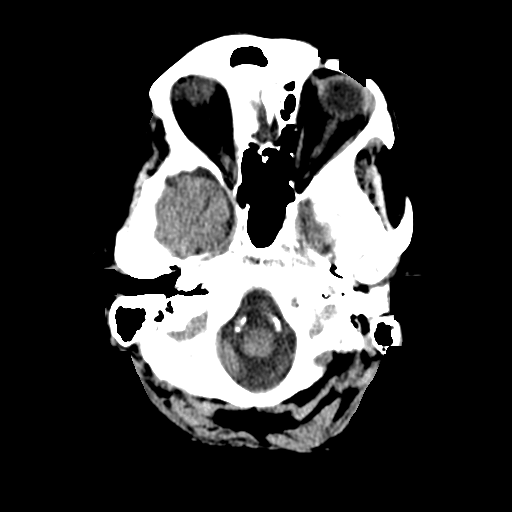
[im 3/30  bone]
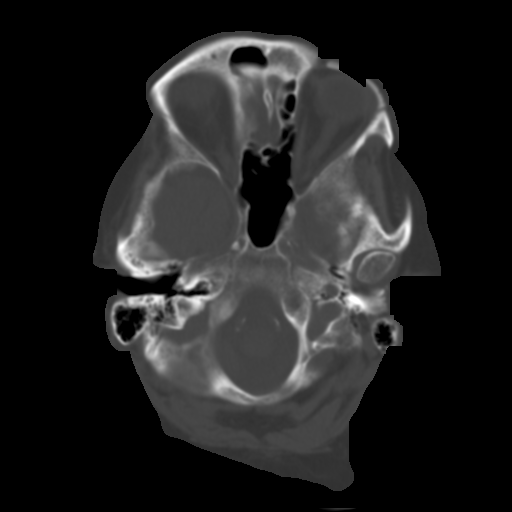
[im 6/30  brain]
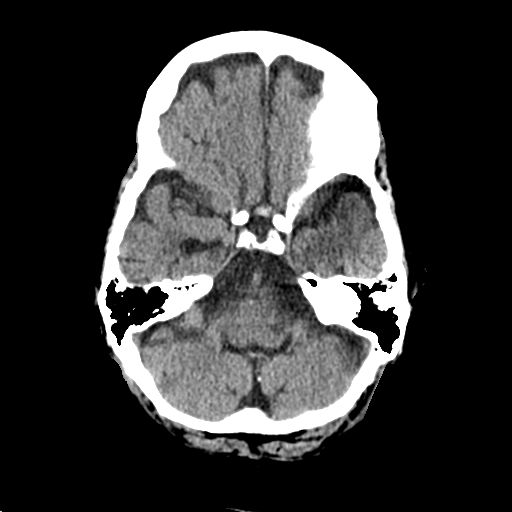
[im 9/30  brain]
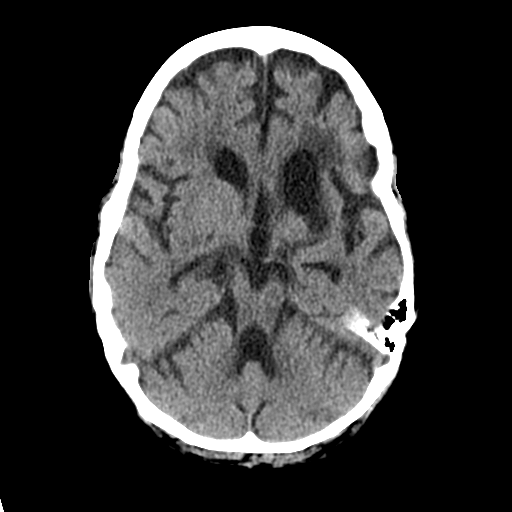
[im 12/30  brain]
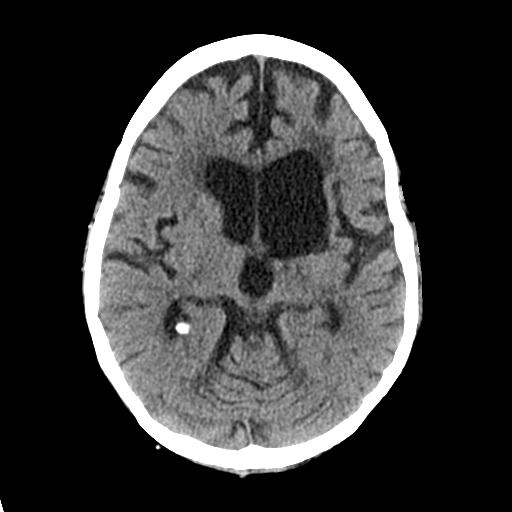
[im 16/30  brain]
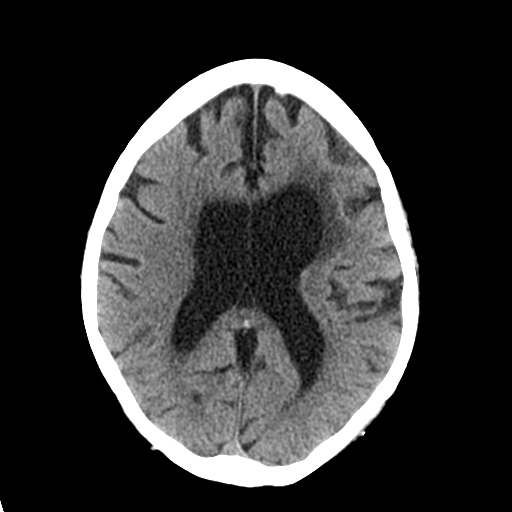
[im 16/30  bone]
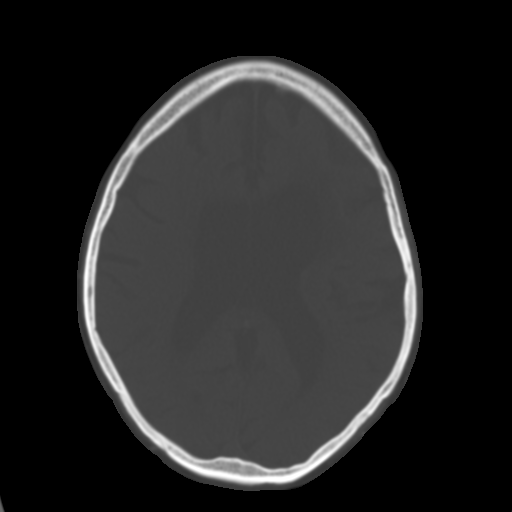
[im 19/30  brain]
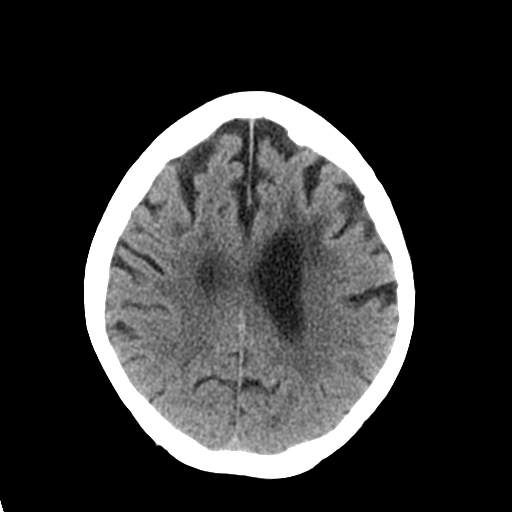
[im 22/30  brain]
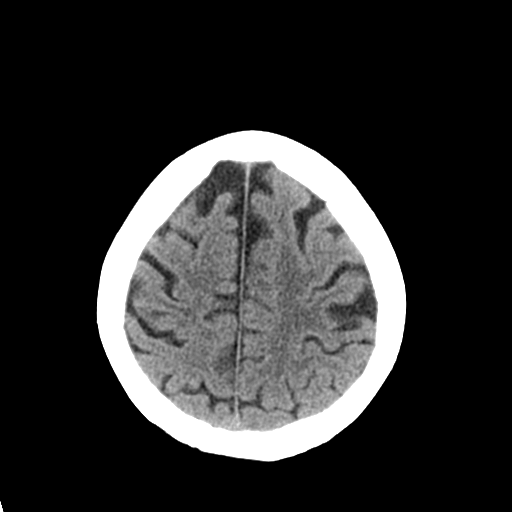
[im 25/30  brain]
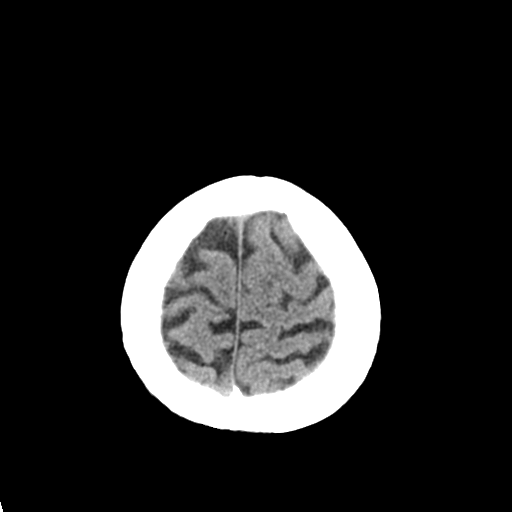
[im 28/30  brain]
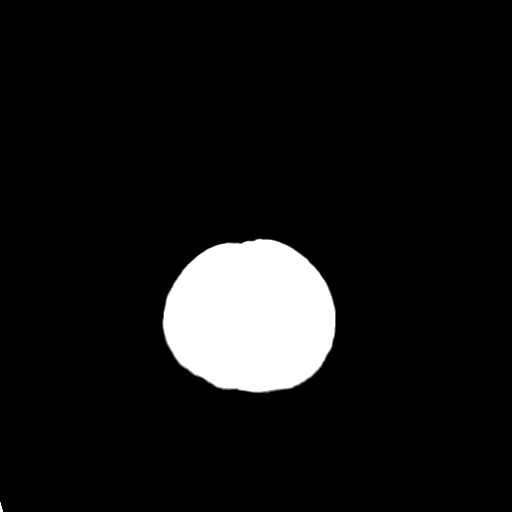
[im 28/30  bone]
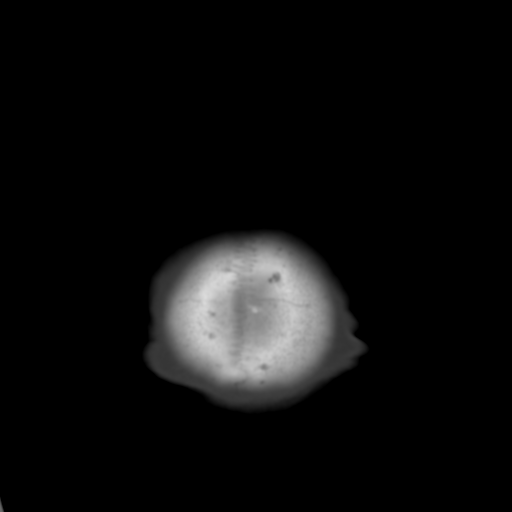

[Series 4: coronal soft tissue · coronal · 0.30mm/px · 3 of 66 slices shown]
[im 22/66  brain]
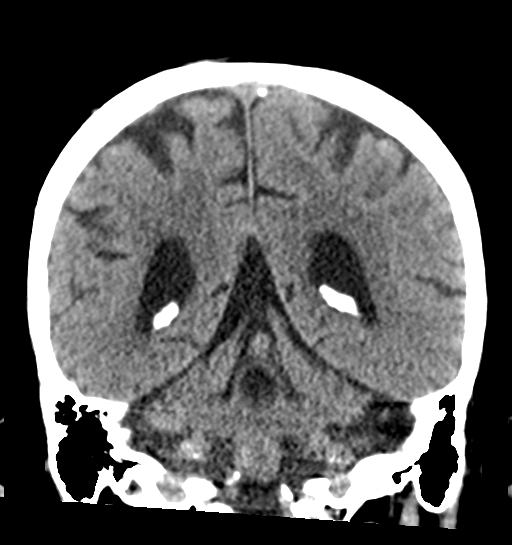
[im 29/66  brain]
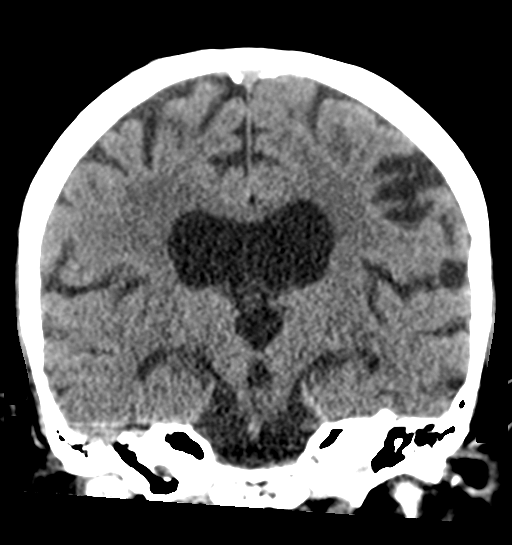
[im 37/66  brain]
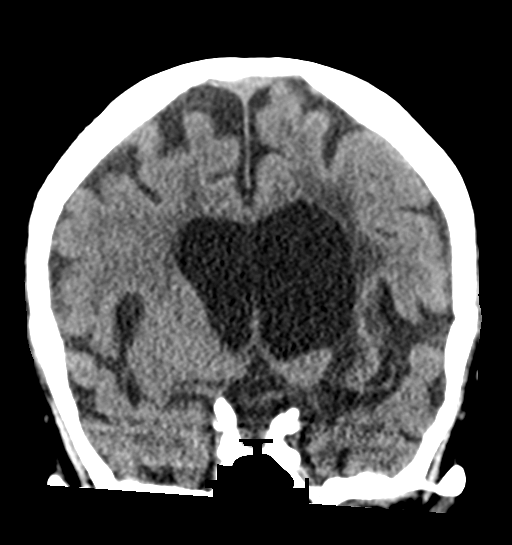

[Series 5: sagittal soft tissue · sagittal · 0.32mm/px · 3 of 52 slices shown]
[im 18/52  brain]
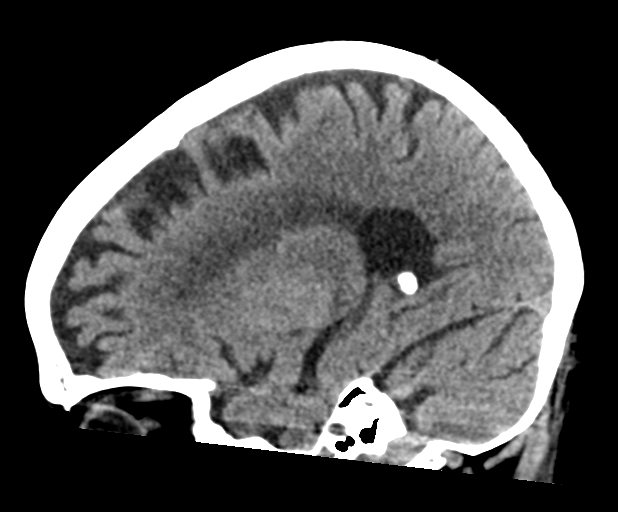
[im 26/52  brain]
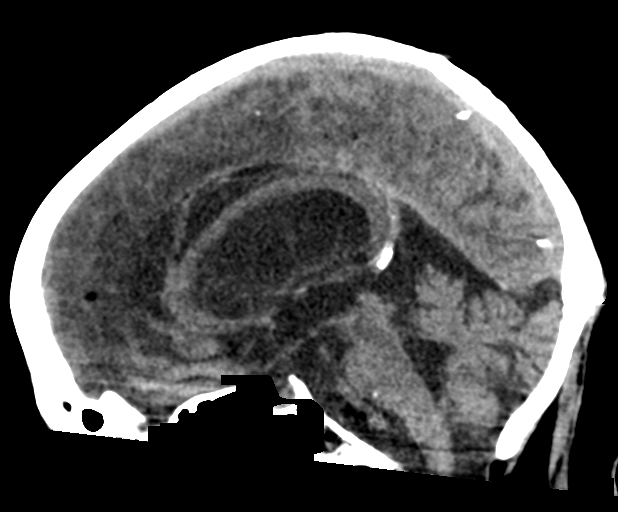
[im 35/52  brain]
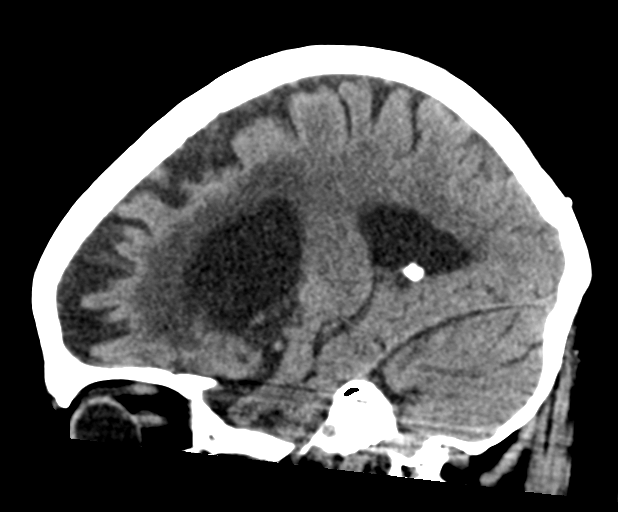

[15 of 47 positions shown; findings below may reference images not displayed]

FINDINGS: Brain: Moderate parenchymal volume loss is again identified,
slightly advanced in relation to the patient's age, but stable since
prior examination. Remote left periventricular white matter and
lentiform nuclear infarcts are again identified with ex vacuo
dilation of the frontal horn of the left lateral ventricle. Remote
infarct involving the posterior limb of the right internal capsule
again noted. Mild periventricular white matter changes are present
likely reflecting the sequela of small vessel ischemia.

No evidence of acute intracranial hemorrhage or infarct. No abnormal
mass effect or midline shift. No abnormal intra or extra-axial mass
lesion or fluid collection. Borderline ventriculomegaly is stable,
likely the sequela of central atrophy. The cerebellum is
unremarkable. Asymmetric atrophy involving the cerebral peduncles
and pons is again noted.

Vascular: No asymmetric hyperdense vasculature at the skull base.
Advanced vascular calcifications are noted within the carotid
siphons and distal vertebral arteries bilaterally.

Skull: Intact

Sinuses/Orbits: Remote right medial orbital wall fracture again
identified. The visualized orbits are otherwise unremarkable. The
visualized paranasal sinuses are clear.

Other: Mastoid air cells and middle ear cavities are clear.
IMPRESSION: No evidence of acute intracranial hemorrhage or infarct.

Stable remote infarcts and advanced senescent change.

## 2022-09-28 IMAGING — DX DG CHEST 1V PORT
1 series · 1 of 1 positions shown · non-contrast
Comparison: 04/27/2020

CLINICAL DATA: Weakness

EXAM:
PORTABLE CHEST 1 VIEW

[chest ap]
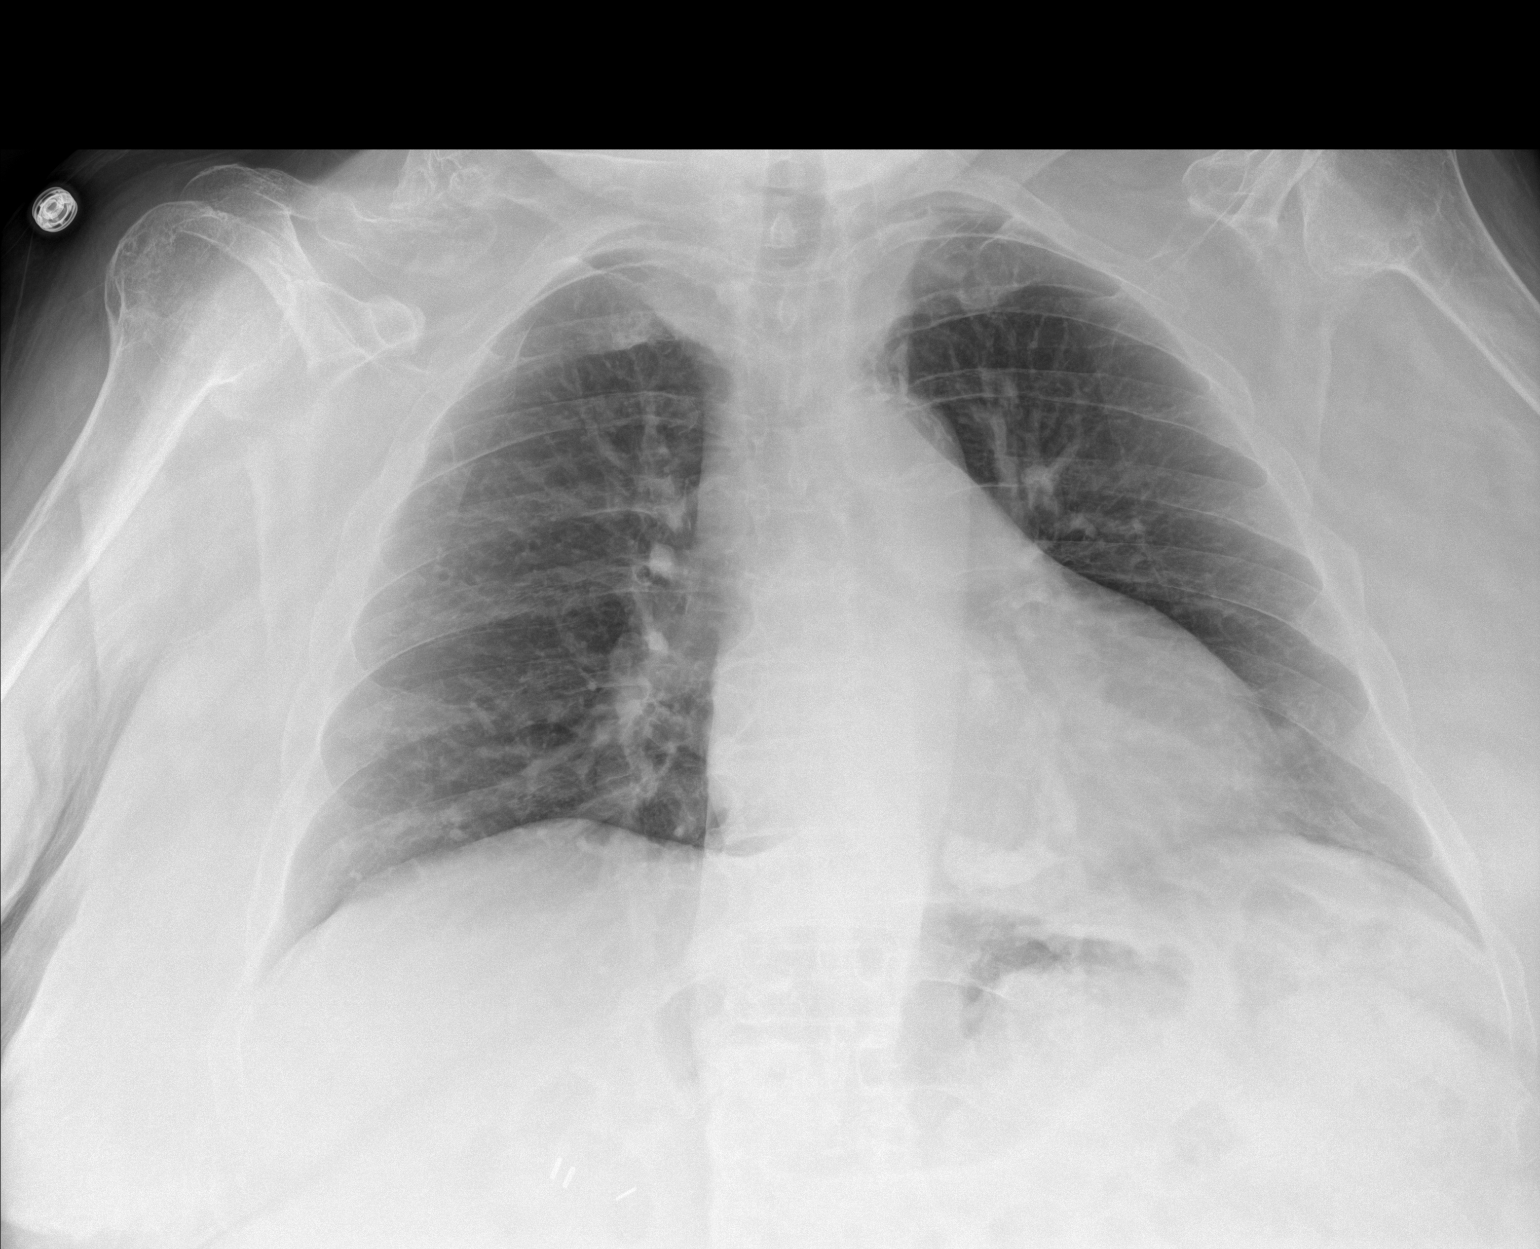

[1 of 1 positions shown; findings below may reference images not displayed]

FINDINGS: Cardiac shadow is stable. Mitral annular calcifications and aortic
calcifications are again seen and stable. The lungs are clear. No
acute bony abnormality is noted. Old non healed right clavicular
fracture is noted.
IMPRESSION: No acute abnormality noted.

## 2022-09-29 IMAGING — MR MR HEAD W/O CM
12 series · 48 of 48 positions shown · non-contrast
Comparison: 05/05/2020

CLINICAL DATA: Acute neurologic deficit

EXAM:
MRI HEAD WITHOUT CONTRAST
TECHNIQUE: Multiplanar, multiecho pulse sequences of the brain and surrounding
structures were obtained without intravenous contrast.

[Series 5: ax dwi_tracew · axial · 3.0mm · 0.65mm/px · z∈[-140,+14]mm · 4 of 48 slices shown]
[im 1/48]
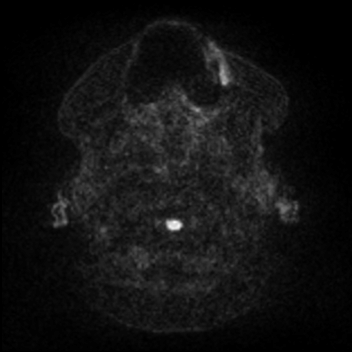
[im 16/48]
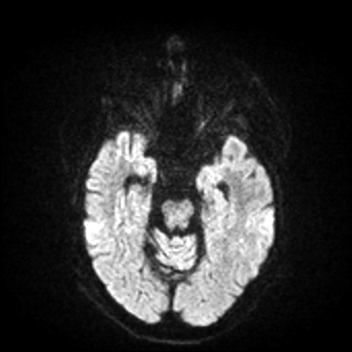
[im 32/48]
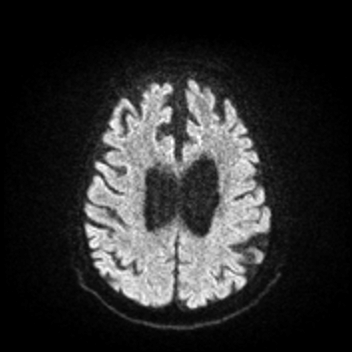
[im 48/48]
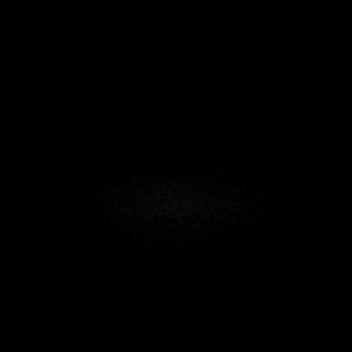

[Series 6: ax dwi_adc · axial · 3.0mm · 0.65mm/px · z∈[-140,+11]mm · 4 of 47 slices shown]
[im 1/47]
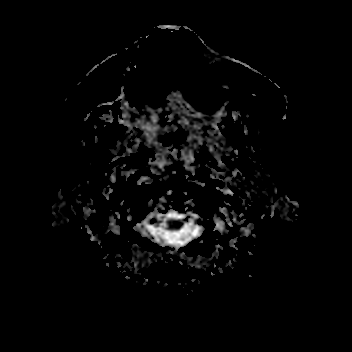
[im 16/47]
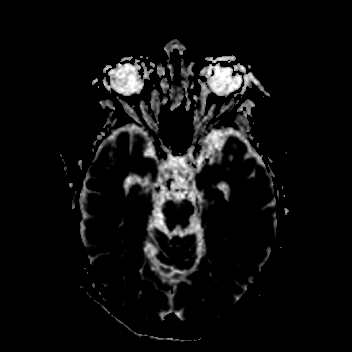
[im 31/47]
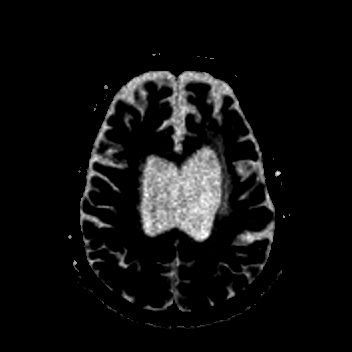
[im 47/47]
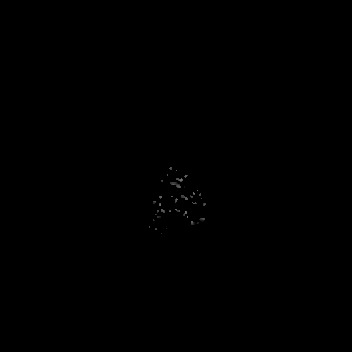

[Series 7: cor dwi_tracew · coronal · 5.0mm · 0.65mm/px · 3 of 40 slices shown]
[im 1/40]
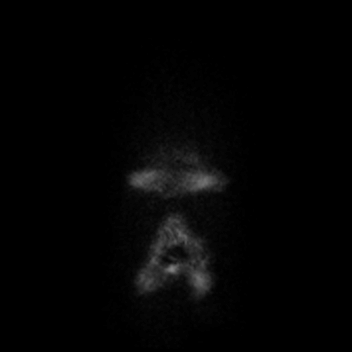
[im 20/40]
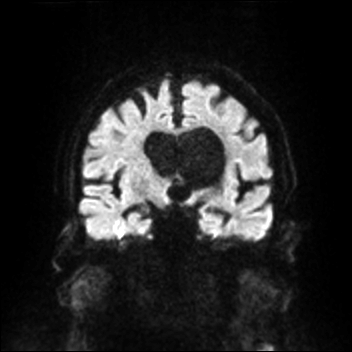
[im 40/40]
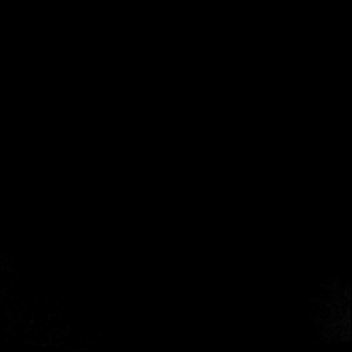

[Series 8: cor dwi_adc · coronal · 5.0mm · 0.65mm/px · 3 of 36 slices shown]
[im 1/36]
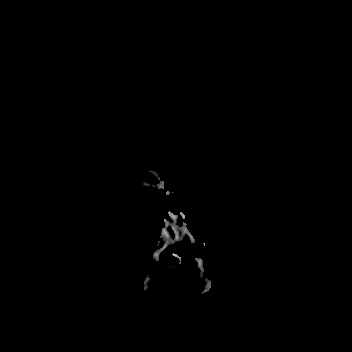
[im 18/36]
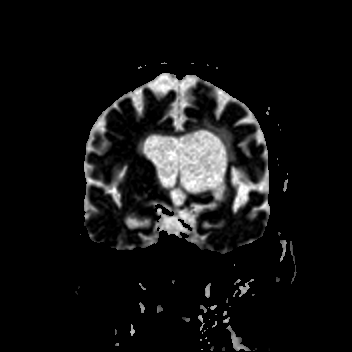
[im 36/36]
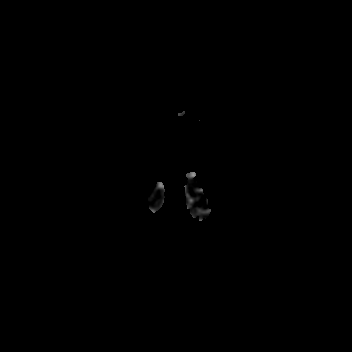

[Series 9: T1 · sagittal · 5.0mm · 0.62mm/px · 2 of 25 slices shown (1 of 3)]
[im 1/25]
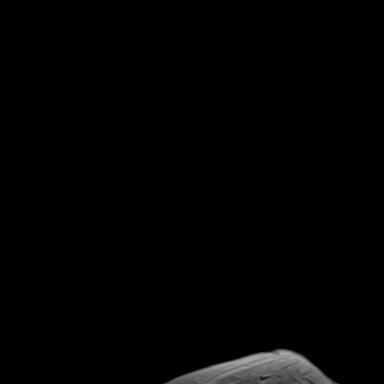
[im 25/25]
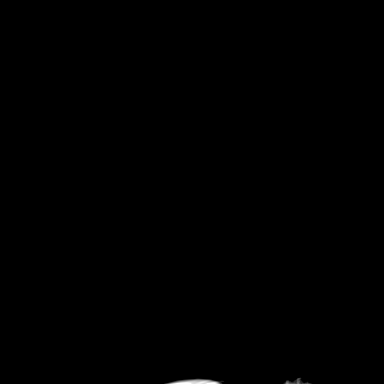

[Series 10: T2 · axial · 5.0mm · 0.53mm/px · z∈[-134,+9]mm · 2 of 25 slices shown (1 of 2)]
[im 1/25]
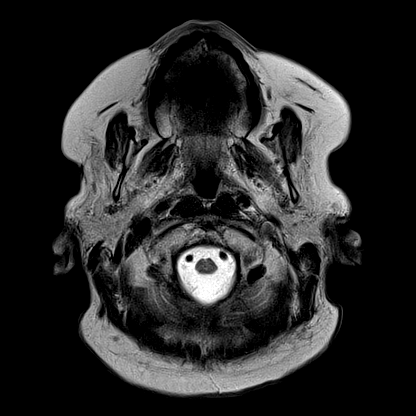
[im 25/25]
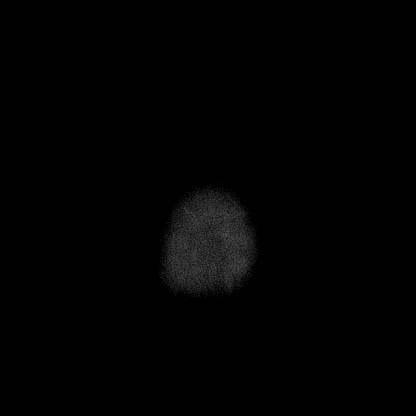

[Series 12: pha_images · axial · 3.0mm · 0.90mm/px · z∈[-150,+23]mm · 4 of 58 slices shown]
[im 1/58]
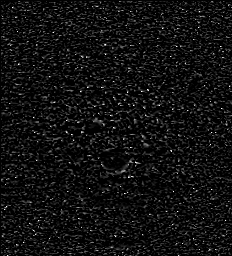
[im 20/58]
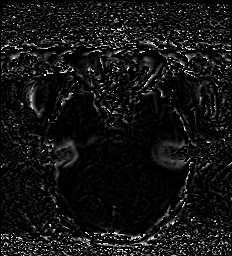
[im 39/58]
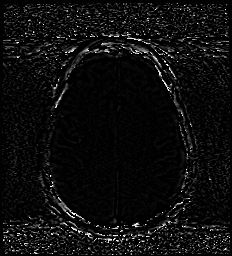
[im 58/58]
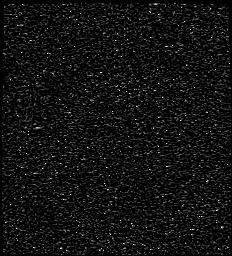

[Series 13: swi_images · axial · 3.0mm · 0.90mm/px · z∈[-150,+26]mm · 5 of 60 slices shown]
[im 1/60]
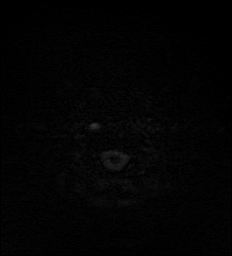
[im 15/60]
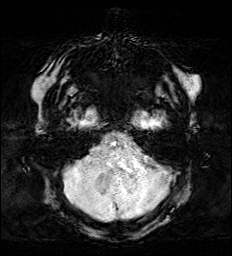
[im 30/60]
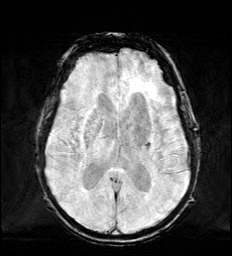
[im 45/60]
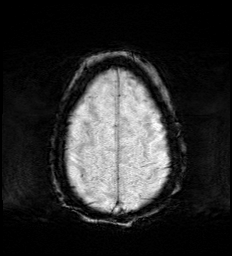
[im 60/60]
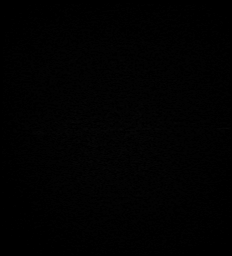

[Series 15: FLAIR · axial · 3.0mm · 0.53mm/px · z∈[-143,+18]mm · 4 of 55 slices shown]
[im 1/55]
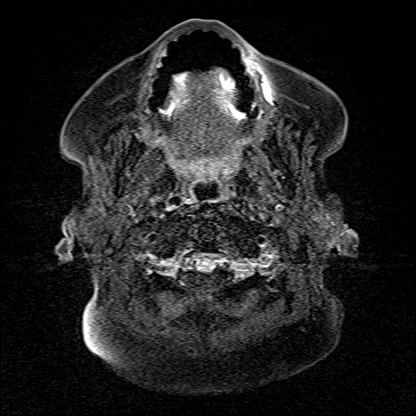
[im 19/55]
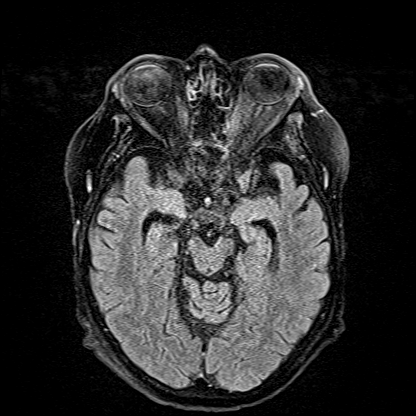
[im 37/55]
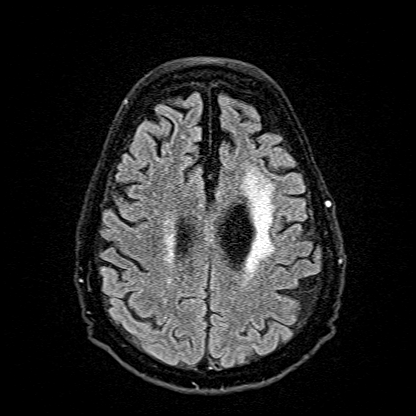
[im 55/55]
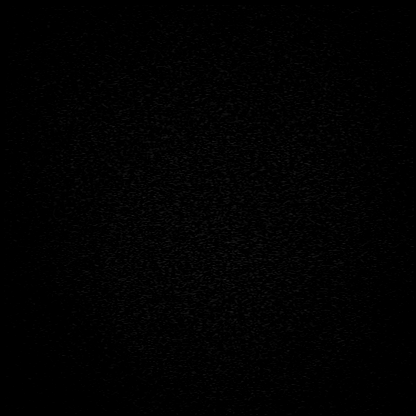

[Series 16: T1 · axial · 1.0mm · 0.98mm/px · z∈[-152,+23]mm · 13 of 176 slices shown (2 of 3)]
[im 1/176]
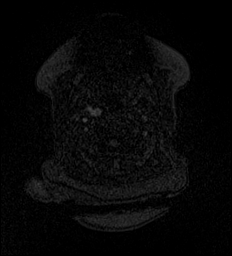
[im 15/176]
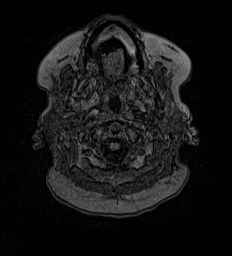
[im 30/176]
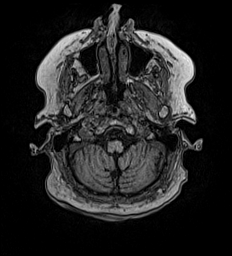
[im 44/176]
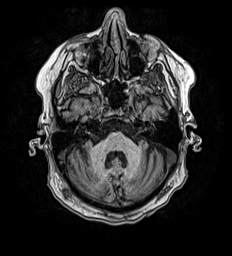
[im 59/176]
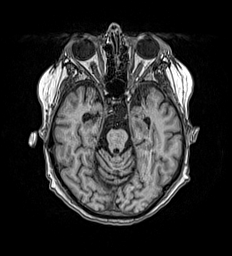
[im 73/176]
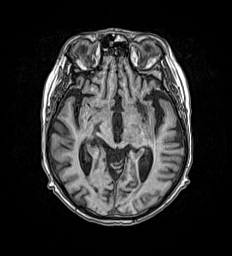
[im 88/176]
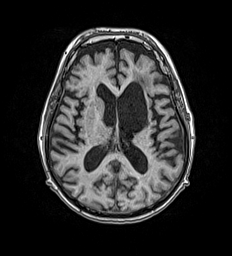
[im 103/176]
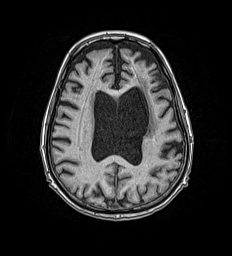
[im 117/176]
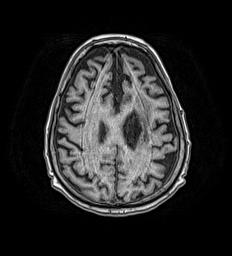
[im 132/176]
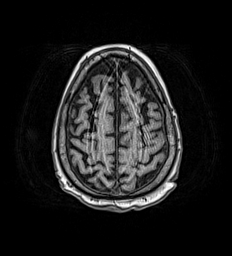
[im 146/176]
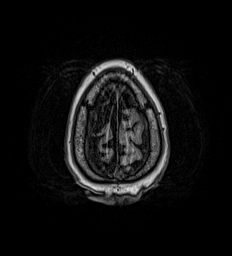
[im 161/176]
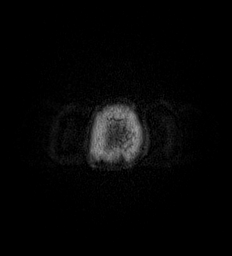
[im 176/176]
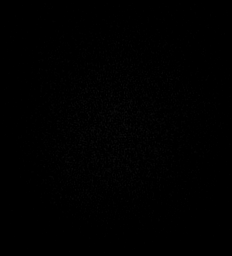

[Series 17: T2 · coronal · 5.0mm · 0.57mm/px · 2 of 29 slices shown (2 of 2)]
[im 1/29]
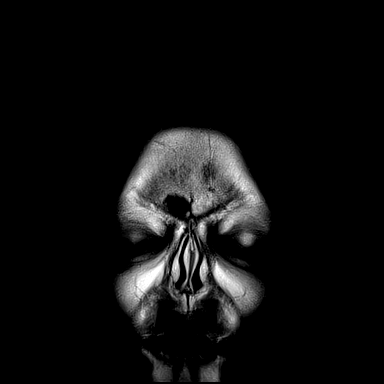
[im 29/29]
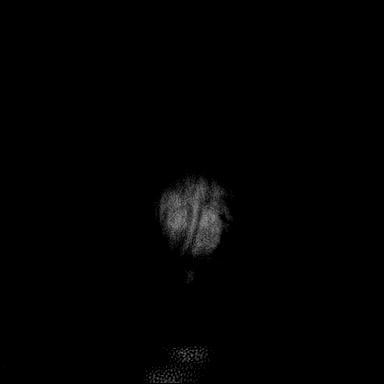

[Series 18: T1 · axial · 5.0mm · 0.90mm/px · z∈[-141,+15]mm · 2 of 27 slices shown (3 of 3)]
[im 1/27]
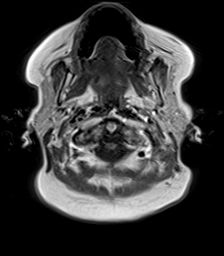
[im 27/27]
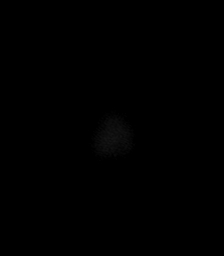

[48 of 48 positions shown; findings below may reference images not displayed]

FINDINGS: Brain: No acute infarct, mass effect or extra-axial collection. No
acute or chronic hemorrhage. Left frontal encephalomalacia with ex
vacuo dilatation of the frontal horn of left lateral ventricle.
Generalized volume loss. The midline structures are normal.

Vascular: Major flow voids are preserved.

Skull and upper cervical spine: Normal calvarium and skull base.
Visualized upper cervical spine and soft tissues are normal.

Sinuses/Orbits:No paranasal sinus fluid levels or advanced mucosal
thickening. No mastoid or middle ear effusion. Normal orbits.
IMPRESSION: 1. No acute intracranial abnormality.
2. Multiple old infarcts, left frontal encephalomalacia with ex
vacuo dilatation of the frontal horn of the left lateral ventricle.

## 2022-10-01 IMAGING — US US RENAL
1 series · 14 of 25 positions shown · non-contrast
Comparison: 10/15/2016 CTA

CLINICAL DATA: Acute kidney injury

EXAM:
RENAL / URINARY TRACT ULTRASOUND COMPLETE

[Series 1: us renal · 14 of 32 slices shown]
[im 1/32]
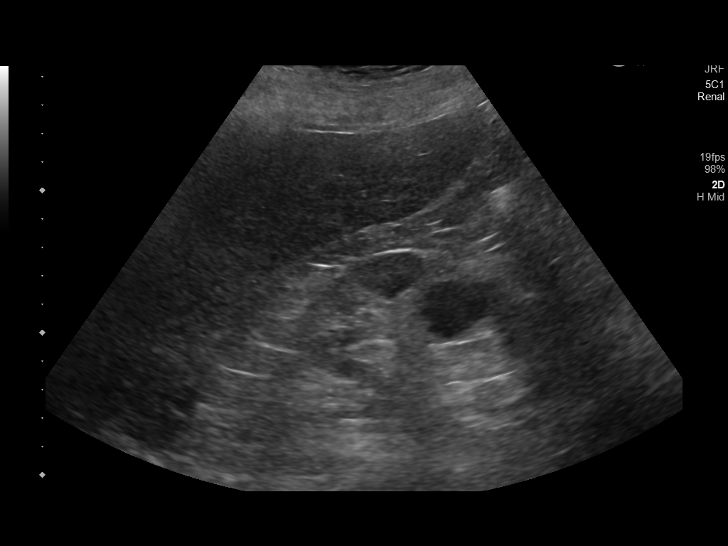
[im 3/32]
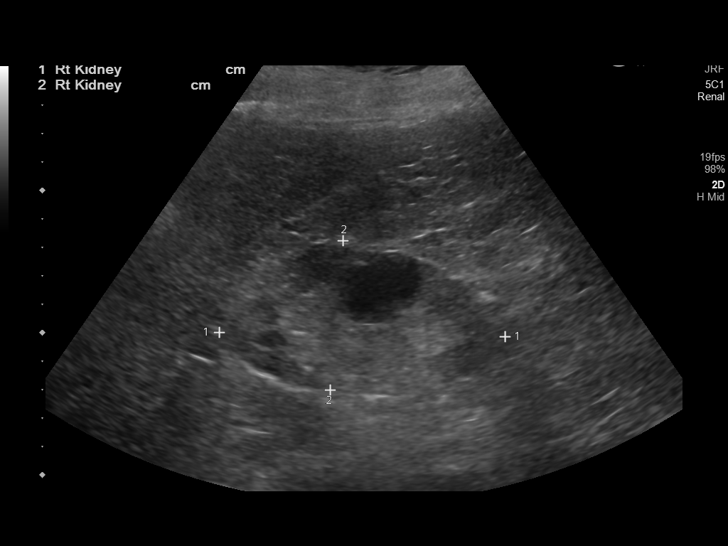
[im 6/32]
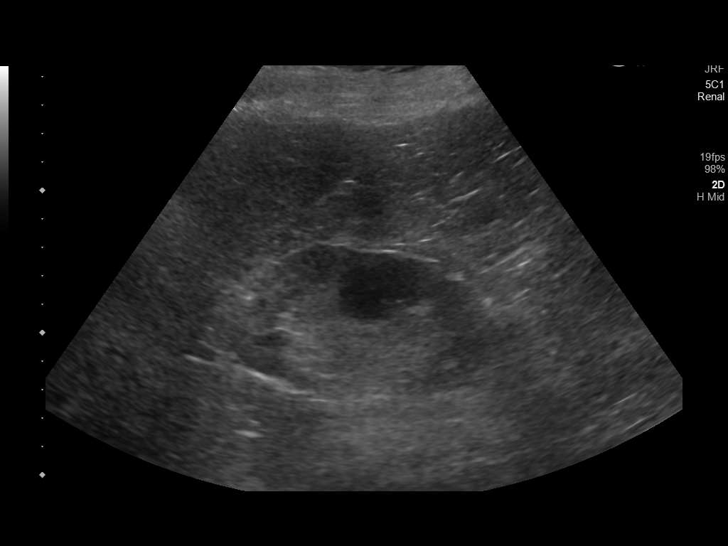
[im 8/32]
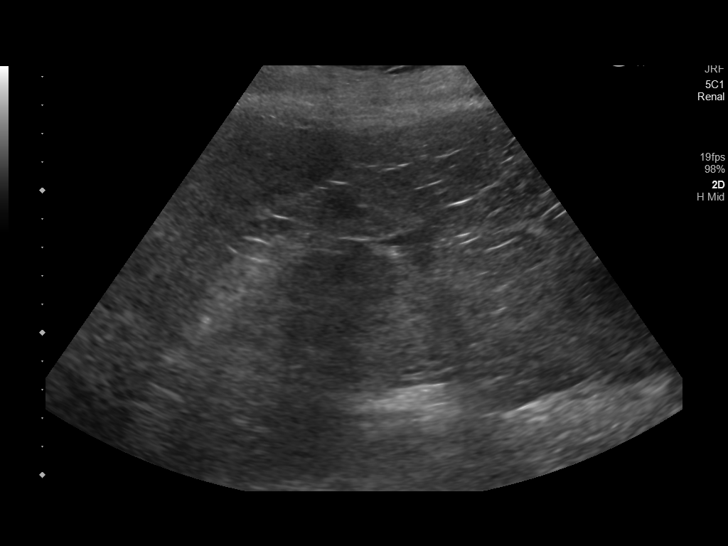
[im 11/32]
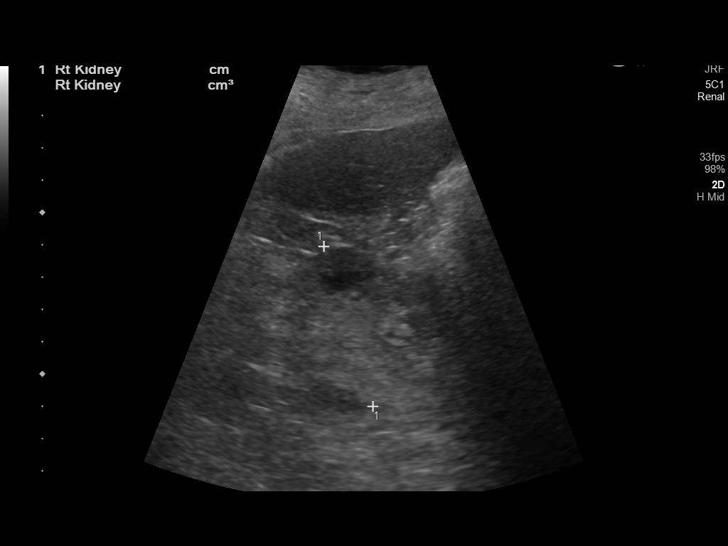
[im 12/32]
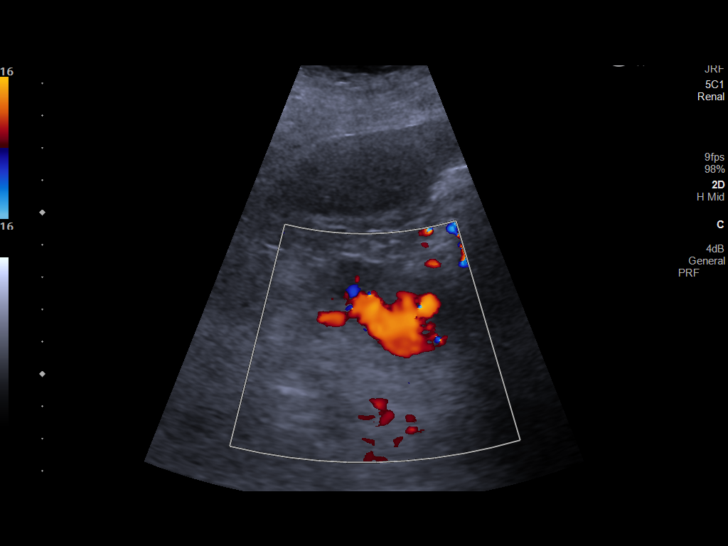
[im 15/32]
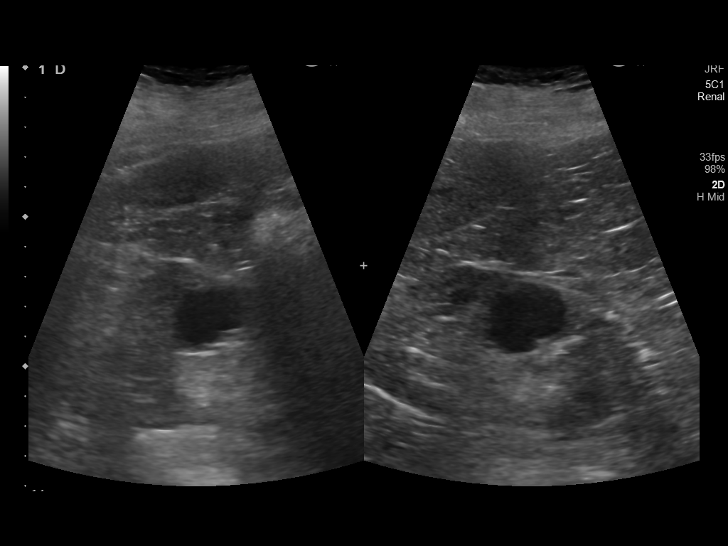
[im 17/32]
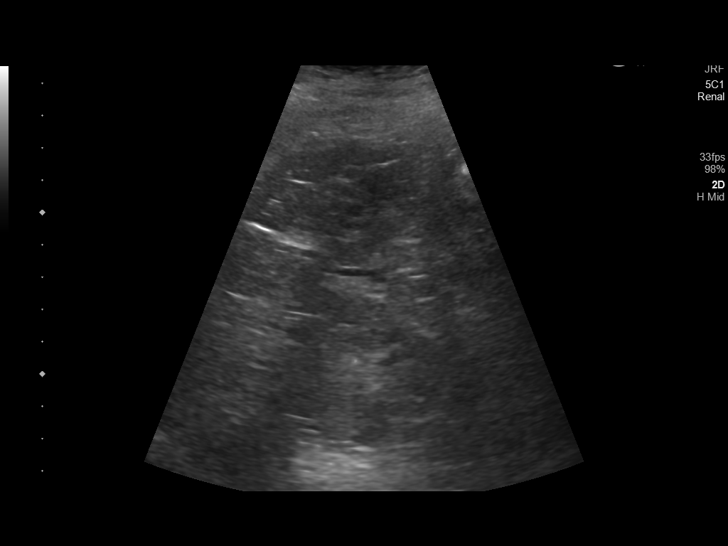
[im 20/32]
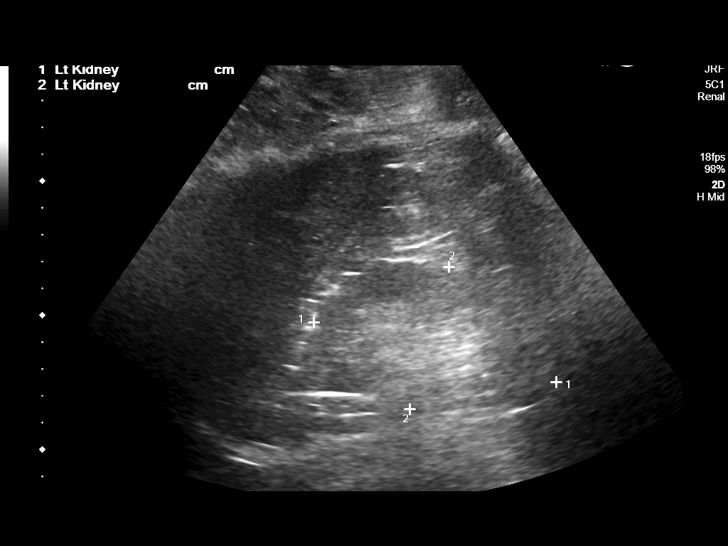
[im 21/32]
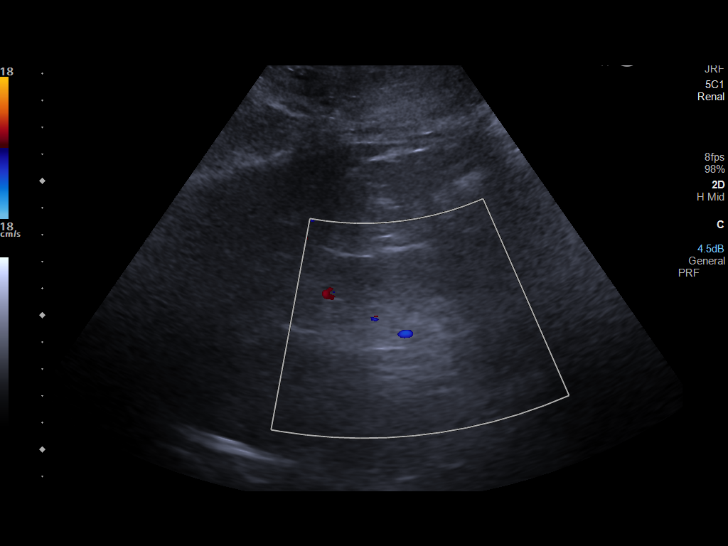
[im 24/32]
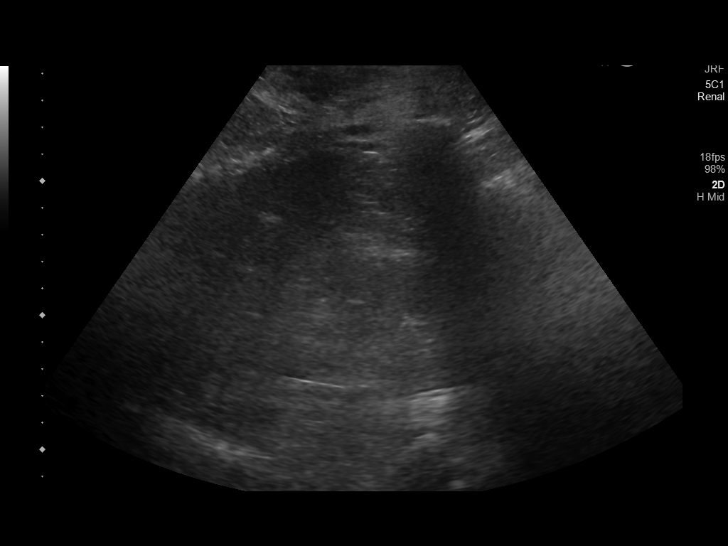
[im 26/32]
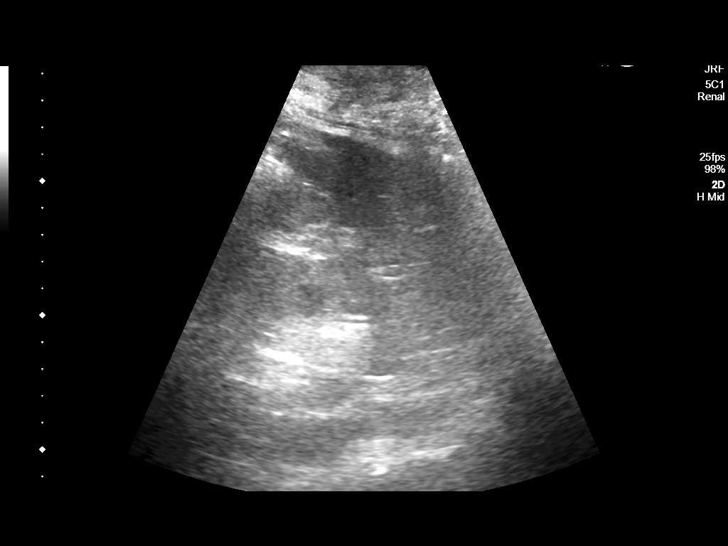
[im 29/32]
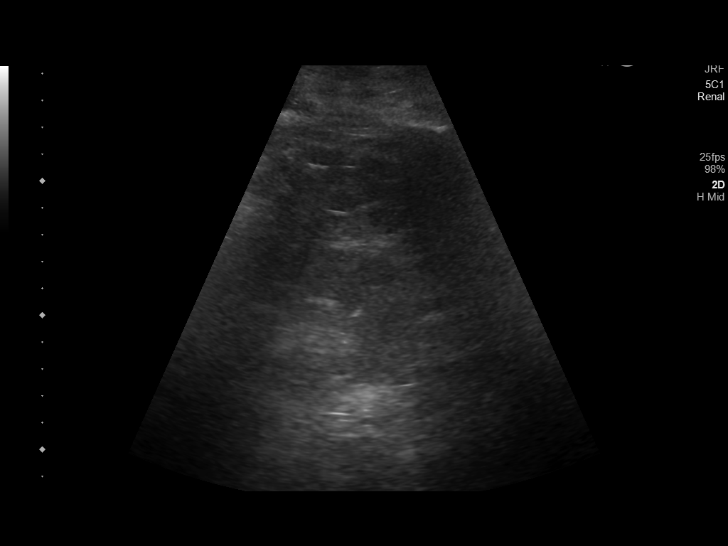
[im 32/32]
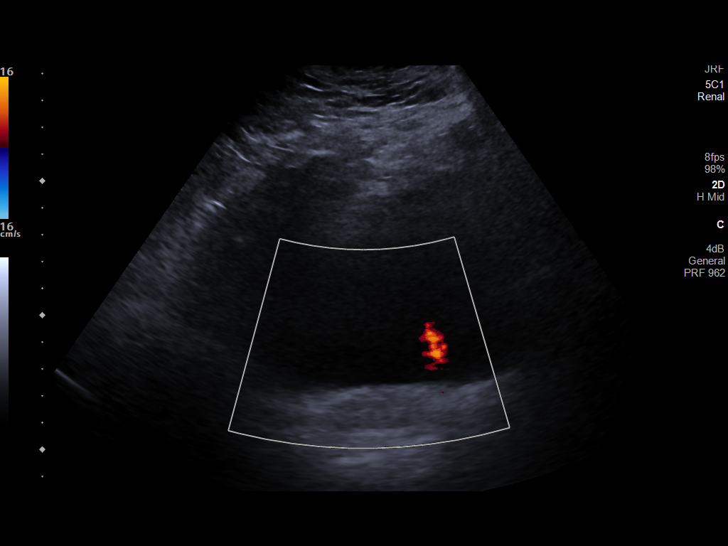

[14 of 25 positions shown; findings below may reference images not displayed]

FINDINGS: Right Kidney:

Renal measurements: 10.1 x 5.3 x 5.2 cm = volume: 144 mL. slight
increased echogenicity compatible with medical renal disease. Mid
pole anechoic cortical cyst measures 2.7 cm. No hydronephrosis or
acute finding.

Left Kidney:

Renal measurements: 9.3 x 5.5 x 6.1 cm = volume: 162 mL. Slight
increased cortical echogenicity. No focal abnormality or
hydronephrosis. No acute finding.

Bladder:

Appears normal for degree of bladder distention. Left ureteral jet
demonstrated.

Other:

No free fluid.
IMPRESSION: Increased renal echogenicity compatible with medical renal disease.

2.7 cm right kidney midpole cortical cyst

No acute finding by ultrasound

## 2022-10-21 IMAGING — CT CT ABD-PELV W/O CM
2 of 5 series · 15 of 46 positions shown, 17 images · non-contrast
Comparison: Renal ultrasound earlier today. Abdominopelvic CT
10/15/2016

CLINICAL DATA: Urologic cancer, surveillance cycstic mass

Cystic mass seen on renal ultrasound.
EXAM:
CT ABDOMEN AND PELVIS WITHOUT CONTRAST
TECHNIQUE: Multidetector CT imaging of the abdomen and pelvis was performed
following the standard protocol without IV contrast.

[Series 2: axial st · axial · 0.82mm/px · z∈[-1245,-810]mm · 12 of 103 slices shown, 14 images]
[im 8/103  soft-tissue]
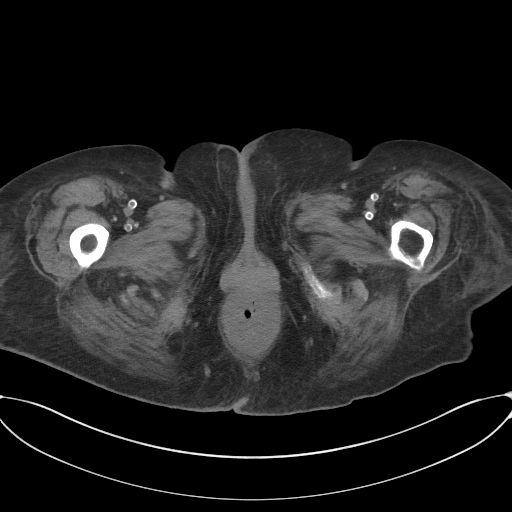
[im 8/103  bone]
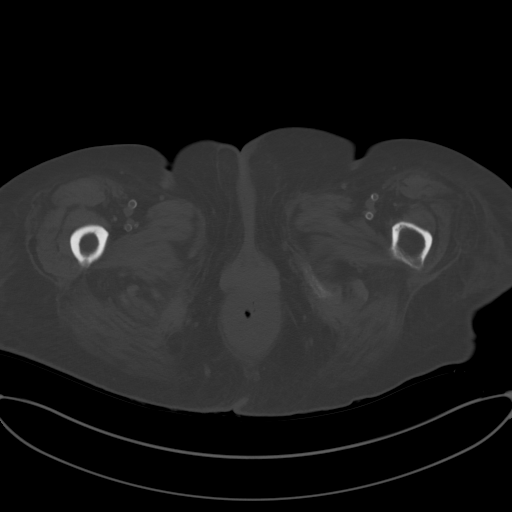
[im 16/103  soft-tissue]
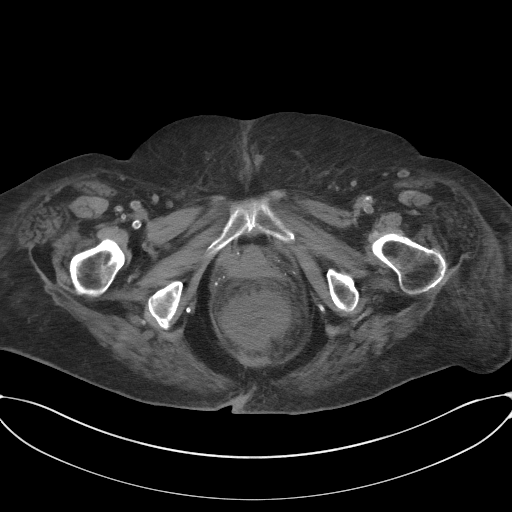
[im 24/103  soft-tissue]
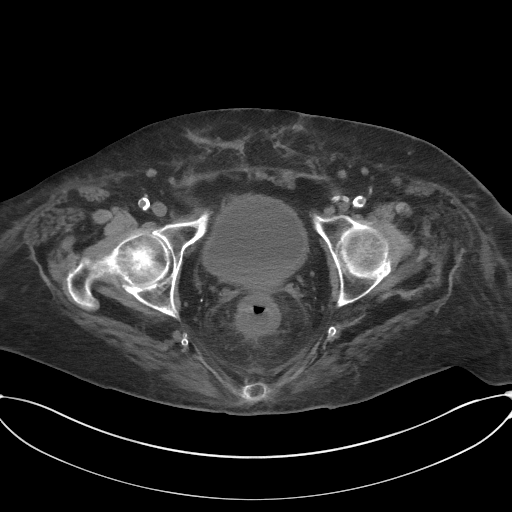
[im 32/103  soft-tissue]
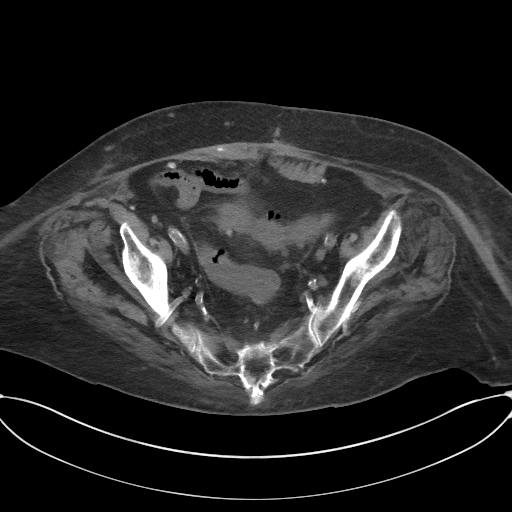
[im 40/103  soft-tissue]
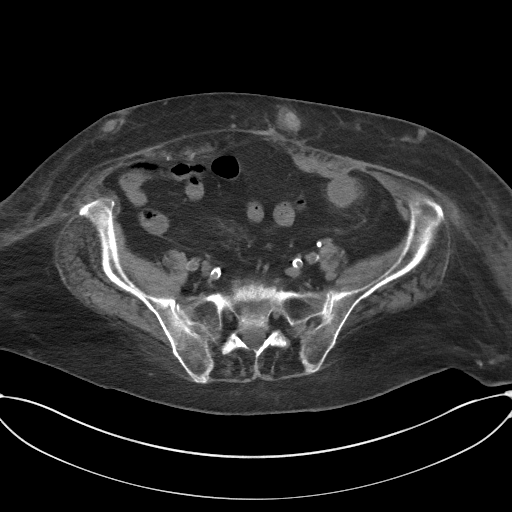
[im 48/103  soft-tissue]
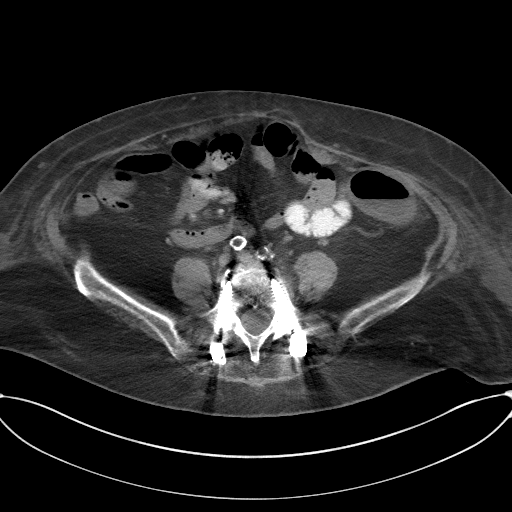
[im 55/103  soft-tissue]
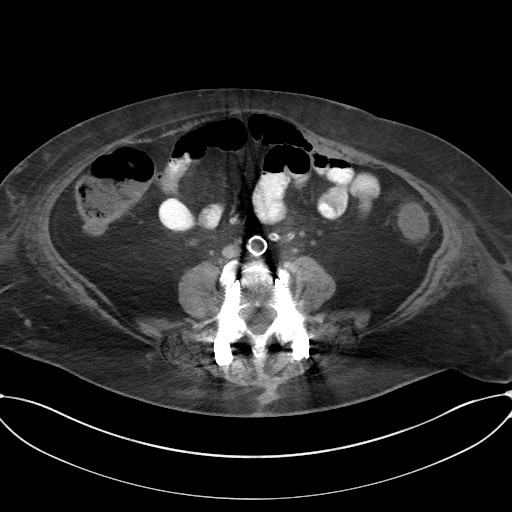
[im 63/103  soft-tissue]
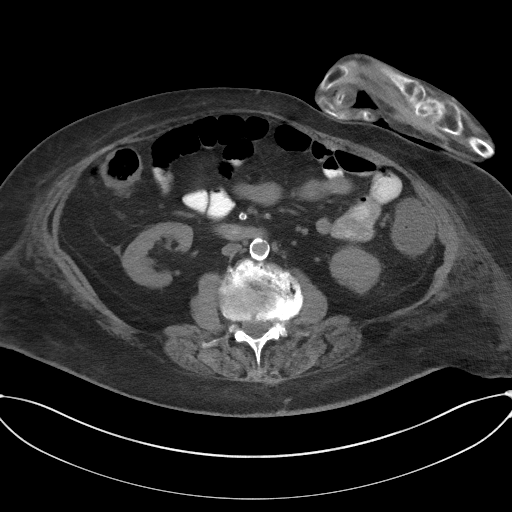
[im 71/103  soft-tissue]
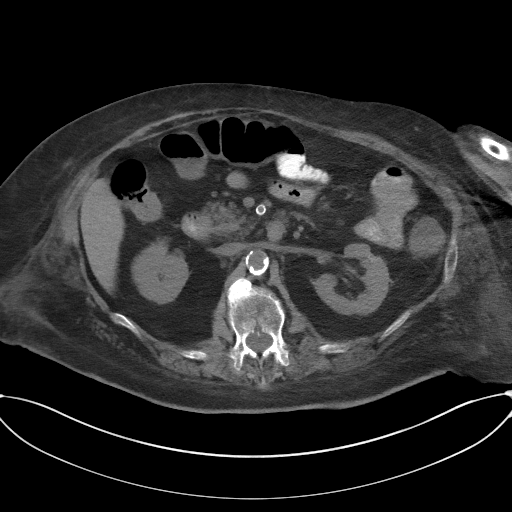
[im 71/103  bone]
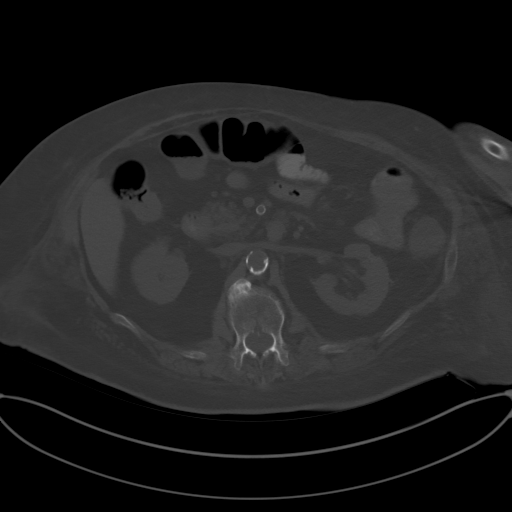
[im 79/103  soft-tissue]
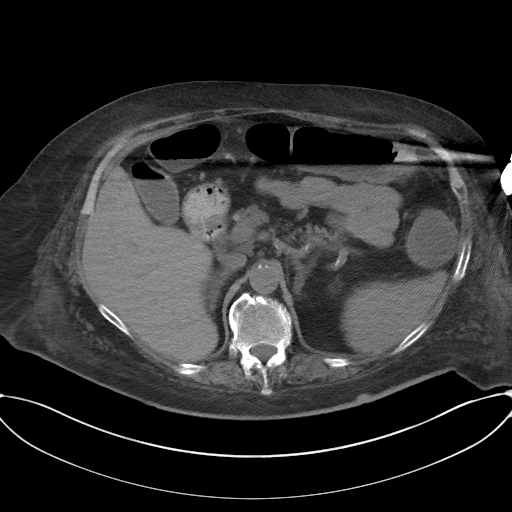
[im 87/103  soft-tissue]
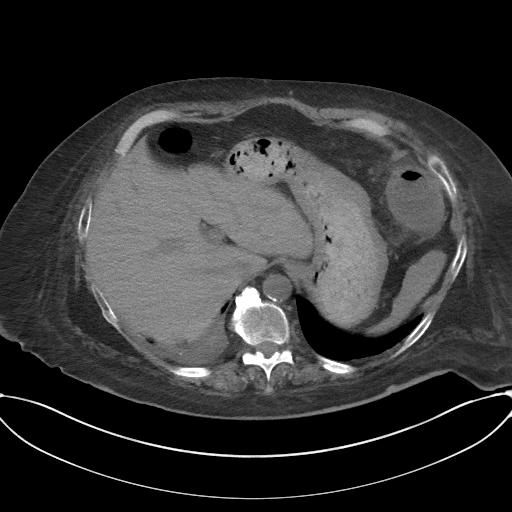
[im 95/103  soft-tissue]
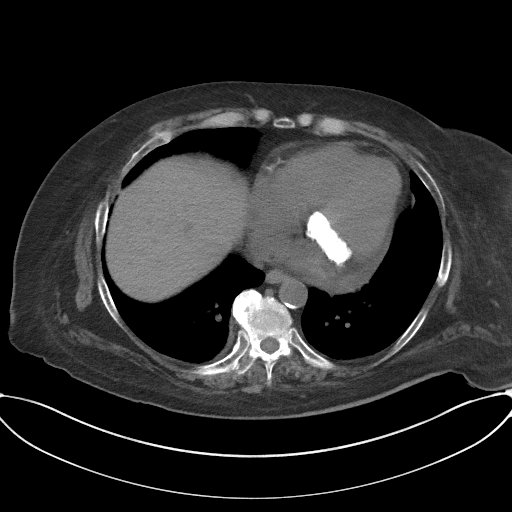

[Series 5: coronal st · coronal · 0.99mm/px · 3 of 94 slices shown]
[im 32/94  soft-tissue]
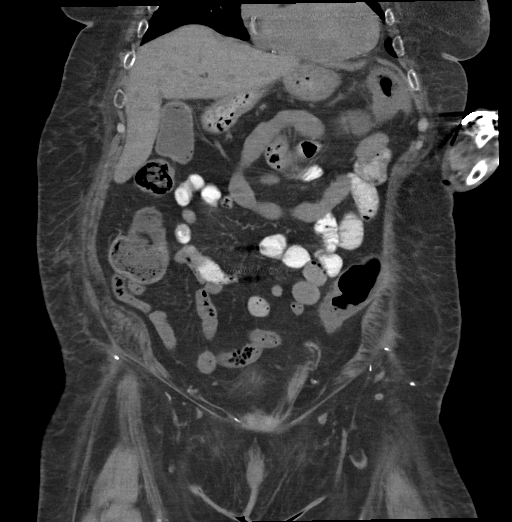
[im 42/94  soft-tissue]
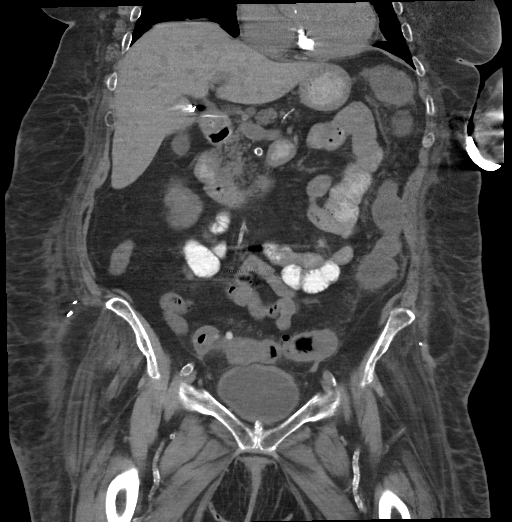
[im 52/94  soft-tissue]
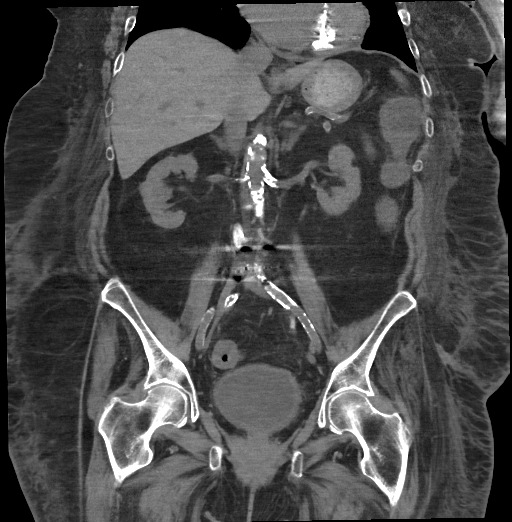

[15 of 46 positions shown; findings below may reference images not displayed]

FINDINGS: Lower chest: The heart is enlarged. Dense mitral annulus
calcifications. There are coronary artery calcifications. Small
right pleural effusion. No confluent basilar consolidation.

Hepatobiliary: No evidence of focal liver abnormality on this
noncontrast exam. Cholecystectomy without biliary dilatation.

Pancreas: Fatty atrophy about the head and uncinate process. No
ductal dilatation or inflammation.

Spleen: Normal in size without focal abnormality.

Adrenals/Urinary Tract: Mild adrenal thickening without dominant
nodule. Mild bilateral renal parenchymal thinning with cortical
scarring in the mid left kidney. No hydronephrosis. Slight
perinephric edema. Renal cyst cyst seen on ultrasound. There is no
evidence of suspicious renal lesion. No renal calculi. Decompressed
ureters. Bladder wall thickening with minimal layering debris. Mild
perivesicular fat stranding.

Stomach/Bowel: The stomach is unremarkable. Administered enteric
contrast reaches the mid small bowel. There is no small bowel
obstruction or small bowel inflammation. Appendix is not visualized,
no appendicitis. Fluid-filled hepatic flexure, transverse, and
distal colon. There is associated colonic wall thickening at the
splenic flexure, descending, and sigmoid colon with mild pericolonic
edema. Sigmoid colonic diverticulosis without focal diverticulitis.
Circumferential rectal wall thickening with moderate perirectal
edema. There is no bowel pneumatosis or perforation.

Vascular/Lymphatic: Advanced aortic and branch atherosclerosis. No
aortic aneurysm. Small retroperitoneal lymph nodes, not enlarged by
size criteria.

Reproductive: Uterus is surgically absent.  No adnexal mass.

Other: There is no intra-abdominopelvic mass or cyst corresponding
to that seen on recent ultrasound. No ascites or free fluid. No free
air. No focal fluid collection. There is a small fat containing
umbilical hernia. Scattered subcutaneous edema of the subcutaneous
tissues. Postsurgical change of the lower abdominal wall.

Musculoskeletal: L4-L5 posterior lumbar fusion. Bones diffusely
under mineralized. Degenerative change in the lumbar spine.
Prominent Schmorl's node in the superior endplate of L1.
IMPRESSION: 1. No intra-abdominopelvic cyst or cystic mass to correlate with
recent ultrasound.
2. Colonic wall thickening with pericolonic edema at the splenic
flexure, descending, and sigmoid colon, consistent with colitis.
Sigmoid colonic diverticulosis without focal diverticulitis.
3. Prominent rectal wall thickening and perirectal edema.
4. Bladder wall thickening with minimal layering debris, can be seen
with urinary tract renal cysts, as seen on recent ultrasound.
5. Small right pleural effusion.
6. Advanced aortic and branch atherosclerosis.

Aortic Atherosclerosis ([id number]).

## 2022-10-21 IMAGING — US US RENAL ARTERY STENOSIS
1 series · 13 of 25 positions shown · non-contrast
Comparison: Ultrasound kidneys 12/16/2020

CLINICAL DATA: Renal vascular hypertension

EXAM:
RENAL/URINARY TRACT ULTRASOUND
RENAL DUPLEX DOPPLER ULTRASOUND

[Series 1: us renal artery duplex complete · 13 of 72 slices shown]
[im 1/72]
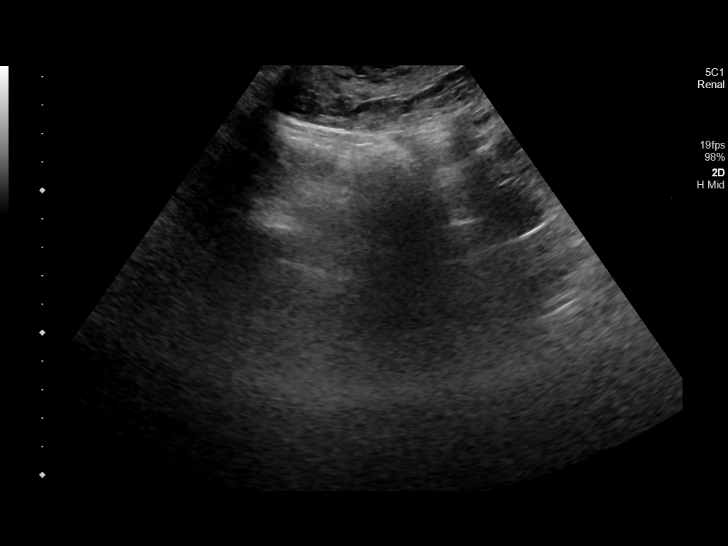
[im 6/72]
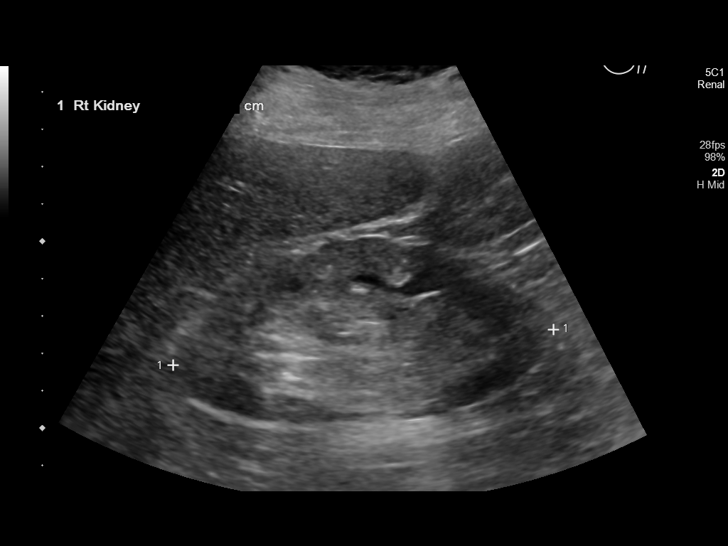
[im 12/72]
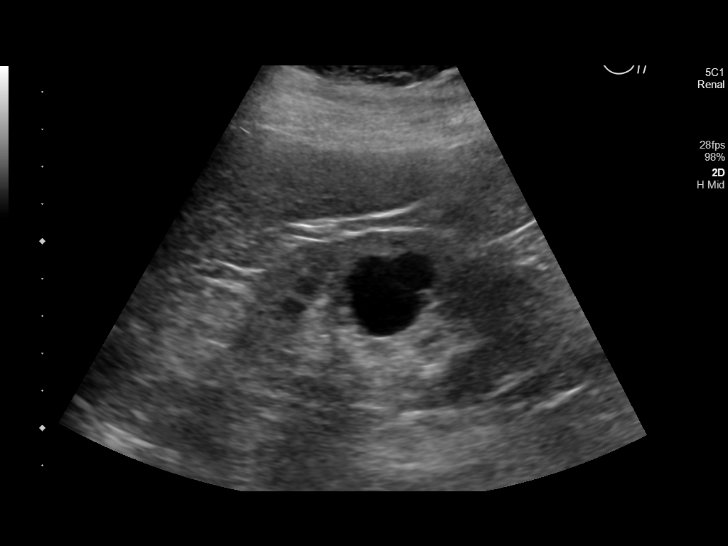
[im 18/72]
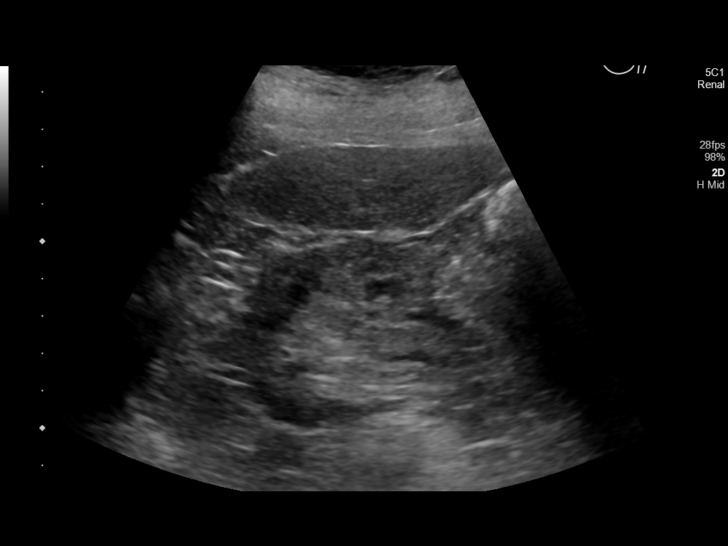
[im 24/72]
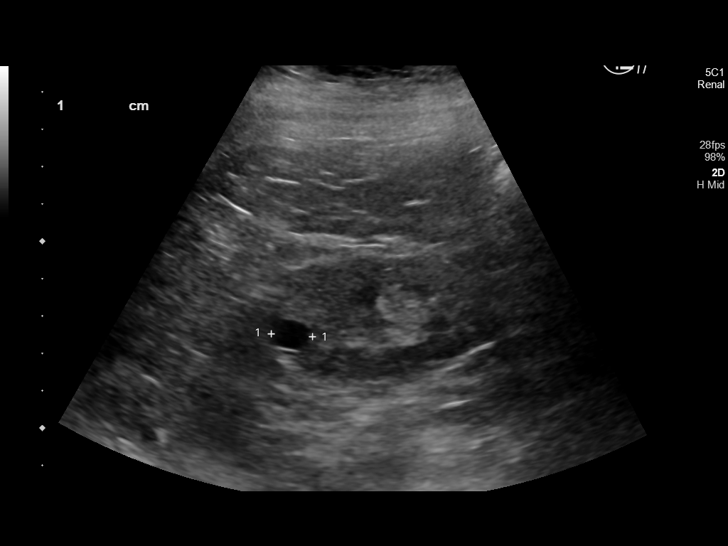
[im 30/72]
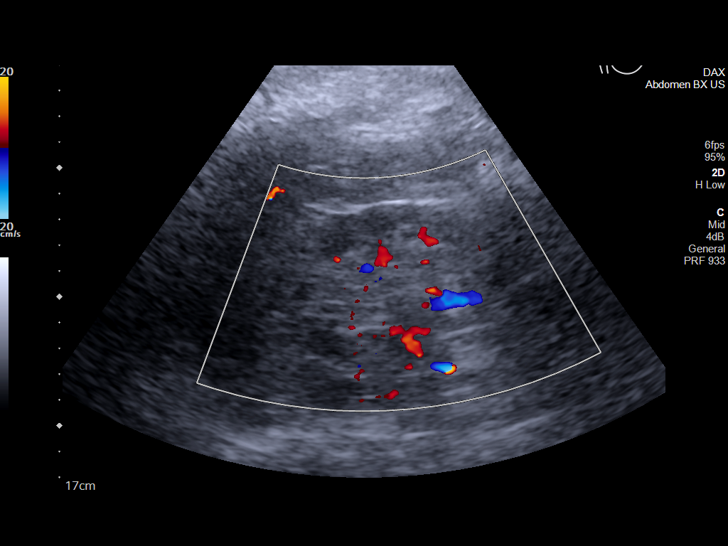
[im 36/72]
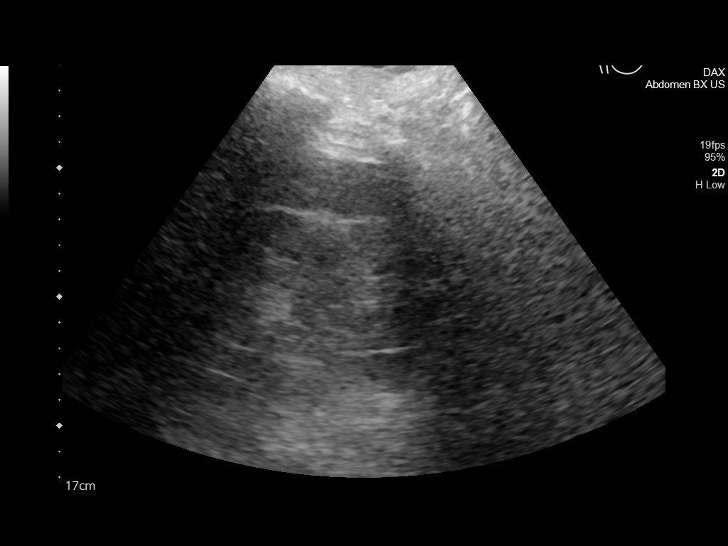
[im 42/72]
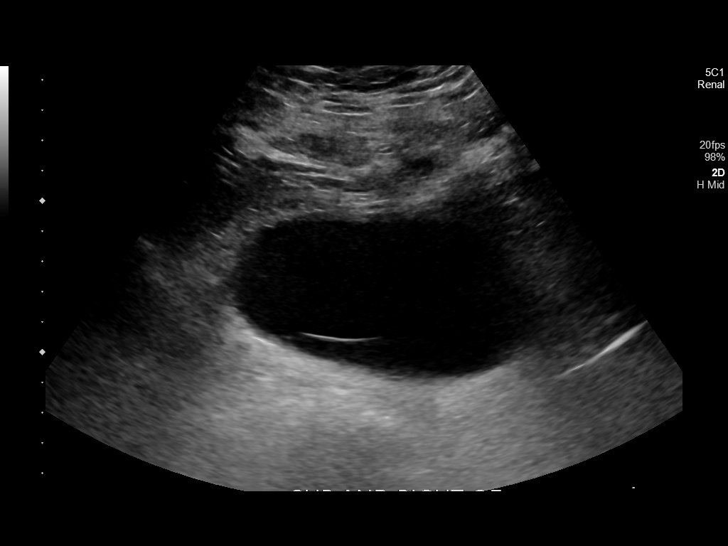
[im 48/72]
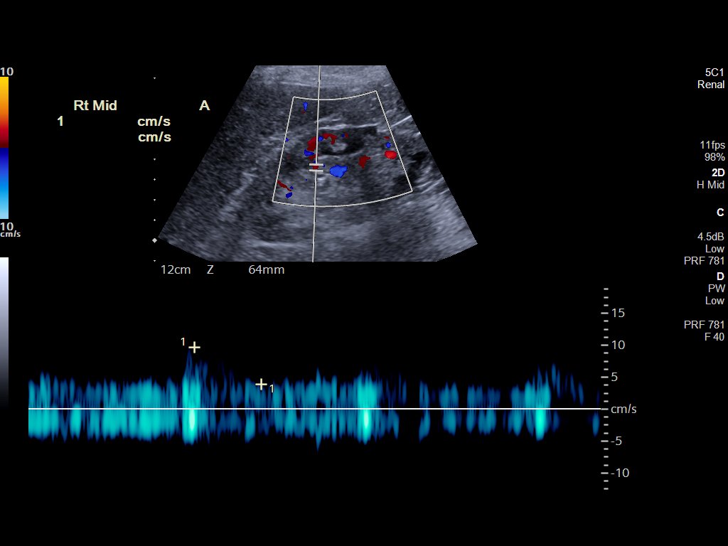
[im 54/72]
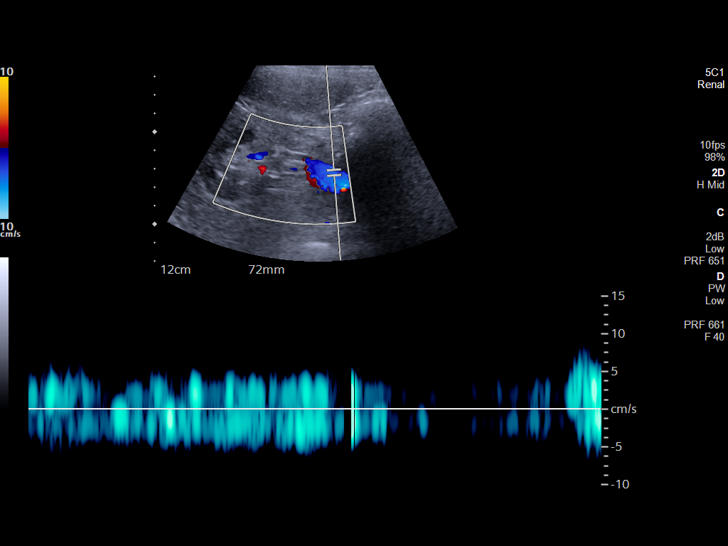
[im 60/72]
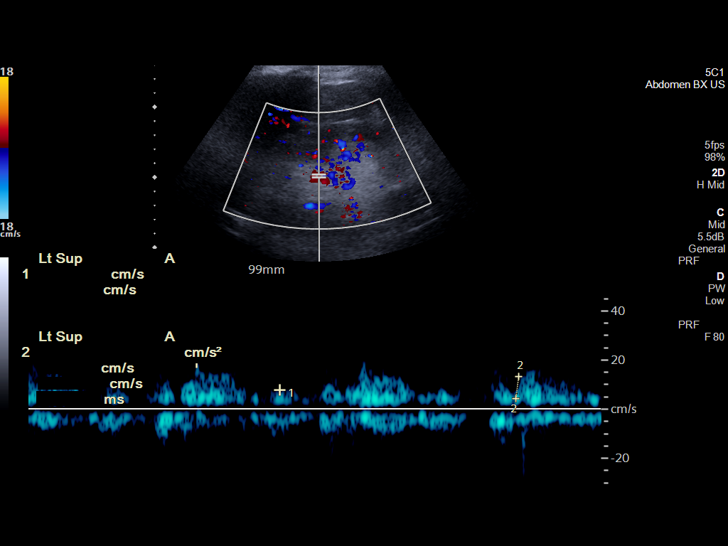
[im 66/72]
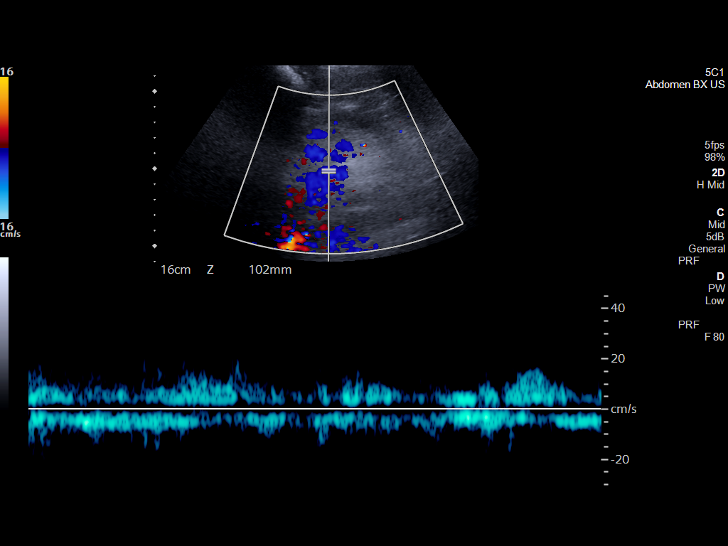
[im 72/72]
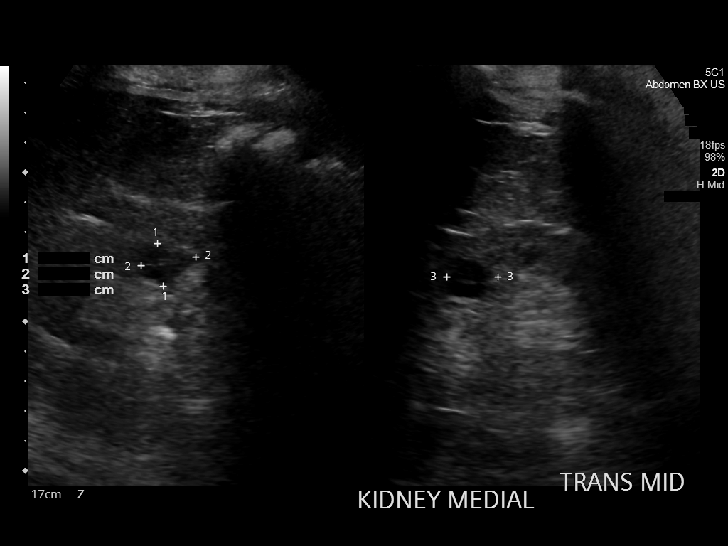

[13 of 25 positions shown; findings below may reference images not displayed]

FINDINGS: Right Kidney:

Length: 10.2 cm. Mild increased cortical echogenicity without
significant thinning. No mass or hydronephrosis. Two simple right
renal cysts with the larger measuring 2.4 cm.

Left Kidney:

Length: 11.0 cm. Mild increased cortical echogenicity without
significant thinning. No mass or hydronephrosis. Septated cyst noted
in the mid left kidney measuring 1.9 cm.

Bladder:  Not visualized.

RENAL DUPLEX ULTRASOUND

Right Renal Artery Velocities:

Origin:  86 cm/sec

Mid:  66 cm/sec

Hilum:  28 cm/sec

Interlobar:  10 cm/sec

Arcuate:  11 cm/sec

Left Renal Artery Velocities:

Origin:  57 cm/sec

Mid:  69 cm/sec

Hilum:  24 cm/sec

Interlobar:  19 cm/sec

Arcuate:  16 cm/sec

Aortic Velocity: Unable to visualize aorta due to patient body
habitus and shadowing bowel gas.

Right Renal-Aortic Ratios:

Unable to obtain as the aorta could not be visualized.

Left Renal-Aortic Ratios:

Unable to obtained as the aorta could not be visualized.
IMPRESSION: 1. Renal artery velocities within normal limits.
2. Nonvisualization of the abdominal aorta, therefore renal
artery-aorta ratios could not be measured.
3. Partially visualized cystic mass noted in the right abdomen
measuring 10.8 x 5.6 cm is of uncertain etiology. Further evaluation
with CT of the abdomen and pelvis should be performed to better
evaluate this structure.

These results will be called to the ordering clinician or
representative by the Radiologist Assistant, and communication
documented in the PACS or [[email or web address]].

## 2022-11-04 IMAGING — DX DG CHEST 1V PORT
2 series · 2 of 2 positions shown · non-contrast
Comparison: 11/29/2020

CLINICAL DATA: Chest pain altered

EXAM:
PORTABLE CHEST 1 VIEW

[chest ap (1 of 2)]
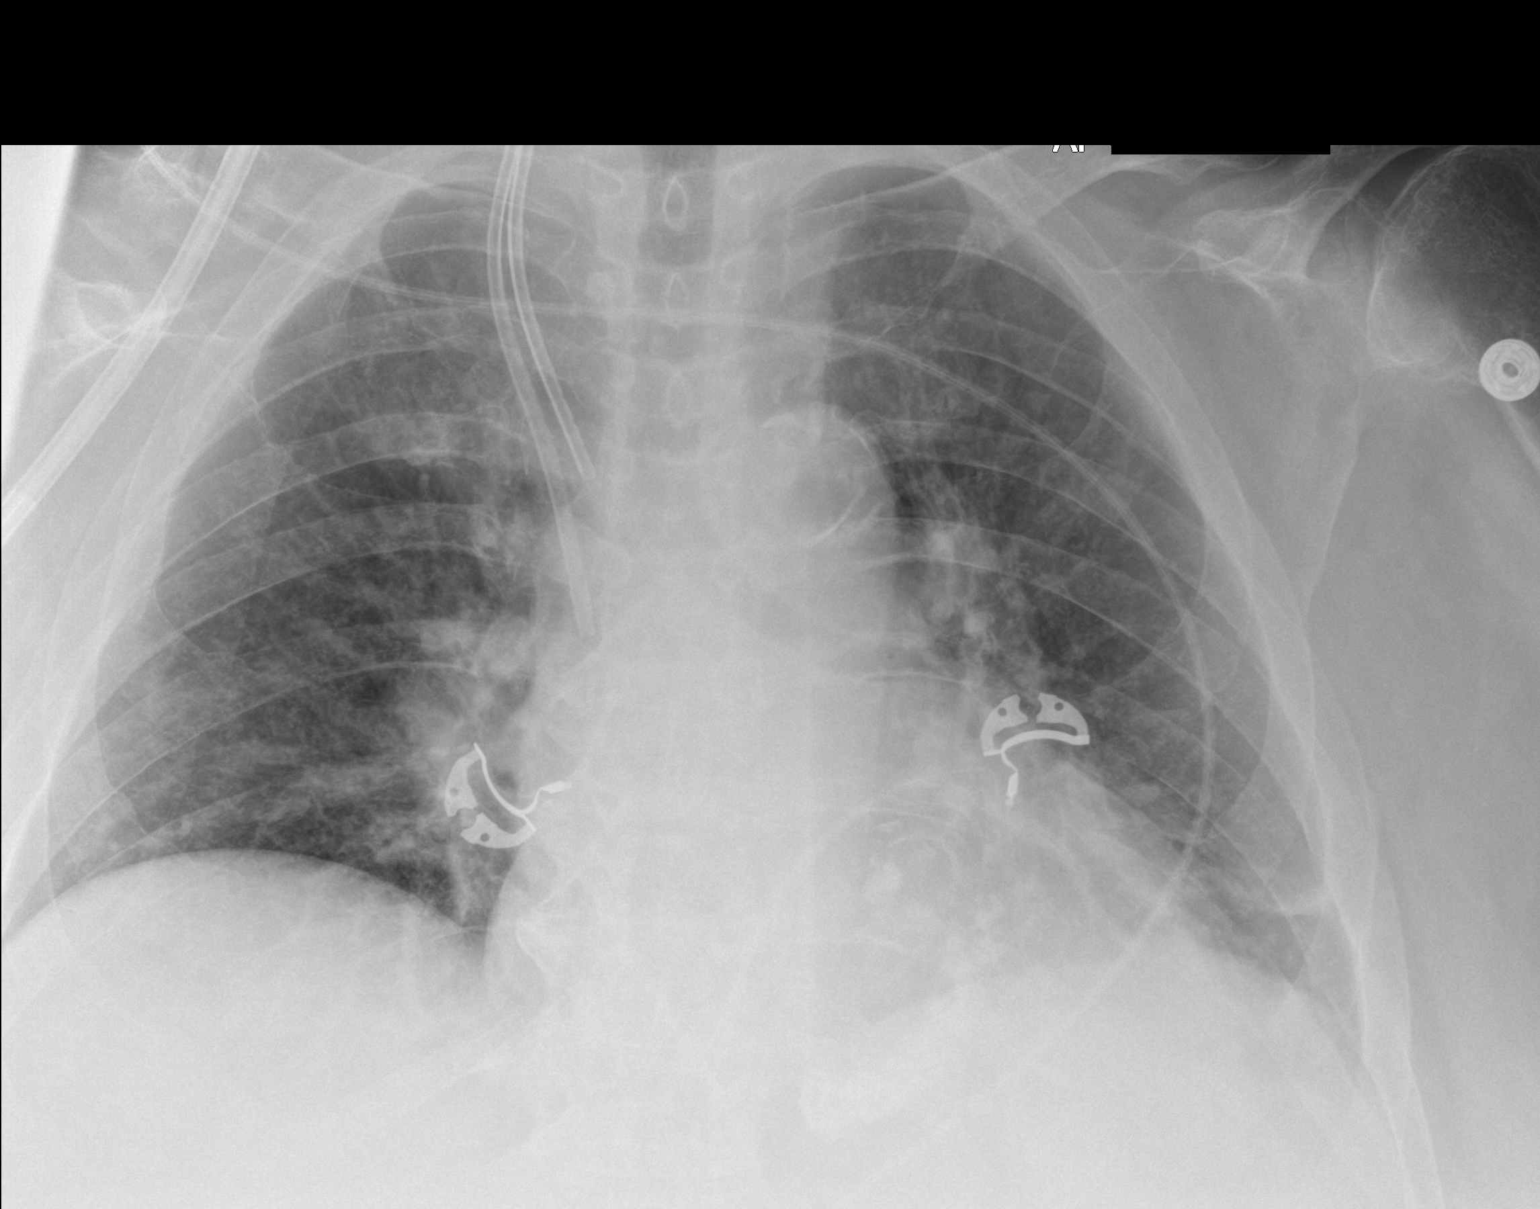

[chest ap (2 of 2)]
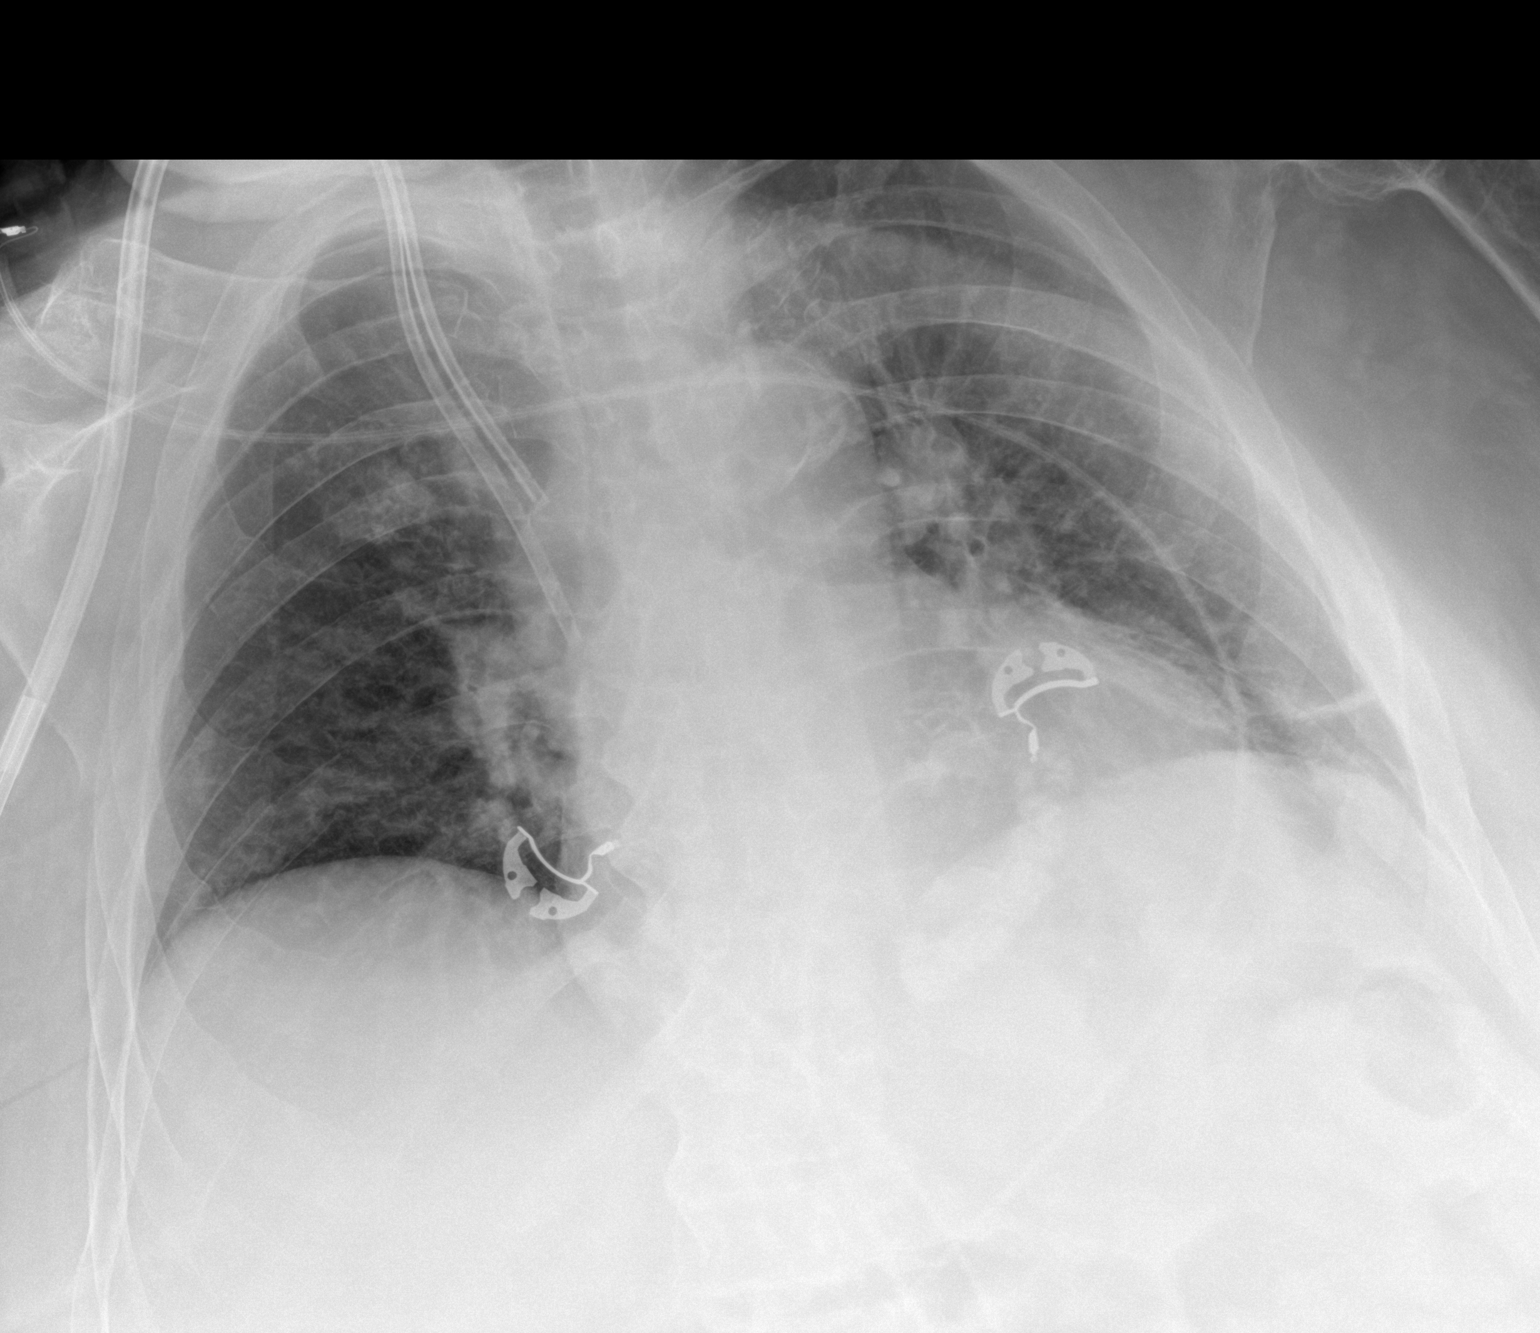

[2 of 2 positions shown; findings below may reference images not displayed]

FINDINGS: Right-sided central venous catheter with tips projecting over SVC
and SVC origin. Linear atelectasis left base. Dense mitral
calcification. Cardiomediastinal silhouette within normal limits.
Aortic atherosclerosis.
IMPRESSION: 1. Subsegmental atelectasis at the left base.
2. Right-sided central venous catheter with tips projecting over the
high and mid SVC.
